# Patient Record
Sex: Male | Born: 1955
Health system: Southern US, Community
[De-identification: ages and names within clinical notes are randomized; demographics above are authoritative.]

## PROBLEM LIST (undated history)

## (undated) ENCOUNTER — Emergency Department: Payer: 59

## (undated) DIAGNOSIS — Z8619 Personal history of other infectious and parasitic diseases: Secondary | ICD-10-CM

## (undated) DIAGNOSIS — I10 Essential (primary) hypertension: Secondary | ICD-10-CM

## (undated) DIAGNOSIS — I509 Heart failure, unspecified: Secondary | ICD-10-CM

## (undated) DIAGNOSIS — I209 Angina pectoris, unspecified: Secondary | ICD-10-CM

## (undated) DIAGNOSIS — R609 Edema, unspecified: Secondary | ICD-10-CM

## (undated) DIAGNOSIS — E785 Hyperlipidemia, unspecified: Secondary | ICD-10-CM

## (undated) DIAGNOSIS — K219 Gastro-esophageal reflux disease without esophagitis: Secondary | ICD-10-CM

## (undated) DIAGNOSIS — Z8701 Personal history of pneumonia (recurrent): Secondary | ICD-10-CM

## (undated) DIAGNOSIS — I639 Cerebral infarction, unspecified: Secondary | ICD-10-CM

## (undated) DIAGNOSIS — F410 Panic disorder [episodic paroxysmal anxiety] without agoraphobia: Secondary | ICD-10-CM

## (undated) DIAGNOSIS — G4733 Obstructive sleep apnea (adult) (pediatric): Secondary | ICD-10-CM

## (undated) DIAGNOSIS — I2510 Atherosclerotic heart disease of native coronary artery without angina pectoris: Secondary | ICD-10-CM

## (undated) DIAGNOSIS — I872 Venous insufficiency (chronic) (peripheral): Secondary | ICD-10-CM

## (undated) DIAGNOSIS — E119 Type 2 diabetes mellitus without complications: Secondary | ICD-10-CM

## (undated) DIAGNOSIS — I878 Other specified disorders of veins: Secondary | ICD-10-CM

## (undated) DIAGNOSIS — J449 Chronic obstructive pulmonary disease, unspecified: Secondary | ICD-10-CM

## (undated) DIAGNOSIS — L0390 Cellulitis, unspecified: Secondary | ICD-10-CM

## (undated) DIAGNOSIS — Z8719 Personal history of other diseases of the digestive system: Secondary | ICD-10-CM

## (undated) DIAGNOSIS — R52 Pain, unspecified: Secondary | ICD-10-CM

## (undated) DIAGNOSIS — R42 Dizziness and giddiness: Secondary | ICD-10-CM

## (undated) DIAGNOSIS — F419 Anxiety disorder, unspecified: Secondary | ICD-10-CM

## (undated) DIAGNOSIS — H3500 Unspecified background retinopathy: Secondary | ICD-10-CM

## (undated) DIAGNOSIS — J45909 Unspecified asthma, uncomplicated: Secondary | ICD-10-CM

## (undated) DIAGNOSIS — F4310 Post-traumatic stress disorder, unspecified: Secondary | ICD-10-CM

## (undated) DIAGNOSIS — I219 Acute myocardial infarction, unspecified: Secondary | ICD-10-CM

## (undated) DIAGNOSIS — Q390 Atresia of esophagus without fistula: Secondary | ICD-10-CM

## (undated) DIAGNOSIS — U071 COVID-19: Secondary | ICD-10-CM

## (undated) DIAGNOSIS — E669 Obesity, unspecified: Secondary | ICD-10-CM

## (undated) HISTORY — DX: Essential (primary) hypertension: I10

## (undated) HISTORY — DX: Obstructive sleep apnea (adult) (pediatric): G47.33

## (undated) HISTORY — PX: CATARACT EXTRACTION: SUR2

## (undated) HISTORY — DX: Panic disorder (episodic paroxysmal anxiety): F41.0

## (undated) HISTORY — DX: Gastro-esophageal reflux disease without esophagitis: K21.9

## (undated) HISTORY — DX: Personal history of other diseases of the digestive system: Z87.19

## (undated) HISTORY — DX: Personal history of pneumonia (recurrent): Z87.01

## (undated) HISTORY — DX: Hyperlipidemia, unspecified: E78.5

## (undated) HISTORY — PX: CORONARY ARTERY BYPASS GRAFT: SHX141

## (undated) HISTORY — DX: Atherosclerotic heart disease of native coronary artery without angina pectoris: I25.10

## (undated) HISTORY — DX: Chronic obstructive pulmonary disease, unspecified: J44.9

## (undated) HISTORY — DX: Anxiety disorder, unspecified: F41.9

## (undated) HISTORY — PX: CARDIAC CATHETERIZATION: SHX172

## (undated) HISTORY — DX: Cellulitis, unspecified: L03.90

## (undated) HISTORY — DX: Obesity, unspecified: E66.9

## (undated) HISTORY — DX: Personal history of other infectious and parasitic diseases: Z86.19

## (undated) HISTORY — DX: Acute myocardial infarction, unspecified: I21.9

## (undated) NOTE — *Deleted (*Deleted)
Reviewed chart for medication changes ahead of medication coordination call.  No OVs, Consults, or hospital visits since last care coordination call/Pharmacist visit.  No medication changes indicated.  BP Readings from Last 3 Encounters:  04/08/20 128/86  03/31/20 (!) 172/96  03/15/20 (!) 150/86    Lab Results  Component Value Date   HGBA1C 6.2 04/08/2020     Recent Relevant Labs: Lab Results  Component Value Date/Time   HGBA1C 6.2 04/08/2020 09:18 AM   HGBA1C 6.3 12/10/2019 08:41 AM   MICROALBUR 80 (H) 09/09/2019 10:47 AM    Kidney Function Lab Results  Component Value Date/Time   CREATININE 0.84 04/08/2020 09:25 AM   CREATININE 0.83 03/01/2020 05:58 PM   CREATININE 1.20 11/29/2014 11:20 AM   CREATININE 1.03 08/27/2014 08:50 AM   GFR 98.05 09/12/2013 10:35 AM   GFRNONAA 92 04/08/2020 09:25 AM   GFRNONAA >60 11/29/2014 11:20 AM   GFRAA 106 04/08/2020 09:25 AM   GFRAA >60 11/29/2014 11:20 AM    . Current antihyperglycemic regimen:  o Blood Glucose Monitoring: Instructed to check blood sugars three to four times daily.  . What recent interventions/DTPs have been made to improve glycemic control:  o *** . Have there been any recent hospitalizations or ED visits since last visit with CPP? {yes/no:20286} . Patient {reports/denies:24182} hypoglycemic symptoms, including {Hypoglycemic Symptoms:3049003} . Patient {reports/denies:24182} hyperglycemic symptoms, including {symptoms; hyperglycemia:17903} . How often are you checking your blood sugar? {BG Testing frequency:23922} . What are your blood sugars ranging?  o Fasting: *** o Before meals: *** o After meals: *** o Bedtime: *** . During the week, how often does your blood glucose drop below 70? {LowBGfrequency:24142} . Are you checking your feet daily/regularly?   Adherence Review: Is the patient currently on a STATIN medication? {yes/no:20286} Is the patient currently on ACE/ARB medication? {yes/no:20286} Does the  patient have >5 day gap between last estimated fill dates? {yes/no:20286}  Blood Pressure 145/90  Dizziness has improved since medication change.  Does report random dizziness but cannot explain a detailed time.  Per patient he has a pulse ox and if his pulse is 80 or below will not check blood pressure. But reporting he does check his blood pressure at least once weekly.  Patient was not on track this morning. Rambling. Could not redirect patient. Continued to explain someone called to attempt to "steal his identity".  Could not give me a phone number or the name of the person who called. The details that were given were scattered. Hard to follow. Instructed patient to never give out any personal information such as social security number. Patient verbalized understanding to information given.   Caregiver comes into the home for two and 1/2 hours daily. She assists with the patients errands or to MD appointments. Reported she does assist home chores such as laundry and cleaning the home.   Continues to say over and over about Hansel Starling the singer. Reported he met Hansel Starling through [employer] Sp[employer]. Explained all of these famous people are related to him ex: calling them cousins.   Patient refuses to go any "mental health" providers because he is not "crazy". Asked patient if he would like someone to call him weekly so he could "vent" to them. He stated " no, I get by with the little help of my friends." Patient is not open to any mental health services.    Reviewed chart prior to disease state call. Spoke with patient regarding BP  Recent Office Vitals: BP Readings  from Last 3 Encounters:  04/08/20 128/86  03/31/20 (!) 172/96  03/15/20 (!) 150/86   Pulse Readings from Last 3 Encounters:  04/08/20 98  03/31/20 64  03/15/20 91    Wt Readings from Last 3 Encounters:  04/08/20 233 lb (105.7 kg)  03/31/20 229 lb 6 oz (104 kg)  03/15/20 230 lb (104.3 kg)     Kidney Function Lab  Results  Component Value Date/Time   CREATININE 0.84 04/08/2020 09:25 AM   CREATININE 0.83 03/01/2020 05:58 PM   CREATININE 1.20 11/29/2014 11:20 AM   CREATININE 1.03 08/27/2014 08:50 AM   GFR 98.05 09/12/2013 10:35 AM   GFRNONAA 92 04/08/2020 09:25 AM   GFRNONAA >60 11/29/2014 11:20 AM   GFRAA 106 04/08/2020 09:25 AM   GFRAA >60 11/29/2014 11:20 AM    BMP Latest Ref Rng & Units 04/08/2020 03/01/2020 12/10/2019  Glucose 65 - 99 mg/dL 161(W) 960(A) 540(J)  BUN 8 - 27 mg/dL 17 15 13   Creatinine 0.76 - 1.27 mg/dL 8.11 9.14 7.82  BUN/Creat Ratio 10 - 24 20 - 16  Sodium 134 - 144 mmol/L 138 137 143  Potassium 3.5 - 5.2 mmol/L 5.3(H) 4.0 4.1  Chloride 96 - 106 mmol/L 102 104 108(H)  CO2 20 - 29 mmol/L 21 22 20   Calcium 8.6 - 10.2 mg/dL 9.6 9.2 9.6    . Current antihypertensive regimen:  o *** . How often are you checking your Blood Pressure? {CHL HP BP Monitoring Frequency:830 [id number] 0486} . Current home BP readings: *** . What recent interventions/DTPs have been made by any provider to improve Blood Pressure control since last CPP Visit: *** . Any recent hospitalizations or ED visits since last visit with CPP? {yes/no:20286} . What diet changes have been made to improve Blood Pressure Control?  o *** . What exercise is being done to improve your Blood Pressure Control?  o ***  Adherence Review: Is the patient currently on ACE/ARB medication? {yes/no:20286} Does the patient have >5 day gap between last estimated fill dates? {yes/no:20286}   Amanda L Caudle Amanda L Caudle

---

## 1997-08-07 HISTORY — PX: CORONARY ANGIOPLASTY WITH STENT PLACEMENT: SHX49

## 1999-03-18 ENCOUNTER — Emergency Department (HOSPITAL_COMMUNITY): Admission: EM | Admit: 1999-03-18 | Discharge: 1999-03-18 | Payer: Self-pay | Admitting: *Deleted

## 1999-05-03 ENCOUNTER — Encounter: Payer: Self-pay | Admitting: Cardiology

## 1999-05-03 ENCOUNTER — Emergency Department (HOSPITAL_COMMUNITY): Admission: EM | Admit: 1999-05-03 | Discharge: 1999-05-03 | Payer: Self-pay | Admitting: Emergency Medicine

## 2000-02-22 ENCOUNTER — Emergency Department (HOSPITAL_COMMUNITY): Admission: EM | Admit: 2000-02-22 | Discharge: 2000-02-22 | Payer: Self-pay | Admitting: Emergency Medicine

## 2000-02-22 ENCOUNTER — Encounter: Payer: Self-pay | Admitting: Emergency Medicine

## 2000-08-07 HISTORY — PX: CORONARY ANGIOPLASTY WITH STENT PLACEMENT: SHX49

## 2000-08-14 ENCOUNTER — Encounter: Payer: Self-pay | Admitting: Emergency Medicine

## 2000-08-14 ENCOUNTER — Emergency Department (HOSPITAL_COMMUNITY): Admission: EM | Admit: 2000-08-14 | Discharge: 2000-08-14 | Payer: Self-pay | Admitting: Emergency Medicine

## 2000-09-21 ENCOUNTER — Inpatient Hospital Stay (HOSPITAL_COMMUNITY): Admission: EM | Admit: 2000-09-21 | Discharge: 2000-09-22 | Payer: Self-pay | Admitting: Emergency Medicine

## 2000-09-22 ENCOUNTER — Encounter: Payer: Self-pay | Admitting: *Deleted

## 2000-09-22 ENCOUNTER — Observation Stay (HOSPITAL_COMMUNITY): Admission: EM | Admit: 2000-09-22 | Discharge: 2000-09-24 | Payer: Self-pay | Admitting: *Deleted

## 2001-07-18 ENCOUNTER — Encounter: Payer: Self-pay | Admitting: Emergency Medicine

## 2001-07-18 ENCOUNTER — Inpatient Hospital Stay (HOSPITAL_COMMUNITY): Admission: EM | Admit: 2001-07-18 | Discharge: 2001-07-19 | Payer: Self-pay | Admitting: Emergency Medicine

## 2001-08-20 ENCOUNTER — Emergency Department (HOSPITAL_COMMUNITY): Admission: EM | Admit: 2001-08-20 | Discharge: 2001-08-20 | Payer: Self-pay

## 2001-08-20 ENCOUNTER — Encounter: Payer: Self-pay | Admitting: Emergency Medicine

## 2002-04-14 ENCOUNTER — Emergency Department (HOSPITAL_COMMUNITY): Admission: EM | Admit: 2002-04-14 | Discharge: 2002-04-14 | Payer: Self-pay | Admitting: *Deleted

## 2002-04-14 ENCOUNTER — Encounter: Payer: Self-pay | Admitting: *Deleted

## 2003-04-02 ENCOUNTER — Emergency Department (HOSPITAL_COMMUNITY): Admission: AD | Admit: 2003-04-02 | Discharge: 2003-04-02 | Payer: Self-pay | Admitting: Emergency Medicine

## 2004-03-29 ENCOUNTER — Other Ambulatory Visit: Payer: Self-pay

## 2005-03-29 ENCOUNTER — Ambulatory Visit: Payer: Self-pay | Admitting: Cardiology

## 2005-06-20 ENCOUNTER — Emergency Department: Payer: Self-pay | Admitting: Internal Medicine

## 2005-06-20 ENCOUNTER — Other Ambulatory Visit: Payer: Self-pay

## 2005-06-20 IMAGING — CR DG CHEST 1V PORT
1 series · 1 of 1 positions shown · non-contrast
Comparison: none

REASON FOR EXAM: Chest pain
COMMENTS:

PROCEDURE:     DXR - DXR PORTABLE CHEST SINGLE VIEW  - [DATE]  [DATE]
RESULT:          AP view of the chest is compared to a prior exam of [DATE].
The lung fields are clear.  No pneumonia, pneumothorax, or pleural effusion
is seen.  Heart size is normal.

[view not recorded]
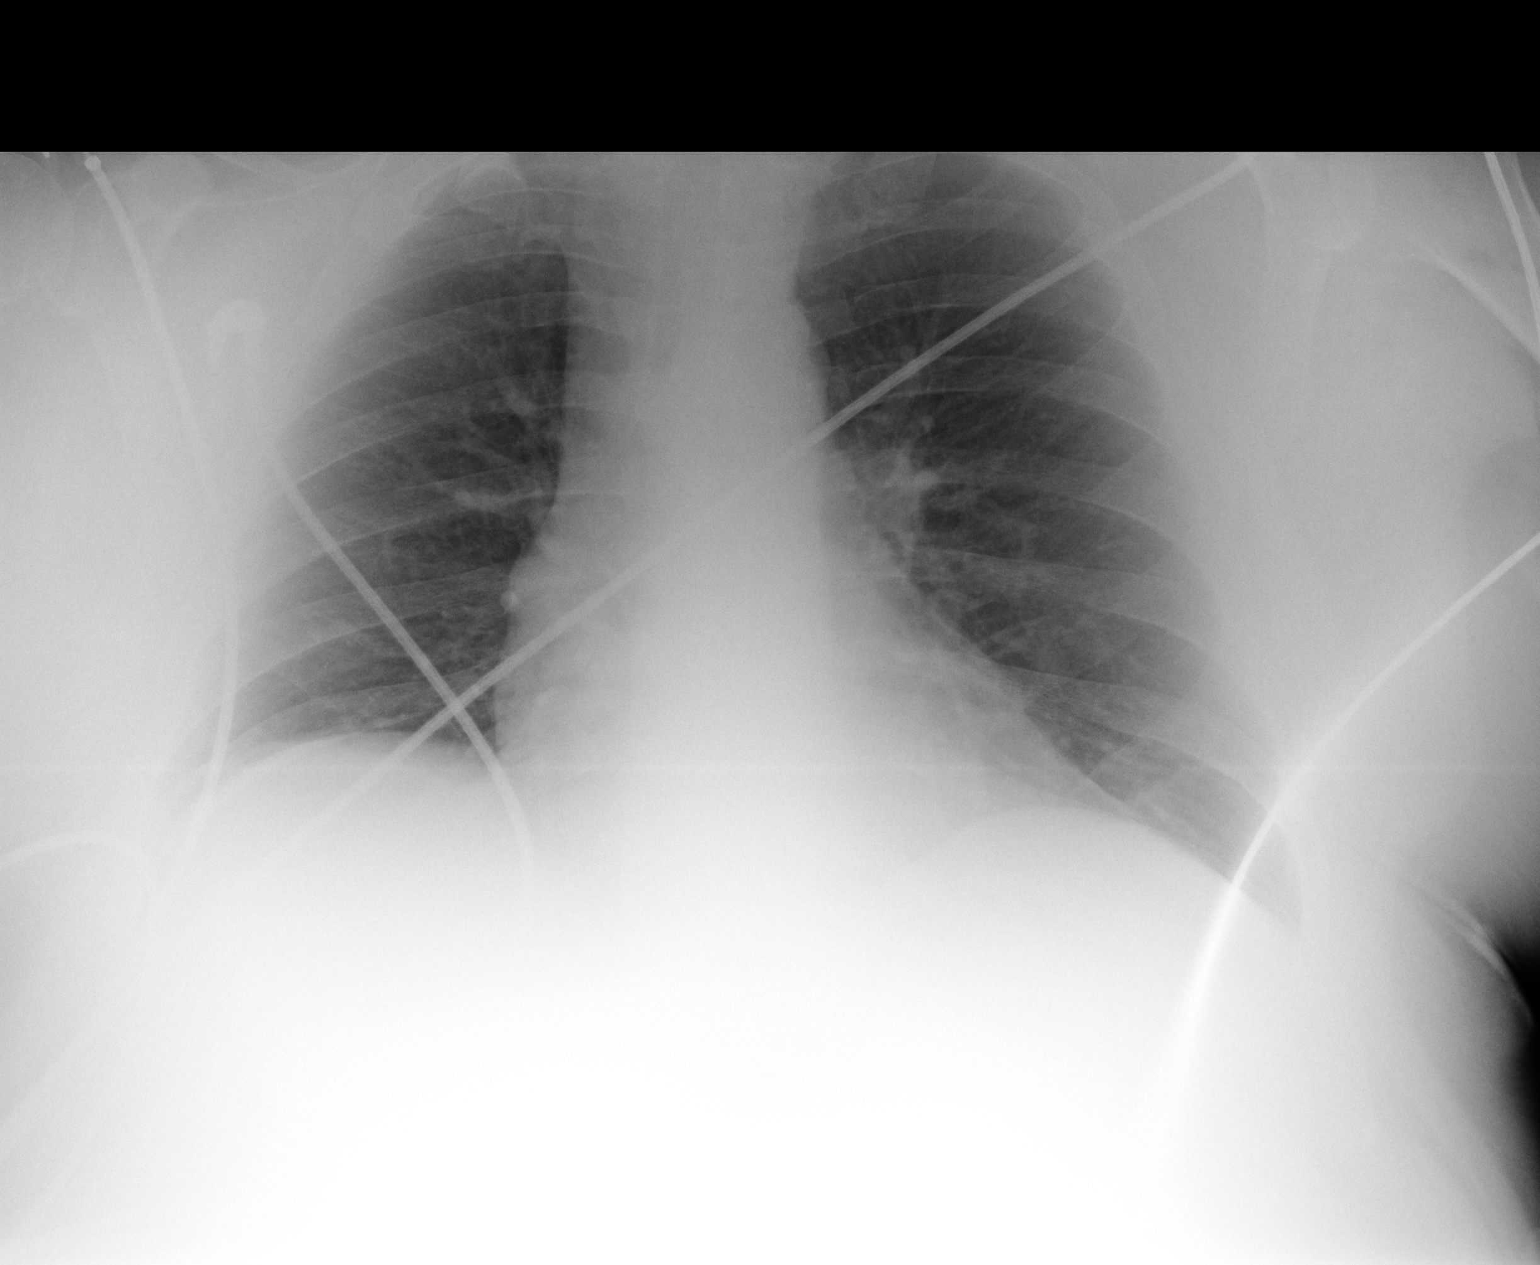

[1 of 1 positions shown; findings below may reference images not displayed]

IMPRESSION: No acute changes are identified.

## 2005-11-30 ENCOUNTER — Other Ambulatory Visit: Payer: Self-pay

## 2005-11-30 ENCOUNTER — Emergency Department: Payer: Self-pay | Admitting: Emergency Medicine

## 2005-11-30 IMAGING — CR DG CHEST 2V
1 series · 2 of 2 positions shown · non-contrast
Comparison: none

REASON FOR EXAM: Shortness of breath
COMMENTS:

PROCEDURE:     DXR - DXR CHEST PA (OR AP) AND LATERAL  - [DATE]  [DATE]
RESULT:        The lungs are adequately inflated.  There is no focal
infiltrate.  The interstitial markings are mildly prominent.  The heart is
not enlarged and there is no pleural effusion.

[Series 1: view not recorded · 0.17mm/px · 2 of 2 slices shown]
[im 1/2]
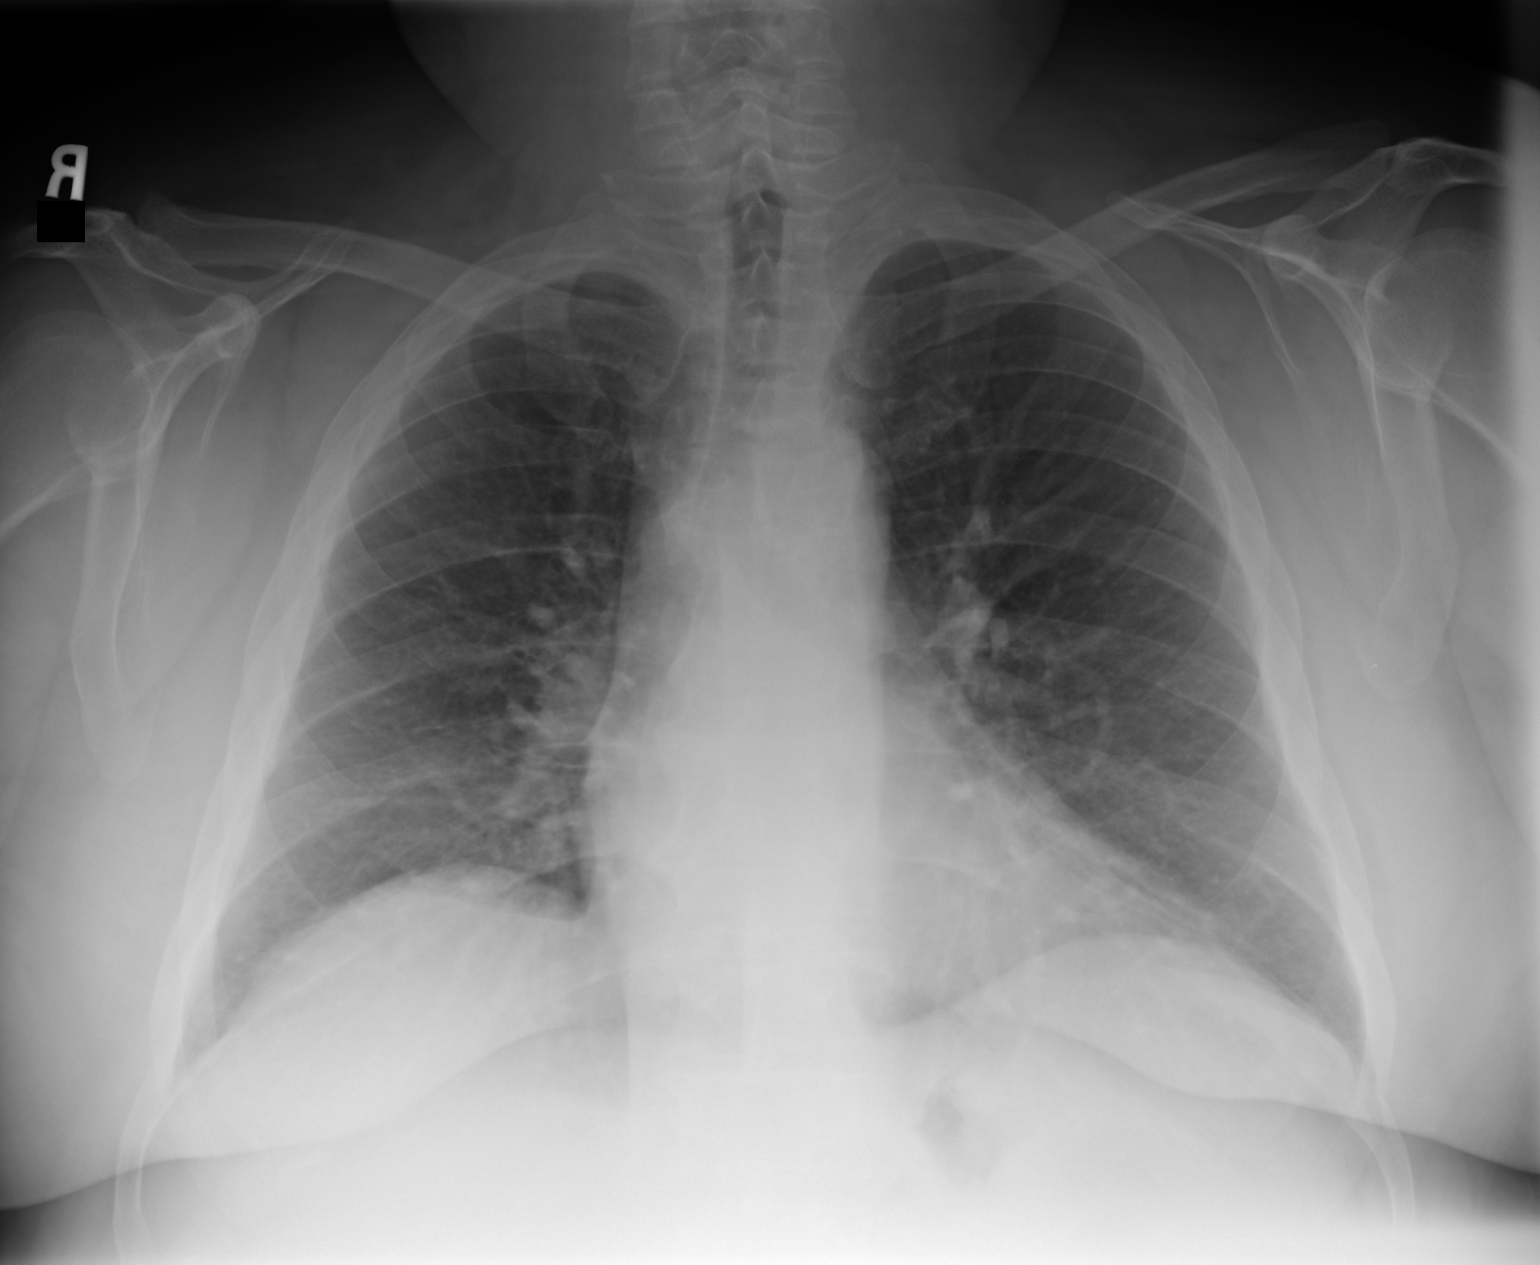
[im 2/2]
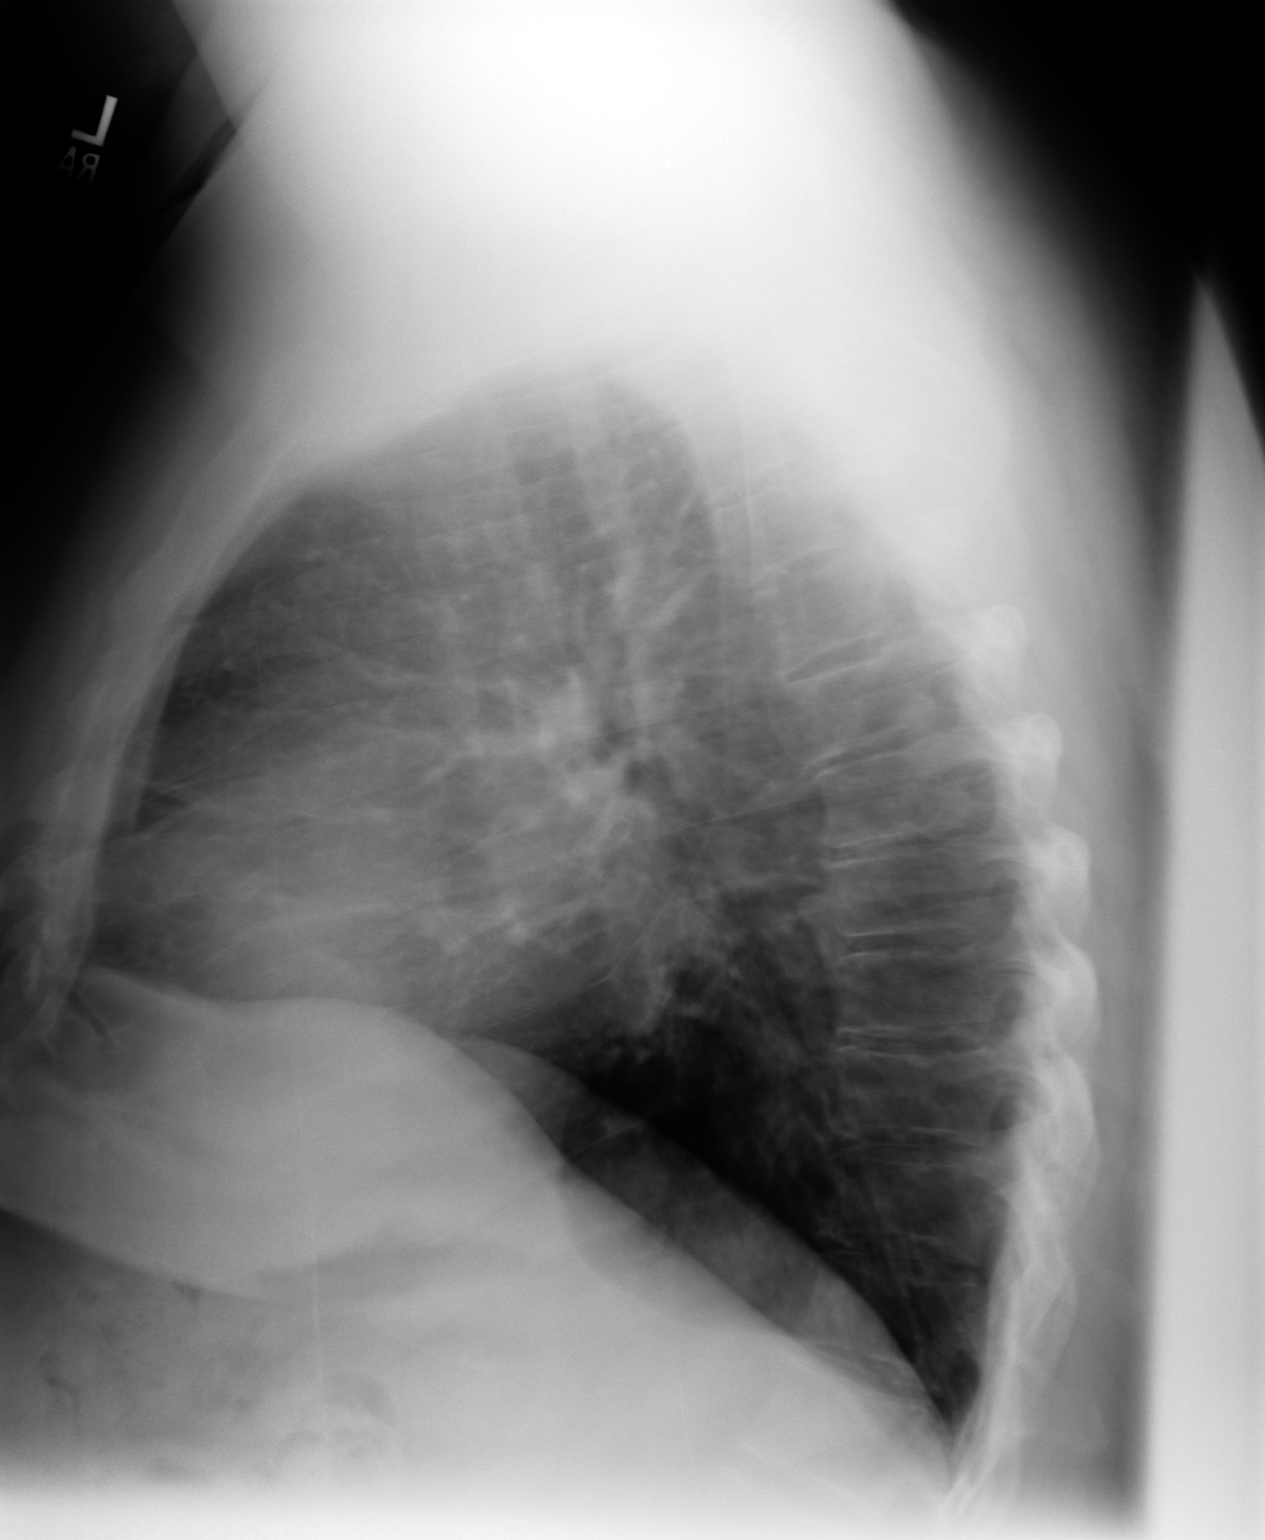

[2 of 2 positions shown; findings below may reference images not displayed]

IMPRESSION: I do not see evidence of acute cardiopulmonary disease.  I
cannot exclude an element of acute bronchitis.

## 2005-12-06 ENCOUNTER — Emergency Department: Payer: Self-pay | Admitting: Emergency Medicine

## 2006-05-25 ENCOUNTER — Ambulatory Visit: Payer: Self-pay | Admitting: Cardiology

## 2006-06-11 ENCOUNTER — Emergency Department: Payer: Self-pay | Admitting: Emergency Medicine

## 2006-06-11 ENCOUNTER — Other Ambulatory Visit: Payer: Self-pay

## 2006-06-11 IMAGING — CT CT HEAD WITHOUT CONTRAST
2 series · 16 of 30 positions shown, 20 images · non-contrast
Comparison: none

REASON FOR EXAM: syncope//rm 18
COMMENTS:  LMP: (Male)

[Series 2: without · axial · non-contrast · 0.40mm/px · z∈[+106,+232]mm · 13 of 31 slices shown, 17 images]
[im 3/31  brain]
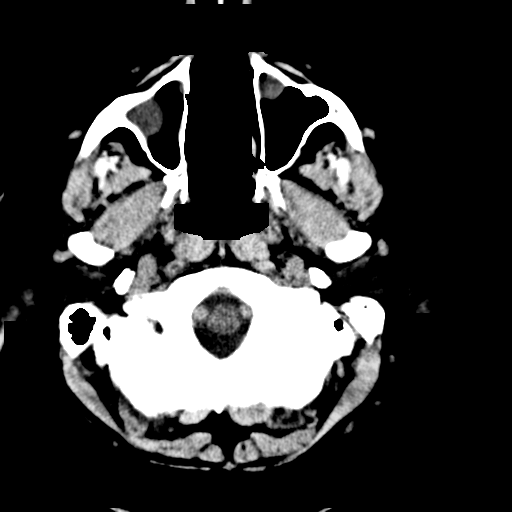
[im 3/31  bone]
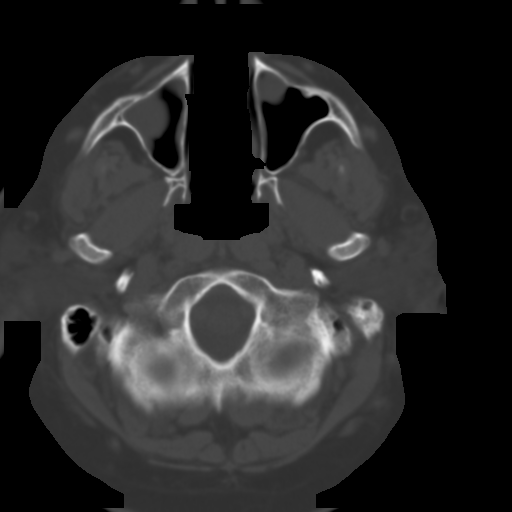
[im 5/31  brain]
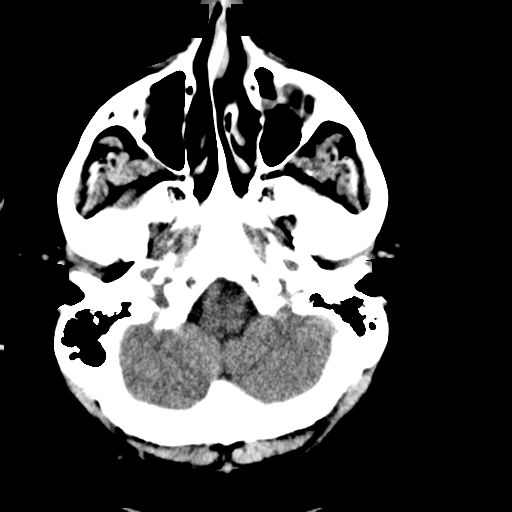
[im 7/31  brain]
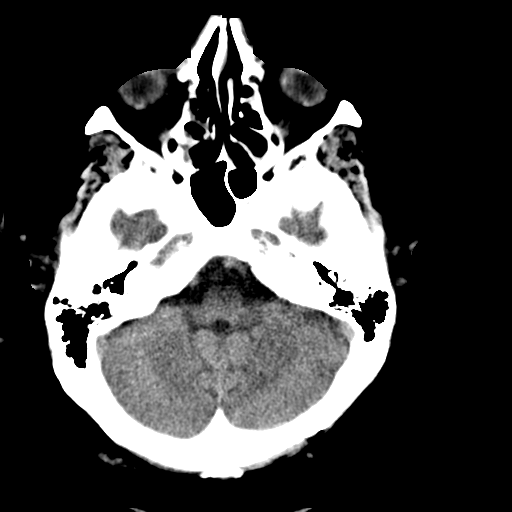
[im 9/31  brain]
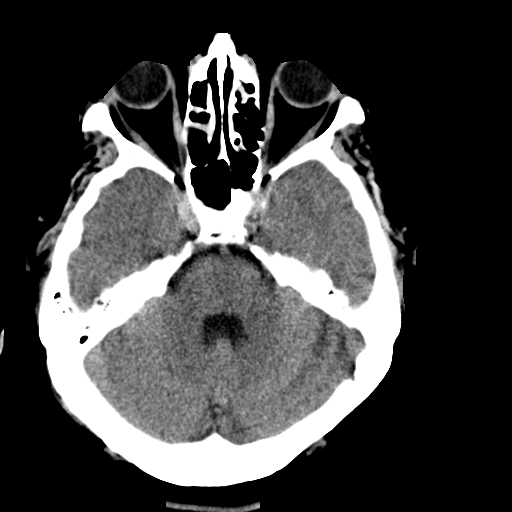
[im 11/31  brain]
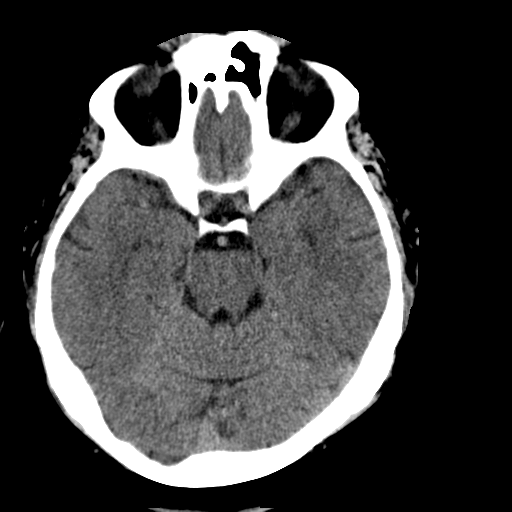
[im 11/31  bone]
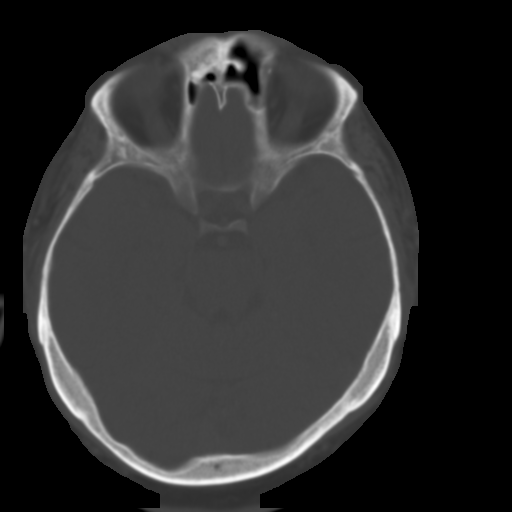
[im 13/31  brain]
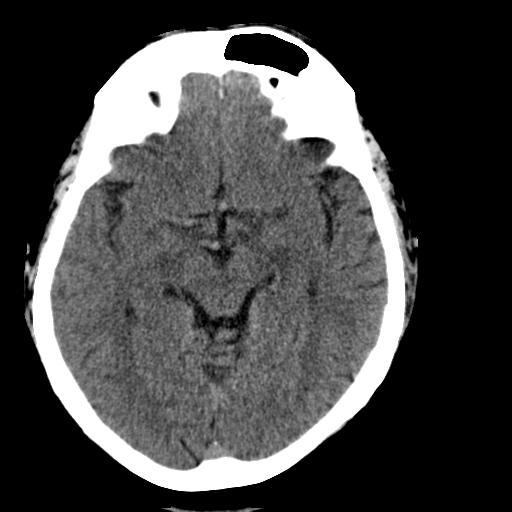
[im 16/31  brain]
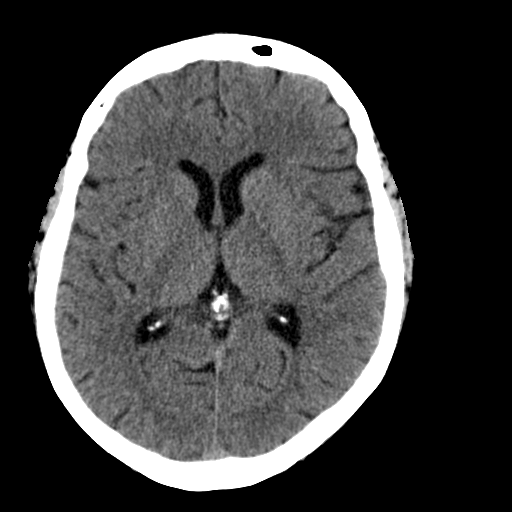
[im 18/31  brain]
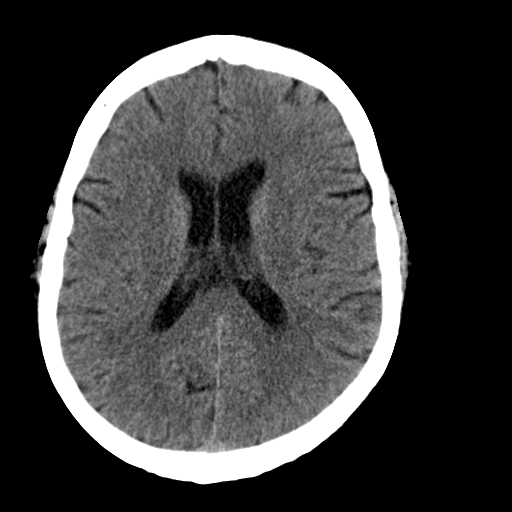
[im 20/31  brain]
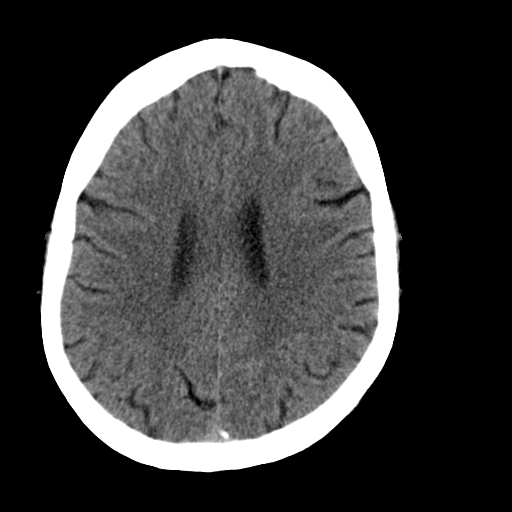
[im 20/31  bone]
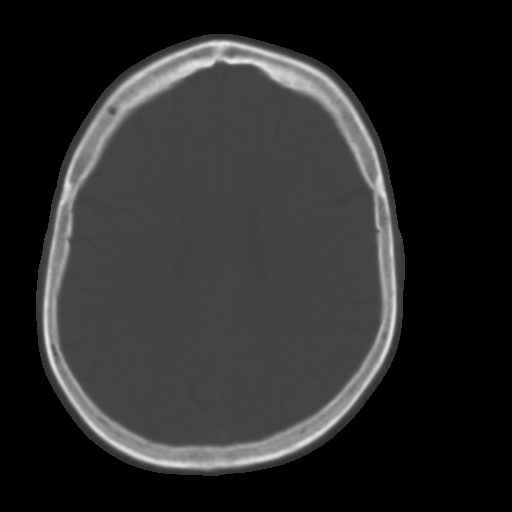
[im 22/31  brain]
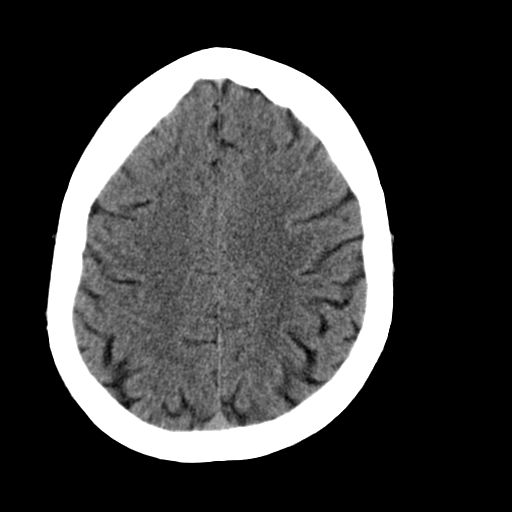
[im 24/31  brain]
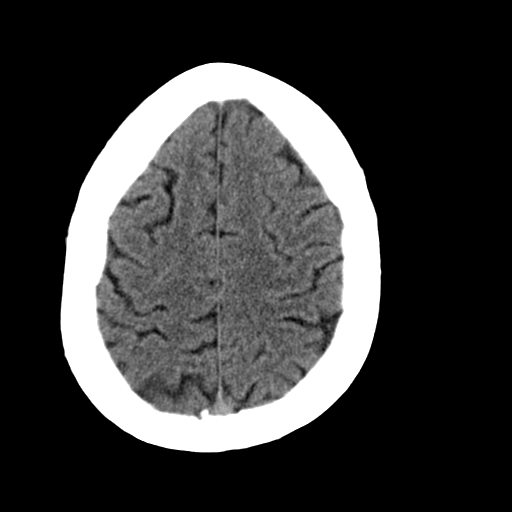
[im 26/31  brain]
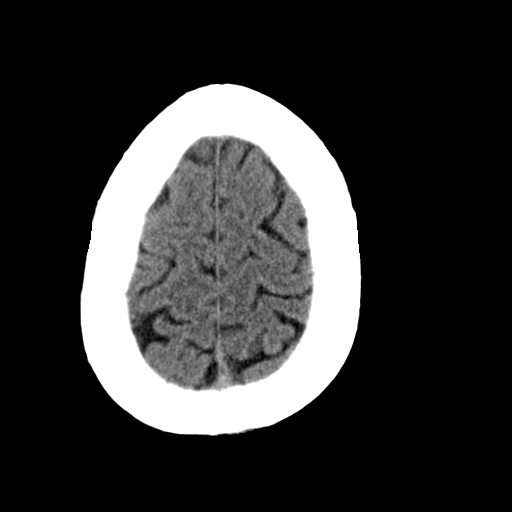
[im 28/31  brain]
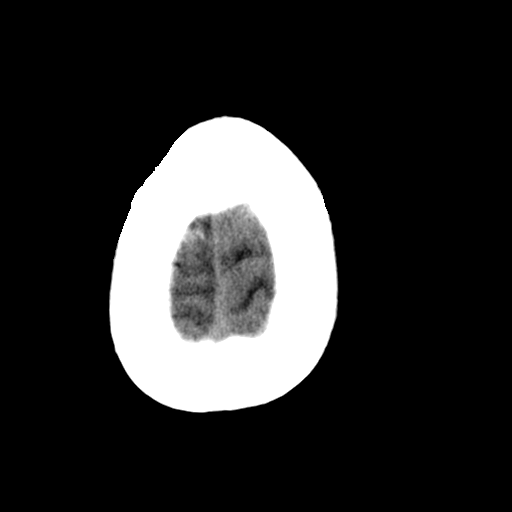
[im 28/31  bone]
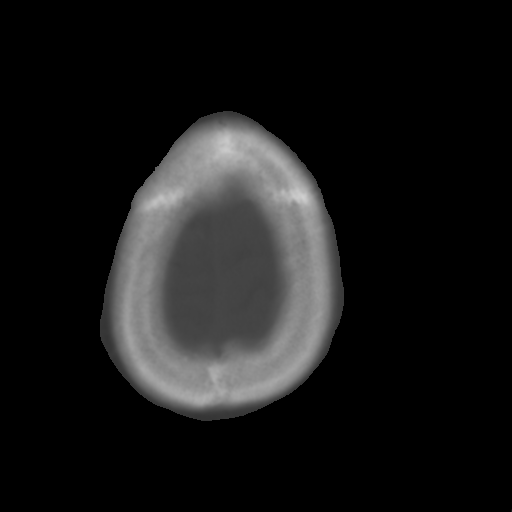

[Series 3: bone · axial · 0.40mm/px · z∈[+106,+146]mm · 3 of 31 slices shown]
[im 3/31  bone]
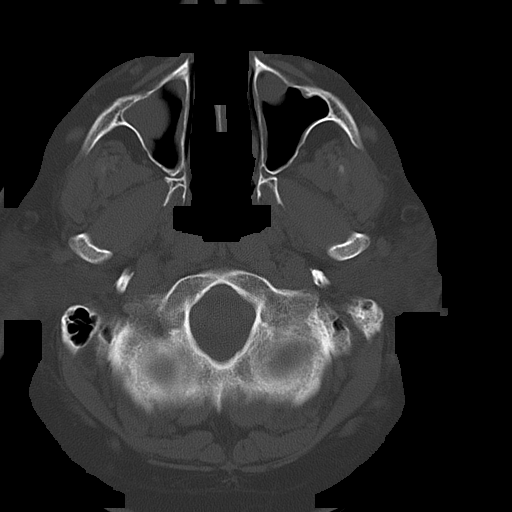
[im 7/31  bone]
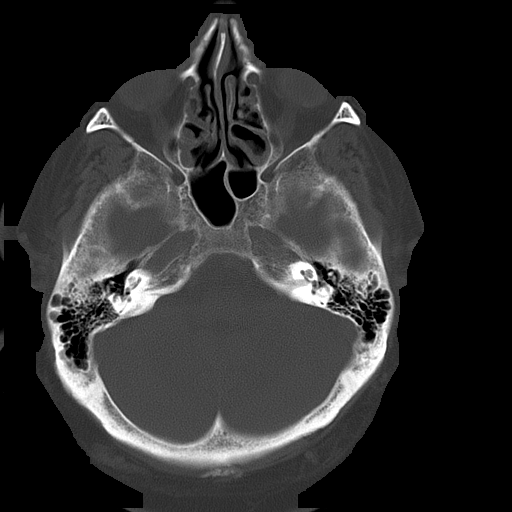
[im 11/31  bone]
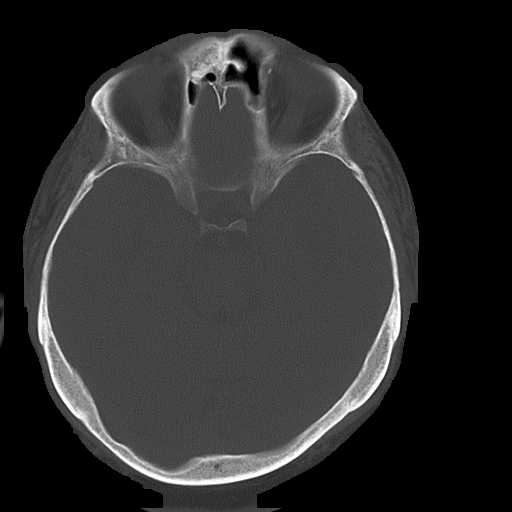

[16 of 30 positions shown; findings below may reference images not displayed]

PROCEDURE:     CT  - CT HEAD WITHOUT CONTRAST  - [DATE] [DATE]

RESULT:     The patient has unexplained dizziness.

The ventricles are normal in size and position. There is no intracranial
hemorrhage, mass, or mass-effect. At bone window settings there is some
mucoperiosteal thickening within the maxillary and ethmoid sinuses
bilaterally. I see no air-fluid levels. There is no lytic or blastic bony
lesion and no evidence of an acute skull fracture.
IMPRESSION: I do not see acute intracranial abnormality. There are
findings that likely reflect maxillary and ethmoid sinus inflammation.

## 2006-06-19 ENCOUNTER — Ambulatory Visit: Payer: Self-pay | Admitting: Cardiology

## 2006-07-23 ENCOUNTER — Emergency Department: Payer: Self-pay | Admitting: Emergency Medicine

## 2006-09-21 IMAGING — CR DG CHEST 1V PORT
1 series · 1 of 1 positions shown · non-contrast
Comparison: none

REASON FOR EXAM: Syncope//rm 18
COMMENTS:  LMP: (Male)

PROCEDURE:     DXR - DXR PORTABLE CHEST SINGLE VIEW  - [DATE] [DATE]
RESULT:        AP view of the chest shows the lung fields to be clear.  No
definite abnormalities of the heart, lungs or mediastinal structures are
identified.  Monitoring electrodes are present.

[view not recorded]
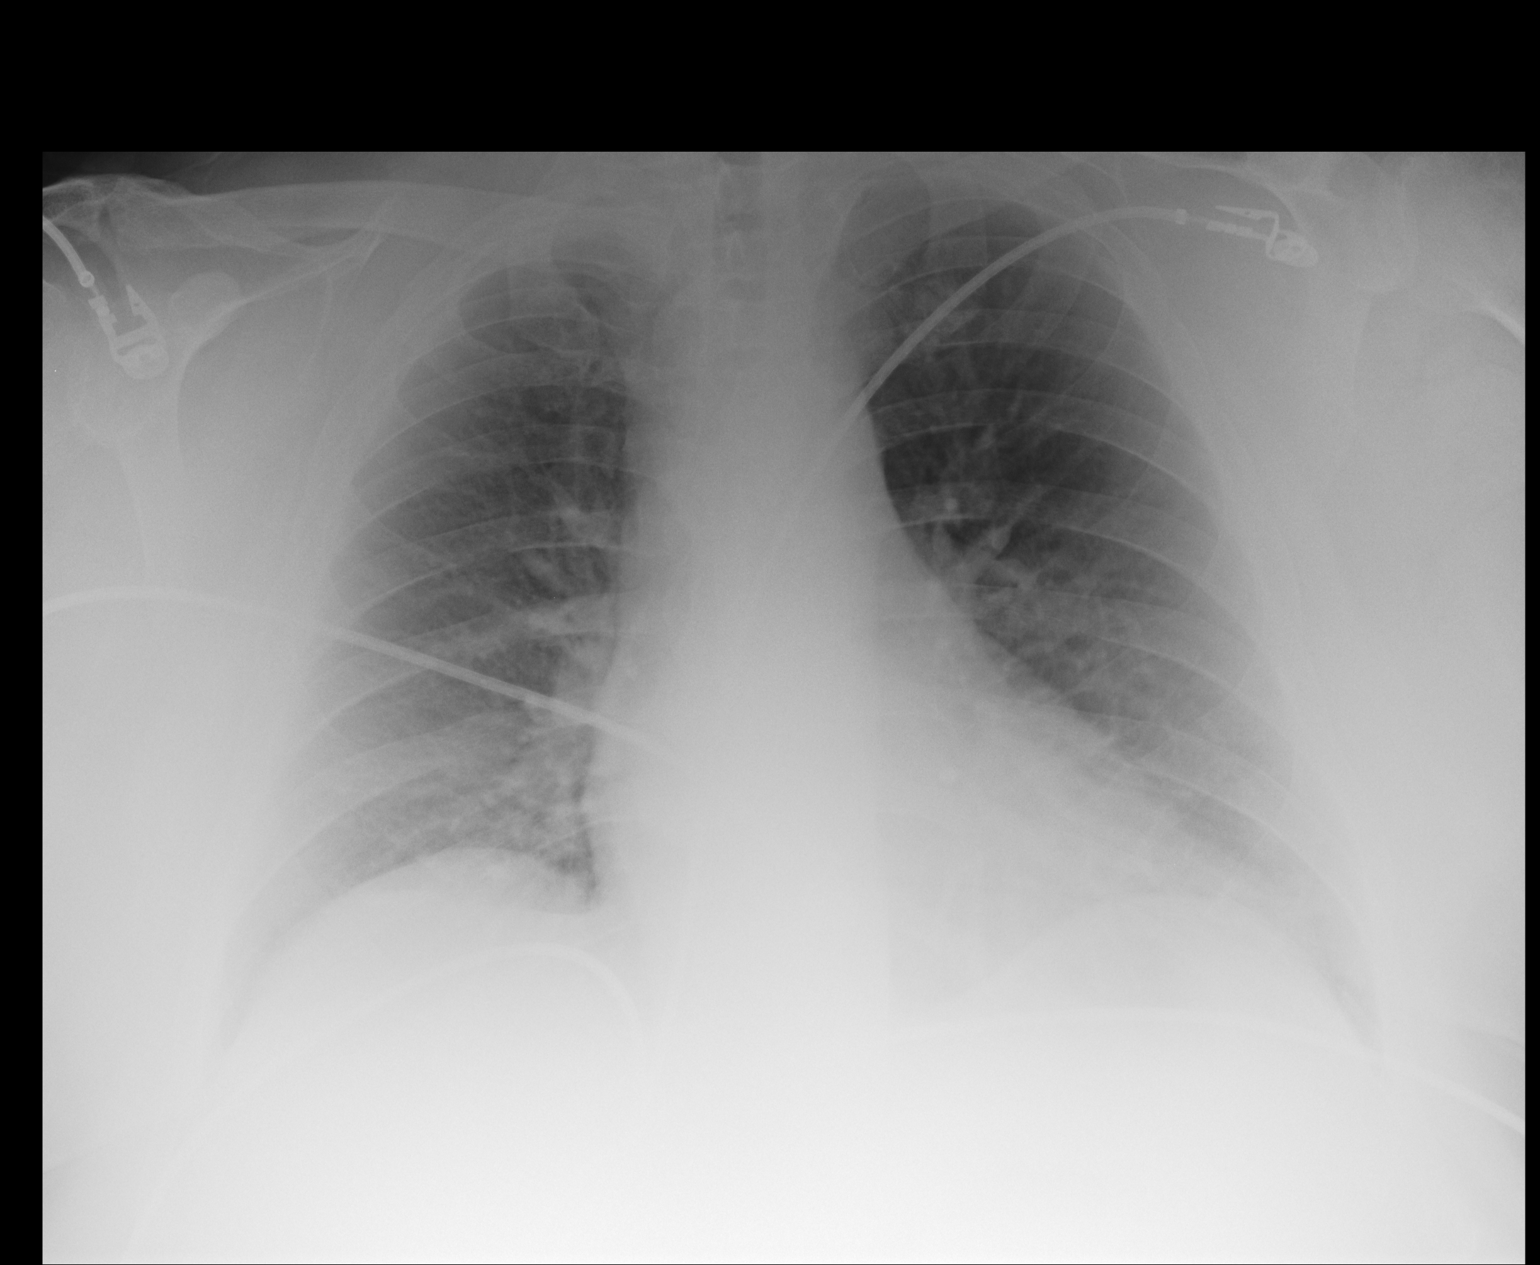

[1 of 1 positions shown; findings below may reference images not displayed]

IMPRESSION: No acute changes are identified.

## 2007-01-09 ENCOUNTER — Other Ambulatory Visit: Payer: Self-pay

## 2007-01-09 ENCOUNTER — Emergency Department: Payer: Self-pay | Admitting: Emergency Medicine

## 2007-01-09 IMAGING — CR DG CHEST 1V PORT
1 series · 1 of 1 positions shown · non-contrast
Comparison: none

REASON FOR EXAM: Chest Pain
COMMENTS:

[view not recorded]
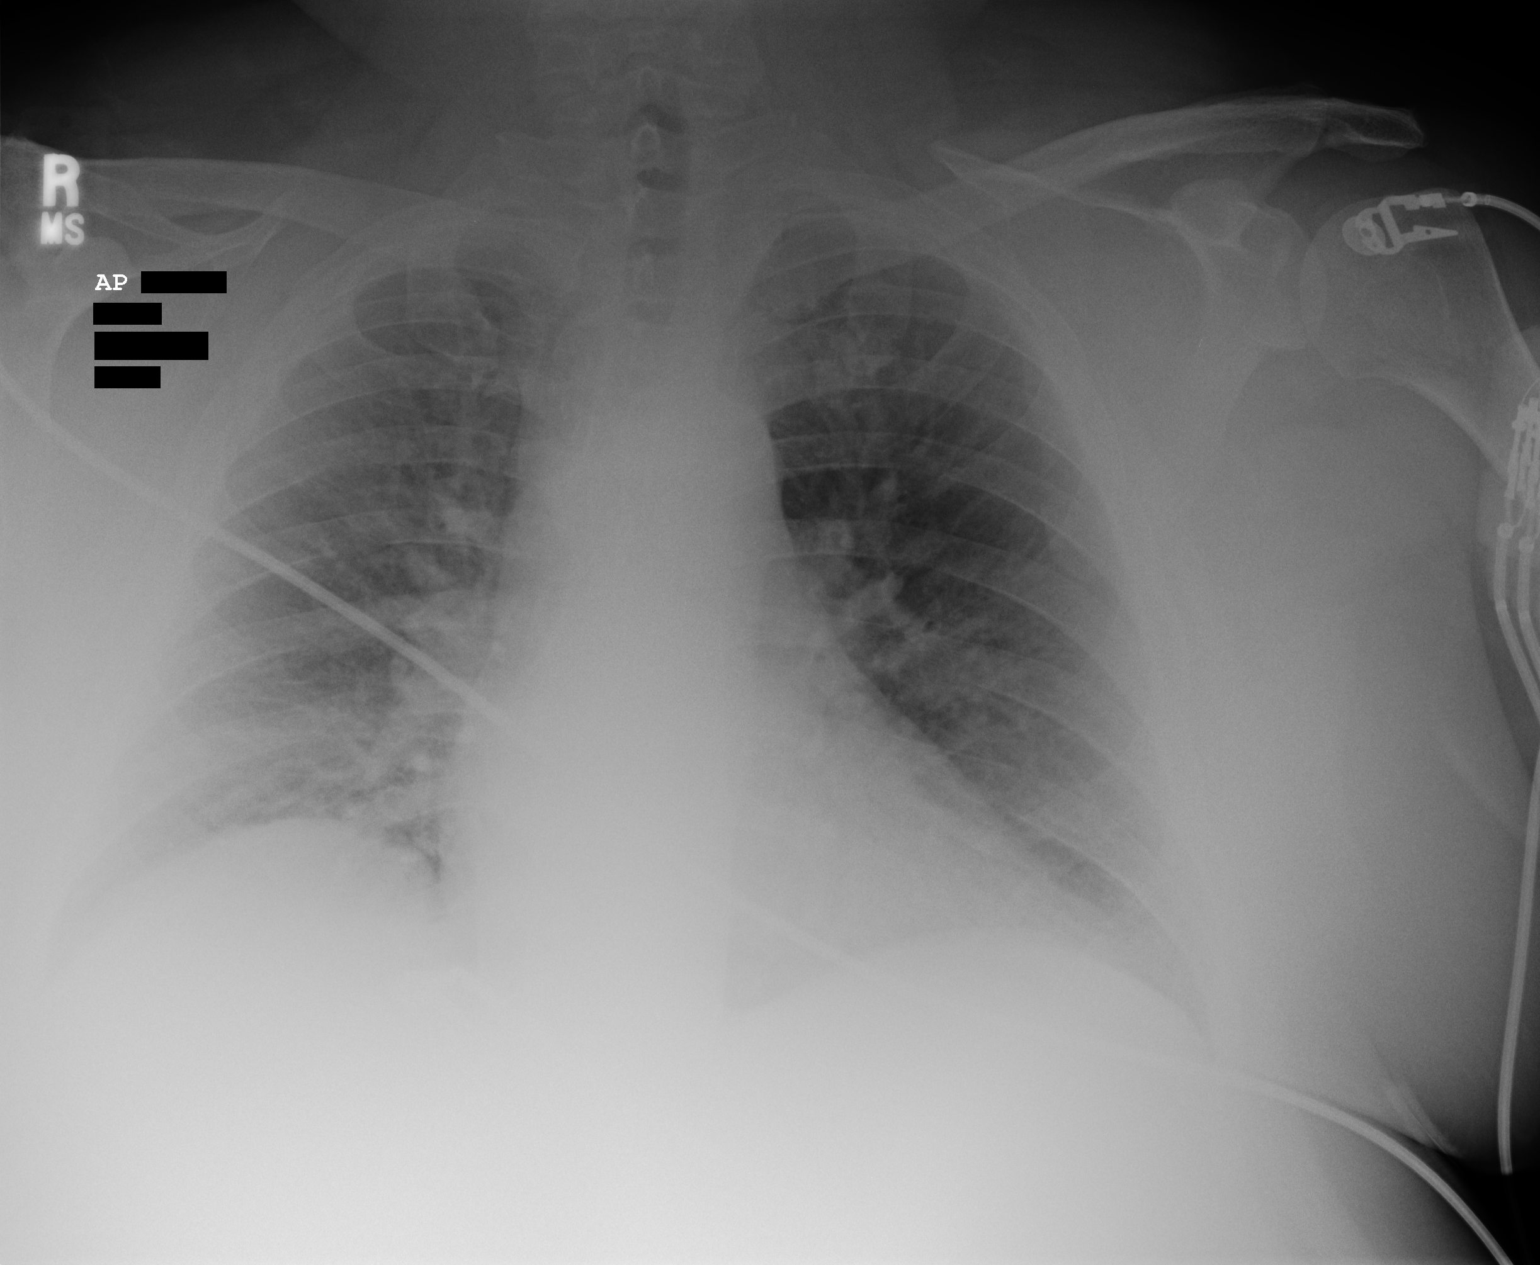

[1 of 1 positions shown; findings below may reference images not displayed]

PROCEDURE:     DXR - DXR PORTABLE CHEST SINGLE VIEW  - [DATE] [DATE]

RESULT:     Comparison is made to the prior exam of [DATE]. The lung
fields are clear. No acute changes of the heart, mediastinal or osseous
structures are identified. The heart size remains normal. Monitoring
electrodes are present.
IMPRESSION: No acute changes are identified.

## 2007-03-16 ENCOUNTER — Inpatient Hospital Stay: Payer: Self-pay | Admitting: Internal Medicine

## 2007-03-16 ENCOUNTER — Ambulatory Visit: Payer: Self-pay | Admitting: Cardiology

## 2007-03-16 ENCOUNTER — Other Ambulatory Visit: Payer: Self-pay

## 2007-03-16 IMAGING — CT CT CHEST-ABD-PELV W/ CM
1 of 3 series · 14 of 29 positions shown, 19 images · IV contrast (APPLIED)
Comparison: none

REASON FOR EXAM: (1) IV CONTRAST ONLY; (2) CP RAD INTO BACK; EVAL
DISSECTION
COMMENTS:

PROCEDURE:     CT  - CT CHEST ABDOMEN AND PELVIS W  - [DATE]  [DATE]
RESULT:     Comparison: Chest radiograph on [DATE].
TECHNIQUE: CT examination of the chest, abdomen and pelvis was performed
after intravenous administration of 125 cc of [GY] nonionic contrast
in addition to oral contrast. Collimation is 3 mm.

[Series 6: soft tissue · axial · 0.96mm/px · z∈[-346,+188]mm · 14 of 202 slices shown, 19 images]
[im 12/202  mediastinal]
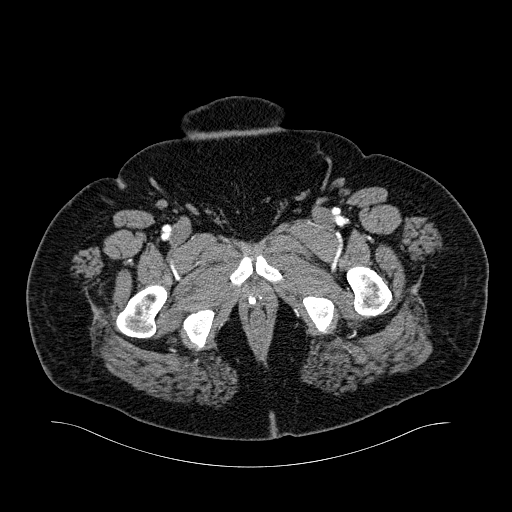
[im 12/202  bone]
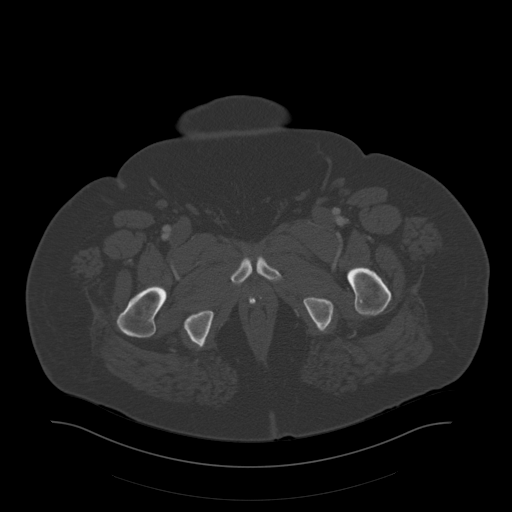
[im 34/202  mediastinal]
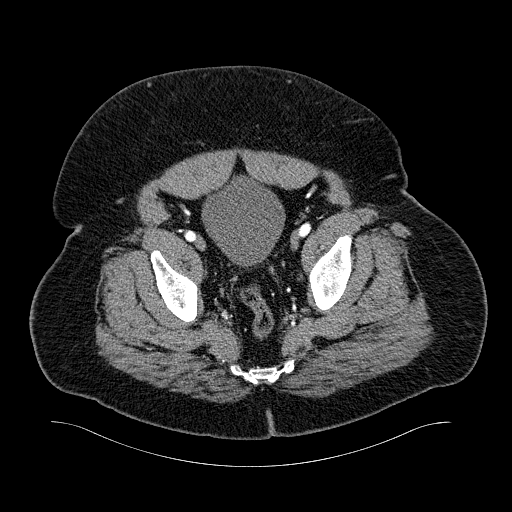
[im 45/202  mediastinal]
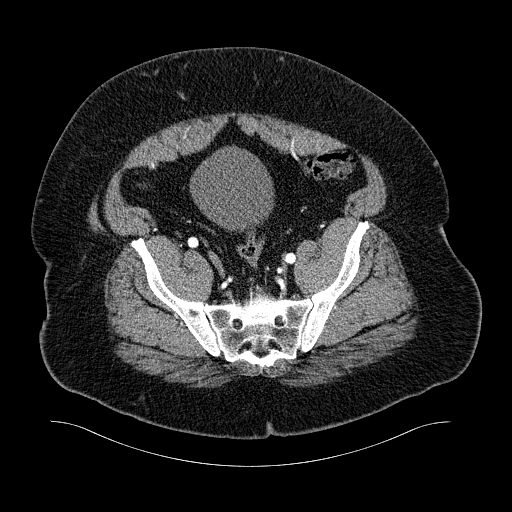
[im 56/202  mediastinal]
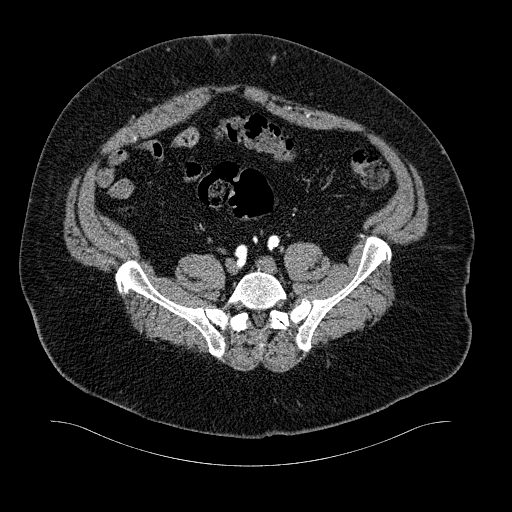
[im 79/202  mediastinal]
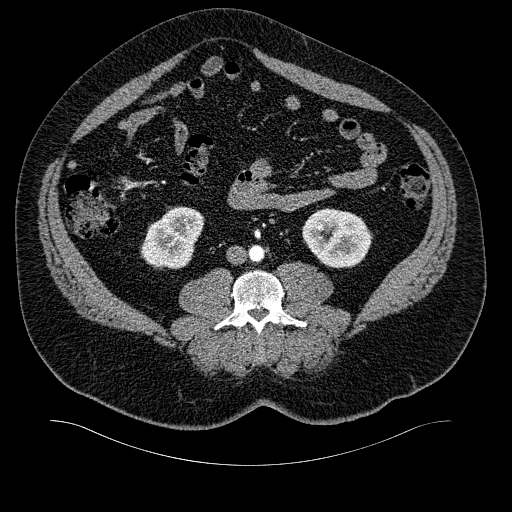
[im 90/202  mediastinal]
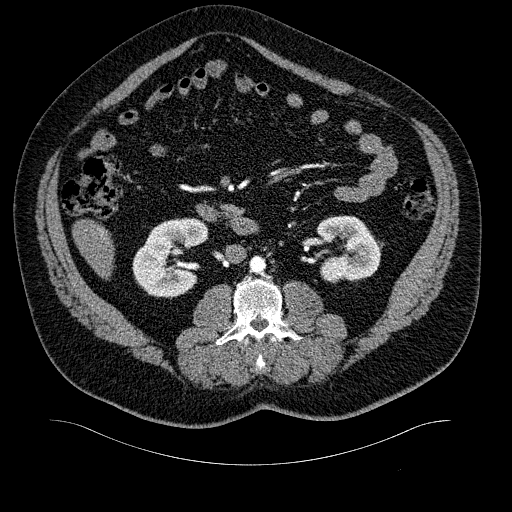
[im 101/202  mediastinal]
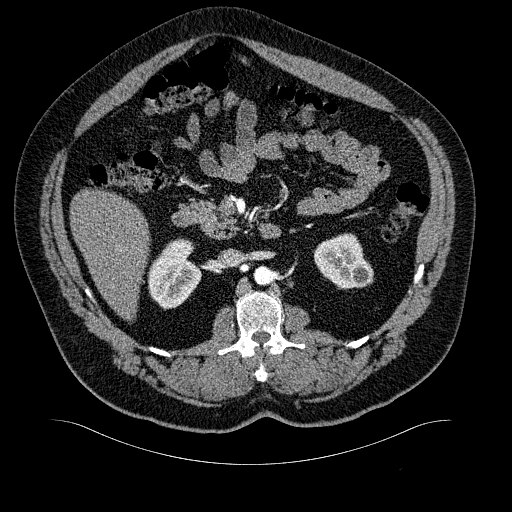
[im 112/202  mediastinal]
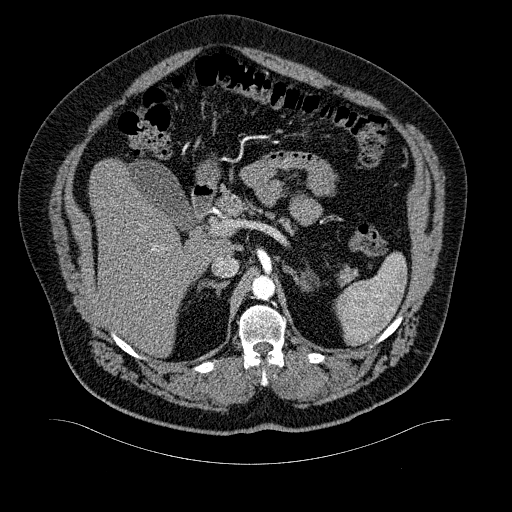
[im 123/202  mediastinal]
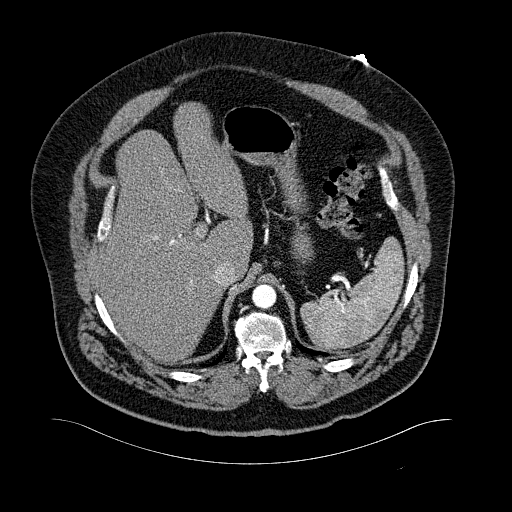
[im 123/202  bone]
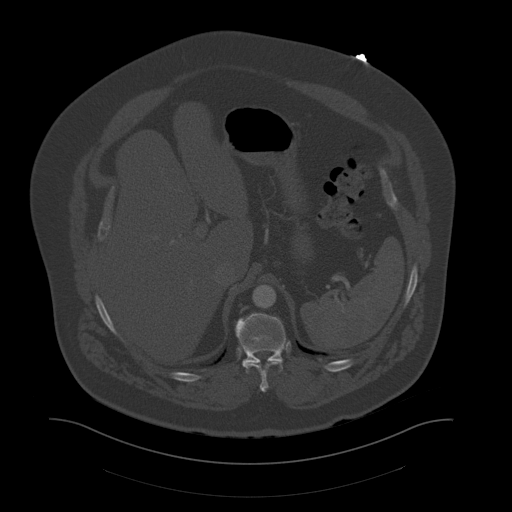
[im 146/202  mediastinal]
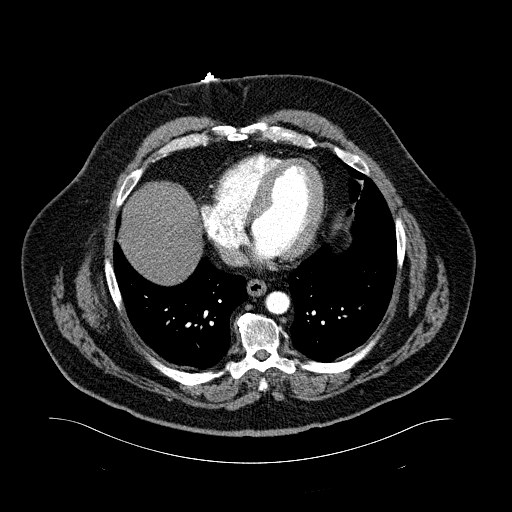
[im 157/202  mediastinal]
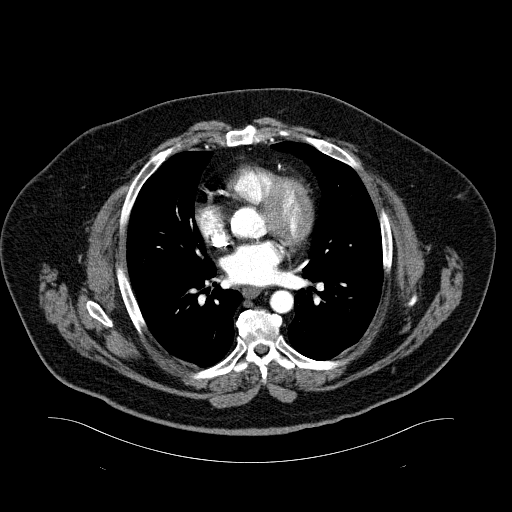
[im 157/202  lung]
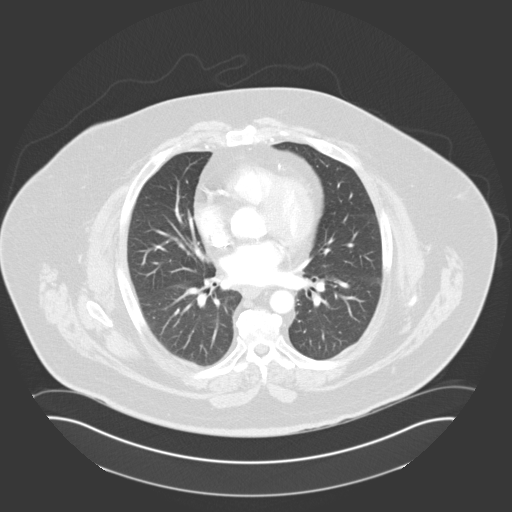
[im 168/202  mediastinal]
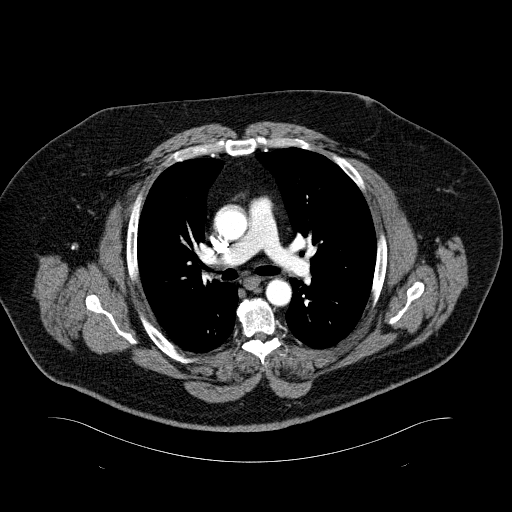
[im 168/202  lung]
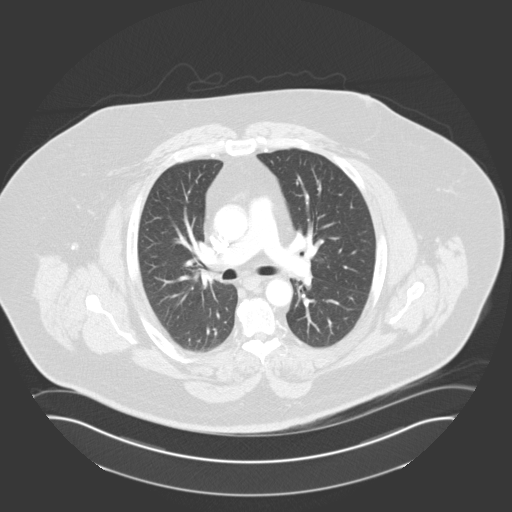
[im 179/202  lung]
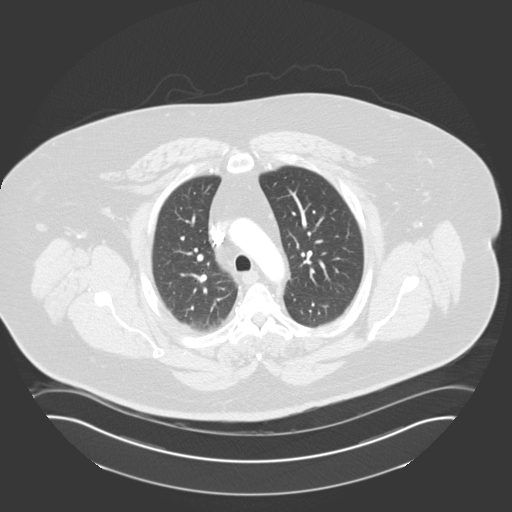
[im 190/202  mediastinal]
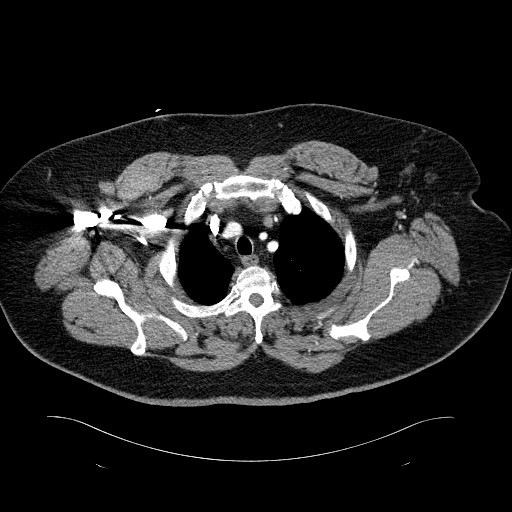
[im 190/202  lung]
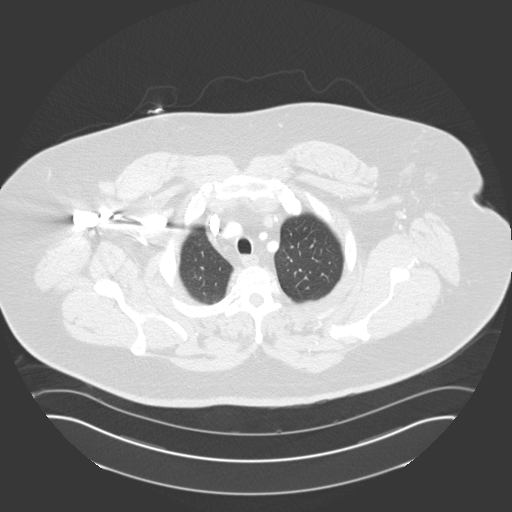

[14 of 29 positions shown; findings below may reference images not displayed]

FINDINGS: Chest:

The lungs are clear. There is no pleural or pericardial effusion. The heart
is normal in size. There are no enlarged mediastinal, hilar, nor axillary
lymph nodes. There is no evidence of pulmonary embolism. There is no
thoracic aortic aneurysm or dissection.

Abdomen and pelvis:

The liver, gallbladder, spleen, pancreas, adrenal glands, and right kidney
are unremarkable. A small splenule is noted. A 1.5 cm left upper pole renal
cyst is seen.

There is no abdominal aortic aneurysm or dissection. The celiac, superior
mesenteric, and inferior mesenteric arteries are patent. There are 2 right
renal arteries.

There is no dilatation or definite wall thickening of the  bowels. The
appendix is unremarkable. There is no significant intra abdominal/pelvic fat
stranding. There is no intraperitoneal free air. There is no significant
free fluid. There are no enlarged abdominal pelvic lymph nodes.

There is an indeterminate 1 cm lucent area involving the right aspect of the
T10 vertebral body.
IMPRESSION: 1. Very mild atherosclerotic changes of the aorta. No evidence of aortic
aneurysm or dissection. No evidence of pulmonary embolism.
2. Two right renal arteries are noted.
3. There is an indeterminate 1 cm lucent area involving the right aspect of
the T10 vertebral body. If there is clinical concern for malignancy, this
can be further evaluated with MRI.

Preliminary report was made available to the clinical service by the night
radiologist shortly after the study was performed.

## 2007-03-17 ENCOUNTER — Other Ambulatory Visit: Payer: Self-pay

## 2007-08-08 DIAGNOSIS — I219 Acute myocardial infarction, unspecified: Secondary | ICD-10-CM

## 2007-08-08 HISTORY — DX: Acute myocardial infarction, unspecified: I21.9

## 2008-03-18 ENCOUNTER — Ambulatory Visit: Payer: Self-pay | Admitting: Internal Medicine

## 2008-03-18 ENCOUNTER — Inpatient Hospital Stay: Payer: Self-pay | Admitting: Internal Medicine

## 2008-03-18 ENCOUNTER — Other Ambulatory Visit: Payer: Self-pay

## 2008-03-18 IMAGING — CR DG CHEST 1V PORT
1 series · 1 of 1 positions shown · non-contrast
Comparison: none

REASON FOR EXAM: Chest Pain
COMMENTS:

[view not recorded]
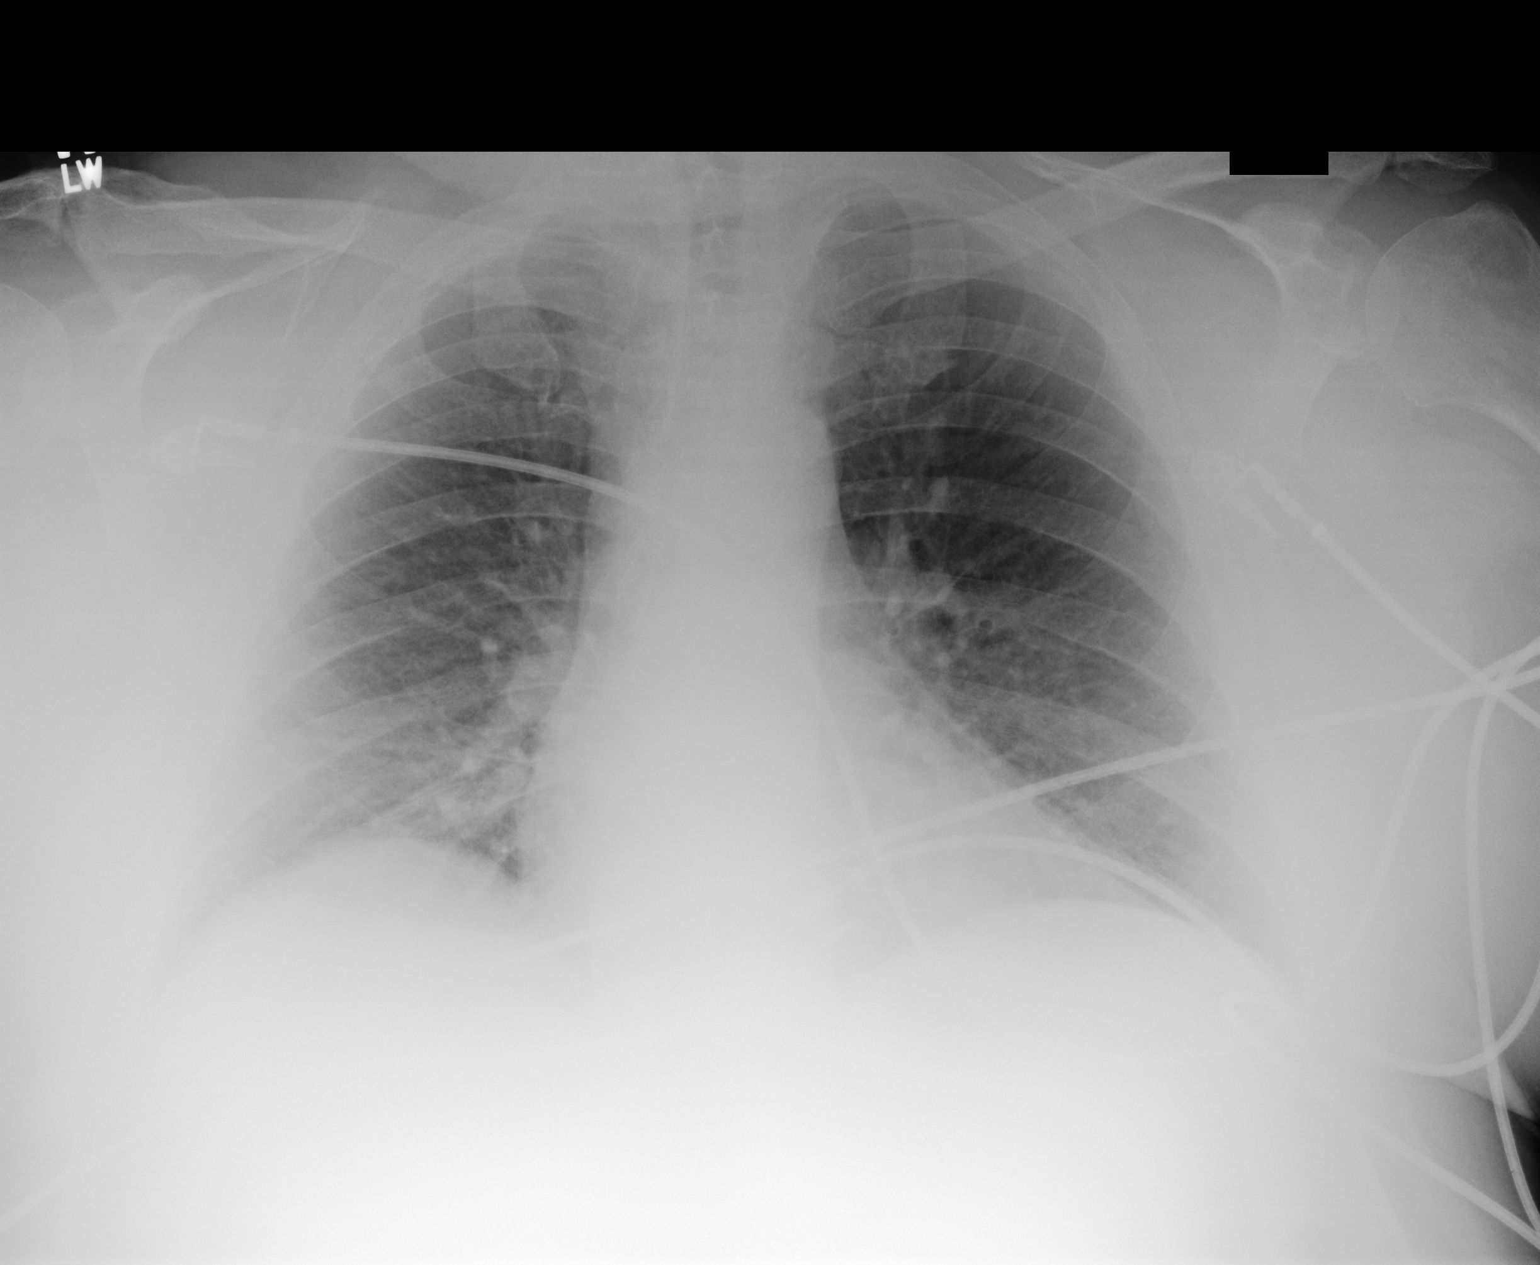

[1 of 1 positions shown; findings below may reference images not displayed]

PROCEDURE:     DXR - DXR PORTABLE CHEST SINGLE VIEW  - [DATE]  [DATE]

RESULT:     Comparison is made to the exam of [DATE]. The film is
underexposed. This is likely secondary to patient's body habitus. Cardiac
monitoring electrodes are present.

The lungs are clear. The heart and pulmonary vessels are normal. The bony
and mediastinal structures are unremarkable. There is no effusion. There is
no pneumothorax or evidence of congestive failure.
IMPRESSION: No acute cardiopulmonary disease. Stable appearance.

## 2008-03-18 IMAGING — US ABDOMEN ULTRASOUND
1 series · 17 of 25 positions shown · non-contrast
Comparison: none

REASON FOR EXAM: RUQ pain with radiation to groin
COMMENTS:

[Series 1: abdomen ultrasound · 17 of 62 slices shown]
[im 1/62]
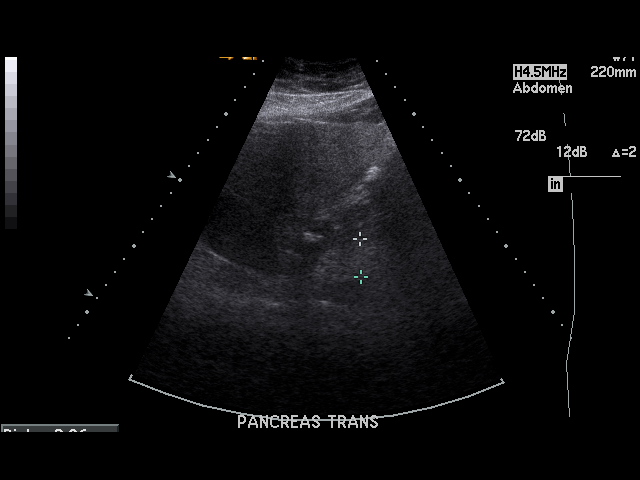
[im 6/62]
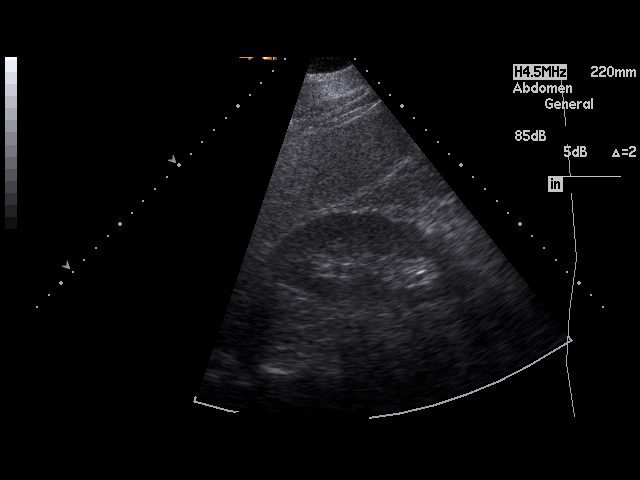
[im 8/62]
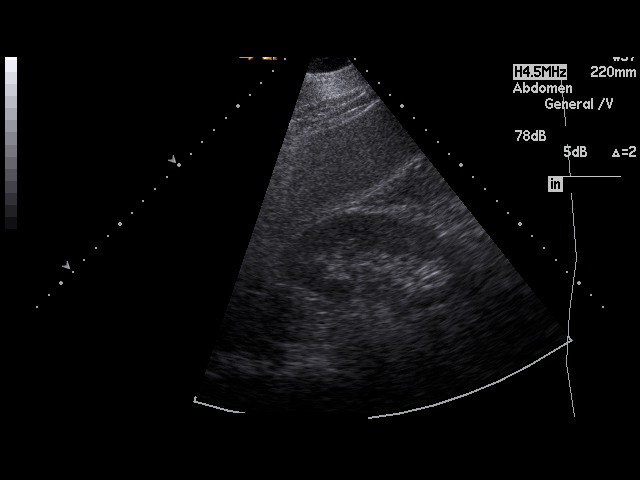
[im 13/62]
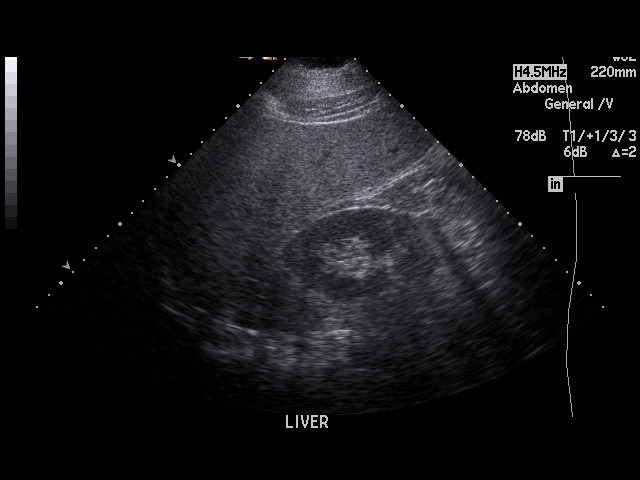
[im 16/62]
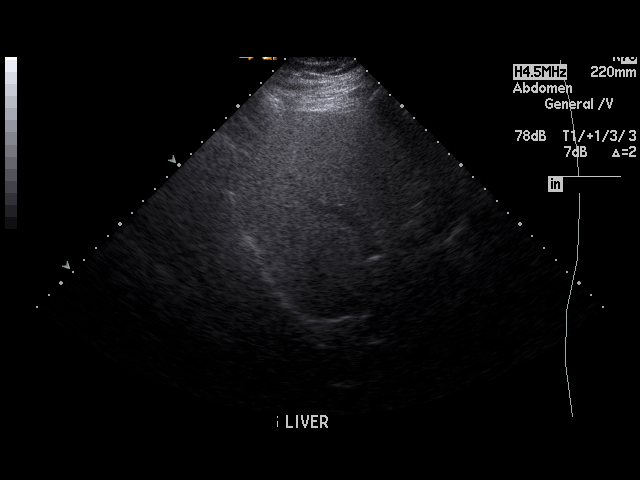
[im 21/62]
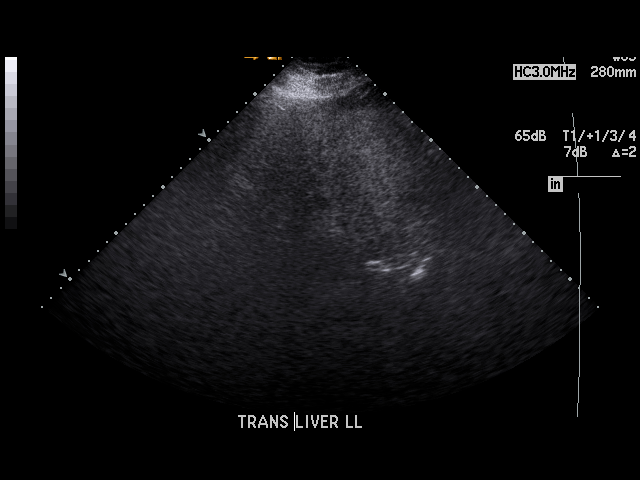
[im 23/62]
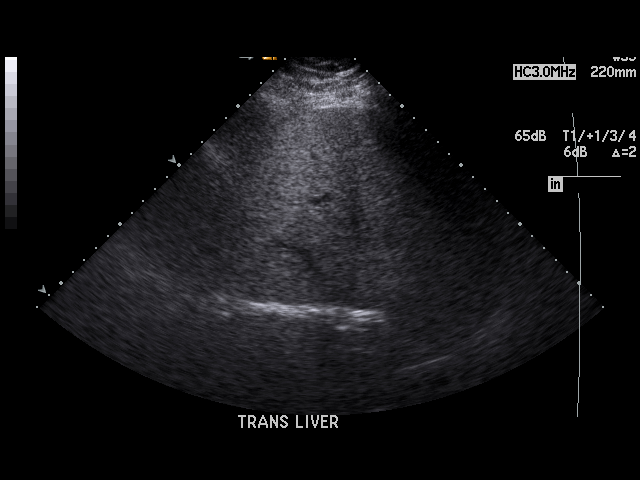
[im 28/62]
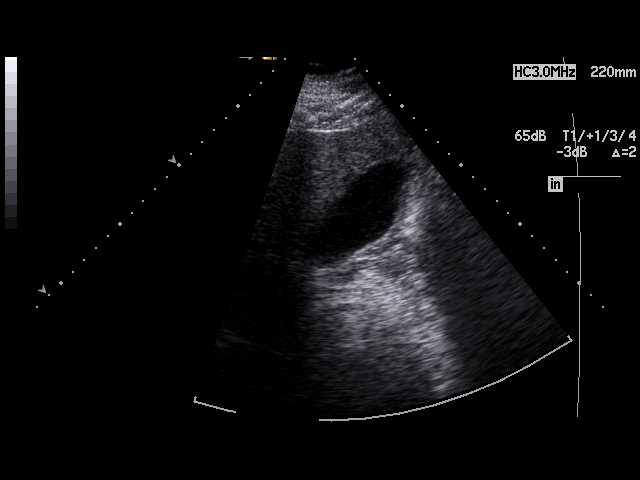
[im 31/62]
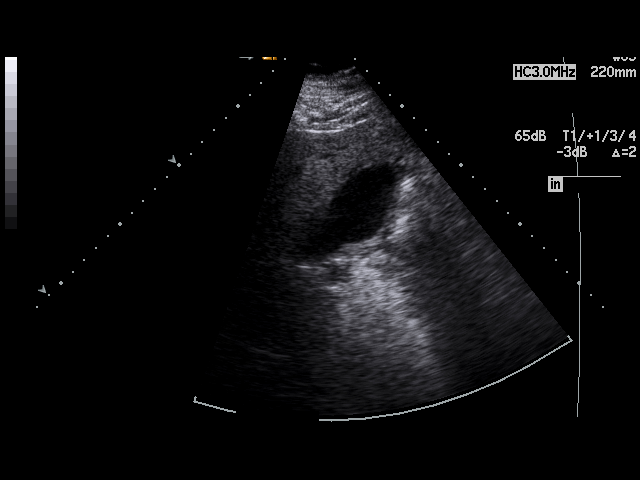
[im 34/62]
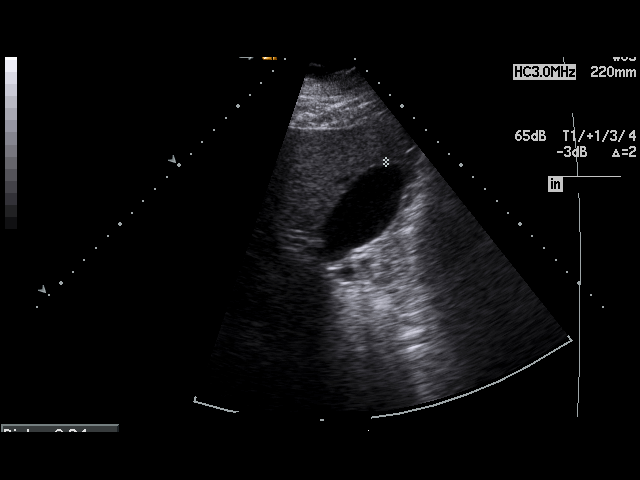
[im 39/62]
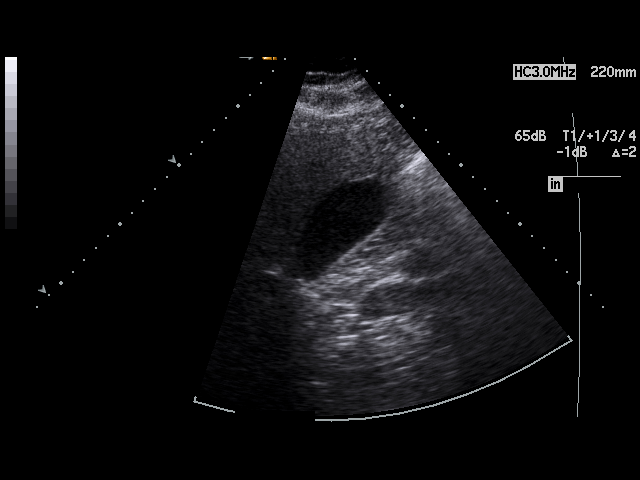
[im 41/62]
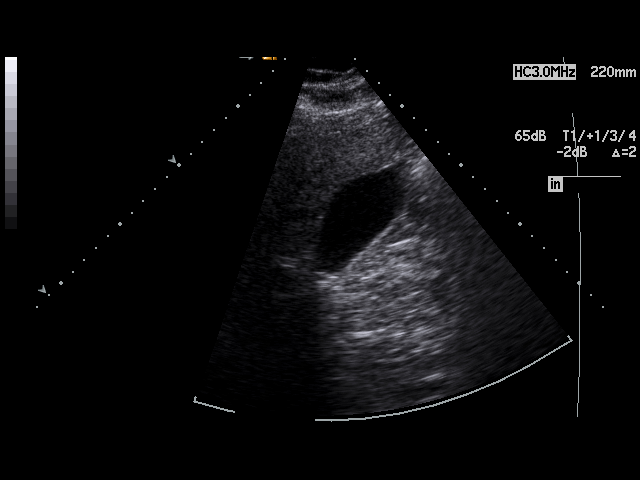
[im 46/62]
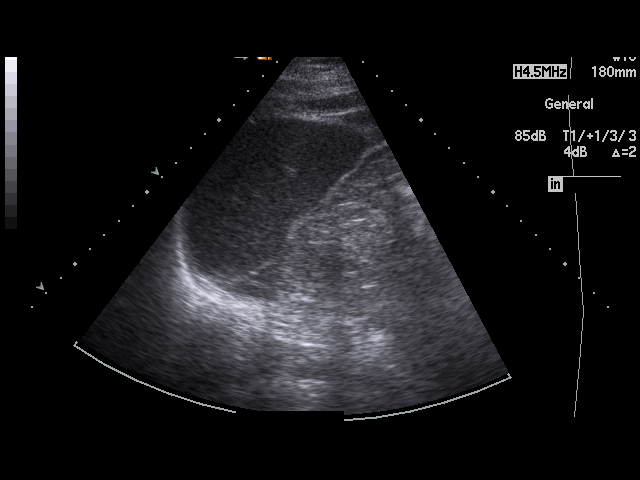
[im 49/62]
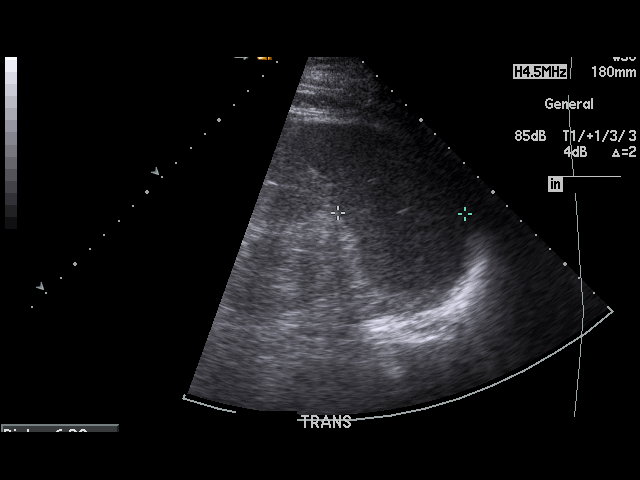
[im 54/62]
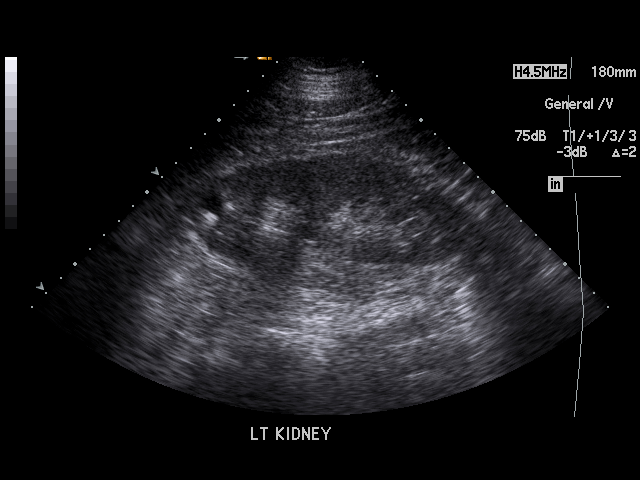
[im 56/62]
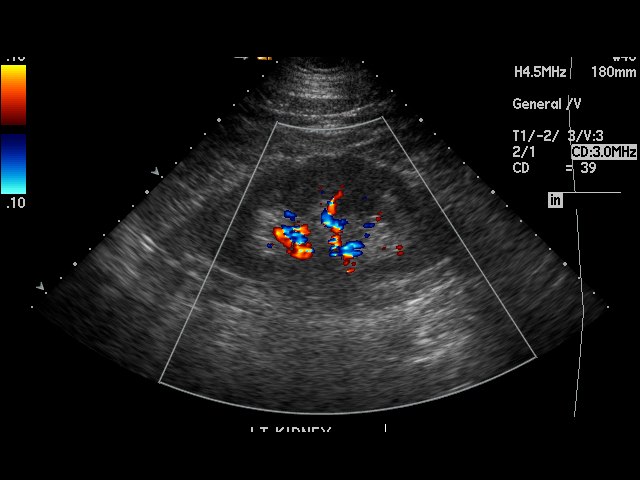
[im 62/62]
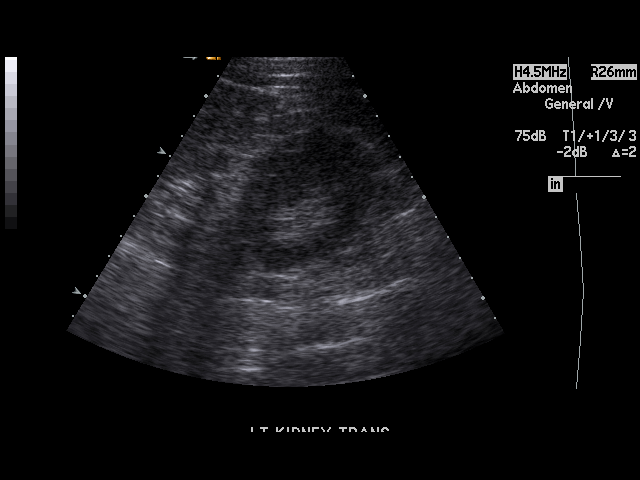

[17 of 25 positions shown; findings below may reference images not displayed]

PROCEDURE:     US  - US ABDOMEN GENERAL SURVEY  - [DATE]  [DATE]

RESULT:     Ultrasound of the abdomen demonstrates a homogeneous
hyperechogenicity of the liver suggestive of fatty infiltration. The
pancreas is incompletely visualized with no gross abnormality evident. The
portal venous flow is normal. The gallbladder is normal. The common bile
duct diameter is 4.3 mm. The kidneys and spleen are normal. There is
incidentally noted a cyst in the upper pole the LEFT kidney measuring
approximately 13 mm in greatest diameter.
IMPRESSION: 1. Poor visualization of the pancreas.
2. Small cyst upper pole LEFT kidney.
3. Diffuse fatty infiltration of the liver is presumed present given the
homogeneous diffuse increased echogenicity.

## 2008-03-20 ENCOUNTER — Ambulatory Visit: Payer: Self-pay | Admitting: Thoracic Surgery (Cardiothoracic Vascular Surgery)

## 2008-03-20 ENCOUNTER — Ambulatory Visit: Payer: Self-pay | Admitting: Cardiology

## 2008-03-20 ENCOUNTER — Inpatient Hospital Stay (HOSPITAL_COMMUNITY): Admission: AD | Admit: 2008-03-20 | Discharge: 2008-03-27 | Payer: Self-pay | Admitting: Cardiology

## 2008-03-20 ENCOUNTER — Encounter: Payer: Self-pay | Admitting: Thoracic Surgery (Cardiothoracic Vascular Surgery)

## 2008-03-23 IMAGING — CR DG CHEST 1V PORT
1 series · 1 of 1 positions shown · non-contrast
Comparison: [DATE]

CLINICAL DATA: CAD.  CABG.

PORTABLE CHEST - 1 VIEW

[view not recorded]
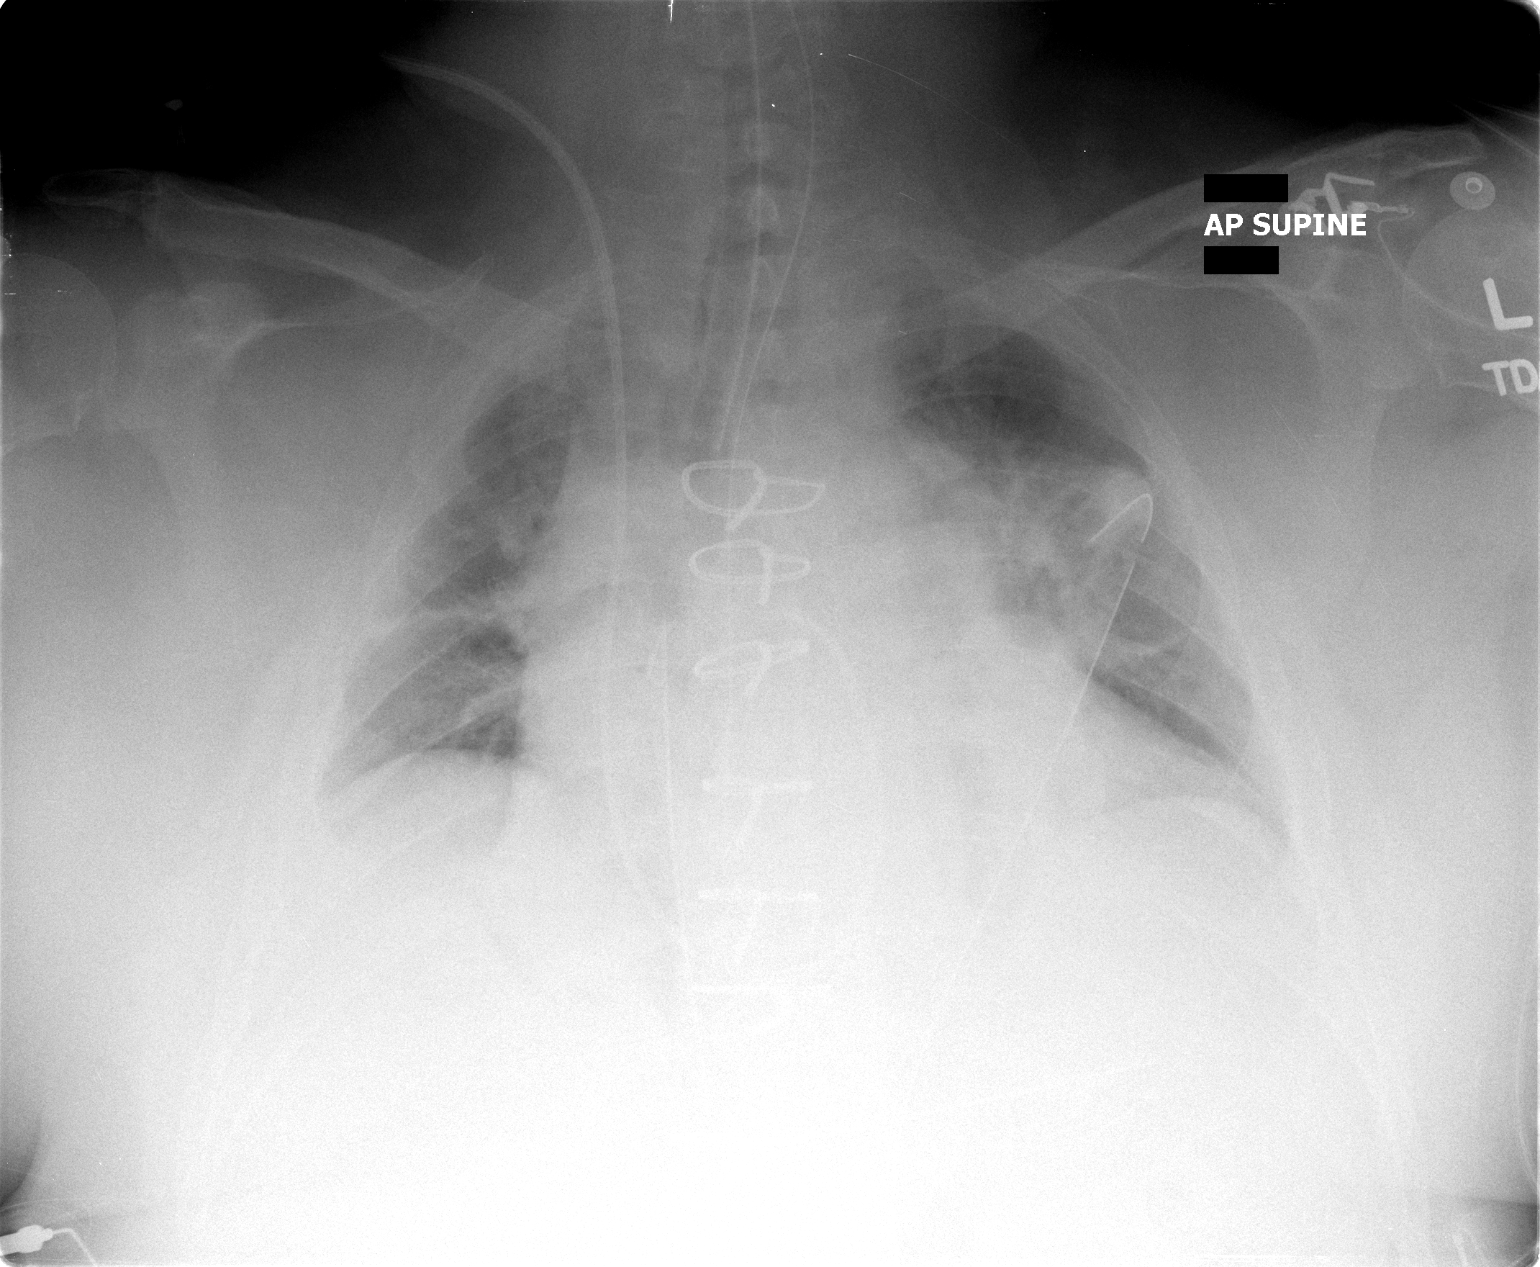

[1 of 1 positions shown; findings below may reference images not displayed]

FINDINGS: Enlarged cardiomediastinal silhouette. Mild subsegmental
atelectasis bilaterally The patient is status post CABG with
multiple sternal wire sutures noted.  Mediastinal and left pleural
chest tubes in position.  Satisfactory ET tube position.  SGC
enters via right jugular vein approach.  The tip is in the region
of the origin of the right main pulmonary artery.  NG tube is noted
traversing the esophagus
IMPRESSION: Status post CABG.  Mild atelectasis.  No pneumothorax.  Support
tubes and catheters appear in satisfactory position.

## 2008-03-24 IMAGING — CR DG CHEST 1V PORT
1 series · 1 of 1 positions shown · non-contrast
Comparison: [DATE]

CLINICAL DATA: Post CABG

PORTABLE CHEST - 1 VIEW

[AP]
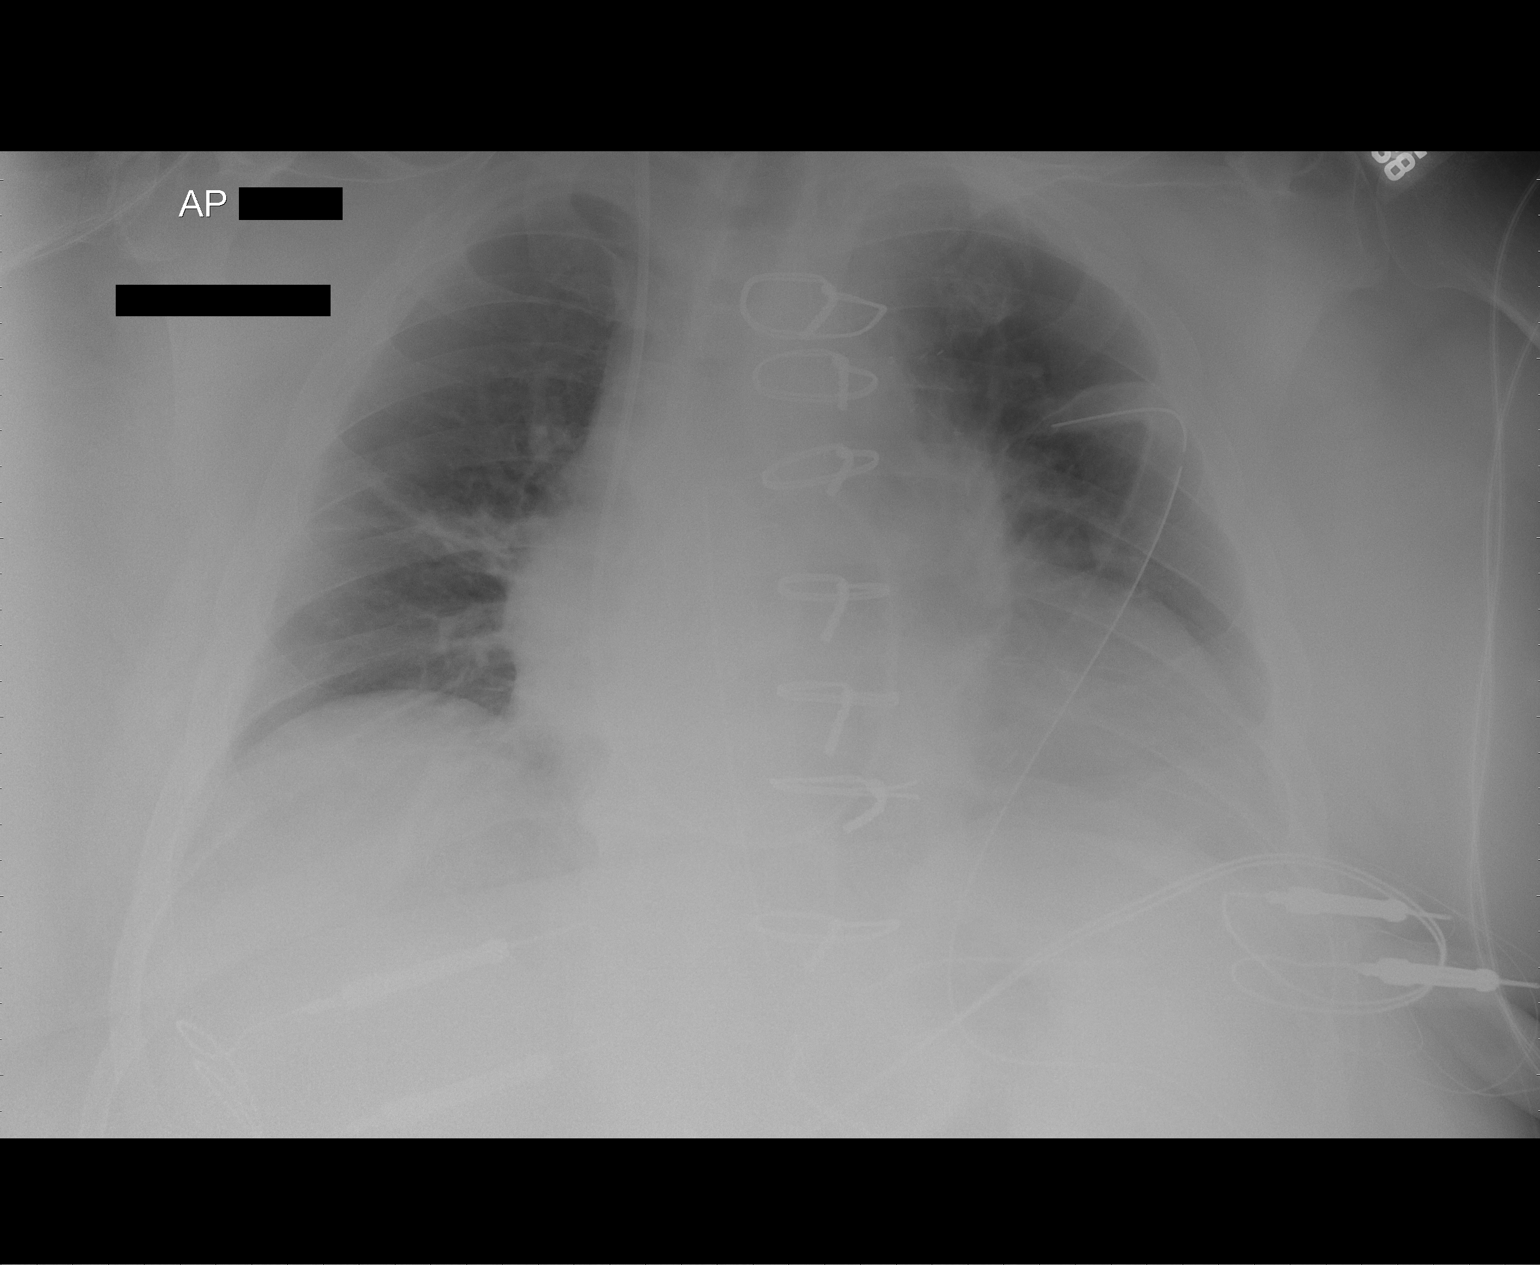

[1 of 1 positions shown; findings below may reference images not displayed]

FINDINGS: ET tube removed.  Lung aeration slightly improved.  Heart
enlarged without congestive heart failure.  No pneumothorax.
IMPRESSION: 1.  ET tube removed.
2.  Satisfactory postoperative chest x-ray with improved aeration
of the lungs since yesterday's exam.

## 2008-03-25 IMAGING — CR DG CHEST 2V
2 series · 2 of 2 positions shown · non-contrast
Comparison: [DATE]

CLINICAL DATA: Cough.  Status post CABG.

CHEST - 2 VIEW

[w chest pa]
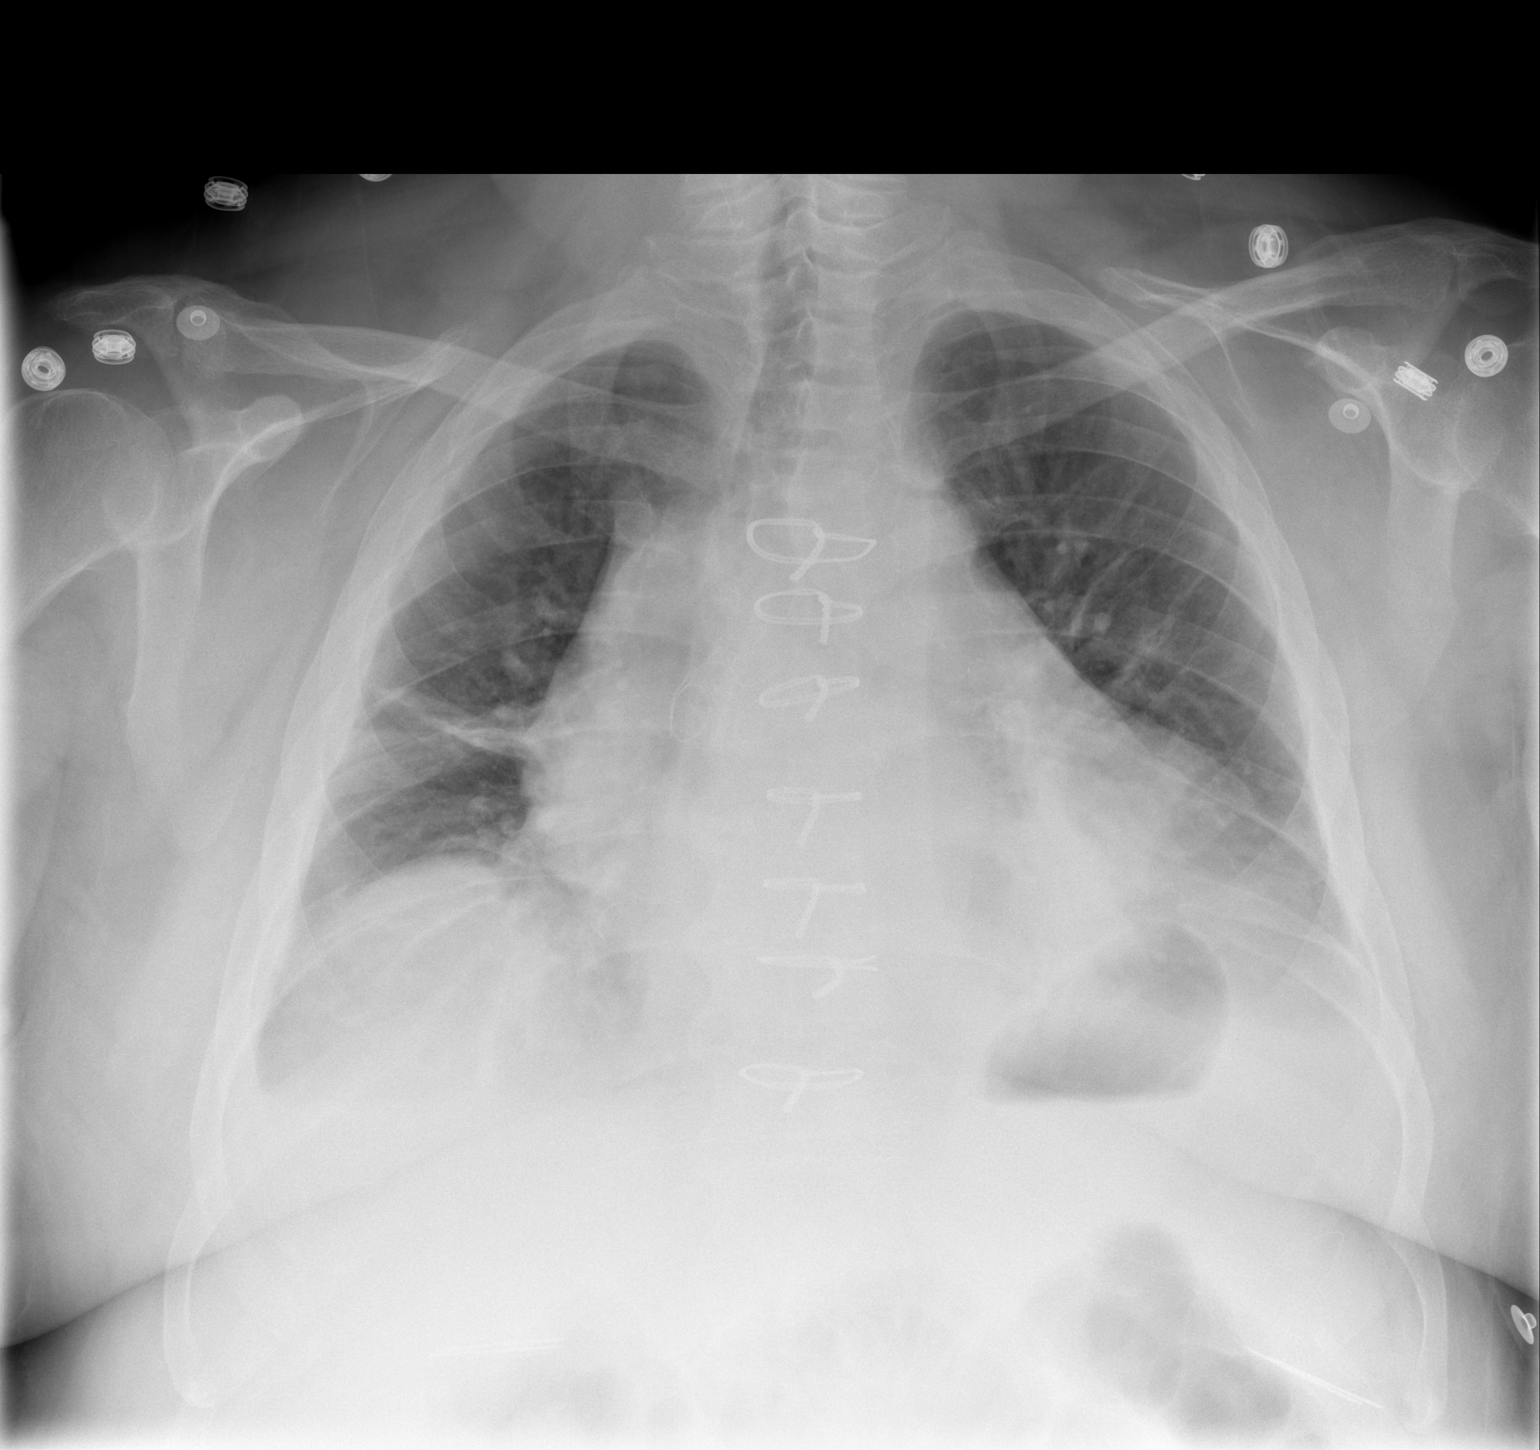

[w chest lat]
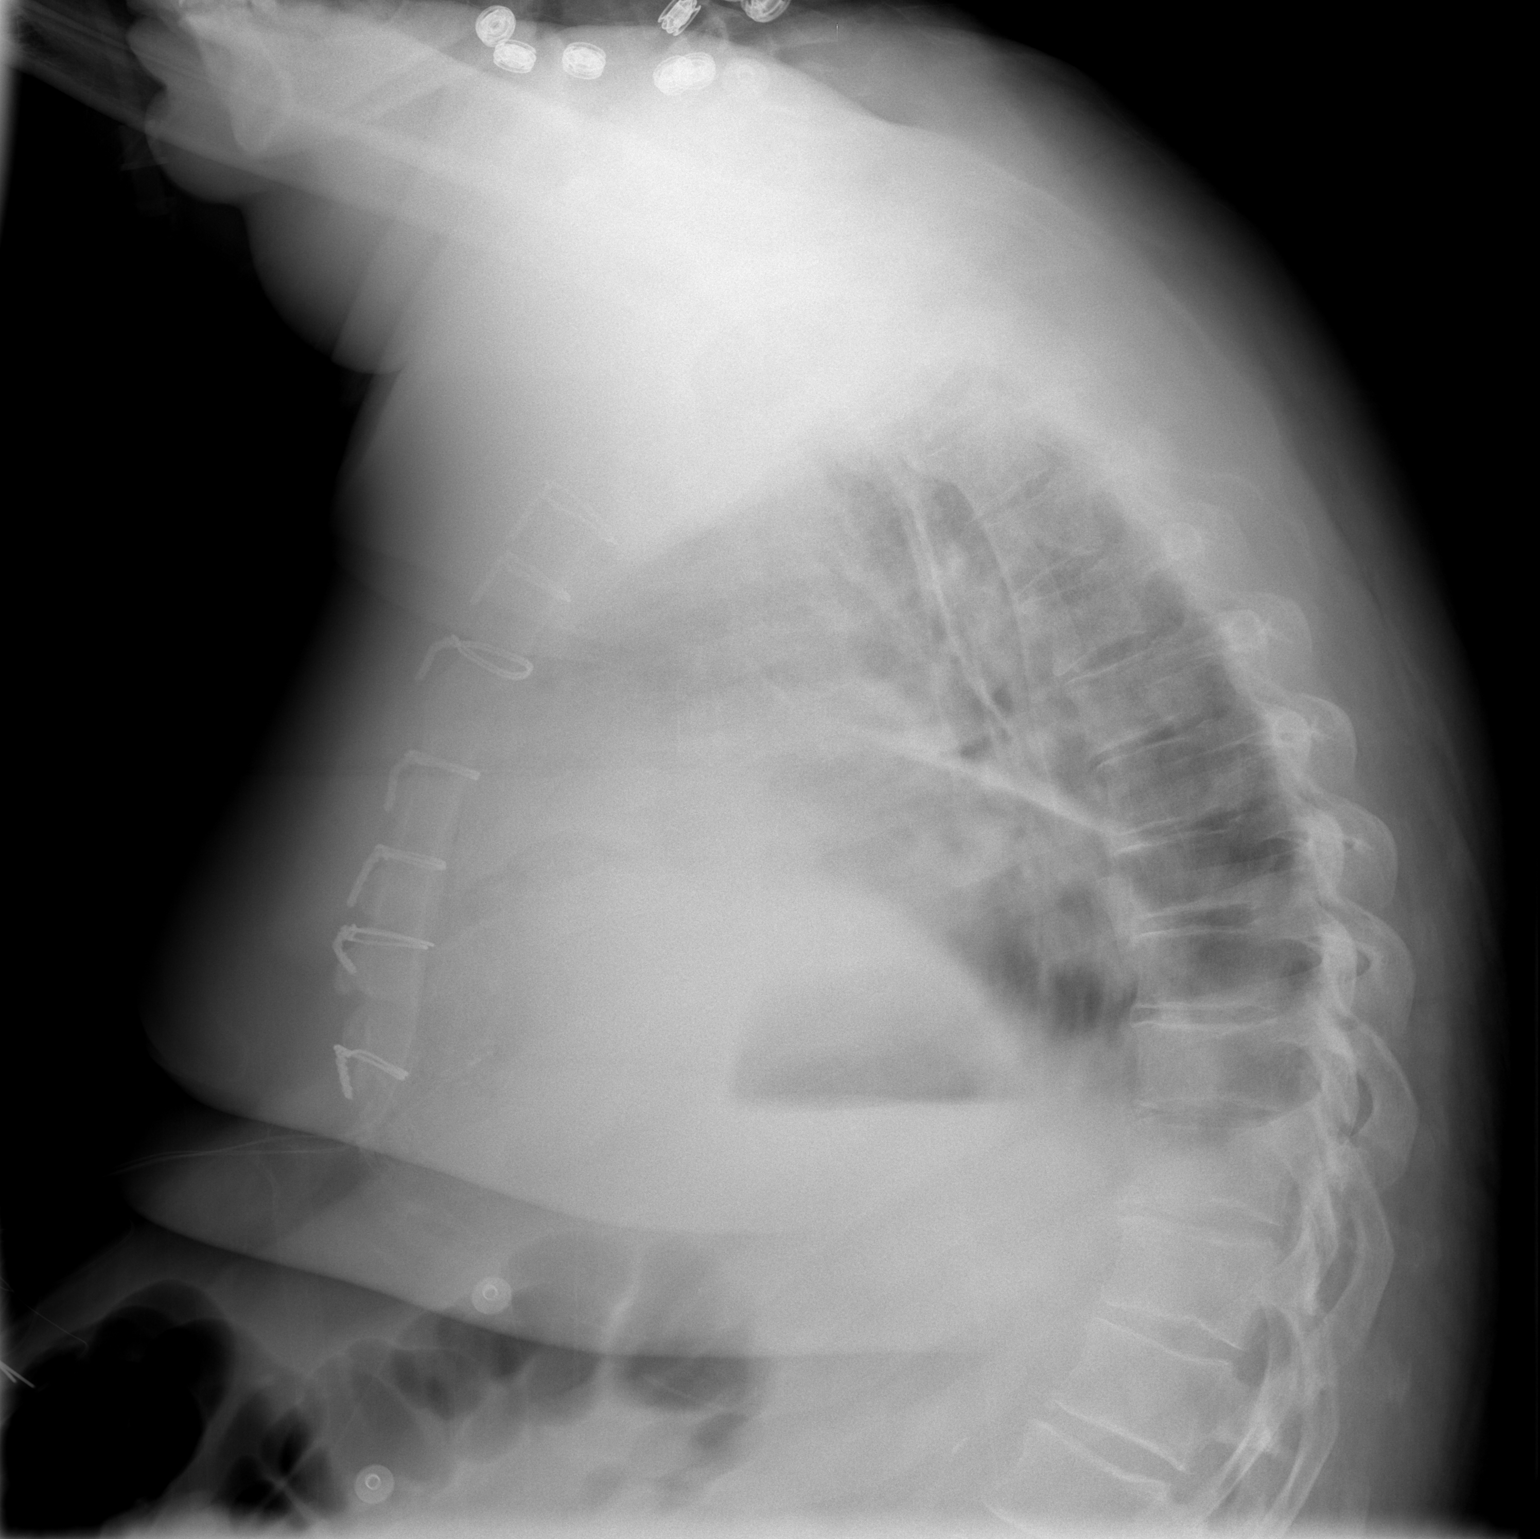

[2 of 2 positions shown; findings below may reference images not displayed]

FINDINGS: The left-sided chest tube and Swan-Ganz catheter have
been removed.  There is no pneumothorax.  There is some focal
atelectasis at the left base medially and in the right mid zone.
Aeration has improved bilaterally.  There is mild cardiomegaly.

The pulmonary vascularity is normal.
IMPRESSION: No pneumothorax after chest tube removal.  Mild bilateral
atelectasis.

## 2008-04-20 ENCOUNTER — Encounter: Admission: RE | Admit: 2008-04-20 | Discharge: 2008-04-20 | Payer: Self-pay | Admitting: Surgery

## 2008-04-20 ENCOUNTER — Ambulatory Visit: Payer: Self-pay | Admitting: Surgery

## 2008-04-20 IMAGING — CR DG CHEST 2V
2 series · 2 of 2 positions shown · non-contrast
Comparison: [DATE]

CLINICAL DATA: status post CABG

CHEST - 2 VIEW

[w chest pa]
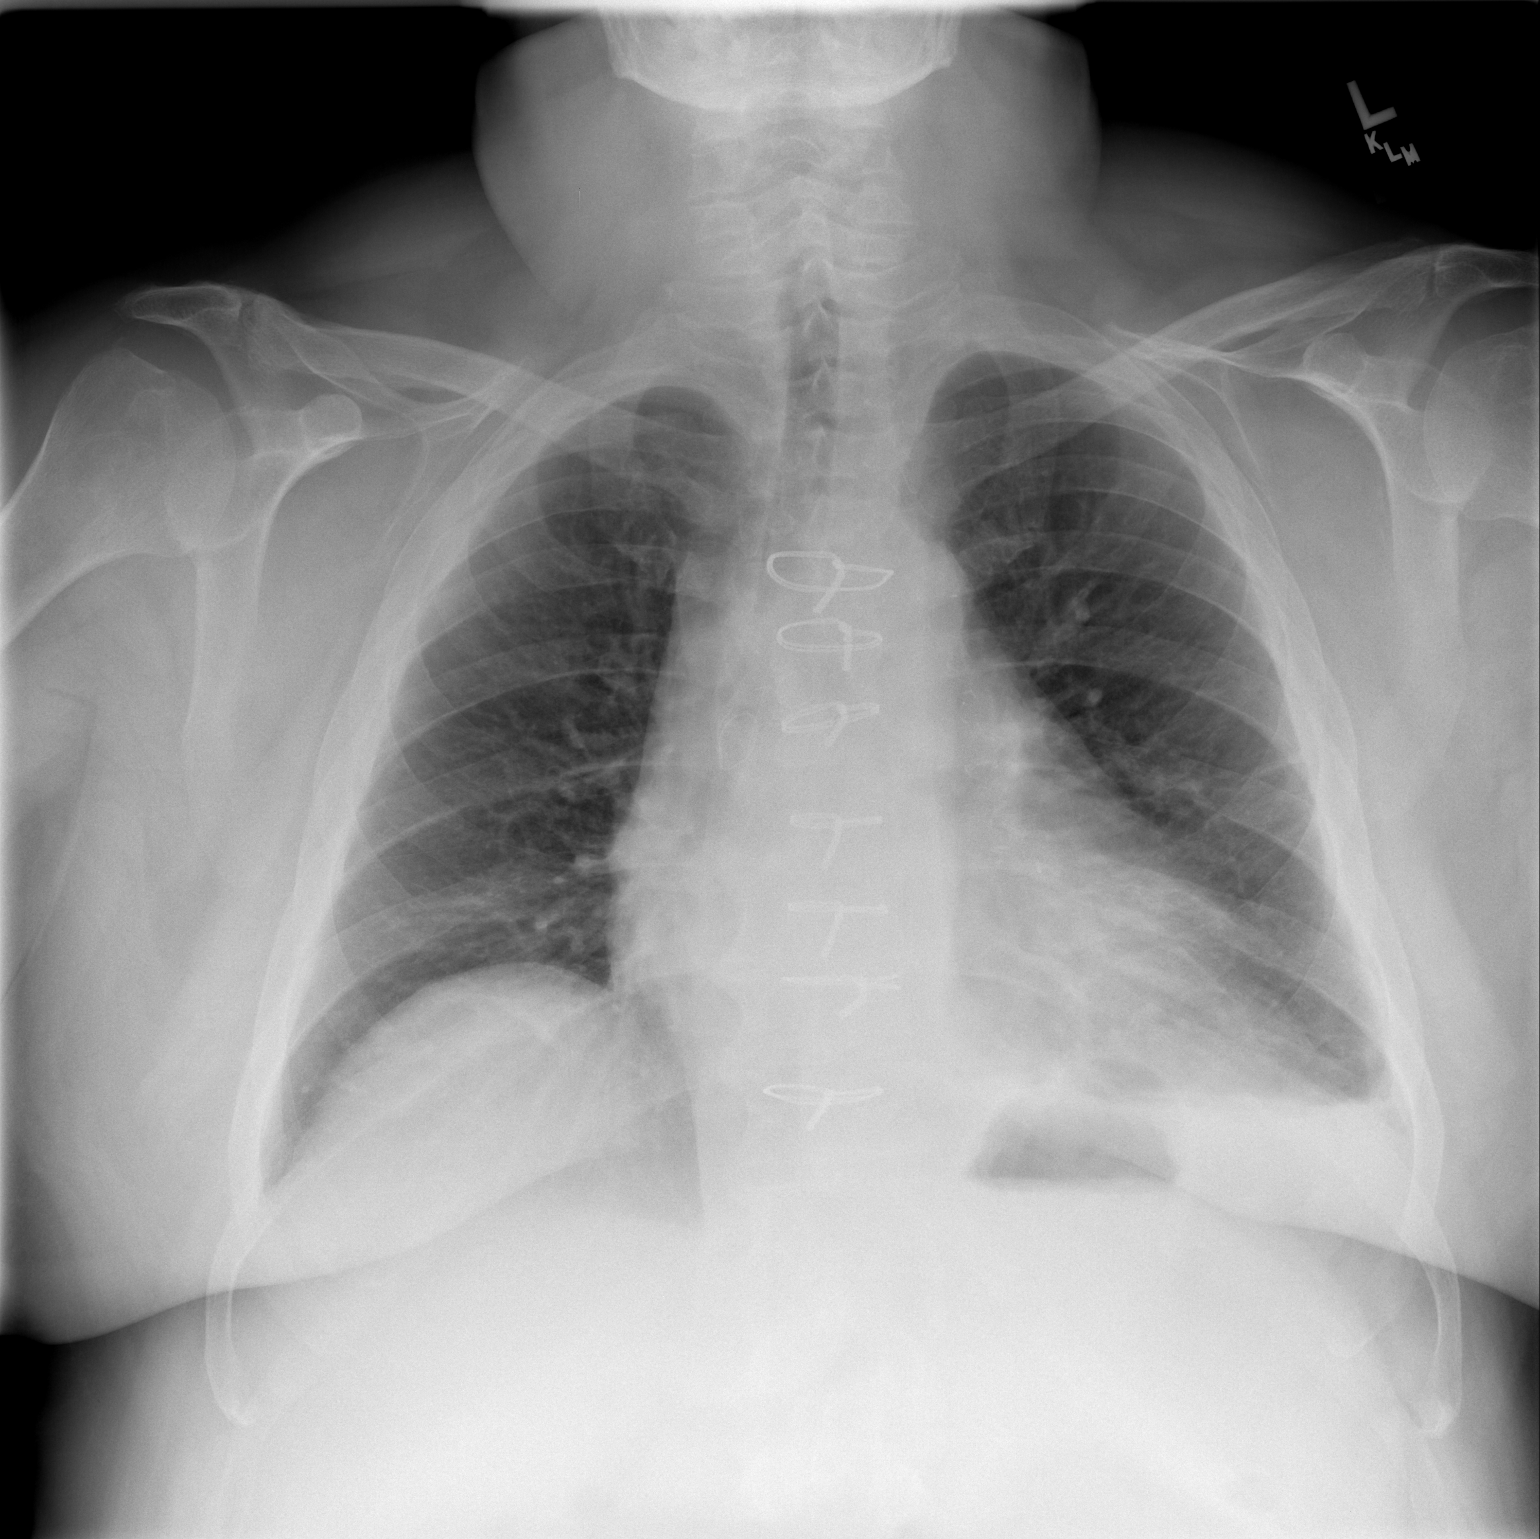

[w chest lat *]
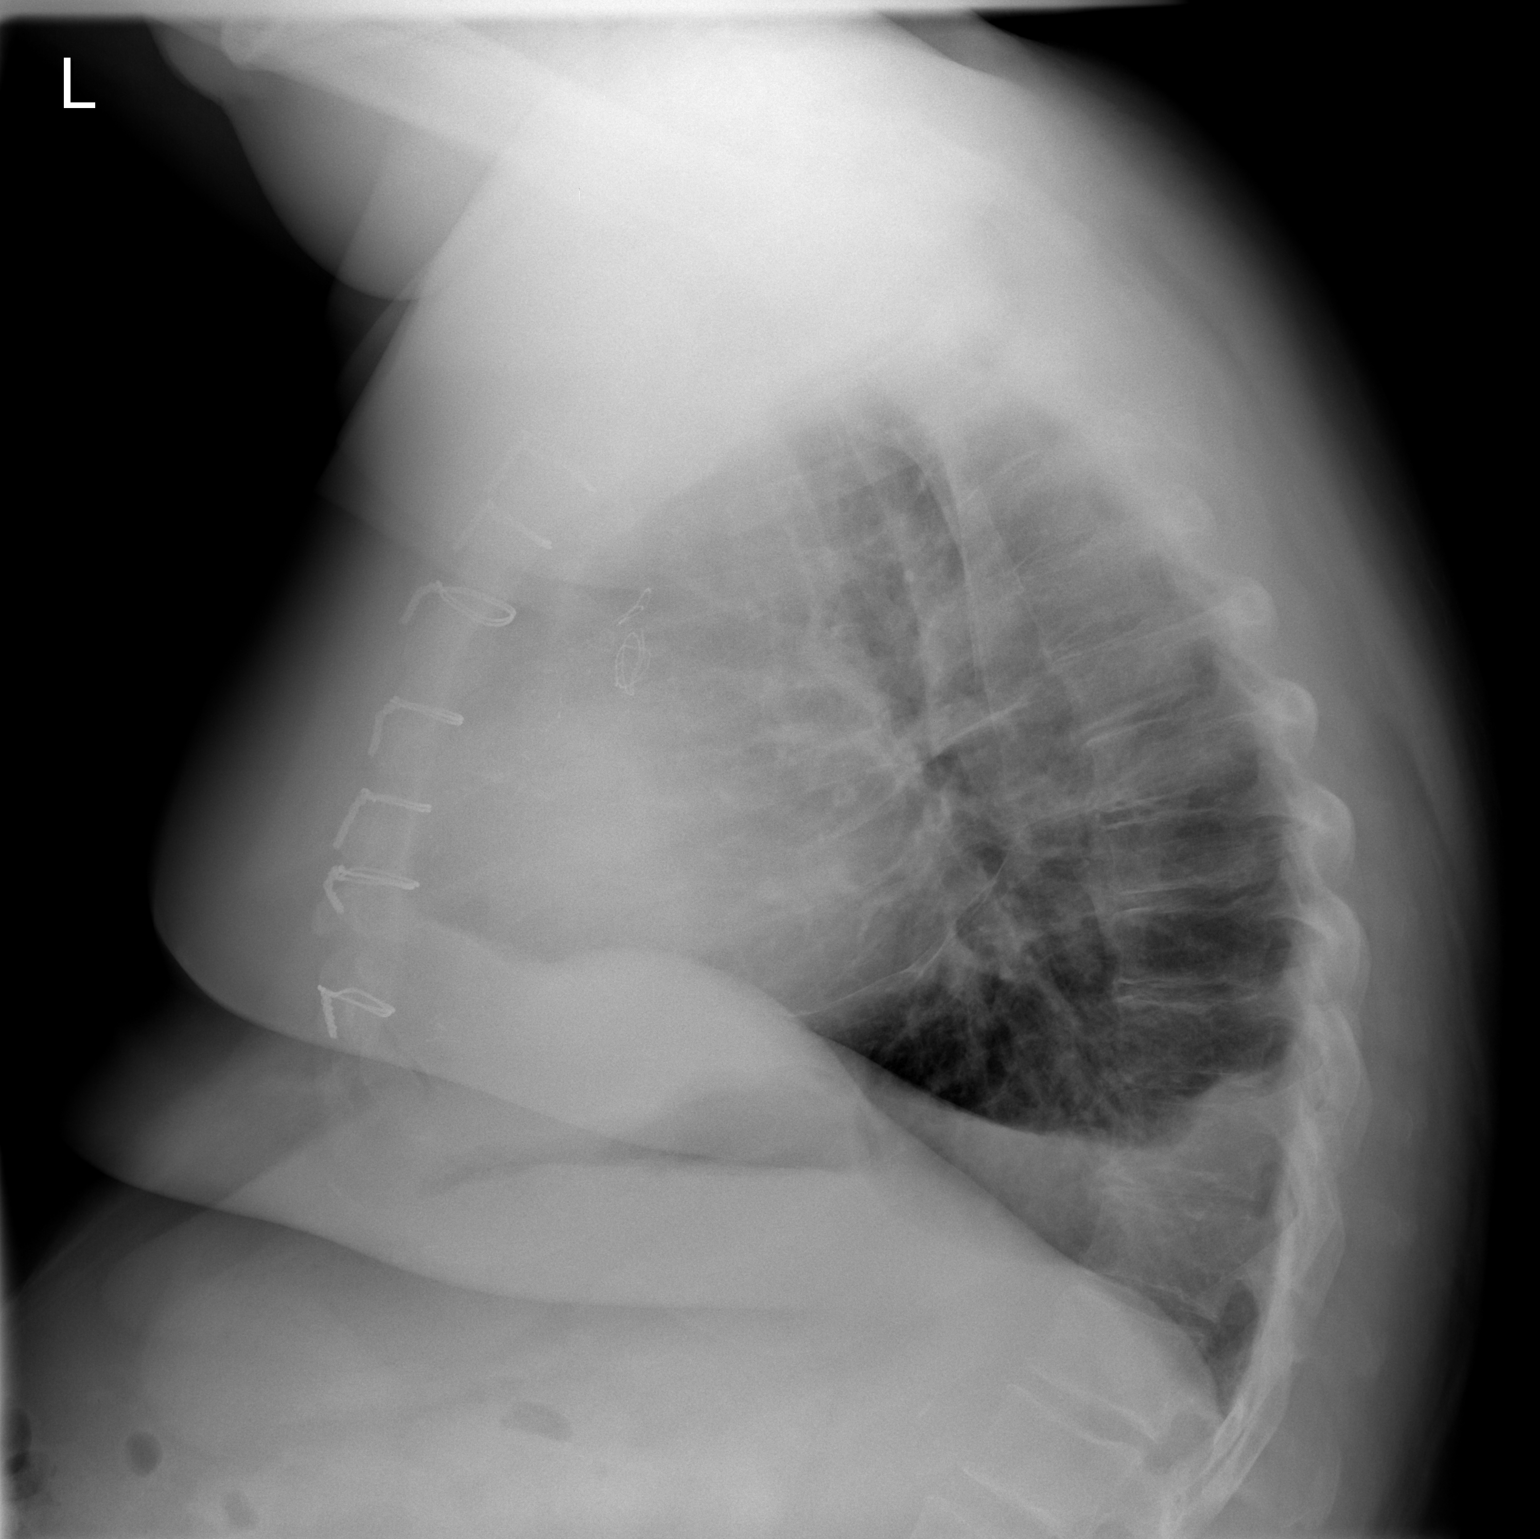

[2 of 2 positions shown; findings below may reference images not displayed]

FINDINGS: Cardiomegaly.  Small left pleural effusion.  Moderate
pulmonary vascular congestion.  No pulmonary edema.
IMPRESSION: Cardiomegaly and pulmonary vascular congestion.  Minimal left
pleural effusion.

## 2008-04-21 ENCOUNTER — Ambulatory Visit: Payer: Self-pay | Admitting: Cardiology

## 2008-05-28 ENCOUNTER — Ambulatory Visit: Payer: Self-pay | Admitting: Cardiology

## 2008-05-29 ENCOUNTER — Encounter: Payer: Self-pay | Admitting: Cardiology

## 2008-05-29 LAB — CONVERTED CEMR LAB
ALT: 15 units/L (ref 0–53)
AST: 14 units/L (ref 0–37)
Albumin: 4.2 g/dL (ref 3.5–5.2)
Alkaline Phosphatase: 109 units/L (ref 39–117)
BUN: 12 mg/dL (ref 6–23)
CO2: 25 meq/L (ref 19–32)
Calcium: 9.1 mg/dL (ref 8.4–10.5)
Chloride: 104 meq/L (ref 96–112)
Cholesterol: 190 mg/dL (ref 0–200)
Creatinine, Ser: 0.91 mg/dL (ref 0.40–1.50)
Glucose, Bld: 101 mg/dL — ABNORMAL HIGH (ref 70–99)
HDL: 49 mg/dL (ref >39)
LDL Cholesterol: 112 mg/dL — ABNORMAL HIGH (ref 0–99)
Potassium: 4.4 meq/L (ref 3.5–5.3)
Sodium: 141 meq/L (ref 135–145)
Total Bilirubin: 0.3 mg/dL (ref 0.3–1.2)
Total CHOL/HDL Ratio: 3.9
Total Protein: 7.2 g/dL (ref 6.0–8.3)
Triglycerides: 143 mg/dL (ref <150)
VLDL: 29 mg/dL (ref 0–40)

## 2008-06-02 ENCOUNTER — Ambulatory Visit: Payer: Self-pay | Admitting: Cardiology

## 2008-07-02 IMAGING — CR DG CHEST 2V
2 series · 2 of 2 positions shown · non-contrast
Comparison: None

CLINICAL DATA: Coronary artery disease, preoperative for CABG

CHEST - 2 VIEW

[w chest pa]
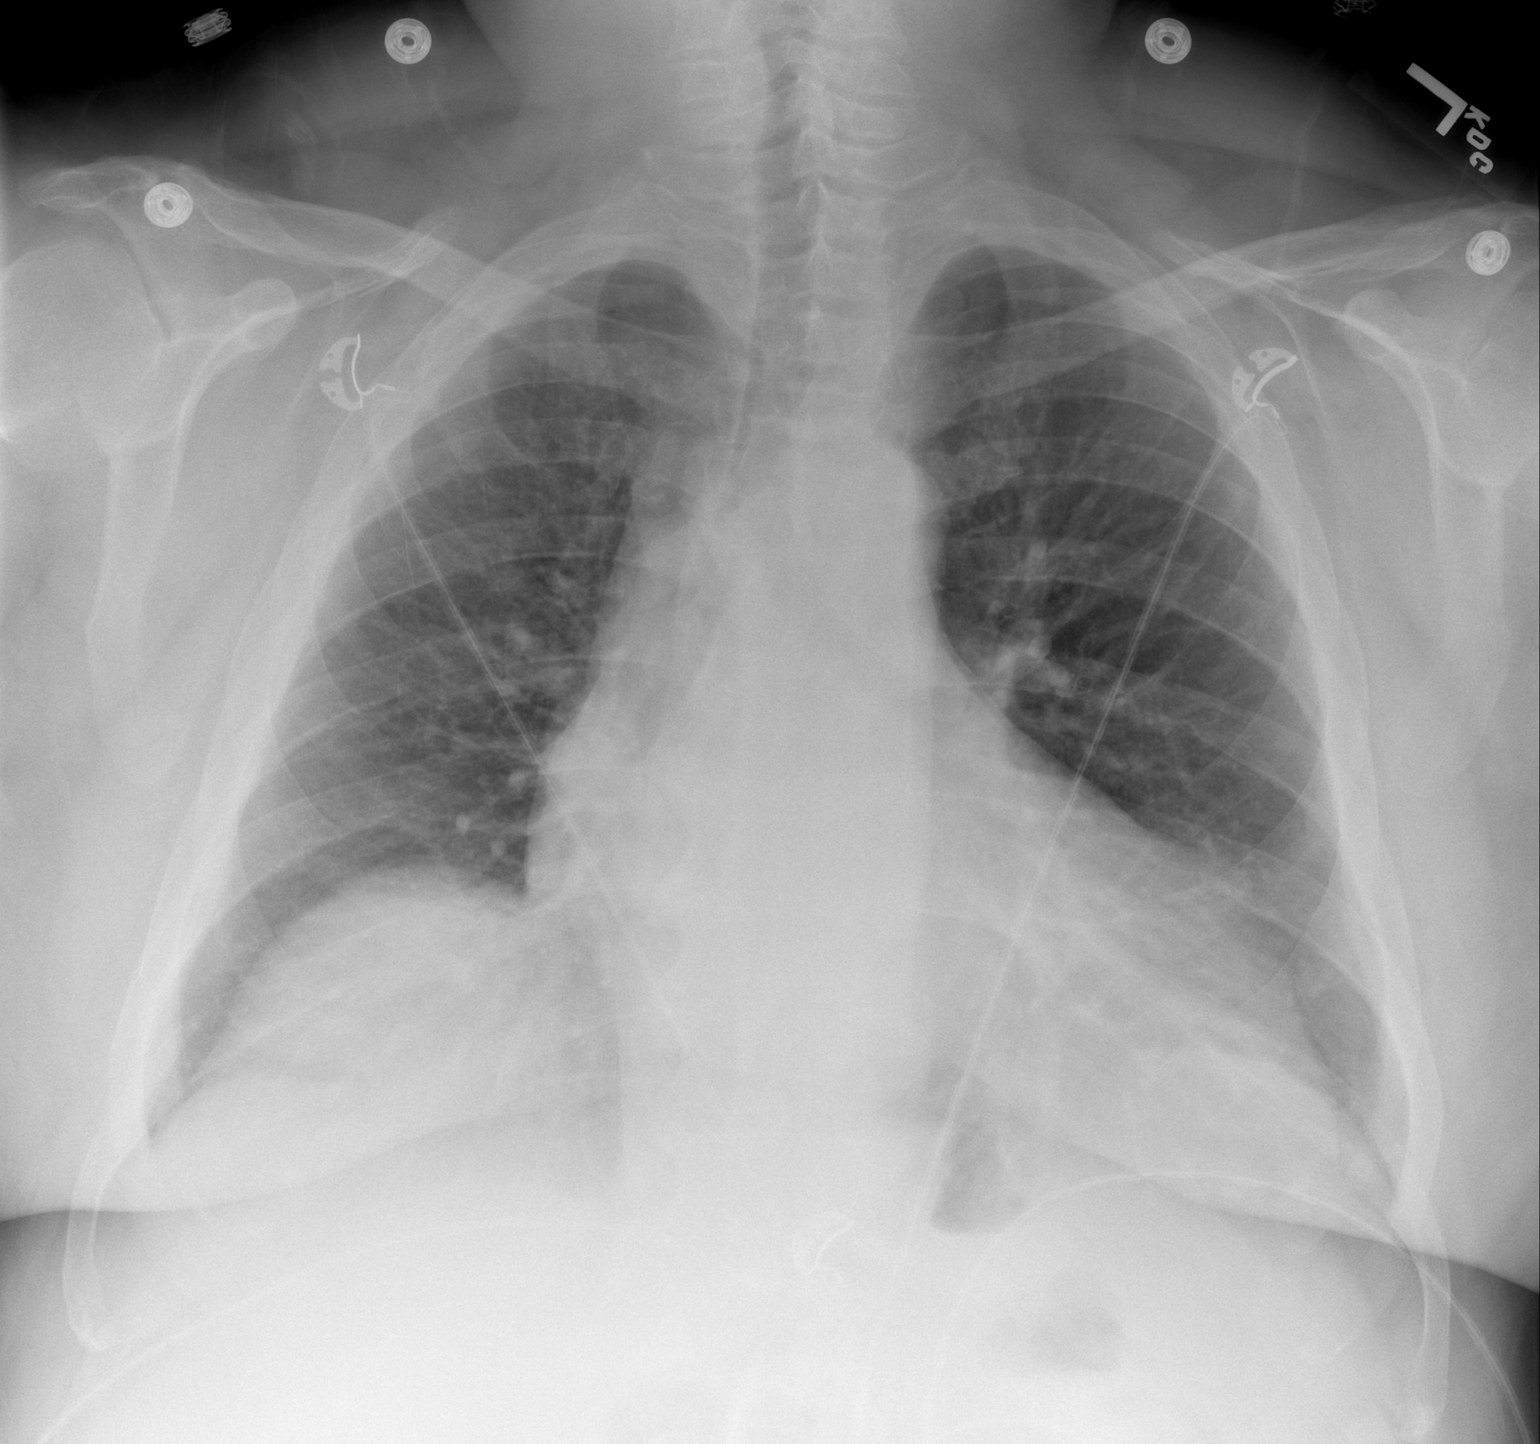

[w chest lat *]
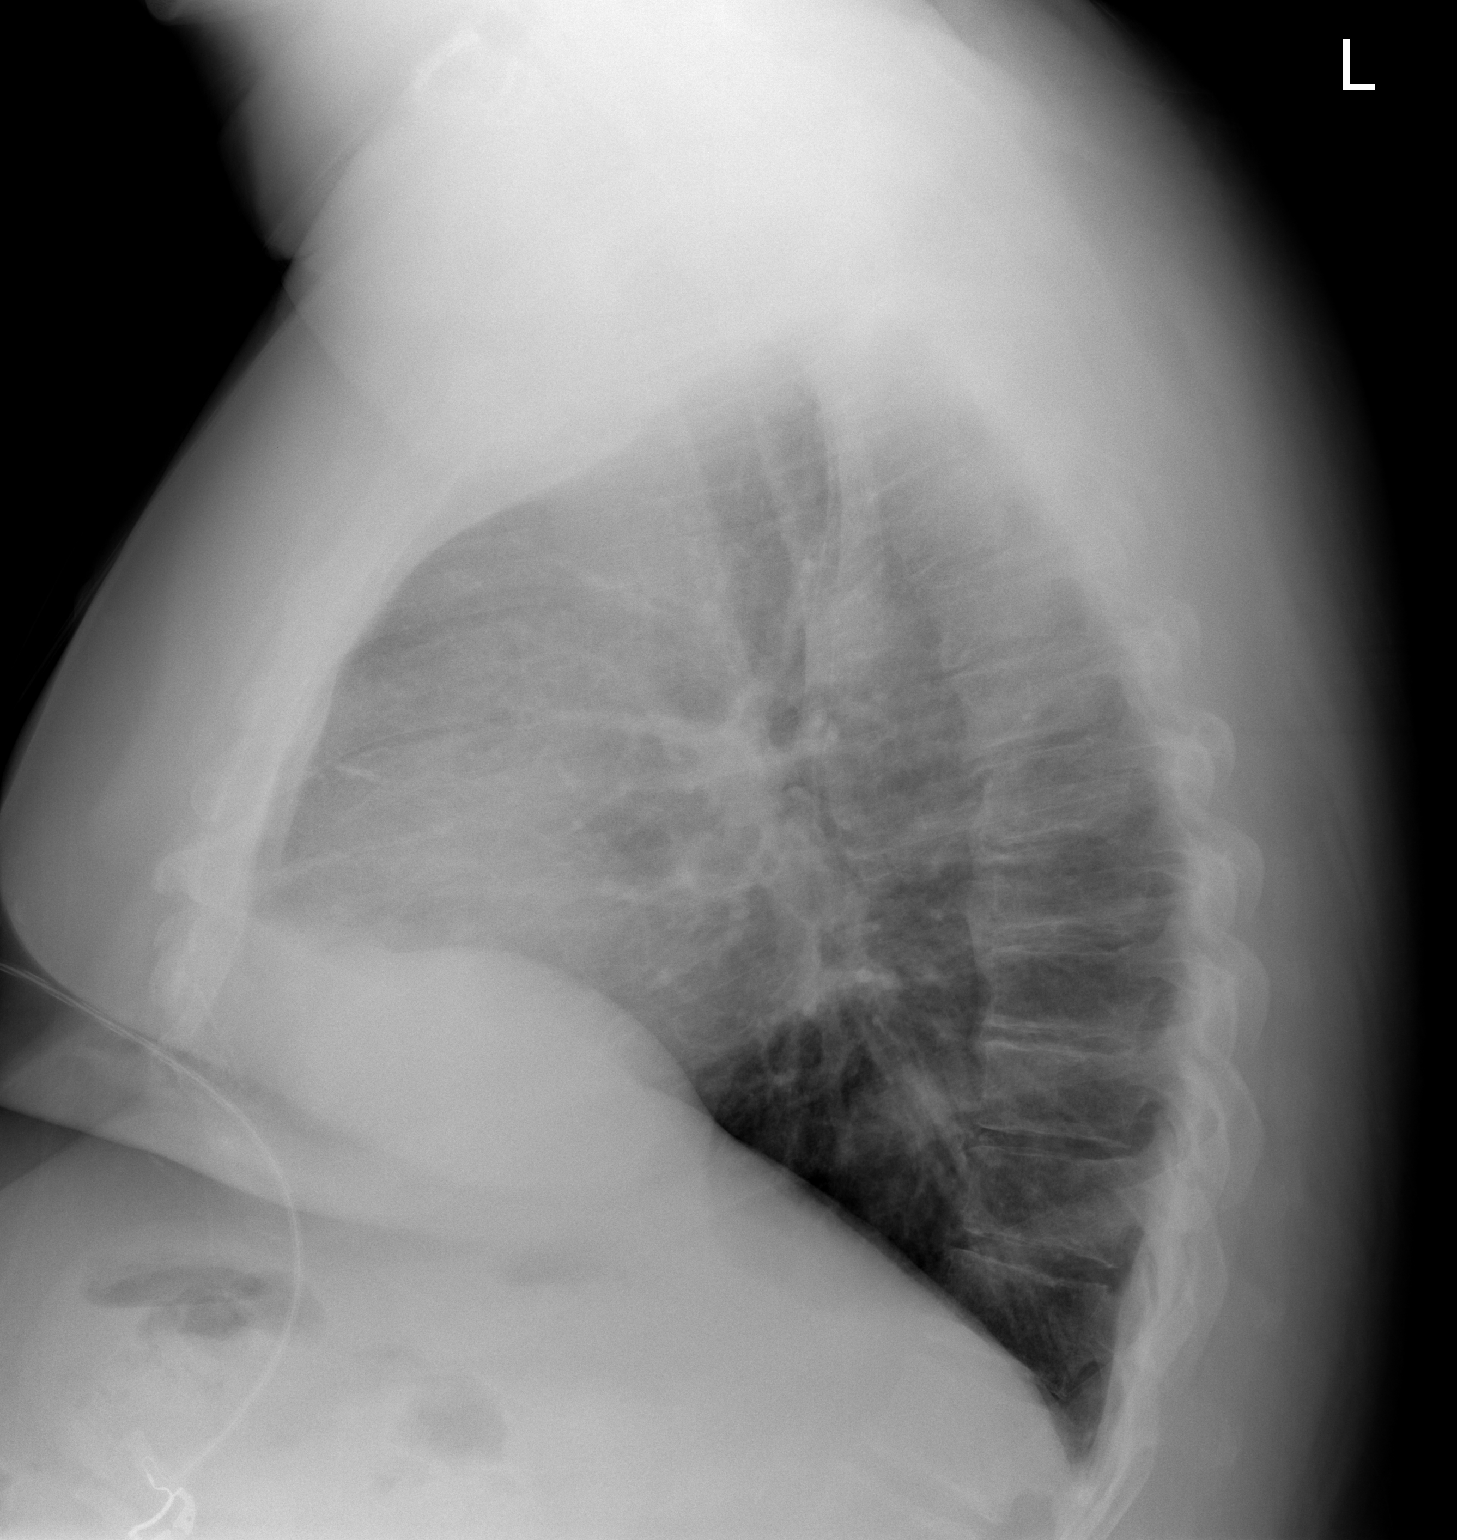

[2 of 2 positions shown; findings below may reference images not displayed]

FINDINGS: Mild cardiomegaly, no edema.  Low volumes.  No pleural
effusion.  No acute osseous finding.
IMPRESSION: No acute cardiopulmonary process.

## 2008-09-01 ENCOUNTER — Ambulatory Visit: Payer: Self-pay | Admitting: Cardiology

## 2008-09-01 LAB — CONVERTED CEMR LAB
ALT: 14 units/L (ref 0–53)
AST: 15 units/L (ref 0–37)
Albumin: 4.2 g/dL (ref 3.5–5.2)
Alkaline Phosphatase: 102 units/L (ref 39–117)
BUN: 15 mg/dL (ref 6–23)
CO2: 20 meq/L (ref 19–32)
Calcium: 9.3 mg/dL (ref 8.4–10.5)
Chloride: 104 meq/L (ref 96–112)
Cholesterol: 250 mg/dL — ABNORMAL HIGH (ref 0–200)
Creatinine, Ser: 0.75 mg/dL (ref 0.40–1.50)
Glucose, Bld: 106 mg/dL — ABNORMAL HIGH (ref 70–99)
HDL: 49 mg/dL (ref >39)
LDL Cholesterol: 181 mg/dL — ABNORMAL HIGH (ref 0–99)
Potassium: 4.2 meq/L (ref 3.5–5.3)
Sodium: 139 meq/L (ref 135–145)
Total Bilirubin: 0.7 mg/dL (ref 0.3–1.2)
Total CHOL/HDL Ratio: 5.1
Total Protein: 7 g/dL (ref 6.0–8.3)
Triglycerides: 102 mg/dL (ref <150)
VLDL: 20 mg/dL (ref 0–40)

## 2008-11-03 ENCOUNTER — Ambulatory Visit: Payer: Self-pay | Admitting: Cardiovascular Disease

## 2008-11-03 ENCOUNTER — Encounter: Payer: Self-pay | Admitting: Cardiology

## 2008-11-03 LAB — CONVERTED CEMR LAB
ALT: 17 units/L (ref 0–53)
AST: 19 units/L (ref 0–37)
Albumin: 4.3 g/dL (ref 3.5–5.2)
Alkaline Phosphatase: 108 units/L (ref 39–117)
Bilirubin, Direct: 0.1 mg/dL (ref 0.0–0.3)
Cholesterol: 241 mg/dL — ABNORMAL HIGH (ref 0–200)
HDL: 55 mg/dL (ref >39)
Indirect Bilirubin: 0.5 mg/dL (ref 0.0–0.9)
LDL Cholesterol: 166 mg/dL — ABNORMAL HIGH (ref 0–99)
Total Bilirubin: 0.6 mg/dL (ref 0.3–1.2)
Total CHOL/HDL Ratio: 4.4
Total Protein: 7.6 g/dL (ref 6.0–8.3)
Triglycerides: 100 mg/dL (ref <150)
VLDL: 20 mg/dL (ref 0–40)

## 2009-03-20 DIAGNOSIS — I25810 Atherosclerosis of coronary artery bypass graft(s) without angina pectoris: Secondary | ICD-10-CM | POA: Insufficient documentation

## 2009-03-20 DIAGNOSIS — I10 Essential (primary) hypertension: Secondary | ICD-10-CM | POA: Insufficient documentation

## 2009-03-20 DIAGNOSIS — I152 Hypertension secondary to endocrine disorders: Secondary | ICD-10-CM | POA: Insufficient documentation

## 2009-03-20 DIAGNOSIS — E1159 Type 2 diabetes mellitus with other circulatory complications: Secondary | ICD-10-CM | POA: Insufficient documentation

## 2009-03-20 DIAGNOSIS — E782 Mixed hyperlipidemia: Secondary | ICD-10-CM | POA: Insufficient documentation

## 2009-03-22 ENCOUNTER — Ambulatory Visit: Payer: Self-pay | Admitting: Cardiology

## 2009-03-22 DIAGNOSIS — I2119 ST elevation (STEMI) myocardial infarction involving other coronary artery of inferior wall: Secondary | ICD-10-CM | POA: Insufficient documentation

## 2009-03-23 ENCOUNTER — Encounter: Payer: Self-pay | Admitting: Cardiology

## 2009-06-23 ENCOUNTER — Emergency Department: Payer: Medicare Other | Admitting: Unknown Physician Specialty

## 2009-06-23 IMAGING — CR DG CHEST 1V PORT
1 series · 1 of 1 positions shown · non-contrast
Comparison: none

REASON FOR EXAM: chest pain
COMMENTS:

[view not recorded]
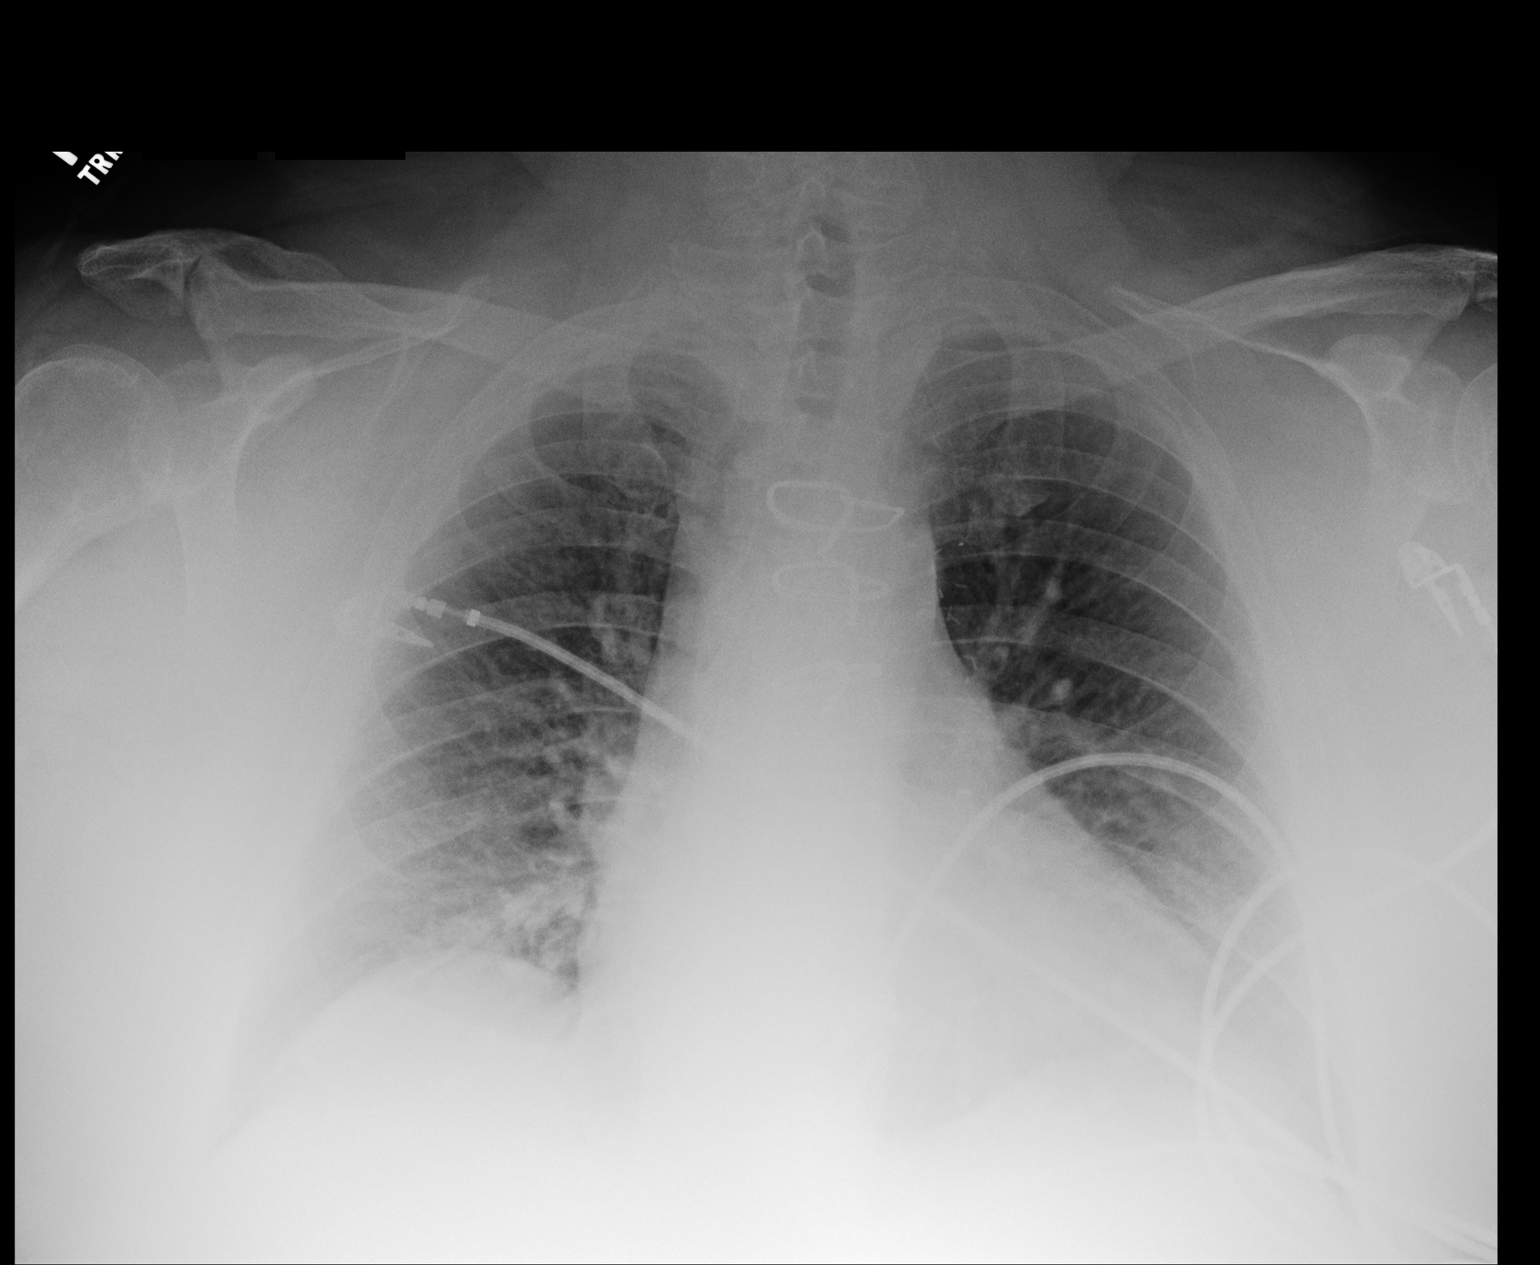

[1 of 1 positions shown; findings below may reference images not displayed]

PROCEDURE:     DXR - DXR PORTABLE CHEST SINGLE VIEW  - [DATE]  [DATE]

RESULT:     Comparison is made to study [DATE].

The lungs are adequately inflated. The interstitial markings are prominent.
The cardiac silhouette is enlarged. The patient has undergone prior median
sternotomy presumably for CABG.
IMPRESSION: The findings suggest low-grade compensated CHF. There has
not been dramatic change since the previous study allowing for differences
in radiographic technique. A followup PA and lateral chest x-ray would be of
value.

## 2009-08-18 ENCOUNTER — Emergency Department: Payer: Medicare Other | Admitting: Emergency Medicine

## 2009-08-18 IMAGING — CT CT ABD-PELV W/O CM
1 of 2 series · 15 of 32 positions shown, 19 images · non-contrast
Comparison: none

REASON FOR EXAM: (1) abd pain; (2) abd pain
COMMENTS:

PROCEDURE:     CT  - CT ABDOMEN AND PELVIS W[DATE]  [DATE]
RESULT:     CT abdomen and pelvis dated [DATE] comparison made to a prior
study dated [DATE].
TECHNIQUE: Helical 3 mm sections were obtained from the lung bases through
the pubic symphysis without administration of oral or intravenous contrast.

[Series 3: 3mm soft tissue · axial · 0.98mm/px · z∈[-182,+252]mm · 15 of 161 slices shown, 19 images]
[im 8/161  soft-tissue]
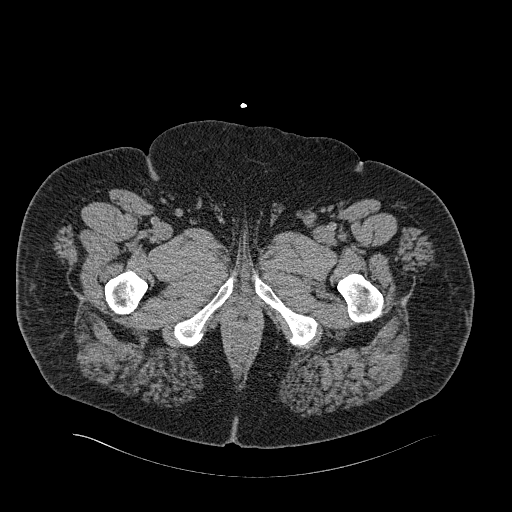
[im 8/161  bone]
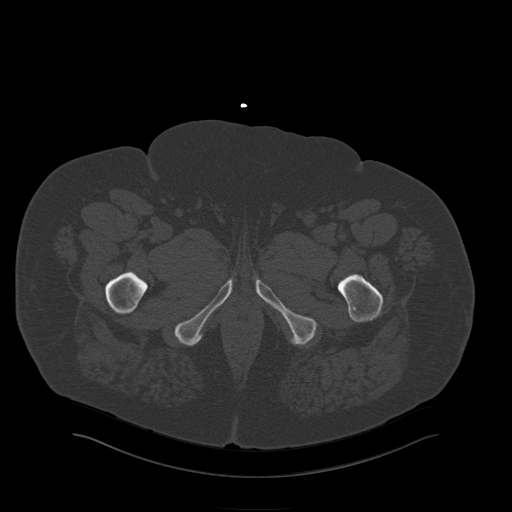
[im 22/161  soft-tissue]
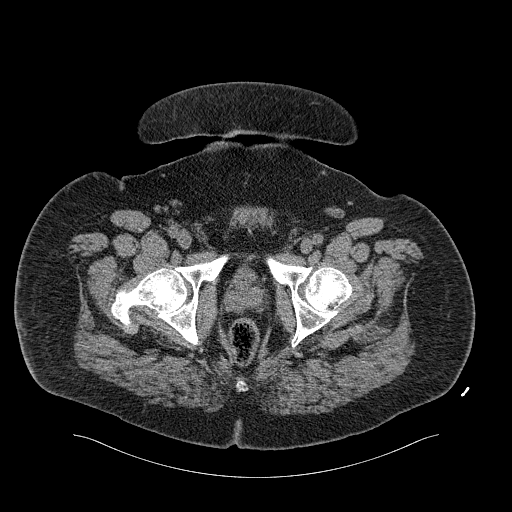
[im 37/161  soft-tissue]
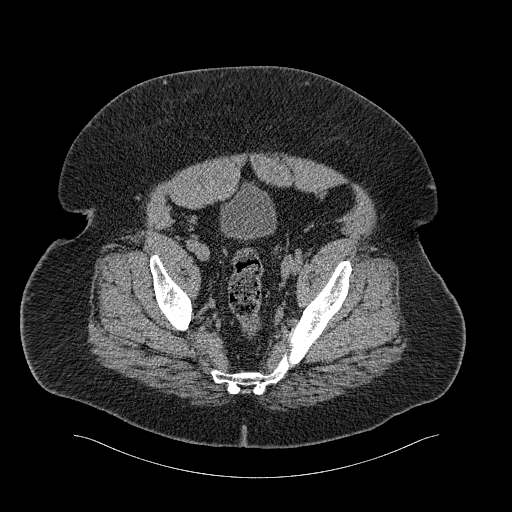
[im 44/161  soft-tissue]
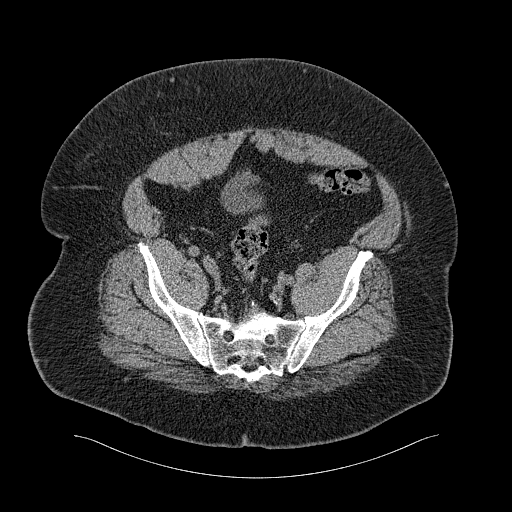
[im 59/161  soft-tissue]
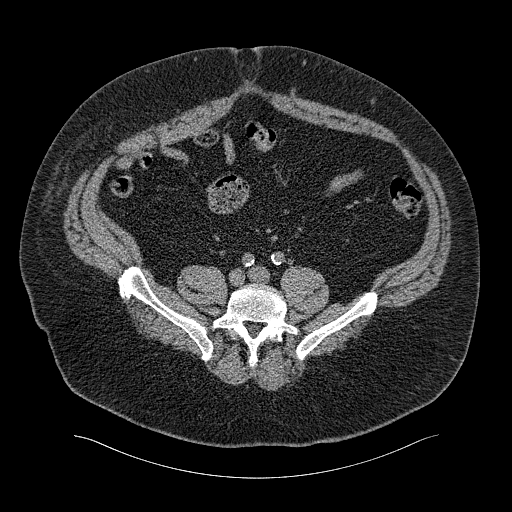
[im 66/161  soft-tissue]
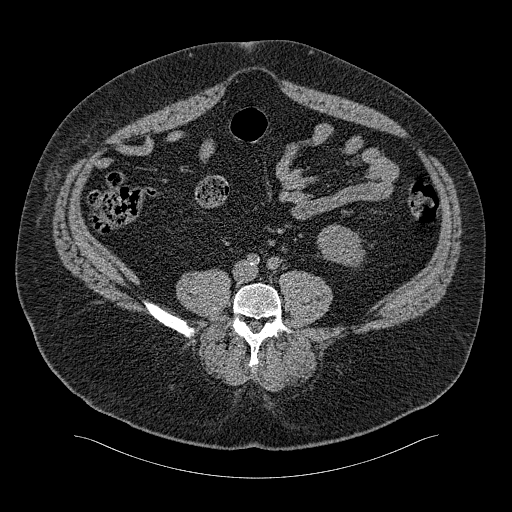
[im 81/161  soft-tissue]
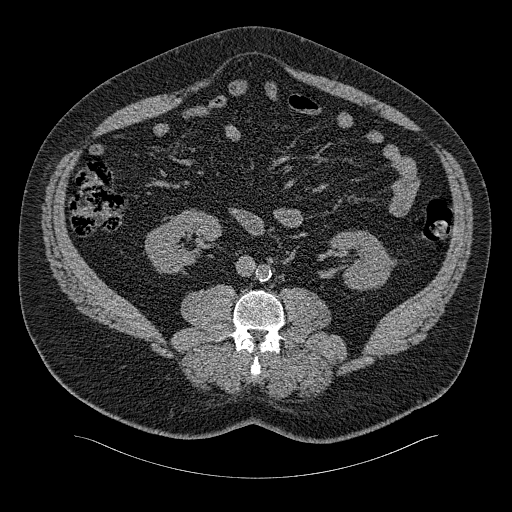
[im 95/161  soft-tissue]
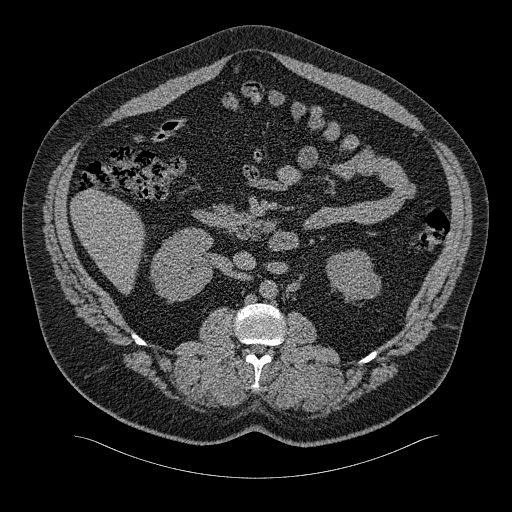
[im 102/161  soft-tissue]
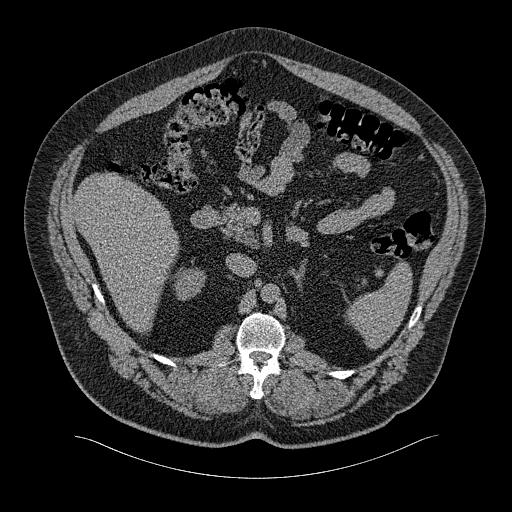
[im 102/161  bone]
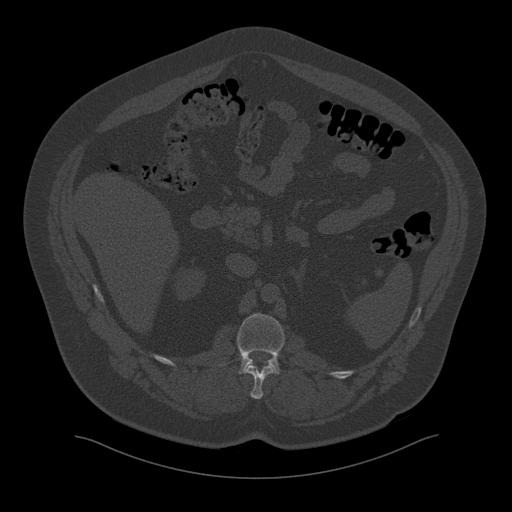
[im 117/161  soft-tissue]
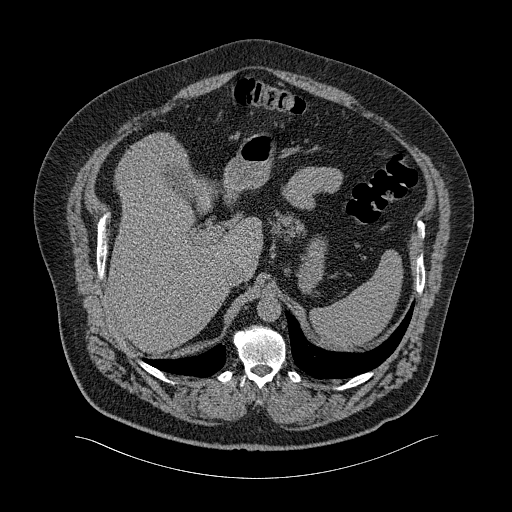
[im 124/161  soft-tissue]
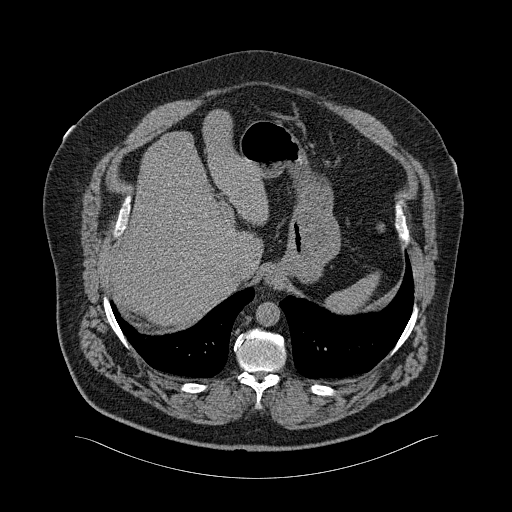
[im 131/161  lung]
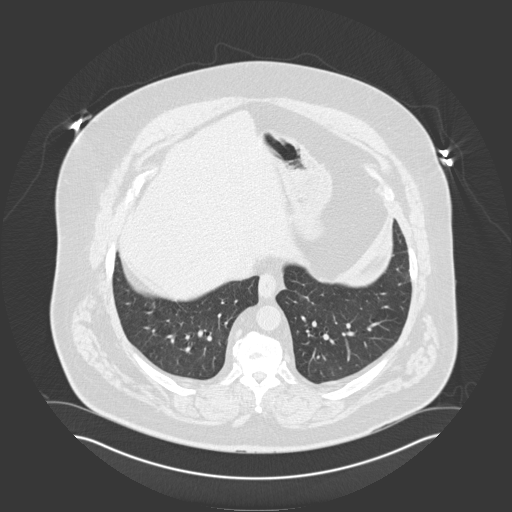
[im 139/161  soft-tissue]
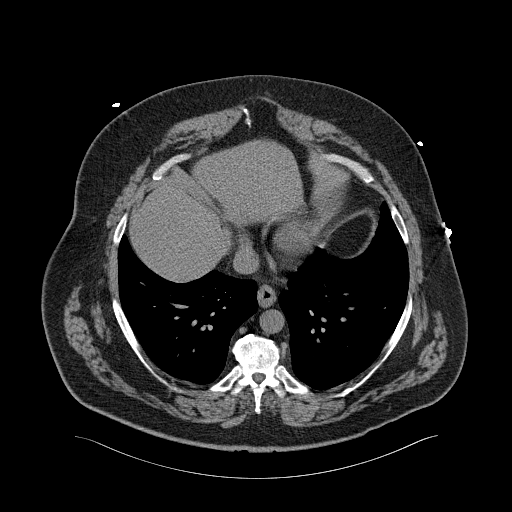
[im 139/161  lung]
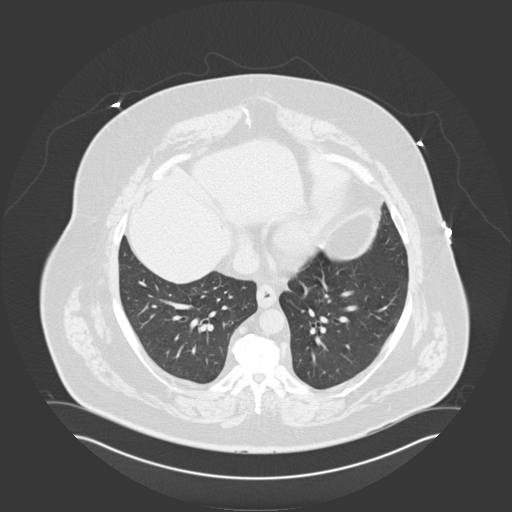
[im 146/161  lung]
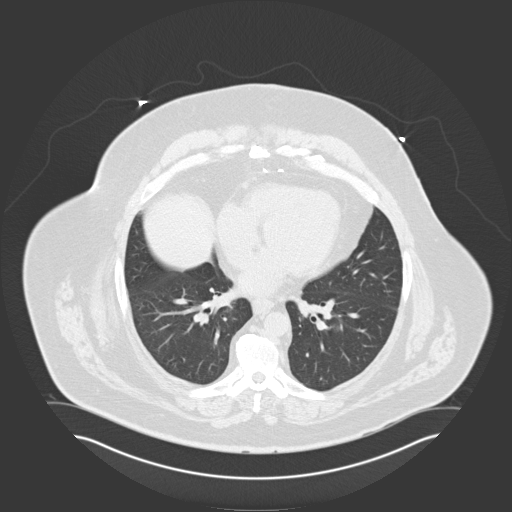
[im 153/161  soft-tissue]
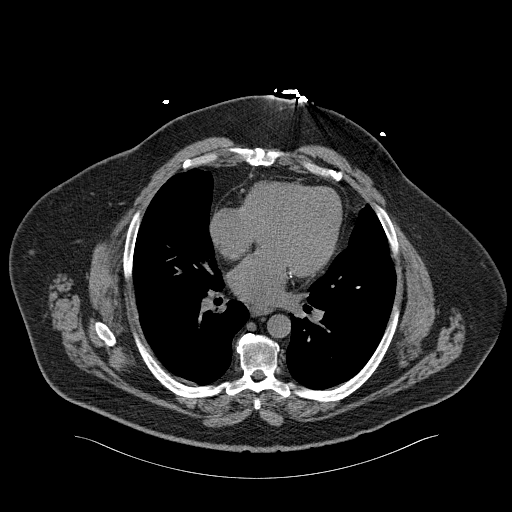
[im 153/161  lung]
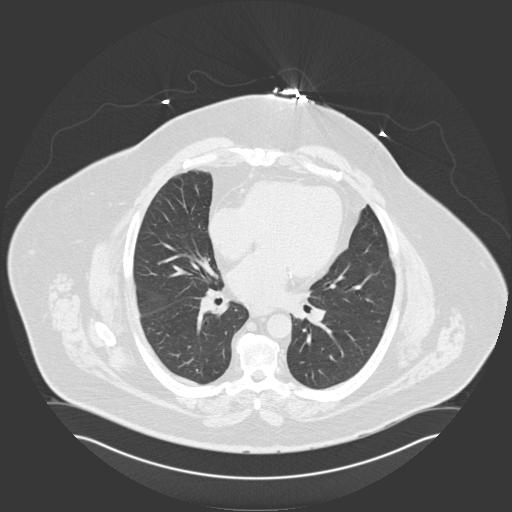

[15 of 32 positions shown; findings below may reference images not displayed]

FINDINGS: Evaluation of the lung bases demonstrates no gross abnormalities.

Noncontrast evaluation of the liver, spleen, adrenals, pancreas, kidneys is
unremarkable. A very small stable 5 to 7 mm exophytic nodule is appreciated
along the posterior midpole region of the right kidney. A small dependent
calcified gallstones identified within the gallbladder. There is no CT
evidence of hydronephrosis, hydroureter, nephrolithiasis nor
ureterolithiasis.

Is no evidence of abdominal or pelvic free fluid, masses, adenopathy, nor
loculated fluid collections. There is no CT evidence of bowel obstruction,
nor secondary signs reflecting colitis, diverticulitis, enteritis. The
appendix is identified and is unremarkable. There is no CT evidence of
abdominal aortic aneurysm. These findings are made within the limitations of
a noncontrasted CT.
IMPRESSION: No CT evidence of obstructive nor inflammatory
abnormalities in the abdomen or pelvis.
2. Stable very small exophytic nodule involving the left kidney is there is
too small for characterization.

## 2009-08-18 IMAGING — CR DG CHEST 2V
1 series · 2 of 2 positions shown · non-contrast
Comparison: none

REASON FOR EXAM: dyspnea
COMMENTS:

[Series 1: view not recorded · 0.17mm/px · 2 of 2 slices shown]
[im 1/2]
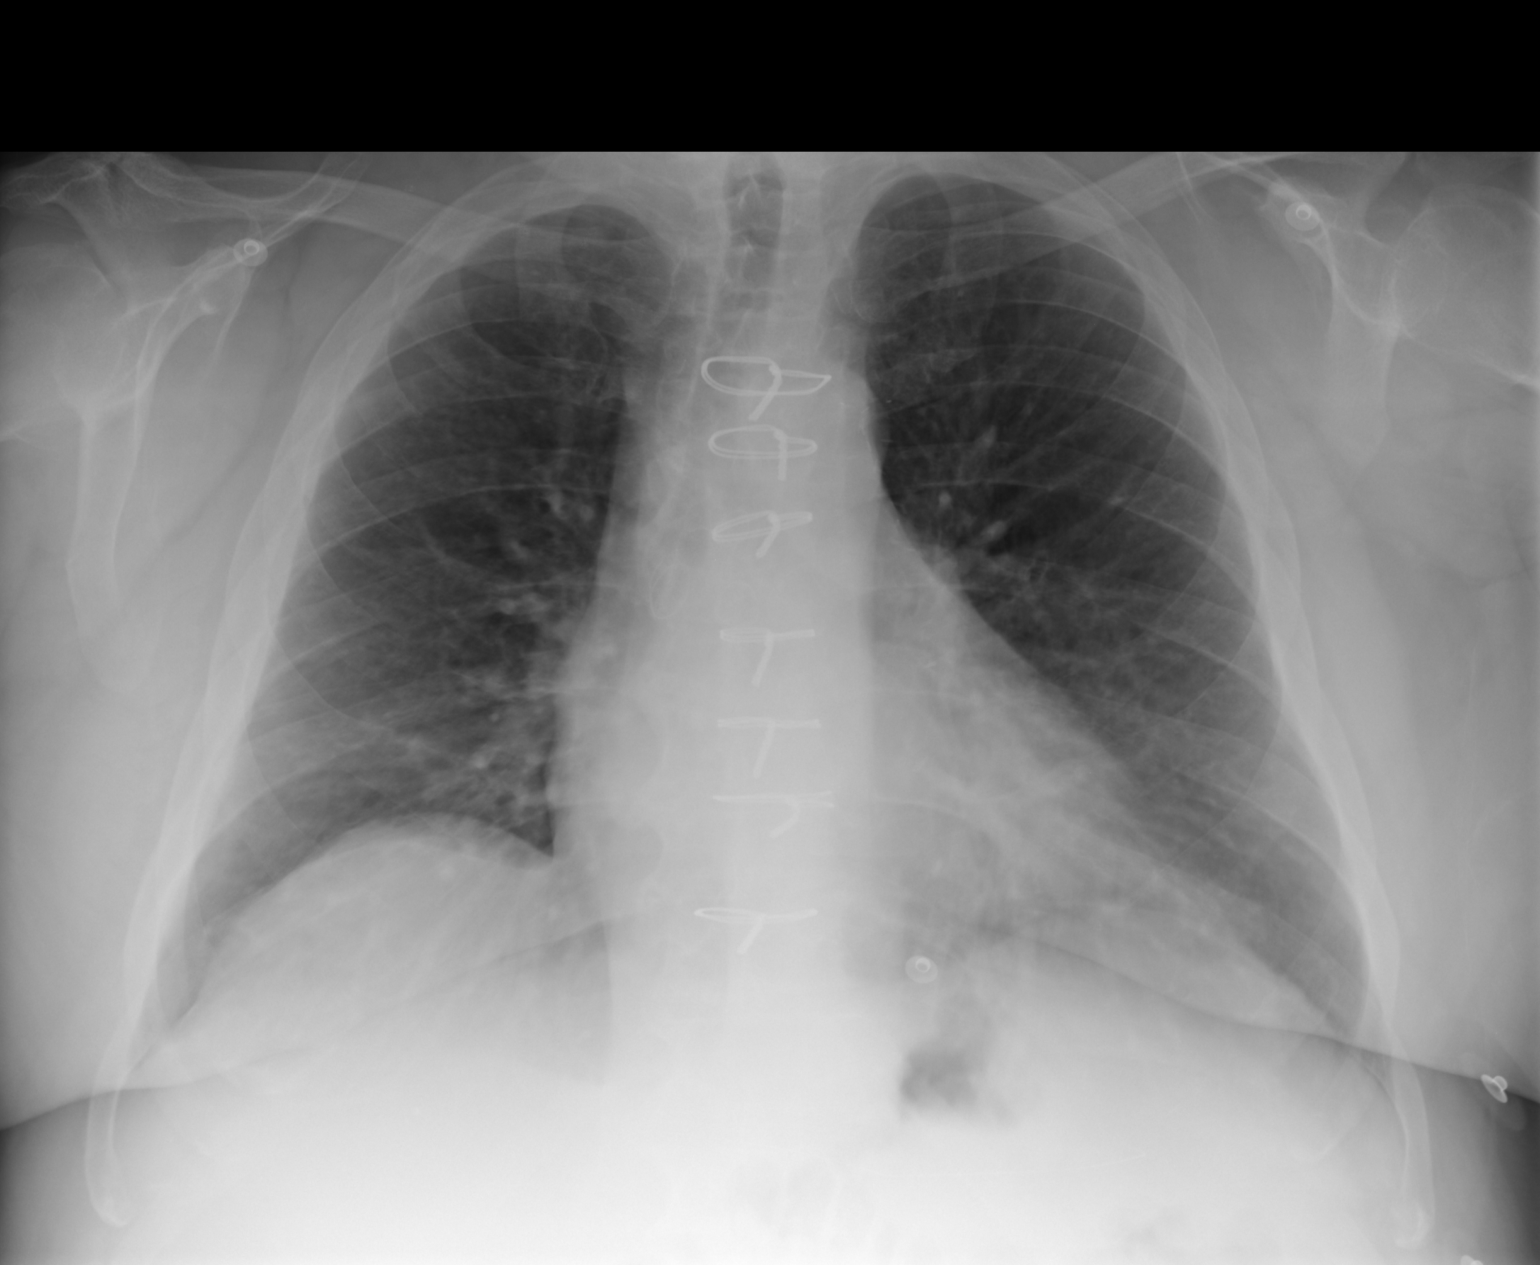
[im 2/2]
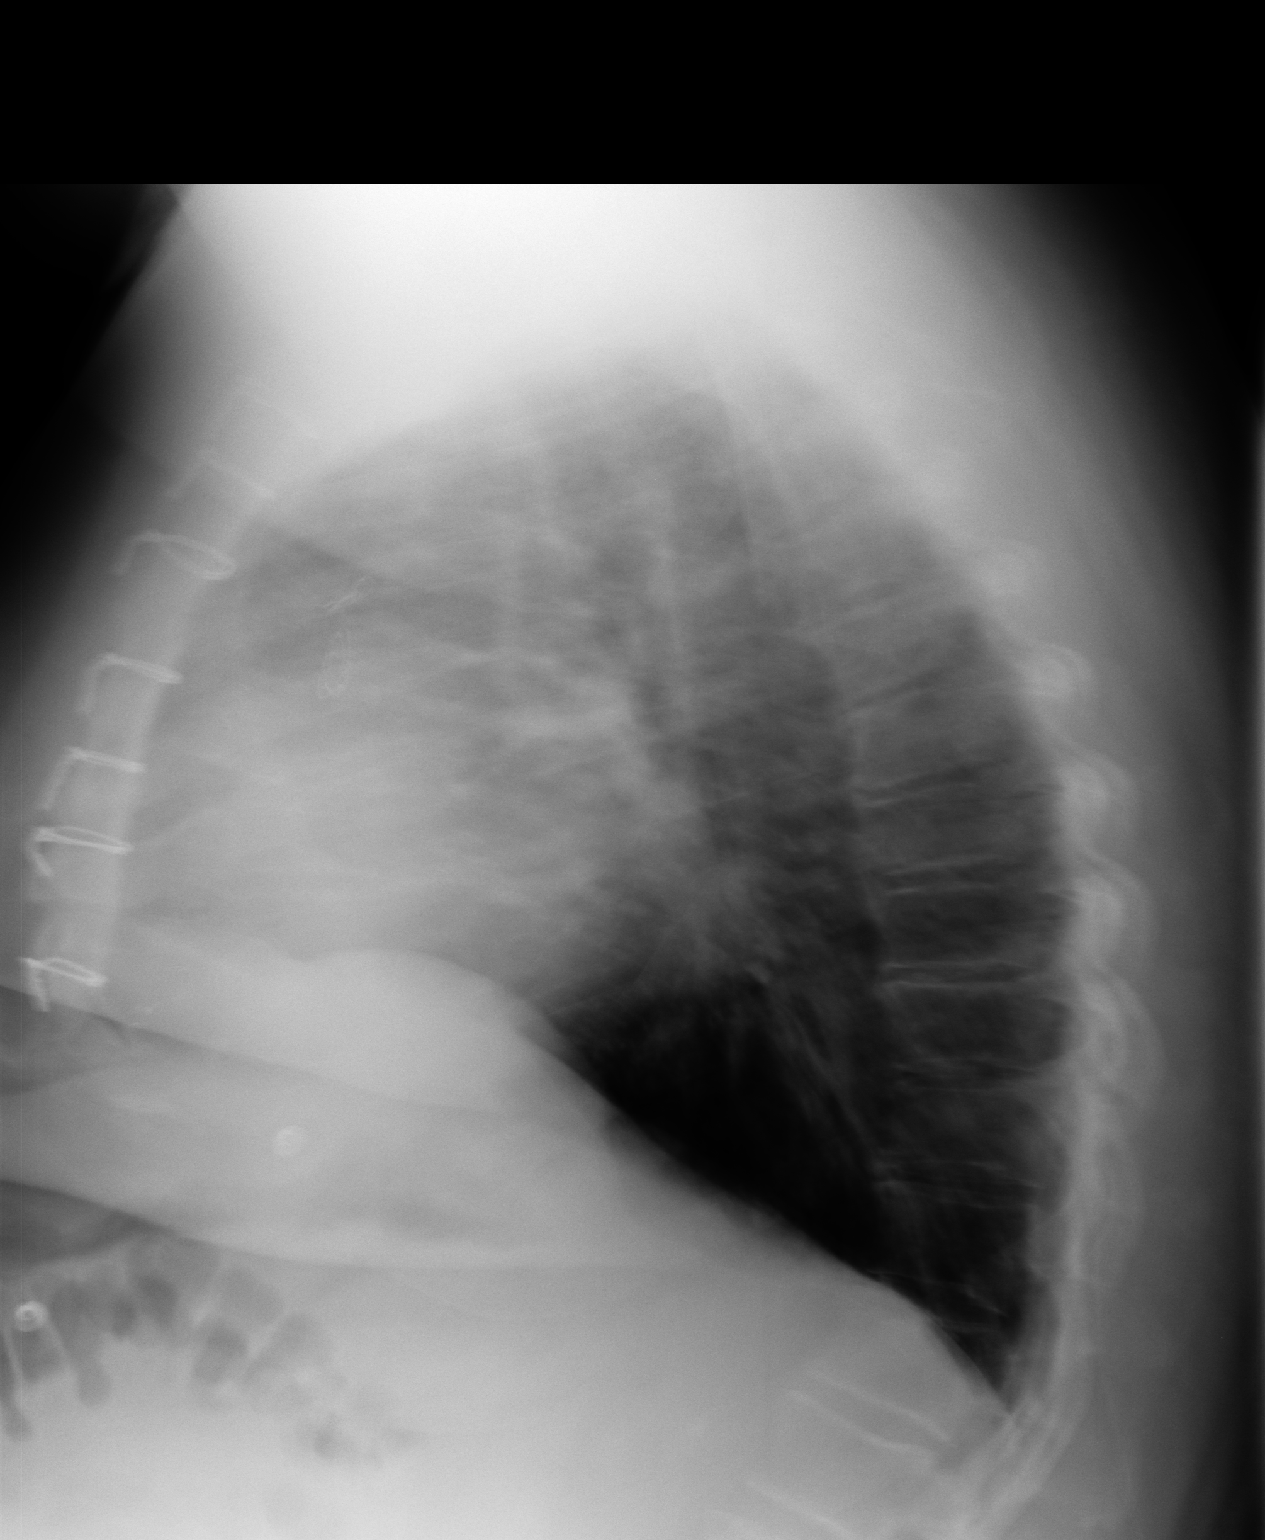

[2 of 2 positions shown; findings below may reference images not displayed]

PROCEDURE:     DXR - DXR CHEST PA (OR AP) AND LATERAL  - [DATE]  [DATE]

RESULT:     Comparison is made to a prior exam of [DATE].

The lung fields are clear. The previously noted pulmonary vascular
congestion is no longer seen. The heart, mediastinal and osseous structures
show no acute changes. Post operative changes of prior CABG are observed.
IMPRESSION: No acute changes are identified.

## 2010-03-02 ENCOUNTER — Ambulatory Visit: Payer: Self-pay | Admitting: Cardiology

## 2010-03-02 DIAGNOSIS — E663 Overweight: Secondary | ICD-10-CM | POA: Insufficient documentation

## 2010-03-02 DIAGNOSIS — I252 Old myocardial infarction: Secondary | ICD-10-CM | POA: Insufficient documentation

## 2010-03-04 ENCOUNTER — Emergency Department: Payer: Medicare Other | Admitting: Emergency Medicine

## 2010-03-05 IMAGING — CR DG CHEST 1V PORT
1 series · 1 of 1 positions shown · non-contrast
Comparison: none

REASON FOR EXAM: shortness of breath
COMMENTS:

[view not recorded]
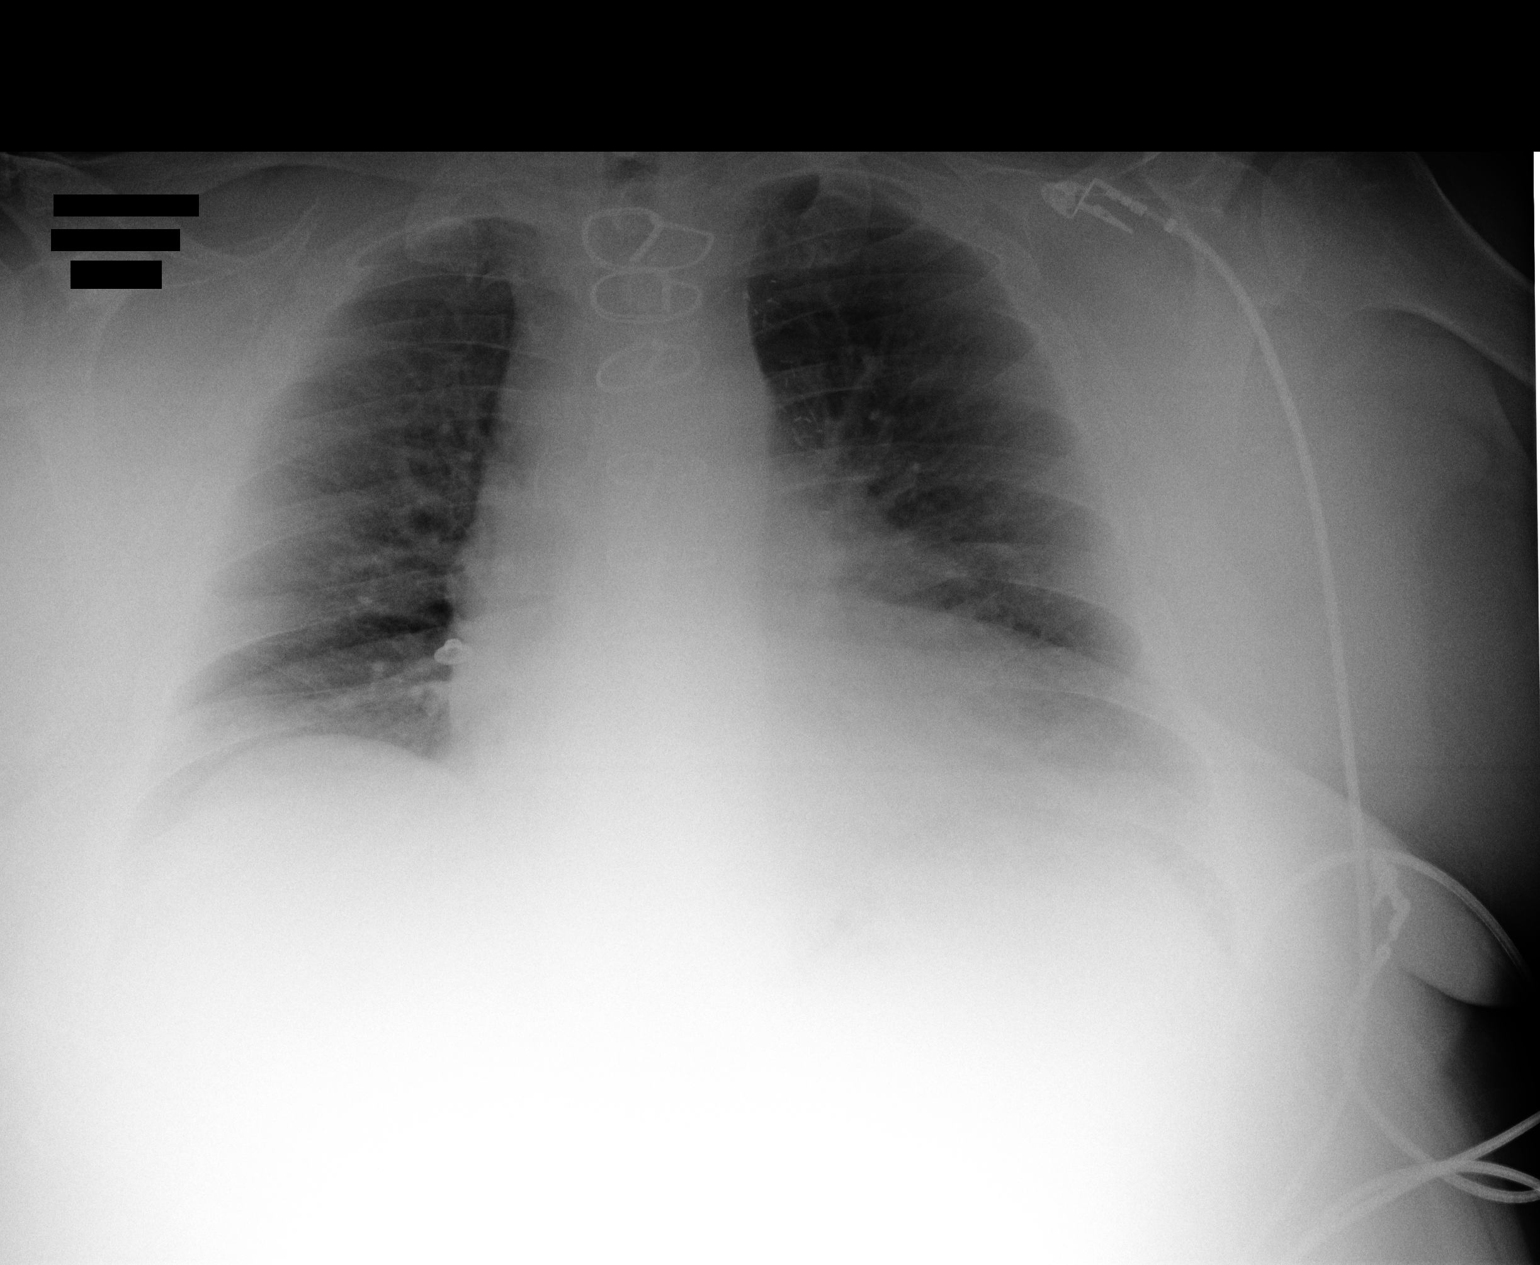

[1 of 1 positions shown; findings below may reference images not displayed]

PROCEDURE:     DXR - DXR PORTABLE CHEST SINGLE VIEW  - [DATE]  [DATE]

RESULT:     Comparison is made to a prior study dated [DATE].

The patient has taken a shallow inspiration. There is otherwise no evidence
of focal infiltrates, effusions or edema. The cardiac silhouette is
enlarged. The visualized bony skeleton is unremarkable.
IMPRESSION: 1. Shallow inspiration.
2. No evidence of acute cardiopulmonary disease.

## 2010-04-13 ENCOUNTER — Ambulatory Visit: Payer: Self-pay | Admitting: Cardiology

## 2010-04-15 LAB — CONVERTED CEMR LAB
ALT: 21 units/L (ref 0–53)
AST: 20 units/L (ref 0–37)
Albumin: 3.9 g/dL (ref 3.5–5.2)
Alkaline Phosphatase: 89 units/L (ref 39–117)
BUN: 18 mg/dL (ref 6–23)
Bilirubin, Direct: 0.1 mg/dL (ref 0.0–0.3)
CO2: 27 meq/L (ref 19–32)
Calcium: 9.1 mg/dL (ref 8.4–10.5)
Chloride: 106 meq/L (ref 96–112)
Cholesterol: 260 mg/dL — ABNORMAL HIGH (ref 0–200)
Creatinine, Ser: 0.8 mg/dL (ref 0.4–1.5)
Direct LDL: 207.8 mg/dL
GFR calc non Af Amer: 108 mL/min (ref >60)
Glucose, Bld: 91 mg/dL (ref 70–99)
HDL: 41 mg/dL (ref >39.00)
Potassium: 4.6 meq/L (ref 3.5–5.1)
Sodium: 140 meq/L (ref 135–145)
Total Bilirubin: 0.7 mg/dL (ref 0.3–1.2)
Total CHOL/HDL Ratio: 6
Total Protein: 6.8 g/dL (ref 6.0–8.3)
Triglycerides: 118 mg/dL (ref 0.0–149.0)
VLDL: 23.6 mg/dL (ref 0.0–40.0)

## 2010-06-27 ENCOUNTER — Ambulatory Visit: Payer: Self-pay | Admitting: Cardiology

## 2010-09-06 NOTE — Assessment & Plan Note (Signed)
Summary: f1y/jss   Visit Type:  1 yr f/u Primary Provider:  [name]  CC:  pt states he has been having stomach cramps but says this has been going on for a while...states he has also had a panic attack...edema/feet...denies any cp or sob..pt states he has been having what he calls a stigmatism in his right eye says sometimes happens in left eye as well says this goes away when he takes an aspirin.  History of Present Illness: Evan [name] returns today for evaluation of his coronary artery disease, history of prior bypass grafting over a year ago, hyperlipidemia, and obesity.  He denies any angina or chest pain. He does have a little chest Ladonte Verstraete soreness from his incision.  He's had no orthopnea, PND or increased edema. He's had some abdominal cramps and leg cramps when he overdoes it. He denies any true claudication.  He is on 80 mg of simvastatin. He is due blood work.  Current Medications (verified): 1)  Simvastatin 80 Mg Tabs (Simvastatin) .... Take 1/2 Tablet By Mouth Daily At Bedtime 2)  Aspirin 81 Mg Tbec (Aspirin) .... Take One Tablet By Mouth Daily 3)  Ranitidine Hcl 150 Mg Caps (Ranitidine Hcl) .Marland Kitchen.. 1 By Mouth Two Times A Day 4)  Metoprolol Tartrate 25 Mg Tabs (Metoprolol Tartrate) .... Take One Tablet By Mouth Twice A Day  Allergies (verified): No Known Drug Allergies  Past History:  Past Medical History: Last updated: 03/20/2009 CAD Hypertension Hyperlipidemia Gastroesophageal reflux Obstructive sleep apnea Remote history of pneumonia Childhood history of scarlet fever Obesity  Past Surgical History: Last updated: 03/20/2009 CABG   Family History: Last updated: 03/20/2009 Family History of Coronary Artery Disease:   Social History: Last updated: 03/20/2009 Disabled  Single  Tobacco Use - No.  Alcohol Use - no  Risk Factors: Smoking Status: never (03/20/2009)   Review of Systems       negative other than history of present illness  Vital  Signs:  Patient profile:   55 year old male Height:      65 inches Weight:      280 pounds BMI:     46.76 Pulse rate:   78 / minute Pulse rhythm:   irregular BP sitting:   128 / 76  (left arm) Cuff size:   large  Vitals Entered By: Danielle Rankin, CMA (March 02, 2010 10:55 AM)  Physical Exam  General:  obese.   Head:  normocephalic and atraumatic Eyes:  PERRLA/EOM intact; conjunctiva and lids normal. Neck:  Neck supple, no JVD. No masses, thyromegaly or abnormal cervical nodes. Chest Latorya Bautch:  well-healed median sternotomy Lungs:  Clear bilaterally to auscultation and percussion. Heart:  PMI difficult to appreciate, regular rate and rhythm, normal S1-S2, no carotid bruits Msk:  Back normal, normal gait. Muscle strength and tone normal. Pulses:  pulses normal in all 4 extremities Extremities:  1+ left pedal edema and 1+ right pedal edema.   Neurologic:  Alert and oriented x 3. Skin:  Intact without lesions or rashes. Psych:  anxious.     Problems:  Medical Problems Added: 1)  Dx of Old Myocardial Infarction  (ICD-412) 2)  Dx of Overweight/obesity  (ICD-278.02)  EKG  Procedure date:  03/02/2010  Findings:      normal sinus rhythm, old inferior Alba Perillo infarct, ST segment changes laterally, no acute changes.  Impression & Recommendations:  Problem # 1:  CORONARY ATHEROSCLEROSIS, ARTERY BYPASS GRAFT (ICD-414.04) Assessment Unchanged  His updated medication list for this problem includes:  Aspirin 81 Mg Tbec (Aspirin) .Marland Kitchen... Take one tablet by mouth daily    Metoprolol Tartrate 25 Mg Tabs (Metoprolol tartrate) .Marland Kitchen... Take one tablet by mouth twice a day  Problem # 2:  OLD MYOCARDIAL INFARCTION (ICD-412) Assessment: Unchanged  Problem # 3:  HYPERLIPIDEMIA, MIXED (ICD-272.2) He is to lipids. I decreased his simvastatin to 40 mg per guidelines. We'll check blood work in 6-8 weeks. We may switch him to Lipitor on next visit when it becomes generic. He can clearly not afford brand  name drugs. His updated medication list for this problem includes:    Simvastatin 80 Mg Tabs (Simvastatin) .Marland Kitchen... Take 1/2 tablet by mouth daily at bedtime  Problem # 4:  OVERWEIGHT/OBESITY (ICD-278.02) Assessment: Unchanged  Patient Instructions: 1)  Your physician recommends that you schedule a follow-up appointment in: 12 months with Dr Daleen Squibb 2)  Your physician recommends that you return for lab work in: 6 weeks FASTING lipid,liver,bmp  272.4 414.04 3)  Your physician has recommended you make the following change in your medication: decrease your Simvastatin to 40 mg nightly (1/2 80mg  tablet)

## 2010-10-04 ENCOUNTER — Telehealth: Payer: Self-pay | Admitting: Cardiology
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
## 2010-12-20 NOTE — Assessment & Plan Note (Signed)
OFFICE VISIT   Evan [name]  DOB:  09/05/1955                                        April 20, 2008  CHART #:  [id number]   HISTORY:  The patient is a 55 year old obese white male with known  history of coronary artery disease as well as other multiple risk  factors including hypertension, hyperlipidemia, and obstructive sleep  apnea, having undergone previous stenting of his right coronary artery  in 1998.  He has had chronic stable angina and normal left ventricular  function.  He presented recently with increasing progressive symptoms of  fatigue and chest pain with some radiation to his left arm.  He was  evaluated at [facility] and had negative cardiac enzymes  ruling out from myocardial infarction, but catheterization showed severe  three-vessel disease.  He was referred to Evan Croon, MD for a  surgical opinion.  He subsequently underwent a coronary artery bypass  grafting x6 on 03/23/2008, by Evan [name].  For full details, please see  the operative report.  He is now approximately 3 weeks post discharge  and overall he feels like he is making adequate progress.  He does have  some shortness of breath and occasional cough, but this is very  intermittent in nature.  He denies any significant difficulties with his  incisions, although there is a small area of separation in the sternal  incision that is slowly healing by secondary intention and has had no  evidence of infection to include fevers or other constitutional  symptoms.  He denies any current trouble with angina.  He has had some  ongoing lower extremity edema.  He is increasing his walking daily and  his exercise tolerance is slowly improving.  He has been sleeping mostly  in a chair, but recently in the last 4 days or so, he was able to sleep,  reasonably comfortably in his bed.  Overall, he is pleased with his  study progress.   CHEST X-RAY:  Chest x-ray was  obtained on today's date.  This revealed  cardiomegaly and pulmonary vascular congestion with a minimal sized left  pleural effusion.   PHYSICAL EXAMINATION:  VITAL SIGNS:  Blood pressure 133/85, pulse 75,  respirations 18, and oxygen saturation is 98% on room air.  PULMONARY:  Clear breath sounds throughout.  CARDIAC:  Regular rate and rhythm.  Normal S1 and S2.  No murmurs,  gallops, or rubs.  ABDOMEN:  Obese.  EXTREMITY:  Positive edema, right greater than lower extremity.  SKIN:  Incisions are all healing well without evidence of infection.   ASSESSMENT:  The patient is making adequate progress following his  surgical revascularization.  He is scheduled to see Dr. Daleen [name], tomorrow  in Cardiology followup.  Due to his chest x-ray findings and peripheral  edema, I have given him prescription for Lasix 40 mg daily for 10 days  and potassium 20 mEq daily for 10 days.  This certainly may be modified  as he follows up with his cardiologist overtime.  He is encouraged to  continue ambulating.  I have discussed driving with him and he can  return to driving, but he does understand the ongoing lifting  restrictions as his sternum continues to heal.  From our regard, it is  not necessary to continue to follow him unless  he has any further  surgical issues for which we would be happy to see him in the future.  I  did discuss some lifestyle risk modifications that he should continue  including efforts to lose weight, monitor his blood pressure as well as  cholesterol frequently.   Evan [name], P.A.-C.   Evan [name]  D:  04/20/2008  T:  04/21/2008  Job:  [id number]   cc:   Evan [name], M.D.  Evan C. Wall, MD, [facility]

## 2010-12-20 NOTE — Consult Note (Signed)
NAME:  ADEWALE, PUCILLO              ACCOUNT NO.:  [id number]   MEDICAL RECORD NO.:  [id number]          PATIENT TYPE:  INP   LOCATION:  4705                         FACILITY:  [facility]   PHYSICIAN:  Salvatore Decent. Cornelius Moras, M.D. DATE OF BIRTH:  [birth year]   DATE OF CONSULTATION:  03/20/2008  DATE OF DISCHARGE:                                 CONSULTATION   REQUESTING PHYSICIAN:  Veverly Fells. Excell Seltzer, MD   PRIMARY CARDIOLOGIST:  Jesse Sans. Daleen Squibb, MD, [facility]   PRIMARY CARE PHYSICIAN:  Riley Lam. Chelminski, MD at the [facility] in  [place].   REASON FOR CONSULTATION:  Severe 3-vessel coronary artery disease.   HISTORY OF PRESENT ILLNESS:  Mr. Saber is a 55 year old obese white  male with known history of coronary artery disease, hypertension,  hyperlipidemia, GE reflux disease, and obstructive sleep apnea.  The  patient's cardiac history dates back to 1998, at which time he presented  with angina pectoris and underwent percutaneous coronary intervention  and stenting of the right coronary artery.  Since then, he apparently  has had some symptoms of chronic stable angina.  He has been followed  with known stable normal left ventricular function.  The patient  describes a 40-month history of progressive fatigue.  On the morning of  March 18, 2008, he was awoked from the sleep with severe substernal  chest pain radiating to his left arm.  This pain was much more severe  than any episodes he is having quite some time, and the pain persisted.  He was brought via EMS to [facility] where the  patient was notably hypertensive.  Baseline electrocardiograms revealed  normal sinus rhythm with slight ST-segment elevation in the inferior  leads.  Cardiac enzymes were normal.  The patient was admitted to the  hospital and chest pain resolved with heparin and nitrates.  The patient  ruled out for acute myocardial infarction based on serial cardiac  enzymes.  The patient underwent cardiac  catheterization by Dr. Tonny Bollman.  Findings at the time of catheterization demonstrate severe 3-  vessel coronary artery disease with mild left ventricular dysfunction.  Mr. Lemme was subsequently transferred to [facility] for possible surgical revascularization.   REVIEW OF SYSTEMS:  GENERAL:  The patient reports progressive fatigue  and generalized weakness.  His appetite is stable.  He has not been  gaining or losing weight recently.  He is 5 feet 5 inches tall and  weighs roughly 271 pounds.  CARDIAC:  Notable for chronic symptoms of  mild angina as well as chronic mild exertional shortness of breath.  These symptoms have been stable for some time.  The patient reports that  he is disabled due to his longstanding cardiac disease dating back to  42.  The patient denies resting shortness of breath, PND, orthopnea,  or lower extremity edema.  He has had some dizzy spells, but no syncopal  episodes.  RESPIRATORY:  Negative.  The patient denies productive cough,  hemoptysis, or wheezing.  The patient does use CPAP at night for sleep.  GASTROINTESTINAL:  Negative.  The patient has no difficulty swallowing.  He reports normal bowel function.  HEMATOLOGIC:  Negative.  MUSCULOSKELETAL:  Negative.  NEUROLOGIC:  Notable for transient episode  of numbness involving both legs in the past.  The patient denies any  transient lateralizing symptoms or signs.  GENITOURINARY:  Negative.  PSYCHIATRIC:  Negative.   PAST MEDICAL HISTORY:  1. Coronary artery disease.  2. Hypertension.  3. Hyperlipidemia.  4. GE reflux disease.  5. Obstructive sleep apnea.  6. Pneumonia in the distant past.  7. Scarlet fever during childhood.   PAST SURGICAL HISTORY:  None.   MEDICATIONS PRIOR TO ADMISSION:  1. Aspirin 81 mg daily.  2. Lovastatin 40 mg daily.  3. Toprol-XL 25 mg daily.  4. Sublingual nitroglycerin as needed.   DRUG ALLERGIES:  1. PENICILLIN.  2. SULFA.   FAMILY  HISTORY:  There is a history of coronary artery disease, but no  history of premature coronary artery disease.   SOCIAL HISTORY:  The patient lives alone and has been disabled since  76.  He is a nonsmoker.  He denies alcohol use.  He lives a sedentary  lifestyle.  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   PHYSICAL EXAMINATION:  The patient is an obese white male appears his  stated age in no acute distress.  He is afebrile.  HEENT exam is grossly  unrevealing.  Neck is supple.  There are no palpable lymphadenopathy.  There are no carotid bruits.  Auscultation of the chest reveals clear  breath sounds anteriorly.  No wheezes or rhonchi are noted.  Cardiovascular exam includes regular rate and rhythm.  No murmurs, rubs,  or gallops noted.  The abdomen is obese, soft, and nontender.  There are  no obvious masses.  Extremities are warm and adequately perfused.  There  is no lower extremity edema.  Distal pulses are palpable in the  posterior tibial position.  Rectal and GU exams are both deferred.  Neurologic examination is grossly nonfocal.   DIAGNOSTIC TESTS:  Cardiac catheterization performed by Dr. Excell Seltzer is  reviewed.  This demonstrates diffusely calcified epicardial coronary  arteries with diffuse coronary artery disease suggestive of longstanding  diabetes.  The patient has 30-50% stenosis of the distal left main  coronary artery.  There are serial lesions in the proximal left  circumflex coronary artery with 80-95% stenosis.  There is a 100%  proximal occlusion of first circumflex marginal branch and 80-90%  stenosis of the second circumflex marginal branch.  There is 90%  proximal stenosis of a large first high diagonal branch and 80-90%  proximal stenosis of a second diagonal branch.  There is at least 90%  stenoses in several places in the mid and distal left anterior  descending coronary artery.  The left anterior descending coronary  artery  wraps at the apex of the heart.  It is diffusely diseased.  There  is a patent stent in the proximal right coronary artery with 50%  stenosis at the tail end of the stent.  There is 60-70% stenosis of the  mid right coronary artery.  The left ventricular function appears fairly  normal with ejection fraction estimated greater than 50%.   IMPRESSION:  Severe 3-vessel coronary artery disease with diffusely  diseased vessel and mild left ventricular dysfunction.  Mr. Toothman  presents with acute coronary syndrome.  I suspect that he would best be  treated with surgical revascularization.  The risks of surgery will  be  increased due to the presence of diffuse coronary artery disease and the  patient's other comorbid medical problems including significant obesity.   PLAN:  I have discussed matters briefly with Mr. Lebleu this  afternoon.  I will be away next week and we will tentatively plan for  surgery early next week by one of my partners.  All their questions have  been addressed.  Mr.  Mabry understands and accepts all associated risks of surgery  including but not limited to risk of death, stroke, myocardial  infarction, congestive heart failure, respiratory failure, pneumonia,  bleeding requiring blood transfusion, arrhythmia, infection, and  recurrent coronary artery disease.      Salvatore Decent. Cornelius Moras, M.D.  Electronically Signed     CHO/MEDQ  D:  03/20/2008  T:  03/21/2008  Job:  [id number]   cc:   Thomas C. Daleen Squibb, MD, [facility]  Veverly Fells. Excell Seltzer, MD  [facility] in [place]

## 2010-12-20 NOTE — Assessment & Plan Note (Signed)
[name]                            [place] OFFICE NOTE   Evan [name]                     MRN:          [id number]  DATE:04/21/2008                            DOB:          [birth year]    Evan [name] comes in today after being discharged in the hospital with  unstable angina and subsequent coronary bypass grafting.  He had 6  bypass grafts placed by Dr. Evelene Croon on March 23, 2008.  At that  time, left internal mammary graft was placed to the LAD, vein graft to  diagonal, off the LAD, sequential vein graft to intermediate, first  marginal, second marginal which were branches of the left circumflex and  a saphenous vein graft to the right coronary artery.   He had good LV function prior to the procedure.  Unfortunately, a lot of  diffuse distal disease and not optimal targets.  I have discussed this  quite openly with him today particularly in regards to compliance with  his statin and his aspirin.   He says he has worked out away now with [facility] to get his  medicines for 4 dollars a month.  Aspirin should not be, but a dollar a  month.  He needs to change his lifestyle dramatically including losing  substantial weight.  I suggest he become a vegetarian.   MEDICATIONS:  1. Ranitidine 150 mg p.o. b.i.d.  2. Enteric-coated aspirin 81 mg b.i.d.  3. Metoprolol 12.5 b.i.d.  4. Lovastatin 40 mg q.h.s.   PHYSICAL EXAMINATION:  VITAL SIGNS:  Blood pressure today is 140/78, his  pulse is 83 and regular, his weight is 262.  He has lost about 15 pounds  to 18 pounds since surgery.  HEENT:  Normal.  NECK:  Carotid upstrokes were equal bilateral bruits.  No JVD.  Thyroid  is not enlarged.  Trachea is midline.  LUNGS:  Clear to auscultation.  HEART:  Reveals soft S1-S2.  Median sternotomy has a scab in the middle  of the sternum, but it is not draining or has any signs of infection.  His sternum is stable.  ABDOMEN:  Protuberant.   Organomegaly was difficult to assess.  EXTREMITIES:  Reveal no edema.  Pulses are present with 2+/4+ dorsalis  pedis and posterior tibial pulses.  NEUROLOGIC:  Intact.   I had a long talk with Evan [name] today.  I have reiterated the importance of  taking his medicines and changing his lifestyle.  Otherwise, I think he  is going to have early graph failures.  I have  increased his metoprolol to 25 b.i.d.  We will see him back in 6 weeks  for fasting blood work and a comprehensive metabolic panel with a lipid  panel.     Evan C. Daleen Squibb, MD, [facility]  Electronically Signed    TCW/MedQ  DD: 04/21/2008  DT: 04/22/2008  Job #: [id number]

## 2010-12-20 NOTE — H&P (Signed)
NAME:  Evan [name], Evan [name]              ACCOUNT NO.:  [id number]   MEDICAL RECORD NO.:  [id number]          PATIENT TYPE:  INP   LOCATION:  4705                         FACILITY:  [facility]   PHYSICIAN:  Thomas C. Wall, MD, FACCDATE OF BIRTH:  09-24-1955   DATE OF ADMISSION:  03/20/2008  DATE OF DISCHARGE:                              HISTORY & PHYSICAL   Evan [name] is a 55 year old white male who I followed for a number  of years with known coronary artery disease.  He was admitted to  [facility] with substernal chest pain that woke  him from sleep.  This was on March 18, 2008.   He has always had a lot of chronic chest pain.  It has been very  difficult to follow in the outpatient arena.  He has also been  noncompliant for both volitional reasons, as well as financial reasons.   He ruled out for myocardial infarction as I understand it at [facility]  It was decided after talking to Dr. Graciela Husbands that he undergo a  cardiac catheterization.   His catheterization was done by Dr. Tonny Bollman on March 19, 2008,  at [facility].  He had a patent RCA stent with a  right dominant system, a 40% distal left main, small LAD with severe  atherosclerosis throughout but a 90% lesion in the mid LAD, 80% and  diffuse disease in the distal LAD, 80% in the first diagonal, and 95% in  the circumflex that was hazy and moderately calcified.  Distal circ was  very small.  The second obtuse marginal having 90% lesion that was very  complex, third obtuse marginal had distal vessel supplied by collaterals  from the right coronary artery.  His EF was 55% with mild diaphragmatic  hypokinesia and mild posterobasal hypokinesia.   He was transferred to [facility] for admission, further  treatment, and consideration of coronary artery bypass grafting by Dr.  Tressie Stalker.   His past medical history is significant for hypertension,  hyperlipidemia,  obesity and noncompliance.  He has a history of  gastroesophageal reflux.  He had a history of scarlet fever and  pneumonia.   His medications when he came at [facility] were only aspirin and Toprol-XL  25 mg a day.  He stopped his statin.   He is allergic to PENICILLIN and SULFA.   SOCIAL:  He lives alone.  He has no children.  He is disabled.  He has  some major financial issues.  He uses no alcohol or tobacco.   His review of systems is remarkable for chronic fatigue and weakness.  He has had a lot of dyspnea on exertion.  He denies any orthopnea, PND,  has had no peripheral edema.  Complains of some crampy abdominal pain  upon arrival to [facility].   He has had no recent fever, chills, sweats or weight loss.  Denies any  palpitations, syncope or presyncope.  His other review of systems were  negative.  Also refer to the H&P [name].   On arrival to [facility], he  is in no acute distress.  Skin is warm and dry.  HEENT:  He is disheveled.  PERRLA.  Extraocular movements intact.  Sclerae clear.  Pupils equal, round, and reactive to light and  accommodation.  Facial symmetry is normal.  Dentition is ill repair.  Carotids upstrokes were equal bilaterally without bruits.  He has no  JVD.  Thyroid is not enlarged.  Trachea is midline.  Lungs are clear.  Heart reveals soft S1-S2.  No gallop.  Abdominal exam is obese with good bowel sounds.  Organomegaly was  difficult to assess.  There was no obvious midline pulsation.  Extremities reveal pulse to be 2+/4+ in the femorals, and distal pulses  were 1+.  There was no clubbing, cyanosis or edema.  Neurologically, was  grossly intact.  Skin is warm and dry.   Laboratory data and electrocardiogram as reported in the chart from  [facility].   IMPRESSION:  Severe progressive three-vessel disease.  Unfortunately,  [name] has a lot of small vessel disease and does not have good targets  for coronary artery  bypass grafting.  He does have preserved LV function  at this point with an EF of 55% with diaphragmatic and posterior  hypokinesia.   PLAN:  1. Admit to telemetry.  2. Thoracic surgery consultation with Dr. Tressie Stalker who apparently      has reviewed his pictures prior to arrival here at [facility].  Dr. Cornelius Moras      is going to see the patient later this afternoon.   We will continue his current medical program.      Jesse Sans. Daleen Squibb, MD, [facility]  Electronically Signed     TCW/MEDQ  D:  03/20/2008  T:  03/21/2008  Job:  [id number]

## 2010-12-20 NOTE — Assessment & Plan Note (Signed)
[facility]                            [place] OFFICE NOTE   CAN, LUCCI                     MRN:          [id number]  DATE:09/01/2008                            DOB:          [birth year]    [name] comes in today for followup.  He is now 5 months out for  coronary bypass grafting.  Please refer to my previous note for details.   He is having no angina.  His chest wall tenderness has resolved.  He is  having some swelling in his lower extremities.  He has had some  paresthesias in his right lower extremity.  This is where his vein  grafts were harvested.   He has lost 3 pounds.  He seems to be compliant with his meds.   CURRENT MEDICATIONS:  1. Ranitidine 150 mg p.o. b.i.d.  2. Lovastatin 40 mg nightly.  3. Metoprolol 25 b.i.d.  4. Aspirin 81 mg a day.   PHYSICAL EXAMINATION:  VITAL SIGNS:  His blood pressure is 136/86, his  pulse is 66 and regular, and his weight is 261.  HEENT:  Ruddy complexion, otherwise normal.  NECK:  Carotid upstrokes were equal bilateral without bruits.  No JVD.  Thyroid is not enlarged.  Trachea is midline.  LUNGS:  Clear to auscultation and percussion.  HEART:  Poorly appreciated PMI.  Soft S1 and S2.  Sternotomy site is  stable.  ABDOMEN:  Obese.  Good bowel sounds.  Organomegaly cannot be  appreciated.  EXTREMITIES:  Pitting edema 1+ in the right lower extremity with some  red discoloration, but no sign of cellulitis.  Pulses are present  bilaterally symmetrical.  NEUROLOGIC:  Intact.   ASSESSMENT AND PLAN:  [name] is doing well.  He seems to be  compliant.  He has lost 3 pounds.   PLAN:  1. CMP.  2. Lipid panel.  3. See me back in August 2010.     Thomas C. Daleen Squibb, MD, [facility]  Electronically Signed    TCW/MedQ  DD: 09/01/2008  DT: 09/02/2008  Job #: [id number]

## 2010-12-20 NOTE — Assessment & Plan Note (Signed)
[facility]                            [place] OFFICE NOTE   KHAALID, LEFKOWITZ                     MRN:          [id number]  DATE:06/02/2008                            DOB:          09/20/1955    Evan [name] comes in today for followup.  Other than what sounds like a little  suture drainage, now has resolved with local antiseptic, he has no  complaints.  He seems to be more compliant with his diet, though he has  gained again from 262 to 264 since September.  He still has a little  chest wall soreness.   He seems be compliant with his meds.  He is on:  1. Ranitidine 150 b.i.d.  2. Lovastatin 40 mg nightly.  3. Metoprolol 25 mg p.o. b.i.d.  4. Enteric-coated aspirin 81 mg a day.   His blood work showed glucose that was down to 101, creatinine 0.9,  potassium 4.4, total cholesterol 190, triglycerides 143, HDL 49, VLDL of  29 suggesting compliance, and LDL 112.   His exam today, his blood pressure 124/78, his pulse is 60 and regular,  his weight is 264.  HEENT:  Unchanged.  Carotid upstrokes were equal bilateral without bruits.  No JVD.  Thyroid  is not enlarged.  Trachea is midline.  LUNGS:  Clear to auscultation and percussion.  Sternotomy site is  stable.  There is no sign of any drainage or infection.  There is no  instability.  HEART:  Poorly appreciated PMI.  Normal S1 and S2.  No gallop.  ABDOMEN:  Obese with good bowel sounds.  Organomegaly cannot be  adequately assessed.  EXTREMITIES:  Trace edema.  Pulses are intact.  NEURO:  Intact.   ASSESSMENT AND PLAN:  Daquarius is doing better and seems to be a little  more compliant.  I have encouraged him to lose some weight.  We will see  him back again in 3 months.     Thomas C. Daleen Squibb, MD, [facility]  Electronically Signed    TCW/MedQ  DD: 06/02/2008  DT: 06/02/2008  Job #: [id number]

## 2010-12-20 NOTE — Op Note (Signed)
NAMEFAITH, BRANAN NO.:  [id number]   MEDICAL RECORD NO.:  [id number]          PATIENT TYPE:  INP   LOCATION:  2303                         FACILITY:  [facility]   PHYSICIAN:  Evelene Croon, M.D.     DATE OF BIRTH:  1955-10-12   DATE OF PROCEDURE:  03/23/2008  DATE OF DISCHARGE:                               OPERATIVE REPORT   PREOPERATIVE DIAGNOSIS:  Left main and severe 3-vessel coronary artery  disease.   POSTOPERATIVE DIAGNOSIS:  Left main and severe 3-vessel coronary artery  disease.   PROCEDURES:  Median sternotomy, extracorporeal circulation; coronary  artery bypass graft surgery x6 using a left internal mammary artery  graft to the left anterior descending coronary artery, with a saphenous  vein graft to the diagonal branch of the LAD, a sequential saphenous  vein graft to the intermediate, first marginal, and second marginal  branches of the left circumflex coronary artery, and a saphenous vein  graft to the right coronary artery.  Endoscopic vein harvesting from the  right leg.   SURGEON:  Evelene Croon, MD   ASSISTANT:  Rowe Clack, PA-C   ANESTHESIA:  General endotracheal.   CLINICAL HISTORY:  This patient is a 55 year old obese white male with a  known history of coronary artery disease, hypertension, hyperlipidemia,  and obstructive sleep apnea who underwent percutaneous intervention with  stenting of the right coronary artery in 1998.  Since then, he has had  some symptoms of chronic stable angina with normal left ventricular  function.  He now presents with a 9-month history of progressive fatigue  and new onset of substernal chest pain radiating into his left arm,  which awoken him from sleep on March 18, 2008.  He was brought to  [facility] where he had negative cardiac enzymes.  He  ruled out for myocardial infarction based on enzymes.  He underwent  cardiac catheterization, which showed severe 3-vessel disease.  There  was about 30-50% distal left main stenosis.  There was serial lesions in  the proximal left circumflex with 80-95% stenosis.  There is 100%  proximal occlusion of the first marginal and 80-90% stenosis of the  second marginal branch.  There is 90% proximal stenosis of the large  intermediate or high diagonal branch.  There is also about 80-90%  proximal stenosis of the second diagonal branch.  There is at least 90%  stenosis in several places in the mid and distal LAD.  The LAD wrapped  around the apex of the heart and was diffusely diseased and relatively  small.  The patient had patent stents in the proximal right coronary  artery with 50% stenosis at the end of the stent.  There is 60-70%  stenosis of the mid right coronary artery.  Left ventricular function  was fairly normal with an ejection fraction of greater than 50%.  After  review of the catheterization and examination of the patient, it was  felt that coronary artery bypass graft surgery is the best treatment to  prevent further ischemia and infarction.  I discussed the operative  procedure with the  patient including alternatives, benefits, and risks  including but not limited to bleeding, blood transfusion, infection,  stroke, myocardial infarction, graft failure, and death.  He understood  and agreed to proceed.   OPERATIVE PROCEDURE:  The patient was taken to the operating room and  placed on the table in supine position.  After induction of general  endotracheal anesthesia, a Foley catheter was placed in the bladder  using sterile technique.  Then, the chest, abdomen, and both lower  extremities were prepped and draped in the usual sterile manner.  The  chest was entered through a median sternotomy incision.  The pericardium  opened in the midline.  Examination of the heart showed good ventricular  contractility.  The patient had a large amount of epicardial fat.  There  was a large amount of mediastinal fat.  The ascending  aorta was  relatively small and short.  He had no palpable plaques in it.   Then, the left internal mammary artery was harvested from the chest wall  as pedicle graft.  This was a medium caliber vessel with excellent blood  flow through it.  At the same time, a segment of greater saphenous vein  was harvested from the right leg using endoscopic vein harvest  technique.  This was a medium size and good quality.   Then, the patient was heparinized, and when an adequate activated  clotting time was achieved, the distal ascending aorta was cannulated  using a 22-French aortic cannula for arterial inflow.  Venous outflow  was achieved using a 2-stage venous cannula through right atrial  appendage.  An antegrade cardioplegia and vent cannula was inserted in  aortic root.   The patient placed on cardiopulmonary bypass and distal coronaries  identified.  He had small diffusely diseased vessels.  Then, the aorta  was crossclamped and 500 mL of cold blood antegrade cardioplegia was  administered in the aortic root with quick arrest of the heart.  Systemic hypothermia to 28 degrees centigrade and topical hypothermia  with iced saline was used.  A temperature probe was placed in the septum  insulating pad in the pericardium.   The first distal anastomosis was then performed to the right coronary  artery.  The internal diameter was about 2 mm.  The conduit used was a  segment of greater saphenous vein and the anastomosis formed in end-to-  side manner using a continuous 7-0 Prolene suture.  The flow was noted  through the graft and was excellent.   The second distal anastomosis was performed to the intermediate  coronary.  The internal diameter of this vessel was about 1.6 mm.  The  conduit used was a second segment of greater saphenous vein and the  anastomosis formed in a sequential side-to-side manner using a  continuous 7-0 Prolene suture.  Flow was noted through the graft and was   excellent.   The third distal anastomosis was then performed to the first marginal  branch.  The internal diameter of the vessel was about 1.25 mm.  The  conduit used was the same segment of greater saphenous vein and the  anastomosis formed in a sequential side-to-side manner using a  continuous 8-0 Prolene suture.  Flow was noted through the graft and was  excellent.   The fourth distal anastomosis was then performed to the second marginal  branch.  The internal diameter was also about 1.25 mm.  The conduit used  was the same segment of greater saphenous vein and the  anastomosis  formed in a sequential end-to-side manner using a continuous 8-0 Prolene  suture.  Flow was again measured through the graft and was excellent.  Then, another dose of cardioplegia was given down the vein grafts and in  the aortic root.   Then, the fifth distal anastomosis was performed to the diagonal branch.  The internal diameter of this vessel was about 1.5 mm.  The conduit used  was a third segment of greater saphenous vein and the anastomosis formed  in an end-to-side manner using a continuous 7-0 Prolene suture.  Flow  was noted through the graft and was excellent.   The sixth distal anastomosis was performed to the distal LAD.  The  internal diameter and the area of anastomosis was about 1.5 mm.  An 1-mm  probe was passed distally to the apex, but a 1.5-mm probe would not pass  beyond the arteriotomy site.  The conduit used was the left internal  mammary graft.  This was brought through an opening in the left  pericardium anterior to the phrenic nerve.  This was anastomosed to the  LAD in end-to-side manner using a continuous 8-0 Prolene suture.  Flow  was noted through the graft and was excellent.  Then, another dose of  cardioplegia was given.   With the crossclamp in place, 3 proximal vein graft anastomoses were  performed.  The aortic root in end-to-side manner using a continuous 6-0  Prolene  suture.  Then, the clamp was removed from the mammary pedicle.  There was rapid warming of the ventricular septum and return of  spontaneous ventricular fibrillation.  The crossclamp was removed with  time of 114 minutes and there was spontaneous return of sinus rhythm.  The proximal and distal anastomoses appeared hemostatic and lie the  grafts satisfactory.  Graft markers placed around the proximal  anastomoses.  Two temporary right ventricular and right atrial pacing  wires were placed above the skin.   When the patient was rewarmed to 37 degrees Centigrade, he was weaned  from cardiopulmonary bypass on no inotropic agents.  Total bypass time  was 132 minutes.  Cardiac function appeared excellent with a cardiac  output of 4.5 L per minute.  Then, protamine was given and the venous  and aortic cannulas were removed without difficulty.  Hemostasis was  achieved.  Three chest tubes were placed with tube in the posterior  pericardium, one in the left pleural space, and one in the anterior  mediastinum.  The pericardium could not be reapproximated over the  heart.  The sternum was closed with double #6 stainless steel wires.  Fascia was closed with continuous #1 Vicryl suture.  Subcutaneous tissue  was closed with continuous 2-0 Vicryl and the skin with 3-0 Vicryl  subcuticular closure.  The lower extremity vein harvest site was closed  in layers in a similar manner.  The sponge, needle, and instrument  counts were correct according to the scrub nurse.  Dry sterile dressings  were applied over the incisions around the chest tubes, which were Pleur-  Evac suction.  The patient remained hemodynamically stable and was  transferred to the SICU in guarded but stable condition.      Evelene Croon, M.D.  Electronically Signed     BB/MEDQ  D:  03/23/2008  T:  03/24/2008  Job:  [id number]   cc:   Thomas C. Wall, MD, [facility]

## 2010-12-23 NOTE — Discharge Summary (Signed)
[place]  Evan [name]:    Evan [name], Evan [name] Visit Number: [id number] MRN: [id number]          Service Type: Attending:  Everardo Beals. Juanda Chance, M.D. [facility] Dictated by:   Rozell Searing, P.A. Adm. Date:  07/18/01 Disc. Date: 07/19/01   CC:         Dr. Judie Petit. Lacie Scotts of [employer]   Referring Physician Discharge [place]  PROCEDURES:  Coronary angiogram/stent RCA and PTCA, CFX - July 18, 2001.  REASON FOR ADMISSION:  Evan [name] is a 55 year old male with known coronary artery disease notable for remote MI, multiple prior percutaneous interventions, with most recent a stent of the proximal RCA February 2002 following direct admission from our office.  He presents with prolonged chest discomfort described as his worst episode since his previous intervention. The Evan [name] is followed by Dr. Valera Castle but states that he has not had any follow-up since his previous intervention.  PAST MEDICAL HISTORY: 1. Coronary artery disease.    a. MI/PTCA OM in 1990.    b. PTCA CFX/OM in 1993 [facility]).    c. Stent LAD/PTCA RCA (2) in 1998.    d. Stent proximal RCA in February 2002 - EF 51%. 2. Scarlet fever (age 53). 3. Hypertension. 4. Dislipidemia. 5. Obesity.  LABORATORY DATA:  Cardiac enzymes:  CPK/MB and troponin I negative x 3. Normal CBC.  Normal metabolic profile.  Lipid panel pending.  Admission CXR:  NAD.  HOSPITAL COURSE:  The Evan [name] ruled out for MI with negative serial cardiac enzymes.  Recommendation was to proceed with diagnostic coronary angiogram, given his similar but not as intense presentation as compared to February 2002.  He was not placed on intravenous nitroglycerin or heparin.  Same-day coronary angiogram performed by Dr. Chales Abrahams (see report for full details) revealed CAD progression with 80% ostial CFX and 50/70% proximal RCA and normal LAD.  The previous RCA stent site appeared widely patent.  Dr. Chales Abrahams proceeded with successful PTCA  dilatation of the 80% ostial CFX lesion to less than 20% residual stenosis; stenting of the 50/70% proximal RCA lesions to 0% residual stenosis with no noted complications.  Cutting balloon angioplasty was utilized.  Postoperative Integrilin infused x 18 hours.  Dr. Chales Abrahams recommended Plavix x 1 month.  Postoperative course benign, but notable for development of an asymptomatic 4.2 second pause early morning of discharge.  Evan [name] was hemodynamically stable.  Final recommendations at time of discharge, following review with Dr. Myrtis Ser, were to continue Evan [name] on the previous dose of Toprol-XL (25 mg) which he had been tolerating prior to admission.  We will proceed with an outpatient 24-hour Holter monitor for assessment of any recurrent pauses.  Evan [name] mentioned to the nurse that these are not new and that he has "sleep apnea." However, when I questioned him, he denied undergoing any formal study.  Also of note, Evan [name] is completely disabled and was on [place] but is now only on Medicare.  We are arranging for him to get some financial assistance regarding his medications.  He also has been referred to the [facility] in [place] for assistance.  Given these financial constraints, we will eliminate his Imdur.  MEDICATIONS AT DISCHARGE: 1. Coated aspirin 81 mg q.d. 2. Lipitor 10 mg q.d. 3. Plavix 75 mg q.d. (x 1 month). 4. Toprol-XL 25 mg q.d. 5. Nitrostat as directed.  INSTRUCTIONS:  ACTIVITY:  No heavy lifting/driving x 2 days.  WOUND CARE:  Call the office if there  is any bleeding/swelling of the groin.  FOLLOW-UP:  Evan [name] is scheduled to follow up with Dr. Elijah Birk Wall/Bill Yearns P.A.C. on August 02, 2001 at 2:30 p.m.  He is also referred to the [facility].  DISCHARGE DIAGNOSES: 1. Coronary artery disease progression.    a. Negative serial cardiac enzymes.    b. Percutaneous transluminal coronary angioplasty 80%  circumflex/stent       50/70% proximal right coronary artery on July 18, 2001; no       restenosis at prior stent right coronary artery site.    c. Stent proximal right coronary artery in February 2002. 2. Asymptomatic sinus pause. 3. Disability. 4. Dislipidemia. 5. Hypertension. 6. Obesity. Dictated by:   Rozell Searing, P.A. Attending:  Everardo Beals Juanda Chance, M.D. [facility] DD:  07/19/01 TD:  07/19/01 Job: [id number]

## 2010-12-23 NOTE — Assessment & Plan Note (Signed)
[name]                              [name] OFFICE NOTE   Evan [name], Evan [name]                     MRN:          [id number]  DATE:06/19/2006                            DOB:          05-Jun-1956    [name] returns today for close followup of his blood pressure.  See my note  from  May 25, 2006.   He never took both the metoprolol together and the Norvasc.  He said he went  to see his primary care doctor and she said his blood pressure might get too  low.  Apparently he was better that day in her office.   He had a severe episode of losing his vision and developing vertigo.  He  went down to [facility].  He had a CT scan which was  apparently negative.  He was placed on meclizine p.r.n.  He is not taking it  and is really not having any vertigo.   His medications are:  1. Ranitidine 150 b.i.d.  2. He is supposed to be on metoprolol 50 mg in the morning, 25 in the      evening, which he is not taking.  3. Lovastatin 40 mg daily.  4. Aspirin 81 mg b.i.d.  5. Amlodipine 5 mg daily.  6. Meclizine 25 mg p.o. b.i.d., p.r.n.  7. Amoxicillin 500 t.i.d.   His blood pressure today with a large cuff is 142/80 in the left arm, 138/80  in the right arm. His heart rate is 79.  We checked orthostatics and he  actually is not orthostatic, raising his blood pressure to 134/82, to  147/88.  Carotid upstrokes are equal bilaterally without bruits.  No JVD.  Thyroid is  not enlarged.  LUNGS:  Are clear.  HEART:  Reveals a regular rate and rhythm.  ABDOMEN:  Protuberant with good bowel sounds.  EXTREMITIES:  With no edema.   I have had a long talk with Evan [name] today.  I have recommended meclizine  p.r.n. as the emergency room has.  I strongly recommended he start back on  his metoprolol 50 in the morning, 25 in the evening and continue with his  Norvasc.  I would really like his blood pressure  around 110 to 120 if possible.  I  answered all of his questions.  We will  plan on seeing him back again in 6 months.     Thomas C. Daleen Squibb, MD, [facility]  Electronically Signed    TCW/MedQ  DD: 06/19/2006  DT: 06/19/2006  Job #: [id number]   cc:   Launa Flight, MD, [facility]

## 2010-12-23 NOTE — Consult Note (Signed)
[place]. [facility]  Patient:    Evan [name], Evan [name]                       MRN: [id number] Adm. Date:  08/14/00 Attending:  Jesse Sans. Daleen Squibb, M.D. [facility] Dictator:   Joellyn Rued, P.A.C. CC:         Dr. __________  Jesse Sans. Wall, M.D. [facility] in [facility]   Consultation Report  DATE OF BIRTH:  23-Feb-1956  HISTORY OF PRESENT ILLNESS:  Evan [name] is a 55 year old white male who presents to [facility] this evening complaining of bilateral arm discomfort, chest discomfort, abdominal discomfort, and leg discomfort. He states that he has been doing really well. He has not had any specific complaints. However, last night, he was awakened at approximately 2 a.m. with abdominal cramping. He was also nauseated and vomited. He then noticed he was having some bilateral arm aching which extended into this posterior neck. He thinks that he was short of breath and diaphoretic. He took some aspirin and Coke with improvement of his symptoms. His symptoms lasted less than 45 minutes, and he returned to sleep. He woke up at his usual time around 6 a.m., and he felt fine. Around 12:15, he developed right-sided, abdominal cramping, and he felt that his right leg was "on fire." He did not have any associated nausea or vomiting, shortness of breath, or diaphoresis, and his symptoms lasted less than 20 minutes. He cannot describe alleviating or aggravating symptoms. Around 2 oclock, he noticed both arms were aching. He took two sublingual nitroglycerins without improvement. Throughout the afternoon, the aching would wax and wane between a 6 and 3 on a scale of 0 to 10. He has continued to hurt throughout the evening. The discomfort finally eased off approximately 30 to 40 minutes ago, and he states that it is now 0. However, he notices some soreness. He denies any injuries, falls; he feels the symptoms are different from his prior episodes of cardiac events. After speaking with  him a second time, then he described a stinging chest pain that lasts just a few seconds.  ALLERGIES:  He is not allergic to anything.  CURRENT MEDICATIONS:  He states that his medications include Lipitor, Toprol, enteric-coated aspirin, sublingual nitroglycerin, and Imdur. However, he does not know the dosages, and he states that he not taking his medications on a regular basis because he cannot afford them.  MEDICAL HISTORY:  His history is notable for hypertension, obesity, hyperlipidemia, myocardial infarction in 1990 with an angioplasty to the OM in 1993 at [facility], stent to the LAD and angioplasties to the RCA x 2 in September of 1998, ejection fraction of 45%. February of 1999, a catheterization showed a 40% LAD at the prior stent site, 70% mid LAD, 90% distal LAD, total OM, 60% proximal RCA, 70% distal RCA, less than 20% distal RCA at the prior angioplasty site, 95% AM inferior akinesia, EF 55%, small-vessel disease, and recommended continued medical treatment. His last ER visit was in September of 2000. His symptoms at that time were stinging chest discomfort, dizziness, blurry vision, nausea. He was released from the emergency room at that time for outpatient follow-up (labs and EKGs unremarkable). He was arranged to have a stress Cardiolite on May 11, 1999, at 9:30. Social services saw him prior to discharge to assist him in obtaining medications and establishing medical care through [facility]. However, the patient has not followed up with obtaining medications or  through [facility]. His office records are unavailable at this time.  LABORATORY DATA:  In the emergency room, his EKG showed normal sinus rhythm, possible evidence of an old inferior myocardial infarction, nonspecific ST-T wave changes. There was not any change from September of 2000.  His H&H are 14.8 and 43.7, WBCs 12.9, platelets 289, neutrophils were within normal limits at 61. Sodium was 138, potassium  3.9, BUN 21, creatinine 0.8, glucose 106. CK was 222, with an MB of 2.5, troponin less than 0.01.  PHYSICAL EXAMINATION:  GENERAL:  Well-nourished, well-developed, pleasant, white male in no apparent distress.  VITAL SIGNS:  Temperature is 98.1, blood pressure 141/74, pulse 98, respirations 18, pulse oximetry of 97% on room air.  HEENT:  Unremarkable.  NECK:  Supple without JVD or bruits.  LUNGS:  Clear to auscultation.  HEART:  Regular rate and rhythm. No murmurs, rubs, clicks, or gallops.  ABDOMEN:  Bowel sounds are present. Abdominal bruits were appreciated. He did complain of left lower quadrant tenderness. However, the second time around when he examined him, he complained of right lower quadrant tenderness.  EXTREMITIES:  Revealed 4+ femorals without bruits. DP and PT. No edema.  DISPOSITION:  After reviewing with Maisie Fus C. Wall, M.D., it was difficult to tell what type of actual problems he was having. We will continue to treat him for angina and try to see his medical needs. He will follow up with Maisie Fus C. Wall, M.D. in 6 months or as needed. He was given new prescriptions for his medications. He was asked to call the office if he had any further problems. DD:  08/14/00 TD:  08/14/00 Job: [id number]

## 2010-12-23 NOTE — Discharge Summary (Signed)
[place]. [facility]  Patient:    Evan [name], Evan [name]                     MRN: [id number] Adm. Date:  [id number] Disc. Date: 09/22/00 Attending:  Nathen May Dictator:   Dian Queen, P.A.-C. [facility] CC:         Charlean Sanfilippo, M.D. - [place], [place]   Discharge Summary  HISTORY OF PRESENT ILLNESS:  Mr. Kamara is a 55 year old white male who walked into our office on the day of admission complaining of chest pain.  He has known coronary artery disease with prior intervention in 1990, and follow-up intervention in September 1998.  His last cardiac catheterization in February 1999, and had significant multivessel disease, many lesions being quite distal.  At that time he was treated medically.  Both of his stent sites looked open.  His ejection fraction then was 55%.  He is admitted now with unstable angina.  ACCESSORY CLINICAL FINDINGS:  Electrolytes and renal function were totally within normal limits.  Troponins and CPKs were low.  Hemoglobin 13.8 with a hematocrit of 40.8, white count 10,500.  HOSPITAL COURSE:  The patient was transferred via ambulance from the office to the cardiac catheterization laboratory, where Dr. Everardo Beals. Juanda Chance proceeded to perform a coronary angiography and found a 90% proximal RCA lesion that was stented to 0%, without complications.   His LAD stent looked open, but it did have 80% lesions in the distal LAD, as well as a 40% lesion in a diagonal.  He has some inferior akinesis, but the ejection fraction overall was well-preserved at 51%.  He tolerated the procedure well and by the following morning was ready for discharge.  DISCHARGE DIAGNOSES: 1. Coronary artery disease:    a. History of an acute myocardial infarction in 1990, treated with       angioplasty of the obtuse marginal-I.    b. Left anterior descending coronary artery stent in 1998.    c. Last cardiac catheterization in February 1999, treated medically.  d. Unstable angina this admission, after which he was catheterized       and found to have a 90% proximal right coronary artery lesion that       was stented successfully to 0%, without complications. 2. Treated hyperlipoproteinemia. 3. Controlled hypertension. 4. Exogenous obesity.  DISCHARGE MEDICATIONS: 1. The patient will continue on his Toprol 50 mg q.d. 2. Imdur 30 mg q.d. 3. Lipitor 10 mg q.d. 4. Aspirin one q.d. 5. Plavix 75 mg q.d. for one month.  He has already used all of his Medicaid stickers for this month, and will try to get him some Plavix from another source.  FOLLOWUP:  He will follow up in two weeks. DD:  09/22/00 TD:  09/22/00 Job: [id number]

## 2010-12-23 NOTE — Discharge Summary (Signed)
NAMEVUONG, MUSA NO.:  [id number]   MEDICAL RECORD NO.:  [id number]          PATIENT TYPE:  INP   LOCATION:  2032                         FACILITY:  [facility]   PHYSICIAN:  Evelene Croon, M.D.     DATE OF BIRTH:  [birth year]   DATE OF ADMISSION:  03/20/2008  DATE OF DISCHARGE:  03/27/2008                               DISCHARGE SUMMARY   HISTORY:  The patient is a 55 year old obese white male with known  history of coronary artery disease, hypertension, hyperlipidemia,  gastroesophageal reflux disease, and obstructive sleep apnea.  The  patient's cardiac history dated back to 1998 at which time he presented  with angina pectoris and underwent percutaneous coronary intervention  and stenting of the right coronary artery.  Since then, he apparently  has had some symptoms of chronic stable angina.  He had been followed  with known stable normal left ventricular function.  The patient  described a history of approximately 2 months of progressive fatigue.  On the morning of March 18, 2008, he was awoke from sleep with severe  substernal chest pain that radiated to his left arm.  The pain was much  more severe than any previous episodes and persisted prompting him to  call EMS where he was brought to [facility] and  noted to be significantly hypertensive.  A baseline electrocardiogram  revealed normal sinus rhythm with slight ST elevation in the inferior  leads.  Cardiac enzymes were normal.  The chest pain resolved with  heparin and nitrates.  He ruled out for acute myocardial infarction  based on serial cardiac enzymes.  He subsequently underwent a cardiac  catheterization by Dr. Tonny Bollman.  Findings at that time  demonstrated severe 3-vessel coronary artery disease with mild left  ventricular dysfunction.  He was felt to require transfer to [facility] for surgical revascularization.   PAST MEDICAL HISTORY:  1. Coronary artery  disease.  2. Hypertension.  3. Hyperlipidemia.  4. Gastroesophageal reflux.  5. Obstructive sleep apnea.  6. Remote history of pneumonia.  7. Childhood history of scarlet fever.   PAST SURGICAL HISTORY:  None.   MEDICATIONS PRIOR TO ADMISSION:  1. Aspirin 81 mg daily.  2. Lovastatin 40 mg daily.  3. Toprol-XL 25 mg daily.  4. Sublingual nitroglycerin p.r.n.   ALLERGIES:  PENICILLIN and SULFA.   FAMILY HISTORY/SOCIAL HISTORY/REVIEW OF SYSTEMS/PHYSICAL EXAMINATION:  Please see the history and physical done at the time of admission.   HOSPITAL COURSE:  The patient was transferred to [facility].  He was seen in  surgical consultation and felt to be a candidate for surgical  revascularization.  The procedure was scheduled and on March 23, 2008  he underwent the following procedure.  Coronary artery bypass grafting x6.  The following grafts were placed.  1. Left internal mammary artery to the LAD.  2. Sequential saphenous vein graft to the intermediate and obtuse      marginal #1 and obtuse marginal #2.  3. Saphenous vein graft to the right coronary artery.  4. Saphenous vein graft to the diagonal.  Findings included severe diffuse disease and small distal vessels.  The  patient tolerated the procedure well and was taken to the surgical  intensive care unit in stable condition.   POSTOPERATIVE HOSPITAL COURSE:  Overall, the patient progressed nicely.  He was weaned from the ventilator without difficulty.  He awoke  neurologically intact.  He remained hemodynamically stable.  All routine  lines, monitors, and drainage devices were discontinued in the standard  fashion.  He did have a mild postoperative anemia.  This stabilized.  Most recent hemoglobin and hematocrit prior to discharge was 11 and 34  respectively.  He tolerated a rapid advancement activity using standard  protocols.  He had no significant cardiac dysrhythmias.  Oxygen was  weaned and he maintained good saturations on  room air.  He did use his  BiPAP at night for his chronic sleep apnea.  His overall status was  deemed to be quite acceptable for discharge on April 27, 2008.   MEDICATIONS ON DISCHARGE:  1. Crestor 20 mg daily.  2. Zantac 150 mg twice daily.  3. Enteric-coated aspirin 325 mg daily.  4. Lopressor 25 mg twice daily.  5. Oxycodone 5 mg 1-2 every 4-6 hours as needed for pain.  6. Lasix 40 mg daily for 7 days.  7. K-Dur 20 mEq daily for 7 days.   INSTRUCTIONS:  The patient received written instructions in regard to  medications, activity, diet, wound care, and followup.  Followup  included Dr. Daleen Squibb in 2 weeks post discharge and Dr. Laneta Simmers on April 20, 2008 at 1 p.m.   FINAL DIAGNOSIS:  Severe multivessel coronary artery disease, now status  post surgical revascularization.   OTHER DIAGNOSES:  1. Hypertension.  2. Hyperlipidemia.  3. Obesity.  4. Esophageal reflux.  5. Obstructive sleep apnea.      Rowe Clack, P.A.-C.      Evelene Croon, M.D.  Electronically Signed    WEG/MEDQ  D:  06/23/2008  T:  06/24/2008  Job:  [id number]   cc:   Thomas C. Daleen Squibb, MD, [facility]  Dr. Weldon Picking

## 2010-12-23 NOTE — Discharge Summary (Signed)
[place]. [facility]  Patient:    Evan [name], Evan [name]                     MRN: [id number] Adm. Date:  [id number] Disc. Date: [id number] Attending:  Nathen May Dictator:   Annett Fabian, P.A.-C. CC:         Don Perking, M.D. [phone]  Jesse Sans. Wall, M.D. [facility]   Discharge Summary  DISCHARGE DIAGNOSES: 1. Coronary artery disease, status post percutaneous coronary intervention    stenting on September 21, 2000. 2. Hypertension. 3. Hyperlipidemia. 4. Obesity.  HOSPITAL COURSE:  The patient presented to the emergency room complaining of "smothering sensation."  The patient had been discharged prior in the day from [facility] after catheterization PCI.  He was seen and admitted to Dr. Berton Mount.  Diagnosis rule out pulmonary embolism.  Spiral and CT of the chest revealed no evidence of pulmonary emboli, some coronary artery calcification, no other acute pulmonary findings.  There was also some questionable esophageal wall thickening in the distal region, possibly suggesting esophagitis was the recommended clinical correlation.  Spiral CT of the lower extremities revealed no evidence for deep venous thrombosis.  The following day, the patient reported some mild dyspnea when he woke up but otherwise much improved.  On September 24, 2000, the patient was seen by Dr. Juanito Doom.  He felt the patient was stable and pain free.  DISCHARGE MEDICATIONS: 1. Plavix 75 mg q.d. 2. Toprol XL 50 mg q.d. 3. Lipitor 10 mg q.p.m. 4. Sublingual nitroglycerin p.r.n. 5. Imdur 30 mg q.d.  DISCHARGE INSTRUCTIONS:  The patient was advised to increase his activity and increase his level of walking.  The patient is to follow a fat-modified diet.  The patient is to follow up with Dian Queen, P.A.-C. in the office on March 1 at 8:30 in the morning.  LABORATORY VALUES:  CK 278, CK-MB 2.0.  Sodium 135, potassium 3.8, chloride 106, CO2 24, BUN 19, creatinine 0.8,  glucose 108, troponin I 0.01, d-dimer 0.36, white count 12.7, hemoglobin 13.6, hematocrit 40.4, platelets 307. DD:  09/24/00 TD:  09/24/00 Job: [id number]

## 2010-12-23 NOTE — Cardiovascular Report (Signed)
[place]. [facility]  Patient:    Evan [name], Evan [name] Visit Number: [id number] MRN: [id number]          Service Type: MED Location: [phone] Attending Physician:  Lenoria Farrier Dictated by:   Veneda Melter, M.D. [facility] Proc. Date: 07/18/01 Admit Date:  07/18/2001   CC:         Jonelle Sidle, M.D. [facility]  Jesse Sans. Wall, M.D. [facility]  Bruce R. Juanda Chance, M.D. [facility]   Cardiac Catheterization  PROCEDURES PERFORMED: 1. Selective coronary angiography. 2. Percutaneous transluminal coronary angioplasty of the left circumflex    artery. 3. Percutaneous transluminal coronary angioplasty and stenting of the proximal    right coronary artery.  DIAGNOSES: 1. Two-vessel coronary artery disease. 2. Unstable angina.  HISTORY:  Evan [name] is a 55 year old white male with multiple cardiac risk factors and known history of coronary artery disease who has previously undergone percutaneous intervention who presents with substernal chest and left arm discomfort.  The patient underwent cardiac catheterization by Dr. Diona Browner showing severe two-vessel coronary artery disease with a high-grade narrowing of 80% at the origin of a major marginal branch of the left circumflex artery as well as severe narrowing of 70% in the proximal segment of the right coronary artery prior to a previously placed stent in the mid section of the vessel.  He presents now for percutaneous intervention.  DESCRIPTION OF PROCEDURE:  Informed consent had been obtained and the existing 6-French sheath was exchanged over wire for a 7-French sheath.  The patient was given heparin and Integrilin on a weight-adjusted basis to maintain an ACT of approximately 225 seconds.  A 7-French EBU 3.75 guide catheter was then used to engage the left coronary artery.  A 0.014-inch Extra Sport wire was advanced into the distal section of the first marginal branch.  A 2.5- x 10-mm cutting balloon was introduced.   Two inflations were performed at six atmospheres for 30 seconds.  Repeat angiography showed improvement in vessel lumen; however, there was still residual disease of 40-50% due to undersizing of the balloon.  A 3.0- x 10-mm cutting balloon was introduced.  Two inflations were performed at six atmospheres for 30 seconds and a single inflation of 10 atmospheres for 60 seconds.  Repeat angiography after the administration of intracoronary nitroglycerin showed an excellent result. There was less than 20% residual narrowing, no evidence of vessel damage, and TIMI-3 flow through the left circumflex system.  The patient did have chest discomfort with balloon inflations reminiscent of pain on presentation.  This was relieved with balloon deflations and supplemental nitroglycerin. Attention was then turned to the right coronary artery and a 7-French Launcher right #4 was then used to engage the right coronary artery.  This had sideholes and as it engaged with the 6-French JR4 catheter, it showed damping of waveform.  Selective guide shots were obtained and the Extra Sport wire was advanced to the distal RCA.  The 3.0- x 10-mm cutting balloon was introduced.  Two inflations were performed at six atmospheres for 30 and 20 seconds and a single inflation at eight atmospheres for 60 seconds.  Repeat angiography showed moderate residual disease of 40-50%.  After several minutes, however, significant vessel recoil was noted of at least 50-60%.  It was felt that this would not be a stable lesion.  We then elected to place a 3.5- x 18-mm AVE S7 stent in the proximal segment of the right coronary artery.  This extended up to  and overlapped with the previously placed stent in the mid section of the vessel.  This was deployed at 16 atmospheres for 30 seconds.  Repeat angiography then showed an excellent result with no residual stenosis, no evidence of vessel damage, and TIMI-3 flow through the right coronary  artery.  The guide catheter was then removed and the sheath was secured in position.  The patient tolerated the procedure well and was transferred to the floor in stable condition.  FINAL RESULT: 1. Successful percutaneous transluminal coronary angioplasty of the ostium of    the first marginal branch with reduction in an 80% narrowing to less than    20% with a 3.0-mm cutting balloon. 2. Successful percutaneous transluminal coronary angioplasty and stenting of    the proximal right coronary artery with reduction of sequential 50% and 70%    narrowings to 0% with placement of 3.5- x 18-mm AVE S7. Dictated by:   Veneda Melter, M.D. [facility] Attending Physician:  Lenoria Farrier DD:  07/18/01 TD:  07/18/01 Job: [id number]

## 2010-12-23 NOTE — Op Note (Signed)
[place]. [facility]  Patient:    Evan [name], Evan [name] Visit Number: [id number] MRN: [id number]          Service Type: MED Location: [phone] Attending Physician:  Lorre Nick Dictated by:   Everardo Beals Juanda Chance, M.D. [facility] Proc. Date: 07/18/01 Admit Date:  07/18/2001                             Operative Report  PRIMARY CARE PHYSICIAN: Formerly Dr. Lacie Scotts.  CARDIOLOGIST:  Jesse Sans. Wall, M.D.  CHIEF COMPLAINT: Chest pain.  HISTORY OF PRESENT ILLNESS: Evan [name] is 55 years old, and had a previous myocardial infarction, treated at [facility] with starting of the LAD, and he had a previous PTCA of his right coronary artery.  In February of this year he developed chest pain and was seen in the office, and transferred to the hospital and found to have 90% stenosis in the proximal right coronary artery, which was treated with stenting.  He had residual distal disease in the posterior descending branch of the right coronary artery of 95%.  He had less than 20% narrowing at the stent site in the proximal LAD, but he had tandem 80% distal stenosis in the LAD, and he had total occlusion of the marginal branch of the circumflex artery.  His left ventriculogram showed inferior wall hypokinesis with an ejection fraction of 51%.  He says he has been having chest pain which he describes as a left-sided ache off and on for two to three months.  The pain is nonexertional and seems to come on without any precipitating causes.  He has no associated shortness of breath, diaphoresis, or nausea with this.  Last night at 4 a.m. he awoke with similar left-sided chest pain, took a nitroglycerin and the pain went away within a few minutes.  He had no associated symptoms of shortness of breath, nausea, or diaphoresis with this episode as well.  He called EMS and came to the emergency room by ambulance.  PAST MEDICAL HISTORY:  1. History of scarlet  fever.  2. Hypertension.  3. Hyperlipidemia.  4. Excess weight.  PAST SURGICAL HISTORY: None.  CURRENT MEDICATIONS:  1. Lipitor 20 mg q.d.  2. Imdur 30 mg q.d.  3. Toprol XL, questionable 25 mg q.d.  4. Aspirin 325 mg q.d.  5. Nitroglycerin p.r.n.  SOCIAL HISTORY: He is single and has no children.  He does not smoke and does not drink.  FAMILY HISTORY: His mother died at 31 of an MI.  His father died at 65n of rheumatic fever.  He has a brother who died at 64 of an MI.  REVIEW OF SYSTEMS: Please see note of Gene Serpe, P.A.  PHYSICAL EXAMINATION:  VITAL SIGNS: Blood pressure 149/90, pulse 88 and regular, respirations 16.  HEENT: PERRL.  NECK: Carotid pulses full.  No bruits.  No venous distention.  CHEST: Clear.  ABDOMEN: Soft, without organomegaly.  EXTREMITIES: No edema.  Pulses equal and symmetric.  HEART: Cardiac rhythm regular.  First and second heart sounds normal.  No murmurs or gallops.  SKIN: Normal without abnormal lesions.  NEUROLOGIC: He was alert and oriented x 3.  LABORATORY DATA: EKG showed Q waves in 2, 3, and aVF, but no acute changes.  BUN and creatinine were normal.  IMPRESSION:  1. Chest pain, possibly due to myocardial ischemia versus musculoskeletal.  2. Coronary artery disease, status post prior  stenting of the left anterior     descending, prior percutaneous transluminal coronary angioplasty of the     right coronary artery, and prior stenting of the right coronary artery in     February 2002.  3. Good left ventricular function with ejection fraction of 51% at last     catheterization.  4. Hypertension.  5. Hyperlipidemia.  6. Obesity.  7. Disabled, on Medicare.  PLAN: Will plan to admit the patient for observation and serial enzymes and EKGs.  Will treat the patient with heparin.  In view of the patients previous atypical presentation with significant disease, and the patients concern, I think the best approach would be to  evaluate with coronary angiography.  Will plan to arrange for this later today. Dictated by:   Everardo Beals Juanda Chance, M.D. [facility] Attending Physician:  Lorre Nick DD:  07/18/01 TD:  07/18/01 Job: [id number]

## 2010-12-23 NOTE — Assessment & Plan Note (Signed)
[facility]                              [facility] OFFICE NOTE   EMMET, MESSER                     MRN:          [id number]  DATE:05/25/2006                            DOB:          1956-01-18    SUBJECTIVE:  Evan [name] comes in today for followup of his coronary  artery disease.  I last saw him in the office March 29, 2005.  At that time  we began him on Crestor which he is no longer on.  He is on lovastatin 40 mg  a day.   PROBLEM LIST:  1. Coronary artery disease.  He has early morning angina which resolves      once he is up and about.  He does not have it through the day.  He has      had multiple coronary interventions in the past as outlined with      previous notes.  He has good left ventricular function with ejection      fraction of 50-55%.  2. Hyperlipidemia.  We do not have recent labs.  I assume this is being      followed by Dr. Dietrich Pates.  He has been changed to lovastatin 40 mg a      day.  He could not tolerate Lipitor because of low back pain possibly.  3. Hypertension.  His blood pressure is always high.  Some of this is due      to noncomplaince because of financial issues.  A lot of it is being      overweight and dietary indiscretion.  4. Obesity.  He says he watches what he eats but gets very little      activity.  He has been unsuccessful losing weight for a number of      years.  His weight today is 276 from 279 no last visit.  5. Noncompliance secondary to financial issues.  He now has Medicare Part      D.  He pays very little if anything for generics.   CURRENT MEDICATIONS:  1. Aspirin 81 mg b.i.d.  2. Lovastatin 40 mg a day.  3. Metoprolol 50 mg in the morning, 25 mg in the evening.  4. Ranitidine 150 mg b.i.d.   PHYSICAL EXAMINATION:  VITAL SIGNS:  His blood pressures initially 148/81 in  the right arm, 177/98 in the left arm with a pulse of 74 and regular.  I  repeated it with a large cuff with  his left arm being 162/92, right being  142/90.  GENERAL:  He is in no acute distress.  HEENT:  Ruddy complexion.  PERRLA.  Extraocular movements intact.  Sclerae  clear.  Facial symmetry is normal.  Dentition is satisfactory.  NECK:  Carotid upstrokes are equal bilaterally without bruits.  There is no  JVD.  Thyroid is not enlarged.  Trachea midline.  LUNGS:  Clear to auscultation with decreased excursion.  HEART:  Soft S1 and S2 without murmur, rub, or gallop.  ABDOMEN:  Protuberant.  Good bowel sounds.  No hepatomegaly.  EXTREMITIES:  No cyanosis, clubbing.  1+  edema.  Pulses were bounding.  NEUROLOGIC:  Intact.   ASSESSMENT:  In addition to the above problem list, Evan [name]'s blood pressure  is out of control.  I explained to him that Metoprolol is not a great blood  pressure medicine.  Much of this is probably his weight and dietary  noncompliance.   PLAN:  Add amlodipine 5 mg a day.  He promises to check a few blood  pressures at [place] or [employer] and record them for [place].  We will see him  back in four weeks for careful check.   Please note the discrepancy in his blood pressures in his upper extremities.  He is having no chest pain or anything suggesting a dissection.  I have  asked him, however, to have his blood pressures always checked in his left  arm.      Thomas C. Daleen Squibb, MD, [facility]     TCW/MedQ  DD:  05/25/2006  DT:  05/28/2006  Job #:  [id number]   cc:   Dr. Dietrich Pates [facility]

## 2010-12-23 NOTE — Cardiovascular Report (Signed)
[place]. [facility]  Patient:    Evan [name], Evan [name] Visit Number: [id number] MRN: [id number]          Service Type: MED Location: [phone] Attending Physician:  Lenoria Farrier Dictated by:   Jonelle Sidle, M.D. [facility] Proc. Date: 07/18/01 Admit Date:  07/18/2001                          Cardiac Catheterization  DATE OF BIRTH: Aug 15, 1955  PRIMARY [facility] CARDIOLOGIST: Everardo Beals. Juanda Chance, M.D.  INDICATIONS: The patient is a 55 year old male, with dyslipidemia, obesity, hypertension, and a known history of coronary artery disease, status post multiple percutaneous coronary interventions, most recently stent placement to a 90% proximal RCA lesion in February of this year. The patient presents now with progressive chest discomfort culminating in an episode of prolonged discomfort at rest that awoke the patient from sleep at 4 a.m. this morning. The patient is referred for cardiac catheterization to define the coronary anatomy and assess for further revascularization options.  PROCEDURES PERFORMED: 1. Left heart catheterization. 2. Selective coronary angiography. 3. Left ventriculography.  ACCESS AND EQUIPMENT: A 6 French sheath was placed in the right femoral artery. Standard preformed JL4 and JR4 catheters were used for selective coronary angiography. An angled pigtail catheter was used for left ventriculography.  COMPLICATIONS: None.  HEMODYNAMICS: Left ventricle 110/20 (postangiography). Aorta 104/72 mmHg.  ANGIOGRAPHIC FINDINGS: 1. The left main coronary artery is free of significant flow-limiting coronary    atherosclerosis. 2. The left anterior descending provides two diagonal branches and a large    septal perforator. There is 80% diffuse stenosis involving the mid and    distal LAD with small distal vessel size. Flow is TIMI-3. There is a 30%    stenosis involving the second diagonal as well as a 40% proximal stenosis  involving the large septal branch. 3. The circumflex coronary artery essentially provides a very small first    obtuse marginal branch and a large bifurcating second obtuse marginal    branch. There is a 30% circumflex stenosis in the proximal vessel. The    large second obtuse marginal branch has a 90% proximal stenosis and one    of its sub-branches is subtotally occluded. 4. The right coronary artery is dominant and supplies a large posterior    descending branch. There is a 70% proximal stenosis following a small    ectatic segment. The proximal stent site is patent with 20% stenosis just    distal to the site. There is 30-40% stenosis involving the posterolateral    branch.  LEFT VENTRICULOGRAPHY: Left ventriculography reveals an ejection fraction of 50% with inferior wall hypokinesis. No significant mitral regurgitation is noted.  DIAGNOSES: 1. Multivessel coronary artery disease as outlined above with an 80% diffuse    mid to distal left anterior descending stenosis, 90% second obtuse marginal    stenosis with a subtotally occluded sub-branch, and a 70% proximal right    coronary artery stenosis. The stent site within the proximal right coronary    artery placed in February of this year is patent. 2. Left ventricular ejection fraction calculated at 50% with inferior wall    hypokinesis and no significant mitral regurgitation.  RECOMMENDATIONS: Reviewed previous films from February. Discussed the current case with Dr. Chales Abrahams. Will plan to proceed with percutaneous coronary intervention to the second obtuse marginal branch and most likely in a staged fashion also address the proximal  RCA. Dictated by:   Jonelle Sidle, M.D. [facility] Attending Physician:  Lenoria Farrier DD:  07/18/01 TD:  07/18/01 Job: [id number]

## 2010-12-23 NOTE — Cardiovascular Report (Signed)
[place]. [facility]  Patient:    Evan [name], Evan [name]                     MRN: [id number] Proc. Date: 09/21/00 Adm. Date:  [id number] Attending:  Nathen May CC:         Jesse Sans. Wall, M.D. [facility]  Dr. Glenis Smoker  Cardiopulmonary Laboratory   Cardiac Catheterization  PROCEDURES PERFORMED:  Cardiac catheterization.  CLINICAL HISTORY:  Evan [name] is 55 years old and has documented coronary disease with a previous myocardial infarction at [facility], a previous stent placed to his LAD and a previous PTCA of his right coronary artery.  He was last catheterized in 1999 at which time he had a patent stent and some distal LAD disease and small vessel disease with overall good LV function.  He was seen in the office today by Dian Queen and Loraine Leriche Pulsipher with recurrent chest pain and he was sent to [facility] by ambulance with heparin drip.  DESCRIPTION OF PROCEDURE:  The procedure was performed via the right femoral artery using an arterial sheath and 6 French preformed coronary catheters.  A front wall arterial puncture was performed and Omnipaque contrast was used. After completion of the diagnostic study we made a decision to proceed with intervention of the right coronary artery.  The patient had been consented through the VICC trial and was given either Isovue or Visipaque.  He was given weight-adjusted heparin to prolong the ACT to greater than 200 seconds and was given double bolus Integrilin and infusion.  We used a 6 [place] guiding catheter with side holes and a short floppy wire.  We crossed the lesion in the proximal right coronary with the wire without difficulty.  We direct stented with a 3.0 x 18 mm Bx Velocity pulling this with one inflation of 13 atmospheres for 40 seconds.  We then postdilated with a 3.5 x 15 mm Quantum Ranger in the proximal portion of the stent up to the proximal edge.  We deployed this with one inflation  of 13 atmospheres for 35 seconds.  Repeat diagnostic studies were then performed through the guiding catheter.  The patient tolerated the procedure well and left the laboratory in satisfactory condition.  RESULTS: The aortic pressure was 163/101 with a mean of 128.  Left ventricular pressure was 163/46.  The left main coronary artery:  The left main coronary artery was free of significant disease.  Left anterior descending: The left anterior descending artery gave rise to a moderately large diagonal branch and a septal perforator.  There was less than 20% stenosis at the stent site in the proximal LAD.  There were tandem 80% stenosis in the distal LAD with irregularities in the distal LAD.  There was 40% narrowing in the first diagonal branch.  Circumflex artery: The circumflex artery gave rise to an intermediate branch, an atrial branch, and two posterolateral branches.  There was total occlusion of a sub-branch of one of the posterolateral branch filling from the other posterolateral branch by collaterals.  Right coronary artery: The right coronary is a moderate sized vessel that gave rise to a marginal branch that fed part of the inferior septum and a small posterior descending branch.  There were tandem 40% proximal stenoses. There was another 90% stenosis with segmental disease in the proximal vessel. The posterior descending branch had a 95% stenosis near its terminal portion.  LEFT VENTRICULOGRAPHY: The left ventriculogram performed in the RAO  projection showed hypokinesis of the mid inferior wall.  The overall wall motion was good with a calculated ejection fraction of 51%.  Following stenting of the right coronary artery the stenosis improved from 90% to 0%. There is no dissection seen.  CONCLUSIONS: 1. Coronary artery disease, status post multiple percutaneous interventions    described above with less than 20% narrowing at the stent site in the    proximal left  anterior descending, tandem 80% stenoses in the distal left    anterior descending, total occlusion of the small sub-branch of a    posterolateral branch to the circumflex artery, 40% proximal and 90%    proximal stenosis in the right coronary artery with 95% stenosis in the    terminal portion of the posterior descending branch with mid inferior wall    hypo to akinesis. 2. Successful stenting of the proximal right coronary artery stenosis with    improvement in percent diameter narrowing from 90% to 0%.  DISPOSITION:  The patient was returned to the postangioplasty unit for further observation. DD:  09/21/00 TD:  09/22/00 Job: [id number]

## 2011-01-23 ENCOUNTER — Encounter: Payer: Self-pay | Admitting: Cardiology

## 2011-02-25 ENCOUNTER — Emergency Department: Payer: Medicare Other | Admitting: Emergency Medicine

## 2011-02-25 IMAGING — CR DG CHEST 2V
1 series · 2 of 2 positions shown · non-contrast
Comparison: none

REASON FOR EXAM: FEVER, COUGH
COMMENTS:

[Series 1: view not recorded · 0.17mm/px · 2 of 2 slices shown]
[im 1/2]
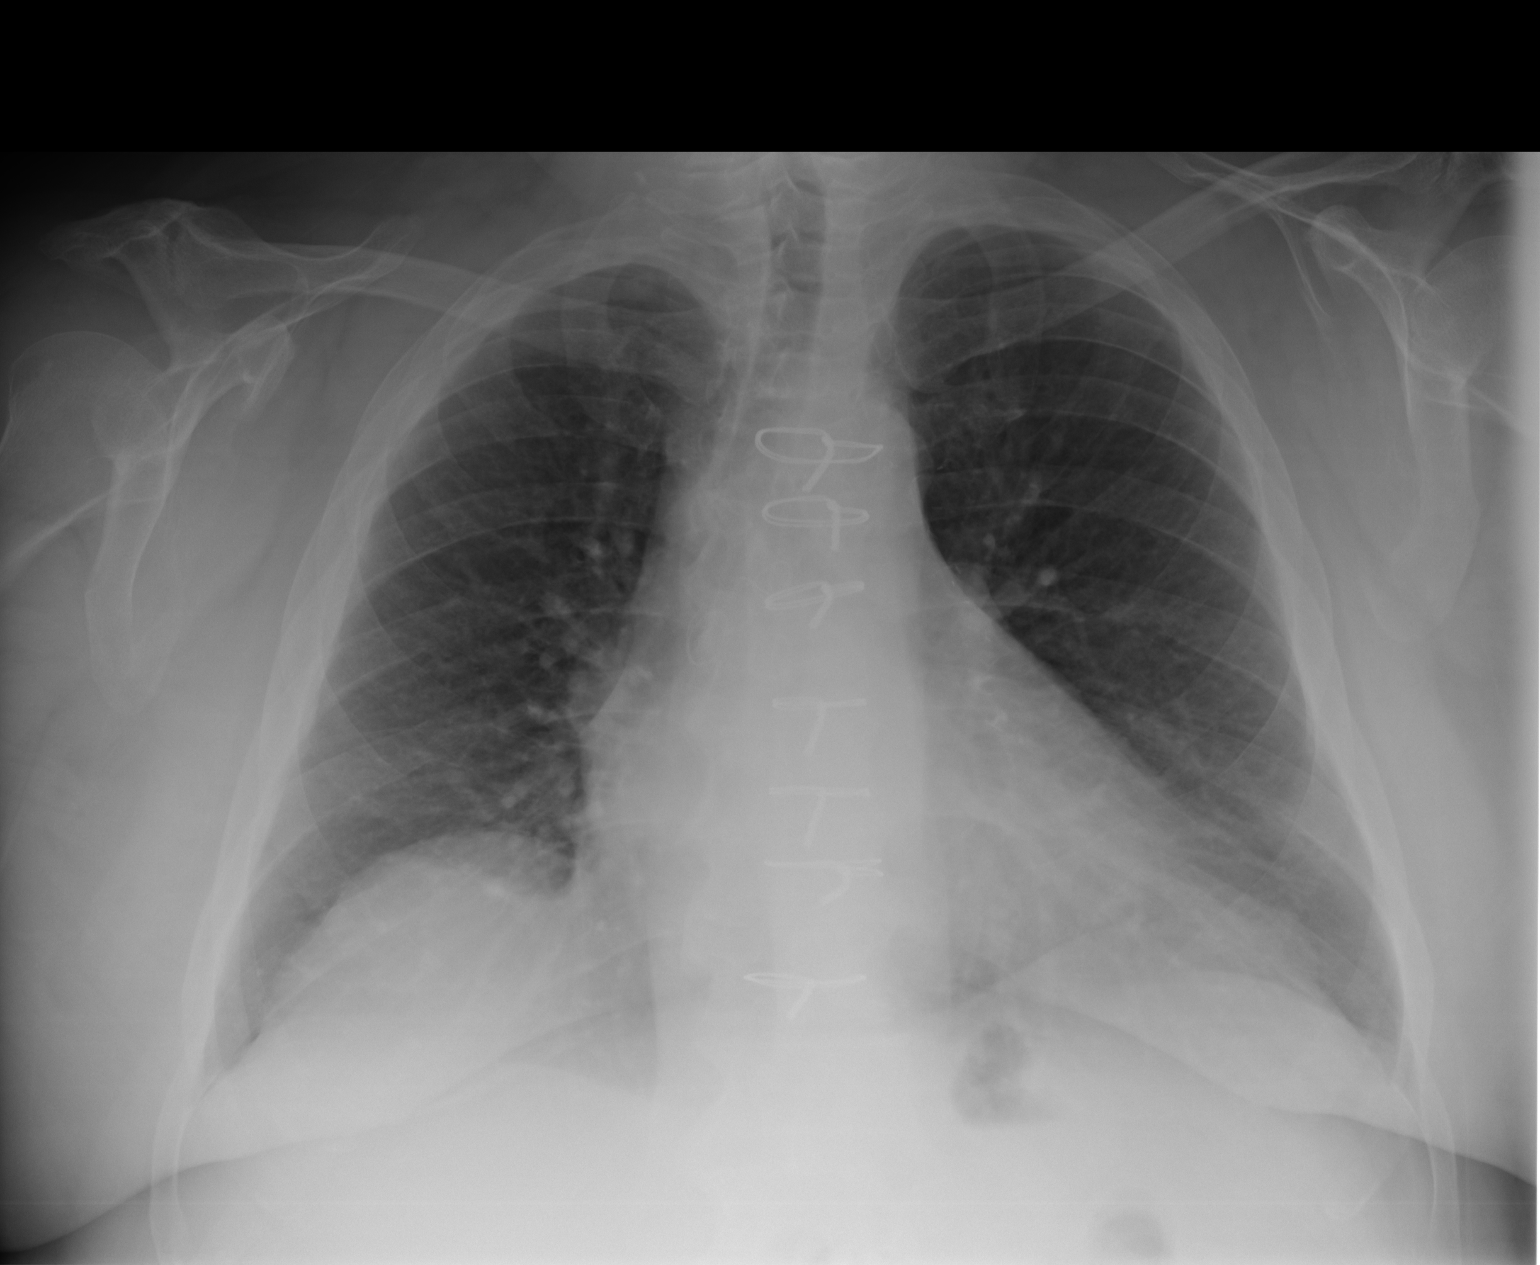
[im 2/2]
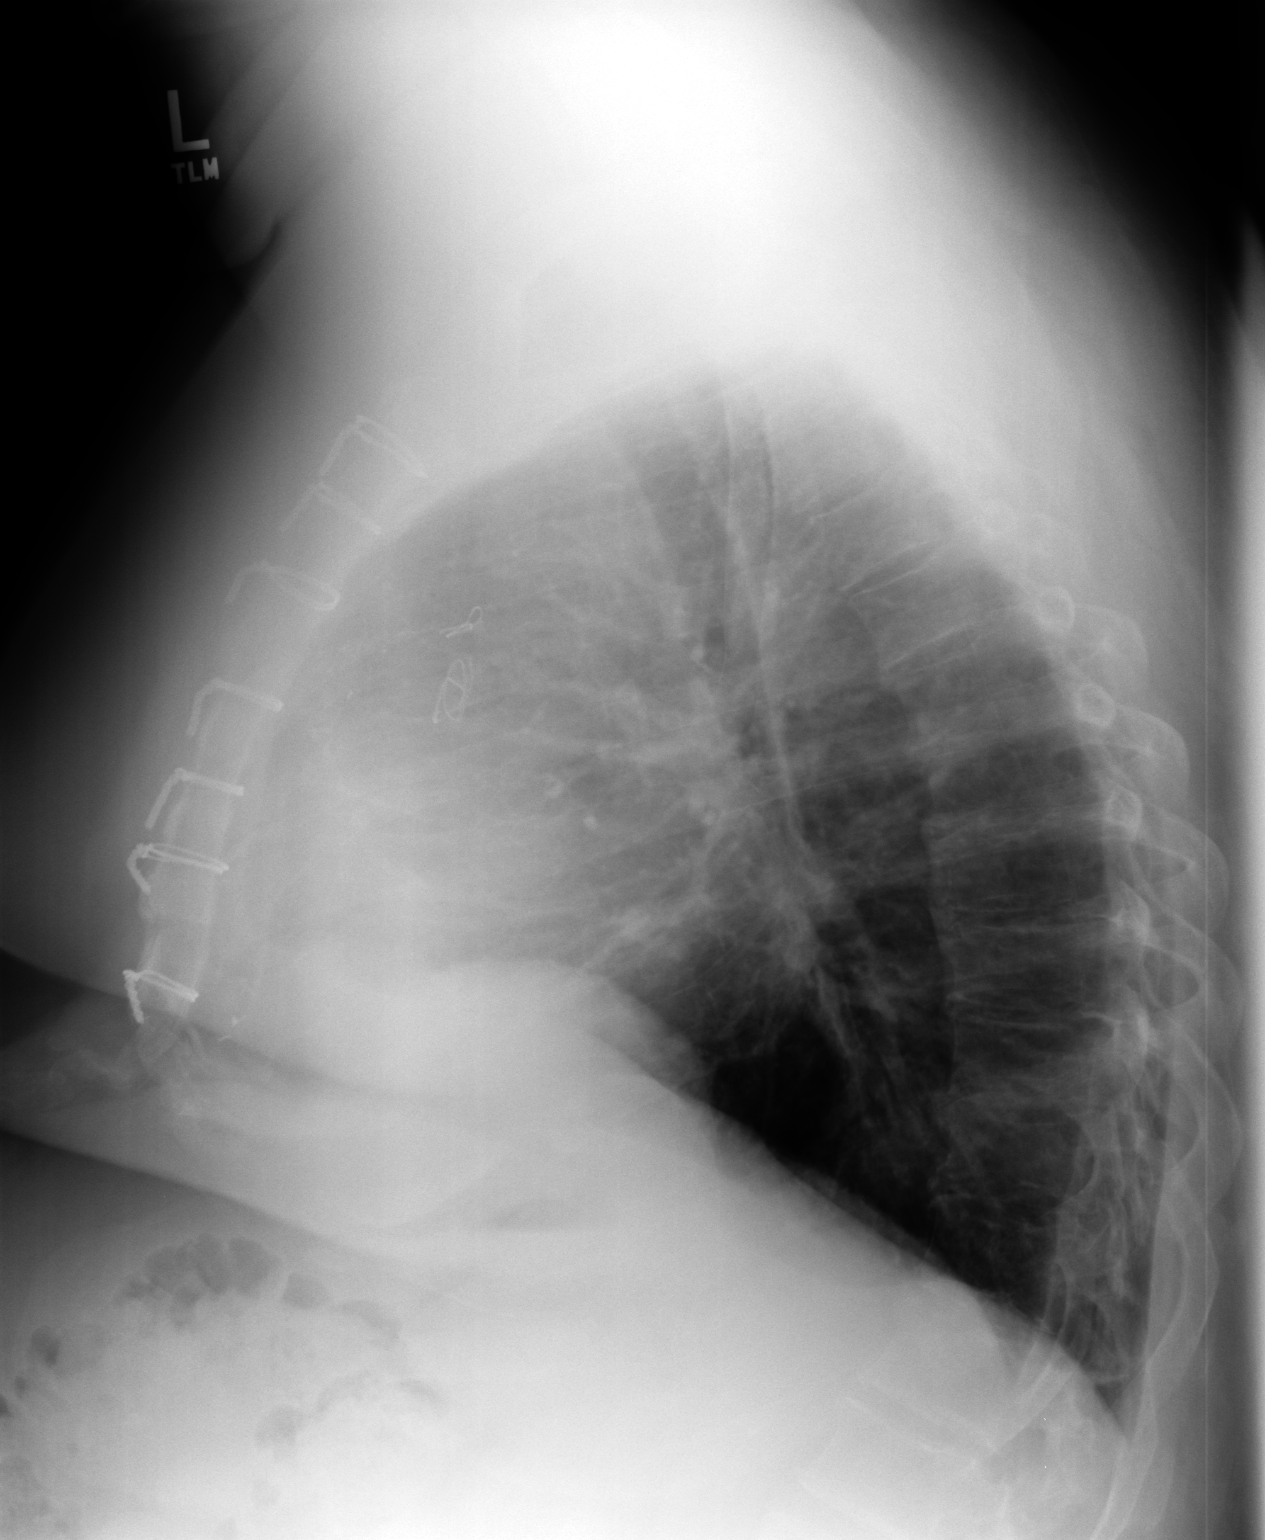

[2 of 2 positions shown; findings below may reference images not displayed]

PROCEDURE:     DXR - DXR CHEST PA (OR AP) AND LATERAL  - [DATE]  [DATE]

RESULT:     Comparison is made to a study of [DATE].

CABG changes are present. The heart is borderline enlarged but stable. There
is no edema, infiltrate, effusion or pneumothorax. The bony and mediastinal
structures are otherwise unremarkable with degenerative changes noted in the
spine.
IMPRESSION: No acute cardiopulmonary disease. Stable appearance.

## 2011-03-24 ENCOUNTER — Ambulatory Visit: Payer: Self-pay | Admitting: Cardiology

## 2011-04-21 ENCOUNTER — Ambulatory Visit: Payer: Self-pay | Admitting: Cardiology

## 2011-05-05 LAB — HEMOGLOBIN A1C
Hgb A1c MFr Bld: 5.8
Mean Plasma Glucose: 129

## 2011-05-05 LAB — TSH: TSH: 4.186

## 2011-05-30 ENCOUNTER — Ambulatory Visit: Payer: Self-pay | Admitting: Cardiology

## 2011-06-02 ENCOUNTER — Ambulatory Visit: Payer: Self-pay | Admitting: Cardiology

## 2011-06-16 ENCOUNTER — Encounter: Payer: Self-pay | Admitting: *Deleted

## 2011-06-23 ENCOUNTER — Ambulatory Visit: Payer: Self-pay | Admitting: Cardiology

## 2011-07-26 ENCOUNTER — Encounter: Payer: Self-pay | Admitting: Cardiology

## 2011-07-26 ENCOUNTER — Ambulatory Visit (INDEPENDENT_AMBULATORY_CARE_PROVIDER_SITE_OTHER): Payer: Medicare Other | Admitting: Cardiology

## 2011-07-26 VITALS — BP 152/88 | HR 79 | Resp 18 | Ht 65.0 in | Wt 274.0 lb

## 2011-07-26 DIAGNOSIS — R739 Hyperglycemia, unspecified: Secondary | ICD-10-CM

## 2011-07-26 DIAGNOSIS — I10 Essential (primary) hypertension: Secondary | ICD-10-CM

## 2011-07-26 DIAGNOSIS — I252 Old myocardial infarction: Secondary | ICD-10-CM

## 2011-07-26 DIAGNOSIS — E785 Hyperlipidemia, unspecified: Secondary | ICD-10-CM

## 2011-07-26 DIAGNOSIS — I25810 Atherosclerosis of coronary artery bypass graft(s) without angina pectoris: Secondary | ICD-10-CM

## 2011-07-26 DIAGNOSIS — E663 Overweight: Secondary | ICD-10-CM

## 2011-07-26 DIAGNOSIS — E782 Mixed hyperlipidemia: Secondary | ICD-10-CM

## 2011-07-26 DIAGNOSIS — R7309 Other abnormal glucose: Secondary | ICD-10-CM

## 2011-07-26 LAB — BASIC METABOLIC PANEL
BUN: 16 mg/dL (ref 6–23)
CO2: 25 mEq/L (ref 19–32)
Calcium: 9.1 mg/dL (ref 8.4–10.5)
Chloride: 109 mEq/L (ref 96–112)
Creatinine, Ser: 0.9 mg/dL (ref 0.4–1.5)
GFR: 98.79 mL/min (ref >60.00)
Glucose, Bld: 118 mg/dL — ABNORMAL HIGH (ref 70–99)
Potassium: 3.6 mEq/L (ref 3.5–5.1)
Sodium: 142 mEq/L (ref 135–145)

## 2011-07-26 LAB — HEPATIC FUNCTION PANEL
ALT: 32 U/L (ref 0–53)
AST: 27 U/L (ref 0–37)
Albumin: 3.9 g/dL (ref 3.5–5.2)
Alkaline Phosphatase: 80 U/L (ref 39–117)
Bilirubin, Direct: 0.1 mg/dL (ref 0.0–0.3)
Total Bilirubin: 0.6 mg/dL (ref 0.3–1.2)
Total Protein: 7.1 g/dL (ref 6.0–8.3)

## 2011-07-26 LAB — LIPID PANEL
Cholesterol: 257 mg/dL — ABNORMAL HIGH (ref 0–200)
HDL: 46.5 mg/dL (ref >39.00)
Total CHOL/HDL Ratio: 6
Triglycerides: 145 mg/dL (ref 0.0–149.0)
VLDL: 29 mg/dL (ref 0.0–40.0)

## 2011-07-26 LAB — HEMOGLOBIN A1C: Hgb A1c MFr Bld: 6.3 % (ref 4.6–6.5)

## 2011-07-26 NOTE — Assessment & Plan Note (Signed)
Will check blood work today

## 2011-07-26 NOTE — Patient Instructions (Addendum)
Your physician wants you to follow-up in: 1 year.  You will receive a reminder letter in the mail two months in advance. If you don't receive a letter, please call our office to schedule the follow-up appointment.  Your physician recommends that you have the following lab work today: Lipid and Liver profile, BMET, and Hgb A1C.
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
## 2011-07-26 NOTE — Assessment & Plan Note (Signed)
He needs to lose at least 30 pounds or more. He is becoming a diabetic was emphasized with this subsequent complications.

## 2011-07-26 NOTE — Assessment & Plan Note (Signed)
Under suboptimal control. Weight loss strongly advised

## 2011-07-26 NOTE — Assessment & Plan Note (Signed)
Stable. No change in treatment. Sublingual nitroglycerin given.

## 2011-07-27 LAB — LDL CHOLESTEROL, DIRECT: Direct LDL: 178.5 mg/dL

## 2011-07-31 ENCOUNTER — Telehealth: Payer: Self-pay | Admitting: Cardiology

## 2011-07-31 NOTE — Telephone Encounter (Signed)
New problem:  Test results.  

## 2011-07-31 NOTE — Telephone Encounter (Signed)
Called back for lab results. Advised that he needed to restart Simvastatin. States he hasn't had the money to buy medications. He also will be seeing a new physician at the clinic where he goes. Advised he needs to let them know that is blood sugar is elevated and that we can send lab results to them once he knows the physicians name. Also states he has been having a lot of cramps in arms and legs. He states his PCP gave him the Simvastatin. Advised to also let them know that they may need to change his cholesterol medication due to cramping. He will follow up with the clinic in January.

## 2011-08-11 ENCOUNTER — Encounter: Payer: Self-pay | Admitting: *Deleted

## 2012-02-14 DIAGNOSIS — E1129 Type 2 diabetes mellitus with other diabetic kidney complication: Secondary | ICD-10-CM | POA: Insufficient documentation

## 2012-02-14 DIAGNOSIS — E1169 Type 2 diabetes mellitus with other specified complication: Secondary | ICD-10-CM | POA: Insufficient documentation

## 2012-02-14 DIAGNOSIS — E119 Type 2 diabetes mellitus without complications: Secondary | ICD-10-CM | POA: Insufficient documentation

## 2012-02-15 ENCOUNTER — Emergency Department: Payer: Self-pay | Admitting: *Deleted

## 2012-06-05 ENCOUNTER — Telehealth: Payer: Self-pay | Admitting: Cardiology

## 2012-06-05 NOTE — Telephone Encounter (Signed)
New Problem:    Patient called in wanting to know what the date of his last surgery was.  Please call back.

## 2012-06-06 NOTE — Telephone Encounter (Signed)
Pt calls for assistance in filling out his disability form.  This was accomplished over the phone. Pt needed information as to what year he had  his CABG surgery. Appointment reminder mailed to pt.  His new pcp is Dr. Remi Haggard. Mylo Red RN

## 2012-07-27 ENCOUNTER — Emergency Department: Payer: Self-pay | Admitting: Emergency Medicine

## 2012-07-27 LAB — CBC
HCT: 41.8 % (ref 40.0–52.0)
HGB: 13.6 g/dL (ref 13.0–18.0)
MCH: 28.7 pg (ref 26.0–34.0)
MCHC: 32.6 g/dL (ref 32.0–36.0)
MCV: 88 fL (ref 80–100)
Platelet: 220 10*3/uL (ref 150–440)
RBC: 4.75 10*6/uL (ref 4.40–5.90)
RDW: 14.9 % — ABNORMAL HIGH (ref 11.5–14.5)
WBC: 10.7 10*3/uL — ABNORMAL HIGH (ref 3.8–10.6)

## 2012-07-27 LAB — COMPREHENSIVE METABOLIC PANEL
Albumin: 3.7 g/dL (ref 3.4–5.0)
Alkaline Phosphatase: 111 U/L (ref 50–136)
Anion Gap: 7 (ref 7–16)
BUN: 15 mg/dL (ref 7–18)
Bilirubin,Total: 0.5 mg/dL (ref 0.2–1.0)
Calcium, Total: 8.8 mg/dL (ref 8.5–10.1)
Chloride: 110 mmol/L — ABNORMAL HIGH (ref 98–107)
Co2: 24 mmol/L (ref 21–32)
Creatinine: 0.85 mg/dL (ref 0.60–1.30)
EGFR (African American): >60
EGFR (Non-African Amer.): >60
Glucose: 145 mg/dL — ABNORMAL HIGH (ref 65–99)
Osmolality: 285 (ref 275–301)
Potassium: 3.7 mmol/L (ref 3.5–5.1)
SGOT(AST): 23 U/L (ref 15–37)
SGPT (ALT): 30 U/L (ref 12–78)
Sodium: 141 mmol/L (ref 136–145)
Total Protein: 7.4 g/dL (ref 6.4–8.2)

## 2012-07-27 LAB — PROTIME-INR
INR: 0.8
Prothrombin Time: 11.8 secs (ref 11.5–14.7)

## 2012-07-27 LAB — URINALYSIS, COMPLETE
Bacteria: NONE SEEN
Bilirubin,UR: NEGATIVE
Blood: NEGATIVE
Glucose,UR: NEGATIVE mg/dL (ref 0–75)
Ketone: NEGATIVE
Leukocyte Esterase: NEGATIVE
Nitrite: NEGATIVE
Ph: 8 (ref 4.5–8.0)
Protein: NEGATIVE
RBC,UR: 1 /HPF (ref 0–5)
Specific Gravity: 1.014 (ref 1.003–1.030)
Squamous Epithelial: NONE SEEN
WBC UR: NONE SEEN /HPF (ref 0–5)

## 2012-07-27 LAB — LIPASE, BLOOD: Lipase: 123 U/L (ref 73–393)

## 2012-07-27 LAB — MAGNESIUM: Magnesium: 1.9 mg/dL

## 2012-07-27 LAB — HEMOGLOBIN: HGB: 13.7 g/dL (ref 13.0–18.0)

## 2012-07-27 IMAGING — CT CT ABD-PELV W/ CM
1 of 2 series · 15 of 32 positions shown, 19 images · IV contrast (isovue)
Comparison: None

REASON FOR EXAM: (1) abd pain with rectal bleeding, eval for
diverticulitis; (2) same
COMMENTS:

PROCEDURE:     CT  - CT ABDOMEN / PELVIS  W  - [DATE] [DATE]
RESULT:     History: Rectal bleeding
TECHNIQUE: Multiple axial images of the abdomen and pelvis were performed
from the lung bases to the pubic symphysis, without p.o. contrast and with
100 ml of Isovue 370 intravenous contrast.

[Series 2: 3mm soft tissue · axial · 0.98mm/px · z∈[-1071,-645]mm · 15 of 158 slices shown, 19 images]
[im 8/158  soft-tissue]
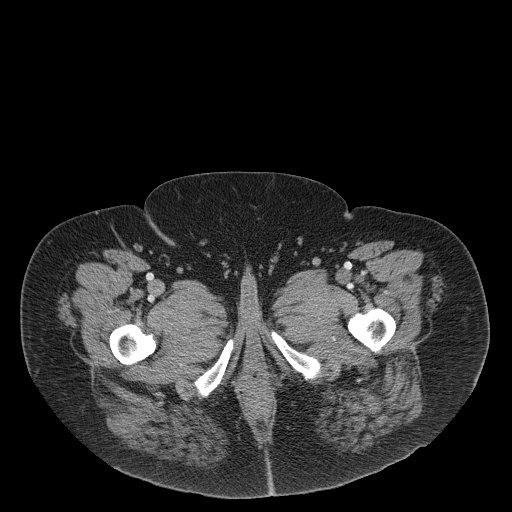
[im 8/158  bone]
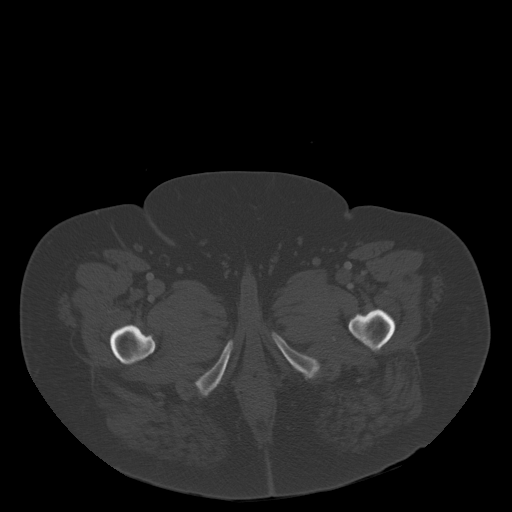
[im 22/158  soft-tissue]
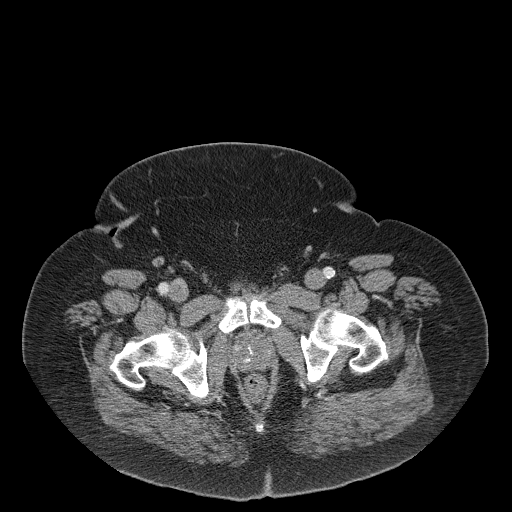
[im 36/158  soft-tissue]
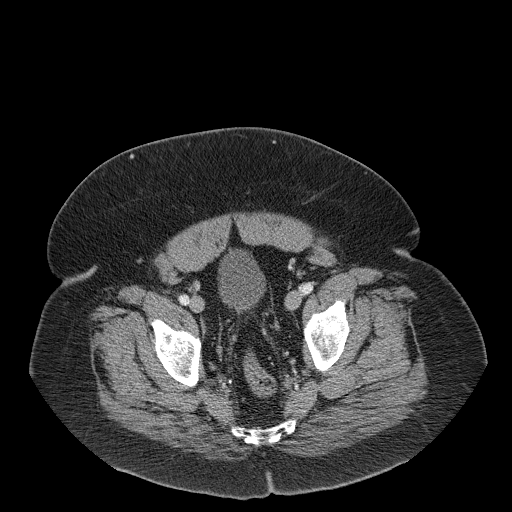
[im 43/158  soft-tissue]
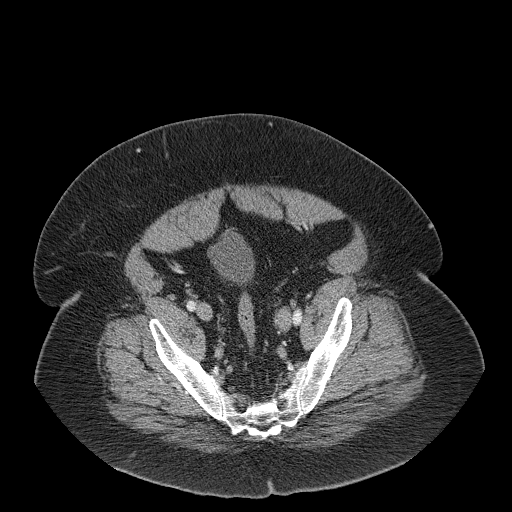
[im 58/158  soft-tissue]
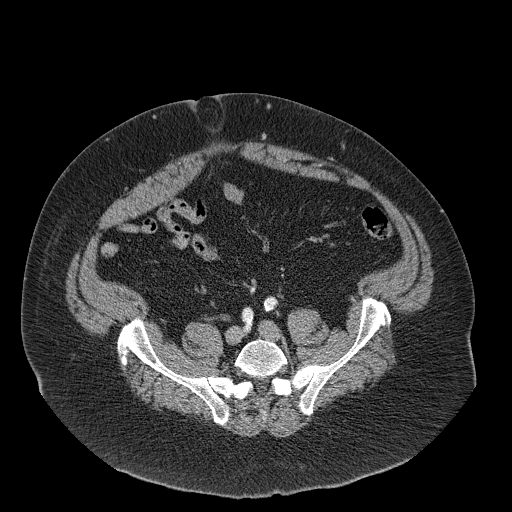
[im 65/158  soft-tissue]
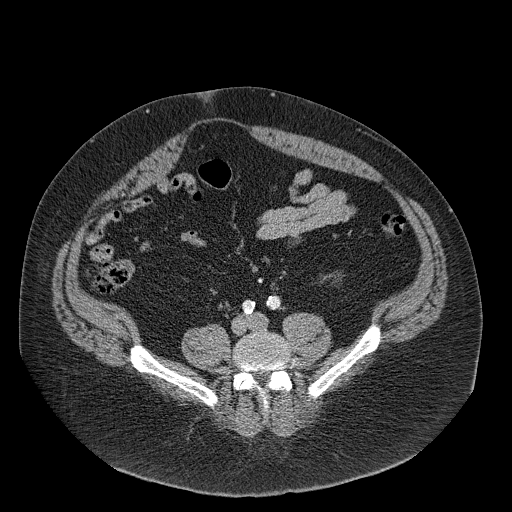
[im 79/158  soft-tissue]
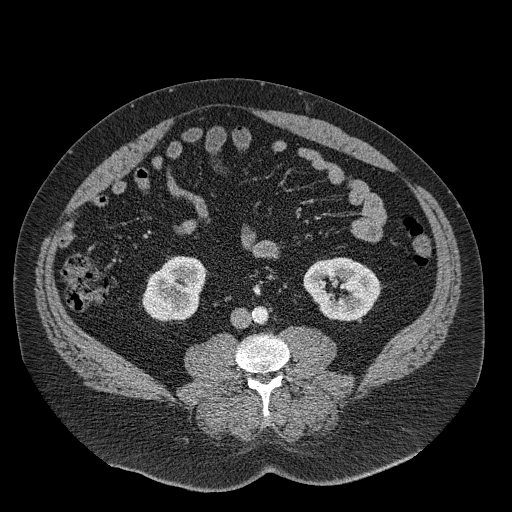
[im 93/158  soft-tissue]
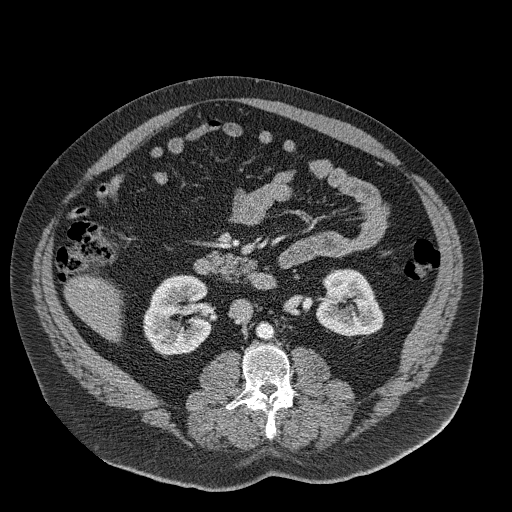
[im 100/158  soft-tissue]
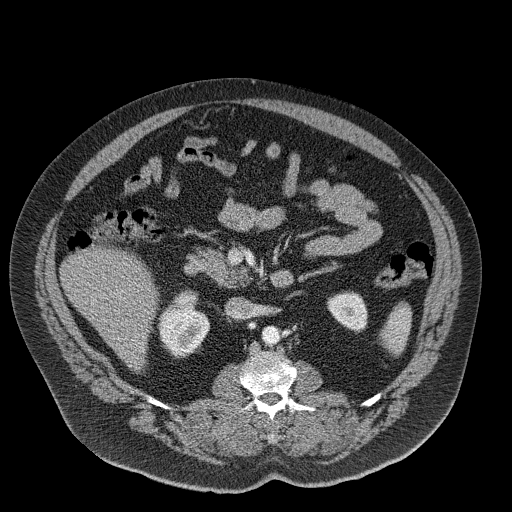
[im 100/158  bone]
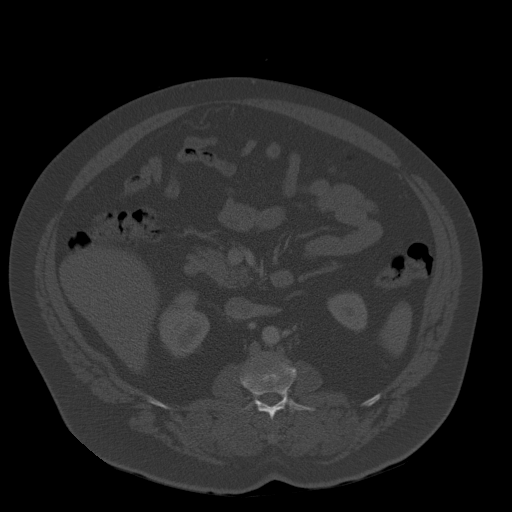
[im 115/158  soft-tissue]
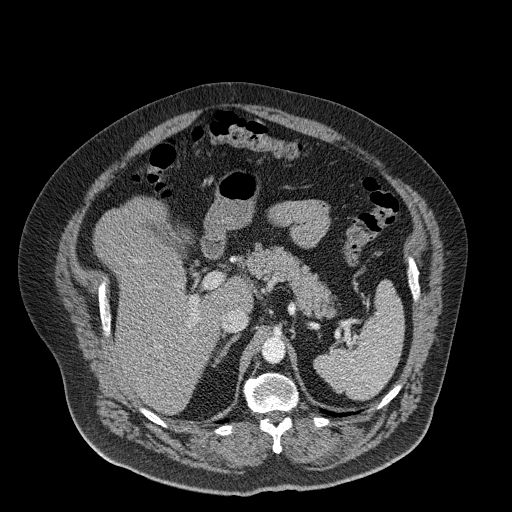
[im 122/158  soft-tissue]
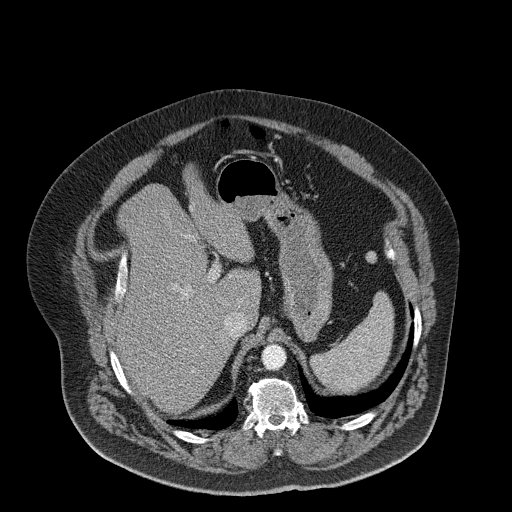
[im 129/158  lung]
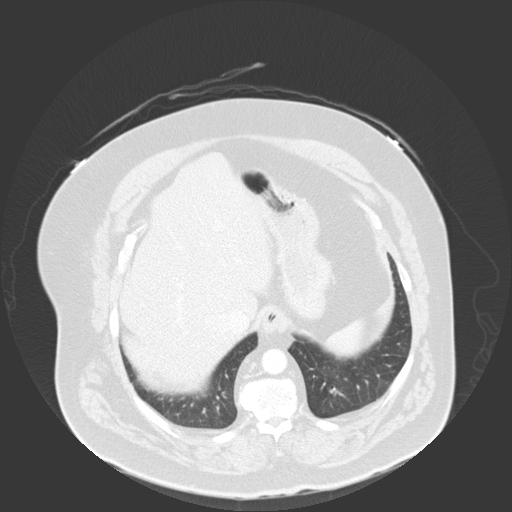
[im 136/158  soft-tissue]
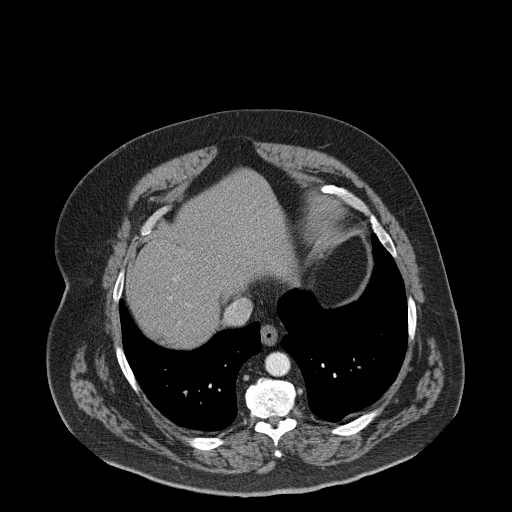
[im 136/158  lung]
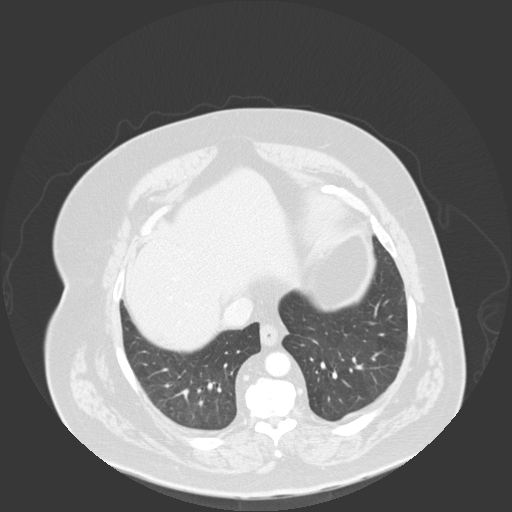
[im 143/158  lung]
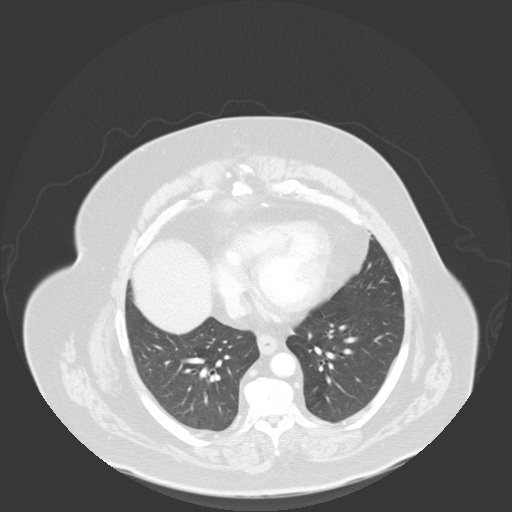
[im 150/158  soft-tissue]
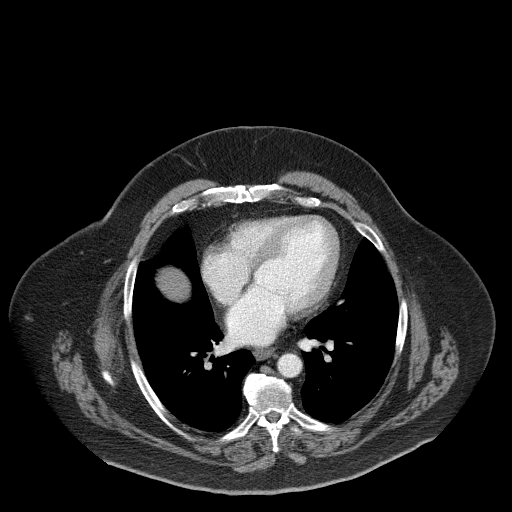
[im 150/158  lung]
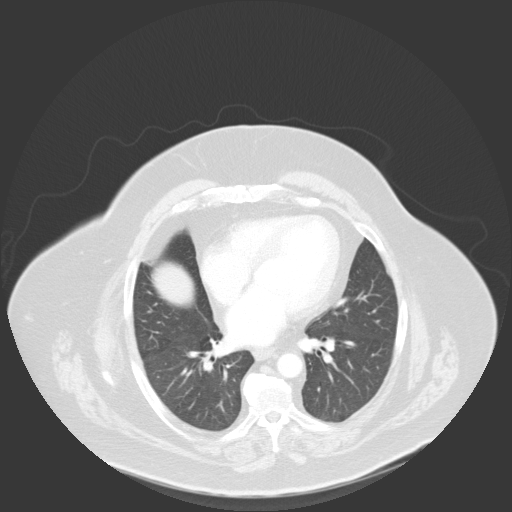

[15 of 32 positions shown; findings below may reference images not displayed]

FINDINGS: The lung bases are clear. There is no pneumothorax. The heart size is
normal.

The liver demonstrates no focal abnormality. There is no intrahepatic or
extrahepatic biliary ductal dilatation. The gallbladder is unremarkable. The
spleen demonstrates no focal abnormality. The kidneys, adrenal glands, and
pancreas are normal. The bladder is unremarkable.

The stomach, duodenum, small intestine, and large intestine demonstrate no
contrast extravasation or dilatation.  There is no pneumoperitoneum,
pneumatosis, or portal venous gas. There is no abdominal or pelvic free
fluid. There is no lymphadenopathy.

The abdominal aorta is normal in caliber with atherosclerosis.

The osseous structures are unremarkable.
IMPRESSION: 1. No acute abdominal or pelvic pathology.

[REDACTED]

## 2012-07-27 IMAGING — CR DG CHEST 2V
1 series · 3 of 3 positions shown · non-contrast
Comparison: none

REASON FOR EXAM: cough
COMMENTS:

PROCEDURE:     DXR - DXR CHEST PA (OR AP) AND LATERAL  - [DATE] [DATE]
RESULT:     Comparison: [DATE]

[Series 1: pa · 0.17mm/px · 3 of 3 slices shown]
[im 1/3]
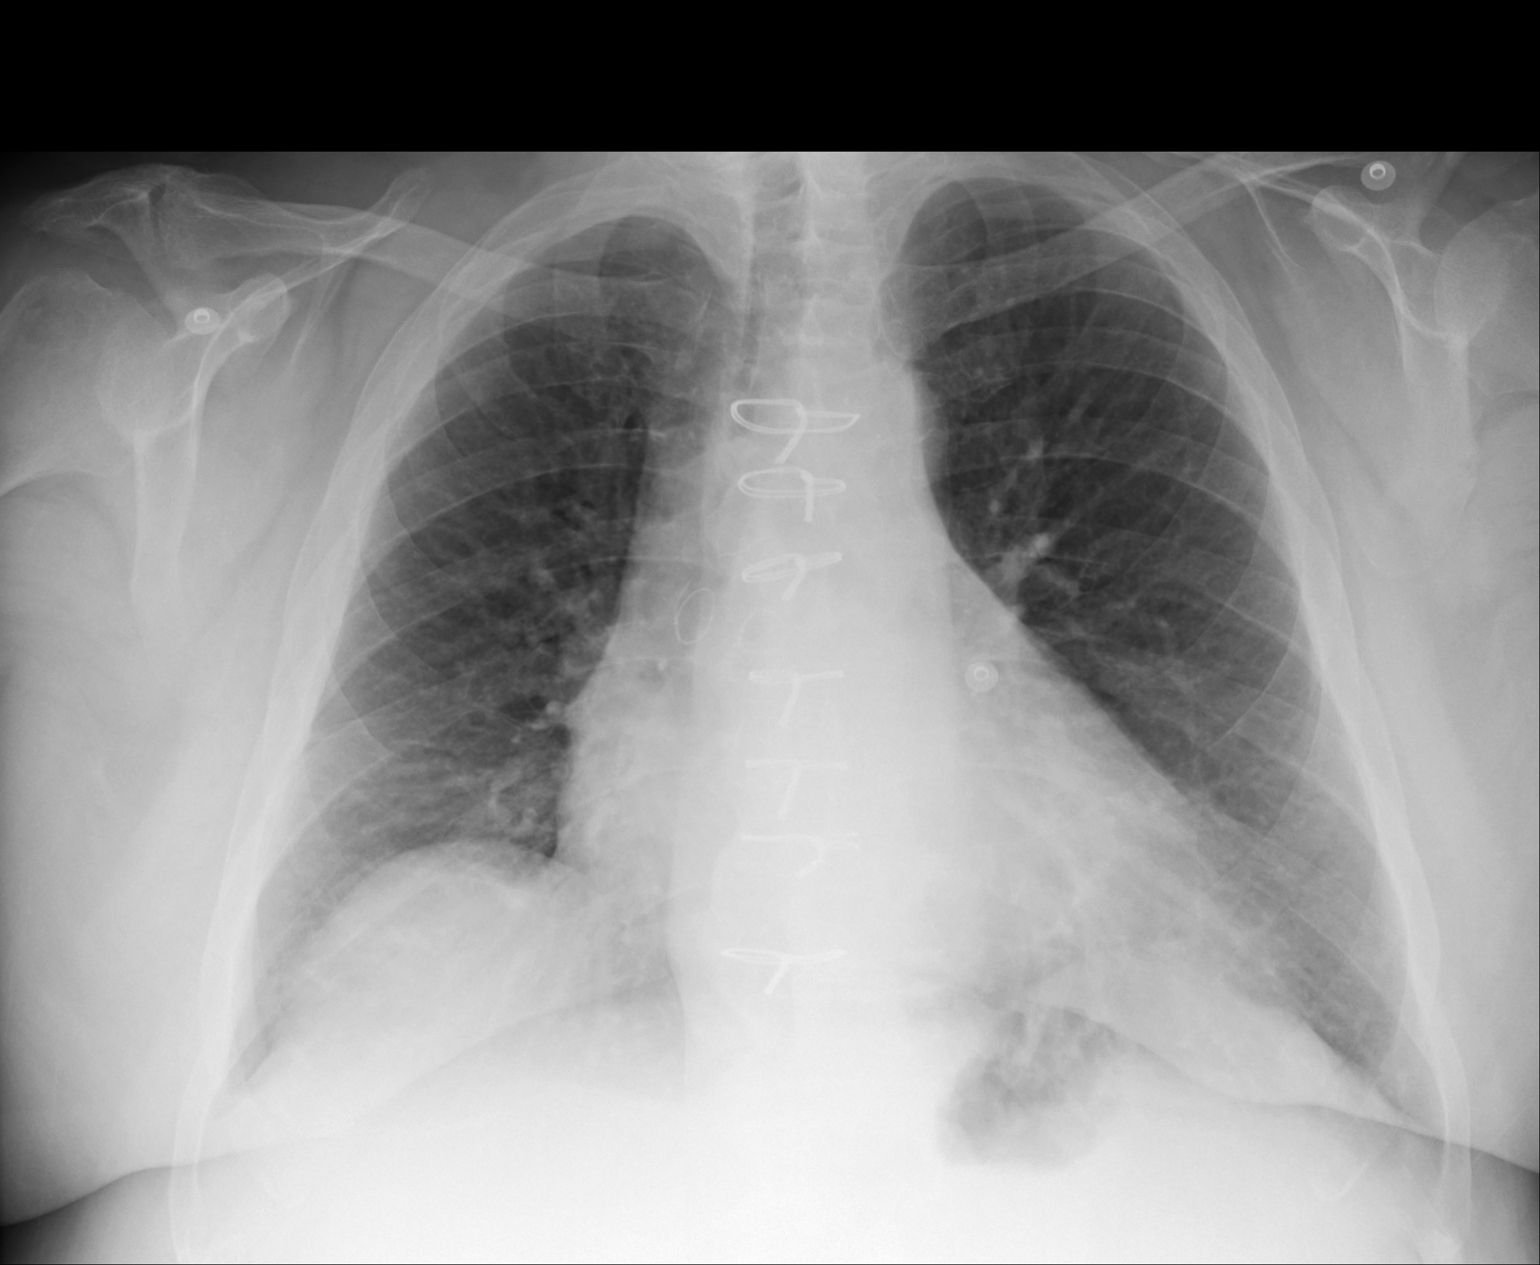
[im 2/3]
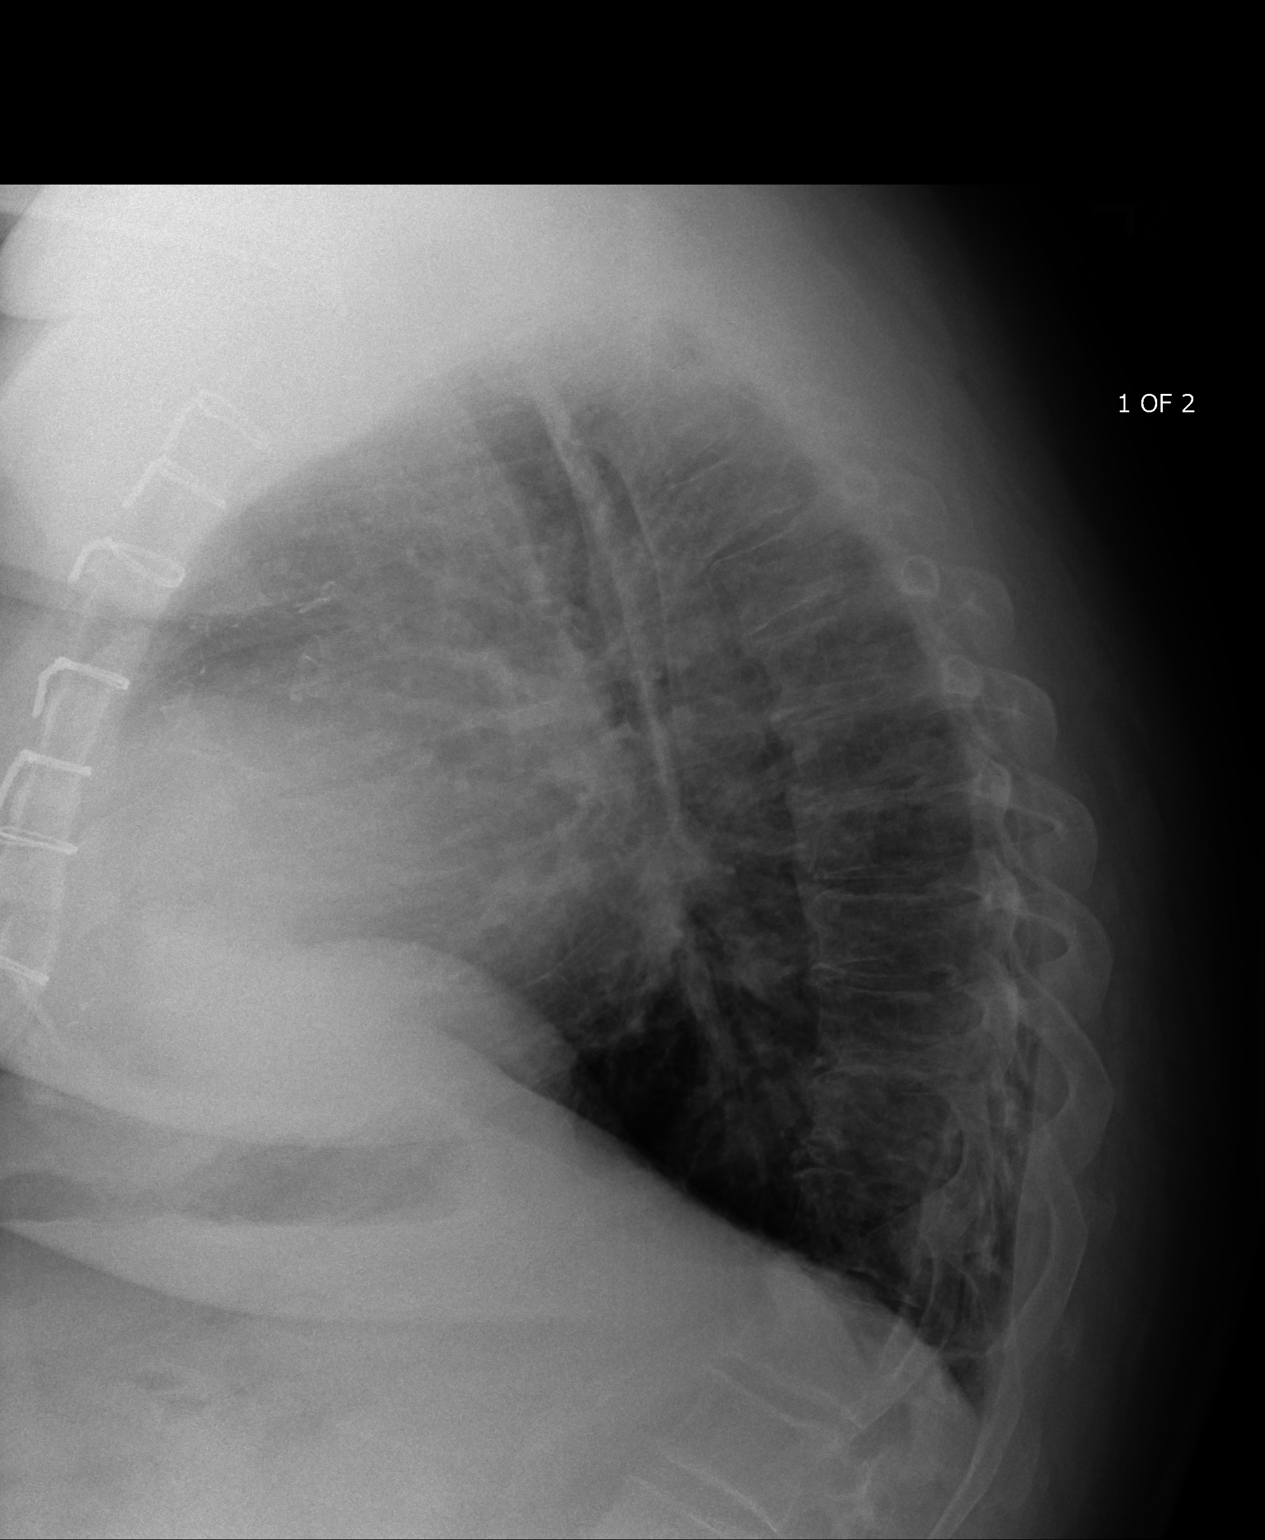
[im 3/3]
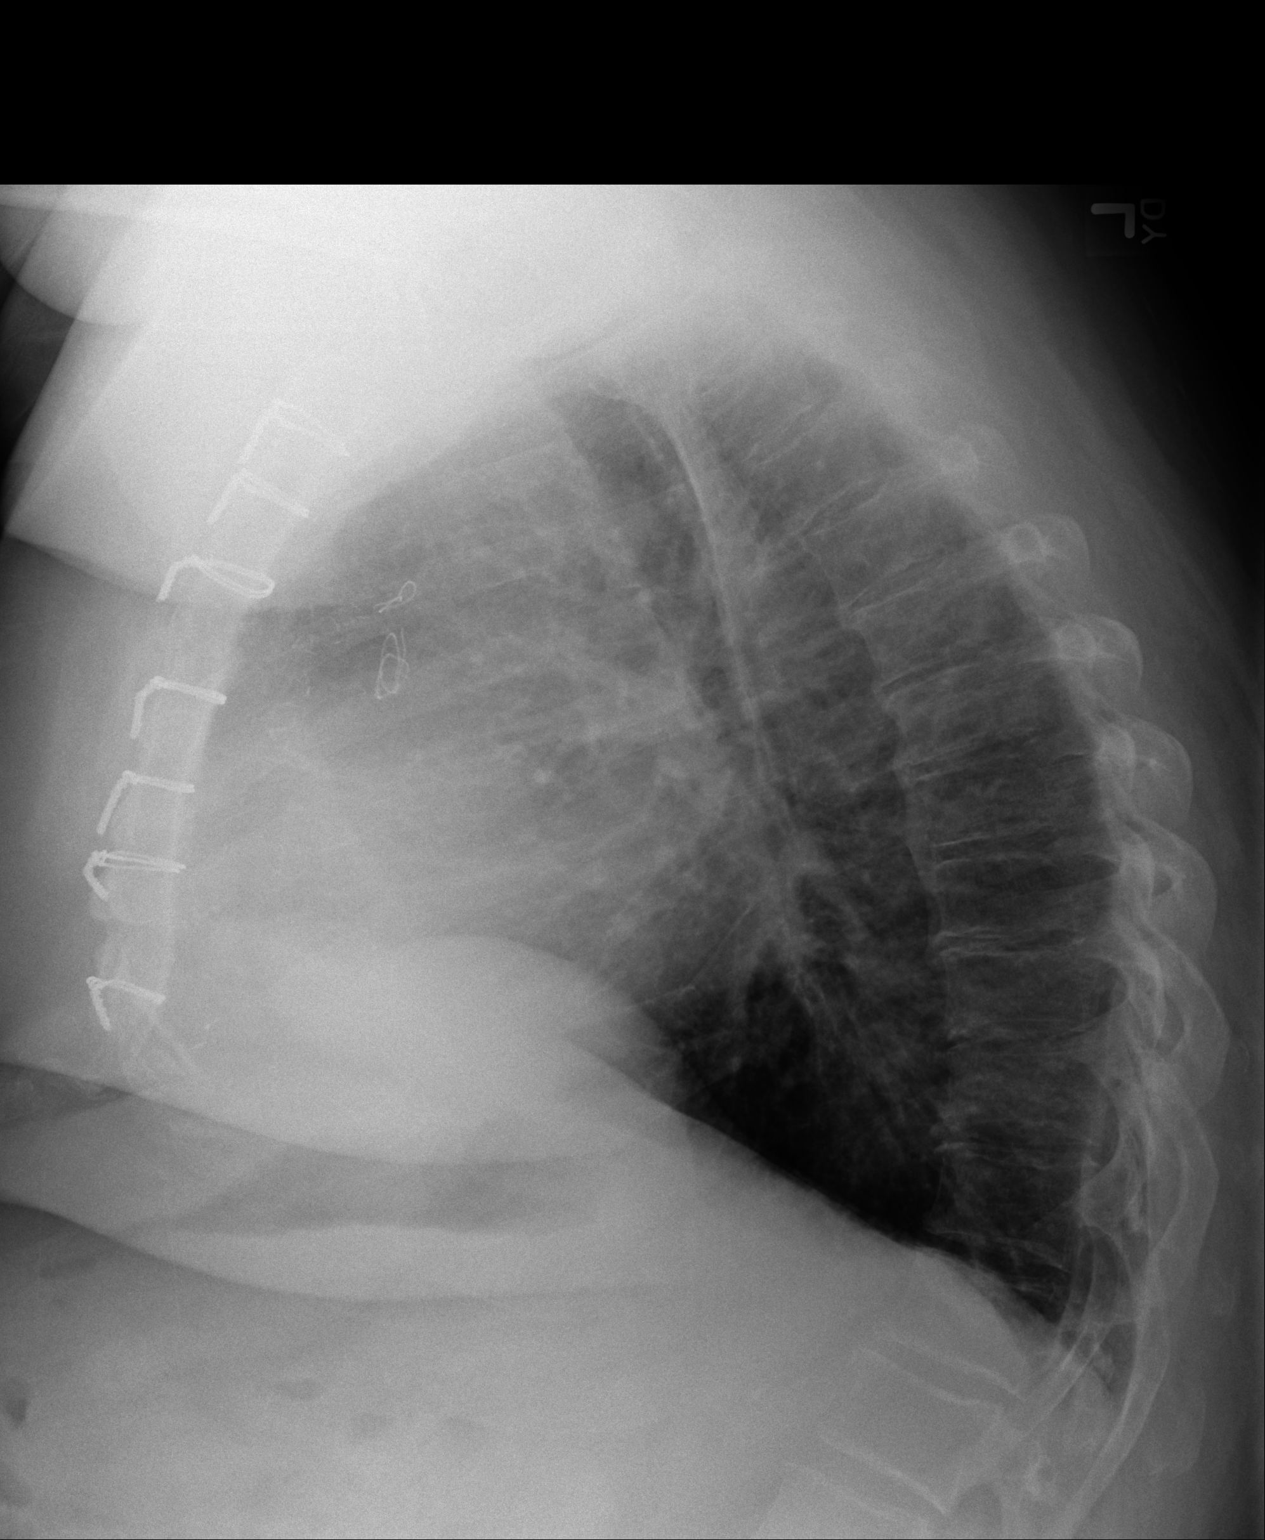

[3 of 3 positions shown; findings below may reference images not displayed]

FINDINGS: PA and lateral chest radiographs are provided.  There is no focal
parenchymal opacity, pleural effusion, or pneumothorax. The heart and
mediastinum are unremarkable. Prior CABG. The osseous structures are
unremarkable.
IMPRESSION: No acute disease of the che[REDACTED]

## 2012-08-22 ENCOUNTER — Encounter: Payer: Self-pay | Admitting: Cardiology

## 2012-08-22 ENCOUNTER — Ambulatory Visit (INDEPENDENT_AMBULATORY_CARE_PROVIDER_SITE_OTHER): Payer: Medicare Other | Admitting: Cardiology

## 2012-08-22 VITALS — BP 142/96 | HR 85 | Ht 65.0 in | Wt 282.0 lb

## 2012-08-22 DIAGNOSIS — I10 Essential (primary) hypertension: Secondary | ICD-10-CM

## 2012-08-22 DIAGNOSIS — I2119 ST elevation (STEMI) myocardial infarction involving other coronary artery of inferior wall: Secondary | ICD-10-CM

## 2012-08-22 DIAGNOSIS — I252 Old myocardial infarction: Secondary | ICD-10-CM

## 2012-08-22 DIAGNOSIS — E782 Mixed hyperlipidemia: Secondary | ICD-10-CM

## 2012-08-22 DIAGNOSIS — I25810 Atherosclerosis of coronary artery bypass graft(s) without angina pectoris: Secondary | ICD-10-CM

## 2012-08-22 NOTE — Patient Instructions (Addendum)
Continue taking your medications as prescribed  Your physician wants you to follow-up in: 1 year with Dr. Daleen Squibb. You will receive a reminder letter in the mail two months in advance. If you don't receive a letter, please call our office to schedule the follow-up appointment.

## 2012-08-22 NOTE — Assessment & Plan Note (Signed)
Encouraged to lose weight

## 2012-08-22 NOTE — Assessment & Plan Note (Signed)
Stable. We have given him some funds to get his medicines at the [facility]. They cost him $17 a month.

## 2012-08-22 NOTE — Assessment & Plan Note (Signed)
Continue diet and statin.

## 2012-08-22 NOTE — Progress Notes (Signed)
HPI Evan [name] returns today for evaluation and management coronary artery disease. He is under tremendous amount of financial stress having lost his home has had a lot of transportation issues. He is getting some of his medications now from the [facility]. He's run out of his ranitidine but says he has 4 more Lopressor.  He denies any angina or chest discomfort. He is very stressed out today. He has gained about 20-30 more pounds. He says he doesn't eat very much.  Past Medical History  Diagnosis Date  . CAD (coronary artery disease)   . Hypertension   . Hyperlipidemia   . Gastroesophageal reflux   . Obstructive sleep apnea   . History of pneumonia     Remote  . History of scarlet fever     Childhood  . Obesity     Current Outpatient Prescriptions  Medication Sig Dispense Refill  . aspirin 81 MG EC tablet Take 81 mg by mouth daily.        . metoprolol tartrate (LOPRESSOR) 25 MG tablet Take 25 mg by mouth 2 (two) times daily.        . ranitidine (ZANTAC) 150 MG capsule Take 150 mg by mouth 2 (two) times daily.        . simvastatin (ZOCOR) 80 MG tablet Take 40 mg by mouth at bedtime.          No Known Allergies  Family History  Problem Relation Age of Onset  . Coronary artery disease Other     History   Social History  . Marital Status: Single    Spouse Name: N/A    Number of Children: N/A  . Years of Education: N/A   Occupational History  .      disabled   Social History Main Topics  . Smoking status: Never Smoker   . Smokeless tobacco: Never Used  . Alcohol Use: No  . Drug Use: No  . Sexually Active: Not on file   Other Topics Concern  . Not on file   Social History Narrative   DisabledSingle    ROS ALL NEGATIVE EXCEPT THOSE NOTED IN HPI  PE  General Appearance: well developed, well nourished in no acute distress, morbidly obese HEENT: symmetrical face, PERRLA, good dentition  Neck: no JVD, thyromegaly, or adenopathy, trachea midline Chest:  symmetric without deformity, sternal keloid Cardiac: PMI hard to feel, RRR, normal S1, S2, no gallop or murmur Lung: clear to ausculation and percussion Vascular: all pulses full without bruits  Abdominal: nondistended, nontender, good bowel sounds, no HSM, no bruits Extremities: no cyanosis, clubbing ,no sign of DVT, no varicosities, 1+ pitting edema  Skin: normal color, no rashes Neuro: alert and oriented x 3, non-focal Pysch: normal affect  EKG Normal sinus rhythm, ST segment depression with T wave inversion in the lateral leads, no change from previous ECG.  BMET    Component Value Date/Time   NA 142 07/26/2011 1234   K 3.6 07/26/2011 1234   CL 109 07/26/2011 1234   CO2 25 07/26/2011 1234   GLUCOSE 118* 07/26/2011 1234   BUN 16 07/26/2011 1234   CREATININE 0.9 07/26/2011 1234   CALCIUM 9.1 07/26/2011 1234   GFRNONAA 108.00 04/13/2010 0822    Lipid Panel     Component Value Date/Time   CHOL 257* 07/26/2011 1234   TRIG 145.0 07/26/2011 1234   HDL 46.50 07/26/2011 1234   CHOLHDL 6 07/26/2011 1234   VLDL 29.0 07/26/2011 1234   LDLCALC 166* 11/03/2008  2155    CBC No results found for this basename: wbc, rbc, hgb, hct, plt, mcv, mch, mchc, rdw, neutrabs, lymphsabs, monoabs, eosabs, basosabs

## 2012-10-11 ENCOUNTER — Emergency Department: Payer: Self-pay | Admitting: Emergency Medicine

## 2012-10-11 LAB — URINALYSIS, COMPLETE
Bacteria: NONE SEEN
Bilirubin,UR: NEGATIVE
Blood: NEGATIVE
Glucose,UR: NEGATIVE mg/dL (ref 0–75)
Ketone: NEGATIVE
Leukocyte Esterase: NEGATIVE
Nitrite: NEGATIVE
Ph: 5 (ref 4.5–8.0)
Protein: NEGATIVE
RBC,UR: <1 /HPF (ref 0–5)
Specific Gravity: 1.015 (ref 1.003–1.030)
Squamous Epithelial: NONE SEEN
WBC UR: 1 /HPF (ref 0–5)

## 2012-10-11 LAB — COMPREHENSIVE METABOLIC PANEL
Albumin: 3.8 g/dL (ref 3.4–5.0)
Alkaline Phosphatase: 104 U/L (ref 50–136)
Anion Gap: 7 (ref 7–16)
BUN: 16 mg/dL (ref 7–18)
Bilirubin,Total: 0.4 mg/dL (ref 0.2–1.0)
Calcium, Total: 8.5 mg/dL (ref 8.5–10.1)
Chloride: 107 mmol/L (ref 98–107)
Co2: 25 mmol/L (ref 21–32)
Creatinine: 0.84 mg/dL (ref 0.60–1.30)
EGFR (African American): >60
EGFR (Non-African Amer.): >60
Glucose: 133 mg/dL — ABNORMAL HIGH (ref 65–99)
Osmolality: 281 (ref 275–301)
Potassium: 3.9 mmol/L (ref 3.5–5.1)
SGOT(AST): 22 U/L (ref 15–37)
SGPT (ALT): 31 U/L (ref 12–78)
Sodium: 139 mmol/L (ref 136–145)
Total Protein: 7.8 g/dL (ref 6.4–8.2)

## 2012-10-11 LAB — TROPONIN I: Troponin-I: <0.02 ng/mL

## 2012-10-11 LAB — CBC
HCT: 43.8 % (ref 40.0–52.0)
HGB: 14.2 g/dL (ref 13.0–18.0)
MCH: 28.4 pg (ref 26.0–34.0)
MCHC: 32.5 g/dL (ref 32.0–36.0)
MCV: 88 fL (ref 80–100)
Platelet: 213 10*3/uL (ref 150–440)
RBC: 5.01 10*6/uL (ref 4.40–5.90)
RDW: 15 % — ABNORMAL HIGH (ref 11.5–14.5)
WBC: 12 10*3/uL — ABNORMAL HIGH (ref 3.8–10.6)

## 2012-10-11 LAB — CK TOTAL AND CKMB (NOT AT ARMC)
CK, Total: 365 U/L — ABNORMAL HIGH (ref 35–232)
CK-MB: 4 ng/mL — ABNORMAL HIGH (ref 0.5–3.6)

## 2012-10-12 IMAGING — CT CT HEAD WITHOUT CONTRAST
2 series · 16 of 30 positions shown, 20 images · non-contrast
Comparison: none

REASON FOR EXAM: LEFT BLURRY VISION
COMMENTS:   May transport without cardiac monitor

[Series 2: without · axial · non-contrast · 0.46mm/px · z∈[-98,+42]mm · 13 of 34 slices shown, 17 images]
[im 3/34  brain]
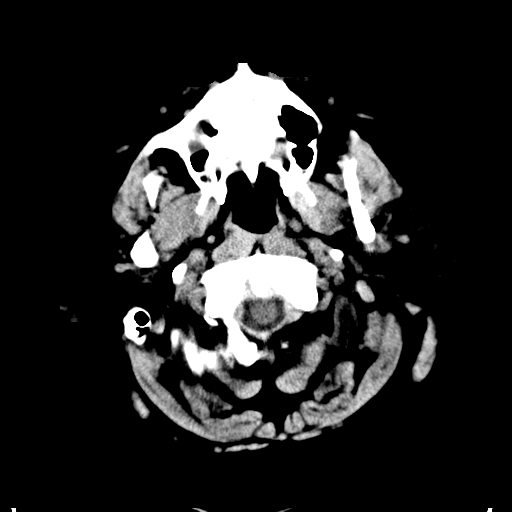
[im 3/34  bone]
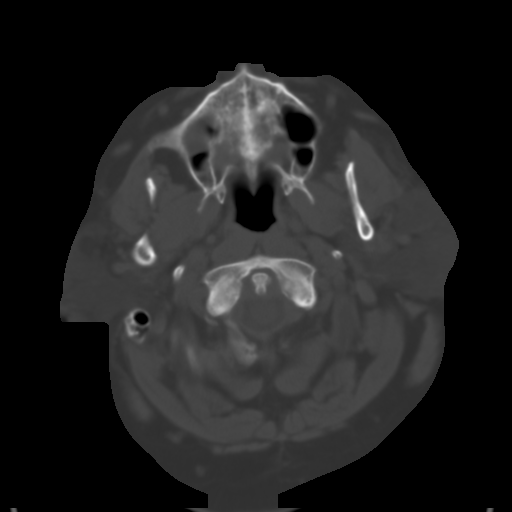
[im 5/34  brain]
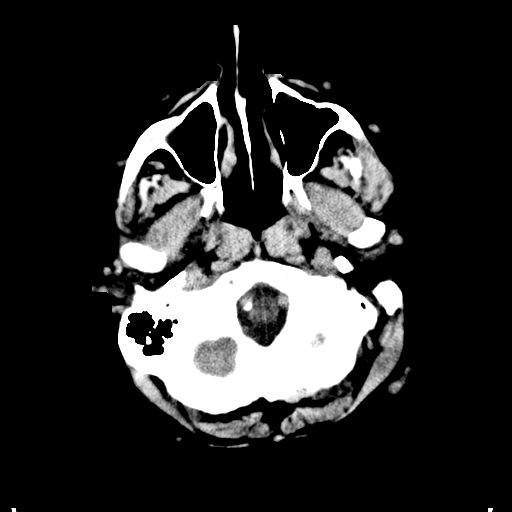
[im 8/34  brain]
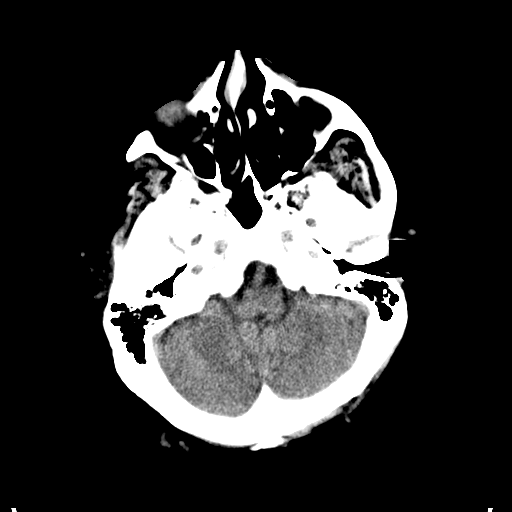
[im 10/34  brain]
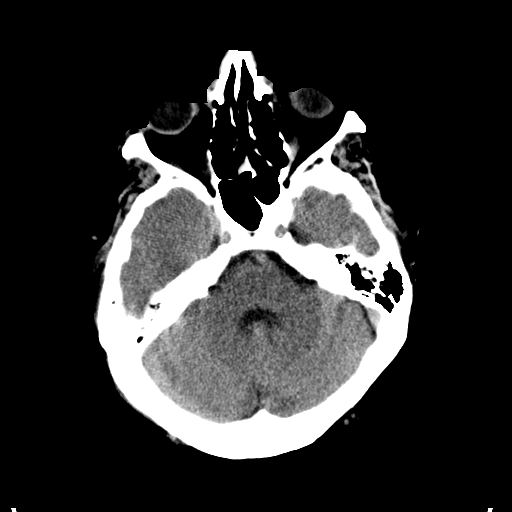
[im 12/34  brain]
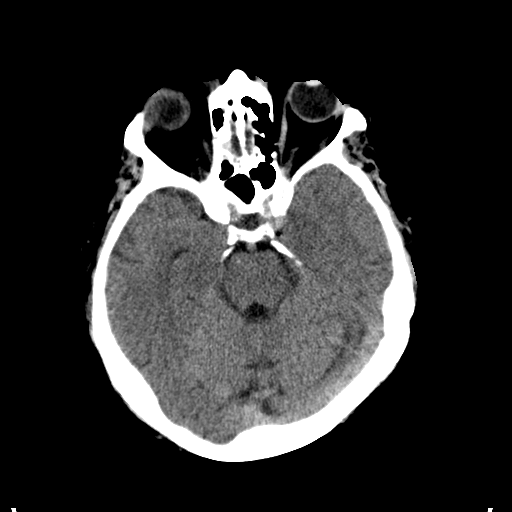
[im 12/34  bone]
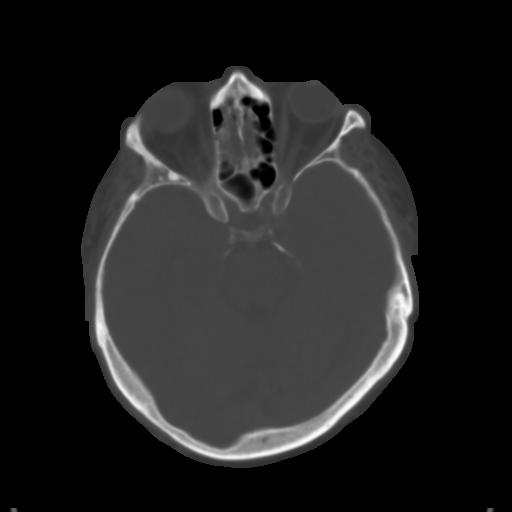
[im 15/34  brain]
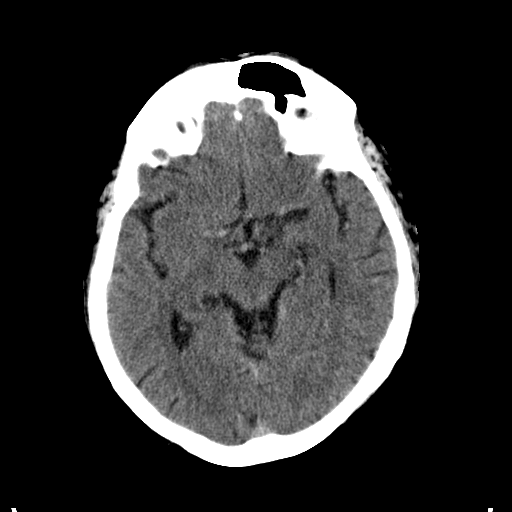
[im 17/34  brain]
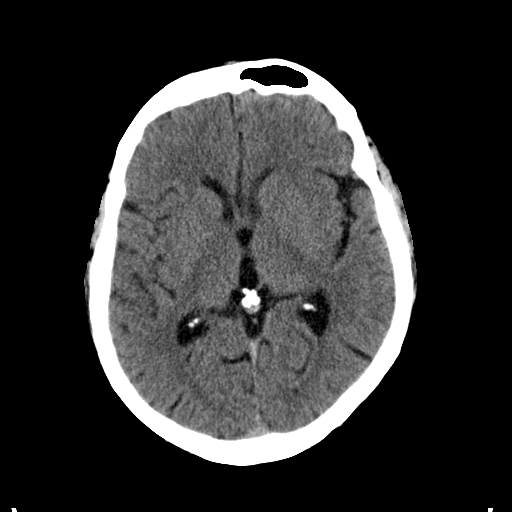
[im 19/34  brain]
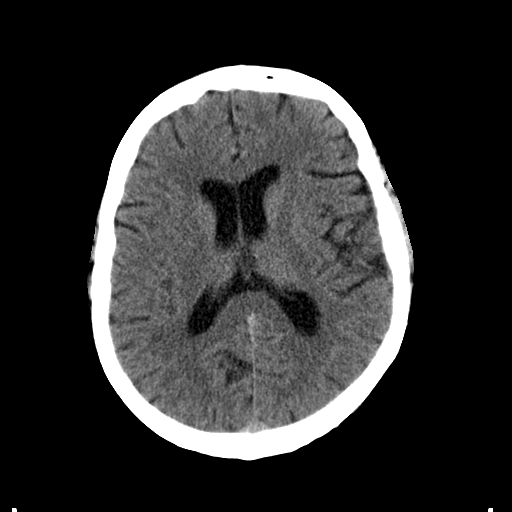
[im 22/34  brain]
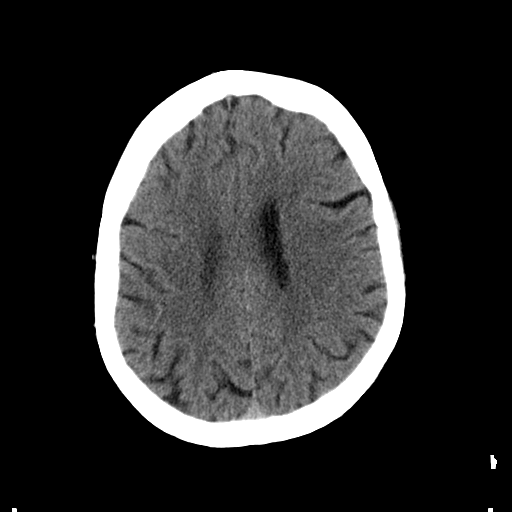
[im 22/34  bone]
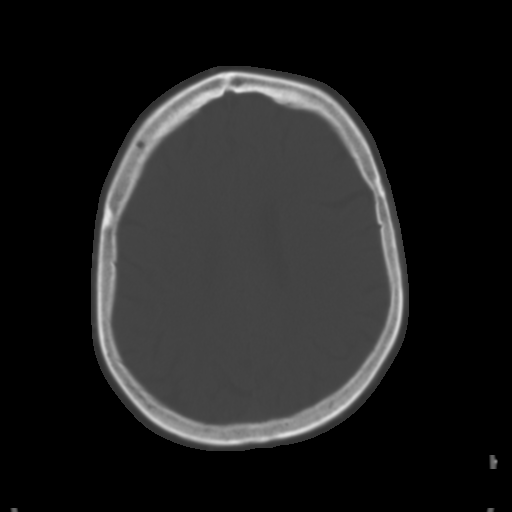
[im 24/34  brain]
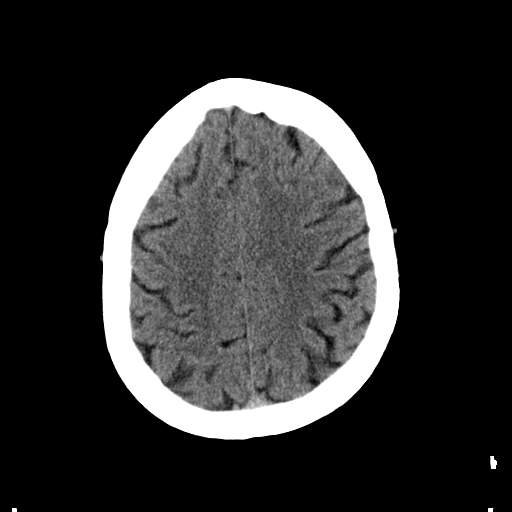
[im 26/34  brain]
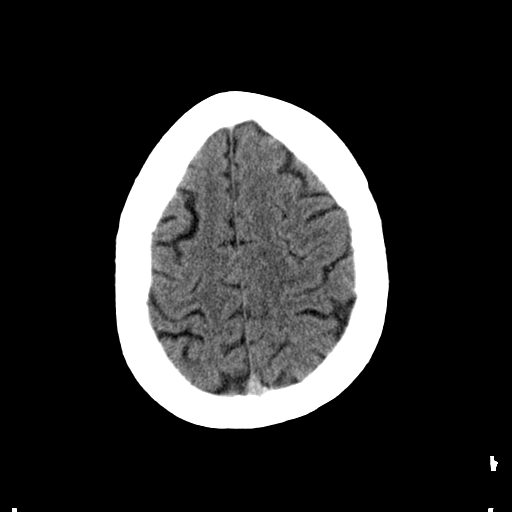
[im 29/34  brain]
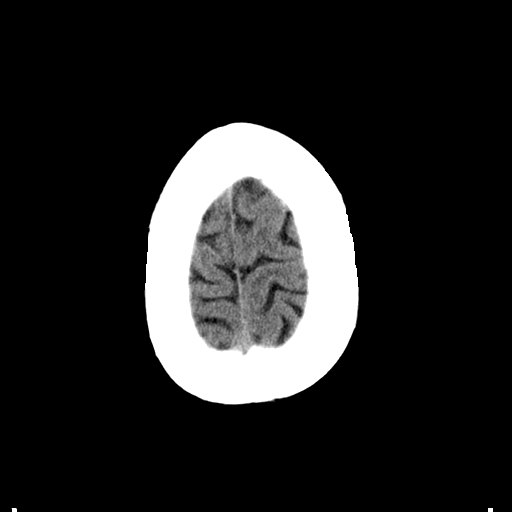
[im 31/34  brain]
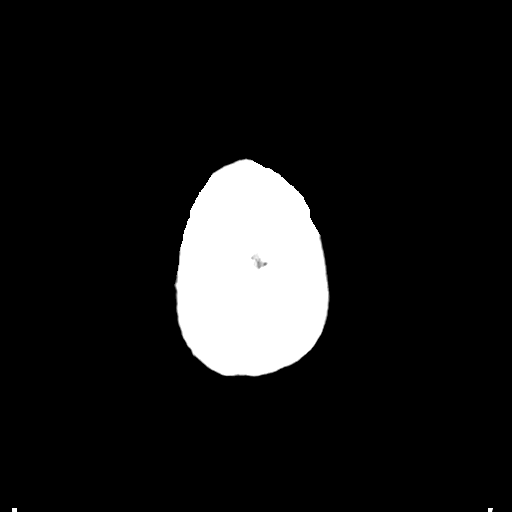
[im 31/34  bone]
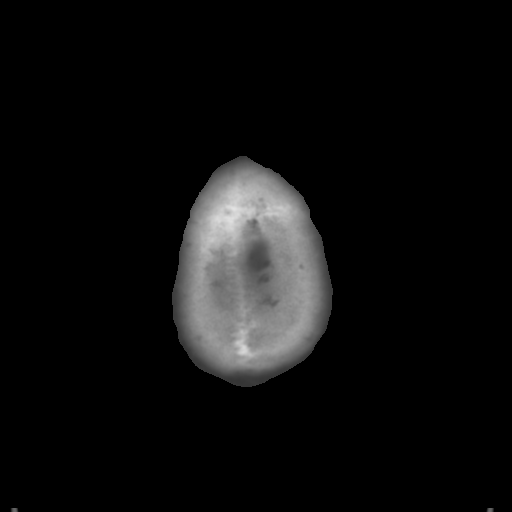

[Series 3: bone · axial · 0.46mm/px · z∈[-98,-53]mm · 3 of 34 slices shown]
[im 3/34  bone]
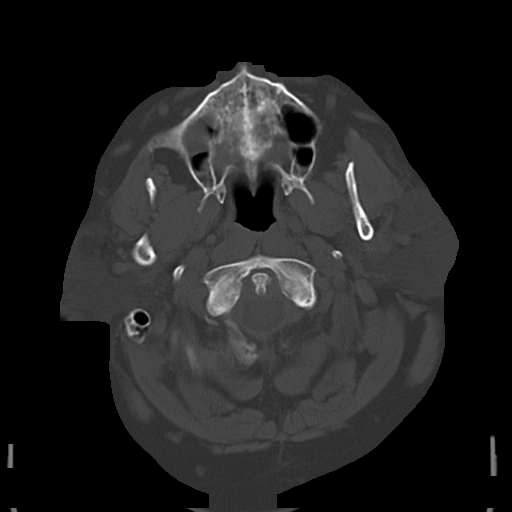
[im 8/34  bone]
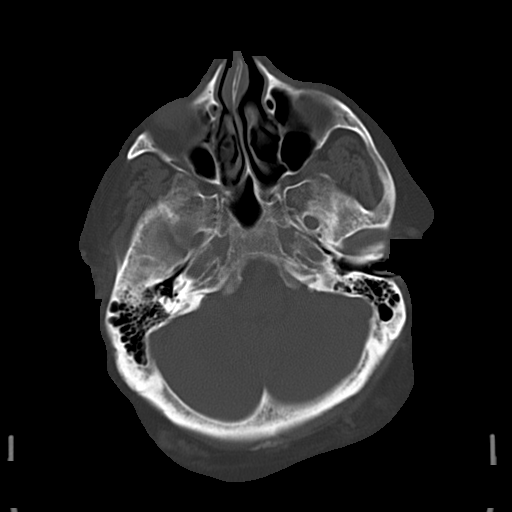
[im 12/34  bone]
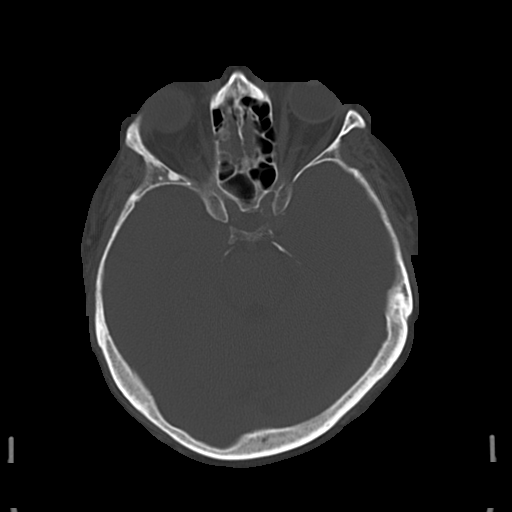

[16 of 30 positions shown; findings below may reference images not displayed]

PROCEDURE:     CT  - CT HEAD WITHOUT CONTRAST  - [DATE]  [DATE]

RESULT:     Noncontrast CT of the brain is performed. The patient has no
previous exam for comparison.

The ventricles and sulci are normal. There is no hemorrhage. There is no
focal mass, mass-effect or midline shift. There is no evidence of edema or
territorial infarct. The bone windows demonstrate normal aeration of the
paranasal sinuses and mastoid air cells except for some probable mucous
retention cysts in the maxillary sinuses.. There is no skull fracture
demonstrated.
IMPRESSION: 1. No acute intracranial abnormality.

[REDACTED]

## 2012-10-12 IMAGING — CR DG CHEST 2V
1 series · 2 of 2 positions shown · non-contrast
Comparison: none

REASON FOR EXAM: SOB
COMMENTS:   May transport without cardiac monitor

[Series 1: w chest pa · 0.14mm/px · 2 of 2 slices shown]
[im 1/2]
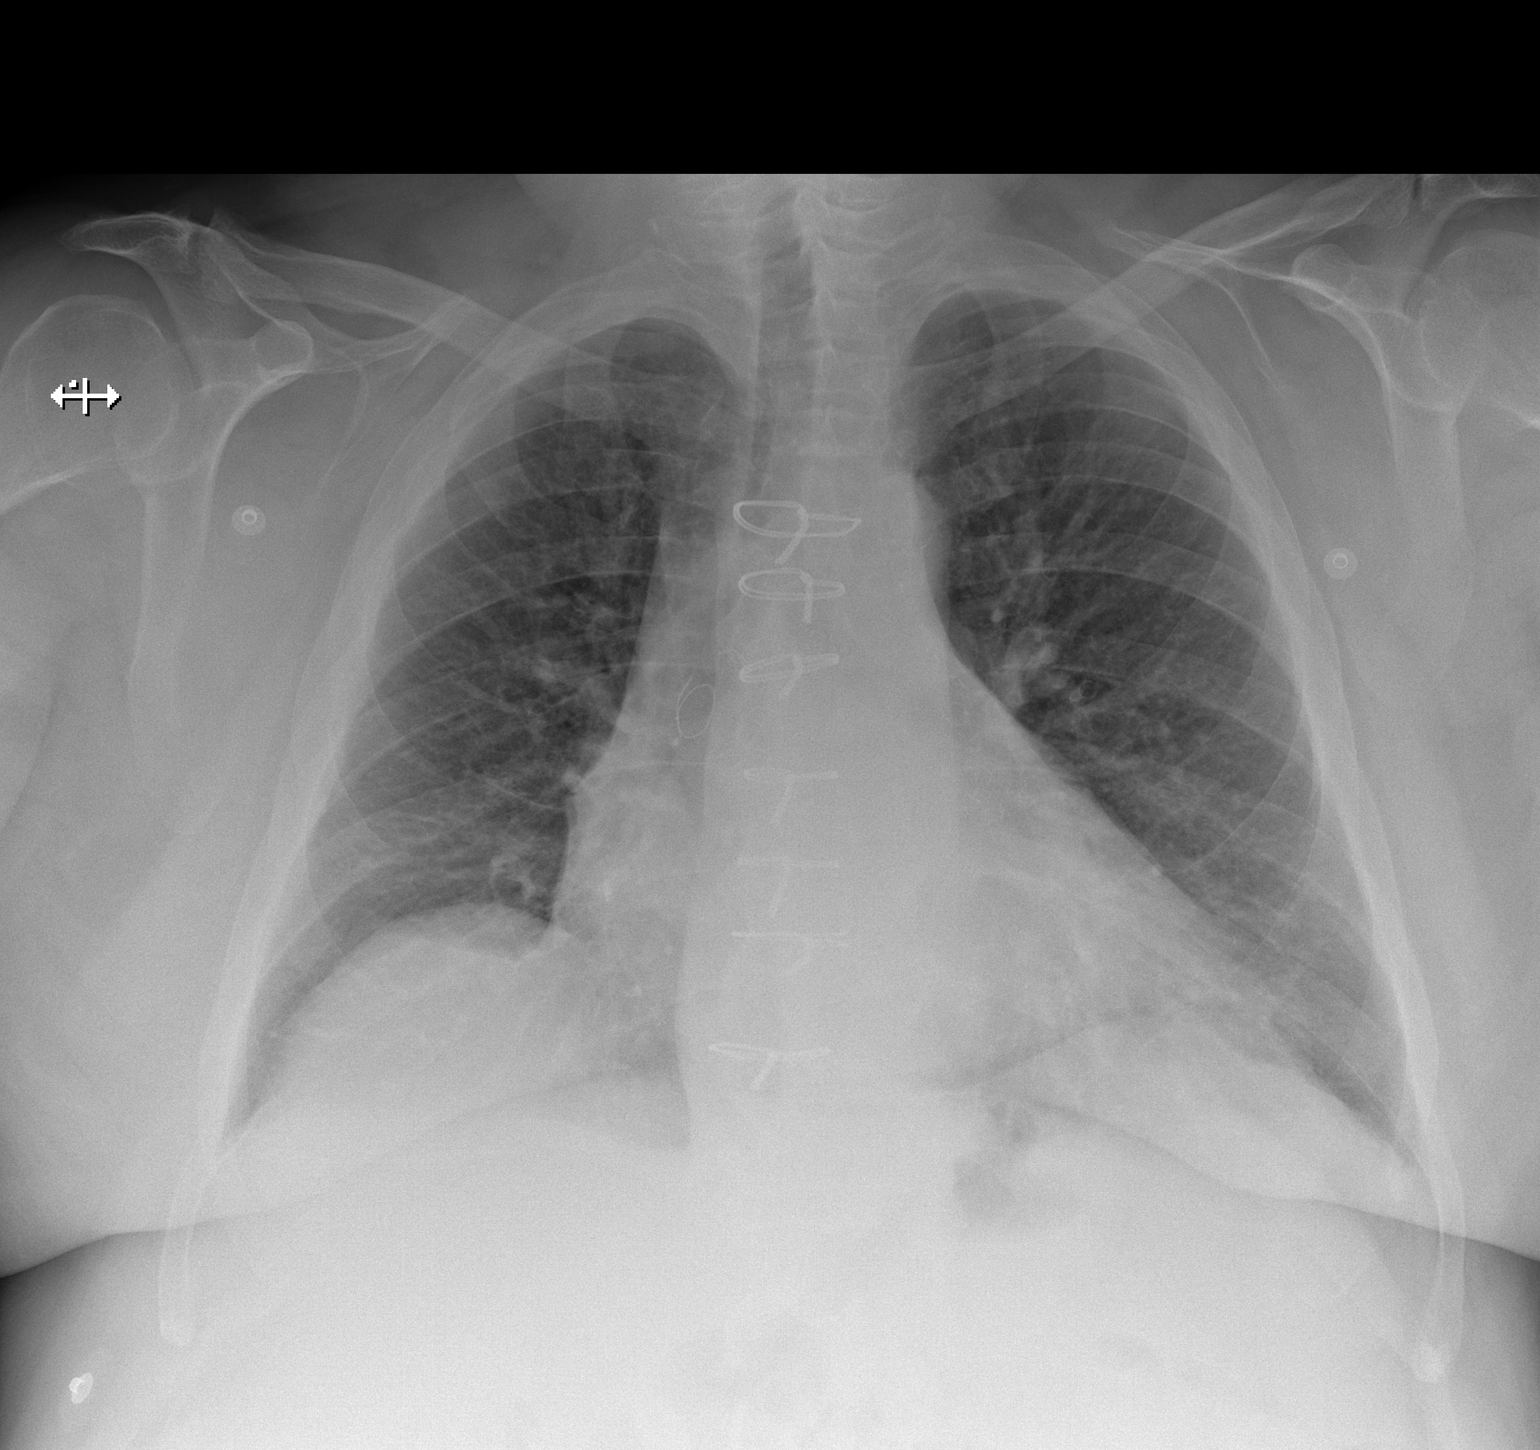
[im 2/2]
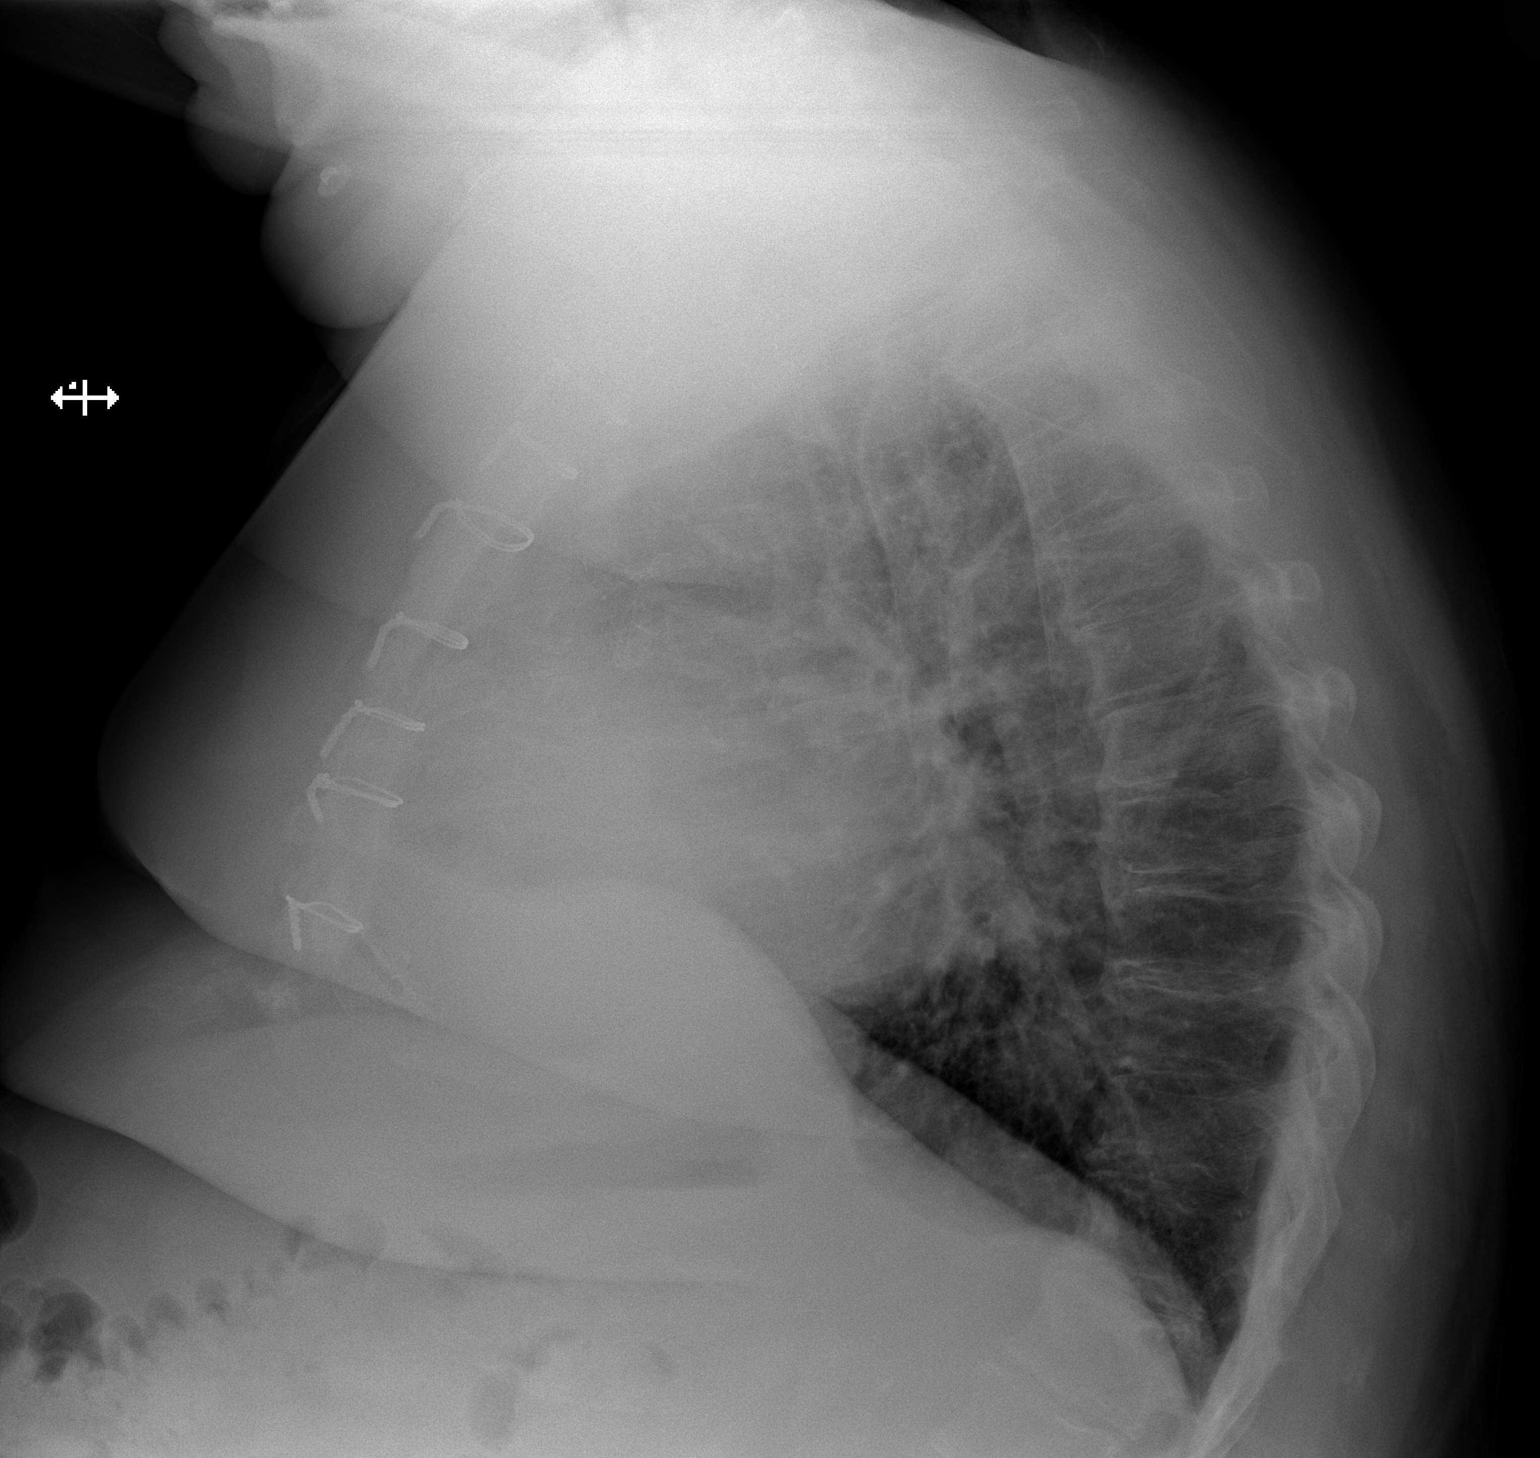

[2 of 2 positions shown; findings below may reference images not displayed]

PROCEDURE:     DXR - DXR CHEST PA (OR AP) AND LATERAL  - [DATE]  [DATE]

RESULT:     Comparison is made to the previous examination [DATE]. Sternotomy wires are present. The cardiac silhouette is normal. The
lungs appear clear. There is hyperinflation. Bony and mediastinal structures
are unremarkable. There is no edema, effusion, pneumothorax or mass.
IMPRESSION: 1. Mild hyperinflation. Borderline cardiomegaly. No acute abnormality.

[REDACTED]

## 2012-10-22 ENCOUNTER — Emergency Department: Payer: Self-pay | Admitting: Emergency Medicine

## 2012-10-22 LAB — BASIC METABOLIC PANEL
Anion Gap: 8 (ref 7–16)
BUN: 18 mg/dL (ref 7–18)
Calcium, Total: 8.6 mg/dL (ref 8.5–10.1)
Chloride: 108 mmol/L — ABNORMAL HIGH (ref 98–107)
Co2: 23 mmol/L (ref 21–32)
Creatinine: 0.81 mg/dL (ref 0.60–1.30)
EGFR (African American): >60
EGFR (Non-African Amer.): >60
Glucose: 127 mg/dL — ABNORMAL HIGH (ref 65–99)
Osmolality: 281 (ref 275–301)
Potassium: 4.1 mmol/L (ref 3.5–5.1)
Sodium: 139 mmol/L (ref 136–145)

## 2012-10-22 LAB — CK TOTAL AND CKMB (NOT AT ARMC)
CK, Total: 326 U/L — ABNORMAL HIGH (ref 35–232)
CK-MB: 2.9 ng/mL (ref 0.5–3.6)

## 2012-10-22 LAB — CBC
HCT: 43.7 % (ref 40.0–52.0)
HGB: 15.2 g/dL (ref 13.0–18.0)
MCH: 30.6 pg (ref 26.0–34.0)
MCHC: 34.7 g/dL (ref 32.0–36.0)
MCV: 88 fL (ref 80–100)
Platelet: 204 10*3/uL (ref 150–440)
RBC: 4.97 10*6/uL (ref 4.40–5.90)
RDW: 14.8 % — ABNORMAL HIGH (ref 11.5–14.5)
WBC: 10.2 10*3/uL (ref 3.8–10.6)

## 2012-10-22 LAB — TROPONIN I
Troponin-I: <0.02 ng/mL
Troponin-I: <0.02 ng/mL

## 2012-10-22 IMAGING — US ABDOMEN ULTRASOUND LIMITED
1 series · 14 of 25 positions shown · non-contrast
Comparison: none

REASON FOR EXAM: RUQ pain, vomiting
COMMENTS:   Body Site: GB and Fossa, CBD, Head of Pancreas

PROCEDURE:     US  - US ABDOMEN LIMITED SURVEY  - [DATE]  [DATE]
RESULT:     History: Pain.
Comparison Study: CT abdomen [DATE].

[Series 1: abdomen ultrasound limited · 0.32mm/px · 14 of 46 slices shown]
[im 1/46]
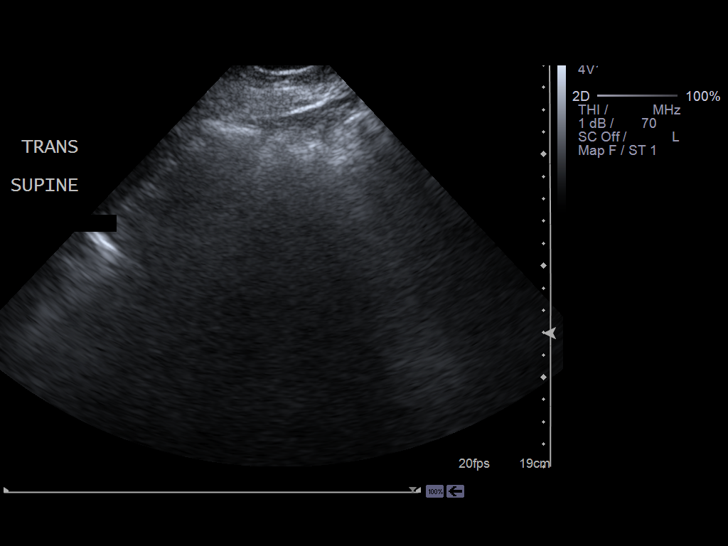
[im 4/46]
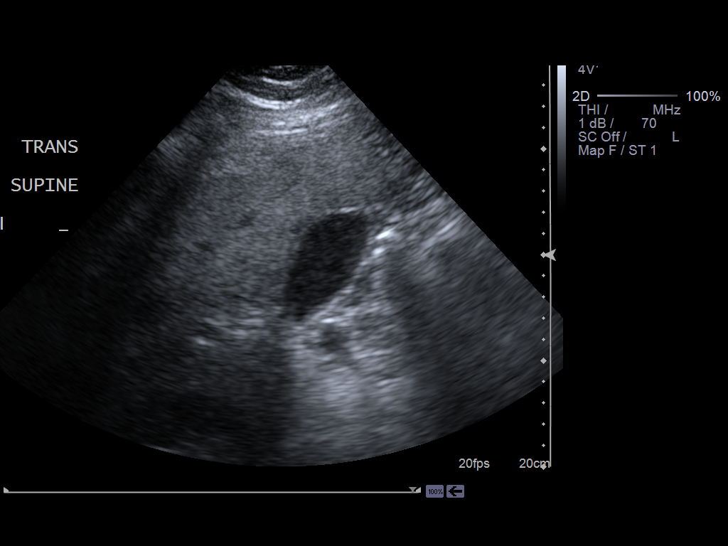
[im 8/46]
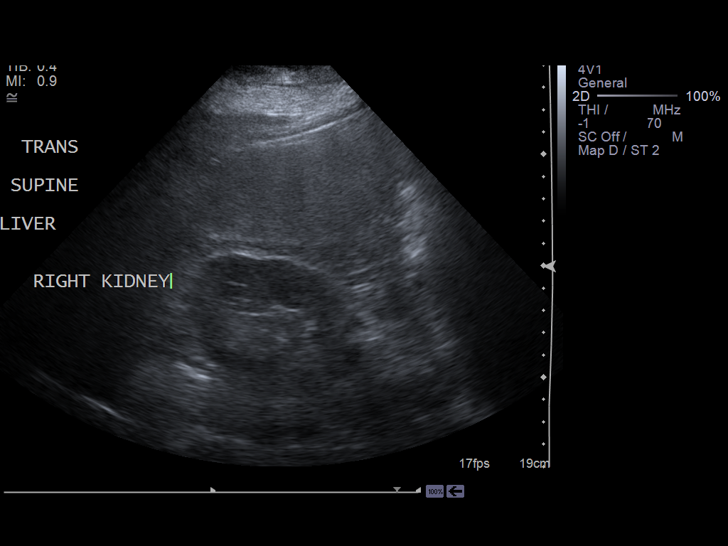
[im 12/46]
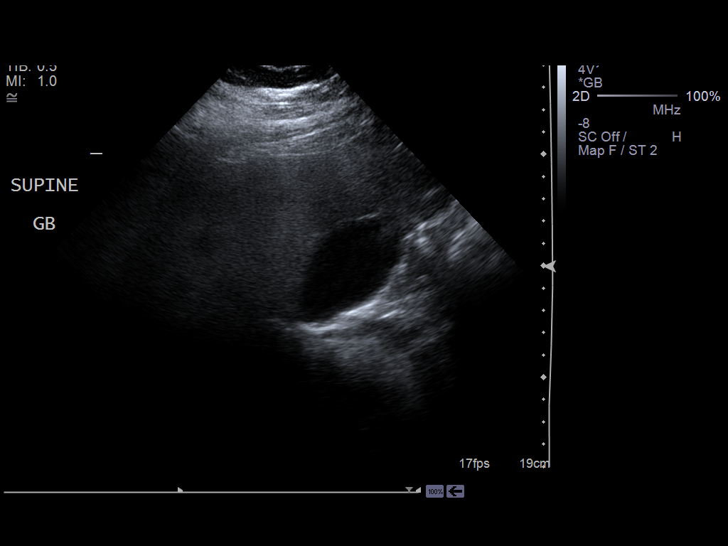
[im 16/46]
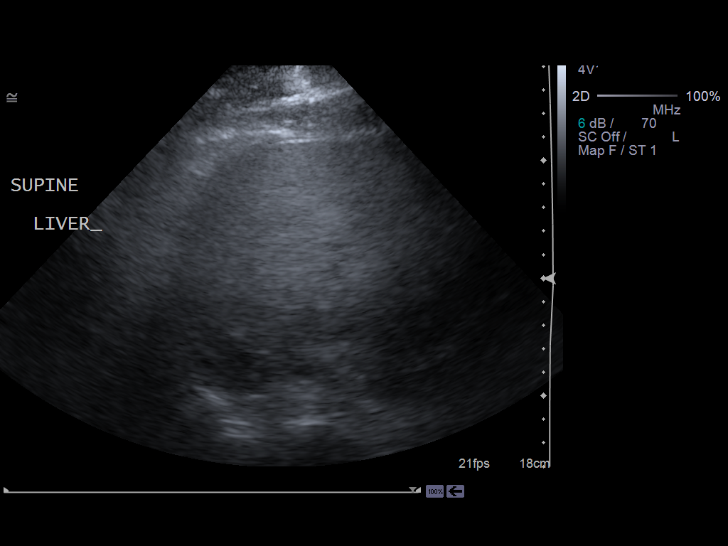
[im 17/46]
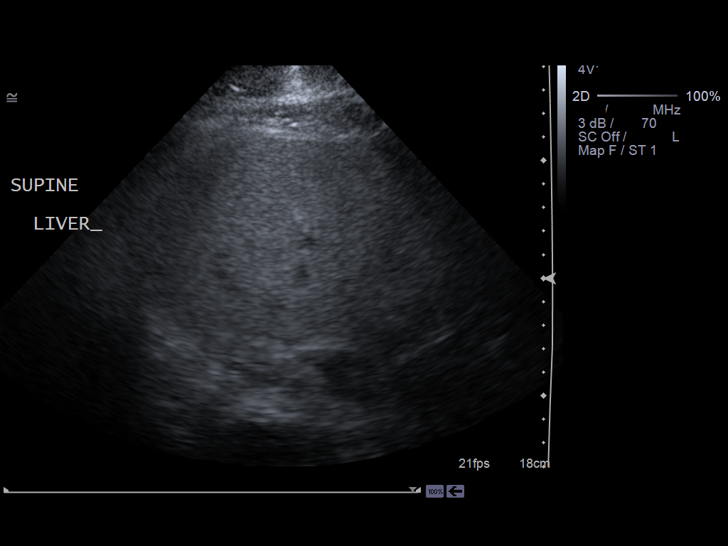
[im 21/46]
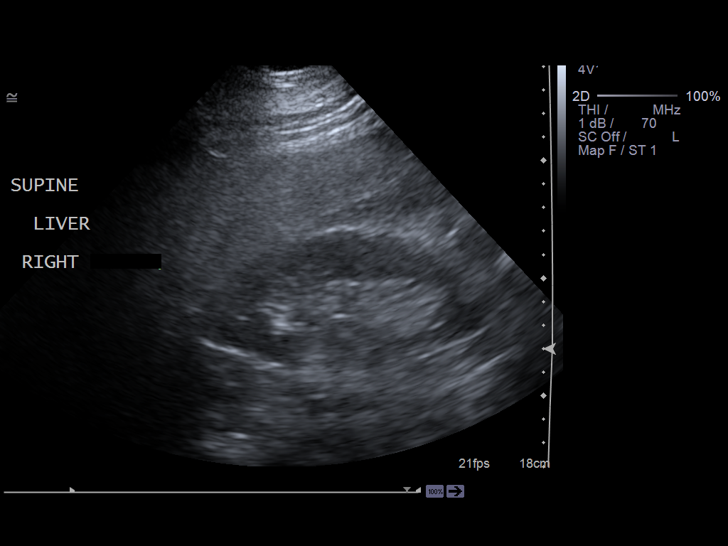
[im 25/46]
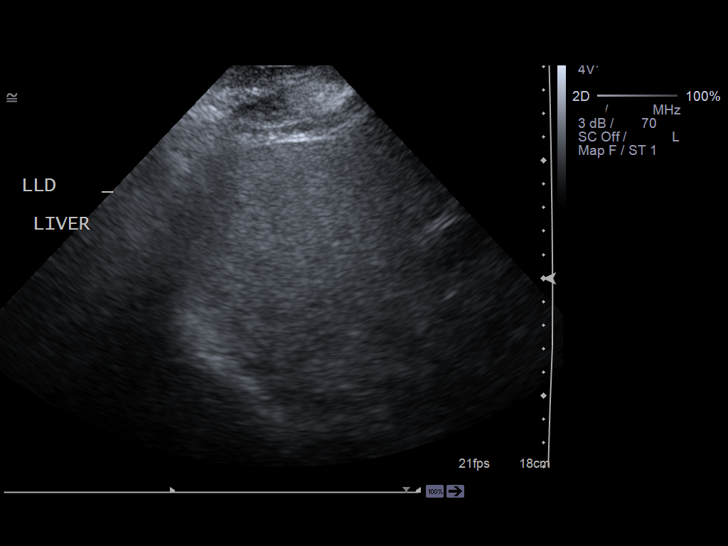
[im 29/46]
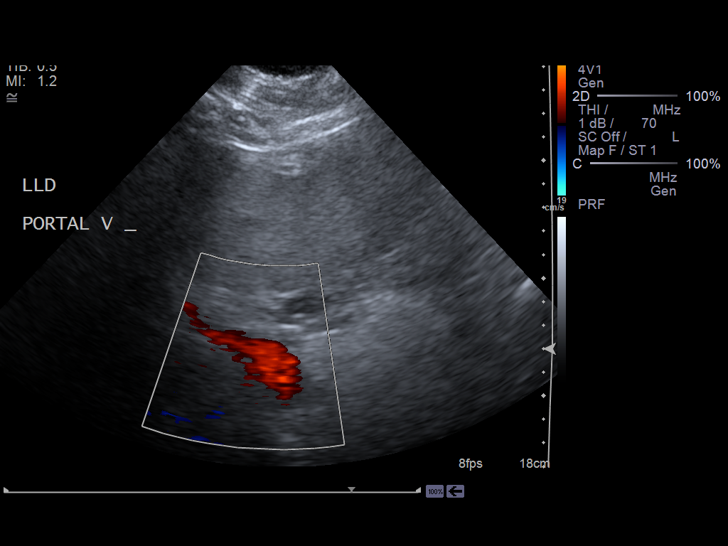
[im 31/46]
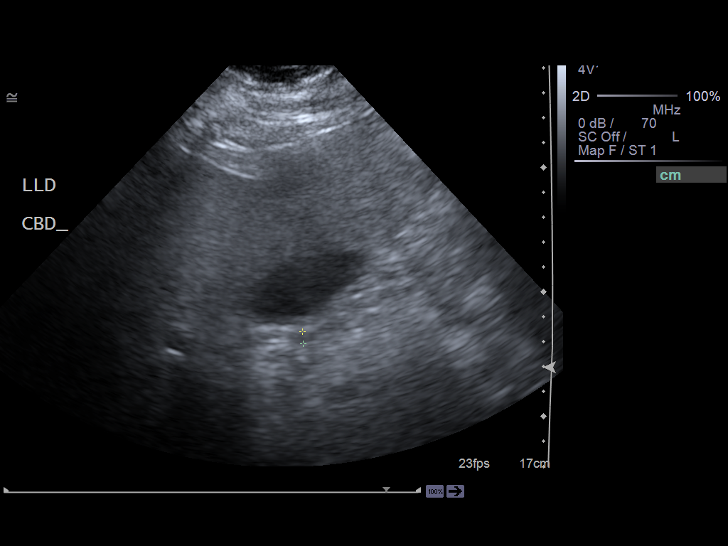
[im 34/46]
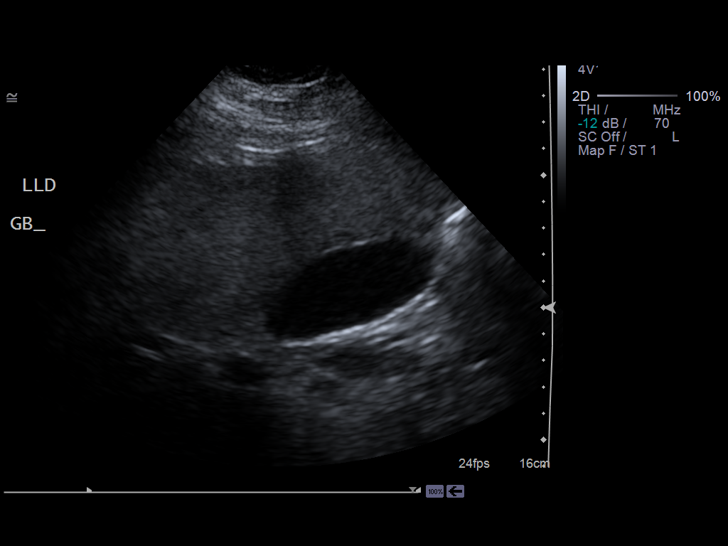
[im 38/46]
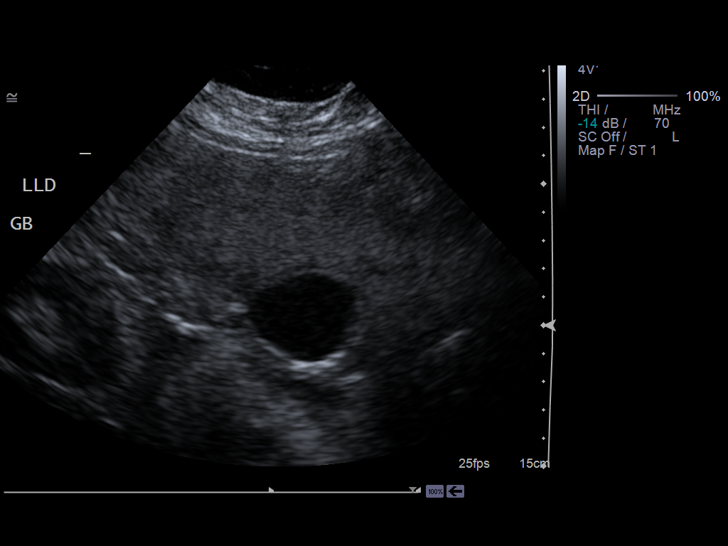
[im 42/46]
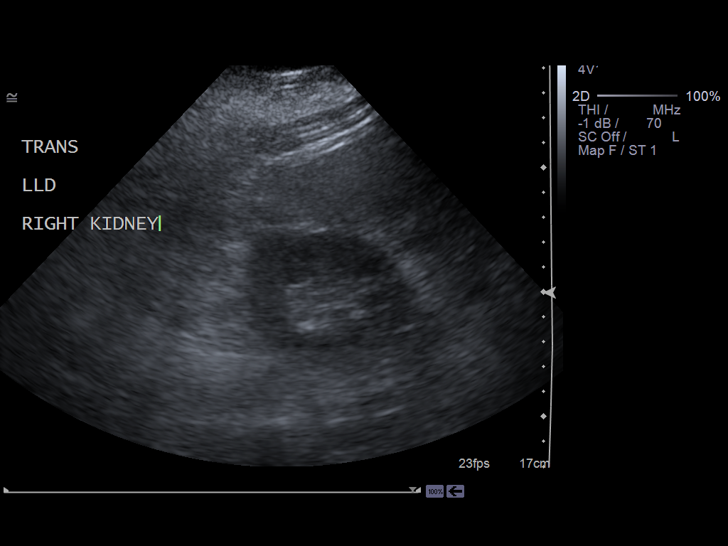
[im 46/46]
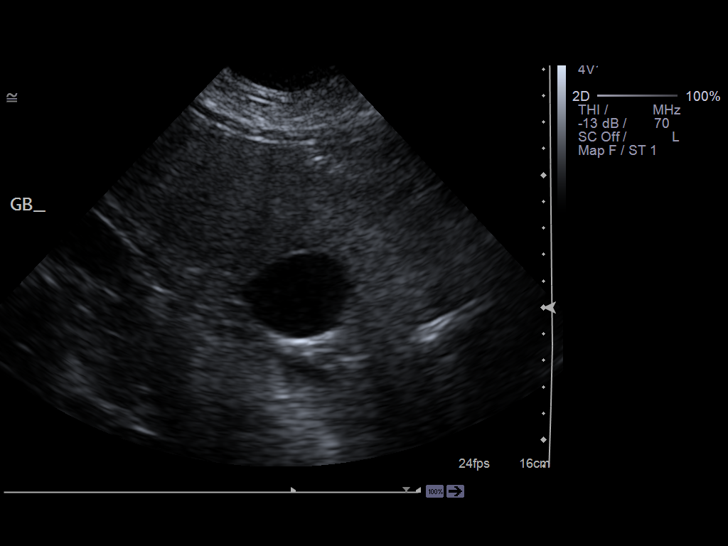

[14 of 25 positions shown; findings below may reference images not displayed]

FINDINGS: Standard right quadrant ultrasound performed. Fatty infiltration
liver is noted. Gallbladder normal. Common bile duct normal in caliber 5 mm.
Negative Murphy's sign. Pancreas not visualized.
IMPRESSION: No acute abnormality.

## 2012-11-02 ENCOUNTER — Emergency Department: Payer: Self-pay | Admitting: Emergency Medicine

## 2012-11-02 LAB — COMPREHENSIVE METABOLIC PANEL
Albumin: 3.7 g/dL (ref 3.4–5.0)
Alkaline Phosphatase: 99 U/L (ref 50–136)
Anion Gap: 6 — ABNORMAL LOW (ref 7–16)
BUN: 13 mg/dL (ref 7–18)
Bilirubin,Total: 0.4 mg/dL (ref 0.2–1.0)
Calcium, Total: 8.6 mg/dL (ref 8.5–10.1)
Chloride: 106 mmol/L (ref 98–107)
Co2: 25 mmol/L (ref 21–32)
Creatinine: 0.77 mg/dL (ref 0.60–1.30)
EGFR (African American): >60
EGFR (Non-African Amer.): >60
Glucose: 126 mg/dL — ABNORMAL HIGH (ref 65–99)
Osmolality: 275 (ref 275–301)
Potassium: 4 mmol/L (ref 3.5–5.1)
SGOT(AST): 28 U/L (ref 15–37)
SGPT (ALT): 30 U/L (ref 12–78)
Sodium: 137 mmol/L (ref 136–145)
Total Protein: 7.5 g/dL (ref 6.4–8.2)

## 2012-11-02 LAB — URINALYSIS, COMPLETE
Bacteria: NONE SEEN
Bilirubin,UR: NEGATIVE
Blood: NEGATIVE
Glucose,UR: NEGATIVE mg/dL (ref 0–75)
Ketone: NEGATIVE
Leukocyte Esterase: NEGATIVE
Nitrite: NEGATIVE
Ph: 6 (ref 4.5–8.0)
Protein: NEGATIVE
RBC,UR: NONE SEEN /HPF (ref 0–5)
Specific Gravity: 1.011 (ref 1.003–1.030)
Squamous Epithelial: <1
WBC UR: <1 /HPF (ref 0–5)

## 2012-11-02 LAB — CBC
HCT: 42.3 % (ref 40.0–52.0)
HGB: 13.9 g/dL (ref 13.0–18.0)
MCH: 28.7 pg (ref 26.0–34.0)
MCHC: 32.9 g/dL (ref 32.0–36.0)
MCV: 87 fL (ref 80–100)
Platelet: 211 10*3/uL (ref 150–440)
RBC: 4.84 10*6/uL (ref 4.40–5.90)
RDW: 15.4 % — ABNORMAL HIGH (ref 11.5–14.5)
WBC: 10.6 10*3/uL (ref 3.8–10.6)

## 2012-11-02 LAB — CK TOTAL AND CKMB (NOT AT ARMC)
CK, Total: 286 U/L — ABNORMAL HIGH (ref 35–232)
CK-MB: 2.2 ng/mL (ref 0.5–3.6)

## 2012-11-02 LAB — TROPONIN I: Troponin-I: <0.02 ng/mL

## 2012-11-02 IMAGING — CR DG CHEST 1V PORT
1 series · 1 of 1 positions shown · non-contrast
Comparison: none

REASON FOR EXAM: Chest pain
COMMENTS:

PROCEDURE:     DXR - DXR PORTABLE CHEST SINGLE VIEW  - [DATE]  [DATE]
RESULT:     The lungs are clear. The cardiac silhouette and visualized bony
skeleton are unremarkable.

[ap]
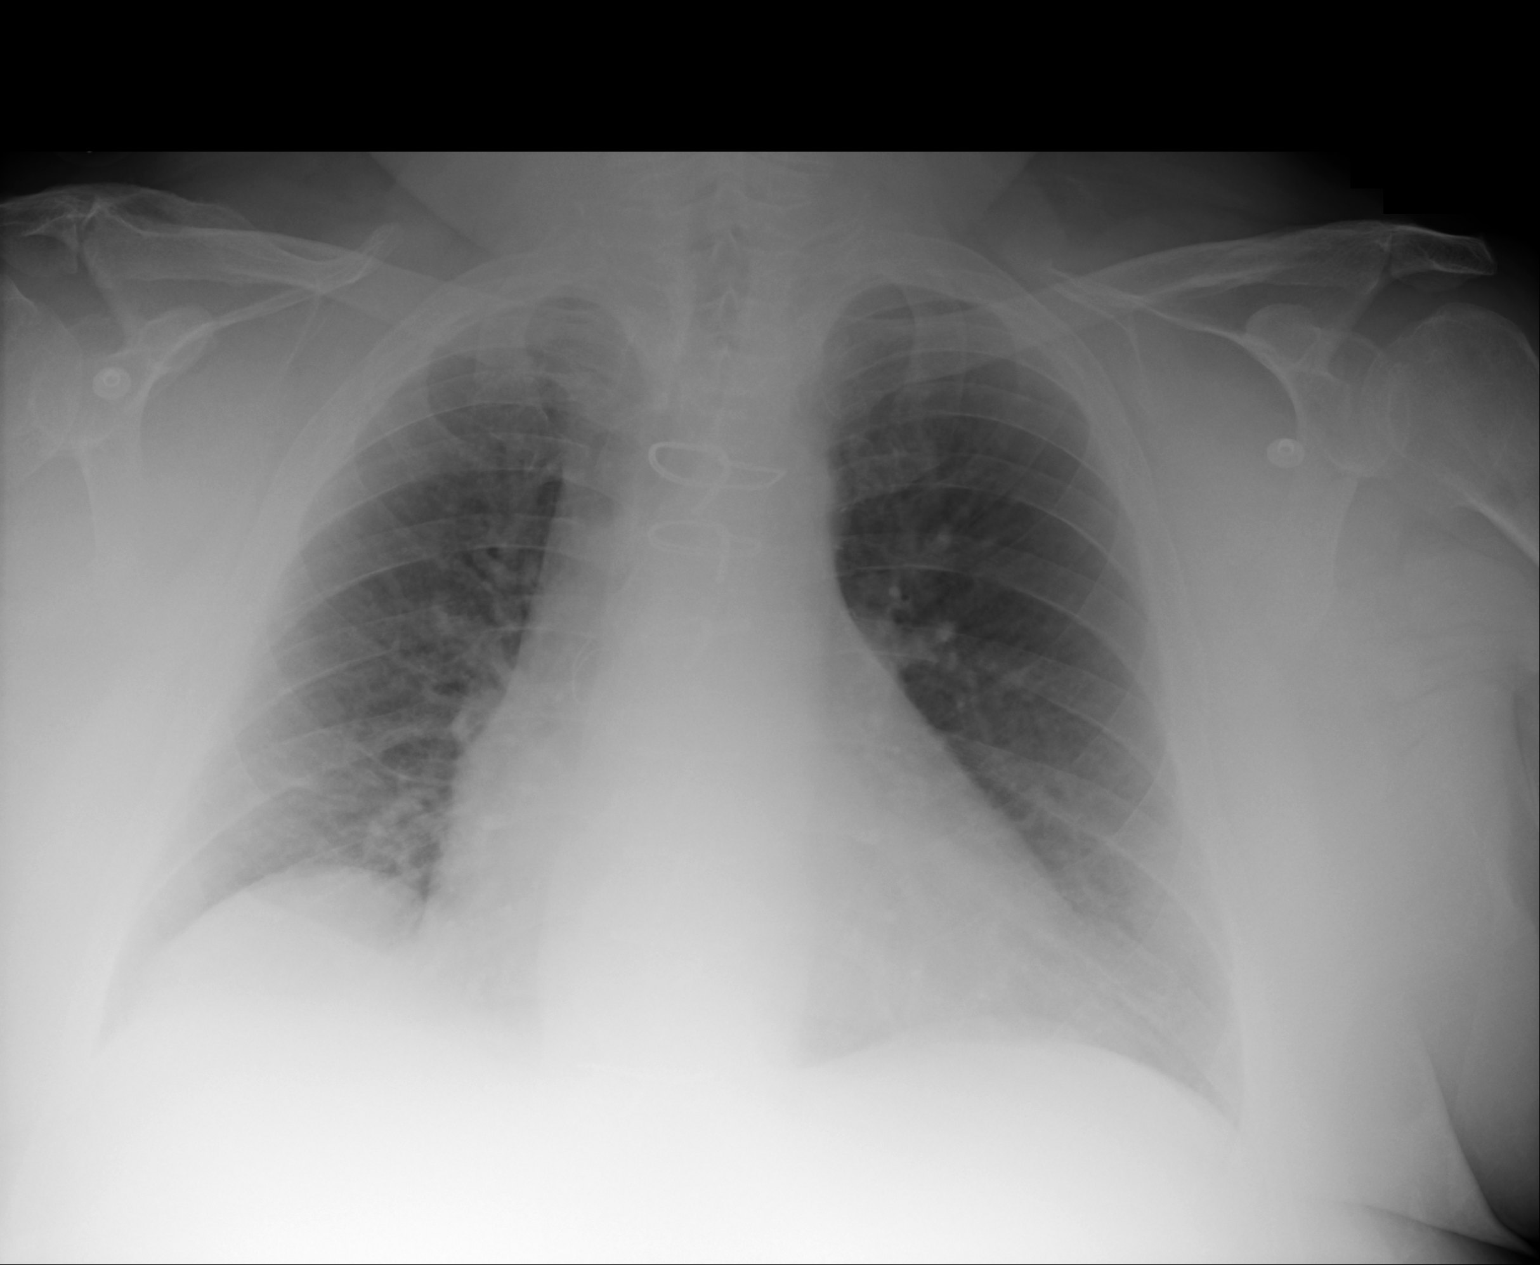

[1 of 1 positions shown; findings below may reference images not displayed]

IMPRESSION: 1. Chest radiograph without evidence of acute cardiopulmonary disease.

## 2012-12-04 ENCOUNTER — Ambulatory Visit: Payer: Self-pay | Admitting: Ophthalmology

## 2012-12-08 ENCOUNTER — Emergency Department: Payer: Self-pay | Admitting: Emergency Medicine

## 2012-12-08 ENCOUNTER — Telehealth: Payer: Self-pay | Admitting: Internal Medicine

## 2012-12-08 NOTE — Telephone Encounter (Signed)
Patient called regarding infection/abscess forming midsternum around CABG wound.  Patient also reports string like material outside of area.  No bleeding or discharge noted.  Patient was very concerned and plans to go to local ER to be evaluated.

## 2012-12-14 ENCOUNTER — Emergency Department: Payer: Self-pay | Admitting: Emergency Medicine

## 2013-01-02 ENCOUNTER — Encounter: Payer: Self-pay | Admitting: *Deleted

## 2013-01-02 ENCOUNTER — Telehealth: Payer: Self-pay | Admitting: *Deleted

## 2013-01-02 NOTE — Telephone Encounter (Signed)
I spoke with pt and he now has insurance with [employer]. He would like his prescriptions called in to [facility]. [phone])  I have called in his metoprolol tartrate, zantac, and simvastatin

## 2013-01-02 NOTE — Telephone Encounter (Signed)
Patient called needing to have rx for Meto Tart 25mg  bid, Zantac 150 bid, and Simvas 80mg  1/2 in pm to [facility]. When called the number the patient provided was unable to reach anyone about his medications. Will forward this to the nurse. Maybe she will have better luck reaching [facility] about patient medications.  [phone], ID# [id number]    Micki Riley, CMA

## 2013-02-24 ENCOUNTER — Observation Stay: Payer: Self-pay | Admitting: Family Medicine

## 2013-02-24 LAB — CBC
HCT: 41 % (ref 40.0–52.0)
HGB: 13.5 g/dL (ref 13.0–18.0)
MCH: 28.7 pg (ref 26.0–34.0)
MCHC: 32.9 g/dL (ref 32.0–36.0)
MCV: 87 fL (ref 80–100)
Platelet: 241 10*3/uL (ref 150–440)
RBC: 4.7 10*6/uL (ref 4.40–5.90)
RDW: 15.1 % — ABNORMAL HIGH (ref 11.5–14.5)
WBC: 11.5 10*3/uL — ABNORMAL HIGH (ref 3.8–10.6)

## 2013-02-24 LAB — BASIC METABOLIC PANEL
Anion Gap: 5 — ABNORMAL LOW (ref 7–16)
BUN: 16 mg/dL (ref 7–18)
Calcium, Total: 9.2 mg/dL (ref 8.5–10.1)
Chloride: 106 mmol/L (ref 98–107)
Co2: 27 mmol/L (ref 21–32)
Creatinine: 0.86 mg/dL (ref 0.60–1.30)
EGFR (African American): >60
EGFR (Non-African Amer.): >60
Glucose: 130 mg/dL — ABNORMAL HIGH (ref 65–99)
Osmolality: 279 (ref 275–301)
Potassium: 4 mmol/L (ref 3.5–5.1)
Sodium: 138 mmol/L (ref 136–145)

## 2013-02-24 LAB — TROPONIN I
Troponin-I: 0.02 ng/mL
Troponin-I: 0.02 ng/mL
Troponin-I: <0.02 ng/mL

## 2013-02-24 LAB — CK TOTAL AND CKMB (NOT AT ARMC)
CK, Total: 327 U/L — ABNORMAL HIGH (ref 35–232)
CK-MB: 3.1 ng/mL (ref 0.5–3.6)

## 2013-02-24 LAB — PRO B NATRIURETIC PEPTIDE: B-Type Natriuretic Peptide: 602 pg/mL — ABNORMAL HIGH (ref 0–125)

## 2013-02-24 IMAGING — CR DG CHEST 2V
1 series · 2 of 2 positions shown · non-contrast
Comparison: none

REASON FOR EXAM: SOB
COMMENTS:

[Series 2: w chest pa · 0.14mm/px · 2 of 2 slices shown]
[im 1/2]
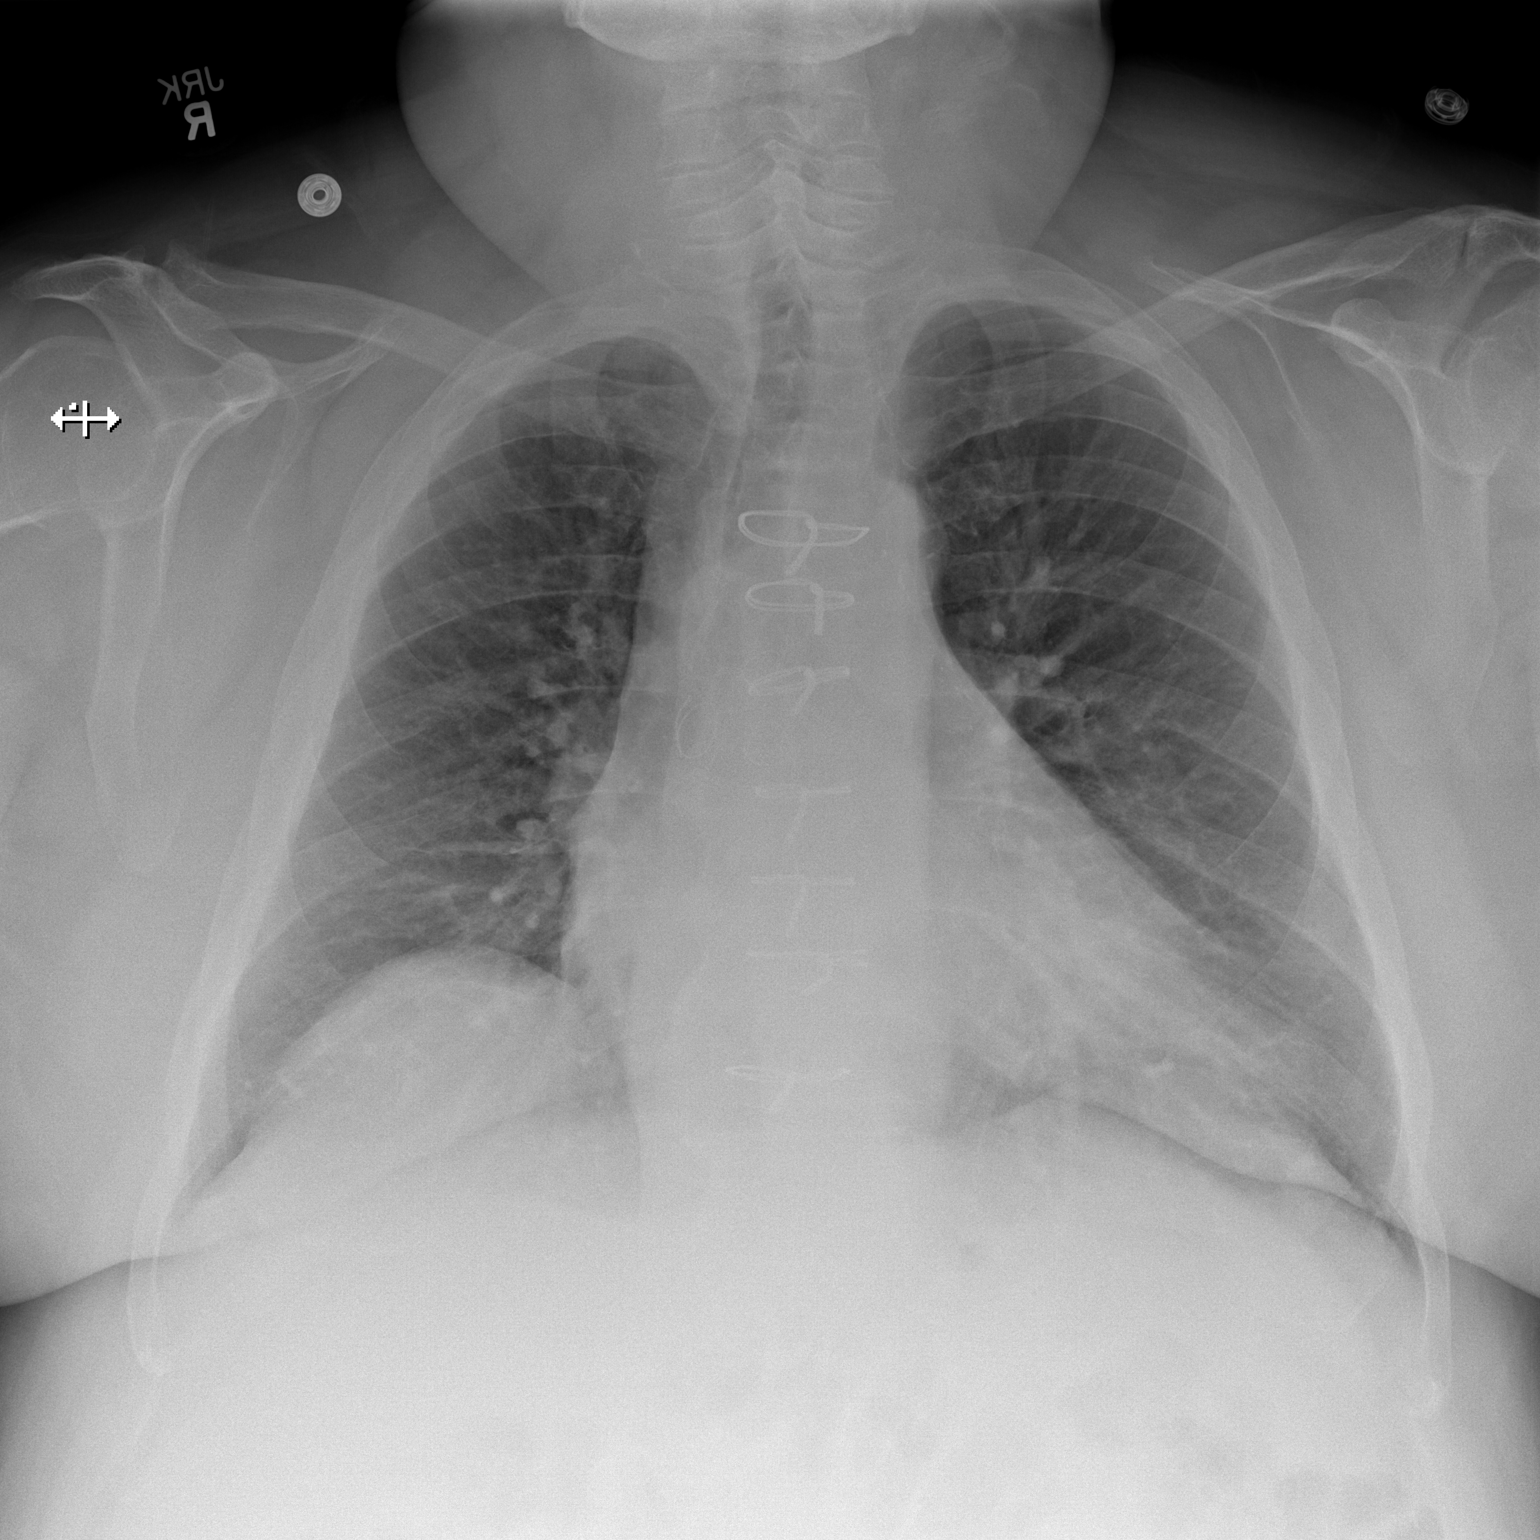
[im 2/2]
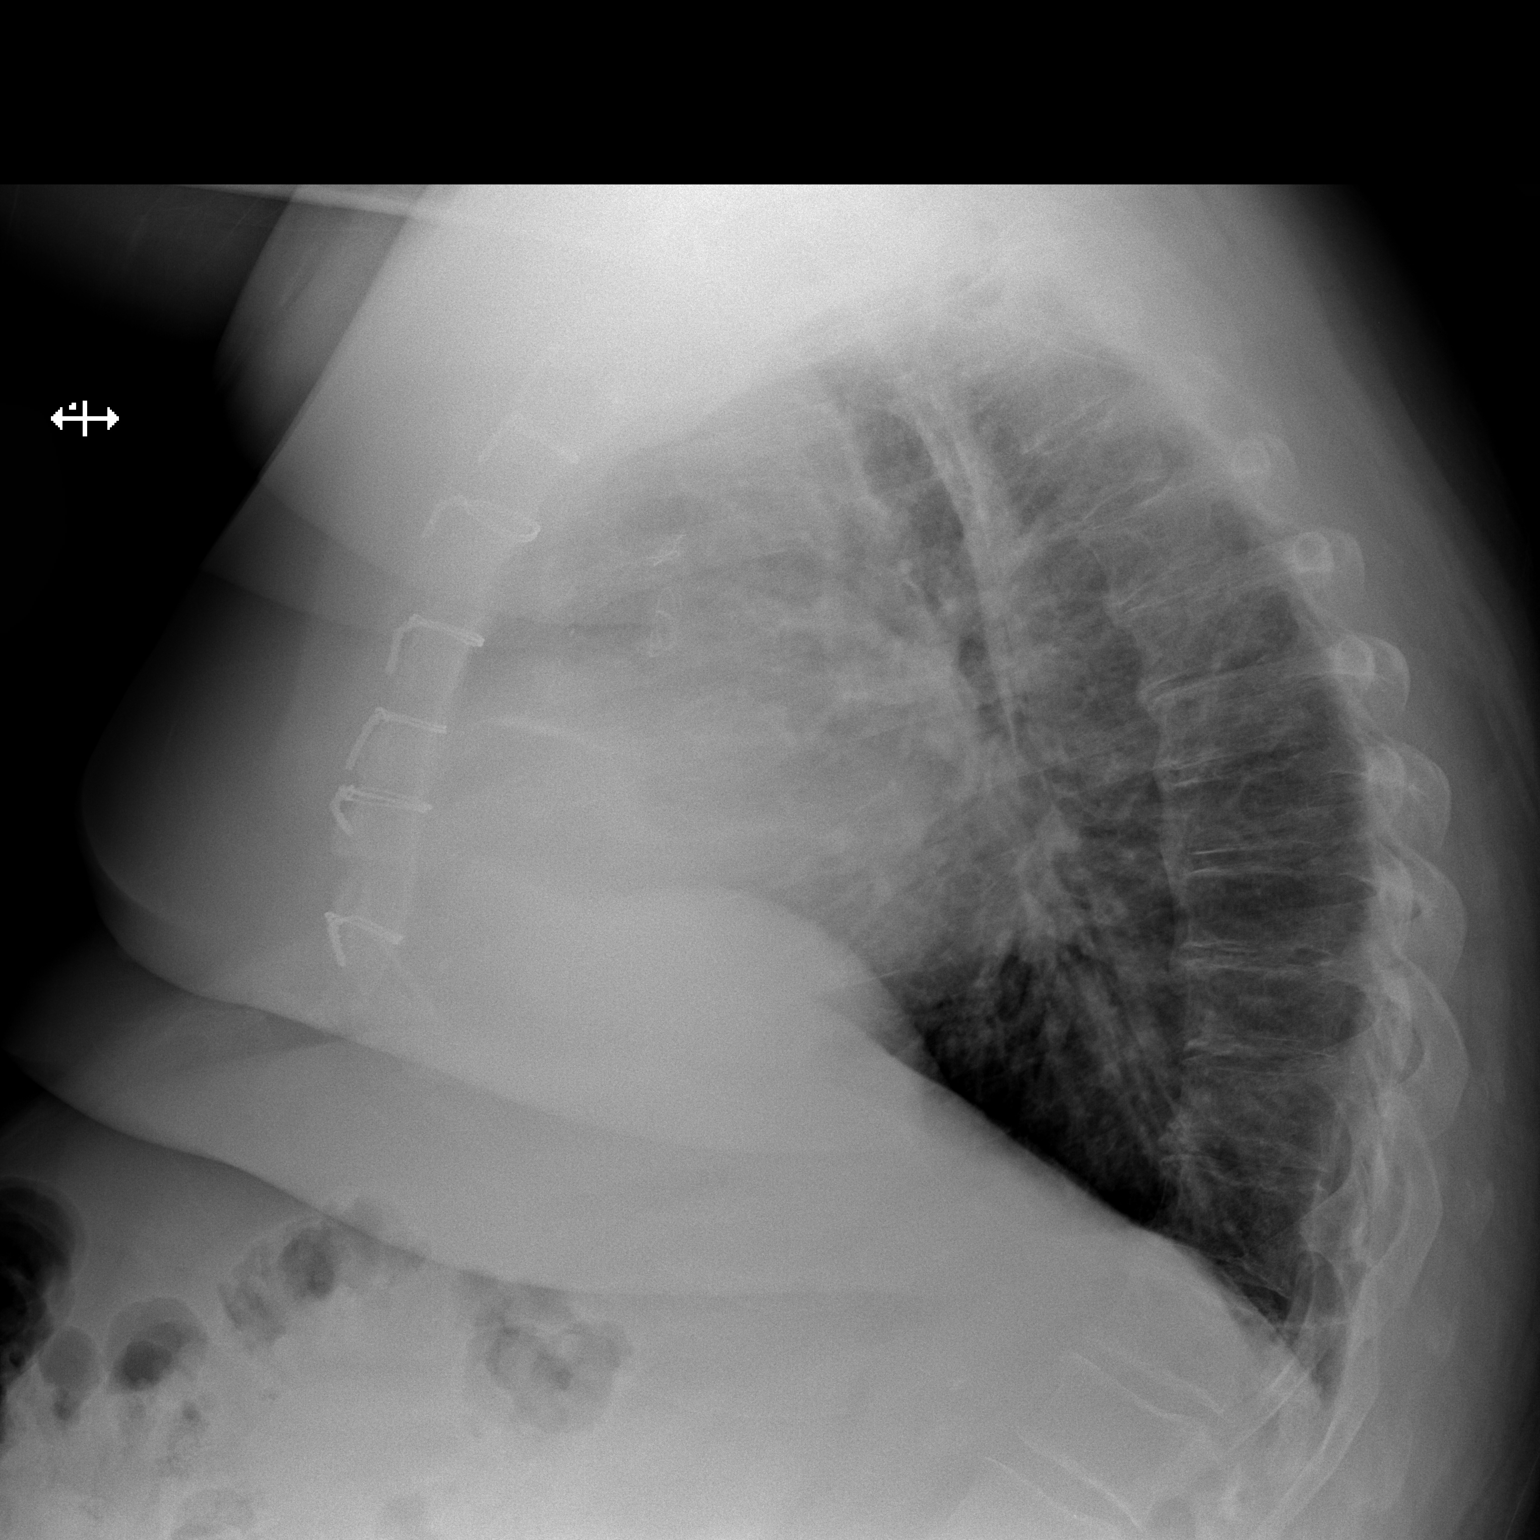

[2 of 2 positions shown; findings below may reference images not displayed]

PROCEDURE:     DXR - DXR CHEST PA (OR AP) AND LATERAL  - [DATE]  [DATE]

RESULT:     Comparison is made to the study [DATE].

The lungs are adequately inflated. The interstitial markings are increased.
The cardiac silhouette is enlarged. The pulmonary vascularity is prominent
centrally. The trachea is midline. There is no pleural effusion. On the
lateral film there is linear density in the retrosternal region which likely
lies on the right and may reflect scarring or atelectasis. No acute
abnormality the visualized portions of the ribs nor of the thoracic spine is
demonstrated.
IMPRESSION: 1. There are increased interstitial markings of both lungs. This is not
entirely new but is slightly more conspicuous than in [DATE]. There
is no pleural effusion or pneumothorax or alveolar infiltrate.
2. There is mild enlargement of the cardiac silhouette and prominence of the
central pulmonary vascularity. These findings are not new but may reflect
low-grade CHF.
3. I cannot exclude subsegmental atelectasis in the right mid to lower lung
anteriorly.

[REDACTED]

## 2013-03-08 ENCOUNTER — Emergency Department: Payer: Self-pay | Admitting: Emergency Medicine

## 2013-03-08 LAB — CBC
HCT: 40.9 % (ref 40.0–52.0)
HGB: 14 g/dL (ref 13.0–18.0)
MCH: 29.7 pg (ref 26.0–34.0)
MCHC: 34.2 g/dL (ref 32.0–36.0)
MCV: 87 fL (ref 80–100)
Platelet: 219 10*3/uL (ref 150–440)
RBC: 4.7 10*6/uL (ref 4.40–5.90)
RDW: 15 % — ABNORMAL HIGH (ref 11.5–14.5)
WBC: 11.6 10*3/uL — ABNORMAL HIGH (ref 3.8–10.6)

## 2013-03-08 LAB — BASIC METABOLIC PANEL
Anion Gap: 6 — ABNORMAL LOW (ref 7–16)
BUN: 15 mg/dL (ref 7–18)
Calcium, Total: 8.6 mg/dL (ref 8.5–10.1)
Chloride: 106 mmol/L (ref 98–107)
Co2: 25 mmol/L (ref 21–32)
Creatinine: 0.95 mg/dL (ref 0.60–1.30)
EGFR (African American): >60
EGFR (Non-African Amer.): >60
Glucose: 120 mg/dL — ABNORMAL HIGH (ref 65–99)
Osmolality: 276 (ref 275–301)
Potassium: 4 mmol/L (ref 3.5–5.1)
Sodium: 137 mmol/L (ref 136–145)

## 2013-03-08 IMAGING — CR DG CHEST 2V
1 series · 2 of 2 positions shown · non-contrast
Comparison: none

REASON FOR EXAM: SOB
COMMENTS:

PROCEDURE:     DXR - DXR CHEST PA (OR AP) AND LATERAL  - [DATE] [DATE]
RESULT:
Comparison is made to a prior study dated [DATE].

[Series 4: w chest pa · 0.14mm/px · 2 of 2 slices shown]
[im 1/2]
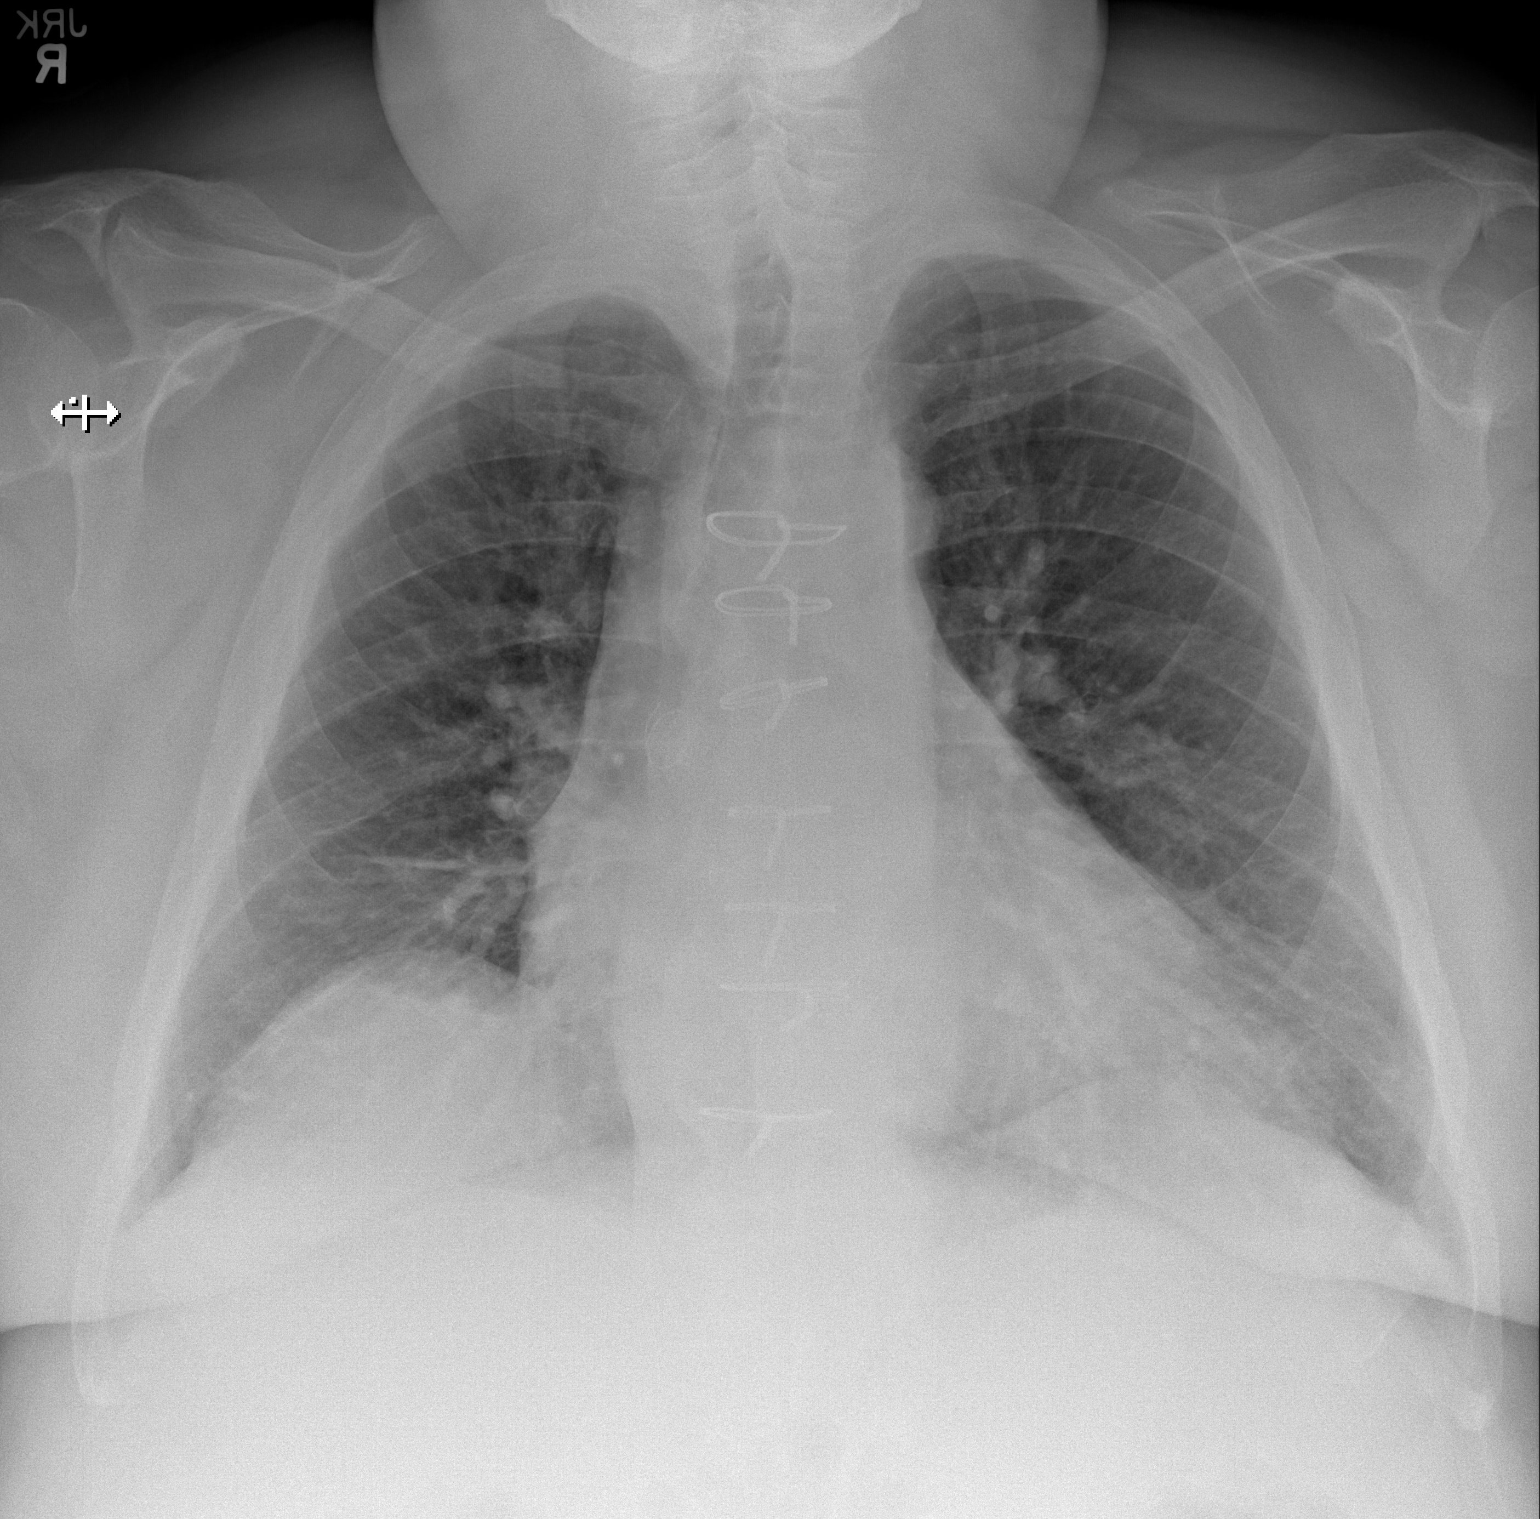
[im 2/2]
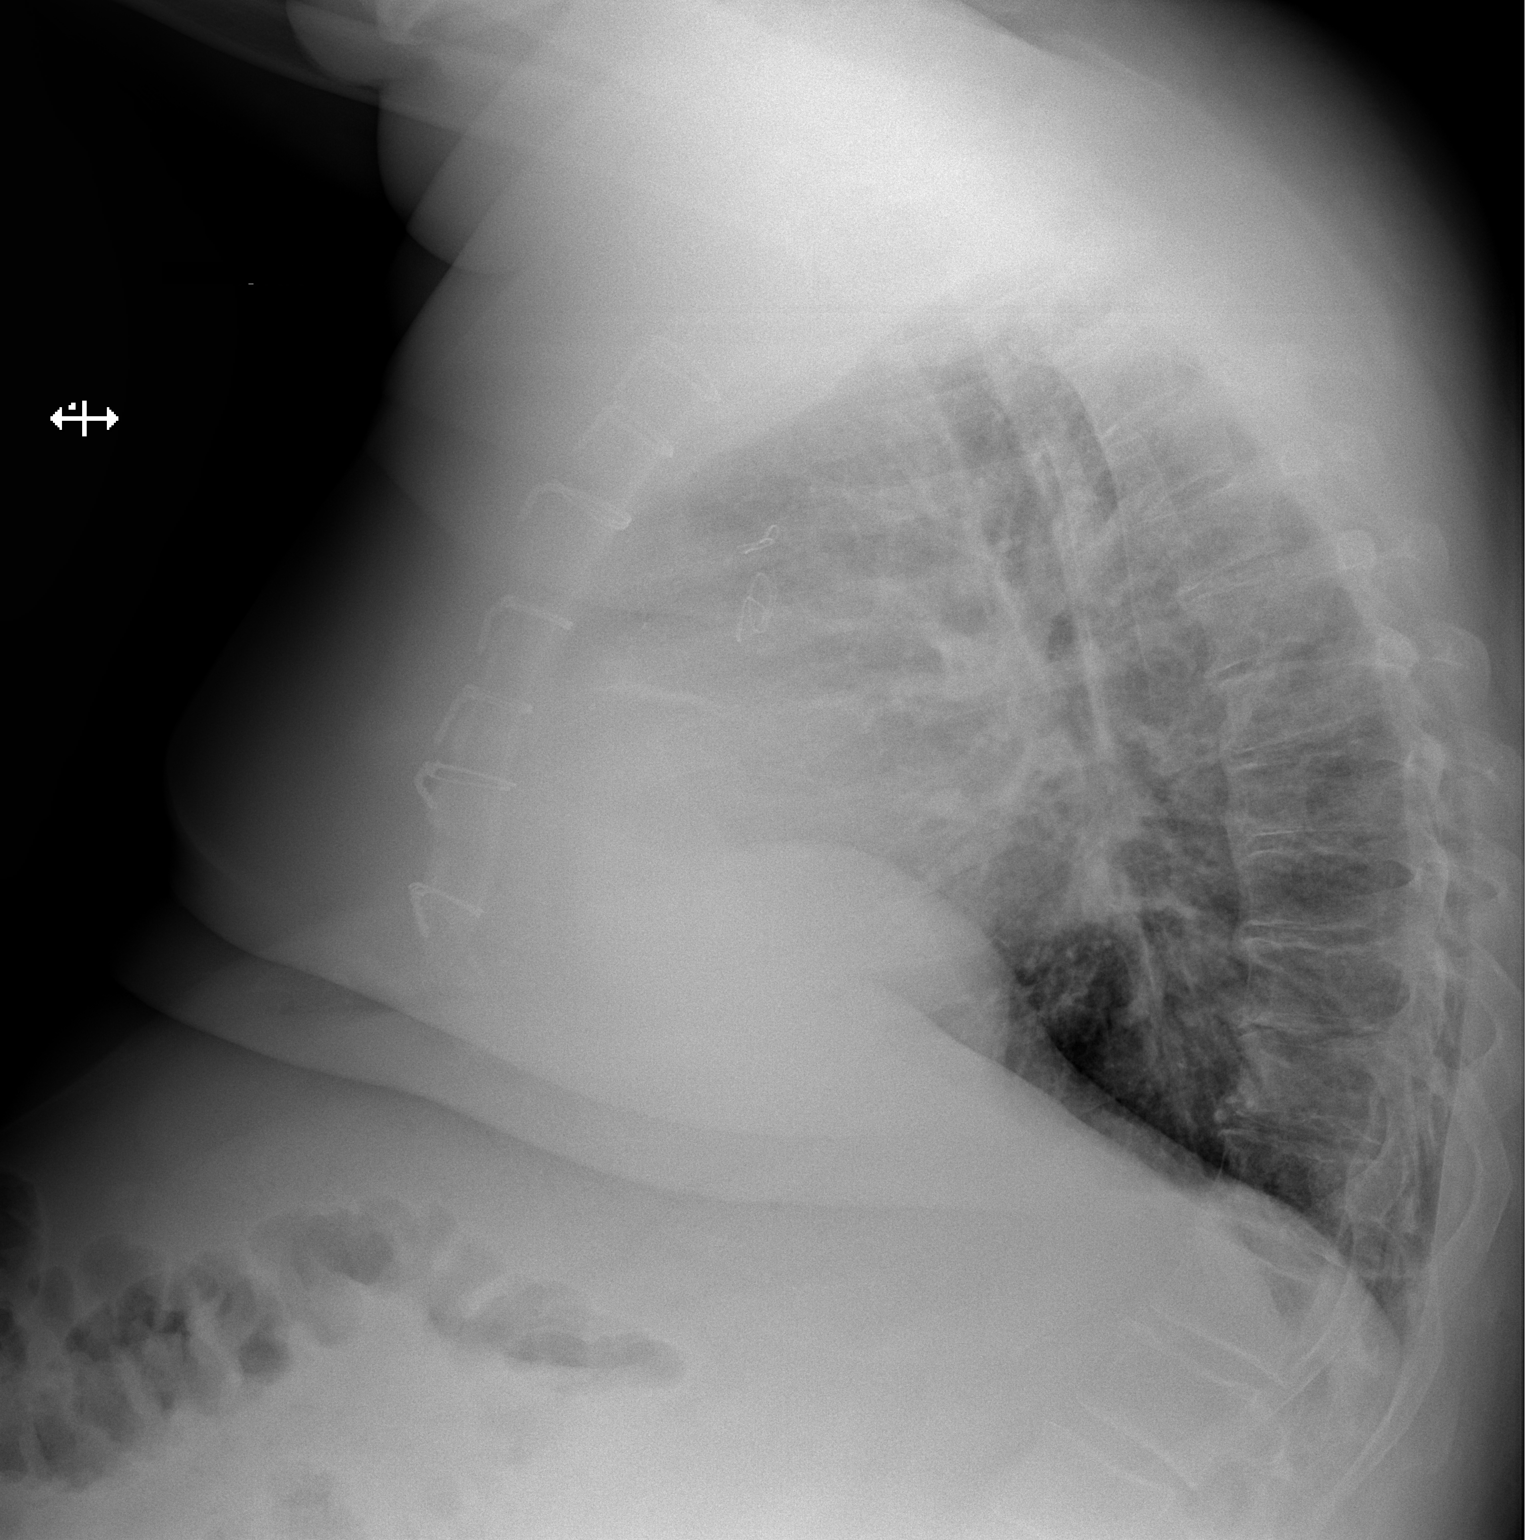

[2 of 2 positions shown; findings below may reference images not displayed]

FINDINGS: The patient has taken a shallow inspiration. With technique taken
into consideration, discoid atelectasis projects in the region of the right
middle lobe. No focal regions of consolidation identified. The cardiac
silhouette is moderately enlarged. The patient is status post median
sternotomy. The visualized bony skeleton is unremarkable.
IMPRESSION: Shallow inspiration without evidence of acute
cardiopulmonary disease.

## 2013-03-09 LAB — PRO B NATRIURETIC PEPTIDE: B-Type Natriuretic Peptide: 530 pg/mL — ABNORMAL HIGH (ref 0–125)

## 2013-03-09 LAB — CK TOTAL AND CKMB (NOT AT ARMC)
CK, Total: 317 U/L — ABNORMAL HIGH (ref 35–232)
CK-MB: 2.5 ng/mL (ref 0.5–3.6)

## 2013-03-09 LAB — TROPONIN I: Troponin-I: <0.02 ng/mL

## 2013-06-21 ENCOUNTER — Emergency Department: Payer: Self-pay | Admitting: Emergency Medicine

## 2013-06-22 IMAGING — CR DG CHEST 2V
1 series · 2 of 2 positions shown · non-contrast
Comparison: Chest radiograph performed [DATE]

CLINICAL DATA: Cough, congestion, fever and diarrhea. Wheezing and
dizziness. Weakness.

EXAM:
CHEST  2 VIEW

[Series 7: w chest pa · 0.14mm/px · 2 of 2 slices shown]
[im 1/2]
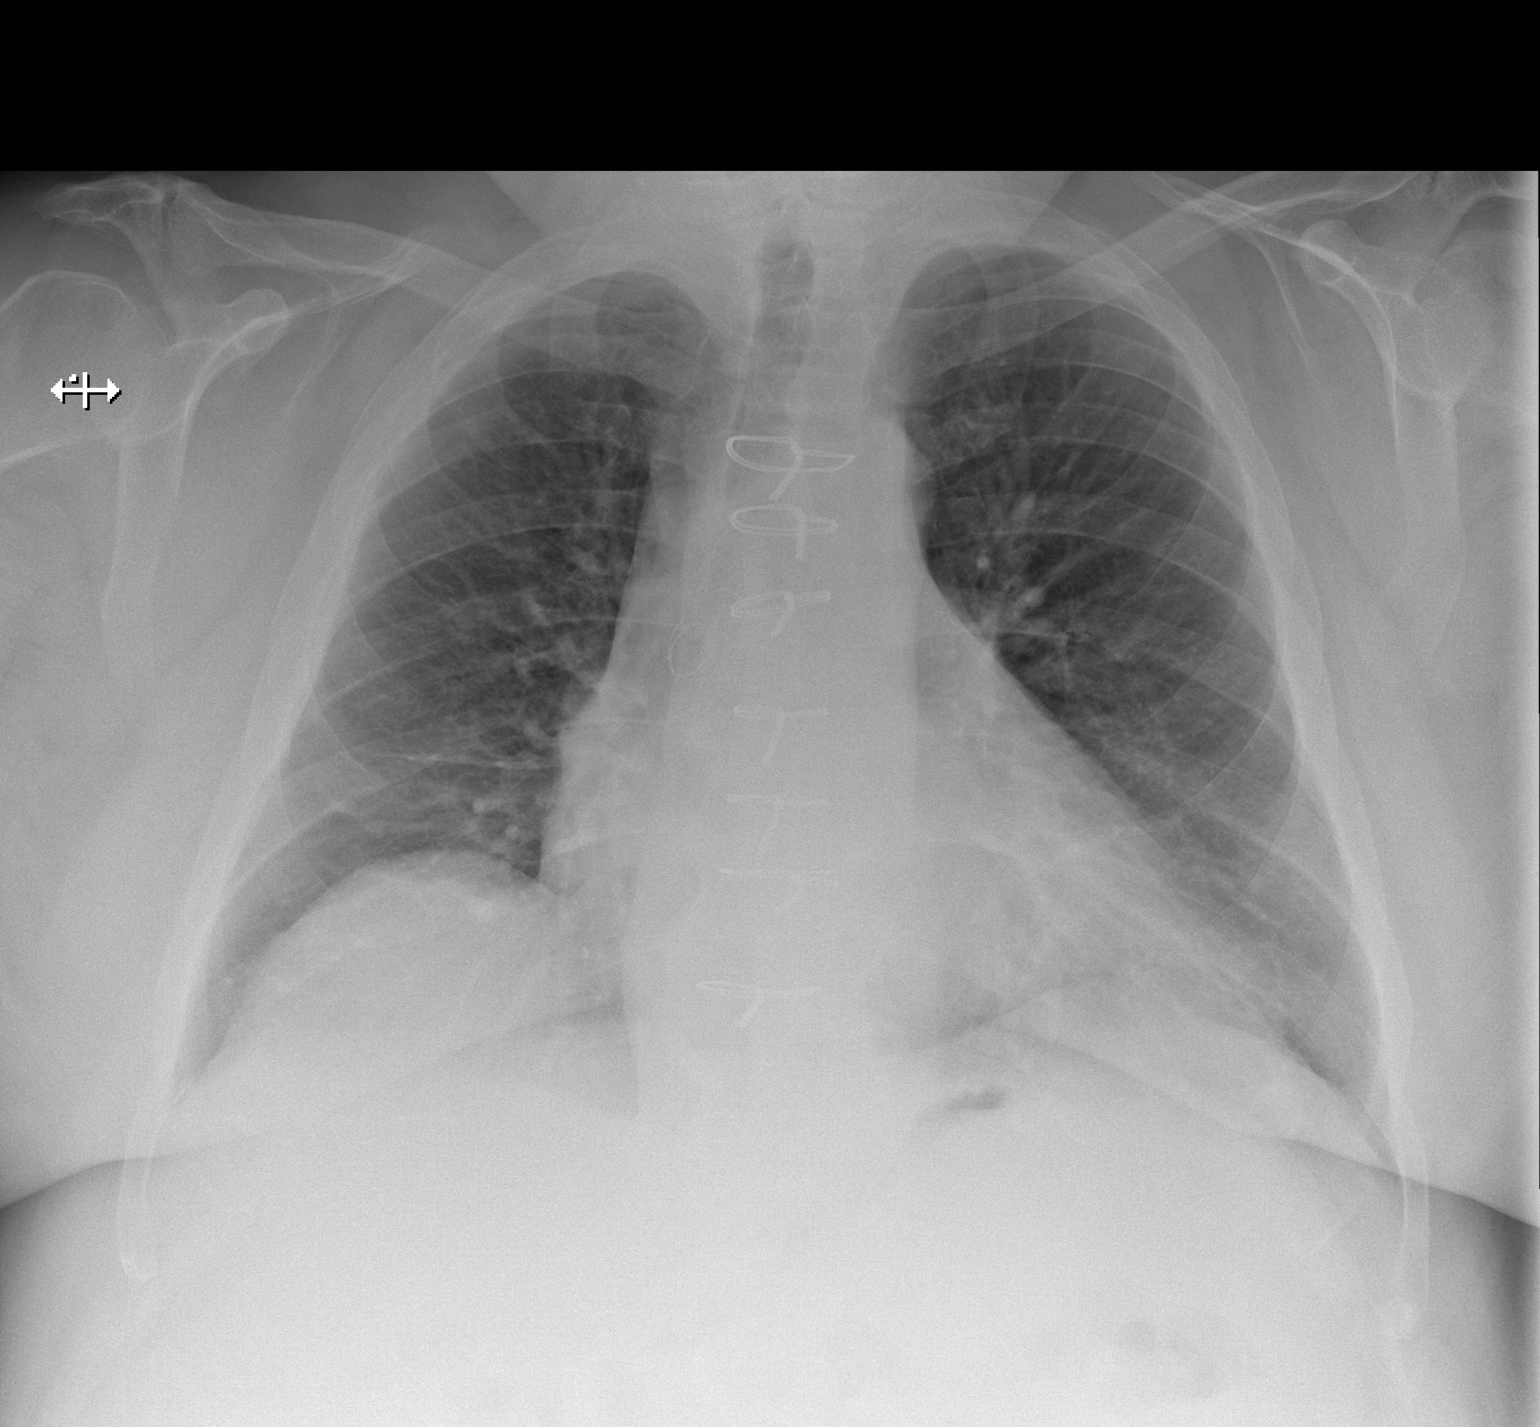
[im 2/2]
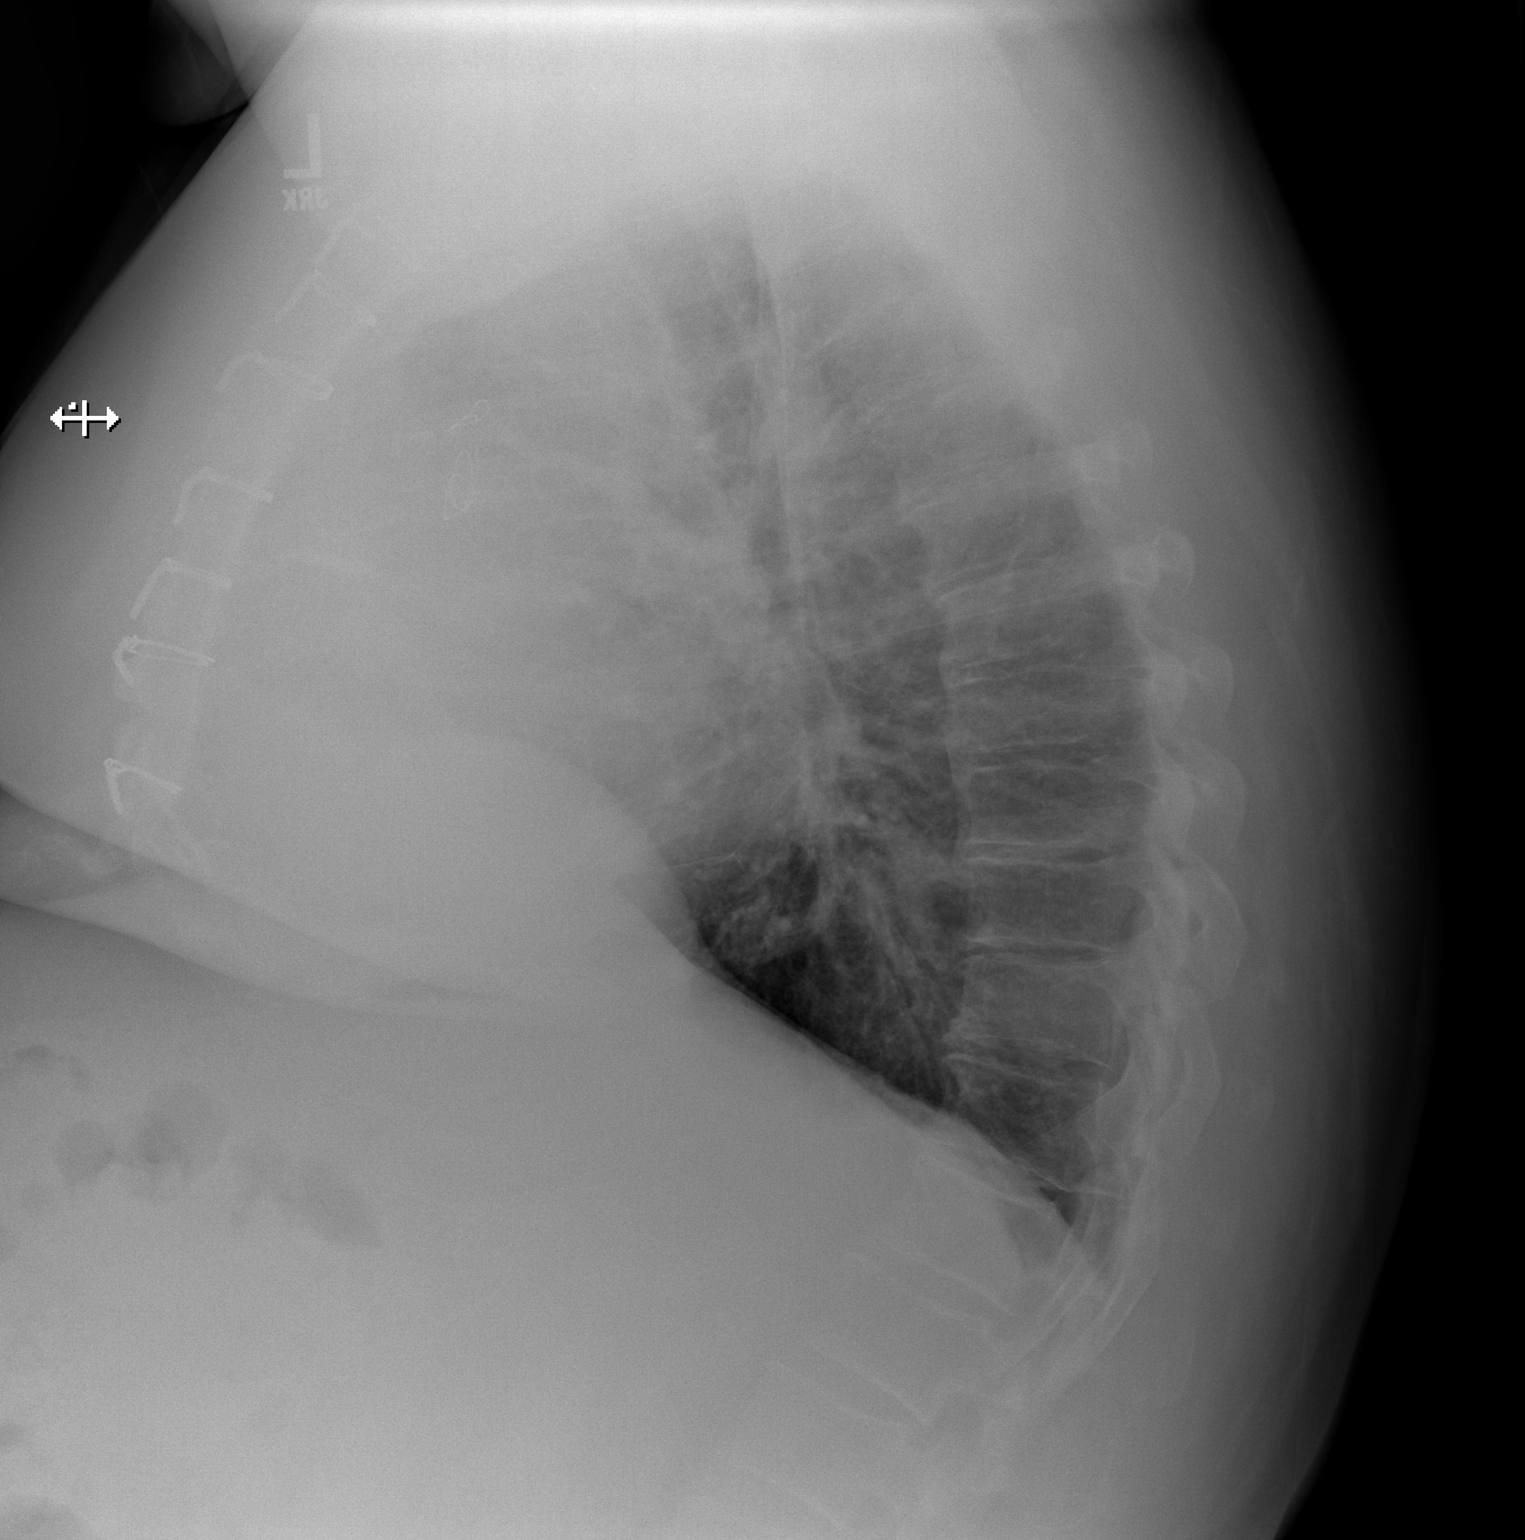

[2 of 2 positions shown; findings below may reference images not displayed]

FINDINGS: The lungs are well-aerated. Pulmonary vascularity is at the upper
limits of normal. There is no evidence of focal opacification,
pleural effusion or pneumothorax.

The heart is borderline normal in size; the patient is status post
median sternotomy. No acute osseous abnormalities are seen.
IMPRESSION: No acute cardiopulmonary process seen.

## 2013-08-27 ENCOUNTER — Ambulatory Visit: Payer: Medicare Other | Admitting: Cardiology

## 2013-09-12 ENCOUNTER — Encounter: Payer: Self-pay | Admitting: Cardiology

## 2013-09-12 ENCOUNTER — Ambulatory Visit (INDEPENDENT_AMBULATORY_CARE_PROVIDER_SITE_OTHER): Payer: Medicare Other | Admitting: Cardiology

## 2013-09-12 ENCOUNTER — Encounter: Payer: Self-pay | Admitting: General Surgery

## 2013-09-12 ENCOUNTER — Encounter (INDEPENDENT_AMBULATORY_CARE_PROVIDER_SITE_OTHER): Payer: Self-pay

## 2013-09-12 VITALS — BP 168/73 | HR 65 | Ht 65.0 in | Wt 294.4 lb

## 2013-09-12 DIAGNOSIS — I10 Essential (primary) hypertension: Secondary | ICD-10-CM

## 2013-09-12 DIAGNOSIS — R079 Chest pain, unspecified: Secondary | ICD-10-CM

## 2013-09-12 DIAGNOSIS — K224 Dyskinesia of esophagus: Secondary | ICD-10-CM | POA: Insufficient documentation

## 2013-09-12 DIAGNOSIS — R0989 Other specified symptoms and signs involving the circulatory and respiratory systems: Secondary | ICD-10-CM

## 2013-09-12 DIAGNOSIS — R0600 Dyspnea, unspecified: Secondary | ICD-10-CM

## 2013-09-12 DIAGNOSIS — K219 Gastro-esophageal reflux disease without esophagitis: Secondary | ICD-10-CM

## 2013-09-12 DIAGNOSIS — I2510 Atherosclerotic heart disease of native coronary artery without angina pectoris: Secondary | ICD-10-CM

## 2013-09-12 DIAGNOSIS — R0609 Other forms of dyspnea: Secondary | ICD-10-CM

## 2013-09-12 LAB — CBC
HCT: 43.9 % (ref 39.0–52.0)
Hemoglobin: 14.3 g/dL (ref 13.0–17.0)
MCHC: 32.6 g/dL (ref 30.0–36.0)
MCV: 89.3 fl (ref 78.0–100.0)
Platelets: 275 10*3/uL (ref 150.0–400.0)
RBC: 4.91 Mil/uL (ref 4.22–5.81)
RDW: 15.3 % — ABNORMAL HIGH (ref 11.5–14.6)
WBC: 12.6 10*3/uL — ABNORMAL HIGH (ref 4.5–10.5)

## 2013-09-12 LAB — TSH: TSH: 1.24 u[IU]/mL (ref 0.35–5.50)

## 2013-09-12 MED ORDER — LANSOPRAZOLE 15 MG PO CPDR: 15.0000 mg | DELAYED_RELEASE_CAPSULE | Freq: Every day | ORAL | Status: DC

## 2013-09-12 MED ORDER — POTASSIUM CHLORIDE ER 10 MEQ PO TBCR: 10.0000 meq | EXTENDED_RELEASE_TABLET | Freq: Every day | ORAL | Status: DC

## 2013-09-12 MED ORDER — FUROSEMIDE 40 MG PO TABS: 40.0000 mg | ORAL_TABLET | Freq: Every day | ORAL | Status: DC

## 2013-09-12 NOTE — Patient Instructions (Signed)
Your physician has recommended you make the following change in your medication: 1. Start Lasix 40 MG 1 tablet daily 2. Start Klor-con 10 Meq 1 tablet daily 3. Start Prevacid 15 MG 1 tablet Daily  Your physician has requested that you have an exercise stress myoview. For further information please visit [email or web address]. Please follow instruction sheet, as given.  Your physician recommends that you go to the lab today for a CMET, CBC, TSH, and Lipid Panel  Your physician recommends that you schedule a follow-up appointment in: One week after Stress test is done.

## 2013-09-12 NOTE — Progress Notes (Signed)
Patient ID: Evan [name], male   DOB: [birth year], 58 y.o.   MRN: [id number]     Patient Name: Evan [name] Date of Encounter: 09/12/2013  Primary Care Provider:  Yevette Edwards, NP Primary Cardiologist:  Ena Dawley, H (previously Dr Verl Blalock)  Problem List   Past Medical History  Diagnosis Date  . CAD (coronary artery disease)   . Hypertension   . Hyperlipidemia   . Gastroesophageal reflux   . Obstructive sleep apnea   . History of pneumonia     Remote  . History of scarlet fever     Childhood  . Obesity    Past Surgical History  Procedure Laterality Date  . Coronary artery bypass graft      Allergies  No Known Allergies  HPI  Mr Geimer has been followed by Dr. wall for coronary artery disease status post coronary artery bypass grafting in 2001, last seen here ago when he was under tremendous amount of financial stress having lost his home has had a lot of transportation issues. Since then he found his our department and he figure out the way how to get his medications for free. He is currently compliant with his meds. Over the last year he again and gained 12 pounds.   He states that he doesn't have a chest pain that was associated with his heart attacks, however on few occasions he experienced sharp right-sided chest pain does feel like cramps and walking around and stretching alleviated somewhat. One of these episodes he experienced this morning. He experiences shortness of breath on exertion. For example he tries to walk a mile and he has to call his friends to drive them back because he is not able to walk back. Patient also noticed lower extremity edema in the last couple of months. He denies orthopnea or paroxysmal nocturnal dyspnea as well as palpitations. No syncope. Patient was evaluated about a month ago for a cough with productive sputum, he was treated with antibiotic that resolved some of his problems however he still continues to having sputum especially  when laying in a horizontal position in bed he also experiences burning in his chest and runny daily and gave him minimal  Home Medications  Prior to Admission medications   Medication Sig Start Date End Date Taking? Authorizing Provider  aspirin 81 MG EC tablet Take 81 mg by mouth 2 (two) times daily.    Yes Historical Provider, MD  metoprolol tartrate (LOPRESSOR) 25 MG tablet Take 25 mg by mouth 2 (two) times daily.     Yes Historical Provider, MD  ranitidine (ZANTAC) 150 MG capsule Take 150 mg by mouth 2 (two) times daily.     Yes Historical Provider, MD  sertraline (ZOLOFT) 50 MG tablet 50 mg daily. 09/10/13  Yes Historical Provider, MD  simvastatin (ZOCOR) 40 MG tablet Take 40 mg by mouth daily.   Yes Historical Provider, MD    Family History  Family History  Problem Relation Age of Onset  . Coronary artery disease Other     Social History  History   Social History  . Marital Status: Single    Spouse Name: N/A    Number of Children: N/A  . Years of Education: N/A   Occupational History  .      disabled   Social History Main Topics  . Smoking status: Never Smoker   . Smokeless tobacco: Never Used  . Alcohol Use: No  . Drug Use: No  . Sexual Activity:  Not on file   Other Topics Concern  . Not on file   Social History Narrative   Disabled   Single     Review of Systems, as per HPI, otherwise negative General:  No chills, fever, night sweats or weight changes.  Cardiovascular:  No chest pain, dyspnea on exertion, edema, orthopnea, palpitations, paroxysmal nocturnal dyspnea. Dermatological: No rash, lesions/masses Respiratory: No cough, dyspnea Urologic: No hematuria, dysuria Abdominal:   No nausea, vomiting, diarrhea, bright red blood per rectum, melena, or hematemesis Neurologic:  No visual changes, wkns, changes in mental status. All other systems reviewed and are otherwise negative except as noted above.  Physical Exam  Blood pressure 168/73, pulse 65,  height 5\' 5"  (1.651 m), weight 294 lb 6.4 oz (133.539 kg).  General: Pleasant, NAD, morbidly obese Psych: Normal affect. Neuro: Alert and oriented X 3. Moves all extremities spontaneously. HEENT: Normal  Neck: Supple without bruits, short thick neck, unable to evaluate JVDs. Lungs:  Resp regular and unlabored, crackles at the right base. Heart: RRR no s3, s4, or murmurs. Abdomen: Soft, non-tender, non-distended, BS + x 4.  Extremities: No clubbing, cyanosis or edema. DP/PT/Radials 2+ and equal bilaterally.  Labs:   Lab Results  Component Value Date   CHOL 257* 07/26/2011   HDL 46.50 07/26/2011   LDLCALC 166* 11/03/2008   TRIG 145.0 07/26/2011   Accessory Clinical Findings  Echocardiogram - none  ECG - sinus rhythm, prior inferior infarct age undetermined, negative T waves in the lateral walls unchanged from prior EKG in January 2014.   Assessment & Plan  58 year old gentleman  1. CAD, status post CABG in 2001, the patient is complaining of atypical chest pain, however considering his significant amount history of coronary artery disease and risk factors that include recent weight gain we will order an exercise nuclear stress test to rule out ischemia.  2. DOE - possibly due to ischemia however patient shows clinical signs of quit of fluid overload including crackles on his lungs and lower extremity extremity edema, or we will evaluate it at that his LVEF on nuclear stress test. We'll start him on Lasix 40 orally daily and potassium chloride 10 mEq daily. We will check his labs today.  2. Hypertension -  uncontrolled, patient was started on Lasix and recheck at the stress test.  4. Hyperlipidemia - currently on Zocor not checked since 2012 we'll check today.  5. GERD - we'll add Prevacid 15 mg daily to his regimen and reevaluate at the next visit in 3 weeks.  Followup in 3 weeks.   Ena Dawley, Lemmie Evens, MD, [facility] 09/12/2013, 9:58 AM

## 2013-09-15 LAB — COMPREHENSIVE METABOLIC PANEL
ALT: 24 U/L (ref 0–53)
AST: 22 U/L (ref 0–37)
Albumin: 4 g/dL (ref 3.5–5.2)
Alkaline Phosphatase: 80 U/L (ref 39–117)
BUN: 16 mg/dL (ref 6–23)
CO2: 29 mEq/L (ref 19–32)
Calcium: 9.1 mg/dL (ref 8.4–10.5)
Chloride: 104 mEq/L (ref 96–112)
Creatinine, Ser: 0.9 mg/dL (ref 0.4–1.5)
GFR: 98.05 mL/min (ref >60.00)
Glucose, Bld: 103 mg/dL — ABNORMAL HIGH (ref 70–99)
Potassium: 4.1 mEq/L (ref 3.5–5.1)
Sodium: 139 mEq/L (ref 135–145)
Total Bilirubin: 0.6 mg/dL (ref 0.3–1.2)
Total Protein: 7.5 g/dL (ref 6.0–8.3)

## 2013-09-15 LAB — LIPID PANEL
Cholesterol: 182 mg/dL (ref 0–200)
HDL: 48.3 mg/dL (ref >39.00)
LDL Cholesterol: 109 mg/dL — ABNORMAL HIGH (ref 0–99)
Total CHOL/HDL Ratio: 4
Triglycerides: 126 mg/dL (ref 0.0–149.0)
VLDL: 25.2 mg/dL (ref 0.0–40.0)

## 2013-09-16 ENCOUNTER — Telehealth: Payer: Self-pay | Admitting: Cardiology

## 2013-09-16 NOTE — Telephone Encounter (Signed)
New Prob    Pt states he received a call yesterday, but no VM was left. Pt would like to speak to a nurse regarding his concerns. Please call.

## 2013-09-18 NOTE — Telephone Encounter (Signed)
Pt given lab results, he verbalized understanding.

## 2013-09-19 ENCOUNTER — Emergency Department: Payer: Self-pay | Admitting: Emergency Medicine

## 2013-09-30 ENCOUNTER — Encounter (HOSPITAL_COMMUNITY): Payer: Medicare Other

## 2013-10-02 ENCOUNTER — Observation Stay: Payer: Self-pay | Admitting: Internal Medicine

## 2013-10-02 LAB — PROTIME-INR
INR: 1
Prothrombin Time: 13 secs (ref 11.5–14.7)

## 2013-10-02 LAB — URINALYSIS, COMPLETE
Bacteria: NONE SEEN
Bilirubin,UR: NEGATIVE
Blood: NEGATIVE
Glucose,UR: NEGATIVE mg/dL (ref 0–75)
Ketone: NEGATIVE
Leukocyte Esterase: NEGATIVE
Nitrite: NEGATIVE
Ph: 6 (ref 4.5–8.0)
Protein: NEGATIVE
RBC,UR: 1 /HPF (ref 0–5)
Specific Gravity: 1.023 (ref 1.003–1.030)
Squamous Epithelial: NONE SEEN
WBC UR: 1 /HPF (ref 0–5)

## 2013-10-02 LAB — CBC WITH DIFFERENTIAL/PLATELET
Basophil #: 0.1 10*3/uL (ref 0.0–0.1)
Basophil %: 0.9 %
Eosinophil #: 0.3 10*3/uL (ref 0.0–0.7)
Eosinophil %: 2.8 %
HCT: 43.4 % (ref 40.0–52.0)
HGB: 13.8 g/dL (ref 13.0–18.0)
Lymphocyte #: 2.7 10*3/uL (ref 1.0–3.6)
Lymphocyte %: 23.3 %
MCH: 28.5 pg (ref 26.0–34.0)
MCHC: 31.9 g/dL — ABNORMAL LOW (ref 32.0–36.0)
MCV: 89 fL (ref 80–100)
Monocyte #: 0.9 x10 3/mm (ref 0.2–1.0)
Monocyte %: 7.8 %
Neutrophil #: 7.7 10*3/uL — ABNORMAL HIGH (ref 1.4–6.5)
Neutrophil %: 65.2 %
Platelet: 249 10*3/uL (ref 150–440)
RBC: 4.86 10*6/uL (ref 4.40–5.90)
RDW: 15.3 % — ABNORMAL HIGH (ref 11.5–14.5)
WBC: 11.8 10*3/uL — ABNORMAL HIGH (ref 3.8–10.6)

## 2013-10-02 LAB — CBC
HCT: 41.2 % (ref 40.0–52.0)
HGB: 13.8 g/dL (ref 13.0–18.0)
MCH: 29.7 pg (ref 26.0–34.0)
MCHC: 33.5 g/dL (ref 32.0–36.0)
MCV: 89 fL (ref 80–100)
Platelet: 211 10*3/uL (ref 150–440)
RBC: 4.63 10*6/uL (ref 4.40–5.90)
RDW: 15.3 % — ABNORMAL HIGH (ref 11.5–14.5)
WBC: 10.5 10*3/uL (ref 3.8–10.6)

## 2013-10-02 LAB — COMPREHENSIVE METABOLIC PANEL
Albumin: 3.5 g/dL (ref 3.4–5.0)
Alkaline Phosphatase: 106 U/L
Anion Gap: 4 — ABNORMAL LOW (ref 7–16)
BUN: 17 mg/dL (ref 7–18)
Bilirubin,Total: 0.3 mg/dL (ref 0.2–1.0)
Calcium, Total: 9 mg/dL (ref 8.5–10.1)
Chloride: 106 mmol/L (ref 98–107)
Co2: 28 mmol/L (ref 21–32)
Creatinine: 1.1 mg/dL (ref 0.60–1.30)
EGFR (African American): >60
EGFR (Non-African Amer.): >60
Glucose: 126 mg/dL — ABNORMAL HIGH (ref 65–99)
Osmolality: 279 (ref 275–301)
Potassium: 4.1 mmol/L (ref 3.5–5.1)
SGOT(AST): 41 U/L — ABNORMAL HIGH (ref 15–37)
SGPT (ALT): 46 U/L (ref 12–78)
Sodium: 138 mmol/L (ref 136–145)
Total Protein: 7.5 g/dL (ref 6.4–8.2)

## 2013-10-02 LAB — APTT: Activated PTT: 30.3 secs (ref 23.6–35.9)

## 2013-10-02 LAB — TROPONIN I: Troponin-I: <0.02 ng/mL

## 2013-10-02 LAB — LIPASE, BLOOD: Lipase: 147 U/L (ref 73–393)

## 2013-10-02 IMAGING — CT CT ABD-PELV W/ CM
2 of 5 series · 17 of 46 positions shown, 19 images · IV contrast (isovue)
Comparison: [DATE] CT

CLINICAL DATA: 58-year-old male with abdominal pelvic pain. Bloody
diarrhea.

EXAM:
CT ABDOMEN AND PELVIS WITH CONTRAST
TECHNIQUE: Multidetector CT imaging of the abdomen and pelvis was performed
using the standard protocol following bolus administration of
intravenous contrast.
CONTRAST:  125 cc intravenous Isovue 370

[Series 2: routine abd pel with · axial · 0.98mm/px · z∈[-451,-16]mm · 14 of 99 slices shown, 16 images]
[im 6/99  soft-tissue]
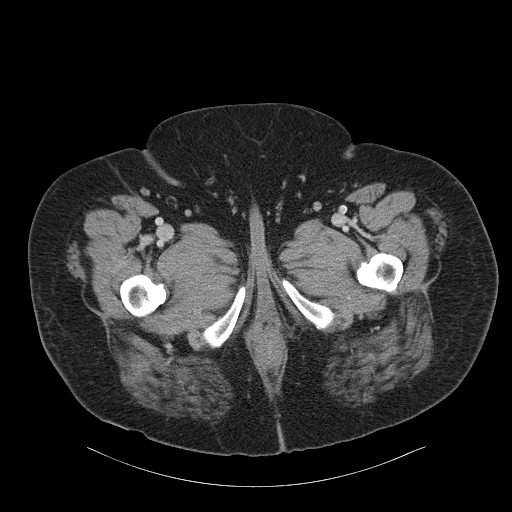
[im 6/99  bone]
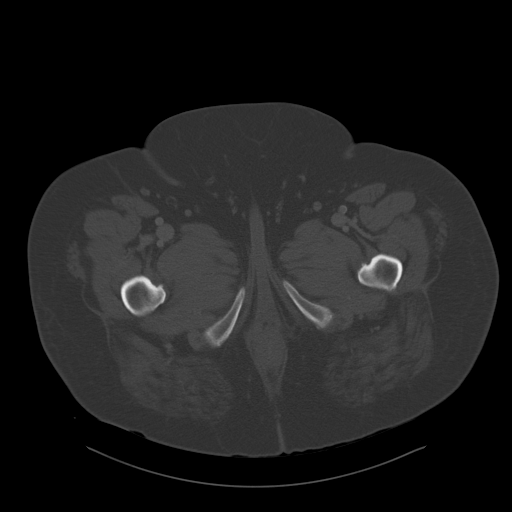
[im 11/99  soft-tissue]
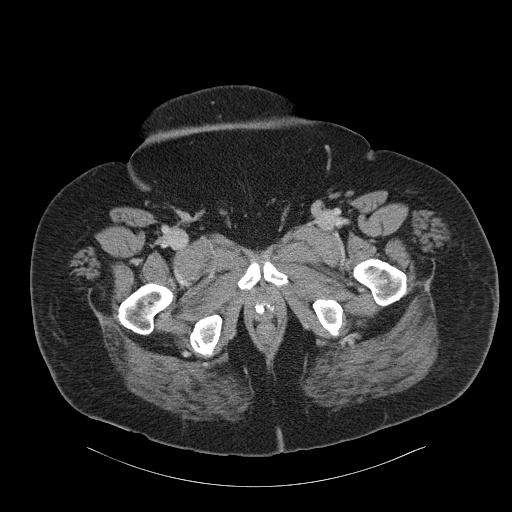
[im 22/99  soft-tissue]
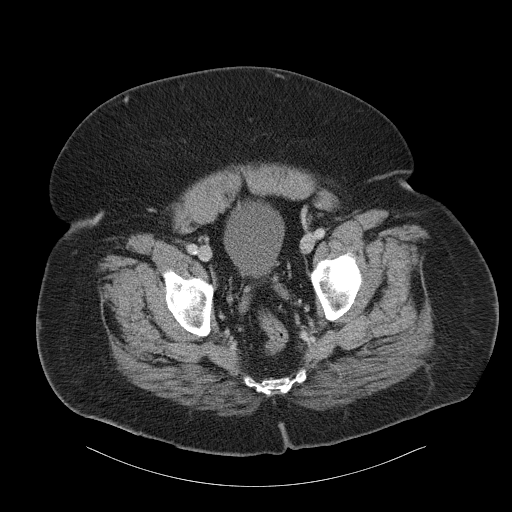
[im 28/99  soft-tissue]
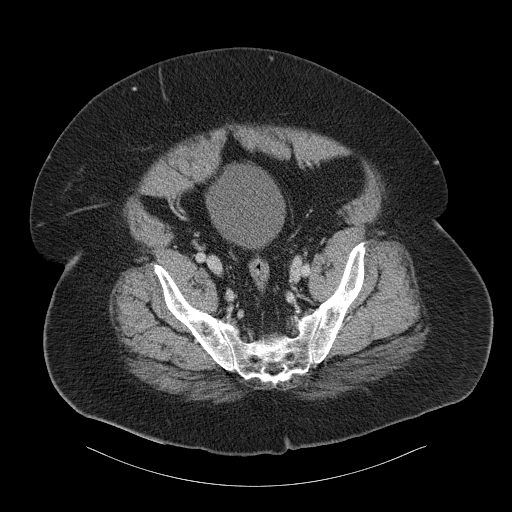
[im 33/99  soft-tissue]
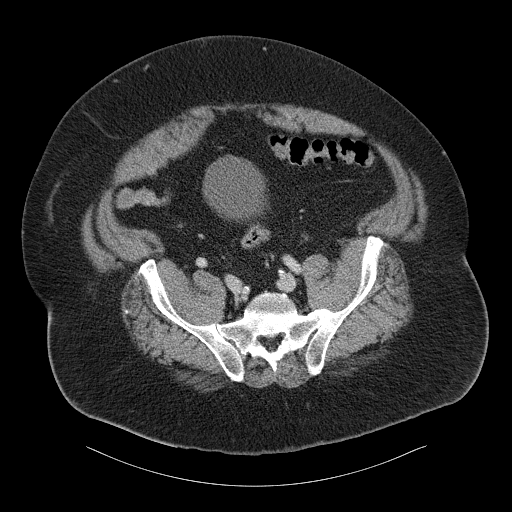
[im 39/99  soft-tissue]
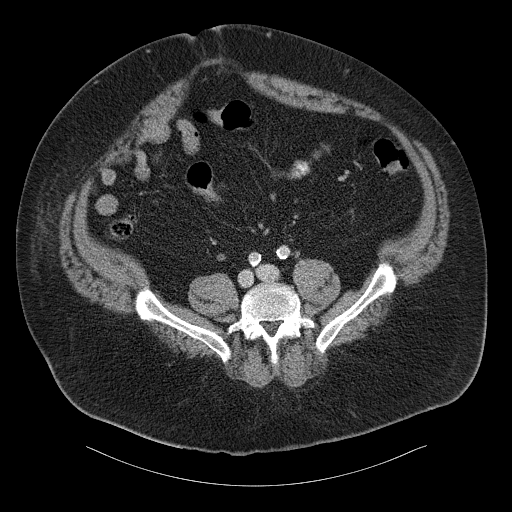
[im 44/99  soft-tissue]
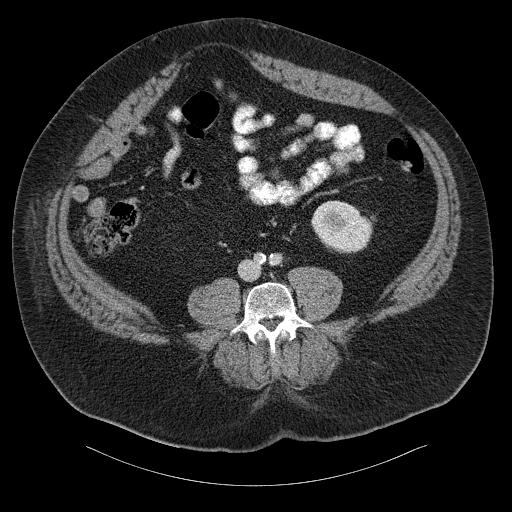
[im 55/99  soft-tissue]
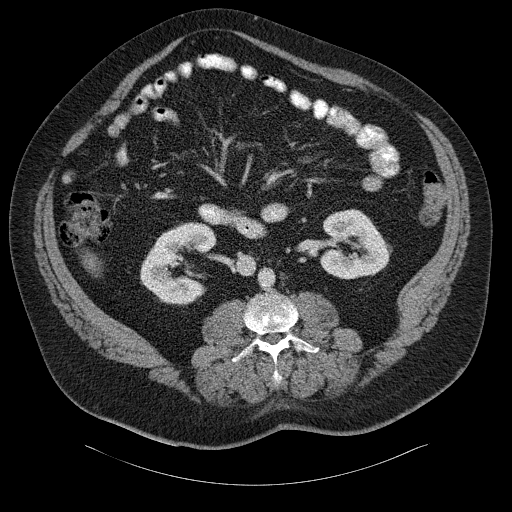
[im 60/99  soft-tissue]
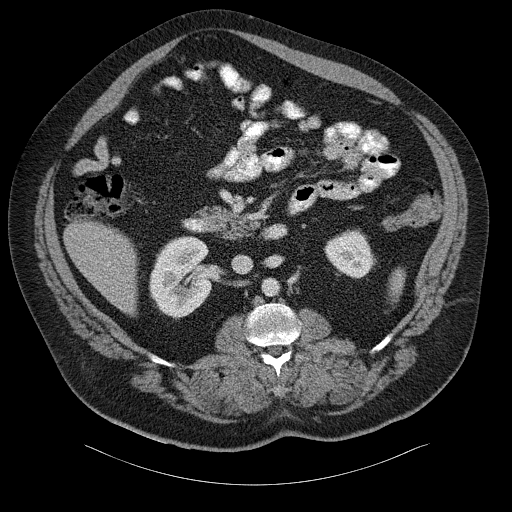
[im 60/99  bone]
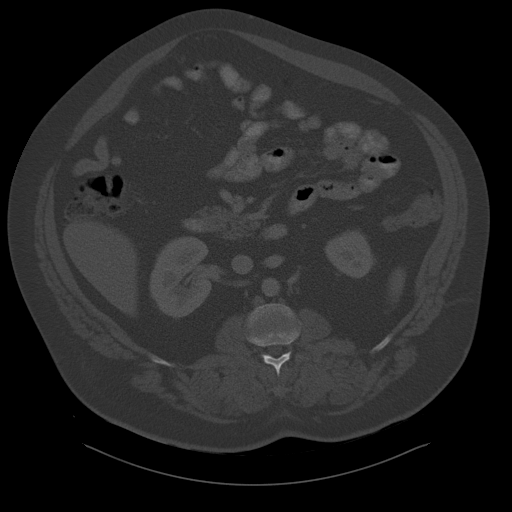
[im 66/99  soft-tissue]
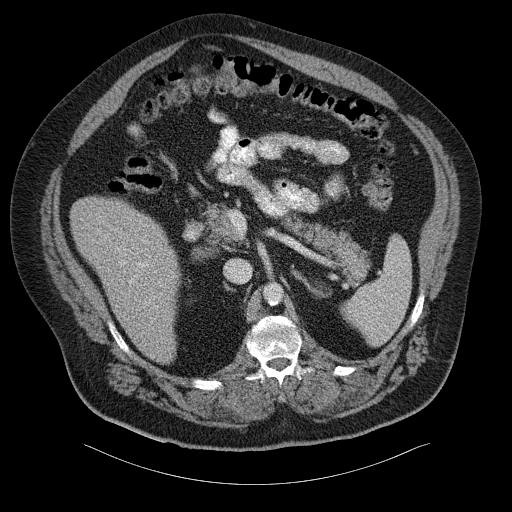
[im 71/99  soft-tissue]
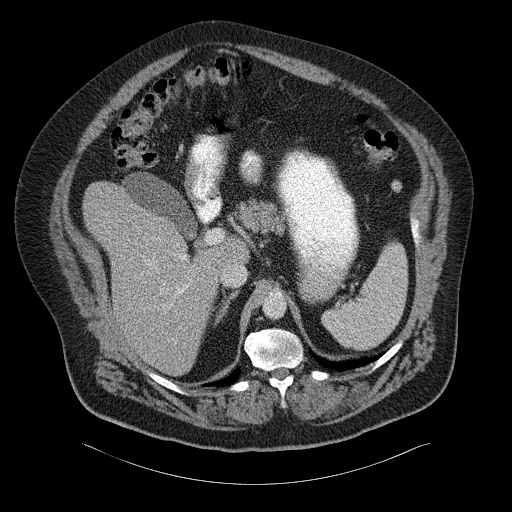
[im 77/99  soft-tissue]
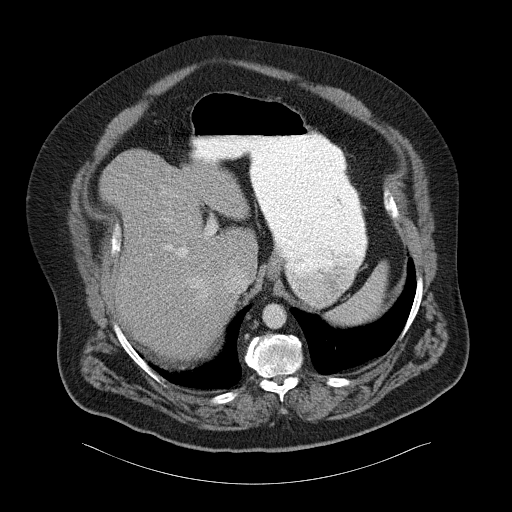
[im 88/99  soft-tissue]
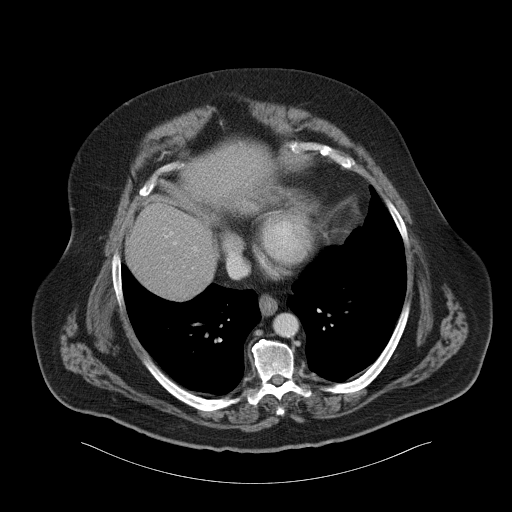
[im 93/99  soft-tissue]
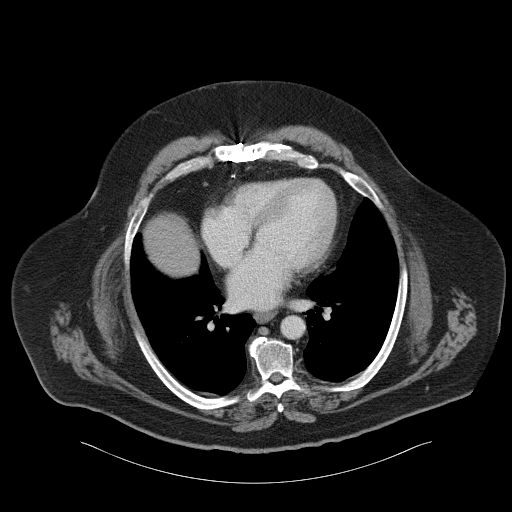

[Series 5: cor routine abd pel with · coronal · 0.96mm/px · 3 of 210 slices shown]
[im 70/210  soft-tissue]
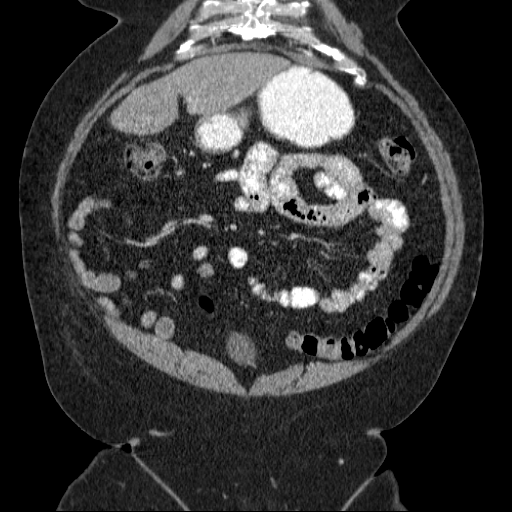
[im 93/210  soft-tissue]
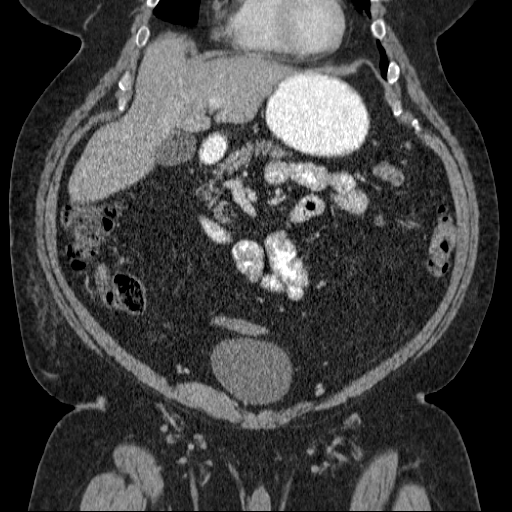
[im 117/210  soft-tissue]
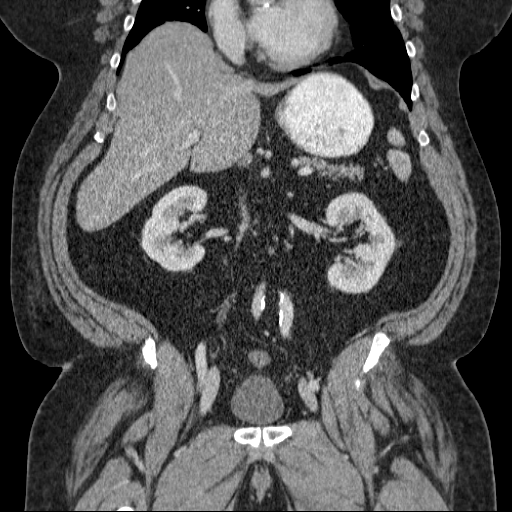

[17 of 46 positions shown; findings below may reference images not displayed]

FINDINGS: Cardiomegaly again noted.

The liver, spleen, pancreas, adrenal glands, gallbladder and kidneys
are unremarkable.

There is no evidence of free fluid, enlarged lymph nodes, biliary
dilation or abdominal aortic aneurysm.

The bowel, bladder and appendix are unremarkable. There is no
evidence of bowel obstruction, abscess or pneumoperitoneum.

A small umbilical hernia containing fat is again noted.

No acute or suspicious bony abnormalities are present.

Minimal degenerative changes in the lumbar spine noted.
IMPRESSION: No evidence of acute abnormality.

Cardiomegaly and small umbilical hernia containing fat.

## 2013-10-02 IMAGING — CR DG CHEST 2V
1 series · 2 of 2 positions shown · non-contrast
Comparison: PA and lateral chest [DATE].

CLINICAL DATA: Shortness of breath.  Lower extremity swelling.

EXAM:
CHEST  2 VIEW

[Series 7: w chest pa · 0.14mm/px · 2 of 2 slices shown]
[im 1/2]
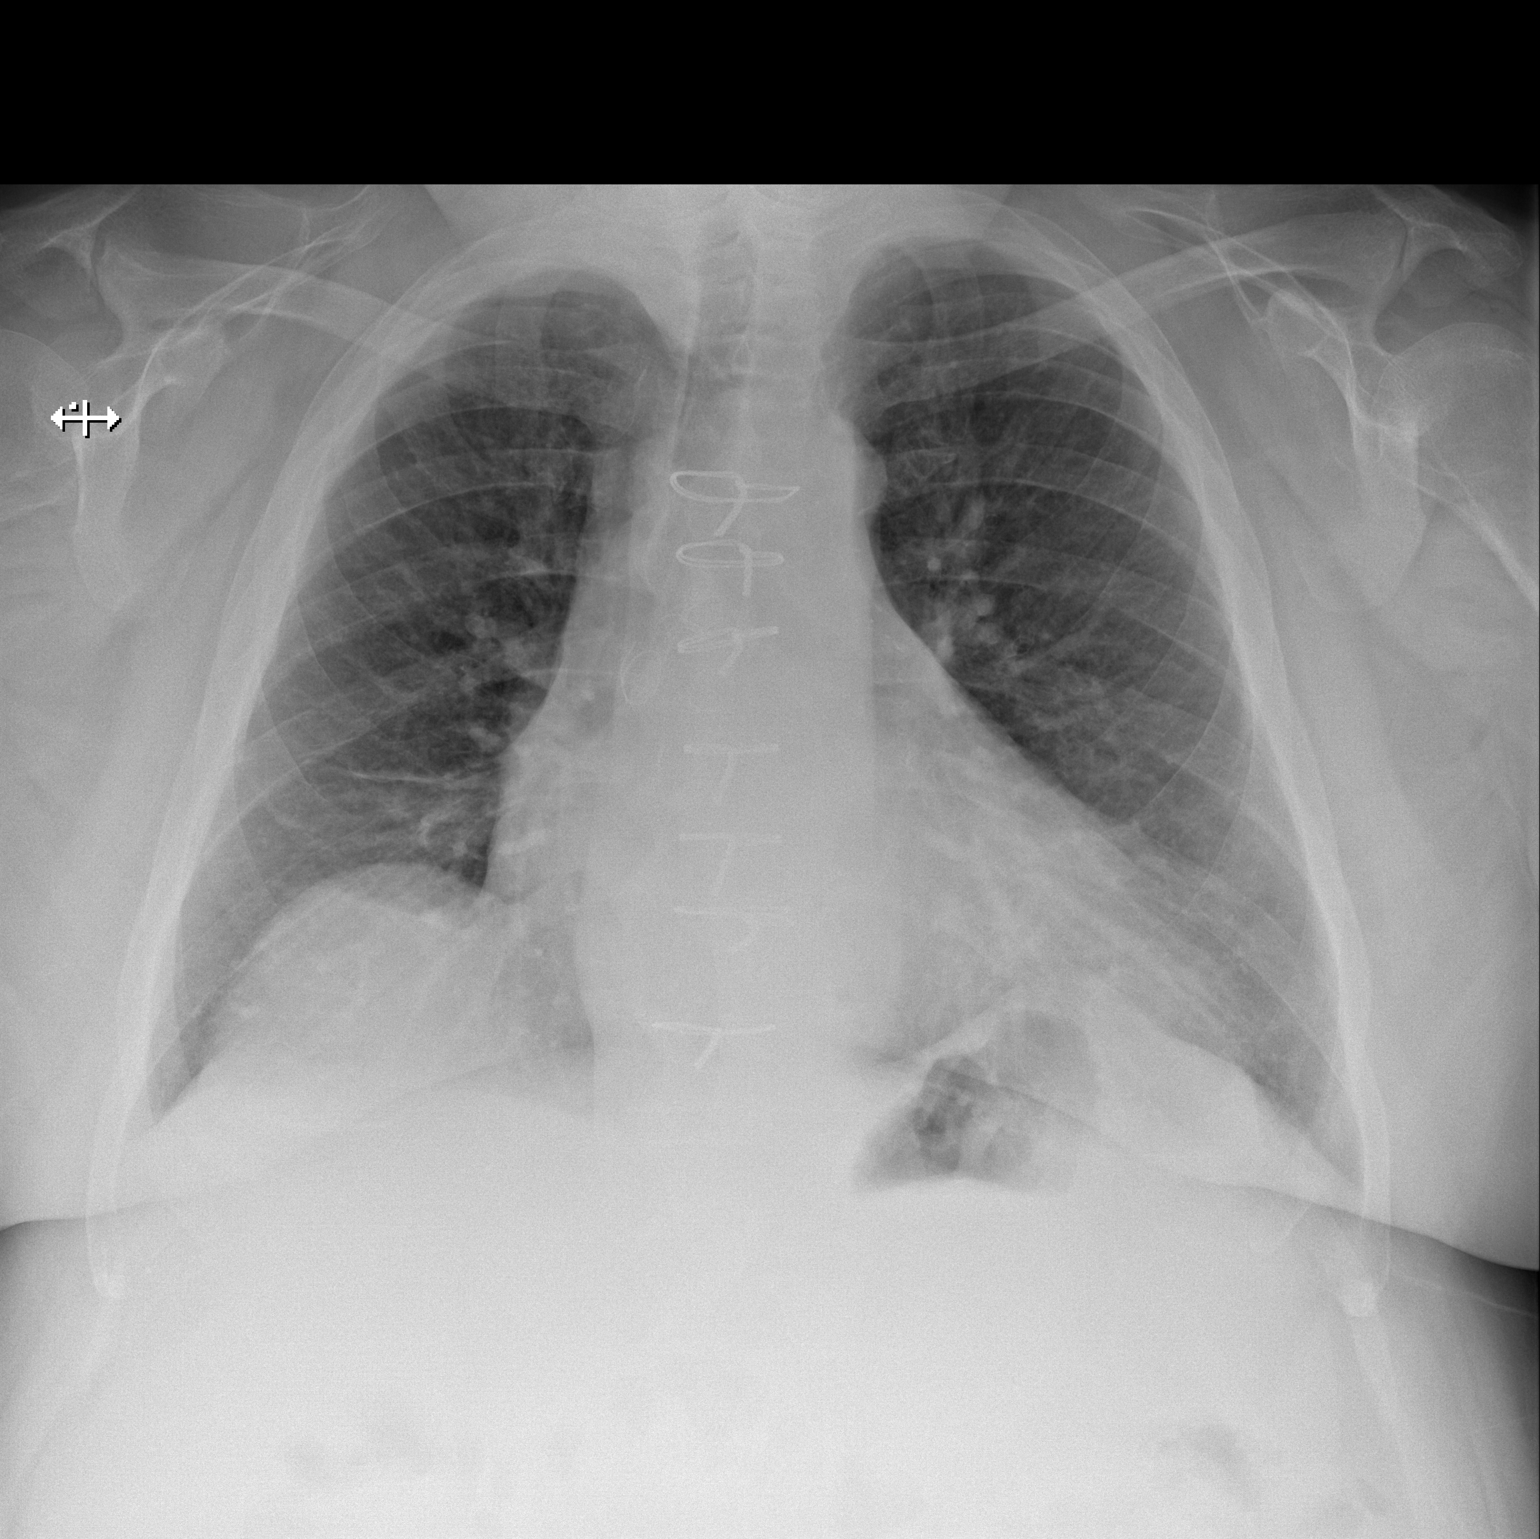
[im 2/2]
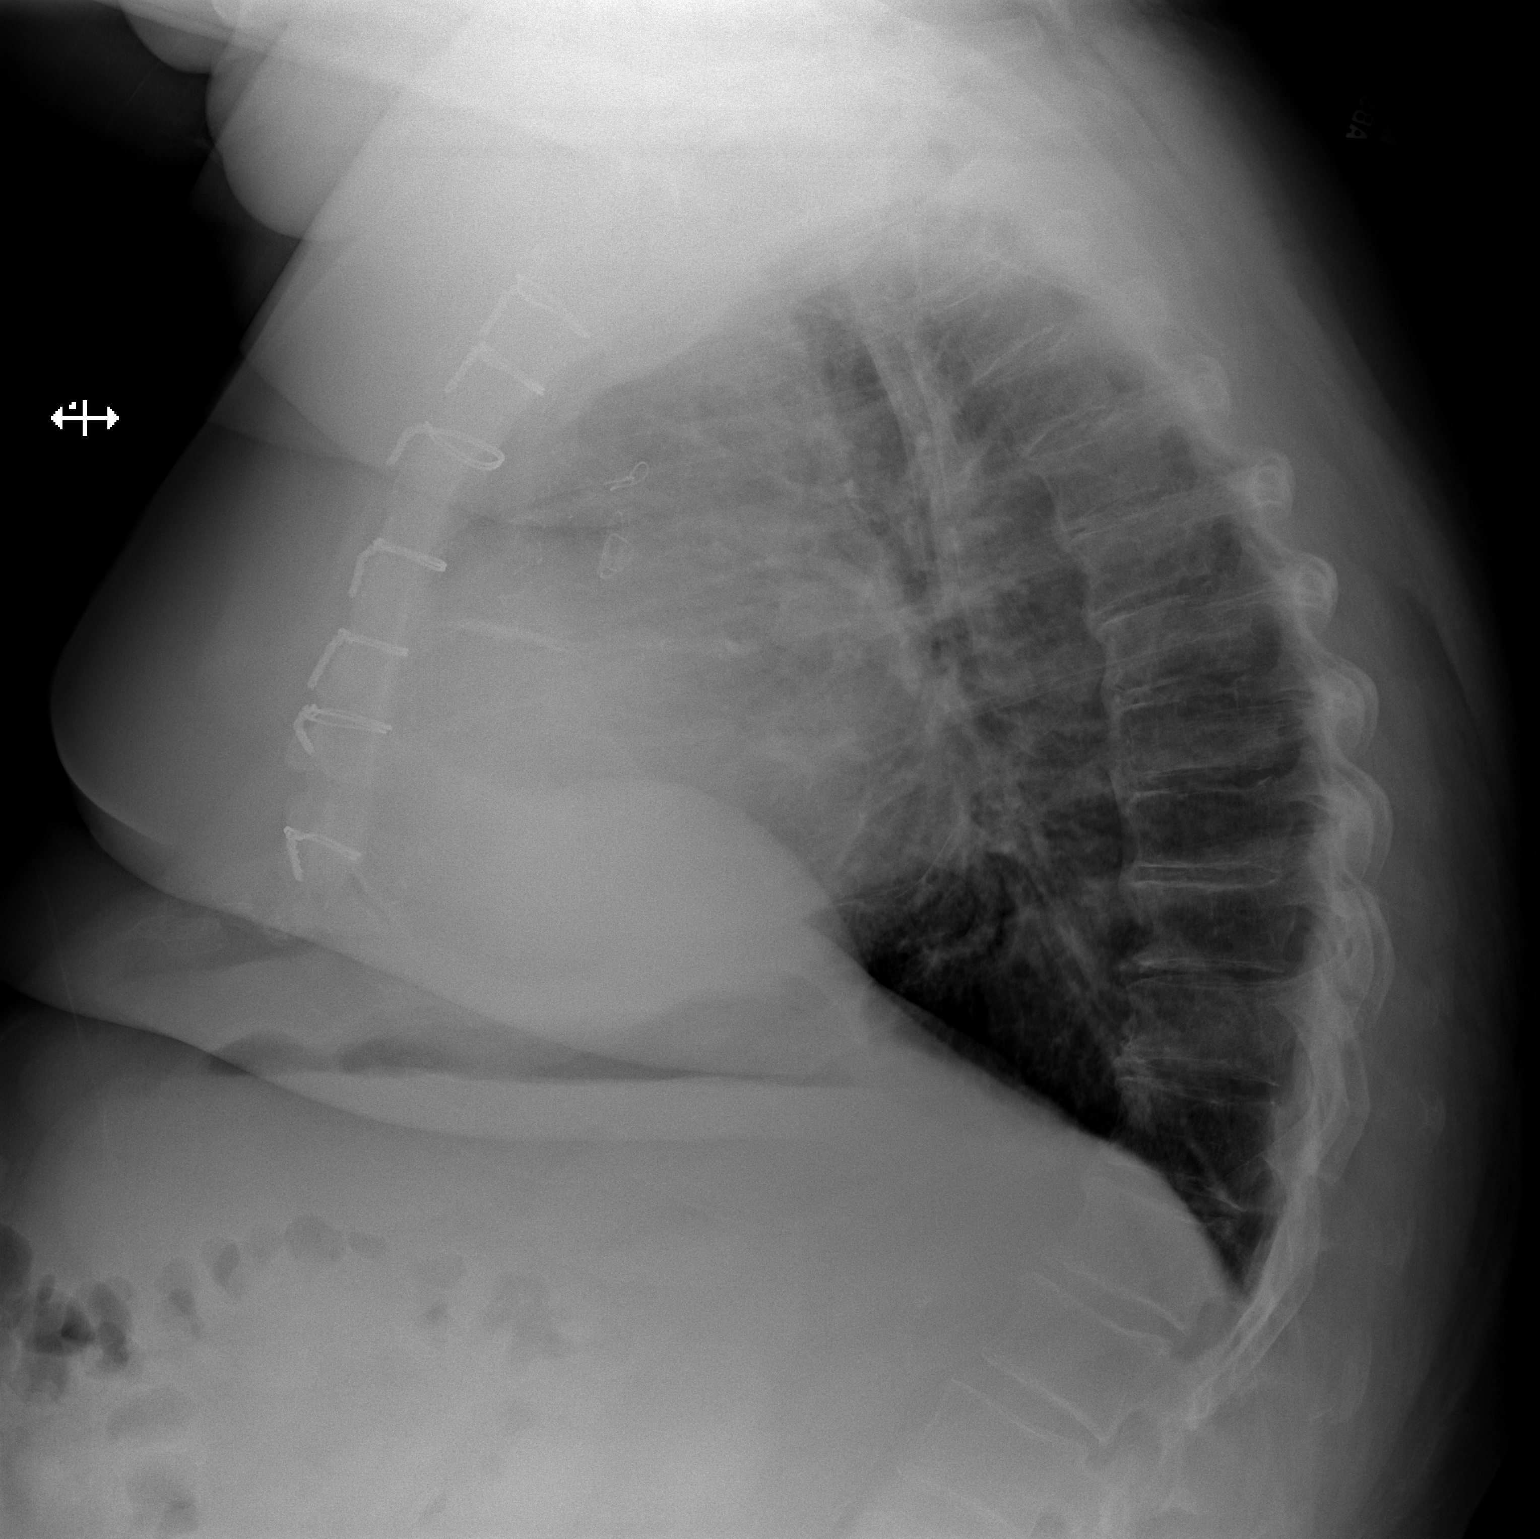

[2 of 2 positions shown; findings below may reference images not displayed]

FINDINGS: The patient is status post CABG. There is cardiomegaly but no edema.
Lungs are clear. No pneumothorax or pleural effusion. Eventration
right hemidiaphragm is noted.
IMPRESSION: Cardiomegaly without acute disease.

## 2013-10-02 IMAGING — US US EXTREM LOW VENOUS*R*
1 series · 14 of 24 positions shown · non-contrast
Comparison: None.

CLINICAL DATA: Right lower extremity calf swelling, redness, and
erythema.

EXAM:
Right LOWER EXTREMITY VENOUS DOPPLER ULTRASOUND
TECHNIQUE: Gray-scale sonography with graded compression, as well as color
Doppler and duplex ultrasound, were performed to evaluate the deep
venous system from the level of the common femoral vein through the
popliteal and proximal calf veins. Spectral Doppler was utilized to
evaluate flow at rest and with distal augmentation maneuvers.

[Series 1: us extrem low venous*right* · 0.12mm/px · 14 of 30 slices shown]
[im 1/30]
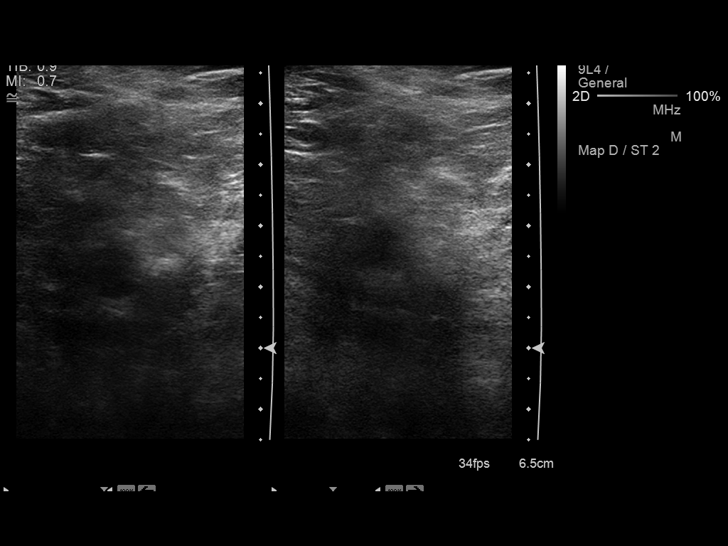
[im 3/30]
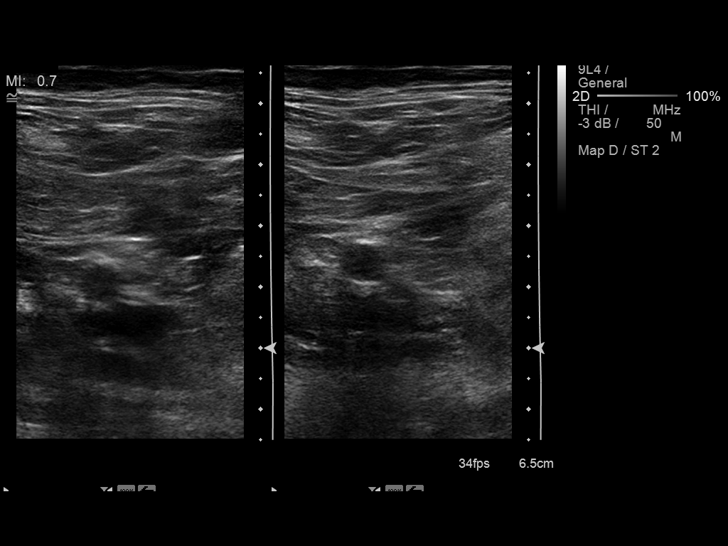
[im 6/30]
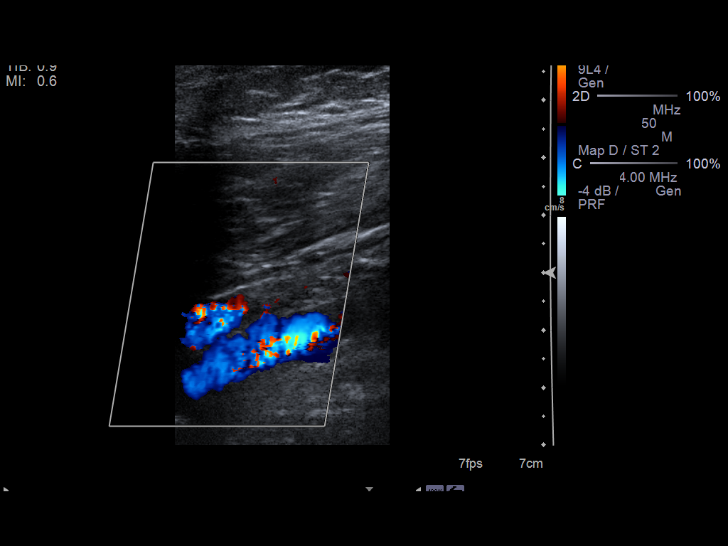
[im 8/30]
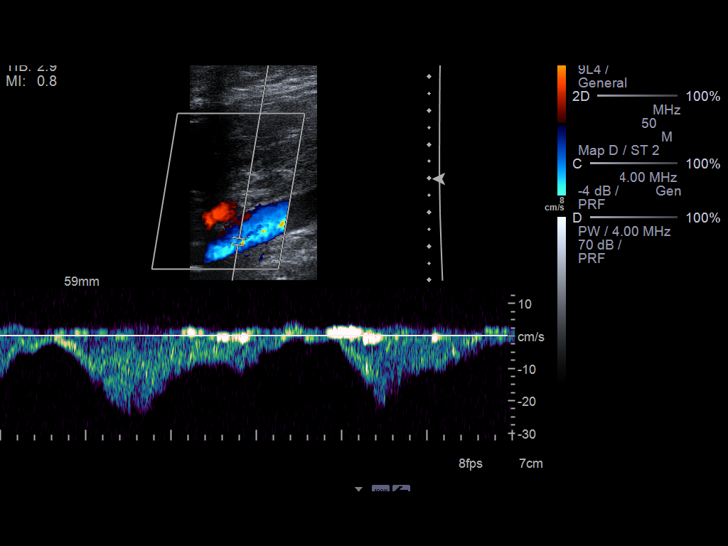
[im 9/30]
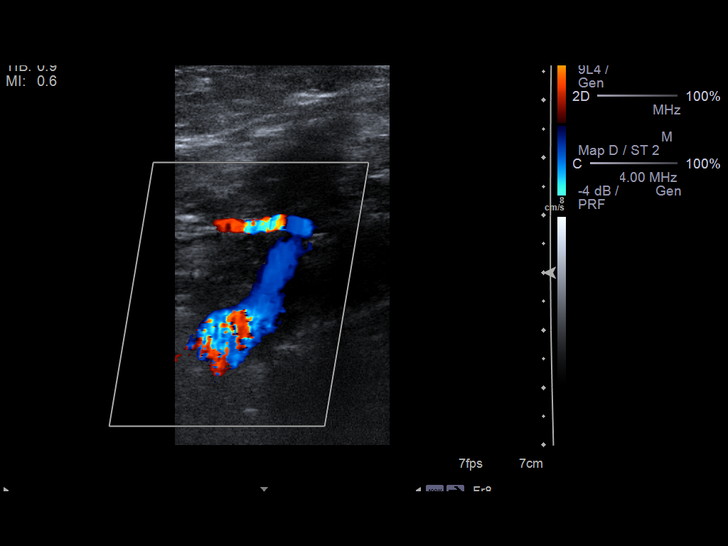
[im 12/30]
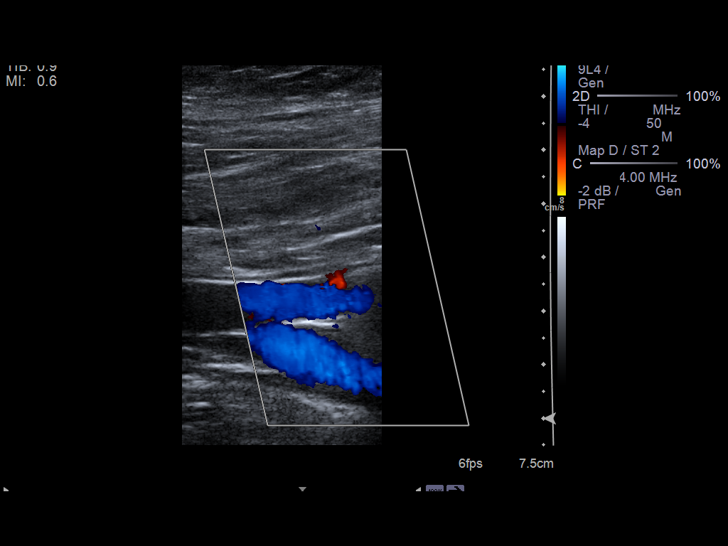
[im 14/30]
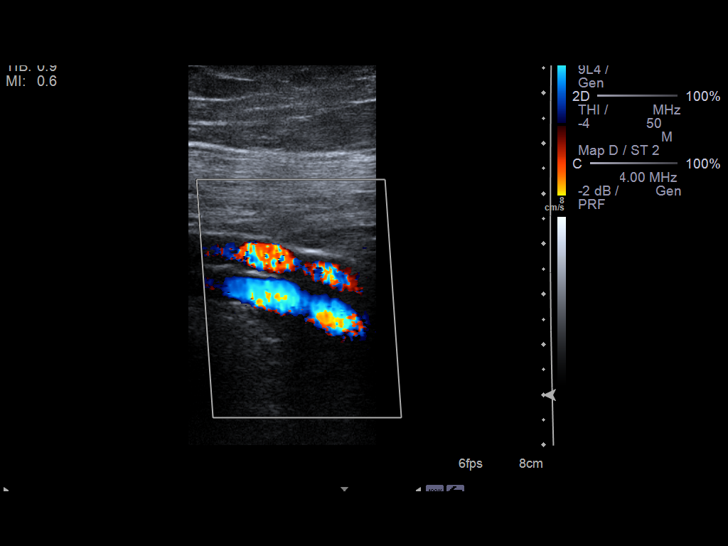
[im 16/30]
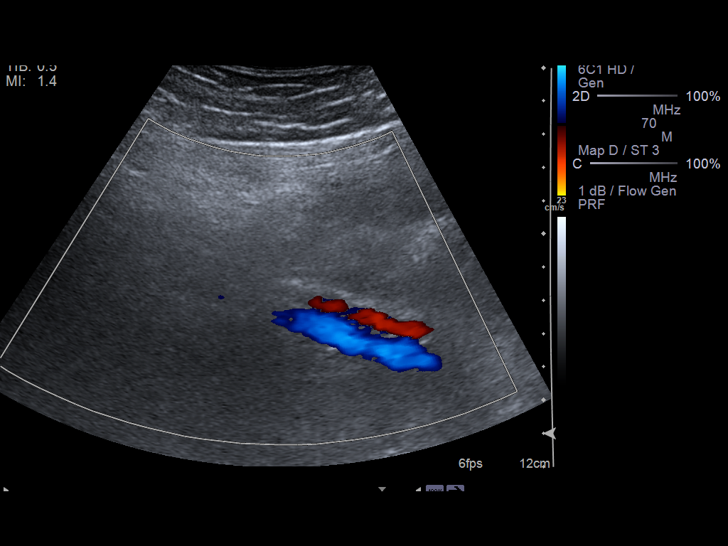
[im 18/30]
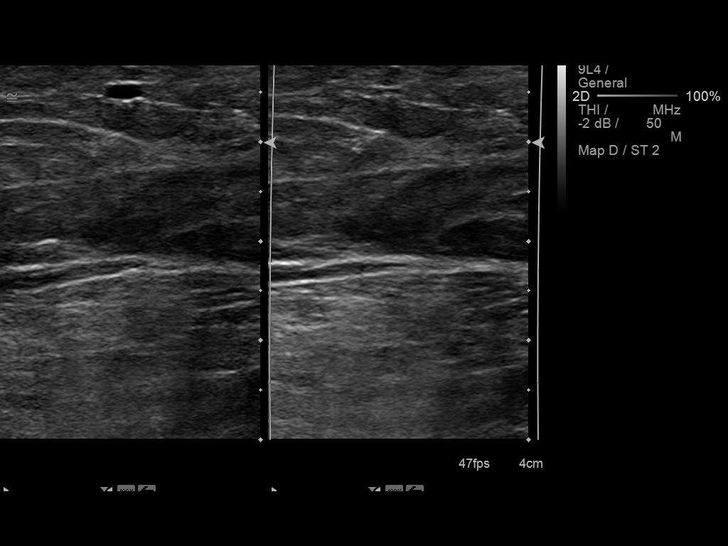
[im 21/30]
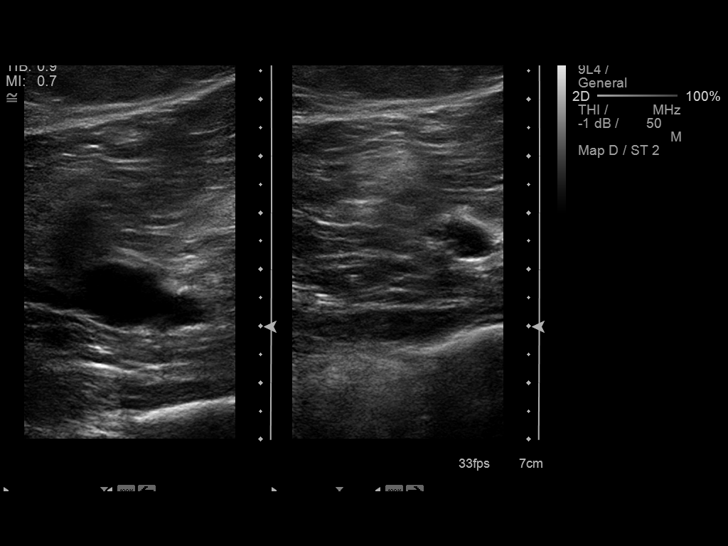
[im 23/30]
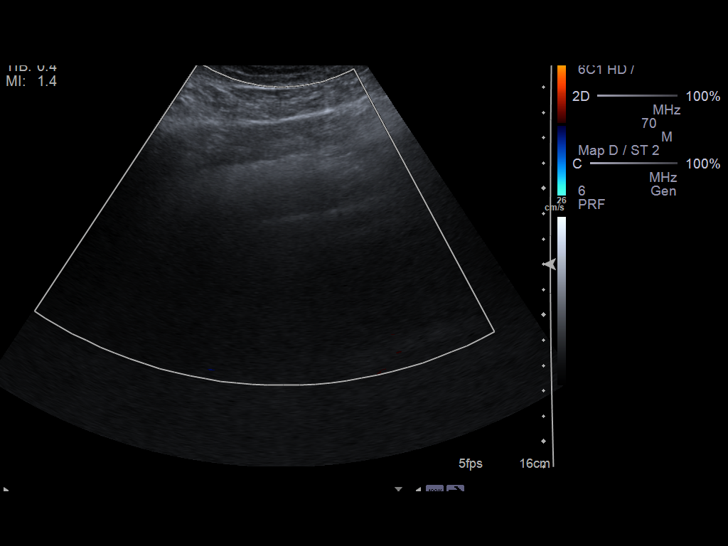
[im 24/30]
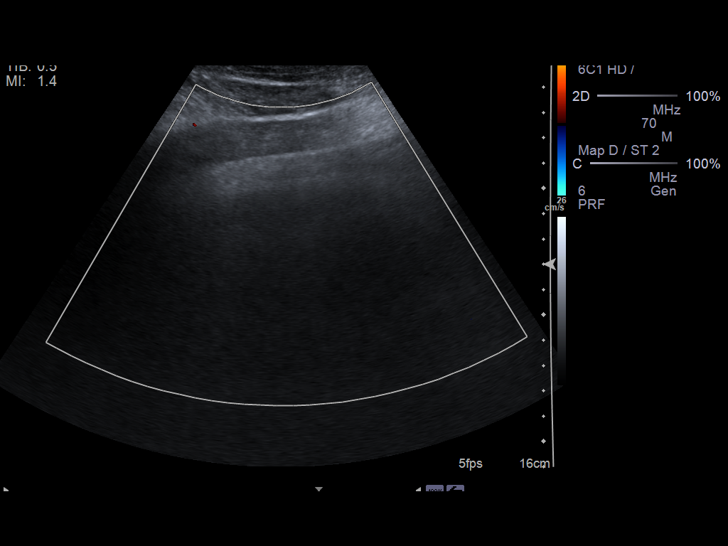
[im 27/30]
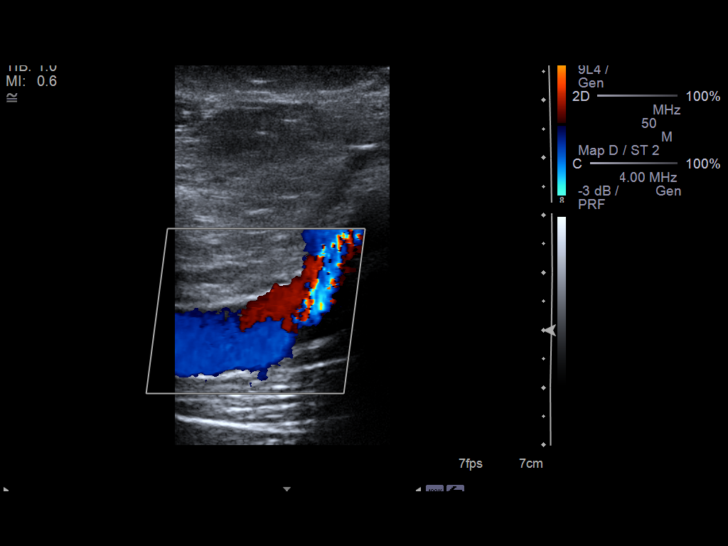
[im 30/30]
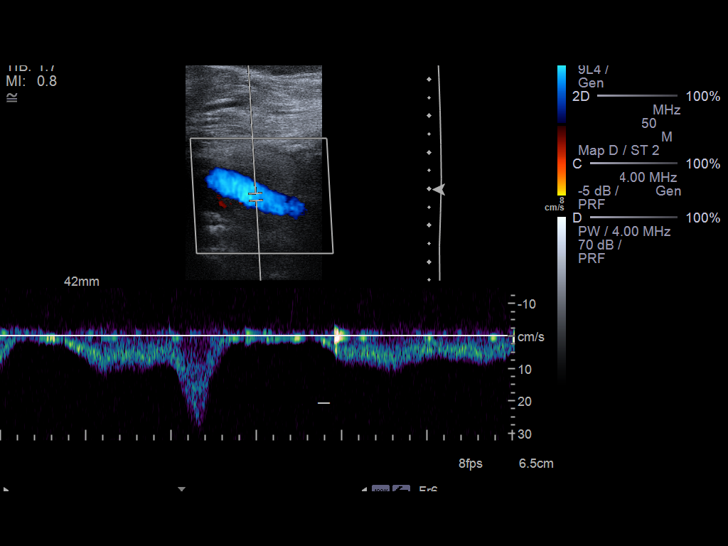

[14 of 24 positions shown; findings below may reference images not displayed]

FINDINGS: Thrombus within deep veins:  None visualized.

Compressibility of deep veins:  Normal.

Duplex waveform respiratory phasicity:  Normal.

Duplex waveform response to augmentation:  Normal.

Venous reflux:  None visualized.

Other findings: Posterior tibial and peroneal veins could not be
visualized.
IMPRESSION: Negative deep venous thrombosis of the right lower extremity.

## 2013-10-03 LAB — CBC WITH DIFFERENTIAL/PLATELET
Basophil #: 0.1 10*3/uL (ref 0.0–0.1)
Basophil #: 0.1 10*3/uL (ref 0.0–0.1)
Basophil %: 1.4 %
Basophil %: 1.1 %
Eosinophil #: 0.2 10*3/uL (ref 0.0–0.7)
Eosinophil #: 0.2 10*3/uL (ref 0.0–0.7)
Eosinophil %: 2.4 %
Eosinophil %: 2.4 %
HCT: 41.7 % (ref 40.0–52.0)
HCT: 41.9 % (ref 40.0–52.0)
HGB: 13.9 g/dL (ref 13.0–18.0)
HGB: 13.8 g/dL (ref 13.0–18.0)
Lymphocyte #: 2.8 10*3/uL (ref 1.0–3.6)
Lymphocyte #: 2.1 10*3/uL (ref 1.0–3.6)
Lymphocyte %: 27.5 %
Lymphocyte %: 23 %
MCH: 29.6 pg (ref 26.0–34.0)
MCH: 29.4 pg (ref 26.0–34.0)
MCHC: 33.3 g/dL (ref 32.0–36.0)
MCHC: 33 g/dL (ref 32.0–36.0)
MCV: 89 fL (ref 80–100)
MCV: 89 fL (ref 80–100)
Monocyte #: 0.8 x10 3/mm (ref 0.2–1.0)
Monocyte #: 0.6 x10 3/mm (ref 0.2–1.0)
Monocyte %: 7.7 %
Monocyte %: 6.6 %
Neutrophil #: 6.2 10*3/uL (ref 1.4–6.5)
Neutrophil #: 6.1 10*3/uL (ref 1.4–6.5)
Neutrophil %: 61 %
Neutrophil %: 66.9 %
Platelet: 204 10*3/uL (ref 150–440)
Platelet: 207 10*3/uL (ref 150–440)
RBC: 4.7 10*6/uL (ref 4.40–5.90)
RBC: 4.71 10*6/uL (ref 4.40–5.90)
RDW: 15.2 % — ABNORMAL HIGH (ref 11.5–14.5)
RDW: 15.2 % — ABNORMAL HIGH (ref 11.5–14.5)
WBC: 10.2 10*3/uL (ref 3.8–10.6)
WBC: 9.1 10*3/uL (ref 3.8–10.6)

## 2013-10-08 ENCOUNTER — Ambulatory Visit: Payer: Medicare Other | Admitting: Cardiology

## 2013-10-09 ENCOUNTER — Ambulatory Visit: Payer: Self-pay | Admitting: Gastroenterology

## 2013-10-13 ENCOUNTER — Telehealth: Payer: Self-pay | Admitting: Cardiology

## 2013-10-13 NOTE — Telephone Encounter (Signed)
Returned call to patient no answer.LMTC. 

## 2013-10-13 NOTE — Telephone Encounter (Signed)
New message         Pt wanted to let you know that he had a colonoscopy.

## 2013-10-14 ENCOUNTER — Encounter (HOSPITAL_COMMUNITY): Payer: Medicare Other

## 2013-10-20 ENCOUNTER — Telehealth: Payer: Self-pay | Admitting: Cardiology

## 2013-10-20 NOTE — Telephone Encounter (Signed)
The pt wants a letter listing all of his medical problems and his limitations as he states that the housing authority is telling him that his home is un kept and a danger to him. The pt is advised to contact his pcp for a note that lists all of his medical problems, not just his cardiac issues. He verbalized understanding.

## 2013-10-20 NOTE — Telephone Encounter (Signed)
New message  Patient has questions about upcoming testing. Please call back.

## 2013-10-28 ENCOUNTER — Ambulatory Visit: Payer: Medicare Other | Admitting: Cardiology

## 2013-10-31 ENCOUNTER — Emergency Department: Payer: Self-pay | Admitting: Emergency Medicine

## 2013-10-31 LAB — CBC WITH DIFFERENTIAL/PLATELET
Basophil #: 0.1 10*3/uL (ref 0.0–0.1)
Basophil %: 0.9 %
Eosinophil #: 0.5 10*3/uL (ref 0.0–0.7)
Eosinophil %: 4.3 %
HCT: 42.1 % (ref 40.0–52.0)
HGB: 13.6 g/dL (ref 13.0–18.0)
Lymphocyte #: 3.2 10*3/uL (ref 1.0–3.6)
Lymphocyte %: 28.1 %
MCH: 28.9 pg (ref 26.0–34.0)
MCHC: 32.3 g/dL (ref 32.0–36.0)
MCV: 89 fL (ref 80–100)
Monocyte #: 0.8 x10 3/mm (ref 0.2–1.0)
Monocyte %: 7.3 %
Neutrophil #: 6.7 10*3/uL — ABNORMAL HIGH (ref 1.4–6.5)
Neutrophil %: 59.4 %
Platelet: 215 10*3/uL (ref 150–440)
RBC: 4.72 10*6/uL (ref 4.40–5.90)
RDW: 15.2 % — ABNORMAL HIGH (ref 11.5–14.5)
WBC: 11.3 10*3/uL — ABNORMAL HIGH (ref 3.8–10.6)

## 2013-10-31 LAB — BASIC METABOLIC PANEL
Anion Gap: 7 (ref 7–16)
BUN: 14 mg/dL (ref 7–18)
Calcium, Total: 8.5 mg/dL (ref 8.5–10.1)
Chloride: 107 mmol/L (ref 98–107)
Co2: 25 mmol/L (ref 21–32)
Creatinine: 1.01 mg/dL (ref 0.60–1.30)
EGFR (African American): >60
EGFR (Non-African Amer.): >60
Glucose: 117 mg/dL — ABNORMAL HIGH (ref 65–99)
Osmolality: 279 (ref 275–301)
Potassium: 3.7 mmol/L (ref 3.5–5.1)
Sodium: 139 mmol/L (ref 136–145)

## 2013-10-31 LAB — TROPONIN I
Troponin-I: <0.02 ng/mL
Troponin-I: <0.02 ng/mL

## 2013-10-31 LAB — D-DIMER(ARMC): D-Dimer: 522 ng/ml

## 2013-10-31 IMAGING — CT CT ANGIO CHEST
2 of 6 series · 19 of 46 positions shown · IV contrast (APPLIED)
Comparison: Prior radiograph from [DATE]

CLINICAL DATA: Dyspnea with positive D-dimer

EXAM:
CT ANGIOGRAPHY CHEST WITH CONTRAST
TECHNIQUE: Multidetector CT imaging of the chest was performed using the
standard protocol during bolus administration of intravenous
contrast. Multiplanar CT image reconstructions and MIPs were
obtained to evaluate the vascular anatomy.
CONTRAST:  100 cc of Isovue 370.

[Series 6: pe thins 1.5 · axial · 0.76mm/px · z∈[-338,-115]mm · 16 of 208 slices shown]
[im 11/208  lung]
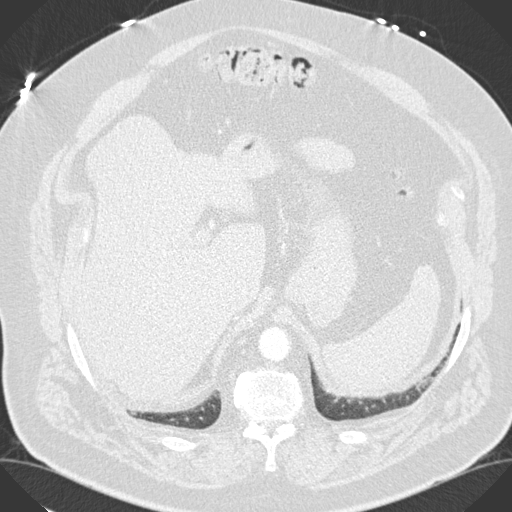
[im 22/208  soft-tissue]
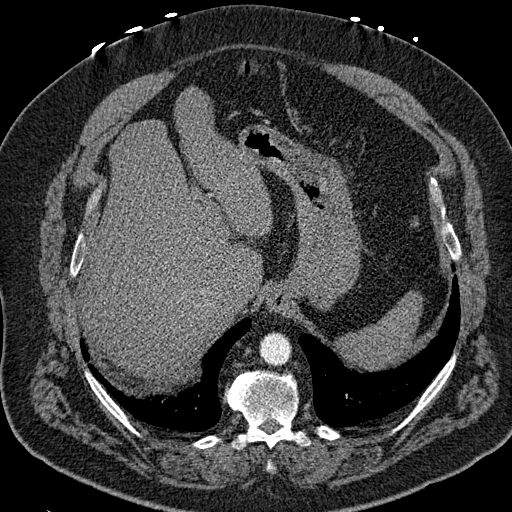
[im 33/208  lung]
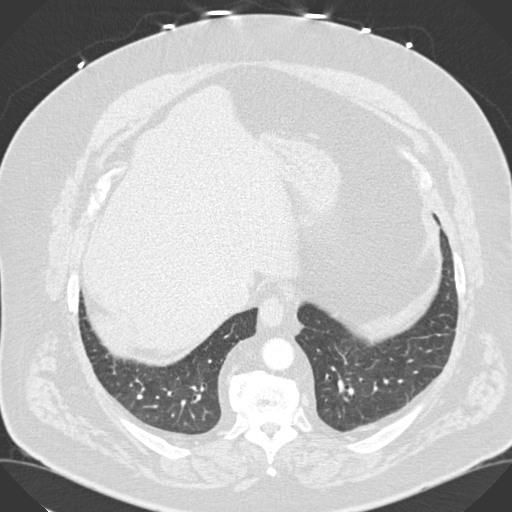
[im 44/208  soft-tissue]
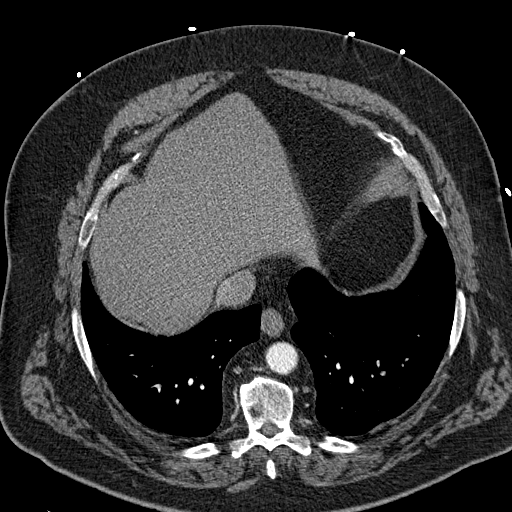
[im 66/208  lung]
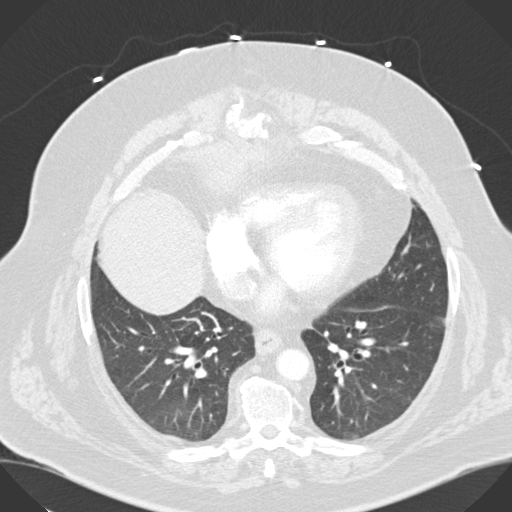
[im 77/208  soft-tissue]
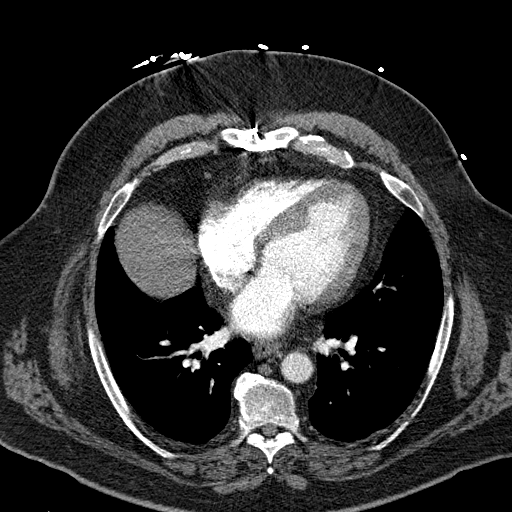
[im 88/208  lung]
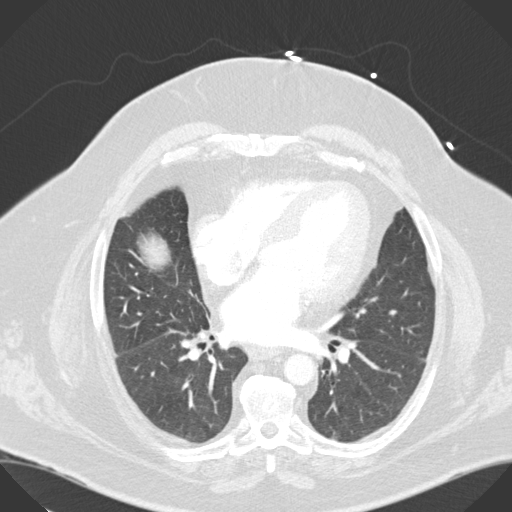
[im 99/208  soft-tissue]
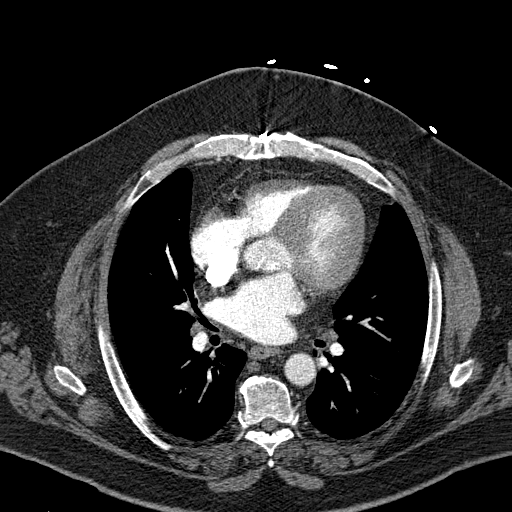
[im 109/208  lung]
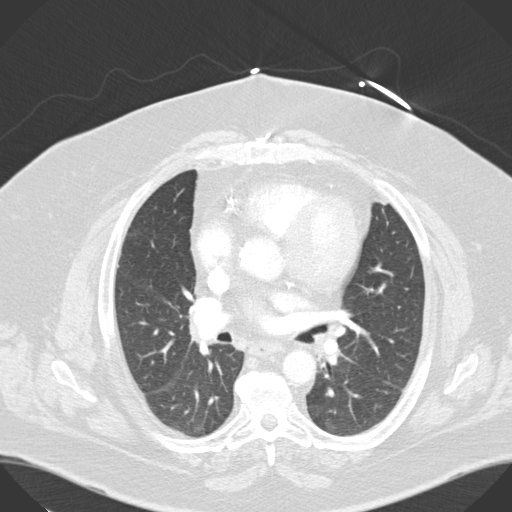
[im 120/208  soft-tissue]
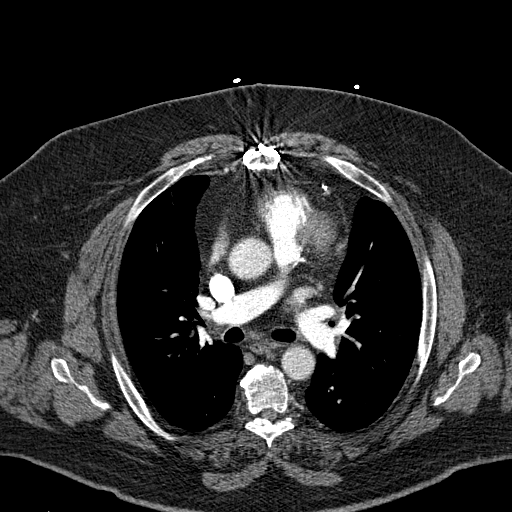
[im 131/208  lung]
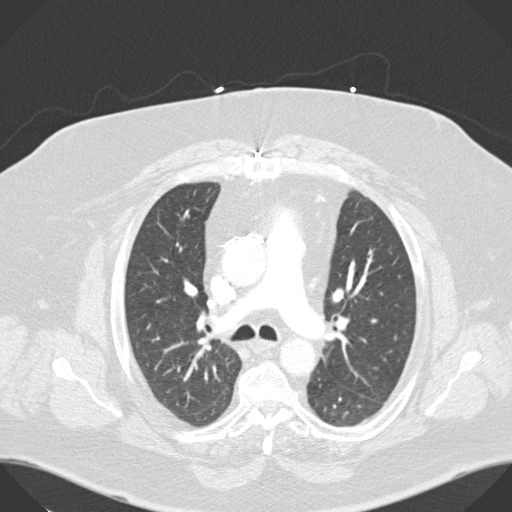
[im 142/208  soft-tissue]
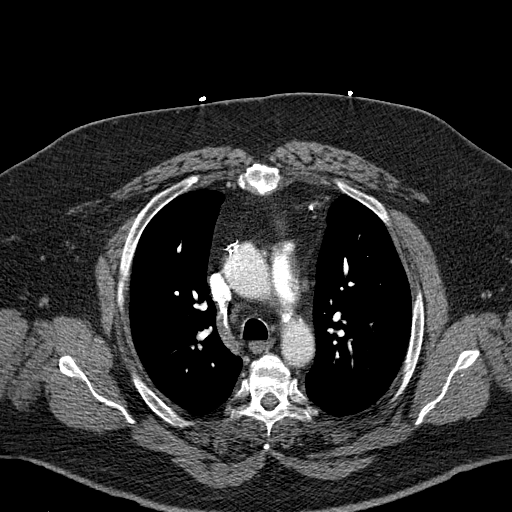
[im 164/208  lung]
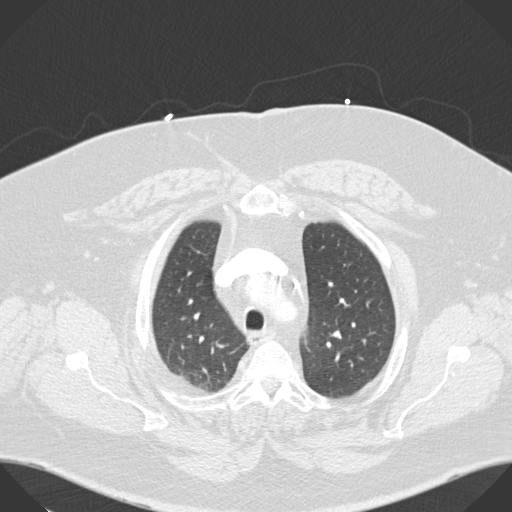
[im 175/208  soft-tissue]
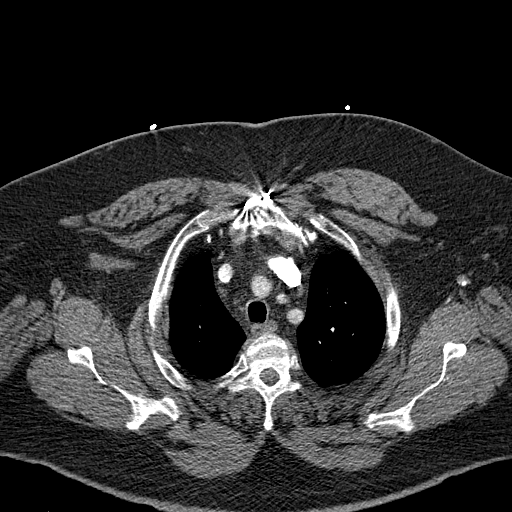
[im 186/208  lung]
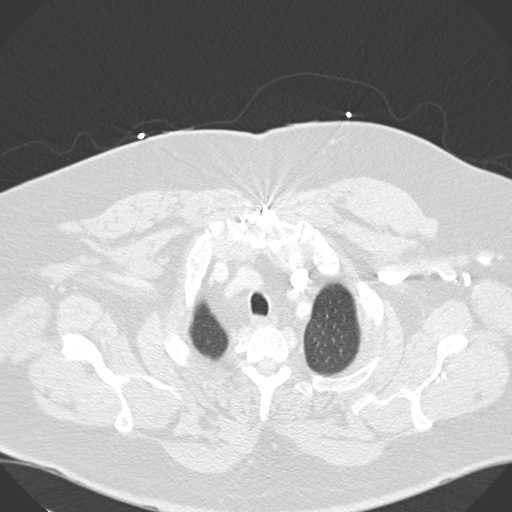
[im 197/208  soft-tissue]
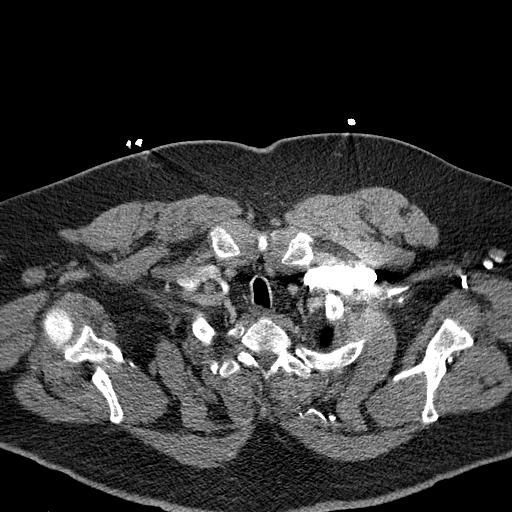

[Series 9: cor mpr 2.0 · coronal · 0.65mm/px · 3 of 134 slices shown]
[im 34/134  soft-tissue]
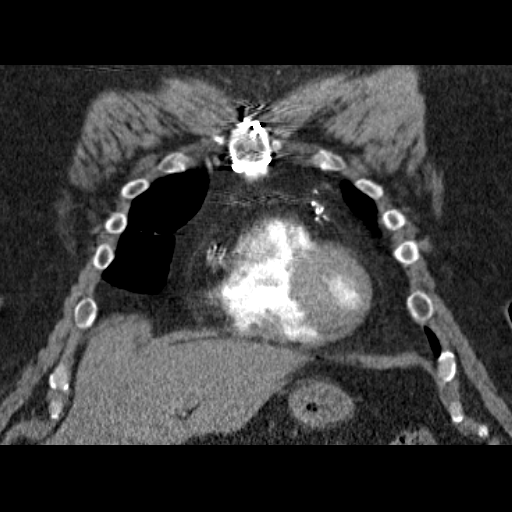
[im 67/134  soft-tissue]
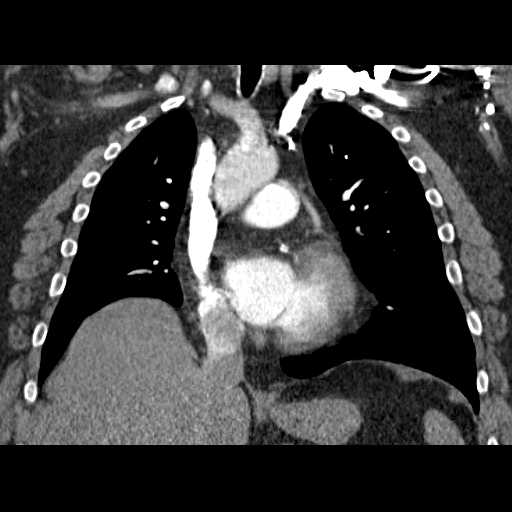
[im 100/134  soft-tissue]
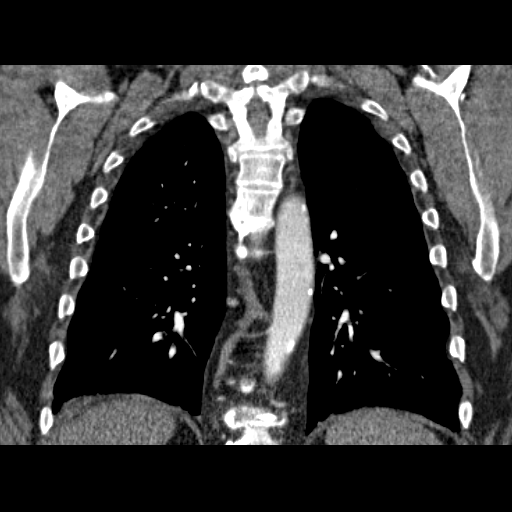

[19 of 46 positions shown; findings below may reference images not displayed]

FINDINGS: No pathologically enlarged mediastinal, hilar, or axillary lymph
nodes are identified.

Intrathoracic aorta is of normal caliber. Scattered atherosclerotic
plaque seen within the aortic arch. Great vessels are within normal
limits.

Cardiomegaly and present with sequelae of prior CABG. Diffuse 3
vessel coronary artery calcifications present. No pericardial
effusion.

Pulmonary arterial tree is well opacified. No filling defect to
suggest acute pulmonary embolism identified. Re-formatted imaging
confirms these findings.

The visualized lungs are clear without focal infiltrate, pulmonary
edema, or pleural effusion. Linear opacities within the anterior
right upper lobe are most compatible with atelectasis. Mild
subsegmental atelectasis also seen dependently within the lower
lobes. No pulmonary nodule or mass.

Small fat containing ventral hernia noted within the upper abdomen.
Visualized portions of the upper abdomen are otherwise within normal
limits.

Multilevel degenerative changes noted within the visualized spine.
No acute osseous abnormality. No worrisome lytic or blastic osseous
lesions.
IMPRESSION: 1. No CT evidence of acute pulmonary embolism.
2. No other acute cardiopulmonary abnormality identified within
chest.

## 2013-11-10 ENCOUNTER — Encounter (HOSPITAL_COMMUNITY): Payer: Medicare Other

## 2013-11-10 NOTE — Telephone Encounter (Signed)
Pt notified. Meds updated. Pt notified per Dr. Meda Coffee he can take zzzQuil.

## 2013-11-10 NOTE — Telephone Encounter (Signed)
Per pt t/c - has many complaints and questions.  Was extremely talkative and jumped from subject to subject.  Wants to make sure Dr Meda Coffee is aware he has had a GI bleed and was taken off ASA.  He reports having a normal endoscopy and a polyp removed during his colonoscopy. He never stated a reason for the GI bleed.  Pathology per his report on the polyp was "no cancer" He is asking when he should restart the ASA.  Advised GI MD should tell him but I will forward this information to Dr Meda Coffee.  States he was on Zoloft but he has weaned himself off because he doesn't know why he was on it and he feels like it was causing him insomnia.  He is taking OTC ZzzQuil and wants to know if that is OK for him to take.  It seems to be helping his sleep.  Medication list reviewed with patient and are marked per what he says he is taking.  Of note - he is not taking potassium chloride and furosemide because he "ran out of money"  He is requesting if Dr Meda Coffee wants him to remain on these medications as well as metoprolol [facility] [place].  He is aware he will be contacted once this information has been reviewed by Dr Meda Coffee and if there are any recommendation or changes to be made in his care.

## 2013-11-10 NOTE — Telephone Encounter (Signed)
New message     Patient has some questions regarding nuclear stress test. Need more information

## 2013-11-10 NOTE — Telephone Encounter (Signed)
He should restart aspirin 81 mg po daily and continue taking metoprolol. Its ok not to take Lasix and KCL, but if he starts developing worsening SOB, LE edema of if he gains 5 lbs in 1 week he needs to contact us.  Would you let him know?  Than you, [place] Siren

## 2013-11-11 ENCOUNTER — Encounter: Payer: Self-pay | Admitting: Cardiovascular Disease

## 2013-11-14 ENCOUNTER — Ambulatory Visit: Payer: Medicare Other | Admitting: Cardiology

## 2013-12-10 ENCOUNTER — Encounter (HOSPITAL_COMMUNITY): Payer: Medicare Other

## 2013-12-17 ENCOUNTER — Ambulatory Visit: Payer: Medicare Other | Admitting: Cardiology

## 2013-12-20 ENCOUNTER — Emergency Department: Payer: Self-pay | Admitting: Emergency Medicine

## 2013-12-20 LAB — BASIC METABOLIC PANEL
Anion Gap: 7 (ref 7–16)
BUN: 16 mg/dL (ref 7–18)
Calcium, Total: 8.7 mg/dL (ref 8.5–10.1)
Chloride: 103 mmol/L (ref 98–107)
Co2: 26 mmol/L (ref 21–32)
Creatinine: 0.96 mg/dL (ref 0.60–1.30)
EGFR (African American): >60
EGFR (Non-African Amer.): >60
Glucose: 119 mg/dL — ABNORMAL HIGH (ref 65–99)
Osmolality: 274 (ref 275–301)
Potassium: 3.8 mmol/L (ref 3.5–5.1)
Sodium: 136 mmol/L (ref 136–145)

## 2013-12-20 LAB — CBC
HCT: 44.1 % (ref 40.0–52.0)
HGB: 14 g/dL (ref 13.0–18.0)
MCH: 28.1 pg (ref 26.0–34.0)
MCHC: 31.8 g/dL — ABNORMAL LOW (ref 32.0–36.0)
MCV: 89 fL (ref 80–100)
Platelet: 236 10*3/uL (ref 150–440)
RBC: 4.99 10*6/uL (ref 4.40–5.90)
RDW: 15 % — ABNORMAL HIGH (ref 11.5–14.5)
WBC: 12.6 10*3/uL — ABNORMAL HIGH (ref 3.8–10.6)

## 2013-12-20 LAB — PRO B NATRIURETIC PEPTIDE: B-Type Natriuretic Peptide: 498 pg/mL — ABNORMAL HIGH (ref 0–125)

## 2013-12-20 LAB — TROPONIN I: Troponin-I: <0.02 ng/mL

## 2013-12-20 IMAGING — CR DG CHEST 2V
1 series · 2 of 2 positions shown · non-contrast
Comparison: CT ANGIO CHEST dated [DATE]; DG CHEST 2V dated
[DATE]

CLINICAL DATA: Weakness with difficulty breathing

EXAM:
CHEST  2 VIEW

[Series 7: w chest pa · 0.14mm/px · 2 of 2 slices shown]
[im 1/2]
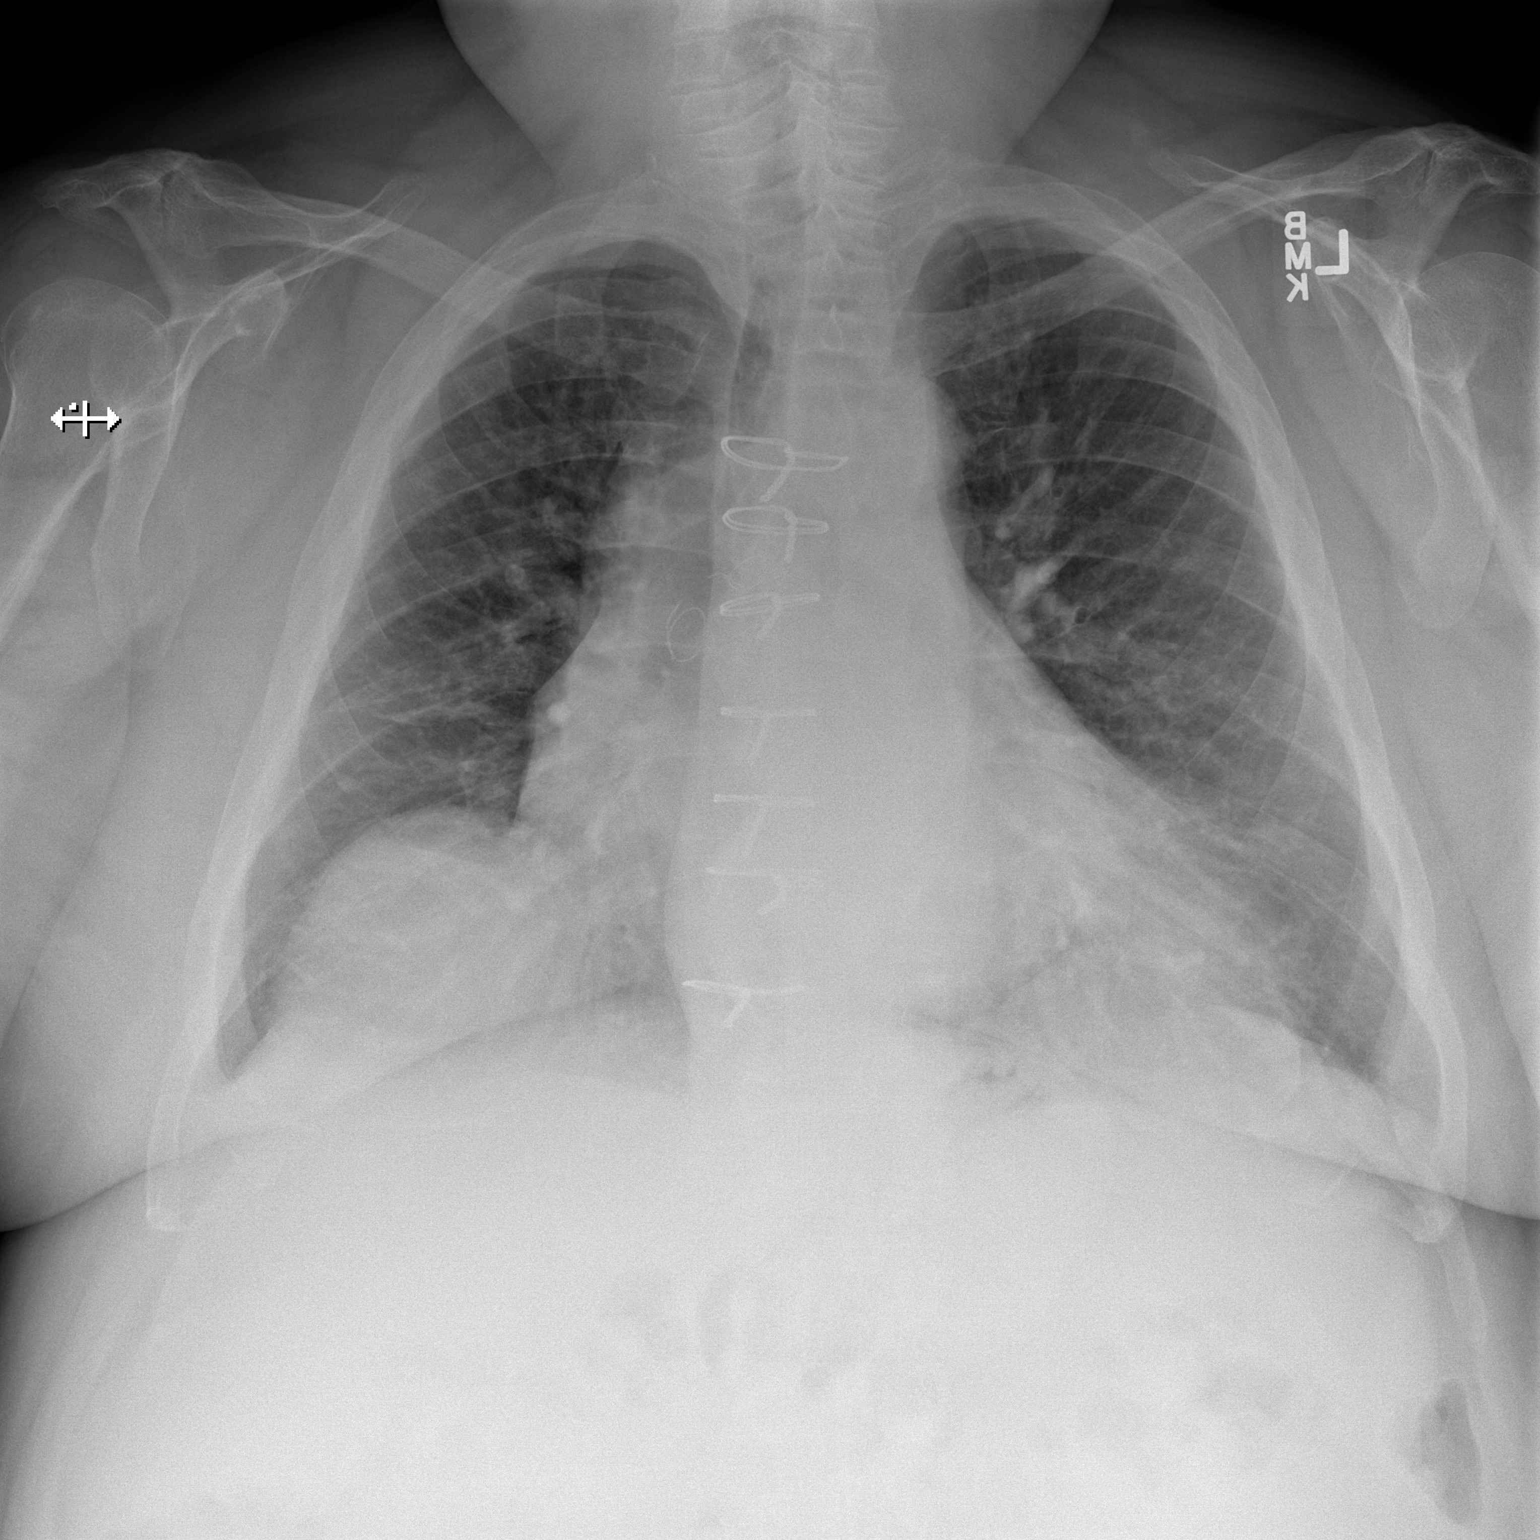
[im 2/2]
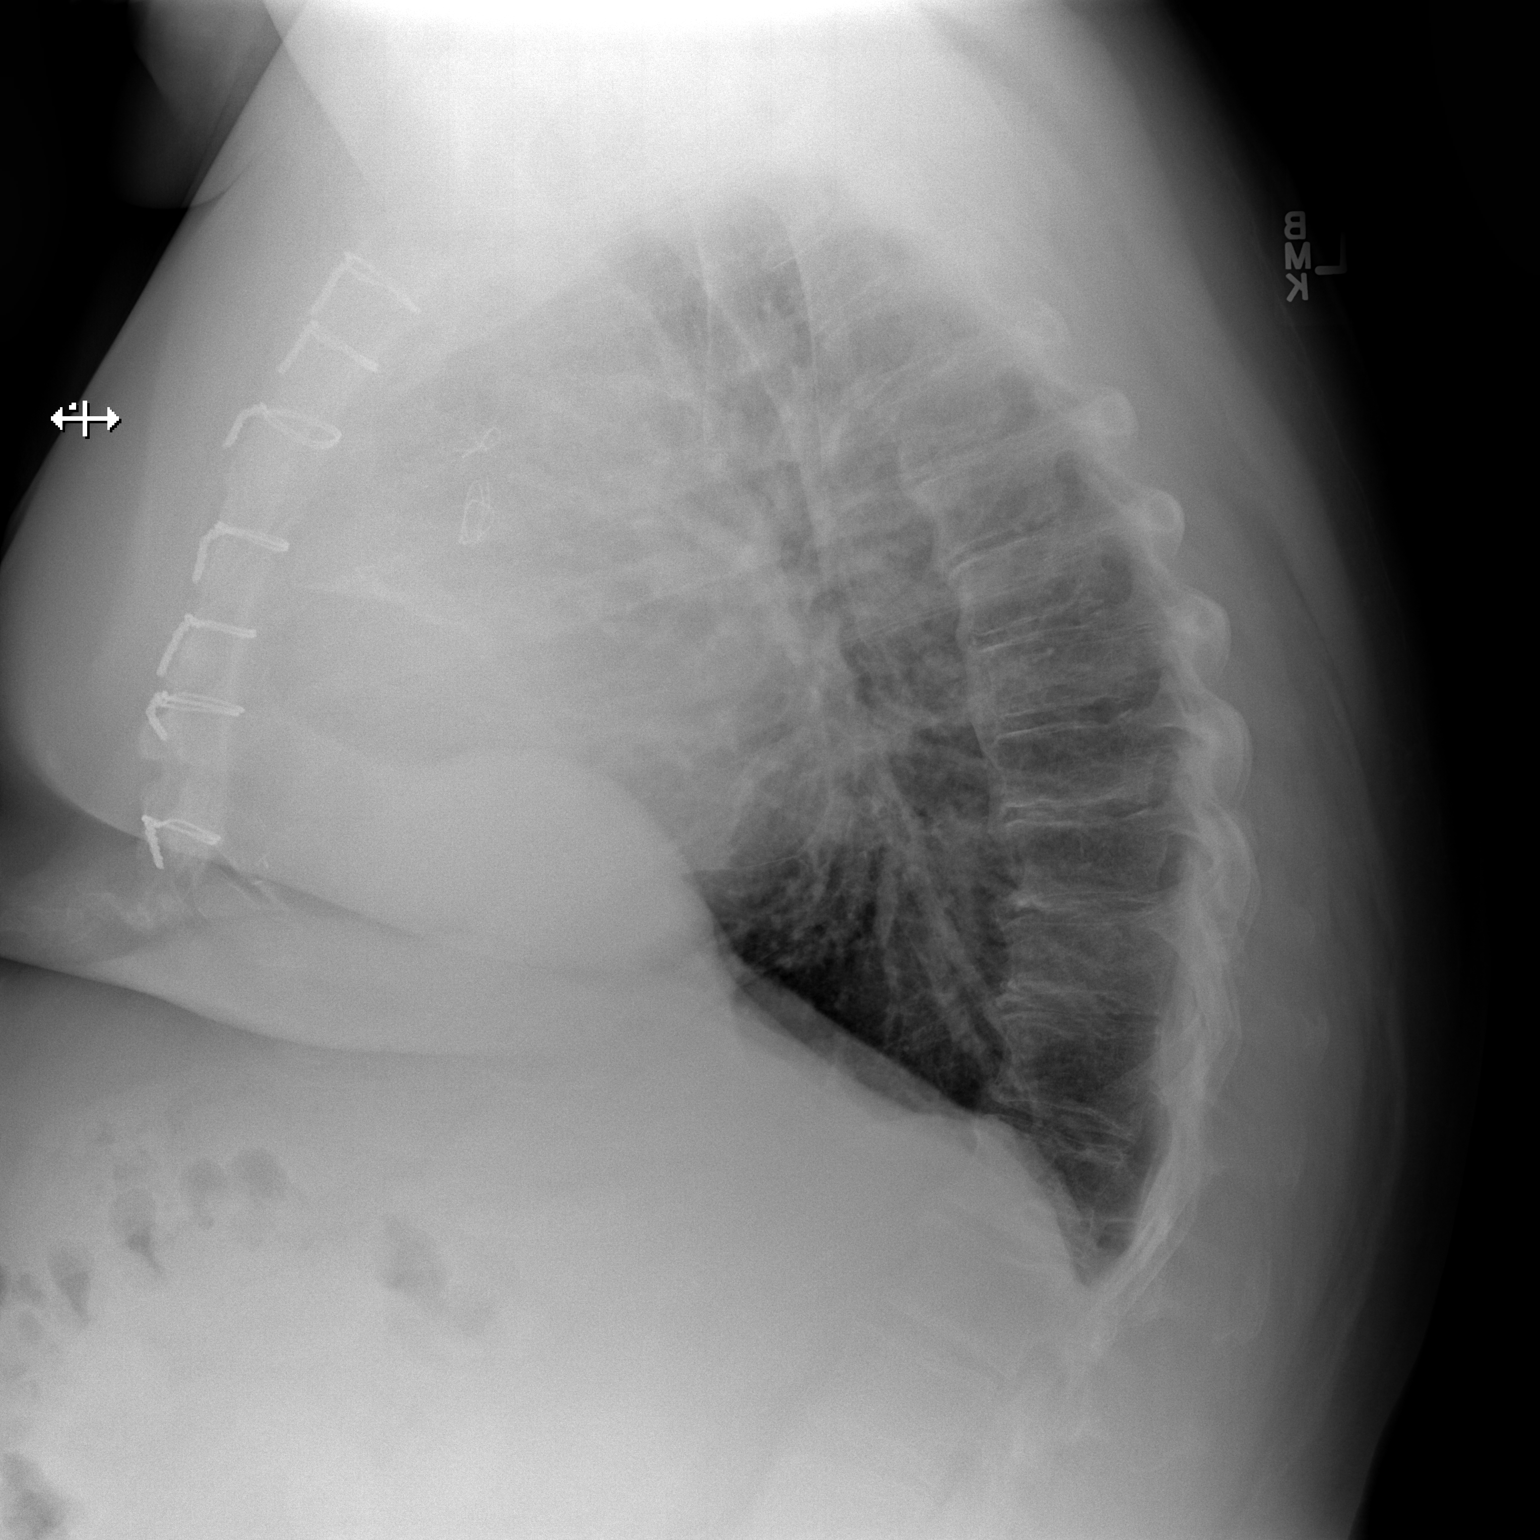

[2 of 2 positions shown; findings below may reference images not displayed]

FINDINGS: The lungs are adequately inflated. There is no focal infiltrate. The
pulmonary interstitial markings are mildly increased but not greatly
changed from the previous study. The cardiopericardial silhouette
remains enlarged. The pulmonary vascularity is not engorged. There
is no pleural effusion or pneumothorax. The patient has undergone
previous CABG. There are mild degenerative disc changes at multiple
thoracic levels.
IMPRESSION: There is stable enlargement of the cardiac silhouette with minimal
stable prominence of the pulmonary interstitial markings. One cannot
exclude low-grade compensated CHF in the appropriate clinical
setting. Alternatively acute bronchitis could be present.

## 2013-12-23 ENCOUNTER — Encounter (HOSPITAL_COMMUNITY): Payer: Medicare Other

## 2013-12-29 ENCOUNTER — Observation Stay: Payer: Self-pay | Admitting: Internal Medicine

## 2013-12-29 DIAGNOSIS — R0602 Shortness of breath: Secondary | ICD-10-CM

## 2013-12-29 LAB — URINALYSIS, COMPLETE
Bacteria: NONE SEEN
Bilirubin,UR: NEGATIVE
Blood: NEGATIVE
Glucose,UR: NEGATIVE mg/dL (ref 0–75)
Ketone: NEGATIVE
Leukocyte Esterase: NEGATIVE
Nitrite: NEGATIVE
Ph: 6 (ref 4.5–8.0)
Protein: NEGATIVE
RBC,UR: NONE SEEN /HPF (ref 0–5)
Specific Gravity: 1.009 (ref 1.003–1.030)
Squamous Epithelial: NONE SEEN
WBC UR: <1 /HPF (ref 0–5)

## 2013-12-29 LAB — CBC
HCT: 44.9 % (ref 40.0–52.0)
HGB: 14.7 g/dL (ref 13.0–18.0)
MCH: 29.1 pg (ref 26.0–34.0)
MCHC: 32.8 g/dL (ref 32.0–36.0)
MCV: 89 fL (ref 80–100)
Platelet: 235 10*3/uL (ref 150–440)
RBC: 5.07 10*6/uL (ref 4.40–5.90)
RDW: 15.2 % — ABNORMAL HIGH (ref 11.5–14.5)
WBC: 12.1 10*3/uL — ABNORMAL HIGH (ref 3.8–10.6)

## 2013-12-29 LAB — PRO B NATRIURETIC PEPTIDE: B-Type Natriuretic Peptide: 751 pg/mL — ABNORMAL HIGH (ref 0–125)

## 2013-12-29 LAB — COMPREHENSIVE METABOLIC PANEL
Albumin: 3.7 g/dL (ref 3.4–5.0)
Alkaline Phosphatase: 100 U/L
Anion Gap: 7 (ref 7–16)
BUN: 16 mg/dL (ref 7–18)
Bilirubin,Total: 0.5 mg/dL (ref 0.2–1.0)
Calcium, Total: 9 mg/dL (ref 8.5–10.1)
Chloride: 105 mmol/L (ref 98–107)
Co2: 26 mmol/L (ref 21–32)
Creatinine: 0.94 mg/dL (ref 0.60–1.30)
EGFR (African American): >60
EGFR (Non-African Amer.): >60
Glucose: 103 mg/dL — ABNORMAL HIGH (ref 65–99)
Osmolality: 277 (ref 275–301)
Potassium: 3.9 mmol/L (ref 3.5–5.1)
SGOT(AST): 36 U/L (ref 15–37)
SGPT (ALT): 36 U/L (ref 12–78)
Sodium: 138 mmol/L (ref 136–145)
Total Protein: 8 g/dL (ref 6.4–8.2)

## 2013-12-29 LAB — TROPONIN I
Troponin-I: <0.02 ng/mL
Troponin-I: <0.02 ng/mL
Troponin-I: <0.02 ng/mL

## 2013-12-29 LAB — CK TOTAL AND CKMB (NOT AT ARMC)
CK, Total: 438 U/L — ABNORMAL HIGH
CK-MB: 2.2 ng/mL (ref 0.5–3.6)

## 2013-12-29 IMAGING — US US EXTREM LOW VENOUS*R*
1 series · 14 of 24 positions shown · non-contrast
Comparison: [DATE]

CLINICAL DATA: Pain, edema, varicose/ spider veins

EXAM:
RIGHT LOWER EXTREMITY VENOUS DOPPLER ULTRASOUND
TECHNIQUE: Gray-scale sonography with compression, as well as color and duplex
ultrasound, were performed to evaluate the deep venous system from
the level of the common femoral vein through the popliteal and
proximal calf veins.

[Series 1: us extrem low venous*right* · 0.09mm/px · 14 of 33 slices shown]
[im 1/33]
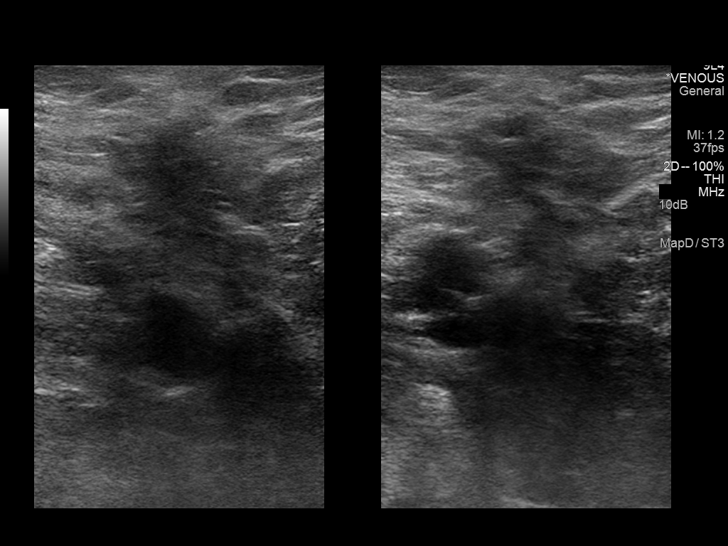
[im 3/33]
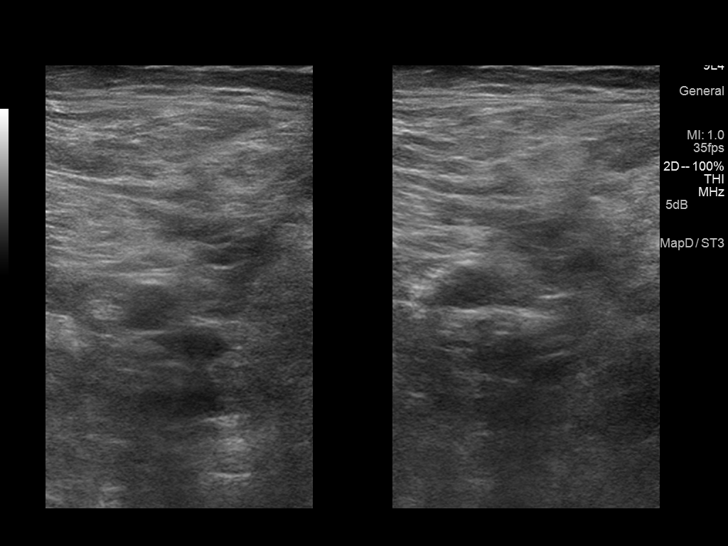
[im 6/33]
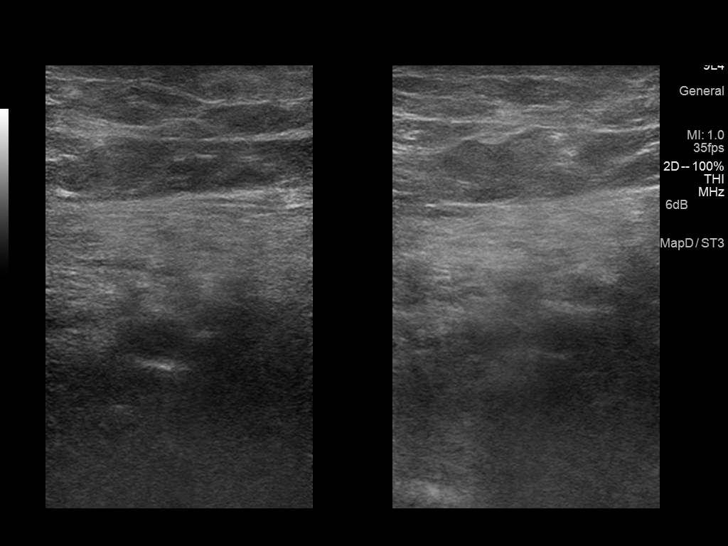
[im 9/33]
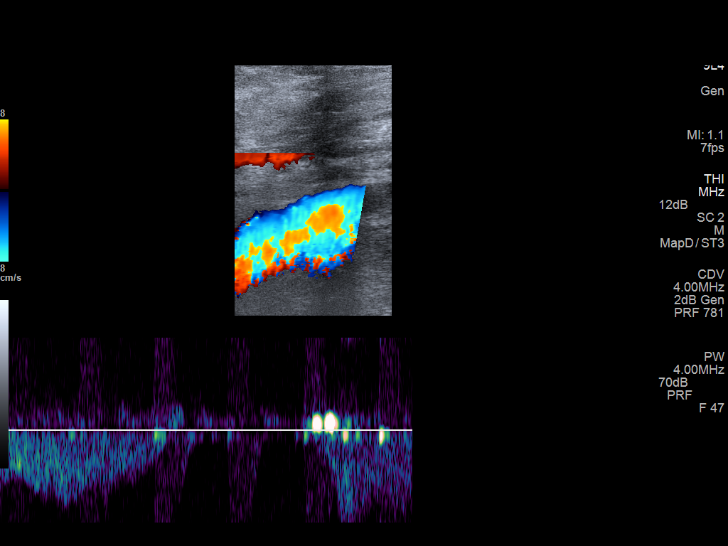
[im 10/33]
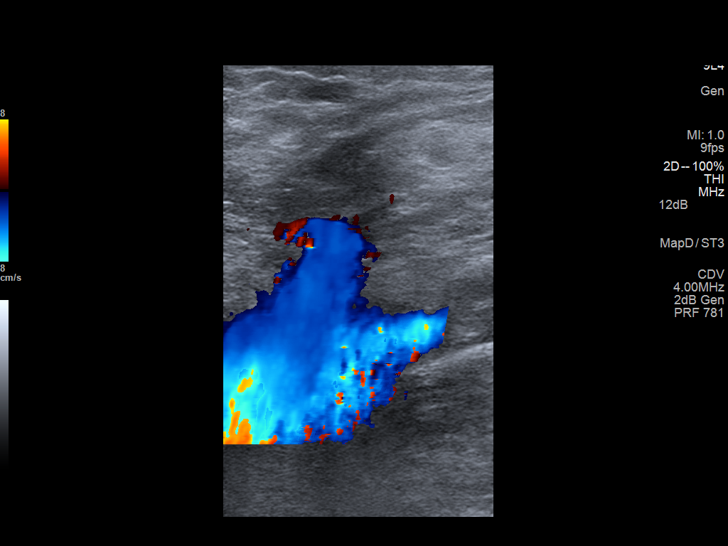
[im 13/33]
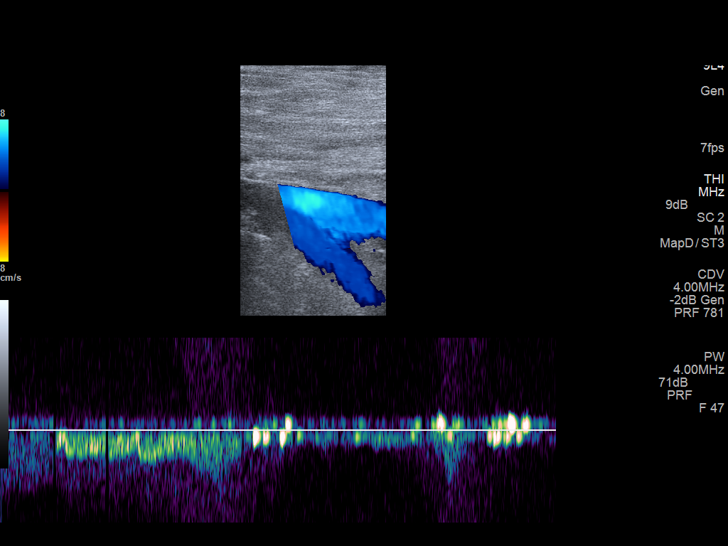
[im 16/33]
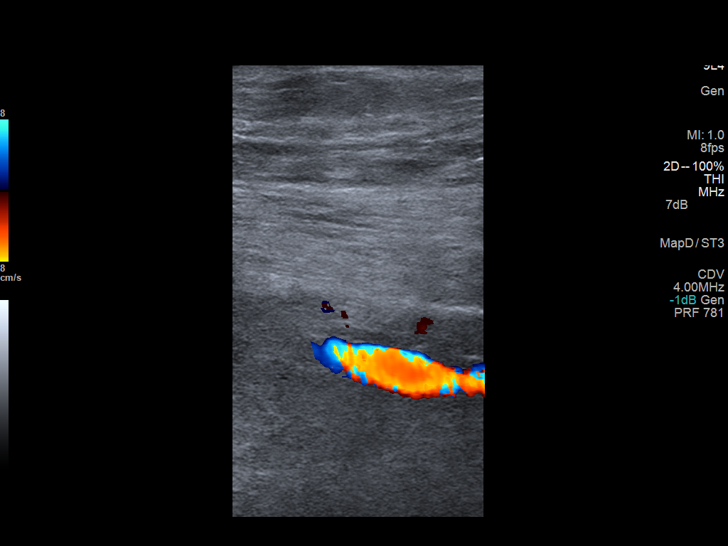
[im 17/33]
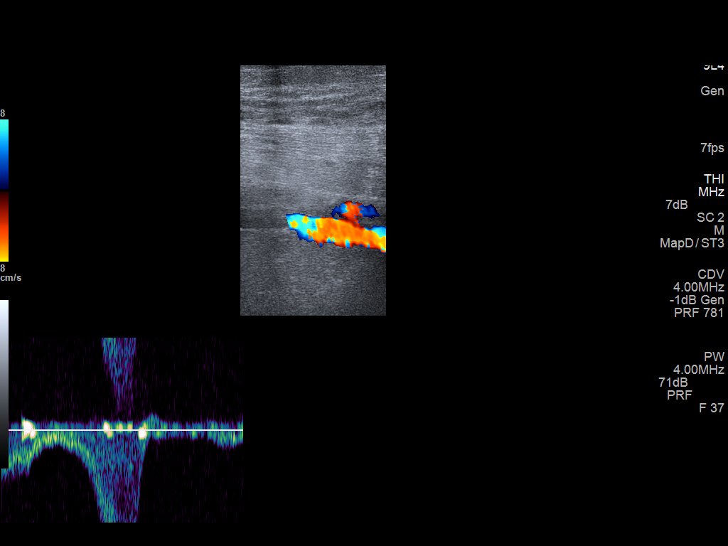
[im 20/33]
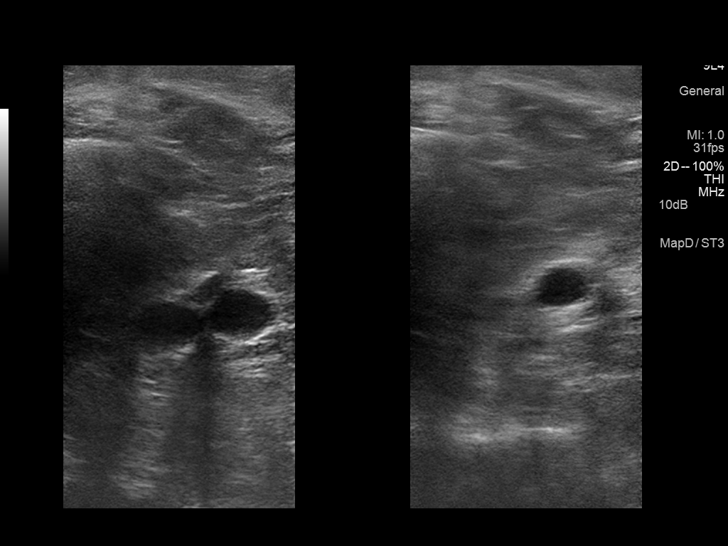
[im 23/33]
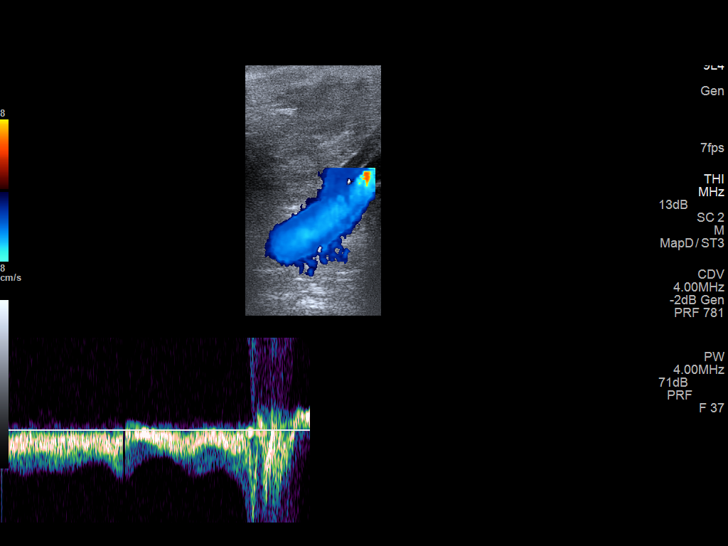
[im 26/33]
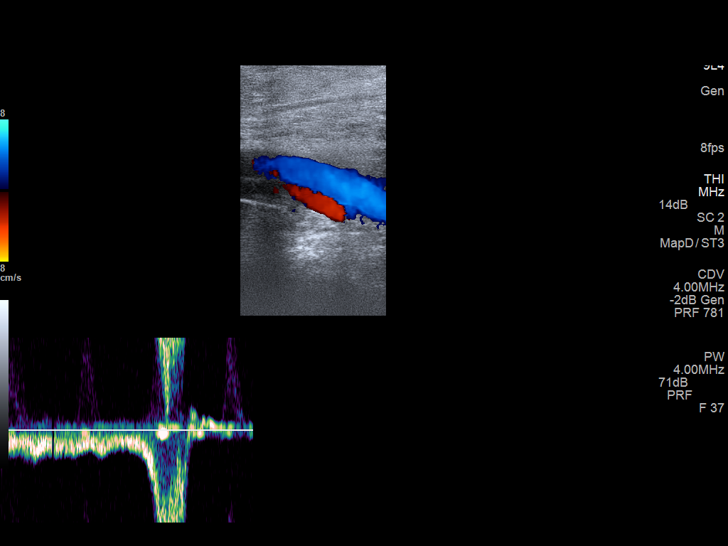
[im 27/33]
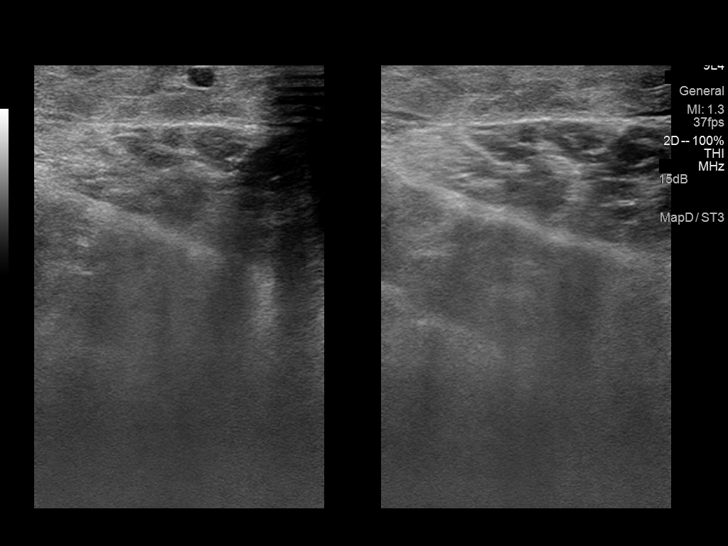
[im 30/33]
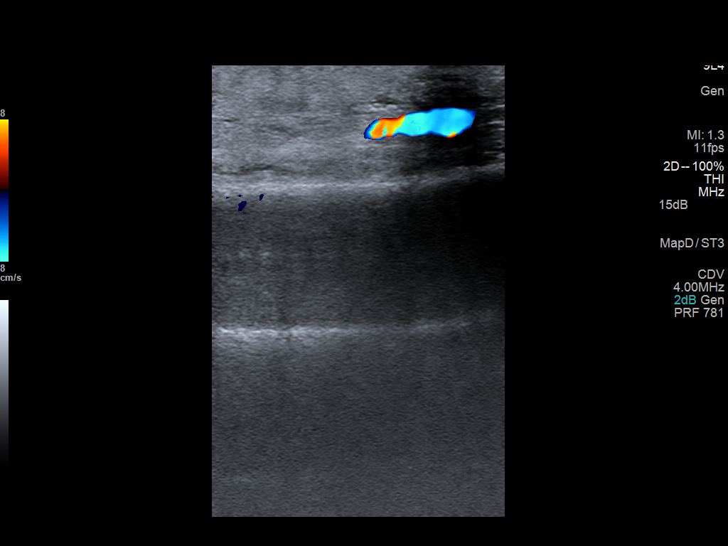
[im 33/33]
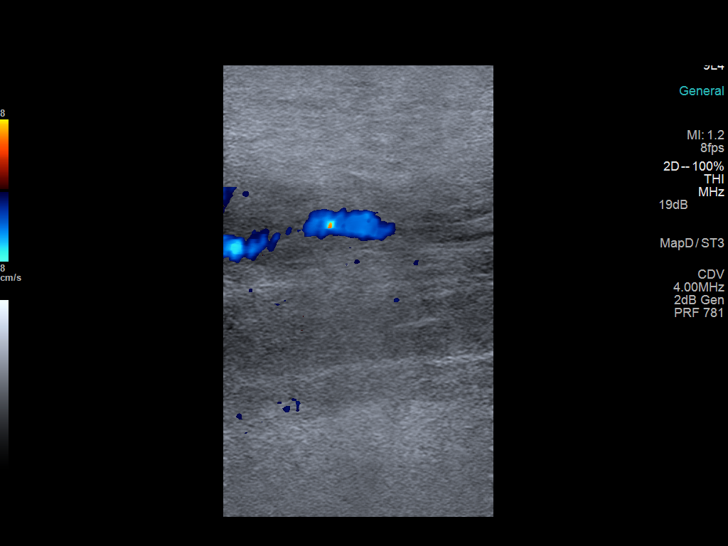

[14 of 24 positions shown; findings below may reference images not displayed]

FINDINGS: Normal compressibility of the common femoral, superficial femoral,
and popliteal veins, as well as the proximal calf veins. No filling
defects to suggest DVT on grayscale or color Doppler imaging.
Doppler waveforms show normal direction of venous flow, normal
respiratory phasicity and response to augmentation. Visualized
segments of the saphenous venous system normal in caliber and
compressibility.
IMPRESSION: No evidence of RIGHT lower extremity deep vein thrombosis.

## 2013-12-29 IMAGING — CR DG CHEST 1V PORT
1 series · 1 of 1 positions shown · non-contrast
Comparison: [DATE]

CLINICAL DATA: Short of breath.

EXAM:
PORTABLE CHEST - 1 VIEW

[ap]
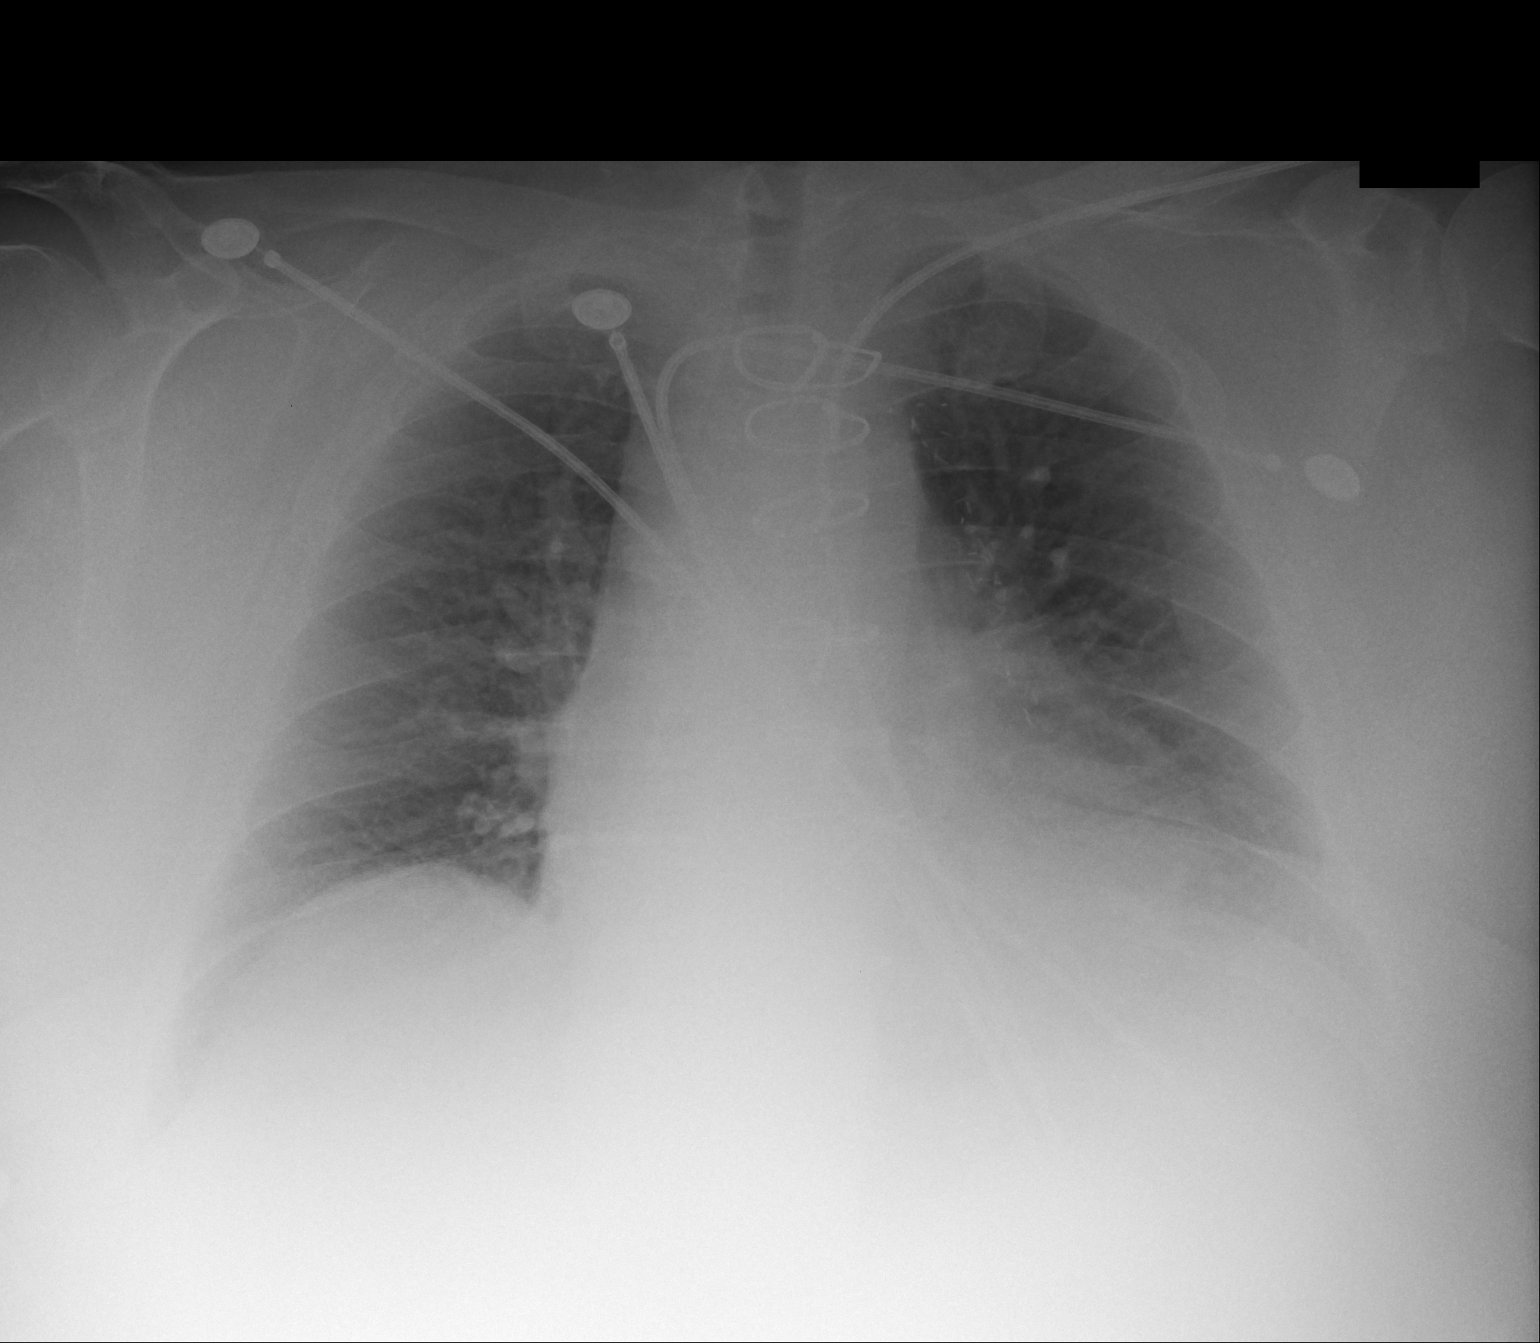

[1 of 1 positions shown; findings below may reference images not displayed]

FINDINGS: Changes from cardiac surgery are stable. Cardiac silhouette is
normal in size. Normal mediastinal and hilar contours. Clear lungs.
Bony thorax is demineralized but intact.
IMPRESSION: No acute cardiopulmonary disease.

## 2013-12-30 DIAGNOSIS — I5033 Acute on chronic diastolic (congestive) heart failure: Secondary | ICD-10-CM

## 2013-12-30 DIAGNOSIS — I10 Essential (primary) hypertension: Secondary | ICD-10-CM

## 2013-12-30 DIAGNOSIS — R0789 Other chest pain: Secondary | ICD-10-CM

## 2013-12-30 LAB — BASIC METABOLIC PANEL
Anion Gap: 5 — ABNORMAL LOW (ref 7–16)
BUN: 17 mg/dL (ref 7–18)
Calcium, Total: 9 mg/dL (ref 8.5–10.1)
Chloride: 102 mmol/L (ref 98–107)
Co2: 28 mmol/L (ref 21–32)
Creatinine: 0.99 mg/dL (ref 0.60–1.30)
EGFR (African American): >60
EGFR (Non-African Amer.): >60
Glucose: 108 mg/dL — ABNORMAL HIGH (ref 65–99)
Osmolality: 272 (ref 275–301)
Potassium: 3.4 mmol/L — ABNORMAL LOW (ref 3.5–5.1)
Sodium: 135 mmol/L — ABNORMAL LOW (ref 136–145)

## 2013-12-30 LAB — LIPID PANEL
Cholesterol: 194 mg/dL (ref 0–200)
HDL Cholesterol: 42 mg/dL (ref 40–60)
Ldl Cholesterol, Calc: 120 mg/dL — ABNORMAL HIGH (ref 0–100)
Triglycerides: 158 mg/dL (ref 0–200)
VLDL Cholesterol, Calc: 32 mg/dL (ref 5–40)

## 2013-12-30 LAB — CBC WITH DIFFERENTIAL/PLATELET
Basophil #: 0.1 10*3/uL (ref 0.0–0.1)
Basophil %: 0.7 %
Eosinophil #: 0.3 10*3/uL (ref 0.0–0.7)
Eosinophil %: 2.4 %
HCT: 45.8 % (ref 40.0–52.0)
HGB: 14.4 g/dL (ref 13.0–18.0)
Lymphocyte #: 3.1 10*3/uL (ref 1.0–3.6)
Lymphocyte %: 28 %
MCH: 27.8 pg (ref 26.0–34.0)
MCHC: 31.4 g/dL — ABNORMAL LOW (ref 32.0–36.0)
MCV: 89 fL (ref 80–100)
Monocyte #: 0.9 x10 3/mm (ref 0.2–1.0)
Monocyte %: 8 %
Neutrophil #: 6.7 10*3/uL — ABNORMAL HIGH (ref 1.4–6.5)
Neutrophil %: 60.9 %
Platelet: 227 10*3/uL (ref 150–440)
RBC: 5.16 10*6/uL (ref 4.40–5.90)
RDW: 15.1 % — ABNORMAL HIGH (ref 11.5–14.5)
WBC: 11 10*3/uL — ABNORMAL HIGH (ref 3.8–10.6)

## 2014-01-05 ENCOUNTER — Ambulatory Visit: Payer: Medicare Other | Admitting: Cardiology

## 2014-01-05 ENCOUNTER — Telehealth: Payer: Self-pay

## 2014-01-05 NOTE — Telephone Encounter (Signed)
Attempted to contact pt regarding discharge from [facility] on 12/30/13. Left message for pt to call back.

## 2014-01-06 NOTE — Telephone Encounter (Signed)
Patient contacted regarding discharge from [facility] on 12/30/13.  Patient understands to follow up with Ignacia Bayley, NP on 01/08/14 at 10:45 at [facility]. Patient understands discharge instructions? yes Patient understands medications and regiment? yes Patient understands to bring all medications to this visit? yes  Pt inquires if he will continue to see Dr. Meda Coffee in [place].  He is a former pt of Dr. Verl Blalock.

## 2014-01-08 ENCOUNTER — Encounter: Payer: Medicare Other | Admitting: Cardiovascular Disease

## 2014-01-09 ENCOUNTER — Encounter: Payer: Self-pay | Admitting: Cardiovascular Disease

## 2014-01-09 ENCOUNTER — Ambulatory Visit (INDEPENDENT_AMBULATORY_CARE_PROVIDER_SITE_OTHER): Payer: Medicare Other | Admitting: Cardiovascular Disease

## 2014-01-09 ENCOUNTER — Encounter (INDEPENDENT_AMBULATORY_CARE_PROVIDER_SITE_OTHER): Payer: Self-pay

## 2014-01-09 VITALS — BP 130/70 | HR 67 | Ht 65.0 in | Wt 283.2 lb

## 2014-01-09 DIAGNOSIS — E782 Mixed hyperlipidemia: Secondary | ICD-10-CM

## 2014-01-09 DIAGNOSIS — R0989 Other specified symptoms and signs involving the circulatory and respiratory systems: Secondary | ICD-10-CM

## 2014-01-09 DIAGNOSIS — I25810 Atherosclerosis of coronary artery bypass graft(s) without angina pectoris: Secondary | ICD-10-CM

## 2014-01-09 DIAGNOSIS — R0609 Other forms of dyspnea: Secondary | ICD-10-CM

## 2014-01-09 DIAGNOSIS — I5032 Chronic diastolic (congestive) heart failure: Secondary | ICD-10-CM

## 2014-01-09 DIAGNOSIS — I10 Essential (primary) hypertension: Secondary | ICD-10-CM

## 2014-01-09 DIAGNOSIS — I509 Heart failure, unspecified: Secondary | ICD-10-CM

## 2014-01-09 NOTE — Assessment & Plan Note (Signed)
Underlying shortness of breath likely multifactorial including diastolic CHF, obesity, deconditioning. Previous he was noncompliant with his diuretic.

## 2014-01-09 NOTE — Assessment & Plan Note (Signed)
Blood pressure is well controlled on today's visit. No changes made to the medications. 

## 2014-01-09 NOTE — Assessment & Plan Note (Signed)
Recommended that he continue on simvastatin. Goal LDL less than 70

## 2014-01-09 NOTE — Progress Notes (Signed)
Patient ID: Evan [name], male    DOB: [birth year], 58 y.o.   MRN: [id number]  HPI Comments: Evan [name] is a 58 year old gentleman with morbid obesity, chronic diastolic CHF,  coronary artery disease with bypass surgery in 2009 with 6 vessel bypass, numerous stenting dating back to the early 2000 , on disability . He has chronic right lower extremity swelling. History of obstructive sleep apnea, uses CPAP nightly History of anxiety and obsessive-compulsive disorder  History of medication noncompliance secondary to financial issues   Previously advised to have a stress test February 2015 but did not followup secondary to financial issues He presented to the hospital Dec 30 2013 with cough,  lower extremity edema. He was treated with gentle diuresis and discharged on Lasix 40 mg daily. Unable to exclude viral URI as he had some wheezing and was started on inhalers by internal medicine. Zoloft dosing was increased in the hospital as it was felt he was having some anxiety attacks  Echocardiogram may 25th 2015 showing ejection fraction 50-55%, unable to estimate right ventricular systolic pressure No evidence of DVT on the right  Since his discharge, he reports that he has felt well. Continues to take Lasix 40 mg daily for fluid retention and shortness of breath. He does not do any regular exercise. Weight continues to be a major issue. Also having skin issues with erythema on his legs, abdomen, pectoral area  Surgical details he has LIMA to the LAD, vein graft to the diagonal, vein graft to the OM, vein graft to the ramus, vein graft to OM 2 and vein graft to the distal RCA  EKG today shows normal sinus rhythm with rate 67 beats per minute with possible old inferior MI    Outpatient Encounter Prescriptions as of 01/09/2014  Medication Sig  . albuterol (PROVENTIL) (2.5 MG/3ML) 0.083% nebulizer solution Take 2.5 mg by nebulization every 6 (six) hours as needed for wheezing or shortness of breath.   Marland Kitchen aspirin 81 MG EC tablet Take 81 mg by mouth 2 (two) times daily.   Marland Kitchen esomeprazole (NEXIUM) 40 MG capsule Take 40 mg by mouth daily at 12 noon.  . fluticasone (FLONASE) 50 MCG/ACT nasal spray Place 1 spray into both nostrils daily.   . furosemide (LASIX) 40 MG tablet Take 40 mg by mouth daily.   . metoprolol tartrate (LOPRESSOR) 25 MG tablet Take 25 mg by mouth 2 (two) times daily.    . polyethylene glycol powder (GLYCOLAX/MIRALAX) powder Take 1 Container by mouth daily.   Marland Kitchen PROAIR HFA 108 (90 BASE) MCG/ACT inhaler Inhale 1 puff into the lungs every 4 (four) hours as needed.   . ranitidine (ZANTAC) 150 MG capsule Take 150 mg by mouth 2 (two) times daily.   . simvastatin (ZOCOR) 40 MG tablet Take 40 mg by mouth daily.    Review of Systems  Constitutional: Negative.   HENT: Negative.   Eyes: Negative.   Respiratory: Positive for shortness of breath.   Cardiovascular: Positive for leg swelling.  Gastrointestinal: Negative.   Endocrine: Negative.   Musculoskeletal: Negative.   Skin: Negative.   Allergic/Immunologic: Negative.   Neurological: Negative.   Hematological: Negative.   Psychiatric/Behavioral: Negative.   All other systems reviewed and are negative.   BP 130/70  Pulse 67  Ht 5\' 5"  (1.651 m)  Wt 283 lb 4 oz (128.481 kg)  BMI 47.14 kg/m2   Physical Exam  Nursing note and vitals reviewed. Constitutional: He is oriented to person, place, and time.  He appears well-developed and well-nourished.  Obese  HENT:  Head: Normocephalic.  Nose: Nose normal.  Mouth/Throat: Oropharynx is clear and moist.  Eyes: Conjunctivae are normal. Pupils are equal, round, and reactive to light.  Neck: Normal range of motion. Neck supple. No JVD present.  Cardiovascular: Normal rate, regular rhythm, S1 normal, S2 normal, normal heart sounds and intact distal pulses.  Exam reveals no gallop and no friction rub.   No murmur heard. Right lower extremity edema with trace to 1+ pitting edema to  below the knee  Pulmonary/Chest: Effort normal and breath sounds normal. No respiratory distress. He has no wheezes. He has no rales. He exhibits no tenderness.  Abdominal: Soft. Bowel sounds are normal. He exhibits no distension. There is no tenderness.  Musculoskeletal: Normal range of motion. He exhibits no edema and no tenderness.  Lymphadenopathy:    He has no cervical adenopathy.  Neurological: He is alert and oriented to person, place, and time. Coordination normal.  Skin: Skin is warm and dry. No rash noted. No erythema.  Psychiatric: He has a normal mood and affect. His behavior is normal. Judgment and thought content normal.      Assessment and Plan

## 2014-01-09 NOTE — Patient Instructions (Signed)
You are doing well. No medication changes were made. Continue on your lasix  We will place a consult to the Heart Failure Clinic at [facility]  Consider wearing a compression hose on the right leg  Please call us if you have new issues that need to be addressed before your next appt.  Your physician wants you to follow-up in: 3 months.  You will receive a reminder letter in the mail two months in advance. If you don't receive a letter, please call our office to schedule the follow-up appointment.

## 2014-01-09 NOTE — Assessment & Plan Note (Addendum)
Recommended that he stay on Lasix 40 mg daily. He drinks significant water daily. High risk of recurrent CHF given his obstructive sleep apnea, obesity hypoventilation. He is very anxious, likely need close monitoring. We'll place a consult for CHF clinic at [facility]

## 2014-01-09 NOTE — Assessment & Plan Note (Signed)
We have encouraged continued exercise, careful diet management in an effort to lose weight. 

## 2014-01-09 NOTE — Assessment & Plan Note (Signed)
No recent episodes of chest pain. It was recommended he have outpatient Myoview in [place] 2015 by [facility]. He did not followup. He reports he does not have the money at this time.

## 2014-01-15 ENCOUNTER — Telehealth: Payer: Self-pay | Admitting: Cardiology

## 2014-01-15 NOTE — Telephone Encounter (Signed)
Error

## 2014-01-19 ENCOUNTER — Telehealth: Payer: Self-pay | Admitting: Cardiology

## 2014-01-19 NOTE — Telephone Encounter (Signed)
Please see papers in refill bin / tgs

## 2014-01-20 ENCOUNTER — Other Ambulatory Visit: Payer: Self-pay

## 2014-01-20 MED ORDER — RANITIDINE HCL 150 MG PO CAPS: 150.0000 mg | ORAL_CAPSULE | Freq: Two times a day (BID) | ORAL | Status: DC

## 2014-01-20 MED ORDER — SIMVASTATIN 40 MG PO TABS: 40.0000 mg | ORAL_TABLET | Freq: Every day | ORAL | Status: DC

## 2014-02-02 ENCOUNTER — Emergency Department: Payer: Self-pay | Admitting: Emergency Medicine

## 2014-02-02 IMAGING — CR DG CHEST 2V
1 series · 2 of 2 positions shown · non-contrast
Comparison: [DATE]

CLINICAL DATA: Difficulty breathing.  Cough and sore throat.

EXAM:
CHEST  2 VIEW

[Series 1: pa · 0.17mm/px · 2 of 2 slices shown]
[im 1/2]
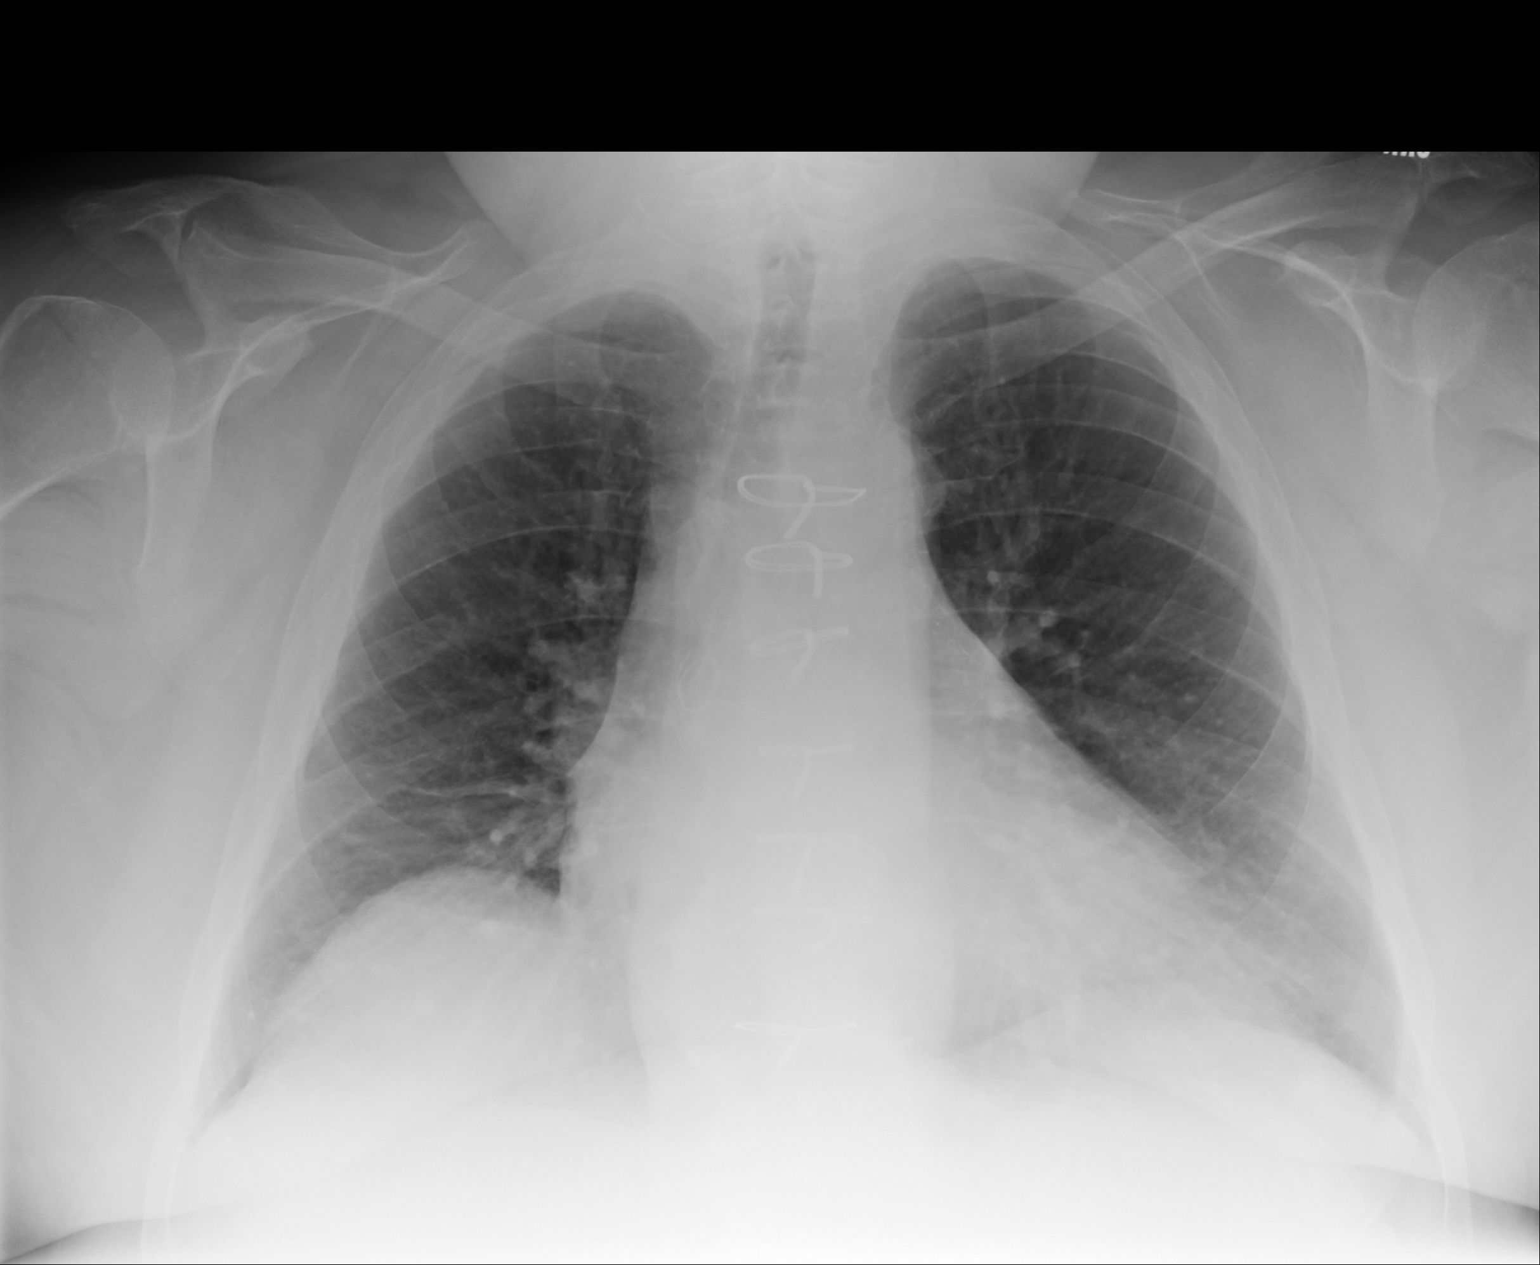
[im 2/2]
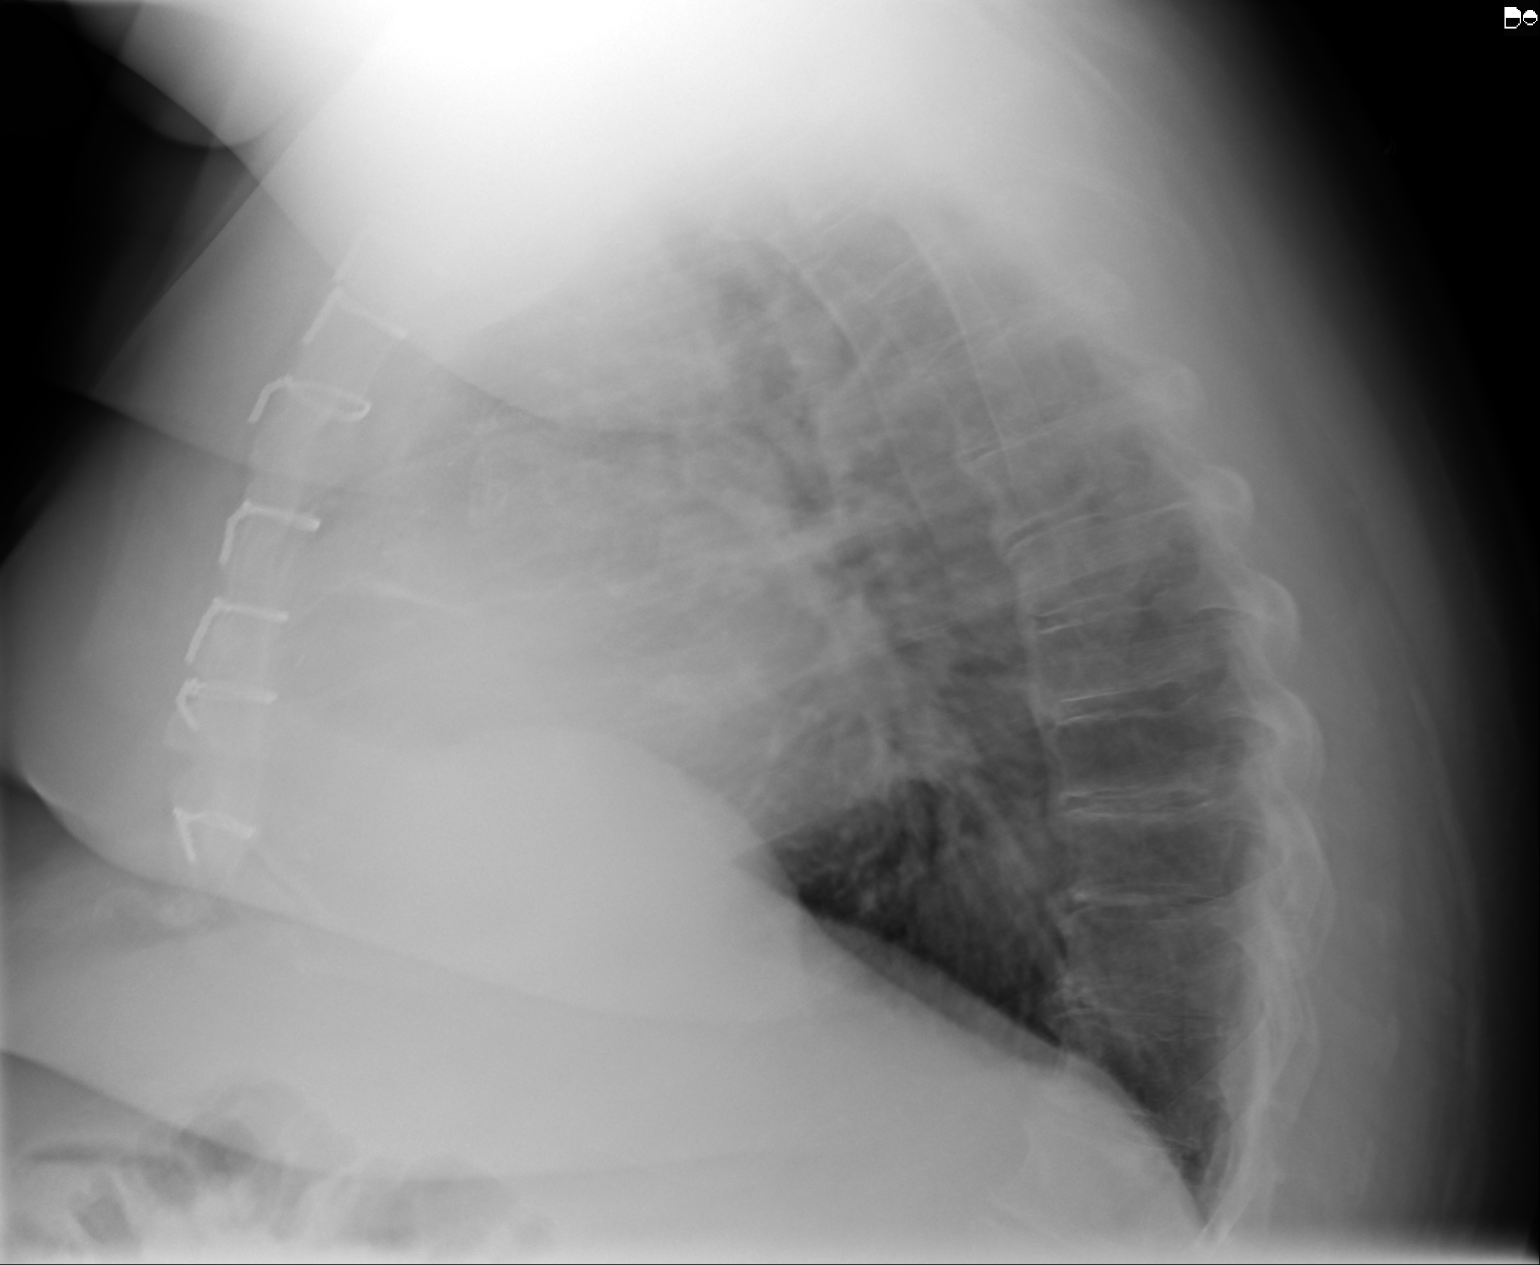

[2 of 2 positions shown; findings below may reference images not displayed]

FINDINGS: Previous median sternotomy and CABG procedure. Mild cardiac
enlargement. Both lungs are clear. The visualized skeletal
structures are unremarkable.
IMPRESSION: No active cardiopulmonary disease.

## 2014-02-13 ENCOUNTER — Telehealth: Payer: Self-pay | Admitting: *Deleted

## 2014-02-13 NOTE — Telephone Encounter (Signed)
Spoke w/ pt.  He reports that he is having leg weakness and SOB off and on for the past 2 weeks, coughing up some phlegm.  Saw PCP yesterday, diagnosed w/ ear infection and given abx.   States that he thinks his K+ is low, as he has had cramps in his calves when walking to mailbox.  He states that he has not been to the HF clinic yet, as "it's been hot and I don't have A/C in my car".   Spoke w/ Dr. [name].  Advised pt that sx are most likely r/t ear infection that may have moved to his sinuses.  Advised him to rest this weekend and let the abx work, to eat some citrus fruit and/or bananas.  If symptoms do not resolve, pt to call on Monday.

## 2014-02-13 NOTE — Telephone Encounter (Signed)
Patient called and is having weak legs, can barely make it to mailbox, difficulty swallowing and sweating a lot.

## 2014-03-05 ENCOUNTER — Inpatient Hospital Stay: Payer: Self-pay | Admitting: Internal Medicine

## 2014-03-05 LAB — URINALYSIS, COMPLETE
Bacteria: NONE SEEN
Bilirubin,UR: NEGATIVE
Blood: NEGATIVE
Glucose,UR: NEGATIVE mg/dL (ref 0–75)
Hyaline Cast: 3
Ketone: NEGATIVE
Leukocyte Esterase: NEGATIVE
Nitrite: NEGATIVE
Ph: 5 (ref 4.5–8.0)
Protein: NEGATIVE
RBC,UR: <1 /HPF (ref 0–5)
Specific Gravity: 1.021 (ref 1.003–1.030)
Squamous Epithelial: <1
WBC UR: 2 /HPF (ref 0–5)

## 2014-03-05 LAB — CBC
HCT: 43.6 % (ref 40.0–52.0)
HGB: 13.9 g/dL (ref 13.0–18.0)
MCH: 28.5 pg (ref 26.0–34.0)
MCHC: 31.9 g/dL — ABNORMAL LOW (ref 32.0–36.0)
MCV: 89 fL (ref 80–100)
Platelet: 213 10*3/uL (ref 150–440)
RBC: 4.88 10*6/uL (ref 4.40–5.90)
RDW: 15.6 % — ABNORMAL HIGH (ref 11.5–14.5)
WBC: 10.3 10*3/uL (ref 3.8–10.6)

## 2014-03-05 LAB — COMPREHENSIVE METABOLIC PANEL
Albumin: 3.3 g/dL — ABNORMAL LOW (ref 3.4–5.0)
Alkaline Phosphatase: 87 U/L
Anion Gap: 8 (ref 7–16)
BUN: 16 mg/dL (ref 7–18)
Bilirubin,Total: 0.5 mg/dL (ref 0.2–1.0)
Calcium, Total: 8.6 mg/dL (ref 8.5–10.1)
Chloride: 106 mmol/L (ref 98–107)
Co2: 25 mmol/L (ref 21–32)
Creatinine: 1.05 mg/dL (ref 0.60–1.30)
EGFR (African American): >60
EGFR (Non-African Amer.): >60
Glucose: 115 mg/dL — ABNORMAL HIGH (ref 65–99)
Osmolality: 280 (ref 275–301)
Potassium: 4 mmol/L (ref 3.5–5.1)
SGOT(AST): 41 U/L — ABNORMAL HIGH (ref 15–37)
SGPT (ALT): 41 U/L
Sodium: 139 mmol/L (ref 136–145)
Total Protein: 7.3 g/dL (ref 6.4–8.2)

## 2014-03-05 LAB — PRO B NATRIURETIC PEPTIDE: B-Type Natriuretic Peptide: 341 pg/mL — ABNORMAL HIGH (ref 0–125)

## 2014-03-05 LAB — TROPONIN I: Troponin-I: <0.02 ng/mL

## 2014-03-05 IMAGING — US US EXTREM LOW VENOUS*R*
1 series · 13 of 24 positions shown · non-contrast
Comparison: [DATE]

CLINICAL DATA: Right leg swelling. Pain and redness. Edema.
Varicose veins. Anticoagulation therapy.



[Series 1: us extrem low venous*right* · 0.09mm/px · 13 of 33 slices shown]
[im 1/33]
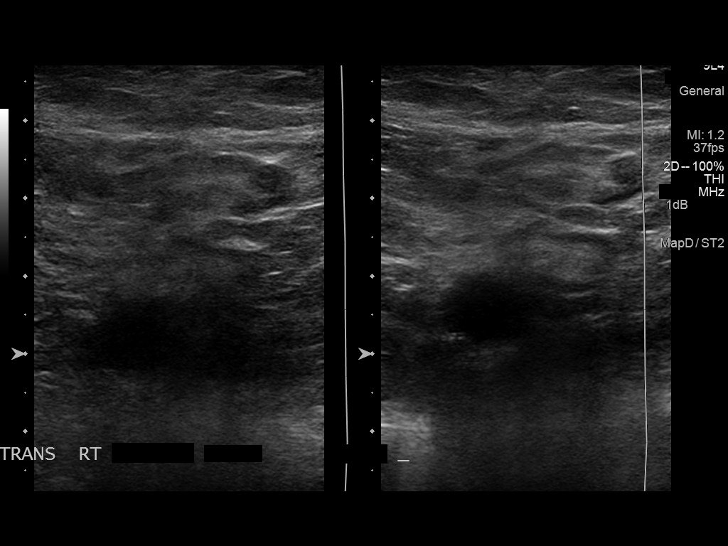
[im 3/33]
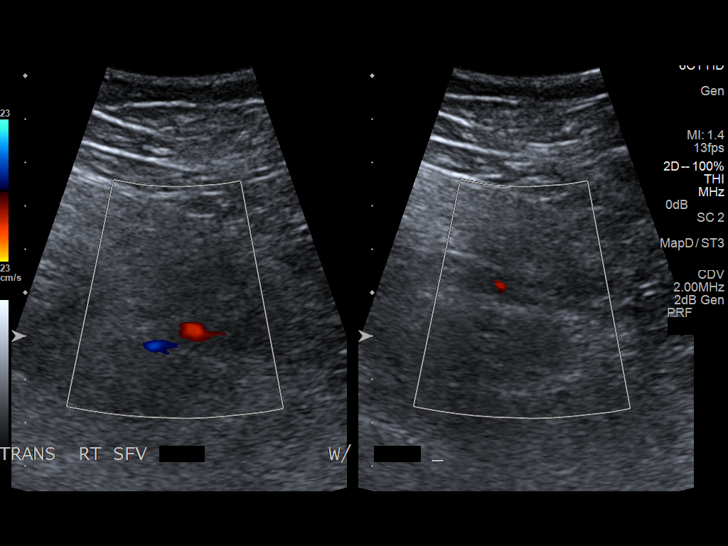
[im 6/33]
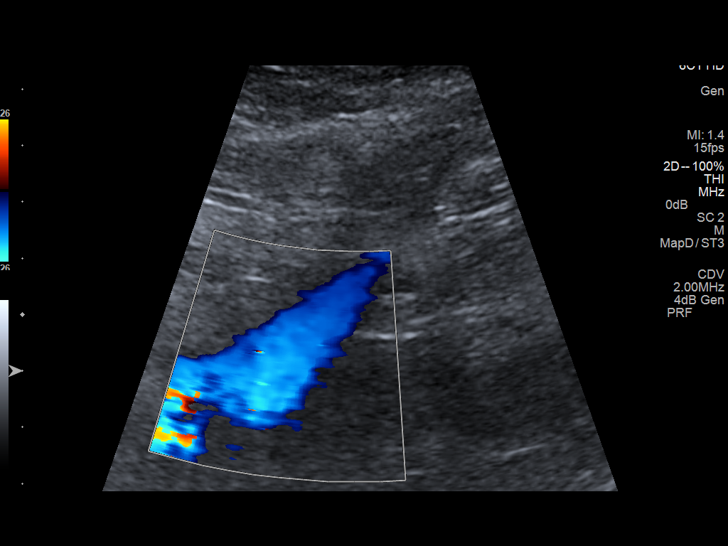
[im 9/33]
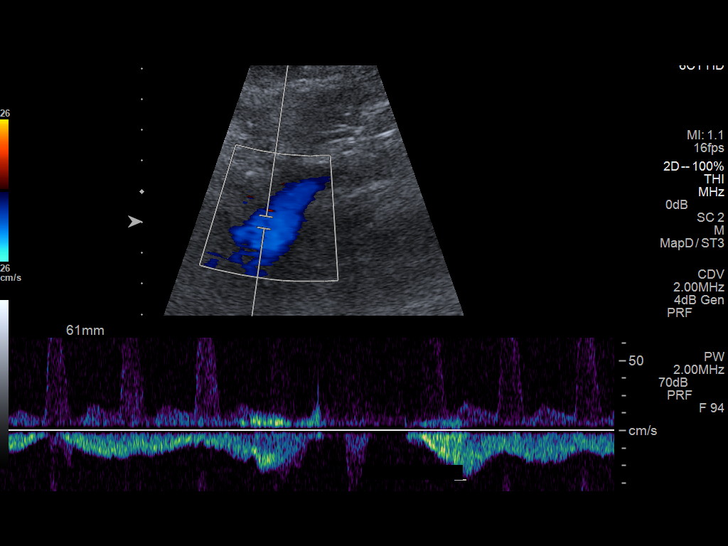
[im 12/33]
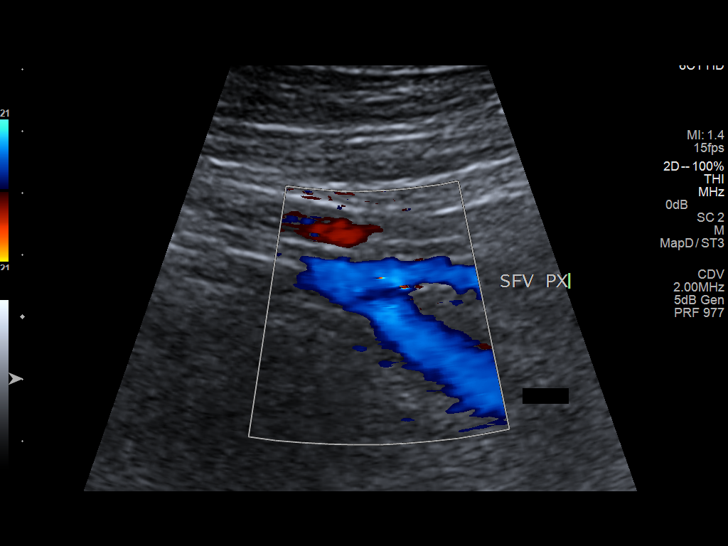
[im 14/33]
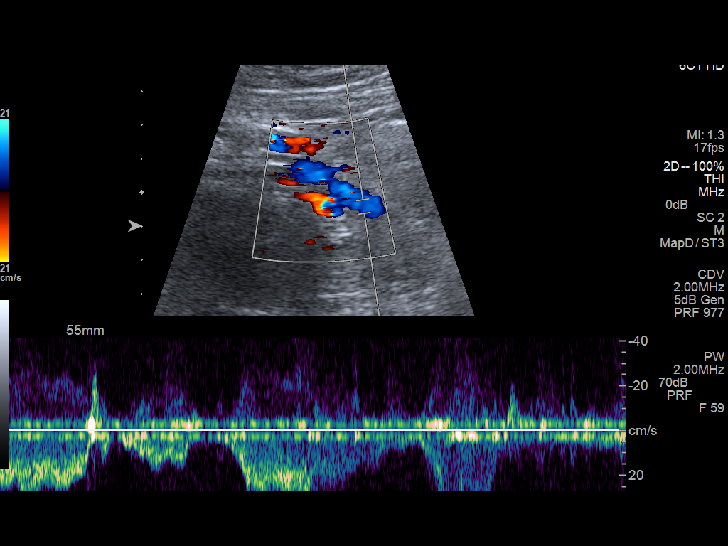
[im 17/33]
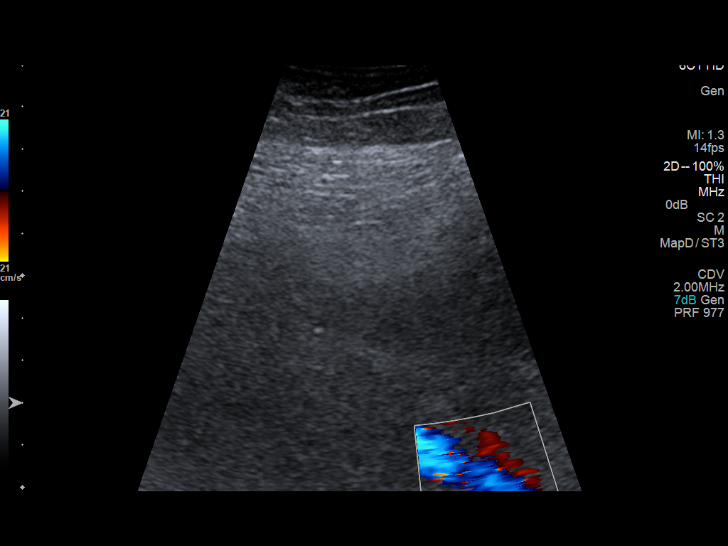
[im 19/33]
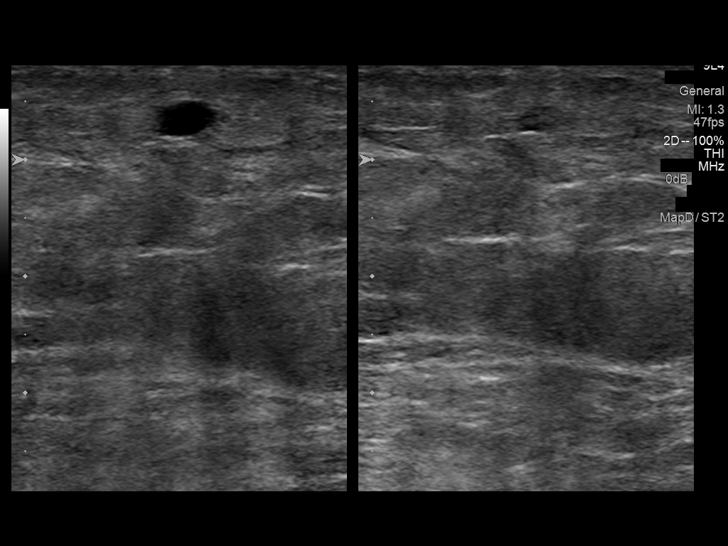
[im 21/33]
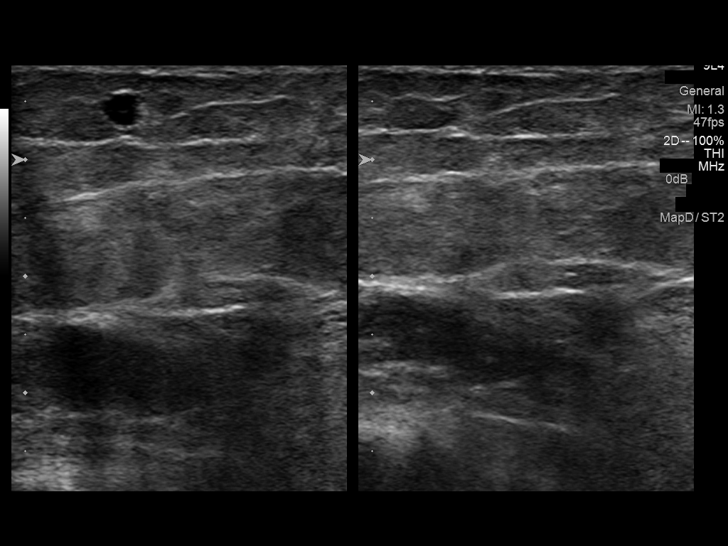
[im 24/33]
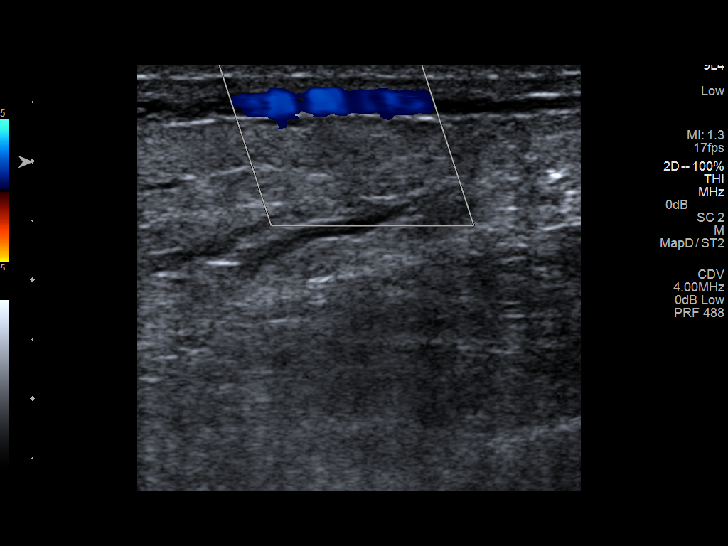
[im 27/33]
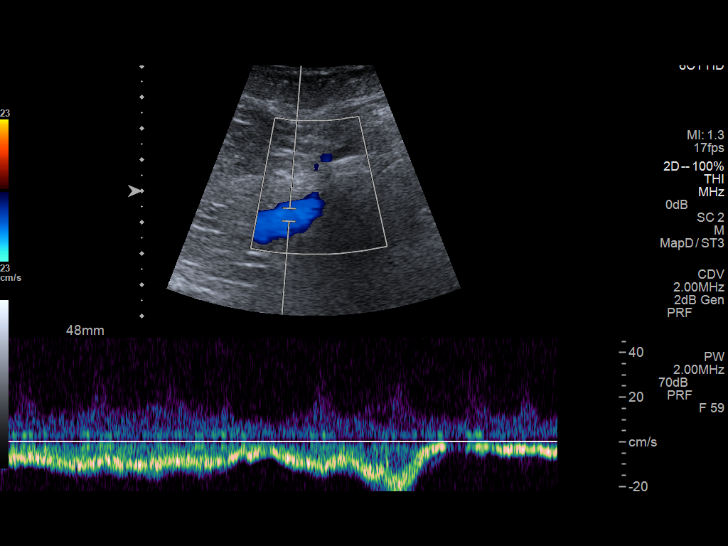
[im 30/33]
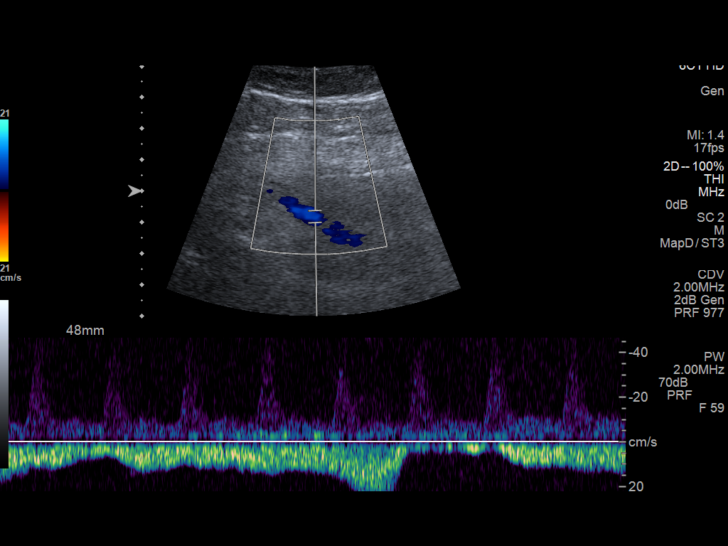
[im 33/33]
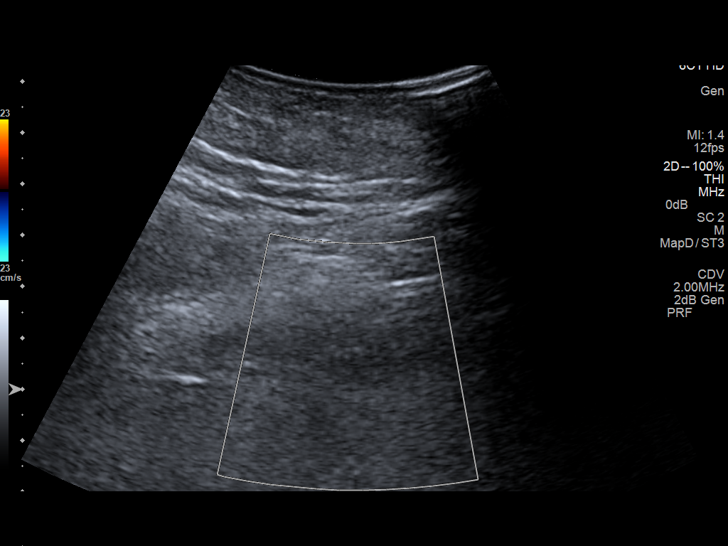

[13 of 24 positions shown; findings below may reference images not displayed]

FINDINGS: Common Femoral Vein: No evidence of thrombus. Normal
compressibility, respiratory phasicity and response to augmentation.

Saphenofemoral Junction: No evidence of thrombus. Normal
compressibility and flow on color Doppler imaging.

Profunda Femoral Vein: No evidence of thrombus. Normal
compressibility and flow on color Doppler imaging.

Femoral Vein: No evidence of thrombus. Normal compressibility,
respiratory phasicity and response to augmentation.

Popliteal Vein: No evidence of thrombus. Normal compressibility,
respiratory phasicity and response to augmentation.

Calf Veins: No evidence of thrombus. Normal compressibility and flow
on color Doppler imaging.

Superficial Great Saphenous Vein: No evidence of thrombus. Normal
compressibility and flow on color Doppler imaging.

Venous Reflux:  None.

Other Findings:  None.
IMPRESSION: No evidence of deep venous thrombosis.

## 2014-03-05 IMAGING — CR DG CHEST 2V
1 series · 3 of 3 positions shown · non-contrast
Comparison: [DATE]

CLINICAL DATA: Shortness of breath and cough for 2 weeks. Dizziness
and weakness.

EXAM:
CHEST  2 VIEW

[Series 7: w chest pa · 0.14mm/px · 3 of 3 slices shown]
[im 1/3]
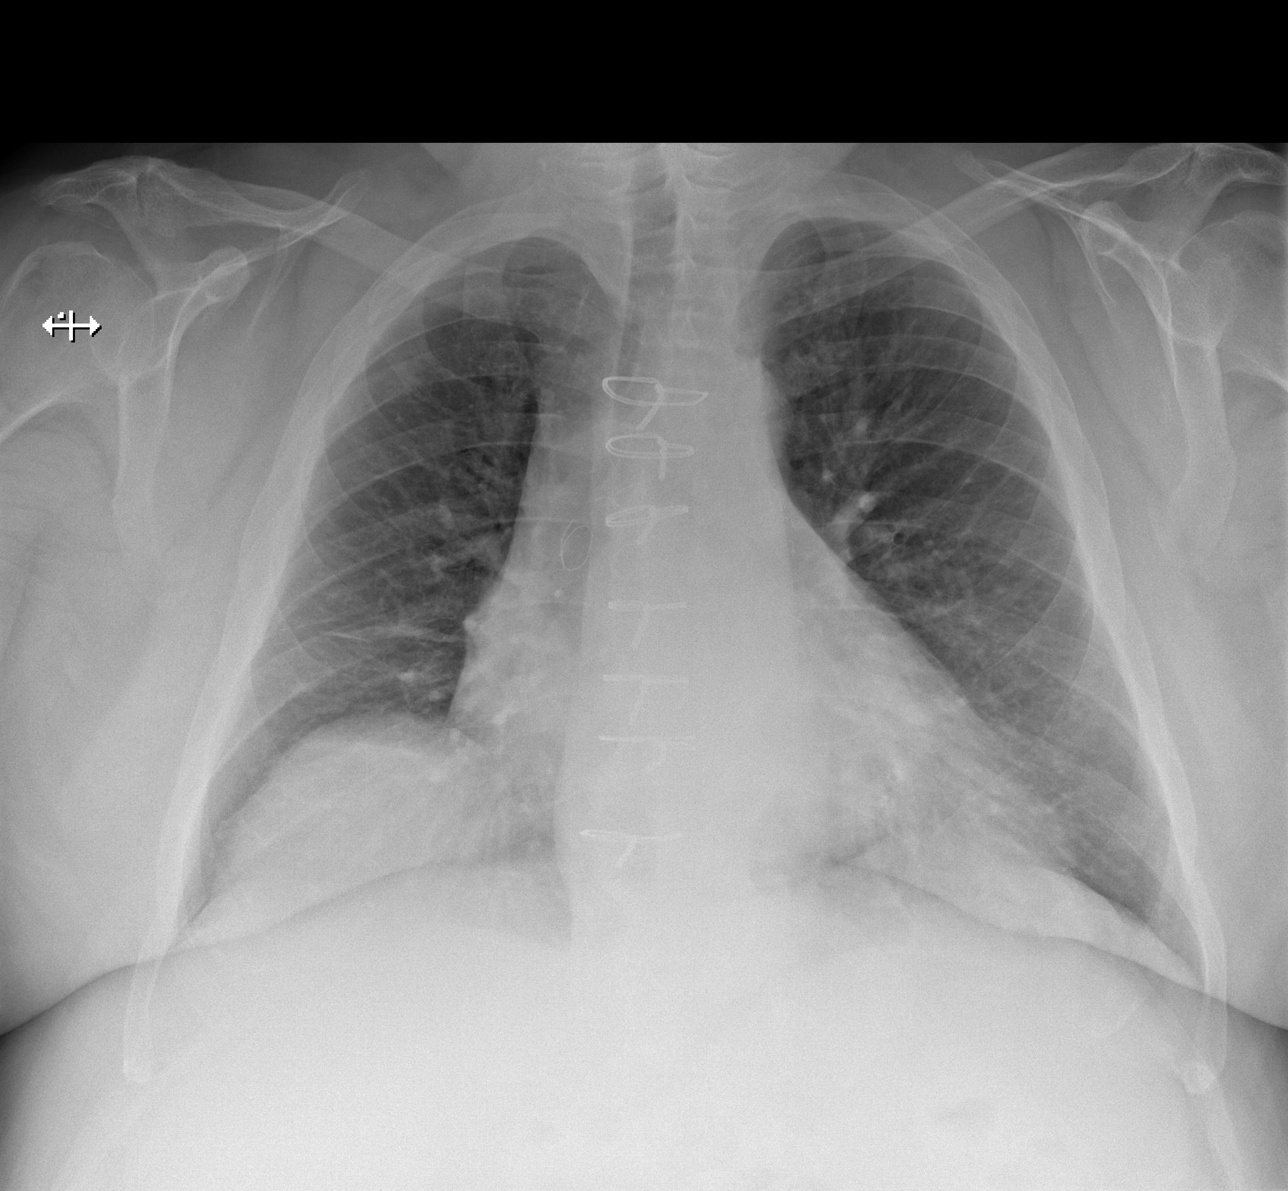
[im 2/3]
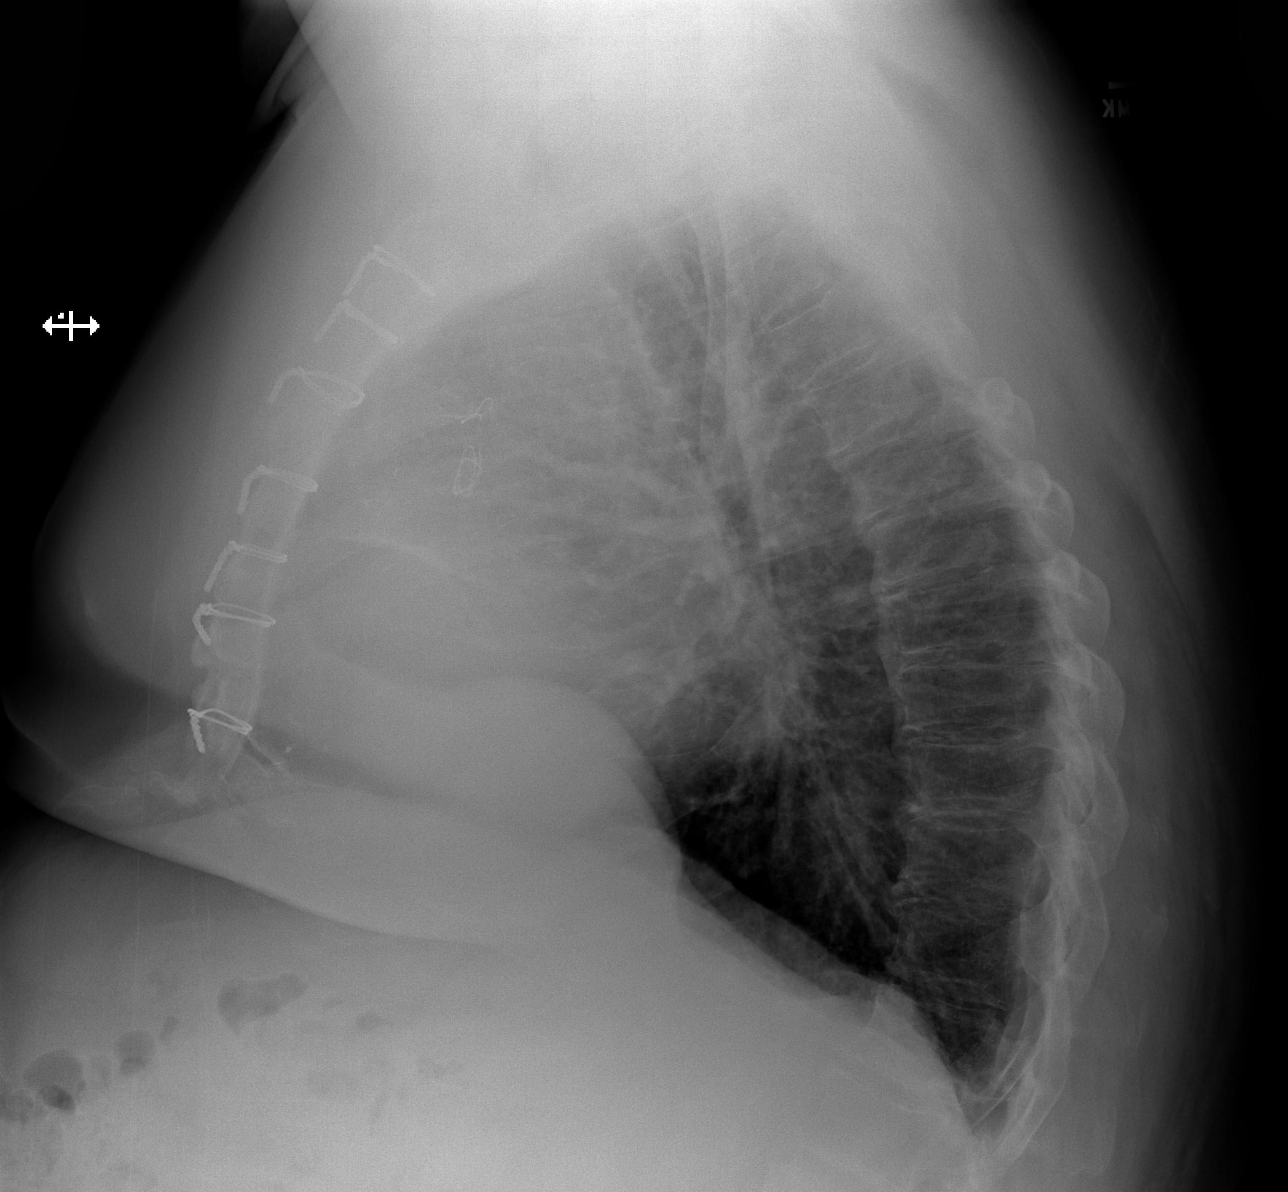
[im 3/3]
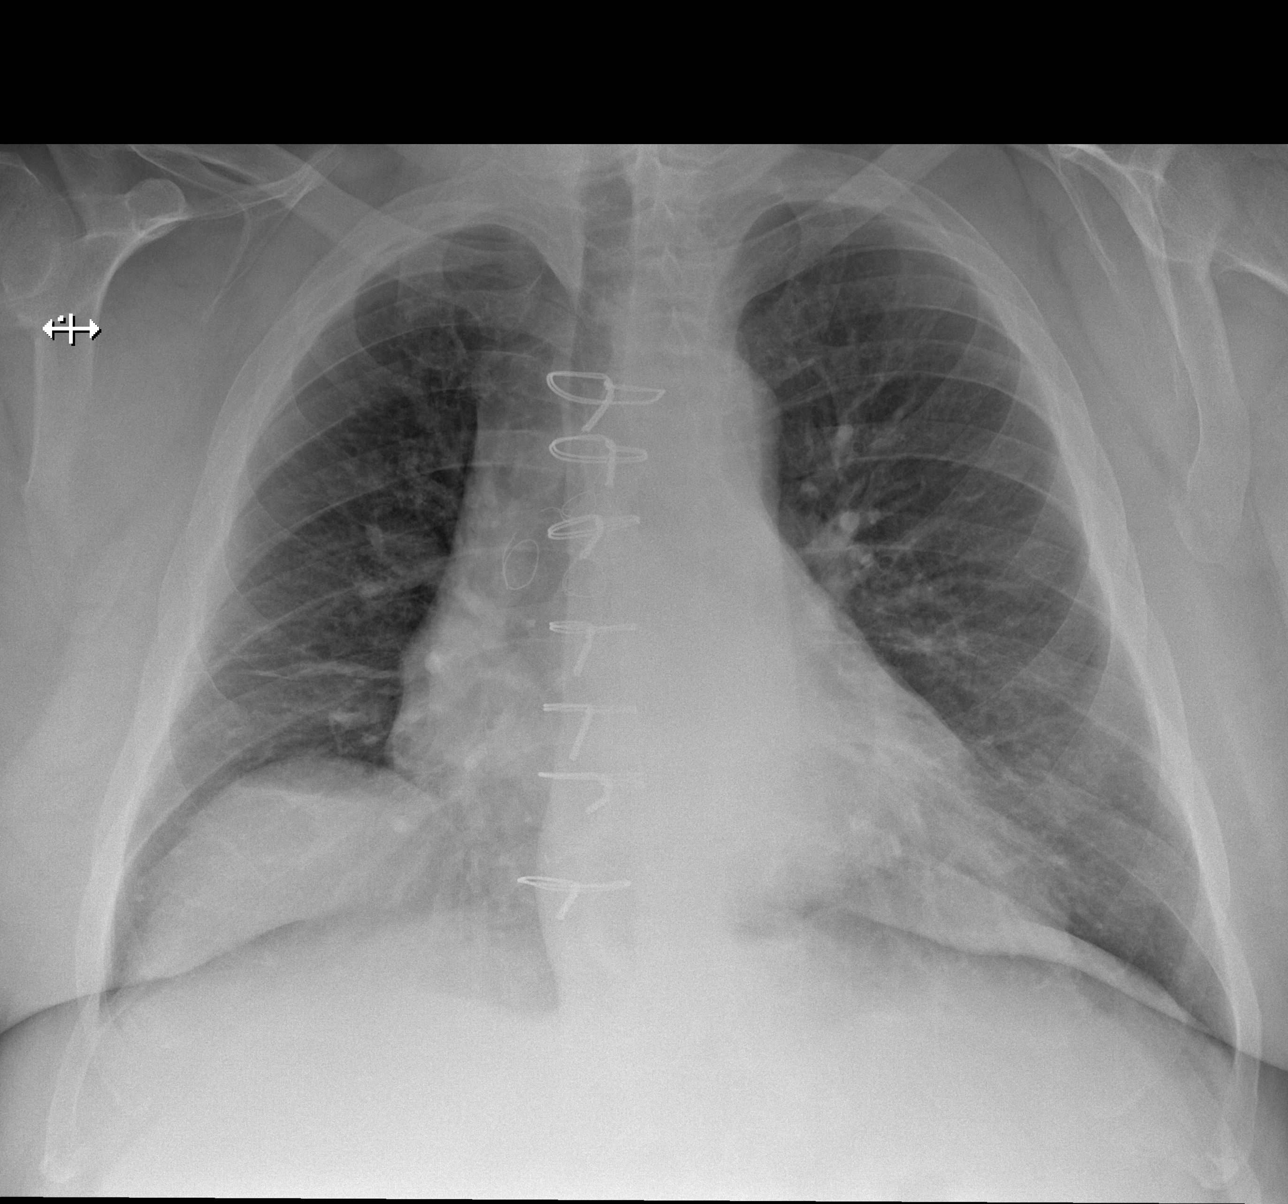

[3 of 3 positions shown; findings below may reference images not displayed]

FINDINGS: Postoperative changes in the mediastinum. The heart size and
mediastinal contours are within normal limits. Both lungs are clear.
The visualized skeletal structures are unremarkable.
IMPRESSION: No active cardiopulmonary disease.

## 2014-03-06 DIAGNOSIS — I2510 Atherosclerotic heart disease of native coronary artery without angina pectoris: Secondary | ICD-10-CM

## 2014-03-06 DIAGNOSIS — I5022 Chronic systolic (congestive) heart failure: Secondary | ICD-10-CM

## 2014-03-06 LAB — CBC WITH DIFFERENTIAL/PLATELET
Basophil #: 0.1 10*3/uL (ref 0.0–0.1)
Basophil %: 0.7 %
Eosinophil #: 0.4 10*3/uL (ref 0.0–0.7)
Eosinophil %: 3.7 %
HCT: 43.8 % (ref 40.0–52.0)
HGB: 14.4 g/dL (ref 13.0–18.0)
Lymphocyte #: 2.8 10*3/uL (ref 1.0–3.6)
Lymphocyte %: 24.1 %
MCH: 29.1 pg (ref 26.0–34.0)
MCHC: 32.9 g/dL (ref 32.0–36.0)
MCV: 88 fL (ref 80–100)
Monocyte #: 0.9 x10 3/mm (ref 0.2–1.0)
Monocyte %: 8.1 %
Neutrophil #: 7.3 10*3/uL — ABNORMAL HIGH (ref 1.4–6.5)
Neutrophil %: 63.4 %
Platelet: 247 10*3/uL (ref 150–440)
RBC: 4.97 10*6/uL (ref 4.40–5.90)
RDW: 15.3 % — ABNORMAL HIGH (ref 11.5–14.5)
WBC: 11.6 10*3/uL — ABNORMAL HIGH (ref 3.8–10.6)

## 2014-03-06 LAB — BASIC METABOLIC PANEL
Anion Gap: 6 — ABNORMAL LOW (ref 7–16)
BUN: 13 mg/dL (ref 7–18)
Calcium, Total: 9 mg/dL (ref 8.5–10.1)
Chloride: 104 mmol/L (ref 98–107)
Co2: 26 mmol/L (ref 21–32)
Creatinine: 1.08 mg/dL (ref 0.60–1.30)
EGFR (African American): >60
EGFR (Non-African Amer.): >60
Glucose: 143 mg/dL — ABNORMAL HIGH (ref 65–99)
Osmolality: 275 (ref 275–301)
Potassium: 4 mmol/L (ref 3.5–5.1)
Sodium: 136 mmol/L (ref 136–145)

## 2014-03-07 LAB — VANCOMYCIN, TROUGH: Vancomycin, Trough: 13 ug/mL (ref 10–20)

## 2014-03-10 ENCOUNTER — Telehealth: Payer: Self-pay | Admitting: *Deleted

## 2014-03-10 LAB — CULTURE, BLOOD (SINGLE)

## 2014-03-10 NOTE — Telephone Encounter (Signed)
Patient called regarding setting up a time for a nuclear stress test???

## 2014-03-10 NOTE — Telephone Encounter (Signed)
Pt's discharge instructions advise him to contact our office for nuclear stress test.  Would you like him to have a lexi or treadmill myoview?

## 2014-03-11 NOTE — Telephone Encounter (Signed)
Spoke w/ pt. He was in [facility] recently for cellulitis of his leg.  Pt reports that Christell Faith, PA had discussed myoview w/ him, but he is concerned about the cost of this. Gave pt number to [facility] and advised him to discuss w/ financial services.  Pt reports that he has no A/C in his car and that it is uncomfortable for him to come out to appts and it will be a financial burden to have this repaired. Pt denies any chest pain, reports his only sx is leg pain.  Pt would like to come in to discuss possible stress test w/ Dr. [name] when the weather is cooler. Pt is sched to see Dr. [name] 04/14/14 but will call if he would like a sooner appt.

## 2014-03-11 NOTE — Telephone Encounter (Signed)
If he was in the hospital recently, we needs the hospital data for our system He was offered a stress test earlier in the year and did not want it for financial reasons

## 2014-03-29 ENCOUNTER — Emergency Department: Payer: Self-pay | Admitting: Emergency Medicine

## 2014-03-30 LAB — CBC
HCT: 42.1 % (ref 40.0–52.0)
HGB: 13.4 g/dL (ref 13.0–18.0)
MCH: 28.7 pg (ref 26.0–34.0)
MCHC: 31.9 g/dL — ABNORMAL LOW (ref 32.0–36.0)
MCV: 90 fL (ref 80–100)
Platelet: 238 10*3/uL (ref 150–440)
RBC: 4.67 10*6/uL (ref 4.40–5.90)
RDW: 15.7 % — ABNORMAL HIGH (ref 11.5–14.5)
WBC: 10.2 10*3/uL (ref 3.8–10.6)

## 2014-03-30 LAB — BASIC METABOLIC PANEL
Anion Gap: 10 (ref 7–16)
BUN: 14 mg/dL (ref 7–18)
Calcium, Total: 9.1 mg/dL (ref 8.5–10.1)
Chloride: 107 mmol/L (ref 98–107)
Co2: 25 mmol/L (ref 21–32)
Creatinine: 1.07 mg/dL (ref 0.60–1.30)
EGFR (African American): >60
EGFR (Non-African Amer.): >60
Glucose: 152 mg/dL — ABNORMAL HIGH (ref 65–99)
Osmolality: 287 (ref 275–301)
Potassium: 4 mmol/L (ref 3.5–5.1)
Sodium: 142 mmol/L (ref 136–145)

## 2014-03-30 LAB — TROPONIN I: Troponin-I: <0.02 ng/mL

## 2014-03-30 IMAGING — DX DG CHEST 2V
2 series · 2 of 2 positions shown · non-contrast
Comparison: [DATE].

CLINICAL DATA: Productive cough.

EXAM:
CHEST  2 VIEW

[chest pa]
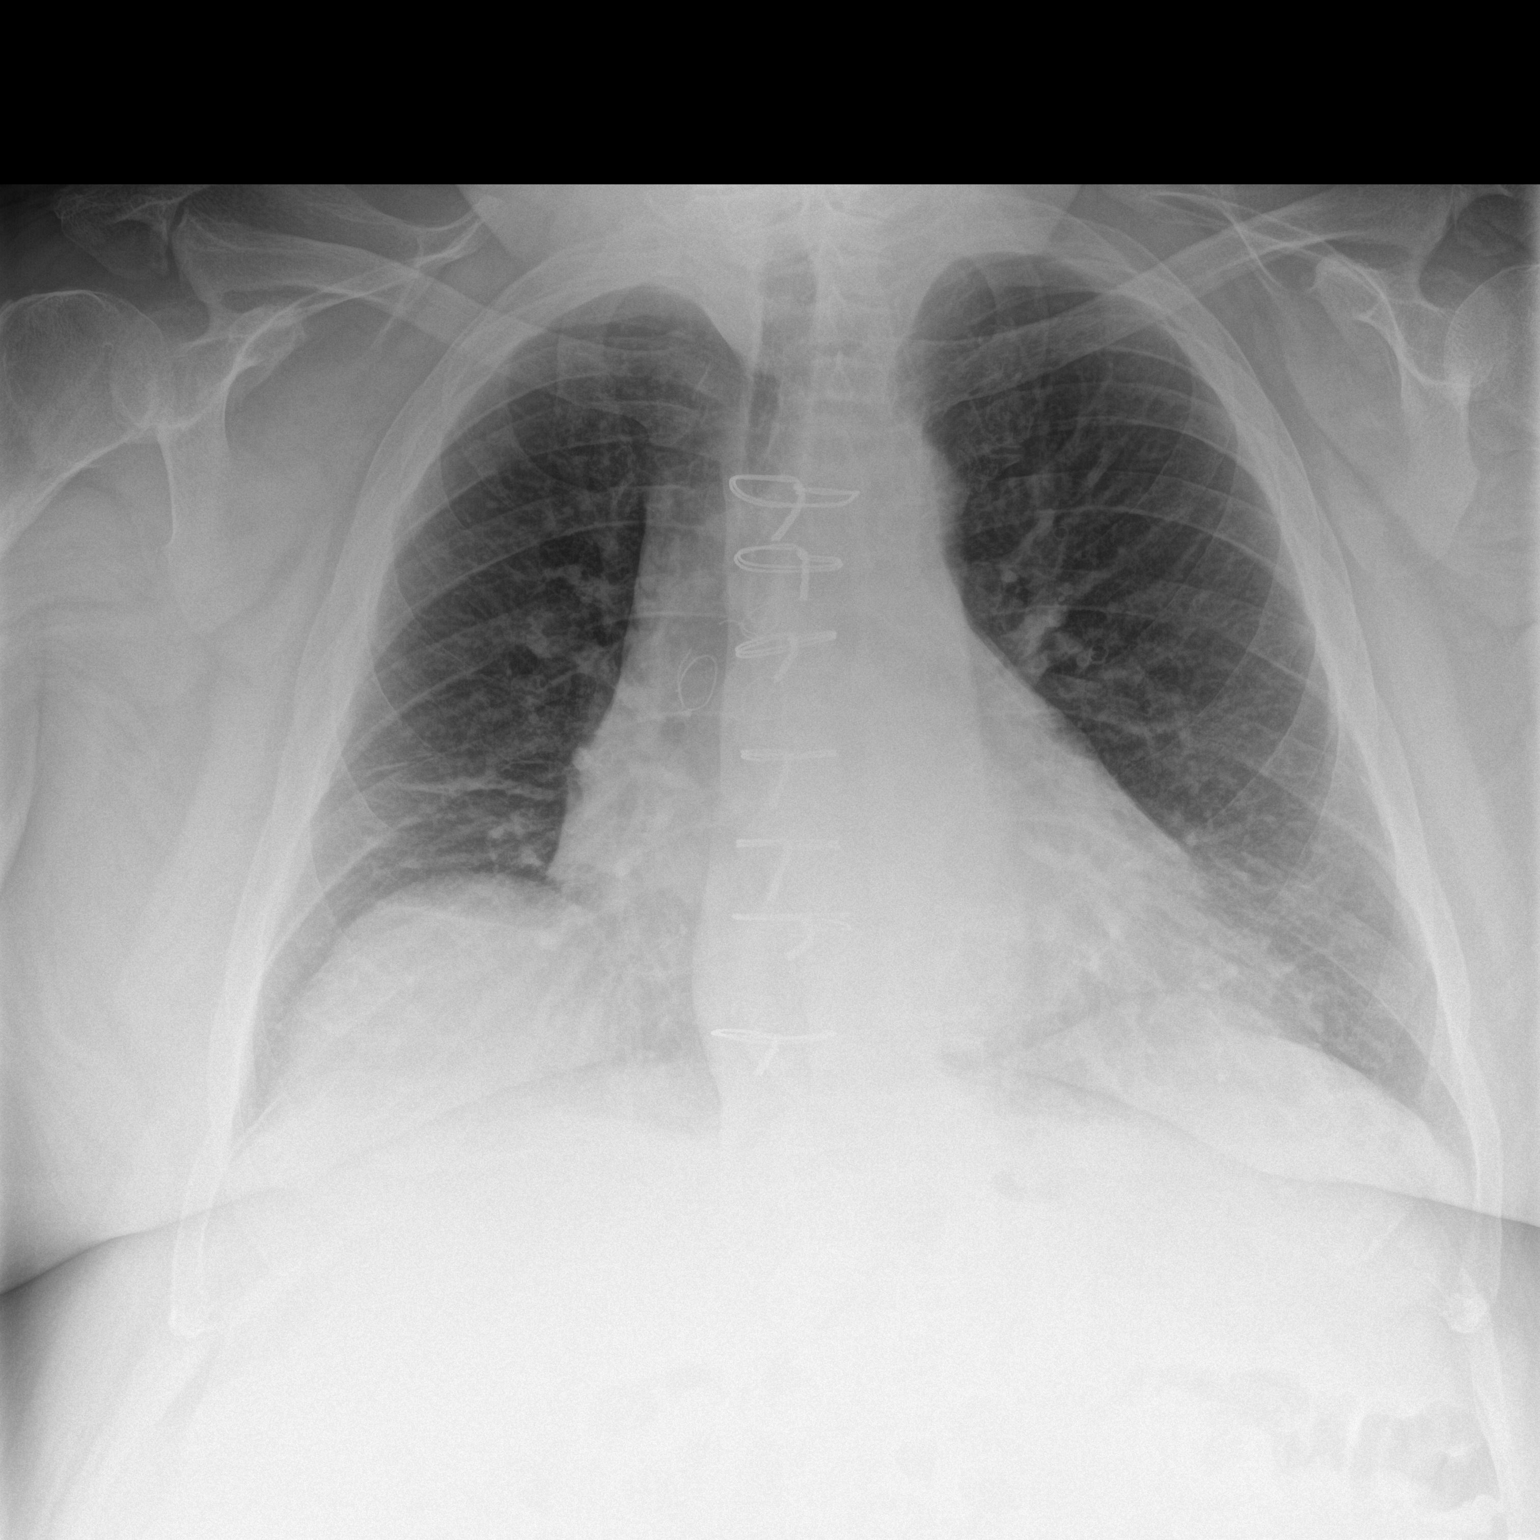

[chest lat]
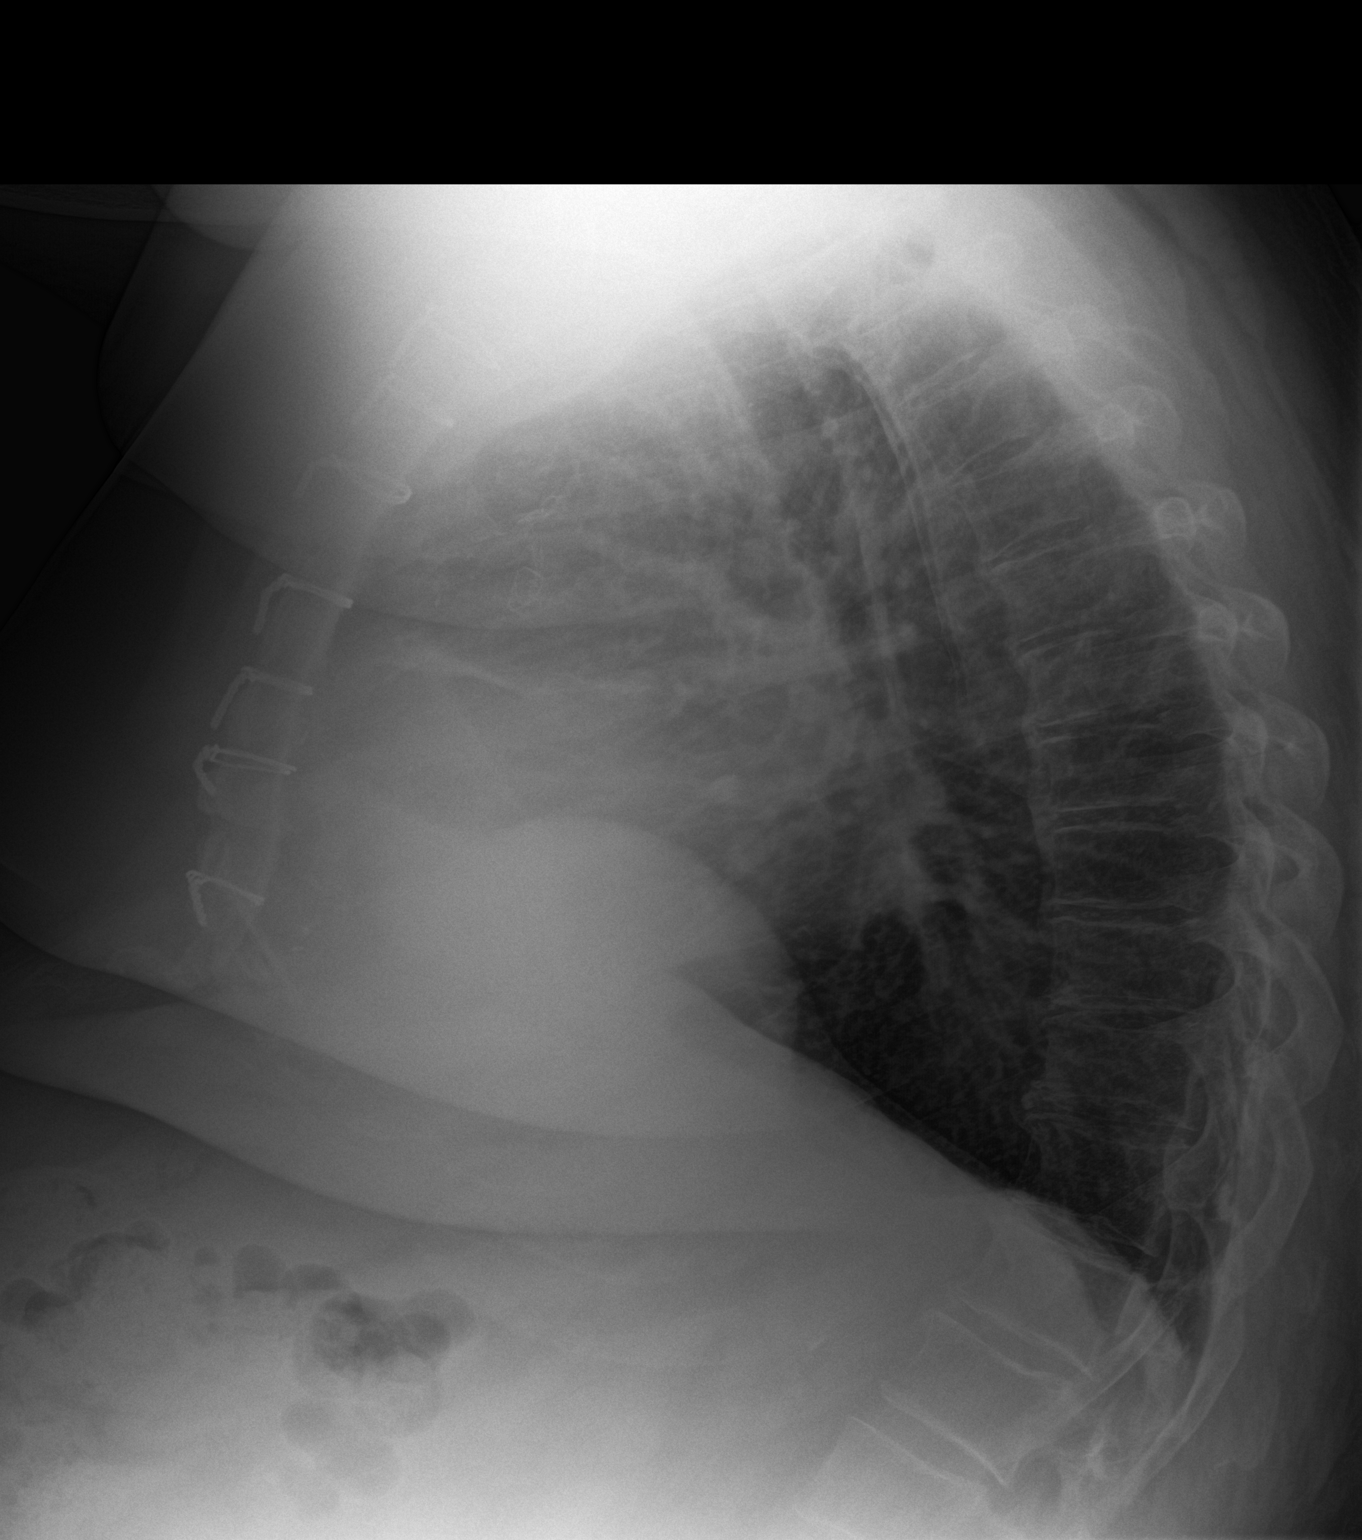

[2 of 2 positions shown; findings below may reference images not displayed]

FINDINGS: Stable enlarged cardiac silhouette and post CABG changes. Stable
linear scar in the right lower lung zone. Stable mildly prominent
pulmonary vasculature and interstitial markings. Diffuse osteopenia.
Thoracic spine degenerative changes, including changes of DISH. The
hemidiaphragms are flattened on the lateral view.
IMPRESSION: 1. No acute abnormality.
2. Stable cardiomegaly, mild pulmonary vascular congestion and
changes of COPD.

## 2014-04-01 ENCOUNTER — Emergency Department: Payer: Self-pay | Admitting: Emergency Medicine

## 2014-04-01 LAB — CBC
HCT: 41.6 % (ref 40.0–52.0)
HGB: 13 g/dL (ref 13.0–18.0)
MCH: 28.2 pg (ref 26.0–34.0)
MCHC: 31.1 g/dL — ABNORMAL LOW (ref 32.0–36.0)
MCV: 91 fL (ref 80–100)
Platelet: 255 10*3/uL (ref 150–440)
RBC: 4.6 10*6/uL (ref 4.40–5.90)
RDW: 15.5 % — ABNORMAL HIGH (ref 11.5–14.5)
WBC: 13.3 10*3/uL — ABNORMAL HIGH (ref 3.8–10.6)

## 2014-04-01 IMAGING — CR DG CHEST 1V PORT
1 series · 1 of 1 positions shown · non-contrast
Comparison: [DATE]

CLINICAL DATA: Chest pain and body aches.

EXAM:
PORTABLE CHEST - 1 VIEW

[ap]
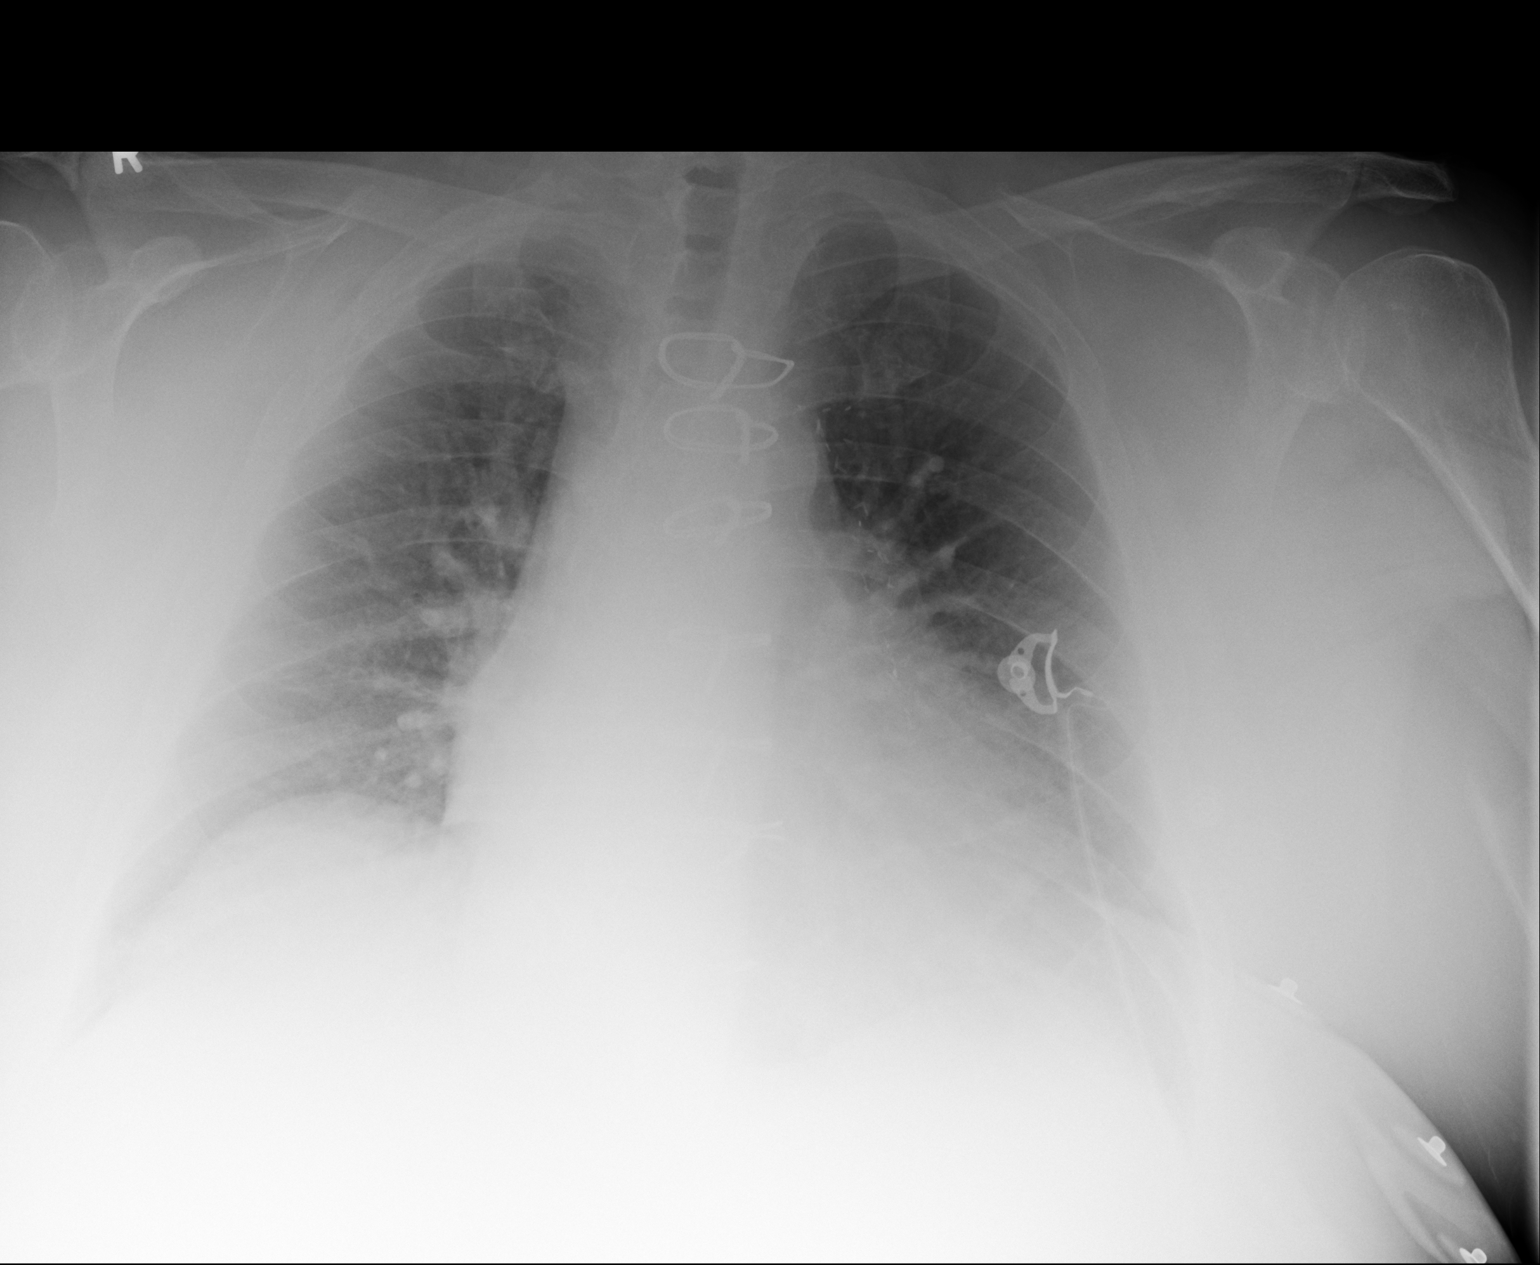

[1 of 1 positions shown; findings below may reference images not displayed]

FINDINGS: Postoperative changes in the mediastinum. Shallow inspiration.
Cardiac enlargement with mild central vascular congestion. No edema
or consolidation. No blunting of costophrenic angles.
IMPRESSION: Cardiac enlargement with mild pulmonary vascular congestion.

## 2014-04-02 LAB — URINALYSIS, COMPLETE
Bacteria: NONE SEEN
Bilirubin,UR: NEGATIVE
Blood: NEGATIVE
Glucose,UR: >=500 mg/dL (ref 0–75)
Ketone: NEGATIVE
Leukocyte Esterase: NEGATIVE
Nitrite: NEGATIVE
Ph: 6 (ref 4.5–8.0)
Protein: NEGATIVE
RBC,UR: 1 /HPF (ref 0–5)
Specific Gravity: 1.009 (ref 1.003–1.030)
Squamous Epithelial: NONE SEEN
WBC UR: <1 /HPF (ref 0–5)

## 2014-04-02 LAB — COMPREHENSIVE METABOLIC PANEL
Albumin: 3.2 g/dL — ABNORMAL LOW (ref 3.4–5.0)
Alkaline Phosphatase: 90 U/L
Anion Gap: 8 (ref 7–16)
BUN: 14 mg/dL (ref 7–18)
Bilirubin,Total: 0.4 mg/dL (ref 0.2–1.0)
Calcium, Total: 8.1 mg/dL — ABNORMAL LOW (ref 8.5–10.1)
Chloride: 107 mmol/L (ref 98–107)
Co2: 24 mmol/L (ref 21–32)
Creatinine: 1.1 mg/dL (ref 0.60–1.30)
EGFR (African American): >60
EGFR (Non-African Amer.): >60
Glucose: 218 mg/dL — ABNORMAL HIGH (ref 65–99)
Osmolality: 285 (ref 275–301)
Potassium: 4.4 mmol/L (ref 3.5–5.1)
SGOT(AST): 35 U/L (ref 15–37)
SGPT (ALT): 41 U/L
Sodium: 139 mmol/L (ref 136–145)
Total Protein: 7 g/dL (ref 6.4–8.2)

## 2014-04-02 LAB — TROPONIN I
Troponin-I: <0.02 ng/mL
Troponin-I: <0.02 ng/mL

## 2014-04-02 LAB — CK TOTAL AND CKMB (NOT AT ARMC)
CK, Total: 349 U/L — ABNORMAL HIGH
CK-MB: 2.2 ng/mL (ref 0.5–3.6)

## 2014-04-14 ENCOUNTER — Encounter: Payer: Self-pay | Admitting: Cardiovascular Disease

## 2014-04-14 ENCOUNTER — Ambulatory Visit (INDEPENDENT_AMBULATORY_CARE_PROVIDER_SITE_OTHER): Payer: Medicare Other | Admitting: Cardiovascular Disease

## 2014-04-14 VITALS — BP 150/88 | HR 74 | Ht 65.0 in | Wt 296.8 lb

## 2014-04-14 DIAGNOSIS — I509 Heart failure, unspecified: Secondary | ICD-10-CM

## 2014-04-14 DIAGNOSIS — R0609 Other forms of dyspnea: Secondary | ICD-10-CM

## 2014-04-14 DIAGNOSIS — I5032 Chronic diastolic (congestive) heart failure: Secondary | ICD-10-CM

## 2014-04-14 DIAGNOSIS — I25810 Atherosclerosis of coronary artery bypass graft(s) without angina pectoris: Secondary | ICD-10-CM

## 2014-04-14 DIAGNOSIS — I10 Essential (primary) hypertension: Secondary | ICD-10-CM

## 2014-04-14 DIAGNOSIS — E782 Mixed hyperlipidemia: Secondary | ICD-10-CM

## 2014-04-14 DIAGNOSIS — R0989 Other specified symptoms and signs involving the circulatory and respiratory systems: Secondary | ICD-10-CM

## 2014-04-14 DIAGNOSIS — I252 Old myocardial infarction: Secondary | ICD-10-CM

## 2014-04-14 MED ORDER — FUROSEMIDE 40 MG PO TABS: 40.0000 mg | ORAL_TABLET | Freq: Two times a day (BID) | ORAL | Status: DC | PRN

## 2014-04-14 MED ORDER — POTASSIUM CHLORIDE ER 10 MEQ PO TBCR: 10.0000 meq | EXTENDED_RELEASE_TABLET | Freq: Every day | ORAL | Status: DC

## 2014-04-14 NOTE — Assessment & Plan Note (Signed)
Diastolic CHF exacerbation one month ago, exacerbated by drinking significant fluids, overall body habitus. Recommended he take extra Lasix after lunch for worsening edema of the right lower extremity

## 2014-04-14 NOTE — Assessment & Plan Note (Signed)
Currently with no symptoms of angina. No further workup at this time. Continue current medication regimen. 

## 2014-04-14 NOTE — Progress Notes (Signed)
Patient ID: Evan [name], male    DOB: [birth year], 58 y.o.   MRN: [id number]  HPI Comments: Evan [name] is a 58 year old gentleman with morbid obesity, chronic diastolic CHF,  coronary artery disease with bypass surgery in 2009 with 6 vessel bypass, numerous stenting dating back to the early 2000 , on disability . He has chronic right lower extremity swelling. History of obstructive sleep apnea, uses CPAP nightly History of anxiety and obsessive-compulsive disorder  History of medication noncompliance secondary to financial issues  In followup today, he reports that he was recently at [facility] at the end of August 2015 with symptoms of shortness of breath.  Triage note reported he was coughing up phlegm, burning throat, "chest was filling up with fluid " EKG 04/01/2014 showed normal sinus rhythm with PVCs Chest x-ray showing edema White count of 13, hematocrit 41 Creatinine 1.1, negative cardiac enzymes He was treated with diuretics with improvement of his symptoms  In followup today, his weight is up 13 pounds from her prior clinic visit. He does not have money to pay for his medications. His car needs work. He does take significant fluids in the daytime as his mouth is always dry Continues to have leg edema on the right, no significant edema on the left   Previously advised to have a stress test February 2015 but did not followup secondary to financial issues He presented to the hospital Dec 30 2013 with cough,  lower extremity edema. He was treated with gentle diuresis and discharged on Lasix 40 mg daily. Unable to exclude viral URI as he had some wheezing and was started on inhalers by internal medicine. Zoloft dosing was increased in the hospital as it was felt he was having some anxiety attacks  Echocardiogram may 25th 2015 showing ejection fraction 50-55%, unable to estimate right ventricular systolic pressure No evidence of DVT on the right  Surgical details he has LIMA to the LAD,  vein graft to the diagonal, vein graft to the OM, vein graft to the ramus, vein graft to OM 2 and vein graft to the distal RCA  EKG today shows normal sinus rhythm with rate 74 beats per minute with possible old inferior MI    Outpatient Encounter Prescriptions as of 04/14/2014  Medication Sig  . albuterol (PROVENTIL) (2.5 MG/3ML) 0.083% nebulizer solution Take 2.5 mg by nebulization every 6 (six) hours as needed for wheezing or shortness of breath.  Marland Kitchen aspirin 81 MG EC tablet Take 81 mg by mouth 2 (two) times daily.   Marland Kitchen esomeprazole (NEXIUM) 40 MG capsule Take 40 mg by mouth daily at 12 noon.  . fluticasone (FLONASE) 50 MCG/ACT nasal spray Place 1 spray into both nostrils daily.   . hydrOXYzine (ATARAX/VISTARIL) 25 MG tablet Take 25 mg by mouth at bedtime.  . metoprolol tartrate (LOPRESSOR) 25 MG tablet Take 25 mg by mouth 2 (two) times daily.    . polyethylene glycol powder (GLYCOLAX/MIRALAX) powder Take 1 Container by mouth daily.   Marland Kitchen PROAIR HFA 108 (90 BASE) MCG/ACT inhaler Inhale 1 puff into the lungs every 4 (four) hours as needed.   . ranitidine (ZANTAC) 150 MG capsule Take 1 capsule (150 mg total) by mouth 2 (two) times daily.  . simvastatin (ZOCOR) 40 MG tablet Take 1 tablet (40 mg total) by mouth daily.  . furosemide (LASIX) 40 MG tablet Take 40 mg by mouth daily.   . potassium chloride (K-DUR) 10 MEQ tablet Take 1 tablet (10 mEq total) by mouth  daily.     Review of Systems  Constitutional: Negative.   HENT: Negative.   Eyes: Negative.   Respiratory: Positive for shortness of breath.   Cardiovascular: Positive for leg swelling.  Gastrointestinal: Negative.   Endocrine: Negative.   Musculoskeletal: Negative.   Skin: Negative.   Allergic/Immunologic: Negative.   Neurological: Negative.   Hematological: Negative.   Psychiatric/Behavioral: Negative.   All other systems reviewed and are negative.   BP 150/88  Pulse 74  Ht 5\' 5"  (1.651 m)  Wt 296 lb 12 oz (134.605 kg)   BMI 49.38 kg/m2  Physical Exam  Nursing note and vitals reviewed. Constitutional: He is oriented to person, place, and time. He appears well-developed and well-nourished.  Obese  HENT:  Head: Normocephalic.  Nose: Nose normal.  Mouth/Throat: Oropharynx is clear and moist.  Eyes: Conjunctivae are normal. Pupils are equal, round, and reactive to light.  Neck: Normal range of motion. Neck supple. No JVD present.  Cardiovascular: Normal rate, regular rhythm, S1 normal, S2 normal, normal heart sounds and intact distal pulses.  Exam reveals no gallop and no friction rub.   No murmur heard. Right lower extremity edema with  1+ pitting edema to below the knee  Pulmonary/Chest: Effort normal and breath sounds normal. No respiratory distress. He has no wheezes. He has no rales. He exhibits no tenderness.  Abdominal: Soft. Bowel sounds are normal. He exhibits no distension. There is no tenderness.  Musculoskeletal: Normal range of motion. He exhibits no edema and no tenderness.  Lymphadenopathy:    He has no cervical adenopathy.  Neurological: He is alert and oriented to person, place, and time. Coordination normal.  Skin: Skin is warm and dry. No rash noted. No erythema.  Psychiatric: He has a normal mood and affect. His behavior is normal. Judgment and thought content normal.      Assessment and Plan

## 2014-04-14 NOTE — Assessment & Plan Note (Signed)
We have encouraged continued exercise, careful diet management in an effort to lose weight. 

## 2014-04-14 NOTE — Patient Instructions (Addendum)
You are doing well.  Take extra lasix after lunch for worsening leg swelling or shortness of breath  Decrease your fluid intake, Monitor your weight , keep the weight down  We will set you up for the CHF clinic: Monday, 9/21 @ 10:30 [facility] @ [place], [address] [phone]  Please call us if you have new issues that need to be addressed before your next appt.  Your physician wants you to follow-up in: 3 months.  You will receive a reminder letter in the mail two months in advance. If you don't receive a letter, please call our office to schedule the follow-up appointment.

## 2014-04-14 NOTE — Assessment & Plan Note (Signed)
Blood pressure is well controlled on today's visit. No changes made to the medications. 

## 2014-04-14 NOTE — Assessment & Plan Note (Addendum)
Currently taking simvastatin. Goal LDL less than 70. He has followup with primary care for lab work

## 2014-04-14 NOTE — Assessment & Plan Note (Signed)
Shortness of breath improved from one month ago. Weight is up 13 pounds. Encouraged him to follow a low salt, low fluid diet, extra Lasix after lunch for weight gain or worsening right lower extremity swelling

## 2014-04-28 ENCOUNTER — Inpatient Hospital Stay: Payer: Self-pay | Admitting: Specialist

## 2014-04-28 LAB — BASIC METABOLIC PANEL
Anion Gap: 6 — ABNORMAL LOW (ref 7–16)
BUN: 15 mg/dL (ref 7–18)
Calcium, Total: 8.4 mg/dL — ABNORMAL LOW (ref 8.5–10.1)
Chloride: 108 mmol/L — ABNORMAL HIGH (ref 98–107)
Co2: 26 mmol/L (ref 21–32)
Creatinine: 1.03 mg/dL (ref 0.60–1.30)
EGFR (African American): >60
EGFR (Non-African Amer.): >60
Glucose: 178 mg/dL — ABNORMAL HIGH (ref 65–99)
Osmolality: 285 (ref 275–301)
Potassium: 3.9 mmol/L (ref 3.5–5.1)
Sodium: 140 mmol/L (ref 136–145)

## 2014-04-28 LAB — CBC
HCT: 44 % (ref 40.0–52.0)
HGB: 13.8 g/dL (ref 13.0–18.0)
MCH: 28.1 pg (ref 26.0–34.0)
MCHC: 31.5 g/dL — ABNORMAL LOW (ref 32.0–36.0)
MCV: 89 fL (ref 80–100)
Platelet: 276 10*3/uL (ref 150–440)
RBC: 4.93 10*6/uL (ref 4.40–5.90)
RDW: 15.5 % — ABNORMAL HIGH (ref 11.5–14.5)
WBC: 10.8 10*3/uL — ABNORMAL HIGH (ref 3.8–10.6)

## 2014-04-28 IMAGING — US US EXTREM LOW VENOUS*R*
1 series · 13 of 24 positions shown · non-contrast
Comparison: None.

CLINICAL DATA: Right leg pain and edema



[Series 1: us extrem low venous*right* · 0.10mm/px · 13 of 34 slices shown]
[im 1/34]
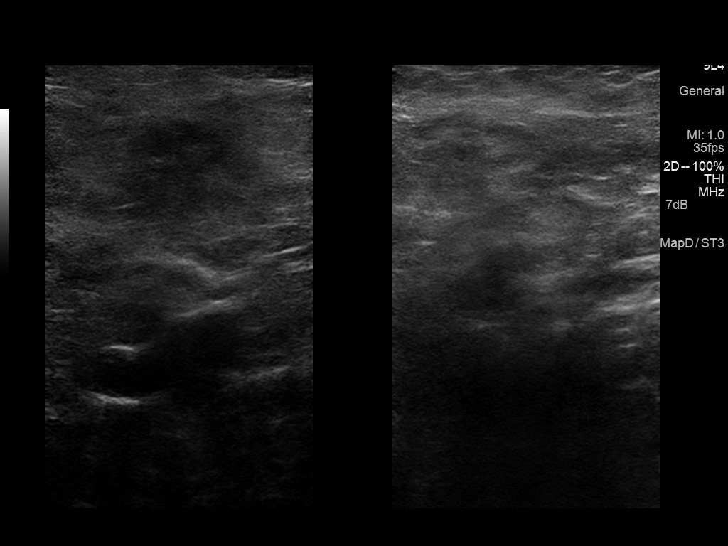
[im 3/34]
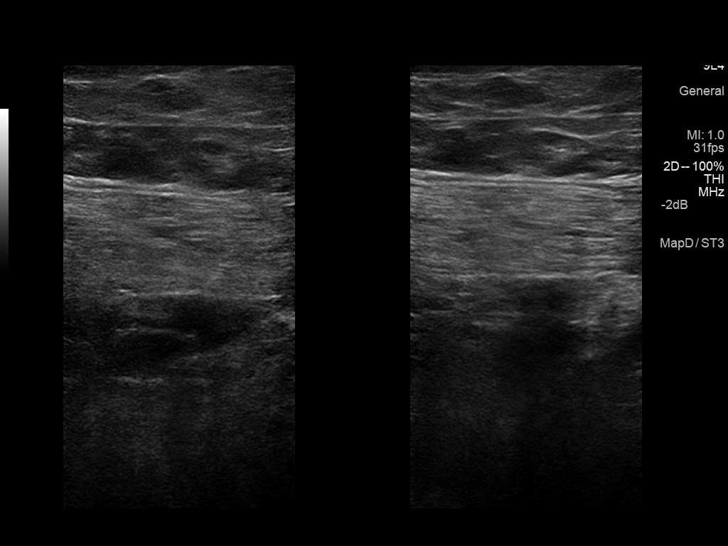
[im 6/34]
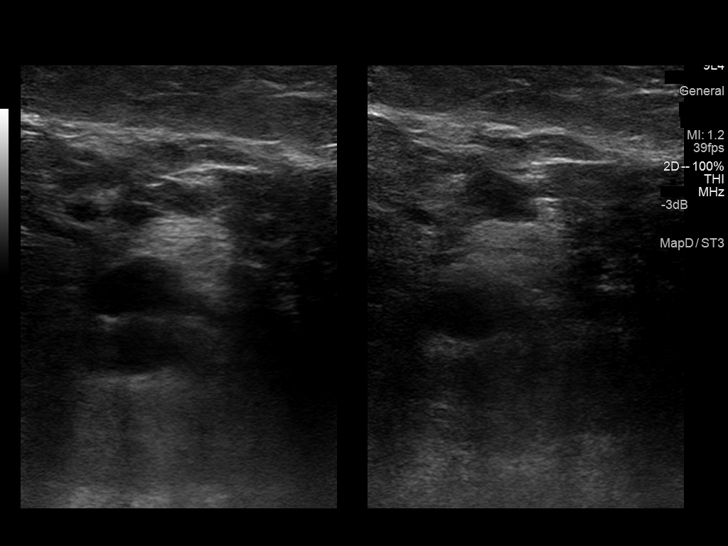
[im 9/34]
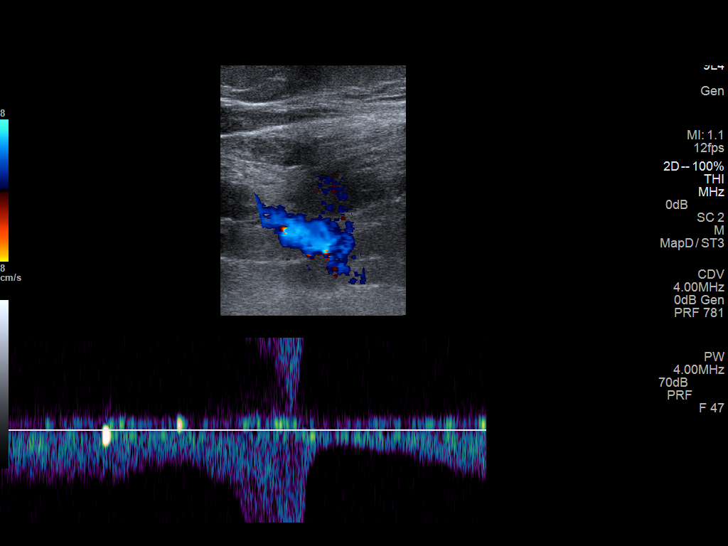
[im 12/34]
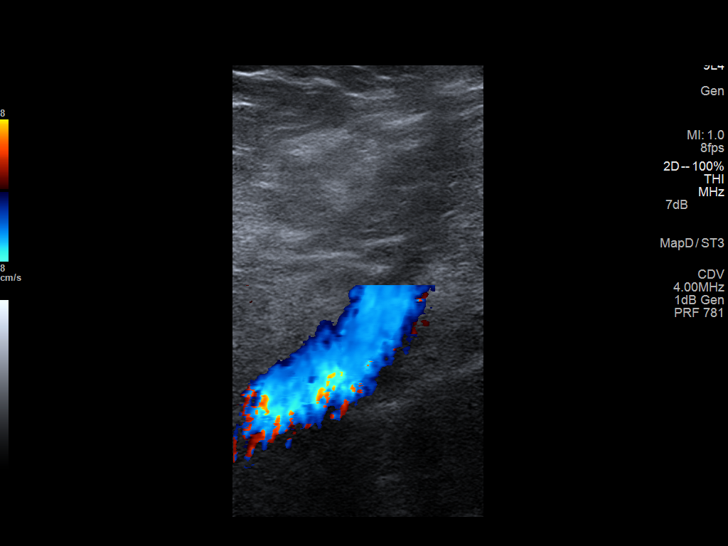
[im 15/34]
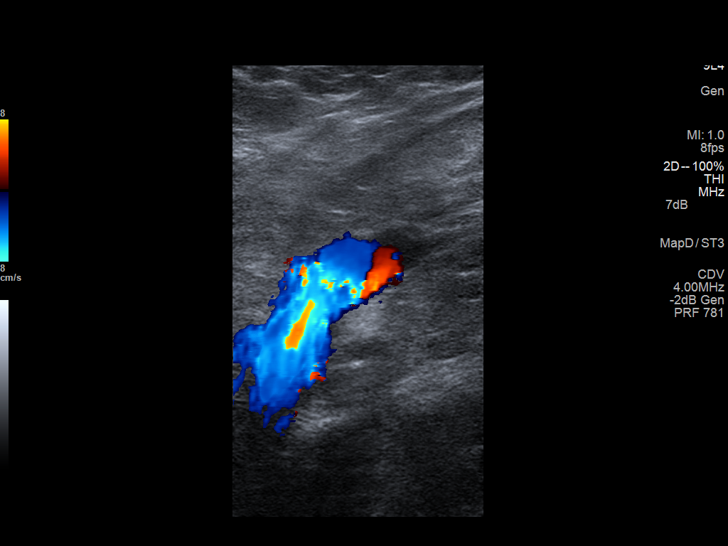
[im 18/34]
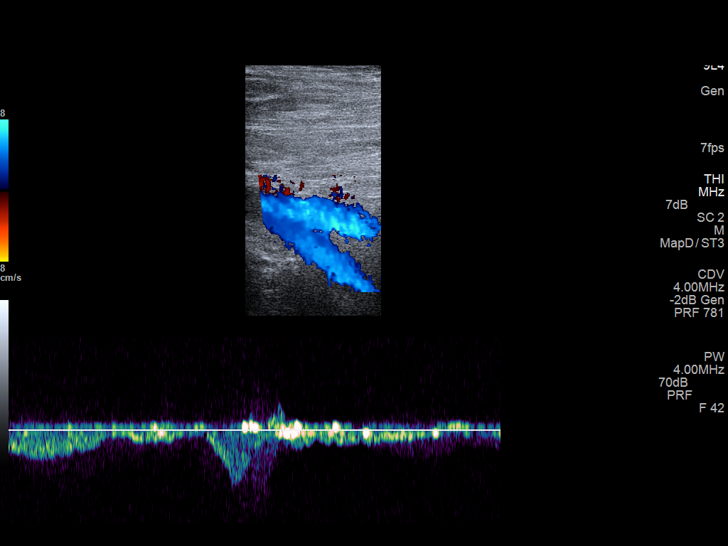
[im 19/34]
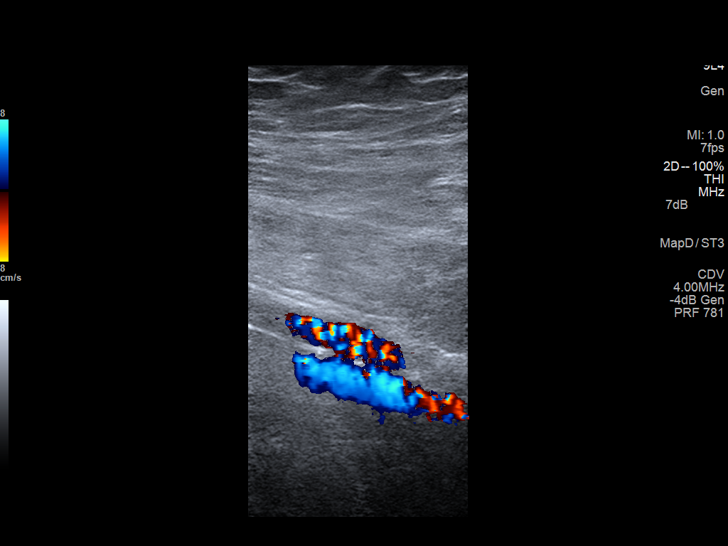
[im 22/34]
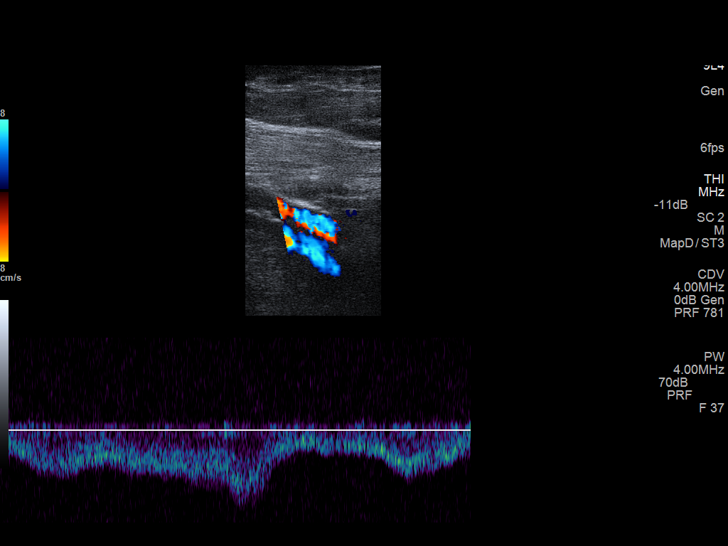
[im 25/34]
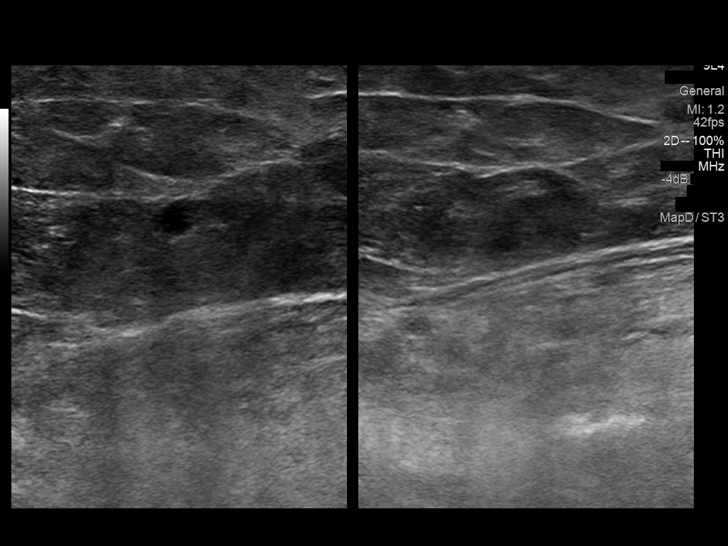
[im 28/34]
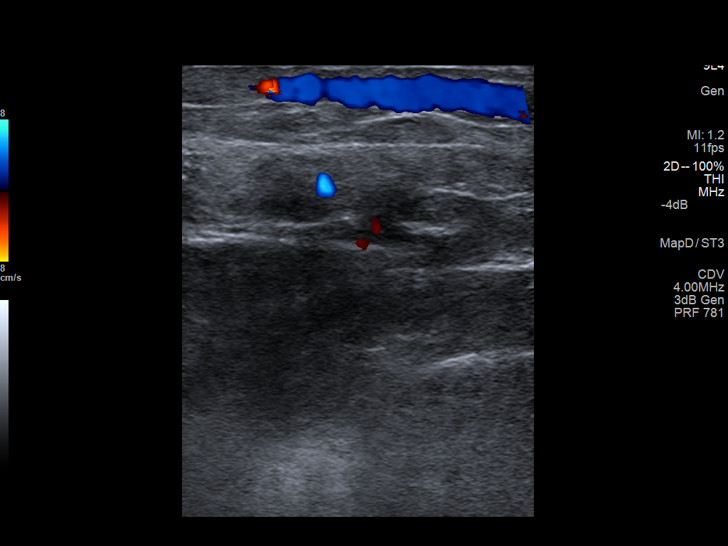
[im 31/34]
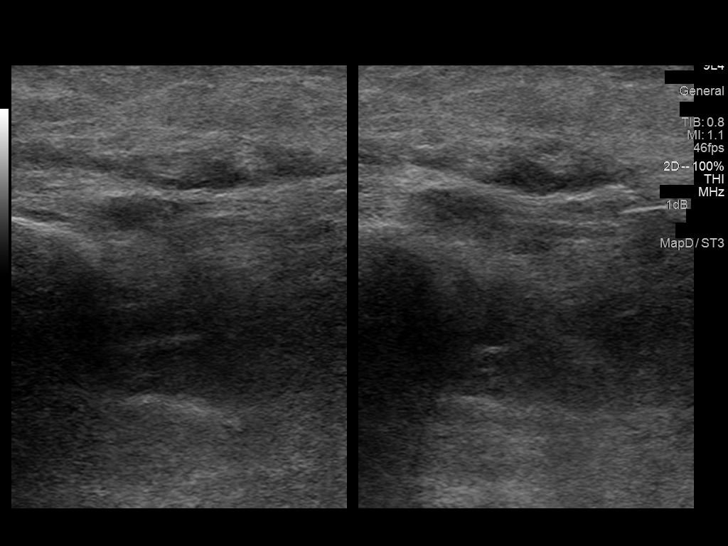
[im 34/34]
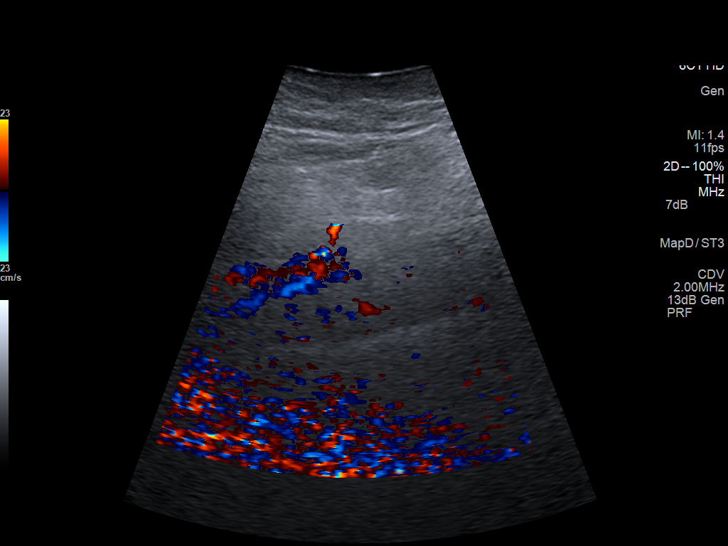

[13 of 24 positions shown; findings below may reference images not displayed]

FINDINGS: Common Femoral Vein: No evidence of thrombus. Normal
compressibility, respiratory phasicity and response to augmentation.

Saphenofemoral Junction: No evidence of thrombus. Normal
compressibility and flow on color Doppler imaging.

Profunda Femoral Vein: No evidence of thrombus. Normal
compressibility and flow on color Doppler imaging.

Femoral Vein: No evidence of thrombus. Normal compressibility,
respiratory phasicity and response to augmentation.

Popliteal Vein: No evidence of thrombus. Normal compressibility,
respiratory phasicity and response to augmentation.

Calf Veins: No evidence of thrombus. Normal compressibility and flow
on color Doppler imaging.

Superficial Great Saphenous Vein: No evidence of thrombus. Normal
compressibility and flow on color Doppler imaging.

Venous Reflux:  None.

Other Findings:  None.
IMPRESSION: No evidence of deep venous thrombosis.

## 2014-04-29 LAB — BASIC METABOLIC PANEL
Anion Gap: 6 — ABNORMAL LOW (ref 7–16)
BUN: 14 mg/dL (ref 7–18)
Calcium, Total: 8.4 mg/dL — ABNORMAL LOW (ref 8.5–10.1)
Chloride: 107 mmol/L (ref 98–107)
Co2: 27 mmol/L (ref 21–32)
Creatinine: 0.87 mg/dL (ref 0.60–1.30)
EGFR (African American): >60
EGFR (Non-African Amer.): >60
Glucose: 141 mg/dL — ABNORMAL HIGH (ref 65–99)
Osmolality: 282 (ref 275–301)
Potassium: 3.8 mmol/L (ref 3.5–5.1)
Sodium: 140 mmol/L (ref 136–145)

## 2014-04-29 LAB — CBC WITH DIFFERENTIAL/PLATELET
Basophil #: 0.1 10*3/uL (ref 0.0–0.1)
Basophil %: 1.2 %
Eosinophil #: 0.4 10*3/uL (ref 0.0–0.7)
Eosinophil %: 4.3 %
HCT: 43.3 % (ref 40.0–52.0)
HGB: 14.3 g/dL (ref 13.0–18.0)
Lymphocyte #: 3.2 10*3/uL (ref 1.0–3.6)
Lymphocyte %: 35.2 %
MCH: 29.2 pg (ref 26.0–34.0)
MCHC: 33 g/dL (ref 32.0–36.0)
MCV: 88 fL (ref 80–100)
Monocyte #: 0.9 x10 3/mm (ref 0.2–1.0)
Monocyte %: 10.1 %
Neutrophil #: 4.5 10*3/uL (ref 1.4–6.5)
Neutrophil %: 49.2 %
Platelet: 253 10*3/uL (ref 150–440)
RBC: 4.89 10*6/uL (ref 4.40–5.90)
RDW: 15.5 % — ABNORMAL HIGH (ref 11.5–14.5)
WBC: 9.2 10*3/uL (ref 3.8–10.6)

## 2014-04-29 LAB — CK: CK, Total: 386 U/L — ABNORMAL HIGH

## 2014-04-29 IMAGING — CT CT OF THE RIGHT TIBIA AND FIBULA WITH CONTRAST
4 of 5 series · 13 of 29 positions shown, 14 images · IV contrast (isovue)
Comparison: None.

CLINICAL DATA: Knee swelling for 4 weeks, worsening over the past
week. Increased drainage.

EXAM:
CT OF THE RIGHT TIBIA AND FIBULA WITH CONTRAST
TECHNIQUE: Contiguous axial CT images were obtained to the tibia/ fibula.
Multiplanar reformatting was performed.
CONTRAST:  100 cc Isovue 300

[Series 2: knee · axial · 0.43mm/px · z∈[+216,+472]mm · 4 of 285 slices shown, 5 images]
[im 57/285  soft-tissue]
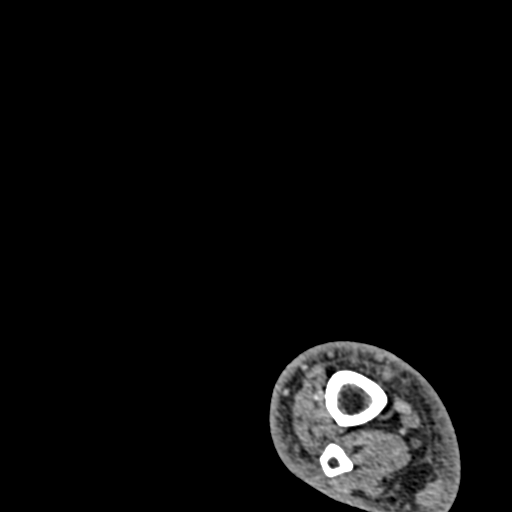
[im 57/285  bone]
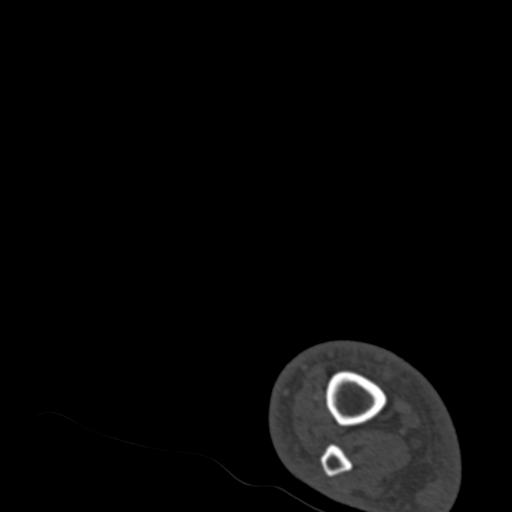
[im 114/285  bone]
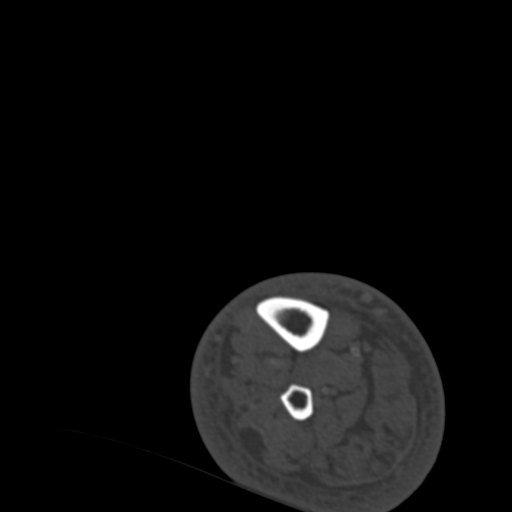
[im 171/285  bone]
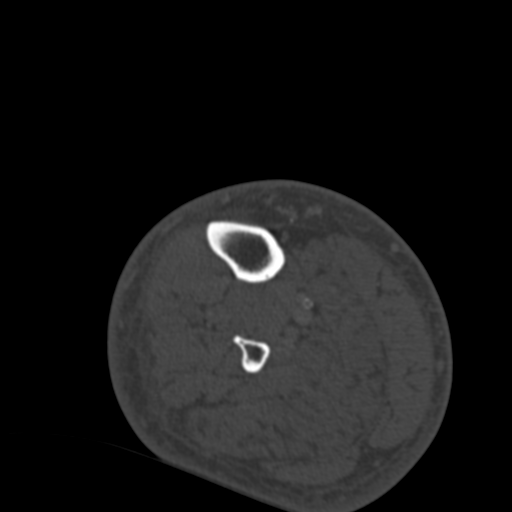
[im 228/285  bone]
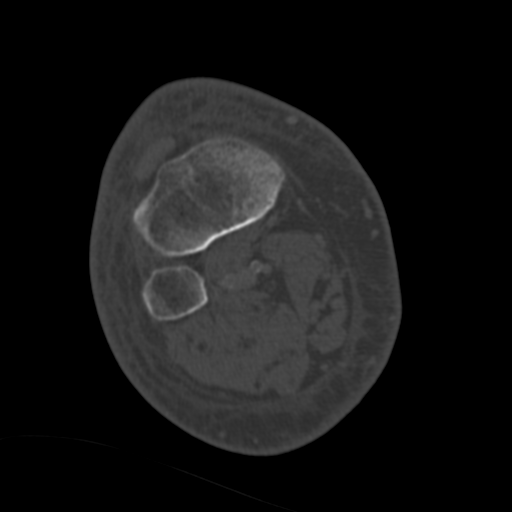

[Series 604: st axial -- · axial · 0.83mm/px · z∈[+213,+350]mm · 2 of 220 slices shown]
[im 74/220  bone]
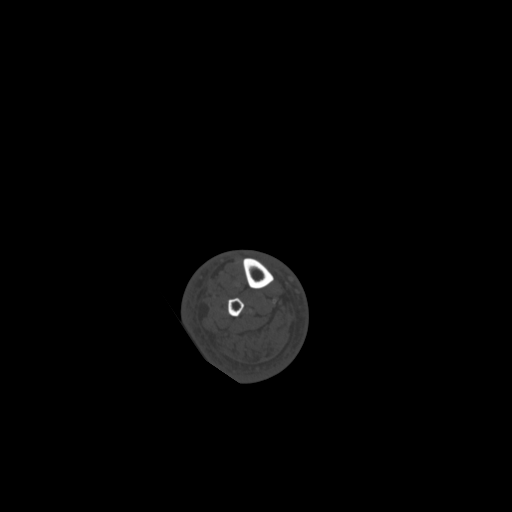
[im 147/220  bone]
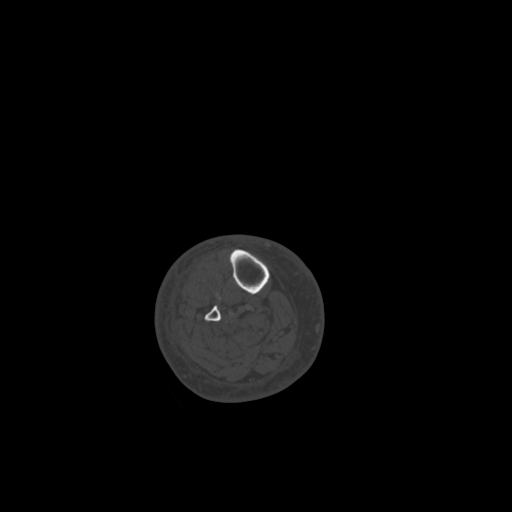

[Series 608: bone sagittal · sagittal · 0.83mm/px · 5 of 59 slices shown]
[im 16/59  bone]
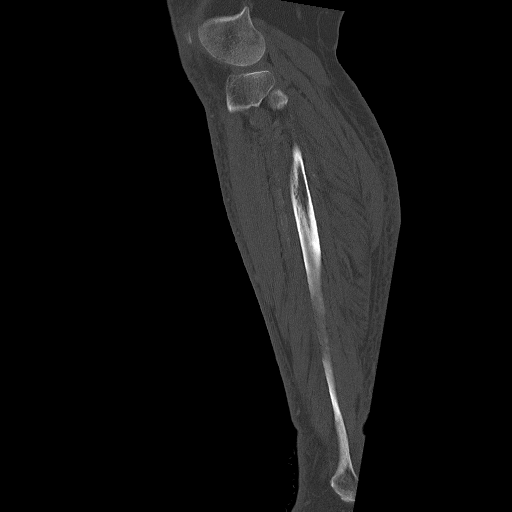
[im 22/59  bone]
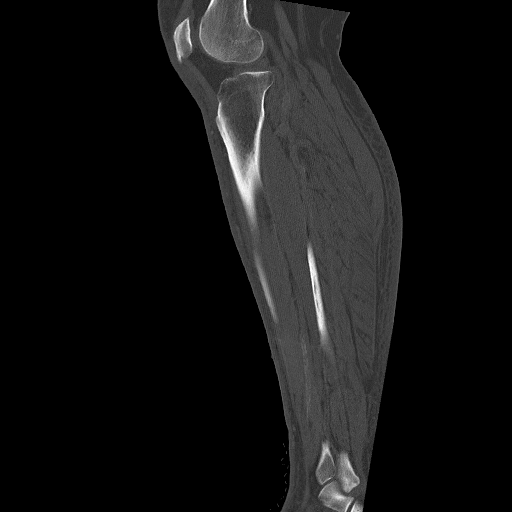
[im 27/59  bone]
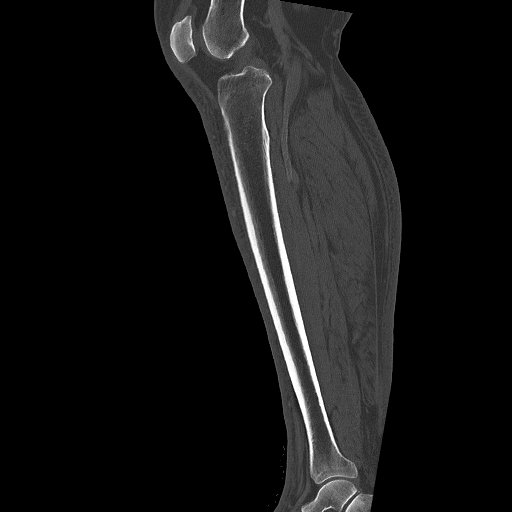
[im 32/59  bone]
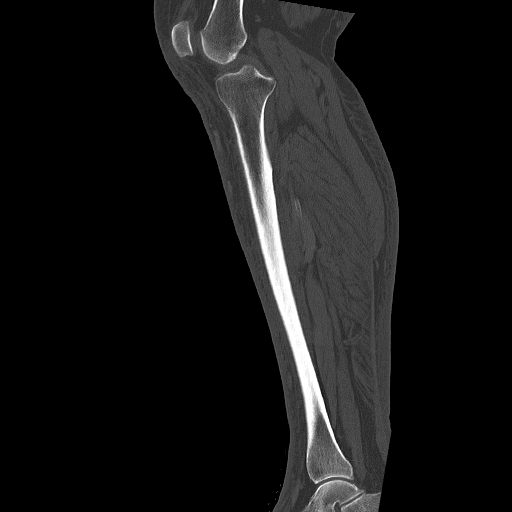
[im 37/59  bone]
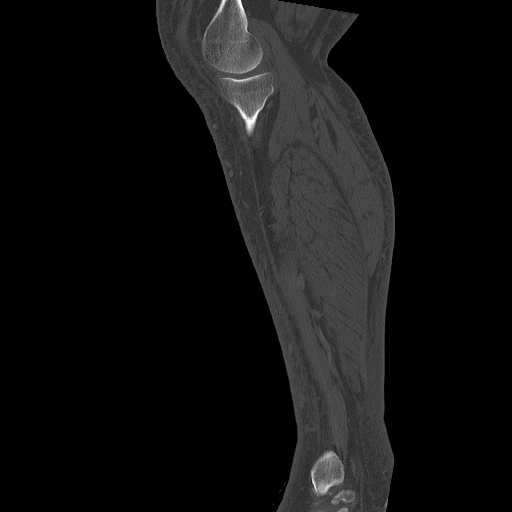

[Series 609: bone axial · axial · 0.83mm/px · z∈[+198,+347]mm · 2 of 231 slices shown]
[im 77/231  bone]
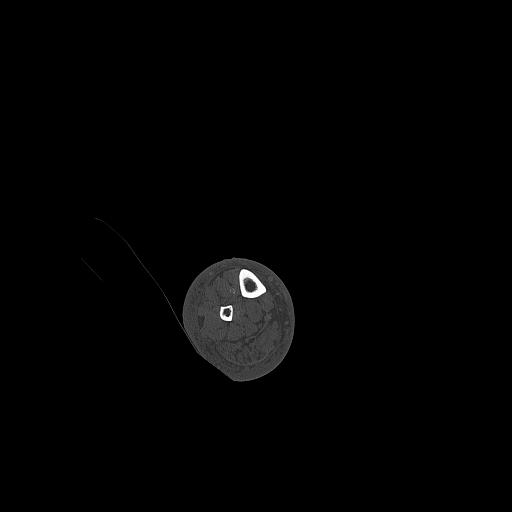
[im 154/231  bone]
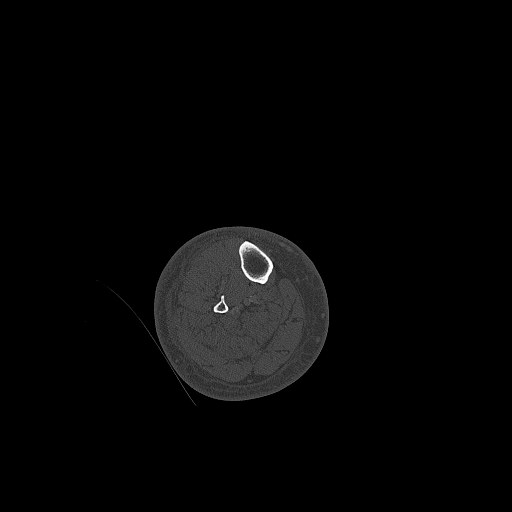

[13 of 29 positions shown; findings below may reference images not displayed]

FINDINGS: Diffuse subcutaneous edema in the calf. This is more confluent
distally. No significant CT abnormality of the visualized
musculature. No drainable abscess observed.

Mild patellar spurring. No CT findings of osteomyelitis. No
significant knee or tibiotalar joint effusion is observed. No
fracture or significant bony lesion identified.
IMPRESSION: 1. No findings of osteomyelitis, myositis, or abscess.
2. Diffuse subcutaneous edema in the calf, nonspecific for
cellulitis versus for lymphatic or venous drainage.
3. Mild patellar spurring.

## 2014-05-01 LAB — VANCOMYCIN, TROUGH: Vancomycin, Trough: 11 ug/mL (ref 10–20)

## 2014-05-03 LAB — CULTURE, BLOOD (SINGLE)

## 2014-05-19 ENCOUNTER — Ambulatory Visit: Payer: Self-pay | Admitting: Family

## 2014-06-08 ENCOUNTER — Telehealth: Payer: Self-pay | Admitting: *Deleted

## 2014-06-08 NOTE — Telephone Encounter (Signed)
Please call patient with [facility] number.

## 2014-06-08 NOTE — Telephone Encounter (Signed)
Spoke w/ pt.  He reports that he found the number on a magnet at home.

## 2014-07-14 ENCOUNTER — Ambulatory Visit: Payer: Medicare Other | Admitting: Cardiovascular Disease

## 2014-07-15 ENCOUNTER — Ambulatory Visit: Payer: Medicare Other | Admitting: Cardiovascular Disease

## 2014-07-17 ENCOUNTER — Encounter: Payer: Medicare Other | Admitting: Cardiovascular Disease

## 2014-07-17 ENCOUNTER — Encounter: Payer: Self-pay | Admitting: *Deleted

## 2014-07-17 ENCOUNTER — Encounter: Payer: Self-pay | Admitting: Cardiovascular Disease

## 2014-08-27 ENCOUNTER — Emergency Department: Payer: Self-pay | Admitting: Internal Medicine

## 2014-08-27 LAB — CBC
HCT: 43.6 % (ref 40.0–52.0)
HGB: 14.2 g/dL (ref 13.0–18.0)
MCH: 28.8 pg (ref 26.0–34.0)
MCHC: 32.6 g/dL (ref 32.0–36.0)
MCV: 88 fL (ref 80–100)
Platelet: 228 10*3/uL (ref 150–440)
RBC: 4.95 10*6/uL (ref 4.40–5.90)
RDW: 16 % — ABNORMAL HIGH (ref 11.5–14.5)
WBC: 10.1 10*3/uL (ref 3.8–10.6)

## 2014-08-27 LAB — URINALYSIS, COMPLETE
Bacteria: NONE SEEN
Bilirubin,UR: NEGATIVE
Blood: NEGATIVE
Glucose,UR: NEGATIVE mg/dL (ref 0–75)
Ketone: NEGATIVE
Leukocyte Esterase: NEGATIVE
Nitrite: NEGATIVE
Ph: 7 (ref 4.5–8.0)
Protein: NEGATIVE
RBC,UR: NONE SEEN /HPF (ref 0–5)
Specific Gravity: 1.015 (ref 1.003–1.030)
Squamous Epithelial: NONE SEEN
WBC UR: 1 /HPF (ref 0–5)

## 2014-08-27 LAB — COMPREHENSIVE METABOLIC PANEL
Albumin: 3.4 g/dL (ref 3.4–5.0)
Alkaline Phosphatase: 89 U/L
Anion Gap: 7 (ref 7–16)
BUN: 19 mg/dL — ABNORMAL HIGH (ref 7–18)
Bilirubin,Total: 0.4 mg/dL (ref 0.2–1.0)
Calcium, Total: 8.7 mg/dL (ref 8.5–10.1)
Chloride: 104 mmol/L (ref 98–107)
Co2: 29 mmol/L (ref 21–32)
Creatinine: 1.03 mg/dL (ref 0.60–1.30)
EGFR (African American): >60
EGFR (Non-African Amer.): >60
Glucose: 138 mg/dL — ABNORMAL HIGH (ref 65–99)
Osmolality: 284 (ref 275–301)
Potassium: 3.6 mmol/L (ref 3.5–5.1)
SGOT(AST): 32 U/L (ref 15–37)
SGPT (ALT): 56 U/L
Sodium: 140 mmol/L (ref 136–145)
Total Protein: 7.3 g/dL (ref 6.4–8.2)

## 2014-08-27 LAB — TROPONIN I: Troponin-I: 0.02 ng/mL

## 2014-08-27 LAB — PRO B NATRIURETIC PEPTIDE: B-Type Natriuretic Peptide: 356 pg/mL — ABNORMAL HIGH (ref 0–125)

## 2014-08-27 IMAGING — US US EXTREM LOW VENOUS*R*
1 series · 13 of 24 positions shown · non-contrast
Comparison: None.

CLINICAL DATA: Right lower extremity edema and cellulitis.



[Series 1: us extrem low venous*right* · 0.11mm/px · 13 of 36 slices shown]
[im 1/36]
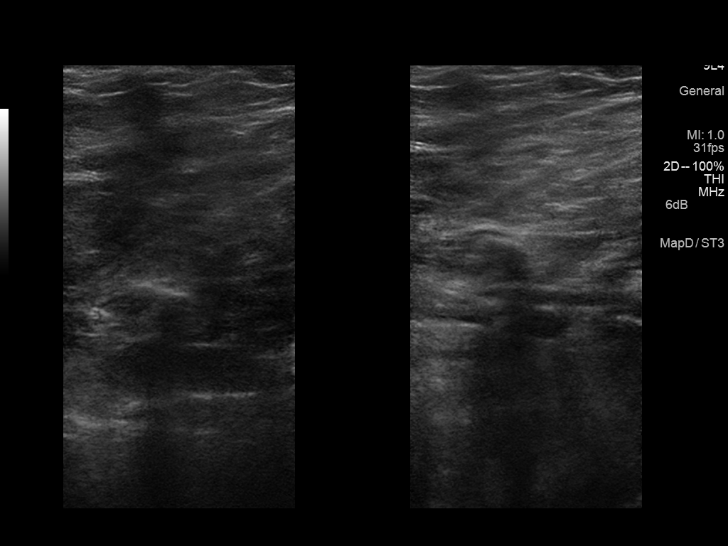
[im 4/36]
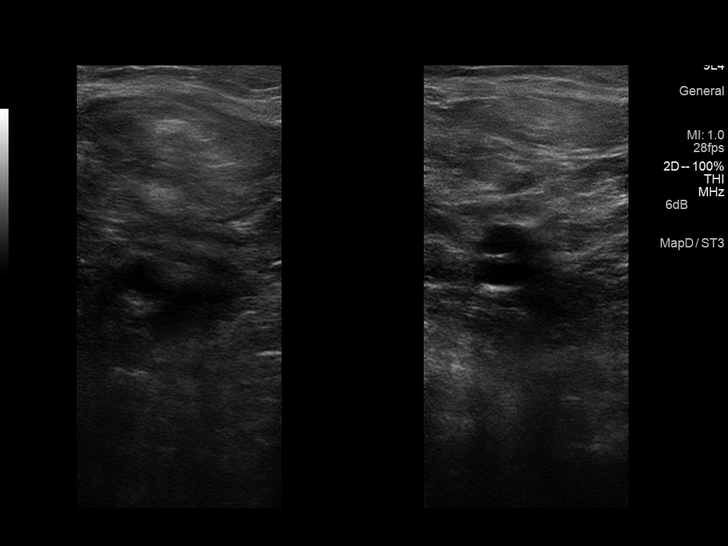
[im 7/36]
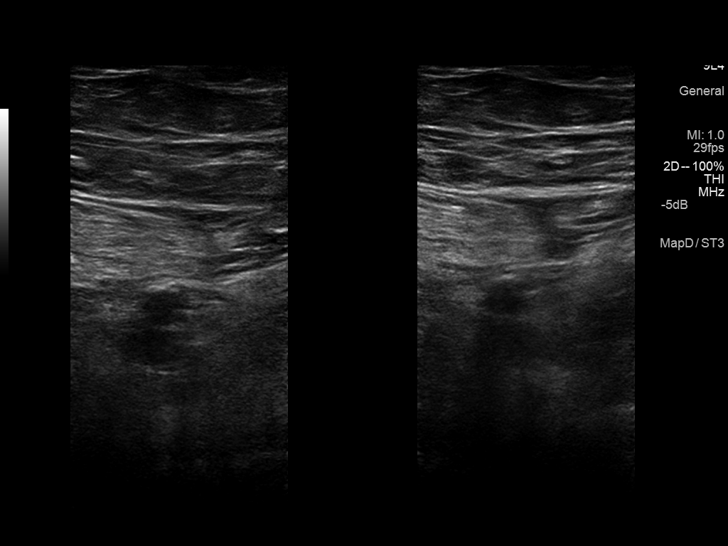
[im 10/36]
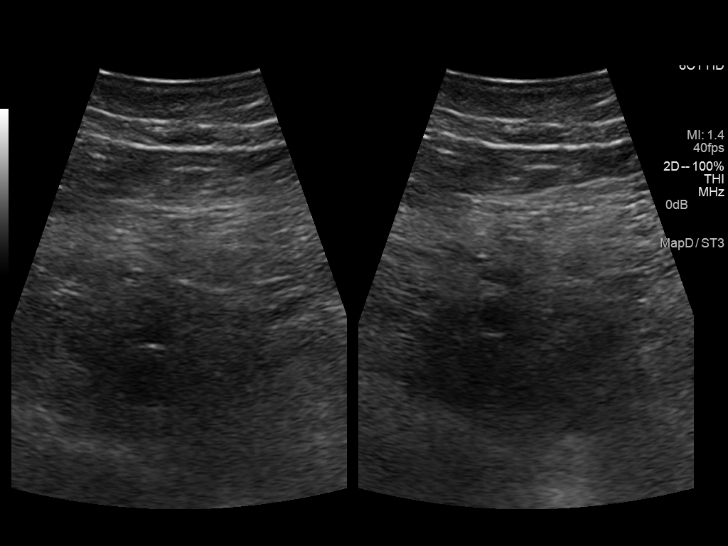
[im 13/36]
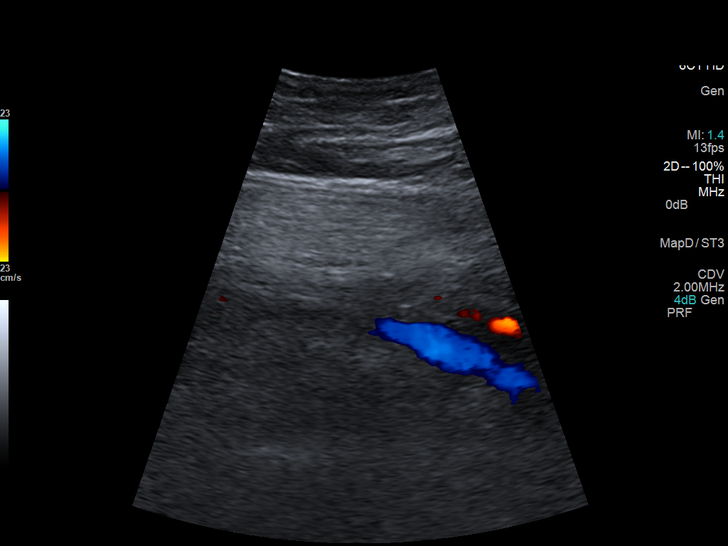
[im 16/36]
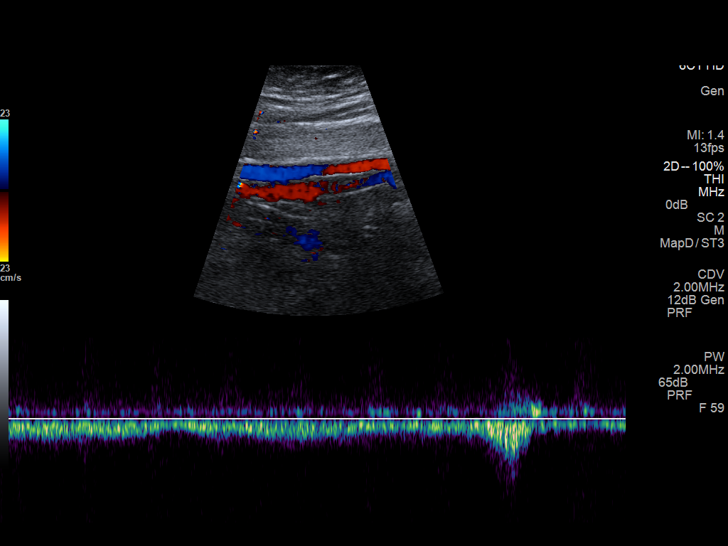
[im 19/36]
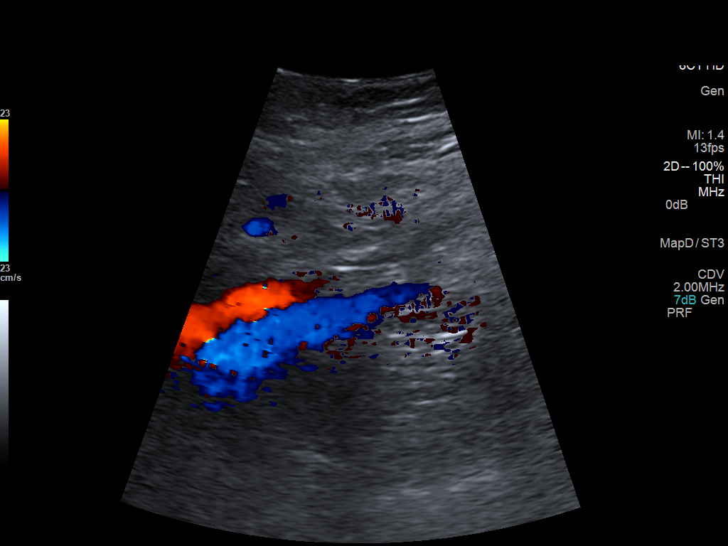
[im 20/36]
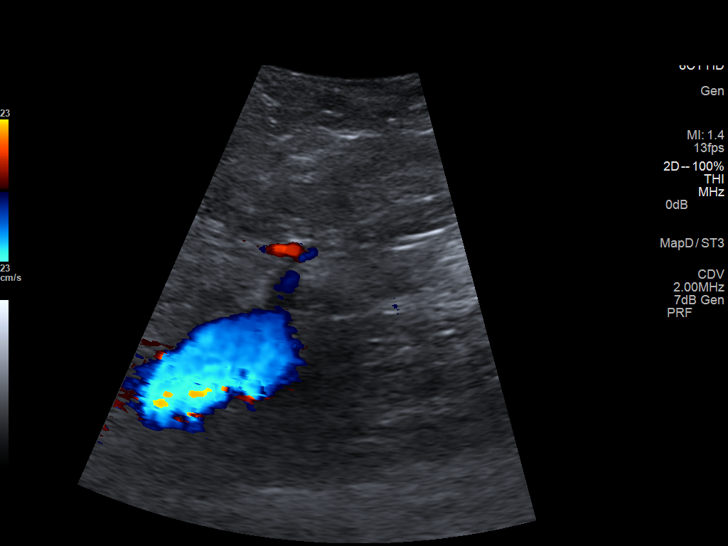
[im 23/36]
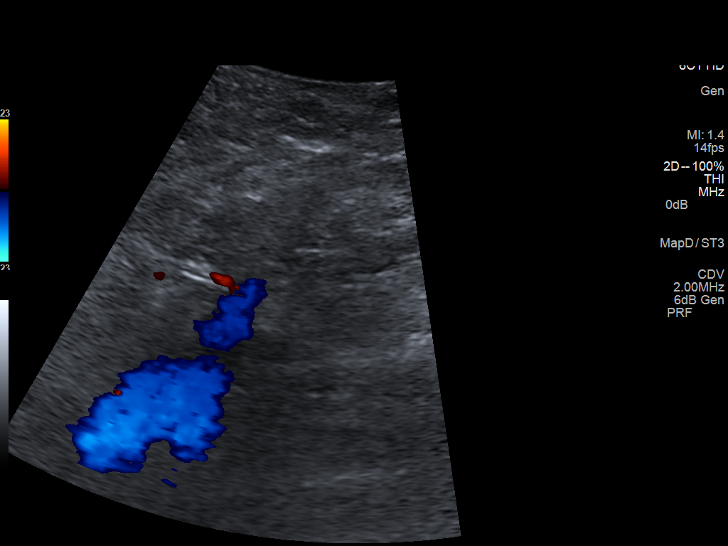
[im 26/36]
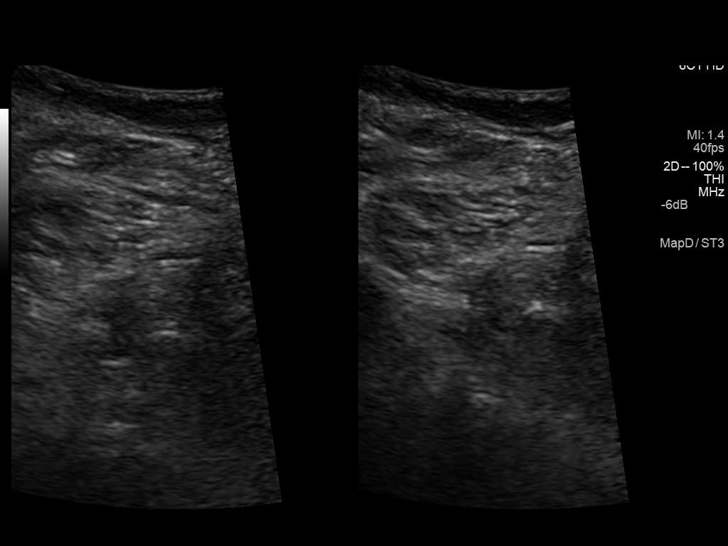
[im 29/36]
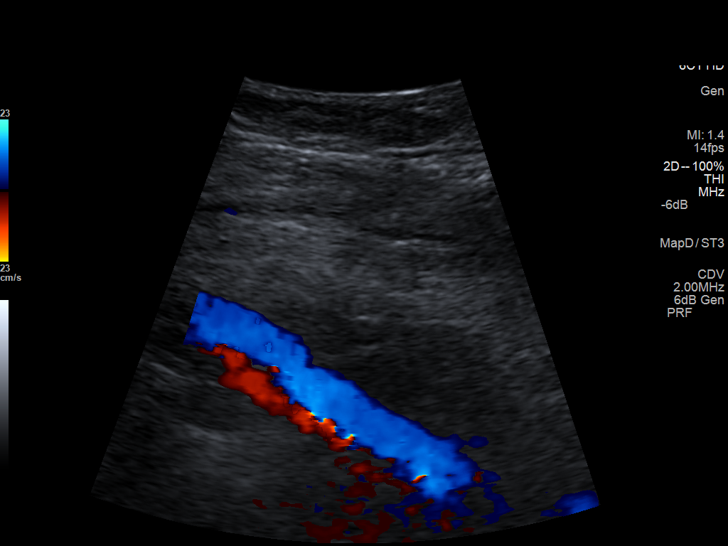
[im 32/36]
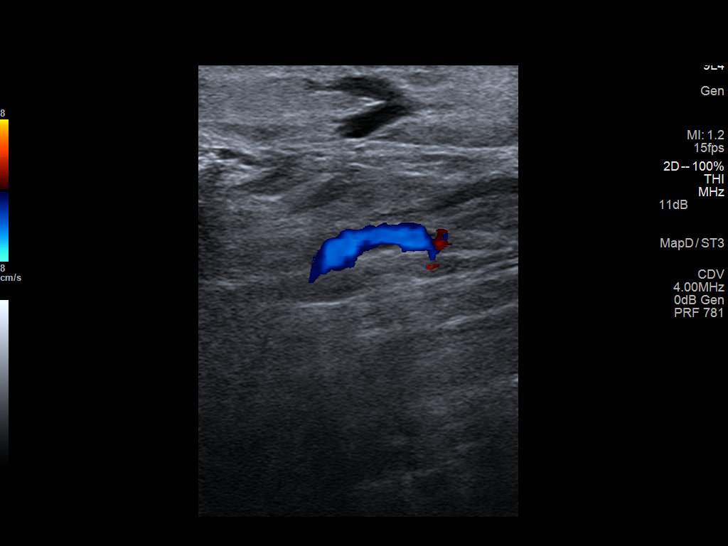
[im 36/36]
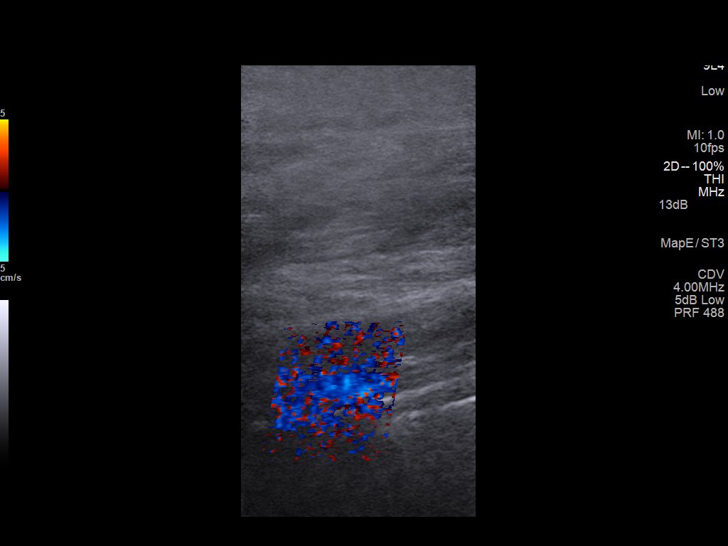

[13 of 24 positions shown; findings below may reference images not displayed]

FINDINGS: Contralateral Common Femoral Vein: Respiratory phasicity is normal
and symmetric with the symptomatic side. No evidence of thrombus.
Normal compressibility.

Common Femoral Vein: No evidence of thrombus. Normal
compressibility, respiratory phasicity and response to augmentation.

Saphenofemoral Junction: No evidence of thrombus. Normal
compressibility and flow on color Doppler imaging.

Profunda Femoral Vein: No evidence of thrombus. Normal
compressibility and flow on color Doppler imaging.

Femoral Vein: No evidence of thrombus. Normal compressibility,
respiratory phasicity and response to augmentation.

Popliteal Vein: No evidence of thrombus. Normal compressibility,
respiratory phasicity and response to augmentation.

Calf Veins: No evidence of thrombus. Normal compressibility and flow
on color Doppler imaging.

Superficial Great Saphenous Vein: No evidence of thrombus. Normal
compressibility and flow on color Doppler imaging.

Venous Reflux:  None.

Other Findings: No evidence of superficial thrombophlebitis or
abnormal fluid collection. Some small probable varicosities are
visualized in the subcutaneous tissues of the calf.
IMPRESSION: No evidence of right lower extremity deep venous thrombosis. Small
varicosities of the calf without evidence superficial
thrombophlebitis.

## 2014-08-27 IMAGING — CR DG CHEST 1V PORT
1 series · 1 of 1 positions shown · non-contrast
Comparison: [DATE]

CLINICAL DATA: Shortness of Breath

EXAM:
PORTABLE CHEST - 1 VIEW

[ap]
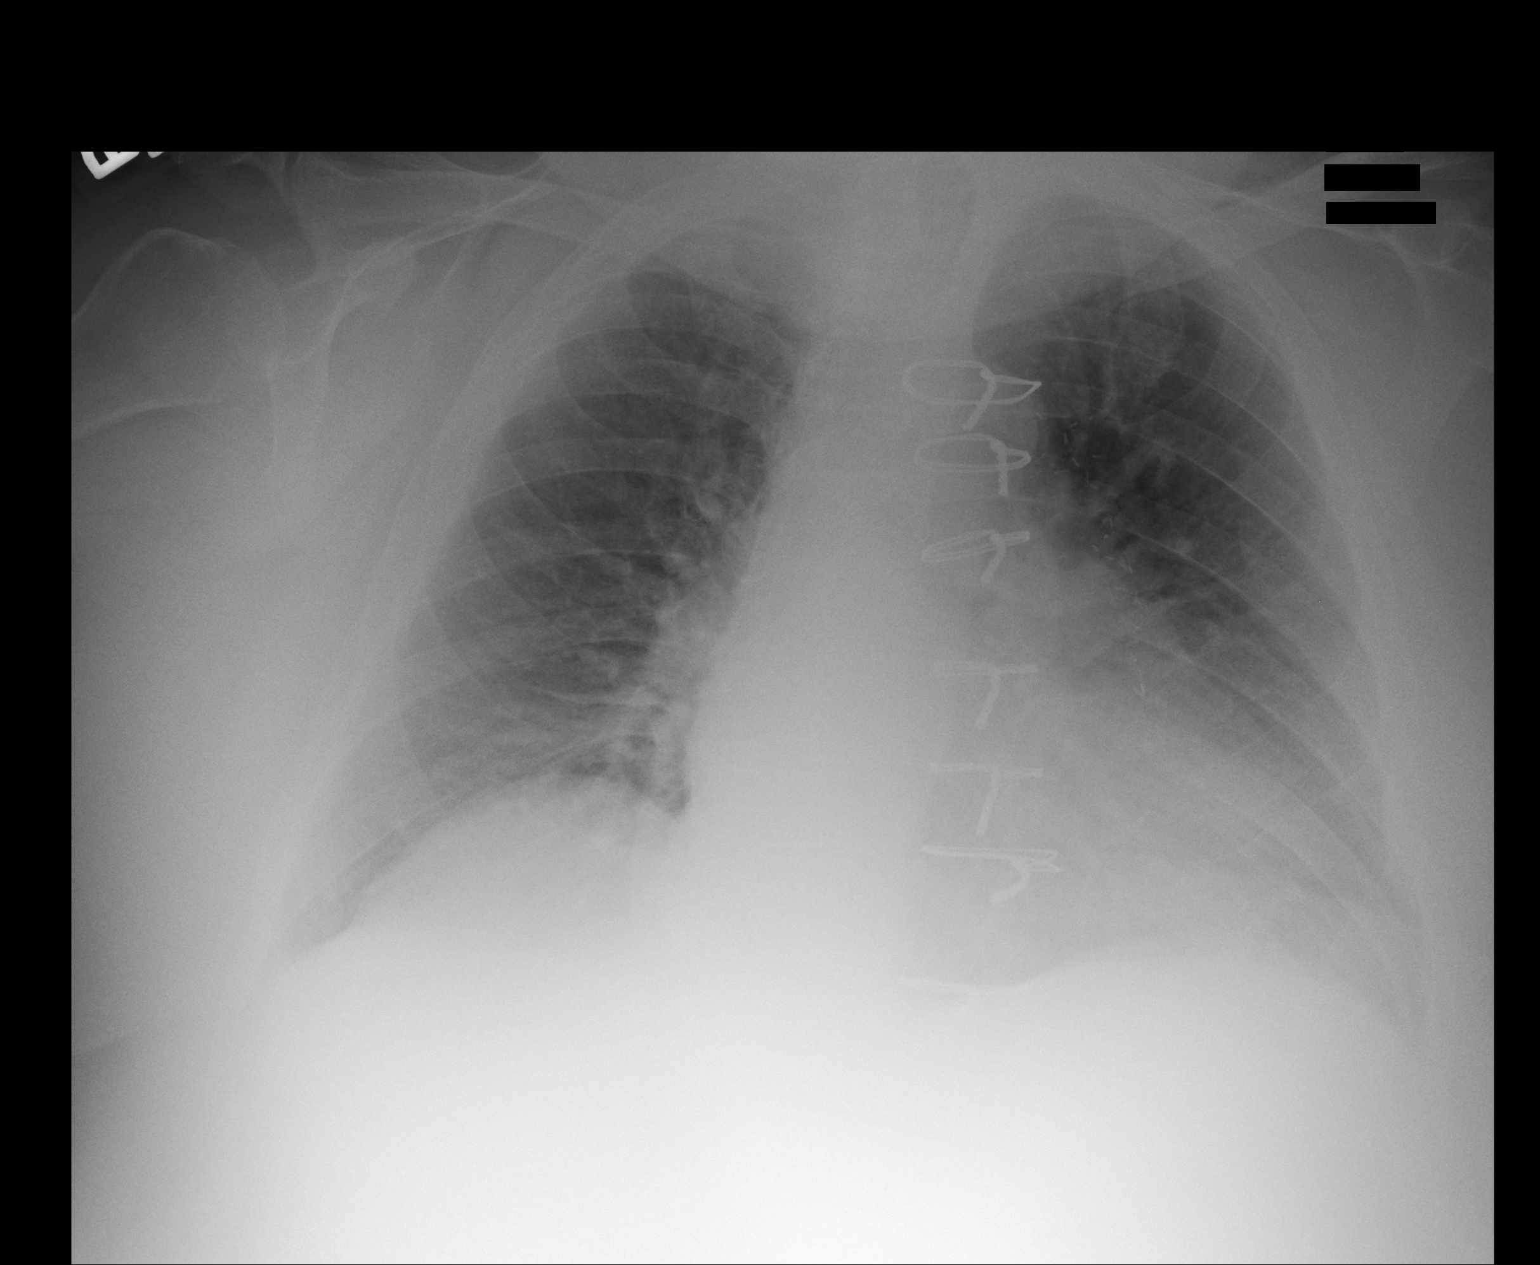

[1 of 1 positions shown; findings below may reference images not displayed]

FINDINGS: There is no edema or consolidation. Heart is mildly enlarged with
pulmonary vascularity within normal limits. No adenopathy. Patient
is status post internal mammary bypass grafting.
IMPRESSION: Stable cardiomegaly.  No edema or consolidation.

## 2014-08-27 IMAGING — CT CT ANGIO CHEST
2 of 6 series · 19 of 46 positions shown · IV contrast (APPLIED)
Comparison: [DATE]

CLINICAL DATA: shortness of breath and right lower leg pain. pt
with hx of cellulitis to right lower leg.pt a/ox3

EXAM:
CT ANGIOGRAPHY CHEST WITH CONTRAST
TECHNIQUE: Multidetector CT imaging of the chest was performed using the
standard protocol during bolus administration of intravenous
contrast. Multiplanar CT image reconstructions and MIPs were
obtained to evaluate the vascular anatomy.
CONTRAST:  100 mL Omnipaque 350 IV

[Series 6: pe thins 1.5 · axial · 0.91mm/px · z∈[-498,-204]mm · 16 of 275 slices shown]
[im 15/275  lung]
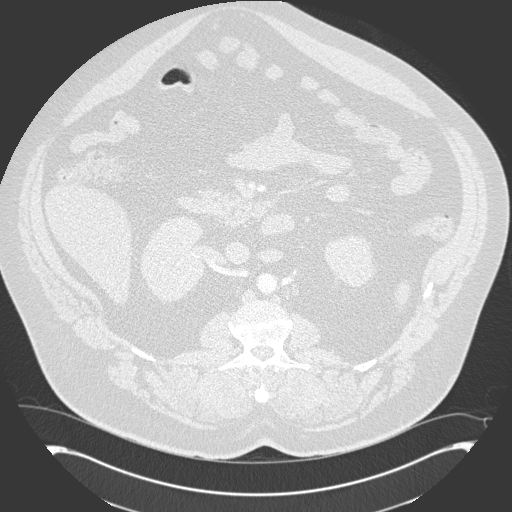
[im 29/275  soft-tissue]
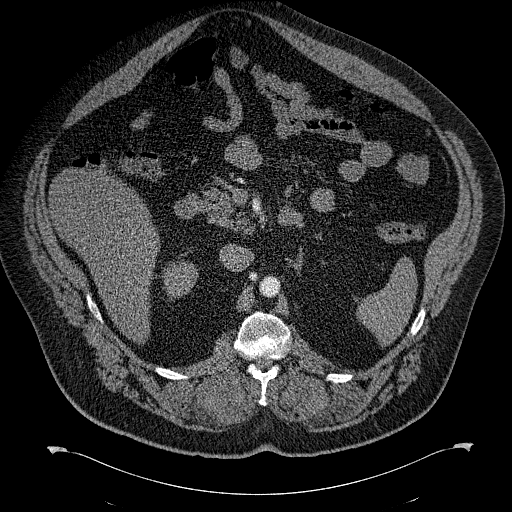
[im 44/275  lung]
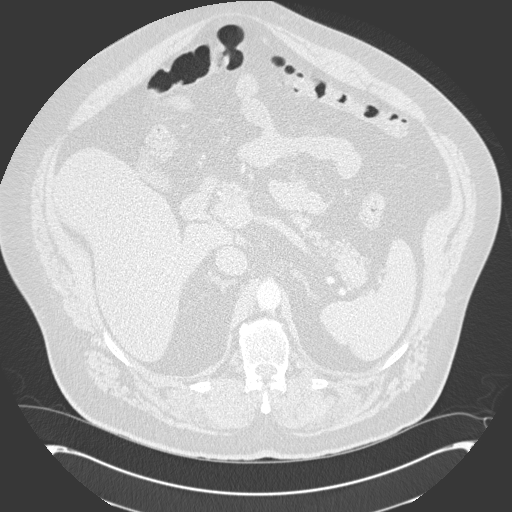
[im 58/275  soft-tissue]
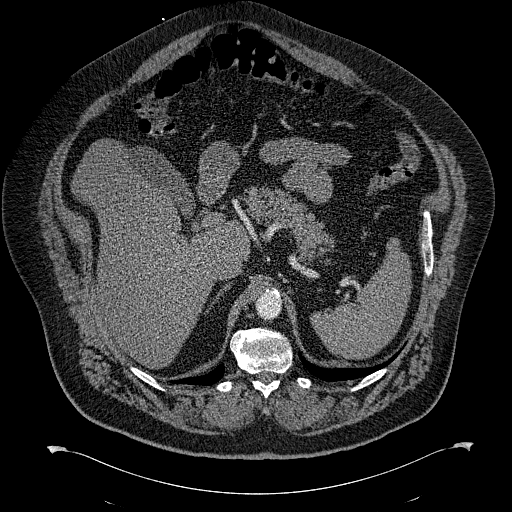
[im 87/275  lung]
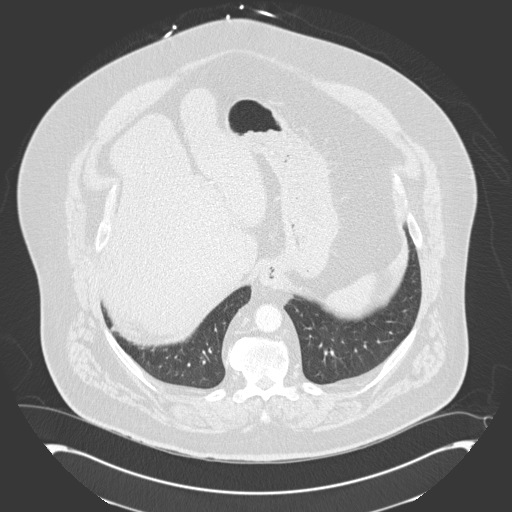
[im 101/275  soft-tissue]
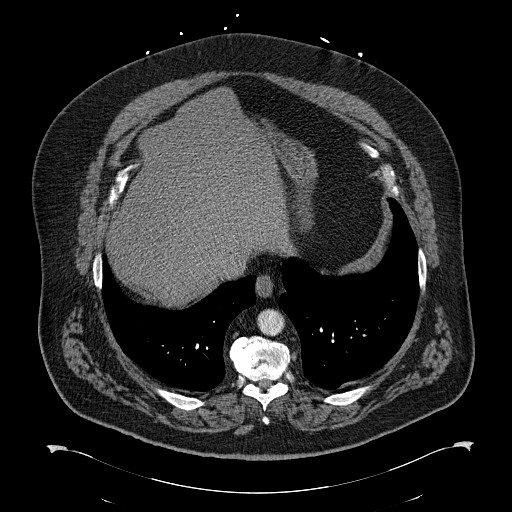
[im 116/275  lung]
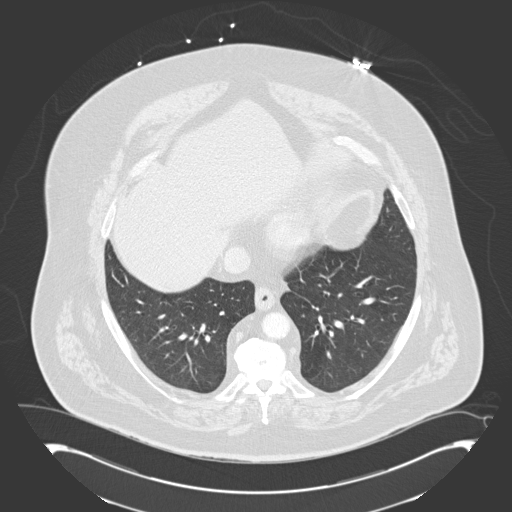
[im 130/275  soft-tissue]
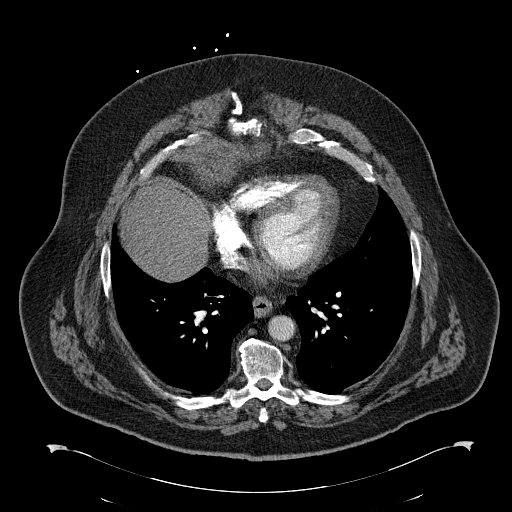
[im 145/275  lung]
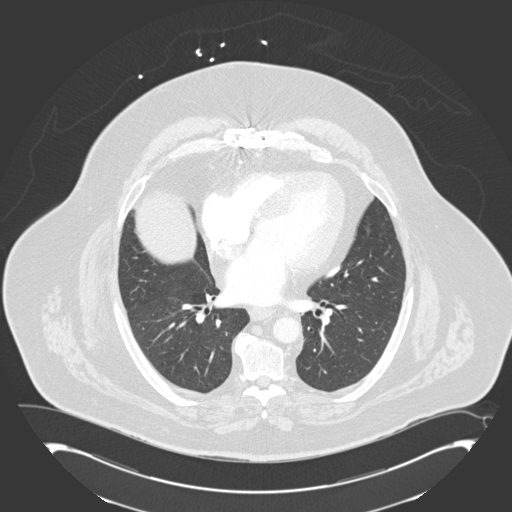
[im 159/275  soft-tissue]
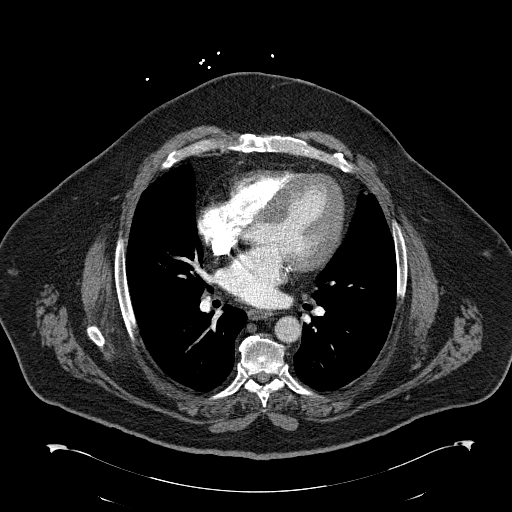
[im 174/275  lung]
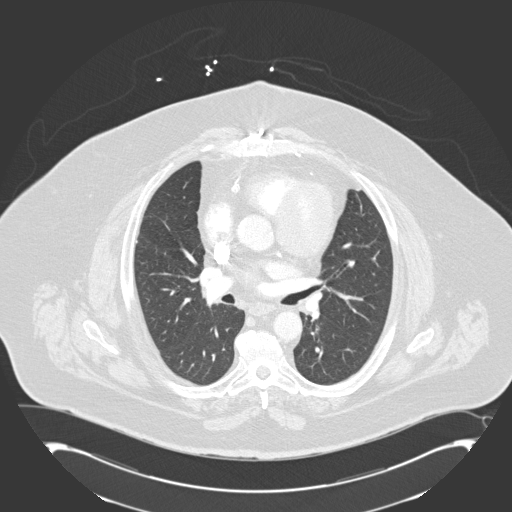
[im 188/275  soft-tissue]
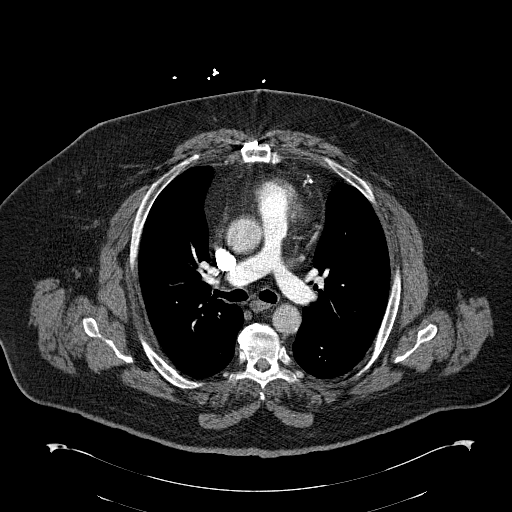
[im 217/275  lung]
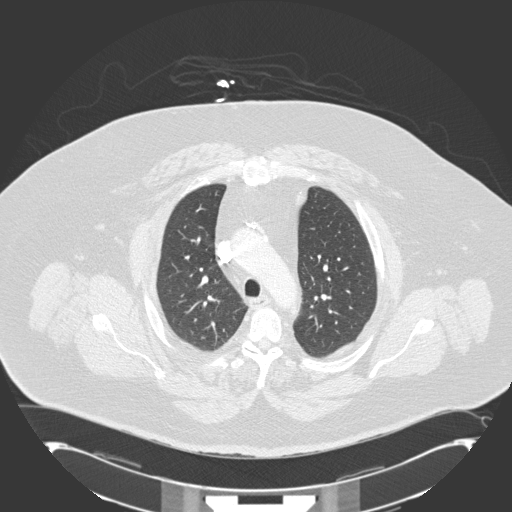
[im 231/275  soft-tissue]
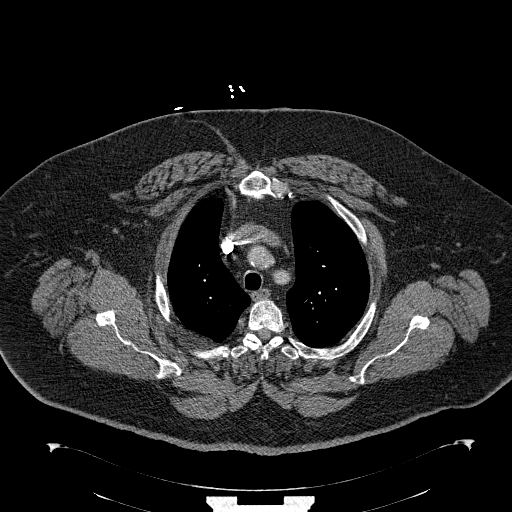
[im 246/275  lung]
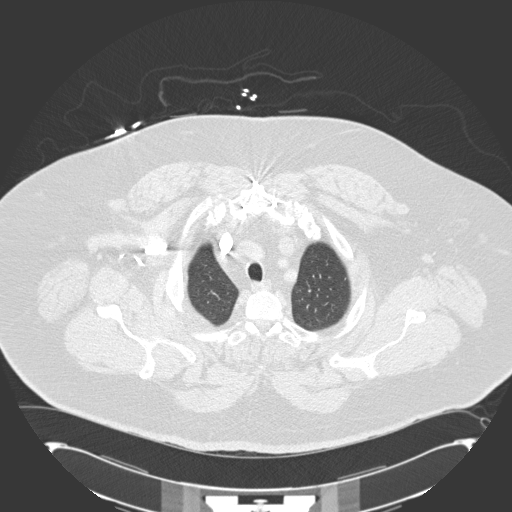
[im 260/275  soft-tissue]
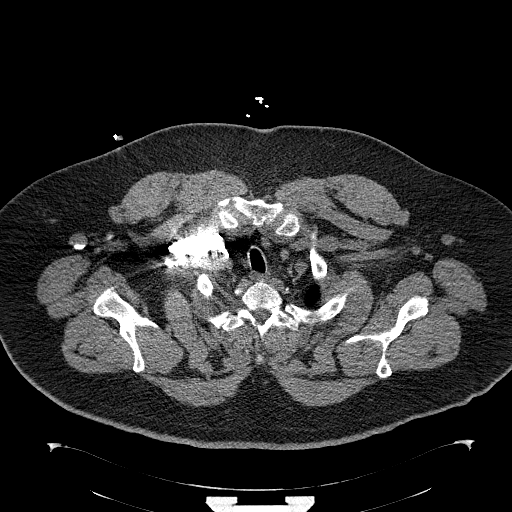

[Series 8: cor mpr 2.0 · coronal · 0.71mm/px · 3 of 183 slices shown]
[im 46/183  soft-tissue]
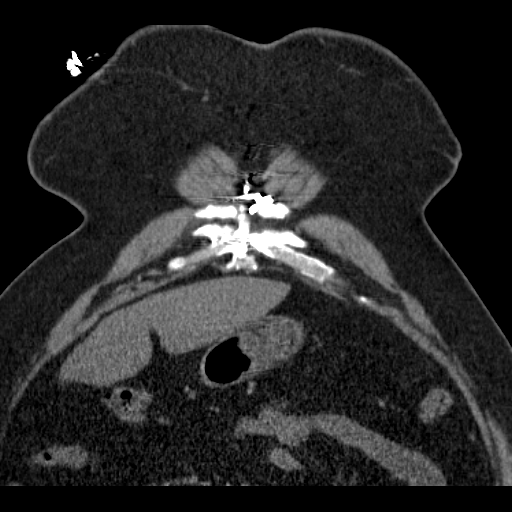
[im 92/183  soft-tissue]
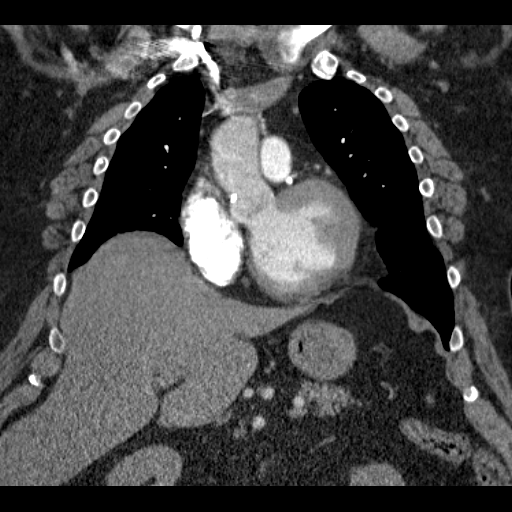
[im 137/183  soft-tissue]
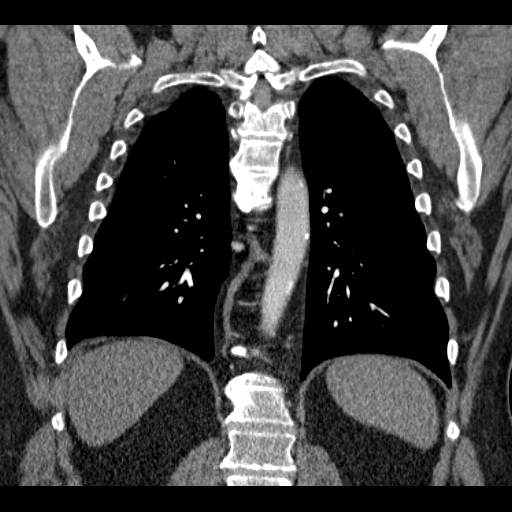

[19 of 46 positions shown; findings below may reference images not displayed]

FINDINGS: Previous median sternotomy and CABG. The SVC is patent. RV/LV ratio
normal, less than 1. Satisfactory opacification of pulmonary
arteries noted, and there is no evidence of pulmonary emboli. Patent
superior and inferior pulmonary veins bilaterally. Adequate contrast
opacification of the thoracic aorta with no evidence of dissection,
aneurysm, or stenosis. There is classic 3 vessel brachiocephalic
arch anatomy without proximal stenosis.

No pleural or pericardial effusion. No mediastinal or hilar
adenopathy. Lungs are clear. Bridging osteophytes across multiple
contiguous levels in the mid and lower thoracic spine. Subcentimeter
partially calcified stone(s) in the dependent aspect of the
gallbladder. Remainder visualized upper abdomen unremarkable.

Review of the MIP images confirms the above findings.
IMPRESSION: 1. Negative for acute PE or thoracic aortic dissection.
2. Cholelithiasis

## 2014-09-01 LAB — CULTURE, BLOOD (SINGLE)

## 2014-09-10 ENCOUNTER — Ambulatory Visit (INDEPENDENT_AMBULATORY_CARE_PROVIDER_SITE_OTHER): Payer: Medicare Other | Admitting: Cardiovascular Disease

## 2014-09-10 ENCOUNTER — Encounter: Payer: Self-pay | Admitting: Cardiovascular Disease

## 2014-09-10 VITALS — BP 130/80 | HR 87 | Ht 65.0 in | Wt 292.5 lb

## 2014-09-10 DIAGNOSIS — I25810 Atherosclerosis of coronary artery bypass graft(s) without angina pectoris: Secondary | ICD-10-CM

## 2014-09-10 DIAGNOSIS — I10 Essential (primary) hypertension: Secondary | ICD-10-CM

## 2014-09-10 DIAGNOSIS — R0602 Shortness of breath: Secondary | ICD-10-CM

## 2014-09-10 DIAGNOSIS — I872 Venous insufficiency (chronic) (peripheral): Secondary | ICD-10-CM

## 2014-09-10 DIAGNOSIS — E782 Mixed hyperlipidemia: Secondary | ICD-10-CM

## 2014-09-10 DIAGNOSIS — I739 Peripheral vascular disease, unspecified: Secondary | ICD-10-CM

## 2014-09-10 DIAGNOSIS — I5032 Chronic diastolic (congestive) heart failure: Secondary | ICD-10-CM

## 2014-09-10 NOTE — Assessment & Plan Note (Signed)
Encouraged him to stay on his simvastatin.

## 2014-09-10 NOTE — Assessment & Plan Note (Signed)
Blood pressure is well controlled on today's visit. No changes made to the medications. 

## 2014-09-10 NOTE — Assessment & Plan Note (Signed)
Recommended he stay on Lasix 80 mg in the a.m. and 40 mg in the afternoon. Significant fluid indiscretion. Recommended he cut back on his fluids.

## 2014-09-10 NOTE — Patient Instructions (Signed)
You are doing well. No medication changes were made.  We will order a lower extremity arterial ultrasound/ABIs for claudication  Start wearing a compression hose  Please call us if you have new issues that need to be addressed before your next appt.  Your physician wants you to follow-up in: 6 months.  You will receive a reminder letter in the mail two months in advance. If you don't receive a letter, please call our office to schedule the follow-up appointment.

## 2014-09-10 NOTE — Assessment & Plan Note (Addendum)
Severe symptoms of the right lower extremity. Recommended he wear compression hose daily, leg elevation. If he continues to have blistering and oozing despite wearing compression hose, would refer him to vein and vascular

## 2014-09-10 NOTE — Progress Notes (Signed)
Patient ID: Evan [name], male    DOB: [birth year], 59 y.o.   MRN: [id number]  HPI Comments: Evan [name] is a 59 year old gentleman with morbid obesity, chronic diastolic CHF,  coronary artery disease with bypass surgery in 2009 with 6 vessel bypass, numerous stenting dating back to the early 2000 , on disability. He has chronic right lower extremity swelling. History of obstructive sleep apnea, uses CPAP nightly History of anxiety and obsessive-compulsive disorder  History of medication noncompliance secondary to financial issues  In followup today, he reports that he was recently at [facility] 08/27/2014. He was seen in the emergency room for right lower extremity swelling, a chronic issue. He reports it was worse than his usual baseline. He had Doppler ultrasound showing no DVT, CT scan of the chest showing no PE, gallstones noted. Discharged home with follow-up today  He reports that his right lower extremity edema is worse than before. He is not wearing compression hose, sits with his legs down. No regular exercise, some abdominal swelling, itching. Primary care recently increased Lasix up to 80 mg in the morning, 40 mg in the evening. Blood work done in the hospital 08/27/2014 shows creatinine 1.03, BUN 19, BNP 356, glucose 138, normal LFTs He does take significant fluids in the daytime as his mouth is always dry  EKG on today's visit shows normal sinus rhythm with rate 87 bpm, no significant ST or T-wave changes  Other past medical history  at the end of August 2015 with symptoms of shortness of breath.  Triage note reported he was coughing up phlegm, burning throat, "chest was filling up with fluid " EKG 04/01/2014 showed normal sinus rhythm with PVCs Chest x-ray showing edema Creatinine 1.1, negative cardiac enzymes,   He was treated with diuretics with improvement of his symptoms   Previously advised to have a stress test February 2015 but did not followup secondary to financial  issues He presented to the hospital Dec 30 2013 with cough,  lower extremity edema. He was treated with gentle diuresis and discharged on Lasix 40 mg daily.  Zoloft dosing was increased in the hospital as it was felt he was having some anxiety attacks  Echocardiogram may 25th 2015 showing ejection fraction 50-55%, unable to estimate right ventricular systolic pressure No evidence of DVT on the right  Surgical details he has LIMA to the LAD, vein graft to the diagonal, vein graft to the OM, vein graft to the ramus, vein graft to OM 2 and vein graft to the distal RCA    Allergies  Allergen Reactions  . Penicillin G Hives  . Sulfa Antibiotics Hives    Outpatient Encounter Prescriptions as of 09/10/2014  Medication Sig  . albuterol (PROVENTIL) (2.5 MG/3ML) 0.083% nebulizer solution Take 2.5 mg by nebulization every 6 (six) hours as needed for wheezing or shortness of breath.  Marland Kitchen aspirin 81 MG EC tablet Take 81 mg by mouth 2 (two) times daily.   . clindamycin (CLEOCIN) 300 MG capsule Take 300 mg by mouth 4 (four) times daily.   Marland Kitchen esomeprazole (NEXIUM) 40 MG capsule Take 40 mg by mouth daily at 12 noon.  . fluticasone (FLONASE) 50 MCG/ACT nasal spray Place 1 spray into both nostrils daily.   . furosemide (LASIX) 40 MG tablet Take 80 mg in the am with 40 mg in the pm.  . hydrOXYzine (ATARAX/VISTARIL) 25 MG tablet Take 25 mg by mouth at bedtime.  . metoprolol tartrate (LOPRESSOR) 25 MG tablet Take 25 mg by  mouth 2 (two) times daily.    . polyethylene glycol powder (GLYCOLAX/MIRALAX) powder Take 1 Container by mouth daily.   . potassium chloride (K-DUR) 10 MEQ tablet Take 1 tablet (10 mEq total) by mouth daily.  Marland Kitchen PROAIR HFA 108 (90 BASE) MCG/ACT inhaler Inhale 1 puff into the lungs every 4 (four) hours as needed.   . ranitidine (ZANTAC) 150 MG capsule Take 1 capsule (150 mg total) by mouth 2 (two) times daily.  . simvastatin (ZOCOR) 40 MG tablet Take 1 tablet (40 mg total) by mouth daily.  .  [DISCONTINUED] furosemide (LASIX) 40 MG tablet Take 1 tablet (40 mg total) by mouth 2 (two) times daily as needed. (Patient not taking: Reported on 09/10/2014)    Past Medical History  Diagnosis Date  . CAD (coronary artery disease)   . Hypertension   . Hyperlipidemia   . Gastroesophageal reflux   . Obstructive sleep apnea   . History of pneumonia     Remote  . History of scarlet fever     Childhood  . Obesity   . Cellulitis     Past Surgical History  Procedure Laterality Date  . Cardiac catheterization    . Coronary artery bypass graft    . Coronary angioplasty with stent placement  2002  . Coronary angioplasty with stent placement  1999    Social History  reports that he has never smoked. He has never used smokeless tobacco. He reports that he does not drink alcohol or use illicit drugs.  Family History family history includes Coronary artery disease in his other; Heart attack in his mother; Heart attack (age of onset: 68) in his brother; Hyperlipidemia in his mother; Hypertension in his mother.   Review of Systems  Constitutional: Negative.   Respiratory: Positive for shortness of breath.   Cardiovascular: Positive for leg swelling.  Gastrointestinal: Negative.   Musculoskeletal: Negative.   Neurological: Negative.   Hematological: Negative.   Psychiatric/Behavioral: Negative.   All other systems reviewed and are negative.   BP 130/80 mmHg  Pulse 87  Ht 5\' 5"  (1.651 m)  Wt 292 lb 8 oz (132.677 kg)  BMI 48.67 kg/m2  Physical Exam  Constitutional: He is oriented to person, place, and time. He appears well-developed and well-nourished.  Obese  HENT:  Head: Normocephalic.  Nose: Nose normal.  Mouth/Throat: Oropharynx is clear and moist.  Eyes: Conjunctivae are normal. Pupils are equal, round, and reactive to light.  Neck: Normal range of motion. Neck supple. No JVD present.  Cardiovascular: Normal rate, regular rhythm, S1 normal, S2 normal, normal heart sounds  and intact distal pulses.  Exam reveals no gallop and no friction rub.   No murmur heard. Right lower extremity edema with  1+ pitting edema to below the knee  Pulmonary/Chest: Effort normal and breath sounds normal. No respiratory distress. He has no wheezes. He has no rales. He exhibits no tenderness.  Abdominal: Soft. Bowel sounds are normal. He exhibits no distension. There is no tenderness.  Musculoskeletal: Normal range of motion. He exhibits no edema or tenderness.  Lymphadenopathy:    He has no cervical adenopathy.  Neurological: He is alert and oriented to person, place, and time. Coordination normal.  Skin: Skin is warm and dry. No rash noted. No erythema.  Psychiatric: He has a normal mood and affect. His behavior is normal. Judgment and thought content normal.      Assessment and Plan   Nursing note and vitals reviewed.

## 2014-09-10 NOTE — Assessment & Plan Note (Signed)
We have encouraged continued exercise, careful diet management in an effort to lose weight. 

## 2014-09-10 NOTE — Assessment & Plan Note (Signed)
Currently with no symptoms of angina. No further workup at this time. Continue current medication regimen. 

## 2014-10-02 ENCOUNTER — Encounter (INDEPENDENT_AMBULATORY_CARE_PROVIDER_SITE_OTHER): Payer: Medicare Other

## 2014-10-02 DIAGNOSIS — R202 Paresthesia of skin: Secondary | ICD-10-CM

## 2014-10-02 DIAGNOSIS — R0602 Shortness of breath: Secondary | ICD-10-CM

## 2014-10-02 DIAGNOSIS — I739 Peripheral vascular disease, unspecified: Secondary | ICD-10-CM

## 2014-10-04 ENCOUNTER — Emergency Department: Payer: Self-pay | Admitting: Emergency Medicine

## 2014-10-07 ENCOUNTER — Telehealth: Payer: Self-pay | Admitting: *Deleted

## 2014-10-07 NOTE — Telephone Encounter (Signed)
Please see result note 

## 2014-10-07 NOTE — Telephone Encounter (Signed)
Note is in the chart, no blockages in his legs

## 2014-10-07 NOTE — Telephone Encounter (Signed)
Pt is asking about test he did on Friday, just asking about results Told him we are still reviewing it he said okay, he was just curious

## 2014-11-27 NOTE — Discharge Summary (Signed)
PATIENT NAME:  Evan [name], Evan [name] MR#:  [id number] DATE OF BIRTH:  November 08, 1955  DATE OF ADMISSION:  02/24/2013 DATE OF DISCHARGE:  02/24/2013  REASON FOR ADMISSION: Shortness of breath.   DISCHARGE DIAGNOSIS: Shortness of breath with atypical chest pain, likely related to anxiety and panic attack.   HOSPITAL COURSE: This is a 59 year old gentleman who has history of coronary artery disease. He is status post a coronary artery bypass graft in 2009. He is obese. He has hypertension and sleep apnea. The patient was seen by Dr. Conni Slipper on 02/24/2013 in the ER. Apparently, the patient woke up in the middle of the night feeling short of breath, coughing some mucus. Shortness of breath persisted, and since it did not improve, he got really panicked and also reported to have some pressure in the middle of the chest, in his own words he said not enough to call it chest pain, for which he came to the Emergency Department, where he was evaluated. There were no changes on his EKG. His vital signs were 120 of blood pressure, respiratory rate of 20 and a pulse of 62 with a temperature of 98.1. His physical exam was unremarkable, with normal breathing sounds, normal heart examination, no edema. Apparently, the patient was really cheerful and appeared to be really happy when he was in the ER. At that moment, his BNP was 600, his BUN was 16, his white count was 11,000 and his hemoglobin was 13. His D-dimer was negative, and his first set of cardiac enzymes was negative. This was described by the admitting doctor as shortness of breath related to self-limited episode with some chest pressure, unclear whether the symptoms were angina versus upper respiratory infection with mucous plugging and anxiety disorder.   The patient was kept as an observation, and his troponins were checked serially x3. Once the third set came up negative, the patient was discharged in good condition. We had a long conversation about the symptoms.  It all seemed to be related to anxiety. The patient has been very anxious. He feels like he has posttraumatic stress disorder after his bypass. The patient just recently stopped taking his Zoloft, and I recommended for him to take it back. The patient states that he has prescriptions for it, for which I do not need to give him a prescription for him to take, but he will be taking the medication the same way that his doctors have been prescribing them, without stopping them without consulting to his doctor first. Especially on this type of medications, it will cause rebound symptoms if you stop them suddenly.   DISCHARGE MEDICATIONS: The patient is discharged in good condition home with the following medications:  1. Ranitidine 100 mg twice daily.  2. Metoprolol 25 mg twice daily.  3. Simvastatin 40 mg once daily.  4. Aspirin 81 mg daily. 5. Sertraline 100 mg at bedtime.   FOLLOWUP: With cardiology at [place] at [facility], and the patient has been recommended to visit his primary care physician for prescription for Zoloft and management of his anxiety disorder. Recommended also to have an outpatient psychiatry visit if possible.    TIME SPENT: I spent about 60 minutes with this patient on the day of discharge.   ____________________________ Provencal Sink, MD rsg:OSi D: 02/25/2013 07:08:23 ET T: 02/25/2013 08:41:25 ET JOB#: [id number]  cc:  Sink, MD, <Dictator> Yevette Edwards, FNP Cristi Loron MD ELECTRONICALLY SIGNED 02/28/2013 20:48

## 2014-11-27 NOTE — H&P (Signed)
PATIENT NAME:  Evan [name], Evan [name] MR#:  [id number] DATE OF BIRTH:  [birth year]  DATE OF ADMISSION:  02/24/2013  PRIMARY CARE PHYSICIAN: [facility]   CARDIOLOGIST: [facility]  REFERRING PHYSICIAN: Conni Slipper, MD   CHIEF COMPLAINT: Shortness of breath.   HISTORY OF PRESENT ILLNESS: Mr. Doucet is a 59 year old Caucasian male with history of coronary artery disease status post coronary artery bypass graft in 2009, obesity, systemic hypertension and obstructive sleep apnea. The patient was in his usual state of health until last night when he woke up feeling short of breath and he was coughing some mucus. His shortness of breath persisted and he tried to settle down to see if this will fade away.  When it did not improve, he got panicked. He also reported mild pressure in the chest, but not enough to call it chest pain. This was self-limiting, went away. Earlier, he also reports that he carried the trash can and when he came back he felt chest pressure as if somebody was sitting on his chest, but that was self-limited and went away. He states that this is not unusual for him. He has this type of pain from time to time, and the only thing that bothered him is when he woke up at night feeling short of breath. There is no recurrence and there are no other associated symptoms. No syncope. No vomiting. No sweating. Evaluation at the Emergency Department was unremarkable. His first troponin is normal. There was a question whether his symptoms are simply upper respiratory tract infection that is self-limited versus angina equivalent.   REVIEW OF SYSTEMS: CONSTITUTIONAL: Denies any fever. No chills. No fatigue.  EYES: No blurring of vision. No double vision.  ENT: No hearing impairment. No sore throat. No dysphagia.  CARDIOVASCULAR: No genuine chest pain, but he had shortness of breath and some pressure in his chest, but was self-limited. No syncope. No palpitations.  RESPIRATORY: No hemoptysis. No chest  pain, but he has some shortness of breath and some mucus and sputum production.  GASTROINTESTINAL: No abdominal pain. No vomiting. No diarrhea.  GENITOURINARY: No dysuria. No frequency of urination.  MUSCULOSKELETAL: No joint pain or swelling. No muscular pain or swelling.  INTEGUMENTARY: No skin rash. No ulcers.  NEUROLOGY: No focal weakness. No seizure activity. No headache.  PSYCHIATRY: He has some degree of anxiety, but he is not anxious now. No depression.  ENDOCRINE: No polyuria or polydipsia. No heat or cold intolerance.   PAST MEDICAL HISTORY:  1.  Coronary artery disease status post coronary artery bypass graft in 2009 and stent implant as well.  2.  Systemic hypertension.  3.  Obstructive sleep apnea syndrome, on CPAP.  4.  Gastroesophageal reflux disease. 5.  Scarlet fever.  6.  Obesity.   PAST SURGICAL HISTORY:  1.  Coronary artery bypass graft in 2009, stent implant.  2.  Left eye lens implant.   FAMILY HISTORY: His father died from complications of myocardial infarction and renal failure. His mother died from complications of heart attack. Both of his parents had their coronary artery disease when they were above age of 67. However, his brother died young at the age of 18 from a heart attack.   SOCIAL HABITS: Nonsmoker. No history of alcohol or drug abuse.   SOCIAL HISTORY: He is disabled, unemployed, never married.   ADMISSION MEDICATIONS:  1.  Metoprolol tartrate 25 mg twice a day. 2.  Simvastatin 40 mg once a day. 3.  Ranitidine or Zantac 150  mg twice a day.   ALLERGIES: HE REPORTS NO KNOWN DRUG ALLERGIES; HOWEVER, IN THE PAST HIS RECORD STATES THAT HE HAS ALLERGY TO SULFA CAUSING SKIN RASH. ALSO, PENICILLIN CAUSING SKIN RASH.   PHYSICAL EXAMINATION: VITAL SIGNS: Blood pressure 124/70, respiratory rate 20, pulse 62, temperature 98.1. Oxygen saturation 96%.  GENERAL APPEARANCE: Middle-aged male lying in bed in no acute distress. He looks very comfortable.  HEENT:   Head and neck:  No pallor. No icterus. No cyanosis. Ears:  Revealed normal hearing. No discharge. No ulcers. Nose:  Showed no discharge, no ulcers, no bleeding. Oropharyngeal:  Revealed normal lips and tongue. No oral thrush, no ulcers. Eye:  Revealed normal eyelids.  Minimal ptosis of left upper eyelid.  Pupils about 4 mm on the right with a little smaller on the left side. The left eye pupil is only sluggishly reactive to light.  NECK:  Supple. Trachea at midline. No thyromegaly. No cervical lymphadenopathy.  HEART: Distant heart sounds, barely audible.  Did not hear any murmur. No gallop. No carotid bruits.  LUNGS: Normal breathing pattern without use of accessory muscles. No rales. No wheezing.  ABDOMEN: Obese, soft without tenderness. No hepatosplenomegaly. No masses. No hernias.  SKIN: No ulcers. No subcutaneous nodules.  MUSCULOSKELETAL: No joint swelling. No clubbing.  NEUROLOGIC: Cranial nerves II through XII are intact. No focal motor deficit.  PSYCHIATRIC: The patient is alert and oriented x 3. Mood and affect were normal. In fact he is cheerful and appears to be happy for some reason.   LABORATORY AND DIAGNOSTICS: His EKG showed normal sinus rhythm at rate of 68 per minute. Nonspecific ST-T wave abnormalities. These are diffuse. His chest x-ray showed no acute cardiopulmonary abnormalities. No effusion. No consolidation. His serum glucose 130. His B-type natriuretic peptide was 602, BUN 16, creatinine 0.8, sodium 138, and potassium 4. CPK was 327, however, troponin was normal at 0.02. CBC showed white count of 11,000, hemoglobin 13, hematocrit 41 and platelet count 241. D-dimer less than 0.2.   ASSESSMENT: 1.  Shortness of breath associated with some cough and mucus formation. The episode appears to be self-limited with some chest pressure feeling that was brief. It is unclear whether his symptoms are angina equivalent versus simply had self-limited upper respiratory tract infection or mucus  plugging. The patient is totally asymptomatic right now.  2.  Coronary artery disease status post coronary artery bypass graft and history of stent implant as well.  3.  Systemic hypertension.  4.  Obstructive sleep apnea, on CPAP.  5.  Gastroesophageal reflux disease. 6.  Obesity.  PLAN: We will admit the patient as observation, follow up on cardiac enzymes, continue home medications as listed above. Add aspirin. If the patient remains asymptomatic, he just needs to follow-up with his primary care physician and also with his cardiologist at [facility]. We will monitor over on telemetry and check his cardiac enzymes. For deep vein thrombosis prophylaxis, will place him on Lovenox subcutaneous once a day.   TIME SPENT: In evaluating this patient took more than 55 minutes. ____________________________ Clovis Pu. Lenore Manner, MD amd:sb D: 02/24/2013 06:16:16 ET T: 02/24/2013 08:05:57 ET JOB#: [id number]  cc: Clovis Pu. Lenore Manner, MD, <Dictator> Mike Craze Irven Coe MD ELECTRONICALLY SIGNED 02/25/2013 3:15

## 2014-11-28 NOTE — H&P (Signed)
PATIENT NAME:  Evan [name], Evan [name] MR#:  [id number] DATE OF BIRTH:  February 12, 1956  DATE OF ADMISSION:  12/29/2013  PRIMARY CARE PHYSICIAN: Dr. Lorenda Peck at the [place] [facility].  CARDIOLOGIST: Dr. Ricky Ala at [facility] cardiology.   CHIEF COMPLAINT: "I can't breathe."   HISTORY OF PRESENT ILLNESS: This is a 59 year old man, who states that he cannot breathe. His legs get very weak. He has been coughing up whitish phlegm. He has panic attacks. Last night he was woken up at 1120 with chest pressure that lasted a period of time that was in the center of his chest, not too severe in nature, but then he had some stomach cramps, some sweating, some vomiting and he could not breathe. He states that everything gets caught in his throat and then he gets very panicky and short of breath. In the ER, his initial EKG showed ST elevation 1 mm in lead III, but that was it. Lead II was none at all and aVF was 0.5 mm. Repeat EKG did not show the elevation. The patient does have some flipped Ts laterally. Hospitalist services were contacted for further evaluation.   PAST MEDICAL HISTORY: Coronary artery disease, status post 4 stents and then later on CABG, hypertension, hyperlipidemia and sleep apnea.   PAST SURGICAL HISTORY: CABG and cataracts.   ALLERGIES: SULFA AND PENICILLIN.   MEDICATIONS: Include albuterol CFC 2 puffs 4 times a day as needed, Claritin 10 mg daily, Lasix 40 mg daily, metoprolol 25 mg twice a day, Nexium 40 mg twice a day, Zoloft 50 mg daily, simvastatin 40 mg at bedtime, Zantac 150 mg twice a day. The patient also states that he takes aspirin 81 mg daily.   SOCIAL HISTORY: No smoking. No alcohol. No drug use. He is on disability for his heart.   FAMILY HISTORY: Brother died at 36 of an MI. Mother died at 50 of an MI. Father died at 55 and of  an MI.   REVIEW OF SYSTEMS: CONSTITUTIONAL: Positive for fever low-grade, chills and sweats. Positive for weight loss. Positive for fatigue. EYES: He does wear  glasses. He has a lens on his left eye. He does see stars and some X's and O's on his vision. EARS, NOSE, MOUTH AND THROAT: Positive for vertigo. Positive for postnasal drip. Positive for sore throat. CARDIOVASCULAR: Positive for chest pain. Positive for palpitations. RESPIRATORY: Positive for shortness of breath. Positive for cough. Positive for whitish phlegm with bubbles.  GASTROINTESTINAL: Positive for nausea. Positive for vomiting. Positive for abdominal cramping. Positive for diarrhea 3 times this week. Blood in the bowel movements had stopped.  GENITOURINARY: Positive for burning on urination. MUSCULOSKELETAL: Positive for cramping in the legs. INTEGUMENTARY: No ulcers or redness on his leg. PSYCHIATRIC: Positive for panic attacks. ENDOCRINE: No thyroid problems. HEMATOLOGIC AND LYMPHATIC: No anemia.   PHYSICAL EXAMINATION:  VITAL SIGNS: Temperature 98, pulse 65, respirations 18, blood pressure 149/76, pulse oximetry 97% on room air.  GENERAL: No respiratory distress, lying flat in bed.  EYES: Conjunctivae and lids normal. Pupils equal, round, and reactive to light. Extraocular muscles intact. No nystagmus. EARS, NOSE, MOUTH AND THROAT: Tympanic membranes: No erythema. Nasal mucosa: No erythema. Throat: No erythema. No exudate seen. Lips and gums: No lesions.  NECK: No JVD. No bruits. No lymphadenopathy. No thyromegaly. No thyroid nodules palpated.  RESPIRATORY: Decreased breath sounds bilaterally. No rhonchi, rales or wheeze heard.  CARDIOVASCULAR: S1, S2 soft. No gallops, rubs, or murmurs heard. Carotid upstroke 2+ bilaterally. No bruits.  EXTREMITIES:  Dorsalis pedis pulses one half plus bilaterally. Right lower extremity 3+ edema. Left lower extremity 1+ edema.  ABDOMEN: Soft, nontender. No organomegaly/splenomegaly. Normoactive bowel sounds. No masses felt.  LYMPHATIC: No lymph nodes in the neck.  MUSCULOSKELETAL: Shows 3+ edema of the right lower extremity, 1+ edema left lower extremity. No  clubbing. No cyanosis.  SKIN: Some reddish discoloration of the right lower extremity. No warmth felt.  NEUROLOGIC: Cranial nerves II through XII grossly intact. Deep tendon reflexes are 1+ bilateral lower extremities.  PSYCHIATRIC: The patient is oriented to person, place and time.   LABORATORY AND RADIOLOGICAL DATA: Urinalysis negative. Ultrasound of the right lower extremity, no DVT. Chest x-ray was read as no acute cardiopulmonary disease, but to me its poor inspiratory effort with peripheral vascular congestion. CPK 438. Glucose 103, BUN 16, creatinine 0.94, sodium 138, potassium 3.9, chloride 105, CO2 26, calcium 9.0. Liver function tests normal range. White blood cell count 12.1, hemoglobin and hematocrit 14.7 and 44.9, platelet count of 235,000. Troponin negative. BNP 751. First EKG showed ST elevation in lead III, not seen in II,  aVF, but just slight elevation, but not significant, PVC, flattening of flipped T waves bilaterally. Second EKG showed normal sinus rhythm, low voltage, flipped T waves laterally.   ASSESSMENT AND PLAN:  1.  Shortness of breath and chest pain, possible congestive heart failure. We will give IV Lasix 40 mg IV stat and 40 mg IV daily. Get an echocardiogram. Put on CPAP at night. Get serial cardiac enzymes to rule out myocardial infarction. Monitor on telemetry. I spoke with cardiology, Dr. [name],  to review old records from [facility] cardiology and decide on further testing or not. I will continue aspirin and metoprolol at this point.  2.  Hypertension. Blood pressure borderline. Continue current medications.  3.  Sleep apnea and obesity. BMI 47.8. Weight loss needed. CPAP at night.  4.  Hyperlipidemia on Zocor.  5.  Allergies, continue Flonase and Claritin.   TIME SPENT ON ADMISSION: 55 minutes.   ____________________________ Tana Conch. Leslye Peer, MD rjw:aw D: 12/29/2013 15:00:22 ET T: 12/29/2013 15:16:52 ET JOB#: [id number]  cc: Tana Conch. Leslye Peer, MD,  <Dictator> Lorenda Peck, MD AT [place] MD ELECTRONICALLY SIGNED 01/01/2014 14:23

## 2014-11-28 NOTE — H&P (Signed)
PATIENT NAME:  Evan [name], Evan [name] MR#:  [id number] DATE OF BIRTH:  [birth year]  DATE OF ADMISSION:  03/05/2014  PRIMARY CARE PHYSICIAN: Dr. Placido Sou at [facility].    PRIMARY CARDIOLOGIST:  .   CHIEF COMPLAINT:   Right lower extremity swelling.   A 59 year old male with a history of coronary artery disease, status post six-vessel CABG and 4 stents, hypertension, hyperlipidemia, sleep apnea on CPAP, who presents with right lower extremity edema. For the past several days, the patient has had increasing swelling in the right lower extremity, as well as pain, tenderness and redness. He has apparently had a history of this in the past and has been in the hospital and treated for this.  He presented basically due to main concern of his leg.   REVIEW OF SYSTEMS.  CONSTITUTIONAL:  He had a low-grade temperature of 99.9. Positive fatigue, weakness. No weight loss or gain.   EYES:  No blurred or double vision. EAR, NOSE, THROAT: Positive snoring. Positive allergies. No postnasal drip.  RESPIRATORY: Positive cough.  No wheezing, hemoptysis, dyspnea or painful respirations.  CARDIOVASCULAR:  No chest pain, orthopnea, edema, arrhythmia, dyspnea on exertion, palpitations, syncope. GASTROINTESTINAL: No nausea, vomiting, diarrhea, abdominal pain, melena or ulcers.  GENITOURINARY:   No dysuria or hematuria. ENDOCRINE:  No polyuria, polydipsia.  HEMATOLOGIC AND LYMPHATICS:  No bleeding, swollen glands.  SKIN:  Positive cellulitis in the right lower extremities.  MUSCULOSKELETAL:  No limited activity.  NEUROLOGIC:  No history of CVA, TIA or seizures.  PSYCHIATRIC: +anxiety no depression.   PAST MEDICAL HISTORY:   1.  CAD status post 4 stents and six-vessel CABG.  2.  Hypertension.  3.  Hyperlipidemia.  4.  Sleep apnea on CPAP.  5.  Morbid obesity. 6.  Panic attacks/anxiety. 7.  Diastolic heart failure.  He had an echocardiogram in May 2015 which showed EF of 50% to 55%.  Unable to estimate  RSVP.   PAST SURGICAL HISTORY:   1.  Six-vessel CABG.  2.  Cataract surgery.   ALLERGIES: SULFA AND PENICILLIN.   MEDICATIONS: 1.  Albuterol 2 puffs q.4 hours p.r.n.  2.  Claritin 10 mg daily.  3.  Lasix 40 mg daily. 4.  Metoprolol 25 p.o. b.i.d.  5.  Nexium 40 mg p.o. b.i.d. 6.  Zoloft 50 mg daily.  7.  Simvastatin 40 mg at bedtime.  8.  Zantac 150 mg b.i.d.  9.  Aspirin 81 mg daily.   SOCIAL HISTORY:  No tobacco, alcohol or drug use.   FAMILY HISTORY: Positive for CAD.  His brother and his parents all had MI.    PHYSICAL EXAMINATION: VITAL SIGNS: Temperature 98.2, pulse 70, respirations 20, blood pressure 157/76, 97% on room air.  GENERAL: The patient is alert, oriented, very anxious, not in acute distress. HEENT:  Head is atraumatic. Pupils are round. Sclerae anicteric. Mucous membranes are moist.  Oropharynx is clear. NECK: Supple. Neck is very short; hard to appreciate enlarged thyroid or JVD.  RESPIRATORY: Decreased breath sounds bilaterally. No rhonchi, rales or wheezing are heard. Normal chest expansion.  CARDIOVASCULAR: Regular rate and rhythm. No murmurs, gallops or rubs. PMI is hard to palpate due to body habitus.  EXTREMITIES:  He has got 4+ pitting edema in the right lower extremity. It should be noted in May he had right lower extremity of 3+ and left lower extremity is 1+.  ABDOMEN: Obese, bowel sounds are positive and nontender.  Hard to appreciate organomegaly due to body habitus.  No guarding or rebound. SKIN:  Right lower extremity shows redness, swelling, tenderness.  He has got some chronic dry skin changes and several cracks in the bottom of his feet, but no obvious source of infection.  NEUROLOGIC: Cranial nerves II through XII are intact. No focal deficits.   IMAGING AND LABORATORY DATA: White blood cells 10, hemoglobin 13.9, hematocrit 44. Platelets are 213. Sodium 139, potassium 4.0, chloride 106, bicarb 25, BUN 16, creatinine 1.05. Glucose is 115. Alk  phos 87, ALT 41, AST 41, total protein 7.3, albumin 3.3. Calcium is 8.6.  BNP 341, troponin less than 0.02. Lower extremity Doppler was negative for DVT. Chest x-ray shows no acute cardiopulmonary disease. Urinalysis shows negative LCE or nitrites.   ASSESSMENT AND PLAN: A 59 year old male with chronic right lower extremity edema presents with increasing extremity edema, redness and a low-grade fever, likely right lower extremity cellulitis.  1.  Right lower extremity cellulitis. The patient has been started on vancomycin, which we will continue as he is ALLERGIC TO PENICILLIN AND SULFA, which limits our use of other antibiotics at this time. His Doppler was negative for DVT.  I have asked the patient to elevate the leg and we will monitor closely.  2.  Chronic diastolic heart failure.  I do not think in this case he is in congestive heart failure. We will continue Lasix and metoprolol. His cardiologist is  Dr. [name] . This last echo was May.  3.  Sleep apnea. Continue with CPAP machine.  4.  History of coronary artery disease with six-vessel coronary artery bypass graft and stents. We will continue Zocor, aspirin, metoprolol.   The patient is a FULL CODE STATUS.    TIME SPENT: Approximately 45 minutes.     ____________________________ Donell Beers. Benjie Karvonen, MD spm:dmm D: 03/05/2014 11:12:00 ET T: 03/05/2014 11:32:57 ET JOB#: [id number]  cc: Placido Sou, MD Minna Merritts, MD Bliss Behnke P. Benjie Karvonen, MD, <Dictator> Donell Beers Teddie Mehta MD ELECTRONICALLY SIGNED 03/05/2014 15:26

## 2014-11-28 NOTE — Discharge Summary (Signed)
PATIENT NAME:  Evan [name], Evan [name] MR#:  [id number] DATE OF BIRTH:  06-Aug-1956  DATE OF ADMISSION:  04/28/2014 DATE OF DISCHARGE:  05/01/2014  For a detailed note, please see the history and history and physical done on admission by Dr. Dustin Flock.    DIAGNOSES AT DISCHARGE: As follows:  1.  Acute right lower extremity cellulitis.  2.  History of congestive heart failure.  3.  Hypertension.  4.  Hyperlipidemia.  5.  Obstructive sleep apnea.  6.  Gastroesophageal reflux disease.   DIET: The patient is being discharged on a low-sodium, low-fat diet.   ACTIVITY: As tolerated.   FOLLOWUP: [facility] in the next 1 to 2 weeks.   DISCHARGE MEDICATIONS: Metoprolol tartrate 25 mg b.i.d., simvastatin 40 mg at bedtime, Claritin 10 mg daily, Flonase 1 spray to each nostril daily, Nexium 40 mg daily, albuterol inhaler 2 puffs 4 times daily as needed, aspirin 81 mg daily, Lasix 40 mg b.i.d. as needed, Zantac 150 mg b.i.d., hydroxyzine 25 mg 1 to 2 tabs at bedtime as needed, potassium 10 mEq daily, clindamycin 300 mg t.i.d. x 7 days.   [place] COURSE: Dr. Hessie Knows from orthopedics.   PERTINENT STUDIES DONE DURING THE HOSPITAL COURSE: An ultrasound of the right lower extremity showing no evidence of an acute DVT. A CT scan of the right lower extremity done with contrast showing no evidence of osteomyelitis, myositis or abscess. Diffuse subcutaneous edema in the calf nonspecific for cellulitis versus lymphatic or venous drainage, mild patellar spurring.    HOSPITAL COURSE: This is a 59 year old male who presented to the hospital on 04/28/2014 due to right lower extremity redness, swelling, and an ulcer.  1.  Right lower extremity cellulitis. This was likely the cause of the patient's redness, swelling, and pain. The patient started on broad-spectrum IV antibiotics with vancomycin, meropenem, and Levaquin. Ultrasound of the lower extremities was also obtained, which showed no  evidence of an acute DVT. The patient also was seen by orthopedics as there was some concern for possible underlying compartment syndrome, although the CT of the right lower extremity showed no evidence of that. The patient was also seen by orthopedics who did not think that patient had a compartment syndrome. After getting broad-spectrum IV antibiotics, the patient's clinical symptoms have significantly improved. He is afebrile. His white cell count is normal. His redness, swelling, and pain have significantly improved. Therefore, he is being discharged home on oral clindamycin for the next 7 days. The patient's blood cultures have remained negative.  2.  Obstructive sleep apnea. The patient was maintained on her CPAP, he will resume that.  3.  History of coronary artery disease. The patient had no evidence of acute chest pain. He will continue his aspirin, metoprolol, and statin.  4.  Hypertension. The patient's blood pressure remained stable on his metoprolol and he will resume that.  5.  GERD. The patient was maintained on his Protonix. He will resume his Nexium upon discharge.   CODE STATUS: The patient is a full code.   DISPOSITION: He is being discharged home.   TIME SPENT: 35 minutes.    ____________________________ Belia Heman. Verdell Carmine, MD vjs:at D: 05/01/2014 14:09:04 ET T: 05/01/2014 15:31:17 ET JOB#: [id number]  cc: Belia Heman. Verdell Carmine, MD, <Dictator> Roosevelt MD ELECTRONICALLY SIGNED 05/04/2014 15:08

## 2014-11-28 NOTE — H&P (Signed)
PATIENT NAME:  Evan [name], Evan [name] MR#:  [id number] DATE OF BIRTH:  02-Jan-1956  DATE OF ADMISSION:  04/28/2014  PRIMARY CARE PROVIDER: Placido Sou, MD at [facility].   Emergency Department REFERRING PHYSICIAN: Lisa Roca, MD    CHIEF COMPLAINT: Right lower extremity erythema, ulceration.   HISTORY OF PRESENT ILLNESS: The patient is a 59 year old white male with a history of coronary artery disease, status post 6-vessel CABG and 4 stents, hypertension, hyperlipidemia, sleep apnea on CPAP, history of right lower extremity cellulitis in the past, who presents with right lower extremity swelling and erythema from his knee all the way down to his foot. Along the shin aspect, he also has ulceration with some yellowish drainage. The patient reports that this started about 8 days ago, but he did not seek medical attention. He has had fevers and chills. Otherwise, he denies any calf pain. Denies any nausea, vomiting, or diarrhea. Has intermittent shortness of breath and wheezing.   PAST MEDICAL HISTORY: 1.  Coronary artery disease, status post 4 stents, 6-vessel CABG.  2.  Hypertension.  3.  Hyperlipidemia.  4.  Sleep apnea on CPAP.  5.  Morbid obesity. 6.  History of panic attacks and anxiety.  7.  History of diastolic heart failure. 8.  History of right lower extremity cellulitis.   PAST SURGICAL HISTORY:  1.  Status post 6-vessel CABG.  2.  Cataract surgery.   ALLERGIES: TO SULFA AND PENICILLIN.   CURRENT MEDICATIONS: She is on Zantac 150 one tab p.o. b.i.d., simvastatin 40 at bedtime, potassium chloride 10 mEq daily, Nexium 40 one tab p.o. daily, metoprolol 25 one tab p.o. b.i.d., hydroxyzine 25 one to 2 tabs at bedtime as needed, Lasix 40 one tab p.o. b.i.d., Flonase 1 spray to each nostril daily, Claritin 10 daily, aspirin 81 one tab p.o. daily, albuterol 2 puffs q. 4 hours  p.r.n.   SOCIAL HISTORY: No tobacco, alcohol, or drug use.   FAMILY HISTORY: Positive for coronary artery disease.  Brother and his parents all had MI.   REVIEW OF SYSTEMS:  CONSTITUTIONAL: Complains of fever. No weight loss. No weight gain.  EYES: No blurred or double vision. No redness. No inflammation. No glaucoma. No cataracts.  ENT: No tinnitus. No ear pain. No hearing loss. No seasonal or year-round allergies. No epistaxis. No difficulty swallowing.  RESPIRATORY: Denies any cough. Complains of intermittent wheezing, also dyspnea. No COPD.  CARDIOVASCULAR: Denies any chest pain or orthopnea. Complains of no arrhythmia. Has history of diastolic CHF.   GASTROINTESTINAL: No nausea, vomiting, no abdominal pain. No hematemesis. No melena. No IBS. No jaundice.  GENITOURINARY: Denies any dysuria, hematuria, renal colic, or frequency.  ENDOCRINE: Denies any polyuria, nocturia, or thyroid problems.  HEMATOLOGIC AND LYMPHATICS: Denies anemia, easy bruisability, or bleeding.  SKIN: Complains of erythema involving his right leg.  MUSCULOSKELETAL: Pain in the right leg.  NEUROLOGIC: No numbness, CVA, TIA, or seizures.  PSYCHIATRIC: History of panic attacks.   PHYSICAL EXAMINATION:  VITAL SIGNS: Temperature 98.4, pulse 76, respirations 20, blood pressure 151/72, O2 of 96%.  GENERAL: The patient is an obese male, currently not in any acute distress.  HEENT: Head atraumatic, normocephalic. Pupils equally round and reactive to light and accommodation. Extraocular movements intact. There is no conjunctival pallor. No scleral icterus. Nasal exam shows no drainage or ulceration. Oropharynx is clear without any exudate. NECK:  Supple without any JVD. No thyromegaly. No carotid bruits.  CARDIOVASCULAR: Regular rate and rhythm. No murmurs, rubs, clicks, or gallops.  LUNGS: Clear to auscultation bilaterally without any rales, rhonchi, wheezing. No accessory muscle usage.  ABDOMEN: Soft, nontender, nondistended. Positive bowel sounds x4.  EXTREMITIES: He has got right leg erythema extending from his knee down all the way to  his foot. He has some ulceration involving the shin with some yellowish drainage. There is warmth to palpation.  SKIN: Erythema as above. No other rashes.  LYMPH NODES: Nonpalpable.  VASCULAR: Good DP, PT pulses.  PSYCHIATRIC: Not anxious or depressed.  NEUROLOGIC: Awake, alert, oriented x3. No focal deficits.   IMAGING: Ultrasound of the lower extremities showed no evidence of DVT in the right lower extremity.   LABORATORY DATA:  WBC of 10.8, hemoglobin 13.8, platelet count 276,000. Glucose 178, BUN 15, creatinine 1.03, sodium 140, potassium 3.9, chloride 108, CO2 of 26, calcium 8.4.   ASSESSMENT AND PLAN: The patient is a 59 year old white male with history of diastolic congestive heart failure, diabetes, sleep apnea, coronary artery disease, who presents with right lower extremity swelling, erythema, and ulceration.  1.  Right lower extremity cellulitis with superficial ulceration. At this time, due to his PENICILLIN ALLERGY, we will treat him with IV Levaquin and vancomycin. If fails to improve, consider infectious disease input.  2.  History of congestive heart failure, currently compensated. Continue Lasix as taking at home. 3.  Sleep apnea. We will continue CPAP at bedtime.  4.  Hyperlipidemia. Continue simvastatin.  5.  Gastroesophageal reflux disease. We will continue his proton pump inhibitors.  6. Miscellaneous. The patient will be on Lovenox for deep vein thrombosis prophylaxis.   TIME SPENT: Fifty minutes on this patient.     ____________________________ Lafonda Mosses. Posey Pronto, MD shp:lr D: 04/28/2014 17:53:52 ET T: 04/28/2014 18:35:33 ET JOB#: [id number]  cc: Abbie Berling H. Posey Pronto, MD, <Dictator> Alric Seton MD ELECTRONICALLY SIGNED 05/08/2014 13:57

## 2014-11-28 NOTE — Consult Note (Signed)
General Aspect Primary Cardiologist: Dr. [name], MD  59 y/o M with h/o CAD and MI 1990's s/p 6 vessel CABG 03/2008, stenting x 4 dating back to early 5701, chronic diastolic CHF, OSA on CPAP, HTN, HL, recurrent intermittent lower extremity edema/cellulitis (R>L) who presented to [facility] on 03/05/2014 who complaint of right RLE swelling, erythema, tenderness, pain for the past several days.  ________________________________   Present Illness 59 y/o M with the above problem list who presented to [facility] on 03/05/2014 with a several day history of RLE swelling, pain, tenderness, and erythema.   Patient with significant cardiac history dating back into the 1990's when he states he had his first MI. He reports his symptoms as left sided chest pain that radiated to his neck, shoulder, and arm. He went on to have multiple stents (4) placed in the early 2000's. After that time he tells me he just did not feel well for quite a while. Noted he was tired all of the time and had a cough productive of green sputum. At times he would "fall out." He was not having any chest pain. He ultimately had a cath which revealed the need for 6 vessel bypass. Surgical anatomy is as follows: LIMA to the LAD, vein graft to the diagonal, vein graft to the OM, vein graft to the ramus, vein graft to OM 2 and vein graft to the distal RCA. In 2009 was his last cath.   He reports over the past 4-5 months he has had issues with intermittent lower extremity edema/cellulitis (R>). Last admitted for this in 09/2013 along with BRBPR. He has had negative LE doppler to r/o DVT in this extremity on 03/05/2014, 12/29/2013, and 10/02/2013. Left leg with minimal swelling at times. He reports going for several walks around his neighborhood when the swelling began. At times when he goes for these walks he will develop right sided chest pain that lasts for "65 seconds." Pain self resolves without medication. No associated symptoms. He also notes an increase in his  cough lately. Cough is productive of green sputum and is keeping him awake at nighttime.   He was scheduled for Lexiscan Myoview at the [facility] office back in 09/2013 but he cancelled this appointment and has not rescheduled.  He is uncertain what his dry weight is. Current weight 291. In clinic on 01/09/2014 weight was 283.  He was -379 I/O yesterday. Currently +693 today I/O.   Physical Exam:  GEN well developed, well nourished, no acute distress, pleasant   HEENT PERRL   NECK supple   RESP normal resp effort  clear BS   CARD Regular rate and rhythm  No murmur   ABD denies tenderness  distended  normal BS   EXTR 2+ pitting edema RLE to mid shin with assocaited erythema and TTP   SKIN see above   NEURO cranial nerves intact   PSYCH alert, A+O to time, place, person, good insight   Review of Systems:  General: Weight loss or gain  Fatigue  Fever/chills  weight has been up and down   Skin: No Complaints   ENT: post nasal drip   Eyes: No Complaints   Neck: No Complaints   Respiratory: Frequent cough   Gastrointestinal: Heartburn   Vascular: Calf pain with walking  RLE   Musculoskeletal: No Complaints   Neurologic: No Complaints   Hematologic: No Complaints   Endocrine: No Complaints   Psychiatric: No Complaints   Review of Systems: All other systems were  reviewed and found to be negative   Medications/Allergies Reviewed Medications/Allergies reviewed   Family & Social History:  Family and Social History:  Family History Coronary Artery Disease  mother with MI, HTN, and HL. brother with MI.   Social History negative tobacco, negative ETOH, negative Illicit drugs     Chronic Diastolic CHF: a. 11/84 Echo: poor acoustic windows, EF 76-19%, nl RV systolic fxn.   Rectal Bleeding: a. 10/2013 EGD: nl;  b. 10/2013 Colonoscopy: 1 sessile transverse colon polyp.   Cataracts:    Morbid Obesity:    CAD: a. s/p MI in the 30's @ [facility] with PCI of the LAD and  RCA;  b. 09/2000 BMS to the RCA;  c. 07/2001 PCI of OM1 (CBA), BMS RCA;  d. 03/2008 CABGx6: LIMA->LAD, VG->D1, VG->RI->OM1->OM2, VG->RCA.   chf:    OSA - uses CPAP:    GERD - Esophageal Reflux:    Hypercholesterolemia:    cardiac disease:    hypertension:    cateracts: left eye   cardiac bypass: x7   Stent - Cardiac:   Home Medications: Medication Instructions Status  hydrOXYzine hydrochloride 25 mg oral tablet 1 tab(s) orally every 4 to 6 hours, As Needed - for Anxiety, Nervousness - for Inability to Sleep  Active  albuterol CFC free 90 mcg/inh inhalation aerosol 2 puff(s) inhaled 4 times a day, As Needed Active  NexIUM 40 mg oral delayed release capsule 1 cap(s) orally once a day Active  metoprolol 25 mg oral tablet 1 tab(s) orally 2 times a day Active  Claritin 10 mg oral tablet 1 tab(s) orally once a day Active  Zantac 150 150 mg oral tablet 1 tab(s) orally 2 times a day Active  furosemide 40 mg oral tablet 1 tab(s) orally once a day Active  simvastatin 40 mg oral tablet 1 tab(s) orally once a day (at bedtime) Active  metoprolol 25 mg oral tablet 1 tab(s) orally 2 times a day Active  Flonase 50 mcg/inh nasal spray 1 spray(s) nasal once a day Active  Flonase 50 mcg/inh nasal spray 1 spray(s) nasal once a day Active   Lab Results:  Routine Micro:  30-Jul-15 05:30   Micro Text Report BLOOD CULTURE   COMMENT                   NO GROWTH IN 18-24 HOURS   ANTIBIOTIC                       Culture Comment NO GROWTH IN 18-24 HOURS  Result(s) reported on 06 Mar 2014 at 05:00AM.    05:35   Micro Text Report BLOOD CULTURE   COMMENT                   NO GROWTH IN 18-24 HOURS   ANTIBIOTIC                       Culture Comment NO GROWTH IN 18-24 HOURS  Result(s) reported on 06 Mar 2014 at 05:00AM.  Routine Chem:  31-Jul-15 04:54   Glucose, Serum  143  BUN 13  Creatinine (comp) 1.08  Sodium, Serum 136  Potassium, Serum 4.0  Chloride, Serum 104  CO2, Serum 26  Calcium  (Total), Serum 9.0  Anion Gap  6  Osmolality (calc) 275  eGFR (African American) >60  eGFR (Non-African American) >60 (eGFR values <2m/min/1.73 m2 may be an indication of chronic kidney disease (CKD). Calculated eGFR is  useful in patients with stable renal function. The eGFR calculation will not be reliable in acutely ill patients when serum creatinine is changing rapidly. It is not useful in  patients on dialysis. The eGFR calculation may not be applicable to patients at the low and high extremes of body sizes, pregnant women, and vegetarians.)  Cardiac:  30-Jul-15 05:30   Troponin I < 0.02 (0.00-0.05 0.05 ng/mL or less: NEGATIVE  Repeat testing in 3-6 hrs  if clinically indicated. >0.05 ng/mL: POTENTIAL  MYOCARDIAL INJURY. Repeat  testing in 3-6 hrs if  clinically indicated. NOTE: An increase or decrease  of 30% or more on serial  testing suggests a  clinically important change)  Routine Hem:  31-Jul-15 04:54   WBC (CBC)  11.6  RBC (CBC) 4.97  Hemoglobin (CBC) 14.4  Hematocrit (CBC) 43.8  Platelet Count (CBC) 247  MCV 88  MCH 29.1  MCHC 32.9  RDW  15.3  Neutrophil % 63.4  Lymphocyte % 24.1  Monocyte % 8.1  Eosinophil % 3.7  Basophil % 0.7  Neutrophil #  7.3  Lymphocyte # 2.8  Monocyte # 0.9  Eosinophil # 0.4  Basophil # 0.1 (Result(s) reported on 06 Mar 2014 at 06:01AM.)   EKG:  Interpretation EKG to be ordered.   Radiology Results: XRay:    30-Jul-15 05:49, Chest PA and Lateral  Chest PA and Lateral   REASON FOR EXAM:    short of breath  COMMENTS:       PROCEDURE: DXR - DXR CHEST PA (OR AP) AND LATERAL  - Mar 05 2014  5:49AM     CLINICAL DATA:  Shortness of breath and cough for 2 weeks. Dizziness  and weakness.    EXAM:  CHEST  2 VIEW    COMPARISON:  02/02/2014    FINDINGS:  Postoperative changes in the mediastinum. The heart size and  mediastinal contours are within normal limits. Both lungs are clear.  The visualized skeletal structures  are unremarkable.     IMPRESSION:  No active cardiopulmonary disease.      Electronically Signed    By: Lucienne Capers M.D.    On: 03/05/2014 05:58         Verified By: Neale Burly, M.D.,  [place]:    30-Jul-15 06:30, [place] Color Flow Doppler Lower Extrem Right (Leg)  [place] Color Flow Doppler Lower Extrem Right (Leg)   REASON FOR EXAM:    R leg swelling  COMMENTS:       PROCEDURE: [place]  - US DOPPLER LOW EXTR RIGHT  - Mar 05 2014  6:30AM     CLINICAL DATA:  Right leg swelling. Pain and redness. Edema.  Varicose veins. Anticoagulation therapy.    EXAM:  Right LOWER EXTREMITY VENOUS DOPPLER ULTRASOUND    TECHNIQUE:  Gray-scale sonography with graded compression, as well as color  Doppler and duplex ultrasound were performed to evaluate the lower  extremity deep venous systems from the level of the common femoral  vein and including the common femoral, femoral, profunda femoral,  popliteal and calf veins including the posterior tibial, peroneal  and gastrocnemius veins when visible. The superficial great  saphenous vein was also interrogated. Spectral Doppler wasutilized  to evaluate flow at rest and with distal augmentation maneuvers in  the common femoral, femoral and popliteal veins.    COMPARISON:  12/29/2013    FINDINGS:  Common Femoral Vein: No evidence of thrombus. Normal  compressibility, respiratoryphasicity and response to augmentation.    Saphenofemoral Junction:  No evidence of thrombus. Normal  compressibility and flow on color Doppler imaging.  Profunda Femoral Vein: No evidence of thrombus. Normal  compressibility and flow on color Dopplerimaging.    Femoral Vein: No evidence of thrombus. Normal compressibility,  respiratory phasicity and response to augmentation.    Popliteal Vein: No evidence of thrombus. Normal compressibility,  respiratory phasicity and response to augmentation.    Calf Veins: No evidence of thrombus. Normal compressibility and  flow  on color Doppler imaging.    Superficial Great Saphenous Vein: No evidence of thrombus. Normal  compressibility and flow on color Doppler imaging.  Venous Reflux:  None.    Other Findings:  None.     IMPRESSION:  No evidence of deep venous thrombosis.      Electronically Signed    By: Lucienne Capers M.D.    On: 03/05/2014 06:33         Verified By: Neale Burly, M.D.,    PCN: Hives  Sulfa drugs: Hives  Zoloft: Unknown  Vital Signs/Nurse's Notes: **Vital Signs.:   31-Jul-15 08:13  Vital Signs Type Q 8hr  Temperature Temperature (F) 97.9  Celsius 36.6  Temperature Source oral  Pulse Pulse 68  Respirations Respirations 20  Systolic BP Systolic BP 841  Diastolic BP (mmHg) Diastolic BP (mmHg) 70  Mean BP 95  Pulse Ox % Pulse Ox % 96  Pulse Ox Activity Level  At rest  Oxygen Delivery Room Air/ 21 %    Impression 59 y/o M with h/o CAD and MI 1990's s/p 6 vessel CABG 03/2008, stenting x 4 dating back to early 6606, chronic diastolic CHF, OSA on CPAP, HTN, HL, recurrent intermittent lower extremity edema/cellulitis (R>L) who presented to [facility] on 03/05/2014 who complaint of right RLE swelling, erythema, tenderness, pain for the past several days.  1) Chronic diastolic CHF: Last echo 10/05/6008 with EF 50-55%, unable to exclude regional WMA, normal RV size and systolic function, unable to estimate RVSP. Home dose of lasix 40 mg and lopressor 25 mg bid. BNP 341. Continue these home doses, as he does not appear to be in acute CHF.    2) CAD: Status post 6 vessel CABG 2009 and 4 stents in early 2000's. His last ischemic evaluation was in 2009. He put off his Centerview study back in February 2015. He does have some exertional symptoms in his history, as well as some components seem similar to his prior symptoms, although they are fairly nonspecific. Needs Lexiscan done. This will likely need to be set up first part of next week as an outpatient, unless he is still in the  hospital receiving treatment for his leg. Troponin <0.02. I have ordered an EKG for consult. On statin, aspirin, and bb.    3)  Right lower extremity cellulitis: This has been an intermittent problem for him over the past several months. Currently on vancomycin. LE doppler negative for DVT. Patient is sitting in room with leg on floor upon walking in room. Again, asked patient to elevate leg.  4) OSA: On CPAP   Electronic Signatures for Addendum Section:  Kathlyn Sacramento (MD) (Signed Addendum 02-Aug-15 12:01)  the patient was seen and examined. I agree with the above. He presented with unilateral right lower extremity swelling all the way up to the thigh level. There is no evidence of significant fluid overload by physical exam. BNP was only mildly elevated. The right leg is swollen with minimal warmth. He is being treated for cellulitis. However, unilateral pelvic  venous obstruction might need to be excluded if not already done.   Electronic Signatures: Kathlyn Sacramento (MD)  (Signed 02-Aug-15 12:01)  Co-Signer: General Aspect/Present Illness, Home Medications, Allergies Rise Mu (PA-C)  (Signed 31-Jul-15 12:16)  Authored: General Aspect/Present Illness, History and Physical Exam, Review of System, Family & Social History, Past Medical History, Home Medications, Labs, EKG , Radiology, Allergies, Vital Signs/Nurse's Notes, Impression/Plan   Last Updated: 02-Aug-15 12:01 by Kathlyn Sacramento (MD)

## 2014-11-28 NOTE — Discharge Summary (Signed)
PATIENT NAME:  Evan [name], Evan [name] MR#:  [id number] DATE OF BIRTH:  24-Jun-1956  DATE OF ADMISSION:  12/29/2013 DATE OF DISCHARGE:  12/30/2013   DISPOSITION: Home.   FOLLOWUP: With [facility] for stress test outpatient. With PCP in the next 1 to 2 weeks as well.   MEDICATIONS AT DISCHARGE: Metoprolol 25 mg twice daily, furosemide 40 mg daily, simvastatin 40 mg daily, Nexium 40 mg twice daily, Claritin 10 mg daily, albuterol inhaler 2 puffs 4 times a day as needed, Zantac 150 mg twice daily; sertraline 50 mg (increase the dose to 2 tablets once a day), Apresoline 0.25 mg 1 p.o. q.8 hours as needed for anxiety (10-day supply).  REFERRALS: Needs referral for psychiatry. Needs referral for [facility] stress test.  ADMISSION DIAGNOSIS: Chest pain.   DISCHARGE DIAGNOSIS: Atypical chest pain secondary to anxiety attack.   OTHER DIAGNOSES: 1.  Hypertension.  2.  Gastroesophageal reflux disease.   3.  Anxiety.  4.  Obsessive-compulsive disorder.   HOSPITAL COURSE: This is a 59 year old gentleman with history of coronary artery disease, prior MI in the 1990s, which at that point required multiple PCIs. The patient underwent a coronary artery bypass graft with 6 vessels in 2009. He is morbidly obese. He has hypertension. He has sleep apnea, hyperlipidemia. He sleeps on CPAP. He has panic attacks and anxiety.   The patient has seen cardiology back in February at [facility], Dr. Meda Coffee, and he has been told that he needs a Lexiscan. At that moment, it was not completed because he was concerned of right lower extremity swelling. The patient needs to set up this appointment now, and he stated he will.   The patient came up this time with an episode that was apparently an anxiety attack/panic attack. The pain in the chest resolved, but the patient was mostly really anxious, being dyspneic. The patient had an EKG that showed lateral T wave inversions, which is an old finding. His chest x-ray  did not show any significant abnormalities, but his BNP was mildly elevated. He did not seem to be fluid overloaded.   The patient has been evaluated by cardiology. Cardiology, after seeing normal cardiac enzymes x 3, recommended to follow up outpatient. The patient is going to be discharged. I recommended to increase his Zoloft to 2 pills instead of 1. Prescription for a short amount of Xanax but again, long education was given to the patient to the fact that the anxiety could be the tip of the iceberg of more complicated psychiatric problems. He needs to see a psychiatrist and get medications addressed. Panic attacks will be better treated with SSRIs rather than anxiolytics. The patient understands this information. He is going to be discharged in good condition.   TIME SPENT: I spent about 45 minutes discharging this patient.   ____________________________ [place] Sink, MD rsg:jcm D: 12/30/2013 12:17:03 ET T: 12/30/2013 13:48:13 ET JOB#: [id number]  cc: [place], MD, <Dictator> Dr. Meda Coffee, [facility] Placido Sou, MD Pacey Willadsen America Brown MD ELECTRONICALLY SIGNED 01/10/2014 12:54

## 2014-11-28 NOTE — Discharge Summary (Signed)
PATIENT NAME:  Evan [name], Evan [name] MR#:  [id number] DATE OF BIRTH:  11-04-1955  DATE OF ADMISSION:  03/05/2014 DATE OF DISCHARGE:  03/07/2014  PRIMARY CARE PHYSICIAN:  Dr. Placido Sou at [facility].  PRIMARY CARDIOLOGIST: [place].  CHIEF COMPLAINT: Right lower extremity swelling.   ADMITTING DIAGNOSES: Right lower extremity cellulitis.  PRIMARY DISCHARGE DIAGNOSIS: Right lower extremity cellulitis.  SECONDARY DISCHARGE DIAGNOSES:  1.  Chronic diastolic heart failure. 2.  Obstructive sleep apnea.  3.  History of coronary artery disease.   CONSULTATIONS:  [facility].  PROCEDURES:  None.   BRIEF HISTORY AND PHYSICAL AND HOSPITAL COURSE: Patient is a 59 year old who came into the ED with a chief complaint of right lower extremity swelling on July 30. This was associated with pain, tenderness, and redness.   1.  The patient was diagnosed with a right lower extremity cellulitis.  Lower extremity venous Dopplers were done and deep vein was ruled out.   AS THE PATIENT IS ALLERGIC TO PENICILLIN AND SULFA,   He was started on IV vancomycin.   The patient was recommended to elevate his leg. Eventually, his erythema and pain were significantly improved. The patient started feeling better. He was afebrile for the past 24 hours. The patient started feeling better, and blood cultures turned out to be negative.   Decision was made to discharge the patient home with the p.o. clindamycin,   AS THE PATIENT IS ALLERGIC TO PENICILLIN AND SULFA WHICH GIVES HIM HIVES.   Patient is also started on Florastor.   2.  Chronic diastolic heart failure. The patient is not currently fluid overloaded.  Plan is to continue his Lasix and metoprolol, and to follow up with Dr. [name] as scheduled.   3.  Obstructive sleep apnea. Continue CPAP at bedtime. The patient is not on home O2 during daytime.   4.  History of coronary artery disease with a 6-vessel coronary artery bypass grafting in the past. The  plan is to continue aspirin, Zocor, and metoprolol.  Patient was evaluated by cardiology group, who has recommended to follow-up with them in 5-7 days for outpatient stress test.  The patient was seen by Dr. Fletcher Anon. Patient was recommended to follow up with them as an outpatient in 1 week for outpatient Lexiscan test as the patient was complaining of shortness of breath intermittently.   DISCHARGE MEDICATIONS: furosemide 40 mg once daily, simvastatin 40 mg once daily at bedtime, Claritin 10 mg p.o. once daily, albuterol 2 puffs inhalation 4 times a day as needed, Zantac 150 mg p.o. b.i.d., Flonase 150 mcg inhalation one spray nasally once daily, hydroxyzine 1 tablet p.o. every 4-6 hours as needed for anxiety, Nexium 40 mg p.o. once daily, aspirin 81 mg once daily, clindamycin 300 mg 2 capsules p.o. b.i.d. for 7 days with meals, Florastor 250 mg 1 capsule p.o. b.i.d. for 7 days, Tylenol 325 mg 2 tablets p.o. 4 hours as needed for pain.   Continue CPAP at bedtime.  Follow up with CHF clinic as an outpatient.   DIET:  Low fat, low cholesterol.   ACTIVITY: As tolerated. Monitor daily weights.   FOLLOW UP APPOINTMENTS: Dr. Placido Sou in 1 week and [facility] cardiology in a week for outpatient Lexiscan stress test.   LABORATORY AND IMAGING STUDIES: On July 31, his BMP is normal, except anion gap at 6. WBC 11.6, hemoglobin, hematocrit, and platelets are normal. Blood cultures have revealed no growth.  Lower extremity venous Doppler is negative for DVT.   Plan  of care was discussed with the patient. He is agreeable with the plan.   TOTAL TIME SPENT ON THE DISCHARGE: 45 minutes.    ____________________________ Nicholes Mango, MD ag:ts D: 03/07/2014 16:26:02 ET T: 03/07/2014 17:23:37 ET JOB#: [id number]  cc: Nicholes Mango, MD, <Dictator> Placido Sou, MD Nicholes Mango MD ELECTRONICALLY SIGNED 03/12/2014 12:08

## 2014-11-28 NOTE — Consult Note (Signed)
Brief Consult Note: Diagnosis: Lower GI bleed.  Known history of GERD.  Odynophagia.  OSA.  CAD.   Consult note dictated.   Discussed with Attending MD.   Comments: Patient's presentation discussed with Dr. Verdie Shire.  At this time recommendation is to continue to follow hemoglobin today.  Hemoglobin has remained stable during hospitalization.  If hemoglobin remains stable today then recommend patient being discharged with the goal of him proceeding with endoscopy evaluation on outpatient basis.  Again recommendation would be EGD as well as diagnostic colonoscopy.  Will continue to follow.  Electronic Signatures: Payton Emerald (NP)  (Signed 27-Feb-15 09:56)  Authored: Brief Consult Note   Last Updated: 27-Feb-15 09:56 by Payton Emerald (NP)

## 2014-11-28 NOTE — H&P (Signed)
PATIENT NAME:  Evan [name], Evan [name] MR#:  [id number] DATE OF BIRTH:  [birth year]  DATE OF ADMISSION:  10/02/2013  REFERRING PHYSICIAN:  Dr. Cinda Quest.  PRIMARY CARE PHYSICIAN:  [facility].   CHIEF COMPLAINT:  Bright red blood per rectum.   HISTORY OF PRESENT ILLNESS:  A 59 year old Caucasian gentleman with history of obstructive sleep apnea on CPAP therapy, coronary artery disease with history of CABG, hypertension, gastroesophageal reflux disease, presenting with bright red blood per rectum.  Apparently he describes one month duration of bright red blood per rectum; however, there has only been three episodes in that month.  Today he noted a large episode of bright red blood per rectum.  He states that it is usually mixed blood with stool, however today pure blood whenever he had an episode.  He noticed feeling lightheaded.  Denies any syncope, palpitations, chest pain or shortness of breath.  He has a variety of other complaints including right lower extremity redness.  He describes this for almost three months duration, though greatly worsened over the last one week.  Denies any fevers, chills, lower extremity pain.  He also developed a cough present for 1 to 2 week duration, nonproductive, without associated shortness of breath.  Currently, he is without complaints.   REVIEW OF SYSTEMS:  CONSTITUTIONAL:  Denies fevers, chills, fatigue, weakness.  EYES:  Denies blurred vision, double vision, eye pain.  EARS, NOSE, THROAT:  Denies tinnitus, ear pain, hearing loss.  RESPIRATORY:  Positive for cough as described above; however, denies any wheezing, hemoptysis, dyspnea.  CARDIOVASCULAR:  Denies chest pain, palpitations, edema.  GASTROINTESTINAL:  Denies nausea, vomiting, diarrhea.  Positive for bright red blood per rectum with associated abdominal cramping.  GENITOURINARY:  Denies dysuria or hematuria.  ENDOCRINE:  Denies nocturia or thyroid problems.  HEMATOLOGY AND LYMPHATIC:  Denies easy bruising.   Positive for bleeding as described above.  SKIN:  Denies rashes.  Positive for right lower extremity erythema as described above.  MUSCULOSKELETAL:  Denies pain in neck, back, shoulder, knees, hips or arthritic symptoms.  NEUROLOGIC:  Denies paralysis, paresthesia.  PSYCHIATRIC:  Denies anxiety or depressive symptoms.  Otherwise, full review of systems performed by me is negative.   PAST MEDICAL HISTORY:  Obstructive sleep apnea on CPAP therapy, obesity, gastroesophageal reflux disease, hypertension, hyperlipidemia, coronary artery disease status post CABG.   SOCIAL HISTORY:  Denies alcohol, tobacco or drug usage.   FAMILY HISTORY:  Positive for coronary artery disease.   ALLERGIES:  PENICILLIN AND SULFA DRUGS.   HOME MEDICATIONS:  From the medication list on record, he does not have his medications currently; however, sertraline 25 mg by mouth daily, simvastatin 40 mg by mouth daily, metoprolol 25 mg by mouth twice daily, Lasix 40 mg by mouth daily, ranitidine 150 mg by mouth twice daily, potassium 10 mEq by mouth daily.   PHYSICAL EXAMINATION: VITAL SIGNS:  Temperature 97.8, heart rate 85, respirations 20, blood pressure 147/70, saturating 96% on room air.  Weight 128.4 kg, BMI of 47.1.  GENERAL:  Well-nourished, well-developed, Caucasian gentleman, currently in no acute distress.  HEAD:  Normocephalic, atraumatic.  EYES:  Pupils equal, round, reactive to light.  Extraocular muscles intact.  No scleral icterus.  MOUTH:  Moist mucosal membranes.  Dentition intact.  No abscess noted.  EAR, NOSE, THROAT:  Throat clear without exudates.  No external lesions.  NECK:  Supple.  No thyromegaly.  No nodules.  No JVD.  PULMONARY:  Clear to auscultation bilaterally without wheezes, rales or  rhonchi.  No accessory muscles.  Good respiratory effort.  CHEST:  Nontender to palpation.  CARDIOVASCULAR:  S1, S2, regular rate and rhythm.  No murmurs, rubs, or gallops.  Trace edema to ankles bilaterally.   Pedal pulses 2+ bilaterally.  GASTROINTESTINAL:  Soft, mild tenderness in the epigastric region without rebound or guarding, nondistended.  No masses, though obese.  Positive bowel sounds.  No hepatosplenomegaly.  MUSCULOSKELETAL:  No swelling or clubbing.  Positive for edema as described above.  Range of motion full in all extremities.  NEUROLOGIC:  Cranial nerves II through XII intact.  No gross focal neurological deficits.  Sensation intact.  Reflexes intact.  SKIN:  Right lower extremity with erythema of the shin without surrounding edema.  No ulcerations, lesions, rashes, or cyanosis, otherwise.  Skin warm, dry.  Turgor intact.  Right lower extremity warm to touch.  PSYCHIATRIC:  Mood and affect appears anxious, agitated, has a tangential speech pattern.  He is, however, awake, alert, oriented x 3.  Insight and judgment appear intact.   LABORATORY DATA:  CT abdomen and pelvis performed showing no acute process.  Ultrasound of the right lower extremity reveals no DVT.  Chest x-ray performed shows no acute cardiopulmonary process.  Remainder of laboratory data:  Sodium 138, potassium 4.1, chloride 106, bicarb 28, BUN 17, creatinine 1.1, glucose 126.  LFTs within normal limits.  WBC 11.8, hemoglobin 13.8, platelets 249.  INR of 1.  Urinalysis negative for evidence of infection.   ASSESSMENT AND PLAN:  A 59 year old Caucasian gentleman with history of obstructive sleep apnea, coronary artery disease, hypertension, presenting with bright red blood per rectum.  1.  Gastrointestinal bleed.  Hemodynamically stable.  Trend CBCs q. 6 hours with a gastroenterology consult.  Type and cross.  Transfusion threshold will be hemoglobin less than 7.  He does not need a transfusion at this time.  2.  Right lower extremity cellulitis.  Clindamycin.   3.  Cough.  No evidence of pneumonia on chest x-ray.  Add a flutter valve and nebs for supportive care.  4.  Coronary artery disease.  Continue with his Lopressor.  5.   VTE prophylaxis with SCDs.  6.  CODE STATUS:  THE PATIENT IS FULL CODE.   TIME SPENT:  45 minutes.     ____________________________ Aaron Mose. Pooja Camuso, MD dkh:ea D: 10/02/2013 23:35:50 ET T: 10/03/2013 02:07:02 ET JOB#: [id number]  cc: Aaron Mose. Medard Decuir, MD, <Dictator> Rolando Whitby Woodfin Ganja MD ELECTRONICALLY SIGNED 10/03/2013 3:19

## 2014-11-28 NOTE — Consult Note (Signed)
General Aspect PCP: [facility] Cards: Liane Comber MD ([place]) ******************************  59 y/o male with h/o CAD s/p CABG x 6 in 2009 who presented to the ED on 5/25 w/ dyspnea and chest tightness. *******************************   Present Illness . 59 y/o male with a h/o CAD s/p prior MI in the 54's with multiple PCI's followed by CABG x 6 in 03/2008.  He also has a h/o morbid obesity, HTN, HL, OSA on CPAP, GERD, panic attacks/anxiety, cataracts, and rectal bleeding.  He was last seen in cardiology clinic in 09/2013 by Dr. Meda Coffee.  At that time, he reported DOE w/o c/p.  He was set up for a lexiscan MV but cancelled b/c he was concerned about RLE swelling.  This, along with BRBPR prompted his admission to [facility] in late February.  He was tx w/ abx for cellulitis and then was d/c'd home and came back for outpt EGD (nl) and colonoscopy (1 sessile transverse colon polyp).  Since March, he reports fairly persistent prodcuctive cough (white sputum) with intermittent fevers/chills and also intermittent LEE (R>L).  He walks several x/wk w/o significant DOE or c/p.  On Sunday night, he awoke around 11:30 with coughing and choking sensation r/t phlegm.  He said that he had so much phlegm that it was coming out of his eyes (?).  In that setting, he became sob and anxious and also developed sscp/tightness.  He called EMS and following their arrival, his ecg was apparently non-acute and he deferred being taken to the ED.  His c/p resolved w/in 10 mins however his coughing and dyspnea persisted all night.  He went to [facility] Monday morning and was referred to the ED.  There, ECG showed lateral twi and inf q's (both old).  CXR showed no acute dzs.  BNP was mildly elevated @ 751.  CE neg.  WBC mildly elevated @ 12.1.  He was afebrile.  He was admitted by IM and echo was performed yesterday, showing nl low-nl LV fxn (50-55%) in the setting of poor acoustic windows.  We've been asked to eval. He is currently c/p  free. Cough has improved.  He has been getting IV lasix and he is -1160 so far.  His weights are inaccurate.   Physical Exam:  GEN well developed, pleasant, nad.   HEENT pink conjunctivae, good dentition   NECK supple  No masses  obese - difficult to assess jvp.  no bruits.   RESP normal resp effort  clear BS   CARD Regular rate and rhythm  Normal, S1, S2  No murmur  distant.   ABD denies tenderness  soft  normal BS   LYMPH negative neck   EXTR negative cyanosis/clubbing, trace bilat LEE, R>L   SKIN normal to palpation, No rashes   NEURO grossly intact, nonfocal.   PSYCH alert, A+O to time, place, person, anxious   Review of Systems:  Subjective/Chief Complaint Anxiety, SOB   General: Fever/chills  Trouble sleeping   Skin: No Complaints   ENT: No Complaints   Eyes: No Complaints   Neck: No Complaints   Respiratory: Frequent cough  Short of breath  Wheezing   Cardiovascular: Chest pain or discomfort  Tightness  Dyspnea  Edema   Gastrointestinal: Rectal Bleeding  dating back to march.  h/o hemorrhoids.   Genitourinary: No Complaints   Vascular: No Complaints   Musculoskeletal: No Complaints   Neurologic: No Complaints   Hematologic: No Complaints   Endocrine: No Complaints   Psychiatric: Anxiety  Review of Systems: All other systems were reviewed and found to be negative   Medications/Allergies Reviewed Medications/Allergies reviewed   Family & Social History:  Family and Social History:  Family History father died of MI @ 21, mother died of MI @ 80, older brother died of MI @ 62.   Social History negative tobacco, negative ETOH, negative Illicit drugs   Place of Living Home  Lives in [place] by himself.       Chronic Diastolic CHF: a. 12/6385 Echo: poor acoustic windows, EF 56-43%, nl RV systolic fxn.   Rectal Bleeding: a. 10/2013 EGD: nl;  b. 10/2013 Colonoscopy: 1 sessile transverse colon polyp.   Cataracts:    Morbid Obesity:    CAD:  a. s/p MI in the 7's @ [facility] with PCI of the LAD and RCA;  b. 09/2000 BMS to the RCA;  c. 07/2001 PCI of OM1 (CBA), BMS RCA;  d. 03/2008 CABGx6: LIMA->LAD, VG->D1, VG->RI->OM1->OM2, VG->RCA.   OSA - uses CPAP:    GERD - Esophageal Reflux:    Hypercholesterolemia:    hypertension:    cardiac bypass: x7         Admit Diagnosis:   CHEST PAIN: Onset Date: 30-Dec-2013, Status: Active, Description: CHEST PAIN  Home Medications:  Medication Instructions Status  sertraline 50 mg oral tablet 2 tab(s) orally once a day Active  ALPRAZolam 0.25 mg oral tablet 1 tab(s) orally every 8 hours, As Needed - for Anxiety, Nervousness Active  albuterol CFC free 90 mcg/inh inhalation aerosol 2 puff(s) inhaled 4 times a day, As Needed Active  NexIUM 40 mg oral delayed release capsule 1 cap(s) orally 2 times a day Active  metoprolol 25 mg oral tablet 1 tab(s) orally 2 times a day Active  Claritin 10 mg oral tablet 1 tab(s) orally once a day Active  furosemide 40 mg oral tablet 1 tab(s) orally once a day Active  Zantac 150 150 mg oral tablet 1 tab(s) orally 2 times a day Active  furosemide 40 mg oral tablet 1 tab(s) orally once a day Active  simvastatin 40 mg oral tablet 1 tab(s) orally once a day (at bedtime) Active  sertraline 50 mg oral tablet 1 tab(s) orally once a day Active   Lab Results:  Cardiology:  25-May-15 16:41   Echo Doppler REASON FOR EXAM:     COMMENTS:     PROCEDURE: [facility] - ECHO DOPPLER COMPLETE(TRANSTHOR)  - Dec 29 2013  4:41PM   RESULT: Echocardiogram Report  Patient Name:   Kinte LEE Altice Date of Exam: 12/29/2013 Medical Rec #:  [id number]             Custom1: Date of Birth:  08-Jan-1956           Height:       65.0 in Patient Age:    89 years           Weight:       282.0 lb Patient Gender: M                  BSA:          2.29 m??  Indications: SOB Sonographer:    Tikeshia Stills RDCS Referring Phys:WIETING, Delfino Lovett, J  Sonographer Comments: Technically difficult study due to  poor echo  windows, suboptimal parasternal window, suboptimal apical window and  suboptimal subcostal window.  Summary:  1. Challenging image quality  2. Left ventricular ejection fraction, by visual estimation, is 50 to  55%.  3. Low  normal global left ventricular systolic function.  4. Unable to exclude regional wall motion abnormality.  5. Normal right ventricular size and systolic function.  6. Unable to estimate RVSP LV DIASTOLIC FUNCTION: MV Peak E: 0.82 m/s Decel Time: 211 msec MV Peak A: 0.60 m/s E/A Ratio: 1.37 SPECTRAL DOPPLER ANALYSIS (where applicable): Mitral Valve: MV P1/2 Time: 61.19 msec MV Area, PHT: 3.60 cm?? Aortic Valve: AoV Max Vel: 1.57 m/s AoV Peak PG: 9.9 mmHg AoV Mean PG: Pulmonic Valve: PV Max Velocity: 1.17 m/s PV Max PG: 5.5 mmHg PV Mean PG:  PHYSICIAN INTERPRETATION: Left Ventricle: LV posterior wall thickness was normal. No left  ventricular hypertrophy. Global LV systolic function was low normal. Left  ventricular ejection fraction, by visual estimation, is 50 to 55%. Right Ventricle: Normal right ventricular size, wall thickness, and  systolic function. The right ventricular size is normal. GlobalRV  systolic function is normal. Left Atrium: The left atrium was not well visualized. The left atrium is  normal in size. Mitral Valve: The mitral valve is not well seen. The mitral valve is  normal in structure. Tricuspid Valve: The tricuspidvalve is not well seen. The tricuspid  valve is normal. Aortic Valve: The aortic valve was not well seen. Mild aortic valve  sclerosis is present, with no evidence of aortic valve stenosis. Pulmonic Valve: The pulmonic valve is not well seen. Aorta: The ascending aorta was not well visualized. The aortic arch was  not well visualized. [id number] Ida Rogue MD Electronically signed by [id number] Ida Rogue MD Signature Date/Time: 12/30/2013/8:00:37 AM  *** Final ***  IMPRESSION: .    Verified By: Minna Merritts, M.D., MD  Routine Chem:  26-May-15 04:13   Glucose, Serum  108  BUN 17  Creatinine (comp) 0.99  Sodium, Serum  135  Potassium, Serum  3.4  Chloride, Serum 102  CO2, Serum 28  Calcium (Total), Serum 9.0  Anion Gap  5  Osmolality (calc) 272  eGFR (African American) >60  eGFR (Non-African American) >60 (eGFR values <36m/min/1.73 m2 may be an indication of chronic kidney disease (CKD). Calculated eGFR is useful in patients with stable renal function. The eGFR calculation will not be reliable in acutely ill patients when serum creatinine is changing rapidly. It is not useful in  patients on dialysis. The eGFR calculation may not be applicable to patients at the low and high extremes of body sizes, pregnant women, and vegetarians.)  Cholesterol, Serum 194  Triglycerides, Serum 158  HDL (INHOUSE) 42  VLDL Cholesterol Calculated 32  LDL Cholesterol Calculated  120 (Result(s) reported on 30 Dec 2013 at 05:14AM.)  Cardiac:  25-May-15 12:07   Troponin I < 0.02 (0.00-0.05 0.05 ng/mL or less: NEGATIVE  Repeat testing in 3-6 hrs  if clinically indicated. >0.05 ng/mL: POTENTIAL  MYOCARDIAL INJURY. Repeat  testing in 3-6 hrs if  clinically indicated. NOTE: An increase or decrease  of 30% or more on serial  testing suggests a  clinically important change)  CK, Total  438 (39-308 NOTE: NEW REFERENCE RANGE  09/08/2013)  CPK-MB, Serum 2.2 (Result(s) reported on 29 Dec 2013 at 12:37PM.)    17:34   Troponin I < 0.02 (0.00-0.05 0.05 ng/mL or less: NEGATIVE  Repeat testing in 3-6 hrs  if clinically indicated. >0.05 ng/mL: POTENTIAL  MYOCARDIAL INJURY. Repeat  testing in 3-6 hrs if  clinically indicated. NOTE: An increase or decrease  of 30% or more on serial  testing suggests a  clinically important change)  20:04   Troponin I < 0.02 (0.00-0.05 0.05 ng/mL or less: NEGATIVE  Repeat testing in 3-6 hrs  if clinically indicated. >0.05 ng/mL: POTENTIAL  MYOCARDIAL  INJURY. Repeat  testing in 3-6 hrs if  clinically indicated. NOTE: An increase or decrease  of 30% or more on serial  testing suggests a  clinically important change)  Routine UA:  25-May-15 14:02   Color (UA) Straw  Clarity (UA) Clear  Glucose (UA) Negative  Bilirubin (UA) Negative  Ketones (UA) Negative  Specific Gravity (UA) 1.009  Blood (UA) Negative  pH (UA) 6.0  Protein (UA) Negative  Nitrite (UA) Negative  Leukocyte Esterase (UA) Negative (Result(s) reported on 29 Dec 2013 at 02:40PM.)  RBC (UA) NONE SEEN  WBC (UA) <1 /HPF  Bacteria (UA) NONE SEEN  Epithelial Cells (UA) NONE SEEN  Result(s) reported on 29 Dec 2013 at 02:40PM.  Routine Hem:  26-May-15 04:13   WBC (CBC)  11.0  RBC (CBC) 5.16  Hemoglobin (CBC) 14.4  Hematocrit (CBC) 45.8  Platelet Count (CBC) 227  MCV 89  MCH 27.8  MCHC  31.4  RDW  15.1  Neutrophil % 60.9  Lymphocyte % 28.0  Monocyte % 8.0  Eosinophil % 2.4  Basophil % 0.7  Neutrophil #  6.7  Lymphocyte # 3.1  Monocyte # 0.9  Eosinophil # 0.3  Basophil # 0.1 (Result(s) reported on 30 Dec 2013 at 05:11AM.)   EKG:  EKG Interp. by me   Interpretation EKG shows nsr, 68, inf q's, lat st dep with twi - old.   Radiology Results:  XRay:    25-May-15 13:05, Chest Portable Single View  Chest Portable Single View   REASON FOR EXAM:    CHEST PAIN tightness  COMMENTS:       PROCEDURE: DXR - DXR PORTABLE CHEST SINGLE VIEW  - Dec 29 2013  1:05PM     CLINICAL DATA:  Short of breath.    EXAM:  PORTABLE CHEST - 1 VIEW    COMPARISON:  12/20/2013    FINDINGS:  Changes from cardiac surgery are stable. Cardiac silhouette is  normal in size. Normal mediastinal and hilar contours. Clear lungs.  Bony thorax is demineralized but intact.     IMPRESSION:  No acute cardiopulmonary disease.      Electronically Signed    By: Lajean Manes M.D.    On: 12/29/2013 13:34         Verified By: Lasandra Beech, M.D.,  [place]:    25-May-15 13:45, [place]  Color Flow Doppler Lower Extrem Right (Leg)  [place] Color Flow Doppler Lower Extrem Right (Leg)   REASON FOR EXAM:    discomfort and swelling - R lower leg.  Neg doppler   in Feb - re-eval for DVT  COMMENTS:       PROCEDURE: [place]  - US DOPPLER LOW EXTR RIGHT  - Dec 29 2013  1:45PM     CLINICAL DATA:  Pain, edema, varicose/ spider veins    EXAM:  RIGHT LOWER EXTREMITY VENOUS DOPPLER ULTRASOUND    TECHNIQUE:  Gray-scale sonography with compression, as well as color and duplex  ultrasound, were performed to evaluate the deep venous system from  the level of the common femoral vein through the popliteal and  proximal calf veins.    COMPARISON:  10/02/2013    FINDINGS:  Normal compressibility of the common femoral, superficial femoral,  and popliteal veins, as well as the proximal calf veins. No filling  defects to suggest  DVT on grayscale or color Doppler imaging.  Doppler waveforms show normal direction of venous flow, normal  respiratory phasicity and response to augmentation. Visualized  segments of the saphenous venous system normal in caliber and  compressibility.     IMPRESSION:  No evidence of RIGHT lower extremity deep vein thrombosis.  Electronically Signed    By: Arne Cleveland M.D.    On: 12/29/2013 13:46         Verified By: Kandis Cocking, M.D.,  Cardiology:    25-May-15 16:41, Echo Doppler  Echo Doppler   REASON FOR EXAM:      COMMENTS:       PROCEDURE: [facility] - ECHO DOPPLER COMPLETE(TRANSTHOR)  - Dec 29 2013  4:41PM     RESULT: Echocardiogram Report    Patient Name:   ESTUS KRAKOWSKI Date of Exam: 12/29/2013  Medical Rec #:  [id number]             Custom1:  Date of Birth:  19-Aug-1955           Height:       65.0 in  Patient Age:    89 years           Weight:       282.0 lb  Patient Gender: M                  BSA:          2.29 m??    Indications: SOB  Sonographer:    Arville Go RDCS  Referring Phys:WIETING, Delfino Lovett, J    Sonographer Comments:  Technically difficult study due to poor echo   windows, suboptimal parasternal window, suboptimal apical window and   suboptimal subcostal window.    Summary:   1. Challenging image quality   2. Left ventricular ejection fraction, by visual estimation, is 50 to   55%.   3. Low normal global left ventricular systolic function.   4. Unable to exclude regional wall motion abnormality.   5. Normal right ventricular size and systolic function.   6. Unable to estimate RVSP  LV DIASTOLIC FUNCTION:  MV Peak E: 0.82 m/s Decel Time: 211 msec  MV Peak A: 0.60 m/s  E/A Ratio: 1.37  SPECTRAL DOPPLER ANALYSIS (where applicable):  Mitral Valve:  MV P1/2 Time: 61.19 msec  MV Area, PHT: 3.60 cm??  Aortic Valve: AoV Max Vel: 1.57 m/s AoV Peak PG: 9.9 mmHg AoV Mean PG:  Pulmonic Valve:  PV Max Velocity: 1.17 m/s PV Max PG: 5.5 mmHg PV Mean PG:    PHYSICIAN INTERPRETATION:  Left Ventricle: LV posterior wall thickness was normal. No left   ventricular hypertrophy. Global LV systolic function was low normal. Left   ventricular ejection fraction, by visual estimation, is 50 to 55%.  Right Ventricle: Normal right ventricular size, wall thickness, and   systolic function. The right ventricular size is normal. GlobalRV   systolic function is normal.  Left Atrium: The left atrium was not well visualized. The left atrium is   normal in size.  Mitral Valve: The mitral valve is not well seen. The mitral valve is   normal in structure.  Tricuspid Valve: The tricuspidvalve is not well seen. The tricuspid   valve is normal.  Aortic Valve: The aortic valve was not well seen. Mild aortic valve   sclerosis is present, with no evidence of aortic valve stenosis.  Pulmonic Valve: The pulmonic valve is not well seen.  Aorta: The ascending aorta was  not well visualized. The aortic arch was   not well visualized.  [id number] Ida Rogue MD  Electronically signed by [id number] Ida Rogue MD  Signature Date/Time:  12/30/2013/8:00:37 AM    *** Final ***    IMPRESSION: .        Verified By: Minna Merritts, M.D., MD    PCN: Hives  Sulfa drugs: Hives  Vital Signs/Nurse's Notes: **Vital Signs.:   26-May-15 07:31  Vital Signs Type Routine  Temperature Temperature (F) 97.7  Celsius 36.5  Temperature Source oral  Pulse Pulse 62  Respirations Respirations 17  Systolic BP Systolic BP 697  Diastolic BP (mmHg) Diastolic BP (mmHg) 95  Mean BP 112  Pulse Ox % Pulse Ox % 94  Pulse Ox Activity Level  At rest  Oxygen Delivery Room Air/ 21 %  *Intake and Output.:   Daily 26-May-15 07:00  Grand Totals Intake:  240 Output:  1400    Net:  -1160 24 Hr.:  -1160  Oral Intake      In:  240  Urine ml     Out:  1400  Length of Stay Totals Intake:  240 Output:  1400    Net:  -1160    Impression 1.  Chest Tightness/CAD:   Likely atypical in setting of  cough, in the setting of acute on diastolic CHF --ECG with chronic lateral ST/T changes, though more pronounced than on past ECGs.  Prior inferior infarct. --Troponins have been negative. --CXR w/o evidence of CHF however he has had waxing and waning R>L LEE and BNP was mildly elevated.  He has had some relief from dyspnea and coughing with lasix.  LE U/S neg for DVT. --Echo showed nl LV fxn. --He was advised to have a lexi MV in Feb but deferred.  We can arrange for this.  This will be a 2 day study 2/2 size, thus in the absence of objective evidence of ischemia, he could be discharged and come back for an outpt nuc study. --Cont asa, bb, statin.  2.  acute on chronic diastolic chf:  sx of Dyspnea/productive cough   Persistent productive cough since March.  better with diuresis.   -- Cont bb.  Resume PO lasix @ d/c. Will need lasix 20 mg daily, extra after lunch for weight gain or SOB sx. Talked about cutting back on his fluid intake (he has been drinking significant fluids, water at home) --Inhalers per IM as he says he has been wheezing @ home  as well.  3.  HTN:  BP variable. --Cont home meds.  Follow.  4.  HL:  On statin.  5.  OSA:  Uses CPAP nightly.  6.  Morbid Obesity:   Really needs nutrition education and weight loss counseling.  Unfortunately, I'm not sure the he has the resources or faculties for this to be successful.   Electronic Signatures: Rogelia Mire (NP)  (Signed 26-May-15 10:29)  Authored: General Aspect/Present Illness, History and Physical Exam, Review of System, Family & Social History, Past Medical History, Home Medications, Labs, EKG , Radiology, Allergies, Vital Signs/Nurse's Notes, Impression/Plan Ida Rogue (MD)  (Signed 26-May-15 13:55)  Authored: General Aspect/Present Illness, History and Physical Exam, Review of System, Past Medical History, Health Issues, Home Medications, EKG , Radiology, Impression/Plan  Co-Signer: General Aspect/Present Illness, Home Medications, Allergies   Last Updated: 26-May-15 13:55 by Ida Rogue (MD)

## 2014-11-28 NOTE — Consult Note (Signed)
PATIENT NAME:  Evan [name], BAUTCH MR#:  [id number] DATE OF BIRTH:  27-Oct-1955  DATE OF CONSULTATION:  10/03/2013  REFERRING PHYSICIAN:  Dr. Lavetta Nielsen CONSULTING PHYSICIAN:  Verdie Shire, MD / Payton Emerald, NP  REASON FOR CONSULTATION: Bright red blood per rectum and reflux.   HISTORY OF PRESENT ILLNESS: Evan [name] is a 59 year old Caucasian gentleman with significant history of obstructive sleep apnea on CPAP, coronary artery disease status post according to the patient 6 vessel bypass, hypertension, and GERD. The patient presented to the Emergency Room with the chief complaint of rectal bleeding.  Through interviewing process, the patient states that over the past couple of months he has had 4 occurrences of bright red blood per rectum, states he has had 6 occurrences of where he had just gets very fatigued, weak, with the feeling of need to defecate, experiences severe abdominal cramping. The patient states yesterday afternoon he had sneezed then onset of cramping to lower abdomen, felt like he was " going to die", felt the need to defecate but was unable, went and laid down, got up again, felt the need to defecate and was successful with passage of dark blood at that time. He states that for the past several weeks due to this cramping sensation that will occur intermittently with the need to defecate that he needs to "wash himself off with a hose", states that he is so sore after defecating that he can just not bend over. Will last for bout 10 minutes in length. Yesterday also "drops of blood" from his rectum after the onset of bleeding. He has also been experiencing excessive phlegm production in the morning like a bubbling sensation causing him difficulty to get his breath. No dysphagia per se but does complain of odynophagia. He was just started on ranitidine 150 mg twice a day a week ago which has helped somewhat, but taking a teaspoon of mustard actually will help comfort the odynophagia. Vomited a week  ago, fever 2 weeks ago, low grade, weight is up and down, does suffer with edema to lower extremities at times. Pressure sensation in bilateral lower quadrants of abdomen, left to right and then right upper quadrant. The patient has never had a colonoscopy or upper endoscopy performed in the past. No melena.   PAST MEDICAL HISTORY: Obstructive sleep apnea and uses CPAP, morbid obesity, reflux, hypertension, hyperlipidemia, coronary artery disease status post CABG, scarlet fever and pneumonia as a child.   PAST SURGICAL HISTORY: CABG in 1998, per patient 6 vessel bypass. Prior to this coronary artery stent placement, angioplasty. Cataract surgery with lens placement.   FAMILY HISTORY: Significant for heart disease. Aunt, maternal, cirrhosis. No family history of neoplasm.   SOCIAL HISTORY: No tobacco. No alcohol use.   REVIEW OF SYSTEMS:  CONSTITUTIONAL: Denies any chills. Fatigue has been intermittent with generalized weakness, usually in association with GI symptoms. No fevers except for 2 weeks ago 1 occurrence.  EYES: No blurred vision, double vision.  EARS, NOSE AND THROAT: No tinnitus or ear pain or hearing loss.  RESPIRATORY: Significant for cough as stated. See HPI. CARDIOVASCULAR: No chest pain, heart palpitations. Significant for bilateral lower extremity edema. In fact right lower extremity is larger in size than left.  GASTROINTESTINAL: See HPI. GENITOURINARY: Denies any dysuria or hematuria.  ENDOCRINE: No nocturia, thyroid problems.  HEME AND LYMPH: Denies significant easy bruising, but has been bleeding per HPI. SKIN: No rashes. Positive for erythema to right lower extremity, felt to be in correlation with  cellulitis. Has had a negative Doppler study. No evidence of DVT.  MUSCULOSKELETAL: No arthralgias or myalgias.  NEUROLOGIC: No CVA or TIA. No paralysis. PSYCHIATRIC: Significant for anxiety. No depression.   PHYSICAL EXAMINATION: VITAL SIGNS: Temperature is 98.1 with a  pulse of 68, respirations are 20, blood pressure is 144/92, and pulse ox is 92% on room air.  GENERAL: Well developed, morbidly obese 59 year old Caucasian gentleman, no acute distress noted but anxious during interviewing process.  HEENT: Normocephalic, atraumatic. Pupils equal and reactive to light. Conjunctivae clear. Sclerae anicteric.  NECK: Supple. Trachea midline. No lymphadenopathy or thyromegaly.  PULMONARY: Symmetric rise and fall of chest. Clear to auscultation throughout. Distant breath sounds anteriorly.  CARDIOVASCULAR: Regular rhythm, S1 and S2. No murmurs. No gallops.  ABDOMEN: Large, nondistended. Bowel sounds in 4 quadrants. No bruits. No masses. Questionable splenomegaly on physical exam but not noted on CT scan.  RECTAL: Deferred. Performed during ER evaluation by MD, guaiac-positive.  MUSCULOSKELETAL: Gait not assessed. EXTREMITIES: Right lower extremity larger in size than left lower extremity. Compression hose in place.  NEUROLOGIC: No gross neurological deficits.  PSYCH: Slightly anxious, alert and oriented x4.   LABORATORY AND DIAGNOSTICS: Chemistry panel on admission: Glucose 126, anion gap is low at 4, otherwise within normal limits. Hepatic panel: AST is elevated at 41 but otherwise within normal limits. Troponin less than 0.02. WBC count was 11.8, hemoglobin 13.8 on admission, and in trending it, looks like every 6 hours, 13.8 and currently 13.9, hematocrit has remained in the range of 43.4 to 41.2. MCV and MCH within normal limits. MCHC was low at 31.9 on admission. Antibody screen is negative. ABO group plus Rh type is A positive. PT is 13.1 with an INR of 1. PTT is 30.3. Urinalysis is essentially within normal limits.   CT scan of abdomen and pelvis with contrast revealed evidence of cardiomegaly with a small umbilical hernia containing fat, otherwise within normal limits.   Chest, PA and lateral: Cardiomegaly without acute disease.   Ultrasound Doppler study: No  evidence of DVT on right lower extremity.   IMPRESSION:  1.  Gastrointestinal bleed, hemodynamically stable. The hemoglobin trending is  every 6 hours. 2.  Right lower extremity cellulitis. 3.  Cough, chronic for the past 6 weeks.  4.  Known history of coronary artery disease.   PLAN: The patient's presentation will be discussed with Dr. Verdie Shire. Recommend continue trending of hemoglobin. At this time though has remained stable over the past 18 plus hours. I do feel the patient will warrant a diagnostic colonoscopy as well as an upper endoscopy for the indications of rectal bleeding as well as odynophagia in the setting of known history of reflux. This will be decided though whether to be done inpatient versus outpatient at this time. For cellulitis, continue with antibiotic therapy as ordered.   These services provided by Payton Emerald, MS, APRN, [facility], FNP under collaborative agreement with Verdie Shire, MD.  ____________________________ Payton Emerald, NP dsh:sb D: 10/03/2013 09:32:44 ET T: 10/03/2013 09:47:43 ET JOB#: [id number]  cc: Payton Emerald, NP, <Dictator> Payton Emerald MD ELECTRONICALLY SIGNED 10/03/2013 14:02

## 2014-11-28 NOTE — Consult Note (Signed)
Brief Consult Note: Diagnosis: cellulitis and edema right lower leg, no evidence of compartment syndrom.   Patient was seen by consultant.   Orders entered.   Comments: ordered AV1's to help with swelling, venous return.  Electronic Signatures: Laurene Footman (MD)  (Signed 23-Sep-15 12:14)  Authored: Brief Consult Note   Last Updated: 23-Sep-15 12:14 by Laurene Footman (MD)

## 2014-11-28 NOTE — Discharge Summary (Signed)
PATIENT NAME:  Evan [name], Evan [name] MR#:  [id number] DATE OF BIRTH:  March 06, 1956  DATE OF ADMISSION:  10/02/2013 DATE OF DISCHARGE:  10/03/2013  PRIMARY CARE PHYSICIAN: At the [facility].   FINAL DIAGNOSES:  1. Rectal bleeding.  2. Gastroesophageal reflux disease.  3. Cellulitis of the right lower extremity.  4. Hypertension.  5. History of coronary artery disease.  6. Hyperlipidemia.   MEDICATIONS ON DISCHARGE: Include metoprolol 25 mg twice a day, furosemide 40 mg daily, potassium chloride 10 mEq daily, simvastatin 40 mg at bedtime, Zoloft 50 mg daily, Nexium increased to 40 mg twice a day, clindamycin 300 mg every 8 hours for 9 more days. The patient will have to stop his aspirin.   HOME HEALTH: None.   DIET: Low-sodium diet, regular consistency.   ACTIVITY: As tolerated.   FOLLOWUP: Dr. Candace Cruise, gastroenterology. His nurse will call you to set up outpatient colonoscopy and endoscopy for next week. Follow up 1 to 2 weeks in the [facility].   HOSPITAL COURSE: The patient was admitted as an observation 10/02/2013. The patient was discharged 10/03/2013. The patient came in with bright red blood per rectum. Admitted with suspected GI bleed. The patient was hemodynamically stable. The patient was ordered CBCs q.6 a GI consult. For a right lower extremity cellulitis, he was started on p.o. clindamycin.   LABORATORY AND RADIOLOGICAL DATA DURING THE HOSPITAL COURSE: Included PT, INR and PTT normal range. Lipase normal. White blood cell count 11.8, H and H 13.8 and 43.4, platelet count of 249. Glucose 126, BUN 17, creatinine 1.1, sodium 138, potassium 4.1, chloride 106, CO2 28, calcium 9.0. Liver function tests: AST slightly elevated at 41. Chest x-ray shows cardiomegaly without acute cardiopulmonary disease. Urinalysis negative. Ultrasound of the right lower extremity shows no DVT right lower extremity. CT scan of the abdomen and pelvis negative. Next hemoglobin 13.8. Next hemoglobin 13.9. Next  hemoglobin 13.8.   HOSPITAL COURSE PER PROBLEM LIST:  1. For the patient's rectal bleeding, admitted with suspected GI bleed. Hemoglobin rock solid for 4 hemoglobins. The patient was seen in consultation by gastroenterology, Dr. Candace Cruise. Since today is Friday, the patient will be discharged home and scheduled for outpatient EGD and colonoscopy with Dr. Candace Cruise.  2. Gastroesophageal reflux disease: The patient increased on the Nexium to 40 mg twice a day.  3. Cellulitis of the right lower extremity: I did not really think that this was that bad. Continue clindamycin course.  4. Hypertension: Continue current medications. No changes made.  5. History of coronary artery disease: Need to stop the aspirin with the rectal bleeding.  6. Hyperlipidemia: On simvastatin.   TIME SPENT ON DISCHARGE: 35 minutes. Case discussed with Dr. Candace Cruise to set up plan. Dr. Myrna Blazer nurse will call him to set up appointment for procedures.   ____________________________ Tana Conch. Leslye Peer, MD rjw:gb D: 10/03/2013 17:55:55 ET T: 10/03/2013 21:38:17 ET JOB#: [id number]  cc: Tana Conch. Leslye Peer, MD, <Dictator> [place] SIGNED 10/07/2013 10:09

## 2014-11-28 NOTE — Consult Note (Signed)
Pt seen and examined. Please see Dawn Harrison's notes. [place] for patient to be discharged today. Will arrange outpt EGD/colonoscopy next week at [place] with propofol. Thanks.  Electronic Signatures: Verdie Shire (MD)  (Signed on 27-Feb-15 15:01)  Authored  Last Updated: 27-Feb-15 15:01 by Verdie Shire (MD)

## 2014-11-29 ENCOUNTER — Emergency Department
Admit: 2014-11-29 | Disposition: A | Discharge status: Home / Self Care | Payer: Self-pay | Admitting: Emergency Medicine

## 2014-11-29 LAB — URINALYSIS, COMPLETE
Bacteria: NONE SEEN
Bilirubin,UR: NEGATIVE
Glucose,UR: NEGATIVE mg/dL (ref 0–75)
Leukocyte Esterase: NEGATIVE
Nitrite: NEGATIVE
Ph: 7 (ref 4.5–8.0)
Protein: NEGATIVE
Specific Gravity: 1.013 (ref 1.003–1.030)
Squamous Epithelial: NONE SEEN

## 2014-11-29 LAB — COMPREHENSIVE METABOLIC PANEL
Albumin: 4.2 g/dL
Alkaline Phosphatase: 77 U/L
Anion Gap: 10 (ref 7–16)
BUN: 21 mg/dL — ABNORMAL HIGH
Bilirubin,Total: 0.7 mg/dL
Calcium, Total: 9 mg/dL
Chloride: 104 mmol/L
Co2: 23 mmol/L
Creatinine: 1.2 mg/dL
EGFR (African American): >60
EGFR (Non-African Amer.): >60
Glucose: 151 mg/dL — ABNORMAL HIGH
Potassium: 4 mmol/L
SGOT(AST): 36 U/L
SGPT (ALT): 40 U/L
Sodium: 137 mmol/L
Total Protein: 7.9 g/dL

## 2014-11-29 LAB — CBC WITH DIFFERENTIAL/PLATELET
Basophil #: 0.1 10*3/uL (ref 0.0–0.1)
Basophil %: 0.7 %
Eosinophil #: 0.1 10*3/uL (ref 0.0–0.7)
Eosinophil %: 0.4 %
HCT: 44.8 % (ref 40.0–52.0)
HGB: 14.5 g/dL (ref 13.0–18.0)
Lymphocyte #: 1.5 10*3/uL (ref 1.0–3.6)
Lymphocyte %: 11.1 %
MCH: 27.8 pg (ref 26.0–34.0)
MCHC: 32.4 g/dL (ref 32.0–36.0)
MCV: 86 fL (ref 80–100)
Monocyte #: 0.8 x10 3/mm (ref 0.2–1.0)
Monocyte %: 5.7 %
Neutrophil #: 10.9 10*3/uL — ABNORMAL HIGH (ref 1.4–6.5)
Neutrophil %: 82.1 %
Platelet: 215 10*3/uL (ref 150–440)
RBC: 5.21 10*6/uL (ref 4.40–5.90)
RDW: 15.4 % — ABNORMAL HIGH (ref 11.5–14.5)
WBC: 13.3 10*3/uL — ABNORMAL HIGH (ref 3.8–10.6)

## 2014-11-29 LAB — LIPASE, BLOOD: Lipase: 30 U/L

## 2015-02-17 ENCOUNTER — Encounter: Payer: Self-pay | Admitting: Urgent Care

## 2015-02-17 ENCOUNTER — Inpatient Hospital Stay
Admission: EM | Admit: 2015-02-17 | Discharge: 2015-02-20 | DRG: 603 | Disposition: A | Discharge status: Home / Self Care | Payer: Medicare Other | Attending: Internal Medicine | Admitting: Internal Medicine

## 2015-02-17 ENCOUNTER — Emergency Department: Payer: Medicare Other

## 2015-02-17 DIAGNOSIS — I252 Old myocardial infarction: Secondary | ICD-10-CM | POA: Diagnosis not present

## 2015-02-17 DIAGNOSIS — E785 Hyperlipidemia, unspecified: Secondary | ICD-10-CM | POA: Diagnosis present

## 2015-02-17 DIAGNOSIS — I10 Essential (primary) hypertension: Secondary | ICD-10-CM | POA: Diagnosis present

## 2015-02-17 DIAGNOSIS — Z7951 Long term (current) use of inhaled steroids: Secondary | ICD-10-CM

## 2015-02-17 DIAGNOSIS — I5032 Chronic diastolic (congestive) heart failure: Secondary | ICD-10-CM | POA: Diagnosis present

## 2015-02-17 DIAGNOSIS — Z8249 Family history of ischemic heart disease and other diseases of the circulatory system: Secondary | ICD-10-CM | POA: Diagnosis not present

## 2015-02-17 DIAGNOSIS — Z955 Presence of coronary angioplasty implant and graft: Secondary | ICD-10-CM | POA: Diagnosis not present

## 2015-02-17 DIAGNOSIS — E119 Type 2 diabetes mellitus without complications: Secondary | ICD-10-CM | POA: Diagnosis present

## 2015-02-17 DIAGNOSIS — Z951 Presence of aortocoronary bypass graft: Secondary | ICD-10-CM

## 2015-02-17 DIAGNOSIS — K219 Gastro-esophageal reflux disease without esophagitis: Secondary | ICD-10-CM | POA: Diagnosis present

## 2015-02-17 DIAGNOSIS — Z9842 Cataract extraction status, left eye: Secondary | ICD-10-CM | POA: Diagnosis not present

## 2015-02-17 DIAGNOSIS — Z79899 Other long term (current) drug therapy: Secondary | ICD-10-CM

## 2015-02-17 DIAGNOSIS — G4733 Obstructive sleep apnea (adult) (pediatric): Secondary | ICD-10-CM | POA: Diagnosis present

## 2015-02-17 DIAGNOSIS — Z882 Allergy status to sulfonamides status: Secondary | ICD-10-CM | POA: Diagnosis not present

## 2015-02-17 DIAGNOSIS — J449 Chronic obstructive pulmonary disease, unspecified: Secondary | ICD-10-CM | POA: Diagnosis present

## 2015-02-17 DIAGNOSIS — Z7982 Long term (current) use of aspirin: Secondary | ICD-10-CM | POA: Diagnosis not present

## 2015-02-17 DIAGNOSIS — L03115 Cellulitis of right lower limb: Principal | ICD-10-CM

## 2015-02-17 DIAGNOSIS — F419 Anxiety disorder, unspecified: Secondary | ICD-10-CM | POA: Diagnosis present

## 2015-02-17 DIAGNOSIS — F410 Panic disorder [episodic paroxysmal anxiety] without agoraphobia: Secondary | ICD-10-CM | POA: Diagnosis present

## 2015-02-17 DIAGNOSIS — I5033 Acute on chronic diastolic (congestive) heart failure: Secondary | ICD-10-CM

## 2015-02-17 DIAGNOSIS — Z8701 Personal history of pneumonia (recurrent): Secondary | ICD-10-CM

## 2015-02-17 DIAGNOSIS — I2510 Atherosclerotic heart disease of native coronary artery without angina pectoris: Secondary | ICD-10-CM | POA: Diagnosis present

## 2015-02-17 DIAGNOSIS — Z880 Allergy status to penicillin: Secondary | ICD-10-CM

## 2015-02-17 LAB — CBC
HCT: 43 % (ref 40.0–52.0)
Hemoglobin: 14 g/dL (ref 13.0–18.0)
MCH: 27.9 pg (ref 26.0–34.0)
MCHC: 32.5 g/dL (ref 32.0–36.0)
MCV: 86 fL (ref 80.0–100.0)
Platelets: 232 10*3/uL (ref 150–440)
RBC: 5 MIL/uL (ref 4.40–5.90)
RDW: 16.2 % — ABNORMAL HIGH (ref 11.5–14.5)
WBC: 15.6 10*3/uL — ABNORMAL HIGH (ref 3.8–10.6)

## 2015-02-17 LAB — BASIC METABOLIC PANEL
Anion gap: 10 (ref 5–15)
BUN: 19 mg/dL (ref 6–20)
CO2: 23 mmol/L (ref 22–32)
Calcium: 9.1 mg/dL (ref 8.9–10.3)
Chloride: 104 mmol/L (ref 101–111)
Creatinine, Ser: 0.84 mg/dL (ref 0.61–1.24)
GFR calc Af Amer: >60 mL/min (ref >60)
GFR calc non Af Amer: >60 mL/min (ref >60)
Glucose, Bld: 153 mg/dL — ABNORMAL HIGH (ref 65–99)
Potassium: 3.6 mmol/L (ref 3.5–5.1)
Sodium: 137 mmol/L (ref 135–145)

## 2015-02-17 LAB — BRAIN NATRIURETIC PEPTIDE: B Natriuretic Peptide: 104 pg/mL — ABNORMAL HIGH (ref 0.0–100.0)

## 2015-02-17 IMAGING — CR DG CHEST 1V PORT
1 series · 1 of 1 positions shown · non-contrast
Comparison: [DATE]

CLINICAL DATA: Orthopnea. Peripheral edema. Coronary artery
disease. Previous myocardial infarct.

EXAM:
PORTABLE CHEST - 1 VIEW

[ap]
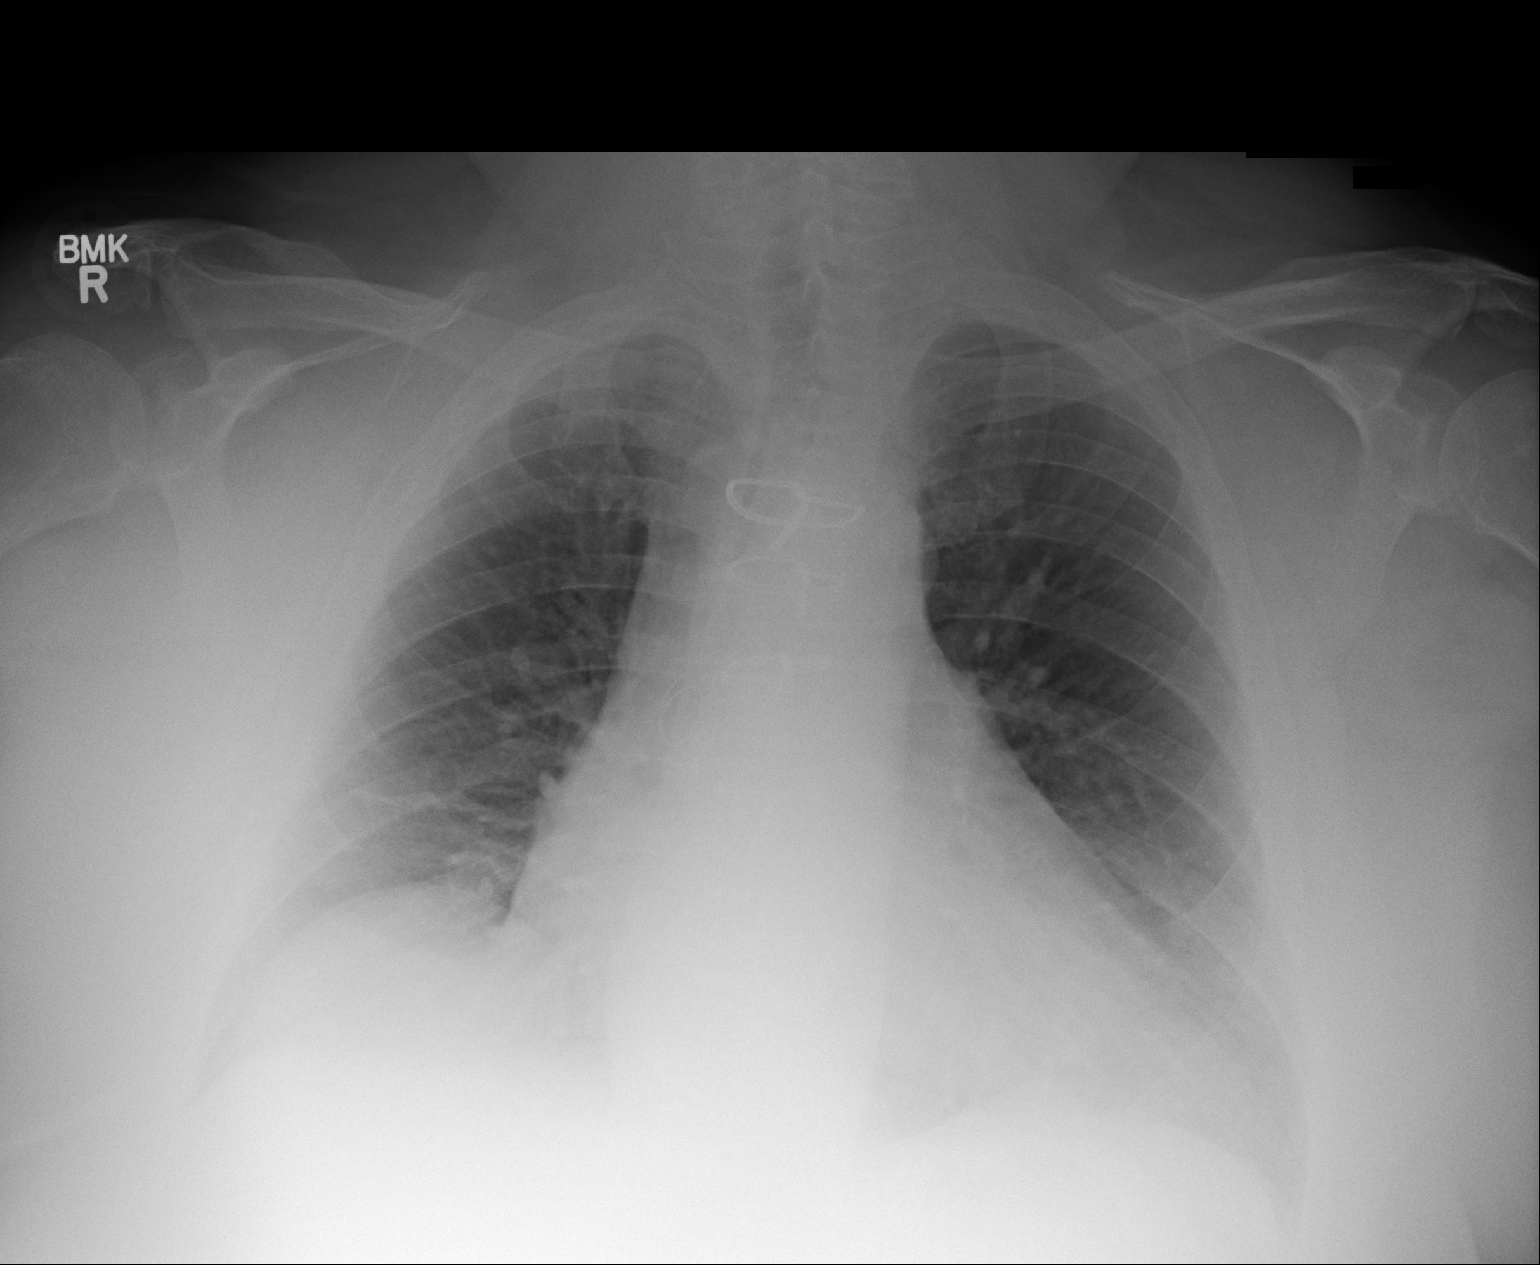

[1 of 1 positions shown; findings below may reference images not displayed]

FINDINGS: Mild cardiomegaly stable. Both lungs are clear. No evidence of
pleural effusion. Prior CABG noted.
IMPRESSION: Stable mild cardiomegaly.  No active lung disease.

## 2015-02-17 MED ORDER — POLYETHYLENE GLYCOL 3350 17 GM/SCOOP PO POWD
1.0000 | Freq: Every day | ORAL | Status: DC
  Filled 2015-02-17: qty 255

## 2015-02-17 MED ORDER — FUROSEMIDE 40 MG PO TABS
40.0000 mg | ORAL_TABLET | Freq: Two times a day (BID) | ORAL | Status: DC
  Administered 2015-02-18 – 2015-02-20 (×5): 40 mg via ORAL
  Filled 2015-02-17 (×5): qty 1

## 2015-02-17 MED ORDER — ALBUTEROL SULFATE (2.5 MG/3ML) 0.083% IN NEBU: 2.5000 mg | INHALATION_SOLUTION | Freq: Four times a day (QID) | RESPIRATORY_TRACT | Status: DC | PRN

## 2015-02-17 MED ORDER — ONDANSETRON HCL 4 MG/2ML IJ SOLN: 4.0000 mg | Freq: Four times a day (QID) | INTRAMUSCULAR | Status: DC | PRN

## 2015-02-17 MED ORDER — MORPHINE SULFATE 2 MG/ML IJ SOLN: 2.0000 mg | INTRAMUSCULAR | Status: DC | PRN

## 2015-02-17 MED ORDER — HYDROXYZINE HCL 50 MG PO TABS
25.0000 mg | ORAL_TABLET | Freq: Every day | ORAL | Status: DC
  Administered 2015-02-18 – 2015-02-19 (×3): 25 mg via ORAL
  Filled 2015-02-17 (×4): qty 1

## 2015-02-17 MED ORDER — INSULIN ASPART 100 UNIT/ML ~~LOC~~ SOLN: 0.0000 [IU] | Freq: Every day | SUBCUTANEOUS | Status: DC

## 2015-02-17 MED ORDER — VANCOMYCIN HCL IN DEXTROSE 1-5 GM/200ML-% IV SOLN
1000.0000 mg | Freq: Three times a day (TID) | INTRAVENOUS | Status: DC
Start: 2015-02-18 — End: 2015-02-19
  Administered 2015-02-18 – 2015-02-19 (×4): 1000 mg via INTRAVENOUS
  Filled 2015-02-17 (×7): qty 200

## 2015-02-17 MED ORDER — ACETAMINOPHEN 325 MG PO TABS: 650.0000 mg | ORAL_TABLET | Freq: Four times a day (QID) | ORAL | Status: DC | PRN

## 2015-02-17 MED ORDER — ASPIRIN 81 MG PO TBEC
81.0000 mg | DELAYED_RELEASE_TABLET | Freq: Two times a day (BID) | ORAL | Status: DC
  Administered 2015-02-18 – 2015-02-20 (×6): 81 mg via ORAL
  Filled 2015-02-17 (×13): qty 1

## 2015-02-17 MED ORDER — ONDANSETRON HCL 4 MG PO TABS: 4.0000 mg | ORAL_TABLET | Freq: Four times a day (QID) | ORAL | Status: DC | PRN

## 2015-02-17 MED ORDER — VANCOMYCIN HCL 10 G IV SOLR
2000.0000 mg | Freq: Once | INTRAVENOUS | Status: AC
  Administered 2015-02-17: 2000 mg via INTRAVENOUS
  Filled 2015-02-17: qty 2000

## 2015-02-17 MED ORDER — HEPARIN SODIUM (PORCINE) 5000 UNIT/ML IJ SOLN
5000.0000 [IU] | Freq: Three times a day (TID) | INTRAMUSCULAR | Status: DC
  Administered 2015-02-17 – 2015-02-20 (×8): 5000 [IU] via SUBCUTANEOUS
  Filled 2015-02-17 (×8): qty 1

## 2015-02-17 MED ORDER — SIMVASTATIN 40 MG PO TABS
40.0000 mg | ORAL_TABLET | Freq: Every day | ORAL | Status: DC
  Administered 2015-02-18 – 2015-02-20 (×3): 40 mg via ORAL
  Filled 2015-02-17 (×2): qty 1

## 2015-02-17 MED ORDER — FAMOTIDINE 20 MG PO TABS
20.0000 mg | ORAL_TABLET | Freq: Every day | ORAL | Status: DC
  Administered 2015-02-18 – 2015-02-20 (×3): 20 mg via ORAL
  Filled 2015-02-17 (×3): qty 1

## 2015-02-17 MED ORDER — METOPROLOL TARTRATE 25 MG PO TABS
25.0000 mg | ORAL_TABLET | Freq: Two times a day (BID) | ORAL | Status: DC
  Administered 2015-02-18 – 2015-02-20 (×6): 25 mg via ORAL
  Filled 2015-02-17 (×6): qty 1

## 2015-02-17 MED ORDER — PANTOPRAZOLE SODIUM 40 MG PO TBEC
40.0000 mg | DELAYED_RELEASE_TABLET | Freq: Every day | ORAL | Status: DC
  Administered 2015-02-18 – 2015-02-20 (×3): 40 mg via ORAL
  Filled 2015-02-17 (×3): qty 1

## 2015-02-17 MED ORDER — FLUTICASONE PROPIONATE 50 MCG/ACT NA SUSP
1.0000 | Freq: Every day | NASAL | Status: DC
  Administered 2015-02-18 – 2015-02-20 (×3): 1 via NASAL
  Filled 2015-02-17: qty 16

## 2015-02-17 MED ORDER — ACETAMINOPHEN 650 MG RE SUPP: 650.0000 mg | Freq: Four times a day (QID) | RECTAL | Status: DC | PRN

## 2015-02-17 MED ORDER — OXYCODONE HCL 5 MG PO TABS
5.0000 mg | ORAL_TABLET | ORAL | Status: DC | PRN
  Administered 2015-02-19 (×2): 5 mg via ORAL
  Filled 2015-02-17 (×2): qty 1

## 2015-02-17 MED ORDER — INSULIN ASPART 100 UNIT/ML ~~LOC~~ SOLN
0.0000 [IU] | Freq: Three times a day (TID) | SUBCUTANEOUS | Status: DC
  Administered 2015-02-18 (×2): 2 [IU] via SUBCUTANEOUS
  Administered 2015-02-19: 5 [IU] via SUBCUTANEOUS
  Administered 2015-02-19: 3 [IU] via SUBCUTANEOUS
  Filled 2015-02-17: qty 5
  Filled 2015-02-17 (×2): qty 2
  Filled 2015-02-17: qty 3

## 2015-02-17 NOTE — ED Provider Notes (Addendum)
[facility] Emergency Department Provider Note  ____________________________________________  Time seen: 7:20 PM  I have reviewed the triage vital signs and the nursing notes.   HISTORY  Chief Complaint Recurrent Skin Infections    HPI Evan [name] is a 59 y.o. male who complains of worsening right lower extremity pain and swelling redness and fluid drainage over the last 2 days. He has a history of cellulitis in the right leg and has had to be on antibiotics and admitted in the past. No fever or chills. He does report nausea and decreased appetite recently in the setting of this worsened right leg infection. He also notes that he is short of breath when he lays flat and has difficulty walking at present time. His weight is about 10 pounds over his baseline.  Denies numbness tingling or weakness in the foot. No coldness   Past Medical History  Diagnosis Date  . CAD (coronary artery disease)   . Hypertension   . Hyperlipidemia   . Gastroesophageal reflux   . Obstructive sleep apnea   . History of pneumonia     Remote  . History of scarlet fever     Childhood  . Obesity   . Cellulitis   . Myocardial infarction 2009  . COPD (chronic obstructive pulmonary disease)   . H/O: GI bleed   . Anxiety disorder   . Panic disorder     Patient Active Problem List   Diagnosis Date Noted  . Chronic venous insufficiency 09/10/2014  . Chronic diastolic CHF (congestive heart failure) 01/09/2014  . GERD (gastroesophageal reflux disease) 09/12/2013  . DOE (dyspnea on exertion) 09/12/2013  . Obesity, morbid (more than 100 lbs over ideal weight or BMI > 40) 07/26/2011  . OLD MYOCARDIAL INFARCTION 03/02/2010  . HYPERLIPIDEMIA, MIXED 03/20/2009  . HYPERTENSION, BENIGN 03/20/2009  . CORONARY ATHEROSCLEROSIS, ARTERY BYPASS GRAFT 03/20/2009    Past Surgical History  Procedure Laterality Date  . Cardiac catheterization    . Coronary angioplasty with stent  placement  2002  . Coronary angioplasty with stent placement  1999  . Cataract extraction Left   . Coronary artery bypass graft      x7    Current Outpatient Rx  Name  Route  Sig  Dispense  Refill  . albuterol (PROVENTIL) (2.5 MG/3ML) 0.083% nebulizer solution   Nebulization   Take 2.5 mg by nebulization every 6 (six) hours as needed for wheezing or shortness of breath.         Marland Kitchen aspirin 81 MG EC tablet   Oral   Take 81 mg by mouth 2 (two) times daily.          . clindamycin (CLEOCIN) 300 MG capsule   Oral   Take 300 mg by mouth 4 (four) times daily.          Marland Kitchen esomeprazole (NEXIUM) 40 MG capsule   Oral   Take 40 mg by mouth daily at 12 noon.         . fluticasone (FLONASE) 50 MCG/ACT nasal spray   Each Nare   Place 1 spray into both nostrils daily.          . furosemide (LASIX) 40 MG tablet      Take 80 mg in the am with 40 mg in the pm.         . hydrOXYzine (ATARAX/VISTARIL) 25 MG tablet   Oral   Take 25 mg by mouth at bedtime.         Marland Kitchen  metoprolol tartrate (LOPRESSOR) 25 MG tablet   Oral   Take 25 mg by mouth 2 (two) times daily.           . polyethylene glycol powder (GLYCOLAX/MIRALAX) powder   Oral   Take 1 Container by mouth daily.          . potassium chloride (K-DUR) 10 MEQ tablet   Oral   Take 1 tablet (10 mEq total) by mouth daily.   30 tablet   6   . PROAIR HFA 108 (90 BASE) MCG/ACT inhaler   Inhalation   Inhale 1 puff into the lungs every 4 (four) hours as needed.          . ranitidine (ZANTAC) 150 MG capsule   Oral   Take 1 capsule (150 mg total) by mouth 2 (two) times daily.   180 capsule   1   . simvastatin (ZOCOR) 40 MG tablet   Oral   Take 1 tablet (40 mg total) by mouth daily.   30 tablet   3     Allergies Penicillin g and Sulfa antibiotics  Family History  Problem Relation Age of Onset  . Coronary artery disease Other   . Heart attack Mother   . Hypertension Mother   . Hyperlipidemia Mother   . Heart  attack Brother 57    MI    Social History History  Substance Use Topics  . Smoking status: Never Smoker   . Smokeless tobacco: Never Used  . Alcohol Use: No    Review of Systems  Constitutional: No fever or chills. No weight changes Eyes:No blurry vision or double vision.  ENT: No sore throat. Cardiovascular: No chest pain. Respiratory: Cough when lying flat. Gastrointestinal: Generalized intermittent migratory abdominal pain, sharp. None at present..  No BRBPR or melena. Genitourinary: Negative for dysuria, urinary retention, bloody urine, or difficulty urinating. Musculoskeletal: Negative for back pain. No joint swelling or pain. Skin: Diffuse red rash over the right leg. Neurological: Negative for headaches, focal weakness or numbness. Psychiatric:No anxiety or depression.   Endocrine:No hot/cold intolerance, changes in energy, or sleep difficulty.  10-point ROS otherwise negative.  ____________________________________________   PHYSICAL EXAM:  VITAL SIGNS: ED Triage Vitals  Enc Vitals Group     BP 02/17/15 1908 135/71 mmHg     Pulse Rate 02/17/15 1908 87     Resp 02/17/15 1908 20     Temp 02/17/15 1908 98.1 F (36.7 C)     Temp Source 02/17/15 1908 Oral     SpO2 02/17/15 1908 97 %     Weight 02/17/15 1908 285 lb (129.275 kg)     Height 02/17/15 1908 5\' 5"  (1.651 m)     Head Cir --      Peak Flow --      Pain Score 02/17/15 1908 7     Pain Loc --      Pain Edu? --      Excl. in [place]? --      Constitutional: Alert and oriented. Well appearing and in no distress sitting upright Eyes: No scleral icterus. No conjunctival pallor. PERRL. EOMI ENT   Head: Normocephalic and atraumatic.   Nose: No congestion/rhinnorhea. No septal hematoma   Mouth/Throat: Dry mucous membranes, no pharyngeal erythema. No peritonsillar mass. No uvula shift.   Neck: No stridor. No SubQ emphysema. No meningismus. Positive JVD when  supine Hematological/Lymphatic/Immunilogical: No cervical lymphadenopathy. Cardiovascular: RRR. Normal and symmetric distal pulses are present in all extremities. No murmurs, rubs, or  gallops. Respiratory: Crackles at both bases. Tachypnea and shortness of breath when lying supine. Gastrointestinal: Soft and nontender. No distention. There is no CVA tenderness.  No rebound, rigidity, or guarding. Abdomen is distended and somewhat tense Genitourinary: deferred Musculoskeletal: Erythema and induration from the right ankle to the right knee and medial distal femur. This is contiguous with an irregular border and tender and warm to the touch. Consistent with cellulitis. No focal fluid collection or fluctuance. There is some blistering and drainage just proximal to the right ankle. Range of motion intact. No crepitus Neurologic:   Normal speech and language.  CN 2-10 normal. Motor grossly intact. Neurovascular intact distal to the right leg cellulitis No gross focal neurologic deficits are appreciated.  Skin:  Skin is warm, dry and intact. No rash noted.  No petechiae, purpura, or bullae. Psychiatric: Mood and affect are normal. Speech and behavior are normal. Patient exhibits appropriate insight and judgment.  ____________________________________________    LABS (pertinent positives/negatives) (all labs ordered are listed, but only abnormal results are displayed) Labs Reviewed  CBC - Abnormal; Notable for the following:    WBC 15.6 (*)    RDW 16.2 (*)    All other components within normal limits  BASIC METABOLIC PANEL - Abnormal; Notable for the following:    Glucose, Bld 153 (*)    All other components within normal limits  CULTURE, BLOOD (SINGLE)  BRAIN NATRIURETIC PEPTIDE   ____________________________________________   EKG  Interpreted by me Normal sinus rhythm rate of 86, normal axis intervals and ST segments. There are inferior Q waves. There are inverted T waves in the lateral  leads.  ____________________________________________    RADIOLOGY  Chest x-ray unremarkable  ____________________________________________   PROCEDURES  ____________________________________________   INITIAL IMPRESSION / ASSESSMENT AND PLAN / ED COURSE  Pertinent labs & imaging results that were available during my care of the patient were reviewed by me and considered in my medical decision making (see chart for details).  Patient presents with a substantial right leg cellulitis. He's been worked up with similar presentations in the past, and this appears to be consistent with prior cellulitis. Have low suspicion for osteomyelitis or necrotizing fasciitis. Low suspicion for DVT. We will start IV vancomycin. We'll hold off on any supplemental IV fluids at this time because he's ED hemodynamically stable and has a history of CHF with evidence of acute on chronic heart failure at present. We will plan to admit the patient for further treatment and monitoring of his condition. EKG and chest x-ray pending.  ----------------------------------------- 8:18 PM on 02/17/2015 -----------------------------------------   Patient does not have any symptoms to suggest ACS, so the EKG findings are likely chronic and no further workup is needed at this time. Labs significant for a leukocytosis consistent with acute infection.  ____________________________________________   FINAL CLINICAL IMPRESSION(S) / ED DIAGNOSES  Final diagnoses:  Cellulitis of right leg  Acute on chronic diastolic congestive heart failure      Carrie Mew, MD 02/17/15 2015  Carrie Mew, MD 02/17/15 2018

## 2015-02-17 NOTE — ED Notes (Signed)
Patient presents with c/o RLE pain, swelling, and redness; consistent with significant cellulitis. Patient has been here in the past for the same several times over the last 9 motnhs. Patient with weeping edema noted; pants are wet.

## 2015-02-17 NOTE — Progress Notes (Signed)
ANTIBIOTIC CONSULT NOTE - INITIAL  Pharmacy Consult for Vancomycin Indication: Cellulitis  Allergies  Allergen Reactions  . Penicillin G Hives  . Sulfa Antibiotics Hives    Patient Measurements: Height: 5\' 5"  (165.1 cm) Weight: 288 lb 6.4 oz (130.817 kg) IBW/kg (Calculated) : 61.5 Adjusted Body Weight: 89.2 kg  Vital Signs: Temp: 98.4 F (36.9 C) (07/13 2202) Temp Source: Oral (07/13 2202) BP: 132/94 mmHg (07/13 2202) Pulse Rate: 93 (07/13 2202) Intake/Output from previous day:   Intake/Output from this shift:    Labs:  Recent Labs  02/17/15 1937  WBC 15.6*  HGB 14.0  PLT 232  CREATININE 0.84   Estimated Creatinine Clearance: 118 mL/min (by C-G formula based on Cr of 0.84). No results for input(s): VANCOTROUGH, VANCOPEAK, VANCORANDOM, GENTTROUGH, GENTPEAK, GENTRANDOM, TOBRATROUGH, TOBRAPEAK, TOBRARND, AMIKACINPEAK, AMIKACINTROU, AMIKACIN in the last 72 hours.   Microbiology: No results found for this or any previous visit (from the past 720 hour(s)).  Medical History: Past Medical History  Diagnosis Date  . CAD (coronary artery disease)   . Hypertension   . Hyperlipidemia   . Gastroesophageal reflux   . Obstructive sleep apnea   . History of pneumonia     Remote  . History of scarlet fever     Childhood  . Obesity   . Cellulitis   . Myocardial infarction 2009  . COPD (chronic obstructive pulmonary disease)   . H/O: GI bleed   . Anxiety disorder   . Panic disorder     Medications:  Scheduled:  . aspirin  81 mg Oral BID  . [START ON 02/18/2015] famotidine  20 mg Oral Daily  . [START ON 02/18/2015] fluticasone  1 spray Each Nare Daily  . [START ON 02/18/2015] furosemide  40 mg Oral BID  . heparin  5,000 Units Subcutaneous 3 times per day  . hydrOXYzine  25 mg Oral QHS  . [START ON 02/18/2015] insulin aspart  0-15 Units Subcutaneous TID WC  . insulin aspart  0-5 Units Subcutaneous QHS  . metoprolol tartrate  25 mg Oral BID  . [START ON 02/18/2015]  pantoprazole  40 mg Oral Daily  . [START ON 02/18/2015] polyethylene glycol powder  1 Container Oral Daily  . [START ON 02/18/2015] simvastatin  40 mg Oral Daily  . [START ON 02/18/2015] vancomycin  1,000 mg Intravenous Q8H   Assessment: Patient being treated for cellulitis of the right leg PK:  Ke=0.102; t1/2: 6.7 hrs; VD: 62.4 L  Goal of Therapy:  Vancomycin trough level 10-15 mcg/ml  Plan:  Patient received Vancomycin 2 g IV in ED. Will start Vancomycin 1 g IV q8 hours @ 06:00. Trough level ordered to be drawn prior to the 1400 dose on 7/15.   Follow up culture results  Hyatt Capobianco D 02/17/2015,10:54 PM

## 2015-02-17 NOTE — H&P (Signed)
[facility] [place] NAME: Evan [name]    MR#:  [id number]  DATE OF BIRTH:  February 04, 1956   DATE OF ADMISSION:  02/17/2015  PRIMARY CARE PHYSICIAN: Joanie Coddington, MD   REQUESTING/REFERRING PHYSICIAN: Joni Fears  CHIEF COMPLAINT:   Chief Complaint  Patient presents with  . Recurrent Skin Infections    HISTORY OF PRESENT ILLNESS:  Evan [name]  is a 59 y.o. male with a known history of obstructive sleep apnea on CPAP therapy, type 2 diabetes non-insulin-requiring, diastolic congestive heart failure presenting with right leg swelling. He describes having approximately 2 weeks duration of worsening right lower extremity edema, erythema now to the point of having weeping edema for the last 1-2 day duration. He describes this sensation as painful, throbbing/stinging in quality, 5-6 out of 10 intensity, no radiation, worse with movement, no relieving factors he denies any frank fevers or chills given the duration of symptoms and worsening symptoms decided to present to Hospital further workup and evaluation. Of note recent diagnosis sinus rhythm extremity improved in the hospital with IV antibiotics.  PAST MEDICAL HISTORY:   Past Medical History  Diagnosis Date  . CAD (coronary artery disease)   . Hypertension   . Hyperlipidemia   . Gastroesophageal reflux   . Obstructive sleep apnea   . History of pneumonia     Remote  . History of scarlet fever     Childhood  . Obesity   . Cellulitis   . Myocardial infarction 2009  . COPD (chronic obstructive pulmonary disease)   . H/O: GI bleed   . Anxiety disorder   . Panic disorder     PAST SURGICAL HISTORY:   Past Surgical History  Procedure Laterality Date  . Cardiac catheterization    . Coronary angioplasty with stent placement  2002  . Coronary angioplasty with stent placement  1999  . Cataract extraction Left   . Coronary artery bypass graft      x7    SOCIAL HISTORY:    History  Substance Use Topics  . Smoking status: Never Smoker   . Smokeless tobacco: Never Used  . Alcohol Use: No    FAMILY HISTORY:   Family History  Problem Relation Age of Onset  . Coronary artery disease Other   . Heart attack Mother   . Hypertension Mother   . Hyperlipidemia Mother   . Heart attack Brother 78    MI    DRUG ALLERGIES:   Allergies  Allergen Reactions  . Penicillin G Hives  . Sulfa Antibiotics Hives    REVIEW OF SYSTEMS:  REVIEW OF SYSTEMS:  CONSTITUTIONAL: Denies fevers, chills, positive fatigue, weakness.  EYES: Denies blurred vision, double vision, or eye pain.  EARS, NOSE, THROAT: Denies tinnitus, ear pain, hearing loss.  RESPIRATORY: denies cough, shortness of breath, wheezing  CARDIOVASCULAR: Denies chest pain, palpitations, positive edema.  GASTROINTESTINAL: Denies nausea, vomiting, diarrhea, abdominal pain.  GENITOURINARY: Denies dysuria, hematuria.  ENDOCRINE: Denies nocturia or thyroid problems. HEMATOLOGIC AND LYMPHATIC: Denies easy bruising or bleeding.  SKIN: Positive erythematous rash right leg otherwise Denies rash or lesions.  MUSCULOSKELETAL: Denies pain in neck, back, shoulder, knees, hips, or further arthritic symptoms.  NEUROLOGIC: Denies paralysis, paresthesias.  PSYCHIATRIC: Denies anxiety or depressive symptoms. Otherwise full review of systems performed by me is negative.   MEDICATIONS AT HOME:   Prior to Admission medications   Medication Sig Start Date End Date Taking? Authorizing Provider  albuterol (PROVENTIL) (2.5 MG/3ML)  0.083% nebulizer solution Take 2.5 mg by nebulization every 6 (six) hours as needed for wheezing or shortness of breath.    Historical Provider, MD  aspirin 81 MG EC tablet Take 81 mg by mouth 2 (two) times daily.     Historical Provider, MD  clindamycin (CLEOCIN) 300 MG capsule Take 300 mg by mouth 4 (four) times daily.  07/15/14   Historical Provider, MD  esomeprazole (NEXIUM) 40 MG capsule  Take 40 mg by mouth daily at 12 noon.    Historical Provider, MD  fluticasone (FLONASE) 50 MCG/ACT nasal spray Place 1 spray into both nostrils daily.  12/31/13   Historical Provider, MD  furosemide (LASIX) 40 MG tablet Take 80 mg in the am with 40 mg in the pm.    Historical Provider, MD  hydrOXYzine (ATARAX/VISTARIL) 25 MG tablet Take 25 mg by mouth at bedtime.    Historical Provider, MD  metoprolol tartrate (LOPRESSOR) 25 MG tablet Take 25 mg by mouth 2 (two) times daily.      Historical Provider, MD  polyethylene glycol powder (GLYCOLAX/MIRALAX) powder Take 1 Container by mouth daily.  10/06/13   Historical Provider, MD  potassium chloride (K-DUR) 10 MEQ tablet Take 1 tablet (10 mEq total) by mouth daily. 04/14/14   Minna Merritts, MD  PROAIR HFA 108 (90 BASE) MCG/ACT inhaler Inhale 1 puff into the lungs every 4 (four) hours as needed.  12/22/13   Historical Provider, MD  ranitidine (ZANTAC) 150 MG capsule Take 1 capsule (150 mg total) by mouth 2 (two) times daily. 01/20/14   Dorothy Spark, MD  simvastatin (ZOCOR) 40 MG tablet Take 1 tablet (40 mg total) by mouth daily. 01/20/14   Dorothy Spark, MD      VITAL SIGNS:  Blood pressure 135/71, pulse 87, temperature 98.1 F (36.7 C), temperature source Oral, resp. rate 20, height 5\' 5"  (1.651 m), weight 285 lb (129.275 kg), SpO2 97 %.  PHYSICAL EXAMINATION:  VITAL SIGNS: Filed Vitals:   02/17/15 1908  BP: 135/71  Pulse: 87  Temp: 98.1 F (36.7 C)  Resp: 20   GENERAL:59 y.o.male currently in no acute distress. Obese HEAD: Normocephalic, atraumatic.  EYES: Pupils equal, round, reactive to light. Extraocular muscles intact. No scleral icterus.  MOUTH: Moist mucosal membrane. Dentition intact. No abscess noted.  EAR, NOSE, THROAT: Clear without exudates. No external lesions.  NECK: Supple. No thyromegaly. No nodules. No JVD.  PULMONARY: Clear to ascultation, without wheeze rails or rhonci. No use of accessory muscles, Good respiratory  effort. good air entry bilaterally CHEST: Nontender to palpation.  CARDIOVASCULAR: S1 and S2. Regular rate and rhythm. No murmurs, rubs, or gallops. 2+ edema right lower extremity. Pedal pulses 2+ bilaterally.  GASTROINTESTINAL: Soft, nontender, nondistended. No masses. Positive bowel sounds. No hepatosplenomegaly.  MUSCULOSKELETAL: No swelling, clubbing, or edema. Range of motion full in all extremities.  NEUROLOGIC: Cranial nerves II through XII are intact. No gross focal neurological deficits. Sensation intact. Reflexes intact.  SKIN: Erythematous rash right lower extremity from the dorsum of the foot to mid thigh warm to touch with crusting and weeping edema on the medial aspect lower leg otherwise No further ulceration, lesions, rashes, or cyanosis. Skin warm and dry. Turgor intact.  PSYCHIATRIC: Mood, affect within normal limits. The patient is awake, alert and oriented x 3. Insight, judgment intact.    LABORATORY PANEL:   CBC  Recent Labs Lab 02/17/15 1937  WBC 15.6*  HGB 14.0  HCT 43.0  PLT 232   ------------------------------------------------------------------------------------------------------------------  Chemistries   Recent Labs Lab 02/17/15 1937  NA 137  K 3.6  CL 104  CO2 23  GLUCOSE 153*  BUN 19  CREATININE 0.84  CALCIUM 9.1   ------------------------------------------------------------------------------------------------------------------  Cardiac Enzymes No results for input(s): TROPONINI in the last 168 hours. ------------------------------------------------------------------------------------------------------------------  RADIOLOGY:  Dg Chest Portable 1 View  02/17/2015   CLINICAL DATA:  Orthopnea. Peripheral edema. Coronary artery disease. Previous myocardial infarct.  EXAM: PORTABLE CHEST - 1 VIEW  COMPARISON:  08/27/2014  FINDINGS: Mild cardiomegaly stable. Both lungs are clear. No evidence of pleural effusion. Prior CABG noted.  IMPRESSION:  Stable mild cardiomegaly.  No active lung disease.   Electronically Signed   By: Earle Gell M.D.   On: 02/17/2015 20:06    EKG:   Orders placed or performed during the hospital encounter of 02/17/15  . ED EKG  . ED EKG    IMPRESSION AND PLAN:   36-year-old gentleman history of diastolic and his heart failure, sleep apnea, type 2 diabetes non-insulin-requiring presenting with right lower extremity edema.  1. Cellulitis right lower extremity: Follow culture data, placed on IV vancomycin continue vancomycin for now taper antibiotics according to culture data returns 2. Type 2 diabetes, uncomplicated: Hold oral agents at insulin sliding scale with Accu-Cheks 3. GERD without esophagitis: PPI therapy 4. Obstructive sleep apnea: Continue CPAP at night 5. Diastolic congestive heart failure: Continue aspirin, Lasix, metoprolol 6. Venous thrombus embolism prophylactic: Heparin subcutaneous    All the records are reviewed and case discussed with ED provider. Management plans discussed with the patient, family and they are in agreement.  CODE STATUS: Full  TOTAL TIME TAKING CARE OF THIS PATIENT: 45 minutes.    Dorothy Landgrebe,  Karenann Cai.D on 02/17/2015 at 9:14 PM  Between 7am to 6pm - Pager - [phone]  After 6pm: House Pager: - [phone]  [facility]  Office  [phone]  CC: Primary care physician; Joanie Coddington, MD

## 2015-02-18 LAB — GLUCOSE, CAPILLARY
Glucose-Capillary: 136 mg/dL — ABNORMAL HIGH (ref 65–99)
Glucose-Capillary: 140 mg/dL — ABNORMAL HIGH (ref 65–99)
Glucose-Capillary: 142 mg/dL — ABNORMAL HIGH (ref 65–99)
Glucose-Capillary: 145 mg/dL — ABNORMAL HIGH (ref 65–99)

## 2015-02-18 LAB — COMPREHENSIVE METABOLIC PANEL
ALT: 24 U/L (ref 17–63)
AST: 21 U/L (ref 15–41)
Albumin: 3.6 g/dL (ref 3.5–5.0)
Alkaline Phosphatase: 61 U/L (ref 38–126)
Anion gap: 10 (ref 5–15)
BUN: 16 mg/dL (ref 6–20)
CO2: 24 mmol/L (ref 22–32)
Calcium: 8.9 mg/dL (ref 8.9–10.3)
Chloride: 104 mmol/L (ref 101–111)
Creatinine, Ser: 0.73 mg/dL (ref 0.61–1.24)
GFR calc Af Amer: >60 mL/min (ref >60)
GFR calc non Af Amer: >60 mL/min (ref >60)
Glucose, Bld: 167 mg/dL — ABNORMAL HIGH (ref 65–99)
Potassium: 3.8 mmol/L (ref 3.5–5.1)
Sodium: 138 mmol/L (ref 135–145)
Total Bilirubin: 0.8 mg/dL (ref 0.3–1.2)
Total Protein: 7.2 g/dL (ref 6.5–8.1)

## 2015-02-18 MED ORDER — POLYETHYLENE GLYCOL 3350 17 G PO PACK
17.0000 g | PACK | Freq: Every day | ORAL | Status: DC
  Administered 2015-02-18 – 2015-02-20 (×3): 17 g via ORAL
  Filled 2015-02-18 (×3): qty 1

## 2015-02-18 NOTE — Progress Notes (Signed)
[facility] at [place] NAME: Evan [name]    MR#:  [id number]  DATE OF BIRTH:  Mar 08, 1956  SUBJECTIVE: Admitted for right leg cellulitis . on IV vancomycin, his leg swelling is slightly better today denies any pain. No fever.   CHIEF COMPLAINT:   Chief Complaint  Patient presents with  . Recurrent Skin Infections    REVIEW OF SYSTEMS:   ROS CONSTITUTIONAL: No fever, fatigue or weakness.  EYES: No blurred or double vision.  EARS, NOSE, AND THROAT: No tinnitus or ear pain.  RESPIRATORY: No cough, shortness of breath, wheezing or hemoptysis.  CARDIOVASCULAR: No chest pain, orthopnea, edema.  GASTROINTESTINAL: No nausea, vomiting, diarrhea or abdominal pain.  GENITOURINARY: No dysuria, hematuria.  ENDOCRINE: No polyuria, nocturia,  HEMATOLOGY: No anemia, easy bruising or bleeding SKIN: No rash or lesion. MUSCULOSKELETAL: Leg swelling, ERYTHEMA on right  leg NEUROLOGIC: No tingling, numbness, weakness.  PSYCHIATRY: No anxiety or depression.   DRUG ALLERGIES:   Allergies  Allergen Reactions  . Penicillin G Hives  . Sulfa Antibiotics Hives    VITALS:  Blood pressure 123/57, pulse 72, temperature 97.8 F (36.6 C), temperature source Oral, resp. rate 16, height 5\' 5"  (1.651 m), weight 130.817 kg (288 lb 6.4 oz), SpO2 97 %.  PHYSICAL EXAMINATION:  GENERAL:  59 y.o.-year-old patient lying in the bed with no acute distress.  EYES: Pupils equal, round, reactive to light and accommodation. No scleral icterus. Extraocular muscles intact.  HEENT: Head atraumatic, normocephalic. Oropharynx and nasopharynx clear.  NECK:  Supple, no jugular venous distention. No thyroid enlargement, no tenderness.  LUNGS: Normal breath sounds bilaterally, no wheezing, rales,rhonchi or crepitation. No use of accessory muscles of respiration.  CARDIOVASCULAR: S1, S2 normal. No murmurs, rubs, or gallops.  ABDOMEN: Soft, nontender, nondistended. Bowel sounds  present. No organomegaly or mass.  EXTREMITIES: Right leg is swollen and red  NEUROLOGIC: Cranial nerves II through XII are intact. Muscle strength 5/5 in all extremities. Sensation intact. Gait not checked.  PSYCHIATRIC: The patient is alert and oriented x 3.  SKIN: No obvious rash, lesion, or ulcer.    LABORATORY PANEL:   CBC  Recent Labs Lab 02/17/15 1937  WBC 15.6*  HGB 14.0  HCT 43.0  PLT 232   ------------------------------------------------------------------------------------------------------------------  Chemistries   Recent Labs Lab 02/18/15 0537  NA 138  K 3.8  CL 104  CO2 24  GLUCOSE 167*  BUN 16  CREATININE 0.73  CALCIUM 8.9  AST 21  ALT 24  ALKPHOS 61  BILITOT 0.8   ------------------------------------------------------------------------------------------------------------------  Cardiac Enzymes No results for input(s): TROPONINI in the last 168 hours. ------------------------------------------------------------------------------------------------------------------  RADIOLOGY:  Dg Chest Portable 1 View  02/17/2015   CLINICAL DATA:  Orthopnea. Peripheral edema. Coronary artery disease. Previous myocardial infarct.  EXAM: PORTABLE CHEST - 1 VIEW  COMPARISON:  08/27/2014  FINDINGS: Mild cardiomegaly stable. Both lungs are clear. No evidence of pleural effusion. Prior CABG noted.  IMPRESSION: Stable mild cardiomegaly.  No active lung disease.   Electronically Signed   By: Earle Gell M.D.   On: 02/17/2015 20:06    EKG:   Orders placed or performed during the hospital encounter of 02/17/15  . ED EKG  . ED EKG    ASSESSMENT AND PLAN:   1: Right leg cellulitis. Continue IV vancomycin and follow clinical course. WBC is still up. Will outpatient clindamycin therapy.  2, diabetes mellitus uncomplicated ,check hemoglobin A1c, on a medication at home, continue sudden  prescription coverage. I  #3. GERD:continue PPIs. History of chronic diastolic heart  failure continue Lasix,   With history of CAD status post CABG continue aspirin beta blockers statins History of COPD stable continue nebulizers and the pro-air.   All the records are reviewed and case discussed with Care Management/Social Workerr. Management plans discussed with the patient, family and they are in agreement.  CODE STATUS:full  TOTAL TIME TAKING CARE OF THIS PATIENT: 35  minutes.   POSSIBLE D/C IN 2-3 DAYS, DEPENDING ON CLINICAL CONDITION.   Epifanio Lesches M.D on 02/18/2015 at 12:49 PM  Between 7am to 6pm - Pager - [phone]  After 6pm go to [email or web address] - password [email or web address] [facility]  Office  [phone]  CC: Primary care physician; Joanie Coddington, MD

## 2015-02-19 LAB — CBC
HCT: 43 % (ref 40.0–52.0)
Hemoglobin: 13.6 g/dL (ref 13.0–18.0)
MCH: 27.4 pg (ref 26.0–34.0)
MCHC: 31.7 g/dL — ABNORMAL LOW (ref 32.0–36.0)
MCV: 86.6 fL (ref 80.0–100.0)
Platelets: 231 10*3/uL (ref 150–440)
RBC: 4.97 MIL/uL (ref 4.40–5.90)
RDW: 16.3 % — ABNORMAL HIGH (ref 11.5–14.5)
WBC: 11.8 10*3/uL — ABNORMAL HIGH (ref 3.8–10.6)

## 2015-02-19 LAB — GLUCOSE, CAPILLARY
Glucose-Capillary: 105 mg/dL — ABNORMAL HIGH (ref 65–99)
Glucose-Capillary: 191 mg/dL — ABNORMAL HIGH (ref 65–99)
Glucose-Capillary: 207 mg/dL — ABNORMAL HIGH (ref 65–99)
Glucose-Capillary: 115 mg/dL — ABNORMAL HIGH (ref 65–99)
Glucose-Capillary: 145 mg/dL — ABNORMAL HIGH (ref 65–99)

## 2015-02-19 LAB — VANCOMYCIN, TROUGH: Vancomycin Tr: 17 ug/mL (ref 10–20)

## 2015-02-19 MED ORDER — METFORMIN HCL 500 MG PO TABS
500.0000 mg | ORAL_TABLET | Freq: Two times a day (BID) | ORAL | Status: DC
  Administered 2015-02-19 – 2015-02-20 (×2): 500 mg via ORAL
  Filled 2015-02-19 (×2): qty 1

## 2015-02-19 MED ORDER — VANCOMYCIN HCL IN DEXTROSE 1-5 GM/200ML-% IV SOLN
1000.0000 mg | Freq: Two times a day (BID) | INTRAVENOUS | Status: DC
  Administered 2015-02-20: 1000 mg via INTRAVENOUS
  Filled 2015-02-19 (×4): qty 200

## 2015-02-19 NOTE — Progress Notes (Signed)
[facility] at [place] NAME: Evan [name]    MR#:  [id number]  DATE OF BIRTH:  1955/11/20  SUBJECTIVE: Admitted for right leg cellulitis . on IV vancomycin, to better today decreased leg swelling. WBC decreased to 11.8.   CHIEF COMPLAINT:   Chief Complaint  Patient presents with  . Recurrent Skin Infections    REVIEW OF SYSTEMS:   ROS CONSTITUTIONAL: No fever, fatigue or weakness.  EYES: No blurred or double vision.  EARS, NOSE, AND THROAT: No tinnitus or ear pain.  RESPIRATORY: No cough, shortness of breath, wheezing or hemoptysis.  CARDIOVASCULAR: No chest pain, orthopnea, edema.  GASTROINTESTINAL: No nausea, vomiting, diarrhea or abdominal pain.  GENITOURINARY: No dysuria, hematuria.  ENDOCRINE: No polyuria, nocturia,  HEMATOLOGY: No anemia, easy bruising or bleeding SKIN: No rash or lesion. MUSCULOSKELETAL: Leg swelling, ERYTHEMA on right  Leg, better than yesterday. indurated. NEUROLOGIC: No tingling, numbness, weakness.  PSYCHIATRY: No anxiety or depression.   DRUG ALLERGIES:   Allergies  Allergen Reactions  . Penicillin G Hives  . Sulfa Antibiotics Hives    VITALS:  Blood pressure 140/76, pulse 69, temperature 98 F (36.7 C), temperature source Oral, resp. rate 18, height 5\' 5"  (1.651 m), weight 130.817 kg (288 lb 6.4 oz), SpO2 98 %.  PHYSICAL EXAMINATION:  GENERAL:  59 y.o.-year-old patient lying in the bed with no acute distress.  EYES: Pupils equal, round, reactive to light and accommodation. No scleral icterus. Extraocular muscles intact.  HEENT: Head atraumatic, normocephalic. Oropharynx and nasopharynx clear.  NECK:  Supple, no jugular venous distention. No thyroid enlargement, no tenderness.  LUNGS: Normal breath sounds bilaterally, no wheezing, rales,rhonchi or crepitation. No use of accessory muscles of respiration.  CARDIOVASCULAR: S1, S2 normal. No murmurs, rubs, or gallops.  ABDOMEN: Soft, nontender,  nondistended. Bowel sounds present. No organomegaly or mass.  EXTREMITIES: Right leg is swollen and red  NEUROLOGIC: Cranial nerves II through XII are intact. Muscle strength 5/5 in all extremities. Sensation intact. Gait not checked.  PSYCHIATRIC: The patient is alert and oriented x 3.  SKIN: No obvious rash, lesion, or ulcer.    LABORATORY PANEL:   CBC  Recent Labs Lab 02/19/15 0523  WBC 11.8*  HGB 13.6  HCT 43.0  PLT 231   ------------------------------------------------------------------------------------------------------------------  Chemistries   Recent Labs Lab 02/18/15 0537  NA 138  K 3.8  CL 104  CO2 24  GLUCOSE 167*  BUN 16  CREATININE 0.73  CALCIUM 8.9  AST 21  ALT 24  ALKPHOS 61  BILITOT 0.8   ------------------------------------------------------------------------------------------------------------------  Cardiac Enzymes No results for input(s): TROPONINI in the last 168 hours. ------------------------------------------------------------------------------------------------------------------  RADIOLOGY:  Dg Chest Portable 1 View  02/17/2015   CLINICAL DATA:  Orthopnea. Peripheral edema. Coronary artery disease. Previous myocardial infarct.  EXAM: PORTABLE CHEST - 1 VIEW  COMPARISON:  08/27/2014  FINDINGS: Mild cardiomegaly stable. Both lungs are clear. No evidence of pleural effusion. Prior CABG noted.  IMPRESSION: Stable mild cardiomegaly.  No active lung disease.   Electronically Signed   By: Earle Gell M.D.   On: 02/17/2015 20:06    EKG:   Orders placed or performed during the hospital encounter of 02/17/15  . ED EKG  . ED EKG    ASSESSMENT AND PLAN:   1: Right leg cellulitis. Continue IV vancomycin and follow clinical course.failed out pt therapy. Continue IV vancomycin for another 24 hours. Wound care consult for the right leg the chronic venous stasis.  2, diabetes mellitus uncomplicated ,check hemoglobin A1c, on a medication at home,  and metformin. #3. GERD:continue PPIs. History of chronic diastolic heart failure continue Lasix,   With history of CAD status post CABG continue aspirin beta blockers statins History of COPD stable continue nebulizers and the pro-air.   All the records are reviewed and case discussed with Care Management/Social Workerr. Management plans discussed with the patient, family and they are in agreement.  CODE STATUS:full  TOTAL TIME TAKING CARE OF THIS PATIENT: 35  minutes.   POSSIBLE D/C IN 2-3 DAYS, DEPENDING ON CLINICAL CONDITION.   Epifanio Lesches M.D on 02/19/2015 at 12:30 PM  Between 7am to 6pm - Pager - [phone]  After 6pm go to [email or web address] - password [email or web address] [facility]  Office  [phone]  CC: Primary care physician; Joanie Coddington, MD

## 2015-02-19 NOTE — Care Management Important Message (Signed)
Important Message  Patient Details  Name: Evan [name] MRN: [id number] Date of Birth: [birth year]   Medicare Important Message Given:  Yes-second notification given    [name] 02/19/2015, 10:18 AM

## 2015-02-19 NOTE — Progress Notes (Signed)
ANTIBIOTIC CONSULT NOTE - INITIAL  Pharmacy Consult for Vancomycin Indication: Cellulitis  Allergies  Allergen Reactions  . Penicillin G Hives  . Sulfa Antibiotics Hives    Patient Measurements: Height: 5\' 5"  (165.1 cm) Weight: 288 lb 6.4 oz (130.817 kg) IBW/kg (Calculated) : 61.5 Adjusted Body Weight: 89.2 kg  Vital Signs: Temp: 98 F (36.7 C) (07/15 0106) Temp Source: Oral (07/15 0106) BP: 128/69 mmHg (07/15 0108) Pulse Rate: 62 (07/15 0108) Intake/Output from previous day: 07/14 0701 - 07/15 0700 In: 950 [P.O.:750; IV Piggyback:200] Out: 4550 [Urine:4550] Intake/Output from this shift: Total I/O In: 30 [P.O.:30] Out: 2675 [Urine:2675]  Labs:  Recent Labs  02/17/15 1937 02/18/15 0537  WBC 15.6*  --   HGB 14.0  --   PLT 232  --   CREATININE 0.84 0.73   Estimated Creatinine Clearance: 123.9 mL/min (by C-G formula based on Cr of 0.73). No results for input(s): VANCOTROUGH, VANCOPEAK, VANCORANDOM, GENTTROUGH, GENTPEAK, GENTRANDOM, TOBRATROUGH, TOBRAPEAK, TOBRARND, AMIKACINPEAK, AMIKACINTROU, AMIKACIN in the last 72 hours.   Microbiology: Recent Results (from the past 720 hour(s))  Blood culture (single)     Status: None (Preliminary result)   Collection Time: 02/17/15  8:06 PM  Result Value Ref Range Status   Specimen Description BLOOD LEFT HAND  Final   Special Requests BOTTLES DRAWN AEROBIC AND ANAEROBIC 2ML  Final   Culture NO GROWTH < 24 HOURS  Final   Report Status PENDING  Incomplete    Medical History: Past Medical History  Diagnosis Date  . CAD (coronary artery disease)   . Hypertension   . Hyperlipidemia   . Gastroesophageal reflux   . Obstructive sleep apnea   . History of pneumonia     Remote  . History of scarlet fever     Childhood  . Obesity   . Cellulitis   . Myocardial infarction 2009  . COPD (chronic obstructive pulmonary disease)   . H/O: GI bleed   . Anxiety disorder   . Panic disorder     Medications:  Scheduled:  .  aspirin  81 mg Oral BID  . famotidine  20 mg Oral Daily  . fluticasone  1 spray Each Nare Daily  . furosemide  40 mg Oral BID  . heparin  5,000 Units Subcutaneous 3 times per day  . hydrOXYzine  25 mg Oral QHS  . insulin aspart  0-15 Units Subcutaneous TID WC  . insulin aspart  0-5 Units Subcutaneous QHS  . metoprolol tartrate  25 mg Oral BID  . pantoprazole  40 mg Oral Daily  . polyethylene glycol  17 g Oral Daily  . simvastatin  40 mg Oral Daily  . vancomycin  1,000 mg Intravenous Q8H   Assessment: Patient being treated for cellulitis of the right leg PK:  Ke=0.102; t1/2: 6.7 hrs; VD: 62.4 L  Goal of Therapy:  Vancomycin trough level 10-15 mcg/ml  Plan:  Patient received Vancomycin 2 g IV in ED. Will start Vancomycin 1 g IV q8 hours @ 06:00. Trough level ordered to be drawn prior to the 1400 dose on 7/15.   7/15:  RN gave dose late due to loss of IV access. Trough rescheduled for 1700.  Follow up culture results  Damika Harmon G 02/19/2015,1:51 AM

## 2015-02-19 NOTE — Progress Notes (Signed)
ANTIBIOTIC CONSULT NOTE - INITIAL  Pharmacy Consult for Vancomycin Indication: Cellulitis  Allergies  Allergen Reactions  . Penicillin G Hives  . Sulfa Antibiotics Hives    Patient Measurements: Height: 5\' 5"  (165.1 cm) Weight: 288 lb 6.4 oz (130.817 kg) IBW/kg (Calculated) : 61.5 Adjusted Body Weight: 89.2 kg  Vital Signs: Temp: 98.3 F (36.8 C) (07/15 1547) Temp Source: Oral (07/15 1547) BP: 106/54 mmHg (07/15 1547) Pulse Rate: 104 (07/15 1547) Intake/Output from previous day: 07/14 0701 - 07/15 0700 In: 1010 [P.O.:810; IV Piggyback:200] Out: 5150 [Urine:5150] Intake/Output from this shift: Total I/O In: 440 [P.O.:240; IV Piggyback:200] Out: 1525 [Urine:1525]  Labs:  Recent Labs  02/17/15 1937 02/18/15 0537 02/19/15 0523  WBC 15.6*  --  11.8*  HGB 14.0  --  13.6  PLT 232  --  231  CREATININE 0.84 0.73  --    Estimated Creatinine Clearance: 123.9 mL/min (by C-G formula based on Cr of 0.73).  Recent Labs  02/19/15 1658  [place]     Microbiology: Recent Results (from the past 720 hour(s))  Blood culture (single)     Status: None (Preliminary result)   Collection Time: 02/17/15  8:06 PM  Result Value Ref Range Status   Specimen Description BLOOD LEFT HAND  Final   Special Requests BOTTLES DRAWN AEROBIC AND ANAEROBIC 2ML  Final   Culture NO GROWTH 2 DAYS  Final   Report Status PENDING  Incomplete    Medical History: Past Medical History  Diagnosis Date  . CAD (coronary artery disease)   . Hypertension   . Hyperlipidemia   . Gastroesophageal reflux   . Obstructive sleep apnea   . History of pneumonia     Remote  . History of scarlet fever     Childhood  . Obesity   . Cellulitis   . Myocardial infarction 2009  . COPD (chronic obstructive pulmonary disease)   . H/O: GI bleed   . Anxiety disorder   . Panic disorder     Medications:  Scheduled:  . aspirin  81 mg Oral BID  . famotidine  20 mg Oral Daily  . fluticasone  1 spray Each  Nare Daily  . furosemide  40 mg Oral BID  . heparin  5,000 Units Subcutaneous 3 times per day  . hydrOXYzine  25 mg Oral QHS  . insulin aspart  0-15 Units Subcutaneous TID WC  . insulin aspart  0-5 Units Subcutaneous QHS  . metFORMIN  500 mg Oral BID WC  . metoprolol tartrate  25 mg Oral BID  . pantoprazole  40 mg Oral Daily  . polyethylene glycol  17 g Oral Daily  . simvastatin  40 mg Oral Daily  . vancomycin  1,000 mg Intravenous Q8H   Assessment: Patient being treated for cellulitis of the right leg PK:  Ke=0.102; t1/2: 6.7 hrs; VD: 62.4 L  Goal of Therapy:  Vancomycin trough level 10-15 mcg/ml  Plan:  Patient received Vancomycin 2 g IV in ED. Will start Vancomycin 1 g IV q8 hours @ 06:00. Trough level ordered to be drawn prior to the 1400 dose on 7/15.   7/15:  RN gave dose late due to loss of IV access. Trough rescheduled for 1700.  7/15 ~ 17:00 Trough = 17 mcg/ml.  Will decrease dosing to Vancomycin 1g IV Q12H starting at 23:00.    Follow up culture results  Marcus [place] Pharmacist 02/19/2015,6:26 PM

## 2015-02-20 LAB — GLUCOSE, CAPILLARY: Glucose-Capillary: 108 mg/dL — ABNORMAL HIGH (ref 65–99)

## 2015-02-20 MED ORDER — CLINDAMYCIN HCL 300 MG PO CAPS: 300.0000 mg | ORAL_CAPSULE | Freq: Three times a day (TID) | ORAL | Status: AC

## 2015-02-20 MED ORDER — SACCHAROMYCES BOULARDII 250 MG PO CAPS
250.0000 mg | ORAL_CAPSULE | Freq: Two times a day (BID) | ORAL | Status: DC
  Administered 2015-02-20: 250 mg via ORAL
  Filled 2015-02-20 (×2): qty 1

## 2015-02-20 MED ORDER — LEVOFLOXACIN 500 MG PO TABS
750.0000 mg | ORAL_TABLET | Freq: Every day | ORAL | Status: DC
  Administered 2015-02-20: 750 mg via ORAL
  Filled 2015-02-20: qty 2

## 2015-02-20 MED ORDER — CLINDAMYCIN HCL 150 MG PO CAPS
300.0000 mg | ORAL_CAPSULE | Freq: Three times a day (TID) | ORAL | Status: DC
  Administered 2015-02-20: 300 mg via ORAL
  Filled 2015-02-20: qty 2

## 2015-02-20 MED ORDER — OXYCODONE HCL 5 MG PO TABS
5.0000 mg | ORAL_TABLET | Freq: Four times a day (QID) | ORAL | Status: DC | PRN
Start: 2015-02-20 — End: 2015-04-01

## 2015-02-20 MED ORDER — METFORMIN HCL 500 MG PO TABS: 500.0000 mg | ORAL_TABLET | Freq: Two times a day (BID) | ORAL | Status: DC

## 2015-02-20 MED ORDER — SACCHAROMYCES BOULARDII 250 MG PO CAPS: 250.0000 mg | ORAL_CAPSULE | Freq: Two times a day (BID) | ORAL | Status: AC

## 2015-02-20 MED ORDER — ACETAMINOPHEN 325 MG PO TABS: 650.0000 mg | ORAL_TABLET | Freq: Four times a day (QID) | ORAL | Status: DC | PRN

## 2015-02-20 MED ORDER — LEVOFLOXACIN 750 MG PO TABS
750.0000 mg | ORAL_TABLET | Freq: Every day | ORAL | Status: AC
Start: 2015-02-20 — End: 2015-02-26

## 2015-02-20 NOTE — Progress Notes (Signed)
D/c instructions given and pt educated. IV removed. Questions answered. Will be escorted out by staff.

## 2015-02-20 NOTE — Discharge Instructions (Signed)
*  Notify your doctor if your symptoms return and/or worsen.  *Take all medications as prescribed. *Take your antibiotics until complete even if you are feeling better.  *Notify your doctor with any questions or concerns.

## 2015-02-20 NOTE — Care Management Note (Signed)
Case Management Note  Patient Details  Name: CLAYBORN MILNES MRN: [id number] Date of Birth: [birth year]  Subjective/Objective:   Mr Kocher reports that he cannot afford to pay for his medications until Monday 02/22/15.  Dr Gloris Ham wrote scripts for 2 days of Cleocin and Levoquin to be tubed to the [facility] so that Mr Thelin will have enough antibiotics to last until he can get his prescriptions filled on 02/22/15.                 Action/Plan:   Expected Discharge Date:                  Expected Discharge Plan:     In-House Referral:     Discharge planning Services     Post Acute Care Choice:    Choice offered to:     DME Arranged:    DME Agency:     HH Arranged:    [place] Agency:     Status of Service:     Medicare Important Message Given:  Yes-second notification given Date Medicare IM Given:    Medicare IM give by:    Date Additional Medicare IM Given:    Additional Medicare Important Message give by:     If discussed at [place] of Stay Meetings, dates discussed:    Additional Comments:  [place] Semrad A, RN 02/20/2015, 12:24 PM

## 2015-02-20 NOTE — Discharge Summary (Signed)
[facility] NAME: Evan [name]    MR#:  [id number]  DATE OF BIRTH:  April 26, 1956  DATE OF ADMISSION:  02/17/2015 ADMITTING PHYSICIAN: Lytle Butte, MD  DATE OF DISCHARGE: 02/20/2015 PRIMARY CARE PHYSICIAN: WEEKS,CYNTHIA, MD    ADMISSION DIAGNOSIS:  Acute on chronic diastolic congestive heart failure [I50.33] Cellulitis of right leg [L03.115]  DISCHARGE DIAGNOSIS:  Principal Problem:   Cellulitis of right leg   SECONDARY DIAGNOSIS:   Past Medical History  Diagnosis Date  . CAD (coronary artery disease)   . Hypertension   . Hyperlipidemia   . Gastroesophageal reflux   . Obstructive sleep apnea   . History of pneumonia     Remote  . History of scarlet fever     Childhood  . Obesity   . Cellulitis   . Myocardial infarction 2009  . COPD (chronic obstructive pulmonary disease)   . H/O: GI bleed   . Anxiety disorder   . Panic disorder     HOSPITAL COURSE:   1: Right leg cellulitis. Clinically improved with IV vancomycin and  patient is feeling better, will discharge the patient with by mouth clindamycin and levofloxacin for 7 days. Provide probiotics Outpatient follow-up [facility] on Monday  2, diabetes mellitus uncomplicated patient is feeling better, on a medication at home, and metformin.   #3. GERD:continue PPIs.  History of chronic diastolic heart failure continue Lasix,  history of CAD status post CABG continue aspirin beta blockers statins History of COPD stable continue nebulizers and the pro-air.   DISCHARGE CONDITIONS:   satisfactory  CONSULTS OBTAINED:  Treatment Team:  Lytle Butte, MD   PROCEDURES none  DRUG ALLERGIES:   Allergies  Allergen Reactions  . Penicillin G Hives  . Sulfa Antibiotics Hives    DISCHARGE MEDICATIONS:   Current Discharge Medication List    START taking these medications   Details  acetaminophen (TYLENOL) 325 MG tablet Take 2 tablets (650  mg total) by mouth every 6 (six) hours as needed for mild pain (or Fever >/= 101).    clindamycin (CLEOCIN) 300 MG capsule Take 1 capsule (300 mg total) by mouth every 8 (eight) hours. Qty: 21 capsule, Refills: 0    levofloxacin (LEVAQUIN) 750 MG tablet Take 1 tablet (750 mg total) by mouth daily. Qty: 7 tablet, Refills: 0    metFORMIN (GLUCOPHAGE) 500 MG tablet Take 1 tablet (500 mg total) by mouth 2 (two) times daily with a meal. Qty: 60 tablet, Refills: 0    oxyCODONE (OXY IR/ROXICODONE) 5 MG immediate release tablet Take 1 tablet (5 mg total) by mouth every 6 (six) hours as needed for moderate pain or breakthrough pain. Qty: 20 tablet, Refills: 0    saccharomyces boulardii (FLORASTOR) 250 MG capsule Take 1 capsule (250 mg total) by mouth 2 (two) times daily. Qty: 30 capsule, Refills: 0      CONTINUE these medications which have NOT CHANGED   Details  albuterol (PROVENTIL) (2.5 MG/3ML) 0.083% nebulizer solution Take 2.5 mg by nebulization every 6 (six) hours as needed for wheezing or shortness of breath.    aspirin 81 MG EC tablet Take 81 mg by mouth 2 (two) times daily.     esomeprazole (NEXIUM) 40 MG capsule Take 40 mg by mouth daily at 12 noon.    fluticasone (FLONASE) 50 MCG/ACT nasal spray Place 1 spray into both nostrils daily.     furosemide (LASIX) 40 MG tablet Take 80  mg in the am with 40 mg in the pm.    metoprolol tartrate (LOPRESSOR) 25 MG tablet Take 25 mg by mouth 2 (two) times daily.      potassium chloride (K-DUR) 10 MEQ tablet Take 1 tablet (10 mEq total) by mouth daily. Qty: 30 tablet, Refills: 6    PROAIR HFA 108 (90 BASE) MCG/ACT inhaler Inhale 1 puff into the lungs every 4 (four) hours as needed.     ranitidine (ZANTAC) 150 MG capsule Take 1 capsule (150 mg total) by mouth 2 (two) times daily. Qty: 180 capsule, Refills: 1    simvastatin (ZOCOR) 40 MG tablet Take 1 tablet (40 mg total) by mouth daily. Qty: 30 tablet, Refills: 3    hydrOXYzine  (ATARAX/VISTARIL) 25 MG tablet Take 25 mg by mouth at bedtime.    polyethylene glycol powder (GLYCOLAX/MIRALAX) powder Take 1 Container by mouth daily.          DISCHARGE INSTRUCTIONS:   Follow-up with primary care physician at [facility] clinic on Monday, July 18   DIET:  Cardiac diet, diabetic diet  DISCHARGE CONDITION:  Stable  ACTIVITY:  Activity as tolerated  OXYGEN:  Home Oxygen: No.   Oxygen Delivery: room air  DISCHARGE LOCATION:  home   If you experience worsening of your admission symptoms, develop shortness of breath, life threatening emergency, suicidal or homicidal thoughts you must seek medical attention immediately by calling 911 or calling your MD immediately  if symptoms less severe.  You Must read complete instructions/literature along with all the possible adverse reactions/side effects for all the Medicines you take and that have been prescribed to you. Take any new Medicines after you have completely understood and accpet all the possible adverse reactions/side effects.   Please note  You were cared for by a hospitalist during your hospital stay. If you have any questions about your discharge medications or the care you received while you were in the hospital after you are discharged, you can call the unit and asked to speak with the hospitalist on call if the hospitalist that took care of you is not available. Once you are discharged, your primary care physician will handle any further medical issues. Please note that NO REFILLS for any discharge medications will be authorized once you are discharged, as it is imperative that you return to your primary care physician (or establish a relationship with a primary care physician if you do not have one) for your aftercare needs so that they can reassess your need for medications and monitor your lab values.     Today  Chief Complaint  Patient presents with  . Recurrent Skin Infections   Patient is resting  comfortably, right lower extremity redness, swelling and pain are significantly improved Wants to go home  ROS: None CONSTITUTIONAL: Denies fevers, chills. Denies any fatigue, weakness.  EYES: Denies blurry vision, double vision, eye pain. EARS, NOSE, THROAT: Denies tinnitus, ear pain, hearing loss. RESPIRATORY: Denies cough, wheeze, shortness of breath.  CARDIOVASCULAR: Denies chest pain, palpitations, edema.  GASTROINTESTINAL: Denies nausea, vomiting, diarrhea, abdominal pain. Denies bright red blood per rectum. GENITOURINARY: Denies dysuria, hematuria. ENDOCRINE: Denies nocturia or thyroid problems. HEMATOLOGIC AND LYMPHATIC: Denies easy bruising or bleeding. SKIN: Denies rash or lesion. MUSCULOSKELETAL: Denies pain in neck, back, shoulder, knees, hips or arthritic symptoms.  NEUROLOGIC: Denies paralysis, paresthesias.  PSYCHIATRIC: Denies anxiety or depressive symptoms.   VITAL SIGNS:  Blood pressure 116/60, pulse 69, temperature 98.4 F (36.9 C), temperature source Oral, resp. rate  17, height 5\' 5"  (1.651 m), weight 130.817 kg (288 lb 6.4 oz), SpO2 97 %.  I/O:   Intake/Output Summary (Last 24 hours) at 02/20/15 1236 Last data filed at 02/20/15 0758  Gross per 24 hour  Intake    600 ml  Output   2100 ml  Net  -1500 ml    PHYSICAL EXAMINATION:  GENERAL:  59 y.o.-year-old patient lying in the bed with no acute distress.  EYES: Pupils equal, round, reactive to light and accommodation. No scleral icterus. Extraocular muscles intact.  HEENT: Head atraumatic, normocephalic. Oropharynx and nasopharynx clear.  NECK:  Supple, no jugular venous distention. No thyroid enlargement, no tenderness.  LUNGS: Normal breath sounds bilaterally, no wheezing, rales,rhonchi or crepitation. No use of accessory muscles of respiration.  CARDIOVASCULAR: S1, S2 normal. No murmurs, rubs, or gallops.  ABDOMEN: Soft, non-tender, non-distended. Bowel sounds present. No organomegaly or mass.   EXTREMITIES: No pedal edema, cyanosis, or clubbing. Right lower extremity with decreased erythema, edema. Chronic venous stasis NEUROLOGIC: Cranial nerves II through XII are intact. Muscle strength 5/5 in all extremities. Sensation intact. Gait not checked.  PSYCHIATRIC: The patient is alert and oriented x 3.  SKIN: No obvious rash, lesion, or ulcer.   DATA REVIEW:   CBC  Recent Labs Lab 02/19/15 0523  WBC 11.8*  HGB 13.6  HCT 43.0  PLT 231    Chemistries   Recent Labs Lab 02/18/15 0537  NA 138  K 3.8  CL 104  CO2 24  GLUCOSE 167*  BUN 16  CREATININE 0.73  CALCIUM 8.9  AST 21  ALT 24  ALKPHOS 61  BILITOT 0.8    Cardiac Enzymes No results for input(s): TROPONINI in the last 168 hours.  Microbiology Results  Results for orders placed or performed during the hospital encounter of 02/17/15  Blood culture (single)     Status: None (Preliminary result)   Collection Time: 02/17/15  8:06 PM  Result Value Ref Range Status   Specimen Description BLOOD LEFT HAND  Final   Special Requests BOTTLES DRAWN AEROBIC AND ANAEROBIC 2ML  Final   Culture NO GROWTH 3 DAYS  Final   Report Status PENDING  Incomplete    RADIOLOGY:  Dg Chest Portable 1 View  02/17/2015   CLINICAL DATA:  Orthopnea. Peripheral edema. Coronary artery disease. Previous myocardial infarct.  EXAM: PORTABLE CHEST - 1 VIEW  COMPARISON:  08/27/2014  FINDINGS: Mild cardiomegaly stable. Both lungs are clear. No evidence of pleural effusion. Prior CABG noted.  IMPRESSION: Stable mild cardiomegaly.  No active lung disease.   Electronically Signed   By: Earle Gell M.D.   On: 02/17/2015 20:06    EKG:   Orders placed or performed during the hospital encounter of 02/17/15  . ED EKG  . ED EKG      Management plans discussed with the patient, family and they are in agreement.  CODE STATUS:     Code Status Orders        Start     Ordered   02/17/15 2044  Full code   Continuous     02/17/15 2044       TOTAL TIME TAKING CARE OF THIS PATIENT: 45 minutes.    @[facility] @  on 02/20/2015 at 12:36 PM  Between 7am to 6pm - Pager - [phone]  After 6pm go to [email or web address] - password [email or web address] [facility]  Office  [phone]  CC: Primary care physician; Joanie Coddington, MD

## 2015-02-22 LAB — CULTURE, BLOOD (SINGLE): Culture: NO GROWTH

## 2015-03-15 ENCOUNTER — Ambulatory Visit: Payer: Medicare Other | Admitting: Cardiovascular Disease

## 2015-03-23 ENCOUNTER — Encounter: Payer: Self-pay | Admitting: Urgent Care

## 2015-03-23 ENCOUNTER — Emergency Department
Admission: EM | Admit: 2015-03-23 | Discharge: 2015-03-23 | Discharge status: Against Medical Advice | Payer: Medicare Other | Attending: Emergency Medicine | Admitting: Emergency Medicine

## 2015-03-23 DIAGNOSIS — L03115 Cellulitis of right lower limb: Secondary | ICD-10-CM | POA: Diagnosis not present

## 2015-03-23 DIAGNOSIS — R21 Rash and other nonspecific skin eruption: Secondary | ICD-10-CM | POA: Diagnosis present

## 2015-03-23 DIAGNOSIS — I10 Essential (primary) hypertension: Secondary | ICD-10-CM | POA: Insufficient documentation

## 2015-03-23 NOTE — ED Notes (Signed)
Patient presents with rash to BUE that began x 1 week ago. Patient with recent cellulitis to RLE; unna boot in place - patient reporting that he cannot tolerate boot any longer; small blisters noted to RIGHT inner ankle per patient's report.

## 2015-03-23 NOTE — ED Notes (Signed)
Patient reporting that he is feeling better and is leaving at this time. Patient advised to remain in the ED to be seen, however he is reporting that he has an appoint at [facility] on Thursday and he will call us or come back if he feels as if he is getting worse.

## 2015-03-31 ENCOUNTER — Emergency Department
Admission: EM | Admit: 2015-03-31 | Discharge: 2015-03-31 | Discharge status: Against Medical Advice | Payer: Medicare Other | Attending: Emergency Medicine | Admitting: Emergency Medicine

## 2015-03-31 ENCOUNTER — Encounter: Payer: Self-pay | Admitting: Emergency Medicine

## 2015-03-31 DIAGNOSIS — H578 Other specified disorders of eye and adnexa: Secondary | ICD-10-CM | POA: Insufficient documentation

## 2015-03-31 DIAGNOSIS — L509 Urticaria, unspecified: Secondary | ICD-10-CM | POA: Insufficient documentation

## 2015-03-31 DIAGNOSIS — I10 Essential (primary) hypertension: Secondary | ICD-10-CM | POA: Diagnosis not present

## 2015-03-31 NOTE — ED Notes (Signed)
Pt presents to ED with c/o eye drainage this morning when he woke up and dry lips that were sticking together. Pt also reports he has hives or sores on his arms and his back.

## 2015-03-31 NOTE — ED Notes (Signed)
Patient to the desk advising that he has an appointment with his PCP in the am. Patient has spoken with cardiologist as well. Patient advising that he feels as if "everything checked out" and that he was going to wait to see his PCP tomorrow. Patient educated on s/s that would necessitate him calling 911 or returning to the ED tonight. Patient verbalized understanding and consent.

## 2015-04-01 ENCOUNTER — Emergency Department: Payer: Medicare Other

## 2015-04-01 ENCOUNTER — Encounter: Payer: Self-pay | Admitting: Emergency Medicine

## 2015-04-01 ENCOUNTER — Observation Stay
Admission: EM | Admit: 2015-04-01 | Discharge: 2015-04-02 | Disposition: A | Discharge status: Home / Self Care | Payer: Medicare Other | Attending: Internal Medicine | Admitting: Internal Medicine

## 2015-04-01 DIAGNOSIS — Z951 Presence of aortocoronary bypass graft: Secondary | ICD-10-CM | POA: Insufficient documentation

## 2015-04-01 DIAGNOSIS — I10 Essential (primary) hypertension: Secondary | ICD-10-CM | POA: Diagnosis not present

## 2015-04-01 DIAGNOSIS — Z79891 Long term (current) use of opiate analgesic: Secondary | ICD-10-CM | POA: Diagnosis not present

## 2015-04-01 DIAGNOSIS — Z955 Presence of coronary angioplasty implant and graft: Secondary | ICD-10-CM | POA: Diagnosis not present

## 2015-04-01 DIAGNOSIS — I5032 Chronic diastolic (congestive) heart failure: Secondary | ICD-10-CM | POA: Diagnosis not present

## 2015-04-01 DIAGNOSIS — R05 Cough: Secondary | ICD-10-CM | POA: Diagnosis not present

## 2015-04-01 DIAGNOSIS — I252 Old myocardial infarction: Secondary | ICD-10-CM | POA: Insufficient documentation

## 2015-04-01 DIAGNOSIS — R079 Chest pain, unspecified: Secondary | ICD-10-CM | POA: Diagnosis not present

## 2015-04-01 DIAGNOSIS — L03115 Cellulitis of right lower limb: Secondary | ICD-10-CM | POA: Diagnosis not present

## 2015-04-01 DIAGNOSIS — K029 Dental caries, unspecified: Secondary | ICD-10-CM | POA: Diagnosis present

## 2015-04-01 DIAGNOSIS — R6884 Jaw pain: Secondary | ICD-10-CM | POA: Insufficient documentation

## 2015-04-01 DIAGNOSIS — Z7982 Long term (current) use of aspirin: Secondary | ICD-10-CM | POA: Diagnosis not present

## 2015-04-01 DIAGNOSIS — I872 Venous insufficiency (chronic) (peripheral): Secondary | ICD-10-CM | POA: Diagnosis not present

## 2015-04-01 DIAGNOSIS — Z8489 Family history of other specified conditions: Secondary | ICD-10-CM | POA: Insufficient documentation

## 2015-04-01 DIAGNOSIS — H578 Other specified disorders of eye and adnexa: Secondary | ICD-10-CM | POA: Diagnosis not present

## 2015-04-01 DIAGNOSIS — Z79899 Other long term (current) drug therapy: Secondary | ICD-10-CM | POA: Insufficient documentation

## 2015-04-01 DIAGNOSIS — Z882 Allergy status to sulfonamides status: Secondary | ICD-10-CM | POA: Diagnosis not present

## 2015-04-01 DIAGNOSIS — Z6841 Body Mass Index (BMI) 40.0 and over, adult: Secondary | ICD-10-CM | POA: Diagnosis not present

## 2015-04-01 DIAGNOSIS — Z8249 Family history of ischemic heart disease and other diseases of the circulatory system: Secondary | ICD-10-CM | POA: Diagnosis not present

## 2015-04-01 DIAGNOSIS — E785 Hyperlipidemia, unspecified: Secondary | ICD-10-CM | POA: Diagnosis not present

## 2015-04-01 DIAGNOSIS — K047 Periapical abscess without sinus: Secondary | ICD-10-CM

## 2015-04-01 DIAGNOSIS — Z880 Allergy status to penicillin: Secondary | ICD-10-CM | POA: Diagnosis not present

## 2015-04-01 DIAGNOSIS — E119 Type 2 diabetes mellitus without complications: Secondary | ICD-10-CM | POA: Insufficient documentation

## 2015-04-01 DIAGNOSIS — J449 Chronic obstructive pulmonary disease, unspecified: Secondary | ICD-10-CM | POA: Insufficient documentation

## 2015-04-01 DIAGNOSIS — Z9842 Cataract extraction status, left eye: Secondary | ICD-10-CM | POA: Diagnosis not present

## 2015-04-01 DIAGNOSIS — Z9114 Patient's other noncompliance with medication regimen: Secondary | ICD-10-CM | POA: Diagnosis not present

## 2015-04-01 DIAGNOSIS — K219 Gastro-esophageal reflux disease without esophagitis: Secondary | ICD-10-CM | POA: Diagnosis not present

## 2015-04-01 DIAGNOSIS — K0889 Other specified disorders of teeth and supporting structures: Secondary | ICD-10-CM | POA: Diagnosis present

## 2015-04-01 DIAGNOSIS — F419 Anxiety disorder, unspecified: Secondary | ICD-10-CM | POA: Insufficient documentation

## 2015-04-01 DIAGNOSIS — I2510 Atherosclerotic heart disease of native coronary artery without angina pectoris: Secondary | ICD-10-CM | POA: Insufficient documentation

## 2015-04-01 DIAGNOSIS — Z8701 Personal history of pneumonia (recurrent): Secondary | ICD-10-CM | POA: Diagnosis not present

## 2015-04-01 DIAGNOSIS — Z7951 Long term (current) use of inhaled steroids: Secondary | ICD-10-CM | POA: Insufficient documentation

## 2015-04-01 DIAGNOSIS — G4733 Obstructive sleep apnea (adult) (pediatric): Secondary | ICD-10-CM | POA: Diagnosis not present

## 2015-04-01 LAB — BASIC METABOLIC PANEL
Anion gap: 11 (ref 5–15)
BUN: 15 mg/dL (ref 6–20)
CO2: 26 mmol/L (ref 22–32)
Calcium: 9.9 mg/dL (ref 8.9–10.3)
Chloride: 107 mmol/L (ref 101–111)
Creatinine, Ser: 1.17 mg/dL (ref 0.61–1.24)
GFR calc Af Amer: >60 mL/min (ref >60)
GFR calc non Af Amer: >60 mL/min (ref >60)
Glucose, Bld: 114 mg/dL — ABNORMAL HIGH (ref 65–99)
Potassium: 4 mmol/L (ref 3.5–5.1)
Sodium: 144 mmol/L (ref 135–145)

## 2015-04-01 LAB — TROPONIN I: Troponin I: 0.03 ng/mL (ref <0.031)

## 2015-04-01 LAB — BRAIN NATRIURETIC PEPTIDE: B Natriuretic Peptide: 282 pg/mL — ABNORMAL HIGH (ref 0.0–100.0)

## 2015-04-01 LAB — CBC
HCT: 44.1 % (ref 40.0–52.0)
Hemoglobin: 14.1 g/dL (ref 13.0–18.0)
MCH: 27.4 pg (ref 26.0–34.0)
MCHC: 31.9 g/dL — ABNORMAL LOW (ref 32.0–36.0)
MCV: 85.7 fL (ref 80.0–100.0)
Platelets: 282 10*3/uL (ref 150–440)
RBC: 5.14 MIL/uL (ref 4.40–5.90)
RDW: 16 % — ABNORMAL HIGH (ref 11.5–14.5)
WBC: 13.5 10*3/uL — ABNORMAL HIGH (ref 3.8–10.6)

## 2015-04-01 IMAGING — CR DG CHEST 1V
1 series · 1 of 1 positions shown · non-contrast
Comparison: [DATE]

CLINICAL DATA: Chest and jaw pain

EXAM:
CHEST  1 VIEW

[portable]
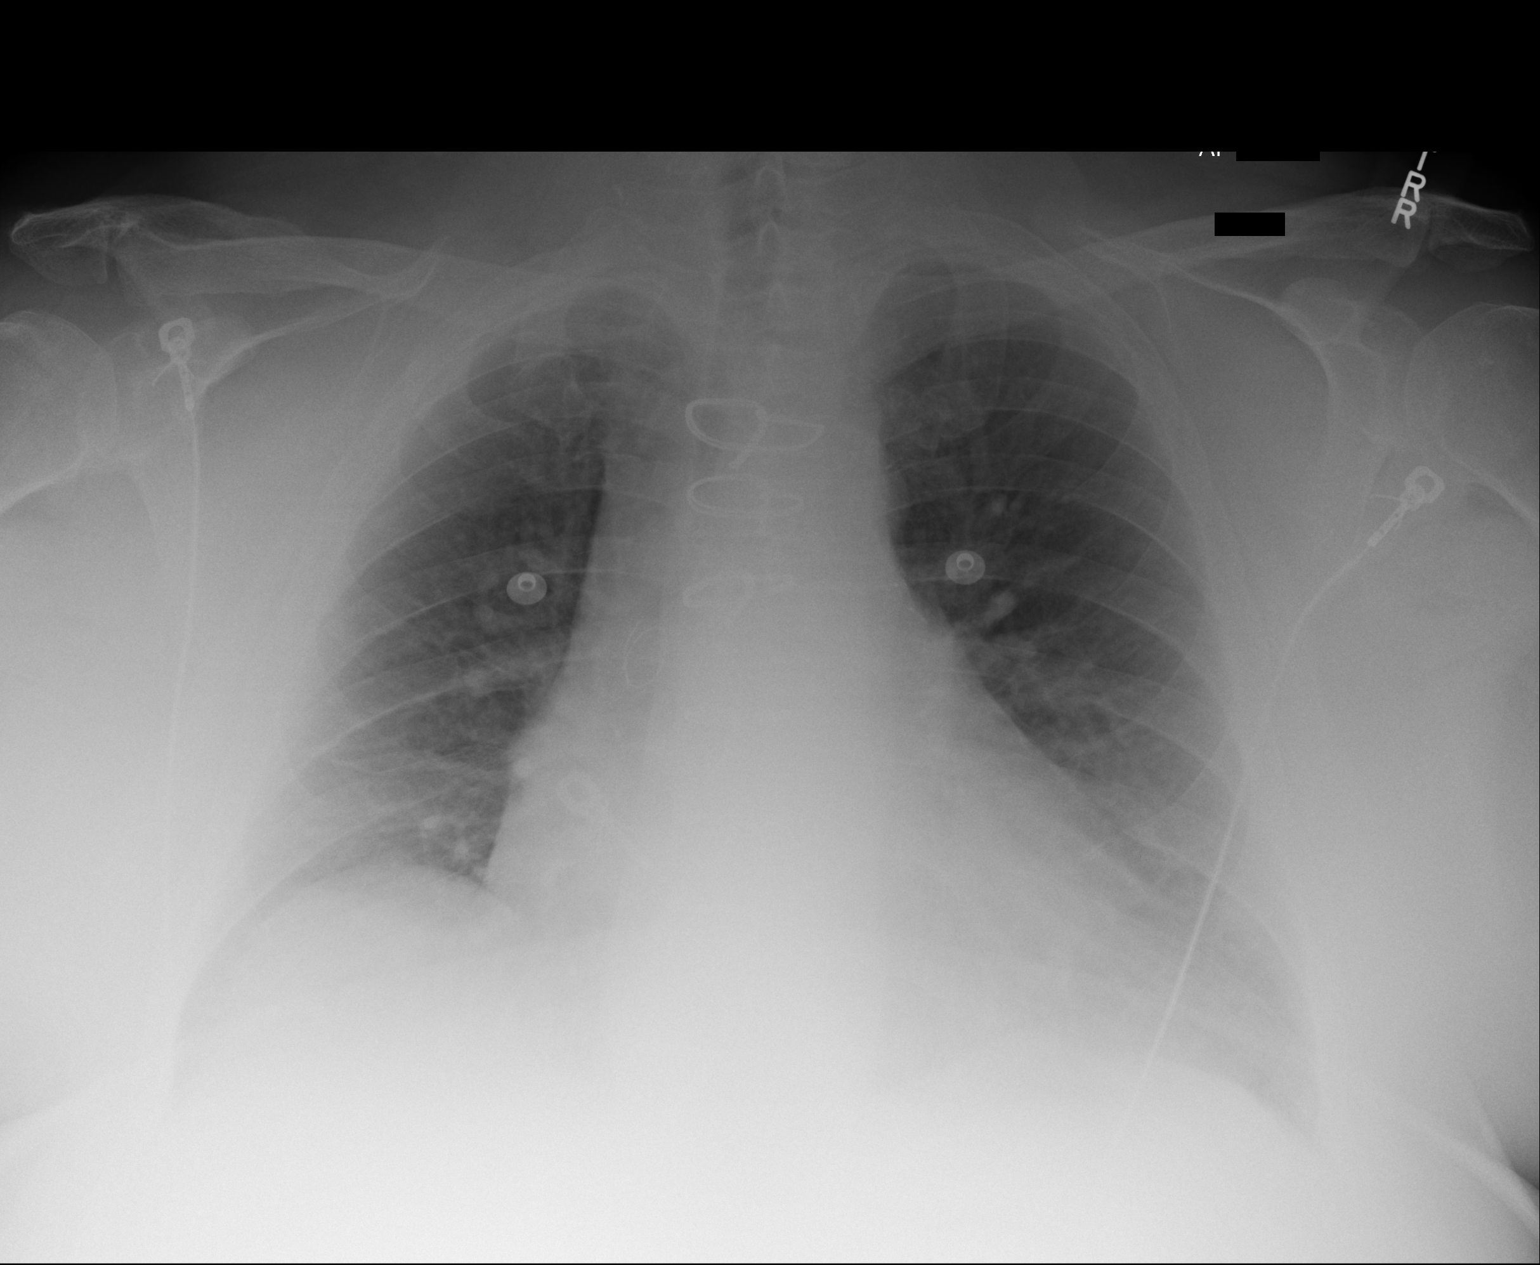

[1 of 1 positions shown; findings below may reference images not displayed]

FINDINGS: The heart size and mediastinal contours are stable. The heart size
enlarged. There is no focal infiltrate, pulmonary edema, or pleural
effusion. The visualized skeletal structures are stable.
IMPRESSION: No active cardiopulmonary disease.

## 2015-04-01 MED ORDER — ONDANSETRON HCL 4 MG PO TABS: 4.0000 mg | ORAL_TABLET | Freq: Four times a day (QID) | ORAL | Status: DC | PRN

## 2015-04-01 MED ORDER — NITROGLYCERIN 0.4 MG SL SUBL
0.4000 mg | SUBLINGUAL_TABLET | SUBLINGUAL | Status: DC | PRN
  Administered 2015-04-01 (×2): 0.4 mg via SUBLINGUAL
  Filled 2015-04-01 (×2): qty 1

## 2015-04-01 MED ORDER — CLINDAMYCIN HCL 150 MG PO CAPS
450.0000 mg | ORAL_CAPSULE | Freq: Once | ORAL | Status: AC
  Administered 2015-04-01: 450 mg via ORAL
  Filled 2015-04-01: qty 3

## 2015-04-01 MED ORDER — DOXYCYCLINE HYCLATE 100 MG PO TABS: 100.0000 mg | ORAL_TABLET | Freq: Two times a day (BID) | ORAL | Status: DC

## 2015-04-01 MED ORDER — SODIUM CHLORIDE 0.9 % IJ SOLN
3.0000 mL | Freq: Two times a day (BID) | INTRAMUSCULAR | Status: DC
  Administered 2015-04-02 (×2): 3 mL via INTRAVENOUS

## 2015-04-01 MED ORDER — ONDANSETRON HCL 4 MG/2ML IJ SOLN: 4.0000 mg | Freq: Four times a day (QID) | INTRAMUSCULAR | Status: DC | PRN

## 2015-04-01 MED ORDER — OXYCODONE HCL 5 MG PO TABS
5.0000 mg | ORAL_TABLET | ORAL | Status: DC | PRN
  Administered 2015-04-02 (×2): 5 mg via ORAL
  Filled 2015-04-01 (×2): qty 1

## 2015-04-01 MED ORDER — ACETAMINOPHEN 325 MG PO TABS: 650.0000 mg | ORAL_TABLET | Freq: Four times a day (QID) | ORAL | Status: DC | PRN

## 2015-04-01 MED ORDER — ACETAMINOPHEN 650 MG RE SUPP
650.0000 mg | Freq: Four times a day (QID) | RECTAL | Status: DC | PRN
Start: 2015-04-01 — End: 2015-04-02

## 2015-04-01 MED ORDER — MORPHINE SULFATE (PF) 2 MG/ML IV SOLN: 2.0000 mg | INTRAVENOUS | Status: DC | PRN

## 2015-04-01 MED ORDER — HEPARIN SODIUM (PORCINE) 5000 UNIT/ML IJ SOLN
5000.0000 [IU] | Freq: Three times a day (TID) | INTRAMUSCULAR | Status: DC
  Administered 2015-04-02 (×2): 5000 [IU] via SUBCUTANEOUS
  Filled 2015-04-01 (×2): qty 1

## 2015-04-01 MED ORDER — CLINDAMYCIN HCL 150 MG PO CAPS
300.0000 mg | ORAL_CAPSULE | Freq: Three times a day (TID) | ORAL | Status: DC
  Administered 2015-04-02 (×2): 300 mg via ORAL
  Filled 2015-04-01 (×3): qty 2

## 2015-04-01 MED ORDER — ASPIRIN EC 81 MG PO TBEC
81.0000 mg | DELAYED_RELEASE_TABLET | Freq: Every day | ORAL | Status: DC
  Administered 2015-04-02: 81 mg via ORAL
  Filled 2015-04-01 (×2): qty 1

## 2015-04-01 NOTE — ED Provider Notes (Signed)
[facility] Emergency Department Provider Note  ____________________________________________  Time seen: Seen upon arrival to the emergency department  I have reviewed the triage vital signs and the nursing notes.   HISTORY  Chief Complaint Chest Pain and Jaw Pain    HPI Evan [name] is a 59 y.o. male history of CAD who is presenting with chest pain over the past 4 days. He says that his pain has been intermittent and feels a pressure on the center of his chest. His that it is associated with shortness of breath and is worsened with exertion. He is also having pain that radiates to his jaw. He said that today the pain worsened and became constant. He denies any nausea, vomiting or diaphoresis. Given 4 aspirin en route. Has not had any nitroglycerin.Rates pain as May out of 10 at this time. He has a history of a CABG. Is seen at the [facility] clinic for his cardiology issues.   Past Medical History  Diagnosis Date  . CAD (coronary artery disease)   . Hypertension   . Hyperlipidemia   . Gastroesophageal reflux   . Obstructive sleep apnea   . History of pneumonia     Remote  . History of scarlet fever     Childhood  . Obesity   . Cellulitis   . Myocardial infarction 2009  . COPD (chronic obstructive pulmonary disease)   . H/O: GI bleed   . Anxiety disorder   . Panic disorder     Patient Active Problem List   Diagnosis Date Noted  . Chest pain, central 04/01/2015  . Cellulitis of right leg 02/17/2015  . Chronic venous insufficiency 09/10/2014  . Chronic diastolic CHF (congestive heart failure) 01/09/2014  . GERD (gastroesophageal reflux disease) 09/12/2013  . DOE (dyspnea on exertion) 09/12/2013  . Obesity, morbid (more than 100 lbs over ideal weight or BMI > 40) 07/26/2011  . OLD MYOCARDIAL INFARCTION 03/02/2010  . HYPERLIPIDEMIA, MIXED 03/20/2009  . HYPERTENSION, BENIGN 03/20/2009  . CORONARY ATHEROSCLEROSIS, ARTERY BYPASS GRAFT 03/20/2009     Past Surgical History  Procedure Laterality Date  . Cardiac catheterization    . Coronary angioplasty with stent placement  2002  . Coronary angioplasty with stent placement  1999  . Cataract extraction Left   . Coronary artery bypass graft      x7    Current Outpatient Rx  Name  Route  Sig  Dispense  Refill  . acetaminophen (TYLENOL) 325 MG tablet   Oral   Take 2 tablets (650 mg total) by mouth every 6 (six) hours as needed for mild pain (or Fever >/= 101).         Marland Kitchen albuterol (PROVENTIL) (2.5 MG/3ML) 0.083% nebulizer solution   Nebulization   Take 2.5 mg by nebulization every 6 (six) hours as needed for wheezing or shortness of breath.         Marland Kitchen aspirin 81 MG EC tablet   Oral   Take 81 mg by mouth 2 (two) times daily.          Marland Kitchen esomeprazole (NEXIUM) 40 MG capsule   Oral   Take 40 mg by mouth daily at 12 noon.         . fluticasone (FLONASE) 50 MCG/ACT nasal spray   Each Nare   Place 1 spray into both nostrils daily.          . furosemide (LASIX) 40 MG tablet      Take 80 mg in the  am with 40 mg in the pm.         . hydrOXYzine (ATARAX/VISTARIL) 25 MG tablet   Oral   Take 25 mg by mouth at bedtime.         . metFORMIN (GLUCOPHAGE) 500 MG tablet   Oral   Take 1 tablet (500 mg total) by mouth 2 (two) times daily with a meal.   60 tablet   0   . metoprolol tartrate (LOPRESSOR) 25 MG tablet   Oral   Take 25 mg by mouth 2 (two) times daily.           Marland Kitchen oxyCODONE (OXY IR/ROXICODONE) 5 MG immediate release tablet   Oral   Take 1 tablet (5 mg total) by mouth every 6 (six) hours as needed for moderate pain or breakthrough pain.   20 tablet   0   . polyethylene glycol powder (GLYCOLAX/MIRALAX) powder   Oral   Take 1 Container by mouth daily.          . potassium chloride (K-DUR) 10 MEQ tablet   Oral   Take 1 tablet (10 mEq total) by mouth daily.   30 tablet   6   . PROAIR HFA 108 (90 BASE) MCG/ACT inhaler   Inhalation   Inhale 1 puff  into the lungs every 4 (four) hours as needed.          . ranitidine (ZANTAC) 150 MG capsule   Oral   Take 1 capsule (150 mg total) by mouth 2 (two) times daily.   180 capsule   1   . simvastatin (ZOCOR) 40 MG tablet   Oral   Take 1 tablet (40 mg total) by mouth daily.   30 tablet   3     Allergies Penicillin g and Sulfa antibiotics  Family History  Problem Relation Age of Onset  . Coronary artery disease Other   . Heart attack Mother   . Hypertension Mother   . Hyperlipidemia Mother   . Heart attack Brother 53    MI    Social History Social History  Substance Use Topics  . Smoking status: Never Smoker   . Smokeless tobacco: Never Used  . Alcohol Use: No    Review of Systems Constitutional: No fever/chills Eyes: No visual changes. ENT: No sore throat. Cardiovascular: As above Respiratory: As above  Gastrointestinal: No abdominal pain.  No nausea, no vomiting.  No diarrhea.  No constipation. Genitourinary: Negative for dysuria. Musculoskeletal: Negative for back pain. Skin: Right lower extremity cellulitis which the patient says is improving. He says that his leg is still red but has improved since his last admission for this about a month ago.  Neurological: Negative for headaches, focal weakness or numbness.  10-point ROS otherwise negative.  ____________________________________________   PHYSICAL EXAM:  VITAL SIGNS: ED Triage Vitals  Enc Vitals Group     BP --      Pulse --      Resp --      Temp --      Temp src --      SpO2 --      Weight --      Height --      Head Cir --      Peak Flow --      Pain Score 04/01/15 2118 8     Pain Loc --      Pain Edu? --      Excl. in [place]? --  Constitutional: Alert and oriented. Well appearing and in no acute distress. Eyes: Conjunctivae are normal. PERRL. EOMI. Head: Atraumatic. Nose: No congestion/rhinnorhea. Mouth/Throat: Mucous membranes are moist.  Oropharynx non-erythematous. Neck: No  stridor.   Cardiovascular: Normal rate, regular rhythm. Grossly normal heart sounds.  Good peripheral circulation. Respiratory: Normal respiratory effort.  No retractions. Lungs CTAB. Gastrointestinal: Soft and nontender. No distention. No abdominal bruits. No CVA tenderness. Musculoskeletal: Trace edema to the bilateral lower extremities with erythema to the right lower extremity. Bilateral pulses are intact and equal to the dorsalis pedis..  No joint effusions. Neurologic:  Normal speech and language. No gross focal neurologic deficits are appreciated. No gait instability. Skin:  Skin is warm, dry and intact. No rash noted. Psychiatric: Mood and affect are normal. Speech and behavior are normal.  ____________________________________________   LABS (all labs ordered are listed, but only abnormal results are displayed)  Labs Reviewed  BASIC METABOLIC PANEL - Abnormal; Notable for the following:    Glucose, Bld 114 (*)    All other components within normal limits  CBC - Abnormal; Notable for the following:    WBC 13.5 (*)    MCHC 31.9 (*)    RDW 16.0 (*)    All other components within normal limits  BRAIN NATRIURETIC PEPTIDE - Abnormal; Notable for the following:    B Natriuretic Peptide 282.0 (*)    All other components within normal limits  TROPONIN I  CBC  CREATININE, SERUM   ____________________________________________  EKG  ED ECG REPORT I, Schaevitz,  Youlanda Roys, the attending physician, personally viewed and interpreted this ECG.   Date: 04/01/2015  EKG Time: 2125  Rate: 83  Rhythm: normal sinus rhythm  Axis: Normal axis  Intervals:none  ST&T Change: T wave inversions with 1 mm depression in lead 1 as well as V6. Also T wave inversions in V4 and V5, 2 and aVL.  Prehospital EKGs reviewed including one that read acute MI. I do not see the inferior MI that is reading as a STEMI. Also, our repeat EKG in the emergency room does not qualify for STEMI criteria.  Current EKG  is not changed from multiple previous EKGs.  ____________________________________________  RADIOLOGY  No active cardiopulmonary disease on x-ray. I personally reviewed this film. ____________________________________________   PROCEDURES   ____________________________________________   INITIAL IMPRESSION / ASSESSMENT AND PLAN / ED COURSE  Pertinent labs & imaging results that were available during my care of the patient were reviewed by me and considered in my medical decision making (see chart for details).  ----------------------------------------- 9:47 PM on 04/01/2015 -----------------------------------------  Patient says that the pain is down to a 6 out of 10 after 1 nitroglycerin tab. We'll proceed with further nitroglycerin tabs.  ----------------------------------------- 10:46 PM on 04/01/2015 -----------------------------------------  Patient now chest pain-free after second nitroglycerin but is complaining of pain to his teeth diffusely especially to the left upper teeth just lateral to midline. Patient has very poor dentition with multiple areas of erosion. We'll give dose of clindamycin. Signed out to Dr. Lavetta Nielsen for chest pain admission. Patient with borderline elevated troponin but with multiple risk factors for cardiac ACS. ____________________________________________   FINAL CLINICAL IMPRESSION(S) / ED DIAGNOSES  Acute chest pain. Dental caries. Initial visit.    Orbie Pyo, MD 04/01/15 [phone]

## 2015-04-01 NOTE — H&P (Signed)
Evan [name] NAME: Evan [name]    MR#:  [id number]  DATE OF BIRTH:  02-26-1956   DATE OF ADMISSION:  04/01/2015  PRIMARY CARE PHYSICIAN: Joanie Coddington, MD   REQUESTING/REFERRING PHYSICIAN: Schaevitz  CHIEF COMPLAINT:   Chief Complaint  Patient presents with  . Chest Pain  . Jaw Pain    HISTORY OF PRESENT ILLNESS:  Evan [name]  is a 59 y.o. male with a known history of coronary artery disease, type 2 diabetes non-insulin-requiring presenting with chest pain. The patient is a fairly poor historian however states having pain that occurred suddenly in the jaw with radiation into the chest located retrosternal. Describes chest pain as pressure in quality, 8/10 in intensity no further radiation other than mentioned, no worsening or relieving factors. No further associated symptoms including nausea, vomiting, diaphoresis. Currently chest pain-free and now is complaining of tooth pain.  PAST MEDICAL HISTORY:   Past Medical History  Diagnosis Date  . CAD (coronary artery disease)   . Hypertension   . Hyperlipidemia   . Gastroesophageal reflux   . Obstructive sleep apnea   . History of pneumonia     Remote  . History of scarlet fever     Childhood  . Obesity   . Cellulitis   . Myocardial infarction 2009  . COPD (chronic obstructive pulmonary disease)   . H/O: GI bleed   . Anxiety disorder   . Panic disorder     PAST SURGICAL HISTORY:   Past Surgical History  Procedure Laterality Date  . Cardiac catheterization    . Coronary angioplasty with stent placement  2002  . Coronary angioplasty with stent placement  1999  . Cataract extraction Left   . Coronary artery bypass graft      x7    SOCIAL HISTORY:   Social History  Substance Use Topics  . Smoking status: Never [name]   . Smokeless tobacco: Never Used  . Alcohol Use: No    FAMILY HISTORY:   Family History  Problem Relation Age of Onset  .  Coronary artery disease Other   . Heart attack Mother   . Hypertension Mother   . Hyperlipidemia Mother   . Heart attack Brother 46    MI    DRUG ALLERGIES:   Allergies  Allergen Reactions  . Penicillin G Hives  . Sulfa Antibiotics Hives    REVIEW OF SYSTEMS:  REVIEW OF SYSTEMS:  CONSTITUTIONAL: Denies fevers, chills, fatigue, weakness.  EYES: Denies blurred vision, double vision, or eye pain.  EARS, NOSE, THROAT: Denies tinnitus, ear pain, hearing loss.  RESPIRATORY: denies cough, shortness of breath, wheezing  CARDIOVASCULAR: Positive chest pain, edema.  GASTROINTESTINAL: Denies nausea, vomiting, diarrhea, abdominal pain.  GENITOURINARY: Denies dysuria, hematuria.  ENDOCRINE: Denies nocturia or thyroid problems. HEMATOLOGIC AND LYMPHATIC: Denies easy bruising or bleeding.  SKIN: Positive red rash in both legs (chronic)  MUSCULOSKELETAL: Denies pain in neck, back, shoulder, knees, hips, or further arthritic symptoms.  NEUROLOGIC: Denies paralysis, paresthesias.  PSYCHIATRIC: Denies anxiety or depressive symptoms. Otherwise full review of systems performed by me is negative.   MEDICATIONS AT HOME:   Prior to Admission medications   Medication Sig Start Date End Date Taking? Authorizing Provider  aspirin 81 MG EC tablet Take 81 mg by mouth 2 (two) times daily.     Historical Provider, MD  esomeprazole (NEXIUM) 40 MG capsule Take 40 mg by mouth daily at 12 noon.  Historical Provider, MD  fluticasone (FLONASE) 50 MCG/ACT nasal spray Place 1 spray into both nostrils daily.  12/31/13   Historical Provider, MD  furosemide (LASIX) 40 MG tablet Take 80 mg in the am with 40 mg in the pm.    Historical Provider, MD  hydrOXYzine (ATARAX/VISTARIL) 25 MG tablet Take 25 mg by mouth at bedtime.    Historical Provider, MD  metFORMIN (GLUCOPHAGE) 500 MG tablet Take 1 tablet (500 mg total) by mouth 2 (two) times daily with a meal. 02/20/15   Nicholes Mango, MD  metoprolol tartrate (LOPRESSOR)  25 MG tablet Take 25 mg by mouth 2 (two) times daily.      Historical Provider, MD  oxyCODONE (OXY IR/ROXICODONE) 5 MG immediate release tablet Take 1 tablet (5 mg total) by mouth every 6 (six) hours as needed for moderate pain or breakthrough pain. 02/20/15   Nicholes Mango, MD  polyethylene glycol powder (GLYCOLAX/MIRALAX) powder Take 1 Container by mouth daily.  10/06/13   Historical Provider, MD  potassium chloride (K-DUR) 10 MEQ tablet Take 1 tablet (10 mEq total) by mouth daily. 04/14/14   Minna Merritts, MD  PROAIR HFA 108 (90 BASE) MCG/ACT inhaler Inhale 1 puff into the lungs every 4 (four) hours as needed.  12/22/13   Historical Provider, MD      VITAL SIGNS:  Blood pressure 168/95, pulse 79, resp. rate 18, SpO2 95 %.  PHYSICAL EXAMINATION:  VITAL SIGNS: Filed Vitals:   04/01/15 2130  BP: 168/95  Pulse: 79  Resp: 18   GENERAL:59 y.o.male currently in no acute distress. Obese HEAD: Normocephalic, atraumatic.  EYES: Pupils equal, round, reactive to light. Extraocular muscles intact. No scleral icterus.  MOUTH: Moist mucosal membrane. Dentition poor. No abscess noted.  EAR, NOSE, THROAT: Clear without exudates. No external lesions.  NECK: Supple. No thyromegaly. No nodules. No JVD.  PULMONARY: Clear to ascultation, without wheeze rails or rhonci. No use of accessory muscles, Good respiratory effort. good air entry bilaterally CHEST: Nontender to palpation.  CARDIOVASCULAR: S1 and S2. Regular rate and rhythm. No murmurs, rubs, or gallops. 1+ edema. Pedal pulses 2+ bilaterally.  GASTROINTESTINAL: Soft, nontender, nondistended. No masses. Positive bowel sounds. No hepatosplenomegaly.  MUSCULOSKELETAL: No swelling, clubbing, or edema. Range of motion full in all extremities.  NEUROLOGIC: Cranial nerves II through XII are intact. No gross focal neurological deficits. Sensation intact. Reflexes intact.  SKIN: Bilateral lower extremity erythematous lesion right leg worse than left, right leg  warm to touch no further lesions, rashes, or cyanosis. Skin warm and dry. Turgor intact.  PSYCHIATRIC: Mood, affect within normal limits. The patient is awake, alert and oriented x 3. Insight, judgment intact.    LABORATORY PANEL:   CBC  Recent Labs Lab 04/01/15 2125  WBC 13.5*  HGB 14.1  HCT 44.1  PLT 282   ------------------------------------------------------------------------------------------------------------------  Chemistries   Recent Labs Lab 04/01/15 2125  NA 144  K 4.0  CL 107  CO2 26  GLUCOSE 114*  BUN 15  CREATININE 1.17  CALCIUM 9.9   ------------------------------------------------------------------------------------------------------------------  Cardiac Enzymes  Recent Labs Lab 04/01/15 2125  TROPONINI 0.03   ------------------------------------------------------------------------------------------------------------------  RADIOLOGY:  Dg Chest 1 View  04/01/2015   CLINICAL DATA:  Chest and jaw pain  EXAM: CHEST  1 VIEW  COMPARISON:  February 17, 2015  FINDINGS: The heart size and mediastinal contours are stable. The heart size enlarged. There is no focal infiltrate, pulmonary edema, or pleural effusion. The visualized skeletal structures are stable.  IMPRESSION:  No active cardiopulmonary disease.   Electronically Signed   By: Abelardo Diesel M.D.   On: 04/01/2015 21:41    EKG:   Orders placed or performed during the hospital encounter of 04/01/15  . ED EKG within 10 minutes  . ED EKG within 10 minutes  . ED EKG  . ED EKG    IMPRESSION AND PLAN:   59 year old Caucasian gentleman history of coronary artery disease as well as type 2 diabetes non-insulin-requiring presenting with chest pain.  1. Chest pain, central: Aspirin/statin therapy, trend cardiac enzymes, telemetry 2. Right lower extremity cellulitis: Receive antibiotics family emergency department continue oral antibiotics 3. Type 2 diabetes uncomplicated: Hold oral agents at insulin  sliding scale 4. Essential hypertension: Lopressor 5. Venous thromboembolism prophylactic: Heparin subcutaneous    All the records are reviewed and case discussed with ED provider. Management plans discussed with the patient, family and they are in agreement.  CODE STATUS: Full  TOTAL TIME TAKING CARE OF THIS PATIENT: 35 minutes.    Jazmon Kos,  Karenann Cai.D on 04/01/2015 at 11:16 PM  Between 7am to 6pm - Pager - [phone]  After 6pm: House Pager: - [phone]  [facility]  Office  [phone]  CC: Primary care physician; Joanie Coddington, MD

## 2015-04-01 NOTE — ED Notes (Signed)
Admitting MD at bedside.

## 2015-04-01 NOTE — ED Notes (Signed)
Patient comes into the ED via EMS c/o chest pain and jaw pain.  Patient explains it to bee a tightness and pressure that is also causing shortness of breath and dizziness.  Given asp 324 in the field by the medics.  CBG 102, 189/111, 86 HR, 97% ra.  H/o HTN, DM, MI, asthma, and cellulitis of the right leg.

## 2015-04-02 DIAGNOSIS — R079 Chest pain, unspecified: Secondary | ICD-10-CM | POA: Diagnosis not present

## 2015-04-02 DIAGNOSIS — I5032 Chronic diastolic (congestive) heart failure: Secondary | ICD-10-CM

## 2015-04-02 DIAGNOSIS — Z951 Presence of aortocoronary bypass graft: Secondary | ICD-10-CM

## 2015-04-02 DIAGNOSIS — M7989 Other specified soft tissue disorders: Secondary | ICD-10-CM

## 2015-04-02 DIAGNOSIS — K0889 Other specified disorders of teeth and supporting structures: Secondary | ICD-10-CM | POA: Diagnosis present

## 2015-04-02 DIAGNOSIS — K088 Other specified disorders of teeth and supporting structures: Secondary | ICD-10-CM | POA: Diagnosis not present

## 2015-04-02 LAB — GLUCOSE, CAPILLARY
Glucose-Capillary: 126 mg/dL — ABNORMAL HIGH (ref 65–99)
Glucose-Capillary: 135 mg/dL — ABNORMAL HIGH (ref 65–99)

## 2015-04-02 LAB — TROPONIN I
Troponin I: <0.03 ng/mL (ref <0.031)
Troponin I: <0.03 ng/mL (ref <0.031)

## 2015-04-02 MED ORDER — HYDROXYZINE HCL 25 MG PO TABS: 25.0000 mg | ORAL_TABLET | Freq: Every day | ORAL | Status: DC

## 2015-04-02 MED ORDER — SIMVASTATIN 40 MG PO TABS
40.0000 mg | ORAL_TABLET | Freq: Every day | ORAL | Status: DC
  Administered 2015-04-02: 40 mg via ORAL
  Filled 2015-04-02: qty 1

## 2015-04-02 MED ORDER — OXYCODONE HCL 5 MG PO TABS: 5.0000 mg | ORAL_TABLET | Freq: Four times a day (QID) | ORAL | Status: DC | PRN

## 2015-04-02 MED ORDER — NITROGLYCERIN 0.4 MG SL SUBL: 0.4000 mg | SUBLINGUAL_TABLET | SUBLINGUAL | Status: DC | PRN

## 2015-04-02 MED ORDER — POLYETHYLENE GLYCOL 3350 17 GM/SCOOP PO POWD: 1.0000 | Freq: Every day | ORAL | Status: DC

## 2015-04-02 MED ORDER — FUROSEMIDE 40 MG PO TABS
80.0000 mg | ORAL_TABLET | Freq: Two times a day (BID) | ORAL | Status: DC
  Administered 2015-04-02: 80 mg via ORAL
  Filled 2015-04-02: qty 2

## 2015-04-02 MED ORDER — PANTOPRAZOLE SODIUM 40 MG PO TBEC
40.0000 mg | DELAYED_RELEASE_TABLET | Freq: Every day | ORAL | Status: DC
  Administered 2015-04-02: 40 mg via ORAL
  Filled 2015-04-02: qty 1

## 2015-04-02 MED ORDER — ALBUTEROL SULFATE (2.5 MG/3ML) 0.083% IN NEBU: 2.5000 mg | INHALATION_SOLUTION | Freq: Four times a day (QID) | RESPIRATORY_TRACT | Status: DC | PRN

## 2015-04-02 MED ORDER — INSULIN ASPART 100 UNIT/ML ~~LOC~~ SOLN
0.0000 [IU] | Freq: Three times a day (TID) | SUBCUTANEOUS | Status: DC
  Filled 2015-04-02: qty 1

## 2015-04-02 MED ORDER — FAMOTIDINE 20 MG PO TABS
20.0000 mg | ORAL_TABLET | Freq: Two times a day (BID) | ORAL | Status: DC
  Administered 2015-04-02: 20 mg via ORAL
  Filled 2015-04-02: qty 1

## 2015-04-02 MED ORDER — METOPROLOL TARTRATE 25 MG PO TABS
25.0000 mg | ORAL_TABLET | Freq: Two times a day (BID) | ORAL | Status: DC
  Administered 2015-04-02: 25 mg via ORAL
  Filled 2015-04-02: qty 1

## 2015-04-02 MED ORDER — INSULIN ASPART 100 UNIT/ML ~~LOC~~ SOLN: 0.0000 [IU] | Freq: Every day | SUBCUTANEOUS | Status: DC

## 2015-04-02 MED ORDER — FLUTICASONE PROPIONATE 50 MCG/ACT NA SUSP
1.0000 | Freq: Every day | NASAL | Status: DC
  Filled 2015-04-02: qty 16

## 2015-04-02 MED ORDER — POLYETHYLENE GLYCOL 3350 17 G PO PACK
17.0000 g | PACK | Freq: Every day | ORAL | Status: DC
  Administered 2015-04-02: 17 g via ORAL
  Filled 2015-04-02: qty 1

## 2015-04-02 NOTE — Discharge Instructions (Addendum)
Heart Failure Clinic appointment on April 13, 2015 at 9:30am with [name], [place]. Please call [phone] to reschedule.      DIET:  Cardiac diet  DISCHARGE CONDITION:  Stable  ACTIVITY:  Activity as tolerated  OXYGEN:  Home Oxygen: No.   Oxygen Delivery: room air  DISCHARGE LOCATION:  home   If you experience worsening of your admission symptoms, develop shortness of breath, life threatening emergency, suicidal or homicidal thoughts you must seek medical attention immediately by calling 911 or calling your MD immediately  if symptoms less severe.  You Must read complete instructions/literature along with all the possible adverse reactions/side effects for all the Medicines you take and that have been prescribed to you. Take any new Medicines after you have completely understood and accpet all the possible adverse reactions/side effects.   Please note  You were cared for by a hospitalist during your hospital stay. If you have any questions about your discharge medications or the care you received while you were in the hospital after you are discharged, you can call the unit and asked to speak with the hospitalist on call if the hospitalist that took care of you is not available. Once you are discharged, your primary care physician will handle any further medical issues. Please note that NO REFILLS for any discharge medications will be authorized once you are discharged, as it is imperative that you return to your primary care physician (or establish a relationship with a primary care physician if you do not have one) for your aftercare needs so that they can reassess your need for medications and monitor your lab values.

## 2015-04-02 NOTE — Progress Notes (Signed)
Patient had been seen previously in the outpatient [facility] but not since 2015. Appointment was made for him to get reestablished on April 13, 2015 at 9:30am. Thank you.

## 2015-04-02 NOTE — Consult Note (Signed)
Cardiology Consultation Note  Patient ID: Evan [name], MRN: [id number], DOB/AGE: 60-21-1957 60 y.o. Admit date: 04/01/2015   Date of Consult: 04/02/2015 Primary Physician: Joanie Coddington, MD Primary Cardiologist: Dr. [name], MD  Chief Complaint: Chest pressure and tooth pain Reason for Consult: Chest pressure  HPI: 59 y.o. male with h/o CAD s/p 6 vessel CABG in 6063, chronic diastolic CHF, morbid obesity, venous insufficiency on right side, OSA on CPAP, OCD, anxiety, panic disorder, medication noncompliance, and GERD who presents to [facility] ED on 8/16 for evaluation of rash, but left AMA, again on 8/24 with eye discharge and left AMA, and for a third time on 8/25 with tooth pain and chest pressure, finally staying to be admitted. Cardiology was consulted for further evaluation.   Patient with significant cardiac history dating back into the 1990's when he states he had his first MI. He reports his symptoms as left sided chest pain that radiated to his neck, shoulder, and arm at that time. He went on to have multiple stents (4) placed in the early 2000's. After that time he just did not feel well for quite a while. Noted he was tired all of the time and had a cough productive of green sputum. At times he would "fall out." He was not having any chest pain. He ultimately had a cath in 2009 which revealed the need for 6 vessel bypass. Surgical anatomy is as follows: LIMA to the LAD, vein graft to the diagonal, vein graft to the OM, vein graft to the ramus, vein graft to OM 2 and vein graft to the distal RCA. No further ischemic evaluations since then. He was advised to have nuclear stress testing in 09/2013, though he never followed up on this.   He was admitted to [facility] in 08/2014 for RLE swelling, a chronic issue. Doppler ultrasound was negative for DVT and CT of chest was negative for PE, with gallstones were noted. In follow up he noted his LEE was worse than baseline, though eh was not wearing his  compression hose and was sitting with his legs hanging down.   He presented to [facility] on 8/25 with complaints of left upper tooth pain, worse when he tries to eat food or drink hot and cold drinks. At times he does have intermittent chest pressure, though this is not associated with exertion and not worsening. No associated nausea, vomiting, diaphoresis, presyncope, or syncope. He reports his main concern is his tooth pain and rash. He is concerned for possible bacterial etiology of his rash.   Upon his arrival to [facility] he was found to have negative troponin x 2, BNP 282, SCr 1.17, WBC 13.5, CXR with no acitve disease, ECG NSR, 83 bpm, nonspecific intraventricular conduction delay, inferolateral st/t changes. He is currently without any chest pressure, though still with rash and tooth pain that was worse when he ate his breakfast.     Past Medical History  Diagnosis Date  . CAD (coronary artery disease)   . Hypertension   . Hyperlipidemia   . Gastroesophageal reflux   . Obstructive sleep apnea   . History of pneumonia     Remote  . History of scarlet fever     Childhood  . Obesity   . Cellulitis   . Myocardial infarction 2009  . COPD (chronic obstructive pulmonary disease)   . H/O: GI bleed   . Anxiety disorder   . Panic disorder       Most Recent Cardiac Studies: As above  Surgical History:  Past Surgical History  Procedure Laterality Date  . Cardiac catheterization    . Coronary angioplasty with stent placement  2002  . Coronary angioplasty with stent placement  1999  . Cataract extraction Left   . Coronary artery bypass graft      x7     Home Meds: Prior to Admission medications   Medication Sig Start Date End Date Taking? Authorizing Provider  aspirin 81 MG EC tablet Take 81 mg by mouth 2 (two) times daily.    Yes Historical Provider, MD  esomeprazole (NEXIUM) 40 MG capsule Take 40 mg by mouth daily.    Yes Historical Provider, MD  fluticasone (FLONASE) 50 MCG/ACT  nasal spray Place 2 sprays into both nostrils daily.  12/31/13  Yes Historical Provider, MD  furosemide (LASIX) 40 MG tablet Take 80 mg in the am with 40 mg in the pm.   Yes Historical Provider, MD  hydrOXYzine (ATARAX/VISTARIL) 25 MG tablet Take 25 mg by mouth at bedtime.   Yes Historical Provider, MD  metFORMIN (GLUCOPHAGE) 500 MG tablet Take 1 tablet (500 mg total) by mouth 2 (two) times daily with a meal. 02/20/15  Yes Nicholes Mango, MD  metoprolol tartrate (LOPRESSOR) 25 MG tablet Take 25 mg by mouth 2 (two) times daily.     Yes Historical Provider, MD  potassium chloride (K-DUR) 10 MEQ tablet Take 1 tablet (10 mEq total) by mouth daily. 04/14/14  Yes Minna Merritts, MD  PROAIR HFA 108 (90 BASE) MCG/ACT inhaler Inhale 1 puff into the lungs every 4 (four) hours as needed.  12/22/13  Yes Historical Provider, MD  rosuvastatin (CRESTOR) 20 MG tablet Take 20 mg by mouth daily.   Yes Historical Provider, MD  polyethylene glycol powder (GLYCOLAX/MIRALAX) powder Take 1 Container by mouth daily.  10/06/13   Historical Provider, MD    Inpatient Medications:  . aspirin EC  81 mg Oral Daily  . clindamycin  300 mg Oral 3 times per day  . famotidine  20 mg Oral BID  . fluticasone  1 spray Each Nare Daily  . furosemide  80 mg Oral BID  . heparin  5,000 Units Subcutaneous 3 times per day  . hydrOXYzine  25 mg Oral QHS  . insulin aspart  0-5 Units Subcutaneous QHS  . insulin aspart  0-9 Units Subcutaneous TID WC  . metoprolol tartrate  25 mg Oral BID  . pantoprazole  40 mg Oral Daily  . polyethylene glycol  17 g Oral Daily  . simvastatin  40 mg Oral Daily  . sodium chloride  3 mL Intravenous Q12H      Allergies:  Allergies  Allergen Reactions  . Penicillin G Hives  . Sulfa Antibiotics Hives    Social History   Social History  . Marital Status: Single    Spouse Name: N/A  . Number of Children: N/A  . Years of Education: N/A   Occupational History  .      disabled   Social History Main  Topics  . Smoking status: Never Smoker   . Smokeless tobacco: Never Used  . Alcohol Use: No  . Drug Use: No  . Sexual Activity: Not on file   Other Topics Concern  . Not on file   Social History Narrative   Disabled   Single     Family History  Problem Relation Age of Onset  . Coronary artery disease Other   . Heart attack Mother   . Hypertension Mother   .  Hyperlipidemia Mother   . Heart attack Brother 15    MI     Review of Systems: Review of Systems  Constitutional: Positive for malaise/fatigue. Negative for fever, chills, weight loss and diaphoresis.  HENT: Negative for congestion.   Eyes: Positive for discharge. Negative for blurred vision, double vision, photophobia, pain and redness.  Respiratory: Negative for cough, hemoptysis, sputum production, shortness of breath and wheezing.   Cardiovascular: Positive for leg swelling. Negative for chest pain, palpitations, orthopnea, claudication and PND.       Chest pressure Swelling of the RLE  Gastrointestinal: Negative for heartburn, nausea, vomiting and abdominal pain.  Genitourinary: Negative for hematuria.  Musculoskeletal: Negative for falls.  Skin: Negative for rash.  Neurological: Positive for weakness. Negative for dizziness, sensory change, speech change and focal weakness.       +vertigo  Endo/Heme/Allergies: Does not bruise/bleed easily.  Psychiatric/Behavioral: Negative for substance abuse. The patient is nervous/anxious.   All other systems reviewed and are negative.    Labs:  Recent Labs  04/01/15 2125 04/02/15 0452  TROPONINI 0.03 <0.03   Lab Results  Component Value Date   WBC 13.5* 04/01/2015   HGB 14.1 04/01/2015   HCT 44.1 04/01/2015   MCV 85.7 04/01/2015   PLT 282 04/01/2015    Recent Labs Lab 04/01/15 2125  NA 144  K 4.0  CL 107  CO2 26  BUN 15  CREATININE 1.17  CALCIUM 9.9  GLUCOSE 114*   Lab Results  Component Value Date   CHOL 194 12/30/2013   HDL 42 12/30/2013    LDLCALC 120* 12/30/2013   TRIG 158 12/30/2013   No results found for: DDIMER  Radiology/Studies:  Dg Chest 1 View  04/01/2015   CLINICAL DATA:  Chest and jaw pain  EXAM: CHEST  1 VIEW  COMPARISON:  February 17, 2015  FINDINGS: The heart size and mediastinal contours are stable. The heart size enlarged. There is no focal infiltrate, pulmonary edema, or pleural effusion. The visualized skeletal structures are stable.  IMPRESSION: No active cardiopulmonary disease.   Electronically Signed   By: Abelardo Diesel M.D.   On: 04/01/2015 21:41    EKG: NSR, 83 bpm, nonspecific intraventricular conduction delay, inferolateral st/t changes  Weights: Filed Weights   04/02/15 0101  Weight: 282 lb 12.8 oz (128.277 kg)     Physical Exam: Blood pressure 170/83, pulse 73, temperature 97.7 F (36.5 C), temperature source Oral, resp. rate 18, height 5\' 5"  (1.651 m), weight 282 lb 12.8 oz (128.277 kg), SpO2 96 %. Body mass index is 47.06 kg/(m^2). General: Well developed, well nourished, in no acute distress. Head: Normocephalic, atraumatic, sclera non-icteric, no xanthomas, nares are without discharge.  Neck: Negative for carotid bruits. JVD not elevated. Lungs: Clear bilaterally to auscultation without wheezes, rales, or rhonchi. Breathing is unlabored. Heart: RRR with S1 S2. No murmurs, rubs, or gallops appreciated. Abdomen: Obese, soft, non-tender, non-distended with normoactive bowel sounds. No hepatomegaly. No rebound/guarding. No obvious abdominal masses. Msk:  Strength and tone appear normal for age. Extremities: No clubbing or cyanosis. RLE with 1+ pitting edema to the mid calf and erythematous.  Distal pedal pulses are 2+ and equal bilaterally. Pin point rash along the upper and lower extremities.  Neuro: Alert and oriented X 3. No facial asymmetry. No focal deficit. Moves all extremities spontaneously. Psych:  Responds to questions appropriately with a normal affect.    Assessment and Plan:  59  y.o. male with h/o CAD s/p 6 vessel CABG  in 4259, chronic diastolic CHF, morbid obesity, venous insufficiency on right side, OSA on CPAP, OCD, anxiety, panic disorder, medication noncompliance, and GERD who presents to [facility] ED on 8/16 for evaluation of rash, but left AMA, again on 8/24 with eye discharge and left AMA, and for a third time on 8/25 with tooth pain and chest pressure, finally staying to be admitted.  1. Chest pressure: -Negative cardiac enzymes -Has eaten breakfast this morning -Symptoms appear to be more closely linked to his tooth pain -Plan for outpatient nuclear stress testing if symptoms persist  2. CAD s/p CABG: -Less likely angina -Continue aspirin 81 mg, Lopressor 25 mg bid, simvastatin 40 mg  3. Chronic diastolic CHF: -Restarted patient's Lasix 80 mg bid -Continue Lopressor 25 mg bid  4. Venous insufficiency: -Compression hose during the day when he is up and moving about, can take off while sleeping -Could possibly benefit from outpatient follow up from vein and vascular evaluation   5. Anxiety/panic disorder: -Patient asks multiple times about diffuse bacterial infections -Could benefit from further treatment  6. Rash: -Could benefit from possible skin scrapping and possible steroid Rx? -Currently on Atarax   7. Medication noncompliance   Signed, Christell Faith, PA-C Pager: [phone] 04/02/2015, 10:28 AM

## 2015-04-02 NOTE — Progress Notes (Signed)
Pt is a&o, VSS with a complaint of chest pain x1 in which PRN meds were given with relief. Order to discharge pt to home. Discharge instructions and prescription given to pt with verbal understanding. CM set pt up with referral for [facility]. Pt now being escorted off unit via wheelchair by volunteer.

## 2015-04-02 NOTE — Care Management (Signed)
Patient requests contact information for [facility].  Provided him with phone number.  He says he has initiated an application but has not heard from them. Denies issues accessing medical care, transportation and meds.

## 2015-04-07 NOTE — Discharge Summary (Signed)
[place] [facility] [place] NAME: Evan [name]    MR#:  [id number]  DATE OF BIRTH:  1956/07/25  DATE OF ADMISSION:  04/01/2015 ADMITTING PHYSICIAN: Lytle Butte, MD  DATE OF DISCHARGE: 04/02/2015  3:17 PM  PRIMARY CARE PHYSICIAN: WEEKS,CYNTHIA, MD    ADMISSION DIAGNOSIS:  Infected dental carries [K02.9] Chest pain, unspecified chest pain type [R07.9]  DISCHARGE DIAGNOSIS:  Principal Problem:   Chest pain, central Active Problems:   Tooth pain   SECONDARY DIAGNOSIS:   Past Medical History  Diagnosis Date  . CAD (coronary artery disease)   . Hypertension   . Hyperlipidemia   . Gastroesophageal reflux   . Obstructive sleep apnea   . History of pneumonia     Remote  . History of scarlet fever     Childhood  . Obesity   . Cellulitis   . Myocardial infarction 2009  . COPD (chronic obstructive pulmonary disease)   . H/O: GI bleed   . Anxiety disorder   . Panic disorder      ADMITTING HISTORY  Evan [name] is a 59 y.o. male with a known history of coronary artery disease, type 2 diabetes non-insulin-requiring presenting with chest pain. The patient is a fairly poor historian however states having pain that occurred suddenly in the jaw with radiation into the chest located retrosternal. Describes chest pain as pressure in quality, 8/10 in intensity no further radiation other than mentioned, no worsening or relieving factors. No further associated symptoms including nausea, vomiting, diaphoresis. Currently chest pain-free and now is complaining of tooth pain.   HOSPITAL COURSE:   Patient was admitted onto telemetry floor to rule out acute coronary syndrome. Troponin was normal. EKG looked unchanged. By the time of discharge patient has ambulated in the hallway without any chest pain or shortness of breath. Was seen by cardiology who have advised scheduling an outpatient stress test. Patient will follow at [place]  [facility] for his stress test in 1 week. Prescriptions for his medications were given and patient was discharged home in a stable condition.  He did have a dental cavity for which he was given pain medications and antibiotics and requested to follow-up with a dentist.   CONSULTS OBTAINED:  Treatment Team:  Lytle Butte, MD  DRUG ALLERGIES:   Allergies  Allergen Reactions  . Penicillin G Hives  . Sulfa Antibiotics Hives    DISCHARGE MEDICATIONS:   Discharge Medication List as of 04/02/2015  2:41 PM    START taking these medications   Details  oxyCODONE (OXY IR/ROXICODONE) 5 MG immediate release tablet Take 1 tablet (5 mg total) by mouth every 6 (six) hours as needed for moderate pain or severe pain., Starting 04/02/2015, Until Discontinued, Print      CONTINUE these medications which have NOT CHANGED   Details  aspirin 81 MG EC tablet Take 81 mg by mouth 2 (two) times daily. , Until Discontinued, Historical Med    esomeprazole (NEXIUM) 40 MG capsule Take 40 mg by mouth daily. , Until Discontinued, Historical Med    fluticasone (FLONASE) 50 MCG/ACT nasal spray Place 2 sprays into both nostrils daily. , Starting 12/31/2013, Until Discontinued, Historical Med    furosemide (LASIX) 40 MG tablet Take 80 mg in the am with 40 mg in the pm., Until Discontinued, Historical Med    hydrOXYzine (ATARAX/VISTARIL) 25 MG tablet Take 25 mg by mouth at bedtime., Until Discontinued, Historical Med  metFORMIN (GLUCOPHAGE) 500 MG tablet Take 1 tablet (500 mg total) by mouth 2 (two) times daily with a meal., Starting 02/20/2015, Until Discontinued, Print    metoprolol tartrate (LOPRESSOR) 25 MG tablet Take 25 mg by mouth 2 (two) times daily.  , Until Discontinued, Historical Med    potassium chloride (K-DUR) 10 MEQ tablet Take 1 tablet (10 mEq total) by mouth daily., Starting 04/14/2014, Until Discontinued, Normal    PROAIR HFA 108 (90 BASE) MCG/ACT inhaler Inhale 1 puff into the  lungs every 4 (four) hours as needed. , Starting 12/22/2013, Until Discontinued, Historical Med    rosuvastatin (CRESTOR) 20 MG tablet Take 20 mg by mouth daily., Until Discontinued, Historical Med    polyethylene glycol powder (GLYCOLAX/MIRALAX) powder Take 1 Container by mouth daily. , Starting 10/06/2013, Until Discontinued, Historical Med      STOP taking these medications     albuterol (PROVENTIL) (2.5 MG/3ML) 0.083% nebulizer solution      ranitidine (ZANTAC) 150 MG capsule      simvastatin (ZOCOR) 40 MG tablet          Today    VITAL SIGNS:  Blood pressure 151/88, pulse 69, temperature 98.2 F (36.8 C), temperature source Oral, resp. rate 20, height 5\' 5"  (1.651 m), weight 128.277 kg (282 lb 12.8 oz), SpO2 98 %.  I/O:  No intake or output data in the 24 hours ending 04/07/15 1425  PHYSICAL EXAMINATION:  Physical Exam  GENERAL:  59 y.o.-year-old patient lying in the bed with no acute distress.  LUNGS: Normal breath sounds bilaterally, no wheezing, rales,rhonchi or crepitation. No use of accessory muscles of respiration.  CARDIOVASCULAR: S1, S2 normal. No murmurs, rubs, or gallops.  ABDOMEN: Soft, non-tender, non-distended. Bowel sounds present. No organomegaly or mass.  NEUROLOGIC: Moves all 4 extremities. PSYCHIATRIC: The patient is alert and oriented x 3.  SKIN: No obvious rash, lesion, or ulcer.   DATA REVIEW:   CBC  Recent Labs Lab 04/01/15 2125  WBC 13.5*  HGB 14.1  HCT 44.1  PLT 282    Chemistries   Recent Labs Lab 04/01/15 2125  NA 144  K 4.0  CL 107  CO2 26  GLUCOSE 114*  BUN 15  CREATININE 1.17  CALCIUM 9.9    Cardiac Enzymes  Recent Labs Lab 04/02/15 1103  TROPONINI <0.03    Microbiology Results  Results for orders placed or performed during the hospital encounter of 02/17/15  Blood culture (single)     Status: None   Collection Time: 02/17/15  8:06 PM  Result Value Ref Range Status   Specimen Description BLOOD LEFT HAND   Final   Special Requests BOTTLES DRAWN AEROBIC AND ANAEROBIC 2ML  Final   Culture NO GROWTH 5 DAYS  Final   Report Status 02/22/2015 FINAL  Final    RADIOLOGY:  No results found.    Follow up with PCP in 1 week.  Management plans discussed with the patient, family and they are in agreement.  CODE STATUS:   TOTAL TIME TAKING CARE OF THIS PATIENT ON DAY OF DISCHARGE: more than 30 minutes.    Hillary Bow R M.D on 04/07/2015 at 2:25 PM  Between 7am to 6pm - Pager - [phone]  After 6pm go to [email or web address] - password [email or web address] [facility]  Office  [phone]  CC: Primary care physician; Joanie Coddington, MD

## 2015-04-13 ENCOUNTER — Encounter: Payer: Self-pay | Admitting: Family

## 2015-04-13 ENCOUNTER — Ambulatory Visit: Payer: Medicare Other | Attending: Family | Admitting: Family

## 2015-04-13 VITALS — BP 147/68 | HR 75 | Resp 20 | Ht 65.0 in | Wt 286.0 lb

## 2015-04-13 DIAGNOSIS — Z8701 Personal history of pneumonia (recurrent): Secondary | ICD-10-CM | POA: Diagnosis not present

## 2015-04-13 DIAGNOSIS — J449 Chronic obstructive pulmonary disease, unspecified: Secondary | ICD-10-CM | POA: Insufficient documentation

## 2015-04-13 DIAGNOSIS — I10 Essential (primary) hypertension: Secondary | ICD-10-CM

## 2015-04-13 DIAGNOSIS — F410 Panic disorder [episodic paroxysmal anxiety] without agoraphobia: Secondary | ICD-10-CM | POA: Diagnosis not present

## 2015-04-13 DIAGNOSIS — E669 Obesity, unspecified: Secondary | ICD-10-CM | POA: Diagnosis not present

## 2015-04-13 DIAGNOSIS — Z7982 Long term (current) use of aspirin: Secondary | ICD-10-CM | POA: Diagnosis not present

## 2015-04-13 DIAGNOSIS — G4733 Obstructive sleep apnea (adult) (pediatric): Secondary | ICD-10-CM | POA: Diagnosis not present

## 2015-04-13 DIAGNOSIS — I252 Old myocardial infarction: Secondary | ICD-10-CM | POA: Insufficient documentation

## 2015-04-13 DIAGNOSIS — I5032 Chronic diastolic (congestive) heart failure: Secondary | ICD-10-CM | POA: Diagnosis not present

## 2015-04-13 DIAGNOSIS — Z79899 Other long term (current) drug therapy: Secondary | ICD-10-CM | POA: Diagnosis not present

## 2015-04-13 DIAGNOSIS — E119 Type 2 diabetes mellitus without complications: Secondary | ICD-10-CM | POA: Insufficient documentation

## 2015-04-13 DIAGNOSIS — K219 Gastro-esophageal reflux disease without esophagitis: Secondary | ICD-10-CM | POA: Insufficient documentation

## 2015-04-13 DIAGNOSIS — E785 Hyperlipidemia, unspecified: Secondary | ICD-10-CM | POA: Insufficient documentation

## 2015-04-13 DIAGNOSIS — F419 Anxiety disorder, unspecified: Secondary | ICD-10-CM | POA: Insufficient documentation

## 2015-04-13 DIAGNOSIS — I2510 Atherosclerotic heart disease of native coronary artery without angina pectoris: Secondary | ICD-10-CM | POA: Diagnosis not present

## 2015-04-13 DIAGNOSIS — E1159 Type 2 diabetes mellitus with other circulatory complications: Secondary | ICD-10-CM

## 2015-04-13 NOTE — Patient Instructions (Signed)
Continue weighing daily and call for an overnight weight gain of > 2 pounds or a weekly weight gain of >5 pounds.  Low-Sodium Eating Plan Sodium raises blood pressure and causes water to be held in the body. Getting less sodium from food will help lower your blood pressure, reduce any swelling, and protect your heart, liver, and kidneys. We get sodium by adding salt (sodium chloride) to food. Most of our sodium comes from canned, boxed, and frozen foods. Restaurant foods, fast foods, and pizza are also very high in sodium. Even if you take medicine to lower your blood pressure or to reduce fluid in your body, getting less sodium from your food is important. WHAT IS MY PLAN? Most people should limit their sodium intake to 2,300 mg a day. Your health care provider recommends that you limit your sodium intake to __2000mg__ a day.  WHAT DO I NEED TO KNOW ABOUT THIS EATING PLAN? For the low-sodium eating plan, you will follow these general guidelines:  Choose foods with a % Daily Value for sodium of less than 5% (as listed on the food label).   Use salt-free seasonings or herbs instead of table salt or sea salt.   Check with your health care provider or pharmacist before using salt substitutes.   Eat fresh foods.  Eat more vegetables and fruits.  Limit canned vegetables. If you do use them, rinse them well to decrease the sodium.   Limit cheese to 1 oz (28 g) per day.   Eat lower-sodium products, often labeled as "lower sodium" or "no salt added."  Avoid foods that contain monosodium glutamate (MSG). MSG is sometimes added to Chinese food and some canned foods.  Check food labels (Nutrition Facts labels) on foods to learn how much sodium is in one serving.  Eat more home-cooked food and less restaurant, buffet, and fast food.  When eating at a restaurant, ask that your food be prepared with less salt or none, if possible.  HOW DO I READ FOOD LABELS FOR SODIUM INFORMATION? The  Nutrition Facts label lists the amount of sodium in one serving of the food. If you eat more than one serving, you must multiply the listed amount of sodium by the number of servings. Food labels may also identify foods as:  Sodium free--Less than 5 mg in a serving.  Very low sodium--35 mg or less in a serving.  Low sodium--140 mg or less in a serving.  Light in sodium--50% less sodium in a serving. For example, if a food that usually has 300 mg of sodium is changed to become light in sodium, it will have 150 mg of sodium.  Reduced sodium--25% less sodium in a serving. For example, if a food that usually has 400 mg of sodium is changed to reduced sodium, it will have 300 mg of sodium. WHAT FOODS CAN I EAT? Grains Low-sodium cereals, including oats, puffed wheat and rice, and shredded wheat cereals. Low-sodium crackers. Unsalted rice and pasta. Lower-sodium bread.  Vegetables Frozen or fresh vegetables. Low-sodium or reduced-sodium canned vegetables. Low-sodium or reduced-sodium tomato sauce and paste. Low-sodium or reduced-sodium tomato and vegetable juices.  Fruits Fresh, frozen, and canned fruit. Fruit juice.  Meat and Other Protein Products Low-sodium canned tuna and salmon. Fresh or frozen meat, poultry, seafood, and fish. Lamb. Unsalted nuts. Dried beans, peas, and lentils without added salt. Unsalted canned beans. Homemade soups without salt. Eggs.  Dairy Milk. Soy milk. Ricotta cheese. Low-sodium or reduced-sodium cheeses. Yogurt.  Condiments Fresh   and dried herbs and spices. Salt-free seasonings. Onion and garlic powders. Low-sodium varieties of mustard and ketchup. Lemon juice.  Fats and Oils Reduced-sodium salad dressings. Unsalted butter.  Other Unsalted popcorn and pretzels.  The items listed above may not be a complete list of recommended foods or beverages. Contact your dietitian for more options. WHAT FOODS ARE NOT RECOMMENDED? Grains Instant hot cereals.  Bread stuffing, pancake, and biscuit mixes. Croutons. Seasoned rice or pasta mixes. Noodle soup cups. Boxed or frozen macaroni and cheese. Self-rising flour. Regular salted crackers. Vegetables Regular canned vegetables. Regular canned tomato sauce and paste. Regular tomato and vegetable juices. Frozen vegetables in sauces. Salted french fries. Olives. Pickles. Relishes. Sauerkraut. Salsa. Meat and Other Protein Products Salted, canned, smoked, spiced, or pickled meats, seafood, or fish. Bacon, ham, sausage, hot dogs, corned beef, chipped beef, and packaged luncheon meats. Salt pork. Jerky. Pickled herring. Anchovies, regular canned tuna, and sardines. Salted nuts. Dairy Processed cheese and cheese spreads. Cheese curds. Blue cheese and cottage cheese. Buttermilk.  Condiments Onion and garlic salt, seasoned salt, table salt, and sea salt. Canned and packaged gravies. Worcestershire sauce. Tartar sauce. Barbecue sauce. Teriyaki sauce. Soy sauce, including reduced sodium. Steak sauce. Fish sauce. Oyster sauce. Cocktail sauce. Horseradish. Regular ketchup and mustard. Meat flavorings and tenderizers. Bouillon cubes. Hot sauce. Tabasco sauce. Marinades. Taco seasonings. Relishes. Fats and Oils Regular salad dressings. Salted butter. Margarine. Ghee. Bacon fat.  Other Potato and tortilla chips. Corn chips and puffs. Salted popcorn and pretzels. Canned or dried soups. Pizza. Frozen entrees and pot pies.  The items listed above may not be a complete list of foods and beverages to avoid. Contact your dietitian for more information. Document Released: 01/13/2002 Document Revised: 07/29/2013 Document Reviewed: 05/28/2013 [employer] Patient Information 2015 [employer]. This information is not intended to replace advice given to you by your health care provider. Make sure you discuss any questions you have with your health care provider.  

## 2015-04-13 NOTE — Progress Notes (Signed)
Subjective:    Patient ID: Evan [name], male    DOB: September 07, 1955, 59 y.o.   MRN: [id number]  Congestive Heart Failure Presents for follow-up visit. The disease course has been improving. Associated symptoms include edema, fatigue and shortness of breath. Pertinent negatives include no abdominal pain, chest pain, chest pressure, orthopnea, palpitations or unexpected weight change. The symptoms have been improving. Past treatments include beta blockers and salt and fluid restriction. The treatment provided mild relief. Compliance with prior treatments has been good. His past medical history is significant for CAD, chronic lung disease and HTN. There is no history of CVA. He has multiple 1st degree relatives with heart disease. Compliance with total regimen is 76-100%.  Other This is a chronic (edema) problem. The current episode started more than 1 year ago. The problem occurs daily. The problem has been gradually improving. Associated symptoms include coughing (mucus) and fatigue. Pertinent negatives include no abdominal pain, chest pain, congestion, headaches, neck pain, numbness, sore throat or weakness. The symptoms are aggravated by standing and walking. He has tried position changes and lying down for the symptoms. The treatment provided mild relief.    Past Medical History  Diagnosis Date  . CAD (coronary artery disease)   . Hypertension   . Hyperlipidemia   . Gastroesophageal reflux   . Obstructive sleep apnea   . History of pneumonia     Remote  . History of scarlet fever     Childhood  . Obesity   . Cellulitis   . Myocardial infarction 2009  . COPD (chronic obstructive pulmonary disease)   . H/O: GI bleed   . Anxiety disorder   . Panic disorder    Past Surgical History  Procedure Laterality Date  . Cardiac catheterization    . Coronary angioplasty with stent placement  2002  . Coronary angioplasty with stent placement  1999  . Cataract extraction Left   . Coronary  artery bypass graft      x7    Family History  Problem Relation Age of Onset  . Coronary artery disease Other   . Heart attack Mother   . Hypertension Mother   . Hyperlipidemia Mother   . Heart attack Brother 48    MI   Social History  Substance Use Topics  . Smoking status: Never Smoker   . Smokeless tobacco: Never Used  . Alcohol Use: No    Allergies  Allergen Reactions  . Penicillin G Hives  . Sulfa Antibiotics Hives    Prior to Admission medications   Medication Sig Start Date End Date Taking? Authorizing Provider  aspirin 81 MG EC tablet Take 81 mg by mouth 2 (two) times daily.    Yes Historical Provider, MD  esomeprazole (NEXIUM) 40 MG capsule Take 40 mg by mouth daily.    Yes Historical Provider, MD  fluticasone (FLONASE) 50 MCG/ACT nasal spray Place 2 sprays into both nostrils daily.  12/31/13  Yes Historical Provider, MD  furosemide (LASIX) 40 MG tablet 80 mg 2 (two) times daily. Take 80 mg in the am with 40 mg in the pm.   Yes Historical Provider, MD  hydrOXYzine (ATARAX/VISTARIL) 25 MG tablet Take 25 mg by mouth at bedtime.   Yes Historical Provider, MD  metFORMIN (GLUCOPHAGE) 500 MG tablet Take 1 tablet (500 mg total) by mouth 2 (two) times daily with a meal. Patient taking differently: Take 1,000 mg by mouth at bedtime.  02/20/15  Yes Nicholes Mango, MD  metoprolol tartrate (LOPRESSOR)  25 MG tablet Take 50 mg by mouth 2 (two) times daily.    Yes Historical Provider, MD  oxyCODONE (OXY IR/ROXICODONE) 5 MG immediate release tablet Take 1 tablet (5 mg total) by mouth every 6 (six) hours as needed for moderate pain or severe pain. 04/02/15  Yes Srikar Sudini, MD  potassium chloride (K-DUR) 10 MEQ tablet Take 1 tablet (10 mEq total) by mouth daily. 04/14/14  Yes Minna Merritts, MD  PROAIR HFA 108 (90 BASE) MCG/ACT inhaler Inhale 1 puff into the lungs every 4 (four) hours as needed.  12/22/13  Yes Historical Provider, MD  rosuvastatin (CRESTOR) 20 MG tablet Take 20 mg by mouth  daily.   Yes Historical Provider, MD     Review of Systems  Constitutional: Positive for fatigue. Negative for appetite change and unexpected weight change.  HENT: Positive for dental problem (left upper tooth). Negative for congestion, rhinorrhea and sore throat.   Eyes: Negative.   Respiratory: Positive for cough (mucus) and shortness of breath. Negative for chest tightness and wheezing.   Cardiovascular: Positive for leg swelling (right leg). Negative for chest pain and palpitations.  Gastrointestinal: Positive for abdominal distention. Negative for abdominal pain.  Endocrine: Negative.   Genitourinary: Negative.   Musculoskeletal: Negative for back pain and neck pain.  Skin: Negative.   Allergic/Immunologic: Negative.   Neurological: Positive for dizziness. Negative for weakness, light-headedness, numbness and headaches.  Hematological: Negative for adenopathy. Bruises/bleeds easily.  Psychiatric/Behavioral: Positive for sleep disturbance (lots of stuff on mind; sleeping on wedge and 1 pillow). Negative for dysphoric mood. The patient is not nervous/anxious.        Objective:   Physical Exam  Constitutional: He is oriented to person, place, and time. He appears well-developed and well-nourished.  HENT:  Head: Normocephalic and atraumatic.  Eyes: Conjunctivae are normal. Pupils are equal, round, and reactive to light.  Neck: Normal range of motion. Neck supple.  Cardiovascular: Normal rate and regular rhythm.   Pulmonary/Chest: Effort normal. He has no wheezes. He has no rales.  Abdominal: Soft. He exhibits distension. There is no tenderness.  Musculoskeletal: He exhibits edema (1+ in left lower leg and 2+ in right lower leg). He exhibits no tenderness.  Neurological: He is alert and oriented to person, place, and time.  Skin: Rash noted. No bruising noted. Rash is papular (right lower leg).  Psychiatric: He has a normal mood and affect. His behavior is normal. Thought content  normal.  Nursing note and vitals reviewed.   BP 147/68 mmHg  Pulse 75  Resp 20  Ht 5\' 5"  (1.651 m)  Wt 286 lb (129.729 kg)  BMI 47.59 kg/m2  SpO2 98%       Assessment & Plan:  1: Chronic heart failure with preserved ejection fraction- Patient presents with some shortness of breath upon walking into the office from the parking lot. He did recover quickly after sitting down and resting for a few minutes. He does have some edema in both legs although the right is worse than the left. Does wear a TED hose on the right leg as well as elevates it when he's at home. Currently his furosemide dosage has been increased and he's recently had lab work done. He continues to weigh himself and reports a stable weight. Reminded to call for an overnight weight gain of >2 pounds or a weekly weight gain of >5 pounds. He has lost 8 pounds since he was here last. He is not adding any salt to his  foods and is trying to eat low sodium foods and smaller portion sizes.  2: HTN- Blood pressure looks good today. Continue medications at this time. 3: Sleep apnea- Patient says that he wears his CPAP on a nightly basis. Does report having difficulty sleeping but says that it's because he has a lot of stuff on his mind and not because he can't breathe. 4: Diabetes- Fasting glucose this morning was 136. Taking metformin at bedtime.   Patient currently being worked up for acceptance into the [facility] [employer]. Will make a followup appointment for him in 3 months in case it takes him longer to get into [facility]. Return or call sooner for any questions/problems before then.

## 2015-05-06 ENCOUNTER — Ambulatory Visit: Payer: Medicare Other | Admitting: Cardiovascular Disease

## 2015-05-19 ENCOUNTER — Telehealth: Payer: Self-pay | Admitting: Cardiovascular Disease

## 2015-05-19 NOTE — Telephone Encounter (Signed)
Called patient to schedule fu with [facility].   Patient not interested.  He is now followed with [facility].  Patient instructed to call in the future if we can assist or if needed.  Recall deleted.

## 2015-06-14 ENCOUNTER — Other Ambulatory Visit: Payer: Self-pay | Admitting: Geriatric Medicine

## 2015-06-14 DIAGNOSIS — R1011 Right upper quadrant pain: Secondary | ICD-10-CM

## 2015-06-16 ENCOUNTER — Emergency Department: Payer: Medicare (Managed Care)

## 2015-06-16 ENCOUNTER — Other Ambulatory Visit: Payer: Self-pay

## 2015-06-16 ENCOUNTER — Emergency Department
Admission: EM | Admit: 2015-06-16 | Discharge: 2015-06-17 | Disposition: A | Discharge status: Home / Self Care | Payer: Medicare (Managed Care) | Attending: Emergency Medicine | Admitting: Emergency Medicine

## 2015-06-16 DIAGNOSIS — E119 Type 2 diabetes mellitus without complications: Secondary | ICD-10-CM | POA: Diagnosis not present

## 2015-06-16 DIAGNOSIS — Z7982 Long term (current) use of aspirin: Secondary | ICD-10-CM | POA: Diagnosis not present

## 2015-06-16 DIAGNOSIS — L03119 Cellulitis of unspecified part of limb: Secondary | ICD-10-CM

## 2015-06-16 DIAGNOSIS — Z880 Allergy status to penicillin: Secondary | ICD-10-CM | POA: Diagnosis not present

## 2015-06-16 DIAGNOSIS — H6501 Acute serous otitis media, right ear: Secondary | ICD-10-CM | POA: Diagnosis not present

## 2015-06-16 DIAGNOSIS — I10 Essential (primary) hypertension: Secondary | ICD-10-CM | POA: Insufficient documentation

## 2015-06-16 DIAGNOSIS — M5412 Radiculopathy, cervical region: Secondary | ICD-10-CM | POA: Diagnosis not present

## 2015-06-16 DIAGNOSIS — J441 Chronic obstructive pulmonary disease with (acute) exacerbation: Secondary | ICD-10-CM | POA: Insufficient documentation

## 2015-06-16 DIAGNOSIS — H6500 Acute serous otitis media, unspecified ear: Secondary | ICD-10-CM

## 2015-06-16 DIAGNOSIS — L03115 Cellulitis of right lower limb: Secondary | ICD-10-CM | POA: Diagnosis not present

## 2015-06-16 DIAGNOSIS — Z79899 Other long term (current) drug therapy: Secondary | ICD-10-CM | POA: Diagnosis not present

## 2015-06-16 DIAGNOSIS — R51 Headache: Secondary | ICD-10-CM | POA: Insufficient documentation

## 2015-06-16 DIAGNOSIS — R0600 Dyspnea, unspecified: Secondary | ICD-10-CM

## 2015-06-16 DIAGNOSIS — H9201 Otalgia, right ear: Secondary | ICD-10-CM | POA: Diagnosis present

## 2015-06-16 HISTORY — DX: Unspecified asthma, uncomplicated: J45.909

## 2015-06-16 HISTORY — DX: Heart failure, unspecified: I50.9

## 2015-06-16 HISTORY — DX: Type 2 diabetes mellitus without complications: E11.9

## 2015-06-16 LAB — CBC WITH DIFFERENTIAL/PLATELET
Basophils Absolute: 0.1 10*3/uL (ref 0–0.1)
Basophils Relative: 1 %
Eosinophils Absolute: 0.4 10*3/uL (ref 0–0.7)
Eosinophils Relative: 4 %
HCT: 40.4 % (ref 40.0–52.0)
Hemoglobin: 13.1 g/dL (ref 13.0–18.0)
Lymphocytes Relative: 27 %
Lymphs Abs: 2.8 10*3/uL (ref 1.0–3.6)
MCH: 28 pg (ref 26.0–34.0)
MCHC: 32.4 g/dL (ref 32.0–36.0)
MCV: 86.6 fL (ref 80.0–100.0)
Monocytes Absolute: 0.9 10*3/uL (ref 0.2–1.0)
Monocytes Relative: 9 %
Neutro Abs: 6.1 10*3/uL (ref 1.4–6.5)
Neutrophils Relative %: 59 %
Platelets: 234 10*3/uL (ref 150–440)
RBC: 4.67 MIL/uL (ref 4.40–5.90)
RDW: 16.1 % — ABNORMAL HIGH (ref 11.5–14.5)
WBC: 10.4 10*3/uL (ref 3.8–10.6)

## 2015-06-16 IMAGING — CR DG CHEST 2V
2 series · 2 of 2 positions shown · non-contrast
Comparison: [DATE]

CLINICAL DATA: Globus sensation for 3 weeks.  Difficulty breathing.

EXAM:
CHEST  2 VIEW

[chest pa]
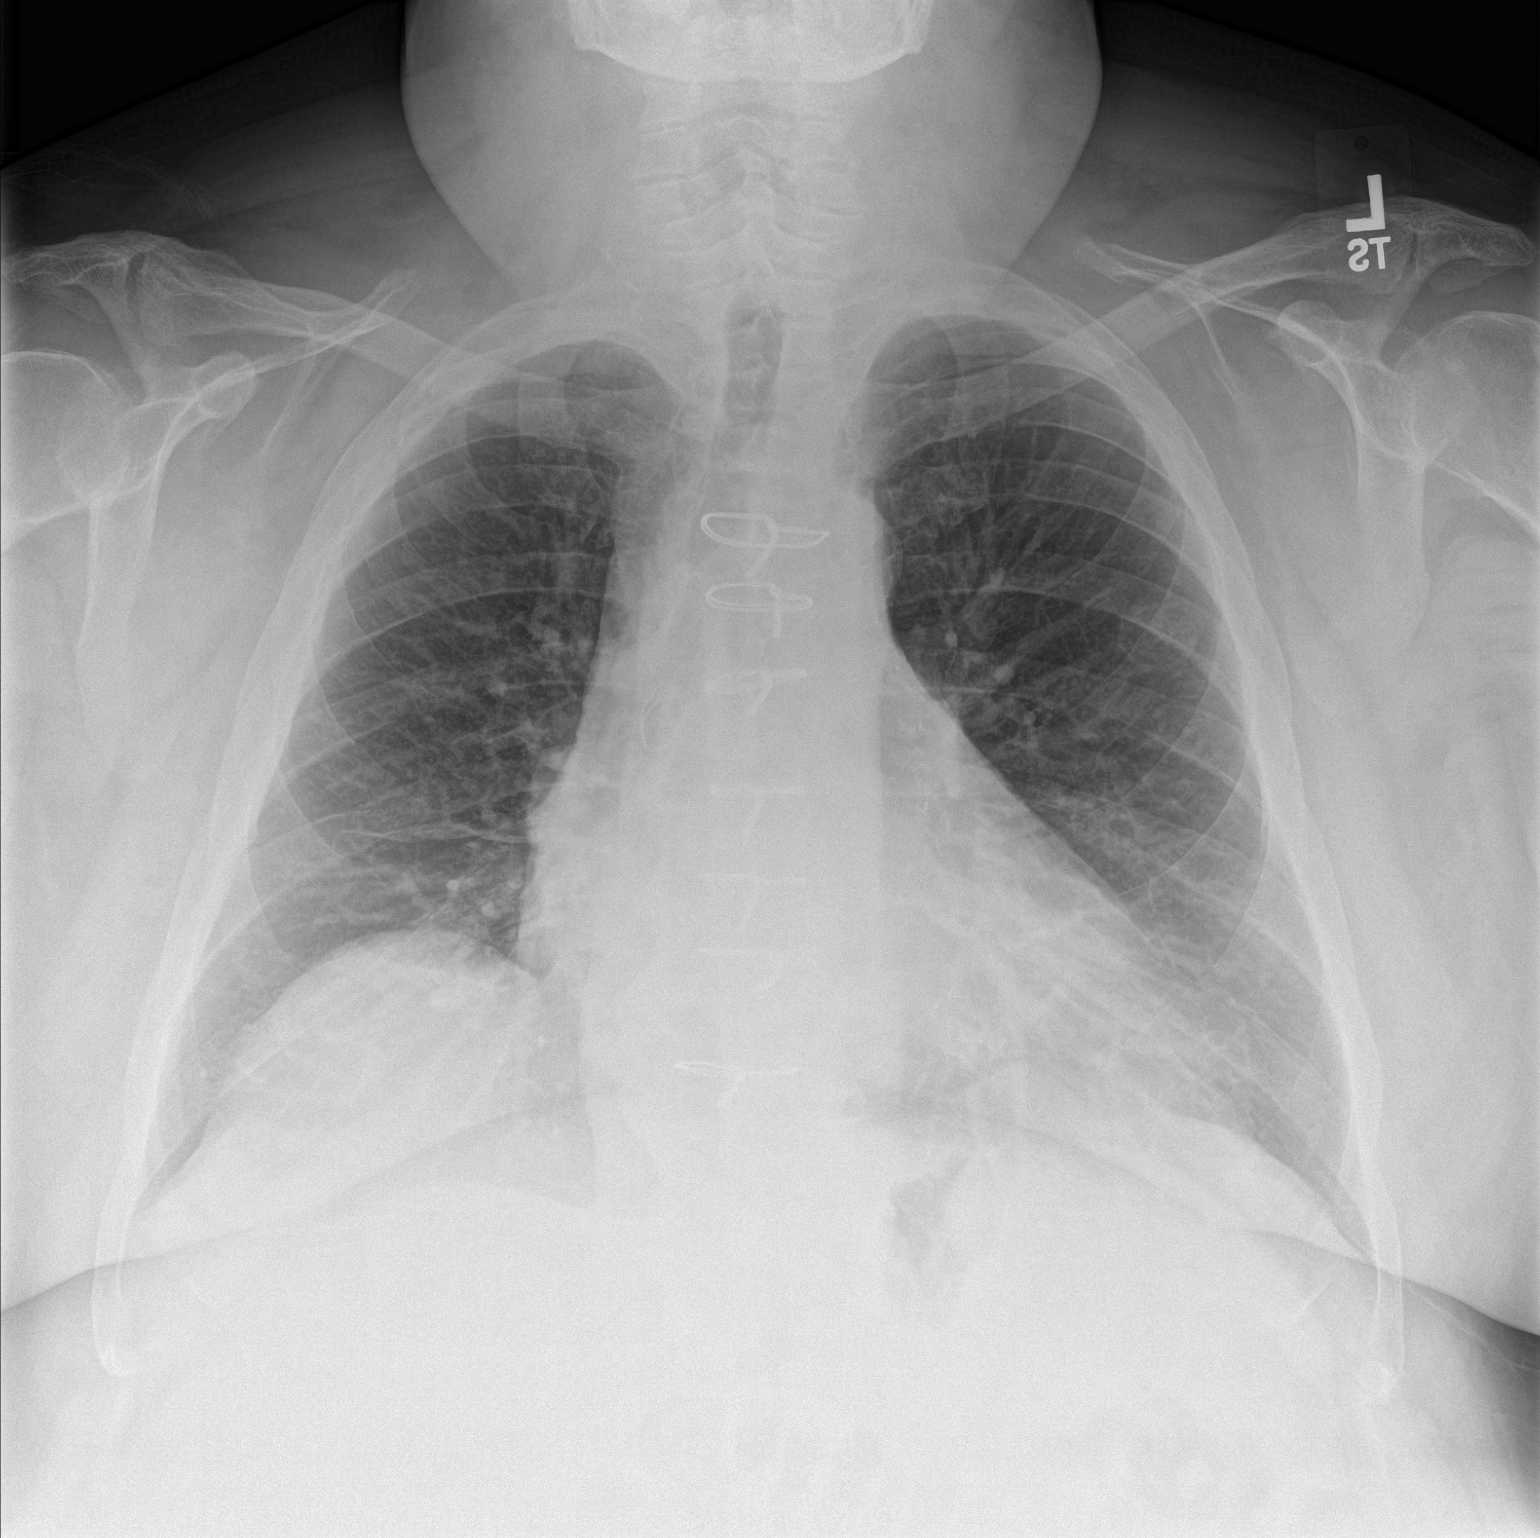

[chest lat]
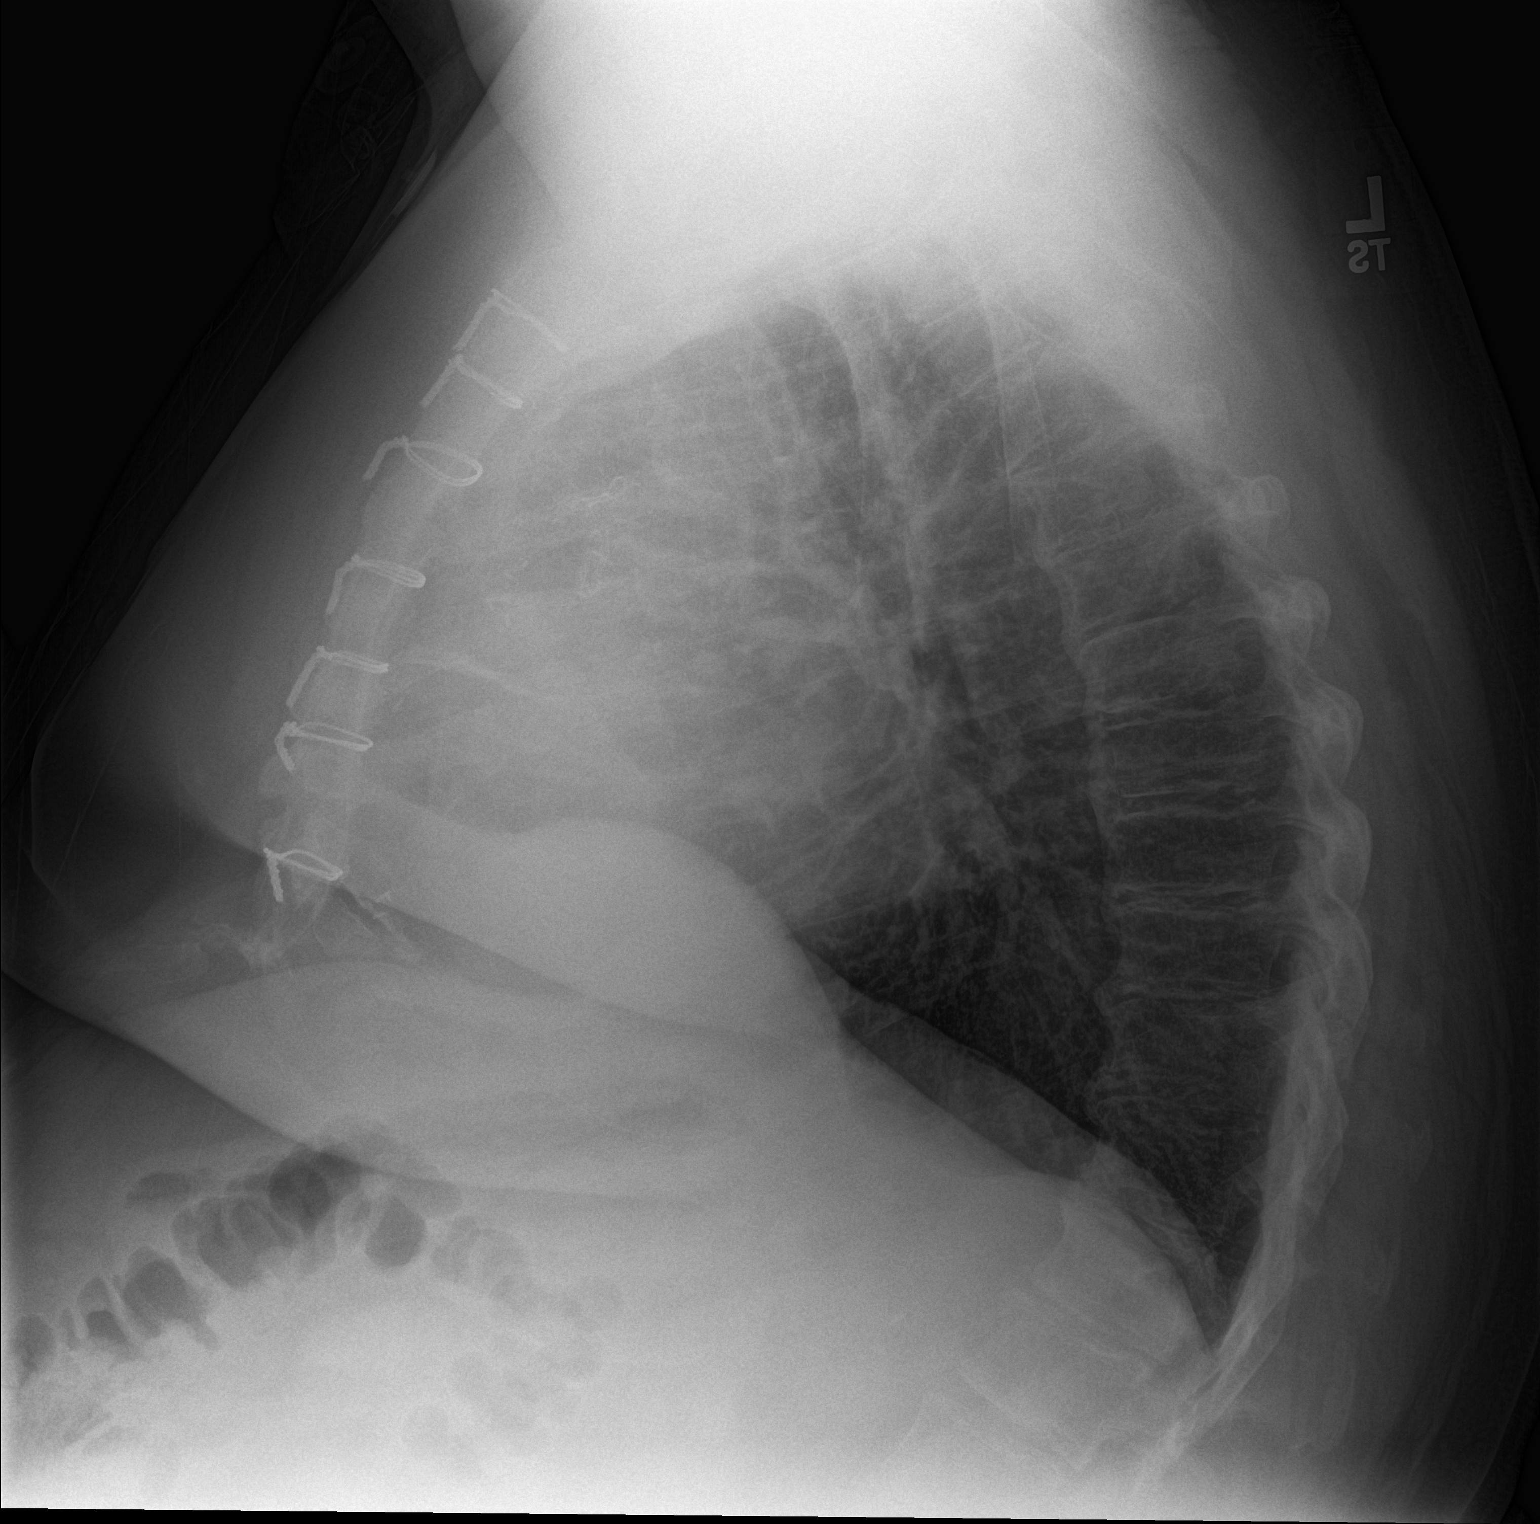

[2 of 2 positions shown; findings below may reference images not displayed]

FINDINGS: The heart is mildly enlarged but stable. Stable surgical changes
from bypass surgery. The lungs are clear. Stable eventration of the
right hemidiaphragm. No pleural effusion. The bony thorax is intact.
Stable degenerative changes involving the thoracic spine.
IMPRESSION: Mild stable cardiac enlargement.  No acute pulmonary findings.

## 2015-06-16 NOTE — ED Notes (Signed)
Patient states he is apart of the [facility]. Patient got involved with [facility] due to cellulitis that began last year and has been an on and off issue ever since. Patient states the past 3 weeks he has had a random sensation that feels like "something stuck in his throat" that makes it hard for him to breathe. This sensation passes with time. Also in the past 3 weeks patient states he has had blurry vision that comes and goes, lasting about an hour each time. Patient also c/o "crackling" in ears.

## 2015-06-16 NOTE — Progress Notes (Signed)
This encounter was created in error - please disregard.

## 2015-06-16 NOTE — ED Provider Notes (Signed)
[facility] Emergency Department Provider Note  ____________________________________________  Time seen: Approximately 10:18 PM  I have reviewed the triage vital signs and the nursing notes.   HISTORY  Chief Complaint Otalgia    HPI Evan [name] is a 59 y.o. male who complains his right ear popping and clicking for about 3 weeks it's getting worse today had an episode of vertigo for less than a minute he also had an episode of about 20 minutes of blurry vision tonight he says he has episodes where the left side of his neck hurts and then his voice gets worse and he has trouble breathing this is happened repeatedly in the last few weeks. Patient cannot associate this with any activity. He says he also has woken up shaking all over In 6-7 times in the last year after he wakes up he gets a bad taste in his mouth this happens while he is on CPAP. Patient did say at one point some of these symptoms seem to get worse if he lays back and get better if he sits up somewhat. Patient reports he had a temperature 99.7 today. Patient also reports she's had a lot of trouble with cellulitis in his right leg and right leg has cellulitis again it's red warm itchy and painful reports the pain feels like someone tazsing him in the leg.Patient also told the PA student in fast track. He had a headache. He did not tell me that.  Past Medical History  Diagnosis Date  . CAD (coronary artery disease)   . Hypertension   . Hyperlipidemia   . Gastroesophageal reflux   . Obstructive sleep apnea   . History of pneumonia     Remote  . History of scarlet fever     Childhood  . Obesity   . Cellulitis   . Myocardial infarction ([place]) 2009  . COPD (chronic obstructive pulmonary disease)   . H/O: GI bleed   . Anxiety disorder   . Panic disorder     Patient Active Problem List   Diagnosis Date Noted  . Obstructive sleep apnea 04/13/2015  . Diabetes ([place]) 04/13/2015  . Tooth pain  04/02/2015  . Chest pain, central 04/01/2015  . Cellulitis of right leg 02/17/2015  . Chronic venous insufficiency 09/10/2014  . Chronic diastolic CHF (congestive heart failure) ([place]) 01/09/2014  . GERD (gastroesophageal reflux disease) 09/12/2013  . Obesity, morbid (more than 100 lbs over ideal weight or BMI > 40) ([place]) 07/26/2011  . OLD MYOCARDIAL INFARCTION 03/02/2010  . HYPERLIPIDEMIA, MIXED 03/20/2009  . HYPERTENSION, BENIGN 03/20/2009  . CORONARY ATHEROSCLEROSIS, ARTERY BYPASS GRAFT 03/20/2009    Past Surgical History  Procedure Laterality Date  . Cardiac catheterization    . Coronary angioplasty with stent placement  2002  . Coronary angioplasty with stent placement  1999  . Cataract extraction Left   . Coronary artery bypass graft      x7    Current Outpatient Rx  Name  Route  Sig  Dispense  Refill  . aspirin 81 MG EC tablet   Oral   Take 81 mg by mouth 2 (two) times daily.          Marland Kitchen esomeprazole (NEXIUM) 40 MG capsule   Oral   Take 40 mg by mouth daily.          . fluticasone (FLONASE) 50 MCG/ACT nasal spray   Each Nare   Place 2 sprays into both nostrils daily.          Marland Kitchen  furosemide (LASIX) 40 MG tablet      80 mg 2 (two) times daily. Take 80 mg in the am with 40 mg in the pm.         . hydrOXYzine (ATARAX/VISTARIL) 25 MG tablet   Oral   Take 25 mg by mouth at bedtime.         . metFORMIN (GLUCOPHAGE) 500 MG tablet   Oral   Take 1 tablet (500 mg total) by mouth 2 (two) times daily with a meal. Patient taking differently: Take 1,000 mg by mouth at bedtime.    60 tablet   0   . metoprolol tartrate (LOPRESSOR) 25 MG tablet   Oral   Take 50 mg by mouth 2 (two) times daily.          Marland Kitchen oxyCODONE (OXY IR/ROXICODONE) 5 MG immediate release tablet   Oral   Take 1 tablet (5 mg total) by mouth every 6 (six) hours as needed for moderate pain or severe pain.   20 tablet   0   . potassium chloride (K-DUR) 10 MEQ tablet   Oral   Take 1 tablet  (10 mEq total) by mouth daily.   30 tablet   6   . PROAIR HFA 108 (90 BASE) MCG/ACT inhaler   Inhalation   Inhale 1 puff into the lungs every 4 (four) hours as needed.          . rosuvastatin (CRESTOR) 20 MG tablet   Oral   Take 20 mg by mouth daily.           Allergies Penicillin g and Sulfa antibiotics  Family History  Problem Relation Age of Onset  . Coronary artery disease Other   . Heart attack Mother   . Hypertension Mother   . Hyperlipidemia Mother   . Heart attack Brother 39    MI    Social History Social History  Substance Use Topics  . Smoking status: Never Smoker   . Smokeless tobacco: Never Used  . Alcohol Use: No    Review of Systems Constitutional: Low-grade fever today Eyes: No visual changes. ENT: No sore throat. Cardiovascular: Denies chest pain. Respiratory: See history of present illness Gastrointesti.al: No abdominal pain.  No nausea, no vomiting.  No diarrhea.  No constipation. Genitourinary: Negative for dysuria. Musculoskeletal: Negative for back pain. Skin: Negative for rash. Neurological: Negative  focal weakness or numbness.  10-point ROS otherwise negative.  ____________________________________________   PHYSICAL EXAM:  VITAL SIGNS: ED Triage Vitals  Enc Vitals Group     BP 06/16/15 2045 150/84 mmHg     Pulse Rate 06/16/15 2045 108     Resp 06/16/15 2045 18     Temp 06/16/15 2045 98.4 F (36.9 C)     Temp src --      SpO2 06/16/15 2045 95 %     Weight 06/16/15 2045 283 lb (128.368 kg)     Height 06/16/15 2045 5\' 5"  (1.651 m)     Head Cir --      Peak Flow --      Pain Score --      Pain Loc --      Pain Edu? --      Excl. in [place]? --     Constitutional: Alert and oriented. Well appearing and in no acute distress. Eyes: Conjunctivae are normal. PERRL. EOMI. Head: Atraumatic. Nose: No congestion/rhinnorhea. Ears TMs are clear there appear to be some bubbles behind the right TM. Mouth/Throat: Mucous  membranes are  moist.  Oropharynx non-erythematous. Neck: No stridor.  Cardiovascular: Normal rate, regular rhythm. Grossly normal heart sounds.  Good peripheral circulation. Respiratory: Normal respiratory effort.  No retractions. Lungs CTAB. Gastrointestinal: Soft and nontender. No distention. No abdominal bruits. No CVA tenderness. Musculoskeletal: Right lower leg is very red warm slightly swollen. It is more redness anteriorly than posteriorly. There is also a number of small scabs so he has been scratching around the ankle. No joint effusions. Neurologic:  Normal speech and language. No gross focal neurologic deficits are appreciated.  Skin: See musculoskeletal when I would discuss the leg.   ____________________________________________   LABS (all labs ordered are listed, but only abnormal results are displayed)  Labs Reviewed  CBC WITH DIFFERENTIAL/PLATELET - Abnormal; Notable for the following:    RDW 16.1 (*)    All other components within normal limits  COMPREHENSIVE METABOLIC PANEL  TROPONIN I   ____________________________________________  EKG  EKG read and interpreted by me shows normal sinus rhythm rate of 86 normal axis patient has slight ST segment depression T-wave inversion 12 and aVL and also in V4 through 6. This was present on EKG done in 04/01/2015. R no new changes. ____________________________________________  RADIOLOGY  ____________________________________________   PROCEDURES    ____________________________________________   INITIAL IMPRESSION / ASSESSMENT AND PLAN / ED COURSE  Pertinent labs & imaging results that were available during my care of the patient were reviewed by me and considered in my medical decision making (see chart for details).  Dr. Owens Shark will follow-up the patient and his labs and x-ray reports ____________________________________________   FINAL CLINICAL IMPRESSION(S) / ED DIAGNOSES  Final diagnoses:  Acute serous otitis media,  recurrence not specified, unspecified laterality  Cellulitis of lower extremity, unspecified laterality  Dyspnea      Nena Polio, MD 06/16/15 2341

## 2015-06-16 NOTE — ED Notes (Signed)
Pt in with co sore throat and right earache.

## 2015-06-16 NOTE — ED Notes (Signed)
Report received from [facility]. Patient care assumed. Patient/RN introduction complete. Will continue to monitor.

## 2015-06-17 ENCOUNTER — Emergency Department: Payer: Medicare (Managed Care)

## 2015-06-17 LAB — TROPONIN I
Troponin I: 0.04 ng/mL — ABNORMAL HIGH (ref <0.031)
Troponin I: <0.03 ng/mL (ref <0.031)

## 2015-06-17 LAB — COMPREHENSIVE METABOLIC PANEL
ALT: 31 U/L (ref 17–63)
AST: 30 U/L (ref 15–41)
Albumin: 3.7 g/dL (ref 3.5–5.0)
Alkaline Phosphatase: 73 U/L (ref 38–126)
Anion gap: 8 (ref 5–15)
BUN: 22 mg/dL — ABNORMAL HIGH (ref 6–20)
CO2: 27 mmol/L (ref 22–32)
Calcium: 9.2 mg/dL (ref 8.9–10.3)
Chloride: 104 mmol/L (ref 101–111)
Creatinine, Ser: 0.95 mg/dL (ref 0.61–1.24)
GFR calc Af Amer: >60 mL/min (ref >60)
GFR calc non Af Amer: >60 mL/min (ref >60)
Glucose, Bld: 120 mg/dL — ABNORMAL HIGH (ref 65–99)
Potassium: 3.6 mmol/L (ref 3.5–5.1)
Sodium: 139 mmol/L (ref 135–145)
Total Bilirubin: 0.5 mg/dL (ref 0.3–1.2)
Total Protein: 7.5 g/dL (ref 6.5–8.1)

## 2015-06-17 IMAGING — CT CT HEAD W/O CM
1 series · 16 of 30 positions shown, 20 images · non-contrast
Comparison: Head CT [DATE]

CLINICAL DATA: Vertigo.  Headache.

EXAM:
CT HEAD WITHOUT CONTRAST
TECHNIQUE: Contiguous axial images were obtained from the base of the skull
through the vertex without intravenous contrast.

[Series 2: head wo · axial · 0.43mm/px · z∈[-145,+12]mm · 16 of 36 slices shown, 20 images]
[im 2/36  brain]
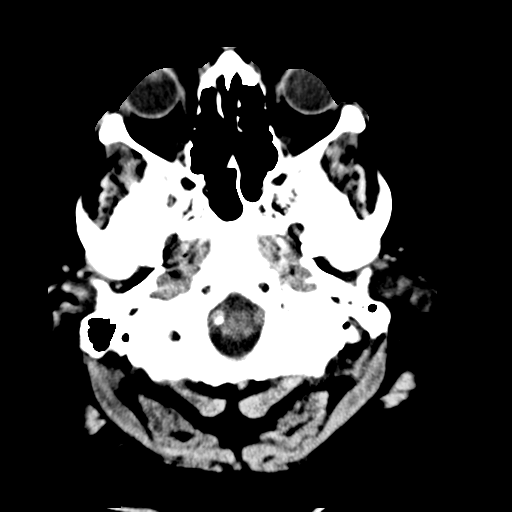
[im 2/36  bone]
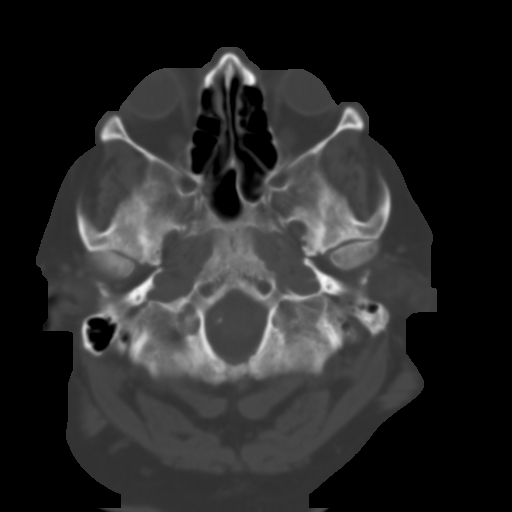
[im 4/36  brain]
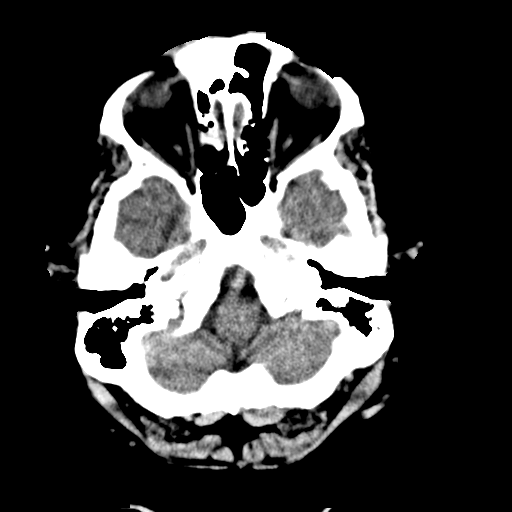
[im 7/36  brain]
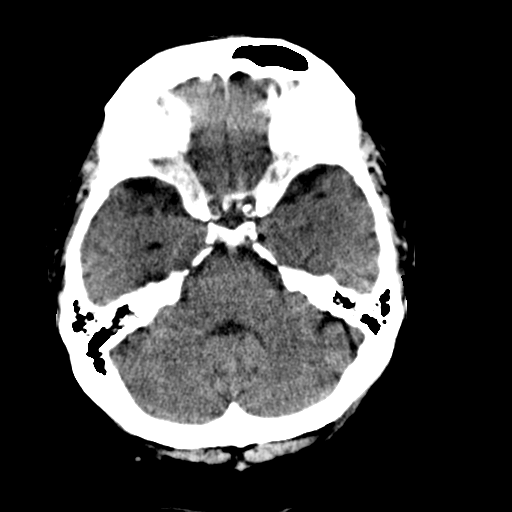
[im 9/36  brain]
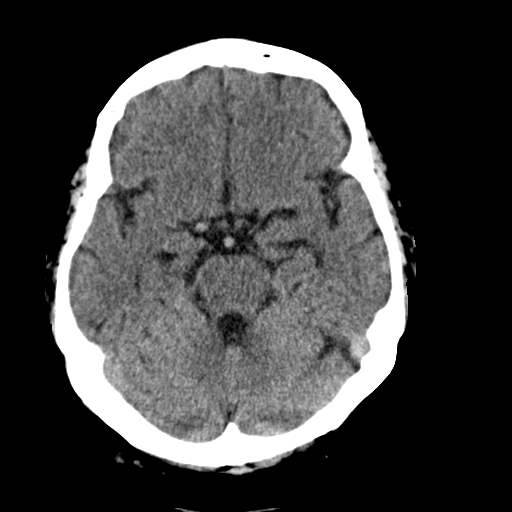
[im 10/36  brain]
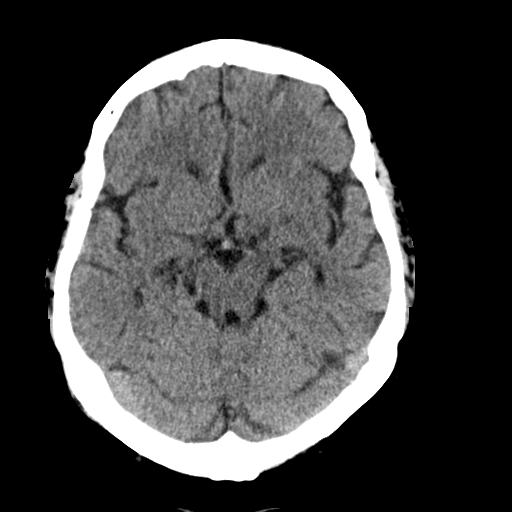
[im 10/36  bone]
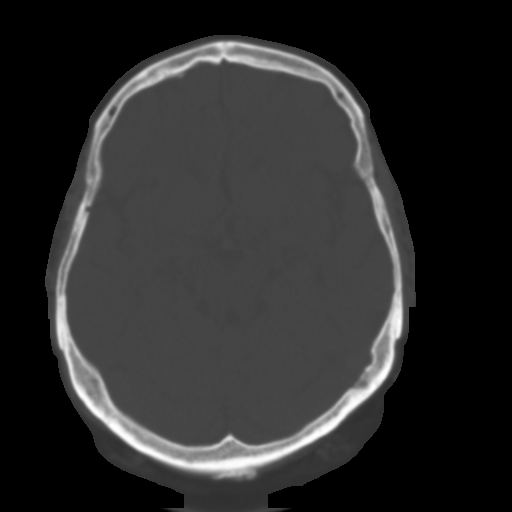
[im 13/36  brain]
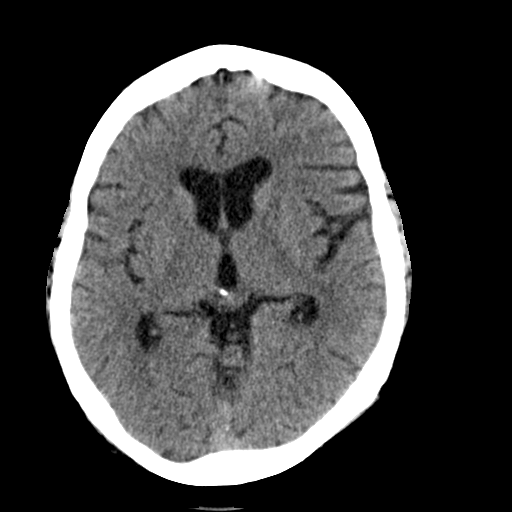
[im 15/36  brain]
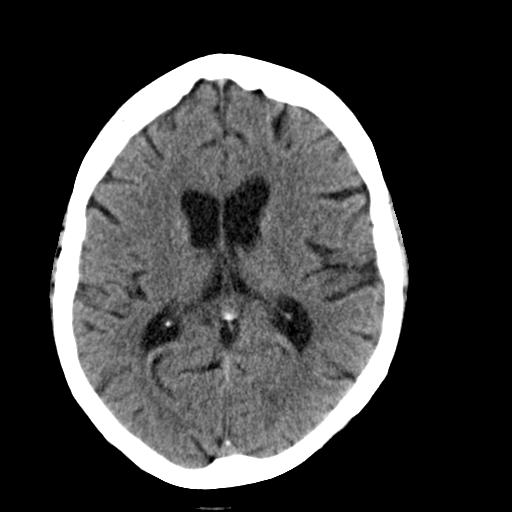
[im 17/36  brain]
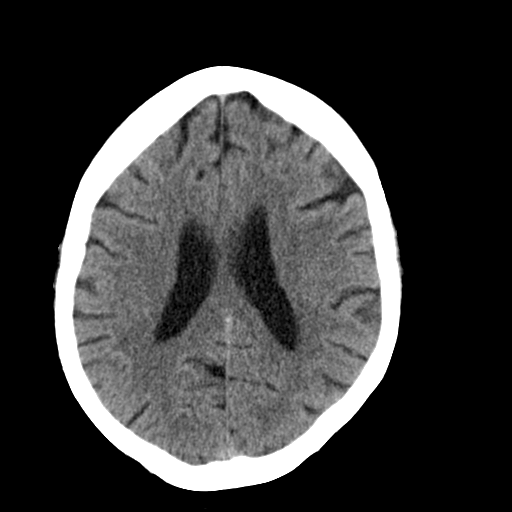
[im 19/36  brain]
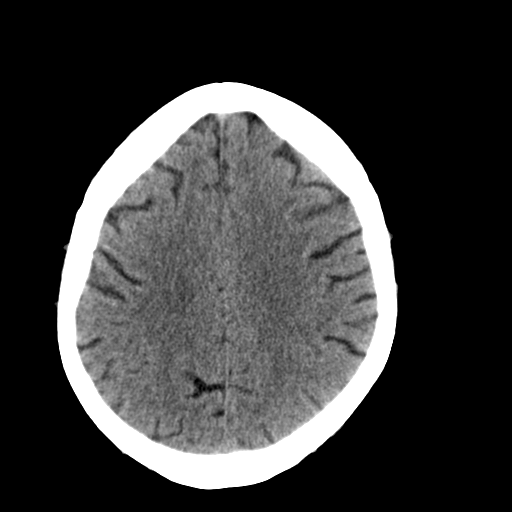
[im 19/36  bone]
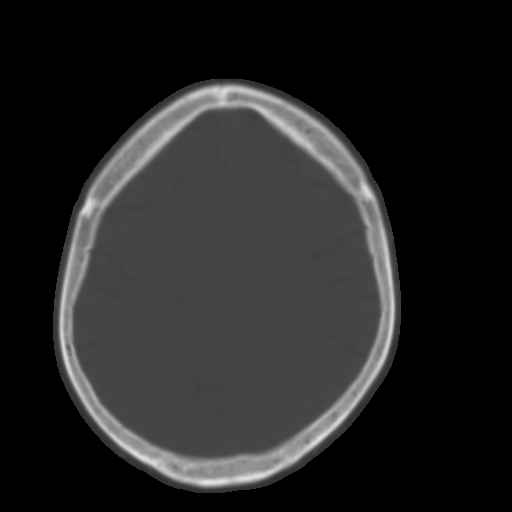
[im 21/36  brain]
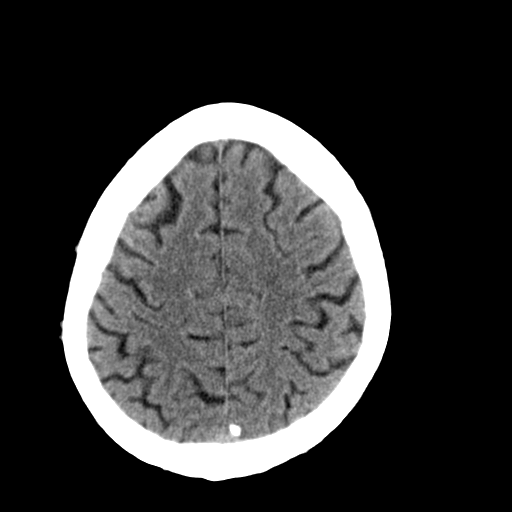
[im 23/36  brain]
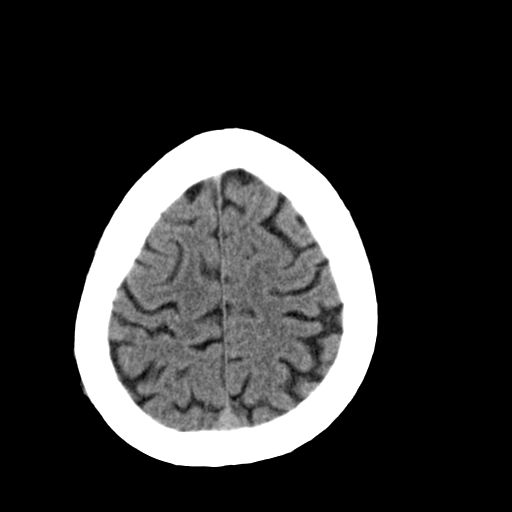
[im 26/36  brain]
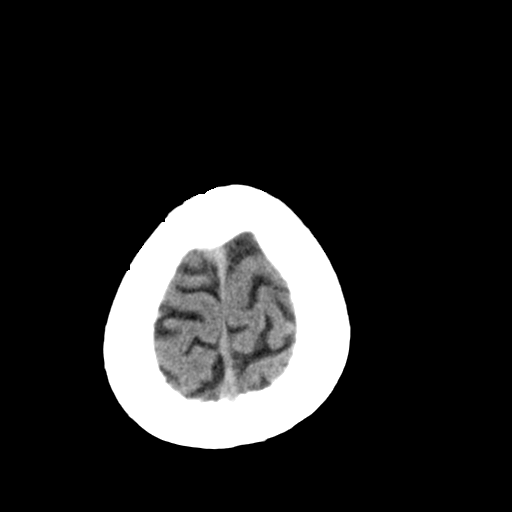
[im 27/36  brain]
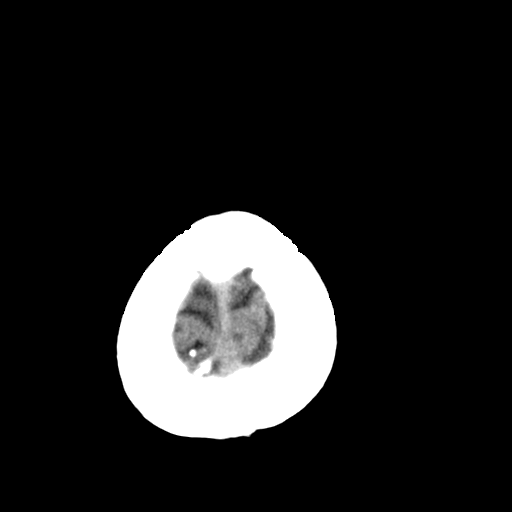
[im 27/36  bone]
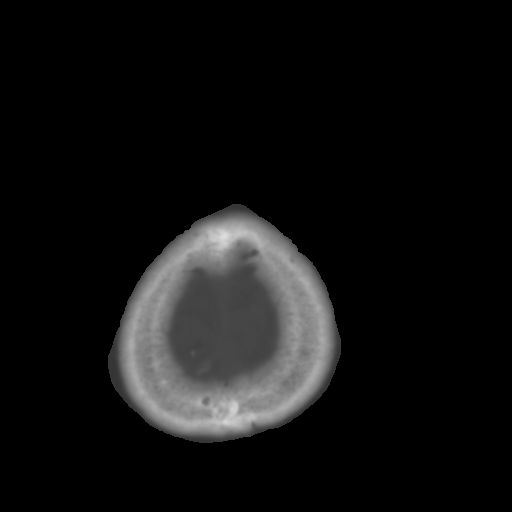
[im 29/36  brain]
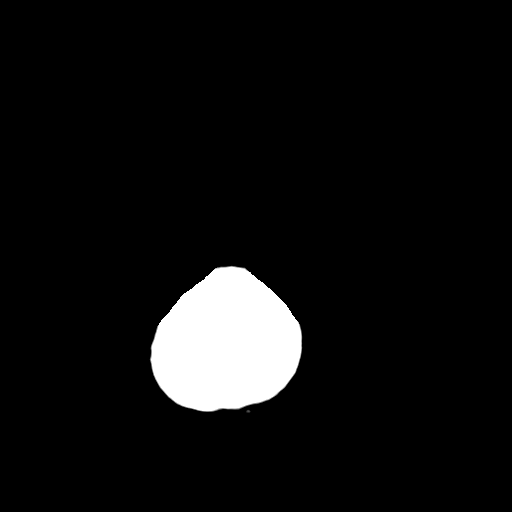
[im 32/36  brain]
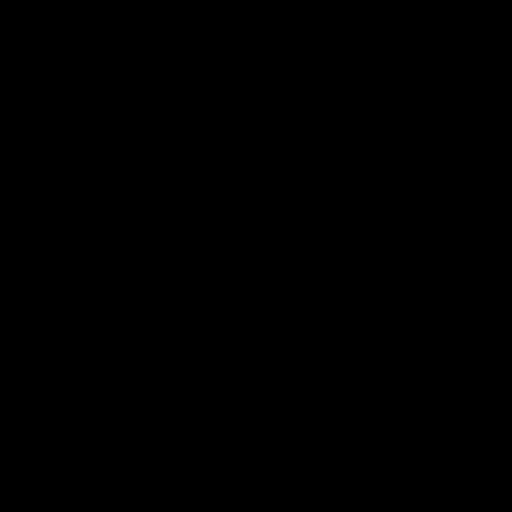
[im 34/36  brain]
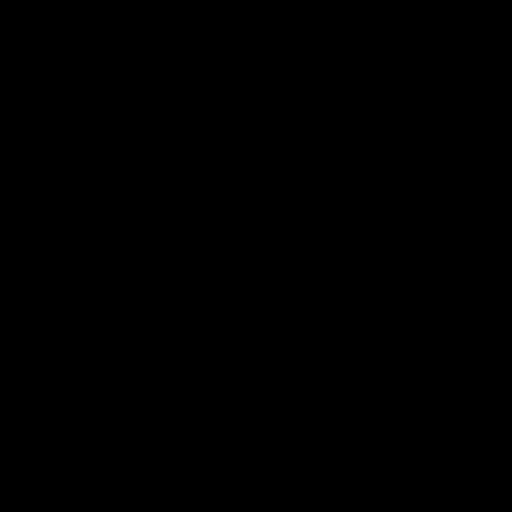

[16 of 30 positions shown; findings below may reference images not displayed]

FINDINGS: No intracranial hemorrhage, mass effect, or midline shift. No
hydrocephalus. The basilar cisterns are patent. No evidence of
territorial infarct. No intracranial fluid collection. Calvarium is
intact. Included paranasal sinuses and mastoid air cells are well
aerated.
IMPRESSION: No acute intracranial abnormality.

## 2015-06-17 IMAGING — CT CT ANGIO CHEST
2 of 7 series · 19 of 46 positions shown · IV contrast (APPLIED)
Comparison: Chest radiograph 1 day prior. Chest CT PE protocol
[DATE]

CLINICAL DATA: Shortness of breath.

EXAM:
CT ANGIOGRAPHY CHEST WITH CONTRAST
TECHNIQUE: Multidetector CT imaging of the chest was performed using the
standard protocol during bolus administration of intravenous
contrast. Multiplanar CT image reconstructions and MIPs were
obtained to evaluate the vascular anatomy.
CONTRAST:  125mL OMNIPAQUE IOHEXOL 350 MG/ML SOLN

[Series 6: pe thins 1.5 · axial · 0.68mm/px · z∈[-461,-198]mm · 16 of 245 slices shown]
[im 13/245  lung]
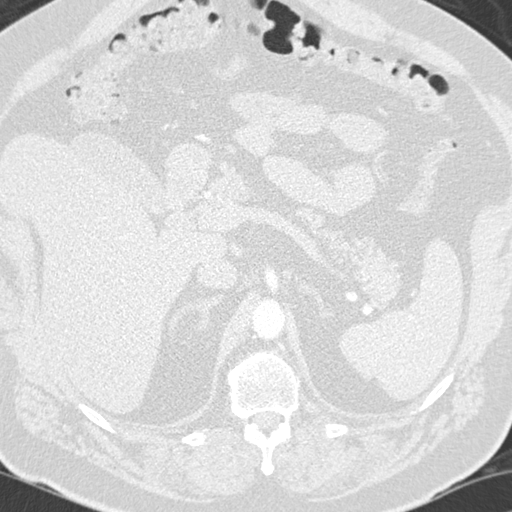
[im 26/245  soft-tissue]
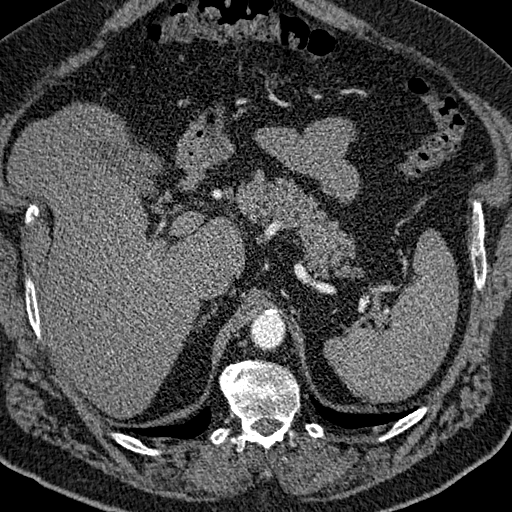
[im 39/245  lung]
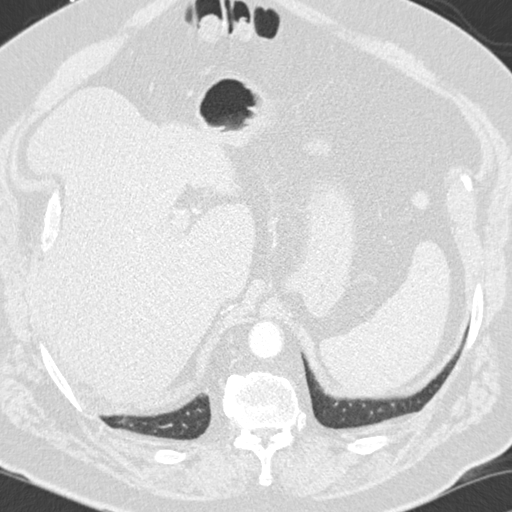
[im 52/245  soft-tissue]
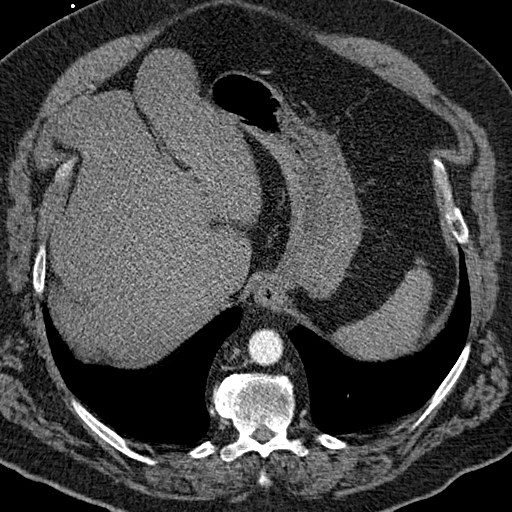
[im 78/245  lung]
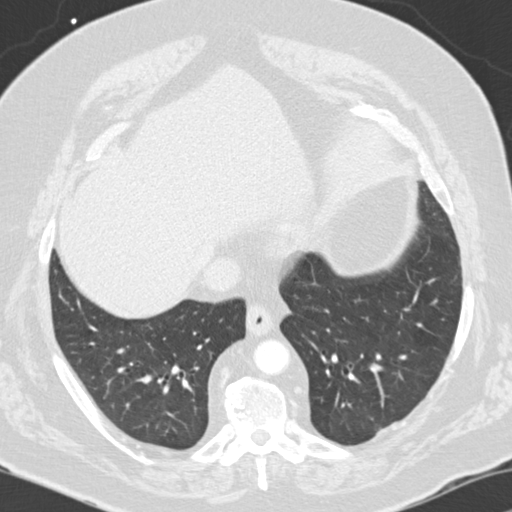
[im 90/245  soft-tissue]
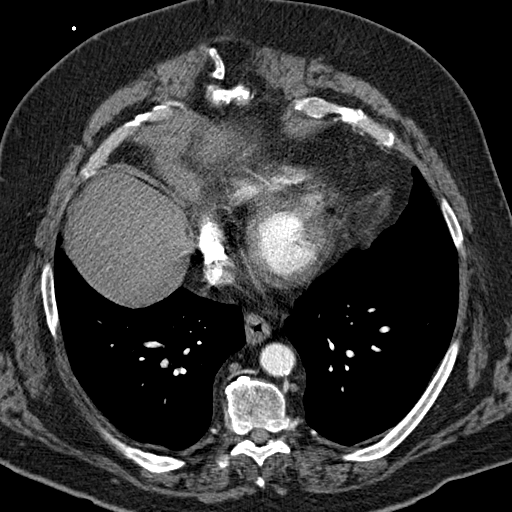
[im 103/245  lung]
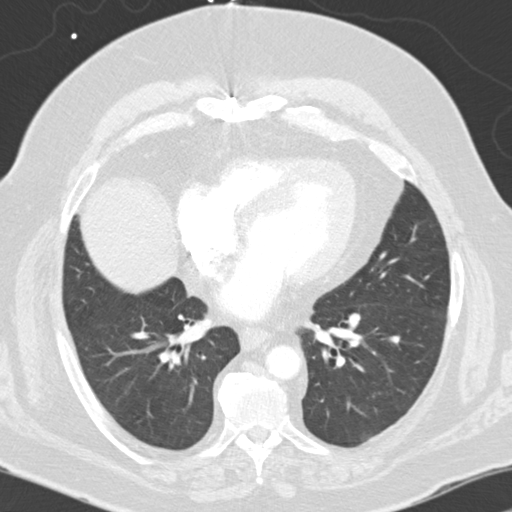
[im 116/245  soft-tissue]
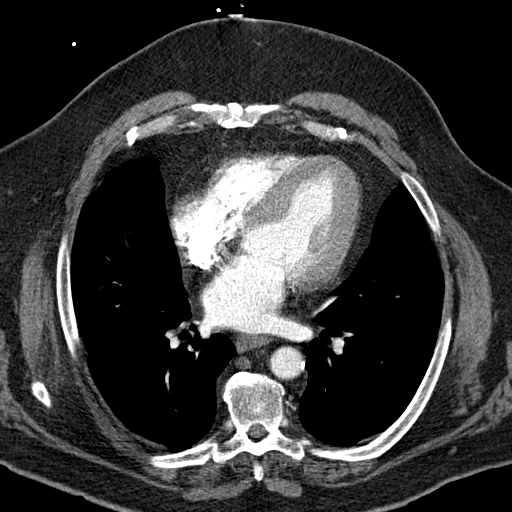
[im 129/245  lung]
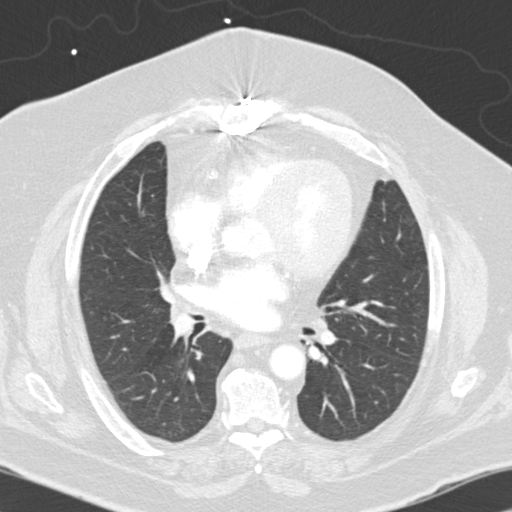
[im 142/245  soft-tissue]
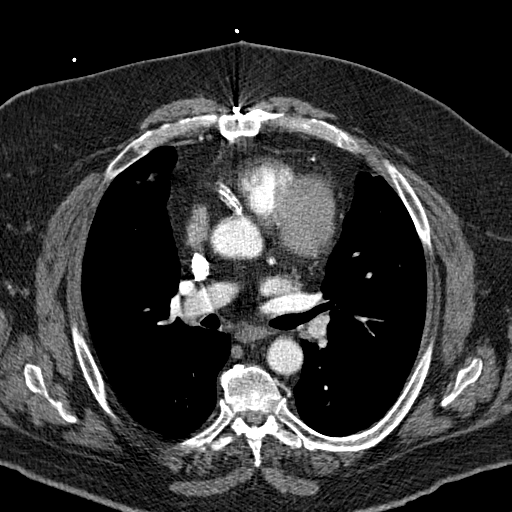
[im 155/245  lung]
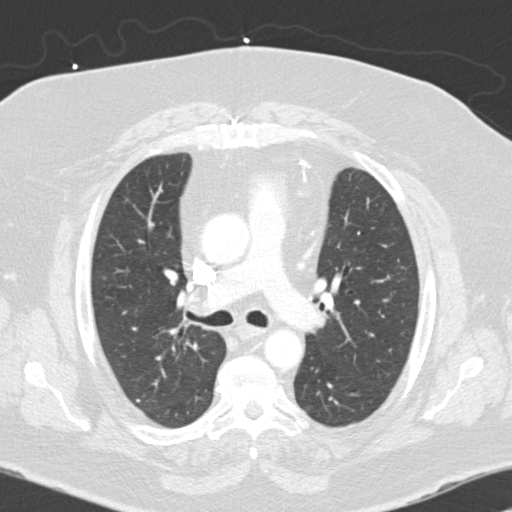
[im 167/245  soft-tissue]
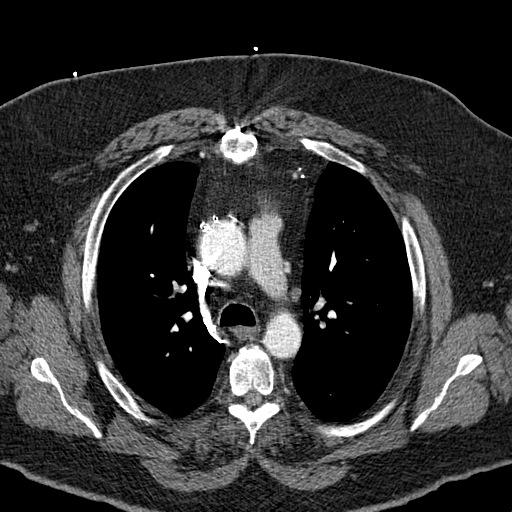
[im 193/245  lung]
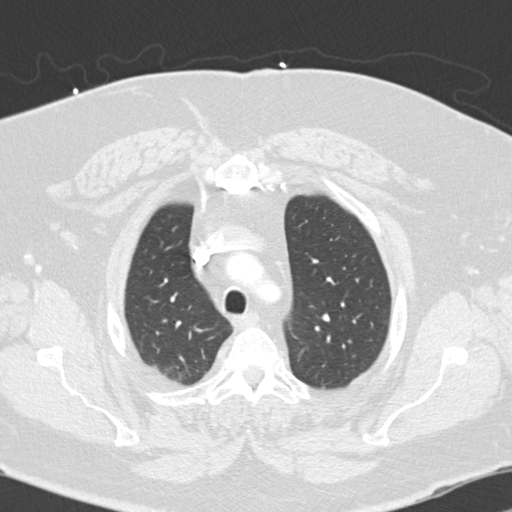
[im 206/245  soft-tissue]
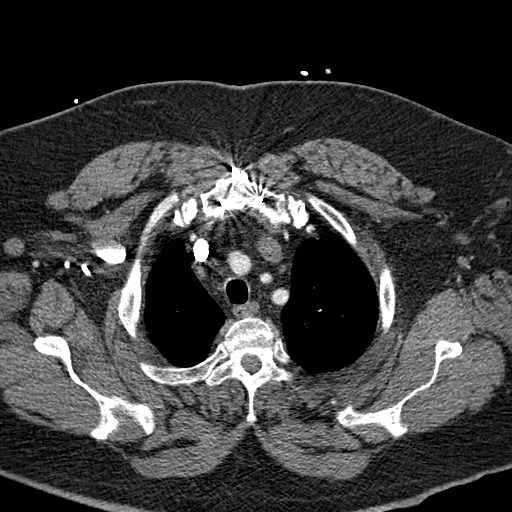
[im 219/245  lung]
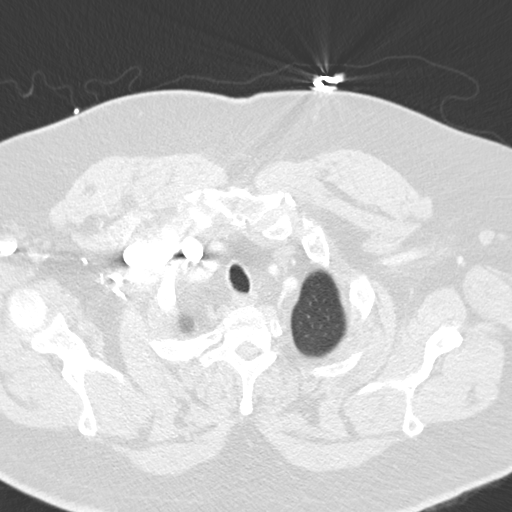
[im 232/245  soft-tissue]
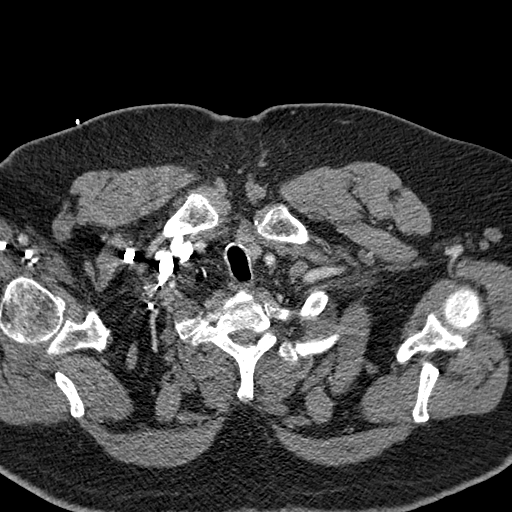

[Series 9: cor mpr 2.0 · coronal · 0.67mm/px · 3 of 140 slices shown]
[im 35/140  soft-tissue]
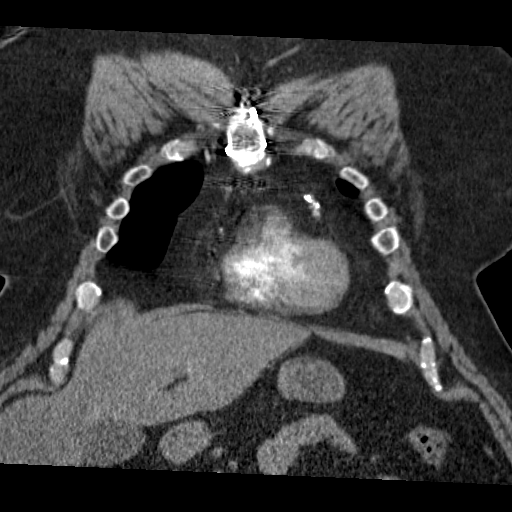
[im 70/140  soft-tissue]
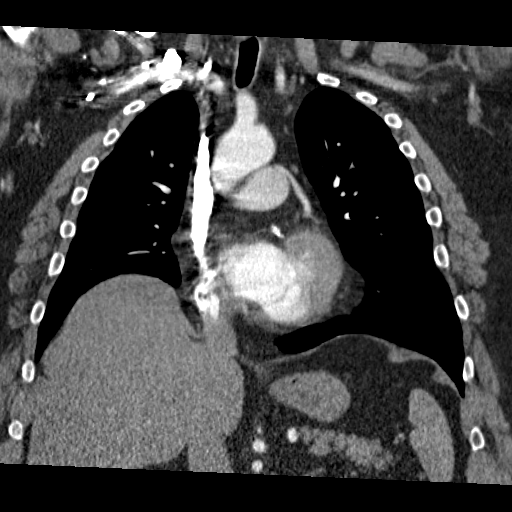
[im 105/140  soft-tissue]
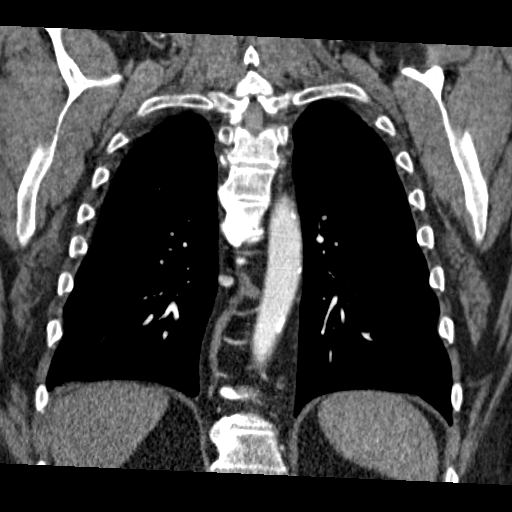

[19 of 46 positions shown; findings below may reference images not displayed]

FINDINGS: No filling defects in the central pulmonary arteries to suggest
pulmonary embolus, the subsegmental branches cannot be assessed due
to contrast bolus timing and soft tissue attenuation from body
habitus.

Mild cardiomegaly. Generalized mediastinal lipomatosis. Thoracic
aorta is normal in caliber with scattered atherosclerosis. No
aneurysm or dissection. Patient is post CABG. No mediastinal or
hilar adenopathy. No pleural or pericardial effusion.

Lungs clear without consolidation or pulmonary edema. Linear
atelectasis in the right middle lobe. Minimal subpleural scarring in
the anterior right upper lobe. No suspicious nodule or pulmonary
mass.

Small hiatal hernia and minimal thickening of the distal esophagus.
Dependent gallstone again seen in the gallbladder. Liver is
prominent size with steatosis.

There are no acute or suspicious osseous abnormalities. Multilevel
dorsal spurring throughout the thoracic spine hemangioma suspected
within T10 vertebral body. Post median sternotomy.

Review of the MIP images confirms the above findings.
IMPRESSION: 1. No pulmonary embolus to the level of the subsegmental arteries.
2. No acute intrathoracic process.

## 2015-06-17 IMAGING — CT CT NECK W/ CM
3 of 4 series · 16 of 33 positions shown, 19 images · IV contrast (APPLIED)
Comparison: None.

CLINICAL DATA: Episodes of neck pain with hoarseness and chest
pain.

EXAM:
CT NECK WITH CONTRAST
TECHNIQUE: Multidetector CT imaging of the neck was performed using the
standard protocol following the bolus administration of intravenous
contrast.
CONTRAST:  125mL OMNIPAQUE IOHEXOL 350 MG/ML SOLN

[Series 1: axial neck---- · axial · 0.47mm/px · z∈[-240,-56]mm · 8 of 116 slices shown, 10 images]
[im 12/116  soft-tissue]
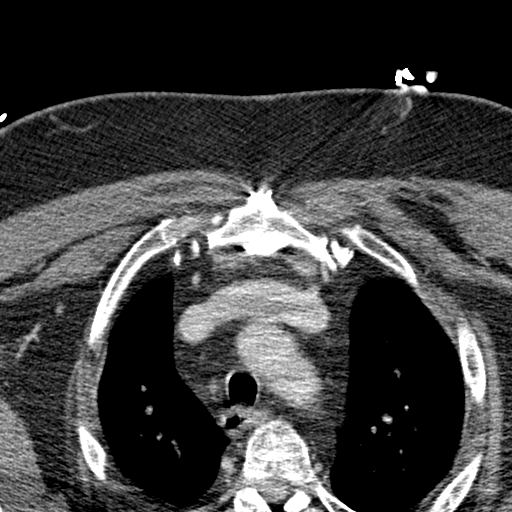
[im 12/116  bone]
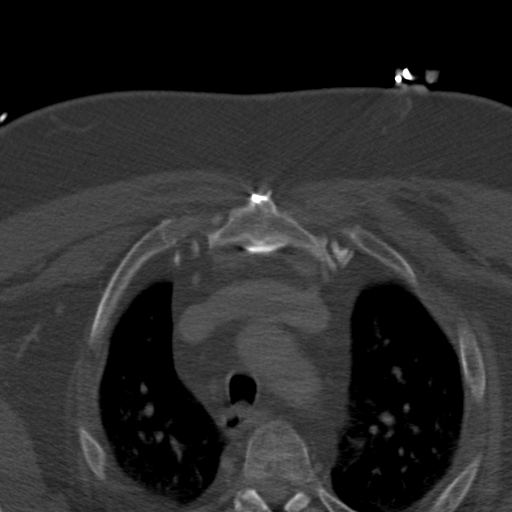
[im 24/116  bone]
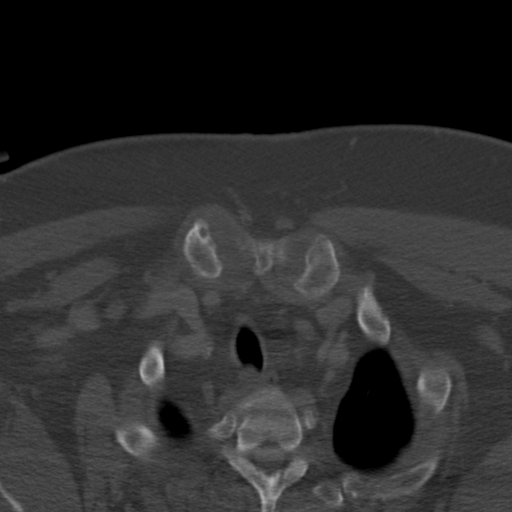
[im 35/116  bone]
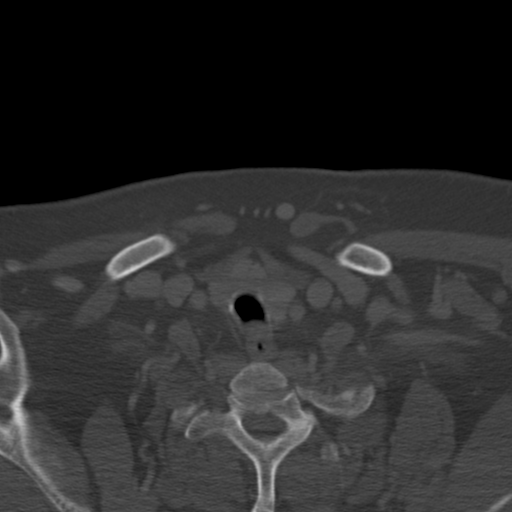
[im 47/116  bone]
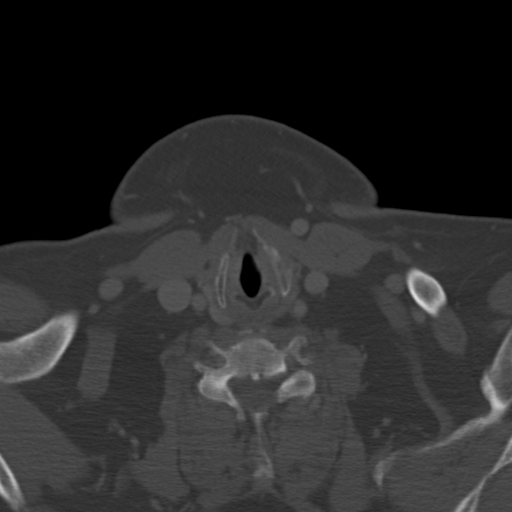
[im 70/116  soft-tissue]
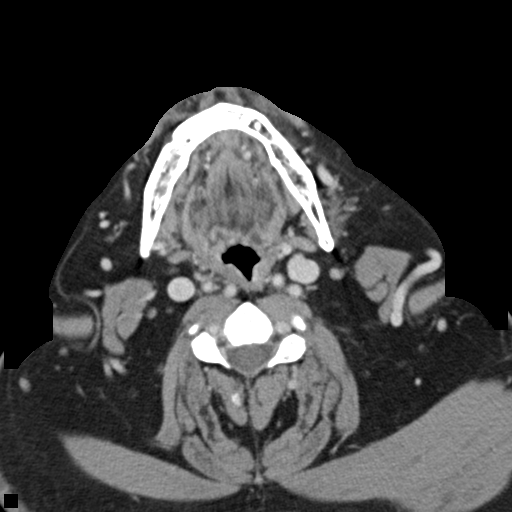
[im 70/116  bone]
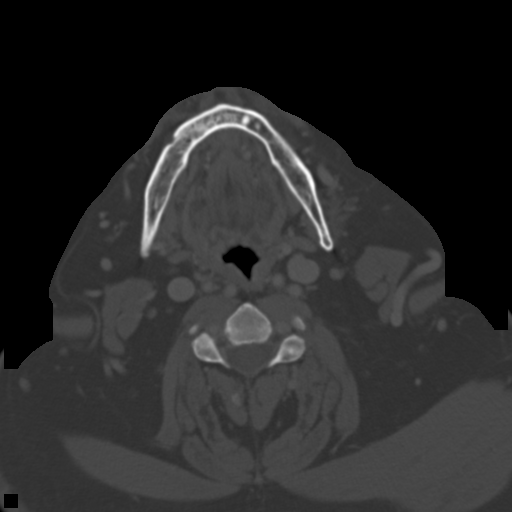
[im 81/116  bone]
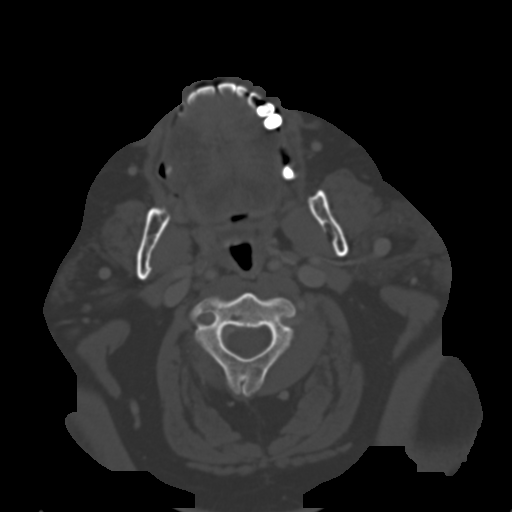
[im 93/116  bone]
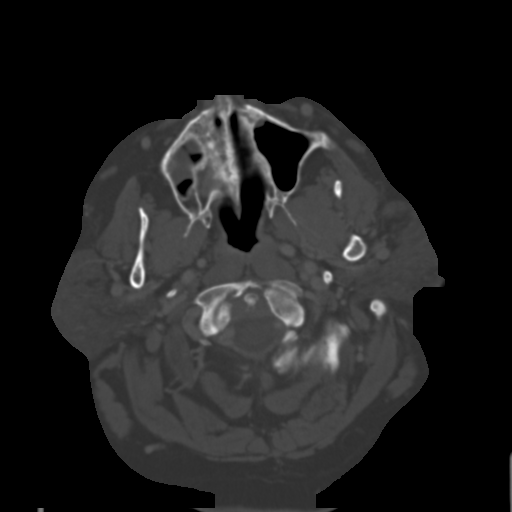
[im 104/116  bone]
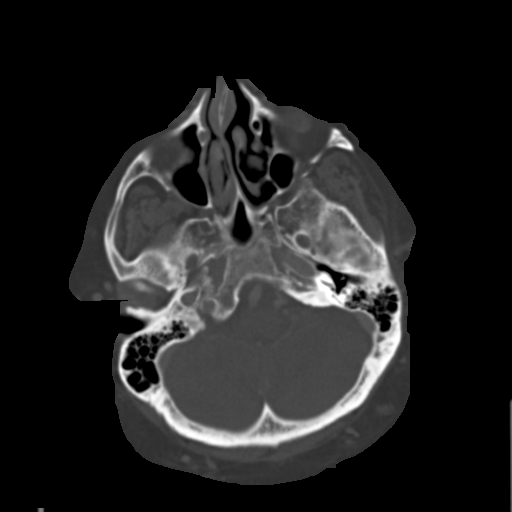

[Series 3: sag neck----- · sagittal · 0.50mm/px · 5 of 109 slices shown, 6 images]
[im 37/109  bone]
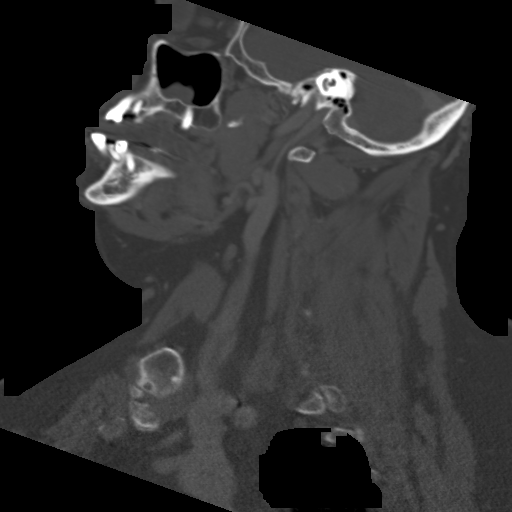
[im 46/109  bone]
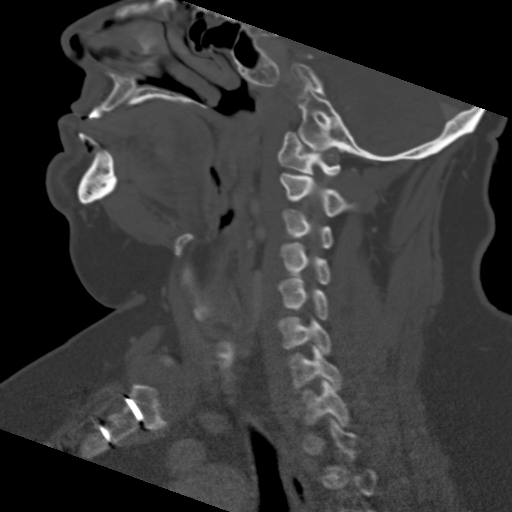
[im 55/109  soft-tissue]
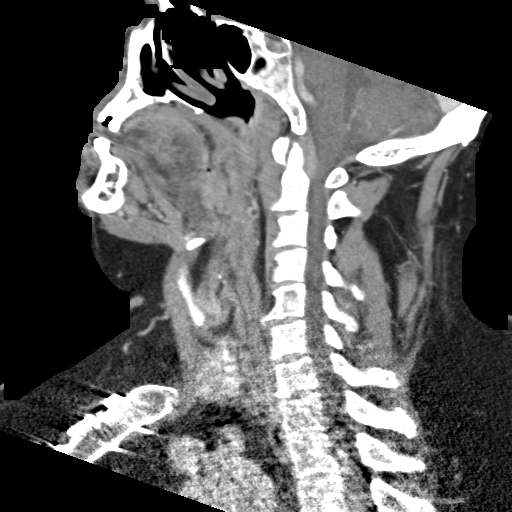
[im 55/109  bone]
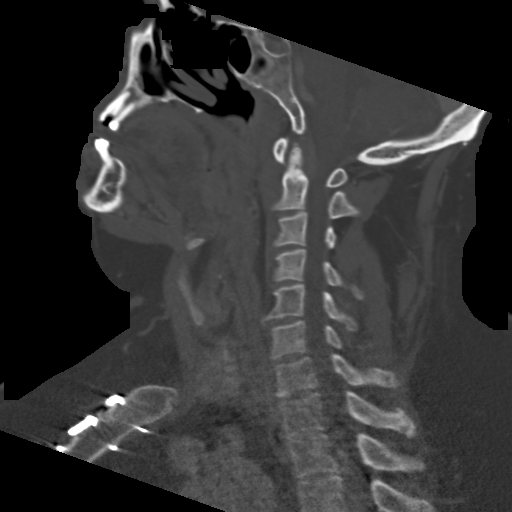
[im 64/109  bone]
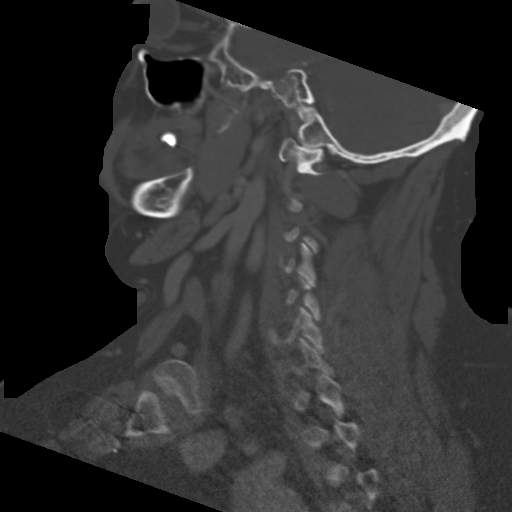
[im 73/109  bone]
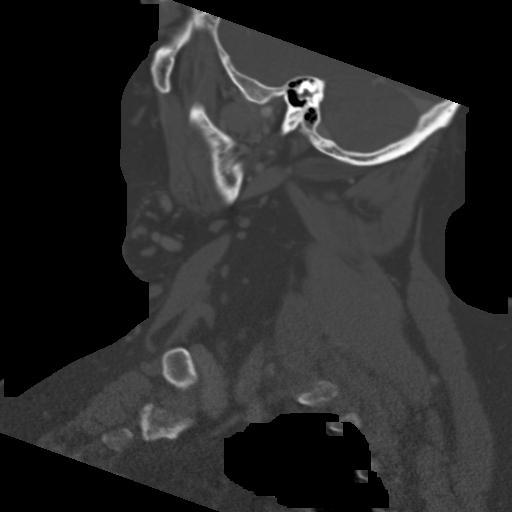

[Series 4: cor neck----- · coronal · 0.53mm/px · 3 of 105 slices shown]
[im 29/105  bone]
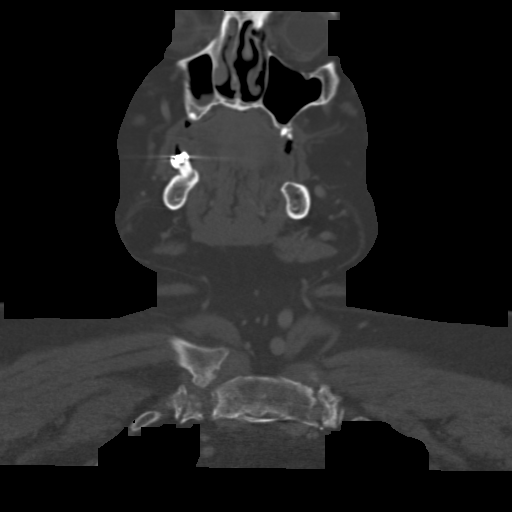
[im 45/105  bone]
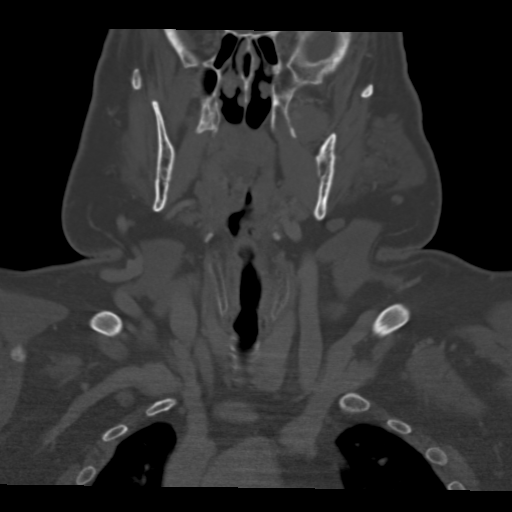
[im 61/105  bone]
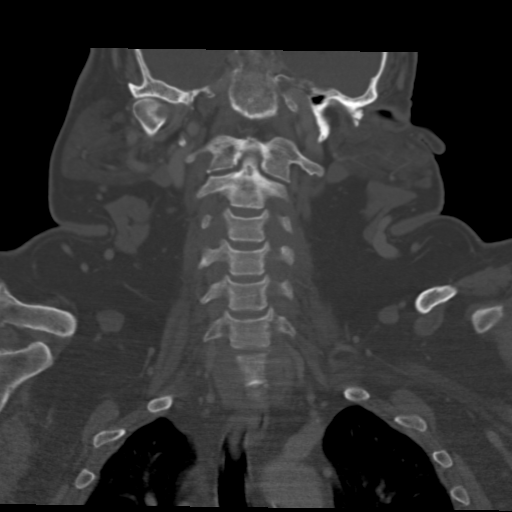

[16 of 33 positions shown; findings below may reference images not displayed]

FINDINGS: Pharynx and larynx: No pathologic enhancement or asymmetry

Salivary glands: Negative

Thyroid: Limited evaluation of the lower pole left gland due to
streak artifact. No definitive pathology.

Lymph nodes: No enlarged or necrotic appearing lymph nodes.

Vascular: Cervical carotid atherosclerosis without evidence of
stenosis or acute arterial finding. Major venous structures are
widely patent.

Limited intracranial: Negative

Visualized orbits: Cataract resection on the left. No acute finding.

Mastoids and visualized paranasal sinuses: Mild mucosal thickening,
greatest in inferior right maxillary antrum. No acute sinusitis.
Clear mastoids.

Skeleton: Hypoplastic posterior arch of C1. When combined with retro
dental ligamentous thickening there is advanced upper canal stenosis
with cord compression.

Upper chest: Reference dedicated imaging reported separately.
IMPRESSION: 1. Negative visceral neck.
2. Hypoplastic C1 ring with advanced upper canal stenosis and cord
compression. Neurosurgical referral recommended.

## 2015-06-17 MED ORDER — CLINDAMYCIN HCL 300 MG PO CAPS: 300.0000 mg | ORAL_CAPSULE | Freq: Three times a day (TID) | ORAL | Status: DC

## 2015-06-17 MED ORDER — IOHEXOL 350 MG/ML SOLN
125.0000 mL | Freq: Once | INTRAVENOUS | Status: AC | PRN
  Administered 2015-06-17: 125 mL via INTRAVENOUS

## 2015-06-17 NOTE — Discharge Instructions (Signed)
Cellulitis Cellulitis is an infection of the skin and the tissue beneath it. The infected area is usually red and tender. Cellulitis occurs most often in the arms and lower legs.  CAUSES  Cellulitis is caused by bacteria that enter the skin through cracks or cuts in the skin. The most common types of bacteria that cause cellulitis are staphylococci and streptococci. SIGNS AND SYMPTOMS   Redness and warmth.  Swelling.  Tenderness or pain.  Fever. DIAGNOSIS  Your health care provider can usually determine what is wrong based on a physical exam. Blood tests may also be done. TREATMENT  Treatment usually involves taking an antibiotic medicine. HOME CARE INSTRUCTIONS   Take your antibiotic medicine as directed by your health care provider. Finish the antibiotic even if you start to feel better.  Keep the infected arm or leg elevated to reduce swelling.  Apply a warm cloth to the affected area up to 4 times per day to relieve pain.  Take medicines only as directed by your health care provider.  Keep all follow-up visits as directed by your health care provider. SEEK MEDICAL CARE IF:   You notice red streaks coming from the infected area.  Your red area gets larger or turns dark in color.  Your bone or joint underneath the infected area becomes painful after the skin has healed.  Your infection returns in the same area or another area.  You notice a swollen bump in the infected area.  You develop new symptoms.  You have a fever. SEEK IMMEDIATE MEDICAL CARE IF:   You feel very sleepy.  You develop vomiting or diarrhea.  You have a general ill feeling (malaise) with muscle aches and pains.   This information is not intended to replace advice given to you by your health care provider. Make sure you discuss any questions you have with your health care provider.   Document Released: 05/03/2005 Document Revised: 04/14/2015 Document Reviewed: 10/09/2011 Elsevier Interactive  Patient Education 2016 [employer]   Radicular Pain Radicular pain in either the arm or leg is usually from a bulging or herniated disk in the spine. A piece of the herniated disk may press against the nerves as the nerves exit the spine. This causes pain which is felt at the tips of the nerves down the arm or leg. Other causes of radicular pain may include:  Fractures.  Heart disease.  Cancer.  An abnormal and usually degenerative state of the nervous system or nerves (neuropathy). Diagnosis may require CT or MRI scanning to determine the primary cause.  Nerves that start at the neck (nerve roots) may cause radicular pain in the outer shoulder and arm. It can spread down to the thumb and fingers. The symptoms vary depending on which nerve root has been affected. In most cases radicular pain improves with conservative treatment. Neck problems may require physical therapy, a neck collar, or cervical traction. Treatment may take many weeks, and surgery may be considered if the symptoms do not improve.  Conservative treatment is also recommended for sciatica. Sciatica causes pain to radiate from the lower back or buttock area down the leg into the foot. Often there is a history of back problems. Most patients with sciatica are better after 2 to 4 weeks of rest and other supportive care. Short term bed rest can reduce the disk pressure considerably. Sitting, however, is not a good position since this increases the pressure on the disk. You should avoid bending, lifting, and all other activities  which make the problem worse. Traction can be used in severe cases. Surgery is usually reserved for patients who do not improve within the first months of treatment. Only take over-the-counter or prescription medicines for pain, discomfort, or fever as directed by your caregiver. Narcotics and muscle relaxants may help by relieving more severe pain and spasm and by providing mild sedation. Cold or massage can  give significant relief. Spinal manipulation is not recommended. It can increase the degree of disc protrusion. Epidural steroid injections are often effective treatment for radicular pain. These injections deliver medicine to the spinal nerve in the space between the protective covering of the spinal cord and back bones (vertebrae). Your caregiver can give you more information about steroid injections. These injections are most effective when given within two weeks of the onset of pain.  You should see your caregiver for follow up care as recommended. A program for neck and back injury rehabilitation with stretching and strengthening exercises is an important part of management.  SEEK IMMEDIATE MEDICAL CARE IF:  You develop increased pain, weakness, or numbness in your arm or leg.  You develop difficulty with bladder or bowel control.  You develop abdominal pain.   This information is not intended to replace advice given to you by your health care provider. Make sure you discuss any questions you have with your health care provider.   Document Released: 08/31/2004 Document Revised: 08/14/2014 Document Reviewed: 02/17/2015 Elsevier Interactive Patient Education 2016 [employer]  Radicular Pain Radicular pain in either the arm or leg is usually from a bulging or herniated disk in the spine. A piece of the herniated disk may press against the nerves as the nerves exit the spine. This causes pain which is felt at the tips of the nerves down the arm or leg. Other causes of radicular pain may include:  Fractures.  Heart disease.  Cancer.  An abnormal and usually degenerative state of the nervous system or nerves (neuropathy). Diagnosis may require CT or MRI scanning to determine the primary cause.  Nerves that start at the neck (nerve roots) may cause radicular pain in the outer shoulder and arm. It can spread down to the thumb and fingers. The symptoms vary depending on which nerve root has  been affected. In most cases radicular pain improves with conservative treatment. Neck problems may require physical therapy, a neck collar, or cervical traction. Treatment may take many weeks, and surgery may be considered if the symptoms do not improve.  Conservative treatment is also recommended for sciatica. Sciatica causes pain to radiate from the lower back or buttock area down the leg into the foot. Often there is a history of back problems. Most patients with sciatica are better after 2 to 4 weeks of rest and other supportive care. Short term bed rest can reduce the disk pressure considerably. Sitting, however, is not a good position since this increases the pressure on the disk. You should avoid bending, lifting, and all other activities which make the problem worse. Traction can be used in severe cases. Surgery is usually reserved for patients who do not improve within the first months of treatment. Only take over-the-counter or prescription medicines for pain, discomfort, or fever as directed by your caregiver. Narcotics and muscle relaxants may help by relieving more severe pain and spasm and by providing mild sedation. Cold or massage can give significant relief. Spinal manipulation is not recommended. It can increase the degree of disc protrusion. Epidural steroid injections are often effective  treatment for radicular pain. These injections deliver medicine to the spinal nerve in the space between the protective covering of the spinal cord and back bones (vertebrae). Your caregiver can give you more information about steroid injections. These injections are most effective when given within two weeks of the onset of pain.  You should see your caregiver for follow up care as recommended. A program for neck and back injury rehabilitation with stretching and strengthening exercises is an important part of management.  SEEK IMMEDIATE MEDICAL CARE IF:  You develop increased pain, weakness, or numbness  in your arm or leg.  You develop difficulty with bladder or bowel control.  You develop abdominal pain.   This information is not intended to replace advice given to you by your health care provider. Make sure you discuss any questions you have with your health care provider.   Document Released: 08/31/2004 Document Revised: 08/14/2014 Document Reviewed: 02/17/2015 Elsevier Interactive Patient Education 2016 [employer].  Chartered certified accountant Patient Education [employer].  Serous Otitis Media Serous otitis media is fluid in the middle ear space. This space contains the bones for hearing and air. Air in the middle ear space helps to transmit sound.  The air gets there through the eustachian tube. This tube goes from the back of the nose (nasopharynx) to the middle ear space. It keeps the pressure in the middle ear the same as the outside world. It also helps to drain fluid from the middle ear space. CAUSES  Serous otitis media occurs when the eustachian tube gets blocked. Blockage can come from:  Ear infections.  Colds and other upper respiratory infections.  Allergies.  Irritants such as cigarette smoke.  Sudden changes in air pressure (such as descending in an airplane).  Enlarged adenoids.  A mass in the nasopharynx. During colds and upper respiratory infections, the middle ear space can become temporarily filled with fluid. This can happen after an ear infection also. Once the infection clears, the fluid will generally drain out of the ear through the eustachian tube. If it does not, then serous otitis media occurs. SIGNS AND SYMPTOMS   Hearing loss.  A feeling of fullness in the ear, without pain.  Young children may not show any symptoms but may show slight behavioral changes, such as agitation, ear pulling, or crying. DIAGNOSIS  Serous otitis media is diagnosed by an ear exam. Tests may be done to check on the movement of the eardrum. Hearing exams may also be  done. TREATMENT  The fluid most often goes away without treatment. If allergy is the cause, allergy treatment may be helpful. Fluid that persists for several months may require minor surgery. A small tube is placed in the eardrum to:  Drain the fluid.  Restore the air in the middle ear space. In certain situations, antibiotic medicines are used to avoid surgery. Surgery may be done to remove enlarged adenoids (if this is the cause). HOME CARE INSTRUCTIONS   Keep children away from tobacco smoke.  Keep all follow-up visits as directed by your health care provider. SEEK MEDICAL CARE IF:   Your hearing is not better in 3 months.  Your hearing is worse.  You have ear pain.  You have drainage from the ear.  You have dizziness.  You have serous otitis media only in one ear or have any bleeding from your nose (epistaxis).  You notice a lump on your neck. MAKE SURE YOU:  Understand these instructions.   Will watch your  condition.   Will get help right away if you are not doing well or get worse.    This information is not intended to replace advice given to you by your health care provider. Make sure you discuss any questions you have with your health care provider.   Document Released: 10/14/2003 Document Revised: 08/14/2014 Document Reviewed: 02/18/2013 Elsevier Interactive Patient Education [employer].

## 2015-06-17 NOTE — ED Notes (Signed)
Patient discharged to home per MD order. Patient in stable condition, and deemed medically cleared by ED provider for discharge. Discharge instructions reviewed with patient/family using "Teach Back"; verbalized understanding of medication education and administration, and information about follow-up care. Denies further concerns. ° °

## 2015-06-17 NOTE — ED Provider Notes (Signed)
I assumed care of the patient from Dr. Rip Harbour at 12:00 AM. Patient's CT scans revealed     CT Head Wo Contrast (Final result) Result time: 06/17/15 01:58:03   Final result by Rad Results In Interface (06/17/15 01:58:03)   Narrative:   CLINICAL DATA: Vertigo. Headache.  EXAM: CT HEAD WITHOUT CONTRAST  TECHNIQUE: Contiguous axial images were obtained from the base of the skull through the vertex without intravenous contrast.  COMPARISON: Head CT 10/12/2012  FINDINGS: No intracranial hemorrhage, mass effect, or midline shift. No hydrocephalus. The basilar cisterns are patent. No evidence of territorial infarct. No intracranial fluid collection. Calvarium is intact. Included paranasal sinuses and mastoid air cells are well aerated.  IMPRESSION: No acute intracranial abnormality.   Electronically Signed By: Jeb Levering M.D. On: 06/17/2015 01:58          CT Angio Chest PE W/Cm &/Or Wo Cm (Final result) Result time: 06/17/15 02:07:34   Final result by Rad Results In Interface (06/17/15 02:07:34)   Narrative:   CLINICAL DATA: Shortness of breath.  EXAM: CT ANGIOGRAPHY CHEST WITH CONTRAST  TECHNIQUE: Multidetector CT imaging of the chest was performed using the standard protocol during bolus administration of intravenous contrast. Multiplanar CT image reconstructions and MIPs were obtained to evaluate the vascular anatomy.  CONTRAST: 170mL OMNIPAQUE IOHEXOL 350 MG/ML SOLN  COMPARISON: Chest radiograph 1 day prior. Chest CT PE protocol 01/21/201  FINDINGS: No filling defects in the central pulmonary arteries to suggest pulmonary embolus, the subsegmental branches cannot be assessed due to contrast bolus timing and soft tissue attenuation from body habitus.  Mild cardiomegaly. Generalized mediastinal lipomatosis. Thoracic aorta is normal in caliber with scattered atherosclerosis. No aneurysm or dissection. Patient is post CABG. No  mediastinal or hilar adenopathy. No pleural or pericardial effusion.  Lungs clear without consolidation or pulmonary edema. Linear atelectasis in the right middle lobe. Minimal subpleural scarring in the anterior right upper lobe. No suspicious nodule or pulmonary mass.  Small hiatal hernia and minimal thickening of the distal esophagus. Dependent gallstone again seen in the gallbladder. Liver is prominent size with steatosis.  There are no acute or suspicious osseous abnormalities. Multilevel dorsal spurring throughout the thoracic spine hemangioma suspected within T10 vertebral body. Post median sternotomy.  Review of the MIP images confirms the above findings.  IMPRESSION: 1. No pulmonary embolus to the level of the subsegmental arteries. 2. No acute intrathoracic process.   Electronically Signed By: Jeb Levering M.D. On: 06/17/2015 02:07          CT Soft Tissue Neck W Contrast (Final result) Result time: 06/17/15 02:13:34   Final result by Rad Results In Interface (06/17/15 02:13:34)   Narrative:   CLINICAL DATA: Episodes of neck pain with hoarseness and chest pain.  EXAM: CT NECK WITH CONTRAST  TECHNIQUE: Multidetector CT imaging of the neck was performed using the standard protocol following the bolus administration of intravenous contrast.  CONTRAST: 171mL OMNIPAQUE IOHEXOL 350 MG/ML SOLN  COMPARISON: None.  FINDINGS: Pharynx and larynx: No pathologic enhancement or asymmetry  Salivary glands: Negative  Thyroid: Limited evaluation of the lower pole left gland due to streak artifact. No definitive pathology.  Lymph nodes: No enlarged or necrotic appearing lymph nodes.  Vascular: Cervical carotid atherosclerosis without evidence of stenosis or acute arterial finding. Major venous structures are widely patent.  Limited intracranial: Negative  Visualized orbits: Cataract resection on the left. No acute finding.  Mastoids and  visualized paranasal sinuses: Mild mucosal thickening, greatest in inferior right maxillary  antrum. No acute sinusitis. Clear mastoids.  Skeleton: Hypoplastic posterior arch of C1. When combined with retro dental ligamentous thickening there is advanced upper canal stenosis with cord compression.  Upper chest: Reference dedicated imaging reported separately.  IMPRESSION: 1. Negative visceral neck. 2. 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 Neurosurgical referral recommended.   Electronically Signed By: Monte Fantasia M.D. On: 06/17/2015 02:13          US Venous Img Lower Unilateral Right (Final result) Result time: 06/17/15 00:55:19   Final result by Rad Results In Interface (06/17/15 00:55:19)   Narrative:   CLINICAL DATA: Right leg edema and redness for 1 year  EXAM: Right LOWER EXTREMITY VENOUS DOPPLER ULTRASOUND  TECHNIQUE: Gray-scale sonography with graded compression, as well as color Doppler and duplex ultrasound were performed to evaluate the lower extremity deep venous systems from the level of the common femoral vein and including the common femoral, femoral, profunda femoral, popliteal and calf veins including the posterior tibial, peroneal and gastrocnemius veins when visible. The superficial great saphenous vein was also interrogated. Spectral Doppler was utilized to evaluate flow at rest and with distal augmentation maneuvers in the common femoral, femoral and popliteal veins.  COMPARISON: 08/27/2014  FINDINGS: Contralateral Common Femoral Vein: Respiratory phasicity is normal and symmetric with the symptomatic side. No evidence of thrombus. Normal compressibility.  Common Femoral Vein: No evidence of thrombus.  Saphenofemoral Junction: No evidence of thrombus.  Profunda Femoral Vein: No evidence of thrombus.  Femoral Vein: No evidence of thrombus.  Popliteal Vein: No evidence of thrombus.  Calf  Veins: No evidence of thrombus.  IMPRESSION: No evidence of right lower extremity deep venous thrombosis.   Electronically Signed By: Monte Fantasia M.D. On: 06/17/2015 00:55          DG Chest 2 View (Final result) Result time: 06/16/15 22:53:19   Final result by Rad Results In Interface (06/16/15 22:53:19)   Narrative:   CLINICAL DATA: Globus sensation for 3 weeks. Difficulty breathing.  EXAM: CHEST 2 VIEW  COMPARISON: 04/01/2015  FINDINGS: The heart is mildly enlarged but stable. Stable surgical changes from bypass surgery. The lungs are clear. Stable eventration of the right hemidiaphragm. No pleural effusion. The bony thorax is intact. Stable degenerative changes involving the thoracic spine.  IMPRESSION: Mild stable cardiac enlargement. No acute pulmonary findings.   Electronically Signed By: Marijo Sanes M.D. On: 06/16/2015 22:53      Patient referred to Dr. Cyndy Freeze neurosurgeon.  Gregor Hams, MD 06/17/15 [phone]

## 2015-06-17 NOTE — ED Notes (Signed)
Pt to ct 

## 2015-06-22 ENCOUNTER — Ambulatory Visit
Admission: RE | Admit: 2015-06-22 | Discharge: 2015-06-22 | Disposition: A | Discharge status: Home / Self Care | Payer: Medicare (Managed Care) | Source: Ambulatory Visit | Attending: Geriatric Medicine | Admitting: Geriatric Medicine

## 2015-06-22 DIAGNOSIS — K8020 Calculus of gallbladder without cholecystitis without obstruction: Secondary | ICD-10-CM | POA: Diagnosis not present

## 2015-06-22 DIAGNOSIS — R1011 Right upper quadrant pain: Secondary | ICD-10-CM

## 2015-07-13 ENCOUNTER — Ambulatory Visit: Payer: Medicare Other | Admitting: Family

## 2015-09-27 IMAGING — US US EXTREM LOW VENOUS*R*
1 series · 13 of 24 positions shown · non-contrast
Comparison: [DATE]

CLINICAL DATA: Right leg edema and redness for 1 year



[Series 1: us extrem low venous*right* · 0.09mm/px · 13 of 36 slices shown]
[im 1/36]
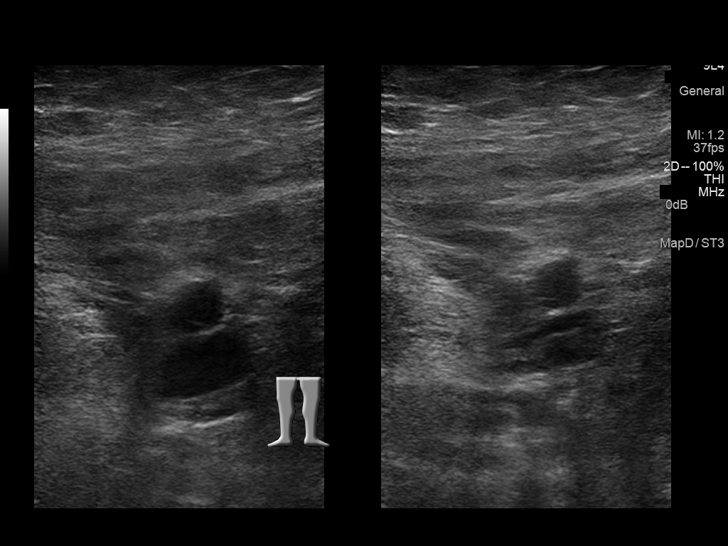
[im 4/36]
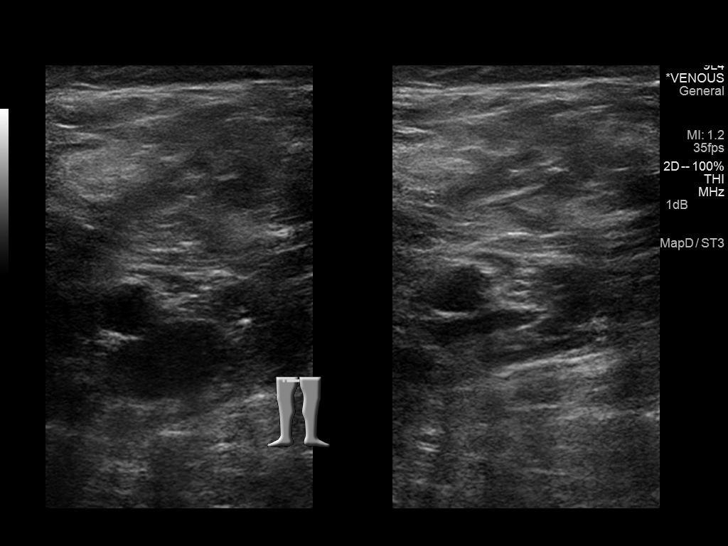
[im 7/36]
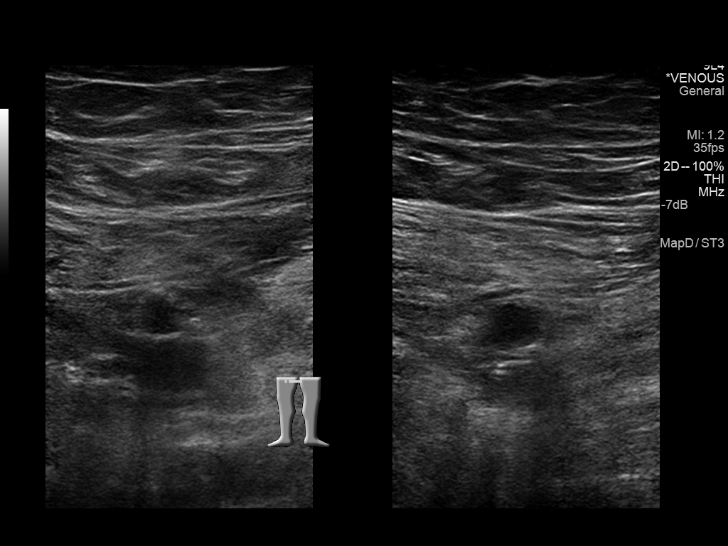
[im 10/36]
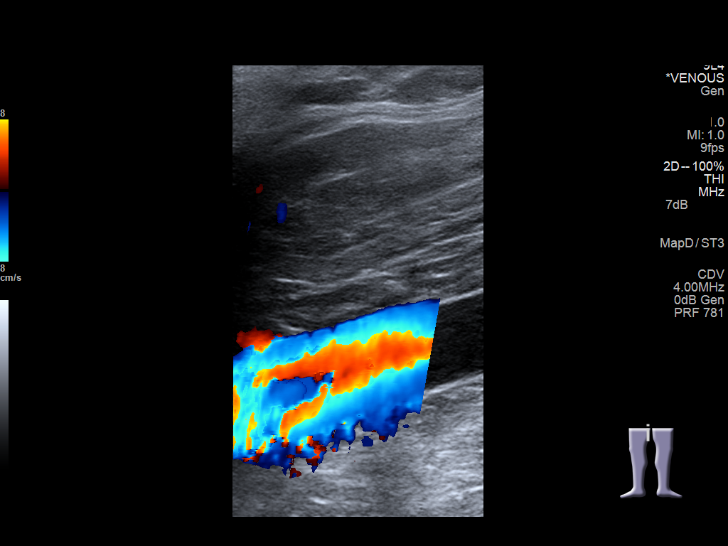
[im 13/36]
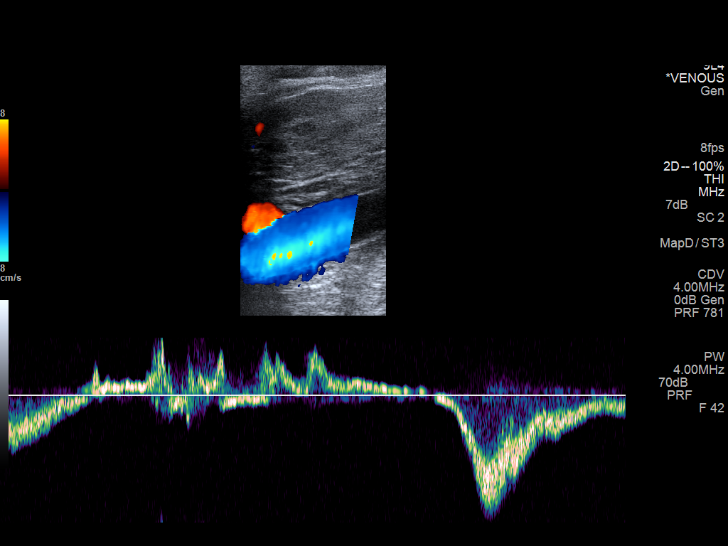
[im 16/36]
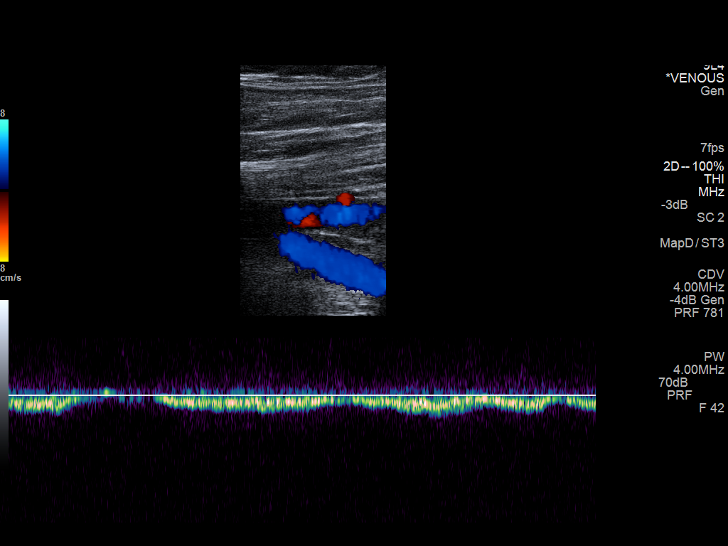
[im 19/36]
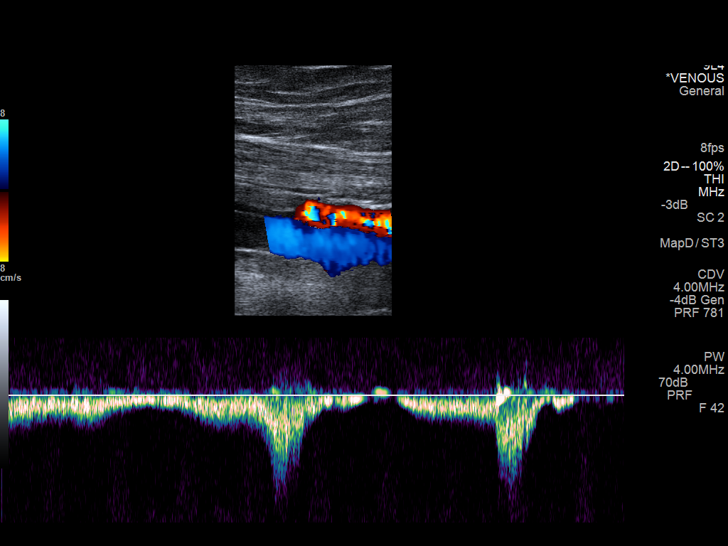
[im 20/36]
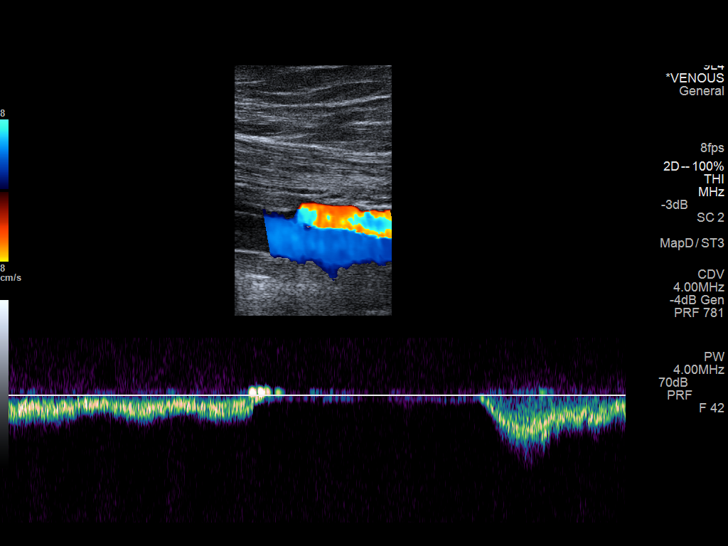
[im 23/36]
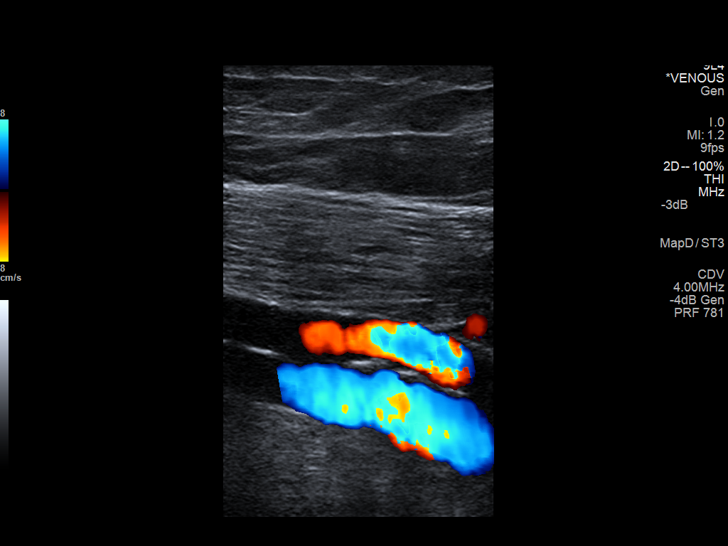
[im 26/36]
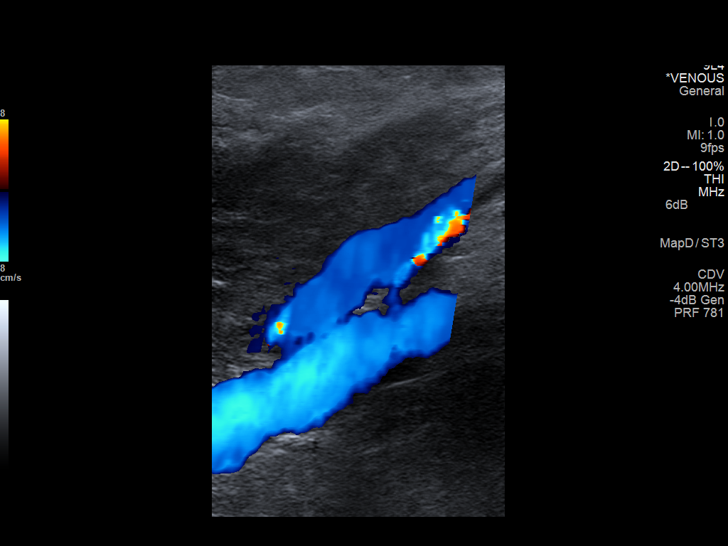
[im 29/36]
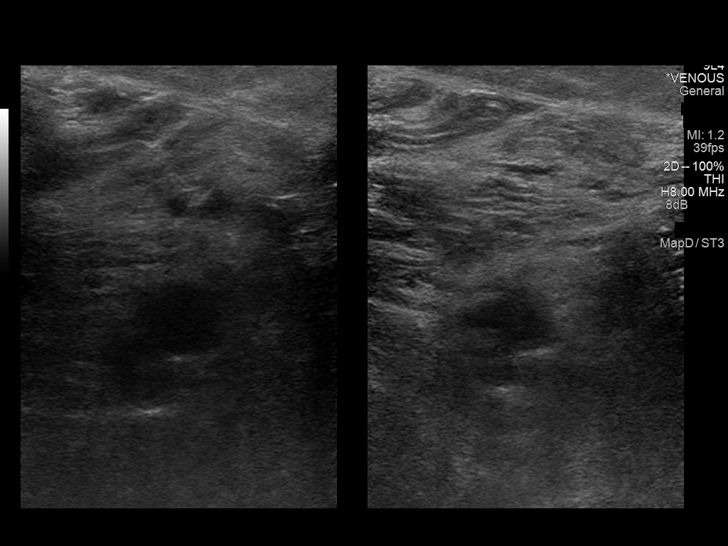
[im 32/36]
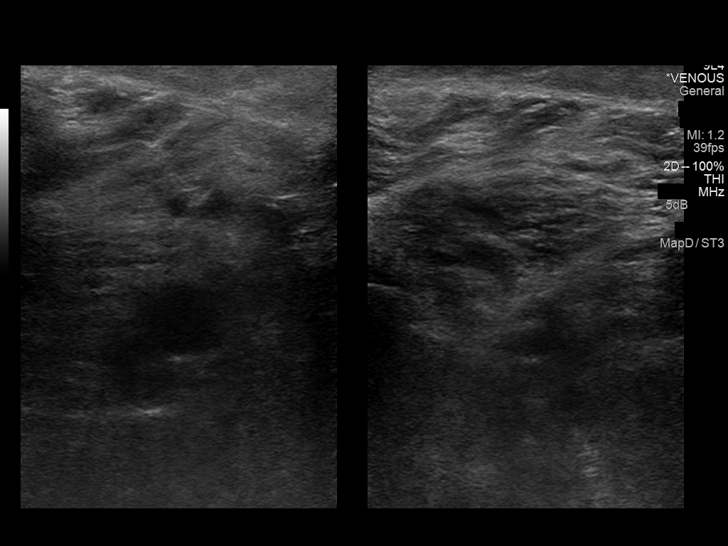
[im 36/36]
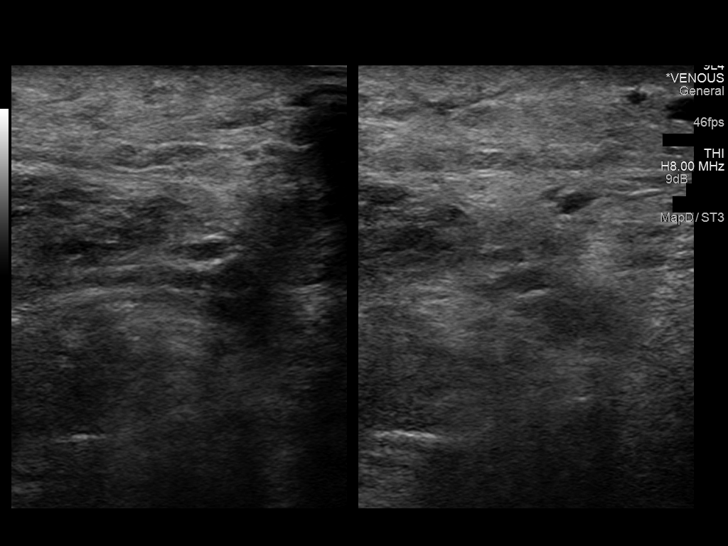

[13 of 24 positions shown; findings below may reference images not displayed]

FINDINGS: Contralateral Common Femoral Vein: Respiratory phasicity is normal
and symmetric with the symptomatic side. No evidence of thrombus.
Normal compressibility.

Common Femoral Vein: No evidence of thrombus.

Saphenofemoral Junction: No evidence of thrombus.

Profunda Femoral Vein: No evidence of thrombus.

Femoral Vein: No evidence of thrombus.

Popliteal Vein: No evidence of thrombus.

Calf Veins: No evidence of thrombus.
IMPRESSION: No evidence of right lower extremity deep venous thrombosis.

## 2015-10-02 IMAGING — US US ABDOMEN COMPLETE
1 series · 13 of 25 positions shown · non-contrast
Comparison: Abdominal ultrasound [DATE] and abdominal
and pelvic CT scan [DATE].

CLINICAL DATA: Eight months of right upper quadrant pain

EXAM:
ULTRASOUND ABDOMEN COMPLETE

[Series 1: us abdomen complete · 0.31mm/px · 13 of 81 slices shown]
[im 1/81]
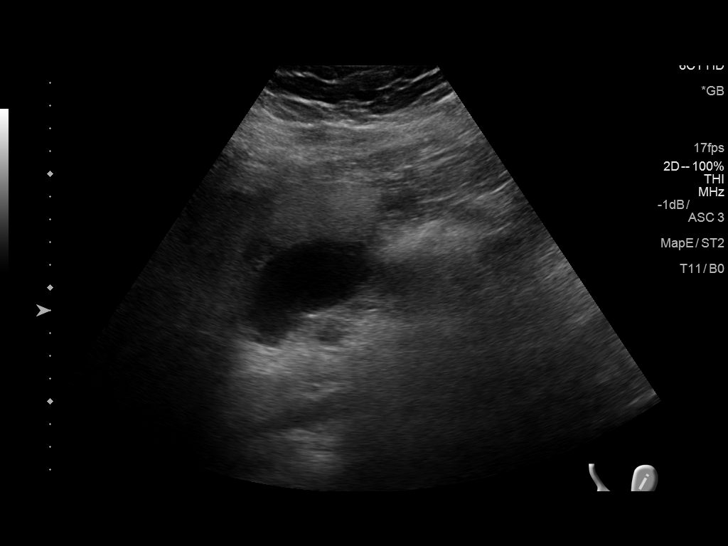
[im 7/81]
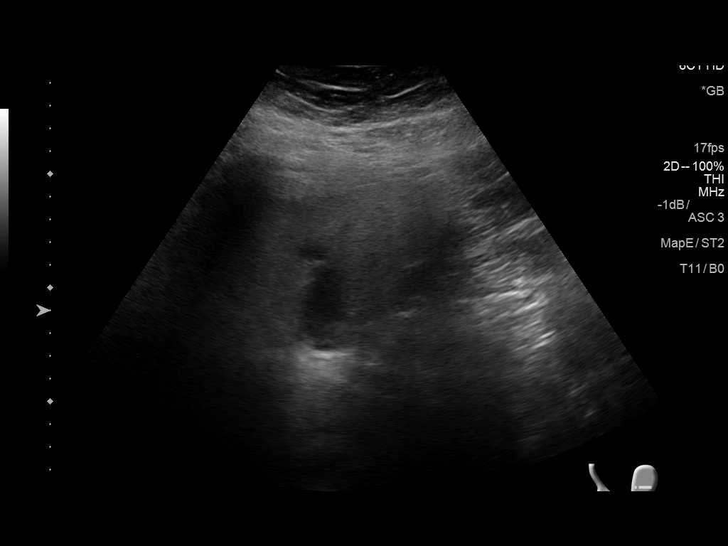
[im 14/81]
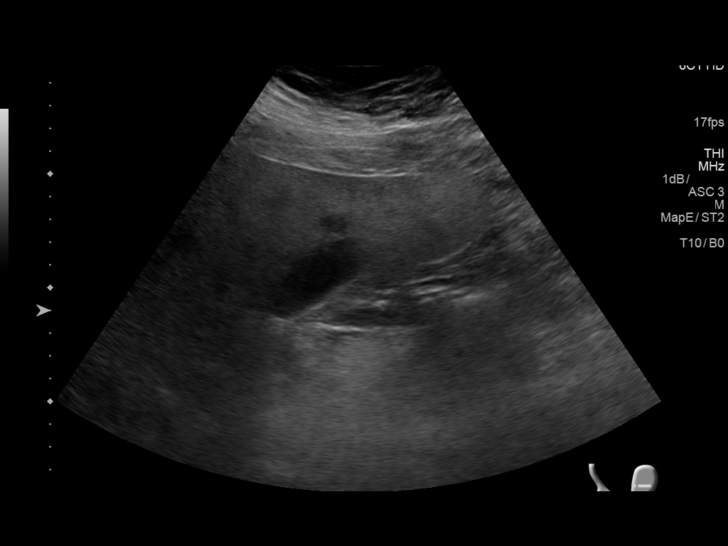
[im 21/81]
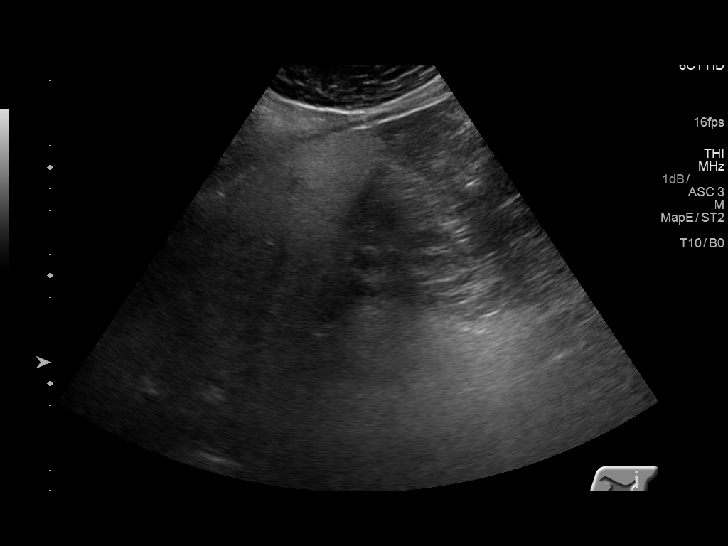
[im 27/81]
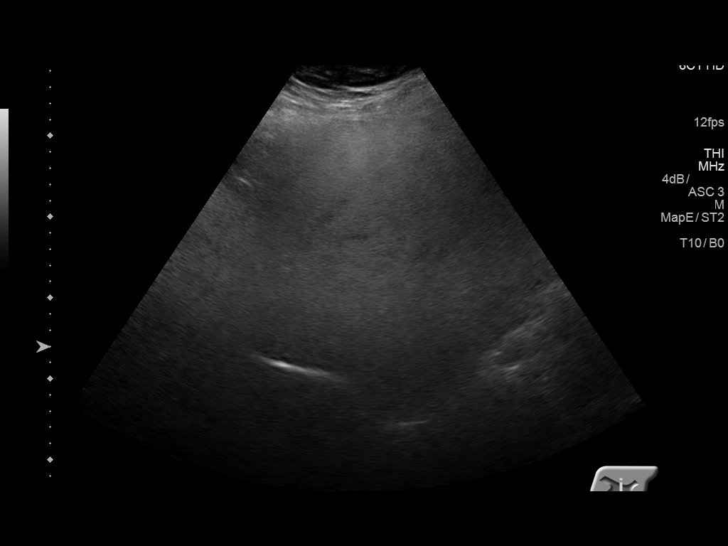
[im 34/81]
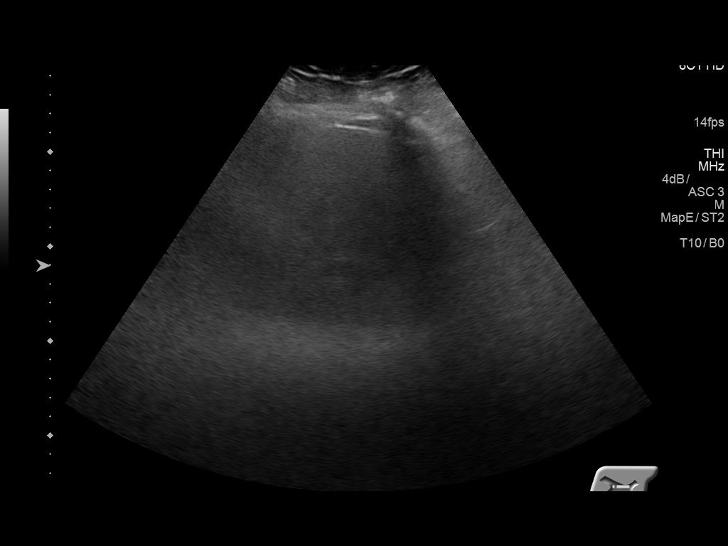
[im 41/81]
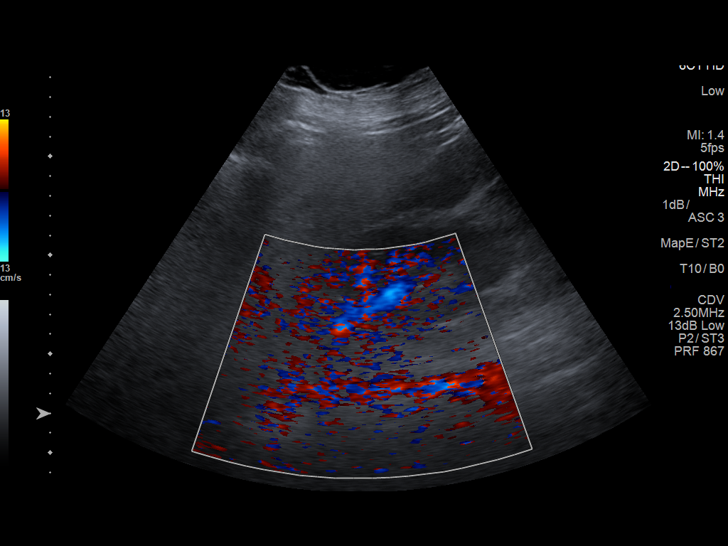
[im 47/81]
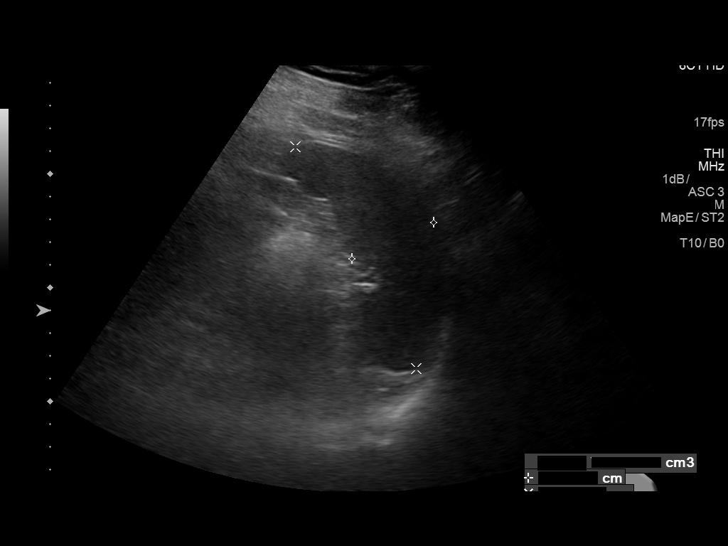
[im 54/81]
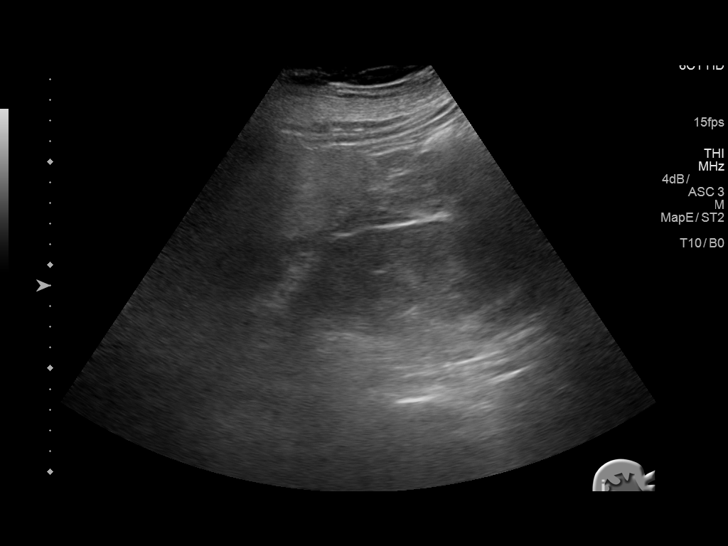
[im 61/81]
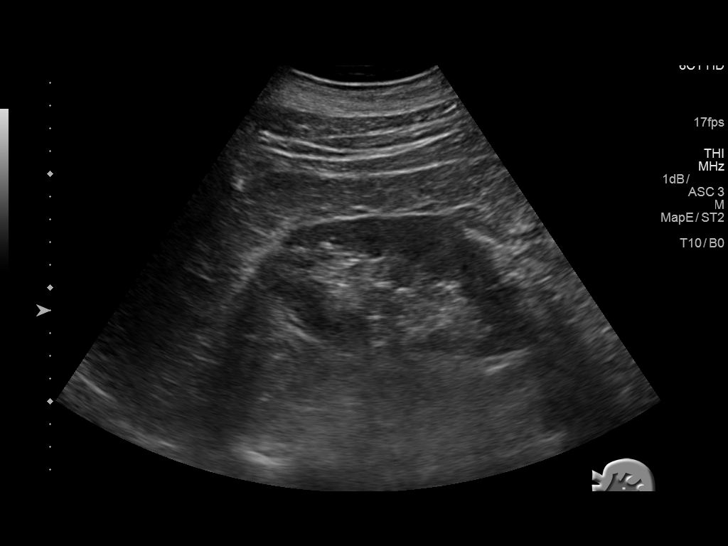
[im 67/81]
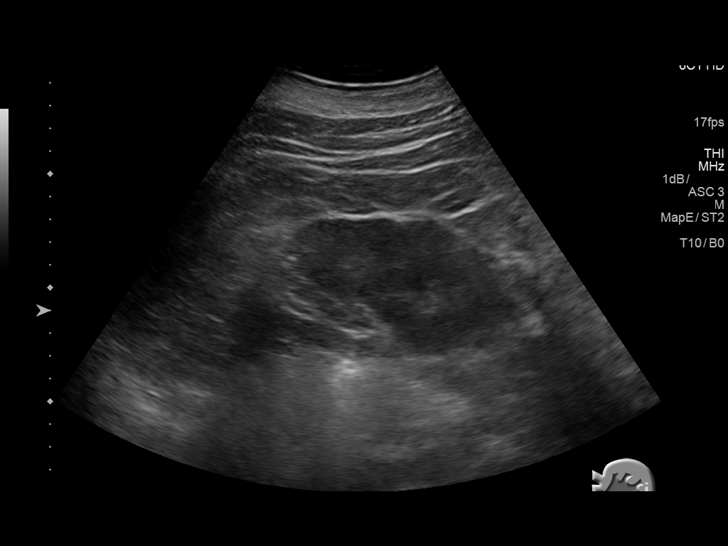
[im 74/81]
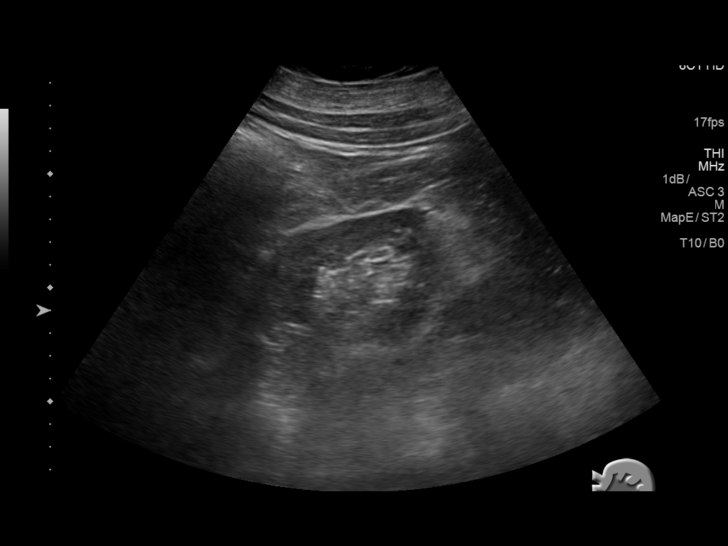
[im 81/81]
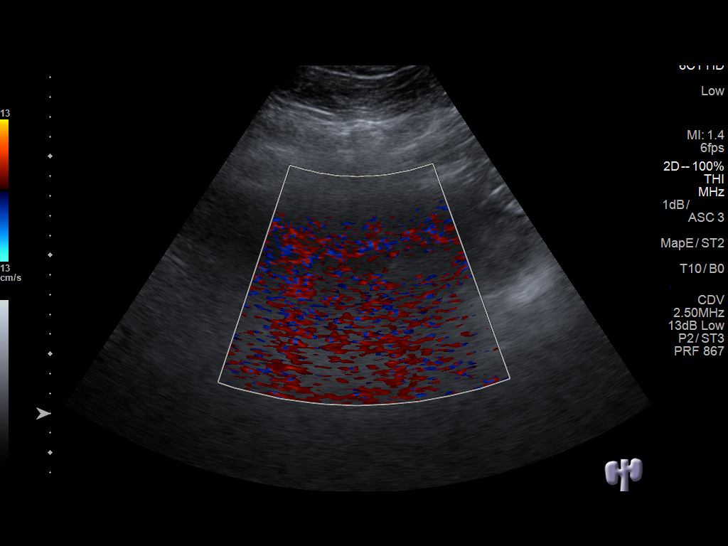

[13 of 25 positions shown; findings below may reference images not displayed]

FINDINGS: Gallbladder: There is a 5 mm echogenic mobile shadowing stone. There
is no gallbladder wall thickening, pericholecystic fluid, or
positive sonographic Murphy's sign.

Common bile duct: Diameter: 3.5 mm

Liver: The echotexture of the liver is increased. It was difficult
to penetrate the organ with the ultrasound beam.

IVC: No abnormality visualized.

Pancreas: The pancreas was obscured by bowel gas.

Spleen: Size and appearance within normal limits.

Right Kidney: Length: 13.6 cm. Echogenicity within normal limits. No
mass or hydronephrosis visualized.

Left Kidney: Length: 12.3 cm. Echogenicity within normal limits. No
mass or hydronephrosis visualized.

Abdominal aorta: The proximal and mid aorta are unremarkable. The
mid aorta was obscured by bowel gas peer

Other findings: No ascites is observed.
IMPRESSION: 1. Solitary 5 mm gallstone without sonographic evidence of acute
cholecystitis.
2. Diffusely increased hepatic echotexture consistent with fatty
infiltrative change. No hepatic mass was observed but fine detail of
the organ was limited. The pancreas was obscured by bowel gas.
3. No acute abnormality of either kidney.

## 2015-11-20 ENCOUNTER — Emergency Department: Payer: Medicare (Managed Care)

## 2015-11-20 ENCOUNTER — Emergency Department
Admission: EM | Admit: 2015-11-20 | Discharge: 2015-11-20 | Disposition: A | Discharge status: Home / Self Care | Payer: Medicare (Managed Care) | Attending: Emergency Medicine | Admitting: Emergency Medicine

## 2015-11-20 ENCOUNTER — Encounter: Payer: Self-pay | Admitting: Emergency Medicine

## 2015-11-20 DIAGNOSIS — Z7984 Long term (current) use of oral hypoglycemic drugs: Secondary | ICD-10-CM | POA: Insufficient documentation

## 2015-11-20 DIAGNOSIS — I872 Venous insufficiency (chronic) (peripheral): Secondary | ICD-10-CM | POA: Diagnosis not present

## 2015-11-20 DIAGNOSIS — Z8679 Personal history of other diseases of the circulatory system: Secondary | ICD-10-CM | POA: Diagnosis not present

## 2015-11-20 DIAGNOSIS — E785 Hyperlipidemia, unspecified: Secondary | ICD-10-CM | POA: Diagnosis not present

## 2015-11-20 DIAGNOSIS — E119 Type 2 diabetes mellitus without complications: Secondary | ICD-10-CM | POA: Diagnosis not present

## 2015-11-20 DIAGNOSIS — J45909 Unspecified asthma, uncomplicated: Secondary | ICD-10-CM | POA: Diagnosis not present

## 2015-11-20 DIAGNOSIS — Z79899 Other long term (current) drug therapy: Secondary | ICD-10-CM | POA: Diagnosis not present

## 2015-11-20 DIAGNOSIS — Z7982 Long term (current) use of aspirin: Secondary | ICD-10-CM | POA: Insufficient documentation

## 2015-11-20 DIAGNOSIS — J449 Chronic obstructive pulmonary disease, unspecified: Secondary | ICD-10-CM | POA: Diagnosis not present

## 2015-11-20 DIAGNOSIS — I110 Hypertensive heart disease with heart failure: Secondary | ICD-10-CM | POA: Insufficient documentation

## 2015-11-20 DIAGNOSIS — Z6839 Body mass index (BMI) 39.0-39.9, adult: Secondary | ICD-10-CM | POA: Insufficient documentation

## 2015-11-20 DIAGNOSIS — R2241 Localized swelling, mass and lump, right lower limb: Secondary | ICD-10-CM | POA: Diagnosis present

## 2015-11-20 DIAGNOSIS — I252 Old myocardial infarction: Secondary | ICD-10-CM | POA: Insufficient documentation

## 2015-11-20 DIAGNOSIS — I5032 Chronic diastolic (congestive) heart failure: Secondary | ICD-10-CM | POA: Diagnosis not present

## 2015-11-20 DIAGNOSIS — Z951 Presence of aortocoronary bypass graft: Secondary | ICD-10-CM | POA: Diagnosis not present

## 2015-11-20 DIAGNOSIS — L03115 Cellulitis of right lower limb: Secondary | ICD-10-CM | POA: Insufficient documentation

## 2015-11-20 LAB — CBC WITH DIFFERENTIAL/PLATELET
Basophils Absolute: 0.1 10*3/uL (ref 0–0.1)
Basophils Relative: 1 %
Eosinophils Absolute: 0.4 10*3/uL (ref 0–0.7)
Eosinophils Relative: 3 %
HCT: 41.2 % (ref 40.0–52.0)
Hemoglobin: 13.8 g/dL (ref 13.0–18.0)
Lymphocytes Relative: 20 %
Lymphs Abs: 2.6 10*3/uL (ref 1.0–3.6)
MCH: 28.9 pg (ref 26.0–34.0)
MCHC: 33.4 g/dL (ref 32.0–36.0)
MCV: 86.4 fL (ref 80.0–100.0)
Monocytes Absolute: 1.2 10*3/uL — ABNORMAL HIGH (ref 0.2–1.0)
Monocytes Relative: 9 %
Neutro Abs: 8.8 10*3/uL — ABNORMAL HIGH (ref 1.4–6.5)
Neutrophils Relative %: 67 %
Platelets: 285 10*3/uL (ref 150–440)
RBC: 4.77 MIL/uL (ref 4.40–5.90)
RDW: 16.4 % — ABNORMAL HIGH (ref 11.5–14.5)
WBC: 13.1 10*3/uL — ABNORMAL HIGH (ref 3.8–10.6)

## 2015-11-20 LAB — COMPREHENSIVE METABOLIC PANEL
ALT: 33 U/L (ref 17–63)
AST: 32 U/L (ref 15–41)
Albumin: 4.6 g/dL (ref 3.5–5.0)
Alkaline Phosphatase: 89 U/L (ref 38–126)
Anion gap: 7 (ref 5–15)
BUN: 17 mg/dL (ref 6–20)
CO2: 24 mmol/L (ref 22–32)
Calcium: 9.3 mg/dL (ref 8.9–10.3)
Chloride: 104 mmol/L (ref 101–111)
Creatinine, Ser: 0.96 mg/dL (ref 0.61–1.24)
GFR calc Af Amer: >60 mL/min (ref >60)
GFR calc non Af Amer: >60 mL/min (ref >60)
Glucose, Bld: 125 mg/dL — ABNORMAL HIGH (ref 65–99)
Potassium: 4 mmol/L (ref 3.5–5.1)
Sodium: 135 mmol/L (ref 135–145)
Total Bilirubin: 0.6 mg/dL (ref 0.3–1.2)
Total Protein: 8.6 g/dL — ABNORMAL HIGH (ref 6.5–8.1)

## 2015-11-20 IMAGING — DX DG TIBIA/FIBULA 2V*R*
4 series · 4 of 4 positions shown · non-contrast
Comparison: Right tibia/fibula CT performed [DATE]

CLINICAL DATA: Acute onset of right lower extremity swelling and
erythema. Right lower leg pain. Initial encounter.

EXAM:
RIGHT TIBIA AND FIBULA - 2 VIEW

[tibia ap (1 of 2)]
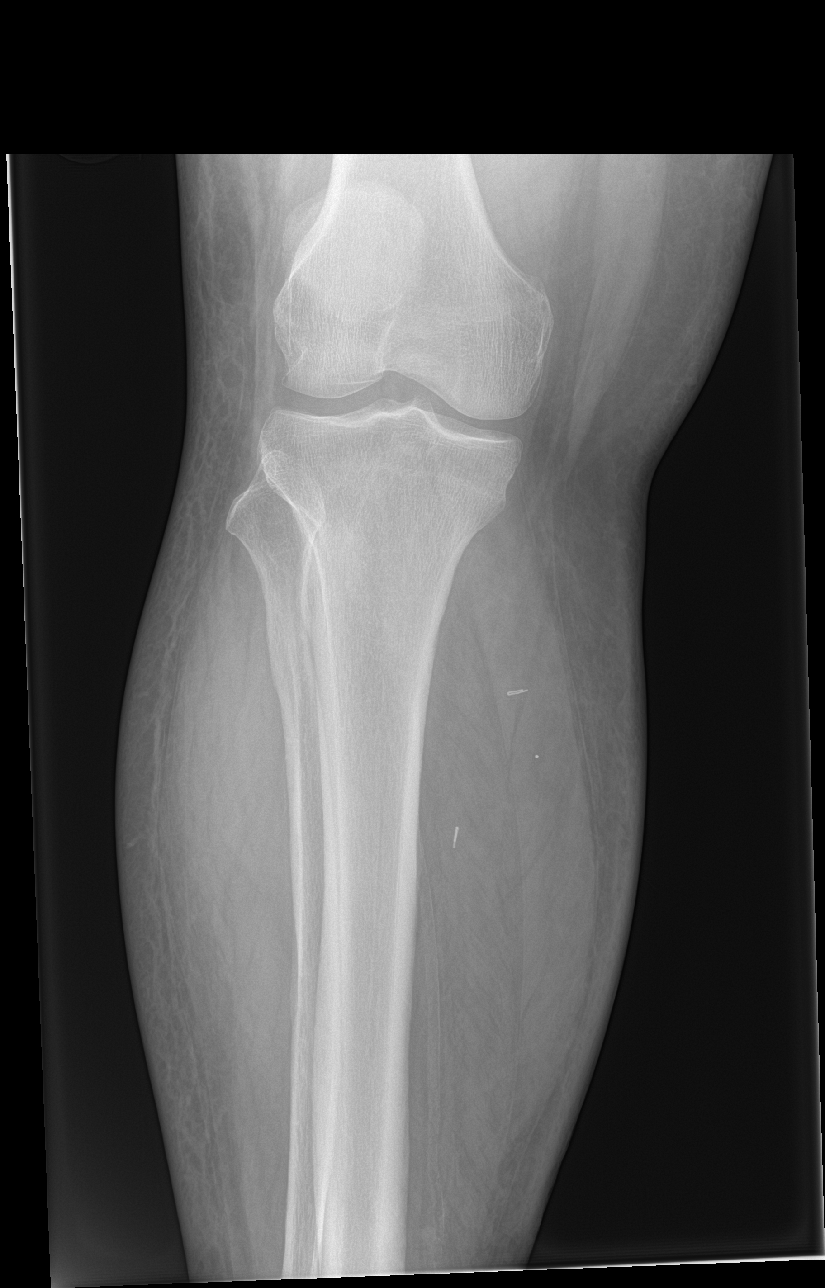

[tibia ap (2 of 2)]
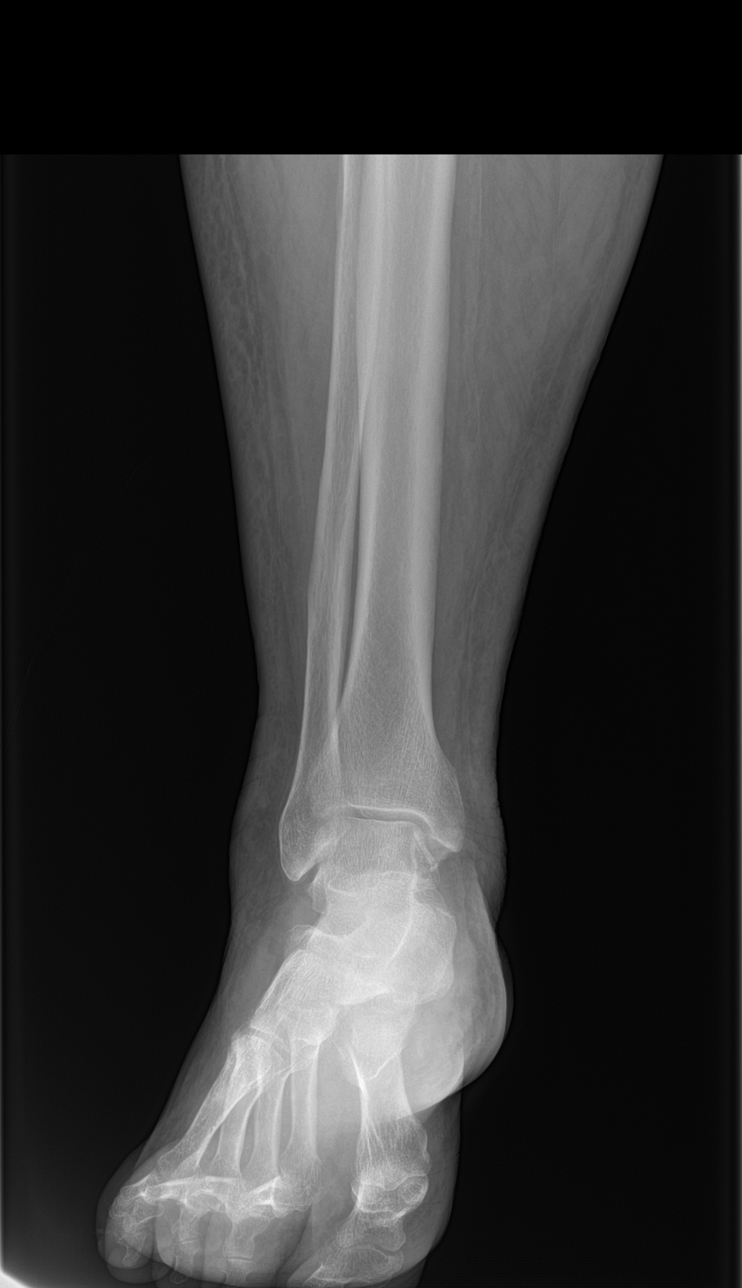

[tibia lat (1 of 2)]
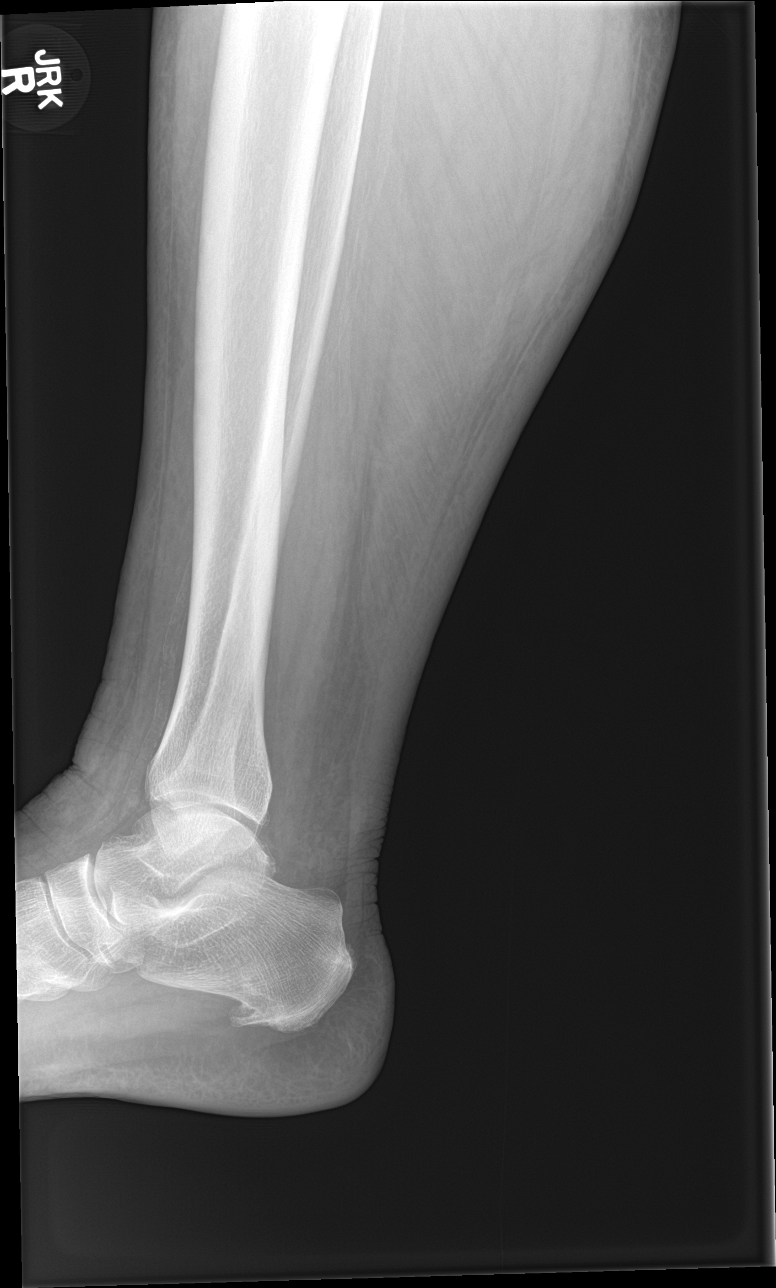

[tibia lat (2 of 2)]
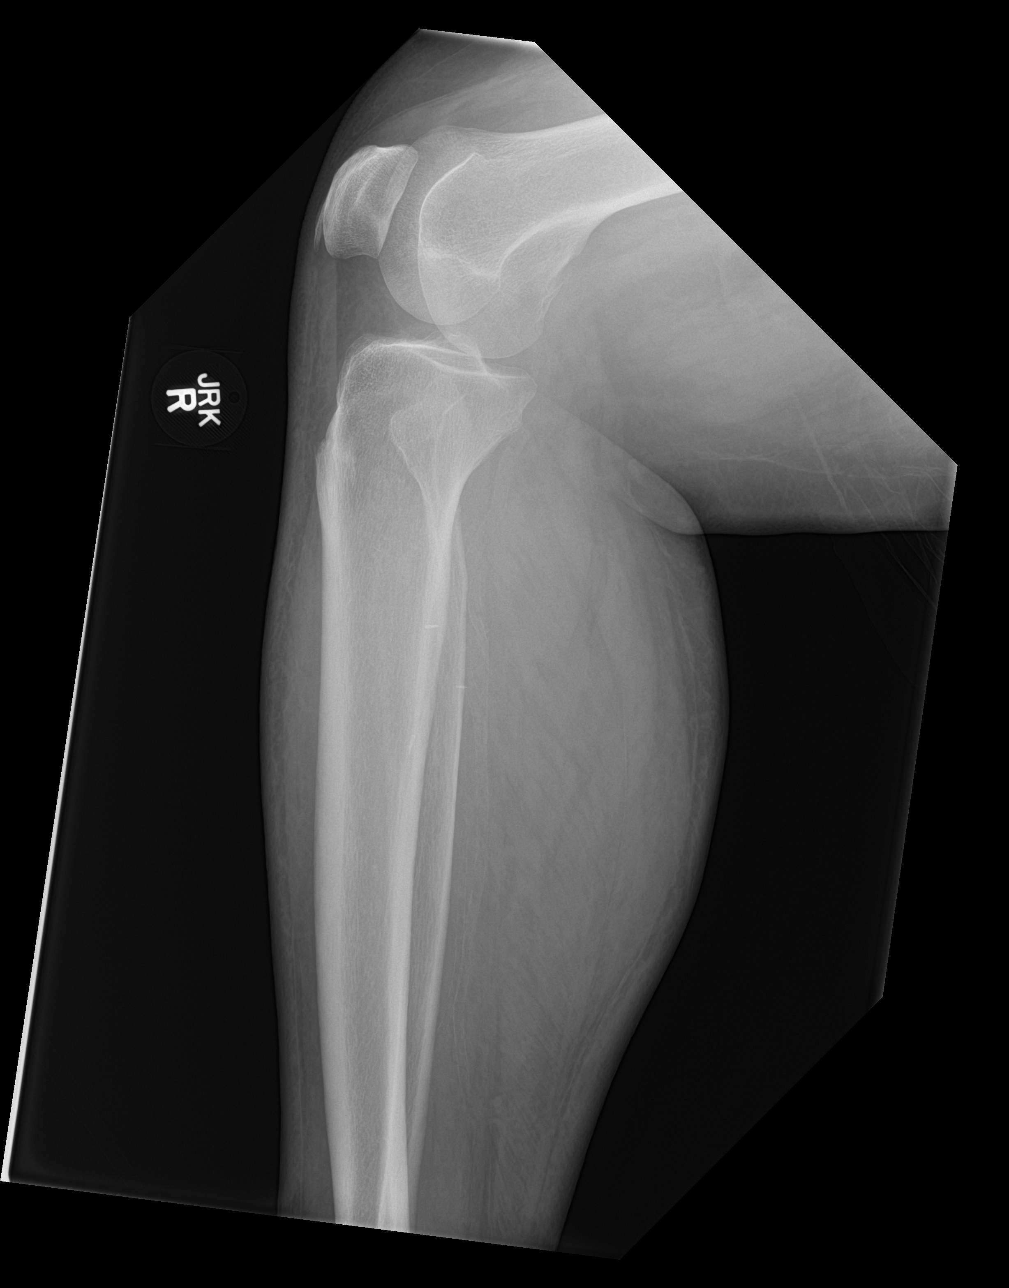

[4 of 4 positions shown; findings below may reference images not displayed]

FINDINGS: There is no evidence of fracture or dislocation. No osseous erosions
are seen. The tibia and fibula appear intact. The ankle mortise is
incompletely assessed, but appears grossly unremarkable. The knee
joint is unremarkable in appearance. No knee joint effusion is
identified. A plantar calcaneal spur is noted.

Mild vascular calcifications are seen. Diffuse soft tissue swelling
is noted about the lower leg and ankle.
IMPRESSION: 1. No evidence of fracture or dislocation. No osseous erosions seen.
2. Diffuse soft tissue swelling about the lower leg and ankle.
3. Mild vascular calcifications seen.

## 2015-11-20 MED ORDER — CLINDAMYCIN HCL 150 MG PO CAPS
300.0000 mg | ORAL_CAPSULE | Freq: Once | ORAL | Status: AC
  Administered 2015-11-20: 300 mg via ORAL
  Filled 2015-11-20: qty 2

## 2015-11-20 MED ORDER — CLINDAMYCIN HCL 300 MG PO CAPS: 300.0000 mg | ORAL_CAPSULE | Freq: Three times a day (TID) | ORAL | Status: DC

## 2015-11-20 NOTE — Discharge Instructions (Signed)
You have been seen today in the Emergency Department (ED) for cellulitis, a superficial skin infection. Please take your antibiotics as prescribed for their ENTIRE prescribed duration.    Please follow up with your doctor or in the ED in 24-48 hours for recheck of your infection if you are not improving.  Call your doctor sooner or return to the ED if you develop worsening signs of infection such as: increased redness, increased pain, pus, fever, or other symptoms that concern you.   Cellulitis Cellulitis is an infection of the skin and the tissue beneath it. The infected area is usually red and tender. Cellulitis occurs most often in the arms and lower legs.  CAUSES  Cellulitis is caused by bacteria that enter the skin through cracks or cuts in the skin. The most common types of bacteria that cause cellulitis are staphylococci and streptococci. SIGNS AND SYMPTOMS   Redness and warmth.  Swelling.  Tenderness or pain.  Fever. DIAGNOSIS  Your health care provider can usually determine what is wrong based on a physical exam. Blood tests may also be done. TREATMENT  Treatment usually involves taking an antibiotic medicine. HOME CARE INSTRUCTIONS   Take your antibiotic medicine as directed by your health care provider. Finish the antibiotic even if you start to feel better.  Keep the infected arm or leg elevated to reduce swelling.  Apply a warm cloth to the affected area up to 4 times per day to relieve pain.  Take medicines only as directed by your health care provider.  Keep all follow-up visits as directed by your health care provider. SEEK MEDICAL CARE IF:   You notice red streaks coming from the infected area.  Your red area gets larger or turns dark in color.  Your bone or joint underneath the infected area becomes painful after the skin has healed.  Your infection returns in the same area or another area.  You notice a swollen bump in the infected area.  You develop  new symptoms.  You have a fever. SEEK IMMEDIATE MEDICAL CARE IF:   You feel very sleepy.  You develop vomiting or diarrhea.  You have a general ill feeling (malaise) with muscle aches and pains.   This information is not intended to replace advice given to you by your health care provider. Make sure you discuss any questions you have with your health care provider.   Document Released: 05/03/2005 Document Revised: 04/14/2015 Document Reviewed: 10/09/2011 Elsevier Interactive Patient Education [employer].

## 2015-11-20 NOTE — ED Provider Notes (Signed)
[facility] Emergency Department Provider Note  ____________________________________________  Time seen: Approximately 8:07 PM  I have reviewed the triage vital signs and the nursing notes.   HISTORY  Chief Complaint Cellulitis    HPI Evan [name] is a 60 y.o. male the previous history of coronary disease, cellulitis, heart disease, as well as congestive heart failure.  Patient reports that he's had increasing redness and swelling in his right leg about the last 3 days. He has become painful to walk on, and he is concerned about infection. He reports that the area of his right leg is warm.  He also reports he's been on antibiotic to treat this recently, he thought symptoms were getting better but now she beginning worse again. He does not exactly when he started the antibiotic, but he reports that [facility] be able to tell us. He denies any numbness or tingling in the leg. No fevers or chills.  No abdominal pain nausea or vomiting.  Of note he is diabetic.   Past Medical History  Diagnosis Date  . CAD (coronary artery disease)   . Hypertension   . Hyperlipidemia   . Gastroesophageal reflux   . Obstructive sleep apnea   . History of pneumonia     Remote  . History of scarlet fever     Childhood  . Obesity   . Cellulitis   . Myocardial infarction ([place]) 2009  . COPD (chronic obstructive pulmonary disease) ([place])   . H/O: GI bleed   . Anxiety disorder   . Panic disorder   . Diabetes mellitus without complication ([place])   . CHF (congestive heart failure) ([place])   . Asthma     Patient Active Problem List   Diagnosis Date Noted  . Obstructive sleep apnea 04/13/2015  . Diabetes ([place]) 04/13/2015  . Tooth pain 04/02/2015  . Chest pain, central 04/01/2015  . Cellulitis of right leg 02/17/2015  . Chronic venous insufficiency 09/10/2014  . Chronic diastolic CHF (congestive heart failure) ([place]) 01/09/2014  . GERD  (gastroesophageal reflux disease) 09/12/2013  . Obesity, morbid (more than 100 lbs over ideal weight or BMI > 40) ([place]) 07/26/2011  . OLD MYOCARDIAL INFARCTION 03/02/2010  . HYPERLIPIDEMIA, MIXED 03/20/2009  . HYPERTENSION, BENIGN 03/20/2009  . CORONARY ATHEROSCLEROSIS, ARTERY BYPASS GRAFT 03/20/2009    Past Surgical History  Procedure Laterality Date  . Cardiac catheterization    . Coronary angioplasty with stent placement  2002  . Coronary angioplasty with stent placement  1999  . Cataract extraction Left   . Coronary artery bypass graft      x7    Current Outpatient Rx  Name  Route  Sig  Dispense  Refill  . aspirin 81 MG EC tablet   Oral   Take 81 mg by mouth 2 (two) times daily.          . clindamycin (CLEOCIN) 300 MG capsule   Oral   Take 1 capsule (300 mg total) by mouth 3 (three) times daily.   30 capsule   0   . esomeprazole (NEXIUM) 40 MG capsule   Oral   Take 40 mg by mouth daily.          . fluticasone (FLONASE) 50 MCG/ACT nasal spray   Each Nare   Place 2 sprays into both nostrils daily.          . furosemide (LASIX) 40 MG tablet      80 mg 2 (two)  times daily. Take 80 mg in the am with 40 mg in the pm.         . hydrOXYzine (ATARAX/VISTARIL) 25 MG tablet   Oral   Take 25 mg by mouth at bedtime.         . metFORMIN (GLUCOPHAGE) 500 MG tablet   Oral   Take 1 tablet (500 mg total) by mouth 2 (two) times daily with a meal. Patient taking differently: Take 1,000 mg by mouth at bedtime.    60 tablet   0   . metoprolol tartrate (LOPRESSOR) 25 MG tablet   Oral   Take 50 mg by mouth 2 (two) times daily.          Marland Kitchen oxyCODONE (OXY IR/ROXICODONE) 5 MG immediate release tablet   Oral   Take 1 tablet (5 mg total) by mouth every 6 (six) hours as needed for moderate pain or severe pain.   20 tablet   0   . potassium chloride (K-DUR) 10 MEQ tablet   Oral   Take 1 tablet (10 mEq total) by mouth daily.   30 tablet   6   . PROAIR HFA 108 (90  BASE) MCG/ACT inhaler   Inhalation   Inhale 1 puff into the lungs every 4 (four) hours as needed.          . rosuvastatin (CRESTOR) 20 MG tablet   Oral   Take 20 mg by mouth daily.           Allergies Penicillin g and Sulfa antibiotics  Family History  Problem Relation Age of Onset  . Coronary artery disease Other   . Heart attack Mother   . Hypertension Mother   . Hyperlipidemia Mother   . Heart attack Brother 23    MI    Social History Social History  Substance Use Topics  . Smoking status: Never Smoker   . Smokeless tobacco: Never Used  . Alcohol Use: No    Review of Systems Constitutional: No fever/chills Eyes: No visual changes. ENT: No sore throat. Cardiovascular: Denies chest pain. Respiratory: Denies shortness of breath. Gastrointestinal: No abdominal pain.  No nausea, no vomiting.  No diarrhea.  No constipation. Genitourinary: Negative for dysuria. Musculoskeletal: Negative for back pain. See history of present illness. Skin: Negative for rash. Neurological: Negative for headaches, focal weakness or numbness.  10-point ROS otherwise negative.  ____________________________________________   PHYSICAL EXAM:  VITAL SIGNS: ED Triage Vitals  Enc Vitals Group     BP 11/20/15 1513 121/81 mmHg     Pulse Rate 11/20/15 1513 82     Resp 11/20/15 1513 18     Temp 11/20/15 1513 98.3 F (36.8 C)     Temp Source 11/20/15 1513 Oral     SpO2 11/20/15 1513 95 %     Weight 11/20/15 1513 281 lb (127.461 kg)     Height 11/20/15 1513 5\' 5"  (1.651 m)     Head Cir --      Peak Flow --      Pain Score 11/20/15 1515 7     Pain Loc --      Pain Edu? --      Excl. in [place]? --    Constitutional: Alert and oriented. Well appearing and in no acute distress. Eyes: Conjunctivae are normal. PERRL. EOMI. Head: Atraumatic. Nose: No congestion/rhinnorhea. Mouth/Throat: Mucous membranes are moist.  Oropharynx non-erythematous. Neck: No stridor.   Cardiovascular: Normal  rate, regular rhythm. Grossly normal heart sounds.  Good  peripheral circulation. Respiratory: Normal respiratory effort.  No retractions. Lungs CTAB. Gastrointestinal: Soft and nontender. No distention.  Musculoskeletal:   Lower Extremities  No edema. Normal DP/PT pulses bilateral with good cap refill.  Normal neuro-motor function lower extremities bilateral.  RIGHT Right lower extremity demonstrates normal strength, good use of all muscles. No edema bruising or contusions of the right hip, right knee, right ankle. Full range of motion of the right lower extremity without pain except for some tenderness in his right calf and right lower leg leg with movement. No pain on axial loading. No evidence of trauma. Normal dorsalis pedis pulse. Normal capillary refill.  There is circumferential erythema, mild edema and swelling from the right calf. It is tender to touch. No crepitance. The patient has erythema that is circumferential with some small healing scratches about the lower portion of the leg.  LEFT Left lower extremity demonstrates normal strength, good use of all muscles. No edema bruising or contusions of the hip,  knee, ankle. Full range of motion of the left lower extremity without pain. No pain on axial loading. No evidence of trauma.   Neurologic:  Normal speech and language. No gross focal neurologic deficits are appreciated. No gait instability. Skin:  Skin is warm, dry and intact. No rash noted. Psychiatric: Mood and affect are normal. Speech and behavior are normal.  ____________________________________________   LABS (all labs ordered are listed, but only abnormal results are displayed)  Labs Reviewed  CBC WITH DIFFERENTIAL/PLATELET - Abnormal; Notable for the following:    WBC 13.1 (*)    RDW 16.4 (*)    Neutro Abs 8.8 (*)    Monocytes Absolute 1.2 (*)    All other components within normal limits  COMPREHENSIVE METABOLIC PANEL - Abnormal; Notable for the following:     Glucose, Bld 125 (*)    Total Protein 8.6 (*)    All other components within normal limits   ____________________________________________  EKG   ____________________________________________  RADIOLOGY  US Venous Img Lower Unilateral Right (Final result) Result time: 11/20/15 21:06:07   Final result by Rad Results In Interface (11/20/15 21:06:07)   Narrative:   CLINICAL DATA: Right lower extremity edema and redness. Evaluate for DVT.  EXAM: RIGHT LOWER EXTREMITY VENOUS DOPPLER ULTRASOUND  TECHNIQUE: Gray-scale sonography with graded compression, as well as color Doppler and duplex ultrasound were performed to evaluate the lower extremity deep venous systems from the level of the common femoral vein and including the common femoral, femoral, profunda femoral, popliteal and calf veins including the posterior tibial, peroneal and gastrocnemius veins when visible. The superficial great saphenous vein was also interrogated. Spectral Doppler was utilized to evaluate flow at rest and with distal augmentation maneuvers in the common femoral, femoral and popliteal veins.  COMPARISON: None.  FINDINGS: Contralateral Common Femoral Vein: Respiratory phasicity is normal and symmetric with the symptomatic side. No evidence of thrombus. Normal compressibility.  Common Femoral Vein: No evidence of thrombus. Normal compressibility, respiratory phasicity and response to augmentation.  Saphenofemoral Junction: No evidence of thrombus. Normal compressibility and flow on color Doppler imaging.  Profunda Femoral Vein: No evidence of thrombus. Normal compressibility and flow on color Doppler imaging.  Femoral Vein: No evidence of thrombus. Normal compressibility, respiratory phasicity and response to augmentation.  Popliteal Vein: No evidence of thrombus. Normal compressibility, respiratory phasicity and response to augmentation.  Calf Veins: Visualized calf veins are  unremarkable. The peroneal vein is not visualized.  Superficial Great Saphenous Vein: No evidence of thrombus. Normal  compressibility and flow on color Doppler imaging.  Venous Reflux: None.  Other Findings: None.  IMPRESSION: No evidence of deep venous thrombosis.   Electronically Signed By: Lovey Newcomer M.D. On: 11/20/2015 21:06          DG Tibia/Fibula Right (Final result) Result time: 11/20/15 19:39:15   Final result by Rad Results In Interface (11/20/15 19:39:15)   Narrative:   CLINICAL DATA: Acute onset of right lower extremity swelling and erythema. Right lower leg pain. Initial encounter.  EXAM: RIGHT TIBIA AND FIBULA - 2 VIEW  COMPARISON: Right tibia/fibula CT performed 04/29/2014  FINDINGS: There is no evidence of fracture or dislocation. No osseous erosions are seen. The tibia and fibula appear intact. The ankle mortise is incompletely assessed, but appears grossly unremarkable. The knee joint is unremarkable in appearance. No knee joint effusion is identified. A plantar calcaneal spur is noted.  Mild vascular calcifications are seen. Diffuse soft tissue swelling is noted about the lower leg and ankle.  IMPRESSION: 1. No evidence of fracture or dislocation. No osseous erosions seen. 2. Diffuse soft tissue swelling about the lower leg and ankle. 3. Mild vascular calcifications seen.   Electronically Signed By: Garald Balding M.D. On: 11/20/2015 19:39    ____________________________________________   PROCEDURES  Procedure(s) performed: None  Critical Care performed: No  ____________________________________________   INITIAL IMPRESSION / ASSESSMENT AND PLAN / ED COURSE  Pertinent labs & imaging results that were available during my care of the patient were reviewed by me and considered in my medical decision making (see chart for details).   evaluation of redness and tenderness over the right lower leg. Examination appears  consistent with cellulitis.  D/W Dr. Trixie Rude, on April 3rd, patient seen at [facility] and noted erythema of the RLE. He was prescribed a skin cream and compression socks. But not treated with antibiotics. He is allergic to penicillins and sulfa drugs.  The patient does appear to have cellulitis without evidence of complication, no evidence of sepsis, afebrile with good vascular neuro exam distal. We will treat him with clindamycin by mouth. Careful return precautions discussed. [facility] with [facility] and they will be able to reevaluate and see him on Monday. Patient agreeable to close follow-up instructions and follow-up with [facility] Monday. ____________________________________________   FINAL CLINICAL IMPRESSION(S) / ED DIAGNOSES  Final diagnoses:  Cellulitis of right lower extremity without foot      Delman Kitten, MD 11/20/15 2139

## 2015-11-20 NOTE — ED Notes (Signed)
R lower leg reddened x 3 days, states has history of cellulitis same leg

## 2015-11-20 NOTE — ED Notes (Signed)
RN spoke to pt's PCP at [facility] regarding pt's condition. Given update regarding d/c and prescription sent with pt. PCP verbalized understanding at this time and will arrange for med pick up. Pt made aware and verbalized understanding.

## 2015-11-20 NOTE — ED Notes (Signed)
Pt will be d/c once papers are placed.

## 2015-11-20 NOTE — ED Notes (Signed)
Pt at [place], will administer meds once returns.

## 2016-03-01 IMAGING — US US EXTREM LOW VENOUS*R*
1 series · 13 of 24 positions shown · non-contrast
Comparison: None.

CLINICAL DATA: Right lower extremity edema and redness. Evaluate
for DVT.



[Series 1: us extrem low venous*right* · 0.10mm/px · 13 of 34 slices shown]
[im 1/34]
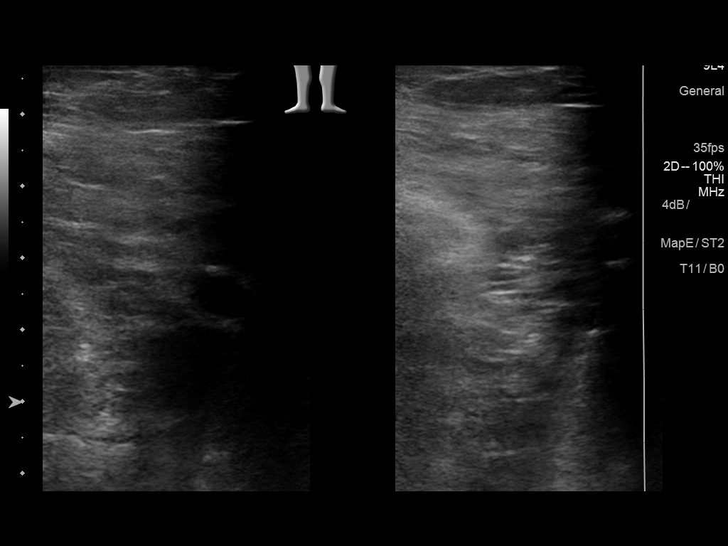
[im 3/34]
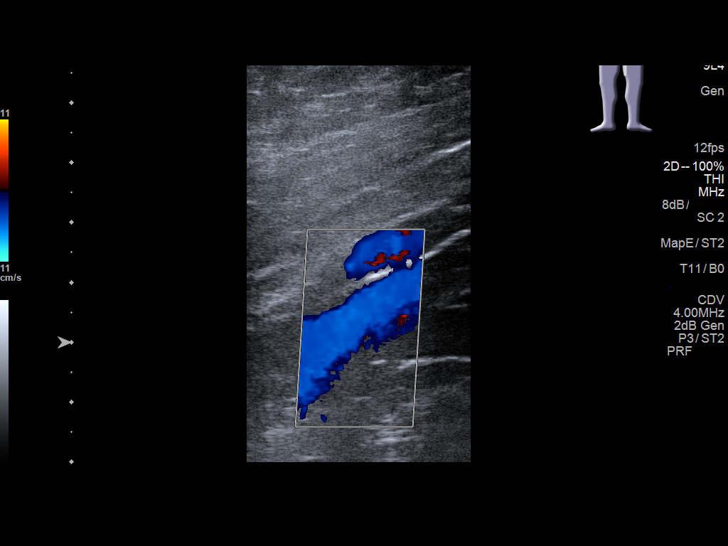
[im 6/34]
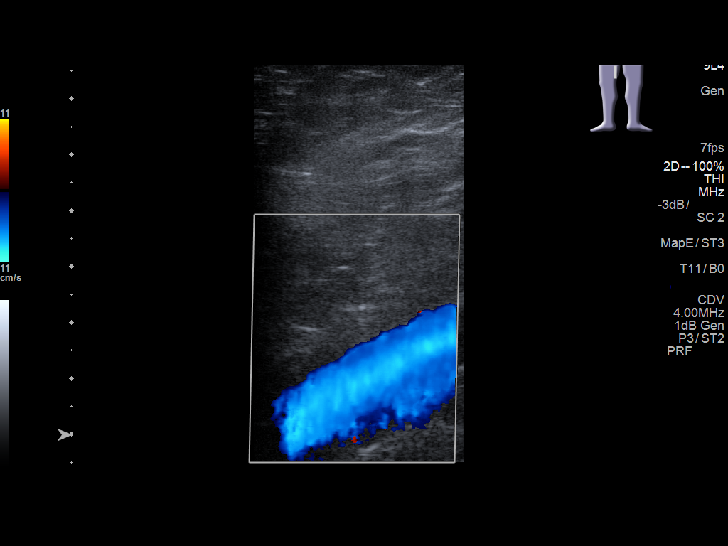
[im 9/34]
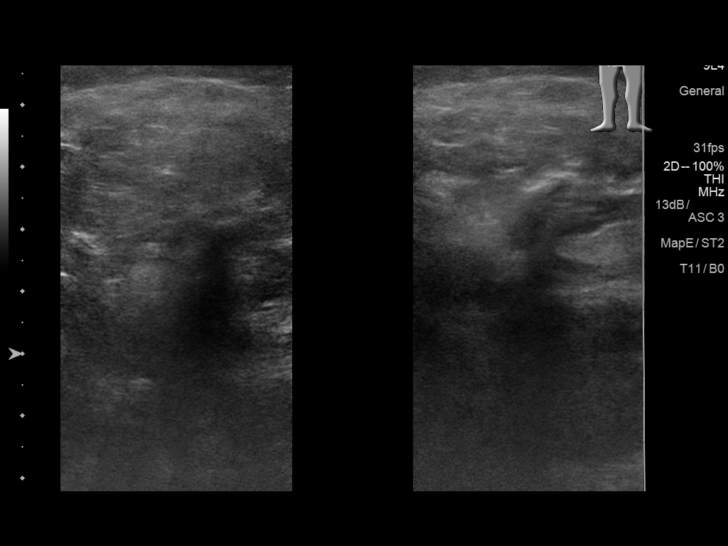
[im 12/34]
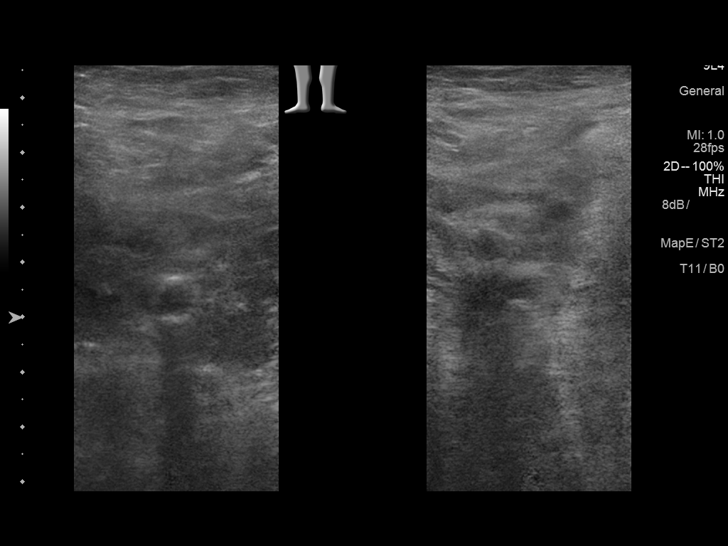
[im 15/34]
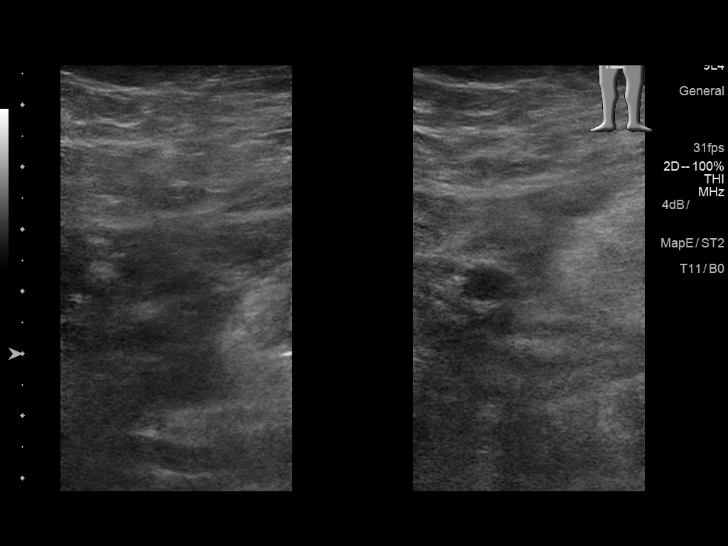
[im 18/34]
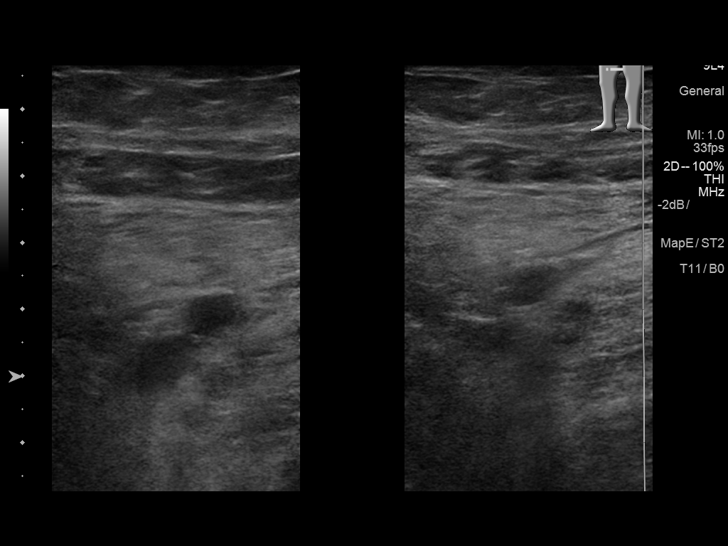
[im 19/34]
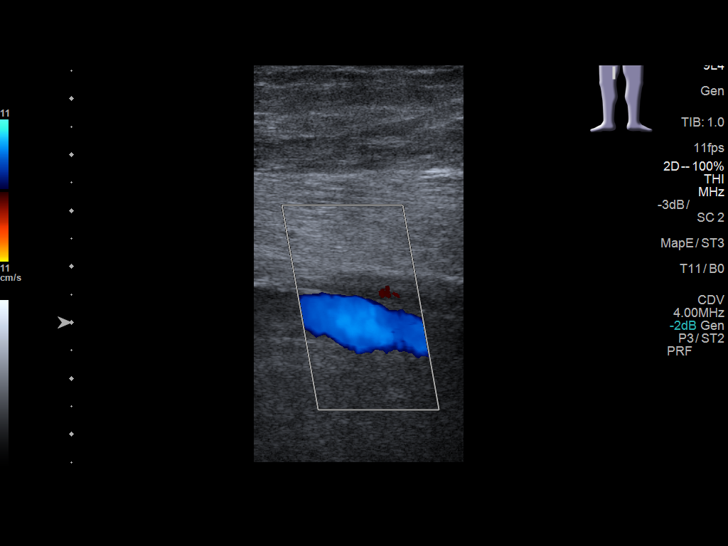
[im 22/34]
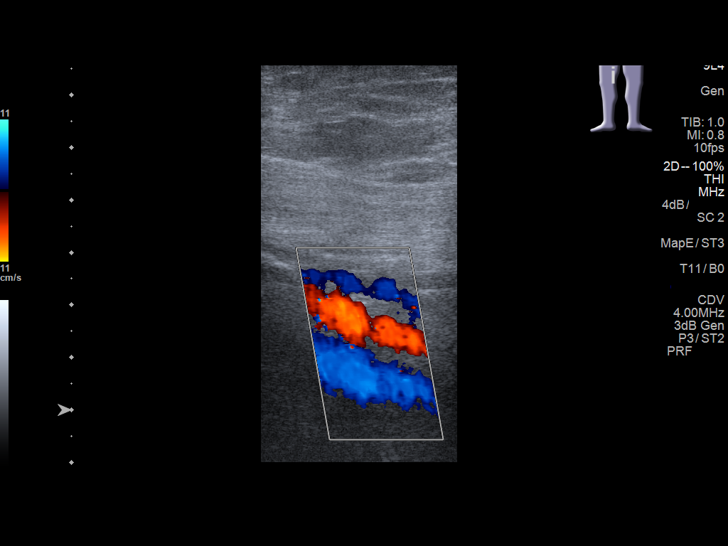
[im 25/34]
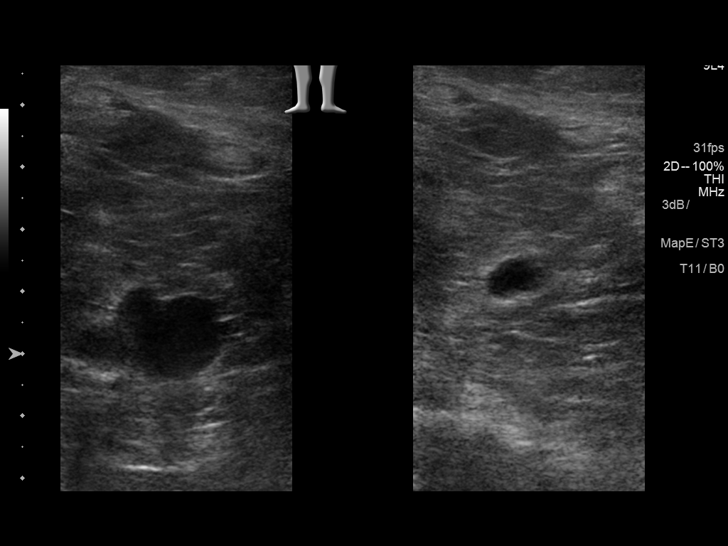
[im 28/34]
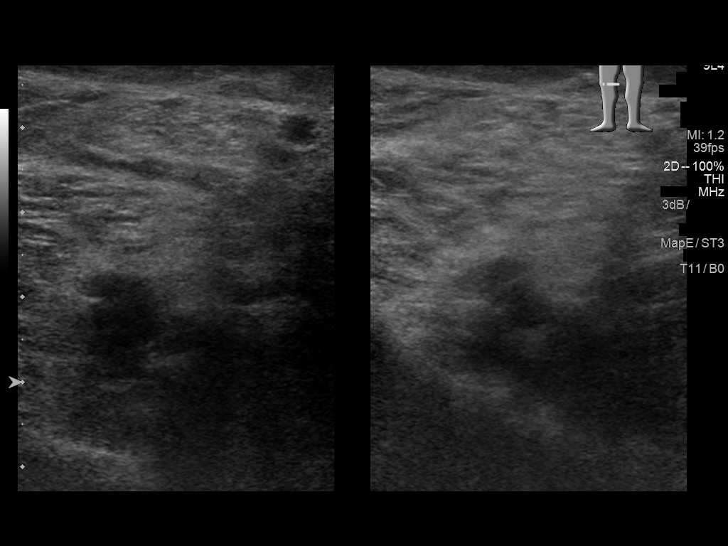
[im 31/34]
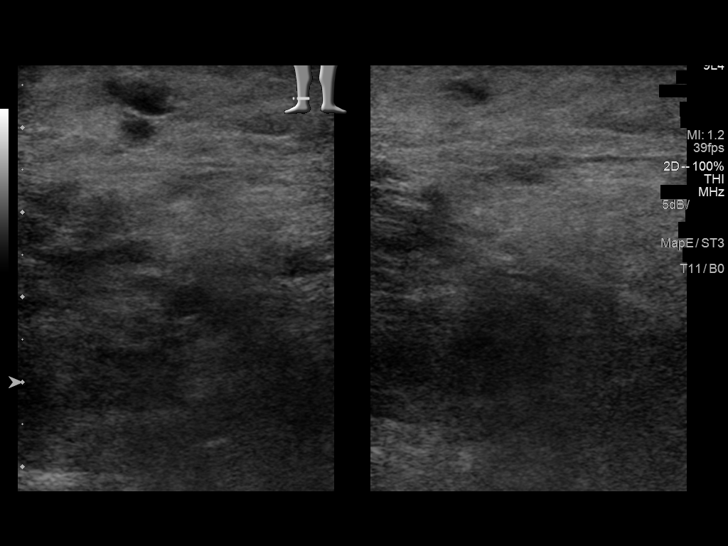
[im 34/34]
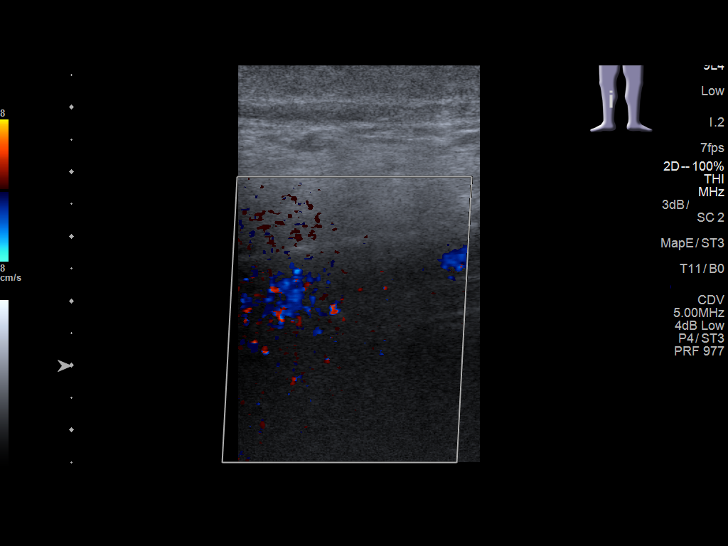

[13 of 24 positions shown; findings below may reference images not displayed]

FINDINGS: Contralateral Common Femoral Vein: Respiratory phasicity is normal
and symmetric with the symptomatic side. No evidence of thrombus.
Normal compressibility.

Common Femoral Vein: No evidence of thrombus. Normal
compressibility, respiratory phasicity and response to augmentation.

Saphenofemoral Junction: No evidence of thrombus. Normal
compressibility and flow on color Doppler imaging.

Profunda Femoral Vein: No evidence of thrombus. Normal
compressibility and flow on color Doppler imaging.

Femoral Vein: No evidence of thrombus. Normal compressibility,
respiratory phasicity and response to augmentation.

Popliteal Vein: No evidence of thrombus. Normal compressibility,
respiratory phasicity and response to augmentation.

Calf Veins: Visualized calf veins are unremarkable. The peroneal
vein is not visualized.

Superficial Great Saphenous Vein: No evidence of thrombus. Normal
compressibility and flow on color Doppler imaging.

Venous Reflux:  None.

Other Findings:  None.
IMPRESSION: No evidence of deep venous thrombosis.

## 2016-06-12 ENCOUNTER — Other Ambulatory Visit: Payer: Self-pay | Admitting: Gastroenterology

## 2016-06-12 DIAGNOSIS — R1310 Dysphagia, unspecified: Secondary | ICD-10-CM

## 2016-06-21 ENCOUNTER — Ambulatory Visit
Admission: RE | Admit: 2016-06-21 | Discharge: 2016-06-21 | Disposition: A | Discharge status: Home / Self Care | Payer: Medicare (Managed Care) | Source: Ambulatory Visit | Attending: Gastroenterology | Admitting: Gastroenterology

## 2016-06-21 DIAGNOSIS — K228 Other specified diseases of esophagus: Secondary | ICD-10-CM | POA: Diagnosis not present

## 2016-06-21 DIAGNOSIS — K449 Diaphragmatic hernia without obstruction or gangrene: Secondary | ICD-10-CM | POA: Diagnosis not present

## 2016-06-21 DIAGNOSIS — R1310 Dysphagia, unspecified: Secondary | ICD-10-CM | POA: Diagnosis present

## 2016-06-21 DIAGNOSIS — K219 Gastro-esophageal reflux disease without esophagitis: Secondary | ICD-10-CM | POA: Diagnosis present

## 2016-06-21 IMAGING — RF DG ESOPHAGUS
2 series · 11 of 11 positions shown · non-contrast
Comparison: CT scan of the chest [DATE]

CLINICAL DATA: Productive cough with gagging on mucus. Occasional
episodes of food becoming stuck in the esophagus. History of
gastroesophageal reflux, previous CABG.

EXAM:
ESOPHOGRAM / BARIUM SWALLOW / BARIUM TABLET STUDY
TECHNIQUE: Combined double contrast and single contrast examination performed
using effervescent crystals, thick barium liquid, and thin barium
liquid. The patient was observed with fluoroscopy swallowing a 13 mm
barium sulphate tablet.
FLUOROSCOPY TIME:  Fluoroscopy Time:  0 minutes, 54 seconds
Radiation Exposure Index (if provided by the fluoroscopic device):
1897.06 micro Gy per meter squared
Number of Acquired Spot Images: 7+ 1 video loop

[Series 1: fluoro_barium 2fps_bw · 0.17mm/px · 7 of 7 slices shown (1 of 2)]
[im 1/7]
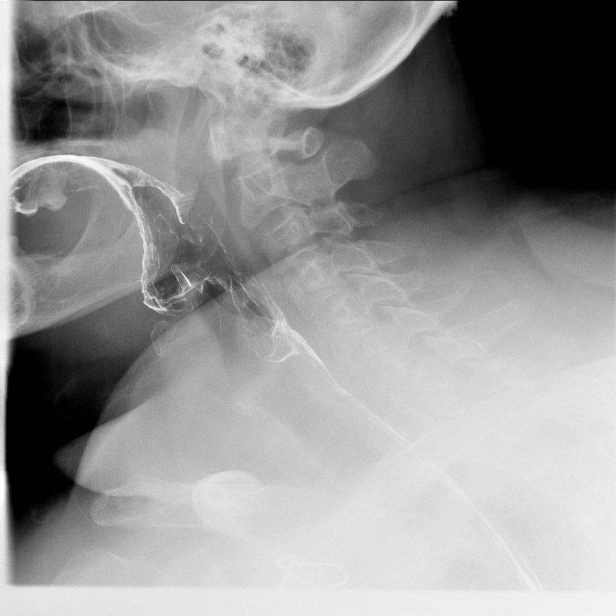
[im 2/7]
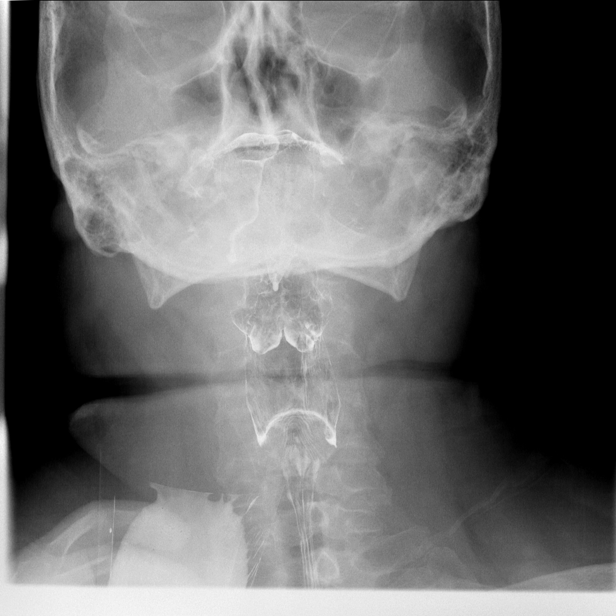
[im 3/7]
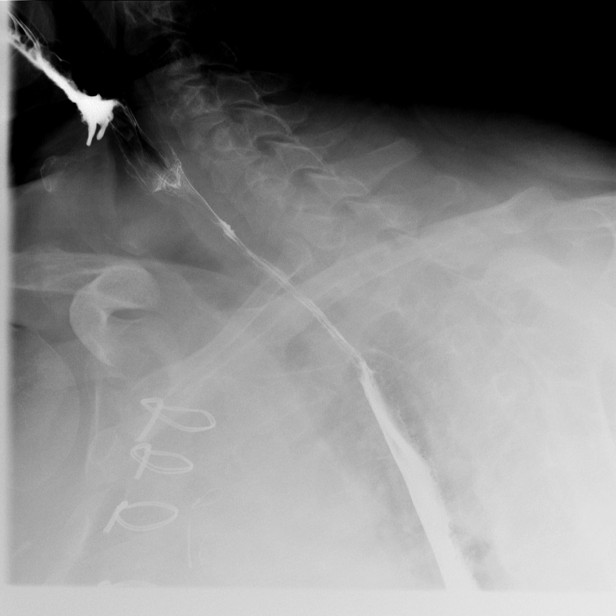
[im 4/7]
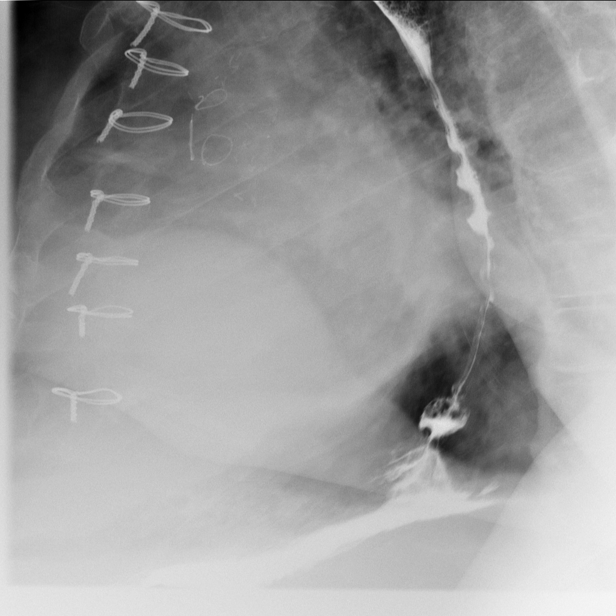
[im 5/7]
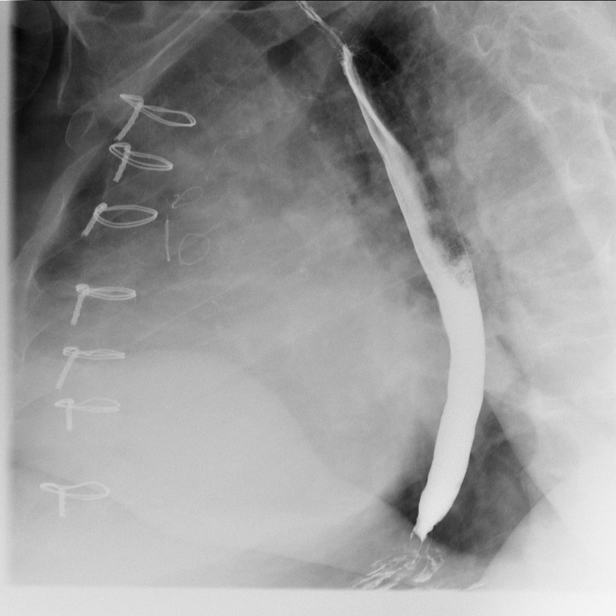
[im 6/7]
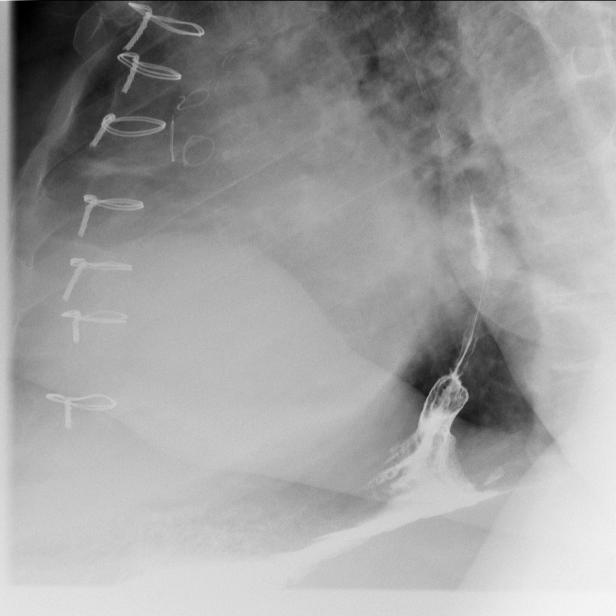
[im 7/7]
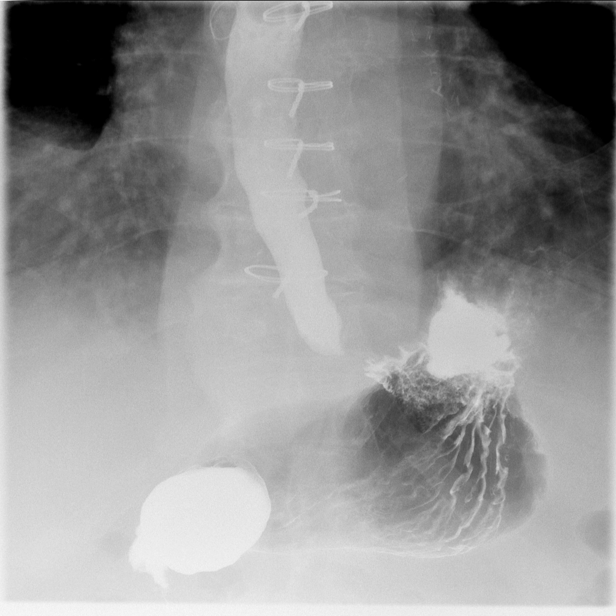

[Series 8: fluoro_barium 2fps_bw · 0.18mm/px · 4 of 19 frames shown (2 of 2)]
[frame 3/19]
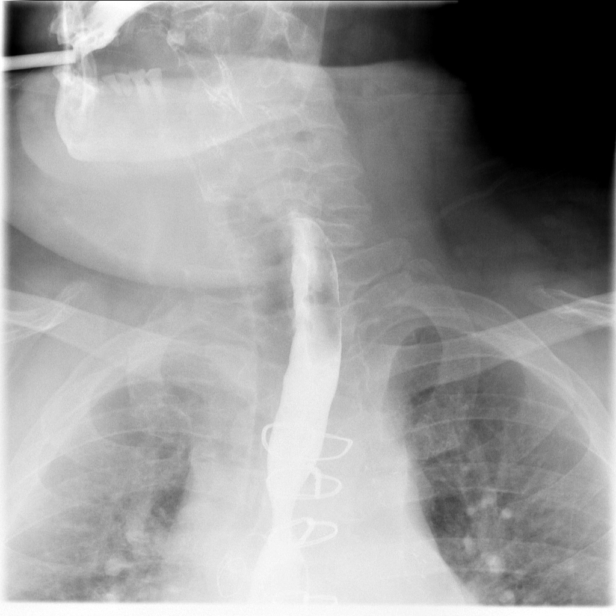
[frame 8/19]
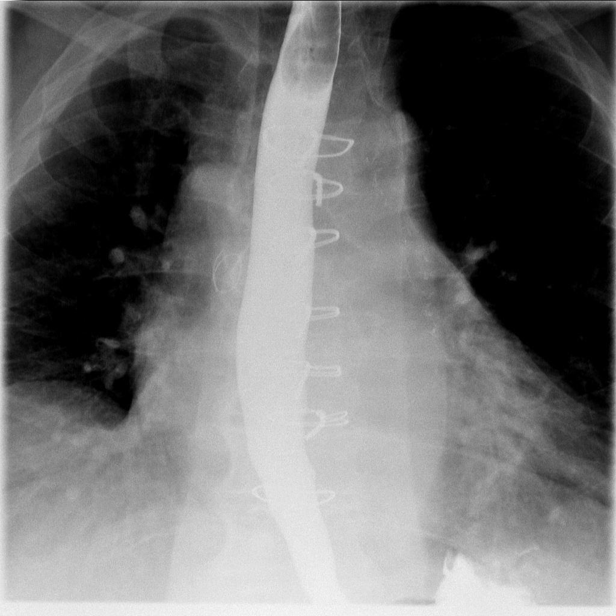
[frame 10/19]
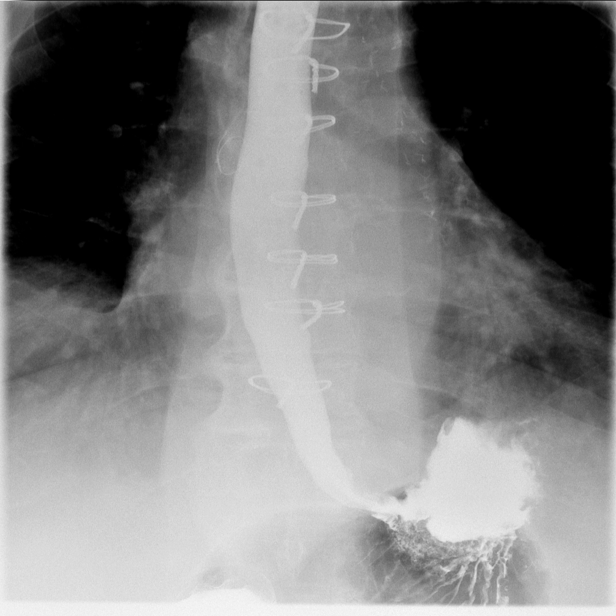
[frame 17/19]
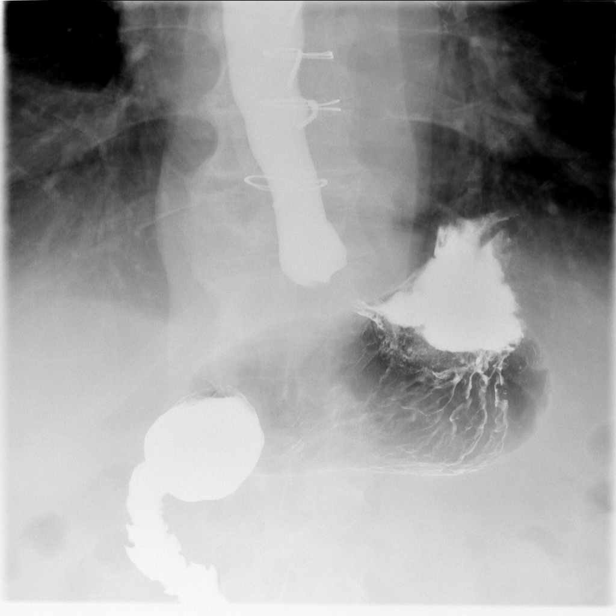

[11 of 11 positions shown; findings below may reference images not displayed]

FINDINGS: The patient ingested thick and thin barium and gas forming crystals
without difficulty. The cervical esophagus distended well. There was
no laryngeal penetration of the barium. The thoracic esophagus
distended well. Prominent tertiary contractions were observed
diffusely, intermittently. There is a small reducible hiatal hernia.
There is no fixed stricture nor evidence of esophagitis. The barium
tablet passed promptly from the mouth to the stomach.
IMPRESSION: Mild changes of presbyesophagus. No evidence of stricture nor
esophagitis. Small reducible hiatal hernia without reflux.

No laryngeal penetration of the barium.

## 2016-09-18 ENCOUNTER — Encounter: Payer: Self-pay | Admitting: *Deleted

## 2016-09-19 ENCOUNTER — Ambulatory Visit
Admission: RE | Admit: 2016-09-19 | Discharge: 2016-09-19 | Disposition: A | Discharge status: Home / Self Care | Payer: Medicare (Managed Care) | Source: Ambulatory Visit | Attending: Gastroenterology | Admitting: Gastroenterology

## 2016-09-19 ENCOUNTER — Ambulatory Visit: Payer: Medicare (Managed Care) | Admitting: Anesthesiology

## 2016-09-19 ENCOUNTER — Encounter: Payer: Self-pay | Admitting: *Deleted

## 2016-09-19 ENCOUNTER — Encounter
Admission: RE | Disposition: A | Discharge status: Home / Self Care | Payer: Self-pay | Source: Ambulatory Visit | Attending: Gastroenterology

## 2016-09-19 DIAGNOSIS — I509 Heart failure, unspecified: Secondary | ICD-10-CM | POA: Insufficient documentation

## 2016-09-19 DIAGNOSIS — J449 Chronic obstructive pulmonary disease, unspecified: Secondary | ICD-10-CM | POA: Insufficient documentation

## 2016-09-19 DIAGNOSIS — Z7951 Long term (current) use of inhaled steroids: Secondary | ICD-10-CM | POA: Diagnosis not present

## 2016-09-19 DIAGNOSIS — K3189 Other diseases of stomach and duodenum: Secondary | ICD-10-CM | POA: Insufficient documentation

## 2016-09-19 DIAGNOSIS — E785 Hyperlipidemia, unspecified: Secondary | ICD-10-CM | POA: Insufficient documentation

## 2016-09-19 DIAGNOSIS — Z7984 Long term (current) use of oral hypoglycemic drugs: Secondary | ICD-10-CM | POA: Diagnosis not present

## 2016-09-19 DIAGNOSIS — E1151 Type 2 diabetes mellitus with diabetic peripheral angiopathy without gangrene: Secondary | ICD-10-CM | POA: Insufficient documentation

## 2016-09-19 DIAGNOSIS — I110 Hypertensive heart disease with heart failure: Secondary | ICD-10-CM | POA: Insufficient documentation

## 2016-09-19 DIAGNOSIS — K224 Dyskinesia of esophagus: Secondary | ICD-10-CM | POA: Diagnosis not present

## 2016-09-19 DIAGNOSIS — I2510 Atherosclerotic heart disease of native coronary artery without angina pectoris: Secondary | ICD-10-CM | POA: Diagnosis not present

## 2016-09-19 DIAGNOSIS — I252 Old myocardial infarction: Secondary | ICD-10-CM | POA: Insufficient documentation

## 2016-09-19 DIAGNOSIS — Z79899 Other long term (current) drug therapy: Secondary | ICD-10-CM | POA: Diagnosis not present

## 2016-09-19 DIAGNOSIS — F419 Anxiety disorder, unspecified: Secondary | ICD-10-CM | POA: Insufficient documentation

## 2016-09-19 DIAGNOSIS — G4733 Obstructive sleep apnea (adult) (pediatric): Secondary | ICD-10-CM | POA: Diagnosis not present

## 2016-09-19 DIAGNOSIS — R1310 Dysphagia, unspecified: Secondary | ICD-10-CM | POA: Insufficient documentation

## 2016-09-19 DIAGNOSIS — K219 Gastro-esophageal reflux disease without esophagitis: Secondary | ICD-10-CM | POA: Diagnosis not present

## 2016-09-19 DIAGNOSIS — Z7982 Long term (current) use of aspirin: Secondary | ICD-10-CM | POA: Insufficient documentation

## 2016-09-19 DIAGNOSIS — K2950 Unspecified chronic gastritis without bleeding: Secondary | ICD-10-CM | POA: Insufficient documentation

## 2016-09-19 HISTORY — PX: ESOPHAGOGASTRODUODENOSCOPY: SHX5428

## 2016-09-19 HISTORY — DX: Venous insufficiency (chronic) (peripheral): I87.2

## 2016-09-19 LAB — GLUCOSE, CAPILLARY: Glucose-Capillary: 132 mg/dL — ABNORMAL HIGH (ref 65–99)

## 2016-09-19 SURGERY — EGD (ESOPHAGOGASTRODUODENOSCOPY)
Anesthesia: General

## 2016-09-19 MED ORDER — PROPOFOL 10 MG/ML IV BOLUS
INTRAVENOUS | Status: AC
  Filled 2016-09-19: qty 60

## 2016-09-19 MED ORDER — PROPOFOL 500 MG/50ML IV EMUL
INTRAVENOUS | Status: DC | PRN
  Administered 2016-09-19: 160 ug/kg/min via INTRAVENOUS

## 2016-09-19 MED ORDER — LIDOCAINE 2% (20 MG/ML) 5 ML SYRINGE
INTRAMUSCULAR | Status: DC | PRN
  Administered 2016-09-19: 40 mg via INTRAVENOUS

## 2016-09-19 MED ORDER — FENTANYL CITRATE (PF) 100 MCG/2ML IJ SOLN
INTRAMUSCULAR | Status: AC
  Filled 2016-09-19: qty 2

## 2016-09-19 MED ORDER — FENTANYL CITRATE (PF) 100 MCG/2ML IJ SOLN
INTRAMUSCULAR | Status: DC | PRN
  Administered 2016-09-19 (×2): 50 ug via INTRAVENOUS

## 2016-09-19 MED ORDER — SODIUM CHLORIDE 0.9 % IV SOLN
INTRAVENOUS | Status: DC
  Administered 2016-09-19 (×2): via INTRAVENOUS

## 2016-09-19 MED ORDER — PROPOFOL 10 MG/ML IV BOLUS
INTRAVENOUS | Status: AC
  Filled 2016-09-19: qty 20

## 2016-09-19 MED ORDER — PHENYLEPHRINE HCL 10 MG/ML IJ SOLN
INTRAMUSCULAR | Status: DC | PRN
  Administered 2016-09-19: 100 ug via INTRAVENOUS

## 2016-09-19 MED ORDER — MIDAZOLAM HCL 2 MG/2ML IJ SOLN
INTRAMUSCULAR | Status: AC
  Filled 2016-09-19: qty 2

## 2016-09-19 MED ORDER — SODIUM CHLORIDE 0.9 % IV SOLN: INTRAVENOUS | Status: DC

## 2016-09-19 MED ORDER — PROPOFOL 10 MG/ML IV BOLUS
INTRAVENOUS | Status: DC | PRN
  Administered 2016-09-19: 100 mg via INTRAVENOUS

## 2016-09-19 MED ORDER — MIDAZOLAM HCL 5 MG/5ML IJ SOLN
INTRAMUSCULAR | Status: DC | PRN
  Administered 2016-09-19 (×2): 1 mg via INTRAVENOUS

## 2016-09-19 NOTE — Anesthesia Post-op Follow-up Note (Cosign Needed)
Anesthesia QCDR form completed.        

## 2016-09-19 NOTE — Op Note (Signed)
[facility] Gastroenterology Patient Name: Evan [name] Procedure Date: 09/19/2016 11:10 AM MRN: [id number] Account #: [id number] Date of Birth: 1956/07/26 Admit Type: Outpatient Age: 61 Room: [facility] ENDO ROOM 3 Gender: Male Note Status: Finalized Procedure:            Upper GI endoscopy Indications:          Dysphagia, Gastro-esophageal reflux disease Providers:            Lollie Sails, MD Referring MD:         No Local Md, MD (Referring MD) Medicines:            Monitored Anesthesia Care Complications:        No immediate complications. Procedure:            Pre-Anesthesia Assessment:                       - ASA Grade Assessment: III - A patient with severe                        systemic disease.                       After obtaining informed consent, the endoscope was                        passed under direct vision. Throughout the procedure,                        the patient's blood pressure, pulse, and oxygen                        saturations were monitored continuously. The Endoscope                        was introduced through the mouth, and advanced to the                        third part of duodenum. The upper GI endoscopy was                        accomplished without difficulty. The patient tolerated                        the procedure well. Findings:      Abnormal motility was noted in the middle third of the esophagus and in       the lower third of the esophagus. The cricopharyngeus was normal. There       is spasticity of the esophageal body. The distal esophagus/lower       esophageal sphincter is spastic, but gives up passage to the endoscope.       Tertiary peristaltic waves are noted.      Multiple dispersed, small non-bleeding erosions were found in the       gastric antrum. There were no stigmata of recent bleeding. Biopsies were       taken with a cold forceps for histology. Biopsies were taken with a cold       forceps for  Helicobacter pylori testing.      The cardia and gastric fundus were normal on retroflexion.      The examined duodenum was normal.  No endoscopic abnormality was evident in the esophagus to explain the       patient's complaint of dysphagia, exception of presbyesophagus. It was       decided, however, to proceed with dilation in the distal esophagus and       at the gastroesophageal junction. A TTS dilator was passed through the       scope. Dilation with a 12-13.5-15 mm balloon dilator was performed to 15       mm. Impression:           - Abnormal esophageal motility, consistent with                        presbyesophagus.                       - Non-bleeding erosive gastropathy. Biopsied.                       - Normal examined duodenum.                       - No endoscopic esophageal abnormality to explain                        patient's dysphagia. Esophagus dilated. Dilated. Recommendation:       - Discharge patient to home.                       - Use Protonix (pantoprazole) 40 mg PO BID.                       - Return to GI clinic in 4 weeks. Procedure Code(s):    --- Professional ---                       [phone], Esophagogastroduodenoscopy, flexible, transoral;                        with transendoscopic balloon dilation of esophagus                        (less than 30 mm diameter)                       43239, Esophagogastroduodenoscopy, flexible, transoral;                        with biopsy, single or multiple Diagnosis Code(s):    --- Professional ---                       K22.4, Dyskinesia of esophagus                       K31.89, Other diseases of stomach and duodenum                       R13.10, Dysphagia, unspecified                       K21.9, Gastro-esophageal reflux disease without                        esophagitis CPT copyright 2016 American Medical Association. All rights  reserved. The codes documented in this report are preliminary and upon coder review  may  be revised to meet current compliance requirements. Lollie Sails, MD 09/19/2016 12:02:58 PM This report has been signed electronically. Number of Addenda: 0 Note Initiated On: 09/19/2016 11:10 AM      [facility]

## 2016-09-19 NOTE — Transfer of Care (Signed)
Immediate Anesthesia Transfer of Care Note  Patient: Evan [name]  Procedure(s) Performed: Procedure(s): ESOPHAGOGASTRODUODENOSCOPY (EGD) (N/A)  Patient Location: PACU and Endoscopy Unit  Anesthesia Type:General  Level of Consciousness: sedated  Airway & Oxygen Therapy: Patient Spontanous Breathing and Patient connected to nasal cannula oxygen  Post-op Assessment: Report given to RN and Post -op Vital signs reviewed and stable  Post vital signs: Reviewed and stable  Last Vitals:  Vitals:   09/19/16 0958  BP: (!) 144/75  Pulse: 67  Resp: 18  Temp: 37.1 C    Last Pain:  Vitals:   09/19/16 0958  TempSrc: Tympanic         Complications: No apparent anesthesia complications

## 2016-09-19 NOTE — Anesthesia Procedure Notes (Signed)
Anesthesia Procedure Note 20 Ga. IV started right hand

## 2016-09-19 NOTE — Anesthesia Preprocedure Evaluation (Addendum)
Anesthesia Evaluation  Patient identified by MRN, date of birth, ID band Patient awake    Reviewed: Allergy & Precautions, NPO status , Patient's Chart, lab work & pertinent test results, reviewed documented beta blocker date and time   Airway Mallampati: III  TM Distance: <3 FB     Dental  (+) Chipped   Pulmonary asthma , sleep apnea , COPD,  COPD inhaler,    Pulmonary exam normal        Cardiovascular hypertension, Pt. on medications and Pt. on home beta blockers + CAD, + Past MI, + Peripheral Vascular Disease and +CHF  Normal cardiovascular exam     Neuro/Psych PSYCHIATRIC DISORDERS Anxiety    GI/Hepatic Neg liver ROS, GERD  Medicated and Controlled,  Endo/Other  diabetes, Well Controlled, Type 2, Oral Hypoglycemic Agents  Renal/GU negative Renal ROS  negative genitourinary   Musculoskeletal negative musculoskeletal ROS (+)   Abdominal (+) + obese,   Peds negative pediatric ROS (+)  Hematology negative hematology ROS (+)   Anesthesia Other Findings   Reproductive/Obstetrics                             Anesthesia Physical Anesthesia Plan  ASA: III  Anesthesia Plan: General   Post-op Pain Management:    Induction: Intravenous  Airway Management Planned: Nasal Cannula  Additional Equipment:   Intra-op Plan:   Post-operative Plan:   Informed Consent: I have reviewed the patients History and Physical, chart, labs and discussed the procedure including the risks, benefits and alternatives for the proposed anesthesia with the patient or authorized representative who has indicated his/her understanding and acceptance.   Dental advisory given  Plan Discussed with: CRNA and Surgeon  Anesthesia Plan Comments:         Anesthesia Quick Evaluation

## 2016-09-19 NOTE — H&P (Signed)
Outpatient short stay form Pre-procedure 09/19/2016 11:19 AM Lollie Sails MD  Primary Physician: Abelardo Diesel NP  Reason for visit:  EGD  History of present illness:  Patient is a 61 year old male presenting today as above. He has a history of gas after reflux which he states bothersome quite frequently. He does take proton pump inhibitor. Had a barium swallow done on 06/21/2016 this showing some changes of presbyesophagus no evidence of stricture or esophagitis as well as a small reducible hiatal hernia. He describes the reflux as being better rather than burning. He was shown to have prominent tertiary contractions. He does not regurgitate foods. He has never had an EGD before. He does take 81 mg aspirin that he has held. He takes no other blood thinning agents or aspirin products with the occasional exception of a Excedrin this he has not taken for several days. He does have a history of coronary artery disease with coronary angioplasty.    Current Facility-Administered Medications:  .  0.9 %  sodium chloride infusion, , Intravenous, Continuous, Lollie Sails, MD, Last Rate: 20 mL/hr at 09/19/16 1018 .  0.9 %  sodium chloride infusion, , Intravenous, Continuous, Lollie Sails, MD  Facility-Administered Medications Ordered in Other Encounters:  .  fentaNYL (SUBLIMAZE) injection, , Intravenous, Anesthesia Intra-op, Courtney Paris, CRNA, 50 mcg at 09/19/16 1112 .  midazolam (VERSED) 5 MG/5ML injection, , Intravenous, Anesthesia Intra-op, Courtney Paris, CRNA, 1 mg at 09/19/16 1112  Prescriptions Prior to Admission  Medication Sig Dispense Refill Last Dose  . aspirin 81 MG EC tablet Take 81 mg by mouth 2 (two) times daily.    09/18/2016 at Unknown time  . budesonide-formoterol (SYMBICORT) 80-4.5 MCG/ACT inhaler Inhale 2 puffs into the lungs 2 (two) times daily.     . calcium carbonate (OSCAL) 1500 (600 Ca) MG TABS tablet Take 1,500 mg by mouth 2 (two) times daily with a meal.     .  DULoxetine (CYMBALTA) 30 MG capsule Take 30 mg by mouth daily.     Marland Kitchen ibuprofen (ADVIL,MOTRIN) 600 MG tablet Take 600 mg by mouth every 6 (six) hours as needed.     Marland Kitchen lisinopril (PRINIVIL,ZESTRIL) 5 MG tablet Take 5 mg by mouth daily.     Marland Kitchen loratadine (CLARITIN) 10 MG tablet Take 10 mg by mouth daily.     . pantoprazole (PROTONIX) 40 MG tablet Take 40 mg by mouth daily.     . clindamycin (CLEOCIN) 300 MG capsule Take 1 capsule (300 mg total) by mouth 3 (three) times daily. 30 capsule 0   . esomeprazole (NEXIUM) 40 MG capsule Take 40 mg by mouth daily.    Taking  . fluticasone (FLONASE) 50 MCG/ACT nasal spray Place 2 sprays into both nostrils daily.    Taking  . furosemide (LASIX) 40 MG tablet 80 mg 2 (two) times daily. Take 80 mg in the am with 40 mg in the pm.   Taking  . hydrOXYzine (ATARAX/VISTARIL) 25 MG tablet Take 25 mg by mouth at bedtime.   Taking  . metFORMIN (GLUCOPHAGE) 500 MG tablet Take 1 tablet (500 mg total) by mouth 2 (two) times daily with a meal. (Patient taking differently: Take 1,000 mg by mouth at bedtime. ) 60 tablet 0 Taking  . metoprolol tartrate (LOPRESSOR) 25 MG tablet Take 50 mg by mouth 2 (two) times daily.    Taking  . oxyCODONE (OXY IR/ROXICODONE) 5 MG immediate release tablet Take 1 tablet (5 mg total) by mouth every 6 (  six) hours as needed for moderate pain or severe pain. 20 tablet 0 Taking  . potassium chloride (K-DUR) 10 MEQ tablet Take 1 tablet (10 mEq total) by mouth daily. 30 tablet 6 Taking  . PROAIR HFA 108 (90 BASE) MCG/ACT inhaler Inhale 1 puff into the lungs every 4 (four) hours as needed.    Taking  . rosuvastatin (CRESTOR) 20 MG tablet Take 20 mg by mouth daily.   Taking     Allergies  Allergen Reactions  . Penicillin G Hives  . Sulfa Antibiotics Hives  . Zoloft [Sertraline Hcl] Other (See Comments)     Past Medical History:  Diagnosis Date  . Anxiety disorder   . Asthma   . CAD (coronary artery disease)   . Cellulitis   . CHF (congestive  heart failure) ([place])    NYHA CLASS III,CHRONIC,DIASTOLIC  . COPD (chronic obstructive pulmonary disease) ([place])   . Diabetes mellitus without complication ([place])   . Gastroesophageal reflux   . H/O: GI bleed   . History of pneumonia    Remote  . History of scarlet fever    Childhood  . Hyperlipidemia   . Hypertension   . Myocardial infarction 2009  . Obesity   . Obstructive sleep apnea   . Panic disorder   . Peripheral venous insufficiency     Review of systems:      Physical Exam    Heart and lungs: Regular rate and rhythm without rub or gallop lungs are bilaterally clear    HEENT: Normocephalic atraumatic eyes are anicteric    Other:     Pertinant exam for procedure: Soft nontender nondistended bowel sounds positive normoactive    Planned proceedures: EGD and indicated procedures. I have discussed the risks benefits and complications of procedures to include not limited to bleeding, infection, perforation and the risk of sedation and the patient wishes to proceed.    Lollie Sails, MD Gastroenterology 09/19/2016  11:19 AM

## 2016-09-20 ENCOUNTER — Encounter: Payer: Self-pay | Admitting: Gastroenterology

## 2016-09-20 LAB — SURGICAL PATHOLOGY

## 2016-09-21 NOTE — Anesthesia Postprocedure Evaluation (Signed)
Anesthesia Post Note  Patient: Evan [name]  Procedure(s) Performed: Procedure(s) (LRB): ESOPHAGOGASTRODUODENOSCOPY (EGD) (N/A)  Patient location during evaluation: PACU Anesthesia Type: General Level of consciousness: awake and alert Pain management: pain level controlled Vital Signs Assessment: post-procedure vital signs reviewed and stable Respiratory status: spontaneous breathing, nonlabored ventilation, respiratory function stable and patient connected to nasal cannula oxygen Cardiovascular status: blood pressure returned to baseline and stable Postop Assessment: no signs of nausea or vomiting Anesthetic complications: no     Last Vitals:  Vitals:   09/19/16 1224 09/19/16 1234  BP: 114/80 131/71  Pulse: 71 72  Resp: 15 19  Temp:      Last Pain:  Vitals:   09/19/16 1204  TempSrc: Tympanic                 Molli Barrows

## 2016-09-28 ENCOUNTER — Encounter: Payer: Self-pay | Admitting: Gastroenterology

## 2017-04-26 ENCOUNTER — Encounter: Payer: Self-pay | Admitting: *Deleted

## 2017-05-03 ENCOUNTER — Ambulatory Visit
Admission: RE | Admit: 2017-05-03 | Discharge: 2017-05-03 | Disposition: A | Discharge status: Home / Self Care | Payer: Medicare (Managed Care) | Source: Ambulatory Visit | Attending: Ophthalmology | Admitting: Ophthalmology

## 2017-05-03 ENCOUNTER — Encounter: Payer: Self-pay | Admitting: Ophthalmology

## 2017-05-03 ENCOUNTER — Ambulatory Visit: Payer: Medicare (Managed Care) | Admitting: Anesthesiology

## 2017-05-03 ENCOUNTER — Encounter
Admission: RE | Disposition: A | Discharge status: Home / Self Care | Payer: Self-pay | Source: Ambulatory Visit | Attending: Ophthalmology

## 2017-05-03 DIAGNOSIS — Z7982 Long term (current) use of aspirin: Secondary | ICD-10-CM | POA: Diagnosis not present

## 2017-05-03 DIAGNOSIS — M1990 Unspecified osteoarthritis, unspecified site: Secondary | ICD-10-CM | POA: Insufficient documentation

## 2017-05-03 DIAGNOSIS — I509 Heart failure, unspecified: Secondary | ICD-10-CM | POA: Diagnosis not present

## 2017-05-03 DIAGNOSIS — G4730 Sleep apnea, unspecified: Secondary | ICD-10-CM | POA: Diagnosis not present

## 2017-05-03 DIAGNOSIS — J449 Chronic obstructive pulmonary disease, unspecified: Secondary | ICD-10-CM | POA: Insufficient documentation

## 2017-05-03 DIAGNOSIS — Z880 Allergy status to penicillin: Secondary | ICD-10-CM | POA: Diagnosis not present

## 2017-05-03 DIAGNOSIS — E7800 Pure hypercholesterolemia, unspecified: Secondary | ICD-10-CM | POA: Insufficient documentation

## 2017-05-03 DIAGNOSIS — H2511 Age-related nuclear cataract, right eye: Secondary | ICD-10-CM | POA: Insufficient documentation

## 2017-05-03 DIAGNOSIS — Z951 Presence of aortocoronary bypass graft: Secondary | ICD-10-CM | POA: Insufficient documentation

## 2017-05-03 DIAGNOSIS — I110 Hypertensive heart disease with heart failure: Secondary | ICD-10-CM | POA: Diagnosis not present

## 2017-05-03 DIAGNOSIS — Z888 Allergy status to other drugs, medicaments and biological substances status: Secondary | ICD-10-CM | POA: Insufficient documentation

## 2017-05-03 DIAGNOSIS — Z79899 Other long term (current) drug therapy: Secondary | ICD-10-CM | POA: Insufficient documentation

## 2017-05-03 DIAGNOSIS — Z955 Presence of coronary angioplasty implant and graft: Secondary | ICD-10-CM | POA: Insufficient documentation

## 2017-05-03 DIAGNOSIS — Z791 Long term (current) use of non-steroidal anti-inflammatories (NSAID): Secondary | ICD-10-CM | POA: Insufficient documentation

## 2017-05-03 DIAGNOSIS — F4310 Post-traumatic stress disorder, unspecified: Secondary | ICD-10-CM | POA: Insufficient documentation

## 2017-05-03 DIAGNOSIS — I2510 Atherosclerotic heart disease of native coronary artery without angina pectoris: Secondary | ICD-10-CM | POA: Insufficient documentation

## 2017-05-03 HISTORY — DX: Dizziness and giddiness: R42

## 2017-05-03 HISTORY — DX: Post-traumatic stress disorder, unspecified: F43.10

## 2017-05-03 HISTORY — PX: CATARACT EXTRACTION W/PHACO: SHX586

## 2017-05-03 HISTORY — DX: Other specified disorders of veins: I87.8

## 2017-05-03 HISTORY — DX: Atresia of esophagus without fistula: Q39.0

## 2017-05-03 HISTORY — DX: Edema, unspecified: R60.9

## 2017-05-03 HISTORY — DX: Angina pectoris, unspecified: I20.9

## 2017-05-03 HISTORY — DX: Pain, unspecified: R52

## 2017-05-03 HISTORY — DX: Unspecified background retinopathy: H35.00

## 2017-05-03 LAB — GLUCOSE, CAPILLARY: Glucose-Capillary: 200 mg/dL — ABNORMAL HIGH (ref 65–99)

## 2017-05-03 SURGERY — PHACOEMULSIFICATION, CATARACT, WITH IOL INSERTION
Anesthesia: Monitor Anesthesia Care | Site: Eye | Laterality: Right | Wound class: Clean

## 2017-05-03 MED ORDER — LIDOCAINE HCL (PF) 4 % IJ SOLN
INTRAOCULAR | Status: DC | PRN
  Administered 2017-05-03: 4 mL via OPHTHALMIC

## 2017-05-03 MED ORDER — CARBACHOL 0.01 % IO SOLN
INTRAOCULAR | Status: DC | PRN
  Administered 2017-05-03: 0.5 mL via INTRAOCULAR

## 2017-05-03 MED ORDER — FENTANYL CITRATE (PF) 100 MCG/2ML IJ SOLN
INTRAMUSCULAR | Status: DC | PRN
  Administered 2017-05-03: 50 ug via INTRAVENOUS

## 2017-05-03 MED ORDER — NEOMYCIN-POLYMYXIN-DEXAMETH 3.5-10000-0.1 OP OINT
TOPICAL_OINTMENT | OPHTHALMIC | Status: AC
  Filled 2017-05-03: qty 3.5

## 2017-05-03 MED ORDER — MOXIFLOXACIN HCL 0.5 % OP SOLN
OPHTHALMIC | Status: AC
Start: 2017-05-03 — End: 2017-05-03
  Administered 2017-05-03: 1 [drp] via OPHTHALMIC
  Filled 2017-05-03: qty 3

## 2017-05-03 MED ORDER — EPINEPHRINE PF 1 MG/ML IJ SOLN
INTRAOCULAR | Status: DC | PRN
  Administered 2017-05-03: 08:00:00 via OPHTHALMIC

## 2017-05-03 MED ORDER — POVIDONE-IODINE 5 % OP SOLN
OPHTHALMIC | Status: DC | PRN
  Administered 2017-05-03: 1 via OPHTHALMIC

## 2017-05-03 MED ORDER — MOXIFLOXACIN HCL 0.5 % OP SOLN
OPHTHALMIC | Status: AC
  Filled 2017-05-03: qty 3

## 2017-05-03 MED ORDER — SODIUM CHLORIDE 0.9 % IV SOLN
INTRAVENOUS | Status: DC
  Administered 2017-05-03: 06:00:00 via INTRAVENOUS

## 2017-05-03 MED ORDER — MIDAZOLAM HCL 2 MG/2ML IJ SOLN
INTRAMUSCULAR | Status: AC
  Filled 2017-05-03: qty 2

## 2017-05-03 MED ORDER — MIDAZOLAM HCL 2 MG/2ML IJ SOLN
INTRAMUSCULAR | Status: DC | PRN
  Administered 2017-05-03 (×2): 1 mg via INTRAVENOUS

## 2017-05-03 MED ORDER — NEOMYCIN-POLYMYXIN-DEXAMETH 0.1 % OP OINT
TOPICAL_OINTMENT | OPHTHALMIC | Status: DC | PRN
  Administered 2017-05-03: 1 via OPHTHALMIC

## 2017-05-03 MED ORDER — NA HYALUR & NA CHOND-NA HYALUR 0.55-0.5 ML IO KIT
PACK | INTRAOCULAR | Status: AC
  Filled 2017-05-03: qty 1.05

## 2017-05-03 MED ORDER — ARMC OPHTHALMIC DILATING DROPS
1.0000 "application " | OPHTHALMIC | Status: AC
  Administered 2017-05-03 (×3): 1 via OPHTHALMIC

## 2017-05-03 MED ORDER — NA CHONDROIT SULF-NA HYALURON 40-17 MG/ML IO SOLN
INTRAOCULAR | Status: AC
  Filled 2017-05-03: qty 1

## 2017-05-03 MED ORDER — FENTANYL CITRATE (PF) 100 MCG/2ML IJ SOLN
INTRAMUSCULAR | Status: AC
  Filled 2017-05-03: qty 2

## 2017-05-03 MED ORDER — EPINEPHRINE PF 1 MG/ML IJ SOLN
INTRAMUSCULAR | Status: AC
  Filled 2017-05-03: qty 2

## 2017-05-03 MED ORDER — NA HYALUR & NA CHOND-NA HYALUR 0.4-0.35 ML IO KIT
PACK | INTRAOCULAR | Status: DC | PRN
  Administered 2017-05-03: .35 mL via INTRAOCULAR

## 2017-05-03 MED ORDER — MOXIFLOXACIN HCL 0.5 % OP SOLN
1.0000 [drp] | OPHTHALMIC | Status: AC
  Administered 2017-05-03 (×3): 1 [drp] via OPHTHALMIC

## 2017-05-03 MED ORDER — ARMC OPHTHALMIC DILATING DROPS
OPHTHALMIC | Status: AC
  Administered 2017-05-03: 1 via OPHTHALMIC
  Filled 2017-05-03: qty 0.4

## 2017-05-03 MED ORDER — LIDOCAINE HCL (PF) 4 % IJ SOLN
INTRAMUSCULAR | Status: AC
  Filled 2017-05-03: qty 5

## 2017-05-03 MED ORDER — POVIDONE-IODINE 5 % OP SOLN
OPHTHALMIC | Status: AC
  Filled 2017-05-03: qty 30

## 2017-05-03 SURGICAL SUPPLY — 16 items
GLOVE BIO SURGEON STRL SZ8 (GLOVE) ×2 IMPLANT
GLOVE BIOGEL M 6.5 STRL (GLOVE) ×2 IMPLANT
GLOVE SURG LX 7.5 STRW (GLOVE) ×1
GLOVE SURG LX STRL 7.5 STRW (GLOVE) ×1 IMPLANT
GOWN STRL REUS W/ TWL LRG LVL3 (GOWN DISPOSABLE) ×2 IMPLANT
GOWN STRL REUS W/TWL LRG LVL3 (GOWN DISPOSABLE) ×2
LABEL CATARACT MEDS ST (LABEL) ×2 IMPLANT
LENS IOL TECNIS ITEC 21.0 (Intraocular Lens) ×2 IMPLANT
PACK CATARACT (MISCELLANEOUS) ×2 IMPLANT
PACK CATARACT BRASINGTON LX (MISCELLANEOUS) ×2 IMPLANT
PACK EYE AFTER SURG (MISCELLANEOUS) ×2 IMPLANT
SOL BSS BAG (MISCELLANEOUS) ×2
SOLUTION BSS BAG (MISCELLANEOUS) ×1 IMPLANT
SYR 5ML LL (SYRINGE) ×2 IMPLANT
WATER STERILE IRR 250ML POUR (IV SOLUTION) ×2 IMPLANT
WIPE NON LINTING 3.25X3.25 (MISCELLANEOUS) ×2 IMPLANT

## 2017-05-03 NOTE — Op Note (Signed)
OPERATIVE NOTE  AXEL FRISK [id number] 05/03/2017   PREOPERATIVE DIAGNOSIS:  Nuclear Sclerotic Cataract Right Eye H25.11   POSTOPERATIVE DIAGNOSIS: Nuclear Sclerotic Cataract Right Eye H25.11          PROCEDURE:  Phacoemusification with posterior chamber intraocular lens placement of the right eye   LENS:   Implant Name Type Inv. Item Serial No. Manufacturer Lot No. LRB No. Used  LENS IOL DIOP 21.0 - [id number] Intraocular Lens LENS IOL DIOP 21.0 [id number]   Right 1       ULTRASOUND TIME: 12 %  of 0 minutes 35 seconds, CDE 4.3  SURGEON:  Wyonia Hough, MD   ANESTHESIA:  Topical with tetracaine drops and 2% Xylocaine jelly, augmented with 1% preservative-free intracameral lidocaine.    COMPLICATIONS:  None.   DESCRIPTION OF PROCEDURE:  The patient was identified in the holding room and transported to the operating room and placed in the supine position under the operating microscope. Theright eye was identified as the operative eye and it was prepped and draped in the usual sterile ophthalmic fashion.   A 1 millimeter clear-corneal paracentesis was made at the 12:00 position.  0.5 ml of preservative-free 1% lidocaine was injected into the anterior chamber. The anterior chamber was filled with Viscoat viscoelastic.  A 2.4 millimeter keratome was used to make a near-clear corneal incision at the 9:00 position. A curvilinear capsulorrhexis was made with a cystotome and capsulorrhexis forceps.  Balanced salt solution was used to hydrodissect and hydrodelineate the nucleus.   Phacoemulsification was then used in stop and chop fashion to remove the lens nucleus and epinucleus.  The remaining cortex was then removed using the irrigation and aspiration handpiece. Provisc was then placed into the capsular bag to distend it for lens placement.  A lens was then injected into the capsular bag.  The remaining viscoelastic was aspirated.  Wounds were hydrated with balanced  salt solution.  The anterior chamber was inflated to a physiologic pressure with balanced salt solution. Vigamox 0.2 ml of a 1mg  per ml solution was injected into the anterior chamber for a dose of 0.2 mg of intracameral antibiotic at the completion of the case. Miostat was placed into the anterior chamber to constrict the pupil.  No wound leaks were noted.  Topical Vigamox drops and Maxitrol ointment were applied to the eye.  The patient was taken to the recovery room in stable condition without complications of anesthesia or surgery.  Vandy Tsuchiya 05/03/2017, 8:03 AM

## 2017-05-03 NOTE — H&P (Signed)
The History and Physical notes are on paper, have been signed, and are to be scanned. The patient remains stable and unchanged from the H&P.   Previous H&P reviewed, patient examined, and there are no changes.  Evan [name] 05/03/2017 7:34 AM

## 2017-05-03 NOTE — Transfer of Care (Signed)
Immediate Anesthesia Transfer of Care Note  Patient: Evan [name]  Procedure(s) Performed: Procedure(s) with comments: CATARACT EXTRACTION PHACO AND INTRAOCULAR LENS PLACEMENT (IOC) (Right) - [place] 00:35.3 AP% 12.3 CDE 4.33 Fluid Pack lot # [id number]       Patient Location: PACU and Short Stay  Anesthesia Type:MAC  Level of Consciousness: awake, alert  and oriented  Airway & Oxygen Therapy: Patient Spontanous Breathing  Post-op Assessment: Report given to RN and Post -op Vital signs reviewed and stable  Post vital signs: Reviewed and stable  Last Vitals:  Vitals:   05/03/17 0612 05/03/17 0806  BP: (!) 160/87 130/69  Pulse: 63 71  Resp: 18 18  Temp: (!) 36.3 C   SpO2: 98% 98%    Last Pain:  Vitals:   05/03/17 0612  TempSrc: Tympanic         Complications: No apparent anesthesia complications

## 2017-05-03 NOTE — Anesthesia Preprocedure Evaluation (Signed)
Anesthesia Evaluation  Patient identified by MRN, date of birth, ID band Patient awake    Reviewed: Allergy & Precautions, NPO status , Patient's Chart, lab work & pertinent test results  History of Anesthesia Complications Negative for: history of anesthetic complications  Airway Mallampati: III       Dental   Pulmonary asthma , sleep apnea and Continuous Positive Airway Pressure Ventilation , COPD,  COPD inhaler,           Cardiovascular hypertension, Pt. on medications and Pt. on home beta blockers + CAD, + Past MI, + Cardiac Stents, + CABG, + Peripheral Vascular Disease and +CHF       Neuro/Psych neg Seizures Anxiety PTSD, panic attacks   GI/Hepatic Neg liver ROS, GERD  Medicated and Poorly Controlled,  Endo/Other  diabetes, Type 2, Oral Hypoglycemic Agents  Renal/GU negative Renal ROS     Musculoskeletal   Abdominal   Peds  Hematology   Anesthesia Other Findings   Reproductive/Obstetrics                             Anesthesia Physical Anesthesia Plan  ASA: III  Anesthesia Plan: MAC   Post-op Pain Management:    Induction: Intravenous  PONV Risk Score and Plan:   Airway Management Planned: Nasal Cannula  Additional Equipment:   Intra-op Plan:   Post-operative Plan:   Informed Consent: I have reviewed the patients History and Physical, chart, labs and discussed the procedure including the risks, benefits and alternatives for the proposed anesthesia with the patient or authorized representative who has indicated his/her understanding and acceptance.     Plan Discussed with:   Anesthesia Plan Comments:         Anesthesia Quick Evaluation

## 2017-05-03 NOTE — Anesthesia Post-op Follow-up Note (Signed)
Anesthesia QCDR form completed.        

## 2017-05-03 NOTE — Anesthesia Postprocedure Evaluation (Signed)
Anesthesia Post Note  Patient: Evan [name]  Procedure(s) Performed: Procedure(s) (LRB): CATARACT EXTRACTION PHACO AND INTRAOCULAR LENS PLACEMENT (IOC) (Right)  Patient location during evaluation: Short Stay Anesthesia Type: MAC Level of consciousness: awake, awake and alert and oriented Pain management: pain level controlled Vital Signs Assessment: post-procedure vital signs reviewed and stable Respiratory status: spontaneous breathing and nonlabored ventilation Cardiovascular status: stable Postop Assessment: no headache and adequate PO intake Anesthetic complications: no     Last Vitals:  Vitals:   05/03/17 0612 05/03/17 0806  BP: (!) 160/87 130/69  Pulse: 63 70  Resp: 18 16  Temp: (!) 36.3 C 36.4 C  SpO2: 98% 99%    Last Pain:  Vitals:   05/03/17 0806  TempSrc: Filbert Berthold

## 2017-05-03 NOTE — Discharge Instructions (Signed)
Eye Surgery Discharge Instructions  Expect mild scratchy sensation or mild soreness. DO NOT RUB YOUR EYE!  The day of surgery:  Minimal physical activity, but bed rest is not required  No reading, computer work, or close hand work  No bending, lifting, or straining.  May watch TV  For 24 hours:  No driving, legal decisions, or alcoholic beverages  Safety precautions  Eat anything you prefer: It is better to start with liquids, then soup then solid foods.  _____ Eye patch should be worn until postoperative exam tomorrow.  __x__ Solar shield eyeglasses should be worn for comfort in the sunlight/patch while sleeping  Resume all regular medications including aspirin or Coumadin if these were discontinued prior to surgery. You may shower, bathe, shave, or wash your hair. Tylenol may be taken for mild discomfort.  Call your doctor if you experience significant pain, nausea, or vomiting, fever > 101 or other signs of infection. [phone] or [phone] Specific instructions:  Follow-up Information    Leandrew Koyanagi, MD Follow up on 05/04/2017.   Specialty:  Ophthalmology Why:  follow up appointment at 11:05 am in the office Contact information: [address]   [place] [address] [phone]

## 2017-08-30 ENCOUNTER — Ambulatory Visit (INDEPENDENT_AMBULATORY_CARE_PROVIDER_SITE_OTHER): Payer: Medicare (Managed Care) | Admitting: Urology

## 2017-08-30 ENCOUNTER — Encounter: Payer: Self-pay | Admitting: Urology

## 2017-08-30 VITALS — BP 105/68 | HR 93

## 2017-08-30 DIAGNOSIS — R972 Elevated prostate specific antigen [PSA]: Secondary | ICD-10-CM | POA: Diagnosis not present

## 2017-08-30 LAB — URINALYSIS, COMPLETE
Bilirubin, UA: NEGATIVE
Glucose, UA: NEGATIVE
Ketones, UA: NEGATIVE
Leukocytes, UA: NEGATIVE
Nitrite, UA: NEGATIVE
Protein, UA: NEGATIVE
RBC, UA: NEGATIVE
Specific Gravity, UA: 1.015 (ref 1.005–1.030)
Urobilinogen, Ur: 0.2 mg/dL (ref 0.2–1.0)
pH, UA: 5.5 (ref 5.0–7.5)

## 2017-08-30 LAB — MICROSCOPIC EXAMINATION
Bacteria, UA: NONE SEEN
Epithelial Cells (non renal): NONE SEEN /hpf (ref 0–10)
WBC, UA: NONE SEEN /hpf (ref 0–?)

## 2017-08-30 NOTE — Progress Notes (Signed)
08/30/2017 2:01 PM   Evan [name] 1955/10/07 [id number]  Referring provider: Leticia Penna, MD [address] [place], [place] [address]  Chief Complaint  Patient presents with  . Elevated PSA    New Patient    HPI: Evan [name] is a 62 year old male seen in consultation at the request of Dr. Meredith Staggers for an elevated PSA.  A PSA drawn on 08/02/2017 was 28.7.  He denies previous history of urologic problems.  His records were not available for review at the time of this visit.  He has no voiding complaints and specifically denies frequency, urgency, dysuria or urinary incontinence.  He denies flank, pelvic or scrotal pain.  He does complain of intermittent abdominal cramping.   PMH: Past Medical History:  Diagnosis Date  . Anginal pain ([place])   . Anxiety disorder   . Asthma   . Atresia of esophagus without fistula   . CAD (coronary artery disease)   . Cellulitis   . CHF (congestive heart failure) ([name])    NYHA CLASS III,CHRONIC,DIASTOLIC  . COPD (chronic obstructive pulmonary disease) ([place])   . Diabetes mellitus without complication ([place])   . Edema    RIGHT LOWER LEG  . Gastroesophageal reflux   . H/O: GI bleed   . History of pneumonia    Remote  . History of scarlet fever    Childhood  . Hyperlipidemia   . Hypertension   . Myocardial infarction ([place]) 2009  . Obesity   . Obstructive sleep apnea   . Pain    CHRONIC BACK / ABDOMINAL  . Panic disorder   . Peripheral venous insufficiency   . PTSD (post-traumatic stress disorder)   . Retinopathy    DIABETIC  . Stasis, venous   . Vertigo     Surgical History: Past Surgical History:  Procedure Laterality Date  . CARDIAC CATHETERIZATION    . CATARACT EXTRACTION Left   . CATARACT EXTRACTION W/PHACO Right 05/03/2017   Procedure: CATARACT EXTRACTION PHACO AND INTRAOCULAR LENS PLACEMENT (IOC);  Surgeon: Leandrew Koyanagi, MD;  Location: [facility] ORS;  Service: Ophthalmology;  Laterality: Right;  [place]  00:35.3 AP% 12.3 CDE 4.33 Fluid Pack lot # [id number]       . CORONARY ANGIOPLASTY WITH STENT PLACEMENT  2002  . CORONARY ANGIOPLASTY WITH STENT PLACEMENT  1999  . CORONARY ARTERY BYPASS GRAFT     x7  . ESOPHAGOGASTRODUODENOSCOPY N/A 09/19/2016   Procedure: ESOPHAGOGASTRODUODENOSCOPY (EGD);  Surgeon: Lollie Sails, MD;  Location: [facility] ENDOSCOPY;  Service: Endoscopy;  Laterality: N/A;    Home Medications:  Allergies as of 08/30/2017      Reactions   Penicillin G Hives   Sulfa Antibiotics Hives   Tiotropium    Zoloft [sertraline Hcl] Other (See Comments)      Medication List        Accurate as of 08/30/17  2:01 PM. Always use your most recent med list.          aspirin 81 MG EC tablet Take 81 mg by mouth 2 (two) times daily.   budesonide-formoterol 80-4.5 MCG/ACT inhaler Commonly known as:  SYMBICORT Inhale 2 puffs into the lungs 2 (two) times daily.   calcium carbonate 1500 (600 Ca) MG Tabs tablet Commonly known as:  OSCAL Take 1,500 mg by mouth 2 (two) times daily with a meal.   CLARITIN 10 MG tablet Generic drug:  loratadine Take 10 mg by mouth daily.   dicyclomine 10 MG capsule Commonly known as:  BENTYL Take 10  mg by mouth 2 (two) times daily. AS NEEDED   DULoxetine 30 MG capsule Commonly known as:  CYMBALTA Take 30 mg by mouth daily.   esomeprazole 40 MG capsule Commonly known as:  NEXIUM Take 40 mg by mouth daily.   fluticasone 50 MCG/ACT nasal spray Commonly known as:  FLONASE Place 2 sprays into both nostrils daily.   furosemide 40 MG tablet Commonly known as:  LASIX 40 mg daily. Take 80 mg in the am with 40 mg in the pm.   hydrOXYzine 25 MG tablet Commonly known as:  ATARAX/VISTARIL Take 25 mg by mouth at bedtime.   ibuprofen 600 MG tablet Commonly known as:  ADVIL,MOTRIN Take 600 mg by mouth every 6 (six) hours as needed.   ipratropium-albuterol 0.5-2.5 (3) MG/3ML Soln Commonly known as:  DUONEB Take 3 mLs by nebulization every 4  (four) hours as needed.   lisinopril 5 MG tablet Commonly known as:  PRINIVIL,ZESTRIL Take 5 mg by mouth daily.   meclizine 25 MG tablet Commonly known as:  ANTIVERT Take 25 mg by mouth.   metFORMIN 500 MG tablet Commonly known as:  GLUCOPHAGE Take 1 tablet (500 mg total) by mouth 2 (two) times daily with a meal.   metoprolol tartrate 25 MG tablet Commonly known as:  LOPRESSOR Take 50 mg by mouth daily.   nitroGLYCERIN 0.4 MG SL tablet Commonly known as:  NITROSTAT Place 0.4 mg under the tongue every 5 (five) minutes as needed for chest pain.   pantoprazole 40 MG tablet Commonly known as:  PROTONIX Take 40 mg by mouth 2 (two) times daily.   potassium chloride 10 MEQ tablet Commonly known as:  K-DUR Take 1 tablet (10 mEq total) by mouth daily.   predniSONE 20 MG tablet Commonly known as:  DELTASONE Take 20 mg by mouth 3 (three) times daily as needed.   PROAIR HFA 108 (90 Base) MCG/ACT inhaler Generic drug:  albuterol Inhale 1 puff into the lungs every 4 (four) hours as needed.   ranitidine 150 MG capsule Commonly known as:  ZANTAC Take 150 mg by mouth 2 (two) times daily.   rosuvastatin 20 MG tablet Commonly known as:  CRESTOR Take 20 mg by mouth daily.   sucralfate 1 g tablet Commonly known as:  CARAFATE Take 1 g by mouth 2 (two) times daily.   TOPROL XL 50 MG 24 hr tablet Generic drug:  metoprolol succinate Take by mouth.       Allergies:  Allergies  Allergen Reactions  . Penicillin G Hives  . Sulfa Antibiotics Hives  . Tiotropium   . Zoloft [Sertraline Hcl] Other (See Comments)    Family History: Family History  Problem Relation Age of Onset  . Heart attack Mother   . Hypertension Mother   . Hyperlipidemia Mother   . Heart attack Brother 83       MI  . Coronary artery disease Other     Social History:  reports that  has never smoked. he has never used smokeless tobacco. He reports that he does not drink alcohol or use  drugs.  ROS: UROLOGY Frequent Urination?: No Hard to postpone urination?: No Burning/pain with urination?: No Get up at night to urinate?: No Leakage of urine?: No Urine stream starts and stops?: No Trouble starting stream?: No Do you have to strain to urinate?: No Blood in urine?: No Urinary tract infection?: No Sexually transmitted disease?: No Injury to kidneys or bladder?: No Painful intercourse?: No Weak stream?: No Erection  problems?: No Penile pain?: No  Gastrointestinal Nausea?: No Vomiting?: No Indigestion/heartburn?: Yes Diarrhea?: No Constipation?: No  Constitutional Fever: No Night sweats?: No Weight loss?: No Fatigue?: No  Skin Skin rash/lesions?: No Itching?: No  Eyes Blurred vision?: No Double vision?: No  Ears/Nose/Throat Sore throat?: No Sinus problems?: Yes  Hematologic/Lymphatic Swollen glands?: No Easy bruising?: No  Cardiovascular Leg swelling?: Yes Chest pain?: Yes  Respiratory Cough?: Yes Shortness of breath?: Yes  Endocrine Excessive thirst?: No  Musculoskeletal Back pain?: Yes Joint pain?: Yes  Neurological Headaches?: Yes Dizziness?: Yes  Psychologic Depression?: No Anxiety?: Yes  Physical Exam: BP 105/68   Pulse 93   Constitutional:  Alert and oriented, No acute distress. HEENT: [place] AT, moist mucus membranes.  Trachea midline, no masses. Cardiovascular: No clubbing, cyanosis, or edema. Respiratory: Normal respiratory effort, no increased work of breathing. GI: Abdomen is soft, nontender, nondistended, no abdominal masses GU: No CVA tenderness.  Prostate 35 g with some increased firmness left lateral aspect.  Penis without lesions, testes descended bilaterally without masses or tenderness. Skin: No rashes, bruises or suspicious lesions. Lymph: No cervical or inguinal adenopathy. Neurologic: Grossly intact, no focal deficits, moving all 4 extremities. Psychiatric: Normal mood and affect.  Laboratory  Data: Lab Results  Component Value Date   WBC 13.1 (H) 11/20/2015   HGB 13.8 11/20/2015   HCT 41.2 11/20/2015   MCV 86.4 11/20/2015   PLT 285 11/20/2015    Lab Results  Component Value Date   CREATININE 0.96 11/20/2015    Lab Results  Component Value Date   HGBA1C 6.3 07/26/2011    Urinalysis Dipstick/microscopy negative   Assessment & Plan:    1. Elevated PSA Recent PSA was significantly elevated.  Urinalysis today was clear.  His PSA will be repeated today and if persistently elevated have recommended Scheduling TRUS/ prostate biopsy. The procedure was discussed in detail including potential risks of bleeding and infection/sepsis.  - Urinalysis, Complete - PSA    Abbie Sons, MD  [facility] [address] [place], [place] [address] [phone]

## 2017-08-31 ENCOUNTER — Other Ambulatory Visit: Payer: Self-pay

## 2017-08-31 LAB — PSA: Prostate Specific Ag, Serum: 30 ng/mL — ABNORMAL HIGH (ref 0.0–4.0)

## 2017-09-02 ENCOUNTER — Encounter: Payer: Self-pay | Admitting: Urology

## 2017-09-03 ENCOUNTER — Telehealth: Payer: Self-pay | Admitting: Family Medicine

## 2017-09-03 NOTE — Telephone Encounter (Signed)
-----   Message from Abbie Sons, MD sent at 09/02/2017  2:46 PM EST ----- Repeat PSA persistently elevated at 30.  Recommend scheduling prostate biopsy.

## 2017-09-03 NOTE — Telephone Encounter (Addendum)
Patient notified and he asked that I fax all Biopsy information to [facility]. This has been done. His nurse said they will have the scheduler call to make the appointment.

## 2017-09-12 ENCOUNTER — Other Ambulatory Visit: Payer: Medicaid Other | Admitting: Urology

## 2017-09-19 ENCOUNTER — Ambulatory Visit (INDEPENDENT_AMBULATORY_CARE_PROVIDER_SITE_OTHER): Payer: Medicare (Managed Care) | Admitting: Urology

## 2017-09-19 ENCOUNTER — Encounter: Payer: Self-pay | Admitting: Urology

## 2017-09-19 ENCOUNTER — Other Ambulatory Visit: Payer: Self-pay | Admitting: Urology

## 2017-09-19 VITALS — BP 117/71 | HR 99 | Ht 65.0 in | Wt 266.0 lb

## 2017-09-19 DIAGNOSIS — R972 Elevated prostate specific antigen [PSA]: Secondary | ICD-10-CM | POA: Diagnosis not present

## 2017-09-19 MED ORDER — GENTAMICIN SULFATE 40 MG/ML IJ SOLN
80.0000 mg | Freq: Once | INTRAMUSCULAR | Status: AC
  Administered 2017-09-19: 80 mg via INTRAMUSCULAR

## 2017-09-19 MED ORDER — LEVOFLOXACIN 500 MG PO TABS
500.0000 mg | ORAL_TABLET | Freq: Once | ORAL | Status: AC
  Administered 2017-09-19: 500 mg via ORAL

## 2017-09-20 NOTE — Progress Notes (Signed)
Prostate Biopsy Procedure   Informed consent was obtained after discussing risks/benefits of the procedure.  A time out was performed to ensure correct patient identity.  Pre-Procedure: - Last PSA Level: 30 (08/30/2017) - Gentamicin given prophylactically - Levaquin 500 mg administered PO -Transrectal Ultrasound performed revealing a 51 gm prostate -No significant hypoechoic or median lobe noted  Procedure: - Prostate block performed using 10 cc 1% lidocaine and biopsies taken from sextant areas, a total of 12 under ultrasound guidance.  Post-Procedure: - Patient tolerated the procedure well - He was counseled to seek immediate medical attention if experiences any severe pain, significant bleeding, or fevers - Return in one week to discuss biopsy results

## 2017-09-25 ENCOUNTER — Other Ambulatory Visit: Payer: Self-pay | Admitting: Urology

## 2017-09-25 LAB — PATHOLOGY REPORT

## 2017-09-26 ENCOUNTER — Ambulatory Visit: Payer: Medicaid Other | Admitting: Urology

## 2017-10-04 ENCOUNTER — Encounter: Payer: Self-pay | Admitting: Urology

## 2017-10-04 ENCOUNTER — Ambulatory Visit (INDEPENDENT_AMBULATORY_CARE_PROVIDER_SITE_OTHER): Payer: Medicare (Managed Care) | Admitting: Urology

## 2017-10-04 VITALS — BP 102/66 | HR 85 | Ht 65.0 in | Wt 267.0 lb

## 2017-10-04 DIAGNOSIS — C61 Malignant neoplasm of prostate: Secondary | ICD-10-CM

## 2017-10-04 NOTE — Progress Notes (Signed)
10/04/2017 11:50 AM   Evan [name] Apr 02, 1956 [id number]  Referring provider: No referring provider defined for this encounter.  Chief Complaint  Patient presents with  . Results    HPI: 62 year old male presents for prostate biopsy results.  Biopsy was performed on 09/19/2017 for a PSA of 30.  DRE with firmness left lateral prostate.  He had no post biopsy complaints.  Prostate volume was calculated at 51 cc.  Standard 12 core biopsies were performed.  Pathology: 12/12 cores positive for predominantly Gleason 4+4 adenocarcinoma.  There were areas showing 3+3, 3+4 and 4+3.  The majority of the biopsy showed tissue involvement at greater than 88%.  PMH: Past Medical History:  Diagnosis Date  . Anginal pain ([place])   . Anxiety disorder   . Asthma   . Atresia of esophagus without fistula   . CAD (coronary artery disease)   . Cellulitis   . CHF (congestive heart failure) ([place])    NYHA CLASS III,CHRONIC,DIASTOLIC  . COPD (chronic obstructive pulmonary disease) ([place])   . Diabetes mellitus without complication ([place])   . Edema    RIGHT LOWER LEG  . Gastroesophageal reflux   . H/O: GI bleed   . History of pneumonia    Remote  . History of scarlet fever    Childhood  . Hyperlipidemia   . Hypertension   . Myocardial infarction ([place]) 2009  . Obesity   . Obstructive sleep apnea   . Pain    CHRONIC BACK / ABDOMINAL  . Panic disorder   . Peripheral venous insufficiency   . PTSD (post-traumatic stress disorder)   . Retinopathy    DIABETIC  . Stasis, venous   . Vertigo     Surgical History: Past Surgical History:  Procedure Laterality Date  . CARDIAC CATHETERIZATION    . CATARACT EXTRACTION Left   . CATARACT EXTRACTION W/PHACO Right 05/03/2017   Procedure: CATARACT EXTRACTION PHACO AND INTRAOCULAR LENS PLACEMENT (IOC);  Surgeon: Leandrew Koyanagi, MD;  Location: [facility] ORS;  Service: Ophthalmology;  Laterality: Right;  [place] 00:35.3 AP% 12.3 CDE 4.33 Fluid Pack lot #  [id number]       . CORONARY ANGIOPLASTY WITH STENT PLACEMENT  2002  . CORONARY ANGIOPLASTY WITH STENT PLACEMENT  1999  . CORONARY ARTERY BYPASS GRAFT     x7  . ESOPHAGOGASTRODUODENOSCOPY N/A 09/19/2016   Procedure: ESOPHAGOGASTRODUODENOSCOPY (EGD);  Surgeon: Lollie Sails, MD;  Location: [facility] ENDOSCOPY;  Service: Endoscopy;  Laterality: N/A;    Home Medications:  Allergies as of 10/04/2017      Reactions   Penicillin G Hives   Sulfa Antibiotics Hives   Tiotropium    Zoloft [sertraline Hcl] Other (See Comments)      Medication List        Accurate as of 10/04/17 11:50 AM. Always use your most recent med list.          aspirin 81 MG EC tablet Take 81 mg by mouth 2 (two) times daily.   budesonide-formoterol 80-4.5 MCG/ACT inhaler Commonly known as:  SYMBICORT Inhale 2 puffs into the lungs 2 (two) times daily.   calcium carbonate 1500 (600 Ca) MG Tabs tablet Commonly known as:  OSCAL Take 1,500 mg by mouth 2 (two) times daily with a meal.   CLARITIN 10 MG tablet Generic drug:  loratadine Take 10 mg by mouth daily.   dicyclomine 10 MG capsule Commonly known as:  BENTYL Take 10 mg by mouth 2 (two) times daily. AS NEEDED   DULoxetine  30 MG capsule Commonly known as:  CYMBALTA Take 30 mg by mouth daily.   esomeprazole 40 MG capsule Commonly known as:  NEXIUM Take 40 mg by mouth daily.   fluticasone 50 MCG/ACT nasal spray Commonly known as:  FLONASE Place 2 sprays into both nostrils daily.   furosemide 40 MG tablet Commonly known as:  LASIX 40 mg daily. Take 80 mg in the am with 40 mg in the pm.   hydrOXYzine 25 MG tablet Commonly known as:  ATARAX/VISTARIL Take 25 mg by mouth at bedtime.   ibuprofen 600 MG tablet Commonly known as:  ADVIL,MOTRIN Take 600 mg by mouth every 6 (six) hours as needed.   ipratropium-albuterol 0.5-2.5 (3) MG/3ML Soln Commonly known as:  DUONEB Take 3 mLs by nebulization every 4 (four) hours as needed.   lisinopril 5 MG  tablet Commonly known as:  PRINIVIL,ZESTRIL Take 5 mg by mouth daily.   meclizine 25 MG tablet Commonly known as:  ANTIVERT Take 25 mg by mouth.   metFORMIN 500 MG tablet Commonly known as:  GLUCOPHAGE Take 1 tablet (500 mg total) by mouth 2 (two) times daily with a meal.   metoprolol tartrate 25 MG tablet Commonly known as:  LOPRESSOR Take 50 mg by mouth daily.   nitroGLYCERIN 0.4 MG SL tablet Commonly known as:  NITROSTAT Place 0.4 mg under the tongue every 5 (five) minutes as needed for chest pain.   pantoprazole 40 MG tablet Commonly known as:  PROTONIX Take 40 mg by mouth 2 (two) times daily.   potassium chloride 10 MEQ tablet Commonly known as:  K-DUR Take 1 tablet (10 mEq total) by mouth daily.   predniSONE 20 MG tablet Commonly known as:  DELTASONE Take 20 mg by mouth 3 (three) times daily as needed.   PROAIR HFA 108 (90 Base) MCG/ACT inhaler Generic drug:  albuterol Inhale 1 puff into the lungs every 4 (four) hours as needed.   ranitidine 150 MG capsule Commonly known as:  ZANTAC Take 150 mg by mouth 2 (two) times daily.   rosuvastatin 20 MG tablet Commonly known as:  CRESTOR Take 20 mg by mouth daily.   sucralfate 1 g tablet Commonly known as:  CARAFATE Take 1 g by mouth 2 (two) times daily.   TOPROL XL 50 MG 24 hr tablet Generic drug:  metoprolol succinate Take by mouth.       Allergies:  Allergies  Allergen Reactions  . Penicillin G Hives  . Sulfa Antibiotics Hives  . Tiotropium   . Zoloft [Sertraline Hcl] Other (See Comments)    Family History: Family History  Problem Relation Age of Onset  . Heart attack Mother   . Hypertension Mother   . Hyperlipidemia Mother   . Heart attack Brother 25       MI  . Coronary artery disease Other     Social History:  reports that  has never smoked. he has never used smokeless tobacco. He reports that he does not drink alcohol or use drugs.  ROS: UROLOGY Frequent Urination?: No Hard to  postpone urination?: No Burning/pain with urination?: Yes Get up at night to urinate?: Yes Leakage of urine?: No Urine stream starts and stops?: No Trouble starting stream?: No Do you have to strain to urinate?: No Blood in urine?: No Urinary tract infection?: No Sexually transmitted disease?: No Injury to kidneys or bladder?: No Painful intercourse?: No Weak stream?: No Erection problems?: No Penile pain?: No  Gastrointestinal Nausea?: No Vomiting?: No Indigestion/heartburn?:  Yes Diarrhea?: No Constipation?: No  Constitutional Fever: No Night sweats?: No Weight loss?: No Fatigue?: No  Skin Skin rash/lesions?: No Itching?: No  Eyes Blurred vision?: No Double vision?: No  Ears/Nose/Throat Sore throat?: No Sinus problems?: Yes  Hematologic/Lymphatic Swollen glands?: No Easy bruising?: No  Cardiovascular Leg swelling?: Yes Chest pain?: No  Respiratory Cough?: Yes Shortness of breath?: No  Endocrine Excessive thirst?: No  Musculoskeletal Back pain?: Yes Joint pain?: No  Neurological Headaches?: No Dizziness?: Yes  Psychologic Depression?: No Anxiety?: No  Physical Exam: BP 102/66   Pulse 85   Ht 5\' 5"  (1.651 m)   Wt 267 lb (121.1 kg)   BMI 44.43 kg/m   Constitutional:  Alert and oriented, No acute distress.  Laboratory Data: Lab Results  Component Value Date   WBC 13.1 (H) 11/20/2015   HGB 13.8 11/20/2015   HCT 41.2 11/20/2015   MCV 86.4 11/20/2015   PLT 285 11/20/2015    Lab Results  Component Value Date   CREATININE 0.96 11/20/2015    Lab Results  Component Value Date   PSA1 30.0 (H) 08/30/2017     Lab Results  Component Value Date   HGBA1C 6.3 07/26/2011    Urinalysis Lab Results  Component Value Date   SPECGRAV 1.015 08/30/2017   PHUR 5.5 08/30/2017   COLORU Yellow 08/30/2017   APPEARANCEUR Clear 08/30/2017   LEUKOCYTESUR Negative 08/30/2017   PROTEINUR Negative 08/30/2017   GLUCOSEU Negative 08/30/2017    KETONESU Negative 08/30/2017   RBCU Negative 08/30/2017   BILIRUBINUR Negative 08/30/2017   UUROB 0.2 08/30/2017   NITRITE Negative 08/30/2017    Lab Results  Component Value Date   LABMICR See below: 08/30/2017   WBCUA None seen 08/30/2017   RBCUA 0-2 08/30/2017   LABEPIT None seen 08/30/2017   MUCUS Present (A) 08/30/2017   BACTERIA None seen 08/30/2017    Assessment & Plan:   62 year old male with T2 Nx Mx adenocarcinoma prostate, high risk.  The pathology report was discussed in detail.  Based on PSA level and Gleason score a CT of the abdomen and pelvis with contrast and bone scan will be ordered.  If there is no evidence of metastatic disease feel his best option would be radiation.  He is not an ideal candidate for radical prostatectomy due to medical comorbidities, body habitus and it is less likely radical prostatectomy would be curative.  Greater than 50% of this 15-minute visit was spent counseling.   Abbie Sons, [facility] [address] [place], [place] [address] [phone]

## 2017-10-05 ENCOUNTER — Other Ambulatory Visit: Payer: Self-pay | Admitting: Urology

## 2017-10-05 DIAGNOSIS — C61 Malignant neoplasm of prostate: Secondary | ICD-10-CM

## 2017-10-10 ENCOUNTER — Other Ambulatory Visit: Payer: Self-pay | Admitting: Family Medicine

## 2017-10-10 DIAGNOSIS — C61 Malignant neoplasm of prostate: Secondary | ICD-10-CM

## 2017-10-25 ENCOUNTER — Other Ambulatory Visit: Payer: Medicare (Managed Care)

## 2017-10-29 ENCOUNTER — Other Ambulatory Visit: Payer: Medicare (Managed Care)

## 2017-10-29 ENCOUNTER — Ambulatory Visit
Admission: RE | Admit: 2017-10-29 | Discharge: 2017-10-29 | Disposition: A | Discharge status: Home / Self Care | Payer: Medicare (Managed Care) | Source: Ambulatory Visit | Attending: Urology | Admitting: Urology

## 2017-10-29 ENCOUNTER — Encounter
Admission: RE | Admit: 2017-10-29 | Discharge: 2017-10-29 | Disposition: A | Discharge status: Home / Self Care | Payer: Medicare (Managed Care) | Source: Ambulatory Visit | Attending: Urology | Admitting: Urology

## 2017-10-29 DIAGNOSIS — R911 Solitary pulmonary nodule: Secondary | ICD-10-CM | POA: Diagnosis not present

## 2017-10-29 DIAGNOSIS — C61 Malignant neoplasm of prostate: Secondary | ICD-10-CM

## 2017-10-29 DIAGNOSIS — N2889 Other specified disorders of kidney and ureter: Secondary | ICD-10-CM | POA: Diagnosis not present

## 2017-10-29 DIAGNOSIS — K8020 Calculus of gallbladder without cholecystitis without obstruction: Secondary | ICD-10-CM | POA: Diagnosis not present

## 2017-10-29 DIAGNOSIS — I700 Atherosclerosis of aorta: Secondary | ICD-10-CM | POA: Diagnosis not present

## 2017-10-29 LAB — POCT I-STAT CREATININE: Creatinine, Ser: 0.8 mg/dL (ref 0.61–1.24)

## 2017-10-29 IMAGING — CT CT ABD-PELV W/ CM
2 of 5 series · 16 of 46 positions shown, 18 images · IV contrast (APPLIED)
Comparison: [DATE]

CLINICAL DATA: Prostate cancer staging

EXAM:
CT ABDOMEN AND PELVIS WITH CONTRAST
TECHNIQUE: Multidetector CT imaging of the abdomen and pelvis was performed
using the standard protocol following bolus administration of
intravenous contrast.
CONTRAST:  100mL [RJ] IOPAMIDOL ([RJ]) INJECTION 61%

[Series 2: routine abd/pel with · axial · 0.94mm/px · z∈[-669,-214]mm · 13 of 105 slices shown, 15 images]
[im 7/105  soft-tissue]
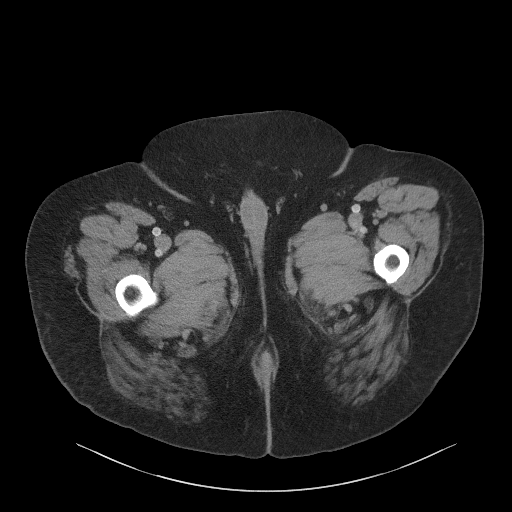
[im 7/105  bone]
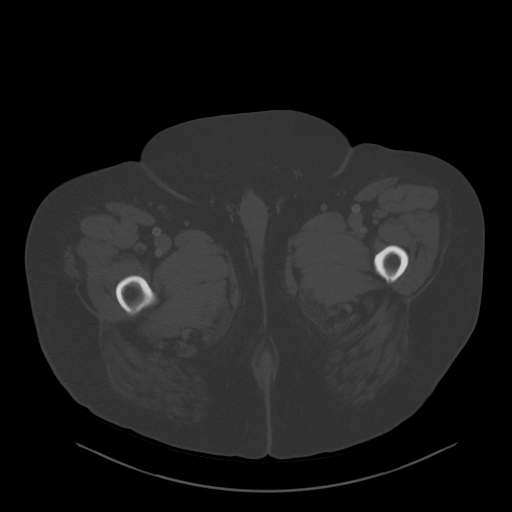
[im 13/105  soft-tissue]
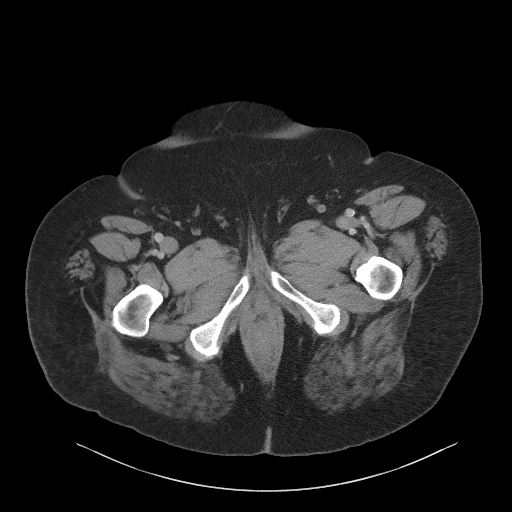
[im 25/105  soft-tissue]
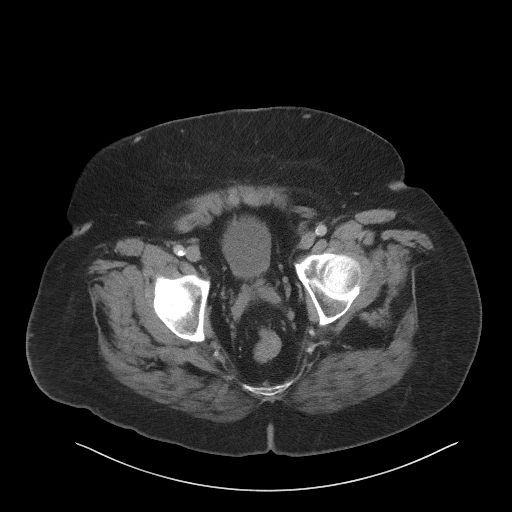
[im 31/105  soft-tissue]
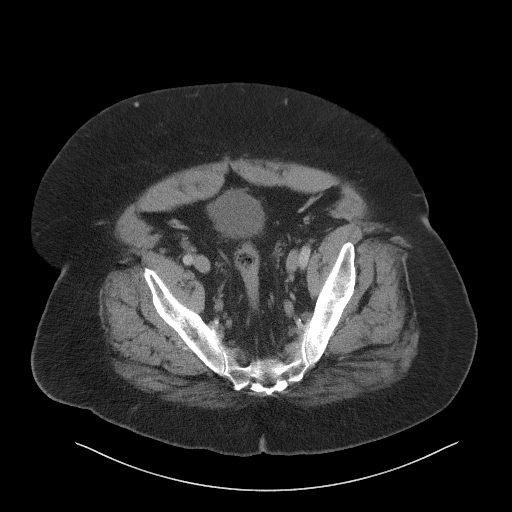
[im 37/105  soft-tissue]
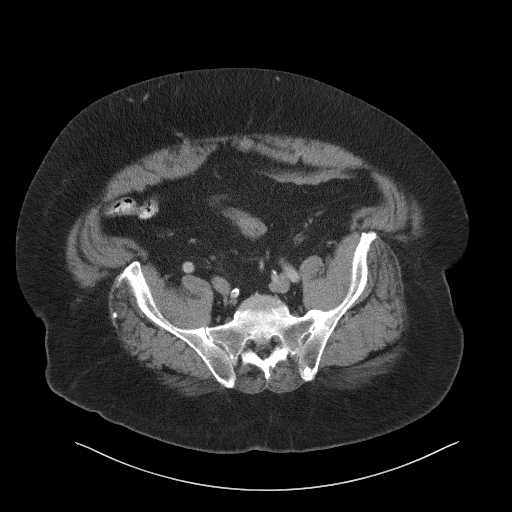
[im 43/105  soft-tissue]
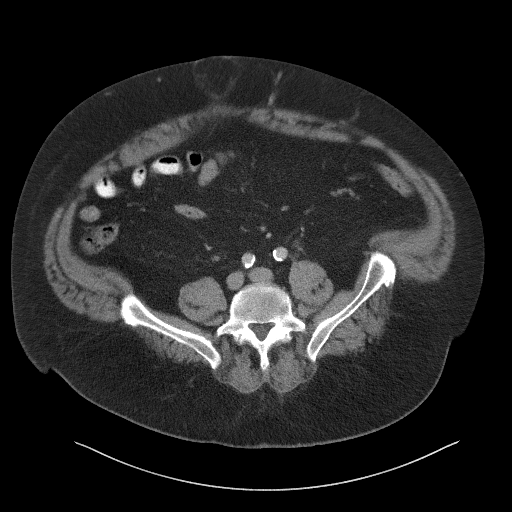
[im 56/105  soft-tissue]
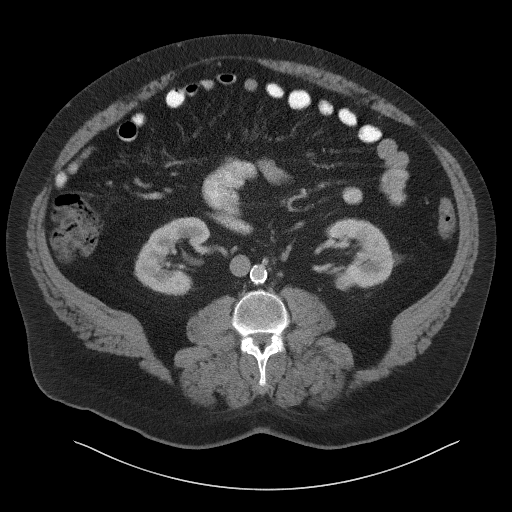
[im 62/105  soft-tissue]
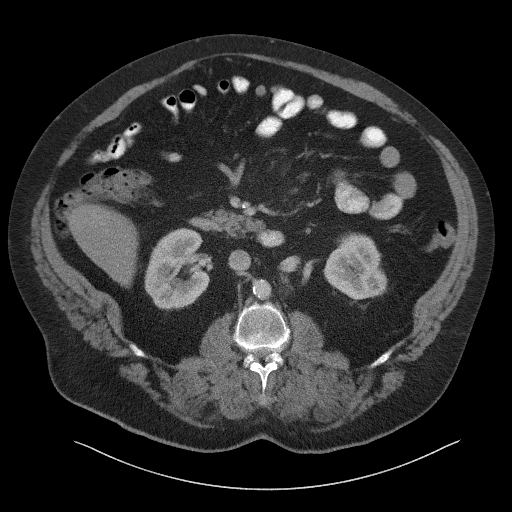
[im 68/105  soft-tissue]
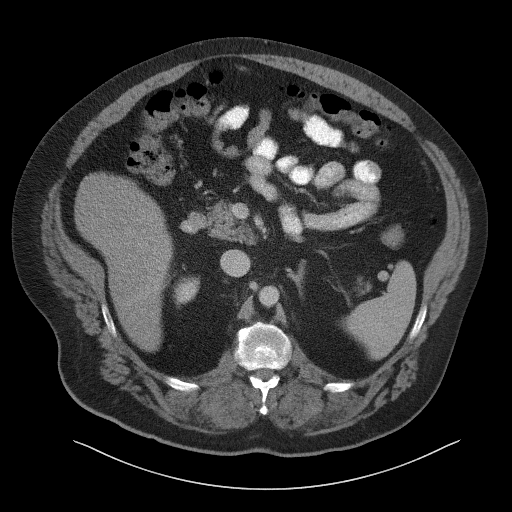
[im 68/105  bone]
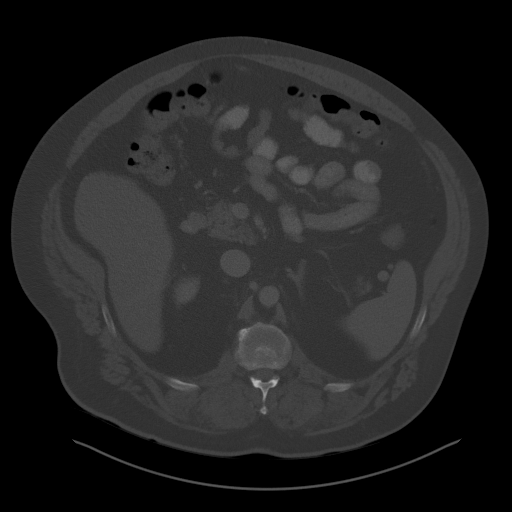
[im 74/105  soft-tissue]
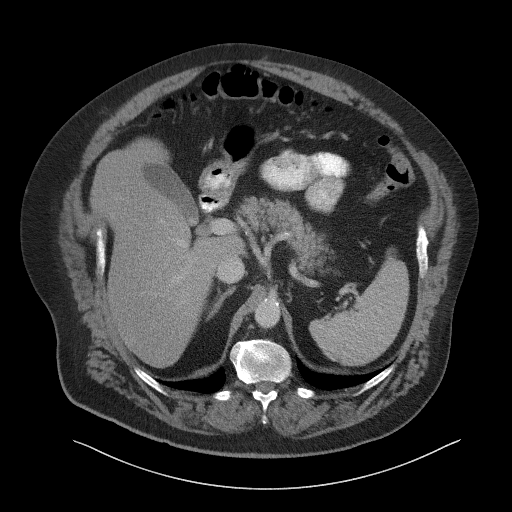
[im 80/105  soft-tissue]
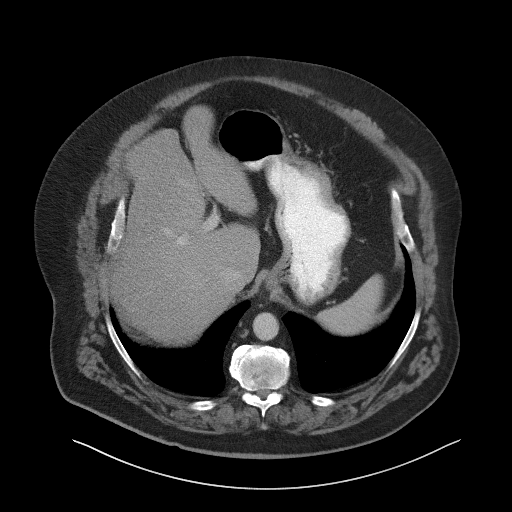
[im 92/105  soft-tissue]
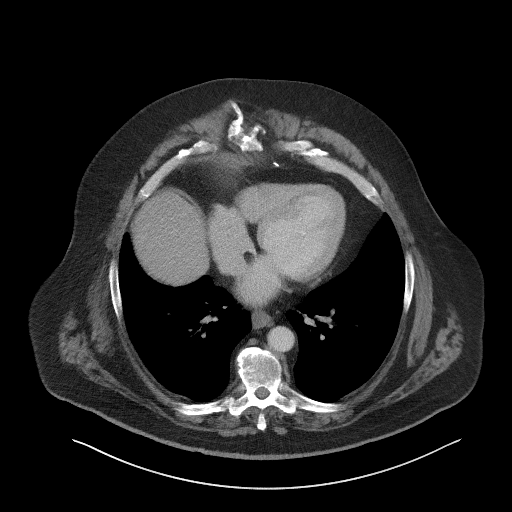
[im 98/105  soft-tissue]
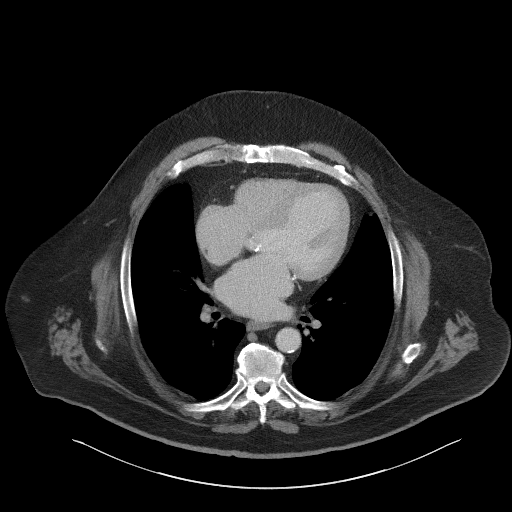

[Series 5: coronal st · coronal · 0.92mm/px · 3 of 130 slices shown]
[im 44/130  soft-tissue]
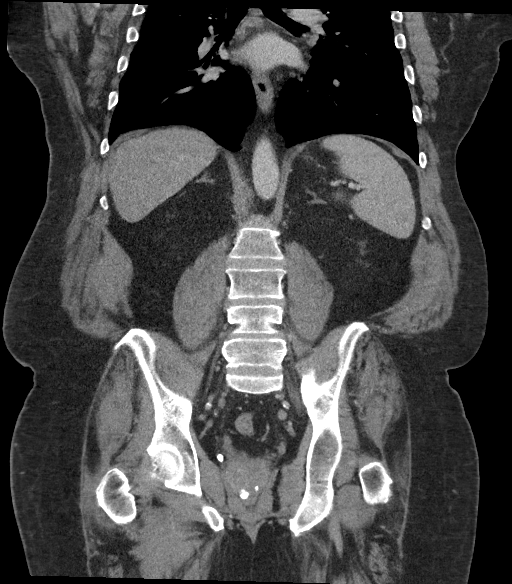
[im 58/130  soft-tissue]
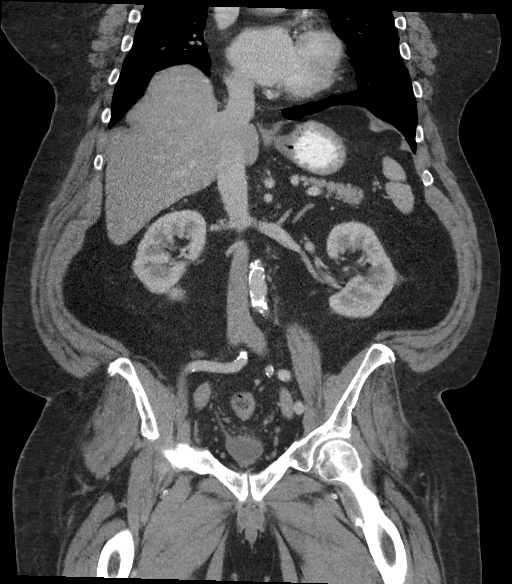
[im 72/130  soft-tissue]
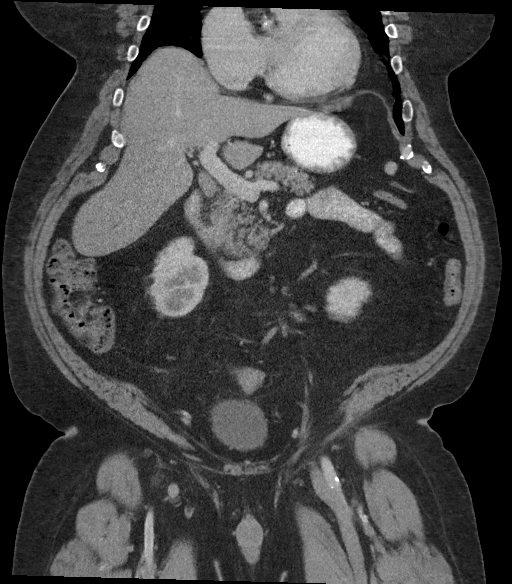

[16 of 46 positions shown; findings below may reference images not displayed]

FINDINGS: Lower chest: There is a nonspecific pulmonary nodule in the left
lower lobe which is new measuring 3 mm, image [DATE].

Hepatobiliary: No focal liver abnormality identified. Stone within
the neck of gallbladder measures 7 mm, image 32/2. No biliary
dilatation.

Pancreas: Unremarkable. No pancreatic ductal dilatation or
surrounding inflammatory changes.

Spleen: Spleen negative.

Adrenals/Urinary Tract: Normal appearance of the adrenal glands.
Small exophytic lesion arising from inferior pole of right kidney is
unchanged measuring 1 cm, image 56/2. Too small to characterize
low-attenuation structure within the inferior pole of left kidney
measures 8 mm. Urinary bladder is unremarkable.

Stomach/Bowel: Stomach is within normal limits. Appendix appears
normal. No evidence of bowel wall thickening, distention, or
inflammatory changes.

Vascular/Lymphatic: Aortic atherosclerosis. No aneurysm. No
abdominal adenopathy. No pelvic or inguinal adenopathy identified.

Reproductive: Calcifications in the prostate gland noted. Symmetric
appearance of the seminal vesicles.

Other: No free fluid or fluid collections identified. There is a
periumbilical hernia which contains fat only.

Musculoskeletal: No suspicious bone lesions
IMPRESSION: 1. No specific findings identified to suggest metastatic disease
from prostate cancer within the abdomen or pelvis.
2. Small bilateral kidney hypodensities are too small to
characterize.
3. Nonspecific pulmonary nodule in the left lower lobe measures 3
mm. New from previous examination.
4. Gallstone
5.  Aortic Atherosclerosis ([RJ]-[RJ]).

## 2017-10-29 IMAGING — NM NM BONE WHOLE BODY
2 series · 10 of 10 positions shown · non-contrast
Comparison: None

CLINICAL DATA: Prostate cancer post therapy

EXAM:
NUCLEAR MEDICINE WHOLE BODY BONE SCAN
TECHNIQUE: Whole body anterior and posterior images were obtained approximately
3 hours after intravenous injection of radiopharmaceutical.
RADIOPHARMACEUTICALS:  22.46 mCi [PN] MDP IV

[Series 1000: 3 hr wholebody · 2.40mm/px · 2 of 2 frames shown]
[frame 1/2]
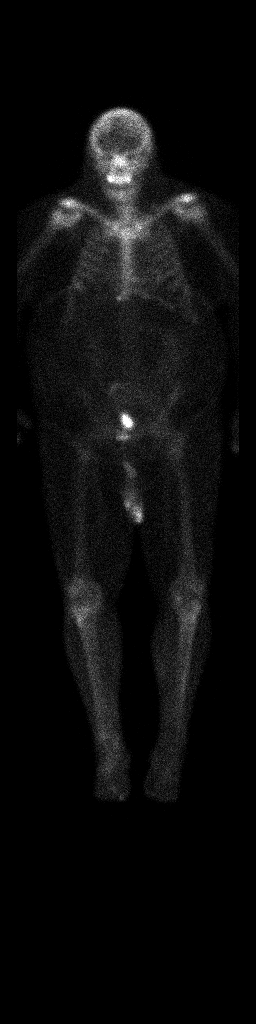
[frame 2/2]
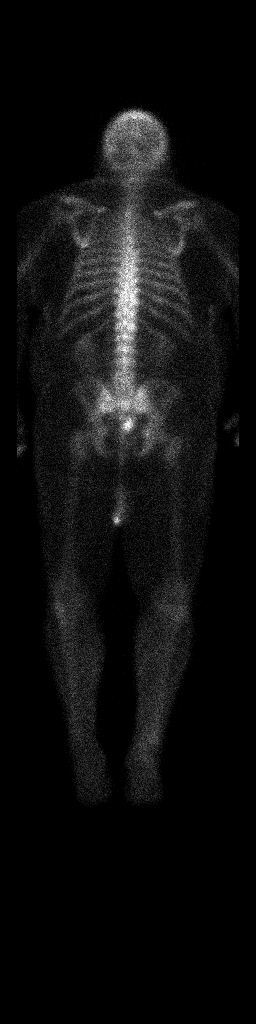

[Series 1000: statics · 2.40mm/px · 4 acquisitions, 8 frames shown]
[im 1/4]
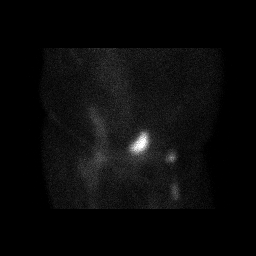
[im 1/4]
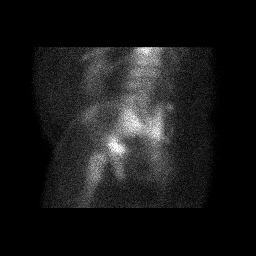
[im 2/4]
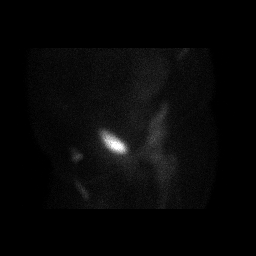
[im 2/4]
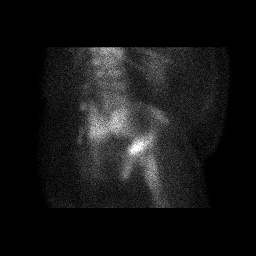
[im 3/4]
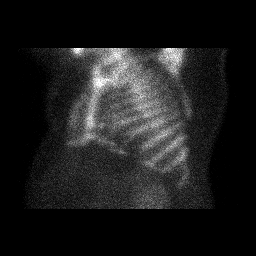
[im 3/4]
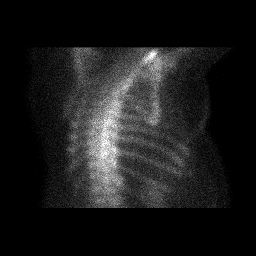
[im 4/4]
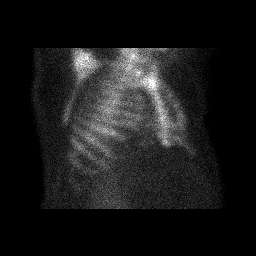
[im 4/4]
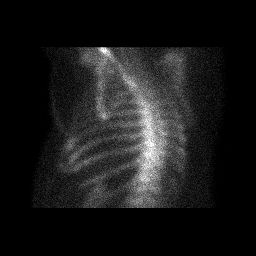

[10 of 10 positions shown; findings below may reference images not displayed]

FINDINGS: Uptake at the shoulders, typically degenerative.

Non-specific maxillary localization of tracer, could be related to
dental or sinus disease, recent dental extractions/dentures.

No definite abnormal sites of osseous tracer accumulation are
identified which are suspicious for osseous metastatic disease.

Expected urinary tract and soft tissue distribution of tracer.
IMPRESSION: No scintigraphic evidence of osseous metastatic disease.

## 2017-10-29 MED ORDER — IOPAMIDOL (ISOVUE-300) INJECTION 61%
100.0000 mL | Freq: Once | INTRAVENOUS | Status: AC | PRN
  Administered 2017-10-29: 100 mL via INTRAVENOUS

## 2017-10-29 MED ORDER — TECHNETIUM TC 99M MEDRONATE IV KIT
22.4600 | PACK | Freq: Once | INTRAVENOUS | Status: AC | PRN
  Administered 2017-10-29: 22.46 via INTRAVENOUS

## 2017-10-31 ENCOUNTER — Telehealth: Payer: Self-pay

## 2017-10-31 ENCOUNTER — Other Ambulatory Visit: Payer: Self-pay | Admitting: Urology

## 2017-10-31 DIAGNOSIS — C61 Malignant neoplasm of prostate: Secondary | ICD-10-CM

## 2017-10-31 NOTE — Telephone Encounter (Signed)
Called and spoke w/ pt regarding results. He states his understanding and will schedule appointment with Radiation Oncology as recommended.

## 2017-11-07 ENCOUNTER — Encounter: Payer: Self-pay | Admitting: Radiation Oncology

## 2017-11-07 ENCOUNTER — Other Ambulatory Visit: Payer: Self-pay

## 2017-11-07 ENCOUNTER — Ambulatory Visit
Admission: RE | Admit: 2017-11-07 | Discharge: 2017-11-07 | Disposition: A | Discharge status: Home / Self Care | Payer: Medicare (Managed Care) | Source: Ambulatory Visit | Attending: Radiation Oncology | Admitting: Radiation Oncology

## 2017-11-07 ENCOUNTER — Telehealth: Payer: Self-pay | Admitting: Urology

## 2017-11-07 VITALS — BP 157/96 | HR 83 | Temp 98.0°F | Resp 18 | Wt 261.2 lb

## 2017-11-07 DIAGNOSIS — I509 Heart failure, unspecified: Secondary | ICD-10-CM | POA: Diagnosis not present

## 2017-11-07 DIAGNOSIS — K219 Gastro-esophageal reflux disease without esophagitis: Secondary | ICD-10-CM | POA: Diagnosis not present

## 2017-11-07 DIAGNOSIS — I252 Old myocardial infarction: Secondary | ICD-10-CM | POA: Insufficient documentation

## 2017-11-07 DIAGNOSIS — I2510 Atherosclerotic heart disease of native coronary artery without angina pectoris: Secondary | ICD-10-CM | POA: Insufficient documentation

## 2017-11-07 DIAGNOSIS — Z8619 Personal history of other infectious and parasitic diseases: Secondary | ICD-10-CM | POA: Diagnosis not present

## 2017-11-07 DIAGNOSIS — Z8719 Personal history of other diseases of the digestive system: Secondary | ICD-10-CM | POA: Insufficient documentation

## 2017-11-07 DIAGNOSIS — F4310 Post-traumatic stress disorder, unspecified: Secondary | ICD-10-CM | POA: Insufficient documentation

## 2017-11-07 DIAGNOSIS — E669 Obesity, unspecified: Secondary | ICD-10-CM | POA: Diagnosis not present

## 2017-11-07 DIAGNOSIS — Z79899 Other long term (current) drug therapy: Secondary | ICD-10-CM | POA: Insufficient documentation

## 2017-11-07 DIAGNOSIS — Z7984 Long term (current) use of oral hypoglycemic drugs: Secondary | ICD-10-CM | POA: Insufficient documentation

## 2017-11-07 DIAGNOSIS — F419 Anxiety disorder, unspecified: Secondary | ICD-10-CM | POA: Insufficient documentation

## 2017-11-07 DIAGNOSIS — E119 Type 2 diabetes mellitus without complications: Secondary | ICD-10-CM | POA: Insufficient documentation

## 2017-11-07 DIAGNOSIS — C61 Malignant neoplasm of prostate: Secondary | ICD-10-CM | POA: Insufficient documentation

## 2017-11-07 DIAGNOSIS — R609 Edema, unspecified: Secondary | ICD-10-CM | POA: Diagnosis not present

## 2017-11-07 DIAGNOSIS — I10 Essential (primary) hypertension: Secondary | ICD-10-CM | POA: Diagnosis not present

## 2017-11-07 DIAGNOSIS — J449 Chronic obstructive pulmonary disease, unspecified: Secondary | ICD-10-CM | POA: Insufficient documentation

## 2017-11-07 DIAGNOSIS — J45909 Unspecified asthma, uncomplicated: Secondary | ICD-10-CM | POA: Insufficient documentation

## 2017-11-07 DIAGNOSIS — G4730 Sleep apnea, unspecified: Secondary | ICD-10-CM | POA: Insufficient documentation

## 2017-11-07 DIAGNOSIS — E785 Hyperlipidemia, unspecified: Secondary | ICD-10-CM | POA: Diagnosis not present

## 2017-11-07 DIAGNOSIS — Z7982 Long term (current) use of aspirin: Secondary | ICD-10-CM | POA: Diagnosis not present

## 2017-11-07 NOTE — Telephone Encounter (Signed)
Dr. Olena Leatherwood office wants to know if you want to manage the Lupron for this patient or if you want him to do it?  Please advise  Sharyn Lull

## 2017-11-07 NOTE — Telephone Encounter (Signed)
-----   Message from Christean Grief, RN sent at 11/07/2017 10:53 AM EDT ----- Regarding: Lupron nj Can you see if Dr. Bernardo Heater wants to manage the Lupron for this patient?  He was referred to Dr. Baruch Gouty by him.  Just let me know.  Thanks Ranelle Oyster, RN

## 2017-11-07 NOTE — Telephone Encounter (Signed)
I am fine if he wants to manage.

## 2017-11-07 NOTE — Consult Note (Signed)
NEW PATIENT EVALUATION  Name: Evan [name]  MRN: [id number]  Date:   11/07/2017     DOB: [birth year]   This 62 y.o. male patient presents to the clinic for initial evaluation of stage IIB (T2 cN0 M0) Gleason 8 (4+4) presenting with a PSA of 30 in patient with multiple comorbidities.  REFERRING PHYSICIAN: Abbie Sons, MD  CHIEF COMPLAINT:  Chief Complaint  Patient presents with  . Prostate Cancer    Initial Evaluation    DIAGNOSIS: The encounter diagnosis was Malignant neoplasm of prostate ([place]).   PREVIOUS INVESTIGATIONS:  Pathology reports reviewed Clinical notes reviewed Bone scan and CT scan reviewed  HPI: patient is a 62 year old male with multiple comorbidities including obesity, coronary artery disease, congestive heart failure, COPD, diabetes mellitus and anxiety disorder who was found to have a PSA close to 30. He was referred to urology and underwent transrectal ultrasound-guided biopsy showing 12 of 12 cores positive for adenocarcinoma several Gleason 8 (4+4) others 4+3 and 3+4.he does have some urinary frequency nocturia 1 no burning.his prostate volume was calculated at 51 cc.based on his multiple medical comorbidities and nomogram showing high chance of extracapsular spread as well as pelvic lymph node involvement he has been referred to radiation oncology for consideration of treatment.patient's had both a bone scan and CT scan showing no evidence of metastatic disease or evidence of extracapsular spread or pelvic lymph node involvement.  PLANNED TREATMENT REGIMEN: I MRT radiation therapy to prostate and pelvic nodes plus ADT therapy  PAST MEDICAL HISTORY:  has a past medical history of Anginal pain ([place]), Anxiety disorder, Asthma, Atresia of esophagus without fistula, CAD (coronary artery disease), Cellulitis, CHF (congestive heart failure) ([place]), COPD (chronic obstructive pulmonary disease) ([place]), Diabetes mellitus without complication ([place]), Edema,  Gastroesophageal reflux, H/O: GI bleed, History of pneumonia, History of scarlet fever, Hyperlipidemia, Hypertension, Myocardial infarction ([place]) (2009), Obesity, Obstructive sleep apnea, Pain, Panic disorder, Peripheral venous insufficiency, PTSD (post-traumatic stress disorder), Retinopathy, Stasis, venous, and Vertigo.    PAST SURGICAL HISTORY:  Past Surgical History:  Procedure Laterality Date  . CARDIAC CATHETERIZATION    . CATARACT EXTRACTION Left   . CATARACT EXTRACTION W/PHACO Right 05/03/2017   Procedure: CATARACT EXTRACTION PHACO AND INTRAOCULAR LENS PLACEMENT (IOC);  Surgeon: Leandrew Koyanagi, MD;  Location: [facility] ORS;  Service: Ophthalmology;  Laterality: Right;  [place] 00:35.3 AP% 12.3 CDE 4.33 Fluid Pack lot # [id number] H       . CORONARY ANGIOPLASTY WITH STENT PLACEMENT  2002  . CORONARY ANGIOPLASTY WITH STENT PLACEMENT  1999  . CORONARY ARTERY BYPASS GRAFT     x7  . ESOPHAGOGASTRODUODENOSCOPY N/A 09/19/2016   Procedure: ESOPHAGOGASTRODUODENOSCOPY (EGD);  Surgeon: Lollie Sails, MD;  Location: [facility] ENDOSCOPY;  Service: Endoscopy;  Laterality: N/A;    FAMILY HISTORY: family history includes Coronary artery disease in his other; Heart attack in his mother; Heart attack (age of onset: 68) in his brother; Hyperlipidemia in his mother; Hypertension in his mother.  SOCIAL HISTORY:  reports that he has never smoked. He has never used smokeless tobacco. He reports that he does not drink alcohol or use drugs.  ALLERGIES: Penicillin g; Sulfa antibiotics; Tiotropium; and Zoloft [sertraline hcl]  MEDICATIONS:  Current Outpatient Medications  Medication Sig Dispense Refill  . aspirin 81 MG EC tablet Take 81 mg by mouth 2 (two) times daily.     . budesonide-formoterol (SYMBICORT) 80-4.5 MCG/ACT inhaler Inhale 2 puffs into the lungs 2 (two) times daily.    . calcium carbonate (OSCAL)  1500 (600 Ca) MG TABS tablet Take 1,500 mg by mouth 2 (two) times daily with a meal.    .  dicyclomine (BENTYL) 10 MG capsule Take 10 mg by mouth 2 (two) times daily. AS NEEDED    . DULoxetine (CYMBALTA) 30 MG capsule Take 30 mg by mouth daily.    Marland Kitchen esomeprazole (NEXIUM) 40 MG capsule Take 40 mg by mouth daily.     . fluticasone (FLONASE) 50 MCG/ACT nasal spray Place 2 sprays into both nostrils daily.     . furosemide (LASIX) 40 MG tablet 40 mg daily. Take 80 mg in the am with 40 mg in the pm.     . hydrOXYzine (ATARAX/VISTARIL) 25 MG tablet Take 25 mg by mouth at bedtime.    Marland Kitchen ibuprofen (ADVIL,MOTRIN) 600 MG tablet Take 600 mg by mouth every 6 (six) hours as needed.    Marland Kitchen ipratropium-albuterol (DUONEB) 0.5-2.5 (3) MG/3ML SOLN Take 3 mLs by nebulization every 4 (four) hours as needed.    Marland Kitchen lisinopril (PRINIVIL,ZESTRIL) 5 MG tablet Take 5 mg by mouth daily.    Marland Kitchen loratadine (CLARITIN) 10 MG tablet Take 10 mg by mouth daily.    . meclizine (ANTIVERT) 25 MG tablet Take 25 mg by mouth.    . metFORMIN (GLUCOPHAGE) 500 MG tablet Take 1 tablet (500 mg total) by mouth 2 (two) times daily with a meal. (Patient taking differently: Take 500 mg by mouth at bedtime. ) 60 tablet 0  . metoprolol succinate (TOPROL XL) 50 MG 24 hr tablet Take by mouth.    . metoprolol tartrate (LOPRESSOR) 25 MG tablet Take 50 mg by mouth daily.     . nitroGLYCERIN (NITROSTAT) 0.4 MG SL tablet Place 0.4 mg under the tongue every 5 (five) minutes as needed for chest pain.    . pantoprazole (PROTONIX) 40 MG tablet Take 40 mg by mouth 2 (two) times daily.     . potassium chloride (K-DUR) 10 MEQ tablet Take 1 tablet (10 mEq total) by mouth daily. 30 tablet 6  . predniSONE (DELTASONE) 20 MG tablet Take 20 mg by mouth 3 (three) times daily as needed.    Marland Kitchen PROAIR HFA 108 (90 BASE) MCG/ACT inhaler Inhale 1 puff into the lungs every 4 (four) hours as needed.     . ranitidine (ZANTAC) 150 MG capsule Take 150 mg by mouth 2 (two) times daily.    . rosuvastatin (CRESTOR) 20 MG tablet Take 20 mg by mouth daily.    . sucralfate  (CARAFATE) 1 g tablet Take 1 g by mouth 2 (two) times daily.     No current facility-administered medications for this encounter.     ECOG PERFORMANCE STATUS:  0 - Asymptomatic  REVIEW OF SYSTEMS:  Patient denies any weight loss, fatigue, weakness, fever, chills or night sweats. Patient denies any loss of vision, blurred vision. Patient denies any ringing  of the ears or hearing loss. No irregular heartbeat. Patient denies heart murmur or history of fainting. Patient denies any chest pain or pain radiating to her upper extremities. Patient denies any shortness of breath, difficulty breathing at night, cough or hemoptysis. Patient denies any swelling in the lower legs. Patient denies any nausea vomiting, vomiting of blood, or coffee ground material in the vomitus. Patient denies any stomach pain. Patient states has had normal bowel movements no significant constipation or diarrhea. Patient denies any dysuria, hematuria or significant nocturia. Patient denies any problems walking, swelling in the joints or loss of balance. Patient denies  any skin changes, loss of hair or loss of weight. Patient denies any excessive worrying or anxiety or significant depression. Patient denies any problems with insomnia. Patient denies excessive thirst, polyuria, polydipsia. Patient denies any swollen glands, patient denies easy bruising or easy bleeding. Patient denies any recent infections, allergies or URI. Patient "s visual fields have not changed significantly in recent time.    PHYSICAL EXAM: BP (!) 157/96   Pulse 83   Temp 98 F (36.7 C)   Resp 18   Wt 261 lb 3.9 oz (118.5 kg)   BMI 43.47 kg/m  Well-developed obese male uses a walker for assistance. On rectal exam rectal sphincter tone is good there is some firm nodularity present in the left lateral lobe sulcus is preserved bilaterally no other rectal abnormalities identified. Well-developed well-nourished patient in NAD. HEENT reveals PERLA, EOMI, discs  not visualized.  Oral cavity is clear. No oral mucosal lesions are identified. Neck is clear without evidence of cervical or supraclavicular adenopathy. Lungs are clear to A&P. Cardiac examination is essentially unremarkable with regular rate and rhythm without murmur rub or thrill. Abdomen is benign with no organomegaly or masses noted. Motor sensory and DTR levels are equal and symmetric in the upper and lower extremities. Cranial nerves II through XII are grossly intact. Proprioception is intact. No peripheral adenopathy or edema is identified. No motor or sensory levels are noted. Crude visual fields are within normal range.  LABORATORY DATA: pathology reports reviewed    RADIOLOGY RESULTS:CT scan of abdomen and pelvis as well as bone scan both reviewed and compatible above-stated findings   IMPRESSION: stage IIB high risk prostate cancer in 62 year old male with multiple medical comorbidities.  PLAN: at this time according to the [facility] he has only a 12% chance of organ confined disease with an 85% chance of extracapsular extension. He also has a 28% chance of lymph node involvement. I also believe based on his multiple medical comorbidities would not be a good risk for general anesthesia. I would recommend I MRT radiation therapy to his prostate up to 8000 cGy and to his pelvic nodes 5400 cGy using I MRT dose painting technique. I would also want him to be suppressed for at least 1.5-2 years with Lupron therapy and have ordered that. Risks and benefits of treatment including increased lower urinary tract symptoms diarrhea fatigue alteration of blood counts skin reaction all were discussed in detail with the patient. I first set up and ordered CT simulation in about a week's time. Patient comprehend my treatment plan well.  I would like to take this opportunity to thank you for allowing me to participate in the care of your patient.Noreene Filbert, MD

## 2017-11-19 ENCOUNTER — Ambulatory Visit
Admission: RE | Admit: 2017-11-19 | Discharge: 2017-11-19 | Disposition: A | Discharge status: Home / Self Care | Payer: Medicare (Managed Care) | Source: Ambulatory Visit | Attending: Radiation Oncology | Admitting: Radiation Oncology

## 2017-11-19 DIAGNOSIS — C61 Malignant neoplasm of prostate: Secondary | ICD-10-CM | POA: Insufficient documentation

## 2017-11-19 DIAGNOSIS — Z510 Encounter for antineoplastic radiation therapy: Secondary | ICD-10-CM | POA: Insufficient documentation

## 2017-11-23 ENCOUNTER — Other Ambulatory Visit: Payer: Self-pay | Admitting: *Deleted

## 2017-11-23 DIAGNOSIS — C61 Malignant neoplasm of prostate: Secondary | ICD-10-CM

## 2017-11-23 DIAGNOSIS — Z510 Encounter for antineoplastic radiation therapy: Secondary | ICD-10-CM | POA: Diagnosis not present

## 2017-11-28 ENCOUNTER — Ambulatory Visit
Admission: RE | Admit: 2017-11-28 | Discharge: 2017-11-28 | Disposition: A | Discharge status: Home / Self Care | Payer: Medicare (Managed Care) | Source: Ambulatory Visit | Attending: Radiation Oncology | Admitting: Radiation Oncology

## 2017-11-29 ENCOUNTER — Ambulatory Visit
Admission: RE | Admit: 2017-11-29 | Discharge: 2017-11-29 | Disposition: A | Discharge status: Home / Self Care | Payer: Medicare (Managed Care) | Source: Ambulatory Visit | Attending: Radiation Oncology | Admitting: Radiation Oncology

## 2017-11-29 DIAGNOSIS — Z510 Encounter for antineoplastic radiation therapy: Secondary | ICD-10-CM | POA: Diagnosis not present

## 2017-11-30 ENCOUNTER — Ambulatory Visit
Admission: RE | Admit: 2017-11-30 | Discharge: 2017-11-30 | Disposition: A | Discharge status: Home / Self Care | Payer: Medicare (Managed Care) | Source: Ambulatory Visit | Attending: Radiation Oncology | Admitting: Radiation Oncology

## 2017-11-30 DIAGNOSIS — Z510 Encounter for antineoplastic radiation therapy: Secondary | ICD-10-CM | POA: Diagnosis not present

## 2017-12-03 ENCOUNTER — Ambulatory Visit
Admission: RE | Admit: 2017-12-03 | Discharge: 2017-12-03 | Disposition: A | Discharge status: Home / Self Care | Payer: Medicare (Managed Care) | Source: Ambulatory Visit | Attending: Radiation Oncology | Admitting: Radiation Oncology

## 2017-12-03 DIAGNOSIS — Z510 Encounter for antineoplastic radiation therapy: Secondary | ICD-10-CM | POA: Diagnosis not present

## 2017-12-04 ENCOUNTER — Ambulatory Visit
Admission: RE | Admit: 2017-12-04 | Discharge: 2017-12-04 | Disposition: A | Discharge status: Home / Self Care | Payer: Medicare (Managed Care) | Source: Ambulatory Visit | Attending: Radiation Oncology | Admitting: Radiation Oncology

## 2017-12-04 DIAGNOSIS — Z510 Encounter for antineoplastic radiation therapy: Secondary | ICD-10-CM | POA: Diagnosis not present

## 2017-12-05 ENCOUNTER — Ambulatory Visit
Admission: RE | Admit: 2017-12-05 | Discharge: 2017-12-05 | Disposition: A | Discharge status: Home / Self Care | Payer: Medicare (Managed Care) | Source: Ambulatory Visit | Attending: Radiation Oncology | Admitting: Radiation Oncology

## 2017-12-05 DIAGNOSIS — Z510 Encounter for antineoplastic radiation therapy: Secondary | ICD-10-CM | POA: Diagnosis present

## 2017-12-05 DIAGNOSIS — C61 Malignant neoplasm of prostate: Secondary | ICD-10-CM | POA: Insufficient documentation

## 2017-12-06 ENCOUNTER — Ambulatory Visit
Admission: RE | Admit: 2017-12-06 | Discharge: 2017-12-06 | Disposition: A | Discharge status: Home / Self Care | Payer: Medicare (Managed Care) | Source: Ambulatory Visit | Attending: Radiation Oncology | Admitting: Radiation Oncology

## 2017-12-06 DIAGNOSIS — Z510 Encounter for antineoplastic radiation therapy: Secondary | ICD-10-CM | POA: Diagnosis not present

## 2017-12-07 ENCOUNTER — Ambulatory Visit
Admission: RE | Admit: 2017-12-07 | Discharge: 2017-12-07 | Disposition: A | Discharge status: Home / Self Care | Payer: Medicare (Managed Care) | Source: Ambulatory Visit | Attending: Radiation Oncology | Admitting: Radiation Oncology

## 2017-12-07 DIAGNOSIS — Z510 Encounter for antineoplastic radiation therapy: Secondary | ICD-10-CM | POA: Diagnosis not present

## 2017-12-10 ENCOUNTER — Ambulatory Visit
Admission: RE | Admit: 2017-12-10 | Discharge: 2017-12-10 | Disposition: A | Discharge status: Home / Self Care | Payer: Medicare (Managed Care) | Source: Ambulatory Visit | Attending: Radiation Oncology | Admitting: Radiation Oncology

## 2017-12-10 DIAGNOSIS — Z510 Encounter for antineoplastic radiation therapy: Secondary | ICD-10-CM | POA: Diagnosis not present

## 2017-12-11 ENCOUNTER — Ambulatory Visit
Admission: RE | Admit: 2017-12-11 | Discharge: 2017-12-11 | Disposition: A | Discharge status: Home / Self Care | Payer: Medicare (Managed Care) | Source: Ambulatory Visit | Attending: Radiation Oncology | Admitting: Radiation Oncology

## 2017-12-11 DIAGNOSIS — Z510 Encounter for antineoplastic radiation therapy: Secondary | ICD-10-CM | POA: Diagnosis not present

## 2017-12-12 ENCOUNTER — Ambulatory Visit
Admission: RE | Admit: 2017-12-12 | Discharge: 2017-12-12 | Disposition: A | Discharge status: Home / Self Care | Payer: Medicare (Managed Care) | Source: Ambulatory Visit | Attending: Radiation Oncology | Admitting: Radiation Oncology

## 2017-12-12 DIAGNOSIS — Z510 Encounter for antineoplastic radiation therapy: Secondary | ICD-10-CM | POA: Diagnosis not present

## 2017-12-13 ENCOUNTER — Other Ambulatory Visit: Payer: Self-pay

## 2017-12-13 ENCOUNTER — Ambulatory Visit
Admission: RE | Admit: 2017-12-13 | Discharge: 2017-12-13 | Disposition: A | Discharge status: Home / Self Care | Payer: Medicare (Managed Care) | Source: Ambulatory Visit | Attending: Radiation Oncology | Admitting: Radiation Oncology

## 2017-12-13 ENCOUNTER — Inpatient Hospital Stay: Payer: Medicare (Managed Care) | Attending: Radiation Oncology

## 2017-12-13 DIAGNOSIS — Z510 Encounter for antineoplastic radiation therapy: Secondary | ICD-10-CM | POA: Diagnosis not present

## 2017-12-13 DIAGNOSIS — C61 Malignant neoplasm of prostate: Secondary | ICD-10-CM

## 2017-12-13 LAB — CBC
HCT: 43.7 % (ref 40.0–52.0)
Hemoglobin: 14.8 g/dL (ref 13.0–18.0)
MCH: 29.1 pg (ref 26.0–34.0)
MCHC: 33.8 g/dL (ref 32.0–36.0)
MCV: 86.2 fL (ref 80.0–100.0)
Platelets: 228 10*3/uL (ref 150–440)
RBC: 5.07 MIL/uL (ref 4.40–5.90)
RDW: 16.3 % — ABNORMAL HIGH (ref 11.5–14.5)
WBC: 7.8 10*3/uL (ref 3.8–10.6)

## 2017-12-14 ENCOUNTER — Ambulatory Visit
Admission: RE | Admit: 2017-12-14 | Discharge: 2017-12-14 | Disposition: A | Discharge status: Home / Self Care | Payer: Medicare (Managed Care) | Source: Ambulatory Visit | Attending: Radiation Oncology | Admitting: Radiation Oncology

## 2017-12-14 DIAGNOSIS — Z510 Encounter for antineoplastic radiation therapy: Secondary | ICD-10-CM | POA: Diagnosis not present

## 2017-12-17 ENCOUNTER — Ambulatory Visit
Admission: RE | Admit: 2017-12-17 | Discharge: 2017-12-17 | Disposition: A | Discharge status: Home / Self Care | Payer: Medicare (Managed Care) | Source: Ambulatory Visit | Attending: Radiation Oncology | Admitting: Radiation Oncology

## 2017-12-17 DIAGNOSIS — Z510 Encounter for antineoplastic radiation therapy: Secondary | ICD-10-CM | POA: Diagnosis not present

## 2017-12-18 ENCOUNTER — Ambulatory Visit
Admission: RE | Admit: 2017-12-18 | Discharge: 2017-12-18 | Disposition: A | Discharge status: Home / Self Care | Payer: Medicare (Managed Care) | Source: Ambulatory Visit | Attending: Radiation Oncology | Admitting: Radiation Oncology

## 2017-12-18 DIAGNOSIS — Z510 Encounter for antineoplastic radiation therapy: Secondary | ICD-10-CM | POA: Diagnosis not present

## 2017-12-19 ENCOUNTER — Ambulatory Visit
Admission: RE | Admit: 2017-12-19 | Discharge: 2017-12-19 | Disposition: A | Discharge status: Home / Self Care | Payer: Medicare (Managed Care) | Source: Ambulatory Visit | Attending: Radiation Oncology | Admitting: Radiation Oncology

## 2017-12-19 DIAGNOSIS — Z510 Encounter for antineoplastic radiation therapy: Secondary | ICD-10-CM | POA: Diagnosis not present

## 2017-12-20 ENCOUNTER — Ambulatory Visit
Admission: RE | Admit: 2017-12-20 | Discharge: 2017-12-20 | Disposition: A | Discharge status: Home / Self Care | Payer: Medicare (Managed Care) | Source: Ambulatory Visit | Attending: Radiation Oncology | Admitting: Radiation Oncology

## 2017-12-20 DIAGNOSIS — Z510 Encounter for antineoplastic radiation therapy: Secondary | ICD-10-CM | POA: Diagnosis not present

## 2017-12-21 ENCOUNTER — Ambulatory Visit: Admission: RE | Admit: 2017-12-21 | Payer: Medicare (Managed Care) | Source: Ambulatory Visit

## 2017-12-24 ENCOUNTER — Ambulatory Visit
Admission: RE | Admit: 2017-12-24 | Discharge: 2017-12-24 | Disposition: A | Discharge status: Home / Self Care | Payer: Medicare (Managed Care) | Source: Ambulatory Visit | Attending: Radiation Oncology | Admitting: Radiation Oncology

## 2017-12-24 DIAGNOSIS — Z510 Encounter for antineoplastic radiation therapy: Secondary | ICD-10-CM | POA: Diagnosis not present

## 2017-12-25 ENCOUNTER — Ambulatory Visit
Admission: RE | Admit: 2017-12-25 | Discharge: 2017-12-25 | Disposition: A | Discharge status: Home / Self Care | Payer: Medicare (Managed Care) | Source: Ambulatory Visit | Attending: Radiation Oncology | Admitting: Radiation Oncology

## 2017-12-25 DIAGNOSIS — Z510 Encounter for antineoplastic radiation therapy: Secondary | ICD-10-CM | POA: Diagnosis not present

## 2017-12-26 ENCOUNTER — Ambulatory Visit
Admission: RE | Admit: 2017-12-26 | Discharge: 2017-12-26 | Disposition: A | Discharge status: Home / Self Care | Payer: Medicare (Managed Care) | Source: Ambulatory Visit | Attending: Radiation Oncology | Admitting: Radiation Oncology

## 2017-12-26 DIAGNOSIS — Z510 Encounter for antineoplastic radiation therapy: Secondary | ICD-10-CM | POA: Diagnosis not present

## 2017-12-27 ENCOUNTER — Ambulatory Visit
Admission: RE | Admit: 2017-12-27 | Discharge: 2017-12-27 | Disposition: A | Discharge status: Home / Self Care | Payer: Medicare (Managed Care) | Source: Ambulatory Visit | Attending: Radiation Oncology | Admitting: Radiation Oncology

## 2017-12-27 ENCOUNTER — Inpatient Hospital Stay: Payer: Medicare (Managed Care)

## 2017-12-27 DIAGNOSIS — C61 Malignant neoplasm of prostate: Secondary | ICD-10-CM | POA: Diagnosis not present

## 2017-12-27 DIAGNOSIS — Z510 Encounter for antineoplastic radiation therapy: Secondary | ICD-10-CM | POA: Diagnosis not present

## 2017-12-27 LAB — CBC
HCT: 42.4 % (ref 40.0–52.0)
Hemoglobin: 14.4 g/dL (ref 13.0–18.0)
MCH: 29.3 pg (ref 26.0–34.0)
MCHC: 34 g/dL (ref 32.0–36.0)
MCV: 86.1 fL (ref 80.0–100.0)
Platelets: 239 10*3/uL (ref 150–440)
RBC: 4.92 MIL/uL (ref 4.40–5.90)
RDW: 16.6 % — ABNORMAL HIGH (ref 11.5–14.5)
WBC: 6.8 10*3/uL (ref 3.8–10.6)

## 2017-12-28 ENCOUNTER — Ambulatory Visit
Admission: RE | Admit: 2017-12-28 | Discharge: 2017-12-28 | Disposition: A | Discharge status: Home / Self Care | Payer: Medicare (Managed Care) | Source: Ambulatory Visit | Attending: Radiation Oncology | Admitting: Radiation Oncology

## 2017-12-28 ENCOUNTER — Ambulatory Visit: Payer: Medicare (Managed Care)

## 2017-12-28 DIAGNOSIS — Z510 Encounter for antineoplastic radiation therapy: Secondary | ICD-10-CM | POA: Diagnosis not present

## 2017-12-31 ENCOUNTER — Emergency Department
Admission: EM | Admit: 2017-12-31 | Discharge: 2017-12-31 | Disposition: A | Discharge status: Home / Self Care | Payer: Medicare (Managed Care) | Attending: Emergency Medicine | Admitting: Emergency Medicine

## 2017-12-31 ENCOUNTER — Encounter: Payer: Self-pay | Admitting: Emergency Medicine

## 2017-12-31 ENCOUNTER — Other Ambulatory Visit: Payer: Self-pay

## 2017-12-31 DIAGNOSIS — E11319 Type 2 diabetes mellitus with unspecified diabetic retinopathy without macular edema: Secondary | ICD-10-CM | POA: Insufficient documentation

## 2017-12-31 DIAGNOSIS — J449 Chronic obstructive pulmonary disease, unspecified: Secondary | ICD-10-CM | POA: Diagnosis not present

## 2017-12-31 DIAGNOSIS — Z7982 Long term (current) use of aspirin: Secondary | ICD-10-CM | POA: Diagnosis not present

## 2017-12-31 DIAGNOSIS — I110 Hypertensive heart disease with heart failure: Secondary | ICD-10-CM | POA: Insufficient documentation

## 2017-12-31 DIAGNOSIS — Z79899 Other long term (current) drug therapy: Secondary | ICD-10-CM | POA: Diagnosis not present

## 2017-12-31 DIAGNOSIS — N3289 Other specified disorders of bladder: Secondary | ICD-10-CM | POA: Diagnosis not present

## 2017-12-31 DIAGNOSIS — I2510 Atherosclerotic heart disease of native coronary artery without angina pectoris: Secondary | ICD-10-CM | POA: Insufficient documentation

## 2017-12-31 DIAGNOSIS — Z7984 Long term (current) use of oral hypoglycemic drugs: Secondary | ICD-10-CM | POA: Diagnosis not present

## 2017-12-31 DIAGNOSIS — I5032 Chronic diastolic (congestive) heart failure: Secondary | ICD-10-CM | POA: Diagnosis not present

## 2017-12-31 DIAGNOSIS — R300 Dysuria: Secondary | ICD-10-CM | POA: Diagnosis present

## 2017-12-31 DIAGNOSIS — Z955 Presence of coronary angioplasty implant and graft: Secondary | ICD-10-CM | POA: Diagnosis not present

## 2017-12-31 DIAGNOSIS — Z951 Presence of aortocoronary bypass graft: Secondary | ICD-10-CM | POA: Diagnosis not present

## 2017-12-31 LAB — URINALYSIS, COMPLETE (UACMP) WITH MICROSCOPIC
Bacteria, UA: NONE SEEN
Bilirubin Urine: NEGATIVE
Glucose, UA: NEGATIVE mg/dL
Ketones, ur: 5 mg/dL — AB
Leukocytes, UA: NEGATIVE
Nitrite: NEGATIVE
Protein, ur: NEGATIVE mg/dL
Specific Gravity, Urine: 1.019 (ref 1.005–1.030)
Squamous Epithelial / LPF: NONE SEEN (ref 0–5)
pH: 5 (ref 5.0–8.0)

## 2017-12-31 LAB — CBC
HCT: 41.5 % (ref 40.0–52.0)
Hemoglobin: 13.9 g/dL (ref 13.0–18.0)
MCH: 28.9 pg (ref 26.0–34.0)
MCHC: 33.4 g/dL (ref 32.0–36.0)
MCV: 86.6 fL (ref 80.0–100.0)
Platelets: 220 10*3/uL (ref 150–440)
RBC: 4.79 MIL/uL (ref 4.40–5.90)
RDW: 16.7 % — ABNORMAL HIGH (ref 11.5–14.5)
WBC: 6.9 10*3/uL (ref 3.8–10.6)

## 2017-12-31 LAB — BASIC METABOLIC PANEL
Anion gap: 8 (ref 5–15)
BUN: 18 mg/dL (ref 6–20)
CO2: 23 mmol/L (ref 22–32)
Calcium: 8.9 mg/dL (ref 8.9–10.3)
Chloride: 105 mmol/L (ref 101–111)
Creatinine, Ser: 0.78 mg/dL (ref 0.61–1.24)
GFR calc Af Amer: >60 mL/min (ref >60)
GFR calc non Af Amer: >60 mL/min (ref >60)
Glucose, Bld: 140 mg/dL — ABNORMAL HIGH (ref 65–99)
Potassium: 3.7 mmol/L (ref 3.5–5.1)
Sodium: 136 mmol/L (ref 135–145)

## 2017-12-31 MED ORDER — PHENAZOPYRIDINE HCL 200 MG PO TABS: 200.0000 mg | ORAL_TABLET | Freq: Three times a day (TID) | ORAL | 0 refills | Status: DC | PRN

## 2017-12-31 MED ORDER — DIAZEPAM 5 MG PO TABS
5.0000 mg | ORAL_TABLET | Freq: Once | ORAL | Status: AC
  Administered 2017-12-31: 5 mg via ORAL

## 2017-12-31 MED ORDER — DIAZEPAM 2 MG PO TABS: 2.0000 mg | ORAL_TABLET | Freq: Three times a day (TID) | ORAL | 0 refills | Status: DC | PRN

## 2017-12-31 MED ORDER — DIAZEPAM 5 MG PO TABS
ORAL_TABLET | ORAL | Status: AC
  Administered 2017-12-31: 5 mg via ORAL
  Filled 2017-12-31: qty 1

## 2017-12-31 NOTE — Discharge Instructions (Signed)
1.  Take Pyridium as prescribed for urinary discomfort. 2.  You may take Valium as needed for bladder spasms. 3.  Return to the ER for worsening symptoms, persistent vomiting, difficulty breathing or other concerns.

## 2017-12-31 NOTE — ED Notes (Signed)
Pt reports last tylenol 650mg  at 2230 for back pain

## 2017-12-31 NOTE — ED Notes (Signed)
Pt with less than 63mL post void residual noted on bladder scan.

## 2017-12-31 NOTE — ED Triage Notes (Signed)
Patient presents to Emergency Department via EMS with complaints of lower back pain and painful urination.  Pt reports recent dx of prostrate CA.    Pt is having visible muscle spasms in lower abdomen

## 2017-12-31 NOTE — ED Provider Notes (Signed)
[facility] Emergency Department Provider Note   ____________________________________________   First MD Initiated Contact with Patient 12/31/17 [phone]     (approximate)  I have reviewed the triage vital signs and the nursing notes.   HISTORY  Chief Complaint Urinary Retention and Back Pain    HPI Evan [name] is a 62 y.o. male brought to the ED from home via EMS with a chief complaint of lower back pain, painful urination and bladder spasms.  Patient reports he was recently diagnosed with prostate cancer and has started radiation.  States tonight he was experiencing dysuria, bladder spasms and low back pain.  Denies associated fever, chills, chest pain, shortness of breath, diarrhea.  Denies recent travel or trauma.   Past Medical History:  Diagnosis Date  . Anginal pain ([place])   . Anxiety disorder   . Asthma   . Atresia of esophagus without fistula   . CAD (coronary artery disease)   . Cellulitis   . CHF (congestive heart failure) ([place])    NYHA CLASS III,CHRONIC,DIASTOLIC  . COPD (chronic obstructive pulmonary disease) ([place])   . Diabetes mellitus without complication ([place])   . Edema    RIGHT LOWER LEG  . Gastroesophageal reflux   . H/O: GI bleed   . History of pneumonia    Remote  . History of scarlet fever    Childhood  . Hyperlipidemia   . Hypertension   . Myocardial infarction ([place]) 2009  . Obesity   . Obstructive sleep apnea   . Pain    CHRONIC BACK / ABDOMINAL  . Panic disorder   . Peripheral venous insufficiency   . PTSD (post-traumatic stress disorder)   . Retinopathy    DIABETIC  . Stasis, venous   . Vertigo     Patient Active Problem List   Diagnosis Date Noted  . Obstructive sleep apnea 04/13/2015  . Diabetes ([employer]) 04/13/2015  . Tooth pain 04/02/2015  . Chest pain, central 04/01/2015  . Cellulitis of right leg 02/17/2015  . Chronic venous insufficiency 09/10/2014  . Chronic diastolic CHF (congestive heart  failure) ([place]) 01/09/2014  . GERD (gastroesophageal reflux disease) 09/12/2013  . Obesity, morbid (more than 100 lbs over ideal weight or BMI > 40) ([place]) 07/26/2011  . OLD MYOCARDIAL INFARCTION 03/02/2010  . HYPERLIPIDEMIA, MIXED 03/20/2009  . HYPERTENSION, BENIGN 03/20/2009  . CORONARY ATHEROSCLEROSIS, ARTERY BYPASS GRAFT 03/20/2009    Past Surgical History:  Procedure Laterality Date  . CARDIAC CATHETERIZATION    . CATARACT EXTRACTION Left   . CATARACT EXTRACTION W/PHACO Right 05/03/2017   Procedure: CATARACT EXTRACTION PHACO AND INTRAOCULAR LENS PLACEMENT (IOC);  Surgeon: Leandrew Koyanagi, MD;  Location: [facility] ORS;  Service: Ophthalmology;  Laterality: Right;  [place] 00:35.3 AP% 12.3 CDE 4.33 Fluid Pack lot # [id number]       . CORONARY ANGIOPLASTY WITH STENT PLACEMENT  2002  . CORONARY ANGIOPLASTY WITH STENT PLACEMENT  1999  . CORONARY ARTERY BYPASS GRAFT     x7  . ESOPHAGOGASTRODUODENOSCOPY N/A 09/19/2016   Procedure: ESOPHAGOGASTRODUODENOSCOPY (EGD);  Surgeon: Lollie Sails, MD;  Location: [facility] ENDOSCOPY;  Service: Endoscopy;  Laterality: N/A;    Prior to Admission medications   Medication Sig Start Date End Date Taking? Authorizing Provider  aspirin 81 MG EC tablet Take 81 mg by mouth 2 (two) times daily.     [provider]  budesonide-formoterol (SYMBICORT) 80-4.5 MCG/ACT inhaler Inhale 2 puffs into the lungs 2 (two) times daily.    [provider]  calcium carbonate (OSCAL) 1500 (600 Ca) MG TABS tablet Take 1,500 mg by mouth 2 (two) times daily with a meal.    [provider]  dicyclomine (BENTYL) 10 MG capsule Take 10 mg by mouth 2 (two) times daily. AS NEEDED    [provider]  DULoxetine (CYMBALTA) 30 MG capsule Take 30 mg by mouth daily.    [provider]  esomeprazole (NEXIUM) 40 MG capsule Take 40 mg by mouth daily.     [provider]  fluticasone (FLONASE) 50 MCG/ACT nasal spray Place 2 sprays into both  nostrils daily.  12/31/13   [provider]  furosemide (LASIX) 40 MG tablet 40 mg daily. Take 80 mg in the am with 40 mg in the pm.     [provider]  hydrOXYzine (ATARAX/VISTARIL) 25 MG tablet Take 25 mg by mouth at bedtime.    [provider]  ibuprofen (ADVIL,MOTRIN) 600 MG tablet Take 600 mg by mouth every 6 (six) hours as needed.    [provider]  ipratropium-albuterol (DUONEB) 0.5-2.5 (3) MG/3ML SOLN Take 3 mLs by nebulization every 4 (four) hours as needed.    [provider]  lisinopril (PRINIVIL,ZESTRIL) 5 MG tablet Take 5 mg by mouth daily.    [provider]  loratadine (CLARITIN) 10 MG tablet Take 10 mg by mouth daily.    [provider]  meclizine (ANTIVERT) 25 MG tablet Take 25 mg by mouth.    [provider]  metFORMIN (GLUCOPHAGE) 500 MG tablet Take 1 tablet (500 mg total) by mouth 2 (two) times daily with a meal. Patient taking differently: Take 500 mg by mouth at bedtime.  02/20/15   Gouru, Illene Silver, MD  metoprolol succinate (TOPROL XL) 50 MG 24 hr tablet Take by mouth. 06/08/15   [provider]  metoprolol tartrate (LOPRESSOR) 25 MG tablet Take 50 mg by mouth daily.     [provider]  nitroGLYCERIN (NITROSTAT) 0.4 MG SL tablet Place 0.4 mg under the tongue every 5 (five) minutes as needed for chest pain.    [provider]  pantoprazole (PROTONIX) 40 MG tablet Take 40 mg by mouth 2 (two) times daily.     [provider]  potassium chloride (K-DUR) 10 MEQ tablet Take 1 tablet (10 mEq total) by mouth daily. 04/14/14   Minna Merritts, MD  predniSONE (DELTASONE) 20 MG tablet Take 20 mg by mouth 3 (three) times daily as needed.    [provider]  PROAIR HFA 108 (90 BASE) MCG/ACT inhaler Inhale 1 puff into the lungs every 4 (four) hours as needed.  12/22/13   [provider]  ranitidine (ZANTAC) 150 MG capsule Take 150 mg by mouth 2 (two) times daily.     [provider]  rosuvastatin (CRESTOR) 20 MG tablet Take 20 mg by mouth daily.    [provider]  sucralfate (CARAFATE) 1 g tablet Take 1 g by mouth 2 (two) times daily.    [provider]    Allergies Penicillin g; Sulfa antibiotics; Tiotropium; and Zoloft [sertraline hcl]  Family History  Problem Relation Age of Onset  . Heart attack Mother   . Hypertension Mother   . Hyperlipidemia Mother   . Heart attack Brother 4       MI  . Coronary artery disease Other     Social History Social History   Tobacco Use  . Smoking status: Never Smoker  . Smokeless tobacco: Never Used  Substance Use Topics  . Alcohol use: No  . Drug use: No    Review of Systems  Constitutional: No fever/chills Eyes: No visual changes. ENT: No sore throat. Cardiovascular: Denies chest pain. Respiratory: Denies shortness of breath. Gastrointestinal: Positive for suprapubic abdominal pain bladder spasms.  No nausea, no vomiting.  No diarrhea.  No constipation. Genitourinary: Positive for dysuria. Musculoskeletal: Positive for back pain. Skin: Negative for rash. Neurological: Negative for headaches, focal weakness or numbness.   ____________________________________________   PHYSICAL EXAM:  VITAL SIGNS: ED Triage Vitals  Enc Vitals Group     BP 12/31/17 0154 (!) 168/96     Pulse Rate 12/31/17 0154 85     Resp 12/31/17 0154 17     Temp 12/31/17 0154 98.2 F (36.8 C)     Temp Source 12/31/17 0154 Oral     SpO2 12/31/17 0154 98 %     Weight 12/31/17 0155 258 lb (117 kg)     Height 12/31/17 0155 5\' 5"  (1.651 m)     Head Circumference --      Peak Flow --      Pain Score 12/31/17 0154 8     Pain Loc --      Pain Edu? --      Excl. in [place]? --    Examined after patient received Valium for bladder spasms: Constitutional: Asleep, awakened for exam.  Alert and oriented. Well appearing and in no acute distress. Eyes: Conjunctivae are normal. PERRL. EOMI. Head:  Atraumatic. Nose: No congestion/rhinnorhea. Mouth/Throat: Mucous membranes are moist.  Oropharynx non-erythematous. Neck: No stridor.   Cardiovascular: Normal rate, regular rhythm. Grossly normal heart sounds.  Good peripheral circulation. Respiratory: Normal respiratory effort.  No retractions. Lungs CTAB. Gastrointestinal: Soft and nontender to light and deep palpation. No distention. No abdominal bruits. No CVA tenderness. Genitourinary: No bladder spasms. Musculoskeletal: No lower extremity tenderness nor edema.  No joint effusions. Neurologic:  Normal speech and language. No gross focal neurologic deficits are appreciated. No gait instability. Skin:  Skin is warm, dry and intact. No rash noted. Psychiatric: Mood and affect are normal. Speech and behavior are normal.  ____________________________________________   LABS (all labs ordered are listed, but only abnormal results are displayed)  Labs Reviewed  URINALYSIS, COMPLETE (UACMP) WITH MICROSCOPIC - Abnormal; Notable for the following components:      Result Value   Color, Urine YELLOW (*)    APPearance CLEAR (*)    Hgb urine dipstick SMALL (*)    Ketones, ur 5 (*)    All other components within normal limits  BASIC METABOLIC PANEL - Abnormal; Notable for the following components:   Glucose, Bld 140 (*)    All other components within normal limits  CBC - Abnormal; Notable for the following components:   RDW 16.7 (*)    All other components within normal limits   ____________________________________________  EKG  None ____________________________________________  RADIOLOGY  ED MD interpretation: None  Official radiology report(s): No results found.  ____________________________________________   PROCEDURES  Procedure(s) performed: None  Procedures  Critical Care performed: No  ____________________________________________   INITIAL IMPRESSION / ASSESSMENT AND PLAN / ED COURSE  As part of my medical  decision making, I reviewed the following data within the [place] notes reviewed and incorporated, Labs reviewed, Old chart reviewed and Notes from prior ED visits   63 year old male who presents with bladder spasms, dysuria and low back pain.  Differential diagnosis includes but is not limited  to acute urinary retention, UTI, bladder spasms, etc.  Laboratory and urinalysis results unremarkable.  Patient had significant improvement after oral Valium.  Postvoid residual only 10 cc.  Will discharge home with prescription for Valium, Pyridium and he will follow-up with his PCP this week.  Strict return precautions given.  Patient verbalizes understanding agrees with plan of care.      ____________________________________________   FINAL CLINICAL IMPRESSION(S) / ED DIAGNOSES  Final diagnoses:  Dysuria  Bladder spasms     ED Discharge Orders    None       Note:  This document was prepared using Dragon voice recognition software and may include unintentional dictation errors.     Paulette Blanch, MD 12/31/17 [phone]

## 2018-01-01 ENCOUNTER — Ambulatory Visit
Admission: RE | Admit: 2018-01-01 | Discharge: 2018-01-01 | Disposition: A | Discharge status: Home / Self Care | Payer: Medicare (Managed Care) | Source: Ambulatory Visit | Attending: Radiation Oncology | Admitting: Radiation Oncology

## 2018-01-01 DIAGNOSIS — Z510 Encounter for antineoplastic radiation therapy: Secondary | ICD-10-CM | POA: Diagnosis not present

## 2018-01-02 ENCOUNTER — Ambulatory Visit
Admission: RE | Admit: 2018-01-02 | Discharge: 2018-01-02 | Disposition: A | Discharge status: Home / Self Care | Payer: Medicare (Managed Care) | Source: Ambulatory Visit | Attending: Radiation Oncology | Admitting: Radiation Oncology

## 2018-01-02 DIAGNOSIS — Z510 Encounter for antineoplastic radiation therapy: Secondary | ICD-10-CM | POA: Diagnosis not present

## 2018-01-03 ENCOUNTER — Ambulatory Visit
Admission: RE | Admit: 2018-01-03 | Discharge: 2018-01-03 | Disposition: A | Discharge status: Home / Self Care | Payer: Medicare (Managed Care) | Source: Ambulatory Visit | Attending: Radiation Oncology | Admitting: Radiation Oncology

## 2018-01-03 DIAGNOSIS — Z510 Encounter for antineoplastic radiation therapy: Secondary | ICD-10-CM | POA: Diagnosis not present

## 2018-01-04 ENCOUNTER — Ambulatory Visit
Admission: RE | Admit: 2018-01-04 | Discharge: 2018-01-04 | Disposition: A | Discharge status: Home / Self Care | Payer: Medicare (Managed Care) | Source: Ambulatory Visit | Attending: Radiation Oncology | Admitting: Radiation Oncology

## 2018-01-04 DIAGNOSIS — Z510 Encounter for antineoplastic radiation therapy: Secondary | ICD-10-CM | POA: Diagnosis not present

## 2018-01-07 ENCOUNTER — Ambulatory Visit
Admission: RE | Admit: 2018-01-07 | Discharge: 2018-01-07 | Disposition: A | Discharge status: Home / Self Care | Payer: Medicare (Managed Care) | Source: Ambulatory Visit | Attending: Radiation Oncology | Admitting: Radiation Oncology

## 2018-01-07 DIAGNOSIS — Z510 Encounter for antineoplastic radiation therapy: Secondary | ICD-10-CM | POA: Diagnosis present

## 2018-01-07 DIAGNOSIS — C61 Malignant neoplasm of prostate: Secondary | ICD-10-CM | POA: Insufficient documentation

## 2018-01-08 ENCOUNTER — Ambulatory Visit
Admission: RE | Admit: 2018-01-08 | Discharge: 2018-01-08 | Disposition: A | Discharge status: Home / Self Care | Payer: Medicare (Managed Care) | Source: Ambulatory Visit | Attending: Radiation Oncology | Admitting: Radiation Oncology

## 2018-01-08 DIAGNOSIS — Z510 Encounter for antineoplastic radiation therapy: Secondary | ICD-10-CM | POA: Diagnosis not present

## 2018-01-09 ENCOUNTER — Ambulatory Visit
Admission: RE | Admit: 2018-01-09 | Discharge: 2018-01-09 | Disposition: A | Discharge status: Home / Self Care | Payer: Medicare (Managed Care) | Source: Ambulatory Visit | Attending: Radiation Oncology | Admitting: Radiation Oncology

## 2018-01-09 DIAGNOSIS — Z510 Encounter for antineoplastic radiation therapy: Secondary | ICD-10-CM | POA: Diagnosis not present

## 2018-01-10 ENCOUNTER — Other Ambulatory Visit: Payer: Self-pay

## 2018-01-10 ENCOUNTER — Inpatient Hospital Stay: Payer: Medicare (Managed Care) | Attending: Radiation Oncology

## 2018-01-10 ENCOUNTER — Ambulatory Visit
Admission: RE | Admit: 2018-01-10 | Discharge: 2018-01-10 | Disposition: A | Discharge status: Home / Self Care | Payer: Medicare (Managed Care) | Source: Ambulatory Visit | Attending: Radiation Oncology | Admitting: Radiation Oncology

## 2018-01-10 DIAGNOSIS — C61 Malignant neoplasm of prostate: Secondary | ICD-10-CM | POA: Insufficient documentation

## 2018-01-10 DIAGNOSIS — Z510 Encounter for antineoplastic radiation therapy: Secondary | ICD-10-CM | POA: Diagnosis not present

## 2018-01-10 LAB — CBC
HCT: 40.5 % (ref 40.0–52.0)
Hemoglobin: 13.6 g/dL (ref 13.0–18.0)
MCH: 29.2 pg (ref 26.0–34.0)
MCHC: 33.7 g/dL (ref 32.0–36.0)
MCV: 86.7 fL (ref 80.0–100.0)
Platelets: 253 10*3/uL (ref 150–440)
RBC: 4.67 MIL/uL (ref 4.40–5.90)
RDW: 16.4 % — ABNORMAL HIGH (ref 11.5–14.5)
WBC: 6.1 10*3/uL (ref 3.8–10.6)

## 2018-01-11 ENCOUNTER — Ambulatory Visit
Admission: RE | Admit: 2018-01-11 | Discharge: 2018-01-11 | Disposition: A | Discharge status: Home / Self Care | Payer: Medicare (Managed Care) | Source: Ambulatory Visit | Attending: Radiation Oncology | Admitting: Radiation Oncology

## 2018-01-11 DIAGNOSIS — Z510 Encounter for antineoplastic radiation therapy: Secondary | ICD-10-CM | POA: Diagnosis not present

## 2018-01-14 ENCOUNTER — Ambulatory Visit
Admission: RE | Admit: 2018-01-14 | Discharge: 2018-01-14 | Disposition: A | Discharge status: Home / Self Care | Payer: Medicare (Managed Care) | Source: Ambulatory Visit | Attending: Radiation Oncology | Admitting: Radiation Oncology

## 2018-01-14 DIAGNOSIS — Z510 Encounter for antineoplastic radiation therapy: Secondary | ICD-10-CM | POA: Diagnosis not present

## 2018-01-15 ENCOUNTER — Ambulatory Visit
Admission: RE | Admit: 2018-01-15 | Discharge: 2018-01-15 | Disposition: A | Discharge status: Home / Self Care | Payer: Medicare (Managed Care) | Source: Ambulatory Visit | Attending: Radiation Oncology | Admitting: Radiation Oncology

## 2018-01-15 DIAGNOSIS — Z510 Encounter for antineoplastic radiation therapy: Secondary | ICD-10-CM | POA: Diagnosis not present

## 2018-01-16 ENCOUNTER — Ambulatory Visit
Admission: RE | Admit: 2018-01-16 | Discharge: 2018-01-16 | Disposition: A | Discharge status: Home / Self Care | Payer: Medicare (Managed Care) | Source: Ambulatory Visit | Attending: Radiation Oncology | Admitting: Radiation Oncology

## 2018-01-16 DIAGNOSIS — Z510 Encounter for antineoplastic radiation therapy: Secondary | ICD-10-CM | POA: Diagnosis not present

## 2018-01-17 ENCOUNTER — Ambulatory Visit
Admission: RE | Admit: 2018-01-17 | Discharge: 2018-01-17 | Disposition: A | Discharge status: Home / Self Care | Payer: Medicare (Managed Care) | Source: Ambulatory Visit | Attending: Radiation Oncology | Admitting: Radiation Oncology

## 2018-01-17 DIAGNOSIS — Z510 Encounter for antineoplastic radiation therapy: Secondary | ICD-10-CM | POA: Diagnosis not present

## 2018-01-18 ENCOUNTER — Ambulatory Visit
Admission: RE | Admit: 2018-01-18 | Discharge: 2018-01-18 | Disposition: A | Discharge status: Home / Self Care | Payer: Medicare (Managed Care) | Source: Ambulatory Visit | Attending: Radiation Oncology | Admitting: Radiation Oncology

## 2018-01-18 DIAGNOSIS — Z510 Encounter for antineoplastic radiation therapy: Secondary | ICD-10-CM | POA: Diagnosis not present

## 2018-01-21 ENCOUNTER — Ambulatory Visit
Admission: RE | Admit: 2018-01-21 | Discharge: 2018-01-21 | Disposition: A | Discharge status: Home / Self Care | Payer: Medicare (Managed Care) | Source: Ambulatory Visit | Attending: Radiation Oncology | Admitting: Radiation Oncology

## 2018-01-21 DIAGNOSIS — Z510 Encounter for antineoplastic radiation therapy: Secondary | ICD-10-CM | POA: Diagnosis not present

## 2018-01-22 ENCOUNTER — Ambulatory Visit
Admission: RE | Admit: 2018-01-22 | Discharge: 2018-01-22 | Disposition: A | Discharge status: Home / Self Care | Payer: Medicare (Managed Care) | Source: Ambulatory Visit | Attending: Radiation Oncology | Admitting: Radiation Oncology

## 2018-01-22 DIAGNOSIS — Z510 Encounter for antineoplastic radiation therapy: Secondary | ICD-10-CM | POA: Diagnosis not present

## 2018-01-23 ENCOUNTER — Ambulatory Visit
Admission: RE | Admit: 2018-01-23 | Discharge: 2018-01-23 | Disposition: A | Discharge status: Home / Self Care | Payer: Medicare (Managed Care) | Source: Ambulatory Visit | Attending: Radiation Oncology | Admitting: Radiation Oncology

## 2018-01-23 DIAGNOSIS — Z510 Encounter for antineoplastic radiation therapy: Secondary | ICD-10-CM | POA: Diagnosis not present

## 2018-01-24 ENCOUNTER — Ambulatory Visit: Payer: Medicare (Managed Care)

## 2018-01-24 ENCOUNTER — Ambulatory Visit
Admission: RE | Admit: 2018-01-24 | Discharge: 2018-01-24 | Disposition: A | Discharge status: Home / Self Care | Payer: Medicare (Managed Care) | Source: Ambulatory Visit | Attending: Radiation Oncology | Admitting: Radiation Oncology

## 2018-01-24 DIAGNOSIS — Z510 Encounter for antineoplastic radiation therapy: Secondary | ICD-10-CM | POA: Diagnosis not present

## 2018-01-25 ENCOUNTER — Ambulatory Visit
Admission: RE | Admit: 2018-01-25 | Discharge: 2018-01-25 | Disposition: A | Discharge status: Home / Self Care | Payer: Medicare (Managed Care) | Source: Ambulatory Visit | Attending: Radiation Oncology | Admitting: Radiation Oncology

## 2018-01-25 DIAGNOSIS — Z510 Encounter for antineoplastic radiation therapy: Secondary | ICD-10-CM | POA: Diagnosis not present

## 2018-03-01 ENCOUNTER — Other Ambulatory Visit: Payer: Self-pay

## 2018-03-01 ENCOUNTER — Ambulatory Visit
Admission: RE | Admit: 2018-03-01 | Discharge: 2018-03-01 | Disposition: A | Discharge status: Home / Self Care | Payer: Medicare (Managed Care) | Source: Ambulatory Visit | Attending: Radiation Oncology | Admitting: Radiation Oncology

## 2018-03-01 ENCOUNTER — Encounter: Payer: Self-pay | Admitting: Radiation Oncology

## 2018-03-01 VITALS — BP 146/89 | HR 84 | Temp 98.8°F | Resp 20 | Wt 248.3 lb

## 2018-03-01 DIAGNOSIS — Z923 Personal history of irradiation: Secondary | ICD-10-CM | POA: Insufficient documentation

## 2018-03-01 DIAGNOSIS — C61 Malignant neoplasm of prostate: Secondary | ICD-10-CM | POA: Diagnosis present

## 2018-03-01 NOTE — Progress Notes (Signed)
Radiation Oncology Follow up Note  Name: Evan [name]   Date:   03/01/2018 MRN:  [id number] DOB: 17-Jul-1956    This 62 y.o. male presents to the clinic today for one-month follow-up status post IM RT radiation therapy to his prostate and pelvic nodes for a Gleason 8 (4+4).presenting with a PSA of 30.  REFERRING PROVIDER: Leticia Penna, MD  HPI: patient is a 62 year old male now out 1 month having completed IM RT radiation therapy to his prostate and pelvic nodes for Gleason 8 adenocarcinoma the prostate. Seen today in routine follow-up he is doing well he states he is not having any diarrhea or any increased lower urinary tract symptoms. He has nocturia 0. He is on androgen deprivation therapy being given at his [facility].  COMPLICATIONS OF TREATMENT: none  FOLLOW UP COMPLIANCE: keeps appointments   PHYSICAL EXAM:  BP (!) 146/89   Pulse 84   Temp 98.8 F (37.1 C)   Resp 20   Wt 248 lb 5.6 oz (112.6 kg)   BMI 41.33 kg/m  Well-developed well-nourished patient in NAD. HEENT reveals PERLA, EOMI, discs not visualized.  Oral cavity is clear. No oral mucosal lesions are identified. Neck is clear without evidence of cervical or supraclavicular adenopathy. Lungs are clear to A&P. Cardiac examination is essentially unremarkable with regular rate and rhythm without murmur rub or thrill. Abdomen is benign with no organomegaly or masses noted. Motor sensory and DTR levels are equal and symmetric in the upper and lower extremities. Cranial nerves II through XII are grossly intact. Proprioception is intact. No peripheral adenopathy or edema is identified. No motor or sensory levels are noted. Crude visual fields are within normal range.  RADIOLOGY RESULTS: no current films for review  PLAN: present time patient is doing well recovering nicely with low side effect profile from his I MRT radiation therapy. I'm please was overall progress. He continues onandrogen deprivation therapy and we will  make sure that is being provided. I've also asked to see him back in 3 months for follow-up at which time we will obtain a PSA. Patient is to call sooner with any concerns.  I would like to take this opportunity to thank you for allowing me to participate in the care of your patient.Noreene Filbert, MD

## 2018-05-24 ENCOUNTER — Other Ambulatory Visit: Payer: Self-pay | Admitting: *Deleted

## 2018-05-24 DIAGNOSIS — C61 Malignant neoplasm of prostate: Secondary | ICD-10-CM

## 2018-05-27 ENCOUNTER — Other Ambulatory Visit: Payer: Self-pay

## 2018-05-27 ENCOUNTER — Inpatient Hospital Stay: Payer: Medicare (Managed Care) | Attending: Radiation Oncology

## 2018-05-27 DIAGNOSIS — C61 Malignant neoplasm of prostate: Secondary | ICD-10-CM | POA: Diagnosis present

## 2018-05-27 LAB — PSA: Prostatic Specific Antigen: 0.97 ng/mL (ref 0.00–4.00)

## 2018-06-03 ENCOUNTER — Encounter: Payer: Self-pay | Admitting: Radiation Oncology

## 2018-06-03 ENCOUNTER — Other Ambulatory Visit: Payer: Self-pay

## 2018-06-03 ENCOUNTER — Ambulatory Visit
Admission: RE | Admit: 2018-06-03 | Discharge: 2018-06-03 | Disposition: A | Discharge status: Home / Self Care | Payer: Medicare (Managed Care) | Source: Ambulatory Visit | Attending: Radiation Oncology | Admitting: Radiation Oncology

## 2018-06-03 VITALS — Temp 96.4°F | Resp 16 | Wt 247.2 lb

## 2018-06-03 DIAGNOSIS — Z923 Personal history of irradiation: Secondary | ICD-10-CM | POA: Diagnosis not present

## 2018-06-03 DIAGNOSIS — C61 Malignant neoplasm of prostate: Secondary | ICD-10-CM | POA: Diagnosis not present

## 2018-06-03 NOTE — Progress Notes (Signed)
Radiation Oncology Follow up Note  Name: Evan [name]   Date:   06/03/2018 MRN:  [id number] DOB: March 16, 1956    This 62 y.o. male presents to the clinic today for 77-month follow-up status post IMRT radiation therapy to his prostate and pelvic nodes were Gleason 8 (4+4) presenting with a PSA of 30.  REFERRING PROVIDER: No ref. provider found  HPI: Patient is a 62 year old male now out 4 months having completed IMRT radiation therapy to his prostate and pelvic nodes were Gleason 8 (4+4) presenting with a PSA of 30 seen today in routine follow-up he is doing well.  He specifically denies diarrhea dysuria or any other GI/GU complaints.  His most recent PSA was 0.9.  COMPLICATIONS OF TREATMENT: none  FOLLOW UP COMPLIANCE: keeps appointments   PHYSICAL EXAM:  BP (!) (P) 154/93 (BP Location: Left Arm, Patient Position: Sitting)   Pulse (P) 90   Temp (!) 96.4 F (35.8 C) (Tympanic)   Resp 16   Wt 247 lb 3.9 oz (112.1 kg)   BMI 41.14 kg/m  Well-developed well-nourished patient in NAD. HEENT reveals PERLA, EOMI, discs not visualized.  Oral cavity is clear. No oral mucosal lesions are identified. Neck is clear without evidence of cervical or supraclavicular adenopathy. Lungs are clear to A&P. Cardiac examination is essentially unremarkable with regular rate and rhythm without murmur rub or thrill. Abdomen is benign with no organomegaly or masses noted. Motor sensory and DTR levels are equal and symmetric in the upper and lower extremities. Cranial nerves II through XII are grossly intact. Proprioception is intact. No peripheral adenopathy or edema is identified. No motor or sensory levels are noted. Crude visual fields are within normal range.  RADIOLOGY RESULTS: No current films for review  PLAN: Present time patient is doing well with excellent response with PSA below 1.  I have asked to see him back in 6 months for follow-up with a PSA prior to that visit.  We will check with Dr. Cherrie Gauze  office for continuation of ADT therapy.  Patient is to call at anytime with any concerns.  I would like to take this opportunity to thank you for allowing me to participate in the care of your patient.Noreene Filbert, MD

## 2018-07-03 ENCOUNTER — Ambulatory Visit: Payer: Medicare (Managed Care) | Admitting: Cardiovascular Disease

## 2018-08-08 ENCOUNTER — Other Ambulatory Visit: Payer: Self-pay

## 2018-08-08 ENCOUNTER — Emergency Department
Admission: EM | Admit: 2018-08-08 | Discharge: 2018-08-08 | Disposition: A | Discharge status: Home / Self Care | Payer: Medicare (Managed Care) | Attending: Emergency Medicine | Admitting: Emergency Medicine

## 2018-08-08 ENCOUNTER — Emergency Department: Payer: Medicare (Managed Care)

## 2018-08-08 DIAGNOSIS — Z79899 Other long term (current) drug therapy: Secondary | ICD-10-CM | POA: Diagnosis not present

## 2018-08-08 DIAGNOSIS — M79605 Pain in left leg: Secondary | ICD-10-CM | POA: Insufficient documentation

## 2018-08-08 DIAGNOSIS — I10 Essential (primary) hypertension: Secondary | ICD-10-CM | POA: Insufficient documentation

## 2018-08-08 DIAGNOSIS — Z7982 Long term (current) use of aspirin: Secondary | ICD-10-CM | POA: Diagnosis not present

## 2018-08-08 LAB — CBC WITH DIFFERENTIAL/PLATELET
Abs Immature Granulocytes: 0.03 10*3/uL (ref 0.00–0.07)
Basophils Absolute: 0.1 10*3/uL (ref 0.0–0.1)
Basophils Relative: 1 %
Eosinophils Absolute: 0.3 10*3/uL (ref 0.0–0.5)
Eosinophils Relative: 3 %
HCT: 41.4 % (ref 39.0–52.0)
Hemoglobin: 13.1 g/dL (ref 13.0–17.0)
Immature Granulocytes: 0 %
Lymphocytes Relative: 15 %
Lymphs Abs: 1.4 10*3/uL (ref 0.7–4.0)
MCH: 27.6 pg (ref 26.0–34.0)
MCHC: 31.6 g/dL (ref 30.0–36.0)
MCV: 87.2 fL (ref 80.0–100.0)
Monocytes Absolute: 0.8 10*3/uL (ref 0.1–1.0)
Monocytes Relative: 9 %
Neutro Abs: 6.3 10*3/uL (ref 1.7–7.7)
Neutrophils Relative %: 72 %
Platelets: 288 10*3/uL (ref 150–400)
RBC: 4.75 MIL/uL (ref 4.22–5.81)
RDW: 15 % (ref 11.5–15.5)
WBC: 8.8 10*3/uL (ref 4.0–10.5)
nRBC: 0 % (ref 0.0–0.2)

## 2018-08-08 LAB — COMPREHENSIVE METABOLIC PANEL
ALT: 18 U/L (ref 0–44)
AST: 19 U/L (ref 15–41)
Albumin: 3.7 g/dL (ref 3.5–5.0)
Alkaline Phosphatase: 87 U/L (ref 38–126)
Anion gap: 10 (ref 5–15)
BUN: 17 mg/dL (ref 8–23)
CO2: 23 mmol/L (ref 22–32)
Calcium: 9.1 mg/dL (ref 8.9–10.3)
Chloride: 104 mmol/L (ref 98–111)
Creatinine, Ser: 0.93 mg/dL (ref 0.61–1.24)
GFR calc Af Amer: >60 mL/min (ref >60)
GFR calc non Af Amer: >60 mL/min (ref >60)
Glucose, Bld: 150 mg/dL — ABNORMAL HIGH (ref 70–99)
Potassium: 3.5 mmol/L (ref 3.5–5.1)
Sodium: 137 mmol/L (ref 135–145)
Total Bilirubin: 0.7 mg/dL (ref 0.3–1.2)
Total Protein: 7.2 g/dL (ref 6.5–8.1)

## 2018-08-08 LAB — CK: Total CK: 111 U/L (ref 49–397)

## 2018-08-08 IMAGING — US US EXTREM LOW VENOUS*L*
1 series · 13 of 24 positions shown · non-contrast
Comparison: None.

CLINICAL DATA: Left lower extremity pain and edema for the past 2
weeks. History of prostate cancer. Evaluate for DVT.



[Series 1: us extrem low venous*left* · 13 of 33 slices shown]
[im 1/33]
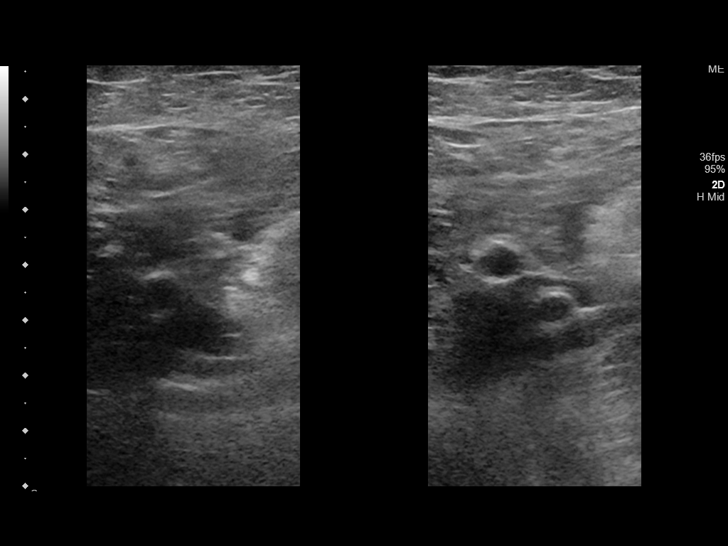
[im 3/33]
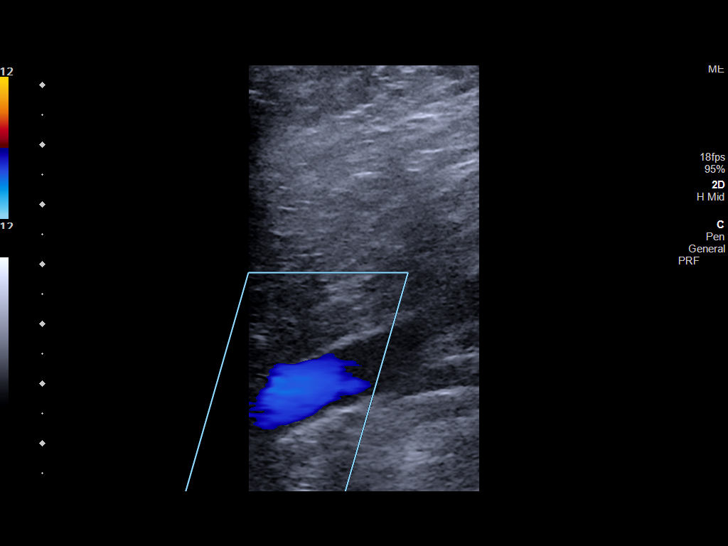
[im 6/33]
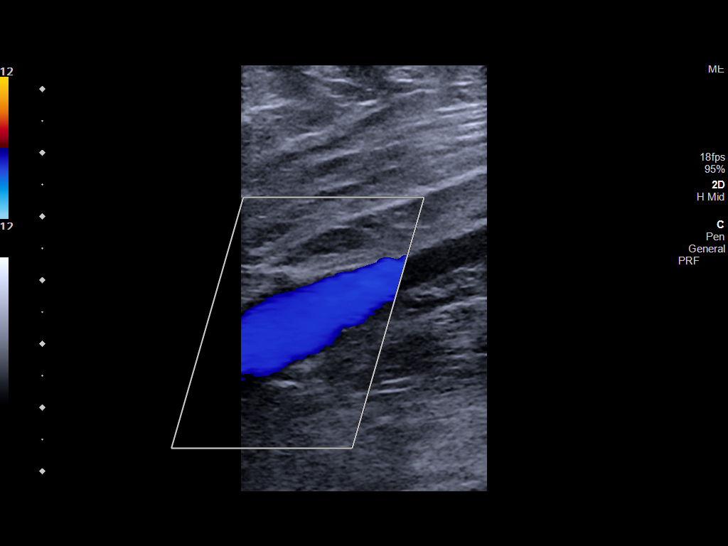
[im 9/33]
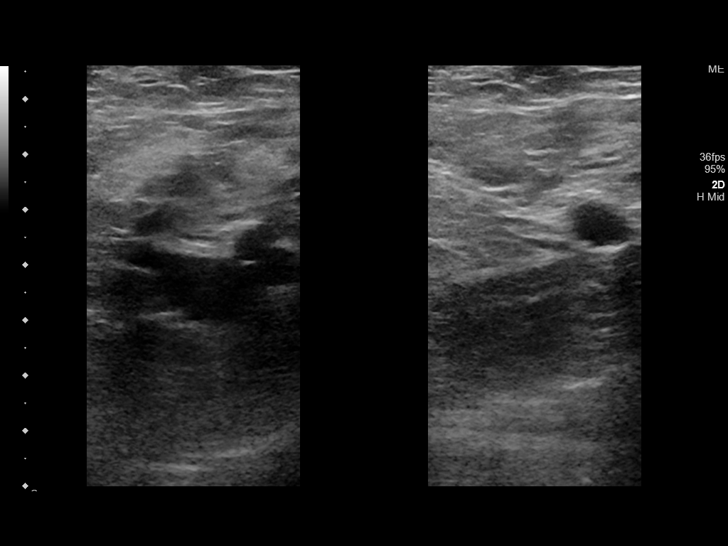
[im 12/33]
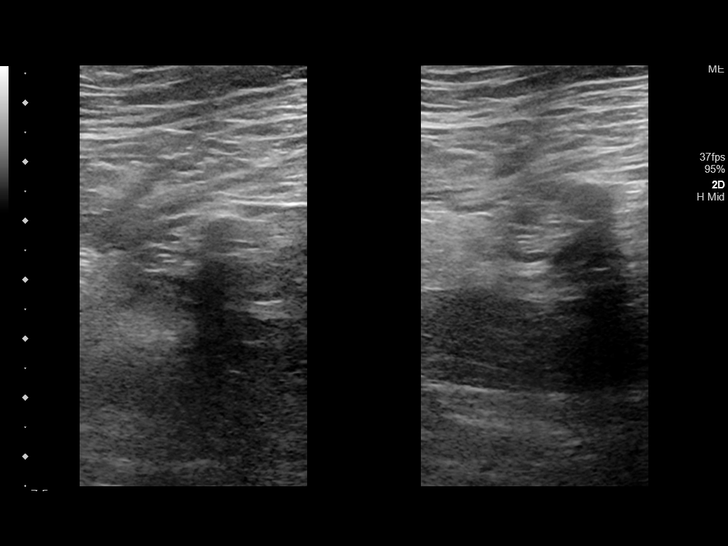
[im 14/33]
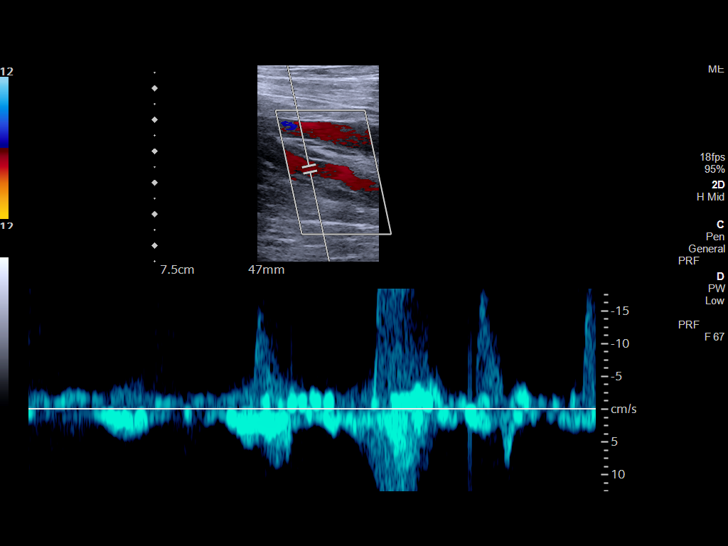
[im 17/33]
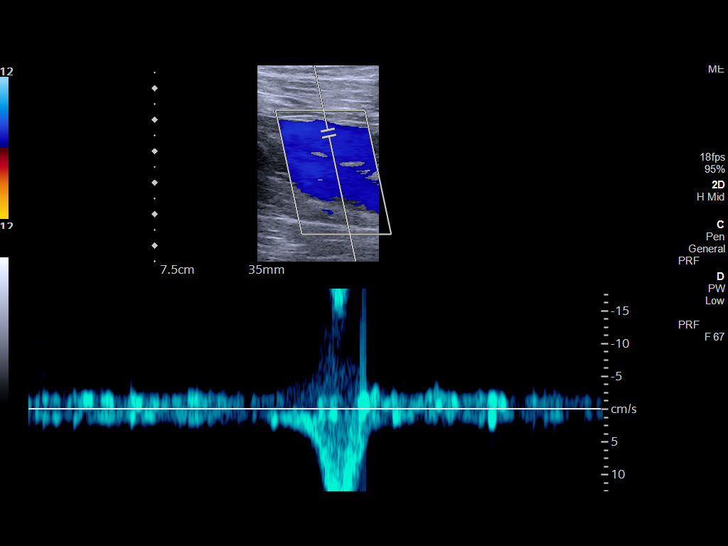
[im 19/33]
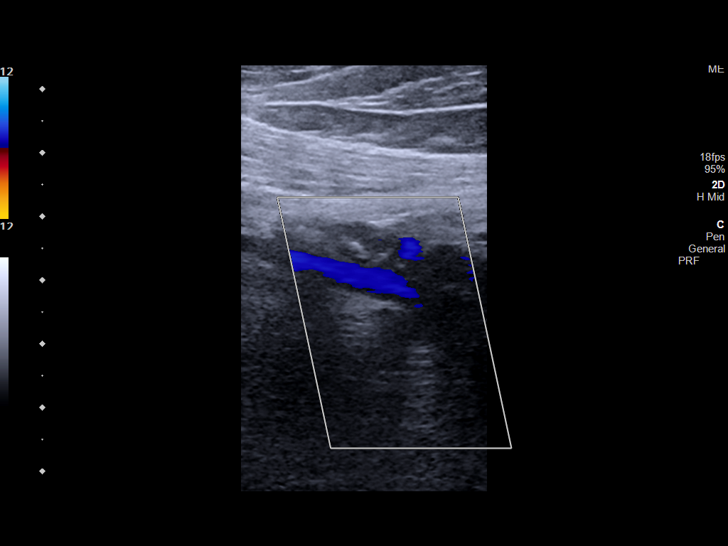
[im 21/33]
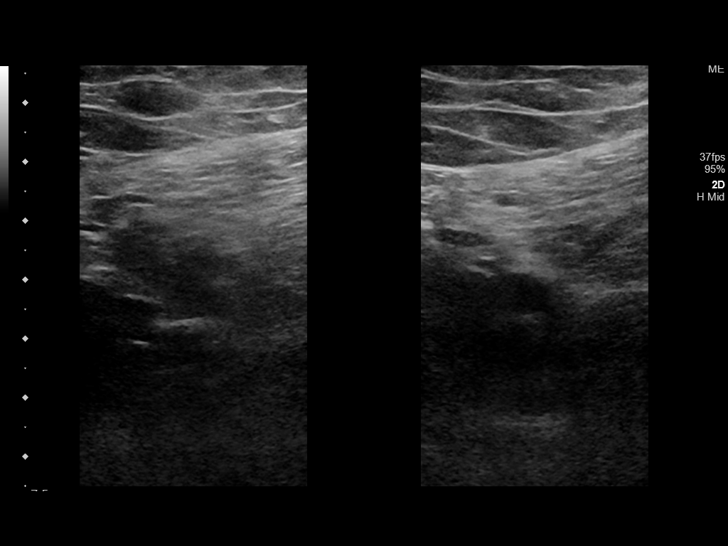
[im 24/33]
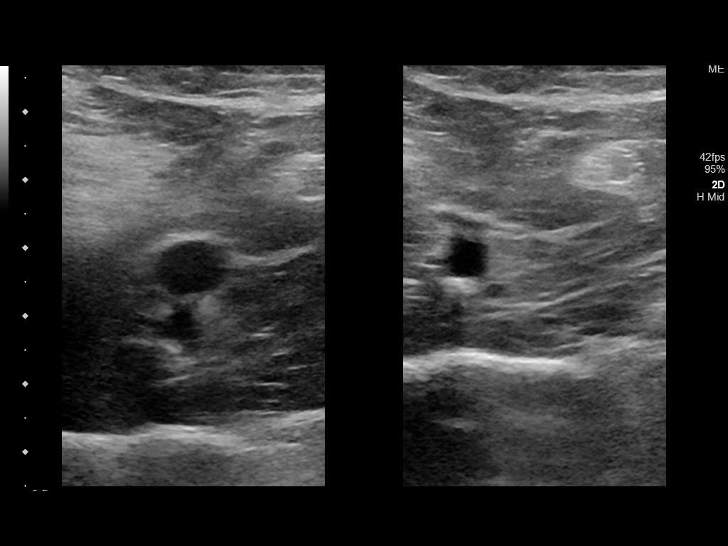
[im 27/33]
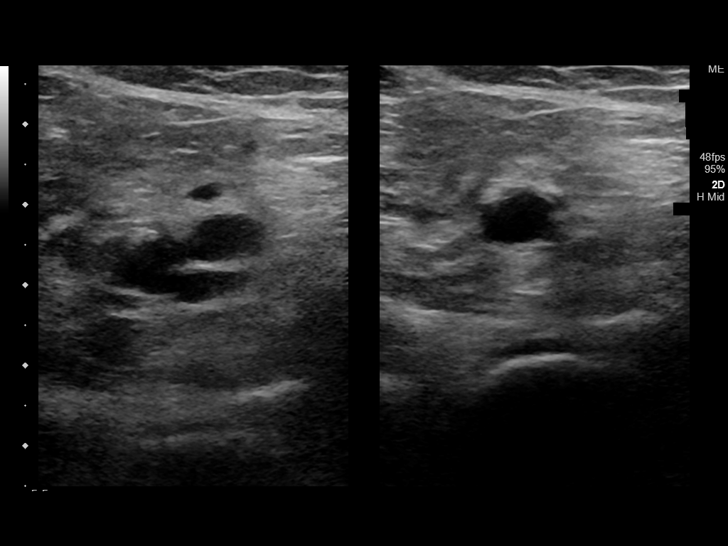
[im 30/33]
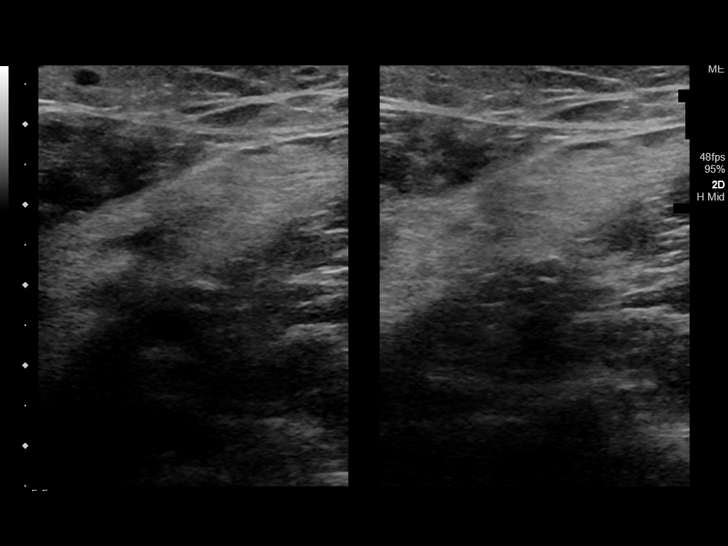
[im 33/33]
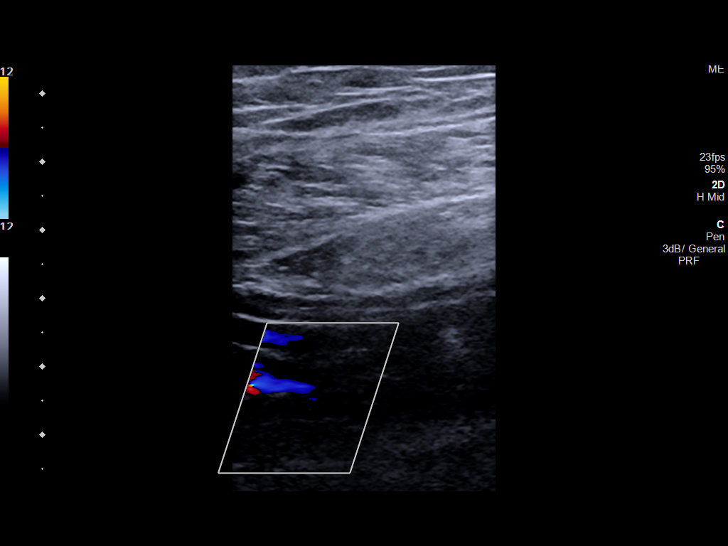

[13 of 24 positions shown; findings below may reference images not displayed]

FINDINGS: Contralateral Common Femoral Vein: Respiratory phasicity is normal
and symmetric with the symptomatic side. No evidence of thrombus.
Normal compressibility.

Common Femoral Vein: No evidence of thrombus. Normal
compressibility, respiratory phasicity and response to augmentation.

Saphenofemoral Junction: No evidence of thrombus. Normal
compressibility and flow on color Doppler imaging.

Profunda Femoral Vein: No evidence of thrombus. Normal
compressibility and flow on color Doppler imaging.

Femoral Vein: No evidence of thrombus. Normal compressibility,
respiratory phasicity and response to augmentation.

Popliteal Vein: No evidence of thrombus. Normal compressibility,
respiratory phasicity and response to augmentation.

Calf Veins: No evidence of thrombus. Normal compressibility and flow
on color Doppler imaging.

Superficial Great Saphenous Vein: No evidence of thrombus. Normal
compressibility.

Venous Reflux:  None.

Other Findings:  None.
IMPRESSION: No evidence of DVT within the left lower extremity.

## 2018-08-08 MED ORDER — KETOROLAC TROMETHAMINE 30 MG/ML IJ SOLN
15.0000 mg | Freq: Once | INTRAMUSCULAR | Status: AC
  Administered 2018-08-08: 15 mg via INTRAMUSCULAR
  Filled 2018-08-08: qty 1

## 2018-08-08 MED ORDER — HYDROCODONE-ACETAMINOPHEN 5-325 MG PO TABS
1.0000 | ORAL_TABLET | Freq: Once | ORAL | Status: AC
  Administered 2018-08-08: 1 via ORAL
  Filled 2018-08-08: qty 1

## 2018-08-08 NOTE — ED Notes (Signed)
Called lab and informed them of the added on blood work.

## 2018-08-08 NOTE — ED Provider Notes (Addendum)
[facility] Emergency Department Provider Note   ____________________________________________   First MD Initiated Contact with Patient 08/08/18 0601     (approximate)  I have reviewed the triage vital signs and the nursing notes.   HISTORY  Chief Complaint Leg Pain    HPI Evan [name] is a 63 y.o. male brought to the ED from home via EMS with a chief complaint of left leg pain.  Patient initially told triage nurse bilateral leg pain but he is more concerned with left leg pain.  States it has been hurting on and off for the past 2 to 3 weeks.  He was recently diagnosed with cellulitis but states that has improved without intervention.  Has been experiencing muscle spasms and tonight awoke with spasms in his left calf.  Denies associated fever, chills, chest pain, shortness of breath, abdominal pain, nausea or vomiting.  Denies recent travel or trauma.  Denies hormone use.  [facility]ed by [facility].   Past Medical History:  Diagnosis Date  . Anginal pain ([place])   . Anxiety disorder   . Asthma   . Atresia of esophagus without fistula   . CAD (coronary artery disease)   . Cellulitis   . CHF (congestive heart failure) ([place])    NYHA CLASS III,CHRONIC,DIASTOLIC  . COPD (chronic obstructive pulmonary disease) ([place])   . Diabetes mellitus without complication ([place])   . Edema    RIGHT LOWER LEG  . Gastroesophageal reflux   . H/O: GI bleed   . History of pneumonia    Remote  . History of scarlet fever    Childhood  . Hyperlipidemia   . Hypertension   . Myocardial infarction ([place]) 2009  . Obesity   . Obstructive sleep apnea   . Pain    CHRONIC BACK / ABDOMINAL  . Panic disorder   . Peripheral venous insufficiency   . PTSD (post-traumatic stress disorder)   . Retinopathy    DIABETIC  . Stasis, venous   . Vertigo     Patient Active Problem List   Diagnosis Date Noted  . Obstructive sleep apnea 04/13/2015  . Diabetes ([place]) 04/13/2015  .  Tooth pain 04/02/2015  . Chest pain, central 04/01/2015  . Cellulitis of right leg 02/17/2015  . Chronic venous insufficiency 09/10/2014  . Chronic diastolic CHF (congestive heart failure) ([place]) 01/09/2014  . GERD (gastroesophageal reflux disease) 09/12/2013  . Obesity, morbid (more than 100 lbs over ideal weight or BMI > 40) ([place]) 07/26/2011  . OLD MYOCARDIAL INFARCTION 03/02/2010  . HYPERLIPIDEMIA, MIXED 03/20/2009  . HYPERTENSION, BENIGN 03/20/2009  . CORONARY ATHEROSCLEROSIS, ARTERY BYPASS GRAFT 03/20/2009    Past Surgical History:  Procedure Laterality Date  . CARDIAC CATHETERIZATION    . CATARACT EXTRACTION Left   . CATARACT EXTRACTION W/PHACO Right 05/03/2017   Procedure: CATARACT EXTRACTION PHACO AND INTRAOCULAR LENS PLACEMENT (IOC);  Surgeon: Leandrew Koyanagi, MD;  Location: [facility] ORS;  Service: Ophthalmology;  Laterality: Right;  [place] 00:35.3 AP% 12.3 CDE 4.33 Fluid Pack lot # [id number]       . CORONARY ANGIOPLASTY WITH STENT PLACEMENT  2002  . CORONARY ANGIOPLASTY WITH STENT PLACEMENT  1999  . CORONARY ARTERY BYPASS GRAFT     x7  . ESOPHAGOGASTRODUODENOSCOPY N/A 09/19/2016   Procedure: ESOPHAGOGASTRODUODENOSCOPY (EGD);  Surgeon: Lollie Sails, MD;  Location: [facility] ENDOSCOPY;  Service: Endoscopy;  Laterality: N/A;    Prior to Admission medications   Medication Sig Start Date End Date Taking? Authorizing Provider  aspirin  81 MG EC tablet Take 81 mg by mouth 2 (two) times daily.     [provider]  budesonide-formoterol (SYMBICORT) 80-4.5 MCG/ACT inhaler Inhale 2 puffs into the lungs 2 (two) times daily.    [provider]  calcium carbonate (OSCAL) 1500 (600 Ca) MG TABS tablet Take 1,500 mg by mouth 2 (two) times daily with a meal.    [provider]  diazepam (VALIUM) 2 MG tablet Take 1 tablet (2 mg total) by mouth every 8 (eight) hours as needed for muscle spasms. Patient not taking: Reported on 03/01/2018 12/31/17   Paulette Blanch, MD    dicyclomine (BENTYL) 10 MG capsule Take 10 mg by mouth 2 (two) times daily. AS NEEDED    [provider]  DULoxetine (CYMBALTA) 30 MG capsule Take 30 mg by mouth daily.    [provider]  esomeprazole (NEXIUM) 40 MG capsule Take 40 mg by mouth daily.     [provider]  fluticasone (FLONASE) 50 MCG/ACT nasal spray Place 2 sprays into both nostrils daily.  12/31/13   [provider]  furosemide (LASIX) 40 MG tablet 40 mg daily. Take 80 mg in the am with 40 mg in the pm.     [provider]  hydrOXYzine (ATARAX/VISTARIL) 25 MG tablet Take 25 mg by mouth at bedtime.    [provider]  ibuprofen (ADVIL,MOTRIN) 600 MG tablet Take 600 mg by mouth every 6 (six) hours as needed.    [provider]  ipratropium-albuterol (DUONEB) 0.5-2.5 (3) MG/3ML SOLN Take 3 mLs by nebulization every 4 (four) hours as needed.    [provider]  lisinopril (PRINIVIL,ZESTRIL) 5 MG tablet Take 5 mg by mouth daily.    [provider]  loratadine (CLARITIN) 10 MG tablet Take 10 mg by mouth daily.    [provider]  meclizine (ANTIVERT) 25 MG tablet Take 25 mg by mouth.    [provider]  metFORMIN (GLUCOPHAGE) 500 MG tablet Take 1 tablet (500 mg total) by mouth 2 (two) times daily with a meal. Patient taking differently: Take 500 mg by mouth at bedtime.  02/20/15   Gouru, Illene Silver, MD  metoprolol succinate (TOPROL XL) 50 MG 24 hr tablet Take by mouth. 06/08/15   [provider]  metoprolol tartrate (LOPRESSOR) 25 MG tablet Take 50 mg by mouth daily.     [provider]  nitroGLYCERIN (NITROSTAT) 0.4 MG SL tablet Place 0.4 mg under the tongue every 5 (five) minutes as needed for chest pain.    [provider]  pantoprazole (PROTONIX) 40 MG tablet Take 40 mg by mouth 2 (two) times daily.     [provider]  phenazopyridine (PYRIDIUM) 200 MG tablet Take 1 tablet (200 mg total) by mouth 3 (three)  times daily as needed for pain. Patient not taking: Reported on 03/01/2018 12/31/17   Paulette Blanch, MD  potassium chloride (K-DUR) 10 MEQ tablet Take 1 tablet (10 mEq total) by mouth daily. 04/14/14   Minna Merritts, MD  predniSONE (DELTASONE) 20 MG tablet Take 20 mg by mouth 3 (three) times daily as needed.    [provider]  PROAIR HFA 108 (90 BASE) MCG/ACT inhaler Inhale 1 puff into the lungs every 4 (four) hours as needed.  12/22/13   [provider]  ranitidine (ZANTAC) 150 MG capsule Take 150 mg by mouth 2 (two) times daily.    [provider]  rosuvastatin (CRESTOR) 20 MG tablet Take  20 mg by mouth daily.    [provider]  sucralfate (CARAFATE) 1 g tablet Take 1 g by mouth 2 (two) times daily.    [provider]    Allergies Penicillin g; Sulfa antibiotics; Tiotropium; and Zoloft [sertraline hcl]  Family History  Problem Relation Age of Onset  . Heart attack Mother   . Hypertension Mother   . Hyperlipidemia Mother   . Heart attack Brother 74       MI  . Coronary artery disease Other     Social History Social History   Tobacco Use  . Smoking status: Never Smoker  . Smokeless tobacco: Never Used  Substance Use Topics  . Alcohol use: No  . Drug use: No    Review of Systems  Constitutional: No fever/chills Eyes: No visual changes. ENT: No sore throat. Cardiovascular: Denies chest pain. Respiratory: Denies shortness of breath. Gastrointestinal: No abdominal pain.  No nausea, no vomiting.  No diarrhea.  No constipation. Genitourinary: Negative for dysuria. Musculoskeletal: Positive for leg pain.  Negative for back pain. Skin: Negative for rash. Neurological: Negative for headaches, focal weakness or numbness.   ____________________________________________   PHYSICAL EXAM:  VITAL SIGNS: ED Triage Vitals  Enc Vitals Group     BP 08/08/18 0148 (!) 143/91     Pulse Rate 08/08/18 0148 88     Resp 08/08/18 0148 18      Temp 08/08/18 0148 97.9 F (36.6 C)     Temp Source 08/08/18 0148 Oral     SpO2 08/08/18 0148 97 %     Weight 08/08/18 0149 253 lb (114.8 kg)     Height 08/08/18 0149 5\' 5"  (1.651 m)     Head Circumference --      Peak Flow --      Pain Score 08/08/18 0154 8     Pain Loc --      Pain Edu? --      Excl. in [place]? --     Constitutional: Alert and oriented. Well appearing and in no acute distress. Eyes: Conjunctivae are normal. PERRL. EOMI. Head: Atraumatic. Nose: No congestion/rhinnorhea. Mouth/Throat: Mucous membranes are moist.  Oropharynx non-erythematous. Neck: No stridor.   Cardiovascular: Normal rate, regular rhythm. Grossly normal heart sounds.  Good peripheral circulation. Respiratory: Normal respiratory effort.  No retractions. Lungs CTAB. Gastrointestinal: Soft and nontender. No distention. No abdominal bruits. No CVA tenderness. Musculoskeletal:  RLE: 1+ nonpitting edema.  Chronic venous stasis ulcers.  No calf tenderness.  2+ distal pulses.  Brisk, less than 5-second capillary refill. LLE: No edema.  Calf is tender to palpation.  No cords palpated.  2+ distal pulses.  Brisk, less than 5-second capillary refill. Neurologic:  Normal speech and language. No gross focal neurologic deficits are appreciated.  Skin:  Skin is warm, dry and intact. No rash noted. Psychiatric: Mood and affect are normal. Speech and behavior are normal.  ____________________________________________   LABS (all labs ordered are listed, but only abnormal results are displayed)  Labs Reviewed  COMPREHENSIVE METABOLIC PANEL - Abnormal; Notable for the following components:      Result Value   Glucose, Bld 150 (*)    All other components within normal limits  CBC WITH DIFFERENTIAL/PLATELET  CK   ____________________________________________  EKG  None ____________________________________________  RADIOLOGY  ED MD interpretation:  Pending  Official radiology report(s): No results  found.  ____________________________________________   PROCEDURES  Procedure(s) performed: None  Procedures  Critical Care performed: No  ____________________________________________  INITIAL IMPRESSION / ASSESSMENT AND PLAN / ED COURSE  As part of my medical decision making, I reviewed the following data within the [place] notes reviewed and incorporated, Labs reviewed, Old chart reviewed and Notes from prior ED visits   63 year old male with chronic venous insufficiency who presents with left leg pain times several weeks.  Differential diagnosis includes but is not limited to DVT, venous stasis, cellulitis, musculoskeletal, infectious, etc.  Laboratory results unremarkable.  Will check CK, obtain Doppler ultrasound to evaluate for DVT.  IM Toradol and Norco will be given for pain.  Will reassess.  Clinical Course as of Aug 08 708  Thu Aug 08, 2018  0710 CK normal.  Awaiting Doppler ultrasound.  Anticipate discharge home if that is unremarkable.  Care transferred to Dr. Quentin Cornwall.   [JS]    Clinical Course User Index [JS] Paulette Blanch, MD     ____________________________________________   FINAL CLINICAL IMPRESSION(S) / ED DIAGNOSES  Final diagnoses:  Pain of left lower extremity     ED Discharge Orders    None       Note:  This document was prepared using Dragon voice recognition software and may include unintentional dictation errors.    Paulette Blanch, MD 08/08/18 0710    Paulette Blanch, MD 08/08/18 [id number]

## 2018-08-08 NOTE — ED Provider Notes (Signed)
[place] negative.  Stable and appropriate for continued outpatient management.   Merlyn Lot, MD 08/08/18 [id number]

## 2018-08-08 NOTE — ED Triage Notes (Signed)
Pt arrives to ED via [facility] from home with c/o bilateral leg pain "off and on" x2 weeks. Pt reports being recently d/x'd with cellulitis, but states no r/x's were prescribed. Pt denies fever, no c/o N/V/D. No reports of CP or SHOB.

## 2018-08-22 ENCOUNTER — Ambulatory Visit: Payer: Medicare (Managed Care) | Admitting: Gastroenterology

## 2018-09-30 ENCOUNTER — Other Ambulatory Visit: Payer: Self-pay | Admitting: Family Medicine

## 2018-09-30 DIAGNOSIS — C61 Malignant neoplasm of prostate: Secondary | ICD-10-CM

## 2018-11-25 ENCOUNTER — Other Ambulatory Visit: Payer: Medicare (Managed Care)

## 2018-11-27 ENCOUNTER — Other Ambulatory Visit: Payer: Self-pay

## 2018-11-28 ENCOUNTER — Inpatient Hospital Stay: Payer: Medicare (Managed Care)

## 2018-12-03 ENCOUNTER — Ambulatory Visit: Payer: Medicare (Managed Care) | Admitting: Radiation Oncology

## 2018-12-20 ENCOUNTER — Other Ambulatory Visit: Payer: Self-pay

## 2018-12-20 ENCOUNTER — Encounter: Payer: Self-pay | Admitting: Emergency Medicine

## 2018-12-20 ENCOUNTER — Emergency Department
Admission: EM | Admit: 2018-12-20 | Discharge: 2018-12-20 | Disposition: A | Discharge status: Home / Self Care | Payer: Medicare (Managed Care) | Attending: Emergency Medicine | Admitting: Emergency Medicine

## 2018-12-20 DIAGNOSIS — Z955 Presence of coronary angioplasty implant and graft: Secondary | ICD-10-CM | POA: Diagnosis not present

## 2018-12-20 DIAGNOSIS — I5032 Chronic diastolic (congestive) heart failure: Secondary | ICD-10-CM | POA: Diagnosis not present

## 2018-12-20 DIAGNOSIS — I110 Hypertensive heart disease with heart failure: Secondary | ICD-10-CM | POA: Insufficient documentation

## 2018-12-20 DIAGNOSIS — Z7901 Long term (current) use of anticoagulants: Secondary | ICD-10-CM | POA: Insufficient documentation

## 2018-12-20 DIAGNOSIS — I83891 Varicose veins of right lower extremities with other complications: Secondary | ICD-10-CM | POA: Insufficient documentation

## 2018-12-20 DIAGNOSIS — E119 Type 2 diabetes mellitus without complications: Secondary | ICD-10-CM | POA: Diagnosis not present

## 2018-12-20 DIAGNOSIS — Z951 Presence of aortocoronary bypass graft: Secondary | ICD-10-CM | POA: Diagnosis not present

## 2018-12-20 DIAGNOSIS — Z7982 Long term (current) use of aspirin: Secondary | ICD-10-CM | POA: Diagnosis not present

## 2018-12-20 DIAGNOSIS — I83899 Varicose veins of unspecified lower extremities with other complications: Secondary | ICD-10-CM

## 2018-12-20 NOTE — ED Triage Notes (Signed)
Pt presents to ED via [facility] with c/o varicose vein to top of R foot. Per EMS bleeding initially controlled upon their arrival, while attempting to clean off dried blood patient began bleeding again. Bleeding controlled upon arrival to ED. Per EMS VSS at this time.

## 2018-12-20 NOTE — ED Provider Notes (Signed)
[facility] Emergency Department Provider Note  Time seen: 11:50 AM  I have reviewed the triage vital signs and the nursing notes.   HISTORY  Chief Complaint Varicose Veins   HPI Evan [name] is a 63 y.o. male with a past medical history of anxiety, asthma, CAD, CHF, COPD, diabetes, hypertension, hyperlipidemia, MI on Xarelto, presents to the emergency department for bleeding.  According to the patient he was sitting on his couch when he looked down and saw significant amount of blood coming from his right foot.  Patient has a history of varicose veins however he has never had a bleed from a varicose vein previously.  Patient states he applied pressure and the bleeding had stopped.  He had called EMS.  Once EMS arrived they attempted to clean the area and the bleeding restarted heavily per patient.  So the patient came to the emergency department for evaluation.  Here the patient appears well, no distress.  Patient has a bandage applied to the right foot and no bleeding at this time.  Patient denies any fever cough or congestion.   Past Medical History:  Diagnosis Date  . Anginal pain ([place])   . Anxiety disorder   . Asthma   . Atresia of esophagus without fistula   . CAD (coronary artery disease)   . Cellulitis   . CHF (congestive heart failure) ([place])    NYHA CLASS III,CHRONIC,DIASTOLIC  . COPD (chronic obstructive pulmonary disease) ([place])   . Diabetes mellitus without complication ([place])   . Edema    RIGHT LOWER LEG  . Gastroesophageal reflux   . H/O: GI bleed   . History of pneumonia    Remote  . History of scarlet fever    Childhood  . Hyperlipidemia   . Hypertension   . Myocardial infarction ([place]) 2009  . Obesity   . Obstructive sleep apnea   . Pain    CHRONIC BACK / ABDOMINAL  . Panic disorder   . Peripheral venous insufficiency   . PTSD (post-traumatic stress disorder)   . Retinopathy    DIABETIC  . Stasis, venous   . Vertigo      Patient Active Problem List   Diagnosis Date Noted  . Obstructive sleep apnea 04/13/2015  . Diabetes ([place]) 04/13/2015  . Tooth pain 04/02/2015  . Chest pain, central 04/01/2015  . Cellulitis of right leg 02/17/2015  . Chronic venous insufficiency 09/10/2014  . Chronic diastolic CHF (congestive heart failure) ([place]) 01/09/2014  . GERD (gastroesophageal reflux disease) 09/12/2013  . Obesity, morbid (more than 100 lbs over ideal weight or BMI > 40) ([place]) 07/26/2011  . OLD MYOCARDIAL INFARCTION 03/02/2010  . HYPERLIPIDEMIA, MIXED 03/20/2009  . HYPERTENSION, BENIGN 03/20/2009  . CORONARY ATHEROSCLEROSIS, ARTERY BYPASS GRAFT 03/20/2009    Past Surgical History:  Procedure Laterality Date  . CARDIAC CATHETERIZATION    . CATARACT EXTRACTION Left   . CATARACT EXTRACTION W/PHACO Right 05/03/2017   Procedure: CATARACT EXTRACTION PHACO AND INTRAOCULAR LENS PLACEMENT (IOC);  Surgeon: Leandrew Koyanagi, MD;  Location: [facility] ORS;  Service: Ophthalmology;  Laterality: Right;  [place] 00:35.3 AP% 12.3 CDE 4.33 Fluid Pack lot # [id number]       . CORONARY ANGIOPLASTY WITH STENT PLACEMENT  2002  . CORONARY ANGIOPLASTY WITH STENT PLACEMENT  1999  . CORONARY ARTERY BYPASS GRAFT     x7  . ESOPHAGOGASTRODUODENOSCOPY N/A 09/19/2016   Procedure: ESOPHAGOGASTRODUODENOSCOPY (EGD);  Surgeon: Lollie Sails, MD;  Location: [facility] ENDOSCOPY;  Service: Endoscopy;  Laterality: N/A;    Prior to Admission medications   Medication Sig Start Date End Date Taking? Authorizing Provider  aspirin 81 MG EC tablet Take 81 mg by mouth 2 (two) times daily.     [provider]  budesonide-formoterol (SYMBICORT) 80-4.5 MCG/ACT inhaler Inhale 2 puffs into the lungs 2 (two) times daily.    [provider]  calcium carbonate (OSCAL) 1500 (600 Ca) MG TABS tablet Take 1,500 mg by mouth 2 (two) times daily with a meal.    [provider]  diazepam (VALIUM) 2 MG tablet Take 1 tablet (2 mg total) by  mouth every 8 (eight) hours as needed for muscle spasms. Patient not taking: Reported on 03/01/2018 12/31/17   Paulette Blanch, MD  dicyclomine (BENTYL) 10 MG capsule Take 10 mg by mouth 2 (two) times daily. AS NEEDED    [provider]  DULoxetine (CYMBALTA) 30 MG capsule Take 30 mg by mouth daily.    [provider]  esomeprazole (NEXIUM) 40 MG capsule Take 40 mg by mouth daily.     [provider]  fluticasone (FLONASE) 50 MCG/ACT nasal spray Place 2 sprays into both nostrils daily.  12/31/13   [provider]  furosemide (LASIX) 40 MG tablet 40 mg daily. Take 80 mg in the am with 40 mg in the pm.     [provider]  hydrOXYzine (ATARAX/VISTARIL) 25 MG tablet Take 25 mg by mouth at bedtime.    [provider]  ibuprofen (ADVIL,MOTRIN) 600 MG tablet Take 600 mg by mouth every 6 (six) hours as needed.    [provider]  ipratropium-albuterol (DUONEB) 0.5-2.5 (3) MG/3ML SOLN Take 3 mLs by nebulization every 4 (four) hours as needed.    [provider]  lisinopril (PRINIVIL,ZESTRIL) 5 MG tablet Take 5 mg by mouth daily.    [provider]  loratadine (CLARITIN) 10 MG tablet Take 10 mg by mouth daily.    [provider]  meclizine (ANTIVERT) 25 MG tablet Take 25 mg by mouth.    [provider]  metFORMIN (GLUCOPHAGE) 500 MG tablet Take 1 tablet (500 mg total) by mouth 2 (two) times daily with a meal. Patient taking differently: Take 500 mg by mouth at bedtime.  02/20/15   Gouru, Illene Silver, MD  metoprolol succinate (TOPROL XL) 50 MG 24 hr tablet Take by mouth. 06/08/15   [provider]  metoprolol tartrate (LOPRESSOR) 25 MG tablet Take 50 mg by mouth daily.     [provider]  nitroGLYCERIN (NITROSTAT) 0.4 MG SL tablet Place 0.4 mg under the tongue every 5 (five) minutes as needed for chest pain.    [provider]  pantoprazole (PROTONIX) 40 MG tablet Take 40 mg by mouth 2 (two) times  daily.     [provider]  phenazopyridine (PYRIDIUM) 200 MG tablet Take 1 tablet (200 mg total) by mouth 3 (three) times daily as needed for pain. Patient not taking: Reported on 03/01/2018 12/31/17   Paulette Blanch, MD  potassium chloride (K-DUR) 10 MEQ tablet Take 1 tablet (10 mEq total) by mouth daily. 04/14/14   Minna Merritts, MD  predniSONE (DELTASONE) 20 MG tablet Take 20 mg by mouth 3 (three) times daily as needed.    [provider]  PROAIR HFA 108 (90 BASE) MCG/ACT inhaler Inhale 1 puff into the lungs every 4 (four) hours as needed.  12/22/13   [provider]  ranitidine (ZANTAC) 150 MG capsule Take  150 mg by mouth 2 (two) times daily.    [provider]  rosuvastatin (CRESTOR) 20 MG tablet Take 20 mg by mouth daily.    [provider]  sucralfate (CARAFATE) 1 g tablet Take 1 g by mouth 2 (two) times daily.    [provider]    Allergies  Allergen Reactions  . Penicillin G Hives  . Sulfa Antibiotics Hives  . Tiotropium   . Zoloft [Sertraline Hcl] Other (See Comments)    Family History  Problem Relation Age of Onset  . Heart attack Mother   . Hypertension Mother   . Hyperlipidemia Mother   . Heart attack Brother 67       MI  . Coronary artery disease Other     Social History Social History   Tobacco Use  . Smoking status: Never Smoker  . Smokeless tobacco: Never Used  Substance Use Topics  . Alcohol use: No  . Drug use: No    Review of Systems Constitutional: Negative for fever. Cardiovascular: Negative for chest pain. Respiratory: Negative for shortness of breath. Gastrointestinal: Negative for abdominal pain Musculoskeletal: Negative for musculoskeletal complaints.  Denies any foot or leg pain. Skin: Bleeding from right foot, hemostatic currently Neurological: Negative for headache All other ROS negative  ____________________________________________   PHYSICAL EXAM:  VITAL SIGNS: ED Triage Vitals   Enc Vitals Group     BP 12/20/18 1123 (!) 145/94     Pulse Rate 12/20/18 1123 99     Resp 12/20/18 1123 (!) 23     Temp 12/20/18 1123 98.2 F (36.8 C)     Temp Source 12/20/18 1123 Oral     SpO2 12/20/18 1123 96 %     Weight 12/20/18 1122 263 lb (119.3 kg)     Height 12/20/18 1122 5\' 5"  (1.651 m)     Head Circumference --      Peak Flow --      Pain Score 12/20/18 1120 0     Pain Loc --      Pain Edu? --      Excl. in [place]? --    Constitutional: Alert and oriented. Well appearing and in no distress.  Obese. Eyes: Normal exam ENT      Head: Normocephalic and atraumatic.      Mouth/Throat: Mucous membranes are moist. Cardiovascular: Normal rate, regular rhythm.  Respiratory: Normal respiratory effort without tachypnea nor retractions. Breath sounds are clear  Gastrointestinal: Soft and nontender. No distention.  Musculoskeletal: Nontender with normal range of motion in all extremities. Neurologic:  Normal speech and language. No gross focal neurologic deficits Skin: Varicose veins to lower extremities, patient appears to have a bleeding varicose vein to the dorsal aspect of the right foot which has since stopped bleeding.  Dried blood present over foot. Psychiatric: Mood and affect are normal.   ____________________________________________   INITIAL IMPRESSION / ASSESSMENT AND PLAN / ED COURSE  Pertinent labs & imaging results that were available during my care of the patient were reviewed by me and considered in my medical decision making (see chart for details).   Patient presents to the emergency department for bleeding from his right foot.  Clinically bleeding is consistent with varicose vein bleed.  We will attempt to clean the area.  If bleeding recurs we will attempt silver nitrate and Dermabond.  If bleeding does not recur we will still cover with Dermabond to prevent recurrence.  At this time the patient appears well, vitals are reassuring.  I do not believe blood work  would be beneficial at this time.  I have cleaned the area, no further bleeding.  I see the area that the bleeding likely occurred from.  Covered with Dermabond.  We will cover with a Tegaderm once dry and have the patient follow-up with his PCP.  Patient agreeable to plan of care.  He will not get it wet for 48 hours.  Evan [name] was evaluated in Emergency Department on 12/20/2018 for the symptoms described in the history of present illness. He was evaluated in the context of the global COVID-19 pandemic, which necessitated consideration that the patient might be at risk for infection with the SARS-CoV-2 virus that causes COVID-19. Institutional protocols and algorithms that pertain to the evaluation of patients at risk for COVID-19 are in a state of rapid change based on information released by regulatory bodies including the CDC and federal and state organizations. These policies and algorithms were followed during the patient's care in the ED.  ____________________________________________   FINAL CLINICAL IMPRESSION(S) / ED DIAGNOSES  Varicose bleed   Harvest Dark, MD 12/20/18 1214

## 2018-12-20 NOTE — ED Notes (Signed)
NAD Noted at time of D/C. PT taken to lobby via wheelchair to wait for [facility] to pick him up. Pt denies comments/concerns regarding D/C instructions.

## 2019-01-14 LAB — HM DIABETES EYE EXAM

## 2019-01-29 ENCOUNTER — Other Ambulatory Visit: Payer: Medicare (Managed Care)

## 2019-02-04 ENCOUNTER — Other Ambulatory Visit: Payer: Self-pay

## 2019-02-05 ENCOUNTER — Other Ambulatory Visit: Payer: Self-pay

## 2019-02-05 ENCOUNTER — Ambulatory Visit
Admission: RE | Admit: 2019-02-05 | Discharge: 2019-02-05 | Disposition: A | Discharge status: Home / Self Care | Payer: Medicare (Managed Care) | Source: Ambulatory Visit | Attending: Radiation Oncology | Admitting: Radiation Oncology

## 2019-02-05 ENCOUNTER — Encounter: Payer: Self-pay | Admitting: Radiation Oncology

## 2019-02-05 VITALS — BP 164/92 | HR 82 | Temp 99.8°F | Resp 18 | Wt 240.2 lb

## 2019-02-05 DIAGNOSIS — C61 Malignant neoplasm of prostate: Secondary | ICD-10-CM | POA: Insufficient documentation

## 2019-02-05 DIAGNOSIS — R300 Dysuria: Secondary | ICD-10-CM | POA: Diagnosis not present

## 2019-02-05 DIAGNOSIS — Z923 Personal history of irradiation: Secondary | ICD-10-CM | POA: Insufficient documentation

## 2019-02-05 NOTE — Progress Notes (Signed)
Radiation Oncology Follow up Note  Name: Evan [name]   Date:   02/05/2019 MRN:  [id number] DOB: 10-26-1955    This 63 y.o. male presents to the clinic today for 11-month follow-up status post IMRT radiation therapy to his prostate and pelvic nodes for Gleason 8 (4+4) adenocarcinoma of the prostate presenting with a PSA of 30.  REFERRING PROVIDER: [facility]*  HPI: Patient is a 63 year old male now seen out 10 months having completed IMRT radiation therapy to his prostate and pelvic nodes for Gleason 8 (4+4) adenocarcinoma the prostate presenting with a PSA of 30 he is seen today in routine follow-up he is doing well specifically denies any increased lower urinary tract symptoms diarrhea or fatigue.  He states he sometimes has occasional burning I have suggested cranberry juice for that.  His most recent PSA is less than 0.2 he continues on androgen deprivation therapy..  COMPLICATIONS OF TREATMENT: none  FOLLOW UP COMPLIANCE: keeps appointments   PHYSICAL EXAM:  BP (!) 164/92   Pulse 82   Temp 99.8 F (37.7 C)   Resp 18   Wt 240 lb 3.1 oz (109 kg)   BMI 39.97 kg/m  Well-developed well-nourished patient in NAD. HEENT reveals PERLA, EOMI, discs not visualized.  Oral cavity is clear. No oral mucosal lesions are identified. Neck is clear without evidence of cervical or supraclavicular adenopathy. Lungs are clear to A&P. Cardiac examination is essentially unremarkable with regular rate and rhythm without murmur rub or thrill. Abdomen is benign with no organomegaly or masses noted. Motor sensory and DTR levels are equal and symmetric in the upper and lower extremities. Cranial nerves II through XII are grossly intact. Proprioception is intact. No peripheral adenopathy or edema is identified. No motor or sensory levels are noted. Crude visual fields are within normal range.  RADIOLOGY RESULTS: No current films for review  PLAN: Present time patient is doing well under excellent  biochemical control of his prostate cancer.  He will continue on androgen deprivation therapy.  That [facility] provided [facility].  I have asked to see him back in 1 year with a PSA prior to visit.  Patient knows to call at anytime with any concerns.  I would like to take this opportunity to thank you for allowing me to participate in the care of your patient.Noreene Filbert, MD

## 2019-02-10 ENCOUNTER — Other Ambulatory Visit: Payer: Medicare (Managed Care)

## 2019-04-21 ENCOUNTER — Other Ambulatory Visit: Payer: Medicare (Managed Care)

## 2019-05-07 ENCOUNTER — Other Ambulatory Visit: Payer: Self-pay | Admitting: Family Medicine

## 2019-05-07 DIAGNOSIS — H539 Unspecified visual disturbance: Secondary | ICD-10-CM

## 2019-05-07 DIAGNOSIS — F22 Delusional disorders: Secondary | ICD-10-CM

## 2019-05-20 ENCOUNTER — Ambulatory Visit
Admission: RE | Admit: 2019-05-20 | Discharge: 2019-05-20 | Disposition: A | Discharge status: Home / Self Care | Payer: Medicare (Managed Care) | Source: Ambulatory Visit | Attending: Family Medicine | Admitting: Family Medicine

## 2019-05-20 DIAGNOSIS — M810 Age-related osteoporosis without current pathological fracture: Secondary | ICD-10-CM | POA: Diagnosis not present

## 2019-05-20 DIAGNOSIS — C61 Malignant neoplasm of prostate: Secondary | ICD-10-CM | POA: Insufficient documentation

## 2019-05-22 ENCOUNTER — Ambulatory Visit
Admission: RE | Admit: 2019-05-22 | Discharge: 2019-05-22 | Disposition: A | Discharge status: Home / Self Care | Payer: Medicare (Managed Care) | Source: Ambulatory Visit | Attending: Family Medicine | Admitting: Family Medicine

## 2019-05-22 ENCOUNTER — Other Ambulatory Visit: Payer: Self-pay

## 2019-05-22 DIAGNOSIS — F22 Delusional disorders: Secondary | ICD-10-CM

## 2019-05-22 DIAGNOSIS — H539 Unspecified visual disturbance: Secondary | ICD-10-CM | POA: Diagnosis present

## 2019-05-22 LAB — POCT I-STAT CREATININE: Creatinine, Ser: 0.9 mg/dL (ref 0.61–1.24)

## 2019-05-22 IMAGING — CT CT ANGIO HEAD
3 of 10 series · 17 of 47 positions shown · IV contrast (omnipaque)
Comparison: Head CT [DATE]

CLINICAL DATA: Dizziness and visual disturbance. Delusions.
Congestive heart failure.

EXAM:
CT ANGIOGRAPHY HEAD
TECHNIQUE: Multidetector CT imaging of the head was performed using the
standard protocol during bolus administration of intravenous
contrast. Multiplanar CT image reconstructions and MIPs were
obtained to evaluate the vascular anatomy.
CONTRAST:  75mL OMNIPAQUE IOHEXOL 350 MG/ML SOLN

[Series 10: ax thin · axial · 0.36mm/px · z∈[+465,+591]mm · 11 of 152 slices shown]
[im 11/152  brain]
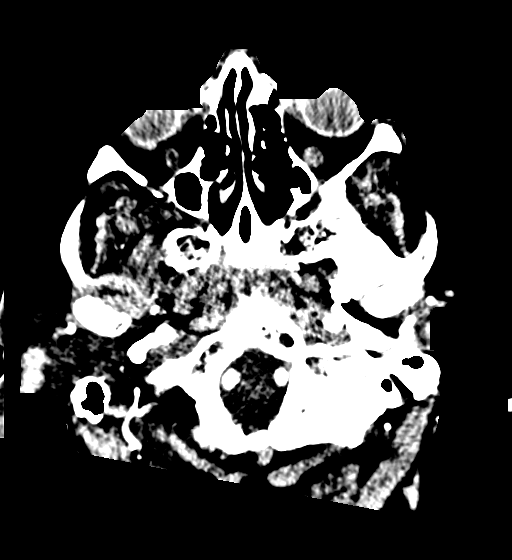
[im 21/152  bone]
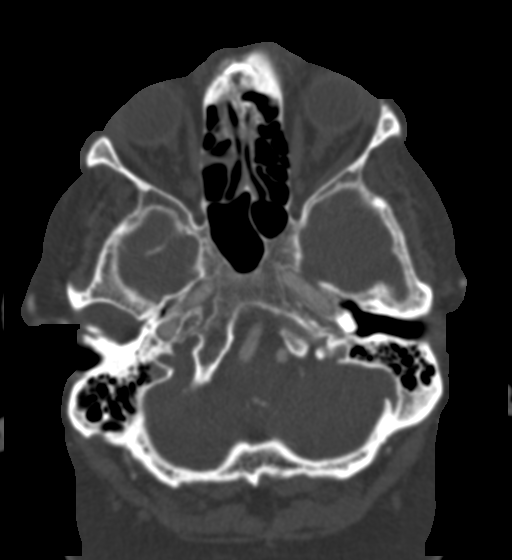
[im 41/152  brain]
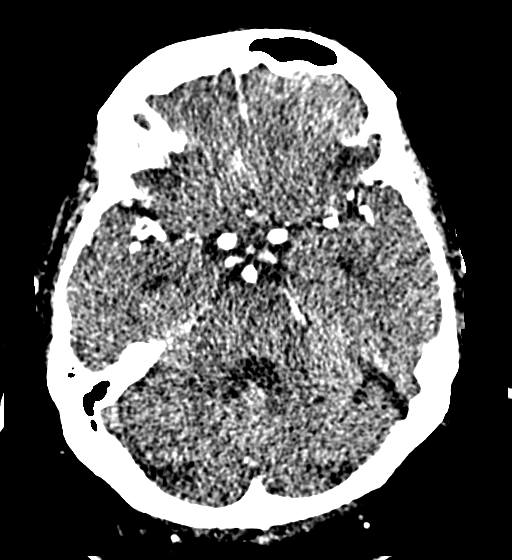
[im 51/152  bone]
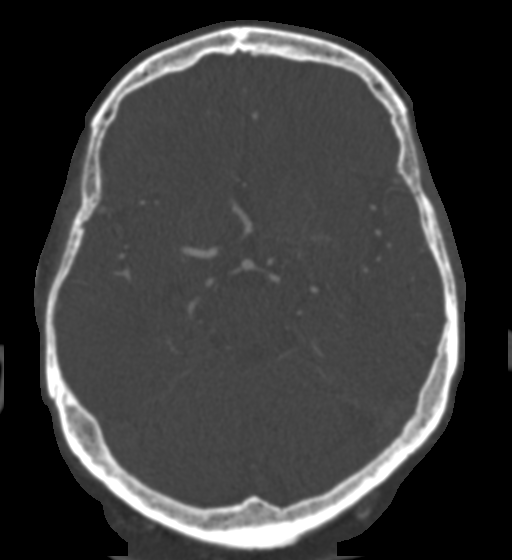
[im 61/152  brain]
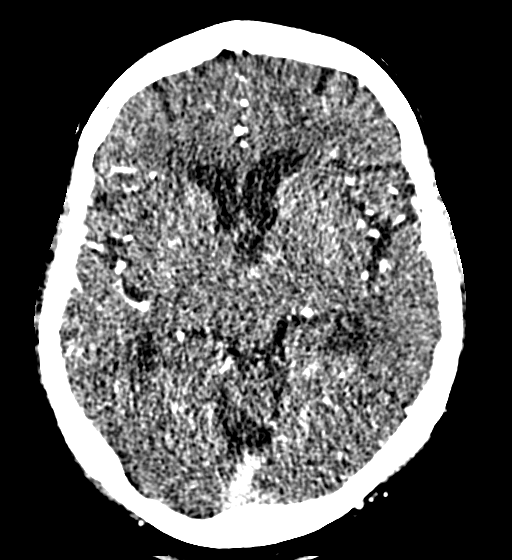
[im 81/152  bone]
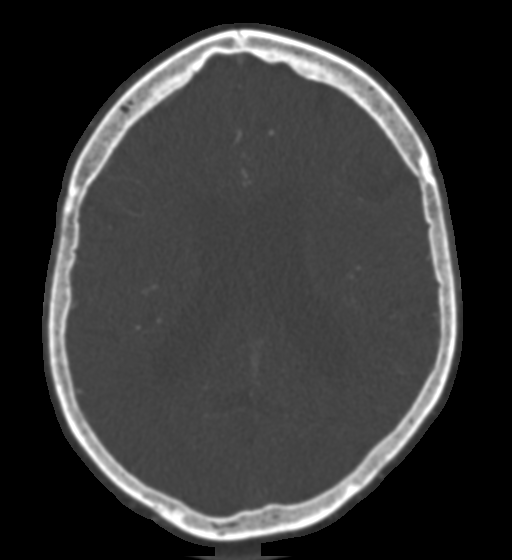
[im 91/152  brain]
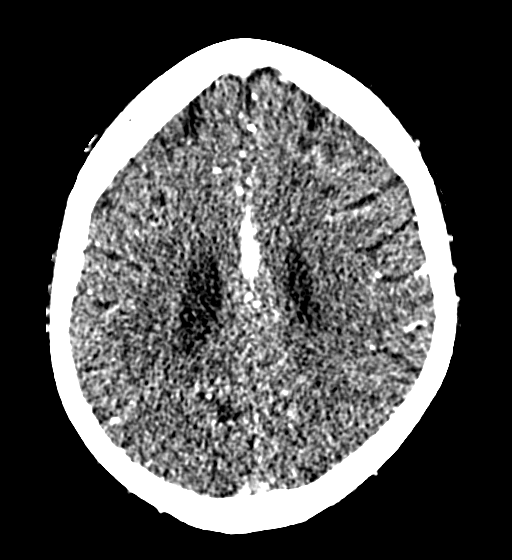
[im 101/152  bone]
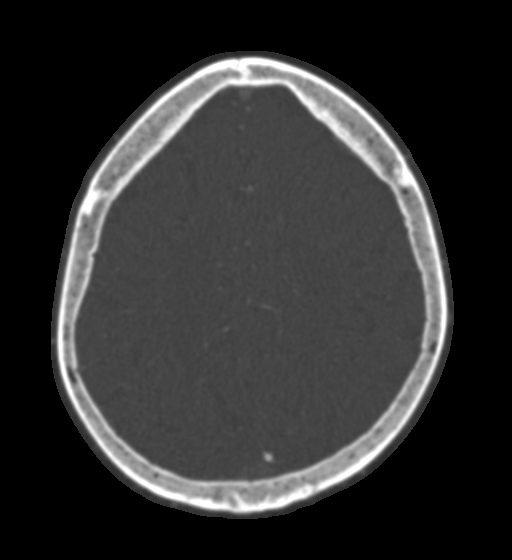
[im 111/152  brain]
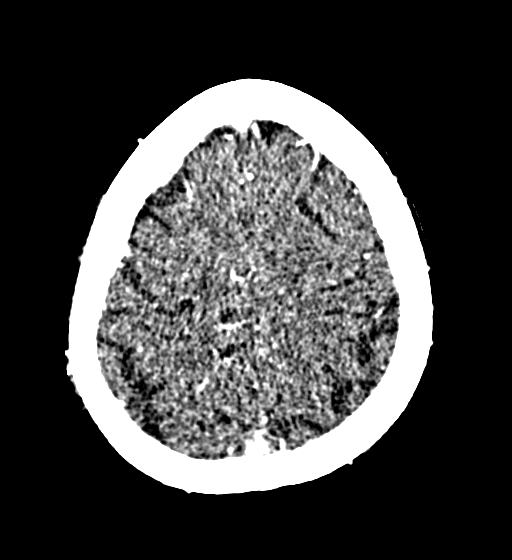
[im 131/152  bone]
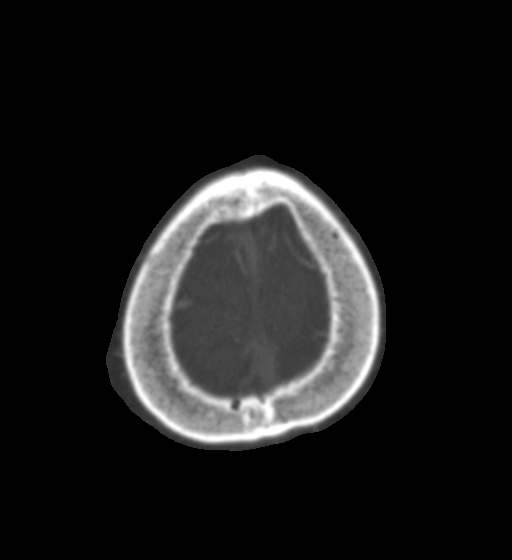
[im 141/152  brain]
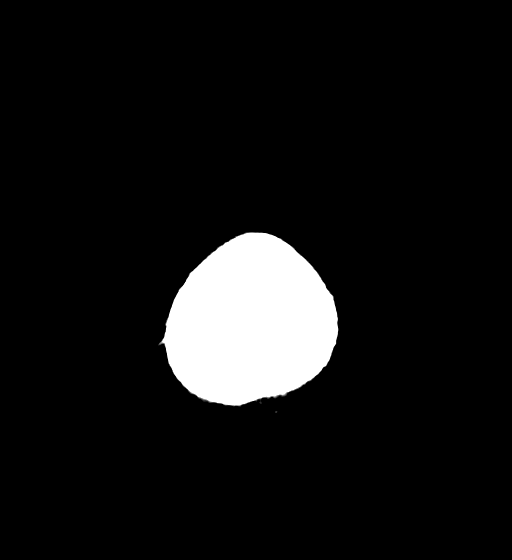

[Series 12: cor thin · coronal · 0.30mm/px · 3 of 201 slices shown]
[im 67/201  brain]
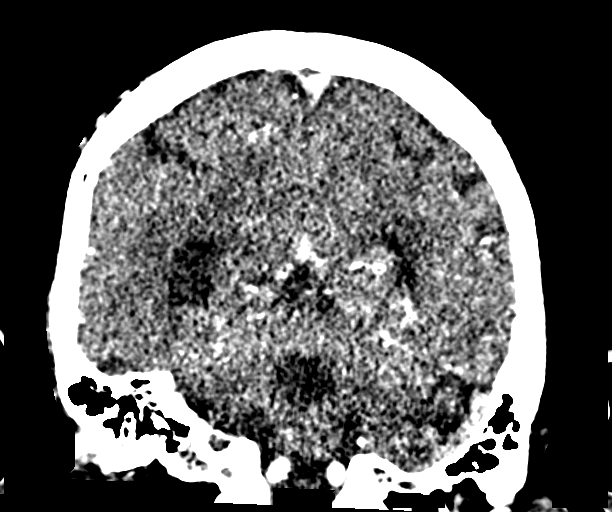
[im 101/201  brain]
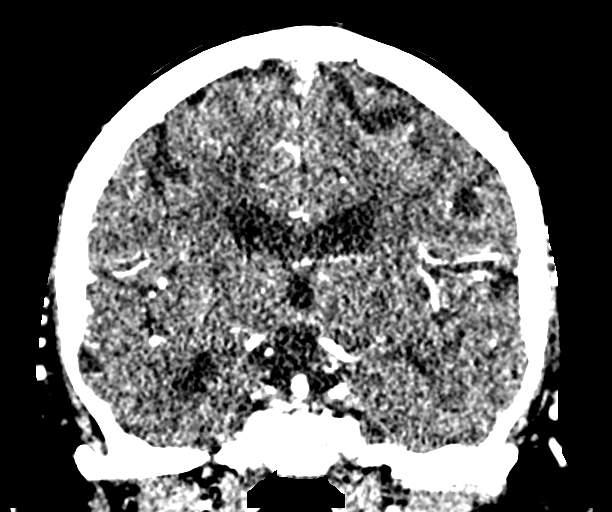
[im 134/201  brain]
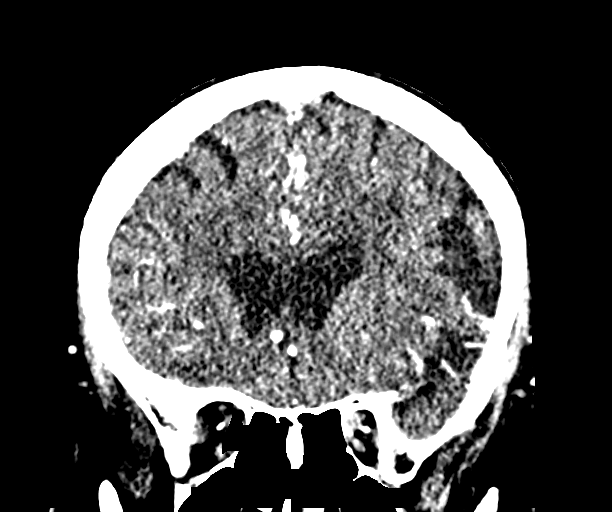

[Series 14: sag thin · sagittal · 0.30mm/px · 3 of 184 slices shown]
[im 46/184  brain]
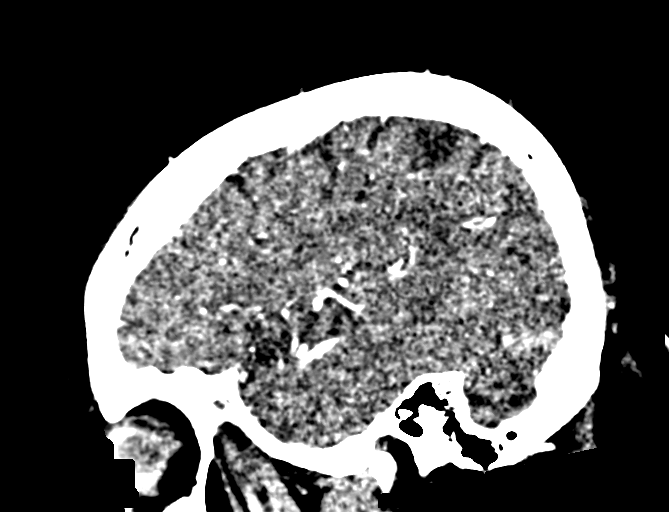
[im 92/184  brain]
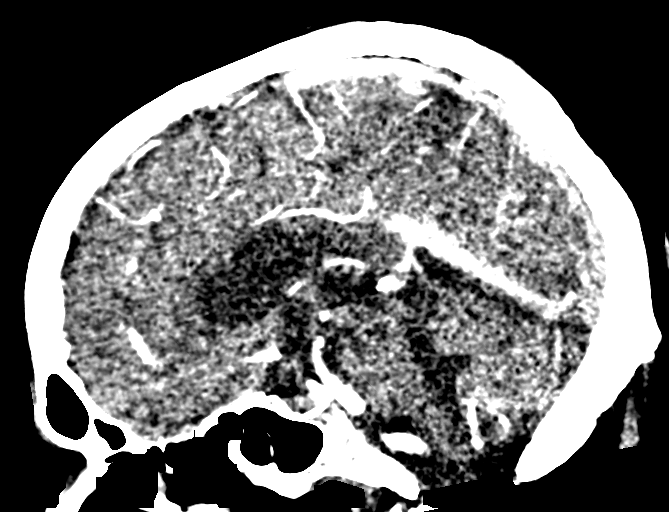
[im 138/184  brain]
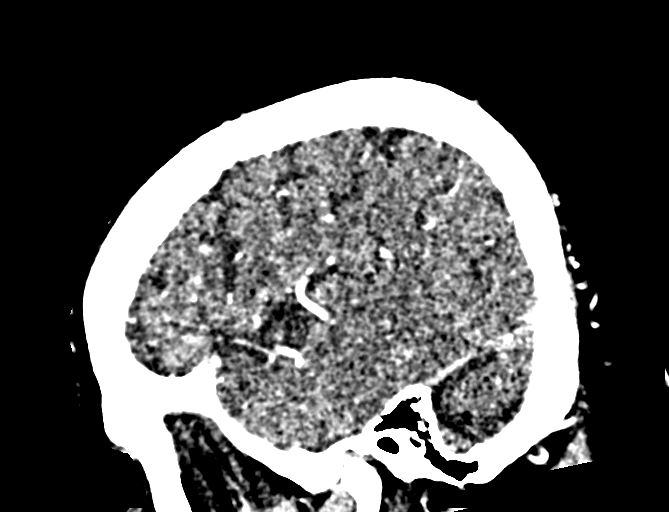

[17 of 47 positions shown; findings below may reference images not displayed]

FINDINGS: CT HEAD

Brain: The brain does not show accelerated atrophy. There are mild
chronic appearing small vessel changes of the deep white matter. No
sign of cortical or large vessel territory infarction. No mass,
hemorrhage, hydrocephalus or extra-axial collection.

Vascular: There is atherosclerotic calcification of the major
vessels at the base of the brain.

Skull: Negative

Sinuses: Clear

Orbits: Normal

CTA HEAD

Anterior circulation: Both internal carotid arteries are patent
through the skull base and siphon regions. There is ordinary siphon
atherosclerotic calcification. There is 50% stenosis of the
supraclinoid ICA on the left. No stenosis greater than 30% on the
right. The anterior and middle cerebral vessels are patent without
proximal stenosis, aneurysm or vascular malformation. No sign of
large or medium vessel branch occlusion. Mild distal vessel
atherosclerotic irregularity.

Posterior circulation: Both vertebral arteries are patent through
the foramen magnum to the basilar. There is atherosclerotic
calcification of the V4 segments but no stenosis. No basilar
stenosis. Superior cerebellar and posterior cerebral arteries appear
patent and normal. Mild distal vessel atherosclerotic irregularity.

Venous sinuses: Patent and normal.

Anatomic variants: Normal
IMPRESSION: No large or medium vessel branch occlusion or correctable proximal
stenosis. 50% stenosis of the supraclinoid ICA on the left. Mild
distal vessel atherosclerotic irregularity.

Head CT does not show any acute finding. Mild chronic small-vessel
change of the hemispheric white matter.

## 2019-05-22 MED ORDER — IOHEXOL 350 MG/ML SOLN
75.0000 mL | Freq: Once | INTRAVENOUS | Status: AC | PRN
  Administered 2019-05-22: 75 mL via INTRAVENOUS

## 2019-08-10 ENCOUNTER — Encounter: Payer: Self-pay | Admitting: Nurse Practitioner

## 2019-08-10 DIAGNOSIS — F419 Anxiety disorder, unspecified: Secondary | ICD-10-CM | POA: Insufficient documentation

## 2019-08-10 DIAGNOSIS — E1169 Type 2 diabetes mellitus with other specified complication: Secondary | ICD-10-CM | POA: Insufficient documentation

## 2019-08-10 DIAGNOSIS — G4733 Obstructive sleep apnea (adult) (pediatric): Secondary | ICD-10-CM | POA: Insufficient documentation

## 2019-08-10 DIAGNOSIS — F200 Paranoid schizophrenia: Secondary | ICD-10-CM | POA: Insufficient documentation

## 2019-08-11 ENCOUNTER — Ambulatory Visit (INDEPENDENT_AMBULATORY_CARE_PROVIDER_SITE_OTHER): Payer: Medicare Other | Admitting: Nurse Practitioner

## 2019-08-11 ENCOUNTER — Other Ambulatory Visit: Payer: Self-pay

## 2019-08-11 ENCOUNTER — Encounter: Payer: Self-pay | Admitting: Nurse Practitioner

## 2019-08-11 VITALS — BP 138/86 | HR 92 | Temp 98.7°F | Ht 62.6 in | Wt 248.2 lb

## 2019-08-11 DIAGNOSIS — I5032 Chronic diastolic (congestive) heart failure: Secondary | ICD-10-CM | POA: Diagnosis not present

## 2019-08-11 DIAGNOSIS — E1159 Type 2 diabetes mellitus with other circulatory complications: Secondary | ICD-10-CM

## 2019-08-11 DIAGNOSIS — E785 Hyperlipidemia, unspecified: Secondary | ICD-10-CM

## 2019-08-11 DIAGNOSIS — E1169 Type 2 diabetes mellitus with other specified complication: Secondary | ICD-10-CM

## 2019-08-11 DIAGNOSIS — I25708 Atherosclerosis of coronary artery bypass graft(s), unspecified, with other forms of angina pectoris: Secondary | ICD-10-CM

## 2019-08-11 DIAGNOSIS — F419 Anxiety disorder, unspecified: Secondary | ICD-10-CM

## 2019-08-11 DIAGNOSIS — I872 Venous insufficiency (chronic) (peripheral): Secondary | ICD-10-CM

## 2019-08-11 DIAGNOSIS — E1165 Type 2 diabetes mellitus with hyperglycemia: Secondary | ICD-10-CM | POA: Diagnosis not present

## 2019-08-11 DIAGNOSIS — I152 Hypertension secondary to endocrine disorders: Secondary | ICD-10-CM

## 2019-08-11 DIAGNOSIS — I10 Essential (primary) hypertension: Secondary | ICD-10-CM

## 2019-08-11 DIAGNOSIS — K219 Gastro-esophageal reflux disease without esophagitis: Secondary | ICD-10-CM

## 2019-08-11 NOTE — Assessment & Plan Note (Signed)
Recommend continued focus on healthy diet choices and regular physical activity (30 minutes 5 days a week).  

## 2019-08-11 NOTE — Assessment & Plan Note (Signed)
Chronic, ongoing with some grandiose thought processes present.  Continue current medication regimen and adjust as needed.  Will place CCM referral, would benefit from ongoing discussion of medication regimen and addition of pill packs to assist with medication adherence, at this time unsure he is taking entire medication regimen.

## 2019-08-11 NOTE — Assessment & Plan Note (Signed)
Has significant history of cellulitis treatment multiple times in past.  Recommend continued use of compression hose on in AM and off at HS.  If recurrent infection or ongoing circulation issues, refer to vascular.

## 2019-08-11 NOTE — Assessment & Plan Note (Signed)
Chronic, ongoing.  No recent A1C available.  Will continue current medication regimen at this time and adjust as needed.  Plan to obtain records from [place] and will obtain baseline clinic labs next visit.  Return in 4 weeks for annual physical.

## 2019-08-11 NOTE — Assessment & Plan Note (Signed)
Chronic, ongoing.  BP at goal today.  Recommend checking these at home a few mornings a week and documenting.  Continue current medication regimen and adjust as needed.  Will place referral to cardiology per patient request.  Plan to obtain records from [facility] and will obtain baseline clinic labs next visit.  Return in 4 weeks for annual physical.

## 2019-08-11 NOTE — Progress Notes (Signed)
New Patient Office Visit  Subjective:  Patient ID: Evan [name], male    DOB: [birth year]  Age: 64 y.o. MRN: [id number]  CC:  Chief Complaint  Patient presents with  . Establish Care    HPI Evan [name] presents for new patient visit to establish care.  Introduced to Designer, jewellery role and practice setting.  All questions answered.  Was at [facility], but left this program.  Was with them for 6 years, but states the HIPPA laws frustrated him and he "could not be friends with the CMAs" plus reports "they never believed me when I told them I know Evan [name] and email with her, but I could show them all the emails".    DIABETES He was taken off Metformin awhile back per his report, only taking Jardiance.  He states they did check A1C recently, but have no records from [facility].  Last A1C in chart is from 2012 and was 6.3%.  He reports they have been below 7 the past year. Hypoglycemic episodes:no Polydipsia/polyuria: no Visual disturbance: no Chest pain: no Paresthesias: no Glucose Monitoring: no, they told him he could stop checking  Accucheck frequency: Not Checking  Fasting glucose:  [name] prandial:  Evening:  Before meals: Taking Insulin?: no  Long acting insulin:  Short acting insulin: Blood Pressure Monitoring: a few times a week Retinal Examination: Up to Date Foot Exam: Not up to Date Pneumovax: unknown Influenza: Up to Date Aspirin: yes   HYPERTENSION / HYPERLIPIDEMIA/HF Was at one time followed by Evan [name], but has not been seen since 2017.  Would like to return to cardiology, but "someone new", had seen Evan [name] before in 2016 and would like to return to that clinic.  Has history of MI several years ago.  Current medications include NTG as needed (no use in several months), Metoprolol XL, Lasix, Xarelto, and ASA.  Has inhalers on his med list, but did not bring to visit today and reports he is not using.  Not a current smoker.  Had been followed in past by  Evan [name] with HF clinic, but appears not to have been seen since 2016.  Would like to return as is concerned about heart health and "feel [facility] did not care about that".   On review "coronary artery disease with bypass surgery in 2009 with 6 vessel bypass, numerous stenting dating back to the early 2000".  Last echo 2015 EF 50-55%. Unsure if taking Eliquis, poor historian, did not note in plastic bag he brought to office today + can not find documentation on past cardiology notes for this medication.  [facility] notes not available. Satisfied with current treatment? yes Duration of hypertension: chronic BP monitoring frequency: a few times a week BP range: 110-130/70's with highest SBP 160's BP medication side effects: no Duration of hyperlipidemia: chronic Cholesterol medication side effects: no Cholesterol supplements: none Medication compliance: good compliance Aspirin: yes Recent stressors: no Recurrent headaches: no Visual changes: no Palpitations: no Dyspnea: no Chest pain: no Lower extremity edema: yes, baseline Dizzy/lightheaded: no   PROSTATE CA: Followed by Evan [name] and last seen by Evan [name] on 02/05/2019.  He states he is currently in remission.  He received radiation therapy.  Reports that "god told me I had cancer and that is how they found it".  He did not require chemotherapy per his report.  ANXIETY/STRESS Reports [facility] discharged him because they "said I was delusional".  Reports his family members are the Evan [name], Evan [name] and  Evan [name], but no one believed him.  States they are distant relatives and he can "prove this".  Also reports he is friends with Evan [name], has emails on his phone from them per his report.  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  Recent CT head on October 2020 did note mild chronic small-vessel change of the hemispheric white matter.    Duration:stable Anxious mood: yes  Excessive worrying: no Irritability: no  Sweating: no Nausea: no               Palpitations:no Hyperventilation: no Panic attacks: no Agoraphobia: no  Obscessions/compulsions: no Depressed mood: no Depression screen [facility] 2/9 08/11/2019 03/01/2018 04/13/2015  Decreased Interest 0 0 1  Down, Depressed, Hopeless 0 0 0  PHQ - 2 Score 0 0 1  Altered sleeping 0 - -  Tired, decreased energy 0 - -  Change in appetite 0 - -  Feeling bad or failure about yourself  0 - -  Trouble concentrating 0 - -  Moving slowly or fidgety/restless 0 - -  Suicidal thoughts 0 - -  PHQ-9 Score 0 - -   Anhedonia: no Weight changes: no Insomnia: no none  Hypersomnia: no Fatigue/loss of energy: no Feelings of worthlessness: no Feelings of guilt: no Impaired concentration/indecisiveness: yes Suicidal ideations: no  Crying spells: no Recent Stressors/Life Changes: no   Relationship problems: no   Family stress: no     Financial stress: no    Job stress: no    Recent death/loss: no  Past Medical History:  Diagnosis Date  . Anginal pain ([place])   . Anxiety disorder   . Asthma   . Atresia of esophagus without fistula   . CAD (coronary artery disease)   . Cellulitis   . CHF (congestive heart failure) ([place])    NYHA CLASS III,CHRONIC,DIASTOLIC  . COPD (chronic obstructive pulmonary disease) ([place])   . Diabetes mellitus without complication ([place])   . Edema    RIGHT LOWER LEG  . Gastroesophageal reflux   . H/O: GI bleed   . History of pneumonia    Remote  . History of scarlet fever    Childhood  . Hyperlipidemia   . Hypertension   . Myocardial infarction ([place]) 2009  . Obesity   . Obstructive sleep apnea   . Pain    CHRONIC BACK / ABDOMINAL  . Panic disorder   . Peripheral venous insufficiency   . PTSD ([name]-traumatic stress disorder)   . Retinopathy    DIABETIC  . Stasis, venous   . Vertigo     Past Surgical History:  Procedure Laterality Date  .  CARDIAC CATHETERIZATION    . CATARACT EXTRACTION Left   . CATARACT EXTRACTION W/PHACO Right 05/03/2017   Procedure: CATARACT EXTRACTION PHACO AND INTRAOCULAR LENS PLACEMENT (IOC);  Surgeon: Leandrew Koyanagi, MD;  Location: [facility] ORS;  Service: Ophthalmology;  Laterality: Right;  [place] 00:35.3 AP% 12.3 CDE 4.33 Fluid Pack lot # [id number]       . CORONARY ANGIOPLASTY WITH STENT PLACEMENT  2002  . CORONARY ANGIOPLASTY WITH STENT PLACEMENT  1999  . CORONARY ARTERY BYPASS GRAFT     x7  . ESOPHAGOGASTRODUODENOSCOPY N/A 09/19/2016   Procedure: ESOPHAGOGASTRODUODENOSCOPY (EGD);  Surgeon: Lollie Sails, MD;  Location: [facility] ENDOSCOPY;  Service: Endoscopy;  Laterality: N/A;    Family History  Problem Relation Age of Onset  . Heart attack Mother   .  Hypertension Mother   . Hyperlipidemia Mother   . Heart attack Brother 63       MI  . Coronary artery disease Other     Social History   Socioeconomic History  . Marital status: Single    Spouse name: Not on file  . Number of children: Not on file  . Years of education: Not on file  . Highest education level: Not on file  Occupational History    Comment: disabled  Tobacco Use  . Smoking status: Never Smoker  . Smokeless tobacco: Never Used  Substance and Sexual Activity  . Alcohol use: No  . Drug use: No  . Sexual activity: Not on file  Other Topics Concern  . Not on file  Social History Narrative   Disabled   Single   Social Determinants of Health   Financial Resource Strain:   . Difficulty of Paying Living Expenses: Not on file  Food Insecurity:   . Worried About Charity fundraiser in the Last Year: Not on file  . Ran Out of Food in the Last Year: Not on file  Transportation Needs:   . Lack of Transportation (Medical): Not on file  . Lack of Transportation (Non-Medical): Not on file  Physical Activity:   . Days of Exercise per Week: Not on file  . Minutes of Exercise per Session: Not on file  Stress:   . Feeling  of Stress : Not on file  Social Connections:   . Frequency of Communication with Friends and Family: Not on file  . Frequency of Social Gatherings with Friends and Family: Not on file  . Attends Religious Services: Not on file  . Active Member of Clubs or Organizations: Not on file  . Attends Archivist Meetings: Not on file  . Marital Status: Not on file  Intimate Partner Violence:   . Fear of Current or Ex-Partner: Not on file  . Emotionally Abused: Not on file  . Physically Abused: Not on file  . Sexually Abused: Not on file    ROS Review of Systems  Constitutional: Negative for activity change, diaphoresis, fatigue and fever.  Respiratory: Negative for cough, chest tightness, shortness of breath and wheezing.   Cardiovascular: Positive for leg swelling (baseline per his report). Negative for chest pain and palpitations.  Gastrointestinal: Negative for abdominal distention, abdominal pain, constipation, diarrhea, nausea and vomiting.  Endocrine: Negative for cold intolerance, heat intolerance, polydipsia, polyphagia and polyuria.  Neurological: Negative for dizziness, syncope, weakness, light-headedness, numbness and headaches.  Psychiatric/Behavioral: Negative for decreased concentration, self-injury, sleep disturbance and suicidal ideas. The patient is nervous/anxious.     Objective:   Today's Vitals: BP 138/86 (BP Location: Right Arm, Patient Position: Sitting, Cuff Size: Normal)   Pulse 92   Temp 98.7 F (37.1 C) (Oral)   Ht 5' 2.6" (1.59 m)   Wt 248 lb 3.2 oz (112.6 kg)   SpO2 98%   BMI 44.53 kg/m   Physical Exam Vitals and nursing note reviewed.  Constitutional:      General: He is awake. He is not in acute distress.    Appearance: He is well-developed. He is morbidly obese. He is not ill-appearing.  HENT:     Head: Normocephalic and atraumatic.     Right Ear: Hearing normal. No drainage.     Left Ear: Hearing normal. No drainage.  Eyes:     General:  Lids are normal.        Right eye: No  discharge.        Left eye: No discharge.     Conjunctiva/sclera: Conjunctivae normal.     Pupils: Pupils are equal, round, and reactive to light.  Neck:     Thyroid: No thyromegaly.     Vascular: No carotid bruit.  Cardiovascular:     Rate and Rhythm: Normal rate and regular rhythm.     Heart sounds: Normal heart sounds, S1 normal and S2 normal. No murmur. No gallop.   Pulmonary:     Effort: Pulmonary effort is normal. No accessory muscle usage or respiratory distress.     Breath sounds: Normal breath sounds.  Abdominal:     General: Bowel sounds are normal.     Palpations: Abdomen is soft.     Tenderness: There is no abdominal tenderness.  Musculoskeletal:        General: Normal range of motion.     Cervical back: Normal range of motion and neck supple.     Right lower leg: 2+ Edema present.     Left lower leg: 2+ Edema present.  Skin:    General: Skin is warm and dry.  Neurological:     Mental Status: He is alert and oriented to person, place, and time.  Psychiatric:        Attention and Perception: Attention normal.        Mood and Affect: Mood normal.        Behavior: Behavior is cooperative.     Comments: Slight grandiose thought processes present.  Very pleasant and cooperative.     Assessment & Plan:   Problem List Items Addressed This Visit      Cardiovascular and Mediastinum   Hypertension associated with diabetes ([place])    Chronic, ongoing.  BP at goal today.  Recommend checking these at home a few mornings a week and documenting.  Continue current medication regimen and adjust as needed.  Will place referral to cardiology per patient request.  Plan to obtain records from [facility] and will obtain baseline clinic labs next visit.  Return in 4 weeks for annual physical.      Relevant Medications   rivaroxaban (XARELTO) 20 MG TABS tablet   empagliflozin (JARDIANCE) 10 MG TABS tablet   Other Relevant Orders   AMB referral to CHF  clinic   Ambulatory referral to Cardiology   Referral to Chronic Care Management Services   CORONARY ATHEROSCLEROSIS, ARTERY BYPASS GRAFT    Ongoing, stable with no recent NTG use.  Will continue current medication regimen and adjust as needed.  Referral to cardiology.      Relevant Medications   rivaroxaban (XARELTO) 20 MG TABS tablet   Other Relevant Orders   Ambulatory referral to Cardiology   Referral to Chronic Care Management Services   Chronic diastolic CHF (congestive heart failure) (HCC)    Chronic, ongoing. Will get back into HF clinic, was lost to follow-up with them.  Referral placed.  Continue current medication regimen and adjust as needed.  Plan to obtain records from [place] and will obtain baseline clinic labs next visit.  Return in 4 weeks for annual physical.  Recommend: - Reminded to call for an overnight weight gain of >2 pounds or a weekly weight weight of >5 pounds - not adding salt to his food and has been reading food labels. Reviewed the importance of keeping daily sodium intake to 2000mg  daily       Relevant Medications   rivaroxaban (XARELTO) 20 MG TABS tablet  Other Relevant Orders   Ambulatory referral to Cardiology   Referral to Chronic Care Management Services   Chronic venous insufficiency    Has significant history of cellulitis treatment multiple times in past.  Recommend continued use of compression hose on in AM and off at HS.  If recurrent infection or ongoing circulation issues, refer to vascular.      Relevant Medications   rivaroxaban (XARELTO) 20 MG TABS tablet     Digestive   GERD (gastroesophageal reflux disease)    Chronic, stable.  Continue current medication regimen and adjust as needed.  Plan to obtain records from [place] and will obtain baseline clinic labs next visit.  Return in 4 weeks for annual physical.      Relevant Medications   lansoprazole (PREVACID) 15 MG capsule     Endocrine   Type 2 diabetes mellitus with hyperglycemia  (HCC) - Primary    Chronic, ongoing.  No recent A1C available.  Will continue current medication regimen at this time and adjust as needed.  Plan to obtain records from [place] and will obtain baseline clinic labs next visit.  Return in 4 weeks for annual physical.      Relevant Medications   empagliflozin (JARDIANCE) 10 MG TABS tablet   Other Relevant Orders   Referral to Chronic Care Management Services   Hyperlipidemia associated with type 2 diabetes mellitus ([place])    Chronic, ongoing.  Continue current medication regimen and adjust as needed.  Plan to obtain records from [place] and will obtain baseline clinic labs next visit.  Return in 4 weeks for annual physical.      Relevant Medications   empagliflozin (JARDIANCE) 10 MG TABS tablet     Other   Obesity, morbid (more than 100 lbs over ideal weight or BMI > 40) (HCC)    Recommend continued focus on healthy diet choices and regular physical activity (30 minutes 5 days a week).       Relevant Medications   empagliflozin (JARDIANCE) 10 MG TABS tablet   Anxiety    Chronic, ongoing with some grandiose thought processes present.  Continue current medication regimen and adjust as needed.  Will place CCM referral, would benefit from ongoing discussion of medication regimen and addition of pill packs to assist with medication adherence, at this time unsure he is taking entire medication regimen.      Relevant Medications   busPIRone (BUSPAR) 5 MG tablet      Outpatient Encounter Medications as of 08/11/2019  Medication Sig  . busPIRone (BUSPAR) 5 MG tablet Take 5 mg by mouth daily.  . cholecalciferol (VITAMIN D3) 25 MCG (1000 UT) tablet Take 1,000 Units by mouth daily.  . empagliflozin (JARDIANCE) 10 MG TABS tablet Take 10 mg by mouth daily.  . lansoprazole (PREVACID) 15 MG capsule Take 15 mg by mouth daily at 12 noon.  Marland Kitchen lisinopril (PRINIVIL,ZESTRIL) 5 MG tablet Take 5 mg by mouth daily.  . metoprolol succinate (TOPROL XL) 50 MG 24 hr  tablet Take by mouth.  . nitroGLYCERIN (NITROSTAT) 0.4 MG SL tablet Place 0.4 mg under the tongue every 5 (five) minutes as needed for chest pain.  Marland Kitchen risperiDONE (RISPERDAL) 0.5 MG tablet Take 0.5 mg by mouth 2 (two) times daily.  . rivaroxaban (XARELTO) 20 MG TABS tablet Take 20 mg by mouth at bedtime.  . rosuvastatin (CRESTOR) 20 MG tablet Take 40 mg by mouth at bedtime.   Marland Kitchen aspirin 81 MG EC tablet Take 81 mg by mouth 2 (  two) times daily.   . budesonide-formoterol (SYMBICORT) 80-4.5 MCG/ACT inhaler Inhale 2 puffs into the lungs 2 (two) times daily.  . calcium carbonate (OSCAL) 1500 (600 Ca) MG TABS tablet Take 1,500 mg by mouth 2 (two) times daily with a meal.  . dicyclomine (BENTYL) 10 MG capsule Take 10 mg by mouth 2 (two) times daily. AS NEEDED  . DULoxetine (CYMBALTA) 30 MG capsule Take 30 mg by mouth daily.  . fluticasone (FLONASE) 50 MCG/ACT nasal spray Place 2 sprays into both nostrils daily.   . furosemide (LASIX) 40 MG tablet 40 mg daily. Take 80 mg in the am with 40 mg in the pm.   . hydrOXYzine (ATARAX/VISTARIL) 25 MG tablet Take 25 mg by mouth at bedtime.  Marland Kitchen ibuprofen (ADVIL,MOTRIN) 600 MG tablet Take 600 mg by mouth every 6 (six) hours as needed.  Marland Kitchen ipratropium-albuterol (DUONEB) 0.5-2.5 (3) MG/3ML SOLN Take 3 mLs by nebulization every 4 (four) hours as needed.  . loratadine (CLARITIN) 10 MG tablet Take 10 mg by mouth daily.  . meclizine (ANTIVERT) 25 MG tablet Take 25 mg by mouth.  . phenazopyridine (PYRIDIUM) 200 MG tablet Take 1 tablet (200 mg total) by mouth 3 (three) times daily as needed for pain. (Patient not taking: Reported on 03/01/2018)  . predniSONE (DELTASONE) 20 MG tablet Take 20 mg by mouth 3 (three) times daily as needed.  Marland Kitchen PROAIR HFA 108 (90 BASE) MCG/ACT inhaler Inhale 1 puff into the lungs every 4 (four) hours as needed.   . ranitidine (ZANTAC) 150 MG capsule Take 150 mg by mouth 2 (two) times daily.  . sucralfate (CARAFATE) 1 g tablet Take 1 g by mouth 2 (two)  times daily.  . [DISCONTINUED] diazepam (VALIUM) 2 MG tablet Take 1 tablet (2 mg total) by mouth every 8 (eight) hours as needed for muscle spasms. (Patient not taking: Reported on 03/01/2018)  . [DISCONTINUED] esomeprazole (NEXIUM) 40 MG capsule Take 40 mg by mouth daily.   . [DISCONTINUED] metFORMIN (GLUCOPHAGE) 500 MG tablet Take 1 tablet (500 mg total) by mouth 2 (two) times daily with a meal. (Patient not taking: Reported on 08/11/2019)  . [DISCONTINUED] metoprolol tartrate (LOPRESSOR) 25 MG tablet Take 50 mg by mouth daily.   . [DISCONTINUED] pantoprazole (PROTONIX) 40 MG tablet Take 40 mg by mouth 2 (two) times daily.   . [DISCONTINUED] potassium chloride (K-DUR) 10 MEQ tablet Take 1 tablet (10 mEq total) by mouth daily. (Patient not taking: Reported on 08/11/2019)   No facility-administered encounter medications on file as of 08/11/2019.    Follow-up: Return in about 4 weeks (around 09/08/2019) for Annual Physical (need past records from [facility]).   [name], [name]

## 2019-08-11 NOTE — Assessment & Plan Note (Signed)
Ongoing, stable with no recent NTG use.  Will continue current medication regimen and adjust as needed.  Referral to cardiology.

## 2019-08-11 NOTE — Assessment & Plan Note (Signed)
Chronic, ongoing. Will get back into HF clinic, was lost to follow-up with them.  Referral placed.  Continue current medication regimen and adjust as needed.  Plan to obtain records from [place] and will obtain baseline clinic labs next visit.  Return in 4 weeks for annual physical.  Recommend: - Reminded to call for an overnight weight gain of >2 pounds or a weekly weight weight of >5 pounds - not adding salt to his food and has been reading food labels. Reviewed the importance of keeping daily sodium intake to 2000mg  daily

## 2019-08-11 NOTE — Assessment & Plan Note (Signed)
Chronic, ongoing.  Continue current medication regimen and adjust as needed.  Plan to obtain records from [place] and will obtain baseline clinic labs next visit.  Return in 4 weeks for annual physical.

## 2019-08-11 NOTE — Patient Instructions (Signed)

## 2019-08-11 NOTE — Assessment & Plan Note (Signed)
Chronic, stable.  Continue current medication regimen and adjust as needed.  Plan to obtain records from [place] and will obtain baseline clinic labs next visit.  Return in 4 weeks for annual physical.

## 2019-08-12 ENCOUNTER — Telehealth: Payer: Self-pay | Admitting: Nurse Practitioner

## 2019-08-12 NOTE — Chronic Care Management (AMB) (Signed)
  Chronic Care Management   Note  08/12/2019 Name: ALERIC FROELICH MRN: [id number] DOB: Apr 27, 1956  [name] Deer is a 64 y.o. year old male who is a primary care patient of [name], [name], NP. I reached out to [employer] by phone today in response to a referral sent by Mr. [name] Heo's PCP, [name] NP     Mr. Latona was given information about Chronic Care Management services today including:  1. CCM service includes personalized support from designated clinical staff supervised by his physician, including individualized plan of care and coordination with other care providers 2. 24/7 contact phone numbers for assistance for urgent and routine care needs. 3. Service will only be billed when office clinical staff spend 20 minutes or more in a month to coordinate care. 4. Only one practitioner may furnish and bill the service in a calendar month. 5. The patient may stop CCM services at any time (effective at the end of the month) by phone call to the office staff. 6. The patient will be responsible for cost sharing (co-pay) of up to 20% of the service fee (after annual deductible is met).  Patient agreed to services and verbal consent obtained.   Follow up plan: Telephone appointment with care management team member scheduled for:09/12/2019  Glenna Durand, [facility] ??[email or web address] ??[phone]

## 2019-08-18 ENCOUNTER — Telehealth: Payer: Self-pay | Admitting: Nurse Practitioner

## 2019-08-18 NOTE — Telephone Encounter (Signed)
Copied from [place] [id number]. Topic: General - Other >> Aug 18, 2019  2:13 PM Keene Breath wrote: Reason for CRM: Called to confirm that a fax was received regarding his medical records.  Please call to confirm receipt of fax.  [phone]

## 2019-08-19 NOTE — Telephone Encounter (Signed)
Records received and given to [facility].

## 2019-08-20 ENCOUNTER — Ambulatory Visit: Payer: Medicare Other | Attending: Family | Admitting: Family

## 2019-08-20 ENCOUNTER — Other Ambulatory Visit: Payer: Self-pay

## 2019-08-20 ENCOUNTER — Encounter: Payer: Self-pay | Admitting: Family

## 2019-08-20 VITALS — BP 152/82 | HR 71 | Resp 18 | Ht 61.0 in | Wt 244.8 lb

## 2019-08-20 DIAGNOSIS — Z9841 Cataract extraction status, right eye: Secondary | ICD-10-CM | POA: Insufficient documentation

## 2019-08-20 DIAGNOSIS — G4733 Obstructive sleep apnea (adult) (pediatric): Secondary | ICD-10-CM | POA: Insufficient documentation

## 2019-08-20 DIAGNOSIS — Z79899 Other long term (current) drug therapy: Secondary | ICD-10-CM | POA: Insufficient documentation

## 2019-08-20 DIAGNOSIS — Z880 Allergy status to penicillin: Secondary | ICD-10-CM | POA: Diagnosis not present

## 2019-08-20 DIAGNOSIS — G8929 Other chronic pain: Secondary | ICD-10-CM | POA: Insufficient documentation

## 2019-08-20 DIAGNOSIS — F419 Anxiety disorder, unspecified: Secondary | ICD-10-CM

## 2019-08-20 DIAGNOSIS — Z7984 Long term (current) use of oral hypoglycemic drugs: Secondary | ICD-10-CM | POA: Insufficient documentation

## 2019-08-20 DIAGNOSIS — Z888 Allergy status to other drugs, medicaments and biological substances status: Secondary | ICD-10-CM | POA: Diagnosis not present

## 2019-08-20 DIAGNOSIS — I110 Hypertensive heart disease with heart failure: Secondary | ICD-10-CM | POA: Diagnosis not present

## 2019-08-20 DIAGNOSIS — Z7982 Long term (current) use of aspirin: Secondary | ICD-10-CM | POA: Insufficient documentation

## 2019-08-20 DIAGNOSIS — Z6841 Body Mass Index (BMI) 40.0 and over, adult: Secondary | ICD-10-CM | POA: Insufficient documentation

## 2019-08-20 DIAGNOSIS — E11319 Type 2 diabetes mellitus with unspecified diabetic retinopathy without macular edema: Secondary | ICD-10-CM | POA: Diagnosis not present

## 2019-08-20 DIAGNOSIS — I5032 Chronic diastolic (congestive) heart failure: Secondary | ICD-10-CM | POA: Diagnosis not present

## 2019-08-20 DIAGNOSIS — Z9842 Cataract extraction status, left eye: Secondary | ICD-10-CM | POA: Insufficient documentation

## 2019-08-20 DIAGNOSIS — E119 Type 2 diabetes mellitus without complications: Secondary | ICD-10-CM | POA: Diagnosis not present

## 2019-08-20 DIAGNOSIS — E785 Hyperlipidemia, unspecified: Secondary | ICD-10-CM | POA: Insufficient documentation

## 2019-08-20 DIAGNOSIS — J449 Chronic obstructive pulmonary disease, unspecified: Secondary | ICD-10-CM | POA: Insufficient documentation

## 2019-08-20 DIAGNOSIS — Z951 Presence of aortocoronary bypass graft: Secondary | ICD-10-CM | POA: Diagnosis not present

## 2019-08-20 DIAGNOSIS — I10 Essential (primary) hypertension: Secondary | ICD-10-CM

## 2019-08-20 DIAGNOSIS — K219 Gastro-esophageal reflux disease without esophagitis: Secondary | ICD-10-CM | POA: Insufficient documentation

## 2019-08-20 DIAGNOSIS — Z7951 Long term (current) use of inhaled steroids: Secondary | ICD-10-CM | POA: Insufficient documentation

## 2019-08-20 DIAGNOSIS — I872 Venous insufficiency (chronic) (peripheral): Secondary | ICD-10-CM | POA: Insufficient documentation

## 2019-08-20 DIAGNOSIS — Z8249 Family history of ischemic heart disease and other diseases of the circulatory system: Secondary | ICD-10-CM | POA: Insufficient documentation

## 2019-08-20 DIAGNOSIS — F410 Panic disorder [episodic paroxysmal anxiety] without agoraphobia: Secondary | ICD-10-CM | POA: Insufficient documentation

## 2019-08-20 DIAGNOSIS — Z7901 Long term (current) use of anticoagulants: Secondary | ICD-10-CM | POA: Diagnosis not present

## 2019-08-20 DIAGNOSIS — E669 Obesity, unspecified: Secondary | ICD-10-CM | POA: Insufficient documentation

## 2019-08-20 DIAGNOSIS — I25810 Atherosclerosis of coronary artery bypass graft(s) without angina pectoris: Secondary | ICD-10-CM | POA: Diagnosis not present

## 2019-08-20 DIAGNOSIS — Z8349 Family history of other endocrine, nutritional and metabolic diseases: Secondary | ICD-10-CM | POA: Insufficient documentation

## 2019-08-20 DIAGNOSIS — Z955 Presence of coronary angioplasty implant and graft: Secondary | ICD-10-CM | POA: Diagnosis not present

## 2019-08-20 DIAGNOSIS — Z882 Allergy status to sulfonamides status: Secondary | ICD-10-CM | POA: Insufficient documentation

## 2019-08-20 DIAGNOSIS — I252 Old myocardial infarction: Secondary | ICD-10-CM | POA: Diagnosis not present

## 2019-08-20 LAB — GLUCOSE, CAPILLARY: Glucose-Capillary: 113 mg/dL — ABNORMAL HIGH (ref 70–99)

## 2019-08-20 NOTE — Patient Instructions (Signed)
Continue weighing daily and call for an overnight weight gain of > 2 pounds or a weekly weight gain of >5 pounds. 

## 2019-08-20 NOTE — Progress Notes (Signed)
Patient ID: Evan [name], male    DOB: 07-17-1956, 64 y.o.   MRN: [id number]  HPI  Evan [name] is a 64 y/o male with a history of asthma, CAD, DM, hyperlipidemia, HTN, GERD, obstructive sleep apnea, COPD, MI, anxiety, panic disorder, PTSA and chronic heart failure.   Echo done 12/29/13 which showed an EF of 50-55%.   Has not been admitted or been in the ED in the last 6 months.   He presents today for a follow-up visit at the request of his PCP although hasn't been seen since 2016. He has a chief complaint of minimal fatigue upon moderate exertion. He describes this as chronic in nature having been present for several years. He has associated pedal edema, palpitations, vomiting after eating, light-headedness, anxiety and difficulty sleeping along with this. He denies any abdominal distention, chest pain, shortness of breath, cough or weight gain.   Past Medical History:  Diagnosis Date  . Anginal pain ([place])   . Anxiety disorder   . Asthma   . Atresia of esophagus without fistula   . CAD (coronary artery disease)   . Cellulitis   . CHF (congestive heart failure) ([place])    NYHA CLASS III,CHRONIC,DIASTOLIC  . COPD (chronic obstructive pulmonary disease) ([place])   . Diabetes mellitus without complication ([place])   . Edema    RIGHT LOWER LEG  . Gastroesophageal reflux   . H/O: GI bleed   . History of pneumonia    Remote  . History of scarlet fever    Childhood  . Hyperlipidemia   . Hypertension   . Myocardial infarction ([place]) 2009  . Obesity   . Obstructive sleep apnea   . Pain    CHRONIC BACK / ABDOMINAL  . Panic disorder   . Peripheral venous insufficiency   . PTSD (post-traumatic stress disorder)   . Retinopathy    DIABETIC  . Stasis, venous   . Vertigo    Past Surgical History:  Procedure Laterality Date  . CARDIAC CATHETERIZATION    . CATARACT EXTRACTION Left   . CATARACT EXTRACTION W/PHACO Right 05/03/2017   Procedure: CATARACT EXTRACTION PHACO AND INTRAOCULAR LENS  PLACEMENT (IOC);  Surgeon: Leandrew Koyanagi, MD;  Location: [facility] ORS;  Service: Ophthalmology;  Laterality: Right;  [place] 00:35.3 AP% 12.3 CDE 4.33 Fluid Pack lot # [id number]       . CORONARY ANGIOPLASTY WITH STENT PLACEMENT  2002  . CORONARY ANGIOPLASTY WITH STENT PLACEMENT  1999  . CORONARY ARTERY BYPASS GRAFT     x7  . ESOPHAGOGASTRODUODENOSCOPY N/A 09/19/2016   Procedure: ESOPHAGOGASTRODUODENOSCOPY (EGD);  Surgeon: Lollie Sails, MD;  Location[facility] ENDOSCOPY;  Service: Endoscopy;  Laterality: N/A;   Family History  Problem Relation Age of Onset  . Heart attack Mother   . Hypertension Mother   . Hyperlipidemia Mother   . Heart attack Brother 19       MI  . Coronary artery disease Other    Social History   Tobacco Use  . Smoking status: Never Smoker  . Smokeless tobacco: Never Used  Substance Use Topics  . Alcohol use: No   Allergies  Allergen Reactions  . Penicillin G Hives  . Sulfa Antibiotics Hives  . Tiotropium   . Zoloft [Sertraline Hcl] Other (See Comments)   Prior to Admission medications   Medication Sig Start Date End Date Taking? Authorizing Provider  aspirin 81 MG EC tablet Take 81 mg by mouth 2 (two) times daily.    Yes [provider]  cholecalciferol (VITAMIN D3) 25 MCG (1000 UT) tablet Take 1,000 Units by mouth daily.   Yes [provider]  empagliflozin (JARDIANCE) 10 MG TABS tablet Take 10 mg by mouth daily.   Yes [provider]  fluticasone (FLONASE) 50 MCG/ACT nasal spray Place 2 sprays into both nostrils daily.  12/31/13  Yes [provider]  furosemide (LASIX) 40 MG tablet 40 mg daily. Take 80 mg in the am with 40 mg in the pm.    Yes [provider]  ibuprofen (ADVIL,MOTRIN) 600 MG tablet Take 600 mg by mouth every 6 (six) hours as needed.   Yes [provider]  ipratropium-albuterol (DUONEB) 0.5-2.5 (3) MG/3ML SOLN Take 3 mLs by nebulization every 4 (four) hours as needed.   Yes  [provider]  lisinopril (PRINIVIL,ZESTRIL) 5 MG tablet Take 10 mg by mouth daily.    Yes [provider]  loratadine (CLARITIN) 10 MG tablet Take 10 mg by mouth daily.   Yes [provider]  metoprolol succinate (TOPROL XL) 50 MG 24 hr tablet Take by mouth. 06/08/15  Yes [provider]  nitroGLYCERIN (NITROSTAT) 0.4 MG SL tablet Place 0.4 mg under the tongue every 5 (five) minutes as needed for chest pain.   Yes [provider]  PROAIR HFA 108 (90 BASE) MCG/ACT inhaler Inhale 1 puff into the lungs every 4 (four) hours as needed.  12/22/13  Yes [provider]  risperiDONE (RISPERDAL) 0.5 MG tablet Take 0.5 mg by mouth 2 (two) times daily.   Yes [provider]  rivaroxaban (XARELTO) 20 MG TABS tablet Take 20 mg by mouth at bedtime.   Yes [provider]  rosuvastatin (CRESTOR) 20 MG tablet Take 40 mg by mouth at bedtime.    Yes [provider]  budesonide-formoterol (SYMBICORT) 80-4.5 MCG/ACT inhaler Inhale 2 puffs into the lungs 2 (two) times daily.    [provider]  ranitidine (ZANTAC) 150 MG capsule Take 150 mg by mouth 2 (two) times daily.    [provider]    Review of Systems  Constitutional: Positive for fatigue. Negative for appetite change.  HENT: Positive for congestion and rhinorrhea. Negative for sore throat.   Eyes: Negative.   Respiratory: Negative for cough and shortness of breath.   Cardiovascular: Positive for palpitations and leg swelling. Negative for chest pain.  Gastrointestinal: Positive for vomiting (after eating). Negative for abdominal distention and abdominal pain.       Reflux  Endocrine: Negative.   Genitourinary: Negative.   Musculoskeletal: Positive for arthralgias (right knee pain).  Skin: Negative.   Allergic/Immunologic: Negative.   Neurological: Positive for light-headedness (with vertigo). Negative for dizziness.  Hematological: Negative for  adenopathy. Does not bruise/bleed easily.  Psychiatric/Behavioral: Positive for sleep disturbance (not sleeping well; sleeping on 1 pillow and wedge). Negative for dysphoric mood. The patient is nervous/anxious.     Vitals:   08/20/19 1026  BP: (!) 152/82  Pulse: 71  Resp: 18  SpO2: 97%  Weight: 244 lb 12.8 oz (111 kg)  Height: 5\' 1"  (1.549 m)   Wt Readings from Last 3 Encounters:  08/20/19 244 lb 12.8 oz (111 kg)  08/11/19 248 lb 3.2 oz (112.6 kg)  02/05/19 240 lb 3.1 oz (109 kg)   Lab Results  Component Value Date   CREATININE 0.90 05/22/2019   CREATININE 0.93 08/08/2018   CREATININE 0.78 12/31/2017    Physical Exam Vitals and nursing note reviewed.  Constitutional:  Appearance: Normal appearance.  HENT:     Head: Normocephalic and atraumatic.  Cardiovascular:     Rate and Rhythm: Normal rate and regular rhythm.  Pulmonary:     Effort: Pulmonary effort is normal. No respiratory distress.     Breath sounds: No wheezing or rales.  Abdominal:     General: There is no distension.     Palpations: Abdomen is soft.     Tenderness: There is no abdominal tenderness.  Musculoskeletal:        General: No tenderness.     Cervical back: Normal range of motion and neck supple.     Right lower leg: No edema.     Left lower leg: No edema.  Skin:    General: Skin is warm and dry.  Neurological:     General: No focal deficit present.     Mental Status: He is alert and oriented to person, place, and time.  Psychiatric:        Mood and Affect: Mood is anxious.        Behavior: Behavior normal.     Assessment & Plan:  1: Chronic heart failure with preserved ejection fraction- - NYHA class II - euvolemic today - weighing daily and says that his weight has been stable; reminded to call for an overnight weight gain of >2 pounds or a weekly weight gain of >5 pounds - not adding salt and trying to follow a low sodium diet - was being followed by [facility] but has left that  program; saw Dr. [name] in the past and requests to return back to him; appt was scheduled for 09/16/19 - echo scheduled for 08/28/19 - BNP 04/01/15 was 282.0 - patient reports receiving his flu and shingles vaccine  2: HTN- - BP mildly elevated today - saw PCP Ned Card) 08/11/19 - BMP 08/08/2018 reviewed and showed sodium 137, potassium 3.5, creatinine 0.93 and GFR >60 (has appt 09/09/19 for physical and lab work)  3: DM- - last A1c was in 2012 - glucose in clinic today was 113  4: Anxiety- - patient anxious about coming today and getting re-established with [place] and Dr. [name] - is upset at [facility] because they said he was delusional in regards to his [employer]/ [place] contact claims  Medication bottles were brought but he says that he's taking OTC medications that he didn't bring and isn't sure of his medications completely  Return in 1 month or sooner for any questions/problems before then.

## 2019-08-28 ENCOUNTER — Ambulatory Visit
Admission: RE | Admit: 2019-08-28 | Discharge: 2019-08-28 | Disposition: A | Discharge status: Home / Self Care | Payer: Medicare Other | Source: Ambulatory Visit | Attending: Family | Admitting: Family

## 2019-08-28 ENCOUNTER — Telehealth: Payer: Self-pay | Admitting: Nurse Practitioner

## 2019-08-28 ENCOUNTER — Other Ambulatory Visit: Payer: Self-pay

## 2019-08-28 DIAGNOSIS — I110 Hypertensive heart disease with heart failure: Secondary | ICD-10-CM | POA: Diagnosis not present

## 2019-08-28 DIAGNOSIS — J449 Chronic obstructive pulmonary disease, unspecified: Secondary | ICD-10-CM | POA: Insufficient documentation

## 2019-08-28 DIAGNOSIS — G4730 Sleep apnea, unspecified: Secondary | ICD-10-CM | POA: Insufficient documentation

## 2019-08-28 DIAGNOSIS — I5032 Chronic diastolic (congestive) heart failure: Secondary | ICD-10-CM | POA: Insufficient documentation

## 2019-08-28 DIAGNOSIS — E119 Type 2 diabetes mellitus without complications: Secondary | ICD-10-CM | POA: Insufficient documentation

## 2019-08-28 NOTE — Telephone Encounter (Signed)
     Recvd msg from [facility] regarding transportation, called pt regarding [employer] Referral for transportation. [employer] was set up by a Dietitian (pt is not sure who did that for him at the hospital perhaps SW) and was confused about the message he received regarding pick up at 10:15 today for his 11am echocardiogram appt. Confirmed with pt that if he received msg that [employer] will be there to pick up this morning.  Gave him the contact information to call back # should he have further questions. He said he will be able to set up transportation for future visits to [facility] and other specialists. [facility] National: [phone]. Call w/ questions after appt made [phone]  Pt also asked about resource for another walker and gave him the information for [employer] [facility] . Embedded Care Coordination [place]  Care Management ??[email or web address]  ??[phone]    Copied from [place] [id number]. Topic: General - Inquiry >> Aug 28, 2019  8:35 AM Richardo Priest, NT wrote: Reason for CRM: Pt called in stating he is needing a car for his appointment today at 11:00. Please advise.

## 2019-08-28 NOTE — Progress Notes (Signed)
*  PRELIMINARY RESULTS* Echocardiogram 2D Echocardiogram has been performed.  Sherrie Sport 08/28/2019, 12:02 PM

## 2019-08-28 NOTE — Telephone Encounter (Signed)
Pt would like a c/b regarding acid reflux that is causing him a lot of discomfort. Please call [phone] Thank you, Jill Alexanders

## 2019-08-28 NOTE — Telephone Encounter (Signed)
Called patient. LVM for patient to return call to the office.

## 2019-08-29 ENCOUNTER — Telehealth: Payer: Self-pay | Admitting: Nurse Practitioner

## 2019-08-29 NOTE — Telephone Encounter (Signed)
LVM for patient to return my call 

## 2019-08-29 NOTE — Telephone Encounter (Signed)
Patient is calling for assistance with letter he received regarding his medical transportation being denied. Patient states his missed an appt with his transportation because he could not talk due to acid reflux. Please advise Cb- [phone]

## 2019-08-29 NOTE — Telephone Encounter (Signed)
Copied from [place] [id number]. Topic: General - Other >> Aug 28, 2019  2:58 PM Yvette Rack wrote: Reason for CRM: Pt called to request a Rx for a walker to be sent to fax# [phone]. Pt stated he also would like to provide some good news about his appt today. Pt requests call back.

## 2019-09-01 ENCOUNTER — Ambulatory Visit: Payer: Self-pay | Admitting: General Practice

## 2019-09-01 ENCOUNTER — Telehealth: Payer: Self-pay | Admitting: Nurse Practitioner

## 2019-09-01 ENCOUNTER — Encounter: Payer: Self-pay | Admitting: *Deleted

## 2019-09-01 DIAGNOSIS — E1159 Type 2 diabetes mellitus with other circulatory complications: Secondary | ICD-10-CM

## 2019-09-01 DIAGNOSIS — I5032 Chronic diastolic (congestive) heart failure: Secondary | ICD-10-CM

## 2019-09-01 NOTE — Chronic Care Management (AMB) (Signed)
°  Chronic Care Management   Note  09/01/2019 Name: TRIGO BALAKRISHNAN MRN: [id number] DOB: [birth year]  Care guide referral placed for assistance with the patient's transportation needs. Will follow up for other needs on next outreach.   Follow up plan: The care management team will reach out to the patient again over the next 30 days.   Noreene Larsson RN, MSN, [facility] Mobile: [phone]

## 2019-09-01 NOTE — Telephone Encounter (Signed)
Gave pt my direct # to call for scheduling transportation for future appts.  knb

## 2019-09-01 NOTE — Telephone Encounter (Signed)
Noted, will discuss at upcoming appointment, if worsening then be seen sooner.

## 2019-09-01 NOTE — Telephone Encounter (Signed)
Walker order placed in signature folder

## 2019-09-01 NOTE — Telephone Encounter (Signed)
Will sign, thank you.

## 2019-09-01 NOTE — Telephone Encounter (Signed)
    Called pt regarding [employer] Referral for transportation to dr. Hilaria Ota. Pt nees help setting up appt for 09/09/19  [place] . Embedded Care Coordination [place]  Care Management ??[email or web address]  ??[phone]

## 2019-09-01 NOTE — Telephone Encounter (Signed)
Patient will discuss at his upcoming appt.

## 2019-09-01 NOTE — Telephone Encounter (Signed)
    Called pt to give details for trip Feb 2nd Pick up 9:45am - 10am  Pick up from [facility] 11:15am to return home [employer] [facility] [place] . Embedded Care Coordination [place]  Care Management ??[email or web address]  ??[phone]

## 2019-09-09 ENCOUNTER — Encounter: Payer: Self-pay | Admitting: Nurse Practitioner

## 2019-09-09 ENCOUNTER — Ambulatory Visit (INDEPENDENT_AMBULATORY_CARE_PROVIDER_SITE_OTHER): Payer: Medicare Other | Admitting: Nurse Practitioner

## 2019-09-09 ENCOUNTER — Other Ambulatory Visit: Payer: Self-pay

## 2019-09-09 VITALS — BP 136/82 | HR 109 | Temp 98.9°F | Ht 62.5 in | Wt 238.6 lb

## 2019-09-09 DIAGNOSIS — E1159 Type 2 diabetes mellitus with other circulatory complications: Secondary | ICD-10-CM | POA: Diagnosis not present

## 2019-09-09 DIAGNOSIS — E559 Vitamin D deficiency, unspecified: Secondary | ICD-10-CM

## 2019-09-09 DIAGNOSIS — Z8546 Personal history of malignant neoplasm of prostate: Secondary | ICD-10-CM

## 2019-09-09 DIAGNOSIS — I10 Essential (primary) hypertension: Secondary | ICD-10-CM

## 2019-09-09 DIAGNOSIS — Z0000 Encounter for general adult medical examination without abnormal findings: Secondary | ICD-10-CM | POA: Diagnosis not present

## 2019-09-09 DIAGNOSIS — G4733 Obstructive sleep apnea (adult) (pediatric): Secondary | ICD-10-CM

## 2019-09-09 DIAGNOSIS — Z9989 Dependence on other enabling machines and devices: Secondary | ICD-10-CM

## 2019-09-09 DIAGNOSIS — E1169 Type 2 diabetes mellitus with other specified complication: Secondary | ICD-10-CM | POA: Diagnosis not present

## 2019-09-09 DIAGNOSIS — I5032 Chronic diastolic (congestive) heart failure: Secondary | ICD-10-CM

## 2019-09-09 DIAGNOSIS — E785 Hyperlipidemia, unspecified: Secondary | ICD-10-CM

## 2019-09-09 DIAGNOSIS — E1129 Type 2 diabetes mellitus with other diabetic kidney complication: Secondary | ICD-10-CM | POA: Diagnosis not present

## 2019-09-09 DIAGNOSIS — I872 Venous insufficiency (chronic) (peripheral): Secondary | ICD-10-CM

## 2019-09-09 DIAGNOSIS — R809 Proteinuria, unspecified: Secondary | ICD-10-CM

## 2019-09-09 DIAGNOSIS — K219 Gastro-esophageal reflux disease without esophagitis: Secondary | ICD-10-CM

## 2019-09-09 DIAGNOSIS — M810 Age-related osteoporosis without current pathological fracture: Secondary | ICD-10-CM

## 2019-09-09 DIAGNOSIS — F419 Anxiety disorder, unspecified: Secondary | ICD-10-CM

## 2019-09-09 LAB — MICROALBUMIN, URINE WAIVED
Creatinine, Urine Waived: 200 mg/dL (ref 10–300)
Microalb, Ur Waived: 80 mg/L — ABNORMAL HIGH (ref 0–19)

## 2019-09-09 LAB — BAYER DCA HB A1C WAIVED: HB A1C (BAYER DCA - WAIVED): 6.6 % (ref <7.0)

## 2019-09-09 MED ORDER — LISINOPRIL 5 MG PO TABS: 10.0000 mg | ORAL_TABLET | Freq: Every day | ORAL | 3 refills | Status: DC

## 2019-09-09 MED ORDER — EUCERIN EX LOTN: TOPICAL_LOTION | CUTANEOUS | 2 refills | Status: DC | PRN

## 2019-09-09 MED ORDER — OMEPRAZOLE 20 MG PO CPDR: 20.0000 mg | DELAYED_RELEASE_CAPSULE | Freq: Every day | ORAL | 3 refills | Status: DC

## 2019-09-09 MED ORDER — FUROSEMIDE 40 MG PO TABS: 40.0000 mg | ORAL_TABLET | Freq: Every day | ORAL | 3 refills | Status: DC

## 2019-09-09 MED ORDER — NITROGLYCERIN 0.4 MG SL SUBL: 0.4000 mg | SUBLINGUAL_TABLET | SUBLINGUAL | 1 refills | Status: DC | PRN

## 2019-09-09 MED ORDER — RISPERIDONE 0.5 MG PO TABS: 0.5000 mg | ORAL_TABLET | Freq: Two times a day (BID) | ORAL | 3 refills | Status: DC

## 2019-09-09 MED ORDER — METOPROLOL SUCCINATE ER 50 MG PO TB24: 50.0000 mg | ORAL_TABLET | Freq: Every day | ORAL | 3 refills | Status: DC

## 2019-09-09 MED ORDER — VITAMIN D 25 MCG (1000 UNIT) PO TABS: 1000.0000 [IU] | ORAL_TABLET | Freq: Every day | ORAL | 3 refills | Status: DC

## 2019-09-09 MED ORDER — JARDIANCE 10 MG PO TABS: 10.0000 mg | ORAL_TABLET | Freq: Every day | ORAL | 3 refills | Status: DC

## 2019-09-09 MED ORDER — RIVAROXABAN 20 MG PO TABS: 20.0000 mg | ORAL_TABLET | Freq: Every day | ORAL | 3 refills | Status: DC

## 2019-09-09 NOTE — Assessment & Plan Note (Addendum)
Chronic, ongoing.  BP initially elevated, but repeat improved.  Recommend checking these at home a few mornings a week and documenting.  Continue current medication regimen and adjust as needed + collaboration with cardiology.  Plan to obtain records from [place] and will obtain baseline clinic labs today.  Return in 3 months.

## 2019-09-09 NOTE — Patient Instructions (Signed)

## 2019-09-09 NOTE — Assessment & Plan Note (Signed)
Recommend 100% use of CPAP for overall health benefit.

## 2019-09-09 NOTE — Assessment & Plan Note (Signed)
Chronic, ongoing with poor control.  Will trial Prilosec 20 MG daily.  Recommend heavy focus on diet and avoiding foods that agitate symptoms. Keep food diary.  Return to office in 3 months or sooner if poor control.  Plan on short period PPI and will trial off in future.  Refuses referral for colonoscopy today.

## 2019-09-09 NOTE — Progress Notes (Addendum)
BP 136/82 (BP Location: Left Arm, Patient Position: Sitting)   Pulse (!) 109   Temp 98.9 F (37.2 C) (Oral)   Ht 5' 2.5" (1.588 m)   Wt 238 lb 9.6 oz (108.2 kg)   SpO2 96%   BMI 42.95 kg/m    Subjective:    Patient ID: Evan [name], male    DOB: 14-Dec-1955, 64 y.o.   MRN: [id number]  HPI: FREAD ACKROYD is a 64 y.o. male presenting on 09/09/2019 for comprehensive medical examination. Current medical complaints include:none  He currently lives with: self Interim Problems from his last visit: no   DIABETES No recent A1C noted, continues on Jardiance 10 MG daily.  A1C today 6.6% Hypoglycemic episodes:no Polydipsia/polyuria: no Visual disturbance: no Chest pain: no Paresthesias: no Glucose Monitoring: no  Accucheck frequency: Not Checking  Fasting glucose:  Post prandial:  Evening:  Before meals: Taking Insulin?: no  Long acting insulin:  Short acting insulin: Blood Pressure Monitoring: not checking Retinal Examination: Up to Date Foot Exam: Up to Date Pneumovax: unknown Influenza: unknown Aspirin: yes   HYPERTENSION / HYPERLIPIDEMIA Saw HF Clinic on 08/20/2019, no changes made.  Is scheduled upcoming to see cardiology.  Recent EF 08/28/2019 was 50 to 55%.  Uses CPAP at home about 70% of the time. Satisfied with current treatment? yes Duration of hypertension: chronic BP monitoring frequency: not checking BP range:  BP medication side effects: no Duration of hyperlipidemia: chronic Cholesterol medication side effects: no Cholesterol supplements: none Medication compliance: good compliance Aspirin: yes Recent stressors: no Recurrent headaches: no Visual changes: no Palpitations: no Dyspnea: no Chest pain: no Lower extremity edema: no Dizzy/lightheaded: no   GERD Has been followed by GI in past for dysphagia, last visit with EGD November 2017.  He reports Zantac stopped working and has not taken in some time.  Continues to have ongoing reflux. GERD  control status: uncontrolled  Satisfied with current treatment? no Heartburn frequency: 1-2 times a day Medication side effects: no  Medication compliance: stable Previous GERD medications: Zantac Antacid use frequency:  1-2 times a day Duration: since 2017 Nature: burning Location: epigastric Heartburn duration: 1 hour Alleviatiating factors:  TUMS sometimes Aggravating factors: certain foods Dysphagia: no Odynophagia:  no Hematemesis: no Blood in stool: no EGD: yes   ANXIETY/STRESS Continues on Seroquel for mood, which he reports is helpful.  He discussed today his friendship with [name] and showed provider a recent drawing he sent to [facility], reporting they are friends. Duration:stable Anxious mood: no  Excessive worrying: no Irritability: no  Sweating: no Nausea: no Palpitations:no Hyperventilation: no Panic attacks: no Agoraphobia: no  Obscessions/compulsions: yes Depressed mood: no Depression screen [facility] 2/9 09/09/2019 08/11/2019 03/01/2018 04/13/2015  Decreased Interest 0 0 0 1  Down, Depressed, Hopeless 0 0 0 0  PHQ - 2 Score 0 0 0 1  Altered sleeping 0 0 - -  Tired, decreased energy 0 0 - -  Change in appetite 0 0 - -  Feeling bad or failure about yourself  0 0 - -  Trouble concentrating 0 0 - -  Moving slowly or fidgety/restless 0 0 - -  Suicidal thoughts 0 0 - -  PHQ-9 Score 0 0 - -  Difficult doing work/chores Not difficult at all - - -   Anhedonia: no Weight changes: no Insomnia: none Hypersomnia: no Fatigue/loss of energy: no Feelings of worthlessness: no Feelings of guilt: no Impaired concentration/indecisiveness: no Suicidal ideations: no  Crying spells: no  Recent Stressors/Life Changes: no   Relationship problems: no   Family stress: no     Financial stress: no    Job stress: no    Recent death/loss: no  OSTEOPOROSIS Noted on DEXA October 2020 with T -3.1.  No current medications, does take daily vitamin D and calcium.  Uses  walker at baseline to prevent falls and assist mobility.  His is getting old and falling apart, would like new walker.  No recent falls.   Satisfied with current treatment?: yes Medication compliance: good compliance Past osteoporosis medications/treatments: none Adequate calcium & vitamin D: yes Intolerance to bisphosphonates:has not taken Weight bearing exercises: yes   PROSTATE CANCER: Followed by urology & oncology with past radiation treatment.  Last visit with oncology in July 2020.  Last urology visit February 2019.  Functional Status Survey: Is the patient deaf or have difficulty hearing?: No Does the patient have difficulty seeing, even when wearing glasses/contacts?: Yes Does the patient have difficulty concentrating, remembering, or making decisions?: No Does the patient have difficulty walking or climbing stairs?: Yes Does the patient have difficulty dressing or bathing?: Yes Does the patient have difficulty doing errands alone such as visiting a doctor's office or shopping?: No  FALL RISK: Fall Risk  10/01/2019 08/20/2019 08/11/2019 03/01/2018 04/13/2015  Falls in the past year? 0 0 0 No No  Number falls in past yr: 0 0 0 - -  Injury with Fall? 0 0 0 - -  Risk for fall due to : - Impaired balance/gait - - -  Follow up Falls evaluation completed Falls evaluation completed - - -   Advanced Directives <no information>  Past Medical History:  Past Medical History:  Diagnosis Date  . Anginal pain ([place])   . Anxiety disorder   . Asthma   . Atresia of esophagus without fistula   . CAD (coronary artery disease)   . Cellulitis   . CHF (congestive heart failure) ([place])    NYHA CLASS III,CHRONIC,DIASTOLIC  . COPD (chronic obstructive pulmonary disease) ([place])   . Diabetes mellitus without complication ([place])   . Edema    RIGHT LOWER LEG  . Gastroesophageal reflux   . H/O: GI bleed   . History of pneumonia    Remote  . History of scarlet fever    Childhood  . Hyperlipidemia    . Hypertension   . Myocardial infarction ([place]) 2009  . Obesity   . Obstructive sleep apnea   . Pain    CHRONIC BACK / ABDOMINAL  . Panic disorder   . Peripheral venous insufficiency   . PTSD (post-traumatic stress disorder)   . Retinopathy    DIABETIC  . Stasis, venous   . Vertigo     Surgical History:  Past Surgical History:  Procedure Laterality Date  . CARDIAC CATHETERIZATION    . CATARACT EXTRACTION Left   . CATARACT EXTRACTION W/PHACO Right 05/03/2017   Procedure: CATARACT EXTRACTION PHACO AND INTRAOCULAR LENS PLACEMENT (IOC);  Surgeon: Leandrew Koyanagi, MD;  Location: [facility] ORS;  Service: Ophthalmology;  Laterality: Right;  [place] 00:35.3 AP% 12.3 CDE 4.33 Fluid Pack lot # [id number]       . CORONARY ANGIOPLASTY WITH STENT PLACEMENT  2002  . CORONARY ANGIOPLASTY WITH STENT PLACEMENT  1999  . CORONARY ARTERY BYPASS GRAFT     x7  . ESOPHAGOGASTRODUODENOSCOPY N/A 09/19/2016   Procedure: ESOPHAGOGASTRODUODENOSCOPY (EGD);  Surgeon: Lollie Sails, MD;  Location: [facility] ENDOSCOPY;  Service: Endoscopy;  Laterality: N/A;    Medications:  Current Outpatient Medications on File Prior to Visit  Medication Sig  . aspirin 81 MG EC tablet Take 81 mg by mouth daily.   . budesonide-formoterol (SYMBICORT) 80-4.5 MCG/ACT inhaler Inhale 2 puffs into the lungs 2 (two) times daily.  . fluticasone (FLONASE) 50 MCG/ACT nasal spray Place 2 sprays into both nostrils daily.   Marland Kitchen ipratropium-albuterol (DUONEB) 0.5-2.5 (3) MG/3ML SOLN Take 3 mLs by nebulization every 4 (four) hours as needed.  Marland Kitchen PROAIR HFA 108 (90 BASE) MCG/ACT inhaler Inhale 1 puff into the lungs every 4 (four) hours as needed.    No current facility-administered medications on file prior to visit.    Allergies:  Allergies  Allergen Reactions  . Penicillin G Hives  . Sulfa Antibiotics Hives  . Tiotropium   . Zoloft [Sertraline Hcl] Other (See Comments)    Social History:  Social History   Socioeconomic History   . Marital status: Single    Spouse name: Not on file  . Number of children: Not on file  . Years of education: Not on file  . Highest education level: Not on file  Occupational History    Comment: disabled  Tobacco Use  . Smoking status: Never Smoker  . Smokeless tobacco: Never Used  Substance and Sexual Activity  . Alcohol use: No  . Drug use: No  . Sexual activity: Not Currently  Other Topics Concern  . Not on file  Social History Narrative   Disabled   Single   Social Determinants of Health   Financial Resource Strain:   . Difficulty of Paying Living Expenses: Not on file  Food Insecurity:   . Worried About Charity fundraiser in the Last Year: Not on file  . Ran Out of Food in the Last Year: Not on file  Transportation Needs:   . Lack of Transportation (Medical): Not on file  . Lack of Transportation (Non-Medical): Not on file  Physical Activity:   . Days of Exercise per Week: Not on file  . Minutes of Exercise per Session: Not on file  Stress:   . Feeling of Stress : Not on file  Social Connections:   . Frequency of Communication with Friends and Family: Not on file  . Frequency of Social Gatherings with Friends and Family: Not on file  . Attends Religious Services: Not on file  . Active Member of Clubs or Organizations: Not on file  . Attends Archivist Meetings: Not on file  . Marital Status: Not on file  Intimate Partner Violence:   . Fear of Current or Ex-Partner: Not on file  . Emotionally Abused: Not on file  . Physically Abused: Not on file  . Sexually Abused: Not on file   Social History   Tobacco Use  Smoking Status Never Smoker  Smokeless Tobacco Never Used   Social History   Substance and Sexual Activity  Alcohol Use No    Family History:  Family History  Problem Relation Age of Onset  . Heart attack Mother   . Hypertension Mother   . Hyperlipidemia Mother   . Heart attack Brother 73       MI  . Coronary artery disease  Other     Past medical history, surgical history, medications, allergies, family history and social history reviewed with patient today and changes made to appropriate areas of the chart.   Review of Systems - negative All other ROS negative except what is listed above and in the HPI.  Objective:    BP 136/82 (BP Location: Left Arm, Patient Position: Sitting)   Pulse (!) 109   Temp 98.9 F (37.2 C) (Oral)   Ht 5' 2.5" (1.588 m)   Wt 238 lb 9.6 oz (108.2 kg)   SpO2 96%   BMI 42.95 kg/m   Wt Readings from Last 3 Encounters:  10/01/19 236 lb 12.8 oz (107.4 kg)  09/16/19 235 lb 4 oz (106.7 kg)  09/09/19 238 lb 9.6 oz (108.2 kg)    Physical Exam Vitals and nursing note reviewed.  Constitutional:      General: He is awake. He is not in acute distress.    Appearance: He is well-developed. He is morbidly obese. He is not ill-appearing.  HENT:     Head: Normocephalic and atraumatic.     Right Ear: Hearing, tympanic membrane, ear canal and external ear normal. No drainage.     Left Ear: Hearing, tympanic membrane, ear canal and external ear normal. No drainage.     Nose: Nose normal.     Mouth/Throat:     Pharynx: Uvula midline.  Eyes:     General: Lids are normal.        Right eye: No discharge.        Left eye: No discharge.     Extraocular Movements: Extraocular movements intact.     Conjunctiva/sclera: Conjunctivae normal.     Pupils: Pupils are equal, round, and reactive to light.     Visual Fields: Right eye visual fields normal and left eye visual fields normal.  Neck:     Thyroid: No thyromegaly.     Vascular: No carotid bruit or JVD.     Trachea: Trachea normal.  Cardiovascular:     Rate and Rhythm: Normal rate and regular rhythm.     Heart sounds: Normal heart sounds, S1 normal and S2 normal. No murmur. No gallop.   Pulmonary:     Effort: Pulmonary effort is normal. No accessory muscle usage or respiratory distress.     Breath sounds: Normal breath sounds.   Abdominal:     General: Bowel sounds are normal.     Palpations: Abdomen is soft. There is no hepatomegaly or splenomegaly.     Tenderness: There is no abdominal tenderness.  Musculoskeletal:        General: Normal range of motion.     Cervical back: Normal range of motion and neck supple.     Right lower leg: No edema.     Left lower leg: No edema.  Lymphadenopathy:     Head:     Right side of head: No submental, submandibular, tonsillar, preauricular or posterior auricular adenopathy.     Left side of head: No submental, submandibular, tonsillar, preauricular or posterior auricular adenopathy.     Cervical: No cervical adenopathy.  Skin:    General: Skin is warm and dry.     Capillary Refill: Capillary refill takes less than 2 seconds.     Findings: No rash.  Neurological:     Mental Status: He is alert and oriented to person, place, and time.     Cranial Nerves: Cranial nerves are intact.     Gait: Gait is intact.     Deep Tendon Reflexes: Reflexes are normal and symmetric.     Reflex Scores:      Brachioradialis reflexes are 2+ on the right side and 2+ on the left side.      Patellar reflexes are 2+ on the right side and  2+ on the left side. Psychiatric:        Attention and Perception: Attention normal.        Mood and Affect: Mood normal.        Speech: Speech normal.        Behavior: Behavior normal. Behavior is cooperative.        Thought Content: Thought content normal.        Cognition and Memory: Cognition normal.        Judgment: Judgment normal.     Results for orders placed or performed in visit on 09/09/19  Bayer DCA Hb A1c Waived  Result Value Ref Range   HB A1C (BAYER DCA - WAIVED) 6.6 <7.0 %  Microalbumin, Urine Waived  Result Value Ref Range   Microalb, Ur Waived 80 (H) 0 - 19 mg/L   Creatinine, Urine Waived 200 10 - 300 mg/dL   Microalb/Creat Ratio 30-300 (H) <30 mg/g  Comprehensive metabolic panel  Result Value Ref Range   Glucose 212 (H) 65 - 99  mg/dL   BUN 17 8 - 27 mg/dL   Creatinine, Ser 0.94 0.76 - 1.27 mg/dL   GFR calc non Af Amer 85 >59 mL/min/1.73   GFR calc Af Amer 99 >59 mL/min/1.73   BUN/Creatinine Ratio 18 10 - 24   Sodium 139 134 - 144 mmol/L   Potassium 4.1 3.5 - 5.2 mmol/L   Chloride 100 96 - 106 mmol/L   CO2 21 20 - 29 mmol/L   Calcium 9.5 8.6 - 10.2 mg/dL   Total Protein 6.9 6.0 - 8.5 g/dL   Albumin 4.2 3.8 - 4.8 g/dL   Globulin, Total 2.7 1.5 - 4.5 g/dL   Albumin/Globulin Ratio 1.6 1.2 - 2.2   Bilirubin Total 0.4 0.0 - 1.2 mg/dL   Alkaline Phosphatase 102 39 - 117 IU/L   AST 21 0 - 40 IU/L   ALT 17 0 - 44 IU/L  CBC with Differential/Platelet  Result Value Ref Range   WBC 6.6 3.4 - 10.8 x10E3/uL   RBC 4.92 4.14 - 5.80 x10E6/uL   Hemoglobin 13.6 13.0 - 17.7 g/dL   Hematocrit 42.6 37.5 - 51.0 %   MCV 87 79 - 97 fL   MCH 27.6 26.6 - 33.0 pg   MCHC 31.9 31.5 - 35.7 g/dL   RDW 14.7 11.6 - 15.4 %   Platelets 266 150 - 450 x10E3/uL   Neutrophils 77 Not Estab. %   Lymphs 14 Not Estab. %   Monocytes 6 Not Estab. %   Eos 2 Not Estab. %   Basos 1 Not Estab. %   Neutrophils Absolute 5.0 1.4 - 7.0 x10E3/uL   Lymphocytes Absolute 0.9 0.7 - 3.1 x10E3/uL   Monocytes Absolute 0.4 0.1 - 0.9 x10E3/uL   EOS (ABSOLUTE) 0.2 0.0 - 0.4 x10E3/uL   Basophils Absolute 0.0 0.0 - 0.2 x10E3/uL   Immature Granulocytes 0 Not Estab. %   Immature Grans (Abs) 0.0 0.0 - 0.1 x10E3/uL  Lipid Panel w/o Chol/HDL Ratio  Result Value Ref Range   Cholesterol, Total 246 (H) 100 - 199 mg/dL   Triglycerides 200 (H) 0 - 149 mg/dL   HDL 59 >39 mg/dL   VLDL Cholesterol Cal 36 5 - 40 mg/dL   LDL Chol Calc (NIH) 151 (H) 0 - 99 mg/dL  TSH  Result Value Ref Range   TSH 1.560 0.450 - 4.500 uIU/mL  PSA  Result Value Ref Range   Prostate Specific Ag, Serum <0.1 0.0 -  4.0 ng/mL  VITAMIN D 25 Hydroxy (Vit-D Deficiency, Fractures)  Result Value Ref Range   Vit D, 25-Hydroxy 20.6 (L) 30.0 - 100.0 ng/mL      Assessment & Plan:   Problem  List Items Addressed This Visit      Cardiovascular and Mediastinum   Hypertension associated with diabetes ([place])    Chronic, ongoing.  BP initially elevated, but repeat improved.  Recommend checking these at home a few mornings a week and documenting.  Continue current medication regimen and adjust as needed + collaboration with cardiology.  Plan to obtain records from [place] and will obtain baseline clinic labs today.  Return in 3 months.      Relevant Medications   lisinopril (ZESTRIL) 5 MG tablet   nitroGLYCERIN (NITROSTAT) 0.4 MG SL tablet   empagliflozin (JARDIANCE) 10 MG TABS tablet   rivaroxaban (XARELTO) 20 MG TABS tablet   metoprolol succinate (TOPROL XL) 50 MG 24 hr tablet   Other Relevant Orders   Bayer DCA Hb A1c Waived (Completed)   Microalbumin, Urine Waived (Completed)   CBC with Differential/Platelet (Completed)   TSH (Completed)   Chronic diastolic CHF (congestive heart failure) (HCC)    Chronic, ongoing. Continue collaboration with cardiology and HF clinic.  Plan to obtain records from [place] and will obtain baseline clinic labs today. Recommend: - Reminded to call for an overnight weight gain of >2 pounds or a weekly weight weight of >5 pounds - not adding salt to his food and has been reading food labels. Reviewed the importance of keeping daily sodium intake to 2000mg  daily       Relevant Medications   lisinopril (ZESTRIL) 5 MG tablet   nitroGLYCERIN (NITROSTAT) 0.4 MG SL tablet   rivaroxaban (XARELTO) 20 MG TABS tablet   metoprolol succinate (TOPROL XL) 50 MG 24 hr tablet   Chronic venous insufficiency    Has significant history of cellulitis treatment multiple times in past.  Recommend continued use of compression hose on in AM and off at HS.  If recurrent infection or ongoing circulation issues, refer to vascular.      Relevant Medications   lisinopril (ZESTRIL) 5 MG tablet   nitroGLYCERIN (NITROSTAT) 0.4 MG SL tablet   rivaroxaban (XARELTO) 20 MG TABS tablet    metoprolol succinate (TOPROL XL) 50 MG 24 hr tablet     Respiratory   Obstructive sleep apnea on CPAP    Recommend 100% use of CPAP for overall health benefit.        Digestive   Esophageal dysmotility    Chronic, ongoing with poor control.  Will trial Prilosec 20 MG daily.  Recommend heavy focus on diet and avoiding foods that agitate symptoms. Keep food diary.  Return to office in 3 months or sooner if poor control.  Plan on short period PPI and will trial off in future.  Refuses referral for colonoscopy today.        Endocrine   Type 2 diabetes mellitus with proteinuria (HCC)    Chronic, ongoing with A1C below goal at 6.6% and urine ALB 80 and A:C 30-300.  Continue Lisinopril for kidney protection.  Continue current Jardiance, benefit with his HF.  Check CMP today.  Recommend he check BS at least 3 mornings a week at home.  Return to office in 3 months.      Relevant Medications   lisinopril (ZESTRIL) 5 MG tablet   empagliflozin (JARDIANCE) 10 MG TABS tablet   Other Relevant Orders   Bayer [facility]  Hb A1c Waived (Completed)   Microalbumin, Urine Waived (Completed)   Hyperlipidemia associated with type 2 diabetes mellitus (HCC)    Chronic, ongoing.  Continue current medication regimen and adjust as needed.  Plan to obtain records from [place] and will obtain baseline clinic labs this visit.      Relevant Medications   lisinopril (ZESTRIL) 5 MG tablet   empagliflozin (JARDIANCE) 10 MG TABS tablet   Other Relevant Orders   Bayer DCA Hb A1c Waived (Completed)   Comprehensive metabolic panel (Completed)   Lipid Panel w/o Chol/HDL Ratio (Completed)     Musculoskeletal and Integument   Osteoporosis    Ongoing, continue daily calcium and Vit D, would be cautious with bisphosphonates due to his uncontrolled GERD.  May benefit from injectable vs oral treatment, discuss further at upcoming appointments.      Relevant Medications   cholecalciferol (VITAMIN D3) 25 MCG (1000 UNIT) tablet      Other   Obesity, morbid (more than 100 lbs over ideal weight or BMI > 40) (HCC)    Recommend continued focus on healthy diet choices and regular physical activity (30 minutes 5 days a week).  Set small, achievable goals.      Relevant Medications   empagliflozin (JARDIANCE) 10 MG TABS tablet   Anxiety    Chronic, ongoing with some grandiose thought processes present, possible schizophrenia.  Continue current medication regimen and adjust as needed.  CCM referral placed last visit, would benefit from ongoing discussion of medication regimen and addition of pill packs to assist with medication adherence, at this time unsure he is taking entire medication regimen.      History of prostate cancer    Continue collaboration with oncology and urology.  PSA today.       Other Visit Diagnoses    Annual physical exam    -  Primary   Relevant Orders   PSA (Completed)   Vitamin D deficiency       Check Vit D level today.   Relevant Orders   VITAMIN D 25 Hydroxy (Vit-D Deficiency, Fractures) (Completed)       Discussed aspirin prophylaxis for myocardial infarction prevention and decision was on Xarelto and ASA, have discussed possible dicontinuation of ASA with patient.  LABORATORY TESTING:  Health maintenance labs ordered today as discussed above.   The natural history of prostate cancer and ongoing controversy regarding screening and potential treatment outcomes of prostate cancer has been discussed with the patient. The meaning of a false positive PSA and a false negative PSA has been discussed. He indicates understanding of the limitations of this screening test and wishes to proceed with screening PSA testing.   IMMUNIZATIONS:   - Tdap: Tetanus vaccination status reviewed: refused today. - Influenza: Refused - Pneumovax: Refused - Prevnar: Not applicable - Zostavax vaccine: Refused  SCREENING: - Colonoscopy: Refused  Discussed with patient purpose of the colonoscopy is to  detect colon cancer at curable precancerous or early stages   - AAA Screening: Not applicable  -Hearing Test: Not applicable  -Spirometry: Not applicable   PATIENT COUNSELING:    Sexuality: Discussed sexually transmitted diseases, partner selection, use of condoms, avoidance of unintended pregnancy  and contraceptive alternatives.   Advised to avoid cigarette smoking.  I discussed with the patient that most people either abstain from alcohol or drink within safe limits (<=14/week and <=4 drinks/occasion for males, <=7/weeks and <= 3 drinks/occasion for females) and that the risk for alcohol disorders and other health effects  rises proportionally with the number of drinks per week and how often a drinker exceeds daily limits.  Discussed cessation/primary prevention of drug use and availability of treatment for abuse.   Diet: Encouraged to adjust caloric intake to maintain  or achieve ideal body weight, to reduce intake of dietary saturated fat and total fat, to limit sodium intake by avoiding high sodium foods and not adding table salt, and to maintain adequate dietary potassium and calcium preferably from fresh fruits, vegetables, and low-fat dairy products.    stressed the importance of regular exercise  Injury prevention: Discussed safety belts, safety helmets, smoke detector, smoking near bedding or upholstery.   Dental health: Discussed importance of regular tooth brushing, flossing, and dental visits.   Follow up plan: NEXT PREVENTATIVE PHYSICAL DUE IN 1 YEAR. Return in about 3 months (around 12/07/2019) for T2DM, HTN/HLD, Mood.

## 2019-09-09 NOTE — Assessment & Plan Note (Signed)
Continue collaboration with oncology and urology.  PSA today. 

## 2019-09-09 NOTE — Assessment & Plan Note (Signed)
Recommend continued focus on healthy diet choices and regular physical activity (30 minutes 5 days a week).  Set small, achievable goals.

## 2019-09-09 NOTE — Assessment & Plan Note (Signed)
Ongoing, continue daily calcium and Vit D, would be cautious with bisphosphonates due to his uncontrolled GERD.  May benefit from injectable vs oral treatment, discuss further at upcoming appointments.

## 2019-09-09 NOTE — Assessment & Plan Note (Signed)
Chronic, ongoing. Continue collaboration with cardiology and HF clinic.  Plan to obtain records from [place] and will obtain baseline clinic labs today. Recommend: - Reminded to call for an overnight weight gain of >2 pounds or a weekly weight weight of >5 pounds - not adding salt to his food and has been reading food labels. Reviewed the importance of keeping daily sodium intake to 2000mg  daily

## 2019-09-09 NOTE — Assessment & Plan Note (Signed)
Chronic, ongoing with some grandiose thought processes present, possible schizophrenia.  Continue current medication regimen and adjust as needed.  CCM referral placed last visit, would benefit from ongoing discussion of medication regimen and addition of pill packs to assist with medication adherence, at this time unsure he is taking entire medication regimen.

## 2019-09-09 NOTE — Assessment & Plan Note (Signed)
Chronic, ongoing.  Continue current medication regimen and adjust as needed.  Plan to obtain records from [facility] and will obtain baseline clinic labs this visit.

## 2019-09-09 NOTE — Assessment & Plan Note (Signed)
Chronic, ongoing with A1C below goal at 6.6% and urine ALB 80 and A:C 30-300.  Continue Lisinopril for kidney protection.  Continue current Jardiance, benefit with his HF.  Check CMP today.  Recommend he check BS at least 3 mornings a week at home.  Return to office in 3 months.

## 2019-09-09 NOTE — Assessment & Plan Note (Signed)
Has significant history of cellulitis treatment multiple times in past.  Recommend continued use of compression hose on in AM and off at HS.  If recurrent infection or ongoing circulation issues, refer to vascular.

## 2019-09-10 ENCOUNTER — Other Ambulatory Visit: Payer: Self-pay | Admitting: Nurse Practitioner

## 2019-09-10 LAB — CBC WITH DIFFERENTIAL/PLATELET
Basophils Absolute: 0 10*3/uL (ref 0.0–0.2)
Basos: 1 %
EOS (ABSOLUTE): 0.2 10*3/uL (ref 0.0–0.4)
Eos: 2 %
Hematocrit: 42.6 % (ref 37.5–51.0)
Hemoglobin: 13.6 g/dL (ref 13.0–17.7)
Immature Grans (Abs): 0 10*3/uL (ref 0.0–0.1)
Immature Granulocytes: 0 %
Lymphocytes Absolute: 0.9 10*3/uL (ref 0.7–3.1)
Lymphs: 14 %
MCH: 27.6 pg (ref 26.6–33.0)
MCHC: 31.9 g/dL (ref 31.5–35.7)
MCV: 87 fL (ref 79–97)
Monocytes Absolute: 0.4 10*3/uL (ref 0.1–0.9)
Monocytes: 6 %
Neutrophils Absolute: 5 10*3/uL (ref 1.4–7.0)
Neutrophils: 77 %
Platelets: 266 10*3/uL (ref 150–450)
RBC: 4.92 x10E6/uL (ref 4.14–5.80)
RDW: 14.7 % (ref 11.6–15.4)
WBC: 6.6 10*3/uL (ref 3.4–10.8)

## 2019-09-10 LAB — COMPREHENSIVE METABOLIC PANEL
ALT: 17 IU/L (ref 0–44)
AST: 21 IU/L (ref 0–40)
Albumin/Globulin Ratio: 1.6 (ref 1.2–2.2)
Albumin: 4.2 g/dL (ref 3.8–4.8)
Alkaline Phosphatase: 102 IU/L (ref 39–117)
BUN/Creatinine Ratio: 18 (ref 10–24)
BUN: 17 mg/dL (ref 8–27)
Bilirubin Total: 0.4 mg/dL (ref 0.0–1.2)
CO2: 21 mmol/L (ref 20–29)
Calcium: 9.5 mg/dL (ref 8.6–10.2)
Chloride: 100 mmol/L (ref 96–106)
Creatinine, Ser: 0.94 mg/dL (ref 0.76–1.27)
GFR calc Af Amer: 99 mL/min/{1.73_m2} (ref >59)
GFR calc non Af Amer: 85 mL/min/{1.73_m2} (ref >59)
Globulin, Total: 2.7 g/dL (ref 1.5–4.5)
Glucose: 212 mg/dL — ABNORMAL HIGH (ref 65–99)
Potassium: 4.1 mmol/L (ref 3.5–5.2)
Sodium: 139 mmol/L (ref 134–144)
Total Protein: 6.9 g/dL (ref 6.0–8.5)

## 2019-09-10 LAB — PSA: Prostate Specific Ag, Serum: <0.1 ng/mL (ref 0.0–4.0)

## 2019-09-10 LAB — LIPID PANEL W/O CHOL/HDL RATIO
Cholesterol, Total: 246 mg/dL — ABNORMAL HIGH (ref 100–199)
HDL: 59 mg/dL (ref >39)
LDL Chol Calc (NIH): 151 mg/dL — ABNORMAL HIGH (ref 0–99)
Triglycerides: 200 mg/dL — ABNORMAL HIGH (ref 0–149)
VLDL Cholesterol Cal: 36 mg/dL (ref 5–40)

## 2019-09-10 LAB — TSH: TSH: 1.56 u[IU]/mL (ref 0.450–4.500)

## 2019-09-10 LAB — VITAMIN D 25 HYDROXY (VIT D DEFICIENCY, FRACTURES): Vit D, 25-Hydroxy: 20.6 ng/mL — ABNORMAL LOW (ref 30.0–100.0)

## 2019-09-10 MED ORDER — ROSUVASTATIN CALCIUM 20 MG PO TABS: 40.0000 mg | ORAL_TABLET | Freq: Every day | ORAL | 3 refills | Status: DC

## 2019-09-10 NOTE — Progress Notes (Signed)
Contacted via MyChart

## 2019-09-12 ENCOUNTER — Encounter: Payer: Self-pay | Admitting: Nurse Practitioner

## 2019-09-12 ENCOUNTER — Ambulatory Visit: Payer: Medicare Other | Admitting: Pharmacist

## 2019-09-12 ENCOUNTER — Ambulatory Visit (INDEPENDENT_AMBULATORY_CARE_PROVIDER_SITE_OTHER): Payer: Medicare Other | Admitting: Licensed Clinical Social Worker

## 2019-09-12 DIAGNOSIS — I4819 Other persistent atrial fibrillation: Secondary | ICD-10-CM

## 2019-09-12 DIAGNOSIS — E1159 Type 2 diabetes mellitus with other circulatory complications: Secondary | ICD-10-CM | POA: Diagnosis not present

## 2019-09-12 DIAGNOSIS — J449 Chronic obstructive pulmonary disease, unspecified: Secondary | ICD-10-CM | POA: Insufficient documentation

## 2019-09-12 DIAGNOSIS — I25810 Atherosclerosis of coronary artery bypass graft(s) without angina pectoris: Secondary | ICD-10-CM

## 2019-09-12 DIAGNOSIS — M4802 Spinal stenosis, cervical region: Secondary | ICD-10-CM | POA: Insufficient documentation

## 2019-09-12 DIAGNOSIS — R809 Proteinuria, unspecified: Secondary | ICD-10-CM

## 2019-09-12 DIAGNOSIS — I10 Essential (primary) hypertension: Secondary | ICD-10-CM | POA: Diagnosis not present

## 2019-09-12 DIAGNOSIS — I5032 Chronic diastolic (congestive) heart failure: Secondary | ICD-10-CM | POA: Diagnosis not present

## 2019-09-12 DIAGNOSIS — F423 Hoarding disorder: Secondary | ICD-10-CM | POA: Insufficient documentation

## 2019-09-12 DIAGNOSIS — I2510 Atherosclerotic heart disease of native coronary artery without angina pectoris: Secondary | ICD-10-CM | POA: Insufficient documentation

## 2019-09-12 DIAGNOSIS — E11319 Type 2 diabetes mellitus with unspecified diabetic retinopathy without macular edema: Secondary | ICD-10-CM | POA: Insufficient documentation

## 2019-09-12 DIAGNOSIS — I878 Other specified disorders of veins: Secondary | ICD-10-CM | POA: Insufficient documentation

## 2019-09-12 DIAGNOSIS — F419 Anxiety disorder, unspecified: Secondary | ICD-10-CM

## 2019-09-12 DIAGNOSIS — E1129 Type 2 diabetes mellitus with other diabetic kidney complication: Secondary | ICD-10-CM

## 2019-09-12 NOTE — Patient Instructions (Signed)
Visit Information  Goals Addressed            This Visit's Progress     Patient Stated   . PharmD "I am confused by my medications" (pt-stated)       Current Barriers:  . Polypharmacy; complex patient with multiple comorbidities including ASCVD (hx CABG, MI); Afib, CHF, T2DM, osteoporosis, hx prostate cancer (s/p Lupron therapy, d/c d/t behavioral disturbances). [facility] with [facility], but recently stopped with that program. Is struggling to manage medications and healthcare independently.  . Just picked up a 90 day supply of prescriptions from [facility]. Is interested in transitioning back to adherence packaging. o Afib/HTN/HFpEF: Xarelto 20 mg daily, furosemide 80 mg QAM, 40 mg QPM, lisinopril 10 mg daily, metoprolol succinate 50 mg daily,  o ASCVD risk reduction: ASA 81 mg daily, rosuvastatin 40 mg daily; per records, last stent 2002 o T2DM: Jardiance 10 mg daily; A1c at goal <7% o Osteoporosis: Last DEXA showed t score -3.1% at femoral neck.; Vitamin D 1000 units daily o Anxiety: risperdone 0.5 mg BID  Pharmacist Clinical Goal(s):  Marland Kitchen Over the next 90 days, patient will work with PharmD and provider towards optimized medication management  Interventions: . Comprehensive medication review performed; medication list updated in electronic medical record . Provided instruction on what time of day to take medications, will mail along w/ pill box. o AM: lisinopril 10 mg (2 5 mg tab), metoprolol succinate 50 mg, furosemide 80 mg, Jardiance 10 mg daily, Vitamin D, risperdone 0.5 mg o ~2 pm: furosemide 40 mg o Bedtime: rosuvastatin 40 mg (2 20 mg tab), risperdone 0.5 mg, Xarelto 20 mg  . Patient has appt w/ Dr. [name] next week. Will message Dr. [name] to determine if benefit of ASA 81 in combo w/ Xarelto 20 mg daily outweighs increased risk of bleeds . Patient just picked up 90 day supplies of medications. Patient amenable to transfer to a local pharmacy that offers adherence packing  and delivery. Will plan to transfer medications to [facility] and start adherence packing when it is time to fill medications again . Moving forward, will collaborate w/ PCP to determine appropriate therapy for osteoporosis. Agree w/ avoiding PO bisphosphonate given severe GERD symptoms currently reported. Consider anabolic therapy, given severe t score. Will reinforce appropriate calcium + vitamin D supplementation moving forward . Will continue to collaborate w/ LCSW and RN CM for comprehensive support   Patient Self Care Activities:  . Patient will take medications as prescribed  Initial goal documentation        The patient verbalized understanding of instructions provided today and declined a print copy of patient instruction materials.   Plan: - Scheduled f/u call 10/15/19  Catie Darnelle Maffucci, PharmD, [place] [phone]

## 2019-09-12 NOTE — Chronic Care Management (AMB) (Signed)
Chronic Care Management   Note  09/12/2019 Name: Evan [name] MRN: [id number] DOB: 1956-06-24   Subjective:  Evan [name] is a 64 y.o. year old male who is a primary care patient of [name], Henrine Screws T, NP. The CCM team was consulted for assistance with chronic disease management and care coordination needs.    Contacted patient as scheduled for medication management review.    Review of patient status, including review of consultants reports, laboratory and other test data, was performed as part of comprehensive evaluation and provision of chronic care management services.   Objective:  Lab Results  Component Value Date   CREATININE 0.94 09/09/2019   CREATININE 0.90 05/22/2019   CREATININE 0.93 08/08/2018    Lab Results  Component Value Date   HGBA1C 6.6 09/09/2019       Component Value Date/Time   CHOL 246 (H) 09/09/2019 1128   CHOL 194 12/30/2013 0413   TRIG 200 (H) 09/09/2019 1128   TRIG 158 12/30/2013 0413   HDL 59 09/09/2019 1128   HDL 42 12/30/2013 0413   CHOLHDL 4 09/12/2013 1035   VLDL 32 12/30/2013 0413   LDLCALC 151 (H) 09/09/2019 1128   LDLCALC 120 (H) 12/30/2013 0413   LDLDIRECT 178.5 07/26/2011 1234    Clinical ASCVD: Yes  The ASCVD Risk score Mikey Bussing DC Jr., et al., 2013) failed to calculate for the following reasons:   The patient has a prior MI or stroke diagnosis    BP Readings from Last 3 Encounters:  09/09/19 136/82  08/20/19 (!) 152/82  08/11/19 138/86    Allergies  Allergen Reactions  . Penicillin G Hives  . Sulfa Antibiotics Hives  . Tiotropium   . Zoloft [Sertraline Hcl] Other (See Comments)    Medications Reviewed Today    Reviewed by De Hollingshead, [facility] (Pharmacist) on 09/12/19 at 1542  Med List Status: <None>  Medication Order Taking? Sig Documenting Provider Last Dose Status Informant  acetaminophen (TYLENOL) 500 MG tablet [id number] Yes Take 500 mg by mouth every 6 (six) hours as needed. [provider] Taking  Active   aspirin 81 MG EC tablet [id number] No Take 81 mg by mouth daily.  [provider] Not Taking Active   budesonide-formoterol (SYMBICORT) 80-4.5 MCG/ACT inhaler [id number] No Inhale 2 puffs into the lungs 2 (two) times daily. [provider] Not Taking Active   cholecalciferol (VITAMIN D3) 25 MCG (1000 UNIT) tablet [id number] Yes Take 1 tablet (1,000 Units total) by mouth daily. [name], NP Taking Active   Emollient (EUCERIN) lotion [id number] Yes Apply topically as needed for dry skin. Apply to legs and arms daily to help with dry skin. [name] T, NP Taking Active   empagliflozin (JARDIANCE) 10 MG TABS tablet [id number] Yes Take 10 mg by mouth daily. [name] T, NP Taking Active   fluticasone (FLONASE) 50 MCG/ACT nasal spray [id number] Yes Place 2 sprays into both nostrils daily.  [provider] Taking Active            Med Note Darnelle Maffucci, Arville Lime   Fri Sep 12, 2019  3:10 PM)    furosemide (LASIX) 40 MG tablet [id number] Yes Take 1 tablet (40 mg total) by mouth daily. Take 80 mg in the am with 40 mg in the pm. [name] T, NP Taking Active         Discontinued 09/12/19 1541 (Completed Course)   ipratropium-albuterol (DUONEB) 0.5-2.5 (3) MG/3ML SOLN [id number] No Take 3 mLs by nebulization  every 4 (four) hours as needed. [provider] Not Taking Active   lisinopril (ZESTRIL) 5 MG tablet [id number] Yes Take 2 tablets (10 mg total) by mouth daily. [name] T, NP Taking Active   metoprolol succinate (TOPROL XL) 50 MG 24 hr tablet [id number] Yes Take 1 tablet (50 mg total) by mouth daily. [name] T, NP Taking Active   nitroGLYCERIN (NITROSTAT) 0.4 MG SL tablet [id number] No Place 1 tablet (0.4 mg total) under the tongue every 5 (five) minutes as needed for chest pain.  Patient not taking: Reported on 09/12/2019   [name] T, NP Not Taking Active   omeprazole (PRILOSEC) 20 MG capsule [id number] Yes Take 1 capsule (20 mg  total) by mouth daily. [name] T, NP Taking Active   PROAIR HFA 108 (90 BASE) MCG/ACT inhaler [id number] No Inhale 1 puff into the lungs every 4 (four) hours as needed.  [provider] Not Taking Active            Med Note Darnelle Maffucci, Arville Lime   Fri Sep 12, 2019  3:07 PM)    risperiDONE (RISPERDAL) 0.5 MG tablet [id number] Yes Take 1 tablet (0.5 mg total) by mouth 2 (two) times daily. [name] T, NP Taking Active   rivaroxaban (XARELTO) 20 MG TABS tablet [id number] Yes Take 1 tablet (20 mg total) by mouth at bedtime. [name] T, NP Taking Active   rosuvastatin (CRESTOR) 20 MG tablet [id number] Yes Take 2 tablets (40 mg total) by mouth at bedtime. [name], NP Taking Active   Med List Note Arby Barrette, CPhT 08/08/18 0813): [facility] [phone]           Assessment:   Goals Addressed            This Visit's Progress     Patient Stated   . PharmD "I am confused by my medications" (pt-stated)       Current Barriers:  . Polypharmacy; complex patient with multiple comorbidities including ASCVD (hx CABG, MI); Afib, CHF, T2DM, osteoporosis, hx prostate cancer (s/p Lupron therapy, d/c d/t behavioral disturbances). Previously with [facility], but recently stopped with that program. Is struggling to manage medications and healthcare independently.  . Just picked up a 90 day supply of prescriptions from [facility]. Is interested in transitioning back to adherence packaging. o Afib/HTN/HFpEF: Xarelto 20 mg daily, furosemide 80 mg QAM, 40 mg QPM, lisinopril 10 mg daily, metoprolol succinate 50 mg daily,  o ASCVD risk reduction: ASA 81 mg daily, rosuvastatin 40 mg daily; per records, last stent 2002 o T2DM: Jardiance 10 mg daily; A1c at goal <7% o Osteoporosis: Last DEXA showed t score -3.1% at femoral neck.; Vitamin D 1000 units daily o Anxiety: risperdone 0.5 mg BID  Pharmacist Clinical Goal(s):  Marland Kitchen Over the next 90 days, patient will work with  PharmD and provider towards optimized medication management  Interventions: . Comprehensive medication review performed; medication list updated in electronic medical record . Provided instruction on what time of day to take medications, will mail along w/ pill box. o AM: lisinopril 10 mg (2 5 mg tab), metoprolol succinate 50 mg, furosemide 80 mg, Jardiance 10 mg daily, Vitamin D, risperdone 0.5 mg o ~2 pm: furosemide 40 mg o Bedtime: rosuvastatin 40 mg (2 20 mg tab), risperdone 0.5 mg, Xarelto 20 mg  . Patient has appt w/ Dr. [name] next week. Will message Dr. [name] to determine if benefit of ASA 81 in combo w/ Xarelto  20 mg daily outweighs increased risk of bleeds . Patient just picked up 90 day supplies of medications. Patient amenable to transfer to a local pharmacy that offers adherence packing and delivery. Will plan to transfer medications to [facility] and start adherence packing when it is time to fill medications again . Moving forward, will collaborate w/ PCP to determine appropriate therapy for osteoporosis. Agree w/ avoiding PO bisphosphonate given severe GERD symptoms currently reported. Consider anabolic therapy, given severe t score. Will reinforce appropriate calcium + vitamin D supplementation moving forward . Will continue to collaborate w/ LCSW and RN CM for comprehensive support   Patient Self Care Activities:  . Patient will take medications as prescribed  Initial goal documentation        Plan: - Scheduled f/u call 10/15/19  Catie Darnelle Maffucci, PharmD, [place] [phone]

## 2019-09-12 NOTE — Chronic Care Management (AMB) (Signed)
Chronic Care Management    Clinical Social Work General Note  09/12/2019 Name: Evan [name] MRN: [id number] DOB: July 25, 1956  Evan [name] is a 64 y.o. year old male who is a primary care patient of [name], Evan Faster, NP. The CCM was consulted to assist the patient with Level of Care Concerns.   Evan [name] was given information about Chronic Care Management services today including:  1. CCM service includes personalized support from designated clinical staff supervised by his physician, including individualized plan of care and coordination with other care providers 2. 24/7 contact phone numbers for assistance for urgent and routine care needs. 3. Service will only be billed when office clinical staff spend 20 minutes or more in a month to coordinate care. 4. Only one practitioner may furnish and bill the service in a calendar month. 5. The patient may stop CCM services at any time (effective at the end of the month) by phone call to the office staff. 6. The patient will be responsible for cost sharing (co-pay) of up to 20% of the service fee (after annual deductible is met).  Patient agreed to services and verbal consent obtained.   Review of patient status, including review of consultants reports, relevant laboratory and other test results, and collaboration with appropriate care team members and the patient's provider was performed as part of comprehensive patient evaluation and provision of chronic care management services.    SDOH (Social Determinants of Health) screening performed today. See Care Plan Entry related to challenges with: Stress Physical Activity  Outpatient Encounter Medications as of 09/12/2019  Medication Sig Note  . aspirin 81 MG EC tablet Take 81 mg by mouth 2 (two) times daily.    . budesonide-formoterol (SYMBICORT) 80-4.5 MCG/ACT inhaler Inhale 2 puffs into the lungs 2 (two) times daily.   . cholecalciferol (VITAMIN D3) 25 MCG (1000 UNIT) tablet Take 1 tablet  (1,000 Units total) by mouth daily.   . Emollient (EUCERIN) lotion Apply topically as needed for dry skin. Apply to legs and arms daily to help with dry skin.   Marland Kitchen empagliflozin (JARDIANCE) 10 MG TABS tablet Take 10 mg by mouth daily.   . fluticasone (FLONASE) 50 MCG/ACT nasal spray Place 2 sprays into both nostrils daily.  01/09/2014: Received from: External Pharmacy  . furosemide (LASIX) 40 MG tablet Take 1 tablet (40 mg total) by mouth daily. Take 80 mg in the am with 40 mg in the pm.   . ibuprofen (ADVIL,MOTRIN) 600 MG tablet Take 600 mg by mouth every 6 (six) hours as needed.   Marland Kitchen ipratropium-albuterol (DUONEB) 0.5-2.5 (3) MG/3ML SOLN Take 3 mLs by nebulization every 4 (four) hours as needed.   Marland Kitchen lisinopril (ZESTRIL) 5 MG tablet Take 2 tablets (10 mg total) by mouth daily.   Marland Kitchen loratadine (CLARITIN) 10 MG tablet Take 10 mg by mouth daily.   . metoprolol succinate (TOPROL XL) 50 MG 24 hr tablet Take 1 tablet (50 mg total) by mouth daily.   . nitroGLYCERIN (NITROSTAT) 0.4 MG SL tablet Place 1 tablet (0.4 mg total) under the tongue every 5 (five) minutes as needed for chest pain.   Marland Kitchen omeprazole (PRILOSEC) 20 MG capsule Take 1 capsule (20 mg total) by mouth daily.   Marland Kitchen PROAIR HFA 108 (90 BASE) MCG/ACT inhaler Inhale 1 puff into the lungs every 4 (four) hours as needed.  01/09/2014: Received from: External Pharmacy  . risperiDONE (RISPERDAL) 0.5 MG tablet Take 1 tablet (0.5 mg total) by mouth 2 (  two) times daily.   . rivaroxaban (XARELTO) 20 MG TABS tablet Take 1 tablet (20 mg total) by mouth at bedtime.   . rosuvastatin (CRESTOR) 20 MG tablet Take 2 tablets (40 mg total) by mouth at bedtime.    No facility-administered encounter medications on file as of 09/12/2019.    Goals Addressed    . "I left [facility] and need more support now." (pt-stated)       Current Barriers:  . Financial constraints related to managing health care . Limited social support . ADL IADL limitations . Social Isolation . Limited  access to caregiver . Inability to perform ADL's independently . Inability to perform IADL's independently  Clinical Social Work Clinical Goal(s):  Marland Kitchen Over the next 120 days, patient will work with SW to address concerns related to lack of support within the home . Over the next 120 days, patient will demonstrate improved health management independence as evidenced by implementing appropriate self-care into daily routine    Interventions:  . Patient interviewed and appropriate assessments performed-new referral . Patient reports receiving a call from a Medicare nurse last night at 8 pm. This is concerning to him as he does not prefer phone calls at night and informed the nurse of this preference.  . Provided patient with information about level of care options, personal care service resources and Medicaid benefits.  . Discussed plans with patient for ongoing care management follow up and provided patient with direct contact information for care management team . Collaborated with Medicaid (community agency) re: need for Medicaid information (number and caseworker) LCSW left detailed voice message with Evan [name] who manages all active and ongoing Medicaid cases.  Marland Kitchen LCSW provided reflective listening and self-care education.  . Assisted patient/caregiver with obtaining information about health plan benefits . A voluntary and extensive discussion about advanced care planning including explanation and discussion of advanced was undertaken with the patient.  Explanation regarding healthcare proxy and living will was reviewed and packet with forms with explanation of how to fill them out was given.   . Provided education to patient/caregiver regarding level of care options. . Patient was educated on long term care placement.  . Provided education to patient/caregiver about Hospice and/or Palliative Care services . UPDATE- Patient is new to [facility] and is in need of community resource connection and is  unable to provide Medicaid number and reports that DSS informed him that they are in the process of transitioning his Medicaid from [facility] to [employer]. Patient is agreeable to [facility] referral once Medicaid information has been received. LCSW will await for return call DSS Medicaid caseworker.   Patient Self Care Activities:  . Attends all scheduled provider appointments . Lacks social connections  Initial goal documentation     Follow Up Plan: SW will follow up with patient by phone over the next 14 days      [name], [employer], MSW, [email or web address] Phone: [phone]

## 2019-09-16 ENCOUNTER — Encounter: Payer: Self-pay | Admitting: Cardiovascular Disease

## 2019-09-16 ENCOUNTER — Ambulatory Visit: Payer: Self-pay | Admitting: Pharmacist

## 2019-09-16 ENCOUNTER — Other Ambulatory Visit: Payer: Self-pay

## 2019-09-16 ENCOUNTER — Ambulatory Visit (INDEPENDENT_AMBULATORY_CARE_PROVIDER_SITE_OTHER): Payer: Medicare Other | Admitting: Cardiovascular Disease

## 2019-09-16 VITALS — BP 148/80 | HR 94 | Ht 65.0 in | Wt 235.2 lb

## 2019-09-16 DIAGNOSIS — I25118 Atherosclerotic heart disease of native coronary artery with other forms of angina pectoris: Secondary | ICD-10-CM | POA: Diagnosis not present

## 2019-09-16 DIAGNOSIS — I4819 Other persistent atrial fibrillation: Secondary | ICD-10-CM

## 2019-09-16 DIAGNOSIS — I5032 Chronic diastolic (congestive) heart failure: Secondary | ICD-10-CM | POA: Diagnosis not present

## 2019-09-16 DIAGNOSIS — I10 Essential (primary) hypertension: Secondary | ICD-10-CM

## 2019-09-16 DIAGNOSIS — E119 Type 2 diabetes mellitus without complications: Secondary | ICD-10-CM

## 2019-09-16 NOTE — Patient Instructions (Addendum)
Mr. Heinzmann,   I suggest you fill your pill box like this:   Morning: - Lisinopril 10 mg (two 5 mg tablets) - Metoprolol succinate 50 mg  - Furosemide 40 mg  - Jardiance 10 mg - Vitamin D - Risperidone 0.5 mg   Afternoon (around 2 pm): - Furosemide 40 mg   Bedtime: - Rosuvastatin 40 mg (two 20 mg tablets) - Xarelto 20 mg  - Risperidone 0.5 mg  - Aspirin 81 mg daily   Do feel free to call with any questions!  Catie Darnelle Maffucci, PharmD [phone]  Visit Information  Goals Addressed            This Visit's Progress     Patient Stated   . PharmD "I am confused by my medications" (pt-stated)       Current Barriers:  . Polypharmacy; complex patient with multiple comorbidities including ASCVD (hx CABG, MI); Afib, CHF, T2DM, osteoporosis, hx prostate cancer (s/p Lupron therapy, d/c d/t behavioral disturbances). [facility] with [facility], but recently stopped with that program. Is struggling to manage medications and healthcare independently.  o Requested a pill box and list of medications.  . Just picked up a 90 day supply of prescriptions from [facility]. Is interested in transitioning back to adherence packaging. o Afib/HTN/HFpEF: Xarelto 20 mg daily, furosemide 80 mg QAM, 40 mg QPM, lisinopril 10 mg daily, metoprolol succinate 50 mg daily,  o ASCVD risk reduction: ASA 81 mg daily, rosuvastatin 40 mg daily; per records, last stent 2002 o T2DM: Jardiance 10 mg daily; A1c at goal <7% o Osteoporosis: Last DEXA showed t score -3.1% at femoral neck.; Vitamin D 1000 units daily o Anxiety: risperdone 0.5 mg BID  Pharmacist Clinical Goal(s):  Marland Kitchen Over the next 90 days, patient will work with PharmD and provider towards optimized medication management  Interventions: . Printed instructions on how to fill pill box. Mailing along w/ QID pill bos  Patient Self Care Activities:  . Patient will take medications as prescribed  Please see past updates related to this goal by clicking  on the "Past Updates" button in the selected goal         Print copy of patient instructions provided.    Plan:  - Will outreach patient as previously scheduled  Catie Darnelle Maffucci, PharmD, [place] [phone]

## 2019-09-16 NOTE — Progress Notes (Signed)
Cardiology Office Note  Date:  09/16/2019   ID:  Evan [name], DOB 11-11-1955, MRN [id number]  PCP:  [name], NP   Chief Complaint  Patient presents with  . Est care- Cardiac history- no current complaints    HPI:  [name] is a 64 year old gentleman with  morbid obesity,  chronic diastolic CHF,   coronary artery disease with bypass surgery in 2009 with 6 vessel bypass, numerous stenting dating back to the early 2000 ,  on disability lower extremity swelling.   obstructive sleep apnea, uses CPAP nightly anxiety and obsessive-compulsive disorder  Diabetes type 2 with complications Presenting for new patient evaluation of his coronary disease,   last seen in our clinic February 2016 Seen by [facility] clinic in 2017 one visit, records reviewed   Notes indicating [facility] by [facility] Presumably started on Xarelto by their group for atrial fibrillation though details unavailable  Echocardiogram August 28, 2019 reviewed with him in detail EF 50 to 55%, mildly dilated left ventricular cavity, Otherwise normal study  Chronic lower extremity swelling Previous recommendation to wear compression hose  Lab work last week Vitamin D of 20 Total cholesterol 246, LDL 151 Hemoglobin A1c 6.6 Normal renal function  EKG personally reviewed by myself on todays visit Atrial fib rate 94 bpm, PVCs noted  Surgical details  LIMA to the LAD, vein graft to the diagonal, vein graft to the OM, vein graft to the ramus, vein graft to OM 2 and vein graft to the distal RCA   PMH:   has a past medical history of Anginal pain ([place]), Anxiety disorder, Asthma, Atresia of esophagus without fistula, CAD (coronary artery disease), Cellulitis, CHF (congestive heart failure) ([place]), COPD (chronic obstructive pulmonary disease) ([place]), Diabetes mellitus without complication ([place]), Edema, Gastroesophageal reflux, H/O: GI bleed, History of pneumonia, History of scarlet fever, Hyperlipidemia,  Hypertension, Myocardial infarction ([place]) (2009), Obesity, Obstructive sleep apnea, Pain, Panic disorder, Peripheral venous insufficiency, PTSD (post-traumatic stress disorder), Retinopathy, Stasis, venous, and Vertigo.  PSH:    Past Surgical History:  Procedure Laterality Date  . CARDIAC CATHETERIZATION    . CATARACT EXTRACTION Left   . CATARACT EXTRACTION W/PHACO Right 05/03/2017   Procedure: CATARACT EXTRACTION PHACO AND INTRAOCULAR LENS PLACEMENT (IOC);  Surgeon: Leandrew Koyanagi, MD;  Location: [facility] ORS;  Service: Ophthalmology;  Laterality: Right;  [place] 00:35.3 AP% 12.3 CDE 4.33 Fluid Pack lot # [id number]       . CORONARY ANGIOPLASTY WITH STENT PLACEMENT  2002  . CORONARY ANGIOPLASTY WITH STENT PLACEMENT  1999  . CORONARY ARTERY BYPASS GRAFT     x7  . ESOPHAGOGASTRODUODENOSCOPY N/A 09/19/2016   Procedure: ESOPHAGOGASTRODUODENOSCOPY (EGD);  Surgeon: Lollie Sails, MD;  Location: [facility] ENDOSCOPY;  Service: Endoscopy;  Laterality: N/A;    Current Outpatient Medications  Medication Sig Dispense Refill  . acetaminophen (TYLENOL) 500 MG tablet Take 500 mg by mouth every 6 (six) hours as needed.    Marland Kitchen aspirin 81 MG EC tablet Take 81 mg by mouth daily.     . budesonide-formoterol (SYMBICORT) 80-4.5 MCG/ACT inhaler Inhale 2 puffs into the lungs 2 (two) times daily.    . cholecalciferol (VITAMIN D3) 25 MCG (1000 UNIT) tablet Take 1 tablet (1,000 Units total) by mouth daily. 90 tablet 3  . Emollient (EUCERIN) lotion Apply topically as needed for dry skin. Apply to legs and arms daily to help with dry skin. 240 mL 2  . empagliflozin (JARDIANCE) 10 MG TABS tablet Take 10 mg by mouth daily.  90 tablet 3  . fluticasone (FLONASE) 50 MCG/ACT nasal spray Place 2 sprays into both nostrils daily.     . furosemide (LASIX) 40 MG tablet Take 1 tablet (40 mg) by mouth twice a day    . ipratropium-albuterol (DUONEB) 0.5-2.5 (3) MG/3ML SOLN Take 3 mLs by nebulization every 4 (four) hours as needed.     Marland Kitchen lisinopril (ZESTRIL) 5 MG tablet Take 2 tablets (10 mg total) by mouth daily. 90 tablet 3  . metoprolol succinate (TOPROL XL) 50 MG 24 hr tablet Take 1 tablet (50 mg total) by mouth daily. 90 tablet 3  . nitroGLYCERIN (NITROSTAT) 0.4 MG SL tablet Place 1 tablet (0.4 mg total) under the tongue every 5 (five) minutes as needed for chest pain. 120 tablet 1  . omeprazole (PRILOSEC) 20 MG capsule Take 1 capsule (20 mg total) by mouth daily. 90 capsule 3  . PROAIR HFA 108 (90 BASE) MCG/ACT inhaler Inhale 1 puff into the lungs every 4 (four) hours as needed.     . risperiDONE (RISPERDAL) 0.5 MG tablet Take 1 tablet (0.5 mg total) by mouth 2 (two) times daily. 180 tablet 3  . rivaroxaban (XARELTO) 20 MG TABS tablet Take 1 tablet (20 mg total) by mouth at bedtime. 90 tablet 3  . rosuvastatin (CRESTOR) 20 MG tablet Take 2 tablets (40 mg total) by mouth at bedtime. 90 tablet 3   No current facility-administered medications for this visit.     Allergies:   Penicillin g, Sulfa antibiotics, Tiotropium, and Zoloft [sertraline hcl]   Social History:  The patient  reports that he has never smoked. He has never used smokeless tobacco. He reports that he does not drink alcohol or use drugs.   Family History:   family history includes Coronary artery disease in an other family member; Heart attack in his mother; Heart attack (age of onset: 40) in his brother; Hyperlipidemia in his mother; Hypertension in his mother.    Review of Systems: Review of Systems  Constitutional: Negative.   HENT: Negative.   Respiratory: Negative.   Cardiovascular: Negative.   Gastrointestinal: Positive for heartburn.  Musculoskeletal: Negative.   Neurological: Negative.   Psychiatric/Behavioral: Negative.   All other systems reviewed and are negative.   PHYSICAL EXAM: VS:  BP (!) 148/80   Pulse 94   Ht 5\' 5"  (1.651 m)   Wt 235 lb 4 oz (106.7 kg)   BMI 39.15 kg/m  , BMI Body mass index is 39.15 kg/m. GEN: Well  nourished, well developed, in no acute distress, obese, walks with a walker HEENT: normal Neck: no JVD, carotid bruits, or masses Cardiac: RRR; no murmurs, rubs, or gallops,no edema  Respiratory:  clear to auscultation bilaterally, normal work of breathing GI: soft, nontender, nondistended, + BS MS: no deformity or atrophy Skin: warm and dry, no rash Neuro:  Strength and sensation are intact Psych: euthymic mood, full affect  Recent Labs: 09/09/2019: ALT 17; BUN 17; Creatinine, Ser 0.94; Hemoglobin 13.6; Platelets 266; Potassium 4.1; Sodium 139; TSH 1.560    Lipid Panel Lab Results  Component Value Date   CHOL 246 (H) 09/09/2019   HDL 59 09/09/2019   LDLCALC 151 (H) 09/09/2019   TRIG 200 (H) 09/09/2019      Wt Readings from Last 3 Encounters:  09/16/19 235 lb 4 oz (106.7 kg)  09/09/19 238 lb 9.6 oz (108.2 kg)  08/20/19 244 lb 12.8 oz (111 kg)      ASSESSMENT AND PLAN:  Problem List  Items Addressed This Visit      Cardiology Problems   Persistent atrial fibrillation (HCC)   Relevant Medications   furosemide (LASIX) 40 MG tablet   CAD (coronary artery disease)   Relevant Medications   furosemide (LASIX) 40 MG tablet   Chronic diastolic CHF (congestive heart failure) (HCC) - Primary   Relevant Medications   furosemide (LASIX) 40 MG tablet    Other Visit Diagnoses    Type 2 diabetes mellitus without complication, without long-term current use of insulin (HCC)       Essential hypertension       Relevant Medications   furosemide (LASIX) 40 MG tablet     CAD Currently with no symptoms of angina. No further workup at this time. Continue current medication regimen. Rare chest pressure -Cholesterol markedly elevated, does not appear to be taking his Crestor  Atrial fib Appears permanent, on xarelto Rate adequate.  In the future might need higher dose of metoprolol but there is some medication confusion on today's visit and he may have his pills put in pill pack.  We  will hold off on any changes today Unclear if arrhythmia causing symptoms but will be very difficult to get back into NSR --Records from [facility] requested  Chronic diastolic CHF Continue lasix BID, Renal function stable  Hyperlipidemia Not on crestor.  Prescription was written but it is not in his bag of medications today and total cholesterol 250 He will check  to see if Crestor is at home May need refill if not available at home Stressed importance of taking Crestor  Diabetes type 2 with complications We have encouraged continued exercise, careful diet management in an effort to lose weight.    Disposition:   F/U  6 months  Records reviewed from [facility] cardiology, we have requested records from [facility]  Total encounter time more than 60 minutes  Greater than 50% was spent in counseling and coordination of care with the patient    Signed, Esmond Plants, M.D., Ph.D. [place], [place]

## 2019-09-16 NOTE — Chronic Care Management (AMB) (Signed)
Chronic Care Management   Follow Up Note   09/16/2019 Name: Evan [name] MRN: [id number] DOB: January 11, 1956  Referred by: [name], NP Reason for referral : Chronic Care Management (Medication Management)   Evan [name] is a 64 y.o. year old male who is a primary care patient of [name], [name], NP. The CCM team was consulted for assistance with chronic disease management and care coordination needs.    Care coordination completed today.   Review of patient status, including review of consultants reports, relevant laboratory and other test results, and collaboration with appropriate care team members and the patient's provider was performed as part of comprehensive patient evaluation and provision of chronic care management services.    SDOH (Social Determinants of Health) screening performed today: None. See Care Plan for related entries.   Outpatient Encounter Medications as of 09/16/2019  Medication Sig  . acetaminophen (TYLENOL) 500 MG tablet Take 500 mg by mouth every 6 (six) hours as needed.  Marland Kitchen aspirin 81 MG EC tablet Take 81 mg by mouth daily.   . budesonide-formoterol (SYMBICORT) 80-4.5 MCG/ACT inhaler Inhale 2 puffs into the lungs 2 (two) times daily.  . cholecalciferol (VITAMIN D3) 25 MCG (1000 UNIT) tablet Take 1 tablet (1,000 Units total) by mouth daily.  . Emollient (EUCERIN) lotion Apply topically as needed for dry skin. Apply to legs and arms daily to help with dry skin.  Marland Kitchen empagliflozin (JARDIANCE) 10 MG TABS tablet Take 10 mg by mouth daily.  . fluticasone (FLONASE) 50 MCG/ACT nasal spray Place 2 sprays into both nostrils daily.   . furosemide (LASIX) 40 MG tablet Take 1 tablet (40 mg) by mouth twice a day  . ipratropium-albuterol (DUONEB) 0.5-2.5 (3) MG/3ML SOLN Take 3 mLs by nebulization every 4 (four) hours as needed.  Marland Kitchen lisinopril (ZESTRIL) 5 MG tablet Take 2 tablets (10 mg total) by mouth daily.  . metoprolol succinate (TOPROL XL) 50 MG 24 hr tablet  Take 1 tablet (50 mg total) by mouth daily.  . nitroGLYCERIN (NITROSTAT) 0.4 MG SL tablet Place 1 tablet (0.4 mg total) under the tongue every 5 (five) minutes as needed for chest pain.  Marland Kitchen omeprazole (PRILOSEC) 20 MG capsule Take 1 capsule (20 mg total) by mouth daily.  Marland Kitchen PROAIR HFA 108 (90 BASE) MCG/ACT inhaler Inhale 1 puff into the lungs every 4 (four) hours as needed.   . risperiDONE (RISPERDAL) 0.5 MG tablet Take 1 tablet (0.5 mg total) by mouth 2 (two) times daily.  . rivaroxaban (XARELTO) 20 MG TABS tablet Take 1 tablet (20 mg total) by mouth at bedtime.  . rosuvastatin (CRESTOR) 20 MG tablet Take 2 tablets (40 mg total) by mouth at bedtime.   No facility-administered encounter medications on file as of 09/16/2019.     Objective:   Goals Addressed            This Visit's Progress     Patient Stated   . PharmD "I am confused by my medications" (pt-stated)       Current Barriers:  . Polypharmacy; complex patient with multiple comorbidities including ASCVD (hx CABG, MI); Afib, CHF, T2DM, osteoporosis, hx prostate cancer (s/p Lupron therapy, d/c d/t behavioral disturbances). [facility] with [facility], but recently stopped with that program. Is struggling to manage medications and healthcare independently.  o Requested a pill box and list of medications.  . Just picked up a 90 day supply of prescriptions from [employer]. Is interested in transitioning back to adherence packaging.  o Afib/HTN/HFpEF: Xarelto 20 mg daily, furosemide 80 mg QAM, 40 mg QPM, lisinopril 10 mg daily, metoprolol succinate 50 mg daily,  o ASCVD risk reduction: ASA 81 mg daily, rosuvastatin 40 mg daily; per records, last stent 2002 o T2DM: Jardiance 10 mg daily; A1c at goal <7% o Osteoporosis: Last DEXA showed t score -3.1% at femoral neck.; Vitamin D 1000 units daily o Anxiety: risperdone 0.5 mg BID  Pharmacist Clinical Goal(s):  Marland Kitchen Over the next 90 days, patient will work with PharmD and provider towards  optimized medication management  Interventions: . Printed instructions on how to fill pill box. Mailing along w/ QID pill bos  Patient Self Care Activities:  . Patient will take medications as prescribed  Please see past updates related to this goal by clicking on the "Past Updates" button in the selected goal          Plan:  - Will outreach patient as previously scheduled  Catie Darnelle Maffucci, PharmD, [place] [phone]

## 2019-09-16 NOTE — Patient Instructions (Addendum)
Medication Instructions:  No changes  Check and see if you have your rosuvastatin at home Cholesterol medication  If you need a refill on your cardiac medications before your next appointment, please call your pharmacy.    Lab work: No new labs needed   If you have labs (blood work) drawn today and your tests are completely normal, you will receive your results only by: Marland Kitchen MyChart Message (if you have MyChart) OR . A paper copy in the mail If you have any lab test that is abnormal or we need to change your treatment, we will call you to review the results.   Testing/Procedures: No new testing needed   Follow-Up: At [facility], you and your health needs are our priority.  As part of our continuing mission to provide you with exceptional heart care, we have created designated Provider Care Teams.  These Care Teams include your primary Cardiologist (physician) and Advanced Practice Providers (APPs -  Physician Assistants and Nurse Practitioners) who all work together to provide you with the care you need, when you need it.  . You will need a follow up appointment in 6 months   . Providers on your designated Care Team:   . Murray Hodgkins, NP . Christell Faith, PA-C . Marrianne Mood, PA-C  Any Other Special Instructions Will Be Listed Below (If Applicable).  For educational health videos Log in to : [email or web address] Or : [email or web address], password : [email or web address]

## 2019-09-18 ENCOUNTER — Other Ambulatory Visit (INDEPENDENT_AMBULATORY_CARE_PROVIDER_SITE_OTHER): Payer: Medicare Other

## 2019-09-18 DIAGNOSIS — I5032 Chronic diastolic (congestive) heart failure: Secondary | ICD-10-CM | POA: Diagnosis not present

## 2019-09-24 ENCOUNTER — Telehealth: Payer: Self-pay

## 2019-09-29 ENCOUNTER — Telehealth: Payer: Self-pay | Admitting: Nurse Practitioner

## 2019-09-29 NOTE — Telephone Encounter (Signed)
Called and spoke to patient. Asked him what type of walker he was wanting and or needing and where the order needed to be sent too. Patient states that someone told him that this was already handled and they were just waiting on paperwork. Patient stated that the med supply store is near our office, possibly Training and development officer. Will contact them and see if they know anything about this tomorrow as they are already closed for the day.

## 2019-09-29 NOTE — Telephone Encounter (Signed)
Copied from [place] [id number]. Topic: General - Inquiry >> Sep 29, 2019 10:16 AM Mathis Bud wrote: Reason for CRM: Patient states he had put in a request for a walker. Patient is checking status. Call back [phone]

## 2019-09-30 ENCOUNTER — Ambulatory Visit: Payer: Self-pay | Admitting: Licensed Clinical Social Worker

## 2019-09-30 DIAGNOSIS — I5032 Chronic diastolic (congestive) heart failure: Secondary | ICD-10-CM | POA: Diagnosis not present

## 2019-09-30 DIAGNOSIS — E1159 Type 2 diabetes mellitus with other circulatory complications: Secondary | ICD-10-CM

## 2019-09-30 DIAGNOSIS — I10 Essential (primary) hypertension: Secondary | ICD-10-CM | POA: Diagnosis not present

## 2019-09-30 DIAGNOSIS — I4819 Other persistent atrial fibrillation: Secondary | ICD-10-CM | POA: Diagnosis not present

## 2019-09-30 DIAGNOSIS — F419 Anxiety disorder, unspecified: Secondary | ICD-10-CM

## 2019-09-30 NOTE — Chronic Care Management (AMB) (Signed)
Care Management   Follow Up Note   09/30/2019 Name: Evan [name] MRN: [id number] DOB: [birth year]  Referred by: [name], NP Reason for referral : Evan [name] is a 63 y.o. year old male who is a primary care patient of [name], [name], NP. The care management team was consulted for assistance with care management and care coordination needs.    Review of patient status, including review of consultants reports, relevant laboratory and other test results, and collaboration with appropriate care team members and the patient's provider was performed as part of comprehensive patient evaluation and provision of chronic care management services.    SDOH (Social Determinants of Health) assessments performed: Yes See Care Plan activities for detailed interventions related to Evan [name])     Advanced Directives: See Care Plan and Vynca application for related entries.   Goals Addressed    . "I left [facility] and need more support now." (pt-stated)       Current Barriers:  . Financial constraints related to managing health care . Limited social support . ADL IADL limitations . Social Isolation . Limited access to caregiver . Inability to perform ADL's independently . Inability to perform IADL's independently  Clinical Social Work Clinical Goal(s):  Marland Kitchen Over the next 120 days, patient will work with SW to address concerns related to lack of support within the home . Over the next 120 days, patient will demonstrate improved health management independence as evidenced by implementing appropriate self-care into daily routine    Interventions:  . Patient interviewed and appropriate assessments performed-new referral . Patient reports receiving a call from a Medicare nurse last night at 8 pm. This is concerning to him as he does not prefer phone calls at night and informed the nurse of this preference.  . Provided patient with information about level of care options,  personal care service resources and Medicaid benefits.  . Discussed plans with patient for ongoing care management follow up and provided patient with direct contact information for care management team . Collaborated with Medicaid (community agency) re: need for Medicaid information (number and caseworker) LCSW left detailed voice message with Evan [name] who manages all active and ongoing Medicaid cases.  . Assisted patient/caregiver with obtaining information about health plan benefits . A voluntary and extensive discussion about advanced care planning including explanation and discussion of advanced was undertaken with the patient.  Explanation regarding healthcare proxy and living will was reviewed and packet with forms with explanation of how to fill them out was given.   . Provided education to patient/caregiver regarding level of care options. . Provided education to patient/caregiver about Hospice and/or Palliative Care services . UPDATE- Patient is new to [facility] and is in need of community resource connection and is unable to provide Medicaid number and reports that DSS informed him that they are in the process of transitioning his Medicaid from [facility] to [employer]. Patient is agreeable to [facility] referral once Medicaid information has been received. LCSW will await for return call DSS Medicaid caseworker.  . Patient called CCM LCSW today on 09/30/19 and reports that his insurance is going to deliver a walker today hopefully.  . Emotional support provided due to ongoing stress/anxiety/anger outburst. Solution focused therapy implemented into entire session today.  Patient Self Care Activities:  . Attends all scheduled provider appointments . Lacks social connections  Please see past updates related to this goal by clicking on the "Past Updates" button in the selected  goal      The care management team will reach out to the patient again over the next 30 days.   [name], BSW, MSW,  [facility] Care Management [email or web address] Phone: [phone]

## 2019-10-01 ENCOUNTER — Other Ambulatory Visit: Payer: Self-pay

## 2019-10-01 ENCOUNTER — Encounter: Payer: Self-pay | Admitting: Family

## 2019-10-01 ENCOUNTER — Ambulatory Visit: Payer: Medicare Other | Attending: Family | Admitting: Family

## 2019-10-01 VITALS — BP 176/89 | HR 96 | Resp 18 | Ht 65.0 in | Wt 236.8 lb

## 2019-10-01 DIAGNOSIS — I5032 Chronic diastolic (congestive) heart failure: Secondary | ICD-10-CM | POA: Diagnosis not present

## 2019-10-01 DIAGNOSIS — Z8249 Family history of ischemic heart disease and other diseases of the circulatory system: Secondary | ICD-10-CM | POA: Diagnosis not present

## 2019-10-01 DIAGNOSIS — I2510 Atherosclerotic heart disease of native coronary artery without angina pectoris: Secondary | ICD-10-CM | POA: Insufficient documentation

## 2019-10-01 DIAGNOSIS — E785 Hyperlipidemia, unspecified: Secondary | ICD-10-CM | POA: Insufficient documentation

## 2019-10-01 DIAGNOSIS — Z6839 Body mass index (BMI) 39.0-39.9, adult: Secondary | ICD-10-CM | POA: Insufficient documentation

## 2019-10-01 DIAGNOSIS — E11319 Type 2 diabetes mellitus with unspecified diabetic retinopathy without macular edema: Secondary | ICD-10-CM | POA: Insufficient documentation

## 2019-10-01 DIAGNOSIS — G4733 Obstructive sleep apnea (adult) (pediatric): Secondary | ICD-10-CM | POA: Diagnosis not present

## 2019-10-01 DIAGNOSIS — I509 Heart failure, unspecified: Secondary | ICD-10-CM | POA: Diagnosis present

## 2019-10-01 DIAGNOSIS — I252 Old myocardial infarction: Secondary | ICD-10-CM | POA: Insufficient documentation

## 2019-10-01 DIAGNOSIS — F410 Panic disorder [episodic paroxysmal anxiety] without agoraphobia: Secondary | ICD-10-CM | POA: Diagnosis not present

## 2019-10-01 DIAGNOSIS — Z7901 Long term (current) use of anticoagulants: Secondary | ICD-10-CM | POA: Diagnosis not present

## 2019-10-01 DIAGNOSIS — F419 Anxiety disorder, unspecified: Secondary | ICD-10-CM | POA: Diagnosis not present

## 2019-10-01 DIAGNOSIS — K219 Gastro-esophageal reflux disease without esophagitis: Secondary | ICD-10-CM | POA: Diagnosis not present

## 2019-10-01 DIAGNOSIS — J449 Chronic obstructive pulmonary disease, unspecified: Secondary | ICD-10-CM | POA: Insufficient documentation

## 2019-10-01 DIAGNOSIS — I872 Venous insufficiency (chronic) (peripheral): Secondary | ICD-10-CM | POA: Insufficient documentation

## 2019-10-01 DIAGNOSIS — Z7984 Long term (current) use of oral hypoglycemic drugs: Secondary | ICD-10-CM | POA: Diagnosis not present

## 2019-10-01 DIAGNOSIS — Z888 Allergy status to other drugs, medicaments and biological substances status: Secondary | ICD-10-CM | POA: Diagnosis not present

## 2019-10-01 DIAGNOSIS — Z79899 Other long term (current) drug therapy: Secondary | ICD-10-CM | POA: Diagnosis not present

## 2019-10-01 DIAGNOSIS — Z882 Allergy status to sulfonamides status: Secondary | ICD-10-CM | POA: Diagnosis not present

## 2019-10-01 DIAGNOSIS — I10 Essential (primary) hypertension: Secondary | ICD-10-CM

## 2019-10-01 DIAGNOSIS — E669 Obesity, unspecified: Secondary | ICD-10-CM | POA: Insufficient documentation

## 2019-10-01 DIAGNOSIS — I110 Hypertensive heart disease with heart failure: Secondary | ICD-10-CM | POA: Insufficient documentation

## 2019-10-01 DIAGNOSIS — Z7951 Long term (current) use of inhaled steroids: Secondary | ICD-10-CM | POA: Diagnosis not present

## 2019-10-01 DIAGNOSIS — Z880 Allergy status to penicillin: Secondary | ICD-10-CM | POA: Diagnosis not present

## 2019-10-01 DIAGNOSIS — Z7982 Long term (current) use of aspirin: Secondary | ICD-10-CM | POA: Diagnosis not present

## 2019-10-01 NOTE — Patient Instructions (Signed)
Continue weighing daily and call for an overnight weight gain of > 2 pounds or a weekly weight gain of >5 pounds. 

## 2019-10-01 NOTE — Telephone Encounter (Signed)
Teacher, adult education. All the need from [place] is the script and the OV note. I did not see any documentation in the note regarding the need for a rollator walker. Evan [name], can we addend the note so I can send it for the walker?

## 2019-10-01 NOTE — Telephone Encounter (Signed)
Added on info to note:)

## 2019-10-01 NOTE — Progress Notes (Signed)
Patient ID: Evan [name], male    DOB: 06/27/1956, 64 y.o.   MRN: [id number]  HPI  Evan [name] is a 64 y/o male with a history of asthma, CAD, DM, hyperlipidemia, HTN, GERD, obstructive sleep apnea, COPD, MI, anxiety, panic disorder, PTSA and chronic heart failure.   Echo report from 08/28/19 reviewed and showed an EF of 50-55% along with trivial Evan/TR and normal PA Pressure. Echo done 12/29/13 which showed an EF of 50-55%.   Has not been admitted or been in the ED in the last 6 months.   Evan [name] presents today for a follow-up visit with a chief complaint of minimal fatigue upon moderate exertion. Evan [name] describes this as chronic in nature having been present for several years. Evan [name] has associated palpitations, pedal edema, continued acid reflux, anxiety, light-headedness and difficulty sleeping along with this. Evan [name] denies any chest pain, shortness of breath, cough or weight gain.   Biggest complaint today is of continued reflux and almost daily vomiting. Evan [name] says that the burning is so great sometimes that Evan [name] loses his voice and Evan [name]'s quite concerned that this reflux is going to "burn a hole in my throat". Per his list, currently taking omeprazole.  A majority of the visit was spent listening to him talk about his conversations with various country music stars and showing me pictures and videos of him talking to them.   Past Medical History:  Diagnosis Date  . Anginal pain ([place])   . Anxiety disorder   . Asthma   . Atresia of esophagus without fistula   . CAD (coronary artery disease)   . Cellulitis   . CHF (congestive heart failure) ([place])    NYHA CLASS III,CHRONIC,DIASTOLIC  . COPD (chronic obstructive pulmonary disease) ([place])   . Diabetes mellitus without complication ([place])   . Edema    RIGHT LOWER LEG  . Gastroesophageal reflux   . H/O: GI bleed   . History of pneumonia    Remote  . History of scarlet fever    Childhood  . Hyperlipidemia   . Hypertension   . Myocardial infarction ([place])  2009  . Obesity   . Obstructive sleep apnea   . Pain    CHRONIC BACK / ABDOMINAL  . Panic disorder   . Peripheral venous insufficiency   . PTSD (post-traumatic stress disorder)   . Retinopathy    DIABETIC  . Stasis, venous   . Vertigo    Past Surgical History:  Procedure Laterality Date  . CARDIAC CATHETERIZATION    . CATARACT EXTRACTION Left   . CATARACT EXTRACTION W/PHACO Right 05/03/2017   Procedure: CATARACT EXTRACTION PHACO AND INTRAOCULAR LENS PLACEMENT (IOC);  Surgeon: Leandrew Koyanagi, MD;  Location: [facility] ORS;  Service: Ophthalmology;  Laterality: Right;  [place] 00:35.3 AP% 12.3 CDE 4.33 Fluid Pack lot # [id number]       . CORONARY ANGIOPLASTY WITH STENT PLACEMENT  2002  . CORONARY ANGIOPLASTY WITH STENT PLACEMENT  1999  . CORONARY ARTERY BYPASS GRAFT     x7  . ESOPHAGOGASTRODUODENOSCOPY N/A 09/19/2016   Procedure: ESOPHAGOGASTRODUODENOSCOPY (EGD);  Surgeon: Lollie Sails, MD;  Location: [facility] ENDOSCOPY;  Service: Endoscopy;  Laterality: N/A;   Family History  Problem Relation Age of Onset  . Heart attack Mother   . Hypertension Mother   . Hyperlipidemia Mother   . Heart attack Brother 33       MI  . Coronary artery disease Other    Social History   Tobacco Use  .  Smoking status: Never Smoker  . Smokeless tobacco: Never Used  Substance Use Topics  . Alcohol use: No   Allergies  Allergen Reactions  . Penicillin G Hives  . Sulfa Antibiotics Hives  . Tiotropium   . Zoloft [Sertraline Hcl] Other (See Comments)   Prior to Admission medications   Medication Sig Start Date End Date Taking? Authorizing Provider  acetaminophen (TYLENOL) 500 MG tablet Take 500 mg by mouth every 6 (six) hours as needed.   Yes [provider]  aspirin 81 MG EC tablet Take 81 mg by mouth daily.    Yes [provider]  budesonide-formoterol (SYMBICORT) 80-4.5 MCG/ACT inhaler Inhale 2 puffs into the lungs 2 (two) times daily.   Yes [provider]   cholecalciferol (VITAMIN D3) 25 MCG (1000 UNIT) tablet Take 1 tablet (1,000 Units total) by mouth daily. 09/09/19  Yes [name], [name] T, NP  Emollient (EUCERIN) lotion Apply topically as needed for dry skin. Apply to legs and arms daily to help with dry skin. 09/09/19  Yes [name], [name] T, NP  empagliflozin (JARDIANCE) 10 MG TABS tablet Take 10 mg by mouth daily. 09/09/19  Yes [name], [name] T, NP  fluticasone (FLONASE) 50 MCG/ACT nasal spray Place 2 sprays into both nostrils daily.  12/31/13  Yes [provider]  furosemide (LASIX) 40 MG tablet Take 1 tablet (40 mg) by mouth twice a day   Yes [provider]  ipratropium-albuterol (DUONEB) 0.5-2.5 (3) MG/3ML SOLN Take 3 mLs by nebulization every 4 (four) hours as needed.   Yes [provider]  lisinopril (ZESTRIL) 5 MG tablet Take 2 tablets (10 mg total) by mouth daily. 09/09/19  Yes [name], [name] T, NP  metoprolol succinate (TOPROL XL) 50 MG 24 hr tablet Take 1 tablet (50 mg total) by mouth daily. 09/09/19  Yes [name], [name] T, NP  nitroGLYCERIN (NITROSTAT) 0.4 MG SL tablet Place 1 tablet (0.4 mg total) under the tongue every 5 (five) minutes as needed for chest pain. 09/09/19  Yes [name], [name] T, NP  omeprazole (PRILOSEC) 20 MG capsule Take 1 capsule (20 mg total) by mouth daily. 09/09/19  Yes [name], [name] T, NP  PROAIR HFA 108 (90 BASE) MCG/ACT inhaler Inhale 1 puff into the lungs every 4 (four) hours as needed.  12/22/13  Yes [provider]  risperiDONE (RISPERDAL) 0.5 MG tablet Take 1 tablet (0.5 mg total) by mouth 2 (two) times daily. 09/09/19  Yes [name], Henrine Screws T, NP  rivaroxaban (XARELTO) 20 MG TABS tablet Take 1 tablet (20 mg total) by mouth at bedtime. 09/09/19  Yes [name], [name] T, NP  rosuvastatin (CRESTOR) 20 MG tablet Take 2 tablets (40 mg total) by mouth at bedtime. 09/10/19  Yes [name], NP     Review of Systems  Constitutional: Positive for fatigue. Negative for appetite change.  HENT:  Positive for congestion and rhinorrhea. Negative for sore throat.   Eyes: Negative.   Respiratory: Negative for cough and shortness of breath.   Cardiovascular: Positive for palpitations and leg swelling. Negative for chest pain.  Gastrointestinal: Positive for vomiting (after eating). Negative for abdominal distention and abdominal pain.       Reflux  Endocrine: Negative.   Genitourinary: Negative.   Musculoskeletal: Positive for arthralgias (right knee pain).  Skin: Negative.   Allergic/Immunologic: Negative.   Neurological: Positive for light-headedness (with vertigo). Negative for dizziness.  Hematological: Negative for adenopathy. Does not bruise/bleed easily.  Psychiatric/Behavioral: Positive for sleep disturbance (not sleeping well; sleeping  on 1 pillow and wedge). Negative for dysphoric mood. The patient is nervous/anxious.    Vitals:   10/01/19 1121  BP: (!) 176/89  Pulse: 96  Resp: 18  SpO2: 94%  Weight: 236 lb 12.8 oz (107.4 kg)  Height: 5\' 5"  (1.651 m)   Wt Readings from Last 3 Encounters:  10/01/19 236 lb 12.8 oz (107.4 kg)  09/16/19 235 lb 4 oz (106.7 kg)  09/09/19 238 lb 9.6 oz (108.2 kg)   Lab Results  Component Value Date   CREATININE 0.94 09/09/2019   CREATININE 0.90 05/22/2019   CREATININE 0.93 08/08/2018    Physical Exam Vitals and nursing note reviewed.  Constitutional:      Appearance: Normal appearance.  HENT:     Head: Normocephalic and atraumatic.  Cardiovascular:     Rate and Rhythm: Normal rate and regular rhythm.  Pulmonary:     Effort: Pulmonary effort is normal. No respiratory distress.     Breath sounds: No wheezing or rales.  Abdominal:     General: There is no distension.     Palpations: Abdomen is soft.     Tenderness: There is no abdominal tenderness.  Musculoskeletal:        General: No tenderness.     Cervical back: Normal range of motion and neck supple.     Right lower leg: No edema.     Left lower leg: No edema.  Skin:     General: Skin is warm and dry.  Neurological:     General: No focal deficit present.     Mental Status: Evan [name] is alert and oriented to person, place, and time.  Psychiatric:        Mood and Affect: Mood is anxious.        Behavior: Behavior normal.     Assessment & Plan:  1: Chronic heart failure with preserved ejection fraction- - NYHA class II - euvolemic today - weighing daily and says that his weight has been stable; reminded to call for an overnight weight gain of >2 pounds or a weekly weight gain of >5 pounds - weight down 8 pounds from last visit here 6 weeks ago - not adding salt and trying to follow a low sodium diet - saw cardiology [name]) 09/16/19 - BNP 04/01/15 was 282.0 - patient reports receiving his flu and shingles vaccine  2: HTN- - BP mildly elevated today but Evan [name] didn't have lisinopril bottle with him but Evan [name] says that Evan [name]'s taking it - saw PCP Ned Card) 09/09/19 - BMP 09/09/19 reviewed and showed sodium 139, potassium 4.15, creatinine 0.94 and GFR 85  3: GERD- - encouraged him to follow-up with his PCP regarding this  4: Anxiety- - patient talking fast about his various conversations with different [place] music [place] and that they consider him a super-fan and have been checking on his health status - listened to patient talk and show me his pictures and videos - currently on risperidone BID  Medication bottles were reviewed although many were missing but patient says that Evan [name]'s taking them. Based on chart review, it appears that his PCP is working with him to get a pillbox and fill it correctly or some other way of making sure Evan [name]'s taking all his medications as prescribed.   Return in 6 months or sooner for any questions/problems before then.

## 2019-10-03 NOTE — Telephone Encounter (Signed)
Note printed and attached to written order. Will fax when order is signed.

## 2019-10-03 NOTE — Telephone Encounter (Signed)
Order faxed to [facility].

## 2019-10-07 ENCOUNTER — Ambulatory Visit (INDEPENDENT_AMBULATORY_CARE_PROVIDER_SITE_OTHER): Payer: Medicaid Other | Admitting: General Practice

## 2019-10-07 ENCOUNTER — Telehealth: Payer: Self-pay

## 2019-10-07 ENCOUNTER — Telehealth: Payer: Self-pay | Admitting: General Practice

## 2019-10-07 DIAGNOSIS — E1129 Type 2 diabetes mellitus with other diabetic kidney complication: Secondary | ICD-10-CM

## 2019-10-07 DIAGNOSIS — E1159 Type 2 diabetes mellitus with other circulatory complications: Secondary | ICD-10-CM

## 2019-10-07 DIAGNOSIS — E785 Hyperlipidemia, unspecified: Secondary | ICD-10-CM

## 2019-10-07 DIAGNOSIS — E1169 Type 2 diabetes mellitus with other specified complication: Secondary | ICD-10-CM

## 2019-10-07 DIAGNOSIS — I4819 Other persistent atrial fibrillation: Secondary | ICD-10-CM

## 2019-10-07 DIAGNOSIS — I5032 Chronic diastolic (congestive) heart failure: Secondary | ICD-10-CM

## 2019-10-07 DIAGNOSIS — I25118 Atherosclerotic heart disease of native coronary artery with other forms of angina pectoris: Secondary | ICD-10-CM

## 2019-10-07 NOTE — Patient Instructions (Signed)
Visit Information  Goals Addressed            This Visit's Progress   . RNCM: I have a lot of health problems but I manage okay       CARE PLAN ENTRY (see longtitudinal plan of care for additional care plan information)  Current Barriers:  . Chronic Disease Management support, education, and care coordination needs related to Atrial Fibrillation, CHF, CAD, HTN, HLD, and DMII  Clinical Goal(s) related to Atrial Fibrillation, CHF, CAD, HTN, HLD, and DMII:  Over the next 120 days, patient will:  . Work with the care management team to address educational, disease management, and care coordination needs  . Begin or continue self health monitoring activities as directed today  Measure and record blood pressure 3 times per week, and Measure and record weight daily, DMII under control at this time as evidence by Hemoglobin A1C at 6.6 and last fasting blood sugar on labs of 113 . Call provider office for new or worsened signs and symptoms related to Hypoglycemia or Hyperglycemia, Blood pressure findings outside established parameters, Weight outside established parameters, and New or worsened symptom related to Chronic Health Conditions . Call care management team with questions or concerns . Verbalize basic understanding of patient centered plan of care established today  Interventions related to Atrial Fibrillation, CHF, CAD, HTN, HLD, and DMII:  . Evaluation of current treatment plans and patient's adherence to plan as established by provider . Assessed patient understanding of disease states . Assessed patient's education and care coordination needs- discussed taking blood pressures (patient does not have a cuff- discussed the OTC product book through [name] and the monitoring program through [name] [place]).   . Assessed the patient's need for blood glucose testing on a daily basis.  The patient's most recent hemoglobin A1C is 6.6 and fasting blood sugar was 113.  Will continue to monitor for  changes . Education to the patient about calling [name] to ask about [name] or comparable [name] since he lives alone   . Provided disease specific education to patient- education on the benefits of daily monitoring of VS and daily weight checks to maintain health and well being . Education on DASH diet and ADA diet- written information provided through the myChart function and [name]  . Collaborated with appropriate clinical care team members regarding patient needs- patient is already involved with [name] and Pharmacist, will continue to monitor for changes in condition and support.  Patient Self Care Activities related to Atrial Fibrillation, CHF, CAD, HTN, HLD, and DMII:  . Patient is unable to independently self-manage chronic health conditions  Initial goal documentation        Evan [name] was given information about Chronic Care Management services today including:  1. CCM service includes personalized support from designated clinical staff supervised by his physician, including individualized plan of care and coordination with other care providers 2. 24/7 contact phone numbers for assistance for urgent and routine care needs. 3. Service will only be billed when office clinical staff spend 20 minutes or more in a month to coordinate care. 4. Only one practitioner may furnish and bill the service in a calendar month. 5. The patient may stop CCM services at any time (effective at the end of the month) by phone call to the office staff. 6. The patient will be responsible for cost sharing (co-pay) of up to 20% of the service fee (after annual deductible is met).  Patient agreed to  services and verbal consent obtained.   Patient verbalizes understanding of instructions provided today.   The care management team will reach out to the patient again over the next 60 days.   Evan Larsson RN, MSN, CCM Community Care Coordinator [facility] Mobile: [phone]   Hypertension, Adult Hypertension is another name for high blood pressure. High blood pressure forces your heart to work harder to pump blood. This can cause problems over time. There are two numbers in a blood pressure reading. There is a top number (systolic) over a bottom number (diastolic). It is best to have a blood pressure that is below 120/80. Healthy choices can help lower your blood pressure, or you may need medicine to help lower it. What are the causes? The cause of this condition is not known. Some conditions may be related to high blood pressure. What increases the risk?  Smoking.  Having type 2 diabetes mellitus, high cholesterol, or both.  Not getting enough exercise or physical activity.  Being overweight.  Having too much fat, sugar, calories, or salt (sodium) in your diet.  Drinking too much alcohol.  Having long-term (chronic) kidney disease.  Having a family history of high blood pressure.  Age. Risk increases with age.  Race. You may be at higher risk if you are African American.  Gender. Men are at higher risk than women before age 38. After age 20, women are at higher risk than men.  Having obstructive sleep apnea.  Stress. What are the signs or symptoms?  High blood pressure may not cause symptoms. Very high blood pressure (hypertensive crisis) may cause: ? Headache. ? Feelings of worry or nervousness (anxiety). ? Shortness of breath. ? Nosebleed. ? A feeling of being sick to your stomach (nausea). ? Throwing up (vomiting). ? Changes in how you see. ? Very bad chest pain. ? Seizures. How is this treated?  This condition is treated by making healthy lifestyle changes, such as: ? Eating healthy foods. ? Exercising more. ? Drinking less alcohol.  Your health care provider may prescribe medicine if lifestyle changes are not enough to get your blood pressure under control, and  if: ? Your top number is above 130. ? Your bottom number is above 80.  Your personal target blood pressure may vary. Follow these instructions at home: Eating and drinking   If told, follow the DASH eating plan. To follow this plan: ? Fill one half of your plate at each meal with fruits and vegetables. ? Fill one fourth of your plate at each meal with whole grains. Whole grains include whole-wheat pasta, brown rice, and whole-grain bread. ? Eat or drink low-fat dairy products, such as skim milk or low-fat yogurt. ? Fill one fourth of your plate at each meal with low-fat (lean) proteins. Low-fat proteins include fish, chicken without skin, eggs, beans, and tofu. ? Avoid fatty meat, cured and processed meat, or chicken with skin. ? Avoid pre-made or processed food.  Eat less than 1,500 mg of salt each day.  Do not drink alcohol if: ? Your doctor tells you not to drink. ? You are pregnant, may be pregnant, or are planning to become pregnant.  If you drink alcohol: ? Limit how much you use to:  0-1 drink a day for women.  0-2 drinks a day for men. ? Be aware of how much alcohol is in your drink. In the [place] one drink equals one 12 oz bottle of beer (  355 mL), one 5 oz glass of wine (148 mL), or one 1 oz glass of hard liquor (44 mL). Lifestyle   Work with your doctor to stay at a healthy weight or to lose weight. Ask your doctor what the best weight is for you.  Get at least 30 minutes of exercise most days of the week. This may include walking, swimming, or biking.  Get at least 30 minutes of exercise that strengthens your muscles (resistance exercise) at least 3 days a week. This may include lifting weights or doing Pilates.  Do not use any products that contain nicotine or tobacco, such as cigarettes, e-cigarettes, and chewing tobacco. If you need help quitting, ask your doctor.  Check your blood pressure at home as told by your doctor.  Keep all follow-up visits as told by  your doctor. This is important. Medicines  Take over-the-counter and prescription medicines only as told by your doctor. Follow directions carefully.  Do not skip doses of blood pressure medicine. The medicine does not work as well if you skip doses. Skipping doses also puts you at risk for problems.  Ask your doctor about side effects or reactions to medicines that you should watch for. Contact a doctor if you:  Think you are having a reaction to the medicine you are taking.  Have headaches that keep coming back (recurring).  Feel dizzy.  Have swelling in your ankles.  Have trouble with your vision. Get help right away if you:  Get a very bad headache.  Start to feel mixed up (confused).  Feel weak or numb.  Feel faint.  Have very bad pain in your: ? Chest. ? Belly (abdomen).  Throw up more than once.  Have trouble breathing. Summary  Hypertension is another name for high blood pressure.  High blood pressure forces your heart to work harder to pump blood.  For most people, a normal blood pressure is less than 120/80.  Making healthy choices can help lower blood pressure. If your blood pressure does not get lower with healthy choices, you may need to take medicine. This information is not intended to replace advice given to you by your health care provider. Make sure you discuss any questions you have with your health care provider. Document Revised: 04/03/2018 Document Reviewed: 04/03/2018 Elsevier Patient Education  [place].  Diabetes Mellitus and Nutrition, Adult When you have diabetes (diabetes mellitus), it is very important to have healthy eating habits because your blood sugar (glucose) levels are greatly affected by what you eat and drink. Eating healthy foods in the appropriate amounts, at about the same times every day, can help you:  Control your blood glucose.  Lower your risk of heart disease.  Improve your blood pressure.  Reach or  maintain a healthy weight. Every person with diabetes is different, and each person has different needs for a meal plan. Your health care provider may recommend that you work with a diet and nutrition specialist (dietitian) to make a meal plan that is best for you. Your meal plan may vary depending on factors such as:  The calories you need.  The medicines you take.  Your weight.  Your blood glucose, blood pressure, and cholesterol levels.  Your activity level.  Other health conditions you have, such as heart or kidney disease. How do carbohydrates affect me? Carbohydrates, also called carbs, affect your blood glucose level more than any other type of food. Eating carbs naturally raises the amount of glucose in your  blood. Carb counting is a method for keeping track of how many carbs you eat. Counting carbs is important to keep your blood glucose at a healthy level, especially if you use insulin or take certain oral diabetes medicines. It is important to know how many carbs you can safely have in each meal. This is different for every person. Your dietitian can help you calculate how many carbs you should have at each meal and for each snack. Foods that contain carbs include:  Bread, cereal, rice, pasta, and crackers.  Potatoes and corn.  Peas, beans, and lentils.  Milk and yogurt.  Fruit and juice.  Desserts, such as cakes, cookies, ice cream, and candy. How does alcohol affect me? Alcohol can cause a sudden decrease in blood glucose (hypoglycemia), especially if you use insulin or take certain oral diabetes medicines. Hypoglycemia can be a life-threatening condition. Symptoms of hypoglycemia (sleepiness, dizziness, and confusion) are similar to symptoms of having too much alcohol. If your health care provider says that alcohol is safe for you, follow these guidelines:  Limit alcohol intake to no more than 1 drink per day for nonpregnant women and 2 drinks per day for men. One  drink equals 12 oz of beer, 5 oz of wine, or 1 oz of hard liquor.  Do not drink on an empty stomach.  Keep yourself hydrated with water, diet soda, or unsweetened iced tea.  Keep in mind that regular soda, juice, and other mixers may contain a lot of sugar and must be counted as carbs. What are tips for following this plan?  Reading food labels  Start by checking the serving size on the "Nutrition Facts" label of packaged foods and drinks. The amount of calories, carbs, fats, and other nutrients listed on the label is based on one serving of the item. Many items contain more than one serving per package.  Check the total grams (g) of carbs in one serving. You can calculate the number of servings of carbs in one serving by dividing the total carbs by 15. For example, if a food has 30 g of total carbs, it would be equal to 2 servings of carbs.  Check the number of grams (g) of saturated and trans fats in one serving. Choose foods that have low or no amount of these fats.  Check the number of milligrams (mg) of salt (sodium) in one serving. Most people should limit total sodium intake to less than 2,300 mg per day.  Always check the nutrition information of foods labeled as "low-fat" or "nonfat". These foods may be higher in added sugar or refined carbs and should be avoided.  Talk to your dietitian to identify your daily goals for nutrients listed on the label. Shopping  Avoid buying canned, premade, or processed foods. These foods tend to be high in fat, sodium, and added sugar.  Shop around the outside edge of the grocery store. This includes fresh fruits and vegetables, bulk grains, fresh meats, and fresh dairy. Cooking  Use low-heat cooking methods, such as baking, instead of high-heat cooking methods like deep frying.  Cook using healthy oils, such as olive, canola, or sunflower oil.  Avoid cooking with butter, cream, or high-fat meats. Meal planning  Eat meals and snacks  regularly, preferably at the same times every day. Avoid going long periods of time without eating.  Eat foods high in fiber, such as fresh fruits, vegetables, beans, and whole grains. Talk to your dietitian about how many servings of carbs  you can eat at each meal.  Eat 4-6 ounces (oz) of lean protein each day, such as lean meat, chicken, fish, eggs, or tofu. One oz of lean protein is equal to: ? 1 oz of meat, chicken, or fish. ? 1 egg. ?  cup of tofu.  Eat some foods each day that contain healthy fats, such as avocado, nuts, seeds, and fish. Lifestyle  Check your blood glucose regularly.  Exercise regularly as told by your health care provider. This may include: ? 150 minutes of moderate-intensity or vigorous-intensity exercise each week. This could be brisk walking, biking, or water aerobics. ? Stretching and doing strength exercises, such as yoga or weightlifting, at least 2 times a week.  Take medicines as told by your health care provider.  Do not use any products that contain nicotine or tobacco, such as cigarettes and e-cigarettes. If you need help quitting, ask your health care provider.  Work with a Social worker or diabetes educator to identify strategies to manage stress and any emotional and social challenges. Questions to ask a health care provider  Do I need to meet with a diabetes educator?  Do I need to meet with a dietitian?  What number can I call if I have questions?  When are the best times to check my blood glucose? Where to find more information:  American Diabetes Association: diabetes.org  Academy of Nutrition and Dietetics: www.eatright.[employer] of Diabetes and Digestive and Kidney Diseases (NIH): [employer] Summary  A healthy meal plan will help you control your blood glucose and maintain a healthy lifestyle.  Working with a diet and nutrition specialist (dietitian) can help you make a meal plan that is best for you.  Keep in  mind that carbohydrates (carbs) and alcohol have immediate effects on your blood glucose levels. It is important to count carbs and to use alcohol carefully. This information is not intended to replace advice given to you by your health care provider. Make sure you discuss any questions you have with your health care provider. Document Revised: 07/06/2017 Document Reviewed: 08/28/2016 Elsevier Patient Education  2020 [place] Eating Plan DASH stands for "Dietary Approaches to Stop Hypertension." The DASH eating plan is a healthy eating plan that has been shown to reduce high blood pressure (hypertension). It may also reduce your risk for type 2 diabetes, heart disease, and stroke. The DASH eating plan may also help with weight loss. What are tips for following this plan?  General guidelines  Avoid eating more than 2,300 mg (milligrams) of salt (sodium) a day. If you have hypertension, you may need to reduce your sodium intake to 1,500 mg a day.  Limit alcohol intake to no more than 1 drink a day for nonpregnant women and 2 drinks a day for men. One drink equals 12 oz of beer, 5 oz of wine, or 1 oz of hard liquor.  Work with your health care provider to maintain a healthy body weight or to lose weight. Ask what an ideal weight is for you.  Get at least 30 minutes of exercise that causes your heart to beat faster (aerobic exercise) most days of the week. Activities may include walking, swimming, or biking.  Work with your health care provider or diet and nutrition specialist (dietitian) to adjust your eating plan to your individual calorie needs. Reading food labels   Check food labels for the amount of sodium per serving. Choose foods with less than 5 percent of the Daily  Value of sodium. Generally, foods with less than 300 mg of sodium per serving fit into this eating plan.  To find whole grains, look for the word "whole" as the first word in the ingredient  list. Shopping  Buy products labeled as "low-sodium" or "no salt added."  Buy fresh foods. Avoid canned foods and premade or frozen meals. Cooking  Avoid adding salt when cooking. Use salt-free seasonings or herbs instead of table salt or sea salt. Check with your health care provider or pharmacist before using salt substitutes.  Do not fry foods. Cook foods using healthy methods such as baking, boiling, grilling, and broiling instead.  Cook with heart-healthy oils, such as olive, canola, soybean, or sunflower oil. Meal planning  Eat a balanced diet that includes: ? 5 or more servings of fruits and vegetables each day. At each meal, try to fill half of your plate with fruits and vegetables. ? Up to 6-8 servings of whole grains each day. ? Less than 6 oz of lean meat, poultry, or fish each day. A 3-oz serving of meat is about the same size as a deck of cards. One egg equals 1 oz. ? 2 servings of low-fat dairy each day. ? A serving of nuts, seeds, or beans 5 times each week. ? Heart-healthy fats. Healthy fats called Omega-3 fatty acids are found in foods such as flaxseeds and coldwater fish, like sardines, salmon, and mackerel.  Limit how much you eat of the following: ? Canned or prepackaged foods. ? Food that is high in trans fat, such as fried foods. ? Food that is high in saturated fat, such as fatty meat. ? Sweets, desserts, sugary drinks, and other foods with added sugar. ? Full-fat dairy products.  Do not salt foods before eating.  Try to eat at least 2 vegetarian meals each week.  Eat more home-cooked food and less restaurant, buffet, and fast food.  When eating at a restaurant, ask that your food be prepared with less salt or no salt, if possible. What foods are recommended? The items listed may not be a complete list. Talk with your dietitian about what dietary choices are best for you. Grains Whole-grain or whole-wheat bread. Whole-grain or whole-wheat pasta. Brown  rice. [place]. Bulgur. Whole-grain and low-sodium cereals. Pita bread. Low-fat, low-sodium crackers. Whole-wheat flour tortillas. Vegetables Fresh or frozen vegetables (raw, steamed, roasted, or grilled). Low-sodium or reduced-sodium tomato and vegetable juice. Low-sodium or reduced-sodium tomato sauce and tomato paste. Low-sodium or reduced-sodium canned vegetables. Fruits All fresh, dried, or frozen fruit. Canned fruit in natural juice (without added sugar). Meat and other protein foods Skinless chicken or [place]. Ground chicken or [place]. Pork with fat trimmed off. Fish and seafood. Egg whites. Dried beans, peas, or lentils. Unsalted nuts, nut butters, and seeds. Unsalted canned beans. Lean cuts of beef with fat trimmed off. Low-sodium, lean deli meat. Dairy Low-fat (1%) or fat-free (skim) milk. Fat-free, low-fat, or reduced-fat cheeses. Nonfat, low-sodium ricotta or cottage cheese. Low-fat or nonfat yogurt. Low-fat, low-sodium cheese. Fats and oils Soft margarine without trans fats. Vegetable oil. Low-fat, reduced-fat, or light mayonnaise and salad dressings (reduced-sodium). Canola, safflower, olive, soybean, and sunflower oils. Avocado. Seasoning and other foods Herbs. Spices. Seasoning mixes without salt. Unsalted popcorn and pretzels. Fat-free sweets. What foods are not recommended? The items listed may not be a complete list. Talk with your dietitian about what dietary choices are best for you. Grains Baked goods made with fat, such as croissants, muffins, or some breads.  Dry pasta or rice meal packs. Vegetables Creamed or fried vegetables. Vegetables in a cheese sauce. Regular canned vegetables (not low-sodium or reduced-sodium). Regular canned tomato sauce and paste (not low-sodium or reduced-sodium). Regular tomato and vegetable juice (not low-sodium or reduced-sodium). Angie Fava. Olives. Fruits Canned fruit in a light or heavy syrup. Fried fruit. Fruit in cream or butter  sauce. Meat and other protein foods Fatty cuts of meat. Ribs. Fried meat. Berniece Salines. Sausage. Bologna and other processed lunch meats. Salami. Fatback. Hotdogs. Bratwurst. Salted nuts and seeds. Canned beans with added salt. Canned or smoked fish. Whole eggs or egg yolks. Chicken or [place] with skin. Dairy Whole or 2% milk, cream, and half-and-half. Whole or full-fat cream cheese. Whole-fat or sweetened yogurt. Full-fat cheese. Nondairy creamers. Whipped toppings. Processed cheese and cheese spreads. Fats and oils Butter. Stick margarine. Lard. Shortening. Ghee. Bacon fat. Tropical oils, such as coconut, palm kernel, or palm oil. Seasoning and other foods Salted popcorn and pretzels. Onion salt, garlic salt, seasoned salt, table salt, and sea salt. Worcestershire sauce. Tartar sauce. Barbecue sauce. Teriyaki sauce. Soy sauce, including reduced-sodium. Steak sauce. Canned and packaged gravies. Fish sauce. Oyster sauce. Cocktail sauce. Horseradish that you find on the shelf. Ketchup. Mustard. Meat flavorings and tenderizers. Bouillon cubes. Hot sauce and Tabasco sauce. Premade or packaged marinades. Premade or packaged taco seasonings. Relishes. Regular salad dressings. Where to find more information:  National Heart, Lung, and [place]: https://wilson-eaton.com/  American Heart Association: www.heart.org Summary  The DASH eating plan is a healthy eating plan that has been shown to reduce high blood pressure (hypertension). It may also reduce your risk for type 2 diabetes, heart disease, and stroke.  With the DASH eating plan, you should limit salt (sodium) intake to 2,300 mg a day. If you have hypertension, you may need to reduce your sodium intake to 1,500 mg a day.  When on the DASH eating plan, aim to eat more fresh fruits and vegetables, whole grains, lean proteins, low-fat dairy, and heart-healthy fats.  Work with your health care provider or diet and nutrition specialist (dietitian) to adjust  your eating plan to your individual calorie needs. This information is not intended to replace advice given to you by your health care provider. Make sure you discuss any questions you have with your health care provider. Document Revised: 07/06/2017 Document Reviewed: 07/17/2016 Elsevier Patient Education  2020 [employer].

## 2019-10-07 NOTE — Chronic Care Management (AMB) (Signed)
Chronic Care Management   Initial Visit Note  10/07/2019 Name: Evan [name] MRN: [id number] DOB: [birth year]  Referred by: [name], NP Reason for referral : Chronic Care Management (RNCM Chronic Disease Management and Care Coordination Needs)   Evan [name] is a 64 y.o. year old male who is a primary care patient of [name], [name], NP. The CCM team was consulted for assistance with chronic disease management and care coordination needs related to Atrial Fibrillation, CHF, CAD, HTN, HLD and DMII  Review of patient status, including review of consultants reports, relevant laboratory and other test results, and collaboration with appropriate care team members and the patient's provider was performed as part of comprehensive patient evaluation and provision of chronic care management services.    SDOH (Social Determinants of Health) assessments performed: Yes See Care Plan activities for detailed interventions related to SDOH)  SDOH Interventions     Most Recent Value  SDOH Interventions  SDOH Interventions for the Following Domains  Alcohol Usage, Physical Activity  Physical Activity Interventions  Other (Comments) [Encouraged the patient to have a structured exercise program]  Alcohol Brief Interventions/Follow-up  AUDIT Score <7 follow-up not indicated [patient states that he has never drank alcohol]       Medications: Outpatient Encounter Medications as of 10/07/2019  Medication Sig   acetaminophen (TYLENOL) 500 MG tablet Take 500 mg by mouth every 6 (six) hours as needed.   aspirin 81 MG EC tablet Take 81 mg by mouth daily.    budesonide-formoterol (SYMBICORT) 80-4.5 MCG/ACT inhaler Inhale 2 puffs into the lungs 2 (two) times daily.   cholecalciferol (VITAMIN D3) 25 MCG (1000 UNIT) tablet Take 1 tablet (1,000 Units total) by mouth daily.   Emollient (EUCERIN) lotion Apply topically as needed for dry skin. Apply to legs and arms daily to help with dry skin.    empagliflozin (JARDIANCE) 10 MG TABS tablet Take 10 mg by mouth daily.   fluticasone (FLONASE) 50 MCG/ACT nasal spray Place 2 sprays into both nostrils daily.    furosemide (LASIX) 40 MG tablet Take 1 tablet (40 mg) by mouth twice a day   ipratropium-albuterol (DUONEB) 0.5-2.5 (3) MG/3ML SOLN Take 3 mLs by nebulization every 4 (four) hours as needed.   lisinopril (ZESTRIL) 5 MG tablet Take 2 tablets (10 mg total) by mouth daily.   metoprolol succinate (TOPROL XL) 50 MG 24 hr tablet Take 1 tablet (50 mg total) by mouth daily.   nitroGLYCERIN (NITROSTAT) 0.4 MG SL tablet Place 1 tablet (0.4 mg total) under the tongue every 5 (five) minutes as needed for chest pain.   omeprazole (PRILOSEC) 20 MG capsule Take 1 capsule (20 mg total) by mouth daily.   PROAIR HFA 108 (90 BASE) MCG/ACT inhaler Inhale 1 puff into the lungs every 4 (four) hours as needed.    risperiDONE (RISPERDAL) 0.5 MG tablet Take 1 tablet (0.5 mg total) by mouth 2 (two) times daily.   rivaroxaban (XARELTO) 20 MG TABS tablet Take 1 tablet (20 mg total) by mouth at bedtime.   rosuvastatin (CRESTOR) 20 MG tablet Take 2 tablets (40 mg total) by mouth at bedtime.   No facility-administered encounter medications on file as of 10/07/2019.     Objective:  BP Readings from Last 3 Encounters:  10/01/19 (!) 176/89  09/16/19 (!) 148/80  09/09/19 136/82   Lab Results  Component Value Date   HGBA1C 6.6 09/09/2019   Lab Results  Component Value Date   CHOL 246 (H) 09/09/2019  HDL 59 09/09/2019   LDLCALC 151 (H) 09/09/2019   LDLDIRECT 178.5 07/26/2011   TRIG 200 (H) 09/09/2019   CHOLHDL 4 09/12/2013    Goals Addressed            This Visit's Progress    RNCM: I have a lot of health problems but I manage okay       CARE PLAN ENTRY (see longtitudinal plan of care for additional care plan information)  Current Barriers:   Chronic Disease Management support, education, and care coordination needs related to Atrial  Fibrillation, CHF, CAD, HTN, HLD, and DMII  Clinical Goal(s) related to Atrial Fibrillation, CHF, CAD, HTN, HLD, and DMII:  Over the next 120 days, patient will:   Work with the care management team to address educational, disease management, and care coordination needs   Begin or continue self health monitoring activities as directed today  Measure and record blood pressure 3 times per week, and Measure and record weight daily, DMII under control at this time as evidence by Hemoglobin A1C at 6.6 and last fasting blood sugar on labs of 113  Call provider office for new or worsened signs and symptoms related to Hypoglycemia or Hyperglycemia, Blood pressure findings outside established parameters, Weight outside established parameters, and New or worsened symptom related to Chronic Health Conditions  Call care management team with questions or concerns  Verbalize basic understanding of patient centered plan of care established today  Interventions related to Atrial Fibrillation, CHF, CAD, HTN, HLD, and DMII:   Evaluation of current treatment plans and patient's adherence to plan as established by provider  Assessed patient understanding of disease states  Assessed patient's education and care coordination needs- discussed taking blood pressures (patient does not have a cuff- discussed the OTC product book through [facility] and the monitoring program through [facility]).    Assessed the patient's need for blood glucose testing on a daily basis.  The patient's most recent hemoglobin A1C is 6.6 and fasting blood sugar was 113.  Will continue to monitor for changes  Education to the patient about calling [facility] Customer service line to ask about Hardin Negus life alert system or comparable system since he lives alone    Provided disease specific education to patient- education on the benefits of daily monitoring of VS and daily weight checks to maintain health and well being  Education on DASH diet and ADA diet-  written information provided through the myChart function and EMMI   Collaborated with appropriate clinical care team members regarding patient needs- patient is already involved with LCSW and Pharmacist, will continue to monitor for changes in condition and support.  Patient Self Care Activities related to Atrial Fibrillation, CHF, CAD, HTN, HLD, and DMII:   Patient is unable to independently self-manage chronic health conditions  Initial goal documentation         Mr. Aime was given information about Chronic Care Management services today including:  1. CCM service includes personalized support from designated clinical staff supervised by his physician, including individualized plan of care and coordination with other care providers 2. 24/7 contact phone numbers for assistance for urgent and routine care needs. 3. Service will only be billed when office clinical staff spend 20 minutes or more in a month to coordinate care. 4. Only one practitioner may furnish and bill the service in a calendar month. 5. The patient may stop CCM services at any time (effective at the end of the month) by phone call to the office staff.  6. The patient will be responsible for cost sharing (co-pay) of up to 20% of the service fee (after annual deductible is met).  Patient agreed to services and verbal consent obtained.   Plan:   The care management team will reach out to the patient again over the next 60 days.   Noreene Larsson RN, MSN, [facility] Mobile: [phone]

## 2019-10-10 ENCOUNTER — Other Ambulatory Visit: Payer: Self-pay

## 2019-10-10 MED ORDER — NITROGLYCERIN 0.4 MG SL SUBL: 0.4000 mg | SUBLINGUAL_TABLET | SUBLINGUAL | 1 refills | Status: DC | PRN

## 2019-10-10 MED ORDER — FUROSEMIDE 40 MG PO TABS: 40.0000 mg | ORAL_TABLET | Freq: Two times a day (BID) | ORAL | 3 refills | Status: DC

## 2019-10-10 MED ORDER — JARDIANCE 10 MG PO TABS: 10.0000 mg | ORAL_TABLET | Freq: Every day | ORAL | 3 refills | Status: DC

## 2019-10-10 MED ORDER — OMEPRAZOLE 20 MG PO CPDR: 20.0000 mg | DELAYED_RELEASE_CAPSULE | Freq: Every day | ORAL | 3 refills | Status: DC

## 2019-10-10 MED ORDER — RIVAROXABAN 20 MG PO TABS: 20.0000 mg | ORAL_TABLET | Freq: Every day | ORAL | 3 refills | Status: DC

## 2019-10-10 MED ORDER — ROSUVASTATIN CALCIUM 20 MG PO TABS: 40.0000 mg | ORAL_TABLET | Freq: Every day | ORAL | 3 refills | Status: DC

## 2019-10-10 MED ORDER — METOPROLOL SUCCINATE ER 50 MG PO TB24: 50.0000 mg | ORAL_TABLET | Freq: Every day | ORAL | 3 refills | Status: DC

## 2019-10-10 MED ORDER — LISINOPRIL 5 MG PO TABS: 10.0000 mg | ORAL_TABLET | Freq: Every day | ORAL | 3 refills | Status: DC

## 2019-10-10 MED ORDER — RISPERIDONE 0.5 MG PO TABS: 0.5000 mg | ORAL_TABLET | Freq: Two times a day (BID) | ORAL | 3 refills | Status: DC

## 2019-10-10 NOTE — Telephone Encounter (Addendum)
New Rx request for [facility] for Rosuvastatin, Lisinopril., Nitroglycerin, Risperidone, Jardiance, Furosemide, Xarelto, Metoprolol, Omeprazole.  LOV: 09/09/19

## 2019-10-13 ENCOUNTER — Telehealth: Payer: Self-pay | Admitting: Nurse Practitioner

## 2019-10-13 ENCOUNTER — Emergency Department
Admission: EM | Admit: 2019-10-13 | Discharge: 2019-10-13 | Discharge status: Against Medical Advice | Payer: Medicare Other | Attending: Emergency Medicine | Admitting: Emergency Medicine

## 2019-10-13 ENCOUNTER — Encounter: Payer: Self-pay | Admitting: *Deleted

## 2019-10-13 ENCOUNTER — Other Ambulatory Visit: Payer: Self-pay

## 2019-10-13 DIAGNOSIS — R55 Syncope and collapse: Secondary | ICD-10-CM | POA: Diagnosis not present

## 2019-10-13 DIAGNOSIS — Z743 Need for continuous supervision: Secondary | ICD-10-CM | POA: Diagnosis not present

## 2019-10-13 DIAGNOSIS — Z5321 Procedure and treatment not carried out due to patient leaving prior to being seen by health care provider: Secondary | ICD-10-CM | POA: Insufficient documentation

## 2019-10-13 LAB — CBC
HCT: 42.2 % (ref 39.0–52.0)
Hemoglobin: 13.2 g/dL (ref 13.0–17.0)
MCH: 27.4 pg (ref 26.0–34.0)
MCHC: 31.3 g/dL (ref 30.0–36.0)
MCV: 87.7 fL (ref 80.0–100.0)
Platelets: 276 10*3/uL (ref 150–400)
RBC: 4.81 MIL/uL (ref 4.22–5.81)
RDW: 15.5 % (ref 11.5–15.5)
WBC: 7 10*3/uL (ref 4.0–10.5)
nRBC: 0 % (ref 0.0–0.2)

## 2019-10-13 LAB — BASIC METABOLIC PANEL
Anion gap: 9 (ref 5–15)
BUN: 18 mg/dL (ref 8–23)
CO2: 24 mmol/L (ref 22–32)
Calcium: 9 mg/dL (ref 8.9–10.3)
Chloride: 105 mmol/L (ref 98–111)
Creatinine, Ser: 1.21 mg/dL (ref 0.61–1.24)
GFR calc Af Amer: >60 mL/min (ref >60)
GFR calc non Af Amer: >60 mL/min (ref >60)
Glucose, Bld: 130 mg/dL — ABNORMAL HIGH (ref 70–99)
Potassium: 4 mmol/L (ref 3.5–5.1)
Sodium: 138 mmol/L (ref 135–145)

## 2019-10-13 LAB — TROPONIN I (HIGH SENSITIVITY): Troponin I (High Sensitivity): 14 ng/L (ref <18)

## 2019-10-13 MED ORDER — SODIUM CHLORIDE 0.9% FLUSH: 3.0000 mL | Freq: Once | INTRAVENOUS | Status: DC

## 2019-10-13 NOTE — ED Notes (Signed)
No answer when called several times from lobby 

## 2019-10-13 NOTE — ED Notes (Signed)
First Nurse Note:  Pt in via [facility] from home; EMS called per bystander due to pt sitting in apartment complex parking lot.  EMS reports possible fall, also reports communication not making any sense.  Pt alert upon arrival, using cell phone, NAD noted at this time.

## 2019-10-13 NOTE — ED Triage Notes (Signed)
Pt brought in via ems from apartment complex.  Pt reportedly had a syncope episode.  No chest pain or sob.  Pt reports acid reflux.  No neck or back pain.  Pt alert  Speech clear.  Pt in a wheelchair.

## 2019-10-13 NOTE — Telephone Encounter (Signed)
Attempted to call, no answer.  Appears to be in ER.

## 2019-10-13 NOTE — Telephone Encounter (Signed)
Copied from [place] [phone]. Topic: General - Other >> Oct 13, 2019  4:05 PM Celene Kras wrote: Reason for CRM: Pt called stating that he had a spell where he passed out. The pt wound up in the hospital. The pt states that he has passed out and that he is going to sue housing. Pt states that he got so mad. He states he got upset that he passed out. Pt is requesting to have PCP call him to go over this with him. Please advise. >> Oct 13, 2019  4:26 PM Georgina Peer, CMA wrote: Routing to provider.

## 2019-10-14 ENCOUNTER — Ambulatory Visit: Payer: Self-pay | Admitting: Licensed Clinical Social Worker

## 2019-10-14 DIAGNOSIS — I10 Essential (primary) hypertension: Secondary | ICD-10-CM

## 2019-10-14 DIAGNOSIS — I152 Hypertension secondary to endocrine disorders: Secondary | ICD-10-CM

## 2019-10-14 DIAGNOSIS — I5032 Chronic diastolic (congestive) heart failure: Secondary | ICD-10-CM

## 2019-10-14 DIAGNOSIS — I25118 Atherosclerotic heart disease of native coronary artery with other forms of angina pectoris: Secondary | ICD-10-CM

## 2019-10-14 DIAGNOSIS — E1129 Type 2 diabetes mellitus with other diabetic kidney complication: Secondary | ICD-10-CM

## 2019-10-14 DIAGNOSIS — E1159 Type 2 diabetes mellitus with other circulatory complications: Secondary | ICD-10-CM

## 2019-10-14 DIAGNOSIS — I4819 Other persistent atrial fibrillation: Secondary | ICD-10-CM

## 2019-10-14 DIAGNOSIS — E785 Hyperlipidemia, unspecified: Secondary | ICD-10-CM

## 2019-10-14 DIAGNOSIS — R809 Proteinuria, unspecified: Secondary | ICD-10-CM

## 2019-10-14 DIAGNOSIS — E1169 Type 2 diabetes mellitus with other specified complication: Secondary | ICD-10-CM

## 2019-10-14 NOTE — Telephone Encounter (Signed)
Thank you :)

## 2019-10-14 NOTE — Chronic Care Management (AMB) (Signed)
Care Management   Follow Up Note   10/14/2019 Name: Evan [name] MRN: [id number] DOB: [birth year]  Referred by: [name], NP Reason for referral : Evan [name] is a 64 y.o. year old male who is a primary care patient of [name], [name], NP. The care management team was consulted for assistance with care management and care coordination needs.    Review of patient status, including review of consultants reports, relevant laboratory and other test results, and collaboration with appropriate care team members and the patient's provider was performed as part of comprehensive patient evaluation and provision of chronic care management services.    SDOH (Social Determinants of Health) assessments performed: Yes See Care Plan activities for detailed interventions related to [facility])     Advanced Directives: See Care Plan and Vynca application for related entries.   Goals Addressed    . "I left [facility] and need more support now." (pt-stated)       Current Barriers:  . Financial constraints related to managing health care . Limited social support . ADL IADL limitations . Social Isolation . Limited access to caregiver . Inability to perform ADL's independently . Inability to perform IADL's independently  Clinical Social Work Clinical Goal(s):  Marland Kitchen Over the next 120 days, patient will work with SW to address concerns related to lack of support within the home . Over the next 120 days, patient will demonstrate improved health management independence as evidenced by implementing appropriate self-care into daily routine    Interventions:  . Patient interviewed and appropriate assessments performed-new referral . Patient reports receiving a call from a Medicare nurse last night at 8 pm. This is concerning to him as he does not prefer phone calls at night and informed the nurse of this preference.  . Provided patient with information about level of care options,  personal care service resources and Medicaid benefits.  . Discussed plans with patient for ongoing care management follow up and provided patient with direct contact information for care management team . Collaborated with Medicaid (community agency) re: need for Medicaid information (number and caseworker) LCSW left detailed voice message with Evan [name] who manages all active and ongoing Medicaid cases.  . Assisted patient/caregiver with obtaining information about health plan benefits . A voluntary and extensive discussion about advanced care planning including explanation and discussion of advanced was undertaken with the patient.  Explanation regarding healthcare proxy and living will was reviewed and packet with forms with explanation of how to fill them out was given.   . Provided education to patient/caregiver regarding level of care options. . Provided education to patient/caregiver about Hospice and/or Palliative Care services . Patient is new to [facility] and is in need of community resource connection and is unable to provide Medicaid number and reports that DSS informed him that they are in the process of transitioning his Medicaid from [facility] to [employer]. Patient is agreeable to [facility] referral once Medicaid information has been received. LCSW will await for return call DSS Medicaid caseworker.  . Emotional support provided due to ongoing stress/anxiety/anger outburst. Solution focused therapy implemented into entire session today. Marland Kitchen UPDATE- Patient reports that he received a letter in the mail from his landlord yesterday asking for him to relocate as he has too many belongings in his residence that the landlord has considered hoarding. Patient reports that if his belongings are not moved out by 10/21/19 then he will have to relocate. Patient reports that his  landlord has been very disrespectful to him and even laughed at him. Patient reports that the stress caused him to pass out and fall on the  pavement which led to a ED visit. LCSW completed deep breathing exercises with patient to alleviate anxiety and stress. Patient was receptive was support provided.   Patient Self Care Activities:  . Attends all scheduled provider appointments . Lacks social connections  Please see past updates related to this goal by clicking on the "Past Updates" button in the selected goal      The care management team will reach out to the patient again over the next 60 days.   [name], BSW, MSW, [facility] Care Management [email or web address] Phone: [phone]

## 2019-10-14 NOTE — Telephone Encounter (Signed)
Called and spoke with patient. He states that he got a letter yesterday from his landlord stating that he has too much stuff and needs to get it out by the 16th or he would be kicked out. Patient states that the landlord also laughed at him and states that it really got to him. States it was what made him pass out. Went to the ER but sat there for 3 hours and did not get seen per patient. States he just left. Asked patient how he was feeling today and he states that he is feeling better today. States he is happy now.

## 2019-10-15 ENCOUNTER — Telehealth: Payer: Self-pay

## 2019-10-22 ENCOUNTER — Telehealth: Payer: Self-pay | Admitting: Nurse Practitioner

## 2019-10-22 NOTE — Telephone Encounter (Signed)
CRM: [phone] pt is requesting a call back for assistance. Please advise, need home help.

## 2019-10-22 NOTE — Telephone Encounter (Signed)
Pt was wanting to know date of conference call with [name] and medicaid caseworker provided information Jill Alexanders

## 2019-10-22 NOTE — Telephone Encounter (Signed)
Also received voicemail from patient that he was returning a call. I did not call him. Did anyone try to call this patient today?

## 2019-10-22 NOTE — Telephone Encounter (Signed)
I had not contacted this patient.

## 2019-10-22 NOTE — Telephone Encounter (Signed)
Hi yes, I called him and left  My # I will try again thanks!

## 2019-10-27 ENCOUNTER — Emergency Department: Payer: Medicare Other

## 2019-10-27 ENCOUNTER — Other Ambulatory Visit: Payer: Self-pay

## 2019-10-27 ENCOUNTER — Emergency Department
Admission: EM | Admit: 2019-10-27 | Discharge: 2019-10-27 | Disposition: A | Discharge status: Home / Self Care | Payer: Medicare Other | Attending: Student | Admitting: Student

## 2019-10-27 ENCOUNTER — Ambulatory Visit: Payer: Self-pay | Admitting: *Deleted

## 2019-10-27 ENCOUNTER — Ambulatory Visit: Payer: Self-pay | Admitting: Licensed Clinical Social Worker

## 2019-10-27 ENCOUNTER — Ambulatory Visit: Payer: Self-pay | Admitting: General Practice

## 2019-10-27 DIAGNOSIS — I5032 Chronic diastolic (congestive) heart failure: Secondary | ICD-10-CM

## 2019-10-27 DIAGNOSIS — I10 Essential (primary) hypertension: Secondary | ICD-10-CM | POA: Diagnosis not present

## 2019-10-27 DIAGNOSIS — M79661 Pain in right lower leg: Secondary | ICD-10-CM

## 2019-10-27 DIAGNOSIS — Z7901 Long term (current) use of anticoagulants: Secondary | ICD-10-CM | POA: Insufficient documentation

## 2019-10-27 DIAGNOSIS — E119 Type 2 diabetes mellitus without complications: Secondary | ICD-10-CM | POA: Diagnosis not present

## 2019-10-27 DIAGNOSIS — F423 Hoarding disorder: Secondary | ICD-10-CM

## 2019-10-27 DIAGNOSIS — I252 Old myocardial infarction: Secondary | ICD-10-CM | POA: Insufficient documentation

## 2019-10-27 DIAGNOSIS — I4819 Other persistent atrial fibrillation: Secondary | ICD-10-CM | POA: Diagnosis not present

## 2019-10-27 DIAGNOSIS — I152 Hypertension secondary to endocrine disorders: Secondary | ICD-10-CM

## 2019-10-27 DIAGNOSIS — I110 Hypertensive heart disease with heart failure: Secondary | ICD-10-CM | POA: Diagnosis not present

## 2019-10-27 DIAGNOSIS — F419 Anxiety disorder, unspecified: Secondary | ICD-10-CM

## 2019-10-27 DIAGNOSIS — Z7982 Long term (current) use of aspirin: Secondary | ICD-10-CM | POA: Insufficient documentation

## 2019-10-27 DIAGNOSIS — W19XXXA Unspecified fall, initial encounter: Secondary | ICD-10-CM | POA: Diagnosis not present

## 2019-10-27 DIAGNOSIS — E785 Hyperlipidemia, unspecified: Secondary | ICD-10-CM | POA: Diagnosis not present

## 2019-10-27 DIAGNOSIS — J449 Chronic obstructive pulmonary disease, unspecified: Secondary | ICD-10-CM | POA: Diagnosis not present

## 2019-10-27 DIAGNOSIS — E1159 Type 2 diabetes mellitus with other circulatory complications: Secondary | ICD-10-CM

## 2019-10-27 DIAGNOSIS — Z951 Presence of aortocoronary bypass graft: Secondary | ICD-10-CM | POA: Diagnosis not present

## 2019-10-27 DIAGNOSIS — R1111 Vomiting without nausea: Secondary | ICD-10-CM | POA: Diagnosis not present

## 2019-10-27 DIAGNOSIS — Z7984 Long term (current) use of oral hypoglycemic drugs: Secondary | ICD-10-CM | POA: Insufficient documentation

## 2019-10-27 DIAGNOSIS — K30 Functional dyspepsia: Secondary | ICD-10-CM | POA: Insufficient documentation

## 2019-10-27 DIAGNOSIS — Z743 Need for continuous supervision: Secondary | ICD-10-CM | POA: Diagnosis not present

## 2019-10-27 DIAGNOSIS — Z79899 Other long term (current) drug therapy: Secondary | ICD-10-CM | POA: Insufficient documentation

## 2019-10-27 DIAGNOSIS — R5381 Other malaise: Secondary | ICD-10-CM | POA: Diagnosis not present

## 2019-10-27 LAB — COMPREHENSIVE METABOLIC PANEL
ALT: 17 U/L (ref 0–44)
AST: 17 U/L (ref 15–41)
Albumin: 4.1 g/dL (ref 3.5–5.0)
Alkaline Phosphatase: 91 U/L (ref 38–126)
Anion gap: 11 (ref 5–15)
BUN: 18 mg/dL (ref 8–23)
CO2: 23 mmol/L (ref 22–32)
Calcium: 9.2 mg/dL (ref 8.9–10.3)
Chloride: 103 mmol/L (ref 98–111)
Creatinine, Ser: 0.83 mg/dL (ref 0.61–1.24)
GFR calc Af Amer: >60 mL/min (ref >60)
GFR calc non Af Amer: >60 mL/min (ref >60)
Glucose, Bld: 118 mg/dL — ABNORMAL HIGH (ref 70–99)
Potassium: 3.9 mmol/L (ref 3.5–5.1)
Sodium: 137 mmol/L (ref 135–145)
Total Bilirubin: 0.9 mg/dL (ref 0.3–1.2)
Total Protein: 7.6 g/dL (ref 6.5–8.1)

## 2019-10-27 LAB — CBC
HCT: 43.1 % (ref 39.0–52.0)
Hemoglobin: 13.5 g/dL (ref 13.0–17.0)
MCH: 27.5 pg (ref 26.0–34.0)
MCHC: 31.3 g/dL (ref 30.0–36.0)
MCV: 87.8 fL (ref 80.0–100.0)
Platelets: 281 10*3/uL (ref 150–400)
RBC: 4.91 MIL/uL (ref 4.22–5.81)
RDW: 15.5 % (ref 11.5–15.5)
WBC: 7.3 10*3/uL (ref 4.0–10.5)
nRBC: 0 % (ref 0.0–0.2)

## 2019-10-27 LAB — LIPASE, BLOOD: Lipase: 26 U/L (ref 11–51)

## 2019-10-27 LAB — TROPONIN I (HIGH SENSITIVITY): Troponin I (High Sensitivity): 13 ng/L (ref <18)

## 2019-10-27 IMAGING — US US EXTREM LOW VENOUS*R*
1 series · 13 of 24 positions shown · non-contrast
Comparison: None.

CLINICAL DATA: Initial evaluation for acute right lower extremity
pain, history of fall.



[Series 1: us venous img lower uni right (dvt) · portal-venous · 13 of 33 slices shown]
[im 1/33]
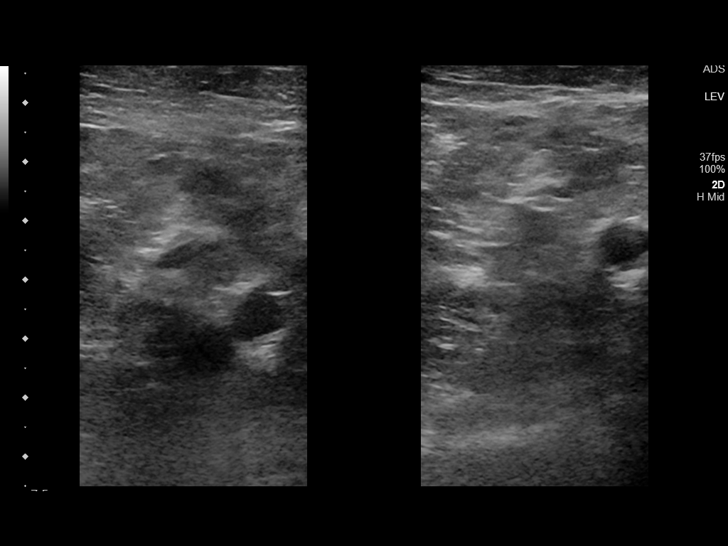
[im 3/33]
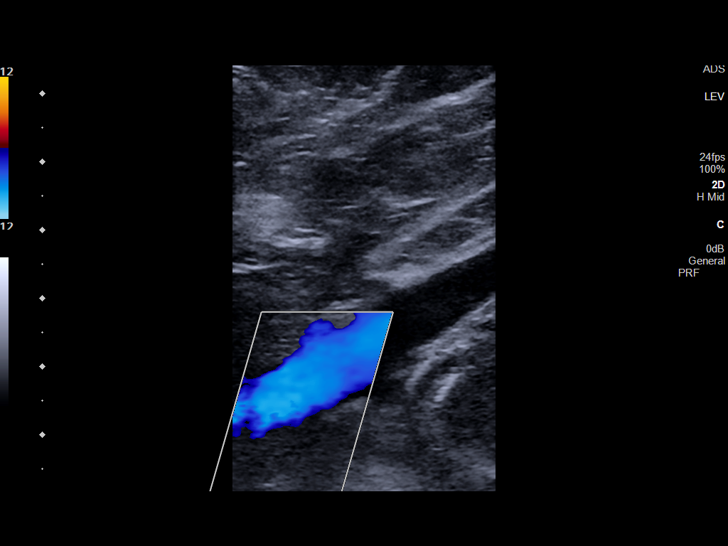
[im 6/33]
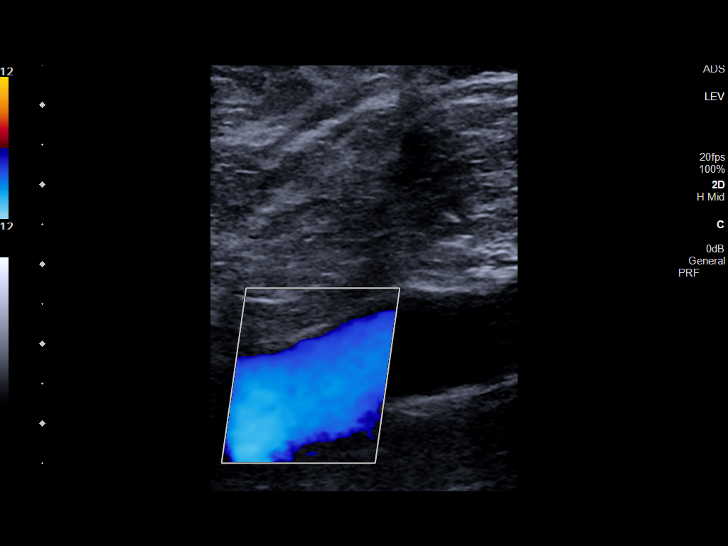
[im 9/33]
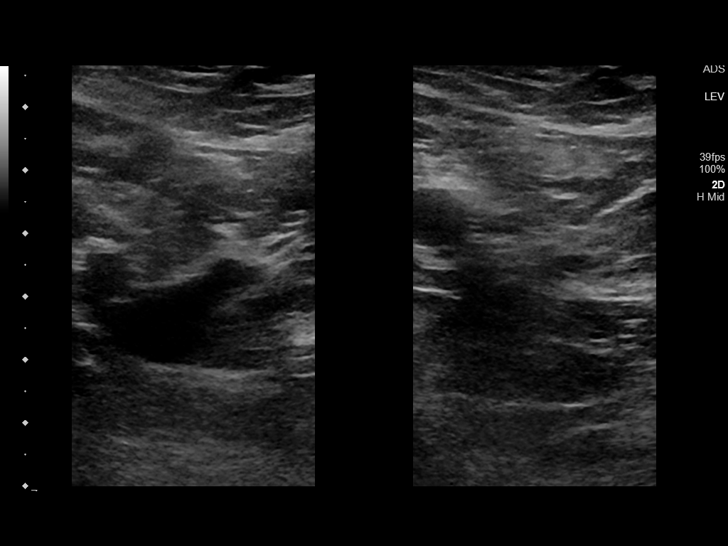
[im 12/33]
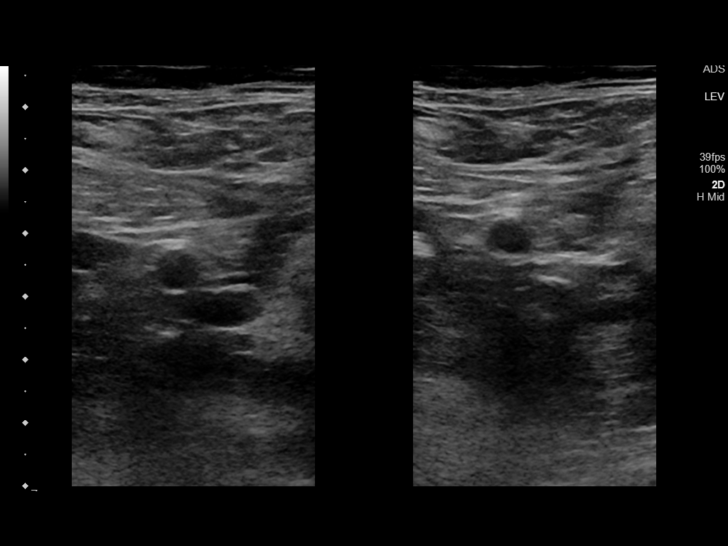
[im 14/33]
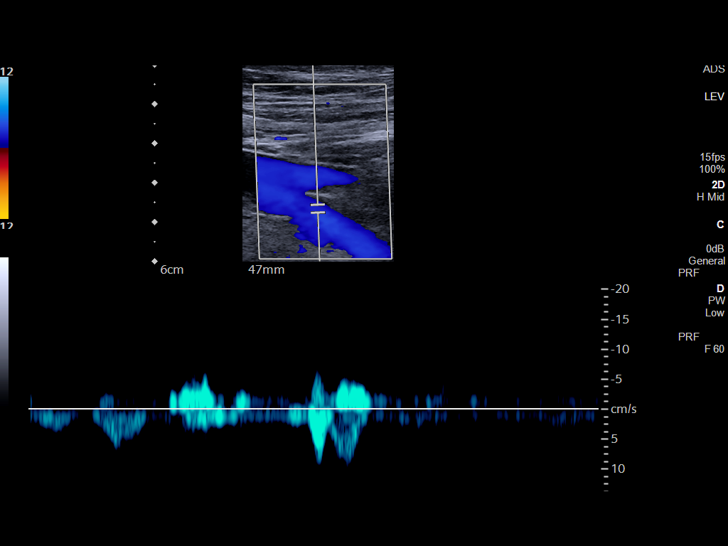
[im 17/33]
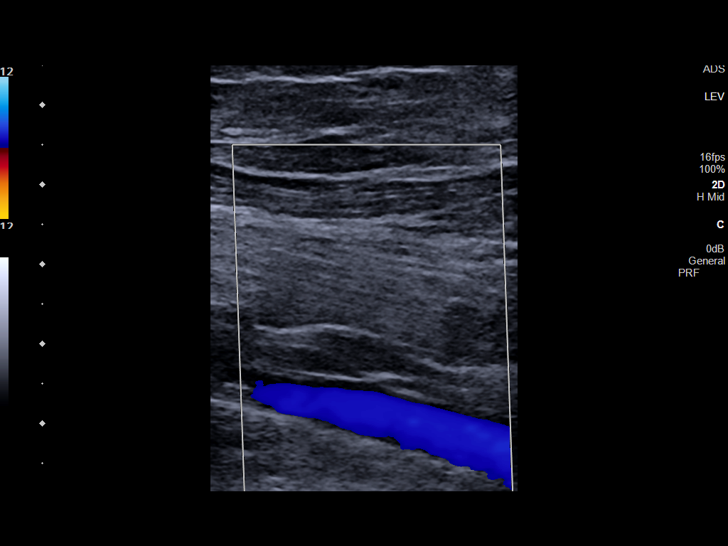
[im 19/33]
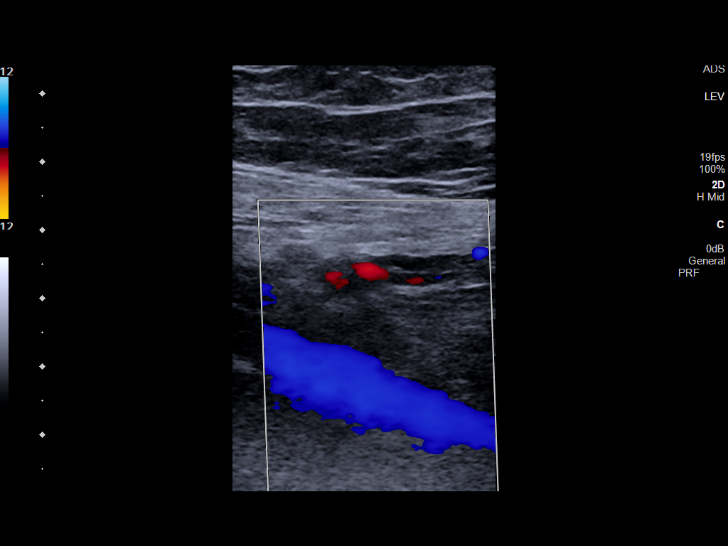
[im 21/33]
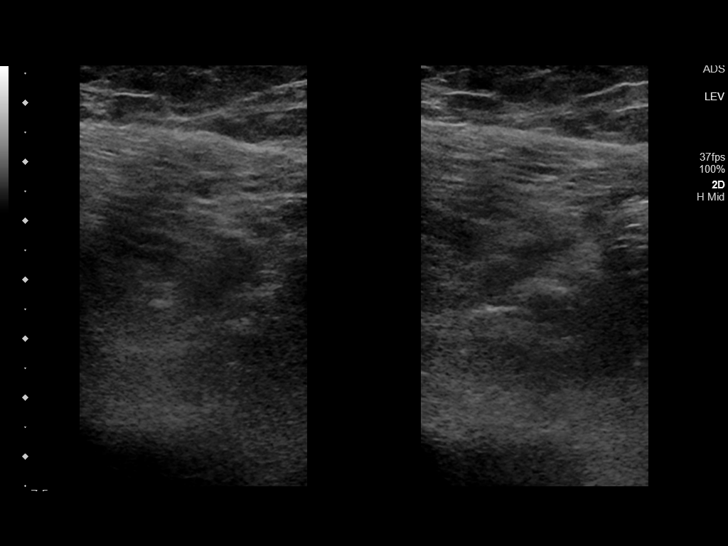
[im 24/33]
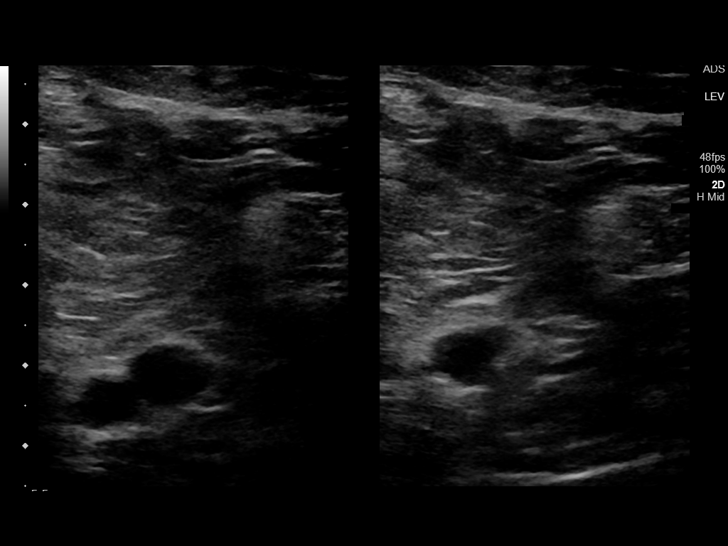
[im 27/33]
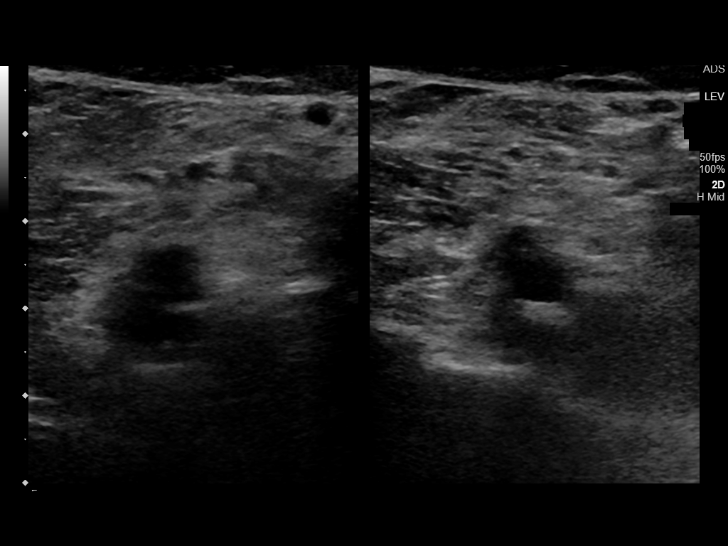
[im 30/33]
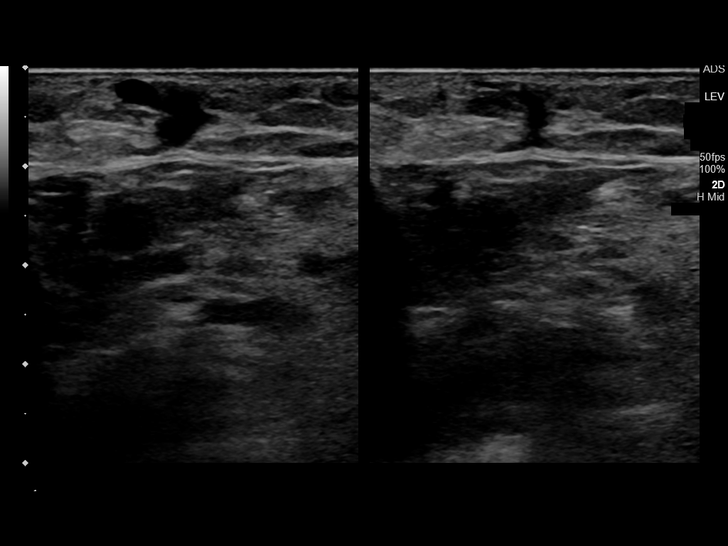
[im 33/33]
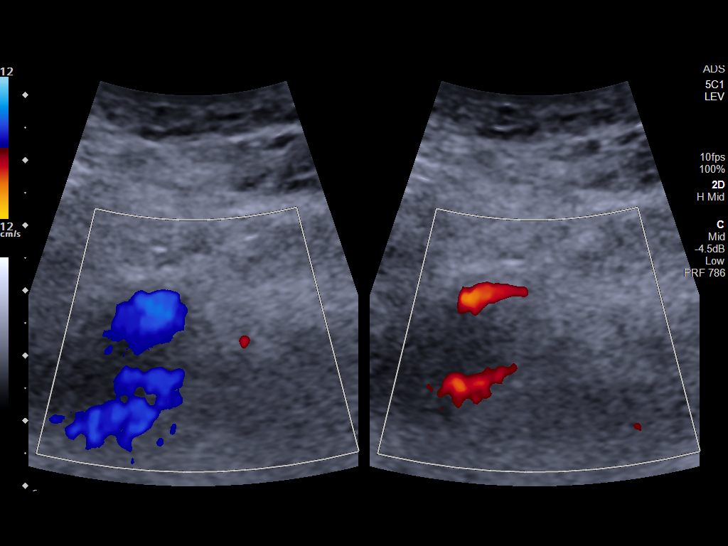

[13 of 24 positions shown; findings below may reference images not displayed]

FINDINGS: Contralateral Common Femoral Vein: Respiratory phasicity is normal
and symmetric with the symptomatic side. No evidence of thrombus.
Normal compressibility.

Common Femoral Vein: No evidence of thrombus. Normal
compressibility, respiratory phasicity and response to augmentation.

Saphenofemoral Junction: No evidence of thrombus. Normal
compressibility and flow on color Doppler imaging.

Profunda Femoral Vein: No evidence of thrombus. Normal
compressibility and flow on color Doppler imaging.

Femoral Vein: No evidence of thrombus. Normal compressibility,
respiratory phasicity and response to augmentation.

Popliteal Vein: No evidence of thrombus. Normal compressibility,
respiratory phasicity and response to augmentation.

Calf Veins: No evidence of thrombus. Normal compressibility and flow
on color Doppler imaging.

Superficial Great Saphenous Vein: No evidence of thrombus. Normal
compressibility.

Venous Reflux:  None.

Other Findings:  None.
IMPRESSION: No evidence of deep venous thrombosis.

## 2019-10-27 IMAGING — DX DG TIBIA/FIBULA 2V*R*
3 series · 3 of 3 positions shown · non-contrast
Comparison: [DATE]

CLINICAL DATA: Right lower leg pain

EXAM:
RIGHT TIBIA AND FIBULA - 2 VIEW

[tibia ap]
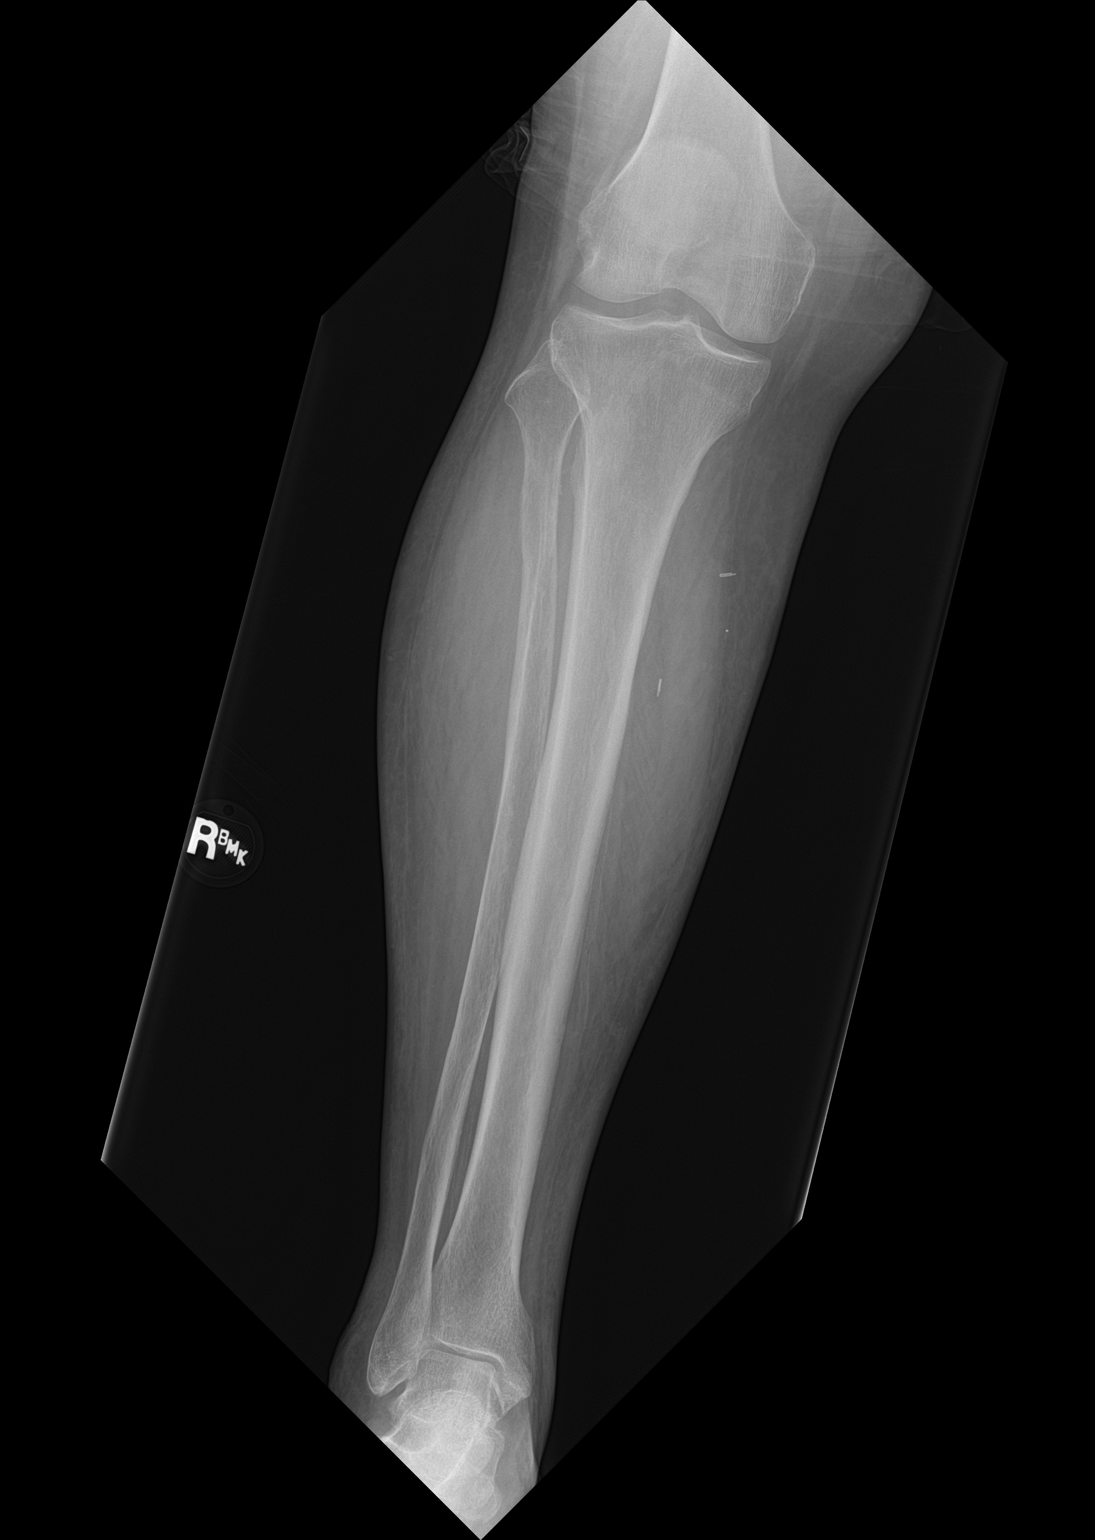

[tibia lat (1 of 2)]
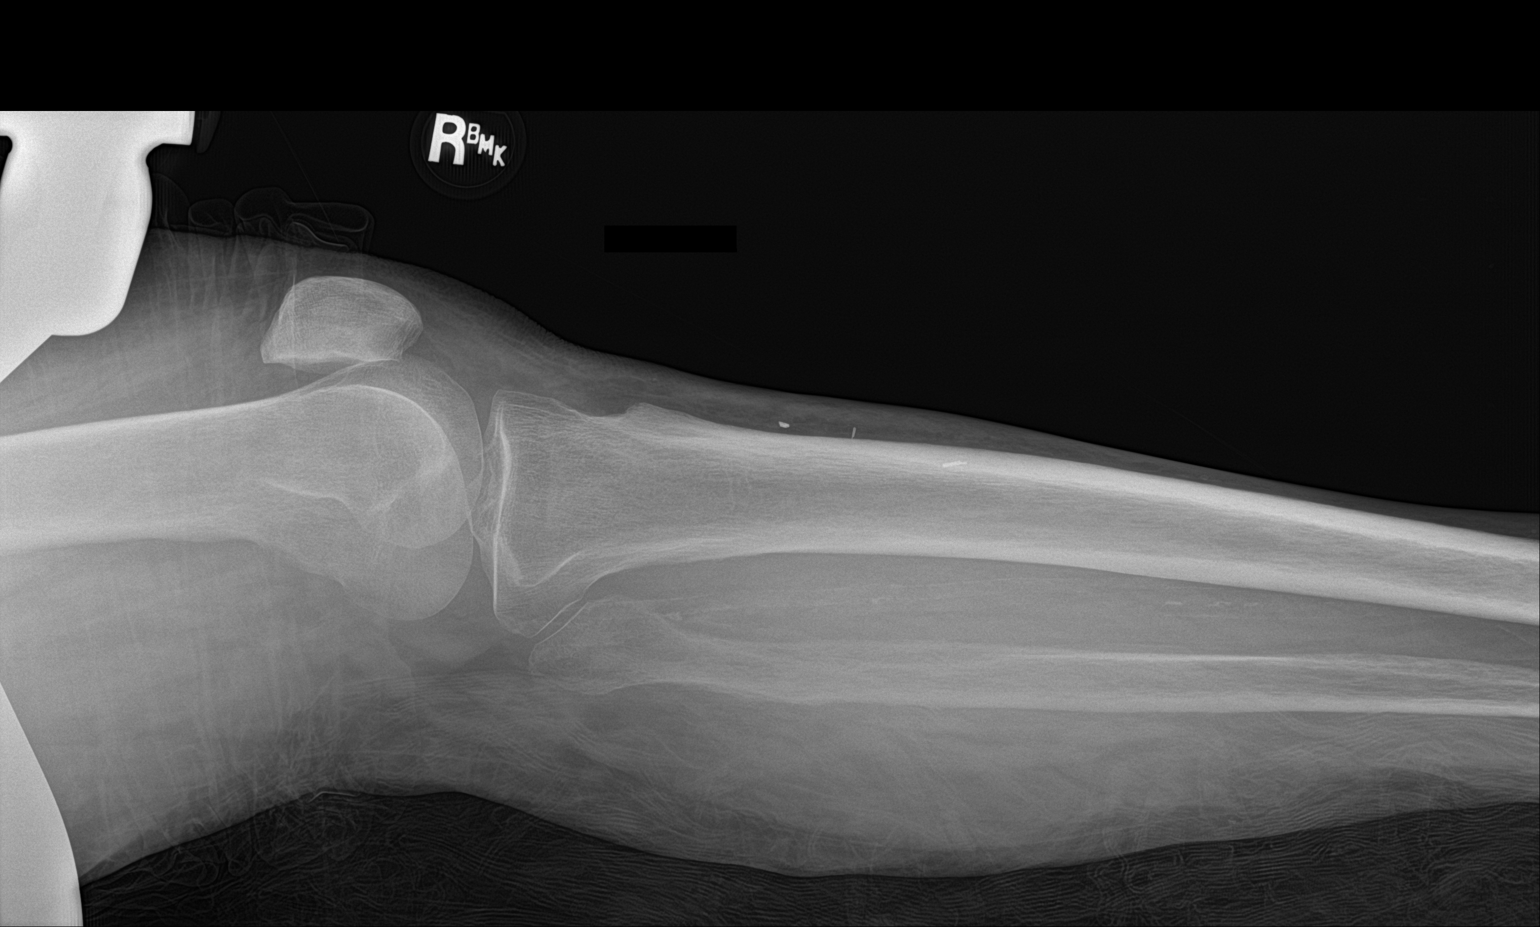

[tibia lat (2 of 2)]
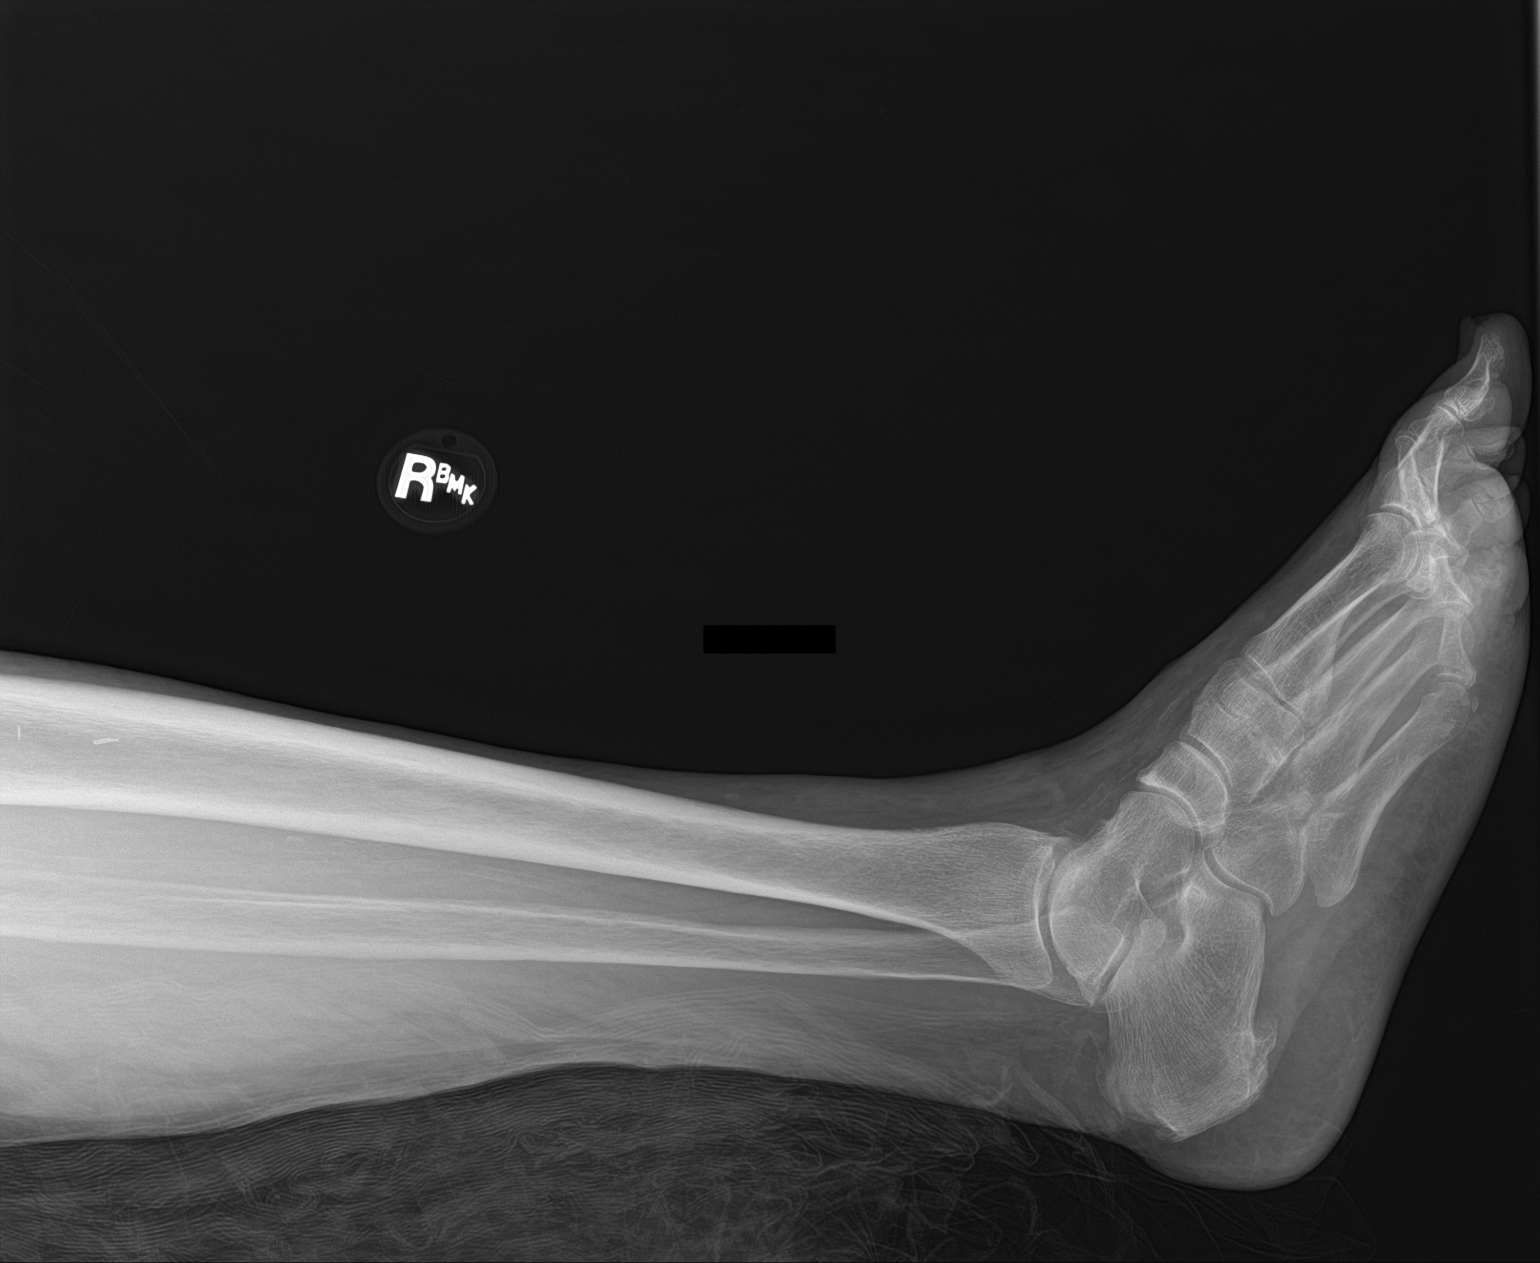

[3 of 3 positions shown; findings below may reference images not displayed]

FINDINGS: There is no evidence of fracture or other focal bone lesions. Soft
tissues are unremarkable.
IMPRESSION: Negative.

## 2019-10-27 MED ORDER — SODIUM CHLORIDE 0.9% FLUSH: 3.0000 mL | Freq: Once | INTRAVENOUS | Status: DC

## 2019-10-27 NOTE — Telephone Encounter (Signed)
I'm having vertigo and I'm vomiting now.   I'm hurting really bad.   I'm emotionally upset. (Pt called in with multiple complaints due to having to be out of his dwelling in 10 days.   He got an eviction notice.   He wants [employer] or someone to come see what they are doing and write an appeal for him).  Let me get my thoughts.    I having acid reflux and my throat is hurting.   My eyes are matted. l   I had diarrhea yesterday.     I got a notice of eviction that I have to be out in 10 days.    I can't get my stuff out of here because the thrift stores won't take it due to COVID-19. I have diabetes.   I'm shaking.   These people are unreasonable.   They don't care.    I need you guys to come up here and take care of this.    I fell.   I was laying on the ground and they would not help me up.    They keep telling me to clean this up.   I don't feel safe here.   I went to the hospital the day I fell by ambulance.   I was so exhausted.  I passed out when I fell.   I came to in the ambulance.   The police came out too.    This place is well known as having drug dealers.   It has "gone to hell in a hand basket" excuse my [place].    I have no where to go.  I [place] live [place] the woods.   They keep telling me to get rid of my stuff.   My stuff is very valuable.   I can't remember when I went to the hospital after I fell.    My social worker knows what happened.     ( In chart it's on 10/13/2019).   I did not stay in the hospital because I was throwing up.   I was waiting 3 hours.   I just could not stay there.     Yesterday I called 911 because I was throwing up and dizzy.  I wanted to have them check my BP.   The EMS guy said I understand why they want you to get rid of some of this stuff.   "I'm not a hoarder".     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   They are telling me I'm evicted.    I'm not going to the ED.   They will close me out of my house.    I can't go there and wait in the ED.   "I'm not going to  the ED".    I need her to do an appeal so I won't be evicted.    I just need an appeal so they won't kick me out.     I've already talked with [name], the social worker.  She knows what is going on.   I'm seeing a bunch of dots in my eyes.    This has been going on.    "This is evil how can they throw me out of here?"   I'm still having vertigo but I'm not throwing up now.   I'm just upset.   That's what wrong.   I had to pay someone to come bring me home from the ED.  I've already left a message for [place] right before I called you.   She's not in.   So I left a message.  I called into the office and spoke with [facility].   She asked me to send my notes over.    After talking with him for over 30 minutes I was able to get him to agree to someone calling him back.  No protocol used.  I sent these notes to [facility] high priority.

## 2019-10-27 NOTE — ED Notes (Signed)
First Nurse Note:  Pt in via [facility] from home with complaints of N/V over the weekend.  Per EMS, vitals WDL.  Pt alert, NAD noted upon arrival.

## 2019-10-27 NOTE — ED Provider Notes (Signed)
[facility] Emergency Department Provider Note  ____________________________________________   First MD Initiated Contact with Patient 10/27/19 1806     (approximate)  I have reviewed the triage vital signs and the nursing notes.  History  Chief Complaint Emesis    HPI Evan [name] is a 64 y.o. male with PMHx as below who presents with multiple complaints. His primary concern on my evaluation seems to be R lower leg pain. Patient states he sustained a fall several weeks ago and injured the lower leg.  He locates his pain to the right lower leg, 5/10 in severity, aching, no radiation, no alleviating or aggravating components.  He has been ambulating with a walker without issue. He denies any other injuries related to this fall. He states he fell because he got so upset during an argument with his landlord.   He is also concerned about his living situation.  Apparently, his landlord recently evicted him and he is working on filing paperwork to "block this".  He does state he has a safe place to go home to, though he is concerned about getting all the paperwork and "legal proceedings" done in time. He is still in his current home while sorting out the situation. He denies any housing needs or social work assistance at this time.  He also complains of chronic indigestion, which he feels in his upper esophagus area. This is ongoing and chronic and unchanged. He denies any chest pain or related shortness of breath.   Past Medical Hx Past Medical History:  Diagnosis Date  . Anginal pain ([place])   . Anxiety disorder   . Asthma   . Atresia of esophagus without fistula   . CAD (coronary artery disease)   . Cellulitis   . CHF (congestive heart failure) ([place])    NYHA CLASS III,CHRONIC,DIASTOLIC  . COPD (chronic obstructive pulmonary disease) ([place])   . Diabetes mellitus without complication ([place])   . Edema    RIGHT LOWER LEG  . Gastroesophageal reflux   . H/O:  GI bleed   . History of pneumonia    Remote  . History of scarlet fever    Childhood  . Hyperlipidemia   . Hypertension   . Myocardial infarction ([place]) 2009  . Obesity   . Obstructive sleep apnea   . Pain    CHRONIC BACK / ABDOMINAL  . Panic disorder   . Peripheral venous insufficiency   . PTSD (post-traumatic stress disorder)   . Retinopathy    DIABETIC  . Stasis, venous   . Vertigo     Problem List Patient Active Problem List   Diagnosis Date Noted  . Persistent atrial fibrillation ([place]) 09/12/2019  . CAD (coronary artery disease) 09/12/2019  . Chronic venous stasis 09/12/2019  . Hoarding disorder 09/12/2019  . Cervical spinal stenosis 09/12/2019  . Diabetic retinopathy ([place]) 09/12/2019  . COPD, mild ([place]) 09/12/2019  . Osteoporosis 09/09/2019  . History of prostate cancer 09/09/2019  . Anxiety 08/10/2019  . Obstructive sleep apnea on CPAP 08/10/2019  . Hyperlipidemia associated with type 2 diabetes mellitus ([place]) 08/10/2019  . Chronic venous insufficiency 09/10/2014  . Chronic diastolic CHF (congestive heart failure) ([place]) 01/09/2014  . Esophageal dysmotility 09/12/2013  . Type 2 diabetes mellitus with proteinuria ([place]) 02/14/2012  . Obesity, morbid (more than 100 lbs over ideal weight or BMI > 40) ([place]) 07/26/2011  . OLD MYOCARDIAL INFARCTION 03/02/2010  . Hypertension associated with diabetes ([place]) 03/20/2009  . CORONARY ATHEROSCLEROSIS, ARTERY BYPASS  GRAFT 03/20/2009    Past Surgical Hx Past Surgical History:  Procedure Laterality Date  . CARDIAC CATHETERIZATION    . CATARACT EXTRACTION Left   . CATARACT EXTRACTION W/PHACO Right 05/03/2017   Procedure: CATARACT EXTRACTION PHACO AND INTRAOCULAR LENS PLACEMENT (IOC);  Surgeon: Leandrew Koyanagi, MD;  Location: [facility] ORS;  Service: Ophthalmology;  Laterality: Right;  [place] 00:35.3 AP% 12.3 CDE 4.33 Fluid Pack lot # [id number]       . CORONARY ANGIOPLASTY WITH STENT PLACEMENT  2002  . CORONARY ANGIOPLASTY  WITH STENT PLACEMENT  1999  . CORONARY ARTERY BYPASS GRAFT     x7  . ESOPHAGOGASTRODUODENOSCOPY N/A 09/19/2016   Procedure: ESOPHAGOGASTRODUODENOSCOPY (EGD);  Surgeon: Lollie Sails, MD;  Location: [facility] ENDOSCOPY;  Service: Endoscopy;  Laterality: N/A;    Medications Prior to Admission medications   Medication Sig Start Date End Date Taking? Authorizing Provider  acetaminophen (TYLENOL) 500 MG tablet Take 500 mg by mouth every 6 (six) hours as needed.    [provider]  aspirin 81 MG EC tablet Take 81 mg by mouth daily.     [provider]  budesonide-formoterol (SYMBICORT) 80-4.5 MCG/ACT inhaler Inhale 2 puffs into the lungs 2 (two) times daily.    [provider]  cholecalciferol (VITAMIN D3) 25 MCG (1000 UNIT) tablet Take 1 tablet (1,000 Units total) by mouth daily. 09/09/19   [name], Henrine Screws T, NP  Emollient (EUCERIN) lotion Apply topically as needed for dry skin. Apply to legs and arms daily to help with dry skin. 09/09/19   [name], Henrine Screws T, NP  empagliflozin (JARDIANCE) 10 MG TABS tablet Take 10 mg by mouth daily. 10/10/19   [name], Henrine Screws T, NP  fluticasone (FLONASE) 50 MCG/ACT nasal spray Place 2 sprays into both nostrils daily.  12/31/13   [provider]  furosemide (LASIX) 40 MG tablet Take 1 tablet (40 mg total) by mouth 2 (two) times daily. Take 1 tablet (40 mg) by mouth twice a day 10/10/19   [name], Henrine Screws T, NP  ipratropium-albuterol (DUONEB) 0.5-2.5 (3) MG/3ML SOLN Take 3 mLs by nebulization every 4 (four) hours as needed.    [provider]  lisinopril (ZESTRIL) 5 MG tablet Take 2 tablets (10 mg total) by mouth daily. 10/10/19   [name], Henrine Screws T, NP  metoprolol succinate (TOPROL XL) 50 MG 24 hr tablet Take 1 tablet (50 mg total) by mouth daily. 10/10/19   [name], Henrine Screws T, NP  nitroGLYCERIN (NITROSTAT) 0.4 MG SL tablet Place 1 tablet (0.4 mg total) under the tongue every 5 (five) minutes as needed for chest pain. 10/10/19   [name],  Henrine Screws T, NP  omeprazole (PRILOSEC) 20 MG capsule Take 1 capsule (20 mg total) by mouth daily. 10/10/19   [name], [name] T, NP  PROAIR HFA 108 (90 BASE) MCG/ACT inhaler Inhale 1 puff into the lungs every 4 (four) hours as needed.  12/22/13   [provider]  risperiDONE (RISPERDAL) 0.5 MG tablet Take 1 tablet (0.5 mg total) by mouth 2 (two) times daily. 10/10/19   [name], Henrine Screws T, NP  rivaroxaban (XARELTO) 20 MG TABS tablet Take 1 tablet (20 mg total) by mouth at bedtime. 10/10/19   [name], Henrine Screws T, NP  rosuvastatin (CRESTOR) 20 MG tablet Take 2 tablets (40 mg total) by mouth at bedtime. 10/10/19   [name] T, NP    Allergies Penicillin g, Sulfa antibiotics, Tiotropium, and Zoloft [sertraline hcl]  Family Hx Family History  Problem Relation Age of Onset  . Heart attack  Mother   . Hypertension Mother   . Hyperlipidemia Mother   . Heart attack Brother 19       MI  . Coronary artery disease Other     Social Hx Social History   Tobacco Use  . Smoking status: Never Smoker  . Smokeless tobacco: Never Used  Substance Use Topics  . Alcohol use: No  . Drug use: No     Review of Systems  Constitutional: Negative for fever, chills. Eyes: Negative for visual changes. ENT: Negative for sore throat. Cardiovascular: Negative for chest pain. Respiratory: Negative for shortness of breath. Gastrointestinal: Negative for nausea, vomiting. + indigestion Genitourinary: Negative for dysuria. Musculoskeletal: + leg pain Skin: Negative for rash. Neurological: Negative for headaches.   Physical Exam  Vital Signs: ED Triage Vitals [10/27/19 1524]  Enc Vitals Group     BP (!) 166/89     Pulse Rate (!) 102     Resp 18     Temp 98.4 F (36.9 C)     Temp src      SpO2 98 %     Weight 239 lb (108.4 kg)     Height 5\' 5"  (1.651 m)     Head Circumference      Peak Flow      Pain Score 4     Pain Loc      Pain Edu?      Excl. in [place]?     Constitutional: Alert and  oriented. Overweight.   Head: Normocephalic. Atraumatic. Eyes: Conjunctivae clear, sclera anicteric. Pupils equal and symmetric. Nose: No masses or lesions. No congestion or rhinorrhea. Mouth/Throat: Wearing mask.  Neck: No stridor. Trachea midline.  Cardiovascular: Normal rate, regular rhythm. Extremities well perfused. Respiratory: Normal respiratory effort.  Lungs CTAB. Gastrointestinal: Soft. Non-distended. Non-tender.  Genitourinary: Deferred. Musculoskeletal: Mild TTP to the distal R lower leg. No obvious deformities. Perhaps just trace edema to the anterior shin area compared to the contralateral side. 2+ R DP pulse. Able to flex/extend at R knee and ankle without issue. Toes WWP. Compartments soft and compressible.  Neurologic:  Normal speech and language. No gross focal or lateralizing neurologic deficits are appreciated. 5/5 left knee flexion/extension, ankle dorsi/plantarflexion. Skin: Mild darkness/ecchymosis vs chronic stasis/dermatitis changes to the distal R lower leg.  Psychiatric: Mood and affect are appropriate for situation.  EKG  Personally reviewed and interpreted by myself.   Rate: 96 Rhythm: atrial fibrillation Axis: normal Intervals: prolonged QTc 502 ms PVCs Atrial fibrillation, rate controlled Appears largely unchanged compared to prior March 2021 No STEMI    Radiology  [place] RLE IMPRESSION:  No evidence of deep venous thrombosis.   XR R tib/fib  IMPRESSION:  Negative.    Procedures  Procedure(s) performed (including critical care):  Procedures   Initial Impression / Assessment and Plan / MDM / ED Course  64 y.o. male who presents to the ED for R lower leg pain, indigestion  Exam as above. Will obtain XR to r/o fracture or other injury related to fall, as well as [place] to r/o DVT. W/ regards to his chronic indigestion, will obtain basic labs, troponin, EKG to r/o atypical ACS. Social situation as above, again, patient states he has a safe place  to live and declines social work consult at this time.   Work up unremarkable, negative [place] and XR. HS trop negative. As such, patient stable for discharge w/ outpatient follow up. He is agreeable. Given return precautions.    _______________________________  As part  of my medical decision making I have reviewed available labs, radiology tests, reviewed old records.  Final Clinical Impression(s) / ED Diagnosis  Final diagnoses:  Indigestion  Pain of right lower leg       Note:  This document was prepared using Dragon voice recognition software and may include unintentional dictation errors.   Lilia Pro., MD 10/28/19 [phone]

## 2019-10-27 NOTE — Patient Instructions (Signed)
Visit Information  Goals Addressed            This Visit's Progress    RNCM: I have a lot of health problems but I manage okay       CARE PLAN ENTRY (see longtitudinal plan of care for additional care plan information)  Current Barriers:   Chronic Disease Management support, education, and care coordination needs related to Atrial Fibrillation, CHF, CAD, HTN, HLD, and DMII  Clinical Goal(s) related to Atrial Fibrillation, CHF, CAD, HTN, HLD, and DMII:  Over the next 120 days, patient will:   Work with the care management team to address educational, disease management, and care coordination needs   Begin or continue self health monitoring activities as directed today  Measure and record blood pressure 3 times per week, and Measure and record weight daily, DMII under control at this time as evidence by Hemoglobin A1C at 6.6 and last fasting blood sugar on labs of 113  Call provider office for new or worsened signs and symptoms related to Hypoglycemia or Hyperglycemia, Blood pressure findings outside established parameters, Weight outside established parameters, and New or worsened symptom related to Chronic Health Conditions  Call care management team with questions or concerns  Verbalize basic understanding of patient centered plan of care established today  Interventions related to Atrial Fibrillation, CHF, CAD, HTN, HLD, and DMII:   Evaluation of current treatment plans and patient's adherence to plan as established by provider  Assessed patient understanding of disease states  Assessed patient's education and care coordination needs- discussed taking blood pressures (patient does not have a cuff- discussed the OTC product book through [facility] and the monitoring program through [facility]).    Assessed the patient's need for blood glucose testing on a daily basis.  The patient's most recent hemoglobin A1C is 6.6 and fasting blood sugar was 113.  Will continue to monitor for  changes  Education to the patient about calling [facility] Customer service line to ask about Hardin Negus life alert system or comparable system since he lives alone    Provided disease specific education to patient- education on the benefits of daily monitoring of VS and daily weight checks to maintain health and well being  Education on DASH diet and ADA diet- written information provided through the myChart function and EMMI   Evaluation: The patient states that he has been evicted and he needs help with transportation so he can sign an appeal. C3 guide referral for help with transportation, impending eviction.  The patient may have to go to a shelter to stay. Discussed with the LCSW.  The LCSW has had 2 failed attempts at reaching the patient today. C3 care guide referral has been placed.   Evaluation of the patients multiple medical complaints as expressed to a nurse over the weekend. The patient denies any nausea, vomiting, or other health concerns. His biggest concern today is the need to sign an appeal so he can stay in his house. The RNCM has reviewed for changes in his medical condition or worsening signs and symptoms of chronic conditions to be evaluated in the ER.  The patient verbalized understanding.   Collaborated with appropriate clinical care team members regarding patient needs- patient is already involved with LCSW and Pharmacist, will continue to monitor for changes in condition and support.  Patient Self Care Activities related to Atrial Fibrillation, CHF, CAD, HTN, HLD, and DMII:   Patient is unable to independently self-manage chronic health conditions  Please see past updates related  to this goal by clicking on the "Past Updates" button in the selected goal         Patient verbalizes understanding of instructions provided today.   The care management team will reach out to the patient again over the next 60 days.   Noreene Larsson RN, MSN, [facility] Mobile: [phone]

## 2019-10-27 NOTE — Chronic Care Management (AMB) (Signed)
Chronic Care Management   Follow Up Note   10/27/2019 Name: Evan [name] MRN: [id number] DOB: 03-12-1956  Referred by: [name], NP Reason for referral : Chronic Care Management (Incoming call from the patient- he has been evicted and needs help)   [name] is a 64 y.o. year old male who is a primary care patient of [name], [name], NP. The CCM team was consulted for assistance with chronic disease management and care coordination needs.    Review of patient status, including review of consultants reports, relevant laboratory and other test results, and collaboration with appropriate care team members and the patient's provider was performed as part of comprehensive patient evaluation and provision of chronic care management services.    SDOH (Social Determinants of Health) assessments performed: Yes See Care Plan activities for detailed interventions related to [facility])     Outpatient Encounter Medications as of 10/27/2019  Medication Sig  . acetaminophen (TYLENOL) 500 MG tablet Take 500 mg by mouth every 6 (six) hours as needed.  Marland Kitchen aspirin 81 MG EC tablet Take 81 mg by mouth daily.   . budesonide-formoterol (SYMBICORT) 80-4.5 MCG/ACT inhaler Inhale 2 puffs into the lungs 2 (two) times daily.  . cholecalciferol (VITAMIN D3) 25 MCG (1000 UNIT) tablet Take 1 tablet (1,000 Units total) by mouth daily.  . Emollient (EUCERIN) lotion Apply topically as needed for dry skin. Apply to legs and arms daily to help with dry skin.  Marland Kitchen empagliflozin (JARDIANCE) 10 MG TABS tablet Take 10 mg by mouth daily.  . fluticasone (FLONASE) 50 MCG/ACT nasal spray Place 2 sprays into both nostrils daily.   . furosemide (LASIX) 40 MG tablet Take 1 tablet (40 mg total) by mouth 2 (two) times daily. Take 1 tablet (40 mg) by mouth twice a day  . ipratropium-albuterol (DUONEB) 0.5-2.5 (3) MG/3ML SOLN Take 3 mLs by nebulization every 4 (four) hours as needed.  Marland Kitchen lisinopril (ZESTRIL) 5 MG tablet Take  2 tablets (10 mg total) by mouth daily.  . metoprolol succinate (TOPROL XL) 50 MG 24 hr tablet Take 1 tablet (50 mg total) by mouth daily.  . nitroGLYCERIN (NITROSTAT) 0.4 MG SL tablet Place 1 tablet (0.4 mg total) under the tongue every 5 (five) minutes as needed for chest pain.  Marland Kitchen omeprazole (PRILOSEC) 20 MG capsule Take 1 capsule (20 mg total) by mouth daily.  Marland Kitchen PROAIR HFA 108 (90 BASE) MCG/ACT inhaler Inhale 1 puff into the lungs every 4 (four) hours as needed.   . risperiDONE (RISPERDAL) 0.5 MG tablet Take 1 tablet (0.5 mg total) by mouth 2 (two) times daily.  . rivaroxaban (XARELTO) 20 MG TABS tablet Take 1 tablet (20 mg total) by mouth at bedtime.  . rosuvastatin (CRESTOR) 20 MG tablet Take 2 tablets (40 mg total) by mouth at bedtime.   No facility-administered encounter medications on file as of 10/27/2019.     Objective:   Goals Addressed            This Visit's Progress   . RNCM: I have a lot of health problems but I manage okay       CARE PLAN ENTRY (see longtitudinal plan of care for additional care plan information)  Current Barriers:  . Chronic Disease Management support, education, and care coordination needs related to Atrial Fibrillation, CHF, CAD, HTN, HLD, and DMII  Clinical Goal(s) related to Atrial Fibrillation, CHF, CAD, HTN, HLD, and DMII:  Over the next 120 days, patient will:  .  Work with the care management team to address educational, disease management, and care coordination needs  . Begin or continue self health monitoring activities as directed today  Measure and record blood pressure 3 times per week, and Measure and record weight daily, DMII under control at this time as evidence by Hemoglobin A1C at 6.6 and last fasting blood sugar on labs of 113 . Call provider office for new or worsened signs and symptoms related to Hypoglycemia or Hyperglycemia, Blood pressure findings outside established parameters, Weight outside established parameters, and New or  worsened symptom related to Chronic Health Conditions . Call care management team with questions or concerns . Verbalize basic understanding of patient centered plan of care established today  Interventions related to Atrial Fibrillation, CHF, CAD, HTN, HLD, and DMII:  . Evaluation of current treatment plans and patient's adherence to plan as established by provider . Assessed patient understanding of disease states . Assessed patient's education and care coordination needs- discussed taking blood pressures (patient does not have a cuff- discussed the OTC product book through [facility] and the monitoring program through [facility]).   . Assessed the patient's need for blood glucose testing on a daily basis.  The patient's most recent hemoglobin A1C is 6.6 and fasting blood sugar was 113.  Will continue to monitor for changes . Education to the patient about calling [facility] Customer service line to ask about Hardin Negus life alert system or comparable system since he lives alone   . Provided disease specific education to patient- education on the benefits of daily monitoring of VS and daily weight checks to maintain health and well being . Education on DASH diet and ADA diet- written information provided through the myChart function and EMMI  . Evaluation: The patient states that he has been evicted and he needs help with transportation so he can sign an appeal. C3 guide referral for help with transportation, impending eviction.  The patient may have to go to a shelter to stay. Discussed with the LCSW.  The LCSW has had 2 failed attempts at reaching the patient today. C3 care guide referral has been placed.  . Evaluation of the patients multiple medical complaints as expressed to a nurse over the weekend. The patient denies any nausea, vomiting, or other health concerns. His biggest concern today is the need to sign an appeal so he can stay in his house. The RNCM has reviewed for changes in his medical condition or  worsening signs and symptoms of chronic conditions to be evaluated in the ER.  The patient verbalized understanding.  Nash Dimmer with appropriate clinical care team members regarding patient needs- patient is already involved with LCSW and Pharmacist, will continue to monitor for changes in condition and support.  Patient Self Care Activities related to Atrial Fibrillation, CHF, CAD, HTN, HLD, and DMII:  . Patient is unable to independently self-manage chronic health conditions  Please see past updates related to this goal by clicking on the "Past Updates" button in the selected goal          Plan:   The care management team will reach out to the patient again over the next 60 days.    Noreene Larsson RN, MSN, [facility] Mobile: [phone]

## 2019-10-27 NOTE — Discharge Instructions (Addendum)
Thank you for letting us take care of you in the emergency department today.  ° °Please continue to take any regular, prescribed medications.  ° °Please follow up with: °- Your primary care doctor to review your ER visit and follow up on your symptoms.  ° °Please return to the ER for any new or worsening symptoms.  ° °

## 2019-10-27 NOTE — Chronic Care Management (AMB) (Signed)
  Care Management   Follow Up Note   10/27/2019 Name: TERRYL TUBB MRN: [id number] DOB: 1955/08/09  Referred by: [name], NP Reason for referral : [place] is a 64 y.o. year old male who is a primary care patient of [name], [name], NP. The care management team was consulted for assistance with care management and care coordination needs.    Review of patient status, including review of consultants reports, relevant laboratory and other test results, and collaboration with appropriate care team members and the patient's provider was performed as part of comprehensive patient evaluation and provision of chronic care management services.    LCSW had several missed calls and voice messages from patient. LCSW completed CCM outreach attempt today but was unable to reach patient successfully on the number patient contacted LCSW on. Number reports that is no longer in working service. LCSW was not able to leave a voice message. LCSW will complete next outreach attempt on 11/03/19.    The care management team will reach out to the patient again over the next 7 days.   [name], BSW, MSW, [facility] Care Management [email or web address] Phone: [phone]

## 2019-10-27 NOTE — ED Notes (Signed)
Pt transported to [facility]

## 2019-10-27 NOTE — ED Triage Notes (Signed)
Pt comes via [facility] from home with c/o vomiting and blurred vision. Pt states floaters.  Pt states this started awhile ago. Pt states he came today bc his PCP told him to come.  Pt states diarrhea.

## 2019-10-27 NOTE — ED Notes (Signed)
Pt states he's here due to right lower leg pain. Redness is noted to the leg and patient states he's in pain.

## 2019-10-27 NOTE — Telephone Encounter (Signed)
Good morning.  Please read note below, this was my concern I had discussed, with him leaving his previous clinic who made home visits.  Now under [facility] is not getting these.  Pam or Jerene Pitch could one of you reach out to him today.  It sounds like he has a lot going on and could use CCM assistance.  IF he is in panic and having nausea, chest pain, vomiting, SOB I do want him to go straight to ER as I am concerned whether or not he is being adherent with his medications.  Thank you.

## 2019-10-28 ENCOUNTER — Ambulatory Visit: Payer: Self-pay | Admitting: Licensed Clinical Social Worker

## 2019-10-28 NOTE — Chronic Care Management (AMB) (Signed)
  Care Management   Follow Up Note   10/28/2019 Name: ERICK AYE MRN: [id number] DOB: 18-Apr-1956  Referred by: [name], NP Reason for referral : [place] is a 64 y.o. year old male who is a primary care patient of [name], [name], NP. The care management team was consulted for assistance with care management and care coordination needs.    Review of patient status, including review of consultants reports, relevant laboratory and other test results, and collaboration with appropriate care team members and the patient's provider was performed as part of comprehensive patient evaluation and provision of chronic care management services.    LCSW received missed call and voice message from [place] at [phone] ext [phone] on 10/28/19. LCSW completed return call but was unable to reach her. LCSW left a voice message encouraging a return call in order to discuss patient's housing.  [name], BSW, MSW, [facility]/THN Care Management [email or web address] Phone: [phone]

## 2019-10-29 ENCOUNTER — Encounter: Payer: Self-pay | Admitting: Nurse Practitioner

## 2019-10-29 ENCOUNTER — Telehealth: Payer: Self-pay | Admitting: Nurse Practitioner

## 2019-10-29 ENCOUNTER — Ambulatory Visit: Payer: Self-pay | Admitting: Licensed Clinical Social Worker

## 2019-10-29 DIAGNOSIS — E1159 Type 2 diabetes mellitus with other circulatory complications: Secondary | ICD-10-CM

## 2019-10-29 DIAGNOSIS — F423 Hoarding disorder: Secondary | ICD-10-CM

## 2019-10-29 DIAGNOSIS — F419 Anxiety disorder, unspecified: Secondary | ICD-10-CM

## 2019-10-29 DIAGNOSIS — I5032 Chronic diastolic (congestive) heart failure: Secondary | ICD-10-CM

## 2019-10-29 NOTE — Chronic Care Management (AMB) (Signed)
  Care Management   Follow Up Note   10/29/2019 Name: Evan [name] MRN: [id number] DOB: 1956/04/10  Referred by: [name], NP Reason for referral : [place] is a 65 y.o. year old male who is a primary care patient of [name], [name], NP. The care management team was consulted for assistance with care management and care coordination needs.    Review of patient status, including review of consultants reports, relevant laboratory and other test results, and collaboration with appropriate care team members and the patient's provider was performed as part of comprehensive patient evaluation and provision of chronic care management services.    LCSW received voice message from [place] asking for practice to send over medical release form in order to discuss patient's current housing situation. LCSW completed return call to [employer] and left a voice message stating that the organization requiring the consent provides the form since it is for their protection and for her to fax their consent to our practice when able. LCSW updated [place].   [name], BSW, MSW, [facility]N Care Management [email or web address] Phone: [phone]

## 2019-10-29 NOTE — Chronic Care Management (AMB) (Signed)
Care Management   Follow Up Note   10/29/2019 Name: Evan [name] MRN: [id number] DOB: June 18, 1956  Referred by: Evan Lick, NP Reason for referral : Evan [name] is a 64 y.o. year old male who is a primary care patient of Evan [name], Evan Faster, NP. The care management team was consulted for assistance with care management and care coordination needs.    Review of patient status, including review of consultants reports, relevant laboratory and other test results, and collaboration with appropriate care team members and the patient's provider was performed as part of comprehensive patient evaluation and provision of chronic care management services.    SDOH (Social Determinants of Health) assessments performed: Yes See Care Plan activities for detailed interventions related to [name])     Advanced Directives: See Care Plan and Vynca application for related entries.   Goals Addressed    . "I need to find housing and more support." (pt-stated)       Current Barriers:  . Financial constraints related to managing health care . Limited social support . ADL IADL limitations . Social Isolation . Limited access to caregiver . Inability to perform ADL's independently . Inability to perform IADL's independently . Patient has been asked to leave his current residence due to hoarding. Patient does not know where he will reside after he has been forced to leave.   Clinical Social Work Clinical Goal(s):  Marland Kitchen Over the next 120 days, patient will work with SW to address concerns related to lack of support within the home . Over the next 120 days, patient will demonstrate improved health management independence as evidenced by implementing appropriate self-care into daily routine    Interventions:  . Patient interviewed and appropriate assessments performed-new referral . Provided patient with information about level of care options, personal care service resources and  Medicaid benefits.  . Discussed plans with patient for ongoing care management follow up and provided patient with direct contact information for care management team . Assisted patient/caregiver with obtaining information about health plan benefits . A voluntary and extensive discussion about advanced care planning including explanation and discussion of advanced was undertaken with the patient.  Explanation regarding healthcare proxy and living will was reviewed and packet with forms with explanation of how to fill them out was given.   . Provided education to patient/caregiver regarding level of care options. . Provided education to patient/caregiver about Hospice and/or Palliative Care services . Patient is new to [facility] and is in need of community resource connection and is unable to provide Medicaid number and reports that DSS informed him that they are in the process of transitioning his Medicaid from [employer] to [employer]. Patient is agreeable to [facility] referral once Medicaid information has been received.  . Emotional support provided due to ongoing stress/anxiety/anger outburst. Solution focused therapy implemented into entire session today. Marland Kitchen UPDATE- Patient reports that he received a letter in the mail from his landlord yesterday asking for him to relocate as he has too many belongings in his residence that the landlord has considered hoarding. Patient reports that his landlord has been very disrespectful to him and even laughed at him and caused him to have a fall. Patient reports that the stress caused him to pass out and fall on the pavement which led to a ED visit. LCSW completed deep breathing exercises with patient to alleviate anxiety and stress. Patient was receptive was support provided. 10/29/19- Patient arrived at [facility] front desk today  asking for assistance. Patient is very overwhelmed because he does not know where he is going to go once he is forced to leave his current property. Patient wishes  for [facility] to talk to his housing caseworker in regards to this. Patient wants to sign a consent for release for [employer]. LCSW assisted patient with completing this form and the front desk faxed document to [place]. Patient spoke briefly with CCM RNCM as well. Patient was provided list of shelters today as he may have to reside there for a few weeks until his housing caseworker can find him permanent housing through public housing. LCSW will update C3 Guide.  Patient Self Care Activities:  . Attends all scheduled provider appointments . Lacks social connections  Please see past updates related to this goal by clicking on the "Past Updates" button in the selected goal       The care management team will reach out to the patient again over the next 7 days.   [name], BSW, MSW, [facility]/THN Care Management [email or web address] Phone: [phone]

## 2019-10-29 NOTE — Telephone Encounter (Signed)
   KNB 10/29/2019  Name: Evan [name]   MRN: [id number]   DOB: [birth year]   AGE: 64 y.o.   GENDER: male   PCP [name], NP.   Called pt regarding [employer] Referral for transportation and housing. Pt stated that he has not been evicted and that he will be donating his stuff that the landlord complained about to charity [employer]) I asked about transportation and he said his neighbor Jerlyn Ly has been giving him rides. Is waiting to hear back from [facility] and will move when they find a place for him.  Care Guide will mail letter with Department of Social [employer] Transportation information and low income housing resources.  [place] . [email or web address]  [phone]

## 2019-11-03 ENCOUNTER — Ambulatory Visit: Payer: Self-pay | Admitting: Licensed Clinical Social Worker

## 2019-11-03 ENCOUNTER — Telehealth: Payer: Self-pay | Admitting: Nurse Practitioner

## 2019-11-03 DIAGNOSIS — F419 Anxiety disorder, unspecified: Secondary | ICD-10-CM

## 2019-11-03 DIAGNOSIS — I152 Hypertension secondary to endocrine disorders: Secondary | ICD-10-CM

## 2019-11-03 DIAGNOSIS — E1159 Type 2 diabetes mellitus with other circulatory complications: Secondary | ICD-10-CM

## 2019-11-03 DIAGNOSIS — F423 Hoarding disorder: Secondary | ICD-10-CM

## 2019-11-03 NOTE — Telephone Encounter (Signed)
Pt needs ride set up for hearing on the 7th incoming voicemail fw from [place]

## 2019-11-03 NOTE — Chronic Care Management (AMB) (Signed)
Care Management   Follow Up Note   11/03/2019 Name: Evan [name] MRN: [id number] DOB: 24-Jan-1956  Referred by: [name], NP Reason for referral : [name] is a 64 y.o. year old male who is a primary care patient of [name], [name], NP. The care management team was consulted for assistance with care management and care coordination needs.    Review of patient status, including review of consultants reports, relevant laboratory and other test results, and collaboration with appropriate care team members and the patient's provider was performed as part of comprehensive patient evaluation and provision of chronic care management services.    SDOH (Social Determinants of Health) assessments performed: Yes See Care Plan activities for detailed interventions related to [facility])     Advanced Directives: See Care Plan and Vynca application for related entries.   Goals Addressed    . "I need to find housing and more support." (pt-stated)       Current Barriers:  . Financial constraints related to managing health care . Limited social support . ADL IADL limitations . Social Isolation . Limited access to caregiver . Inability to perform ADL's independently . Inability to perform IADL's independently . Patient has been asked to leave his current residence due to hoarding. Patient does not know where he will reside after he has been forced to leave.   Clinical Social Work Clinical Goal(s):  Marland Kitchen Over the next 120 days, patient will work with SW to address concerns related to lack of support within the home . Over the next 120 days, patient will demonstrate improved health management independence as evidenced by implementing appropriate self-care into daily routine    Interventions:  . Patient interviewed and appropriate assessments performed-new referral . Provided patient with information about level of care options, personal care service resources and  Medicaid benefits.  . Discussed plans with patient for ongoing care management follow up and provided patient with direct contact information for care management team . Assisted patient/caregiver with obtaining information about health plan benefits . A voluntary and extensive discussion about advanced care planning including explanation and discussion of advanced was undertaken with the patient.  Explanation regarding healthcare proxy and living will was reviewed and packet with forms with explanation of how to fill them out was given.   . Provided education to patient/caregiver regarding level of care options. . Provided education to patient/caregiver about Hospice and/or Palliative Care services . Patient is new to [facility] and is in need of community resource connection and is unable to provide Medicaid number and reports that DSS informed him that they are in the process of transitioning his Medicaid from [employer] to [employer]. Patient is agreeable to [facility] referral once Medicaid information has been received.  . Emotional support provided due to ongoing stress/anxiety/anger outburst. Solution focused therapy implemented into entire session today. . Patient reports that he received a letter in the mail from his landlord yesterday asking for him to relocate as he has too many belongings in his residence that the landlord has considered hoarding. Patient reports that his landlord has been very disrespectful to him and even laughed at him and caused him to have a fall. Patient reports that the stress caused him to pass out and fall on the pavement which led to a ED visit. LCSW completed deep breathing exercises with patient to alleviate anxiety and stress. Patient was receptive was support provided. 10/29/19- Patient arrived at [facility] front desk today asking  for assistance. Patient is very overwhelmed because he does not know where he is going to go once he is forced to leave his current property. Patient wishes for [facility]  to talk to his housing caseworker in regards to this. Patient wants to sign a consent for release for [employer]. LCSW assisted patient with completing this form and the front desk faxed document to [place]. Patient spoke briefly with CCM RNCM as well. Patient was provided list of shelters today as he may have to reside there for a few weeks until his housing caseworker can find him permanent housing through public housing. LCSW will update C3 Guide. UPDATE- LCSW spoke with patient on 11/03/19 and confirmed that patient has help in the home currently cleaning his house and getting rid of several of his belongings per his landlord's order. Patient reports that he his place is no longer cluttered. He reports that he also got rid of a lot his personal items to charity. Praise provided to patient for facing is hoarding concern and taking action in order to not lose his residence. Emotional support provided.   Patient Self Care Activities:  . Attends all scheduled provider appointments . Lacks social connections  Please see past updates related to this goal by clicking on the "Past Updates" button in the selected goal       The care management team will reach out to the patient again over the next quarter,  [name], BSW, MSW, [email or web address] Phone: [phone]

## 2019-11-04 DIAGNOSIS — M1990 Unspecified osteoarthritis, unspecified site: Secondary | ICD-10-CM | POA: Diagnosis not present

## 2019-11-04 NOTE — Telephone Encounter (Signed)
   KNB 11/04/2019  Name: Evan [name]   MRN: [id number]   DOB: 02-Jul-1956   AGE: 64 y.o.   GENDER: male   PCP [name], NP.   Called pt regarding [employer] Referral for transportation. Pt stated that he spoke with landlord and she was understanding that he was trying to get his belongings cleared out and donated.  (Pt was in a much better state than my previous conversation with him last week.)  He gave me the specifics about his hearing scheduled for 10:30am April 7th - [facility] [address] [place] [address]   Phone # [phone]   Pt said he called DSS transportation but they didn't have enough lead time to get his trip scheduled and had missed the 2 week lead time window.  Gave him instructions on how to download [employer] app to his phone He stated "okay i'll download that from [place] on my phone" I told him to go ahead and do that today so that he could schedule the trip to be picked up on Wednesday 4/7 at 10:00am for his housing hearing. He said he would do that immediately, His first [employer] trip will be free.  I gave him the number for dial-a-ride through DSS so he would have it available and encouraged him to schedule his future doctor's appts, trips to grocery, etc. 2 weeks out if at all possible so he could use DSS transportation. Also let him know about [employer] but that is only for his office visits and medically related appts.  Pt asked about a conference call yesterday, I explained to Mr. Kutner that he and I did not have a phone conversation yesterday or a conference call.   He is confused about who is helping with what.   I explained what Jacelyn Pi, and Sue's roles are in his coordinated care (as well as myself) and directed him if he had any questions about his hearing on the 7th to follow up with Collie Siad directly. Pt stated that he understood.  Closing referral pending any other needs of patient.  [place] .  [email or web address]  [phone]

## 2019-11-05 ENCOUNTER — Ambulatory Visit: Payer: Self-pay | Admitting: Licensed Clinical Social Worker

## 2019-11-05 ENCOUNTER — Encounter: Payer: Self-pay | Admitting: Nurse Practitioner

## 2019-11-05 ENCOUNTER — Ambulatory Visit (INDEPENDENT_AMBULATORY_CARE_PROVIDER_SITE_OTHER): Payer: Medicare Other | Admitting: Nurse Practitioner

## 2019-11-05 ENCOUNTER — Other Ambulatory Visit: Payer: Self-pay

## 2019-11-05 VITALS — BP 134/72 | HR 88 | Temp 98.7°F

## 2019-11-05 DIAGNOSIS — K224 Dyskinesia of esophagus: Secondary | ICD-10-CM

## 2019-11-05 DIAGNOSIS — E1159 Type 2 diabetes mellitus with other circulatory complications: Secondary | ICD-10-CM

## 2019-11-05 DIAGNOSIS — I5032 Chronic diastolic (congestive) heart failure: Secondary | ICD-10-CM

## 2019-11-05 DIAGNOSIS — E1169 Type 2 diabetes mellitus with other specified complication: Secondary | ICD-10-CM

## 2019-11-05 DIAGNOSIS — I152 Hypertension secondary to endocrine disorders: Secondary | ICD-10-CM

## 2019-11-05 DIAGNOSIS — E785 Hyperlipidemia, unspecified: Secondary | ICD-10-CM

## 2019-11-05 DIAGNOSIS — F419 Anxiety disorder, unspecified: Secondary | ICD-10-CM

## 2019-11-05 DIAGNOSIS — I10 Essential (primary) hypertension: Secondary | ICD-10-CM

## 2019-11-05 NOTE — Chronic Care Management (AMB) (Signed)
  Care Management   Follow Up Note   11/05/2019 Name: Evan [name] MRN: [id number] DOB: 24-Dec-1955  Referred by: [name], NP Reason for referral : [name] is a 64 y.o. year old male who is a primary care patient of [name], [name], NP. The care management team was consulted for assistance with care management and care coordination needs.    Review of patient status, including review of consultants reports, relevant laboratory and other test results, and collaboration with appropriate care team members and the patient's provider was performed as part of comprehensive patient evaluation and provision of chronic care management services.    LCSW received fax from [employer] on 11/05/19 with a signed consent form for patient. LCSW completed care coordination call to patient's caseworker but was unable to reach her successfully. LCSW left a voice message encouraging her to return call back in order to coordinate care now that consents have been signed.  [name], BSW, MSW, [facility]/THN Care Management [email or web address] Phone: [phone]

## 2019-11-05 NOTE — Assessment & Plan Note (Signed)
Chronic, ongoing.  Continue current medication regimen and adjust as needed.  Return in 2 months for visit and labs.

## 2019-11-05 NOTE — Patient Instructions (Signed)
Carbohydrate Counting for Diabetes Mellitus, Adult  Carbohydrate counting is a method of keeping track of how many carbohydrates you eat. Eating carbohydrates naturally increases the amount of sugar (glucose) in the blood. Counting how many carbohydrates you eat helps keep your blood glucose within normal limits, which helps you manage your diabetes (diabetes mellitus). It is important to know how many carbohydrates you can safely have in each meal. This is different for every person. A diet and nutrition specialist (registered dietitian) can help you make a meal plan and calculate how many carbohydrates you should have at each meal and snack. Carbohydrates are found in the following foods:  Grains, such as breads and cereals.  Dried beans and soy products.  Starchy vegetables, such as potatoes, peas, and corn.  Fruit and fruit juices.  Milk and yogurt.  Sweets and snack foods, such as cake, cookies, candy, chips, and soft drinks. How do I count carbohydrates? There are two ways to count carbohydrates in food. You can use either of the methods or a combination of both. Reading "Nutrition Facts" on packaged food The "Nutrition Facts" list is included on the labels of almost all packaged foods and beverages in the [place] It includes:  The serving size.  Information about nutrients in each serving, including the grams (g) of carbohydrate per serving. To use the "Nutrition Facts":  Decide how many servings you will have.  Multiply the number of servings by the number of carbohydrates per serving.  The resulting number is the total amount of carbohydrates that you will be having. Learning standard serving sizes of other foods When you eat carbohydrate foods that are not packaged or do not include "Nutrition Facts" on the label, you need to measure the servings in order to count the amount of carbohydrates:  Measure the foods that you will eat with a food scale or measuring cup, if  needed.  Decide how many standard-size servings you will eat.  Multiply the number of servings by 15. Most carbohydrate-rich foods have about 15 g of carbohydrates per serving. ? For example, if you eat 8 oz (170 g) of strawberries, you will have eaten 2 servings and 30 g of carbohydrates (2 servings x 15 g = 30 g).  For foods that have more than one food mixed, such as soups and casseroles, you must count the carbohydrates in each food that is included. The following list contains standard serving sizes of common carbohydrate-rich foods. Each of these servings has about 15 g of carbohydrates:   hamburger bun or  English muffin.   oz (15 mL) syrup.   oz (14 g) jelly.  1 slice of bread.  1 six-inch tortilla.  3 oz (85 g) cooked rice or pasta.  4 oz (113 g) cooked dried beans.  4 oz (113 g) starchy vegetable, such as peas, corn, or potatoes.  4 oz (113 g) hot cereal.  4 oz (113 g) mashed potatoes or  of a large baked potato.  4 oz (113 g) canned or frozen fruit.  4 oz (120 mL) fruit juice.  4-6 crackers.  6 chicken nuggets.  6 oz (170 g) unsweetened dry cereal.  6 oz (170 g) plain fat-free yogurt or yogurt sweetened with artificial sweeteners.  8 oz (240 mL) milk.  8 oz (170 g) fresh fruit or one small piece of fruit.  24 oz (680 g) popped popcorn. Example of carbohydrate counting Sample meal  3 oz (85 g) chicken breast.  6 oz (170 g)   brown rice.  4 oz (113 g) corn.  8 oz (240 mL) milk.  8 oz (170 g) strawberries with sugar-free whipped topping. Carbohydrate calculation 1. Identify the foods that contain carbohydrates: ? Rice. ? Corn. ? Milk. ? Strawberries. 2. Calculate how many servings you have of each food: ? 2 servings rice. ? 1 serving corn. ? 1 serving milk. ? 1 serving strawberries. 3. Multiply each number of servings by 15 g: ? 2 servings rice x 15 g = 30 g. ? 1 serving corn x 15 g = 15 g. ? 1 serving milk x 15 g = 15 g. ? 1  serving strawberries x 15 g = 15 g. 4. Add together all of the amounts to find the total grams of carbohydrates eaten: ? 30 g + 15 g + 15 g + 15 g = 75 g of carbohydrates total. Summary  Carbohydrate counting is a method of keeping track of how many carbohydrates you eat.  Eating carbohydrates naturally increases the amount of sugar (glucose) in the blood.  Counting how many carbohydrates you eat helps keep your blood glucose within normal limits, which helps you manage your diabetes.  A diet and nutrition specialist (registered dietitian) can help you make a meal plan and calculate how many carbohydrates you should have at each meal and snack. This information is not intended to replace advice given to you by your health care provider. Make sure you discuss any questions you have with your health care provider. Document Revised: 02/15/2017 Document Reviewed: 01/05/2016 Elsevier Patient Education  2020 Elsevier Inc.  

## 2019-11-05 NOTE — Assessment & Plan Note (Signed)
Chronic, ongoing with improvement with Prilosec.  Continue this regimen.  Recommend heavy focus on diet and avoiding foods that agitate symptoms. Keep food diary.  Return to office in 2 months for visit and labs.  Plan on short period PPI and will trial off in future. Refuses referral for colonoscopy today.

## 2019-11-05 NOTE — Assessment & Plan Note (Signed)
Chronic, ongoing with some grandiose thought processes present, possible schizophrenia on presentation.  Continue current medication regimen and adjust as needed.  Has history on review old records of missing doses Seroquel, will continue current medication at this time and adjust as needed.  Continue collaboration with CCM SW.  Would benefit from psychiatry and therapy, but he refuses these.  Will continue to encourage this and discuss with him. 

## 2019-11-05 NOTE — Progress Notes (Signed)
BP 134/72 (BP Location: Left Arm)   Pulse 88   Temp 98.7 F (37.1 C) (Oral)   SpO2 98%    Subjective:    Patient ID: Evan [name], male    DOB: Dec 14, 1955, 64 y.o.   MRN: [id number]  HPI: Evan [name] is a 64 y.o. male  Chief Complaint  Patient presents with  . Follow-up   HYPERTENSION / [facility] on 10/01/19, no changes made, and saw Dr. [name] on 09/16/19.  Recent EF 08/28/2019 was 50 to 55%.  Uses CPAP at home about 70% of the time.  Is to be taking Lisinopril 10 MG daily, Xarelto, Lasix 40 MG BID, Crestor 20 MG daily, Metoprolol XL 50 MG daily.  Last visit he had not been taking Crestor, but reports now is taking it, had picked it up.  Reports he did have an episode of vertigo about a month ago when in an argument with his landlord, EMS came and his BP was elevated he reports.  He did go to ER that day on 10/27/2019 -- imaging negative + labs unremarkable at visit. Satisfied with current treatment? yes Duration of hypertension: chronic BP monitoring frequency: not checking BP range:  BP medication side effects: no Duration of hyperlipidemia: chronic Cholesterol medication side effects: no Cholesterol supplements: none Medication compliance: good compliance Aspirin: yes Recent stressors: no Recurrent headaches: no Visual changes: no Palpitations: no Dyspnea: no Chest pain: no Lower extremity edema: no Dizzy/lightheaded: no   GERD Has been followed by GI in past for dysphagia, last visit with EGD November 2017. Started on Prilosec last visit, which he reports has offered benefit. GERD control status: uncontrolled  Satisfied with current treatment? no Heartburn frequency: 1-2 times a week Medication side effects: no  Medication compliance: stable Previous GERD medications: Zantac Antacid use frequency:  none Nature: burning Location: epigastric Heartburn duration: 1 hour Alleviatiating factors:  TUMS sometimes Aggravating factors: certain  foods Dysphagia: no Odynophagia:  no Hematemesis: no Blood in stool: no EGD: yes   ANXIETY/STRESS Continues on Seroquel for mood, which he reports is helpful.  He discussed today his recent stressors, as landlord was going to kick him out of home due to "hoarding", but he has now cleaned his whole house and sold a lot of items, showed pictures to provider of his home.  Is working with CCM team on getting help with aide in home.  On review of past clinic notes he does have history of missing doses of his Seroquel frequently.  At this time he reports taking this.  Discussed with him referral to psychiatry, which would be beneficial, but he refuses. Duration:stable Anxious mood: no  Excessive worrying: no Irritability: no  Sweating: no Nausea: no Palpitations:no Hyperventilation: no Panic attacks: no Agoraphobia: no  Obscessions/compulsions: yes Depressed mood: no Depression screen [facility] 2/9 11/05/2019 10/07/2019 09/09/2019 08/11/2019 03/01/2018  Decreased Interest 0 0 0 0 0  Down, Depressed, Hopeless 0 0 0 0 0  PHQ - 2 Score 0 0 0 0 0  Altered sleeping 0 0 0 0 -  Tired, decreased energy 0 0 0 0 -  Change in appetite 0 0 0 0 -  Feeling bad or failure about yourself  0 0 0 0 -  Trouble concentrating 0 0 0 0 -  Moving slowly or fidgety/restless 0 0 0 0 -  Suicidal thoughts 0 0 0 0 -  PHQ-9 Score 0 0 0 0 -  Difficult doing work/chores Not difficult at  all Not difficult at all Not difficult at all - -   GAD 7 : Generalized Anxiety Score 11/05/2019 10/01/2019 09/09/2019 08/20/2019  Nervous, Anxious, on Edge 1 0 0 0  Control/stop worrying 0 0 0 0  Worry too much - different things 1 0 0 0  Trouble relaxing 1 0 0 0  Restless 0 0 0 0  Easily annoyed or irritable 0 0 0 0  Afraid - awful might happen 0 0 0 0  Total GAD 7 Score 3 0 0 0  Anxiety Difficulty Not difficult at all Not difficult at all Not difficult at all Not difficult at all     Relevant past medical, surgical, family and social  history reviewed and updated as indicated. Interim medical history since our last visit reviewed. Allergies and medications reviewed and updated.  Review of Systems  Constitutional: Negative for activity change, diaphoresis, fatigue and fever.  Respiratory: Negative for cough, chest tightness, shortness of breath and wheezing.   Cardiovascular: Positive for leg swelling (baseline per his report). Negative for chest pain and palpitations.  Gastrointestinal: Negative for abdominal distention, abdominal pain, constipation, diarrhea, nausea and vomiting.  Endocrine: Negative for cold intolerance, heat intolerance, polydipsia, polyphagia and polyuria.  Neurological: Negative for dizziness, syncope, weakness, light-headedness, numbness and headaches.  Psychiatric/Behavioral: Negative for decreased concentration, self-injury, sleep disturbance and suicidal ideas. The patient is nervous/anxious.     Per HPI unless specifically indicated above     Objective:    BP 134/72 (BP Location: Left Arm)   Pulse 88   Temp 98.7 F (37.1 C) (Oral)   SpO2 98%   Wt Readings from Last 3 Encounters:  10/27/19 239 lb (108.4 kg)  10/13/19 239 lb (108.4 kg)  10/01/19 236 lb 12.8 oz (107.4 kg)    Physical Exam Vitals and nursing note reviewed.  Constitutional:      General: He is awake. He is not in acute distress.    Appearance: He is well-developed. He is morbidly obese. He is not ill-appearing.  HENT:     Head: Normocephalic and atraumatic.     Right Ear: Hearing normal. No drainage.     Left Ear: Hearing normal. No drainage.  Eyes:     General: Lids are normal.        Right eye: No discharge.        Left eye: No discharge.     Pupils: Pupils are equal, round, and reactive to light.  Neck:     Thyroid: No thyromegaly.     Vascular: No carotid bruit or JVD.     Trachea: Trachea normal.  Cardiovascular:     Rate and Rhythm: Normal rate and regular rhythm.     Heart sounds: Normal heart sounds, S1  normal and S2 normal. No murmur. No gallop.   Pulmonary:     Effort: Pulmonary effort is normal. No accessory muscle usage or respiratory distress.     Breath sounds: Normal breath sounds.  Abdominal:     General: Bowel sounds are normal.     Palpations: Abdomen is soft.     Tenderness: There is no abdominal tenderness.  Musculoskeletal:        General: Normal range of motion.     Cervical back: Normal range of motion and neck supple.     Right lower leg: No edema.     Left lower leg: No edema.  Lymphadenopathy:     Cervical: No cervical adenopathy.  Skin:    General: Skin  is warm and dry.     Capillary Refill: Capillary refill takes less than 2 seconds.     Findings: No rash.  Neurological:     Mental Status: He is alert and oriented to person, place, and time.     Gait: Gait is intact.  Psychiatric:        Attention and Perception: Attention normal.        Mood and Affect: Mood normal.        Speech: Speech normal.        Behavior: Behavior normal. Behavior is cooperative.     Comments: Tangential thought processes.  Talked about his friendships with Reather Laurence, Virgel Gess, and [place].  This is baseline for him.     Results for orders placed or performed during the hospital encounter of 10/27/19  Lipase, blood  Result Value Ref Range   Lipase 26 11 - 51 U/L  Comprehensive metabolic panel  Result Value Ref Range   Sodium 137 135 - 145 mmol/L   Potassium 3.9 3.5 - 5.1 mmol/L   Chloride 103 98 - 111 mmol/L   CO2 23 22 - 32 mmol/L   Glucose, Bld 118 (H) 70 - 99 mg/dL   BUN 18 8 - 23 mg/dL   Creatinine, Ser 0.83 0.61 - 1.24 mg/dL   Calcium 9.2 8.9 - 10.3 mg/dL   Total Protein 7.6 6.5 - 8.1 g/dL   Albumin 4.1 3.5 - 5.0 g/dL   AST 17 15 - 41 U/L   ALT 17 0 - 44 U/L   Alkaline Phosphatase 91 38 - 126 U/L   Total Bilirubin 0.9 0.3 - 1.2 mg/dL   GFR calc non Af Amer >60 >60 mL/min   GFR calc Af Amer >60 >60 mL/min   Anion gap 11 5 - 15  CBC  Result Value Ref  Range   WBC 7.3 4.0 - 10.5 K/uL   RBC 4.91 4.22 - 5.81 MIL/uL   Hemoglobin 13.5 13.0 - 17.0 g/dL   HCT 43.1 39.0 - 52.0 %   MCV 87.8 80.0 - 100.0 fL   MCH 27.5 26.0 - 34.0 pg   MCHC 31.3 30.0 - 36.0 g/dL   RDW 15.5 11.5 - 15.5 %   Platelets 281 150 - 400 K/uL   nRBC 0.0 0.0 - 0.2 %  Troponin I (High Sensitivity)  Result Value Ref Range   Troponin I (High Sensitivity) 13 <18 ng/L      Assessment & Plan:   Problem List Items Addressed This Visit      Cardiovascular and Mediastinum   Hypertension associated with diabetes (HCC)    Chronic, ongoing.  BP initially elevated, but repeat improved.  Recommend checking these at home a few mornings a week and documenting.  Continue current medication regimen and adjust as needed + collaboration with cardiology. Return in 2 months.      Chronic diastolic CHF (congestive heart failure) (HCC) - Primary    Chronic, ongoing. Continue collaboration with cardiology and HF clinic.  Recommend: - Reminded to call for an overnight weight gain of >2 pounds or a weekly weight weight of >5 pounds - not adding salt to his food and has been reading food labels. Reviewed the importance of keeping daily sodium intake to 2000mg  daily  - Return in 2 months for visit and labs.        Digestive   Esophageal dysmotility    Chronic, ongoing with improvement with Prilosec.  Continue this regimen.  Recommend heavy focus on diet and avoiding foods that agitate symptoms. Keep food diary.  Return to office in 2 months for visit and labs.  Plan on short period PPI and will trial off in future. Refuses referral for colonoscopy today.        Endocrine   Hyperlipidemia associated with type 2 diabetes mellitus (HCC)    Chronic, ongoing.  Continue current medication regimen and adjust as needed.  Return in 2 months for visit and labs.        Other   Anxiety    Chronic, ongoing with some grandiose thought processes present, possible schizophrenia on presentation.   Continue current medication regimen and adjust as needed.  Has history on review old records of missing doses Seroquel, will continue current medication at this time and adjust as needed.  Continue collaboration with CCM SW.  Would benefit from psychiatry and therapy, but he refuses these.  Will continue to encourage this and discuss with him.          Follow up plan: Return for should be scheduled for May -- check on this.

## 2019-11-05 NOTE — Assessment & Plan Note (Signed)
Chronic, ongoing.  BP initially elevated, but repeat improved.  Recommend checking these at home a few mornings a week and documenting.  Continue current medication regimen and adjust as needed + collaboration with cardiology. Return in 2 months.

## 2019-11-05 NOTE — Assessment & Plan Note (Signed)
Chronic, ongoing. Continue collaboration with cardiology and HF clinic.  Recommend: - Reminded to call for an overnight weight gain of >2 pounds or a weekly weight weight of >5 pounds - not adding salt to his food and has been reading food labels. Reviewed the importance of keeping daily sodium intake to 2000mg  daily  - Return in 2 months for visit and labs.

## 2019-11-10 ENCOUNTER — Ambulatory Visit: Payer: Self-pay | Admitting: Licensed Clinical Social Worker

## 2019-11-10 NOTE — Chronic Care Management (AMB) (Signed)
Care Management   Follow Up Note   11/10/2019 Name: Evan [name] MRN: [id number] DOB: [birth year]  Referred by: [name], NP Reason for referral : Evan [name] is a 64 y.o. year old male who is a primary care patient of [name], [name], NP. The care management team was consulted for assistance with care management and care coordination needs.    Review of patient status, including review of consultants reports, relevant laboratory and other test results, and collaboration with appropriate care team members and the patient's provider was performed as part of comprehensive patient evaluation and provision of chronic care management services.    Goals Addressed    . "I need to find housing and more support." (pt-stated)       Current Barriers:  . Financial constraints related to managing health care . Limited social support . ADL IADL limitations . Social Isolation . Limited access to caregiver . Inability to perform ADL's independently . Inability to perform IADL's independently . Patient has been asked to leave his current residence due to hoarding. Patient does not know where he will reside after he has been forced to leave.   Clinical Social Work Clinical Goal(s):  Marland Kitchen Over the next 120 days, patient will work with SW to address concerns related to lack of support within the home . Over the next 120 days, patient will demonstrate improved health management independence as evidenced by implementing appropriate self-care into daily routine    Interventions:  . Patient interviewed and appropriate assessments performed-new referral . Provided patient with information about level of care options, personal care service resources and Medicaid benefits.  . Discussed plans with patient for ongoing care management follow up and provided patient with direct contact information for care management team . Assisted patient/caregiver with obtaining information  about health plan benefits . A voluntary and extensive discussion about advanced care planning including explanation and discussion of advanced was undertaken with the patient.  Explanation regarding healthcare proxy and living will was reviewed and packet with forms with explanation of how to fill them out was given.   . Provided education to patient/caregiver regarding level of care options. . Provided education to patient/caregiver about Hospice and/or Palliative Care services . Patient is new to [facility] and is in need of community resource connection and is unable to provide Medicaid number and reports that DSS informed him that they are in the process of transitioning his Medicaid from [employer] to [employer]. Patient is agreeable to [facility] referral once Medicaid information has been received.  . Emotional support provided due to ongoing stress/anxiety/anger outburst. Solution focused therapy implemented into entire session today. . Patient reports that he received a letter in the mail from his landlord yesterday asking for him to relocate as he has too many belongings in his residence that the landlord has considered hoarding. Patient reports that his landlord has been very disrespectful to him and even laughed at him and caused him to have a fall. Patient reports that the stress caused him to pass out and fall on the pavement which led to a ED visit. LCSW completed deep breathing exercises with patient to alleviate anxiety and stress. Patient was receptive was support provided. 10/29/19- Patient arrived at [facility] front desk today asking for assistance. Patient is very overwhelmed because he does not know where he is going to go once he is forced to leave his current property. Patient wishes for [facility] to talk to his housing  caseworker in regards to this. Patient wants to sign a consent for release for [employer]. LCSW assisted patient with completing this form and the front desk faxed document to [place]. Patient  spoke briefly with CCM RNCM as well. Patient was provided list of shelters today as he may have to reside there for a few weeks until his housing caseworker can find him permanent housing through public housing. LCSW will update C3 Guide. UPDATE- LCSW spoke with patient on 11/03/19 and confirmed that patient has help in the home currently cleaning his house and getting rid of several of his belongings per his landlord's order. Patient reports that he his place is no longer cluttered. He reports that he also got rid of a lot his personal items to charity. Praise provided to patient for facing is hoarding concern and taking action in order to not lose his residence. Emotional support provided.  -LCSW received care coordination call from patient's [place]. She reports that program was unable to even get in the door at his yearly inspection and had to reschedule it. They gave patient a 30 day notice at this point. However, when they came back to complete the inspection, no progress had been made and he failed the inspection and is at risk for losing [employer]. LCSW typed a letter on behalf of patient to advocate for his situation considering his deliberating conditions and sent it to his caseworker. Patient has a hearing on 11/12/19 at 10:00 am where they will determine if he will be discharged from their services. Patient will not be able to get services through [employer] again if he is discharged from program due to unfavorable living conditions. LCSW will update entire team.   Patient Self Care Activities:  . Attends all scheduled provider appointments . Lacks social connections  Please see past updates related to this goal by clicking on the "Past Updates" button in the selected goal     [name], BSW, MSW, [email or web address] Phone: [phone]

## 2019-11-11 ENCOUNTER — Ambulatory Visit: Payer: Self-pay | Admitting: Licensed Clinical Social Worker

## 2019-11-11 NOTE — Chronic Care Management (AMB) (Signed)
Care Management   Follow Up Note   11/11/2019 Name: Evan [name] MRN: [id number] DOB: 12/05/1955  Referred by: [name], NP Reason for referral : Evan [name] is a 64 y.o. year old male who is a primary care patient of [name], [name], NP. The care management team was consulted for assistance with care management and care coordination needs.    Review of patient status, including review of consultants reports, relevant laboratory and other test results, and collaboration with appropriate care team members and the patient's provider was performed as part of comprehensive patient evaluation and provision of chronic care management services.    SDOH (Social Determinants of Health) assessments performed: Yes See Care Plan activities for detailed interventions related to [facility])    LCSW completed care coordination call only today by completing a APS report.   Advanced Directives: See Care Plan and Vyn[employer] application for related entries.   Goals Addressed    . "I need to find housing and more support." (pt-stated)       Current Barriers:  . Financial constraints related to managing health care . Limited social support . ADL IADL limitations . Social Isolation . Limited access to caregiver . Inability to perform ADL's independently . Inability to perform IADL's independently . Patient has been asked to leave his current residence due to hoarding. Patient does not know where he will reside after he has been forced to leave.   Clinical Social Work Clinical Goal(s):  Marland Kitchen Over the next 120 days, patient will work with SW to address concerns related to lack of support within the home . Over the next 120 days, patient will demonstrate improved health management independence as evidenced by implementing appropriate self-care into daily routine    Interventions:  . Patient interviewed and appropriate assessments performed-new referral . Provided patient with  information about level of care options, personal care service resources and Medicaid benefits.  . Discussed plans with patient for ongoing care management follow up and provided patient with direct contact information for care management team . Assisted patient/caregiver with obtaining information about health plan benefits . A voluntary and extensive discussion about advanced care planning including explanation and discussion of advanced was undertaken with the patient.  Explanation regarding healthcare proxy and living will was reviewed and packet with forms with explanation of how to fill them out was given.   . Provided education to patient/caregiver regarding level of care options. . Provided education to patient/caregiver about Hospice and/or Palliative Care services . Patient is new to [facility] and is in need of community resource connection and is unable to provide Medicaid number and reports that DSS informed him that they are in the process of transitioning his Medicaid from [employer] to [employer]. Patient is agreeable to [facility] referral once Medicaid information has been received.  . Emotional support provided due to ongoing stress/anxiety/anger outburst. Solution focused therapy implemented into entire session today. . Patient reports that he received a letter in the mail from his landlord yesterday asking for him to relocate as he has too many belongings in his residence that the landlord has considered hoarding. Patient reports that his landlord has been very disrespectful to him and even laughed at him and caused him to have a fall. Patient reports that the stress caused him to pass out and fall on the pavement which led to a ED visit. LCSW completed deep breathing exercises with patient to alleviate anxiety and stress. Patient was  receptive was support provided. 10/29/19- Patient arrived at [facility] front desk today asking for assistance. Patient is very overwhelmed because he does not know where he is going  to go once he is forced to leave his current property. Patient wishes for [facility] to talk to his housing caseworker in regards to this. Patient wants to sign a consent for release for [employer]. LCSW assisted patient with completing this form and the front desk faxed document to [place]. Patient spoke briefly with CCM RNCM as well. Patient was provided list of shelters today as he may have to reside there for a few weeks until his housing caseworker can find him permanent housing through public housing. LCSW will update C3 Guide. UPDATE- LCSW spoke with patient on 11/03/19 and confirmed that patient has help in the home currently cleaning his house and getting rid of several of his belongings per his landlord's order. Patient reports that he his place is no longer cluttered. He reports that he also got rid of a lot his personal items to charity. Praise provided to patient for facing is hoarding concern and taking action in order to not lose his residence. Emotional support provided.  -LCSW received care coordination call from patient's [place]. She reports that program was unable to even get in the door at his yearly inspection and had to reschedule it. They gave patient a 30 day notice at this point. However, when they came back to complete the inspection, no progress had been made and he failed the inspection and is at risk for losing [employer]. LCSW typed a letter on behalf of patient to advocate for his situation considering his deliberating conditions and sent it to his caseworker. Patient has a hearing on 11/12/19 at 10:00 am where they will determine if he will be discharged from their services. Patient will not be able to get services through [employer] again if he is discharged from program due to unfavorable living conditions. LCSW will update entire team.  -UPDATE 11/11/19- LCSW FILED APS REPORT FOR SUSPECTED NEGLECT OF SELF AND INABILITY TO LIVE IN THE  COMMUNITY INDEPENDENTLY.   Patient Self Care Activities:  . Attends all scheduled provider appointments . Lacks social connections  Please see past updates related to this goal by clicking on the "Past Updates" button in the selected goal      The care management team will reach out to the patient over the next 30 days.   [name], BSW, MSW, [facility] Care Management [email or web address] Phone: [phone]

## 2019-11-14 ENCOUNTER — Ambulatory Visit: Payer: Self-pay | Admitting: Licensed Clinical Social Worker

## 2019-11-14 NOTE — Chronic Care Management (AMB) (Signed)
  Care Management   Follow Up Note   11/14/2019 Name: Evan [name] MRN: [id number] DOB: February 04, 1956  Referred by: [name], NP Reason for referral : [name] is a 64 y.o. year old male who is a primary care patient of [name], [name], NP. The care management team was consulted for assistance with care management and care coordination needs.    Review of patient status, including review of consultants reports, relevant laboratory and other test results, and collaboration with appropriate care team members and the patient's provider was performed as part of comprehensive patient evaluation and provision of chronic care management services.    LCSW received missed call and voice message from patient yesterday evening on 11/13/19 that stated he is doing well and he wanted to express his gratitude for the letter that [facility] provided on his behalf as it helped him to keep his public housing benefits. Patient completed meeting with [facility] on [id number]. Patient reports that is still actively working on cleaning out his home and improving his hoarding habits in order to remain in compliance.   The care management team will reach out to the patient again over the next 45 days.   [name], BSW, MSW, [facility] Care Management [email or web address] Phone: [phone]

## 2019-11-24 ENCOUNTER — Ambulatory Visit (INDEPENDENT_AMBULATORY_CARE_PROVIDER_SITE_OTHER): Payer: Medicare Other | Admitting: Licensed Clinical Social Worker

## 2019-11-24 DIAGNOSIS — E785 Hyperlipidemia, unspecified: Secondary | ICD-10-CM | POA: Diagnosis not present

## 2019-11-24 DIAGNOSIS — E1159 Type 2 diabetes mellitus with other circulatory complications: Secondary | ICD-10-CM

## 2019-11-24 DIAGNOSIS — E1129 Type 2 diabetes mellitus with other diabetic kidney complication: Secondary | ICD-10-CM | POA: Diagnosis not present

## 2019-11-24 DIAGNOSIS — I5032 Chronic diastolic (congestive) heart failure: Secondary | ICD-10-CM

## 2019-11-24 DIAGNOSIS — F419 Anxiety disorder, unspecified: Secondary | ICD-10-CM

## 2019-11-24 DIAGNOSIS — I10 Essential (primary) hypertension: Secondary | ICD-10-CM | POA: Diagnosis not present

## 2019-11-24 DIAGNOSIS — I4819 Other persistent atrial fibrillation: Secondary | ICD-10-CM | POA: Diagnosis not present

## 2019-11-24 DIAGNOSIS — E1169 Type 2 diabetes mellitus with other specified complication: Secondary | ICD-10-CM

## 2019-11-24 NOTE — Chronic Care Management (AMB) (Signed)
Chronic Care Management    Clinical Social Work Follow Up Note  11/24/2019 Name: Evan [name] MRN: [id number] DOB: [birth year]  [name] Even is a 64 y.o. year old male who is a primary care patient of [name], [name], NP. The CCM team was consulted for assistance with [employer]  and Level of Care Concerns.   Review of patient status, including review of consultants reports, other relevant assessments, and collaboration with appropriate care team members and the patient's provider was performed as part of comprehensive patient evaluation and provision of chronic care management services.    SDOH (Social Determinants of Health) assessments performed: Yes   Outpatient Encounter Medications as of 11/24/2019  Medication Sig  . acetaminophen (TYLENOL) 500 MG tablet Take 500 mg by mouth every 6 (six) hours as needed.  Marland Kitchen aspirin 81 MG EC tablet Take 81 mg by mouth daily.   . budesonide-formoterol (SYMBICORT) 80-4.5 MCG/ACT inhaler Inhale 2 puffs into the lungs 2 (two) times daily.  . cholecalciferol (VITAMIN D3) 25 MCG (1000 UNIT) tablet Take 1 tablet (1,000 Units total) by mouth daily.  . Emollient (EUCERIN) lotion Apply topically as needed for dry skin. Apply to legs and arms daily to help with dry skin.  Marland Kitchen empagliflozin (JARDIANCE) 10 MG TABS tablet Take 10 mg by mouth daily.  . fluticasone (FLONASE) 50 MCG/ACT nasal spray Place 2 sprays into both nostrils daily.   . furosemide (LASIX) 40 MG tablet Take 1 tablet (40 mg total) by mouth 2 (two) times daily. Take 1 tablet (40 mg) by mouth twice a day  . ipratropium-albuterol (DUONEB) 0.5-2.5 (3) MG/3ML SOLN Take 3 mLs by nebulization every 4 (four) hours as needed.  Marland Kitchen lisinopril (ZESTRIL) 5 MG tablet Take 2 tablets (10 mg total) by mouth daily.  . metoprolol succinate (TOPROL XL) 50 MG 24 hr tablet Take 1 tablet (50 mg total) by mouth daily.  . nitroGLYCERIN (NITROSTAT) 0.4 MG SL tablet Place 1 tablet (0.4 mg total) under the tongue  every 5 (five) minutes as needed for chest pain.  Marland Kitchen omeprazole (PRILOSEC) 20 MG capsule Take 1 capsule (20 mg total) by mouth daily.  Marland Kitchen PROAIR HFA 108 (90 BASE) MCG/ACT inhaler Inhale 1 puff into the lungs every 4 (four) hours as needed.   . risperiDONE (RISPERDAL) 0.5 MG tablet Take 1 tablet (0.5 mg total) by mouth 2 (two) times daily.  . rivaroxaban (XARELTO) 20 MG TABS tablet Take 1 tablet (20 mg total) by mouth at bedtime.  . rosuvastatin (CRESTOR) 20 MG tablet Take 2 tablets (40 mg total) by mouth at bedtime.   No facility-administered encounter medications on file as of 11/24/2019.     Goals Addressed    . "I need to find housing and more support." (pt-stated)       Current Barriers:  . Financial constraints related to managing health care . Limited social support . ADL IADL limitations . Social Isolation . Limited access to caregiver . Inability to perform ADL's independently . Inability to perform IADL's independently . Patient's current residence was put at risk due to hoarding through [place]):  Marland Kitchen Over the next 120 days, patient will work with SW to address concerns related to lack of support within the home . Over the next 120 days, patient will demonstrate improved health management independence as evidenced by implementing appropriate self-care into daily routine    Interventions: . Patient interviewed and appropriate assessments performed-new referral .  Provided patient with information about level of care options, personal care service resources and Medicaid benefits.  . Discussed plans with patient for ongoing care management follow up and provided patient with direct contact information for care management team . Assisted patient/caregiver with obtaining information about health plan benefits . A voluntary and extensive discussion about advanced care planning including explanation and discussion of advanced was  undertaken with the patient.  Explanation regarding healthcare proxy and living will was reviewed and packet with forms with explanation of how to fill them out was given.   . Provided education to patient/caregiver regarding level of care options. . Provided education to patient/caregiver about Hospice and/or Palliative Care services . Patient is new to [facility] and is in need of community resource connection and is unable to provide Medicaid number and reports that DSS informed him that they are in the process of transitioning his Medicaid from [facility] to [employer]. Patient is agreeable to [facility] referral once Medicaid information has been received.  . Emotional support provided due to ongoing stress/anxiety/anger outburst. Solution focused therapy implemented into entire session today. Marland Kitchen LCSW received care coordination call from patient's [place]. She reports that program was unable to even get in the door at his yearly inspection due to hoarding and had to reschedule it. They gave patient a 30 day notice at this point. However, when they came back to complete the inspection, no progress had been made and he failed the inspection and is at risk for losing [employer]. LCSW typed a letter on behalf of patient to advocate for his situation considering his deliberating conditions and sent it to his caseworker. Patient had a hearing on 11/12/19 and they decided to allow patient another chance and did not discharge him from their program. However, patient must continue to work on his hoarding and meet their favorable living conditions requirements. LCSW will update entire team.  . LCSW FILED APS REPORT on 11/11/19 FOR SUSPECTED NEGLECT OF SELF AND INABILITY TO LIVE IN THE COMMUNITY INDEPENDENTLY.  Nurse, learning disability provided today.   Patient Self Care Activities:  . Attends all scheduled provider appointments . Lacks social connections  Please see past updates related to  this goal by clicking on the "Past Updates" button in the selected goal      Follow Up Plan: SW will follow up with patient by phone over the next quarter  [name], [place], MSW, [email or web address] Phone: [phone]

## 2019-11-26 ENCOUNTER — Ambulatory Visit: Payer: Self-pay | Admitting: Pharmacist

## 2019-11-26 DIAGNOSIS — I25810 Atherosclerosis of coronary artery bypass graft(s) without angina pectoris: Secondary | ICD-10-CM

## 2019-11-26 DIAGNOSIS — F419 Anxiety disorder, unspecified: Secondary | ICD-10-CM

## 2019-11-26 DIAGNOSIS — E1159 Type 2 diabetes mellitus with other circulatory complications: Secondary | ICD-10-CM

## 2019-11-26 DIAGNOSIS — E1129 Type 2 diabetes mellitus with other diabetic kidney complication: Secondary | ICD-10-CM

## 2019-11-26 NOTE — Chronic Care Management (AMB) (Signed)
Chronic Care Management   Follow Up Note   11/26/2019 Name: Evan [name] MRN: [id number] DOB: 1956-02-09  Referred by: Evan Lick, NP Reason for referral : Chronic Care Management (Medication Management)   Evan [name] is a 64 y.o. year old male who is a primary care patient of [name], Evan Faster, NP. The CCM team was consulted for assistance with chronic disease management and care coordination needs.    Contacted patient for medication management review.   Review of patient status, including review of consultants reports, relevant laboratory and other test results, and collaboration with appropriate care team members and the patient's provider was performed as part of comprehensive patient evaluation and provision of chronic care management services.    SDOH (Social Determinants of Health) assessments performed: No See Care Plan activities for detailed interventions related to [facility])     Outpatient Encounter Medications as of 11/26/2019  Medication Sig Note  . acetaminophen (TYLENOL) 500 MG tablet Take 500 mg by mouth every 6 (six) hours as needed.   . cholecalciferol (VITAMIN D3) 25 MCG (1000 UNIT) tablet Take 1 tablet (1,000 Units total) by mouth daily.   . empagliflozin (JARDIANCE) 10 MG TABS tablet Take 10 mg by mouth daily.   . furosemide (LASIX) 40 MG tablet Take 1 tablet (40 mg total) by mouth 2 (two) times daily. Take 1 tablet (40 mg) by mouth twice a day   . lisinopril (ZESTRIL) 5 MG tablet Take 2 tablets (10 mg total) by mouth daily. 11/26/2019: QAM  . metoprolol succinate (TOPROL XL) 50 MG 24 hr tablet Take 1 tablet (50 mg total) by mouth daily.   Marland Kitchen omeprazole (PRILOSEC) 20 MG capsule Take 1 capsule (20 mg total) by mouth daily.   . risperiDONE (RISPERDAL) 0.5 MG tablet Take 1 tablet (0.5 mg total) by mouth 2 (two) times daily.   Marland Kitchen aspirin 81 MG EC tablet Take 81 mg by mouth daily.    . Emollient (EUCERIN) lotion Apply topically as needed for dry skin. Apply to  legs and arms daily to help with dry skin.   . fluticasone (FLONASE) 50 MCG/ACT nasal spray Place 2 sprays into both nostrils daily.    Marland Kitchen ipratropium-albuterol (DUONEB) 0.5-2.5 (3) MG/3ML SOLN Take 3 mLs by nebulization every 4 (four) hours as needed.   . nitroGLYCERIN (NITROSTAT) 0.4 MG SL tablet Place 1 tablet (0.4 mg total) under the tongue every 5 (five) minutes as needed for chest pain.   . rivaroxaban (XARELTO) 20 MG TABS tablet Take 1 tablet (20 mg total) by mouth at bedtime. (Patient not taking: Reported on 11/26/2019)   . rosuvastatin (CRESTOR) 20 MG tablet Take 2 tablets (40 mg total) by mouth at bedtime. (Patient not taking: Reported on 11/26/2019)   . [DISCONTINUED] budesonide-formoterol (SYMBICORT) 80-4.5 MCG/ACT inhaler Inhale 2 puffs into the lungs 2 (two) times daily.   . [DISCONTINUED] PROAIR HFA 108 (90 BASE) MCG/ACT inhaler Inhale 1 puff into the lungs every 4 (four) hours as needed.     No facility-administered encounter medications on file as of 11/26/2019.     Objective:   Goals Addressed            This Visit's Progress     Patient Stated   . PharmD "I am confused by my medications" (pt-stated)       Current Barriers:  . Polypharmacy; complex patient with multiple comorbidities including ASCVD (hx CABG, MI); Afib, CHF, T2DM, osteoporosis, hx prostate cancer (s/p Lupron therapy, d/c d/t  behavioral disturbances). [facility] with [facility], but recently stopped with that program. Is struggling to manage medications and healthcare independently.  o Patient had requested a pill box and a list of medications which were sent to him after his last visit.  . Patient picked up a 90 day [name] of prescriptions from [place] back in February 2021. Patient was agreeable to transitioning back to adherence packaging. In conducting a med rec, patient was missing a few of his medications and most likely has not been taking: Xarelto, aspirin, rosuvastatin, Flonase. Patient to continue  his current medications as prescribed: o Afib/HTN/HFpEF: Xarelto 20 mg daily, furosemide 80 mg QAM, 40 mg QPM, lisinopril 10 mg daily, metoprolol succinate 50 mg daily,  o ASCVD risk reduction: ASA 81 mg daily, rosuvastatin 40 mg daily; per records, last stent 2002 o T2DM: Jardiance 10 mg daily; A1c at goal <7% o Osteoporosis: Last DEXA showed t score -3.1% at femoral neck.; Vitamin D 1000 units daily o Anxiety, ?schizophrenia: risperdone 0.5 mg BID o Wondered about the status of the order for the walker that was sent in February  Pharmacist Clinical Goal(s):  Marland Kitchen Over the next 90 days, patient will work with PharmD and provider towards optimized medication management  Interventions: . Comprehensive medication review performed, medication list updated in electronic medical record . Inter-disciplinary care team collaboration (see longitudinal plan of care) . Contacted Evan [name] regarding adherence packaging for patient's medications. Will fax full list of allergies and medications to Evan [name] for them to transfer from Evan [name].   . Emphasized the importance of taking his medications as prescribed and the benefits of adherence packaging.  . Patient had questions about where he was supposed to be receiving his walker from. Reviewed chart. Provided phone number for Evan [name]  Patient Self Care Activities:  . Patient will take medications as prescribed  Please see past updates related to this goal by clicking on the "Past Updates" button in the selected goal          Plan:  - Scheduled f/u call in ~6 weeks  Evan [name], PharmD, [place] [phone]

## 2019-11-26 NOTE — Patient Instructions (Signed)
Visit Information  Goals Addressed            This Visit's Progress     Patient Stated   . PharmD "I am confused by my medications" (pt-stated)       Current Barriers:  . Polypharmacy; complex patient with multiple comorbidities including ASCVD (hx CABG, MI); Afib, CHF, T2DM, osteoporosis, hx prostate cancer (s/p Lupron therapy, d/c d/t behavioral disturbances). [facility] with [facility], but recently stopped with that program. Is struggling to manage medications and healthcare independently.  o Patient had requested a pill box and a list of medications which were sent to him after his last visit.  . Patient picked up a 90 day supply of prescriptions from [place] back in February 2021. Patient was agreeable to transitioning back to adherence packaging. In conducting a med rec, patient was missing a few of his medications and most likely has not been taking: Xarelto, aspirin, rosuvastatin, Flonase. Patient to continue his current medications as prescribed: o Afib/HTN/HFpEF: Xarelto 20 mg daily, furosemide 80 mg QAM, 40 mg QPM, lisinopril 10 mg daily, metoprolol succinate 50 mg daily,  o ASCVD risk reduction: ASA 81 mg daily, rosuvastatin 40 mg daily; per records, last stent 2002 o T2DM: Jardiance 10 mg daily; A1c at goal <7% o Osteoporosis: Last DEXA showed t score -3.1% at femoral neck.; Vitamin D 1000 units daily o Anxiety, ?schizophrenia: risperdone 0.5 mg BID o Wondered about the status of the order for the walker that was sent in February  Pharmacist Clinical Goal(s):  Marland Kitchen Over the next 90 days, patient will work with PharmD and provider towards optimized medication management  Interventions: . Comprehensive medication review performed, medication list updated in electronic medical record . Inter-disciplinary care team collaboration (see longitudinal plan of care) . Contacted [facility] regarding adherence packaging for patient's medications. Will fax full list of allergies and  medications to [facility] for them to transfer from [employer].   . Emphasized the importance of taking his medications as prescribed and the benefits of adherence packaging.  . Patient had questions about where he was supposed to be receiving his walker from. Reviewed chart. Provided phone number for [facility]  Patient Self Care Activities:  . Patient will take medications as prescribed  Please see past updates related to this goal by clicking on the "Past Updates" button in the selected goal         Patient verbalizes understanding of instructions provided today.   Plan:  - Scheduled f/u call in ~6 weeks  Catie Darnelle Maffucci, PharmD, [place] [phone]

## 2019-11-27 ENCOUNTER — Ambulatory Visit: Payer: Self-pay | Admitting: Pharmacist

## 2019-11-27 DIAGNOSIS — I152 Hypertension secondary to endocrine disorders: Secondary | ICD-10-CM

## 2019-11-27 DIAGNOSIS — E1159 Type 2 diabetes mellitus with other circulatory complications: Secondary | ICD-10-CM

## 2019-11-27 DIAGNOSIS — R809 Proteinuria, unspecified: Secondary | ICD-10-CM

## 2019-11-27 DIAGNOSIS — E1129 Type 2 diabetes mellitus with other diabetic kidney complication: Secondary | ICD-10-CM

## 2019-11-27 NOTE — Patient Instructions (Signed)
Visit Information  Goals Addressed            This Visit's Progress     Patient Stated   . PharmD "I am confused by my medications" (pt-stated)       Current Barriers:  . Polypharmacy; complex patient with multiple comorbidities including ASCVD (hx CABG, MI); Afib, CHF, T2DM, osteoporosis, hx prostate cancer (s/p Lupron therapy, d/c d/t behavioral disturbances).  o Spoke w/ Estill Bamberg at [facility] to review medications. She requests a prescription on ASA 81 to place in pill boxes . Current medications as prescribed: o Afib/HTN/HFpEF: Xarelto 20 mg daily, furosemide 80 mg QAM, 40 mg QPM, lisinopril 10 mg daily, metoprolol succinate 50 mg daily,  o ASCVD risk reduction: ASA 81 mg daily, rosuvastatin 40 mg daily; per records, last stent 2002 o T2DM: Jardiance 10 mg daily; A1c at goal <7% o Osteoporosis: Last DEXA showed t score -3.1% at femoral neck.; Vitamin D 1000 units daily o Anxiety, ?schizophrenia: risperdone 0.5 mg BID o Wondered about the status of the order for the walker that was sent in February  Pharmacist Clinical Goal(s):  Marland Kitchen Over the next 90 days, patient will work with PharmD and provider towards optimized medication management  Interventions: . Will collaborate w/ PCP to send ASA 81 mg script to [facility] so that it can be added to pill packages. They note they should be able to get this all out to the patient by early next week  Patient Self Care Activities:  . Patient will take medications as prescribed  Please see past updates related to this goal by clicking on the "Past Updates" button in the selected goal         Patient verbalizes understanding of instructions provided today.   Plan:  - Will continue to collaborate w/ pharmacy, PCP, and patient as above  Catie Darnelle Maffucci, PharmD, [place] [phone]

## 2019-11-27 NOTE — Chronic Care Management (AMB) (Signed)
Chronic Care Management   Follow Up Note   11/27/2019 Name: Evan [name] MRN: [id number] DOB: Mar 03, 1956  Referred by: [name], NP Reason for referral : Chronic Care Management (Medication Management)   Evan [name] is a 64 y.o. year old male who is a primary care patient of [name], [name], NP. The CCM team was consulted for assistance with chronic disease management and care coordination needs.    Care coordination today.   Review of patient status, including review of consultants reports, relevant laboratory and other test results, and collaboration with appropriate care team members and the patient's provider was performed as part of comprehensive patient evaluation and provision of chronic care management services.    SDOH (Social Determinants of Health) assessments performed: No See Care Plan activities for detailed interventions related to [facility])     Outpatient Encounter Medications as of 11/27/2019  Medication Sig Note  . acetaminophen (TYLENOL) 500 MG tablet Take 500 mg by mouth every 6 (six) hours as needed.   Marland Kitchen aspirin 81 MG EC tablet Take 81 mg by mouth daily.    . cholecalciferol (VITAMIN D3) 25 MCG (1000 UNIT) tablet Take 1 tablet (1,000 Units total) by mouth daily.   . Emollient (EUCERIN) lotion Apply topically as needed for dry skin. Apply to legs and arms daily to help with dry skin.   Marland Kitchen empagliflozin (JARDIANCE) 10 MG TABS tablet Take 10 mg by mouth daily.   . fluticasone (FLONASE) 50 MCG/ACT nasal spray Place 2 sprays into both nostrils daily.    . furosemide (LASIX) 40 MG tablet Take 1 tablet (40 mg total) by mouth 2 (two) times daily. Take 1 tablet (40 mg) by mouth twice a day   . ipratropium-albuterol (DUONEB) 0.5-2.5 (3) MG/3ML SOLN Take 3 mLs by nebulization every 4 (four) hours as needed.   Marland Kitchen lisinopril (ZESTRIL) 5 MG tablet Take 2 tablets (10 mg total) by mouth daily. 11/26/2019: QAM  . metoprolol succinate (TOPROL XL) 50 MG 24 hr tablet Take  1 tablet (50 mg total) by mouth daily.   . nitroGLYCERIN (NITROSTAT) 0.4 MG SL tablet Place 1 tablet (0.4 mg total) under the tongue every 5 (five) minutes as needed for chest pain.   Marland Kitchen omeprazole (PRILOSEC) 20 MG capsule Take 1 capsule (20 mg total) by mouth daily.   . risperiDONE (RISPERDAL) 0.5 MG tablet Take 1 tablet (0.5 mg total) by mouth 2 (two) times daily.   . rivaroxaban (XARELTO) 20 MG TABS tablet Take 1 tablet (20 mg total) by mouth at bedtime. (Patient not taking: Reported on 11/26/2019)   . rosuvastatin (CRESTOR) 20 MG tablet Take 2 tablets (40 mg total) by mouth at bedtime. (Patient not taking: Reported on 11/26/2019)    No facility-administered encounter medications on file as of 11/27/2019.     Objective:   Goals Addressed            This Visit's Progress     Patient Stated   . PharmD "I am confused by my medications" (pt-stated)       Current Barriers:  . Polypharmacy; complex patient with multiple comorbidities including ASCVD (hx CABG, MI); Afib, CHF, T2DM, osteoporosis, hx prostate cancer (s/p Lupron therapy, d/c d/t behavioral disturbances).  o Spoke w/ Estill Bamberg at [facility] to review medications. She requests a prescription on ASA 81 to place in pill boxes . Current medications as prescribed: o Afib/HTN/HFpEF: Xarelto 20 mg daily, furosemide 80 mg QAM, 40 mg QPM, lisinopril 10 mg  daily, metoprolol succinate 50 mg daily,  o ASCVD risk reduction: ASA 81 mg daily, rosuvastatin 40 mg daily; per records, last stent 2002 o T2DM: Jardiance 10 mg daily; A1c at goal <7% o Osteoporosis: Last DEXA showed t score -3.1% at femoral neck.; Vitamin D 1000 units daily o Anxiety, ?schizophrenia: risperdone 0.5 mg BID o Wondered about the status of the order for the walker that was sent in February  Pharmacist Clinical Goal(s):  Marland Kitchen Over the next 90 days, patient will work with PharmD and provider towards optimized medication management  Interventions: . Will collaborate w/ PCP to  send ASA 81 mg script to [facility] so that it can be added to pill packages. They note they should be able to get this all out to the patient by early next week  Patient Self Care Activities:  . Patient will take medications as prescribed  Please see past updates related to this goal by clicking on the "Past Updates" button in the selected goal          Plan:  - Will continue to collaborate w/ pharmacy, PCP, and patient as above  Catie Darnelle Maffucci, PharmD, [place] [phone]

## 2019-11-28 ENCOUNTER — Other Ambulatory Visit: Payer: Self-pay | Admitting: Nurse Practitioner

## 2019-11-28 MED ORDER — ASPIRIN 81 MG PO TBEC: 81.0000 mg | DELAYED_RELEASE_TABLET | Freq: Every day | ORAL | 3 refills | Status: DC

## 2019-12-02 ENCOUNTER — Ambulatory Visit: Payer: Self-pay | Admitting: Pharmacist

## 2019-12-02 DIAGNOSIS — E1129 Type 2 diabetes mellitus with other diabetic kidney complication: Secondary | ICD-10-CM | POA: Diagnosis not present

## 2019-12-02 DIAGNOSIS — E1159 Type 2 diabetes mellitus with other circulatory complications: Secondary | ICD-10-CM

## 2019-12-02 DIAGNOSIS — I4819 Other persistent atrial fibrillation: Secondary | ICD-10-CM

## 2019-12-02 DIAGNOSIS — I5032 Chronic diastolic (congestive) heart failure: Secondary | ICD-10-CM | POA: Diagnosis not present

## 2019-12-02 DIAGNOSIS — I25810 Atherosclerosis of coronary artery bypass graft(s) without angina pectoris: Secondary | ICD-10-CM

## 2019-12-02 DIAGNOSIS — E1169 Type 2 diabetes mellitus with other specified complication: Secondary | ICD-10-CM

## 2019-12-02 DIAGNOSIS — I25118 Atherosclerotic heart disease of native coronary artery with other forms of angina pectoris: Secondary | ICD-10-CM

## 2019-12-02 DIAGNOSIS — I10 Essential (primary) hypertension: Secondary | ICD-10-CM

## 2019-12-02 DIAGNOSIS — E785 Hyperlipidemia, unspecified: Secondary | ICD-10-CM

## 2019-12-02 DIAGNOSIS — R809 Proteinuria, unspecified: Secondary | ICD-10-CM

## 2019-12-02 DIAGNOSIS — F419 Anxiety disorder, unspecified: Secondary | ICD-10-CM

## 2019-12-02 NOTE — Chronic Care Management (AMB) (Signed)
Chronic Care Management   Follow Up Note   12/02/2019 Name: Evan [name] MRN: [id number] DOB: 02-09-1956  Referred by: [name], NP Reason for referral : Chronic Care Management (Medication management)   Evan [name] is a 64 y.o. year old male who is a primary care patient of [name], [name], NP. The CCM team was consulted for assistance with chronic disease management and care coordination needs.    Received phone call from patient today.   Review of patient status, including review of consultants reports, relevant laboratory and other test results, and collaboration with appropriate care team members and the patient's provider was performed as part of comprehensive patient evaluation and provision of chronic care management services.    SDOH (Social Determinants of Health) assessments performed: No See Care Plan activities for detailed interventions related to [facility])     Outpatient Encounter Medications as of 12/02/2019  Medication Sig Note  . acetaminophen (TYLENOL) 500 MG tablet Take 500 mg by mouth every 6 (six) hours as needed.   Marland Kitchen aspirin 81 MG EC tablet Take 1 tablet (81 mg total) by mouth daily.   . cholecalciferol (VITAMIN D3) 25 MCG (1000 UNIT) tablet Take 1 tablet (1,000 Units total) by mouth daily.   . Emollient (EUCERIN) lotion Apply topically as needed for dry skin. Apply to legs and arms daily to help with dry skin.   Marland Kitchen empagliflozin (JARDIANCE) 10 MG TABS tablet Take 10 mg by mouth daily.   . fluticasone (FLONASE) 50 MCG/ACT nasal spray Place 2 sprays into both nostrils daily.    . furosemide (LASIX) 40 MG tablet Take 1 tablet (40 mg total) by mouth 2 (two) times daily. Take 1 tablet (40 mg) by mouth twice a day   . ipratropium-albuterol (DUONEB) 0.5-2.5 (3) MG/3ML SOLN Take 3 mLs by nebulization every 4 (four) hours as needed.   Marland Kitchen lisinopril (ZESTRIL) 5 MG tablet Take 2 tablets (10 mg total) by mouth daily. 11/26/2019: QAM  . metoprolol succinate  (TOPROL XL) 50 MG 24 hr tablet Take 1 tablet (50 mg total) by mouth daily.   . nitroGLYCERIN (NITROSTAT) 0.4 MG SL tablet Place 1 tablet (0.4 mg total) under the tongue every 5 (five) minutes as needed for chest pain.   Marland Kitchen omeprazole (PRILOSEC) 20 MG capsule Take 1 capsule (20 mg total) by mouth daily.   . risperiDONE (RISPERDAL) 0.5 MG tablet Take 1 tablet (0.5 mg total) by mouth 2 (two) times daily.   . rivaroxaban (XARELTO) 20 MG TABS tablet Take 1 tablet (20 mg total) by mouth at bedtime. (Patient not taking: Reported on 11/26/2019)   . rosuvastatin (CRESTOR) 20 MG tablet Take 2 tablets (40 mg total) by mouth at bedtime. (Patient not taking: Reported on 11/26/2019)    No facility-administered encounter medications on file as of 12/02/2019.     Objective:   Goals Addressed            This Visit's Progress     Patient Stated   . PharmD "I am confused by my medications" (pt-stated)       Current Barriers:  . Polypharmacy; complex patient with multiple comorbidities including ASCVD (hx CABG, MI); Afib, CHF, T2DM, osteoporosis, hx prostate cancer (s/p Lupron therapy, d/c d/t behavioral disturbances).  o Received voicemail from patient today that he spoke with [facility] today, they should be delivering his medications in pill packaging tomorrow.  . Current medications as prescribed: o Afib/HTN/HFpEF: Xarelto 20 mg daily, furosemide 80 mg  QAM, 40 mg QPM, lisinopril 10 mg daily, metoprolol succinate 50 mg daily,  o ASCVD risk reduction: ASA 81 mg daily, rosuvastatin 40 mg daily; per records, last stent 2002 o T2DM: Jardiance 10 mg daily; A1c at goal <7% o Osteoporosis: Last DEXA showed t score -3.1% at femoral neck.; Vitamin D 1000 units daily o Anxiety, ?schizophrenia: risperdone 0.5 mg BID o Wondered about the status of the order for the walker that was sent in February  Pharmacist Clinical Goal(s):  Marland Kitchen Over the next 90 days, patient will work with PharmD and provider towards optimized  medication management  Interventions: . Will follow up as previously scheduled  Patient Self Care Activities:  . Patient will take medications as prescribed  Please see past updates related to this goal by clicking on the "Past Updates" button in the selected goal          Plan:  - Will follow up as previously scheduled  Catie Darnelle Maffucci, PharmD, [place] [phone]

## 2019-12-02 NOTE — Patient Instructions (Signed)
Visit Information  Goals Addressed            This Visit's Progress     Patient Stated   . PharmD "I am confused by my medications" (pt-stated)       Current Barriers:  . Polypharmacy; complex patient with multiple comorbidities including ASCVD (hx CABG, MI); Afib, CHF, T2DM, osteoporosis, hx prostate cancer (s/p Lupron therapy, d/c d/t behavioral disturbances).  o Received voicemail from patient today that he spoke with [facility] today, they should be delivering his medications in pill packaging tomorrow.  . Current medications as prescribed: o Afib/HTN/HFpEF: Xarelto 20 mg daily, furosemide 80 mg QAM, 40 mg QPM, lisinopril 10 mg daily, metoprolol succinate 50 mg daily,  o ASCVD risk reduction: ASA 81 mg daily, rosuvastatin 40 mg daily; per records, last stent 2002 o T2DM: Jardiance 10 mg daily; A1c at goal <7% o Osteoporosis: Last DEXA showed t score -3.1% at femoral neck.; Vitamin D 1000 units daily o Anxiety, ?schizophrenia: risperdone 0.5 mg BID o Wondered about the status of the order for the walker that was sent in February  Pharmacist Clinical Goal(s):  Marland Kitchen Over the next 90 days, patient will work with PharmD and provider towards optimized medication management  Interventions: . Will follow up as previously scheduled  Patient Self Care Activities:  . Patient will take medications as prescribed  Please see past updates related to this goal by clicking on the "Past Updates" button in the selected goal         Patient verbalizes understanding of instructions provided today.   Plan:  - Will follow up as previously scheduled  Catie Darnelle Maffucci, PharmD, [place] [phone]

## 2019-12-04 ENCOUNTER — Encounter: Payer: Self-pay | Admitting: Nurse Practitioner

## 2019-12-04 DIAGNOSIS — I700 Atherosclerosis of aorta: Secondary | ICD-10-CM | POA: Insufficient documentation

## 2019-12-08 ENCOUNTER — Ambulatory Visit: Payer: Medicaid Other | Admitting: Nurse Practitioner

## 2019-12-08 ENCOUNTER — Ambulatory Visit: Payer: Self-pay | Admitting: Licensed Clinical Social Worker

## 2019-12-08 DIAGNOSIS — I25118 Atherosclerotic heart disease of native coronary artery with other forms of angina pectoris: Secondary | ICD-10-CM

## 2019-12-08 DIAGNOSIS — F419 Anxiety disorder, unspecified: Secondary | ICD-10-CM

## 2019-12-08 DIAGNOSIS — F423 Hoarding disorder: Secondary | ICD-10-CM

## 2019-12-08 NOTE — Chronic Care Management (AMB) (Signed)
Care Management   Care Coordination Note   12/08/2019 Name: Evan [name] MRN: [id number] DOB: 05-22-1956  Referred by: [name], NP Reason for referral : Evan [name] is a 64 y.o. year old male who is a primary care patient of [name], Evan Faster, NP. The care management team was consulted for assistance with care management and care coordination needs.    Review of patient status, including review of consultants reports, relevant laboratory and other test results, and collaboration with appropriate care team members and the patient's provider was performed as part of comprehensive patient evaluation and provision of chronic care management services.    SDOH (Social Determinants of Health) assessments performed: Yes See Care Plan activities for detailed interventions related to [facility])   Advanced Directives: See Care Plan and Vynca application for related entries.   Goals Addressed    . SW-"I need more help." (pt-stated)       Current Barriers:  . Financial constraints related to managing health care . Limited social support . ADL IADL limitations . Social Isolation . Limited access to caregiver . Inability to perform ADL's independently . Inability to perform IADL's independently . Patient's current residence was put at risk due to hoarding through [place]):  Marland Kitchen Over the next 120 days, patient will work with SW to address concerns related to lack of support within the home . Over the next 120 days, patient will demonstrate improved health management independence as evidenced by implementing appropriate self-care into daily routine    Interventions: . Patient interviewed and appropriate assessments performed-new referral . Provided patient with information about level of care options, personal care service resources and Medicaid benefits.  . Discussed plans with patient for ongoing care management  follow up and provided patient with direct contact information for care management team . Assisted patient/caregiver with obtaining information about health plan benefits . A voluntary and extensive discussion about advanced care planning including explanation and discussion of advanced was undertaken with the patient.  Explanation regarding healthcare proxy and living will was reviewed and packet with forms with explanation of how to fill them out was given.   . Provided education to patient/caregiver regarding level of care options. . Provided education to patient/caregiver about Hospice and/or Palliative Care services . Patient is new to [facility] and is in need of community resource connection and is unable to provide Medicaid number and reports that DSS informed him that they are in the process of transitioning his Medicaid from [facility] to [employer]. Patient is agreeable to [facility] referral once Medicaid information has been received.  . Emotional support provided due to ongoing stress/anxiety/anger outburst. Solution focused therapy implemented into entire session today. Marland Kitchen LCSW received care coordination call from patient's [place]. She reports that program was unable to even get in the door at his yearly inspection due to hoarding and had to reschedule it. They gave patient a 30 day notice at this point. However, when they came back to complete the inspection, no progress had been made and he failed the inspection and is at risk for losing [employer]. LCSW typed a letter on behalf of patient to advocate for his situation considering his deliberating conditions and sent it to his caseworker. Patient had a hearing on 11/12/19 and they decided to allow patient another chance and did not discharge him from their program. However, patient must continue to work on his  hoarding and meet their favorable living conditions requirements. LCSW will update entire team.  . LCSW FILED APS REPORT on  11/11/19 FOR SUSPECTED NEGLECT OF SELF AND INABILITY TO LIVE IN THE COMMUNITY INDEPENDENTLY.  Marland Kitchen UPDATE- LCSW completed call to patient's assigned APS caseworker Synetta Fail at [phone] on 12/08/19 and discussed patient's case. Lolita Patella reports that patient has made progress with getting rid of a lot of his belongings that he was hoarding but admits that his home "still has a long way to go" and "is still a mess." Caseworker has been working with patient since 11/11/19 and reports that she is going to offer patient their treatment services but he will have to be agreeable to this service. APS caseworker reports that her biggest concern is the fact that patient is not taking his medications as prescribed. LCSW informed her that our clinic Pharmacist is now involved. LCSW will update clinic Pharmacist. Lolita Patella agrees to contact CCM LCSW if any urgent current arise regarding this case.    Patient Self Care Activities:  . Attends all scheduled provider appointments . Lacks social connections  Please see past updates related to this goal by clicking on the "Past Updates" button in the selected goal       The care management team will reach out to the patient again over the next 45 days.   [name], BSW, MSW, [facility] Care Management [email or web address] Phone: [phone]

## 2019-12-09 ENCOUNTER — Telehealth: Payer: Self-pay

## 2019-12-10 ENCOUNTER — Ambulatory Visit (INDEPENDENT_AMBULATORY_CARE_PROVIDER_SITE_OTHER): Payer: Medicare Other | Admitting: Nurse Practitioner

## 2019-12-10 ENCOUNTER — Other Ambulatory Visit: Payer: Self-pay

## 2019-12-10 ENCOUNTER — Encounter: Payer: Self-pay | Admitting: Nurse Practitioner

## 2019-12-10 ENCOUNTER — Telehealth: Payer: Self-pay | Admitting: Nurse Practitioner

## 2019-12-10 ENCOUNTER — Ambulatory Visit (INDEPENDENT_AMBULATORY_CARE_PROVIDER_SITE_OTHER): Payer: Medicare Other | Admitting: Licensed Clinical Social Worker

## 2019-12-10 VITALS — BP 128/86 | HR 88 | Temp 98.3°F | Wt 232.6 lb

## 2019-12-10 DIAGNOSIS — F419 Anxiety disorder, unspecified: Secondary | ICD-10-CM

## 2019-12-10 DIAGNOSIS — E1129 Type 2 diabetes mellitus with other diabetic kidney complication: Secondary | ICD-10-CM

## 2019-12-10 DIAGNOSIS — E785 Hyperlipidemia, unspecified: Secondary | ICD-10-CM | POA: Diagnosis not present

## 2019-12-10 DIAGNOSIS — E1159 Type 2 diabetes mellitus with other circulatory complications: Secondary | ICD-10-CM | POA: Diagnosis not present

## 2019-12-10 DIAGNOSIS — E1169 Type 2 diabetes mellitus with other specified complication: Secondary | ICD-10-CM | POA: Diagnosis not present

## 2019-12-10 DIAGNOSIS — I5032 Chronic diastolic (congestive) heart failure: Secondary | ICD-10-CM

## 2019-12-10 DIAGNOSIS — I4819 Other persistent atrial fibrillation: Secondary | ICD-10-CM | POA: Diagnosis not present

## 2019-12-10 DIAGNOSIS — I10 Essential (primary) hypertension: Secondary | ICD-10-CM

## 2019-12-10 DIAGNOSIS — I700 Atherosclerosis of aorta: Secondary | ICD-10-CM

## 2019-12-10 DIAGNOSIS — Z9989 Dependence on other enabling machines and devices: Secondary | ICD-10-CM

## 2019-12-10 DIAGNOSIS — G4733 Obstructive sleep apnea (adult) (pediatric): Secondary | ICD-10-CM

## 2019-12-10 DIAGNOSIS — I152 Hypertension secondary to endocrine disorders: Secondary | ICD-10-CM

## 2019-12-10 DIAGNOSIS — R809 Proteinuria, unspecified: Secondary | ICD-10-CM

## 2019-12-10 DIAGNOSIS — C61 Malignant neoplasm of prostate: Secondary | ICD-10-CM | POA: Insufficient documentation

## 2019-12-10 LAB — BAYER DCA HB A1C WAIVED: HB A1C (BAYER DCA - WAIVED): 6.3 % (ref <7.0)

## 2019-12-10 NOTE — Assessment & Plan Note (Signed)
Chronic, ongoing.  Continue collaboration with cardiology and current medication regimen, including Metoprolol and Xarelto.  BMP today. 

## 2019-12-10 NOTE — Assessment & Plan Note (Signed)
Chronic, ongoing. Continue collaboration with cardiology and HF clinic. Recommend he take medication consistently. Recommend: - Reminded to call for an overnight weight gain of >2 pounds or a weekly weight weight of >5 pounds - not adding salt to his food and has been reading food labels. Reviewed the importance of keeping daily sodium intake to <2000mg daily  - Return in 2 months for visit and labs. 

## 2019-12-10 NOTE — Telephone Encounter (Signed)
Noted  

## 2019-12-10 NOTE — Assessment & Plan Note (Signed)
Recommend consistent use of CPAP daily, he reports using every night. 

## 2019-12-10 NOTE — Patient Instructions (Signed)

## 2019-12-10 NOTE — Progress Notes (Signed)
BP 128/86 (BP Location: Left Arm, Patient Position: Sitting)   Pulse 88   Temp 98.3 F (36.8 C) (Oral)   Wt 232 lb 9.6 oz (105.5 kg)   SpO2 97%   BMI 38.71 kg/m    Subjective:    Patient ID: Evan [name], male    DOB: 04-01-1956, 64 y.o.   MRN: [id number]  HPI: Evan [name] is a 64 y.o. male  Chief Complaint  Patient presents with  . Diabetes  . Hyperlipidemia  . Hypertension   DIABETES Continues on Jardiance 10 MG daily.  A1C in February 6.6% Hypoglycemic episodes:no Polydipsia/polyuria: no Visual disturbance: no Chest pain: no Paresthesias: no Glucose Monitoring: no             Accucheck frequency: Not Checking             Fasting glucose:             Post prandial:             Evening:             Before meals: Taking Insulin?: no             Long acting insulin:             Short acting insulin: Blood Pressure Monitoring: not checking Retinal Examination: Up to Date Foot Exam: Up to Date Pneumovax: unknown Influenza: unknown Aspirin: yes   HYPERTENSION / HYPERLIPIDEMIA Saw HF Clinic on 10/01/19, no changes made.  Saw cardiology on 09/16/19. Recent EF 08/28/2019 was 50 to 55%.  Uses CPAP at home about 100% of the time. Satisfied with current treatment? yes Duration of hypertension: chronic BP monitoring frequency: not checking BP range:  BP medication side effects: no Duration of hyperlipidemia: chronic Cholesterol medication side effects: no Cholesterol supplements: none Medication compliance: good compliance Aspirin: yes Recent stressors: no Recurrent headaches: no Visual changes: no Palpitations: no Dyspnea: no Chest pain: no Lower extremity edema: no Dizzy/lightheaded: no   ANXIETY/STRESS Continues on Seroquel for mood, which he reports is helpful. He reports his current living situation is "tearing his nerves all to pieces" because "they are trying to kick me out and people are selling cocaine".  [facility] there was concern  he did not take medication consistently, noted during nursing visits on review of notes, there continues to be concern about this, although has pill packs now.  He brought pill packs today and he has taken all scheduled slots for this month so far.  He continues to refuse referral to psychiatry and continues to discuss his friendships with multiple different famous people. Duration:stable Anxious mood: no  Excessive worrying: no Irritability: no  Sweating: no Nausea: no Palpitations:no Hyperventilation: no Panic attacks: no Agoraphobia: no  Obscessions/compulsions: yes Depressed mood: no Depression screen [facility] 2/9 12/10/2019 11/05/2019 10/07/2019 09/09/2019 08/11/2019  Decreased Interest 0 0 0 0 0  Down, Depressed, Hopeless 0 0 0 0 0  PHQ - 2 Score 0 0 0 0 0  Altered sleeping 0 0 0 0 0  Tired, decreased energy 0 0 0 0 0  Change in appetite 0 0 0 0 0  Feeling bad or failure about yourself  0 0 0 0 0  Trouble concentrating 0 0 0 0 0  Moving slowly or fidgety/restless 0 0 0 0 0  Suicidal thoughts 0 0 0 0 0  PHQ-9 Score 0 0 0 0 0  Difficult doing work/chores - Not difficult at all Not  difficult at all Not difficult at all -   GAD 7 : Generalized Anxiety Score 12/10/2019 11/05/2019 10/01/2019 09/09/2019  Nervous, Anxious, on Edge 1 1 0 0  Control/stop worrying 1 0 0 0  Worry too much - different things 1 1 0 0  Trouble relaxing 0 1 0 0  Restless 0 0 0 0  Easily annoyed or irritable 1 0 0 0  Afraid - awful might happen 0 0 0 0  Total GAD 7 Score 4 3 0 0  Anxiety Difficulty Somewhat difficult Not difficult at all Not difficult at all Not difficult at all    Relevant past medical, surgical, family and social history reviewed and updated as indicated. Interim medical history since our last visit reviewed. Allergies and medications reviewed and updated.  Review of Systems  Constitutional: Negative for activity change, diaphoresis, fatigue and fever.  Respiratory: Negative for cough, chest  tightness, shortness of breath and wheezing.   Cardiovascular: Positive for leg swelling (baseline per his report). Negative for chest pain and palpitations.  Gastrointestinal: Negative for abdominal distention, abdominal pain, constipation, diarrhea, nausea and vomiting.  Endocrine: Negative for cold intolerance, heat intolerance, polydipsia, polyphagia and polyuria.  Neurological: Negative for dizziness, syncope, weakness, light-headedness, numbness and headaches.  Psychiatric/Behavioral: Negative for decreased concentration, self-injury, sleep disturbance and suicidal ideas. The patient is nervous/anxious.     Per HPI unless specifically indicated above     Objective:    BP 128/86 (BP Location: Left Arm, Patient Position: Sitting)   Pulse 88   Temp 98.3 F (36.8 C) (Oral)   Wt 232 lb 9.6 oz (105.5 kg)   SpO2 97%   BMI 38.71 kg/m   Wt Readings from Last 3 Encounters:  12/10/19 232 lb 9.6 oz (105.5 kg)  10/27/19 239 lb (108.4 kg)  10/13/19 239 lb (108.4 kg)    Physical Exam Vitals and nursing note reviewed.  Constitutional:      General: He is awake. He is not in acute distress.    Appearance: He is well-developed. He is morbidly obese. He is not ill-appearing.  HENT:     Head: Normocephalic and atraumatic.     Right Ear: Hearing normal. No drainage.     Left Ear: Hearing normal. No drainage.  Eyes:     General: Lids are normal.        Right eye: No discharge.        Left eye: No discharge.     Pupils: Pupils are equal, round, and reactive to light.  Neck:     Thyroid: No thyromegaly.     Vascular: No carotid bruit or JVD.     Trachea: Trachea normal.  Cardiovascular:     Rate and Rhythm: Normal rate and regular rhythm.     Heart sounds: Normal heart sounds, S1 normal and S2 normal. No murmur. No gallop.      Comments: Sunburn noted bilateral legs, reports he had walked to [employer] yesterday in shorts. Pulmonary:     Effort: Pulmonary effort is normal. No accessory  muscle usage or respiratory distress.     Breath sounds: Normal breath sounds.  Abdominal:     General: Bowel sounds are normal.     Palpations: Abdomen is soft.     Tenderness: There is no abdominal tenderness.  Musculoskeletal:        General: Normal range of motion.     Cervical back: Normal range of motion and neck supple.     Right lower leg:  Edema (trace) present.     Left lower leg: Edema (trace) present.  Lymphadenopathy:     Cervical: No cervical adenopathy.  Skin:    General: Skin is warm and dry.     Capillary Refill: Capillary refill takes less than 2 seconds.     Findings: No rash.  Neurological:     Mental Status: He is alert and oriented to person, place, and time.     Gait: Gait is intact.  Psychiatric:        Attention and Perception: Attention normal.        Mood and Affect: Mood normal.        Speech: Speech normal.        Behavior: Behavior normal. Behavior is cooperative.     Comments: Tangential thought processes.  Talked about his friendships with Reather Laurence, [name], and [place].  This is baseline for him.    Results for orders placed or performed during the hospital encounter of 10/27/19  Lipase, blood  Result Value Ref Range   Lipase 26 11 - 51 U/L  Comprehensive metabolic panel  Result Value Ref Range   Sodium 137 135 - 145 mmol/L   Potassium 3.9 3.5 - 5.1 mmol/L   Chloride 103 98 - 111 mmol/L   CO2 23 22 - 32 mmol/L   Glucose, Bld 118 (H) 70 - 99 mg/dL   BUN 18 8 - 23 mg/dL   Creatinine, Ser 0.83 0.61 - 1.24 mg/dL   Calcium 9.2 8.9 - 10.3 mg/dL   Total Protein 7.6 6.5 - 8.1 g/dL   Albumin 4.1 3.5 - 5.0 g/dL   AST 17 15 - 41 U/L   ALT 17 0 - 44 U/L   Alkaline Phosphatase 91 38 - 126 U/L   Total Bilirubin 0.9 0.3 - 1.2 mg/dL   GFR calc non Af Amer >60 >60 mL/min   GFR calc Af Amer >60 >60 mL/min   Anion gap 11 5 - 15  CBC  Result Value Ref Range   WBC 7.3 4.0 - 10.5 K/uL   RBC 4.91 4.22 - 5.81 MIL/uL   Hemoglobin 13.5 13.0  - 17.0 g/dL   HCT 43.1 39.0 - 52.0 %   MCV 87.8 80.0 - 100.0 fL   MCH 27.5 26.0 - 34.0 pg   MCHC 31.3 30.0 - 36.0 g/dL   RDW 15.5 11.5 - 15.5 %   Platelets 281 150 - 400 K/uL   nRBC 0.0 0.0 - 0.2 %  Troponin I (High Sensitivity)  Result Value Ref Range   Troponin I (High Sensitivity) 13 <18 ng/L      Assessment & Plan:   Problem List Items Addressed This Visit      Cardiovascular and Mediastinum   Hypertension associated with diabetes (HCC)    Chronic, ongoing.  BP initially elevated, but repeat improved, was initially very agitated and anxious.  Recommend checking these at home a few mornings a week and documenting.  Continue current medication regimen and adjust as needed + collaboration with cardiology. Return in 3 months.      Relevant Medications   rosuvastatin (CRESTOR) 40 MG tablet   lisinopril (ZESTRIL) 10 MG tablet   Other Relevant Orders   Bayer DCA Hb A1c Waived   Basic metabolic panel   Chronic diastolic CHF (congestive heart failure) (HCC)    Chronic, ongoing. Continue collaboration with cardiology and HF clinic. Recommend he take medication consistently. Recommend: - Reminded to call for an overnight weight  gain of >2 pounds or a weekly weight weight of >5 pounds - not adding salt to his food and has been reading food labels. Reviewed the importance of keeping daily sodium intake to 2000mg  daily  - Return in 2 months for visit and labs.      Relevant Medications   rosuvastatin (CRESTOR) 40 MG tablet   lisinopril (ZESTRIL) 10 MG tablet   Persistent atrial fibrillation (HCC)    Chronic, ongoing.  Continue collaboration with cardiology and current medication regimen, including Metoprolol and Xarelto.  BMP today.      Relevant Medications   rosuvastatin (CRESTOR) 40 MG tablet   lisinopril (ZESTRIL) 10 MG tablet   Atherosclerosis of aorta ([place])    Noted on CT 10/29/17.  Continue Crestor and Xarelto which offer preventative.  Recommend he consistently take his  medication daily, currently using pill packs.      Relevant Medications   rosuvastatin (CRESTOR) 40 MG tablet   lisinopril (ZESTRIL) 10 MG tablet     Respiratory   Obstructive sleep apnea on CPAP    Recommend consistent use of CPAP daily, he reports using every night.        Endocrine   Type 2 diabetes mellitus with proteinuria (HCC) - Primary    Chronic, ongoing with A1C below goal at 6.3% and urine ALB 80 and A:C 30-300.  Continue Lisinopril for kidney protection.  Continue current Jardiance, benefit with his HF.  Check BMP today.  Recommend he check BS at least 3 mornings a week at home.  Return to office in 3 months.  Continue pill packs for consistent medication adherence.      Relevant Medications   rosuvastatin (CRESTOR) 40 MG tablet   lisinopril (ZESTRIL) 10 MG tablet   Other Relevant Orders   Bayer DCA Hb A1c Waived   Hyperlipidemia associated with type 2 diabetes mellitus (HCC)    Chronic, ongoing.  Continue current medication regimen and adjust as needed.  Return in 3 months for visit.  Lipid panel today.      Relevant Medications   rosuvastatin (CRESTOR) 40 MG tablet   lisinopril (ZESTRIL) 10 MG tablet   Other Relevant Orders   Bayer DCA Hb A1c Waived   Lipid Panel w/o Chol/HDL Ratio     Other   Obesity, morbid (more than 100 lbs over ideal weight or BMI > 40) (HCC)    Recommended eating smaller high protein, low fat meals more frequently and exercising 30 mins a day 5 times a week with a goal of 10-15lb weight loss in the next 3 months. Patient voiced their understanding and motivation to adhere to these recommendations.       Anxiety    Chronic, ongoing with some grandiose thought processes present, possible schizophrenia on presentation.  Continue current medication regimen and adjust as needed.  Has history on review old records of missing doses Seroquel, will continue current medication at this time and adjust as needed.  Continue collaboration with CCM SW.   Would benefit from psychiatry and therapy, but he refuses these.  Will continue to encourage this and discuss with him.         Time: 25 minutes, >50% spent counseling and active/empathetic listening   Follow up plan: Return in about 3 months (around 03/11/2020) for T2DM, HTN/HLD, HF, MOOD.

## 2019-12-10 NOTE — Telephone Encounter (Signed)
Refer to note below.

## 2019-12-10 NOTE — Chronic Care Management (AMB) (Signed)
Chronic Care Management    Clinical Social Work Follow Up Note  12/10/2019 Name: Evan [name] MRN: [id number] DOB: 11-Jan-1956  Evan [name] is a 64 y.o. year old male who is a primary care patient of [name], Evan Faster, NP. The CCM team was consulted for assistance with [employer] .   Review of patient status, including review of consultants reports, other relevant assessments, and collaboration with appropriate care team members and the patient's provider was performed as part of comprehensive patient evaluation and provision of chronic care management services.    SDOH (Social Determinants of Health) assessments performed: Yes    Outpatient Encounter Medications as of 12/10/2019  Medication Sig  . acetaminophen (TYLENOL) 500 MG tablet Take 500 mg by mouth every 6 (six) hours as needed.  Marland Kitchen aspirin 81 MG EC tablet Take 1 tablet (81 mg total) by mouth daily.  . cholecalciferol (VITAMIN D3) 25 MCG (1000 UNIT) tablet Take 1 tablet (1,000 Units total) by mouth daily.  . Emollient (EUCERIN) lotion Apply topically as needed for dry skin. Apply to legs and arms daily to help with dry skin.  Marland Kitchen empagliflozin (JARDIANCE) 10 MG TABS tablet Take 10 mg by mouth daily.  . fluticasone (FLONASE) 50 MCG/ACT nasal spray Place 2 sprays into both nostrils daily.   . furosemide (LASIX) 40 MG tablet Take 1 tablet (40 mg total) by mouth 2 (two) times daily. Take 1 tablet (40 mg) by mouth twice a day  . ipratropium-albuterol (DUONEB) 0.5-2.5 (3) MG/3ML SOLN Take 3 mLs by nebulization every 4 (four) hours as needed.  Marland Kitchen lisinopril (ZESTRIL) 10 MG tablet Take 10 mg by mouth daily.  . metoprolol succinate (TOPROL XL) 50 MG 24 hr tablet Take 1 tablet (50 mg total) by mouth daily.  . nitroGLYCERIN (NITROSTAT) 0.4 MG SL tablet Place 1 tablet (0.4 mg total) under the tongue every 5 (five) minutes as needed for chest pain.  Marland Kitchen omeprazole (PRILOSEC) 20 MG capsule Take 1 capsule (20 mg total) by mouth daily.  .  risperiDONE (RISPERDAL) 0.5 MG tablet Take 1 tablet (0.5 mg total) by mouth 2 (two) times daily.  . rivaroxaban (XARELTO) 20 MG TABS tablet Take 1 tablet (20 mg total) by mouth at bedtime.  . rosuvastatin (CRESTOR) 40 MG tablet Take 40 mg by mouth daily.   No facility-administered encounter medications on file as of 12/10/2019.     Goals Addressed    . SW-"I need more help." (pt-stated)       Current Barriers:  . Financial constraints related to managing health care . Limited social support . ADL IADL limitations . Social Isolation . Limited access to caregiver . Inability to perform ADL's independently . Inability to perform IADL's independently . Patient's current residence was put at risk due to hoarding through [place]):  Marland Kitchen Over the next 120 days, patient will work with SW to address concerns related to lack of support within the home . Over the next 120 days, patient will demonstrate improved health management independence as evidenced by implementing appropriate self-care into daily routine    Interventions: . Patient interviewed and appropriate assessments performed-new referral . Provided patient with information about level of care options, personal care service resources and Medicaid benefits.  . Discussed plans with patient for ongoing care management follow up and provided patient with direct contact information for care management team . Assisted patient/caregiver with obtaining information about health plan benefits . A voluntary  and extensive discussion about advanced care planning including explanation and discussion of advanced was undertaken with the patient.  Explanation regarding healthcare proxy and living will was reviewed and packet with forms with explanation of how to fill them out was given.   . Provided education to patient/caregiver regarding level of care options. . Provided education to patient/caregiver  about Hospice and/or Palliative Care services . Patient is new to [facility] and is in need of community resource connection and is unable to provide Medicaid number and reports that DSS informed him that they are in the process of transitioning his Medicaid from [facility] to [employer]. Patient is agreeable to [facility] referral once Medicaid information has been received.  . Emotional support provided due to ongoing stress/anxiety/anger outburst. Solution focused therapy implemented into entire session today. Marland Kitchen LCSW received care coordination call from patient's [place]. She reports that program was unable to even get in the door at his yearly inspection due to hoarding and had to reschedule it. They gave patient a 30 day notice at this point. However, when they came back to complete the inspection, no progress had been made and he failed the inspection and is at risk for losing [employer]. LCSW typed a letter on behalf of patient to advocate for his situation considering his deliberating conditions and sent it to his caseworker. Patient had a hearing on 11/12/19 and they decided to allow patient another chance and did not discharge him from their program. However, patient must continue to work on his hoarding and meet their favorable living conditions requirements. LCSW will update entire team.  . LCSW FILED APS REPORT on 11/11/19 FOR SUSPECTED NEGLECT OF SELF AND INABILITY TO LIVE IN THE COMMUNITY INDEPENDENTLY.  .  LCSW completed call to patient's assigned APS caseworker Synetta Fail at [phone] on 12/08/19 and discussed patient's case. Lolita Patella reports that patient has made progress with getting rid of a lot of his belongings that he was hoarding but admits that his home "still has a long way to go" and "is still a mess." Caseworker has been working with patient since 11/11/19 and reports that she is going to offer patient their treatment services but he will have to be agreeable to this service.  APS caseworker reports that her biggest concern is the fact that patient is not taking his medications as prescribed. LCSW informed her that our clinic Pharmacist is now involved. LCSW will update clinic Pharmacist. Lolita Patella agrees to contact CCM LCSW if any urgent current arise regarding this case.  Marland Kitchen LCSW completed call to patient on 12/10/19 and he reports that his APS caseworker visited him yesterday on 12/09/19 and offered him treatment services but he signed a form declining those services. Patient was provided APS case worker's number in the case that he changes his mind and wishes to gain this support. Patient was adamant about not accepting these services. Patient was provided brief Chief Technology Officer. Patient appreciative of call and education provided.   Patient Self Care Activities:  . Attends all scheduled provider appointments . Lacks social connections  Please see past updates related to this goal by clicking on the "Past Updates" button in the selected goal       Follow Up Plan: SW will follow up with patient by phone over the next quarter  [name], [place], MSW, [email or web address] Phone: [phone]

## 2019-12-10 NOTE — Assessment & Plan Note (Signed)
Chronic, ongoing with some grandiose thought processes present, possible schizophrenia on presentation.  Continue current medication regimen and adjust as needed.  Has history on review old records of missing doses Seroquel, will continue current medication at this time and adjust as needed.  Continue collaboration with CCM SW.  Would benefit from psychiatry and therapy, but he refuses these.  Will continue to encourage this and discuss with him. 

## 2019-12-10 NOTE — Telephone Encounter (Signed)
Copied from [place] [phone]. Topic: General - Other >> Dec 09, 2019  5:27 PM Yvette Rack wrote: Reason for CRM: Pt stated Adult Protective Services just came out and he signed a form thinking the lady was a Education officer, museum but after the fact he was told that he refused services. Pt stated he attempted to stop the lady but she would not stop and she informed him that he refused services. Pt stated he just wanted the incident documented. Pt stated he will come to appt tomorrow.

## 2019-12-10 NOTE — Assessment & Plan Note (Signed)
Recommended eating smaller high protein, low fat meals more frequently and exercising 30 mins a day 5 times a week with a goal of 10-15lb weight loss in the next 3 months. Patient voiced their understanding and motivation to adhere to these recommendations.  

## 2019-12-10 NOTE — Assessment & Plan Note (Signed)
Chronic, ongoing.  Continue current medication regimen and adjust as needed.  Return in 3 months for visit.  Lipid panel today.

## 2019-12-10 NOTE — Assessment & Plan Note (Signed)
Noted on CT 10/29/17.  Continue Crestor and Xarelto which offer preventative.  Recommend he consistently take his medication daily, currently using pill packs.

## 2019-12-10 NOTE — Assessment & Plan Note (Signed)
Chronic, ongoing with A1C below goal at 6.3% and urine ALB 80 and A:C 30-300.  Continue Lisinopril for kidney protection.  Continue current Jardiance, benefit with his HF.  Check BMP today.  Recommend he check BS at least 3 mornings a week at home.  Return to office in 3 months.  Continue pill packs for consistent medication adherence.

## 2019-12-10 NOTE — Assessment & Plan Note (Signed)
Chronic, ongoing.  BP initially elevated, but repeat improved, was initially very agitated and anxious.  Recommend checking these at home a few mornings a week and documenting.  Continue current medication regimen and adjust as needed + collaboration with cardiology. Return in 3 months.

## 2019-12-11 ENCOUNTER — Ambulatory Visit: Payer: Self-pay | Admitting: Licensed Clinical Social Worker

## 2019-12-11 DIAGNOSIS — I4819 Other persistent atrial fibrillation: Secondary | ICD-10-CM | POA: Diagnosis not present

## 2019-12-11 DIAGNOSIS — I10 Essential (primary) hypertension: Secondary | ICD-10-CM | POA: Diagnosis not present

## 2019-12-11 DIAGNOSIS — E1159 Type 2 diabetes mellitus with other circulatory complications: Secondary | ICD-10-CM

## 2019-12-11 DIAGNOSIS — F419 Anxiety disorder, unspecified: Secondary | ICD-10-CM

## 2019-12-11 DIAGNOSIS — I5032 Chronic diastolic (congestive) heart failure: Secondary | ICD-10-CM

## 2019-12-11 DIAGNOSIS — E1129 Type 2 diabetes mellitus with other diabetic kidney complication: Secondary | ICD-10-CM | POA: Diagnosis not present

## 2019-12-11 DIAGNOSIS — R809 Proteinuria, unspecified: Secondary | ICD-10-CM

## 2019-12-11 LAB — BASIC METABOLIC PANEL
BUN/Creatinine Ratio: 16 (ref 10–24)
BUN: 13 mg/dL (ref 8–27)
CO2: 20 mmol/L (ref 20–29)
Calcium: 9.6 mg/dL (ref 8.6–10.2)
Chloride: 108 mmol/L — ABNORMAL HIGH (ref 96–106)
Creatinine, Ser: 0.83 mg/dL (ref 0.76–1.27)
GFR calc Af Amer: 107 mL/min/{1.73_m2} (ref >59)
GFR calc non Af Amer: 92 mL/min/{1.73_m2} (ref >59)
Glucose: 133 mg/dL — ABNORMAL HIGH (ref 65–99)
Potassium: 4.1 mmol/L (ref 3.5–5.2)
Sodium: 143 mmol/L (ref 134–144)

## 2019-12-11 LAB — LIPID PANEL W/O CHOL/HDL RATIO
Cholesterol, Total: 201 mg/dL — ABNORMAL HIGH (ref 100–199)
HDL: 53 mg/dL (ref >39)
LDL Chol Calc (NIH): 129 mg/dL — ABNORMAL HIGH (ref 0–99)
Triglycerides: 106 mg/dL (ref 0–149)
VLDL Cholesterol Cal: 19 mg/dL (ref 5–40)

## 2019-12-11 NOTE — Progress Notes (Signed)
Contacted via [place] afternoon [place], your labs have returned: - Kidney function remains stable - Cholesterol levels are showing some elevation, please ensure you take your Rosuvastatin daily, this should be in your pill pack.  Take your pill packs every day.   Have a great day!! Keep being awesome!! Kindest regards, Pallie Swigert

## 2019-12-11 NOTE — Chronic Care Management (AMB) (Signed)
Chronic Care Management    Clinical Social Work Follow Up Note  12/11/2019 Name: Evan [name] MRN: [id number] DOB: Dec 10, 1955  Evan [name] is a 64 y.o. year old male who is a primary care patient of [name], [name], NP. The CCM team was consulted for assistance with Level of Care Concerns.   Review of patient status, including review of consultants reports, other relevant assessments, and collaboration with appropriate care team members and the patient's provider was performed as part of comprehensive patient evaluation and provision of chronic care management services.    SDOH (Social Determinants of Health) assessments performed: Yes    Outpatient Encounter Medications as of 12/11/2019  Medication Sig   acetaminophen (TYLENOL) 500 MG tablet Take 500 mg by mouth every 6 (six) hours as needed.   aspirin 81 MG EC tablet Take 1 tablet (81 mg total) by mouth daily.   cholecalciferol (VITAMIN D3) 25 MCG (1000 UNIT) tablet Take 1 tablet (1,000 Units total) by mouth daily.   Emollient (EUCERIN) lotion Apply topically as needed for dry skin. Apply to legs and arms daily to help with dry skin.   empagliflozin (JARDIANCE) 10 MG TABS tablet Take 10 mg by mouth daily.   fluticasone (FLONASE) 50 MCG/ACT nasal spray Place 2 sprays into both nostrils daily.    furosemide (LASIX) 40 MG tablet Take 1 tablet (40 mg total) by mouth 2 (two) times daily. Take 1 tablet (40 mg) by mouth twice a day   ipratropium-albuterol (DUONEB) 0.5-2.5 (3) MG/3ML SOLN Take 3 mLs by nebulization every 4 (four) hours as needed.   lisinopril (ZESTRIL) 10 MG tablet Take 10 mg by mouth daily.   metoprolol succinate (TOPROL XL) 50 MG 24 hr tablet Take 1 tablet (50 mg total) by mouth daily.   nitroGLYCERIN (NITROSTAT) 0.4 MG SL tablet Place 1 tablet (0.4 mg total) under the tongue every 5 (five) minutes as needed for chest pain.   omeprazole (PRILOSEC) 20 MG capsule Take 1 capsule (20 mg total) by mouth daily.    risperiDONE (RISPERDAL) 0.5 MG tablet Take 1 tablet (0.5 mg total) by mouth 2 (two) times daily.   rivaroxaban (XARELTO) 20 MG TABS tablet Take 1 tablet (20 mg total) by mouth at bedtime.   rosuvastatin (CRESTOR) 40 MG tablet Take 40 mg by mouth daily.   No facility-administered encounter medications on file as of 12/11/2019.     Goals Addressed     SW-"I need more help." (pt-stated)       Current Barriers:   Financial constraints related to managing health care  Limited social support  ADL IADL limitations  Social Isolation  Limited access to caregiver  Inability to perform ADL's independently  Inability to perform IADL's independently  Patient's current residence was put at risk due to hoarding through [place] Work Clinical Goal(s):   Over the next 120 days, patient will work with SW to address concerns related to lack of support within the home  Over the next 120 days, patient will demonstrate improved health management independence as evidenced by implementing appropriate self-care into daily routine    Interventions:  Patient interviewed and appropriate assessments performed-new referral  Provided patient with information about level of care options, personal care service resources and Medicaid benefits.   Discussed plans with patient for ongoing care management follow up and provided patient with direct contact information for care management team  Assisted patient/caregiver with obtaining information about health plan benefits  A  voluntary and extensive discussion about advanced care planning including explanation and discussion of advanced was undertaken with the patient.  Explanation regarding healthcare proxy and living will was reviewed and packet with forms with explanation of how to fill them out was given.    Provided education to patient/caregiver regarding level of care options.  Provided education to patient/caregiver  about Hospice and/or Palliative Care services  Patient is new to [facility] and is in need of community resource connection and is unable to provide Medicaid number and reports that DSS informed him that they are in the process of transitioning his Medicaid from [facility] to [employer]. Patient is agreeable to [facility] referral once Medicaid information has been received.   Emotional support provided due to ongoing stress/anxiety/anger outburst. Solution focused therapy implemented into entire session today.  LCSW received care coordination call from patient's [place]. She reports that program was unable to even get in the door at his yearly inspection due to hoarding and had to reschedule it. They gave patient a 30 day notice at this point. However, when they came back to complete the inspection, no progress had been made and he failed the inspection and is at risk for losing [employer]. LCSW typed a letter on behalf of patient to advocate for his situation considering his deliberating conditions and sent it to his caseworker. Patient had a hearing on 11/12/19 and they decided to allow patient another chance and did not discharge him from their program. However, patient must continue to work on his hoarding and meet their favorable living conditions requirements. LCSW will update entire team.   LCSW FILED APS REPORT on 11/11/19 FOR SUSPECTED NEGLECT OF SELF AND INABILITY TO LIVE IN THE COMMUNITY INDEPENDENTLY.    LCSW completed call to patient's assigned APS caseworker Evan [name] at [phone] on 12/08/19 and discussed patient's case. Evan [name] reports that patient has made progress with getting rid of a lot of his belongings that he was hoarding but admits that his home "still has a long way to go" and "is still a mess." Caseworker has been working with patient since 11/11/19 and reports that she is going to offer patient their treatment services but he will have to be agreeable to this service.  APS caseworker reports that her biggest concern is the fact that patient is not taking his medications as prescribed. LCSW informed her that our clinic Pharmacist is now involved. LCSW will update clinic Pharmacist. Evan [name] agrees to contact CCM LCSW if any urgent current arise regarding this case.   LCSW completed call to patient on 12/10/19 and he reports that his APS caseworker visited him yesterday on 12/09/19 and offered him treatment services but he signed a form declining those services. Patient was provided APS case worker's number in the case that he changes his mind and wishes to gain this support. Patient was adamant about not accepting these services. Patient was provided brief Chief Technology Officer. Patient appreciative of call and education provided. LCSW spoke to the APS caseworker yesteday. Bridgette Habermann was the one that visited him yesterday because Evan [name] was injured and working from home so this is probably why he was suspicious at first. She informed me that they have closed his case. She said he scored a 30 out of 30 on his competency test and if we wanted to challenge this then we would need it in writing by his doctor. Jerene Pitch offered him their treatment program which is basically an extension (ongoing service)  through APS and would allow them to go in his home a few times a month to check in on him to make sure he is taking his medications but he adamantly refused this saying that he did not need anyone coming to his home to check up on him. He declined to sign their treatment service plan form. They encouraged him to consider [facility] which is opening back up in June but he declined this as well stating his negative experience with [facility]. I called patient on 12/10/19 and asked him to reconsider but he stated that he did not want either service they offered. LCSW called him again on 12/11/19 and he was agreeable after LCSW completed a 3 way call with him and APS caseworker Evan [name]. Patient reports he was confused because the social worker's name was Jerene Pitch just like his CCM Education officer, museum. He also reports that he suffered from a recent loss which caused him increased anxiety which led him to refuse services. Evan [name] is agreeable to contact APS case worker Bridgette Habermann and have her come back out to visit patient and sign the treatment form. Patient is agreeable to this plan. LCSW update team.   Patient Self Care Activities:   Attends all scheduled provider appointments  Lacks social connections  Please see past updates related to this goal by clicking on the "Past Updates" button in the selected goal       Follow Up Plan: SW will follow up with patient by phone over the next quarter  [name], [place], MSW, [email or web address] Phone: [phone]

## 2019-12-11 NOTE — Addendum Note (Signed)
Addended by: [name] on: 12/11/2019 11:07 AM   Modules accepted: Orders

## 2019-12-15 ENCOUNTER — Ambulatory Visit: Payer: Self-pay | Admitting: Licensed Clinical Social Worker

## 2019-12-15 NOTE — Telephone Encounter (Signed)
Patient is calling to ask the status of Adult Protective Service- if they will return and if they are coming back? Patient received a call from social worker but it was [facility]. Patient does not know where where his paperwork is. He does not have a car. And is dependant on others taking him places and does not want to miss the Adult Protective Services. Please advise CB- [phone]

## 2019-12-15 NOTE — Chronic Care Management (AMB) (Signed)
  Care Management   Follow Up Note   12/15/2019 Name: Evan [name] MRN: [id number] DOB: 1956/05/04  Referred by: [name], NP Reason for referral : [place] is a 64 y.o. year old male who is a primary care patient of [name], [name], NP. The care management team was consulted for assistance with care management and care coordination needs.    Review of patient status, including review of consultants reports, relevant laboratory and other test results, and collaboration with appropriate care team members and the patient's provider was performed as part of comprehensive patient evaluation and provision of chronic care management services.    LCSW completed CCM outreach attempt today but was unable to reach patient successfully. A HIPPA compliant voice message was left encouraging patient to return call once available. LCSW rescheduled CCM SW appointment as well.  A HIPPA compliant phone message was left for the patient providing contact information and requesting a return call.   [name], BSW, MSW, [facility] Care Management [email or web address] Phone: [phone]

## 2019-12-17 ENCOUNTER — Ambulatory Visit: Payer: Self-pay | Admitting: Licensed Clinical Social Worker

## 2019-12-17 NOTE — Chronic Care Management (AMB) (Addendum)
  Care Management   Follow Up Note   12/17/2019 Name: Evan [name] MRN: [id number] DOB: 01-17-1956  Referred by: [name], NP Reason for referral : [place] is a 64 y.o. year old male who is a primary care patient of [name], [name], NP. The care management team was consulted for assistance with care management and care coordination needs.    Review of patient status, including review of consultants reports, relevant laboratory and other test results, and collaboration with appropriate care team members and the patient's provider was performed as part of comprehensive patient evaluation and provision of chronic care management services.    LCSW received incoming call from patient today on 12/17/19. Patient was asking for his current APS caseworker's number because he lost the piece of paper that he had it wrote down on. LCSW provided patient with both of APS caseworker, Lolita Patella Jones's numbers. Patient was appreciative of this information and is agreeable to contact her today to follow upon their treatment services as he is agreeable to these services now.   The care management team will reach out to the patient again over the next 45 days.   [name], BSW, MSW, [facility] Care Management [email or web address] Phone: [phone]

## 2019-12-19 ENCOUNTER — Ambulatory Visit: Payer: Self-pay | Admitting: Licensed Clinical Social Worker

## 2019-12-19 NOTE — Chronic Care Management (AMB) (Signed)
  Care Management   Follow Up Note   12/19/2019 Name: Evan [name] MRN: [id number] DOB: Dec 02, 1955  Referred by: [name], NP Reason for referral : [place] is a 64 y.o. year old male who is a primary care patient of [name], [name], NP. The care management team was consulted for assistance with care management and care coordination needs.    Review of patient status, including review of consultants reports, relevant laboratory and other test results, and collaboration with appropriate care team members and the patient's provider was performed as part of comprehensive patient evaluation and provision of chronic care management services.    A HIPPA compliant phone message was left for APS caseworker Synetta Fail on 12/19/19 on both her personal and work number asking for her to return call back to self and patient in regards to his treatment services that is now agreeable to.  [name], BSW, MSW, [facility] Care Management [email or web address] Phone: [phone]

## 2019-12-24 ENCOUNTER — Telehealth: Payer: Self-pay

## 2019-12-24 NOTE — Telephone Encounter (Signed)
Copied from [place] [phone]. Topic: Referral - Status >> Dec 24, 2019 0000000 PM Simone Curia D wrote: [id number] Attempted to call patient at [phone] unable to leave message voicemail not set-up.  Left message on voicemail for manager at [facility] to get name, address and nunber for landlord.   12/24/19 Spoke with patient's caseworker [name] at [place] she is currently working on patient's case only needs copy of rent receipt of payment or landlords name and address. Ambrose Mantle [phone]

## 2019-12-26 ENCOUNTER — Telehealth: Payer: Self-pay

## 2019-12-26 NOTE — Telephone Encounter (Signed)
Copied from [place] [phone]. Topic: Referral - Status >> Dec 26, 2019 123XX123 PM Simone Curia D wrote: [id number] Spoke with patient about the information needed to complete his paperwork with DSS.  He provided the address, name and phone number of where he sends his rent payment.  Emailed this information to his caseworker [name] at [email or web address] Called Janett Billow to confirm she had received the email.  She will call [employer]. Ambrose Mantle [phone]

## 2019-12-30 ENCOUNTER — Telehealth: Payer: Self-pay

## 2019-12-30 NOTE — Telephone Encounter (Signed)
Copied from [place] [phone]. Topic: Referral - Status >> Dec 30, 2019 123XX123 AM Simone Curia D wrote: [id number] Spoke with patient about food stamp re-submission to let him know that his caseworker [name] at [place] recieved the email I sent with [employer] address and phone number.  No other resources needed at this time closing referral. Evan [name] [phone]

## 2020-01-07 ENCOUNTER — Ambulatory Visit (INDEPENDENT_AMBULATORY_CARE_PROVIDER_SITE_OTHER): Payer: Medicare Other | Admitting: Pharmacist

## 2020-01-07 ENCOUNTER — Encounter: Payer: Self-pay | Admitting: Pharmacist

## 2020-01-07 DIAGNOSIS — E1159 Type 2 diabetes mellitus with other circulatory complications: Secondary | ICD-10-CM

## 2020-01-07 DIAGNOSIS — R809 Proteinuria, unspecified: Secondary | ICD-10-CM

## 2020-01-07 DIAGNOSIS — K219 Gastro-esophageal reflux disease without esophagitis: Secondary | ICD-10-CM

## 2020-01-07 NOTE — Patient Instructions (Signed)
Visit Information  Goals Addressed            This Visit's Progress     Patient Stated   . PharmD "I am confused by my medications" (pt-stated)       Current Barriers:  . Polypharmacy; complex patient with multiple comorbidities including ASCVD (hx CABG, MI); Afib, CHF, T2DM, osteoporosis, hx prostate cancer (s/p Lupron therapy, d/c d/t behavioral disturbances) . Patient notes that sometimes, he will take his morning medications and then throw up. At first attributes to a medication, then mentions mucus, then attributes to acid reflux. Also expresses concerns about omeprazole, as he read that it should only be used for a short course. . Current medications as prescribed: o Afib/HTN/HFpEF: Xarelto 20 mg QPM, furosemide 40 mg BID, lisinopril 10 mg QAM, metoprolol succinate 50 mg QAM,  o ASCVD risk reduction: ASA 81 mg daily, rosuvastatin 40 mg daily; per records, last stent 2002 o T2DM: Jardiance 10 mg daily; A1c at goal <7% o Osteoporosis: Last DEXA showed t score -3.1% at femoral neck.; Vitamin D 1000 units daily o Anxiety, ?schizophrenia: risperdone 0.5 mg BID  Pharmacist Clinical Goal(s):  Marland Kitchen Over the next 90 days, patient will work with PharmD and provider towards optimized medication management  Interventions: . Comprehensive medication review performed, medication list updated in electronic medical record . Inter-disciplinary care team collaboration (see longitudinal plan of care) . Reviewed that optimal PPI dosing is 30 minutes before a meal. Discussed a plan of taking out the omeprazole capsule (blue/purple), taking on an empty stomach at least 30 minutes before a meal, then eating breakfast, then taking his other morning medications. Patient agrees to this to see if helps reduce acid/reduce episodes of vomiting . Discussed that we generally use PPI short term for acid reflux, but if issue is not resolved with short course, continued therapy is appropriate. He verbalizes  understanding. Marland Kitchen Contacted [facility] to see if they could package the omeprazole separately moving forward. They cannot do this based on constraints of packaging and still be able to offer to the patient for free.   Patient Self Care Activities:  . Patient will take medications as prescribed  Please see past updates related to this goal by clicking on the "Past Updates" button in the selected goal         Patient verbalizes understanding of instructions provided today.   Plan:  - Scheduled f/u call in ~12 weeks  Catie Darnelle Maffucci, PharmD, [place] [phone]

## 2020-01-07 NOTE — Chronic Care Management (AMB) (Signed)
Chronic Care Management   Follow Up Note   01/07/2020 Name: Evan [name] MRN: [id number] DOB: July 30, 1956  Referred by: [name], NP Reason for referral : Chronic Care Management (Medication Management)   Evan [name] is a 64 y.o. year old male who is a primary care patient of [name], [name], NP. The CCM team was consulted for assistance with chronic disease management and care coordination needs.    Contacted patient for medication management review.   Review of patient status, including review of consultants reports, relevant laboratory and other test results, and collaboration with appropriate care team members and the patient's provider was performed as part of comprehensive patient evaluation and provision of chronic care management services.    SDOH (Social Determinants of Health) assessments performed: Yes See Care Plan activities for detailed interventions related to [facility])     Outpatient Encounter Medications as of 01/07/2020  Medication Sig   aspirin 81 MG EC tablet Take 1 tablet (81 mg total) by mouth daily.   cholecalciferol (VITAMIN D3) 25 MCG (1000 UNIT) tablet Take 1 tablet (1,000 Units total) by mouth daily.   empagliflozin (JARDIANCE) 10 MG TABS tablet Take 10 mg by mouth daily.   furosemide (LASIX) 40 MG tablet Take 1 tablet (40 mg total) by mouth 2 (two) times daily. Take 1 tablet (40 mg) by mouth twice a day   lisinopril (ZESTRIL) 10 MG tablet Take 10 mg by mouth daily.   metoprolol succinate (TOPROL XL) 50 MG 24 hr tablet Take 1 tablet (50 mg total) by mouth daily.   omeprazole (PRILOSEC) 20 MG capsule Take 1 capsule (20 mg total) by mouth daily.   risperiDONE (RISPERDAL) 0.5 MG tablet Take 1 tablet (0.5 mg total) by mouth 2 (two) times daily.   rivaroxaban (XARELTO) 20 MG TABS tablet Take 1 tablet (20 mg total) by mouth at bedtime.   rosuvastatin (CRESTOR) 40 MG tablet Take 40 mg by mouth daily.   acetaminophen (TYLENOL) 500 MG tablet  Take 500 mg by mouth every 6 (six) hours as needed.   Emollient (EUCERIN) lotion Apply topically as needed for dry skin. Apply to legs and arms daily to help with dry skin.   fluticasone (FLONASE) 50 MCG/ACT nasal spray Place 2 sprays into both nostrils daily.    ipratropium-albuterol (DUONEB) 0.5-2.5 (3) MG/3ML SOLN Take 3 mLs by nebulization every 4 (four) hours as needed.   nitroGLYCERIN (NITROSTAT) 0.4 MG SL tablet Place 1 tablet (0.4 mg total) under the tongue every 5 (five) minutes as needed for chest pain.   No facility-administered encounter medications on file as of 01/07/2020.     Objective:   Goals Addressed            This Visit's Progress     Patient Stated    PharmD "I am confused by my medications" (pt-stated)       Current Barriers:   Polypharmacy; complex patient with multiple comorbidities including ASCVD (hx CABG, MI); Afib, CHF, T2DM, osteoporosis, hx prostate cancer (s/p Lupron therapy, d/c d/t behavioral disturbances)  Patient notes that sometimes, he will take his morning medications and then throw up. At first attributes to a medication, then mentions mucus, then attributes to acid reflux. Also expresses concerns about omeprazole, as he read that it should only be used for a short course.  Current medications as prescribed: o Afib/HTN/HFpEF: Xarelto 20 mg QPM, furosemide 40 mg BID, lisinopril 10 mg QAM, metoprolol succinate 50 mg QAM,  o ASCVD risk  reduction: ASA 81 mg daily, rosuvastatin 40 mg daily; per records, last stent 2002 o T2DM: Jardiance 10 mg daily; A1c at goal <7% o Osteoporosis: Last DEXA showed t score -3.1% at femoral neck.; Vitamin D 1000 units daily o Anxiety, ?schizophrenia: risperdone 0.5 mg BID  Pharmacist Clinical Goal(s):   Over the next 90 days, patient will work with PharmD and provider towards optimized medication management  Interventions:  Comprehensive medication review performed, medication list updated in electronic  medical record  Inter-disciplinary care team collaboration (see longitudinal plan of care)  Reviewed that optimal PPI dosing is 30 minutes before a meal. Discussed a plan of taking out the omeprazole capsule (blue/purple), taking on an empty stomach at least 30 minutes before a meal, then eating breakfast, then taking his other morning medications. Patient agrees to this to see if helps reduce acid/reduce episodes of vomiting  Discussed that we generally use PPI short term for acid reflux, but if issue is not resolved with short course, continued therapy is appropriate. He verbalizes understanding.  Contacted [facility] to see if they could package the omeprazole separately moving forward. They cannot do this based on constraints of packaging and still be able to offer to the patient for free.   Patient Self Care Activities:   Patient will take medications as prescribed  Please see past updates related to this goal by clicking on the "Past Updates" button in the selected goal          Plan:  - Scheduled f/u call in ~12 weeks  Catie Darnelle Maffucci, PharmD, [place] [phone]

## 2020-01-12 ENCOUNTER — Telehealth: Payer: Self-pay | Admitting: Nurse Practitioner

## 2020-01-12 ENCOUNTER — Ambulatory Visit: Payer: Self-pay | Admitting: Licensed Clinical Social Worker

## 2020-01-12 DIAGNOSIS — R809 Proteinuria, unspecified: Secondary | ICD-10-CM

## 2020-01-12 DIAGNOSIS — I4819 Other persistent atrial fibrillation: Secondary | ICD-10-CM

## 2020-01-12 DIAGNOSIS — E1169 Type 2 diabetes mellitus with other specified complication: Secondary | ICD-10-CM

## 2020-01-12 DIAGNOSIS — I10 Essential (primary) hypertension: Secondary | ICD-10-CM

## 2020-01-12 DIAGNOSIS — I25118 Atherosclerotic heart disease of native coronary artery with other forms of angina pectoris: Secondary | ICD-10-CM

## 2020-01-12 DIAGNOSIS — I5032 Chronic diastolic (congestive) heart failure: Secondary | ICD-10-CM

## 2020-01-12 DIAGNOSIS — E785 Hyperlipidemia, unspecified: Secondary | ICD-10-CM

## 2020-01-12 DIAGNOSIS — E1159 Type 2 diabetes mellitus with other circulatory complications: Secondary | ICD-10-CM

## 2020-01-12 DIAGNOSIS — I152 Hypertension secondary to endocrine disorders: Secondary | ICD-10-CM

## 2020-01-12 DIAGNOSIS — F423 Hoarding disorder: Secondary | ICD-10-CM

## 2020-01-12 DIAGNOSIS — E1129 Type 2 diabetes mellitus with other diabetic kidney complication: Secondary | ICD-10-CM

## 2020-01-12 DIAGNOSIS — F419 Anxiety disorder, unspecified: Secondary | ICD-10-CM
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
## 2020-01-12 NOTE — Telephone Encounter (Signed)
Evan Pitch do you mind reaching out to him today on this.  Thank you.

## 2020-01-20 ENCOUNTER — Telehealth: Payer: Self-pay

## 2020-01-28 ENCOUNTER — Ambulatory Visit: Payer: Self-pay | Admitting: Licensed Clinical Social Worker

## 2020-01-28 NOTE — Chronic Care Management (AMB) (Signed)
  Care Management   Follow Up Note   01/28/2020 Name: NIKOLAJ GERAGHTY MRN: [id number] DOB: 09-Apr-1956  Referred by: [name], NP Reason for referral : [place] is a 64 y.o. year old male who is a primary care patient of [name], [name], NP. The care management team was consulted for assistance with care management and care coordination needs.    Review of patient status, including review of consultants reports, relevant laboratory and other test results, and collaboration with appropriate care team members and the patient's provider was performed as part of comprehensive patient evaluation and provision of chronic care management services.    LCSW completed CCM outreach attempt today but was unable to reach patient successfully. A HIPPA compliant voice message was left encouraging patient to return call once available. LCSW rescheduled CCM SW appointment as well.  A HIPPA compliant phone message was left for the patient providing contact information and requesting a return call.   [name], BSW, MSW, [facility] Care Management [email or web address] Phone: [phone]

## 2020-02-04 ENCOUNTER — Other Ambulatory Visit: Payer: Self-pay

## 2020-02-04 ENCOUNTER — Inpatient Hospital Stay: Payer: Medicare Other | Attending: Radiation Oncology

## 2020-02-04 DIAGNOSIS — C61 Malignant neoplasm of prostate: Secondary | ICD-10-CM

## 2020-02-04 LAB — PSA: Prostatic Specific Antigen: 0.05 ng/mL (ref 0.00–4.00)

## 2020-02-05 ENCOUNTER — Telehealth: Payer: Self-pay | Admitting: Nurse Practitioner
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
## 2020-02-06 ENCOUNTER — Ambulatory Visit (INDEPENDENT_AMBULATORY_CARE_PROVIDER_SITE_OTHER): Payer: Medicare Other | Admitting: General Practice

## 2020-02-06 ENCOUNTER — Telehealth: Payer: Self-pay | Admitting: General Practice

## 2020-02-06 DIAGNOSIS — I10 Essential (primary) hypertension: Secondary | ICD-10-CM

## 2020-02-06 DIAGNOSIS — I5032 Chronic diastolic (congestive) heart failure: Secondary | ICD-10-CM | POA: Diagnosis not present

## 2020-02-06 DIAGNOSIS — E1169 Type 2 diabetes mellitus with other specified complication: Secondary | ICD-10-CM

## 2020-02-06 DIAGNOSIS — I4819 Other persistent atrial fibrillation: Secondary | ICD-10-CM

## 2020-02-06 DIAGNOSIS — E785 Hyperlipidemia, unspecified: Secondary | ICD-10-CM | POA: Diagnosis not present

## 2020-02-06 DIAGNOSIS — E1159 Type 2 diabetes mellitus with other circulatory complications: Secondary | ICD-10-CM

## 2020-02-06 DIAGNOSIS — R809 Proteinuria, unspecified: Secondary | ICD-10-CM

## 2020-02-06 DIAGNOSIS — E1129 Type 2 diabetes mellitus with other diabetic kidney complication: Secondary | ICD-10-CM

## 2020-02-06 DIAGNOSIS — I25118 Atherosclerotic heart disease of native coronary artery with other forms of angina pectoris: Secondary | ICD-10-CM

## 2020-02-06 DIAGNOSIS — F419 Anxiety disorder, unspecified: Secondary | ICD-10-CM

## 2020-02-06 NOTE — Patient Instructions (Signed)
Visit Information  Goals Addressed            This Visit's Progress   . RNCM: I have a lot of health problems but I manage okay       CARE PLAN ENTRY (see longtitudinal plan of care for additional care plan information)  Current Barriers:  . Chronic Disease Management support, education, and care coordination needs related to Atrial Fibrillation, CHF, CAD, HTN, HLD, DMII, Anxiety, and Depression  Clinical Goal(s) related to Atrial Fibrillation, CHF, CAD, HTN, HLD, DMII, Anxiety, and Depression:  Over the next 120 days, patient will:  . Work with the care management team to address educational, disease management, and care coordination needs  . Begin or continue self health monitoring activities as directed today  Measure and record blood pressure 3 times per week, and Measure and record weight daily, DMII under control at this time as evidence by Hemoglobin A1C at 6.6 and last fasting blood sugar on labs of 113 . Call provider office for new or worsened signs and symptoms related to Hypoglycemia or Hyperglycemia, Blood pressure findings outside established parameters, Weight outside established parameters, and New or worsened symptom related to Chronic Health Conditions . Call care management team with questions or concerns . Verbalize basic understanding of patient centered plan of care established today  Interventions related to Atrial Fibrillation, CHF, CAD, HTN, HLD, DMII, Anxiety, and Depression:  . Evaluation of current treatment plans and patient's adherence to plan as established by provider . Assessed patient understanding of disease states . Assessed patient's education and care coordination needs- discussed taking blood pressures (patient does not have a cuff- discussed the OTC product book through [facility] and the monitoring program through [facility]).  02-06-2020: The patient is still not checking his blood pressure at home. States when he goes to the doctor it is good. The patient denies  any issues with blood pressure at this time. Will continue to monitor.  . Assessed the patient's need for blood glucose testing on a daily basis.  The patient's most recent hemoglobin A1C is 6.6 and fasting blood sugar was 113.  Will continue to monitor for changes . Education to the patient about calling [facility] Customer service line to ask about Hardin Negus life alert system or comparable system since he lives alone   . Provided disease specific education to patient- education on the benefits of daily monitoring of VS and daily weight checks to maintain health and well being.  02-06-2020: The patient still does not weigh daily or check VS. States that he has lost weight and now weighs 236.  He had to take out a loan so he could buy new clothes.  . Education on DASH diet and ADA diet- written information provided through the myChart function and EMMI.  02-06-2020: The patient endorses eating well and watching his sodium content. The patient states that he has a Medical sales representative and had it stocked well with food. Denies any needs for food resources at this time.  . Evaluation: The patient states that he has been evicted and he needs help with transportation so he can sign an appeal. C3 guide referral for help with transportation, impending eviction.  The patient may have to go to a shelter to stay. Discussed with the LCSW.  The LCSW has had 2 failed attempts at reaching the patient today. C3 care guide referral has been placed. 02-06-2020: The patient is very upset that the APS SW came unannounced yesterday and would not talk to him just  kept going through his house. Explained that this was a process to keep him in his home and they could come at any time unannounced. The patient wanted this to "STOP". The patient states that this is causing him a lot of anxiety and "embarrassment". Education and support given. Explained to the patient he would have to talk to APS and the SW that the [facility] could not answer these questions for him. The  RNCM finally was able to get the patient to understand that APS was not associated with [facility].  The patient kept circling back to the events of 02-05-2020 and repeating him being upset that the APS SW came to his home unannounced.   Boundaries and redirection with the patient set. Explained the role of the RNCM in his health and well being. The patient verbalized understanding. Will continue to work with the patient on his healthcare needs.  . Evaluation of the patients multiple medical complaints as expressed to a nurse over the weekend. The patient denies any nausea, vomiting, or other health concerns. His biggest concern today is the need to sign an appeal so he can stay in his house. The RNCM has reviewed for changes in his medical condition or worsening signs and symptoms of chronic conditions to be evaluated in the ER.  The patient verbalized understanding. 02-06-2020: The patient denies any health concerns related to his chronic conditions of AF/CHF/CAD, HTN, HLD and DMII.  The patient states that he has an appointment with the cancer MD on 02-12-2020 and will arrange transportation to the appointment. States he can do this.  The patient has a new ipad and is trying to keep track of his health, appointments and other things. The patient endorses taking medications as directed.  Nash Dimmer with appropriate clinical care team members regarding patient needs- patient is already involved with LCSW and Pharmacist, will continue to monitor for changes in condition and support. 02-06-2020: Supplied the numbers to the CCM team to the patient as he had an issue with his telephone and had to get a new one. It deleted his phone numbers.  The patient has contact information for the CCM team now.  . Evaluation of upcoming appointments. Sees oncologist on 02-12-2020 and next appointment with pcp on 03/24/2020.   Patient Self Care Activities related to Atrial Fibrillation, CHF, CAD, HTN, HLD, DMII, Anxiety, and Depression:   . Patient is unable to independently self-manage chronic health conditions  Please see past updates related to this goal by clicking on the "Past Updates" button in the selected goal         Patient verbalizes understanding of instructions provided today.   The care management team will reach out to the patient again over the next 60 to 90 days.   Noreene Larsson RN, MSN, [facility] Mobile: [phone]

## 2020-02-06 NOTE — Chronic Care Management (AMB) (Signed)
Chronic Care Management   Follow Up Note   02/06/2020 Name: Evan [name] MRN: [id number] DOB: Aug 08, 1955  Referred by: Evan Lick, NP Reason for referral : Chronic Care Management (RNCM Follow up: Chronic Disease Management and Care Coordination Needs)   Evan [name] is a 64 y.o. year old male who is a primary care patient of [name], [name], NP. The CCM team was consulted for assistance with chronic disease management and care coordination needs.    Review of patient status, including review of consultants reports, relevant laboratory and other test results, and collaboration with appropriate care team members and the patient's provider was performed as part of comprehensive patient evaluation and provision of chronic care management services.    SDOH (Social Determinants of Health) assessments performed: Yes See Care Plan activities for detailed interventions related to [facility])     Outpatient Encounter Medications as of 02/06/2020  Medication Sig  . acetaminophen (TYLENOL) 500 MG tablet Take 500 mg by mouth every 6 (six) hours as needed.  Marland Kitchen aspirin 81 MG EC tablet Take 1 tablet (81 mg total) by mouth daily.  . cholecalciferol (VITAMIN D3) 25 MCG (1000 UNIT) tablet Take 1 tablet (1,000 Units total) by mouth daily.  . Emollient (EUCERIN) lotion Apply topically as needed for dry skin. Apply to legs and arms daily to help with dry skin.  Marland Kitchen empagliflozin (JARDIANCE) 10 MG TABS tablet Take 10 mg by mouth daily.  . fluticasone (FLONASE) 50 MCG/ACT nasal spray Place 2 sprays into both nostrils daily.   . furosemide (LASIX) 40 MG tablet Take 1 tablet (40 mg total) by mouth 2 (two) times daily. Take 1 tablet (40 mg) by mouth twice a day  . ipratropium-albuterol (DUONEB) 0.5-2.5 (3) MG/3ML SOLN Take 3 mLs by nebulization every 4 (four) hours as needed.  Marland Kitchen lisinopril (ZESTRIL) 10 MG tablet Take 10 mg by mouth daily.  . metoprolol succinate (TOPROL XL) 50 MG 24 hr tablet Take 1 tablet  (50 mg total) by mouth daily.  . nitroGLYCERIN (NITROSTAT) 0.4 MG SL tablet Place 1 tablet (0.4 mg total) under the tongue every 5 (five) minutes as needed for chest pain.  Marland Kitchen omeprazole (PRILOSEC) 20 MG capsule Take 1 capsule (20 mg total) by mouth daily.  . risperiDONE (RISPERDAL) 0.5 MG tablet Take 1 tablet (0.5 mg total) by mouth 2 (two) times daily.  . rivaroxaban (XARELTO) 20 MG TABS tablet Take 1 tablet (20 mg total) by mouth at bedtime.  . rosuvastatin (CRESTOR) 40 MG tablet Take 40 mg by mouth daily.   No facility-administered encounter medications on file as of 02/06/2020.     Objective:  BP Readings from Last 3 Encounters:  12/10/19 128/86  11/05/19 134/72  10/27/19 (!) 141/89    Goals Addressed            This Visit's Progress   . RNCM: I have a lot of health problems but I manage okay       CARE PLAN ENTRY (see longtitudinal plan of care for additional care plan information)  Current Barriers:  . Chronic Disease Management support, education, and care coordination needs related to Atrial Fibrillation, CHF, CAD, HTN, HLD, DMII, Anxiety, and Depression  Clinical Goal(s) related to Atrial Fibrillation, CHF, CAD, HTN, HLD, DMII, Anxiety, and Depression:  Over the next 120 days, patient will:  . Work with the care management team to address educational, disease management, and care coordination needs  . Begin or continue self health monitoring activities  as directed today  Measure and record blood pressure 3 times per week, and Measure and record weight daily, DMII under control at this time as evidence by Hemoglobin A1C at 6.6 and last fasting blood sugar on labs of 113 . Call provider office for new or worsened signs and symptoms related to Hypoglycemia or Hyperglycemia, Blood pressure findings outside established parameters, Weight outside established parameters, and New or worsened symptom related to Chronic Health Conditions . Call care management team with questions or  concerns . Verbalize basic understanding of patient centered plan of care established today  Interventions related to Atrial Fibrillation, CHF, CAD, HTN, HLD, DMII, Anxiety, and Depression:  . Evaluation of current treatment plans and patient's adherence to plan as established by provider . Assessed patient understanding of disease states . Assessed patient's education and care coordination needs- discussed taking blood pressures (patient does not have a cuff- discussed the OTC product book through [facility] and the monitoring program through [facility]).  02-06-2020: The patient is still not checking his blood pressure at home. States when he goes to the doctor it is good. The patient denies any issues with blood pressure at this time. Will continue to monitor.  . Assessed the patient's need for blood glucose testing on a daily basis.  The patient's most recent hemoglobin A1C is 6.6 and fasting blood sugar was 113.  Will continue to monitor for changes . Education to the patient about calling [facility] Customer service line to ask about Hardin Negus life alert system or comparable system since he lives alone   . Provided disease specific education to patient- education on the benefits of daily monitoring of VS and daily weight checks to maintain health and well being.  02-06-2020: The patient still does not weigh daily or check VS. States that he has lost weight and now weighs 236.  He had to take out a loan so he could buy new clothes.  . Education on DASH diet and ADA diet- written information provided through the myChart function and EMMI.  02-06-2020: The patient endorses eating well and watching his sodium content. The patient states that he has a Medical sales representative and had it stocked well with food. Denies any needs for food resources at this time.  . Evaluation: The patient states that he has been evicted and he needs help with transportation so he can sign an appeal. C3 guide referral for help with transportation, impending eviction.   The patient may have to go to a shelter to stay. Discussed with the LCSW.  The LCSW has had 2 failed attempts at reaching the patient today. C3 care guide referral has been placed. 02-06-2020: The patient is very upset that the APS SW came unannounced yesterday and would not talk to him just kept going through his house. Explained that this was a process to keep him in his home and they could come at any time unannounced. The patient wanted this to "STOP". The patient states that this is causing him a lot of anxiety and "embarrassment". Education and support given. Explained to the patient he would have to talk to APS and the SW that the [facility] could not answer these questions for him. The RNCM finally was able to get the patient to understand that APS was not associated with [facility].  The patient kept circling back to the events of 02-05-2020 and repeating him being upset that the APS SW came to his home unannounced.   Boundaries and redirection with the patient set. Explained the  role of the RNCM in his health and well being. The patient verbalized understanding. Will continue to work with the patient on his healthcare needs.  . Evaluation of the patients multiple medical complaints as expressed to a nurse over the weekend. The patient denies any nausea, vomiting, or other health concerns. His biggest concern today is the need to sign an appeal so he can stay in his house. The RNCM has reviewed for changes in his medical condition or worsening signs and symptoms of chronic conditions to be evaluated in the ER.  The patient verbalized understanding. 02-06-2020: The patient denies any health concerns related to his chronic conditions of AF/CHF/CAD, HTN, HLD and DMII.  The patient states that he has an appointment with the cancer MD on 02-12-2020 and will arrange transportation to the appointment. States he can do this.  The patient has a new ipad and is trying to keep track of his health, appointments and other things. The  patient endorses taking medications as directed.  Nash Dimmer with appropriate clinical care team members regarding patient needs- patient is already involved with LCSW and Pharmacist, will continue to monitor for changes in condition and support. 02-06-2020: Supplied the numbers to the CCM team to the patient as he had an issue with his telephone and had to get a new one. It deleted his phone numbers.  The patient has contact information for the CCM team now.  . Evaluation of upcoming appointments. Sees oncologist on 02-12-2020 and next appointment with pcp on 03/24/2020.   Patient Self Care Activities related to Atrial Fibrillation, CHF, CAD, HTN, HLD, DMII, Anxiety, and Depression:  . Patient is unable to independently self-manage chronic health conditions  Please see past updates related to this goal by clicking on the "Past Updates" button in the selected goal          Plan:   The care management team will reach out to the patient again over the next 60 to 90 days.    Noreene Larsson RN, MSN, [facility] Mobile: [phone]

## 2020-02-11 ENCOUNTER — Ambulatory Visit: Payer: Medicare (Managed Care) | Admitting: Radiation Oncology

## 2020-02-12 ENCOUNTER — Encounter: Payer: Self-pay | Admitting: Radiation Oncology

## 2020-02-12 ENCOUNTER — Other Ambulatory Visit: Payer: Self-pay

## 2020-02-12 ENCOUNTER — Ambulatory Visit
Admission: RE | Admit: 2020-02-12 | Discharge: 2020-02-12 | Disposition: A | Discharge status: Home / Self Care | Payer: Medicare Other | Source: Ambulatory Visit | Attending: Radiation Oncology | Admitting: Radiation Oncology

## 2020-02-12 VITALS — BP 147/82 | HR 93 | Temp 97.8°F | Wt 230.0 lb

## 2020-02-12 DIAGNOSIS — C61 Malignant neoplasm of prostate: Secondary | ICD-10-CM

## 2020-02-12 NOTE — Progress Notes (Signed)
Radiation Oncology Follow up Note  Name: Evan [name]   Date:   02/12/2020 MRN:  [id number] DOB: [birth year]    This 64 y.o. male presents to the clinic today for 72-month follow-up status post IMRT radiation therapy to his prostate and pelvic nodes for Gleason 8 (4+4) adenocarcinoma prostate presenting with a PSA of 30.Marland Kitchen  REFERRING PROVIDER: [facility]*  HPI: Patient is a 64 year old male now out 2 years having completed IMRT radiation therapy to his prostate and pelvic nodes for stage IIb Gleason 8 (4+4) adenocarcinoma the prostate presenting with a PSA of 30 seen today in routine follow-up he is doing well he specifically denies any increased lower urinary tract symptoms diarrhea or fatigue.  His most recent PSA is 0.05.Marland Kitchen  He is being treated for congestive heart failure as well as hypertension associated with diabetes.  Patient also has persistent atrial fibrillation.  COMPLICATIONS OF TREATMENT: none  FOLLOW UP COMPLIANCE: keeps appointments   PHYSICAL EXAM:  BP (!) 147/82 (BP Location: Right Arm, Patient Position: Sitting, Cuff Size: Large)   Pulse 93   Temp 97.8 F (36.6 C) (Tympanic)   Wt 230 lb (104.3 kg)   SpO2 99% Comment: room air  BMI 38.27 kg/m  Well-developed well-nourished patient in NAD. HEENT reveals PERLA, EOMI, discs not visualized.  Oral cavity is clear. No oral mucosal lesions are identified. Neck is clear without evidence of cervical or supraclavicular adenopathy. Lungs are clear to A&P. Cardiac examination is essentially unremarkable with regular rate and rhythm without murmur rub or thrill. Abdomen is benign with no organomegaly or masses noted. Motor sensory and DTR levels are equal and symmetric in the upper and lower extremities. Cranial nerves II through XII are grossly intact. Proprioception is intact. No peripheral adenopathy or edema is identified. No motor or sensory levels are noted. Crude visual fields are within normal range.  RADIOLOGY  RESULTS: No current films to review  PLAN: Present time from a prostate standpoint he is under excellent biochemical control of his prostate cancer.  I am pleased with his overall progress.  Of asked to see him back in 1 year for follow-up with a PSA at that time.  He continues treatment for his other medical comorbidities as listed above.  Patient knows to call with any concerns at any time.  I would like to take this opportunity to thank you for allowing me to participate in the care of your patient.Noreene Filbert, MD

## 2020-02-16 ENCOUNTER — Ambulatory Visit (INDEPENDENT_AMBULATORY_CARE_PROVIDER_SITE_OTHER): Payer: Medicare Other

## 2020-02-16 ENCOUNTER — Telehealth: Payer: Self-pay

## 2020-02-16 VITALS — Ht 65.0 in | Wt 233.0 lb

## 2020-02-16 DIAGNOSIS — Z0000 Encounter for general adult medical examination without abnormal findings: Secondary | ICD-10-CM | POA: Diagnosis not present

## 2020-02-16 NOTE — Progress Notes (Signed)
I connected with Evan [name] today by telephone and verified that I am speaking with the correct person using two identifiers. Location patient: home Location provider: work Persons participating in the virtual visit: Evan [name], Evan [name].   I discussed the limitations, risks, security and privacy concerns of performing an evaluation and management service by telephone and the availability of in person appointments. I also discussed with the patient that there may be a patient responsible charge related to this service. The patient expressed understanding and verbally consented to this telephonic visit.    Interactive audio and video telecommunications were attempted between this provider and patient, however failed, due to patient having technical difficulties OR patient did not have access to video capability.  We continued and completed visit with audio only.  Vital signs maybe patient reported or missing.     Subjective:   Evan [name] is a 64 y.o. male who presents for Medicare Annual/Subsequent preventive examination.  Review of Systems     Cardiac Risk Factors include: advanced age (>59men, >21 women);diabetes mellitus;dyslipidemia;hypertension;male gender;obesity (BMI >30kg/m2)     Objective:    Today's Vitals   02/16/20 1114  Weight: 233 lb (105.7 kg)  Height: 5\' 5"  (1.651 m)   Body mass index is 38.77 kg/m.  Advanced Directives 02/16/2020 10/27/2019 10/13/2019 02/05/2019 12/20/2018 08/08/2018 06/03/2018  Does Patient Have a Medical Advance Directive? Yes No No No No No No  Type of Paramedic of [place];Living will - - - - - -  Does patient want to make changes to medical advance directive? - - - - - - -  Copy of [place] in Chart? No - copy requested - - - - - -  Would patient like information on creating a medical advance directive? - - - No - Patient declined No - Patient declined - No - Patient declined     Current Medications (verified) Outpatient Encounter Medications as of 02/16/2020  Medication Sig  . acetaminophen (TYLENOL) 500 MG tablet Take 500 mg by mouth every 6 (six) hours as needed.  Marland Kitchen aspirin 81 MG EC tablet Take 1 tablet (81 mg total) by mouth daily.  . cholecalciferol (VITAMIN D3) 25 MCG (1000 UNIT) tablet Take 1 tablet (1,000 Units total) by mouth daily.  . Emollient (EUCERIN) lotion Apply topically as needed for dry skin. Apply to legs and arms daily to help with dry skin.  Marland Kitchen empagliflozin (JARDIANCE) 10 MG TABS tablet Take 10 mg by mouth daily.  . fluticasone (FLONASE) 50 MCG/ACT nasal spray Place 2 sprays into both nostrils daily.   . furosemide (LASIX) 40 MG tablet Take 1 tablet (40 mg total) by mouth 2 (two) times daily. Take 1 tablet (40 mg) by mouth twice a day  . ipratropium-albuterol (DUONEB) 0.5-2.5 (3) MG/3ML SOLN Take 3 mLs by nebulization every 4 (four) hours as needed.  Marland Kitchen lisinopril (ZESTRIL) 10 MG tablet Take 10 mg by mouth daily.  . metoprolol succinate (TOPROL XL) 50 MG 24 hr tablet Take 1 tablet (50 mg total) by mouth daily.  . nitroGLYCERIN (NITROSTAT) 0.4 MG SL tablet Place 1 tablet (0.4 mg total) under the tongue every 5 (five) minutes as needed for chest pain.  Marland Kitchen omeprazole (PRILOSEC) 20 MG capsule Take 1 capsule (20 mg total) by mouth daily.  . rivaroxaban (XARELTO) 20 MG TABS tablet Take 1 tablet (20 mg total) by mouth at bedtime.  . rosuvastatin (CRESTOR) 40 MG tablet Take 40 mg by mouth  daily.  . risperiDONE (RISPERDAL) 0.5 MG tablet Take 1 tablet (0.5 mg total) by mouth 2 (two) times daily. (Patient not taking: Reported on 02/16/2020)   No facility-administered encounter medications on file as of 02/16/2020.    Allergies (verified) Penicillin g, Sulfa antibiotics, Tiotropium, and Zoloft [sertraline hcl]   History: Past Medical History:  Diagnosis Date  . Anginal pain ([place])   . Anxiety disorder   . Asthma   . Atresia of esophagus without fistula    . CAD (coronary artery disease)   . Cellulitis   . CHF (congestive heart failure) ([place])    NYHA CLASS III,CHRONIC,DIASTOLIC  . COPD (chronic obstructive pulmonary disease) ([place])   . Diabetes mellitus without complication ([place])   . Edema    RIGHT LOWER LEG  . Gastroesophageal reflux   . H/O: GI bleed   . History of pneumonia    Remote  . History of scarlet fever    Childhood  . Hyperlipidemia   . Hypertension   . Myocardial infarction ([place]) 2009  . Obesity   . Obstructive sleep apnea   . Pain    CHRONIC BACK / ABDOMINAL  . Panic disorder   . Peripheral venous insufficiency   . PTSD (post-traumatic stress disorder)   . Retinopathy    DIABETIC  . Stasis, venous   . Vertigo    Past Surgical History:  Procedure Laterality Date  . CARDIAC CATHETERIZATION    . CATARACT EXTRACTION Left   . CATARACT EXTRACTION W/PHACO Right 05/03/2017   Procedure: CATARACT EXTRACTION PHACO AND INTRAOCULAR LENS PLACEMENT (IOC);  Surgeon: Leandrew Koyanagi, MD;  Location: [facility] ORS;  Service: Ophthalmology;  Laterality: Right;  [place] 00:35.3 AP% 12.3 CDE 4.33 Fluid Pack lot # [id number]       . CORONARY ANGIOPLASTY WITH STENT PLACEMENT  2002  . CORONARY ANGIOPLASTY WITH STENT PLACEMENT  1999  . CORONARY ARTERY BYPASS GRAFT     x7  . ESOPHAGOGASTRODUODENOSCOPY N/A 09/19/2016   Procedure: ESOPHAGOGASTRODUODENOSCOPY (EGD);  Surgeon: Lollie Sails, MD;  Location: [facility] ENDOSCOPY;  Service: Endoscopy;  Laterality: N/A;   Family History  Problem Relation Age of Onset  . Heart attack Mother   . Hypertension Mother   . Hyperlipidemia Mother   . Heart attack Brother 26       MI  . Coronary artery disease Other    Social History   Socioeconomic History  . Marital status: Single    Spouse name: Not on file  . Number of children: Not on file  . Years of education: Not on file  . Highest education level: Not on file  Occupational History    Comment: disabled  Tobacco Use  . Smoking  status: Never Smoker  . Smokeless tobacco: Never Used  Vaping Use  . Vaping Use: Never used  Substance and Sexual Activity  . Alcohol use: No  . Drug use: No  . Sexual activity: Not Currently  Other Topics Concern  . Not on file  Social History Narrative   Disabled   Single   Social Determinants of Health   Financial Resource Strain: Low Risk   . Difficulty of Paying Living Expenses: Not hard at all  Food Insecurity: No Food Insecurity  . Worried About Charity fundraiser in the Last Year: Never true  . Ran Out of Food in the Last Year: Never true  Transportation Needs: No Transportation Needs  . Lack of Transportation (Medical): No  . Lack of Transportation (Non-Medical): No  Physical Activity: Inactive  . Days of Exercise per Week: 0 days  . Minutes of Exercise per Session: 0 min  Stress: No Stress Concern Present  . Feeling of Stress : Not at all  Social Connections: Moderately Integrated  . Frequency of Communication with Friends and Family: More than three times a week  . Frequency of Social Gatherings with Friends and Family: More than three times a week  . Attends Religious Services: More than 4 times per year  . Active Member of Clubs or Organizations: Yes  . Attends Archivist Meetings: More than 4 times per year  . Marital Status: Never married    Tobacco Counseling Counseling given: Not Answered   Clinical Intake:  Pre-visit preparation completed: Yes  Pain : No/denies pain     Nutritional Status: BMI > 30  Obese Nutritional Risks: None Diabetes: Yes  How often do you need to have someone help you when you read instructions, pamphlets, or other written materials from your doctor or pharmacy?: 1 - Never  Diabetic? Yes Nutrition Risk Assessment:  Has the patient had any N/V/D within the last 2 months?  No  Does the patient have any non-healing wounds?  No  Has the patient had any unintentional weight loss or weight gain?  No    Diabetes:  Is the patient diabetic?  Yes  If diabetic, was a CBG obtained today?  No  Did the patient bring in their glucometer from home?  No  How often do you monitor your CBG's? Doesn't check.   Financial Strains and Diabetes Management:  Are you having any financial strains with the device, your supplies or your medication? No .  Does the patient want to be seen by Chronic Care Management for management of their diabetes?  No  Would the patient like to be referred to a Nutritionist or for Diabetic Management?  No   Diabetic Exams:  Diabetic Eye Exam: Completed 06/08/2019 Diabetic Foot Exam: Completed 06/30/2019  Interpreter Needed?: No  Information entered by :: NAllen [name]   Activities of Daily Living In your present state of health, do you have any difficulty performing the following activities: 02/16/2020 10/01/2019  Hearing? N N  Vision? Y N  Comment floaters -  Difficulty concentrating or making decisions? N N  Walking or climbing stairs? N N  Dressing or bathing? N N  Doing errands, shopping? N N  Preparing Food and eating ? N -  Using the Toilet? N -  In the past six months, have you accidently leaked urine? Y -  Comment due to medication -  Do you have problems with loss of bowel control? N -  Managing your Medications? N -  Managing your Finances? N -  Housekeeping or managing your Housekeeping? N -  Some recent data might be hidden    Patient Care Team: [name], NP as PCP - General (Nurse Practitioner) Alisa Graff, FNP as Nurse Practitioner (Cardiology) Minna Merritts, MD as Consulting Physician (Cardiology) De Hollingshead, [facility] as Pharmacist (Pharmacist) [name], RN as Case Manager (General Practice) [name], LCSW as Social Worker (Licensed Clinical Social Worker)  Indicate any recent Medical Services you may have received from other than [facility] providers in the past year (date may be approximate).     Assessment:    This is a routine wellness examination for Winnie.  Hearing/Vision screen  Hearing Screening   125Hz  250Hz  500Hz  1000Hz  2000Hz  3000Hz  4000Hz  6000Hz  8000Hz   Right ear:           Left ear:           Vision Screening Comments: Regular eye exams, [facility]  Dietary issues and exercise activities discussed: Current Exercise Habits: The patient does not participate in regular exercise at present  Goals    .  Patient Stated      02/16/2020, to walk better    .  PharmD "I am confused by my medications" (pt-stated)      Current Barriers:  . Polypharmacy; complex patient with multiple comorbidities including ASCVD (hx CABG, MI); Afib, CHF, T2DM, osteoporosis, hx prostate cancer (s/p Lupron therapy, d/c d/t behavioral disturbances) . Patient notes that sometimes, he will take his morning medications and then throw up. At first attributes to a medication, then mentions mucus, then attributes to acid reflux. Also expresses concerns about omeprazole, as he read that it should only be used for a short course. . Current medications as prescribed: o Afib/HTN/HFpEF: Xarelto 20 mg QPM, furosemide 40 mg BID, lisinopril 10 mg QAM, metoprolol succinate 50 mg QAM,  o ASCVD risk reduction: ASA 81 mg daily, rosuvastatin 40 mg daily; per records, last stent 2002 o T2DM: Jardiance 10 mg daily; A1c at goal <7% o Osteoporosis: Last DEXA showed t score -3.1% at femoral neck.; Vitamin D 1000 units daily o Anxiety, ?schizophrenia: risperdone 0.5 mg BID  Pharmacist Clinical Goal(s):  Marland Kitchen Over the next 90 days, patient will work with PharmD and provider towards optimized medication management  Interventions: . Comprehensive medication review performed, medication list updated in electronic medical record . Inter-disciplinary care team collaboration (see longitudinal plan of care) . Reviewed that optimal PPI dosing is 30 minutes before a meal. Discussed a plan of taking out the omeprazole capsule  (blue/purple), taking on an empty stomach at least 30 minutes before a meal, then eating breakfast, then taking his other morning medications. Patient agrees to this to see if helps reduce acid/reduce episodes of vomiting . Discussed that we generally use PPI short term for acid reflux, but if issue is not resolved with short course, continued therapy is appropriate. He verbalizes understanding. Marland Kitchen Contacted [employer] to see if they could package the omeprazole separately moving forward. They cannot do this based on constraints of packaging and still be able to offer to the patient for free.   Patient Self Care Activities:  . Patient will take medications as prescribed  Please see past updates related to this goal by clicking on the "Past Updates" button in the selected goal      .  RNCM: I have a lot of health problems but I manage okay      CARE PLAN ENTRY (see longtitudinal plan of care for additional care plan information)  Current Barriers:  . Chronic Disease Management support, education, and care coordination needs related to Atrial Fibrillation, CHF, CAD, HTN, HLD, DMII, Anxiety, and Depression  Clinical Goal(s) related to Atrial Fibrillation, CHF, CAD, HTN, HLD, DMII, Anxiety, and Depression:  Over the next 120 days, patient will:  . Work with the care management team to address educational, disease management, and care coordination needs  . Begin or continue self health monitoring activities as directed today  Measure and record blood pressure 3 times per week, and Measure and record weight daily, DMII under control at this time as evidence by Hemoglobin A1C at 6.6 and last fasting blood sugar on labs of 113 . Call provider office for new or worsened  signs and symptoms related to Hypoglycemia or Hyperglycemia, Blood pressure findings outside established parameters, Weight outside established parameters, and New or worsened symptom related to Chronic Health Conditions . Call care  management team with questions or concerns . Verbalize basic understanding of patient centered plan of care established today  Interventions related to Atrial Fibrillation, CHF, CAD, HTN, HLD, DMII, Anxiety, and Depression:  . Evaluation of current treatment plans and patient's adherence to plan as established by provider . Assessed patient understanding of disease states . Assessed patient's education and care coordination needs- discussed taking blood pressures (patient does not have a cuff- discussed the OTC product book through [facility] and the monitoring program through [facility]).  02-06-2020: The patient is still not checking his blood pressure at home. States when he goes to the doctor it is good. The patient denies any issues with blood pressure at this time. Will continue to monitor.  . Assessed the patient's need for blood glucose testing on a daily basis.  The patient's most recent hemoglobin A1C is 6.6 and fasting blood sugar was 113.  Will continue to monitor for changes . Education to the patient about calling [facility] Customer service line to ask about Hardin Negus life alert system or comparable system since he lives alone   . Provided disease specific education to patient- education on the benefits of daily monitoring of VS and daily weight checks to maintain health and well being.  02-06-2020: The patient still does not weigh daily or check VS. States that he has lost weight and now weighs 236.  He had to take out a loan so he could buy new clothes.  . Education on DASH diet and ADA diet- written information provided through the myChart function and EMMI.  02-06-2020: The patient endorses eating well and watching his sodium content. The patient states that he has a Medical sales representative and had it stocked well with food. Denies any needs for food resources at this time.  . Evaluation: The patient states that he has been evicted and he needs help with transportation so he can sign an appeal. C3 guide referral for help with  transportation, impending eviction.  The patient may have to go to a shelter to stay. Discussed with the LCSW.  The LCSW has had 2 failed attempts at reaching the patient today. C3 care guide referral has been placed. 02-06-2020: The patient is very upset that the APS SW came unannounced yesterday and would not talk to him just kept going through his house. Explained that this was a process to keep him in his home and they could come at any time unannounced. The patient wanted this to "STOP". The patient states that this is causing him a lot of anxiety and "embarrassment". Education and support given. Explained to the patient he would have to talk to APS and the SW that the [facility] could not answer these questions for him. The RNCM finally was able to get the patient to understand that APS was not associated with [facility].  The patient kept circling back to the events of 02-05-2020 and repeating him being upset that the APS SW came to his home unannounced.   Boundaries and redirection with the patient set. Explained the role of the RNCM in his health and well being. The patient verbalized understanding. Will continue to work with the patient on his healthcare needs.  . Evaluation of the patients multiple medical complaints as expressed to a nurse over the weekend. The patient denies any nausea, vomiting, or  other health concerns. His biggest concern today is the need to sign an appeal so he can stay in his house. The RNCM has reviewed for changes in his medical condition or worsening signs and symptoms of chronic conditions to be evaluated in the ER.  The patient verbalized understanding. 02-06-2020: The patient denies any health concerns related to his chronic conditions of AF/CHF/CAD, HTN, HLD and DMII.  The patient states that he has an appointment with the cancer MD on 02-12-2020 and will arrange transportation to the appointment. States he can do this.  The patient has a new ipad and is trying to keep track of his health,  appointments and other things. The patient endorses taking medications as directed.  Nash Dimmer with appropriate clinical care team members regarding patient needs- patient is already involved with LCSW and Pharmacist, will continue to monitor for changes in condition and support. 02-06-2020: Supplied the numbers to the CCM team to the patient as he had an issue with his telephone and had to get a new one. It deleted his phone numbers.  The patient has contact information for the CCM team now.  . Evaluation of upcoming appointments. Sees oncologist on 02-12-2020 and next appointment with pcp on 03/24/2020.   Patient Self Care Activities related to Atrial Fibrillation, CHF, CAD, HTN, HLD, DMII, Anxiety, and Depression:  . Patient is unable to independently self-manage chronic health conditions  Please see past updates related to this goal by clicking on the "Past Updates" button in the selected goal      .  SW-"I need more help." (pt-stated)      Current Barriers:  . Financial constraints related to managing health care . Limited social support . ADL IADL limitations . Social Isolation . Limited access to caregiver . Inability to perform ADL's independently . Inability to perform IADL's independently . Patient's current residence was put at risk due to hoarding through [place]):  Marland Kitchen Over the next 120 days, patient will work with SW to address concerns related to lack of support within the home . Over the next 120 days, patient will demonstrate improved health management independence as evidenced by implementing appropriate self-care into daily routine    Interventions: . Patient interviewed and appropriate assessments performed-new referral . Provided patient with information about level of care options, personal care service resources and Medicaid benefits.  . Discussed plans with patient for ongoing care management follow up and provided  patient with direct contact information for care management team . Assisted patient/caregiver with obtaining information about health plan benefits . A voluntary and extensive discussion about advanced care planning including explanation and discussion of advanced was undertaken with the patient.  Explanation regarding healthcare proxy and living will was reviewed and packet with forms with explanation of how to fill them out was given.   . Provided education to patient/caregiver regarding level of care options. . Provided education to patient/caregiver about Hospice and/or Palliative Care services . Patient is new to [facility] and is in need of community resource connection and is unable to provide Medicaid number and reports that DSS informed him that they are in the process of transitioning his Medicaid from [facility] to [employer]. Patient is agreeable to [facility] referral once Medicaid information has been received.  . Emotional support provided due to ongoing stress/anxiety/anger outburst. Solution focused therapy implemented into entire session today. Marland Kitchen LCSW received care coordination call from patient's [place]. She reports  that program was unable to even get in the door at his yearly inspection due to hoarding and had to reschedule it. They gave patient a 30 day notice at this point. However, when they came back to complete the inspection, no progress had been made and he failed the inspection and is at risk for losing [employer]. LCSW typed a letter on behalf of patient to advocate for his situation considering his deliberating conditions and sent it to his caseworker. Patient had a hearing on 11/12/19 and they decided to allow patient another chance and did not discharge him from their program. However, patient must continue to work on his hoarding and meet their favorable living conditions requirements. LCSW will update entire team.  . LCSW FILED APS REPORT on 11/11/19 FOR SUSPECTED  NEGLECT OF SELF AND INABILITY TO LIVE IN THE COMMUNITY INDEPENDENTLY.  .  LCSW completed call to patient's assigned APS caseworker Synetta Fail at [phone] on 12/08/19 and discussed patient's case. Lolita Patella reports that patient has made progress with getting rid of a lot of his belongings that he was hoarding but admits that his home "still has a long way to go" and "is still a mess." Caseworker has been working with patient since 11/11/19 and reports that she is going to offer patient their treatment services but he will have to be agreeable to this service. APS caseworker reports that her biggest concern is the fact that patient is not taking his medications as prescribed. LCSW informed her that our clinic Pharmacist is now involved. LCSW will update clinic Pharmacist. Lolita Patella agrees to contact CCM LCSW if any urgent current arise regarding this case.  Marland Kitchen LCSW completed call to patient on 12/10/19 and he reports that his APS caseworker visited him yesterday on 12/09/19 and offered him treatment services but he signed a form declining those services. Patient was provided APS case worker's number in the case that he changes his mind and wishes to gain this support. Patient was adamant about not accepting these services. Patient was provided brief Chief Technology Officer. Patient appreciative of call and education provided. LCSW spoke to the APS caseworker yesteday. Bridgette Habermann was the one that visited him yesterday because Synetta Fail was injured and working from home so this is probably why he was suspicious at first. She informed me that they have closed his case. She said he scored a 30 out of 30 on his competency test and if we wanted to challenge this then we would need it in writing by his doctor. Brooke offered him their treatment program which is basically an extension (ongoing service) through APS and would allow them to go in his home a few times a month to check in on him to make sure he is taking his  medications but he adamantly refused this saying that he did not need anyone coming to his home to check up on him. He declined to sign their treatment service plan form. They encouraged him to consider [facility] which is opening back up in June but he declined this as well stating his negative experience with [facility]. I called patient on 12/10/19 and asked him to reconsider but he stated that he did not want either service they offered. LCSW called him again on 12/11/19 and he was agreeable after LCSW completed a 3 way call with him and APS caseworker Synetta Fail. Patient reports he was confused because the social worker's name was Jerene Pitch just like his CCM Education officer, museum. He also reports  that he suffered from a recent loss which caused him increased anxiety which led him to refuse services. Synetta Fail is agreeable to contact APS case worker Bridgette Habermann and have her come back out to visit patient and sign the treatment form. Patient is agreeable to this plan. LCSW update team.  UPDATE-LCSW received message from [facility] front desk: Pt needs call back, states he is going to discontinue care if not, States Protective Services is coming to his home unannounced and taking pictures of meds, states has done something maybe to his laptop and Brook in the office has sent them out. Patient is very agitated. LCSW completed call to patient on 01/12/20 and he reports that he does not feel comfortable with APS treatment team caseworker Bridgette Habermann coming into his home anymore because she took pictures of his medications. Patient is worried that APS will make him placed into a facility. LCSW explained that PCP and LCSW encouraged patient to consider APS treatment services so that he can continue to live in the community independently. Patient wanted LCSW to confirm that Bridgette Habermann works for APS and LCSW confirmed this as she knows caseworker well. Patient was very upset throughout entire phone call.  Patient reports that he may refuse APS treatment services as he continues to feel uncomfortable with them coming into his home. LCSW will update PCP.   Patient Self Care Activities:  . Attends all scheduled provider appointments . Lacks social connections  Please see past updates related to this goal by clicking on the "Past Updates" button in the selected goal        Depression Screen PHQ 2/9 Scores 02/16/2020 12/10/2019 11/05/2019 10/07/2019 09/09/2019 08/11/2019 03/01/2018  PHQ - 2 Score 0 0 0 0 0 0 0  PHQ- 9 Score - 0 0 0 0 0 -    Fall Risk Fall Risk  02/16/2020 10/01/2019 08/20/2019 08/11/2019 03/01/2018  Falls in the past year? 1 0 0 0 No  Comment passed out - - - -  Number falls in past yr: 0 0 0 0 -  Injury with Fall? 0 0 0 0 -  Risk for fall due to : Medication side effect;History of fall(s);Impaired balance/gait - Impaired balance/gait - -  Follow up Falls evaluation completed;Education provided;Falls prevention discussed Falls evaluation completed Falls evaluation completed - -    Any stairs in or around the home? No  If so, are there any without handrails? n/a Home free of loose throw rugs in walkways, pet beds, electrical cords, etc? Yes  Adequate lighting in your home to reduce risk of falls? Yes   ASSISTIVE DEVICES UTILIZED TO PREVENT FALLS:  Life alert? No  Use of a cane, walker or w/c? Yes  Grab bars in the bathroom? Yes  Shower chair or bench in shower? Yes  Elevated toilet seat or a handicapped toilet? No   TIMED UP AND GO:  Was the test performed? No .    Cognitive Function:        Immunizations  There is no immunization history on file for this patient.  TDAP status: Due, Education has been provided regarding the importance of this vaccine. Advised may receive this vaccine at local pharmacy or Health Dept. Aware to provide a copy of the vaccination record if obtained from local pharmacy or Health Dept. Verbalized acceptance and understanding. Flu Vaccine  status: Up to date Pneumococcal vaccine status: Declined,  Education has been provided regarding the importance of this vaccine but patient still  declined. Advised may receive this vaccine at local pharmacy or Health Dept. Aware to provide a copy of the vaccination record if obtained from local pharmacy or Health Dept. Verbalized acceptance and understanding.  Covid-19 vaccine status: Needs second dose  Qualifies for Shingles Vaccine? Yes   Zostavax completed No   Shingrix Completed?: No.    Education has been provided regarding the importance of this vaccine. Patient has been advised to call insurance company to determine out of pocket expense if they have not yet received this vaccine. Advised may also receive vaccine at local pharmacy or Health Dept. Verbalized acceptance and understanding.  Screening Tests Health Maintenance  Topic Date Due  . COVID-19 Vaccine (1) Never done  . COLONOSCOPY  Never done  . PNA vac Low Risk Adult (1 of 2 - PCV13) Never done  . TETANUS/TDAP  08/10/2020 (Originally 09/10/1974)  . Hepatitis C Screening  08/10/2020 (Originally 04-Dec-1955)  . HIV Screening  09/08/2020 (Originally 09[birth year]10/1969)  . INFLUENZA VACCINE  03/07/2020  . HEMOGLOBIN A1C  06/11/2020  . FOOT EXAM  06/29/2020  . OPHTHALMOLOGY EXAM  09/11/2020    Health Maintenance  Health Maintenance Due  Topic Date Due  . COVID-19 Vaccine (1) Never done  . COLONOSCOPY  Never done  . PNA vac Low Risk Adult (1 of 2 - PCV13) Never done    Colorectal cancer screening: States had   Lung Cancer Screening: (Low Dose CT Chest recommended if Age 72-80 years, 30 pack-year currently smoking OR have quit w/in 15years.) does not qualify.   Lung Cancer Screening Referral: no   Additional Screening:  Hepatitis C Screening: does qualify;   Vision Screening: Recommended annual ophthalmology exams for early detection of glaucoma and other disorders of the eye. Is the patient up to date with their annual eye exam?   Yes  Who is the provider or what is the name of the office in which the patient attends annual eye exams? [facility] If pt is not established with a provider, would they like to be referred to a provider to establish care? No .   Dental Screening: Recommended annual dental exams for proper oral hygiene  Community Resource Referral / Chronic Care Management: CRR required this visit?  No   CCM required this visit?  No      Plan:     I have personally reviewed and noted the following in the patient's chart:   . Medical and social history . Use of alcohol, tobacco or illicit drugs  . Current medications and supplements . Functional ability and status . Nutritional status . Physical activity . Advanced directives . List of other physicians . Hospitalizations, surgeries, and ER visits in previous 12 months . Vitals . Screenings to include cognitive, depression, and falls . Referrals and appointments  In addition, I have reviewed and discussed with patient certain preventive protocols, quality metrics, and best practice recommendations. A written personalized care plan for preventive services as well as general preventive health recommendations were provided to patient.     Kellie Simmering, [name]   0/23/3435   Nurse Notes: Declined 6 CIT

## 2020-02-16 NOTE — Patient Instructions (Signed)
[name] , Thank you for taking time to come for your Medicare Wellness Visit. I appreciate your ongoing commitment to your health goals. Please review the following plan we discussed and let me know if I can assist you in the future.   Screening recommendations/referrals: Colonoscopy: states had Recommended yearly ophthalmology/optometry visit for glaucoma screening and checkup Recommended yearly dental visit for hygiene and checkup  Vaccinations: Influenza vaccine: states had Pneumococcal vaccine: due Tdap vaccine: states had Shingles vaccine: discussed   Covid-19: 01/18/2020, due for second dose  Advanced directives: Please bring a copy of your POA (Power of [place]) and/or Living Will to your next appointment.   Conditions/risks identified: none  Next appointment: Follow up in one year for your annual wellness visit.   Preventive Care 35 Years and Older, Male Preventive care refers to lifestyle choices and visits with your health care provider that can promote health and wellness. What does preventive care include?  A yearly physical exam. This is also called an annual well check.  Dental exams once or twice a year.  Routine eye exams. Ask your health care provider how often you should have your eyes checked.  Personal lifestyle choices, including:  Daily care of your teeth and gums.  Regular physical activity.  Eating a healthy diet.  Avoiding tobacco and drug use.  Limiting alcohol use.  Practicing safe sex.  Taking low doses of aspirin every day.  Taking vitamin and mineral supplements as recommended by your health care provider. What happens during an annual well check? The services and screenings done by your health care provider during your annual well check will depend on your age, overall health, lifestyle risk factors, and family history of disease. Counseling  Your health care provider may ask you questions about your:  Alcohol use.  Tobacco  use.  Drug use.  Emotional well-being.  Home and relationship well-being.  Sexual activity.  Eating habits.  History of falls.  Memory and ability to understand (cognition).  Work and work Statistician. Screening  You may have the following tests or measurements:  Height, weight, and BMI.  Blood pressure.  Lipid and cholesterol levels. These may be checked every 5 years, or more frequently if you are over 40 years old.  Skin check.  Lung cancer screening. You may have this screening every year starting at age 12 if you have a 30-pack-year history of smoking and currently smoke or have quit within the past 15 years.  Fecal occult blood test (FOBT) of the stool. You may have this test every year starting at age 64.  Flexible sigmoidoscopy or colonoscopy. You may have a sigmoidoscopy every 5 years or a colonoscopy every 10 years starting at age 38.  Prostate cancer screening. Recommendations will vary depending on your family history and other risks.  Hepatitis C blood test.  Hepatitis B blood test.  Sexually transmitted disease (STD) testing.  Diabetes screening. This is done by checking your blood sugar (glucose) after you have not eaten for a while (fasting). You may have this done every 1-3 years.  Abdominal aortic aneurysm (AAA) screening. You may need this if you are a current or former smoker.  Osteoporosis. You may be screened starting at age 15 if you are at high risk. Talk with your health care provider about your test results, treatment options, and if necessary, the need for more tests. Vaccines  Your health care provider may recommend certain vaccines, such as:  Influenza vaccine. This is recommended every year.  Tetanus, diphtheria, and acellular pertussis (Tdap, Td) vaccine. You may need a Td booster every 10 years.  Zoster vaccine. You may need this after age 11.  Pneumococcal 13-valent conjugate (PCV13) vaccine. One dose is recommended after age  6.  Pneumococcal polysaccharide (PPSV23) vaccine. One dose is recommended after age 69. Talk to your health care provider about which screenings and vaccines you need and how often you need them. This information is not intended to replace advice given to you by your health care provider. Make sure you discuss any questions you have with your health care provider. Document Released: 08/20/2015 Document Revised: 04/12/2016 Document Reviewed: 05/25/2015 Elsevier Interactive Patient Education  2017 [place] Prevention in the Home Falls can cause injuries. They can happen to people of all ages. There are many things you can do to make your home safe and to help prevent falls. What can I do on the outside of my home?  Regularly fix the edges of walkways and driveways and fix any cracks.  Remove anything that might make you trip as you walk through a door, such as a raised step or threshold.  Trim any bushes or trees on the path to your home.  Use bright outdoor lighting.  Clear any walking paths of anything that might make someone trip, such as rocks or tools.  Regularly check to see if handrails are loose or broken. Make sure that both sides of any steps have handrails.  Any raised decks and porches should have guardrails on the edges.  Have any leaves, snow, or ice cleared regularly.  Use sand or salt on walking paths during winter.  Clean up any spills in your garage right away. This includes oil or grease spills. What can I do in the bathroom?  Use night lights.  Install grab bars by the toilet and in the tub and shower. Do not use towel bars as grab bars.  Use non-skid mats or decals in the tub or shower.  If you need to sit down in the shower, use a plastic, non-slip stool.  Keep the floor dry. Clean up any water that spills on the floor as soon as it happens.  Remove soap buildup in the tub or shower regularly.  Attach bath mats securely with double-sided  non-slip rug tape.  Do not have throw rugs and other things on the floor that can make you trip. What can I do in the bedroom?  Use night lights.  Make sure that you have a light by your bed that is easy to reach.  Do not use any sheets or blankets that are too big for your bed. They should not hang down onto the floor.  Have a firm chair that has side arms. You can use this for support while you get dressed.  Do not have throw rugs and other things on the floor that can make you trip. What can I do in the kitchen?  Clean up any spills right away.  Avoid walking on wet floors.  Keep items that you use a lot in easy-to-reach places.  If you need to reach something above you, use a strong step stool that has a grab bar.  Keep electrical cords out of the way.  Do not use floor polish or wax that makes floors slippery. If you must use wax, use non-skid floor wax.  Do not have throw rugs and other things on the floor that can make you trip. What can I do  with my stairs?  Do not leave any items on the stairs.  Make sure that there are handrails on both sides of the stairs and use them. Fix handrails that are broken or loose. Make sure that handrails are as long as the stairways.  Check any carpeting to make sure that it is firmly attached to the stairs. Fix any carpet that is loose or worn.  Avoid having throw rugs at the top or bottom of the stairs. If you do have throw rugs, attach them to the floor with carpet tape.  Make sure that you have a light switch at the top of the stairs and the bottom of the stairs. If you do not have them, ask someone to add them for you. What else can I do to help prevent falls?  Wear shoes that:  Do not have high heels.  Have rubber bottoms.  Are comfortable and fit you well.  Are closed at the toe. Do not wear sandals.  If you use a stepladder:  Make sure that it is fully opened. Do not climb a closed stepladder.  Make sure that both  sides of the stepladder are locked into place.  Ask someone to hold it for you, if possible.  Clearly mark and make sure that you can see:  Any grab bars or handrails.  First and last steps.  Where the edge of each step is.  Use tools that help you move around (mobility aids) if they are needed. These include:  Canes.  Walkers.  Scooters.  Crutches.  Turn on the lights when you go into a dark area. Replace any light bulbs as soon as they burn out.  Set up your furniture so you have a clear path. Avoid moving your furniture around.  If any of your floors are uneven, fix them.  If there are any pets around you, be aware of where they are.  Review your medicines with your doctor. Some medicines can make you feel dizzy. This can increase your chance of falling. Ask your doctor what other things that you can do to help prevent falls. This information is not intended to replace advice given to you by your health care provider. Make sure you discuss any questions you have with your health care provider. Document Released: 05/20/2009 Document Revised: 12/30/2015 Document Reviewed: 08/28/2014 Elsevier Interactive Patient Education  2017 [employer].

## 2020-02-17 ENCOUNTER — Telehealth: Payer: Self-pay

## 2020-02-17 NOTE — Telephone Encounter (Signed)
He can bring this paperwork in and I will sign off and provide letter.

## 2020-02-17 NOTE — Telephone Encounter (Signed)
Copied from [place] [id number]. Topic: General - Other >> Feb 17, 2020  2:23 PM [name] wrote: Reason for CRM: Pt has Evan [name] duty and needs [name] to sign off on it due to pts health, not being able to stand long and no transportation / Pt needs signed off by July 28th/ please advise asap so pt can bring in paperwork   Routing to provider to advise.

## 2020-02-18 NOTE — Telephone Encounter (Signed)
Patient notified

## 2020-02-20 NOTE — Telephone Encounter (Signed)
I will need jury duty number and dates so I can place on letter -- this should be on the letter he received.

## 2020-02-20 NOTE — Telephone Encounter (Signed)
Will complete on Monday

## 2020-02-20 NOTE — Telephone Encounter (Signed)
Pt called asking if [name] can just write and note and fax that to the [employer]. Her name Eli Hose Fax is [phone]  He states he can not get a ride to the office.

## 2020-02-20 NOTE — Telephone Encounter (Signed)
Patient is calling he will drop off the paperwork today.

## 2020-02-23 ENCOUNTER — Encounter: Payer: Self-pay | Admitting: Nurse Practitioner

## 2020-02-23 ENCOUNTER — Telehealth: Payer: Self-pay

## 2020-02-23 NOTE — Telephone Encounter (Signed)
Contacted pt and advised letter is complete however is missing pt signature so it cannot be faxed. Pt stated he does not have a ride but will do his best to come before 03/02/2020 and sign so we can fax the form before 5pm 03/03/2020. From is at the front of the office.

## 2020-02-23 NOTE — Telephone Encounter (Signed)
Completed and given to [facility] staff to alert patient, he needs to pick up and sign.

## 2020-02-24 ENCOUNTER — Telehealth: Payer: Self-pay | Admitting: Nurse Practitioner

## 2020-02-24 NOTE — Telephone Encounter (Signed)
[facility] stopped buy leave paper work for Pt to be signed for home service care.Lvm letting patient know we got the papers. Left in providers folder to be signed and reviewd.

## 2020-02-26 NOTE — Telephone Encounter (Signed)
Form signed and faxed back to [name].

## 2020-02-26 NOTE — Telephone Encounter (Signed)
Form filled out and placed in providers folder for signature.  

## 2020-03-01 ENCOUNTER — Emergency Department: Payer: Medicare Other

## 2020-03-01 ENCOUNTER — Other Ambulatory Visit: Payer: Self-pay

## 2020-03-01 ENCOUNTER — Emergency Department
Admission: EM | Admit: 2020-03-01 | Discharge: 2020-03-02 | Disposition: A | Discharge status: Home / Self Care | Payer: Medicare Other | Attending: Emergency Medicine | Admitting: Emergency Medicine

## 2020-03-01 DIAGNOSIS — Z5321 Procedure and treatment not carried out due to patient leaving prior to being seen by health care provider: Secondary | ICD-10-CM | POA: Diagnosis not present

## 2020-03-01 DIAGNOSIS — R42 Dizziness and giddiness: Secondary | ICD-10-CM | POA: Diagnosis not present

## 2020-03-01 DIAGNOSIS — J3489 Other specified disorders of nose and nasal sinuses: Secondary | ICD-10-CM | POA: Diagnosis not present

## 2020-03-01 DIAGNOSIS — R5381 Other malaise: Secondary | ICD-10-CM | POA: Diagnosis not present

## 2020-03-01 DIAGNOSIS — R404 Transient alteration of awareness: Secondary | ICD-10-CM | POA: Diagnosis not present

## 2020-03-01 DIAGNOSIS — R1110 Vomiting, unspecified: Secondary | ICD-10-CM | POA: Diagnosis not present

## 2020-03-01 DIAGNOSIS — R6889 Other general symptoms and signs: Secondary | ICD-10-CM | POA: Diagnosis not present

## 2020-03-01 DIAGNOSIS — G319 Degenerative disease of nervous system, unspecified: Secondary | ICD-10-CM | POA: Diagnosis not present

## 2020-03-01 DIAGNOSIS — Z743 Need for continuous supervision: Secondary | ICD-10-CM | POA: Diagnosis not present

## 2020-03-01 DIAGNOSIS — R197 Diarrhea, unspecified: Secondary | ICD-10-CM | POA: Insufficient documentation

## 2020-03-01 DIAGNOSIS — I6782 Cerebral ischemia: Secondary | ICD-10-CM | POA: Diagnosis not present

## 2020-03-01 LAB — CBC
HCT: 42.5 % (ref 39.0–52.0)
Hemoglobin: 13.9 g/dL (ref 13.0–17.0)
MCH: 27.6 pg (ref 26.0–34.0)
MCHC: 32.7 g/dL (ref 30.0–36.0)
MCV: 84.3 fL (ref 80.0–100.0)
Platelets: 305 10*3/uL (ref 150–400)
RBC: 5.04 MIL/uL (ref 4.22–5.81)
RDW: 16.1 % — ABNORMAL HIGH (ref 11.5–15.5)
WBC: 10 10*3/uL (ref 4.0–10.5)
nRBC: 0 % (ref 0.0–0.2)

## 2020-03-01 LAB — LIPASE, BLOOD: Lipase: 27 U/L (ref 11–51)

## 2020-03-01 LAB — COMPREHENSIVE METABOLIC PANEL
ALT: 14 U/L (ref 0–44)
AST: 16 U/L (ref 15–41)
Albumin: 4.1 g/dL (ref 3.5–5.0)
Alkaline Phosphatase: 85 U/L (ref 38–126)
Anion gap: 11 (ref 5–15)
BUN: 15 mg/dL (ref 8–23)
CO2: 22 mmol/L (ref 22–32)
Calcium: 9.2 mg/dL (ref 8.9–10.3)
Chloride: 104 mmol/L (ref 98–111)
Creatinine, Ser: 0.83 mg/dL (ref 0.61–1.24)
GFR calc Af Amer: >60 mL/min (ref >60)
GFR calc non Af Amer: >60 mL/min (ref >60)
Glucose, Bld: 117 mg/dL — ABNORMAL HIGH (ref 70–99)
Potassium: 4 mmol/L (ref 3.5–5.1)
Sodium: 137 mmol/L (ref 135–145)
Total Bilirubin: 0.9 mg/dL (ref 0.3–1.2)
Total Protein: 7.7 g/dL (ref 6.5–8.1)

## 2020-03-01 IMAGING — CT CT HEAD W/O CM
3 series · 15 of 47 positions shown, 18 images · non-contrast
Comparison: CT [DATE]

CLINICAL DATA: Vertigo

EXAM:
CT HEAD WITHOUT CONTRAST
TECHNIQUE: Contiguous axial images were obtained from the base of the skull
through the vertex without intravenous contrast.

[Series 2: head wo · axial · 0.47mm/px · z∈[-187,-52]mm · 9 of 33 slices shown, 12 images]
[im 3/33  brain]
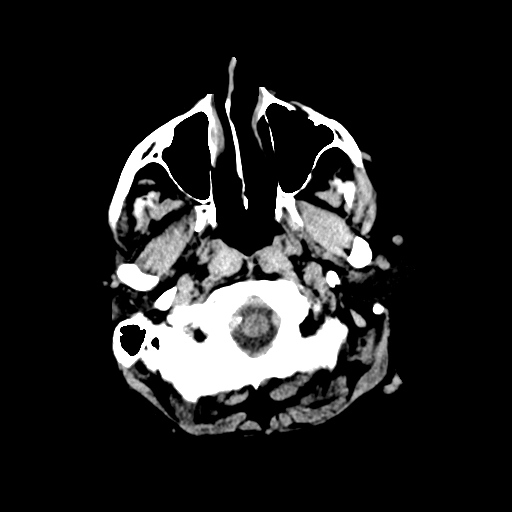
[im 3/33  bone]
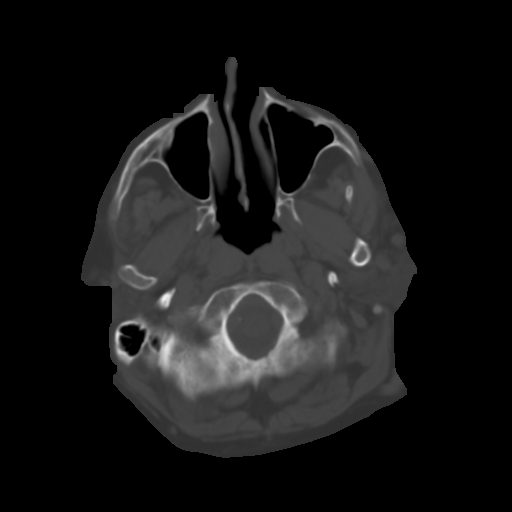
[im 6/33  brain]
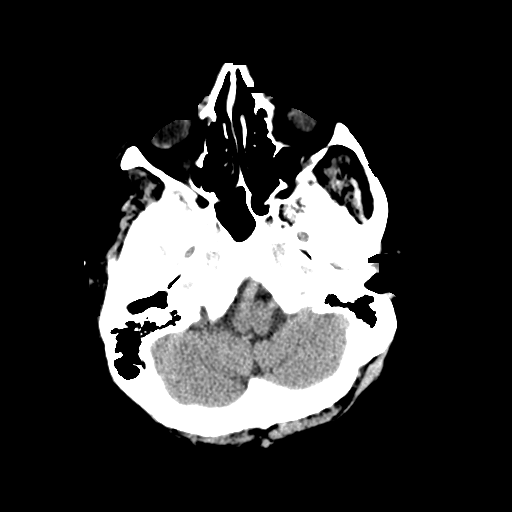
[im 9/33  brain]
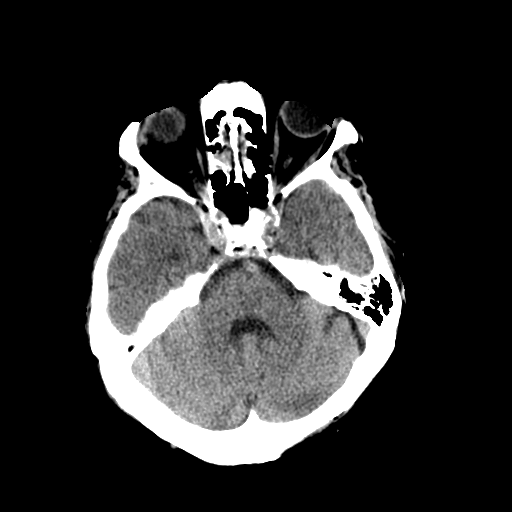
[im 13/33  brain]
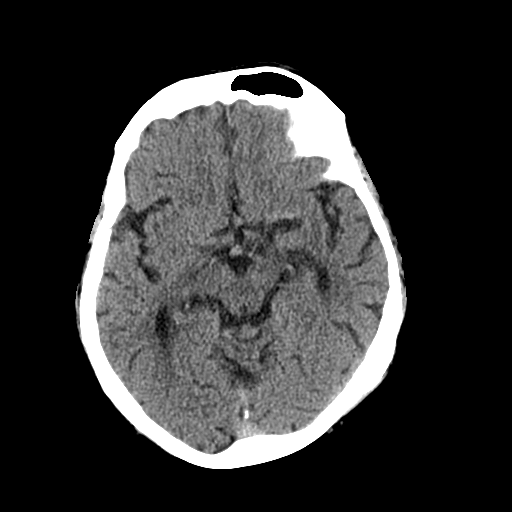
[im 17/33  brain]
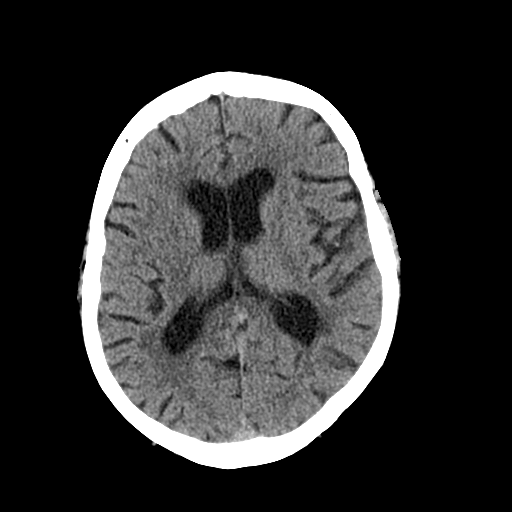
[im 17/33  bone]
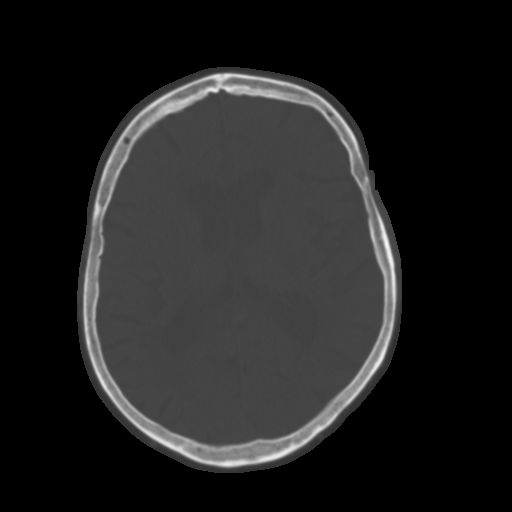
[im 20/33  brain]
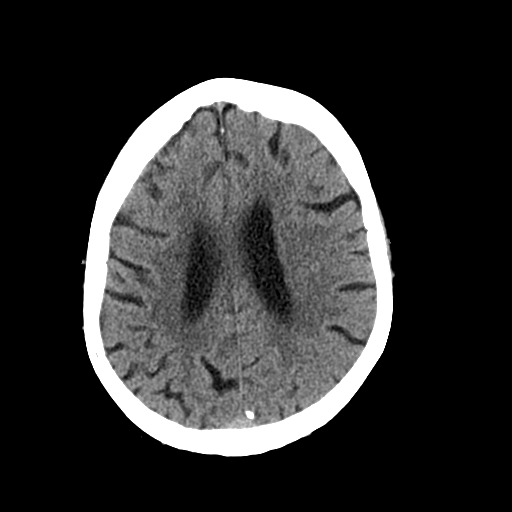
[im 24/33  brain]
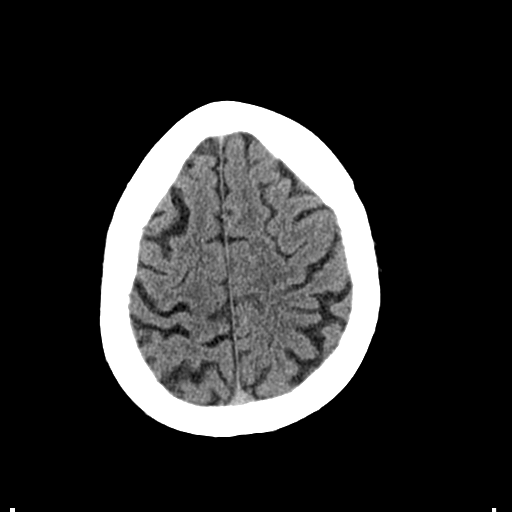
[im 27/33  brain]
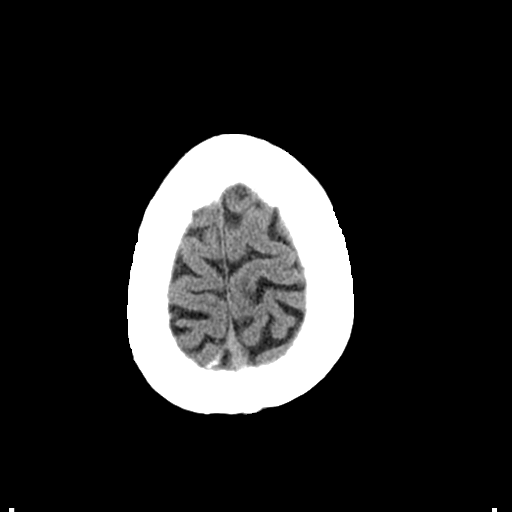
[im 30/33  brain]
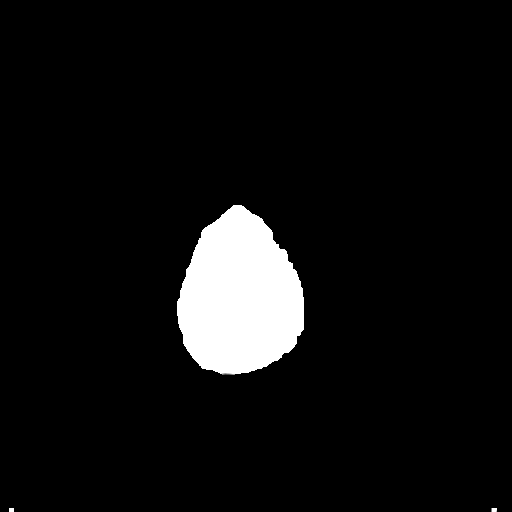
[im 30/33  bone]
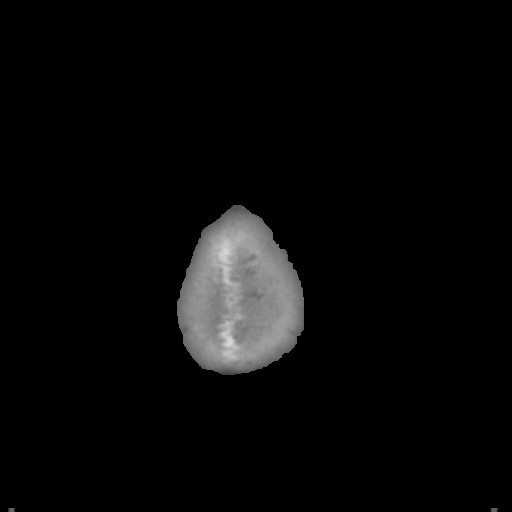

[Series 4: coronal soft tissue · coronal · 0.30mm/px · 3 of 74 slices shown]
[im 26/74  brain]
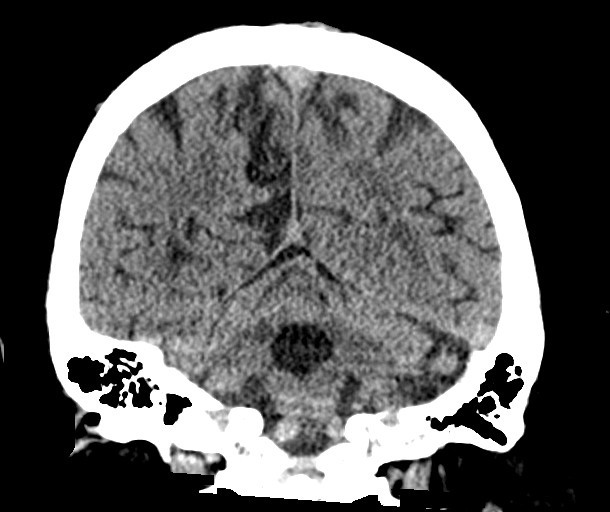
[im 33/74  brain]
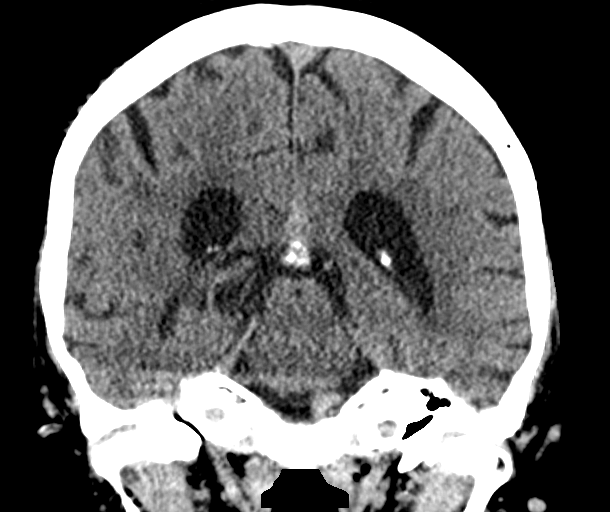
[im 41/74  brain]
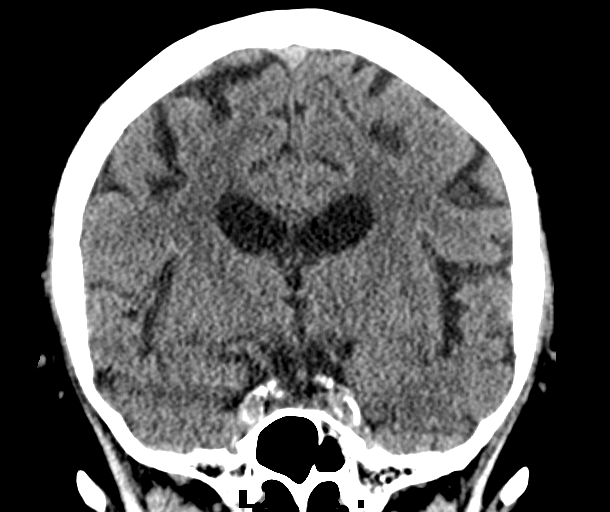

[Series 5: sagittal soft tissue · sagittal · 0.30mm/px · 3 of 61 slices shown]
[im 21/61  brain]
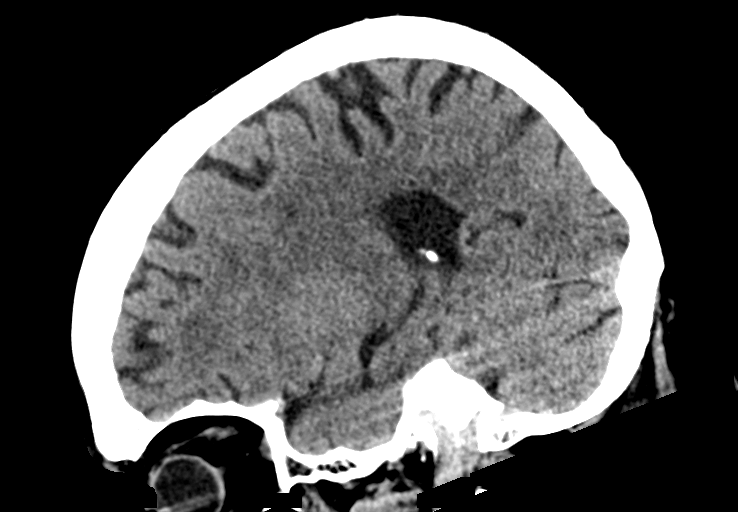
[im 31/61  brain]
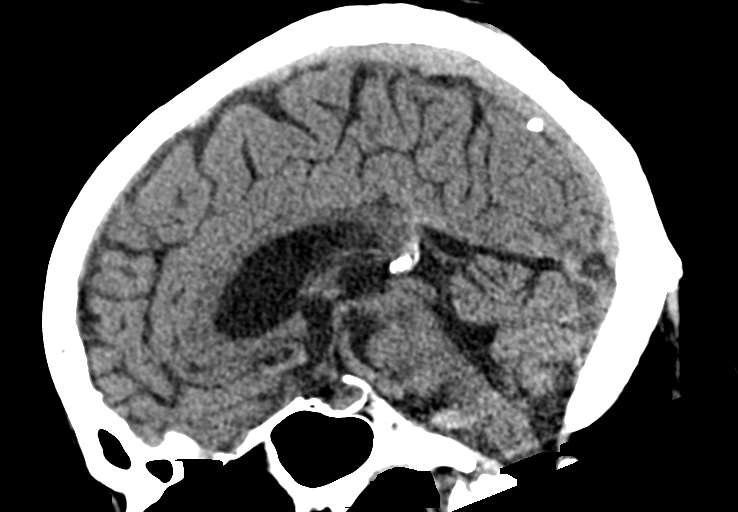
[im 41/61  brain]
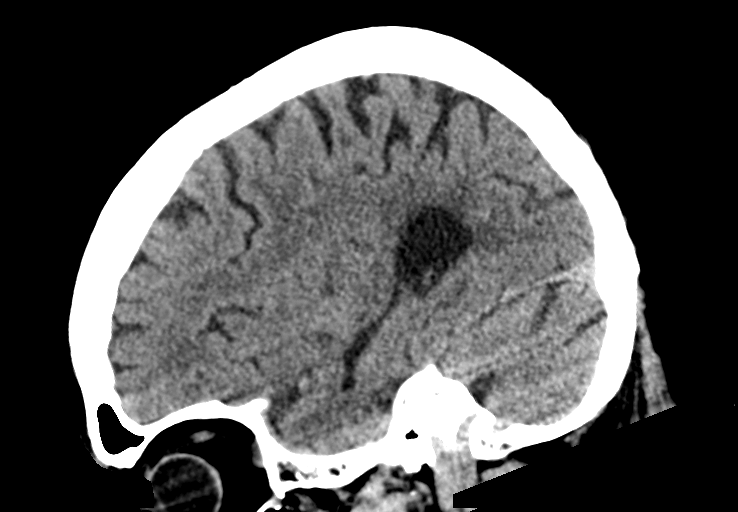

[15 of 47 positions shown; findings below may reference images not displayed]

FINDINGS: Brain: No acute territorial infarction, hemorrhage, or intracranial
mass. Mild atrophy. Mild chronic small vessel ischemic change of the
white matter. Stable ventricle size.

Vascular: No hyperdense vessels. Vertebral and carotid vascular
calcification.

Skull: Normal. Negative for fracture or focal lesion.

Sinuses/Orbits: Mucosal thickening in the ethmoid sinus and
maxillary sinuses.

Other: None
IMPRESSION: 1. No CT evidence for acute intracranial abnormality.
2. Atrophy and mild chronic small vessel ischemic changes of the
white matter.

## 2020-03-01 MED ORDER — SODIUM CHLORIDE 0.9% FLUSH: 3.0000 mL | Freq: Once | INTRAVENOUS | Status: DC

## 2020-03-01 NOTE — ED Triage Notes (Signed)
PT to ED via [facility]. PT states that he is here for "vertigo, vomiting and losing my bowels all over". PT states also that the vein on the top of his foot has opened up and caused him to lose a lot of blood (no blood visualized). PT states episode of vomiting and dizziness happened once six months ago but went away. PT is speaking in full and complete sentences and playing on his phone.

## 2020-03-01 NOTE — ED Triage Notes (Signed)
Pt in via EMS with vertigo for 2 days. Pt is pending eviction at residence and is concerned.
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
## 2020-03-15 ENCOUNTER — Other Ambulatory Visit: Payer: Self-pay

## 2020-03-15 ENCOUNTER — Ambulatory Visit (INDEPENDENT_AMBULATORY_CARE_PROVIDER_SITE_OTHER): Payer: Medicare Other | Admitting: Cardiovascular Disease

## 2020-03-15 ENCOUNTER — Encounter: Payer: Self-pay | Admitting: Cardiovascular Disease

## 2020-03-15 VITALS — BP 150/86 | HR 91 | Ht 65.0 in | Wt 230.0 lb

## 2020-03-15 DIAGNOSIS — I25118 Atherosclerotic heart disease of native coronary artery with other forms of angina pectoris: Secondary | ICD-10-CM | POA: Diagnosis not present

## 2020-03-15 DIAGNOSIS — I5032 Chronic diastolic (congestive) heart failure: Secondary | ICD-10-CM

## 2020-03-15 DIAGNOSIS — F419 Anxiety disorder, unspecified: Secondary | ICD-10-CM

## 2020-03-15 DIAGNOSIS — I4819 Other persistent atrial fibrillation: Secondary | ICD-10-CM | POA: Diagnosis not present

## 2020-03-15 DIAGNOSIS — E119 Type 2 diabetes mellitus without complications: Secondary | ICD-10-CM | POA: Diagnosis not present

## 2020-03-15 DIAGNOSIS — I10 Essential (primary) hypertension: Secondary | ICD-10-CM

## 2020-03-15 NOTE — Patient Instructions (Signed)

## 2020-03-24 ENCOUNTER — Ambulatory Visit: Payer: Medicare Other | Admitting: Nurse Practitioner

## 2020-03-30 ENCOUNTER — Telehealth: Payer: Self-pay

## 2020-03-31 ENCOUNTER — Ambulatory Visit: Payer: Medicare Other | Attending: Family | Admitting: Family

## 2020-03-31 ENCOUNTER — Encounter: Payer: Self-pay | Admitting: Family

## 2020-03-31 ENCOUNTER — Other Ambulatory Visit: Payer: Self-pay

## 2020-03-31 ENCOUNTER — Telehealth: Payer: Self-pay

## 2020-03-31 VITALS — BP 172/96 | HR 64 | Resp 18 | Ht 65.0 in | Wt 229.4 lb

## 2020-03-31 DIAGNOSIS — F419 Anxiety disorder, unspecified: Secondary | ICD-10-CM | POA: Diagnosis not present

## 2020-03-31 DIAGNOSIS — Z7982 Long term (current) use of aspirin: Secondary | ICD-10-CM | POA: Insufficient documentation

## 2020-03-31 DIAGNOSIS — I252 Old myocardial infarction: Secondary | ICD-10-CM | POA: Insufficient documentation

## 2020-03-31 DIAGNOSIS — E1151 Type 2 diabetes mellitus with diabetic peripheral angiopathy without gangrene: Secondary | ICD-10-CM | POA: Diagnosis not present

## 2020-03-31 DIAGNOSIS — Z7984 Long term (current) use of oral hypoglycemic drugs: Secondary | ICD-10-CM | POA: Insufficient documentation

## 2020-03-31 DIAGNOSIS — J449 Chronic obstructive pulmonary disease, unspecified: Secondary | ICD-10-CM | POA: Diagnosis not present

## 2020-03-31 DIAGNOSIS — E785 Hyperlipidemia, unspecified: Secondary | ICD-10-CM | POA: Diagnosis not present

## 2020-03-31 DIAGNOSIS — Z882 Allergy status to sulfonamides status: Secondary | ICD-10-CM | POA: Diagnosis not present

## 2020-03-31 DIAGNOSIS — I10 Essential (primary) hypertension: Secondary | ICD-10-CM

## 2020-03-31 DIAGNOSIS — Z8249 Family history of ischemic heart disease and other diseases of the circulatory system: Secondary | ICD-10-CM | POA: Diagnosis not present

## 2020-03-31 DIAGNOSIS — Z7901 Long term (current) use of anticoagulants: Secondary | ICD-10-CM | POA: Diagnosis not present

## 2020-03-31 DIAGNOSIS — Z880 Allergy status to penicillin: Secondary | ICD-10-CM | POA: Diagnosis not present

## 2020-03-31 DIAGNOSIS — Z955 Presence of coronary angioplasty implant and graft: Secondary | ICD-10-CM | POA: Insufficient documentation

## 2020-03-31 DIAGNOSIS — I2510 Atherosclerotic heart disease of native coronary artery without angina pectoris: Secondary | ICD-10-CM | POA: Diagnosis not present

## 2020-03-31 DIAGNOSIS — I110 Hypertensive heart disease with heart failure: Secondary | ICD-10-CM | POA: Diagnosis not present

## 2020-03-31 DIAGNOSIS — I5032 Chronic diastolic (congestive) heart failure: Secondary | ICD-10-CM | POA: Diagnosis not present

## 2020-03-31 DIAGNOSIS — G4733 Obstructive sleep apnea (adult) (pediatric): Secondary | ICD-10-CM | POA: Insufficient documentation

## 2020-03-31 DIAGNOSIS — Z79899 Other long term (current) drug therapy: Secondary | ICD-10-CM | POA: Insufficient documentation

## 2020-03-31 DIAGNOSIS — Z951 Presence of aortocoronary bypass graft: Secondary | ICD-10-CM | POA: Insufficient documentation

## 2020-03-31 DIAGNOSIS — K219 Gastro-esophageal reflux disease without esophagitis: Secondary | ICD-10-CM | POA: Insufficient documentation

## 2020-03-31 DIAGNOSIS — Z8349 Family history of other endocrine, nutritional and metabolic diseases: Secondary | ICD-10-CM | POA: Insufficient documentation

## 2020-03-31 DIAGNOSIS — E669 Obesity, unspecified: Secondary | ICD-10-CM | POA: Diagnosis not present

## 2020-03-31 NOTE — Progress Notes (Signed)
Patient ID: Evan [name], male    DOB: 01-28-1956, 64 y.o.   MRN: [id number]  HPI  Evan [name] is a 64 y/o male with a history of asthma, CAD, DM, hyperlipidemia, HTN, GERD, obstructive sleep apnea, COPD, MI, anxiety, panic disorder, PTSA and chronic heart failure.   Echo report from 08/28/19 reviewed and showed an EF of 50-55% along with trivial Evan/TR and normal PA Pressure. Echo done 12/29/13 which showed an EF of 50-55%.   Was in the ED 10/27/19 due to indigestion and leg pain where he was treated and released.    He presents today for a follow-up visit with a chief complaint of minimal fatigue upon moderate exertion. He describes this as chronic in nature having been present for several years. He has associated light-headedness, pedal edema, anxiety and difficulty sleeping along with this. He denies any abdominal distention, palpitations, chest pain, shortness of breath, cough or weight gain.   A majority of the visit was spent listening to him talk about his conversations with various country music stars and showing me pictures and videos of him talking to them.   Past Medical History:  Diagnosis Date  . Anginal pain ([place])   . Anxiety disorder   . Asthma   . Atresia of esophagus without fistula   . CAD (coronary artery disease)   . Cellulitis   . CHF (congestive heart failure) ([place])    NYHA CLASS III,CHRONIC,DIASTOLIC  . COPD (chronic obstructive pulmonary disease) ([place])   . Diabetes mellitus without complication ([place])   . Edema    RIGHT LOWER LEG  . Gastroesophageal reflux   . H/O: GI bleed   . History of pneumonia    Remote  . History of scarlet fever    Childhood  . Hyperlipidemia   . Hypertension   . Myocardial infarction ([place]) 2009  . Obesity   . Obstructive sleep apnea   . Pain    CHRONIC BACK / ABDOMINAL  . Panic disorder   . Peripheral venous insufficiency   . PTSD (post-traumatic stress disorder)   . Retinopathy    DIABETIC  . Stasis, venous   . Vertigo     Past Surgical History:  Procedure Laterality Date  . CARDIAC CATHETERIZATION    . CATARACT EXTRACTION Left   . CATARACT EXTRACTION W/PHACO Right 05/03/2017   Procedure: CATARACT EXTRACTION PHACO AND INTRAOCULAR LENS PLACEMENT (IOC);  Surgeon: Leandrew Koyanagi, MD;  Location: [facility] ORS;  Service: Ophthalmology;  Laterality: Right;  [place] 00:35.3 AP% 12.3 CDE 4.33 Fluid Pack lot # [id number] H       . CORONARY ANGIOPLASTY WITH STENT PLACEMENT  2002  . CORONARY ANGIOPLASTY WITH STENT PLACEMENT  1999  . CORONARY ARTERY BYPASS GRAFT     x7  . ESOPHAGOGASTRODUODENOSCOPY N/A 09/19/2016   Procedure: ESOPHAGOGASTRODUODENOSCOPY (EGD);  Surgeon: Lollie Sails, MD;  Location: [facility] ENDOSCOPY;  Service: Endoscopy;  Laterality: N/A;   Family History  Problem Relation Age of Onset  . Heart attack Mother   . Hypertension Mother   . Hyperlipidemia Mother   . Heart attack Brother 82       MI  . Coronary artery disease Other    Social History   Tobacco Use  . Smoking status: Never Smoker  . Smokeless tobacco: Never Used  Substance Use Topics  . Alcohol use: No   Allergies  Allergen Reactions  . Penicillin G Hives  . Sulfa Antibiotics Hives  . Tiotropium   . Zoloft [Sertraline Hcl] Other (  See Comments)   Prior to Admission medications   Medication Sig Start Date End Date Taking? Authorizing Provider  acetaminophen (TYLENOL) 500 MG tablet Take 500 mg by mouth every 6 (six) hours as needed.   Yes [provider]  aspirin 81 MG chewable tablet Chew 81 mg by mouth daily.   Yes [provider]  cholecalciferol (VITAMIN D3) 25 MCG (1000 UNIT) tablet Take 1 tablet (1,000 Units total) by mouth daily. 09/09/19  Yes [name], [name] T, NP  empagliflozin (JARDIANCE) 10 MG TABS tablet Take 10 mg by mouth daily. 10/10/19  Yes [name], [name] T, NP  fluticasone (FLONASE) 50 MCG/ACT nasal spray Place 2 sprays into both nostrils daily.  12/31/13  Yes [provider]  furosemide  (LASIX) 40 MG tablet Take 1 tablet (40 mg total) by mouth 2 (two) times daily. Take 1 tablet (40 mg) by mouth twice a day 10/10/19  Yes [name], [name] T, NP  lisinopril (ZESTRIL) 10 MG tablet Take 10 mg by mouth daily. 11/28/19  Yes [provider]  metoprolol succinate (TOPROL XL) 50 MG 24 hr tablet Take 1 tablet (50 mg total) by mouth daily. 10/10/19  Yes [name], [name] T, NP  nitroGLYCERIN (NITROSTAT) 0.4 MG SL tablet Place 1 tablet (0.4 mg total) under the tongue every 5 (five) minutes as needed for chest pain. 10/10/19  Yes [name], [name] T, NP  omeprazole (PRILOSEC) 20 MG capsule Take 1 capsule (20 mg total) by mouth daily. 10/10/19  Yes [name], [name] T, NP  risperiDONE (RISPERDAL) 0.5 MG tablet Take 1 tablet (0.5 mg total) by mouth 2 (two) times daily. 10/10/19  Yes [name], [name] T, NP  rivaroxaban (XARELTO) 20 MG TABS tablet Take 1 tablet (20 mg total) by mouth at bedtime. 10/10/19  Yes [name], [name] T, NP  rosuvastatin (CRESTOR) 40 MG tablet Take 40 mg by mouth daily. 11/28/19  Yes [provider]  ipratropium-albuterol (DUONEB) 0.5-2.5 (3) MG/3ML SOLN Take 3 mLs by nebulization every 4 (four) hours as needed. Patient not taking: Reported on 03/31/2020    [provider]     Review of Systems  Constitutional: Positive for fatigue. Negative for appetite change.  HENT: Positive for congestion and rhinorrhea. Negative for sore throat.   Eyes: Negative.   Respiratory: Negative for cough and shortness of breath.   Cardiovascular: Positive for leg swelling. Negative for chest pain and palpitations.  Gastrointestinal: Negative for abdominal distention and abdominal pain.       Reflux  Endocrine: Negative.   Genitourinary: Negative.   Musculoskeletal: Positive for arthralgias (right knee pain).  Skin: Negative.   Allergic/Immunologic: Negative.   Neurological: Positive for light-headedness (at times). Negative for dizziness.  Hematological: Negative for adenopathy.  Does not bruise/bleed easily.  Psychiatric/Behavioral: Positive for sleep disturbance (not sleeping well; sleeping on 1 pillow and wedge). Negative for dysphoric mood. The patient is nervous/anxious.    Vitals:   03/31/20 1204  BP: (!) 172/96  Pulse: 64  Resp: 18  SpO2: 98%  Weight: 229 lb 6 oz (104 kg)  Height: 5\' 5"  (1.651 m)   Wt Readings from Last 3 Encounters:  03/31/20 229 lb 6 oz (104 kg)  03/15/20 230 lb (104.3 kg)  03/01/20 (!) 235 lb (106.6 kg)   Lab Results  Component Value Date   CREATININE 0.83 03/01/2020   CREATININE 0.83 12/10/2019   CREATININE 0.83 10/27/2019    Physical Exam Vitals and nursing note reviewed.  Constitutional:      Appearance: Normal  appearance.  HENT:     Head: Normocephalic and atraumatic.  Cardiovascular:     Rate and Rhythm: Normal rate and regular rhythm.  Pulmonary:     Effort: Pulmonary effort is normal. No respiratory distress.     Breath sounds: No wheezing or rales.  Abdominal:     General: There is no distension.     Palpations: Abdomen is soft.     Tenderness: There is no abdominal tenderness.  Musculoskeletal:        General: No tenderness.     Cervical back: Normal range of motion and neck supple.     Right lower leg: Edema (trace pitting) present.     Left lower leg: Edema (trace pitting) present.  Skin:    General: Skin is warm and dry.  Neurological:     General: No focal deficit present.     Mental Status: He is alert and oriented to person, place, and time.  Psychiatric:        Mood and Affect: Mood is anxious.        Behavior: Behavior normal.    Assessment & Plan:  1: Chronic heart failure with preserved ejection fraction- - NYHA class II - euvolemic today - weighing daily and says that his weight has been stable; reminded to call for an overnight weight gain of >2 pounds or a weekly weight gain of >5 pounds - weight down 7 pounds from last visit here 6 months ago - not adding salt and trying to follow a  low sodium diet - saw cardiology [name]) 09/16/19 - BNP 04/01/15 was 282.0 - patient reports receiving both covid vaccines  2: HTN- - BP elevated today but he hasn't taken his medications yet today - saw PCP Ned Card) 12/10/19 - BMP 03/01/20 reviewed and showed sodium 137, potassium 4.0, creatinine 0.83 and GFR >60  3: Anxiety- - patient talking fast about his various conversations with different [place] music stars and that they consider him a super-fan and have been checking on him - listened to patient talk and show me his pictures and videos - currently on risperidone BID   Medication packets were reviewed.   Due to HF stability will not make a return appointment at this time. Advised patient that he could call back at anytime to schedule another appointment and patient was comfortable with this plan.

## 2020-03-31 NOTE — Patient Instructions (Addendum)
Continue weighing daily and call for an overnight weight gain of > 2 pounds or a weekly weight gain of >5 pounds.   Call us in the future if you'd like to schedule another appointment 

## 2020-04-02 ENCOUNTER — Ambulatory Visit: Payer: Self-pay | Admitting: Licensed Clinical Social Worker

## 2020-04-02 NOTE — Chronic Care Management (AMB) (Signed)
  Care Management   Follow Up Note   04/02/2020 Name: Evan [name] MRN: [id number] DOB: February 18, 1956  Referred by: [name], NP Reason for referral : [place] is a 64 y.o. year old male who is a primary care patient of [name], [name], NP. The care management team was consulted for assistance with care management and care coordination needs.    Review of patient status, including review of consultants reports, relevant laboratory and other test results, and collaboration with appropriate care team members and the patient's provider was performed as part of comprehensive patient evaluation and provision of chronic care management services.    LCSW received missed call and voice message from patient on 04/01/20. LCSW completed CCM outreach attempt today but was unable to reach patient successfully. A HIPPA compliant voice message was left encouraging patient to return call once available. LCSW has scheduled CCM social work appointment with patient next week on 04/09/20.   A HIPPA compliant phone message was left for the patient providing contact information and requesting a return call.   [name], BSW, MSW, [facility] Care Management [email or web address] Phone: [phone]

## 2020-04-05 ENCOUNTER — Telehealth: Payer: Self-pay

## 2020-04-08 ENCOUNTER — Encounter: Payer: Self-pay | Admitting: Nurse Practitioner

## 2020-04-08 ENCOUNTER — Other Ambulatory Visit: Payer: Self-pay

## 2020-04-08 ENCOUNTER — Ambulatory Visit (INDEPENDENT_AMBULATORY_CARE_PROVIDER_SITE_OTHER): Payer: Medicare Other | Admitting: Nurse Practitioner

## 2020-04-08 VITALS — BP 128/86 | HR 98 | Temp 98.1°F | Wt 233.0 lb

## 2020-04-08 DIAGNOSIS — Z23 Encounter for immunization: Secondary | ICD-10-CM

## 2020-04-08 DIAGNOSIS — R809 Proteinuria, unspecified: Secondary | ICD-10-CM | POA: Diagnosis not present

## 2020-04-08 DIAGNOSIS — I4819 Other persistent atrial fibrillation: Secondary | ICD-10-CM

## 2020-04-08 DIAGNOSIS — I10 Essential (primary) hypertension: Secondary | ICD-10-CM

## 2020-04-08 DIAGNOSIS — F419 Anxiety disorder, unspecified: Secondary | ICD-10-CM

## 2020-04-08 DIAGNOSIS — E1169 Type 2 diabetes mellitus with other specified complication: Secondary | ICD-10-CM | POA: Diagnosis not present

## 2020-04-08 DIAGNOSIS — E1129 Type 2 diabetes mellitus with other diabetic kidney complication: Secondary | ICD-10-CM | POA: Diagnosis not present

## 2020-04-08 DIAGNOSIS — I152 Hypertension secondary to endocrine disorders: Secondary | ICD-10-CM

## 2020-04-08 DIAGNOSIS — E1159 Type 2 diabetes mellitus with other circulatory complications: Secondary | ICD-10-CM

## 2020-04-08 DIAGNOSIS — G4733 Obstructive sleep apnea (adult) (pediatric): Secondary | ICD-10-CM

## 2020-04-08 DIAGNOSIS — I5032 Chronic diastolic (congestive) heart failure: Secondary | ICD-10-CM | POA: Diagnosis not present

## 2020-04-08 DIAGNOSIS — J449 Chronic obstructive pulmonary disease, unspecified: Secondary | ICD-10-CM

## 2020-04-08 DIAGNOSIS — E785 Hyperlipidemia, unspecified: Secondary | ICD-10-CM | POA: Diagnosis not present

## 2020-04-08 DIAGNOSIS — Z8546 Personal history of malignant neoplasm of prostate: Secondary | ICD-10-CM

## 2020-04-08 DIAGNOSIS — I700 Atherosclerosis of aorta: Secondary | ICD-10-CM

## 2020-04-08 DIAGNOSIS — Z9989 Dependence on other enabling machines and devices: Secondary | ICD-10-CM

## 2020-04-08 DIAGNOSIS — K224 Dyskinesia of esophagus: Secondary | ICD-10-CM

## 2020-04-08 LAB — BAYER DCA HB A1C WAIVED: HB A1C (BAYER DCA - WAIVED): 6.2 % (ref <7.0)

## 2020-04-08 MED ORDER — PANTOPRAZOLE SODIUM 40 MG PO TBEC: 40.0000 mg | DELAYED_RELEASE_TABLET | Freq: Every day | ORAL | 4 refills | Status: DC

## 2020-04-08 NOTE — Assessment & Plan Note (Signed)
Continue collaboration with oncology and urology.  PSA up to date at recent oncology visit. 

## 2020-04-08 NOTE — Assessment & Plan Note (Signed)
Chronic, ongoing.  Continue current medication regimen and adjust as needed.  Return in 3 months for visit.  Lipid panel today.

## 2020-04-08 NOTE — Patient Instructions (Signed)

## 2020-04-08 NOTE — Assessment & Plan Note (Signed)
Chronic, ongoing.  Continue collaboration with cardiology and current medication regimen, including Metoprolol and Xarelto.  BMP today. 

## 2020-04-08 NOTE — Assessment & Plan Note (Signed)
Recommend consistent use of CPAP daily, he reports using every night.

## 2020-04-08 NOTE — Progress Notes (Signed)
BP 128/86 (BP Location: Left Arm)    Pulse 98    Temp 98.1 F (36.7 C) (Oral)    Wt 233 lb (105.7 kg)    SpO2 98%    BMI 38.77 kg/m    Subjective:    Patient ID: Evan [name], male    DOB: 1956-05-27, 64 y.o.   MRN: [id number]  HPI: Evan [name] is a 64 y.o. male  Chief Complaint  Patient presents with   Diabetes   Hyperlipidemia   Hypertension   DIABETES Continues on Jardiance 10 MG daily. A1C in February 6.3% May. Hypoglycemic episodes:no Polydipsia/polyuria:no Visual disturbance:no Chest pain:no Paresthesias:no Glucose Monitoring:no Accucheck frequency: Not Checking Fasting glucose: Post prandial: Evening: Before meals: Taking Insulin?:no Long acting insulin: Short acting insulin: Blood Pressure Monitoring:not checking Retinal Examination:Up to Date Foot Exam:Up to Date Pneumovax:unknown Influenza:unknown Aspirin:yes  HYPERTENSION / HYPERLIPIDEMIA Saw HF Clinic on 10/01/19, no changes made. Saw cardiology on 03/15/20 with no changes made and saw NP [facility] with HF clinic last 03/31/20. Recent EF 08/28/2019 was 50 to 55%. Uses CPAP at home about 100% of the time.  No recent NTG use.  Has history of prostate cancer and did attend follow-up with Dr. Baruch Gouty on 02/12/20.  PSA at visit 0.05. Satisfied with current treatment?yes Duration of hypertension:chronic BP monitoring frequency:not checking BP range:  BP medication side effects:no Duration of hyperlipidemia:chronic Cholesterol medication side effects:no Cholesterol supplements: none Medication compliance:good compliance Aspirin:yes Recent stressors:no Recurrent headaches:no Visual changes:no Palpitations:no Dyspnea:no Chest pain:no Lower extremity edema:no Dizzy/lightheaded:no  GERD Has been followed by GI in past for dysphagia, last visit with EGD November 2017.  Previously  took Zantac, but was changed to Prilosec which he takes 20 MG daily.  Reports poor control.  Has underlying mild COPD with no current inhalers. GERD control status: uncontrolled  Satisfied with current treatment? no Heartburn frequency: 1-2 times a day Medication side effects: no  Medication compliance: stable Previous GERD medications: Zantac Antacid use frequency:  1-2 times a day Duration: since 2017 Nature: burning Location: epigastric Heartburn duration: 1 hour Alleviatiating factors:  TUMS sometimes Aggravating factors: certain foods Dysphagia: no Odynophagia:  no Hematemesis: no Blood in stool: no EGD: yes   ANXIETY/STRESS Continues on Seroquel for mood, which he reports is helpful.  When [facility] there was concern he did not take medication consistently, noted during nursing visits on review of notes, there continues to be concern about this, although has pill packs now.  He brought pill packs today and he has taken all scheduled slots for this month so far.  He continues to refuse referral to psychiatry and continues to discuss his friendships with multiple different famous people -- Lemmie Evens and Thayer Headings.   Duration:stable Anxious mood:no Excessive worrying:no Irritability:no Sweating:no Nausea:no Palpitations:no Hyperventilation:no Panic attacks:no Agoraphobia:no Obscessions/compulsions:yes Depressed mood:no Depression screen [facility] 2/9 04/08/2020 02/16/2020 12/10/2019 11/05/2019 10/07/2019  Decreased Interest 0 0 0 0 0  Down, Depressed, Hopeless 0 0 0 0 0  PHQ - 2 Score 0 0 0 0 0  Altered sleeping 0 - 0 0 0  Tired, decreased energy 0 - 0 0 0  Change in appetite 0 - 0 0 0  Feeling bad or failure about yourself  0 - 0 0 0  Trouble concentrating 0 - 0 0 0  Moving slowly or fidgety/restless 0 - 0 0 0  Suicidal thoughts 0 - 0 0 0  PHQ-9 Score 0 - 0 0 0  Difficult doing work/chores  Not difficult at all - - Not difficult at all Not  difficult at all   GAD 7 : Generalized Anxiety Score 04/08/2020 03/31/2020 12/10/2019 11/05/2019  Nervous, Anxious, on Edge 0 0 1 1  Control/stop worrying 1 0 1 0  Worry too much - different things 1 0 1 1  Trouble relaxing 0 0 0 1  Restless 0 0 0 0  Easily annoyed or irritable 0 0 1 0  Afraid - awful might happen 0 0 0 0  Total GAD 7 Score 2 0 4 3  Anxiety Difficulty Not difficult at all Not difficult at all Somewhat difficult Not difficult at all     Relevant past medical, surgical, family and social history reviewed and updated as indicated. Interim medical history since our last visit reviewed. Allergies and medications reviewed and updated.  Review of Systems  Constitutional: Negative for activity change, diaphoresis, fatigue and fever.  Respiratory: Negative for cough, chest tightness, shortness of breath and wheezing.   Cardiovascular: Positive for leg swelling (baseline per his report). Negative for chest pain and palpitations.  Gastrointestinal: Negative for abdominal distention, abdominal pain, constipation, diarrhea, nausea and vomiting.  Endocrine: Negative for cold intolerance, heat intolerance, polydipsia, polyphagia and polyuria.  Neurological: Negative for dizziness, syncope, weakness, light-headedness, numbness and headaches.  Psychiatric/Behavioral: Negative for decreased concentration, self-injury, sleep disturbance and suicidal ideas. The patient is nervous/anxious.     Per HPI unless specifically indicated above     Objective:    BP 128/86 (BP Location: Left Arm)    Pulse 98    Temp 98.1 F (36.7 C) (Oral)    Wt 233 lb (105.7 kg)    SpO2 98%    BMI 38.77 kg/m   Wt Readings from Last 3 Encounters:  04/08/20 233 lb (105.7 kg)  03/31/20 229 lb 6 oz (104 kg)  03/15/20 230 lb (104.3 kg)    Physical Exam Vitals and nursing note reviewed.  Constitutional:      General: He is awake. He is not in acute distress.    Appearance: He is well-developed. He is morbidly  obese. He is not ill-appearing.  HENT:     Head: Normocephalic and atraumatic.     Right Ear: Hearing normal. No drainage.     Left Ear: Hearing normal. No drainage.  Eyes:     General: Lids are normal.        Right eye: No discharge.        Left eye: No discharge.     Pupils: Pupils are equal, round, and reactive to light.  Neck:     Thyroid: No thyromegaly.     Vascular: No carotid bruit or JVD.     Trachea: Trachea normal.  Cardiovascular:     Rate and Rhythm: Normal rate and regular rhythm.     Heart sounds: Normal heart sounds, S1 normal and S2 normal. No murmur heard.  No gallop.   Pulmonary:     Effort: Pulmonary effort is normal. No accessory muscle usage or respiratory distress.     Breath sounds: Normal breath sounds.  Abdominal:     General: Bowel sounds are normal.     Palpations: Abdomen is soft.     Tenderness: There is no abdominal tenderness.  Musculoskeletal:        General: Normal range of motion.     Cervical back: Normal range of motion and neck supple.     Right lower leg: Edema (trace) present.     Left  lower leg: Edema (trace) present.  Lymphadenopathy:     Cervical: No cervical adenopathy.  Skin:    General: Skin is warm and dry.     Capillary Refill: Capillary refill takes less than 2 seconds.  Neurological:     Mental Status: He is alert and oriented to person, place, and time.     Gait: Gait is intact.  Psychiatric:        Attention and Perception: Attention normal.        Mood and Affect: Mood normal.        Speech: Speech normal.        Behavior: Behavior normal. Behavior is cooperative.     Comments: Tangential thought processes.  Talked about his friendships with Reather Laurence, [name], and [place].  This is baseline for him.     Results for orders placed or performed during the hospital encounter of 03/01/20  Lipase, blood  Result Value Ref Range   Lipase 27 11 - 51 U/L  Comprehensive metabolic panel  Result Value Ref  Range   Sodium 137 135 - 145 mmol/L   Potassium 4.0 3.5 - 5.1 mmol/L   Chloride 104 98 - 111 mmol/L   CO2 22 22 - 32 mmol/L   Glucose, Bld 117 (H) 70 - 99 mg/dL   BUN 15 8 - 23 mg/dL   Creatinine, Ser 0.83 0.61 - 1.24 mg/dL   Calcium 9.2 8.9 - 10.3 mg/dL   Total Protein 7.7 6.5 - 8.1 g/dL   Albumin 4.1 3.5 - 5.0 g/dL   AST 16 15 - 41 U/L   ALT 14 0 - 44 U/L   Alkaline Phosphatase 85 38 - 126 U/L   Total Bilirubin 0.9 0.3 - 1.2 mg/dL   GFR calc non Af Amer >60 >60 mL/min   GFR calc Af Amer >60 >60 mL/min   Anion gap 11 5 - 15  CBC  Result Value Ref Range   WBC 10.0 4.0 - 10.5 K/uL   RBC 5.04 4.22 - 5.81 MIL/uL   Hemoglobin 13.9 13.0 - 17.0 g/dL   HCT 42.5 39 - 52 %   MCV 84.3 80.0 - 100.0 fL   MCH 27.6 26.0 - 34.0 pg   MCHC 32.7 30.0 - 36.0 g/dL   RDW 16.1 (H) 11.5 - 15.5 %   Platelets 305 150 - 400 K/uL   nRBC 0.0 0.0 - 0.2 %      Assessment & Plan:   Problem List Items Addressed This Visit      Cardiovascular and Mediastinum   Hypertension associated with diabetes (HCC)    Chronic, ongoing.  BP initially elevated, but repeat improved, baseline.  Recommend checking these at home a few mornings a week and documenting + focus on DASH diet.  Continue current medication regimen and adjust as needed + collaboration with cardiology. BMP today. Return in 3 months.      Chronic diastolic CHF (congestive heart failure) (HCC)    Chronic, ongoing. Continue collaboration with cardiology and HF clinic. Recommend he take medication consistently. Recommend: - Reminded to call for an overnight weight gain of >2 pounds or a weekly weight weight of >5 pounds - not adding salt to his food and has been reading food labels. Reviewed the importance of keeping daily sodium intake to 2000mg  daily  - Return in 2 months for visit and labs.      Persistent atrial fibrillation (HCC)    Chronic, ongoing.  Continue collaboration with cardiology  and current medication regimen, including  Metoprolol and Xarelto.  BMP today.      Atherosclerosis of aorta ([place])    Noted on CT 10/29/17.  Continue Crestor and Xarelto which offer preventative.  Recommend he consistently take his medication daily, currently using pill packs.        Respiratory   Obstructive sleep apnea on CPAP    Recommend consistent use of CPAP daily, he reports using every night.      COPD, mild (HCC)    Chronic, stable.  No current inhalers.  Plan on spirometry at physical in new year.  Initiate inhalers as needed.        Digestive   Esophageal dysmotility    Ongoing GERD symptoms reported.  Will discontinue Omeprazole and start Protonix 40 MG daily, discussed with patient and educated him.  Check Mag level next visit.  Consider return to GI if ongoing symptoms.          Endocrine   Type 2 diabetes mellitus with proteinuria (HCC) - Primary    Chronic, ongoing with A1C below goal at 6.2% and urine ALB 80 and A:C 30-300 at last visit.  Continue Lisinopril for kidney protection.  Continue current Jardiance, benefit with his HF.  Check BMP today.  Recommend he check BS at least 3 mornings a week at home.  Return to office in 3 months.  Continue pill packs for consistent medication adherence.      Relevant Orders   Basic metabolic panel   Bayer DCA Hb A1c Waived   Hyperlipidemia associated with type 2 diabetes mellitus (HCC)    Chronic, ongoing.  Continue current medication regimen and adjust as needed.  Return in 3 months for visit.  Lipid panel today.      Relevant Orders   Bayer DCA Hb A1c Waived   Lipid Panel w/o Chol/HDL Ratio     Other   Obesity, morbid (more than 100 lbs over ideal weight or BMI > 40) (HCC)    BMI 38.77 with T2DM, HTN.  Recommended eating smaller high protein, low fat meals more frequently and exercising 30 mins a day 5 times a week with a goal of 10-15lb weight loss in the next 3 months. Patient voiced their understanding and motivation to adhere to these recommendations.        Anxiety    Chronic, ongoing with some grandiose thought processes present, possible schizophrenia on presentation.  Continue current medication regimen and adjust as needed.  Has history on review old records of missing doses Seroquel, will continue current medication at this time and adjust as needed.  Continue collaboration with CCM SW.  Would benefit from psychiatry and therapy, but he refuses these.  Will continue to encourage this and discuss with him.      History of prostate cancer    Continue collaboration with oncology and urology.  PSA up to date at recent oncology visit.       Other Visit Diagnoses    Need for influenza vaccination       Relevant Orders   Flu Vaccine QUAD High Dose(Fluad) (Completed)       Follow up plan: Return in about 3 months (around 07/08/2020) for T2DM, HTN/HLD, GERD, MOOD.

## 2020-04-08 NOTE — Assessment & Plan Note (Signed)
Chronic, ongoing with A1C below goal at 6.2% and urine ALB 80 and A:C 30-300 at last visit.  Continue Lisinopril for kidney protection.  Continue current Jardiance, benefit with his HF.  Check BMP today.  Recommend he check BS at least 3 mornings a week at home.  Return to office in 3 months.  Continue pill packs for consistent medication adherence.

## 2020-04-08 NOTE — Assessment & Plan Note (Signed)
Chronic, ongoing. Continue collaboration with cardiology and HF clinic. Recommend he take medication consistently. Recommend: - Reminded to call for an overnight weight gain of >2 pounds or a weekly weight weight of >5 pounds - not adding salt to his food and has been reading food labels. Reviewed the importance of keeping daily sodium intake to <2000mg daily  - Return in 2 months for visit and labs. 

## 2020-04-08 NOTE — Assessment & Plan Note (Signed)
Noted on CT 10/29/17.  Continue Crestor and Xarelto which offer preventative.  Recommend he consistently take his medication daily, currently using pill packs.

## 2020-04-08 NOTE — Assessment & Plan Note (Signed)
BMI 38.77 with T2DM, HTN.  Recommended eating smaller high protein, low fat meals more frequently and exercising 30 mins a day 5 times a week with a goal of 10-15lb weight loss in the next 3 months. Patient voiced their understanding and motivation to adhere to these recommendations.

## 2020-04-08 NOTE — Assessment & Plan Note (Addendum)
Chronic, ongoing.  BP initially elevated, but repeat improved, baseline.  Recommend checking these at home a few mornings a week and documenting + focus on DASH diet.  Continue current medication regimen and adjust as needed + collaboration with cardiology. BMP today. Return in 3 months. 

## 2020-04-08 NOTE — Assessment & Plan Note (Signed)
Ongoing GERD symptoms reported.  Will discontinue Omeprazole and start Protonix 40 MG daily, discussed with patient and educated him.  Check Mag level next visit.  Consider return to GI if ongoing symptoms.

## 2020-04-08 NOTE — Assessment & Plan Note (Signed)
Chronic, stable.  No current inhalers.  Plan on spirometry at physical in new year.  Initiate inhalers as needed. 

## 2020-04-08 NOTE — Assessment & Plan Note (Signed)
Chronic, ongoing with some grandiose thought processes present, possible schizophrenia on presentation.  Continue current medication regimen and adjust as needed.  Has history on review old records of missing doses Seroquel, will continue current medication at this time and adjust as needed.  Continue collaboration with CCM SW.  Would benefit from psychiatry and therapy, but he refuses these.  Will continue to encourage this and discuss with him.

## 2020-04-09 ENCOUNTER — Ambulatory Visit (INDEPENDENT_AMBULATORY_CARE_PROVIDER_SITE_OTHER): Payer: Medicare Other | Admitting: Licensed Clinical Social Worker

## 2020-04-09 DIAGNOSIS — E1129 Type 2 diabetes mellitus with other diabetic kidney complication: Secondary | ICD-10-CM

## 2020-04-09 DIAGNOSIS — R809 Proteinuria, unspecified: Secondary | ICD-10-CM

## 2020-04-09 DIAGNOSIS — J449 Chronic obstructive pulmonary disease, unspecified: Secondary | ICD-10-CM

## 2020-04-09 DIAGNOSIS — F423 Hoarding disorder: Secondary | ICD-10-CM

## 2020-04-09 DIAGNOSIS — I152 Hypertension secondary to endocrine disorders: Secondary | ICD-10-CM

## 2020-04-09 DIAGNOSIS — I5032 Chronic diastolic (congestive) heart failure: Secondary | ICD-10-CM

## 2020-04-09 LAB — LIPID PANEL W/O CHOL/HDL RATIO
Cholesterol, Total: 247 mg/dL — ABNORMAL HIGH (ref 100–199)
HDL: 46 mg/dL (ref >39)
LDL Chol Calc (NIH): 171 mg/dL — ABNORMAL HIGH (ref 0–99)
Triglycerides: 164 mg/dL — ABNORMAL HIGH (ref 0–149)
VLDL Cholesterol Cal: 30 mg/dL (ref 5–40)

## 2020-04-09 LAB — BASIC METABOLIC PANEL
BUN/Creatinine Ratio: 20 (ref 10–24)
BUN: 17 mg/dL (ref 8–27)
CO2: 21 mmol/L (ref 20–29)
Calcium: 9.6 mg/dL (ref 8.6–10.2)
Chloride: 102 mmol/L (ref 96–106)
Creatinine, Ser: 0.84 mg/dL (ref 0.76–1.27)
GFR calc Af Amer: 106 mL/min/{1.73_m2} (ref >59)
GFR calc non Af Amer: 92 mL/min/{1.73_m2} (ref >59)
Glucose: 127 mg/dL — ABNORMAL HIGH (ref 65–99)
Potassium: 5.3 mmol/L — ABNORMAL HIGH (ref 3.5–5.2)
Sodium: 138 mmol/L (ref 134–144)

## 2020-04-09 NOTE — Progress Notes (Signed)
Contacted via [place] afternoon Nikita, your labs have returned.  Potassium level is a little elevated I would like you to cut back on high potassium foods like bananas, potatoes, spinach, mangoes and drink more water at home.  Will recheck next visit.  Cholesterol levels remain elevated.  Are you taking your rosuvastatin daily?  Please ensure you do to help lower these and prevent stroke.  Any questions? Keep being awesome!!  Thank you for allowing me to participate in your care. Kindest regards, Frantz Quattrone

## 2020-04-09 NOTE — Chronic Care Management (AMB) (Signed)
Chronic Care Management    Clinical Social Work Follow Up Note  04/09/2020 Name: GUSS FARRUGGIA MRN: [id number] DOB: Aug 26, 1955  [name] Kinnamon is a 64 y.o. year old male who is a primary care patient of [name], [name], NP. The CCM team was consulted for assistance with [employer]  and Level of Care Concerns.   Review of patient status, including review of consultants reports, other relevant assessments, and collaboration with appropriate care team members and the patient's provider was performed as part of comprehensive patient evaluation and provision of chronic care management services.    SDOH (Social Determinants of Health) assessments performed: Yes    Outpatient Encounter Medications as of 04/09/2020  Medication Sig  . acetaminophen (TYLENOL) 500 MG tablet Take 500 mg by mouth every 6 (six) hours as needed.  Marland Kitchen aspirin 81 MG chewable tablet Chew 81 mg by mouth daily.  . cholecalciferol (VITAMIN D3) 25 MCG (1000 UNIT) tablet Take 1 tablet (1,000 Units total) by mouth daily.  . empagliflozin (JARDIANCE) 10 MG TABS tablet Take 10 mg by mouth daily.  . fluticasone (FLONASE) 50 MCG/ACT nasal spray Place 2 sprays into both nostrils daily.   . furosemide (LASIX) 40 MG tablet Take 1 tablet (40 mg total) by mouth 2 (two) times daily. Take 1 tablet (40 mg) by mouth twice a day (Patient taking differently: Take 40 mg by mouth 2 (two) times daily. Take 2 tablet (40 mg) by mouth in the morning and 1 tablet every evening)  . lisinopril (ZESTRIL) 10 MG tablet Take 10 mg by mouth daily.  . metoprolol succinate (TOPROL XL) 50 MG 24 hr tablet Take 1 tablet (50 mg total) by mouth daily.  . nitroGLYCERIN (NITROSTAT) 0.4 MG SL tablet Place 1 tablet (0.4 mg total) under the tongue every 5 (five) minutes as needed for chest pain.  . pantoprazole (PROTONIX) 40 MG tablet Take 1 tablet (40 mg total) by mouth daily.  . risperiDONE (RISPERDAL) 0.5 MG tablet Take 1 tablet (0.5 mg total) by mouth 2  (two) times daily.  . rivaroxaban (XARELTO) 20 MG TABS tablet Take 1 tablet (20 mg total) by mouth at bedtime.  . rosuvastatin (CRESTOR) 40 MG tablet Take 40 mg by mouth daily.   No facility-administered encounter medications on file as of 04/09/2020.     Goals Addressed    .  SW-"I need more help." (pt-stated)        Current Barriers:  . Financial constraints related to managing health care . Limited social support . ADL IADL limitations . Social Isolation . Limited access to caregiver . Inability to perform ADL's independently . Inability to perform IADL's independently . Patient's current residence was put at risk due to hoarding through [place]):  Marland Kitchen Over the next 120 days, patient will work with SW to address concerns related to lack of support within the home . Over the next 120 days, patient will demonstrate improved health management independence as evidenced by implementing appropriate self-care into daily routine    Interventions: . Patient interviewed and appropriate assessments performed-new referral . Provided patient with information about level of care options, personal care service resources and Medicaid benefits.  . Patient reports that he is managing his health conditions well and is trying to make better healthy eating choices.  . Patient reports that he is still very active in drawing which helps to decrease his anxiety/stress.  . Discussed plans with patient for ongoing  care management follow up and provided patient with direct contact information for care management team . Assisted patient/caregiver with obtaining information about health plan benefits . A voluntary and extensive discussion about advanced care planning including explanation and discussion of advanced was undertaken with the patient.  Explanation regarding healthcare proxy and living will was reviewed and packet with forms with explanation of how to  fill them out was given.   . Provided education to patient/caregiver regarding level of care options. . Provided education to patient/caregiver about Hospice and/or Palliative Care services . Emotional support provided due to ongoing stressors. Solution focused therapy implemented into entire session today. . Past update-LCSW received care coordination call from patient's [place]. She reports that program was unable to even get in the door at his yearly inspection due to hoarding and had to reschedule it. They gave patient a 30 day notice at this point. However, when they came back to complete the inspection, no progress had been made and he failed the inspection and is at risk for losing [employer]. LCSW typed a letter on behalf of patient to advocate for his situation considering his deliberating conditions and sent it to his caseworker. Patient had a hearing on 11/12/19 and they decided to allow patient another chance and did not discharge him from their program. However, patient must continue to work on his hoarding and meet their favorable living conditions requirements. LCSW will update entire team.  . LCSW completed development of long term plan of care and institution of self health management strategies with patient . LCSW FILED [facility] REPORT on 11/11/19 FOR SUSPECTED NEGLECT OF SELF AND INABILITY TO LIVE IN THE COMMUNITY INDEPENDENTLY. UPDATE -Patient is still involved with [facility]'s outpatient treatment program.  . Patient reports that he has started completing pod cast with friends which gives him a lot of purpose. Positive reinforcement provided for implementing appropriate self-care tools into his daily routine over the last 90 days.   Patient Self Care Activities:  . Attends all scheduled provider appointments . Lacks social connections  Please see past updates related to this goal by clicking on the "Past Updates" button in the selected goal       Follow Up  Plan: SW will follow up with patient by phone over the next quarter  [name], [place], MSW, [email or web address] Phone: [phone]

## 2020-04-28 ENCOUNTER — Telehealth: Payer: Self-pay | Admitting: General Practice

## 2020-04-28 ENCOUNTER — Ambulatory Visit: Payer: Self-pay | Admitting: General Practice

## 2020-04-28 DIAGNOSIS — E1129 Type 2 diabetes mellitus with other diabetic kidney complication: Secondary | ICD-10-CM | POA: Diagnosis not present

## 2020-04-28 DIAGNOSIS — I5032 Chronic diastolic (congestive) heart failure: Secondary | ICD-10-CM

## 2020-04-28 DIAGNOSIS — F419 Anxiety disorder, unspecified: Secondary | ICD-10-CM

## 2020-04-28 DIAGNOSIS — I10 Essential (primary) hypertension: Secondary | ICD-10-CM

## 2020-04-28 DIAGNOSIS — R809 Proteinuria, unspecified: Secondary | ICD-10-CM

## 2020-04-28 DIAGNOSIS — I4819 Other persistent atrial fibrillation: Secondary | ICD-10-CM

## 2020-04-28 DIAGNOSIS — E1169 Type 2 diabetes mellitus with other specified complication: Secondary | ICD-10-CM

## 2020-04-28 DIAGNOSIS — J449 Chronic obstructive pulmonary disease, unspecified: Secondary | ICD-10-CM

## 2020-04-28 DIAGNOSIS — E1159 Type 2 diabetes mellitus with other circulatory complications: Secondary | ICD-10-CM

## 2020-04-28 DIAGNOSIS — I25118 Atherosclerotic heart disease of native coronary artery with other forms of angina pectoris: Secondary | ICD-10-CM

## 2020-04-28 DIAGNOSIS — E785 Hyperlipidemia, unspecified: Secondary | ICD-10-CM

## 2020-04-28 NOTE — Chronic Care Management (AMB) (Signed)
Chronic Care Management   Follow Up Note   04/28/2020 Name: Evan [name] MRN: [id number] DOB: [birth year]  Referred by: [name], NP Reason for referral : Chronic Care Management (RNCM follow up call for Chronic Disease Management and Care Coordination Needs)   Evan [name] is a 64 y.o. year old male who is a primary care patient of [name], [name], NP. The CCM team was consulted for assistance with chronic disease management and care coordination needs.    Review of patient status, including review of consultants reports, relevant laboratory and other test results, and collaboration with appropriate care team members and the patient's provider was performed as part of comprehensive patient evaluation and provision of chronic care management services.    SDOH (Social Determinants of Health) assessments performed: Yes See Care Plan activities for detailed interventions related to [facility])     Outpatient Encounter Medications as of 04/28/2020  Medication Sig  . acetaminophen (TYLENOL) 500 MG tablet Take 500 mg by mouth every 6 (six) hours as needed.  Marland Kitchen aspirin 81 MG chewable tablet Chew 81 mg by mouth daily.  . cholecalciferol (VITAMIN D3) 25 MCG (1000 UNIT) tablet Take 1 tablet (1,000 Units total) by mouth daily.  . empagliflozin (JARDIANCE) 10 MG TABS tablet Take 10 mg by mouth daily.  . fluticasone (FLONASE) 50 MCG/ACT nasal spray Place 2 sprays into both nostrils daily.   . furosemide (LASIX) 40 MG tablet Take 1 tablet (40 mg total) by mouth 2 (two) times daily. Take 1 tablet (40 mg) by mouth twice a day (Patient taking differently: Take 40 mg by mouth 2 (two) times daily. Take 2 tablet (40 mg) by mouth in the morning and 1 tablet every evening)  . lisinopril (ZESTRIL) 10 MG tablet Take 10 mg by mouth daily.  . metoprolol succinate (TOPROL XL) 50 MG 24 hr tablet Take 1 tablet (50 mg total) by mouth daily.  . nitroGLYCERIN (NITROSTAT) 0.4 MG SL tablet Place 1 tablet (0.4 mg  total) under the tongue every 5 (five) minutes as needed for chest pain.  . pantoprazole (PROTONIX) 40 MG tablet Take 1 tablet (40 mg total) by mouth daily.  . risperiDONE (RISPERDAL) 0.5 MG tablet Take 1 tablet (0.5 mg total) by mouth 2 (two) times daily.  . rivaroxaban (XARELTO) 20 MG TABS tablet Take 1 tablet (20 mg total) by mouth at bedtime.  . rosuvastatin (CRESTOR) 40 MG tablet Take 40 mg by mouth daily.   No facility-administered encounter medications on file as of 04/28/2020.     Objective:  BP Readings from Last 3 Encounters:  04/08/20 128/86  03/31/20 (!) 172/96  03/15/20 (!) 150/86    Goals Addressed            This Visit's Progress   . RNCM: I have a lot of health problems but I manage okay       CARE PLAN ENTRY (see longtitudinal plan of care for additional care plan information)  Current Barriers:  . Chronic Disease Management support, education, and care coordination needs related to Atrial Fibrillation, CHF, CAD, HTN, HLD, DMII, Anxiety, and Depression  Clinical Goal(s) related to Atrial Fibrillation, CHF, CAD, HTN, HLD, DMII, Anxiety, and Depression:  Over the next 120 days, patient will:  . Work with the care management team to address educational, disease management, and care coordination needs  . Begin or continue self health monitoring activities as directed today  Measure and record blood pressure 3 times per week, and Measure  and record weight daily, DMII under control at this time as evidence by Hemoglobin A1C at 6.6 and last fasting blood sugar on labs of 113 . Call provider office for new or worsened signs and symptoms related to Hypoglycemia or Hyperglycemia, Blood pressure findings outside established parameters, Weight outside established parameters, and New or worsened symptom related to Chronic Health Conditions . Call care management team with questions or concerns . Verbalize basic understanding of patient centered plan of care established  today  Interventions related to Atrial Fibrillation, CHF, CAD, HTN, HLD, DMII, Anxiety, and Depression:  . Evaluation of current treatment plans and patient's adherence to plan as established by provider. 04-28-2020: The patient is doing well.  The patient saw the pcp on 04-08-2020 and had lab work. Review of cholesterol levels and potassium levels.  . Assessed patient understanding of disease states.  04-28-2020: The patient verbalized understanding of his chronic conditions. The patient verbalized compliance with his pill packs and states these are working well for him. Review of cholesterol medication.  The patient endorses taking medications as prescribed. The patient does not believe his acid reflux medication is working well. He just got his new pill packs on Friday. Education on giving it a little longer to see how the medication would work for him. Will continue to monitor.  . Assessed patient's education and care coordination needs- discussed taking blood pressures (patient does not have a cuff- discussed the OTC product book through [facility] and the monitoring program through [facility]).  04-28-2020 The patient is still not checking his blood pressure at home. States when he goes to the doctor it is good. The patient denies any issues with blood pressure at this time. Will continue to monitor.  . Assessed the patient's need for blood glucose testing on a daily basis.  04-28-2020: The patient's most recent hemoglobin A1C is 6.2 on 04-08-2020 and fasting blood sugar are WNL. Will continue to monitor for changes . Education to the patient about calling [facility] Customer service line to ask about Hardin Negus life alert system or comparable system since he lives alone   . Provided disease specific education to patient- education on the benefits of daily monitoring of VS and daily weight checks to maintain health and well being.  04-28-2020: The patient still does not weigh daily or check VS. States that he has lost weight and now  weighs 232  He denies any issues with edema. Education on watching for water weight and notifying provider for weight gain and fluid overload.  . Education on [place] and ADA diet- written information provided through the myChart function and EMMI.  04-28-2020: The patient endorses eating well and watching his sodium content. The patient states that he has a Medical sales representative and had it stocked well with food. Denies any needs for food resources at this time. Review of foods high in potassium. The patient verbalized that he had been eating a lot of bananas before his visit to see pcp on 04-08-2020. Also reviewed that potatoes, spinach, mangoes could also increase potassium levels and to drink more water per recommendations of pcp. Review of dietary habits and eating a lot of fried foods and foods high in cholesterol. The patient states he uses canola oil mainly. Education on healthy options and lowering cholesterol levels.  . Evaluation of the patients multiple medical conditions. 04-28-2020: The patient denies any health concerns related to his chronic conditions of AF/CHF/CAD, HTN, HLD and DMII.  The patient feels he is doing a good  job at managing his care at this time. The patient was calm and actually had to end the call to receive another call. The patient has a new ipad and is trying to keep track of his health, appointments and other things. The patient endorses taking medications as directed. The patient is enjoying drawing and this is keeping him busy. He was up late last night with friends but is doing well.  Nash Dimmer with appropriate clinical care team members regarding patient needs- patient is already involved with LCSW and Pharmacist, will continue to monitor for changes in condition and support. 02-06-2020: Supplied the numbers to the CCM team to the patient as he had an issue with his telephone and had to get a new one. It deleted his phone numbers.  The patient has contact information for the CCM team  now.  . Evaluation of upcoming appointments. Next appointment with pcp on 07-08-2020.   Patient Self Care Activities related to Atrial Fibrillation, CHF, CAD, HTN, HLD, DMII, Anxiety, and Depression:  . Patient is unable to independently self-manage chronic health conditions  Please see past updates related to this goal by clicking on the "Past Updates" button in the selected goal          Plan:   Telephone follow up appointment with care management team member scheduled for: 06-30-2020 at 1:45 pm   Noreene Larsson RN, MSN, [facility] Mobile: [phone]

## 2020-04-28 NOTE — Patient Instructions (Signed)
Visit Information  Goals Addressed            This Visit's Progress    RNCM: I have a lot of health problems but I manage okay       CARE PLAN ENTRY (see longtitudinal plan of care for additional care plan information)  Current Barriers:   Chronic Disease Management support, education, and care coordination needs related to Atrial Fibrillation, CHF, CAD, HTN, HLD, DMII, Anxiety, and Depression  Clinical Goal(s) related to Atrial Fibrillation, CHF, CAD, HTN, HLD, DMII, Anxiety, and Depression:  Over the next 120 days, patient will:   Work with the care management team to address educational, disease management, and care coordination needs   Begin or continue self health monitoring activities as directed today  Measure and record blood pressure 3 times per week, and Measure and record weight daily, DMII under control at this time as evidence by Hemoglobin A1C at 6.6 and last fasting blood sugar on labs of 113  Call provider office for new or worsened signs and symptoms related to Hypoglycemia or Hyperglycemia, Blood pressure findings outside established parameters, Weight outside established parameters, and New or worsened symptom related to Chronic Health Conditions  Call care management team with questions or concerns  Verbalize basic understanding of patient centered plan of care established today  Interventions related to Atrial Fibrillation, CHF, CAD, HTN, HLD, DMII, Anxiety, and Depression:   Evaluation of current treatment plans and patient's adherence to plan as established by provider. 04-28-2020: The patient is doing well.  The patient saw the pcp on 04-08-2020 and had lab work. Review of cholesterol levels and potassium levels.   Assessed patient understanding of disease states.  04-28-2020: The patient verbalized understanding of his chronic conditions. The patient verbalized compliance with his pill packs and states these are working well for him. Review of cholesterol  medication.  The patient endorses taking medications as prescribed. The patient does not believe his acid reflux medication is working well. He just got his new pill packs on Friday. Education on giving it a little longer to see how the medication would work for him. Will continue to monitor.   Assessed patient's education and care coordination needs- discussed taking blood pressures (patient does not have a cuff- discussed the OTC product book through [facility] and the monitoring program through [facility]).  04-28-2020 The patient is still not checking his blood pressure at home. States when he goes to the doctor it is good. The patient denies any issues with blood pressure at this time. Will continue to monitor.   Assessed the patient's need for blood glucose testing on a daily basis.  04-28-2020: The patient's most recent hemoglobin A1C is 6.2 on 04-08-2020 and fasting blood sugar are WNL. Will continue to monitor for changes  Education to the patient about calling [facility] Customer service line to ask about Hardin Negus life alert system or comparable system since he lives alone    Provided disease specific education to patient- education on the benefits of daily monitoring of VS and daily weight checks to maintain health and well being.  04-28-2020: The patient still does not weigh daily or check VS. States that he has lost weight and now weighs 232  He denies any issues with edema. Education on watching for water weight and notifying provider for weight gain and fluid overload.   Education on DASH diet and ADA diet- written information provided through the myChart function and EMMI.  04-28-2020: The patient endorses eating well and  watching his sodium content. The patient states that he has a Medical sales representative and had it stocked well with food. Denies any needs for food resources at this time. Review of foods high in potassium. The patient verbalized that he had been eating a lot of bananas before his visit to see pcp on 04-08-2020. Also  reviewed that potatoes, spinach, mangoes could also increase potassium levels and to drink more water per recommendations of pcp. Review of dietary habits and eating a lot of fried foods and foods high in cholesterol. The patient states he uses canola oil mainly. Education on healthy options and lowering cholesterol levels.   Evaluation of the patients multiple medical conditions. 04-28-2020: The patient denies any health concerns related to his chronic conditions of AF/CHF/CAD, HTN, HLD and DMII.  The patient feels he is doing a good job at managing his care at this time. The patient was calm and actually had to end the call to receive another call. The patient has a new ipad and is trying to keep track of his health, appointments and other things. The patient endorses taking medications as directed. The patient is enjoying drawing and this is keeping him busy. He was up late last night with friends but is doing well.   Collaborated with appropriate clinical care team members regarding patient needs- patient is already involved with LCSW and Pharmacist, will continue to monitor for changes in condition and support. 02-06-2020: Supplied the numbers to the CCM team to the patient as he had an issue with his telephone and had to get a new one. It deleted his phone numbers.  The patient has contact information for the CCM team now.   Evaluation of upcoming appointments. Next appointment with pcp on 07-08-2020.   Patient Self Care Activities related to Atrial Fibrillation, CHF, CAD, HTN, HLD, DMII, Anxiety, and Depression:   Patient is unable to independently self-manage chronic health conditions  Please see past updates related to this goal by clicking on the "Past Updates" button in the selected goal         Patient verbalizes understanding of instructions provided today.   Telephone follow up appointment with care management team member scheduled for: 06-30-2020 at 1:45 pm  [place], MSN,  [facility] Mobile: [phone]

## 2020-04-30 ENCOUNTER — Other Ambulatory Visit: Payer: Self-pay | Admitting: Nurse Practitioner

## 2020-04-30 ENCOUNTER — Ambulatory Visit: Payer: Self-pay | Admitting: Pharmacist

## 2020-04-30 ENCOUNTER — Encounter: Payer: Self-pay | Admitting: Nurse Practitioner

## 2020-04-30 MED ORDER — EUCERIN EX LOTN: TOPICAL_LOTION | CUTANEOUS | 2 refills | Status: DC | PRN

## 2020-04-30 NOTE — Chronic Care Management (AMB) (Signed)
Chronic Care Management Pharmacy  Name: Evan [name]  MRN: [id number] DOB: 29-Jun-1956   Chief Complaint/ HPI  Evan [name],  64 y.o. , male presents for their Follow-Up CCM visit with the clinical pharmacist via telephone due to COVID-19 Pandemic.  PCP : [name], NP Patient Care Team: [name], NP as PCP - General (Nurse Practitioner) Alisa Graff, FNP as Nurse Practitioner (Cardiology) Minna Merritts, MD as Consulting Physician (Cardiology) [name], RN as Case Manager ([place]) [name], LCSW as Social Worker (Licensed Clinical Social Worker) [name], [facility] as Pharmacist (Pharmacist)  Their chronic conditions include: Hypertension, Hyperlipidemia, Diabetes, Heart Failure, Coronary Artery Disease, GERD and Anxiety   Office Visits: 04/08/20- [name], NP - blood work, change omeprazole to pantoprazole  Consult Visit: 03/31/20 - [name], cardiology - BP 172/96  Allergies  Allergen Reactions   Penicillin G Hives   Sulfa Antibiotics Hives   Tiotropium    Zoloft [Sertraline Hcl] Other (See Comments)    Medications: Outpatient Encounter Medications as of 04/30/2020  Medication Sig   aspirin 81 MG chewable tablet Chew 81 mg by mouth daily.   cholecalciferol (VITAMIN D3) 25 MCG (1000 UNIT) tablet Take 1 tablet (1,000 Units total) by mouth daily.   empagliflozin (JARDIANCE) 10 MG TABS tablet Take 10 mg by mouth daily.   fluticasone (FLONASE) 50 MCG/ACT nasal spray Place 2 sprays into both nostrils daily.    furosemide (LASIX) 40 MG tablet Take 1 tablet (40 mg total) by mouth 2 (two) times daily. Take 1 tablet (40 mg) by mouth twice a day (Patient taking differently: Take 40 mg by mouth 2 (two) times daily. Take 2 tablet (40 mg) by mouth in the morning and 1 tablet every evening)   lisinopril (ZESTRIL) 10 MG tablet Take 10 mg by mouth daily.   metoprolol succinate (TOPROL XL) 50 MG 24 hr tablet Take  1 tablet (50 mg total) by mouth daily.   nitroGLYCERIN (NITROSTAT) 0.4 MG SL tablet Place 1 tablet (0.4 mg total) under the tongue every 5 (five) minutes as needed for chest pain.   Omega-3 Fatty Acids (OMEGA 3 500 PO) Take 500 mg by mouth daily.   pantoprazole (PROTONIX) 40 MG tablet Take 1 tablet (40 mg total) by mouth daily.   risperiDONE (RISPERDAL) 0.5 MG tablet Take 1 tablet (0.5 mg total) by mouth 2 (two) times daily.   rivaroxaban (XARELTO) 20 MG TABS tablet Take 1 tablet (20 mg total) by mouth at bedtime.   rosuvastatin (CRESTOR) 40 MG tablet Take 40 mg by mouth daily.   acetaminophen (TYLENOL) 500 MG tablet Take 500 mg by mouth every 6 (six) hours as needed.   No facility-administered encounter medications on file as of 04/30/2020.    Wt Readings from Last 3 Encounters:  04/08/20 233 lb (105.7 kg)  03/31/20 229 lb 6 oz (104 kg)  03/15/20 230 lb (104.3 kg)    Current Diagnosis/Assessment:    Goals Addressed              This Visit's Progress     PharmD "I am confused by my medications" (pt-stated)        Current Barriers:   Polypharmacy; complex patient with multiple comorbidities including ASCVD (hx CABG, MI); Afib, CHF, T2DM, osteoporosis, hx prostate cancer (s/p Lupron therapy, d/c d/t behavioral disturbances)  Patient notes that he is throwing up much less since starting Pantoprazole.  Current medications as prescribed: o  Afib/HTN/HFpEF: Xarelto 20 mg QPM, furosemide 40 mg BID, lisinopril 10 mg QAM, metoprolol succinate 50 mg QAM,  o ASCVD risk reduction: ASA 81 mg daily, rosuvastatin 40 mg daily; per records, last stent 2002 o T2DM: Jardiance 10 mg daily; A1c at goal <7% o Osteoporosis: Last DEXA showed t score -3.1% at femoral neck.; Vitamin D 1000 units daily o Anxiety, ?schizophrenia: risperdone 0.5 mg BID  Pharmacist Clinical Goal(s):   Over the next 90 days, patient will work with PharmD and provider towards optimized medication  management  Interventions:  Comprehensive medication review performed, medication list updated in electronic medical record  Inter-disciplinary care team collaboration (see longitudinal plan of care)  Reviewed that optimal PPI dosing is 30 minutes before a meal. Discussed a plan of taking out the omeprazole capsule (blue/purple), taking on an empty stomach at least 30 minutes before a meal, then eating breakfast, then taking his other morning medications. Patient agrees to this to see if helps reduce acid/reduce episodes of v  Discussed that we generally use PPI short term for acid reflux, but if issue is not resolved with short course, continued therapy is appropriate. He verbalizes understanding.  Contacted [facility] to see if they could package the omeprazole separately moving forward. They cannot do this based on constraints of packaging and still be able to offer to the patient for free.   Patient Self Care Activities:   Patient will take medications as prescribed  Please see past updates related to this goal by clicking on the "Past Updates" button in the selected goal         Hypertension/CHF   BP goal is:  <130/80  Office blood pressures are  BP Readings from Last 3 Encounters:  04/08/20 128/86  03/31/20 (!) 172/96  03/15/20 (!) 150/86   Patient checks BP at home infrequently Patient home BP readings are ranging: has not been documenting but states readings have been "good"  Patient has failed these meds in the past: not available in chart Patient is currently controlled on the following medications:   Furosemide 40 mg bid  Lisinopril 10 mg qd  toprol Xl 50 mg qd  Nitroglycerin 0.4 mg prn  We discussed diet and exercise extensively. Reports he has gotten a care giver and they have been walking several days per week.He has been able to walk without his cane. Reports losing weight. States he has been taking his BP at home but doesn't know the readings only that  they have been "good". Encouraged to document and provide at future appointments.   Plan  Continue current medications     Diabetes   A1c goal <7%  Recent Relevant Labs: Lab Results  Component Value Date/Time   HGBA1C 6.2 04/08/2020 09:18 AM   HGBA1C 6.3 12/10/2019 08:41 AM   GFR 98.05 09/12/2013 10:35 AM   GFR 98.79 07/26/2011 12:34 PM   MICROALBUR 80 (H) 09/09/2019 10:47 AM    Last diabetic Eye exam:  Lab Results  Component Value Date/Time   HMDIABEYEEXA Retinopathy (A) 01/14/2019 12:00 AM    Last diabetic Foot exam: No results found for: HMDIABFOOTEX   Checking BG: Never.  Reports not checking at home because his sugar is controlled.   Patient has failed these meds in past: Metfromin Patient is currently controlled on the following medications:  Jardiance 10 mg qd  We discussed: diet and exercise extensively and how to recognize and treat signs of hypoglycemia. Reports he is cooking his meals with his pressure cooker. He  does not check glucose at home. Denies any symptoms of hypoglycemia.  Plan  Continue current medications      Medication Management   Patient is requesting refill for Eucerin cream reporting his feet are scaly again. Will request from PCP.  Pt uses [facility] for all medications Uses pill box? Packaging and delivery  Pt denies any missed medications.  Plan  Continue current medication management strategy    Follow up: 3 month phone visit  Junita Push. Kenton Kingfisher PharmD, [facility] [phone]

## 2020-05-06 ENCOUNTER — Other Ambulatory Visit: Payer: Self-pay

## 2020-05-06 MED ORDER — ROSUVASTATIN CALCIUM 40 MG PO TABS: 40.0000 mg | ORAL_TABLET | Freq: Every day | ORAL | 1 refills | Status: DC

## 2020-05-15 ENCOUNTER — Other Ambulatory Visit: Payer: Self-pay | Admitting: Nurse Practitioner

## 2020-05-15 NOTE — Telephone Encounter (Signed)
Requested medication (s) are due for refill today: -  Requested medication (s) are on the active medication list: -  Last refill:  12/10/19  Future visit scheduled: yes  Notes to clinic:  historical med and provider   Requested Prescriptions  Pending Prescriptions Disp Refills   lisinopril (ZESTRIL) 10 MG tablet [Pharmacy Med Name: LISINOPRIL 10 MG TAB] 90 tablet     Sig: TAKE 1 TABLET BY MOUTH ONCE DAILY      Cardiovascular:  ACE Inhibitors Failed - 05/15/2020  1:43 PM      Failed - K in normal range and within 180 days    Potassium  Date Value Ref Range Status  04/08/2020 5.3 (H) 3.5 - 5.2 mmol/L Final  11/29/2014 4.0 mmol/L Final    Comment:    3.5-5.1 NOTE: New Reference Range  10/13/14           Passed - Cr in normal range and within 180 days    Creatinine  Date Value Ref Range Status  11/29/2014 1.20 mg/dL Final    Comment:    0.61-1.24 NOTE: New Reference Range  10/13/14    Creatinine, Ser  Date Value Ref Range Status  04/08/2020 0.84 0.76 - 1.27 mg/dL Final          Passed - Patient is not pregnant      Passed - Last BP in normal range    BP Readings from Last 1 Encounters:  04/08/20 128/86          Passed - Valid encounter within last 6 months    Recent Outpatient Visits           1 month ago Type 2 diabetes mellitus with proteinuria ([place])   [place], [name] T, NP   5 months ago Type 2 diabetes mellitus with proteinuria ([place])   [place], [name] T, NP   6 months ago Chronic diastolic CHF (congestive heart failure) ([place])   [place] [name], [name], NP   8 months ago Annual physical exam   [place] [name], [name] T, NP   9 months ago Type 2 diabetes mellitus with hyperglycemia, without long-term current use of insulin ([place])   [place], [place] T, NP       Future Appointments             In 1 month [name], [place] T, NP [facility]   In 9 months  [facility]             Refused Prescriptions Disp Refills   furosemide (LASIX) 40 MG tablet [Pharmacy Med Name: FUROSEMIDE 40 MG TAB] 180 tablet     Sig: TAKE 2 TABLETS BY MOUTH ONCE EVERY MORNING AND 1 TABLET ONCE EVERY EVENING      Cardiovascular:  Diuretics - Loop Failed - 05/15/2020  1:43 PM      Failed - K in normal range and within 360 days    Potassium  Date Value Ref Range Status  04/08/2020 5.3 (H) 3.5 - 5.2 mmol/L Final  11/29/2014 4.0 mmol/L Final    Comment:    3.5-5.1 NOTE: New Reference Range  10/13/14           Passed - Ca in normal range and within 360 days    Calcium  Date Value Ref Range Status  04/08/2020 9.6 8.6 - 10.2 mg/dL Final   Calcium, Total  Date Value Ref Range Status  11/29/2014 9.0 mg/dL Final    Comment:    8.9-10.3 NOTE: New Reference Range  10/13/14           Passed - Na in normal range and within 360 days    Sodium  Date Value Ref Range Status  04/08/2020 138 134 - 144 mmol/L Final  11/29/2014 137 mmol/L Final    Comment:    135-145 NOTE: New Reference Range  10/13/14           Passed - Cr in normal range and within 360 days    Creatinine  Date Value Ref Range Status  11/29/2014 1.20 mg/dL Final    Comment:    0.61-1.24 NOTE: New Reference Range  10/13/14    Creatinine, Ser  Date Value Ref Range Status  04/08/2020 0.84 0.76 - 1.27 mg/dL Final          Passed - Last BP in normal range    BP Readings from Last 1 Encounters:  04/08/20 128/86          Passed - Valid encounter within last 6 months    Recent Outpatient Visits           1 month ago Type 2 diabetes mellitus with proteinuria ([place])   [place], [name] T, NP   5 months ago Type 2 diabetes mellitus with proteinuria ([place])   [place], [name] T, NP   6 months ago Chronic diastolic CHF (congestive heart failure) ([place])   [place] [name],  [name], NP   8 months ago Annual physical exam   [place] [name], [name] T, NP   9 months ago Type 2 diabetes mellitus with hyperglycemia, without long-term current use of insulin ([place])   [place], [name], NP       Future Appointments             In 1 month [name], [name], NP [facility]   In 9 months  [facility]

## 2020-05-15 NOTE — Telephone Encounter (Signed)
Requested Prescriptions  Pending Prescriptions Disp Refills   lisinopril (ZESTRIL) 10 MG tablet [Pharmacy Med Name: LISINOPRIL 10 MG TAB] 90 tablet     Sig: TAKE 1 TABLET BY MOUTH ONCE DAILY     Cardiovascular:  ACE Inhibitors Failed - 05/15/2020  1:43 PM      Failed - K in normal range and within 180 days    Potassium  Date Value Ref Range Status  04/08/2020 5.3 (H) 3.5 - 5.2 mmol/L Final  11/29/2014 4.0 mmol/L Final    Comment:    3.5-5.1 NOTE: New Reference Range  10/13/14          Passed - Cr in normal range and within 180 days    Creatinine  Date Value Ref Range Status  11/29/2014 1.20 mg/dL Final    Comment:    0.61-1.24 NOTE: New Reference Range  10/13/14    Creatinine, Ser  Date Value Ref Range Status  04/08/2020 0.84 0.76 - 1.27 mg/dL Final         Passed - Patient is not pregnant      Passed - Last BP in normal range    BP Readings from Last 1 Encounters:  04/08/20 128/86         Passed - Valid encounter within last 6 months    Recent Outpatient Visits          1 month ago Type 2 diabetes mellitus with proteinuria ([place])   [place], [name] T, NP   5 months ago Type 2 diabetes mellitus with proteinuria ([place])   [place], [name] T, NP   6 months ago Chronic diastolic CHF (congestive heart failure) ([place])   [place] [name], [name], NP   8 months ago Annual physical exam   [place] [name], [name] T, NP   9 months ago Type 2 diabetes mellitus with hyperglycemia, without long-term current use of insulin ([place])   [place], [place] T, NP      Future Appointments            In 1 month [name], [place] T, NP [facility]   In 9 months  [facility]            furosemide (LASIX) 40 MG tablet [Pharmacy Med Name: FUROSEMIDE 40 MG TAB] 180 tablet     Sig: TAKE 2 TABLETS BY MOUTH ONCE EVERY MORNING AND 1 TABLET ONCE EVERY  EVENING     Cardiovascular:  Diuretics - Loop Failed - 05/15/2020  1:43 PM      Failed - K in normal range and within 360 days    Potassium  Date Value Ref Range Status  04/08/2020 5.3 (H) 3.5 - 5.2 mmol/L Final  11/29/2014 4.0 mmol/L Final    Comment:    3.5-5.1 NOTE: New Reference Range  10/13/14          Passed - Ca in normal range and within 360 days    Calcium  Date Value Ref Range Status  04/08/2020 9.6 8.6 - 10.2 mg/dL Final   Calcium, Total  Date Value Ref Range Status  11/29/2014 9.0 mg/dL Final    Comment:    8.9-10.3 NOTE: New Reference Range  10/13/14          Passed - Na in normal range and within 360 days    Sodium  Date Value Ref Range Status  04/08/2020 138 134 - 144 mmol/L Final  11/29/2014  137 mmol/L Final    Comment:    135-145 NOTE: New Reference Range  10/13/14          Passed - Cr in normal range and within 360 days    Creatinine  Date Value Ref Range Status  11/29/2014 1.20 mg/dL Final    Comment:    0.61-1.24 NOTE: New Reference Range  10/13/14    Creatinine, Ser  Date Value Ref Range Status  04/08/2020 0.84 0.76 - 1.27 mg/dL Final         Passed - Last BP in normal range    BP Readings from Last 1 Encounters:  04/08/20 128/86         Passed - Valid encounter within last 6 months    Recent Outpatient Visits          1 month ago Type 2 diabetes mellitus with proteinuria ([place])   [place], [name] T, NP   5 months ago Type 2 diabetes mellitus with proteinuria ([place])   [place], [name] T, NP   6 months ago Chronic diastolic CHF (congestive heart failure) ([place])   [place] [name], [name], NP   8 months ago Annual physical exam   [place] [name], [name] T, NP   9 months ago Type 2 diabetes mellitus with hyperglycemia, without long-term current use of insulin ([place])   [place], [name], NP      Future Appointments             In 1 month [name], [name], NP [facility]   In 9 months  [facility]

## 2020-05-17 ENCOUNTER — Other Ambulatory Visit: Payer: Self-pay | Admitting: Nurse Practitioner

## 2020-05-18 ENCOUNTER — Telehealth: Payer: Self-pay | Admitting: Pharmacist

## 2020-05-18 ENCOUNTER — Other Ambulatory Visit: Payer: Self-pay | Admitting: Nurse Practitioner

## 2020-05-18 NOTE — Telephone Encounter (Signed)
Sam pharmacist with [facility] called for Rx refill approval so this Rx can be sent out with the rest of pt order.

## 2020-05-18 NOTE — Chronic Care Management (AMB) (Signed)
.    Chronic Care Management Pharmacy Assistant   Name: Evan [name]  MRN: [id number] DOB: 1955/10/10  Reason for Encounter: Disease State/ General Adherence  Patient Questions:  1.  Have you seen any other providers since your last visit? NO  2.  Any changes in your medicines or health? No    PCP : [name], NP  Allergies:   Allergies  Allergen Reactions  . Penicillin G Hives  . Sulfa Antibiotics Hives  . Tiotropium   . Zoloft [Sertraline Hcl] Other (See Comments)    Medications: Outpatient Encounter Medications as of 05/18/2020  Medication Sig  . acetaminophen (TYLENOL) 500 MG tablet Take 500 mg by mouth every 6 (six) hours as needed.  Marland Kitchen aspirin 81 MG chewable tablet Chew 81 mg by mouth daily.  . cholecalciferol (VITAMIN D3) 25 MCG (1000 UNIT) tablet Take 1 tablet (1,000 Units total) by mouth daily.  . Emollient (EUCERIN) lotion Apply topically as needed for dry skin. Apply to legs and arms daily to help with dry skin.  Marland Kitchen empagliflozin (JARDIANCE) 10 MG TABS tablet Take 10 mg by mouth daily.  . fluticasone (FLONASE) 50 MCG/ACT nasal spray Place 2 sprays into both nostrils daily.   . furosemide (LASIX) 40 MG tablet TAKE 2 TABLETS BY MOUTH ONCE EVERY MORNING AND 1 TABLET ONCE EVERY EVENING  . lisinopril (ZESTRIL) 10 MG tablet TAKE 1 TABLET BY MOUTH ONCE DAILY  . metoprolol succinate (TOPROL XL) 50 MG 24 hr tablet Take 1 tablet (50 mg total) by mouth daily.  . nitroGLYCERIN (NITROSTAT) 0.4 MG SL tablet Place 1 tablet (0.4 mg total) under the tongue every 5 (five) minutes as needed for chest pain.  . Omega-3 Fatty Acids (OMEGA 3 500 PO) Take 500 mg by mouth daily.  . pantoprazole (PROTONIX) 40 MG tablet Take 1 tablet (40 mg total) by mouth daily.  . risperiDONE (RISPERDAL) 0.5 MG tablet Take 1 tablet (0.5 mg total) by mouth 2 (two) times daily.  . rivaroxaban (XARELTO) 20 MG TABS tablet Take 1 tablet (20 mg total) by mouth at bedtime.  . rosuvastatin (CRESTOR) 40  MG tablet Take 1 tablet (40 mg total) by mouth daily.   No facility-administered encounter medications on file as of 05/18/2020.    Current Diagnosis: Patient Active Problem List   Diagnosis Date Noted  . Atherosclerosis of aorta ([place]) 12/04/2019  . Persistent atrial fibrillation ([place]) 09/12/2019  . CAD (coronary artery disease) 09/12/2019  . Chronic venous stasis 09/12/2019  . Hoarding disorder 09/12/2019  . Cervical spinal stenosis 09/12/2019  . Diabetic retinopathy ([place]) 09/12/2019  . COPD, mild ([place]) 09/12/2019  . Osteoporosis 09/09/2019  . History of prostate cancer 09/09/2019  . Anxiety 08/10/2019  . Obstructive sleep apnea on CPAP 08/10/2019  . Hyperlipidemia associated with type 2 diabetes mellitus ([place]) 08/10/2019  . Chronic diastolic CHF (congestive heart failure) ([place]) 01/09/2014  . Esophageal dysmotility 09/12/2013  . Type 2 diabetes mellitus with proteinuria ([place]) 02/14/2012  . Obesity, morbid (more than 100 lbs over ideal weight or BMI > 40) ([place]) 07/26/2011  . OLD MYOCARDIAL INFARCTION 03/02/2010  . Hypertension associated with diabetes ([place]) 03/20/2009  . CORONARY ATHEROSCLEROSIS, ARTERY BYPASS GRAFT 03/20/2009    Goals Addressed   None     Spoke to patient on mobile phone number. Patient was pleasant and cooperative during phone conversation. Patient explained he has not been checking his blood sugars at home due to not having a blood glucose meter. He explained it  had stopped working about 2 weeks ago. He requested a new machine, lancets and testing strips. He also explained his nebulizer machine was "out of date." Stated he had not used his nebulizer in a long time, but still wanted a new one in the event that he ever needed to use it. Patient could not give me a date on the last time he did use his nebulizer.   Patient also expressed he is still having issues with his reflux. Patient is suppose to be taking his Protonix 40mg  daily, but he reports he takes the  medication as needed. He explained "it is to strong for me because it makes me sick." I explained to patient that the medication will not be effective if not taken as directed. He did express that has been taking Nexium OTC which has been helping with his acid reflex. He also takes TUMS as needed. Patient reports his acid reflux symptoms as being "filled up with mucus". Reports in the mornings when waking he has a large amount of sputum produced. Reported sputum is either clear or white at times. Also explained before bed at times this production of sputum occurs. Patient reported no shortness of breath or any adverse symptoms when these events happen. Patient reported that his PCP is fully aware of these episodes of production of sputum.   Patient also reported that sometimes in the morning and at night before bed he does vomit after taking his medications. He explained he does take his medications after eating.  I reviewed all of the information he reported during our phone call. Explained that I would look into the process of requesting a new blood glucose monitor and nebulizer. Gave patient my direct phone number who verbalized the number back to me. I explained I would call patient with update regarding his request for new machines. Patient verbalized understanding.   Follow-Up:  Pharmacist Review

## 2020-05-18 NOTE — Telephone Encounter (Signed)
Change of pharmacy Requested Prescriptions  Pending Prescriptions Disp Refills  . furosemide (LASIX) 40 MG tablet [Pharmacy Med Name: FUROSEMIDE 40 MG TAB] 180 tablet 3    Sig: TAKE 2 TABLETS BY MOUTH ONCE EVERY MORNING AND 1 TABLET ONCE EVERY EVENING     Cardiovascular:  Diuretics - Loop Failed - 05/18/2020  9:46 AM      Failed - K in normal range and within 360 days    Potassium  Date Value Ref Range Status  04/08/2020 5.3 (H) 3.5 - 5.2 mmol/L Final  11/29/2014 4.0 mmol/L Final    Comment:    3.5-5.1 NOTE: New Reference Range  10/13/14          Passed - Ca in normal range and within 360 days    Calcium  Date Value Ref Range Status  04/08/2020 9.6 8.6 - 10.2 mg/dL Final   Calcium, Total  Date Value Ref Range Status  11/29/2014 9.0 mg/dL Final    Comment:    8.9-10.3 NOTE: New Reference Range  10/13/14          Passed - Na in normal range and within 360 days    Sodium  Date Value Ref Range Status  04/08/2020 138 134 - 144 mmol/L Final  11/29/2014 137 mmol/L Final    Comment:    135-145 NOTE: New Reference Range  10/13/14          Passed - Cr in normal range and within 360 days    Creatinine  Date Value Ref Range Status  11/29/2014 1.20 mg/dL Final    Comment:    0.61-1.24 NOTE: New Reference Range  10/13/14    Creatinine, Ser  Date Value Ref Range Status  04/08/2020 0.84 0.76 - 1.27 mg/dL Final         Passed - Last BP in normal range    BP Readings from Last 1 Encounters:  04/08/20 128/86         Passed - Valid encounter within last 6 months    Recent Outpatient Visits          1 month ago Type 2 diabetes mellitus with proteinuria ([place])   [place], [name] T, NP   5 months ago Type 2 diabetes mellitus with proteinuria ([place])   [place], [name] T, NP   6 months ago Chronic diastolic CHF (congestive heart failure) ([place])   [place] [name], [name], NP   8 months ago Annual  physical exam   [place] [name], [name] T, NP   9 months ago Type 2 diabetes mellitus with hyperglycemia, without long-term current use of insulin ([place])   [place], [name], NP      Future Appointments            In 1 month [name], [name], NP [facility]   In 9 months  [facility]

## 2020-05-20 ENCOUNTER — Other Ambulatory Visit: Payer: Self-pay | Admitting: Nurse Practitioner

## 2020-05-20 MED ORDER — ONETOUCH ULTRASOFT LANCETS MISC: 12 refills | Status: DC

## 2020-05-20 MED ORDER — ONETOUCH VERIO W/DEVICE KIT: PACK | 0 refills | Status: DC

## 2020-05-20 MED ORDER — ONETOUCH VERIO VI STRP: ORAL_STRIP | 12 refills | Status: DC

## 2020-06-15 ENCOUNTER — Telehealth: Payer: Self-pay | Admitting: Pharmacist
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
## 2020-06-21 ENCOUNTER — Telehealth: Payer: Self-pay

## 2020-06-30 ENCOUNTER — Telehealth: Payer: Self-pay

## 2020-07-08 ENCOUNTER — Encounter: Payer: Self-pay | Admitting: Nurse Practitioner

## 2020-07-08 ENCOUNTER — Ambulatory Visit (INDEPENDENT_AMBULATORY_CARE_PROVIDER_SITE_OTHER): Payer: Medicare Other | Admitting: Nurse Practitioner

## 2020-07-08 ENCOUNTER — Other Ambulatory Visit: Payer: Self-pay

## 2020-07-08 VITALS — BP 134/84 | HR 83 | Temp 98.0°F | Wt 235.6 lb

## 2020-07-08 DIAGNOSIS — E1169 Type 2 diabetes mellitus with other specified complication: Secondary | ICD-10-CM | POA: Diagnosis not present

## 2020-07-08 DIAGNOSIS — E785 Hyperlipidemia, unspecified: Secondary | ICD-10-CM | POA: Diagnosis not present

## 2020-07-08 DIAGNOSIS — I5032 Chronic diastolic (congestive) heart failure: Secondary | ICD-10-CM | POA: Diagnosis not present

## 2020-07-08 DIAGNOSIS — E1159 Type 2 diabetes mellitus with other circulatory complications: Secondary | ICD-10-CM

## 2020-07-08 DIAGNOSIS — I4819 Other persistent atrial fibrillation: Secondary | ICD-10-CM

## 2020-07-08 DIAGNOSIS — I152 Hypertension secondary to endocrine disorders: Secondary | ICD-10-CM

## 2020-07-08 DIAGNOSIS — E11319 Type 2 diabetes mellitus with unspecified diabetic retinopathy without macular edema: Secondary | ICD-10-CM

## 2020-07-08 DIAGNOSIS — F419 Anxiety disorder, unspecified: Secondary | ICD-10-CM

## 2020-07-08 DIAGNOSIS — Z8546 Personal history of malignant neoplasm of prostate: Secondary | ICD-10-CM

## 2020-07-08 DIAGNOSIS — Z1211 Encounter for screening for malignant neoplasm of colon: Secondary | ICD-10-CM

## 2020-07-08 DIAGNOSIS — R809 Proteinuria, unspecified: Secondary | ICD-10-CM | POA: Diagnosis not present

## 2020-07-08 DIAGNOSIS — K224 Dyskinesia of esophagus: Secondary | ICD-10-CM

## 2020-07-08 DIAGNOSIS — E1129 Type 2 diabetes mellitus with other diabetic kidney complication: Secondary | ICD-10-CM

## 2020-07-08 DIAGNOSIS — J449 Chronic obstructive pulmonary disease, unspecified: Secondary | ICD-10-CM | POA: Diagnosis not present

## 2020-07-08 LAB — BAYER DCA HB A1C WAIVED: HB A1C (BAYER DCA - WAIVED): 6.3 % (ref <7.0)

## 2020-07-08 MED ORDER — ONETOUCH ULTRASOFT LANCETS MISC: 12 refills | Status: DC

## 2020-07-08 MED ORDER — ONETOUCH VERIO W/DEVICE KIT: PACK | 0 refills | Status: DC

## 2020-07-08 MED ORDER — VITAMIN D 25 MCG (1000 UNIT) PO TABS
1000.0000 [IU] | ORAL_TABLET | Freq: Every day | ORAL | 4 refills | Status: DC
Start: 2020-07-08 — End: 2020-12-04

## 2020-07-08 MED ORDER — ONETOUCH VERIO VI STRP: ORAL_STRIP | 12 refills | Status: DC

## 2020-07-08 MED ORDER — LISINOPRIL 10 MG PO TABS: 10.0000 mg | ORAL_TABLET | Freq: Every day | ORAL | 4 refills | Status: DC

## 2020-07-08 MED ORDER — RISPERIDONE 0.5 MG PO TABS: 0.5000 mg | ORAL_TABLET | Freq: Two times a day (BID) | ORAL | 4 refills | Status: DC

## 2020-07-08 MED ORDER — RIVAROXABAN 20 MG PO TABS: 20.0000 mg | ORAL_TABLET | Freq: Every day | ORAL | 4 refills | Status: DC

## 2020-07-08 MED ORDER — METOPROLOL SUCCINATE ER 50 MG PO TB24
50.0000 mg | ORAL_TABLET | Freq: Every day | ORAL | 4 refills | Status: DC
Start: 2020-07-08 — End: 2021-05-11

## 2020-07-08 MED ORDER — EMPAGLIFLOZIN 10 MG PO TABS: 10.0000 mg | ORAL_TABLET | Freq: Every day | ORAL | 4 refills | Status: DC

## 2020-07-08 NOTE — Assessment & Plan Note (Signed)
Chronic, ongoing with A1C below goal at 6.3% and urine ALB 80 and A:C 30-300 at last visit.  Continue Lisinopril for kidney protection.  Continue current Jardiance, benefit with his HF.  Check BMP today.  Recommend he check BS at least 3 mornings a week at home -- resent glucometer to [facility].  Return to office in 3 months.  Continue pill packs for consistent medication adherence.

## 2020-07-08 NOTE — Assessment & Plan Note (Signed)
Chronic, ongoing.  Continue collaboration with cardiology and current medication regimen, including Metoprolol and Xarelto.  BMP today.

## 2020-07-08 NOTE — Progress Notes (Signed)
BP 134/84 (BP Location: Left Arm)   Pulse 83   Temp 98 F (36.7 C)   Wt 235 lb 9.6 oz (106.9 kg)   SpO2 97%   BMI 39.21 kg/m    Subjective:    Patient ID: Evan [name], male    DOB: 1956-06-18, 64 y.o.   MRN: [id number]  HPI: Evan [name] is a 64 y.o. male  Chief Complaint  Patient presents with  . Diabetes  . Hypertension  . Gastroesophageal Reflux  . Anxiety   DIABETES Continues on Jardiance 10 MG daily. A1C in September 6.2%. Hypoglycemic episodes:no Polydipsia/polyuria:no Visual disturbance:no Chest pain:no Paresthesias:no Glucose Monitoring:no Accucheck frequency: Not Checking Fasting glucose: Post prandial: Evening: Before meals: Taking Insulin?:no Long acting insulin: Short acting insulin: Blood Pressure Monitoring:not checking Retinal Examination:Up to Date Foot Exam:Up to Date Pneumovax:unknown Influenza:unknown Aspirin:yes  HYPERTENSION / HYPERLIPIDEMIA Saw cardiology on 03/15/20 with no changes made and saw NP [facility] with HF clinic last 03/31/20. Recent EF 08/28/2019 was 50 to 55%. Uses CPAP at home about 100% of the time.  No recent NTG use.  Has history of prostate cancer and did attend follow-up with Dr. Baruch Gouty on 02/12/20.  PSA at visit 0.05. Satisfied with current treatment?yes Duration of hypertension:chronic BP monitoring frequency:not checking BP range:  BP medication side effects:no Duration of hyperlipidemia:chronic Cholesterol medication side effects:no Cholesterol supplements: none Medication compliance:good compliance Aspirin:yes Recent stressors:no Recurrent headaches:no Visual changes:no Palpitations:no Dyspnea:no Chest pain:no Lower extremity edema:no Dizzy/lightheaded:no  GERD Has been followed by GI in past for dysphagia, last visit with EGD November 2017.  Previously took Zantac, but was changed  to Prilosec which he takes 20 MG daily.  Reports good control.  Has underlying mild COPD with no current inhalers. GERD control status: uncontrolled  Satisfied with current treatment? no Heartburn frequency: 1-2 times a day Medication side effects: no  Medication compliance: stable Previous GERD medications: Zantac Antacid use frequency:  1-2 times a day Duration: since 2017 Nature: burning Location: epigastric Heartburn duration: 1 hour Alleviatiating factors:  TUMS sometimes Aggravating factors: certain foods Dysphagia: no Odynophagia:  no Hematemesis: no Blood in stool: no EGD: yes   ANXIETY/STRESS Continues on Risperdal 0.5 MG BID for mood, which he reports is helpful.  When [facility] there was concern he did not take medication consistently, noted during nursing visits on review of notes, there continues to be concern about this, although has pill packs now.    He continues to refuse referral to psychiatry and continues to discuss his friendships with multiple different famous people -- Lemmie Evens and Pump Back -- he showed provider an IG live today where they were "talking to me", they did mention his name on the live feed. Duration:stable Anxious mood:no Excessive worrying:no Irritability:no Sweating:no Nausea:no Palpitations:no Hyperventilation:no Panic attacks:no Agoraphobia:no Obscessions/compulsions:yes Depressed mood:no Depression screen [facility] 2/9 07/08/2020 04/08/2020 02/16/2020 12/10/2019 11/05/2019  Decreased Interest 0 0 0 0 0  Down, Depressed, Hopeless 0 0 0 0 0  PHQ - 2 Score 0 0 0 0 0  Altered sleeping 0 0 - 0 0  Tired, decreased energy 0 0 - 0 0  Change in appetite 0 0 - 0 0  Feeling bad or failure about yourself  0 0 - 0 0  Trouble concentrating 0 0 - 0 0  Moving slowly or fidgety/restless 0 0 - 0 0  Suicidal thoughts 0 0 - 0 0  PHQ-9 Score 0 0 - 0 0  Difficult  doing work/chores Not difficult at all Not  difficult at all - - Not difficult at all  Some recent data might be hidden   GAD 7 : Generalized Anxiety Score 04/08/2020 03/31/2020 12/10/2019 11/05/2019  Nervous, Anxious, on Edge 0 0 1 1  Control/stop worrying 1 0 1 0  Worry too much - different things 1 0 1 1  Trouble relaxing 0 0 0 1  Restless 0 0 0 0  Easily annoyed or irritable 0 0 1 0  Afraid - awful might happen 0 0 0 0  Total GAD 7 Score 2 0 4 3  Anxiety Difficulty Not difficult at all Not difficult at all Somewhat difficult Not difficult at all     Relevant past medical, surgical, family and social history reviewed and updated as indicated. Interim medical history since our last visit reviewed. Allergies and medications reviewed and updated.  Review of Systems  Constitutional: Negative for activity change, diaphoresis, fatigue and fever.  Respiratory: Negative for cough, chest tightness, shortness of breath and wheezing.   Cardiovascular: Positive for leg swelling (baseline per his report). Negative for chest pain and palpitations.  Gastrointestinal: Negative for abdominal distention, abdominal pain, constipation, diarrhea, nausea and vomiting.  Endocrine: Negative for cold intolerance, heat intolerance, polydipsia, polyphagia and polyuria.  Neurological: Negative for dizziness, syncope, weakness, light-headedness, numbness and headaches.  Psychiatric/Behavioral: Negative for decreased concentration, self-injury, sleep disturbance and suicidal ideas. The patient is nervous/anxious.     Per HPI unless specifically indicated above     Objective:    BP 134/84 (BP Location: Left Arm)   Pulse 83   Temp 98 F (36.7 C)   Wt 235 lb 9.6 oz (106.9 kg)   SpO2 97%   BMI 39.21 kg/m   Wt Readings from Last 3 Encounters:  07/08/20 235 lb 9.6 oz (106.9 kg)  04/08/20 233 lb (105.7 kg)  03/31/20 229 lb 6 oz (104 kg)    Physical Exam Vitals and nursing note reviewed.  Constitutional:      General: He is awake. He is not in acute  distress.    Appearance: He is well-developed. He is morbidly obese. He is not ill-appearing.  HENT:     Head: Normocephalic and atraumatic.     Right Ear: Hearing normal. No drainage.     Left Ear: Hearing normal. No drainage.  Eyes:     General: Lids are normal.        Right eye: No discharge.        Left eye: No discharge.     Pupils: Pupils are equal, round, and reactive to light.  Neck:     Thyroid: No thyromegaly.     Vascular: No carotid bruit or JVD.     Trachea: Trachea normal.  Cardiovascular:     Rate and Rhythm: Normal rate and regular rhythm.     Heart sounds: Normal heart sounds, S1 normal and S2 normal. No murmur heard.  No gallop.   Pulmonary:     Effort: Pulmonary effort is normal. No accessory muscle usage or respiratory distress.     Breath sounds: Normal breath sounds.  Abdominal:     General: Bowel sounds are normal.     Palpations: Abdomen is soft.     Tenderness: There is no abdominal tenderness.  Musculoskeletal:        General: Normal range of motion.     Cervical back: Normal range of motion and neck supple.     Right lower leg: Edema (  trace) present.     Left lower leg: Edema (trace) present.  Lymphadenopathy:     Cervical: No cervical adenopathy.  Skin:    General: Skin is warm and dry.     Capillary Refill: Capillary refill takes less than 2 seconds.  Neurological:     Mental Status: He is alert and oriented to person, place, and time.     Gait: Gait is intact.  Psychiatric:        Attention and Perception: Attention normal.        Mood and Affect: Mood normal.        Speech: Speech normal.        Behavior: Behavior normal. Behavior is cooperative.     Comments: Tangential thought processes.  Talked about his friendships with [name] and Ivin Booty.  This is baseline for him.    Diabetic Foot Exam - Simple   Simple Foot Form Visual Inspection See comments: Yes Sensation Testing See comments: Yes Pulse Check See comments:  Yes Comments Bilateral pulses DP and PT 1+.  Dry skin bilateral.  Trace edema present bilaterally.  Sensation left 7/10 and right 8/10.    Results for orders placed or performed in visit on 81/44/81  Basic metabolic panel  Result Value Ref Range   Glucose 127 (H) 65 - 99 mg/dL   BUN 17 8 - 27 mg/dL   Creatinine, Ser 0.84 0.76 - 1.27 mg/dL   GFR calc non Af Amer 92 >59 mL/min/1.73   GFR calc Af Amer 106 >59 mL/min/1.73   BUN/Creatinine Ratio 20 10 - 24   Sodium 138 134 - 144 mmol/L   Potassium 5.3 (H) 3.5 - 5.2 mmol/L   Chloride 102 96 - 106 mmol/L   CO2 21 20 - 29 mmol/L   Calcium 9.6 8.6 - 10.2 mg/dL  Bayer DCA Hb A1c Waived  Result Value Ref Range   HB A1C (BAYER DCA - WAIVED) 6.2 <7.0 %  Lipid Panel w/o Chol/HDL Ratio  Result Value Ref Range   Cholesterol, Total 247 (H) 100 - 199 mg/dL   Triglycerides 164 (H) 0 - 149 mg/dL   HDL 46 >39 mg/dL   VLDL Cholesterol Cal 30 5 - 40 mg/dL   LDL Chol Calc (NIH) 171 (H) 0 - 99 mg/dL      Assessment & Plan:   Problem List Items Addressed This Visit      Cardiovascular and Mediastinum   Hypertension associated with diabetes (HCC)    Chronic, ongoing.  BP initially elevated, but repeat improved, baseline.  Recommend checking these at home a few mornings a week and documenting + focus on DASH diet.  Continue current medication regimen and adjust as needed + collaboration with cardiology. BMP today. Return in 3 months.      Relevant Medications   lisinopril (ZESTRIL) 10 MG tablet   empagliflozin (JARDIANCE) 10 MG TABS tablet   rivaroxaban (XARELTO) 20 MG TABS tablet   metoprolol succinate (TOPROL XL) 50 MG 24 hr tablet   Other Relevant Orders   Bayer DCA Hb A1c Waived   Chronic diastolic CHF (congestive heart failure) (HCC)    Chronic, ongoing. Continue collaboration with cardiology and HF clinic. Recommend he take medication consistently. Recommend: - Reminded to call for an overnight weight gain of >2 pounds or a weekly weight  weight of >5 pounds - not adding salt to his food and has been reading food labels. Reviewed the importance of keeping daily sodium intake to 2000mg  daily  -  Return in 2 months for visit and labs.      Relevant Medications   lisinopril (ZESTRIL) 10 MG tablet   rivaroxaban (XARELTO) 20 MG TABS tablet   metoprolol succinate (TOPROL XL) 50 MG 24 hr tablet   Persistent atrial fibrillation (HCC)    Chronic, ongoing.  Continue collaboration with cardiology and current medication regimen, including Metoprolol and Xarelto.  BMP today.      Relevant Medications   lisinopril (ZESTRIL) 10 MG tablet   rivaroxaban (XARELTO) 20 MG TABS tablet   metoprolol succinate (TOPROL XL) 50 MG 24 hr tablet     Respiratory   COPD, mild (HCC)    Chronic, stable.  No current inhalers.  Plan on spirometry at physical in new year.  Initiate inhalers as needed.        Digestive   Esophageal dysmotility    Ongoing, reports poor control.  Will continue Protonix 40 MG daily, discussed with patient and educated him.  Check Mag level next visit.  GI referral placed.      Relevant Orders   Ambulatory referral to Gastroenterology     Endocrine   Type 2 diabetes mellitus with proteinuria ([place]) - Primary    Chronic, ongoing with A1C below goal at 6.3% and urine ALB 80 and A:C 30-300 at last visit.  Continue Lisinopril for kidney protection.  Continue current Jardiance, benefit with his HF.  Check BMP today.  Recommend he check BS at least 3 mornings a week at home -- resent glucometer to [place].  Return to office in 3 months.  Continue pill packs for consistent medication adherence.      Relevant Medications   lisinopril (ZESTRIL) 10 MG tablet   empagliflozin (JARDIANCE) 10 MG TABS tablet   Other Relevant Orders   Bayer DCA Hb A1c Waived   Basic metabolic panel   Ambulatory referral to Ophthalmology   Hyperlipidemia associated with type 2 diabetes mellitus (HCC)    Chronic, ongoing.  Continue current  medication regimen and adjust as needed.  Return in 3 months for visit.  Lipid panel today.      Relevant Medications   lisinopril (ZESTRIL) 10 MG tablet   empagliflozin (JARDIANCE) 10 MG TABS tablet   Other Relevant Orders   Bayer DCA Hb A1c Waived   Lipid Panel w/o Chol/HDL Ratio   Diabetic retinopathy ([place])    Refer to diabetes plan -- placed ophthalmology referral as due for exam.      Relevant Medications   lisinopril (ZESTRIL) 10 MG tablet   empagliflozin (JARDIANCE) 10 MG TABS tablet     Other   Obesity, morbid (more than 100 lbs over ideal weight or BMI > 40) (HCC)    BMI 39.21 with T2DM, HTN.  Recommended eating smaller high protein, low fat meals more frequently and exercising 30 mins a day 5 times a week with a goal of 10-15lb weight loss in the next 3 months. Patient voiced their understanding and motivation to adhere to these recommendations.       Relevant Medications   empagliflozin (JARDIANCE) 10 MG TABS tablet   Anxiety    Chronic, ongoing with some grandiose thought processes present, possible schizophrenia on presentation.  Continue current medication regimen and adjust as needed.  Has history on review old records of missing doses Risperdall, will continue current medication at this time and adjust as needed.  Continue collaboration with CCM SW.  Would benefit from psychiatry and therapy, but he refuses these.  Will continue to  encourage this and discuss with him.      History of prostate cancer    Continue collaboration with oncology and urology.  PSA up to date at recent oncology visit.       Other Visit Diagnoses    Colon cancer screening       GI referral.   Relevant Orders   Ambulatory referral to Gastroenterology       Follow up plan: Return in about 3 months (around 10/06/2020) for Annual physical with spirometry.

## 2020-07-08 NOTE — Assessment & Plan Note (Signed)
Refer to diabetes plan -- placed ophthalmology referral as due for exam.

## 2020-07-08 NOTE — Patient Instructions (Signed)

## 2020-07-08 NOTE — Assessment & Plan Note (Signed)
Chronic, ongoing.  Continue current medication regimen and adjust as needed.  Return in 3 months for visit.  Lipid panel today.

## 2020-07-08 NOTE — Assessment & Plan Note (Signed)
Chronic, ongoing.  BP initially elevated, but repeat improved, baseline.  Recommend checking these at home a few mornings a week and documenting + focus on DASH diet.  Continue current medication regimen and adjust as needed + collaboration with cardiology. BMP today. Return in 3 months.

## 2020-07-08 NOTE — Assessment & Plan Note (Signed)
Chronic, ongoing with some grandiose thought processes present, possible schizophrenia on presentation.  Continue current medication regimen and adjust as needed.  Has history on review old records of missing doses Risperdall, will continue current medication at this time and adjust as needed.  Continue collaboration with CCM SW.  Would benefit from psychiatry and therapy, but he refuses these.  Will continue to encourage this and discuss with him.

## 2020-07-08 NOTE — Assessment & Plan Note (Signed)
BMI 39.21 with T2DM, HTN.  Recommended eating smaller high protein, low fat meals more frequently and exercising 30 mins a day 5 times a week with a goal of 10-15lb weight loss in the next 3 months. Patient voiced their understanding and motivation to adhere to these recommendations.

## 2020-07-08 NOTE — Assessment & Plan Note (Addendum)
Ongoing, reports poor control.  Will continue Protonix 40 MG daily, discussed with patient and educated him.  Check Mag level next visit.  GI referral placed.

## 2020-07-08 NOTE — Assessment & Plan Note (Signed)
Chronic, stable.  No current inhalers.  Plan on spirometry at physical in new year.  Initiate inhalers as needed.

## 2020-07-08 NOTE — Assessment & Plan Note (Signed)
Continue collaboration with oncology and urology.  PSA up to date at recent oncology visit.

## 2020-07-08 NOTE — Assessment & Plan Note (Signed)
Chronic, ongoing. Continue collaboration with cardiology and HF clinic. Recommend he take medication consistently. Recommend: - Reminded to call for an overnight weight gain of >2 pounds or a weekly weight weight of >5 pounds - not adding salt to his food and has been reading food labels. Reviewed the importance of keeping daily sodium intake to <2000mg daily  - Return in 2 months for visit and labs. 

## 2020-07-09 LAB — LIPID PANEL W/O CHOL/HDL RATIO
Cholesterol, Total: 176 mg/dL (ref 100–199)
HDL: 46 mg/dL (ref >39)
LDL Chol Calc (NIH): 105 mg/dL — ABNORMAL HIGH (ref 0–99)
Triglycerides: 140 mg/dL (ref 0–149)
VLDL Cholesterol Cal: 25 mg/dL (ref 5–40)

## 2020-07-09 LAB — BASIC METABOLIC PANEL
BUN/Creatinine Ratio: 16 (ref 10–24)
BUN: 14 mg/dL (ref 8–27)
CO2: 20 mmol/L (ref 20–29)
Calcium: 9.3 mg/dL (ref 8.6–10.2)
Chloride: 105 mmol/L (ref 96–106)
Creatinine, Ser: 0.87 mg/dL (ref 0.76–1.27)
GFR calc Af Amer: 105 mL/min/{1.73_m2} (ref >59)
GFR calc non Af Amer: 91 mL/min/{1.73_m2} (ref >59)
Glucose: 153 mg/dL — ABNORMAL HIGH (ref 65–99)
Potassium: 4.5 mmol/L (ref 3.5–5.2)
Sodium: 140 mmol/L (ref 134–144)

## 2020-07-09 NOTE — Progress Notes (Signed)
Contacted via [place] morning [facility]ural, your labs have returned.  Your kidney function remains stable.  Cholesterol levels show some improvement, but LDL is still a little elevated.  Please ensure you are taking your Rosuvastatin daily from your pill packs.  If ongoing elevation next visit we may add another medication to help lower these levels.  It is called Zetia.  Would like to see LDL less than 70 and your level is 105.  Please come fasting to next visit.  Have a wonderful day!! Keep being awesome!!  Thank you for allowing me to participate in your care. Kindest regards, Joao Mccurdy

## 2020-07-12 ENCOUNTER — Telehealth: Payer: Self-pay

## 2020-07-14 ENCOUNTER — Telehealth: Payer: Self-pay

## 2020-07-22 ENCOUNTER — Ambulatory Visit: Payer: Self-pay

## 2020-07-22 NOTE — Telephone Encounter (Signed)
Looks like we are dealing with housing issue again, when I just talked to him things had been well, so not sure what changed.  Could you reach out and see if needs assistance or reach out to his SW with [employer] if she is still following.  If I need to do anything, please let me know.  Thank you.

## 2020-07-22 NOTE — Telephone Encounter (Signed)
Pt. Reports he is being evicted and is upset. Heart rate goes up and down. "They are putting me at risk by upsetting me." Denies any chest pain. Would like to talk to [facility]. Reason for Disposition . [1] Palpitations AND [2] no improvement after using CARE ADVICE  Answer Assessment - Initial Assessment Questions 1. DESCRIPTION: "Please describe your heart rate or heartbeat that you are having" (e.g., fast/slow, regular/irregular, skipped or extra beats, "palpitations")     Fast heart rate beat 2. ONSET: "When did it start?" (Minutes, hours or days)      Started months ago 3. DURATION: "How long does it last" (e.g., seconds, minutes, hours)     Minutes 4. PATTERN "Does it come and go, or has it been constant since it started?"  "Does it get worse with exertion?"   "Are you feeling it now?"     Comes and goes 5. TAP: "Using your hand, can you tap out what you are feeling on a chair or table in front of you, so that I can hear?" (Note: not all patients can do this)       No 6. HEART RATE: "Can you tell me your heart rate?" "How many beats in 15 seconds?"  (Note: not all patients can do this)       No 7. RECURRENT SYMPTOM: "Have you ever had this before?" If Yes, ask: "When was the last time?" and "What happened that time?"      Yes 8. CAUSE: "What do you think is causing the palpitations?"     Unsure 9. CARDIAC HISTORY: "Do you have any history of heart disease?" (e.g., heart attack, angina, bypass surgery, angioplasty, arrhythmia)      Yes 10. OTHER SYMPTOMS: "Do you have any other symptoms?" (e.g., dizziness, chest pain, sweating, difficulty breathing)       No 11. PREGNANCY: "Is there any chance you are pregnant?" "When was your last menstrual period?"       n/a  Protocols used: HEART RATE AND HEARTBEAT QUESTIONS-A-AH

## 2020-07-23 ENCOUNTER — Telehealth: Payer: Self-pay

## 2020-07-23 ENCOUNTER — Telehealth: Payer: Self-pay | Admitting: Pharmacist

## 2020-07-23 NOTE — Telephone Encounter (Signed)
I was unable to document on Nurse triage note.Pt did state he received a eviction letter .Pt stated that we have a copy of a overturn evections notice and he is upset he is dealing with the evections again. Pt stated he was unable to get rid of stuff due to thrift stores being closed, Pt wanted to make provider pt wanted [place] to let provider know he is ok now and he just wanted to let her know what was going on. Pt wanted Jerene Pitch also to know what was going on.Pt asked if there is a Mandy in out care team or if he was being hacked. Pt stated he would call back to give [place] Mandy's number to verify if she is some one with cone.
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
## 2020-07-26 ENCOUNTER — Telehealth: Payer: Self-pay | Admitting: Pharmacist

## 2020-07-26 NOTE — Telephone Encounter (Signed)
Patient has been r/s  

## 2020-07-26 NOTE — Telephone Encounter (Signed)
Pt has been scheduled.  °

## 2020-07-26 NOTE — Progress Notes (Addendum)
Chronic Care Management Pharmacy Assistant   Name: BRANNON LEVENE  MRN: [id number] DOB: Dec 03, 1955  Reason for Encounter: Chronic Care Management/ Incoming Call From Patient    PCP : [name], NP  Allergies:   Allergies  Allergen Reactions   Penicillin G Hives   Sulfa Antibiotics Hives   Tiotropium    Zoloft [Sertraline Hcl] Other (See Comments)    Medications: Outpatient Encounter Medications as of 07/26/2020  Medication Sig   acetaminophen (TYLENOL) 500 MG tablet Take 500 mg by mouth every 6 (six) hours as needed.   aspirin 81 MG chewable tablet Chew 81 mg by mouth daily.   Blood Glucose Monitoring Suppl (ONETOUCH VERIO) w/Device KIT Use to check blood sugar 3 to 4 times a day and document.  Please bring to visits for review.   cholecalciferol (VITAMIN D3) 25 MCG (1000 UNIT) tablet Take 1 tablet (1,000 Units total) by mouth daily.   Emollient (EUCERIN) lotion Apply topically as needed for dry skin. Apply to legs and arms daily to help with dry skin.   empagliflozin (JARDIANCE) 10 MG TABS tablet Take 1 tablet (10 mg total) by mouth daily.   fluticasone (FLONASE) 50 MCG/ACT nasal spray Place 2 sprays into both nostrils daily.    furosemide (LASIX) 40 MG tablet TAKE 2 TABLETS BY MOUTH ONCE EVERY MORNING AND 1 TABLET ONCE EVERY EVENING   glucose blood (ONETOUCH VERIO) test strip Use to check blood sugar 3 to 4 times a day.   Lancets (ONETOUCH ULTRASOFT) lancets Use to check blood sugar 3 to 4 times a day.   lisinopril (ZESTRIL) 10 MG tablet Take 1 tablet (10 mg total) by mouth daily.   metoprolol succinate (TOPROL XL) 50 MG 24 hr tablet Take 1 tablet (50 mg total) by mouth daily.   nitroGLYCERIN (NITROSTAT) 0.4 MG SL tablet Place 1 tablet (0.4 mg total) under the tongue every 5 (five) minutes as needed for chest pain.   Omega-3 Fatty Acids (OMEGA 3 500 PO) Take 500 mg by mouth daily.   pantoprazole (PROTONIX) 40 MG tablet Take 1 tablet (40 mg total) by mouth daily.    risperiDONE (RISPERDAL) 0.5 MG tablet Take 1 tablet (0.5 mg total) by mouth 2 (two) times daily.   rivaroxaban (XARELTO) 20 MG TABS tablet Take 1 tablet (20 mg total) by mouth at bedtime.   rosuvastatin (CRESTOR) 40 MG tablet Take 1 tablet (40 mg total) by mouth daily.   No facility-administered encounter medications on file as of 07/26/2020.    Current Diagnosis: Patient Active Problem List   Diagnosis Date Noted   Atherosclerosis of aorta ([place]) 12/04/2019   Persistent atrial fibrillation ([place]) 09/12/2019   CAD (coronary artery disease) 09/12/2019   Chronic venous stasis 09/12/2019   Hoarding disorder 09/12/2019   Cervical spinal stenosis 09/12/2019   Diabetic retinopathy ([place]) 09/12/2019   COPD, mild ([place]) 09/12/2019   Osteoporosis 09/09/2019   History of prostate cancer 09/09/2019   Anxiety 08/10/2019   Obstructive sleep apnea on CPAP 08/10/2019   Hyperlipidemia associated with type 2 diabetes mellitus ([place]) 08/10/2019   Chronic diastolic CHF (congestive heart failure) ([place]) 01/09/2014   Esophageal dysmotility 09/12/2013   Type 2 diabetes mellitus with proteinuria ([place]) 02/14/2012   Obesity, morbid (more than 100 lbs over ideal weight or BMI > 40) ([place]) 07/26/2011   OLD MYOCARDIAL INFARCTION 03/02/2010   Hypertension associated with diabetes ([place]) 03/20/2009   CORONARY ATHEROSCLEROSIS, ARTERY BYPASS GRAFT 03/20/2009    07-26-20: The patient  called in explaining he had not had any chest pains or palpitations over the weekend. Patient was hard to redirect with the conversation being scattered. He would discuss his blood pressure and diabetes and suddenly start to talk about his situation with [employer]. Patient was noted rambling at times which was hard to follow. Attempted several times throughout the conversation to explain what my purpose is related to his health and chronic conditions. Each time the patient verbalized understanding to information given. PharmD  Birdena Crandall made aware of situation.     Cloretta Ned, LPN Clinical Pharmacist Assistant  [phone]    Follow-Up:  Patient Assistance Coordination with Chronic Care Team.   I have reviewed the care management and care coordination activities outlined in this encounter and I am certifying that I agree with the content of this note.  Junita Push. Kenton Kingfisher PharmD, [facility] [phone]

## 2020-08-01 ENCOUNTER — Encounter: Payer: Self-pay | Admitting: Emergency Medicine

## 2020-08-01 ENCOUNTER — Emergency Department
Admission: EM | Admit: 2020-08-01 | Discharge: 2020-08-01 | Disposition: A | Discharge status: Home / Self Care | Payer: Medicare Other | Attending: Emergency Medicine | Admitting: Emergency Medicine

## 2020-08-01 ENCOUNTER — Other Ambulatory Visit: Payer: Self-pay

## 2020-08-01 DIAGNOSIS — I110 Hypertensive heart disease with heart failure: Secondary | ICD-10-CM | POA: Insufficient documentation

## 2020-08-01 DIAGNOSIS — I25110 Atherosclerotic heart disease of native coronary artery with unstable angina pectoris: Secondary | ICD-10-CM | POA: Insufficient documentation

## 2020-08-01 DIAGNOSIS — Z79899 Other long term (current) drug therapy: Secondary | ICD-10-CM | POA: Insufficient documentation

## 2020-08-01 DIAGNOSIS — Z7982 Long term (current) use of aspirin: Secondary | ICD-10-CM | POA: Diagnosis not present

## 2020-08-01 DIAGNOSIS — R404 Transient alteration of awareness: Secondary | ICD-10-CM | POA: Diagnosis not present

## 2020-08-01 DIAGNOSIS — I5032 Chronic diastolic (congestive) heart failure: Secondary | ICD-10-CM | POA: Diagnosis not present

## 2020-08-01 DIAGNOSIS — Z955 Presence of coronary angioplasty implant and graft: Secondary | ICD-10-CM | POA: Diagnosis not present

## 2020-08-01 DIAGNOSIS — E11319 Type 2 diabetes mellitus with unspecified diabetic retinopathy without macular edema: Secondary | ICD-10-CM | POA: Diagnosis not present

## 2020-08-01 DIAGNOSIS — E119 Type 2 diabetes mellitus without complications: Secondary | ICD-10-CM | POA: Diagnosis not present

## 2020-08-01 DIAGNOSIS — R6889 Other general symptoms and signs: Secondary | ICD-10-CM | POA: Diagnosis not present

## 2020-08-01 DIAGNOSIS — J449 Chronic obstructive pulmonary disease, unspecified: Secondary | ICD-10-CM | POA: Insufficient documentation

## 2020-08-01 DIAGNOSIS — R42 Dizziness and giddiness: Secondary | ICD-10-CM

## 2020-08-01 DIAGNOSIS — I4891 Unspecified atrial fibrillation: Secondary | ICD-10-CM | POA: Diagnosis not present

## 2020-08-01 DIAGNOSIS — I509 Heart failure, unspecified: Secondary | ICD-10-CM

## 2020-08-01 DIAGNOSIS — Z743 Need for continuous supervision: Secondary | ICD-10-CM | POA: Diagnosis not present

## 2020-08-01 DIAGNOSIS — R5381 Other malaise: Secondary | ICD-10-CM | POA: Diagnosis not present

## 2020-08-01 LAB — DIFFERENTIAL
Abs Immature Granulocytes: 0.02 10*3/uL (ref 0.00–0.07)
Basophils Absolute: 0 10*3/uL (ref 0.0–0.1)
Basophils Relative: 1 %
Eosinophils Absolute: 0.3 10*3/uL (ref 0.0–0.5)
Eosinophils Relative: 4 %
Immature Granulocytes: 0 %
Lymphocytes Relative: 16 %
Lymphs Abs: 1.3 10*3/uL (ref 0.7–4.0)
Monocytes Absolute: 0.6 10*3/uL (ref 0.1–1.0)
Monocytes Relative: 7 %
Neutro Abs: 6 10*3/uL (ref 1.7–7.7)
Neutrophils Relative %: 72 %

## 2020-08-01 LAB — COMPREHENSIVE METABOLIC PANEL
ALT: 13 U/L (ref 0–44)
AST: 16 U/L (ref 15–41)
Albumin: 4 g/dL (ref 3.5–5.0)
Alkaline Phosphatase: 73 U/L (ref 38–126)
Anion gap: 7 (ref 5–15)
BUN: 16 mg/dL (ref 8–23)
CO2: 24 mmol/L (ref 22–32)
Calcium: 8.7 mg/dL — ABNORMAL LOW (ref 8.9–10.3)
Chloride: 100 mmol/L (ref 98–111)
Creatinine, Ser: 0.8 mg/dL (ref 0.61–1.24)
GFR, Estimated: >60 mL/min (ref >60)
Glucose, Bld: 144 mg/dL — ABNORMAL HIGH (ref 70–99)
Potassium: 3.6 mmol/L (ref 3.5–5.1)
Sodium: 131 mmol/L — ABNORMAL LOW (ref 135–145)
Total Bilirubin: 1.1 mg/dL (ref 0.3–1.2)
Total Protein: 7.5 g/dL (ref 6.5–8.1)

## 2020-08-01 LAB — CBC
HCT: 43.5 % (ref 39.0–52.0)
Hemoglobin: 13.8 g/dL (ref 13.0–17.0)
MCH: 27.6 pg (ref 26.0–34.0)
MCHC: 31.7 g/dL (ref 30.0–36.0)
MCV: 87 fL (ref 80.0–100.0)
Platelets: 215 10*3/uL (ref 150–400)
RBC: 5 MIL/uL (ref 4.22–5.81)
RDW: 15.9 % — ABNORMAL HIGH (ref 11.5–15.5)
WBC: 8.2 10*3/uL (ref 4.0–10.5)
nRBC: 0 % (ref 0.0–0.2)

## 2020-08-01 LAB — PROTIME-INR
INR: 1 (ref 0.8–1.2)
Prothrombin Time: 13.2 seconds (ref 11.4–15.2)

## 2020-08-01 LAB — APTT: aPTT: 34 seconds (ref 24–36)

## 2020-08-01 MED ORDER — HYDROXYZINE HCL 25 MG PO TABS: 25.0000 mg | ORAL_TABLET | Freq: Three times a day (TID) | ORAL | 0 refills | Status: DC | PRN

## 2020-08-01 NOTE — ED Triage Notes (Signed)
Pt to ED via [facility] c/o dizziness and bilateral LE pain and swelling. Pt states that he has hx/o vertigo and has been having symptoms since getting an eviction notice on 12/16. Pt is currently in NAD.

## 2020-08-01 NOTE — ED Provider Notes (Signed)
[facility] Department Provider Note  ____________________________________________  Time seen: Approximately 12:10 PM  I have reviewed the triage vital signs and the nursing notes.   HISTORY  Chief Complaint Dizziness    HPI Evan [name] is a 64 y.o. male with a history of CAD COPD atrial fibrillation diabetes CHF anxiety who comes ED complaining of vertigo that is occurred in a few discrete episodes over the past 10 days ever since his landlord started eviction proceedings.  Denies chest pain or shortness of breath, no vomiting or diarrhea.  Most recently he had an episode yesterday afternoon of vertigo that lasted 3 minutes, associated with spinning sensation, nausea and one episode of vomiting.  It then resolved and he felt back to normal.  No falls or head trauma.    Past Medical History:  Diagnosis Date  . Anginal pain ([place])   . Anxiety disorder   . Asthma   . Atresia of esophagus without fistula   . CAD (coronary artery disease)   . Cellulitis   . CHF (congestive heart failure) ([place])    NYHA CLASS III,CHRONIC,DIASTOLIC  . COPD (chronic obstructive pulmonary disease) ([place])   . Diabetes mellitus without complication ([place])   . Edema    RIGHT LOWER LEG  . Gastroesophageal reflux   . H/O: GI bleed   . History of pneumonia    Remote  . History of scarlet fever    Childhood  . Hyperlipidemia   . Hypertension   . Myocardial infarction ([place]) 2009  . Obesity   . Obstructive sleep apnea   . Pain    CHRONIC BACK / ABDOMINAL  . Panic disorder   . Peripheral venous insufficiency   . PTSD (post-traumatic stress disorder)   . Retinopathy    DIABETIC  . Stasis, venous   . Vertigo      Patient Active Problem List   Diagnosis Date Noted  . Atherosclerosis of aorta ([place]) 12/04/2019  . Persistent atrial fibrillation ([place]) 09/12/2019  . CAD (coronary artery disease) 09/12/2019  . Chronic venous stasis 09/12/2019  . Hoarding disorder  09/12/2019  . Cervical spinal stenosis 09/12/2019  . Diabetic retinopathy ([place]) 09/12/2019  . COPD, mild ([place]) 09/12/2019  . Osteoporosis 09/09/2019  . History of prostate cancer 09/09/2019  . Anxiety 08/10/2019  . Obstructive sleep apnea on CPAP 08/10/2019  . Hyperlipidemia associated with type 2 diabetes mellitus ([place]) 08/10/2019  . Chronic diastolic CHF (congestive heart failure) ([place]) 01/09/2014  . Esophageal dysmotility 09/12/2013  . Type 2 diabetes mellitus with proteinuria ([place]) 02/14/2012  . Obesity, morbid (more than 100 lbs over ideal weight or BMI > 40) ([place]) 07/26/2011  . OLD MYOCARDIAL INFARCTION 03/02/2010  . Hypertension associated with diabetes ([place]) 03/20/2009  . CORONARY ATHEROSCLEROSIS, ARTERY BYPASS GRAFT 03/20/2009     Past Surgical History:  Procedure Laterality Date  . CARDIAC CATHETERIZATION    . CATARACT EXTRACTION Left   . CATARACT EXTRACTION W/PHACO Right 05/03/2017   Procedure: CATARACT EXTRACTION PHACO AND INTRAOCULAR LENS PLACEMENT (IOC);  Surgeon: Leandrew Koyanagi, MD;  Location: [facility] ORS;  Service: Ophthalmology;  Laterality: Right;  [place] 00:35.3 AP% 12.3 CDE 4.33 Fluid Pack lot # [id number]       . CORONARY ANGIOPLASTY WITH STENT PLACEMENT  2002  . CORONARY ANGIOPLASTY WITH STENT PLACEMENT  1999  . CORONARY ARTERY BYPASS GRAFT     x7  . ESOPHAGOGASTRODUODENOSCOPY N/A 09/19/2016   Procedure: ESOPHAGOGASTRODUODENOSCOPY (EGD);  Surgeon: Lollie Sails, MD;  [facility]  ENDOSCOPY;  Service: Endoscopy;  Laterality: N/A;     Prior to Admission medications   Medication Sig Start Date End Date Taking? Authorizing Provider  acetaminophen (TYLENOL) 500 MG tablet Take 500 mg by mouth every 6 (six) hours as needed.    [provider]  aspirin 81 MG chewable tablet Chew 81 mg by mouth daily.    [provider]  Blood Glucose Monitoring Suppl (ONETOUCH VERIO) w/Device KIT Use to check blood sugar 3 to 4 times a day and document.   Please bring to visits for review. 07/08/20   [name], Henrine Screws T, NP  cholecalciferol (VITAMIN D3) 25 MCG (1000 UNIT) tablet Take 1 tablet (1,000 Units total) by mouth daily. 07/08/20   [name], Henrine Screws T, NP  Emollient (EUCERIN) lotion Apply topically as needed for dry skin. Apply to legs and arms daily to help with dry skin. 04/30/20   [name], Henrine Screws T, NP  empagliflozin (JARDIANCE) 10 MG TABS tablet Take 1 tablet (10 mg total) by mouth daily. 07/08/20   [name], Henrine Screws T, NP  fluticasone (FLONASE) 50 MCG/ACT nasal spray Place 2 sprays into both nostrils daily.  12/31/13   [provider]  furosemide (LASIX) 40 MG tablet TAKE 2 TABLETS BY MOUTH ONCE EVERY MORNING AND 1 TABLET ONCE EVERY EVENING 05/18/20   [name], [name] T, NP  glucose blood (ONETOUCH VERIO) test strip Use to check blood sugar 3 to 4 times a day. 07/08/20   [name], Henrine Screws T, NP  hydrOXYzine (ATARAX/VISTARIL) 25 MG tablet Take 1 tablet (25 mg total) by mouth 3 (three) times daily as needed (dizziness or anxiety). 08/01/20   Carrie Mew, MD  Lancets [facility] ULTRASOFT) lancets Use to check blood sugar 3 to 4 times a day. 07/08/20   [name], Henrine Screws T, NP  lisinopril (ZESTRIL) 10 MG tablet Take 1 tablet (10 mg total) by mouth daily. 07/08/20   [name], Henrine Screws T, NP  metoprolol succinate (TOPROL XL) 50 MG 24 hr tablet Take 1 tablet (50 mg total) by mouth daily. 07/08/20   [name], Henrine Screws T, NP  nitroGLYCERIN (NITROSTAT) 0.4 MG SL tablet Place 1 tablet (0.4 mg total) under the tongue every 5 (five) minutes as needed for chest pain. 10/10/19   [name], Henrine Screws T, NP  Omega-3 Fatty Acids (OMEGA 3 500 PO) Take 500 mg by mouth daily.    [provider]  pantoprazole (PROTONIX) 40 MG tablet Take 1 tablet (40 mg total) by mouth daily. 04/08/20   [name], Henrine Screws T, NP  risperiDONE (RISPERDAL) 0.5 MG tablet Take 1 tablet (0.5 mg total) by mouth 2 (two) times daily. 07/08/20   [name], Henrine Screws T, NP  rivaroxaban (XARELTO) 20 MG TABS  tablet Take 1 tablet (20 mg total) by mouth at bedtime. 07/08/20   [name], Henrine Screws T, NP  rosuvastatin (CRESTOR) 40 MG tablet Take 1 tablet (40 mg total) by mouth daily. 05/06/20   [name] T, NP     Allergies Penicillin g, Sulfa antibiotics, Tiotropium, and Zoloft [sertraline hcl]   Family History  Problem Relation Age of Onset  . Heart attack Mother   . Hypertension Mother   . Hyperlipidemia Mother   . Heart attack Brother 64       MI  . Coronary artery disease Other     Social History Social History   Tobacco Use  . Smoking status: Never Smoker  . Smokeless tobacco: Never Used  Vaping Use  . Vaping Use: Never used  Substance Use Topics  . Alcohol use:  No  . Drug use: No    Review of Systems  Constitutional:   No fever or chills.  ENT:   No sore throat. No rhinorrhea. Cardiovascular:   No chest pain or syncope. Respiratory:   No dyspnea or cough. Gastrointestinal:   Negative for abdominal pain, vomiting and diarrhea.  Musculoskeletal:   Chronic pain bilateral lower extremities All other systems reviewed and are negative except as documented above in ROS and HPI.  ____________________________________________   PHYSICAL EXAM:  VITAL SIGNS: ED Triage Vitals  Enc Vitals Group     BP 08/01/20 0819 (!) 162/102     Pulse Rate 08/01/20 0819 73     Resp 08/01/20 0819 16     Temp 08/01/20 0819 98.7 F (37.1 C)     Temp Source 08/01/20 0819 Oral     SpO2 08/01/20 0819 96 %     Weight 08/01/20 0815 239 lb (108.4 kg)     Height 08/01/20 0815 5' 5"  (1.651 m)     Head Circumference --      Peak Flow --      Pain Score 08/01/20 0815 9     Pain Loc --      Pain Edu? --      Excl. in [place]? --     Vital signs reviewed, nursing assessments reviewed.   Constitutional:   Alert and oriented. Non-toxic appearance. Eyes:   Conjunctivae are normal. EOMI. no nystagmus ENT      Head:   Normocephalic and atraumatic.      Nose:   Wearing a mask.      Mouth/Throat:    Wearing a mask.      Neck:   No meningismus. Full ROM. Hematological/Lymphatic/Immunilogical:   No cervical lymphadenopathy. Cardiovascular:   Irregularly irregular rhythm, heart rate 80. Symmetric bilateral radial and DP pulses.  No murmurs. Cap refill less than 2 seconds. Respiratory:   Normal respiratory effort without tachypnea/retractions. Breath sounds are clear and equal bilaterally. No wheezes/rales/rhonchi. Gastrointestinal:   Obese, soft and nontender. Non distended.  No rebound, rigidity, or guarding.  Small local hernia which is soft and nontender  Musculoskeletal:   Normal range of motion in all extremities. No joint effusions.  No lower extremity tenderness.  No edema. Neurologic:   Normal speech and language.  Motor grossly intact.  Ambulatory with steady gait.  Cerebellar function intact.  No drift No acute focal neurologic deficits are appreciated.  Skin:    Skin is warm, dry and intact. No rash noted.  No petechiae, purpura, or bullae.  ____________________________________________    LABS (pertinent positives/negatives) (all labs ordered are listed, but only abnormal results are displayed) Labs Reviewed  CBC - Abnormal; Notable for the following components:      Result Value   RDW 15.9 (*)    All other components within normal limits  COMPREHENSIVE METABOLIC PANEL - Abnormal; Notable for the following components:   Sodium 131 (*)    Glucose, Bld 144 (*)    Calcium 8.7 (*)    All other components within normal limits  PROTIME-INR  APTT  DIFFERENTIAL  CBG MONITORING, ED   ____________________________________________   EKG  Interpreted by me Atrial fibrillation, rate of 87, normal axis and intervals.  Left bundle branch block.  No acute ischemic changes  ____________________________________________    RADIOLOGY  No results  found.  ____________________________________________   PROCEDURES Procedures  ____________________________________________    CLINICAL IMPRESSION / ASSESSMENT AND PLAN / ED COURSE  Medications  ordered in the ED: Medications - No data to display  Pertinent labs & imaging results that were available during my care of the patient were reviewed by me and considered in my medical decision making (see chart for details).  Erinn L Kimble was evaluated in Emergency Department on 08/01/2020 for the symptoms described in the history of present illness. He was evaluated in the context of the global COVID-19 pandemic, which necessitated consideration that the patient might be at risk for infection with the SARS-CoV-2 virus that causes COVID-19. Institutional protocols and algorithms that pertain to the evaluation of patients at risk for COVID-19 are in a state of rapid change based on information released by regulatory bodies including the CDC and federal and state organizations. These policies and algorithms were followed during the patient's care in the ED.   Patient presents with few episodes of vertigo.  No evidence of any acute cardiac event.  He is already anticoagulated for atrial fibrillation.  Doubt stroke or neurologic issue.  Lungs are clear, breathing unlabored.  This appears to be more anxiety related with his recent conflict with his landlord.  Will prescribe a trial of Vistaril, recommend close follow-up with PCP.      ____________________________________________   FINAL CLINICAL IMPRESSION(S) / ED DIAGNOSES    Final diagnoses:  Vertigo  Chronic congestive heart failure, unspecified heart failure type ([place])  Type 2 diabetes mellitus without complication, without long-term current use of insulin (HCC)  Atrial fibrillation, unspecified type [facility])     ED Discharge Orders         Ordered    hydrOXYzine (ATARAX/VISTARIL) 25 MG tablet  3 times daily PRN        08/01/20 1210           Portions of this note were generated with dragon dictation software. Dictation errors may occur despite best attempts at proofreading.   Carrie Mew, MD 08/01/20 1218

## 2020-08-01 NOTE — ED Triage Notes (Signed)
Pt in via EMS from home with c/o vertigo and pain to bilateral lower extremities. 150/30, 99% RA, HR 60's, ambulatory

## 2020-08-05 ENCOUNTER — Telehealth: Payer: Self-pay

## 2020-08-05 ENCOUNTER — Ambulatory Visit: Payer: Self-pay | Admitting: *Deleted

## 2020-08-05 NOTE — Telephone Encounter (Signed)
Copied from CRM [id number]. Topic: General - Inquiry >> Aug 05, 2020 12:48 PM Leafy Ro wrote: Reason for CRM: Pt is request to talk with brooke his social worker concerning his eviction notice. Pt is downsizing his property and pt just got out of the hospital.

## 2020-08-05 NOTE — Chronic Care Management (AMB) (Signed)
  Chronic Care Management   Social Work Note  08/05/2020 Name: Evan [name] MRN: [id number] DOB: 15-Mar-1956  Evan [name] is a 64 y.o. year old male who sees [place], Henrine Screws T, NP for primary care. The CCM team was consulted for assistance with [employer] .   Notification received that patient wanted to peak to social worker regarding his eviction. This Education officer, museum informed patient that this Education officer, museum was covering for  [employer], LCSW, however she has a scheduled phone appointment on 08/09/20.  SDOH (Social Determinants of Health) assessments performed: No     Outpatient Encounter Medications as of 08/05/2020  Medication Sig  . acetaminophen (TYLENOL) 500 MG tablet Take 500 mg by mouth every 6 (six) hours as needed.  Marland Kitchen aspirin 81 MG chewable tablet Chew 81 mg by mouth daily.  . Blood Glucose Monitoring Suppl (ONETOUCH VERIO) w/Device KIT Use to check blood sugar 3 to 4 times a day and document.  Please bring to visits for review.  . cholecalciferol (VITAMIN D3) 25 MCG (1000 UNIT) tablet Take 1 tablet (1,000 Units total) by mouth daily.  . Emollient (EUCERIN) lotion Apply topically as needed for dry skin. Apply to legs and arms daily to help with dry skin.  Marland Kitchen empagliflozin (JARDIANCE) 10 MG TABS tablet Take 1 tablet (10 mg total) by mouth daily.  . fluticasone (FLONASE) 50 MCG/ACT nasal spray Place 2 sprays into both nostrils daily.   . furosemide (LASIX) 40 MG tablet TAKE 2 TABLETS BY MOUTH ONCE EVERY MORNING AND 1 TABLET ONCE EVERY EVENING  . glucose blood (ONETOUCH VERIO) test strip Use to check blood sugar 3 to 4 times a day.  . hydrOXYzine (ATARAX/VISTARIL) 25 MG tablet Take 1 tablet (25 mg total) by mouth 3 (three) times daily as needed (dizziness or anxiety).  . Lancets (ONETOUCH ULTRASOFT) lancets Use to check blood sugar 3 to 4 times a day.  . lisinopril (ZESTRIL) 10 MG tablet Take 1 tablet (10 mg total) by mouth daily.  . metoprolol succinate (TOPROL XL) 50  MG 24 hr tablet Take 1 tablet (50 mg total) by mouth daily.  . nitroGLYCERIN (NITROSTAT) 0.4 MG SL tablet Place 1 tablet (0.4 mg total) under the tongue every 5 (five) minutes as needed for chest pain.  . Omega-3 Fatty Acids (OMEGA 3 500 PO) Take 500 mg by mouth daily.  . pantoprazole (PROTONIX) 40 MG tablet Take 1 tablet (40 mg total) by mouth daily.  . risperiDONE (RISPERDAL) 0.5 MG tablet Take 1 tablet (0.5 mg total) by mouth 2 (two) times daily.  . rivaroxaban (XARELTO) 20 MG TABS tablet Take 1 tablet (20 mg total) by mouth at bedtime.  . rosuvastatin (CRESTOR) 40 MG tablet Take 1 tablet (40 mg total) by mouth daily.   No facility-administered encounter medications on file as of 08/05/2020.    Goals Addressed   None     Follow Up Plan: Appointment scheduled for SW follow up with client by phone on: 08/09/20  Elliot Gurney, [place] [phone]

## 2020-08-09 ENCOUNTER — Telehealth: Payer: Medicare Other

## 2020-08-09 ENCOUNTER — Ambulatory Visit: Payer: Medicare Other | Admitting: Licensed Clinical Social Worker

## 2020-08-09 DIAGNOSIS — E1129 Type 2 diabetes mellitus with other diabetic kidney complication: Secondary | ICD-10-CM

## 2020-08-09 DIAGNOSIS — I25810 Atherosclerosis of coronary artery bypass graft(s) without angina pectoris: Secondary | ICD-10-CM

## 2020-08-09 DIAGNOSIS — I4819 Other persistent atrial fibrillation: Secondary | ICD-10-CM

## 2020-08-09 DIAGNOSIS — R809 Proteinuria, unspecified: Secondary | ICD-10-CM

## 2020-08-09 DIAGNOSIS — I5032 Chronic diastolic (congestive) heart failure: Secondary | ICD-10-CM

## 2020-08-09 DIAGNOSIS — J449 Chronic obstructive pulmonary disease, unspecified: Secondary | ICD-10-CM

## 2020-08-09 NOTE — Chronic Care Management (AMB) (Signed)
Chronic Care Management    Clinical Social Work Follow Up Note  08/09/2020 Name: Evan [name] MRN: [id number] DOB: November 30, 1955  Evan [name] is a 65 y.o. year old male who is a primary care patient of [name], [name], NP. The CCM team was consulted for assistance with [employer]  and Level of Care Concerns.   Review of patient status, including review of consultants reports, other relevant assessments, and collaboration with appropriate care team members and the patient's provider was performed as part of comprehensive patient evaluation and provision of chronic care management services.    SDOH (Social Determinants of Health) assessments performed: Yes    Outpatient Encounter Medications as of 08/09/2020  Medication Sig  . acetaminophen (TYLENOL) 500 MG tablet Take 500 mg by mouth every 6 (six) hours as needed.  Marland Kitchen aspirin 81 MG chewable tablet Chew 81 mg by mouth daily.  . Blood Glucose Monitoring Suppl (ONETOUCH VERIO) w/Device KIT Use to check blood sugar 3 to 4 times a day and document.  Please bring to visits for review.  . cholecalciferol (VITAMIN D3) 25 MCG (1000 UNIT) tablet Take 1 tablet (1,000 Units total) by mouth daily.  . Emollient (EUCERIN) lotion Apply topically as needed for dry skin. Apply to legs and arms daily to help with dry skin.  Marland Kitchen empagliflozin (JARDIANCE) 10 MG TABS tablet Take 1 tablet (10 mg total) by mouth daily.  . fluticasone (FLONASE) 50 MCG/ACT nasal spray Place 2 sprays into both nostrils daily.   . furosemide (LASIX) 40 MG tablet TAKE 2 TABLETS BY MOUTH ONCE EVERY MORNING AND 1 TABLET ONCE EVERY EVENING  . glucose blood (ONETOUCH VERIO) test strip Use to check blood sugar 3 to 4 times a day.  . hydrOXYzine (ATARAX/VISTARIL) 25 MG tablet Take 1 tablet (25 mg total) by mouth 3 (three) times daily as needed (dizziness or anxiety).  . Lancets (ONETOUCH ULTRASOFT) lancets Use to check blood sugar 3 to 4 times a day.  . lisinopril (ZESTRIL) 10 MG  tablet Take 1 tablet (10 mg total) by mouth daily.  . metoprolol succinate (TOPROL XL) 50 MG 24 hr tablet Take 1 tablet (50 mg total) by mouth daily.  . nitroGLYCERIN (NITROSTAT) 0.4 MG SL tablet Place 1 tablet (0.4 mg total) under the tongue every 5 (five) minutes as needed for chest pain.  . Omega-3 Fatty Acids (OMEGA 3 500 PO) Take 500 mg by mouth daily.  . pantoprazole (PROTONIX) 40 MG tablet Take 1 tablet (40 mg total) by mouth daily.  . risperiDONE (RISPERDAL) 0.5 MG tablet Take 1 tablet (0.5 mg total) by mouth 2 (two) times daily.  . rivaroxaban (XARELTO) 20 MG TABS tablet Take 1 tablet (20 mg total) by mouth at bedtime.  . rosuvastatin (CRESTOR) 40 MG tablet Take 1 tablet (40 mg total) by mouth daily.   No facility-administered encounter medications on file as of 08/09/2020.     Goals Addressed    . SW-Track and Manage My Symptoms-Depression       Timeframe:  Long-Range Goal Priority:  High Start Date:   08/09/20                         Expected End Date: 10/07/20                    Follow Up Date- 90 days from 08/09/20   - avoid negative self-talk - develop a personal safety plan - develop a  plan to deal with triggers like holidays, anniversaries - exercise at least 2 to 3 times per week - have a plan for how to handle bad days - journal feelings and what helps to feel better or worse - spend time or talk with others at least 2 to 3 times per week - spend time or talk with others every day - watch for early signs of feeling worse - write in journal every day    Why is this important?    Keeping track of your progress will help your treatment team find the right mix of medicine and therapy for you.   Write in your journal every day.   Day-to-day changes in depression symptoms are normal. It may be more helpful to check your progress at the end of each week instead of every day.     Notes:   Current Barriers:  . Financial constraints related to managing health  care . Limited social support . ADL IADL limitations . Social Isolation . Limited access to caregiver . Inability to perform ADL's independently . Inability to perform IADL's independently . Patient's current residence has been put at risk again due to hoarding through [employer]. He has received an eviction notice to leave the property on 08/20/20.  Clinical Social Work Clinical Goal(s):  Marland Kitchen Over the next 120 days, patient will work with SW to address concerns related to lack of support within the home . Over the next 120 days, patient will demonstrate improved health management independence as evidenced by implementing appropriate self-care into daily routine    Interventions: . Patient interviewed and appropriate assessments performed-new referral . Provided patient with information about level of care options, personal care service resources and Medicaid benefits.  . Patient reports that he is managing his health conditions well and is trying to make better healthy eating choices.  . Patient reports that he is still very active in drawing which helps to decrease his anxiety/stress.  . Discussed plans with patient for ongoing care management follow up and provided patient with direct contact information for care management team . Assisted patient/caregiver with obtaining information about health plan benefits . A voluntary and extensive discussion about advanced care planning including explanation and discussion of advanced was undertaken with the patient.  Explanation regarding healthcare proxy and living will was reviewed and packet with forms with explanation of how to fill them out was given.   . Provided education to patient/caregiver regarding level of care options. . Provided education to patient/caregiver about Hospice and/or Palliative Care services . Emotional support provided due to ongoing stressors. Solution focused therapy implemented into entire session  today. . Past update-LCSW received care coordination call from patient's [place]. She reports that program was unable to even get in the door at his yearly inspection due to hoarding and had to reschedule it. They gave patient a 30 day notice at this point. However, when they came back to complete the inspection, no progress had been made and he failed the inspection and is at risk for losing [employer]. LCSW typed a letter on behalf of patient to advocate for his situation considering his deliberating conditions and sent it to his caseworker. Patient had a hearing on 11/12/19 and they decided to allow patient another chance and did not discharge him from their program. However, patient must continue to work on his hoarding and meet their favorable living conditions requirements. LCSW will update entire team. UPDATE- Patient has  received another eviction notice from [employer] as they report that he continues to not meet their living requirements. [employer] informed patient that his hoarding continues to cause as a safety hazard but patient refuses to get rid of his belongings and has been advised to leave property by 08/20/20. Per [facility], there are NO current housing resources and they are no longer accepting housing assistance referrals at this time. CCM LCSW provided patient with one resource that he been assisting families with housing during [place]. This resource is: [employer] . Address: [facility], [address], [place], [place]. Phone: [phone] . Patient was advised to contact [employer] to seek shelter as patient declines ALF placement and pursues going to a shelter instead. Marland Kitchen CCM LCSW sent patient an email with updated housing resources with a list of shelters.  Marland Kitchen LCSW completed development of long term plan of care and institution of self health  management strategies with patient . Past update-LCSW FILED [facility]PS REPORT on 11/11/19 FOR SUSPECTED NEGLECT OF SELF AND INABILITY TO LIVE IN THE COMMUNITY INDEPENDENTLY. UPDATE -Patient is still involved with [facility]'s outpatient treatment program.  . Patient reports that he has started completing pod cast with friends which gives him a lot of purpose. Positive reinforcement provided for implementing appropriate self-care tools into his daily routine over the last 90 days.   Patient Self Care Activities:  . Attends all scheduled provider appointments . Lacks social connections  Please see past updates related to this goal by clicking on the "Past Updates" button in the selected goal        Follow Up Plan: SW will follow up with patient by phone over the next quarter  [name], [place], MSW, [email or web address] Phone: [phone]

## 2020-08-13 ENCOUNTER — Ambulatory Visit: Payer: Self-pay | Admitting: General Practice

## 2020-08-13 ENCOUNTER — Telehealth: Payer: Self-pay | Admitting: General Practice

## 2020-08-13 DIAGNOSIS — E1169 Type 2 diabetes mellitus with other specified complication: Secondary | ICD-10-CM

## 2020-08-13 DIAGNOSIS — I4819 Other persistent atrial fibrillation: Secondary | ICD-10-CM

## 2020-08-13 DIAGNOSIS — J449 Chronic obstructive pulmonary disease, unspecified: Secondary | ICD-10-CM

## 2020-08-13 DIAGNOSIS — F423 Hoarding disorder: Secondary | ICD-10-CM

## 2020-08-13 DIAGNOSIS — E1159 Type 2 diabetes mellitus with other circulatory complications: Secondary | ICD-10-CM

## 2020-08-13 DIAGNOSIS — F419 Anxiety disorder, unspecified: Secondary | ICD-10-CM

## 2020-08-13 DIAGNOSIS — E1129 Type 2 diabetes mellitus with other diabetic kidney complication: Secondary | ICD-10-CM

## 2020-08-13 DIAGNOSIS — I5032 Chronic diastolic (congestive) heart failure: Secondary | ICD-10-CM

## 2020-08-13 NOTE — Chronic Care Management (AMB) (Signed)
Chronic Care Management   Follow Up Note   08/13/2020 Name: Evan [name] MRN: [id number] DOB: Jun 30, 1956  Referred by: [name], NP Reason for referral : Chronic Care Management (RNCM: Follow up call for Chronic Disease Management and Care Coordination Needs )   Evan [name] is a 65 y.o. year old male who is a primary care patient of [name], [name], NP. The CCM team was consulted for assistance with chronic disease management and care coordination needs.    Review of patient status, including review of consultants reports, relevant laboratory and other test results, and collaboration with appropriate care team members and the patient's provider was performed as part of comprehensive patient evaluation and provision of chronic care management services.    SDOH (Social Determinants of Health) assessments performed: Yes See Care Plan activities for detailed interventions related to [facility])     Outpatient Encounter Medications as of 08/13/2020  Medication Sig   acetaminophen (TYLENOL) 500 MG tablet Take 500 mg by mouth every 6 (six) hours as needed.   aspirin 81 MG chewable tablet Chew 81 mg by mouth daily.   Blood Glucose Monitoring Suppl (ONETOUCH VERIO) w/Device KIT Use to check blood sugar 3 to 4 times a day and document.  Please bring to visits for review.   cholecalciferol (VITAMIN D3) 25 MCG (1000 UNIT) tablet Take 1 tablet (1,000 Units total) by mouth daily.   Emollient (EUCERIN) lotion Apply topically as needed for dry skin. Apply to legs and arms daily to help with dry skin.   empagliflozin (JARDIANCE) 10 MG TABS tablet Take 1 tablet (10 mg total) by mouth daily.   fluticasone (FLONASE) 50 MCG/ACT nasal spray Place 2 sprays into both nostrils daily.    furosemide (LASIX) 40 MG tablet TAKE 2 TABLETS BY MOUTH ONCE EVERY MORNING AND 1 TABLET ONCE EVERY EVENING   glucose blood (ONETOUCH VERIO) test strip Use to check blood sugar 3 to 4 times a day.    hydrOXYzine (ATARAX/VISTARIL) 25 MG tablet Take 1 tablet (25 mg total) by mouth 3 (three) times daily as needed (dizziness or anxiety).   Lancets (ONETOUCH ULTRASOFT) lancets Use to check blood sugar 3 to 4 times a day.   lisinopril (ZESTRIL) 10 MG tablet Take 1 tablet (10 mg total) by mouth daily.   metoprolol succinate (TOPROL XL) 50 MG 24 hr tablet Take 1 tablet (50 mg total) by mouth daily.   nitroGLYCERIN (NITROSTAT) 0.4 MG SL tablet Place 1 tablet (0.4 mg total) under the tongue every 5 (five) minutes as needed for chest pain.   Omega-3 Fatty Acids (OMEGA 3 500 PO) Take 500 mg by mouth daily.   pantoprazole (PROTONIX) 40 MG tablet Take 1 tablet (40 mg total) by mouth daily.   risperiDONE (RISPERDAL) 0.5 MG tablet Take 1 tablet (0.5 mg total) by mouth 2 (two) times daily.   rivaroxaban (XARELTO) 20 MG TABS tablet Take 1 tablet (20 mg total) by mouth at bedtime.   rosuvastatin (CRESTOR) 40 MG tablet Take 1 tablet (40 mg total) by mouth daily.   No facility-administered encounter medications on file as of 08/13/2020.     Objective:   Goals Addressed            This Visit's Progress    RNCM: I have a lot of health problems but I manage okay       CARE PLAN ENTRY (see longtitudinal plan of care for additional care plan information)  Current Barriers:   Chronic  Disease Management support, education, and care coordination needs related to Atrial Fibrillation, CHF, CAD, HTN, HLD, DMII, Anxiety, and Depression  Clinical Goal(s) related to Atrial Fibrillation, CHF, CAD, HTN, HLD, DMII, Anxiety, and Depression:  Over the next 120 days, patient will:   Work with the care management team to address educational, disease management, and care coordination needs   Begin or continue self health monitoring activities as directed today  Measure and record blood pressure 3 times per week, and Measure and record weight daily, DMII under control at this time as evidence by Hemoglobin A1C at  6.6 and last fasting blood sugar on labs of 113  Call provider office for new or worsened signs and symptoms related to Hypoglycemia or Hyperglycemia, Blood pressure findings outside established parameters, Weight outside established parameters, and New or worsened symptom related to Chronic Health Conditions  Call care management team with questions or concerns  Verbalize basic understanding of patient centered plan of care established today  Interventions related to Atrial Fibrillation, CHF, CAD, HTN, HLD, DMII, Anxiety, and Depression:   Evaluation of current treatment plans and patient's adherence to plan as established by provider. 04-28-2020: The patient is doing well.  The patient saw the pcp on 04-08-2020 and had lab work. Review of cholesterol levels and potassium levels. 08-13-2020: The patient says everything is going well but he is upset because they are trying to evict him from him home. He was seen in the ER on 08-01-2020. Denies any issues from dizziness. States that he is having headaches but states that is from being stressed out.   Assessed patient understanding of disease states.  08-13-2020:  The patient verbalized understanding of his chronic conditions. The patient verbalized compliance with his pill packs and states these are working well for him. Review of cholesterol medication.  The patient endorses taking medications as prescribed.  Will continue to monitor.   Assessed patient's education and care coordination needs- discussed taking blood pressures (patient does not have a cuff- discussed the OTC product book through [facility] and the monitoring program through [facility]).  08-13-2020:  The patient is still not checking his blood pressure at home. States when he goes to the doctor it is good. The patient denies any issues with blood pressure at this time. Will continue to monitor.   Assessed the patient's need for blood glucose testing on a daily basis.  04-28-2020: The patient's most recent  hemoglobin A1C is 6.2 on 04-08-2020 and fasting blood sugar are WNL. Will continue to monitor for changes. 08-13-2020: The patient states that his blood sugars are about 120.  He takes his blood sugar every other day.   Education to the patient about calling [facility] Customer service line to ask about [place] life alert system or comparable system since he lives alone  08-13-2020: The patient inquired again about a [place] life line. Information given on calling [place] to ask about the benefit for [place] life alert system.   Provided disease specific education to patient- education on the benefits of daily monitoring of VS and daily weight checks to maintain health and well being.  04-28-2020: The patient still does not weigh daily or check VS. States that he has lost weight and now weighs 232  He denies any issues with edema. Education on watching for water weight and notifying provider for weight gain and fluid overload. 08-13-2020: Denies any issues with edema and swelling. Weights not provided today as the patient was concerned about his eviction from  his home and appealing this.   Education on DASH diet and ADA diet- written information provided through the myChart function and EMMI.  04-28-2020: The patient endorses eating well and watching his sodium content. The patient states that he has a Medical sales representative and had it stocked well with food. Denies any needs for food resources at this time. Review of foods high in potassium. The patient verbalized that he had been eating a lot of bananas before his visit to see pcp on 04-08-2020. Also reviewed that potatoes, spinach, mangoes could also increase potassium levels and to drink more water per recommendations of pcp. Review of dietary habits and eating a lot of fried foods and foods high in cholesterol. The patient states he uses canola oil mainly. Education on healthy options and lowering cholesterol levels. 08-13-2020: The patient states he is eating well and  following a Heart Healthy/ADA diet.   Evaluation of the patients multiple medical conditions. 04-28-2020: The patient denies any health concerns related to his chronic conditions of AF/CHF/CAD, HTN, HLD and DMII.  The patient feels he is doing a good job at managing his care at this time. The patient was calm and actually had to end the call to receive another call. The patient has a new ipad and is trying to keep track of his health, appointments and other things. The patient endorses taking medications as directed. The patient is enjoying drawing and this is keeping him busy. He was up late last night with friends but is doing well. 08-13-2020: The patient states he is doing well managing his care but he is dealing with housing issues and got an eviction notice during Christmas. He has someone helping him with an appeal. The patient is also working with LCSW.   Collaborated with appropriate clinical care team members regarding patient needs- patient is already involved with LCSW and Pharmacist, will continue to monitor for changes in condition and support. 02-06-2020: Supplied the numbers to the CCM team to the patient as he had an issue with his telephone and had to get a new one. It deleted his phone numbers.  The patient has contact information for the CCM team now.   Evaluation of upcoming appointments. Next appointment with pcp on 10-06-2020 at 11 am.   Patient Self Care Activities related to Atrial Fibrillation, CHF, CAD, HTN, HLD, DMII, Anxiety, and Depression:   Patient is unable to independently self-manage chronic health conditions  Please see past updates related to this goal by clicking on the "Past Updates" button in the selected goal         Patient Care Plan: General Social Work (Adult)    Problem Identified: Response to Treatment (Depression)     Goal: Response to Treatment Maximized   Note:   Evidence-based guidance:   Engage patient in conversation about the perceived benefits of  mental health treatment and quality of therapeutic alliance with his/her mental health professional.   Assess for barriers to attending appointments, such as transportation, financial, sense of slow or little improvement and forgetfulness.   Consider patient resistance to treatment based on stigma related to mental health diagnosis.   Maintain a documented system of ongoing contacts with patient during the first 6 to 12 months of treatment, as missed appointments and disengagement may signal deteriorating condition.   Provide anticipatory guidance about the risk of increased symptoms and potential psychiatric hospitalization for those who have a pattern of nonattendance at mental health appointments.   Re-screen for depressive symptoms at mutually  identified intervals.   Notes:    Task: Facilitate Engagement in Mental Health Services   Note:   Care Management Activities:    - attendance at mental health appointments reviewed - risk of unmanaged depression discussed    Notes:      Plan:   Telephone follow up appointment with care management team member scheduled for: 10-15-2020 at 0900 am   Noreene Larsson RN, MSN, [facility] Mobile: [phone]

## 2020-08-13 NOTE — Patient Instructions (Signed)
Visit Information  Goals Addressed            This Visit's Progress   . RNCM: I have a lot of health problems but I manage okay       CARE PLAN ENTRY (see longtitudinal plan of care for additional care plan information)  Current Barriers:  . Chronic Disease Management support, education, and care coordination needs related to Atrial Fibrillation, CHF, CAD, HTN, HLD, DMII, Anxiety, and Depression  Clinical Goal(s) related to Atrial Fibrillation, CHF, CAD, HTN, HLD, DMII, Anxiety, and Depression:  Over the next 120 days, patient will:  . Work with the care management team to address educational, disease management, and care coordination needs  . Begin or continue self health monitoring activities as directed today  Measure and record blood pressure 3 times per week, and Measure and record weight daily, DMII under control at this time as evidence by Hemoglobin A1C at 6.6 and last fasting blood sugar on labs of 113 . Call provider office for new or worsened signs and symptoms related to Hypoglycemia or Hyperglycemia, Blood pressure findings outside established parameters, Weight outside established parameters, and New or worsened symptom related to Chronic Health Conditions . Call care management team with questions or concerns . Verbalize basic understanding of patient centered plan of care established today  Interventions related to Atrial Fibrillation, CHF, CAD, HTN, HLD, DMII, Anxiety, and Depression:  . Evaluation of current treatment plans and patient's adherence to plan as established by provider. 04-28-2020: The patient is doing well.  The patient saw the pcp on 04-08-2020 and had lab work. Review of cholesterol levels and potassium levels. 08-13-2020: The patient says everything is going well but he is upset because they are trying to evict him from him home. He was seen in the ER on 08-01-2020. Denies any issues from dizziness. States that he is having headaches but states that is from being  stressed out.  . Assessed patient understanding of disease states.  08-13-2020:  The patient verbalized understanding of his chronic conditions. The patient verbalized compliance with his pill packs and states these are working well for him. Review of cholesterol medication.  The patient endorses taking medications as prescribed.  Will continue to monitor.  . Assessed patient's education and care coordination needs- discussed taking blood pressures (patient does not have a cuff- discussed the OTC product book through [facility] and the monitoring program through [facility]).  08-13-2020:  The patient is still not checking his blood pressure at home. States when he goes to the doctor it is good. The patient denies any issues with blood pressure at this time. Will continue to monitor.  . Assessed the patient's need for blood glucose testing on a daily basis.  04-28-2020: The patient's most recent hemoglobin A1C is 6.2 on 04-08-2020 and fasting blood sugar are WNL. Will continue to monitor for changes. 08-13-2020: The patient states that his blood sugars are about 120.  He takes his blood sugar every other day.  . Education to the patient about calling [facility] Customer service line to ask about [place] life alert system or comparable system since he lives alone  08-13-2020: The patient inquired again about a [place] life line. Information given on calling [place] to ask about the benefit for [place] life alert system.  . Provided disease specific education to patient- education on the benefits of daily monitoring of VS and daily weight checks to maintain health and well being.  04-28-2020: The patient still does not  weigh daily or check VS. States that he has lost weight and now weighs 232  He denies any issues with edema. Education on watching for water weight and notifying provider for weight gain and fluid overload. 08-13-2020: Denies any issues with edema and swelling. Weights not provided today as the patient was concerned  about his eviction from his home and appealing this.  . Education on [place] and ADA diet- written information provided through the myChart function and EMMI.  04-28-2020: The patient endorses eating well and watching his sodium content. The patient states that he has a Medical sales representative and had it stocked well with food. Denies any needs for food resources at this time. Review of foods high in potassium. The patient verbalized that he had been eating a lot of bananas before his visit to see pcp on 04-08-2020. Also reviewed that potatoes, spinach, mangoes could also increase potassium levels and to drink more water per recommendations of pcp. Review of dietary habits and eating a lot of fried foods and foods high in cholesterol. The patient states he uses canola oil mainly. Education on healthy options and lowering cholesterol levels. 08-13-2020: The patient states he is eating well and following a Heart Healthy/ADA diet.  . Evaluation of the patients multiple medical conditions. 04-28-2020: The patient denies any health concerns related to his chronic conditions of AF/CHF/CAD, HTN, HLD and DMII.  The patient feels he is doing a good job at managing his care at this time. The patient was calm and actually had to end the call to receive another call. The patient has a new ipad and is trying to keep track of his health, appointments and other things. The patient endorses taking medications as directed. The patient is enjoying drawing and this is keeping him busy. He was up late last night with friends but is doing well. 08-13-2020: The patient states he is doing well managing his care but he is dealing with housing issues and got an eviction notice during Christmas. He has someone helping him with an appeal. The patient is also working with LCSW.  Nash Dimmer with appropriate clinical care team members regarding patient needs- patient is already involved with LCSW and Pharmacist, will continue to monitor for changes in condition  and support. 02-06-2020: Supplied the numbers to the CCM team to the patient as he had an issue with his telephone and had to get a new one. It deleted his phone numbers.  The patient has contact information for the CCM team now.  . Evaluation of upcoming appointments. Next appointment with pcp on 10-06-2020 at 11 am.   Patient Self Care Activities related to Atrial Fibrillation, CHF, CAD, HTN, HLD, DMII, Anxiety, and Depression:  . Patient is unable to independently self-manage chronic health conditions  Please see past updates related to this goal by clicking on the "Past Updates" button in the selected goal         The patient verbalized understanding of instructions, educational materials, and care plan provided today and declined offer to receive copy of patient instructions, educational materials, and care plan.   Telephone follow up appointment with care management team member scheduled for: 10-15-2020 at 0900 am  Noreene Larsson RN, MSN, [facility] Mobile: [phone]

## 2020-08-23 ENCOUNTER — Telehealth: Payer: Self-pay | Admitting: Nurse Practitioner

## 2020-08-23 ENCOUNTER — Telehealth: Payer: Self-pay

## 2020-08-23 NOTE — Telephone Encounter (Signed)
Copied from [place] [phone]. Topic: General - Other >> Aug 23, 2020  1:53 PM Antonieta Iba C wrote: Reason for CRM: [name]- pt home health nurse is calling in for assistance. She says that pt is desperate for help. Pt went to court due to being evicted. Home health nurse says that pt has physical and mental limitations that prevent him from having to move. She would like assistance with medical records to provide to the judge. She says that she can bring pt in to sign a release form to obtain records.    CB: [phone] -- [name] -pt's home health assistant.

## 2020-08-23 NOTE — Telephone Encounter (Signed)
Herby Abraham MCDOW- with [employer] is calling to request some assistance with the patient. Patient is about to be evicted. Requesting some help because the patient has psychial and mental limitations. Cb- [phone]

## 2020-08-24 ENCOUNTER — Ambulatory Visit: Payer: Self-pay | Admitting: Licensed Clinical Social Worker

## 2020-08-24 DIAGNOSIS — Z599 Problem related to housing and economic circumstances, unspecified: Secondary | ICD-10-CM

## 2020-08-24 NOTE — Chronic Care Management (AMB) (Signed)
  Care Management   Follow Up Note   08/24/2020 Name: Evan [name] MRN: [id number] DOB: 1955/09/14  Referred by: [name], NP Reason for referral : Bartelso is a 65 y.o. year old male who is a primary care patient of [name], [name], NP. The care management team was consulted for assistance with care management and care coordination needs.    Review of patient status, including review of consultants reports, relevant laboratory and other test results, and collaboration with appropriate care team members and the patient's provider was performed as part of comprehensive patient evaluation and provision of chronic care management services.    CCM LCSW received message from PCP regarding patient in need of crisis support resource education. CCM LCSW has already provided patient with list of low incoming housing, public housing wait list and shelter information but C3 team made be able to provide further conmmunity resource assistance. CCM LCSW completed C3 Guide referral on 08/24/20 for this specific request. CCM LCSW will update PCP.  [name], BSW, MSW, [facility] Care Management [email or web address] Phone: [phone]

## 2020-08-24 NOTE — Telephone Encounter (Signed)
Will alert Jerene Pitch as she has been working with patient on this and the C4 team.  Thanks.

## 2020-08-24 NOTE — Telephone Encounter (Signed)
Do you have any recommendations?

## 2020-08-25 ENCOUNTER — Telehealth: Payer: Self-pay

## 2020-08-25 NOTE — Chronic Care Management (AMB) (Deleted)
Chronic Care Management Pharmacy  Name: Evan [name]  MRN: [id number] DOB: 1955/09/02   Chief Complaint/ HPI  Evan [name],  65 y.o. , male presents for their Follow-Up CCM visit with the clinical pharmacist via telephone due to COVID-19 Pandemic.  PCP : [name], NP Patient Care Team: [name], NP as PCP - General (Nurse Practitioner) Alisa Graff, FNP as Nurse Practitioner (Cardiology) Minna Merritts, MD as Consulting Physician (Cardiology) [name], RN as Case Manager ([place]) [name], LCSW as Social Worker (Licensed Clinical Social Worker) [name], [facility] as Pharmacist (Pharmacist)  Their chronic conditions include: Hypertension, Hyperlipidemia, Diabetes, Heart Failure, Coronary Artery Disease, GERD and Anxiety   Office Visits: 04/08/20- [name], NP - blood work, change omeprazole to pantoprazole  Consult Visit: 08/01/20- [facility] ED - vertigo, hydroxyzone 25 mg tid 03/31/20 - [name], cardiology - BP 172/96  Allergies  Allergen Reactions  . Penicillin G Hives  . Sulfa Antibiotics Hives  . Tiotropium   . Zoloft [Sertraline Hcl] Other (See Comments)    Medications: Outpatient Encounter Medications as of 08/25/2020  Medication Sig  . acetaminophen (TYLENOL) 500 MG tablet Take 500 mg by mouth every 6 (six) hours as needed.  Marland Kitchen aspirin 81 MG chewable tablet Chew 81 mg by mouth daily.  . Blood Glucose Monitoring Suppl (ONETOUCH VERIO) w/Device KIT Use to check blood sugar 3 to 4 times a day and document.  Please bring to visits for review.  . cholecalciferol (VITAMIN D3) 25 MCG (1000 UNIT) tablet Take 1 tablet (1,000 Units total) by mouth daily.  . Emollient (EUCERIN) lotion Apply topically as needed for dry skin. Apply to legs and arms daily to help with dry skin.  Marland Kitchen empagliflozin (JARDIANCE) 10 MG TABS tablet Take 1 tablet (10 mg total) by mouth daily.  . fluticasone (FLONASE) 50 MCG/ACT nasal spray  Place 2 sprays into both nostrils daily.   . furosemide (LASIX) 40 MG tablet TAKE 2 TABLETS BY MOUTH ONCE EVERY MORNING AND 1 TABLET ONCE EVERY EVENING  . glucose blood (ONETOUCH VERIO) test strip Use to check blood sugar 3 to 4 times a day.  . hydrOXYzine (ATARAX/VISTARIL) 25 MG tablet Take 1 tablet (25 mg total) by mouth 3 (three) times daily as needed (dizziness or anxiety).  . Lancets (ONETOUCH ULTRASOFT) lancets Use to check blood sugar 3 to 4 times a day.  . lisinopril (ZESTRIL) 10 MG tablet Take 1 tablet (10 mg total) by mouth daily.  . metoprolol succinate (TOPROL XL) 50 MG 24 hr tablet Take 1 tablet (50 mg total) by mouth daily.  . nitroGLYCERIN (NITROSTAT) 0.4 MG SL tablet Place 1 tablet (0.4 mg total) under the tongue every 5 (five) minutes as needed for chest pain.  . Omega-3 Fatty Acids (OMEGA 3 500 PO) Take 500 mg by mouth daily.  . pantoprazole (PROTONIX) 40 MG tablet Take 1 tablet (40 mg total) by mouth daily.  . risperiDONE (RISPERDAL) 0.5 MG tablet Take 1 tablet (0.5 mg total) by mouth 2 (two) times daily.  . rivaroxaban (XARELTO) 20 MG TABS tablet Take 1 tablet (20 mg total) by mouth at bedtime.  . rosuvastatin (CRESTOR) 40 MG tablet Take 1 tablet (40 mg total) by mouth daily.   No facility-administered encounter medications on file as of 08/25/2020.    Wt Readings from Last 3 Encounters:  08/01/20 239 lb (108.4 kg)  07/08/20 235 lb 9.6 oz (106.9 kg)  04/08/20 233  lb (105.7 kg)    Current Diagnosis/Assessment:    Goals Addressed   None     Hypertension/CHF   BP goal is:  <130/80  Office blood pressures are  BP Readings from Last 3 Encounters:  08/01/20 (!) 144/77  07/08/20 134/84  04/08/20 128/86   Patient checks BP at home infrequently Patient home BP readings are ranging: has not been documenting but states readings have been "good"  Patient has failed these meds in the past: not available in chart Patient is currently controlled on the following  medications:  . Furosemide 40 mg bid . Lisinopril 10 mg qd . toprol Xl 50 mg qd . Nitroglycerin 0.4 mg prn  We discussed diet and exercise extensively. Reports he has gotten a care giver and they have been walking several days per week.He has been able to walk without his cane. Reports losing weight. States he has been taking his BP at home but doesn't know the readings only that they have been "good". Encouraged to document and provide at future appointments.   Plan  Continue current medications     Diabetes   A1c goal <7%  Recent Relevant Labs: Lab Results  Component Value Date/Time   HGBA1C 6.3 07/08/2020 10:00 AM   HGBA1C 6.2 04/08/2020 09:18 AM   GFR 98.05 09/12/2013 10:35 AM   GFR 98.79 07/26/2011 12:34 PM   MICROALBUR 80 (H) 09/09/2019 10:47 AM    Last diabetic Eye exam:  Lab Results  Component Value Date/Time   HMDIABEYEEXA Retinopathy (A) 01/14/2019 12:00 AM    Last diabetic Foot exam: No results found for: HMDIABFOOTEX   Checking BG: Never.  Reports not checking at home because his sugar is controlled.   Patient has failed these meds in past: Metfromin Patient is currently controlled on the following medications: . Jardiance 10 mg qd  We discussed: diet and exercise extensively and how to recognize and treat signs of hypoglycemia. Reports he is cooking his meals with his pressure cooker. He does not check glucose at home. Denies any symptoms of hypoglycemia.  Plan  Continue current medications      Medication Management   Patient is requesting refill for Eucerin cream reporting his feet are scaly again. Will request from PCP.  Pt uses [facility] for all medications Uses pill box? Packaging and delivery  Pt denies any missed medications.  Plan  Continue current medication management strategy    Follow up: 3 month phone visit  Junita Push. Kenton Kingfisher PharmD, [facility] [phone]

## 2020-08-28 ENCOUNTER — Emergency Department
Admission: EM | Admit: 2020-08-28 | Discharge: 2020-08-28 | Disposition: A | Discharge status: Home / Self Care | Payer: Medicare Other | Attending: Emergency Medicine | Admitting: Emergency Medicine

## 2020-08-28 ENCOUNTER — Emergency Department: Payer: Medicare Other

## 2020-08-28 ENCOUNTER — Other Ambulatory Visit: Payer: Self-pay

## 2020-08-28 DIAGNOSIS — Z7984 Long term (current) use of oral hypoglycemic drugs: Secondary | ICD-10-CM | POA: Insufficient documentation

## 2020-08-28 DIAGNOSIS — I5032 Chronic diastolic (congestive) heart failure: Secondary | ICD-10-CM | POA: Diagnosis not present

## 2020-08-28 DIAGNOSIS — J449 Chronic obstructive pulmonary disease, unspecified: Secondary | ICD-10-CM | POA: Insufficient documentation

## 2020-08-28 DIAGNOSIS — J45909 Unspecified asthma, uncomplicated: Secondary | ICD-10-CM | POA: Insufficient documentation

## 2020-08-28 DIAGNOSIS — Z79899 Other long term (current) drug therapy: Secondary | ICD-10-CM | POA: Insufficient documentation

## 2020-08-28 DIAGNOSIS — I2510 Atherosclerotic heart disease of native coronary artery without angina pectoris: Secondary | ICD-10-CM | POA: Diagnosis not present

## 2020-08-28 DIAGNOSIS — R600 Localized edema: Secondary | ICD-10-CM | POA: Diagnosis not present

## 2020-08-28 DIAGNOSIS — Z955 Presence of coronary angioplasty implant and graft: Secondary | ICD-10-CM | POA: Diagnosis not present

## 2020-08-28 DIAGNOSIS — I739 Peripheral vascular disease, unspecified: Secondary | ICD-10-CM | POA: Insufficient documentation

## 2020-08-28 DIAGNOSIS — L853 Xerosis cutis: Secondary | ICD-10-CM | POA: Insufficient documentation

## 2020-08-28 DIAGNOSIS — Z8546 Personal history of malignant neoplasm of prostate: Secondary | ICD-10-CM | POA: Insufficient documentation

## 2020-08-28 DIAGNOSIS — E11319 Type 2 diabetes mellitus with unspecified diabetic retinopathy without macular edema: Secondary | ICD-10-CM | POA: Insufficient documentation

## 2020-08-28 DIAGNOSIS — Z7982 Long term (current) use of aspirin: Secondary | ICD-10-CM | POA: Diagnosis not present

## 2020-08-28 DIAGNOSIS — R58 Hemorrhage, not elsewhere classified: Secondary | ICD-10-CM | POA: Diagnosis not present

## 2020-08-28 DIAGNOSIS — I110 Hypertensive heart disease with heart failure: Secondary | ICD-10-CM | POA: Insufficient documentation

## 2020-08-28 DIAGNOSIS — I83891 Varicose veins of right lower extremities with other complications: Secondary | ICD-10-CM | POA: Insufficient documentation

## 2020-08-28 DIAGNOSIS — Z951 Presence of aortocoronary bypass graft: Secondary | ICD-10-CM | POA: Diagnosis not present

## 2020-08-28 DIAGNOSIS — M79661 Pain in right lower leg: Secondary | ICD-10-CM | POA: Diagnosis not present

## 2020-08-28 DIAGNOSIS — Z7951 Long term (current) use of inhaled steroids: Secondary | ICD-10-CM | POA: Diagnosis not present

## 2020-08-28 DIAGNOSIS — I83899 Varicose veins of unspecified lower extremities with other complications: Secondary | ICD-10-CM

## 2020-08-28 DIAGNOSIS — M79604 Pain in right leg: Secondary | ICD-10-CM | POA: Diagnosis not present

## 2020-08-28 DIAGNOSIS — Z743 Need for continuous supervision: Secondary | ICD-10-CM | POA: Diagnosis not present

## 2020-08-28 LAB — COMPREHENSIVE METABOLIC PANEL
ALT: 16 U/L (ref 0–44)
AST: 20 U/L (ref 15–41)
Albumin: 4 g/dL (ref 3.5–5.0)
Alkaline Phosphatase: 74 U/L (ref 38–126)
Anion gap: 11 (ref 5–15)
BUN: 20 mg/dL (ref 8–23)
CO2: 23 mmol/L (ref 22–32)
Calcium: 9.2 mg/dL (ref 8.9–10.3)
Chloride: 104 mmol/L (ref 98–111)
Creatinine, Ser: 0.81 mg/dL (ref 0.61–1.24)
GFR, Estimated: >60 mL/min (ref >60)
Glucose, Bld: 146 mg/dL — ABNORMAL HIGH (ref 70–99)
Potassium: 4 mmol/L (ref 3.5–5.1)
Sodium: 138 mmol/L (ref 135–145)
Total Bilirubin: 0.8 mg/dL (ref 0.3–1.2)
Total Protein: 7.9 g/dL (ref 6.5–8.1)

## 2020-08-28 LAB — CBC WITH DIFFERENTIAL/PLATELET
Abs Immature Granulocytes: 0.03 10*3/uL (ref 0.00–0.07)
Basophils Absolute: 0.1 10*3/uL (ref 0.0–0.1)
Basophils Relative: 1 %
Eosinophils Absolute: 0.1 10*3/uL (ref 0.0–0.5)
Eosinophils Relative: 1 %
HCT: 46 % (ref 39.0–52.0)
Hemoglobin: 14.6 g/dL (ref 13.0–17.0)
Immature Granulocytes: 0 %
Lymphocytes Relative: 11 %
Lymphs Abs: 1.1 10*3/uL (ref 0.7–4.0)
MCH: 27.4 pg (ref 26.0–34.0)
MCHC: 31.7 g/dL (ref 30.0–36.0)
MCV: 86.5 fL (ref 80.0–100.0)
Monocytes Absolute: 0.6 10*3/uL (ref 0.1–1.0)
Monocytes Relative: 6 %
Neutro Abs: 8.5 10*3/uL — ABNORMAL HIGH (ref 1.7–7.7)
Neutrophils Relative %: 81 %
Platelets: 261 10*3/uL (ref 150–400)
RBC: 5.32 MIL/uL (ref 4.22–5.81)
RDW: 16 % — ABNORMAL HIGH (ref 11.5–15.5)
WBC: 10.4 10*3/uL (ref 4.0–10.5)
nRBC: 0 % (ref 0.0–0.2)

## 2020-08-28 LAB — PROTIME-INR
INR: 1 (ref 0.8–1.2)
Prothrombin Time: 13 seconds (ref 11.4–15.2)

## 2020-08-28 IMAGING — US US EXTREM LOW VENOUS*R*
1 series · 14 of 24 positions shown · non-contrast
Comparison: None.

CLINICAL DATA: Right lower extremity pain and edema.

EXAM:
RIGHT LOWER EXTREMITY VENOUS DOPPLER ULTRASOUND
TECHNIQUE: Gray-scale sonography with compression, as well as color and duplex
ultrasound, were performed to evaluate the deep venous system(s)
from the level of the common femoral vein through the popliteal and
proximal calf veins.

[Series 1: us venous img lower uni right (dvt) · portal-venous · 14 of 36 slices shown]
[im 1/36]
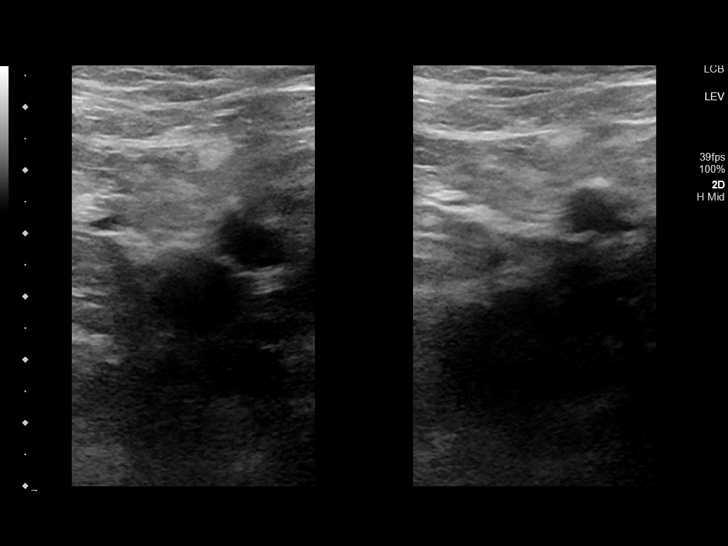
[im 4/36]
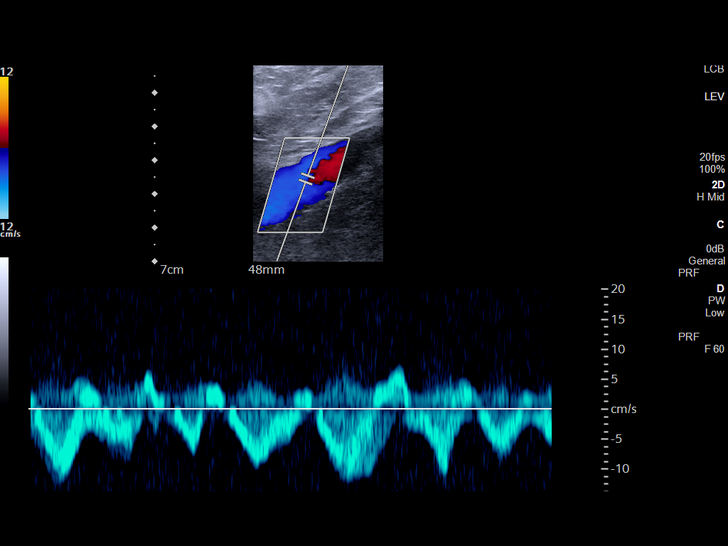
[im 7/36]
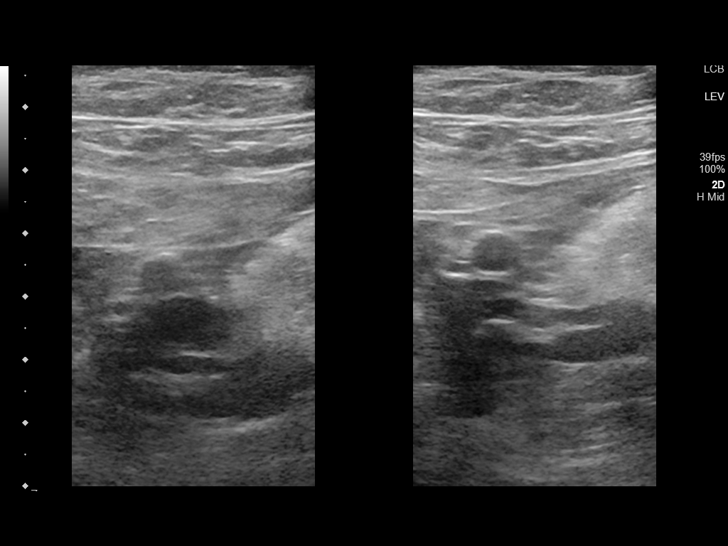
[im 10/36]
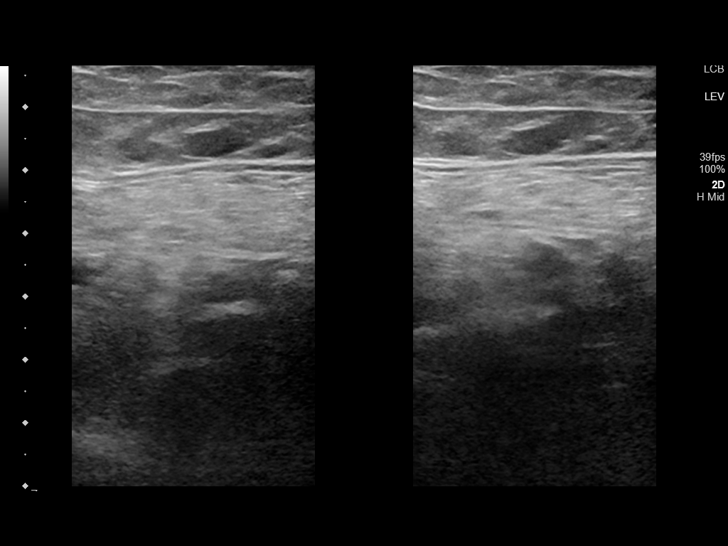
[im 11/36]
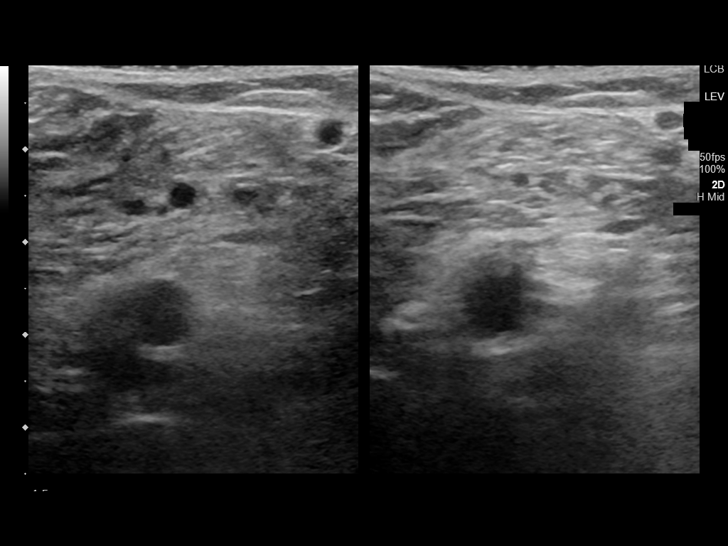
[im 14/36]
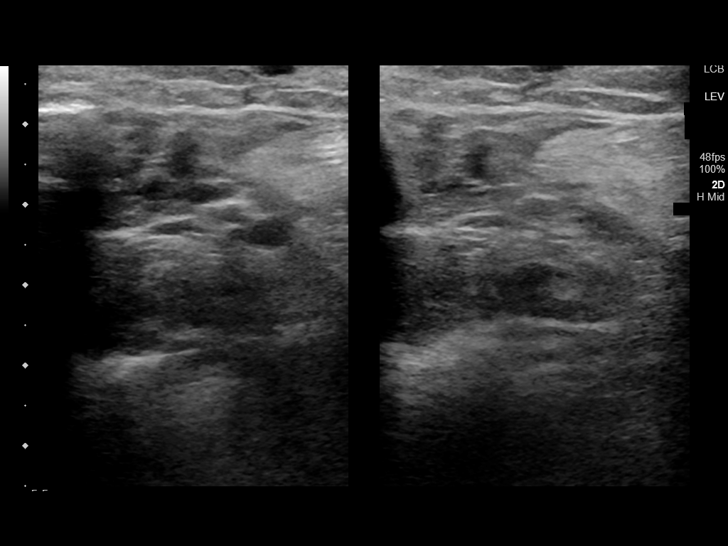
[im 17/36]
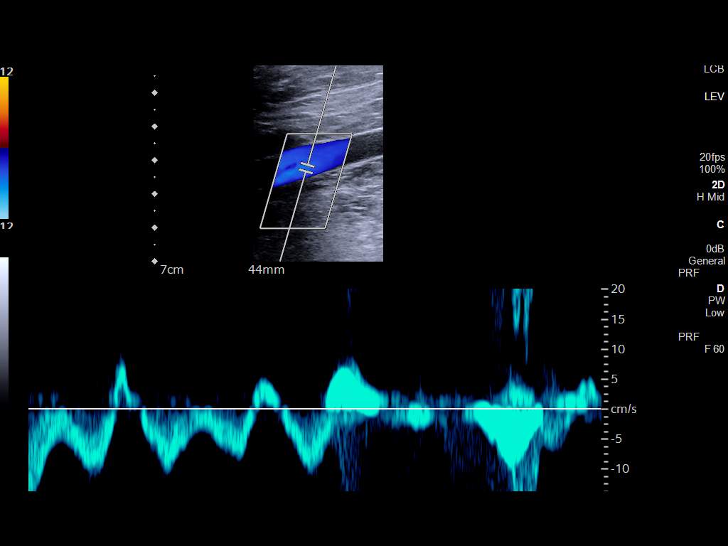
[im 19/36]
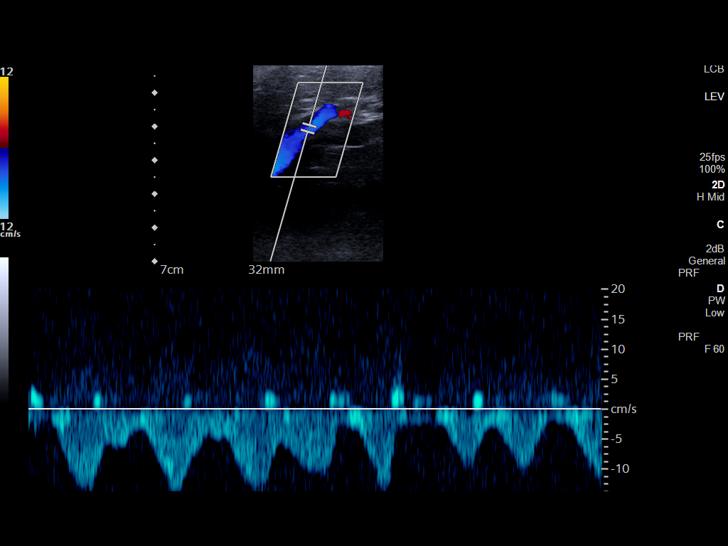
[im 22/36]
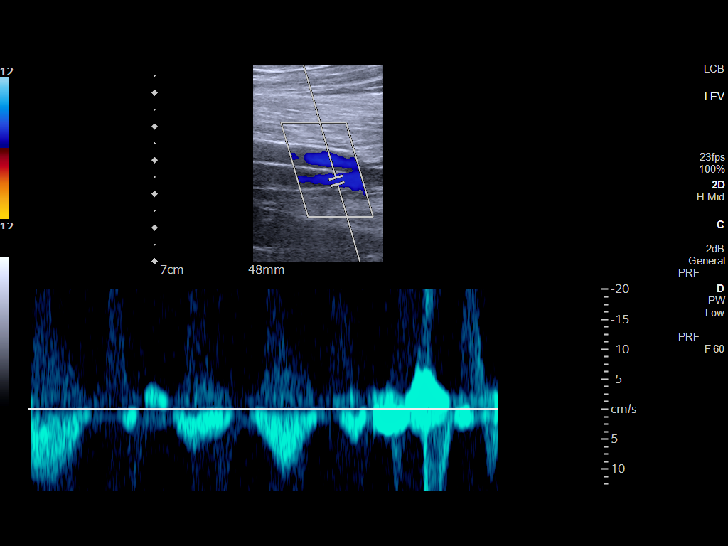
[im 25/36]
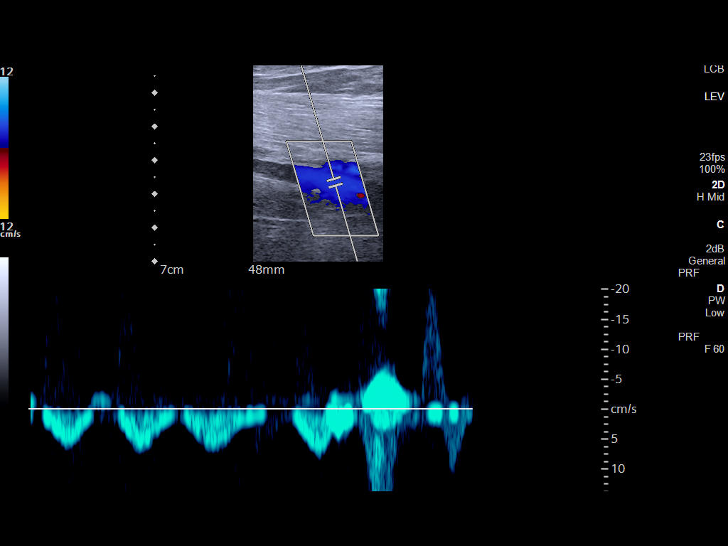
[im 28/36]
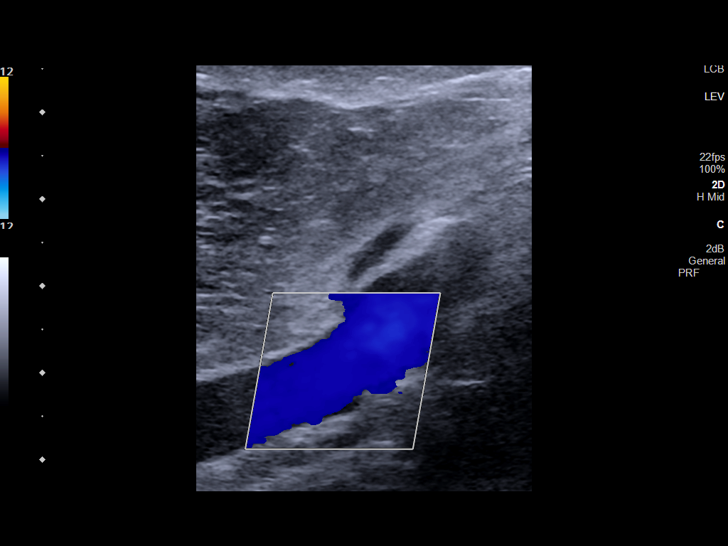
[im 29/36]
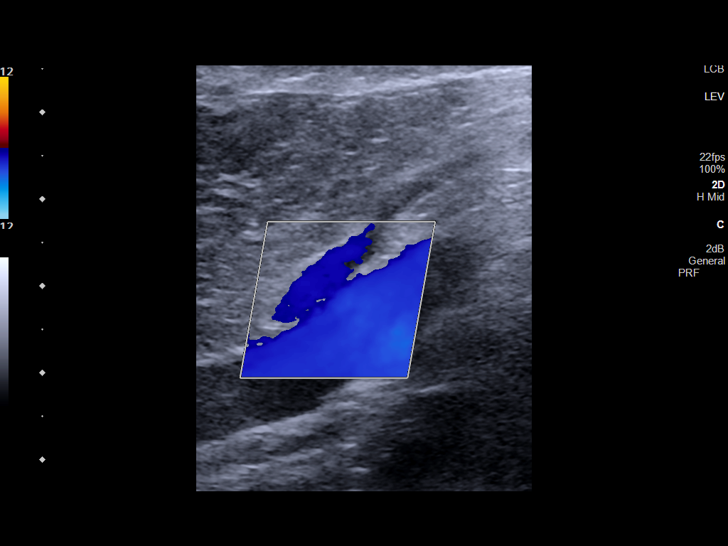
[im 32/36]
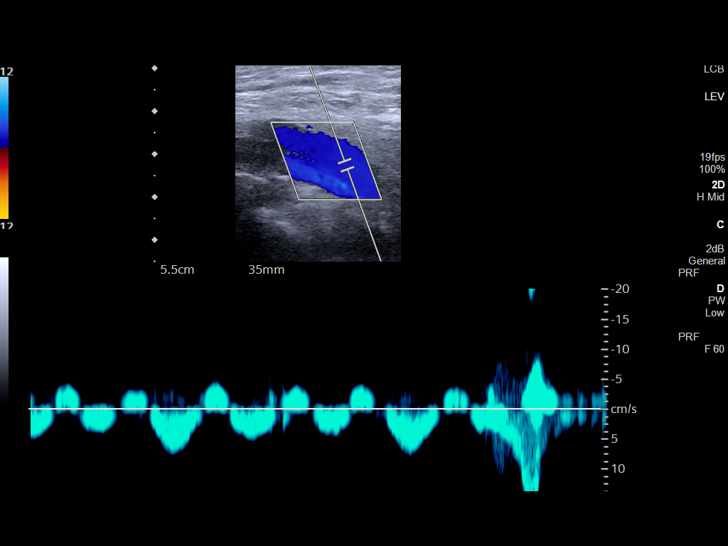
[im 36/36]
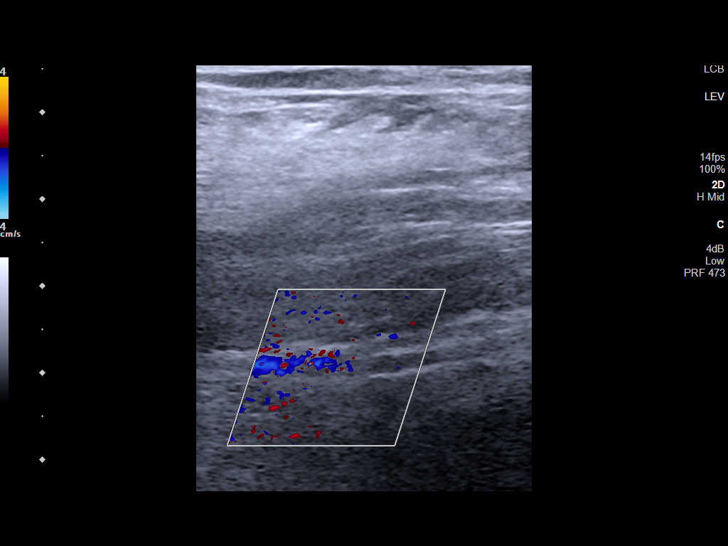

[14 of 24 positions shown; findings below may reference images not displayed]

FINDINGS: VENOUS

Normal compressibility of the common femoral, superficial femoral,
and popliteal veins, as well as the visualized calf veins.
Visualized portions of profunda femoral vein and great saphenous
vein unremarkable. No filling defects to suggest DVT on grayscale or
color Doppler imaging. Doppler waveforms show normal direction of
venous flow, normal respiratory plasticity and response to
augmentation.

Limited views of the contralateral common femoral vein are
unremarkable.

OTHER

None.

Limitations: none
IMPRESSION: Negative.

## 2020-08-28 MED ORDER — EUCERIN EX CREA: TOPICAL_CREAM | CUTANEOUS | 0 refills | Status: DC | PRN

## 2020-08-28 MED ORDER — METOPROLOL SUCCINATE ER 50 MG PO TB24
50.0000 mg | ORAL_TABLET | Freq: Every day | ORAL | Status: DC
  Administered 2020-08-28: 50 mg via ORAL
  Filled 2020-08-28: qty 1

## 2020-08-28 MED ORDER — LISINOPRIL 10 MG PO TABS
10.0000 mg | ORAL_TABLET | Freq: Once | ORAL | Status: AC
  Administered 2020-08-28: 10 mg via ORAL
  Filled 2020-08-28: qty 1

## 2020-08-28 MED ORDER — MEDICAL COMPRESSION STOCKINGS MISC: 1.0000 | Freq: Every day | 6 refills | Status: DC

## 2020-08-28 NOTE — ED Notes (Signed)
Was going to discharge pt but pt does not have ride home. Pt thought that provider was using pt's insurance card to pay for ride home but pt was mistaken. Pt now attempting to call friends for ride.

## 2020-08-28 NOTE — ED Notes (Addendum)
Pt states that had high BP which caused varicose vein on R dorsal foot to burst. BP was 180/100. Pt also felt vertigo blurry vision at that time.  Pt takes BP meds and anticoagulants. Pt has a fib, goes in and out of a fib. Current BP is 158/104. No bleeding noted from R foot at this time.

## 2020-08-28 NOTE — ED Triage Notes (Signed)
Pt also c/o sore throat.

## 2020-08-28 NOTE — ED Triage Notes (Signed)
Pt presents via EMS c/o chapped lip and varicose vein bleeding in the right foot. Bleeding controlled at this time.

## 2020-08-28 NOTE — Discharge Instructions (Signed)
Your imaging was negative for DVT. no acute findings on labs.  Follow discharge care instructions.  Apply ointment as directed.  Advised to start wearing support stockings.  Follow-up with home care.

## 2020-08-28 NOTE — ED Provider Notes (Signed)
[facility] Emergency Department Provider Note   ____________________________________________   Event Date/Time   First MD Initiated Contact with Patient 08/28/20 [phone]     (approximate)  I have reviewed the triage vital signs and the nursing notes.   HISTORY  Chief Complaint Extremity Laceration    HPI Evan [name] is a 65 y.o. male presents with bleeding to the dorsal aspect of right foot secondary to a ruptured varicose vein.  Patient stated bleeding was finally controlled with direct pressure.  Patient takes Xarelto.  Patient also states color changes in the bilateral lower extremities in the past 2 weeks.  Patient also states skin is flaking.  Patient rates his pain as a 6/10.  Patient described pain as "achy".  No other palliative measure for complaint prior to arrival.  Patient blood pressure elevated but he states not taking his 6:00 medicine due to arrival to the ED. denies dyspnea or chest pain.     Past Medical History:  Diagnosis Date  . Anginal pain ([place])   . Anxiety disorder   . Asthma   . Atresia of esophagus without fistula   . CAD (coronary artery disease)   . Cellulitis   . CHF (congestive heart failure) ([place])    NYHA CLASS III,CHRONIC,DIASTOLIC  . COPD (chronic obstructive pulmonary disease) ([place])   . Diabetes mellitus without complication ([place])   . Edema    RIGHT LOWER LEG  . Gastroesophageal reflux   . H/O: GI bleed   . History of pneumonia    Remote  . History of scarlet fever    Childhood  . Hyperlipidemia   . Hypertension   . Myocardial infarction ([place]) 2009  . Obesity   . Obstructive sleep apnea   . Pain    CHRONIC BACK / ABDOMINAL  . Panic disorder   . Peripheral venous insufficiency   . PTSD (post-traumatic stress disorder)   . Retinopathy    DIABETIC  . Stasis, venous   . Vertigo     Patient Active Problem List   Diagnosis Date Noted  . Atherosclerosis of aorta ([place]) 12/04/2019  . Persistent atrial  fibrillation ([place]) 09/12/2019  . CAD (coronary artery disease) 09/12/2019  . Chronic venous stasis 09/12/2019  . Hoarding disorder 09/12/2019  . Cervical spinal stenosis 09/12/2019  . Diabetic retinopathy ([place]) 09/12/2019  . COPD, mild ([place]) 09/12/2019  . Osteoporosis 09/09/2019  . History of prostate cancer 09/09/2019  . Anxiety 08/10/2019  . Obstructive sleep apnea on CPAP 08/10/2019  . Hyperlipidemia associated with type 2 diabetes mellitus ([place]) 08/10/2019  . Chronic diastolic CHF (congestive heart failure) ([place]) 01/09/2014  . Esophageal dysmotility 09/12/2013  . Type 2 diabetes mellitus with proteinuria ([place]) 02/14/2012  . Obesity, morbid (more than 100 lbs over ideal weight or BMI > 40) ([place]) 07/26/2011  . OLD MYOCARDIAL INFARCTION 03/02/2010  . Hypertension associated with diabetes ([place]) 03/20/2009  . CORONARY ATHEROSCLEROSIS, ARTERY BYPASS GRAFT 03/20/2009    Past Surgical History:  Procedure Laterality Date  . CARDIAC CATHETERIZATION    . CATARACT EXTRACTION Left   . CATARACT EXTRACTION W/PHACO Right 05/03/2017   Procedure: CATARACT EXTRACTION PHACO AND INTRAOCULAR LENS PLACEMENT (IOC);  Surgeon: Leandrew Koyanagi, MD;  Location: [facility] ORS;  Service: Ophthalmology;  Laterality: Right;  [place] 00:35.3 AP% 12.3 CDE 4.33 Fluid Pack lot # [id number]       . CORONARY ANGIOPLASTY WITH STENT PLACEMENT  2002  . CORONARY ANGIOPLASTY WITH STENT PLACEMENT  1999  . CORONARY ARTERY  BYPASS GRAFT     x7  . ESOPHAGOGASTRODUODENOSCOPY N/A 09/19/2016   Procedure: ESOPHAGOGASTRODUODENOSCOPY (EGD);  Surgeon: Lollie Sails, MD;  Location: [facility] ENDOSCOPY;  Service: Endoscopy;  Laterality: N/A;    Prior to Admission medications   Medication Sig Start Date End Date Taking? Authorizing Provider  Elastic Bandages & Supports (MEDICAL COMPRESSION STOCKINGS) MISC 1 Package by Does not apply route daily. 08/28/20  Yes Sable Feil, PA-C  Skin Protectants, Misc. (EUCERIN) cream Apply  topically as needed for dry skin. 08/28/20  Yes Sable Feil, PA-C  acetaminophen (TYLENOL) 500 MG tablet Take 500 mg by mouth every 6 (six) hours as needed.    [provider]  aspirin 81 MG chewable tablet Chew 81 mg by mouth daily.    [provider]  Blood Glucose Monitoring Suppl (ONETOUCH VERIO) w/Device KIT Use to check blood sugar 3 to 4 times a day and document.  Please bring to visits for review. 07/08/20   [name], Henrine Screws T, NP  cholecalciferol (VITAMIN D3) 25 MCG (1000 UNIT) tablet Take 1 tablet (1,000 Units total) by mouth daily. 07/08/20   [name], Henrine Screws T, NP  Emollient (EUCERIN) lotion Apply topically as needed for dry skin. Apply to legs and arms daily to help with dry skin. 04/30/20   [name], Henrine Screws T, NP  empagliflozin (JARDIANCE) 10 MG TABS tablet Take 1 tablet (10 mg total) by mouth daily. 07/08/20   [name], Henrine Screws T, NP  fluticasone (FLONASE) 50 MCG/ACT nasal spray Place 2 sprays into both nostrils daily.  12/31/13   [provider]  furosemide (LASIX) 40 MG tablet TAKE 2 TABLETS BY MOUTH ONCE EVERY MORNING AND 1 TABLET ONCE EVERY EVENING 05/18/20   [name], [name] T, NP  glucose blood (ONETOUCH VERIO) test strip Use to check blood sugar 3 to 4 times a day. 07/08/20   [name], Henrine Screws T, NP  hydrOXYzine (ATARAX/VISTARIL) 25 MG tablet Take 1 tablet (25 mg total) by mouth 3 (three) times daily as needed (dizziness or anxiety). 08/01/20   Carrie Mew, MD  Lancets [facility] ULTRASOFT) lancets Use to check blood sugar 3 to 4 times a day. 07/08/20   [name], Henrine Screws T, NP  lisinopril (ZESTRIL) 10 MG tablet Take 1 tablet (10 mg total) by mouth daily. 07/08/20   [name], Henrine Screws T, NP  metoprolol succinate (TOPROL XL) 50 MG 24 hr tablet Take 1 tablet (50 mg total) by mouth daily. 07/08/20   [name], Henrine Screws T, NP  nitroGLYCERIN (NITROSTAT) 0.4 MG SL tablet Place 1 tablet (0.4 mg total) under the tongue every 5 (five) minutes as needed for chest pain. 10/10/19    [name], Henrine Screws T, NP  Omega-3 Fatty Acids (OMEGA 3 500 PO) Take 500 mg by mouth daily.    [provider]  pantoprazole (PROTONIX) 40 MG tablet Take 1 tablet (40 mg total) by mouth daily. 04/08/20   [name], Henrine Screws T, NP  risperiDONE (RISPERDAL) 0.5 MG tablet Take 1 tablet (0.5 mg total) by mouth 2 (two) times daily. 07/08/20   [name], Henrine Screws T, NP  rivaroxaban (XARELTO) 20 MG TABS tablet Take 1 tablet (20 mg total) by mouth at bedtime. 07/08/20   [name], Henrine Screws T, NP  rosuvastatin (CRESTOR) 40 MG tablet Take 1 tablet (40 mg total) by mouth daily. 05/06/20   [name] T, NP    Allergies Penicillin g, Sulfa antibiotics, Tiotropium, and Zoloft [sertraline hcl]  Family History  Problem Relation Age of Onset  . Heart attack Mother   . Hypertension  Mother   . Hyperlipidemia Mother   . Heart attack Brother 4       MI  . Coronary artery disease Other     Social History Social History   Tobacco Use  . Smoking status: Never Smoker  . Smokeless tobacco: Never Used  Vaping Use  . Vaping Use: Never used  Substance Use Topics  . Alcohol use: No  . Drug use: No    Review of Systems Constitutional: No fever/chills.  Eyes: No visual changes. ENT: No sore throat. Cardiovascular: Denies chest pain.  Varicose vein. Respiratory: Denies shortness of breath. Gastrointestinal: No abdominal pain.  No nausea, no vomiting.  No diarrhea.  No constipation. Genitourinary: Negative for dysuria. Musculoskeletal: Negative for back pain. Skin: Negative for rash.  Dry skin. Neurological: Negative for headaches, focal weakness or numbness. Psychiatric:  Anxiety Endocrine:  Diabetes, hyperlipidemia, hypertension. Hematological/Lymphatic:  Allergic/Immunilogical: Penicillin, sulfa, Tiotropium, and Zoloft. ____________________________________________   PHYSICAL EXAM:  VITAL SIGNS: ED Triage Vitals  Enc Vitals Group     BP 08/28/20 0729 (!) 159/101     Pulse Rate 08/28/20 0729 (!)  101     Resp 08/28/20 0729 14     Temp 08/28/20 0729 99 F (37.2 C)     Temp Source 08/28/20 0729 Oral     SpO2 08/28/20 0729 97 %     Weight --      Height --      Head Circumference --      Peak Flow --      Pain Score 08/28/20 0730 6     Pain Loc --      Pain Edu? --      Excl. in [place]? --     Constitutional: Alert and oriented.  Anxious.  Poor hygiene.   Eyes: Conjunctivae are normal. PERRL. EOMI. Head: Atraumatic. Nose: No congestion/rhinnorhea. Mouth/Throat: Mucous membranes are moist.  Oropharynx non-erythematous. Neck: No stridor. Hematological/Lymphatic/Immunilogical: No cervical lymphadenopathy. Cardiovascular: Normal rate, regular rhythm. Grossly normal heart sounds.  Varicose veins of the lower extremities.   Respiratory: Normal respiratory effort.  No retractions. Lungs CTAB. Gastrointestinal: Soft and nontender. No distention. No abdominal bruits. No CVA tenderness. Musculoskeletal: No obvious deformity.  Mild bilateral pitting edema..  No joint effusions.  Full and equal range of motion. Neurologic:  Normal speech and language. No gross focal neurologic deficits are appreciated. No gait instability. Skin:  Skin is warm, dry and intact. No rash noted.  Extremely dry skin Psychiatric: Anxious ____________________________________________   LABS (all labs ordered are listed, but only abnormal results are displayed)  Labs Reviewed  COMPREHENSIVE METABOLIC PANEL - Abnormal; Notable for the following components:      Result Value   Glucose, Bld 146 (*)    All other components within normal limits  CBC WITH DIFFERENTIAL/PLATELET - Abnormal; Notable for the following components:   RDW 16.0 (*)    Neutro Abs 8.5 (*)    All other components within normal limits  PROTIME-INR   ____________________________________________  EKG   ____________________________________________  RADIOLOGY I, Sable Feil, personally viewed and evaluated these images (plain  radiographs) as part of my medical decision making, as well as reviewing the written report by the radiologist.  ED MD interpretation:    Official radiology report(s): US Venous Img Lower Unilateral Right  Result Date: 08/28/2020 CLINICAL DATA:  Right lower extremity pain and edema. EXAM: RIGHT LOWER EXTREMITY VENOUS DOPPLER ULTRASOUND TECHNIQUE: Gray-scale sonography with compression, as well as color and duplex ultrasound, were  performed to evaluate the deep venous system(s) from the level of the common femoral vein through the popliteal and proximal calf veins. COMPARISON:  None. FINDINGS: VENOUS Normal compressibility of the common femoral, superficial femoral, and popliteal veins, as well as the visualized calf veins. Visualized portions of profunda femoral vein and great saphenous vein unremarkable. No filling defects to suggest DVT on grayscale or color Doppler imaging. Doppler waveforms show normal direction of venous flow, normal respiratory plasticity and response to augmentation. Limited views of the contralateral common femoral vein are unremarkable. OTHER None. Limitations: none IMPRESSION: Negative. Electronically Signed   By: Lovey Newcomer M.D.   On: 08/28/2020 09:26    ____________________________________________   PROCEDURES  Procedure(s) performed (including Critical Care):  Procedures   ____________________________________________   INITIAL IMPRESSION / ASSESSMENT AND PLAN / ED COURSE  As part of my medical decision making, I reviewed the following data within the [place]         Patient presents for for bleeding varicose vein to the dorsal aspect the right foot.  Bleeding was controlled by time he arrived to the ED.  Discussed no acute findings of DVT on ultrasound the right lower extremity.  Discussed no acute findings on lab results.  Patient given discharge care instructions and is prescription for Eucerin for his dry skin.  Patient also advised  to purchase supports stockings to help control his varicose veins.  Advised to follow-up with PCP in the next 3 to 5 days.  Return to ED if condition worsens.      ____________________________________________   FINAL CLINICAL IMPRESSION(S) / ED DIAGNOSES  Final diagnoses:  PVD (peripheral vascular disease) ([place])  Bleeding from varicose vein  Xerosis of skin     ED Discharge Orders         Ordered    Elastic Bandages & Supports (MEDICAL COMPRESSION STOCKINGS) MISC  Daily        08/28/20 1151    Skin Protectants, Misc. (EUCERIN) cream  As needed        08/28/20 1158          *Please note:  Cem L Burson was evaluated in Emergency Department on 08/28/2020 for the symptoms described in the history of present illness. He was evaluated in the context of the global COVID-19 pandemic, which necessitated consideration that the patient might be at risk for infection with the SARS-CoV-2 virus that causes COVID-19. Institutional protocols and algorithms that pertain to the evaluation of patients at risk for COVID-19 are in a state of rapid change based on information released by regulatory bodies including the CDC and federal and state organizations. These policies and algorithms were followed during the patient's care in the ED.  Some ED evaluations and interventions may be delayed as a result of limited staffing during and the pandemic.*   Note:  This document was prepared using Dragon voice recognition software and may include unintentional dictation errors.    Sable Feil, PA-C 08/28/20 1212    Vanessa Grimsley, MD 08/29/20 [phone]

## 2020-08-30 NOTE — Telephone Encounter (Signed)
PCP and [facility] [employer] aware multiple messages sent same day/

## 2020-08-31 ENCOUNTER — Other Ambulatory Visit: Payer: Self-pay

## 2020-08-31 ENCOUNTER — Ambulatory Visit (INDEPENDENT_AMBULATORY_CARE_PROVIDER_SITE_OTHER): Payer: Medicare Other | Admitting: Gastroenterology

## 2020-08-31 ENCOUNTER — Encounter: Payer: Self-pay | Admitting: Gastroenterology

## 2020-08-31 ENCOUNTER — Telehealth: Payer: Self-pay | Admitting: Nurse Practitioner

## 2020-08-31 VITALS — BP 177/96 | HR 106 | Ht 65.0 in | Wt 232.6 lb

## 2020-08-31 DIAGNOSIS — Z1211 Encounter for screening for malignant neoplasm of colon: Secondary | ICD-10-CM | POA: Insufficient documentation

## 2020-08-31 DIAGNOSIS — R1310 Dysphagia, unspecified: Secondary | ICD-10-CM

## 2020-08-31 MED ORDER — PEG 3350-KCL-NA BICARB-NACL 420 G PO SOLR: 4000.0000 mL | Freq: Once | ORAL | 0 refills | Status: AC

## 2020-08-31 NOTE — Progress Notes (Signed)
Jonathon Bellows MD, MRCP(U.K) [address]  [place]  [place], [place] [address]  Main: [phone]  Fax: [phone]   Gastroenterology Consultation  Referring Provider:     [name], NP Primary Care Physician:  [name], NP Primary Gastroenterologist:  Dr. Jonathon Bellows  Reason for Consultation:   Esophageal dysmotility and colon cancer screening        HPI:   Evan [name] is a 65 y.o. y/o male referred for esophageal dysmotility and colon cancer screening.  Previously been seen by [facility] clinic back in 2017 for dysphagia and GERD.  Barium swallow back in 2017 showed no stricture or esophagitis.  Small reducible hiatal hernia noted.  08/28/2020: Hemoglobin 14.6 g, glucose 146 otherwise CMP normal. 09/19/2016: EGD: No clear endoscopic abnormality noted for dysphagia.  Empirical dilation was performed to 15 mm.  No biopsies of the esophagus were taken for eosinophilic esophagitis.  The patient is on Xarelto.  He states that he has been having issues with swallowing for the past 9 months mostly for solids.  On and off not consistent.  He takes Prilosec for acid reflux.  He states he is due for colon cancer screening and not had a colonoscopy over 10 years.  During his entire visit he was very concerned about his residential status where he is under threat of eviction  and is trying to prevent it with the help of attorney .     Past Medical History:  Diagnosis Date  . Anginal pain ([place])   . Anxiety disorder   . Asthma   . Atresia of esophagus without fistula   . CAD (coronary artery disease)   . Cellulitis   . CHF (congestive heart failure) ([place])    NYHA CLASS III,CHRONIC,DIASTOLIC  . COPD (chronic obstructive pulmonary disease) ([place])   . Diabetes mellitus without complication ([place])   . Edema    RIGHT LOWER LEG  . Gastroesophageal reflux   . H/O: GI bleed   . History of pneumonia    Remote  . History of scarlet fever    Childhood  . Hyperlipidemia    . Hypertension   . Myocardial infarction ([place]) 2009  . Obesity   . Obstructive sleep apnea   . Pain    CHRONIC BACK / ABDOMINAL  . Panic disorder   . Peripheral venous insufficiency   . PTSD (post-traumatic stress disorder)   . Retinopathy    DIABETIC  . Stasis, venous   . Vertigo     Past Surgical History:  Procedure Laterality Date  . CARDIAC CATHETERIZATION    . CATARACT EXTRACTION Left   . CATARACT EXTRACTION W/PHACO Right 05/03/2017   Procedure: CATARACT EXTRACTION PHACO AND INTRAOCULAR LENS PLACEMENT (IOC);  Surgeon: Leandrew Koyanagi, MD;  Location: [facility] ORS;  Service: Ophthalmology;  Laterality: Right;  [place] 00:35.3 AP% 12.3 CDE 4.33 Fluid Pack lot # [id number]       . CORONARY ANGIOPLASTY WITH STENT PLACEMENT  2002  . CORONARY ANGIOPLASTY WITH STENT PLACEMENT  1999  . CORONARY ARTERY BYPASS GRAFT     x7  . ESOPHAGOGASTRODUODENOSCOPY N/A 09/19/2016   Procedure: ESOPHAGOGASTRODUODENOSCOPY (EGD);  Surgeon: Lollie Sails, MD;  Location: [facility] ENDOSCOPY;  Service: Endoscopy;  Laterality: N/A;    Prior to Admission medications   Medication Sig Start Date End Date Taking? Authorizing Provider  acetaminophen (TYLENOL) 500 MG tablet Take 500 mg by mouth every 6 (six) hours as needed.    [provider]  aspirin  81 MG chewable tablet Chew 81 mg by mouth daily.    [provider]  Blood Glucose Monitoring Suppl (ONETOUCH VERIO) w/Device KIT Use to check blood sugar 3 to 4 times a day and document.  Please bring to visits for review. 07/08/20   [name], Henrine Screws T, NP  cholecalciferol (VITAMIN D3) 25 MCG (1000 UNIT) tablet Take 1 tablet (1,000 Units total) by mouth daily. 07/08/20   [name], NP  Elastic Bandages & Supports (MEDICAL COMPRESSION STOCKINGS) MISC 1 Package by Does not apply route daily. 08/28/20   Sable Feil, PA-C  Emollient (EUCERIN) lotion Apply topically as needed for dry skin. Apply to legs and arms daily to help with dry skin.  04/30/20   [name], Henrine Screws T, NP  empagliflozin (JARDIANCE) 10 MG TABS tablet Take 1 tablet (10 mg total) by mouth daily. 07/08/20   [name], Henrine Screws T, NP  fluticasone (FLONASE) 50 MCG/ACT nasal spray Place 2 sprays into both nostrils daily.  12/31/13   [provider]  furosemide (LASIX) 40 MG tablet TAKE 2 TABLETS BY MOUTH ONCE EVERY MORNING AND 1 TABLET ONCE EVERY EVENING 05/18/20   [name], [name] T, NP  glucose blood (ONETOUCH VERIO) test strip Use to check blood sugar 3 to 4 times a day. 07/08/20   [name], Henrine Screws T, NP  hydrOXYzine (ATARAX/VISTARIL) 25 MG tablet Take 1 tablet (25 mg total) by mouth 3 (three) times daily as needed (dizziness or anxiety). 08/01/20   Carrie Mew, MD  Lancets [facility] ULTRASOFT) lancets Use to check blood sugar 3 to 4 times a day. 07/08/20   [name], Henrine Screws T, NP  lisinopril (ZESTRIL) 10 MG tablet Take 1 tablet (10 mg total) by mouth daily. 07/08/20   [name], Henrine Screws T, NP  metoprolol succinate (TOPROL XL) 50 MG 24 hr tablet Take 1 tablet (50 mg total) by mouth daily. 07/08/20   [name], Henrine Screws T, NP  nitroGLYCERIN (NITROSTAT) 0.4 MG SL tablet Place 1 tablet (0.4 mg total) under the tongue every 5 (five) minutes as needed for chest pain. 10/10/19   [name], Henrine Screws T, NP  Omega-3 Fatty Acids (OMEGA 3 500 PO) Take 500 mg by mouth daily.    [provider]  pantoprazole (PROTONIX) 40 MG tablet Take 1 tablet (40 mg total) by mouth daily. 04/08/20   [name], Henrine Screws T, NP  risperiDONE (RISPERDAL) 0.5 MG tablet Take 1 tablet (0.5 mg total) by mouth 2 (two) times daily. 07/08/20   [name], Henrine Screws T, NP  rivaroxaban (XARELTO) 20 MG TABS tablet Take 1 tablet (20 mg total) by mouth at bedtime. 07/08/20   [name], Henrine Screws T, NP  rosuvastatin (CRESTOR) 40 MG tablet Take 1 tablet (40 mg total) by mouth daily. 05/06/20   [name], NP  Skin Protectants, Misc. (EUCERIN) cream Apply topically as needed for dry skin. 08/28/20   Sable Feil, PA-C    Family  History  Problem Relation Age of Onset  . Heart attack Mother   . Hypertension Mother   . Hyperlipidemia Mother   . Heart attack Brother 52       MI  . Coronary artery disease Other      Social History   Tobacco Use  . Smoking status: Never Smoker  . Smokeless tobacco: Never Used  Vaping Use  . Vaping Use: Never used  Substance Use Topics  . Alcohol use: No  . Drug use: No    Allergies as of 08/31/2020 - Review Complete 08/31/2020  Allergen Reaction Noted  .  Penicillin g Hives 01/09/2014  . Sulfa antibiotics Hives 01/09/2014  . Tiotropium  04/26/2017  . Zoloft [sertraline hcl] Other (See Comments) 09/18/2016    Review of Systems:    All systems reviewed and negative except where noted in HPI.   Physical Exam:  BP (!) 177/96 (BP Location: Right Arm, Patient Position: Sitting, Cuff Size: Large)   Pulse (!) 106   Ht 5' 5" (1.651 m)   Wt 232 lb 9.6 oz (105.5 kg)   BMI 38.71 kg/m  No LMP for male patient. Psych:  Alert and cooperative. Normal mood and affect. General:   Alert,  Well-developed, well-nourished, pleasant and cooperative in NAD Head:  Normocephalic and atraumatic. Eyes:  Sclera clear, no icterus.   Conjunctiva pink. Lungs: Central sternotomy scar respirations even and unlabored.  Clear throughout to auscultation.   No wheezes, crackles, or rhonchi. No acute distress. Heart:  Regular rate and rhythm; no murmurs, clicks, rubs, or gallops. Abdomen:  Normal bowel sounds.  No bruits.  Soft, non-tender and non-distended without masses, hepatosplenomegaly or hernias noted.  No guarding or rebound tenderness.    Neurologic:  Alert and oriented x3;  grossly normal neurologically. Psych:  Alert and cooperative. Normal mood and affect.  Imaging Studies: US Venous Img Lower Unilateral Right  Result Date: 08/28/2020 CLINICAL DATA:  Right lower extremity pain and edema. EXAM: RIGHT LOWER EXTREMITY VENOUS DOPPLER ULTRASOUND TECHNIQUE: Gray-scale sonography with  compression, as well as color and duplex ultrasound, were performed to evaluate the deep venous system(s) from the level of the common femoral vein through the popliteal and proximal calf veins. COMPARISON:  None. FINDINGS: VENOUS Normal compressibility of the common femoral, superficial femoral, and popliteal veins, as well as the visualized calf veins. Visualized portions of profunda femoral vein and great saphenous vein unremarkable. No filling defects to suggest DVT on grayscale or color Doppler imaging. Doppler waveforms show normal direction of venous flow, normal respiratory plasticity and response to augmentation. Limited views of the contralateral common femoral vein are unremarkable. OTHER None. Limitations: none IMPRESSION: Negative. Electronically Signed   By: Lovey Newcomer M.D.   On: 08/28/2020 09:26    Assessment and Plan:   DMARI SCHUBRING is a 66 y.o. y/o male has been referred for his official dysmotility and colon cancer screening.  History of intermittent dysphagia for the past 9 months.  Due for colon cancer screening.  Plan 1.  Continue to take PPI 2.  EGD to evaluate for dysphagia and rule out eosinophilic esophagitis/stricture and procedure colon cancer screening average risk.  On Xarelto will need holding instructions from his cardiologist  I have discussed alternative options, risks & benefits,  which include, but are not limited to, bleeding, infection, perforation,respiratory complication & drug reaction.  The patient agrees with this plan & written consent will be obtained.     Follow up in clinic after he has his procedures  Dr Jonathon Bellows MD,MRCP(U.K)

## 2020-08-31 NOTE — Telephone Encounter (Signed)
Patient called to notify the practice that their attorney Evan [name] may contact that practice seeking information related to his health. The patient is unsure of exactly when [name] will be making contact but patient wanted practice to be aware.  Evan [name] can be contacted at [phone] ext [phone]

## 2020-09-01 ENCOUNTER — Telehealth: Payer: Self-pay

## 2020-09-01 NOTE — Telephone Encounter (Signed)
Sent Blood thinner request to Cardiologist.

## 2020-09-09 ENCOUNTER — Telehealth: Payer: Self-pay | Admitting: Cardiovascular Disease

## 2020-09-09 ENCOUNTER — Telehealth: Payer: Self-pay | Admitting: Pharmacist

## 2020-09-09 NOTE — Telephone Encounter (Signed)
Patient with diagnosis of atrial fibrillation on Xarelt for anticoagulation.    Procedure: colonoscopy/EGD Date of procedure: 09/27/20   CHA2DS2-VASc Score = 5  This indicates a 7.2% annual risk of stroke. The patient's score is based upon: CHF History: Yes HTN History: Yes Diabetes History: Yes Stroke History: No Vascular Disease History: Yes Age Score: 1 Gender Score: 0  CrCl 136.7 (79.1w/IBW) Platelet count 261  Per office protocol, patient can hold Xarelto for 2 days prior to procedure.     Patient will not need bridging with Lovenox (enoxaparin) around procedure.

## 2020-09-09 NOTE — Telephone Encounter (Signed)
   [facility] Pre-operative Risk Assessment    HEARTCARE STAFF: - Please ensure there is not already an duplicate clearance open for this procedure. - Under Visit Info/Reason for Call, type in Other and utilize the format Clearance MM/DD/YY or Clearance TBD. Do not use dashes or single digits. - If request is for dental extraction, please clarify the # of teeth to be extracted.  Request for surgical clearance:  1. What type of surgery is being performed? Colonoscopy / EGD  2. When is this surgery scheduled? 09/27/20  3. What type of clearance is required (medical clearance vs. Pharmacy clearance to hold med vs. Both)? both  4. Are there any medications that need to be held prior to surgery and how long? Xarelto instructions  5. Practice name and name of physician performing surgery? [facility] - Dr Jonathon Bellows  6. What is the office phone number? [place]   7.   What is the office fax number? [phone]  8.   Anesthesia type (None, local, MAC, general) ? Not listed    Ace Gins 09/09/2020, 1:40 PM  _________________________________________________________________   (provider comments below)

## 2020-09-10 ENCOUNTER — Telehealth: Payer: Self-pay | Admitting: Nurse Practitioner

## 2020-09-10 NOTE — Telephone Encounter (Signed)
° °  Telephone encounter was:  Successful.  09/10/2020 Name: Evan [name] MRN: [id number] DOB: Mar 30, 1956  Evan [name] is a 65 y.o. year old male who is a primary care patient of [name], [name], NP . The community resource team was consulted for assistance with [place] guide performed the following interventions: Spoke with patient on 09/09/2020. Patient stated that currently he is still living in his apartment but he has been given an eviction notice by [employer]. Patient stated that he has been cleaning his home as requested by the [employer]. He also stated that he as already been to court and that his [place] from [place] (which Evan [name] states he as given her permission to speak on his behalf) went to court with him. Evan [name] stated that he has been actively searching for a new place. LCSW [name] has already given the patient resources for housing. Patient ask about transportaiton for his colonoscopy. Care Guide informed patient that he may be eligible for [employer] and if not he may be eligible for [employer]. Patient stated understanding..  Follow Up Plan:  Care guide will follow up with patient by phone over the next week.   [place], Care Management Phone: [phone] Email: [email or web address]

## 2020-09-10 NOTE — Telephone Encounter (Signed)
   Primary Cardiologist: Ida Rogue, MD  Chart reviewed as part of pre-operative protocol coverage.   Left a voicemail for patient to call back for ongoing preop assessment.   Abigail Butts, PA-C 09/10/2020, 2:03 PM

## 2020-09-12 NOTE — Telephone Encounter (Signed)
Would hold xarelto 2 days prior to procedure Stay on asa Restart xarelto when approved by GI Thx TG

## 2020-09-13 ENCOUNTER — Telehealth: Payer: Self-pay

## 2020-09-13 NOTE — Telephone Encounter (Signed)
Able to get in touch with pt about Dr. Donivan Scull advise to hold his Xarelto 20 mg tab 2 days prior to his upcoming GI procedure on 09/27/20, but to continue ASA, and may restart Xarelto once cleared by GI (Dr. Edwin Dada). Pt verbalized understanding, info was also uploaded to pt's MyChart account for reference. Pt has questions about his upcoming procedure, advise to reach out to his GI doctor, number for contact was given to pt for any questions or concerns.

## 2020-09-15 ENCOUNTER — Telehealth: Payer: Self-pay | Admitting: Nurse Practitioner

## 2020-09-15 NOTE — Telephone Encounter (Signed)
   Telephone encounter was:  Successful.  09/15/2020 Name: Evan [name] MRN: [id number] DOB: 08/06/1956  [name] Blackwell is a 65 y.o. year old male who is a primary care patient of [name], [name], NP . The community resource team was consulted for assistance with Transportation Needs  and Housing.  Care guide performed the following interventions: Spoke with patient today regarding referral. Paitent stated that he has found a lead on a place to stay, [employer]), but he is still going to court regarding his eviction with [employer]. Informed patient that he has the [employer] benefit and that he will need to give them a call at least 3 days before his appointment. Patient stated understanding. Patient had questions about his appointments in March. Informed patient that the automated system will sent out reminders for his appointments. .  Follow Up Plan:  No further follow up planned at this time. The patient has been provided with needed resources. Referral will be closed.   [place], Care Management Phone: [phone] Email: [email or web address]

## 2020-09-16 NOTE — Telephone Encounter (Signed)
   Primary Cardiologist: Ida Rogue, MD  Chart reviewed as part of pre-operative protocol coverage. Patient was contacted 09/16/2020 in reference to pre-operative risk assessment for pending surgery as outlined below.  Evan [name] was last seen on 03/2020 by Dr .[name] - h/o morbid obesity, diastolic CHF, CAD, CABG, PCIs, LEE, OSA, OCD, anxiety, DM, afib, GIB amongst other issues outlined in that note. The patient affirms he is doing very well from cardiac standpoint. However, he is very tangential and immediately began relaying his recent frustrations about being evicted from his living situation and consulting a lawyer to assist with this. This is documented in other notes. He states this is the reason his BP was elevated in the offices recently. He reports he has been following this at home with most recent readings in the 130s/70s. Therefore, based on ACC/AHA guidelines, the patient would be at acceptable risk for the planned procedure without further cardiovascular testing.   Per pharmD, "Per office protocol, patient can hold Xarelto for 2 days prior to procedure.   Patient will not need bridging with Lovenox (enoxaparin) around procedure." It appears in a separate phone note Dr. [name] did request to stay on ASA during procedure.  The patient was advised that if he develops new symptoms prior to surgery to contact our office to arrange for a follow-up visit, and he verbalized understanding.  I will route this recommendation to the requesting party via Epic fax function and remove from pre-op pool. Please call with questions.  Charlie Pitter, PA-C 09/16/2020, 10:04 AM

## 2020-09-20 NOTE — Progress Notes (Addendum)
Chronic Care Management Pharmacy Assistant   Name: Evan [name]  MRN: [id number] DOB: 1956-02-19  Reason for Encounter: Monthly Follow Up   Patient Questions:  1.  Have you seen any other providers since your last visit? YES 08-28-2020 ED visit related to PVD.  08-31-20 office visit with [name]   2.  Any changes in your medicines or health? No    PCP : [name], NP  Allergies:   Allergies  Allergen Reactions   Penicillin G Hives   Sulfa Antibiotics Hives   Tiotropium    Zoloft [Sertraline Hcl] Other (See Comments)    Medications: Outpatient Encounter Medications as of 09/09/2020  Medication Sig   acetaminophen (TYLENOL) 500 MG tablet Take 500 mg by mouth every 6 (six) hours as needed.   aspirin 81 MG chewable tablet Chew 81 mg by mouth daily.   Blood Glucose Monitoring Suppl (ONETOUCH VERIO) w/Device KIT Use to check blood sugar 3 to 4 times a day and document.  Please bring to visits for review.   cholecalciferol (VITAMIN D3) 25 MCG (1000 UNIT) tablet Take 1 tablet (1,000 Units total) by mouth daily.   Elastic Bandages & Supports (MEDICAL COMPRESSION STOCKINGS) MISC 1 Package by Does not apply route daily.   Emollient (EUCERIN) lotion Apply topically as needed for dry skin. Apply to legs and arms daily to help with dry skin. (Patient not taking: Reported on 08/31/2020)   empagliflozin (JARDIANCE) 10 MG TABS tablet Take 1 tablet (10 mg total) by mouth daily.   fluticasone (FLONASE) 50 MCG/ACT nasal spray Place 2 sprays into both nostrils daily.  (Patient not taking: Reported on 08/31/2020)   furosemide (LASIX) 40 MG tablet TAKE 2 TABLETS BY MOUTH ONCE EVERY MORNING AND 1 TABLET ONCE EVERY EVENING   glucose blood (ONETOUCH VERIO) test strip Use to check blood sugar 3 to 4 times a day.   hydrOXYzine (ATARAX/VISTARIL) 25 MG tablet Take 1 tablet (25 mg total) by mouth 3 (three) times daily as needed (dizziness or anxiety). (Patient not taking: Reported  on 08/31/2020)   Lancets (ONETOUCH ULTRASOFT) lancets Use to check blood sugar 3 to 4 times a day.   lisinopril (ZESTRIL) 10 MG tablet Take 1 tablet (10 mg total) by mouth daily.   metoprolol succinate (TOPROL XL) 50 MG 24 hr tablet Take 1 tablet (50 mg total) by mouth daily.   nitroGLYCERIN (NITROSTAT) 0.4 MG SL tablet Place 1 tablet (0.4 mg total) under the tongue every 5 (five) minutes as needed for chest pain.   Omega-3 Fatty Acids (OMEGA 3 500 PO) Take 500 mg by mouth daily. (Patient not taking: Reported on 08/31/2020)   pantoprazole (PROTONIX) 40 MG tablet Take 1 tablet (40 mg total) by mouth daily.   risperiDONE (RISPERDAL) 0.5 MG tablet Take 1 tablet (0.5 mg total) by mouth 2 (two) times daily.   rivaroxaban (XARELTO) 20 MG TABS tablet Take 1 tablet (20 mg total) by mouth at bedtime.   rosuvastatin (CRESTOR) 40 MG tablet Take 1 tablet (40 mg total) by mouth daily.   Skin Protectants, Misc. (EUCERIN) cream Apply topically as needed for dry skin.   No facility-administered encounter medications on file as of 09/09/2020.    Current Diagnosis: Patient Active Problem List   Diagnosis Date Noted   Colon cancer screening 08/31/2020   Dysphagia 08/31/2020   Atherosclerosis of aorta ([place]) 12/04/2019   Persistent atrial fibrillation ([place]) 09/12/2019   CAD (coronary artery disease) 09/12/2019   Chronic venous  stasis 09/12/2019   Hoarding disorder 09/12/2019   Cervical spinal stenosis 09/12/2019   Diabetic retinopathy ([place]) 09/12/2019   COPD, mild ([place]) 09/12/2019   Osteoporosis 09/09/2019   History of prostate cancer 09/09/2019   Anxiety 08/10/2019   Obstructive sleep apnea on CPAP 08/10/2019   Hyperlipidemia associated with type 2 diabetes mellitus ([place]) 08/10/2019   Chronic diastolic CHF (congestive heart failure) ([place]) 01/09/2014   Esophageal dysmotility 09/12/2013   Type 2 diabetes mellitus with proteinuria ([place]) 02/14/2012   Obesity, morbid (more than 100 lbs over ideal weight or  BMI > 40) ([place]) 07/26/2011   OLD MYOCARDIAL INFARCTION 03/02/2010   Hypertension associated with diabetes ([place]) 03/20/2009   CORONARY ATHEROSCLEROSIS, ARTERY BYPASS GRAFT 03/20/2009   09-08-2020: Called patient to follow up on his diabetic and hypertension management. The patient explained "this year has already started out crazy for me." The patient no showed with PharmD on 08-25-2020 when I asked the patient what happened he stated " I was dealing with housing stuff." The patient was scattered throughout the conversation. He was cooperative in answering my questions related to his HTN management, but would refer to his "housing" or "getting kicked out of my house" situation.multiple times. Continuous redirection was needed throughout the conversation.   We discussed his ER visit on 08-28-20. The patient explained that the outside of his right foot would not stop bleeding. He reported " my vein blew up." Per the ER report the patient reported he was able to control his bleeding with direct pressure. Also per the ER report the patient reported color changes in his bilateral lower extremities with flaking of the skin. Blood pressure on ER report was noted at 159/101 but per report the patient was not able to take his blood pressure medication. An ultrasound to the lower right leg was obtained with negative findings for a DVT. Patient was instructed to purchase compression hose. RX for Eucerin for dry skin. We reviewed and discussed the information above. The patient continuous to report he is checking his blood pressures daily, but during phone call was unable to give me a reading. He reported that he was using the Eucerin as directed and has seen improvements in his dry skin. The patient reported he wears his compression hose when he remembers.  Stressed the importance of taking his blood pressure medication as directed. Encouraged and stressed the need of taking his blood pressure at least twice daily and to  maintain a blood pressure log. Also encouraged daily use of his compression stockings, but reminded the patient he must take them off before bed and not to wear them for more than 8 hours. Also reminded the patient when he notices any changes to his body or how is feeling to reach out to his chronic care management team. The patient verbalized understanding of all information given.   The patient reported that he is scheduled for a colonoscopy on 09-27-2020. He explained that he had proper transportation to and from this procedure.   Patient was explaining how frustrated and concerned he was related to his "housing situation".  I attempted to reassure the patient that other colleagues were working on his situation.   09-10-2020: Reached out to the patient to reschedule his appointment with PharmD Birdena Crandall. Patient was out grocery shopping with his aide. PharmD appointment scheduled for 09-29-2020.    Cloretta Ned, LPN Clinical Pharmacist Assistant  [phone]   Follow-Up:  Scheduled Follow-Up With Clinical Pharmacist  I have reviewed the care  management and care coordination activities outlined in this encounter and I am certifying that I agree with the content of this note.  Junita Push. Kenton Kingfisher PharmD, [facility] [phone]

## 2020-09-23 ENCOUNTER — Other Ambulatory Visit
Admission: RE | Admit: 2020-09-23 | Discharge: 2020-09-23 | Disposition: A | Discharge status: Home / Self Care | Payer: Medicare Other | Source: Ambulatory Visit | Attending: Gastroenterology | Admitting: Gastroenterology

## 2020-09-23 ENCOUNTER — Other Ambulatory Visit: Payer: Self-pay

## 2020-09-23 DIAGNOSIS — U071 COVID-19: Secondary | ICD-10-CM | POA: Diagnosis not present

## 2020-09-23 DIAGNOSIS — Z01812 Encounter for preprocedural laboratory examination: Secondary | ICD-10-CM | POA: Diagnosis not present

## 2020-09-23 LAB — SARS CORONAVIRUS 2 (TAT 6-24 HRS): SARS Coronavirus 2: POSITIVE — AB

## 2020-09-24 ENCOUNTER — Ambulatory Visit: Payer: Self-pay | Admitting: *Deleted

## 2020-09-24 ENCOUNTER — Telehealth: Payer: Self-pay | Admitting: Gastroenterology

## 2020-09-24 ENCOUNTER — Telehealth: Payer: Self-pay | Admitting: Family

## 2020-09-24 ENCOUNTER — Other Ambulatory Visit: Payer: Self-pay | Admitting: Nurse Practitioner

## 2020-09-24 ENCOUNTER — Other Ambulatory Visit: Payer: Self-pay

## 2020-09-24 DIAGNOSIS — Z1211 Encounter for screening for malignant neoplasm of colon: Secondary | ICD-10-CM

## 2020-09-24 DIAGNOSIS — R1310 Dysphagia, unspecified: Secondary | ICD-10-CM

## 2020-09-24 MED ORDER — BUDESONIDE-FORMOTEROL FUMARATE 80-4.5 MCG/ACT IN AERO: 2.0000 | INHALATION_SPRAY | Freq: Two times a day (BID) | RESPIRATORY_TRACT | 4 refills | Status: DC

## 2020-09-24 NOTE — Telephone Encounter (Signed)
Patient had preoperative test that showed + COVID. Patient is concerned about his result- he does not have apparent symptoms. Patient does state his reflux is increased. Patient is requesting refill on inhaler.Patient is requesting- Rx for Symbicort. Patient reports normal reading: T 98.0, O2 sat 97.  Reason for Disposition . [1] [id number] diagnosed by positive lab test (e.g., PCR, rapid self-test kit) AND [2] mild symptoms (e.g., cough, fever, others) AND [2] no complications or SOB  Answer Assessment - Initial Assessment Questions 1. COVID-19 DIAGNOSIS: "Who made your COVID-19 diagnosis?" "Was it confirmed by a positive lab test or self-test?" If not diagnosed by a doctor (or NP/PA), ask "Are there lots of cases (community spread) where you live?" Note: See public health department website, if unsure.     Pre-op + COVID 2. COVID-19 EXPOSURE: "Was there any known exposure to COVID before the symptoms began?" CDC Definition of close contact: within 6 feet (2 meters) for a total of 15 minutes or more over a 24-hour period.      unsure 3. 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 WORST SYMPTOM: "What is your worst symptom?" (e.g., cough, fever, shortness of breath, muscle aches)     Sinus symptoms 5. COUGH: "Do you have a cough?" If Yes, ask: "How bad is the cough?"       No cough 6. FEVER: "Do you have a fever?" If Yes, ask: "What is your temperature, how was it measured, and when did it start?"     No fever 7. RESPIRATORY STATUS: "Describe your breathing?" (e.g., shortness of breath, wheezing, unable to speak)      No problems breathing- reflux worse 8. BETTER-SAME-WORSE: "Are you getting better, staying the same or getting worse compared to yesterday?"  If getting worse, ask, "In what way?"     same 9. HIGH RISK DISEASE: "Do you have any chronic medical problems?" (e.g., asthma, heart or lung disease, weak immune system, obesity, etc.)      COPD 10. VACCINE: "Have you had the COVID-19 vaccine?" If Yes, ask: "Which one, how many shots, when did you get it?"       yes 11. BOOSTER: "Have you received your COVID-19 booster?" If Yes, ask: "Which one and when did you get it?"       *No Answer* 12. PREGNANCY: "Is there any chance you are pregnant?" "When was your last menstrual period?"       n/a 13. OTHER SYMPTOMS: "Do you have any other symptoms?"  (e.g., chills, fatigue, headache, loss of smell or taste, muscle pain, sore throat)       One day diarrhea, reflux increase 14. O2 SATURATION MONITOR:  "Do you use an oxygen saturation monitor (pulse oximeter) at home?" If Yes, ask "What is your reading (oxygen level) today?" "What is your usual oxygen saturation reading?" (e.g., 95%)       O2 sat 97  Protocols used: CORONAVIRUS (COVID-19) DIAGNOSED OR SUSPECTED-A-AH

## 2020-09-24 NOTE — Telephone Encounter (Signed)
Called to discuss with patient about COVID-19 symptoms and the use of one of the available treatments for those with mild to moderate Covid symptoms and at a high risk of hospitalization.  Pt appears to qualify for outpatient treatment due to co-morbid conditions and/or a member of an at-risk group in accordance with the FDA Emergency Use Authorization.    Symptom onset: Vaccinated:  Booster?  Immunocompromised?  Qualifiers:   Evan [name] was seen for perioperative clearance on 2/17 and was noted to have a positive Covid test. I attempted to speak with Evan [name] via phone to obtain additional information and was unable to reach him. Voicemail with call back number was left and MyChart sent.   Terri Piedra, NP 09/24/2020 9:03 AM

## 2020-09-24 NOTE — Telephone Encounter (Signed)
-----   Message from Jonathon Bellows, MD sent at 09/24/2020  8:41 AM EST ----- Michelle/Yazhini Mcaulay/Jadijah  Patient positive fpr COVID- reschedule procedures set for Monday , patient to follow up with [name], NP for COVID.  C/c [name], NP    Dr Jonathon Bellows MD,MRCP [facility]) Gastroenterology/Hepatology Pager: [phone]

## 2020-09-24 NOTE — Telephone Encounter (Signed)
Called pt scheduled for Monday

## 2020-09-24 NOTE — Progress Notes (Signed)
Rescheduled procedure and instructions sent to my chart and mailed out.

## 2020-09-24 NOTE — Telephone Encounter (Signed)
Sent in refill -- I sent separate message to you all about this as Dr. Vicente Males alerted me.  Please schedule for virtual visit next week if he can do this.  Thank you.

## 2020-09-24 NOTE — Telephone Encounter (Signed)
Pt has been informed of the COVID results. Informed him to call back when he is ready to schedule. Pt verbalized understanding.

## 2020-09-24 NOTE — Telephone Encounter (Signed)
Hospital called to let us know that patient will need to reschedule his procedure on 09/27/20 because he is Covid +

## 2020-09-27 ENCOUNTER — Encounter: Payer: Self-pay | Admitting: Nurse Practitioner

## 2020-09-27 ENCOUNTER — Ambulatory Visit (INDEPENDENT_AMBULATORY_CARE_PROVIDER_SITE_OTHER): Payer: Medicare Other | Admitting: Nurse Practitioner

## 2020-09-27 ENCOUNTER — Ambulatory Visit: Admission: RE | Admit: 2020-09-27 | Payer: Medicare Other | Source: Home / Self Care | Admitting: Gastroenterology

## 2020-09-27 ENCOUNTER — Encounter: Admission: RE | Payer: Self-pay | Source: Home / Self Care

## 2020-09-27 DIAGNOSIS — U071 COVID-19: Secondary | ICD-10-CM

## 2020-09-27 DIAGNOSIS — Z8616 Personal history of COVID-19: Secondary | ICD-10-CM | POA: Insufficient documentation

## 2020-09-27 SURGERY — COLONOSCOPY WITH PROPOFOL
Anesthesia: General

## 2020-09-27 NOTE — Patient Instructions (Signed)
COVID-19: What to Do if You Are Sick If you have a fever, cough or other symptoms, you might have COVID-19. Most people have mild illness and are able to recover at home. If you are sick:  Keep track of your symptoms.  If you have an emergency warning sign (including trouble breathing), call 911. Steps to help prevent the spread of COVID-19 if you are sick If you are sick with COVID-19 or think you might have COVID-19, follow the steps below to care for yourself and to help protect other people in your home and community. Stay home except to get medical care  Stay home. Most people with COVID-19 have mild illness and can recover at home without medical care. Do not leave your home, except to get medical care. Do not visit public areas.  Take care of yourself. Get rest and stay hydrated. Take over-the-counter medicines, such as acetaminophen, to help you feel better.  Stay in touch with your doctor. Call before you get medical care. Be sure to get care if you have trouble breathing, or have any other emergency warning signs, or if you think it is an emergency.  Avoid public transportation, ride-sharing, or taxis. Separate yourself from other people As much as possible, stay in a specific room and away from other people and pets in your home. If possible, you should use a separate bathroom. If you need to be around other people or animals in or outside of the home, wear a mask. Tell your close contactsthat they may have been exposed to COVID-19. An infected person can spread COVID-19 starting 48 hours (or 2 days) before the person has any symptoms or tests positive. By letting your close contacts know they may have been exposed to COVID-19, you are helping to protect everyone.  Additional guidance is available for those living in close quarters and shared housing.  See COVID-19 and Animals if you have questions about pets.  If you are diagnosed with COVID-19, someone from the health department  may call you. Answer the call to slow the spread. Monitor your symptoms  Symptoms of COVID-19 include fever, cough, or other symptoms.  Follow care instructions from your healthcare provider and local health department. Your local health authorities may give instructions on checking your symptoms and reporting information. When to seek emergency medical attention Look for emergency warning signs* for COVID-19. If someone is showing any of these signs, seek emergency medical care immediately:  Trouble breathing  Persistent pain or pressure in the chest  New confusion  Inability to wake or stay awake  Pale, gray, or blue-colored skin, lips, or nail beds, depending on skin tone *This list is not all possible symptoms. Please call your medical provider for any other symptoms that are severe or concerning to you. Call 911 or call ahead to your local emergency facility: Notify the operator that you are seeking care for someone who has or may have COVID-19. Call ahead before visiting your doctor  Call ahead. Many medical visits for routine care are being postponed or done by phone or telemedicine.  If you have a medical appointment that cannot be postponed, call your doctor's office, and tell them you have or may have COVID-19. This will help the office protect themselves and other patients. Get  tested  If you have symptoms of COVID-19, get tested. While waiting for test results, you stay away from others, including staying apart from those living in your household.  You can visit your state, tribal,   local, and territorialhealth department's website to look for the latest local information on testing sites. If you are sick, wear a mask over your nose and mouth  You should wear a mask over your nose and mouth if you must be around other people or animals, including pets (even at home).  You don't need to wear the mask if you are alone. If you can't put on a mask (because of trouble  breathing, for example), cover your coughs and sneezes in some other way. Try to stay at least 6 feet away from other people. This will help protect the people around you.  Masks should not be placed on young children under age 2 years, anyone who has trouble breathing, or anyone who is not able to remove the mask without help. Note: During the COVID-19 pandemic, medical grade facemasks are reserved for healthcare workers and some first responders. Cover your coughs and sneezes  Cover your mouth and nose with a tissue when you cough or sneeze.  Throw away used tissues in a lined trash can.  Immediately wash your hands with soap and water for at least 20 seconds. If soap and water are not available, clean your hands with an alcohol-based hand sanitizer that contains at least 60% alcohol. Clean your hands often  Wash your hands often with soap and water for at least 20 seconds. This is especially important after blowing your nose, coughing, or sneezing; going to the bathroom; and before eating or preparing food.  Use hand sanitizer if soap and water are not available. Use an alcohol-based hand sanitizer with at least 60% alcohol, covering all surfaces of your hands and rubbing them together until they feel dry.  Soap and water are the best option, especially if hands are visibly dirty.  Avoid touching your eyes, nose, and mouth with unwashed hands.  Handwashing Tips Avoid sharing personal household items  Do not share dishes, drinking glasses, cups, eating utensils, towels, or bedding with other people in your home.  Wash these items thoroughly after using them with soap and water or put in the dishwasher. Clean all "high-touch" surfaces everyday  Clean and disinfect high-touch surfaces in your "sick room" and bathroom; wear disposable gloves. Let someone else clean and disinfect surfaces in common areas, but you should clean your bedroom and bathroom, if possible.  If a caregiver or  other person needs to clean and disinfect a sick person's bedroom or bathroom, they should do so on an as-needed basis. The caregiver/other person should wear a mask and disposable gloves prior to cleaning. They should wait as long as possible after the person who is sick has used the bathroom before coming in to clean and use the bathroom. ? High-touch surfaces include phones, remote controls, counters, tabletops, doorknobs, bathroom fixtures, toilets, keyboards, tablets, and bedside tables.  Clean and disinfect areas that may have blood, stool, or body fluids on them.  Use household cleaners and disinfectants. Clean the area or item with soap and water or another detergent if it is dirty. Then, use a household disinfectant. ? Be sure to follow the instructions on the label to ensure safe and effective use of the product. Many products recommend keeping the surface wet for several minutes to ensure germs are killed. Many also recommend precautions such as wearing gloves and making sure you have good ventilation during use of the product. ? Use a product from EPA's List N: Disinfectants for Coronavirus (COVID-19). ? Complete Disinfection Guidance When you can be   around others after being sick with COVID-19 Deciding when you can be around others is different for different situations. Find out when you can safely end home isolation. For any additional questions about your care, contact your healthcare provider or state or local health department. 10/22/2019 Content source: National Center for Immunization and Respiratory Diseases (NCIRD), Division of Viral Diseases This information is not intended to replace advice given to you by your health care provider. Make sure you discuss any questions you have with your health care provider. Document Revised: 06/07/2020 Document Reviewed: 06/07/2020 Elsevier Patient Education  2021 Elsevier Inc.  

## 2020-09-27 NOTE — Assessment & Plan Note (Signed)
Acute and improving with symptoms over one week, out of range for MAB or oral treatment.  Recommend he monitor BP and O2 sat daily and educated on reasons to call provider.  At this time recommend to self quarantine x 10 days and continue at home regimen.  Contact provider for any worsening or ongoing symptoms. Return to office as scheduled.

## 2020-09-27 NOTE — Progress Notes (Signed)
There were no vitals taken for this visit.   Subjective:    Patient ID: Evan [name], male    DOB: 01-02-1956, 65 y.o.   MRN: [id number]  HPI: Evan [name] is a 65 y.o. male  Chief Complaint  Patient presents with  . Covid Positive    Patient states he took an at-home covid test, pt states he started having symptoms about a week ago. Pt states he had chills. Pt denies having any other symptoms.     . This visit was completed via telephone due to the restrictions of the COVID-19 pandemic. All issues as above were discussed and addressed but no physical exam was performed. If it was felt that the patient should be evaluated in the office, they were directed there. The patient verbally consented to this visit. Patient was unable to complete an audio/visual visit due to Technical difficulties, Lack of internet. Due to the catastrophic nature of the COVID-19 pandemic, this visit was done through audio contact only. . Location of the patient: home . Location of the provider: work . Those involved with this call:  . Provider: [name], DNP . CMA: Irena Reichmann, CMA . Front Desk/Registration: Jill Side  . Time spent on call: 21 minutes on the phone discussing health concerns. 15 minutes total spent in review of patient's record and preparation of their chart.  . I verified patient identity using two factors (patient name and date of birth). Patient consents verbally to being seen via telemedicine visit today.   COVID POSITIVE Tested positive on 09/23/20 prior to colonoscopy, screening test. States he did have symptoms about one week ago, but at this time is feeling better.  Health department called and told him instructions he reports. Fever: no Cough: no Shortness of breath: no Wheezing: no Chest pain: no Chest tightness: no Chest congestion: no Nasal congestion: no Runny nose: no Post nasal drip: no Sneezing: no Sore throat: no Swollen glands: no Sinus  pressure: no Headache: no Face pain: no Toothache: no Ear pain: none Ear pressure: none Eyes red/itching:no Eye drainage/crusting: no  Vomiting: no Rash: no Fatigue: no Sick contacts: no Strep contacts: no  Context: better Recurrent sinusitis: no Relief with OTC cold/cough medications: no  Treatments attempted: mucinex and cough syrup   Relevant past medical, surgical, family and social history reviewed and updated as indicated. Interim medical history since our last visit reviewed. Allergies and medications reviewed and updated.  Review of Systems  Constitutional: Negative.   Respiratory: Negative.   Cardiovascular: Negative.   Gastrointestinal: Negative.   Neurological: Negative.   Psychiatric/Behavioral: Negative.     Per HPI unless specifically indicated above     Objective:    There were no vitals taken for this visit.  Wt Readings from Last 3 Encounters:  08/31/20 232 lb 9.6 oz (105.5 kg)  08/01/20 239 lb (108.4 kg)  07/08/20 235 lb 9.6 oz (106.9 kg)    Physical Exam   Unable to perform due to telephone visit only.  Results for orders placed or performed during the hospital encounter of 09/23/20  SARS CORONAVIRUS 2 (TAT 6-24 HRS) Nasopharyngeal Nasopharyngeal Swab   Specimen: Nasopharyngeal Swab  Result Value Ref Range   SARS Coronavirus 2 POSITIVE (A) NEGATIVE      Assessment & Plan:   Problem List Items Addressed This Visit      Other   Lab test positive for detection of COVID-19 virus    Acute and improving with symptoms over one  week, out of range for MAB or oral treatment.  Recommend he monitor BP and O2 sat daily and educated on reasons to call provider.  At this time recommend to self quarantine x 10 days and continue at home regimen.  Contact provider for any worsening or ongoing symptoms. Return to office as scheduled.         I discussed the assessment and treatment plan with the patient. The patient was provided an opportunity to ask  questions and all were answered. The patient agreed with the plan and demonstrated an understanding of the instructions.   The patient was advised to call back or seek an in-person evaluation if the symptoms worsen or if the condition fails to improve as anticipated.   I provided 21+ minutes of time during this encounter.  Follow up plan: Return for as scheduled.

## 2020-09-29 ENCOUNTER — Ambulatory Visit (INDEPENDENT_AMBULATORY_CARE_PROVIDER_SITE_OTHER): Payer: Medicare Other | Admitting: Pharmacist

## 2020-09-29 ENCOUNTER — Other Ambulatory Visit: Payer: Self-pay | Admitting: Nurse Practitioner

## 2020-09-29 DIAGNOSIS — E1159 Type 2 diabetes mellitus with other circulatory complications: Secondary | ICD-10-CM | POA: Diagnosis not present

## 2020-09-29 DIAGNOSIS — I152 Hypertension secondary to endocrine disorders: Secondary | ICD-10-CM

## 2020-09-29 DIAGNOSIS — E1129 Type 2 diabetes mellitus with other diabetic kidney complication: Secondary | ICD-10-CM | POA: Diagnosis not present

## 2020-09-29 DIAGNOSIS — R809 Proteinuria, unspecified: Secondary | ICD-10-CM

## 2020-09-29 DIAGNOSIS — I4819 Other persistent atrial fibrillation: Secondary | ICD-10-CM

## 2020-09-29 MED ORDER — ONETOUCH DELICA LANCETS 33G MISC: 5 refills | Status: DC

## 2020-09-29 MED ORDER — SHARPS CONTAINER MISC: 1.0000 | 1 refills | Status: DC | PRN
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
## 2020-10-06 ENCOUNTER — Encounter: Payer: Self-pay | Admitting: Nurse Practitioner

## 2020-10-06 ENCOUNTER — Other Ambulatory Visit: Payer: Self-pay

## 2020-10-06 ENCOUNTER — Ambulatory Visit (INDEPENDENT_AMBULATORY_CARE_PROVIDER_SITE_OTHER): Payer: Medicare Other | Admitting: Nurse Practitioner

## 2020-10-06 VITALS — BP 136/78 | HR 88 | Temp 98.2°F | Ht 63.0 in | Wt 238.0 lb

## 2020-10-06 DIAGNOSIS — E785 Hyperlipidemia, unspecified: Secondary | ICD-10-CM

## 2020-10-06 DIAGNOSIS — I4819 Other persistent atrial fibrillation: Secondary | ICD-10-CM | POA: Diagnosis not present

## 2020-10-06 DIAGNOSIS — I700 Atherosclerosis of aorta: Secondary | ICD-10-CM

## 2020-10-06 DIAGNOSIS — Z8546 Personal history of malignant neoplasm of prostate: Secondary | ICD-10-CM

## 2020-10-06 DIAGNOSIS — Z0000 Encounter for general adult medical examination without abnormal findings: Secondary | ICD-10-CM | POA: Diagnosis not present

## 2020-10-06 DIAGNOSIS — Z9989 Dependence on other enabling machines and devices: Secondary | ICD-10-CM

## 2020-10-06 DIAGNOSIS — I5032 Chronic diastolic (congestive) heart failure: Secondary | ICD-10-CM | POA: Diagnosis not present

## 2020-10-06 DIAGNOSIS — G4733 Obstructive sleep apnea (adult) (pediatric): Secondary | ICD-10-CM

## 2020-10-06 DIAGNOSIS — Z1159 Encounter for screening for other viral diseases: Secondary | ICD-10-CM | POA: Diagnosis not present

## 2020-10-06 DIAGNOSIS — E1169 Type 2 diabetes mellitus with other specified complication: Secondary | ICD-10-CM | POA: Diagnosis not present

## 2020-10-06 DIAGNOSIS — E1159 Type 2 diabetes mellitus with other circulatory complications: Secondary | ICD-10-CM | POA: Diagnosis not present

## 2020-10-06 DIAGNOSIS — I152 Hypertension secondary to endocrine disorders: Secondary | ICD-10-CM | POA: Diagnosis not present

## 2020-10-06 DIAGNOSIS — F419 Anxiety disorder, unspecified: Secondary | ICD-10-CM

## 2020-10-06 DIAGNOSIS — J449 Chronic obstructive pulmonary disease, unspecified: Secondary | ICD-10-CM

## 2020-10-06 DIAGNOSIS — I25118 Atherosclerotic heart disease of native coronary artery with other forms of angina pectoris: Secondary | ICD-10-CM

## 2020-10-06 DIAGNOSIS — R809 Proteinuria, unspecified: Secondary | ICD-10-CM

## 2020-10-06 DIAGNOSIS — E1129 Type 2 diabetes mellitus with other diabetic kidney complication: Secondary | ICD-10-CM

## 2020-10-06 DIAGNOSIS — M810 Age-related osteoporosis without current pathological fracture: Secondary | ICD-10-CM | POA: Diagnosis not present

## 2020-10-06 DIAGNOSIS — I878 Other specified disorders of veins: Secondary | ICD-10-CM

## 2020-10-06 DIAGNOSIS — K224 Dyskinesia of esophagus: Secondary | ICD-10-CM

## 2020-10-06 LAB — BAYER DCA HB A1C WAIVED: HB A1C (BAYER DCA - WAIVED): 6.4 % (ref <7.0)

## 2020-10-06 LAB — MICROALBUMIN, URINE WAIVED
Creatinine, Urine Waived: 100 mg/dL (ref 10–300)
Microalb, Ur Waived: 30 mg/L — ABNORMAL HIGH (ref 0–19)

## 2020-10-06 MED ORDER — TRIAMCINOLONE ACETONIDE 0.1 % EX CREA: 1.0000 "application " | TOPICAL_CREAM | Freq: Two times a day (BID) | CUTANEOUS | 0 refills | Status: DC

## 2020-10-06 NOTE — Assessment & Plan Note (Signed)
Continue collaboration with oncology and urology.  PSA today.

## 2020-10-06 NOTE — Assessment & Plan Note (Signed)
Chronic, ongoing.  Recommend continue 100% use of CPAP.

## 2020-10-06 NOTE — Assessment & Plan Note (Addendum)
Chronic, stable.  Continue current inhaler regimen and adjust as needed, recommend he use inhaler daily vs as needed.  Plan on spirometry at next visit, unable to attain today.

## 2020-10-06 NOTE — Assessment & Plan Note (Signed)
Chronic, ongoing with no ulcerations noted.  Recommend he wear compression hose daily and monitor skin closely.  Referral to podiatry for foot care.

## 2020-10-06 NOTE — Assessment & Plan Note (Signed)
Noted on CT 10/29/17.  Continue Crestor and Xarelto which offer preventative.  Recommend he consistently take his medication daily, currently using pill packs.

## 2020-10-06 NOTE — Assessment & Plan Note (Signed)
Chronic, ongoing.  Continue current medication regimen and adjust as needed.  Return in 3 months for visit.  Lipid panel today.

## 2020-10-06 NOTE — Assessment & Plan Note (Signed)
Chronic, ongoing.  Continue collaboration with cardiology and current medication regimen, including Metoprolol and Xarelto.  CMP today.

## 2020-10-06 NOTE — Assessment & Plan Note (Signed)
Chronic, ongoing with A1C below goal at 6.4% and urine ALB 30 and A:C 30-300 today.  Continue Lisinopril for kidney protection.  Continue current Jardiance, benefit with his HF.  Check CMP today.  Recommend he check BS at least 3 mornings a week at home -- resent glucometer to [place].  Return to office in 3 months.  Continue pill packs for consistent medication adherence.

## 2020-10-06 NOTE — Assessment & Plan Note (Signed)
Chronic, ongoing. Continue collaboration with cardiology and HF clinic. Recommend he take medication consistently. Recommend: - Reminded to call for an overnight weight gain of >2 pounds or a weekly weight weight of >5 pounds - not adding salt to his food and has been reading food labels. Reviewed the importance of keeping daily sodium intake to 2000mg  daily  - Return in 2 months for visit and labs.

## 2020-10-06 NOTE — Progress Notes (Signed)
BP 136/78   Pulse 88   Temp 98.2 F (36.8 C) (Oral)   Ht 5' 3" (1.6 m)   Wt 238 lb (108 kg)   SpO2 96%   BMI 42.16 kg/m    Subjective:    Patient ID: Evan [name], male    DOB: [birth year], 65 y.o.   MRN: [id number]  HPI: Evan [name] is a 65 y.o. male presenting on 10/06/2020 for comprehensive medical examination. Current medical complaints include:none  He currently lives with: self Interim Problems from his last visit: no   DIABETES Continues on Jardiance 10 MG daily.  A1C last visit 6.3% Hypoglycemic episodes:no Polydipsia/polyuria: no Visual disturbance: no Chest pain: no Paresthesias: no Glucose Monitoring: no  Accucheck frequency: Not Checking  Fasting glucose:  Post prandial:  Evening:  Before meals: Taking Insulin?: no  Long acting insulin:  Short acting insulin: Blood Pressure Monitoring: not checking Retinal Examination: Up to Date Foot Exam: Up to Date Pneumovax: unknown Influenza: unknown Aspirin: yes   HYPERTENSION / HYPERLIPIDEMIA/HF & A-FIB Saw HF Clinic on 03/31/2020, no changes made.  Last saw Dr. [name] on 03/15/20.  Recent EF 08/28/2019 was 50 to 55%.  Uses CPAP at home about 100% of the time. Satisfied with current treatment? yes Duration of hypertension: chronic BP monitoring frequency: not checking BP range:  BP medication side effects: no Duration of hyperlipidemia: chronic Cholesterol medication side effects: no Cholesterol supplements: none Medication compliance: good compliance Aspirin: yes Recent stressors: no Recurrent headaches: no Visual changes: no Palpitations: no Dyspnea: no Chest pain: no Lower extremity edema: no Dizzy/lightheaded: no   COPD Has Symbicort -- does not use this all the time, only as needed he reports.  Never a smoker. COPD status: stable Satisfied with current treatment?: yes Oxygen use: no Dyspnea frequency: occasional at baseline Cough frequency: occasional at baseline Rescue inhaler  frequency:  rarely Limitation of activity: no Productive cough: none Last Spirometry: unknown Pneumovax: given today Influenza: Up to Date  GERD Has been followed by GI in past for dysphagia, last visit was 08/31/20.  Has colonoscopy scheduled for 10/22/20.  Continues on Protonix. GERD control status: uncontrolled  Satisfied with current treatment? no Heartburn frequency: 1-2 times a week Medication side effects: no  Medication compliance: stable Previous GERD medications: Zantac Antacid use frequency:  1-2 times a week Duration: occasional  Nature: burning Location: epigastric Heartburn duration: 1 hour Alleviatiating factors:  TUMS sometimes Aggravating factors: certain foods Dysphagia: no Odynophagia:  no Hematemesis: no Blood in stool: no EGD: yes in 2017  ANXIETY/STRESS Continues on Seroquel for mood, which he reports is helpful.  He discussed today his friendships with [name] and Virgel Gess, reporting they are friends and he talks with them often.  He reports having many famous friends and being related to [employer]. Duration:stable Anxious mood: no  Excessive worrying: no Irritability: no  Sweating: no Nausea: no Palpitations:no Hyperventilation: no Panic attacks: no Agoraphobia: no  Obscessions/compulsions: yes Depressed mood: no Depression screen [facility] 2/9 10/06/2020 07/08/2020 04/08/2020 02/16/2020 12/10/2019  Decreased Interest 0 0 0 0 0  Down, Depressed, Hopeless 0 0 0 0 0  PHQ - 2 Score 0 0 0 0 0  Altered sleeping 2 0 0 - 0  Tired, decreased energy 0 0 0 - 0  Change in appetite 0 0 0 - 0  Feeling bad or failure about yourself  0 0 0 - 0  Trouble concentrating 0 0 0 - 0  Moving slowly or  fidgety/restless 0 0 0 - 0  Suicidal thoughts 0 0 0 - 0  PHQ-9 Score 2 0 0 - 0  Difficult doing work/chores - Not difficult at all Not difficult at all - -  Some recent data might be hidden   Anhedonia: no Weight changes: no Insomnia: none Hypersomnia:  no Fatigue/loss of energy: no Feelings of worthlessness: no Feelings of guilt: no Impaired concentration/indecisiveness: no Suicidal ideations: no  Crying spells: no Recent Stressors/Life Changes: no   Relationship problems: no   Family stress: no     Financial stress: no    Job stress: no    Recent death/loss: no  OSTEOPOROSIS Noted on DEXA October 2020 with T -3.1.  No current medications, does take daily vitamin D and calcium.  Uses walker at baseline to prevent falls and assist mobility.  No recent falls.  Does not wish to start medication as is worried it will make heart burn worse. Satisfied with current treatment?: yes Medication compliance: good compliance Past osteoporosis medications/treatments: none Adequate calcium & vitamin D: yes Intolerance to bisphosphonates:has not taken Weight bearing exercises: yes   PROSTATE CANCER: Followed by urology & oncology with past radiation treatment.  Last visit with oncology in July 2020.  Last urology visit February 2019.  Functional Status Survey: Is the patient deaf or have difficulty hearing?: No Does the patient have difficulty seeing, even when wearing glasses/contacts?: No Does the patient have difficulty concentrating, remembering, or making decisions?: No Does the patient have difficulty walking or climbing stairs?: No Does the patient have difficulty dressing or bathing?: No Does the patient have difficulty doing errands alone such as visiting a doctor's office or shopping?: No  FALL RISK: Fall Risk  03/31/2020 02/16/2020 10/01/2019 08/20/2019 08/11/2019  Falls in the past year? 1 1 0 0 0  Comment - passed out - - -  Number falls in past yr: 0 0 0 0 0  Injury with Fall? 0 0 0 0 0  Risk for fall due to : History of fall(s) Medication side effect;History of fall(s);Impaired balance/gait - Impaired balance/gait -  Follow up Falls evaluation completed Falls evaluation completed;Education provided;Falls prevention discussed Falls  evaluation completed Falls evaluation completed -   Advanced Directives <no information>  Past Medical History:  Past Medical History:  Diagnosis Date  . Anginal pain ([place])   . Anxiety disorder   . Asthma   . Atresia of esophagus without fistula   . CAD (coronary artery disease)   . Cellulitis   . CHF (congestive heart failure) ([place])    NYHA CLASS III,CHRONIC,DIASTOLIC  . COPD (chronic obstructive pulmonary disease) ([place])   . Diabetes mellitus without complication ([place])   . Edema    RIGHT LOWER LEG  . Gastroesophageal reflux   . H/O: GI bleed   . History of pneumonia    Remote  . History of scarlet fever    Childhood  . Hyperlipidemia   . Hypertension   . Myocardial infarction ([place]) 2009  . Obesity   . Obstructive sleep apnea   . Pain    CHRONIC BACK / ABDOMINAL  . Panic disorder   . Peripheral venous insufficiency   . PTSD (post-traumatic stress disorder)   . Retinopathy    DIABETIC  . Stasis, venous   . Vertigo     Surgical History:  Past Surgical History:  Procedure Laterality Date  . CARDIAC CATHETERIZATION    . CATARACT EXTRACTION Left   . CATARACT EXTRACTION W/PHACO Right 05/03/2017  Procedure: CATARACT EXTRACTION PHACO AND INTRAOCULAR LENS PLACEMENT (IOC);  Surgeon: Leandrew Koyanagi, MD;  Location: [facility] ORS;  Service: Ophthalmology;  Laterality: Right;  [place] 00:35.3 AP% 12.3 CDE 4.33 Fluid Pack lot # [id number]       . CORONARY ANGIOPLASTY WITH STENT PLACEMENT  2002  . CORONARY ANGIOPLASTY WITH STENT PLACEMENT  1999  . CORONARY ARTERY BYPASS GRAFT     x7  . ESOPHAGOGASTRODUODENOSCOPY N/A 09/19/2016   Procedure: ESOPHAGOGASTRODUODENOSCOPY (EGD);  Surgeon: Lollie Sails, MD;  [facility] ENDOSCOPY;  Service: Endoscopy;  Laterality: N/A;    Medications:  Current Outpatient Medications on File Prior to Visit  Medication Sig  . acetaminophen (TYLENOL) 500 MG tablet Take 500 mg by mouth every 6 (six) hours as needed.  Marland Kitchen aspirin 81 MG  chewable tablet Chew 81 mg by mouth daily.  . Blood Glucose Monitoring Suppl (ONETOUCH VERIO) w/Device KIT Use to check blood sugar 3 to 4 times a day and document.  Please bring to visits for review.  . budesonide-formoterol (SYMBICORT) 80-4.5 MCG/ACT inhaler Inhale 2 puffs into the lungs 2 (two) times daily.  . cholecalciferol (VITAMIN D3) 25 MCG (1000 UNIT) tablet Take 1 tablet (1,000 Units total) by mouth daily.  Regino Schultze Bandages & Supports (MEDICAL COMPRESSION STOCKINGS) MISC 1 Package by Does not apply route daily.  . empagliflozin (JARDIANCE) 10 MG TABS tablet Take 1 tablet (10 mg total) by mouth daily.  . furosemide (LASIX) 40 MG tablet TAKE 2 TABLETS BY MOUTH ONCE EVERY MORNING AND 1 TABLET ONCE EVERY EVENING  . lisinopril (ZESTRIL) 10 MG tablet Take 1 tablet (10 mg total) by mouth daily.  . metoprolol succinate (TOPROL XL) 50 MG 24 hr tablet Take 1 tablet (50 mg total) by mouth daily.  . nitroGLYCERIN (NITROSTAT) 0.4 MG SL tablet Place 1 tablet (0.4 mg total) under the tongue every 5 (five) minutes as needed for chest pain.  . Omega-3 Fatty Acids (OMEGA 3 500 PO) Take 500 mg by mouth daily.  Glory Rosebush Delica Lancets 20F MISC Use to check blood sugar 2-3 times a day.  . pantoprazole (PROTONIX) 40 MG tablet Take 1 tablet (40 mg total) by mouth daily.  . risperiDONE (RISPERDAL) 0.5 MG tablet Take 1 tablet (0.5 mg total) by mouth 2 (two) times daily.  . rivaroxaban (XARELTO) 20 MG TABS tablet Take 1 tablet (20 mg total) by mouth at bedtime.  . rosuvastatin (CRESTOR) 40 MG tablet Take 1 tablet (40 mg total) by mouth daily.  . sharps container 1 each by Does not apply route as needed.  . Skin Protectants, Misc. (EUCERIN) cream Apply topically as needed for dry skin.  . Emollient (EUCERIN) lotion Apply topically as needed for dry skin. Apply to legs and arms daily to help with dry skin. (Patient not taking: No sig reported)  . fluticasone (FLONASE) 50 MCG/ACT nasal spray Place 2 sprays into  both nostrils daily.  (Patient not taking: No sig reported)  . glucose blood (ONETOUCH VERIO) test strip Use to check blood sugar 3 to 4 times a day.  . hydrOXYzine (ATARAX/VISTARIL) 25 MG tablet Take 1 tablet (25 mg total) by mouth 3 (three) times daily as needed (dizziness or anxiety). (Patient not taking: No sig reported)   No current facility-administered medications on file prior to visit.    Allergies:  Allergies  Allergen Reactions  . Penicillin G Hives  . Sulfa Antibiotics Hives  . Tiotropium   . Zoloft [Sertraline Hcl] Other (See Comments)  Social History:  Social History   Socioeconomic History  . Marital status: Single    Spouse name: Not on file  . Number of children: Not on file  . Years of education: Not on file  . Highest education level: Not on file  Occupational History    Comment: disabled  Tobacco Use  . Smoking status: Never Smoker  . Smokeless tobacco: Never Used  Vaping Use  . Vaping Use: Never used  Substance and Sexual Activity  . Alcohol use: No  . Drug use: No  . Sexual activity: Not Currently  Other Topics Concern  . Not on file  Social History Narrative   Disabled   Single   Social Determinants of Health   Financial Resource Strain: Low Risk   . Difficulty of Paying Living Expenses: Not hard at all  Food Insecurity: No Food Insecurity  . Worried About Charity fundraiser in the Last Year: Never true  . Ran Out of Food in the Last Year: Never true  Transportation Needs: No Transportation Needs  . Lack of Transportation (Medical): No  . Lack of Transportation (Non-Medical): No  Physical Activity: Inactive  . Days of Exercise per Week: 0 days  . Minutes of Exercise per Session: 0 min  Stress: No Stress Concern Present  . Feeling of Stress : Not at all  Social Connections: Moderately Integrated  . Frequency of Communication with Friends and Family: More than three times a week  . Frequency of Social Gatherings with Friends and  Family: More than three times a week  . Attends Religious Services: More than 4 times per year  . Active Member of Clubs or Organizations: Yes  . Attends Archivist Meetings: More than 4 times per year  . Marital Status: Never married  Intimate Partner Violence: Not At Risk  . Fear of Current or Ex-Partner: No  . Emotionally Abused: No  . Physically Abused: No  . Sexually Abused: No   Social History   Tobacco Use  Smoking Status Never Smoker  Smokeless Tobacco Never Used   Social History   Substance and Sexual Activity  Alcohol Use No    Family History:  Family History  Problem Relation Age of Onset  . Heart attack Mother   . Hypertension Mother   . Hyperlipidemia Mother   . Heart attack Brother 72       MI  . Coronary artery disease Other     Past medical history, surgical history, medications, allergies, family history and social history reviewed with patient today and changes made to appropriate areas of the chart.   Review of Systems - negative All other ROS negative except what is listed above and in the HPI.      Objective:    BP 136/78   Pulse 88   Temp 98.2 F (36.8 C) (Oral)   Ht 5' 3" (1.6 m)   Wt 238 lb (108 kg)   SpO2 96%   BMI 42.16 kg/m   Wt Readings from Last 3 Encounters:  10/06/20 238 lb (108 kg)  08/31/20 232 lb 9.6 oz (105.5 kg)  08/01/20 239 lb (108.4 kg)    Physical Exam Vitals and nursing note reviewed.  Constitutional:      General: He is awake. He is not in acute distress.    Appearance: He is well-developed and well-groomed. He is morbidly obese. He is not ill-appearing or toxic-appearing.  HENT:     Head: Normocephalic and atraumatic.  Right Ear: Hearing, tympanic membrane, ear canal and external ear normal. No drainage.     Left Ear: Hearing, tympanic membrane, ear canal and external ear normal. No drainage.     Nose: Nose normal.     Mouth/Throat:     Pharynx: Uvula midline.  Eyes:     General: Lids are  normal.        Right eye: No discharge.        Left eye: No discharge.     Extraocular Movements: Extraocular movements intact.     Conjunctiva/sclera: Conjunctivae normal.     Pupils: Pupils are equal, round, and reactive to light.     Visual Fields: Right eye visual fields normal and left eye visual fields normal.  Neck:     Thyroid: No thyromegaly.     Vascular: No carotid bruit.     Trachea: Trachea normal.  Cardiovascular:     Rate and Rhythm: Normal rate and regular rhythm.     Pulses:          Dorsalis pedis pulses are 1+ on the right side and 1+ on the left side.       Posterior tibial pulses are 1+ on the right side and 1+ on the left side.     Heart sounds: Normal heart sounds, S1 normal and S2 normal. No murmur heard. No gallop.      Comments: Varicosities bilateral lower legs with hemosiderin staining present and xerosis bilaterally. Pulmonary:     Effort: Pulmonary effort is normal. No accessory muscle usage or respiratory distress.     Breath sounds: Normal breath sounds.  Abdominal:     General: Bowel sounds are normal.     Palpations: Abdomen is soft. There is no hepatomegaly or splenomegaly.     Tenderness: There is no abdominal tenderness.  Musculoskeletal:        General: Normal range of motion.     Cervical back: Normal range of motion and neck supple.     Right lower leg: Edema (trace) present.     Left lower leg: Edema (trace) present.  Lymphadenopathy:     Head:     Right side of head: No submental, submandibular, tonsillar, preauricular or posterior auricular adenopathy.     Left side of head: No submental, submandibular, tonsillar, preauricular or posterior auricular adenopathy.     Cervical: No cervical adenopathy.  Skin:    General: Skin is warm and dry.     Capillary Refill: Capillary refill takes less than 2 seconds.     Findings: No rash.  Neurological:     Mental Status: He is alert and oriented to person, place, and time.     Cranial Nerves:  Cranial nerves are intact.     Coordination: Coordination is intact.     Gait: Gait is intact.     Deep Tendon Reflexes: Reflexes are normal and symmetric.     Reflex Scores:      Brachioradialis reflexes are 2+ on the right side and 2+ on the left side.      Patellar reflexes are 2+ on the right side and 2+ on the left side.    Comments: He is oriented to person, place, time, and provider.  Psychiatric:        Attention and Perception: Attention normal.        Mood and Affect: Mood normal.        Speech: Speech normal.        Behavior: Behavior normal.  Behavior is cooperative.     Comments: Very pleasant with baseline grandiose thought content present -- discussing his famous friends and relationships with them.      Diabetic Foot Exam - Simple   Simple Foot Form Visual Inspection See comments: Yes Sensation Testing Intact to touch and monofilament testing bilaterally: Yes Pulse Check See comments: Yes Comments 1+ DP and PT pulses bilaterally.  Xerosis and calluses bilateral feet with varicosities to lower extremities noted.  No ulcerations between toes or on feet.  Thickened toenails.    Results for orders placed or performed during the hospital encounter of 09/23/20  SARS CORONAVIRUS 2 (TAT 6-24 HRS) Nasopharyngeal Nasopharyngeal Swab   Specimen: Nasopharyngeal Swab  Result Value Ref Range   SARS Coronavirus 2 POSITIVE (A) NEGATIVE      Assessment & Plan:   Problem List Items Addressed This Visit      Cardiovascular and Mediastinum   Hypertension associated with diabetes ([place]) - Primary    Chronic, ongoing.  BP initially elevated, but repeat improved, baseline.  Recommend checking these at home a few mornings a week at home and documenting + focus on DASH diet.  Continue current medication regimen and adjust as needed + collaboration with cardiology. CMP, CBC, and TSH today. Return in 3 months.      Relevant Orders   Bayer DCA Hb A1c Waived   Microalbumin, Urine  Waived   Comprehensive metabolic panel   TSH   Chronic diastolic CHF (congestive heart failure) (HCC)    Chronic, ongoing. Continue collaboration with cardiology and HF clinic. Recommend he take medication consistently. Recommend: - Reminded to call for an overnight weight gain of >2 pounds or a weekly weight weight of >5 pounds - not adding salt to his food and has been reading food labels. Reviewed the importance of keeping daily sodium intake to <2053m daily  - Return in 2 months for visit and labs.      Persistent atrial fibrillation (HCC)    Chronic, ongoing.  Continue collaboration with cardiology and current medication regimen, including Metoprolol and Xarelto.  CMP today.      CAD (coronary artery disease)    Chronic, stable with no angina symptoms.  Continue current medication regimen and collaboration with cardiology.      Atherosclerosis of aorta ([place]    Noted on CT 10/29/17.  Continue Crestor and Xarelto which offer preventative.  Recommend he consistently take his medication daily, currently using pill packs.        Respiratory   Obstructive sleep apnea on CPAP    Chronic, ongoing.  Recommend continue 100% use of CPAP.      COPD, mild (HCC)    Chronic, stable.  Continue current inhaler regimen and adjust as needed, recommend he use inhaler daily vs as needed.  Plan on spirometry at next visit, unable to attain today.         Digestive   Esophageal dysmotility    Ongoing, reports poor control.  Will continue Protonix 40 MG daily, discussed with patient and educated him.  Check Mag level today.  Continue to collaborate with GI.      Relevant Orders   Magnesium     Endocrine   Type 2 diabetes mellitus with proteinuria (HCC)    Chronic, ongoing with A1C below goal at 6.4% and urine ALB 30 and A:C 30-300 today.  Continue Lisinopril for kidney protection.  Continue current Jardiance, benefit with his HF.  Check CMP today.  Recommend he check  BS at least 3 mornings  a week at home -- resent glucometer to [place].  Return to office in 3 months.  Continue pill packs for consistent medication adherence.      Relevant Orders   Bayer DCA Hb A1c Waived   Microalbumin, Urine Waived   Comprehensive metabolic panel   Ambulatory referral to Podiatry   Hyperlipidemia associated with type 2 diabetes mellitus (HCC)    Chronic, ongoing.  Continue current medication regimen and adjust as needed.  Return in 3 months for visit.  Lipid panel today.      Relevant Orders   Bayer DCA Hb A1c Waived   Lipid Panel w/o Chol/HDL Ratio     Musculoskeletal and Integument   Osteoporosis    Ongoing, continue daily calcium and Vit D, would be cautious with bisphosphonates due to his uncontrolled GERD.  May benefit from injectable vs oral treatment, discuss further at upcoming appointments. Vit D level today and plan for repeat DEXA this year.      Relevant Orders   VITAMIN D 25 Hydroxy (Vit-D Deficiency, Fractures)     Other   Obesity, morbid (more than 100 lbs over ideal weight or BMI > 40) (HCC)    BMI 42.16 with T2DM, HTN.  Recommended eating smaller high protein, low fat meals more frequently and exercising 30 mins a day 5 times a week with a goal of 10-15lb weight loss in the next 3 months. Patient voiced their understanding and motivation to adhere to these recommendations.       Anxiety    Chronic, ongoing with some grandiose thought processes present, ?more schizophrenia on presentation.  Continue current medication regimen and adjust as needed.  Has history on review old records of missing doses Risperdal, will continue current medication at this time and adjust as needed -- pill packs have been beneficial for patient.  Continue collaboration with CCM SW.  Would benefit from psychiatry and therapy, but he refuses these.  Will continue to encourage this and discuss with him.      History of prostate cancer    Continue collaboration with oncology and urology.  PSA  today.      Relevant Orders   PSA   Chronic venous stasis    Chronic, ongoing with no ulcerations noted.  Recommend he wear compression hose daily and monitor skin closely.  Referral to podiatry for foot care.       Other Visit Diagnoses    Need for hepatitis C screening test       Hep C screening on labs today.   Relevant Orders   Hepatitis C antibody   Annual physical exam       Annual labs today to include CBC, CMP, TSH, lipid, PSA.  Health maintenance reviewed, left before receiving PCV13, will provide next visit.       Discussed aspirin prophylaxis for myocardial infarction prevention and decision was on Xarelto and ASA, have discussed possible dicontinuation of ASA with patient.  LABORATORY TESTING:  Health maintenance labs ordered today as discussed above.   The natural history of prostate cancer and ongoing controversy regarding screening and potential treatment outcomes of prostate cancer has been discussed with the patient. The meaning of a false positive PSA and a false negative PSA has been discussed. He indicates understanding of the limitations of this screening test and wishes to proceed with screening PSA testing.   IMMUNIZATIONS:   - Tdap: Tetanus vaccination status reviewed: refused today. - Influenza: Refused - Pneumovax:  not applicable - Prevnar: Ordered today, but left before provided -- will give PCV13 next visit - Zostavax vaccine: Refused  SCREENING: - Colonoscopy: Refused  Discussed with patient purpose of the colonoscopy is to detect colon cancer at curable precancerous or early stages   - AAA Screening: Not applicable  -Hearing Test: Not applicable  -Spirometry: Not applicable   PATIENT COUNSELING:    Sexuality: Discussed sexually transmitted diseases, partner selection, use of condoms, avoidance of unintended pregnancy  and contraceptive alternatives.   Advised to avoid cigarette smoking.  I discussed with the patient that most people  either abstain from alcohol or drink within safe limits (<=14/week and <=4 drinks/occasion for males, <=7/weeks and <= 3 drinks/occasion for females) and that the risk for alcohol disorders and other health effects rises proportionally with the number of drinks per week and how often a drinker exceeds daily limits.  Discussed cessation/primary prevention of drug use and availability of treatment for abuse.   Diet: Encouraged to adjust caloric intake to maintain  or achieve ideal body weight, to reduce intake of dietary saturated fat and total fat, to limit sodium intake by avoiding high sodium foods and not adding table salt, and to maintain adequate dietary potassium and calcium preferably from fresh fruits, vegetables, and low-fat dairy products.    stressed the importance of regular exercise  Injury prevention: Discussed safety belts, safety helmets, smoke detector, smoking near bedding or upholstery.   Dental health: Discussed importance of regular tooth brushing, flossing, and dental visits.   Follow up plan: NEXT PREVENTATIVE PHYSICAL DUE IN 1 YEAR. Return in about 3 months (around 01/06/2021) for T2DM, HTN/HLD, COPD, HF, A-FIB, MOOD -- needs spirometry + PCV13 and tetanus vaccines.

## 2020-10-06 NOTE — Assessment & Plan Note (Signed)
BMI 42.16 with T2DM, HTN.  Recommended eating smaller high protein, low fat meals more frequently and exercising 30 mins a day 5 times a week with a goal of 10-15lb weight loss in the next 3 months. Patient voiced their understanding and motivation to adhere to these recommendations.

## 2020-10-06 NOTE — Assessment & Plan Note (Signed)
Ongoing, reports poor control.  Will continue Protonix 40 MG daily, discussed with patient and educated him.  Check Mag level today.  Continue to collaborate with GI.

## 2020-10-06 NOTE — Assessment & Plan Note (Signed)
Chronic, ongoing with some grandiose thought processes present, ?more schizophrenia on presentation.  Continue current medication regimen and adjust as needed.  Has history on review old records of missing doses Risperdal, will continue current medication at this time and adjust as needed -- pill packs have been beneficial for patient.  Continue collaboration with CCM SW.  Would benefit from psychiatry and therapy, but he refuses these.  Will continue to encourage this and discuss with him.

## 2020-10-06 NOTE — Patient Instructions (Signed)
Diabetes Mellitus and Nutrition, Adult When you have diabetes, or diabetes mellitus, it is very important to have healthy eating habits because your blood sugar (glucose) levels are greatly affected by what you eat and drink. Eating healthy foods in the right amounts, at about the same times every day, can help you:  Control your blood glucose.  Lower your risk of heart disease.  Improve your blood pressure.  Reach or maintain a healthy weight. What can affect my meal plan? Every person with diabetes is different, and each person has different needs for a meal plan. Your health care provider may recommend that you work with a dietitian to make a meal plan that is best for you. Your meal plan may vary depending on factors such as:  The calories you need.  The medicines you take.  Your weight.  Your blood glucose, blood pressure, and cholesterol levels.  Your activity level.  Other health conditions you have, such as heart or kidney disease. How do carbohydrates affect me? Carbohydrates, also called carbs, affect your blood glucose level more than any other type of food. Eating carbs naturally raises the amount of glucose in your blood. Carb counting is a method for keeping track of how many carbs you eat. Counting carbs is important to keep your blood glucose at a healthy level, especially if you use insulin or take certain oral diabetes medicines. It is important to know how many carbs you can safely have in each meal. This is different for every person. Your dietitian can help you calculate how many carbs you should have at each meal and for each snack. How does alcohol affect me? Alcohol can cause a sudden decrease in blood glucose (hypoglycemia), especially if you use insulin or take certain oral diabetes medicines. Hypoglycemia can be a life-threatening condition. Symptoms of hypoglycemia, such as sleepiness, dizziness, and confusion, are similar to symptoms of having too much  alcohol.  Do not drink alcohol if: ? Your health care provider tells you not to drink. ? You are pregnant, may be pregnant, or are planning to become pregnant.  If you drink alcohol: ? Do not drink on an empty stomach. ? Limit how much you use to:  0-1 drink a day for women.  0-2 drinks a day for men. ? Be aware of how much alcohol is in your drink. In the [place] one drink equals one 12 oz bottle of beer (355 mL), one 5 oz glass of wine (148 mL), or one 1 oz glass of hard liquor (44 mL). ? Keep yourself hydrated with water, diet soda, or unsweetened iced tea.  Keep in mind that regular soda, juice, and other mixers may contain a lot of sugar and must be counted as carbs. What are tips for following this plan? Reading food labels  Start by checking the serving size on the "Nutrition Facts" label of packaged foods and drinks. The amount of calories, carbs, fats, and other nutrients listed on the label is based on one serving of the item. Many items contain more than one serving per package.  Check the total grams (g) of carbs in one serving. You can calculate the number of servings of carbs in one serving by dividing the total carbs by 15. For example, if a food has 30 g of total carbs per serving, it would be equal to 2 servings of carbs.  Check the number of grams (g) of saturated fats and trans fats in one serving. Choose foods that have   a low amount or none of these fats.  Check the number of milligrams (mg) of salt (sodium) in one serving. Most people should limit total sodium intake to less than 2,300 mg per day.  Always check the nutrition information of foods labeled as "low-fat" or "nonfat." These foods may be higher in added sugar or refined carbs and should be avoided.  Talk to your dietitian to identify your daily goals for nutrients listed on the label. Shopping  Avoid buying canned, pre-made, or processed foods. These foods tend to be high in fat, sodium, and added  sugar.  Shop around the outside edge of the grocery store. This is where you will most often find fresh fruits and vegetables, bulk grains, fresh meats, and fresh dairy. Cooking  Use low-heat cooking methods, such as baking, instead of high-heat cooking methods like deep frying.  Cook using healthy oils, such as olive, canola, or sunflower oil.  Avoid cooking with butter, cream, or high-fat meats. Meal planning  Eat meals and snacks regularly, preferably at the same times every day. Avoid going long periods of time without eating.  Eat foods that are high in fiber, such as fresh fruits, vegetables, beans, and whole grains. Talk with your dietitian about how many servings of carbs you can eat at each meal.  Eat 4-6 oz (112-168 g) of lean protein each day, such as lean meat, chicken, fish, eggs, or tofu. One ounce (oz) of lean protein is equal to: ? 1 oz (28 g) of meat, chicken, or fish. ? 1 egg. ?  cup (62 g) of tofu.  Eat some foods each day that contain healthy fats, such as avocado, nuts, seeds, and fish.   What foods should I eat? Fruits Berries. Apples. Oranges. Peaches. Apricots. Plums. Grapes. Mango. Papaya. Pomegranate. Kiwi. Cherries. Vegetables Lettuce. Spinach. Leafy greens, including kale, chard, collard greens, and mustard greens. Beets. Cauliflower. Cabbage. Broccoli. Carrots. Green beans. Tomatoes. Peppers. Onions. Cucumbers. Brussels sprouts. Grains Whole grains, such as whole-wheat or whole-grain bread, crackers, tortillas, cereal, and pasta. Unsweetened oatmeal. Quinoa. Brown or wild rice. Meats and other proteins Seafood. Poultry without skin. Lean cuts of poultry and beef. Tofu. Nuts. Seeds. Dairy Low-fat or fat-free dairy products such as milk, yogurt, and cheese. The items listed above may not be a complete list of foods and beverages you can eat. Contact a dietitian for more information. What foods should I avoid? Fruits Fruits canned with  syrup. Vegetables Canned vegetables. Frozen vegetables with butter or cream sauce. Grains Refined white flour and flour products such as bread, pasta, snack foods, and cereals. Avoid all processed foods. Meats and other proteins Fatty cuts of meat. Poultry with skin. Breaded or fried meats. Processed meat. Avoid saturated fats. Dairy Full-fat yogurt, cheese, or milk. Beverages Sweetened drinks, such as soda or iced tea. The items listed above may not be a complete list of foods and beverages you should avoid. Contact a dietitian for more information. Questions to ask a health care provider  Do I need to meet with a diabetes educator?  Do I need to meet with a dietitian?  What number can I call if I have questions?  When are the best times to check my blood glucose? Where to find more information:  American Diabetes Association: diabetes.org  Academy of Nutrition and Dietetics: www.eatright.org  National Institute of Diabetes and Digestive and Kidney Diseases: www.niddk.nih.gov  Association of Diabetes Care and Education Specialists: www.diabeteseducator.org Summary  It is important to have healthy eating   habits because your blood sugar (glucose) levels are greatly affected by what you eat and drink.  A healthy meal plan will help you control your blood glucose and maintain a healthy lifestyle.  Your health care provider may recommend that you work with a dietitian to make a meal plan that is best for you.  Keep in mind that carbohydrates (carbs) and alcohol have immediate effects on your blood glucose levels. It is important to count carbs and to use alcohol carefully. This information is not intended to replace advice given to you by your health care provider. Make sure you discuss any questions you have with your health care provider. Document Revised: 07/01/2019 Document Reviewed: 07/01/2019 Elsevier Patient Education  2021 [employer]  

## 2020-10-06 NOTE — Assessment & Plan Note (Signed)
Chronic, stable with no angina symptoms.  Continue current medication regimen and collaboration with cardiology. °

## 2020-10-06 NOTE — Assessment & Plan Note (Signed)
Ongoing, continue daily calcium and Vit D, would be cautious with bisphosphonates due to his uncontrolled GERD.  May benefit from injectable vs oral treatment, discuss further at upcoming appointments. Vit D level today and plan for repeat DEXA this year. ?

## 2020-10-06 NOTE — Assessment & Plan Note (Signed)
Chronic, ongoing.  BP initially elevated, but repeat improved, baseline.  Recommend checking these at home a few mornings a week at home and documenting + focus on DASH diet.  Continue current medication regimen and adjust as needed + collaboration with cardiology. CMP, CBC, and TSH today. Return in 3 months.

## 2020-10-07 ENCOUNTER — Other Ambulatory Visit: Payer: Self-pay | Admitting: Nurse Practitioner

## 2020-10-07 LAB — HEPATITIS C ANTIBODY: Hep C Virus Ab: <0.1 s/co ratio (ref 0.0–0.9)

## 2020-10-07 MED ORDER — EZETIMIBE 10 MG PO TABS
10.0000 mg | ORAL_TABLET | Freq: Every day | ORAL | 3 refills | Status: DC
Start: 2020-10-07 — End: 2021-05-11

## 2020-10-07 NOTE — Progress Notes (Signed)
Contacted via MyChart   Good evening Evan [name], your labs have returned.  Kidney and liver function are normal.  Thyroid and prostate level are normal.  Hep C is negative.  Magnesium is normal.  Cholesterol levels remain elevated, please ensure you are taking your medications daily -- including cholesterol medication.  I am going to add a second cholesterol medication to regimen to help lower this.  Any questions? Keep being awesome!!  Thank you for allowing me to participate in your care. Kindest regards, Skyra Crichlow

## 2020-10-08 LAB — COMPREHENSIVE METABOLIC PANEL
ALT: 17 IU/L (ref 0–44)
AST: 19 IU/L (ref 0–40)
Albumin/Globulin Ratio: 1.6 (ref 1.2–2.2)
Albumin: 4.5 g/dL (ref 3.8–4.8)
Alkaline Phosphatase: 92 IU/L (ref 44–121)
BUN/Creatinine Ratio: 15 (ref 10–24)
BUN: 14 mg/dL (ref 8–27)
Bilirubin Total: 0.5 mg/dL (ref 0.0–1.2)
CO2: 16 mmol/L — ABNORMAL LOW (ref 20–29)
Calcium: 9.7 mg/dL (ref 8.6–10.2)
Chloride: 103 mmol/L (ref 96–106)
Creatinine, Ser: 0.91 mg/dL (ref 0.76–1.27)
Globulin, Total: 2.9 g/dL (ref 1.5–4.5)
Glucose: 137 mg/dL — ABNORMAL HIGH (ref 65–99)
Potassium: 4.6 mmol/L (ref 3.5–5.2)
Sodium: 143 mmol/L (ref 134–144)
Total Protein: 7.4 g/dL (ref 6.0–8.5)
eGFR: 93 mL/min/{1.73_m2} (ref >59)

## 2020-10-08 LAB — LIPID PANEL W/O CHOL/HDL RATIO
Cholesterol, Total: 256 mg/dL — ABNORMAL HIGH (ref 100–199)
HDL: 57 mg/dL (ref >39)
LDL Chol Calc (NIH): 174 mg/dL — ABNORMAL HIGH (ref 0–99)
Triglycerides: 139 mg/dL (ref 0–149)
VLDL Cholesterol Cal: 25 mg/dL (ref 5–40)

## 2020-10-08 LAB — MAGNESIUM: Magnesium: 2.1 mg/dL (ref 1.6–2.3)

## 2020-10-08 LAB — TSH: TSH: 2.08 u[IU]/mL (ref 0.450–4.500)

## 2020-10-08 LAB — HEPATITIS C ANTIBODY: Hep C Virus Ab: <0.1 s/co ratio (ref 0.0–0.9)

## 2020-10-08 LAB — PSA: Prostate Specific Ag, Serum: <0.1 ng/mL (ref 0.0–4.0)

## 2020-10-08 LAB — VITAMIN D 25 HYDROXY (VIT D DEFICIENCY, FRACTURES): Vit D, 25-Hydroxy: 23.8 ng/mL — ABNORMAL LOW (ref 30.0–100.0)

## 2020-10-15 ENCOUNTER — Telehealth: Payer: Self-pay | Admitting: General Practice

## 2020-10-15 ENCOUNTER — Ambulatory Visit (INDEPENDENT_AMBULATORY_CARE_PROVIDER_SITE_OTHER): Payer: Medicare Other | Admitting: General Practice

## 2020-10-15 DIAGNOSIS — E785 Hyperlipidemia, unspecified: Secondary | ICD-10-CM | POA: Diagnosis not present

## 2020-10-15 DIAGNOSIS — I152 Hypertension secondary to endocrine disorders: Secondary | ICD-10-CM | POA: Diagnosis not present

## 2020-10-15 DIAGNOSIS — E1169 Type 2 diabetes mellitus with other specified complication: Secondary | ICD-10-CM

## 2020-10-15 DIAGNOSIS — E1159 Type 2 diabetes mellitus with other circulatory complications: Secondary | ICD-10-CM | POA: Diagnosis not present

## 2020-10-15 DIAGNOSIS — I25118 Atherosclerotic heart disease of native coronary artery with other forms of angina pectoris: Secondary | ICD-10-CM | POA: Diagnosis not present

## 2020-10-15 DIAGNOSIS — F419 Anxiety disorder, unspecified: Secondary | ICD-10-CM
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
## 2020-10-15 NOTE — Chronic Care Management (AMB) (Signed)
Chronic Care Management   CCM RN Visit Note  10/15/2020 Name: RICCO DERSHEM MRN: [id number] DOB: 1956-02-14  Subjective: KEESHAWN FAKHOURI is a 65 y.o. year old male who is a primary care patient of [name], Henrine Screws T, NP. The care management team was consulted for assistance with disease management and care coordination needs.    Engaged with patient by telephone for follow up visit in response to provider referral for case management and/or care coordination services.   Consent to Services:  The patient was given information about Chronic Care Management services, agreed to services, and gave verbal consent prior to initiation of services.  Please see initial visit note for detailed documentation.   Patient agreed to services and verbal consent obtained.   Assessment: Review of patient past medical history, allergies, medications, health status, including review of consultants reports, laboratory and other test data, was performed as part of comprehensive evaluation and provision of chronic care management services.   SDOH (Social Determinants of Health) assessments and interventions performed:    CCM Care Plan  Allergies  Allergen Reactions  . Penicillin G Hives  . Sulfa Antibiotics Hives  . Tiotropium   . Zoloft [Sertraline Hcl] Other (See Comments)    Outpatient Encounter Medications as of 10/15/2020  Medication Sig  . acetaminophen (TYLENOL) 500 MG tablet Take 500 mg by mouth every 6 (six) hours as needed.  Marland Kitchen aspirin 81 MG chewable tablet Chew 81 mg by mouth daily.  . Blood Glucose Monitoring Suppl (ONETOUCH VERIO) w/Device KIT Use to check blood sugar 3 to 4 times a day and document.  Please bring to visits for review.  . budesonide-formoterol (SYMBICORT) 80-4.5 MCG/ACT inhaler Inhale 2 puffs into the lungs 2 (two) times daily.  . cholecalciferol (VITAMIN D3) 25 MCG (1000 UNIT) tablet Take 1 tablet (1,000 Units total) by mouth daily.  Regino Schultze Bandages & Supports (MEDICAL  COMPRESSION STOCKINGS) MISC 1 Package by Does not apply route daily.  . Emollient (EUCERIN) lotion Apply topically as needed for dry skin. Apply to legs and arms daily to help with dry skin. (Patient not taking: No sig reported)  . empagliflozin (JARDIANCE) 10 MG TABS tablet Take 1 tablet (10 mg total) by mouth daily.  Marland Kitchen ezetimibe (ZETIA) 10 MG tablet Take 1 tablet (10 mg total) by mouth daily.  . fluticasone (FLONASE) 50 MCG/ACT nasal spray Place 2 sprays into both nostrils daily.  (Patient not taking: No sig reported)  . furosemide (LASIX) 40 MG tablet TAKE 2 TABLETS BY MOUTH ONCE EVERY MORNING AND 1 TABLET ONCE EVERY EVENING  . glucose blood (ONETOUCH VERIO) test strip Use to check blood sugar 3 to 4 times a day.  . hydrOXYzine (ATARAX/VISTARIL) 25 MG tablet Take 1 tablet (25 mg total) by mouth 3 (three) times daily as needed (dizziness or anxiety). (Patient not taking: No sig reported)  . lisinopril (ZESTRIL) 10 MG tablet Take 1 tablet (10 mg total) by mouth daily.  . metoprolol succinate (TOPROL XL) 50 MG 24 hr tablet Take 1 tablet (50 mg total) by mouth daily.  . nitroGLYCERIN (NITROSTAT) 0.4 MG SL tablet Place 1 tablet (0.4 mg total) under the tongue every 5 (five) minutes as needed for chest pain.  . Omega-3 Fatty Acids (OMEGA 3 500 PO) Take 500 mg by mouth daily.  Glory Rosebush Delica Lancets 38S MISC Use to check blood sugar 2-3 times a day.  . pantoprazole (PROTONIX) 40 MG tablet Take 1 tablet (40 mg total) by mouth daily.  Marland Kitchen  risperiDONE (RISPERDAL) 0.5 MG tablet Take 1 tablet (0.5 mg total) by mouth 2 (two) times daily.  . rivaroxaban (XARELTO) 20 MG TABS tablet Take 1 tablet (20 mg total) by mouth at bedtime.  . rosuvastatin (CRESTOR) 40 MG tablet Take 1 tablet (40 mg total) by mouth daily.  . sharps container 1 each by Does not apply route as needed.  . Skin Protectants, Misc. (EUCERIN) cream Apply topically as needed for dry skin.  Marland Kitchen triamcinolone (KENALOG) 0.1 % Apply 1 application  topically 2 (two) times daily.   No facility-administered encounter medications on file as of 10/15/2020.    Patient Active Problem List   Diagnosis Date Noted  . Lab test positive for detection of COVID-19 virus 09/27/2020  . Dysphagia 08/31/2020  . Atherosclerosis of aorta ([place]) 12/04/2019  . Persistent atrial fibrillation ([place]) 09/12/2019  . CAD (coronary artery disease) 09/12/2019  . Chronic venous stasis 09/12/2019  . Hoarding disorder 09/12/2019  . Cervical spinal stenosis 09/12/2019  . Diabetic retinopathy ([place]) 09/12/2019  . COPD, mild ([place]) 09/12/2019  . Osteoporosis 09/09/2019  . History of prostate cancer 09/09/2019  . Anxiety 08/10/2019  . Obstructive sleep apnea on CPAP 08/10/2019  . Hyperlipidemia associated with type 2 diabetes mellitus ([place]) 08/10/2019  . Chronic diastolic CHF (congestive heart failure) ([place]) 01/09/2014  . Esophageal dysmotility 09/12/2013  . Type 2 diabetes mellitus with proteinuria ([place]) 02/14/2012  . Obesity, morbid (more than 100 lbs over ideal weight or BMI > 40) ([place]) 07/26/2011  . OLD MYOCARDIAL INFARCTION 03/02/2010  . Hypertension associated with diabetes ([place]) 03/20/2009  . CORONARY ATHEROSCLEROSIS, ARTERY BYPASS GRAFT 03/20/2009    Conditions to be addressed/monitored:CAD, HTN, HLD, Anxiety and Upcoming colonoscopy  Care Plan : RNCM: Coronary Artery Disease (Adult) and HLD  Updates made by [name] since 10/15/2020 12:00 AM    Problem: RNCM: Disease Progression (Coronary Artery Disease) and HLD   Priority: Medium    Long-Range Goal: RNCM: Disease Progression Prevented or Minimized   Priority: Medium  Note:   Current Barriers:  . Poorly controlled hyperlipidemia, complicated by anxiety, HF, HTN . Current antihyperlipidemic regimen: Crestor 40 mg QD, Zetia 10 mg QD . Most recent lipid panel:     Component Value Date/Time   CHOL 256 (H) 10/06/2020 1153   CHOL 194 12/30/2013 0413   TRIG 139 10/06/2020 1153   TRIG 158  12/30/2013 0413   HDL 57 10/06/2020 1153   HDL 42 12/30/2013 0413   CHOLHDL 4 09/12/2013 1035   VLDL 32 12/30/2013 0413   LDLCALC 174 (H) 10/06/2020 1153   LDLCALC 120 (H) 12/30/2013 0413   LDLDIRECT 178.5 07/26/2011 1234 .   Marland Kitchen ASCVD risk enhancing conditions: age >68, DM, HTN, CHF . Unable to independently manage HLD and CAD . Lacks social connections . Does not maintain contact with provider office . Does not contact provider office for questions/concerns RN Care Manager Clinical Goal(s):  . patient will work with Consulting civil engineer, providers, and care team towards execution of optimized self-health management plan . patient will verbalize understanding of plan for effective management of HLD and CAD . patient will work with [facility] and pcp  to address needs related to HLD and CAD . patient will attend all scheduled medical appointments: 01-07-2021 at 11 am Interventions: . Collaboration with [name], NP regarding development and update of comprehensive plan of care as evidenced by provider attestation and co-signature . Inter-disciplinary care team collaboration (see longitudinal plan of care) . Medication  review performed; medication list updated in electronic medical record.  Bertram Savin care team collaboration (see longitudinal plan of care) . Referred to pharmacy team for assistance with HLD and CAD medication management . Evaluation of current treatment plan related to HLD and CAD and patient's adherence to plan as established by provider. . Advised patient to call the office for changes in conditions or questions  . Provided education to patient re: heart healthy diet and weight loss . Reviewed medications with patient and discussed compliance. The patient has started taking Zetia 10 mg daily to his Crestor 40 mg.  The patient verbalized understanding.  . Reviewed scheduled/upcoming provider appointments including: 01-07-2021 at 11 am . Discussed plans with patient  for ongoing care management follow up and provided patient with direct contact information for care management team Patient Goals/Self-Care Activities: - call for medicine refill 2 or 3 days before it runs out - call if I am sick and can't take my medicine - keep a list of all the medicines I take; vitamins and herbals too - learn to read medicine labels - use a pillbox to sort medicine - use an alarm clock or phone to remind me to take my medicine - change to whole grain breads, cereal, pasta - drink 6 to 8 glasses of water each day - eat 3 to 5 servings of fruits and vegetables each day - eat 5 or 6 small meals each day - fill half the plate with nonstarchy vegetables - limit fast food meals to no more than 1 per week - manage portion size - prepare main meal at home 3 to 5 days each week - read food labels for fat, fiber, carbohydrates and portion size - set a realistic goal - be open to making changes - I can manage, know and watch for signs of a heart attack - if I have chest pain, call for help - learn about small changes that will make a big difference - learn my personal risk factors - barriers to treatment adherence reviewed and addressed - difficulty of making life-long changes acknowledged - functional limitation screening reviewed - healthy lifestyle promoted - medication-adherence assessment completed - medication side effects managed - rescue (action) plan developed - response to pharmacologic therapy monitored - self-awareness of signs/symptoms of worsening disease encouraged Follow Up Plan: Telephone follow up appointment with care management team member scheduled for: 01-04-2021 at 0900 am     Task: RNCM: Alleviate Barriers to Coronary Artery Disease Therapy   Note:   Care Management Activities:    - barriers to treatment adherence reviewed and addressed - difficulty of making life-long changes acknowledged - functional limitation screening reviewed - healthy  lifestyle promoted - medication-adherence assessment completed - medication side effects managed - rescue (action) plan developed - response to pharmacologic therapy monitored - self-awareness of signs/symptoms of worsening disease encouraged       Care Plan : RNCM: Hypertension (Adult)  Updates made by [name] since 10/15/2020 12:00 AM    Problem: RNCM: Hypertension (Hypertension)   Priority: Medium    Long-Range Goal: RNCM: Hypertension Monitored   Priority: Medium  Note:   Objective:  . Last practice recorded BP readings:  BP Readings from Last 3 Encounters:  10/06/20 136/78  08/31/20 (!) 177/96  08/28/20 (!) 137/93 .   Marland Kitchen Most recent eGFR/CrCl: No results found for: EGFR  No components found for: CRCL Current Barriers:  Marland Kitchen Knowledge Deficits related to basic understanding of hypertension pathophysiology and self  care management . Knowledge Deficits related to understanding of medications prescribed for management of hypertension . Limited Social Support . Lacks social connections . Does not maintain contact with provider office . Does not contact provider office for questions/concerns Case Manager Clinical Goal(s):  Marland Kitchen Over the next 120 days, patient will verbalize understanding of plan for hypertension management . Over the next 120 days, patient will attend all scheduled medical appointments: 01-07-2021 at 11 am . Over the next 120 days, patient will demonstrate improved adherence to prescribed treatment plan for hypertension as evidenced by taking all medications as prescribed, monitoring and recording blood pressure as directed, adhering to low sodium/DASH diet . Over the next 120 days, patient will demonstrate improved health management independence as evidenced by checking blood pressure as directed and notifying PCP if SBP>160 or DBP > 90, taking all medications as prescribe, and adhering to a low sodium diet as discussed. . Over the next 120 days, patient will  verbalize basic understanding of hypertension disease process and self health management plan as evidenced by compliance with medications, compliance with diet, working with CCM team to manage health and well being.  Interventions:  . Collaboration with [name], NP regarding development and update of comprehensive plan of care as evidenced by provider attestation and co-signature . Inter-disciplinary care team collaboration (see longitudinal plan of care) . Evaluation of current treatment plan related to hypertension self management and patient's adherence to plan as established by provider. . Provided education to patient re: stroke prevention, s/s of heart attack and stroke, DASH diet, complications of uncontrolled blood pressure . Reviewed medications with patient and discussed importance of compliance . Discussed plans with patient for ongoing care management follow up and provided patient with direct contact information for care management team . Advised patient, providing education and rationale, to monitor blood pressure daily and record, calling PCP for findings outside established parameters.  . Reviewed scheduled/upcoming provider appointments including: 01-07-2021  at 11 am Patient Goals: - blood pressure equipment and technique reviewed - blood pressure trends reviewed - depression screen reviewed - home or ambulatory blood pressure monitoring encouraged Self-Care Activities: - Self administers medications as prescribed Attends all scheduled provider appointments Calls provider office for new concerns, questions, or BP outside discussed parameters Checks BP and records as discussed Follows a low sodium diet/DASH diet Follow Up Plan: Telephone follow up appointment with care management team member scheduled for: 01-04-2021 at 0900 am   Task: RNCM: Identify and Monitor Blood Pressure Elevation   Note:   Care Management Activities:    - blood pressure equipment and  technique reviewed - blood pressure trends reviewed - depression screen reviewed - home or ambulatory blood pressure monitoring encouraged       Care Plan : RNCM: Anxiety  Updates made by [name] since 10/15/2020 12:00 AM    Problem: RNCM: Anxiety   Priority: Medium    Long-Range Goal: RNCM: Anxiety   Priority: Medium  Note:   Current Barriers:  Marland Kitchen Knowledge Deficits related to resources for anxiety and meeting the patients needs  . Chronic Disease Management support and education needs related to effective management of anxiety . Lacks caregiver support.  Leodis Liverpool social connections . Does not maintain contact with provider office . Does not contact provider office for questions/concerns  Nurse Case Manager Clinical Goal(s):  . patient will verbalize understanding of plan for effective management of anxiety . patient will work with [facility] team and pcp  to  address needs related to effective management of anxiety  . patient will attend all scheduled medical appointments: 01-07-2021  Interventions:  . 1:1 collaboration with [name], NP regarding development and update of comprehensive plan of care as evidenced by provider attestation and co-signature . Inter-disciplinary care team collaboration (see longitudinal plan of care) . Evaluation of current treatment plan related to anxiety  and patient's adherence to plan as established by provider. . Advised patient to call office for changes in mood/anxiety/depression or questions  . Provided education to patient re: anxiety and divisional activities, working with the CCM team for resources to help with managing anxiety.  . Reviewed medications with patient and discussed compliance  . Reviewed scheduled/upcoming provider appointments including:  . Discussed plans with patient for ongoing care management follow up and provided patient with direct contact information for care management team  Patient Goals/Self-Care  Activities Over the next 120 days, patient will:  - Patient will self administer medications as prescribed Patient will attend all scheduled provider appointments Patient will call pharmacy for medication refills Patient will attend church or other social activities Patient will continue to perform ADL's independently Patient will continue to perform IADL's independently Patient will call provider office for new concerns or questions Patient will work with BSW to address care coordination needs and will continue to work with the clinical team to address health care and disease management related needs.   - calendar and clock use encouraged - cognitive-stimulating activities promoted - consistent daily routine encouraged - extra time for response allowed - medication list reviewed - memory aid use encouraged - regular activity or exercise promoted - repetition utilized - social relationships promoted - written communication utilized  Follow Up Plan: Telephone follow up appointment with care management team member scheduled for: 01-04-2021 at 0900 am       Task: RNCM: Identify and Optimize Mental Processes   Note:   Care Management Activities:    - calendar and clock use encouraged - cognitive-stimulating activities promoted - consistent daily routine encouraged - extra time for response allowed - medication list reviewed - memory aid use encouraged - regular activity or exercise promoted - repetition utilized - social relationships promoted - written communication utilized         Plan:Telephone follow up appointment with care management team member scheduled for:  01-04-2021 at 0900 am  Escudilla Bonita, MSN, [facility] Mobile: [phone]

## 2020-10-18 ENCOUNTER — Telehealth: Payer: Self-pay

## 2020-10-18 ENCOUNTER — Telehealth: Payer: Self-pay | Admitting: Gastroenterology

## 2020-10-18 ENCOUNTER — Other Ambulatory Visit: Payer: Self-pay | Admitting: Nurse Practitioner

## 2020-10-18 DIAGNOSIS — K224 Dyskinesia of esophagus: Secondary | ICD-10-CM

## 2020-10-18 NOTE — Telephone Encounter (Signed)
Patient verbally understood

## 2020-10-18 NOTE — Telephone Encounter (Signed)
Copied from [place] [id number]. Topic: General - Call Back - No Documentation >> Oct 18, 2020 12:54 PM Erick Blinks wrote: Reason for CRM: Pt called to report that he does not have anyone to be present with him during his colonoscopy, his procedure states that he must have someone there with him but he does not have anyone. Pt also wants to know if he absolutely must have the colonoscopy, or not. Please advise  Best contact: [phone]

## 2020-10-18 NOTE — Telephone Encounter (Signed)
Thank you, placed referral

## 2020-10-18 NOTE — Telephone Encounter (Signed)
Cancel his procedure for now, patient having transportation issues and does not have a care giver right now.  Patient will call back to reschedule .

## 2020-10-18 NOTE — Telephone Encounter (Signed)
Can we assist with this Evan [name]?  He does need this screening, it is important.  Is there any caregiver assist or someone who could be present with him?  Salena Saner, tell him we do want him to have this and I will talk with Evan [name] and team to see what we can do.

## 2020-10-19 ENCOUNTER — Telehealth: Payer: Self-pay | Admitting: *Deleted

## 2020-10-19 NOTE — Telephone Encounter (Signed)
   Telephone encounter was:  Unsuccessful.  10/19/2020 Name: Evan [name] MRN: [id number] DOB: March 19, 1956  Unsuccessful outbound call made today to assist with:  want a advocate to go sit in his colonscopy  Outreach Attempt:  1st Attempt  A HIPAA compliant voice message was left requesting a return call.   Instructed patient to call back at [phone]  at their earliest convenience.  [place], Care Management  [phone] [address] , [place] [address] Email : [email or web address]

## 2020-10-19 NOTE — Telephone Encounter (Signed)
Procedure has been cancelled.

## 2020-10-20 ENCOUNTER — Other Ambulatory Visit: Payer: Medicare Other

## 2020-10-20 ENCOUNTER — Telehealth: Payer: Self-pay | Admitting: *Deleted

## 2020-10-20 NOTE — Telephone Encounter (Signed)
   Telephone encounter was:  Successful.  10/20/2020 Name: KHING BELCHER MRN: [id number] DOB: [birth year]  [name] Sarabia is a 65 y.o. year old male who is a primary care patient of [name], [name], NP . The community resource team was consulted for assistance with Transportation Needs   Care guide performed the following interventions: Patient provided with information about care guide support team and interviewed to confirm resource needs.  Follow Up Plan:  Care guide will follow up with patient by phone over the next few weeks   [place], Care Management  [phone] [address] , [place] [address] Email : [email or web address]

## 2020-10-21 ENCOUNTER — Ambulatory Visit: Payer: Self-pay | Admitting: Licensed Clinical Social Worker

## 2020-10-21 DIAGNOSIS — Z658 Other specified problems related to psychosocial circumstances: Secondary | ICD-10-CM

## 2020-10-21 NOTE — Telephone Encounter (Signed)
This came to the wrong office. Thanks

## 2020-10-21 NOTE — Chronic Care Management (AMB) (Signed)
  Care Management   Follow Up Note   10/21/2020 Name: Evan [name] MRN: [id number] DOB: [birth year]  Referred by: [name], NP Reason for referral : No chief complaint on file.   Evan [name] is a 65 y.o. year old male who is a primary care patient of [name], Henrine Screws T, NP. The care management team was consulted for assistance with care management and care coordination needs.    Review of patient status, including review of consultants reports, relevant laboratory and other test results, and collaboration with appropriate care team members and the patient's provider was performed as part of comprehensive patient evaluation and provision of chronic care management services.    C3 referral completed per PCP request. Patient has no one to go with him to his colonoscopy apppointment and will need additional peer support and transportation assistance in order to attend and complete this procedure.   [name], BSW, MSW, [facility] Care Management [email or web address] Phone: [phone]

## 2020-10-21 NOTE — Telephone Encounter (Signed)
Pt is requesting a call back, is requesting to speak to the office regarding his colonoscopy. Requesting a call back

## 2020-10-21 NOTE — Telephone Encounter (Signed)
Thank you Brooke, I had placed one too, but he must not have heard from anyone.  Nurse team please let him know we have referral in and hope to help find someone for him.

## 2020-10-21 NOTE — Telephone Encounter (Signed)
Evan [name] can you see if the C4 team received that referral I placed and if anyway to [place] with having someone with him for colonoscopy?

## 2020-10-22 ENCOUNTER — Telehealth: Payer: Self-pay | Admitting: Gastroenterology

## 2020-10-22 ENCOUNTER — Telehealth: Payer: Self-pay | Admitting: Nurse Practitioner

## 2020-10-22 ENCOUNTER — Encounter: Admission: RE | Payer: Self-pay | Source: Ambulatory Visit

## 2020-10-22 ENCOUNTER — Ambulatory Visit: Admission: RE | Admit: 2020-10-22 | Payer: Medicare Other | Source: Ambulatory Visit | Admitting: Gastroenterology

## 2020-10-22 SURGERY — COLONOSCOPY WITH PROPOFOL
Anesthesia: General

## 2020-10-22 NOTE — Telephone Encounter (Signed)
Updated patient on status of referral.

## 2020-10-22 NOTE — Telephone Encounter (Signed)
   Telephone encounter was:  Successful.  10/22/2020 Name: Evan [name] MRN: [id number] DOB: April 02, 1956  [name] is a 65 y.o. year old male who is a primary care patient of [name], [name], NP . The community resource team was consulted for assistance with Transportation Needs   Care guide performed the following interventions: Discussed resources to assist with colonoscopy. He currently has [place] to take him to the appoinment and to bring him home to stay with him a few hours after the appointment. Please schedule his colonoscopy. Patient is not in need for community resources. .  Follow Up Plan:  Client will call the doctors office to schedule his colonoscopy.  and No further follow up planned at this time. The patient has been provided with needed resources.  [place], Care Management Phone: [phone] Email: [email or web address]

## 2020-10-22 NOTE — Telephone Encounter (Signed)
Please call to reschedule procedure. Patient does not understand about having a care partner to take, stay through [facility] and drive him home.

## 2020-10-26 ENCOUNTER — Ambulatory Visit: Payer: Self-pay | Admitting: Podiatry

## 2020-10-26 ENCOUNTER — Encounter: Payer: Self-pay | Admitting: Podiatry

## 2020-10-26 ENCOUNTER — Other Ambulatory Visit: Payer: Self-pay

## 2020-10-26 ENCOUNTER — Ambulatory Visit (INDEPENDENT_AMBULATORY_CARE_PROVIDER_SITE_OTHER): Payer: Medicare Other | Admitting: Podiatry

## 2020-10-26 DIAGNOSIS — M79675 Pain in left toe(s): Secondary | ICD-10-CM | POA: Diagnosis not present

## 2020-10-26 DIAGNOSIS — R809 Proteinuria, unspecified: Secondary | ICD-10-CM | POA: Diagnosis not present

## 2020-10-26 DIAGNOSIS — B351 Tinea unguium: Secondary | ICD-10-CM | POA: Diagnosis not present

## 2020-10-26 DIAGNOSIS — M79674 Pain in right toe(s): Secondary | ICD-10-CM | POA: Diagnosis not present

## 2020-10-26 DIAGNOSIS — E1129 Type 2 diabetes mellitus with other diabetic kidney complication: Secondary | ICD-10-CM | POA: Diagnosis not present

## 2020-10-26 NOTE — Telephone Encounter (Signed)
Hey Dijah,   Please let patient know this is third time rescheduling and after that it is up to Dr. Vicente Males if he will keep him as a patient

## 2020-10-26 NOTE — Progress Notes (Signed)
  Subjective:  Patient ID: Evan [name], male    DOB: 07-Oct-1955,  MRN: [id number]  Chief Complaint  Patient presents with  . Nail Problem  . Diabetes    Nail trim [facility]   65 y.o. male returns for the above complaint.  Patient presents with thickened elongated dystrophic toenails x10.  Patient is a diabetic.  He would like to do exam to make sure that his feet are doing good.  His A1c is 6.4.  He has been pretty well controlled diabetic.  He denies any other acute complaints.  He would like to have the nails debrided down.  Objective:  There were no vitals filed for this visit. Podiatric Exam: Vascular: dorsalis pedis and posterior tibial pulses are palpable bilateral. Capillary return is immediate. Temperature gradient is WNL. Skin turgor WNL  Sensorium: Normal Semmes Weinstein monofilament test. Normal tactile sensation bilaterally. Nail Exam: Pt has thick disfigured discolored nails with subungual debris noted bilateral entire nail hallux through fifth toenails.  Pain on palpation to the nails. Ulcer Exam: There is no evidence of ulcer or pre-ulcerative changes or infection. Orthopedic Exam: Muscle tone and strength are WNL. No limitations in general ROM. No crepitus or effusions noted. HAV  B/L.  Hammer toes 2-5  B/L. Skin: No Porokeratosis. No infection or ulcers    Assessment & Plan:   1. Pain due to onychomycosis of toenails of both feet   2. Type 2 diabetes mellitus with proteinuria (HCC)     Patient was evaluated and treated and all questions answered.  Onychomycosis with pain  -Nails palliatively debrided as below. -Educated on self-care  Procedure: Nail Debridement Rationale: pain  Type of Debridement: manual, sharp debridement. Instrumentation: Nail nipper, rotary burr. Number of Nails: 10  Procedures and Treatment: Consent by patient was obtained for treatment procedures. The patient understood the discussion of treatment and procedures well. All questions were  answered thoroughly reviewed. Debridement of mycotic and hypertrophic toenails, 1 through 5 bilateral and clearing of subungual debris. No ulceration, no infection noted.  Return Visit-Office Procedure: Patient instructed to return to the office for a follow up visit 3 months for continued evaluation and treatment.  Boneta Lucks, DPM    No follow-ups on file.

## 2020-10-27 ENCOUNTER — Encounter: Payer: Self-pay | Admitting: Nurse Practitioner

## 2020-10-27 DIAGNOSIS — E113293 Type 2 diabetes mellitus with mild nonproliferative diabetic retinopathy without macular edema, bilateral: Secondary | ICD-10-CM | POA: Diagnosis not present

## 2020-10-27 LAB — HM DIABETES EYE EXAM

## 2020-11-04 ENCOUNTER — Telehealth: Payer: Self-pay

## 2020-11-04 NOTE — Telephone Encounter (Signed)
Called patient to inform him I called his care giver to schedule the procedure. LVM to call office back.

## 2020-11-05 ENCOUNTER — Other Ambulatory Visit: Payer: Self-pay

## 2020-11-05 DIAGNOSIS — R1310 Dysphagia, unspecified: Secondary | ICD-10-CM

## 2020-11-05 DIAGNOSIS — Z1211 Encounter for screening for malignant neoplasm of colon: Secondary | ICD-10-CM

## 2020-11-05 MED ORDER — PEG 3350-KCL-NA BICARB-NACL 420 G PO SOLR: 4000.0000 mL | Freq: Once | ORAL | 0 refills | Status: AC

## 2020-11-05 NOTE — Progress Notes (Signed)
Spoke with [name] patients caregiver. Informed her of the patients procedure date. I explained to her I will mail instructions and send it through my chart. I will call Staci and send a patient message to hold his blood thinner medication.

## 2020-11-06 ENCOUNTER — Emergency Department: Payer: Medicare Other

## 2020-11-06 ENCOUNTER — Observation Stay (HOSPITAL_BASED_OUTPATIENT_CLINIC_OR_DEPARTMENT_OTHER)
Admit: 2020-11-06 | Discharge: 2020-11-06 | Disposition: A | Discharge status: Home / Self Care | Payer: Medicare Other | Attending: Internal Medicine | Admitting: Internal Medicine

## 2020-11-06 ENCOUNTER — Observation Stay: Payer: Medicare Other

## 2020-11-06 ENCOUNTER — Observation Stay
Admission: EM | Admit: 2020-11-06 | Discharge: 2020-11-07 | Disposition: A | Discharge status: Home / Self Care | Payer: Medicare Other | Attending: Internal Medicine | Admitting: Internal Medicine

## 2020-11-06 ENCOUNTER — Other Ambulatory Visit: Payer: Self-pay

## 2020-11-06 DIAGNOSIS — Z79899 Other long term (current) drug therapy: Secondary | ICD-10-CM | POA: Insufficient documentation

## 2020-11-06 DIAGNOSIS — M5126 Other intervertebral disc displacement, lumbar region: Secondary | ICD-10-CM | POA: Diagnosis not present

## 2020-11-06 DIAGNOSIS — J019 Acute sinusitis, unspecified: Secondary | ICD-10-CM | POA: Diagnosis not present

## 2020-11-06 DIAGNOSIS — J45909 Unspecified asthma, uncomplicated: Secondary | ICD-10-CM | POA: Insufficient documentation

## 2020-11-06 DIAGNOSIS — I110 Hypertensive heart disease with heart failure: Secondary | ICD-10-CM | POA: Diagnosis not present

## 2020-11-06 DIAGNOSIS — I5032 Chronic diastolic (congestive) heart failure: Secondary | ICD-10-CM | POA: Diagnosis not present

## 2020-11-06 DIAGNOSIS — R200 Anesthesia of skin: Secondary | ICD-10-CM | POA: Diagnosis not present

## 2020-11-06 DIAGNOSIS — I6389 Other cerebral infarction: Secondary | ICD-10-CM

## 2020-11-06 DIAGNOSIS — I639 Cerebral infarction, unspecified: Principal | ICD-10-CM | POA: Insufficient documentation

## 2020-11-06 DIAGNOSIS — I10 Essential (primary) hypertension: Secondary | ICD-10-CM | POA: Diagnosis present

## 2020-11-06 DIAGNOSIS — Z7901 Long term (current) use of anticoagulants: Secondary | ICD-10-CM | POA: Diagnosis not present

## 2020-11-06 DIAGNOSIS — M48061 Spinal stenosis, lumbar region without neurogenic claudication: Secondary | ICD-10-CM | POA: Diagnosis not present

## 2020-11-06 DIAGNOSIS — Z20822 Contact with and (suspected) exposure to covid-19: Secondary | ICD-10-CM | POA: Insufficient documentation

## 2020-11-06 DIAGNOSIS — I152 Hypertension secondary to endocrine disorders: Secondary | ICD-10-CM | POA: Diagnosis present

## 2020-11-06 DIAGNOSIS — E1169 Type 2 diabetes mellitus with other specified complication: Secondary | ICD-10-CM

## 2020-11-06 DIAGNOSIS — Z8673 Personal history of transient ischemic attack (TIA), and cerebral infarction without residual deficits: Secondary | ICD-10-CM | POA: Diagnosis present

## 2020-11-06 DIAGNOSIS — Z9989 Dependence on other enabling machines and devices: Secondary | ICD-10-CM

## 2020-11-06 DIAGNOSIS — G319 Degenerative disease of nervous system, unspecified: Secondary | ICD-10-CM | POA: Diagnosis not present

## 2020-11-06 DIAGNOSIS — R0689 Other abnormalities of breathing: Secondary | ICD-10-CM | POA: Diagnosis not present

## 2020-11-06 DIAGNOSIS — R6889 Other general symptoms and signs: Secondary | ICD-10-CM | POA: Diagnosis not present

## 2020-11-06 DIAGNOSIS — Z7984 Long term (current) use of oral hypoglycemic drugs: Secondary | ICD-10-CM | POA: Insufficient documentation

## 2020-11-06 DIAGNOSIS — J449 Chronic obstructive pulmonary disease, unspecified: Secondary | ICD-10-CM | POA: Diagnosis not present

## 2020-11-06 DIAGNOSIS — Z7982 Long term (current) use of aspirin: Secondary | ICD-10-CM | POA: Diagnosis not present

## 2020-11-06 DIAGNOSIS — E119 Type 2 diabetes mellitus without complications: Secondary | ICD-10-CM | POA: Insufficient documentation

## 2020-11-06 DIAGNOSIS — E1159 Type 2 diabetes mellitus with other circulatory complications: Secondary | ICD-10-CM | POA: Diagnosis present

## 2020-11-06 DIAGNOSIS — Z8546 Personal history of malignant neoplasm of prostate: Secondary | ICD-10-CM | POA: Insufficient documentation

## 2020-11-06 DIAGNOSIS — G4733 Obstructive sleep apnea (adult) (pediatric): Secondary | ICD-10-CM

## 2020-11-06 DIAGNOSIS — R202 Paresthesia of skin: Secondary | ICD-10-CM | POA: Diagnosis not present

## 2020-11-06 DIAGNOSIS — I2510 Atherosclerotic heart disease of native coronary artery without angina pectoris: Secondary | ICD-10-CM | POA: Diagnosis not present

## 2020-11-06 DIAGNOSIS — I4819 Other persistent atrial fibrillation: Secondary | ICD-10-CM | POA: Diagnosis present

## 2020-11-06 DIAGNOSIS — Z8616 Personal history of COVID-19: Secondary | ICD-10-CM | POA: Insufficient documentation

## 2020-11-06 DIAGNOSIS — Z743 Need for continuous supervision: Secondary | ICD-10-CM | POA: Diagnosis not present

## 2020-11-06 LAB — GLUCOSE, CAPILLARY
Glucose-Capillary: 126 mg/dL — ABNORMAL HIGH (ref 70–99)
Glucose-Capillary: 120 mg/dL — ABNORMAL HIGH (ref 70–99)

## 2020-11-06 LAB — CBC
HCT: 45.6 % (ref 39.0–52.0)
Hemoglobin: 14.5 g/dL (ref 13.0–17.0)
MCH: 27.7 pg (ref 26.0–34.0)
MCHC: 31.8 g/dL (ref 30.0–36.0)
MCV: 87 fL (ref 80.0–100.0)
Platelets: 259 10*3/uL (ref 150–400)
RBC: 5.24 MIL/uL (ref 4.22–5.81)
RDW: 16 % — ABNORMAL HIGH (ref 11.5–15.5)
WBC: 8.4 10*3/uL (ref 4.0–10.5)
nRBC: 0 % (ref 0.0–0.2)

## 2020-11-06 LAB — URINE DRUG SCREEN, QUALITATIVE (ARMC ONLY)
Amphetamines, Ur Screen: NOT DETECTED
Barbiturates, Ur Screen: NOT DETECTED
Benzodiazepine, Ur Scrn: NOT DETECTED
Cannabinoid 50 Ng, Ur ~~LOC~~: NOT DETECTED
Cocaine Metabolite,Ur ~~LOC~~: NOT DETECTED
MDMA (Ecstasy)Ur Screen: NOT DETECTED
Methadone Scn, Ur: NOT DETECTED
Opiate, Ur Screen: NOT DETECTED
Phencyclidine (PCP) Ur S: NOT DETECTED
Tricyclic, Ur Screen: NOT DETECTED

## 2020-11-06 LAB — BASIC METABOLIC PANEL
Anion gap: 9 (ref 5–15)
BUN: 17 mg/dL (ref 8–23)
CO2: 21 mmol/L — ABNORMAL LOW (ref 22–32)
Calcium: 8.9 mg/dL (ref 8.9–10.3)
Chloride: 105 mmol/L (ref 98–111)
Creatinine, Ser: 1.08 mg/dL (ref 0.61–1.24)
GFR, Estimated: >60 mL/min (ref >60)
Glucose, Bld: 165 mg/dL — ABNORMAL HIGH (ref 70–99)
Potassium: 4 mmol/L (ref 3.5–5.1)
Sodium: 135 mmol/L (ref 135–145)

## 2020-11-06 LAB — CBG MONITORING, ED
Glucose-Capillary: 180 mg/dL — ABNORMAL HIGH (ref 70–99)
Glucose-Capillary: 128 mg/dL — ABNORMAL HIGH (ref 70–99)

## 2020-11-06 LAB — MAGNESIUM: Magnesium: 2.3 mg/dL (ref 1.7–2.4)

## 2020-11-06 LAB — URINALYSIS, ROUTINE W REFLEX MICROSCOPIC
Bilirubin Urine: NEGATIVE
Glucose, UA: NEGATIVE mg/dL
Hgb urine dipstick: NEGATIVE
Ketones, ur: NEGATIVE mg/dL
Leukocytes,Ua: NEGATIVE
Nitrite: NEGATIVE
Protein, ur: NEGATIVE mg/dL
Specific Gravity, Urine: 1.019 (ref 1.005–1.030)
pH: 6 (ref 5.0–8.0)

## 2020-11-06 LAB — VITAMIN B12: Vitamin B-12: 212 pg/mL (ref 180–914)

## 2020-11-06 LAB — PROTIME-INR
INR: 1 (ref 0.8–1.2)
Prothrombin Time: 13 seconds (ref 11.4–15.2)

## 2020-11-06 LAB — HEMOGLOBIN A1C
Hgb A1c MFr Bld: 6.8 % — ABNORMAL HIGH (ref 4.8–5.6)
Mean Plasma Glucose: 148.46 mg/dL

## 2020-11-06 LAB — APTT: aPTT: 30 seconds (ref 24–36)

## 2020-11-06 LAB — HIV ANTIBODY (ROUTINE TESTING W REFLEX): HIV Screen 4th Generation wRfx: NONREACTIVE

## 2020-11-06 LAB — RESP PANEL BY RT-PCR (FLU A&B, COVID) ARPGX2
Influenza A by PCR: NEGATIVE
Influenza B by PCR: NEGATIVE
SARS Coronavirus 2 by RT PCR: NEGATIVE

## 2020-11-06 LAB — TSH: TSH: 2.567 u[IU]/mL (ref 0.350–4.500)

## 2020-11-06 IMAGING — CT CT ANGIO NECK
2 of 8 series · 8 of 33 positions shown · IV contrast (APPLIED)
Comparison: MR head without contrast/[DATE]

CLINICAL DATA: Bilateral lower extremity numbness and heaviness
since waking up at 3 a.m. Recent episode of asymptomatic COVID.
Right thalamic nonhemorrhagic infarct.

EXAM:
CT ANGIOGRAPHY HEAD AND NECK
TECHNIQUE: Multidetector CT imaging of the head and neck was performed using
the standard protocol during bolus administration of intravenous
contrast. Multiplanar CT image reconstructions and MIPs were
obtained to evaluate the vascular anatomy. Carotid stenosis
measurements (when applicable) are obtained utilizing NASCET
criteria, using the distal internal carotid diameter as the
denominator.
CONTRAST:  75mL OMNIPAQUE IOHEXOL 350 MG/ML SOLN

[Series 4: cta head neck · axial · 0.64mm/px · z∈[+316,+440]mm · 2 of 187 slices shown]
[im 63/187  soft-tissue]
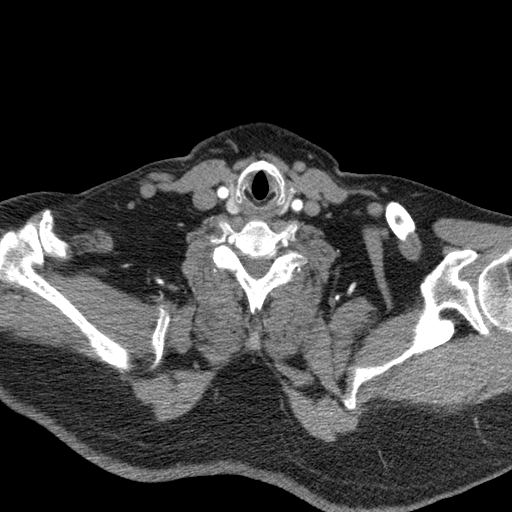
[im 125/187  soft-tissue]
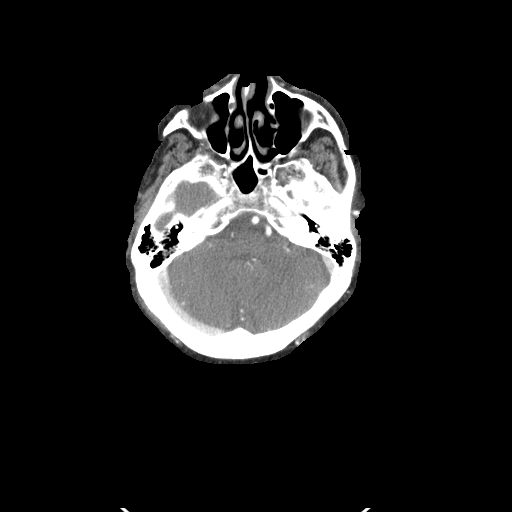

[Series 8: ax thin · axial · 0.38mm/px · z∈[+225,+491]mm · 6 of 377 slices shown]
[im 54/377  soft-tissue]
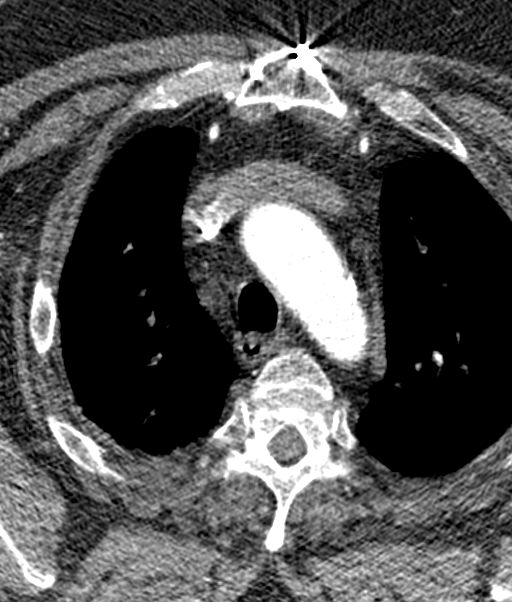
[im 108/377  bone]
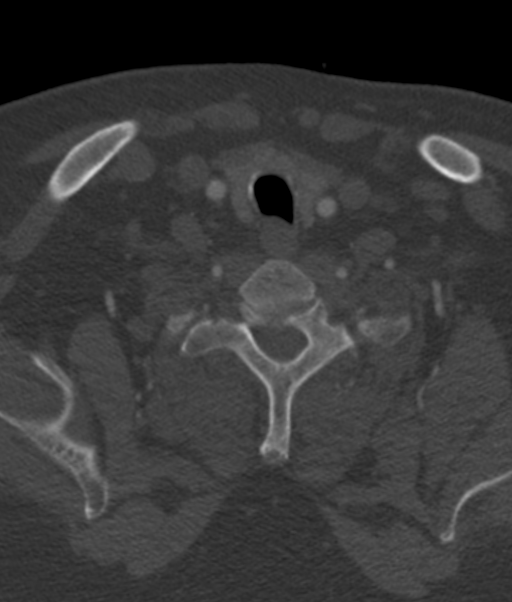
[im 162/377  soft-tissue]
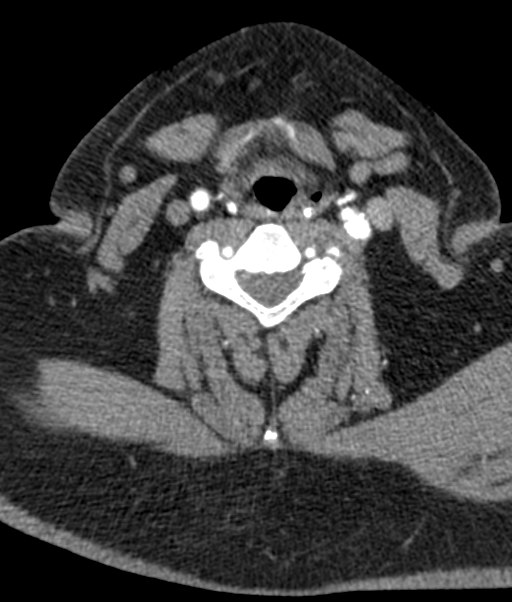
[im 215/377  bone]
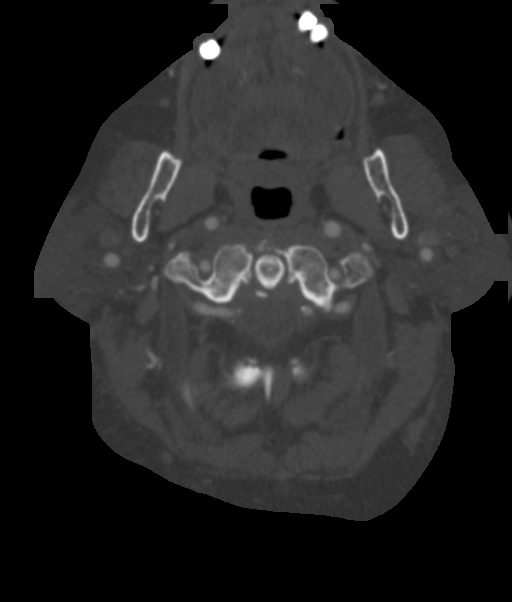
[im 269/377  soft-tissue]
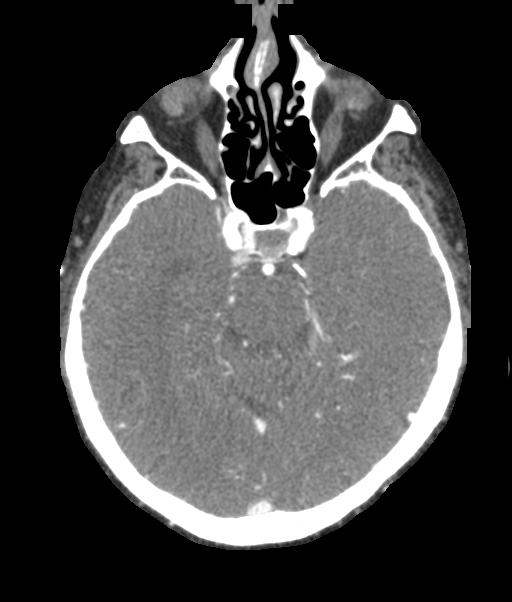
[im 323/377  bone]
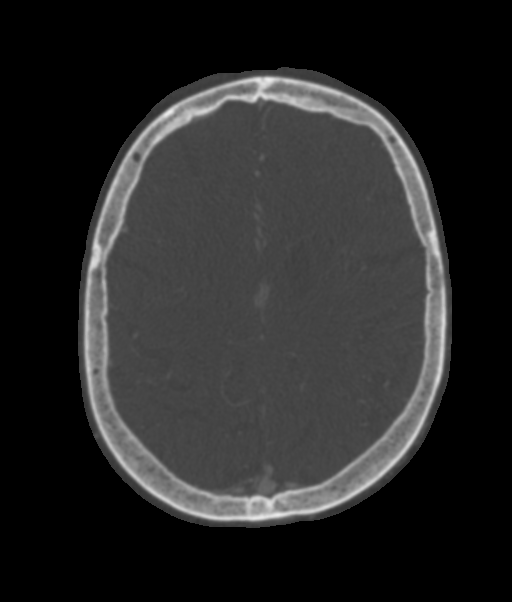

[8 of 33 positions shown; findings below may reference images not displayed]

FINDINGS: CT HEAD FINDINGS

Brain: The acute/subacute nonhemorrhagic infarct of the right
thalamus is again noted. Mild generalized atrophy and moderate
diffuse white matter disease is stable. No new cortical infarcts
present. Basal ganglia are otherwise intact. Brainstem and
cerebellum within normal limits.

Vascular: Atherosclerotic calcifications are present within the
cavernous internal carotid arteries bilaterally and at the dural
margin of both vertebral arteries. No hyperdense vessel is present.

Skull: Calvarium is intact. No focal lytic or blastic lesions are
present. No significant extracranial soft tissue lesion is present.

Sinuses: The right frontal sinus is hypoplastic. Mild anterior right
ethmoid sinus disease is present. The paranasal sinuses and mastoid
air cells are otherwise clear.

Orbits: Bilateral lens replacements are noted. Globes and orbits are
otherwise unremarkable.

Review of the MIP images confirms the above findings

CTA NECK FINDINGS

Aortic arch: 3 vessel arch configuration is present. Atherosclerotic
changes are present the distal aorta. There is no significant
stenosis at the great vessel origins. No aneurysm is present.

Right carotid system: The right common carotid artery is within
normal limits. Dense calcification is present at the bifurcation.
Lumen is narrowed to scratched at the right ICA lumen is narrowed to
2 mm. This compares with distal measurement of 5.5 mm.

Left carotid system: The left common carotid artery is within normal
limits. Atherosclerotic changes are present at the bifurcation.
Additional mural calcifications are present slightly more distally
in the left ICA without significant stenosis or pseudoaneurysm.

Vertebral arteries: The vertebral arteries are codominant. Both
vertebral arteries originate from the subclavian arteries without
significant stenosis. Calcifications are present bilaterally. No
significant stenosis is present in either vertebral artery in the
neck.

Skeleton: Degenerative changes are present at C1-2. Posterior arch
of C1 is anterior, narrowing the spinal canal. No acute or healing
fractures are present. Vertebral body heights are otherwise normal.
Patient is status post median sternotomy. No focal lytic or blastic
lesions are present.

Other neck: 6 mm incidental posterior left thyroid nodule noted.
Soft tissues the neck are otherwise unremarkable. Salivary glands
within limits. No significant adenopathy is present. No focal
mucosal lesions.

Upper chest: Scattered ground-glass attenuation both lungs likely
represents mild edema. Coronary artery calcifications are present.
Patient is status post CABG.

Review of the MIP images confirms the above findings

CTA HEAD FINDINGS

Anterior circulation: The internal carotid arteries demonstrates
some atherosclerotic calcifications bilaterally without significant
stenosis through the ICA termini. The A1 and M1 segments are normal.
Right A1 segment is dominant. Anterior communicating artery is
patent. MCA bifurcations are within normal limits bilaterally. The
ACA and MCA branch vessels are within normal limits.

Posterior circulation: Atherosclerotic changes are present at the
dural margin of both vertebral arteries. Narrowing 50% present on
the right. Left PICA origin is visualized and normal. Right AICA is
dominant. Vertebrobasilar junction is normal. The basilar artery is
limits. Left posterior cerebral artery is of fetal type with a small
left P1 contribution. There is mild narrowing proximal right P1
segment. The distal PCA branch vessels are within normal limits
bilaterally.

Venous sinuses: The dural sinuses are patent. The straight sinus
deep cerebral veins are intact. Cortical veins are unremarkable.

Anatomic variants: Fetal type left posterior cerebral artery.

Review of the MIP images confirms the above findings
IMPRESSION: 1. Stable appearance of acute/subacute nonhemorrhagic infarct of the
right thalamus.
2. Stable atrophy and moderate diffuse white matter disease.
3. 60% stenosis of the proximal right internal carotid artery at the
bifurcation.
4. Atherosclerotic changes at the left carotid bifurcation and
cavernous internal carotid arteries bilaterally without significant
stenosis.
5. Atherosclerotic changes at the dural margin of both vertebral
arteries without to 50% narrowing on the right.
6. Mild narrowing of the proximal right P1 segment. This could
impact the thalamostriate perforators.
7. No other significant proximal stenosis, aneurysm, or branch
vessel occlusion within the Circle of Willis.
8. Aortic Atherosclerosis ([NH]-[NH]).

## 2020-11-06 IMAGING — MR MR LUMBAR SPINE W/O CM
5 series · 34 of 48 positions shown · non-contrast
Comparison: None.

CLINICAL DATA: Numbness or tingling with paresthesia.

EXAM:
MRI LUMBAR SPINE WITHOUT CONTRAST
TECHNIQUE: Multiplanar, multisequence MR imaging of the lumbar spine was
performed. No intravenous contrast was administered.

[Series 5: T2 · sagittal · 4.0mm · 1.02mm/px · 7 of 15 slices shown (1 of 2)]
[im 1/15]
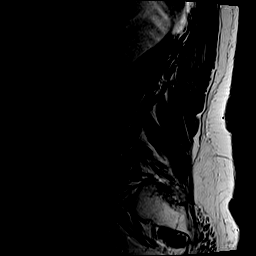
[im 3/15]
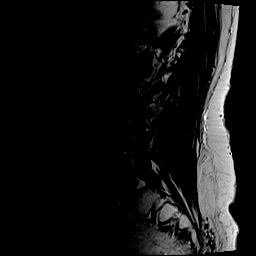
[im 5/15]
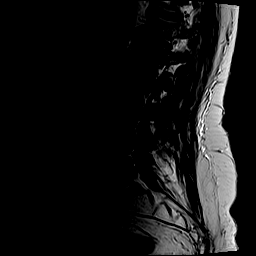
[im 8/15]
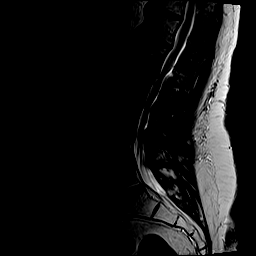
[im 10/15]
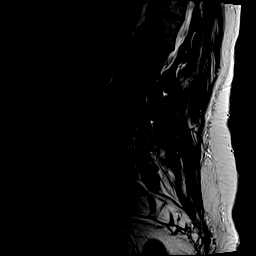
[im 12/15]
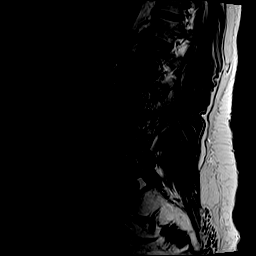
[im 15/15]
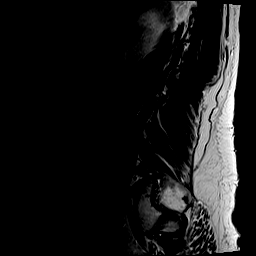

[Series 6: T1 · sagittal · 4.0mm · 1.02mm/px · 7 of 15 slices shown (1 of 2)]
[im 1/15]
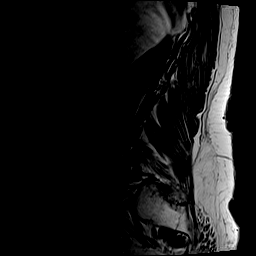
[im 3/15]
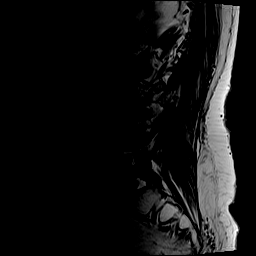
[im 5/15]
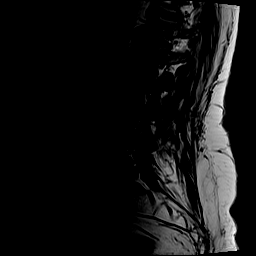
[im 8/15]
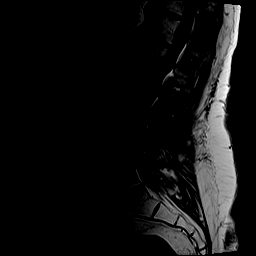
[im 10/15]
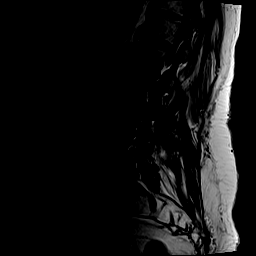
[im 12/15]
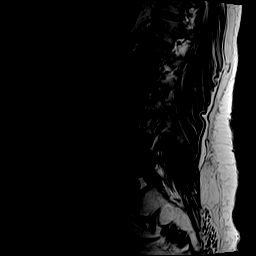
[im 15/15]
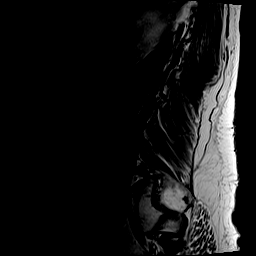

[Series 7: STIR · sagittal · 4.0mm · 0.51mm/px · 4 of 15 slices shown]
[im 1/15]
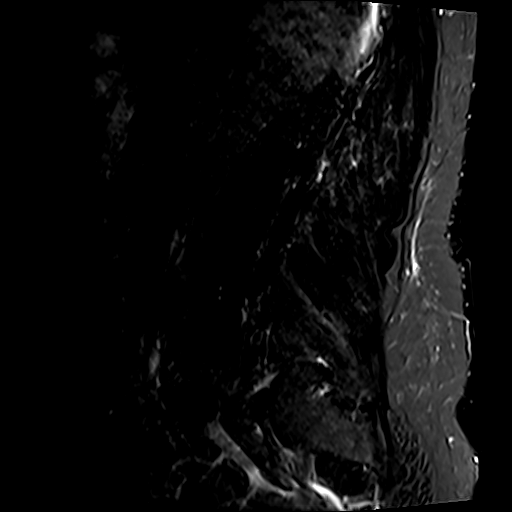
[im 3/15]
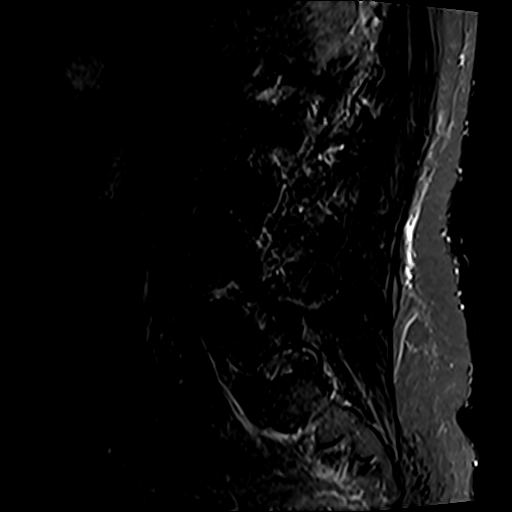
[im 6/15]
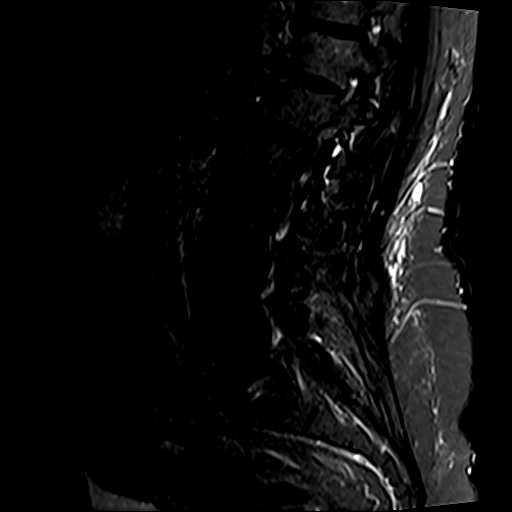
[im 9/15]
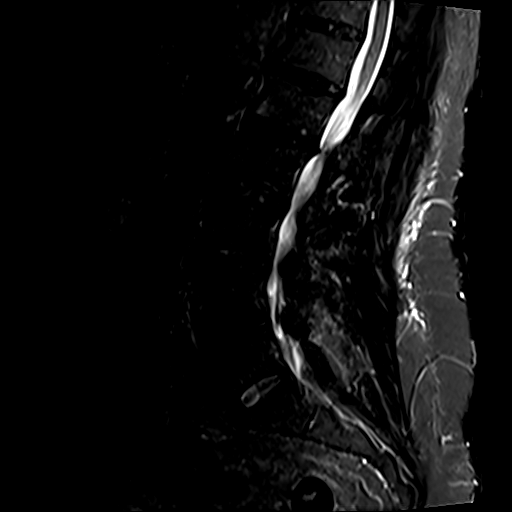

[Series 8: T2 · axial · 4.0mm · 0.78mm/px · z∈[-156,+57]mm · 8 of 34 slices shown (2 of 2)]
[im 1/34]
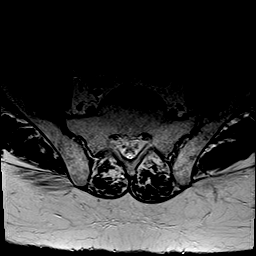
[im 6/34]
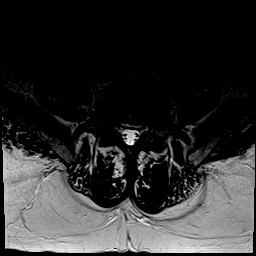
[im 11/34]
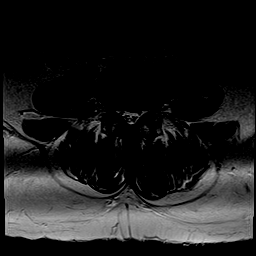
[im 16/34]
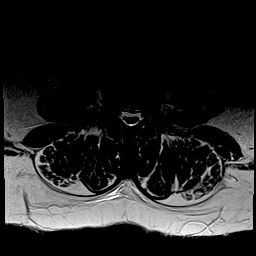
[im 18/34]
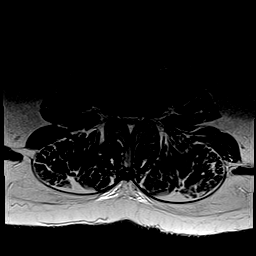
[im 23/34]
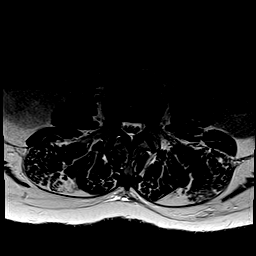
[im 28/34]
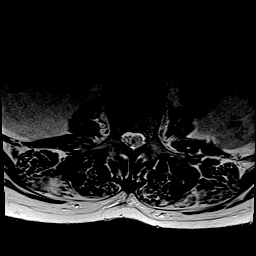
[im 34/34]
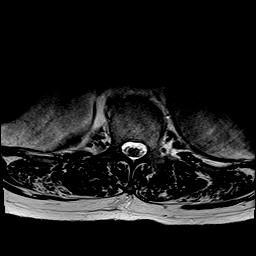

[Series 9: T1 · axial · 4.0mm · 0.39mm/px · z∈[-156,+57]mm · 8 of 34 slices shown (2 of 2)]
[im 1/34]
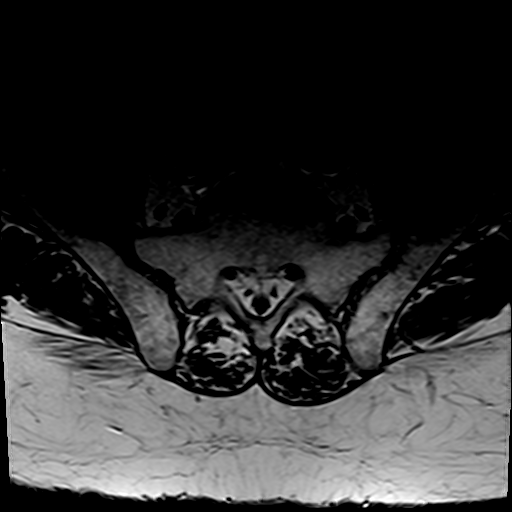
[im 6/34]
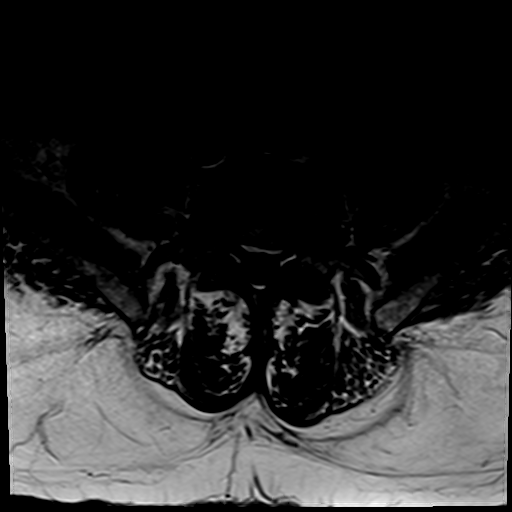
[im 11/34]
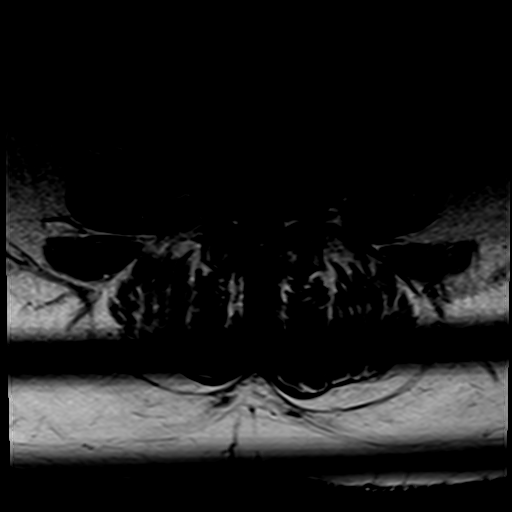
[im 16/34]
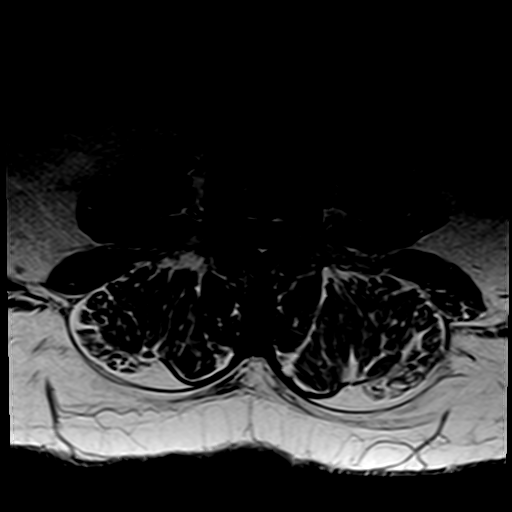
[im 18/34]
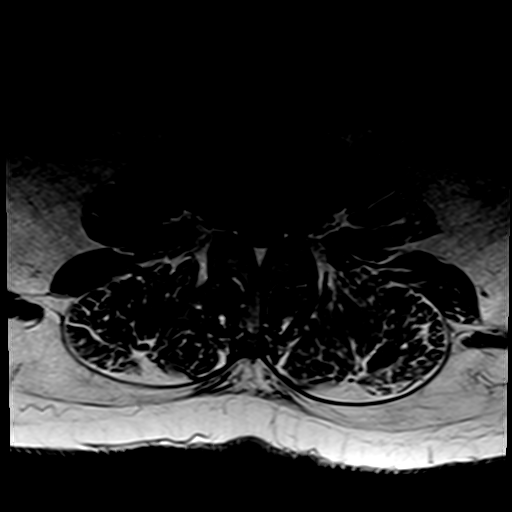
[im 23/34]
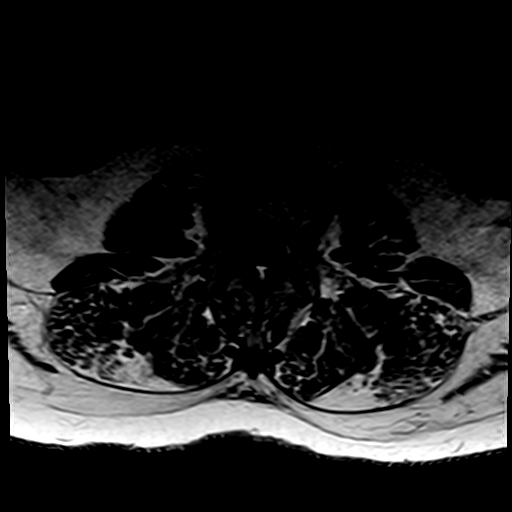
[im 28/34]
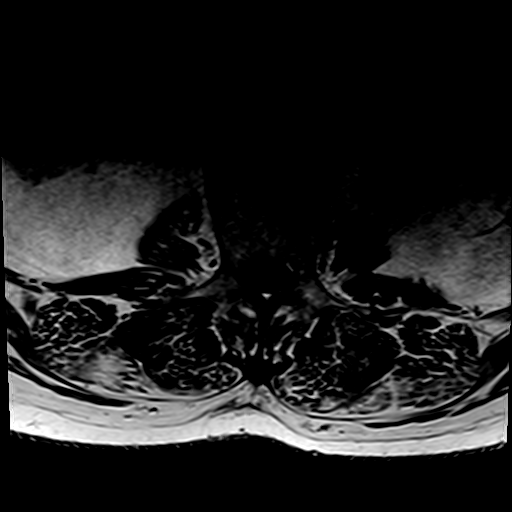
[im 34/34]
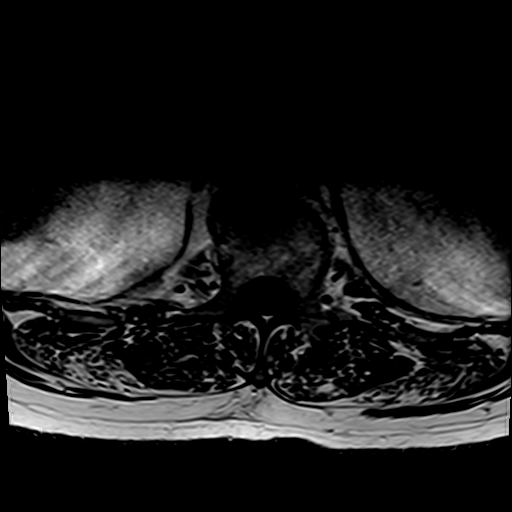

[34 of 48 positions shown; findings below may reference images not displayed]

FINDINGS: Segmentation: 5 lumbar type vertebrae based on the available
coverage

Alignment:  Mild anterolisthesis at L4-5

Vertebrae: Dense fatty marrow at L5 and the sacrum. The appearance
usually relates to prior radiotherapy and likely relates to history
of prostate cancer. No fracture, discitis, or aggressive bone
lesion.

Conus medullaris and cauda equina: Conus extends to the L1 level.
Conus and cauda equina appear normal.

Paraspinal and other soft tissues: No acute finding

Disc levels:

Degenerative changes are superimposed on a congenitally narrow
spinal canal from short pedicles

T12- L1: Ventral spondylitic spurring

L1-L2: Disc narrowing and bulging with central protrusion. Mild
thickening of the ligamentum flavum. Spinal stenosis appears
advanced on sagittal images but more moderate on axial slices.
Patent foramina

L2-L3: Disc narrowing and mild bulging.

L3-L4: Disc narrowing and bulging. Facet spurring and ligamentum
flavum thickening. Moderate spinal stenosis.

L4-L5: Disc narrowing and bulging. Facet osteoarthritis with
spurring and mild anterolisthesis. Moderate left foraminal narrowing
with facet spur touching the L4 nerve root.

L5-S1:Minor facet spurring
IMPRESSION: 1. No acute finding.
2. Lumbar spine degeneration which is exacerbated by short pedicles.
3. Spinal stenosis that is moderate to advanced at L1-2 and moderate
at L3-4.
4. Up to moderate foraminal narrowing on the left at L5-S1.

## 2020-11-06 IMAGING — CT CT ANGIO HEAD
1 of 11 series · 5 of 35 positions shown · IV contrast (APPLIED)
Comparison: MR head without contrast/[DATE]

CLINICAL DATA: Bilateral lower extremity numbness and heaviness
since waking up at 3 a.m. Recent episode of asymptomatic COVID.
Right thalamic nonhemorrhagic infarct.

EXAM:
CT ANGIOGRAPHY HEAD AND NECK
TECHNIQUE: Multidetector CT imaging of the head and neck was performed using
the standard protocol during bolus administration of intravenous
contrast. Multiplanar CT image reconstructions and MIPs were
obtained to evaluate the vascular anatomy. Carotid stenosis
measurements (when applicable) are obtained utilizing NASCET
criteria, using the distal internal carotid diameter as the
denominator.
CONTRAST:  75mL OMNIPAQUE IOHEXOL 350 MG/ML SOLN

[Series 8: ax thin · axial · 0.38mm/px · z∈[+234,+482]mm · 5 of 377 slices shown]
[im 63/377  soft-tissue]
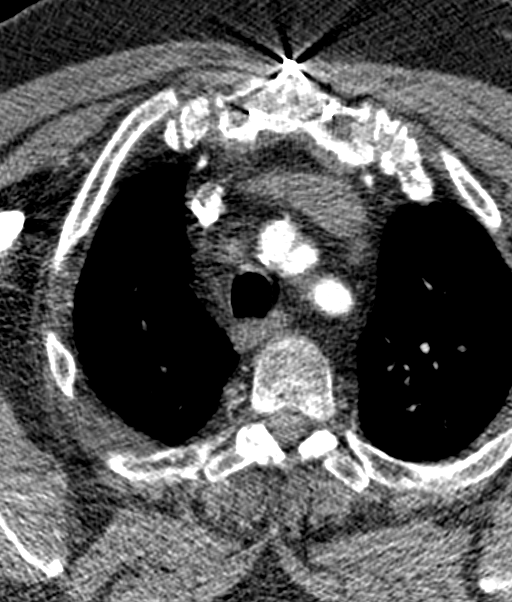
[im 126/377  bone]
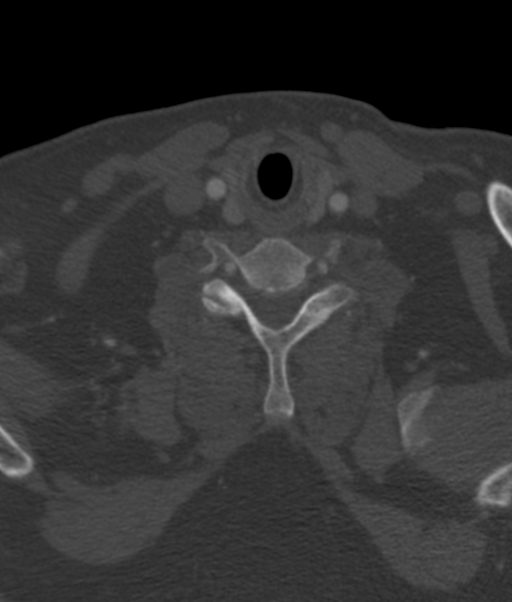
[im 189/377  soft-tissue]
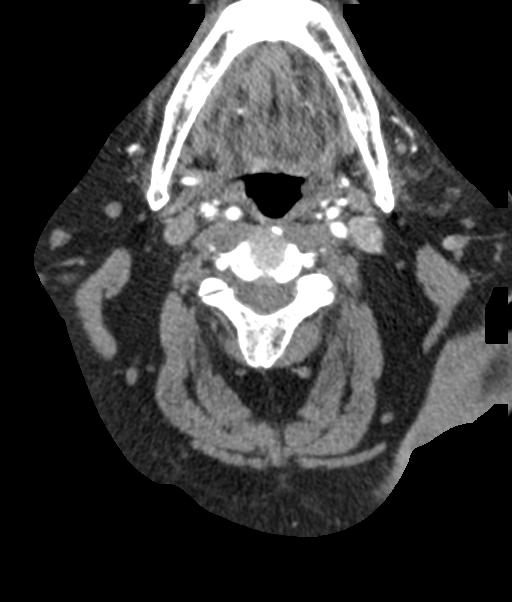
[im 251/377  bone]
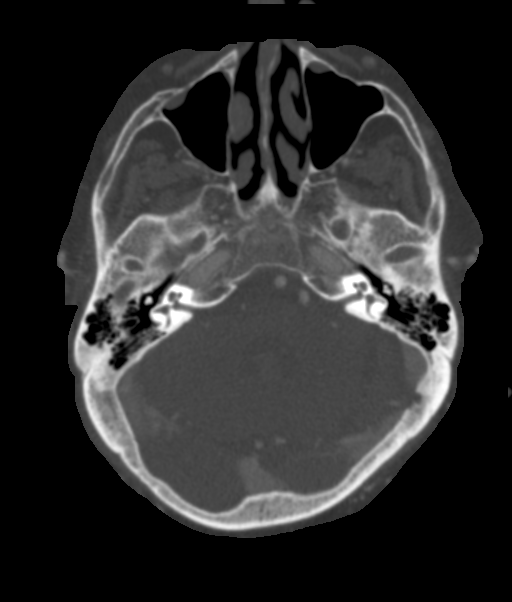
[im 314/377  soft-tissue]
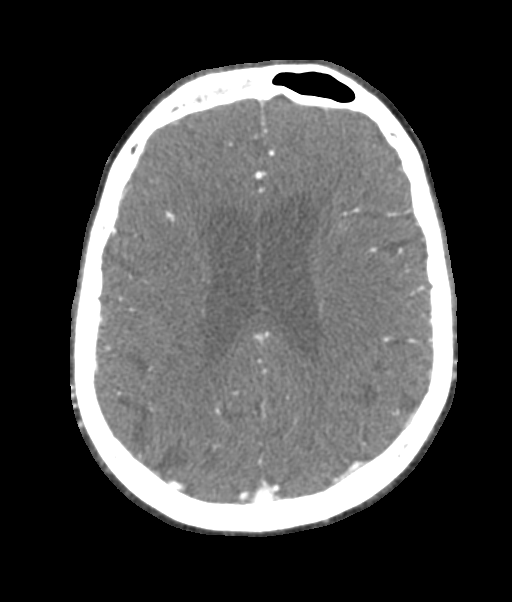

[5 of 35 positions shown; findings below may reference images not displayed]

FINDINGS: CT HEAD FINDINGS

Brain: The acute/subacute nonhemorrhagic infarct of the right
thalamus is again noted. Mild generalized atrophy and moderate
diffuse white matter disease is stable. No new cortical infarcts
present. Basal ganglia are otherwise intact. Brainstem and
cerebellum within normal limits.

Vascular: Atherosclerotic calcifications are present within the
cavernous internal carotid arteries bilaterally and at the dural
margin of both vertebral arteries. No hyperdense vessel is present.

Skull: Calvarium is intact. No focal lytic or blastic lesions are
present. No significant extracranial soft tissue lesion is present.

Sinuses: The right frontal sinus is hypoplastic. Mild anterior right
ethmoid sinus disease is present. The paranasal sinuses and mastoid
air cells are otherwise clear.

Orbits: Bilateral lens replacements are noted. Globes and orbits are
otherwise unremarkable.

Review of the MIP images confirms the above findings

CTA NECK FINDINGS

Aortic arch: 3 vessel arch configuration is present. Atherosclerotic
changes are present the distal aorta. There is no significant
stenosis at the great vessel origins. No aneurysm is present.

Right carotid system: The right common carotid artery is within
normal limits. Dense calcification is present at the bifurcation.
Lumen is narrowed to scratched at the right ICA lumen is narrowed to
2 mm. This compares with distal measurement of 5.5 mm.

Left carotid system: The left common carotid artery is within normal
limits. Atherosclerotic changes are present at the bifurcation.
Additional mural calcifications are present slightly more distally
in the left ICA without significant stenosis or pseudoaneurysm.

Vertebral arteries: The vertebral arteries are codominant. Both
vertebral arteries originate from the subclavian arteries without
significant stenosis. Calcifications are present bilaterally. No
significant stenosis is present in either vertebral artery in the
neck.

Skeleton: Degenerative changes are present at C1-2. Posterior arch
of C1 is anterior, narrowing the spinal canal. No acute or healing
fractures are present. Vertebral body heights are otherwise normal.
Patient is status post median sternotomy. No focal lytic or blastic
lesions are present.

Other neck: 6 mm incidental posterior left thyroid nodule noted.
Soft tissues the neck are otherwise unremarkable. Salivary glands
within limits. No significant adenopathy is present. No focal
mucosal lesions.

Upper chest: Scattered ground-glass attenuation both lungs likely
represents mild edema. Coronary artery calcifications are present.
Patient is status post CABG.

Review of the MIP images confirms the above findings

CTA HEAD FINDINGS

Anterior circulation: The internal carotid arteries demonstrates
some atherosclerotic calcifications bilaterally without significant
stenosis through the ICA termini. The A1 and M1 segments are normal.
Right A1 segment is dominant. Anterior communicating artery is
patent. MCA bifurcations are within normal limits bilaterally. The
ACA and MCA branch vessels are within normal limits.

Posterior circulation: Atherosclerotic changes are present at the
dural margin of both vertebral arteries. Narrowing 50% present on
the right. Left PICA origin is visualized and normal. Right AICA is
dominant. Vertebrobasilar junction is normal. The basilar artery is
limits. Left posterior cerebral artery is of fetal type with a small
left P1 contribution. There is mild narrowing proximal right P1
segment. The distal PCA branch vessels are within normal limits
bilaterally.

Venous sinuses: The dural sinuses are patent. The straight sinus
deep cerebral veins are intact. Cortical veins are unremarkable.

Anatomic variants: Fetal type left posterior cerebral artery.

Review of the MIP images confirms the above findings
IMPRESSION: 1. Stable appearance of acute/subacute nonhemorrhagic infarct of the
right thalamus.
2. Stable atrophy and moderate diffuse white matter disease.
3. 60% stenosis of the proximal right internal carotid artery at the
bifurcation.
4. Atherosclerotic changes at the left carotid bifurcation and
cavernous internal carotid arteries bilaterally without significant
stenosis.
5. Atherosclerotic changes at the dural margin of both vertebral
arteries without to 50% narrowing on the right.
6. Mild narrowing of the proximal right P1 segment. This could
impact the thalamostriate perforators.
7. No other significant proximal stenosis, aneurysm, or branch
vessel occlusion within the Circle of Willis.
8. Aortic Atherosclerosis ([NH]-[NH]).

## 2020-11-06 IMAGING — MR MR HEAD W/O CM
8 of 10 series · 36 of 48 positions shown · non-contrast
Comparison: CT head [DATE].

CLINICAL DATA: Numbness or tingling.  Paresthesias.

EXAM:
MRI HEAD WITHOUT CONTRAST
TECHNIQUE: Multiplanar, multiecho pulse sequences of the brain and surrounding
structures were obtained without intravenous contrast.

[Series 2: ax dwi_tracew · axial · 3.0mm · 0.71mm/px · z∈[+7,+157]mm · 8 of 56 slices shown]
[im 1/56]
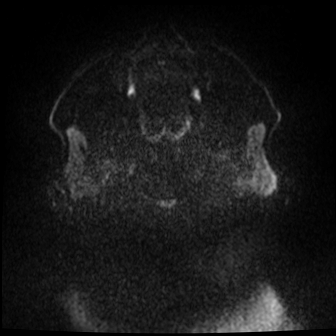
[im 8/56]
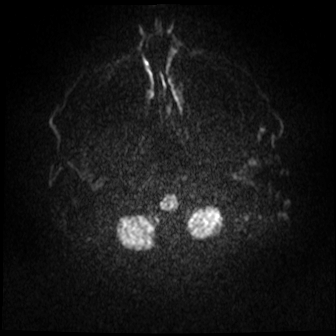
[im 16/56]
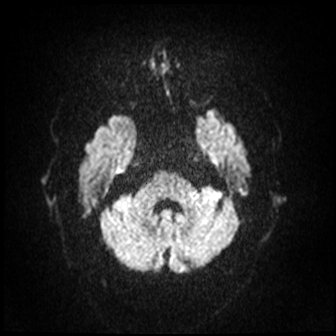
[im 24/56]
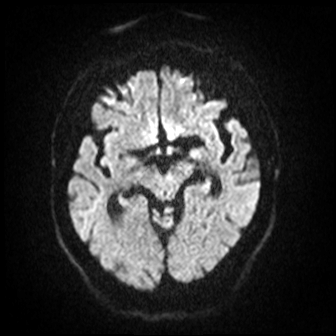
[im 32/56]
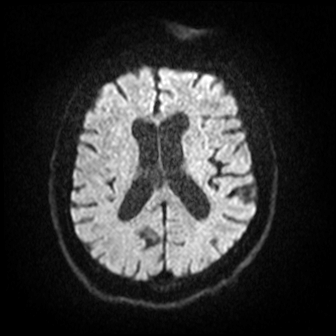
[im 40/56]
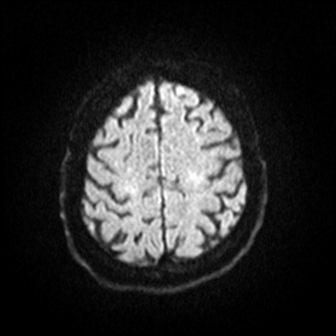
[im 48/56]
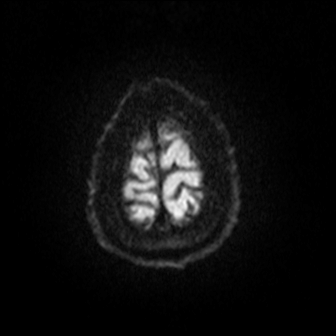
[im 56/56]
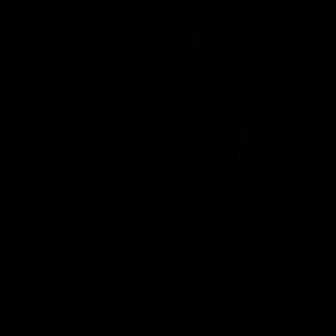

[Series 3: ax dwi_adc · axial · 3.0mm · 0.71mm/px · z∈[+7,+152]mm · 7 of 54 slices shown]
[im 1/54]
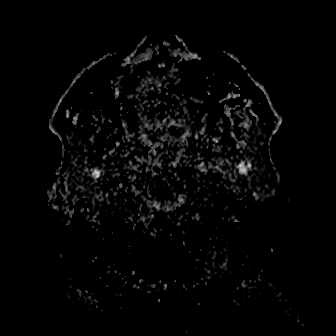
[im 9/54]
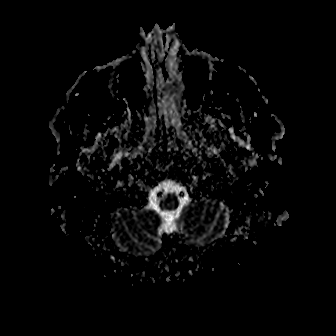
[im 18/54]
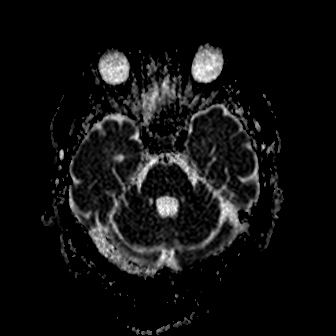
[im 27/54]
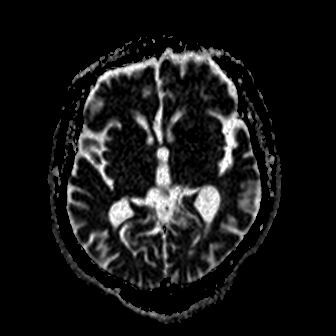
[im 36/54]
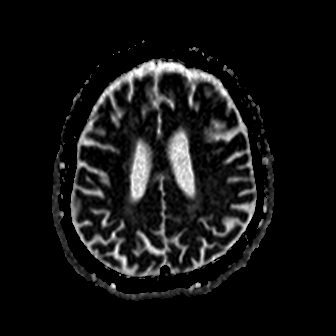
[im 45/54]
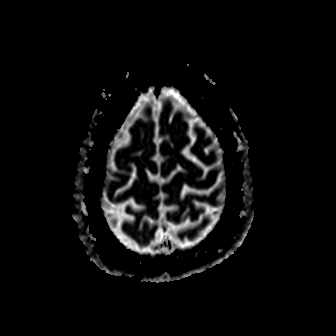
[im 54/54]
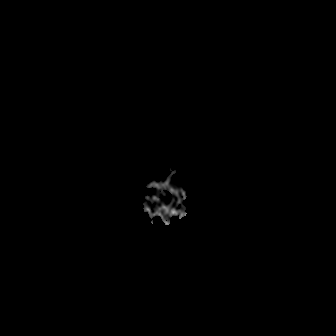

[Series 4: cor dwi_tracew · coronal · 5.0mm · 0.68mm/px · 2 of 40 slices shown]
[im 1/40]
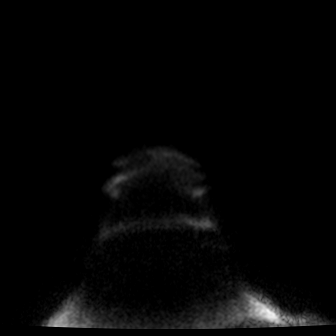
[im 10/40]
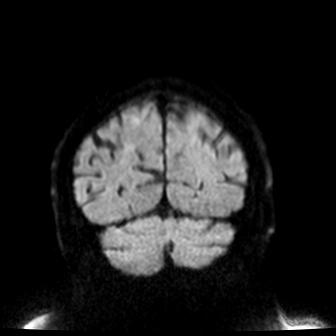

[Series 6: T2 · axial · 5.0mm · 0.45mm/px · z∈[+9,+151]mm · 4 of 27 slices shown (1 of 2)]
[im 1/27]
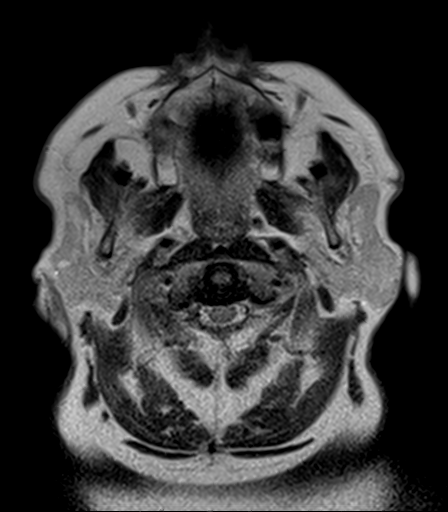
[im 9/27]
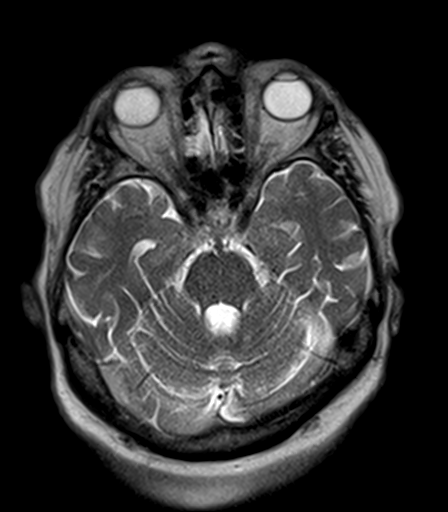
[im 18/27]
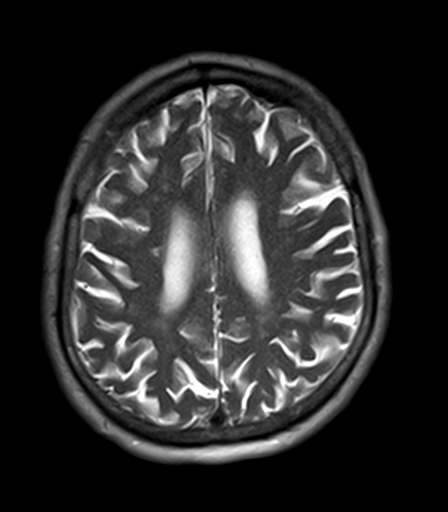
[im 27/27]
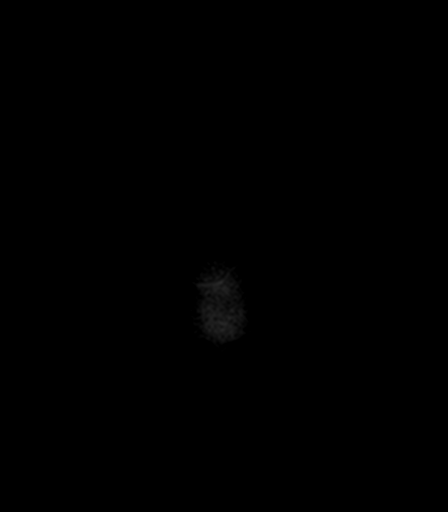

[Series 8: FLAIR · axial · 5.0mm · 1.20mm/px · z∈[+9,+151]mm · 4 of 27 slices shown]
[im 1/27]
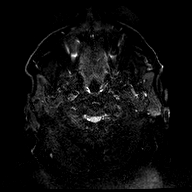
[im 9/27]
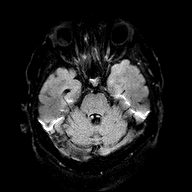
[im 18/27]
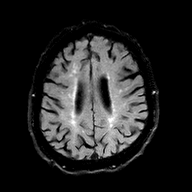
[im 27/27]
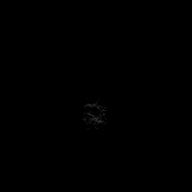

[Series 9: T1 · axial · 5.0mm · 0.90mm/px · z∈[+9,+151]mm · 4 of 27 slices shown (1 of 2)]
[im 1/27]
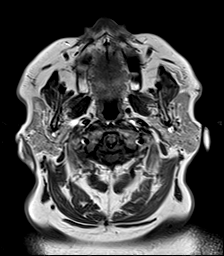
[im 9/27]
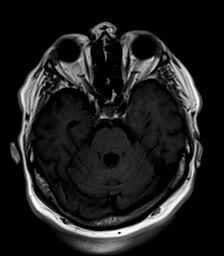
[im 18/27]
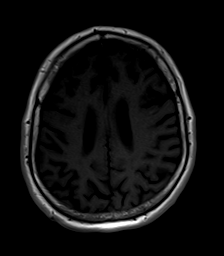
[im 27/27]
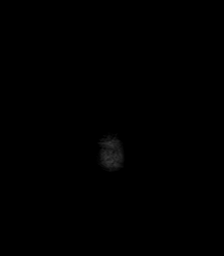

[Series 10: T1 · sagittal · 5.0mm · 0.47mm/px · 3 of 24 slices shown (2 of 2)]
[im 1/24]
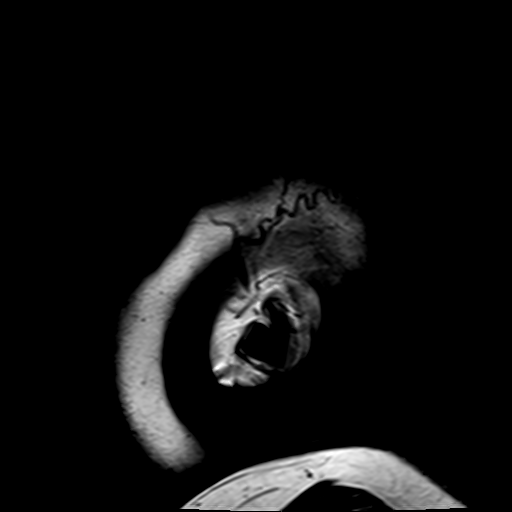
[im 12/24]
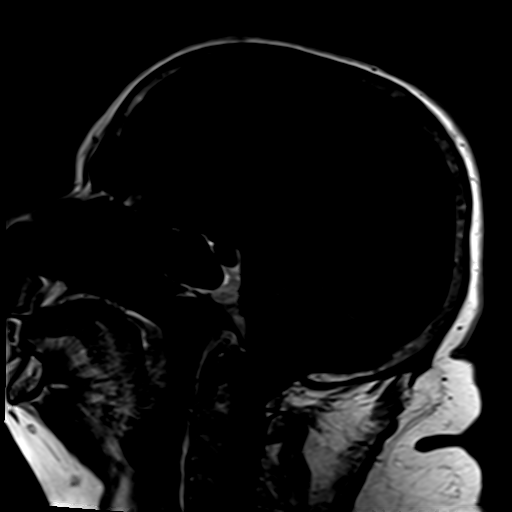
[im 24/24]
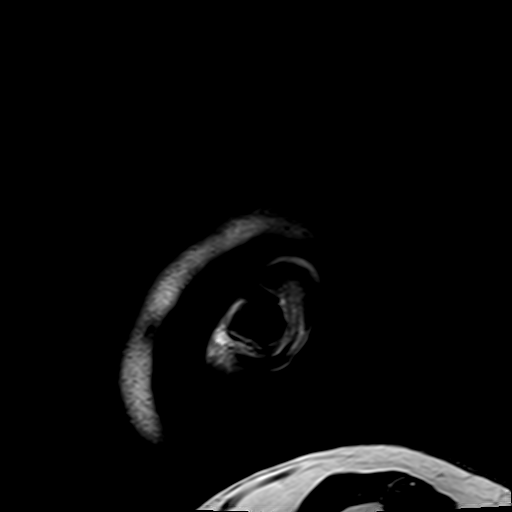

[Series 11: T2 · coronal · 5.0mm · 0.45mm/px · 4 of 33 slices shown (2 of 2)]
[im 1/33]
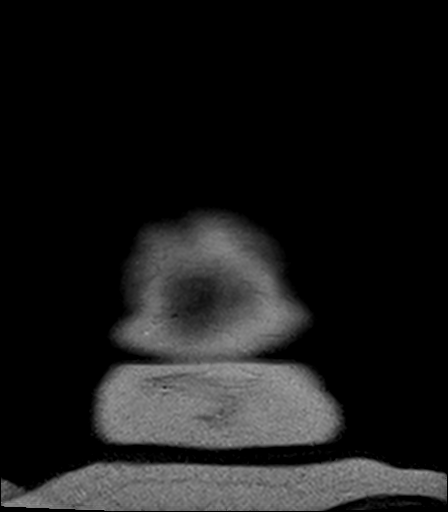
[im 11/33]
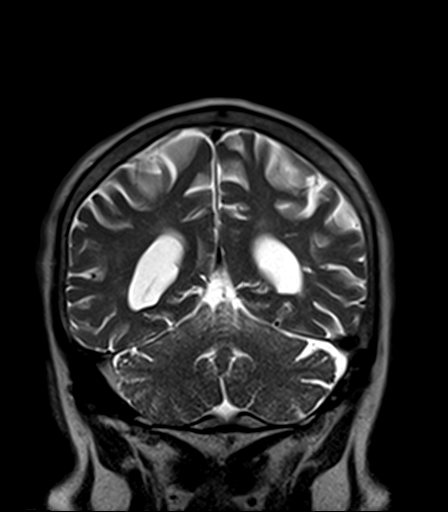
[im 22/33]
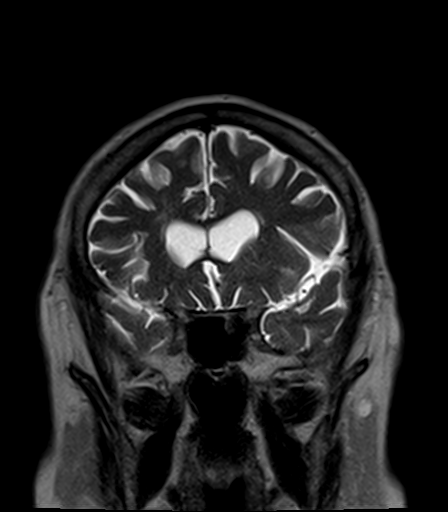
[im 33/33]
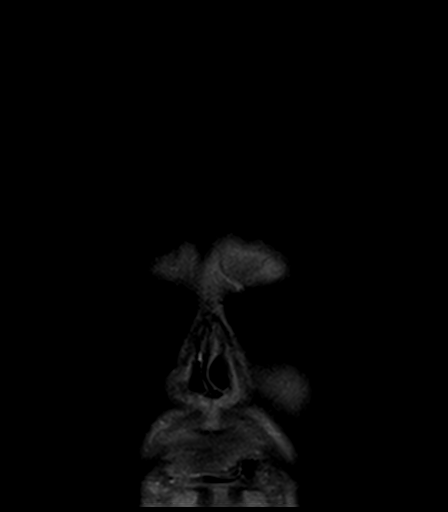

[36 of 48 positions shown; findings below may reference images not displayed]

FINDINGS: Brain: An acute/subacute nonhemorrhagic right thalamic infarct
measures up to 9 mm. T2 seek scratched at T2 hyperintensity
associated.

Periventricular and subcortical T2 hyperintensities bilaterally are
otherwise moderately advanced for age. Mild generalized atrophy is
noted.

Internal auditory canals are within normal limits. A remote lacunar
infarct is present the posterior right cerebellum. Brainstem and
cerebellum are otherwise unremarkable.

Vascular: Flow is present in the major intracranial arteries.

Skull and upper cervical spine: Prominent soft tissue is present at
C1-2 anterolisthesis is present at C1-2 with crowding of the spinal
canal. No abnormal cord signal is present on the coronal T2 weighted
images. Midline structures are otherwise within normal limits.
Marrow signal is normal.

Sinuses/Orbits: Anterior ethmoid air cells are partially opacified
on the right. The right sinus is opacified. The paranasal sinuses
and mastoid air cells are otherwise clear. Bilateral lens
replacements are noted. Globes and orbits are otherwise
unremarkable.
IMPRESSION: 1. Acute/subacute nonhemorrhagic 9 mm right thalamic infarct.
2. Mild generalized atrophy and moderate white matter disease is
advanced for age. This likely reflects the sequela of chronic
microvascular ischemia.
3. Right ethmoid and frontal sinus disease.
4. Prominent soft tissue at C1-2 with crowding of the spinal canal.
No abnormal cord signal is present.

These results were called by telephone at the time of interpretation
on [DATE] at [DATE] to provider SEILER , who verbally
acknowledged these results.

## 2020-11-06 MED ORDER — OMEGA-3-ACID ETHYL ESTERS 1 G PO CAPS
1000.0000 mg | ORAL_CAPSULE | Freq: Every day | ORAL | Status: DC
  Administered 2020-11-06 – 2020-11-07 (×2): 1000 mg via ORAL
  Filled 2020-11-06 (×2): qty 1

## 2020-11-06 MED ORDER — MOMETASONE FURO-FORMOTEROL FUM 100-5 MCG/ACT IN AERO
2.0000 | INHALATION_SPRAY | Freq: Two times a day (BID) | RESPIRATORY_TRACT | Status: DC
  Administered 2020-11-06 – 2020-11-07 (×2): 2 via RESPIRATORY_TRACT
  Filled 2020-11-06: qty 8.8

## 2020-11-06 MED ORDER — IOHEXOL 350 MG/ML SOLN
75.0000 mL | Freq: Once | INTRAVENOUS | Status: AC | PRN
  Administered 2020-11-06: 75 mL via INTRAVENOUS

## 2020-11-06 MED ORDER — ACETAMINOPHEN 325 MG PO TABS: 650.0000 mg | ORAL_TABLET | ORAL | Status: DC | PRN

## 2020-11-06 MED ORDER — STROKE: EARLY STAGES OF RECOVERY BOOK: Freq: Once | Status: DC

## 2020-11-06 MED ORDER — EZETIMIBE 10 MG PO TABS
10.0000 mg | ORAL_TABLET | Freq: Every day | ORAL | Status: DC
  Administered 2020-11-06 – 2020-11-07 (×2): 10 mg via ORAL
  Filled 2020-11-06 (×2): qty 1

## 2020-11-06 MED ORDER — RISPERIDONE 0.5 MG PO TABS
0.5000 mg | ORAL_TABLET | Freq: Two times a day (BID) | ORAL | Status: DC
  Administered 2020-11-06 – 2020-11-07 (×3): 0.5 mg via ORAL
  Filled 2020-11-06 (×4): qty 1

## 2020-11-06 MED ORDER — SODIUM CHLORIDE 0.9 % IV SOLN: INTRAVENOUS | Status: DC

## 2020-11-06 MED ORDER — LISINOPRIL 10 MG PO TABS
10.0000 mg | ORAL_TABLET | ORAL | Status: AC
  Administered 2020-11-06: 10 mg via ORAL
  Filled 2020-11-06: qty 1

## 2020-11-06 MED ORDER — METOPROLOL SUCCINATE ER 50 MG PO TB24
50.0000 mg | ORAL_TABLET | ORAL | Status: AC
  Administered 2020-11-06: 50 mg via ORAL
  Filled 2020-11-06: qty 1

## 2020-11-06 MED ORDER — RIVAROXABAN 20 MG PO TABS
20.0000 mg | ORAL_TABLET | Freq: Every day | ORAL | Status: DC
  Administered 2020-11-06: 21:00:00 20 mg via ORAL
  Filled 2020-11-06 (×3): qty 1

## 2020-11-06 MED ORDER — ASPIRIN 81 MG PO CHEW
81.0000 mg | CHEWABLE_TABLET | Freq: Every day | ORAL | Status: DC
  Administered 2020-11-07: 81 mg via ORAL
  Filled 2020-11-06: qty 1

## 2020-11-06 MED ORDER — PANTOPRAZOLE SODIUM 40 MG PO TBEC
40.0000 mg | DELAYED_RELEASE_TABLET | Freq: Every day | ORAL | Status: DC
  Administered 2020-11-06 – 2020-11-07 (×2): 40 mg via ORAL
  Filled 2020-11-06 (×2): qty 1

## 2020-11-06 MED ORDER — ACETAMINOPHEN 160 MG/5ML PO SOLN
650.0000 mg | ORAL | Status: DC | PRN
  Filled 2020-11-06: qty 20.3

## 2020-11-06 MED ORDER — ACETAMINOPHEN 650 MG RE SUPP: 650.0000 mg | RECTAL | Status: DC | PRN

## 2020-11-06 MED ORDER — ACETAMINOPHEN 500 MG PO TABS
500.0000 mg | ORAL_TABLET | ORAL | Status: AC
  Administered 2020-11-06: 500 mg via ORAL
  Filled 2020-11-06: qty 1

## 2020-11-06 MED ORDER — INSULIN ASPART 100 UNIT/ML ~~LOC~~ SOLN
0.0000 [IU] | Freq: Three times a day (TID) | SUBCUTANEOUS | Status: DC
  Administered 2020-11-06 – 2020-11-07 (×4): 3 [IU] via SUBCUTANEOUS
  Filled 2020-11-06 (×4): qty 1

## 2020-11-06 MED ORDER — VITAMIN D 25 MCG (1000 UNIT) PO TABS
1000.0000 [IU] | ORAL_TABLET | Freq: Every day | ORAL | Status: DC
  Administered 2020-11-06 – 2020-11-07 (×2): 1000 [IU] via ORAL
  Filled 2020-11-06 (×2): qty 1

## 2020-11-06 MED ORDER — ROSUVASTATIN CALCIUM 20 MG PO TABS
40.0000 mg | ORAL_TABLET | Freq: Every day | ORAL | Status: DC
  Administered 2020-11-06 – 2020-11-07 (×2): 40 mg via ORAL
  Filled 2020-11-06 (×2): qty 2

## 2020-11-06 MED ORDER — ASPIRIN 81 MG PO CHEW
324.0000 mg | CHEWABLE_TABLET | Freq: Once | ORAL | Status: AC
  Administered 2020-11-06: 324 mg via ORAL
  Filled 2020-11-06: qty 4

## 2020-11-06 NOTE — Progress Notes (Signed)
*  PRELIMINARY RESULTS* Echocardiogram 2D Echocardiogram has been performed.  Evan [name] 11/06/2020, 3:44 PM

## 2020-11-06 NOTE — ED Triage Notes (Signed)
Pt BIBA from home c/o of bilateral LE numbness. Pt has hx of diabetes and CABG. EMS reports that pt was able to walk to stretcher. On arrival pt was able to stand and shuffle to stretcher.

## 2020-11-06 NOTE — ED Notes (Signed)
Neurologist at bedside. 

## 2020-11-06 NOTE — Consult Note (Signed)
NEUROLOGY CONSULTATION NOTE   Date of service: November 06, 2020 Patient Name: Evan [name] MRN:  [id number] DOB:  [birth year] Reason for consult: subacute R thalamic ischemic stroke on MRI brain _ _ _   _ __   _ __ _ _  __ __   _ __   __ _  History of Present Illness   65 yo man w/ hx CAD, a fib on xarelto (noncompliant), DM2 who presents with waxing and waning numbness in his BLE>BUE x1 wk. He initially told ED that he had developed numbness in his legs last night at 3am that then resolved. He told me that it started one week ago in his legs, and fluctuates, and sometimes at its most severe includes his finger. He denies asymmetry. He denies other focal neurologic deficits, dizziness, headache, speech difficulty, or trouble walking. He displays grandiose ideation throughout the interview, frequently speaking on the phone to friends who are "checking on him and very worried" although nobody actually called his phone when I was there and his phone was playing live video of the morning news. He remarked that he is famous in [place] and the whole town is praying for him to survive his stroke. He also told me he is friends with many celebrities including Doogie Howser. He does not have a psychiatric diagnosis in the chart but is on risperidone as an outpatient.  He initially reported compliance with his xarelto for stroke prevention in setting of persistent a fib but admitted to the hospitalist that he has not been taking it.   ROS   10 point review of systems was performed and was negative except as described in HPI.  Past History   Past Medical History:  Diagnosis Date  . Anginal pain ([place])   . Anxiety disorder   . Asthma   . Atresia of esophagus without fistula   . CAD (coronary artery disease)   . Cellulitis   . CHF (congestive heart failure) ([place])    NYHA CLASS III,CHRONIC,DIASTOLIC  . COPD (chronic obstructive pulmonary disease) ([place])   . Diabetes mellitus without complication ([place])    . Edema    RIGHT LOWER LEG  . Gastroesophageal reflux   . H/O: GI bleed   . History of pneumonia    Remote  . History of scarlet fever    Childhood  . Hyperlipidemia   . Hypertension   . Myocardial infarction ([place]) 2009  . Obesity   . Obstructive sleep apnea   . Pain    CHRONIC BACK / ABDOMINAL  . Panic disorder   . Peripheral venous insufficiency   . PTSD (post-traumatic stress disorder)   . Retinopathy    DIABETIC  . Stasis, venous   . Vertigo    Past Surgical History:  Procedure Laterality Date  . CARDIAC CATHETERIZATION    . CATARACT EXTRACTION Left   . CATARACT EXTRACTION W/PHACO Right 05/03/2017   Procedure: CATARACT EXTRACTION PHACO AND INTRAOCULAR LENS PLACEMENT (IOC);  Surgeon: Leandrew Koyanagi, MD;  Location: [facility] ORS;  Service: Ophthalmology;  Laterality: Right;  [place] 00:35.3 AP% 12.3 CDE 4.33 Fluid Pack lot # [id number]       . CORONARY ANGIOPLASTY WITH STENT PLACEMENT  2002  . CORONARY ANGIOPLASTY WITH STENT PLACEMENT  1999  . CORONARY ARTERY BYPASS GRAFT     x7  . ESOPHAGOGASTRODUODENOSCOPY N/A 09/19/2016   Procedure: ESOPHAGOGASTRODUODENOSCOPY (EGD);  Surgeon: Lollie Sails, MD;  Location: [facility] ENDOSCOPY;  Service: Endoscopy;  Laterality: N/A;   Family  History  Problem Relation Age of Onset  . Heart attack Mother   . Hypertension Mother   . Hyperlipidemia Mother   . Heart attack Brother 94       MI  . Coronary artery disease Other    Social History   Socioeconomic History  . Marital status: Single    Spouse name: Not on file  . Number of children: Not on file  . Years of education: Not on file  . Highest education level: Not on file  Occupational History    Comment: disabled  Tobacco Use  . Smoking status: Never Smoker  . Smokeless tobacco: Never Used  Vaping Use  . Vaping Use: Never used  Substance and Sexual Activity  . Alcohol use: No  . Drug use: No  . Sexual activity: Not Currently  Other Topics Concern  . Not on file   Social History Narrative   Disabled   Single   Social Determinants of Health   Financial Resource Strain: Low Risk   . Difficulty of Paying Living Expenses: Not hard at all  Food Insecurity: No Food Insecurity  . Worried About Charity fundraiser in the Last Year: Never true  . Ran Out of Food in the Last Year: Never true  Transportation Needs: Not on file  Physical Activity: Inactive  . Days of Exercise per Week: 0 days  . Minutes of Exercise per Session: 0 min  Stress: No Stress Concern Present  . Feeling of Stress : Not at all  Social Connections: Not on file   Allergies  Allergen Reactions  . Penicillin G Hives  . Sulfa Antibiotics Hives  . Tiotropium   . Zoloft [Sertraline Hcl] Other (See Comments)    Medications   (Not in a hospital admission)    Vitals   Vitals:   11/06/20 0706 11/06/20 0712 11/06/20 0730 11/06/20 0830  BP:  (!) 188/95 (!) 177/104 (!) 146/87  Pulse: 99 (!) 111 (!) 108 (!) 104  Resp: 18 18  17   Temp: 98.5 F (36.9 C) 98.5 F (36.9 C)    TempSrc: Oral Oral    SpO2: 98% 98% 97% 94%  Weight:      Height:         Body mass index is 37.61 kg/m.  Physical Exam   Physical Exam Gen: A&O x4, NAD HEENT: Atraumatic, normocephalic;mucous membranes moist; oropharynx clear, tongue without atrophy or fasciculations. Neck: Supple, trachea midline. Resp: CTAB, no w/r/r CV: RRR, no m/g/r; nml S1 and S2. 2+ symmetric peripheral pulses. Abd: soft/NT/ND; nabs x 4 quad Extrem: Nml bulk; no cyanosis, clubbing, or edema.  Neuro: *MS: A&O x4. Pleasant and delusional. Follows simple commands but becomes easily confused with multi-step. *Speech: fluid, nondysarthric, able to name and repeat *CN:    I: Deferred   II,III: PERRLA, VFF by confrontation, optic discs sharp   III,IV,VI: EOMI w/o nystagmus, no ptosis   V: Sensation intact from V1 to V3 to LT   VII: Eyelid closure was full.  Smile symmetric.   VIII: Hearing intact to voice   IX,X: Voice  normal, palate elevates symmetrically    XI: SCM/trap 5/5 bilat   XII: Tongue protrudes midline, no atrophy or fasciculations   *Motor:   Normal bulk.  No tremor, rigidity or bradykinesia. No pronator drift.    Strength: Dlt Bic Tri WrE WrF FgS Gr HF KnF KnE PlF DoF    Left 5 5 5 5 5 5 5 5 5 5  5  5    Right 5 5 5 5 5 5 5 5 5 5 5 5     *Sensory: Intact to light touch, pinprick, temperature vibration throughout. Symmetric. Propioception intact bilat.  No double-simultaneous extinction.  *Coordination:  Finger-to-nose, heel-to-shin, rapid alternating motions were intact. *Reflexes:  1+ and symmetric throughout without clonus; toes down-going bilat *Gait: deferred  NIHSS = 0 @ 11/06/20 1015   Labs   CBC:  Recent Labs  Lab 11/06/20 0734  WBC 8.4  HGB 14.5  HCT 45.6  MCV 87.0  PLT 604    Basic Metabolic Panel:  Lab Results  Component Value Date   NA 135 11/06/2020   K 4.0 11/06/2020   CO2 21 (L) 11/06/2020   GLUCOSE 165 (H) 11/06/2020   BUN 17 11/06/2020   CREATININE 1.08 11/06/2020   CALCIUM 8.9 11/06/2020   GFRNONAA >60 11/06/2020   GFRAA 105 07/08/2020   Lipid Panel:  Lab Results  Component Value Date   LDLCALC 174 (H) 10/06/2020   HgbA1c:  Lab Results  Component Value Date   HGBA1C 6.4 10/06/2020   Urine Drug Screen: No results found for: LABOPIA, COCAINSCRNUR, LABBENZ, AMPHETMU, THCU, LABBARB  Alcohol Level No results found for: [place]   Impression   65 yo man with hx a fib noncompliant on xarelto and DM2 presents with inconsistent hx full-body paresthesias x1 wk and found to have a subacute R thalamic stroke on MRI brain  Recommendations   - Admit to hospitalist service for stroke w/u - No indication for permissive hypertension >48 hrs from sx onset. Goal normotension; avoid hypotension. - Xarelto 20mg  qhs. OK to continue ASA 81mg  daily if for cardiac indication (hx CAD). For secondary stroke prevention there is no additional benefit of adding antiplatelet  to therapeutic anticoagulation. - CTA or MRA H&N - TTE no bubble - Check A1c, DM mgmt per admitting team - Check LDL. Continue rosuvastatin 40mg  and zetia 10mg  daily for now. Goal LDL <70 in setting of DM - q4 hr neuro checks - STAT head CT for any change in neuro exam - Stroke education - Amb referral to neurology upon discharge  Will continue to follow.     ______________________________________________________________________   Thank you for the opportunity to take part in the care of this patient. If you have any further questions, please contact the neurology consultation attending.  Signed,  Su Monks, MD [facility] [phone]  If 7pm- 7am, please page neurology on call as listed in [place].

## 2020-11-06 NOTE — ED Notes (Signed)
Patient is resting comfortably in bed on his phone

## 2020-11-06 NOTE — ED Notes (Signed)
Assigned as Primary RN at this time. Patient is lying comfortably and in no acute distress. Will continue to monitor and assess.

## 2020-11-06 NOTE — ED Notes (Signed)
Patient transported to MRI 

## 2020-11-06 NOTE — ED Notes (Signed)
Pt state he was diagnosed with covid 2 and half weeks ago and did not have any symptoms.

## 2020-11-06 NOTE — ED Provider Notes (Signed)
[facility] Emergency Department Provider Note   ____________________________________________   Event Date/Time   First MD Initiated Contact with Patient 11/06/20 [id number]     (approximate)  I have reviewed the triage vital signs and the nursing notes.   HISTORY  Chief Complaint Numbness    HPI Evan [name] is a 65 y.o. male history of angina CAD CHF previous MI diabetes  Patient reports that he woke up at about 3 in the morning felt like both of his legs felt numb and heavy.  My feeling is since improved, but he reports he felt heavy for at least a couple of hours.  No chest pain no per breathing no fevers.  He reports his sensation is returned to both legs now.  He is not really sure what happened.  They felt heavy but that is since resolved  He denies weakness was on one side or the other, it was both legs involved.  There is no associated back pain.  No chest pain or trouble breathing.  Reports otherwise in his normal state of health.  He did report he had Covid about 2.5 weeks ago but never got any symptoms   Reports that he is able to walk, EMS reports he walked to the stretcher  Past Medical History:  Diagnosis Date  . Anginal pain ([place])   . Anxiety disorder   . Asthma   . Atresia of esophagus without fistula   . CAD (coronary artery disease)   . Cellulitis   . CHF (congestive heart failure) ([place])    NYHA CLASS III,CHRONIC,DIASTOLIC  . COPD (chronic obstructive pulmonary disease) ([place])   . Diabetes mellitus without complication ([place])   . Edema    RIGHT LOWER LEG  . Gastroesophageal reflux   . H/O: GI bleed   . History of pneumonia    Remote  . History of scarlet fever    Childhood  . Hyperlipidemia   . Hypertension   . Myocardial infarction ([place]) 2009  . Obesity   . Obstructive sleep apnea   . Pain    CHRONIC BACK / ABDOMINAL  . Panic disorder   . Peripheral venous insufficiency   . PTSD (post-traumatic stress disorder)    . Retinopathy    DIABETIC  . Stasis, venous   . Vertigo     Patient Active Problem List   Diagnosis Date Noted  . Acute CVA (cerebrovascular accident) ([place]) 11/06/2020  . Lab test positive for detection of COVID-19 virus 09/27/2020  . Dysphagia 08/31/2020  . Atherosclerosis of aorta ([place]) 12/04/2019  . Persistent atrial fibrillation ([place]) 09/12/2019  . CAD (coronary artery disease) 09/12/2019  . Chronic venous stasis 09/12/2019  . Hoarding disorder 09/12/2019  . Cervical spinal stenosis 09/12/2019  . Diabetic retinopathy ([place]) 09/12/2019  . COPD, mild ([place]) 09/12/2019  . Osteoporosis 09/09/2019  . History of prostate cancer 09/09/2019  . Anxiety 08/10/2019  . Obstructive sleep apnea on CPAP 08/10/2019  . Hyperlipidemia associated with type 2 diabetes mellitus ([place]) 08/10/2019  . Chronic diastolic CHF (congestive heart failure) ([place]) 01/09/2014  . Esophageal dysmotility 09/12/2013  . Type 2 diabetes mellitus with proteinuria ([place]) 02/14/2012  . Obesity, morbid (more than 100 lbs over ideal weight or BMI > 40) ([place]) 07/26/2011  . OLD MYOCARDIAL INFARCTION 03/02/2010  . Hypertension associated with diabetes ([place]) 03/20/2009  . CORONARY ATHEROSCLEROSIS, ARTERY BYPASS GRAFT 03/20/2009    Past Surgical History:  Procedure Laterality Date  . CARDIAC CATHETERIZATION    .  CATARACT EXTRACTION Left   . CATARACT EXTRACTION W/PHACO Right 05/03/2017   Procedure: CATARACT EXTRACTION PHACO AND INTRAOCULAR LENS PLACEMENT (IOC);  Surgeon: Leandrew Koyanagi, MD;  Location: [facility] ORS;  Service: Ophthalmology;  Laterality: Right;  [place] 00:35.3 AP% 12.3 CDE 4.33 Fluid Pack lot # [id number]       . CORONARY ANGIOPLASTY WITH STENT PLACEMENT  2002  . CORONARY ANGIOPLASTY WITH STENT PLACEMENT  1999  . CORONARY ARTERY BYPASS GRAFT     x7  . ESOPHAGOGASTRODUODENOSCOPY N/A 09/19/2016   Procedure: ESOPHAGOGASTRODUODENOSCOPY (EGD);  Surgeon: Lollie Sails, MD;  Location: [facility] ENDOSCOPY;   Service: Endoscopy;  Laterality: N/A;    Prior to Admission medications   Medication Sig Start Date End Date Taking? Authorizing Provider  acetaminophen (TYLENOL) 500 MG tablet Take 500 mg by mouth every 6 (six) hours as needed.    [provider]  aspirin 81 MG chewable tablet Chew 81 mg by mouth daily.    [provider]  Blood Glucose Monitoring Suppl (ONETOUCH VERIO) w/Device KIT Use to check blood sugar 3 to 4 times a day and document.  Please bring to visits for review. 07/08/20   [name], Henrine Screws T, NP  budesonide-formoterol (SYMBICORT) 80-4.5 MCG/ACT inhaler Inhale 2 puffs into the lungs 2 (two) times daily. 09/24/20   [name], Henrine Screws T, NP  cholecalciferol (VITAMIN D3) 25 MCG (1000 UNIT) tablet Take 1 tablet (1,000 Units total) by mouth daily. 07/08/20   [name], NP  Elastic Bandages & Supports (MEDICAL COMPRESSION STOCKINGS) MISC 1 Package by Does not apply route daily. 08/28/20   Sable Feil, PA-C  Emollient (EUCERIN) lotion Apply topically as needed for dry skin. Apply to legs and arms daily to help with dry skin. Patient not taking: No sig reported 04/30/20   [name], Henrine Screws T, NP  empagliflozin (JARDIANCE) 10 MG TABS tablet Take 1 tablet (10 mg total) by mouth daily. 07/08/20   [name], Henrine Screws T, NP  ezetimibe (ZETIA) 10 MG tablet Take 1 tablet (10 mg total) by mouth daily. 10/07/20   [name], Henrine Screws T, NP  fluticasone (FLONASE) 50 MCG/ACT nasal spray Place 2 sprays into both nostrils daily.  Patient not taking: No sig reported 12/31/13   [provider]  furosemide (LASIX) 40 MG tablet TAKE 2 TABLETS BY MOUTH ONCE EVERY MORNING AND 1 TABLET ONCE EVERY EVENING 05/18/20   [name], [name] T, NP  glucose blood (ONETOUCH VERIO) test strip Use to check blood sugar 3 to 4 times a day. 07/08/20   [name], Henrine Screws T, NP  hydrOXYzine (ATARAX/VISTARIL) 25 MG tablet Take 1 tablet (25 mg total) by mouth 3 (three) times daily as needed (dizziness or anxiety). Patient  not taking: No sig reported 08/01/20   Carrie Mew, MD  lisinopril (ZESTRIL) 10 MG tablet Take 1 tablet (10 mg total) by mouth daily. 07/08/20   [name], Henrine Screws T, NP  metoprolol succinate (TOPROL XL) 50 MG 24 hr tablet Take 1 tablet (50 mg total) by mouth daily. 07/08/20   [name], Henrine Screws T, NP  nitroGLYCERIN (NITROSTAT) 0.4 MG SL tablet Place 1 tablet (0.4 mg total) under the tongue every 5 (five) minutes as needed for chest pain. 10/10/19   [name], Henrine Screws T, NP  Omega-3 Fatty Acids (OMEGA 3 500 PO) Take 500 mg by mouth daily.    [provider]  OneTouch Delica Lancets 47S MISC Use to check blood sugar 2-3 times a day. 09/29/20   [name], Henrine Screws T, NP  pantoprazole (PROTONIX) 40 MG tablet  Take 1 tablet (40 mg total) by mouth daily. 04/08/20   [name], Henrine Screws T, NP  risperiDONE (RISPERDAL) 0.5 MG tablet Take 1 tablet (0.5 mg total) by mouth 2 (two) times daily. 07/08/20   [name], Henrine Screws T, NP  rivaroxaban (XARELTO) 20 MG TABS tablet Take 1 tablet (20 mg total) by mouth at bedtime. 07/08/20   [name], Henrine Screws T, NP  rosuvastatin (CRESTOR) 40 MG tablet Take 1 tablet (40 mg total) by mouth daily. 05/06/20   [name] T, NP  sharps container 1 each by Does not apply route as needed. 09/29/20   [name], NP  Skin Protectants, Misc. (EUCERIN) cream Apply topically as needed for dry skin. 08/28/20   Sable Feil, PA-C  triamcinolone (KENALOG) 0.1 % APPLY TO AFFECTED AREA(s) TWICE DAILY 10/18/20   [name] T, NP    Allergies Penicillin g, Sulfa antibiotics, Tiotropium, and Zoloft [sertraline hcl]  Family History  Problem Relation Age of Onset  . Heart attack Mother   . Hypertension Mother   . Hyperlipidemia Mother   . Heart attack Brother 38       MI  . Coronary artery disease Other     Social History Social History   Tobacco Use  . Smoking status: Never Smoker  . Smokeless tobacco: Never Used  Vaping Use  . Vaping Use: Never used  Substance Use Topics  .  Alcohol use: No  . Drug use: No    Review of Systems Constitutional: No fever/chills Eyes: No visual changes.  No weakness in the face. ENT: No sore throat. Cardiovascular: Denies chest pain. Respiratory: Denies shortness of breath. Gastrointestinal: No abdominal pain.   Genitourinary: Negative for dysuria. Musculoskeletal: Negative for back pain.  No trouble using his arms.  No numbness aside from about his waist down does not resolve Skin: Negative for rash. Neurological: Negative for headaches, areas of focal weakness or numbness.    ____________________________________________   PHYSICAL EXAM:  VITAL SIGNS: ED Triage Vitals  Enc Vitals Group     BP 11/06/20 0712 (!) 188/95     Pulse Rate 11/06/20 0706 99     Resp 11/06/20 0706 18     Temp 11/06/20 0706 98.5 F (36.9 C)     Temp Source 11/06/20 0706 Oral     SpO2 11/06/20 0706 98 %     Weight 11/06/20 0704 226 lb (102.5 kg)     Height 11/06/20 0704 5' 5"  (1.651 m)     Head Circumference --      Peak Flow --      Pain Score 11/06/20 0703 7     Pain Loc --      Pain Edu? --      Excl. in [place]? --     Constitutional: Alert and oriented. Well appearing and in no acute distress.  He is somewhat verbose.  A little bit of grandeur in his speech.  Reports that he is in close contact with all of his friends on Facebook and is considering live streaming. Eyes: Conjunctivae are normal. Head: Atraumatic. Nose: No congestion/rhinnorhea. Mouth/Throat: Mucous membranes are moist. Neck: No stridor.  Cardiovascular: Normal rate, regular rhythm. Grossly normal heart sounds.  Good peripheral circulation. Respiratory: Normal respiratory effort.  No retractions. Lungs CTAB. Gastrointestinal: Soft and nontender. No distention. Musculoskeletal: No lower extremity tenderness nor edema.  Patient demonstrates 5 out of 5 strength in his lower extremities upper extremities bilateral. Neurologic:  Normal speech and language. No gross focal  neurologic  deficits are appreciated.  He moves all extremities to command.  He denies any loss of sensation in any dermatomal or particular region now.  His cranial nerve exam is normal.  Extraocular movements are normal.  5 out of 5 strength in all extremities.  Strong dorsalis pedis pulses and normal distal capillary refill in the feet bilateral. Skin:  Skin is warm, dry and intact. No rash noted. Psychiatric: Mood and affect are normal somewhat verbose and a little bit of elevated behavior with regard to feeling very verbose and grandiose. Speech and behavior are otherwise normal.  Denies hallucinations.  ____________________________________________   LABS (all labs ordered are listed, but only abnormal results are displayed)  Labs Reviewed  CBC - Abnormal; Notable for the following components:      Result Value   RDW 16.0 (*)    All other components within normal limits  BASIC METABOLIC PANEL - Abnormal; Notable for the following components:   CO2 21 (*)    Glucose, Bld 165 (*)    All other components within normal limits  CBG MONITORING, ED - Abnormal; Notable for the following components:   Glucose-Capillary 180 (*)    All other components within normal limits  RESP PANEL BY RT-PCR (FLU A&B, COVID) ARPGX2  MAGNESIUM  TSH  VITAMIN B12  URINE DRUG SCREEN, QUALITATIVE ([facility] ONLY)  URINALYSIS, ROUTINE W REFLEX MICROSCOPIC  PROTIME-INR  APTT   ____________________________________________  EKG  Reviewed inter by me at 745 Heart rate 99 QRS 129 QTc 479 A. fib, rate controlled.  Of note the patient has a history of A. fib and he reports compliance with his medications including Xarelto.  He has not had his blood pressure medicines this morning ____________________________________________  RADIOLOGY  MR BRAIN WO CONTRAST  Result Date: 11/06/2020 CLINICAL DATA:  Numbness or tingling.  Paresthesias. EXAM: MRI HEAD WITHOUT CONTRAST TECHNIQUE: Multiplanar, multiecho pulse sequences  of the brain and surrounding structures were obtained without intravenous contrast. COMPARISON:  CT head 03/01/2020. FINDINGS: Brain: An acute/subacute nonhemorrhagic right thalamic infarct measures up to 9 mm. T2 seek scratched at T2 hyperintensity associated. Periventricular and subcortical T2 hyperintensities bilaterally are otherwise moderately advanced for age. Mild generalized atrophy is noted. Internal auditory canals are within normal limits. A remote lacunar infarct is present the posterior right cerebellum. Brainstem and cerebellum are otherwise unremarkable. Vascular: Flow is present in the major intracranial arteries. Skull and upper cervical spine: Prominent soft tissue is present at C1-2 anterolisthesis is present at C1-2 with crowding of the spinal canal. No abnormal cord signal is present on the coronal T2 weighted images. Midline structures are otherwise within normal limits. Marrow signal is normal. Sinuses/Orbits: Anterior ethmoid air cells are partially opacified on the right. The right sinus is opacified. The paranasal sinuses and mastoid air cells are otherwise clear. Bilateral lens replacements are noted. Globes and orbits are otherwise unremarkable. IMPRESSION: 1. Acute/subacute nonhemorrhagic 9 mm right thalamic infarct. 2. Mild generalized atrophy and moderate white matter disease is advanced for age. This likely reflects the sequela of chronic microvascular ischemia. 3. Right ethmoid and frontal sinus disease. 4. Prominent soft tissue at C1-2 with crowding of the spinal canal. No abnormal cord signal is present. These results were called by telephone at the time of interpretation on 11/06/2020 at 10:05 am to provider Zita Ozimek , who verbally acknowledged these results. Electronically Signed   By: San Morelle M.D.   On: 11/06/2020 10:05   MR LUMBAR SPINE WO CONTRAST  Result Date:  11/06/2020 CLINICAL DATA:  Numbness or tingling with paresthesia. EXAM: MRI LUMBAR SPINE WITHOUT  CONTRAST TECHNIQUE: Multiplanar, multisequence MR imaging of the lumbar spine was performed. No intravenous contrast was administered. COMPARISON:  None. FINDINGS: Segmentation: 5 lumbar type vertebrae based on the available coverage Alignment:  Mild anterolisthesis at L4-5 Vertebrae: Dense fatty marrow at L5 and the sacrum. The appearance usually relates to prior radiotherapy and likely relates to history of prostate cancer. No fracture, discitis, or aggressive bone lesion. Conus medullaris and cauda equina: Conus extends to the L1 level. Conus and cauda equina appear normal. Paraspinal and other soft tissues: No acute finding Disc levels: Degenerative changes are superimposed on a congenitally narrow spinal canal from short pedicles T12- L1: Ventral spondylitic spurring L1-L2: Disc narrowing and bulging with central protrusion. Mild thickening of the ligamentum flavum. Spinal stenosis appears advanced on sagittal images but more moderate on axial slices. Patent foramina L2-L3: Disc narrowing and mild bulging. L3-L4: Disc narrowing and bulging. Facet spurring and ligamentum flavum thickening. Moderate spinal stenosis. L4-L5: Disc narrowing and bulging. Facet osteoarthritis with spurring and mild anterolisthesis. Moderate left foraminal narrowing with facet spur touching the L4 nerve root. L5-S1:Minor facet spurring IMPRESSION: 1. No acute finding. 2. Lumbar spine degeneration which is exacerbated by short pedicles. 3. Spinal stenosis that is moderate to advanced at L1-2 and moderate at L3-4. 4. Up to moderate foraminal narrowing on the left at L5-S1. Electronically Signed   By: Monte Fantasia M.D.   On: 11/06/2020 10:03    Imaging reviewed, discussed with Dr. Jobe Igo.  MRI of the brain showing an acute nonhemorrhagic stroke, also has some soft tissue around C1 and C2 with crowding of the spinal canal. ____________________________________________   PROCEDURES  Procedure(s) performed:  None  Procedures  Critical Care performed: Yes, see critical care note(s)  CRITICAL CARE Performed by: Delman Kitten   Total critical care time: 25 minutes  Critical care time was exclusive of separately billable procedures and treating other patients.  Critical care was necessary to treat or prevent imminent or life-threatening deterioration.  Critical care was time spent personally by me on the following activities: development of treatment plan with patient and/or surrogate as well as nursing, discussions with consultants, evaluation of patient's response to treatment, examination of patient, obtaining history from patient or surrogate, ordering and performing treatments and interventions, ordering and review of laboratory studies, ordering and review of radiographic studies, pulse oximetry and re-evaluation of patient's condition.  ____________________________________________   INITIAL IMPRESSION / ASSESSMENT AND PLAN / ED COURSE  Pertinent labs & imaging results that were available during my care of the patient were reviewed by me and considered in my medical decision making (see chart for details).   Patient comes for evaluation of what it seems to be a resolved paresthesia and weakness in his bilateral lower extremities.  Does not appear to be unilateral and I suspect unlikely to represent stroke, but he does also have a complex medical history including that of A. fib.  The fact that his symptoms have resolved is very reassuring, but given this complaint and no known similar complaint from the patient as well as combined with being on patient with A. fib on Xarelto we will proceed by obtaining MRI of the brain as well as lumbar spine to evaluate for central neurologic etiology.  He is not have any sort of ascending neurologic disorder.  His symptoms seem to be self resolving, no evidence of Guillain-Barr.  Normal sensation now as well  as examination.  I suspect if his MRI and lab  work is reassuring he will be able to be discharged to follow-up with his primary  Clinical Course as of 11/06/20 1029  Sat Nov 06, 2020  1010 Outside TPA window. Awoke about 3AM with symptoms. No evidence LVO by VAN scale  Discussed with Dr. Quinn Axe, neuro hospitalist, reviewed imaging and advises bike review of MRI that this would not be a large vessel occlusion.  Recommends admission to the hospitalist service, salicylate therapy.  Will come and see and evaluate patient in the ER this morning. [MQ]    Clinical Course User Index [MQ] Delman Kitten, MD   Patient is not a TPA candidate, well outside of TPA window at this point and is also anticoagulated reporting compliance with Xarelto therapy.  Seen evaluated by Dr. Quinn Axe, advises admission.  Admission discussed and plan by Dr. Francine Graven  ____________________________________________   FINAL CLINICAL IMPRESSION(S) / ED DIAGNOSES  Final diagnoses:  Acute stroke due to ischemia [facility])        Note:  This document was prepared using Dragon voice recognition software and may include unintentional dictation errors       Delman Kitten, MD 11/06/20 1030

## 2020-11-06 NOTE — H&P (Signed)
History and Physical    Evan [name] UUV:[id number] DOB: 20-Mar-1956 DOA: 11/06/2020  PCP: [name], NP   Patient coming from: Home  I have personally briefly reviewed patient's old medical records in [facility]  Chief Complaint: Lower extremity numbness Patient is a poor historian HPI: Evan [name] is a 65 y.o. male with medical history significant for morbid obesity, coronary artery disease status post PCI/status post CABG, chronic diastolic dysfunction CHF, obstructive sleep apnea, A. fib on anticoagulation, recent COVID 19 infection (09/23/20) and diabetes mellitus who presents to the ER via EMS for evaluation of bilateral lower extremity numbness.  Per EMS patient was able to walk to the stretcher prior to being transported to the ER. Per patient he woke up at about 3 AM on the morning of his admission and felt that both legs were numb and heavy, the sensation has improved since onset at about 3 AM.  He complains of feeling dizzy and lightheaded and also complains of a headache which he has had for about a week. He has no difficulty swallowing, no blurred vision, no slurred speech. He denies having any chest pain, no shortness of breath, no nausea, no vomiting, no abdominal pain, no changes in his bowel habits, no urinary symptoms, no fever, no chills, no cough, no palpitations, no diaphoresis. Labs show sodium 135, potassium 4.0, chloride 105, bicarb 21, glucose 165, BUN 17, creatinine 1.08, calcium 8.9, magnesium 2.3, white count 8.4, hemoglobin 14.5, hematocrit 45.6, MCV 87, RDW 16.0, platelet count 259, PT 13.0, INR 1.0, hemoglobin A1c 6.4 (03/22), TSH 2.5 MRI of the brain without contrast shows acute/subacute nonhemorrhagic 9 mm right thalamic infarct. Mild generalized atrophy and moderate white matter disease is advanced for age. This likely reflects the sequela of chronic microvascular ischemia. Right ethmoid and frontal sinus disease. Prominent soft tissue at C1-2  with crowding of the spinal canal. No abnormal cord signal is present. MRI of the lumbar spine shows no acute finding. Lumbar spine degeneration which is exacerbated by short pedicles. Spinal stenosis that is moderate to advanced at L1-2 and moderate at L3-4. Up to moderate foraminal narrowing on the left at L5-S1. Twelve-lead EKG reviewed by me shows atrial fibrillation with nonspecific T wave changes.    ED Course: Patient is a 65 year old Caucasian male who presents to the ER via EMS for evaluation of bilateral lower extremity numbness.  He has a history of atrial fibrillation and has been noncompliant with his anticoagulation therapy because according to the patient it makes him nauseous and he is unable to keep the pill down.  MRI of the brain shows an acute/subacute nonhemorrhagic 9 mm thalamic infarct.  Patient will be referred to observation status for further evaluation.  Review of Systems: As per HPI otherwise all other systems reviewed and negative.    Past Medical History:  Diagnosis Date  . Anginal pain ([place])   . Anxiety disorder   . Asthma   . Atresia of esophagus without fistula   . CAD (coronary artery disease)   . Cellulitis   . CHF (congestive heart failure) ([place])    NYHA CLASS III,CHRONIC,DIASTOLIC  . COPD (chronic obstructive pulmonary disease) ([place])   . Diabetes mellitus without complication ([place])   . Edema    RIGHT LOWER LEG  . Gastroesophageal reflux   . H/O: GI bleed   . History of pneumonia    Remote  . History of scarlet fever    Childhood  . Hyperlipidemia   .  Hypertension   . Myocardial infarction ([place]) 2009  . Obesity   . Obstructive sleep apnea   . Pain    CHRONIC BACK / ABDOMINAL  . Panic disorder   . Peripheral venous insufficiency   . PTSD (post-traumatic stress disorder)   . Retinopathy    DIABETIC  . Stasis, venous   . Vertigo     Past Surgical History:  Procedure Laterality Date  . CARDIAC CATHETERIZATION    . CATARACT EXTRACTION  Left   . CATARACT EXTRACTION W/PHACO Right 05/03/2017   Procedure: CATARACT EXTRACTION PHACO AND INTRAOCULAR LENS PLACEMENT (IOC);  Surgeon: Leandrew Koyanagi, MD;  Location: [facility] ORS;  Service: Ophthalmology;  Laterality: Right;  [place] 00:35.3 AP% 12.3 CDE 4.33 Fluid Pack lot # [id number]       . CORONARY ANGIOPLASTY WITH STENT PLACEMENT  2002  . CORONARY ANGIOPLASTY WITH STENT PLACEMENT  1999  . CORONARY ARTERY BYPASS GRAFT     x7  . ESOPHAGOGASTRODUODENOSCOPY N/A 09/19/2016   Procedure: ESOPHAGOGASTRODUODENOSCOPY (EGD);  Surgeon: Lollie Sails, MD;  Location: [facility] ENDOSCOPY;  Service: Endoscopy;  Laterality: N/A;     reports that he has never smoked. He has never used smokeless tobacco. He reports that he does not drink alcohol and does not use drugs.  Allergies  Allergen Reactions  . Penicillin G Hives  . Sulfa Antibiotics Hives  . Tiotropium   . Zoloft [Sertraline Hcl] Other (See Comments)    Family History  Problem Relation Age of Onset  . Heart attack Mother   . Hypertension Mother   . Hyperlipidemia Mother   . Heart attack Brother 103       MI  . Coronary artery disease Other       Prior to Admission medications   Medication Sig Start Date End Date Taking? Authorizing Provider  acetaminophen (TYLENOL) 500 MG tablet Take 500 mg by mouth every 6 (six) hours as needed.    [provider]  aspirin 81 MG chewable tablet Chew 81 mg by mouth daily.    [provider]  Blood Glucose Monitoring Suppl (ONETOUCH VERIO) w/Device KIT Use to check blood sugar 3 to 4 times a day and document.  Please bring to visits for review. 07/08/20   [name], Henrine Screws T, NP  budesonide-formoterol (SYMBICORT) 80-4.5 MCG/ACT inhaler Inhale 2 puffs into the lungs 2 (two) times daily. 09/24/20   [name], Henrine Screws T, NP  cholecalciferol (VITAMIN D3) 25 MCG (1000 UNIT) tablet Take 1 tablet (1,000 Units total) by mouth daily. 07/08/20   [name], NP  Elastic Bandages &  Supports (MEDICAL COMPRESSION STOCKINGS) MISC 1 Package by Does not apply route daily. 08/28/20   Sable Feil, PA-C  Emollient (EUCERIN) lotion Apply topically as needed for dry skin. Apply to legs and arms daily to help with dry skin. Patient not taking: No sig reported 04/30/20   [name], Henrine Screws T, NP  empagliflozin (JARDIANCE) 10 MG TABS tablet Take 1 tablet (10 mg total) by mouth daily. 07/08/20   [name], Henrine Screws T, NP  ezetimibe (ZETIA) 10 MG tablet Take 1 tablet (10 mg total) by mouth daily. 10/07/20   [name], Henrine Screws T, NP  fluticasone (FLONASE) 50 MCG/ACT nasal spray Place 2 sprays into both nostrils daily.  Patient not taking: No sig reported 12/31/13   [provider]  furosemide (LASIX) 40 MG tablet TAKE 2 TABLETS BY MOUTH ONCE EVERY MORNING AND 1 TABLET ONCE EVERY EVENING 05/18/20   [name], [name] T, NP  glucose blood (  ONETOUCH VERIO) test strip Use to check blood sugar 3 to 4 times a day. 07/08/20   [name], Henrine Screws T, NP  hydrOXYzine (ATARAX/VISTARIL) 25 MG tablet Take 1 tablet (25 mg total) by mouth 3 (three) times daily as needed (dizziness or anxiety). Patient not taking: No sig reported 08/01/20   Carrie Mew, MD  lisinopril (ZESTRIL) 10 MG tablet Take 1 tablet (10 mg total) by mouth daily. 07/08/20   [name], Henrine Screws T, NP  metoprolol succinate (TOPROL XL) 50 MG 24 hr tablet Take 1 tablet (50 mg total) by mouth daily. 07/08/20   [name], Henrine Screws T, NP  nitroGLYCERIN (NITROSTAT) 0.4 MG SL tablet Place 1 tablet (0.4 mg total) under the tongue every 5 (five) minutes as needed for chest pain. 10/10/19   [name], Henrine Screws T, NP  Omega-3 Fatty Acids (OMEGA 3 500 PO) Take 500 mg by mouth daily.    [provider]  OneTouch Delica Lancets 66Q MISC Use to check blood sugar 2-3 times a day. 09/29/20   [name], Henrine Screws T, NP  pantoprazole (PROTONIX) 40 MG tablet Take 1 tablet (40 mg total) by mouth daily. 04/08/20   [name], Henrine Screws T, NP  risperiDONE (RISPERDAL) 0.5 MG tablet Take 1  tablet (0.5 mg total) by mouth 2 (two) times daily. 07/08/20   [name], Henrine Screws T, NP  rivaroxaban (XARELTO) 20 MG TABS tablet Take 1 tablet (20 mg total) by mouth at bedtime. 07/08/20   [name], Henrine Screws T, NP  rosuvastatin (CRESTOR) 40 MG tablet Take 1 tablet (40 mg total) by mouth daily. 05/06/20   [name] T, NP  sharps container 1 each by Does not apply route as needed. 09/29/20   [name], NP  Skin Protectants, Misc. (EUCERIN) cream Apply topically as needed for dry skin. 08/28/20   Sable Feil, PA-C  triamcinolone (KENALOG) 0.1 % APPLY TO AFFECTED AREA(s) TWICE DAILY 10/18/20   [name] T, NP    Physical Exam: Vitals:   11/06/20 0712 11/06/20 0730 11/06/20 0830 11/06/20 1100  BP: (!) 188/95 (!) 177/104 (!) 146/87 130/79  Pulse: (!) 111 (!) 108 (!) 104 84  Resp: 18  17 (!) 22  Temp: 98.5 F (36.9 C)     TempSrc: Oral     SpO2: 98% 97% 94% 96%  Weight:      Height:         Vitals:   11/06/20 0712 11/06/20 0730 11/06/20 0830 11/06/20 1100  BP: (!) 188/95 (!) 177/104 (!) 146/87 130/79  Pulse: (!) 111 (!) 108 (!) 104 84  Resp: 18  17 (!) 22  Temp: 98.5 F (36.9 C)     TempSrc: Oral     SpO2: 98% 97% 94% 96%  Weight:      Height:          Constitutional: Alert and oriented x 3 . Not in any apparent distress.  Obese HEENT:      Head: Normocephalic and atraumatic.         Eyes: PERLA, EOMI, Conjunctivae are normal. Sclera is non-icteric.       Mouth/Throat: Mucous membranes are moist.       Neck: Supple with no signs of meningismus. Cardiovascular:  Irregularly irregular. No murmurs, gallops, or rubs. 2+ symmetrical distal pulses are present . No JVD. No LE edema Respiratory: Respiratory effort normal .Lungs sounds clear bilaterally. No wheezes, crackles, or rhonchi.  Gastrointestinal: Soft, epigastric tenderness, and non distended with positive bowel sounds.  Central adiposity Genitourinary: No CVA tenderness.  Musculoskeletal: Nontender with normal  range of motion in all extremities. No cyanosis, or erythema of extremities. Neurologic:  Face is symmetric. Moving all extremities. No gross focal neurologic deficits . Skin: Skin is warm, dry.  No rash or ulcers Psychiatric: Mood and affect are normal   Labs on Admission: I have personally reviewed following labs and imaging studies  CBC: Recent Labs  Lab 11/06/20 0734  WBC 8.4  HGB 14.5  HCT 45.6  MCV 87.0  PLT 677   Basic Metabolic Panel: Recent Labs  Lab 11/06/20 0734  NA 135  K 4.0  CL 105  CO2 21*  GLUCOSE 165*  BUN 17  CREATININE 1.08  CALCIUM 8.9  MG 2.3   GFR: Estimated Creatinine Clearance: 74.1 mL/min (by C-G formula based on SCr of 1.08 mg/dL). Liver Function Tests: No results for input(s): AST, ALT, ALKPHOS, BILITOT, PROT, ALBUMIN in the last 168 hours. No results for input(s): LIPASE, AMYLASE in the last 168 hours. No results for input(s): AMMONIA in the last 168 hours. Coagulation Profile: Recent Labs  Lab 11/06/20 1021  INR 1.0   Cardiac Enzymes: No results for input(s): CKTOTAL, CKMB, CKMBINDEX, TROPONINI in the last 168 hours. BNP (last 3 results) No results for input(s): PROBNP in the last 8760 hours. HbA1C: No results for input(s): HGBA1C in the last 72 hours. CBG: Recent Labs  Lab 11/06/20 0747  GLUCAP 180*   Lipid Profile: No results for input(s): CHOL, HDL, LDLCALC, TRIG, CHOLHDL, LDLDIRECT in the last 72 hours. Thyroid Function Tests: Recent Labs    11/06/20 0734  TSH 2.567   Anemia Panel: No results for input(s): VITAMINB12, FOLATE, FERRITIN, TIBC, IRON, RETICCTPCT in the last 72 hours. Urine analysis:    Component Value Date/Time   COLORURINE YELLOW (A) 11/06/2020 1021   APPEARANCEUR CLEAR (A) 11/06/2020 1021   APPEARANCEUR Clear 08/30/2017 1404   LABSPEC 1.019 11/06/2020 1021   LABSPEC 1.013 11/29/2014 1120   PHURINE 6.0 11/06/2020 1021   GLUCOSEU NEGATIVE 11/06/2020 1021   GLUCOSEU Negative 11/29/2014 1120    HGBUR NEGATIVE 11/06/2020 1021   BILIRUBINUR NEGATIVE 11/06/2020 1021   BILIRUBINUR Negative 08/30/2017 1404   BILIRUBINUR Negative 11/29/2014 1120   KETONESUR NEGATIVE 11/06/2020 1021   PROTEINUR NEGATIVE 11/06/2020 1021   NITRITE NEGATIVE 11/06/2020 1021   LEUKOCYTESUR NEGATIVE 11/06/2020 1021   LEUKOCYTESUR Negative 11/29/2014 1120    Radiological Exams on Admission: MR BRAIN WO CONTRAST  Result Date: 11/06/2020 CLINICAL DATA:  Numbness or tingling.  Paresthesias. EXAM: MRI HEAD WITHOUT CONTRAST TECHNIQUE: Multiplanar, multiecho pulse sequences of the brain and surrounding structures were obtained without intravenous contrast. COMPARISON:  CT head 03/01/2020. FINDINGS: Brain: An acute/subacute nonhemorrhagic right thalamic infarct measures up to 9 mm. T2 seek scratched at T2 hyperintensity associated. Periventricular and subcortical T2 hyperintensities bilaterally are otherwise moderately advanced for age. Mild generalized atrophy is noted. Internal auditory canals are within normal limits. A remote lacunar infarct is present the posterior right cerebellum. Brainstem and cerebellum are otherwise unremarkable. Vascular: Flow is present in the major intracranial arteries. Skull and upper cervical spine: Prominent soft tissue is present at C1-2 anterolisthesis is present at C1-2 with crowding of the spinal canal. No abnormal cord signal is present on the coronal T2 weighted images. Midline structures are otherwise within normal limits. Marrow signal is normal. Sinuses/Orbits: Anterior ethmoid air cells are partially opacified on the right. The right sinus is opacified. The paranasal sinuses and mastoid air cells are otherwise clear. Bilateral lens replacements are noted.  Globes and orbits are otherwise unremarkable. IMPRESSION: 1. Acute/subacute nonhemorrhagic 9 mm right thalamic infarct. 2. Mild generalized atrophy and moderate white matter disease is advanced for age. This likely reflects the sequela  of chronic microvascular ischemia. 3. Right ethmoid and frontal sinus disease. 4. Prominent soft tissue at C1-2 with crowding of the spinal canal. No abnormal cord signal is present. These results were called by telephone at the time of interpretation on 11/06/2020 at 10:05 am to provider MARK QUALE , who verbally acknowledged these results. Electronically Signed   By: San Morelle M.D.   On: 11/06/2020 10:05   MR LUMBAR SPINE WO CONTRAST  Result Date: 11/06/2020 CLINICAL DATA:  Numbness or tingling with paresthesia. EXAM: MRI LUMBAR SPINE WITHOUT CONTRAST TECHNIQUE: Multiplanar, multisequence MR imaging of the lumbar spine was performed. No intravenous contrast was administered. COMPARISON:  None. FINDINGS: Segmentation: 5 lumbar type vertebrae based on the available coverage Alignment:  Mild anterolisthesis at L4-5 Vertebrae: Dense fatty marrow at L5 and the sacrum. The appearance usually relates to prior radiotherapy and likely relates to history of prostate cancer. No fracture, discitis, or aggressive bone lesion. Conus medullaris and cauda equina: Conus extends to the L1 level. Conus and cauda equina appear normal. Paraspinal and other soft tissues: No acute finding Disc levels: Degenerative changes are superimposed on a congenitally narrow spinal canal from short pedicles T12- L1: Ventral spondylitic spurring L1-L2: Disc narrowing and bulging with central protrusion. Mild thickening of the ligamentum flavum. Spinal stenosis appears advanced on sagittal images but more moderate on axial slices. Patent foramina L2-L3: Disc narrowing and mild bulging. L3-L4: Disc narrowing and bulging. Facet spurring and ligamentum flavum thickening. Moderate spinal stenosis. L4-L5: Disc narrowing and bulging. Facet osteoarthritis with spurring and mild anterolisthesis. Moderate left foraminal narrowing with facet spur touching the L4 nerve root. L5-S1:Minor facet spurring IMPRESSION: 1. No acute finding. 2. Lumbar spine  degeneration which is exacerbated by short pedicles. 3. Spinal stenosis that is moderate to advanced at L1-2 and moderate at L3-4. 4. Up to moderate foraminal narrowing on the left at L5-S1. Electronically Signed   By: Monte Fantasia M.D.   On: 11/06/2020 10:03     Assessment/Plan Principal Problem:   Acute CVA (cerebrovascular accident) [facility]) Active Problems:   Essential hypertension   Chronic diastolic CHF (congestive heart failure) (HCC)   Type 2 diabetes mellitus without complication (HCC)   Obstructive sleep apnea on CPAP   Persistent atrial fibrillation (HCC)   CAD (coronary artery disease)    Acute CVA In a patient with known atrial fibrillation noncompliant with chronic anticoagulation therapy He presented for evaluation of bilateral lower extremity numbness and had an MRI of the brain that confirmed an acute/subacute nonhemorrhagic 9 mm right thalamic infarct. Patient received aspirin in the emergency room and was not a candidate for TPA since he was outside the window upon presentation Continue Xarelto, aspirin and statins Allow for permissive hypertension Obtain 2D echocardiogram to assess LVEF and rule out cardiac thrombus Obtain CT angiogram of the neck We will request PT/OT/ST consult Consult neurology    Type 2 diabetes mellitus Hold oral hypoglycemic agents Patient will remain n.p.o. until after evaluated by speech therapy    Persistent atrial fibrillation Rate controlled on metoprolol Continue Xarelto secondary prophylaxis for an acute stroke    History of chronic diastolic dysfunction CHF Stable and not acutely exacerbated Hold Lasix, metoprolol and lisinopril    Obstructive sleep apnea Continue CPAP at bedtime    Obesity (BMI 37.61  kg/m2) Complicates overall prognosis and care     DVT prophylaxis: Xarelto Code Status: full code Family Communication: Greater than 50% of time was spent discussing patient's condition and plan of care with  him at the bedside.  All questions and concerns have been addressed.  He verbalizes understanding and agrees with the plan.  He lists Bruce Donath as his healthcare power of attorney Disposition Plan: Back to previous home environment Consults called: Neurology Status: Observation    Patriciann Becht MD [facility]     11/06/2020, 11:22 AM

## 2020-11-06 NOTE — ED Notes (Signed)
Informed RN bed assigned 1110

## 2020-11-06 NOTE — Progress Notes (Signed)
Chart reviewed. No speech language or swallowing difficulties noted. Speech clear and appropriate. No ST needs at this time. Please reconsult if any further concerns.

## 2020-11-07 DIAGNOSIS — I5032 Chronic diastolic (congestive) heart failure: Secondary | ICD-10-CM

## 2020-11-07 DIAGNOSIS — E1169 Type 2 diabetes mellitus with other specified complication: Secondary | ICD-10-CM

## 2020-11-07 DIAGNOSIS — I4819 Other persistent atrial fibrillation: Secondary | ICD-10-CM | POA: Diagnosis not present

## 2020-11-07 DIAGNOSIS — F22 Delusional disorders: Secondary | ICD-10-CM | POA: Diagnosis not present

## 2020-11-07 DIAGNOSIS — I10 Essential (primary) hypertension: Secondary | ICD-10-CM

## 2020-11-07 DIAGNOSIS — G4733 Obstructive sleep apnea (adult) (pediatric): Secondary | ICD-10-CM | POA: Diagnosis not present

## 2020-11-07 DIAGNOSIS — E785 Hyperlipidemia, unspecified: Secondary | ICD-10-CM | POA: Diagnosis not present

## 2020-11-07 DIAGNOSIS — Z9989 Dependence on other enabling machines and devices: Secondary | ICD-10-CM | POA: Diagnosis not present

## 2020-11-07 DIAGNOSIS — I639 Cerebral infarction, unspecified: Secondary | ICD-10-CM | POA: Diagnosis not present

## 2020-11-07 LAB — ECHOCARDIOGRAM COMPLETE
AR max vel: 0.91 cm2
AV Area VTI: 0.77 cm2
AV Area mean vel: 0.81 cm2
AV Mean grad: 21.5 mmHg
AV Peak grad: 41.2 mmHg
Ao pk vel: 3.21 m/s
Area-P 1/2: 4.21 cm2
Height: 65 in
S' Lateral: 3.7 cm
Weight: 3616 oz

## 2020-11-07 LAB — LIPID PANEL
Cholesterol: 203 mg/dL — ABNORMAL HIGH (ref 0–200)
HDL: 45 mg/dL (ref >40)
LDL Cholesterol: 135 mg/dL — ABNORMAL HIGH (ref 0–99)
Total CHOL/HDL Ratio: 4.5 RATIO
Triglycerides: 115 mg/dL (ref <150)
VLDL: 23 mg/dL (ref 0–40)

## 2020-11-07 LAB — GLUCOSE, CAPILLARY
Glucose-Capillary: 138 mg/dL — ABNORMAL HIGH (ref 70–99)
Glucose-Capillary: 131 mg/dL — ABNORMAL HIGH (ref 70–99)

## 2020-11-07 MED ORDER — ATORVASTATIN CALCIUM 20 MG PO TABS: 80.0000 mg | ORAL_TABLET | Freq: Every day | ORAL | Status: DC

## 2020-11-07 MED ORDER — MAGNESIUM SULFATE 2 GM/50ML IV SOLN
2.0000 g | Freq: Once | INTRAVENOUS | Status: AC
  Administered 2020-11-07: 10:00:00 2 g via INTRAVENOUS
  Filled 2020-11-07: qty 50

## 2020-11-07 MED ORDER — ATORVASTATIN CALCIUM 80 MG PO TABS: 80.0000 mg | ORAL_TABLET | Freq: Every day | ORAL | 0 refills | Status: DC

## 2020-11-07 NOTE — Discharge Instructions (Signed)
Stroke Prevention Some medical conditions and behaviors are associated with a higher chance of having a stroke. You can help prevent a stroke by making nutrition, lifestyle, and other changes, including managing any medical conditions you may have. What nutrition changes can be made?  Eat healthy foods. You can do this by: Choosing foods high in fiber, such as fresh fruits and vegetables and whole grains. Eating at least 5 or more servings of fruits and vegetables a day. Try to fill half of your plate at each meal with fruits and vegetables. Choosing lean protein foods, such as lean cuts of meat, poultry without skin, fish, tofu, beans, and nuts. Eating low-fat dairy products. Avoiding foods that are high in salt (sodium). This can help lower blood pressure. Avoiding foods that have saturated fat, trans fat, and cholesterol. This can help prevent high cholesterol. Avoiding processed and premade foods. Follow your health care provider's specific guidelines for losing weight, controlling high blood pressure (hypertension), lowering high cholesterol, and managing diabetes. These may include: Reducing your daily calorie intake. Limiting your daily sodium intake to 1,500 milligrams (mg). Using only healthy fats for cooking, such as olive oil, canola oil, or sunflower oil. Counting your daily carbohydrate intake. What lifestyle changes can be made? Maintain a healthy weight. Talk to your health care provider about your ideal weight. Get at least 30 minutes of moderate physical activity at least 5 days a week. Moderate activity includes brisk walking, biking, and swimming. Do not use any products that contain nicotine or tobacco, such as cigarettes and e-cigarettes. If you need help quitting, ask your health care provider. It may also be helpful to avoid exposure to secondhand smoke. Limit alcohol intake to no more than 1 drink a day for nonpregnant women and 2 drinks a day for men. One drink equals 12  oz of beer, 5 oz of wine, or 1 oz of hard liquor. Stop any illegal drug use. Avoid taking birth control pills. Talk to your health care provider about the risks of taking birth control pills if: You are over 35 years old. You smoke. You get migraines. You have ever had a blood clot. What other changes can be made? Manage your cholesterol levels. Eating a healthy diet is important for preventing high cholesterol. If cholesterol cannot be managed through diet alone, you may also need to take medicines. Take any prescribed medicines to control your cholesterol as told by your health care provider. Manage your diabetes. Eating a healthy diet and exercising regularly are important parts of managing your blood sugar. If your blood sugar cannot be managed through diet and exercise, you may need to take medicines. Take any prescribed medicines to control your diabetes as told by your health care provider. Control your hypertension. To reduce your risk of stroke, try to keep your blood pressure below 130/80. Eating a healthy diet and exercising regularly are an important part of controlling your blood pressure. If your blood pressure cannot be managed through diet and exercise, you may need to take medicines. Take any prescribed medicines to control hypertension as told by your health care provider. Ask your health care provider if you should monitor your blood pressure at home. Have your blood pressure checked every year, even if your blood pressure is normal. Blood pressure increases with age and some medical conditions. Get evaluated for sleep disorders (sleep apnea). Talk to your health care provider about getting a sleep evaluation if you snore a lot or have excessive sleepiness. Take over-the-counter   and prescription medicines only as told by your health care provider. Aspirin or blood thinners (antiplatelets or anticoagulants) may be recommended to reduce your risk of forming blood clots that can  lead to stroke. Make sure that any other medical conditions you have, such as atrial fibrillation or atherosclerosis, are managed. What are the warning signs of a stroke? The warning signs of a stroke can be easily remembered as BEFAST. B is for balance. Signs include: Dizziness. Loss of balance or coordination. Sudden trouble walking. E is for eyes. Signs include: A sudden change in vision. Trouble seeing. F is for face. Signs include: Sudden weakness or numbness of the face. The face or eyelid drooping to one side. A is for arms. Signs include: Sudden weakness or numbness of the arm, usually on one side of the body. S is for speech. Signs include: Trouble speaking (aphasia). Trouble understanding. T is for time. These symptoms may represent a serious problem that is an emergency. Do not wait to see if the symptoms will go away. Get medical help right away. Call your local emergency services (911 in the [place] Do not drive yourself to the hospital. Other signs of stroke may include: A sudden, severe headache with no known cause. Nausea or vomiting. Seizure. Where to find more information For more information, visit: American Stroke Association: www.strokeassociation.org National Stroke Association: www.stroke.org Summary You can prevent a stroke by eating healthy, exercising, not smoking, limiting alcohol intake, and managing any medical conditions you may have. Do not use any products that contain nicotine or tobacco, such as cigarettes and e-cigarettes. If you need help quitting, ask your health care provider. It may also be helpful to avoid exposure to secondhand smoke. Remember BEFAST for warning signs of stroke. Get help right away if you or a loved one has any of these signs. This information is not intended to replace advice given to you by your health care provider. Make sure you discuss any questions you have with your health care provider. Document Revised: 07/06/2017  Document Reviewed: 08/29/2016 Elsevier Patient Education  2021 [employer].  

## 2020-11-07 NOTE — Consult Note (Addendum)
[facility] Face-to-Face Psychiatry Consult   Reason for Consult:  Grandiose delusions Referring Physician:  Dr. Leslye Peer Patient Identification: Evan [name] MRN:  [id number] Principal Diagnosis: Acute CVA (cerebrovascular accident) [facility]) Diagnosis:  Principal Problem:   Acute CVA (cerebrovascular accident) [facility]) Active Problems:   Essential hypertension   Chronic diastolic CHF (congestive heart failure) ([place])   Type 2 diabetes mellitus without complication (HCC)   Obstructive sleep apnea on CPAP   Persistent atrial fibrillation (HCC)   CAD (coronary artery disease)   Total Time spent with patient: 45 minutes  Subjective:   Evan [name] is a 65 y.o. male patient with medical h/o HTN, DM, OSA, CAD, admitted with acute cerebrovascular accident. Per chart review, patient has psychiatric history of anxiety, hoarding disorder and delusional disorder.  Psychiatry consult requested for evaluation due to grandiose delusions.  Patient seen, chart reviewed.  HPI: Patient seen in his room, he is calm, cooperative, polite. He is fully oriented in self, place, date and situation. Patient reports no current mental complaints. He reports "good" mood, says that he expected stroke to happen taking in consideration all his comorbid medical comditions and obesity. He denies feeling depressed or more anxious in settings of a new medical condition. He reports he has a place to live and visiting caregiver. He says he does not really like the place where he lives, [employer]: he likes his caregiver [name], who comes every day. Patient denies thoughts of harming self or others. Denies auditory or visual hallucinations. He reports seeing "flotters" for years. 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 He shows some of his "art creations" from his phone and they look like pictures of  celebrities under some photo filter. He expresses some religiosity too. Patient does not express any unsafe believes or plans; does not express delusions of persecution, paranoia, thought broadcasting, ideas of reference. He denies increased energy, racing thoughts, irritable mood, decreased need of sleep. When he asked why he is on Risperidone, he admitted that it was prescribed due to some of his believes and that he stopped taking the medication because "it make it worse". Patient denies any substance abuse.  Past Psychiatric History:  Per chart review, past psych diagnoses were: anxiety, hoarding disorder, delusional disorder. Patient denies history of past psych admissions, suicidal attempts. Past psych medications: Zoloft and Cymbalta for anxiety; Risperidone for delusional disorder.  Social History: Patient lives alone in [place]; he has caregiver [name], who comes every day. He is single. Has no children. He is on disability. No legal issues. No guns.  Risk to Self:   Risk to Others:   Prior Inpatient Therapy:   Prior Outpatient Therapy:    Past Medical History:  Past Medical History:  Diagnosis Date  . Anginal pain ([place])   . Anxiety disorder   . Asthma   . Atresia of esophagus without fistula   . CAD (coronary artery disease)   . Cellulitis   . CHF (congestive heart failure) ([place])    NYHA CLASS III,CHRONIC,DIASTOLIC  . COPD (chronic obstructive pulmonary disease) ([place])   . Diabetes mellitus without complication ([place])   . Edema    RIGHT LOWER LEG  . Gastroesophageal reflux   . H/O: GI bleed   . History of pneumonia    Remote  . History of scarlet fever    Childhood  . Hyperlipidemia   .  Hypertension   . Myocardial infarction ([place]) 2009  . Obesity   . Obstructive sleep apnea   . Pain    CHRONIC BACK / ABDOMINAL  . Panic disorder   . Peripheral venous insufficiency   . PTSD (post-traumatic stress disorder)   . Retinopathy    DIABETIC  .  Stasis, venous   . Vertigo     Past Surgical History:  Procedure Laterality Date  . CARDIAC CATHETERIZATION    . CATARACT EXTRACTION Left   . CATARACT EXTRACTION W/PHACO Right 05/03/2017   Procedure: CATARACT EXTRACTION PHACO AND INTRAOCULAR LENS PLACEMENT (IOC);  Surgeon: Leandrew Koyanagi, MD;  Location: [facility] ORS;  Service: Ophthalmology;  Laterality: Right;  [place] 00:35.3 AP% 12.3 CDE 4.33 Fluid Pack lot # [id number]       . CORONARY ANGIOPLASTY WITH STENT PLACEMENT  2002  . CORONARY ANGIOPLASTY WITH STENT PLACEMENT  1999  . CORONARY ARTERY BYPASS GRAFT     x7  . ESOPHAGOGASTRODUODENOSCOPY N/A 09/19/2016   Procedure: ESOPHAGOGASTRODUODENOSCOPY (EGD);  Surgeon: Lollie Sails, MD;  Location: [facility] ENDOSCOPY;  Service: Endoscopy;  Laterality: N/A;   Family History:  Family History  Problem Relation Age of Onset  . Heart attack Mother   . Hypertension Mother   . Hyperlipidemia Mother   . Heart attack Brother 13       MI  . Coronary artery disease Other    Family Psychiatric  History: unknown Social History:  Social History   Substance and Sexual Activity  Alcohol Use No     Social History   Substance and Sexual Activity  Drug Use No    Social History   Socioeconomic History  . Marital status: Single    Spouse name: Not on file  . Number of children: Not on file  . Years of education: Not on file  . Highest education level: Not on file  Occupational History    Comment: disabled  Tobacco Use  . Smoking status: Never Smoker  . Smokeless tobacco: Never Used  Vaping Use  . Vaping Use: Never used  Substance and Sexual Activity  . Alcohol use: No  . Drug use: No  . Sexual activity: Not Currently  Other Topics Concern  . Not on file  Social History Narrative   Disabled   Single   Social Determinants of Health   Financial Resource Strain: Low Risk   . Difficulty of Paying Living Expenses: Not hard at all  Food Insecurity: No Food Insecurity  . Worried  About Charity fundraiser in the Last Year: Never true  . Ran Out of Food in the Last Year: Never true  Transportation Needs: Not on file  Physical Activity: Inactive  . Days of Exercise per Week: 0 days  . Minutes of Exercise per Session: 0 min  Stress: No Stress Concern Present  . Feeling of Stress : Not at all  Social Connections: Not on file       Allergies:   Allergies  Allergen Reactions  . Penicillin G Hives  . Sulfa Antibiotics Hives  . Tiotropium   . Zoloft [Sertraline Hcl] Other (See Comments)    Labs:  Results for orders placed or performed during the hospital encounter of 11/06/20 (from the past 48 hour(s))  CBC     Status: Abnormal   Collection Time: 11/06/20  7:34 AM  Result Value Ref Range   WBC 8.4 4.0 - 10.5 K/uL   RBC 5.24 4.22 - 5.81 MIL/uL   Hemoglobin 14.5  13.0 - 17.0 g/dL   HCT 45.6 39.0 - 52.0 %   MCV 87.0 80.0 - 100.0 fL   MCH 27.7 26.0 - 34.0 pg   MCHC 31.8 30.0 - 36.0 g/dL   RDW 16.0 (H) 11.5 - 15.5 %   Platelets 259 150 - 400 K/uL   nRBC 0.0 0.0 - 0.2 %    Comment: Performed at [facility], [address] [place], [place] [address]  Basic metabolic panel     Status: Abnormal   Collection Time: 11/06/20  7:34 AM  Result Value Ref Range   Sodium 135 135 - 145 mmol/L   Potassium 4.0 3.5 - 5.1 mmol/L   Chloride 105 98 - 111 mmol/L   CO2 21 (L) 22 - 32 mmol/L   Glucose, Bld 165 (H) 70 - 99 mg/dL    Comment: Glucose reference range applies only to samples taken after fasting for at least 8 hours.   BUN 17 8 - 23 mg/dL   Creatinine, Ser 1.08 0.61 - 1.24 mg/dL   Calcium 8.9 8.9 - 10.3 mg/dL   GFR, Estimated >60 >60 mL/min    Comment: (NOTE) Calculated using the CKD-EPI Creatinine Equation (2021)    Anion gap 9 5 - 15    Comment: Performed at [facility], [address] [place], [place] [address]  Magnesium     Status: None   Collection Time: 11/06/20  7:34 AM  Result Value Ref Range   Magnesium 2.3 1.7 - 2.4 mg/dL     Comment: Performed at [facility], [address] [place], [place] [address]  TSH     Status: None   Collection Time: 11/06/20  7:34 AM  Result Value Ref Range   TSH 2.567 0.350 - 4.500 uIU/mL    Comment: Performed by a 3rd Generation assay with a functional sensitivity of <=0.01 uIU/mL. Performed at [facility], [address] [place], [place] [address]   POC CBG, ED     Status: Abnormal   Collection Time: 11/06/20  7:47 AM  Result Value Ref Range   Glucose-Capillary 180 (H) 70 - 99 mg/dL    Comment: Glucose reference range applies only to samples taken after fasting for at least 8 hours.  Resp Panel by RT-PCR (Flu A&B, Covid) Nasopharyngeal Swab     Status: None   Collection Time: 11/06/20 10:21 AM   Specimen: Nasopharyngeal Swab; Nasopharyngeal(NP) swabs in vial transport medium  Result Value Ref Range   SARS Coronavirus 2 by RT PCR NEGATIVE NEGATIVE    Comment: (NOTE) SARS-CoV-2 target nucleic acids are NOT DETECTED.  The SARS-CoV-2 RNA is generally detectable in upper respiratory specimens during the acute phase of infection. The lowest concentration of SARS-CoV-2 viral copies this assay can detect is 138 copies/mL. A negative result does not preclude SARS-Cov-2 infection and should not be used as the sole basis for treatment or other patient management decisions. A negative result may occur with  improper specimen collection/handling, submission of specimen other than nasopharyngeal swab, presence of viral mutation(s) within the areas targeted by this assay, and inadequate number of viral copies(<138 copies/mL). A negative result must be combined with clinical observations, patient history, and epidemiological information. The expected result is Negative.  Fact Sheet for Patients:  EntrepreneurPulse.com.au  Fact Sheet for Healthcare Providers:  IncredibleEmployment.be  This test is no t yet approved or  cleared by the [place] FDA and  has been authorized for detection and/or diagnosis of SARS-CoV-2 by  FDA under an Emergency Use Authorization (EUA). This EUA will remain  in effect (meaning this test can be used) for the duration of the COVID-19 declaration under Section 564(b)(1) of the Act, 21 U.S.C.section 360bbb-3(b)(1), unless the authorization is terminated  or revoked sooner.       Influenza A by PCR NEGATIVE NEGATIVE   Influenza B by PCR NEGATIVE NEGATIVE    Comment: (NOTE) The Xpert Xpress SARS-CoV-2/FLU/RSV plus assay is intended as an aid in the diagnosis of influenza from Nasopharyngeal swab specimens and should not be used as a sole basis for treatment. Nasal washings and aspirates are unacceptable for Xpert Xpress SARS-CoV-2/FLU/RSV testing.  Fact Sheet for Patients: EntrepreneurPulse.com.au  Fact Sheet for Healthcare Providers: IncredibleEmployment.be  This test is not yet approved or cleared by the [place] FDA and has been authorized for detection and/or diagnosis of SARS-CoV-2 by FDA under an Emergency Use Authorization (EUA). This EUA will remain in effect (meaning this test can be used) for the duration of the COVID-19 declaration under Section 564(b)(1) of the Act, 21 U.S.C. section 360bbb-3(b)(1), unless the authorization is terminated or revoked.  Performed at [facility], [address] [place], [place] [address]   Urine Drug Screen, Qualitative     Status: None   Collection Time: 11/06/20 10:21 AM  Result Value Ref Range   Tricyclic, Ur Screen NONE DETECTED NONE DETECTED   Amphetamines, Ur Screen NONE DETECTED NONE DETECTED   MDMA (Ecstasy)Ur Screen NONE DETECTED NONE DETECTED   Cocaine Metabolite,Ur Rafael Gonzalez NONE DETECTED NONE DETECTED   Opiate, Ur Screen NONE DETECTED NONE DETECTED   Phencyclidine (PCP) Ur S NONE DETECTED NONE DETECTED   Cannabinoid 50 Ng, Ur [place] NONE DETECTED NONE DETECTED    Barbiturates, Ur Screen NONE DETECTED NONE DETECTED   Benzodiazepine, Ur Scrn NONE DETECTED NONE DETECTED   Methadone Scn, Ur NONE DETECTED NONE DETECTED    Comment: (NOTE) Tricyclics + metabolites, urine    Cutoff 1000 ng/mL Amphetamines + metabolites, urine  Cutoff 1000 ng/mL MDMA (Ecstasy), urine              Cutoff 500 ng/mL Cocaine Metabolite, urine          Cutoff 300 ng/mL Opiate + metabolites, urine        Cutoff 300 ng/mL Phencyclidine (PCP), urine         Cutoff 25 ng/mL Cannabinoid, urine                 Cutoff 50 ng/mL Barbiturates + metabolites, urine  Cutoff 200 ng/mL Benzodiazepine, urine              Cutoff 200 ng/mL Methadone, urine                   Cutoff 300 ng/mL  The urine drug screen provides only a preliminary, unconfirmed analytical test result and should not be used for non-medical purposes. Clinical consideration and professional judgment should be applied to any positive drug screen result due to possible interfering substances. A more specific alternate chemical method must be used in order to obtain a confirmed analytical result. Gas chromatography / mass spectrometry (GC/MS) is the preferred confirm atory method. Performed at [facility], [address] [place], [place] [address]   Urinalysis, Routine w reflex microscopic Urine, Clean Catch     Status: Abnormal   Collection Time: 11/06/20 10:21 AM  Result Value Ref Range   Color, Urine YELLOW (A) YELLOW   APPearance CLEAR (A)  CLEAR   Specific Gravity, Urine 1.019 1.005 - 1.030   pH 6.0 5.0 - 8.0   Glucose, UA NEGATIVE NEGATIVE mg/dL   Hgb urine dipstick NEGATIVE NEGATIVE   Bilirubin Urine NEGATIVE NEGATIVE   Ketones, ur NEGATIVE NEGATIVE mg/dL   Protein, ur NEGATIVE NEGATIVE mg/dL   Nitrite NEGATIVE NEGATIVE   Leukocytes,Ua NEGATIVE NEGATIVE    Comment: Performed at [facility], [address] [place], [place] [address]  Protime-INR     Status: None   Collection Time:  11/06/20 10:21 AM  Result Value Ref Range   Prothrombin Time 13.0 11.4 - 15.2 seconds   INR 1.0 0.8 - 1.2    Comment: (NOTE) INR goal varies based on device and disease states. Performed at [facility], [address] [place], [place] [address]   APTT     Status: None   Collection Time: 11/06/20 10:21 AM  Result Value Ref Range   aPTT 30 24 - 36 seconds    Comment: Performed at [facility], [address] [place], [place] [address]  Vitamin B12     Status: None   Collection Time: 11/06/20 10:21 AM  Result Value Ref Range   Vitamin B-12 212 180 - 914 pg/mL    Comment: (NOTE) This assay is not validated for testing neonatal or myeloproliferative syndrome specimens for Vitamin B12 levels. Performed at [facility], [place] [address], [place], [place] [address]   HIV Antibody (routine testing w rflx)     Status: None   Collection Time: 11/06/20 11:24 AM  Result Value Ref Range   HIV Screen 4th Generation wRfx Non Reactive Non Reactive    Comment: Performed at [facility], [place] [address] [place], [place] [address]  Hemoglobin A1c     Status: Abnormal   Collection Time: 11/06/20 11:24 AM  Result Value Ref Range   Hgb A1c MFr Bld 6.8 (H) 4.8 - 5.6 %    Comment: (NOTE) Pre diabetes:          5.7%-6.4%  Diabetes:              >6.4%  Glycemic control for   <7.0% adults with diabetes    Mean Plasma Glucose 148.46 mg/dL    Comment: Performed at [facility] [address] [place],  [address]  CBG monitoring, ED     Status: Abnormal   Collection Time: 11/06/20 12:28 PM  Result Value Ref Range   Glucose-Capillary 128 (H) 70 - 99 mg/dL    Comment: Glucose reference range applies only to samples taken after fasting for at least 8 hours.  Glucose, capillary     Status: Abnormal   Collection Time: 11/06/20  4:33 PM  Result Value Ref Range   Glucose-Capillary 126 (H) 70 - 99 mg/dL    Comment: Glucose reference range applies only to  samples taken after fasting for at least 8 hours.  Glucose, capillary     Status: Abnormal   Collection Time: 11/06/20  9:14 PM  Result Value Ref Range   Glucose-Capillary 120 (H) 70 - 99 mg/dL    Comment: Glucose reference range applies only to samples taken after fasting for at least 8 hours.  Lipid panel     Status: Abnormal   Collection Time: 11/07/20  4:37 AM  Result Value Ref Range   Cholesterol 203 (H) 0 - 200 mg/dL   Triglycerides 115 <150 mg/dL   HDL 45 >40 mg/dL  Total CHOL/HDL Ratio 4.5 RATIO   VLDL 23 0 - 40 mg/dL   LDL Cholesterol 135 (H) 0 - 99 mg/dL    Comment:        Total Cholesterol/HDL:CHD Risk Coronary Heart Disease Risk Table                     Men   Women  1/2 Average Risk   3.4   3.3  Average Risk       5.0   4.4  2 X Average Risk   9.6   7.1  3 X Average Risk  23.4   11.0        Use the calculated Patient Ratio above and the CHD Risk Table to determine the patient's CHD Risk.        ATP III CLASSIFICATION (LDL):  <100     mg/dL   Optimal  100-129  mg/dL   Near or Above                    Optimal  130-159  mg/dL   Borderline  160-189  mg/dL   High  >190     mg/dL   Very High Performed at [facility], [address] [place], [place] [address]   Glucose, capillary     Status: Abnormal   Collection Time: 11/07/20  7:41 AM  Result Value Ref Range   Glucose-Capillary 138 (H) 70 - 99 mg/dL    Comment: Glucose reference range applies only to samples taken after fasting for at least 8 hours.  Glucose, capillary     Status: Abnormal   Collection Time: 11/07/20 11:10 AM  Result Value Ref Range   Glucose-Capillary 131 (H) 70 - 99 mg/dL    Comment: Glucose reference range applies only to samples taken after fasting for at least 8 hours.    Current Facility-Administered Medications  Medication Dose Route Frequency Provider Last Rate Last Admin  .  stroke: mapping our early stages of recovery book   Does not apply Once Agbata, Tochukwu, MD       . 0.9 %  sodium chloride infusion   Intravenous Continuous Agbata, Tochukwu, MD 10 mL/hr at 11/06/20 1234 New Bag at 11/06/20 1234  . acetaminophen (TYLENOL) tablet 650 mg  650 mg Oral Q4H PRN Agbata, Tochukwu, MD       Or  . acetaminophen (TYLENOL) 160 MG/5ML solution 650 mg  650 mg Per Tube Q4H PRN Agbata, Tochukwu, MD       Or  . acetaminophen (TYLENOL) suppository 650 mg  650 mg Rectal Q4H PRN Agbata, Tochukwu, MD      . aspirin chewable tablet 81 mg  81 mg Oral Daily Agbata, Tochukwu, MD   81 mg at 11/07/20 0830  . cholecalciferol (VITAMIN D3) tablet 1,000 Units  1,000 Units Oral Daily Agbata, Tochukwu, MD   1,000 Units at 11/07/20 0832  . ezetimibe (ZETIA) tablet 10 mg  10 mg Oral Daily Agbata, Tochukwu, MD   10 mg at 11/07/20 0830  . insulin aspart (novoLOG) injection 0-20 Units  0-20 Units Subcutaneous TID WC Agbata, Tochukwu, MD   3 Units at 11/07/20 1140  . mometasone-formoterol (DULERA) 100-5 MCG/ACT inhaler 2 puff  2 puff Inhalation BID Agbata, Tochukwu, MD   2 puff at 11/07/20 0832  . omega-3 acid ethyl esters (LOVAZA) capsule 1,000 mg  1,000 mg Oral Daily Agbata, Tochukwu, MD   1,000 mg at 11/07/20 0830  . pantoprazole (PROTONIX)  EC tablet 40 mg  40 mg Oral Daily Agbata, Tochukwu, MD   40 mg at 11/07/20 0831  . risperiDONE (RISPERDAL) tablet 0.5 mg  0.5 mg Oral BID Agbata, Tochukwu, MD   0.5 mg at 11/07/20 0831  . rivaroxaban (XARELTO) tablet 20 mg  20 mg Oral Q supper Agbata, Tochukwu, MD   20 mg at 11/06/20 2102  . rosuvastatin (CRESTOR) tablet 40 mg  40 mg Oral Daily Agbata, Tochukwu, MD   40 mg at 11/07/20 0831     Psychiatric Specialty Exam:  Appearance:  CM, appearing stated age, appears obese;  wearing appropriate hospital clothes, with acceptable grooming and hygiene. Normal level of alertness and appropriate facial expression.  Attitude/Behavior: calm, cooperative, engaging with appropriate eye contact.  Motor: WNL; dyskinesias not evident.   Speech: spontaneous,  clear, coherent, normal comprehension.  Mood: euthymic, " good ".  Affect: appropriately-reactive, full range.  Thought process: patient appears coherent, organized, goal-directed.  Thought content: patient denies suicidal thoughts, denies homicidal thoughts. Patient expresses grandiose delusions.  Thought perception: patient denies auditory and visual hallucinations, no illusions, no depersonalizations. Did not appear internally stimulated.  Cognition: patient is alert and oriented in self, place, date; with intact abstract, fund of knowledge, attention and concentration.  Insight: limited, in regards of understanding of presence, nature, cause, and significance of mental or emotional problem.  Judgement: limited, in regards of ability to make good decisions concerning the appropriate thing to do in various situations, including ability to form opinions regarding their mental health condition.  Physical Exam: Physical Exam ROS Blood pressure 134/82, pulse 77, temperature 97.8 F (36.6 C), resp. rate 19, height 5\' 5"  (1.651 m), weight 102.5 kg, SpO2 97 %. Body mass index is 37.61 kg/m.  Treatment Plan Summary: ASSESSMENT: Mr. Wynter is a 65yo M patient with psychiatric history of anxiety, hoarding disorder and delusional disorder, and medical h/o HTN, DM, OSA, CAD, admitted with acute cerebrovascular accident. Psychiatry consult requested for evaluation due to grandiose delusions. During my assessment, pt is calm, cooperative, and mostly logical. Patient denies suicidal, homicidal thoughts,  hallucinations and paranoia; patient does not appear to be gravely disabled or at acute risk of harming self or others.  Patient does express grandiose delusions that is likely part of his delusional disorder. Patient does not express any other symptoms of psychosis or mania. It appears, that patient is functional and his behavior unrelated to the delusions is normal. Delusional thinking in such  patients is pervasive and chronic, and patients generally have no insight in their condition. For delusional disorder an acute hospitalization may not be of much benefit, the least restrictive setting for him would be to follow up outpatient to mitigate and treat his chronic condition. Per chart review, patient had a assigned APS caseworker Synetta Fail at [phone]. No one in the office today due to Sunday. Unit SW needs to contact this person or another appropriate person for collateral information re patient`s functioning, so we can decide if he requires or not an inpatient psychiatric admission at this time.   ADDENDUM 1:55pm: spoke with patient`s caregiver, [name]. She reported that Mr. Bias is able to function and take care of self. She admitted that he has some "weird believes" for years and it does not affect his daily life or life of others. She denies any safety concerns as well. She is visiting Mr. Strain every day and will report to his PCP or call ER in case of any mental or safety concerns.Marland Kitchen  IMPRESSION: Delusional disorder   RECOMMENDATIONS: -no indication for inpatient psychiatric hospitalization.  -continue Risperidone 0.5mg  PO BID for delusions.   -Follow-up with outpatient mental health provider after discharge or provide the patient with a list of outpatient [place] resources.    Larita Fife, MD 11/07/2020 12:34 PM

## 2020-11-07 NOTE — Evaluation (Signed)
Occupational Therapy Evaluation Patient Details Name: Evan [name] MRN: [id number] DOB: 1955-08-19 Today's Date: 11/07/2020    History of Present Illness Pt is a 65 y/o M presenting to ED from home on 11/06/20 with c/c of acute BLE numbness, dizzy, lightheaded, & HA for 1 week. MRI reveals acute/subacute nonhmorrhagic 9 mm R thalamic infarct. Pt did not receive TPA since he was outside of the window upon presentation (initially reported symptoms began at [facility]). He does not have a psychiatric diagnosis in the chart but is on risperidone as an outpatient. PMH: morbid obesity, CAD s/p PCI/s/p CABG, chronic diastolic dysfunction CHF, OSA, a-fib on anticoagulation, COVID 19 (09/23/20), DM, anginal pain, anxiety, asthma, HLD, MI, panic disorder, PTSD, peripheral venous insufficiency, diabetic retinopathy, venous statis, vertigo   Clinical Impression   Pt seen for OT eavluation this date in setting of acute hospitalziation d/t CVA. Pt presents this date with symptoms of decreased sensation/heaviness that he reported on admission, primarily resolved. OT assesses coordination, strength, and sensation of UEs and pt is equal bilaterally in all aspects. In addition, pt able to perform ADL transfers and fxl mobility with MOD I with no assist from OT. Pt demos G balance and reports he has appropriate sensation in his LEs at this time. Pt educated re: basic [facility] tasks/exercises (listed below) to maintain skills and function as his pinch and grip are grossly 4/5 MMT. Pt with good understanding.  Will complete OT order at this time as not further needs are identified.     Follow Up Recommendations  No OT follow up    Equipment Recommendations  None recommended by OT    Recommendations for Other Services       Precautions / Restrictions Precautions Precautions: None Restrictions Weight Bearing Restrictions: No      Mobility Bed Mobility               General bed mobility comments: up to chair  pre/post session    Transfers Overall transfer level: Needs assistance Equipment used: None Transfers: Sit to/from Stand;Stand Pivot Transfers Sit to Stand: Modified independent (Device/Increase time) Stand pivot transfers: Modified independent (Device/Increase time)            Balance Overall balance assessment: Modified Independent                                         ADL either performed or assessed with clinical judgement   ADL Overall ADL's : Modified independent;At baseline                                             Vision Patient Visual Report: No change from baseline       Perception     Praxis      Pertinent Vitals/Pain Pain Assessment: Faces Faces Pain Scale: Hurts a little bit Pain Location: "spot in the center of my back", notes it's been occuring for the past ~7 months Pain Descriptors / Indicators: Other (Comment) (no visible signs of pain and doesnt describe) Pain Intervention(s): Limited activity within patient's tolerance;Monitored during session     Hand Dominance Right   Extremity/Trunk Assessment Upper Extremity Assessment Upper Extremity Assessment: Overall WFL for tasks assessed (sensation equal bilaterally, FMC equal bilaterally. Pt able to write name and hold utensil  at his baseline)   Lower Extremity Assessment Lower Extremity Assessment: Overall WFL for tasks assessed       Communication Communication Communication: No difficulties   Cognition Arousal/Alertness: Awake/alert Behavior During Therapy: WFL for tasks assessed/performed Overall Cognitive Status: Difficult to assess                                 General Comments: AxO x 4, but requires frequent gentle redirection to task at hand as pt perseverative & insistent on showing OT his friends & family on his phone who turn out to be old live streams of various celebrities.   General Comments       Exercises Other  Exercises Other Exercises: OT educates re: role of OT and engages pt in [facility] tasks including digit flex/ext, abd/add, and opposition   Shoulder Instructions      Home Living Family/patient expects to be discharged to:: Private residence Living Arrangements: Alone Available Help at Discharge: Personal care attendant (care attendants 2 hours/day, 7 days/week) Type of Home: Apartment Home Access: Level entry     Home Layout: One level               Home Equipment: Other (comment) (three wheel walker)   Additional Comments: 1 fall several weeks ago but none others      Prior Functioning/Environment Level of Independence: Independent with assistive device(s);Needs assistance  Gait / Transfers Assistance Needed: ambulatory with 3 wheeled walker ADL's / Homemaking Assistance Needed: cooks, bathes & dresses himself without assistance, reports his personal care attendants assist with house cleaning & "keep him company, will sit and talk"            OT Problem List: Decreased activity tolerance      OT Treatment/Interventions: Self-care/ADL training;Therapeutic activities    OT Goals(Current goals can be found in the care plan section) Acute Rehab OT Goals Patient Stated Goal: none stated OT Goal Formulation: All assessment and education complete, DC therapy  OT Frequency: Min 1X/week   Barriers to D/C:            Co-evaluation              AM-PAC OT "6 Clicks" Daily Activity     Outcome Measure Help from another person eating meals?: None Help from another person taking care of personal grooming?: None Help from another person toileting, which includes using toliet, bedpan, or urinal?: None Help from another person bathing (including washing, rinsing, drying)?: None Help from another person to put on and taking off regular upper body clothing?: None Help from another person to put on and taking off regular lower body clothing?: None 6 Click Score: 24   End of  Session Nurse Communication: Mobility status  Activity Tolerance: Patient tolerated treatment well Patient left: in chair;with call bell/phone within reach                   Time: 0926-0948 OT Time Calculation (min): 22 min Charges:  OT General Charges $OT Visit: 1 Visit OT Evaluation $OT Eval Low Complexity: 1 Low OT Treatments $Self Care/Home Management : 8-22 mins  Gerrianne Scale, [place], OTR/L ascom [phone] 11/07/20, 2:13 PM

## 2020-11-07 NOTE — TOC Initial Note (Signed)
Transition of Care [facility]) - Initial/Assessment Note    Patient Details  Name: Evan [name] MRN: [id number] Date of Birth: 1955-12-14  Transition of Care [facility] CM/SW Contact:    Harriet Masson, RN Phone Number:[phone] 11/07/2020, 10:44 AM  Clinical Narrative:                 Spoke with pt today concerning referral that pt will be "kick" out of where he lives. RN inquired further and pt states those issues have been resolved. Pt verified her has an aide Doctor, general practice) who assist with transportation for hospital pick up and medical appointments. States his medications are delivered directly to his home. Pt has DME for a rollator, WR, cane and bars in his bathroom. No other needs presented at this time.  TOC team will remain available for discharge needs.  Expected Discharge Plan: Home/Self Care Barriers to Discharge: Continued Medical Work up   Patient Goals and CMS Choice        Expected Discharge Plan and Services Expected Discharge Plan: Home/Self Care   Discharge Planning Services: CM Consult   Living arrangements for the past 2 months: Apartment                                      Prior Living Arrangements/Services Living arrangements for the past 2 months: Apartment Lives with:: Roommate Patient language and need for interpreter reviewed:: No Do you feel safe going back to the place where you live?: Yes      Need for Family Participation in Patient Care: Yes (Comment) (aide Annette) Care giver support system in place?: Yes (comment)   Criminal Activity/Legal Involvement Pertinent to Current Situation/Hospitalization: No - Comment as needed  Activities of Daily Living Home Assistive Devices/Equipment: CPAP ADL Screening (condition at time of admission) Patient's cognitive ability adequate to safely complete daily activities?: Yes Is the patient deaf or have difficulty hearing?: No Does the patient have difficulty seeing, even when wearing  glasses/contacts?: No Does the patient have difficulty concentrating, remembering, or making decisions?: No Patient able to express need for assistance with ADLs?: No Does the patient have difficulty dressing or bathing?: No Independently performs ADLs?: Yes (appropriate for developmental age) Does the patient have difficulty walking or climbing stairs?: No Weakness of Legs: Both Weakness of Arms/Hands: None  Permission Sought/Granted   Permission granted to share information with : Yes, Verbal Permission Granted              Emotional Assessment Appearance:: Appears older than stated age Attitude/Demeanor/Rapport: Engaged Affect (typically observed): Accepting Orientation: : Oriented to Self,Oriented to Place,Oriented to Situation,Oriented to  Time Alcohol / Substance Use: Not Applicable    Admission diagnosis:  Acute CVA (cerebrovascular accident) [facility]) [I63.9] Acute stroke due to ischemia [facility]) [I63.9] Patient Active Problem List   Diagnosis Date Noted  . Acute CVA (cerebrovascular accident) ([place]) 11/06/2020  . Lab test positive for detection of COVID-19 virus 09/27/2020  . Dysphagia 08/31/2020  . Atherosclerosis of aorta ([place]) 12/04/2019  . Persistent atrial fibrillation ([place]) 09/12/2019  . CAD (coronary artery disease) 09/12/2019  . Chronic venous stasis 09/12/2019  . Hoarding disorder 09/12/2019  . Cervical spinal stenosis 09/12/2019  . Diabetic retinopathy ([place]) 09/12/2019  . COPD, mild ([place]) 09/12/2019  . Osteoporosis 09/09/2019  . History of prostate cancer 09/09/2019  . Anxiety 08/10/2019  . Obstructive sleep apnea on CPAP 08/10/2019  . Hyperlipidemia associated  with type 2 diabetes mellitus ([place]) 08/10/2019  . Chronic diastolic CHF (congestive heart failure) ([place]) 01/09/2014  . Esophageal dysmotility 09/12/2013  . Type 2 diabetes mellitus without complication ([place]) 74/73/4037  . Obesity, morbid (more than 100 lbs over ideal weight or BMI > 40) ([place])  07/26/2011  . OLD MYOCARDIAL INFARCTION 03/02/2010  . Essential hypertension 03/20/2009  . CORONARY ATHEROSCLEROSIS, ARTERY BYPASS GRAFT 03/20/2009   PCP:  [name], NP Pharmacy:   [address], [place] [address] Phone: [phone] Fax: [phone]     Social Determinants of Health (SDOH) Interventions    Readmission Risk Interventions No flowsheet data found.

## 2020-11-07 NOTE — Discharge Summary (Signed)
[facility] NAME: Evan [name]    MR#:  [id number]  DATE OF BIRTH:  11/13/1955  DATE OF ADMISSION:  11/06/2020 ADMITTING PHYSICIAN: Collier Bullock, MD  DATE OF DISCHARGE: 11/07/2020  PRIMARY CARE PHYSICIAN: [name], NP    ADMISSION DIAGNOSIS:  Acute CVA (cerebrovascular accident) ([place]) [I63.9] Acute stroke due to ischemia ([place]) [I63.9]  DISCHARGE DIAGNOSIS:  Principal Problem:   Acute CVA (cerebrovascular accident) ([place]) Active Problems:   Essential hypertension   Chronic diastolic CHF (congestive heart failure) (HCC)   Type 2 diabetes mellitus without complication (HCC)   Obstructive sleep apnea on CPAP   Persistent atrial fibrillation (HCC)   CAD (coronary artery disease)   SECONDARY DIAGNOSIS:   Past Medical History:  Diagnosis Date  . Anginal pain ([place])   . Anxiety disorder   . Asthma   . Atresia of esophagus without fistula   . CAD (coronary artery disease)   . Cellulitis   . CHF (congestive heart failure) ([place])    NYHA CLASS III,CHRONIC,DIASTOLIC  . COPD (chronic obstructive pulmonary disease) ([place])   . Diabetes mellitus without complication ([place])   . Edema    RIGHT LOWER LEG  . Gastroesophageal reflux   . H/O: GI bleed   . History of pneumonia    Remote  . History of scarlet fever    Childhood  . Hyperlipidemia   . Hypertension   . Myocardial infarction ([place]) 2009  . Obesity   . Obstructive sleep apnea   . Pain    CHRONIC BACK / ABDOMINAL  . Panic disorder   . Peripheral venous insufficiency   . PTSD (post-traumatic stress disorder)   . Retinopathy    DIABETIC  . Stasis, venous   . Vertigo     HOSPITAL COURSE:   1.  Acute CVA.  MRI showing a 9 mm right thalamic infarct.  Patient seen by neurology.  Will need neurological follow-up as outpatient.  Continue Xarelto and aspirin.  Crestor changed over to Lipitor as per neurology.  CT angio did show 60% stenosis right internal carotid  artery which will need to be followed up.  Echocardiogram did not show any clot in the heart.  Did show moderate aortic stenosis.  LDL 135.  Can consider PCSK9 inhibitor if LDL does not come down further with changing statin 2.  Type 2 diabetes mellitus with hyperlipidemia.  Can go back on empagliflozin as outpatient.  Hemoglobin A1c 6.8. 3.  Chronic diastolic congestive heart failure with moderate aortic stenosis.  Can go back on Lasix and Toprol but will hold off on lisinopril for right now.  Can restart as outpatient.  Follow-up with Dr. [name] cardiology as outpatient. 4.  Persistent atrial fibrillation on Xarelto for anticoagulation. 5.  Obstructive sleep apnea on CPAP at night 6.  Obesity with a BMI of 37.61 7.  Delusional disorder and grandiose ideas.  Continue Risperdal twice daily.  Appreciate psychiatric consultation. 8.  Right carotid stenosis need to watch this closely for progression.  DISCHARGE CONDITIONS:   Satisfactory  CONSULTS OBTAINED:  Treatment Team:  Larita Fife, MD  DRUG ALLERGIES:   Allergies  Allergen Reactions  . Penicillin G Hives  . Sulfa Antibiotics Hives  . Tiotropium   . Zoloft [Sertraline Hcl] Other (See Comments)    DISCHARGE MEDICATIONS:   Allergies as of 11/07/2020      Reactions   Penicillin G Hives   Sulfa Antibiotics Hives   Tiotropium  Zoloft [sertraline Hcl] Other (See Comments)      Medication List    STOP taking these medications   eucerin lotion   lisinopril 10 MG tablet Commonly known as: ZESTRIL   rosuvastatin 40 MG tablet Commonly known as: CRESTOR     TAKE these medications   acetaminophen 500 MG tablet Commonly known as: TYLENOL Take 500 mg by mouth every 6 (six) hours as needed.   aspirin 81 MG chewable tablet Chew 81 mg by mouth daily.   atorvastatin 80 MG tablet Commonly known as: Lipitor Take 1 tablet (80 mg total) by mouth daily.   budesonide-formoterol 80-4.5 MCG/ACT inhaler Commonly known as:  SYMBICORT Inhale 2 puffs into the lungs 2 (two) times daily.   cholecalciferol 25 MCG (1000 UNIT) tablet Commonly known as: VITAMIN D3 Take 1 tablet (1,000 Units total) by mouth daily.   empagliflozin 10 MG Tabs tablet Commonly known as: Jardiance Take 1 tablet (10 mg total) by mouth daily.   eucerin cream Apply topically as needed for dry skin.   ezetimibe 10 MG tablet Commonly known as: Zetia Take 1 tablet (10 mg total) by mouth daily.   furosemide 40 MG tablet Commonly known as: LASIX TAKE 2 TABLETS BY MOUTH ONCE EVERY MORNING AND 1 TABLET ONCE EVERY EVENING   Medical Compression Stockings Misc 1 Package by Does not apply route daily.   metoprolol succinate 50 MG 24 hr tablet Commonly known as: Toprol XL Take 1 tablet (50 mg total) by mouth daily.   nitroGLYCERIN 0.4 MG SL tablet Commonly known as: NITROSTAT Place 1 tablet (0.4 mg total) under the tongue every 5 (five) minutes as needed for chest pain.   OMEGA 3 500 PO Take 500 mg by mouth daily.   OneTouch Delica Lancets 01E Misc Use to check blood sugar 2-3 times a day.   OneTouch Verio test strip Generic drug: glucose blood Use to check blood sugar 3 to 4 times a day.   OneTouch Verio w/Device Kit Use to check blood sugar 3 to 4 times a day and document.  Please bring to visits for review.   pantoprazole 40 MG tablet Commonly known as: PROTONIX Take 1 tablet (40 mg total) by mouth daily.   risperiDONE 0.5 MG tablet Commonly known as: RISPERDAL Take 1 tablet (0.5 mg total) by mouth 2 (two) times daily.   rivaroxaban 20 MG Tabs tablet Commonly known as: XARELTO Take 1 tablet (20 mg total) by mouth at bedtime.   sharps container 1 each by Does not apply route as needed.   triamcinolone 0.1 % Commonly known as: KENALOG APPLY TO AFFECTED AREA(s) TWICE DAILY        DISCHARGE INSTRUCTIONS:   Follow-up PMD 5 days Follow-up cardiology 1 week Follow-up neurology 3 weeks  If you experience  worsening of your admission symptoms, develop shortness of breath, life threatening emergency, suicidal or homicidal thoughts you must seek medical attention immediately by calling 911 or calling your MD immediately  if symptoms less severe.  You Must read complete instructions/literature along with all the possible adverse reactions/side effects for all the Medicines you take and that have been prescribed to you. Take any new Medicines after you have completely understood and accept all the possible adverse reactions/side effects.   Please note  You were cared for by a hospitalist during your hospital stay. If you have any questions about your discharge medications or the care you received while you were in the hospital after you are discharged, you  can call the unit and asked to speak with the hospitalist on call if the hospitalist that took care of you is not available. Once you are discharged, your primary care physician will handle any further medical issues. Please note that NO REFILLS for any discharge medications will be authorized once you are discharged, as it is imperative that you return to your primary care physician (or establish a relationship with a primary care physician if you do not have one) for your aftercare needs so that they can reassess your need for medications and monitor your lab values.    Today   CHIEF COMPLAINT:   Chief Complaint  Patient presents with  . Numbness    HISTORY OF PRESENT ILLNESS:  Evan [name]  is a 65 y.o. male came in with numbness and found to have a stroke.   VITAL SIGNS:  Blood pressure 134/82, pulse 77, temperature 97.8 F (36.6 C), resp. rate 19, height _0  (1.651 m), weight 102.5 kg, SpO2 97 %.  I/O:    Intake/Output Summary (Last 24 hours) at 11/07/2020 1410 Last data filed at 11/07/2020 1034 Gross per 24 hour  Intake 864.33 ml  Output --  Net 864.33 ml    PHYSICAL EXAMINATION:  GENERAL:  65 y.o.-year-old patient lying in  the bed with no acute distress.  EYES: Pupils equal, round, reactive to light and accommodation. No scleral icterus. Extraocular muscles intact.  HEENT: Head atraumatic, normocephalic. Oropharynx and nasopharynx clear.   LUNGS: Normal breath sounds bilaterally, no wheezing, rales,rhonchi or crepitation. No use of accessory muscles of respiration.  CARDIOVASCULAR: S1, S2 irregular irregular.  2/6 systolic murmurs.  No rubs, or gallops.  ABDOMEN: Soft, non-tender, non-distended.  EXTREMITIES: No pedal edema, cyanosis, or clubbing.  NEUROLOGIC: Cranial nerves II through XII are intact. Muscle strength 5/5 in all extremities. Sensation intact. Gait not checked.  PSYCHIATRIC: The patient is alert and answers all questions.  Some paranoid and grandiose thoughts. SKIN: No obvious rash, lesion, or ulcer.   DATA REVIEW:   CBC Recent Labs  Lab 11/06/20 0734  WBC 8.4  HGB 14.5  HCT 45.6  PLT 259    Chemistries  Recent Labs  Lab 11/06/20 0734  NA 135  K 4.0  CL 105  CO2 21*  GLUCOSE 165*  BUN 17  CREATININE 1.08  CALCIUM 8.9  MG 2.3     Microbiology Results  Results for orders placed or performed during the hospital encounter of 11/06/20  Resp Panel by RT-PCR (Flu A&B, Covid) Nasopharyngeal Swab     Status: None   Collection Time: 11/06/20 10:21 AM   Specimen: Nasopharyngeal Swab; Nasopharyngeal(NP) swabs in vial transport medium  Result Value Ref Range Status   SARS Coronavirus 2 by RT PCR NEGATIVE NEGATIVE Final    Comment: (NOTE) SARS-CoV-2 target nucleic acids are NOT DETECTED.  The SARS-CoV-2 RNA is generally detectable in upper respiratory specimens during the acute phase of infection. The lowest concentration of SARS-CoV-2 viral copies this assay can detect is 138 copies/mL. A negative result does not preclude SARS-Cov-2 infection and should not be used as the sole basis for treatment or other patient management decisions. A negative result may occur with  improper  specimen collection/handling, submission of specimen other than nasopharyngeal swab, presence of viral mutation(s) within the areas targeted by this assay, and inadequate number of viral copies(<138 copies/mL). A negative result must be combined with clinical observations, patient history, and epidemiological information. The expected result is Negative.  Fact Sheet  for Patients:  EntrepreneurPulse.com.au  Fact Sheet for Healthcare Providers:  IncredibleEmployment.be  This test is no t yet approved or cleared by the [place] FDA and  has been authorized for detection and/or diagnosis of SARS-CoV-2 by FDA under an Emergency Use Authorization (EUA). This EUA will remain  in effect (meaning this test can be used) for the duration of the COVID-19 declaration under Section 564(b)(1) of the Act, 21 U.S.C.section 360bbb-3(b)(1), unless the authorization is terminated  or revoked sooner.       Influenza A by PCR NEGATIVE NEGATIVE Final   Influenza B by PCR NEGATIVE NEGATIVE Final    Comment: (NOTE) The Xpert Xpress SARS-CoV-2/FLU/RSV plus assay is intended as an aid in the diagnosis of influenza from Nasopharyngeal swab specimens and should not be used as a sole basis for treatment. Nasal washings and aspirates are unacceptable for Xpert Xpress SARS-CoV-2/FLU/RSV testing.  Fact Sheet for Patients: EntrepreneurPulse.com.au  Fact Sheet for Healthcare Providers: IncredibleEmployment.be  This test is not yet approved or cleared by the [place] FDA and has been authorized for detection and/or diagnosis of SARS-CoV-2 by FDA under an Emergency Use Authorization (EUA). This EUA will remain in effect (meaning this test can be used) for the duration of the COVID-19 declaration under Section 564(b)(1) of the Act, 21 U.S.C. section 360bbb-3(b)(1), unless the authorization is terminated or revoked.  Performed at  [facility], [address] [place], [place] [address]     RADIOLOGY:  CT ANGIO HEAD W OR WO CONTRAST  Result Date: 11/06/2020 CLINICAL DATA:  Bilateral lower extremity numbness and heaviness since waking up at 3 a.m. Recent episode of asymptomatic COVID. Right thalamic nonhemorrhagic infarct. EXAM: CT ANGIOGRAPHY HEAD AND NECK TECHNIQUE: Multidetector CT imaging of the head and neck was performed using the standard protocol during bolus administration of intravenous contrast. Multiplanar CT image reconstructions and MIPs were obtained to evaluate the vascular anatomy. Carotid stenosis measurements (when applicable) are obtained utilizing NASCET criteria, using the distal internal carotid diameter as the denominator. CONTRAST:  16m OMNIPAQUE IOHEXOL 350 MG/ML SOLN COMPARISON:  MR head without contrast/2/22 FINDINGS: CT HEAD FINDINGS Brain: The acute/subacute nonhemorrhagic infarct of the right thalamus is again noted. Mild generalized atrophy and moderate diffuse white matter disease is stable. No new cortical infarcts present. Basal ganglia are otherwise intact. Brainstem and cerebellum within normal limits. Vascular: Atherosclerotic calcifications are present within the cavernous internal carotid arteries bilaterally and at the dural margin of both vertebral arteries. No hyperdense vessel is present. Skull: Calvarium is intact. No focal lytic or blastic lesions are present. No significant extracranial soft tissue lesion is present. Sinuses: The right frontal sinus is hypoplastic. Mild anterior right ethmoid sinus disease is present. The paranasal sinuses and mastoid air cells are otherwise clear. Orbits: Bilateral lens replacements are noted. Globes and orbits are otherwise unremarkable. Review of the MIP images confirms the above findings CTA NECK FINDINGS Aortic arch: 3 vessel arch configuration is present. Atherosclerotic changes are present the distal aorta. There is no significant stenosis  at the great vessel origins. No aneurysm is present. Right carotid system: The right common carotid artery is within normal limits. Dense calcification is present at the bifurcation. Lumen is narrowed to scratched at the right ICA lumen is narrowed to 2 mm. This compares with distal measurement of 5.5 mm. Left carotid system: The left common carotid artery is within normal limits. Atherosclerotic changes are present at the bifurcation. Additional mural calcifications are present slightly more distally in  the left ICA without significant stenosis or pseudoaneurysm. Vertebral arteries: The vertebral arteries are codominant. Both vertebral arteries originate from the subclavian arteries without significant stenosis. Calcifications are present bilaterally. No significant stenosis is present in either vertebral artery in the neck. Skeleton: Degenerative changes are present at C1-2. Posterior arch of C1 is anterior, narrowing the spinal canal. No acute or healing fractures are present. Vertebral body heights are otherwise normal. Patient is status post median sternotomy. No focal lytic or blastic lesions are present. Other neck: 6 mm incidental posterior left thyroid nodule noted. Soft tissues the neck are otherwise unremarkable. Salivary glands within limits. No significant adenopathy is present. No focal mucosal lesions. Upper chest: Scattered ground-glass attenuation both lungs likely represents mild edema. Coronary artery calcifications are present. Patient is status post CABG. Review of the MIP images confirms the above findings CTA HEAD FINDINGS Anterior circulation: The internal carotid arteries demonstrates some atherosclerotic calcifications bilaterally without significant stenosis through the ICA termini. The A1 and M1 segments are normal. Right A1 segment is dominant. Anterior communicating artery is patent. MCA bifurcations are within normal limits bilaterally. The ACA and MCA branch vessels are within normal  limits. Posterior circulation: Atherosclerotic changes are present at the dural margin of both vertebral arteries. Narrowing 50% present on the right. Left PICA origin is visualized and normal. Right AICA is dominant. Vertebrobasilar junction is normal. The basilar artery is limits. Left posterior cerebral artery is of fetal type with a small left P1 contribution. There is mild narrowing proximal right P1 segment. The distal PCA branch vessels are within normal limits bilaterally. Venous sinuses: The dural sinuses are patent. The straight sinus deep cerebral veins are intact. Cortical veins are unremarkable. Anatomic variants: Fetal type left posterior cerebral artery. Review of the MIP images confirms the above findings IMPRESSION: 1. Stable appearance of acute/subacute nonhemorrhagic infarct of the right thalamus. 2. Stable atrophy and moderate diffuse white matter disease. 3. 60% stenosis of the proximal right internal carotid artery at the bifurcation. 4. Atherosclerotic changes at the left carotid bifurcation and cavernous internal carotid arteries bilaterally without significant stenosis. 5. Atherosclerotic changes at the dural margin of both vertebral arteries without to 50% narrowing on the right. 6. Mild narrowing of the proximal right P1 segment. This could impact the thalamostriate perforators. 7. No other significant proximal stenosis, aneurysm, or branch vessel occlusion within the Circle of Willis. 8. Aortic Atherosclerosis (ICD10-I70.0). Electronically Signed   By: San Morelle M.D.   On: 11/06/2020 12:55   CT ANGIO NECK W OR WO CONTRAST  Result Date: 11/06/2020 CLINICAL DATA:  Bilateral lower extremity numbness and heaviness since waking up at 3 a.m. Recent episode of asymptomatic COVID. Right thalamic nonhemorrhagic infarct. EXAM: CT ANGIOGRAPHY HEAD AND NECK TECHNIQUE: Multidetector CT imaging of the head and neck was performed using the standard protocol during bolus administration of  intravenous contrast. Multiplanar CT image reconstructions and MIPs were obtained to evaluate the vascular anatomy. Carotid stenosis measurements (when applicable) are obtained utilizing NASCET criteria, using the distal internal carotid diameter as the denominator. CONTRAST:  77m OMNIPAQUE IOHEXOL 350 MG/ML SOLN COMPARISON:  MR head without contrast/2/22 FINDINGS: CT HEAD FINDINGS Brain: The acute/subacute nonhemorrhagic infarct of the right thalamus is again noted. Mild generalized atrophy and moderate diffuse white matter disease is stable. No new cortical infarcts present. Basal ganglia are otherwise intact. Brainstem and cerebellum within normal limits. Vascular: Atherosclerotic calcifications are present within the cavernous internal carotid arteries bilaterally and at the dural margin of  both vertebral arteries. No hyperdense vessel is present. Skull: Calvarium is intact. No focal lytic or blastic lesions are present. No significant extracranial soft tissue lesion is present. Sinuses: The right frontal sinus is hypoplastic. Mild anterior right ethmoid sinus disease is present. The paranasal sinuses and mastoid air cells are otherwise clear. Orbits: Bilateral lens replacements are noted. Globes and orbits are otherwise unremarkable. Review of the MIP images confirms the above findings CTA NECK FINDINGS Aortic arch: 3 vessel arch configuration is present. Atherosclerotic changes are present the distal aorta. There is no significant stenosis at the great vessel origins. No aneurysm is present. Right carotid system: The right common carotid artery is within normal limits. Dense calcification is present at the bifurcation. Lumen is narrowed to scratched at the right ICA lumen is narrowed to 2 mm. This compares with distal measurement of 5.5 mm. Left carotid system: The left common carotid artery is within normal limits. Atherosclerotic changes are present at the bifurcation. Additional mural calcifications are  present slightly more distally in the left ICA without significant stenosis or pseudoaneurysm. Vertebral arteries: The vertebral arteries are codominant. Both vertebral arteries originate from the subclavian arteries without significant stenosis. Calcifications are present bilaterally. No significant stenosis is present in either vertebral artery in the neck. Skeleton: Degenerative changes are present at C1-2. Posterior arch of C1 is anterior, narrowing the spinal canal. No acute or healing fractures are present. Vertebral body heights are otherwise normal. Patient is status post median sternotomy. No focal lytic or blastic lesions are present. Other neck: 6 mm incidental posterior left thyroid nodule noted. Soft tissues the neck are otherwise unremarkable. Salivary glands within limits. No significant adenopathy is present. No focal mucosal lesions. Upper chest: Scattered ground-glass attenuation both lungs likely represents mild edema. Coronary artery calcifications are present. Patient is status post CABG. Review of the MIP images confirms the above findings CTA HEAD FINDINGS Anterior circulation: The internal carotid arteries demonstrates some atherosclerotic calcifications bilaterally without significant stenosis through the ICA termini. The A1 and M1 segments are normal. Right A1 segment is dominant. Anterior communicating artery is patent. MCA bifurcations are within normal limits bilaterally. The ACA and MCA branch vessels are within normal limits. Posterior circulation: Atherosclerotic changes are present at the dural margin of both vertebral arteries. Narrowing 50% present on the right. Left PICA origin is visualized and normal. Right AICA is dominant. Vertebrobasilar junction is normal. The basilar artery is limits. Left posterior cerebral artery is of fetal type with a small left P1 contribution. There is mild narrowing proximal right P1 segment. The distal PCA branch vessels are within normal limits  bilaterally. Venous sinuses: The dural sinuses are patent. The straight sinus deep cerebral veins are intact. Cortical veins are unremarkable. Anatomic variants: Fetal type left posterior cerebral artery. Review of the MIP images confirms the above findings IMPRESSION: 1. Stable appearance of acute/subacute nonhemorrhagic infarct of the right thalamus. 2. Stable atrophy and moderate diffuse white matter disease. 3. 60% stenosis of the proximal right internal carotid artery at the bifurcation. 4. Atherosclerotic changes at the left carotid bifurcation and cavernous internal carotid arteries bilaterally without significant stenosis. 5. Atherosclerotic changes at the dural margin of both vertebral arteries without to 50% narrowing on the right. 6. Mild narrowing of the proximal right P1 segment. This could impact the thalamostriate perforators. 7. No other significant proximal stenosis, aneurysm, or branch vessel occlusion within the Circle of Willis. 8. Aortic Atherosclerosis (ICD10-I70.0). Electronically Signed   By: Wynetta Fines.D.  On: 11/06/2020 12:55   MR BRAIN WO CONTRAST  Result Date: 11/06/2020 CLINICAL DATA:  Numbness or tingling.  Paresthesias. EXAM: MRI HEAD WITHOUT CONTRAST TECHNIQUE: Multiplanar, multiecho pulse sequences of the brain and surrounding structures were obtained without intravenous contrast. COMPARISON:  CT head 03/01/2020. FINDINGS: Brain: An acute/subacute nonhemorrhagic right thalamic infarct measures up to 9 mm. T2 seek scratched at T2 hyperintensity associated. Periventricular and subcortical T2 hyperintensities bilaterally are otherwise moderately advanced for age. Mild generalized atrophy is noted. Internal auditory canals are within normal limits. A remote lacunar infarct is present the posterior right cerebellum. Brainstem and cerebellum are otherwise unremarkable. Vascular: Flow is present in the major intracranial arteries. Skull and upper cervical spine: Prominent  soft tissue is present at C1-2 anterolisthesis is present at C1-2 with crowding of the spinal canal. No abnormal cord signal is present on the coronal T2 weighted images. Midline structures are otherwise within normal limits. Marrow signal is normal. Sinuses/Orbits: Anterior ethmoid air cells are partially opacified on the right. The right sinus is opacified. The paranasal sinuses and mastoid air cells are otherwise clear. Bilateral lens replacements are noted. Globes and orbits are otherwise unremarkable. IMPRESSION: 1. Acute/subacute nonhemorrhagic 9 mm right thalamic infarct. 2. Mild generalized atrophy and moderate white matter disease is advanced for age. This likely reflects the sequela of chronic microvascular ischemia. 3. Right ethmoid and frontal sinus disease. 4. Prominent soft tissue at C1-2 with crowding of the spinal canal. No abnormal cord signal is present. These results were called by telephone at the time of interpretation on 11/06/2020 at 10:05 am to provider MARK QUALE , who verbally acknowledged these results. Electronically Signed   By: San Morelle M.D.   On: 11/06/2020 10:05   MR LUMBAR SPINE WO CONTRAST  Result Date: 11/06/2020 CLINICAL DATA:  Numbness or tingling with paresthesia. EXAM: MRI LUMBAR SPINE WITHOUT CONTRAST TECHNIQUE: Multiplanar, multisequence MR imaging of the lumbar spine was performed. No intravenous contrast was administered. COMPARISON:  None. FINDINGS: Segmentation: 5 lumbar type vertebrae based on the available coverage Alignment:  Mild anterolisthesis at L4-5 Vertebrae: Dense fatty marrow at L5 and the sacrum. The appearance usually relates to prior radiotherapy and likely relates to history of prostate cancer. No fracture, discitis, or aggressive bone lesion. Conus medullaris and cauda equina: Conus extends to the L1 level. Conus and cauda equina appear normal. Paraspinal and other soft tissues: No acute finding Disc levels: Degenerative changes are  superimposed on a congenitally narrow spinal canal from short pedicles T12- L1: Ventral spondylitic spurring L1-L2: Disc narrowing and bulging with central protrusion. Mild thickening of the ligamentum flavum. Spinal stenosis appears advanced on sagittal images but more moderate on axial slices. Patent foramina L2-L3: Disc narrowing and mild bulging. L3-L4: Disc narrowing and bulging. Facet spurring and ligamentum flavum thickening. Moderate spinal stenosis. L4-L5: Disc narrowing and bulging. Facet osteoarthritis with spurring and mild anterolisthesis. Moderate left foraminal narrowing with facet spur touching the L4 nerve root. L5-S1:Minor facet spurring IMPRESSION: 1. No acute finding. 2. Lumbar spine degeneration which is exacerbated by short pedicles. 3. Spinal stenosis that is moderate to advanced at L1-2 and moderate at L3-4. 4. Up to moderate foraminal narrowing on the left at L5-S1. Electronically Signed   By: Monte Fantasia M.D.   On: 11/06/2020 10:03   ECHOCARDIOGRAM COMPLETE  Result Date: 11/07/2020    ECHOCARDIOGRAM REPORT   Patient Name:   Evan [name] Date of Exam: 11/06/2020 Medical Rec #:  [id number]  Height:       65.0 in Accession #:    [id number]       Weight:       226.0 lb Date of Birth:  21-May-1956         BSA:          2.084 m Patient Age:    1 years         BP:           121/90 mmHg Patient Gender: M                HR:           84 bpm. Exam Location:  [facility] Procedure: 2D Echo, Cardiac Doppler and Color Doppler Indications:     Stroke I63.9  History:         Patient has prior history of Echocardiogram examinations. CHF,                  CAD; Risk Factors:Hypertension.  Sonographer:     Alyse Low Roar Referring Phys:  Jolivue Diagnosing Phys: Ida Rogue MD IMPRESSIONS  1. Left ventricular ejection fraction, by estimation, is 50 to 55%. The left ventricle has low normal function. Septal wall hypokinesis possibly from bundle branch block. There is mild left ventricular  hypertrophy. Left ventricular diastolic parameters are indeterminate.  2. Right ventricular systolic function is normal. The right ventricular size is normal.  3. Left atrial size was mildly dilated.  4. Mild mitral valve regurgitation.  5. The aortic valve was not well visualized. There is moderate calcification of the aortic valve. Aortic valve regurgitation is mild. Moderate aortic valve stenosis by mean gradient measures 21.5 mmHg and aortic valve Vmax measures 3.21 m/s.  6. Poor visulalization FINDINGS  Left Ventricle: Left ventricular ejection fraction, by estimation, is 50 to 55%. The left ventricle has low normal function. The left ventricle has no regional wall motion abnormalities. The left ventricular internal cavity size was normal in size. There is mild left ventricular hypertrophy. Left ventricular diastolic parameters are indeterminate. Right Ventricle: The right ventricular size is normal. No increase in right ventricular wall thickness. Right ventricular systolic function is normal. Left Atrium: Left atrial size was mildly dilated. Right Atrium: Right atrial size was normal in size. Pericardium: There is no evidence of pericardial effusion. Mitral Valve: The mitral valve is normal in structure. Mild mitral valve regurgitation. No evidence of mitral valve stenosis. Tricuspid Valve: The tricuspid valve is normal in structure. Tricuspid valve regurgitation is mild . No evidence of tricuspid stenosis. Aortic Valve: The aortic valve was not well visualized. There is moderate calcification of the aortic valve. Aortic valve regurgitation is mild. Moderate aortic stenosis is present. Aortic valve mean gradient measures 21.5 mmHg. Aortic valve peak gradient measures 41.2 mmHg. Aortic valve area, by VTI measures 0.77 cm. Pulmonic Valve: The pulmonic valve was normal in structure. Pulmonic valve regurgitation is not visualized. No evidence of pulmonic stenosis. Aorta: The aortic root is normal in size and  structure. Venous: The inferior vena cava is normal in size with greater than 50% respiratory variability, suggesting right atrial pressure of 3 mmHg. IAS/Shunts: No atrial level shunt detected by color flow Doppler.  LEFT VENTRICLE PLAX 2D LVIDd:         4.76 cm  Diastology LVIDs:         3.70 cm  LV e' medial:    6.09 cm/s LV PW:         1.17  cm  LV E/e' medial:  22.3 LV IVS:        1.49 cm  LV e' lateral:   9.90 cm/s LVOT diam:     1.90 cm  LV E/e' lateral: 13.7 LV SV:         50 LV SV Index:   24 LVOT Area:     2.84 cm  RIGHT VENTRICLE RV Mid diam:    3.09 cm RV S prime:     9.79 cm/s TAPSE (M-mode): 1.6 cm LEFT ATRIUM           Index       RIGHT ATRIUM           Index LA diam:      4.50 cm 2.16 cm/m  RA Area:     20.50 cm LA Vol (A4C): 76.2 ml 36.57 ml/m RA Volume:   59.20 ml  28.41 ml/m  AORTIC VALVE                    PULMONIC VALVE AV Area (Vmax):    0.91 cm     PV Vmax:        1.11 m/s AV Area (Vmean):   0.81 cm     PV Peak grad:   4.9 mmHg AV Area (VTI):     0.77 cm     RVOT Peak grad: 3 mmHg AV Vmax:           321.00 cm/s AV Vmean:          209.500 cm/s AV VTI:            0.658 m AV Peak Grad:      41.2 mmHg AV Mean Grad:      21.5 mmHg LVOT Vmax:         103.00 cm/s LVOT Vmean:        60.200 cm/s LVOT VTI:          0.178 m LVOT/AV VTI ratio: 0.27  AORTA Ao Root diam: 3.10 cm MITRAL VALVE                TRICUSPID VALVE MV Area (PHT): 4.21 cm     TR Peak grad:   25.2 mmHg MV Decel Time: 180 msec     TR Vmax:        251.00 cm/s MV E velocity: 136.00 cm/s                             SHUNTS                             Systemic VTI:  0.18 m                             Systemic Diam: 1.90 cm Ida Rogue MD Electronically signed by Ida Rogue MD Signature Date/Time: 11/07/2020/12:09:35 PM    Final       Management plans discussed with the patient, and Arnette and they are in agreement.  CODE STATUS:     Code Status Orders  (From admission, onward)         Start     Ordered    11/06/20 1113  Full code  Continuous        11/06/20 1114        Code Status History    Date Active  Date Inactive Code Status Order ID Comments User Context   04/01/2015 2223 04/02/2015 1817 Full Code [id number]  Lytle Butte, MD ED   02/17/2015 2044 02/20/2015 1616 Full Code [id number]  Hower, Aaron Mose, MD ED   Advance Care Planning Activity      TOTAL TIME TAKING CARE OF THIS PATIENT: 35 minutes.    Loletha Grayer M.D on 11/07/2020 at 2:10 PM  Between 7am to 6pm - Pager - [phone]  After 6pm go to [email or web address] - password [email or web address]  [facility]  CC: Primary care physician; [name], NP

## 2020-11-07 NOTE — Progress Notes (Signed)
Neurology Progress Note  Patient ID: 65 yo man with hx a fib noncompliant on xarelto and DM2 presents with inconsistent hx full-body paresthesias x1 wk and found to have a subacute R thalamic stroke on MRI brain.  Subjective: - Pt examined at bedside today, reports no current neurologic sx, no paresthesias, no new complaints  Interval data:  CTA H&N  1. Stable appearance of acute/subacute nonhemorrhagic infarct of the right thalamus. 2. Stable atrophy and moderate diffuse white matter disease. 3. 60% stenosis of the proximal right internal carotid artery at the bifurcation. 4. Atherosclerotic changes at the left carotid bifurcation and cavernous internal carotid arteries bilaterally without significant stenosis. 5. Atherosclerotic changes at the dural margin of both vertebral arteries without to 50% narrowing on the right. 6. Mild narrowing of the proximal right P1 segment. This could impact the thalamostriate perforators. 7. No other significant proximal stenosis, aneurysm, or branch vessel occlusion within the Circle of Willis. 8. Aortic Atherosclerosis (ICD10-I70.0).  CNS imaging personally reviewed; I agree with above interpretation  LDL 135 A1c 6.8  TTE  1. Left ventricular ejection fraction, by estimation, is 50 to 55%. The  left ventricle has low normal function. Septal wall hypokinesis possibly  from bundle branch block. There is mild left ventricular hypertrophy. Left  ventricular diastolic parameters  are indeterminate.  2. Right ventricular systolic function is normal. The right ventricular  size is normal.  3. Left atrial size was mildly dilated.  4. Mild mitral valve regurgitation.  5. The aortic valve was not well visualized. There is moderate  calcification of the aortic valve. Aortic valve regurgitation is mild.  Moderate aortic valve stenosis by mean gradient measures 21.5 mmHg and  aortic valve Vmax measures 3.21 m/s.  6. Poor visulalization     Exam: Vitals:   11/07/20 0739 11/07/20 1108  BP: 129/70 134/82  Pulse: 74 77  Resp: 16 19  Temp: 97.6 F (36.4 C) 97.8 F (36.6 C)  SpO2: 99% 97%   Physical Exam Gen: A&O x4, NAD HEENT: Atraumatic, normocephalic;mucous membranes moist; oropharynx clear, tongue without atrophy or fasciculations. Neck: Supple, trachea midline. Resp: CTAB, no w/r/r CV: RRR, no m/g/r; nml S1 and S2. 2+ symmetric peripheral pulses. Abd: soft/NT/ND; nabs x 4 quad Extrem: Nml bulk; no cyanosis, clubbing, or edema.  Neuro: *MS: A&O x4. Pleasant and delusional. Follows simple commands but becomes easily confused with multi-step. *Speech: fluid, nondysarthric, able to name and repeat *CN:    I: Deferred   II,III: PERRLA, VFF by confrontation, optic discs sharp   III,IV,VI: EOMI w/o nystagmus, no ptosis   V: Sensation intact from V1 to V3 to LT   VII: Eyelid closure was full.Smile symmetric.   VIII: Hearing intact to voice   IX,X: Voice normal, palate elevates symmetrically    XI: SCM/trap 5/5 bilat   XII: Tongue protrudes midline, no atrophy or fasciculations   *Motor: Normal bulk.  No tremor, rigidity or bradykinesia. No pronator drift.    Strength: Dlt Bic Tri WrE WrF FgS Gr HF KnF KnE PlF DoF    Left 5 5 5 5 5 5 5 5 5 5 5 5     Right 5 5 5 5 5 5 5 5 5 5 5 5     *Sensory: Intact to light touch, pinprick, temperature vibration throughout. Symmetric. Propioception intact bilat.No double-simultaneous extinction.  *Coordination:Finger-to-nose, heel-to-shin, rapid alternating motions were intact. *Reflexes:1+ and symmetric throughout without clonus; toes down-going bilat *Gait: deferred  NIHSS = 0   Impression: 65  yo man with hx a fib noncompliant on xarelto and DM2 presents with inconsistent hx full-body paresthesias x1 wk and found to have a subacute R thalamic stroke on MRI brain.  Recommendations:  - Continue Xarelto 20mg  qhs. OK to continue ASA 81mg  daily if for cardiac  indication (hx CAD). For secondary stroke prevention there is no additional benefit of adding antiplatelet to therapeutic anticoagulation. - Rosuvastatin changed to atorvastatin 80mg  daily; goal LDL <70 in setting of DM - Stroke education - Inpatient neurologic workup completed. I will place amb referral to neurology to arrange outpatient neurologic f/u  Neurology will not continue to actively follow but please re-engage if additional neurologic concerns arise.  Su Monks, MD [facility] [phone]  If 7pm- 7am, please page neurology on call as listed in [place].

## 2020-11-07 NOTE — Progress Notes (Signed)
IVs removed before discharge. Went over discharge instructions with patient. All questions were answered and patient stated that he understood. Patient stated that he had a red shirt and a pair of shoes. Did not see either in patients room and room was well searched. Called patients caregiver [name] and she came to get patient.

## 2020-11-07 NOTE — Evaluation (Signed)
Physical Therapy Evaluation Patient Details Name: Evan [name] MRN: [id number] DOB: 02-25-1956 Today's Date: 11/07/2020   History of Present Illness  Pt is a 65 y/o M presenting to ED from home on 11/06/20 with c/c of acute BLE numbness, dizzy, lightheaded, & HA for 1 week. MRI reveals acute/subacute nonhmorrhagic 9 mm R thalamic infarct. Pt did not receive TPA since he was outside of the window upon presentation (initially reported symptoms began at [facility]). He does not have a psychiatric diagnosis in the chart but is on risperidone as an outpatient. PMH: morbid obesity, CAD s/p PCI/s/p CABG, chronic diastolic dysfunction CHF, OSA, a-fib on anticoagulation, COVID 19 (09/23/20), DM, anginal pain, anxiety, asthma, HLD, MI, panic disorder, PTSD, peripheral venous insufficiency, diabetic retinopathy, venous statis, vertigo    Clinical Impression  Pt seen for PT evaluation with pt able to complete bed mobility with supervision with use of hospital bed features. Pt is able to ambulate 2 laps around nurses station pushing telemetry box on pole with mod I without any LOB or difficulties noted. Pt reports he ambulates with 3 wheeled walker at home. Pt doesn't not demonstrate any acute PT needs at this time. Will sign off at this time. Please re-consult if new needs arise.     Follow Up Recommendations No PT follow up    Equipment Recommendations  None recommended by PT    Recommendations for Other Services       Precautions / Restrictions Precautions Precautions: None Restrictions Weight Bearing Restrictions: No      Mobility  Bed Mobility Overal bed mobility: Needs Assistance Bed Mobility: Supine to Sit     Supine to sit: Supervision;HOB elevated     General bed mobility comments: extra time to upright trunk    Transfers Overall transfer level: Needs assistance Equipment used: None Transfers: Sit to/from Stand Sit to Stand: Modified independent (Device/Increase time)             Ambulation/Gait Ambulation/Gait assistance: Modified independent (Device/Increase time) Gait Distance (Feet): 375 Feet Assistive device:  (pushed stand that telemtery box was attached to) Gait Pattern/deviations: Decreased step length - left;Decreased step length - right;Decreased stride length        Stairs            Wheelchair Mobility    Modified Rankin (Stroke Patients Only)       Balance Overall balance assessment: Modified Independent                                           Pertinent Vitals/Pain Pain Assessment: Faces Faces Pain Scale: Hurts a little bit Pain Location: "spot in the center of my back", notes it's been occuring for the past ~7 months Pain Descriptors / Indicators:  (doesn't describe) Pain Intervention(s): Monitored during session    Home Living Family/patient expects to be discharged to:: Private residence Living Arrangements: Alone Available Help at Discharge: Personal care attendant (care attendants 2 hours/day, 7 days/week) Type of Home: Apartment Home Access: Level entry     Home Layout: One level Home Equipment:  (three wheeled walker) Additional Comments: 1 fall several weeks ago but none others    Prior Function Level of Independence: Independent with assistive device(s)         Comments: ambulatory with 3 wheeled walker, cooks, bathes & dresses himself without assistance, reports his personal care attendants assist with house cleaning & "  keep him company, will sit and talk"     Hand Dominance   Dominant Hand: Right    Extremity/Trunk Assessment   Upper Extremity Assessment Upper Extremity Assessment: Overall WFL for tasks assessed    Lower Extremity Assessment Lower Extremity Assessment: Overall WFL for tasks assessed (BLE heel to shin equal, BLE sensation (to light touch) & proprioception intact)       Communication   Communication: No difficulties  Cognition Arousal/Alertness:  Awake/alert Behavior During Therapy: WFL for tasks assessed/performed Overall Cognitive Status: Difficult to assess                                 General Comments: AxO x 4, but requires frequent gentle redirection to task at hand as pt perseverative & insistent on showing PT his friends & family on his phone who turn out to be old live streams of various celebrities.      General Comments General comments (skin integrity, edema, etc.): notes SOB after gait but SpO2 >90% on room air    Exercises     Assessment/Plan    PT Assessment Patent does not need any further PT services  PT Problem List         PT Treatment Interventions      PT Goals (Current goals can be found in the Care Plan section)  Acute Rehab PT Goals Patient Stated Goal: none stated PT Goal Formulation: With patient Time For Goal Achievement: 11/21/20    Frequency     Barriers to discharge        Co-evaluation               AM-PAC PT "6 Clicks" Mobility  Outcome Measure Help needed turning from your back to your side while in a flat bed without using bedrails?: None Help needed moving from lying on your back to sitting on the side of a flat bed without using bedrails?: None Help needed moving to and from a bed to a chair (including a wheelchair)?: None Help needed standing up from a chair using your arms (e.g., wheelchair or bedside chair)?: None Help needed to walk in hospital room?: None Help needed climbing 3-5 steps with a railing? : None 6 Click Score: 24    End of Session Equipment Utilized During Treatment: Gait belt Activity Tolerance: Patient tolerated treatment well Patient left: in chair;with chair alarm set;with call bell/phone within reach        Time: 0900-0925 PT Time Calculation (min) (ACUTE ONLY): 25 min   Charges:   PT Evaluation $PT Eval Low Complexity: 1 Low PT Treatments $Therapeutic Activity: 8-22 mins        Lavone Nian, PT,  DPT 11/07/20, 9:52 AM   Waunita Schooner 11/07/2020, 9:39 AM

## 2020-11-08 ENCOUNTER — Telehealth: Payer: Self-pay

## 2020-11-08 NOTE — Telephone Encounter (Signed)
I spoke with patient on 3/31 and informed him his procedure has been scheduled and Ms [name] is aware of the date. Also, my chart message was sent to patient explaining everything in detail.

## 2020-11-08 NOTE — Telephone Encounter (Signed)
Evan [name] LVM stated he has not been able to get in touch with his scheduler.  Wanted to know if you could call him back to discuss.  Thanks,  [place], [place]

## 2020-11-12 ENCOUNTER — Telehealth: Payer: Self-pay
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
## 2020-11-17 ENCOUNTER — Telehealth: Payer: Self-pay

## 2020-11-17 NOTE — Progress Notes (Deleted)
Chronic Care Management Pharmacy Note  11/17/2020 Name:  Evan [name] MRN:  [id number] DOB:  [birth year]  Subjective: Evan [name] is an 65 y.o. year old male who is a primary patient of [name], Henrine Screws T, NP.  The CCM team was consulted for assistance with disease management and care coordination needs.    Engaged with patient by telephone for initial visit in response to provider referral for pharmacy case management and/or care coordination services.   Consent to Services:  The patient was given information about Chronic Care Management services, agreed to services, and gave verbal consent prior to initiation of services.  Please see initial visit note for detailed documentation.   Patient Care Team: [name], NP as PCP - General (Nurse Practitioner) Minna Merritts, MD as PCP - Cardiology (Cardiology) Alisa Graff, FNP as Nurse Practitioner (Cardiology) Minna Merritts, MD as Consulting Physician (Cardiology) [name], RN as Case Manager ([place]) [name], LCSW as Social Worker (Licensed Clinical Social Worker) [name], [facility] as Pharmacist (Pharmacist)  Recent office visits:  10/06/20- [name](PCP)- blood work- referral to podiatry, start zetia 10 mg   Recent consult visits: 10/26/20-Patel(podiatry)- debridement of toenails  Hospital visits: 11/06/20- [facility] Acute CVA ischemic, Brain MRI 9 mm rt thalamic infarct, BP 188/95 on admission, supposed to follow up with [name] in 5 days and Dr. [name] in one week and neuro 4/24, neuro changed crestor to lipitor 80, CT ango =60% stenosis rt ICA, echo neg for thrombus, hold lisinopril, had psych consult risperidone bid, furosemide 80am 40 pm  Objective:  Lab Results  Component Value Date   CREATININE 1.08 11/06/2020   BUN 17 11/06/2020   GFR 98.05 09/12/2013   GFRNONAA >60 11/06/2020   GFRAA 105 07/08/2020   NA 135 11/06/2020   K 4.0 11/06/2020   CALCIUM 8.9 11/06/2020   CO2 21  (L) 11/06/2020   GLUCOSE 165 (H) 11/06/2020    Lab Results  Component Value Date/Time   HGBA1C 6.8 (H) 11/06/2020 11:24 AM   HGBA1C 6.4 10/06/2020 11:17 AM   HGBA1C 6.3 07/08/2020 10:00 AM   GFR 98.05 09/12/2013 10:35 AM   GFR 98.79 07/26/2011 12:34 PM   MICROALBUR 30 (H) 10/06/2020 11:17 AM   MICROALBUR 80 (H) 09/09/2019 10:47 AM    Last diabetic Eye exam:  Lab Results  Component Value Date/Time   HMDIABEYEEXA Retinopathy (A) 10/27/2020 12:00 AM    Last diabetic Foot exam: No results found for: HMDIABFOOTEX   Lab Results  Component Value Date   CHOL 203 (H) 11/07/2020   HDL 45 11/07/2020   LDLCALC 135 (H) 11/07/2020   LDLDIRECT 178.5 07/26/2011   TRIG 115 11/07/2020   CHOLHDL 4.5 11/07/2020    Hepatic Function Latest Ref Rng & Units 10/06/2020 08/28/2020 08/01/2020  Total Protein 6.0 - 8.5 g/dL 7.4 7.9 7.5  Albumin 3.8 - 4.8 g/dL 4.5 4.0 4.0  AST 0 - 40 IU/L 19 20 16   ALT 0 - 44 IU/L 17 16 13   Alk Phosphatase 44 - 121 IU/L 92 74 73  Total Bilirubin 0.0 - 1.2 mg/dL 0.5 0.8 1.1  Bilirubin, Direct 0.0 - 0.3 mg/dL - - -    Lab Results  Component Value Date/Time   TSH 2.567 11/06/2020 07:34 AM   TSH 2.080 10/06/2020 11:53 AM   TSH 1.560 09/09/2019 11:28 AM    CBC Latest Ref Rng & Units 11/06/2020 08/28/2020 08/01/2020  WBC 4.0 - 10.5 K/uL 8.4 10.4  8.2  Hemoglobin 13.0 - 17.0 g/dL 14.5 14.6 13.8  Hematocrit 39.0 - 52.0 % 45.6 46.0 43.5  Platelets 150 - 400 K/uL 259 261 215    Lab Results  Component Value Date/Time   VD25OH 23.8 (L) 10/06/2020 11:53 AM   VD25OH 20.6 (L) 09/09/2019 11:28 AM    Clinical ASCVD: Yes  The ASCVD Risk score Mikey Bussing DC Jr., et al., 2013) failed to calculate for the following reasons:   The patient has a prior MI or stroke diagnosis    Depression screen [facility] 2/9 10/06/2020 07/08/2020 04/08/2020  Decreased Interest 0 0 0  Down, Depressed, Hopeless 0 0 0  PHQ - 2 Score 0 0 0  Altered sleeping 2 0 0  Tired, decreased energy 0 0 0  Change in  appetite 0 0 0  Feeling bad or failure about yourself  0 0 0  Trouble concentrating 0 0 0  Moving slowly or fidgety/restless 0 0 0  Suicidal thoughts 0 0 0  PHQ-9 Score 2 0 0  Difficult doing work/chores - Not difficult at all Not difficult at all  Some recent data might be hidden   CHA2DS2/VAS Stroke Risk Points  Current as of yesterday     7 >= 2 Points: High Risk  1 - 1.99 Points: Medium Risk  0 Points: Low Risk    No Change      Details    This score determines the patient's risk of having a stroke if the  patient has atrial fibrillation.       Points Metrics  1 Has Congestive Heart Failure:  Yes    Current as of yesterday  1 Has Vascular Disease:  Yes    Current as of yesterday  1 Has Hypertension:  Yes    Current as of yesterday  1 Age:  11    Current as of yesterday  1 Has Diabetes:  Yes    Current as of yesterday  2 Had Stroke:  Yes  Had TIA:  No  Had Thromboembolism:  No    Current as of yesterday  0 Male:  No    Current as of yesterday            Social History   Tobacco Use  Smoking Status Never Smoker  Smokeless Tobacco Never Used   BP Readings from Last 3 Encounters:  11/07/20 123/65  10/06/20 136/78  08/31/20 (!) 177/96   Pulse Readings from Last 3 Encounters:  11/07/20 83  10/06/20 88  08/31/20 (!) 106   Wt Readings from Last 3 Encounters:  11/06/20 226 lb (102.5 kg)  10/06/20 238 lb (108 kg)  08/31/20 232 lb 9.6 oz (105.5 kg)   BMI Readings from Last 3 Encounters:  11/06/20 37.61 kg/m  10/06/20 42.16 kg/m  08/31/20 38.71 kg/m    Assessment/Interventions: Review of patient past medical history, allergies, medications, health status, including review of consultants reports, laboratory and other test data, was performed as part of comprehensive evaluation and provision of chronic care management services.   SDOH:  (Social Determinants of Health) assessments and interventions performed: No  SDOH Screenings   Alcohol Screen:  Not on file  Depression (PHQ2-9): Low Risk   . PHQ-2 Score: 2  Financial Resource Strain: Low Risk   . Difficulty of Paying Living Expenses: Not hard at all  Food Insecurity: No Food Insecurity  . Worried About Charity fundraiser in the Last Year: Never true  . Ran Out of Food in  the Last Year: Never true  Housing: Not on file  Physical Activity: Inactive  . Days of Exercise per Week: 0 days  . Minutes of Exercise per Session: 0 min  Social Connections: Not on file  Stress: No Stress Concern Present  . Feeling of Stress : Not at all  Tobacco Use: Low Risk   . Smoking Tobacco Use: Never Smoker  . Smokeless Tobacco Use: Never Used  Transportation Needs: Not on file    CCM Care Plan  Allergies  Allergen Reactions  . Penicillin G Hives  . Sulfa Antibiotics Hives  . Tiotropium   . Zoloft [Sertraline Hcl] Other (See Comments)    Medications Reviewed Today    Reviewed by Ruta Hinds, RN (Registered Nurse) on 11/06/20 at 1540  Med List Status: Complete  Medication Order Taking? Sig Documenting Provider Last Dose Status Informant  acetaminophen (TYLENOL) 500 MG tablet [id number] No Take 500 mg by mouth every 6 (six) hours as needed. [provider] PRN Active Self  aspirin 81 MG chewable tablet [id number] Yes Chew 81 mg by mouth daily. [provider] 11/06/2020 01:00 Active Self  Blood Glucose Monitoring Suppl (ONETOUCH VERIO) w/Device KIT [id number]  Use to check blood sugar 3 to 4 times a day and document.  Please bring to visits for review. [name] T, NP  Active Self  budesonide-formoterol (SYMBICORT) 80-4.5 MCG/ACT inhaler [id number] Yes Inhale 2 puffs into the lungs 2 (two) times daily. [name] T, NP Past Week Unknown time Active Self  cholecalciferol (VITAMIN D3) 25 MCG (1000 UNIT) tablet [id number] Yes Take 1 tablet (1,000 Units total) by mouth daily. [name] T, NP 11/06/2020 AM Active Self  Elastic Bandages & Supports (MEDICAL COMPRESSION  STOCKINGS) [place] [id number]  1 Package by Does not apply route daily. Sable Feil, PA-C  Active Self  Emollient (EUCERIN) lotion [id number]  Apply topically as needed for dry skin. Apply to legs and arms daily to help with dry skin.  Patient not taking: No sig reported   [name], Henrine Screws T, NP  Active Self  empagliflozin (JARDIANCE) 10 MG TABS tablet [id number] Yes Take 1 tablet (10 mg total) by mouth daily. [name] T, NP 11/05/2020 Unknown time Active Self  ezetimibe (ZETIA) 10 MG tablet [id number] Yes Take 1 tablet (10 mg total) by mouth daily. [name] T, NP 11/05/2020 Unknown time Active Self  furosemide (LASIX) 40 MG tablet [id number] Yes TAKE 2 TABLETS BY MOUTH ONCE EVERY MORNING AND 1 TABLET ONCE EVERY EVENING [name], [name] T, NP 11/05/2020 Unknown time Active Self  glucose blood (ONETOUCH VERIO) test strip [id number]  Use to check blood sugar 3 to 4 times a day. [name] T, NP  Active Self  lisinopril (ZESTRIL) 10 MG tablet [id number] Yes Take 1 tablet (10 mg total) by mouth daily. [name] T, NP 11/05/2020 Unknown time Active Self  metoprolol succinate (TOPROL XL) 50 MG 24 hr tablet [id number] Yes Take 1 tablet (50 mg total) by mouth daily. [name] T, NP 11/05/2020 Unknown time Active Self  nitroGLYCERIN (NITROSTAT) 0.4 MG SL tablet [id number] No Place 1 tablet (0.4 mg total) under the tongue every 5 (five) minutes as needed for chest pain. [name] T, NP PRN Active Self  Omega-3 Fatty Acids (OMEGA 3 500 PO) [id number] Yes Take 500 mg by mouth daily. [provider] 11/05/2020 Unknown time Active Self  OneTouch Delica Lancets 96G MISC [id number]  Use to check blood sugar 2-3 times a day. [name], [name] T,  NP  Active Self  pantoprazole (PROTONIX) 40 MG tablet [id number] Yes Take 1 tablet (40 mg total) by mouth daily. [name] T, NP 11/05/2020 Unknown time Active Self  risperiDONE (RISPERDAL) 0.5 MG tablet [id number] Yes Take 1 tablet (0.5 mg total) by mouth  2 (two) times daily. [name] T, NP 11/05/2020 Unknown time Active Self  rivaroxaban (XARELTO) 20 MG TABS tablet [id number] Yes Take 1 tablet (20 mg total) by mouth at bedtime. [name] T, NP 11/05/2020 Unknown time Active Self  rosuvastatin (CRESTOR) 40 MG tablet [id number] Yes Take 1 tablet (40 mg total) by mouth daily. [name] T, NP 11/05/2020 Unknown time Active Self  sharps container [id number]  1 each by Does not apply route as needed. [name], NP  Active Self  Skin Protectants, Misc. (EUCERIN) cream [id number]  Apply topically as needed for dry skin. Sable Feil, PA-C  Active Self  triamcinolone (KENALOG) 0.1 % [id number]  APPLY TO AFFECTED AREA(s) TWICE DAILY [name], NP  Active Self  Med List Note Arby Barrette, CPhT 08/08/18 0813): [facility] [phone]          Patient Active Problem List   Diagnosis Date Noted  . Acute CVA (cerebrovascular accident) ([place]) 11/06/2020  . Lab test positive for detection of COVID-19 virus 09/27/2020  . Dysphagia 08/31/2020  . Atherosclerosis of aorta ([place]) 12/04/2019  . Persistent atrial fibrillation ([place]) 09/12/2019  . CAD (coronary artery disease) 09/12/2019  . Chronic venous stasis 09/12/2019  . Hoarding disorder 09/12/2019  . Cervical spinal stenosis 09/12/2019  . Diabetic retinopathy ([place]) 09/12/2019  . COPD, mild ([place]) 09/12/2019  . Osteoporosis 09/09/2019  . History of prostate cancer 09/09/2019  . Anxiety 08/10/2019  . Obstructive sleep apnea on CPAP 08/10/2019  . Hyperlipidemia associated with type 2 diabetes mellitus ([place]) 08/10/2019  . Chronic diastolic CHF (congestive heart failure) ([place]) 01/09/2014  . Esophageal dysmotility 09/12/2013  . Type 2 diabetes mellitus with hyperlipidemia ([place]) 02/14/2012  . Obesity, morbid (more than 100 lbs over ideal weight or BMI > 40) ([place]) 07/26/2011  . OLD MYOCARDIAL INFARCTION 03/02/2010  . Essential hypertension 03/20/2009  . CORONARY  ATHEROSCLEROSIS, ARTERY BYPASS GRAFT 03/20/2009    Immunization History  Administered Date(s) Administered  . Fluad Quad(high Dose 65+) 04/08/2020  . Moderna Sars-Covid-2 Vaccination 01/18/2020, 02/16/2020, 08/28/2020    Conditions to be addressed/monitored:  Hypertension, Hyperlipidemia, Diabetes, Atrial Fibrillation, Coronary Artery Disease, GERD, COPD and Osteoporosis  There are no care plans that you recently modified to display for this patient.    Medication Assistance: None required.  Patient affirms current coverage meets needs.  Patient's preferred pharmacy is:  [address] [place], [place] [address] Phone: [phone] Fax: [phone]  Uses pill box? {Yes or If no, why not?:20788} Pt endorses ***% compliance  We discussed: Benefits of medication synchronization, packaging and delivery as well as enhanced pharmacist oversight with [employer]. Patient decided to: Continue current medication management strategy  Care Plan and Follow Up Patient Decision:  Patient agrees to Care Plan and Follow-up.  Plan: Telephone follow up appointment with care management team member scheduled for:  1 month CPA  Junita Push. Kenton Kingfisher PharmD, [facility] [phone]   Current Barriers:  . Unable to independently monitor therapeutic efficacy . Unable to achieve control of afib, HFpEF,  . Unable to maintain control of HTN, Afib . Unable to self administer  medications as prescribed . Does not adhere to prescribed medication regimen . Does not contact provider office for questions/concerns . ***  Pharmacist Clinical Goal(s):  Marland Kitchen Patient will achieve adherence to monitoring guidelines and medication adherence to achieve therapeutic efficacy . achieve control of htn as evidenced by readings . maintain control of diabetes as evidenced by lab values  . adhere to plan to optimize  therapeutic regimen for hyperlipidemia as evidenced by report of adherence to recommended medication management changes . achieve ability to self administer medications as prescribed through use of pill box or pill packaging as evidenced by patient report . adhere to prescribed medication regimen as evidenced by fill dates . contact provider office for questions/concerns as evidenced notation of same in electronic health record through collaboration with PharmD and provider.  . ***  Interventions: . 1:1 collaboration with [name], NP regarding development and update of comprehensive plan of care as evidenced by provider attestation and co-signature . Inter-disciplinary care team collaboration (see longitudinal plan of care) . Comprehensive medication review performed; medication list updated in electronic medical record  BP Readings from Last 3 Encounters:  11/07/20 123/65  10/06/20 136/78  08/31/20 (!) 177/96    Hypertension (BP goal <130/80) -Controlled -Current treatment: . toprol xl  50 mg q24 . Furosemide 80 qam , 40 mg qpm -Medications previously tried: lisinopril on hold per d/d -Current home readings: *** -Current dietary habits: *** -Current exercise habits: *** -{ACTIONS;DENIES/REPORTS:21021675::"Denies"} hypotensive/hypertensive symptoms -Educated on {CCM BP Counseling:25124} -Counseled to monitor BP at home ***, document, and provide log at future appointments -{CCMPHARMDINTERVENTION:25122}  Hyperlipidemia: (LDL goal < 70) -Uncontrolled -Current treatment: . Lipitor 80 mg qd . Asa 81 mg qd . Zetia 10 mg qd -Medications previously tried: ***  -Current dietary patterns: *** -Current exercise habits: *** -Educated on {CCM HLD Counseling:25126} -{CCMPHARMDINTERVENTION:25122}  . Atrial Fibrillation (Goal: prevent stroke and major bleeding) -Not ideally controlled -CHADSVASC: 7 -Current treatment: . Rate control: toprol xl 50 mg qd . Anticoagulation:  Xarelto 20 mg qd, asa 81 mg  -Medications previously tried: *** -Home BP and HR readings: ***  -Counseled on {CCMAFIBCOUNSELING:25120} -{CCMPHARMDINTERVENTION:25122}   Patient Goals/Self-Care Activities . Patient will:  - {pharmacypatientgoals:24919}  Follow Up Plan: {CM FOLLOW UP AUQJ:33545}

## 2020-11-18 ENCOUNTER — Telehealth: Payer: Self-pay

## 2020-11-18 NOTE — Telephone Encounter (Signed)
Spoke to pt to schedule apt and made pt aware that Evan [name] is leaving pt asked to be sent something in the mail letting him know who will be her replacemtn as he is having issues with home heath care and would like to speak to her before she leaves. Pt has multiple issues he would like to touch base with care team about.

## 2020-11-19 ENCOUNTER — Ambulatory Visit (INDEPENDENT_AMBULATORY_CARE_PROVIDER_SITE_OTHER): Payer: Medicare Other | Admitting: Pharmacist

## 2020-11-19 DIAGNOSIS — I10 Essential (primary) hypertension: Secondary | ICD-10-CM | POA: Diagnosis not present

## 2020-11-19 DIAGNOSIS — I639 Cerebral infarction, unspecified: Secondary | ICD-10-CM

## 2020-11-19 DIAGNOSIS — I4819 Other persistent atrial fibrillation: Secondary | ICD-10-CM | POA: Diagnosis not present

## 2020-11-19 NOTE — Patient Instructions (Addendum)
Visit Information  It was a pleasure speaking with you today. Thank you for letting me be part of your clinical team. Please call with any questions or concerns.   Goals Addressed            This Visit's Progress   . Keep or Improve My Strength-Stroke       Timeframe:  Short-Term Goal Priority:  High Start Date:                             Expected End Date:                       Follow Up Date 2 month follow up    - eat healthy to increase strength - increase activity or exercise time a little every week - know who to call for help if I fall    Why is this important?    Before the stroke you probably did not think much about being safe when you are up and about.   Now, it may be harder for you to get around.   It may also be easier for you to trip or fall.   It is common to have muscle weakness after a stroke. You may also feel like you cannot control an arm or leg.   It will be helpful to work with a physical therapist to get your strength and muscle control back.   It is good to stay as active as you can. Walking and stretching help you stay strong and flexible.   The physical therapist will develop an exercise program just for you.     Notes:        The patient verbalized understanding of instructions, educational materials, and care plan provided today and agreed to receive a mailed copy of patient instructions, educational materials, and care plan.   Telephone follow up appointment with pharmacy team member scheduled for: 1 month  Junita Push. [place] PharmD, BCPS Clinical Pharmacist [phone]  How to Take Your Blood Pressure Blood pressure measures how strongly your blood is pressing against the walls of your arteries. Arteries are blood vessels that carry blood from your heart throughout your body. You can take your blood pressure at home with a machine. You may need to check your blood pressure at home:  To check if you have high blood pressure  (hypertension).  To check your blood pressure over time.  To make sure your blood pressure medicine is working. Supplies needed:  Blood pressure machine, or monitor.  Dining room chair to sit in.  Table or desk.  Small notebook.  Pencil or pen. How to prepare Avoid these things for 30 minutes before checking your blood pressure:  Having drinks with caffeine in them, such as coffee or tea.  Drinking alcohol.  Eating.  Smoking.  Exercising. Do these things five minutes before checking your blood pressure:  Go to the bathroom and pee (urinate).  Sit in a dining chair. Do not sit in a soft couch or an armchair.  Be quiet. Do not talk. How to take your blood pressure Follow the instructions that came with your machine. If you have a digital blood pressure monitor, these may be the instructions: 1. Sit up straight. 2. Place your feet on the floor. Do not cross your ankles or legs. 3. Rest your left arm at the level of your heart. You may rest it on  a table, desk, or chair. 4. Pull up your shirt sleeve. 5. Wrap the blood pressure cuff around the upper part of your left arm. The cuff should be 1 inch (2.5 cm) above your elbow. It is best to wrap the cuff around bare skin. 6. Fit the cuff snugly around your arm. You should be able to place only one finger between the cuff and your arm. 7. Place the cord so that it rests in the bend of your elbow. 8. Press the power button. 9. Sit quietly while the cuff fills with air and loses air. 10. Write down the numbers on the screen. 11. Wait 2-3 minutes and then repeat steps 1-10.   What do the numbers mean? Two numbers make up your blood pressure. The first number is called systolic pressure. The second is called diastolic pressure. An example of a blood pressure reading is "120 over 80" (or 120/80). If you are an adult and do not have a medical condition, use this guide to find out if your blood pressure is normal: Normal  First  number: below 120.  Second number: below 80. Elevated  First number: 120-129.  Second number: below 80. Hypertension stage 1  First number: 130-139.  Second number: 80-89. Hypertension stage 2  First number: 140 or above.  Second number: 71 or above. Your blood pressure is above normal even if only the top or bottom number is above normal. Follow these instructions at home:  Check your blood pressure as often as your doctor tells you to.  Check your blood pressure at the same time every day.  Take your monitor to your next doctor's appointment. Your doctor will: ? Make sure you are using it correctly. ? Make sure it is working right.  Make sure you understand what your blood pressure numbers should be.  Tell your doctor if your medicine is causing side effects.  Keep all follow-up visits as told by your doctor. This is important. General tips:  You will need a blood pressure machine, or monitor. Your doctor can suggest a monitor. You can buy one at a drugstore or online. When choosing one: ? Choose one with an arm cuff. ? Choose one that wraps around your upper arm. Only one finger should fit between your arm and the cuff. ? Do not choose one that measures your blood pressure from your wrist or finger. Where to find more information American Heart Association: www.heart.org Contact a doctor if:  Your blood pressure keeps being high. Get help right away if:  Your first blood pressure number is higher than 180.  Your second blood pressure number is higher than 120. Summary  Check your blood pressure at the same time every day.  Avoid caffeine, alcohol, smoking, and exercise for 30 minutes before checking your blood pressure.  Make sure you understand what your blood pressure numbers should be. This information is not intended to replace advice given to you by your health care provider. Make sure you discuss any questions you have with your health care  provider. Document Revised: 07/18/2019 Document Reviewed: 07/18/2019 Elsevier Patient Education  2021 [place].  How to Take Your Blood Pressure Blood pressure measures how strongly your blood is pressing against the walls of your arteries. Arteries are blood vessels that carry blood from your heart throughout your body. You can take your blood pressure at home with a machine. You may need to check your blood pressure at home:  To check if you have high  blood pressure (hypertension).  To check your blood pressure over time.  To make sure your blood pressure medicine is working. Supplies needed:  Blood pressure machine, or monitor.  Dining room chair to sit in.  Table or desk.  Small notebook.  Pencil or pen. How to prepare Avoid these things for 30 minutes before checking your blood pressure:  Having drinks with caffeine in them, such as coffee or tea.  Drinking alcohol.  Eating.  Smoking.  Exercising. Do these things five minutes before checking your blood pressure:  Go to the bathroom and pee (urinate).  Sit in a dining chair. Do not sit in a soft couch or an armchair.  Be quiet. Do not talk. How to take your blood pressure Follow the instructions that came with your machine. If you have a digital blood pressure monitor, these may be the instructions: 12. Sit up straight. 13. Place your feet on the floor. Do not cross your ankles or legs. 14. Rest your left arm at the level of your heart. You may rest it on a table, desk, or chair. 15. Pull up your shirt sleeve. 16. Wrap the blood pressure cuff around the upper part of your left arm. The cuff should be 1 inch (2.5 cm) above your elbow. It is best to wrap the cuff around bare skin. 17. Fit the cuff snugly around your arm. You should be able to place only one finger between the cuff and your arm. 18. Place the cord so that it rests in the bend of your elbow. 19. Press the power button. 20. Sit quietly while  the cuff fills with air and loses air. 21. Write down the numbers on the screen. 22. Wait 2-3 minutes and then repeat steps 1-10.   What do the numbers mean? Two numbers make up your blood pressure. The first number is called systolic pressure. The second is called diastolic pressure. An example of a blood pressure reading is "120 over 80" (or 120/80). If you are an adult and do not have a medical condition, use this guide to find out if your blood pressure is normal: Normal  First number: below 120.  Second number: below 80. Elevated  First number: 120-129.  Second number: below 80. Hypertension stage 1  First number: 130-139.  Second number: 80-89. Hypertension stage 2  First number: 140 or above.  Second number: 49 or above. Your blood pressure is above normal even if only the top or bottom number is above normal. Follow these instructions at home:  Check your blood pressure as often as your doctor tells you to.  Check your blood pressure at the same time every day.  Take your monitor to your next doctor's appointment. Your doctor will: ? Make sure you are using it correctly. ? Make sure it is working right.  Make sure you understand what your blood pressure numbers should be.  Tell your doctor if your medicine is causing side effects.  Keep all follow-up visits as told by your doctor. This is important. General tips:  You will need a blood pressure machine, or monitor. Your doctor can suggest a monitor. You can buy one at a drugstore or online. When choosing one: ? Choose one with an arm cuff. ? Choose one that wraps around your upper arm. Only one finger should fit between your arm and the cuff. ? Do not choose one that measures your blood pressure from your wrist or finger. Where to find more information American Heart  Association: www.heart.org Contact a doctor if:  Your blood pressure keeps being high. Get help right away if:  Your first blood pressure  number is higher than 180.  Your second blood pressure number is higher than 120. Summary  Check your blood pressure at the same time every day.  Avoid caffeine, alcohol, smoking, and exercise for 30 minutes before checking your blood pressure.  Make sure you understand what your blood pressure numbers should be. This information is not intended to replace advice given to you by your health care provider. Make sure you discuss any questions you have with your health care provider. Document Revised: 07/18/2019 Document Reviewed: 07/18/2019 Elsevier Patient Education  2021 [employer].

## 2020-11-19 NOTE — Progress Notes (Signed)
Chronic Care Management Pharmacy Note  11/19/2020 Name:  Evan [name] MRN:  [id number] DOB:  Apr 28, 1956  Subjective: Evan [name] is an 65 y.o. year old male who is a primary patient of [name], Henrine Screws T, NP.  The CCM team was consulted for assistance with disease management and care coordination needs.    Engaged with patient by telephone for follow up visit in response to provider referral for pharmacy case management and/or care coordination services.   Consent to Services:  The patient was given information about Chronic Care Management services, agreed to services, and gave verbal consent prior to initiation of services.  Please see initial visit note for detailed documentation.   Patient Care Team: [name], NP as PCP - General (Nurse Practitioner) Minna Merritts, MD as PCP - Cardiology (Cardiology) Alisa Graff, FNP as Nurse Practitioner (Cardiology) Minna Merritts, MD as Consulting Physician (Cardiology) [name], RN as Case Manager ([place]) [name], LCSW as Social Worker (Licensed Clinical Social Worker) [name], [facility] as Pharmacist (Pharmacist)  Recent office visits:  10/06/20- [name](PCP)- blood work- referral to podiatry, start zetia 10 mg   Recent consult visits: 10/26/20-Patel(podiatry)- debridement of toenails  Hospital visits: 11/06/20- [facility] Acute CVA ischemic, Brain MRI 9 mm rt thalamic infarct, BP 188/95 on admission, supposed to follow up with [name] in 5 days and Dr. [name] in one week and neuro 4/24, neuro changed crestor to lipitor 80, CT ango =60% stenosis rt ICA, echo neg for thrombus, hold lisinopril, had psych consult risperidone bid, furosemide 80am 40 pm  Objective:  Lab Results  Component Value Date   CREATININE 1.08 11/06/2020   BUN 17 11/06/2020   GFR 98.05 09/12/2013   GFRNONAA >60 11/06/2020   GFRAA 105 07/08/2020   NA 135 11/06/2020   K 4.0 11/06/2020   CALCIUM 8.9 11/06/2020   CO2 21  (L) 11/06/2020   GLUCOSE 165 (H) 11/06/2020    Lab Results  Component Value Date/Time   HGBA1C 6.8 (H) 11/06/2020 11:24 AM   HGBA1C 6.4 10/06/2020 11:17 AM   HGBA1C 6.3 07/08/2020 10:00 AM   GFR 98.05 09/12/2013 10:35 AM   GFR 98.79 07/26/2011 12:34 PM   MICROALBUR 30 (H) 10/06/2020 11:17 AM   MICROALBUR 80 (H) 09/09/2019 10:47 AM    Last diabetic Eye exam:  Lab Results  Component Value Date/Time   HMDIABEYEEXA Retinopathy (A) 10/27/2020 12:00 AM    Last diabetic Foot exam: No results found for: HMDIABFOOTEX   Lab Results  Component Value Date   CHOL 203 (H) 11/07/2020   HDL 45 11/07/2020   LDLCALC 135 (H) 11/07/2020   LDLDIRECT 178.5 07/26/2011   TRIG 115 11/07/2020   CHOLHDL 4.5 11/07/2020    Hepatic Function Latest Ref Rng & Units 10/06/2020 08/28/2020 08/01/2020  Total Protein 6.0 - 8.5 g/dL 7.4 7.9 7.5  Albumin 3.8 - 4.8 g/dL 4.5 4.0 4.0  AST 0 - 40 IU/L _0 ALT 0 - 44 IU/L _1 Alk Phosphatase 44 - 121 IU/L 92 74 73  Total Bilirubin 0.0 - 1.2 mg/dL 0.5 0.8 1.1  Bilirubin, Direct 0.0 - 0.3 mg/dL - - -    Lab Results  Component Value Date/Time   TSH 2.567 11/06/2020 07:34 AM   TSH 2.080 10/06/2020 11:53 AM   TSH 1.560 09/09/2019 11:28 AM    CBC Latest Ref Rng & Units 11/06/2020 08/28/2020 08/01/2020  WBC 4.0 - 10.5 K/uL 8.4  10.4 8.2  Hemoglobin 13.0 - 17.0 g/dL 14.5 14.6 13.8  Hematocrit 39.0 - 52.0 % 45.6 46.0 43.5  Platelets 150 - 400 K/uL 259 261 215    Lab Results  Component Value Date/Time   VD25OH 23.8 (L) 10/06/2020 11:53 AM   VD25OH 20.6 (L) 09/09/2019 11:28 AM    Clinical ASCVD: Yes  The ASCVD Risk score Mikey Bussing DC Jr., et al., 2013) failed to calculate for the following reasons:   The patient has a prior MI or stroke diagnosis    Depression screen [facility] 2/9 10/06/2020 07/08/2020 04/08/2020  Decreased Interest 0 0 0  Down, Depressed, Hopeless 0 0 0  PHQ - 2 Score 0 0 0  Altered sleeping 2 0 0  Tired, decreased energy 0 0 0  Change in  appetite 0 0 0  Feeling bad or failure about yourself  0 0 0  Trouble concentrating 0 0 0  Moving slowly or fidgety/restless 0 0 0  Suicidal thoughts 0 0 0  PHQ-9 Score 2 0 0  Difficult doing work/chores - Not difficult at all Not difficult at all  Some recent data might be hidden   CHA2DS2/VAS Stroke Risk Points  Current as of yesterday     7 >= 2 Points: High Risk  1 - 1.99 Points: Medium Risk  0 Points: Low Risk    No Change      Details    This score determines the patient's risk of having a stroke if the  patient has atrial fibrillation.       Points Metrics  1 Has Congestive Heart Failure:  Yes    Current as of yesterday  1 Has Vascular Disease:  Yes    Current as of yesterday  1 Has Hypertension:  Yes    Current as of yesterday  1 Age:  74    Current as of yesterday  1 Has Diabetes:  Yes    Current as of yesterday  2 Had Stroke:  Yes  Had TIA:  No  Had Thromboembolism:  No    Current as of yesterday  0 Male:  No    Current as of yesterday            Social History   Tobacco Use  Smoking Status Never Smoker  Smokeless Tobacco Never Used   BP Readings from Last 3 Encounters:  11/07/20 123/65  10/06/20 136/78  08/31/20 (!) 177/96   Pulse Readings from Last 3 Encounters:  11/07/20 83  10/06/20 88  08/31/20 (!) 106   Wt Readings from Last 3 Encounters:  11/06/20 226 lb (102.5 kg)  10/06/20 238 lb (108 kg)  08/31/20 232 lb 9.6 oz (105.5 kg)   BMI Readings from Last 3 Encounters:  11/06/20 37.61 kg/m  10/06/20 42.16 kg/m  08/31/20 38.71 kg/m    Assessment/Interventions: Review of patient past medical history, allergies, medications, health status, including review of consultants reports, laboratory and other test data, was performed as part of comprehensive evaluation and provision of chronic care management services.   SDOH:  (Social Determinants of Health) assessments and interventions performed: No  SDOH Screenings   Alcohol Screen:  Not on file  Depression (PHQ2-9): Low Risk   . PHQ-2 Score: 2  Financial Resource Strain: Low Risk   . Difficulty of Paying Living Expenses: Not hard at all  Food Insecurity: No Food Insecurity  . Worried About Charity fundraiser in the Last Year: Never true  . Ran Out of Food  in the Last Year: Never true  Housing: Not on file  Physical Activity: Inactive  . Days of Exercise per Week: 0 days  . Minutes of Exercise per Session: 0 min  Social Connections: Not on file  Stress: No Stress Concern Present  . Feeling of Stress : Not at all  Tobacco Use: Low Risk   . Smoking Tobacco Use: Never Smoker  . Smokeless Tobacco Use: Never Used  Transportation Needs: Not on file     Immunization History  Administered Date(s) Administered  . Fluad Quad(high Dose 65+) 04/08/2020  . Moderna Sars-Covid-2 Vaccination 01/18/2020, 02/16/2020, 08/28/2020    Conditions to be addressed/monitored:  Hypertension, Hyperlipidemia, Diabetes, Atrial Fibrillation, Coronary Artery Disease, GERD, COPD and Osteoporosis  Care Plan : CCm Pharmacy Care Plan  Updates made by [name], [place] since 11/19/2020 12:00 AM    Problem: DM, HTN, AFIB, CAD, GERD , HLD, COPD   Priority: High    Long-Range Goal: Disease Management   This Visit's Progress: Not on track  Recent Progress: On track  Priority: High  Note:    Current Barriers:  . Unable to independently monitor therapeutic efficacy . Unable to achieve control of afib, HFpEF,  . Unable to maintain control of HTN, Afib . Unable to self administer medications as prescribed . Does not adhere to prescribed medication regimen . Does not contact provider office for questions/concerns .   Pharmacist Clinical Goal(s):  Marland Kitchen Patient will achieve adherence to monitoring guidelines and medication adherence to achieve therapeutic efficacy . achieve control of htn as evidenced by readings . maintain control of diabetes as evidenced by lab values  . adhere to plan  to optimize therapeutic regimen for hyperlipidemia as evidenced by report of adherence to recommended medication management changes . achieve ability to self administer medications as prescribed through use of pill box or pill packaging as evidenced by patient report . adhere to prescribed medication regimen as evidenced by fill dates . contact provider office for questions/concerns as evidenced notation of same in electronic health record through collaboration with PharmD and provider.  .   Interventions: . 1:1 collaboration with [name], NP regarding development and update of comprehensive plan of care as evidenced by provider attestation and co-signature . Inter-disciplinary care team collaboration (see longitudinal plan of care) . Comprehensive medication review performed; medication list updated in electronic medical record  BP Readings from Last 3 Encounters:  11/07/20 123/65  10/06/20 136/78  08/31/20 (!) 177/96    Hypertension (BP goal <130/80) -Controlled -Current treatment: . toprol xl  50 mg q24 . Furosemide 80 qam , 40 mg qpm -Medications previously tried: lisinopril on hold per d/c instructions -Current home readings: 142/89,  -Denies hypotensive/hypertensive symptoms -Educated on BP goals and benefits of medications for prevention of heart attack, stroke and kidney damage; Daily salt intake goal < 2300 mg; Importance of home blood pressure monitoring; -Counseled to monitor BP at home daily, document, and provide log at future appointments -Counseled on diet and exercise extensively Recommended to continue current medication. Unable to determine if patient is taking lisinopril.  Patient states the pharmacy adjusted his pill packaging. Spoke with Leafy Ro at [place] who states lisinopril was in his previous packaging filled 10/28/20 . This was already dispensed to patient and they did not adjust.  Hyperlipidemia: (LDL goal < 70) -Uncontrolled -Current  treatment: . Lipitor 80 mg qd . Asa 81 mg qd . Zetia 10 mg qd -Medications previously tried: Crestor -Current dietary patterns: reports  having a [place] sandwich for lunch today -Current exercise habits: is no longer walking but is doing a virtual exercise leg routine -Educated on Benefits of statin for ASCVD risk reduction; Importance of limiting foods high in cholesterol; Exercise goal of 150 minutes per week; -Counseled on diet and exercise extensively Recommended to continue current medication. Recommend PCSK-9 if LDL remains elevated. Patient compliance with statin difficult to gauge. Counseled on not taking atorvastatin and crestor. Patient denies taking both. Per [facility] Crestor was not removed from packaging- new RX sent to patient for atorvastatin.  . Atrial Fibrillation (Goal: prevent stroke and major bleeding) -Not ideally controlled -CHADSVASC: 7 -Current treatment: . Rate control: toprol xl 50 mg qd . Anticoagulation: Xarelto 20 mg qd, asa 81 mg  -Medications previously tried: NA -Home BP and HR readings: 149/89  -Counseled on increased risk of stroke due to Afib and benefits of anticoagulation for stroke prevention; importance of adherence to anticoagulant exactly as prescribed; bleeding risk associated with Xarelto and importance of self-monitoring for signs/symptoms of bleeding; seeking medical attention after a head injury or if there is blood in the urine/stool; -Counseled on diet and exercise extensively Recommended to continue current medication  Lab Results  Component Value Date   HGBA1C 6.8 (H) 11/06/2020    Diabetes (A1c goal <7%) -Controlled -Current medications:  Jardiance 10 mg qd -Medications previously tried: NA -Current home glucose readings . fasting glucose: 120 yesterday  -Denies hypoglycemic/hyperglycemic symptoms -Current exercise: Doing virtual leg exercises -Educated on A1c and blood sugar goals; Complications of diabetes including  kidney damage, retinal damage, and cardiovascular disease; Exercise goal of 150 minutes per week; Benefits of weight loss; Prevention and management of hypoglycemic episodes; Benefits of routine self-monitoring of blood sugar; -Counseled to check feet daily and get yearly eye exams -Recommended to continue current medication Educated on the importance of checking BG every day. Encouraged patient to use the palm of his hand as an alternate test site when his fingers are sore.   Patient Goals/Self-Care Activities . Patient will:  - take medications as prescribed focus on medication adherence by utilizing pill packets check glucose daily, document, and provide at future appointments check blood pressure daily, document, and provide at future appointments target a minimum of 150 minutes of moderate intensity exercise weekly engage in dietary modifications by reducing sodium and fast food consumption  Follow Up Plan: Telephone follow up appointment with care management team member scheduled for:        Medication Assistance: None required.  Patient affirms current coverage meets needs.  Patient's preferred pharmacy is:  [facility], [place] [address] Phone: [phone] Fax: [phone]  Uses pill box? No - uses pill packaging and delivery from [facility] endorses 90% compliance  We discussed: Benefits of medication synchronization, packaging and delivery as well as enhanced pharmacist oversight with [employer]. Patient decided to: Continue current medication management strategy  Care Plan and Follow Up Patient Decision:  Patient agrees to Care Plan and Follow-up.  Plan: Telephone follow up appointment with care management team member scheduled for:  1 month CPA  Junita Push. Kenton Kingfisher PharmD, [place] [facility] [phone]

## 2020-11-23 ENCOUNTER — Other Ambulatory Visit: Payer: Self-pay | Admitting: Nurse Practitioner

## 2020-11-23 MED ORDER — ASPIRIN 81 MG PO CHEW: 81.0000 mg | CHEWABLE_TABLET | Freq: Every day | ORAL | 4 refills | Status: DC

## 2020-11-23 NOTE — Telephone Encounter (Signed)
Requested medication (s) are due for refill today: no  Requested medication (s) are on the active medication list: yes  Last refill: 10/28/2020  Future visit scheduled: yes  Notes to clinic:  medication requested states it has been d/c Review for continued use and refill    Requested Prescriptions  Pending Prescriptions Disp Refills   ASPIR-LOW 81 MG EC tablet [Pharmacy Med Name: ASPIR-LOW 81 MG DR TAB] 90 tablet 3    Sig: TAKE 1 TABLET BY MOUTH ONCE DAILY      Analgesics:  NSAIDS - aspirin Passed - 11/23/2020  2:12 PM      Passed - Patient is not pregnant      Passed - Valid encounter within last 12 months    Recent Outpatient Visits           1 month ago Hypertension associated with diabetes ([place])   [place], [name] T, NP   1 month ago Lab test positive for detection of COVID-19 virus   [employer], [place] T, NP   4 months ago Type 2 diabetes mellitus with proteinuria ([place])   [place], [name] T, NP   7 months ago Type 2 diabetes mellitus with proteinuria ([place])   [place], [name] T, NP   11 months ago Type 2 diabetes mellitus with proteinuria ([place])   [place], [name], NP       Future Appointments             In 1 week [name], [name], NP [facility] [place]   In 1 month [place], Duryea T, NP [facility]   In 2 months  [facility]

## 2020-11-23 NOTE — Telephone Encounter (Signed)
Pt scheduled 4/29

## 2020-11-25 ENCOUNTER — Other Ambulatory Visit: Payer: Self-pay

## 2020-11-25 ENCOUNTER — Encounter: Payer: Self-pay | Admitting: Gastroenterology

## 2020-11-25 ENCOUNTER — Ambulatory Visit
Admission: RE | Admit: 2020-11-25 | Discharge: 2020-11-25 | Disposition: A | Discharge status: Home / Self Care | Payer: Medicare Other | Attending: Gastroenterology | Admitting: Gastroenterology

## 2020-11-25 ENCOUNTER — Ambulatory Visit: Payer: Medicare Other | Admitting: Anesthesiology

## 2020-11-25 ENCOUNTER — Telehealth: Payer: Self-pay

## 2020-11-25 ENCOUNTER — Encounter
Admission: RE | Disposition: A | Discharge status: Home / Self Care | Payer: Self-pay | Source: Home / Self Care | Attending: Gastroenterology

## 2020-11-25 DIAGNOSIS — Z7982 Long term (current) use of aspirin: Secondary | ICD-10-CM | POA: Insufficient documentation

## 2020-11-25 DIAGNOSIS — Z955 Presence of coronary angioplasty implant and graft: Secondary | ICD-10-CM | POA: Diagnosis not present

## 2020-11-25 DIAGNOSIS — I2510 Atherosclerotic heart disease of native coronary artery without angina pectoris: Secondary | ICD-10-CM | POA: Insufficient documentation

## 2020-11-25 DIAGNOSIS — Z880 Allergy status to penicillin: Secondary | ICD-10-CM | POA: Diagnosis not present

## 2020-11-25 DIAGNOSIS — I509 Heart failure, unspecified: Secondary | ICD-10-CM | POA: Insufficient documentation

## 2020-11-25 DIAGNOSIS — E785 Hyperlipidemia, unspecified: Secondary | ICD-10-CM | POA: Diagnosis not present

## 2020-11-25 DIAGNOSIS — I110 Hypertensive heart disease with heart failure: Secondary | ICD-10-CM | POA: Diagnosis not present

## 2020-11-25 DIAGNOSIS — Z79899 Other long term (current) drug therapy: Secondary | ICD-10-CM | POA: Diagnosis not present

## 2020-11-25 DIAGNOSIS — Z7951 Long term (current) use of inhaled steroids: Secondary | ICD-10-CM | POA: Insufficient documentation

## 2020-11-25 DIAGNOSIS — Z888 Allergy status to other drugs, medicaments and biological substances status: Secondary | ICD-10-CM | POA: Insufficient documentation

## 2020-11-25 DIAGNOSIS — I252 Old myocardial infarction: Secondary | ICD-10-CM | POA: Insufficient documentation

## 2020-11-25 DIAGNOSIS — R1310 Dysphagia, unspecified: Secondary | ICD-10-CM | POA: Insufficient documentation

## 2020-11-25 DIAGNOSIS — E1142 Type 2 diabetes mellitus with diabetic polyneuropathy: Secondary | ICD-10-CM | POA: Diagnosis not present

## 2020-11-25 DIAGNOSIS — E669 Obesity, unspecified: Secondary | ICD-10-CM | POA: Insufficient documentation

## 2020-11-25 DIAGNOSIS — Z8249 Family history of ischemic heart disease and other diseases of the circulatory system: Secondary | ICD-10-CM | POA: Insufficient documentation

## 2020-11-25 DIAGNOSIS — Z8673 Personal history of transient ischemic attack (TIA), and cerebral infarction without residual deficits: Secondary | ICD-10-CM | POA: Diagnosis not present

## 2020-11-25 DIAGNOSIS — Z951 Presence of aortocoronary bypass graft: Secondary | ICD-10-CM | POA: Diagnosis not present

## 2020-11-25 DIAGNOSIS — Z882 Allergy status to sulfonamides status: Secondary | ICD-10-CM | POA: Diagnosis not present

## 2020-11-25 DIAGNOSIS — Z6839 Body mass index (BMI) 39.0-39.9, adult: Secondary | ICD-10-CM | POA: Insufficient documentation

## 2020-11-25 LAB — GLUCOSE, CAPILLARY: Glucose-Capillary: 126 mg/dL — ABNORMAL HIGH (ref 70–99)

## 2020-11-25 SURGERY — COLONOSCOPY WITH PROPOFOL
Anesthesia: General

## 2020-11-25 MED ORDER — NA SULFATE-K SULFATE-MG SULF 17.5-3.13-1.6 GM/177ML PO SOLN: 1.0000 | Freq: Once | ORAL | 0 refills | Status: AC

## 2020-11-25 MED ORDER — PROPOFOL 500 MG/50ML IV EMUL
INTRAVENOUS | Status: AC
  Filled 2020-11-25: qty 50

## 2020-11-25 MED ORDER — LIDOCAINE HCL (PF) 2 % IJ SOLN
INTRAMUSCULAR | Status: AC
  Filled 2020-11-25: qty 5

## 2020-11-25 NOTE — OR Nursing (Signed)
States his BM is still medium brown. Cannot see the toilet through results. States there is still solid pieces. Dr. Vicente Males spoke with patient with plans to reprep and reshedule to tomorrow. Spoke with caregiver to confirm ride for tomorrow.

## 2020-11-25 NOTE — Anesthesia Preprocedure Evaluation (Deleted)
Anesthesia Evaluation    Airway        Dental   Pulmonary           Cardiovascular hypertension, + Valvular Problems/Murmurs AS   1. Left ventricular ejection fraction, by estimation, is 50 to 55%. The  left ventricle has low normal function. Septal wall hypokinesis possibly  from bundle branch block. There is mild left ventricular hypertrophy. Left  ventricular diastolic parameters  are indeterminate.  2. Right ventricular systolic function is normal. The right ventricular  size is normal.  3. Left atrial size was mildly dilated.  4. Mild mitral valve regurgitation.  5. The aortic valve was not well visualized. There is moderate  calcification of the aortic valve. Aortic valve regurgitation is mild.  Moderate aortic valve stenosis by mean gradient measures 21.5 mmHg and  aortic valve Vmax measures 3.21 m/s.  6. Poor visulalization    Neuro/Psych    GI/Hepatic   Endo/Other  diabetes  Renal/GU      Musculoskeletal   Abdominal   Peds  Hematology   Anesthesia Other Findings   Reproductive/Obstetrics                             Anesthesia Physical Anesthesia Plan Anesthesia Quick Evaluation

## 2020-11-25 NOTE — Progress Notes (Signed)
Spoke with caregiver [name] and she states patient did not start the clear liquid diet yesterday. He ate solid foods. I explained instructions to patient and the other caregiver on yesterday and both verbalized understanding. Different prep will be sent in today for procedure tomorrow.

## 2020-11-26 ENCOUNTER — Other Ambulatory Visit: Payer: Self-pay | Admitting: Nurse Practitioner

## 2020-11-26 ENCOUNTER — Encounter
Admission: RE | Disposition: A | Discharge status: Home / Self Care | Payer: Self-pay | Source: Ambulatory Visit | Attending: Gastroenterology

## 2020-11-26 ENCOUNTER — Ambulatory Visit: Payer: Medicare Other | Admitting: Anesthesiology

## 2020-11-26 ENCOUNTER — Ambulatory Visit
Admission: RE | Admit: 2020-11-26 | Discharge: 2020-11-26 | Disposition: A | Discharge status: Home / Self Care | Payer: Medicare Other | Source: Ambulatory Visit | Attending: Gastroenterology | Admitting: Gastroenterology

## 2020-11-26 ENCOUNTER — Telehealth: Payer: Self-pay | Admitting: Nurse Practitioner

## 2020-11-26 ENCOUNTER — Other Ambulatory Visit: Payer: Self-pay

## 2020-11-26 ENCOUNTER — Encounter: Payer: Self-pay | Admitting: Gastroenterology

## 2020-11-26 DIAGNOSIS — Z8673 Personal history of transient ischemic attack (TIA), and cerebral infarction without residual deficits: Secondary | ICD-10-CM | POA: Diagnosis not present

## 2020-11-26 DIAGNOSIS — Z79899 Other long term (current) drug therapy: Secondary | ICD-10-CM | POA: Diagnosis not present

## 2020-11-26 DIAGNOSIS — K649 Unspecified hemorrhoids: Secondary | ICD-10-CM | POA: Diagnosis not present

## 2020-11-26 DIAGNOSIS — D120 Benign neoplasm of cecum: Secondary | ICD-10-CM | POA: Diagnosis not present

## 2020-11-26 DIAGNOSIS — Z880 Allergy status to penicillin: Secondary | ICD-10-CM | POA: Insufficient documentation

## 2020-11-26 DIAGNOSIS — Z7984 Long term (current) use of oral hypoglycemic drugs: Secondary | ICD-10-CM | POA: Insufficient documentation

## 2020-11-26 DIAGNOSIS — Z882 Allergy status to sulfonamides status: Secondary | ICD-10-CM | POA: Diagnosis not present

## 2020-11-26 DIAGNOSIS — Z955 Presence of coronary angioplasty implant and graft: Secondary | ICD-10-CM | POA: Insufficient documentation

## 2020-11-26 DIAGNOSIS — Z8616 Personal history of COVID-19: Secondary | ICD-10-CM | POA: Diagnosis not present

## 2020-11-26 DIAGNOSIS — R1310 Dysphagia, unspecified: Secondary | ICD-10-CM | POA: Diagnosis not present

## 2020-11-26 DIAGNOSIS — G4733 Obstructive sleep apnea (adult) (pediatric): Secondary | ICD-10-CM | POA: Diagnosis not present

## 2020-11-26 DIAGNOSIS — Z888 Allergy status to other drugs, medicaments and biological substances status: Secondary | ICD-10-CM | POA: Insufficient documentation

## 2020-11-26 DIAGNOSIS — E785 Hyperlipidemia, unspecified: Secondary | ICD-10-CM | POA: Diagnosis not present

## 2020-11-26 DIAGNOSIS — Z7951 Long term (current) use of inhaled steroids: Secondary | ICD-10-CM | POA: Diagnosis not present

## 2020-11-26 DIAGNOSIS — Z951 Presence of aortocoronary bypass graft: Secondary | ICD-10-CM | POA: Diagnosis not present

## 2020-11-26 DIAGNOSIS — K635 Polyp of colon: Secondary | ICD-10-CM

## 2020-11-26 DIAGNOSIS — I5032 Chronic diastolic (congestive) heart failure: Secondary | ICD-10-CM | POA: Diagnosis not present

## 2020-11-26 DIAGNOSIS — Z1211 Encounter for screening for malignant neoplasm of colon: Secondary | ICD-10-CM | POA: Insufficient documentation

## 2020-11-26 DIAGNOSIS — Z7982 Long term (current) use of aspirin: Secondary | ICD-10-CM | POA: Insufficient documentation

## 2020-11-26 DIAGNOSIS — K640 First degree hemorrhoids: Secondary | ICD-10-CM | POA: Insufficient documentation

## 2020-11-26 DIAGNOSIS — I110 Hypertensive heart disease with heart failure: Secondary | ICD-10-CM | POA: Diagnosis not present

## 2020-11-26 DIAGNOSIS — J449 Chronic obstructive pulmonary disease, unspecified: Secondary | ICD-10-CM | POA: Diagnosis not present

## 2020-11-26 HISTORY — PX: COLONOSCOPY WITH PROPOFOL: SHX5780

## 2020-11-26 HISTORY — DX: Cerebral infarction, unspecified: I63.9

## 2020-11-26 HISTORY — DX: COVID-19: U07.1

## 2020-11-26 HISTORY — PX: ESOPHAGOGASTRODUODENOSCOPY (EGD) WITH PROPOFOL: SHX5813

## 2020-11-26 LAB — GLUCOSE, CAPILLARY: Glucose-Capillary: 139 mg/dL — ABNORMAL HIGH (ref 70–99)

## 2020-11-26 SURGERY — COLONOSCOPY WITH PROPOFOL
Anesthesia: General

## 2020-11-26 MED ORDER — ATORVASTATIN CALCIUM 80 MG PO TABS: 80.0000 mg | ORAL_TABLET | Freq: Every day | ORAL | 4 refills | Status: DC

## 2020-11-26 MED ORDER — LIDOCAINE HCL (CARDIAC) PF 100 MG/5ML IV SOSY
PREFILLED_SYRINGE | INTRAVENOUS | Status: DC | PRN
  Administered 2020-11-26: 100 mg via INTRAVENOUS

## 2020-11-26 MED ORDER — PROPOFOL 10 MG/ML IV BOLUS
INTRAVENOUS | Status: AC
  Filled 2020-11-26: qty 20

## 2020-11-26 MED ORDER — SODIUM CHLORIDE 0.9 % IV SOLN: INTRAVENOUS | Status: DC

## 2020-11-26 MED ORDER — PROPOFOL 500 MG/50ML IV EMUL
INTRAVENOUS | Status: DC | PRN
  Administered 2020-11-26: 150 ug/kg/min via INTRAVENOUS

## 2020-11-26 MED ORDER — PROPOFOL 10 MG/ML IV BOLUS
INTRAVENOUS | Status: DC | PRN
  Administered 2020-11-26: 100 mg via INTRAVENOUS

## 2020-11-26 NOTE — Transfer of Care (Signed)
Immediate Anesthesia Transfer of Care Note   Patient: Evan [name]  Procedure(s) Performed: COLONOSCOPY WITH PROPOFOL (N/A ) ESOPHAGOGASTRODUODENOSCOPY (EGD) WITH PROPOFOL  Patient Location: Endoscopy Unit  Anesthesia Type:General  Level of Consciousness: drowsy  Airway & Oxygen Therapy: Patient Spontanous Breathing  Post-op Assessment: Report given to RN and Post -op Vital signs reviewed and stable  Post vital signs: Reviewed and stable  Last Vitals:  Vitals Value Taken Time  BP    Temp    Pulse 96 11/26/20 1058  Resp 25 11/26/20 1058  SpO2 93 % 11/26/20 1058  Vitals shown include unvalidated device data.  Last Pain:  Vitals:   11/26/20 1004  TempSrc: Temporal  PainSc: 0-No pain         Complications: No complications documented.

## 2020-11-26 NOTE — H&P (Signed)
Evan Bellows, MD [address] [place], [place], [place], [address] [address], [place], [place], [place], [address] Phone: [phone]  Fax: [phone]  Primary Care Physician:  [name], NP   Pre-Procedure History & Physical: HPI:  Evan [name] is a 65 y.o. male is here for an endoscopy and colonoscopy    Past Medical History:  Diagnosis Date  . Anginal pain ([place])   . Anxiety disorder   . Asthma   . Atresia of esophagus without fistula   . CAD (coronary artery disease)   . Cellulitis   . CHF (congestive heart failure) ([place])    NYHA CLASS III,CHRONIC,DIASTOLIC  . COPD (chronic obstructive pulmonary disease) ([place])   . COVID-19   . Diabetes mellitus without complication ([place])   . Edema    RIGHT LOWER LEG  . Gastroesophageal reflux   . H/O: GI bleed   . History of pneumonia    Remote  . History of scarlet fever    Childhood  . Hyperlipidemia   . Hypertension   . Myocardial infarction ([place]) 2009  . Obesity   . Obstructive sleep apnea   . Pain    CHRONIC BACK / ABDOMINAL  . Panic disorder   . Peripheral venous insufficiency   . PTSD (post-traumatic stress disorder)   . Retinopathy    DIABETIC  . Stasis, venous   . Stroke ([place])   . Vertigo     Past Surgical History:  Procedure Laterality Date  . CARDIAC CATHETERIZATION    . CATARACT EXTRACTION Left   . CATARACT EXTRACTION W/PHACO Right 05/03/2017   Procedure: CATARACT EXTRACTION PHACO AND INTRAOCULAR LENS PLACEMENT (IOC);  Surgeon: Leandrew Koyanagi, MD;  Location: [facility] ORS;  Service: Ophthalmology;  Laterality: Right;  [place] 00:35.3 AP% 12.3 CDE 4.33 Fluid Pack lot # [id number] H       . CORONARY ANGIOPLASTY WITH STENT PLACEMENT  2002  . CORONARY ANGIOPLASTY WITH STENT PLACEMENT  1999  . CORONARY ARTERY BYPASS GRAFT     x7  . ESOPHAGOGASTRODUODENOSCOPY N/A 09/19/2016   Procedure: ESOPHAGOGASTRODUODENOSCOPY (EGD);  Surgeon: Lollie Sails, MD;  Location: [facility] ENDOSCOPY;  Service:  Endoscopy;  Laterality: N/A;    Prior to Admission medications   Medication Sig Start Date End Date Taking? Authorizing Provider  acetaminophen (TYLENOL) 500 MG tablet Take 500 mg by mouth every 6 (six) hours as needed.    [provider]  aspirin 81 MG chewable tablet Chew 1 tablet (81 mg total) by mouth daily. 11/23/20   [name], Henrine Screws T, NP  atorvastatin (LIPITOR) 80 MG tablet Take 1 tablet (80 mg total) by mouth daily. 11/07/20 11/07/21  Loletha Grayer, MD  Blood Glucose Monitoring Suppl (ONETOUCH VERIO) w/Device KIT Use to check blood sugar 3 to 4 times a day and document.  Please bring to visits for review. 07/08/20   [name], Henrine Screws T, NP  budesonide-formoterol (SYMBICORT) 80-4.5 MCG/ACT inhaler Inhale 2 puffs into the lungs 2 (two) times daily. 09/24/20   [name], Henrine Screws T, NP  cholecalciferol (VITAMIN D3) 25 MCG (1000 UNIT) tablet Take 1 tablet (1,000 Units total) by mouth daily. 07/08/20   [name], NP  Elastic Bandages & Supports (MEDICAL COMPRESSION STOCKINGS) MISC 1 Package by Does not apply route daily. 08/28/20   Sable Feil, PA-C  empagliflozin (JARDIANCE) 10 MG TABS tablet Take 1 tablet (10 mg total) by mouth daily. 07/08/20   [name], Henrine Screws T, NP  ezetimibe (ZETIA) 10 MG tablet Take 1  tablet (10 mg total) by mouth daily. 10/07/20   [name], Henrine Screws T, NP  furosemide (LASIX) 40 MG tablet TAKE 2 TABLETS BY MOUTH ONCE EVERY MORNING AND 1 TABLET ONCE EVERY EVENING 05/18/20   [name], [name] T, NP  glucose blood (ONETOUCH VERIO) test strip Use to check blood sugar 3 to 4 times a day. 07/08/20   [name], Henrine Screws T, NP  metoprolol succinate (TOPROL XL) 50 MG 24 hr tablet Take 1 tablet (50 mg total) by mouth daily. 07/08/20   [name], Henrine Screws T, NP  nitroGLYCERIN (NITROSTAT) 0.4 MG SL tablet Place 1 tablet (0.4 mg total) under the tongue every 5 (five) minutes as needed for chest pain. 10/10/19   [name], Henrine Screws T, NP  Omega-3 Fatty Acids (OMEGA 3 500 PO) Take 500 mg by mouth  daily.    [provider]  OneTouch Delica Lancets 62Z MISC Use to check blood sugar 2-3 times a day. 09/29/20   [name], Henrine Screws T, NP  pantoprazole (PROTONIX) 40 MG tablet Take 1 tablet (40 mg total) by mouth daily. 04/08/20   [name], Henrine Screws T, NP  risperiDONE (RISPERDAL) 0.5 MG tablet Take 1 tablet (0.5 mg total) by mouth 2 (two) times daily. 07/08/20   [name], Henrine Screws T, NP  rivaroxaban (XARELTO) 20 MG TABS tablet Take 1 tablet (20 mg total) by mouth at bedtime. 07/08/20   [name] T, NP  sharps container 1 each by Does not apply route as needed. 09/29/20   [name], NP  Skin Protectants, Misc. (EUCERIN) cream Apply topically as needed for dry skin. 08/28/20   Sable Feil, PA-C  triamcinolone (KENALOG) 0.1 % APPLY TO AFFECTED AREA(s) TWICE DAILY 10/18/20   [name] T, NP    Allergies as of 11/25/2020 - Review Complete 11/25/2020  Allergen Reaction Noted  . Penicillin g Hives 01/09/2014  . Sulfa antibiotics Hives 01/09/2014  . Tiotropium  04/26/2017  . Zoloft [sertraline hcl] Other (See Comments) 09/18/2016    Family History  Problem Relation Age of Onset  . Heart attack Mother   . Hypertension Mother   . Hyperlipidemia Mother   . Heart attack Brother 29       MI  . Coronary artery disease Other     Social History   Socioeconomic History  . Marital status: Single    Spouse name: Not on file  . Number of children: Not on file  . Years of education: Not on file  . Highest education level: Not on file  Occupational History    Comment: disabled  Tobacco Use  . Smoking status: Never Smoker  . Smokeless tobacco: Never Used  Vaping Use  . Vaping Use: Never used  Substance and Sexual Activity  . Alcohol use: No  . Drug use: No  . Sexual activity: Not Currently  Other Topics Concern  . Not on file  Social History Narrative   Disabled   Single   Social Determinants of Health   Financial Resource Strain: Low Risk   . Difficulty of Paying  Living Expenses: Not hard at all  Food Insecurity: No Food Insecurity  . Worried About Charity fundraiser in the Last Year: Never true  . Ran Out of Food in the Last Year: Never true  Transportation Needs: Not on file  Physical Activity: Inactive  . Days of Exercise per Week: 0 days  . Minutes of Exercise per Session: 0 min  Stress: No Stress Concern Present  . Feeling of Stress : Not  at all  Social Connections: Not on file  Intimate Partner Violence: Not on file    Review of Systems: See HPI, otherwise negative ROS  Physical Exam: There were no vitals taken for this visit. General:   Alert,  pleasant and cooperative in NAD Head:  Normocephalic and atraumatic. Neck:  Supple; no masses or thyromegaly. Lungs:  Clear throughout to auscultation, normal respiratory effort.    Heart:  +S1, +S2, Regular rate and rhythm, No edema. Abdomen:  Soft, nontender and nondistended. Normal bowel sounds, without guarding, and without rebound.   Neurologic:  Alert and  oriented x4;  grossly normal neurologically.  Impression/Plan: [name] Cordaro is here for an endoscopy and colonoscopy  to be performed for  evaluation of dysphagia and colon cancer screening     Risks, benefits, limitations, and alternatives regarding endoscopy have been reviewed with the patient.  Questions have been answered.  All parties agreeable.   Evan Bellows, MD  11/26/2020, 9:59 AM

## 2020-11-26 NOTE — Anesthesia Postprocedure Evaluation (Signed)
Anesthesia Post Note  Patient: Evan [name]  Procedure(s) Performed: COLONOSCOPY WITH PROPOFOL (N/A ) ESOPHAGOGASTRODUODENOSCOPY (EGD) WITH PROPOFOL  Patient location during evaluation: Endoscopy Anesthesia Type: General Level of consciousness: awake and alert Pain management: pain level controlled Vital Signs Assessment: post-procedure vital signs reviewed and stable Respiratory status: spontaneous breathing and respiratory function stable Cardiovascular status: stable Anesthetic complications: no   No complications documented.   Last Vitals:  Vitals:   11/26/20 1058 11/26/20 1108  BP: 103/65 117/86  Pulse: 86 98  Resp: 20 (!) 21  Temp: (!) 36 C   SpO2: 93% 94%    Last Pain:  Vitals:   11/26/20 1108  TempSrc:   PainSc: 0-No pain                 Nikiyah Fackler K

## 2020-11-26 NOTE — Telephone Encounter (Signed)
Pt has [place] f.u apt on 12/03/2020

## 2020-11-26 NOTE — Telephone Encounter (Signed)
Evan [name] with [facility] called asking if they can get an ok on the Atrovastatin 80 mg  That the hospital doctor prescribed.

## 2020-11-26 NOTE — Anesthesia Preprocedure Evaluation (Addendum)
Anesthesia Evaluation  Patient identified by MRN, date of birth, ID band Patient awake    Reviewed: Allergy & Precautions, NPO status , Patient's Chart, lab work & pertinent test results  History of Anesthesia Complications Negative for: history of anesthetic complications  Airway Mallampati: III       Dental  (+) Poor Dentition, Chipped, Loose, Missing   Pulmonary asthma , sleep apnea and Continuous Positive Airway Pressure Ventilation , COPD,  COPD inhaler,           Cardiovascular hypertension, Pt. on medications + Past MI, + CABG and +CHF  (-) dysrhythmias (-) Valvular Problems/Murmurs     Neuro/Psych neg Seizures Anxiety CVA (arm weakness, speech difficulties), No Residual Symptoms    GI/Hepatic Neg liver ROS, GERD  Medicated,  Endo/Other  diabetes, Type 2, Oral Hypoglycemic Agents  Renal/GU negative Renal ROS     Musculoskeletal   Abdominal   Peds  Hematology   Anesthesia Other Findings   Reproductive/Obstetrics                            Anesthesia Physical Anesthesia Plan  ASA: III  Anesthesia Plan: General   Post-op Pain Management:    Induction:   PONV Risk Score and Plan:   Airway Management Planned: Nasal Cannula  Additional Equipment:   Intra-op Plan:   Post-operative Plan:   Informed Consent: I have reviewed the patients History and Physical, chart, labs and discussed the procedure including the risks, benefits and alternatives for the proposed anesthesia with the patient or authorized representative who has indicated his/her understanding and acceptance.       Plan Discussed with:   Anesthesia Plan Comments:         Anesthesia Quick Evaluation

## 2020-11-26 NOTE — Telephone Encounter (Signed)
Sent this in

## 2020-11-26 NOTE — Telephone Encounter (Signed)
LVM To let him know it was sent.

## 2020-11-27 NOTE — H&P (Signed)
Jonathon Bellows, MD [address] [place], [place], [place], [address] [address], [place], [place], [place], [address] Phone: [phone]  Fax: [phone]  Primary Care Physician:  [name], NP   Pre-Procedure History & Physical: HPI:  Evan [name] is a 65 y.o. male is here for an endoscopy and colonoscopy    Past Medical History:  Diagnosis Date  . Anginal pain ([place])   . Anxiety disorder   . Asthma   . Atresia of esophagus without fistula   . CAD (coronary artery disease)   . Cellulitis   . CHF (congestive heart failure) ([place])    NYHA CLASS III,CHRONIC,DIASTOLIC  . COPD (chronic obstructive pulmonary disease) ([place])   . COVID-19   . Diabetes mellitus without complication ([place])   . Edema    RIGHT LOWER LEG  . Gastroesophageal reflux   . H/O: GI bleed   . History of pneumonia    Remote  . History of scarlet fever    Childhood  . Hyperlipidemia   . Hypertension   . Myocardial infarction ([place]) 2009  . Obesity   . Obstructive sleep apnea   . Pain    CHRONIC BACK / ABDOMINAL  . Panic disorder   . Peripheral venous insufficiency   . PTSD (post-traumatic stress disorder)   . Retinopathy    DIABETIC  . Stasis, venous   . Stroke ([place])   . Vertigo     Past Surgical History:  Procedure Laterality Date  . CARDIAC CATHETERIZATION    . CATARACT EXTRACTION Left   . CATARACT EXTRACTION W/PHACO Right 05/03/2017   Procedure: CATARACT EXTRACTION PHACO AND INTRAOCULAR LENS PLACEMENT (IOC);  Surgeon: Leandrew Koyanagi, MD;  Location: [facility] ORS;  Service: Ophthalmology;  Laterality: Right;  [place] 00:35.3 AP% 12.3 CDE 4.33 Fluid Pack lot # [id number] H       . CORONARY ANGIOPLASTY WITH STENT PLACEMENT  2002  . CORONARY ANGIOPLASTY WITH STENT PLACEMENT  1999  . CORONARY ARTERY BYPASS GRAFT     x7  . ESOPHAGOGASTRODUODENOSCOPY N/A 09/19/2016   Procedure: ESOPHAGOGASTRODUODENOSCOPY (EGD);  Surgeon: Lollie Sails, MD;  Location: [facility] ENDOSCOPY;  Service:  Endoscopy;  Laterality: N/A;    Prior to Admission medications   Medication Sig Start Date End Date Taking? Authorizing Provider  aspirin 81 MG chewable tablet Chew 1 tablet (81 mg total) by mouth daily. 11/23/20  Yes [name], [name] T, NP  budesonide-formoterol (SYMBICORT) 80-4.5 MCG/ACT inhaler Inhale 2 puffs into the lungs 2 (two) times daily. 09/24/20  Yes [name], [name] T, NP  metoprolol succinate (TOPROL XL) 50 MG 24 hr tablet Take 1 tablet (50 mg total) by mouth daily. 07/08/20  Yes [name], Henrine Screws T, NP  acetaminophen (TYLENOL) 500 MG tablet Take 500 mg by mouth every 6 (six) hours as needed.    [provider]  atorvastatin (LIPITOR) 80 MG tablet Take 1 tablet (80 mg total) by mouth daily. 11/26/20 02/19/22  [name], Henrine Screws T, NP  Blood Glucose Monitoring Suppl (ONETOUCH VERIO) w/Device KIT Use to check blood sugar 3 to 4 times a day and document.  Please bring to visits for review. 07/08/20   [name], Henrine Screws T, NP  cholecalciferol (VITAMIN D3) 25 MCG (1000 UNIT) tablet Take 1 tablet (1,000 Units total) by mouth daily. 07/08/20   [name], NP  Elastic Bandages & Supports (MEDICAL COMPRESSION STOCKINGS) MISC 1 Package by Does not apply route daily. 08/28/20   Sable Feil, PA-C  empagliflozin (JARDIANCE) 10  MG TABS tablet Take 1 tablet (10 mg total) by mouth daily. 07/08/20   [name], Henrine Screws T, NP  ezetimibe (ZETIA) 10 MG tablet Take 1 tablet (10 mg total) by mouth daily. 10/07/20   [name], Henrine Screws T, NP  furosemide (LASIX) 40 MG tablet TAKE 2 TABLETS BY MOUTH ONCE EVERY MORNING AND 1 TABLET ONCE EVERY EVENING 05/18/20   [name], [name] T, NP  glucose blood (ONETOUCH VERIO) test strip Use to check blood sugar 3 to 4 times a day. 07/08/20   [name], Henrine Screws T, NP  nitroGLYCERIN (NITROSTAT) 0.4 MG SL tablet Place 1 tablet (0.4 mg total) under the tongue every 5 (five) minutes as needed for chest pain. 10/10/19   [name], Henrine Screws T, NP  Omega-3 Fatty Acids (OMEGA 3 500 PO) Take 500 mg by  mouth daily.    [provider]  OneTouch Delica Lancets 73A MISC Use to check blood sugar 2-3 times a day. 09/29/20   [name], Henrine Screws T, NP  pantoprazole (PROTONIX) 40 MG tablet Take 1 tablet (40 mg total) by mouth daily. 04/08/20   [name], Henrine Screws T, NP  risperiDONE (RISPERDAL) 0.5 MG tablet Take 1 tablet (0.5 mg total) by mouth 2 (two) times daily. 07/08/20   [name], Henrine Screws T, NP  rivaroxaban (XARELTO) 20 MG TABS tablet Take 1 tablet (20 mg total) by mouth at bedtime. 07/08/20   [name] T, NP  sharps container 1 each by Does not apply route as needed. 09/29/20   [name], NP  Skin Protectants, Misc. (EUCERIN) cream Apply topically as needed for dry skin. 08/28/20   Sable Feil, PA-C  triamcinolone (KENALOG) 0.1 % APPLY TO AFFECTED AREA(s) TWICE DAILY 10/18/20   [name] T, NP    Allergies as of 11/05/2020 - Review Complete 10/26/2020  Allergen Reaction Noted  . Penicillin g Hives 01/09/2014  . Sulfa antibiotics Hives 01/09/2014  . Tiotropium  04/26/2017  . Zoloft [sertraline hcl] Other (See Comments) 09/18/2016    Family History  Problem Relation Age of Onset  . Heart attack Mother   . Hypertension Mother   . Hyperlipidemia Mother   . Heart attack Brother 43       MI  . Coronary artery disease Other     Social History   Socioeconomic History  . Marital status: Single    Spouse name: Not on file  . Number of children: Not on file  . Years of education: Not on file  . Highest education level: Not on file  Occupational History    Comment: disabled  Tobacco Use  . Smoking status: Never Smoker  . Smokeless tobacco: Never Used  Vaping Use  . Vaping Use: Never used  Substance and Sexual Activity  . Alcohol use: No  . Drug use: No  . Sexual activity: Not Currently  Other Topics Concern  . Not on file  Social History Narrative   Disabled   Single   Social Determinants of Health   Financial Resource Strain: Low Risk   . Difficulty of  Paying Living Expenses: Not hard at all  Food Insecurity: No Food Insecurity  . Worried About Charity fundraiser in the Last Year: Never true  . Ran Out of Food in the Last Year: Never true  Transportation Needs: Not on file  Physical Activity: Inactive  . Days of Exercise per Week: 0 days  . Minutes of Exercise per Session: 0 min  Stress: No Stress Concern Present  . Feeling of Stress :  Not at all  Social Connections: Not on file  Intimate Partner Violence: Not on file    Review of Systems: See HPI, otherwise negative ROS  Physical Exam: BP (!) 169/84   Pulse 83   Temp 97.6 F (36.4 C) (Temporal)   Resp 16   Ht 5' 5"  (1.651 m)   Wt 107.5 kg   SpO2 97%   BMI 39.44 kg/m  General:   Alert,  pleasant and cooperative in NAD Head:  Normocephalic and atraumatic. Neck:  Supple; no masses or thyromegaly. Lungs:  Clear throughout to auscultation, normal respiratory effort.    Heart:  +S1, +S2, Regular rate and rhythm, No edema. Abdomen:  Soft, nontender and nondistended. Normal bowel sounds, without guarding, and without rebound.   Neurologic:  Alert and  oriented x4;  grossly normal neurologically.  Impression/Plan: Evan [name] is here for an endoscopy and colonoscopy  to be performed for  evaluation of dysphagia and colon cancer screening     Risks, benefits, limitations, and alternatives regarding endoscopy have been reviewed with the patient.  Questions have been answered.  All parties agreeable.   Jonathon Bellows, MD  11/27/2020, 9:48 AM

## 2020-11-29 ENCOUNTER — Encounter: Payer: Self-pay | Admitting: Gastroenterology

## 2020-11-29 LAB — SURGICAL PATHOLOGY

## 2020-11-29 NOTE — Op Note (Signed)
[facility] Patient Name: Evan [name] Procedure Date: 11/26/2020 10:04 AM MRN: [id number] Account #: [id number] Date of Birth: 11/05/1955 Admit Type: Outpatient Age: 65 Room: [facility] ENDO ROOM 4 Gender: Male Note Status: Finalized Procedure:             Colonoscopy Indications:           Screening for colorectal malignant neoplasm Providers:             Jonathon Bellows MD, MD Referring MD:          [name]. Ned Card (Referring MD) Medicines:             Monitored Anesthesia Care Complications:         No immediate complications. Procedure:             Pre-Anesthesia Assessment:                        - Prior to the procedure, a History and Physical was                         performed, and patient medications, allergies and                         sensitivities were reviewed. The patient's tolerance                         of previous anesthesia was reviewed.                        - The risks and benefits of the procedure and the                         sedation options and risks were discussed with the                         patient. All questions were answered and informed                         consent was obtained.                        - ASA Grade Assessment: II - A patient with mild                         systemic disease.                        After obtaining informed consent, the colonoscope was                         passed under direct vision. Throughout the procedure,                         the patient's blood pressure, pulse, and oxygen                         saturations were monitored continuously. The                         Colonoscope was introduced through the anus and  advanced to the the cecum, identified by the                         appendiceal orifice. The colonoscopy was performed                         with ease. The patient tolerated the procedure well.                         The quality of  the bowel preparation was excellent. Findings:      The perianal and digital rectal examinations were normal.      A 12 mm polyp was found in the cecum. The polyp was sessile. The polyp       was removed with a cold snare. Resection and retrieval were complete. To       prevent bleeding after the polypectomy, one hemostatic clip was       successfully placed. There was no bleeding during, or at the end, of the       procedure.      Non-bleeding internal hemorrhoids were found during retroflexion. The       hemorrhoids were large and Grade I (internal hemorrhoids that do not       prolapse).      The exam was otherwise without abnormality on direct and retroflexion       views. Impression:            - One 12 mm polyp in the cecum, removed with a cold                         snare. Resected and retrieved. Clip was placed.                        - Non-bleeding internal hemorrhoids.                        - The examination was otherwise normal on direct and                         retroflexion views. Recommendation:        - Discharge patient to home (with escort).                        - Resume previous diet.                        - Continue present medications.                        - Await pathology results.                        - Repeat colonoscopy for surveillance based on                         pathology results. Procedure Code(s):     --- Professional ---                        [phone], Colonoscopy, flexible; with removal of  tumor(s), polyp(s), or other lesion(s) by snare                         technique Diagnosis Code(s):     --- Professional ---                        Z12.11, Encounter for screening for malignant neoplasm                         of colon                        K63.5, Polyp of colon                        K64.0, First degree hemorrhoids CPT copyright 2019 American Medical Association. All rights reserved. The codes documented in  this report are preliminary and upon coder review may  be revised to meet current compliance requirements. Jonathon Bellows, MD Jonathon Bellows MD, MD 11/26/2020 10:56:59 AM This report has been signed electronically. Number of Addenda: 0 Note Initiated On: 11/26/2020 10:04 AM Scope Withdrawal Time: 0 hours 18 minutes 25 seconds  Total Procedure Duration: 0 hours 20 minutes 44 seconds  Estimated Blood Loss:  Estimated blood loss: none.      [facility]

## 2020-11-29 NOTE — Op Note (Signed)
[facility] Patient Name: Evan [name] Procedure Date: 11/26/2020 10:22 AM MRN: [id number] Account #: [id number] Date of Birth: Aug 16, 1955 Admit Type: Outpatient Age: 65 Room: [facility] ENDO ROOM 4 Gender: Male Note Status: Finalized Procedure:             Upper GI endoscopy Indications:           Dysphagia Providers:             Jonathon Bellows MD, MD Medicines:             Monitored Anesthesia Care Complications:         No immediate complications. Procedure:             Pre-Anesthesia Assessment:                        - Prior to the procedure, a History and Physical was                         performed, and patient medications, allergies and                         sensitivities were reviewed. The patient's tolerance                         of previous anesthesia was reviewed.                        - The risks and benefits of the procedure and the                         sedation options and risks were discussed with the                         patient. All questions were answered and informed                         consent was obtained.                        - ASA Grade Assessment: II - A patient with mild                         systemic disease.                        After obtaining informed consent, the endoscope was                         passed under direct vision. Throughout the procedure,                         the patient's blood pressure, pulse, and oxygen                         saturations were monitored continuously. The Endoscope                         was introduced through the mouth, and advanced to the  third part of duodenum. The upper GI endoscopy was                         accomplished with ease. The patient tolerated the                         procedure well. Findings:      The examined duodenum was normal.      The stomach was normal.      The cardia and gastric fundus were normal on  retroflexion.      The examined esophagus was normal. Biopsies were obtained from the       proximal and distal esophagus with cold forceps for histology of       suspected eosinophilic esophagitis. Impression:            - Normal examined duodenum.                        - Normal stomach.                        - Normal esophagus. Biopsied. Recommendation:        - Await pathology results.                        - Perform a colonoscopy today. Procedure Code(s):     --- Professional ---                        [phone], Esophagogastroduodenoscopy, flexible,                         transoral; with biopsy, single or multiple Diagnosis Code(s):     --- Professional ---                        R13.10, Dysphagia, unspecified CPT copyright 2019 American Medical Association. All rights reserved. The codes documented in this report are preliminary and upon coder review may  be revised to meet current compliance requirements. Jonathon Bellows, MD Jonathon Bellows MD, MD 11/26/2020 10:32:58 AM This report has been signed electronically. Number of Addenda: 0 Note Initiated On: 11/26/2020 10:22 AM Estimated Blood Loss:  Estimated blood loss: none.      [facility]

## 2020-11-30 ENCOUNTER — Encounter: Payer: Self-pay | Admitting: Gastroenterology

## 2020-12-02 ENCOUNTER — Telehealth: Payer: Self-pay

## 2020-12-02 NOTE — Telephone Encounter (Signed)
Called pt he is going to try to be here tomorrow

## 2020-12-02 NOTE — Telephone Encounter (Signed)
Copied from [place] [id number]. Topic: General - Other >> Dec 02, 2020 12:36 PM Pawlus, Brayton Layman A wrote: Reason for CRM: Pt called back regarding his appt tomorrow, said if he does not show up that it is because his caretaker did not come to pick him up. Pt stated his caretaker has an attitude with him and he is worried she will not show up to pick him up.  Please advise pt if it would be better to do a virtual visit.

## 2020-12-03 ENCOUNTER — Other Ambulatory Visit: Payer: Self-pay

## 2020-12-03 ENCOUNTER — Telehealth: Payer: Self-pay

## 2020-12-03 ENCOUNTER — Encounter: Payer: Self-pay | Admitting: Nurse Practitioner

## 2020-12-03 ENCOUNTER — Ambulatory Visit (INDEPENDENT_AMBULATORY_CARE_PROVIDER_SITE_OTHER): Payer: Medicare Other | Admitting: Nurse Practitioner

## 2020-12-03 VITALS — BP 136/86 | HR 94 | Temp 99.0°F | Wt 226.0 lb

## 2020-12-03 DIAGNOSIS — I25118 Atherosclerotic heart disease of native coronary artery with other forms of angina pectoris: Secondary | ICD-10-CM | POA: Diagnosis not present

## 2020-12-03 DIAGNOSIS — E559 Vitamin D deficiency, unspecified: Secondary | ICD-10-CM

## 2020-12-03 DIAGNOSIS — E1159 Type 2 diabetes mellitus with other circulatory complications: Secondary | ICD-10-CM | POA: Diagnosis not present

## 2020-12-03 DIAGNOSIS — E785 Hyperlipidemia, unspecified: Secondary | ICD-10-CM

## 2020-12-03 DIAGNOSIS — E538 Deficiency of other specified B group vitamins: Secondary | ICD-10-CM

## 2020-12-03 DIAGNOSIS — I6521 Occlusion and stenosis of right carotid artery: Secondary | ICD-10-CM | POA: Diagnosis not present

## 2020-12-03 DIAGNOSIS — I878 Other specified disorders of veins: Secondary | ICD-10-CM | POA: Diagnosis not present

## 2020-12-03 DIAGNOSIS — I4819 Other persistent atrial fibrillation: Secondary | ICD-10-CM

## 2020-12-03 DIAGNOSIS — E1169 Type 2 diabetes mellitus with other specified complication: Secondary | ICD-10-CM

## 2020-12-03 DIAGNOSIS — I152 Hypertension secondary to endocrine disorders: Secondary | ICD-10-CM

## 2020-12-03 DIAGNOSIS — G4733 Obstructive sleep apnea (adult) (pediatric): Secondary | ICD-10-CM | POA: Diagnosis not present

## 2020-12-03 DIAGNOSIS — Z9989 Dependence on other enabling machines and devices: Secondary | ICD-10-CM

## 2020-12-03 DIAGNOSIS — F419 Anxiety disorder, unspecified: Secondary | ICD-10-CM

## 2020-12-03 DIAGNOSIS — I639 Cerebral infarction, unspecified: Secondary | ICD-10-CM | POA: Diagnosis not present

## 2020-12-03 MED ORDER — OLMESARTAN MEDOXOMIL 5 MG PO TABS: 10.0000 mg | ORAL_TABLET | Freq: Every day | ORAL | 4 refills | Status: DC

## 2020-12-03 NOTE — Patient Instructions (Signed)
Stroke Prevention Some medical conditions and lifestyle choices can lead to a higher risk for a stroke. You can help to prevent a stroke by eating healthy foods and exercising. It also helps to not smoke and to manage any health problems you may have. How can this condition affect me? A stroke is an emergency. It should be treated right away. A stroke can lead to brain damage or threaten your life. There is a better chance of surviving and getting better after a stroke if you get medical help right away. What can increase my risk? The following medical conditions may increase your risk of a stroke:  Diseases of the heart and blood vessels (cardiovascular disease).  High blood pressure (hypertension).  Diabetes.  High cholesterol.  Sickle cell disease.  Problems with blood clotting.  Being very overweight.  Sleeping problems (obstructivesleep apnea). Other risk factors include:  Being older than age 60.  A history of blood clots, stroke, or mini-stroke (TIA).  Race, ethnic background, or a family history of stroke.  Smoking or using tobacco products.  Taking birth control pills, especially if you smoke.  Heavy alcohol and drug use.  Not being active. What actions can I take to prevent this? Manage your health conditions  High cholesterol. ? Eat a healthy diet. If this is not enough to manage your cholesterol, you may need to take medicines. ? Take medicines as told by your doctor.  High blood pressure. ? Try to keep your blood pressure below 130/80. ? If your blood pressure cannot be managed through a healthy diet and regular exercise, you may need to take medicines. ? Take medicines as told by your doctor. ? Ask your doctor if you should check your blood pressure at home. ? Have your blood pressure checked every year.  Diabetes. ? Eat a healthy diet and get regular exercise. If your blood sugar (glucose) cannot be managed through diet and exercise, you may need to  take medicines. ? Take medicines as told by your doctor.  Talk to your doctor about getting checked for sleeping problems. Signs of a problem can include: ? Snoring a lot. ? Feeling very tired.  Make sure that you manage any other conditions you have. Nutrition  Follow instructions from your doctor about what to eat or drink. You may be told to: ? Eat and drink fewer calories each day. ? Limit how much salt (sodium) you use to 1,500 milligrams (mg) each day. ? Use only healthy fats for cooking, such as olive oil, canola oil, and sunflower oil. ? Eat healthy foods. To do this:  Choose foods that are high in fiber. These include whole grains, and fresh fruits and vegetables.  Eat at least 5 servings of fruits and vegetables a day. Try to fill one-half of your plate with fruits and vegetables at each meal.  Choose low-fat (lean) proteins. These include low-fat cuts of meat, chicken without skin, fish, tofu, beans, and nuts.  Eat low-fat dairy products. ? Avoid foods that:  Are high in salt.  Have saturated fat.  Have trans fat.  Have cholesterol.  Are processed or pre-made. ? Count how many carbohydrates you eat and drink each day.   Lifestyle  If you drink alcohol: ? Limit how much you have to:  0-1 drink a day for women who are not pregnant.  0-2 drinks a day for men. ? Know how much alcohol is in your drink. In the [place] one drink equals one 12 oz bottle   of beer (355mL), one 5 oz glass of wine (148mL), or one 1 oz glass of hard liquor (44mL).  Do not smoke or use any products that have nicotine or tobacco. If you need help quitting, ask your doctor.  Avoid secondhand smoke.  Do not use drugs. Activity  Try to stay at a healthy weight.  Get at least 30 minutes of exercise on most days, such as: ? Fast walking. ? Biking. ? Swimming.   Medicines  Take over-the-counter and prescription medicines only as told by your doctor.  Avoid taking birth control pills.  Talk to your doctor about the risks of taking birth control pills if: ? You are over 35 years old. ? You smoke. ? You get very bad headaches. ? You have had a blood clot. Where to find more information  American Stroke Association: www.strokeassociation.org Get help right away if:  You or a loved one has any signs of a stroke. "BE FAST" is an easy way to remember the warning signs: ? B - Balance. Dizziness, sudden trouble walking, or loss of balance. ? E - Eyes. Trouble seeing or a change in how you see. ? F - Face. Sudden weakness or loss of feeling of the face. The face or eyelid may droop on one side. ? A - Arms. Weakness or loss of feeling in an arm. This happens all of a sudden and most often on one side of the body. ? S - Speech. Sudden trouble speaking, slurred speech, or trouble understanding what people say. ? T - Time. Time to call emergency services. Write down what time symptoms started.  You or a loved one has other signs of a stroke, such as: ? A sudden, very bad headache with no known cause. ? Feeling like you may vomit (nausea). ? Vomiting. ? A seizure. These symptoms may be an emergency. Get help right away. Call your local emergency services (911 in the [place]  Do not wait to see if the symptoms will go away.  Do not drive yourself to the hospital. Summary  You can help to prevent a stroke by eating healthy, exercising, and not smoking. It also helps to manage any health problems you have.  Do not smoke or use any products that contain nicotine or tobacco.  Get help right away if you or a loved one has any signs of a stroke. This information is not intended to replace advice given to you by your health care provider. Make sure you discuss any questions you have with your health care provider. Document Revised: 02/23/2020 Document Reviewed: 02/23/2020 Elsevier Patient Education  2021 Elsevier Inc.  

## 2020-12-03 NOTE — Assessment & Plan Note (Signed)
Chronic, ongoing with A1C recently 6.8% and urine ALB 30 and A:C 30-300 last visit.  Lisinopril stopped in hospital -- will restart ARB for kidney protection.  Avoid ACE due to COPD -- Olmesartan 10 MG started.  Continue current Jardiance, benefit with his HF.  Check BMP today.  Recommend he check BS at least 3 mornings a week at home -- resent glucometer to [place] last visit.  Return to office in 3 months.  Continue pill packs for consistent medication adherence.

## 2020-12-03 NOTE — Assessment & Plan Note (Signed)
Chronic, ongoing with no ulcerations noted.  Recommend he wear compression hose daily and monitor skin closely.  Referral to vascular placed.

## 2020-12-03 NOTE — Assessment & Plan Note (Signed)
Chronic, ongoing.  BP above goal for stroke prevention.  Add on Olmesartan 10 MG daily and continue Metoprolol.  Recommend checking BP at home a few mornings a week at home and documenting + focus on DASH diet.  Continue collaboration with cardiology. BMP and CBC today. Return in 4 weeks.

## 2020-12-03 NOTE — Assessment & Plan Note (Signed)
Chronic, stable with no angina symptoms.  Continue current medication regimen and collaboration with cardiology. °

## 2020-12-03 NOTE — Telephone Encounter (Signed)
Copied from [place] [id number]. Topic: Referral - Status >> Dec 03, 2020  3:35 PM Yvette Rack wrote: Reason for CRM: Pt stated he does not want the caregiver he currently has nor the agency that she works for. Pt requests a referral to another agency.

## 2020-12-03 NOTE — Telephone Encounter (Signed)
Jasmine could you assist with this?  Brooke worked closely with this patient.

## 2020-12-03 NOTE — Assessment & Plan Note (Signed)
Referral to Vascular placed.  Continue current medication regimen for prevention.

## 2020-12-03 NOTE — Progress Notes (Signed)
BP 136/86   Pulse 94   Temp 99 F (37.2 C) (Oral)   Wt 226 lb (102.5 kg)   SpO2 97%   BMI 37.61 kg/m    Subjective:    Patient ID: Evan [name], male    DOB: 07/14/1956, 65 y.o.   MRN: [id number]  HPI: Evan [name] is a 65 y.o. male  Chief Complaint  Patient presents with  . Hospitalization Follow-up   Transition of Care Hospital Follow up.  Was recently admitted to hospital on 11/06/2020 for acute CVA with MRI noting a 9 mm right thalamic infarct.  He was discharged on 11/07/2020.  Is to follow-up with neurology in the outpatient setting.  He was noted to have 60% stenosis right internal carotid artery on CT angio, currently does not have vascular follow-up in place.  LDL in hospital was 135, prior was 174, and A1c 6.8%.  He has pill packs at home, but is not always adherent with medications, which has been a trend on review in past too.  He reports today that Evan [name] was with him when he had the stroke and reported he "looked really pale, I think you are having a stroke".     " HOSPITAL COURSE:   1.  Acute CVA.  MRI showing a 9 mm right thalamic infarct.  Patient seen by neurology.  Will need neurological follow-up as outpatient.  Continue Xarelto and aspirin.  Crestor changed over to Lipitor as per neurology.  CT angio did show 60% stenosis right internal carotid artery which will need to be followed up.  Echocardiogram did not show any clot in the heart.  Did show moderate aortic stenosis.  LDL 135.  Can consider PCSK9 inhibitor if LDL does not come down further with changing statin 2.  Type 2 diabetes mellitus with hyperlipidemia.  Can go back on empagliflozin as outpatient.  Hemoglobin A1c 6.8. 3.  Chronic diastolic congestive heart failure with moderate aortic stenosis.  Can go back on Lasix and Toprol but will hold off on lisinopril for right now.  Can restart as outpatient.  Follow-up with Dr. [name] cardiology as outpatient. 4.  Persistent atrial fibrillation  on Xarelto for anticoagulation. 5.  Obstructive sleep apnea on CPAP at night 6.  Obesity with a BMI of 37.61 7.  Delusional disorder and grandiose ideas.  Continue Risperdal twice daily.  Appreciate psychiatric consultation. 8.  Right carotid stenosis need to watch this closely for progression"  Hospital/Facility: [facility] D/C Physician:  Dr. Leslye Peer D/C Date: 11/07/2020  Records Requested: 11/08/2020 Records Received: 11/08/2020 Records Reviewed: 11/08/2020  Diagnoses on Discharge: Acute CVA and Acute stroke due ischemia  Date of interactive Contact within 48 hours of discharge:  Contact was through: phone  Date of 7 day or 14 day face-to-face visit:    over 14 days  Outpatient Encounter Medications as of 12/03/2020  Medication Sig  . acetaminophen (TYLENOL) 500 MG tablet Take 500 mg by mouth every 6 (six) hours as needed.  Marland Kitchen aspirin 81 MG chewable tablet Chew 1 tablet (81 mg total) by mouth daily.  Marland Kitchen atorvastatin (LIPITOR) 80 MG tablet Take 1 tablet (80 mg total) by mouth daily.  . Blood Glucose Monitoring Suppl (ONETOUCH VERIO) w/Device KIT Use to check blood sugar 3 to 4 times a day and document.  Please bring to visits for review.  . budesonide-formoterol (SYMBICORT) 80-4.5 MCG/ACT inhaler Inhale 2 puffs into the lungs 2 (two) times daily.  . cholecalciferol (VITAMIN D3) 25 MCG (  1000 UNIT) tablet Take 1 tablet (1,000 Units total) by mouth daily.  Regino Schultze Bandages & Supports (MEDICAL COMPRESSION STOCKINGS) MISC 1 Package by Does not apply route daily.  . empagliflozin (JARDIANCE) 10 MG TABS tablet Take 1 tablet (10 mg total) by mouth daily.  Marland Kitchen ezetimibe (ZETIA) 10 MG tablet Take 1 tablet (10 mg total) by mouth daily.  . furosemide (LASIX) 40 MG tablet TAKE 2 TABLETS BY MOUTH ONCE EVERY MORNING AND 1 TABLET ONCE EVERY EVENING  . glucose blood (ONETOUCH VERIO) test strip Use to check blood sugar 3 to 4 times a day.  . metoprolol succinate (TOPROL XL) 50 MG 24 hr tablet Take 1 tablet (50 mg  total) by mouth daily.  . nitroGLYCERIN (NITROSTAT) 0.4 MG SL tablet Place 1 tablet (0.4 mg total) under the tongue every 5 (five) minutes as needed for chest pain.  Marland Kitchen olmesartan (BENICAR) 5 MG tablet Take 2 tablets (10 mg total) by mouth daily.  . Omega-3 Fatty Acids (OMEGA 3 500 PO) Take 500 mg by mouth daily.  Glory Rosebush Delica Lancets 16X MISC Use to check blood sugar 2-3 times a day.  . pantoprazole (PROTONIX) 40 MG tablet Take 1 tablet (40 mg total) by mouth daily.  . risperiDONE (RISPERDAL) 0.5 MG tablet Take 1 tablet (0.5 mg total) by mouth 2 (two) times daily.  . rivaroxaban (XARELTO) 20 MG TABS tablet Take 1 tablet (20 mg total) by mouth at bedtime.  . sharps container 1 each by Does not apply route as needed.  . Skin Protectants, Misc. (EUCERIN) cream Apply topically as needed for dry skin.  Marland Kitchen triamcinolone (KENALOG) 0.1 % APPLY TO AFFECTED AREA(s) TWICE DAILY   No facility-administered encounter medications on file as of 12/03/2020.    Diagnostic Tests Reviewed/Disposition: Reviewed in note  Consults: neurology  Discharge Instructions: To follow-up with neurology  Disease/illness Education: Reviewed with patient today  Home Health/Community Services Discussions/Referrals: none at this time  Establishment or re-establishment of referral orders for community resources: None at this time  Discussion with other health care providers:  Reviewed notes  Assessment and Support of treatment regimen adherence: reviewed with patient  Appointments Coordinated with: reviewed with patient  Education for self-management, independent living, and ADLs: reviewed with patient  DIABETES Continues on Jardiance 10 MG daily.  A1C last visit 6.8% in April. Hypoglycemic episodes:no Polydipsia/polyuria: no Visual disturbance: no Chest pain: no Paresthesias: no Glucose Monitoring: no             Accucheck frequency: Not Checking             Fasting glucose:             Post prandial:              Evening:             Before meals: Taking Insulin?: no             Long acting insulin:             Short acting insulin: Blood Pressure Monitoring: not checking Retinal Examination: Up to Date Foot Exam: Up to Date Pneumovax: unknown Influenza: unknown Aspirin: yes   HYPERTENSION / HYPERLIPIDEMIA/HF & A-FIB Saw HF Clinic on 03/31/2020, no changes made.  Last saw Dr. [name] on 03/15/20.  Recent EF 11/06/20 was 50 to 55% with mild LVH, similar to previous. Uses CPAP at home about 100% of the time.  Taking Atorvastatin, Zetia, ASA, Metoprolol, Xarelto.  Has NTG at  home, but reports he has not used. Satisfied with current treatment? yes Duration of hypertension: chronic BP monitoring frequency: not checking BP range:  BP medication side effects: no Duration of hyperlipidemia: chronic Cholesterol medication side effects: no Cholesterol supplements: fish oil Medication compliance: good compliance Aspirin: yes Recent stressors: no Recurrent headaches: no Visual changes: no Palpitations: no Dyspnea: no Chest pain: no Lower extremity edema: no Dizzy/lightheaded: no   COPD Has Symbicort -- does not use this all the time, only as needed he reports. Never a smoker. COPD status: stable Satisfied with current treatment?: yes Oxygen use: no Dyspnea frequency: occasional at baseline Cough frequency: occasional at baseline Rescue inhaler frequency:  rarely Limitation of activity: no Productive cough: none Last Spirometry: unknown Pneumovax: given today Influenza: Up to Date  GERD Has been followed by GI for dysphagia, last visit was 08/31/20.  Continues on Protonix.  Had colonoscopy on 11/26/20 with Dr. Vicente Males -- polyps removed and to return in 3 years. GERD control status: stable Satisfied with current treatment? no Heartburn frequency: 1-2 times a week Medication side effects: no  Medication compliance: stable Previous GERD medications: Zantac Antacid use frequency:  1-2  times a week Duration: occasional  Nature: burning Location: epigastric Heartburn duration: 1 hour Alleviatiating factors:  TUMS sometimes Aggravating factors: certain foods Dysphagia: no Odynophagia:  no Hematemesis: no Blood in stool: no EGD: yes in 2017  ANXIETY/STRESS Continues on Seroquel for mood, which he reports is helpful.  He discussed today his friendships with Evan [name] and Virgel Gess, reporting they are friends and he talks with them often -- he often shows PCP his Instagram account at visits.  He reports having many famous friends and being related to [employer]. Duration:stable Anxious mood: no  Excessive worrying: no Irritability: no  Sweating: no Nausea: no Palpitations:no Hyperventilation: no Panic attacks: no Agoraphobia: no  Obscessions/compulsions: yes Depressed mood: no Depression screen [facility] 2/9 12/03/2020 10/06/2020 07/08/2020 04/08/2020 02/16/2020  Decreased Interest 0 0 0 0 0  Down, Depressed, Hopeless 0 0 0 0 0  PHQ - 2 Score 0 0 0 0 0  Altered sleeping 0 2 0 0 -  Tired, decreased energy 0 0 0 0 -  Change in appetite 0 0 0 0 -  Feeling bad or failure about yourself  0 0 0 0 -  Trouble concentrating 0 0 0 0 -  Moving slowly or fidgety/restless 0 0 0 0 -  Suicidal thoughts 0 0 0 0 -  PHQ-9 Score 0 2 0 0 -  Difficult doing work/chores Not difficult at all - Not difficult at all Not difficult at all -  Some recent data might be hidden    Relevant past medical, surgical, family and social history reviewed and updated as indicated. Interim medical history since our last visit reviewed. Allergies and medications reviewed and updated.  Review of Systems  Constitutional: Negative for activity change, diaphoresis, fatigue and fever.  Respiratory: Negative for cough, chest tightness, shortness of breath and wheezing.   Cardiovascular: Positive for leg swelling (baseline per his report). Negative for chest pain and palpitations.   Gastrointestinal: Negative.   Endocrine: Negative for cold intolerance, heat intolerance, polydipsia, polyphagia and polyuria.  Neurological: Negative for dizziness, syncope, weakness, light-headedness, numbness and headaches.  Psychiatric/Behavioral: Negative for decreased concentration, self-injury, sleep disturbance and suicidal ideas. The patient is nervous/anxious.     Per HPI unless specifically indicated above     Objective:    BP 136/86   Pulse 94  Temp 99 F (37.2 C) (Oral)   Wt 226 lb (102.5 kg)   SpO2 97%   BMI 37.61 kg/m   Wt Readings from Last 3 Encounters:  12/03/20 226 lb (102.5 kg)  11/26/20 235 lb (106.6 kg)  11/25/20 237 lb (107.5 kg)    Physical Exam Vitals and nursing note reviewed.  Constitutional:      General: He is awake. He is not in acute distress.    Appearance: He is well-developed. He is morbidly obese. He is not ill-appearing.  HENT:     Head: Normocephalic and atraumatic.     Right Ear: Hearing normal. No drainage.     Left Ear: Hearing normal. No drainage.  Eyes:     General: Lids are normal.        Right eye: No discharge.        Left eye: No discharge.     Pupils: Pupils are equal, round, and reactive to light.  Neck:     Thyroid: No thyromegaly.     Vascular: No carotid bruit or JVD.     Trachea: Trachea normal.  Cardiovascular:     Rate and Rhythm: Normal rate and regular rhythm.     Heart sounds: Normal heart sounds, S1 normal and S2 normal. No murmur heard. No gallop.   Pulmonary:     Effort: Pulmonary effort is normal. No accessory muscle usage or respiratory distress.     Breath sounds: Normal breath sounds.  Abdominal:     General: Bowel sounds are normal.     Palpations: Abdomen is soft.     Tenderness: There is no abdominal tenderness.  Musculoskeletal:        General: Normal range of motion.     Cervical back: Normal range of motion and neck supple.     Right lower leg: Edema (trace) present.     Left lower leg:  Edema (trace) present.  Lymphadenopathy:     Cervical: No cervical adenopathy.  Skin:    General: Skin is warm and dry.     Capillary Refill: Capillary refill takes less than 2 seconds.     Comments: Hemosiderin staining bilateral lower extremities, R>L, with varicosities present.  Skin intact.    Neurological:     Mental Status: He is alert and oriented to person, place, and time.     Cranial Nerves: Cranial nerves are intact.     Motor: Weakness (L>R upper and lower) and tremor (bilateral upper extremities with activity and rest) present.     Coordination: Finger-Nose-Finger Test and Heel to [place] Test normal.     Gait: Gait is intact.     Deep Tendon Reflexes: Reflexes are normal and symmetric.     Reflex Scores:      Brachioradialis reflexes are 2+ on the right side and 2+ on the left side.      Patellar reflexes are 2+ on the right side and 2+ on the left side. Psychiatric:        Attention and Perception: Attention normal.        Mood and Affect: Mood normal.        Speech: Speech normal.        Behavior: Behavior normal. Behavior is cooperative.     Comments: Tangential thought processes with occasional tearfulness.  Talked about his friendships with Evan [name] and Ivin Booty.  This is baseline for him.     Results for orders placed or performed during the hospital encounter of 11/26/20  Glucose, capillary  Result Value Ref Range   Glucose-Capillary 139 (H) 70 - 99 mg/dL  Surgical pathology  Result Value Ref Range   SURGICAL PATHOLOGY      SURGICAL PATHOLOGY CASE: [id number] PATIENT: [facility] Report     Specimen Submitted: A. Esophagus; cbx B. Colon polyp, cecum; cold snare  Clinical History: Screening colon, dysphagia.  Normal EGD, colon polyp, hemorrhoids      DIAGNOSIS: A. ESOPHAGUS; COLD BIOPSY: - SQUAMOUS MUCOSA WITH NO SIGNIFICANT PATHOLOGIC ALTERATION. - NEGATIVE FOR INCREASED EOSINOPHILS. - NEGATIVE FOR  INTESTINAL METAPLASIA, DYSPLASIA, AND MALIGNANCY.  B. COLON POLYP, CECUM COLD SNARE: - TUBULAR ADENOMA. - NEGATIVE FOR HIGH-GRADE DYSPLASIA AND MALIGNANCY.   GROSS DESCRIPTION: A. Labeled: Esophagus cbxs, rule out EOE Received: Formalin Collection time: 10:29 AM on 11/26/2020 Placed into formalin time: 10:29 AM on 11/26/2020 Tissue fragment(s): Multiple Size: Aggregate, 1.5 x 0.3 x 0.1 cm Description: White translucent soft tissue fragments Entirely submitted in 1 cassette.  B. Labeled: Cecum polyp cold snare Received: Formalin Collection time: 10 :37 AM on 11/26/2020 Placed into formalin time: 10:37 AM on 11/26/2020 Tissue fragment(s): Multiple Size: Aggregate, 1.6 x 0.5 x 0.2 cm Description: Tan soft tissue fragments Entirely submitted in 1 cassette.  RB 11/26/2020  Final Diagnosis performed by Betsy Pries, MD.   Electronically signed 11/29/2020 2:23:03PM The electronic signature indicates that the named Attending Pathologist has evaluated the specimen Technical component performed at [facility], [address], [place], [place] [address] Lab: [phone] Dir: Rush Farmer, MD, MMM  Professional component performed at [facility], [facility], [place], [place], [place] [address] Lab: [phone] Dir: Dellia Nims. Reuel Derby, MD       Assessment & Plan:   Problem List Items Addressed This Visit      Cardiovascular and Mediastinum   Hypertension associated with diabetes ([place])    Chronic, ongoing.  BP above goal for stroke prevention.  Add on Olmesartan 10 MG daily and continue Metoprolol.  Recommend checking BP at home a few mornings a week at home and documenting + focus on DASH diet.  Continue collaboration with cardiology. BMP and CBC today. Return in 4 weeks.      Relevant Medications   olmesartan (BENICAR) 5 MG tablet   Persistent atrial fibrillation (HCC)    Chronic, ongoing.  Continue collaboration with cardiology and current medication regimen,  including Metoprolol and Xarelto.  BMP today.      Relevant Medications   olmesartan (BENICAR) 5 MG tablet   CAD (coronary artery disease)    Chronic, stable with no angina symptoms.  Continue current medication regimen and collaboration with cardiology.      Relevant Medications   olmesartan (BENICAR) 5 MG tablet   Acute CVA (cerebrovascular accident) ([place])    Acute 9 mm right thalamic infarct on 11/06/2020.  At this time continue current medication regimen and will adjust as needed.  Discussed with him stroke prevention goals.  Referral to neurology & vascular for further assessment.  He declines PT/OT in home.      Relevant Medications   olmesartan (BENICAR) 5 MG tablet   Other Relevant Orders   Lipid Panel w/o Chol/HDL Ratio   CBC with Differential/Platelet   Ambulatory referral to Vascular Surgery   Ambulatory referral to Neurology   Stenosis of right carotid artery    Referral to Vascular placed.  Continue current medication regimen for prevention.      Relevant Medications   olmesartan (BENICAR) 5 MG tablet  Other Relevant Orders   Ambulatory referral to Vascular Surgery   Ambulatory referral to Neurology     Respiratory   Obstructive sleep apnea on CPAP    Chronic, ongoing.  Recommend continue 100% use of CPAP.        Endocrine   Type 2 diabetes mellitus with morbid obesity (HCC)    Chronic, ongoing with A1C recently 6.8% and urine ALB 30 and A:C 30-300 last visit.  Lisinopril stopped in hospital -- will restart ARB for kidney protection.  Avoid ACE due to COPD -- Olmesartan 10 MG started.  Continue current Jardiance, benefit with his HF.  Check BMP today.  Recommend he check BS at least 3 mornings a week at home -- resent glucometer to [place] last visit.  Return to office in 3 months.  Continue pill packs for consistent medication adherence.      Relevant Medications   olmesartan (BENICAR) 5 MG tablet   Other Relevant Orders   Basic metabolic panel   Lipid  Panel w/o Chol/HDL Ratio   Hyperlipidemia associated with type 2 diabetes mellitus ([place]) - Primary    Chronic, ongoing.  Continue current medication regimen and adjust as needed.  Return in 3 months for visit.  Lipid panel today.      Relevant Medications   olmesartan (BENICAR) 5 MG tablet   Other Relevant Orders   Lipid Panel w/o Chol/HDL Ratio     Other   Obesity, morbid (more than 100 lbs over ideal weight or BMI > 40) (HCC)    BMI 37.61 with T2DM, HTN.  Recommended eating smaller high protein, low fat meals more frequently and exercising 30 mins a day 5 times a week with a goal of 10-15lb weight loss in the next 3 months. Patient voiced their understanding and motivation to adhere to these recommendations.       Anxiety    Chronic, ongoing with some grandiose thought processes present, ?more schizophrenia on presentation.  Continue current medication regimen and adjust as needed.  Has history on review old records of missing doses Risperdal, will continue current medication at this time and adjust as needed -- pill packs have been beneficial for patient.  Continue collaboration with CCM SW.  Would benefit from psychiatry and therapy, but he refuses these.  Will continue to encourage this and discuss with him.      Chronic venous stasis    Chronic, ongoing with no ulcerations noted.  Recommend he wear compression hose daily and monitor skin closely.  Referral to vascular placed.       Other Visit Diagnoses    B12 deficiency       Recheck level today and restart supplement at needed   Relevant Orders   VITAMIN D 25 Hydroxy (Vit-D Deficiency, Fractures)   Vitamin D deficiency       Recheck level today and restart supplement at needed   Relevant Orders   Vitamin B12       Follow up plan: Return in about 4 weeks (around 12/31/2020) for CVA and BP .

## 2020-12-03 NOTE — Assessment & Plan Note (Signed)
Acute 9 mm right thalamic infarct on 11/06/2020.  At this time continue current medication regimen and will adjust as needed.  Discussed with him stroke prevention goals.  Referral to neurology & vascular for further assessment.  He declines PT/OT in home.

## 2020-12-03 NOTE — Assessment & Plan Note (Signed)
Chronic, ongoing with some grandiose thought processes present, ?more schizophrenia on presentation.  Continue current medication regimen and adjust as needed.  Has history on review old records of missing doses Risperdal, will continue current medication at this time and adjust as needed -- pill packs have been beneficial for patient.  Continue collaboration with CCM SW.  Would benefit from psychiatry and therapy, but he refuses these.  Will continue to encourage this and discuss with him.

## 2020-12-03 NOTE — Assessment & Plan Note (Signed)
Chronic, ongoing.  Recommend continue 100% use of CPAP.

## 2020-12-03 NOTE — Assessment & Plan Note (Signed)
BMI 37.61 with T2DM, HTN.  Recommended eating smaller high protein, low fat meals more frequently and exercising 30 mins a day 5 times a week with a goal of 10-15lb weight loss in the next 3 months. Patient voiced their understanding and motivation to adhere to these recommendations.

## 2020-12-03 NOTE — Assessment & Plan Note (Signed)
Chronic, ongoing.  Continue current medication regimen and adjust as needed.  Return in 3 months for visit.  Lipid panel today.

## 2020-12-03 NOTE — Assessment & Plan Note (Signed)
Chronic, ongoing.  Continue collaboration with cardiology and current medication regimen, including Metoprolol and Xarelto.  BMP today. 

## 2020-12-04 ENCOUNTER — Other Ambulatory Visit: Payer: Self-pay | Admitting: Nurse Practitioner

## 2020-12-04 DIAGNOSIS — E559 Vitamin D deficiency, unspecified: Secondary | ICD-10-CM | POA: Insufficient documentation

## 2020-12-04 DIAGNOSIS — E538 Deficiency of other specified B group vitamins: Secondary | ICD-10-CM | POA: Insufficient documentation

## 2020-12-04 LAB — BASIC METABOLIC PANEL
BUN/Creatinine Ratio: 15 (ref 10–24)
BUN: 19 mg/dL (ref 8–27)
CO2: 21 mmol/L (ref 20–29)
Calcium: 9.5 mg/dL (ref 8.6–10.2)
Chloride: 102 mmol/L (ref 96–106)
Creatinine, Ser: 1.24 mg/dL (ref 0.76–1.27)
Glucose: 103 mg/dL — ABNORMAL HIGH (ref 65–99)
Potassium: 4 mmol/L (ref 3.5–5.2)
Sodium: 145 mmol/L — ABNORMAL HIGH (ref 134–144)
eGFR: 64 mL/min/{1.73_m2} (ref >59)

## 2020-12-04 LAB — LIPID PANEL W/O CHOL/HDL RATIO
Cholesterol, Total: 199 mg/dL (ref 100–199)
HDL: 51 mg/dL (ref >39)
LDL Chol Calc (NIH): 122 mg/dL — ABNORMAL HIGH (ref 0–99)
Triglycerides: 144 mg/dL (ref 0–149)
VLDL Cholesterol Cal: 26 mg/dL (ref 5–40)

## 2020-12-04 LAB — CBC WITH DIFFERENTIAL/PLATELET
Basophils Absolute: 0.1 10*3/uL (ref 0.0–0.2)
Basos: 1 %
EOS (ABSOLUTE): 0.1 10*3/uL (ref 0.0–0.4)
Eos: 2 %
Hematocrit: 44.7 % (ref 37.5–51.0)
Hemoglobin: 14.7 g/dL (ref 13.0–17.7)
Immature Grans (Abs): 0 10*3/uL (ref 0.0–0.1)
Immature Granulocytes: 0 %
Lymphocytes Absolute: 1.5 10*3/uL (ref 0.7–3.1)
Lymphs: 18 %
MCH: 28.2 pg (ref 26.6–33.0)
MCHC: 32.9 g/dL (ref 31.5–35.7)
MCV: 86 fL (ref 79–97)
Monocytes Absolute: 0.6 10*3/uL (ref 0.1–0.9)
Monocytes: 7 %
Neutrophils Absolute: 6.2 10*3/uL (ref 1.4–7.0)
Neutrophils: 72 %
Platelets: 253 10*3/uL (ref 150–450)
RBC: 5.21 x10E6/uL (ref 4.14–5.80)
RDW: 15.2 % (ref 11.6–15.4)
WBC: 8.6 10*3/uL (ref 3.4–10.8)

## 2020-12-04 LAB — VITAMIN B12: Vitamin B-12: 370 pg/mL (ref 232–1245)

## 2020-12-04 LAB — VITAMIN D 25 HYDROXY (VIT D DEFICIENCY, FRACTURES): Vit D, 25-Hydroxy: 18.5 ng/mL — ABNORMAL LOW (ref 30.0–100.0)

## 2020-12-04 MED ORDER — CHOLECALCIFEROL 125 MCG (5000 UT) PO TABS: 5000.0000 [IU] | ORAL_TABLET | Freq: Every day | ORAL | 4 refills | Status: DC

## 2020-12-04 MED ORDER — VITAMIN B-12 1000 MCG PO TABS
1000.0000 ug | ORAL_TABLET | Freq: Every day | ORAL | 4 refills | Status: DC
Start: 2020-12-04 — End: 2021-08-12

## 2020-12-04 NOTE — Progress Notes (Signed)
Joren does not always check MyChart, please call and let Anvay know following, labs have returned: - Kidney function and liver function are stable on labs - Sodium was a little elevated on check, I would like him to reduce salt intake at home and ensure he gets good water intake at home. - Cholesterol levels continue to show LDL above goal for stroke prevention, but a little trend down.  I would like him to ensure he is taking all his medication in his pill packs daily as these contain his Zetia and Atorvastatin to help lower levels.  If these remain elevated next check we may have to switch him to a medication he injects to help lower levels. - CBC shows no anemia - Vitamin D and B12 remain on lower side, I am going to send in supplements to add to pill pack for him to take to help increase these levels.  Any questions? Keep being awesome!!  Thank you for allowing me to participate in your care. Kindest regards, Antwon Rochin

## 2020-12-06 ENCOUNTER — Telehealth: Payer: Self-pay | Admitting: Licensed Clinical Social Worker

## 2020-12-06 ENCOUNTER — Telehealth: Payer: Self-pay

## 2020-12-06 NOTE — Telephone Encounter (Signed)
    Clinical Social Work  Chronic Care Management   Phone Outreach    12/06/2020 Name: Evan [name] MRN: [id number] DOB: [birth year]  [name] is a 65 y.o. year old male who is a primary care patient of [name], [name], NP .   CCM LCSW reached out to patient today by phone to introduce self, assess needs and offer Care Management services and interventions.    Telephone outreach was unsuccessful A HIPPA compliant phone message was left for the patient providing contact information and requesting a return call.   Plan:CCM LCSW will wait for return call. If no return call is received, Will route chart to Care Guide to see if patient would like to reschedule phone appointment   Review of patient status, including review of consultants reports, relevant laboratory and other test results, and collaboration with appropriate care team members and the patient's provider was performed as part of comprehensive patient evaluation and provision of care management services.    [name], MSW, [email or web address] Phone [phone] 5:00 PM

## 2020-12-08 ENCOUNTER — Other Ambulatory Visit: Payer: Self-pay

## 2020-12-08 ENCOUNTER — Emergency Department: Payer: Medicare Other

## 2020-12-08 ENCOUNTER — Emergency Department
Admission: EM | Admit: 2020-12-08 | Discharge: 2020-12-08 | Disposition: A | Discharge status: Home / Self Care | Payer: Medicare Other | Attending: Student in an Organized Health Care Education/Training Program | Admitting: Student in an Organized Health Care Education/Training Program

## 2020-12-08 DIAGNOSIS — I517 Cardiomegaly: Secondary | ICD-10-CM | POA: Diagnosis not present

## 2020-12-08 DIAGNOSIS — R531 Weakness: Secondary | ICD-10-CM | POA: Insufficient documentation

## 2020-12-08 DIAGNOSIS — Z951 Presence of aortocoronary bypass graft: Secondary | ICD-10-CM | POA: Diagnosis not present

## 2020-12-08 DIAGNOSIS — I2510 Atherosclerotic heart disease of native coronary artery without angina pectoris: Secondary | ICD-10-CM | POA: Diagnosis not present

## 2020-12-08 DIAGNOSIS — J449 Chronic obstructive pulmonary disease, unspecified: Secondary | ICD-10-CM | POA: Diagnosis not present

## 2020-12-08 DIAGNOSIS — I110 Hypertensive heart disease with heart failure: Secondary | ICD-10-CM | POA: Diagnosis not present

## 2020-12-08 DIAGNOSIS — E119 Type 2 diabetes mellitus without complications: Secondary | ICD-10-CM | POA: Insufficient documentation

## 2020-12-08 DIAGNOSIS — Z8616 Personal history of COVID-19: Secondary | ICD-10-CM | POA: Insufficient documentation

## 2020-12-08 DIAGNOSIS — Z79899 Other long term (current) drug therapy: Secondary | ICD-10-CM | POA: Insufficient documentation

## 2020-12-08 DIAGNOSIS — Z8546 Personal history of malignant neoplasm of prostate: Secondary | ICD-10-CM | POA: Insufficient documentation

## 2020-12-08 DIAGNOSIS — E860 Dehydration: Secondary | ICD-10-CM | POA: Diagnosis not present

## 2020-12-08 DIAGNOSIS — Z743 Need for continuous supervision: Secondary | ICD-10-CM | POA: Diagnosis not present

## 2020-12-08 DIAGNOSIS — R404 Transient alteration of awareness: Secondary | ICD-10-CM | POA: Diagnosis not present

## 2020-12-08 DIAGNOSIS — R42 Dizziness and giddiness: Secondary | ICD-10-CM | POA: Diagnosis not present

## 2020-12-08 DIAGNOSIS — I5032 Chronic diastolic (congestive) heart failure: Secondary | ICD-10-CM | POA: Insufficient documentation

## 2020-12-08 DIAGNOSIS — Z7982 Long term (current) use of aspirin: Secondary | ICD-10-CM | POA: Diagnosis not present

## 2020-12-08 LAB — BASIC METABOLIC PANEL
Anion gap: 9 (ref 5–15)
BUN: 19 mg/dL (ref 8–23)
CO2: 23 mmol/L (ref 22–32)
Calcium: 9.2 mg/dL (ref 8.9–10.3)
Chloride: 104 mmol/L (ref 98–111)
Creatinine, Ser: 0.82 mg/dL (ref 0.61–1.24)
GFR, Estimated: >60 mL/min (ref >60)
Glucose, Bld: 119 mg/dL — ABNORMAL HIGH (ref 70–99)
Potassium: 3.9 mmol/L (ref 3.5–5.1)
Sodium: 136 mmol/L (ref 135–145)

## 2020-12-08 LAB — CBC
HCT: 43.5 % (ref 39.0–52.0)
Hemoglobin: 13.8 g/dL (ref 13.0–17.0)
MCH: 27.7 pg (ref 26.0–34.0)
MCHC: 31.7 g/dL (ref 30.0–36.0)
MCV: 87.2 fL (ref 80.0–100.0)
Platelets: 244 10*3/uL (ref 150–400)
RBC: 4.99 MIL/uL (ref 4.22–5.81)
RDW: 15.9 % — ABNORMAL HIGH (ref 11.5–15.5)
WBC: 8.7 10*3/uL (ref 4.0–10.5)
nRBC: 0 % (ref 0.0–0.2)

## 2020-12-08 LAB — URINALYSIS, COMPLETE (UACMP) WITH MICROSCOPIC
Bacteria, UA: NONE SEEN
Bilirubin Urine: NEGATIVE
Glucose, UA: NEGATIVE mg/dL
Hgb urine dipstick: NEGATIVE
Ketones, ur: NEGATIVE mg/dL
Leukocytes,Ua: NEGATIVE
Nitrite: NEGATIVE
Protein, ur: NEGATIVE mg/dL
Specific Gravity, Urine: 1.019 (ref 1.005–1.030)
Squamous Epithelial / HPF: NONE SEEN (ref 0–5)
pH: 6 (ref 5.0–8.0)

## 2020-12-08 LAB — TROPONIN I (HIGH SENSITIVITY)
Troponin I (High Sensitivity): 18 ng/L — ABNORMAL HIGH (ref <18)
Troponin I (High Sensitivity): 21 ng/L — ABNORMAL HIGH (ref <18)

## 2020-12-08 IMAGING — DX DG CHEST 1V PORT
1 series · 1 of 1 positions shown · non-contrast
Comparison: [DATE]

CLINICAL DATA: Dizziness

EXAM:
PORTABLE CHEST 1 VIEW

[chest ap]
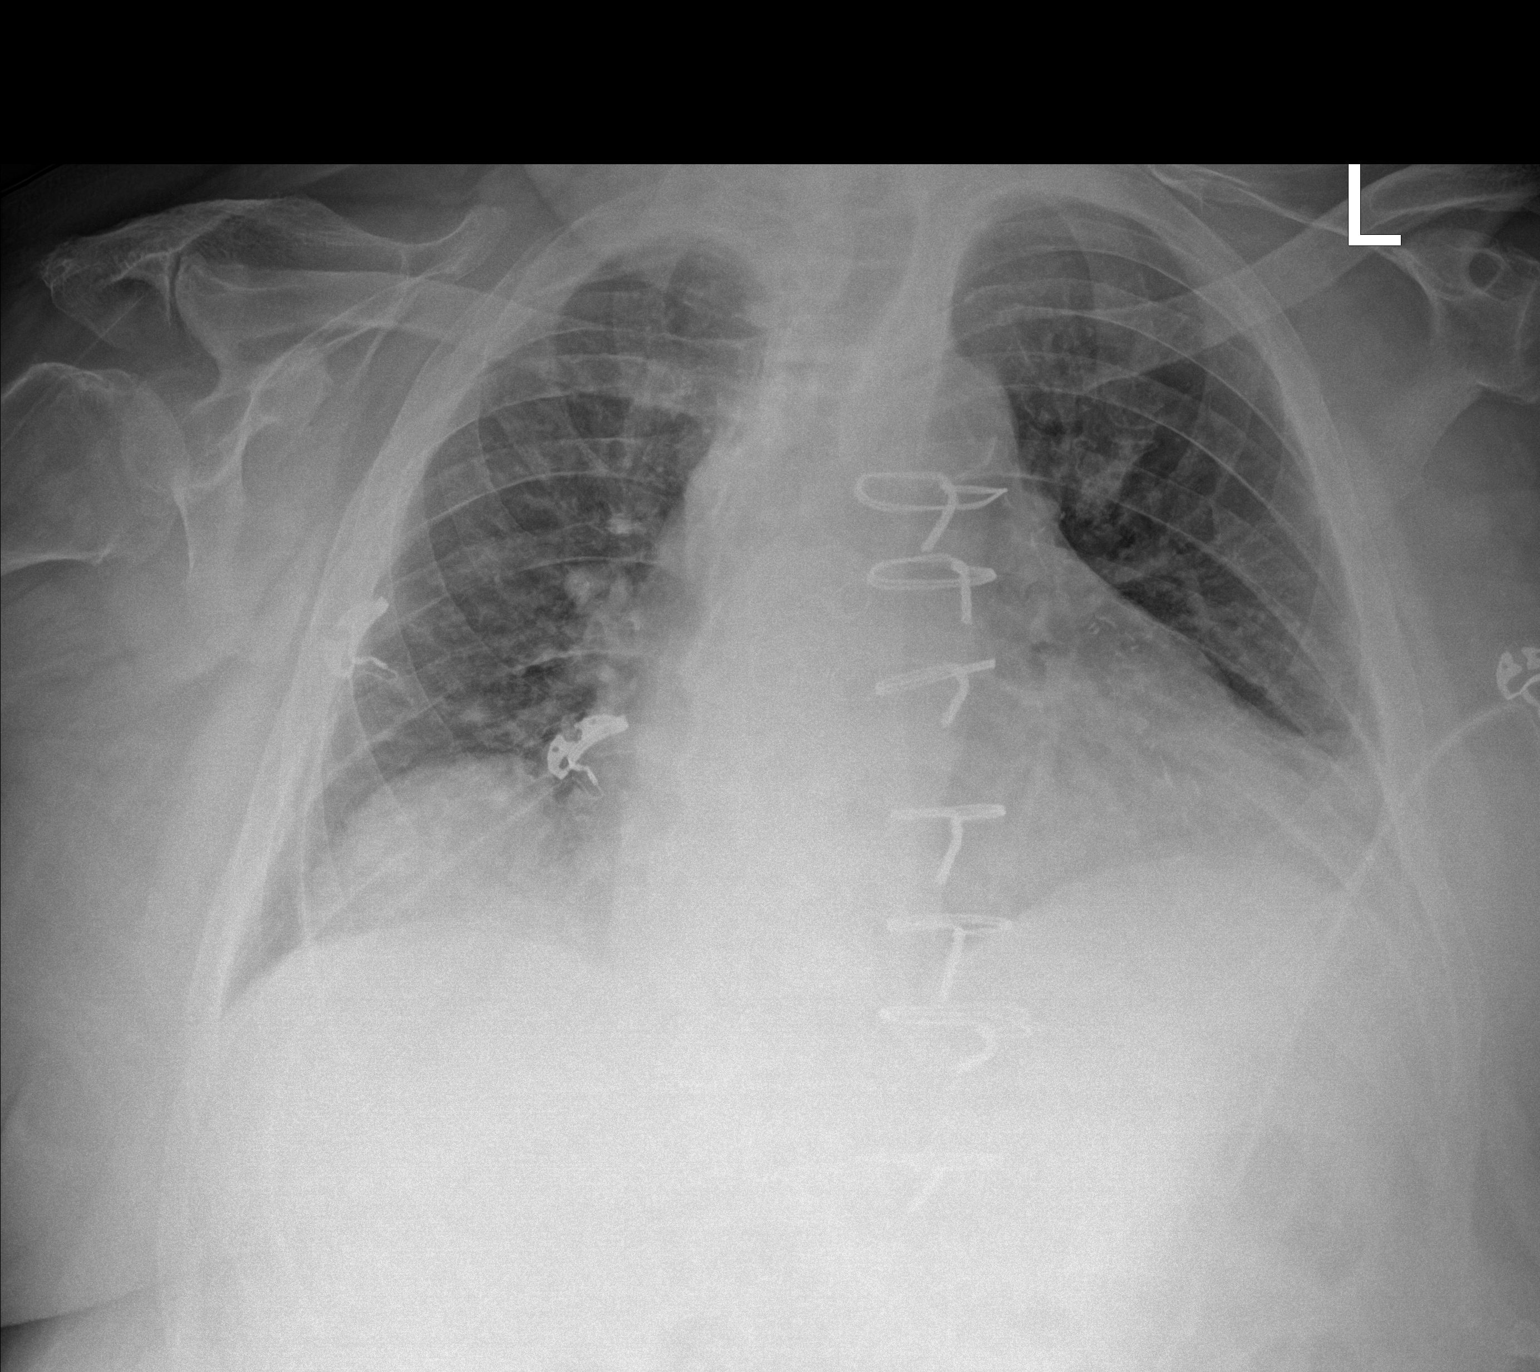

[1 of 1 positions shown; findings below may reference images not displayed]

FINDINGS: Lung volumes are small, but are symmetric. Eventration of the right
hemidiaphragm is again noted. There is resultant vascular crowding
at the hila. No definite superimposed focal pulmonary infiltrate. No
pneumothorax or pleural effusion. Mild cardiomegaly is unchanged.
Coronary artery bypass grafting has been performed. No acute bone
abnormality.
IMPRESSION: Pulmonary hypoinflation.

## 2020-12-08 MED ORDER — SODIUM CHLORIDE 0.9 % IV BOLUS
500.0000 mL | Freq: Once | INTRAVENOUS | Status: AC
  Administered 2020-12-08: 500 mL via INTRAVENOUS

## 2020-12-08 NOTE — Telephone Encounter (Signed)
Patient has been rescheduled.

## 2020-12-08 NOTE — ED Triage Notes (Signed)
Pt to ED for multiple complaints. States his "legs have been bleeding", has had weakness in legs and dizziness for "awhile" and a sinus headache.  No bleeding noted Pt able to move all extremities.  Clear speech, alert and oriented

## 2020-12-08 NOTE — ED Provider Notes (Signed)
[facility] Emergency Department Provider Note    Event Date/Time   First MD Initiated Contact with Patient 12/08/20 1638     (approximate)  I have reviewed the triage vital signs and the nursing notes.   HISTORY  Chief Complaint Weakness    HPI Evan [name] is a 65 y.o. male Libby Maw the ER for evaluation of lightheadedness and dizziness and worried that he is dehydrated.  Denies any numbness or tingling.  No headache.  No medication changes.  No shortness of breath.  No chest pain or pressure.  Been compliant with his medications.  Feels very different from his previous visit admission.  Does not feel consistent with stroke symptoms.  Does not feel like his previous heart attacks.   Past Medical History:  Diagnosis Date  . Anginal pain ([place])   . Anxiety disorder   . Asthma   . Atresia of esophagus without fistula   . CAD (coronary artery disease)   . Cellulitis   . CHF (congestive heart failure) ([place])    NYHA CLASS III,CHRONIC,DIASTOLIC  . COPD (chronic obstructive pulmonary disease) ([place])   . COVID-19   . Diabetes mellitus without complication ([place])   . Edema    RIGHT LOWER LEG  . Gastroesophageal reflux   . H/O: GI bleed   . History of pneumonia    Remote  . History of scarlet fever    Childhood  . Hyperlipidemia   . Hypertension   . Myocardial infarction ([place]) 2009  . Obesity   . Obstructive sleep apnea   . Pain    CHRONIC BACK / ABDOMINAL  . Panic disorder   . Peripheral venous insufficiency   . PTSD (post-traumatic stress disorder)   . Retinopathy    DIABETIC  . Stasis, venous   . Stroke ([place])   . Vertigo    Family History  Problem Relation Age of Onset  . Heart attack Mother   . Hypertension Mother   . Hyperlipidemia Mother   . Heart attack Brother 69       MI  . Coronary artery disease Other    Past Surgical History:  Procedure Laterality Date  . CARDIAC CATHETERIZATION    . CATARACT EXTRACTION Left   .  CATARACT EXTRACTION W/PHACO Right 05/03/2017   Procedure: CATARACT EXTRACTION PHACO AND INTRAOCULAR LENS PLACEMENT (IOC);  Surgeon: Leandrew Koyanagi, MD;  Location: [facility] ORS;  Service: Ophthalmology;  Laterality: Right;  [place] 00:35.3 AP% 12.3 CDE 4.33 Fluid Pack lot # [id number]       . COLONOSCOPY WITH PROPOFOL N/A 11/26/2020   Procedure: COLONOSCOPY WITH PROPOFOL;  Surgeon: Jonathon Bellows, MD;  Location: [facility] ENDOSCOPY;  Service: Gastroenterology;  Laterality: N/A;  ANNETTE TO PICK UP [phone]  . CORONARY ANGIOPLASTY WITH STENT PLACEMENT  2002  . CORONARY ANGIOPLASTY WITH STENT PLACEMENT  1999  . CORONARY ARTERY BYPASS GRAFT     x7  . ESOPHAGOGASTRODUODENOSCOPY N/A 09/19/2016   Procedure: ESOPHAGOGASTRODUODENOSCOPY (EGD);  Surgeon: Lollie Sails, MD;  Location[facility] ENDOSCOPY;  Service: Endoscopy;  Laterality: N/A;  . ESOPHAGOGASTRODUODENOSCOPY (EGD) WITH PROPOFOL  11/26/2020   Procedure: ESOPHAGOGASTRODUODENOSCOPY (EGD) WITH PROPOFOL;  Surgeon: Jonathon Bellows, MD;  Location: [facility] ENDOSCOPY;  Service: Gastroenterology;;   Patient Active Problem List   Diagnosis Date Noted  . Vitamin D deficiency 12/04/2020  . Vitamin B12 deficiency 12/04/2020  . Stenosis of right carotid artery 12/03/2020  . Acute CVA (cerebrovascular accident) ([place]) 11/06/2020  . History of 2019 novel coronavirus disease (  COVID-19) 09/27/2020  . Atherosclerosis of aorta ([place]) 12/04/2019  . Persistent atrial fibrillation ([place]) 09/12/2019  . CAD (coronary artery disease) 09/12/2019  . Chronic venous stasis 09/12/2019  . Hoarding disorder 09/12/2019  . Cervical spinal stenosis 09/12/2019  . Diabetic retinopathy ([place]) 09/12/2019  . COPD, mild ([place]) 09/12/2019  . Osteoporosis 09/09/2019  . History of prostate cancer 09/09/2019  . Anxiety 08/10/2019  . Obstructive sleep apnea on CPAP 08/10/2019  . Hyperlipidemia associated with type 2 diabetes mellitus ([place]) 08/10/2019  . Chronic diastolic CHF (congestive heart  failure) ([place]) 01/09/2014  . Esophageal dysmotility 09/12/2013  . Type 2 diabetes mellitus with morbid obesity ([place]) 02/14/2012  . Obesity, morbid (more than 100 lbs over ideal weight or BMI > 40) ([place]) 07/26/2011  . OLD MYOCARDIAL INFARCTION 03/02/2010  . Hypertension associated with diabetes ([place]) 03/20/2009  . CORONARY ATHEROSCLEROSIS, ARTERY BYPASS GRAFT 03/20/2009      Prior to Admission medications   Medication Sig Start Date End Date Taking? Authorizing Provider  acetaminophen (TYLENOL) 500 MG tablet Take 500 mg by mouth every 6 (six) hours as needed.    [provider]  aspirin 81 MG chewable tablet Chew 1 tablet (81 mg total) by mouth daily. 11/23/20   [name], Henrine Screws T, NP  atorvastatin (LIPITOR) 80 MG tablet Take 1 tablet (80 mg total) by mouth daily. 11/26/20 02/19/22  [name], Henrine Screws T, NP  Blood Glucose Monitoring Suppl (ONETOUCH VERIO) w/Device KIT Use to check blood sugar 3 to 4 times a day and document.  Please bring to visits for review. 07/08/20   [name], Henrine Screws T, NP  budesonide-formoterol (SYMBICORT) 80-4.5 MCG/ACT inhaler Inhale 2 puffs into the lungs 2 (two) times daily. 09/24/20   [name] T, NP  Cholecalciferol 125 MCG (5000 UT) TABS Take 1 tablet (5,000 Units total) by mouth daily. 12/04/20   [name], NP  Elastic Bandages & Supports (MEDICAL COMPRESSION STOCKINGS) MISC 1 Package by Does not apply route daily. 08/28/20   Sable Feil, PA-C  empagliflozin (JARDIANCE) 10 MG TABS tablet Take 1 tablet (10 mg total) by mouth daily. 07/08/20   [name], Henrine Screws T, NP  ezetimibe (ZETIA) 10 MG tablet Take 1 tablet (10 mg total) by mouth daily. 10/07/20   [name], Henrine Screws T, NP  furosemide (LASIX) 40 MG tablet TAKE 2 TABLETS BY MOUTH ONCE EVERY MORNING AND 1 TABLET ONCE EVERY EVENING 05/18/20   [name], [name] T, NP  glucose blood (ONETOUCH VERIO) test strip Use to check blood sugar 3 to 4 times a day. 07/08/20   [name], Henrine Screws T, NP  metoprolol succinate  (TOPROL XL) 50 MG 24 hr tablet Take 1 tablet (50 mg total) by mouth daily. 07/08/20   [name], Henrine Screws T, NP  nitroGLYCERIN (NITROSTAT) 0.4 MG SL tablet Place 1 tablet (0.4 mg total) under the tongue every 5 (five) minutes as needed for chest pain. 10/10/19   [name], Henrine Screws T, NP  olmesartan (BENICAR) 5 MG tablet Take 2 tablets (10 mg total) by mouth daily. 12/03/20   [name], Henrine Screws T, NP  Omega-3 Fatty Acids (OMEGA 3 500 PO) Take 500 mg by mouth daily.    [provider]  OneTouch Delica Lancets 60V MISC Use to check blood sugar 2-3 times a day. 09/29/20   [name], Henrine Screws T, NP  pantoprazole (PROTONIX) 40 MG tablet Take 1 tablet (40 mg total) by mouth daily. 04/08/20   [name], Henrine Screws T, NP  risperiDONE (RISPERDAL) 0.5 MG tablet Take 1 tablet (0.5 mg total) by  mouth 2 (two) times daily. 07/08/20   [name], Henrine Screws T, NP  rivaroxaban (XARELTO) 20 MG TABS tablet Take 1 tablet (20 mg total) by mouth at bedtime. 07/08/20   [name] T, NP  sharps container 1 each by Does not apply route as needed. 09/29/20   [name], NP  Skin Protectants, Misc. (EUCERIN) cream Apply topically as needed for dry skin. 08/28/20   Sable Feil, PA-C  triamcinolone (KENALOG) 0.1 % APPLY TO AFFECTED AREA(s) TWICE DAILY 10/18/20   [name], Henrine Screws T, NP  vitamin B-12 (CYANOCOBALAMIN) 1000 MCG tablet Take 1 tablet (1,000 mcg total) by mouth daily. 12/04/20   [name] T, NP    Allergies Penicillin g, Sulfa antibiotics, Tiotropium, and Zoloft [sertraline hcl]    Social History Social History   Tobacco Use  . Smoking status: Never Smoker  . Smokeless tobacco: Never Used  Vaping Use  . Vaping Use: Never used  Substance Use Topics  . Alcohol use: No  . Drug use: No    Review of Systems Patient denies headaches, rhinorrhea, blurry vision, numbness, shortness of breath, chest pain, edema, cough, abdominal pain, nausea, vomiting, diarrhea, dysuria, fevers, rashes or hallucinations unless otherwise  stated above in HPI. ____________________________________________   PHYSICAL EXAM:  VITAL SIGNS: Vitals:   12/08/20 1845 12/08/20 1900  BP:  (!) 151/88  Pulse: 71 63  Resp:  20  Temp:    SpO2: 96% 99%    Constitutional: Alert and oriented.  Eyes: Conjunctivae are normal.  Head: Atraumatic. Nose: No congestion/rhinnorhea. Mouth/Throat: Mucous membranes are moist.   Neck: No stridor. Painless ROM.  Cardiovascular: Normal rate, regular rhythm. Grossly normal heart sounds.  Good peripheral circulation. Respiratory: Normal respiratory effort.  No retractions. Lungs CTAB. Gastrointestinal: Soft and nontender. No distention. No abdominal bruits. No CVA tenderness. Genitourinary:  Musculoskeletal: No lower extremity tenderness nor edema.  No joint effusions. Neurologic:  Normal speech and language. No gross focal neurologic deficits are appreciated. No facial droop Skin:  Skin is warm, dry and intact. No rash noted. Psychiatric: somewhat odd. Speech and behavior are normal.  ____________________________________________   LABS (all labs ordered are listed, but only abnormal results are displayed)  Results for orders placed or performed during the hospital encounter of 12/08/20 (from the past 24 hour(s))  Basic metabolic panel     Status: Abnormal   Collection Time: 12/08/20  3:35 PM  Result Value Ref Range   Sodium 136 135 - 145 mmol/L   Potassium 3.9 3.5 - 5.1 mmol/L   Chloride 104 98 - 111 mmol/L   CO2 23 22 - 32 mmol/L   Glucose, Bld 119 (H) 70 - 99 mg/dL   BUN 19 8 - 23 mg/dL   Creatinine, Ser 0.82 0.61 - 1.24 mg/dL   Calcium 9.2 8.9 - 10.3 mg/dL   GFR, Estimated >60 >60 mL/min   Anion gap 9 5 - 15  CBC     Status: Abnormal   Collection Time: 12/08/20  3:35 PM  Result Value Ref Range   WBC 8.7 4.0 - 10.5 K/uL   RBC 4.99 4.22 - 5.81 MIL/uL   Hemoglobin 13.8 13.0 - 17.0 g/dL   HCT 43.5 39.0 - 52.0 %   MCV 87.2 80.0 - 100.0 fL   MCH 27.7 26.0 - 34.0 pg   MCHC 31.7  30.0 - 36.0 g/dL   RDW 15.9 (H) 11.5 - 15.5 %   Platelets 244 150 - 400 K/uL   nRBC 0.0 0.0 -  0.2 %  Urinalysis, Complete w Microscopic     Status: Abnormal   Collection Time: 12/08/20  4:56 PM  Result Value Ref Range   Color, Urine YELLOW (A) YELLOW   APPearance CLEAR (A) CLEAR   Specific Gravity, Urine 1.019 1.005 - 1.030   pH 6.0 5.0 - 8.0   Glucose, UA NEGATIVE NEGATIVE mg/dL   Hgb urine dipstick NEGATIVE NEGATIVE   Bilirubin Urine NEGATIVE NEGATIVE   Ketones, ur NEGATIVE NEGATIVE mg/dL   Protein, ur NEGATIVE NEGATIVE mg/dL   Nitrite NEGATIVE NEGATIVE   Leukocytes,Ua NEGATIVE NEGATIVE   RBC / HPF 0-5 0 - 5 RBC/hpf   WBC, UA 0-5 0 - 5 WBC/hpf   Bacteria, UA NONE SEEN NONE SEEN   Squamous Epithelial / LPF NONE SEEN 0 - 5  Troponin I (High Sensitivity)     Status: Abnormal   Collection Time: 12/08/20  5:05 PM  Result Value Ref Range   Troponin I (High Sensitivity) 18 (H) <18 ng/L  Troponin I (High Sensitivity)     Status: Abnormal   Collection Time: 12/08/20  7:44 PM  Result Value Ref Range   Troponin I (High Sensitivity) 21 (H) <18 ng/L   ____________________________________________  EKG My review and personal interpretation at Time: 17:14   Indication: dizziness  Rate: 80  Rhythm: afib Axis: normal Other: normal intervals, no stemi ____________________________________________  RADIOLOGY  I personally reviewed all radiographic images ordered to evaluate for the above acute complaints and reviewed radiology reports and findings.  These findings were personally discussed with the patient.  Please see medical record for radiology report.  ____________________________________________   PROCEDURES  Procedure(s) performed:  Procedures    Critical Care performed: no ____________________________________________   INITIAL IMPRESSION / ASSESSMENT AND PLAN / ED COURSE  Pertinent labs & imaging results that were available during my care of the patient were reviewed by  me and considered in my medical decision making (see chart for details).   DDX: Dehydration, electrolyte abnormality, dysrhythmia, CHF, ACS, TIA, CVA  Evan [name] is a 65 y.o. who presents to the ED with presentation as described above.  Patient clinically well-appearing.  Denies any pain or pressure.  EKG with no evidence of acute ischemia.  Nonspecific T wave changes.  Will order serial enzymes.  Denies any chest pain or pressure.  Have a lower suspicion for ACS.  Patient feels like he is dehydrated will give IV fluids and reassess.  Neuro exam is nonfocal.  Does not seem consistent with CVA.  No trauma.  Clinical Course as of 12/08/20 2039  Wed Dec 08, 2020  1942 Patient feeling much improved.  Denies any chest pain or pressure.  We will repeat cardiac enzyme. [PR]  2038 Repeat troponin negative.  Patient feels much improved after IV fluids and requesting discharge home.  No other symptoms or concerns.  Does appear appropriate for outpatient follow-up [PR]    Clinical Course User Index [PR] Merlyn Lot, MD    The patient was evaluated in Emergency Department today for the symptoms described in the history of present illness. He/she was evaluated in the context of the global COVID-19 pandemic, which necessitated consideration that the patient might be at risk for infection with the SARS-CoV-2 virus that causes COVID-19. Institutional protocols and algorithms that pertain to the evaluation of patients at risk for COVID-19 are in a state of rapid change based on information released by regulatory bodies including the CDC and federal and state organizations. These policies and  algorithms were followed during the patient's care in the ED.  As part of my medical decision making, I reviewed the following data within the [facility] notes reviewed and incorporated, Labs reviewed, notes from prior ED visits and [place] Controlled Substance  Database   ____________________________________________   FINAL CLINICAL IMPRESSION(S) / ED DIAGNOSES  Final diagnoses:  Dehydration  Dizziness      NEW MEDICATIONS STARTED DURING THIS VISIT:  New Prescriptions   No medications on file     Note:  This document was prepared using Dragon voice recognition software and may include unintentional dictation errors.    Merlyn Lot, MD 12/08/20 2039

## 2020-12-08 NOTE — ED Triage Notes (Signed)
Arrives via [facility].c/o dizziness, dry mouth, tongue hurting and sneezing a lot.  VS wnl

## 2020-12-09 DIAGNOSIS — Z5189 Encounter for other specified aftercare: Secondary | ICD-10-CM | POA: Diagnosis not present

## 2020-12-14 ENCOUNTER — Telehealth: Payer: Self-pay | Admitting: Pharmacist

## 2020-12-14 NOTE — Chronic Care Management (AMB) (Signed)
Chronic Care Management Pharmacy Assistant   Name: Evan [name]  MRN: [id number] DOB: [birth year]  Reason for Encounter: Disease State-Cholesterol    Recent office visits:  12/03/20- Evan Guarneri, NP (PCP)-start olmesartan 10 mg, start Vitamin D and B12, referral to neurology and vascular surgery  Recent consult visits:  None noted  Hospital visits:  Medication Reconciliation was completed by comparing discharge summary, patient's EMR and Pharmacy list, and upon discussion with patient.  Admitted to the hospital on 12/08/20 due to Dehydration. Discharge date was 12/08/20. Discharged from [facility].    New?Medications Started at [facility] Discharge:?? -started none  Medication Changes at Hospital Discharge: -Changed none  Medications Discontinued at Hospital Discharge: -Stopped none  Medications that remain the same after Hospital Discharge:??  -All other medications will remain the same.    Medications: Outpatient Encounter Medications as of 12/14/2020  Medication Sig  . acetaminophen (TYLENOL) 500 MG tablet Take 500 mg by mouth every 6 (six) hours as needed.  Marland Kitchen aspirin 81 MG chewable tablet Chew 1 tablet (81 mg total) by mouth daily.  Marland Kitchen atorvastatin (LIPITOR) 80 MG tablet Take 1 tablet (80 mg total) by mouth daily.  . Blood Glucose Monitoring Suppl (ONETOUCH VERIO) w/Device KIT Use to check blood sugar 3 to 4 times a day and document.  Please bring to visits for review.  . budesonide-formoterol (SYMBICORT) 80-4.5 MCG/ACT inhaler Inhale 2 puffs into the lungs 2 (two) times daily.  . Cholecalciferol 125 MCG (5000 UT) TABS Take 1 tablet (5,000 Units total) by mouth daily.  Evan [name] Bandages & Supports (MEDICAL COMPRESSION STOCKINGS) MISC 1 Package by Does not apply route daily.  . empagliflozin (JARDIANCE) 10 MG TABS tablet Take 1 tablet (10 mg total) by mouth daily.  Marland Kitchen ezetimibe (ZETIA) 10 MG tablet Take 1 tablet (10 mg total) by mouth daily.  . furosemide (LASIX)  40 MG tablet TAKE 2 TABLETS BY MOUTH ONCE EVERY MORNING AND 1 TABLET ONCE EVERY EVENING  . glucose blood (ONETOUCH VERIO) test strip Use to check blood sugar 3 to 4 times a day.  . metoprolol succinate (TOPROL XL) 50 MG 24 hr tablet Take 1 tablet (50 mg total) by mouth daily.  . nitroGLYCERIN (NITROSTAT) 0.4 MG SL tablet Place 1 tablet (0.4 mg total) under the tongue every 5 (five) minutes as needed for chest pain.  Marland Kitchen olmesartan (BENICAR) 5 MG tablet Take 2 tablets (10 mg total) by mouth daily.  . Omega-3 Fatty Acids (OMEGA 3 500 PO) Take 500 mg by mouth daily.  Evan [name] 91Q MISC Use to check blood sugar 2-3 times a day.  . pantoprazole (PROTONIX) 40 MG tablet Take 1 tablet (40 mg total) by mouth daily.  . risperiDONE (RISPERDAL) 0.5 MG tablet Take 1 tablet (0.5 mg total) by mouth 2 (two) times daily.  . rivaroxaban (XARELTO) 20 MG TABS tablet Take 1 tablet (20 mg total) by mouth at bedtime.  . sharps container 1 each by Does not apply route as needed.  . Skin Protectants, Misc. (EUCERIN) cream Apply topically as needed for dry skin.  Marland Kitchen triamcinolone (KENALOG) 0.1 % APPLY TO AFFECTED AREA(s) TWICE DAILY  . vitamin B-12 (CYANOCOBALAMIN) 1000 MCG tablet Take 1 tablet (1,000 mcg total) by mouth daily.   No facility-administered encounter medications on file as of 12/14/2020.   12/14/2020 Name: Evan [name] MRN: [id number] DOB: 11-28-1955 Evan [name] is a 65 y.o. year old male who is a primary care patient of  Evan Lick, NP.  Comprehensive medication review performed; Spoke to patient regarding cholesterol  Lipid Panel    Component Value Date/Time   CHOL 199 12/03/2020 1350   CHOL 194 12/30/2013 0413   TRIG 144 12/03/2020 1350   TRIG 158 12/30/2013 0413   HDL 51 12/03/2020 1350   HDL 42 12/30/2013 0413   LDLCALC 122 (H) 12/03/2020 1350   LDLCALC 120 (H) 12/30/2013 0413   LDLDIRECT 178.5 07/26/2011 1234    10-year ASCVD risk score: The ASCVD Risk score Mikey Bussing  DC Jr., et al., 2013) failed to calculate for the following reasons:   The patient has a prior MI or stroke diagnosis  . Current antihyperlipidemic regimen:   Lipitor 80 mg qd  Asa 81 mg qd  Zetia 10 mg qd  . Previous antihyperlipidemic medications tried: Rosuvastatin  . ASCVD risk enhancing conditions: age >10, DM and HTN   . What recent interventions/DTPs have been made by any provider to improve Cholesterol control since last CPP Visit: None noted  . Any recent hospitalizations or ED visits since last visit with CPP? Yes   . What diet changes have been made to improve Cholesterol?  o Patient states he has cut out sugar and limits salt intake.  . What exercise is being done to improve Cholesterol?  o Patient states he tries to walk as much as possible  Adherence Review: Does the patient have >5 day gap between last estimated fill dates? No     Star Rating Drugs: Atorvastatin 80 mg last filled 12/02/20 30 DS Jardiance 10 mg last filled 11/23/20 90 DS olmesartan 5 mg last filled 12/03/20 90 DS  [facility] Clinical Pharmacist Assistant [phone]

## 2020-12-20 ENCOUNTER — Ambulatory Visit: Payer: Self-pay | Admitting: *Deleted

## 2020-12-20 NOTE — Telephone Encounter (Signed)
Please schedule for sooner if worsening edema or discoloration, has some at baseline with his PVD.  Also Delana Meyer can you reach out to him about caregiver, Jerene Pitch worked with this patient a lot.

## 2020-12-20 NOTE — Telephone Encounter (Signed)
Patient called to report multiple c/o. Reported he has swelling in his legs and discolored blue per agent. Patient reports leg swelling and discoloration is not new and is ongoing and no worse. Patient hx stroke and required multiple times of redirection of his request for calling. STMD noted and reports he wants a new caregiver than "Annette". Patient reports caregiver will not take him to appt and the help that is sent will not "show up' or assist him with care. Reports medical equipment sent to his home is missing pulse oximeter. Packages are tampered with due to his neighborhood. Requesting assistance to get a new caregiver named Tacy Dura, [place] [phone] fax [phone]. Requesting case manager to assist with transportation needs from [place] for upcoming appt 12/31/20 with PCP. Requesting case manager look in to life alert information and set up. [employer] needs to hear from PCP to get set up of a new caregiver for help with his physical needs. Patient has had letter sent to him from "housing committee" to clean his front yard and he is unable to physically complete it. Patient c/o he can not utilize his my Chart account since his stroke and would like information on future appt resend to him due to "caregiver" threw them away. Attempted to review up coming appt with patient and he reports he is unable to write that much information down. Patient denies any specific physical c/o at this time other than on going issues of headaches, swelling in legs and STMD. No new c/o or worsening symptoms. Care advise given. Patient verbalized understanding of care advise and to call back or go to ED for any new or worsening symptoms. Patient requesting call back from case manager to assist with caregiver issues.  Please advise.   Reason for Disposition . [1] Caller requesting NON-URGENT health information AND [2] PCP's office is the best resource  Answer Assessment - Initial  Assessment Questions 1. ONSET: "When did the swelling start?" (e.g., minutes, hours, days)     *No Answer* 2. LOCATION: "What part of the leg is swollen?"  "Are both legs swollen or just one leg?"     *No Answer* 3. SEVERITY: "How bad is the swelling?" (e.g., localized; mild, moderate, severe)  - Localized - small area of swelling localized to one leg  - MILD pedal edema - swelling limited to foot and ankle, pitting edema < 1/4 inch (6 mm) deep, rest and elevation eliminate most or all swelling  - MODERATE edema - swelling of lower leg to knee, pitting edema > 1/4 inch (6 mm) deep, rest and elevation only partially reduce swelling  - SEVERE edema - swelling extends above knee, facial or hand swelling present      *No Answer* 4. REDNESS: "Does the swelling look red or infected?"     *No Answer* 5. PAIN: "Is the swelling painful to touch?" If Yes, ask: "How painful is it?"   (Scale 1-10; mild, moderate or severe)     *No Answer* 6. FEVER: "Do you have a fever?" If Yes, ask: "What is it, how was it measured, and when did it start?"      *No Answer* 7. CAUSE: "What do you think is causing the leg swelling?"     *No Answer* 8. MEDICAL HISTORY: "Do you have a history of heart failure, kidney disease, liver failure, or cancer?"     *No Answer* 9. RECURRENT SYMPTOM: "Have you had leg swelling before?" If Yes, ask: "When was the  last time?" "What happened that time?"     *No Answer* 10. OTHER SYMPTOMS: "Do you have any other symptoms?" (e.g., chest pain, difficulty breathing)       *No Answer* 11. PREGNANCY: "Is there any chance you are pregnant?" "When was your last menstrual period?"       *No Answer*  Answer Assessment - Initial Assessment Questions 1. REASON FOR CALL or QUESTION: "What is your reason for calling today?" or "How can I best help you?" or "What question do you have that I can help answer?"     Patient reported multiple on going symptoms and c/o caregiver support. Patient asking  for a different caregiver  Protocols used: INFORMATION ONLY CALL - NO TRIAGE-A-AH, LEG SWELLING AND EDEMA-A-AH

## 2020-12-21 NOTE — Telephone Encounter (Signed)
Pt stated his legs are doing better and is still waiting for care team to reach out to him the patient declined sooner apt.

## 2020-12-25 ENCOUNTER — Encounter: Payer: Self-pay | Admitting: Nurse Practitioner

## 2020-12-25 DIAGNOSIS — D6869 Other thrombophilia: Secondary | ICD-10-CM | POA: Insufficient documentation

## 2020-12-27 ENCOUNTER — Ambulatory Visit (INDEPENDENT_AMBULATORY_CARE_PROVIDER_SITE_OTHER): Payer: Medicare Other | Admitting: Licensed Clinical Social Worker

## 2020-12-27 DIAGNOSIS — E1169 Type 2 diabetes mellitus with other specified complication: Secondary | ICD-10-CM

## 2020-12-27 DIAGNOSIS — F419 Anxiety disorder, unspecified: Secondary | ICD-10-CM

## 2020-12-28 ENCOUNTER — Telehealth: Payer: Self-pay | Admitting: Nurse Practitioner

## 2020-12-28 ENCOUNTER — Telehealth: Payer: Self-pay

## 2020-12-28 NOTE — Telephone Encounter (Signed)
Copied from [place] [phone]. Topic: Appointment Scheduling - Scheduling Inquiry for Clinic >> Dec 28, 2020  2:03 PM Oneta Rack wrote: Patient does not have transportation for Friday 12/31/2020 appointment and would like to know if it can be a telephone call, please advise

## 2020-12-28 NOTE — Telephone Encounter (Addendum)
Patient inquiring about coloscopy results. Also legs are swollen and feeling a little dehydrated. Patient states PCP recommend a cream but unsure if she will prescribe, please advise

## 2020-12-28 NOTE — Chronic Care Management (AMB) (Signed)
Chronic Care Management    Clinical Social Work Note  12/28/2020 Name: Evan [name] MRN: [id number] DOB: 11/14/1955  Evan [name] is a 65 y.o. year old male who is a primary care patient of [name], Evan Faster, NP. The CCM team was consulted to assist the patient with chronic disease management and/or care coordination needs related to: Level of Care Concerns and Mental Health Counseling and Resources.   Engaged with patient by telephone for follow up visit in response to provider referral for social work chronic care management and care coordination services.   Consent to Services:  The patient was given information about Chronic Care Management services, agreed to services, and gave verbal consent prior to initiation of services.  Please see initial visit note for detailed documentation.   Patient agreed to services and consent obtained.   Assessment: Patient is currently experiencing symptoms of  anxiety which seems to be exacerbated by the need of an in home aid. Patient was successful in identifying healthy strategies to assist with management of symptoms. CCM LCSW will reach out to patient's preferred agency to initiate aid services. See Care Plan below for interventions and patient self-care actives. Recent life changes /stressors: Need for a new aid Recommendation: Patient may benefit from, and is in agreement to continue utilizing healthy coping skills to assist with management of symptoms.  Follow up Plan: Patient would like continued follow-up.  CCM LCSW will follow up with patient witin 30 days. Patient will call office if needed prior to next encounter.   SDOH (Social Determinants of Health) assessments and interventions performed:    Advanced Directives Status: Not addressed in this encounter.  CCM Care Plan  Allergies  Allergen Reactions  . Penicillin G Hives  . Sulfa Antibiotics Hives  . Tiotropium   . Zoloft [Sertraline Hcl] Other (See Comments)    Outpatient  Encounter Medications as of 12/27/2020  Medication Sig  . acetaminophen (TYLENOL) 500 MG tablet Take 500 mg by mouth every 6 (six) hours as needed.  Marland Kitchen aspirin 81 MG chewable tablet Chew 1 tablet (81 mg total) by mouth daily.  Marland Kitchen atorvastatin (LIPITOR) 80 MG tablet Take 1 tablet (80 mg total) by mouth daily.  . Blood Glucose Monitoring Suppl (ONETOUCH VERIO) w/Device KIT Use to check blood sugar 3 to 4 times a day and document.  Please bring to visits for review.  . budesonide-formoterol (SYMBICORT) 80-4.5 MCG/ACT inhaler Inhale 2 puffs into the lungs 2 (two) times daily.  . Cholecalciferol 125 MCG (5000 UT) TABS Take 1 tablet (5,000 Units total) by mouth daily.  Regino Schultze Bandages & Supports (MEDICAL COMPRESSION STOCKINGS) MISC 1 Package by Does not apply route daily.  . empagliflozin (JARDIANCE) 10 MG TABS tablet Take 1 tablet (10 mg total) by mouth daily.  Marland Kitchen ezetimibe (ZETIA) 10 MG tablet Take 1 tablet (10 mg total) by mouth daily.  . furosemide (LASIX) 40 MG tablet TAKE 2 TABLETS BY MOUTH ONCE EVERY MORNING AND 1 TABLET ONCE EVERY EVENING  . glucose blood (ONETOUCH VERIO) test strip Use to check blood sugar 3 to 4 times a day.  . metoprolol succinate (TOPROL XL) 50 MG 24 hr tablet Take 1 tablet (50 mg total) by mouth daily.  . nitroGLYCERIN (NITROSTAT) 0.4 MG SL tablet Place 1 tablet (0.4 mg total) under the tongue every 5 (five) minutes as needed for chest pain.  Marland Kitchen olmesartan (BENICAR) 5 MG tablet Take 2 tablets (10 mg total) by mouth daily.  . Omega-3 Fatty  Acids (OMEGA 3 500 PO) Take 500 mg by mouth daily.  Glory Rosebush Delica Lancets 02B MISC Use to check blood sugar 2-3 times a day.  . pantoprazole (PROTONIX) 40 MG tablet Take 1 tablet (40 mg total) by mouth daily.  . risperiDONE (RISPERDAL) 0.5 MG tablet Take 1 tablet (0.5 mg total) by mouth 2 (two) times daily.  . rivaroxaban (XARELTO) 20 MG TABS tablet Take 1 tablet (20 mg total) by mouth at bedtime.  . sharps container 1 each by Does not  apply route as needed.  . Skin Protectants, Misc. (EUCERIN) cream Apply topically as needed for dry skin.  Marland Kitchen triamcinolone (KENALOG) 0.1 % APPLY TO AFFECTED AREA(s) TWICE DAILY  . vitamin B-12 (CYANOCOBALAMIN) 1000 MCG tablet Take 1 tablet (1,000 mcg total) by mouth daily.   No facility-administered encounter medications on file as of 12/27/2020.    Patient Active Problem List   Diagnosis Date Noted  . Other thrombophilia ([place]) 12/25/2020  . Vitamin D deficiency 12/04/2020  . Vitamin B12 deficiency 12/04/2020  . Stenosis of right carotid artery 12/03/2020  . History of CVA (cerebrovascular accident) 11/06/2020  . History of 2019 novel coronavirus disease (COVID-19) 09/27/2020  . Atherosclerosis of aorta ([place]) 12/04/2019  . Persistent atrial fibrillation ([place]) 09/12/2019  . CAD (coronary artery disease) 09/12/2019  . Chronic venous stasis 09/12/2019  . Hoarding disorder 09/12/2019  . Cervical spinal stenosis 09/12/2019  . Diabetic retinopathy ([place]) 09/12/2019  . COPD, mild ([place]) 09/12/2019  . Osteoporosis 09/09/2019  . History of prostate cancer 09/09/2019  . Anxiety 08/10/2019  . Obstructive sleep apnea on CPAP 08/10/2019  . Hyperlipidemia associated with type 2 diabetes mellitus ([place]) 08/10/2019  . Chronic diastolic CHF (congestive heart failure) ([place]) 01/09/2014  . Esophageal dysmotility 09/12/2013  . Type 2 diabetes mellitus with morbid obesity ([place]) 02/14/2012  . Obesity, morbid (more than 100 lbs over ideal weight or BMI > 40) ([place]) 07/26/2011  . OLD MYOCARDIAL INFARCTION 03/02/2010  . Hypertension associated with diabetes ([place]) 03/20/2009  . CORONARY ATHEROSCLEROSIS, ARTERY BYPASS GRAFT 03/20/2009    Conditions to be addressed/monitored: Anxiety; Level of care concerns and Mental Health Concerns   Care Plan : General Social Work (Adult)  Updates made by Rebekah Chesterfield, LCSW since 12/28/2020 12:00 AM    Problem: Response to Treatment (Depression)     Goal: Response  to Treatment Maximized   Start Date: 12/27/2020  This Visit's Progress: On track  Priority: High  Note:   Current barriers:   . Acute Mental Health needs related to Anxiety . Housing barriers, Level of care concerns, and Mental Health Concerns   Needs Support, Education, and Care Coordination in order to meet unmet mental health needs. Clinical Goal(s): Over the next 120 days, patient will work with SW to reduce or manage symptoms of anxiety and increase knowledge and/or ability of: coping skills, healthy habits, self-management skills, and stress reduction.until connected for ongoing counseling.  Clinical Interventions:  . Assessed patient's previous treatment, needs, coping skills, current treatment, support system and barriers to care . Patient interviewed and appropriate assessments performed . Provided mental health counseling with regard to managing anxiety . Patient reports that he is currently recovering from a stroke, which resulted in an increase in confusion and feelings of overwhelm . Patient is interested in obtaining a new caregiver. His previous aid was let go because of not showing up to work and talking to him inappropriately-CCM LCSW will contact new agency to initiate services . CCM LCSW  provided validation and encouragement. During visit, pt became overwhelmed due to misplacement of credit cards and check book. CCM LCSW practiced deep breathing with patient, who was able to retrieve items . Patient enjoys watching movies and drawing to cope with stressors . Other interventions: Solution-Focused Strategies, Mindfulness or Relaxation Training, Active listening / Reflection utilized , and Emotional Supportive Provided  . Discussed plans with patient for ongoing care management follow up and provided patient with direct contact information for care management team . Collaboration with PCP regarding development and update of comprehensive plan of care as evidenced by provider  attestation and co-signature . Inter-disciplinary care team collaboration (see longitudinal plan of care) Patient Goals/Self-Care Activities: Over the next 120 days . Attend all scheduled appointments with providers . Contact office with any questions or concerns . - learn and use visualization or guided imagery . - practice relaxation or meditation daily . - practice positive thinking and self-talk . I will coordinate with your doctor to assist with obtaining a new in-home aid        [name], MSW, [email or web address] Phone [phone] 9:30 AM

## 2020-12-28 NOTE — Telephone Encounter (Signed)
That would be fine 

## 2020-12-28 NOTE — Patient Instructions (Signed)
Visit Information  Goals Addressed              This Visit's Progress     Patient Stated   .  SW-"I need more help." (pt-stated)   On track     Patient Goals/Self-Care Activities: Over the next 120 days . Attend all scheduled appointments with providers . Contact office with any questions or concerns . - learn and use visualization or guided imagery . - practice relaxation or meditation daily . - practice positive thinking and self-talk . I will coordinate with your doctor to assist with obtaining a new in-home aid      Other   .  SW-Track and Manage My Symptoms-Depression   On track     Timeframe:  Long-Range Goal Priority:  High Start Date:   08/09/20                         Expected End Date: 04/06/21                    Follow Up Date- 30 days from 12/27/20  Patient Goals/Self-Care Activities: Over the next 120 days . Attend all scheduled appointments with providers . Contact office with any questions or concerns . - learn and use visualization or guided imagery . - practice relaxation or meditation daily . - practice positive thinking and self-talk . I will coordinate with your doctor to assist with obtaining a new in-home aid       Patient verbalizes understanding of instructions provided today and agrees to view in [place].   Telephone follow up appointment with care management team member scheduled for: within 92 days  [name], MSW, [email or web address] Phone [phone] 9:31 AM

## 2020-12-29 ENCOUNTER — Other Ambulatory Visit: Payer: Self-pay | Admitting: Nurse Practitioner

## 2020-12-29 MED ORDER — TRIAMCINOLONE ACETONIDE 0.1 % EX CREA: TOPICAL_CREAM | CUTANEOUS | 0 refills | Status: DC

## 2020-12-29 NOTE — Telephone Encounter (Signed)
I have sent refills on his triamcinolone cream.  As for colonoscopy results he will need to reach out to GI provider office for those.  Thanks.

## 2020-12-29 NOTE — Telephone Encounter (Signed)
Patient notified

## 2020-12-29 NOTE — Telephone Encounter (Signed)
Called pt to let him know that doing a virtual Friday would be ok no answer lvm

## 2020-12-31 ENCOUNTER — Ambulatory Visit: Payer: Medicare Other | Admitting: Nurse Practitioner

## 2021-01-04 ENCOUNTER — Ambulatory Visit: Payer: Self-pay | Admitting: General Practice

## 2021-01-04 ENCOUNTER — Telehealth: Payer: Self-pay | Admitting: General Practice

## 2021-01-04 DIAGNOSIS — I152 Hypertension secondary to endocrine disorders: Secondary | ICD-10-CM | POA: Diagnosis not present

## 2021-01-04 DIAGNOSIS — I25118 Atherosclerotic heart disease of native coronary artery with other forms of angina pectoris: Secondary | ICD-10-CM

## 2021-01-04 DIAGNOSIS — E1159 Type 2 diabetes mellitus with other circulatory complications: Secondary | ICD-10-CM

## 2021-01-04 DIAGNOSIS — F339 Major depressive disorder, recurrent, unspecified: Secondary | ICD-10-CM

## 2021-01-04 DIAGNOSIS — E1169 Type 2 diabetes mellitus with other specified complication: Secondary | ICD-10-CM

## 2021-01-04 DIAGNOSIS — E785 Hyperlipidemia, unspecified: Secondary | ICD-10-CM | POA: Diagnosis not present

## 2021-01-04 DIAGNOSIS — F419 Anxiety disorder, unspecified: Secondary | ICD-10-CM

## 2021-01-04 NOTE — Patient Instructions (Signed)
Visit Information  PATIENT GOALS: Goals Addressed            This Visit's Progress   . COMPLETED: RNCM: Enhance My Mental Skills       Timeframe:  Short-Term Goal Priority:  High Start Date:     10-15-2020                        Expected End Date:    01-04-2021                   Follow Up Date 01-04-2021   - do word search or crossword puzzles daily - learn a new hobby like knitting or woodworking - read 1 new book each month - stay in touch with my family and friends - take a walk daily and think about what I am seeing    Why is this important?    As we age, or sometimes because we have an illness, it feels like our memory and ability to figure things out is not very good.   There are things you can do to keep your memory and your thinking as strong as possible.    Notes: Encouraged the patient to write down appointments, the patient is leery about putting data on computers now.    Marland Kitchen RNCM: Make and Keep All Appointments       Timeframe:  Long-Range Goal Priority:  High Start Date:     10-15-2020                        Expected End Date:        01-04-2022               Follow Up Date 03-01-2021   - arrange a ride through an agency 1 week before appointment - ask family or friend for a ride - call to cancel if needed - keep a calendar with prescription refill dates - keep a calendar with appointment dates - learn the bus route - use public transportation    Why is this important?    Part of staying healthy is seeing the doctor for follow-up care.   If you forget your appointments, there are some things you can do to stay on track.    Notes: Has a colonoscopy for 10-22-2020 and podiatry appointment 10-26-2020. 01-04-2021: The patient reminded of his appointment with pcp on 01-07-2021 at 11 am.  The patient states he has transportation.       Patient Care Plan: General Social Work (Adult)    Problem Identified: Response to Treatment (Depression)     Goal: Response to  Treatment Maximized   Start Date: 12/27/2020  This Visit's Progress: On track  Priority: High  Note:   Current barriers:   . Acute Mental Health needs related to Anxiety . Housing barriers, Level of care concerns, and Mental Health Concerns   Needs Support, Education, and Care Coordination in order to meet unmet mental health needs. Clinical Goal(s): Over the next 120 days, patient will work with SW to reduce or manage symptoms of anxiety and increase knowledge and/or ability of: coping skills, healthy habits, self-management skills, and stress reduction.until connected for ongoing counseling.  Clinical Interventions:  . Assessed patient's previous treatment, needs, coping skills, current treatment, support system and barriers to care . Patient interviewed and appropriate assessments performed . Provided mental health counseling with regard to managing anxiety . Patient reports that he is  currently recovering from a stroke, which resulted in an increase in confusion and feelings of overwhelm . Patient is interested in obtaining a new caregiver. His previous aid was let go because of not showing up to work and talking to him inappropriately-CCM LCSW will contact new agency to initiate services . CCM LCSW provided validation and encouragement. During visit, pt became overwhelmed due to misplacement of credit cards and check book. CCM LCSW practiced deep breathing with patient, who was able to retrieve items . Patient enjoys watching movies and drawing to cope with stressors . Other interventions: Solution-Focused Strategies, Mindfulness or Relaxation Training, Active listening / Reflection utilized , and Emotional Supportive Provided  . Discussed plans with patient for ongoing care management follow up and provided patient with direct contact information for care management team . Collaboration with PCP regarding development and update of comprehensive plan of care as evidenced by provider  attestation and co-signature . Inter-disciplinary care team collaboration (see longitudinal plan of care) Patient Goals/Self-Care Activities: Over the next 120 days . Attend all scheduled appointments with providers . Contact office with any questions or concerns . - learn and use visualization or guided imagery . - practice relaxation or meditation daily . - practice positive thinking and self-talk . I will coordinate with your doctor to assist with obtaining a new in-home aid     Patient Care Plan: CCm Pharmacy Care Plan    Problem Identified: DM, HTN, AFIB, CAD, GERD , HLD, COPD   Priority: High    Long-Range Goal: Disease Management   This Visit's Progress: Not on track  Recent Progress: On track  Priority: High  Note:    Current Barriers:  . Unable to independently monitor therapeutic efficacy . Unable to achieve control of afib, HFpEF,  . Unable to maintain control of HTN, Afib . Unable to self administer medications as prescribed . Does not adhere to prescribed medication regimen . Does not contact provider office for questions/concerns .   Pharmacist Clinical Goal(s):  Marland Kitchen Patient will achieve adherence to monitoring guidelines and medication adherence to achieve therapeutic efficacy . achieve control of htn as evidenced by readings . maintain control of diabetes as evidenced by lab values  . adhere to plan to optimize therapeutic regimen for hyperlipidemia as evidenced by report of adherence to recommended medication management changes . achieve ability to self administer medications as prescribed through use of pill box or pill packaging as evidenced by patient report . adhere to prescribed medication regimen as evidenced by fill dates . contact provider office for questions/concerns as evidenced notation of same in electronic health record through collaboration with PharmD and provider.  .   Interventions: . 1:1 collaboration with [name], NP regarding  development and update of comprehensive plan of care as evidenced by provider attestation and co-signature . Inter-disciplinary care team collaboration (see longitudinal plan of care) . Comprehensive medication review performed; medication list updated in electronic medical record  BP Readings from Last 3 Encounters:  11/07/20 123/65  10/06/20 136/78  08/31/20 (!) 177/96    Hypertension (BP goal <130/80) -Controlled -Current treatment: . toprol xl  50 mg q24 . Furosemide 80 qam , 40 mg qpm -Medications previously tried: lisinopril on hold per d/c instructions -Current home readings: 142/89,  -Denies hypotensive/hypertensive symptoms -Educated on BP goals and benefits of medications for prevention of heart attack, stroke and kidney damage; Daily salt intake goal < 2300 mg; Importance of home blood pressure monitoring; -Counseled to monitor BP at  home daily, document, and provide log at future appointments -Counseled on diet and exercise extensively Recommended to continue current medication. Unable to determine if patient is taking lisinopril.  Patient states the pharmacy adjusted his pill packaging. Spoke with Leafy Ro at [facility] who states lisinopril was in his previous packaging filled 10/28/20 . This was already dispensed to patient and they did not adjust.  Hyperlipidemia: (LDL goal < 70) -Uncontrolled -Current treatment: . Lipitor 80 mg qd . Asa 81 mg qd . Zetia 10 mg qd -Medications previously tried: Crestor -Current dietary patterns: reports having a [place] sandwich for lunch today -Current exercise habits: is no longer walking but is doing a virtual exercise leg routine -Educated on Benefits of statin for ASCVD risk reduction; Importance of limiting foods high in cholesterol; Exercise goal of 150 minutes per week; -Counseled on diet and exercise extensively Recommended to continue current medication. Recommend PCSK-9 if LDL remains elevated. Patient compliance with statin  difficult to gauge. Counseled on not taking atorvastatin and crestor. Patient denies taking both. Per [facility] Crestor was not removed from packaging- new RX sent to patient for atorvastatin.  . Atrial Fibrillation (Goal: prevent stroke and major bleeding) -Not ideally controlled -CHADSVASC: 7 -Current treatment: . Rate control: toprol xl 50 mg qd . Anticoagulation: Xarelto 20 mg qd, asa 81 mg  -Medications previously tried: NA -Home BP and HR readings: 149/89  -Counseled on increased risk of stroke due to Afib and benefits of anticoagulation for stroke prevention; importance of adherence to anticoagulant exactly as prescribed; bleeding risk associated with Xarelto and importance of self-monitoring for signs/symptoms of bleeding; seeking medical attention after a head injury or if there is blood in the urine/stool; -Counseled on diet and exercise extensively Recommended to continue current medication  Lab Results  Component Value Date   HGBA1C 6.8 (H) 11/06/2020    Diabetes (A1c goal <7%) -Controlled -Current medications:  Jardiance 10 mg qd -Medications previously tried: NA -Current home glucose readings . fasting glucose: 120 yesterday  -Denies hypoglycemic/hyperglycemic symptoms -Current exercise: Doing virtual leg exercises -Educated on A1c and blood sugar goals; Complications of diabetes including kidney damage, retinal damage, and cardiovascular disease; Exercise goal of 150 minutes per week; Benefits of weight loss; Prevention and management of hypoglycemic episodes; Benefits of routine self-monitoring of blood sugar; -Counseled to check feet daily and get yearly eye exams -Recommended to continue current medication Educated on the importance of checking BG every day. Encouraged patient to use the palm of his hand as an alternate test site when his fingers are sore.   Patient Goals/Self-Care Activities . Patient will:  - take medications as prescribed focus  on medication adherence by utilizing pill packets check glucose daily, document, and provide at future appointments check blood pressure daily, document, and provide at future appointments target a minimum of 150 minutes of moderate intensity exercise weekly engage in dietary modifications by reducing sodium and fast food consumption  Follow Up Plan: Telephone follow up appointment with care management team member scheduled for:     Patient Care Plan: RNCM: Coronary Artery Disease (Adult) and HLD    Problem Identified: RNCM: Disease Progression (Coronary Artery Disease) and HLD   Priority: Medium    Long-Range Goal: RNCM: Disease Progression Prevented or Minimized   Priority: Medium  Note:   Current Barriers:  . Poorly controlled hyperlipidemia, complicated by anxiety, HF, HTN . Current antihyperlipidemic regimen: Crestor 40 mg QD, Zetia 10 mg QD . Most recent lipid panel:  Component Value Date/Time   CHOL 256 (H) 10/06/2020 1153   CHOL 194 12/30/2013 0413   TRIG 139 10/06/2020 1153   TRIG 158 12/30/2013 0413   HDL 57 10/06/2020 1153   HDL 42 12/30/2013 0413   CHOLHDL 4 09/12/2013 1035   VLDL 32 12/30/2013 0413   LDLCALC 174 (H) 10/06/2020 1153   LDLCALC 120 (H) 12/30/2013 0413   LDLDIRECT 178.5 07/26/2011 1234 .   Marland Kitchen ASCVD risk enhancing conditions: age >40, DM, HTN, CHF . Unable to independently manage HLD and CAD . Lacks social connections . Does not maintain contact with provider office . Does not contact provider office for questions/concerns RN Care Manager Clinical Goal(s):  . patient will work with Consulting civil engineer, providers, and care team towards execution of optimized self-health management plan . patient will verbalize understanding of plan for effective management of HLD and CAD . patient will work with [facility] and pcp  to address needs related to HLD and CAD . patient will attend all scheduled medical appointments: 01-07-2021 at 11 am, cardiologist in June  also Interventions: . Collaboration with [name], NP regarding development and update of comprehensive plan of care as evidenced by provider attestation and co-signature . Inter-disciplinary care team collaboration (see longitudinal plan of care) . Medication review performed; medication list updated in electronic medical record.  Bertram Savin care team collaboration (see longitudinal plan of care) . Referred to pharmacy team for assistance with HLD and CAD medication management . Evaluation of current treatment plan related to HLD and CAD and patient's adherence to plan as established by provider. 01-04-2021: The patient was in the ER on 12-08-2020 for dehydration. States he feels it is because of the colonoscopy prep he had to do and having to have it done 3 times before completion. The patient blames a lot of his issues on his former caregiver "Annette".  Had to redirect the patient several times during the call. Education on writing down questions to ask at the pcp and cardiology appointments.  . Advised patient to call the office for changes in conditions or questions  . Provided education to patient re: heart healthy diet and weight loss. 01-04-2021: The patients states weight is 224 today. States he is not having issues with edema or swelling. States he is staying hydrated and eating well. Does say he gets easily short of breath with activity and can not do things like he normally does. Education on pacing activity and doing things in short time blocks then resting before resuming activity.  . Reviewed medications with patient and discussed compliance. The patient has started taking Zetia 10 mg daily to his Crestor 40 mg.  The patient verbalized understanding. 01-04-2021: States compliance with medications . Reviewed scheduled/upcoming provider appointments including: 01-07-2021 at 11 am, has an appointment with cardiologist mid June . Discussed plans with patient for ongoing care  management follow up and provided patient with direct contact information for care management team Patient Goals/Self-Care Activities: - call for medicine refill 2 or 3 days before it runs out - call if I am sick and can't take my medicine - keep a list of all the medicines I take; vitamins and herbals too - learn to read medicine labels - use a pillbox to sort medicine - use an alarm clock or phone to remind me to take my medicine - change to whole grain breads, cereal, pasta - drink 6 to 8 glasses of water each day - eat 3 to 5 servings  of fruits and vegetables each day - eat 5 or 6 small meals each day - fill half the plate with nonstarchy vegetables - limit fast food meals to no more than 1 per week - manage portion size - prepare main meal at home 3 to 5 days each week - read food labels for fat, fiber, carbohydrates and portion size - set a realistic goal - be open to making changes - I can manage, know and watch for signs of a heart attack - if I have chest pain, call for help - learn about small changes that will make a big difference - learn my personal risk factors - barriers to treatment adherence reviewed and addressed - difficulty of making life-long changes acknowledged - functional limitation screening reviewed - healthy lifestyle promoted - medication-adherence assessment completed - medication side effects managed - rescue (action) plan developed - response to pharmacologic therapy monitored - self-awareness of signs/symptoms of worsening disease encouraged Follow Up Plan: Telephone follow up appointment with care management team member scheduled for: 03-01-2021 at 0945am     Task: RNCM: Alleviate Barriers to Coronary Artery Disease Therapy   Note:   Care Management Activities:    - barriers to treatment adherence reviewed and addressed - difficulty of making life-long changes acknowledged - functional limitation screening reviewed - healthy lifestyle  promoted - medication-adherence assessment completed - medication side effects managed - rescue (action) plan developed - response to pharmacologic therapy monitored - self-awareness of signs/symptoms of worsening disease encouraged       Patient Care Plan: RNCM: Hypertension (Adult)    Problem Identified: RNCM: Hypertension (Hypertension)   Priority: Medium    Long-Range Goal: RNCM: Hypertension Monitored   Priority: Medium  Note:   Objective:  . Last practice recorded BP readings:  . BP Readings from Last 3 Encounters: .  12/08/20 . 139/79 .  12/03/20 . 136/86 .  11/26/20 . (!) 116/97 .    Marland Kitchen Most recent eGFR/CrCl: No results found for: EGFR  No components found for: CRCL Current Barriers:  Marland Kitchen Knowledge Deficits related to basic understanding of hypertension pathophysiology and self care management . Knowledge Deficits related to understanding of medications prescribed for management of hypertension . Limited Social Support . Lacks social connections . Does not maintain contact with provider office . Does not contact provider office for questions/concerns Case Manager Clinical Goal(s):  Marland Kitchen Over the next 120 days, patient will verbalize understanding of plan for hypertension management . Over the next 120 days, patient will attend all scheduled medical appointments: 01-07-2021 at 11 am . Over the next 120 days, patient will demonstrate improved adherence to prescribed treatment plan for hypertension as evidenced by taking all medications as prescribed, monitoring and recording blood pressure as directed, adhering to low sodium/DASH diet . Over the next 120 days, patient will demonstrate improved health management independence as evidenced by checking blood pressure as directed and notifying PCP if SBP>160 or DBP > 90, taking all medications as prescribe, and adhering to a low sodium diet as discussed. . Over the next 120 days, patient will verbalize basic understanding of  hypertension disease process and self health management plan as evidenced by compliance with medications, compliance with diet, working with CCM team to manage health and well being.  Interventions:  . Collaboration with [name], NP regarding development and update of comprehensive plan of care as evidenced by provider attestation and co-signature . Inter-disciplinary care team collaboration (see longitudinal plan of care) . Evaluation of  current treatment plan related to hypertension self management and patient's adherence to plan as established by provider. 01-04-2021: The patient states he is feeling fine. He states he has the kit from [facility] but the pulse oximetry is missing from it. The patient states that his blood pressure is doing well. He states compliance with medications and dietary restrictions.  . Provided education to patient re: stroke prevention, s/s of heart attack and stroke, DASH diet, complications of uncontrolled blood pressure. 01-04-2021: Review of hight risk for strokes as the patient had a stroke earlier this year. Review of the effects of elevated blood pressures on body system.  . Reviewed medications with patient and discussed importance of compliance. 01-04-2021: States compliance with medications.  . Discussed plans with patient for ongoing care management follow up and provided patient with direct contact information for care management team . Advised patient, providing education and rationale, to monitor blood pressure daily and record, calling PCP for findings outside established parameters.  . Reviewed scheduled/upcoming provider appointments including: 01-07-2021  at 11 am Patient Goals: - blood pressure equipment and technique reviewed - blood pressure trends reviewed - depression screen reviewed - home or ambulatory blood pressure monitoring encouraged Self-Care Activities: - Self administers medications as prescribed Attends all scheduled provider  appointments Calls provider office for new concerns, questions, or BP outside discussed parameters Checks BP and records as discussed Follows a low sodium diet/DASH diet Follow Up Plan: Telephone follow up appointment with care management team member scheduled for: 03-01-2021 at 0945 am   Task: RNCM: Identify and Monitor Blood Pressure Elevation   Note:   Care Management Activities:    - blood pressure equipment and technique reviewed - blood pressure trends reviewed - depression screen reviewed - home or ambulatory blood pressure monitoring encouraged       Patient Care Plan: RNCM: Anxiety and depression    Problem Identified: RNCM: Anxiety and depression   Priority: Medium    Long-Range Goal: RNCM: Anxiety and depression   Priority: Medium  Note:   Current Barriers:  Marland Kitchen Knowledge Deficits related to resources for anxiety/depression and meeting the patients needs  . Chronic Disease Management support and education needs related to effective management of anxiety and depression . Lacks caregiver support. 01-04-2021: The patient is requesting a new caregiver. The patient states he is working with a Hotel manager" and talking to [employer]. Will collaborate with the LCSW concerning patient needs.  Leodis Liverpool social connections . Does not maintain contact with provider office . Does not contact provider office for questions/concerns . Frequent visits to the ER  Nurse Case Manager Clinical Goal(s):  . patient will verbalize understanding of plan for effective management of anxiety . patient will work with [facility] [place] team and pcp  to address needs related to effective management of anxiety  . patient will attend all scheduled medical appointments: 01-07-2021- reminded the patient of his appointment with pcp during outreach today.   Interventions:  . 1:1 collaboration with [name], NP regarding development and update of comprehensive plan of care as evidenced by provider attestation  and co-signature . Inter-disciplinary care team collaboration (see longitudinal plan of care) . Evaluation of current treatment plan related to anxiety  and patient's adherence to plan as established by provider. 01-04-2021: The patient is anxious about past caregiver and his living arrangements. He was at a friends house that lives near him. He states that he is waiting for Scottie to get him a new caregiver. Has talked  with Jasmine the LCSW and also working with her. Empathetic listening and support give. Encouraged the patient to discuss his feelings with his support system and new social worker that comes in home. Education on keeping appointments and following recommendations of the pcp and other providers.  . Advised patient to call office for changes in mood/anxiety/depression or questions  . Provided education to patient re: anxiety and divisional activities, working with the CCM team for resources to help with managing anxiety.  . Reviewed medications with patient and discussed compliance. 01-04-2021: The patient states that he is taking his medications as directed and is compliant.  . Reviewed scheduled/upcoming provider appointments including: 01-07-2021 at 11 am . Discussed plans with patient for ongoing care management follow up and provided patient with direct contact information for care management team  Patient Goals/Self-Care Activities Over the next 120 days, patient will:  - Patient will self administer medications as prescribed Patient will attend all scheduled provider appointments Patient will call pharmacy for medication refills Patient will attend church or other social activities Patient will continue to perform ADL's independently Patient will continue to perform IADL's independently Patient will call provider office for new concerns or questions Patient will work with BSW to address care coordination needs and will continue to work with the clinical team to address health  care and disease management related needs.   - calendar and clock use encouraged - cognitive-stimulating activities promoted - consistent daily routine encouraged - extra time for response allowed- 01-04-2021: Encouraged the patient to write down questions to ask at his next appointment. The patient easily gets off track with is thinking. Discussed ways to remember appointments  - medication list reviewed - memory aid use encouraged - regular activity or exercise promoted - repetition utilized - social relationships promoted - written communication utilized  Follow Up Plan: Telephone follow up appointment with care management team member scheduled for: 03-01-2021 at 0945am       Task: RNCM: Identify and Optimize Mental Processes   Note:   Care Management Activities:    - calendar and clock use encouraged - cognitive-stimulating activities promoted - consistent daily routine encouraged - extra time for response allowed - medication list reviewed - memory aid use encouraged - regular activity or exercise promoted - repetition utilized - social relationships promoted - written communication utilized        Patient verbalizes understanding of instructions provided today and agrees to view in [name].   Telephone follow up appointment with care management team member scheduled for: 03-01-2021 at [place] am  Noreene Larsson RN, MSN, [facility] Mobile: [phone]

## 2021-01-04 NOTE — Chronic Care Management (AMB) (Signed)
Chronic Care Management   CCM RN Visit Note  01/04/2021 Name: Evan [name] MRN: [id number] DOB: Jul 26, 1956  Subjective: Evan [name] is a 65 y.o. year old male who is a primary care patient of [name], Henrine Screws T, NP. The care management team was consulted for assistance with disease management and care coordination needs.    Engaged with patient by telephone for follow up visit in response to provider referral for case management and/or care coordination services.   Consent to Services:  The patient was given information about Chronic Care Management services, agreed to services, and gave verbal consent prior to initiation of services.  Please see initial visit note for detailed documentation.   Patient agreed to services and verbal consent obtained.   Assessment: Review of patient past medical history, allergies, medications, health status, including review of consultants reports, laboratory and other test data, was performed as part of comprehensive evaluation and provision of chronic care management services.   SDOH (Social Determinants of Health) assessments and interventions performed:    CCM Care Plan  Allergies  Allergen Reactions  . Penicillin G Hives  . Sulfa Antibiotics Hives  . Tiotropium   . Zoloft [Sertraline Hcl] Other (See Comments)    Outpatient Encounter Medications as of 01/04/2021  Medication Sig  . acetaminophen (TYLENOL) 500 MG tablet Take 500 mg by mouth every 6 (six) hours as needed.  Marland Kitchen aspirin 81 MG chewable tablet Chew 1 tablet (81 mg total) by mouth daily.  Marland Kitchen atorvastatin (LIPITOR) 80 MG tablet Take 1 tablet (80 mg total) by mouth daily.  . Blood Glucose Monitoring Suppl (ONETOUCH VERIO) w/Device KIT Use to check blood sugar 3 to 4 times a day and document.  Please bring to visits for review.  . budesonide-formoterol (SYMBICORT) 80-4.5 MCG/ACT inhaler Inhale 2 puffs into the lungs 2 (two) times daily.  . Cholecalciferol 125 MCG (5000 UT) TABS Take 1  tablet (5,000 Units total) by mouth daily.  Regino Schultze Bandages & Supports (MEDICAL COMPRESSION STOCKINGS) MISC 1 Package by Does not apply route daily.  . empagliflozin (JARDIANCE) 10 MG TABS tablet Take 1 tablet (10 mg total) by mouth daily.  Marland Kitchen ezetimibe (ZETIA) 10 MG tablet Take 1 tablet (10 mg total) by mouth daily.  . furosemide (LASIX) 40 MG tablet TAKE 2 TABLETS BY MOUTH ONCE EVERY MORNING AND 1 TABLET ONCE EVERY EVENING  . glucose blood (ONETOUCH VERIO) test strip Use to check blood sugar 3 to 4 times a day.  . metoprolol succinate (TOPROL XL) 50 MG 24 hr tablet Take 1 tablet (50 mg total) by mouth daily.  . nitroGLYCERIN (NITROSTAT) 0.4 MG SL tablet Place 1 tablet (0.4 mg total) under the tongue every 5 (five) minutes as needed for chest pain.  Marland Kitchen olmesartan (BENICAR) 5 MG tablet Take 2 tablets (10 mg total) by mouth daily.  . Omega-3 Fatty Acids (OMEGA 3 500 PO) Take 500 mg by mouth daily.  Glory Rosebush Delica Lancets 58I MISC Use to check blood sugar 2-3 times a day.  . pantoprazole (PROTONIX) 40 MG tablet Take 1 tablet (40 mg total) by mouth daily.  . risperiDONE (RISPERDAL) 0.5 MG tablet Take 1 tablet (0.5 mg total) by mouth 2 (two) times daily.  . rivaroxaban (XARELTO) 20 MG TABS tablet Take 1 tablet (20 mg total) by mouth at bedtime.  . sharps container 1 each by Does not apply route as needed.  . Skin Protectants, Misc. (EUCERIN) cream Apply topically as needed for dry skin.  Marland Kitchen  triamcinolone cream (KENALOG) 0.1 % APPLY TO AFFECTED AREA(s) TWICE DAILY  . vitamin B-12 (CYANOCOBALAMIN) 1000 MCG tablet Take 1 tablet (1,000 mcg total) by mouth daily.   No facility-administered encounter medications on file as of 01/04/2021.    Patient Active Problem List   Diagnosis Date Noted  . Other thrombophilia ([place]) 12/25/2020  . Vitamin D deficiency 12/04/2020  . Vitamin B12 deficiency 12/04/2020  . Stenosis of right carotid artery 12/03/2020  . History of CVA (cerebrovascular accident)  11/06/2020  . History of 2019 novel coronavirus disease (COVID-19) 09/27/2020  . Atherosclerosis of aorta ([place]) 12/04/2019  . Persistent atrial fibrillation ([place]) 09/12/2019  . CAD (coronary artery disease) 09/12/2019  . Chronic venous stasis 09/12/2019  . Hoarding disorder 09/12/2019  . Cervical spinal stenosis 09/12/2019  . Diabetic retinopathy ([place]) 09/12/2019  . COPD, mild ([place]) 09/12/2019  . Osteoporosis 09/09/2019  . History of prostate cancer 09/09/2019  . Anxiety 08/10/2019  . Obstructive sleep apnea on CPAP 08/10/2019  . Hyperlipidemia associated with type 2 diabetes mellitus ([place]) 08/10/2019  . Chronic diastolic CHF (congestive heart failure) ([place]) 01/09/2014  . Esophageal dysmotility 09/12/2013  . Type 2 diabetes mellitus with morbid obesity ([place]) 02/14/2012  . Obesity, morbid (more than 100 lbs over ideal weight or BMI > 40) ([place]) 07/26/2011  . OLD MYOCARDIAL INFARCTION 03/02/2010  . Hypertension associated with diabetes ([place]) 03/20/2009  . CORONARY ATHEROSCLEROSIS, ARTERY BYPASS GRAFT 03/20/2009    Conditions to be addressed/monitored:CAD, HTN, HLD, Anxiety and Depression  Care Plan : RNCM: Coronary Artery Disease (Adult) and HLD  Updates made by [name] since 01/04/2021 12:00 AM    Problem: RNCM: Disease Progression (Coronary Artery Disease) and HLD   Priority: Medium    Long-Range Goal: RNCM: Disease Progression Prevented or Minimized   Priority: Medium  Note:   Current Barriers:  . Poorly controlled hyperlipidemia, complicated by anxiety, HF, HTN . Current antihyperlipidemic regimen: Crestor 40 mg QD, Zetia 10 mg QD . Most recent lipid panel:     Component Value Date/Time   CHOL 256 (H) 10/06/2020 1153   CHOL 194 12/30/2013 0413   TRIG 139 10/06/2020 1153   TRIG 158 12/30/2013 0413   HDL 57 10/06/2020 1153   HDL 42 12/30/2013 0413   CHOLHDL 4 09/12/2013 1035   VLDL 32 12/30/2013 0413   LDLCALC 174 (H) 10/06/2020 1153   LDLCALC 120 (H) 12/30/2013  0413   LDLDIRECT 178.5 07/26/2011 1234 .   Marland Kitchen ASCVD risk enhancing conditions: age >25, DM, HTN, CHF . Unable to independently manage HLD and CAD . Lacks social connections . Does not maintain contact with provider office . Does not contact provider office for questions/concerns RN Care Manager Clinical Goal(s):  . patient will work with Consulting civil engineer, providers, and care team towards execution of optimized self-health management plan . patient will verbalize understanding of plan for effective management of HLD and CAD . patient will work with [facility] and pcp  to address needs related to HLD and CAD . patient will attend all scheduled medical appointments: 01-07-2021 at 11 am, cardiologist in June also Interventions: . Collaboration with [name], NP regarding development and update of comprehensive plan of care as evidenced by provider attestation and co-signature . Inter-disciplinary care team collaboration (see longitudinal plan of care) . Medication review performed; medication list updated in electronic medical record.  Bertram Savin care team collaboration (see longitudinal plan of care) . Referred to pharmacy team for assistance with HLD and CAD  medication management . Evaluation of current treatment plan related to HLD and CAD and patient's adherence to plan as established by provider. 01-04-2021: The patient was in the ER on 12-08-2020 for dehydration. States he feels it is because of the colonoscopy prep he had to do and having to have it done 3 times before completion. The patient blames a lot of his issues on his former caregiver "Annette".  Had to redirect the patient several times during the call. Education on writing down questions to ask at the pcp and cardiology appointments.  . Advised patient to call the office for changes in conditions or questions  . Provided education to patient re: heart healthy diet and weight loss. 01-04-2021: The patients states weight is 224  today. States he is not having issues with edema or swelling. States he is staying hydrated and eating well. Does say he gets easily short of breath with activity and can not do things like he normally does. Education on pacing activity and doing things in short time blocks then resting before resuming activity.  . Reviewed medications with patient and discussed compliance. The patient has started taking Zetia 10 mg daily to his Crestor 40 mg.  The patient verbalized understanding. 01-04-2021: States compliance with medications . Reviewed scheduled/upcoming provider appointments including: 01-07-2021 at 11 am, has an appointment with cardiologist mid June . Discussed plans with patient for ongoing care management follow up and provided patient with direct contact information for care management team Patient Goals/Self-Care Activities: - call for medicine refill 2 or 3 days before it runs out - call if I am sick and can't take my medicine - keep a list of all the medicines I take; vitamins and herbals too - learn to read medicine labels - use a pillbox to sort medicine - use an alarm clock or phone to remind me to take my medicine - change to whole grain breads, cereal, pasta - drink 6 to 8 glasses of water each day - eat 3 to 5 servings of fruits and vegetables each day - eat 5 or 6 small meals each day - fill half the plate with nonstarchy vegetables - limit fast food meals to no more than 1 per week - manage portion size - prepare main meal at home 3 to 5 days each week - read food labels for fat, fiber, carbohydrates and portion size - set a realistic goal - be open to making changes - I can manage, know and watch for signs of a heart attack - if I have chest pain, call for help - learn about small changes that will make a big difference - learn my personal risk factors - barriers to treatment adherence reviewed and addressed - difficulty of making life-long changes acknowledged -  functional limitation screening reviewed - healthy lifestyle promoted - medication-adherence assessment completed - medication side effects managed - rescue (action) plan developed - response to pharmacologic therapy monitored - self-awareness of signs/symptoms of worsening disease encouraged Follow Up Plan: Telephone follow up appointment with care management team member scheduled for: 03-01-2021 at 0945am     Care Plan : RNCM: Hypertension (Adult)  Updates made by [name] since 01/04/2021 12:00 AM    Problem: RNCM: Hypertension (Hypertension)   Priority: Medium    Long-Range Goal: RNCM: Hypertension Monitored   Priority: Medium  Note:   Objective:  . Last practice recorded BP readings:  . BP Readings from Last 3 Encounters: .  12/08/20 . 139/79 .  12/03/20 . 136/86 .  11/26/20 . (!) 116/97 .    Marland Kitchen Most recent eGFR/CrCl: No results found for: EGFR  No components found for: CRCL Current Barriers:  Marland Kitchen Knowledge Deficits related to basic understanding of hypertension pathophysiology and self care management . Knowledge Deficits related to understanding of medications prescribed for management of hypertension . Limited Social Support . Lacks social connections . Does not maintain contact with provider office . Does not contact provider office for questions/concerns Case Manager Clinical Goal(s):  Marland Kitchen Over the next 120 days, patient will verbalize understanding of plan for hypertension management . Over the next 120 days, patient will attend all scheduled medical appointments: 01-07-2021 at 11 am . Over the next 120 days, patient will demonstrate improved adherence to prescribed treatment plan for hypertension as evidenced by taking all medications as prescribed, monitoring and recording blood pressure as directed, adhering to low sodium/DASH diet . Over the next 120 days, patient will demonstrate improved health management independence as evidenced by checking blood pressure as  directed and notifying PCP if SBP>160 or DBP > 90, taking all medications as prescribe, and adhering to a low sodium diet as discussed. . Over the next 120 days, patient will verbalize basic understanding of hypertension disease process and self health management plan as evidenced by compliance with medications, compliance with diet, working with CCM team to manage health and well being.  Interventions:  . Collaboration with [name], NP regarding development and update of comprehensive plan of care as evidenced by provider attestation and co-signature . Inter-disciplinary care team collaboration (see longitudinal plan of care) . Evaluation of current treatment plan related to hypertension self management and patient's adherence to plan as established by provider. 01-04-2021: The patient states he is feeling fine. He states he has the kit from [facility] but the pulse oximetry is missing from it. The patient states that his blood pressure is doing well. He states compliance with medications and dietary restrictions.  . Provided education to patient re: stroke prevention, s/s of heart attack and stroke, DASH diet, complications of uncontrolled blood pressure. 01-04-2021: Review of hight risk for strokes as the patient had a stroke earlier this year. Review of the effects of elevated blood pressures on body system.  . Reviewed medications with patient and discussed importance of compliance. 01-04-2021: States compliance with medications.  . Discussed plans with patient for ongoing care management follow up and provided patient with direct contact information for care management team . Advised patient, providing education and rationale, to monitor blood pressure daily and record, calling PCP for findings outside established parameters.  . Reviewed scheduled/upcoming provider appointments including: 01-07-2021  at 11 am Patient Goals: - blood pressure equipment and technique reviewed - blood pressure trends  reviewed - depression screen reviewed - home or ambulatory blood pressure monitoring encouraged Self-Care Activities: - Self administers medications as prescribed Attends all scheduled provider appointments Calls provider office for new concerns, questions, or BP outside discussed parameters Checks BP and records as discussed Follows a low sodium diet/DASH diet Follow Up Plan: Telephone follow up appointment with care management team member scheduled for: 03-01-2021 at 0945 am   Care Plan : RNCM: Anxiety and depression  Updates made by [name] since 01/04/2021 12:00 AM    Problem: RNCM: Anxiety and depression   Priority: Medium    Long-Range Goal: RNCM: Anxiety and depression   Priority: Medium  Note:   Current Barriers:  Marland Kitchen Knowledge Deficits related to resources for  anxiety/depression and meeting the patients needs  . Chronic Disease Management support and education needs related to effective management of anxiety and depression . Lacks caregiver support. 01-04-2021: The patient is requesting a new caregiver. The patient states he is working with a Hotel manager" and talking to [employer]. Will collaborate with the LCSW concerning patient needs.  Leodis Liverpool social connections . Does not maintain contact with provider office . Does not contact provider office for questions/concerns . Frequent visits to the ER  Nurse Case Manager Clinical Goal(s):  . patient will verbalize understanding of plan for effective management of anxiety . patient will work with [facility] team and pcp  to address needs related to effective management of anxiety  . patient will attend all scheduled medical appointments: 01-07-2021- reminded the patient of his appointment with pcp during outreach today.   Interventions:  . 1:1 collaboration with [name], NP regarding development and update of comprehensive plan of care as evidenced by provider attestation and co-signature . Inter-disciplinary care  team collaboration (see longitudinal plan of care) . Evaluation of current treatment plan related to anxiety  and patient's adherence to plan as established by provider. 01-04-2021: The patient is anxious about past caregiver and his living arrangements. He was at a friends house that lives near him. He states that he is waiting for Scottie to get him a new caregiver. Has talked with Jasmine the LCSW and also working with her. Empathetic listening and support give. Encouraged the patient to discuss his feelings with his support system and new social worker that comes in home. Education on keeping appointments and following recommendations of the pcp and other providers.  . Advised patient to call office for changes in mood/anxiety/depression or questions  . Provided education to patient re: anxiety and divisional activities, working with the CCM team for resources to help with managing anxiety.  . Reviewed medications with patient and discussed compliance. 01-04-2021: The patient states that he is taking his medications as directed and is compliant.  . Reviewed scheduled/upcoming provider appointments including: 01-07-2021 at 11 am . Discussed plans with patient for ongoing care management follow up and provided patient with direct contact information for care management team  Patient Goals/Self-Care Activities Over the next 120 days, patient will:  - Patient will self administer medications as prescribed Patient will attend all scheduled provider appointments Patient will call pharmacy for medication refills Patient will attend church or other social activities Patient will continue to perform ADL's independently Patient will continue to perform IADL's independently Patient will call provider office for new concerns or questions Patient will work with BSW to address care coordination needs and will continue to work with the clinical team to address health care and disease management related needs.   -  calendar and clock use encouraged - cognitive-stimulating activities promoted - consistent daily routine encouraged - extra time for response allowed- 01-04-2021: Encouraged the patient to write down questions to ask at his next appointment. The patient easily gets off track with is thinking. Discussed ways to remember appointments  - medication list reviewed - memory aid use encouraged - regular activity or exercise promoted - repetition utilized - social relationships promoted - written communication utilized  Follow Up Plan: Telephone follow up appointment with care management team member scheduled for: 03-01-2021 at 0945am         Plan:Telephone follow up appointment with care management team member scheduled for:  03-01-2021 at [place] am  Noreene Larsson RN, MSN, CCM  [facility] Mobile: [phone]

## 2021-01-07 ENCOUNTER — Other Ambulatory Visit: Payer: Self-pay

## 2021-01-07 ENCOUNTER — Encounter: Payer: Self-pay | Admitting: Nurse Practitioner

## 2021-01-07 ENCOUNTER — Ambulatory Visit (INDEPENDENT_AMBULATORY_CARE_PROVIDER_SITE_OTHER): Payer: Medicare Other | Admitting: Nurse Practitioner

## 2021-01-07 DIAGNOSIS — E1169 Type 2 diabetes mellitus with other specified complication: Secondary | ICD-10-CM

## 2021-01-07 DIAGNOSIS — G4733 Obstructive sleep apnea (adult) (pediatric): Secondary | ICD-10-CM

## 2021-01-07 DIAGNOSIS — D6869 Other thrombophilia: Secondary | ICD-10-CM

## 2021-01-07 DIAGNOSIS — E785 Hyperlipidemia, unspecified: Secondary | ICD-10-CM

## 2021-01-07 DIAGNOSIS — Z8546 Personal history of malignant neoplasm of prostate: Secondary | ICD-10-CM

## 2021-01-07 DIAGNOSIS — Z9989 Dependence on other enabling machines and devices: Secondary | ICD-10-CM

## 2021-01-07 DIAGNOSIS — E1159 Type 2 diabetes mellitus with other circulatory complications: Secondary | ICD-10-CM | POA: Diagnosis not present

## 2021-01-07 DIAGNOSIS — I5032 Chronic diastolic (congestive) heart failure: Secondary | ICD-10-CM | POA: Diagnosis not present

## 2021-01-07 DIAGNOSIS — I152 Hypertension secondary to endocrine disorders: Secondary | ICD-10-CM

## 2021-01-07 DIAGNOSIS — F419 Anxiety disorder, unspecified: Secondary | ICD-10-CM

## 2021-01-07 DIAGNOSIS — J449 Chronic obstructive pulmonary disease, unspecified: Secondary | ICD-10-CM | POA: Diagnosis not present

## 2021-01-07 DIAGNOSIS — I4819 Other persistent atrial fibrillation: Secondary | ICD-10-CM | POA: Diagnosis not present

## 2021-01-07 NOTE — Assessment & Plan Note (Signed)
Continue collaboration with oncology and urology.  PSA annually.

## 2021-01-07 NOTE — Assessment & Plan Note (Signed)
Chronic, ongoing with some grandiose thought processes present, ?more schizophrenia on presentation.  Continue current medication regimen and adjust as needed.  Has history on review old records of missing doses Risperdal, will continue current medication at this time and adjust as needed -- pill packs have been beneficial for patient.  Continue collaboration with CCM SW.  Would benefit from psychiatry and therapy, but he refuses these.  Will continue to encourage this and discuss with him.

## 2021-01-07 NOTE — Patient Instructions (Signed)
Diabetes Mellitus and Nutrition, Adult When you have diabetes, or diabetes mellitus, it is very important to have healthy eating habits because your blood sugar (glucose) levels are greatly affected by what you eat and drink. Eating healthy foods in the right amounts, at about the same times every day, can help you:  Control your blood glucose.  Lower your risk of heart disease.  Improve your blood pressure.  Reach or maintain a healthy weight. What can affect my meal plan? Every person with diabetes is different, and each person has different needs for a meal plan. Your health care provider may recommend that you work with a dietitian to make a meal plan that is best for you. Your meal plan may vary depending on factors such as:  The calories you need.  The medicines you take.  Your weight.  Your blood glucose, blood pressure, and cholesterol levels.  Your activity level.  Other health conditions you have, such as heart or kidney disease. How do carbohydrates affect me? Carbohydrates, also called carbs, affect your blood glucose level more than any other type of food. Eating carbs naturally raises the amount of glucose in your blood. Carb counting is a method for keeping track of how many carbs you eat. Counting carbs is important to keep your blood glucose at a healthy level, especially if you use insulin or take certain oral diabetes medicines. It is important to know how many carbs you can safely have in each meal. This is different for every person. Your dietitian can help you calculate how many carbs you should have at each meal and for each snack. How does alcohol affect me? Alcohol can cause a sudden decrease in blood glucose (hypoglycemia), especially if you use insulin or take certain oral diabetes medicines. Hypoglycemia can be a life-threatening condition. Symptoms of hypoglycemia, such as sleepiness, dizziness, and confusion, are similar to symptoms of having too much  alcohol.  Do not drink alcohol if: ? Your health care provider tells you not to drink. ? You are pregnant, may be pregnant, or are planning to become pregnant.  If you drink alcohol: ? Do not drink on an empty stomach. ? Limit how much you use to:  0-1 drink a day for women.  0-2 drinks a day for men. ? Be aware of how much alcohol is in your drink. In the [place] one drink equals one 12 oz bottle of beer (355 mL), one 5 oz glass of wine (148 mL), or one 1 oz glass of hard liquor (44 mL). ? Keep yourself hydrated with water, diet soda, or unsweetened iced tea.  Keep in mind that regular soda, juice, and other mixers may contain a lot of sugar and must be counted as carbs. What are tips for following this plan? Reading food labels  Start by checking the serving size on the "Nutrition Facts" label of packaged foods and drinks. The amount of calories, carbs, fats, and other nutrients listed on the label is based on one serving of the item. Many items contain more than one serving per package.  Check the total grams (g) of carbs in one serving. You can calculate the number of servings of carbs in one serving by dividing the total carbs by 15. For example, if a food has 30 g of total carbs per serving, it would be equal to 2 servings of carbs.  Check the number of grams (g) of saturated fats and trans fats in one serving. Choose foods that have   a low amount or none of these fats.  Check the number of milligrams (mg) of salt (sodium) in one serving. Most people should limit total sodium intake to less than 2,300 mg per day.  Always check the nutrition information of foods labeled as "low-fat" or "nonfat." These foods may be higher in added sugar or refined carbs and should be avoided.  Talk to your dietitian to identify your daily goals for nutrients listed on the label. Shopping  Avoid buying canned, pre-made, or processed foods. These foods tend to be high in fat, sodium, and added  sugar.  Shop around the outside edge of the grocery store. This is where you will most often find fresh fruits and vegetables, bulk grains, fresh meats, and fresh dairy. Cooking  Use low-heat cooking methods, such as baking, instead of high-heat cooking methods like deep frying.  Cook using healthy oils, such as olive, canola, or sunflower oil.  Avoid cooking with butter, cream, or high-fat meats. Meal planning  Eat meals and snacks regularly, preferably at the same times every day. Avoid going long periods of time without eating.  Eat foods that are high in fiber, such as fresh fruits, vegetables, beans, and whole grains. Talk with your dietitian about how many servings of carbs you can eat at each meal.  Eat 4-6 oz (112-168 g) of lean protein each day, such as lean meat, chicken, fish, eggs, or tofu. One ounce (oz) of lean protein is equal to: ? 1 oz (28 g) of meat, chicken, or fish. ? 1 egg. ?  cup (62 g) of tofu.  Eat some foods each day that contain healthy fats, such as avocado, nuts, seeds, and fish.   What foods should I eat? Fruits Berries. Apples. Oranges. Peaches. Apricots. Plums. Grapes. Mango. Papaya. Pomegranate. Kiwi. Cherries. Vegetables Lettuce. Spinach. Leafy greens, including kale, chard, collard greens, and mustard greens. Beets. Cauliflower. Cabbage. Broccoli. Carrots. Green beans. Tomatoes. Peppers. Onions. Cucumbers. Brussels sprouts. Grains Whole grains, such as whole-wheat or whole-grain bread, crackers, tortillas, cereal, and pasta. Unsweetened oatmeal. Quinoa. Brown or wild rice. Meats and other proteins Seafood. Poultry without skin. Lean cuts of poultry and beef. Tofu. Nuts. Seeds. Dairy Low-fat or fat-free dairy products such as milk, yogurt, and cheese. The items listed above may not be a complete list of foods and beverages you can eat. Contact a dietitian for more information. What foods should I avoid? Fruits Fruits canned with  syrup. Vegetables Canned vegetables. Frozen vegetables with butter or cream sauce. Grains Refined white flour and flour products such as bread, pasta, snack foods, and cereals. Avoid all processed foods. Meats and other proteins Fatty cuts of meat. Poultry with skin. Breaded or fried meats. Processed meat. Avoid saturated fats. Dairy Full-fat yogurt, cheese, or milk. Beverages Sweetened drinks, such as soda or iced tea. The items listed above may not be a complete list of foods and beverages you should avoid. Contact a dietitian for more information. Questions to ask a health care provider  Do I need to meet with a diabetes educator?  Do I need to meet with a dietitian?  What number can I call if I have questions?  When are the best times to check my blood glucose? Where to find more information:  American Diabetes Association: diabetes.org  Academy of Nutrition and Dietetics: www.eatright.org  National Institute of Diabetes and Digestive and Kidney Diseases: www.niddk.nih.gov  Association of Diabetes Care and Education Specialists: www.diabeteseducator.org Summary  It is important to have healthy eating   habits because your blood sugar (glucose) levels are greatly affected by what you eat and drink.  A healthy meal plan will help you control your blood glucose and maintain a healthy lifestyle.  Your health care provider may recommend that you work with a dietitian to make a meal plan that is best for you.  Keep in mind that carbohydrates (carbs) and alcohol have immediate effects on your blood glucose levels. It is important to count carbs and to use alcohol carefully. This information is not intended to replace advice given to you by your health care provider. Make sure you discuss any questions you have with your health care provider. Document Revised: 07/01/2019 Document Reviewed: 07/01/2019 Elsevier Patient Education  2021 [employer]  

## 2021-01-07 NOTE — Progress Notes (Signed)
BP 134/78 (BP Location: Left Arm)   Pulse 94   Temp 98.8 F (37.1 C) (Oral)   Wt 227 lb (103 kg)   SpO2 98%   BMI 37.77 kg/m    Subjective:    Patient ID: Evan [name], male    DOB: October 24, 1955, 64 y.o.   MRN: [id number]  HPI: Evan [name] is a 65 y.o. male  Chief Complaint  Patient presents with  . Diabetes  . Hyperlipidemia  . Hypertension  . COPD  . Atrial Fibrillation  . Mood   DIABETES Continues on Jardiance 10 MG daily. A1C in April 6.8%. B12 on 12/03/20 = 370 and Vitamin D 18.5 -- supplements sent to placed in pill pack. Hypoglycemic episodes:no Polydipsia/polyuria:no Visual disturbance:no Chest pain:no Paresthesias:no Glucose Monitoring:no Accucheck frequency: Not Checking Fasting glucose: Post prandial: Evening: Before meals: Taking Insulin?:no Long acting insulin: Short acting insulin: Blood Pressure Monitoring:not checking Retinal Examination:Up to Date Foot Exam:Up to Date Pneumovax:unknown Influenza:unknown Aspirin:yes  HYPERTENSION / HYPERLIPIDEMIA Saw [facility] on 03/31/2020, no changes made.  Last saw Dr. [name] on 03/15/20.  Recent EF 11/06/20 was 50 to 55%.  Uses CPAP at home about 100% of the time.  No recent NTG use.  PCP added Olmesartan last visit in office in April.  Has history of prostate cancer and did attend follow-up with Dr. Baruch Gouty on 02/12/20.  PSA at visit 0.05. Satisfied with current treatment?yes Duration of hypertension:chronic BP monitoring frequency:not checking BP range:  BP medication side effects:no Duration of hyperlipidemia:chronic Cholesterol medication side effects:no Cholesterol supplements: none Medication compliance:good compliance Aspirin:yes Recent stressors:no Recurrent headaches:no Visual changes:no Palpitations:no Dyspnea:no Chest pain:no Lower extremity  edema:no Dizzy/lightheaded:no  COPD Has Symbicort -- does not use this all the time, only as needed he reports.  Never a smoker. COPD status: stable Satisfied with current treatment?: yes Oxygen use: no Dyspnea frequency: occasional at baseline Cough frequency: occasional at baseline Rescue inhaler frequency:  rarely Limitation of activity: no Productive cough: none Last Spirometry: unknown Pneumovax: given today Influenza: Up to Date  ANXIETY/STRESS Continues on Seroquel for mood, which he reports is helpful.  When [facility] there was concern he did not take medication consistently, noted during nursing visits on review of notes, there continues to be concern about this, although has pill packs now -- continues to report taking and forgets to bring pill packs to visit for assessment at times.    He continues to refuse referral to psychiatry and continues to discuss his friendships with multiple different famous people -- Frankie Scipio, [address], [place].  Duration:stable Anxious mood:no Excessive worrying:no Irritability:no Sweating:no Nausea:no Palpitations:no Hyperventilation:no Panic attacks:no Agoraphobia:no Obscessions/compulsions:yes Depressed mood:no Depression screen [facility] 2/9 12/03/2020 10/06/2020 07/08/2020 04/08/2020 02/16/2020  Decreased Interest 0 0 0 0 0  Down, Depressed, Hopeless 0 0 0 0 0  PHQ - 2 Score 0 0 0 0 0  Altered sleeping 0 2 0 0 -  Tired, decreased energy 0 0 0 0 -  Change in appetite 0 0 0 0 -  Feeling bad or failure about yourself  0 0 0 0 -  Trouble concentrating 0 0 0 0 -  Moving slowly or fidgety/restless 0 0 0 0 -  Suicidal thoughts 0 0 0 0 -  PHQ-9 Score 0 2 0 0 -  Difficult doing work/chores Not difficult at all - Not difficult at all Not difficult at all -  Some recent data might be hidden   GAD  7 : Generalized Anxiety Score 04/08/2020 03/31/2020 12/10/2019 11/05/2019  Nervous, Anxious, on  Edge 0 0 1 1  Control/stop worrying 1 0 1 0  Worry too much - different things 1 0 1 1  Trouble relaxing 0 0 0 1  Restless 0 0 0 0  Easily annoyed or irritable 0 0 1 0  Afraid - awful might happen 0 0 0 0  Total GAD 7 Score 2 0 4 3  Anxiety Difficulty Not difficult at all Not difficult at all Somewhat difficult Not difficult at all     Relevant past medical, surgical, family and social history reviewed and updated as indicated. Interim medical history since our last visit reviewed. Allergies and medications reviewed and updated.  Review of Systems  Constitutional: Negative for activity change, diaphoresis, fatigue and fever.  Respiratory: Negative for cough, chest tightness, shortness of breath and wheezing.   Cardiovascular: Positive for leg swelling (baseline per his report). Negative for chest pain and palpitations.  Gastrointestinal: Negative.   Endocrine: Negative for cold intolerance, heat intolerance, polydipsia, polyphagia and polyuria.  Neurological: Negative.   Psychiatric/Behavioral: Negative for decreased concentration, self-injury, sleep disturbance and suicidal ideas. The patient is not nervous/anxious.     Per HPI unless specifically indicated above     Objective:    BP 134/78 (BP Location: Left Arm)   Pulse 94   Temp 98.8 F (37.1 C) (Oral)   Wt 227 lb (103 kg)   SpO2 98%   BMI 37.77 kg/m   Wt Readings from Last 3 Encounters:  01/07/21 227 lb (103 kg)  12/08/20 239 lb (108.4 kg)  12/03/20 226 lb (102.5 kg)    Physical Exam Vitals and nursing note reviewed.  Constitutional:      General: He is awake. He is not in acute distress.    Appearance: He is well-developed. He is morbidly obese. He is not ill-appearing.  HENT:     Head: Normocephalic and atraumatic.     Right Ear: Hearing normal. No drainage.     Left Ear: Hearing normal. No drainage.  Eyes:     General: Lids are normal.        Right eye: No discharge.        Left eye: No discharge.      Pupils: Pupils are equal, round, and reactive to light.  Neck:     Thyroid: No thyromegaly.     Vascular: No carotid bruit or JVD.     Trachea: Trachea normal.  Cardiovascular:     Rate and Rhythm: Normal rate and regular rhythm.     Heart sounds: Normal heart sounds, S1 normal and S2 normal. No murmur heard. No gallop.   Pulmonary:     Effort: Pulmonary effort is normal. No accessory muscle usage or respiratory distress.     Breath sounds: Normal breath sounds.  Abdominal:     General: Bowel sounds are normal.     Palpations: Abdomen is soft.     Tenderness: There is no abdominal tenderness.  Musculoskeletal:        General: Normal range of motion.     Cervical back: Normal range of motion and neck supple.     Right lower leg: Edema (trace) present.     Left lower leg: Edema (trace) present.  Lymphadenopathy:     Cervical: No cervical adenopathy.  Skin:    General: Skin is warm and dry.     Capillary Refill: Capillary refill takes less than 2 seconds.  Neurological:     Mental Status: He is alert and oriented to person, place, and time.     Gait: Gait is intact.  Psychiatric:        Attention and Perception: Attention normal.        Mood and Affect: Mood normal.        Speech: Speech normal.        Behavior: Behavior normal. Behavior is cooperative.     Comments: Tangential thought processes per baseline.  Talked about his friendship with [name] and showed PCP pictures of him on Instagram.  This is baseline for him.    Results for orders placed or performed during the hospital encounter of 58/52/77  Basic metabolic panel  Result Value Ref Range   Sodium 136 135 - 145 mmol/L   Potassium 3.9 3.5 - 5.1 mmol/L   Chloride 104 98 - 111 mmol/L   CO2 23 22 - 32 mmol/L   Glucose, Bld 119 (H) 70 - 99 mg/dL   BUN 19 8 - 23 mg/dL   Creatinine, Ser 0.82 0.61 - 1.24 mg/dL   Calcium 9.2 8.9 - 10.3 mg/dL   GFR, Estimated >60 >60 mL/min   Anion gap 9 5 - 15  CBC  Result  Value Ref Range   WBC 8.7 4.0 - 10.5 K/uL   RBC 4.99 4.22 - 5.81 MIL/uL   Hemoglobin 13.8 13.0 - 17.0 g/dL   HCT 43.5 39.0 - 52.0 %   MCV 87.2 80.0 - 100.0 fL   MCH 27.7 26.0 - 34.0 pg   MCHC 31.7 30.0 - 36.0 g/dL   RDW 15.9 (H) 11.5 - 15.5 %   Platelets 244 150 - 400 K/uL   nRBC 0.0 0.0 - 0.2 %  Urinalysis, Complete w Microscopic  Result Value Ref Range   Color, Urine YELLOW (A) YELLOW   APPearance CLEAR (A) CLEAR   Specific Gravity, Urine 1.019 1.005 - 1.030   pH 6.0 5.0 - 8.0   Glucose, UA NEGATIVE NEGATIVE mg/dL   Hgb urine dipstick NEGATIVE NEGATIVE   Bilirubin Urine NEGATIVE NEGATIVE   Ketones, ur NEGATIVE NEGATIVE mg/dL   Protein, ur NEGATIVE NEGATIVE mg/dL   Nitrite NEGATIVE NEGATIVE   Leukocytes,Ua NEGATIVE NEGATIVE   RBC / HPF 0-5 0 - 5 RBC/hpf   WBC, UA 0-5 0 - 5 WBC/hpf   Bacteria, UA NONE SEEN NONE SEEN   Squamous Epithelial / LPF NONE SEEN 0 - 5  Troponin I (High Sensitivity)  Result Value Ref Range   Troponin I (High Sensitivity) 18 (H) <18 ng/L  Troponin I (High Sensitivity)  Result Value Ref Range   Troponin I (High Sensitivity) 21 (H) <18 ng/L      Assessment & Plan:   Problem List Items Addressed This Visit      Cardiovascular and Mediastinum   Hypertension associated with diabetes (HCC)    Chronic, ongoing.  BP initially above goal and recheck at goal -- he was anxious and irritated to start.  Continue Olmesartan 10 MG daily, is tolerating, and continue Metoprolol + Lasix.  Recommend checking BP at home a few mornings a week at home and documenting + focus on DASH diet.  Continue collaboration with cardiology.  Return in July 2022 for visit and labs.      Chronic diastolic CHF (congestive heart failure) (HCC)    Chronic, ongoing. Euvolemic today.  Continue collaboration with cardiology and HF clinic. Recommend he take medication consistently. Recommend: - Reminded to call for  an overnight weight gain of >2 pounds or a weekly weight weight of >5  pounds - not adding salt to his food and has been reading food labels. Reviewed the importance of keeping daily sodium intake to 2000mg  daily  - Return in 2 months for visit and labs.      Persistent atrial fibrillation (HCC)    Chronic, ongoing.  Continue collaboration with cardiology and current medication regimen, including Metoprolol and Xarelto.  Labs in July due.        Respiratory   Obstructive sleep apnea on CPAP    Chronic, ongoing.  Recommend continue 100% use of CPAP.      COPD, mild (HCC)    Chronic, stable.  Continue current inhaler regimen and adjust as needed, recommend he use inhaler daily vs as needed.  Unable to attain spirometry, he is unable to follow instructions for use.        Endocrine   Type 2 diabetes mellitus with morbid obesity ([place]) - Primary    Chronic, ongoing with A1C recently 6.8% and urine ALB 30 and A:C 30-300 February 2022.  Continue ARB for kidney protection.  Avoid ACE due to COPD.  Continue current Jardiance, benefit with his HF.  Check labs in July 2022.  Recommend he check BS at least 3 mornings a week at home.  Return to office in July 2022.  Continue pill packs for consistent medication adherence.      Hyperlipidemia associated with type 2 diabetes mellitus (HCC)    Chronic, ongoing.  Continue current medication regimen and adjust as needed.  Return in 3 months for visit.  Lipid panel next visit, statin increased recently and LDL last visit in 120 range.        Hematopoietic and Hemostatic   Other thrombophilia ([place])    On Xarelto for A-Fib, continue this regimen and check CBC every 6 months.        Other   Obesity, morbid (more than 100 lbs over ideal weight or BMI > 40) (HCC)    BMI 37.77 with T2DM, HTN.  Recommended eating smaller high protein, low fat meals more frequently and exercising 30 mins a day 5 times a week with a goal of 10-15lb weight loss in the next 3 months. Patient voiced their understanding and motivation to adhere  to these recommendations.       Anxiety    Chronic, ongoing with some grandiose thought processes present, ?more schizophrenia on presentation.  Continue current medication regimen and adjust as needed.  Has history on review old records of missing doses Risperdal, will continue current medication at this time and adjust as needed -- pill packs have been beneficial for patient.  Continue collaboration with CCM SW.  Would benefit from psychiatry and therapy, but he refuses these.  Will continue to encourage this and discuss with him.      History of prostate cancer    Continue collaboration with oncology and urology.  PSA annually.         Time: 30 minutes, >50% spent counseling/or care coordination   Follow up plan: Return in about 1 month (around 02/08/2021) for T2DM, HTN/HLD, A-FIB, HF, MOOD, B12, Vit D with labs.

## 2021-01-07 NOTE — Assessment & Plan Note (Signed)
On Xarelto for A-Fib, continue this regimen and check CBC every 6 months.

## 2021-01-07 NOTE — Assessment & Plan Note (Signed)
Chronic, ongoing.  Continue collaboration with cardiology and current medication regimen, including Metoprolol and Xarelto.  Labs in July due.

## 2021-01-07 NOTE — Assessment & Plan Note (Signed)
Chronic, ongoing.  Recommend continue 100% use of CPAP.

## 2021-01-07 NOTE — Assessment & Plan Note (Signed)
Chronic, ongoing. Euvolemic today.  Continue collaboration with cardiology and HF clinic. Recommend he take medication consistently. Recommend: - Reminded to call for an overnight weight gain of >2 pounds or a weekly weight weight of >5 pounds - not adding salt to his food and has been reading food labels. Reviewed the importance of keeping daily sodium intake to 2000mg  daily  - Return in 2 months for visit and labs.

## 2021-01-07 NOTE — Assessment & Plan Note (Signed)
Chronic, ongoing.  BP initially above goal and recheck at goal -- he was anxious and irritated to start.  Continue Olmesartan 10 MG daily, is tolerating, and continue Metoprolol + Lasix.  Recommend checking BP at home a few mornings a week at home and documenting + focus on DASH diet.  Continue collaboration with cardiology.  Return in July 2022 for visit and labs.

## 2021-01-07 NOTE — Assessment & Plan Note (Signed)
Chronic, ongoing with A1C recently 6.8% and urine ALB 30 and A:C 30-300 February 2022.  Continue ARB for kidney protection.  Avoid ACE due to COPD.  Continue current Jardiance, benefit with his HF.  Check labs in July 2022.  Recommend he check BS at least 3 mornings a week at home.  Return to office in July 2022.  Continue pill packs for consistent medication adherence.

## 2021-01-07 NOTE — Assessment & Plan Note (Signed)
BMI 37.77 with T2DM, HTN.  Recommended eating smaller high protein, low fat meals more frequently and exercising 30 mins a day 5 times a week with a goal of 10-15lb weight loss in the next 3 months. Patient voiced their understanding and motivation to adhere to these recommendations.

## 2021-01-07 NOTE — Assessment & Plan Note (Signed)
Chronic, stable.  Continue current inhaler regimen and adjust as needed, recommend he use inhaler daily vs as needed.  Unable to attain spirometry, he is unable to follow instructions for use.

## 2021-01-07 NOTE — Assessment & Plan Note (Signed)
Chronic, ongoing.  Continue current medication regimen and adjust as needed.  Return in 3 months for visit.  Lipid panel next visit, statin increased recently and LDL last visit in 120 range.

## 2021-01-12 ENCOUNTER — Telehealth: Payer: Self-pay

## 2021-01-12 NOTE — Telephone Encounter (Signed)
Noted, will also alert Jasmine on CCM team -- this patient requires SW involvement -- Brooke followed him closely and monitored.

## 2021-01-12 NOTE — Telephone Encounter (Signed)
Copied from [place] [id number]. Topic: General - Other >> Jan 12, 2021 10:34 AM Loma [place] wrote: Reason for CRM: Pt wants  return call as he states he fired his Education officer, [employer] today and she is threatening to get him removed from his home. Wanted to make his dr aware. [phone]

## 2021-01-14 ENCOUNTER — Other Ambulatory Visit: Payer: Self-pay | Admitting: *Deleted

## 2021-01-15 ENCOUNTER — Other Ambulatory Visit: Payer: Self-pay

## 2021-01-15 ENCOUNTER — Emergency Department
Admission: EM | Admit: 2021-01-15 | Discharge: 2021-01-15 | Disposition: A | Discharge status: Home / Self Care | Payer: Medicare Other | Attending: Emergency Medicine | Admitting: Emergency Medicine

## 2021-01-15 DIAGNOSIS — Z79899 Other long term (current) drug therapy: Secondary | ICD-10-CM | POA: Diagnosis not present

## 2021-01-15 DIAGNOSIS — Z955 Presence of coronary angioplasty implant and graft: Secondary | ICD-10-CM | POA: Insufficient documentation

## 2021-01-15 DIAGNOSIS — R12 Heartburn: Secondary | ICD-10-CM | POA: Insufficient documentation

## 2021-01-15 DIAGNOSIS — I110 Hypertensive heart disease with heart failure: Secondary | ICD-10-CM | POA: Diagnosis not present

## 2021-01-15 DIAGNOSIS — Z7982 Long term (current) use of aspirin: Secondary | ICD-10-CM | POA: Insufficient documentation

## 2021-01-15 DIAGNOSIS — Z794 Long term (current) use of insulin: Secondary | ICD-10-CM | POA: Insufficient documentation

## 2021-01-15 DIAGNOSIS — Z8546 Personal history of malignant neoplasm of prostate: Secondary | ICD-10-CM | POA: Diagnosis not present

## 2021-01-15 DIAGNOSIS — F419 Anxiety disorder, unspecified: Secondary | ICD-10-CM | POA: Diagnosis present

## 2021-01-15 DIAGNOSIS — E11319 Type 2 diabetes mellitus with unspecified diabetic retinopathy without macular edema: Secondary | ICD-10-CM | POA: Insufficient documentation

## 2021-01-15 DIAGNOSIS — Z7952 Long term (current) use of systemic steroids: Secondary | ICD-10-CM | POA: Insufficient documentation

## 2021-01-15 DIAGNOSIS — K219 Gastro-esophageal reflux disease without esophagitis: Secondary | ICD-10-CM | POA: Diagnosis not present

## 2021-01-15 DIAGNOSIS — J45909 Unspecified asthma, uncomplicated: Secondary | ICD-10-CM | POA: Diagnosis not present

## 2021-01-15 DIAGNOSIS — I2510 Atherosclerotic heart disease of native coronary artery without angina pectoris: Secondary | ICD-10-CM | POA: Diagnosis not present

## 2021-01-15 DIAGNOSIS — I252 Old myocardial infarction: Secondary | ICD-10-CM | POA: Diagnosis not present

## 2021-01-15 DIAGNOSIS — Z8616 Personal history of COVID-19: Secondary | ICD-10-CM | POA: Diagnosis not present

## 2021-01-15 DIAGNOSIS — J449 Chronic obstructive pulmonary disease, unspecified: Secondary | ICD-10-CM | POA: Insufficient documentation

## 2021-01-15 DIAGNOSIS — R42 Dizziness and giddiness: Secondary | ICD-10-CM | POA: Diagnosis not present

## 2021-01-15 DIAGNOSIS — I5032 Chronic diastolic (congestive) heart failure: Secondary | ICD-10-CM | POA: Diagnosis not present

## 2021-01-15 LAB — COMPREHENSIVE METABOLIC PANEL
ALT: 17 U/L (ref 0–44)
AST: 23 U/L (ref 15–41)
Albumin: 4.2 g/dL (ref 3.5–5.0)
Alkaline Phosphatase: 77 U/L (ref 38–126)
Anion gap: 10 (ref 5–15)
BUN: 18 mg/dL (ref 8–23)
CO2: 20 mmol/L — ABNORMAL LOW (ref 22–32)
Calcium: 9.4 mg/dL (ref 8.9–10.3)
Chloride: 107 mmol/L (ref 98–111)
Creatinine, Ser: 0.82 mg/dL (ref 0.61–1.24)
GFR, Estimated: >60 mL/min (ref >60)
Glucose, Bld: 119 mg/dL — ABNORMAL HIGH (ref 70–99)
Potassium: 4 mmol/L (ref 3.5–5.1)
Sodium: 137 mmol/L (ref 135–145)
Total Bilirubin: 0.9 mg/dL (ref 0.3–1.2)
Total Protein: 7.7 g/dL (ref 6.5–8.1)

## 2021-01-15 LAB — CBC
HCT: 46.1 % (ref 39.0–52.0)
Hemoglobin: 14.8 g/dL (ref 13.0–17.0)
MCH: 28.1 pg (ref 26.0–34.0)
MCHC: 32.1 g/dL (ref 30.0–36.0)
MCV: 87.6 fL (ref 80.0–100.0)
Platelets: 271 10*3/uL (ref 150–400)
RBC: 5.26 MIL/uL (ref 4.22–5.81)
RDW: 15.9 % — ABNORMAL HIGH (ref 11.5–15.5)
WBC: 10.6 10*3/uL — ABNORMAL HIGH (ref 4.0–10.5)
nRBC: 0 % (ref 0.0–0.2)

## 2021-01-15 LAB — ETHANOL: Alcohol, Ethyl (B): <10 mg/dL (ref <10)

## 2021-01-15 LAB — URINE DRUG SCREEN, QUALITATIVE (ARMC ONLY)
Amphetamines, Ur Screen: NOT DETECTED
Barbiturates, Ur Screen: NOT DETECTED
Benzodiazepine, Ur Scrn: NOT DETECTED
Cannabinoid 50 Ng, Ur ~~LOC~~: NOT DETECTED
Cocaine Metabolite,Ur ~~LOC~~: NOT DETECTED
MDMA (Ecstasy)Ur Screen: NOT DETECTED
Methadone Scn, Ur: NOT DETECTED
Opiate, Ur Screen: NOT DETECTED
Phencyclidine (PCP) Ur S: NOT DETECTED
Tricyclic, Ur Screen: NOT DETECTED

## 2021-01-15 LAB — TROPONIN I (HIGH SENSITIVITY)
Troponin I (High Sensitivity): 18 ng/L — ABNORMAL HIGH (ref <18)
Troponin I (High Sensitivity): 23 ng/L — ABNORMAL HIGH (ref <18)
Troponin I (High Sensitivity): 25 ng/L — ABNORMAL HIGH (ref <18)

## 2021-01-15 LAB — SALICYLATE LEVEL: Salicylate Lvl: <7 mg/dL — ABNORMAL LOW (ref 7.0–30.0)

## 2021-01-15 LAB — ACETAMINOPHEN LEVEL: Acetaminophen (Tylenol), Serum: <10 ug/mL — ABNORMAL LOW (ref 10–30)
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
## 2021-01-15 NOTE — ED Notes (Signed)
Pt discharged home. VS stable. All belongings returned to patient. Discharge instructions reviewed with patient.

## 2021-01-15 NOTE — ED Notes (Signed)
Pt appears to be more of a medical patient at this time.  No psych consult needed.

## 2021-01-15 NOTE — ED Notes (Signed)
Pt dressed out at this time- Pt belongings include:  [employer] body bag w/ keys Blue t-shirt Grey shorts Blue/ black shoes ziplock bag with Actor phone

## 2021-01-15 NOTE — ED Provider Notes (Addendum)
[facility] Emergency Department Provider Note  ____________________________________________   Event Date/Time   First MD Initiated Contact with Patient 01/15/21 1219     (approximate)  I have reviewed the triage vital signs and the nursing notes.   HISTORY  Chief Complaint Anxiety    HPI Evan [name] is a 65 y.o. male with cad, chf, A. fib who comes in for anxiety.  Patient reports chronic anxiety but denies any concerns but need to see psychiatry.  No SI or HI or hallucinations.  Denies any alcohol or drug use.  He states the main reason that he is here today is because he is upset with his home health aide who reportedly used Clorox on the floors and he is concerned about pain on the bottom of his feet from walking on it.  He is also concerned of a still some of his checks.  He states that he is already fired her.  He also reports some chronic acid reflux, burning sensation in his chest.  He states that today he started having really bad vertigo and started feeling dizzy and this lasted for a few minutes and then went away.  He went to make sure that he was not having issues with his heart.  Patient says the dizziness is now gone away.       Past Medical History:  Diagnosis Date   Anginal pain ([place])    Anxiety disorder    Asthma    Atresia of esophagus without fistula    CAD (coronary artery disease)    Cellulitis    CHF (congestive heart failure) (HCC)    NYHA CLASS III,CHRONIC,DIASTOLIC   COPD (chronic obstructive pulmonary disease) ([place])    COVID-19    Diabetes mellitus without complication (HCC)    Edema    RIGHT LOWER LEG   Gastroesophageal reflux    H/O: GI bleed    History of pneumonia    Remote   History of scarlet fever    Childhood   Hyperlipidemia    Hypertension    Myocardial infarction ([place]) 2009   Obesity    Obstructive sleep apnea    Pain    CHRONIC BACK / ABDOMINAL   Panic disorder    Peripheral venous  insufficiency    PTSD (post-traumatic stress disorder)    Retinopathy    DIABETIC   Stasis, venous    Stroke [facility])    Vertigo     Patient Active Problem List   Diagnosis Date Noted   Other thrombophilia ([place]) 12/25/2020   Vitamin D deficiency 12/04/2020   Vitamin B12 deficiency 12/04/2020   Stenosis of right carotid artery 12/03/2020   History of CVA (cerebrovascular accident) 11/06/2020   History of 2019 novel coronavirus disease (COVID-19) 09/27/2020   Atherosclerosis of aorta ([place]) 12/04/2019   Persistent atrial fibrillation ([place]) 09/12/2019   CAD (coronary artery disease) 09/12/2019   Chronic venous stasis 09/12/2019   Hoarding disorder 09/12/2019   Cervical spinal stenosis 09/12/2019   Diabetic retinopathy ([place]) 09/12/2019   COPD, mild ([place]) 09/12/2019   Osteoporosis 09/09/2019   History of prostate cancer 09/09/2019   Anxiety 08/10/2019   Obstructive sleep apnea on CPAP 08/10/2019   Hyperlipidemia associated with type 2 diabetes mellitus ([place]) 08/10/2019   Chronic diastolic CHF (congestive heart failure) ([place]) 01/09/2014   Esophageal dysmotility 09/12/2013   Type 2 diabetes mellitus with morbid obesity ([place]) 02/14/2012   Obesity, morbid (more than 100 lbs over ideal weight or BMI >  40) ([place]) 07/26/2011   OLD MYOCARDIAL INFARCTION 03/02/2010   Hypertension associated with diabetes ([place]) 03/20/2009   CORONARY ATHEROSCLEROSIS, ARTERY BYPASS GRAFT 03/20/2009    Past Surgical History:  Procedure Laterality Date   CARDIAC CATHETERIZATION     CATARACT EXTRACTION Left    CATARACT EXTRACTION W/PHACO Right 05/03/2017   Procedure: CATARACT EXTRACTION PHACO AND INTRAOCULAR LENS PLACEMENT ([place]);  Surgeon: Leandrew Koyanagi, MD;  Location: [facility] ORS;  Service: Ophthalmology;  Laterality: Right;  [place] 00:35.3 AP% 12.3 CDE 4.33 Fluid Pack lot # [id number]        COLONOSCOPY WITH PROPOFOL N/A 11/26/2020   Procedure: COLONOSCOPY WITH PROPOFOL;  Surgeon: Jonathon Bellows, MD;   Location[facility] ENDOSCOPY;  Service: Gastroenterology;  Laterality: N/A;  ANNETTE TO PICK UP [phone]   CORONARY ANGIOPLASTY WITH STENT PLACEMENT  2002   CORONARY ANGIOPLASTY WITH STENT PLACEMENT  1999   CORONARY ARTERY BYPASS GRAFT     x7   ESOPHAGOGASTRODUODENOSCOPY N/A 09/19/2016   Procedure: ESOPHAGOGASTRODUODENOSCOPY (EGD);  Surgeon: Lollie Sails, MD;  Location: [facility] ENDOSCOPY;  Service: Endoscopy;  Laterality: N/A;   ESOPHAGOGASTRODUODENOSCOPY (EGD) WITH PROPOFOL  11/26/2020   Procedure: ESOPHAGOGASTRODUODENOSCOPY (EGD) WITH PROPOFOL;  Surgeon: Jonathon Bellows, MD;  Location: [facility] ENDOSCOPY;  Service: Gastroenterology;;    Prior to Admission medications   Medication Sig Start Date End Date Taking? Authorizing Provider  acetaminophen (TYLENOL) 500 MG tablet Take 500 mg by mouth every 6 (six) hours as needed.    [provider]  aspirin 81 MG chewable tablet Chew 1 tablet (81 mg total) by mouth daily. 11/23/20   [name], Henrine Screws T, NP  atorvastatin (LIPITOR) 80 MG tablet Take 1 tablet (80 mg total) by mouth daily. 11/26/20 02/19/22  [name], Henrine Screws T, NP  Blood Glucose Monitoring Suppl (ONETOUCH VERIO) w/Device KIT Use to check blood sugar 3 to 4 times a day and document.  Please bring to visits for review. 07/08/20   [name], Henrine Screws T, NP  budesonide-formoterol (SYMBICORT) 80-4.5 MCG/ACT inhaler Inhale 2 puffs into the lungs 2 (two) times daily. 09/24/20   [name] T, NP  Cholecalciferol 125 MCG (5000 UT) TABS Take 1 tablet (5,000 Units total) by mouth daily. 12/04/20   [name], NP  Elastic Bandages & Supports (MEDICAL COMPRESSION STOCKINGS) MISC 1 Package by Does not apply route daily. 08/28/20   Sable Feil, PA-C  empagliflozin (JARDIANCE) 10 MG TABS tablet Take 1 tablet (10 mg total) by mouth daily. 07/08/20   [name], Henrine Screws T, NP  ezetimibe (ZETIA) 10 MG tablet Take 1 tablet (10 mg total) by mouth daily. 10/07/20   [name], Henrine Screws T, NP  furosemide (LASIX) 40 MG  tablet TAKE 2 TABLETS BY MOUTH ONCE EVERY MORNING AND 1 TABLET ONCE EVERY EVENING 05/18/20   [name], [name] T, NP  glucose blood (ONETOUCH VERIO) test strip Use to check blood sugar 3 to 4 times a day. 07/08/20   [name], Henrine Screws T, NP  metoprolol succinate (TOPROL XL) 50 MG 24 hr tablet Take 1 tablet (50 mg total) by mouth daily. 07/08/20   [name], NP  MODERNA COVID-19 VACCINE 100 MCG/0.5ML injection  08/28/20   [provider]  nitroGLYCERIN (NITROSTAT) 0.4 MG SL tablet Place 1 tablet (0.4 mg total) under the tongue every 5 (five) minutes as needed for chest pain. 10/10/19   [name], Henrine Screws T, NP  olmesartan (BENICAR) 5 MG tablet Take 2 tablets (10 mg total) by mouth daily. 12/03/20   [name], NP  Omega-3 Fatty  Acids (OMEGA 3 500 PO) Take 500 mg by mouth daily.    [provider]  OneTouch Delica Lancets 48G MISC Use to check blood sugar 2-3 times a day. 09/29/20   [name], Henrine Screws T, NP  pantoprazole (PROTONIX) 40 MG tablet Take 1 tablet (40 mg total) by mouth daily. 04/08/20   [name], Henrine Screws T, NP  risperiDONE (RISPERDAL) 0.5 MG tablet Take 1 tablet (0.5 mg total) by mouth 2 (two) times daily. 07/08/20   [name], Henrine Screws T, NP  rivaroxaban (XARELTO) 20 MG TABS tablet Take 1 tablet (20 mg total) by mouth at bedtime. 07/08/20   [name] T, NP  sharps container 1 each by Does not apply route as needed. 09/29/20   [name], NP  Skin Protectants, Misc. (EUCERIN) cream Apply topically as needed for dry skin. 08/28/20   Sable Feil, PA-C  triamcinolone cream (KENALOG) 0.1 % APPLY TO AFFECTED AREA(s) TWICE DAILY 12/29/20   [name], Henrine Screws T, NP  vitamin B-12 (CYANOCOBALAMIN) 1000 MCG tablet Take 1 tablet (1,000 mcg total) by mouth daily. 12/04/20   [name] T, NP    Allergies Penicillin g, Sulfa antibiotics, Tiotropium, and Zoloft [sertraline hcl]  Family History  Problem Relation Age of Onset   Heart attack Mother    Hypertension Mother     Hyperlipidemia Mother    Heart attack Brother 77       MI   Coronary artery disease Other     Social History Social History   Tobacco Use   Smoking status: Never   Smokeless tobacco: Never  Vaping Use   Vaping Use: Never used  Substance Use Topics   Alcohol use: No   Drug use: No      Review of Systems Constitutional: No fever/chills Eyes: No visual changes. ENT: No sore throat. Cardiovascular: Denies chest pain.  Positive dizziness Respiratory: Denies shortness of breath. Gastrointestinal: No abdominal pain.  No nausea, no vomiting.  No diarrhea.  No constipation. Genitourinary: Negative for dysuria. Musculoskeletal: Negative for back pain.  Positive for feet pain Skin: Negative for rash. Neurological: Negative for headaches, focal weakness or numbness. All other ROS negative ____________________________________________   PHYSICAL EXAM:  VITAL SIGNS: ED Triage Vitals  Enc Vitals Group     BP 01/15/21 1151 (!) 171/97     Pulse Rate 01/15/21 1149 (!) 115     Resp 01/15/21 1149 17     Temp 01/15/21 1149 (!) 97.4 F (36.3 C)     Temp Source 01/15/21 1149 Oral     SpO2 01/15/21 1149 95 %     Weight 01/15/21 1149 227 lb 1.2 oz (103 kg)     Height 01/15/21 1149 5' 5"  (1.651 m)     Head Circumference --      Peak Flow --      Pain Score 01/15/21 1149 0     Pain Loc --      Pain Edu? --      Excl. in [place]? --     Constitutional: Alert and oriented. Well appearing and in no acute distress. Eyes: Conjunctivae are normal. No swelling around eyes Head: Atraumatic. Nose: No congestion/rhinnorhea. Mouth/Throat: Mucous membranes are moist.   Neck: No stridor. Trachea Midline. FROM Cardiovascular: Irregular, no swelling noted Respiratory: No increased wob, no stridor Gastrointestinal: Soft and nontender. No distention. No abdominal bruits.  Musculoskeletal: No lower extremity tenderness nor edema.  No joint effusions.  2+ distal pulses in his feet.  Patient's feet  does  have some scattered amounts of dirt.  But no obvious blister. Neurologic:  Normal speech and language. No gross focal neurologic deficits are appreciated.  Cranial nerves are intact 2 through 12.  Equal strength in arms and legs.  No pronator drift noted.  Finger-to-nose normal. Skin:  Skin is warm, dry and intact. No rash noted. Psychiatric: Positive anxiety but denies any SI or HI GU: Deferred   ____________________________________________   LABS (all labs ordered are listed, but only abnormal results are displayed)  Labs Reviewed  COMPREHENSIVE METABOLIC PANEL - Abnormal; Notable for the following components:      Result Value   CO2 20 (*)    Glucose, Bld 119 (*)    All other components within normal limits  SALICYLATE LEVEL - Abnormal; Notable for the following components:   Salicylate Lvl <7.4 (*)    All other components within normal limits  ACETAMINOPHEN LEVEL - Abnormal; Notable for the following components:   Acetaminophen (Tylenol), Serum <10 (*)    All other components within normal limits  CBC - Abnormal; Notable for the following components:   WBC 10.6 (*)    RDW 15.9 (*)    All other components within normal limits  ETHANOL  URINE DRUG SCREEN, QUALITATIVE ([facility] ONLY)   ____________________________________________   ED ECG REPORT I, Vanessa Breda, the attending physician, personally viewed and interpreted this ECG.  Atrial fibrillation rate of 94, no ST elevations, no T wave versions but does have a left bundle branch block with occasional PVC ____________________________________________     PROCEDURES  Procedure(s) performed (including Critical Care):  Procedures   ____________________________________________   INITIAL IMPRESSION / ASSESSMENT AND PLAN / ED COURSE  Alister L Graig was evaluated in Emergency Department on 01/15/2021 for the symptoms described in the history of present illness. He was evaluated in the context of the global COVID-19  pandemic, which necessitated consideration that the patient might be at risk for infection with the SARS-CoV-2 virus that causes COVID-19. Institutional protocols and algorithms that pertain to the evaluation of patients at risk for COVID-19 are in a state of rapid change based on information released by regulatory bodies including the CDC and federal and state organizations. These policies and algorithms were followed during the patient's care in the ED.    Patient is a 65 year old who comes in with concerns for dizzy episode.  EKG shows his chronic A. fib.  Does have some occasional PVCs.  Will get cardiac markers to make sure no evidence of ACS.  He states that his symptoms have not resolved does report a history of vertigo so that I suspect that this is just from that.  He denies any chest pain at this time, no abdominal pain, no shortness of breath.  He does report some pain on the bottom of his feet from walking on [place] I do not see blisters.  Got good distal pulses.  He does have a lot of dirt on the bottom of his feet.  He states that is because he did not put his shoes on prior to coming here and he walked through the door.  Patient was initially thought to be psychiatric evaluation but he denies any SI or significant anxiety and declines psychiatric evaluation.  Patient states that he just want to make sure that he did not have a heart attack or stroke.  He denies any of his symptoms at this time.  His neuro exam is completely normal, finger-to-nose is normal.  No recurrent  dizziness to suggest posterior stroke.  And he has a history of vertigo.  His heart markers are slightly elevated but there was sent out at the same time and not sure that could have been mixed up therefore we will get 1 more cardiac marker just to make sure is not continue to trend up significantly.  Patient handed off to oncoming team pending repeat cardiac markers and potential discharge home.  Although his cardiac markers are  slightly elevated they are similar to when he was here last time with some dehydration.  I did review patient's primary doctor's note on 6/3 that stated that patient had anxiety with some grandiose thinking that was chronic in nature. I  Did notice that patient did have some of these strange thought processes but he is alert and oriented x3 denies any thoughts of hurting himself and declines psychiatric evaluation at this time. He seems to have capacity to this and is consistent in his story of why he is here.  He does not have criteria for me to IVC him.  Does not seem to be a danger to self or to others.  Encouraged him to continue talk to his primary care doctor about this.  On reevaluation I did update patient on this plan and he was talking to the security guard and stated that he was feeling much better and felt comfortable with this plan        ____________________________________________   FINAL CLINICAL IMPRESSION(S) / ED DIAGNOSES   Final diagnoses:  Dizziness      MEDICATIONS GIVEN DURING THIS VISIT:  Medications - No data to display   ED Discharge Orders     None        Note:  This document was prepared using Dragon voice recognition software and may include unintentional dictation errors.    Vanessa [place], MD 01/15/21 1523    Vanessa [place], MD 01/15/21 1524

## 2021-01-15 NOTE — ED Notes (Signed)
Evan [name]: [phone]

## 2021-01-15 NOTE — ED Notes (Signed)
Pt dressing for discharge.  

## 2021-01-17 ENCOUNTER — Other Ambulatory Visit: Payer: Self-pay

## 2021-01-17 ENCOUNTER — Ambulatory Visit (INDEPENDENT_AMBULATORY_CARE_PROVIDER_SITE_OTHER): Payer: Medicare Other | Admitting: Licensed Clinical Social Worker

## 2021-01-17 ENCOUNTER — Encounter: Payer: Self-pay | Admitting: Cardiovascular Disease

## 2021-01-17 ENCOUNTER — Ambulatory Visit (INDEPENDENT_AMBULATORY_CARE_PROVIDER_SITE_OTHER): Payer: Medicare Other | Admitting: Cardiovascular Disease

## 2021-01-17 VITALS — BP 140/80 | HR 92 | Ht 65.0 in | Wt 223.0 lb

## 2021-01-17 DIAGNOSIS — E1169 Type 2 diabetes mellitus with other specified complication: Secondary | ICD-10-CM | POA: Diagnosis not present

## 2021-01-17 DIAGNOSIS — I4819 Other persistent atrial fibrillation: Secondary | ICD-10-CM

## 2021-01-17 DIAGNOSIS — I152 Hypertension secondary to endocrine disorders: Secondary | ICD-10-CM | POA: Diagnosis not present

## 2021-01-17 DIAGNOSIS — E119 Type 2 diabetes mellitus without complications: Secondary | ICD-10-CM | POA: Diagnosis not present

## 2021-01-17 DIAGNOSIS — J449 Chronic obstructive pulmonary disease, unspecified: Secondary | ICD-10-CM

## 2021-01-17 DIAGNOSIS — K219 Gastro-esophageal reflux disease without esophagitis: Secondary | ICD-10-CM

## 2021-01-17 DIAGNOSIS — I10 Essential (primary) hypertension: Secondary | ICD-10-CM | POA: Diagnosis not present

## 2021-01-17 DIAGNOSIS — E1159 Type 2 diabetes mellitus with other circulatory complications: Secondary | ICD-10-CM | POA: Diagnosis not present

## 2021-01-17 DIAGNOSIS — I5032 Chronic diastolic (congestive) heart failure: Secondary | ICD-10-CM | POA: Diagnosis not present

## 2021-01-17 DIAGNOSIS — F419 Anxiety disorder, unspecified: Secondary | ICD-10-CM | POA: Diagnosis not present

## 2021-01-17 DIAGNOSIS — I25118 Atherosclerotic heart disease of native coronary artery with other forms of angina pectoris: Secondary | ICD-10-CM

## 2021-01-17 NOTE — Patient Instructions (Signed)
Medication Instructions:  No changes  If you need a refill on your cardiac medications before your next appointment, please call your pharmacy.    Lab work: No new labs needed   If you have labs (blood work) drawn today and your tests are completely normal, you will receive your results only by: . MyChart Message (if you have MyChart) OR . A paper copy in the mail If you have any lab test that is abnormal or we need to change your treatment, we will call you to review the results.   Testing/Procedures: No new testing needed   Follow-Up: At [facility], you and your health needs are our priority.  As part of our continuing mission to provide you with exceptional heart care, we have created designated Provider Care Teams.  These Care Teams include your primary Cardiologist (physician) and Advanced Practice Providers (APPs -  Physician Assistants and Nurse Practitioners) who all work together to provide you with the care you need, when you need it.  . You will need a follow up appointment in 12 months  . Providers on your designated Care Team:   . Christopher Berge, NP . Ryan Dunn, PA-C . Jacquelyn Visser, PA-C  Any Other Special Instructions Will Be Listed Below (If Applicable).  COVID-19 Vaccine Information can be found at: [email or web address] For questions related to vaccine distribution or appointments, please email [email or web address] or call [phone].     

## 2021-01-17 NOTE — Telephone Encounter (Signed)
Pt has apt on 01/28/2021 pt verbalized understanding

## 2021-01-17 NOTE — Patient Instructions (Signed)
Visit Information   Goals Addressed               This Visit's Progress     Patient Stated     SW-"I need more help." (pt-stated)   On track     Patient Goals/Self-Care Activities: Over the next 120 days Attend all scheduled appointments with providers Contact office with any questions or concerns - learn and use visualization or guided imagery - practice relaxation or meditation daily - practice positive thinking and self-talk I will coordinate with your doctor to assist with obtaining a new in-home aid       Other     SW-Track and Manage My Symptoms-Depression   On track     Timeframe:  Long-Range Goal Priority:  High Start Date:   08/09/20                         Expected End Date: 04/06/21                    Follow Up Date- 30 days from 01/17/21  Patient Goals/Self-Care Activities: Over the next 120 days Attend all scheduled appointments with providers Contact office with any questions or concerns - learn and use visualization or guided imagery - practice relaxation or meditation daily - practice positive thinking and self-talk I will coordinate with your doctor to assist with obtaining a new in-home aid         Patient verbalizes understanding of instructions provided today and agrees to view in [place].   Telephone follow up appointment with care management team member scheduled for: a week from 01/17/21  [name], MSW, [email or web address] Phone [phone] 5:34 PM

## 2021-01-17 NOTE — Telephone Encounter (Signed)
Pt called stating that he has not received a call from social worker yet. He states that he has gotten a new SW [id number] [phone]. He states that he is having a lot going on. He states that he is getting threats to get his home cleaned up, but he is having a lot of dizziness when trying to clean. He states that he would like for PCP to note he was seen in ED on 01/15/21. He states that he is needing to have a nurse to come to his home to help him. Please advise.

## 2021-01-17 NOTE — Chronic Care Management (AMB) (Signed)
Chronic Care Management    Clinical Social Work Note  01/17/2021 Name: Evan [name] MRN: [id number] DOB: 30-Sep-1955  [name] Derner is a 65 y.o. year old male who is a primary care patient of [name], [name], NP. The CCM team was consulted to assist the patient with chronic disease management and/or care coordination needs related to: Level of Care Concerns and Mental Health Counseling and Resources.   Engaged with patient by telephone for follow up visit in response to provider referral for social work chronic care management and care coordination services.   Consent to Services:  The patient was given information about Chronic Care Management services, agreed to services, and gave verbal consent prior to initiation of services.  Please see initial visit note for detailed documentation.   Patient agreed to services and consent obtained.   Assessment: Patient is currently experiencing difficulty with managing ongoing stress triggered by strained relationship with housing management, in addition, to difficulty managing chronic health conditions. CCM LCSW contacted pt's preferred aid agency to initiate services. Stress management strategies identified. CCM LCSW contacted Tacy Dura with [facility] [phone]. Ms. Ishmael Holter reported that pt is eligible for in-home services through [facility]. She requests that medical team provide a copy of recent office note and a prior authorization number from Medicaid.  Fax [phone] and call Ms. Hicks to confirm receipt. Once approved, patient will receive services asap.See Care Plan below for interventions and patient self-care activities. Recent life changes /stressors: Housing concerns and difficulty managing health conditions Recommendation: Patient may benefit from, and is in agreement to work with LCSW to address care coordination needs and will continue to work with the clinical team to address health care and disease  management related needs. .  Follow up Plan: Patient would like continued follow-up.  CCM LCSW will follow up with patient within the next week. Patient will call office if needed prior to next encounter.  SDOH (Social Determinants of Health) assessments and interventions performed:    Advanced Directives Status: Not addressed in this encounter.  CCM Care Plan  Allergies  Allergen Reactions   Penicillin G Hives   Sulfa Antibiotics Hives   Tiotropium    Zoloft [Sertraline Hcl] Other (See Comments)    Outpatient Encounter Medications as of 01/17/2021  Medication Sig   acetaminophen (TYLENOL) 500 MG tablet Take 500 mg by mouth every 6 (six) hours as needed.   aspirin 81 MG chewable tablet Chew 1 tablet (81 mg total) by mouth daily.   atorvastatin (LIPITOR) 80 MG tablet Take 1 tablet (80 mg total) by mouth daily.   Blood Glucose Monitoring Suppl (ONETOUCH VERIO) w/Device KIT Use to check blood sugar 3 to 4 times a day and document.  Please bring to visits for review.   budesonide-formoterol (SYMBICORT) 80-4.5 MCG/ACT inhaler Inhale 2 puffs into the lungs 2 (two) times daily.   Cholecalciferol 125 MCG (5000 UT) TABS Take 1 tablet (5,000 Units total) by mouth daily.   Elastic Bandages & Supports (MEDICAL COMPRESSION STOCKINGS) MISC 1 Package by Does not apply route daily.   empagliflozin (JARDIANCE) 10 MG TABS tablet Take 1 tablet (10 mg total) by mouth daily.   ezetimibe (ZETIA) 10 MG tablet Take 1 tablet (10 mg total) by mouth daily.   furosemide (LASIX) 40 MG tablet TAKE 2 TABLETS BY MOUTH ONCE EVERY MORNING AND 1 TABLET ONCE EVERY EVENING   glucose blood (ONETOUCH VERIO) test strip Use to check blood sugar 3 to 4 times  a day.   metoprolol succinate (TOPROL XL) 50 MG 24 hr tablet Take 1 tablet (50 mg total) by mouth daily.   MODERNA COVID-19 VACCINE 100 MCG/0.5ML injection    nitroGLYCERIN (NITROSTAT) 0.4 MG SL tablet Place 1 tablet (0.4 mg total) under the tongue every 5 (five) minutes  as needed for chest pain.   olmesartan (BENICAR) 5 MG tablet Take 2 tablets (10 mg total) by mouth daily.   Omega-3 Fatty Acids (OMEGA 3 500 PO) Take 500 mg by mouth daily.   OneTouch Delica Lancets 49Q MISC Use to check blood sugar 2-3 times a day.   pantoprazole (PROTONIX) 40 MG tablet Take 1 tablet (40 mg total) by mouth daily.   risperiDONE (RISPERDAL) 0.5 MG tablet Take 1 tablet (0.5 mg total) by mouth 2 (two) times daily.   rivaroxaban (XARELTO) 20 MG TABS tablet Take 1 tablet (20 mg total) by mouth at bedtime.   sharps container 1 each by Does not apply route as needed.   Skin Protectants, Misc. (EUCERIN) cream Apply topically as needed for dry skin.   triamcinolone cream (KENALOG) 0.1 % APPLY TO AFFECTED AREA(s) TWICE DAILY   vitamin B-12 (CYANOCOBALAMIN) 1000 MCG tablet Take 1 tablet (1,000 mcg total) by mouth daily.   No facility-administered encounter medications on file as of 01/17/2021.    Patient Active Problem List   Diagnosis Date Noted   Other thrombophilia ([place]) 12/25/2020   Vitamin D deficiency 12/04/2020   Vitamin B12 deficiency 12/04/2020   Stenosis of right carotid artery 12/03/2020   History of CVA (cerebrovascular accident) 11/06/2020   History of 2019 novel coronavirus disease (COVID-19) 09/27/2020   Atherosclerosis of aorta ([place]) 12/04/2019   Persistent atrial fibrillation ([place]) 09/12/2019   CAD (coronary artery disease) 09/12/2019   Chronic venous stasis 09/12/2019   Hoarding disorder 09/12/2019   Cervical spinal stenosis 09/12/2019   Diabetic retinopathy ([place]) 09/12/2019   COPD, mild ([place]) 09/12/2019   Osteoporosis 09/09/2019   History of prostate cancer 09/09/2019   Anxiety 08/10/2019   Obstructive sleep apnea on CPAP 08/10/2019   Hyperlipidemia associated with type 2 diabetes mellitus ([place]) 08/10/2019   Chronic diastolic CHF (congestive heart failure) ([place]) 01/09/2014   Esophageal dysmotility 09/12/2013   Type 2 diabetes mellitus with morbid obesity  ([place]) 02/14/2012   Obesity, morbid (more than 100 lbs over ideal weight or BMI > 40) ([place]) 07/26/2011   OLD MYOCARDIAL INFARCTION 03/02/2010   Hypertension associated with diabetes ([place]) 03/20/2009   CORONARY ATHEROSCLEROSIS, ARTERY BYPASS GRAFT 03/20/2009    Conditions to be addressed/monitored: Anxiety; ADL IADL limitations and Mental Health Concerns   Care Plan : General Social Work (Adult)  Updates made by [name] D, LCSW since 01/17/2021 12:00 AM     Problem: Response to Treatment (Depression)      Goal: Response to Treatment Maximized   Start Date: 12/27/2020  This Visit's Progress: On track  Recent Progress: On track  Priority: High  Note:   Current barriers:   Acute Mental Health needs related to Anxiety Housing barriers, Level of care concerns, and Mental Health Concerns  Needs Support, Education, and Care Coordination in order to meet unmet mental health needs. Clinical Goal(s): Over the next 120 days, patient will work with SW to reduce or manage symptoms of anxiety and increase knowledge and/or ability of: coping skills, healthy habits, self-management skills, and stress reduction.until connected for ongoing counseling.  Clinical Interventions:  Assessed patient's previous treatment, needs, coping skills, current treatment, support system and  barriers to care Patient interviewed and appropriate assessments performed Provided mental health counseling with regard to managing anxiety Patient reports that he is under a lot of stress. Feels unsafe, stating "I feel like housing will evict me at anytime" Landlord stated to him "if you can walk, you can clean your house" Patient reports that he can't clean due to his legs collapsing and blood was "gushing" out of his feet. Patient reports that he has pictures regarding this event Patient reports that management claimed he didnt pay rent, which patient reports is false. States he can't continue with the drama due to healing  from a stroke Jilda Roche claimed that pt was on drugs and selling drugs. LE was called to his residence and transported pt to hospital where he completed a drug screen. Patient stated drug screen was negative. States that "it's all behind him" however this hurt his feelings. He has reported incidents to Wanda LCSW provided support and encouragement. Stress management strategies were identified to assist patient with healhty coping  Patient shared that talking with friend Ms. Hardie Shackleton, has been helpful, with processing feelings Patient is interested in obtaining a new caregiver. His previous aid was let go because of not showing up to work and talking to him inappropriately-CCM LCSW will contact new agency to initiate services Patient enjoys watching movies and drawing to cope with stressors Other interventions: Solution-Focused Strategies, Mindfulness or Relaxation Training, Active listening / Reflection utilized , and Emotional Supportive Provided  Discussed plans with patient for ongoing care management follow up and provided patient with direct contact information for care management team Collaboration with PCP regarding development and update of comprehensive plan of care as evidenced by provider attestation and co-signature Inter-disciplinary care team collaboration (see longitudinal plan of care) Patient Goals/Self-Care Activities: Over the next 120 days Attend all scheduled appointments with providers Contact office with any questions or concerns - learn and use visualization or guided imagery - practice relaxation or meditation daily - practice positive thinking and self-talk I will coordinate with your doctor to assist with obtaining a new in-home aid       [name], MSW, [email or web address] Phone [phone] 5:17 PM

## 2021-01-18 DIAGNOSIS — Z5189 Encounter for other specified aftercare: Secondary | ICD-10-CM | POA: Diagnosis not present

## 2021-01-20 ENCOUNTER — Ambulatory Visit: Payer: Medicare Other | Admitting: Neurology

## 2021-01-20 ENCOUNTER — Other Ambulatory Visit: Payer: Self-pay | Admitting: Nurse Practitioner

## 2021-01-21 ENCOUNTER — Ambulatory Visit: Payer: Medicare Other | Admitting: Licensed Clinical Social Worker

## 2021-01-21 ENCOUNTER — Telehealth: Payer: Self-pay

## 2021-01-21 DIAGNOSIS — F419 Anxiety disorder, unspecified: Secondary | ICD-10-CM

## 2021-01-21 DIAGNOSIS — I152 Hypertension secondary to endocrine disorders: Secondary | ICD-10-CM

## 2021-01-21 NOTE — Telephone Encounter (Signed)
-----   Message from [name], NP sent at 01/17/2021  6:56 PM EDT ----- [place] [facility] [place] can you assist with faxing the items needed -- also how do we get a prior auth for this [place]? ----- Message ----- From: Rebekah Chesterfield, LCSW Sent: 01/17/2021   6:15 PM EDT To: [name], NP  Hi [name], I spoke with both patient and the in home agency he prefers to work with for an in-home aid. Ms. Ishmael Holter would like a recent office note and prior authorization number to be faxed (see my note). States CMA should be able to complete with your approval and services could begin asap upon completion. Please let me know if there is anything I can assist with to expedite these services, he definitely needs additional support in the home. Thanks!

## 2021-01-21 NOTE — Patient Instructions (Signed)
Visit Information   Goals Addressed               This Visit's Progress     Patient Stated     SW-"I need more help." (pt-stated)   On track     Patient Goals/Self-Care Activities: Over the next 120 days Attend all scheduled appointments with providers Contact office with any questions or concerns - learn and use visualization or guided imagery - practice relaxation or meditation daily - practice positive thinking and self-talk I will coordinate with your doctor to assist with obtaining a new in-home aid       Other     SW-Track and Manage My Symptoms-Depression   On track     Timeframe:  Long-Range Goal Priority:  High Start Date:   08/09/20                         Expected End Date: 04/06/21                    Follow Up Date- 30 days from 01/21/21  Patient Goals/Self-Care Activities: Over the next 120 days Attend all scheduled appointments with providers Contact office with any questions or concerns - learn and use visualization or guided imagery - practice relaxation or meditation daily - practice positive thinking and self-talk I will coordinate with your doctor to assist with obtaining a new in-home aid         Patient verbalizes understanding of instructions provided today and agrees to view in [place].   The patient has been provided with contact information for the care management team and has been advised to call with any health related questions or concerns.   [name], MSW, [email or web address] Phone [phone] 3:05 PM

## 2021-01-21 NOTE — Chronic Care Management (AMB) (Signed)
Chronic Care Management    Clinical Social Work Note  01/21/2021 Name: TALEN POSER MRN: [id number] DOB: 1955-08-09  [name] Whang is a 65 y.o. year old male who is a primary care patient of [name], [name], NP. The CCM team was consulted to assist the patient with chronic disease management and/or care coordination needs related to: Level of Care Concerns and Mental Health Counseling and Resources.   Engaged with patient by telephone for follow up visit in response to provider referral for social work chronic care management and care coordination services.   Consent to Services:  The patient was given information about Chronic Care Management services, agreed to services, and gave verbal consent prior to initiation of services.  Please see initial visit note for detailed documentation.   Patient agreed to services and consent obtained.   Assessment: Patient is engaged in conversation, continues to maintain positive progress with care plan goals.Patient was successful in completing a Change of Agency Form via telephone with [employer]. It will take approximately 10-12 days to complete request. Strategies to assist with management of anxiety symptoms discussed. See Care Plan below for interventions and patient self-care actives. Recent life changes [name]: Management of health conditions Recommendation: Patient may benefit from, and is in agreement to work with LCSW to address care coordination needs and will continue to work with the clinical team to address health care and disease management related needs.  Follow up Plan: Patient would like continued follow-up. Patient will call office if needed prior to next encounter.   SDOH (Social Determinants of Health) assessments and interventions performed:    Advanced Directives Status: Not addressed in this encounter.  CCM Care Plan  Allergies  Allergen Reactions   Penicillin G Hives   Sulfa Antibiotics Hives   Tiotropium     Zoloft [Sertraline Hcl] Other (See Comments)    Outpatient Encounter Medications as of 01/21/2021  Medication Sig   acetaminophen (TYLENOL) 500 MG tablet Take 500 mg by mouth every 6 (six) hours as needed.   aspirin 81 MG chewable tablet Chew 1 tablet (81 mg total) by mouth daily.   atorvastatin (LIPITOR) 80 MG tablet Take 1 tablet (80 mg total) by mouth daily.   Blood Glucose Monitoring Suppl (ONETOUCH VERIO) w/Device KIT Use to check blood sugar 3 to 4 times a day and document.  Please bring to visits for review.   budesonide-formoterol (SYMBICORT) 80-4.5 MCG/ACT inhaler Inhale 2 puffs into the lungs 2 (two) times daily.   Cholecalciferol 125 MCG (5000 UT) TABS Take 1 tablet (5,000 Units total) by mouth daily.   Elastic Bandages & Supports (MEDICAL COMPRESSION STOCKINGS) MISC 1 Package by Does not apply route daily.   empagliflozin (JARDIANCE) 10 MG TABS tablet Take 1 tablet (10 mg total) by mouth daily.   ezetimibe (ZETIA) 10 MG tablet Take 1 tablet (10 mg total) by mouth daily.   furosemide (LASIX) 40 MG tablet TAKE 2 TABLETS BY MOUTH ONCE EVERY MORNING AND 1 TABLET ONCE EVERY EVENING   glucose blood (ONETOUCH VERIO) test strip Use to check blood sugar 3 to 4 times a day.   metoprolol succinate (TOPROL XL) 50 MG 24 hr tablet Take 1 tablet (50 mg total) by mouth daily.   MODERNA COVID-19 VACCINE 100 MCG/0.5ML injection    nitroGLYCERIN (NITROSTAT) 0.4 MG SL tablet Place 1 tablet (0.4 mg total) under the tongue every 5 (five) minutes as needed for chest pain.   olmesartan (BENICAR) 5 MG tablet Take 2  tablets (10 mg total) by mouth daily.   Omega-3 Fatty Acids (OMEGA 3 500 PO) Take 500 mg by mouth daily.   OneTouch Delica Lancets 35K MISC Use to check blood sugar 2-3 times a day.   pantoprazole (PROTONIX) 40 MG tablet Take 1 tablet (40 mg total) by mouth daily.   risperiDONE (RISPERDAL) 0.5 MG tablet Take 1 tablet (0.5 mg total) by mouth 2 (two) times daily.   rivaroxaban (XARELTO) 20 MG  TABS tablet Take 1 tablet (20 mg total) by mouth at bedtime.   sharps container 1 each by Does not apply route as needed.   Skin Protectants, Misc. (EUCERIN) cream Apply topically as needed for dry skin.   triamcinolone cream (KENALOG) 0.1 % APPLY TO AFFECTED AREA(s) TWICE DAILY   vitamin B-12 (CYANOCOBALAMIN) 1000 MCG tablet Take 1 tablet (1,000 mcg total) by mouth daily.   No facility-administered encounter medications on file as of 01/21/2021.    Patient Active Problem List   Diagnosis Date Noted   Other thrombophilia ([place]) 12/25/2020   Vitamin D deficiency 12/04/2020   Vitamin B12 deficiency 12/04/2020   Stenosis of right carotid artery 12/03/2020   History of CVA (cerebrovascular accident) 11/06/2020   History of 2019 novel coronavirus disease (COVID-19) 09/27/2020   Atherosclerosis of aorta ([place]) 12/04/2019   Persistent atrial fibrillation ([place]) 09/12/2019   CAD (coronary artery disease) 09/12/2019   Chronic venous stasis 09/12/2019   Hoarding disorder 09/12/2019   Cervical spinal stenosis 09/12/2019   Diabetic retinopathy ([place]) 09/12/2019   COPD, mild ([place]) 09/12/2019   Osteoporosis 09/09/2019   History of prostate cancer 09/09/2019   Anxiety 08/10/2019   Obstructive sleep apnea on CPAP 08/10/2019   Hyperlipidemia associated with type 2 diabetes mellitus ([place]) 08/10/2019   Chronic diastolic CHF (congestive heart failure) ([place]) 01/09/2014   Esophageal dysmotility 09/12/2013   Type 2 diabetes mellitus with morbid obesity ([place]) 02/14/2012   Obesity, morbid (more than 100 lbs over ideal weight or BMI > 40) ([place]) 07/26/2011   OLD MYOCARDIAL INFARCTION 03/02/2010   Hypertension associated with diabetes ([place]) 03/20/2009   CORONARY ATHEROSCLEROSIS, ARTERY BYPASS GRAFT 03/20/2009    Conditions to be addressed/monitored: Anxiety; Level of care concerns  Care Plan : General Social Work (Adult)  Updates made by Rebekah Chesterfield, LCSW since 01/21/2021 12:00 AM     Problem:  Response to Treatment (Depression)      Goal: Response to Treatment Maximized   Start Date: 12/27/2020  This Visit's Progress: On track  Recent Progress: On track  Priority: High  Note:   Current barriers:   Acute Mental Health needs related to Anxiety Housing barriers, Level of care concerns, and Mental Health Concerns  Needs Support, Education, and Care Coordination in order to meet unmet mental health needs. Clinical Goal(s): Over the next 120 days, patient will work with SW to reduce or manage symptoms of anxiety and increase knowledge and/or ability of: coping skills, healthy habits, self-management skills, and stress reduction.until connected for ongoing counseling.  Clinical Interventions:  Assessed patient's previous treatment, needs, coping skills, current treatment, support system and barriers to care Patient interviewed and appropriate assessments performed Provided mental health counseling with regard to managing anxiety CCM LCSW collaborated with PCP CMA and CFM's Referral Coordinator regarding the completion of [place] Form and how to obtain a Prior Authorization, as recommended by Tacy Dura with [facility] LCSW spoke with [place] with [employer] who shared that a new PCS form is not required.  The previous form is effective and patient would only need to contact them and provide the information regarding his preferred agency to initiate aid services.  CCM LCSW spoke with patient. Patient reported that he becomes anxious and would prefer for CCM LCSW to contact [facility] on a 3-way phone call, in order for LCSW to provide him with additional support. Patient was successful in providing [place], with [employer] the requested information. Darrick Meigs verified that [facility] is on the list of preferred agencies that provide Medicaid services. It will take 10-12 days to process patient's request for a change in  agency Patient verbalized understanding and was appreciative for the assistance CCM LCSW provided support and encouragement. Strategies to assist with management of anxiety symptoms were discussed  Other interventions: Solution-Focused Strategies, Mindfulness or Relaxation Training, Active listening / Reflection utilized , and Emotional Supportive Provided  Discussed plans with patient for ongoing care management follow up and provided patient with direct contact information for care management team Collaboration with PCP regarding development and update of comprehensive plan of care as evidenced by provider attestation and co-signature Inter-disciplinary care team collaboration (see longitudinal plan of care) Patient Goals/Self-Care Activities: Over the next 120 days Attend all scheduled appointments with providers Contact office with any questions or concerns - learn and use visualization or guided imagery - practice relaxation or meditation daily - practice positive thinking and self-talk I will coordinate with your doctor to assist with obtaining a new in-home aid       [name], MSW, [email or web address] Phone [phone] 3:05 PM

## 2021-01-21 NOTE — Telephone Encounter (Signed)
Spoke with [name] about an update about patient receiving Home Health. Per Jasmine "Good news, there is already an updated PCS form on file for the patient. He will need to call them directly and provide the name of the agency he wants. There is no add'l info that we need to provide, so I'll update the pt and PCPtoday." Patient was notified per [facility] and provider [name] was updated as well on patient.

## 2021-01-27 ENCOUNTER — Ambulatory Visit: Payer: Medicare Other | Admitting: Podiatry

## 2021-01-28 ENCOUNTER — Ambulatory Visit: Payer: Medicare Other | Admitting: Nurse Practitioner

## 2021-02-04 ENCOUNTER — Other Ambulatory Visit: Payer: Self-pay

## 2021-02-04 ENCOUNTER — Inpatient Hospital Stay: Payer: Medicare Other | Attending: Radiation Oncology

## 2021-02-04 DIAGNOSIS — C61 Malignant neoplasm of prostate: Secondary | ICD-10-CM | POA: Insufficient documentation

## 2021-02-04 LAB — PSA: Prostatic Specific Antigen: 0.02 ng/mL (ref 0.00–4.00)

## 2021-02-08 ENCOUNTER — Other Ambulatory Visit: Payer: Self-pay

## 2021-02-08 ENCOUNTER — Ambulatory Visit (INDEPENDENT_AMBULATORY_CARE_PROVIDER_SITE_OTHER): Payer: Medicare Other | Admitting: Nurse Practitioner

## 2021-02-08 ENCOUNTER — Ambulatory Visit (INDEPENDENT_AMBULATORY_CARE_PROVIDER_SITE_OTHER): Payer: Medicare Other | Admitting: Licensed Clinical Social Worker

## 2021-02-08 ENCOUNTER — Encounter: Payer: Self-pay | Admitting: Nurse Practitioner

## 2021-02-08 DIAGNOSIS — Z8673 Personal history of transient ischemic attack (TIA), and cerebral infarction without residual deficits: Secondary | ICD-10-CM | POA: Diagnosis not present

## 2021-02-08 DIAGNOSIS — E538 Deficiency of other specified B group vitamins: Secondary | ICD-10-CM

## 2021-02-08 DIAGNOSIS — G4733 Obstructive sleep apnea (adult) (pediatric): Secondary | ICD-10-CM | POA: Diagnosis not present

## 2021-02-08 DIAGNOSIS — E785 Hyperlipidemia, unspecified: Secondary | ICD-10-CM

## 2021-02-08 DIAGNOSIS — I152 Hypertension secondary to endocrine disorders: Secondary | ICD-10-CM | POA: Diagnosis not present

## 2021-02-08 DIAGNOSIS — E1159 Type 2 diabetes mellitus with other circulatory complications: Secondary | ICD-10-CM | POA: Diagnosis not present

## 2021-02-08 DIAGNOSIS — I700 Atherosclerosis of aorta: Secondary | ICD-10-CM

## 2021-02-08 DIAGNOSIS — I878 Other specified disorders of veins: Secondary | ICD-10-CM | POA: Diagnosis not present

## 2021-02-08 DIAGNOSIS — I5032 Chronic diastolic (congestive) heart failure: Secondary | ICD-10-CM

## 2021-02-08 DIAGNOSIS — J449 Chronic obstructive pulmonary disease, unspecified: Secondary | ICD-10-CM

## 2021-02-08 DIAGNOSIS — Z9989 Dependence on other enabling machines and devices: Secondary | ICD-10-CM

## 2021-02-08 DIAGNOSIS — E559 Vitamin D deficiency, unspecified: Secondary | ICD-10-CM

## 2021-02-08 DIAGNOSIS — F419 Anxiety disorder, unspecified: Secondary | ICD-10-CM

## 2021-02-08 DIAGNOSIS — E1169 Type 2 diabetes mellitus with other specified complication: Secondary | ICD-10-CM

## 2021-02-08 LAB — BAYER DCA HB A1C WAIVED: HB A1C (BAYER DCA - WAIVED): 6.3 % (ref <7.0)

## 2021-02-08 NOTE — Assessment & Plan Note (Signed)
Noted on CT 10/29/17.  Continue Crestor and Xarelto which offer preventative.  Recommend he consistently take his medication daily, currently using pill packs.

## 2021-02-08 NOTE — Assessment & Plan Note (Signed)
Chronic, ongoing. Euvolemic today.  Continue collaboration with cardiology and HF clinic. Recommend he take medication consistently. Recommend: - Reminded to call for an overnight weight gain of >2 pounds or a weekly weight weight of >5 pounds - not adding salt to his food and has been reading food labels. Reviewed the importance of keeping daily sodium intake to 2000mg  daily  - Return in 2 months for visit and labs.

## 2021-02-08 NOTE — Assessment & Plan Note (Signed)
Referral to vascular placed recent visit, will reach out to referral team to get him scheduled.

## 2021-02-08 NOTE — Assessment & Plan Note (Signed)
Chronic, ongoing.  Continue supplement and recheck level today. 

## 2021-02-08 NOTE — Assessment & Plan Note (Signed)
Noted recent labs and supplement sent in for pill packs, continue this and recheck level today.

## 2021-02-08 NOTE — Chronic Care Management (AMB) (Signed)
Chronic Care Management    Clinical Social Work Note  02/08/2021 Name: Evan [name] MRN: [id number] DOB: May 30, 1956  Evan [name] is a 65 y.o. year old male who is a primary care patient of [name], [name], NP. The CCM team was consulted to assist the patient with chronic disease management and/or care coordination needs related to: Transportation Needs , Food Insecurity, and Mental Health Counseling and Resources.   Engaged with patient by telephone for follow up visit in response to provider referral for social work chronic care management and care coordination services.   Consent to Services:  The patient was given information about Chronic Care Management services, agreed to services, and gave verbal consent prior to initiation of services.  Please see initial visit note for detailed documentation.   Patient agreed to services and consent obtained.   Assessment: Patient is currently experiencing difficulty with managing health conditions. Has a ride for upcoming cardiology appointment scheduled for 02/11/21. CCM LCSW provided patient with the [facility] transportation number to schedule transportation with upcoming appointments. A new care guide referral was placed to assist patient with application for food stamps. See Care Plan below for interventions and patient self-care actives. Recent life changes /stressors: Transportation, Food insecurity, and management of health conditions Recommendation: Patient may benefit from, and is in agreement to work with LCSW to address care coordination needs and will continue to work with the clinical team to address health care and disease management related needs.  Follow up Plan: Patient would like continued follow-up from CCM LCSW .  Follow up scheduled in on 02/21/21. Patient will call office if needed prior to next encounter.    SDOH (Social Determinants of Health) assessments and interventions performed:    Advanced Directives Status: Not  addressed in this encounter.  CCM Care Plan  Allergies  Allergen Reactions   Penicillin G Hives   Sulfa Antibiotics Hives   Tiotropium    Zoloft [Sertraline Hcl] Other (See Comments)    Outpatient Encounter Medications as of 02/08/2021  Medication Sig   acetaminophen (TYLENOL) 500 MG tablet Take 500 mg by mouth every 6 (six) hours as needed.   aspirin 81 MG chewable tablet Chew 1 tablet (81 mg total) by mouth daily.   atorvastatin (LIPITOR) 80 MG tablet Take 1 tablet (80 mg total) by mouth daily.   Blood Glucose Monitoring Suppl (ONETOUCH VERIO) w/Device KIT Use to check blood sugar 3 to 4 times a day and document.  Please bring to visits for review.   budesonide-formoterol (SYMBICORT) 80-4.5 MCG/ACT inhaler Inhale 2 puffs into the lungs 2 (two) times daily.   Cholecalciferol 125 MCG (5000 UT) TABS Take 1 tablet (5,000 Units total) by mouth daily.   Elastic Bandages & Supports (MEDICAL COMPRESSION STOCKINGS) MISC 1 Package by Does not apply route daily.   empagliflozin (JARDIANCE) 10 MG TABS tablet Take 1 tablet (10 mg total) by mouth daily.   ezetimibe (ZETIA) 10 MG tablet Take 1 tablet (10 mg total) by mouth daily.   furosemide (LASIX) 40 MG tablet TAKE 2 TABLETS BY MOUTH ONCE EVERY MORNING AND 1 TABLET ONCE EVERY EVENING   glucose blood (ONETOUCH VERIO) test strip Use to check blood sugar 3 to 4 times a day.   metoprolol succinate (TOPROL XL) 50 MG 24 hr tablet Take 1 tablet (50 mg total) by mouth daily.   MODERNA COVID-19 VACCINE 100 MCG/0.5ML injection    nitroGLYCERIN (NITROSTAT) 0.4 MG SL tablet Place 1 tablet (0.4 mg total) under  the tongue every 5 (five) minutes as needed for chest pain.   olmesartan (BENICAR) 5 MG tablet Take 2 tablets (10 mg total) by mouth daily.   Omega-3 Fatty Acids (OMEGA 3 500 PO) Take 500 mg by mouth daily.   OneTouch Delica Lancets 83M MISC Use to check blood sugar 2-3 times a day.   pantoprazole (PROTONIX) 40 MG tablet Take 1 tablet (40 mg total) by  mouth daily.   risperiDONE (RISPERDAL) 0.5 MG tablet Take 1 tablet (0.5 mg total) by mouth 2 (two) times daily.   rivaroxaban (XARELTO) 20 MG TABS tablet Take 1 tablet (20 mg total) by mouth at bedtime.   sharps container 1 each by Does not apply route as needed.   Skin Protectants, Misc. (EUCERIN) cream Apply topically as needed for dry skin.   triamcinolone cream (KENALOG) 0.1 % APPLY TO AFFECTED AREA(s) TWICE DAILY   vitamin B-12 (CYANOCOBALAMIN) 1000 MCG tablet Take 1 tablet (1,000 mcg total) by mouth daily.   No facility-administered encounter medications on file as of 02/08/2021.    Patient Active Problem List   Diagnosis Date Noted   Other thrombophilia ([place]) 12/25/2020   Vitamin D deficiency 12/04/2020   Vitamin B12 deficiency 12/04/2020   Stenosis of right carotid artery 12/03/2020   History of CVA (cerebrovascular accident) 11/06/2020   History of 2019 novel coronavirus disease (COVID-19) 09/27/2020   Atherosclerosis of aorta ([place]) 12/04/2019   Persistent atrial fibrillation ([place]) 09/12/2019   CAD (coronary artery disease) 09/12/2019   Chronic venous stasis 09/12/2019   Hoarding disorder 09/12/2019   Cervical spinal stenosis 09/12/2019   Diabetic retinopathy ([place]) 09/12/2019   COPD, mild ([place]) 09/12/2019   Osteoporosis 09/09/2019   History of prostate cancer 09/09/2019   Anxiety 08/10/2019   Obstructive sleep apnea on CPAP 08/10/2019   Hyperlipidemia associated with type 2 diabetes mellitus ([place]) 08/10/2019   Chronic diastolic CHF (congestive heart failure) ([place]) 01/09/2014   Esophageal dysmotility 09/12/2013   Type 2 diabetes mellitus with morbid obesity ([place]) 02/14/2012   Obesity, morbid (more than 100 lbs over ideal weight or BMI > 40) ([place]) 07/26/2011   OLD MYOCARDIAL INFARCTION 03/02/2010   Hypertension associated with diabetes ([place]) 03/20/2009   CORONARY ATHEROSCLEROSIS, ARTERY BYPASS GRAFT 03/20/2009    Conditions to be addressed/monitored: Anxiety;  Transportation, Limited access to food, and Mental Health Concerns   Care Plan : General Social Work (Adult)  Updates made by [name] D, LCSW since 02/08/2021 12:00 AM     Problem: Response to Treatment (Depression)      Goal: Response to Treatment Maximized   Start Date: 12/27/2020  This Visit's Progress: On track  Recent Progress: On track  Priority: High  Note:   Current barriers:   Acute Mental Health needs related to Anxiety Housing barriers, Level of care concerns, and Mental Health Concerns  Needs Support, Education, and Care Coordination in order to meet unmet mental health needs. Clinical Goal(s): Over the next 120 days, patient will work with SW to reduce or manage symptoms of anxiety and increase knowledge and/or ability of: coping skills, healthy habits, self-management skills, and stress reduction.until connected for ongoing counseling.  Clinical Interventions:  Assessed patient's previous treatment, needs, coping skills, current treatment, support system and barriers to care Patient interviewed and appropriate assessments performed Provided mental health counseling with regard to managing anxiety Patient reports difficulty managing anger triggered by the way he was treated by his previous aid. States that he has "lost faith in people; however, has  never lost his faith in God" Patient was contacted by [facility] about the services they provide. He is unaware of when aid services will begin CCM validated patient's feelings and provided encouragement CCM LCSW collaborated with PCP and CMA-CMA shared during conversation with patient, he endorsed assistance with applying for SNAP benefits. Patient also shared that he will need assistance with transportation to upcoming medical appointments CCM LCSW completed a referral to Care Guide to assist with SNAP application CCM LCSW provided patient with [facility] transporation assistance number [phone] and encouraged him to  contact them as soon as he schedules his vascular appointment. Information was also mailed to patient's residence due to his memory concerns, for future reference Patient verbalized understanding and was appreciative for the assistance CCM LCSW provided support and encouragement. Strategies to assist with management of anxiety symptoms were discussed  Other interventions: Solution-Focused Strategies, Mindfulness or Relaxation Training, Active listening / Reflection utilized , and Emotional Supportive Provided  Discussed plans with patient for ongoing care management follow up and provided patient with direct contact information for care management team Collaboration with PCP regarding development and update of comprehensive plan of care as evidenced by provider attestation and co-signature Inter-disciplinary care team collaboration (see longitudinal plan of care) Patient Goals/Self-Care Activities: Over the next 120 days Attend all scheduled appointments with providers Contact office with any questions or concerns [facility] transportation number to assist with attending vascular appointment - learn and use visualization or guided imagery - practice relaxation or meditation daily - practice positive thinking and self-talk I will coordinate with your doctor to assist with obtaining a new in-home aid       [name], MSW, [email or web address] Phone [phone] 1:04 PM

## 2021-02-08 NOTE — Assessment & Plan Note (Signed)
Chronic, ongoing.  Continue current medication regimen and adjust as needed.  Return in 3 months for visit.  Lipid panel todayt, statin increased recently and LDL last visit in 120 range.

## 2021-02-08 NOTE — Assessment & Plan Note (Signed)
Chronic, ongoing with A1C today 6.3% and urine ALB 30 and A:C 30-300 February 2022.  Continue ARB for kidney protection.  Avoid ACE due to COPD.  Continue current Jardiance, benefit with his HF.  A1c and CMP today.  Recommend he check BS at least 3 mornings a week at home.  Return to office in 3 months.  Continue pill packs for consistent medication adherence.

## 2021-02-08 NOTE — Assessment & Plan Note (Signed)
Chronic, ongoing.  Recommend continue 100% use of CPAP.

## 2021-02-08 NOTE — Patient Instructions (Signed)
Diabetes Mellitus and Nutrition, Adult When you have diabetes, or diabetes mellitus, it is very important to have healthy eating habits because your blood sugar (glucose) levels are greatly affected by what you eat and drink. Eating healthy foods in the right amounts, at about the same times every day, can help you:  Control your blood glucose.  Lower your risk of heart disease.  Improve your blood pressure.  Reach or maintain a healthy weight. What can affect my meal plan? Every person with diabetes is different, and each person has different needs for a meal plan. Your health care provider may recommend that you work with a dietitian to make a meal plan that is best for you. Your meal plan may vary depending on factors such as:  The calories you need.  The medicines you take.  Your weight.  Your blood glucose, blood pressure, and cholesterol levels.  Your activity level.  Other health conditions you have, such as heart or kidney disease. How do carbohydrates affect me? Carbohydrates, also called carbs, affect your blood glucose level more than any other type of food. Eating carbs naturally raises the amount of glucose in your blood. Carb counting is a method for keeping track of how many carbs you eat. Counting carbs is important to keep your blood glucose at a healthy level, especially if you use insulin or take certain oral diabetes medicines. It is important to know how many carbs you can safely have in each meal. This is different for every person. Your dietitian can help you calculate how many carbs you should have at each meal and for each snack. How does alcohol affect me? Alcohol can cause a sudden decrease in blood glucose (hypoglycemia), especially if you use insulin or take certain oral diabetes medicines. Hypoglycemia can be a life-threatening condition. Symptoms of hypoglycemia, such as sleepiness, dizziness, and confusion, are similar to symptoms of having too much  alcohol.  Do not drink alcohol if: ? Your health care provider tells you not to drink. ? You are pregnant, may be pregnant, or are planning to become pregnant.  If you drink alcohol: ? Do not drink on an empty stomach. ? Limit how much you use to:  0-1 drink a day for women.  0-2 drinks a day for men. ? Be aware of how much alcohol is in your drink. In the [place] one drink equals one 12 oz bottle of beer (355 mL), one 5 oz glass of wine (148 mL), or one 1 oz glass of hard liquor (44 mL). ? Keep yourself hydrated with water, diet soda, or unsweetened iced tea.  Keep in mind that regular soda, juice, and other mixers may contain a lot of sugar and must be counted as carbs. What are tips for following this plan? Reading food labels  Start by checking the serving size on the "Nutrition Facts" label of packaged foods and drinks. The amount of calories, carbs, fats, and other nutrients listed on the label is based on one serving of the item. Many items contain more than one serving per package.  Check the total grams (g) of carbs in one serving. You can calculate the number of servings of carbs in one serving by dividing the total carbs by 15. For example, if a food has 30 g of total carbs per serving, it would be equal to 2 servings of carbs.  Check the number of grams (g) of saturated fats and trans fats in one serving. Choose foods that have   a low amount or none of these fats.  Check the number of milligrams (mg) of salt (sodium) in one serving. Most people should limit total sodium intake to less than 2,300 mg per day.  Always check the nutrition information of foods labeled as "low-fat" or "nonfat." These foods may be higher in added sugar or refined carbs and should be avoided.  Talk to your dietitian to identify your daily goals for nutrients listed on the label. Shopping  Avoid buying canned, pre-made, or processed foods. These foods tend to be high in fat, sodium, and added  sugar.  Shop around the outside edge of the grocery store. This is where you will most often find fresh fruits and vegetables, bulk grains, fresh meats, and fresh dairy. Cooking  Use low-heat cooking methods, such as baking, instead of high-heat cooking methods like deep frying.  Cook using healthy oils, such as olive, canola, or sunflower oil.  Avoid cooking with butter, cream, or high-fat meats. Meal planning  Eat meals and snacks regularly, preferably at the same times every day. Avoid going long periods of time without eating.  Eat foods that are high in fiber, such as fresh fruits, vegetables, beans, and whole grains. Talk with your dietitian about how many servings of carbs you can eat at each meal.  Eat 4-6 oz (112-168 g) of lean protein each day, such as lean meat, chicken, fish, eggs, or tofu. One ounce (oz) of lean protein is equal to: ? 1 oz (28 g) of meat, chicken, or fish. ? 1 egg. ?  cup (62 g) of tofu.  Eat some foods each day that contain healthy fats, such as avocado, nuts, seeds, and fish.   What foods should I eat? Fruits Berries. Apples. Oranges. Peaches. Apricots. Plums. Grapes. Mango. Papaya. Pomegranate. Kiwi. Cherries. Vegetables Lettuce. Spinach. Leafy greens, including kale, chard, collard greens, and mustard greens. Beets. Cauliflower. Cabbage. Broccoli. Carrots. Green beans. Tomatoes. Peppers. Onions. Cucumbers. Brussels sprouts. Grains Whole grains, such as whole-wheat or whole-grain bread, crackers, tortillas, cereal, and pasta. Unsweetened oatmeal. Quinoa. Brown or wild rice. Meats and other proteins Seafood. Poultry without skin. Lean cuts of poultry and beef. Tofu. Nuts. Seeds. Dairy Low-fat or fat-free dairy products such as milk, yogurt, and cheese. The items listed above may not be a complete list of foods and beverages you can eat. Contact a dietitian for more information. What foods should I avoid? Fruits Fruits canned with  syrup. Vegetables Canned vegetables. Frozen vegetables with butter or cream sauce. Grains Refined white flour and flour products such as bread, pasta, snack foods, and cereals. Avoid all processed foods. Meats and other proteins Fatty cuts of meat. Poultry with skin. Breaded or fried meats. Processed meat. Avoid saturated fats. Dairy Full-fat yogurt, cheese, or milk. Beverages Sweetened drinks, such as soda or iced tea. The items listed above may not be a complete list of foods and beverages you should avoid. Contact a dietitian for more information. Questions to ask a health care provider  Do I need to meet with a diabetes educator?  Do I need to meet with a dietitian?  What number can I call if I have questions?  When are the best times to check my blood glucose? Where to find more information:  American Diabetes Association: diabetes.org  Academy of Nutrition and Dietetics: www.eatright.org  National Institute of Diabetes and Digestive and Kidney Diseases: www.niddk.nih.gov  Association of Diabetes Care and Education Specialists: www.diabeteseducator.org Summary  It is important to have healthy eating   habits because your blood sugar (glucose) levels are greatly affected by what you eat and drink.  A healthy meal plan will help you control your blood glucose and maintain a healthy lifestyle.  Your health care provider may recommend that you work with a dietitian to make a meal plan that is best for you.  Keep in mind that carbohydrates (carbs) and alcohol have immediate effects on your blood glucose levels. It is important to count carbs and to use alcohol carefully. This information is not intended to replace advice given to you by your health care provider. Make sure you discuss any questions you have with your health care provider. Document Revised: 07/01/2019 Document Reviewed: 07/01/2019 Elsevier Patient Education  2021 [employer]  

## 2021-02-08 NOTE — Assessment & Plan Note (Signed)
BMI 37.01with T2DM, HTN.  Recommended eating smaller high protein, low fat meals more frequently and exercising 30 mins a day 5 times a week with a goal of 10-15lb weight loss in the next 3 months. Patient voiced their understanding and motivation to adhere to these recommendations.

## 2021-02-08 NOTE — Assessment & Plan Note (Signed)
Chronic, stable.  Continue current inhaler regimen and adjust as needed, recommend he use inhaler daily vs as needed.  Unable to attain spirometry, he is unable to follow instructions for use.  Return in 3 months.

## 2021-02-08 NOTE — Patient Instructions (Signed)
Visit Information   Goals Addressed               This Visit's Progress     Patient Stated     SW-"I need more help." (pt-stated)   On track     Patient Goals/Self-Care Activities: Over the next 120 days Attend all scheduled appointments with providers Contact office with any questions or concerns [facility] transportation number for upcoming Vascular appt - learn and use visualization or guided imagery - practice relaxation or meditation daily - practice positive thinking and self-talk I will coordinate with your doctor to assist with obtaining a new in-home aid       Other     SW-Track and Manage My Symptoms-Depression   On track     Timeframe:  Long-Range Goal Priority:  High Start Date:   08/09/20                         Expected End Date: 04/06/21                    Follow Up Date- 30 days from 01/21/21  Patient Goals/Self-Care Activities: Over the next 120 days Attend all scheduled appointments with providers Contact office with any questions or concerns [facility] transportation number for upcoming Vascular appt - learn and use visualization or guided imagery - practice relaxation or meditation daily - practice positive thinking and self-talk I will coordinate with your doctor to assist with obtaining a new in-home aid         Patient verbalizes understanding of instructions provided today and agrees to view in [place].   Telephone follow up appointment with care management team member scheduled for:02/21/21  [name], MSW, [email or web address] Phone [phone] 1:05 PM

## 2021-02-08 NOTE — Progress Notes (Signed)
BP 132/78 (BP Location: Left Arm)   Pulse 86   Temp 98.3 F (36.8 C) (Oral)   Wt 222 lb 6.4 oz (100.9 kg)   SpO2 98%   BMI 37.01 kg/m    Subjective:    Patient ID: Evan [name], male    DOB: Jan 22, 1956, 65 y.o.   MRN: [id number]  HPI: Evan [name] is a 65 y.o. male  Chief Complaint  Patient presents with   Diabetes   Hyperlipidemia   Hypertension   Congestive Heart Failure   Mood   Vitamin D    DIABETES Continues on Jardiance 10 MG daily.  A1C in April 6.8%. B12 on 12/03/20 = 370 and Vitamin D 18.5 -- supplements were sent to be placed in pill packs. Hypoglycemic episodes:no Polydipsia/polyuria: no Visual disturbance: no Chest pain: no Paresthesias: no Glucose Monitoring: no             Accucheck frequency: Not Checking             Fasting glucose:             Post prandial:             Evening:             Before meals: Taking Insulin?: no             Long acting insulin:             Short acting insulin: Blood Pressure Monitoring: not checking Retinal Examination: Up to Date Foot Exam: Up to Date Pneumovax: unknown Influenza: unknown Aspirin: yes    HYPERTENSION / HYPERLIPIDEMIA Last saw Dr. [name] on 01/17/21.  Recent EF 11/06/20 was 50 to 55%.  Saw HF Clinic on 03/31/2020, no changes made.  Uses CPAP at home about 100% of the time, but reports it is getting old.  No recent NTG use.  Currently taking ASA, Atorvastatin, Zetia, Lasix, Metoprolol, Olmesartan. Satisfied with current treatment? yes Duration of hypertension: chronic BP monitoring frequency: not checking BP range:  BP medication side effects: no Duration of hyperlipidemia: chronic Cholesterol medication side effects: no Cholesterol supplements: none Medication compliance: good compliance Aspirin: yes Recent stressors: no Recurrent headaches: no Visual changes: no Palpitations: no Dyspnea: no Chest pain: no Lower extremity edema: no Dizzy/lightheaded: no   COPD Ordered Symbicort --  does not use this all the time, only as needed he reports.  Never a smoker. COPD status: stable Satisfied with current treatment?: yes Oxygen use: no Dyspnea frequency: occasional at baseline Cough frequency: occasional at baseline Rescue inhaler frequency:  rarely Limitation of activity: no Productive cough: none Last Spirometry: unknown Pneumovax: given today Influenza: Up to Date   ANXIETY/STRESS Continues on Risperdal BID for mood, which he reports is helpful.  When [facility] there was concern he did not take medication consistently and about his delusional behaviors, he does not want to return to [facility] as he did not like they did not "believe I knew these people and called me delusional".  Reports this hurt his feelings.     He continues to refuse any referral to psychiatry or return to [facility] and continues to discuss his friendships with multiple different famous people -- Indio Santilli, Thayer Headings, Kathleen Lime -- showing different pictures to provider today.   States he was recently in jail for 3 hours because his neighbors got him arrested reporting he was selling drugs, which has never done.   Duration:stable Anxious mood: no  Excessive worrying: no Irritability: no  Sweating: no Nausea: no Palpitations:no Hyperventilation: no Panic attacks: no Agoraphobia: no  Obscessions/compulsions: yes Depressed mood: no Depression screen [facility] 2/9 02/08/2021 12/03/2020 10/06/2020 07/08/2020 04/08/2020  Decreased Interest 0 0 0 0 0  Down, Depressed, Hopeless 1 0 0 0 0  PHQ - 2 Score 1 0 0 0 0  Altered sleeping 0 0 2 0 0  Tired, decreased energy 1 0 0 0 0  Change in appetite 0 0 0 0 0  Feeling bad or failure about yourself  0 0 0 0 0  Trouble concentrating 0 0 0 0 0  Moving slowly or fidgety/restless 0 0 0 0 0  Suicidal thoughts 0 0 0 0 0  PHQ-9 Score 2 0 2 0 0  Difficult doing work/chores Not difficult at all Not difficult at all - Not difficult at all Not  difficult at all  Some recent data might be hidden   GAD 7 : Generalized Anxiety Score 02/08/2021 04/08/2020 03/31/2020 12/10/2019  Nervous, Anxious, on Edge 1 0 0 1  Control/stop worrying 0 1 0 1  Worry too much - different things 0 1 0 1  Trouble relaxing 0 0 0 0  Restless 0 0 0 0  Easily annoyed or irritable 1 0 0 1  Afraid - awful might happen 0 0 0 0  Total GAD 7 Score 2 2 0 4  Anxiety Difficulty Not difficult at all Not difficult at all Not difficult at all Somewhat difficult     Relevant past medical, surgical, family and social history reviewed and updated as indicated. Interim medical history since our last visit reviewed. Allergies and medications reviewed and updated.  Review of Systems  Constitutional:  Negative for activity change, diaphoresis, fatigue and fever.  Respiratory:  Negative for cough, chest tightness, shortness of breath and wheezing.   Cardiovascular:  Positive for leg swelling (baseline per his report). Negative for chest pain and palpitations.  Gastrointestinal: Negative.   Endocrine: Negative for cold intolerance, heat intolerance, polydipsia, polyphagia and polyuria.  Neurological: Negative.   Psychiatric/Behavioral:  Negative for decreased concentration, self-injury, sleep disturbance and suicidal ideas. The patient is not nervous/anxious.    Per HPI unless specifically indicated above     Objective:    BP 132/78 (BP Location: Left Arm)   Pulse 86   Temp 98.3 F (36.8 C) (Oral)   Wt 222 lb 6.4 oz (100.9 kg)   SpO2 98%   BMI 37.01 kg/m   Wt Readings from Last 3 Encounters:  02/08/21 222 lb 6.4 oz (100.9 kg)  01/17/21 223 lb (101.2 kg)  01/15/21 227 lb 1.2 oz (103 kg)    Physical Exam Vitals and nursing note reviewed.  Constitutional:      General: He is awake. He is not in acute distress.    Appearance: He is well-developed. He is morbidly obese. He is not ill-appearing.  HENT:     Head: Normocephalic and atraumatic.     Right Ear: Hearing  normal. No drainage.     Left Ear: Hearing normal. No drainage.  Eyes:     General: Lids are normal.        Right eye: No discharge.        Left eye: No discharge.     Pupils: Pupils are equal, round, and reactive to light.  Neck:     Thyroid: No thyromegaly.     Vascular: No carotid bruit or JVD.     Trachea: Trachea  normal.  Cardiovascular:     Rate and Rhythm: Normal rate and regular rhythm.     Pulses:          Dorsalis pedis pulses are 1+ on the right side and 1+ on the left side.       Posterior tibial pulses are 1+ on the right side and 1+ on the left side.     Heart sounds: Normal heart sounds, S1 normal and S2 normal. No murmur heard.   No gallop.     Comments: Varicose veins bilateral legs with dependent rubor bilaterally R>L.   Pulmonary:     Effort: Pulmonary effort is normal. No accessory muscle usage or respiratory distress.     Breath sounds: Normal breath sounds.  Abdominal:     General: Bowel sounds are normal.     Palpations: Abdomen is soft.     Tenderness: There is no abdominal tenderness.  Musculoskeletal:        General: Normal range of motion.     Cervical back: Normal range of motion and neck supple.     Right lower leg: Edema (trace) present.     Left lower leg: Edema (trace) present.  Lymphadenopathy:     Cervical: No cervical adenopathy.  Skin:    General: Skin is warm and dry.     Capillary Refill: Capillary refill takes less than 2 seconds.  Neurological:     Mental Status: He is alert and oriented to person, place, and time.     Gait: Gait is intact.  Psychiatric:        Attention and Perception: Attention normal.        Mood and Affect: Mood normal.        Speech: Speech normal.        Behavior: Behavior normal. Behavior is cooperative.     Comments: Tangential thought processes per baseline.  Talked about his friendship with [name] and showed PCP pictures of him on Instagram.  This is baseline for him.   Results for orders placed  or performed in visit on 02/04/21  PSA  Result Value Ref Range   Prostatic Specific Antigen 0.02 0.00 - 4.00 ng/mL      Assessment & Plan:   Problem List Items Addressed This Visit       Cardiovascular and Mediastinum   Hypertension associated with diabetes ([place])    Chronic, ongoing.  BP initially above goal and recheck at goal -- he was anxious and irritated to initially.  Continue Olmesartan 10 MG daily, is tolerating, and continue Metoprolol + Lasix.  Recommend checking BP at home a few mornings a week at home and documenting + focus on DASH diet.  Continue collaboration with cardiology.  Return in 3 months.  CMP today.       Relevant Orders   Bayer DCA Hb A1c Waived   Comprehensive metabolic panel   Chronic diastolic CHF (congestive heart failure) (HCC)    Chronic, ongoing. Euvolemic today.  Continue collaboration with cardiology and HF clinic. Recommend he take medication consistently. Recommend: - Reminded to call for an overnight weight gain of >2 pounds or a weekly weight weight of >5 pounds - not adding salt to his food and has been reading food labels. Reviewed the importance of keeping daily sodium intake to 2000mg  daily  - Return in 2 months for visit and labs.       Atherosclerosis of aorta ([place])    Noted on CT 10/29/17.  Continue Crestor and  Xarelto which offer preventative.  Recommend he consistently take his medication daily, currently using pill packs.         Respiratory   Obstructive sleep apnea on CPAP    Chronic, ongoing.  Recommend continue 100% use of CPAP.       COPD, mild (HCC)    Chronic, stable.  Continue current inhaler regimen and adjust as needed, recommend he use inhaler daily vs as needed.  Unable to attain spirometry, he is unable to follow instructions for use.  Return in 3 months.         Endocrine   Type 2 diabetes mellitus with morbid obesity ([place]) - Primary    Chronic, ongoing with A1C today 6.3% and urine ALB 30 and A:C 30-300  February 2022.  Continue ARB for kidney protection.  Avoid ACE due to COPD.  Continue current Jardiance, benefit with his HF.  A1c and CMP today.  Recommend he check BS at least 3 mornings a week at home.  Return to office in 3 months.  Continue pill packs for consistent medication adherence.       Relevant Orders   Bayer DCA Hb A1c Waived   Comprehensive metabolic panel   Hyperlipidemia associated with type 2 diabetes mellitus (HCC)    Chronic, ongoing.  Continue current medication regimen and adjust as needed.  Return in 3 months for visit.  Lipid panel todayt, statin increased recently and LDL last visit in 120 range.       Relevant Orders   Bayer DCA Hb A1c Waived   Lipid Panel w/o Chol/HDL Ratio     Other   Obesity, morbid (more than 100 lbs over ideal weight or BMI > 40) (HCC)    BMI 37.01with T2DM, HTN.  Recommended eating smaller high protein, low fat meals more frequently and exercising 30 mins a day 5 times a week with a goal of 10-15lb weight loss in the next 3 months. Patient voiced their understanding and motivation to adhere to these recommendations.        Anxiety    Chronic, ongoing with some grandiose thought processes present, ?more schizophrenia on presentation.  Continue current medication regimen and adjust as needed.  Has history on review old records of missing doses Risperdal, will continue current medication at this time and adjust as needed -- pill packs have been beneficial for patient.  Continue collaboration with CCM SW.  Would benefit from psychiatry and therapy, but he refuses these.  Will continue to encourage this and discuss with him.       Chronic venous stasis    Referral to vascular placed recent visit, will reach out to referral team to get him scheduled.       History of CVA (cerebrovascular accident)    Acute 9 mm right thalamic infarct on 11/06/2020.  At this time continue current medication regimen and will adjust as needed.  Discussed with  him stroke prevention goals.  Referral to neurology & vascular for further assessment placed last visit, has not attended -- will check on these.  He declines PT/OT in home.       Vitamin D deficiency    Chronic, ongoing.  Continue supplement and recheck level today.       Relevant Orders   VITAMIN D 25 Hydroxy (Vit-D Deficiency, Fractures)   Vitamin B12 deficiency    Noted recent labs and supplement sent in for pill packs, continue this and recheck level today.       Relevant  Orders   Vitamin B12    Time: 30 minutes, >50% spent counseling/or care coordination   Follow up plan: Return in about 3 months (around 05/11/2021) for T2DM, HTN/HLD, HF, COPD, MOOD, PVD, GERD.

## 2021-02-08 NOTE — Assessment & Plan Note (Signed)
Chronic, ongoing.  BP initially above goal and recheck at goal -- he was anxious and irritated to initially.  Continue Olmesartan 10 MG daily, is tolerating, and continue Metoprolol + Lasix.  Recommend checking BP at home a few mornings a week at home and documenting + focus on DASH diet.  Continue collaboration with cardiology.  Return in 3 months.  CMP today.

## 2021-02-08 NOTE — Assessment & Plan Note (Signed)
Acute 9 mm right thalamic infarct on 11/06/2020.  At this time continue current medication regimen and will adjust as needed.  Discussed with him stroke prevention goals.  Referral to neurology & vascular for further assessment placed last visit, has not attended -- will check on these.  He declines PT/OT in home.

## 2021-02-08 NOTE — Assessment & Plan Note (Signed)
Chronic, ongoing with some grandiose thought processes present, ?more schizophrenia on presentation.  Continue current medication regimen and adjust as needed.  Has history on review old records of missing doses Risperdal, will continue current medication at this time and adjust as needed -- pill packs have been beneficial for patient.  Continue collaboration with CCM SW.  Would benefit from psychiatry and therapy, but he refuses these.  Will continue to encourage this and discuss with him.

## 2021-02-09 ENCOUNTER — Telehealth: Payer: Self-pay | Admitting: *Deleted

## 2021-02-09 LAB — VITAMIN D 25 HYDROXY (VIT D DEFICIENCY, FRACTURES): Vit D, 25-Hydroxy: 23.4 ng/mL — ABNORMAL LOW (ref 30.0–100.0)

## 2021-02-09 LAB — COMPREHENSIVE METABOLIC PANEL
ALT: 17 IU/L (ref 0–44)
AST: 13 IU/L (ref 0–40)
Albumin/Globulin Ratio: 1.8 (ref 1.2–2.2)
Albumin: 4.6 g/dL (ref 3.8–4.8)
Alkaline Phosphatase: 98 IU/L (ref 44–121)
BUN/Creatinine Ratio: 21 (ref 10–24)
BUN: 23 mg/dL (ref 8–27)
Bilirubin Total: 0.5 mg/dL (ref 0.0–1.2)
CO2: 21 mmol/L (ref 20–29)
Calcium: 10 mg/dL (ref 8.6–10.2)
Chloride: 104 mmol/L (ref 96–106)
Creatinine, Ser: 1.08 mg/dL (ref 0.76–1.27)
Globulin, Total: 2.6 g/dL (ref 1.5–4.5)
Glucose: 119 mg/dL — ABNORMAL HIGH (ref 65–99)
Potassium: 4.2 mmol/L (ref 3.5–5.2)
Sodium: 142 mmol/L (ref 134–144)
Total Protein: 7.2 g/dL (ref 6.0–8.5)
eGFR: 76 mL/min/{1.73_m2} (ref >59)

## 2021-02-09 LAB — LIPID PANEL W/O CHOL/HDL RATIO
Cholesterol, Total: 280 mg/dL — ABNORMAL HIGH (ref 100–199)
HDL: 55 mg/dL (ref >39)
LDL Chol Calc (NIH): 202 mg/dL — ABNORMAL HIGH (ref 0–99)
Triglycerides: 128 mg/dL (ref 0–149)
VLDL Cholesterol Cal: 23 mg/dL (ref 5–40)

## 2021-02-09 LAB — VITAMIN B12: Vitamin B-12: 367 pg/mL (ref 232–1245)

## 2021-02-09 NOTE — Progress Notes (Signed)
Good morning, not sure Evan [name] checks his MyChart so please let him know message below and also please call pharmacy and check to ensure B12, Vitamin D, Zetia, and Atorvastatin are in his pill packs.  Thanks. ---   Message for Evan [name]: Rolly Salter, your labs have returned.  Kidney and liver function remain stable.  Cholesterol level remain elevated, please ensure you are taking all medication from pill packs daily, including any night time medications -- this is very important as at this time your cholesterol levels put you at high risk for another stroke and you are on two medications to lower these so we need to ensure they are working.  Vitamin D and B12 levels remain low too, they were to add these to your pill packs too and I will check on this.  Again, please ensure to take all medications and next visit I would like you to bring your pill packs so I can check on these.  Any questions?  Have a great day!! Keep being awesome!!  Thank you for allowing me to participate in your care.  I appreciate you. Kindest regards, Kael Keetch

## 2021-02-09 NOTE — Telephone Encounter (Signed)
   Telephone encounter was:  Unsuccessful.  02/09/2021 Name: Evan [name] MRN: [id number] DOB: March 19, 1956  Unsuccessful outbound call made today to assist with:  Food Insecurity  Outreach Attempt:  1st Attempt  A HIPAA compliant voice message was left requesting a return call.  Instructed patient to call back at   Instructed patient to call back at [phone]  at their earliest convenience.   [place], Care Management  [phone] [address] , [place] [address] Email : [email or web address]

## 2021-02-11 ENCOUNTER — Telehealth: Payer: Self-pay | Admitting: *Deleted

## 2021-02-11 ENCOUNTER — Ambulatory Visit
Admission: RE | Admit: 2021-02-11 | Discharge: 2021-02-11 | Disposition: A | Discharge status: Home / Self Care | Payer: Medicare Other | Source: Ambulatory Visit | Attending: Radiation Oncology | Admitting: Radiation Oncology

## 2021-02-11 ENCOUNTER — Encounter: Payer: Self-pay | Admitting: Radiation Oncology

## 2021-02-11 ENCOUNTER — Other Ambulatory Visit: Payer: Self-pay

## 2021-02-11 ENCOUNTER — Emergency Department: Payer: Medicare Other

## 2021-02-11 ENCOUNTER — Emergency Department
Admission: EM | Admit: 2021-02-11 | Discharge: 2021-02-11 | Disposition: A | Discharge status: Home / Self Care | Payer: Medicare Other | Attending: Emergency Medicine | Admitting: Emergency Medicine

## 2021-02-11 ENCOUNTER — Other Ambulatory Visit: Payer: Self-pay | Admitting: *Deleted

## 2021-02-11 ENCOUNTER — Encounter: Payer: Self-pay | Admitting: Emergency Medicine

## 2021-02-11 VITALS — Temp 96.8°F | Resp 20 | Wt 222.2 lb

## 2021-02-11 DIAGNOSIS — R002 Palpitations: Secondary | ICD-10-CM | POA: Diagnosis not present

## 2021-02-11 DIAGNOSIS — I110 Hypertensive heart disease with heart failure: Secondary | ICD-10-CM | POA: Insufficient documentation

## 2021-02-11 DIAGNOSIS — Z743 Need for continuous supervision: Secondary | ICD-10-CM | POA: Diagnosis not present

## 2021-02-11 DIAGNOSIS — I499 Cardiac arrhythmia, unspecified: Secondary | ICD-10-CM | POA: Diagnosis not present

## 2021-02-11 DIAGNOSIS — R6889 Other general symptoms and signs: Secondary | ICD-10-CM | POA: Diagnosis not present

## 2021-02-11 DIAGNOSIS — I2510 Atherosclerotic heart disease of native coronary artery without angina pectoris: Secondary | ICD-10-CM | POA: Insufficient documentation

## 2021-02-11 DIAGNOSIS — Z923 Personal history of irradiation: Secondary | ICD-10-CM | POA: Insufficient documentation

## 2021-02-11 DIAGNOSIS — C61 Malignant neoplasm of prostate: Secondary | ICD-10-CM | POA: Insufficient documentation

## 2021-02-11 DIAGNOSIS — Z951 Presence of aortocoronary bypass graft: Secondary | ICD-10-CM | POA: Diagnosis not present

## 2021-02-11 DIAGNOSIS — I517 Cardiomegaly: Secondary | ICD-10-CM | POA: Diagnosis not present

## 2021-02-11 DIAGNOSIS — Z08 Encounter for follow-up examination after completed treatment for malignant neoplasm: Secondary | ICD-10-CM | POA: Diagnosis not present

## 2021-02-11 DIAGNOSIS — J45909 Unspecified asthma, uncomplicated: Secondary | ICD-10-CM | POA: Insufficient documentation

## 2021-02-11 DIAGNOSIS — Z8616 Personal history of COVID-19: Secondary | ICD-10-CM | POA: Insufficient documentation

## 2021-02-11 DIAGNOSIS — J449 Chronic obstructive pulmonary disease, unspecified: Secondary | ICD-10-CM | POA: Diagnosis not present

## 2021-02-11 DIAGNOSIS — Z79899 Other long term (current) drug therapy: Secondary | ICD-10-CM | POA: Diagnosis not present

## 2021-02-11 DIAGNOSIS — Z7982 Long term (current) use of aspirin: Secondary | ICD-10-CM | POA: Diagnosis not present

## 2021-02-11 DIAGNOSIS — I509 Heart failure, unspecified: Secondary | ICD-10-CM | POA: Diagnosis not present

## 2021-02-11 DIAGNOSIS — R42 Dizziness and giddiness: Secondary | ICD-10-CM | POA: Diagnosis not present

## 2021-02-11 DIAGNOSIS — R1110 Vomiting, unspecified: Secondary | ICD-10-CM | POA: Diagnosis not present

## 2021-02-11 DIAGNOSIS — R404 Transient alteration of awareness: Secondary | ICD-10-CM | POA: Diagnosis not present

## 2021-02-11 LAB — CBC
HCT: 44.9 % (ref 39.0–52.0)
Hemoglobin: 14.3 g/dL (ref 13.0–17.0)
MCH: 28 pg (ref 26.0–34.0)
MCHC: 31.8 g/dL (ref 30.0–36.0)
MCV: 88 fL (ref 80.0–100.0)
Platelets: 230 10*3/uL (ref 150–400)
RBC: 5.1 MIL/uL (ref 4.22–5.81)
RDW: 15.7 % — ABNORMAL HIGH (ref 11.5–15.5)
WBC: 10.2 10*3/uL (ref 4.0–10.5)
nRBC: 0 % (ref 0.0–0.2)

## 2021-02-11 LAB — BASIC METABOLIC PANEL
Anion gap: 8 (ref 5–15)
BUN: 15 mg/dL (ref 8–23)
CO2: 23 mmol/L (ref 22–32)
Calcium: 9.2 mg/dL (ref 8.9–10.3)
Chloride: 107 mmol/L (ref 98–111)
Creatinine, Ser: 0.83 mg/dL (ref 0.61–1.24)
GFR, Estimated: >60 mL/min (ref >60)
Glucose, Bld: 147 mg/dL — ABNORMAL HIGH (ref 70–99)
Potassium: 3.9 mmol/L (ref 3.5–5.1)
Sodium: 138 mmol/L (ref 135–145)

## 2021-02-11 IMAGING — CR DG CHEST 2V
1 series · 3 of 3 positions shown · non-contrast
Comparison: [DATE]

CLINICAL DATA: Palpitations, feeling faint and dizzy

EXAM:
CHEST - 2 VIEW

[Series 1: dg chest 2 view · 0.14mm/px · 3 of 3 slices shown]
[im 1/3]
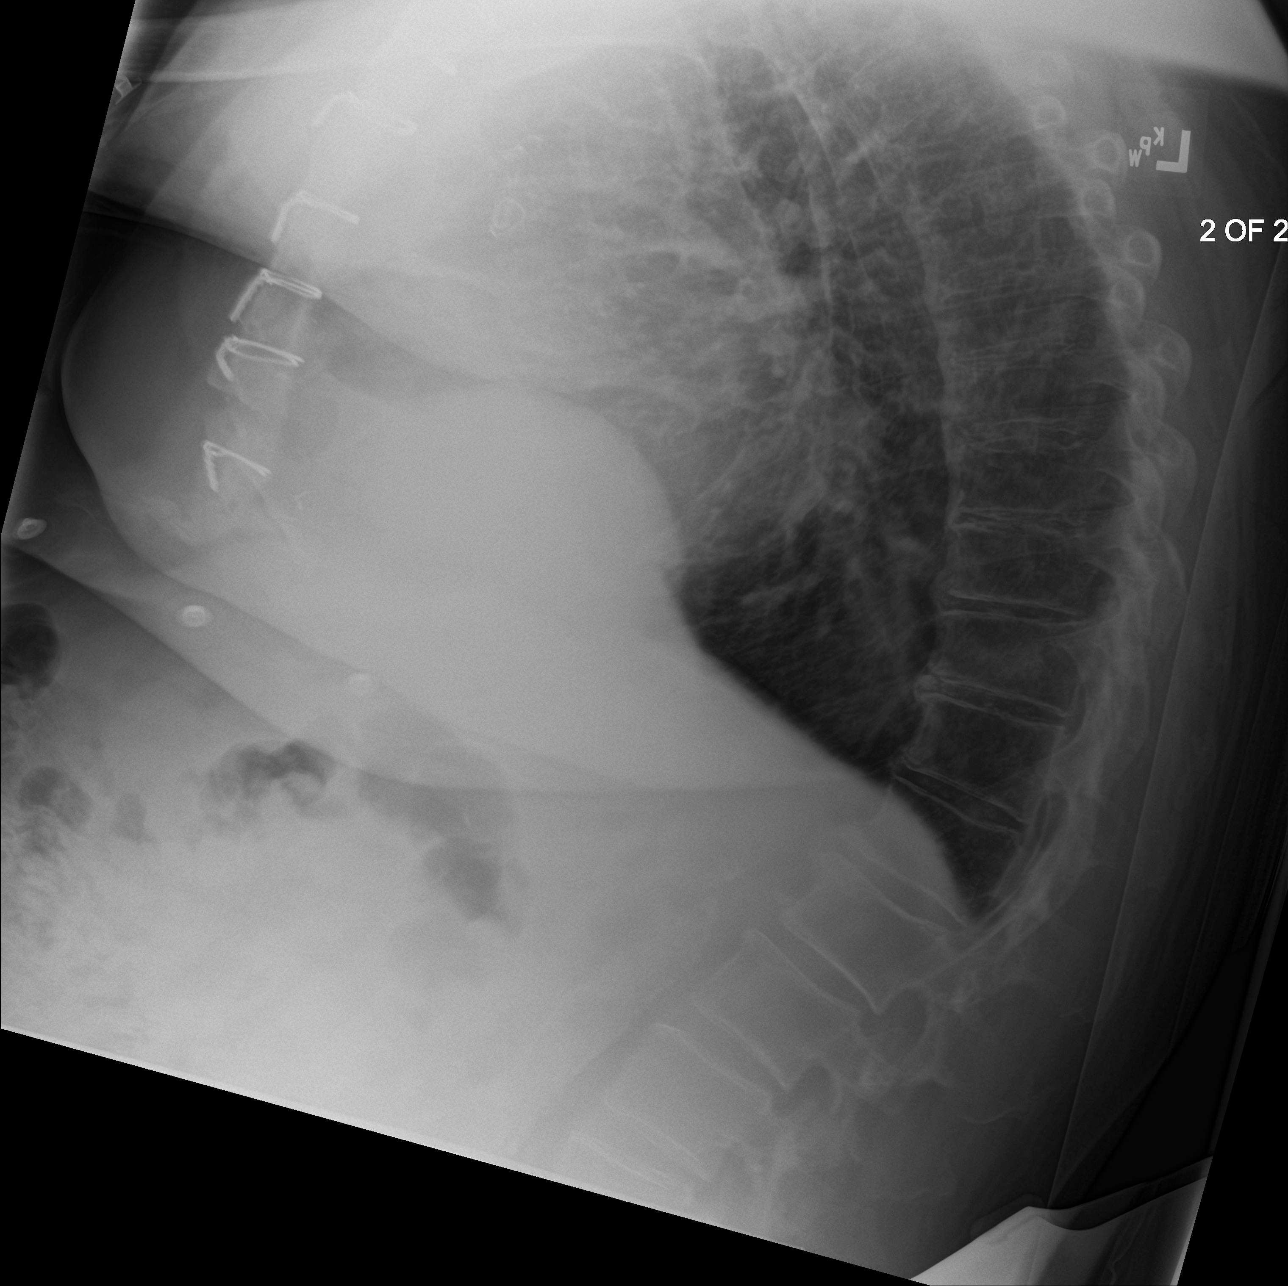
[im 2/3]
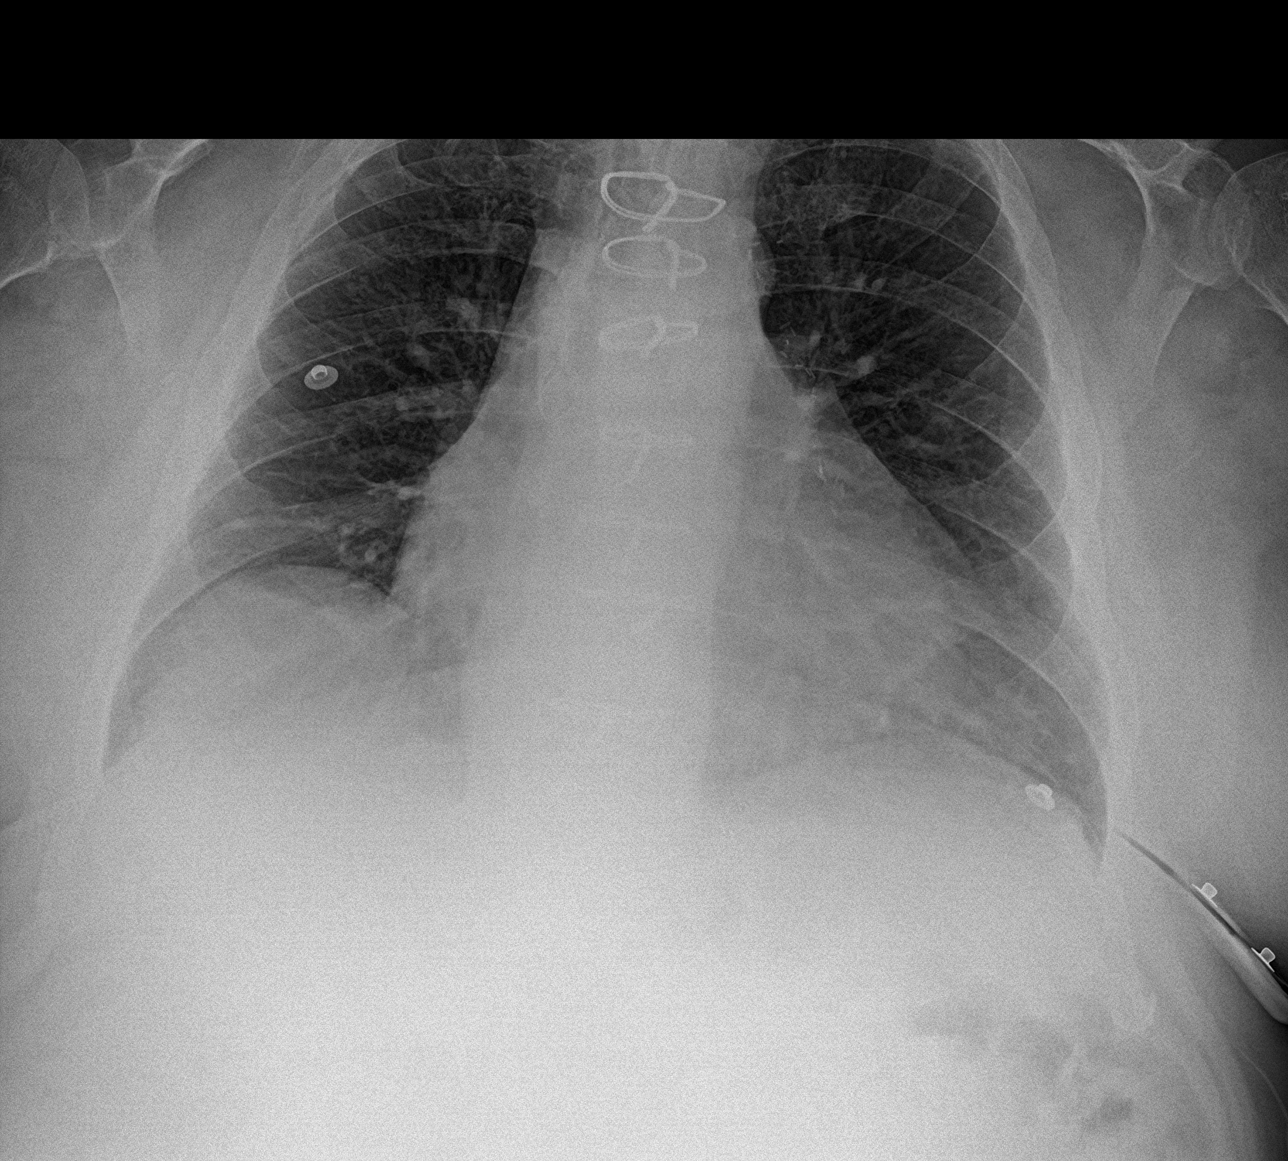
[im 3/3]
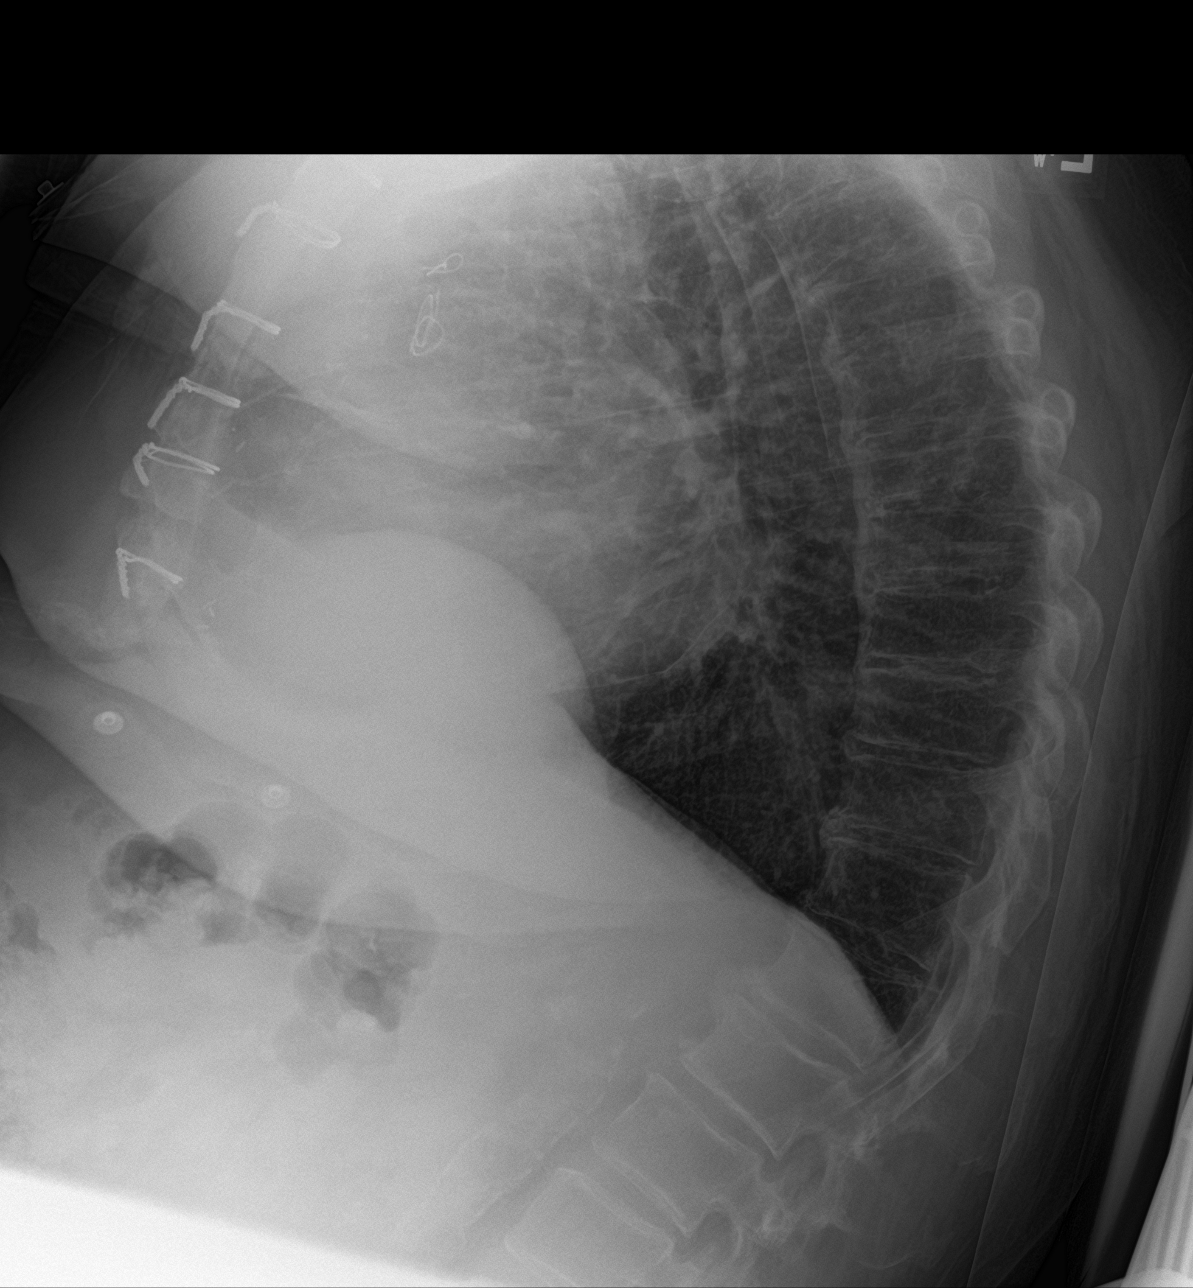

[3 of 3 positions shown; findings below may reference images not displayed]

FINDINGS: Prior median sternotomy and CABG. Unchanged mild cardiomegaly. No
central vascular congestion. Aortic atherosclerosis. No focal
consolidation overt pulmonary edema. No pleural effusion or
pneumothorax. Similar eventration of the right hemidiaphragm.
Thoracic spondylosis with bony bridging along the anterior vertebral
bodies.
IMPRESSION: No acute cardiopulmonary abnormality.

## 2021-02-11 NOTE — ED Triage Notes (Signed)
Pt comes into the ED via EMS from home with c/o feeling faint and dizzy for the past couple of days,  103-112, a-fib 97%RA 185/97

## 2021-02-11 NOTE — ED Triage Notes (Signed)
C/O palpitations x 2 days.  Patient states then his vertigo got "crazy" so he called EMS  AAOx3. Skin warm and dry. NAD

## 2021-02-11 NOTE — Progress Notes (Signed)
Radiation Oncology Follow up Note  Name: Evan [name]   Date:   02/11/2021 MRN:  [id number] DOB: 05/04/1956    This 65 y.o. male presents to the clinic today for 3-year follow-up status post IMRT radiation therapy to his prostate and pelvic nodes for Gleason 8 (4+4) adenocarcinoma presenting with a PSA of 30.  REFERRING PROVIDER: [name], NP  HPI: Patient is a 65 year old male now out 3 years having completed IMRT radiation therapy for Gleason 8 (4+4) adenocarcinoma presenting with a PSA of 30 seen today in routine follow-up he has a low side effect profile he specifically denies any increased lower urinary tract symptoms diarrhea or fatigue.  He has had some strokes in the past year for which he is recovering..  Patient has not not on ADT therapy.  He does have documented nonhemorrhagic infarct of the right thalamus.  He had a l MRI of his lumbar spine showing no evidence of metastatic disease which I have reviewed.  COMPLICATIONS OF TREATMENT: none  FOLLOW UP COMPLIANCE: keeps appointments   PHYSICAL EXAM:  Temp (!) 96.8 F (36 C) (Tympanic)   Resp 20   Wt 222 lb 3.2 oz (100.8 kg)   BMI 36.98 kg/m  Obese male in NAD.  Well-developed well-nourished patient in NAD. HEENT reveals PERLA, EOMI, discs not visualized.  Oral cavity is clear. No oral mucosal lesions are identified. Neck is clear without evidence of cervical or supraclavicular adenopathy. Lungs are clear to A&P. Cardiac examination is essentially unremarkable with regular rate and rhythm without murmur rub or thrill. Abdomen is benign with no organomegaly or masses noted. Motor sensory and DTR levels are equal and symmetric in the upper and lower extremities. Cranial nerves II through XII are grossly intact. Proprioception is intact. No peripheral adenopathy or edema is identified. No motor or sensory levels are noted. Crude visual fields are within normal range. RADIOLOGY RESULTS: CT scans of head as well as MRI of brain  and lumbar spine all reviewed compatible with above-stated findings  PLAN: Present time patient is under excellent biochemical control of his prostate cancer.  He has multiple other medical comorbidities for which he is receiving therapy.  I have asked to see him back in 1 year for follow-up.  We will repeat PSA at that time.  Patient knows to call with any concerns.  I would like to take this opportunity to thank you for allowing me to participate in the care of your patient.Noreene Filbert, MD

## 2021-02-11 NOTE — ED Provider Notes (Signed)
[facility] Emergency Department Provider Note   ____________________________________________   Event Date/Time   First MD Initiated Contact with Patient 02/11/21 1752     (approximate)  I have reviewed the triage vital signs and the nursing notes.   HISTORY  Chief Complaint Palpitations    HPI Evan [name] is a 65 y.o. male with past medical history of hypertension, hyperlipidemia, CAD status post CABG, diabetes, CHF, COPD, stroke, and permanent atrial fibrillation on Xarelto who presents to the ED complaining of palpitations.  Patient states that he got in an argument with his landlord earlier today about whether he had paid rent.  He states this "stressed me out" and caused him to have "vertigo" and palpitations.  He states he felt dizzy and lightheaded along with sensation of his heart racing, now states the symptoms have resolved and he is feeling better.  He denies any associated chest pain or shortness of breath.  He is otherwise been feeling well with no fevers, cough, nausea, or vomiting.  He states he has been compliant with medications for his atrial fibrillation.        Past Medical History:  Diagnosis Date   Anginal pain ([place])    Anxiety disorder    Asthma    Atresia of esophagus without fistula    CAD (coronary artery disease)    Cellulitis    CHF (congestive heart failure) (HCC)    NYHA CLASS III,CHRONIC,DIASTOLIC   COPD (chronic obstructive pulmonary disease) ([place])    COVID-19    Diabetes mellitus without complication (HCC)    Edema    RIGHT LOWER LEG   Gastroesophageal reflux    H/O: GI bleed    History of pneumonia    Remote   History of scarlet fever    Childhood   Hyperlipidemia    Hypertension    Myocardial infarction ([place]) 2009   Obesity    Obstructive sleep apnea    Pain    CHRONIC BACK / ABDOMINAL   Panic disorder    Peripheral venous insufficiency    PTSD (post-traumatic stress disorder)    Retinopathy     DIABETIC   Stasis, venous    Stroke [facility])    Vertigo     Patient Active Problem List   Diagnosis Date Noted   Other thrombophilia ([place]) 12/25/2020   Vitamin D deficiency 12/04/2020   Vitamin B12 deficiency 12/04/2020   Stenosis of right carotid artery 12/03/2020   History of CVA (cerebrovascular accident) 11/06/2020   History of 2019 novel coronavirus disease (COVID-19) 09/27/2020   Atherosclerosis of aorta ([place]) 12/04/2019   Persistent atrial fibrillation ([place]) 09/12/2019   CAD (coronary artery disease) 09/12/2019   Chronic venous stasis 09/12/2019   Hoarding disorder 09/12/2019   Cervical spinal stenosis 09/12/2019   Diabetic retinopathy ([place]) 09/12/2019   COPD, mild ([place]) 09/12/2019   Osteoporosis 09/09/2019   History of prostate cancer 09/09/2019   Anxiety 08/10/2019   Obstructive sleep apnea on CPAP 08/10/2019   Hyperlipidemia associated with type 2 diabetes mellitus ([place]) 08/10/2019   Chronic diastolic CHF (congestive heart failure) ([place]) 01/09/2014   Esophageal dysmotility 09/12/2013   Type 2 diabetes mellitus with morbid obesity ([place]) 02/14/2012   Obesity, morbid (more than 100 lbs over ideal weight or BMI > 40) ([place]) 07/26/2011   OLD MYOCARDIAL INFARCTION 03/02/2010   Hypertension associated with diabetes ([place]) 03/20/2009   CORONARY ATHEROSCLEROSIS, ARTERY BYPASS GRAFT 03/20/2009    Past Surgical History:  Procedure Laterality Date  CARDIAC CATHETERIZATION     CATARACT EXTRACTION Left    CATARACT EXTRACTION W/PHACO Right 05/03/2017   Procedure: CATARACT EXTRACTION PHACO AND INTRAOCULAR LENS PLACEMENT ([place]);  Surgeon: Leandrew Koyanagi, MD;  Location: [facility] ORS;  Service: Ophthalmology;  Laterality: Right;  [place] 00:35.3 AP% 12.3 CDE 4.33 Fluid Pack lot # [id number]        COLONOSCOPY WITH PROPOFOL N/A 11/26/2020   Procedure: COLONOSCOPY WITH PROPOFOL;  Surgeon: Jonathon Bellows, MD;  Location: [facility] ENDOSCOPY;  Service: Gastroenterology;  Laterality: N/A;  ANNETTE TO  PICK UP [phone]   CORONARY ANGIOPLASTY WITH STENT PLACEMENT  2002   CORONARY ANGIOPLASTY WITH STENT PLACEMENT  1999   CORONARY ARTERY BYPASS GRAFT     x7   ESOPHAGOGASTRODUODENOSCOPY N/A 09/19/2016   Procedure: ESOPHAGOGASTRODUODENOSCOPY (EGD);  Surgeon: Lollie Sails, MD;  Location: [facility] ENDOSCOPY;  Service: Endoscopy;  Laterality: N/A;   ESOPHAGOGASTRODUODENOSCOPY (EGD) WITH PROPOFOL  11/26/2020   Procedure: ESOPHAGOGASTRODUODENOSCOPY (EGD) WITH PROPOFOL;  Surgeon: Jonathon Bellows, MD;  Location: [facility] ENDOSCOPY;  Service: Gastroenterology;;    Prior to Admission medications   Medication Sig Start Date End Date Taking? Authorizing Provider  acetaminophen (TYLENOL) 500 MG tablet Take 500 mg by mouth every 6 (six) hours as needed.    [provider]  aspirin 81 MG chewable tablet Chew 1 tablet (81 mg total) by mouth daily. 11/23/20   [name], Henrine Screws T, NP  atorvastatin (LIPITOR) 80 MG tablet Take 1 tablet (80 mg total) by mouth daily. 11/26/20 02/19/22  [name], Henrine Screws T, NP  Blood Glucose Monitoring Suppl (ONETOUCH VERIO) w/Device KIT Use to check blood sugar 3 to 4 times a day and document.  Please bring to visits for review. 07/08/20   [name], Henrine Screws T, NP  budesonide-formoterol (SYMBICORT) 80-4.5 MCG/ACT inhaler Inhale 2 puffs into the lungs 2 (two) times daily. 09/24/20   [name] T, NP  Cholecalciferol 125 MCG (5000 UT) TABS Take 1 tablet (5,000 Units total) by mouth daily. 12/04/20   [name], NP  Elastic Bandages & Supports (MEDICAL COMPRESSION STOCKINGS) MISC 1 Package by Does not apply route daily. 08/28/20   Sable Feil, PA-C  empagliflozin (JARDIANCE) 10 MG TABS tablet Take 1 tablet (10 mg total) by mouth daily. 07/08/20   [name], Henrine Screws T, NP  ezetimibe (ZETIA) 10 MG tablet Take 1 tablet (10 mg total) by mouth daily. 10/07/20   [name], Henrine Screws T, NP  furosemide (LASIX) 40 MG tablet TAKE 2 TABLETS BY MOUTH ONCE EVERY MORNING AND 1 TABLET ONCE EVERY EVENING  01/20/21   [name], [name] T, NP  glucose blood (ONETOUCH VERIO) test strip Use to check blood sugar 3 to 4 times a day. 07/08/20   [name], Henrine Screws T, NP  metoprolol succinate (TOPROL XL) 50 MG 24 hr tablet Take 1 tablet (50 mg total) by mouth daily. 07/08/20   [name], NP  MODERNA COVID-19 VACCINE 100 MCG/0.5ML injection  08/28/20   [provider]  nitroGLYCERIN (NITROSTAT) 0.4 MG SL tablet Place 1 tablet (0.4 mg total) under the tongue every 5 (five) minutes as needed for chest pain. 10/10/19   [name], Henrine Screws T, NP  olmesartan (BENICAR) 5 MG tablet Take 2 tablets (10 mg total) by mouth daily. 12/03/20   [name], Henrine Screws T, NP  Omega-3 Fatty Acids (OMEGA 3 500 PO) Take 500 mg by mouth daily.    [provider]  OneTouch Delica Lancets 79G MISC Use to check blood sugar 2-3 times a day. 09/29/20   [name]  T, NP  pantoprazole (PROTONIX) 40 MG tablet Take 1 tablet (40 mg total) by mouth daily. 04/08/20   [name], Henrine Screws T, NP  risperiDONE (RISPERDAL) 0.5 MG tablet Take 1 tablet (0.5 mg total) by mouth 2 (two) times daily. 07/08/20   [name], Henrine Screws T, NP  rivaroxaban (XARELTO) 20 MG TABS tablet Take 1 tablet (20 mg total) by mouth at bedtime. 07/08/20   [name] T, NP  sharps container 1 each by Does not apply route as needed. 09/29/20   [name], NP  Skin Protectants, Misc. (EUCERIN) cream Apply topically as needed for dry skin. 08/28/20   Sable Feil, PA-C  triamcinolone cream (KENALOG) 0.1 % APPLY TO AFFECTED AREA(s) TWICE DAILY 12/29/20   [name], Henrine Screws T, NP  vitamin B-12 (CYANOCOBALAMIN) 1000 MCG tablet Take 1 tablet (1,000 mcg total) by mouth daily. 12/04/20   [name] T, NP    Allergies Penicillin g, Sulfa antibiotics, Tiotropium, and Zoloft [sertraline hcl]  Family History  Problem Relation Age of Onset   Heart attack Mother    Hypertension Mother    Hyperlipidemia Mother    Heart attack Brother 37       MI   Coronary artery disease  Other     Social History Social History   Tobacco Use   Smoking status: Never   Smokeless tobacco: Never  Vaping Use   Vaping Use: Never used  Substance Use Topics   Alcohol use: No   Drug use: No    Review of Systems  Constitutional: No fever/chills Eyes: No visual changes. ENT: No sore throat. Cardiovascular: Denies chest pain.  Positive for dizziness and palpitations. Respiratory: Denies shortness of breath. Gastrointestinal: No abdominal pain.  No nausea, no vomiting.  No diarrhea.  No constipation. Genitourinary: Negative for dysuria. Musculoskeletal: Negative for back pain. Skin: Negative for rash. Neurological: Negative for headaches, focal weakness or numbness.  ____________________________________________   PHYSICAL EXAM:  VITAL SIGNS: ED Triage Vitals  Enc Vitals Group     BP 02/11/21 1457 (!) 180/105     Pulse Rate 02/11/21 1457 85     Resp 02/11/21 1457 18     Temp 02/11/21 1457 98.6 F (37 C)     Temp Source 02/11/21 1457 Oral     SpO2 02/11/21 1457 94 %     Weight 02/11/21 1455 221 lb 7.9 oz (100.5 kg)     Height 02/11/21 1455 5' 5"  (1.651 m)     Head Circumference --      Peak Flow --      Pain Score 02/11/21 1455 0     Pain Loc --      Pain Edu? --      Excl. in [place]? --     Constitutional: Alert and oriented. Eyes: Conjunctivae are normal. Head: Atraumatic. Nose: No congestion/rhinnorhea. Mouth/Throat: Mucous membranes are moist. Neck: Normal ROM Cardiovascular: Normal rate, irregularly irregular rhythm. Grossly normal heart sounds.  2+ radial pulses bilaterally. Respiratory: Normal respiratory effort.  No retractions. Lungs CTAB. Gastrointestinal: Soft and nontender. No distention. Genitourinary: deferred Musculoskeletal: No lower extremity tenderness nor edema. Neurologic:  Normal speech and language. No gross focal neurologic deficits are appreciated. Skin:  Skin is warm, dry and intact. No rash noted. Psychiatric: Mood and affect  are normal. Speech and behavior are normal.  ____________________________________________   LABS (all labs ordered are listed, but only abnormal results are displayed)  Labs Reviewed  BASIC METABOLIC PANEL - Abnormal; Notable for the following components:  Result Value   Glucose, Bld 147 (*)    All other components within normal limits  CBC - Abnormal; Notable for the following components:   RDW 15.7 (*)    All other components within normal limits   ____________________________________________  EKG  ED ECG REPORT I, Blake Divine, the attending physician, personally viewed and interpreted this ECG.   Date: 02/11/2021  EKG Time: 14:59  Rate: 90  Rhythm: atrial fibrillation, occasional PVC noted, unifocal  Axis: Normal  Intervals:nonspecific intraventricular conduction delay  ST&T Change: None   PROCEDURES  Procedure(s) performed (including Critical Care):  Procedures   ____________________________________________   INITIAL IMPRESSION / ASSESSMENT AND PLAN / ED COURSE      65 year old male with past medical history of hypertension, hyperlipidemia, diabetes, CAD status post CABG, CHF, permanent atrial fibrillation on Xarelto, COPD, and stroke who presents to the ED following episode of dizziness and palpitations while in an argument with his landlord.  EKG shows atrial fibrillation with controlled rate, no ischemic changes noted.  Labs are unremarkable, no anemia or electrolyte abnormality.  Patient states all symptoms have resolved, he is appropriate for discharge home with PCP or cardiology follow-up.  He was counseled to return to the ED for new or worsening symptoms, patient agrees with plan.      ____________________________________________   FINAL CLINICAL IMPRESSION(S) / ED DIAGNOSES  Final diagnoses:  Palpitations     ED Discharge Orders     None        Note:  This document was prepared using Dragon voice recognition software and may  include unintentional dictation errors.    Blake Divine, MD 02/11/21 [phone]

## 2021-02-11 NOTE — Telephone Encounter (Signed)
   Telephone encounter was:  Successful.  02/11/2021 Name: Evan [name] MRN: [id number] DOB: 07/03/1956  Evan [name] is a 65 y.o. year old male who is a primary care patient of [name], [name], NP . The community resource team was consulted for assistance with [place] guide performed the following interventions: Patient provided with information about care guide support team and interviewed to confirm resource needs.  Follow Up Plan:  Care guide will follow up with patient by phone over the next [place] , [place], Care Management  [phone] [address] , [place] [address] Email : [email or web address]

## 2021-02-13 DIAGNOSIS — Z5189 Encounter for other specified aftercare: Secondary | ICD-10-CM | POA: Diagnosis not present

## 2021-02-14 ENCOUNTER — Telehealth: Payer: Self-pay

## 2021-02-14 NOTE — Telephone Encounter (Signed)
Copied from [place] [id number]. Topic: General - Other >> Feb 09, 2021  8:40 AM Celene Kras wrote: Reason for CRM: Pt called stating that he has an appt with Cardiologist and is needing to have his most recent lab results. Please advise.

## 2021-02-14 NOTE — Telephone Encounter (Signed)
Spoke with patient and was notified that he spoke with someone named Delana Meyer, that he thinks may have been a Education officer, museum and states the lady was rude and hung up on him, before getting him set up for a caregiver at home. Please advise.

## 2021-02-16 NOTE — Telephone Encounter (Signed)
Attempted to reach social worker [name] on several attempts since Monday and have not yet an update. Will try again tomorrow to try and update the patient.

## 2021-02-17 ENCOUNTER — Ambulatory Visit: Payer: Medicare Other

## 2021-02-18 ENCOUNTER — Ambulatory Visit: Payer: Self-pay

## 2021-02-18 ENCOUNTER — Telehealth: Payer: Self-pay | Admitting: Nurse Practitioner

## 2021-02-18 NOTE — Telephone Encounter (Signed)
FYI

## 2021-02-18 NOTE — Telephone Encounter (Signed)
Called pt advised of [name]'s message. Pt states he is ok

## 2021-02-18 NOTE — Telephone Encounter (Signed)
Pt states his new caregiver came today and only stayed for an hour. He said he felt threatened by her. She cleaned the kitchen after he asked her. It was her first day her. He said it did not go very well with her.  He said she was supposed to come 6/02. The he started saying he is shaking and going to have another stroke. His "nerves are shot and I would be better off in the hospital" he said. He said he is dizzy as well.  At this point I will have him triaged by the nurse.

## 2021-02-18 NOTE — Telephone Encounter (Signed)
Pt. Reports a caregiver from [employer] came today and stayed "one hour and said I was threatening her, but I wasn't and she left.I'm supposed to have a caregiver 77 hours a week." Pt. Had told [employer] agent he was having palpitations. Pt. Told nurse he "feels fine today." States he is having problems with his landlord as well."They say I'm not paying my rent, but I am." "I don't feel safe here." States he wants his PCP to be aware.

## 2021-02-19 ENCOUNTER — Emergency Department: Payer: Medicare Other

## 2021-02-19 ENCOUNTER — Emergency Department
Admission: EM | Admit: 2021-02-19 | Discharge: 2021-02-19 | Disposition: A | Discharge status: Home / Self Care | Payer: Medicare Other | Attending: Emergency Medicine | Admitting: Emergency Medicine

## 2021-02-19 ENCOUNTER — Other Ambulatory Visit: Payer: Self-pay

## 2021-02-19 DIAGNOSIS — Z7901 Long term (current) use of anticoagulants: Secondary | ICD-10-CM | POA: Insufficient documentation

## 2021-02-19 DIAGNOSIS — Z955 Presence of coronary angioplasty implant and graft: Secondary | ICD-10-CM | POA: Diagnosis not present

## 2021-02-19 DIAGNOSIS — I499 Cardiac arrhythmia, unspecified: Secondary | ICD-10-CM | POA: Diagnosis not present

## 2021-02-19 DIAGNOSIS — Z8546 Personal history of malignant neoplasm of prostate: Secondary | ICD-10-CM | POA: Diagnosis not present

## 2021-02-19 DIAGNOSIS — J45909 Unspecified asthma, uncomplicated: Secondary | ICD-10-CM | POA: Insufficient documentation

## 2021-02-19 DIAGNOSIS — I517 Cardiomegaly: Secondary | ICD-10-CM | POA: Diagnosis not present

## 2021-02-19 DIAGNOSIS — Z7982 Long term (current) use of aspirin: Secondary | ICD-10-CM | POA: Diagnosis not present

## 2021-02-19 DIAGNOSIS — Z7984 Long term (current) use of oral hypoglycemic drugs: Secondary | ICD-10-CM | POA: Insufficient documentation

## 2021-02-19 DIAGNOSIS — Z7951 Long term (current) use of inhaled steroids: Secondary | ICD-10-CM | POA: Insufficient documentation

## 2021-02-19 DIAGNOSIS — R6889 Other general symptoms and signs: Secondary | ICD-10-CM | POA: Diagnosis not present

## 2021-02-19 DIAGNOSIS — L03115 Cellulitis of right lower limb: Secondary | ICD-10-CM | POA: Insufficient documentation

## 2021-02-19 DIAGNOSIS — I5032 Chronic diastolic (congestive) heart failure: Secondary | ICD-10-CM | POA: Diagnosis not present

## 2021-02-19 DIAGNOSIS — I2510 Atherosclerotic heart disease of native coronary artery without angina pectoris: Secondary | ICD-10-CM | POA: Insufficient documentation

## 2021-02-19 DIAGNOSIS — R0789 Other chest pain: Secondary | ICD-10-CM | POA: Diagnosis not present

## 2021-02-19 DIAGNOSIS — I110 Hypertensive heart disease with heart failure: Secondary | ICD-10-CM | POA: Diagnosis not present

## 2021-02-19 DIAGNOSIS — Z951 Presence of aortocoronary bypass graft: Secondary | ICD-10-CM | POA: Insufficient documentation

## 2021-02-19 DIAGNOSIS — R600 Localized edema: Secondary | ICD-10-CM | POA: Diagnosis not present

## 2021-02-19 DIAGNOSIS — J449 Chronic obstructive pulmonary disease, unspecified: Secondary | ICD-10-CM | POA: Diagnosis not present

## 2021-02-19 DIAGNOSIS — M79604 Pain in right leg: Secondary | ICD-10-CM | POA: Diagnosis not present

## 2021-02-19 DIAGNOSIS — R079 Chest pain, unspecified: Secondary | ICD-10-CM

## 2021-02-19 DIAGNOSIS — R0902 Hypoxemia: Secondary | ICD-10-CM | POA: Diagnosis not present

## 2021-02-19 DIAGNOSIS — Z79899 Other long term (current) drug therapy: Secondary | ICD-10-CM | POA: Insufficient documentation

## 2021-02-19 DIAGNOSIS — Z743 Need for continuous supervision: Secondary | ICD-10-CM | POA: Diagnosis not present

## 2021-02-19 DIAGNOSIS — E11319 Type 2 diabetes mellitus with unspecified diabetic retinopathy without macular edema: Secondary | ICD-10-CM | POA: Diagnosis not present

## 2021-02-19 DIAGNOSIS — Z8616 Personal history of COVID-19: Secondary | ICD-10-CM | POA: Diagnosis not present

## 2021-02-19 LAB — COMPREHENSIVE METABOLIC PANEL
ALT: 16 U/L (ref 0–44)
AST: 22 U/L (ref 15–41)
Albumin: 3.8 g/dL (ref 3.5–5.0)
Alkaline Phosphatase: 77 U/L (ref 38–126)
Anion gap: 8 (ref 5–15)
BUN: 22 mg/dL (ref 8–23)
CO2: 23 mmol/L (ref 22–32)
Calcium: 9 mg/dL (ref 8.9–10.3)
Chloride: 109 mmol/L (ref 98–111)
Creatinine, Ser: 0.96 mg/dL (ref 0.61–1.24)
GFR, Estimated: >60 mL/min (ref >60)
Glucose, Bld: 138 mg/dL — ABNORMAL HIGH (ref 70–99)
Potassium: 3.6 mmol/L (ref 3.5–5.1)
Sodium: 140 mmol/L (ref 135–145)
Total Bilirubin: 0.7 mg/dL (ref 0.3–1.2)
Total Protein: 7.1 g/dL (ref 6.5–8.1)

## 2021-02-19 LAB — CBC
HCT: 43.7 % (ref 39.0–52.0)
Hemoglobin: 14 g/dL (ref 13.0–17.0)
MCH: 28.5 pg (ref 26.0–34.0)
MCHC: 32 g/dL (ref 30.0–36.0)
MCV: 88.8 fL (ref 80.0–100.0)
Platelets: 259 10*3/uL (ref 150–400)
RBC: 4.92 MIL/uL (ref 4.22–5.81)
RDW: 15.7 % — ABNORMAL HIGH (ref 11.5–15.5)
WBC: 10.9 10*3/uL — ABNORMAL HIGH (ref 4.0–10.5)
nRBC: 0 % (ref 0.0–0.2)

## 2021-02-19 LAB — TROPONIN I (HIGH SENSITIVITY)
Troponin I (High Sensitivity): 20 ng/L — ABNORMAL HIGH (ref <18)
Troponin I (High Sensitivity): 23 ng/L — ABNORMAL HIGH (ref <18)

## 2021-02-19 IMAGING — US US EXTREM LOW VENOUS*R*
1 series · 14 of 24 positions shown · non-contrast
Comparison: None.

CLINICAL DATA: Chest pain.  Right lower extremity edema and pain.

EXAM:
RIGHT LOWER EXTREMITY VENOUS DOPPLER ULTRASOUND
TECHNIQUE: Gray-scale sonography with compression, as well as color and duplex
ultrasound, were performed to evaluate the deep venous system(s)
from the level of the common femoral vein through the popliteal and
proximal calf veins.

[Series 1: us venous img lower uni right (dvt) · portal-venous · 14 of 41 slices shown]
[im 1/41]
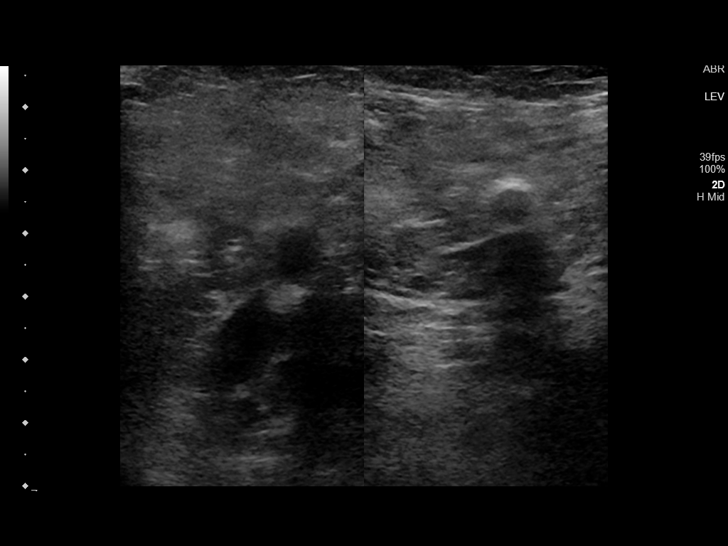
[im 4/41]
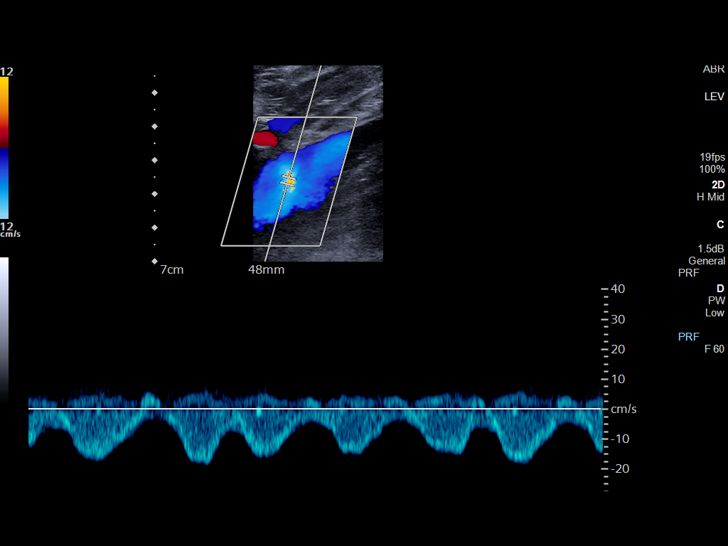
[im 7/41]
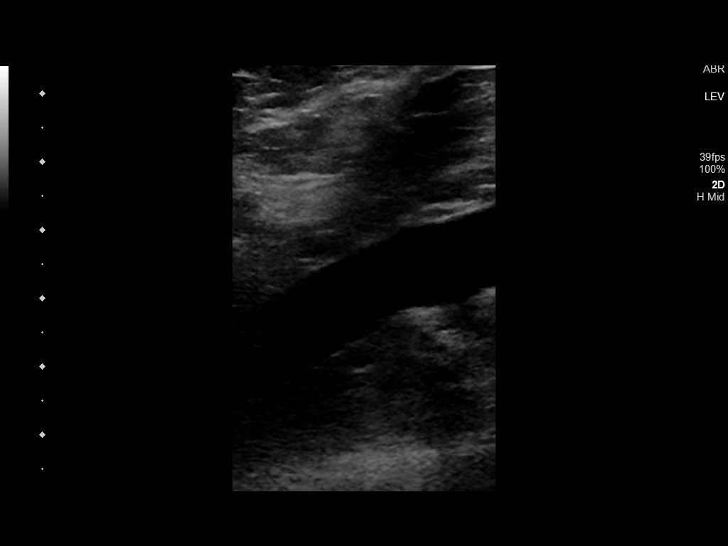
[im 11/41]
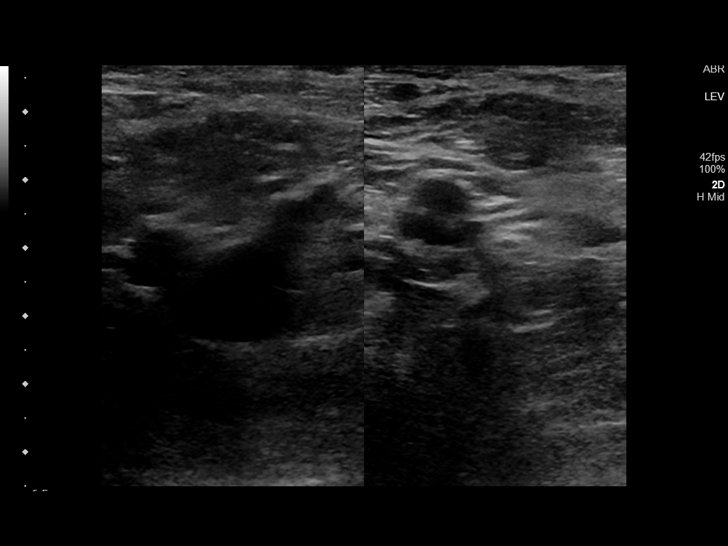
[im 13/41]
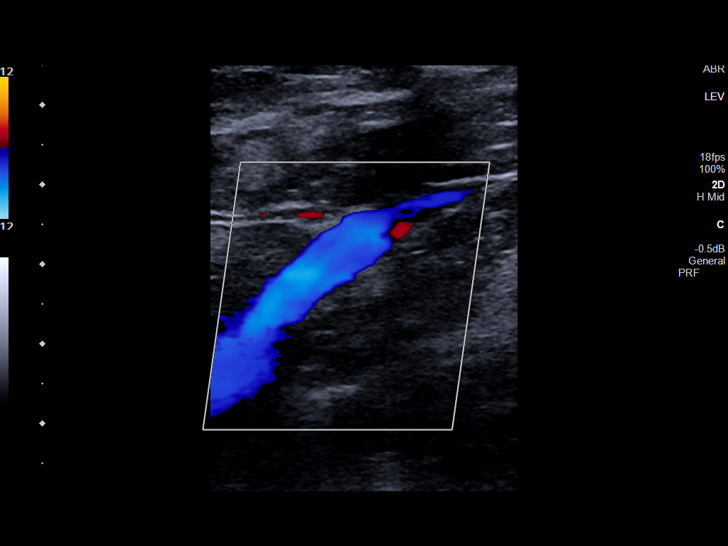
[im 16/41]
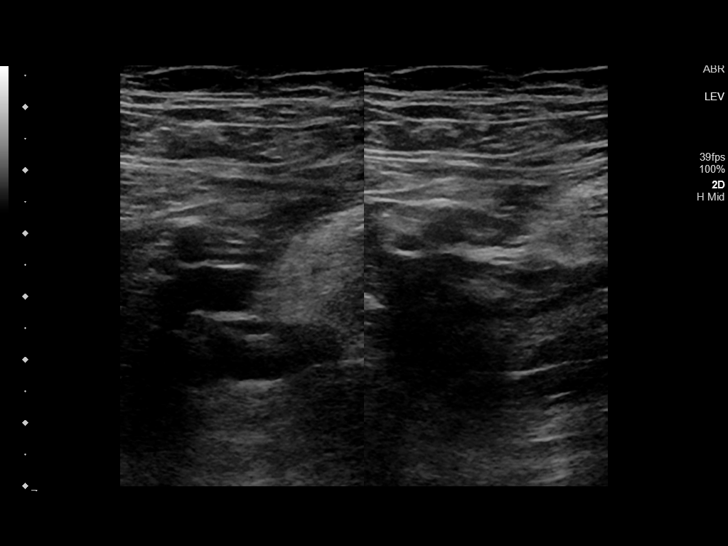
[im 20/41]
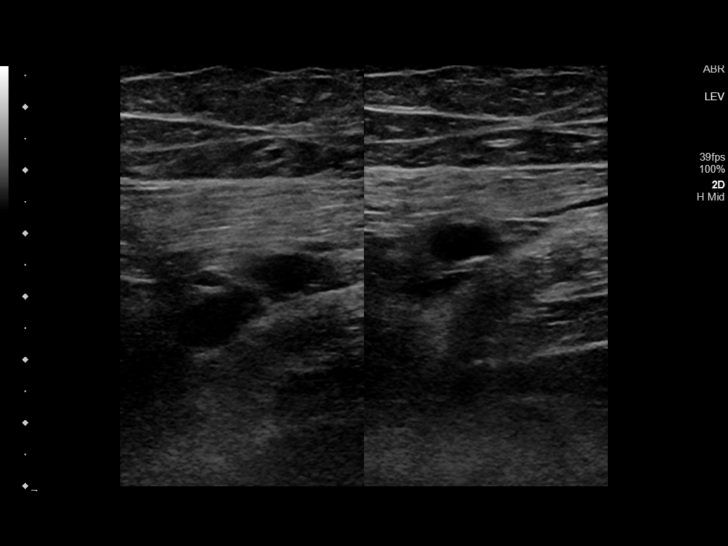
[im 21/41]
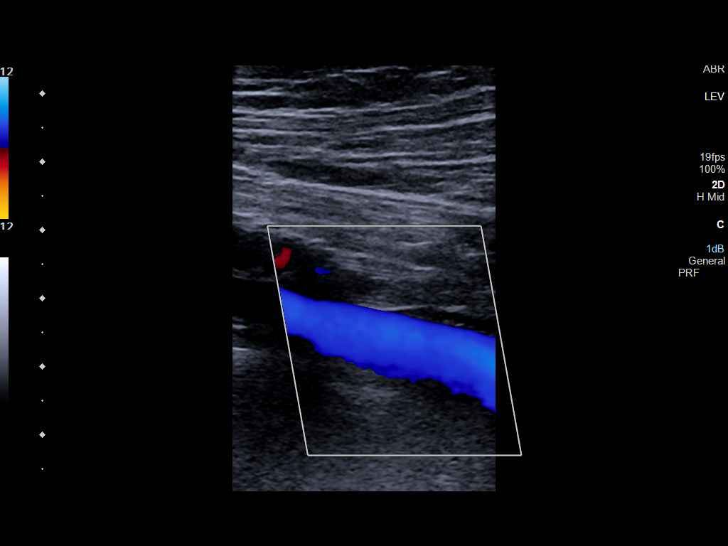
[im 25/41]
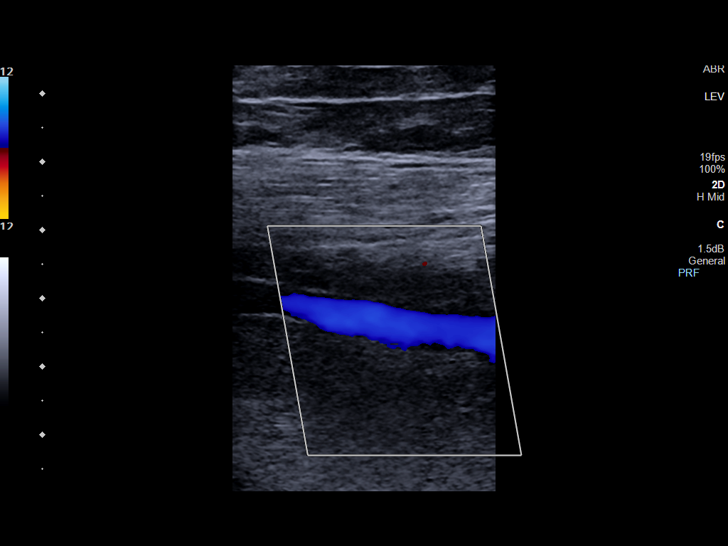
[im 28/41]
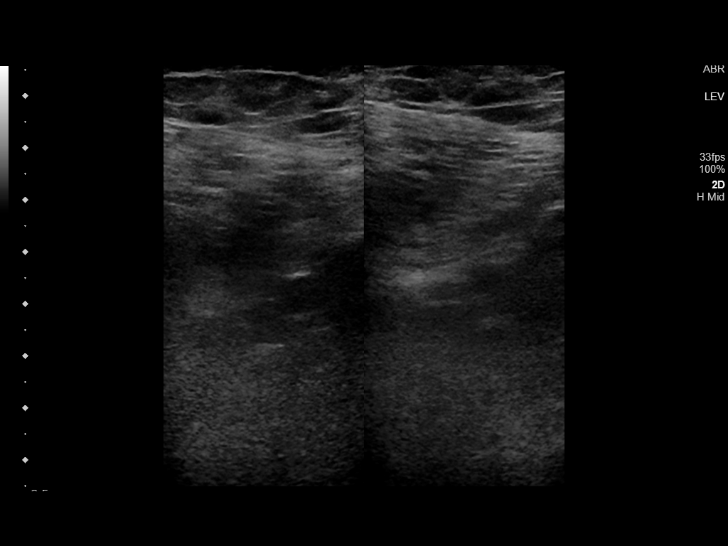
[im 32/41]
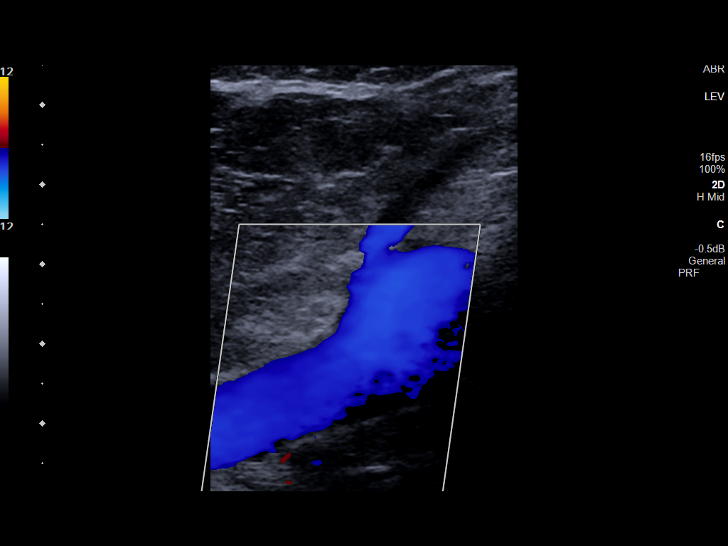
[im 34/41]
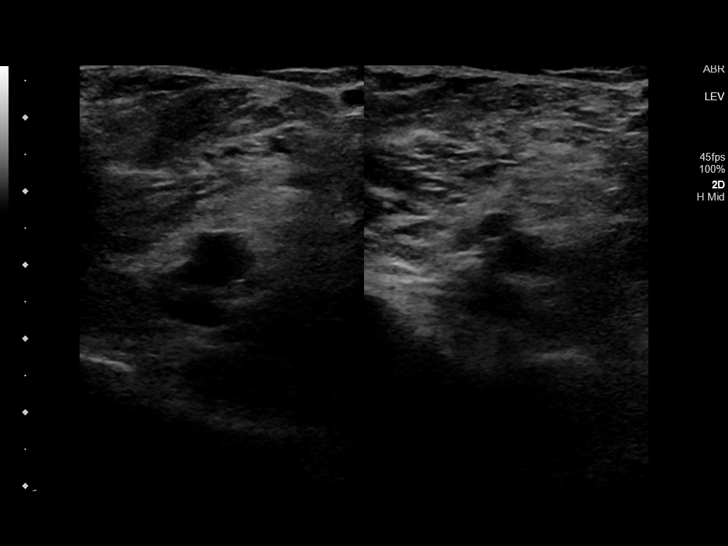
[im 37/41]
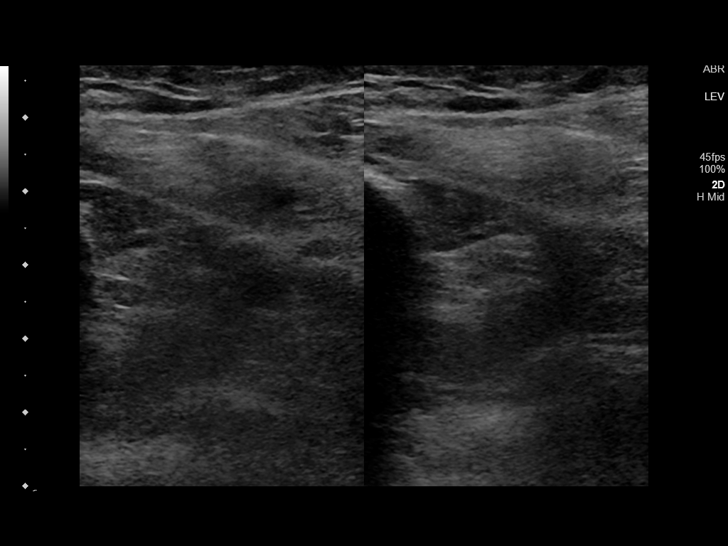
[im 41/41]
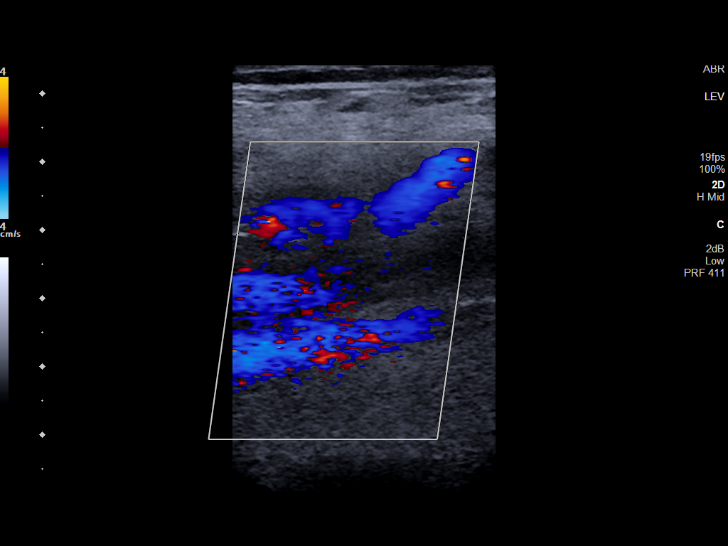

[14 of 24 positions shown; findings below may reference images not displayed]

FINDINGS: VENOUS

Normal compressibility of the common femoral, superficial femoral,
and popliteal veins, as well as the visualized calf veins.
Visualized portions of profunda femoral vein and great saphenous
vein unremarkable. No filling defects to suggest DVT on grayscale or
color Doppler imaging. Doppler waveforms show normal direction of
venous flow, normal respiratory plasticity and response to
augmentation.

Limited views of the contralateral common femoral vein are
unremarkable.

OTHER

None.

Limitations: none
IMPRESSION: Negative.

## 2021-02-19 IMAGING — DX DG CHEST 1V PORT
1 series · 1 of 1 positions shown · non-contrast
Comparison: Chest x-ray dated [DATE].

CLINICAL DATA: Chest pain.

EXAM:
PORTABLE CHEST 1 VIEW

[chest ap]
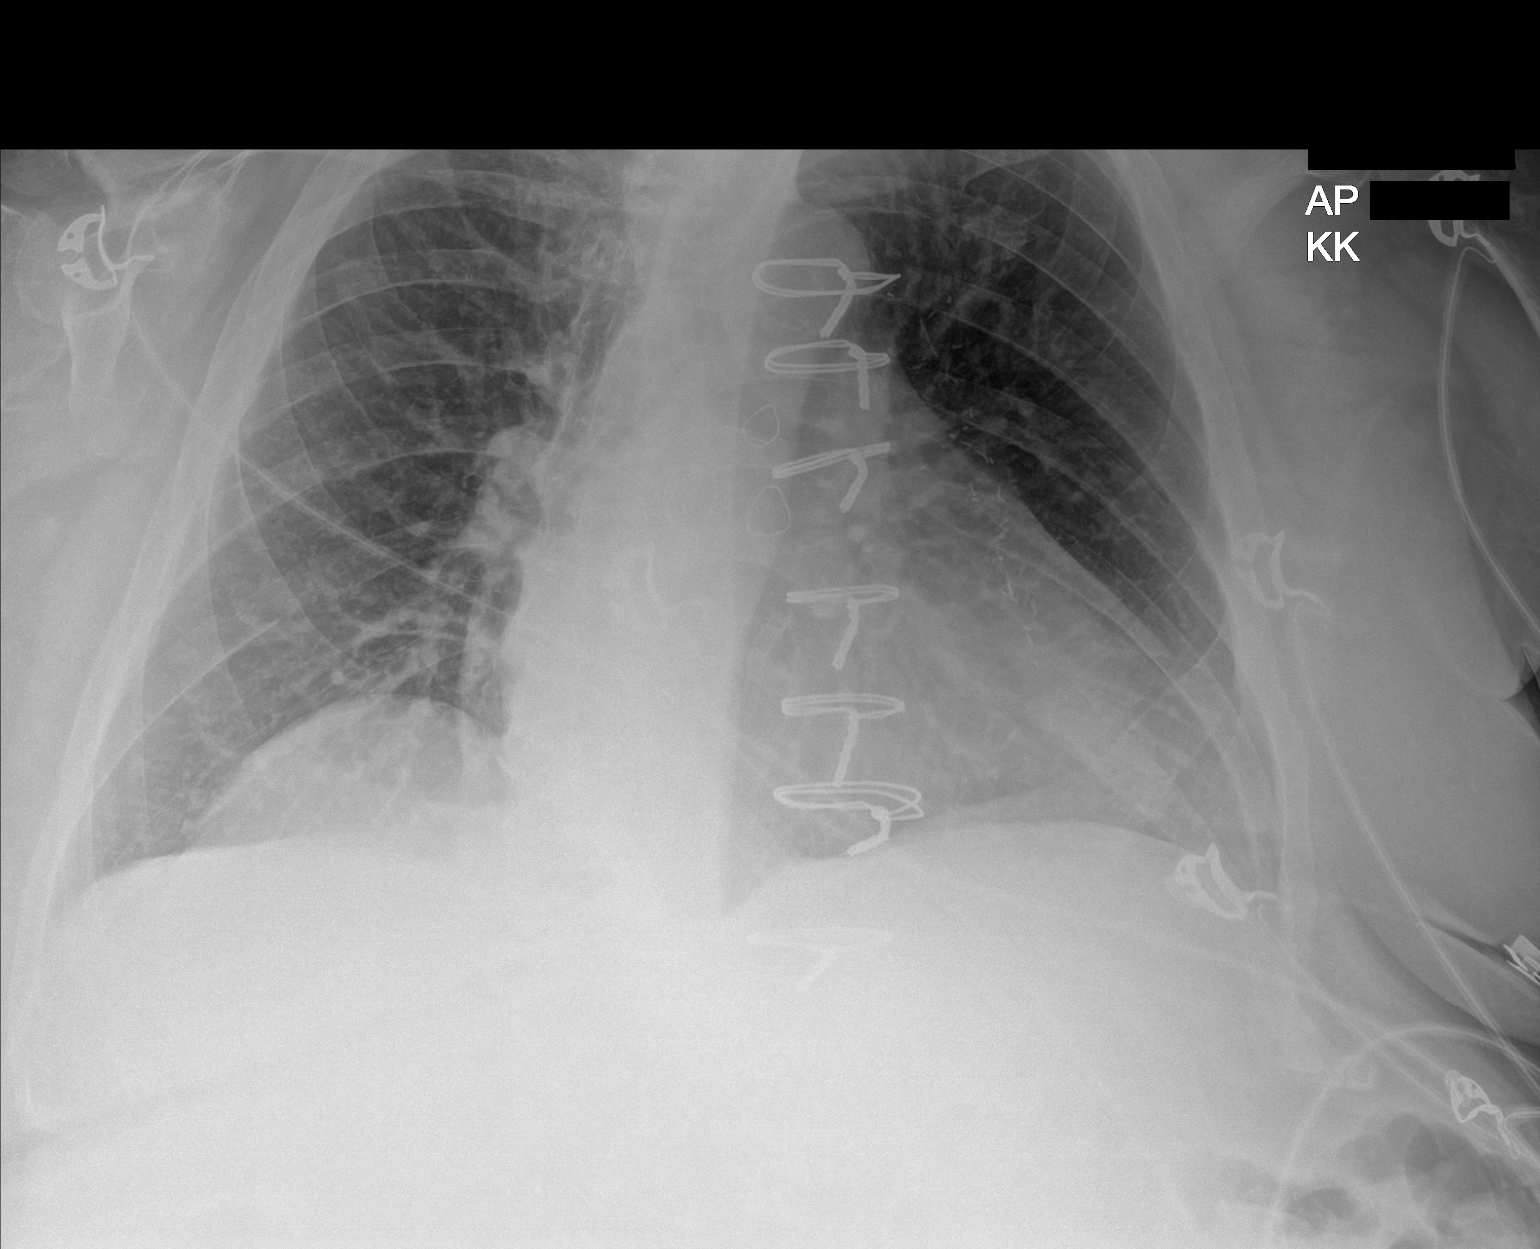

[1 of 1 positions shown; findings below may reference images not displayed]

FINDINGS: Stable cardiomegaly status post CABG. Normal pulmonary vascularity.
No focal consolidation, pleural effusion, or pneumothorax. Unchanged
eventration of the right hemidiaphragm. No acute osseous
abnormality.
IMPRESSION: 1. No active disease.

## 2021-02-19 MED ORDER — CLINDAMYCIN HCL 300 MG PO CAPS: 300.0000 mg | ORAL_CAPSULE | Freq: Three times a day (TID) | ORAL | 0 refills | Status: AC

## 2021-02-19 MED ORDER — CLINDAMYCIN HCL 150 MG PO CAPS
300.0000 mg | ORAL_CAPSULE | Freq: Once | ORAL | Status: AC
  Administered 2021-02-19: 300 mg via ORAL
  Filled 2021-02-19: qty 2

## 2021-02-19 NOTE — ED Notes (Signed)
Portable Xray at bedside at this time.

## 2021-02-19 NOTE — ED Notes (Signed)
Dr. Kerman Passey at bedside assessing pt at this time.

## 2021-02-19 NOTE — ED Triage Notes (Signed)
Pt BIB [facility] from home C/O CP and RLE discoloration.

## 2021-02-19 NOTE — ED Provider Notes (Signed)
[facility] Emergency Department Provider Note  Time seen: 4:11 PM  I have reviewed the triage vital signs and the nursing notes.   HISTORY  Chief Complaint Chest pain  HPI Evan [name] is a 65 y.o. male with a past medical history of anxiety, CHF, CAD status post CABG, diabetes, hypertension, hyperlipidemia, presents to the emergency department for multiple complaints including chest pain and leg pain.  According to the patient he has been under a lot of stress recently.  States he is currently getting evicted from his apartment.  Patient states he has been feeling tightness sensation in the chest at times.  Also states over the past several weeks he has noticed some redness and discomfort to his right lower extremity.  Patient denies any shortness of breath cough or fever.  Patient states he believes his symptoms are due to to stress.   Past Medical History:  Diagnosis Date   Anginal pain ([place])    Anxiety disorder    Asthma    Atresia of esophagus without fistula    CAD (coronary artery disease)    Cellulitis    CHF (congestive heart failure) (HCC)    NYHA CLASS III,CHRONIC,DIASTOLIC   COPD (chronic obstructive pulmonary disease) ([place])    COVID-19    Diabetes mellitus without complication (HCC)    Edema    RIGHT LOWER LEG   Gastroesophageal reflux    H/O: GI bleed    History of pneumonia    Remote   History of scarlet fever    Childhood   Hyperlipidemia    Hypertension    Myocardial infarction ([place]) 2009   Obesity    Obstructive sleep apnea    Pain    CHRONIC BACK / ABDOMINAL   Panic disorder    Peripheral venous insufficiency    PTSD (post-traumatic stress disorder)    Retinopathy    DIABETIC   Stasis, venous    Stroke [facility])    Vertigo     Patient Active Problem List   Diagnosis Date Noted   Other thrombophilia ([place]) 12/25/2020   Vitamin D deficiency 12/04/2020   Vitamin B12 deficiency 12/04/2020   Stenosis of right carotid  artery 12/03/2020   History of CVA (cerebrovascular accident) 11/06/2020   History of 2019 novel coronavirus disease (COVID-19) 09/27/2020   Atherosclerosis of aorta ([place]) 12/04/2019   Persistent atrial fibrillation ([place]) 09/12/2019   CAD (coronary artery disease) 09/12/2019   Chronic venous stasis 09/12/2019   Hoarding disorder 09/12/2019   Cervical spinal stenosis 09/12/2019   Diabetic retinopathy ([place]) 09/12/2019   COPD, mild ([place]) 09/12/2019   Osteoporosis 09/09/2019   History of prostate cancer 09/09/2019   Anxiety 08/10/2019   Obstructive sleep apnea on CPAP 08/10/2019   Hyperlipidemia associated with type 2 diabetes mellitus ([place]) 08/10/2019   Chronic diastolic CHF (congestive heart failure) ([place]) 01/09/2014   Esophageal dysmotility 09/12/2013   Type 2 diabetes mellitus with morbid obesity ([place]) 02/14/2012   Obesity, morbid (more than 100 lbs over ideal weight or BMI > 40) ([place]) 07/26/2011   OLD MYOCARDIAL INFARCTION 03/02/2010   Hypertension associated with diabetes ([place]) 03/20/2009   CORONARY ATHEROSCLEROSIS, ARTERY BYPASS GRAFT 03/20/2009    Past Surgical History:  Procedure Laterality Date   CARDIAC CATHETERIZATION     CATARACT EXTRACTION Left    CATARACT EXTRACTION W/PHACO Right 05/03/2017   Procedure: CATARACT EXTRACTION PHACO AND INTRAOCULAR LENS PLACEMENT (IOC);  Surgeon: Leandrew Koyanagi, MD;  Location: [facility] ORS;  Service: Ophthalmology;  Laterality:  Right;  [place] 00:35.3 AP% 12.3 CDE 4.33 Fluid Pack lot # [id number]        COLONOSCOPY WITH PROPOFOL N/A 11/26/2020   Procedure: COLONOSCOPY WITH PROPOFOL;  Surgeon: Jonathon Bellows, MD;  Location[facility] ENDOSCOPY;  Service: Gastroenterology;  Laterality: N/A;  ANNETTE TO PICK UP [phone]   CORONARY ANGIOPLASTY WITH STENT PLACEMENT  2002   CORONARY ANGIOPLASTY WITH STENT PLACEMENT  1999   CORONARY ARTERY BYPASS GRAFT     x7   ESOPHAGOGASTRODUODENOSCOPY N/A 09/19/2016   Procedure: ESOPHAGOGASTRODUODENOSCOPY (EGD);   Surgeon: Lollie Sails, MD;  Location: [facility] ENDOSCOPY;  Service: Endoscopy;  Laterality: N/A;   ESOPHAGOGASTRODUODENOSCOPY (EGD) WITH PROPOFOL  11/26/2020   Procedure: ESOPHAGOGASTRODUODENOSCOPY (EGD) WITH PROPOFOL;  Surgeon: Jonathon Bellows, MD;  Location: [facility] ENDOSCOPY;  Service: Gastroenterology;;    Prior to Admission medications   Medication Sig Start Date End Date Taking? Authorizing Provider  acetaminophen (TYLENOL) 500 MG tablet Take 500 mg by mouth every 6 (six) hours as needed.    [provider]  aspirin 81 MG chewable tablet Chew 1 tablet (81 mg total) by mouth daily. 11/23/20   [name], Henrine Screws T, NP  atorvastatin (LIPITOR) 80 MG tablet Take 1 tablet (80 mg total) by mouth daily. 11/26/20 02/19/22  [name], Henrine Screws T, NP  Blood Glucose Monitoring Suppl (ONETOUCH VERIO) w/Device KIT Use to check blood sugar 3 to 4 times a day and document.  Please bring to visits for review. 07/08/20   [name], Henrine Screws T, NP  budesonide-formoterol (SYMBICORT) 80-4.5 MCG/ACT inhaler Inhale 2 puffs into the lungs 2 (two) times daily. 09/24/20   [name] T, NP  Cholecalciferol 125 MCG (5000 UT) TABS Take 1 tablet (5,000 Units total) by mouth daily. 12/04/20   [name], NP  Elastic Bandages & Supports (MEDICAL COMPRESSION STOCKINGS) MISC 1 Package by Does not apply route daily. 08/28/20   Sable Feil, PA-C  empagliflozin (JARDIANCE) 10 MG TABS tablet Take 1 tablet (10 mg total) by mouth daily. 07/08/20   [name], Henrine Screws T, NP  ezetimibe (ZETIA) 10 MG tablet Take 1 tablet (10 mg total) by mouth daily. 10/07/20   [name], Henrine Screws T, NP  furosemide (LASIX) 40 MG tablet TAKE 2 TABLETS BY MOUTH ONCE EVERY MORNING AND 1 TABLET ONCE EVERY EVENING 01/20/21   [name], [name] T, NP  glucose blood (ONETOUCH VERIO) test strip Use to check blood sugar 3 to 4 times a day. 07/08/20   [name], Henrine Screws T, NP  metoprolol succinate (TOPROL XL) 50 MG 24 hr tablet Take 1 tablet (50 mg total) by mouth daily. 07/08/20    [name], NP  MODERNA COVID-19 VACCINE 100 MCG/0.5ML injection  08/28/20   [provider]  nitroGLYCERIN (NITROSTAT) 0.4 MG SL tablet Place 1 tablet (0.4 mg total) under the tongue every 5 (five) minutes as needed for chest pain. 10/10/19   [name], Henrine Screws T, NP  olmesartan (BENICAR) 5 MG tablet Take 2 tablets (10 mg total) by mouth daily. 12/03/20   [name], Henrine Screws T, NP  Omega-3 Fatty Acids (OMEGA 3 500 PO) Take 500 mg by mouth daily.    [provider]  OneTouch Delica Lancets 16L MISC Use to check blood sugar 2-3 times a day. 09/29/20   [name], Henrine Screws T, NP  pantoprazole (PROTONIX) 40 MG tablet Take 1 tablet (40 mg total) by mouth daily. 04/08/20   [name], Henrine Screws T, NP  risperiDONE (RISPERDAL) 0.5 MG tablet Take 1 tablet (0.5 mg total) by mouth 2 (two) times daily.  07/08/20   [name], Henrine Screws T, NP  rivaroxaban (XARELTO) 20 MG TABS tablet Take 1 tablet (20 mg total) by mouth at bedtime. 07/08/20   [name] T, NP  sharps container 1 each by Does not apply route as needed. 09/29/20   [name], NP  Skin Protectants, Misc. (EUCERIN) cream Apply topically as needed for dry skin. 08/28/20   Sable Feil, PA-C  triamcinolone cream (KENALOG) 0.1 % APPLY TO AFFECTED AREA(s) TWICE DAILY 12/29/20   [name], Henrine Screws T, NP  vitamin B-12 (CYANOCOBALAMIN) 1000 MCG tablet Take 1 tablet (1,000 mcg total) by mouth daily. 12/04/20   [name] T, NP    Allergies  Allergen Reactions   Penicillin G Hives   Sulfa Antibiotics Hives   Tiotropium    Zoloft [Sertraline Hcl] Other (See Comments)    Family History  Problem Relation Age of Onset   Heart attack Mother    Hypertension Mother    Hyperlipidemia Mother    Heart attack Brother 64       MI   Coronary artery disease Other     Social History Social History   Tobacco Use   Smoking status: Never   Smokeless tobacco: Never  Vaping Use   Vaping Use: Never used  Substance Use Topics   Alcohol use: No    Drug use: No    Review of Systems Constitutional: Negative for fever. Cardiovascular: Chest discomfort/tightness Respiratory: Negative for shortness of breath. Gastrointestinal: Negative for abdominal pain Genitourinary: Negative for urinary compaints Musculoskeletal: Redness and discomfort in right lower extremity Skin: Mild redness right lower extremity Neurological: Negative for headache All other ROS negative  ____________________________________________   PHYSICAL EXAM:  Constitutional: Alert and oriented. Well appearing and in no distress. Eyes: Normal exam ENT      Head: Normocephalic and atraumatic.      Mouth/Throat: Mucous membranes are moist. Cardiovascular: Normal rate, regular rhythm.  Respiratory: Normal respiratory effort without tachypnea nor retractions. Breath sounds are clear Gastrointestinal: Soft and nontender. No distention.   Musculoskeletal: Nontender with normal range of motion in all extremities.  Neurologic:  Normal speech and language. No gross focal neurologic deficits Skin:  Skin is warm, dry and intact.  Psychiatric: Mood and affect are normal.   ____________________________________________    EKG  EKG viewed and interpreted by myself shows atrial fibrillation 101 bpm with a slightly widened QRS, normal axis, largely normal intervals, nonspecific ST changes.  Largely unchanged from prior EKG.  Patient appears to be on Xarelto for chronic atrial fibrillation.  ____________________________________________    RADIOLOGY  Chest x-ray negative. Ultrasound negative for DVT.  ____________________________________________   INITIAL IMPRESSION / ASSESSMENT AND PLAN / ED COURSE  Pertinent labs & imaging results that were available during my care of the patient were reviewed by me and considered in my medical decision making (see chart for details).   Patient presents emergency department for chest pain and right lower extremity discomfort,  states both symptoms have been ongoing and intermittent over the past several weeks.  States he believes it is stress-induced states he is currently getting evicted from his apartment.  We will check labs including cardiac enzymes, we will obtain right lower extremity ultrasound to rule out DVT.  Changes in the right lower extremity appear more consistent with chronic venous stasis.  Neuro vastly intact.  Could possibly represent mild cellulitis.  Chest pain could represent stress/anxiety versus ACS for his chest wall discomfort.  We will check labs, EKG, chest  x-ray.  EKG largely unchanged from prior EKGs, patient appears to be in chronic atrial fibrillation with Xarelto for anticoagulation.  Labs chest x-ray pending.  Troponin is unchanged from initial troponin.  Reassuring work-up overall.  Given the mild skin color changes to the right lower extremity we will start the patient on antibiotic as a precaution.  Patient agreeable to plan of care.  Evan [name] was evaluated in Emergency Department on 02/19/2021 for the symptoms described in the history of present illness. He was evaluated in the context of the global COVID-19 pandemic, which necessitated consideration that the patient might be at risk for infection with the SARS-CoV-2 virus that causes COVID-19. Institutional protocols and algorithms that pertain to the evaluation of patients at risk for COVID-19 are in a state of rapid change based on information released by regulatory bodies including the CDC and federal and state organizations. These policies and algorithms were followed during the patient's care in the ED.  ____________________________________________   FINAL CLINICAL IMPRESSION(S) / ED DIAGNOSES  Chest pain Right lower extremity pain   Harvest Dark, MD 02/19/21 2150

## 2021-02-21 ENCOUNTER — Ambulatory Visit: Payer: Self-pay | Admitting: Licensed Clinical Social Worker

## 2021-02-21 DIAGNOSIS — I25118 Atherosclerotic heart disease of native coronary artery with other forms of angina pectoris: Secondary | ICD-10-CM

## 2021-02-21 DIAGNOSIS — F339 Major depressive disorder, recurrent, unspecified: Secondary | ICD-10-CM

## 2021-02-21 DIAGNOSIS — I5032 Chronic diastolic (congestive) heart failure: Secondary | ICD-10-CM

## 2021-02-21 DIAGNOSIS — E1169 Type 2 diabetes mellitus with other specified complication: Secondary | ICD-10-CM

## 2021-02-21 DIAGNOSIS — F419 Anxiety disorder, unspecified: Secondary | ICD-10-CM

## 2021-02-21 DIAGNOSIS — F423 Hoarding disorder: Secondary | ICD-10-CM

## 2021-02-22 ENCOUNTER — Telehealth: Payer: Self-pay | Admitting: *Deleted

## 2021-02-22 NOTE — Telephone Encounter (Signed)
   Telephone encounter was:  Successful.  02/22/2021 Name: Evan [name] MRN: [id number] DOB: [birth year]  Evan [name] is a 65 y.o. year old male who is a primary care patient of [name], [name], NP . The community resource team was consulted for assistance with [place] guide performed the following interventions: Patient provided with information about care guide support team and interviewed to confirm resource needs.  Follow Up Plan:  Talked with member and he is very satisfied new aid Enid Derry ,  Has completed application for snap and delivered it , has talked with landlord about staying compliant with the rental if he needs further assistance to reach out [place], Care Management  [phone] [address] , [place] [address] Email : [email or web address]

## 2021-02-23 ENCOUNTER — Ambulatory Visit: Payer: Self-pay | Admitting: Licensed Clinical Social Worker

## 2021-02-23 DIAGNOSIS — F339 Major depressive disorder, recurrent, unspecified: Secondary | ICD-10-CM

## 2021-02-23 DIAGNOSIS — I5032 Chronic diastolic (congestive) heart failure: Secondary | ICD-10-CM

## 2021-02-23 DIAGNOSIS — F419 Anxiety disorder, unspecified: Secondary | ICD-10-CM

## 2021-02-23 DIAGNOSIS — E1169 Type 2 diabetes mellitus with other specified complication: Secondary | ICD-10-CM

## 2021-02-23 DIAGNOSIS — Z8673 Personal history of transient ischemic attack (TIA), and cerebral infarction without residual deficits: Secondary | ICD-10-CM

## 2021-02-23 NOTE — Patient Instructions (Signed)
Visit Information   Goals Addressed               This Visit's Progress     Patient Stated     SW-"I need more help." (pt-stated)   On track     Patient Goals/Self-Care Activities: Over the next 120 days Attend all scheduled appointments with providers Contact office with any questions or concerns [facility] transportation number for upcoming Vascular appt - learn and use visualization or guided imagery - practice relaxation or meditation daily - practice positive thinking and self-talk I will coordinate with your doctor to assist with obtaining a new in-home aid      Other     SW-Track and Manage My Symptoms-Depression   On track     Timeframe:  Long-Range Goal Priority:  High Start Date:   08/09/20                         Expected End Date: 04/06/21                    Follow Up Date- 03/17/21  Patient Goals/Self-Care Activities: Over the next 120 days Attend all scheduled appointments with providers Contact office with any questions or concerns [facility] transportation number for upcoming Vascular appt - learn and use visualization or guided imagery - practice relaxation or meditation daily - practice positive thinking and self-talk I will coordinate with your doctor to assist with obtaining a new in-home aid        Patient verbalizes understanding of instructions provided today and agrees to view in [place].   Telephone follow up appointment with care management team member scheduled for:03/17/21  [name], MSW, [email or web address] Phone [phone] 10:20 AM
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
## 2021-02-25 ENCOUNTER — Ambulatory Visit (INDEPENDENT_AMBULATORY_CARE_PROVIDER_SITE_OTHER): Payer: Medicare Other

## 2021-02-25 VITALS — Ht 64.0 in | Wt 220.0 lb

## 2021-02-25 DIAGNOSIS — Z0000 Encounter for general adult medical examination without abnormal findings: Secondary | ICD-10-CM

## 2021-02-25 NOTE — Patient Instructions (Signed)
Evan [name] , Thank you for taking time to come for your Medicare Wellness Visit. I appreciate your ongoing commitment to your health goals. Please review the following plan we discussed and let me know if I can assist you in the future.   Screening recommendations/referrals: Colonoscopy: completed 11/26/2020, due 11/27/2023 Recommended yearly ophthalmology/optometry visit for glaucoma screening and checkup Recommended yearly dental visit for hygiene and checkup  Vaccinations: Influenza vaccine: completed 04/08/2020, due 03/07/2021 Pneumococcal vaccine: due Tdap vaccine: due Shingles vaccine: discussed   Covid-19:  08/28/2020, 02/16/2020, 01/18/2020  Advanced directives: Advance directive discussed with you today.   Conditions/risks identified: none  Next appointment: Follow up in one year for your annual wellness visit.   Preventive Care 66 Years and Older, Male Preventive care refers to lifestyle choices and visits with your health care provider that can promote health and wellness. What does preventive care include? A yearly physical exam. This is also called an annual well check. Dental exams once or twice a year. Routine eye exams. Ask your health care provider how often you should have your eyes checked. Personal lifestyle choices, including: Daily care of your teeth and gums. Regular physical activity. Eating a healthy diet. Avoiding tobacco and drug use. Limiting alcohol use. Practicing safe sex. Taking low doses of aspirin every day. Taking vitamin and mineral supplements as recommended by your health care provider. What happens during an annual well check? The services and screenings done by your health care provider during your annual well check will depend on your age, overall health, lifestyle risk factors, and family history of disease. Counseling  Your health care provider may ask you questions about your: Alcohol use. Tobacco use. Drug use. Emotional  well-being. Home and relationship well-being. Sexual activity. Eating habits. History of falls. Memory and ability to understand (cognition). Work and work Statistician. Screening  You may have the following tests or measurements: Height, weight, and BMI. Blood pressure. Lipid and cholesterol levels. These may be checked every 5 years, or more frequently if you are over 59 years old. Skin check. Lung cancer screening. You may have this screening every year starting at age 66 if you have a 30-pack-year history of smoking and currently smoke or have quit within the past 15 years. Fecal occult blood test (FOBT) of the stool. You may have this test every year starting at age 66. Flexible sigmoidoscopy or colonoscopy. You may have a sigmoidoscopy every 5 years or a colonoscopy every 10 years starting at age 66. Prostate cancer screening. Recommendations will vary depending on your family history and other risks. Hepatitis C blood test. Hepatitis B blood test. Sexually transmitted disease (STD) testing. Diabetes screening. This is done by checking your blood sugar (glucose) after you have not eaten for a while (fasting). You may have this done every 1-3 years. Abdominal aortic aneurysm (AAA) screening. You may need this if you are a current or former smoker. Osteoporosis. You may be screened starting at age 66 if you are at high risk. Talk with your health care provider about your test results, treatment options, and if necessary, the need for more tests. Vaccines  Your health care provider may recommend certain vaccines, such as: Influenza vaccine. This is recommended every year. Tetanus, diphtheria, and acellular pertussis (Tdap, Td) vaccine. You may need a Td booster every 10 years. Zoster vaccine. You may need this after age 66. Pneumococcal 13-valent conjugate (PCV13) vaccine. One dose is recommended after age 66. Pneumococcal polysaccharide (PPSV23) vaccine. One dose is recommended  after  age 66. Talk to your health care provider about which screenings and vaccines you need and how often you need them. This information is not intended to replace advice given to you by your health care provider. Make sure you discuss any questions you have with your health care provider. Document Released: 08/20/2015 Document Revised: 04/12/2016 Document Reviewed: 05/25/2015 Elsevier Interactive Patient Education  2017 [employer] Prevention in the Home Falls can cause injuries. They can happen to people of all ages. There are many things you can do to make your home safe and to help prevent falls. What can I do on the outside of my home? Regularly fix the edges of walkways and driveways and fix any cracks. Remove anything that might make you trip as you walk through a door, such as a raised step or threshold. Trim any bushes or trees on the path to your home. Use bright outdoor lighting. Clear any walking paths of anything that might make someone trip, such as rocks or tools. Regularly check to see if handrails are loose or broken. Make sure that both sides of any steps have handrails. Any raised decks and porches should have guardrails on the edges. Have any leaves, snow, or ice cleared regularly. Use sand or salt on walking paths during winter. Clean up any spills in your garage right away. This includes oil or grease spills. What can I do in the bathroom? Use night lights. Install grab bars by the toilet and in the tub and shower. Do not use towel bars as grab bars. Use non-skid mats or decals in the tub or shower. If you need to sit down in the shower, use a plastic, non-slip stool. Keep the floor dry. Clean up any water that spills on the floor as soon as it happens. Remove soap buildup in the tub or shower regularly. Attach bath mats securely with double-sided non-slip rug tape. Do not have throw rugs and other things on the floor that can make you trip. What can I do in the  bedroom? Use night lights. Make sure that you have a light by your bed that is easy to reach. Do not use any sheets or blankets that are too big for your bed. They should not hang down onto the floor. Have a firm chair that has side arms. You can use this for support while you get dressed. Do not have throw rugs and other things on the floor that can make you trip. What can I do in the kitchen? Clean up any spills right away. Avoid walking on wet floors. Keep items that you use a lot in easy-to-reach places. If you need to reach something above you, use a strong step stool that has a grab bar. Keep electrical cords out of the way. Do not use floor polish or wax that makes floors slippery. If you must use wax, use non-skid floor wax. Do not have throw rugs and other things on the floor that can make you trip. What can I do with my stairs? Do not leave any items on the stairs. Make sure that there are handrails on both sides of the stairs and use them. Fix handrails that are broken or loose. Make sure that handrails are as long as the stairways. Check any carpeting to make sure that it is firmly attached to the stairs. Fix any carpet that is loose or worn. Avoid having throw rugs at the top or bottom of the stairs. If you  do have throw rugs, attach them to the floor with carpet tape. Make sure that you have a light switch at the top of the stairs and the bottom of the stairs. If you do not have them, ask someone to add them for you. What else can I do to help prevent falls? Wear shoes that: Do not have high heels. Have rubber bottoms. Are comfortable and fit you well. Are closed at the toe. Do not wear sandals. If you use a stepladder: Make sure that it is fully opened. Do not climb a closed stepladder. Make sure that both sides of the stepladder are locked into place. Ask someone to hold it for you, if possible. Clearly mark and make sure that you can see: Any grab bars or  handrails. First and last steps. Where the edge of each step is. Use tools that help you move around (mobility aids) if they are needed. These include: Canes. Walkers. Scooters. Crutches. Turn on the lights when you go into a dark area. Replace any light bulbs as soon as they burn out. Set up your furniture so you have a clear path. Avoid moving your furniture around. If any of your floors are uneven, fix them. If there are any pets around you, be aware of where they are. Review your medicines with your doctor. Some medicines can make you feel dizzy. This can increase your chance of falling. Ask your doctor what other things that you can do to help prevent falls. This information is not intended to replace advice given to you by your health care provider. Make sure you discuss any questions you have with your health care provider. Document Released: 05/20/2009 Document Revised: 12/30/2015 Document Reviewed: 08/28/2014 Elsevier Interactive Patient Education  2017 [employer].

## 2021-02-25 NOTE — Progress Notes (Signed)
I connected with Evan [name] today by telephone and verified that I am speaking with the correct person using two identifiers. Location patient: home Location provider: work Persons participating in the virtual visit: Squire, Withey LPN.   I discussed the limitations, risks, security and privacy concerns of performing an evaluation and management service by telephone and the availability of in person appointments. I also discussed with the patient that there may be a patient responsible charge related to this service. The patient expressed understanding and verbally consented to this telephonic visit.    Interactive audio and video telecommunications were attempted between this provider and patient, however failed, due to patient having technical difficulties OR patient did not have access to video capability.  We continued and completed visit with audio only.     Vital signs may be patient reported or missing.  Subjective:   Evan [name] is a 65 y.o. male who presents for Medicare Annual/Subsequent preventive examination.  Review of Systems     Cardiac Risk Factors include: advanced age (>78mn, >>54women);diabetes mellitus;hypertension;male gender;obesity (BMI >30kg/m2)     Objective:    Today's Vitals   02/25/21 1112  Weight: 220 lb (99.8 kg)  Height: 5' 4"  (1.626 m)   Body mass index is 37.76 kg/m.  Advanced Directives 02/25/2021 02/19/2021 02/11/2021 02/11/2021 01/15/2021 12/08/2020 11/26/2020  Does Patient Have a Medical Advance Directive? No No No No No No No  Type of Advance Directive - - - - - - -  Does patient want to make changes to medical advance directive? - - - - - - -  Copy of [place] Chart? - - - - - - -  Would patient like information on creating a medical advance directive? - - No - Patient declined No - Patient declined - - No - Patient declined    Current Medications (verified) Outpatient Encounter Medications as of  02/25/2021  Medication Sig   acetaminophen (TYLENOL) 500 MG tablet Take 500 mg by mouth every 6 (six) hours as needed.   aspirin 81 MG chewable tablet Chew 1 tablet (81 mg total) by mouth daily.   atorvastatin (LIPITOR) 80 MG tablet Take 1 tablet (80 mg total) by mouth daily.   Blood Glucose Monitoring Suppl (ONETOUCH VERIO) w/Device KIT Use to check blood sugar 3 to 4 times a day and document.  Please bring to visits for review.   budesonide-formoterol (SYMBICORT) 80-4.5 MCG/ACT inhaler Inhale 2 puffs into the lungs 2 (two) times daily.   Cholecalciferol 125 MCG (5000 UT) TABS Take 1 tablet (5,000 Units total) by mouth daily.   clindamycin (CLEOCIN) 300 MG capsule Take 1 capsule (300 mg total) by mouth 3 (three) times daily for 10 days.   Elastic Bandages & Supports (MEDICAL COMPRESSION STOCKINGS) MISC 1 Package by Does not apply route daily.   empagliflozin (JARDIANCE) 10 MG TABS tablet Take 1 tablet (10 mg total) by mouth daily.   ezetimibe (ZETIA) 10 MG tablet Take 1 tablet (10 mg total) by mouth daily.   furosemide (LASIX) 40 MG tablet TAKE 2 TABLETS BY MOUTH ONCE EVERY MORNING AND 1 TABLET ONCE EVERY EVENING   glucose blood (ONETOUCH VERIO) test strip Use to check blood sugar 3 to 4 times a day.   metoprolol succinate (TOPROL XL) 50 MG 24 hr tablet Take 1 tablet (50 mg total) by mouth daily.   MODERNA COVID-19 VACCINE 100 MCG/0.5ML injection    nitroGLYCERIN (NITROSTAT) 0.4 MG SL tablet Place  1 tablet (0.4 mg total) under the tongue every 5 (five) minutes as needed for chest pain.   olmesartan (BENICAR) 5 MG tablet Take 2 tablets (10 mg total) by mouth daily.   Omega-3 Fatty Acids (OMEGA 3 500 PO) Take 500 mg by mouth daily.   OneTouch Delica Lancets 75T MISC Use to check blood sugar 2-3 times a day.   pantoprazole (PROTONIX) 40 MG tablet Take 1 tablet (40 mg total) by mouth daily.   risperiDONE (RISPERDAL) 0.5 MG tablet Take 1 tablet (0.5 mg total) by mouth 2 (two) times daily.    rivaroxaban (XARELTO) 20 MG TABS tablet Take 1 tablet (20 mg total) by mouth at bedtime.   sharps container 1 each by Does not apply route as needed.   Skin Protectants, Misc. (EUCERIN) cream Apply topically as needed for dry skin.   triamcinolone cream (KENALOG) 0.1 % APPLY TO AFFECTED AREA(s) TWICE DAILY   vitamin B-12 (CYANOCOBALAMIN) 1000 MCG tablet Take 1 tablet (1,000 mcg total) by mouth daily.   No facility-administered encounter medications on file as of 02/25/2021.    Allergies (verified) Penicillin g, Sulfa antibiotics, Tiotropium, and Zoloft [sertraline hcl]   History: Past Medical History:  Diagnosis Date   Anginal pain ([place])    Anxiety disorder    Asthma    Atresia of esophagus without fistula    CAD (coronary artery disease)    Cellulitis    CHF (congestive heart failure) (HCC)    NYHA CLASS III,CHRONIC,DIASTOLIC   COPD (chronic obstructive pulmonary disease) ([place])    COVID-19    Diabetes mellitus without complication (HCC)    Edema    RIGHT LOWER LEG   Gastroesophageal reflux    H/O: GI bleed    History of pneumonia    Remote   History of scarlet fever    Childhood   Hyperlipidemia    Hypertension    Myocardial infarction ([place]) 2009   Obesity    Obstructive sleep apnea    Pain    CHRONIC BACK / ABDOMINAL   Panic disorder    Peripheral venous insufficiency    PTSD (post-traumatic stress disorder)    Retinopathy    DIABETIC   Stasis, venous    Stroke [facility])    Vertigo    Past Surgical History:  Procedure Laterality Date   CARDIAC CATHETERIZATION     CATARACT EXTRACTION Left    CATARACT EXTRACTION W/PHACO Right 05/03/2017   Procedure: CATARACT EXTRACTION PHACO AND INTRAOCULAR LENS PLACEMENT (IOC);  Surgeon: Leandrew Koyanagi, MD;  Location: [facility] ORS;  Service: Ophthalmology;  Laterality: Right;  [place] 00:35.3 AP% 12.3 CDE 4.33 Fluid Pack lot # [id number]        COLONOSCOPY WITH PROPOFOL N/A 11/26/2020   Procedure: COLONOSCOPY WITH PROPOFOL;   Surgeon: Jonathon Bellows, MD;  Location[facility] ENDOSCOPY;  Service: Gastroenterology;  Laterality: N/A;  ANNETTE TO PICK UP [phone]   CORONARY ANGIOPLASTY WITH STENT PLACEMENT  2002   CORONARY ANGIOPLASTY WITH STENT PLACEMENT  1999   CORONARY ARTERY BYPASS GRAFT     x7   ESOPHAGOGASTRODUODENOSCOPY N/A 09/19/2016   Procedure: ESOPHAGOGASTRODUODENOSCOPY (EGD);  Surgeon: Lollie Sails, MD;  Location: [facility] ENDOSCOPY;  Service: Endoscopy;  Laterality: N/A;   ESOPHAGOGASTRODUODENOSCOPY (EGD) WITH PROPOFOL  11/26/2020   Procedure: ESOPHAGOGASTRODUODENOSCOPY (EGD) WITH PROPOFOL;  Surgeon: Jonathon Bellows, MD;  Location: [facility] ENDOSCOPY;  Service: Gastroenterology;;   Family History  Problem Relation Age of Onset   Heart attack Mother    Hypertension Mother  Hyperlipidemia Mother    Heart attack Brother 39       MI   Coronary artery disease Other    Social History   Socioeconomic History   Marital status: Single    Spouse name: Not on file   Number of children: Not on file   Years of education: Not on file   Highest education level: Not on file  Occupational History    Comment: disabled  Tobacco Use   Smoking status: Never   Smokeless tobacco: Never  Vaping Use   Vaping Use: Never used  Substance and Sexual Activity   Alcohol use: No   Drug use: No   Sexual activity: Not Currently  Other Topics Concern   Not on file  Social History Narrative   Disabled   Single   Social Determinants of Health   Financial Resource Strain: Low Risk    Difficulty of Paying Living Expenses: Not hard at all  Food Insecurity: No Food Insecurity   Worried About Charity fundraiser in the Last Year: Never true   [place] in the Last Year: Never true  Transportation Needs: No Transportation Needs   Lack of Transportation (Medical): No   Lack of Transportation (Non-Medical): No  Physical Activity: Sufficiently Active   Days of Exercise per Week: 7 days   Minutes of Exercise per Session: 30  min  Stress: No Stress Concern Present   Feeling of Stress : Not at all  Social Connections: Not on file    Tobacco Counseling Counseling given: Not Answered   Clinical Intake:  Pre-visit preparation completed: Yes  Pain : No/denies pain     Nutritional Status: BMI > 30  Obese Nutritional Risks: None Diabetes: Yes  How often do you need to have someone help you when you read instructions, pamphlets, or other written materials from your doctor or pharmacy?: 1 - Never  Diabetic? Yes Nutrition Risk Assessment:  Has the patient had any N/V/D within the last 2 months?  No  Does the patient have any non-healing wounds?  No  Has the patient had any unintentional weight loss or weight gain?  No   Diabetes:  Is the patient diabetic?  Yes  If diabetic, was a CBG obtained today?  No  Did the patient bring in their glucometer from home?  No  How often do you monitor your CBG's? daily.   Financial Strains and Diabetes Management:  Are you having any financial strains with the device, your supplies or your medication? No .  Does the patient want to be seen by Chronic Care Management for management of their diabetes?  No  Would the patient like to be referred to a Nutritionist or for Diabetic Management?  No   Diabetic Exams:  Diabetic Eye Exam: Completed 10/27/2020 Diabetic Foot Exam: Completed 10/06/2020   Interpreter Needed?: No  Information entered by :: NAllen LPN   Activities of Daily Living In your present state of health, do you have any difficulty performing the following activities: 02/25/2021 11/06/2020  Hearing? N N  Vision? Y N  Comment floaters at times -  Difficulty concentrating or making decisions? N N  Walking or climbing stairs? Y N  Dressing or bathing? N N  Doing errands, shopping? N Y  Conservation officer, nature and eating ? N -  Using the Toilet? N -  In the past six months, have you accidently leaked urine? N -  Do you have problems with loss of bowel  control? N -  Managing your Medications? N -  Managing your Finances? N -  Housekeeping or managing your Housekeeping? N -  Some recent data might be hidden    Patient Care Team: [name], NP as PCP - General (Nurse Practitioner) Minna Merritts, MD as PCP - Cardiology (Cardiology) Alisa Graff, FNP as Nurse Practitioner (Cardiology) Minna Merritts, MD as Consulting Physician (Cardiology) [name], RN as Case Manager ([place]) [name], [facility] as Pharmacist (Pharmacist) Rebekah Chesterfield, LCSW as Social Worker (Licensed Clinical Social Worker)  Indicate any recent Medical Services you may have received from other than [facility] providers in the past year (date may be approximate).     Assessment:   This is a routine wellness examination for [place].  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 0 1 1 0 0  Comment - - passed out - -  Number falls in past yr: - 0 0 0 0  Injury with Fall? - 0 0 0 0  Risk for fall due to : Impaired balance/gait;Medication side effect History of fall(s) Medication side effect;History of fall(s);Impaired balance/gait - Impaired balance/gait  Follow up Falls evaluation completed;Education provided;Falls prevention discussed Falls evaluation completed Falls evaluation  completed;Education provided;Falls prevention discussed Falls evaluation completed Falls evaluation completed    FALL RISK PREVENTION PERTAINING TO THE HOME:  Any stairs in or around the home? No  If so, are there any without handrails?  N/a Home free of loose throw rugs in walkways, pet beds, electrical cords, etc? Yes  Adequate lighting in your home to reduce risk of falls? Yes   ASSISTIVE DEVICES UTILIZED TO PREVENT FALLS:  Life alert? No  Use of a cane, walker or w/c? Yes  Grab bars in the bathroom? Yes  Shower chair or bench in shower? Yes  Elevated toilet seat or a handicapped toilet? Yes   TIMED UP AND GO:  Was the test performed? No .      Cognitive Function:        Immunizations Immunization History  Administered Date(s) Administered   Fluad Quad(high Dose 65+) 04/08/2020   Moderna Sars-Covid-2 Vaccination 01/18/2020, 02/16/2020, 08/28/2020    TDAP status: Due, Education has been provided regarding the importance of this vaccine. Advised may receive this vaccine at local pharmacy or Health Dept. Aware to provide a copy of the vaccination record if obtained from local pharmacy or Health Dept. Verbalized acceptance and understanding.  Flu Vaccine status: Up to date  Pneumococcal vaccine status: Due, Education has been provided regarding the importance of this vaccine. Advised may receive this vaccine at local pharmacy or Health Dept. Aware to provide a copy of the vaccination record if obtained from local pharmacy or  Health Dept. Verbalized acceptance and understanding.  Covid-19 vaccine status: Completed vaccines  Qualifies for Shingles Vaccine? Yes   Zostavax completed No   Shingrix Completed?: No.    Education has been provided regarding the importance of this vaccine. Patient has been advised to call insurance company to determine out of pocket expense if they have not yet received this vaccine. Advised may also receive vaccine at local pharmacy or Health  Dept. Verbalized acceptance and understanding.  Screening Tests Health Maintenance  Topic Date Due   COVID-19 Vaccine (4 - Booster for Moderna series) 11/26/2020   Zoster Vaccines- Shingrix (1 of 2) 05/11/2021 (Originally 09/10/1974)   TETANUS/TDAP  10/06/2021 (Originally 09/10/1974)   PNA vac Low Risk Adult (1 of 2 - PCV13) 02/08/2022 (Originally 09/11/2019)   INFLUENZA VACCINE  03/07/2021   HEMOGLOBIN A1C  08/11/2021   FOOT EXAM  10/06/2021   OPHTHALMOLOGY EXAM  10/27/2021   COLONOSCOPY (Pts 45-9yr Insurance coverage will need to be confirmed)  11/27/2023   Hepatitis C Screening  Completed   HPV VACCINES  Aged Out    Health Maintenance  Health Maintenance Due  Topic Date Due   COVID-19 Vaccine (4 - Booster for Moderna series) 11/26/2020    Colorectal cancer screening: Type of screening: Colonoscopy. Completed 11/26/2020. Repeat every 3 years  Lung Cancer Screening: (Low Dose CT Chest recommended if Age 118-80years, 30 pack-year currently smoking OR have quit w/in 15years.) does not qualify.   Lung Cancer Screening Referral: no  Additional Screening:  Hepatitis C Screening: does qualify; Completed 10/06/2020  Vision Screening: Recommended annual ophthalmology exams for early detection of glaucoma and other disorders of the eye. Is the patient up to date with their annual eye exam?  Yes  Who is the provider or what is the name of the office in which the patient attends annual eye exams? [facility] not [facility] with a provider, would they like to be referred to a provider to establish care? No .   Dental Screening: Recommended annual dental exams for proper oral hygiene  Community Resource Referral / Chronic Care Management: CRR required this visit?  No   CCM required this visit?  No      Plan:     I have personally reviewed and noted the following in the patient's chart:   Medical and social history Use of alcohol, tobacco or illicit drugs   Current medications and supplements including opioid prescriptions. Patient is not currently taking opioid prescriptions. Functional ability and status Nutritional status Physical activity Advanced directives List of other physicians Hospitalizations, surgeries, and ER visits in previous 12 months Vitals Screenings to include cognitive, depression, and falls Referrals and appointments  In addition, I have reviewed and discussed with patient certain preventive protocols, quality metrics, and best practice recommendations. A written personalized care plan for preventive services as well as general preventive health recommendations were provided to patient.     NKellie Simmering LPN   72/83/1517  Nurse Notes: 6 CIT not administered patient refused.

## 2021-02-28 ENCOUNTER — Telehealth: Payer: Self-pay

## 2021-02-28 NOTE — Telephone Encounter (Signed)
Copied from [place] [id number]. Topic: General - Other >> Feb 28, 2021 10:56 AM Leward Quan A wrote: Reason for CRM: Patient called in to inform [name] that he have spoken to the care giver and will keep her. Also has 2 assessments coming up in August  Ph# [phone]

## 2021-02-28 NOTE — Patient Instructions (Signed)
Visit Information   Goals Addressed               This Visit's Progress     Patient Stated     SW-"I need more help." (pt-stated)   On track     Patient Goals/Self-Care Activities: Over the next 120 days Attend all scheduled appointments with providers Contact office with any questions or concerns [facility] transportation number for upcoming Vascular appt - learn and use visualization or guided imagery - practice relaxation or meditation daily - practice positive thinking and self-talk I will coordinate with your doctor to assist with obtaining a new in-home aid      Other     SW-Track and Manage My Symptoms-Depression   On track     Timeframe:  Long-Range Goal Priority:  High Start Date:   08/09/20                         Expected End Date: 04/06/21                    Follow Up Date- 03/17/21  Patient Goals/Self-Care Activities: Over the next 120 days Attend all scheduled appointments with providers Contact office with any questions or concerns [facility] transportation number for upcoming Vascular appt - learn and use visualization or guided imagery - practice relaxation or meditation daily - practice positive thinking and self-talk I will coordinate with your doctor to assist with obtaining a new in-home aid        Patient verbalizes understanding of instructions provided today and agrees to view in [place].   Telephone follow up appointment with care management team member scheduled for:03/07/21  [name], MSW, [email or web address] Phone [phone] 10:39 AM
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
## 2021-02-28 NOTE — Telephone Encounter (Signed)
Noted  

## 2021-03-01 ENCOUNTER — Telehealth: Payer: Self-pay | Admitting: General Practice

## 2021-03-01 ENCOUNTER — Ambulatory Visit: Payer: Self-pay | Admitting: General Practice

## 2021-03-01 DIAGNOSIS — I152 Hypertension secondary to endocrine disorders: Secondary | ICD-10-CM | POA: Diagnosis not present

## 2021-03-01 DIAGNOSIS — E1159 Type 2 diabetes mellitus with other circulatory complications: Secondary | ICD-10-CM

## 2021-03-01 DIAGNOSIS — F339 Major depressive disorder, recurrent, unspecified: Secondary | ICD-10-CM

## 2021-03-01 DIAGNOSIS — E1169 Type 2 diabetes mellitus with other specified complication: Secondary | ICD-10-CM | POA: Diagnosis not present

## 2021-03-01 DIAGNOSIS — I25118 Atherosclerotic heart disease of native coronary artery with other forms of angina pectoris: Secondary | ICD-10-CM

## 2021-03-01 DIAGNOSIS — I5032 Chronic diastolic (congestive) heart failure: Secondary | ICD-10-CM | POA: Diagnosis not present

## 2021-03-01 DIAGNOSIS — Z8673 Personal history of transient ischemic attack (TIA), and cerebral infarction without residual deficits: Secondary | ICD-10-CM

## 2021-03-01 DIAGNOSIS — E785 Hyperlipidemia, unspecified: Secondary | ICD-10-CM | POA: Diagnosis not present

## 2021-03-01 DIAGNOSIS — F419 Anxiety disorder, unspecified: Secondary | ICD-10-CM

## 2021-03-01 NOTE — Chronic Care Management (AMB) (Signed)
Chronic Care Management   CCM RN Visit Note  03/01/2021 Name: Evan [name] MRN: [id number] DOB: Apr 29, 1956  Subjective: Evan [name] is a 65 y.o. year old male who is a primary care patient of [name], Henrine Screws T, NP. The care management team was consulted for assistance with disease management and care coordination needs.    Engaged with patient by telephone for follow up visit in response to provider referral for case management and/or care coordination services.   Consent to Services:  The patient was given information about Chronic Care Management services, agreed to services, and gave verbal consent prior to initiation of services.  Please see initial visit note for detailed documentation.   Patient agreed to services and verbal consent obtained.   Assessment: Review of patient past medical history, allergies, medications, health status, including review of consultants reports, laboratory and other test data, was performed as part of comprehensive evaluation and provision of chronic care management services.   SDOH (Social Determinants of Health) assessments and interventions performed:    CCM Care Plan  Allergies  Allergen Reactions   Penicillin G Hives   Sulfa Antibiotics Hives   Tiotropium    Zoloft [Sertraline Hcl] Other (See Comments)    Outpatient Encounter Medications as of 03/01/2021  Medication Sig   acetaminophen (TYLENOL) 500 MG tablet Take 500 mg by mouth every 6 (six) hours as needed.   aspirin 81 MG chewable tablet Chew 1 tablet (81 mg total) by mouth daily.   atorvastatin (LIPITOR) 80 MG tablet Take 1 tablet (80 mg total) by mouth daily.   Blood Glucose Monitoring Suppl (ONETOUCH VERIO) w/Device KIT Use to check blood sugar 3 to 4 times a day and document.  Please bring to visits for review.   budesonide-formoterol (SYMBICORT) 80-4.5 MCG/ACT inhaler Inhale 2 puffs into the lungs 2 (two) times daily.   Cholecalciferol 125 MCG (5000 UT) TABS Take 1 tablet  (5,000 Units total) by mouth daily.   clindamycin (CLEOCIN) 300 MG capsule Take 1 capsule (300 mg total) by mouth 3 (three) times daily for 10 days.   Elastic Bandages & Supports (MEDICAL COMPRESSION STOCKINGS) MISC 1 Package by Does not apply route daily.   empagliflozin (JARDIANCE) 10 MG TABS tablet Take 1 tablet (10 mg total) by mouth daily.   ezetimibe (ZETIA) 10 MG tablet Take 1 tablet (10 mg total) by mouth daily.   furosemide (LASIX) 40 MG tablet TAKE 2 TABLETS BY MOUTH ONCE EVERY MORNING AND 1 TABLET ONCE EVERY EVENING   glucose blood (ONETOUCH VERIO) test strip Use to check blood sugar 3 to 4 times a day.   metoprolol succinate (TOPROL XL) 50 MG 24 hr tablet Take 1 tablet (50 mg total) by mouth daily.   MODERNA COVID-19 VACCINE 100 MCG/0.5ML injection    nitroGLYCERIN (NITROSTAT) 0.4 MG SL tablet Place 1 tablet (0.4 mg total) under the tongue every 5 (five) minutes as needed for chest pain.   olmesartan (BENICAR) 5 MG tablet Take 2 tablets (10 mg total) by mouth daily.   Omega-3 Fatty Acids (OMEGA 3 500 PO) Take 500 mg by mouth daily.   OneTouch Delica Lancets 00F MISC Use to check blood sugar 2-3 times a day.   pantoprazole (PROTONIX) 40 MG tablet Take 1 tablet (40 mg total) by mouth daily.   risperiDONE (RISPERDAL) 0.5 MG tablet Take 1 tablet (0.5 mg total) by mouth 2 (two) times daily.   rivaroxaban (XARELTO) 20 MG TABS tablet Take 1 tablet (20 mg total)  by mouth at bedtime.   sharps container 1 each by Does not apply route as needed.   Skin Protectants, Misc. (EUCERIN) cream Apply topically as needed for dry skin.   triamcinolone cream (KENALOG) 0.1 % APPLY TO AFFECTED AREA(s) TWICE DAILY   vitamin B-12 (CYANOCOBALAMIN) 1000 MCG tablet Take 1 tablet (1,000 mcg total) by mouth daily.   No facility-administered encounter medications on file as of 03/01/2021.    Patient Active Problem List   Diagnosis Date Noted   Other thrombophilia ([place]) 12/25/2020   Vitamin D deficiency  12/04/2020   Vitamin B12 deficiency 12/04/2020   Stenosis of right carotid artery 12/03/2020   History of CVA (cerebrovascular accident) 11/06/2020   History of 2019 novel coronavirus disease (COVID-19) 09/27/2020   Atherosclerosis of aorta ([place]) 12/04/2019   Persistent atrial fibrillation ([place]) 09/12/2019   CAD (coronary artery disease) 09/12/2019   Chronic venous stasis 09/12/2019   Hoarding disorder 09/12/2019   Cervical spinal stenosis 09/12/2019   Diabetic retinopathy ([place]) 09/12/2019   COPD, mild ([place]) 09/12/2019   Osteoporosis 09/09/2019   History of prostate cancer 09/09/2019   Anxiety 08/10/2019   Obstructive sleep apnea on CPAP 08/10/2019   Hyperlipidemia associated with type 2 diabetes mellitus ([place]) 08/10/2019   Chronic diastolic CHF (congestive heart failure) ([place]) 01/09/2014   Esophageal dysmotility 09/12/2013   Type 2 diabetes mellitus with morbid obesity ([place]) 02/14/2012   Obesity, morbid (more than 100 lbs over ideal weight or BMI > 40) ([place]) 07/26/2011   OLD MYOCARDIAL INFARCTION 03/02/2010   Hypertension associated with diabetes ([place]) 03/20/2009   CORONARY ATHEROSCLEROSIS, ARTERY BYPASS GRAFT 03/20/2009    Conditions to be addressed/monitored:CAD, HTN, HLD, Anxiety, Depression, and History of strokes  Care Plan : RNCM: Coronary Artery Disease (Adult) and HLD  Updates made by [name] since 03/01/2021 12:00 AM     Problem: RNCM: Disease Progression (Coronary Artery Disease), HLD and history of stroke with residual effects   Priority: Medium     Long-Range Goal: RNCM: Disease Progression Prevented or Minimized: CAD/HLD/ History of stroke with residual effects   Start Date: 10/15/2020  Expected End Date: 12/26/2021  This Visit's Progress: On track  Priority: Medium  Note:   Current Barriers:  Poorly controlled hyperlipidemia, complicated by anxiety, HF, HTN, past history of a stroke Current antihyperlipidemic regimen: Crestor 40 mg QD, Zetia 10 mg  QD Most recent lipid panel:     Component Value Date/Time   CHOL 256 (H) 10/06/2020 1153   CHOL 194 12/30/2013 0413   TRIG 139 10/06/2020 1153   TRIG 158 12/30/2013 0413   HDL 57 10/06/2020 1153   HDL 42 12/30/2013 0413   CHOLHDL 4 09/12/2013 1035   VLDL 32 12/30/2013 0413   LDLCALC 174 (H) 10/06/2020 1153   LDLCALC 120 (H) 12/30/2013 0413   LDLDIRECT 178.5 07/26/2011 1234   ASCVD risk enhancing conditions: age >73, DM, HTN, CHF, past history of a stroke Unable to independently manage HLD, CAD, and history of stroke with residual effects Lacks social connections Does not maintain contact with provider office Does not contact provider office for questions/concerns RN Care Manager Clinical Goal(s):  patient will work with Consulting civil engineer, providers, and care team towards execution of optimized self-health management plan patient will verbalize understanding of plan for effective management of HLD, CAD, and history of strokes  patient will work with [facility] and pcp  to address needs related to HLD,  CAD, and history of strokes  patient will attend all  scheduled medical appointments: 05-11-2021 at 11 am, recommended the patient come to see the pcp sooner as he has had x2 visits to the ER in July. Interventions: Collaboration with [name], NP regarding development and update of comprehensive plan of care as evidenced by provider attestation and co-signature Inter-disciplinary care team collaboration (see longitudinal plan of care) Medication review performed; medication list updated in electronic medical record.  Inter-disciplinary care team collaboration (see longitudinal plan of care) Referred to pharmacy team for assistance with HLD, CAD, and history of stroke  medication management Evaluation of current treatment plan related to HLD, CAD, history of stroke and patient's adherence to plan as established by provider. 01-04-2021: The patient was in the ER on 12-08-2020 for dehydration.  States he feels it is because of the colonoscopy prep he had to do and having to have it done 3 times before completion. The patient blames a lot of his issues on his former caregiver "Annette".  Had to redirect the patient several times during the call. Education on writing down questions to ask at the pcp and cardiology appointments. 03-01-2021: the patient has had 2 visits to the ER in July for palpitations and he says it is due to the stress he is under at his home with people coming and eviction notices. He has a new caregiver Enid Derry but it could not be determined how often Enid Derry comes. Redirection of conversation has to be done several times during the call. The patient states he has seen the cardiologist since he went to the ER but there is no record found and he can not give any information from follow up. Recommended he come in to see the pcp for follow up.  Advised patient to call the office for changes in conditions or questions  Provided education to patient re: heart healthy diet and weight loss. 01-04-2021: The patients states weight is 224 today. States he is not having issues with edema or swelling. States he is staying hydrated and eating well. Does say he gets easily short of breath with activity and can not do things like he normally does. Education on pacing activity and doing things in short time blocks then resting before resuming activity. 03-01-2021: The patient denies any swelling in his feet or legs but states he has bleeding from his legs due to "veins busting". States that he is fearful of having another stroke due to the stress he is under. The patient easily gets agitated and has to be redirected. Offered solutions to his issues but the patient continues to revert back to agitation and saying nobody wants to help him. CCM team has been active in his care, including the care guide team providing resources for transportation and other resources in [place].  Reviewed medications  with patient and discussed compliance. The patient has started taking Zetia 10 mg daily to his Crestor 40 mg.  The patient verbalized understanding. 03-01-2021: States compliance with medications Reviewed scheduled/upcoming provider appointments including: 05-11-2021 at 11 am Discussed plans with patient for ongoing care management follow up and provided patient with direct contact information for care management team Patient Goals/Self-Care Activities: - call for medicine refill 2 or 3 days before it runs out - call if I am sick and can't take my medicine - keep a list of all the medicines I take; vitamins and herbals too - learn to read medicine labels - use a pillbox to sort medicine - use an alarm clock or phone to remind me to  take my medicine - change to whole grain breads, cereal, pasta - drink 6 to 8 glasses of water each day - eat 3 to 5 servings of fruits and vegetables each day - eat 5 or 6 small meals each day - fill half the plate with nonstarchy vegetables - limit fast food meals to no more than 1 per week - manage portion size - prepare main meal at home 3 to 5 days each week - read food labels for fat, fiber, carbohydrates and portion size - set a realistic goal - be open to making changes - I can manage, know and watch for signs of a heart attack - if I have chest pain, call for help - learn about small changes that will make a big difference - learn my personal risk factors - barriers to treatment adherence reviewed and addressed - difficulty of making life-long changes acknowledged - functional limitation screening reviewed - healthy lifestyle promoted - medication-adherence assessment completed - medication side effects managed - rescue (action) plan developed - response to pharmacologic therapy monitored - self-awareness of signs/symptoms of worsening disease encouraged Follow Up Plan: Telephone follow up appointment with care management team member scheduled for:  05-03-2021 at 0945am      Task: RNCM: Alleviate Barriers to Coronary Artery Disease Therapy Completed 03/01/2021  Outcome: Positive  Note:   Care Management Activities:    - barriers to treatment adherence reviewed and addressed - difficulty of making life-long changes acknowledged - functional limitation screening reviewed - healthy lifestyle promoted - medication-adherence assessment completed - medication side effects managed - rescue (action) plan developed - response to pharmacologic therapy monitored - self-awareness of signs/symptoms of worsening disease encouraged        Care Plan : RNCM: Hypertension (Adult)  Updates made by [name] since 03/01/2021 12:00 AM     Problem: RNCM: Hypertension (Hypertension)   Priority: Medium     Long-Range Goal: RNCM: Hypertension Monitored   Start Date: 10/15/2020  Expected End Date: 02/24/2022  This Visit's Progress: On track  Priority: Medium  Note:   Objective:  Last practice recorded BP readings:  BP Readings from Last 3 Encounters:  02/19/21 (!) 172/87  02/11/21 (!) 163/98  02/08/21 132/78   Most recent eGFR/CrCl:  Lab Results  Component Value Date   EGFR 76 02/08/2021    No components found for: CRCL Current Barriers:  Knowledge Deficits related to basic understanding of hypertension pathophysiology and self care management Knowledge Deficits related to understanding of medications prescribed for management of hypertension Non-adherence to prescribed medication regimen Non-adherence to scheduled provider appointments Transportation barriers Cognitive Deficits Limited Social Designer, multimedia.  Unable to independently manage HTN and other chronic conditions  Unable to self administer medications as prescribed Does not adhere to provider recommendations re:  Does not attend all scheduled provider appointments Does not adhere to prescribed medication regimen Does not adhere to prescribed  psychotropic medication regimen Lacks social connections Unable to perform IADLs independently Does not maintain contact with provider office Does not contact provider office for questions/concerns Case Manager Clinical Goal(s):  patient will verbalize understanding of plan for hypertension management patient will attend all scheduled medical appointments: 05-11-2021, recommended earlier appointment due to ER visits x 2 in July patient will demonstrate improved adherence to prescribed treatment plan for hypertension as evidenced by taking all medications as prescribed, monitoring and recording blood pressure as directed, adhering to low sodium/DASH diet patient will demonstrate improved health management independence as evidenced by  checking blood pressure as directed and notifying PCP if SBP>160 or DBP > 90, taking all medications as prescribe, and adhering to a low sodium diet as discussed. patient will verbalize basic understanding of hypertension disease process and self health management plan as evidenced by compliance with medications, compliance  with heart heatlhy/ADA diet, and working with the CCM team to manage health and well being.  Interventions:  Collaboration with [name], NP regarding development and update of comprehensive plan of care as evidenced by provider attestation and co-signature Inter-disciplinary care team collaboration (see longitudinal plan of care) UNABLE to independently:HTN and other chronic health conditions.  Evaluation of current treatment plan related to hypertension self management and patient's adherence to plan as established by provider. 03-01-2021: The patient has had 2 ER visits in July due to palpitations and "bleeding" from his legs and feet. The patient states he has been upset over his living arrangements and his landlord trying to evict him. He states he is compliant with what they have asking him to do. This has been an ongoing event for several  months.  The patient has had a stroke in the past and states he is fearful of having another stroke. Education on remaining calm and working through the things that upset him. Patient states he has seen the cardiologist but can not tell RNCM the outcome of the appointment and no notes found from cardiologist. Long standing history with this patient with non-compliance. Recommended that he see pcp for follow up before 05-11-2021.  Provided education to patient re: stroke prevention, s/s of heart attack and stroke, DASH diet, complications of uncontrolled blood pressure Reviewed medications with patient and discussed importance of compliance. 03-01-2021: Patient states compliance with medications  Discussed plans with patient for ongoing care management follow up and provided patient with direct contact information for care management team Advised patient, providing education and rationale, to monitor blood pressure daily and record, calling PCP for findings outside established parameters.  Reviewed scheduled/upcoming provider appointments including: 05-11-2021, next scheduled appointment.  Recommended the patient see the pcp sooner due to ER visits x 2 in July. The patient is receptive to office calling and assisting with a follow up post ER visit with pcp. Will collaborate with the front office staff for assisting patient with a follow up post ER with pcp Self-Care Activities: - Self administers medications as prescribed Attends all scheduled provider appointments Calls provider office for new concerns, questions, or BP outside discussed parameters Checks BP and records as discussed Follows a low sodium diet/DASH diet Patient Goals: - check blood pressure weekly - choose a place to take my blood pressure (home, clinic or office, retail store) - write blood pressure results in a log or diary - agree on reward when goals are met - agree to work together to make changes - ask questions to understand -  have a family meeting to talk about healthy habits - learn about high blood pressure  Follow Up Plan: Telephone follow up appointment with care management team member scheduled for: 05-03-2021 at 0945 am    Task: RNCM: Identify and Monitor Blood Pressure Elevation Completed 03/01/2021  Outcome: Positive  Note:   Care Management Activities:    - blood pressure equipment and technique reviewed - blood pressure trends reviewed - depression screen reviewed - home or ambulatory blood pressure monitoring encouraged        Care Plan : RNCM: Anxiety and depression  Updates made by [name] since 03/01/2021  12:00 AM     Problem: RNCM: Anxiety and depression   Priority: Medium     Long-Range Goal: RNCM: Anxiety and depression   Start Date: 10/15/2020  Expected End Date: 02/24/2022  This Visit's Progress: On track  Priority: Medium  Note:   Current Barriers:  Knowledge Deficits related to resources for anxiety/depression and meeting the patients needs  Chronic Disease Management support and education needs related to effective management of anxiety and depression Lacks caregiver support. 01-04-2021: The patient is requesting a new caregiver. The patient states he is working with a Hotel manager" and talking to [employer]. Will collaborate with the LCSW concerning patient needs. 03-01-2021: Has a new caregiver- Enid Derry who is working well for him at this time.  Lacks social connections Does not maintain contact with provider office Does not contact provider office for questions/concerns Frequent visits to the ER  Nurse Case Manager Clinical Goal(s):  patient will verbalize understanding of plan for effective management of anxiety patient will work with [facility], CCM team and pcp  to address needs related to effective management of anxiety  patient will attend all scheduled medical appointments: 05-11-2021 at 11 am, recommended that the patient come in sooner to see the pcp for follow up and  recommendations.   Interventions:  1:1 collaboration with [name], NP regarding development and update of comprehensive plan of care as evidenced by provider attestation and co-signature Inter-disciplinary care team collaboration (see longitudinal plan of care) Evaluation of current treatment plan related to anxiety  and patient's adherence to plan as established by provider. 01-04-2021: The patient is anxious about past caregiver and his living arrangements. He was at a friends house that lives near him. He states that he is waiting for Scottie to get him a new caregiver. Has talked with Jasmine the LCSW and also working with her. Empathetic listening and support give. Encouraged the patient to discuss his feelings with his support system and new social worker that comes in home. Education on keeping appointments and following recommendations of the pcp and other providers. 03-01-2021: The patient was very upset today and states that all his problems steam from the constant things he has to deal with as his landlord is constantly trying to get him evicted. He has been through several care givers. He has long history of agitation, stress, anxiety, and depression impacting his care. CCM team has diligently worked with the patient and given him mulitple resoures to help him with managing his care. Collaboration with [name], LCSW who has talked to the patient this month already. Will continue to monitor.  Advised patient to call office for changes in mood/anxiety/depression or questions  Provided education to patient re: anxiety and divisional activities, working with the CCM team for resources to help with managing anxiety.  Reviewed medications with patient and discussed compliance. 03-01-2021: The patient states that he is taking his medications as directed and is compliant.  Reviewed scheduled/upcoming provider appointments including: 05-11-2021 at 11 am Discussed plans with patient for  ongoing care management follow up and provided patient with direct contact information for care management team  Patient Goals/Self-Care Activities Over the next 120 days, patient will:  - Patient will self administer medications as prescribed Patient will attend all scheduled provider appointments Patient will call pharmacy for medication refills Patient will attend church or other social activities Patient will continue to perform ADL's independently Patient will continue to perform IADL's independently Patient will call provider office for new concerns or questions  Patient will work with BSW to address care coordination needs and will continue to work with the clinical team to address health care and disease management related needs.   - calendar and clock use encouraged - cognitive-stimulating activities promoted - consistent daily routine encouraged - extra time for response allowed- 03-01-2021: Encouraged the patient to write down questions to ask at his next appointment. The patient easily gets off track with is thinking. Discussed ways to remember appointments  - medication list reviewed - memory aid use encouraged - regular activity or exercise promoted - repetition utilized - social relationships promoted - written communication utilized  Follow Up Plan: Telephone follow up appointment with care management team member scheduled for: 05-03-2021 at 0945am        Task: RNCM: Identify and Optimize Mental Processes Completed 03/01/2021  Outcome: Positive  Note:   Care Management Activities:    - calendar and clock use encouraged - cognitive-stimulating activities promoted - consistent daily routine encouraged - extra time for response allowed - medication list reviewed - memory aid use encouraged - regular activity or exercise promoted - repetition utilized - social relationships promoted - written communication utilized         Plan:Telephone follow up appointment  with care management team member scheduled for:  05-03-2021 at [place] am  Noreene Larsson RN, MSN, [facility] Mobile: [phone]

## 2021-03-01 NOTE — Patient Instructions (Signed)
Visit Information  PATIENT GOALS:  Goals Addressed             This Visit's Progress    RNCM: Make and Keep All Appointments       Timeframe:  Long-Range Goal Priority:  High Start Date:     10-15-2020                        Expected End Date:        01-04-2022               Follow Up Date 05-03-2021   - arrange a ride through an agency 1 week before appointment - ask family or friend for a ride - call to cancel if needed - keep a calendar with prescription refill dates - keep a calendar with appointment dates - learn the bus route - use public transportation    Why is this important?   Part of staying healthy is seeing the doctor for follow-up care.  If you forget your appointments, there are some things you can do to stay on track.    Notes: Has a colonoscopy for 10-22-2020 and podiatry appointment 10-26-2020. 01-04-2021: The patient reminded of his appointment with pcp on 01-07-2021 at 11 am.  The patient states he has transportation. 03-01-2021: The patient has been to the ER 2 times in July due to palpitations. States its because of his anxiety and stress he is under at his place he lives. Discussed keeping appointments and following recommendations from his providers.         Patient verbalizes understanding of instructions provided today and agrees to view in [place].   Telephone follow up appointment with care management team member scheduled for: 05-03-2021 at [place] am  Noreene Larsson RN, MSN, [facility] Mobile: [phone]

## 2021-03-03 ENCOUNTER — Emergency Department
Admission: EM | Admit: 2021-03-03 | Discharge: 2021-03-03 | Disposition: A | Discharge status: Home / Self Care | Payer: Medicare Other | Attending: Emergency Medicine | Admitting: Emergency Medicine

## 2021-03-03 ENCOUNTER — Emergency Department: Payer: Medicare Other

## 2021-03-03 ENCOUNTER — Other Ambulatory Visit: Payer: Self-pay

## 2021-03-03 ENCOUNTER — Encounter: Payer: Self-pay | Admitting: Emergency Medicine

## 2021-03-03 ENCOUNTER — Encounter (INDEPENDENT_AMBULATORY_CARE_PROVIDER_SITE_OTHER): Payer: Medicare Other | Admitting: Vascular Surgery

## 2021-03-03 DIAGNOSIS — F22 Delusional disorders: Secondary | ICD-10-CM

## 2021-03-03 DIAGNOSIS — I2510 Atherosclerotic heart disease of native coronary artery without angina pectoris: Secondary | ICD-10-CM | POA: Diagnosis not present

## 2021-03-03 DIAGNOSIS — Z20822 Contact with and (suspected) exposure to covid-19: Secondary | ICD-10-CM | POA: Diagnosis not present

## 2021-03-03 DIAGNOSIS — Y900 Blood alcohol level of less than 20 mg/100 ml: Secondary | ICD-10-CM | POA: Insufficient documentation

## 2021-03-03 DIAGNOSIS — J069 Acute upper respiratory infection, unspecified: Secondary | ICD-10-CM | POA: Insufficient documentation

## 2021-03-03 DIAGNOSIS — I5032 Chronic diastolic (congestive) heart failure: Secondary | ICD-10-CM | POA: Insufficient documentation

## 2021-03-03 DIAGNOSIS — I4819 Other persistent atrial fibrillation: Secondary | ICD-10-CM | POA: Diagnosis present

## 2021-03-03 DIAGNOSIS — I6521 Occlusion and stenosis of right carotid artery: Secondary | ICD-10-CM | POA: Diagnosis present

## 2021-03-03 DIAGNOSIS — Z743 Need for continuous supervision: Secondary | ICD-10-CM | POA: Diagnosis not present

## 2021-03-03 DIAGNOSIS — I110 Hypertensive heart disease with heart failure: Secondary | ICD-10-CM | POA: Insufficient documentation

## 2021-03-03 DIAGNOSIS — J449 Chronic obstructive pulmonary disease, unspecified: Secondary | ICD-10-CM | POA: Diagnosis not present

## 2021-03-03 DIAGNOSIS — Z951 Presence of aortocoronary bypass graft: Secondary | ICD-10-CM | POA: Insufficient documentation

## 2021-03-03 DIAGNOSIS — E1169 Type 2 diabetes mellitus with other specified complication: Secondary | ICD-10-CM | POA: Diagnosis present

## 2021-03-03 DIAGNOSIS — Z7951 Long term (current) use of inhaled steroids: Secondary | ICD-10-CM | POA: Insufficient documentation

## 2021-03-03 DIAGNOSIS — R059 Cough, unspecified: Secondary | ICD-10-CM | POA: Diagnosis not present

## 2021-03-03 DIAGNOSIS — J45909 Unspecified asthma, uncomplicated: Secondary | ICD-10-CM | POA: Insufficient documentation

## 2021-03-03 DIAGNOSIS — R0902 Hypoxemia: Secondary | ICD-10-CM | POA: Diagnosis not present

## 2021-03-03 DIAGNOSIS — Z8616 Personal history of COVID-19: Secondary | ICD-10-CM | POA: Diagnosis not present

## 2021-03-03 DIAGNOSIS — R0602 Shortness of breath: Secondary | ICD-10-CM | POA: Diagnosis not present

## 2021-03-03 DIAGNOSIS — Z79899 Other long term (current) drug therapy: Secondary | ICD-10-CM | POA: Diagnosis not present

## 2021-03-03 DIAGNOSIS — J9801 Acute bronchospasm: Secondary | ICD-10-CM | POA: Diagnosis not present

## 2021-03-03 DIAGNOSIS — E119 Type 2 diabetes mellitus without complications: Secondary | ICD-10-CM | POA: Insufficient documentation

## 2021-03-03 DIAGNOSIS — Z7982 Long term (current) use of aspirin: Secondary | ICD-10-CM | POA: Insufficient documentation

## 2021-03-03 DIAGNOSIS — R6889 Other general symptoms and signs: Secondary | ICD-10-CM | POA: Diagnosis not present

## 2021-03-03 DIAGNOSIS — Z7901 Long term (current) use of anticoagulants: Secondary | ICD-10-CM | POA: Diagnosis not present

## 2021-03-03 DIAGNOSIS — I700 Atherosclerosis of aorta: Secondary | ICD-10-CM | POA: Diagnosis present

## 2021-03-03 DIAGNOSIS — F419 Anxiety disorder, unspecified: Secondary | ICD-10-CM | POA: Diagnosis present

## 2021-03-03 DIAGNOSIS — B9789 Other viral agents as the cause of diseases classified elsewhere: Secondary | ICD-10-CM | POA: Diagnosis not present

## 2021-03-03 DIAGNOSIS — F200 Paranoid schizophrenia: Secondary | ICD-10-CM | POA: Diagnosis present

## 2021-03-03 DIAGNOSIS — R0981 Nasal congestion: Secondary | ICD-10-CM | POA: Diagnosis present

## 2021-03-03 DIAGNOSIS — I10 Essential (primary) hypertension: Secondary | ICD-10-CM | POA: Diagnosis not present

## 2021-03-03 DIAGNOSIS — F423 Hoarding disorder: Secondary | ICD-10-CM | POA: Diagnosis present

## 2021-03-03 LAB — CBC WITH DIFFERENTIAL/PLATELET
Abs Immature Granulocytes: 0.03 10*3/uL (ref 0.00–0.07)
Basophils Absolute: 0 10*3/uL (ref 0.0–0.1)
Basophils Relative: 0 %
Eosinophils Absolute: 0.2 10*3/uL (ref 0.0–0.5)
Eosinophils Relative: 2 %
HCT: 46.4 % (ref 39.0–52.0)
Hemoglobin: 14.8 g/dL (ref 13.0–17.0)
Immature Granulocytes: 0 %
Lymphocytes Relative: 13 %
Lymphs Abs: 1.2 10*3/uL (ref 0.7–4.0)
MCH: 28.6 pg (ref 26.0–34.0)
MCHC: 31.9 g/dL (ref 30.0–36.0)
MCV: 89.6 fL (ref 80.0–100.0)
Monocytes Absolute: 0.8 10*3/uL (ref 0.1–1.0)
Monocytes Relative: 8 %
Neutro Abs: 7.1 10*3/uL (ref 1.7–7.7)
Neutrophils Relative %: 77 %
Platelets: 230 10*3/uL (ref 150–400)
RBC: 5.18 MIL/uL (ref 4.22–5.81)
RDW: 15.6 % — ABNORMAL HIGH (ref 11.5–15.5)
WBC: 9.3 10*3/uL (ref 4.0–10.5)
nRBC: 0 % (ref 0.0–0.2)

## 2021-03-03 LAB — URINE DRUG SCREEN, QUALITATIVE (ARMC ONLY)
Amphetamines, Ur Screen: NOT DETECTED
Barbiturates, Ur Screen: NOT DETECTED
Benzodiazepine, Ur Scrn: NOT DETECTED
Cannabinoid 50 Ng, Ur ~~LOC~~: NOT DETECTED
Cocaine Metabolite,Ur ~~LOC~~: NOT DETECTED
MDMA (Ecstasy)Ur Screen: NOT DETECTED
Methadone Scn, Ur: NOT DETECTED
Opiate, Ur Screen: NOT DETECTED
Phencyclidine (PCP) Ur S: NOT DETECTED
Tricyclic, Ur Screen: NOT DETECTED

## 2021-03-03 LAB — COMPREHENSIVE METABOLIC PANEL
ALT: 16 U/L (ref 0–44)
AST: 17 U/L (ref 15–41)
Albumin: 3.9 g/dL (ref 3.5–5.0)
Alkaline Phosphatase: 81 U/L (ref 38–126)
Anion gap: 11 (ref 5–15)
BUN: 16 mg/dL (ref 8–23)
CO2: 23 mmol/L (ref 22–32)
Calcium: 8.9 mg/dL (ref 8.9–10.3)
Chloride: 104 mmol/L (ref 98–111)
Creatinine, Ser: 0.98 mg/dL (ref 0.61–1.24)
GFR, Estimated: >60 mL/min (ref >60)
Glucose, Bld: 119 mg/dL — ABNORMAL HIGH (ref 70–99)
Potassium: 3.2 mmol/L — ABNORMAL LOW (ref 3.5–5.1)
Sodium: 138 mmol/L (ref 135–145)
Total Bilirubin: 1.4 mg/dL — ABNORMAL HIGH (ref 0.3–1.2)
Total Protein: 7.6 g/dL (ref 6.5–8.1)

## 2021-03-03 LAB — TROPONIN I (HIGH SENSITIVITY): Troponin I (High Sensitivity): 20 ng/L — ABNORMAL HIGH (ref <18)

## 2021-03-03 LAB — RESP PANEL BY RT-PCR (FLU A&B, COVID) ARPGX2
Influenza A by PCR: NEGATIVE
Influenza B by PCR: NEGATIVE
SARS Coronavirus 2 by RT PCR: NEGATIVE

## 2021-03-03 LAB — ETHANOL: Alcohol, Ethyl (B): <10 mg/dL

## 2021-03-03 LAB — ACETAMINOPHEN LEVEL: Acetaminophen (Tylenol), Serum: <10 ug/mL — ABNORMAL LOW (ref 10–30)

## 2021-03-03 LAB — SALICYLATE LEVEL: Salicylate Lvl: <7 mg/dL — ABNORMAL LOW (ref 7.0–30.0)

## 2021-03-03 LAB — BRAIN NATRIURETIC PEPTIDE: B Natriuretic Peptide: 169.5 pg/mL — ABNORMAL HIGH (ref 0.0–100.0)

## 2021-03-03 IMAGING — CR DG CHEST 2V
1 series · 2 of 2 positions shown · non-contrast
Comparison: Portable chest [DATE] and earlier.

CLINICAL DATA: 65-year-old male with shortness of breath.
Productive cough and nasal congestion.

EXAM:
CHEST - 2 VIEW

[Series 1: dg chest 2 view · 0.14mm/px · 2 of 2 slices shown]
[im 1/2]
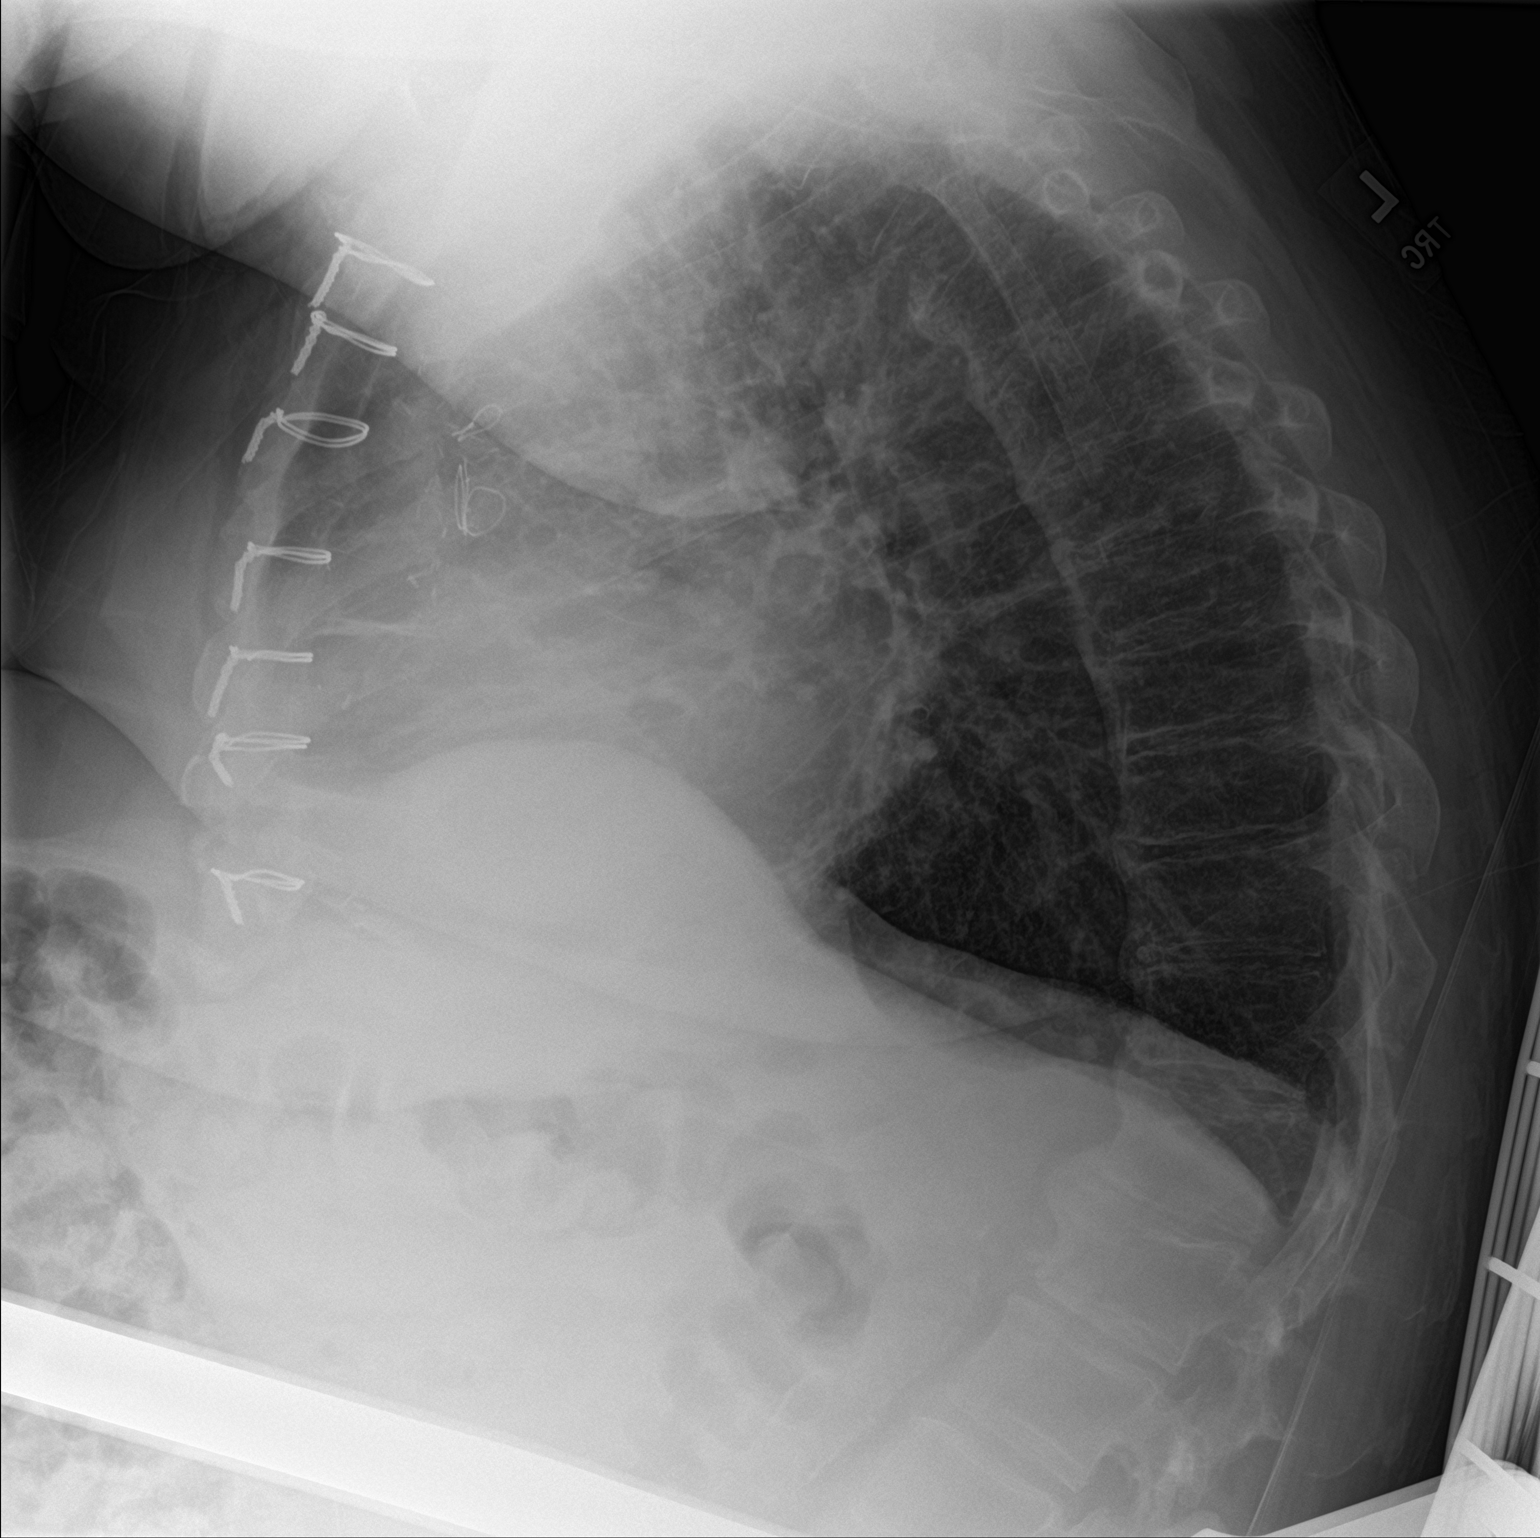
[im 2/2]
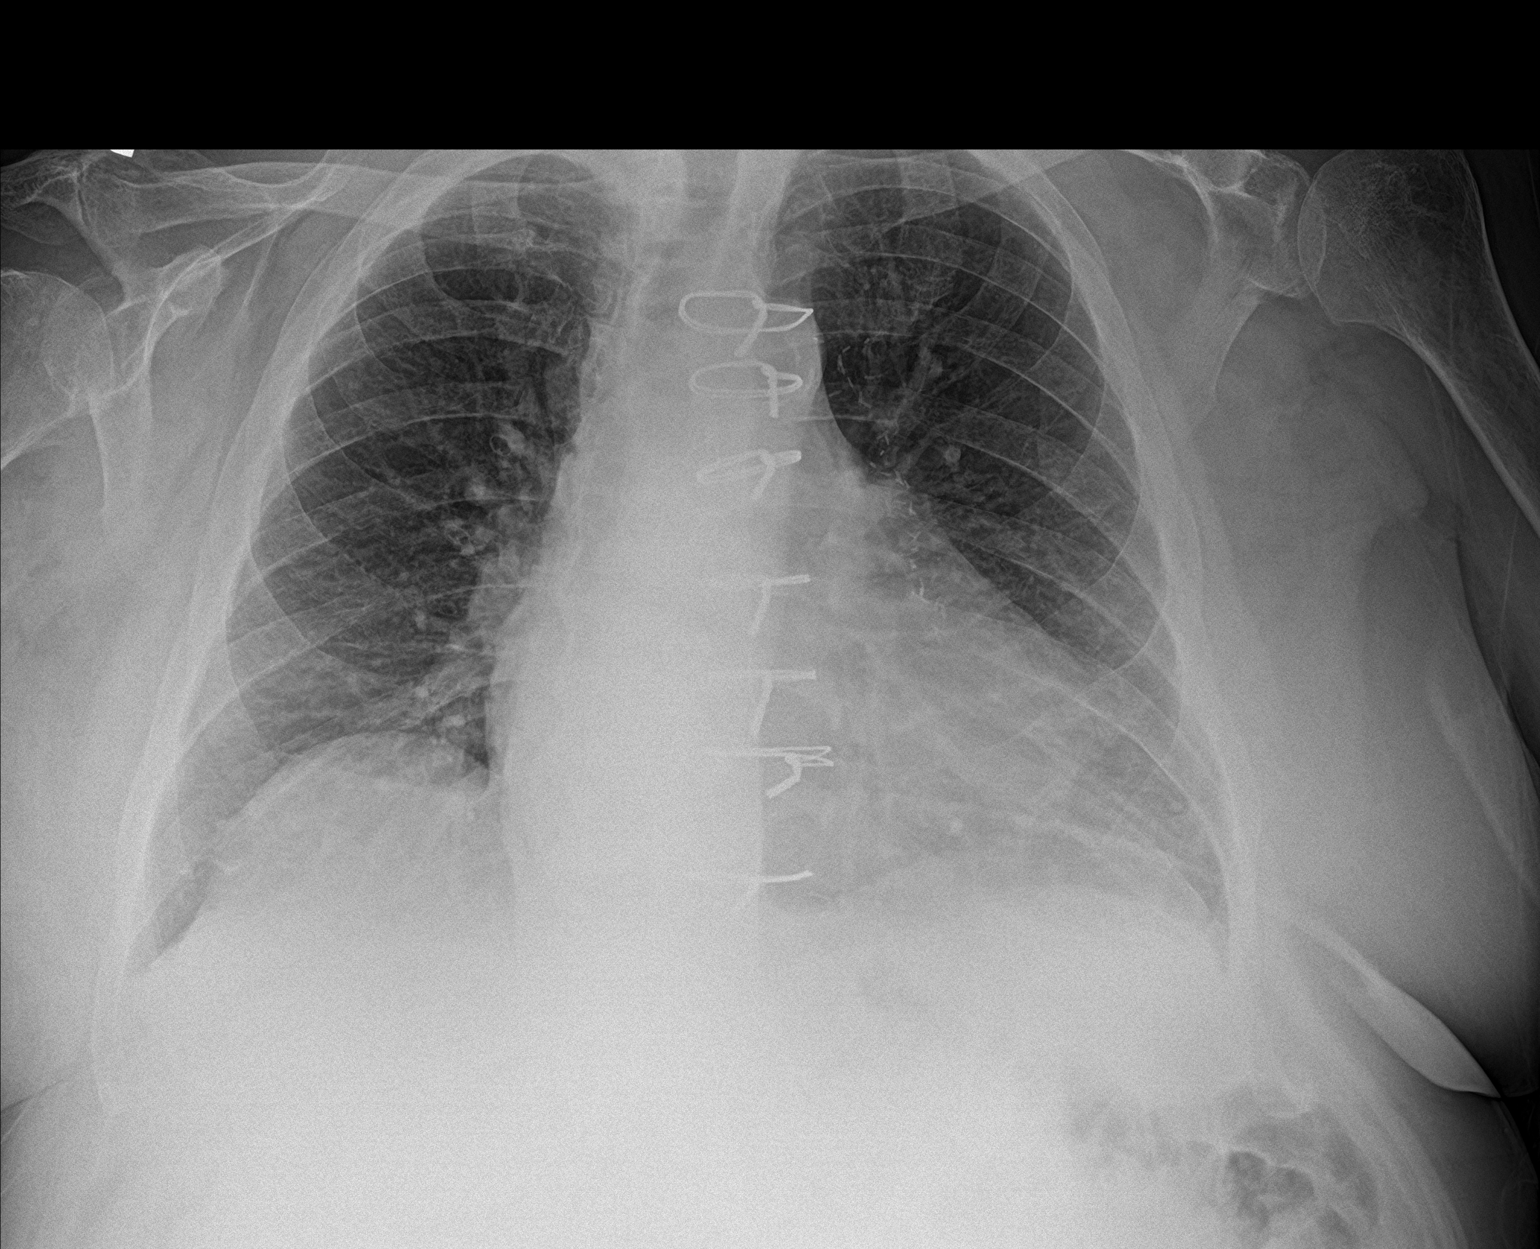

[2 of 2 positions shown; findings below may reference images not displayed]

FINDINGS: Seated AP and lateral views of the chest. Stable eventration of the
right hemidiaphragm, normal variant. Lung volumes are stable and
within normal limits. Prior CABG. Stable cardiac size and
mediastinal contours. No pneumothorax, pulmonary edema, pleural
effusion or confluent pulmonary opacity. Flowing endplate
osteophytes or syndesmophytes in the thoracic spine again noted with
levels of thoracic ankylosis. No acute osseous abnormality
identified. Abdominal Calcified aortic atherosclerosis. Negative
visible bowel gas pattern.
IMPRESSION: 1.  No acute cardiopulmonary abnormality.
2.  Aortic Atherosclerosis ([XW]-[XW]).

## 2021-03-03 MED ORDER — ALBUTEROL SULFATE (2.5 MG/3ML) 0.083% IN NEBU
3.0000 mL | INHALATION_SOLUTION | RESPIRATORY_TRACT | Status: DC | PRN
  Filled 2021-03-03: qty 3

## 2021-03-03 MED ORDER — ATORVASTATIN CALCIUM 20 MG PO TABS
80.0000 mg | ORAL_TABLET | Freq: Every day | ORAL | Status: DC
  Administered 2021-03-03: 80 mg via ORAL
  Filled 2021-03-03: qty 4

## 2021-03-03 MED ORDER — ALBUTEROL SULFATE (2.5 MG/3ML) 0.083% IN NEBU: 5.0000 mg | INHALATION_SOLUTION | Freq: Four times a day (QID) | RESPIRATORY_TRACT | Status: DC | PRN

## 2021-03-03 MED ORDER — RIVAROXABAN 20 MG PO TABS: 20.0000 mg | ORAL_TABLET | Freq: Every day | ORAL | Status: DC

## 2021-03-03 MED ORDER — PREDNISONE 50 MG PO TABS
60.0000 mg | ORAL_TABLET | Freq: Every day | ORAL | Status: DC
  Administered 2021-03-03: 60 mg via ORAL
  Filled 2021-03-03: qty 3

## 2021-03-03 MED ORDER — EMPAGLIFLOZIN 10 MG PO TABS: 10.0000 mg | ORAL_TABLET | Freq: Every day | ORAL | Status: DC

## 2021-03-03 MED ORDER — RISPERIDONE 1 MG PO TABS
0.5000 mg | ORAL_TABLET | Freq: Two times a day (BID) | ORAL | Status: DC
  Administered 2021-03-03: 0.5 mg via ORAL
  Filled 2021-03-03: qty 1

## 2021-03-03 MED ORDER — EZETIMIBE 10 MG PO TABS: 10.0000 mg | ORAL_TABLET | Freq: Every day | ORAL | Status: DC

## 2021-03-03 MED ORDER — ALBUTEROL SULFATE HFA 108 (90 BASE) MCG/ACT IN AERS: 2.0000 | INHALATION_SPRAY | RESPIRATORY_TRACT | 0 refills | Status: DC | PRN

## 2021-03-03 MED ORDER — METOPROLOL SUCCINATE ER 50 MG PO TB24
50.0000 mg | ORAL_TABLET | Freq: Every day | ORAL | Status: DC
  Administered 2021-03-03: 50 mg via ORAL
  Filled 2021-03-03: qty 1

## 2021-03-03 MED ORDER — ASPIRIN 81 MG PO CHEW
81.0000 mg | CHEWABLE_TABLET | Freq: Every day | ORAL | Status: DC
  Administered 2021-03-03: 81 mg via ORAL
  Filled 2021-03-03: qty 1

## 2021-03-03 MED ORDER — FUROSEMIDE 80 MG PO TABS: 80.0000 mg | ORAL_TABLET | Freq: Every day | ORAL | Status: DC

## 2021-03-03 MED ORDER — FUROSEMIDE 40 MG PO TABS
40.0000 mg | ORAL_TABLET | Freq: Every day | ORAL | Status: DC
  Filled 2021-03-03: qty 1

## 2021-03-03 MED ORDER — OMEGA 3 500 500 MG PO CAPS: 500.0000 mg | ORAL_CAPSULE | Freq: Every day | ORAL | Status: DC

## 2021-03-03 MED ORDER — IRBESARTAN 75 MG PO TABS: 37.5000 mg | ORAL_TABLET | Freq: Every day | ORAL | Status: DC

## 2021-03-03 MED ORDER — MOMETASONE FURO-FORMOTEROL FUM 100-5 MCG/ACT IN AERO: 2.0000 | INHALATION_SPRAY | Freq: Two times a day (BID) | RESPIRATORY_TRACT | Status: DC

## 2021-03-03 MED ORDER — PANTOPRAZOLE SODIUM 40 MG PO TBEC
40.0000 mg | DELAYED_RELEASE_TABLET | Freq: Every day | ORAL | Status: DC
  Administered 2021-03-03: 40 mg via ORAL
  Filled 2021-03-03: qty 1

## 2021-03-03 MED ORDER — PREDNISONE 20 MG PO TABS: 60.0000 mg | ORAL_TABLET | Freq: Every day | ORAL | 0 refills | Status: DC

## 2021-03-03 NOTE — ED Provider Notes (Addendum)
[facility] Emergency Department Provider Note  ____________________________________________   Event Date/Time   First MD Initiated Contact with Patient 03/03/21 0400     (approximate)  I have reviewed the triage vital signs and the nursing notes.   HISTORY  Chief Complaint Nasal Congestion    HPI Evan [name] is a 65 y.o. male with history of CAD, CHF, hypertension, diabetes, hyperlipidemia, CVA who presents to the emergency department complaints of nasal congestion, cough and wheezing for the past couple of days.  States he was feeling short of breath but this is resolved.  No chest pain or chest discomfort.  States he is also had vomiting and diarrhea.   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  He tells me that he is related to [place] and Jhon Mallozzi and that they are his third cousins.  He also shows me a video of Facebook live where he states [name] Paisley's wife was talking to him.  He denies SI, HI.        Past Medical History:  Diagnosis Date   Anginal pain ([place])    Anxiety disorder    Asthma    Atresia of esophagus without fistula    CAD (coronary artery disease)    Cellulitis    CHF (congestive heart failure) (HCC)    NYHA CLASS III,CHRONIC,DIASTOLIC   COPD (chronic obstructive pulmonary disease) ([place])    COVID-19    Diabetes mellitus without complication (HCC)    Edema    RIGHT LOWER LEG   Gastroesophageal reflux    H/O: GI bleed    History of pneumonia    Remote   History of scarlet fever    Childhood   Hyperlipidemia    Hypertension    Myocardial infarction ([place]) 2009   Obesity    Obstructive sleep apnea    Pain    CHRONIC BACK / ABDOMINAL   Panic disorder    Peripheral venous insufficiency    PTSD (post-traumatic stress disorder)    Retinopathy    DIABETIC   Stasis, venous    Stroke  [facility])    Vertigo     Patient Active Problem List   Diagnosis Date Noted   Other thrombophilia ([place]) 12/25/2020   Vitamin D deficiency 12/04/2020   Vitamin B12 deficiency 12/04/2020   Stenosis of right carotid artery 12/03/2020   History of CVA (cerebrovascular accident) 11/06/2020   History of 2019 novel coronavirus disease (COVID-19) 09/27/2020   Atherosclerosis of aorta ([place]) 12/04/2019   Persistent atrial fibrillation ([place]) 09/12/2019   CAD (coronary artery disease) 09/12/2019   Chronic venous stasis 09/12/2019   Hoarding disorder 09/12/2019   Cervical spinal stenosis 09/12/2019   Diabetic retinopathy ([place]) 09/12/2019   COPD, mild ([place]) 09/12/2019   Osteoporosis 09/09/2019   History of prostate cancer 09/09/2019   Anxiety 08/10/2019   Obstructive sleep apnea on CPAP 08/10/2019   Hyperlipidemia associated with type 2 diabetes mellitus ([place]) 08/10/2019   Chronic diastolic CHF (congestive heart failure) ([place]) 01/09/2014   Esophageal dysmotility 09/12/2013   Type 2 diabetes mellitus with morbid obesity ([place]) 02/14/2012   Obesity, morbid (more than 100 lbs over ideal weight or BMI > 40) ([place]) 07/26/2011   OLD MYOCARDIAL INFARCTION 03/02/2010   Hypertension associated with diabetes ([place]) 03/20/2009   CORONARY ATHEROSCLEROSIS, ARTERY BYPASS GRAFT 03/20/2009    Past Surgical History:  Procedure Laterality Date   CARDIAC CATHETERIZATION     CATARACT EXTRACTION Left    CATARACT EXTRACTION W/PHACO Right 05/03/2017   Procedure: CATARACT EXTRACTION PHACO AND INTRAOCULAR LENS PLACEMENT ([place]);  Surgeon: Leandrew Koyanagi, MD;  Location: [facility] ORS;  Service: Ophthalmology;  Laterality: Right;  [place] 00:35.3 AP% 12.3 CDE 4.33 Fluid Pack lot # [id number]        COLONOSCOPY WITH PROPOFOL N/A 11/26/2020   Procedure: COLONOSCOPY WITH PROPOFOL;  Surgeon: Jonathon Bellows, MD;  Location: [facility] ENDOSCOPY;  Service: Gastroenterology;  Laterality: N/A;  ANNET[place] TO PICK UP [phone]   CORONARY  ANGIOPLASTY WITH STENT PLACEMENT  2002   CORONARY ANGIOPLASTY WITH STENT PLACEMENT  1999   CORONARY ARTERY BYPASS GRAFT     x7   ESOPHAGOGASTRODUODENOSCOPY N/A 09/19/2016   Procedure: ESOPHAGOGASTRODUODENOSCOPY (EGD);  Surgeon: Lollie Sails, MD;  Location: [facility] ENDOSCOPY;  Service: Endoscopy;  Laterality: N/A;   ESOPHAGOGASTRODUODENOSCOPY (EGD) WITH PROPOFOL  11/26/2020   Procedure: ESOPHAGOGASTRODUODENOSCOPY (EGD) WITH PROPOFOL;  Surgeon: Jonathon Bellows, MD;  Location: [facility] ENDOSCOPY;  Service: Gastroenterology;;    Prior to Admission medications   Medication Sig Start Date End Date Taking? Authorizing Provider  albuterol (VENTOLIN HFA) 108 (90 Base) MCG/ACT inhaler Inhale 2 puffs into the lungs every 4 (four) hours as needed for wheezing or shortness of breath. 03/03/21  Yes Ossie Yebra, Cyril Mourning N, DO  predniSONE (DELTASONE) 20 MG tablet Take 3 tablets (60 mg total) by mouth daily. Start on 03/04/21 03/03/21  Yes Nhi Butrum, Delice Bison, DO  acetaminophen (TYLENOL) 500 MG tablet Take 500 mg by mouth every 6 (six) hours as needed.    [provider]  aspirin 81 MG chewable tablet Chew 1 tablet (81 mg total) by mouth daily. 11/23/20   [name], Henrine Screws T, NP  atorvastatin (LIPITOR) 80 MG tablet Take 1 tablet (80 mg total) by mouth daily. 11/26/20 02/19/22  [name], Henrine Screws T, NP  Blood Glucose Monitoring Suppl (ONETOUCH VERIO) w/Device KIT Use to check blood sugar 3 to 4 times a day and document.  Please bring to visits for review. 07/08/20   [name], Henrine Screws T, NP  budesonide-formoterol (SYMBICORT) 80-4.5 MCG/ACT inhaler Inhale 2 puffs into the lungs 2 (two) times daily. 09/24/20   [name] T, NP  Cholecalciferol 125 MCG (5000 UT) TABS Take 1 tablet (5,000 Units total) by mouth daily. 12/04/20   [name], NP  Elastic Bandages & Supports (MEDICAL COMPRESSION STOCKINGS) MISC 1 Package by Does not apply route daily. 08/28/20   Sable Feil, PA-C  empagliflozin (JARDIANCE) 10 MG TABS tablet Take 1  tablet (10 mg total) by mouth daily. 07/08/20   [name], Henrine Screws T, NP  ezetimibe (ZETIA) 10 MG tablet Take 1 tablet (10 mg total) by mouth daily. 10/07/20   [name], Henrine Screws T, NP  furosemide (LASIX) 40 MG tablet TAKE 2 TABLETS BY MOUTH ONCE EVERY MORNING AND 1 TABLET ONCE EVERY EVENING 01/20/21   [name], [name] T, NP  glucose blood (ONETOUCH VERIO) test strip Use to check blood sugar 3 to 4 times a day. 07/08/20   [name], Henrine Screws T, NP  metoprolol succinate (TOPROL XL) 50 MG 24 hr tablet Take 1 tablet (50 mg total) by mouth daily. 07/08/20   [name], NP  MODERNA COVID-19 VACCINE 100 MCG/0.5ML injection  08/28/20   [provider]  nitroGLYCERIN (NITROSTAT) 0.4 MG SL tablet Place 1 tablet (0.4 mg total) under the tongue every 5 (five) minutes as needed for chest pain. 10/10/19   [name]  T, NP  olmesartan (BENICAR) 5 MG tablet Take 2 tablets (10 mg total) by mouth daily. 12/03/20   [name], Henrine Screws T, NP  Omega-3 Fatty Acids (OMEGA 3 500 PO) Take 500 mg by mouth daily.    [provider]  OneTouch Delica Lancets 09Q MISC Use to check blood sugar 2-3 times a day. 09/29/20   [name], Henrine Screws T, NP  pantoprazole (PROTONIX) 40 MG tablet Take 1 tablet (40 mg total) by mouth daily. 04/08/20   [name], Henrine Screws T, NP  risperiDONE (RISPERDAL) 0.5 MG tablet Take 1 tablet (0.5 mg total) by mouth 2 (two) times daily. 07/08/20   [name], Henrine Screws T, NP  rivaroxaban (XARELTO) 20 MG TABS tablet Take 1 tablet (20 mg total) by mouth at bedtime. 07/08/20   [name] T, NP  sharps container 1 each by Does not apply route as needed. 09/29/20   [name], NP  Skin Protectants, Misc. (EUCERIN) cream Apply topically as needed for dry skin. 08/28/20   Sable Feil, PA-C  triamcinolone cream (KENALOG) 0.1 % APPLY TO AFFECTED AREA(s) TWICE DAILY 12/29/20   [name], Henrine Screws T, NP  vitamin B-12 (CYANOCOBALAMIN) 1000 MCG tablet Take 1 tablet (1,000 mcg total) by mouth daily. 12/04/20   [name]  T, NP    Allergies Penicillin g, Sulfa antibiotics, Tiotropium, and Zoloft [sertraline hcl]  Family History  Problem Relation Age of Onset   Heart attack Mother    Hypertension Mother    Hyperlipidemia Mother    Heart attack Brother 6       MI   Coronary artery disease Other     Social History Social History   Tobacco Use   Smoking status: Never   Smokeless tobacco: Never  Vaping Use   Vaping Use: Never used  Substance Use Topics   Alcohol use: No   Drug use: No    Review of Systems Constitutional: No fever. Eyes: No visual changes. ENT: No sore throat. Cardiovascular: Denies chest pain. Respiratory: + shortness of breath. Gastrointestinal: No nausea, vomiting, diarrhea. Genitourinary: Negative for dysuria. Musculoskeletal: Negative for back pain. Skin: Negative for rash. Neurological: Negative for focal weakness or numbness.  ____________________________________________   PHYSICAL EXAM:  VITAL SIGNS: ED Triage Vitals  Enc Vitals Group     BP 03/03/21 0210 128/74     Pulse Rate 03/03/21 0210 88     Resp 03/03/21 0210 18     Temp 03/03/21 0210 98.2 F (36.8 C)     Temp Source 03/03/21 0210 Oral     SpO2 03/03/21 0210 95 %     Weight 03/03/21 0208 228 lb (103.4 kg)     Height 03/03/21 0208 5' 5"  (1.651 m)     Head Circumference --      Peak Flow --      Pain Score 03/03/21 0208 0     Pain Loc --      Pain Edu? --      Excl. in [place]? --    CONSTITUTIONAL: Alert and oriented and responds appropriately to questions.  Obese, disheveled HEAD: Normocephalic EYES: Conjunctivae clear, pupils appear equal, EOM appear intact ENT: normal nose; moist mucous membranes NECK: Supple, normal ROM CARD: Irregularly irregular and rate controlled; S1 and S2 appreciated; no murmurs, no clicks, no rubs, no gallops RESP: Normal chest excursion without splinting or tachypnea; breath sounds clear and equal bilaterally; no wheezes, no rhonchi, no rales, no hypoxia or  respiratory distress, speaking full sentences ABD/GI: Normal bowel sounds;  non-distended; soft, non-tender, no rebound, no guarding, no peritoneal signs, no hepatosplenomegaly BACK: The back appears normal EXT: Normal ROM in all joints; no deformity noted, no edema; no cyanosis SKIN: Normal color for age and race; warm; no rash on exposed skin NEURO: Moves all extremities equally PSYCH: Denies SI, HI.  Appears to be delusional.  ____________________________________________   LABS (all labs ordered are listed, but only abnormal results are displayed)  Labs Reviewed  CBC WITH DIFFERENTIAL/PLATELET - Abnormal; Notable for the following components:      Result Value   RDW 15.6 (*)    All other components within normal limits  COMPREHENSIVE METABOLIC PANEL - Abnormal; Notable for the following components:   Potassium 3.2 (*)    Glucose, Bld 119 (*)    Total Bilirubin 1.4 (*)    All other components within normal limits  BRAIN NATRIURETIC PEPTIDE - Abnormal; Notable for the following components:   B Natriuretic Peptide 169.5 (*)    All other components within normal limits  ACETAMINOPHEN LEVEL - Abnormal; Notable for the following components:   Acetaminophen (Tylenol), Serum <10 (*)    All other components within normal limits  SALICYLATE LEVEL - Abnormal; Notable for the following components:   Salicylate Lvl <0.1 (*)    All other components within normal limits  TROPONIN I (HIGH SENSITIVITY) - Abnormal; Notable for the following components:   Troponin I (High Sensitivity) 20 (*)    All other components within normal limits  RESP PANEL BY RT-PCR (FLU A&B, COVID) ARPGX2  ETHANOL  URINE DRUG SCREEN, QUALITATIVE ([facility] ONLY)   ____________________________________________  EKG   EKG Interpretation  Date/Time:  Thursday March 03 2021 04:38:08 EDT Ventricular Rate:  88 PR Interval:    QRS Duration: 130 QT Interval:  416 QTC Calculation: 503 R Axis:   68 Text  Interpretation: Atrial fibrillation with premature ventricular or aberrantly conducted complexes Non-specific intra-ventricular conduction block T wave abnormality, consider inferolateral ischemia Abnormal ECG No significant change since last tracing Confirmed by Pryor Curia [id number]) on 03/03/2021 4:58:16 AM         ____________________________________________  RADIOLOGY Jessie Foot Donte Lenzo, personally viewed and evaluated these images (plain radiographs) as part of my medical decision making, as well as reviewing the written report by the radiologist.  ED MD interpretation: Chest x-ray clear.  Official radiology report(s): DG Chest 2 View  Result Date: 03/03/2021 CLINICAL DATA:  65 year old male with shortness of breath. Productive cough and nasal congestion. EXAM: CHEST - 2 VIEW COMPARISON:  Portable chest 02/19/2021 and earlier. FINDINGS: Seated AP and lateral views of the chest. Stable eventration of the right hemidiaphragm, normal variant. Lung volumes are stable and within normal limits. Prior CABG. Stable cardiac size and mediastinal contours. No pneumothorax, pulmonary edema, pleural effusion or confluent pulmonary opacity. Flowing endplate osteophytes or syndesmophytes in the thoracic spine again noted with levels of thoracic ankylosis. No acute osseous abnormality identified. Abdominal Calcified aortic atherosclerosis. Negative visible bowel gas pattern. IMPRESSION: 1.  No acute cardiopulmonary abnormality. 2.  Aortic Atherosclerosis (ICD10-I70.0). Electronically Signed   By: Genevie Ann M.D.   On: 03/03/2021 04:42    ____________________________________________   PROCEDURES  Procedure(s) performed (including Critical Care):  Procedures    ____________________________________________   INITIAL IMPRESSION / ASSESSMENT AND PLAN / ED COURSE  As part of my medical decision making, I reviewed the following data within the [place] notes reviewed and  incorporated, Labs reviewed , EKG interpreted , Old EKG reviewed,  Radiograph reviewed , A consult was requested and obtained from this/these consultant(s) Psychiatry, and Notes from prior ED visits         Patient here with complaints of cough, congestion.  States he is wheezing but has no wheezing on exam, lungs are clear.  He only has some very mild wheezing with very forceful expiration.  Reports he has history of asthma.  Will order albuterol inhaler, prednisone for the next 5 days.  COVID and flu negative.  Labs, chest x-ray pending.  I am also concerned that patient is delusional, psychotic today.  He appears to be very focused on his relationships with multiple celebrities wanting to show [place] videos repeatedly.  This actually seems to be his biggest concern when I am talking to him and examining him.  We will obtain screening labs and consult psychiatry.  I have placed him under involuntary commitment at this time.  ED PROGRESS  Patient seen by Caroline Sauger, nurse practitioner with psychiatry.  At this time she feels patient could be discharged home however I am not comfortable with this.  Psychiatry will reassess patient in the morning to determine final disposition.  5:30 AM  Pt continues to call physician and nursing staff over repeatedly to show [place] different videos and contact information that he has on his phone for different celebrities.  I am concerned that even though patient does not seem dangerous that this clearly appears to be affecting his life.  Does not appear he is on psychiatric medications or has an outpatient provider.  It appears he disenrolled from [facility] due to their concerns that he was delusional.  He was barely even able to tell me about his URI symptoms before stopping to start talking about different celebrities.  It is unclear if he has good family/friend support.  He does tell me that he was just evicted from his housing.   6:50 AM  Patient's labs are  unremarkable.  No leukocytosis.  Troponin is 20 which appears to be his baseline.  BNP 169.  Chest x-ray shows no acute abnormality.  EKG nonischemic.  At this time patient is medically cleared.  If patient is discharged by psychiatry, he will need to be discharged with prednisone burst as well as albuterol inhaler.   I reviewed all nursing notes and pertinent previous records as available.  I have reviewed and interpreted any EKGs, lab and urine results, imaging (as available).  ____________________________________________   FINAL CLINICAL IMPRESSION(S) / ED DIAGNOSES  Final diagnoses:  Delusions ([place])  Viral upper respiratory tract infection  Bronchospasm, acute     ED Discharge Orders          Ordered    albuterol (VENTOLIN HFA) 108 (90 Base) MCG/ACT inhaler  Every 4 hours PRN        03/03/21 0652    predniSONE (DELTASONE) 20 MG tablet  Daily        03/03/21 5809            *Please note:  Rubert L Chanthavong was evaluated in Emergency Department on 03/03/2021 for the symptoms described in the history of present illness. He was evaluated in the context of the global COVID-19 pandemic, which necessitated consideration that the patient might be at risk for infection with the SARS-CoV-2 virus that causes COVID-19. Institutional protocols and algorithms that pertain to the evaluation of patients at risk for COVID-19 are in a state of rapid change based on information released by regulatory bodies including the CDC and  federal and state organizations. These policies and algorithms were followed during the patient's care in the ED.  Some ED evaluations and interventions may be delayed as a result of limited staffing during and the pandemic.*   Note:  This document was prepared using Dragon voice recognition software and may include unintentional dictation errors.    Laurette Villescas, Delice Bison, DO 03/03/21 [place], Delice Bison, DO 03/03/21 [phone]

## 2021-03-03 NOTE — ED Triage Notes (Signed)
Pt to triage via w/c with no distress noted; c/o nasal congestion and prod cough white sputum tonight

## 2021-03-03 NOTE — ED Notes (Signed)
Pt discharged home. VS stable. All belonging (cell phone, keys) returned to patient.  Pt denies SI.

## 2021-03-03 NOTE — BH Assessment (Signed)
Comprehensive Clinical Assessment (CCA) Note  03/03/2021 Evan [name] [id number]  Chief Complaint:  Chief Complaint  Patient presents with   Nasal Congestion   Visit Diagnosis: Anxiety   Type 2 diabetes mellitus with morbid obesity (HCC)   Persistent atrial fibrillation (HCC)   Hoarding disorder   COPD, mild (HCC)   Atherosclerosis of aorta (HCC)   Stenosis of right carotid artery   Evan [name] is a 65 y.o. male patient presented to [facility] ED via EMS was voluntarily placed under Involuntarily Commitment status (IVC). The patient is delusional, stating he is related and or close friends with sveral celebrate personalities. Pt presents with compliants of congestion. He denies any illicit drug or alcohol use. Pt did not consent to collecting collateral history.He states that his sleeping patterns and appetite are normal. Patient is alert/ oriented/clam/cooperative and denies SI/HI/Psychosis. Pt. denies any suicidal ideation, plan or intent. Pt. denies the presence of any auditory or visual hallucinations at this time.   A behavioral health assessment has been completed including evaluation of the patient,, reviewing available medical/clinic records, evaluating his unique risk and protective factors, and discussing treatment recommendations.  After thorough evaluation and review of information currently present on examination, there are insufficient findings to indicate patient meets criteria for IVC or requires an inpatient level of care. Patient is linear, direct, and organized; denies SI, HI, and AVH and is not currently significantly impaired, grossly psychotic, or manic on exam. Detailed risk assessment is complete based on clinical exam and individual risk factors and acute suicide risk is low and acute violence risk is low.    CCA Screening, Triage and Referral (STR)  Patient Reported Information How did you hear about [place]? Self  Referral name: No data recorded Referral phone  number: No data recorded  Whom do you see for routine medical problems? No data recorded Practice/Facility Name: No data recorded Practice/Facility Phone Number: No data recorded Name of Contact: No data recorded Contact Number: No data recorded Contact Fax Number: No data recorded Prescriber Name: No data recorded Prescriber Address (if known): No data recorded  What Is the Reason for Your Visit/Call Today? Congestion  How Long Has This Been Causing You Problems? 1 wk - 1 month  What Do You Feel Would Help You the Most Today? No data recorded  Have You Recently Been in Any Inpatient Treatment (Hospital/Detox/Crisis Center/28-Day Program)? No data recorded Name/Location of Program/Hospital:No data recorded How Long Were You There? No data recorded When Were You Discharged? No data recorded  Have You Ever Received Services From [facility] Before? No data recorded Who Do You See at [facility]? No data recorded  Have You Recently Had Any Thoughts About Hurting Yourself? No  Are You Planning to Commit Suicide/Harm Yourself At This time? No   Have you Recently Had Thoughts About [place]? No  Explanation: No data recorded  Have You Used Any Alcohol or Drugs in the Past 24 Hours? No  How Long Ago Did You Use Drugs or Alcohol? No data recorded What Did You Use and How Much? No data recorded  Do You Currently Have a Therapist/Psychiatrist? No  Name of Therapist/Psychiatrist: No data recorded  Have You Been Recently Discharged From Any Office Practice or Programs? No  Explanation of Discharge From Practice/Program: No data recorded    CCA Screening Triage Referral Assessment Type of Contact: Face-to-Face  Is this Initial or Reassessment? No data recorded Date Telepsych consult ordered in [facility]:  No data  recorded Time Telepsych consult ordered in [facility]:  No data recorded  Patient Reported Information Reviewed? No data recorded Patient Left Without Being Seen?  No data recorded Reason for Not Completing Assessment: No data recorded  Collateral Involvement: No data recorded  Does Patient Have a [place]? No data recorded Name and Contact of Legal Guardian: No data recorded If Minor and Not Living with Parent(s), Who has Custody? No data recorded Is CPS involved or ever been involved? No data recorded Is APS involved or ever been involved? No data recorded  Patient Determined To Be At Risk for Harm To Self or Others Based on Review of Patient Reported Information or Presenting Complaint? No data recorded Method: No data recorded Availability of Means: No data recorded Intent: No data recorded Notification Required: No data recorded Additional Information for Danger to Others Potential: No data recorded Additional Comments for Danger to Others Potential: No data recorded Are There Guns or Other Weapons in Your Home? No data recorded Types of Guns/Weapons: No data recorded Are These Weapons Safely Secured?                            No data recorded Who Could Verify You Are Able To Have These Secured: No data recorded Do You Have any Outstanding Charges, Pending Court Dates, Parole/Probation? No data recorded Contacted To Inform of Risk of Harm To Self or Others: No data recorded  Location of Assessment: [facility] ED   Does Patient Present under Involuntary Commitment? No data recorded IVC Papers Initial File Date: No data recorded  [place] of Residence: No data recorded  Patient Currently Receiving the Following Services: No data recorded  Determination of Need: Routine (7 days)   Options For Referral: No data recorded    CCA Biopsychosocial Intake/Chief Complaint:  No data recorded Current Symptoms/Problems: No data recorded  Patient Reported Schizophrenia/Schizoaffective Diagnosis in Past: No   Strengths: No data recorded Preferences: No data recorded Abilities: No data recorded  Type of Services Patient  Feels are Needed: No data recorded  Initial Clinical Notes/Concerns: No data recorded  Mental Health Symptoms Depression:   None   Duration of Depressive symptoms: No data recorded  Mania:   None   Anxiety:    Worrying   Psychosis:   Delusions   Duration of Psychotic symptoms: No data recorded  Trauma:   N/A   Obsessions:   None   Compulsions:   None   Inattention:   Does not seem to listen   Hyperactivity/Impulsivity:   None   Oppositional/Defiant Behaviors:   None   Emotional Irregularity:   None   Other Mood/Personality Symptoms:  No data recorded   Mental Status Exam Appearance and self-care  Stature:   Average   Weight:   Overweight   Clothing:   Age-appropriate   Grooming:   Normal   Cosmetic use:   None   Posture/gait:   Normal   Motor activity:  No data recorded  Sensorium  Attention:   Distractible   Concentration:   Preoccupied   Orientation:   Object; Person; Place; Situation; Time; X5   Recall/memory:   Normal   Affect and Mood  Affect:  No data recorded  Mood:   Anxious   Relating  Eye contact:   Normal   Facial expression:   Anxious   Attitude toward examiner:   Cooperative; Defensive   Thought and Language  Speech flow:  Other (Comment)   Thought content:   Delusions   Preoccupation:   Obsessions   Hallucinations:  No data recorded  Organization:  No data recorded  [employer] of Knowledge:   Average   Intelligence:   Average   Abstraction:   Normal   Judgement:   Poor   Reality Testing:   Distorted   Insight:   Flashes of insight   Decision Making:   Impulsive   Social Functioning  Social Maturity:   Responsible   Social Judgement:   Normal   Stress  Stressors:   Housing   Coping Ability:   Overwhelmed   Skill Deficits:  No data recorded  Supports:  No data recorded    Religion:    Leisure/Recreation: Leisure / Recreation Do You Have  Hobbies?: No  Exercise/Diet: Exercise/Diet Do You Exercise?: No Have You Gained or Lost A Significant Amount of Weight in the Past Six Months?: No Do You Have Any Trouble Sleeping?: No   CCA Employment/Education Employment/Work Situation:    Education:     CCA Family/Childhood History Family and Relationship History:    Childhood History:     Child/Adolescent Assessment:     CCA Substance Use Alcohol/Drug Use: Alcohol / Drug Use Pain Medications: SEE MAR Prescriptions: SEE MAR Over the Counter: SEE PTA History of alcohol / drug use?: No history of alcohol / drug abuse                         ASAM's:  Six Dimensions of Multidimensional Assessment  Dimension 1:  Acute Intoxication and/or Withdrawal Potential:      Dimension 2:  Biomedical Conditions and Complications:      Dimension 3:  Emotional, Behavioral, or Cognitive Conditions and Complications:     Dimension 4:  Readiness to Change:     Dimension 5:  Relapse, Continued use, or Continued Problem Potential:     Dimension 6:  Recovery/Living Environment:     ASAM Severity Score:    ASAM Recommended Level of Treatment:     Substance use Disorder (SUD)    Recommendations for Services/Supports/Treatments:    DSM5 Diagnoses: Patient Active Problem List   Diagnosis Date Noted   Other thrombophilia ([place]) 12/25/2020   Vitamin D deficiency 12/04/2020   Vitamin B12 deficiency 12/04/2020   Stenosis of right carotid artery 12/03/2020   History of CVA (cerebrovascular accident) 11/06/2020   History of 2019 novel coronavirus disease (COVID-19) 09/27/2020   Atherosclerosis of aorta (HCC) 12/04/2019   Persistent atrial fibrillation ([place]) 09/12/2019   CAD (coronary artery disease) 09/12/2019   Chronic venous stasis 09/12/2019   Hoarding disorder 09/12/2019   Cervical spinal stenosis 09/12/2019   Diabetic retinopathy ([place]) 09/12/2019   COPD, mild ([place]) 09/12/2019   Osteoporosis 09/09/2019    History of prostate cancer 09/09/2019   Anxiety 08/10/2019   Obstructive sleep apnea on CPAP 08/10/2019   Hyperlipidemia associated with type 2 diabetes mellitus ([place]) 08/10/2019   Chronic diastolic CHF (congestive heart failure) ([place]) 01/09/2014   Esophageal dysmotility 09/12/2013   Type 2 diabetes mellitus with morbid obesity ([place]) 02/14/2012   Obesity, morbid (more than 100 lbs over ideal weight or BMI > 40) ([place]) 07/26/2011   OLD MYOCARDIAL INFARCTION 03/02/2010   Hypertension associated with diabetes ([place]) 03/20/2009   CORONARY ATHEROSCLEROSIS, ARTERY BYPASS GRAFT 03/20/2009    Patient Centered Plan: Patient is on the following Treatment Plan(s):  Anxiety   Referrals to Alternative Service(s): Referred  to Alternative Service(s):   Place:   Date:   Time:    Referred to Alternative Service(s):   Place:   Date:   Time:    Referred to Alternative Service(s):   Place:   Date:   Time:    Referred to Alternative Service(s):   Place:   Date:   Time:     Laretta Alstrom

## 2021-03-03 NOTE — ED Notes (Signed)
Pt dressed into hospital appropriate scrubs by this RN and Terri Piedra, RN.  Belongings placed into 2 bags.  Contents include: 1 pair black shoes, 1 white underwear, 1 green t shirt, 1 black sweat pants, 1 red and black hand bag, 1 black cell phone.

## 2021-03-03 NOTE — ED Notes (Signed)
Pt sitting in hall talking to himself.

## 2021-03-03 NOTE — Consult Note (Signed)
[facility] Face-to-Face Psychiatry Consult   Reason for Consult: Consult for 65 year old man who came voluntarily to the hospital for somatic symptoms but was then identified as potentially having psychiatric problems Referring Physician: Ellender Hose patient Identification: Evan [name] MRN:  [id number] Principal Diagnosis: Delusional disorder ([place]) Diagnosis:  Principal Problem:   Delusional disorder ([place]) Active Problems:   Anxiety   Type 2 diabetes mellitus with morbid obesity ([place])   Persistent atrial fibrillation (HCC)   Hoarding disorder   COPD, mild ([place])   Atherosclerosis of aorta ([place])   Stenosis of right carotid artery   Total Time spent with patient: 1 hour  Subjective:   Evan [name] is a 65 y.o. male patient admitted with "I do not know why I am still here, I have bills to pay".  HPI: Patient seen chart reviewed.  This is a 65 year old man who came to the hospital last night voluntarily.  Apparently his initial complaint was of shortness of breath.  He tells me that his age who comes to his house daily had mopped his floors with [employer]or[employer] and it had bothered his breathing so that he was short of breath.  On evaluation here it became clear to several providers that he was talking about some confusing and probably psychotic issues.  Talked to several people about how he was related to celebrities with the same last name and how he was in business with Ivin Booty.  He was placed under IVC because of these possible psychotic symptoms.  Patient was cooperative with the interview today.  Says that he was having shortness of breath last night.  Admits that he is feeling better today.  This by the way was evaluated by the physician last night who did not appreciate any actual wheezing.  Patient spontaneously brings up the subject with me of his belief that he has multiple celebrity connections.  He says he is related to [employer] although he admits that she does not know him.  He talks  quite a bit about how the country singer Ivin Booty is in business with him and how he met him when [place] [employer] was filming in [place].  At no point however does the patient make any statements of any homicidal intent or suicidal intent.  He is not talking about stalking anyone.  There is no evidence that anyone has been bothered by any of his beliefs or behavior.  Looking through the old chart it seems clear that these are chronic believes that the patient has had.  They have been described multiple times in the past.  The people who work with him doing in-home care seem to be well aware of them.  Patient is denying depression.  He says he sleeps 8 hours a night.  Says he keeps all of his appointments with his primary care doctor and stays on all of his prescription medicines for his various medical problems.  Denies any alcohol or drug abuse.  Past Psychiatric History: This condition has been diagnosed as delusional disorder in the past.  There is some mention in the chart that gives evidence that he was prescribed Risperdal at 1 point.  I brought this up with the patient but he tried to change the subject.  Claimed that he could not remember who had prescribed that.  Certainly he is not taking it now.  There is no report or history of any suicide attempts any violence any aggression or any dangerous behavior.  Does not sound like he  is ever engaged in outpatient mental health treatment.  Risk to Self:   Risk to Others:   Prior Inpatient Therapy:   Prior Outpatient Therapy:    Past Medical History:  Past Medical History:  Diagnosis Date   Anginal pain ([place])    Anxiety disorder    Asthma    Atresia of esophagus without fistula    CAD (coronary artery disease)    Cellulitis    CHF (congestive heart failure) (HCC)    NYHA CLASS III,CHRONIC,DIASTOLIC   COPD (chronic obstructive pulmonary disease) ([place])    COVID-19    Diabetes mellitus without complication (HCC)    Edema    RIGHT LOWER  LEG   Gastroesophageal reflux    H/O: GI bleed    History of pneumonia    Remote   History of scarlet fever    Childhood   Hyperlipidemia    Hypertension    Myocardial infarction ([place]) 2009   Obesity    Obstructive sleep apnea    Pain    CHRONIC BACK / ABDOMINAL   Panic disorder    Peripheral venous insufficiency    PTSD (post-traumatic stress disorder)    Retinopathy    DIABETIC   Stasis, venous    Stroke [facility])    Vertigo     Past Surgical History:  Procedure Laterality Date   CARDIAC CATHETERIZATION     CATARACT EXTRACTION Left    CATARACT EXTRACTION W/PHACO Right 05/03/2017   Procedure: CATARACT EXTRACTION PHACO AND INTRAOCULAR LENS PLACEMENT (IOC);  Surgeon: Leandrew Koyanagi, MD;  Location: [facility] ORS;  Service: Ophthalmology;  Laterality: Right;  [place] 00:35.3 AP% 12.3 CDE 4.33 Fluid Pack lot # [id number]        COLONOSCOPY WITH PROPOFOL N/A 11/26/2020   Procedure: COLONOSCOPY WITH PROPOFOL;  Surgeon: Jonathon Bellows, MD;  Location: [facility] ENDOSCOPY;  Service: Gastroenterology;  Laterality: N/A;  ANNETTE TO PICK UP [phone]   CORONARY ANGIOPLASTY WITH STENT PLACEMENT  2002   CORONARY ANGIOPLASTY WITH STENT PLACEMENT  1999   CORONARY ARTERY BYPASS GRAFT     x7   ESOPHAGOGASTRODUODENOSCOPY N/A 09/19/2016   Procedure: ESOPHAGOGASTRODUODENOSCOPY (EGD);  Surgeon: Lollie Sails, MD;  Location: [facility] ENDOSCOPY;  Service: Endoscopy;  Laterality: N/A;   ESOPHAGOGASTRODUODENOSCOPY (EGD) WITH PROPOFOL  11/26/2020   Procedure: ESOPHAGOGASTRODUODENOSCOPY (EGD) WITH PROPOFOL;  Surgeon: Jonathon Bellows, MD;  Location: [facility] ENDOSCOPY;  Service: Gastroenterology;;   Family History:  Family History  Problem Relation Age of Onset   Heart attack Mother    Hypertension Mother    Hyperlipidemia Mother    Heart attack Brother 58       MI   Coronary artery disease Other    Family Psychiatric  History: Unknown.  Patient says he is not aware of any family history of mental illness Social  History:  Social History   Substance and Sexual Activity  Alcohol Use No     Social History   Substance and Sexual Activity  Drug Use No    Social History   Socioeconomic History   Marital status: Single    Spouse name: Not on file   Number of children: Not on file   Years of education: Not on file   Highest education level: Not on file  Occupational History    Comment: disabled  Tobacco Use   Smoking status: Never   Smokeless tobacco: Never  Vaping Use   Vaping Use: Never used  Substance and Sexual Activity   Alcohol use: No   Drug  use: No   Sexual activity: Not Currently  Other Topics Concern   Not on file  Social History Narrative   Disabled   Single   Social Determinants of Health   Financial Resource Strain: Low Risk    Difficulty of Paying Living Expenses: Not hard at all  Food Insecurity: No Food Insecurity   Worried About Charity fundraiser in the Last Year: Never true   Arboriculturist in the Last Year: Never true  Transportation Needs: No Transportation Needs   Lack of Transportation (Medical): No   Lack of Transportation (Non-Medical): No  Physical Activity: Sufficiently Active   Days of Exercise per Week: 7 days   Minutes of Exercise per Session: 30 min  Stress: No Stress Concern Present   Feeling of Stress : Not at all  Social Connections: Not on file   Additional Social History:    Allergies:   Allergies  Allergen Reactions   Penicillin G Hives   Sulfa Antibiotics Hives   Tiotropium    Zoloft [Sertraline Hcl] Other (See Comments)    Labs:  Results for orders placed or performed during the hospital encounter of 03/03/21 (from the past 48 hour(s))  Resp Panel by RT-PCR (Flu A&B, Covid) Nasopharyngeal Swab     Status: None   Collection Time: 03/03/21  2:13 AM   Specimen: Nasopharyngeal Swab; Nasopharyngeal(NP) swabs in vial transport medium  Result Value Ref Range   SARS Coronavirus 2 by RT PCR NEGATIVE NEGATIVE    Comment:  (NOTE) SARS-CoV-2 target nucleic acids are NOT DETECTED.  The SARS-CoV-2 RNA is generally detectable in upper respiratory specimens during the acute phase of infection. The lowest concentration of SARS-CoV-2 viral copies this assay can detect is 138 copies/mL. A negative result does not preclude SARS-Cov-2 infection and should not be used as the sole basis for treatment or other patient management decisions. A negative result may occur with  improper specimen collection/handling, submission of specimen other than nasopharyngeal swab, presence of viral mutation(s) within the areas targeted by this assay, and inadequate number of viral copies(<138 copies/mL). A negative result must be combined with clinical observations, patient history, and epidemiological information. The expected result is Negative.  Fact Sheet for Patients:  EntrepreneurPulse.com.au  Fact Sheet for Healthcare Providers:  IncredibleEmployment.be  This test is no t yet approved or cleared by the [place] FDA and  has been authorized for detection and/or diagnosis of SARS-CoV-2 by FDA under an Emergency Use Authorization (EUA). This EUA will remain  in effect (meaning this test can be used) for the duration of the COVID-19 declaration under Section 564(b)(1) of the Act, 21 U.S.C.section 360bbb-3(b)(1), unless the authorization is terminated  or revoked sooner.       Influenza A by PCR NEGATIVE NEGATIVE   Influenza B by PCR NEGATIVE NEGATIVE    Comment: (NOTE) The Xpert Xpress SARS-CoV-2/FLU/RSV plus assay is intended as an aid in the diagnosis of influenza from Nasopharyngeal swab specimens and should not be used as a sole basis for treatment. Nasal washings and aspirates are unacceptable for Xpert Xpress SARS-CoV-2/FLU/RSV testing.  Fact Sheet for Patients: EntrepreneurPulse.com.au  Fact Sheet for Healthcare  Providers: IncredibleEmployment.be  This test is not yet approved or cleared by the [place] FDA and has been authorized for detection and/or diagnosis of SARS-CoV-2 by FDA under an Emergency Use Authorization (EUA). This EUA will remain in effect (meaning this test can be used) for the duration of the COVID-19 declaration under Section  564(b)(1) of the Act, 21 U.S.C. section 360bbb-3(b)(1), unless the authorization is terminated or revoked.  Performed at [facility], [address] [place], [place] [address]   CBC with Differential/Platelet     Status: Abnormal   Collection Time: 03/03/21  4:20 AM  Result Value Ref Range   WBC 9.3 4.0 - 10.5 K/uL   RBC 5.18 4.22 - 5.81 MIL/uL   Hemoglobin 14.8 13.0 - 17.0 g/dL   HCT 46.4 39.0 - 52.0 %   MCV 89.6 80.0 - 100.0 fL   MCH 28.6 26.0 - 34.0 pg   MCHC 31.9 30.0 - 36.0 g/dL   RDW 15.6 (H) 11.5 - 15.5 %   Platelets 230 150 - 400 K/uL   nRBC 0.0 0.0 - 0.2 %   Neutrophils Relative % 77 %   Neutro Abs 7.1 1.7 - 7.7 K/uL   Lymphocytes Relative 13 %   Lymphs Abs 1.2 0.7 - 4.0 K/uL   Monocytes Relative 8 %   Monocytes Absolute 0.8 0.1 - 1.0 K/uL   Eosinophils Relative 2 %   Eosinophils Absolute 0.2 0.0 - 0.5 K/uL   Basophils Relative 0 %   Basophils Absolute 0.0 0.0 - 0.1 K/uL   Immature Granulocytes 0 %   Abs Immature Granulocytes 0.03 0.00 - 0.07 K/uL    Comment: Performed at [facility], [address] [place], [place] [address]  Comprehensive metabolic panel     Status: Abnormal   Collection Time: 03/03/21  4:20 AM  Result Value Ref Range   Sodium 138 135 - 145 mmol/L   Potassium 3.2 (L) 3.5 - 5.1 mmol/L   Chloride 104 98 - 111 mmol/L   CO2 23 22 - 32 mmol/L   Glucose, Bld 119 (H) 70 - 99 mg/dL    Comment: Glucose reference range applies only to samples taken after fasting for at least 8 hours.   BUN 16 8 - 23 mg/dL   Creatinine, Ser 0.98 0.61 - 1.24 mg/dL   Calcium 8.9 8.9 - 10.3  mg/dL   Total Protein 7.6 6.5 - 8.1 g/dL   Albumin 3.9 3.5 - 5.0 g/dL   AST 17 15 - 41 U/L   ALT 16 0 - 44 U/L   Alkaline Phosphatase 81 38 - 126 U/L   Total Bilirubin 1.4 (H) 0.3 - 1.2 mg/dL   GFR, Estimated >60 >60 mL/min    Comment: (NOTE) Calculated using the CKD-EPI Creatinine Equation (2021)    Anion gap 11 5 - 15    Comment: Performed at [facility], [place], [place] [address]  Troponin I (High Sensitivity)     Status: Abnormal   Collection Time: 03/03/21  4:20 AM  Result Value Ref Range   Troponin I (High Sensitivity) 20 (H) <18 ng/L    Comment: (NOTE) Elevated high sensitivity troponin I (hsTnI) values and significant  changes across serial measurements may suggest ACS but many other  chronic and acute conditions are known to elevate hsTnI results.  Refer to the "Links" section for chest pain algorithms and additional  guidance. Performed at [facility], [address] [place], [place] [address]   Brain natriuretic peptide     Status: Abnormal   Collection Time: 03/03/21  4:20 AM  Result Value Ref Range   B Natriuretic Peptide 169.5 (H) 0.0 - 100.0 pg/mL    Comment: Performed at [facility], [address], [place], [place] [address]  Acetaminophen level  Status: Abnormal   Collection Time: 03/03/21  4:20 AM  Result Value Ref Range   Acetaminophen (Tylenol), Serum <10 (L) 10 - 30 ug/mL    Comment: (NOTE) Therapeutic concentrations vary significantly. A range of 10-30 ug/mL  may be an effective concentration for many patients. However, some  are best treated at concentrations outside of this range. Acetaminophen concentrations >150 ug/mL at 4 hours after ingestion  and >50 ug/mL at 12 hours after ingestion are often associated with  toxic reactions.  Performed at [facility], [address] [place], [place] [address]   Salicylate level     Status: Abnormal   Collection Time: 03/03/21  4:20 AM   Result Value Ref Range   Salicylate Lvl <0.3 (L) 7.0 - 30.0 mg/dL    Comment: Performed at [facility], [address] [place], [place] [address]  Ethanol     Status: None   Collection Time: 03/03/21  4:20 AM  Result Value Ref Range   Alcohol, Ethyl (B) <10 <10 mg/dL    Comment: (NOTE) Lowest detectable limit for serum alcohol is 10 mg/dL.  For medical purposes only. Performed at [facility], [address], [place], [place] [address]   Urine Drug Screen, Qualitative [facility] only)     Status: None   Collection Time: 03/03/21  4:20 AM  Result Value Ref Range   Tricyclic, Ur Screen NONE DETECTED NONE DETECTED   Amphetamines, Ur Screen NONE DETECTED NONE DETECTED   MDMA (Ecstasy)Ur Screen NONE DETECTED NONE DETECTED   Cocaine Metabolite,Ur [place] NONE DETECTED NONE DETECTED   Opiate, Ur Screen NONE DETECTED NONE DETECTED   Phencyclidine (PCP) Ur S NONE DETECTED NONE DETECTED   Cannabinoid 50 Ng, Ur [place] NONE DETECTED NONE DETECTED   Barbiturates, Ur Screen NONE DETECTED NONE DETECTED   Benzodiazepine, Ur Scrn NONE DETECTED NONE DETECTED   Methadone Scn, Ur NONE DETECTED NONE DETECTED    Comment: (NOTE) Tricyclics + metabolites, urine    Cutoff 1000 ng/mL Amphetamines + metabolites, urine  Cutoff 1000 ng/mL MDMA (Ecstasy), urine              Cutoff 500 ng/mL Cocaine Metabolite, urine          Cutoff 300 ng/mL Opiate + metabolites, urine        Cutoff 300 ng/mL Phencyclidine (PCP), urine         Cutoff 25 ng/mL Cannabinoid, urine                 Cutoff 50 ng/mL Barbiturates + metabolites, urine  Cutoff 200 ng/mL Benzodiazepine, urine              Cutoff 200 ng/mL Methadone, urine                   Cutoff 300 ng/mL  The urine drug screen provides only a preliminary, unconfirmed analytical test result and should not be used for non-medical purposes. Clinical consideration and professional judgment should be applied to any positive drug screen result due to  possible interfering substances. A more specific alternate chemical method must be used in order to obtain a confirmed analytical result. Gas chromatography / mass spectrometry (GC/MS) is the preferred confirm atory method. Performed at [name], [place], [place] [address]     Current Facility-Administered Medications  Medication Dose Route Frequency Provider Last Rate Last Admin   albuterol (PROVENTIL) (2.5 MG/3ML) 0.083% nebulizer solution 3 mL  3 mL Inhalation Q4H PRN  Ward, Delice Bison, DO       aspirin chewable tablet 81 mg  81 mg Oral Daily Ward, Kristen N, DO   81 mg at 03/03/21 0946   atorvastatin (LIPITOR) tablet 80 mg  80 mg Oral Daily Ward, Kristen N, DO   80 mg at 03/03/21 2841   empagliflozin (JARDIANCE) tablet 10 mg  10 mg Oral Daily Ward, Kristen N, DO       ezetimibe (ZETIA) tablet 10 mg  10 mg Oral Daily Ward, Kristen N, DO       furosemide (LASIX) tablet 40 mg  40 mg Oral QHS Ward, Kristen N, DO       furosemide (LASIX) tablet 80 mg  80 mg Oral Q breakfast Ward, Kristen N, DO       irbesartan (AVAPRO) tablet 37.5 mg  37.5 mg Oral Daily Ward, Kristen N, DO       metoprolol succinate (TOPROL-XL) 24 hr tablet 50 mg  50 mg Oral Daily Ward, Kristen N, DO   50 mg at 03/03/21 0946   mometasone-formoterol (DULERA) 100-5 MCG/ACT inhaler 2 puff  2 puff Inhalation BID Ward, Kristen N, DO       Omega 3 500 CAPS 500 mg  500 mg Oral Daily Ward, Kristen N, DO       pantoprazole (PROTONIX) EC tablet 40 mg  40 mg Oral Daily Ward, Kristen N, DO   40 mg at 03/03/21 0946   risperiDONE (RISPERDAL) tablet 0.5 mg  0.5 mg Oral BID Ward, Kristen N, DO   0.5 mg at 03/03/21 3244   rivaroxaban (XARELTO) tablet 20 mg  20 mg Oral QHS Ward, Delice Bison, DO       Current Outpatient Medications  Medication Sig Dispense Refill   albuterol (VENTOLIN HFA) 108 (90 Base) MCG/ACT inhaler Inhale 2 puffs into the lungs every 4 (four) hours as needed for wheezing or shortness of breath.  1 each 0   predniSONE (DELTASONE) 20 MG tablet Take 3 tablets (60 mg total) by mouth daily. Start on 03/04/21 12 tablet 0   acetaminophen (TYLENOL) 500 MG tablet Take 500 mg by mouth every 6 (six) hours as needed.     aspirin 81 MG chewable tablet Chew 1 tablet (81 mg total) by mouth daily. 90 tablet 4   atorvastatin (LIPITOR) 80 MG tablet Take 1 tablet (80 mg total) by mouth daily. 90 tablet 4   Blood Glucose Monitoring Suppl (ONETOUCH VERIO) w/Device KIT Use to check blood sugar 3 to 4 times a day and document.  Please bring to visits for review. 1 kit 0   budesonide-formoterol (SYMBICORT) 80-4.5 MCG/ACT inhaler Inhale 2 puffs into the lungs 2 (two) times daily. 1 each 4   Cholecalciferol 125 MCG (5000 UT) TABS Take 1 tablet (5,000 Units total) by mouth daily. 90 tablet 4   Elastic Bandages & Supports (MEDICAL COMPRESSION STOCKINGS) MISC 1 Package by Does not apply route daily. 6 each 6   empagliflozin (JARDIANCE) 10 MG TABS tablet Take 1 tablet (10 mg total) by mouth daily. 90 tablet 4   ezetimibe (ZETIA) 10 MG tablet Take 1 tablet (10 mg total) by mouth daily. 90 tablet 3   furosemide (LASIX) 40 MG tablet TAKE 2 TABLETS BY MOUTH ONCE EVERY MORNING AND 1 TABLET ONCE EVERY EVENING 270 tablet 0   glucose blood (ONETOUCH VERIO) test strip Use to check blood sugar 3 to 4 times a day. 100 each 12   metoprolol succinate (TOPROL XL) 50 MG  24 hr tablet Take 1 tablet (50 mg total) by mouth daily. 90 tablet 4   MODERNA COVID-19 VACCINE 100 MCG/0.5ML injection      nitroGLYCERIN (NITROSTAT) 0.4 MG SL tablet Place 1 tablet (0.4 mg total) under the tongue every 5 (five) minutes as needed for chest pain. 120 tablet 1   olmesartan (BENICAR) 5 MG tablet Take 2 tablets (10 mg total) by mouth daily. 180 tablet 4   Omega-3 Fatty Acids (OMEGA 3 500 PO) Take 500 mg by mouth daily.     OneTouch Delica Lancets 56L MISC Use to check blood sugar 2-3 times a day. 100 each 5   pantoprazole (PROTONIX) 40 MG tablet Take 1  tablet (40 mg total) by mouth daily. 90 tablet 4   risperiDONE (RISPERDAL) 0.5 MG tablet Take 1 tablet (0.5 mg total) by mouth 2 (two) times daily. 180 tablet 4   rivaroxaban (XARELTO) 20 MG TABS tablet Take 1 tablet (20 mg total) by mouth at bedtime. 90 tablet 4   sharps container 1 each by Does not apply route as needed. 1 each 1   Skin Protectants, Misc. (EUCERIN) cream Apply topically as needed for dry skin. 454 g 0   triamcinolone cream (KENALOG) 0.1 % APPLY TO AFFECTED AREA(s) TWICE DAILY 30 g 0   vitamin B-12 (CYANOCOBALAMIN) 1000 MCG tablet Take 1 tablet (1,000 mcg total) by mouth daily. 90 tablet 4    Musculoskeletal: Strength & Muscle Tone: within normal limits Gait & Station: normal Patient leans: N/A            Psychiatric Specialty Exam:  Presentation  General Appearance: Disheveled  Eye Contact:Fair  Speech:Clear and Coherent  Speech Volume:Normal  Handedness:Right   Mood and Affect  Mood:Anxious  Affect:Congruent   Thought Process  Thought Processes:Coherent  Descriptions of Associations:Loose  Orientation:Full (Time, Place and Person)  Thought Content:Delusions  History of Schizophrenia/Schizoaffective disorder:No  Duration of Psychotic Symptoms:No data recorded Hallucinations:Hallucinations: None  Ideas of Reference:None  Suicidal Thoughts:Suicidal Thoughts: No  Homicidal Thoughts:Homicidal Thoughts: No   Sensorium  Memory:Immediate Good; Recent Good; Remote Good  Judgment:Fair  Insight:Fair   Executive Functions  Concentration:Fair  Attention Span:Fair  [place]   Psychomotor Activity  Psychomotor Activity:Psychomotor Activity: Normal   Assets  Assets:Communication Skills; Resilience; Social Support; Desire for Improvement; Intimacy   Sleep  Sleep:Sleep: Good Number of Hours of Sleep: 7   Physical Exam: Physical Exam Vitals and nursing note reviewed.   Constitutional:      Appearance: Normal appearance.  HENT:     Head: Normocephalic and atraumatic.     Mouth/Throat:     Pharynx: Oropharynx is clear.  Eyes:     Pupils: Pupils are equal, round, and reactive to light.  Cardiovascular:     Rate and Rhythm: Normal rate and regular rhythm.  Pulmonary:     Effort: Pulmonary effort is normal.     Breath sounds: Normal breath sounds.  Abdominal:     General: Abdomen is flat.     Palpations: Abdomen is soft.  Musculoskeletal:        General: Normal range of motion.  Skin:    General: Skin is warm and dry.  Neurological:     General: No focal deficit present.     Mental Status: He is alert. Mental status is at baseline.  Psychiatric:        Attention and Perception: Attention normal.        Mood and Affect:  Mood is anxious.        Speech: Speech normal.        Behavior: Behavior is cooperative.        Thought Content: Thought content is delusional. Thought content does not include homicidal or suicidal ideation.        Cognition and Memory: Memory is impaired.        Judgment: Judgment normal.   Review of Systems  Constitutional: Negative.   HENT: Negative.    Eyes: Negative.   Respiratory:  Positive for shortness of breath.   Cardiovascular: Negative.   Gastrointestinal: Negative.   Musculoskeletal: Negative.   Skin: Negative.   Neurological: Negative.   Psychiatric/Behavioral: Negative.    Blood pressure (!) 146/92, pulse 100, temperature 98.2 F (36.8 C), temperature source Oral, resp. rate 18, height _0  (1.651 m), weight 103.4 kg, SpO2 97 %. Body mass index is 37.94 kg/m.  Treatment Plan Summary: Plan 65 year old man who appears to have chronic grandiose beliefs.  Without testing them it is hard to know just how definitely delusional they are.  It is certainly conceivable that he is related somehow to other famous people named [place] but he does not seem to be doing anything about it.  It is also conceivable he  might have met Ivin Booty at some point and just developed a longer term belief about it but again does not seem to be engaged in any dangerous or stalking or threatening behavior.  No grounds that I can see for inpatient hospitalization.  Discontinued IVC.  Confirmed with patient that he is taking care of his medical care and continues to allow an in-home worker to come and help him for a couple hours every day.  No new prescriptions written.  I saw that the emergency room physician last night had recommended a prednisone taper although this seems to probably be unnecessary given that no wheezing was appreciated and the patient does not seem to be in any respiratory distress.  Particularly given the high risk that prednisone may make any symptoms worse.  I have reviewed this with the ER physician and we agreed to discontinue the recommendation for prednisone.  Patient can be released from the emergency room once IVC is discontinued.  Disposition: No evidence of imminent risk to self or others at present.   Patient does not meet criteria for psychiatric inpatient admission. Supportive therapy provided about ongoing stressors. Discussed crisis plan, support from social network, calling 911, coming to the Emergency Department, and calling Suicide Hotline.  Alethia Berthold, MD 03/03/2021 11:22 AM

## 2021-03-03 NOTE — Consult Note (Signed)
[facility] Face-to-Face Psychiatry Consult   Reason for Consult: Nasal Congestion Referring Physician: Dr. Leonides Schanz Patient Identification: Evan [name] MRN:  [id number] Principal Diagnosis: <principal problem not specified> Diagnosis:  Active Problems:   Anxiety   Type 2 diabetes mellitus with morbid obesity (HCC)   Persistent atrial fibrillation (HCC)   Hoarding disorder   COPD, mild ([place])   Atherosclerosis of aorta (HCC)   Stenosis of right carotid artery   Total Time spent with patient: 45 minutes  Subjective: "This is very embarrassing to call someone and ask them about me."   Evan [name] is a 65 y.o. male patient presented to [facility] ED via EMS was voluntarily placed under Involuntarily Commitment status (IVC). The patient is delusional, stating he is related to the [place] in [place]. 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 The patient voiced that he is in contact with several movie stars.   The patient was seen face-to-face by this provider; the chart was reviewed and consulted with Dr. Leonides Schanz on 03/03/2021 due to the patient's care. It was discussed with the EDP that the patient remained under observation overnight and will be reassessed in the a.m. to determine if he meets the criteria for psychiatric inpatient admission; he could be discharged home.  On evaluation, the patient is alert and oriented x 4, calm, cooperative, and mood-congruent with affect. The patient does not appear to be responding to internal or external stimuli. The patient is presenting with some delusional thinking. The patient denies auditory or visual hallucinations. The patient denies any suicidal, homicidal, or self-harm ideations. The patient is not presenting with any psychotic or paranoid behaviors. During an encounter with the patient, he could answer questions appropriately.  HPI:  Per Dr. Leonides Schanz, Evan [name] is a 66 y.o. male with history of CAD, CHF,  hypertension, diabetes, hyperlipidemia, CVA who presents to the emergency department complaints of nasal congestion, cough and wheezing for the past couple of days.  States he was feeling short of breath but this is resolved.  No chest pain or chest discomfort.  States he is also had vomiting and diarrhea. 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  He tells me that he is related to [place] and Chrissie Noa and that they are his third cousins.  He also shows me a video of Facebook live where he states [name] Paisley's wife was talking to him.  He denies SI, HI.  Past Psychiatric History:  Anxiety disorder Stroke [facility]) PTSD (posttraumatic stress disorder)  Risk to Self:   Risk to Others:   Prior Inpatient Therapy:   Prior Outpatient Therapy:    Past Medical History:  Past Medical History:  Diagnosis Date   Anginal pain ([place])    Anxiety disorder    Asthma    Atresia of esophagus without fistula    CAD (coronary artery disease)    Cellulitis    CHF (congestive heart failure) (HCC)    NYHA CLASS III,CHRONIC,DIASTOLIC   COPD (chronic obstructive pulmonary disease) ([place])    COVID-19    Diabetes mellitus without complication (HCC)    Edema    RIGHT LOWER LEG   Gastroesophageal reflux    H/O: GI bleed    History of pneumonia    Remote   History of scarlet fever    Childhood  Hyperlipidemia    Hypertension    Myocardial infarction ([place]) 2009   Obesity    Obstructive sleep apnea    Pain    CHRONIC BACK / ABDOMINAL   Panic disorder    Peripheral venous insufficiency    PTSD (post-traumatic stress disorder)    Retinopathy    DIABETIC   Stasis, venous    Stroke [facility])    Vertigo     Past Surgical History:  Procedure Laterality Date   CARDIAC CATHETERIZATION     CATARACT EXTRACTION Left    CATARACT EXTRACTION W/PHACO Right 05/03/2017   Procedure: CATARACT  EXTRACTION PHACO AND INTRAOCULAR LENS PLACEMENT ([place]);  Surgeon: Leandrew Koyanagi, MD;  Location: [facility] ORS;  Service: Ophthalmology;  Laterality: Right;  [place] 00:35.3 AP% 12.3 CDE 4.33 Fluid Pack lot # [id number]        COLONOSCOPY WITH PROPOFOL N/A 11/26/2020   Procedure: COLONOSCOPY WITH PROPOFOL;  Surgeon: Jonathon Bellows, MD;  Location: [facility] ENDOSCOPY;  Service: Gastroenterology;  Laterality: N/A;  ANNETTE TO PICK UP [phone]   CORONARY ANGIOPLASTY WITH STENT PLACEMENT  2002   CORONARY ANGIOPLASTY WITH STENT PLACEMENT  1999   CORONARY ARTERY BYPASS GRAFT     x7   ESOPHAGOGASTRODUODENOSCOPY N/A 09/19/2016   Procedure: ESOPHAGOGASTRODUODENOSCOPY (EGD);  Surgeon: Lollie Sails, MD;  Location: [facility] ENDOSCOPY;  Service: Endoscopy;  Laterality: N/A;   ESOPHAGOGASTRODUODENOSCOPY (EGD) WITH PROPOFOL  11/26/2020   Procedure: ESOPHAGOGASTRODUODENOSCOPY (EGD) WITH PROPOFOL;  Surgeon: Jonathon Bellows, MD;  Location: [facility] ENDOSCOPY;  Service: Gastroenterology;;   Family History:  Family History  Problem Relation Age of Onset   Heart attack Mother    Hypertension Mother    Hyperlipidemia Mother    Heart attack Brother 89       MI   Coronary artery disease Other    Family Psychiatric  History:  Social History:  Social History   Substance and Sexual Activity  Alcohol Use No     Social History   Substance and Sexual Activity  Drug Use No    Social History   Socioeconomic History   Marital status: Single    Spouse name: Not on file   Number of children: Not on file   Years of education: Not on file   Highest education level: Not on file  Occupational History    Comment: disabled  Tobacco Use   Smoking status: Never   Smokeless tobacco: Never  Vaping Use   Vaping Use: Never used  Substance and Sexual Activity   Alcohol use: No   Drug use: No   Sexual activity: Not Currently  Other Topics Concern   Not on file  Social History Narrative   Disabled   Single   Social  Determinants of Health   Financial Resource Strain: Low Risk    Difficulty of Paying Living Expenses: Not hard at all  Food Insecurity: No Food Insecurity   Worried About Charity fundraiser in the Last Year: Never true   [place] in the Last Year: Never true  Transportation Needs: No Transportation Needs   Lack of Transportation (Medical): No   Lack of Transportation (Non-Medical): No  Physical Activity: Sufficiently Active   Days of Exercise per Week: 7 days   Minutes of Exercise per Session: 30 min  Stress: No Stress Concern Present   Feeling of Stress : Not at all  Social Connections: Not on file   Additional Social History:    Allergies:   Allergies  Allergen Reactions  Penicillin G Hives   Sulfa Antibiotics Hives   Tiotropium    Zoloft [Sertraline Hcl] Other (See Comments)    Labs:  Results for orders placed or performed during the hospital encounter of 03/03/21 (from the past 48 hour(s))  Resp Panel by RT-PCR (Flu A&B, Covid) Nasopharyngeal Swab     Status: None   Collection Time: 03/03/21  2:13 AM   Specimen: Nasopharyngeal Swab; Nasopharyngeal(NP) swabs in vial transport medium  Result Value Ref Range   SARS Coronavirus 2 by RT PCR NEGATIVE NEGATIVE    Comment: (NOTE) SARS-CoV-2 target nucleic acids are NOT DETECTED.  The SARS-CoV-2 RNA is generally detectable in upper respiratory specimens during the acute phase of infection. The lowest concentration of SARS-CoV-2 viral copies this assay can detect is 138 copies/mL. A negative result does not preclude SARS-Cov-2 infection and should not be used as the sole basis for treatment or other patient management decisions. A negative result may occur with  improper specimen collection/handling, submission of specimen other than nasopharyngeal swab, presence of viral mutation(s) within the areas targeted by this assay, and inadequate number of viral copies(<138 copies/mL). A negative result must be combined  with clinical observations, patient history, and epidemiological information. The expected result is Negative.  Fact Sheet for Patients:  EntrepreneurPulse.com.au  Fact Sheet for Healthcare Providers:  IncredibleEmployment.be  This test is no t yet approved or cleared by the [place] FDA and  has been authorized for detection and/or diagnosis of SARS-CoV-2 by FDA under an Emergency Use Authorization (EUA). This EUA will remain  in effect (meaning this test can be used) for the duration of the COVID-19 declaration under Section 564(b)(1) of the Act, 21 U.S.C.section 360bbb-3(b)(1), unless the authorization is terminated  or revoked sooner.       Influenza A by PCR NEGATIVE NEGATIVE   Influenza B by PCR NEGATIVE NEGATIVE    Comment: (NOTE) The Xpert Xpress SARS-CoV-2/FLU/RSV plus assay is intended as an aid in the diagnosis of influenza from Nasopharyngeal swab specimens and should not be used as a sole basis for treatment. Nasal washings and aspirates are unacceptable for Xpert Xpress SARS-CoV-2/FLU/RSV testing.  Fact Sheet for Patients: EntrepreneurPulse.com.au  Fact Sheet for Healthcare Providers: IncredibleEmployment.be  This test is not yet approved or cleared by the [place] FDA and has been authorized for detection and/or diagnosis of SARS-CoV-2 by FDA under an Emergency Use Authorization (EUA). This EUA will remain in effect (meaning this test can be used) for the duration of the COVID-19 declaration under Section 564(b)(1) of the Act, 21 U.S.C. section 360bbb-3(b)(1), unless the authorization is terminated or revoked.  Performed at [facility], [address] [place], [place] [address]   CBC with Differential/Platelet     Status: Abnormal   Collection Time: 03/03/21  4:20 AM  Result Value Ref Range   WBC 9.3 4.0 - 10.5 K/uL   RBC 5.18 4.22 - 5.81 MIL/uL   Hemoglobin  14.8 13.0 - 17.0 g/dL   HCT 46.4 39.0 - 52.0 %   MCV 89.6 80.0 - 100.0 fL   MCH 28.6 26.0 - 34.0 pg   MCHC 31.9 30.0 - 36.0 g/dL   RDW 15.6 (H) 11.5 - 15.5 %   Platelets 230 150 - 400 K/uL   nRBC 0.0 0.0 - 0.2 %   Neutrophils Relative % 77 %   Neutro Abs 7.1 1.7 - 7.7 K/uL   Lymphocytes Relative 13 %   Lymphs Abs 1.2 0.7 - 4.0 K/uL  Monocytes Relative 8 %   Monocytes Absolute 0.8 0.1 - 1.0 K/uL   Eosinophils Relative 2 %   Eosinophils Absolute 0.2 0.0 - 0.5 K/uL   Basophils Relative 0 %   Basophils Absolute 0.0 0.0 - 0.1 K/uL   Immature Granulocytes 0 %   Abs Immature Granulocytes 0.03 0.00 - 0.07 K/uL    Comment: Performed at [facility], [address] [place], [place] [address]  Comprehensive metabolic panel     Status: Abnormal   Collection Time: 03/03/21  4:20 AM  Result Value Ref Range   Sodium 138 135 - 145 mmol/L   Potassium 3.2 (L) 3.5 - 5.1 mmol/L   Chloride 104 98 - 111 mmol/L   CO2 23 22 - 32 mmol/L   Glucose, Bld 119 (H) 70 - 99 mg/dL    Comment: Glucose reference range applies only to samples taken after fasting for at least 8 hours.   BUN 16 8 - 23 mg/dL   Creatinine, Ser 0.98 0.61 - 1.24 mg/dL   Calcium 8.9 8.9 - 10.3 mg/dL   Total Protein 7.6 6.5 - 8.1 g/dL   Albumin 3.9 3.5 - 5.0 g/dL   AST 17 15 - 41 U/L   ALT 16 0 - 44 U/L   Alkaline Phosphatase 81 38 - 126 U/L   Total Bilirubin 1.4 (H) 0.3 - 1.2 mg/dL   GFR, Estimated >60 >60 mL/min    Comment: (NOTE) Calculated using the CKD-EPI Creatinine Equation (2021)    Anion gap 11 5 - 15    Comment: Performed at [facility], [place], [place] [address]  Troponin I (High Sensitivity)     Status: Abnormal   Collection Time: 03/03/21  4:20 AM  Result Value Ref Range   Troponin I (High Sensitivity) 20 (H) <18 ng/L    Comment: (NOTE) Elevated high sensitivity troponin I (hsTnI) values and significant  changes across serial measurements may suggest ACS but many other   chronic and acute conditions are known to elevate hsTnI results.  Refer to the "Links" section for chest pain algorithms and additional  guidance. Performed at [facility], [address] [place], [place] [address]   Brain natriuretic peptide     Status: Abnormal   Collection Time: 03/03/21  4:20 AM  Result Value Ref Range   B Natriuretic Peptide 169.5 (H) 0.0 - 100.0 pg/mL    Comment: Performed at [facility], [address], [place], [place] [address]    Current Facility-Administered Medications  Medication Dose Route Frequency Provider Last Rate Last Admin   albuterol (PROVENTIL) (2.5 MG/3ML) 0.083% nebulizer solution 3 mL  3 mL Inhalation Q4H PRN Ward, Kristen N, DO       predniSONE (DELTASONE) tablet 60 mg  60 mg Oral Q breakfast Ward, Kristen N, DO   60 mg at 03/03/21 0422   Current Outpatient Medications  Medication Sig Dispense Refill   acetaminophen (TYLENOL) 500 MG tablet Take 500 mg by mouth every 6 (six) hours as needed.     aspirin 81 MG chewable tablet Chew 1 tablet (81 mg total) by mouth daily. 90 tablet 4   atorvastatin (LIPITOR) 80 MG tablet Take 1 tablet (80 mg total) by mouth daily. 90 tablet 4   Blood Glucose Monitoring Suppl (ONETOUCH VERIO) w/Device KIT Use to check blood sugar 3 to 4 times a day and document.  Please bring to visits for review. 1 kit 0   budesonide-formoterol (SYMBICORT) 80-4.5  MCG/ACT inhaler Inhale 2 puffs into the lungs 2 (two) times daily. 1 each 4   Cholecalciferol 125 MCG (5000 UT) TABS Take 1 tablet (5,000 Units total) by mouth daily. 90 tablet 4   Elastic Bandages & Supports (MEDICAL COMPRESSION STOCKINGS) MISC 1 Package by Does not apply route daily. 6 each 6   empagliflozin (JARDIANCE) 10 MG TABS tablet Take 1 tablet (10 mg total) by mouth daily. 90 tablet 4   ezetimibe (ZETIA) 10 MG tablet Take 1 tablet (10 mg total) by mouth daily. 90 tablet 3   furosemide (LASIX) 40 MG tablet TAKE 2 TABLETS BY MOUTH ONCE EVERY  MORNING AND 1 TABLET ONCE EVERY EVENING 270 tablet 0   glucose blood (ONETOUCH VERIO) test strip Use to check blood sugar 3 to 4 times a day. 100 each 12   metoprolol succinate (TOPROL XL) 50 MG 24 hr tablet Take 1 tablet (50 mg total) by mouth daily. 90 tablet 4   MODERNA COVID-19 VACCINE 100 MCG/0.5ML injection      nitroGLYCERIN (NITROSTAT) 0.4 MG SL tablet Place 1 tablet (0.4 mg total) under the tongue every 5 (five) minutes as needed for chest pain. 120 tablet 1   olmesartan (BENICAR) 5 MG tablet Take 2 tablets (10 mg total) by mouth daily. 180 tablet 4   Omega-3 Fatty Acids (OMEGA 3 500 PO) Take 500 mg by mouth daily.     OneTouch Delica Lancets 54T MISC Use to check blood sugar 2-3 times a day. 100 each 5   pantoprazole (PROTONIX) 40 MG tablet Take 1 tablet (40 mg total) by mouth daily. 90 tablet 4   risperiDONE (RISPERDAL) 0.5 MG tablet Take 1 tablet (0.5 mg total) by mouth 2 (two) times daily. 180 tablet 4   rivaroxaban (XARELTO) 20 MG TABS tablet Take 1 tablet (20 mg total) by mouth at bedtime. 90 tablet 4   sharps container 1 each by Does not apply route as needed. 1 each 1   Skin Protectants, Misc. (EUCERIN) cream Apply topically as needed for dry skin. 454 g 0   triamcinolone cream (KENALOG) 0.1 % APPLY TO AFFECTED AREA(s) TWICE DAILY 30 g 0   vitamin B-12 (CYANOCOBALAMIN) 1000 MCG tablet Take 1 tablet (1,000 mcg total) by mouth daily. 90 tablet 4    Musculoskeletal: Strength & Muscle Tone: within normal limits Gait & Station: normal Patient leans: N/A  Psychiatric Specialty Exam:  Presentation  General Appearance: Disheveled  Eye Contact:Fair  Speech:Clear and Coherent  Speech Volume:Normal  Handedness:Right   Mood and Affect  Mood:Anxious  Affect:Congruent   Thought Process  Thought Processes:Coherent  Descriptions of Associations:Loose  Orientation:Full (Time, Place and Person)  Thought Content:Delusions  History of Schizophrenia/Schizoaffective  disorder:No  Duration of Psychotic Symptoms:No data recorded Hallucinations:Hallucinations: None  Ideas of Reference:None  Suicidal Thoughts:Suicidal Thoughts: No  Homicidal Thoughts:Homicidal Thoughts: No   Sensorium  Memory:Immediate Good; Recent Good; Remote Good  Judgment:Fair  Insight:Fair   Executive Functions  Concentration:Fair  Attention Span:Fair  [place]   Psychomotor Activity  Psychomotor Activity:Psychomotor Activity: Normal   Assets  Assets:Communication Skills; Resilience; Social Support; Desire for Improvement; Intimacy   Sleep  Sleep:Sleep: Good Number of Hours of Sleep: 7   Physical Exam: Physical Exam Vitals and nursing note reviewed.  Constitutional:      Appearance: He is obese.  HENT:     Head: Normocephalic and atraumatic.     Nose: Nose normal.  Cardiovascular:     Rate  and Rhythm: Normal rate and regular rhythm.  Pulmonary:     Effort: Pulmonary effort is normal.  Musculoskeletal:        General: Normal range of motion.     Cervical back: Normal range of motion and neck supple.  Neurological:     Mental Status: He is alert and oriented to person, place, and time.  Psychiatric:        Attention and Perception: Attention and perception normal.        Mood and Affect: Mood and affect normal.        Speech: Speech normal.        Behavior: Behavior normal.        Thought Content: Thought content is delusional.        Cognition and Memory: Cognition and memory normal.        Judgment: Judgment normal.   ROS Blood pressure 128/74, pulse 88, temperature 98.2 F (36.8 C), temperature source Oral, resp. rate 18, height 5' 5"  (1.651 m), weight 103.4 kg, SpO2 95 %. Body mass index is 37.94 kg/m.  Treatment Plan Summary: Daily contact with patient to assess and evaluate symptoms and progress in treatment and Plan The patient remained under observation overnight and will be reassessed in  the a.m. to determine if he meets the criteria for psychiatric inpatient admission; he could be discharged home.  Disposition: Supportive therapy provided about ongoing stressors. The patient remained under observation overnight and will be reassessed in the a.m. to determine if he meets the criteria for psychiatric inpatient admission; he could be discharged home.  Caroline Sauger, NP 03/03/2021 5:25 AM

## 2021-03-04 ENCOUNTER — Telehealth: Payer: Self-pay

## 2021-03-04 NOTE — Telephone Encounter (Signed)
Copied from [place] [id number]. Topic: General - Other >> Mar 03, 2021  1:21 PM Valere Dross wrote: Reason for CRM: Pt called in wanting to let PCP know his experience with his ER visit 07/27, stating he was completely humiliated and embarrassed. Pt is worried about this false accusation being on his records, and does not like how he was treated, wanted to see if PCP could do something or take this off of his chart. Please advise.

## 2021-03-07 ENCOUNTER — Ambulatory Visit (INDEPENDENT_AMBULATORY_CARE_PROVIDER_SITE_OTHER): Payer: Medicare Other | Admitting: Licensed Clinical Social Worker

## 2021-03-07 DIAGNOSIS — E1159 Type 2 diabetes mellitus with other circulatory complications: Secondary | ICD-10-CM

## 2021-03-07 DIAGNOSIS — F419 Anxiety disorder, unspecified: Secondary | ICD-10-CM

## 2021-03-07 DIAGNOSIS — I152 Hypertension secondary to endocrine disorders: Secondary | ICD-10-CM | POA: Diagnosis not present

## 2021-03-07 DIAGNOSIS — F22 Delusional disorders: Secondary | ICD-10-CM

## 2021-03-07 DIAGNOSIS — J449 Chronic obstructive pulmonary disease, unspecified: Secondary | ICD-10-CM | POA: Diagnosis not present

## 2021-03-07 DIAGNOSIS — E1169 Type 2 diabetes mellitus with other specified complication: Secondary | ICD-10-CM

## 2021-03-07 DIAGNOSIS — Z8673 Personal history of transient ischemic attack (TIA), and cerebral infarction without residual deficits: Secondary | ICD-10-CM

## 2021-03-07 NOTE — Patient Instructions (Signed)
Visit Information   Goals Addressed               This Visit's Progress     Patient Stated     SW-"I need more help." (pt-stated)   On track     Patient Goals/Self-Care Activities: Over the next 120 days Attend all scheduled appointments with providers Contact office with any questions or concerns [facility] transportation number for upcoming Vascular appt - learn and use visualization or guided imagery - practice relaxation or meditation daily - practice positive thinking and self-talk I will coordinate with your doctor to assist with obtaining a new in-home aid      Other     SW-Track and Manage My Symptoms-Depression   On track     Timeframe:  Long-Range Goal Priority:  High Start Date:   08/09/20                         Expected End Date: 04/06/21                    Follow Up Date- 03/17/21  Patient Goals/Self-Care Activities: Over the next 120 days Attend all scheduled appointments with providers Contact office with any questions or concerns [facility] transportation number for upcoming Vascular appt - learn and use visualization or guided imagery - practice relaxation or meditation daily - practice positive thinking and self-talk I will coordinate with your doctor to assist with obtaining a new in-home aid        Patient verbalizes understanding of instructions provided today and agrees to view in [place].   Telephone follow up appointment with care management team member scheduled for:03/17/21  [name], MSW, [email or web address] Phone [phone] 3:21 PM

## 2021-03-07 NOTE — Telephone Encounter (Signed)
Spoke with patient and notified him of [name]'s recommendations. Patient was given the hospital contact information. Patient verbalized understanding and states he would reach out to the hospital.
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
## 2021-03-16 ENCOUNTER — Telehealth: Payer: Self-pay

## 2021-03-16 NOTE — Telephone Encounter (Signed)
Copied from [facility] [id number]. Topic: General - Other >> Mar 16, 2021  9:43 AM Celene Kras wrote: Reason for CRM: Pt called stating that he was able to get his housing together and wanted to tell PCP thank you. Please advise.

## 2021-03-17 ENCOUNTER — Ambulatory Visit: Payer: Medicare Other | Admitting: Licensed Clinical Social Worker

## 2021-03-17 DIAGNOSIS — I152 Hypertension secondary to endocrine disorders: Secondary | ICD-10-CM

## 2021-03-17 DIAGNOSIS — E1159 Type 2 diabetes mellitus with other circulatory complications: Secondary | ICD-10-CM

## 2021-03-17 DIAGNOSIS — Z8673 Personal history of transient ischemic attack (TIA), and cerebral infarction without residual deficits: Secondary | ICD-10-CM

## 2021-03-17 DIAGNOSIS — F419 Anxiety disorder, unspecified: Secondary | ICD-10-CM

## 2021-03-18 NOTE — Patient Instructions (Signed)
Visit Information   Goals Addressed               This Visit's Progress     Patient Stated     SW-"I need more help." (pt-stated)   On track     Patient Goals/Self-Care Activities: Over the next 120 days Attend all scheduled appointments with providers Contact office with any questions or concerns [facility] transportation number for upcoming Vascular appt - learn and use visualization or guided imagery - practice relaxation or meditation daily - practice positive thinking and self-talk I will coordinate with your doctor to assist with obtaining a new in-home aid      Other     SW-Track and Manage My Symptoms-Depression   On track     Timeframe:  Long-Range Goal Priority:  High Start Date:   08/09/20                         Expected End Date: 04/06/21                    Follow Up Date- 03/25/21  Patient Goals/Self-Care Activities: Over the next 120 days Attend all scheduled appointments with providers Contact office with any questions or concerns [facility] transportation number for upcoming Vascular appt - learn and use visualization or guided imagery - practice relaxation or meditation daily - practice positive thinking and self-talk I will coordinate with your doctor to assist with obtaining a new in-home aid        Patient verbalizes understanding of instructions provided today and agrees to view in [place].   Telephone follow up appointment with care management team member scheduled for:03/25/21  [name], MSW, [email or web address] Phone [phone] 10:24 AM
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
## 2021-03-22 ENCOUNTER — Ambulatory Visit: Payer: Medicare Other | Admitting: Cardiovascular Disease

## 2021-03-25 ENCOUNTER — Ambulatory Visit: Payer: Medicare Other | Admitting: Licensed Clinical Social Worker

## 2021-03-25 DIAGNOSIS — E1169 Type 2 diabetes mellitus with other specified complication: Secondary | ICD-10-CM

## 2021-03-25 DIAGNOSIS — F22 Delusional disorders: Secondary | ICD-10-CM

## 2021-03-25 DIAGNOSIS — F419 Anxiety disorder, unspecified: Secondary | ICD-10-CM

## 2021-03-25 DIAGNOSIS — E1159 Type 2 diabetes mellitus with other circulatory complications: Secondary | ICD-10-CM

## 2021-03-25 NOTE — Patient Instructions (Signed)
Visit Information   Goals Addressed               This Visit's Progress     Patient Stated     SW-"I need more help." (pt-stated)   On track     Patient Goals/Self-Care Activities: Over the next 120 days Attend all scheduled appointments with providers Contact office with any questions or concerns [facility] transportation number for upcoming Vascular appt - learn and use visualization or guided imagery - practice relaxation or meditation daily - practice positive thinking and self-talk I will coordinate with your doctor to assist with obtaining a new in-home aid      Other     SW-Track and Manage My Symptoms-Depression   On track     Timeframe:  Long-Range Goal Priority:  High Start Date:   08/09/20                         Expected End Date: 04/06/21                    Follow Up Date- 03/25/21  Patient Goals/Self-Care Activities: Over the next 120 days Attend all scheduled appointments with providers Contact office with any questions or concerns [facility] transportation number for upcoming Vascular appt - learn and use visualization or guided imagery - practice relaxation or meditation daily - practice positive thinking and self-talk I will coordinate with your doctor to assist with obtaining a new in-home aid        Patient verbalizes understanding of instructions provided today and agrees to view in [place].   Telephone follow up appointment with care management team member scheduled for:04/14/21  [name], MSW, [email or web address] Phone [phone] 10:46 AM

## 2021-03-25 NOTE — Chronic Care Management (AMB) (Signed)
Chronic Care Management    Clinical Social Work Note  03/25/2021 Name: Evan [name] MRN: [id number] DOB: 1956/02/03  Evan [name] is a 65 y.o. year old male who is a primary care patient of [name], [name], NP. The CCM team was consulted to assist the patient with chronic disease management and/or care coordination needs related to: Mental Health Counseling and Resources.   Engaged with patient by telephone for follow up visit in response to provider referral for social work chronic care management and care coordination services.   Consent to Services:  The patient was given information about Chronic Care Management services, agreed to services, and gave verbal consent prior to initiation of services.  Please see initial visit note for detailed documentation.   Patient agreed to services and consent obtained.   Consent to Services:  The patient was given information about Care Management services, agreed to services, and gave verbal consent prior to initiation of services.  Please see initial visit note for detailed documentation.   Patient agreed to services today and consent obtained.   Assessment: Engaged with patient by telephone in response to provider referral for social work care coordination services: [place] [facility].    Patient continues to maintain positive progress with care plan goals. Patient called EMS after experiencing a vertigo episode, states vitals were fine. CCM LCSW assisted with patient processing conflicting emotions and discussed continued practice of mindfulness strategies to manage anxiety symptoms. See Care Plan below for interventions and patient self-care actives.  Recent life changes or stressors: Management of health conditions  Recommendation: Patient may benefit from, and is in agreement work with LCSW to address care coordination needs and will continue to work with the clinical team to address health care and disease  management related needs.   Follow up Plan: Patient would like continued follow-up from CCM LCSW .  per patient's request will follow up in 04/14/21.  Will call office if needed prior to next encounter.    SDOH (Social Determinants of Health) assessments and interventions performed:    Advanced Directives Status: Not addressed in this encounter.  CCM Care Plan  Allergies  Allergen Reactions   Penicillin G Hives   Sulfa Antibiotics Hives   Tiotropium    Zoloft [Sertraline Hcl] Other (See Comments)    Outpatient Encounter Medications as of 03/25/2021  Medication Sig   acetaminophen (TYLENOL) 500 MG tablet Take 500 mg by mouth every 6 (six) hours as needed.   albuterol (VENTOLIN HFA) 108 (90 Base) MCG/ACT inhaler Inhale 2 puffs into the lungs every 4 (four) hours as needed for wheezing or shortness of breath.   aspirin 81 MG chewable tablet Chew 1 tablet (81 mg total) by mouth daily.   atorvastatin (LIPITOR) 80 MG tablet Take 1 tablet (80 mg total) by mouth daily.   Blood Glucose Monitoring Suppl (ONETOUCH VERIO) w/Device KIT Use to check blood sugar 3 to 4 times a day and document.  Please bring to visits for review.   budesonide-formoterol (SYMBICORT) 80-4.5 MCG/ACT inhaler Inhale 2 puffs into the lungs 2 (two) times daily.   Cholecalciferol 125 MCG (5000 UT) TABS Take 1 tablet (5,000 Units total) by mouth daily.   Elastic Bandages & Supports (MEDICAL COMPRESSION STOCKINGS) MISC 1 Package by Does not apply route daily.   empagliflozin (JARDIANCE) 10 MG TABS tablet Take 1 tablet (10 mg total) by mouth daily.   ezetimibe (ZETIA) 10 MG tablet Take 1 tablet (10 mg total) by mouth  daily.   furosemide (LASIX) 40 MG tablet TAKE 2 TABLETS BY MOUTH ONCE EVERY MORNING AND 1 TABLET ONCE EVERY EVENING   glucose blood (ONETOUCH VERIO) test strip Use to check blood sugar 3 to 4 times a day.   metoprolol succinate (TOPROL XL) 50 MG 24 hr tablet Take 1 tablet (50 mg total) by mouth daily.   MODERNA  COVID-19 VACCINE 100 MCG/0.5ML injection    nitroGLYCERIN (NITROSTAT) 0.4 MG SL tablet Place 1 tablet (0.4 mg total) under the tongue every 5 (five) minutes as needed for chest pain.   olmesartan (BENICAR) 5 MG tablet Take 2 tablets (10 mg total) by mouth daily.   Omega-3 Fatty Acids (OMEGA 3 500 PO) Take 500 mg by mouth daily.   OneTouch Delica Lancets 80K MISC Use to check blood sugar 2-3 times a day.   pantoprazole (PROTONIX) 40 MG tablet Take 1 tablet (40 mg total) by mouth daily.   predniSONE (DELTASONE) 20 MG tablet Take 3 tablets (60 mg total) by mouth daily. Start on 03/04/21   risperiDONE (RISPERDAL) 0.5 MG tablet Take 1 tablet (0.5 mg total) by mouth 2 (two) times daily.   rivaroxaban (XARELTO) 20 MG TABS tablet Take 1 tablet (20 mg total) by mouth at bedtime.   sharps container 1 each by Does not apply route as needed.   Skin Protectants, Misc. (EUCERIN) cream Apply topically as needed for dry skin.   triamcinolone cream (KENALOG) 0.1 % APPLY TO AFFECTED AREA(s) TWICE DAILY   vitamin B-12 (CYANOCOBALAMIN) 1000 MCG tablet Take 1 tablet (1,000 mcg total) by mouth daily.   No facility-administered encounter medications on file as of 03/25/2021.    Patient Active Problem List   Diagnosis Date Noted   Delusional disorder ([place]) 03/03/2021   Other thrombophilia ([place]) 12/25/2020   Vitamin D deficiency 12/04/2020   Vitamin B12 deficiency 12/04/2020   Stenosis of right carotid artery 12/03/2020   History of CVA (cerebrovascular accident) 11/06/2020   History of 2019 novel coronavirus disease (COVID-19) 09/27/2020   Atherosclerosis of aorta ([place]) 12/04/2019   Persistent atrial fibrillation ([place]) 09/12/2019   CAD (coronary artery disease) 09/12/2019   Chronic venous stasis 09/12/2019   Hoarding disorder 09/12/2019   Cervical spinal stenosis 09/12/2019   Diabetic retinopathy ([place]) 09/12/2019   COPD, mild ([place]) 09/12/2019   Osteoporosis 09/09/2019   History of prostate cancer 09/09/2019    Anxiety 08/10/2019   Obstructive sleep apnea on CPAP 08/10/2019   Hyperlipidemia associated with type 2 diabetes mellitus ([place]) 08/10/2019   Chronic diastolic CHF (congestive heart failure) ([place]) 01/09/2014   Esophageal dysmotility 09/12/2013   Type 2 diabetes mellitus with morbid obesity ([place]) 02/14/2012   Obesity, morbid (more than 100 lbs over ideal weight or BMI > 40) ([place]) 07/26/2011   OLD MYOCARDIAL INFARCTION 03/02/2010   Hypertension associated with diabetes ([place]) 03/20/2009   CORONARY ATHEROSCLEROSIS, ARTERY BYPASS GRAFT 03/20/2009    Conditions to be addressed/monitored: Anxiety; Mental Health Concerns   Care Plan : General Social Work (Adult)  Updates made by [name] D, LCSW since 03/25/2021 12:00 AM     Problem: Response to Treatment (Depression)      Goal: Response to Treatment Maximized   Start Date: 12/27/2020  This Visit's Progress: On track  Recent Progress: On track  Priority: High  Note:   Current barriers:   Acute Mental Health needs related to Anxiety Housing barriers, Level of care concerns, and Mental Health Concerns  Needs Support, Education, and Care Coordination in order  to meet unmet mental health needs. Clinical Goal(s): Over the next 120 days, patient will work with SW to reduce or manage symptoms of anxiety and increase knowledge and/or ability of: coping skills, healthy habits, self-management skills, and stress reduction.until connected for ongoing counseling.  Clinical Interventions:  Assessed patient's previous treatment, needs, coping skills, current treatment, support system and barriers to care Patient interviewed and appropriate assessments performed Provided mental health counseling with regard to managing anxiety Patient reports difficulty managing symptoms of anxiety triggered by chronic health conditions. States he experienced an episode of vertigo resulting EMS being called to residence to assess vitals. Patient is not interested  in visiting ED for additional testing Patient is upset about the way he is treated by multiple personal aids, stating that current aid is dishonest about why she does not show to work. CCM LCSW strongly encouraged patient to contact agency about his concerns to reach a resolution CCM LCSW discussed mindfulness strategies to assist with anxiety symptoms  Patient has scheduled transportation assistance through [employer] to run errands for next week. He is proud of this noting that he does not want to continue using aid for transportation needs Validation and encouragement was provided during call. CCM LCSW encouraged patient to continue reflecting on past experiences and current friendships to promote positive mood and stress management CCM LCSW provided a supportive environment to allow patient to process his emotions Patient reports that his strong spiritual relationship keeps him encouraged to move forward instead of focusing on past events  Patient is not interested in initiating psychiatry and/or counseling to assist with strengthening support system Patient reports becoming "stir crazy" from staying at home for majority of the day. CCM LCSW discussed different activities to participate in to assist with promoting socialization CCM LCSW discussed benefits of participating in [employer]. Patient reports hx of attending [facility]. States that he disenrolled because staff and participants believed he was "delusional" because he has close relationships with various celebrities CCM LCSW inquired about his interest with [employer]. Patient is not interested CCM LCSW discussed strategies to improve communication skills with aid to assist in preventing misunderstandings or offending others  CCM LCSW reviewed upcoming appointments with patient. He reports that he is utilizing [employer] for transportation. His aid has offered to bring patient to local stores to obtain cleaning supplies. Patient reports that  he is aware that he can also utilize [employer] for transportation to medical appointments CCM LCSW discussed strategies to assist patient with memory concerns Other interventions: Solution-Focused Strategies, Mindfulness or Psychologist, educational, Active listening / Reflection utilized , Emotional Supportive Provided, Psychoeducation for mental health needs , Motivational Interviewing, Participation in counseling encouraged , Participation in support group encouraged , Consideration of in-home help encouraged , and Verbalization of feelings encouraged   Discussed plans with patient for ongoing care management follow up and provided patient with direct contact information for care management team Collaboration with PCP regarding development and update of comprehensive plan of care as evidenced by provider attestation and co-signature Inter-disciplinary care team collaboration (see longitudinal plan of care) Patient Goals/Self-Care Activities: Over the next 120 days Attend all scheduled appointments with providers Contact office with any questions or concerns [facility] transportation number to assist with attending vascular appointment - learn and use visualization or guided imagery - practice relaxation or meditation daily - practice positive thinking and self-talk I will coordinate with your doctor to assist with obtaining a new in-home aid       [facility]  Kristopher Rumpf, MSW, [email or web address] Phone [phone] 10:41 AM

## 2021-04-01 ENCOUNTER — Telehealth: Payer: Self-pay

## 2021-04-01 NOTE — Progress Notes (Signed)
Chronic Care Management Pharmacy Assistant   Name: Evan [name]  MRN: [id number] DOB: 1955/09/04   Reason for Encounter: Disease State Cholesterol     Recent office visits:  12/03/20 - [name] up - Labs ordered - started Olmesartan 10 mg  follow up in 4 weeks  01/07/21 Evan T. [name] NP - Diabetes follow up - No medication changes noted - follow up in 2 months  02/08/21 - Evan T. [name] NP - Diabetes follow up - labs ordered - No medication changes noted - follow up in 3 months  Recent consult visits:  01/17/21 - Evan Rogue MD - Cardiology - ER follow up - no medication changes - no follow ups noted   Hospital visits:   Medication Reconciliation was completed by comparing discharge summary, patient's EMR and Pharmacy list, and upon discussion with patient.  Admitted to the hospital on 12/08/20 due to Dehydration. Discharge date was 12/08/20. Discharged from [place]?Medications Started at [facility] Discharge:?? N/a  Medication Changes at Hospital Discharge: N/a  Medications Discontinued at Hospital Discharge: N/a  Medications that remain the same after Hospital Discharge:??  -All other medications will remain the same.      Admitted to the hospital on 01/1121 due to Anxiety. Discharge date was 01/15/21. Discharged from [place]?Medications Started at [facility] Discharge:?? N/a  Medication Changes at Hospital Discharge: N/a  Medications Discontinued at Hospital Discharge: N/a  Medications that remain the same after Hospital Discharge:??  -All other medications will remain the same.     Admitted to the hospital on 02/11/21 due to Palpitations. Discharge date was 02/11/21. Discharged from [place]?Medications Started at [facility] Discharge:?? N/a  Medication Changes at Hospital Discharge: N/a  Medications Discontinued at Hospital  Discharge: N/a  Medications that remain the same after Hospital Discharge:??  -All other medications will remain the same.     Admitted to the hospital on 02/19/21 due to Chest pain. Discharge date was 02/19/21 Discharged from [place]?Medications Started at [facility] Discharge:?? N/a  Medication Changes at Hospital Discharge: N/a  Medications Discontinued at Hospital Discharge: N/a  Medications that remain the same after Hospital Discharge:??  -All other medications will remain the same.    Admitted to the hospital on 03/03/21 due to Delusions. Discharge date was 03/03/21 Discharged from [place]?Medications Started at [facility] Discharge:?? N/a  Medication Changes at Hospital Discharge: Started Albuterol inhaler, prednisone for the next 5 days  Medications Discontinued at Hospital Discharge: N/a  Medications that remain the same after Hospital Discharge:??  -All other medications will remain the same.      Medications: Outpatient Encounter Medications as of 04/01/2021  Medication Sig   acetaminophen (TYLENOL) 500 MG tablet Take 500 mg by mouth every 6 (six) hours as needed.   albuterol (VENTOLIN HFA) 108 (90 Base) MCG/ACT inhaler Inhale 2 puffs into the lungs every 4 (four) hours as needed for wheezing or shortness of breath.   aspirin 81 MG chewable tablet Chew 1 tablet (81 mg total) by mouth daily.   atorvastatin (LIPITOR) 80 MG tablet Take 1 tablet (80 mg total) by mouth daily.   Blood Glucose Monitoring Suppl (ONETOUCH VERIO) w/Device KIT Use to check blood sugar 3 to 4 times a day and document.  Please bring to visits for review.   budesonide-formoterol (SYMBICORT) 80-4.5 MCG/ACT inhaler  Inhale 2 puffs into the lungs 2 (two) times daily.   Cholecalciferol 125 MCG (5000 UT) TABS Take 1 tablet (5,000 Units total) by mouth daily.   Elastic Bandages & Supports (MEDICAL COMPRESSION STOCKINGS) MISC 1 Package by Does not  apply route daily.   empagliflozin (JARDIANCE) 10 MG TABS tablet Take 1 tablet (10 mg total) by mouth daily.   ezetimibe (ZETIA) 10 MG tablet Take 1 tablet (10 mg total) by mouth daily.   furosemide (LASIX) 40 MG tablet TAKE 2 TABLETS BY MOUTH ONCE EVERY MORNING AND 1 TABLET ONCE EVERY EVENING   glucose blood (ONETOUCH VERIO) test strip Use to check blood sugar 3 to 4 times a day.   metoprolol succinate (TOPROL XL) 50 MG 24 hr tablet Take 1 tablet (50 mg total) by mouth daily.   MODERNA COVID-19 VACCINE 100 MCG/0.5ML injection    nitroGLYCERIN (NITROSTAT) 0.4 MG SL tablet Place 1 tablet (0.4 mg total) under the tongue every 5 (five) minutes as needed for chest pain.   olmesartan (BENICAR) 5 MG tablet Take 2 tablets (10 mg total) by mouth daily.   Omega-3 Fatty Acids (OMEGA 3 500 PO) Take 500 mg by mouth daily.   OneTouch Delica Lancets 62H MISC Use to check blood sugar 2-3 times a day.   pantoprazole (PROTONIX) 40 MG tablet Take 1 tablet (40 mg total) by mouth daily.   predniSONE (DELTASONE) 20 MG tablet Take 3 tablets (60 mg total) by mouth daily. Start on 03/04/21   risperiDONE (RISPERDAL) 0.5 MG tablet Take 1 tablet (0.5 mg total) by mouth 2 (two) times daily.   rivaroxaban (XARELTO) 20 MG TABS tablet Take 1 tablet (20 mg total) by mouth at bedtime.   sharps container 1 each by Does not apply route as needed.   Skin Protectants, Misc. (EUCERIN) cream Apply topically as needed for dry skin.   triamcinolone cream (KENALOG) 0.1 % APPLY TO AFFECTED AREA(s) TWICE DAILY   vitamin B-12 (CYANOCOBALAMIN) 1000 MCG tablet Take 1 tablet (1,000 mcg total) by mouth daily.   No facility-administered encounter medications on file as of 04/01/2021.  04/01/2021 Name: Evan [name] MRN: [id number] DOB: 1956-02-26 [name] is a 65 y.o. year old male who is a primary care patient of [name], [name], NP.  Comprehensive medication review performed; Spoke to patient regarding cholesterol  Lipid Panel     Component Value Date/Time   CHOL 280 (H) 02/08/2021 1022   CHOL 194 12/30/2013 0413   TRIG 128 02/08/2021 1022   TRIG 158 12/30/2013 0413   HDL 55 02/08/2021 1022   HDL 42 12/30/2013 0413   LDLCALC 202 (H) 02/08/2021 1022   LDLCALC 120 (H) 12/30/2013 0413   LDLDIRECT 178.5 07/26/2011 1234    10-year ASCVD risk score: The ASCVD Risk score Mikey Bussing DC Jr., et al., 2013) failed to calculate for the following reasons:   The patient has a prior MI or stroke diagnosis  Current antihyperlipidemic regimen:  Atorvastatin 80 mg  Jardiance 10 mg  olmesartan 5 mg  Previous antihyperlipidemic medications tried: N/a ASCVD risk enhancing conditions: DM What recent interventions/DTPs have been made by any provider to improve Cholesterol control since last CPP Visit: N/a Any recent hospitalizations or ED visits since last visit with CPP? Yes What diet changes have been made to improve Cholesterol?  N/a  What exercise is being done to improve Cholesterol?  Patient states that he takes walks   Adherence Review: Does the patient have >5 day gap between  last estimated fill dates? Yes  Misc comment: Spoke with patient and patient states he has trouble seeing since he had his stroke. Per patient he is unable to tell me what medication he has, and unable to read the fill dates.    Star Rating Drugs: Atorvastatin 80 mg  Jardiance 10 mg   Andee Poles, CMA

## 2021-04-08 ENCOUNTER — Other Ambulatory Visit: Payer: Self-pay | Admitting: Nurse Practitioner

## 2021-04-08 NOTE — Telephone Encounter (Signed)
Requested medication (s) are due for refill today - soon  Requested medication (s) are on the active medication list -yes  Future visit scheduled -yes  Last refill: 01/20/21 #270  Notes to clinic: Request RF: Patient fails lab normal range and has not been seen for hospital f/u yet- appointment 9/8. Should have enough medication until that appointment  Requested Prescriptions  Pending Prescriptions Disp Refills   furosemide (LASIX) 40 MG tablet [Pharmacy Med Name: FUROSEMIDE 40 MG TAB] 270 tablet 0    Sig: TAKE 2 TABLETS BY MOUTH ONCE EVERY MORNING AND 1 TABLET ONCE EVERY EVENING     Cardiovascular:  Diuretics - Loop Failed - 04/08/2021 12:17 PM      Failed - K in normal range and within 360 days    Potassium  Date Value Ref Range Status  03/03/2021 3.2 (L) 3.5 - 5.1 mmol/L Final  11/29/2014 4.0 mmol/L Final    Comment:    3.5-5.1 NOTE: New Reference Range  10/13/14           Failed - Last BP in normal range    BP Readings from Last 1 Encounters:  03/03/21 (!) 145/92          Passed - Ca in normal range and within 360 days    Calcium  Date Value Ref Range Status  03/03/2021 8.9 8.9 - 10.3 mg/dL Final   Calcium, Total  Date Value Ref Range Status  11/29/2014 9.0 mg/dL Final    Comment:    8.9-10.3 NOTE: New Reference Range  10/13/14           Passed - Na in normal range and within 360 days    Sodium  Date Value Ref Range Status  03/03/2021 138 135 - 145 mmol/L Final  02/08/2021 142 134 - 144 mmol/L Final  11/29/2014 137 mmol/L Final    Comment:    135-145 NOTE: New Reference Range  10/13/14           Passed - Cr in normal range and within 360 days    Creatinine  Date Value Ref Range Status  11/29/2014 1.20 mg/dL Final    Comment:    0.61-1.24 NOTE: New Reference Range  10/13/14    Creatinine, Ser  Date Value Ref Range Status  03/03/2021 0.98 0.61 - 1.24 mg/dL Final          Passed - Valid encounter within last 6 months    Recent  Outpatient Visits           1 month ago Type 2 diabetes mellitus with morbid obesity ([place])   [place], [name] T, NP   3 months ago Type 2 diabetes mellitus with morbid obesity ([place])   [place], [name] T, NP   4 months ago Hyperlipidemia associated with type 2 diabetes mellitus ([place])   [place] [name], [name] T, NP   6 months ago Hypertension associated with diabetes ([place])   [place] [name], [name] T, NP   6 months ago Lab test positive for detection of COVID-19 virus   [place], [name], NP       Future Appointments             In 1 month [name], [name], NP [facility]   In 10 months  [facility]               Requested Prescriptions  Pending Prescriptions Disp Refills  furosemide (LASIX) 40 MG tablet [Pharmacy Med Name: FUROSEMIDE 40 MG TAB] 270 tablet 0    Sig: TAKE 2 TABLETS BY MOUTH ONCE EVERY MORNING AND 1 TABLET ONCE EVERY EVENING     Cardiovascular:  Diuretics - Loop Failed - 04/08/2021 12:17 PM      Failed - K in normal range and within 360 days    Potassium  Date Value Ref Range Status  03/03/2021 3.2 (L) 3.5 - 5.1 mmol/L Final  11/29/2014 4.0 mmol/L Final    Comment:    3.5-5.1 NOTE: New Reference Range  10/13/14           Failed - Last BP in normal range    BP Readings from Last 1 Encounters:  03/03/21 (!) 145/92          Passed - Ca in normal range and within 360 days    Calcium  Date Value Ref Range Status  03/03/2021 8.9 8.9 - 10.3 mg/dL Final   Calcium, Total  Date Value Ref Range Status  11/29/2014 9.0 mg/dL Final    Comment:    8.9-10.3 NOTE: New Reference Range  10/13/14           Passed - Na in normal range and within 360 days    Sodium  Date Value Ref Range Status  03/03/2021 138 135 - 145 mmol/L Final  02/08/2021 142 134 - 144 mmol/L Final  11/29/2014 137 mmol/L Final     Comment:    135-145 NOTE: New Reference Range  10/13/14           Passed - Cr in normal range and within 360 days    Creatinine  Date Value Ref Range Status  11/29/2014 1.20 mg/dL Final    Comment:    0.61-1.24 NOTE: New Reference Range  10/13/14    Creatinine, Ser  Date Value Ref Range Status  03/03/2021 0.98 0.61 - 1.24 mg/dL Final          Passed - Valid encounter within last 6 months    Recent Outpatient Visits           1 month ago Type 2 diabetes mellitus with morbid obesity ([place])   [place], [name] T, NP   3 months ago Type 2 diabetes mellitus with morbid obesity ([place])   [place], [name] T, NP   4 months ago Hyperlipidemia associated with type 2 diabetes mellitus ([place])   [place] [name], [name] T, NP   6 months ago Hypertension associated with diabetes ([place])   [place] [name], [name] T, NP   6 months ago Lab test positive for detection of COVID-19 virus   [place], [name], NP       Future Appointments             In 1 month [name], [name], NP [facility]   In 10 months  [facility]

## 2021-04-10 ENCOUNTER — Emergency Department
Admission: EM | Admit: 2021-04-10 | Discharge: 2021-04-10 | Disposition: A | Discharge status: Home / Self Care | Payer: Medicare Other | Attending: Emergency Medicine | Admitting: Emergency Medicine

## 2021-04-10 ENCOUNTER — Other Ambulatory Visit: Payer: Self-pay

## 2021-04-10 DIAGNOSIS — E1169 Type 2 diabetes mellitus with other specified complication: Secondary | ICD-10-CM | POA: Insufficient documentation

## 2021-04-10 DIAGNOSIS — J45909 Unspecified asthma, uncomplicated: Secondary | ICD-10-CM | POA: Diagnosis not present

## 2021-04-10 DIAGNOSIS — Z7982 Long term (current) use of aspirin: Secondary | ICD-10-CM | POA: Insufficient documentation

## 2021-04-10 DIAGNOSIS — I5032 Chronic diastolic (congestive) heart failure: Secondary | ICD-10-CM | POA: Insufficient documentation

## 2021-04-10 DIAGNOSIS — B369 Superficial mycosis, unspecified: Secondary | ICD-10-CM | POA: Insufficient documentation

## 2021-04-10 DIAGNOSIS — Z8546 Personal history of malignant neoplasm of prostate: Secondary | ICD-10-CM | POA: Diagnosis not present

## 2021-04-10 DIAGNOSIS — Z79899 Other long term (current) drug therapy: Secondary | ICD-10-CM | POA: Insufficient documentation

## 2021-04-10 DIAGNOSIS — E785 Hyperlipidemia, unspecified: Secondary | ICD-10-CM | POA: Diagnosis not present

## 2021-04-10 DIAGNOSIS — M79672 Pain in left foot: Secondary | ICD-10-CM | POA: Diagnosis not present

## 2021-04-10 DIAGNOSIS — Z7901 Long term (current) use of anticoagulants: Secondary | ICD-10-CM | POA: Diagnosis not present

## 2021-04-10 DIAGNOSIS — I110 Hypertensive heart disease with heart failure: Secondary | ICD-10-CM | POA: Diagnosis not present

## 2021-04-10 DIAGNOSIS — J449 Chronic obstructive pulmonary disease, unspecified: Secondary | ICD-10-CM | POA: Diagnosis not present

## 2021-04-10 DIAGNOSIS — Z743 Need for continuous supervision: Secondary | ICD-10-CM | POA: Diagnosis not present

## 2021-04-10 DIAGNOSIS — B49 Unspecified mycosis: Secondary | ICD-10-CM

## 2021-04-10 DIAGNOSIS — R52 Pain, unspecified: Secondary | ICD-10-CM | POA: Diagnosis not present

## 2021-04-10 DIAGNOSIS — Z951 Presence of aortocoronary bypass graft: Secondary | ICD-10-CM | POA: Diagnosis not present

## 2021-04-10 DIAGNOSIS — R6889 Other general symptoms and signs: Secondary | ICD-10-CM | POA: Diagnosis not present

## 2021-04-10 DIAGNOSIS — M79673 Pain in unspecified foot: Secondary | ICD-10-CM | POA: Diagnosis not present

## 2021-04-10 DIAGNOSIS — I2510 Atherosclerotic heart disease of native coronary artery without angina pectoris: Secondary | ICD-10-CM | POA: Insufficient documentation

## 2021-04-10 DIAGNOSIS — Z8616 Personal history of COVID-19: Secondary | ICD-10-CM | POA: Diagnosis not present

## 2021-04-10 DIAGNOSIS — M79671 Pain in right foot: Secondary | ICD-10-CM | POA: Diagnosis not present

## 2021-04-10 MED ORDER — BUTENAFINE HCL 1 % EX CREA: 1.0000 "application " | TOPICAL_CREAM | Freq: Two times a day (BID) | CUTANEOUS | 1 refills | Status: AC

## 2021-04-10 NOTE — Discharge Instructions (Addendum)
Use the ointment as prescribed.  Follow-up with your primary provider or podiatry for ongoing symptoms.

## 2021-04-10 NOTE — ED Triage Notes (Signed)
Pt comes ems from home with bilateral foot pain and skin peeling. Chronic. Has a podiatrist but unable to get to an appointment because of transportation. VSS. 176/90. CBG 138.

## 2021-04-10 NOTE — ED Provider Notes (Signed)
[facility] Emergency Department Provider Note ____________________________________________  Time seen: 1057  I have reviewed the triage vital signs and the nursing notes.  HISTORY  Chief Complaint  Foot Pain   HPI Evan [name] is a 65 y.o. male with below medical history, presents to the ED with chronic skin peeling to the feet bilaterally.  Patient denies any recent injury, trauma, fall.  Denies any weeping, bleeding, pruritus, or blisters to the feet.  Patient has a podiatrist, but reports he has been unable to secure an appointment because of transportation issues.  He denies any fever, chills, or sweats.  Past Medical History:  Diagnosis Date   Anginal pain ([place])    Anxiety disorder    Asthma    Atresia of esophagus without fistula    CAD (coronary artery disease)    Cellulitis    CHF (congestive heart failure) (HCC)    NYHA CLASS III,CHRONIC,DIASTOLIC   COPD (chronic obstructive pulmonary disease) ([place])    COVID-19    Diabetes mellitus without complication (HCC)    Edema    RIGHT LOWER LEG   Gastroesophageal reflux    H/O: GI bleed    History of pneumonia    Remote   History of scarlet fever    Childhood   Hyperlipidemia    Hypertension    Myocardial infarction ([place]) 2009   Obesity    Obstructive sleep apnea    Pain    CHRONIC BACK / ABDOMINAL   Panic disorder    Peripheral venous insufficiency    PTSD (post-traumatic stress disorder)    Retinopathy    DIABETIC   Stasis, venous    Stroke [facility])    Vertigo     Patient Active Problem List   Diagnosis Date Noted   Delusional disorder ([place]) 03/03/2021   Other thrombophilia ([place]) 12/25/2020   Vitamin D deficiency 12/04/2020   Vitamin B12 deficiency 12/04/2020   Stenosis of right carotid artery 12/03/2020   History of CVA (cerebrovascular accident) 11/06/2020   History of 2019 novel coronavirus disease (COVID-19) 09/27/2020   Atherosclerosis of aorta ([place]) 12/04/2019    Persistent atrial fibrillation ([place]) 09/12/2019   CAD (coronary artery disease) 09/12/2019   Chronic venous stasis 09/12/2019   Hoarding disorder 09/12/2019   Cervical spinal stenosis 09/12/2019   Diabetic retinopathy ([place]) 09/12/2019   COPD, mild ([place]) 09/12/2019   Osteoporosis 09/09/2019   History of prostate cancer 09/09/2019   Anxiety 08/10/2019   Obstructive sleep apnea on CPAP 08/10/2019   Hyperlipidemia associated with type 2 diabetes mellitus ([place]) 08/10/2019   Chronic diastolic CHF (congestive heart failure) ([place]) 01/09/2014   Esophageal dysmotility 09/12/2013   Type 2 diabetes mellitus with morbid obesity ([place]) 02/14/2012   Obesity, morbid (more than 100 lbs over ideal weight or BMI > 40) ([place]) 07/26/2011   OLD MYOCARDIAL INFARCTION 03/02/2010   Hypertension associated with diabetes ([place]) 03/20/2009   CORONARY ATHEROSCLEROSIS, ARTERY BYPASS GRAFT 03/20/2009    Past Surgical History:  Procedure Laterality Date   CARDIAC CATHETERIZATION     CATARACT EXTRACTION Left    CATARACT EXTRACTION W/PHACO Right 05/03/2017   Procedure: CATARACT EXTRACTION PHACO AND INTRAOCULAR LENS PLACEMENT (IOC);  Surgeon: Leandrew Koyanagi, MD;  Location: [facility] ORS;  Service: Ophthalmology;  Laterality: Right;  [place] 00:35.3 AP% 12.3 CDE 4.33 Fluid Pack lot # [id number]        COLONOSCOPY WITH PROPOFOL N/A 11/26/2020   Procedure: COLONOSCOPY WITH PROPOFOL;  Surgeon: Jonathon Bellows, MD;  Location[facility] ENDOSCOPY;  Service: Gastroenterology;  Laterality: N/A;  ANNETTE TO PICK UP [phone]   CORONARY ANGIOPLASTY WITH STENT PLACEMENT  2002   CORONARY ANGIOPLASTY WITH STENT PLACEMENT  1999   CORONARY ARTERY BYPASS GRAFT     x7   ESOPHAGOGASTRODUODENOSCOPY N/A 09/19/2016   Procedure: ESOPHAGOGASTRODUODENOSCOPY (EGD);  Surgeon: Lollie Sails, MD;  Location: [facility] ENDOSCOPY;  Service: Endoscopy;  Laterality: N/A;   ESOPHAGOGASTRODUODENOSCOPY (EGD) WITH PROPOFOL  11/26/2020   Procedure:  ESOPHAGOGASTRODUODENOSCOPY (EGD) WITH PROPOFOL;  Surgeon: Jonathon Bellows, MD;  Location: [facility] ENDOSCOPY;  Service: Gastroenterology;;    Prior to Admission medications   Medication Sig Start Date End Date Taking? Authorizing Provider  Butenafine HCl (LOTRIMIN ULTRA) 1 % cream Apply 1 application topically 2 (two) times daily for 20 days. 04/10/21 04/30/21 Yes Lakena Sparlin, Dannielle Karvonen, PA-C  acetaminophen (TYLENOL) 500 MG tablet Take 500 mg by mouth every 6 (six) hours as needed.    [provider]  albuterol (VENTOLIN HFA) 108 (90 Base) MCG/ACT inhaler Inhale 2 puffs into the lungs every 4 (four) hours as needed for wheezing or shortness of breath. 03/03/21   Ward, Delice Bison, DO  aspirin 81 MG chewable tablet Chew 1 tablet (81 mg total) by mouth daily. 11/23/20   [name], Henrine Screws T, NP  atorvastatin (LIPITOR) 80 MG tablet Take 1 tablet (80 mg total) by mouth daily. 11/26/20 02/19/22  [name], Henrine Screws T, NP  Blood Glucose Monitoring Suppl (ONETOUCH VERIO) w/Device KIT Use to check blood sugar 3 to 4 times a day and document.  Please bring to visits for review. 07/08/20   [name], Henrine Screws T, NP  budesonide-formoterol (SYMBICORT) 80-4.5 MCG/ACT inhaler Inhale 2 puffs into the lungs 2 (two) times daily. 09/24/20   [name] T, NP  Cholecalciferol 125 MCG (5000 UT) TABS Take 1 tablet (5,000 Units total) by mouth daily. 12/04/20   [name], NP  Elastic Bandages & Supports (MEDICAL COMPRESSION STOCKINGS) MISC 1 Package by Does not apply route daily. 08/28/20   Sable Feil, PA-C  empagliflozin (JARDIANCE) 10 MG TABS tablet Take 1 tablet (10 mg total) by mouth daily. 07/08/20   [name], Henrine Screws T, NP  ezetimibe (ZETIA) 10 MG tablet Take 1 tablet (10 mg total) by mouth daily. 10/07/20   [name], Henrine Screws T, NP  furosemide (LASIX) 40 MG tablet TAKE 2 TABLETS BY MOUTH ONCE EVERY MORNING AND 1 TABLET ONCE EVERY EVENING 04/08/21   [name], [name] T, NP  glucose blood (ONETOUCH VERIO) test strip Use to check  blood sugar 3 to 4 times a day. 07/08/20   [name], Henrine Screws T, NP  metoprolol succinate (TOPROL XL) 50 MG 24 hr tablet Take 1 tablet (50 mg total) by mouth daily. 07/08/20   [name], NP  MODERNA COVID-19 VACCINE 100 MCG/0.5ML injection  08/28/20   [provider]  nitroGLYCERIN (NITROSTAT) 0.4 MG SL tablet Place 1 tablet (0.4 mg total) under the tongue every 5 (five) minutes as needed for chest pain. 10/10/19   [name], Henrine Screws T, NP  olmesartan (BENICAR) 5 MG tablet Take 2 tablets (10 mg total) by mouth daily. 12/03/20   [name], Henrine Screws T, NP  Omega-3 Fatty Acids (OMEGA 3 500 PO) Take 500 mg by mouth daily.    [provider]  OneTouch Delica Lancets 01S MISC Use to check blood sugar 2-3 times a day. 09/29/20   [name], Henrine Screws T, NP  pantoprazole (PROTONIX) 40 MG tablet Take 1 tablet (40 mg total) by mouth daily. 04/08/20   [name]  T, NP  predniSONE (DELTASONE) 20 MG tablet Take 3 tablets (60 mg total) by mouth daily. Start on 03/04/21 03/03/21   Ward, Delice Bison, DO  risperiDONE (RISPERDAL) 0.5 MG tablet Take 1 tablet (0.5 mg total) by mouth 2 (two) times daily. 07/08/20   [name], Henrine Screws T, NP  rivaroxaban (XARELTO) 20 MG TABS tablet Take 1 tablet (20 mg total) by mouth at bedtime. 07/08/20   [name] T, NP  sharps container 1 each by Does not apply route as needed. 09/29/20   [name], NP  Skin Protectants, Misc. (EUCERIN) cream Apply topically as needed for dry skin. 08/28/20   Sable Feil, PA-C  triamcinolone cream (KENALOG) 0.1 % APPLY TO AFFECTED AREA(s) TWICE DAILY 12/29/20   [name], Henrine Screws T, NP  vitamin B-12 (CYANOCOBALAMIN) 1000 MCG tablet Take 1 tablet (1,000 mcg total) by mouth daily. 12/04/20   [name] T, NP    Allergies Penicillin g, Sulfa antibiotics, Tiotropium, and Zoloft [sertraline hcl]  Family History  Problem Relation Age of Onset   Heart attack Mother    Hypertension Mother    Hyperlipidemia Mother    Heart attack Brother  36       MI   Coronary artery disease Other     Social History Social History   Tobacco Use   Smoking status: Never   Smokeless tobacco: Never  Vaping Use   Vaping Use: Never used  Substance Use Topics   Alcohol use: No   Drug use: No    Review of Systems  Constitutional: Negative for fever. Eyes: Negative for visual changes. ENT: Negative for sore throat. Cardiovascular: Negative for chest pain. Respiratory: Negative for shortness of breath. Gastrointestinal: Negative for abdominal pain, vomiting and diarrhea. Genitourinary: Negative for dysuria. Musculoskeletal: Negative for back pain. Skin: Negative for rash. Neurological: Negative for headaches, focal weakness or numbness. ____________________________________________  PHYSICAL EXAM:  VITAL SIGNS: ED Triage Vitals  Enc Vitals Group     BP 04/10/21 0855 131/82     Pulse Rate 04/10/21 0853 92     Resp 04/10/21 0853 18     Temp 04/10/21 0853 98.4 F (36.9 C)     Temp Source 04/10/21 0853 Oral     SpO2 04/10/21 0853 95 %     Weight 04/10/21 0854 (!) 337 lb (152.9 kg)     Height 04/10/21 0854 5' 5"  (1.651 m)     Head Circumference --      Peak Flow --      Pain Score 04/10/21 0854 0     Pain Loc --      Pain Edu? --      Excl. in [place]? --     Constitutional: Alert and oriented. Well appearing and in no distress. Head: Normocephalic and atraumatic. Eyes: Conjunctivae are normal. Normal extraocular movements Cardiovascular: Normal rate, regular rhythm. Normal distal pulses. Respiratory: Normal respiratory effort. No wheezes/rales/rhonchi. Musculoskeletal: Nontender with normal range of motion in all extremities.  Neurologic:  Normal gait without ataxia. Normal speech and language. No gross focal neurologic deficits are appreciated. Skin:  Skin is warm, dry and intact. No rash noted.  Patient with hypertrophic, scaling peeling skin primarily to the plantar surface of the feet.  He also noted to have hypertrophic  and overgrown toenails.  Skin changes are consistent with a probable tinea infection and underlying callus formation. Psychiatric: Mood and affect are normal. Patient exhibits appropriate insight and judgment. ____________________________________________    {LABS (pertinent positives/negatives)  ____________________________________________  {  EKG  ____________________________________________   [facility] report(s): No results found. ____________________________________________  PROCEDURES   Procedures ____________________________________________   INITIAL IMPRESSION / ASSESSMENT AND PLAN / ED COURSE  As part of my medical decision making, I reviewed the following data within the electronic MEDICAL RECORD NUMBER Notes from prior ED visits    DDX: tinea pedis, callous, cellulitis    Patient to the ED for evaluation of thick peeling skin to the feet.  Patient denies any fever, chills, or sweats.  He is evaluated for his complaint, found to have hypertrophic peeling skin consistent with a probable tinea pedis.  Patient be discharged with a prescription for clotrimazole cream to apply to the skin of the feet.  Follow-up with primary provider or podiatry for ongoing symptoms.  Evan [name] was evaluated in Emergency Department on 04/10/2021 for the symptoms described in the history of present illness. He was evaluated in the context of the global COVID-19 pandemic, which necessitated consideration that the patient might be at risk for infection with the SARS-CoV-2 virus that causes COVID-19. Institutional protocols and algorithms that pertain to the evaluation of patients at risk for COVID-19 are in a state of rapid change based on information released by regulatory bodies including the CDC and federal and state organizations. These policies and algorithms were followed during the patient's care in the ED. ____________________________________________  FINAL CLINICAL  IMPRESSION(S) / ED DIAGNOSES  Final diagnoses:  Fungus disease      Melvenia Needles, PA-C 04/10/21 1151    Blake Divine, MD 04/10/21 1559

## 2021-04-11 ENCOUNTER — Other Ambulatory Visit: Payer: Self-pay | Admitting: Nurse Practitioner

## 2021-04-12 ENCOUNTER — Other Ambulatory Visit: Payer: Self-pay

## 2021-04-12 MED ORDER — PANTOPRAZOLE SODIUM 40 MG PO TBEC: 40.0000 mg | DELAYED_RELEASE_TABLET | Freq: Every day | ORAL | 4 refills | Status: DC

## 2021-04-14 ENCOUNTER — Ambulatory Visit (INDEPENDENT_AMBULATORY_CARE_PROVIDER_SITE_OTHER): Payer: Medicare Other | Admitting: Licensed Clinical Social Worker

## 2021-04-14 DIAGNOSIS — F339 Major depressive disorder, recurrent, unspecified: Secondary | ICD-10-CM

## 2021-04-14 DIAGNOSIS — F22 Delusional disorders: Secondary | ICD-10-CM

## 2021-04-14 DIAGNOSIS — F419 Anxiety disorder, unspecified: Secondary | ICD-10-CM

## 2021-04-14 DIAGNOSIS — I5032 Chronic diastolic (congestive) heart failure: Secondary | ICD-10-CM

## 2021-04-14 DIAGNOSIS — E1169 Type 2 diabetes mellitus with other specified complication: Secondary | ICD-10-CM

## 2021-04-14 DIAGNOSIS — E1159 Type 2 diabetes mellitus with other circulatory complications: Secondary | ICD-10-CM

## 2021-04-14 DIAGNOSIS — I152 Hypertension secondary to endocrine disorders: Secondary | ICD-10-CM

## 2021-04-14 NOTE — Chronic Care Management (AMB) (Signed)
Chronic Care Management    Clinical Social Work Note  04/14/2021 Name: Evan [name] MRN: [id number] DOB: 06-21-1956  [name] Abeyta is a 65 y.o. year old male who is a primary care patient of [name], [name], NP. The CCM team was consulted to assist the patient with chronic disease management and/or care coordination needs related to: Transportation Needs  and [place] and Resources.   Engaged with patient by telephone for follow up visit in response to provider referral for social work chronic care management and care coordination services.   Consent to Services:  The patient was given information about Chronic Care Management services, agreed to services, and gave verbal consent prior to initiation of services.  Please see initial visit note for detailed documentation.   Patient agreed to services and consent obtained.   Consent to Services:  The patient was given information about Care Management services, agreed to services, and gave verbal consent prior to initiation of services.  Please see initial visit note for detailed documentation.   Patient agreed to services today and consent obtained.   Assessment: Engaged with patient by phone in response to provider referral for social work care coordination services: Transportation Needs  and [place] and Resources.    Patient continues to maintain positive progress with care plan goals. States he continues to receive support from aid and participates in a group weekly to promote socialization. Strategies to assist with symptom management identified. Patient is aware of transportation services to assist with upcoming appointments. See Care Plan below for interventions and patient self-care activities.  Recent life changes or stressors: Management of health conditions  Recommendation: Patient may benefit from, and is in agreement work with LCSW to address care coordination needs and will continue to work  with the clinical team to address health care and disease management related needs.   Follow up Plan: Patient would like continued follow-up from CCM LCSW .  per patient's request will follow up in 05/02/21.  Will call office if needed prior to next encounter.   SDOH (Social Determinants of Health) assessments and interventions performed: Transportation  SDOH Interventions    Flowsheet Row Most Recent Value  SDOH Interventions   Food Insecurity Interventions Intervention Not Indicated  Housing Interventions Intervention Not Indicated  Stress Interventions Intervention Not Indicated  Transportation Interventions Other (Comment)  [Patient was provided supportive information for local transportation resources]        Advanced Directives Status: Not addressed in this encounter.  CCM Care Plan  Allergies  Allergen Reactions   Penicillin G Hives   Sulfa Antibiotics Hives   Tiotropium    Zoloft [Sertraline Hcl] Other (See Comments)    Outpatient Encounter Medications as of 04/14/2021  Medication Sig   acetaminophen (TYLENOL) 500 MG tablet Take 500 mg by mouth every 6 (six) hours as needed.   albuterol (VENTOLIN HFA) 108 (90 Base) MCG/ACT inhaler Inhale 2 puffs into the lungs every 4 (four) hours as needed for wheezing or shortness of breath.   aspirin 81 MG chewable tablet Chew 1 tablet (81 mg total) by mouth daily.   atorvastatin (LIPITOR) 80 MG tablet Take 1 tablet (80 mg total) by mouth daily.   Blood Glucose Monitoring Suppl (ONETOUCH VERIO) w/Device KIT Use to check blood sugar 3 to 4 times a day and document.  Please bring to visits for review.   budesonide-formoterol (SYMBICORT) 80-4.5 MCG/ACT inhaler Inhale 2 puffs into the lungs 2 (two) times daily.  Butenafine HCl (LOTRIMIN ULTRA) 1 % cream Apply 1 application topically 2 (two) times daily for 20 days.   Cholecalciferol 125 MCG (5000 UT) TABS Take 1 tablet (5,000 Units total) by mouth daily.   Elastic Bandages & Supports  (MEDICAL COMPRESSION STOCKINGS) MISC 1 Package by Does not apply route daily.   empagliflozin (JARDIANCE) 10 MG TABS tablet Take 1 tablet (10 mg total) by mouth daily.   ezetimibe (ZETIA) 10 MG tablet Take 1 tablet (10 mg total) by mouth daily.   furosemide (LASIX) 40 MG tablet TAKE 2 TABLETS BY MOUTH ONCE EVERY MORNING AND 1 TABLET ONCE EVERY EVENING   glucose blood (ONETOUCH VERIO) test strip Use to check blood sugar 3 to 4 times a day.   metoprolol succinate (TOPROL XL) 50 MG 24 hr tablet Take 1 tablet (50 mg total) by mouth daily.   MODERNA COVID-19 VACCINE 100 MCG/0.5ML injection    nitroGLYCERIN (NITROSTAT) 0.4 MG SL tablet Place 1 tablet (0.4 mg total) under the tongue every 5 (five) minutes as needed for chest pain.   olmesartan (BENICAR) 5 MG tablet Take 2 tablets (10 mg total) by mouth daily.   Omega-3 Fatty Acids (OMEGA 3 500 PO) Take 500 mg by mouth daily.   OneTouch Delica Lancets 49Q MISC Use to check blood sugar 2-3 times a day.   pantoprazole (PROTONIX) 40 MG tablet Take 1 tablet (40 mg total) by mouth daily.   predniSONE (DELTASONE) 20 MG tablet Take 3 tablets (60 mg total) by mouth daily. Start on 03/04/21   risperiDONE (RISPERDAL) 0.5 MG tablet Take 1 tablet (0.5 mg total) by mouth 2 (two) times daily.   rivaroxaban (XARELTO) 20 MG TABS tablet Take 1 tablet (20 mg total) by mouth at bedtime.   sharps container 1 each by Does not apply route as needed.   Skin Protectants, Misc. (EUCERIN) cream Apply topically as needed for dry skin.   triamcinolone cream (KENALOG) 0.1 % APPLY TO AFFECTED AREA(s) TWICE DAILY   vitamin B-12 (CYANOCOBALAMIN) 1000 MCG tablet Take 1 tablet (1,000 mcg total) by mouth daily.   No facility-administered encounter medications on file as of 04/14/2021.    Patient Active Problem List   Diagnosis Date Noted   Delusional disorder ([place]) 03/03/2021   Other thrombophilia ([place]) 12/25/2020   Vitamin D deficiency 12/04/2020   Vitamin B12 deficiency 12/04/2020    Stenosis of right carotid artery 12/03/2020   History of CVA (cerebrovascular accident) 11/06/2020   History of 2019 novel coronavirus disease (COVID-19) 09/27/2020   Atherosclerosis of aorta ([place]) 12/04/2019   Persistent atrial fibrillation ([place]) 09/12/2019   CAD (coronary artery disease) 09/12/2019   Chronic venous stasis 09/12/2019   Hoarding disorder 09/12/2019   Cervical spinal stenosis 09/12/2019   Diabetic retinopathy ([place]) 09/12/2019   COPD, mild ([place]) 09/12/2019   Osteoporosis 09/09/2019   History of prostate cancer 09/09/2019   Anxiety 08/10/2019   Obstructive sleep apnea on CPAP 08/10/2019   Hyperlipidemia associated with type 2 diabetes mellitus ([place]) 08/10/2019   Chronic diastolic CHF (congestive heart failure) ([place]) 01/09/2014   Esophageal dysmotility 09/12/2013   Type 2 diabetes mellitus with morbid obesity ([place]) 02/14/2012   Obesity, morbid (more than 100 lbs over ideal weight or BMI > 40) ([place]) 07/26/2011   OLD MYOCARDIAL INFARCTION 03/02/2010   Hypertension associated with diabetes ([place]) 03/20/2009   CORONARY ATHEROSCLEROSIS, ARTERY BYPASS GRAFT 03/20/2009    Conditions to be addressed/monitored: Anxiety and Depression; Mental Health Concerns   Care Plan : General  Social Work (Adult)  Updates made by [name] D, LCSW since 04/14/2021 12:00 AM     Problem: Response to Treatment (Depression)      Goal: Response to Treatment Maximized   Start Date: 12/27/2020  This Visit's Progress: On track  Recent Progress: On track  Priority: High  Note:   Current barriers:   Acute Mental Health needs related to Anxiety Housing barriers, Level of care concerns, and Mental Health Concerns  Needs Support, Education, and Care Coordination in order to meet unmet mental health needs. Clinical Goal(s): Over the next 120 days, patient will work with SW to reduce or manage symptoms of anxiety and increase knowledge and/or ability of: coping skills, healthy habits,  self-management skills, and stress reduction.until connected for ongoing counseling.  Clinical Interventions:  Assessed patient's previous treatment, needs, coping skills, current treatment, support system and barriers to care Patient interviewed and appropriate assessments performed Provided mental health counseling with regard to managing mental health conditions Patient reports difficulty managing symptoms of anxiety triggered by chronic health conditions. Patient is upset about the way he is treated by multiple personal aids, stating that current aid is dishonest about why she does not show to work. CCM LCSW strongly encouraged patient to contact agency about his concerns to reach a resolution 09/08: [name] is back as his guide for 27 hrs a week [place] [phone] CCM LCSW discussed CBT strategies to assist with managing symptoms  Patient has scheduled transportation assistance through [place] to run errands for next week. He is proud of this noting that he does not want to continue using aid for transportation needs Validation and encouragement was provided during call. CCM LCSW encouraged patient to continue reflecting on past experiences and current friendships to promote positive mood and stress management CCM LCSW provided a supportive environment to allow patient to process his emotions Patient reports that his strong spiritual relationship keeps him encouraged to move forward instead of focusing on past events  Patient reports becoming "stir crazy" from staying at home for majority of the day. CCM LCSW discussed benefits of participating in [facility]. Patient reports hx of attending [facility]. States that he disenrolled because staff and participants believed he was "delusional" because he has close relationships with various celebrities. Patient is not interested in initiating psychiatry and/or counseling to assist with strengthening support system 09/08: Patient  reports that he participates in a group weekly to promote socialization with others CCM LCSW discussed strategies to improve communication skills with aid to assist in preventing misunderstandings or offending others  CCM LCSW reviewed upcoming appointments with patient. He reports that he is utilizing [employer] for transportation. His aid has offered to bring patient to local stores to obtain cleaning supplies. Patient reports that he is aware that he can also utilize [employer] for transportation to medical appointments CCM LCSW discussed strategies to assist patient with memory concerns Other interventions: Solution-Focused Strategies, Mindfulness or Psychologist, educational, Active listening / Reflection utilized , Emotional Supportive Provided, Psychoeducation for mental health needs , Motivational Interviewing, Participation in counseling encouraged , Participation in support group encouraged , Consideration of in-home help encouraged , and Verbalization of feelings encouraged   Discussed plans with patient for ongoing care management follow up and provided patient with direct contact information for care management team Collaboration with PCP regarding development and update of comprehensive plan of care as evidenced by provider attestation and co-signature Inter-disciplinary care team collaboration (see longitudinal plan of care) Patient Goals/Self-Care Activities:  Over the next 120 days Attend all scheduled appointments with providers Contact office with any questions or concerns [facility] transportation number to assist with attending medical appointments Practice relaxation or meditation daily Practice positive thinking and self-talk       [name], MSW, [email or web address] Phone [phone] 10:17 AM

## 2021-04-14 NOTE — Patient Instructions (Signed)
Visit Information   Goals Addressed               This Visit's Progress     Patient Stated     SW-"I need more help." (pt-stated)   On track     Patient Goals/Self-Care Activities: Over the next 120 days Attend all scheduled appointments with providers Contact office with any questions or concerns [facility] transportation number to assist with attending medical appointments Practice relaxation or meditation daily Practice positive thinking and self-talk      Other     SW-Track and Manage My Symptoms-Depression   On track     Timeframe:  Long-Range Goal Priority:  High Start Date:   08/09/20                         Expected End Date: 06/06/21                    Follow Up Date- 05/02/21  Patient Goals/Self-Care Activities: Over the next 120 days Attend all scheduled appointments with providers Contact office with any questions or concerns Utilize [facility] transportation number to assist with attending medical appointments Practice relaxation or meditation daily Practice positive thinking and self-talk        Patient verbalizes understanding of instructions provided today and agrees to view in [place].   Telephone follow up appointment with care management team member scheduled for:05/02/21  [name], MSW, [email or web address] Phone [phone] 10:22 AM

## 2021-04-19 ENCOUNTER — Telehealth: Payer: Self-pay

## 2021-04-19 NOTE — Telephone Encounter (Signed)
Copied from [place] [id number]. Topic: General - Other >> Apr 15, 2021  2:07 PM Valere Dross wrote: Reason for CRM: Pt called in stating his new health care manager number is [phone], and he also asked if he could get a list of his appt sent to his address. Please advise.   Admin Staff- is there any way a list of the patient's appointments can be printed off and mailed to him?

## 2021-04-19 NOTE — Telephone Encounter (Signed)
I have sent pt his future appts via mail.

## 2021-04-27 ENCOUNTER — Ambulatory Visit: Payer: Medicare Other | Admitting: Neurology

## 2021-04-27 ENCOUNTER — Ambulatory Visit: Payer: Self-pay | Admitting: *Deleted

## 2021-04-27 NOTE — Telephone Encounter (Signed)
Patient is having acid reflux symptoms- feels drained, vertigo- tends to be worse since stroke. Patient states he has eye floaters.  Overall he states he is doing great- he states he has some concerns- feet, reflux, after stroke memory issues. Requests home nurse to monitor feet. Reason for Disposition  [1] Abdominal pain is intermittent AND [2] shoots into chest, with sour taste in mouth  Answer Assessment - Initial Assessment Questions 1. LOCATION: "Where does it hurt?"      Empty feeling in stomach,sore throat,foul taste 2. RADIATION: "Does the pain shoot anywhere else?" (e.g., chest, back)     no 3. ONSET: "When did the pain begin?" (e.g., minutes, hours or days ago)      No pain 4. SUDDEN: "Gradual or sudden onset?"     gradual Patient has scattered thoughts- changes subjects- hard to triage- patient main concern is appointment date, reflux symptoms- no pain with eating, unable to check BP at home, Request foot check- can he get order for nurse to check this? reports changes in feet since stroke-dry,flecking, cracking.  Protocols used: Abdominal Pain - Upper-A-AH

## 2021-04-28 ENCOUNTER — Telehealth: Payer: Self-pay | Admitting: Nurse Practitioner

## 2021-04-28 NOTE — Telephone Encounter (Signed)
Pt did call and was told that [name] would like to see earlier than 10/5 per triage note documented.  Pt stated needed to contact his caregiver first for a ride, but when ask me my name and did not come on his phone identifier he got extreme anxiety and said we could not continue to talk anymore as the [employer] maybe listening. I told him we would call back but he does not know whether it will be the real dr's office. This seem to really upset him, stating he could not be talking on this line.  You may return call, not successful in getting him scheduled. It sounds like it may not work as his Education officer, museum has not come the last couple days states due to her being short staffed

## 2021-04-29 NOTE — Telephone Encounter (Signed)
Routing to PCP and social worker.

## 2021-04-29 NOTE — Telephone Encounter (Signed)
Spoke with patient regarding appointment on 10/5.  He does want to keep the appointment but is having transportation issues, he states that his caregiver will not bring him to his appointment.  I advised that I would speak with the office Admin and have someone call him back regarding his situation and transportation to his appointment.

## 2021-04-29 NOTE — Telephone Encounter (Signed)
Lmom asking pt to call back to see if he wants an earlier appt.

## 2021-05-02 ENCOUNTER — Ambulatory Visit: Payer: Medicare Other | Admitting: Licensed Clinical Social Worker

## 2021-05-02 DIAGNOSIS — I152 Hypertension secondary to endocrine disorders: Secondary | ICD-10-CM

## 2021-05-02 DIAGNOSIS — I5032 Chronic diastolic (congestive) heart failure: Secondary | ICD-10-CM

## 2021-05-02 DIAGNOSIS — F419 Anxiety disorder, unspecified: Secondary | ICD-10-CM

## 2021-05-02 DIAGNOSIS — E1159 Type 2 diabetes mellitus with other circulatory complications: Secondary | ICD-10-CM

## 2021-05-02 DIAGNOSIS — E1169 Type 2 diabetes mellitus with other specified complication: Secondary | ICD-10-CM

## 2021-05-02 NOTE — Chronic Care Management (AMB) (Signed)
Chronic Care Management    Clinical Social Work Note  05/02/2021 Name: Evan [name] MRN: [id number] DOB: 07/04/1956  [name] Bergquist is a 65 y.o. year old male who is a primary care patient of [name], [name], NP. The CCM team was consulted to assist the patient with chronic disease management and/or care coordination needs related to: Mental Health Counseling and Resources.   Engaged with patient by telephone for follow up visit in response to provider referral for social work chronic care management and care coordination services.   Consent to Services:  The patient was given information about Chronic Care Management services, agreed to services, and gave verbal consent prior to initiation of services.  Please see initial visit note for detailed documentation.   Patient agreed to services and consent obtained.   Assessment: Review of patient past medical history, allergies, medications, and health status, including review of relevant consultants reports was performed today as part of a comprehensive evaluation and provision of chronic care management and care coordination services.     SDOH (Social Determinants of Health) assessments and interventions performed:  Transportation  Advanced Directives Status: Not addressed in this encounter.  CCM Care Plan  Allergies  Allergen Reactions   Penicillin G Hives   Sulfa Antibiotics Hives   Tiotropium    Zoloft [Sertraline Hcl] Other (See Comments)    Outpatient Encounter Medications as of 05/02/2021  Medication Sig   acetaminophen (TYLENOL) 500 MG tablet Take 500 mg by mouth every 6 (six) hours as needed.   albuterol (VENTOLIN HFA) 108 (90 Base) MCG/ACT inhaler Inhale 2 puffs into the lungs every 4 (four) hours as needed for wheezing or shortness of breath.   aspirin 81 MG chewable tablet Chew 1 tablet (81 mg total) by mouth daily.   atorvastatin (LIPITOR) 80 MG tablet Take 1 tablet (80 mg total) by mouth daily.   Blood Glucose  Monitoring Suppl (ONETOUCH VERIO) w/Device KIT Use to check blood sugar 3 to 4 times a day and document.  Please bring to visits for review.   budesonide-formoterol (SYMBICORT) 80-4.5 MCG/ACT inhaler Inhale 2 puffs into the lungs 2 (two) times daily.   Cholecalciferol 125 MCG (5000 UT) TABS Take 1 tablet (5,000 Units total) by mouth daily.   Elastic Bandages & Supports (MEDICAL COMPRESSION STOCKINGS) MISC 1 Package by Does not apply route daily.   empagliflozin (JARDIANCE) 10 MG TABS tablet Take 1 tablet (10 mg total) by mouth daily.   ezetimibe (ZETIA) 10 MG tablet Take 1 tablet (10 mg total) by mouth daily.   furosemide (LASIX) 40 MG tablet TAKE 2 TABLETS BY MOUTH ONCE EVERY MORNING AND 1 TABLET ONCE EVERY EVENING   glucose blood (ONETOUCH VERIO) test strip Use to check blood sugar 3 to 4 times a day.   metoprolol succinate (TOPROL XL) 50 MG 24 hr tablet Take 1 tablet (50 mg total) by mouth daily.   MODERNA COVID-19 VACCINE 100 MCG/0.5ML injection    nitroGLYCERIN (NITROSTAT) 0.4 MG SL tablet Place 1 tablet (0.4 mg total) under the tongue every 5 (five) minutes as needed for chest pain.   olmesartan (BENICAR) 5 MG tablet Take 2 tablets (10 mg total) by mouth daily.   Omega-3 Fatty Acids (OMEGA 3 500 PO) Take 500 mg by mouth daily.   OneTouch Delica Lancets 82L MISC Use to check blood sugar 2-3 times a day.   pantoprazole (PROTONIX) 40 MG tablet Take 1 tablet (40 mg total) by mouth daily.   predniSONE (DELTASONE)  20 MG tablet Take 3 tablets (60 mg total) by mouth daily. Start on 03/04/21   risperiDONE (RISPERDAL) 0.5 MG tablet Take 1 tablet (0.5 mg total) by mouth 2 (two) times daily.   rivaroxaban (XARELTO) 20 MG TABS tablet Take 1 tablet (20 mg total) by mouth at bedtime.   sharps container 1 each by Does not apply route as needed.   Skin Protectants, Misc. (EUCERIN) cream Apply topically as needed for dry skin.   triamcinolone cream (KENALOG) 0.1 % APPLY TO AFFECTED AREA(s) TWICE DAILY    vitamin B-12 (CYANOCOBALAMIN) 1000 MCG tablet Take 1 tablet (1,000 mcg total) by mouth daily.   No facility-administered encounter medications on file as of 05/02/2021.    Patient Active Problem List   Diagnosis Date Noted   Delusional disorder ([place]) 03/03/2021   Other thrombophilia ([place]) 12/25/2020   Vitamin D deficiency 12/04/2020   Vitamin B12 deficiency 12/04/2020   Stenosis of right carotid artery 12/03/2020   History of CVA (cerebrovascular accident) 11/06/2020   History of 2019 novel coronavirus disease (COVID-19) 09/27/2020   Atherosclerosis of aorta ([place]) 12/04/2019   Persistent atrial fibrillation ([place]) 09/12/2019   CAD (coronary artery disease) 09/12/2019   Chronic venous stasis 09/12/2019   Hoarding disorder 09/12/2019   Cervical spinal stenosis 09/12/2019   Diabetic retinopathy ([place]) 09/12/2019   COPD, mild ([place]) 09/12/2019   Osteoporosis 09/09/2019   History of prostate cancer 09/09/2019   Anxiety 08/10/2019   Obstructive sleep apnea on CPAP 08/10/2019   Hyperlipidemia associated with type 2 diabetes mellitus ([place]) 08/10/2019   Chronic diastolic CHF (congestive heart failure) ([place]) 01/09/2014   Esophageal dysmotility 09/12/2013   Type 2 diabetes mellitus with morbid obesity ([place]) 02/14/2012   Obesity, morbid (more than 100 lbs over ideal weight or BMI > 40) ([place]) 07/26/2011   OLD MYOCARDIAL INFARCTION 03/02/2010   Hypertension associated with diabetes ([place]) 03/20/2009   CORONARY ATHEROSCLEROSIS, ARTERY BYPASS GRAFT 03/20/2009    Conditions to be addressed/monitored: Anxiety and Depression; Mental Health Concerns   Care Plan : General Social Work (Adult)  Updates made by [name] D, LCSW since 05/02/2021 12:00 AM     Problem: Response to Treatment (Depression)      Goal: Response to Treatment Maximized   Start Date: 12/27/2020  This Visit's Progress: On track  Recent Progress: On track  Priority: High  Note:   Current barriers:   Acute Mental Health  needs related to Anxiety Housing barriers, Level of care concerns, and Mental Health Concerns  Needs Support, Education, and Care Coordination in order to meet unmet mental health needs. Clinical Goal(s): Over the next 120 days, patient will work with SW to reduce or manage symptoms of anxiety and increase knowledge and/or ability of: coping skills, healthy habits, self-management skills, and stress reduction.until connected for ongoing counseling.  Clinical Interventions:  Assessed patient's previous treatment, needs, coping skills, current treatment, support system and barriers to care Patient interviewed and appropriate assessments performed Provided mental health counseling with regard to managing mental health conditions Patient reports difficulty managing symptoms of anxiety triggered by chronic health conditions. Patient is upset about the way he is treated by multiple personal aids, stating that current aid is dishonest about why she does not show to work. CCM LCSW strongly encouraged patient to contact agency about his concerns to reach a resolution 09/08: [name] is back as his guide for 27 hrs a week [place] [phone] 09/26: Patient informed CCM LCSW that [name] will provide  transportation for his upcoming PCP appt scheduled 05/11/21. This information was verified by [name], pt's new aid that will be assisting with ADL's in the home on Mondays, Wednesdays, and Fridays  CCM LCSW discussed CBT strategies to assist with managing symptoms  Patient has scheduled transportation assistance through [employer] to run errands for next week. He is proud of this noting that he does not want to continue using aid for transportation needs Validation and encouragement was provided during call. CCM LCSW encouraged patient to continue reflecting on past experiences and current friendships to promote positive mood and stress management. States he has enjoyed playing cards  online with friends and praying to cope with stressors  CCM LCSW provided a supportive environment to allow patient to process his emotions Patient reports compliance with medications. Patient reports that his strong spiritual relationship keeps him encouraged to move forward instead of focusing on past events  Patient reports becoming "stir crazy" from staying at home for majority of the day. CCM LCSW discussed benefits of participating in [facility]. Patient reports hx of attending [facility]. States that he disenrolled because staff and participants believed he was "delusional" because he has close relationships with various celebrities. Patient is not interested in initiating psychiatry and/or counseling to assist with strengthening support system 09/08: Patient reports that he participates in a group weekly to promote socialization with others CCM LCSW discussed strategies to improve communication skills with aid to assist in preventing misunderstandings or offending others  CCM LCSW reviewed upcoming appointments with patient. He reports that he is utilizing [employer] for transportation. His aid has offered to bring patient to local stores to obtain cleaning supplies. Patient reports that he is aware that he can also utilize [employer] for transportation to medical appointments CCM LCSW discussed strategies to assist patient with memory concerns Other interventions: Solution-Focused Strategies, Mindfulness or Psychologist, educational, Active listening / Reflection utilized , Emotional Supportive Provided, Psychoeducation for mental health needs , Motivational Interviewing, Participation in counseling encouraged , Participation in support group encouraged , Consideration of in-home help encouraged , and Verbalization of feelings encouraged   Discussed plans with patient for ongoing care management follow up and provided patient with direct contact information for care management team Collaboration with PCP regarding  development and update of comprehensive plan of care as evidenced by provider attestation and co-signature Inter-disciplinary care team collaboration (see longitudinal plan of care) Patient Goals/Self-Care Activities: Over the next 120 days Attend all scheduled appointments with providers Contact office with any questions or concerns Utilize [facility] transportation number to assist with attending medical appointments Practice relaxation or meditation daily Practice positive thinking and self-talk      [name], MSW, [email or web address] Phone [phone] 2:37 PM

## 2021-05-02 NOTE — Patient Instructions (Signed)
Visit Information   Goals Addressed               This Visit's Progress     Patient Stated     SW-"I need more help." (pt-stated)   On track     Patient Goals/Self-Care Activities: Over the next 120 days Attend all scheduled appointments with providers Contact office with any questions or concerns [facility] transportation number to assist with attending medical appointments Practice relaxation or meditation daily Practice positive thinking and self-talk      Other     SW-Track and Manage My Symptoms-Depression   On track     Timeframe:  Long-Range Goal Priority:  High Start Date:   08/09/20                         Expected End Date: 06/06/21                    Follow Up Date- 05/16/21  Patient Goals/Self-Care Activities: Over the next 120 days Attend all scheduled appointments with providers Contact office with any questions or concerns Utilize [facility] transportation number to assist with attending medical appointments Practice relaxation or meditation daily Practice positive thinking and self-talk        Patient verbalizes understanding of instructions provided today and agrees to view in [place].   Telephone follow up appointment with care management team member scheduled for:05/16/21  Evan [name], MSW, [name] Phone [phone] 2:38 PM

## 2021-05-03 ENCOUNTER — Telehealth: Payer: Medicare Other | Admitting: General Practice

## 2021-05-03 ENCOUNTER — Ambulatory Visit: Payer: Self-pay

## 2021-05-03 DIAGNOSIS — Z8673 Personal history of transient ischemic attack (TIA), and cerebral infarction without residual deficits: Secondary | ICD-10-CM

## 2021-05-03 DIAGNOSIS — F339 Major depressive disorder, recurrent, unspecified: Secondary | ICD-10-CM

## 2021-05-03 DIAGNOSIS — I25118 Atherosclerotic heart disease of native coronary artery with other forms of angina pectoris: Secondary | ICD-10-CM

## 2021-05-03 DIAGNOSIS — E1169 Type 2 diabetes mellitus with other specified complication: Secondary | ICD-10-CM

## 2021-05-03 DIAGNOSIS — E1159 Type 2 diabetes mellitus with other circulatory complications: Secondary | ICD-10-CM

## 2021-05-03 DIAGNOSIS — F22 Delusional disorders: Secondary | ICD-10-CM

## 2021-05-03 DIAGNOSIS — F419 Anxiety disorder, unspecified: Secondary | ICD-10-CM

## 2021-05-03 NOTE — Patient Instructions (Signed)
Visit Information  PATIENT GOALS:  Goals Addressed             This Visit's Progress    RNCM: Keep or Improve My Strength-Stroke       Timeframe:  Short-Term Goal Priority:  High Start Date:     05-03-2021                        Expected End Date:    05-03-2022                   Follow Up Date: 07-12-2021   - eat healthy to increase strength - increase activity or exercise time a little every week - know who to call for help if I fall    Why is this important?   Before the stroke you probably did not think much about being safe when you are up and about.  Now, it may be harder for you to get around.  It may also be easier for you to trip or fall.  It is common to have muscle weakness after a stroke. You may also feel like you cannot control an arm or leg.  It will be helpful to work with a physical therapist to get your strength and muscle control back.  It is good to stay as active as you can. Walking and stretching help you stay strong and flexible.  The physical therapist will develop an exercise program just for you.     05-03-2021: the patient has a lot of issues with memory loss and repeating stories. Also has a history of delusional thoughts. He easily gets off of track and has to be redirected during the call several time. Education on writing things down to discuss with the provider at his upcoming visit. Will continue to monitor for changes. Currently working with the LCSW for expressed needs as well.      RNCM: Make and Keep All Appointments       Timeframe:  Long-Range Goal Priority:  High Start Date:     10-15-2020                        Expected End Date:        01-04-2022               Follow Up Date 07-12-2021   - arrange a ride through an agency 1 week before appointment - ask family or friend for a ride - call to cancel if needed - keep a calendar with prescription refill dates - keep a calendar with appointment dates - learn the bus route - use public  transportation    Why is this important?   Part of staying healthy is seeing the doctor for follow-up care.  If you forget your appointments, there are some things you can do to stay on track.    Notes: Has a colonoscopy for 10-22-2020 and podiatry appointment 10-26-2020. 01-04-2021: The patient reminded of his appointment with pcp on 01-07-2021 at 11 am.  The patient states he has transportation. 03-01-2021: The patient has been to the ER 2 times in July due to palpitations. States its because of his anxiety and stress he is under at his place he lives. Discussed keeping appointments and following recommendations from his providers. 05-03-2021: The patient has an appointment on 05-11-2021 at 11 am. Reminded the patient of the appointment. Is unsure of transportation. The patient has caregivers that come  in but states they are unreliable. He states he will walk if he has to do so.  Reviewed with the patient the transportation [facility] provides.         Patient verbalizes understanding of instructions provided today and agrees to view in [place].   Telephone follow up appointment with care management team member scheduled for: 07-12-2021 at 0900 am  Noreene Larsson RN, MSN, [facility] Mobile: [phone]

## 2021-05-03 NOTE — Chronic Care Management (AMB) (Signed)
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 and clock use encouraged - cognitive-stimulating activities promoted - consistent daily routine encouraged - extra time for response allowed- 05-03-2021: Encouraged the patient to write down questions to ask at his next appointment. The patient easily gets off track with is thinking. Discussed ways to remember appointments  - medication list reviewed - memory aid use encouraged - regular activity or exercise promoted - repetition utilized - social relationships promoted - written communication utilized  Follow Up Plan: Telephone follow up appointment with care management team member scheduled for: 07-12-2021 at 0900am         Plan:Telephone follow up appointment with care management team member scheduled for:  07-12-2021 at 0900 am  Noreene Larsson RN, MSN, [facility] Mobile: [phone]

## 2021-05-06 DIAGNOSIS — I152 Hypertension secondary to endocrine disorders: Secondary | ICD-10-CM | POA: Diagnosis not present

## 2021-05-06 DIAGNOSIS — F339 Major depressive disorder, recurrent, unspecified: Secondary | ICD-10-CM

## 2021-05-06 DIAGNOSIS — E1159 Type 2 diabetes mellitus with other circulatory complications: Secondary | ICD-10-CM

## 2021-05-06 DIAGNOSIS — E785 Hyperlipidemia, unspecified: Secondary | ICD-10-CM | POA: Diagnosis not present

## 2021-05-06 DIAGNOSIS — I5032 Chronic diastolic (congestive) heart failure: Secondary | ICD-10-CM

## 2021-05-06 DIAGNOSIS — E1169 Type 2 diabetes mellitus with other specified complication: Secondary | ICD-10-CM | POA: Diagnosis not present

## 2021-05-06 DIAGNOSIS — I25118 Atherosclerotic heart disease of native coronary artery with other forms of angina pectoris: Secondary | ICD-10-CM

## 2021-05-10 ENCOUNTER — Ambulatory Visit: Payer: Self-pay | Admitting: *Deleted

## 2021-05-10 ENCOUNTER — Other Ambulatory Visit: Payer: Self-pay | Admitting: Nurse Practitioner

## 2021-05-10 NOTE — Telephone Encounter (Signed)
Pt evasive historian, hyper verbal. Reports area under right breast "Raw, bleeding x 16-7 days." States "From some of my medication.Not sure which one." States painful, no itching, blistered.Reports has been applying Sarna, athletes foot powder, "Did apply Vicks but I don't recommend it." States "Just want everyone to know it's all working ok." Referring to the [place]. States believes rash occurred "During excessive heat wave." Has appt 05/11/21, cancelled due to lack of transportation. Pt difficult to redirect. Assured pt NT would route to practice for PCPs review. Attempted to give care advise, "Everything is working OK with the creams."

## 2021-05-10 NOTE — Telephone Encounter (Signed)
Reason for Disposition  Localized rash present > 7 days  Answer Assessment - Initial Assessment Questions 1. APPEARANCE of RASH: "Describe the rash."      "Raw, red ,blistered" 2. LOCATION: "Where is the rash located?"      Under right breast 3. NUMBER: "How many spots are there?"      Numerous 4. SIZE: "How big are the spots?" (Inches, centimeters or compare to size of a coin)      "Raw" 5. ONSET: "When did the rash start?"      6 days ago 6. ITCHING: "Does the rash itch?" If Yes, ask: "How bad is the itch?"  (Scale 0-10; or none, mild, moderate, severe)     no 7. PAIN: "Does the rash hurt?" If Yes, ask: "How bad is the pain?"  (Scale 0-10; or none, mild, moderate, severe)    - NONE (0): no pain    - MILD (1-3): doesn't interfere with normal activities     - MODERATE (4-7): interferes with normal activities or awakens from sleep     - SEVERE (8-10): excruciating pain, unable to do any normal activities     Varies 8. OTHER SYMPTOMS: "Do you have any other symptoms?" (e.g., fever)     None, Bottom of feet peeling.  Protocols used: Rash or Redness - Localized-A-AH

## 2021-05-11 ENCOUNTER — Ambulatory Visit: Payer: Medicare Other | Admitting: Nurse Practitioner

## 2021-05-11 ENCOUNTER — Ambulatory Visit (INDEPENDENT_AMBULATORY_CARE_PROVIDER_SITE_OTHER): Payer: Medicare Other | Admitting: Nurse Practitioner

## 2021-05-11 ENCOUNTER — Encounter: Payer: Self-pay | Admitting: Nurse Practitioner

## 2021-05-11 ENCOUNTER — Other Ambulatory Visit: Payer: Self-pay

## 2021-05-11 DIAGNOSIS — E538 Deficiency of other specified B group vitamins: Secondary | ICD-10-CM | POA: Diagnosis not present

## 2021-05-11 DIAGNOSIS — E1169 Type 2 diabetes mellitus with other specified complication: Secondary | ICD-10-CM

## 2021-05-11 DIAGNOSIS — F419 Anxiety disorder, unspecified: Secondary | ICD-10-CM

## 2021-05-11 DIAGNOSIS — E1159 Type 2 diabetes mellitus with other circulatory complications: Secondary | ICD-10-CM | POA: Diagnosis not present

## 2021-05-11 DIAGNOSIS — G4733 Obstructive sleep apnea (adult) (pediatric): Secondary | ICD-10-CM | POA: Diagnosis not present

## 2021-05-11 DIAGNOSIS — Z9989 Dependence on other enabling machines and devices: Secondary | ICD-10-CM

## 2021-05-11 DIAGNOSIS — J449 Chronic obstructive pulmonary disease, unspecified: Secondary | ICD-10-CM | POA: Diagnosis not present

## 2021-05-11 DIAGNOSIS — Z8673 Personal history of transient ischemic attack (TIA), and cerebral infarction without residual deficits: Secondary | ICD-10-CM

## 2021-05-11 DIAGNOSIS — F22 Delusional disorders: Secondary | ICD-10-CM

## 2021-05-11 DIAGNOSIS — Z23 Encounter for immunization: Secondary | ICD-10-CM

## 2021-05-11 DIAGNOSIS — I152 Hypertension secondary to endocrine disorders: Secondary | ICD-10-CM

## 2021-05-11 DIAGNOSIS — D6869 Other thrombophilia: Secondary | ICD-10-CM

## 2021-05-11 DIAGNOSIS — I4819 Other persistent atrial fibrillation: Secondary | ICD-10-CM | POA: Diagnosis not present

## 2021-05-11 DIAGNOSIS — E785 Hyperlipidemia, unspecified: Secondary | ICD-10-CM | POA: Diagnosis not present

## 2021-05-11 DIAGNOSIS — I5032 Chronic diastolic (congestive) heart failure: Secondary | ICD-10-CM

## 2021-05-11 LAB — BAYER DCA HB A1C WAIVED: HB A1C (BAYER DCA - WAIVED): 6.2 % — ABNORMAL HIGH (ref 4.8–5.6)

## 2021-05-11 MED ORDER — EMPAGLIFLOZIN 10 MG PO TABS: 10.0000 mg | ORAL_TABLET | Freq: Every day | ORAL | 4 refills | Status: DC

## 2021-05-11 MED ORDER — FUROSEMIDE 40 MG PO TABS: ORAL_TABLET | ORAL | 4 refills | Status: DC

## 2021-05-11 MED ORDER — RISPERIDONE 0.5 MG PO TABS: 0.5000 mg | ORAL_TABLET | Freq: Two times a day (BID) | ORAL | 4 refills | Status: DC

## 2021-05-11 MED ORDER — NYSTATIN 100000 UNIT/GM EX POWD: 1.0000 "application " | Freq: Three times a day (TID) | CUTANEOUS | 0 refills | Status: DC

## 2021-05-11 MED ORDER — METOPROLOL SUCCINATE ER 50 MG PO TB24: 50.0000 mg | ORAL_TABLET | Freq: Every day | ORAL | 4 refills | Status: DC

## 2021-05-11 MED ORDER — EZETIMIBE 10 MG PO TABS: 10.0000 mg | ORAL_TABLET | Freq: Every day | ORAL | 4 refills | Status: DC

## 2021-05-11 NOTE — Telephone Encounter (Signed)
Patient had appointment with [name], NP today.

## 2021-05-11 NOTE — Patient Instructions (Signed)
Diabetes Mellitus and Nutrition, Adult When you have diabetes, or diabetes mellitus, it is very important to have healthy eating habits because your blood sugar (glucose) levels are greatly affected by what you eat and drink. Eating healthy foods in the right amounts, at about the same times every day, can help you:  Control your blood glucose.  Lower your risk of heart disease.  Improve your blood pressure.  Reach or maintain a healthy weight. What can affect my meal plan? Every person with diabetes is different, and each person has different needs for a meal plan. Your health care provider may recommend that you work with a dietitian to make a meal plan that is best for you. Your meal plan may vary depending on factors such as:  The calories you need.  The medicines you take.  Your weight.  Your blood glucose, blood pressure, and cholesterol levels.  Your activity level.  Other health conditions you have, such as heart or kidney disease. How do carbohydrates affect me? Carbohydrates, also called carbs, affect your blood glucose level more than any other type of food. Eating carbs naturally raises the amount of glucose in your blood. Carb counting is a method for keeping track of how many carbs you eat. Counting carbs is important to keep your blood glucose at a healthy level, especially if you use insulin or take certain oral diabetes medicines. It is important to know how many carbs you can safely have in each meal. This is different for every person. Your dietitian can help you calculate how many carbs you should have at each meal and for each snack. How does alcohol affect me? Alcohol can cause a sudden decrease in blood glucose (hypoglycemia), especially if you use insulin or take certain oral diabetes medicines. Hypoglycemia can be a life-threatening condition. Symptoms of hypoglycemia, such as sleepiness, dizziness, and confusion, are similar to symptoms of having too much  alcohol.  Do not drink alcohol if: ? Your health care provider tells you not to drink. ? You are pregnant, may be pregnant, or are planning to become pregnant.  If you drink alcohol: ? Do not drink on an empty stomach. ? Limit how much you use to:  0-1 drink a day for women.  0-2 drinks a day for men. ? Be aware of how much alcohol is in your drink. In the [place] one drink equals one 12 oz bottle of beer (355 mL), one 5 oz glass of wine (148 mL), or one 1 oz glass of hard liquor (44 mL). ? Keep yourself hydrated with water, diet soda, or unsweetened iced tea.  Keep in mind that regular soda, juice, and other mixers may contain a lot of sugar and must be counted as carbs. What are tips for following this plan? Reading food labels  Start by checking the serving size on the "Nutrition Facts" label of packaged foods and drinks. The amount of calories, carbs, fats, and other nutrients listed on the label is based on one serving of the item. Many items contain more than one serving per package.  Check the total grams (g) of carbs in one serving. You can calculate the number of servings of carbs in one serving by dividing the total carbs by 15. For example, if a food has 30 g of total carbs per serving, it would be equal to 2 servings of carbs.  Check the number of grams (g) of saturated fats and trans fats in one serving. Choose foods that have   a low amount or none of these fats.  Check the number of milligrams (mg) of salt (sodium) in one serving. Most people should limit total sodium intake to less than 2,300 mg per day.  Always check the nutrition information of foods labeled as "low-fat" or "nonfat." These foods may be higher in added sugar or refined carbs and should be avoided.  Talk to your dietitian to identify your daily goals for nutrients listed on the label. Shopping  Avoid buying canned, pre-made, or processed foods. These foods tend to be high in fat, sodium, and added  sugar.  Shop around the outside edge of the grocery store. This is where you will most often find fresh fruits and vegetables, bulk grains, fresh meats, and fresh dairy. Cooking  Use low-heat cooking methods, such as baking, instead of high-heat cooking methods like deep frying.  Cook using healthy oils, such as olive, canola, or sunflower oil.  Avoid cooking with butter, cream, or high-fat meats. Meal planning  Eat meals and snacks regularly, preferably at the same times every day. Avoid going long periods of time without eating.  Eat foods that are high in fiber, such as fresh fruits, vegetables, beans, and whole grains. Talk with your dietitian about how many servings of carbs you can eat at each meal.  Eat 4-6 oz (112-168 g) of lean protein each day, such as lean meat, chicken, fish, eggs, or tofu. One ounce (oz) of lean protein is equal to: ? 1 oz (28 g) of meat, chicken, or fish. ? 1 egg. ?  cup (62 g) of tofu.  Eat some foods each day that contain healthy fats, such as avocado, nuts, seeds, and fish.   What foods should I eat? Fruits Berries. Apples. Oranges. Peaches. Apricots. Plums. Grapes. Mango. Papaya. Pomegranate. Kiwi. Cherries. Vegetables Lettuce. Spinach. Leafy greens, including kale, chard, collard greens, and mustard greens. Beets. Cauliflower. Cabbage. Broccoli. Carrots. Green beans. Tomatoes. Peppers. Onions. Cucumbers. Brussels sprouts. Grains Whole grains, such as whole-wheat or whole-grain bread, crackers, tortillas, cereal, and pasta. Unsweetened oatmeal. Quinoa. Brown or wild rice. Meats and other proteins Seafood. Poultry without skin. Lean cuts of poultry and beef. Tofu. Nuts. Seeds. Dairy Low-fat or fat-free dairy products such as milk, yogurt, and cheese. The items listed above may not be a complete list of foods and beverages you can eat. Contact a dietitian for more information. What foods should I avoid? Fruits Fruits canned with  syrup. Vegetables Canned vegetables. Frozen vegetables with butter or cream sauce. Grains Refined white flour and flour products such as bread, pasta, snack foods, and cereals. Avoid all processed foods. Meats and other proteins Fatty cuts of meat. Poultry with skin. Breaded or fried meats. Processed meat. Avoid saturated fats. Dairy Full-fat yogurt, cheese, or milk. Beverages Sweetened drinks, such as soda or iced tea. The items listed above may not be a complete list of foods and beverages you should avoid. Contact a dietitian for more information. Questions to ask a health care provider  Do I need to meet with a diabetes educator?  Do I need to meet with a dietitian?  What number can I call if I have questions?  When are the best times to check my blood glucose? Where to find more information:  American Diabetes Association: diabetes.org  Academy of Nutrition and Dietetics: www.eatright.org  National Institute of Diabetes and Digestive and Kidney Diseases: www.niddk.nih.gov  Association of Diabetes Care and Education Specialists: www.diabeteseducator.org Summary  It is important to have healthy eating   habits because your blood sugar (glucose) levels are greatly affected by what you eat and drink.  A healthy meal plan will help you control your blood glucose and maintain a healthy lifestyle.  Your health care provider may recommend that you work with a dietitian to make a meal plan that is best for you.  Keep in mind that carbohydrates (carbs) and alcohol have immediate effects on your blood glucose levels. It is important to count carbs and to use alcohol carefully. This information is not intended to replace advice given to you by your health care provider. Make sure you discuss any questions you have with your health care provider. Document Revised: 07/01/2019 Document Reviewed: 07/01/2019 Elsevier Patient Education  2021 [employer]  

## 2021-05-11 NOTE — Assessment & Plan Note (Signed)
Refer to anxiety plan of care -- he continues to refuse psychiatry referral and assessment.

## 2021-05-11 NOTE — Assessment & Plan Note (Signed)
Chronic, ongoing. Euvolemic today.  Continue collaboration with cardiology and HF clinic. Recommend he take medication consistently. Recommend: - Reminded to call for an overnight weight gain of >2 pounds or a weekly weight weight of >5 pounds - not adding salt to his food and has been reading food labels. Reviewed the importance of keeping daily sodium intake to 2000mg  daily  - Avoid Ibuprofen products. - Return in 3 months for visit and labs.

## 2021-05-11 NOTE — Assessment & Plan Note (Signed)
Acute 9 mm right thalamic infarct on 11/06/2020.  At this time continue current medication regimen and will adjust as needed.  Discussed with him stroke prevention goals.  Referral to neurology & vascular for further assessment placed past visits, but has not attended -- refuses. He declines PT/OT in home.

## 2021-05-11 NOTE — Assessment & Plan Note (Signed)
Chronic, ongoing.  BP initially above goal and recheck at goal -- irritated initially.  Continue Olmesartan 10 MG daily, is tolerating, and continue Metoprolol + Lasix.  Recommend checking BP at home a few mornings a week at home and documenting + focus on DASH diet.  Continue collaboration with cardiology.  Return in 3 months.  CMP today.

## 2021-05-11 NOTE — Assessment & Plan Note (Signed)
On Xarelto for A-Fib, continue this regimen and check CBC every 6 months.

## 2021-05-11 NOTE — Assessment & Plan Note (Signed)
Chronic, ongoing with A1C today 6.2% and urine ALB 30 and A:C 30-300 February 2022.  Continue ARB for kidney protection.  Avoid ACE due to COPD.  Continue current Jardiance, benefit with his HF.  A1c and CMP today.  Recommend he check BS at least 3 mornings a week at home.  Return to office in 3 months.  Continue pill packs for consistent medication adherence.

## 2021-05-11 NOTE — Assessment & Plan Note (Signed)
BMI 35.25 with T2DM, HTN.  Recommended eating smaller high protein, low fat meals more frequently and exercising 30 mins a day 5 times a week with a goal of 10-15lb weight loss in the next 3 months. Patient voiced their understanding and motivation to adhere to these recommendations.

## 2021-05-11 NOTE — Assessment & Plan Note (Signed)
Chronic, ongoing.  Continue current medication regimen and adjust as needed.  Return in 3 months for visit.  Lipid panel today.

## 2021-05-11 NOTE — Assessment & Plan Note (Signed)
Chronic, ongoing.  Recommend continue 100% use of CPAP.

## 2021-05-11 NOTE — Assessment & Plan Note (Signed)
Chronic, ongoing.  Continue collaboration with cardiology and current medication regimen, including Metoprolol and Xarelto.  Labs today.

## 2021-05-11 NOTE — Progress Notes (Signed)
BP 136/82 (BP Location: Left Arm, Patient Position: Sitting)   Pulse 92   Temp 98.4 F (36.9 C) (Oral)   Wt 211 lb 12.8 oz (96.1 kg)   SpO2 97%   BMI 35.25 kg/m    Subjective:    Patient ID: Evan [name], male    DOB: August 05, 1956, 65 y.o.   MRN: [id number]  HPI: Evan [name] is a 65 y.o. male  Chief Complaint  Patient presents with   Follow-up    Patient states he is here to follow up with his vitals and he has a question he would like to discuss with the provider.    DIABETES Continues on Jardiance 10 MG daily.  A1C in July 6.3%. B12 in July 2022 = 367 and Vitamin D 23.4 -- supplements were sent previous visit to place in pill packs which he reports he is taking daily. Hypoglycemic episodes:no Polydipsia/polyuria: no Visual disturbance: no Chest pain: no Paresthesias: no Glucose Monitoring: no             Accucheck frequency: Not often -- he reports Evan [name] is going to get him a new thing to help check sugars             Fasting glucose:             Post prandial:             Evening:             Before meals: Taking Insulin?: no             Long acting insulin:             Short acting insulin: Blood Pressure Monitoring: not checking Retinal Examination: Up to Date Foot Exam: Up to Date Pneumovax: unknown Influenza: unknown Aspirin: yes    HYPERTENSION / HYPERLIPIDEMIA Saw Dr. [name] on 01/17/21.  Recent EF 11/06/20 was 50 to 55%.  Saw HF Clinic on 03/31/2020, no changes made.  Uses CPAP at home about 100% of the time per his report.  No recent NTG use.    Currently taking ASA, Atorvastatin, Zetia, Lasix, Metoprolol, Olmesartan. Satisfied with current treatment? yes Duration of hypertension: chronic BP monitoring frequency: not checking BP range:  BP medication side effects: no Duration of hyperlipidemia: chronic Cholesterol medication side effects: no Cholesterol supplements: none Medication compliance: good compliance Aspirin: yes Recent stressors:  no Recurrent headaches: no Visual changes: no Palpitations: no Dyspnea: no Chest pain: no Lower extremity edema: no Dizzy/lightheaded: no   COPD Never a smoker -- has Symbicort but he reports he does not consistently use this and does not use Albuterol. COPD status: stable Satisfied with current treatment?: yes Oxygen use: no Dyspnea frequency: occasional at baseline Cough frequency: occasional at baseline Rescue inhaler frequency:  rarely Limitation of activity: no Productive cough: none Last Spirometry: unknown Pneumovax: given today Influenza: Up to Date   ANXIETY/STRESS Continues on Risperdal BID for mood, states he takes this daily (has pill packs).    History: When he attended [facility] there was concern he did not take medication consistently and about his delusional behaviors, he does not want to return to [facility] as he did not like they did not "believe I knew these people and called me delusional".  Reports this hurt his feelings.     He continues to refuse any referral to psychiatry or return to [facility] and continues to discuss his friendships with multiple different famous people -- Evan [name] and Evan [name].  Duration:stable Anxious mood: no  Excessive worrying: no Irritability: no  Sweating: no Nausea: no Palpitations:no Hyperventilation: no Panic attacks: no Agoraphobia: no  Obscessions/compulsions: yes Depressed mood: no Depression screen [employer] 2/9 05/11/2021 02/25/2021 02/08/2021 12/03/2020 10/06/2020  Decreased Interest - 0 0 0 0  Down, Depressed, Hopeless 0 0 1 0 0  PHQ - 2 Score 0 0 1 0 0  Altered sleeping 1 - 0 0 2  Tired, decreased energy 0 - 1 0 0  Change in appetite 0 - 0 0 0  Feeling bad or failure about yourself  0 - 0 0 0  Trouble concentrating 0 - 0 0 0  Moving slowly or fidgety/restless 0 - 0 0 0  Suicidal thoughts 0 - 0 0 0  PHQ-9 Score 1 - 2 0 2  Difficult doing work/chores Not difficult at all - Not difficult at all Not difficult at all -  Some  recent data might be hidden   GAD 7 : Generalized Anxiety Score 05/11/2021 02/08/2021 04/08/2020 03/31/2020  Nervous, Anxious, on Edge 1 1 0 0  Control/stop worrying 1 0 1 0  Worry too much - different things 1 0 1 0  Trouble relaxing 0 0 0 0  Restless 0 0 0 0  Easily annoyed or irritable 0 1 0 0  Afraid - awful might happen 1 0 0 0  Total GAD 7 Score 4 2 2  0  Anxiety Difficulty Not difficult at all Not difficult at all Not difficult at all Not difficult at all   Relevant past medical, surgical, family and social history reviewed and updated as indicated. Interim medical history since our last visit reviewed. Allergies and medications reviewed and updated.  Review of Systems  Constitutional:  Negative for activity change, diaphoresis, fatigue and fever.  Respiratory:  Negative for cough, chest tightness, shortness of breath and wheezing.   Cardiovascular:  Positive for leg swelling (baseline per his report). Negative for chest pain and palpitations.  Gastrointestinal: Negative.   Endocrine: Negative for cold intolerance, heat intolerance, polydipsia, polyphagia and polyuria.  Neurological: Negative.   Psychiatric/Behavioral:  Negative for decreased concentration, self-injury, sleep disturbance and suicidal ideas. The patient is not nervous/anxious.    Per HPI unless specifically indicated above     Objective:    BP 136/82 (BP Location: Left Arm, Patient Position: Sitting)   Pulse 92   Temp 98.4 F (36.9 C) (Oral)   Wt 211 lb 12.8 oz (96.1 kg)   SpO2 97%   BMI 35.25 kg/m   Wt Readings from Last 3 Encounters:  05/11/21 211 lb 12.8 oz (96.1 kg)  04/10/21 (!) 337 lb (152.9 kg)  03/03/21 228 lb (103.4 kg)    Physical Exam Vitals and nursing note reviewed.  Constitutional:      General: He is awake. He is not in acute distress.    Appearance: He is well-developed. He is morbidly obese. He is not ill-appearing.  HENT:     Head: Normocephalic and atraumatic.     Right Ear: Hearing  normal. No drainage.     Left Ear: Hearing normal. No drainage.  Eyes:     General: Lids are normal.        Right eye: No discharge.        Left eye: No discharge.     Pupils: Pupils are equal, round, and reactive to light.  Neck:     Thyroid: No thyromegaly.     Vascular: No carotid bruit or JVD.  Trachea: Trachea normal.  Cardiovascular:     Rate and Rhythm: Normal rate and regular rhythm.     Pulses:          Dorsalis pedis pulses are 1+ on the right side and 1+ on the left side.       Posterior tibial pulses are 1+ on the right side and 1+ on the left side.     Heart sounds: Normal heart sounds, S1 normal and S2 normal. No murmur heard.   No gallop.     Comments: Varicose veins bilateral legs with hemosiderin staining bilaterally R>L.  No open wounds noted. Pulmonary:     Effort: Pulmonary effort is normal. No accessory muscle usage or respiratory distress.     Breath sounds: Normal breath sounds.  Abdominal:     General: Bowel sounds are normal.     Palpations: Abdomen is soft.     Tenderness: There is no abdominal tenderness.  Musculoskeletal:        General: Normal range of motion.     Cervical back: Normal range of motion and neck supple.     Right lower leg: Edema (trace) present.     Left lower leg: Edema (trace) present.  Lymphadenopathy:     Cervical: No cervical adenopathy.  Skin:    General: Skin is warm and dry.     Capillary Refill: Capillary refill takes less than 2 seconds.  Neurological:     Mental Status: He is alert and oriented to person, place, and time.     Gait: Gait is intact.  Psychiatric:        Attention and Perception: Attention normal.        Mood and Affect: Mood normal.        Speech: Speech normal.        Behavior: Behavior normal. Behavior is cooperative.     Comments: Tangential thought processes per baseline.  Talked about his friendship with Evan [name] and Evan [name].  This is baseline for him -- continues to refuse seeing  psychiatry.   Results for orders placed or performed in visit on 05/11/21  Bayer DCA Hb A1c Waived  Result Value Ref Range   HB A1C (BAYER DCA - WAIVED) 6.2 (H) 4.8 - 5.6 %      Assessment & Plan:   Problem List Items Addressed This Visit       Cardiovascular and Mediastinum   Hypertension associated with diabetes ([place])    Chronic, ongoing.  BP initially above goal and recheck at goal -- irritated initially.  Continue Olmesartan 10 MG daily, is tolerating, and continue Metoprolol + Lasix.  Recommend checking BP at home a few mornings a week at home and documenting + focus on DASH diet.  Continue collaboration with cardiology.  Return in 3 months.  CMP today.      Relevant Medications   empagliflozin (JARDIANCE) 10 MG TABS tablet   ezetimibe (ZETIA) 10 MG tablet   furosemide (LASIX) 40 MG tablet   metoprolol succinate (TOPROL XL) 50 MG 24 hr tablet   Chronic diastolic CHF (congestive heart failure) (HCC)    Chronic, ongoing. Euvolemic today.  Continue collaboration with cardiology and HF clinic. Recommend he take medication consistently. Recommend: - Reminded to call for an overnight weight gain of >2 pounds or a weekly weight weight of >5 pounds - not adding salt to his food and has been reading food labels. Reviewed the importance of keeping daily sodium intake to 2000mg  daily  -  Avoid Ibuprofen products. - Return in 3 months for visit and labs.      Relevant Medications   ezetimibe (ZETIA) 10 MG tablet   furosemide (LASIX) 40 MG tablet   metoprolol succinate (TOPROL XL) 50 MG 24 hr tablet   Persistent atrial fibrillation (HCC)    Chronic, ongoing.  Continue collaboration with cardiology and current medication regimen, including Metoprolol and Xarelto.  Labs today.      Relevant Medications   ezetimibe (ZETIA) 10 MG tablet   furosemide (LASIX) 40 MG tablet   metoprolol succinate (TOPROL XL) 50 MG 24 hr tablet     Respiratory   Obstructive sleep apnea on CPAP     Chronic, ongoing.  Recommend continue 100% use of CPAP.      COPD, mild (HCC)    Chronic, stable.  Continue current inhaler regimen and adjust as needed, recommend he use inhaler daily vs as needed.  Unable to attain spirometry, he is unable to follow instructions for use.  Return in 3 months.        Endocrine   Type 2 diabetes mellitus with morbid obesity ([place]) - Primary    Chronic, ongoing with A1C today 6.2% and urine ALB 30 and A:C 30-300 February 2022.  Continue ARB for kidney protection.  Avoid ACE due to COPD.  Continue current Jardiance, benefit with his HF.  A1c and CMP today.  Recommend he check BS at least 3 mornings a week at home.  Return to office in 3 months.  Continue pill packs for consistent medication adherence.      Relevant Medications   empagliflozin (JARDIANCE) 10 MG TABS tablet   Other Relevant Orders   Bayer DCA Hb A1c Waived (Completed)   Hyperlipidemia associated with type 2 diabetes mellitus (HCC)    Chronic, ongoing.  Continue current medication regimen and adjust as needed.  Return in 3 months for visit.  Lipid panel today.      Relevant Medications   empagliflozin (JARDIANCE) 10 MG TABS tablet   ezetimibe (ZETIA) 10 MG tablet   furosemide (LASIX) 40 MG tablet   metoprolol succinate (TOPROL XL) 50 MG 24 hr tablet   Other Relevant Orders   Bayer DCA Hb A1c Waived (Completed)   Comprehensive metabolic panel   Lipid Panel w/o Chol/HDL Ratio     Hematopoietic and Hemostatic   Other thrombophilia (HCC)    On Xarelto for A-Fib, continue this regimen and check CBC every 6 months.        Other   Obesity, morbid (more than 100 lbs over ideal weight or BMI > 40) (HCC)    BMI 35.25 with T2DM, HTN.  Recommended eating smaller high protein, low fat meals more frequently and exercising 30 mins a day 5 times a week with a goal of 10-15lb weight loss in the next 3 months. Patient voiced their understanding and motivation to adhere to these recommendations.        Relevant Medications   empagliflozin (JARDIANCE) 10 MG TABS tablet   Anxiety    Chronic, ongoing with ongoing grandiose thought processes present, ?more schizophrenia on presentation -- high suspicion for this.  Continue current medication regimen and adjust as needed.  Has history on review old records of missing doses Risperdal, will continue current medication at this time and adjust as needed -- pill packs have been beneficial for patient.  Continue collaboration with CCM SW.  Would benefit from psychiatry and therapy, but he continues to refuse this.  Will continue  to encourage this and discuss with him.      History of CVA (cerebrovascular accident)    Acute 9 mm right thalamic infarct on 11/06/2020.  At this time continue current medication regimen and will adjust as needed.  Discussed with him stroke prevention goals.  Referral to neurology & vascular for further assessment placed past visits, but has not attended -- refuses. He declines PT/OT in home.      Vitamin B12 deficiency    Noted recent labs and supplement sent in for pill packs, continue this and recheck level today.      Relevant Orders   Vitamin B12   Delusional disorder ([place])    Refer to anxiety plan of care -- he continues to refuse psychiatry referral and assessment.      Other Visit Diagnoses     Flu vaccine need       Flu vaccine today   Relevant Orders   Flu Vaccine QUAD High Dose(Fluad)       Time: 30 minutes, >50% spent counseling/or care coordination  Follow up plan: Return in about 3 months (around 08/11/2021) for T2DM, HTN/HLD, MOOD, HF, COPD.

## 2021-05-11 NOTE — Assessment & Plan Note (Signed)
Chronic, stable.  Continue current inhaler regimen and adjust as needed, recommend he use inhaler daily vs as needed.  Unable to attain spirometry, he is unable to follow instructions for use.  Return in 3 months.

## 2021-05-11 NOTE — Assessment & Plan Note (Signed)
Noted recent labs and supplement sent in for pill packs, continue this and recheck level today.

## 2021-05-11 NOTE — Assessment & Plan Note (Signed)
Chronic, ongoing with ongoing grandiose thought processes present, ?more schizophrenia on presentation -- high suspicion for this.  Continue current medication regimen and adjust as needed.  Has history on review old records of missing doses Risperdal, will continue current medication at this time and adjust as needed -- pill packs have been beneficial for patient.  Continue collaboration with CCM SW.  Would benefit from psychiatry and therapy, but he continues to refuse this.  Will continue to encourage this and discuss with him.

## 2021-05-12 LAB — COMPREHENSIVE METABOLIC PANEL
ALT: 11 IU/L (ref 0–44)
AST: 15 IU/L (ref 0–40)
Albumin/Globulin Ratio: 1.7 (ref 1.2–2.2)
Albumin: 4.7 g/dL (ref 3.8–4.8)
Alkaline Phosphatase: 109 IU/L (ref 44–121)
BUN/Creatinine Ratio: 15 (ref 10–24)
BUN: 14 mg/dL (ref 8–27)
Bilirubin Total: 0.6 mg/dL (ref 0.0–1.2)
CO2: 21 mmol/L (ref 20–29)
Calcium: 10 mg/dL (ref 8.6–10.2)
Chloride: 100 mmol/L (ref 96–106)
Creatinine, Ser: 0.91 mg/dL (ref 0.76–1.27)
Globulin, Total: 2.7 g/dL (ref 1.5–4.5)
Glucose: 123 mg/dL — ABNORMAL HIGH (ref 70–99)
Potassium: 4.2 mmol/L (ref 3.5–5.2)
Sodium: 139 mmol/L (ref 134–144)
Total Protein: 7.4 g/dL (ref 6.0–8.5)
eGFR: 93 mL/min/{1.73_m2} (ref >59)

## 2021-05-12 LAB — LIPID PANEL W/O CHOL/HDL RATIO
Cholesterol, Total: 275 mg/dL — ABNORMAL HIGH (ref 100–199)
HDL: 57 mg/dL (ref >39)
LDL Chol Calc (NIH): 202 mg/dL — ABNORMAL HIGH (ref 0–99)
Triglycerides: 96 mg/dL (ref 0–149)
VLDL Cholesterol Cal: 16 mg/dL (ref 5–40)

## 2021-05-12 LAB — VITAMIN B12: Vitamin B-12: 418 pg/mL (ref 232–1245)

## 2021-05-12 NOTE — Progress Notes (Signed)
Contacted via [facility]  -- please call to ensure he receives this message Good afternoon Evan [name], your labs have returned.  Overall they look stable with exception of cholesterol levels.  I am very concerned as these are still elevated.  Are you taking your Atorvastatin and Zetia daily, I am concerned you are not.  Please let me know.  If you are taking these daily then we may consider changing cholesterol medications and doing an injectable one instead of pills.  These need to be lowered to help prevent further stroke.  Please let me know about your medications.   Keep being awesome!!  Thank you for allowing me to participate in your care.  I appreciate you. Kindest regards, Antonia Jicha

## 2021-05-13 ENCOUNTER — Telehealth: Payer: Self-pay

## 2021-05-13 MED ORDER — NITROGLYCERIN 0.4 MG SL SUBL: 0.4000 mg | SUBLINGUAL_TABLET | SUBLINGUAL | 1 refills | Status: DC | PRN

## 2021-05-13 NOTE — Addendum Note (Signed)
Addended by: [name] T on: 05/13/2021 04:18 PM   Modules accepted: Orders

## 2021-05-13 NOTE — Telephone Encounter (Signed)
Patient admits not taking medications due to funds and being out of town.  He says he gets nauseous when he takes his medication.  Patient request a refill on nitro.  Asked patient if he was having to take nitro on a regular basis and he says not just needs an updated rx.  He also complained of acid reflux but denies taking medication for this.  Informed patient of risks of not taking medications.

## 2021-05-13 NOTE — Telephone Encounter (Signed)
Pt would like a call back to discuss labs.  Pt does not have access to a computer.

## 2021-05-13 NOTE — Telephone Encounter (Signed)
Left message for patient to review results via mychart.

## 2021-05-13 NOTE — Addendum Note (Signed)
Addended by: Amado Coe on: 05/13/2021 04:02 PM   Modules accepted: Orders

## 2021-05-13 NOTE — Telephone Encounter (Signed)
-----   Message from [name], NP sent at 05/12/2021  1:57 PM EDT ----- Contacted via MyChart  -- please call to ensure he receives this message Good afternoon [place], your labs have returned.  Overall they look stable with exception of cholesterol levels.  I am very concerned as these are still elevated.  Are you taking your Atorvastatin and Zetia daily, I am concerned you are not.  Please let me know.  If you are taking these daily then we may consider changing cholesterol medications and doing an injectable one instead of pills.  These need to be lowered to help prevent further stroke.  Please let me know about your medications.   Keep being awesome!!  Thank you for allowing me to participate in your care.  I appreciate you. Kindest regards, [name]

## 2021-05-16 ENCOUNTER — Ambulatory Visit (INDEPENDENT_AMBULATORY_CARE_PROVIDER_SITE_OTHER): Payer: Medicare Other | Admitting: Licensed Clinical Social Worker

## 2021-05-16 DIAGNOSIS — E1169 Type 2 diabetes mellitus with other specified complication: Secondary | ICD-10-CM

## 2021-05-16 DIAGNOSIS — I152 Hypertension secondary to endocrine disorders: Secondary | ICD-10-CM

## 2021-05-16 DIAGNOSIS — F339 Major depressive disorder, recurrent, unspecified: Secondary | ICD-10-CM

## 2021-05-16 DIAGNOSIS — F22 Delusional disorders: Secondary | ICD-10-CM

## 2021-05-16 DIAGNOSIS — F419 Anxiety disorder, unspecified: Secondary | ICD-10-CM

## 2021-05-16 NOTE — Chronic Care Management (AMB) (Signed)
Chronic Care Management    Clinical Social Work Note  05/16/2021 Name: Evan [name] MRN: [id number] DOB: February 28, 1956  Evan [name] is a 65 y.o. year old male who is a primary care patient of Evan [name], Evan Faster, NP. The CCM team was consulted to assist the patient with chronic disease management and/or care coordination needs related to: Mental Health Counseling and Resources.   Engaged with patient by telephone for follow up visit in response to provider referral for social work chronic care management and care coordination services.   Consent to Services:  The patient was given information about Chronic Care Management services, agreed to services, and gave verbal consent prior to initiation of services.  Please see initial visit note for detailed documentation.   Patient agreed to services and consent obtained.   Consent to Services:  The patient was given information about Care Management services, agreed to services, and gave verbal consent prior to initiation of services.  Please see initial visit note for detailed documentation.   Patient agreed to services today and consent obtained.  Engaged with patient  by ogibe in response to provider referral for social work care coordination services:  Assessment/Interventions: Assessed patient's current treatment, progress, coping skills, support system and barriers to care.  Patient continues to maintain positive progress with care plan goals. Strategies to assist with management of anxiety symptoms and memory concerns identified See Care Plan below for interventions and patient self-care activities.  Recent life changes or stressors: Fraud attempts, memory concerns, and management of stress  Recommendation: Patient may benefit from, and is in agreement work with LCSW to address care coordination needs and will continue to work with the clinical team to address health care and disease management related needs.   Follow up Plan: Patient  would like continued follow-up from CCM LCSW .  per patient's request will follow up in 06/09/21.  Will call office if needed prior to next encounter.  SDOH (Social Determinants of Health) assessments and interventions performed:  NA  Advanced Directives Status: Not addressed in this encounter.  CCM Care Plan  Allergies  Allergen Reactions   Penicillin G Hives   Sulfa Antibiotics Hives   Tiotropium    Zoloft [Sertraline Hcl] Other (See Comments)    Outpatient Encounter Medications as of 05/16/2021  Medication Sig   acetaminophen (TYLENOL) 500 MG tablet Take 500 mg by mouth every 6 (six) hours as needed.   albuterol (VENTOLIN HFA) 108 (90 Base) MCG/ACT inhaler Inhale 2 puffs into the lungs every 4 (four) hours as needed for wheezing or shortness of breath.   aspirin 81 MG chewable tablet Chew 1 tablet (81 mg total) by mouth daily.   atorvastatin (LIPITOR) 80 MG tablet Take 1 tablet (80 mg total) by mouth daily.   Blood Glucose Monitoring Suppl (ONETOUCH VERIO) w/Device KIT Use to check blood sugar 3 to 4 times a day and document.  Please bring to visits for review.   Cholecalciferol 125 MCG (5000 UT) TABS Take 1 tablet (5,000 Units total) by mouth daily.   Elastic Bandages & Supports (MEDICAL COMPRESSION STOCKINGS) MISC 1 Package by Does not apply route daily.   empagliflozin (JARDIANCE) 10 MG TABS tablet Take 1 tablet (10 mg total) by mouth daily.   ezetimibe (ZETIA) 10 MG tablet Take 1 tablet (10 mg total) by mouth daily.   furosemide (LASIX) 40 MG tablet TAKE 2 TABLETS BY MOUTH ONCE EVERY MORNING AND 1 TABLET ONCE EVERY EVENING   glucose blood (ONETOUCH  VERIO) test strip Use to check blood sugar 3 to 4 times a day.   metoprolol succinate (TOPROL XL) 50 MG 24 hr tablet Take 1 tablet (50 mg total) by mouth daily.   MODERNA COVID-19 VACCINE 100 MCG/0.5ML injection    nitroGLYCERIN (NITROSTAT) 0.4 MG SL tablet Place 1 tablet (0.4 mg total) under the tongue every 5 (five) minutes as  needed for chest pain.   nystatin (MYCOSTATIN/NYSTOP) powder Apply 1 application topically 3 (three) times daily.   olmesartan (BENICAR) 5 MG tablet Take 2 tablets (10 mg total) by mouth daily.   Omega-3 Fatty Acids (OMEGA 3 500 PO) Take 500 mg by mouth daily.   OneTouch Delica Lancets 70J MISC Use to check blood sugar 2-3 times a day.   pantoprazole (PROTONIX) 40 MG tablet Take 1 tablet (40 mg total) by mouth daily.   predniSONE (DELTASONE) 20 MG tablet Take 3 tablets (60 mg total) by mouth daily. Start on 03/04/21   risperiDONE (RISPERDAL) 0.5 MG tablet Take 1 tablet (0.5 mg total) by mouth 2 (two) times daily.   rivaroxaban (XARELTO) 20 MG TABS tablet Take 1 tablet (20 mg total) by mouth at bedtime.   sharps container 1 each by Does not apply route as needed.   Skin Protectants, Misc. (EUCERIN) cream Apply topically as needed for dry skin.   SYMBICORT 80-4.5 MCG/ACT inhaler INHALE 2 PUFFS BY MOUTH TWICE DAILY   triamcinolone cream (KENALOG) 0.1 % APPLY TO AFFECTED AREAS TWICE DAILY   vitamin B-12 (CYANOCOBALAMIN) 1000 MCG tablet Take 1 tablet (1,000 mcg total) by mouth daily.   No facility-administered encounter medications on file as of 05/16/2021.    Patient Active Problem List   Diagnosis Date Noted   Delusional disorder ([place]) 03/03/2021   Other thrombophilia ([place]) 12/25/2020   Vitamin D deficiency 12/04/2020   Vitamin B12 deficiency 12/04/2020   Stenosis of right carotid artery 12/03/2020   History of CVA (cerebrovascular accident) 11/06/2020   History of 2019 novel coronavirus disease (COVID-19) 09/27/2020   Atherosclerosis of aorta ([place]) 12/04/2019   Persistent atrial fibrillation ([place]) 09/12/2019   CAD (coronary artery disease) 09/12/2019   Chronic venous stasis 09/12/2019   Hoarding disorder 09/12/2019   Cervical spinal stenosis 09/12/2019   Diabetic retinopathy ([place]) 09/12/2019   COPD, mild ([place]) 09/12/2019   Osteoporosis 09/09/2019   History of prostate cancer  09/09/2019   Anxiety 08/10/2019   Obstructive sleep apnea on CPAP 08/10/2019   Hyperlipidemia associated with type 2 diabetes mellitus ([place]) 08/10/2019   Chronic diastolic CHF (congestive heart failure) ([place]) 01/09/2014   Esophageal dysmotility 09/12/2013   Type 2 diabetes mellitus with morbid obesity ([place]) 02/14/2012   Obesity, morbid (more than 100 lbs over ideal weight or BMI > 40) ([place]) 07/26/2011   OLD MYOCARDIAL INFARCTION 03/02/2010   Hypertension associated with diabetes ([place]) 03/20/2009   CORONARY ATHEROSCLEROSIS, ARTERY BYPASS GRAFT 03/20/2009    Conditions to be addressed/monitored: HTN, DMII, Anxiety, and Depression; Mental Health Concerns   Care Plan : General Social Work (Adult)  Updates made by Rebekah Chesterfield, LCSW since 05/16/2021 12:00 AM     Problem: Response to Treatment (Depression)      Goal: Response to Treatment Maximized   Start Date: 12/27/2020  This Visit's Progress: On track  Recent Progress: On track  Priority: High  Note:   Current barriers:   Acute Mental Health needs related to Anxiety Housing barriers, Level of care concerns, and Mental Health Concerns  Needs Support, Education, and Care  Coordination in order to meet unmet mental health needs. Clinical Goal(s): Over the next 120 days, patient will work with SW to reduce or manage symptoms of anxiety and increase knowledge and/or ability of: coping skills, healthy habits, self-management skills, and stress reduction.until connected for ongoing counseling.  Clinical Interventions:  Assessed patient's previous treatment, needs, coping skills, current treatment, support system and barriers to care Patient interviewed and appropriate assessments performed Provided mental health counseling with regard to managing mental health conditions Patient reports difficulty managing symptoms of anxiety triggered by chronic health conditions. He endorsing feelings of frustration regarding multiple attempts to scam  patient of services and money Patient is upset about the way he is treated by multiple personal aids, stating that current aid is dishonest about why she does not show to work. CCM LCSW strongly encouraged patient to contact agency about his concerns to reach a resolution 09/08: [name] is back as his guide for 27 hrs a week [place] [phone] 09/26: Patient informed CCM LCSW that [name] will provide transportation for his upcoming PCP appt scheduled 05/11/21. This information was verified by [name], pt's new aid that will be assisting with ADL's in the home on Mondays, Wednesdays, and Fridays  CCM LCSW discussed CBT strategies to assist with managing symptoms  Patient has scheduled transportation assistance through [employer] to run errands for next week. He is proud of this noting that he does not want to continue using aid for transportation needs Validation and encouragement was provided during call. CCM LCSW encouraged patient to continue reflecting on past experiences and current friendships to promote positive mood and stress management. States he has enjoyed playing cards online with friends and praying to cope with stressors. 10/10 Patient shared that recent stress has been alleviated with drawing and talking with family and friends  CCM LCSW provided a supportive environment to allow patient to process his emotions 10/10: Patient reports compliance with medications. Patient utilizes medicine cabinet with labels, AM and PM, to assist with medication management Patient reports that his strong spiritual relationship keeps him encouraged to move forward instead of focusing on past events  Patient reports becoming "stir crazy" from staying at home for majority of the day. CCM LCSW discussed benefits of participating in [facility]. Patient reports hx of attending [facility]. States that he disenrolled because staff and participants believed he was "delusional" because he has close  relationships with various celebrities. Patient is not interested in initiating psychiatry and/or counseling to assist with strengthening support system 09/08: Patient reports that he participates in a group weekly to promote socialization with others CCM LCSW discussed strategies to improve communication skills with aid to assist in preventing misunderstandings or offending others  CCM LCSW reviewed upcoming appointments with patient. He reports that he is utilizing [employer] for transportation. His aid has offered to bring patient to local stores to obtain cleaning supplies. Patient reports that he is aware that he can also utilize [employer] for transportation to medical appointments 10/10: Patient has a ride scheduled for 10/18 to take him grocery shopping CCM LCSW discussed strategies to assist patient with memory concerns. Patient agreed to write notes and post them in high traffic areas of the home to remind him of upcoming appts and/or medications CCM LCSW collaborated with PCP and CCM RN regarding concerns of medication adherence. LCSW informed RNCM of patient's request for a f/up call regarding questions about his cholesterol levels and strategies to manage condition Other interventions: Solution-Focused Strategies, Mindfulness or  Relaxation Training, Active listening / Reflection utilized , Emotional Supportive Provided, Psychoeducation for mental health needs , Motivational Interviewing, Participation in counseling encouraged , Participation in support group encouraged , Consideration of in-home help encouraged , and Verbalization of feelings encouraged   Discussed plans with patient for ongoing care management follow up and provided patient with direct contact information for care management team Collaboration with PCP regarding development and update of comprehensive plan of care as evidenced by provider attestation and co-signature Inter-disciplinary care team collaboration (see longitudinal  plan of care) Patient Goals/Self-Care Activities: Over the next 120 days Attend all scheduled appointments with providers Contact office with any questions or concerns [facility] transportation number to assist with attending medical appointments Practice relaxation or meditation daily Practice positive thinking and self-talk     [name], MSW, [email or web address] Phone [phone] 11:27 AM

## 2021-05-16 NOTE — Patient Instructions (Signed)
Visit Information   Goals Addressed               This Visit's Progress     Patient Stated     SW-"I need more help." (pt-stated)   On track     Patient Goals/Self-Care Activities: Over the next 120 days Attend all scheduled appointments with providers Contact office with any questions or concerns [facility] transportation number to assist with attending medical appointments Practice relaxation or meditation daily Practice positive thinking and self-talk      Other     SW-Track and Manage My Symptoms-Depression   On track     Timeframe:  Long-Range Goal Priority:  High Start Date:   08/09/20                         Expected End Date: 08/06/21                    Follow Up Date- 06/09/21  Patient Goals/Self-Care Activities: Over the next 120 days Attend all scheduled appointments with providers Contact office with any questions or concerns Utilize [facility] transportation number to assist with attending medical appointments Practice relaxation or meditation daily Practice positive thinking and self-talk        Patient verbalizes understanding of instructions provided today.   Telephone follow up appointment with care management team member scheduled for:06/09/21  [name], MSW, [email or web address] Phone [phone] 11:30 AM

## 2021-05-17 ENCOUNTER — Telehealth: Payer: Self-pay

## 2021-05-17 NOTE — Telephone Encounter (Signed)
  Care Management   Follow Up Note   05/17/2021 Name: Evan [name] MRN: [id number] DOB: 20-Jan-1956   Referred by: [name], NP Reason for referral : Chronic Care Management (RNCM: Follow up with the patient on Cholesterol information and other Chronic Conditions. )   An unsuccessful telephone outreach was attempted today. The patient was referred to the case management team for assistance with care management and care coordination.   Follow Up Plan: A HIPPA compliant phone message was left for the patient providing contact information and requesting a return call.   Noreene Larsson RN, MSN, [facility] Mobile: [phone]

## 2021-05-19 ENCOUNTER — Ambulatory Visit: Payer: Self-pay

## 2021-05-19 DIAGNOSIS — E1169 Type 2 diabetes mellitus with other specified complication: Secondary | ICD-10-CM

## 2021-05-19 DIAGNOSIS — I25118 Atherosclerotic heart disease of native coronary artery with other forms of angina pectoris: Secondary | ICD-10-CM

## 2021-05-19 DIAGNOSIS — E785 Hyperlipidemia, unspecified: Secondary | ICD-10-CM

## 2021-05-19 DIAGNOSIS — F419 Anxiety disorder, unspecified: Secondary | ICD-10-CM

## 2021-05-19 DIAGNOSIS — Z8673 Personal history of transient ischemic attack (TIA), and cerebral infarction without residual deficits: Secondary | ICD-10-CM

## 2021-05-19 DIAGNOSIS — F339 Major depressive disorder, recurrent, unspecified: Secondary | ICD-10-CM

## 2021-05-19 NOTE — Patient Instructions (Signed)
Visit Information  PATIENT GOALS:  Goals Addressed             This Visit's Progress    RNCM: Keep or Improve My Strength-Stroke       Timeframe:  Short-Term Goal Priority:  High Start Date:     05-03-2021                        Expected End Date:    05-03-2022                   Follow Up Date: 07-12-2021   - eat healthy to increase strength - increase activity or exercise time a little every week - know who to call for help if I fall    Why is this important?   Before the stroke you probably did not think much about being safe when you are up and about.  Now, it may be harder for you to get around.  It may also be easier for you to trip or fall.  It is common to have muscle weakness after a stroke. You may also feel like you cannot control an arm or leg.  It will be helpful to work with a physical therapist to get your strength and muscle control back.  It is good to stay as active as you can. Walking and stretching help you stay strong and flexible.  The physical therapist will develop an exercise program just for you.     05-03-2021: the patient has a lot of issues with memory loss and repeating stories. Also has a history of delusional thoughts. He easily gets off of track and has to be redirected during the call several time. Education on writing things down to discuss with the provider at his upcoming visit. Will continue to monitor for changes. Currently working with the LCSW for expressed needs as well. 05-19-2021: Extensive education today about effectively managing his cholesterol. Reached out sooner than regular appointment due to expressed need of the patient and at the request of the LCSW. Education provided to the patient by mail this week, verbal education, and redirection throughout the outreach call. Will continue to monitor for changes or needs.      RNCM: Make and Keep All Appointments       Timeframe:  Long-Range Goal Priority:  High Start Date:     10-15-2020                         Expected End Date:        01-04-2022               Follow Up Date 07-12-2021   - arrange a ride through an agency 1 week before appointment - ask family or friend for a ride - call to cancel if needed - keep a calendar with prescription refill dates - keep a calendar with appointment dates - learn the bus route - use public transportation    Why is this important?   Part of staying healthy is seeing the doctor for follow-up care.  If you forget your appointments, there are some things you can do to stay on track.    Notes: Has a colonoscopy for 10-22-2020 and podiatry appointment 10-26-2020. 01-04-2021: The patient reminded of his appointment with pcp on 01-07-2021 at 11 am.  The patient states he has transportation. 03-01-2021: The patient has been to the ER 2 times in July due  to palpitations. States its because of his anxiety and stress he is under at his place he lives. Discussed keeping appointments and following recommendations from his providers. 05-03-2021: The patient has an appointment on 05-11-2021 at 11 am. Reminded the patient of the appointment. Is unsure of transportation. The patient has caregivers that come in but states they are unreliable. He states he will walk if he has to do so.  Reviewed with the patient the transportation [facility] provides. 06-19-2021: Saw the pcp on 05-11-2021. The patient was able to repeat upcoming appointments with the LCSW in November, [facility] in December and pcp in January. Education and support given.         Patient verbalizes understanding of instructions provided today and agrees to view in [place].   Telephone follow up appointment with care management team member scheduled for: 07-12-2021 at 0900 am  Noreene Larsson RN, MSN, [facility] Mobile: [phone]  Carotid Artery Disease Carotid artery disease is the narrowing or blockage of one or both carotid  arteries. This condition is also called carotid artery stenosis. The carotid arteries are the two main blood vessels on either side of the neck. They send blood to the brain, other parts of the head, and the neck.  This condition increases your risk for a stroke or a transient ischemic attack (TIA). A TIA is a "mini-stroke" that causes stroke-like symptoms that go away quickly. What are the causes? This condition is mainly caused by a narrowing and hardening of the carotid arteries. The carotid arteries can become narrow or clogged with a buildup of plaque. Plaque includes: Fat. Cholesterol. Calcium. Other substances. What increases the risk? The following factors may make you more likely to develop this condition: Having certain medical conditions, such as: High cholesterol. High blood pressure. Diabetes. Obesity. Smoking. A family history of cardiovascular disease. Not being active or lack of regular exercise. Being male. Men have a higher risk of having arteries become narrow and harden earlier in life than women. Old age. What are the signs or symptoms? This condition may not have any signs or symptoms until a stroke or TIA happens. In some cases, your doctor may be able to hear a whooshing sound. This can suggest a change in blood flow caused by plaque buildup. An eye exam can also help find signs of the condition. How is this treated? This condition may be treated with more than one treatment. Treatment options include: Lifestyle changes, such as: Quitting smoking. Getting regular exercise, or getting exercise as told by your doctor. Eating a healthy diet. Managing stress. Keeping a healthy weight. Medicines to control: Blood pressure. Cholesterol. Blood clotting. Surgery. You may have: A surgery to remove the blockages in the carotid arteries. A procedure in which a small mesh tube (stent) is used to widen the blocked carotid arteries. Follow these instructions at  home: Eating and drinking Follow instructions about your diet from your doctor. It is important to follow a healthy diet. Eat a diet that includes: A lot of fresh fruits and vegetables. Low-fat (lean) meats. Avoid these foods: Foods that are high in fat. Foods that are high in salt (sodium). Foods that are fried. Foods that are processed. Foods that have few good nutrients (poor nutritional value).  Lifestyle  Keep a healthy weight. Do exercises as told by your doctor to stay active. Each week, you should get one of the following: At least 150 minutes of exercise  that raises your heart rate and makes you sweat (moderate-intensity exercise). At least 75 minutes of exercise that takes a lot of effort. Do not use any products that contain nicotine or tobacco, such as cigarettes, e-cigarettes, and chewing tobacco. If you need help quitting, ask your doctor. Do not drink alcohol if: Your doctor tells you not to drink. You are pregnant, may be pregnant, or are planning to become pregnant. If you drink alcohol: Limit how much you use to: 0-1 drink a day for women. 0-2 drinks a day for men. Be aware of how much alcohol is in your drink. In the [place] one drink equals one 12 oz bottle of beer (355 mL), one 5 oz glass of wine (148 mL), or one 1 oz glass of hard liquor (44 mL). Do not use drugs. Manage your stress. Ask your doctor for tips on how to do this. General instructions Take over-the-counter and prescription medicines only as told by your doctor. Keep all follow-up visits as told by your doctor. This is important. Where to find more information American Heart Association: www.heart.org Get help right away if: You have any signs of a stroke. "BE FAST" is an easy way to remember the main warning signs: B - Balance. Signs are dizziness, sudden trouble walking, or loss of balance. E - Eyes. Signs are trouble seeing or a change in how you see. F - Face. Signs are sudden weakness or  loss of feeling of the face, or the face or eyelid drooping on one side. A - Arms. Signs are weakness or loss of feeling in an arm. This happens suddenly and usually on one side of the body. S - Speech. Signs are sudden trouble speaking, slurred speech, or trouble understanding what people say. T - Time. Time to call emergency services. Write down what time symptoms started. You have other signs of a stroke, such as: A sudden, very bad headache with no known cause. Feeling like you may vomit (nausea). Vomiting. A seizure. These symptoms may be an emergency. Do not wait to see if the symptoms will go away. Get medical help right away. Call your local emergency services (911 in the [place] Do not drive yourself to the hospital. Summary The carotid arteries are blood vessels on both sides of the neck. If these arteries get smaller or get blocked, you are more likely to have a stroke or a mini-stroke. This condition can be treated with lifestyle changes, medicines, surgery, or a blend of these treatments. Get help right away if you have any signs of a stroke. "BE FAST" is an easy way to remember the main warning signs of stroke. This information is not intended to replace advice given to you by your health care provider. Make sure you discuss any questions you have with your health care provider. Document Revised: 02/17/2019 Document Reviewed: 02/17/2019 Elsevier Patient Education  [place]. High Cholesterol High cholesterol is a condition in which the blood has high levels of a white, waxy substance similar to fat (cholesterol). The liver makes all the cholesterol that the body needs. The human body needs small amounts of cholesterol to help build cells. A person gets extra or excess cholesterol from the food that he or she eats. The blood carries cholesterol from the liver to the rest of the body. If you have high cholesterol, deposits (plaques) may build up on the walls of your arteries.  Arteries are the blood vessels that carry blood away from your heart. These  plaques make the arteries narrow and stiff. Cholesterol plaques increase your risk for heart attack and stroke. Work with your health care provider to keep your cholesterol levels in a healthy range. What increases the risk? The following factors may make you more likely to develop this condition: Eating foods that are high in animal fat (saturated fat) or cholesterol. Being overweight. Not getting enough exercise. A family history of high cholesterol (familial hypercholesterolemia). Use of tobacco products. Having diabetes. What are the signs or symptoms? In most cases, high cholesterol does not usually cause any symptoms. In severe cases, very high cholesterol levels can cause: Fatty bumps under the skin (xanthomas). A white or gray ring around the black center (pupil) of the eye. How is this diagnosed? This condition may be diagnosed based on the results of a blood test. If you are older than 66 years of age, your health care provider may check your cholesterol levels every 4-6 years. You may be checked more often if you have high cholesterol or other risk factors for heart disease. The blood test for cholesterol measures: "Bad" cholesterol, or LDL cholesterol. This is the main type of cholesterol that causes heart disease. The desired level is less than 100 mg/dL (2.59 mmol/L). "Good" cholesterol, or HDL cholesterol. HDL helps protect against heart disease by cleaning the arteries and carrying the LDL to the liver for processing. The desired level for HDL is 60 mg/dL (1.55 mmol/L) or higher. Triglycerides. These are fats that your body can store or burn for energy. The desired level is less than 150 mg/dL (1.69 mmol/L). Total cholesterol. This measures the total amount of cholesterol in your blood and includes LDL, HDL, and triglycerides. The desired level is less than 200 mg/dL (5.17 mmol/L). How is this  treated? Treatment for high cholesterol starts with lifestyle changes, such as diet and exercise. Diet changes. You may be asked to eat foods that have more fiber and less saturated fats or added sugar. Lifestyle changes. These may include regular exercise, maintaining a healthy weight, and quitting use of tobacco products. Medicines. These are given when diet and lifestyle changes have not worked. You may be prescribed a statin medicine to help lower your cholesterol levels. Follow these instructions at home: Eating and drinking  Eat a healthy, balanced diet. This diet includes: Daily servings of a variety of fresh, frozen, or canned fruits and vegetables. Daily servings of whole grain foods that are rich in fiber. Foods that are low in saturated fats and trans fats. These include poultry and fish without skin, lean cuts of meat, and low-fat dairy products. A variety of fish, especially oily fish that contain omega-3 fatty acids. Aim to eat fish at least 2 times a week. Avoid foods and drinks that have added sugar. Use healthy cooking methods, such as roasting, grilling, broiling, baking, poaching, steaming, and stir-frying. Do not fry your food except for stir-frying. If you drink alcohol: Limit how much you have to: 0-1 drink a day for women who are not pregnant. 0-2 drinks a day for men. Know how much alcohol is in a drink. In the [place] one drink equals one 12 oz bottle of beer (355 mL), one 5 oz glass of wine (148 mL), or one 1 oz glass of hard liquor (44 mL). Lifestyle  Get regular exercise. Aim to exercise for a total of 150 minutes a week. Increase your activity level by doing activities such as gardening, walking, and taking the stairs. Do not use any products  that contain nicotine or tobacco. These products include cigarettes, chewing tobacco, and vaping devices, such as e-cigarettes. If you need help quitting, ask your health care provider. General instructions Take  over-the-counter and prescription medicines only as told by your health care provider. Keep all follow-up visits. This is important. Where to find more information American Heart Association: www.heart.org National Heart, Lung, and Blood Institute: https://wilson-eaton.com/ Contact a health care provider if: You have trouble achieving or maintaining a healthy diet or weight. You are starting an exercise program. You are unable to stop smoking. Get help right away if: You have chest pain. You have trouble breathing. You have discomfort or pain in your jaw, neck, back, shoulder, or arm. You have any symptoms of a stroke. "BE FAST" is an easy way to remember the main warning signs of a stroke: B - Balance. Signs are dizziness, sudden trouble walking, or loss of balance. E - Eyes. Signs are trouble seeing or a sudden change in vision. F - Face. Signs are sudden weakness or numbness of the face, or the face or eyelid drooping on one side. A - Arms. Signs are weakness or numbness in an arm. This happens suddenly and usually on one side of the body. S - Speech. Signs are sudden trouble speaking, slurred speech, or trouble understanding what people say. T - Time. Time to call emergency services. Write down what time symptoms started. You have other signs of a stroke, such as: A sudden, severe headache with no known cause. Nausea or vomiting. Seizure. These symptoms may represent a serious problem that is an emergency. Do not wait to see if the symptoms will go away. Get medical help right away. Call your local emergency services (911 in the [place] Do not drive yourself to the hospital. Summary Cholesterol plaques increase your risk for heart attack and stroke. Work with your health care provider to keep your cholesterol levels in a healthy range. Eat a healthy, balanced diet, get regular exercise, and maintain a healthy weight. Do not use any products that contain nicotine or tobacco. These products  include cigarettes, chewing tobacco, and vaping devices, such as e-cigarettes. Get help right away if you have any symptoms of a stroke. This information is not intended to replace advice given to you by your health care provider. Make sure you discuss any questions you have with your health care provider. Document Revised: 10/07/2020 Document Reviewed: 09/27/2020 Elsevier Patient Education  [place]. Cholesterol Content in Foods Cholesterol is a waxy, fat-like substance that helps to carry fat in the blood. The body needs cholesterol in small amounts, but too much cholesterol can cause damage to the arteries and heart. Most people should eat less than 200 milligrams (mg) of cholesterol a day. Foods with cholesterol Cholesterol is found in animal-based foods, such as meat, seafood, and dairy. Generally, low-fat dairy and lean meats have less cholesterol than full-fat dairy and fatty meats. The milligrams of cholesterol per serving (mg per serving) of common cholesterol-containing foods are listed below. Meat and other proteins Egg -- one large whole egg has 186 mg. Veal shank -- 4 oz has 141 mg. Lean ground [place] (93% lean) -- 4 oz has 118 mg. Fat-trimmed lamb loin -- 4 oz has 106 mg. Lean ground beef (90% lean) -- 4 oz has 100 mg. Lobster -- 3.5 oz has 90 mg. Pork loin chops -- 4 oz has 86 mg. Canned salmon -- 3.5 oz has 83 mg. Fat-trimmed beef top loin -- 4  oz has 78 mg. Frankfurter -- 1 frank (3.5 oz) has 77 mg. Crab -- 3.5 oz has 71 mg. Roasted chicken without skin, white meat -- 4 oz has 66 mg. Light bologna -- 2 oz has 45 mg. Deli-cut [place] -- 2 oz has 31 mg. Canned tuna -- 3.5 oz has 31 mg. Berniece Salines -- 1 oz has 29 mg. Oysters and mussels (raw) -- 3.5 oz has 25 mg. Mackerel -- 1 oz has 22 mg. Trout -- 1 oz has 20 mg. Pork sausage -- 1 link (1 oz) has 17 mg. Salmon -- 1 oz has 16 mg. Tilapia -- 1 oz has 14 mg. Dairy Soft-serve ice cream --  cup (4 oz) has 103  mg. Whole-milk yogurt -- 1 cup (8 oz) has 29 mg. Cheddar cheese -- 1 oz has 28 mg. American cheese -- 1 oz has 28 mg. Whole milk -- 1 cup (8 oz) has 23 mg. 2% milk -- 1 cup (8 oz) has 18 mg. Cream cheese -- 1 tablespoon (Tbsp) has 15 mg. Cottage cheese --  cup (4 oz) has 14 mg. Low-fat (1%) milk -- 1 cup (8 oz) has 10 mg. Sour cream -- 1 Tbsp has 8.5 mg. Low-fat yogurt -- 1 cup (8 oz) has 8 mg. Nonfat Greek yogurt -- 1 cup (8 oz) has 7 mg. Half-and-half cream -- 1 Tbsp has 5 mg. Fats and oils Cod liver oil -- 1 tablespoon (Tbsp) has 82 mg. Butter -- 1 Tbsp has 15 mg. Lard -- 1 Tbsp has 14 mg. Bacon grease -- 1 Tbsp has 14 mg. Mayonnaise -- 1 Tbsp has 5-10 mg. Margarine -- 1 Tbsp has 3-10 mg. Exact amounts of cholesterol in these foods may vary depending on specific ingredients and brands. Foods without cholesterol Most plant-based foods do not have cholesterol unless you combine them with a food that has cholesterol. Foods without cholesterol include: Grains and cereals. Vegetables. Fruits. Vegetable oils, such as olive, canola, and sunflower oil. Legumes, such as peas, beans, and lentils. Nuts and seeds. Egg whites. Summary The body needs cholesterol in small amounts, but too much cholesterol can cause damage to the arteries and heart. Most people should eat less than 200 milligrams (mg) of cholesterol a day. This information is not intended to replace advice given to you by your health care provider. Make sure you discuss any questions you have with your health care provider. Document Revised: 11/04/2019 Document Reviewed: 12/15/2019 Elsevier Patient Education  [place].
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
## 2021-05-20 ENCOUNTER — Telehealth: Payer: Self-pay

## 2021-05-20 NOTE — Progress Notes (Signed)
Hello ladies,  Spoke with Mortimer Fries and he declines scheduling with Pharm D   Thank you Noreene Larsson, [address], [place], [place] [address] Direct Dial: [phone] [email or web address] Website: [email or web address]

## 2021-05-20 NOTE — Chronic Care Management (AMB) (Signed)
  Chronic Care Management   Note  05/20/2021 Name: Evan [name] MRN: [id number] DOB: 08/31/1955  Evan [name] is a 65 y.o. year old male who is a primary care patient of [name], [name], NP. Evan [name] is currently enrolled in care management services. An additional referral for Pharm D  was placed.   Follow up plan: Patient declines engagement by the Pharm D care management team. Appropriate care team members and provider have been notified via electronic communication.   Noreene Larsson, [address], [place], [place] [address] Direct Dial: [phone] [email or web address] Website: [email or web address]

## 2021-05-26 NOTE — Telephone Encounter (Signed)
Copied from [place] [id number]. Topic: General - Other >> May 26, 2021 10:17 AM Leward Quan A wrote: Reason for CRM: Patient called in to inform [name] that he did get his medication and complained that he still having issues with transportation please advise. Ph#  [phone]

## 2021-05-31 ENCOUNTER — Telehealth: Payer: Self-pay | Admitting: Nurse Practitioner

## 2021-05-31 ENCOUNTER — Other Ambulatory Visit: Payer: Self-pay

## 2021-05-31 ENCOUNTER — Emergency Department
Admission: EM | Admit: 2021-05-31 | Discharge: 2021-05-31 | Disposition: A | Discharge status: Home / Self Care | Payer: Medicare Other | Attending: Student in an Organized Health Care Education/Training Program | Admitting: Student in an Organized Health Care Education/Training Program

## 2021-05-31 ENCOUNTER — Emergency Department: Payer: Medicare Other

## 2021-05-31 DIAGNOSIS — R5381 Other malaise: Secondary | ICD-10-CM | POA: Diagnosis not present

## 2021-05-31 DIAGNOSIS — Y900 Blood alcohol level of less than 20 mg/100 ml: Secondary | ICD-10-CM | POA: Diagnosis not present

## 2021-05-31 DIAGNOSIS — Z8616 Personal history of COVID-19: Secondary | ICD-10-CM | POA: Diagnosis not present

## 2021-05-31 DIAGNOSIS — E119 Type 2 diabetes mellitus without complications: Secondary | ICD-10-CM | POA: Insufficient documentation

## 2021-05-31 DIAGNOSIS — Z7901 Long term (current) use of anticoagulants: Secondary | ICD-10-CM | POA: Diagnosis not present

## 2021-05-31 DIAGNOSIS — R0789 Other chest pain: Secondary | ICD-10-CM | POA: Diagnosis not present

## 2021-05-31 DIAGNOSIS — I5032 Chronic diastolic (congestive) heart failure: Secondary | ICD-10-CM | POA: Insufficient documentation

## 2021-05-31 DIAGNOSIS — J45909 Unspecified asthma, uncomplicated: Secondary | ICD-10-CM | POA: Insufficient documentation

## 2021-05-31 DIAGNOSIS — Z7982 Long term (current) use of aspirin: Secondary | ICD-10-CM | POA: Diagnosis not present

## 2021-05-31 DIAGNOSIS — I2510 Atherosclerotic heart disease of native coronary artery without angina pectoris: Secondary | ICD-10-CM | POA: Diagnosis not present

## 2021-05-31 DIAGNOSIS — J449 Chronic obstructive pulmonary disease, unspecified: Secondary | ICD-10-CM | POA: Insufficient documentation

## 2021-05-31 DIAGNOSIS — I110 Hypertensive heart disease with heart failure: Secondary | ICD-10-CM | POA: Diagnosis not present

## 2021-05-31 DIAGNOSIS — R6889 Other general symptoms and signs: Secondary | ICD-10-CM | POA: Diagnosis not present

## 2021-05-31 DIAGNOSIS — Z743 Need for continuous supervision: Secondary | ICD-10-CM | POA: Diagnosis not present

## 2021-05-31 DIAGNOSIS — Z7951 Long term (current) use of inhaled steroids: Secondary | ICD-10-CM | POA: Insufficient documentation

## 2021-05-31 DIAGNOSIS — Z951 Presence of aortocoronary bypass graft: Secondary | ICD-10-CM | POA: Diagnosis not present

## 2021-05-31 DIAGNOSIS — Z7984 Long term (current) use of oral hypoglycemic drugs: Secondary | ICD-10-CM | POA: Diagnosis not present

## 2021-05-31 DIAGNOSIS — L03115 Cellulitis of right lower limb: Secondary | ICD-10-CM | POA: Insufficient documentation

## 2021-05-31 DIAGNOSIS — Z79899 Other long term (current) drug therapy: Secondary | ICD-10-CM | POA: Insufficient documentation

## 2021-05-31 LAB — CBC
HCT: 47.1 % (ref 39.0–52.0)
Hemoglobin: 15.8 g/dL (ref 13.0–17.0)
MCH: 29.9 pg (ref 26.0–34.0)
MCHC: 33.5 g/dL (ref 30.0–36.0)
MCV: 89 fL (ref 80.0–100.0)
Platelets: 249 10*3/uL (ref 150–400)
RBC: 5.29 MIL/uL (ref 4.22–5.81)
RDW: 14.9 % (ref 11.5–15.5)
WBC: 11.7 10*3/uL — ABNORMAL HIGH (ref 4.0–10.5)
nRBC: 0 % (ref 0.0–0.2)

## 2021-05-31 LAB — COMPREHENSIVE METABOLIC PANEL
ALT: 20 U/L (ref 0–44)
AST: 21 U/L (ref 15–41)
Albumin: 4.2 g/dL (ref 3.5–5.0)
Alkaline Phosphatase: 81 U/L (ref 38–126)
Anion gap: 7 (ref 5–15)
BUN: 20 mg/dL (ref 8–23)
CO2: 24 mmol/L (ref 22–32)
Calcium: 9.2 mg/dL (ref 8.9–10.3)
Chloride: 108 mmol/L (ref 98–111)
Creatinine, Ser: 0.84 mg/dL (ref 0.61–1.24)
GFR, Estimated: >60 mL/min (ref >60)
Glucose, Bld: 126 mg/dL — ABNORMAL HIGH (ref 70–99)
Potassium: 4.4 mmol/L (ref 3.5–5.1)
Sodium: 139 mmol/L (ref 135–145)
Total Bilirubin: 1.1 mg/dL (ref 0.3–1.2)
Total Protein: 7.9 g/dL (ref 6.5–8.1)

## 2021-05-31 LAB — TROPONIN I (HIGH SENSITIVITY)
Troponin I (High Sensitivity): 17 ng/L (ref <18)
Troponin I (High Sensitivity): 16 ng/L (ref <18)

## 2021-05-31 LAB — SALICYLATE LEVEL: Salicylate Lvl: <7 mg/dL — ABNORMAL LOW (ref 7.0–30.0)

## 2021-05-31 LAB — ETHANOL: Alcohol, Ethyl (B): <10 mg/dL (ref <10)

## 2021-05-31 LAB — ACETAMINOPHEN LEVEL: Acetaminophen (Tylenol), Serum: <10 ug/mL — ABNORMAL LOW (ref 10–30)

## 2021-05-31 MED ORDER — CLINDAMYCIN HCL 300 MG PO CAPS: 300.0000 mg | ORAL_CAPSULE | Freq: Three times a day (TID) | ORAL | 0 refills | Status: AC

## 2021-05-31 MED ORDER — PROBIOTIC 250 MG PO CAPS: 1.0000 | ORAL_CAPSULE | Freq: Two times a day (BID) | ORAL | 0 refills | Status: DC

## 2021-05-31 NOTE — Telephone Encounter (Signed)
Home Health Verbal Orders - Caller/Agency: [employer] called for Evan [name]/patient is requesting [facility]/fx [phone]/. Evan [name] doent like the previus one Evan [name] has now. Callback Number: [phone] Requesting Skilled Nursing Frequency: N/A

## 2021-05-31 NOTE — Telephone Encounter (Signed)
Copied from [place] [id number]. Topic: General - Other >> May 31, 2021  8:17 AM Valere Dross wrote: Reason for CRM: Pt called in requesting to speak with Pam or Delana Meyer, about getting a new home nurse, due to feeling like she is not doing what she is supposed to. Please advise. >> May 31, 2021  8:20 AM Holley Dexter N wrote: Pt also wanted to state he is not upset at all, just had some concerns, please advise.

## 2021-05-31 NOTE — ED Triage Notes (Signed)
Pt comes into the ED via EMS from home, states he has been "seeing floaters in his vision the size of shrimp" having visual hallucinations, auditory hallucinations. Denies SI/HI  [id number] 146/88 90HR

## 2021-05-31 NOTE — ED Provider Notes (Signed)
Emergency Medicine Provider Triage Evaluation Note  Evan [name] , a 65 y.o. male  was evaluated in triage.  Pt complains of "large insects" in his vision for the past 24 hours, denies any associated eye pain or blurry vision.  He denies any auditory hallucinations, suicidal ideation, or homicidal ideation.  Review of Systems  Positive: Visual hallucinations. Negative: Auditory hallucinations, eye pain, suicidal ideation, homicidal ideation.  Physical Exam  There were no vitals taken for this visit. Gen:   Awake, no distress  Resp:  Normal effort, clear to auscultation bilaterally. MSK:   Moves extremities without difficulty Other:  Pupils equal, round, and reactive to light bilaterally.  Extraocular movements intact.  Medical Decision Making  Medically screening exam initiated at 2:43 PM.  Appropriate orders placed.  Evan [name] was informed that the remainder of the evaluation will be completed by another provider, this initial triage assessment does not replace that evaluation, and the importance of remaining in the ED until their evaluation is complete.  65 year old male presents to the ED complaining of 24 hours of seeing "large insects" crawling on the wall, denies auditory hallucinations or other psychiatric complaint.  We will screen basic labs for now.   Blake Divine, MD 05/31/21 [phone]

## 2021-05-31 NOTE — ED Provider Notes (Signed)
[facility] Emergency Department Provider Note    Event Date/Time   First MD Initiated Contact with Patient 05/31/21 1508     (approximate)  I have reviewed the triage vital signs and the nursing notes.   HISTORY  Chief Complaint Psychiatric Evaluation    HPI Evan [name] is a 65 y.o. male below listed past medical history presents to the ER for evaluation of multiple complaints but is describing some chest pressure this been going on since this morning.  States he is worried because his blood pressure was elevated however he felt like he might be in A. fib and is worried that A. fib will kill him.  Reportedly in triage said something about seeing insects in the wall but he says that that was actually a pen on the desk and that he was having some dizziness to the time making it move back-and-forth to look like an insect.  Patient denies any AV or auditory hallucinations no SI or HI.  Denies any nausea or vomiting.  No pain at this time.  Past Medical History:  Diagnosis Date   Anginal pain ([place])    Anxiety disorder    Asthma    Atresia of esophagus without fistula    CAD (coronary artery disease)    Cellulitis    CHF (congestive heart failure) (HCC)    NYHA CLASS III,CHRONIC,DIASTOLIC   COPD (chronic obstructive pulmonary disease) ([place])    COVID-19    Diabetes mellitus without complication (HCC)    Edema    RIGHT LOWER LEG   Gastroesophageal reflux    H/O: GI bleed    History of pneumonia    Remote   History of scarlet fever    Childhood   Hyperlipidemia    Hypertension    Myocardial infarction ([place]) 2009   Obesity    Obstructive sleep apnea    Pain    CHRONIC BACK / ABDOMINAL   Panic disorder    Peripheral venous insufficiency    PTSD (post-traumatic stress disorder)    Retinopathy    DIABETIC   Stasis, venous    Stroke ([place])    Vertigo    Family History  Problem Relation Age of Onset   Heart attack Mother    Hypertension  Mother    Hyperlipidemia Mother    Heart attack Brother 90       MI   Coronary artery disease Other    Past Surgical History:  Procedure Laterality Date   CARDIAC CATHETERIZATION     CATARACT EXTRACTION Left    CATARACT EXTRACTION W/PHACO Right 05/03/2017   Procedure: CATARACT EXTRACTION PHACO AND INTRAOCULAR LENS PLACEMENT ([place]);  Surgeon: Leandrew Koyanagi, MD;  Location: [facility] ORS;  Service: Ophthalmology;  Laterality: Right;  [place] 00:35.3 AP% 12.3 CDE 4.33 Fluid Pack lot # [id number]        COLONOSCOPY WITH PROPOFOL N/A 11/26/2020   Procedure: COLONOSCOPY WITH PROPOFOL;  Surgeon: Jonathon Bellows, MD;  Location: [facility] ENDOSCOPY;  Service: Gastroenterology;  Laterality: N/A;  ANNETTE TO PICK UP [phone]   CORONARY ANGIOPLASTY WITH STENT PLACEMENT  2002   CORONARY ANGIOPLASTY WITH STENT PLACEMENT  1999   CORONARY ARTERY BYPASS GRAFT     x7   ESOPHAGOGASTRODUODENOSCOPY N/A 09/19/2016   Procedure: ESOPHAGOGASTRODUODENOSCOPY (EGD);  Surgeon: Lollie Sails, MD;  Location[facility] ENDOSCOPY;  Service: Endoscopy;  Laterality: N/A;   ESOPHAGOGASTRODUODENOSCOPY (EGD) WITH PROPOFOL  11/26/2020   Procedure: ESOPHAGOGASTRODUODENOSCOPY (EGD) WITH PROPOFOL;  Surgeon: Jonathon Bellows, MD;  Location: [facility] ENDOSCOPY;  Service: Gastroenterology;;   Patient Active Problem List   Diagnosis Date Noted   Delusional disorder ([place]) 03/03/2021   Other thrombophilia ([place]) 12/25/2020   Vitamin D deficiency 12/04/2020   Vitamin B12 deficiency 12/04/2020   Stenosis of right carotid artery 12/03/2020   History of CVA (cerebrovascular accident) 11/06/2020   History of 2019 novel coronavirus disease (COVID-19) 09/27/2020   Atherosclerosis of aorta ([place]) 12/04/2019   Persistent atrial fibrillation ([place]) 09/12/2019   CAD (coronary artery disease) 09/12/2019   Chronic venous stasis 09/12/2019   Hoarding disorder 09/12/2019   Cervical spinal stenosis 09/12/2019   Diabetic retinopathy ([place]) 09/12/2019   COPD, mild  ([place]) 09/12/2019   Osteoporosis 09/09/2019   History of prostate cancer 09/09/2019   Anxiety 08/10/2019   Obstructive sleep apnea on CPAP 08/10/2019   Hyperlipidemia associated with type 2 diabetes mellitus ([place]) 08/10/2019   Chronic diastolic CHF (congestive heart failure) ([place]) 01/09/2014   Esophageal dysmotility 09/12/2013   Type 2 diabetes mellitus with morbid obesity ([place]) 02/14/2012   Obesity, morbid (more than 100 lbs over ideal weight or BMI > 40) ([place]) 07/26/2011   OLD MYOCARDIAL INFARCTION 03/02/2010   Hypertension associated with diabetes ([place]) 03/20/2009   CORONARY ATHEROSCLEROSIS, ARTERY BYPASS GRAFT 03/20/2009      Prior to Admission medications   Medication Sig Start Date End Date Taking? Authorizing Provider  clindamycin (CLEOCIN) 300 MG capsule Take 1 capsule (300 mg total) by mouth 3 (three) times daily for 7 days. 05/31/21 06/07/21 Yes Merlyn Lot, MD  Saccharomyces boulardii (PROBIOTIC) 250 MG CAPS Take 1 capsule by mouth in the morning and at bedtime. 05/31/21  Yes Merlyn Lot, MD  acetaminophen (TYLENOL) 500 MG tablet Take 500 mg by mouth every 6 (six) hours as needed.    [provider]  albuterol (VENTOLIN HFA) 108 (90 Base) MCG/ACT inhaler Inhale 2 puffs into the lungs every 4 (four) hours as needed for wheezing or shortness of breath. 03/03/21   Ward, Delice Bison, DO  aspirin 81 MG chewable tablet Chew 1 tablet (81 mg total) by mouth daily. 11/23/20   [name], Henrine Screws T, NP  atorvastatin (LIPITOR) 80 MG tablet Take 1 tablet (80 mg total) by mouth daily. 11/26/20 02/19/22  [name], Henrine Screws T, NP  Blood Glucose Monitoring Suppl (ONETOUCH VERIO) w/Device KIT Use to check blood sugar 3 to 4 times a day and document.  Please bring to visits for review. 07/08/20   [name] T, NP  Cholecalciferol 125 MCG (5000 UT) TABS Take 1 tablet (5,000 Units total) by mouth daily. 12/04/20   [name], NP  Elastic Bandages & Supports (MEDICAL COMPRESSION  STOCKINGS) MISC 1 Package by Does not apply route daily. 08/28/20   Sable Feil, PA-C  empagliflozin (JARDIANCE) 10 MG TABS tablet Take 1 tablet (10 mg total) by mouth daily. 05/11/21   [name], Henrine Screws T, NP  ezetimibe (ZETIA) 10 MG tablet Take 1 tablet (10 mg total) by mouth daily. 05/11/21   [name], Henrine Screws T, NP  furosemide (LASIX) 40 MG tablet TAKE 2 TABLETS BY MOUTH ONCE EVERY MORNING AND 1 TABLET ONCE EVERY EVENING 05/11/21   [name], [name] T, NP  glucose blood (ONETOUCH VERIO) test strip Use to check blood sugar 3 to 4 times a day. 07/08/20   [name], Henrine Screws T, NP  metoprolol succinate (TOPROL XL) 50 MG 24 hr tablet Take 1 tablet (50 mg total) by mouth daily. 05/11/21   [name], NP  MODERNA COVID-19 VACCINE  100 MCG/0.5ML injection  08/28/20   [provider]  nitroGLYCERIN (NITROSTAT) 0.4 MG SL tablet Place 1 tablet (0.4 mg total) under the tongue every 5 (five) minutes as needed for chest pain. 05/13/21   [name], Henrine Screws T, NP  nystatin (MYCOSTATIN/NYSTOP) powder Apply 1 application topically 3 (three) times daily. 05/11/21   [name], Henrine Screws T, NP  olmesartan (BENICAR) 5 MG tablet Take 2 tablets (10 mg total) by mouth daily. 12/03/20   [name], Henrine Screws T, NP  Omega-3 Fatty Acids (OMEGA 3 500 PO) Take 500 mg by mouth daily.    [provider]  OneTouch Delica Lancets 45G MISC Use to check blood sugar 2-3 times a day. 09/29/20   [name], Henrine Screws T, NP  pantoprazole (PROTONIX) 40 MG tablet Take 1 tablet (40 mg total) by mouth daily. 04/12/21   [name], Henrine Screws T, NP  predniSONE (DELTASONE) 20 MG tablet Take 3 tablets (60 mg total) by mouth daily. Start on 03/04/21 03/03/21   Ward, Delice Bison, DO  risperiDONE (RISPERDAL) 0.5 MG tablet Take 1 tablet (0.5 mg total) by mouth 2 (two) times daily. 05/11/21   [name], Henrine Screws T, NP  rivaroxaban (XARELTO) 20 MG TABS tablet Take 1 tablet (20 mg total) by mouth at bedtime. 07/08/20   [name] T, NP  sharps container 1 each by Does not  apply route as needed. 09/29/20   [name], NP  Skin Protectants, Misc. (EUCERIN) cream Apply topically as needed for dry skin. 08/28/20   Sable Feil, PA-C  SYMBICORT 80-4.5 MCG/ACT inhaler INHALE 2 PUFFS BY MOUTH TWICE DAILY 05/10/21   [name], Henrine Screws T, NP  triamcinolone cream (KENALOG) 0.1 % APPLY TO AFFECTED AREAS TWICE DAILY 05/10/21   [name], Henrine Screws T, NP  vitamin B-12 (CYANOCOBALAMIN) 1000 MCG tablet Take 1 tablet (1,000 mcg total) by mouth daily. 12/04/20   [name] T, NP    Allergies Penicillin g, Sulfa antibiotics, Tiotropium, and Zoloft [sertraline hcl]    Social History Social History   Tobacco Use   Smoking status: Never   Smokeless tobacco: Never  Vaping Use   Vaping Use: Never used  Substance Use Topics   Alcohol use: No   Drug use: No    Review of Systems Patient denies headaches, rhinorrhea, blurry vision, numbness, shortness of breath, chest pain, edema, cough, abdominal pain, nausea, vomiting, diarrhea, dysuria, fevers, rashes or hallucinations unless otherwise stated above in HPI. ____________________________________________   PHYSICAL EXAM:  VITAL SIGNS: Vitals:   05/31/21 1445 05/31/21 1451  BP: (!) 146/98   Pulse: (!) 115   Resp: 18   Temp: 98.3 F (36.8 C) 98.3 F (36.8 C)  SpO2: 99%     Constitutional: Alert and oriented.  Eyes: Conjunctivae are normal.  Head: Atraumatic. Nose: No congestion/rhinnorhea. Mouth/Throat: Mucous membranes are moist.   Neck: No stridor. Painless ROM.  Cardiovascular: Normal rate, regular rhythm. Grossly normal heart sounds.  Good peripheral circulation. Respiratory: Normal respiratory effort.  No retractions. Lungs CTAB. Gastrointestinal: Soft and nontender. No distention. No abdominal bruits. No CVA tenderness. Genitourinary:  Musculoskeletal: No lower extremity tenderness nor edema.  No joint effusions. Neurologic:  Normal speech and language. No gross focal neurologic deficits are  appreciated. No facial droop Skin:  Skin is warm, dry and intact.  Psychiatric: calm, cooperative, denies any SI or HI  ____________________________________________   LABS (all labs ordered are listed, but only abnormal results are displayed)  Results for orders placed or performed during the hospital encounter of 05/31/21 (from  the past 24 hour(s))  Comprehensive metabolic panel     Status: Abnormal   Collection Time: 05/31/21  2:44 PM  Result Value Ref Range   Sodium 139 135 - 145 mmol/L   Potassium 4.4 3.5 - 5.1 mmol/L   Chloride 108 98 - 111 mmol/L   CO2 24 22 - 32 mmol/L   Glucose, Bld 126 (H) 70 - 99 mg/dL   BUN 20 8 - 23 mg/dL   Creatinine, Ser 0.84 0.61 - 1.24 mg/dL   Calcium 9.2 8.9 - 10.3 mg/dL   Total Protein 7.9 6.5 - 8.1 g/dL   Albumin 4.2 3.5 - 5.0 g/dL   AST 21 15 - 41 U/L   ALT 20 0 - 44 U/L   Alkaline Phosphatase 81 38 - 126 U/L   Total Bilirubin 1.1 0.3 - 1.2 mg/dL   GFR, Estimated >60 >60 mL/min   Anion gap 7 5 - 15  Ethanol     Status: None   Collection Time: 05/31/21  2:44 PM  Result Value Ref Range   Alcohol, Ethyl (B) <62 <94 mg/dL  Salicylate level     Status: Abnormal   Collection Time: 05/31/21  2:44 PM  Result Value Ref Range   Salicylate Lvl <7.6 (L) 7.0 - 30.0 mg/dL  Acetaminophen level     Status: Abnormal   Collection Time: 05/31/21  2:44 PM  Result Value Ref Range   Acetaminophen (Tylenol), Serum <10 (L) 10 - 30 ug/mL  cbc     Status: Abnormal   Collection Time: 05/31/21  2:44 PM  Result Value Ref Range   WBC 11.7 (H) 4.0 - 10.5 K/uL   RBC 5.29 4.22 - 5.81 MIL/uL   Hemoglobin 15.8 13.0 - 17.0 g/dL   HCT 47.1 39.0 - 52.0 %   MCV 89.0 80.0 - 100.0 fL   MCH 29.9 26.0 - 34.0 pg   MCHC 33.5 30.0 - 36.0 g/dL   RDW 14.9 11.5 - 15.5 %   Platelets 249 150 - 400 K/uL   nRBC 0.0 0.0 - 0.2 %  Troponin I (High Sensitivity)     Status: None   Collection Time: 05/31/21  2:44 PM  Result Value Ref Range   Troponin I (High Sensitivity) 17 <18  ng/L  Troponin I (High Sensitivity)     Status: None   Collection Time: 05/31/21  4:59 PM  Result Value Ref Range   Troponin I (High Sensitivity) 16 <18 ng/L   ____________________________________________  EKG My review and personal interpretation at Time: 16:17   Indication: chest pressure  Rate: 95  Rhythm: afib Axis: normal Other: afib with pvc, nonspecific st abn, no stemi ____________________________________________  RADIOLOGY  I personally reviewed all radiographic images ordered to evaluate for the above acute complaints and reviewed radiology reports and findings.  These findings were personally discussed with the patient.  Please see medical record for radiology report.  ____________________________________________   PROCEDURES  Procedure(s) performed:  Procedures    Critical Care performed: no ____________________________________________   INITIAL IMPRESSION / ASSESSMENT AND PLAN / ED COURSE  Pertinent labs & imaging results that were available during my care of the patient were reviewed by me and considered in my medical decision making (see chart for details).   DDX: acs, chf, pna, anxiety, delusions, depression, doubt pe or dissection  Evan [name] is a 65 y.o. who presents to the ED with symptoms as described above.  Patient nontoxic-appearing.  Patient is pleasant and cooperative.  He  appears to be at his baseline.  He is denying any hallucinations SI or HI.  He is primarily concerned about some palpitations chest discomfort that started this morning.  Has a history of A. fib but states been compliant with his medications including Xarelto.  Has not hypoxic.  Exam is otherwise reassuring.  Will order serial enzymes.  This does not seem consistent with dissection or PE.  Clinical Course as of 05/31/21 1756  Tue May 31, 2021  1755 Patient's blood work is reassuring.  Remains hemodynamically stable in no acute distress.  On reexamination he does have some  cellulitic changes to right lower extremity.  Not consistent with a DVT there is no pitting edema and he is on Xarelto is well perfused.  Given his mild white count does have some complaint of some pain in that area I will cover him with antibiotics.  He has an allergy to penicillins as well as sulfa antibiotic therefore will put on clinda which is tolerated in the past.  This point do believe he stable and appropriate for outpatient follow-up.  He has no additional complaints and appears at his baseline. [PR]    Clinical Course User Index [PR] Merlyn Lot, MD    The patient was evaluated in Emergency Department today for the symptoms described in the history of present illness. He/she was evaluated in the context of the global COVID-19 pandemic, which necessitated consideration that the patient might be at risk for infection with the SARS-CoV-2 virus that causes COVID-19. Institutional protocols and algorithms that pertain to the evaluation of patients at risk for COVID-19 are in a state of rapid change based on information released by regulatory bodies including the CDC and federal and state organizations. These policies and algorithms were followed during the patient's care in the ED.  As part of my medical decision making, I reviewed the following data within the [place] notes reviewed and incorporated, Labs reviewed, notes from prior ED visits and [place] Controlled Substance Database   ____________________________________________   FINAL CLINICAL IMPRESSION(S) / ED DIAGNOSES  Final diagnoses:  Chest discomfort  Cellulitis of right leg      NEW MEDICATIONS STARTED DURING THIS VISIT:  New Prescriptions   CLINDAMYCIN (CLEOCIN) 300 MG CAPSULE    Take 1 capsule (300 mg total) by mouth 3 (three) times daily for 7 days.   SACCHAROMYCES BOULARDII (PROBIOTIC) 250 MG CAPS    Take 1 capsule by mouth in the morning and at bedtime.     Note:  This document was  prepared using Dragon voice recognition software and may include unintentional dictation errors.    Merlyn Lot, MD 05/31/21 [phone]

## 2021-05-31 NOTE — ED Notes (Signed)
Repeat trop sent to lab

## 2021-06-02 NOTE — Telephone Encounter (Signed)
Copied from [place] [phone]. Topic: General - Other >> Jun 02, 2021 10:23 AM [place] wrote: Reason for CRM: Pt was calling to state he got meds, he was in AFIB a while ago and he is ok and behaving.  He just will need some help coming in a couple days a wk [phone] if update on help would be great.

## 2021-06-06 DIAGNOSIS — I25118 Atherosclerotic heart disease of native coronary artery with other forms of angina pectoris: Secondary | ICD-10-CM

## 2021-06-06 DIAGNOSIS — E1169 Type 2 diabetes mellitus with other specified complication: Secondary | ICD-10-CM

## 2021-06-06 DIAGNOSIS — E785 Hyperlipidemia, unspecified: Secondary | ICD-10-CM

## 2021-06-06 DIAGNOSIS — I152 Hypertension secondary to endocrine disorders: Secondary | ICD-10-CM

## 2021-06-06 DIAGNOSIS — E1159 Type 2 diabetes mellitus with other circulatory complications: Secondary | ICD-10-CM | POA: Diagnosis not present

## 2021-06-06 DIAGNOSIS — F339 Major depressive disorder, recurrent, unspecified: Secondary | ICD-10-CM

## 2021-06-07 ENCOUNTER — Ambulatory Visit (INDEPENDENT_AMBULATORY_CARE_PROVIDER_SITE_OTHER): Payer: Medicare Other | Admitting: Licensed Clinical Social Worker

## 2021-06-07 DIAGNOSIS — F419 Anxiety disorder, unspecified: Secondary | ICD-10-CM

## 2021-06-07 DIAGNOSIS — F423 Hoarding disorder: Secondary | ICD-10-CM

## 2021-06-07 DIAGNOSIS — I152 Hypertension secondary to endocrine disorders: Secondary | ICD-10-CM

## 2021-06-07 DIAGNOSIS — E1159 Type 2 diabetes mellitus with other circulatory complications: Secondary | ICD-10-CM

## 2021-06-07 DIAGNOSIS — I5032 Chronic diastolic (congestive) heart failure: Secondary | ICD-10-CM

## 2021-06-07 DIAGNOSIS — E785 Hyperlipidemia, unspecified: Secondary | ICD-10-CM

## 2021-06-07 DIAGNOSIS — E1169 Type 2 diabetes mellitus with other specified complication: Secondary | ICD-10-CM

## 2021-06-07 NOTE — Chronic Care Management (AMB) (Signed)
Chronic Care Management    Clinical Social Work Note  06/07/2021 Name: GARRISON MICHIE MRN: [id number] DOB: 1956/03/15  [name] Fusco is a 65 y.o. year old male who is a primary care patient of [name], [name], NP. The CCM team was consulted to assist the patient with chronic disease management and/or care coordination needs related to: Mental Health Counseling and Resources.   Engaged with patient by telephone for follow up visit in response to provider referral for social work chronic care management and care coordination services.   Consent to Services:  The patient was given information about Chronic Care Management services, agreed to services, and gave verbal consent prior to initiation of services.  Please see initial visit note for detailed documentation.   Patient agreed to services and consent obtained.   Consent to Services:  The patient was given information about Care Management services, agreed to services, and gave verbal consent prior to initiation of services.  Please see initial visit note for detailed documentation.   Patient agreed to services today and consent obtained.  Engaged with patient by phone in response to provider referral for social work care coordination services:  Assessment/Interventions:  Patient continues to maintain positive progress with care plan goals. He is in the process of obtaining a new home care agency and awaiting paperwork from [place]. Strategies to assist with stress management discussed  See Care Plan below for interventions and patient self-care actives.  Recent life changes or stressors: Management of health conditions  Recommendation: Patient may benefit from, and is in agreement work with LCSW to address care coordination needs and will continue to work with the clinical team to address health care and disease management related needs.   Follow up Plan: Patient would like continued follow-up from CCM LCSW .  per patient's request  will follow up in 06/23/21.  Will call office if needed prior to next encounter.  SDOH (Social Determinants of Health) assessments and interventions performed:    Advanced Directives Status: Not addressed in this encounter.  CCM Care Plan  Allergies  Allergen Reactions   Penicillin G Hives   Sulfa Antibiotics Hives   Tiotropium    Zoloft [Sertraline Hcl] Other (See Comments)    Outpatient Encounter Medications as of 06/07/2021  Medication Sig   acetaminophen (TYLENOL) 500 MG tablet Take 500 mg by mouth every 6 (six) hours as needed.   albuterol (VENTOLIN HFA) 108 (90 Base) MCG/ACT inhaler Inhale 2 puffs into the lungs every 4 (four) hours as needed for wheezing or shortness of breath.   aspirin 81 MG chewable tablet Chew 1 tablet (81 mg total) by mouth daily.   atorvastatin (LIPITOR) 80 MG tablet Take 1 tablet (80 mg total) by mouth daily.   Blood Glucose Monitoring Suppl (ONETOUCH VERIO) w/Device KIT Use to check blood sugar 3 to 4 times a day and document.  Please bring to visits for review.   Cholecalciferol 125 MCG (5000 UT) TABS Take 1 tablet (5,000 Units total) by mouth daily.   clindamycin (CLEOCIN) 300 MG capsule Take 1 capsule (300 mg total) by mouth 3 (three) times daily for 7 days.   Elastic Bandages & Supports (MEDICAL COMPRESSION STOCKINGS) MISC 1 Package by Does not apply route daily.   empagliflozin (JARDIANCE) 10 MG TABS tablet Take 1 tablet (10 mg total) by mouth daily.   ezetimibe (ZETIA) 10 MG tablet Take 1 tablet (10 mg total) by mouth daily.   furosemide (LASIX) 40 MG tablet TAKE 2  TABLETS BY MOUTH ONCE EVERY MORNING AND 1 TABLET ONCE EVERY EVENING   glucose blood (ONETOUCH VERIO) test strip Use to check blood sugar 3 to 4 times a day.   metoprolol succinate (TOPROL XL) 50 MG 24 hr tablet Take 1 tablet (50 mg total) by mouth daily.   MODERNA COVID-19 VACCINE 100 MCG/0.5ML injection    nitroGLYCERIN (NITROSTAT) 0.4 MG SL tablet Place 1 tablet (0.4 mg total) under  the tongue every 5 (five) minutes as needed for chest pain.   nystatin (MYCOSTATIN/NYSTOP) powder Apply 1 application topically 3 (three) times daily.   olmesartan (BENICAR) 5 MG tablet Take 2 tablets (10 mg total) by mouth daily.   Omega-3 Fatty Acids (OMEGA 3 500 PO) Take 500 mg by mouth daily.   OneTouch Delica Lancets 09X MISC Use to check blood sugar 2-3 times a day.   pantoprazole (PROTONIX) 40 MG tablet Take 1 tablet (40 mg total) by mouth daily.   predniSONE (DELTASONE) 20 MG tablet Take 3 tablets (60 mg total) by mouth daily. Start on 03/04/21   risperiDONE (RISPERDAL) 0.5 MG tablet Take 1 tablet (0.5 mg total) by mouth 2 (two) times daily.   rivaroxaban (XARELTO) 20 MG TABS tablet Take 1 tablet (20 mg total) by mouth at bedtime.   Saccharomyces boulardii (PROBIOTIC) 250 MG CAPS Take 1 capsule by mouth in the morning and at bedtime.   sharps container 1 each by Does not apply route as needed.   Skin Protectants, Misc. (EUCERIN) cream Apply topically as needed for dry skin.   SYMBICORT 80-4.5 MCG/ACT inhaler INHALE 2 PUFFS BY MOUTH TWICE DAILY   triamcinolone cream (KENALOG) 0.1 % APPLY TO AFFECTED AREAS TWICE DAILY   vitamin B-12 (CYANOCOBALAMIN) 1000 MCG tablet Take 1 tablet (1,000 mcg total) by mouth daily.   No facility-administered encounter medications on file as of 06/07/2021.    Patient Active Problem List   Diagnosis Date Noted   Delusional disorder ([place]) 03/03/2021   Other thrombophilia ([place]) 12/25/2020   Vitamin D deficiency 12/04/2020   Vitamin B12 deficiency 12/04/2020   Stenosis of right carotid artery 12/03/2020   History of CVA (cerebrovascular accident) 11/06/2020   History of 2019 novel coronavirus disease (COVID-19) 09/27/2020   Atherosclerosis of aorta ([place]) 12/04/2019   Persistent atrial fibrillation ([place]) 09/12/2019   CAD (coronary artery disease) 09/12/2019   Chronic venous stasis 09/12/2019   Hoarding disorder 09/12/2019   Cervical spinal stenosis  09/12/2019   Diabetic retinopathy ([place]) 09/12/2019   COPD, mild ([place]) 09/12/2019   Osteoporosis 09/09/2019   History of prostate cancer 09/09/2019   Anxiety 08/10/2019   Obstructive sleep apnea on CPAP 08/10/2019   Hyperlipidemia associated with type 2 diabetes mellitus ([place]) 08/10/2019   Chronic diastolic CHF (congestive heart failure) ([place]) 01/09/2014   Esophageal dysmotility 09/12/2013   Type 2 diabetes mellitus with morbid obesity ([place]) 02/14/2012   Obesity, morbid (more than 100 lbs over ideal weight or BMI > 40) ([place]) 07/26/2011   OLD MYOCARDIAL INFARCTION 03/02/2010   Hypertension associated with diabetes ([place]) 03/20/2009   CORONARY ATHEROSCLEROSIS, ARTERY BYPASS GRAFT 03/20/2009    Conditions to be addressed/monitored: Anxiety and Delusional Disorder ; Mental Health Concerns   Care Plan : General Social Work (Adult)  Updates made by Rebekah Chesterfield, LCSW since 06/07/2021 12:00 AM     Problem: Response to Treatment (Depression)      Goal: Response to Treatment Maximized   Start Date: 12/27/2020  This Visit's Progress: On track  Recent Progress:  On track  Priority: High  Note:   Current barriers:   Acute Mental Health needs related to Anxiety Housing barriers, Level of care concerns, and Mental Health Concerns  Needs Support, Education, and Care Coordination in order to meet unmet mental health needs. Clinical Goal(s): Over the next 120 days, patient will work with SW to reduce or manage symptoms of anxiety and increase knowledge and/or ability of: coping skills, healthy habits, self-management skills, and stress reduction.until connected for ongoing counseling.  Clinical Interventions:  Assessed patient's previous treatment, needs, coping skills, current treatment, support system and barriers to care Patient interviewed and appropriate assessments performed Provided mental health counseling with regard to managing mental health conditions Patient reports difficulty  managing symptoms of anxiety triggered by chronic health conditions Patient is upset about the way he is treated by multiple personal aids, stating that current aid is dishonest about why she does not show to work. CCM LCSW strongly encouraged patient to contact agency about his concerns to reach a resolution 09/08: [name] is back as his guide for 27 hrs a week [place] [phone] 09/26: Patient informed CCM LCSW that [name] will provide transportation for his upcoming PCP appt scheduled 05/11/21. This information was verified by [name], pt's new aid that will be assisting with ADL's in the home on Mondays, Wednesdays, and Fridays 11/1: Patient reports that he is no longer receiving services from [place]. He has contacted [name] and is awaiting paperwork regarding home care agencies CCM LCSW discussed CBT strategies to assist with managing symptoms  Patient has scheduled transportation assistance through [employer] to run errands for next week. He is proud of this noting that he does not want to continue using aid for transportation needs Validation and encouragement was provided during call. CCM LCSW encouraged patient to continue reflecting on past experiences and current friendships to promote positive mood and stress management. States he has enjoyed playing cards online with friends and praying to cope with stressors. 10/10 Patient shared that recent stress has been alleviated with drawing and talking with family and friends  CCM LCSW provided a supportive environment to allow patient to process his emotions 10/10: Patient reports compliance with medications. Patient utilizes medicine cabinet with labels, AM and PM, to assist with medication management Patient reports that his strong spiritual relationship keeps him encouraged to move forward instead of focusing on past events 11/1: Patient enjoys walking to assist with stress management Patient reports becoming "stir  crazy" from staying at home for majority of the day. CCM LCSW discussed benefits of participating in [facility]. Patient reports hx of attending [facility]. States that he disenrolled because staff and participants believed he was "delusional" because he has close relationships with various celebrities. Patient is not interested in initiating psychiatry and/or counseling to assist with strengthening support system 09/08: Patient reports that he participates in a group weekly to promote socialization with others CCM LCSW discussed strategies to improve communication skills with aid to assist in preventing misunderstandings or offending others  CCM LCSW reviewed upcoming appointments with patient. He reports that he is utilizing [employer] for transportation. His aid has offered to bring patient to local stores to obtain cleaning supplies. Patient reports that he is aware that he can also utilize [employer] for transportation to medical appointments 10/10: Patient has a ride scheduled for 10/18 to take him grocery shopping CCM LCSW discussed strategies to assist patient with memory concerns. Patient agreed to write notes and post them in high traffic  areas of the home to remind him of upcoming appts and/or medications CCM LCSW collaborated with PCP and CCM RN regarding concerns of medication adherence. LCSW informed RNCM of patient's request for a f/up call regarding questions about his cholesterol levels and strategies to manage condition Other interventions: Solution-Focused Strategies, Mindfulness or Relaxation Training, Active listening / Reflection utilized , Emotional Supportive Provided, Psychoeducation for mental health needs , Motivational Interviewing, Participation in counseling encouraged , Participation in support group encouraged , Consideration of in-home help encouraged , and Verbalization of feelings encouraged   Discussed plans with patient for ongoing care management follow up and provided patient with  direct contact information for care management team Collaboration with PCP regarding development and update of comprehensive plan of care as evidenced by provider attestation and co-signature Inter-disciplinary care team collaboration (see longitudinal plan of care) Patient Goals/Self-Care Activities: Over the next 120 days Attend all scheduled appointments with providers Contact office with any questions or concerns Utilize [facility] transportation number to assist with attending medical appointments Practice relaxation or meditation daily Practice positive thinking and self-talk      [name], MSW, [email or web address] Phone [phone] 5:49 PM

## 2021-06-07 NOTE — Patient Instructions (Signed)
Visit Information  Patient verbalizes understanding of instructions provided today and agrees to view in Excel.   Telephone follow up appointment with care management team member scheduled for:06/23/21  [name], MSW, [email or web address] Phone [phone] 5:56 PM

## 2021-06-08 ENCOUNTER — Emergency Department: Payer: Medicare Other

## 2021-06-08 ENCOUNTER — Emergency Department
Admission: EM | Admit: 2021-06-08 | Discharge: 2021-06-08 | Disposition: A | Discharge status: Home / Self Care | Payer: Medicare Other | Attending: Emergency Medicine | Admitting: Emergency Medicine

## 2021-06-08 ENCOUNTER — Other Ambulatory Visit: Payer: Self-pay

## 2021-06-08 ENCOUNTER — Telehealth: Payer: Self-pay | Admitting: Nurse Practitioner

## 2021-06-08 ENCOUNTER — Encounter: Payer: Self-pay | Admitting: Emergency Medicine

## 2021-06-08 DIAGNOSIS — I2510 Atherosclerotic heart disease of native coronary artery without angina pectoris: Secondary | ICD-10-CM | POA: Diagnosis not present

## 2021-06-08 DIAGNOSIS — Z7951 Long term (current) use of inhaled steroids: Secondary | ICD-10-CM | POA: Diagnosis not present

## 2021-06-08 DIAGNOSIS — Z7982 Long term (current) use of aspirin: Secondary | ICD-10-CM | POA: Diagnosis not present

## 2021-06-08 DIAGNOSIS — J449 Chronic obstructive pulmonary disease, unspecified: Secondary | ICD-10-CM | POA: Insufficient documentation

## 2021-06-08 DIAGNOSIS — I5032 Chronic diastolic (congestive) heart failure: Secondary | ICD-10-CM | POA: Insufficient documentation

## 2021-06-08 DIAGNOSIS — I480 Paroxysmal atrial fibrillation: Secondary | ICD-10-CM | POA: Diagnosis not present

## 2021-06-08 DIAGNOSIS — Z8616 Personal history of COVID-19: Secondary | ICD-10-CM | POA: Diagnosis not present

## 2021-06-08 DIAGNOSIS — Z7984 Long term (current) use of oral hypoglycemic drugs: Secondary | ICD-10-CM | POA: Diagnosis not present

## 2021-06-08 DIAGNOSIS — J45909 Unspecified asthma, uncomplicated: Secondary | ICD-10-CM | POA: Diagnosis not present

## 2021-06-08 DIAGNOSIS — I4891 Unspecified atrial fibrillation: Secondary | ICD-10-CM | POA: Diagnosis not present

## 2021-06-08 DIAGNOSIS — Z79899 Other long term (current) drug therapy: Secondary | ICD-10-CM | POA: Diagnosis not present

## 2021-06-08 DIAGNOSIS — R002 Palpitations: Secondary | ICD-10-CM

## 2021-06-08 DIAGNOSIS — I110 Hypertensive heart disease with heart failure: Secondary | ICD-10-CM | POA: Diagnosis not present

## 2021-06-08 DIAGNOSIS — Z951 Presence of aortocoronary bypass graft: Secondary | ICD-10-CM | POA: Diagnosis not present

## 2021-06-08 DIAGNOSIS — E1169 Type 2 diabetes mellitus with other specified complication: Secondary | ICD-10-CM | POA: Insufficient documentation

## 2021-06-08 DIAGNOSIS — E11319 Type 2 diabetes mellitus with unspecified diabetic retinopathy without macular edema: Secondary | ICD-10-CM | POA: Diagnosis not present

## 2021-06-08 DIAGNOSIS — R079 Chest pain, unspecified: Secondary | ICD-10-CM | POA: Diagnosis not present

## 2021-06-08 DIAGNOSIS — R0789 Other chest pain: Secondary | ICD-10-CM | POA: Diagnosis not present

## 2021-06-08 DIAGNOSIS — I499 Cardiac arrhythmia, unspecified: Secondary | ICD-10-CM | POA: Diagnosis not present

## 2021-06-08 DIAGNOSIS — Z743 Need for continuous supervision: Secondary | ICD-10-CM | POA: Diagnosis not present

## 2021-06-08 LAB — COMPREHENSIVE METABOLIC PANEL
ALT: 20 U/L (ref 0–44)
AST: 21 U/L (ref 15–41)
Albumin: 3.6 g/dL (ref 3.5–5.0)
Alkaline Phosphatase: 79 U/L (ref 38–126)
Anion gap: 8 (ref 5–15)
BUN: 24 mg/dL — ABNORMAL HIGH (ref 8–23)
CO2: 25 mmol/L (ref 22–32)
Calcium: 9.3 mg/dL (ref 8.9–10.3)
Chloride: 105 mmol/L (ref 98–111)
Creatinine, Ser: 0.78 mg/dL (ref 0.61–1.24)
GFR, Estimated: >60 mL/min (ref >60)
Glucose, Bld: 145 mg/dL — ABNORMAL HIGH (ref 70–99)
Potassium: 4.3 mmol/L (ref 3.5–5.1)
Sodium: 138 mmol/L (ref 135–145)
Total Bilirubin: 0.9 mg/dL (ref 0.3–1.2)
Total Protein: 6.7 g/dL (ref 6.5–8.1)

## 2021-06-08 LAB — CBC WITH DIFFERENTIAL/PLATELET
Abs Immature Granulocytes: 0.03 10*3/uL (ref 0.00–0.07)
Basophils Absolute: 0.1 10*3/uL (ref 0.0–0.1)
Basophils Relative: 1 %
Eosinophils Absolute: 0.2 10*3/uL (ref 0.0–0.5)
Eosinophils Relative: 2 %
HCT: 44.3 % (ref 39.0–52.0)
Hemoglobin: 14.4 g/dL (ref 13.0–17.0)
Immature Granulocytes: 0 %
Lymphocytes Relative: 17 %
Lymphs Abs: 1.9 10*3/uL (ref 0.7–4.0)
MCH: 28.8 pg (ref 26.0–34.0)
MCHC: 32.5 g/dL (ref 30.0–36.0)
MCV: 88.6 fL (ref 80.0–100.0)
Monocytes Absolute: 0.8 10*3/uL (ref 0.1–1.0)
Monocytes Relative: 7 %
Neutro Abs: 8.1 10*3/uL — ABNORMAL HIGH (ref 1.7–7.7)
Neutrophils Relative %: 73 %
Platelets: 237 10*3/uL (ref 150–400)
RBC: 5 MIL/uL (ref 4.22–5.81)
RDW: 15.1 % (ref 11.5–15.5)
WBC: 11 10*3/uL — ABNORMAL HIGH (ref 4.0–10.5)
nRBC: 0 % (ref 0.0–0.2)

## 2021-06-08 LAB — TROPONIN I (HIGH SENSITIVITY): Troponin I (High Sensitivity): 17 ng/L (ref <18)

## 2021-06-08 IMAGING — CR DG CHEST 2V
2 series · 2 of 2 positions shown · non-contrast
Comparison: Chest x-ray [DATE].

CLINICAL DATA: 65-year-old male with history of chest pressure.

EXAM:
CHEST - 2 VIEW

[chest pa]
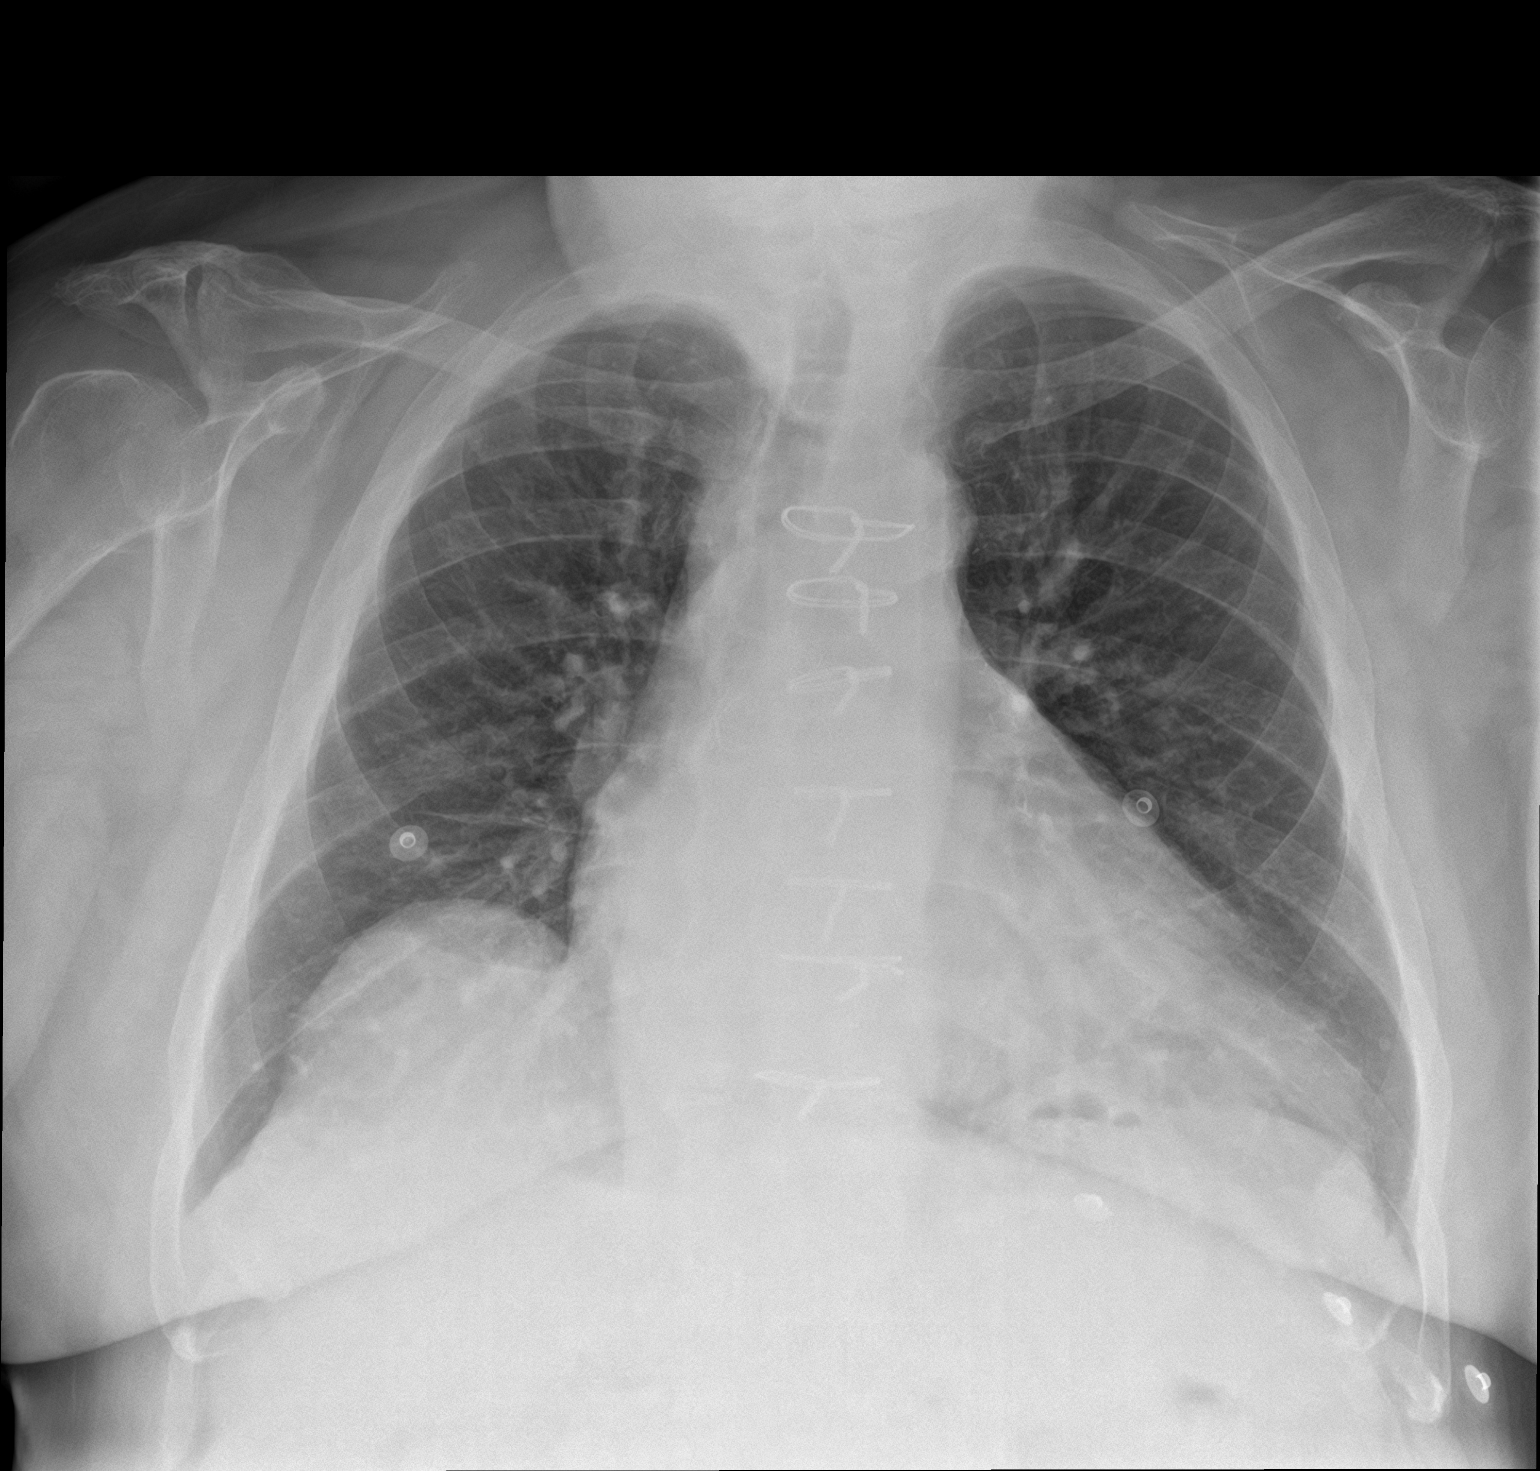

[chest lat]
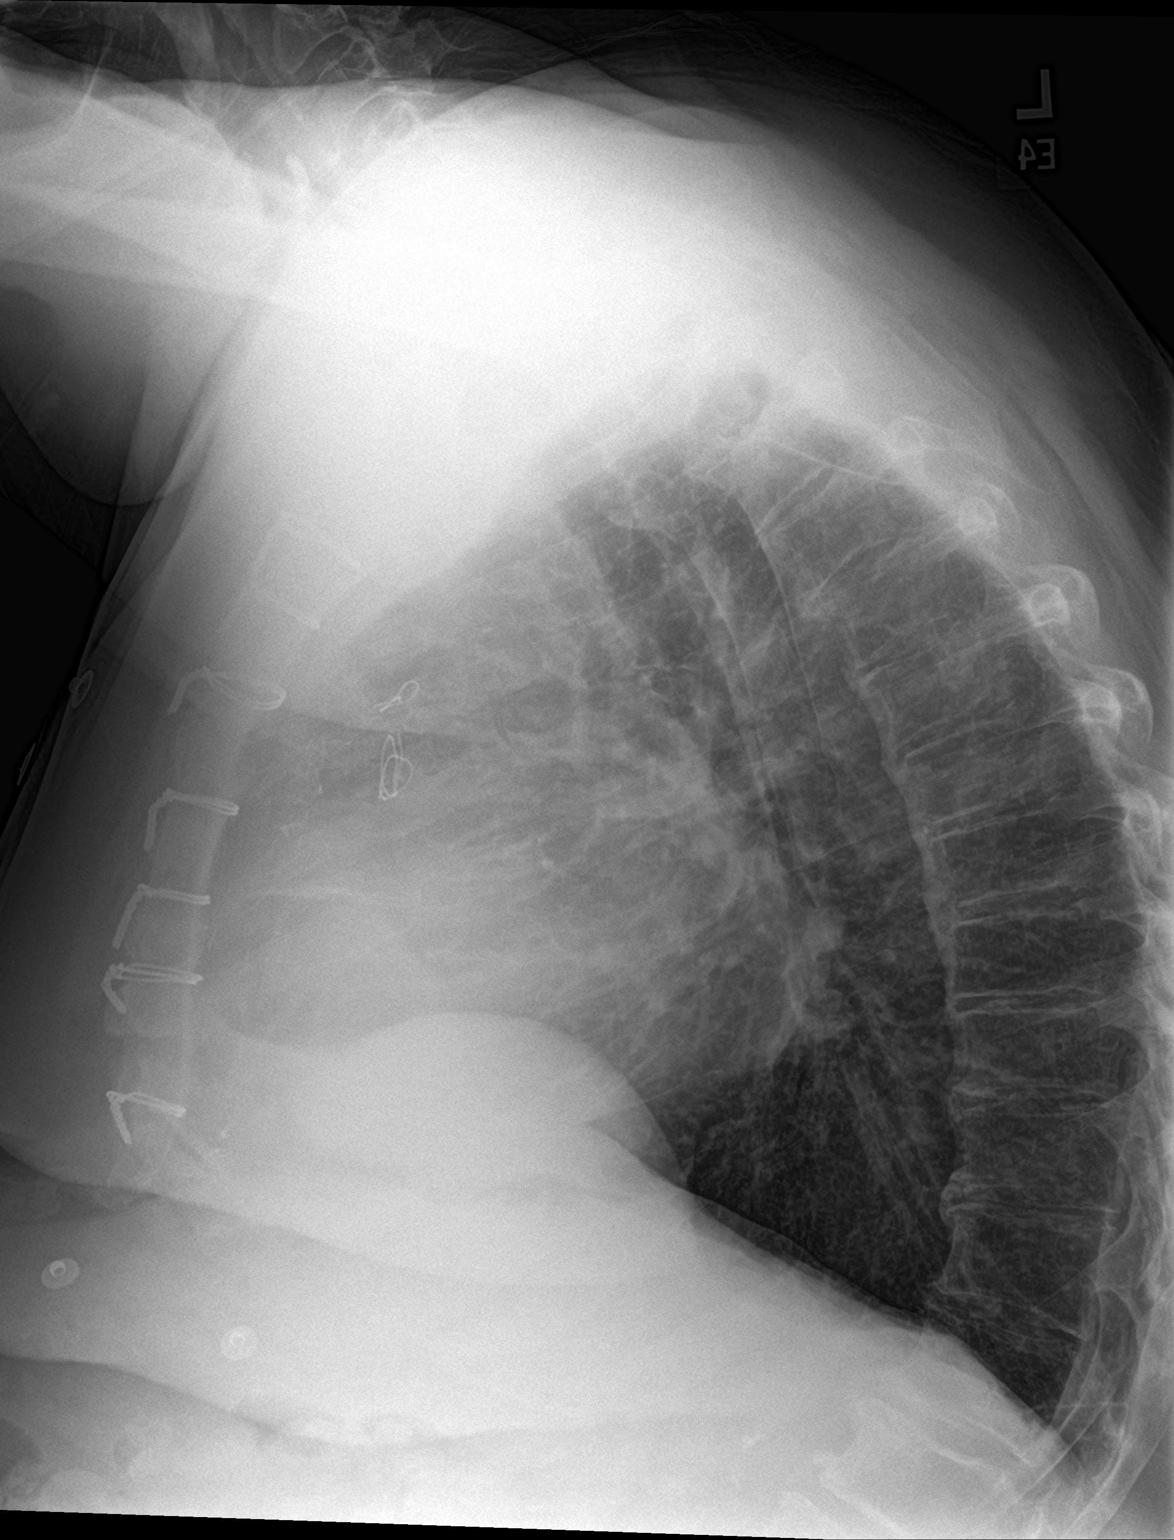

[2 of 2 positions shown; findings below may reference images not displayed]

FINDINGS: Lung volumes are normal. No consolidative airspace disease. No
pleural effusions. No pneumothorax. No pulmonary nodule or mass
noted. Pulmonary vasculature and the cardiomediastinal silhouette
are within normal limits. Status post median sternotomy for CABG.
IMPRESSION: 1.  No radiographic evidence of acute cardiopulmonary disease.

## 2021-06-08 NOTE — ED Triage Notes (Signed)
Pt to triage via w/c with no distress noted, brought in by EMS; reports "chest pressure" this morning; denies any accomp symptoms; st hx of same with afib

## 2021-06-08 NOTE — ED Provider Notes (Signed)
[facility] Emergency Department Provider Note   ____________________________________________   Event Date/Time   First MD Initiated Contact with Patient 06/08/21 [phone]     (approximate)  I have reviewed the triage vital signs and the nursing notes.   HISTORY  Chief Complaint Chest Pain    HPI Evan [name] is a 65 y.o. male who presents for palpitations  LOCATION: Chest DURATION: Began 1 day prior to arrival TIMING: Stable since onset SEVERITY: Mild QUALITY: Palpitations CONTEXT: Patient has history of paroxysmal atrial fibrillation and began having palpitations approximately 24 hours prior to arrival MODIFYING FACTORS: Denies any exacerbating or relieving factors. ASSOCIATED SYMPTOMS: Intermittent sharp chest pain   Per medical record review, patient has history of CHF, paroxysmal atrial fibrillation, anxiety disorder, and delusions          Past Medical History:  Diagnosis Date   Anginal pain (HCC)    Anxiety disorder    Asthma    Atresia of esophagus without fistula    CAD (coronary artery disease)    Cellulitis    CHF (congestive heart failure) ([place])    NYHA CLASS III,CHRONIC,DIASTOLIC   COPD (chronic obstructive pulmonary disease) ([place])    COVID-19    Diabetes mellitus without complication ([place])    Edema    RIGHT LOWER LEG   Gastroesophageal reflux    H/O: GI bleed    History of pneumonia    Remote   History of scarlet fever    Childhood   Hyperlipidemia    Hypertension    Myocardial infarction ([place]) 2009   Obesity    Obstructive sleep apnea    Pain    CHRONIC BACK / ABDOMINAL   Panic disorder    Peripheral venous insufficiency    PTSD (post-traumatic stress disorder)    Retinopathy    DIABETIC   Stasis, venous    Stroke [facility])    Vertigo     Patient Active Problem List   Diagnosis Date Noted   Delusional disorder ([place]) 03/03/2021   Other thrombophilia ([place]) 12/25/2020   Vitamin D deficiency 12/04/2020    Vitamin B12 deficiency 12/04/2020   Stenosis of right carotid artery 12/03/2020   History of CVA (cerebrovascular accident) 11/06/2020   History of 2019 novel coronavirus disease (COVID-19) 09/27/2020   Atherosclerosis of aorta ([place]) 12/04/2019   Persistent atrial fibrillation ([place]) 09/12/2019   CAD (coronary artery disease) 09/12/2019   Chronic venous stasis 09/12/2019   Hoarding disorder 09/12/2019   Cervical spinal stenosis 09/12/2019   Diabetic retinopathy ([place]) 09/12/2019   COPD, mild ([place]) 09/12/2019   Osteoporosis 09/09/2019   History of prostate cancer 09/09/2019   Anxiety 08/10/2019   Obstructive sleep apnea on CPAP 08/10/2019   Hyperlipidemia associated with type 2 diabetes mellitus ([place]) 08/10/2019   Chronic diastolic CHF (congestive heart failure) ([place]) 01/09/2014   Esophageal dysmotility 09/12/2013   Type 2 diabetes mellitus with morbid obesity ([place]) 02/14/2012   Obesity, morbid (more than 100 lbs over ideal weight or BMI > 40) ([place]) 07/26/2011   OLD MYOCARDIAL INFARCTION 03/02/2010   Hypertension associated with diabetes ([place]) 03/20/2009   CORONARY ATHEROSCLEROSIS, ARTERY BYPASS GRAFT 03/20/2009    Past Surgical History:  Procedure Laterality Date   CARDIAC CATHETERIZATION     CATARACT EXTRACTION Left    CATARACT EXTRACTION W/PHACO Right 05/03/2017   Procedure: CATARACT EXTRACTION PHACO AND INTRAOCULAR LENS PLACEMENT (IOC);  Surgeon: Leandrew Koyanagi, MD;  Location: [facility] ORS;  Service: Ophthalmology;  Laterality: Right;  [place] 00:35.3  AP% 12.3 CDE 4.33 Fluid Pack lot # [id number]        COLONOSCOPY WITH PROPOFOL N/A 11/26/2020   Procedure: COLONOSCOPY WITH PROPOFOL;  Surgeon: Jonathon Bellows, MD;  Location[facility] ENDOSCOPY;  Service: Gastroenterology;  Laterality: N/A;  ANNET[place] TO PICK UP [phone]   CORONARY ANGIOPLASTY WITH STENT PLACEMENT  2002   CORONARY ANGIOPLASTY WITH STENT PLACEMENT  1999   CORONARY ARTERY BYPASS GRAFT     x7   ESOPHAGOGASTRODUODENOSCOPY  N/A 09/19/2016   Procedure: ESOPHAGOGASTRODUODENOSCOPY (EGD);  Surgeon: Lollie Sails, MD;  Location: [facility] ENDOSCOPY;  Service: Endoscopy;  Laterality: N/A;   ESOPHAGOGASTRODUODENOSCOPY (EGD) WITH PROPOFOL  11/26/2020   Procedure: ESOPHAGOGASTRODUODENOSCOPY (EGD) WITH PROPOFOL;  Surgeon: Jonathon Bellows, MD;  Location: [facility] ENDOSCOPY;  Service: Gastroenterology;;    Prior to Admission medications   Medication Sig Start Date End Date Taking? Authorizing Provider  acetaminophen (TYLENOL) 500 MG tablet Take 500 mg by mouth every 6 (six) hours as needed.    [provider]  albuterol (VENTOLIN HFA) 108 (90 Base) MCG/ACT inhaler Inhale 2 puffs into the lungs every 4 (four) hours as needed for wheezing or shortness of breath. 03/03/21   Ward, Delice Bison, DO  aspirin 81 MG chewable tablet Chew 1 tablet (81 mg total) by mouth daily. 11/23/20   [name], Henrine Screws T, NP  atorvastatin (LIPITOR) 80 MG tablet Take 1 tablet (80 mg total) by mouth daily. 11/26/20 02/19/22  [name], Henrine Screws T, NP  Blood Glucose Monitoring Suppl (ONETOUCH VERIO) w/Device KIT Use to check blood sugar 3 to 4 times a day and document.  Please bring to visits for review. 07/08/20   [name] T, NP  Cholecalciferol 125 MCG (5000 UT) TABS Take 1 tablet (5,000 Units total) by mouth daily. 12/04/20   [name], NP  Elastic Bandages & Supports (MEDICAL COMPRESSION STOCKINGS) MISC 1 Package by Does not apply route daily. 08/28/20   Sable Feil, PA-C  empagliflozin (JARDIANCE) 10 MG TABS tablet Take 1 tablet (10 mg total) by mouth daily. 05/11/21   [name], Henrine Screws T, NP  ezetimibe (ZETIA) 10 MG tablet Take 1 tablet (10 mg total) by mouth daily. 05/11/21   [name], Henrine Screws T, NP  furosemide (LASIX) 40 MG tablet TAKE 2 TABLETS BY MOUTH ONCE EVERY MORNING AND 1 TABLET ONCE EVERY EVENING 05/11/21   [name], [name] T, NP  glucose blood (ONETOUCH VERIO) test strip Use to check blood sugar 3 to 4 times a day. 07/08/20   [name], Henrine Screws T, NP   metoprolol succinate (TOPROL XL) 50 MG 24 hr tablet Take 1 tablet (50 mg total) by mouth daily. 05/11/21   [name], NP  MODERNA COVID-19 VACCINE 100 MCG/0.5ML injection  08/28/20   [provider]  nitroGLYCERIN (NITROSTAT) 0.4 MG SL tablet Place 1 tablet (0.4 mg total) under the tongue every 5 (five) minutes as needed for chest pain. 05/13/21   [name], Henrine Screws T, NP  nystatin (MYCOSTATIN/NYSTOP) powder Apply 1 application topically 3 (three) times daily. 05/11/21   [name], Henrine Screws T, NP  olmesartan (BENICAR) 5 MG tablet Take 2 tablets (10 mg total) by mouth daily. 12/03/20   [name], Henrine Screws T, NP  Omega-3 Fatty Acids (OMEGA 3 500 PO) Take 500 mg by mouth daily.    [provider]  OneTouch Delica Lancets 80H MISC Use to check blood sugar 2-3 times a day. 09/29/20   [name], Henrine Screws T, NP  pantoprazole (PROTONIX) 40 MG tablet Take 1 tablet (40 mg total) by mouth  daily. 04/12/21   [name], Henrine Screws T, NP  predniSONE (DELTASONE) 20 MG tablet Take 3 tablets (60 mg total) by mouth daily. Start on 03/04/21 03/03/21   Ward, Delice Bison, DO  risperiDONE (RISPERDAL) 0.5 MG tablet Take 1 tablet (0.5 mg total) by mouth 2 (two) times daily. 05/11/21   [name], Henrine Screws T, NP  rivaroxaban (XARELTO) 20 MG TABS tablet Take 1 tablet (20 mg total) by mouth at bedtime. 07/08/20   [name] T, NP  Saccharomyces boulardii (PROBIOTIC) 250 MG CAPS Take 1 capsule by mouth in the morning and at bedtime. 05/31/21   Merlyn Lot, MD  sharps container 1 each by Does not apply route as needed. 09/29/20   [name], NP  Skin Protectants, Misc. (EUCERIN) cream Apply topically as needed for dry skin. 08/28/20   Sable Feil, PA-C  SYMBICORT 80-4.5 MCG/ACT inhaler INHALE 2 PUFFS BY MOUTH TWICE DAILY 05/10/21   [name], Henrine Screws T, NP  triamcinolone cream (KENALOG) 0.1 % APPLY TO AFFECTED AREAS TWICE DAILY 05/10/21   [name], Henrine Screws T, NP  vitamin B-12 (CYANOCOBALAMIN) 1000 MCG tablet Take 1 tablet (1,000  mcg total) by mouth daily. 12/04/20   [name] T, NP    Allergies Penicillin g, Sulfa antibiotics, Tiotropium, and Zoloft [sertraline hcl]  Family History  Problem Relation Age of Onset   Heart attack Mother    Hypertension Mother    Hyperlipidemia Mother    Heart attack Brother 65       MI   Coronary artery disease Other     Social History Social History   Tobacco Use   Smoking status: Never   Smokeless tobacco: Never  Vaping Use   Vaping Use: Never used  Substance Use Topics   Alcohol use: No   Drug use: No    Review of Systems Constitutional: No fever/chills Eyes: No visual changes. ENT: No sore throat. Cardiovascular: Endorses palpitations and chest pain. Respiratory: Denies shortness of breath. Gastrointestinal: No abdominal pain.  No nausea, no vomiting.  No diarrhea. Genitourinary: Negative for dysuria. Musculoskeletal: Negative for acute arthralgias Skin: Negative for rash. Neurological: Negative for headaches, weakness/numbness/paresthesias in any extremity Psychiatric: Negative for suicidal ideation/homicidal ideation   ____________________________________________   PHYSICAL EXAM:  VITAL SIGNS: ED Triage Vitals  Enc Vitals Group     BP 06/08/21 0654 (!) 143/90     Pulse Rate 06/08/21 0654 83     Resp 06/08/21 0654 18     Temp 06/08/21 0654 98 F (36.7 C)     Temp Source 06/08/21 0654 Oral     SpO2 06/08/21 0654 95 %     Weight 06/08/21 0655 283 lb (128.4 kg)     Height 06/08/21 0655 5' 5"  (1.651 m)     Head Circumference --      Peak Flow --      Pain Score 06/08/21 0655 8     Pain Loc --      Pain Edu? --      Excl. in [place]? --    Constitutional: Alert and oriented. Well appearing and in no acute distress. Eyes: Conjunctivae are normal. PERRL. Head: Atraumatic. Nose: No congestion/rhinnorhea. Mouth/Throat: Mucous membranes are moist. Neck: No stridor Cardiovascular: Irregularly irregular rhythm.  Good peripheral  circulation. Respiratory: Normal respiratory effort.  No retractions. Gastrointestinal: Soft and nontender. No distention. Musculoskeletal: No obvious deformities Neurologic:  Normal speech and language. No gross focal neurologic deficits are appreciated. Skin:  Skin is warm and dry. No rash noted.  Psychiatric: Mood and affect are normal. Speech and behavior are normal.  ____________________________________________   LABS (all labs ordered are listed, but only abnormal results are displayed)  Labs Reviewed  CBC WITH DIFFERENTIAL/PLATELET - Abnormal; Notable for the following components:      Result Value   WBC 11.0 (*)    Neutro Abs 8.1 (*)    All other components within normal limits  COMPREHENSIVE METABOLIC PANEL - Abnormal; Notable for the following components:   Glucose, Bld 145 (*)    BUN 24 (*)    All other components within normal limits  TROPONIN I (HIGH SENSITIVITY)  TROPONIN I (HIGH SENSITIVITY)   ____________________________________________  EKG  ED ECG REPORT I, Naaman Plummer, the attending physician, personally viewed and interpreted this ECG.  Date: 06/08/2021 EKG Time: 0659 Rate: 83 Rhythm: Atrial fibrillation with PACs QRS Axis: normal Intervals: normal ST/T Wave abnormalities: normal Narrative Interpretation: Atrial fibrillation with PACs.  No evidence of acute ischemia  ____________________________________________  RADIOLOGY  ED MD interpretation: 2 view chest x-ray shows no evidence of acute abnormalities including no pneumonia, pneumothorax, or widened mediastinum  Official radiology report(s): DG Chest 2 View  Result Date: 06/08/2021 CLINICAL DATA:  65 year old male with history of chest pressure. EXAM: CHEST - 2 VIEW COMPARISON:  Chest x-ray 05/31/2021. FINDINGS: Lung volumes are normal. No consolidative airspace disease. No pleural effusions. No pneumothorax. No pulmonary nodule or mass noted. Pulmonary vasculature and the cardiomediastinal  silhouette are within normal limits. Status post median sternotomy for CABG. IMPRESSION: 1.  No radiographic evidence of acute cardiopulmonary disease. Electronically Signed   By: Vinnie Langton M.D.   On: 06/08/2021 07:32    ____________________________________________   PROCEDURES  Procedure(s) performed (including Critical Care):  .1-3 Lead EKG Interpretation Performed by: Naaman Plummer, MD Authorized by: Naaman Plummer, MD     Interpretation: abnormal     ECG rate:  82   ECG rate assessment: normal     Rhythm: atrial fibrillation     Ectopy: none     Conduction: normal     ____________________________________________   INITIAL IMPRESSION / ASSESSMENT AND PLAN / ED COURSE  As part of my medical decision making, I reviewed the following data within the electronic medical record, if available:  Nursing notes reviewed and incorporated, Labs reviewed, EKG interpreted, Old chart reviewed, Radiograph reviewed and Notes from prior ED visits reviewed and incorporated        + atrial fibrillation w/o RVR DDx: Pneumothorax, Pneumonia, Pulmonary Embolus, Tamponade, ACS, Thyrotoxicosis.  No history or evidence decompensated heart failure. Given their history and exam it is likely this patient is unlikely to spontaneously revert to a rate controlled rhythm and necessitates a thorough workup for their arrhythmia. Workup: ECG, CXR, CBC, BMP, UA, Troponin, BNP, TSH, Ca-Mag-Phos Interventions: None necessary as patient well controlled on oral antiarrhythmics  Reassessment: Patient maintained rate controlled A. fib during multi-hour observation in ED. Disposition: Discharge home with prompt PCP follow up and cardiology referral.      ____________________________________________   FINAL CLINICAL IMPRESSION(S) / ED DIAGNOSES  Final diagnoses:  Palpitations  Paroxysmal atrial fibrillation [facility])     ED Discharge Orders     None        Note:  This document was prepared  using Dragon voice recognition software and may include unintentional dictation errors.    Naaman Plummer, MD 06/08/21 [phone]

## 2021-06-08 NOTE — Telephone Encounter (Signed)
Pt called this am stating they "left him out in the road last night to die".  He is currently at the ED checked in.  He said "the guy that checked me in said "we aren't going to do anything until you mop that floor." Confirmed pt is checked in at the ED.  He said it is his heart and he has been having mini strokes all night.  He said he recorded some of the conversation before he got there.

## 2021-06-09 ENCOUNTER — Telehealth: Payer: Medicare Other

## 2021-06-14 ENCOUNTER — Ambulatory Visit: Payer: Self-pay | Admitting: Licensed Clinical Social Worker

## 2021-06-14 DIAGNOSIS — E1169 Type 2 diabetes mellitus with other specified complication: Secondary | ICD-10-CM

## 2021-06-14 DIAGNOSIS — E1159 Type 2 diabetes mellitus with other circulatory complications: Secondary | ICD-10-CM

## 2021-06-14 DIAGNOSIS — E785 Hyperlipidemia, unspecified: Secondary | ICD-10-CM

## 2021-06-14 DIAGNOSIS — I5032 Chronic diastolic (congestive) heart failure: Secondary | ICD-10-CM

## 2021-06-14 DIAGNOSIS — J449 Chronic obstructive pulmonary disease, unspecified: Secondary | ICD-10-CM

## 2021-06-14 DIAGNOSIS — F419 Anxiety disorder, unspecified: Secondary | ICD-10-CM

## 2021-06-14 DIAGNOSIS — I152 Hypertension secondary to endocrine disorders: Secondary | ICD-10-CM

## 2021-06-14 DIAGNOSIS — F22 Delusional disorders: Secondary | ICD-10-CM

## 2021-06-17 NOTE — Patient Instructions (Signed)
Visit Information  Patient Goals/Self-Care Activities: Over the next 120 days Attend all scheduled appointments with providers Contact office with any questions or concerns [facility] transportation number to assist with attending medical appointments Practice relaxation or meditation daily Practice positive thinking and self-talk  Patient verbalizes understanding of instructions provided today and agrees to view in [place].   Telephone follow up appointment with care management team member scheduled for:06/23/21  [name], [email or web address], [email or web address] Phone [phone] 8:21 AM

## 2021-06-17 NOTE — Chronic Care Management (AMB) (Signed)
Chronic Care Management    Clinical Social Work Note  06/17/2021 Name: Evan [name] MRN: [id number] DOB: 10/05/1955  Evan [name] is a 65 y.o. year old male who is a primary care patient of Evan [name], Evan Faster, NP. The CCM team was consulted to assist the patient with chronic disease management and/or care coordination needs related to: Mental Health Counseling and Resources.   Engaged with patient by telephone for follow up visit in response to provider referral for social work chronic care management and care coordination services.   Consent to Services:  The patient was given information about Chronic Care Management services, agreed to services, and gave verbal consent prior to initiation of services.  Please [name] initial visit note for detailed documentation.   Patient agreed to services and consent obtained.   Assessment: Review of patient past medical history, allergies, medications, and health status, including review of relevant consultants reports was performed today as part of a comprehensive evaluation and provision of chronic care management and care coordination services.     SDOH (Social Determinants of Health) assessments and interventions performed:    Advanced Directives Status: Not addressed in this encounter.  CCM Care Plan  Allergies  Allergen Reactions   Penicillin G Hives   Sulfa Antibiotics Hives   Tiotropium    Zoloft [Sertraline Hcl] Other ([name])    Outpatient Encounter Medications as of 06/14/2021  Medication Sig   acetaminophen (TYLENOL) 500 MG tablet Take 500 mg by mouth every 6 (six) hours as needed.   albuterol (VENTOLIN HFA) 108 (90 Base) MCG/ACT inhaler Inhale 2 puffs into the lungs every 4 (four) hours as needed for wheezing or shortness of breath.   aspirin 81 MG chewable tablet Chew 1 tablet (81 mg total) by mouth daily.   atorvastatin (LIPITOR) 80 MG tablet Take 1 tablet (80 mg total) by mouth daily.   Blood Glucose Monitoring  Suppl (ONETOUCH VERIO) w/Device KIT Use to check blood sugar 3 to 4 times a day and document.  Please bring to visits for review.   Cholecalciferol 125 MCG (5000 UT) TABS Take 1 tablet (5,000 Units total) by mouth daily.   Elastic Bandages & Supports (MEDICAL COMPRESSION STOCKINGS) MISC 1 Package by Does not apply route daily.   empagliflozin (JARDIANCE) 10 MG TABS tablet Take 1 tablet (10 mg total) by mouth daily.   ezetimibe (ZETIA) 10 MG tablet Take 1 tablet (10 mg total) by mouth daily.   furosemide (LASIX) 40 MG tablet TAKE 2 TABLETS BY MOUTH ONCE EVERY MORNING AND 1 TABLET ONCE EVERY EVENING   glucose blood (ONETOUCH VERIO) test strip Use to check blood sugar 3 to 4 times a day.   metoprolol succinate (TOPROL XL) 50 MG 24 hr tablet Take 1 tablet (50 mg total) by mouth daily.   MODERNA COVID-19 VACCINE 100 MCG/0.5ML injection    nitroGLYCERIN (NITROSTAT) 0.4 MG SL tablet Place 1 tablet (0.4 mg total) under the tongue every 5 (five) minutes as needed for chest pain.   nystatin (MYCOSTATIN/NYSTOP) powder Apply 1 application topically 3 (three) times daily.   olmesartan (BENICAR) 5 MG tablet Take 2 tablets (10 mg total) by mouth daily.   Omega-3 Fatty Acids (OMEGA 3 500 PO) Take 500 mg by mouth daily.   OneTouch Delica Lancets 61W MISC Use to check blood sugar 2-3 times a day.   pantoprazole (PROTONIX) 40 MG tablet Take 1 tablet (40 mg total) by mouth daily.   predniSONE (DELTASONE) 20 MG tablet Take  3 tablets (60 mg total) by mouth daily. Start on 03/04/21   risperiDONE (RISPERDAL) 0.5 MG tablet Take 1 tablet (0.5 mg total) by mouth 2 (two) times daily.   rivaroxaban (XARELTO) 20 MG TABS tablet Take 1 tablet (20 mg total) by mouth at bedtime.   Saccharomyces boulardii (PROBIOTIC) 250 MG CAPS Take 1 capsule by mouth in the morning and at bedtime.   sharps container 1 each by Does not apply route as needed.   Skin Protectants, Misc. (EUCERIN) cream Apply topically as needed for dry skin.    SYMBICORT 80-4.5 MCG/ACT inhaler INHALE 2 PUFFS BY MOUTH TWICE DAILY   triamcinolone cream (KENALOG) 0.1 % APPLY TO AFFECTED AREAS TWICE DAILY   vitamin B-12 (CYANOCOBALAMIN) 1000 MCG tablet Take 1 tablet (1,000 mcg total) by mouth daily.   No facility-administered encounter medications on file as of 06/14/2021.    Patient Active Problem List   Diagnosis Date Noted   Delusional disorder ([place]) 03/03/2021   Other thrombophilia ([place]) 12/25/2020   Vitamin D deficiency 12/04/2020   Vitamin B12 deficiency 12/04/2020   Stenosis of right carotid artery 12/03/2020   History of CVA (cerebrovascular accident) 11/06/2020   History of 2019 novel coronavirus disease (COVID-19) 09/27/2020   Atherosclerosis of aorta ([place]) 12/04/2019   Persistent atrial fibrillation ([place]) 09/12/2019   CAD (coronary artery disease) 09/12/2019   Chronic venous stasis 09/12/2019   Hoarding disorder 09/12/2019   Cervical spinal stenosis 09/12/2019   Diabetic retinopathy ([place]) 09/12/2019   COPD, mild ([place]) 09/12/2019   Osteoporosis 09/09/2019   History of prostate cancer 09/09/2019   Anxiety 08/10/2019   Obstructive sleep apnea on CPAP 08/10/2019   Hyperlipidemia associated with type 2 diabetes mellitus ([place]) 08/10/2019   Chronic diastolic CHF (congestive heart failure) ([place]) 01/09/2014   Esophageal dysmotility 09/12/2013   Type 2 diabetes mellitus with morbid obesity ([place]) 02/14/2012   Obesity, morbid (more than 100 lbs over ideal weight or BMI > 40) ([place]) 07/26/2011   OLD MYOCARDIAL INFARCTION 03/02/2010   Hypertension associated with diabetes ([place]) 03/20/2009   CORONARY ATHEROSCLEROSIS, ARTERY BYPASS GRAFT 03/20/2009    Conditions to be addressed/monitored: Anxiety and Delusional Disorder ; Inability to perform ADL's independently and Inability to perform IADL's independently  Care Plan : General Social Work (Adult)  Updates made by Evan [name], Evan [name] since 06/17/2021 12:00 AM     Problem: Response to  Treatment (Depression)      Goal: Response to Treatment Maximized   Start Date: 12/27/2020  This Visit's Progress: On track  Recent Progress: On track  Priority: High  Note:   Current barriers:   Acute Mental Health needs related to Anxiety Housing barriers, Level of care concerns, and Mental Health Concerns  Needs Support, Education, and Care Coordination in order to meet unmet mental health needs. Clinical Goal(s): Over the next 120 days, patient will work with SW to reduce or manage symptoms of anxiety and increase knowledge and/or ability of: coping skills, healthy habits, self-management skills, and stress reduction.until connected for ongoing counseling.  Clinical Interventions:  Assessed patient's previous treatment, needs, coping skills, current treatment, support system and barriers to care Patient interviewed and appropriate assessments performed Provided mental health counseling with regard to managing mental health conditions Patient reports difficulty managing symptoms of anxiety triggered by chronic health conditions Patient is upset about the way he is treated by multiple personal aids, stating that current aid is dishonest about why she does not show to work. CCM Evan [name] strongly encouraged patient  to contact agency about his concerns to reach a resolution 09/08: Evan [name] is back as his guide for 27 hrs a week [place] [phone] 09/26: Patient informed CCM Evan [name] that Evan [name] will provide transportation for his upcoming PCP appt scheduled 05/11/21. This information was verified by Evan [name], pt's new aid that will be assisting with ADL's in the home on Mondays, Wednesdays, and Fridays 11/1: Patient reports that he is no longer receiving services from [facility]. He has contacted Evan [name] and is awaiting paperwork regarding home care agencies 11/8: Patient reports inability to work with Evan [name] and her home aids. He has contacted [facility] and requested to work with a new  agency. Patient reports he has met with a new aid and will have an assessment completed within a week Patient receives visits from neighbors which promotes mood CCM Evan [name] discussed CBT strategies to assist with managing symptoms  Patient has scheduled transportation assistance through [employer] to run errands for next week. He is proud of this noting that he does not want to continue using aid for transportation needs Validation and encouragement was provided during call. CCM Evan [name] encouraged patient to continue reflecting on past experiences and current friendships to promote positive mood and stress management. States he has enjoyed playing cards online with friends and praying to cope with stressors. 10/10 Patient shared that recent stress has been alleviated with drawing and talking with family and friends  CCM Evan [name] provided a supportive environment to allow patient to process his emotions 10/10: Patient reports compliance with medications. Patient utilizes medicine cabinet with labels, AM and PM, to assist with medication management Patient reports that his strong spiritual relationship keeps him encouraged to move forward instead of focusing on past events 11/1: Patient enjoys walking to assist with stress management Patient reports becoming "stir crazy" from staying at home for majority of the day. CCM Evan [name] discussed benefits of participating in [facility]. Patient reports hx of attending [facility]. States that he disenrolled because staff and participants believed he was "delusional" because he has close relationships with various celebrities. Patient is not interested in initiating psychiatry and/or counseling to assist with strengthening support system 09/08: Patient reports that he participates in a group weekly to promote socialization with others CCM Evan [name] discussed strategies to improve communication skills with aid to assist in preventing misunderstandings or offending others  CCM Evan [name] reviewed  upcoming appointments with patient. He reports that he is utilizing [employer] for transportation. His aid has offered to bring patient to local stores to obtain cleaning supplies. Patient reports that he is aware that he can also utilize [employer] for transportation to medical appointments 10/10: Patient has a ride scheduled for 10/18 to take him grocery shopping CCM Evan [name] discussed strategies to assist patient with memory concerns. Patient agreed to write notes and post them in high traffic areas of the home to remind him of upcoming appts and/or medications CCM Evan [name] collaborated with PCP and CCM RN regarding concerns of medication adherence. Evan [name] informed RNCM of patient's request for a f/up call regarding questions about his cholesterol levels and strategies to manage condition Other interventions: Solution-Focused Strategies, Mindfulness or Relaxation Training, Active listening / Reflection utilized , Emotional Supportive Provided, Psychoeducation for mental health needs , Motivational Interviewing, Participation in counseling encouraged , Participation in support group encouraged , Consideration of in-home help encouraged , and Verbalization of feelings encouraged   Discussed plans with patient for ongoing care management follow up and provided patient with direct contact information for  care management team Collaboration with PCP regarding development and update of comprehensive plan of care as evidenced by provider attestation and co-signature Inter-disciplinary care team collaboration ([name] longitudinal plan of care) Patient Goals/Self-Care Activities: Over the next 120 days Attend all scheduled appointments with providers Contact office with any questions or concerns [facility] transportation number to assist with attending medical appointments Practice relaxation or meditation daily Practice positive thinking and self-talk      Evan [name], Evan [name], [email or web address] Phone [phone] 8:20 AM

## 2021-06-23 ENCOUNTER — Telehealth: Payer: Self-pay | Admitting: Cardiovascular Disease

## 2021-06-23 ENCOUNTER — Ambulatory Visit: Payer: Medicare Other | Admitting: Licensed Clinical Social Worker

## 2021-06-23 DIAGNOSIS — I152 Hypertension secondary to endocrine disorders: Secondary | ICD-10-CM

## 2021-06-23 DIAGNOSIS — E1159 Type 2 diabetes mellitus with other circulatory complications: Secondary | ICD-10-CM

## 2021-06-23 DIAGNOSIS — F22 Delusional disorders: Secondary | ICD-10-CM

## 2021-06-23 DIAGNOSIS — E1169 Type 2 diabetes mellitus with other specified complication: Secondary | ICD-10-CM

## 2021-06-23 DIAGNOSIS — J449 Chronic obstructive pulmonary disease, unspecified: Secondary | ICD-10-CM

## 2021-06-23 DIAGNOSIS — F419 Anxiety disorder, unspecified: Secondary | ICD-10-CM

## 2021-06-23 DIAGNOSIS — I5032 Chronic diastolic (congestive) heart failure: Secondary | ICD-10-CM

## 2021-06-23 NOTE — Telephone Encounter (Signed)
  Patient Consent for Virtual Visit        [place] has provided verbal consent on 06/23/2021 for a virtual visit (video or telephone).   CONSENT FOR VIRTUAL VISIT FOR:  [place]  By participating in this virtual visit I agree to the following:  I hereby voluntarily request, consent and authorize [facility] and its employed or contracted physicians, physician assistants, nurse practitioners or other licensed health care professionals (the Practitioner), to provide me with telemedicine health care services (the "Services") as deemed necessary by the treating Practitioner. I acknowledge and consent to receive the Services by the Practitioner via telemedicine. I understand that the telemedicine visit will involve communicating with the Practitioner through live audiovisual communication technology and the disclosure of certain medical information by electronic transmission. I acknowledge that I have been given the opportunity to request an in-person assessment or other available alternative prior to the telemedicine visit and am voluntarily participating in the telemedicine visit.  I understand that I have the right to withhold or withdraw my consent to the use of telemedicine in the course of my care at any time, without affecting my right to future care or treatment, and that the Practitioner or I may terminate the telemedicine visit at any time. I understand that I have the right to inspect all information obtained and/or recorded in the course of the telemedicine visit and may receive copies of available information for a reasonable fee.  I understand that some of the potential risks of receiving the Services via telemedicine include:  Delay or interruption in medical evaluation due to technological equipment failure or disruption; Information transmitted may not be sufficient (e.g. poor resolution of images) to allow for appropriate medical decision making by the Practitioner;  and/or  In rare instances, security protocols could fail, causing a breach of personal health information.  Furthermore, I acknowledge that it is my responsibility to provide information about my medical history, conditions and care that is complete and accurate to the best of my ability. I acknowledge that Practitioner's advice, recommendations, and/or decision may be based on factors not within their control, such as incomplete or inaccurate data provided by me or distortions of diagnostic images or specimens that may result from electronic transmissions. I understand that the practice of medicine is not an exact science and that Practitioner makes no warranties or guarantees regarding treatment outcomes. I acknowledge that a copy of this consent can be made available to me via my patient portal ([facility]), or I can request a printed copy by calling the office of [facility].    I understand that my insurance will be billed for this visit.   I have read or had this consent read to me. I understand the contents of this consent, which adequately explains the benefits and risks of the Services being provided via telemedicine.  I have been provided ample opportunity to ask questions regarding this consent and the Services and have had my questions answered to my satisfaction. I give my informed consent for the services to be provided through the use of telemedicine in my medical care

## 2021-06-24 ENCOUNTER — Telehealth: Payer: Self-pay | Admitting: Nurse Practitioner

## 2021-06-24 NOTE — Telephone Encounter (Signed)
Pt called stating that he is needing to have a new CPAP machine. He states that his old one has broken. He states that it is leaking and not letting air out. Please advise.

## 2021-06-27 NOTE — Progress Notes (Signed)
 Virtual Visit via Telephone Note   This visit type was conducted due to national recommendations for restrictions regarding the COVID-19 Pandemic (e.g. social distancing) in an effort to limit this patient's exposure and mitigate transmission in our community.  Due to his co-morbid illnesses, this patient is at least at moderate risk for complications without adequate follow up.  This format is felt to be most appropriate for this patient at this time.  The patient did not have access to video technology/had technical difficulties with video requiring transitioning to audio format only (telephone).  All issues noted in this document were discussed and addressed.  No physical exam could be performed with this format.  Please refer to the patient's chart for his  consent to telehealth for [facility].    Date:  06/28/2021   ID:  Evan [name], DOB 05/15/1956, MRN [id number] The patient was identified using 2 identifiers.  Patient Location: Home Provider Location: Office/Clinic   PCP:  [name], [name] T, NP   [facility] Providers Cardiologist:  Timothy Gollan, MD Cardiology APP:  Hackney, Tina A, FNP {  Evaluation Performed:  Follow-Up Visit  Chief Complaint: ED follow-up  History of Present Illness:    Evan [name] is a 65 y.o. male with CAD status post 6-vessel CABG in 2009 with numerous cardiac stents prior dating back to early 2000, HFpEF, permanent A. fib, CVA noted on MRI in 11/2020, DM2 with diabetic retinopathy, HTN, HLD, morbid obesity, OSA on CPAP, anxiety, and obsessive-compulsive disorder who presents for ED follow-up of palpitations and chest discomfort.  He was previously followed by Dr. Paraschos, though established with Dr. Gollan in 09/2019.  Frequent ED visits for varying issues.  Please see EMR for details.  He underwent 6 vessel CABG in 2009 with LIMA to LAD, SVG to diagonal, SVG to OM, SVG to ramus, SVG to OM 2, and SVG to distal RCA.  It does not  appear he has required ischemic evaluation since his CABG in 2009.  Most recent echo in 11/2020 demonstrated a low normal LV systolic function with an EF of 50 to 55%, septal wall hypokinesis possibly due to bundle branch block, mild LVH, indeterminate LV diastolic function parameters, normal RV systolic function and ventricular cavity size, mildly dilated left atrium, mild mitral regurgitation, moderate calcification of the aortic valve with mild insufficiency and moderate stenosis with a mean gradient of 21.5 mmHg.  He was last seen in the office in 01/2021 with numerous complaints and stable cardiac status.  He was more recently evaluated in the ED in late 05/2021 with chest discomfort with high-sensitivity troponin being negative x2.  Chest x-ray showed no acute cardiopulmonary process.  EKG demonstrated A. fib with PVCs and no acute ST-T changes.  He was discharged to outpatient follow-up.  He was most recently seen in the ED on 06/08/2021 for palpitations.  High-sensitivity troponin negative x1.  EKG showed rate controlled A. fib with rare PVC.  Chest x-ray again showed no evidence of acute cardiopulmonary process.  He was discharged to outpatient follow-up.  He is contacted virtually today and is doing well from a cardiac perspective.  No further chest pain or palpitations.  He indicates both these occurred in stressful situations.  They did not feel like his prior angina.  With improved stress, symptoms have resolved.  He reports adherence and tolerance to all medications.  He does not have any active cardiac issues or concerns at this time.   Labs independently reviewed: 06/2021 -   potassium 4.3, BUN 24, serum creatinine 0.70, BUN 3.6, AST/ALT normal, Hgb 14.4, PLT 237 05/2021 - TC 275, TG 96, HDL 57, LDL 202, A1c 6.2 11/2020 - TSH normal  The patient does not have symptoms concerning for COVID-19 infection (fever, chills, cough, or new shortness of breath).    Past Medical History:  Diagnosis  Date   Anginal pain (HCC)    Anxiety disorder    Asthma    Atresia of esophagus without fistula    CAD (coronary artery disease)    Cellulitis    CHF (congestive heart failure) (HCC)    NYHA CLASS III,CHRONIC,DIASTOLIC   COPD (chronic obstructive pulmonary disease) (HCC)    COVID-19    Diabetes mellitus without complication (HCC)    Edema    RIGHT LOWER LEG   Gastroesophageal reflux    H/O: GI bleed    History of pneumonia    Remote   History of scarlet fever    Childhood   Hyperlipidemia    Hypertension    Myocardial infarction (HCC) 2009   Obesity    Obstructive sleep apnea    Pain    CHRONIC BACK / ABDOMINAL   Panic disorder    Peripheral venous insufficiency    PTSD (post-traumatic stress disorder)    Retinopathy    DIABETIC   Stasis, venous    Stroke (HCC)    Vertigo    Past Surgical History:  Procedure Laterality Date   CARDIAC CATHETERIZATION     CATARACT EXTRACTION Left    CATARACT EXTRACTION W/PHACO Right 05/03/2017   Procedure: CATARACT EXTRACTION PHACO AND INTRAOCULAR LENS PLACEMENT (IOC);  Surgeon: Brasington, Chadwick, MD;  Location: [facility] OR[facility];  Service: Ophthalmology;  Laterality: Right;  US 00:35.3 AP% 12.3 CDE 4.33 Fluid Pack lot # [id number]        COLONOSCOPY WITH PROPOFOL N/A 11/26/2020   Procedure: COLONOSCOPY WITH PROPOFOL;  Surgeon: Anna, Kiran, MD;  Location: [facility];  Service: Gastroenterology;  Laterality: N/A;  ANNETTE TO PICK UP [phone]   CORONARY ANGIOPLASTY WITH STENT PLACEMENT  2002   CORONARY ANGIOPLASTY WITH STENT PLACEMENT  1999   CORONARY ARTERY BYPASS GRAFT     x7   ESOPHAGOGASTRODUODENOSCOPY N/A 09/19/2016   Procedure: ESOPHAGOGASTRODUODENOSCOPY (EGD);  Surgeon: Martin U Skulskie, MD;  Location: [facility] END[facility];  Service: Endoscopy;  Laterality: N/A;   ESOPHAGOGASTRODUODENOSCOPY (EGD) WITH PROPOFOL  11/26/2020   Procedure: ESOPHAGOGASTRODUODENOSCOPY (EGD) WITH PROPOFOL;  Surgeon: Anna, Kiran, MD;  Location: [facility]  ENDOSCOPY;  Service: Gastroenterology;;     Current Meds  Medication Sig   acetaminophen (TYLENOL) 500 MG tablet Take 500 mg by mouth every 6 (six) hours as needed.   albuterol (VENTOLIN HFA) 108 (90 Base) MCG/ACT inhaler Inhale 2 puffs into the lungs every 4 (four) hours as needed for wheezing or shortness of breath.   aspirin 81 MG chewable tablet Chew 1 tablet (81 mg total) by mouth daily.   atorvastatin (LIPITOR) 80 MG tablet Take 1 tablet (80 mg total) by mouth daily.   Blood Glucose Monitoring Suppl (ONETOUCH VERIO) w/Device KIT Use to check blood sugar 3 to 4 times a day and document.  Please bring to visits for review.   Cholecalciferol 125 MCG (5000 UT) TABS Take 1 tablet (5,000 Units total) by mouth daily.   Elastic Bandages & Supports (MEDICAL COMPRESSION STOCKINGS) MISC 1 Package by Does not apply route daily.   empagliflozin (JARDIANCE) 10 MG TABS tablet Take 1 tablet (10 mg total) by mouth daily.   ezetimibe (  ZETIA) 10 MG tablet Take 1 tablet (10 mg total) by mouth daily.   furosemide (LASIX) 40 MG tablet TAKE 2 TABLETS BY MOUTH ONCE EVERY MORNING AND 1 TABLET ONCE EVERY EVENING   glucose blood (ONETOUCH VERIO) test strip Use to check blood sugar 3 to 4 times a day.   metoprolol succinate (TOPROL XL) 50 MG 24 hr tablet Take 1 tablet (50 mg total) by mouth daily.   nitroGLYCERIN (NITROSTAT) 0.4 MG SL tablet Place 1 tablet (0.4 mg total) under the tongue every 5 (five) minutes as needed for chest pain.   nystatin (MYCOSTATIN/NYSTOP) powder Apply 1 application topically 3 (three) times daily.   olmesartan (BENICAR) 5 MG tablet Take 2 tablets (10 mg total) by mouth daily.   Omega-3 Fatty Acids (OMEGA 3 500 PO) Take 500 mg by mouth daily.   OneTouch Delica Lancets 33G MISC Use to check blood sugar 2-3 times a day.   pantoprazole (PROTONIX) 40 MG tablet Take 1 tablet (40 mg total) by mouth daily.   predniSONE (DELTASONE) 20 MG tablet Take 3 tablets (60 mg total) by mouth daily. Start  on 03/04/21   risperiDONE (RISPERDAL) 0.5 MG tablet Take 1 tablet (0.5 mg total) by mouth 2 (two) times daily.   rivaroxaban (XARELTO) 20 MG TABS tablet Take 1 tablet (20 mg total) by mouth at bedtime.   Saccharomyces boulardii (PROBIOTIC) 250 MG CAPS Take 1 capsule by mouth in the morning and at bedtime.   sharps container 1 each by Does not apply route as needed.   Skin Protectants, Misc. (EUCERIN) cream Apply topically as needed for dry skin.   SYMBICORT 80-4.5 MCG/ACT inhaler INHALE 2 PUFFS BY MOUTH TWICE DAILY   triamcinolone cream (KENALOG) 0.1 % APPLY TO AFFECTED AREAS TWICE DAILY   vitamin B-12 (CYANOCOBALAMIN) 1000 MCG tablet Take 1 tablet (1,000 mcg total) by mouth daily.     Allergies:   Penicillin g, Sulfa antibiotics, Tiotropium, and Zoloft [sertraline hcl]   Social History   Tobacco Use   Smoking status: Never   Smokeless tobacco: Never  Vaping Use   Vaping Use: Never used  Substance Use Topics   Alcohol use: No   Drug use: No     Family Hx: The patient's family history includes Coronary artery disease in an other family member; Heart attack in his mother; Heart attack (age of onset: 38) in his brother; Hyperlipidemia in his mother; Hypertension in his mother.  ROS:   Please see the history of present illness.     All other systems reviewed and are negative.   Prior CV studies:   The following studies were reviewed today:  2D echo 11/06/2020:  1. Left ventricular ejection fraction, by estimation, is 50 to 55%. The  left ventricle has low normal function. Septal wall hypokinesis possibly  from bundle branch block. There is mild left ventricular hypertrophy. Left  ventricular diastolic parameters  are indeterminate.   2. Right ventricular systolic function is normal. The right ventricular  size is normal.   3. Left atrial size was mildly dilated.   4. Mild mitral valve regurgitation.   5. The aortic valve was not well visualized. There is moderate   calcification of the aortic valve. Aortic valve regurgitation is mild.  Moderate aortic valve stenosis by mean gradient measures 21.5 mmHg and  aortic valve Vmax measures 3.21 m/s.   6. Poor visulalization  __________  2D echo 08/28/2019: 1. Left ventricular ejection fraction, by visual estimation, is 50 to    55%. The left ventricle has low normal function. There is no left  ventricular hypertrophy.   2. Mildly dilated left ventricular internal cavity size.   3. The left ventricle has no regional wall motion abnormalities.   4. Global right ventricle has normal systolic function.The right  ventricular size is normal. No increase in right ventricular wall  thickness.   5. Left atrial size was normal.   6. Right atrial size was normal.   7. The mitral valve is normal in structure. Trivial mitral valve  regurgitation.   8. The tricuspid valve is normal in structure.   9. The tricuspid valve is normal in structure. Tricuspid valve  regurgitation is trivial.  10. The aortic valve is normal in structure. Aortic valve regurgitation is  not visualized.  11. The pulmonic valve was grossly normal. Pulmonic valve regurgitation is  not visualized.  12. Normal pulmonary artery systolic pressure.  Labs/Other Tests and Data Reviewed:    EKG:   As above  Recent Labs: 11/06/2020: Magnesium 2.3; TSH 2.567 03/03/2021: B Natriuretic Peptide 169.5 06/08/2021: ALT 20; BUN 24; Creatinine, Ser 0.78; Hemoglobin 14.4; Platelets 237; Potassium 4.3; Sodium 138   Recent Lipid Panel Lab Results  Component Value Date/Time   CHOL 275 (H) 05/11/2021 11:15 AM   CHOL 194 12/30/2013 04:13 AM   TRIG 96 05/11/2021 11:15 AM   TRIG 158 12/30/2013 04:13 AM   HDL 57 05/11/2021 11:15 AM   HDL 42 12/30/2013 04:13 AM   CHOLHDL 4.5 11/07/2020 04:37 AM   LDLCALC 202 (H) 05/11/2021 11:15 AM   LDLCALC 120 (H) 12/30/2013 04:13 AM   LDLDIRECT 178.5 07/26/2011 12:34 PM    Wt Readings from Last 3 Encounters:  06/28/21  283 lb (128.4 kg)  06/08/21 283 lb (128.4 kg)  05/11/21 211 lb 12.8 oz (96.1 kg)     Risk Assessment/Calculations:    CHA2DS2-VASc Score = 5  This indicates a 7.2% annual risk of stroke. The patient's score is based upon: CHF History: 1 HTN History: 1 Diabetes History: 1 Stroke History: 0 Vascular Disease History: 1 Age Score: 1 Gender Score: 0      Objective:    Vital Signs:  Ht 5' 5" (1.651 m)   Wt 283 lb (128.4 kg)   BMI 47.09 kg/m      ASSESSMENT & PLAN:    CAD status post CABG without angina: He appears to be doing well without symptoms of chest pain.  Recent high-sensitivity troponins normal.  Continue aspirin, Toprol-XL, Zetia, and atorvastatin.  If he develops symptoms concerning for angina, could consider Lexiscan MPI.  Permanent A. fib/palpitations: No further palpitations.  By his report he appears to be rate controlled with Toprol-XL.  Given a CHA2DS2-VASc of 5 he remains on Xarelto without any symptoms concerning for bleeding and stable CBC as outlined above.  HFpEF: By his report he appears euvolemic.  He remains on furosemide with stable renal function as outlined above.  HTN: No recent blood pressure for review.  Reports adherence to olmesartan, Toprol, and Lasix.  Low sodium diet encouraged.   HLD: LDL 202 in 05/2021 with goal LDL being less than 70.  Reports compliance with Lipitor 80 mg and Zetia 10 mg.  Recommend recheck in follow up.  Anxiety/panic attacks/delusions: Appears to be his primary issue at this time.  Follow up with PCP.     Time:   Today, I have spent 20 minutes with the patient with telehealth technology discussing the above problems.       Medication Adjustments/Labs and Tests Ordered: Current medicines are reviewed at length with the patient today.  Concerns regarding medicines are outlined above.   Tests Ordered: No orders of the defined types were placed in this encounter.   Medication Changes: No orders of the defined types  were placed in this encounter.   Follow Up:  In Person in 6 month(s) with primary cardiologist.   Signed,  , PA-C  06/28/2021 11:17 AM    [facility]  

## 2021-06-27 NOTE — Chronic Care Management (AMB) (Signed)
Chronic Care Management    Clinical Social Work Note  06/27/2021 Name: Evan [name] MRN: [id number] DOB: 1956/06/26  Evan [name] is a 65 y.o. year old male who is a primary care patient of [name], [name], NP. The CCM team was consulted to assist the patient with chronic disease management and/or care coordination needs related to: Mental Health Counseling and Resources.   Engaged with patient by telephone for follow up visit in response to provider referral for social work chronic care management and care coordination services.   Consent to Services:  The patient was given information about Chronic Care Management services, agreed to services, and gave verbal consent prior to initiation of services.  Please see initial visit note for detailed documentation.   Patient agreed to services and consent obtained.   Consent to Services:  The patient was given information about Care Management services, agreed to services, and gave verbal consent prior to initiation of services.  Please see initial visit note for detailed documentation.   Patient agreed to services today and consent obtained.  Engaged with patient by phone in response to provider referral for social work care coordination services:  Assessment/Interventions:  Patient continues to maintain positive progress with care plan goals. Patient will receive support from leasing agent, [name], with housing inspection. Patient is awaiting assessment from a new in-home agency for [place]. See Care Plan below for interventions and patient self-care actives.  Recent life changes or stressors: Housing-Difficulty meeting inspection regulations  Recommendation: Patient may benefit from, and is in agreement work with LCSW to address care coordination needs and will continue to work with the clinical team to address health care and disease management related needs.   Follow up Plan: Patient would like continued follow-up  from CCM LCS .  per patient's request will follow up in 07/04/21.  Will call office if needed prior to next encounter.  SDOH (Social Determinants of Health) assessments and interventions performed:    Advanced Directives Status: Not addressed in this encounter.  CCM Care Plan  Allergies  Allergen Reactions   Penicillin G Hives   Sulfa Antibiotics Hives   Tiotropium    Zoloft [Sertraline Hcl] Other (See Comments)    Outpatient Encounter Medications as of 06/23/2021  Medication Sig   acetaminophen (TYLENOL) 500 MG tablet Take 500 mg by mouth every 6 (six) hours as needed.   albuterol (VENTOLIN HFA) 108 (90 Base) MCG/ACT inhaler Inhale 2 puffs into the lungs every 4 (four) hours as needed for wheezing or shortness of breath.   aspirin 81 MG chewable tablet Chew 1 tablet (81 mg total) by mouth daily.   atorvastatin (LIPITOR) 80 MG tablet Take 1 tablet (80 mg total) by mouth daily.   Blood Glucose Monitoring Suppl (ONETOUCH VERIO) w/Device KIT Use to check blood sugar 3 to 4 times a day and document.  Please bring to visits for review.   Cholecalciferol 125 MCG (5000 UT) TABS Take 1 tablet (5,000 Units total) by mouth daily.   Elastic Bandages & Supports (MEDICAL COMPRESSION STOCKINGS) MISC 1 Package by Does not apply route daily.   empagliflozin (JARDIANCE) 10 MG TABS tablet Take 1 tablet (10 mg total) by mouth daily.   ezetimibe (ZETIA) 10 MG tablet Take 1 tablet (10 mg total) by mouth daily.   furosemide (LASIX) 40 MG tablet TAKE 2 TABLETS BY MOUTH ONCE EVERY MORNING AND 1 TABLET ONCE EVERY EVENING   glucose blood (ONETOUCH VERIO) test strip Use to check  blood sugar 3 to 4 times a day.   metoprolol succinate (TOPROL XL) 50 MG 24 hr tablet Take 1 tablet (50 mg total) by mouth daily.   MODERNA COVID-19 VACCINE 100 MCG/0.5ML injection    nitroGLYCERIN (NITROSTAT) 0.4 MG SL tablet Place 1 tablet (0.4 mg total) under the tongue every 5 (five) minutes as needed for chest pain.   nystatin  (MYCOSTATIN/NYSTOP) powder Apply 1 application topically 3 (three) times daily.   olmesartan (BENICAR) 5 MG tablet Take 2 tablets (10 mg total) by mouth daily.   Omega-3 Fatty Acids (OMEGA 3 500 PO) Take 500 mg by mouth daily.   OneTouch Delica Lancets 79Y MISC Use to check blood sugar 2-3 times a day.   pantoprazole (PROTONIX) 40 MG tablet Take 1 tablet (40 mg total) by mouth daily.   predniSONE (DELTASONE) 20 MG tablet Take 3 tablets (60 mg total) by mouth daily. Start on 03/04/21   risperiDONE (RISPERDAL) 0.5 MG tablet Take 1 tablet (0.5 mg total) by mouth 2 (two) times daily.   rivaroxaban (XARELTO) 20 MG TABS tablet Take 1 tablet (20 mg total) by mouth at bedtime.   Saccharomyces boulardii (PROBIOTIC) 250 MG CAPS Take 1 capsule by mouth in the morning and at bedtime.   sharps container 1 each by Does not apply route as needed.   Skin Protectants, Misc. (EUCERIN) cream Apply topically as needed for dry skin.   SYMBICORT 80-4.5 MCG/ACT inhaler INHALE 2 PUFFS BY MOUTH TWICE DAILY   triamcinolone cream (KENALOG) 0.1 % APPLY TO AFFECTED AREAS TWICE DAILY   vitamin B-12 (CYANOCOBALAMIN) 1000 MCG tablet Take 1 tablet (1,000 mcg total) by mouth daily.   No facility-administered encounter medications on file as of 06/23/2021.    Patient Active Problem List   Diagnosis Date Noted   Delusional disorder ([place]) 03/03/2021   Other thrombophilia ([place]) 12/25/2020   Vitamin D deficiency 12/04/2020   Vitamin B12 deficiency 12/04/2020   Stenosis of right carotid artery 12/03/2020   History of CVA (cerebrovascular accident) 11/06/2020   History of 2019 novel coronavirus disease (COVID-19) 09/27/2020   Atherosclerosis of aorta ([place]) 12/04/2019   Persistent atrial fibrillation ([place]) 09/12/2019   CAD (coronary artery disease) 09/12/2019   Chronic venous stasis 09/12/2019   Hoarding disorder 09/12/2019   Cervical spinal stenosis 09/12/2019   Diabetic retinopathy ([place]) 09/12/2019   COPD, mild ([place])  09/12/2019   Osteoporosis 09/09/2019   History of prostate cancer 09/09/2019   Anxiety 08/10/2019   Obstructive sleep apnea on CPAP 08/10/2019   Hyperlipidemia associated with type 2 diabetes mellitus ([place]) 08/10/2019   Chronic diastolic CHF (congestive heart failure) ([place]) 01/09/2014   Esophageal dysmotility 09/12/2013   Type 2 diabetes mellitus with morbid obesity ([place]) 02/14/2012   Obesity, morbid (more than 100 lbs over ideal weight or BMI > 40) ([place]) 07/26/2011   OLD MYOCARDIAL INFARCTION 03/02/2010   Hypertension associated with diabetes ([place]) 03/20/2009   CORONARY ATHEROSCLEROSIS, ARTERY BYPASS GRAFT 03/20/2009    Conditions to be addressed/monitored: COPD, DMII, Anxiety, and Delusional Disorder ; Limited social support, Housing barriers, and Mental Health Concerns   Care Plan : General Social Work (Adult)  Updates made by [name] D, LCSW since 06/27/2021 12:00 AM     Problem: Response to Treatment (Depression)      Goal: Response to Treatment Maximized   Start Date: 12/27/2020  This Visit's Progress: On track  Recent Progress: On track  Priority: High  Note:   Current barriers:   Acute Mental  Health needs related to Anxiety Housing barriers, Level of care concerns, and Mental Health Concerns  Needs Support, Education, and Care Coordination in order to meet unmet mental health needs. Clinical Goal(s): Over the next 120 days, patient will work with SW to reduce or manage symptoms of anxiety and increase knowledge and/or ability of: coping skills, healthy habits, self-management skills, and stress reduction.until connected for ongoing counseling.  Clinical Interventions:  Assessed patient's previous treatment, needs, coping skills, current treatment, support system and barriers to care Patient interviewed and appropriate assessments performed Provided mental health counseling with regard to managing mental health conditions Patient reports difficulty managing  symptoms of anxiety triggered by chronic health conditions Concerns that rent isn't paid for. Concerns for not passing inspection and it needed to be completed 06/23/21 [name] is his leasing agent who contacted patient while CCM LCSW was on phone and shared that she will work with patient on meeting inspection. Scheduled to visit home on Tuesday, 06/28/21. Send info with contact numbers Patient is upset about the way he is treated by multiple personal aids, stating that current aid is dishonest about why she does not show to work. CCM LCSW strongly encouraged patient to contact agency about his concerns to reach a resolution 09/08: [name] is back as his guide for 27 hrs a week [place] [phone] 09/26: Patient informed CCM LCSW that [name] will provide transportation for his upcoming PCP appt scheduled 05/11/21. This information was verified by [name], pt's new aid that will be assisting with ADL's in the home on Mondays, Wednesdays, and Fridays 11/1: Patient reports that he is no longer receiving services from [facility]. He has contacted [name] and is awaiting paperwork regarding home care agencies 11/8: Patient reports inability to work with [name] and her home aids. He has contacted [place] and requested to work with a new agency. Patient reports he has met with a new aid and will have an assessment completed within a week Patient receives visits from neighbors which promotes mood CCM LCSW discussed CBT strategies to assist with managing symptoms  Patient has scheduled transportation assistance through [place] to run errands for next week. He is proud of this noting that he does not want to continue using aid for transportation needs Validation and encouragement was provided during call. CCM LCSW encouraged patient to continue reflecting on past experiences and current friendships to promote positive mood and stress management. States he has enjoyed playing cards online  with friends and praying to cope with stressors. 10/10 Patient shared that recent stress has been alleviated with drawing and talking with family and friends  CCM LCSW provided a supportive environment to allow patient to process his emotions Patient reports compliance with medications. Patient utilizes medicine cabinet with labels, AM and PM, to assist with medication management Patient reports that his strong spiritual relationship keeps him encouraged to move forward instead of focusing on past events 11/1: Patient enjoys walking to assist with stress management Patient reports becoming "stir crazy" from staying at home for majority of the day. CCM LCSW discussed benefits of participating in [facility]. Patient reports hx of attending [facility]. States that he disenrolled because staff and participants believed he was "delusional" because he has close relationships with various celebrities. Patient is not interested in initiating psychiatry and/or counseling to assist with strengthening support system 09/08: Patient reports that he participates in a group weekly to promote socialization with others CCM LCSW discussed strategies to improve communication skills with aid to assist  in preventing misunderstandings or offending others  CCM LCSW reviewed upcoming appointments with patient. He reports that he is utilizing [employer] for transportation. His aid has offered to bring patient to local stores to obtain cleaning supplies. Patient reports that he is aware that he can also utilize [employer] for transportation to medical appointments 10/10: Patient has a ride scheduled for 10/18 to take him grocery shopping CCM LCSW discussed strategies to assist patient with memory concerns. Patient agreed to write notes and post them in high traffic areas of the home to remind him of upcoming appts and/or medications CCM LCSW collaborated with PCP and CCM RN regarding concerns of medication adherence. LCSW informed RNCM of  patient's request for a f/up call regarding questions about his cholesterol levels and strategies to manage condition Other interventions: Solution-Focused Strategies, Mindfulness or Relaxation Training, Active listening / Reflection utilized , Emotional Supportive Provided, Psychoeducation for mental health needs , Motivational Interviewing, Participation in counseling encouraged , Participation in support group encouraged , Consideration of in-home help encouraged , and Verbalization of feelings encouraged   Discussed plans with patient for ongoing care management follow up and provided patient with direct contact information for care management team Collaboration with PCP regarding development and update of comprehensive plan of care as evidenced by provider attestation and co-signature Inter-disciplinary care team collaboration (see longitudinal plan of care) Patient Goals/Self-Care Activities: Over the next 120 days Attend all scheduled appointments with providers Contact office with any questions or concerns Utilize [facility] transportation number to assist with attending medical appointments Practice relaxation or meditation daily Practice positive thinking and self-talk      [name], MSW, [email or web address] Phone [phone] 4:39 AM

## 2021-06-27 NOTE — Patient Instructions (Signed)
Visit Information  Thank you for taking time to visit with me today. Please don't hesitate to contact me if I can be of assistance to you before our next scheduled telephone appointment.  Following are the goals we discussed today:  Patient Goals/Self-Care Activities: Over the next 120 days Attend all scheduled appointments with providers Contact office with any questions or concerns Utilize [facility] transportation number to assist with attending medical appointments Practice relaxation or meditation daily Practice positive thinking and self-talk  Our next appointment is by telephone on 07/04/21 at 9:00 AM  Please call the care guide team at [phone] if you need to cancel or reschedule your appointment.   Please call 911 if you are experiencing a Mental Health or [place] or need someone to talk to.  Patient verbalizes understanding of instructions provided today and agrees to view in [place].   [name], MSW, [email or web address] Phone [phone] 4:43 AM

## 2021-06-28 ENCOUNTER — Telehealth: Payer: Medicare Other | Admitting: Physician Assistant

## 2021-06-28 ENCOUNTER — Encounter: Payer: Self-pay | Admitting: Physician Assistant

## 2021-06-28 ENCOUNTER — Other Ambulatory Visit: Payer: Self-pay

## 2021-06-28 ENCOUNTER — Telehealth: Payer: Self-pay

## 2021-06-28 ENCOUNTER — Ambulatory Visit (INDEPENDENT_AMBULATORY_CARE_PROVIDER_SITE_OTHER): Payer: Medicare Other | Admitting: Physician Assistant

## 2021-06-28 VITALS — Ht 65.0 in | Wt 283.0 lb

## 2021-06-28 DIAGNOSIS — E785 Hyperlipidemia, unspecified: Secondary | ICD-10-CM

## 2021-06-28 DIAGNOSIS — I482 Chronic atrial fibrillation, unspecified: Secondary | ICD-10-CM | POA: Diagnosis not present

## 2021-06-28 DIAGNOSIS — I10 Essential (primary) hypertension: Secondary | ICD-10-CM | POA: Diagnosis not present

## 2021-06-28 DIAGNOSIS — F419 Anxiety disorder, unspecified: Secondary | ICD-10-CM

## 2021-06-28 DIAGNOSIS — F22 Delusional disorders: Secondary | ICD-10-CM

## 2021-06-28 DIAGNOSIS — I5032 Chronic diastolic (congestive) heart failure: Secondary | ICD-10-CM

## 2021-06-28 DIAGNOSIS — I25810 Atherosclerosis of coronary artery bypass graft(s) without angina pectoris: Secondary | ICD-10-CM

## 2021-06-28 DIAGNOSIS — F410 Panic disorder [episodic paroxysmal anxiety] without agoraphobia: Secondary | ICD-10-CM

## 2021-06-28 NOTE — Telephone Encounter (Signed)
Copied from [place] [id number]. Topic: General - Inquiry >> Jun 28, 2021  8:37 AM Loma Boston wrote: Wants to state that all his workers have quit and now he is with [employer] by an Building services engineer. He is fine but everyone was fired that he worked with. Contact [phone]

## 2021-06-28 NOTE — Patient Instructions (Signed)
Medication Instructions:  No changes at this time.  *If you need a refill on your cardiac medications before your next appointment, please call your pharmacy*   Lab Work: None  If you have labs (blood work) drawn today and your tests are completely normal, you will receive your results only by: [place] (if you have MyChart) OR A paper copy in the mail If you have any lab test that is abnormal or we need to change your treatment, we will call you to review the results.   Testing/Procedures: None   Follow-Up: At [facility], you and your health needs are our priority.  As part of our continuing mission to provide you with exceptional heart care, we have created designated Provider Care Teams.  These Care Teams include your primary Cardiologist (physician) and Advanced Practice Providers (APPs -  Physician Assistants and Nurse Practitioners) who all work together to provide you with the care you need, when you need it.   Your next appointment:   6 month(s)  The format for your next appointment:   In Person  Provider:   Ida Rogue, MD

## 2021-06-28 NOTE — Telephone Encounter (Signed)
Pt asked if Evan [name] would be coming to see him or if she will give him a phone call appt /   Pt also mentioned his feet cracking / please advise

## 2021-07-04 ENCOUNTER — Ambulatory Visit: Payer: Medicare Other | Admitting: Licensed Clinical Social Worker

## 2021-07-04 DIAGNOSIS — E1169 Type 2 diabetes mellitus with other specified complication: Secondary | ICD-10-CM

## 2021-07-04 DIAGNOSIS — F22 Delusional disorders: Secondary | ICD-10-CM

## 2021-07-04 DIAGNOSIS — Z8673 Personal history of transient ischemic attack (TIA), and cerebral infarction without residual deficits: Secondary | ICD-10-CM

## 2021-07-04 DIAGNOSIS — F419 Anxiety disorder, unspecified: Secondary | ICD-10-CM

## 2021-07-04 DIAGNOSIS — E1159 Type 2 diabetes mellitus with other circulatory complications: Secondary | ICD-10-CM

## 2021-07-04 DIAGNOSIS — J449 Chronic obstructive pulmonary disease, unspecified: Secondary | ICD-10-CM

## 2021-07-04 DIAGNOSIS — I5032 Chronic diastolic (congestive) heart failure: Secondary | ICD-10-CM

## 2021-07-04 NOTE — Chronic Care Management (AMB) (Signed)
Chronic Care Management    Clinical Social Work Note  07/04/2021 Name: Evan [name] MRN: [id number] DOB: [birth year]  Evan [name] is a 65 y.o. year old male who is a primary care patient of [name], Evan Faster, NP. The CCM team was consulted to assist the patient with chronic disease management and/or care coordination needs related to: Mental Health Counseling and Resources.   Engaged with patient by telephone for follow up visit in response to provider referral for social work chronic care management and care coordination services.   Consent to Services:  The patient was given information about Chronic Care Management services, agreed to services, and gave verbal consent prior to initiation of services.  Please see initial visit note for detailed documentation.   Patient agreed to services and consent obtained.   Consent to Services:  The patient was given information about Care Management services, agreed to services, and gave verbal consent prior to initiation of services.  Please see initial visit note for detailed documentation.   Patient agreed to services today and consent obtained.  Engaged with patient by phone in response to provider referral for social work care coordination services:  Assessment/Interventions:  Patient continues to maintain positive progress with care plan goals. Patient is not interested in ALF or SNF. He prefers to continue residing independently. CCM LCSW will follow up with pt regarding process of obtaining new aid through [employer]. See Care Plan below for interventions and patient self-care activities.  Recent life changes or stressors: Management of health  Recommendation: Patient may benefit from, and is in agreement work with LCSW to address care coordination needs and will continue to work with the clinical team to address health care and disease management related needs.   Follow up Plan: Patient would like continued follow-up from CCM LCSW.  per  patient's request will follow up in 07/28/21.  Will call office if needed prior to next encounter.   SDOH (Social Determinants of Health) assessments and interventions performed:    Advanced Directives Status: Not addressed in this encounter.  CCM Care Plan  Allergies  Allergen Reactions   Penicillin G Hives   Sulfa Antibiotics Hives   Tiotropium    Zoloft [Sertraline Hcl] Other (See Comments)    Outpatient Encounter Medications as of 07/04/2021  Medication Sig   acetaminophen (TYLENOL) 500 MG tablet Take 500 mg by mouth every 6 (six) hours as needed.   albuterol (VENTOLIN HFA) 108 (90 Base) MCG/ACT inhaler Inhale 2 puffs into the lungs every 4 (four) hours as needed for wheezing or shortness of breath.   aspirin 81 MG chewable tablet Chew 1 tablet (81 mg total) by mouth daily.   atorvastatin (LIPITOR) 80 MG tablet Take 1 tablet (80 mg total) by mouth daily.   Blood Glucose Monitoring Suppl (ONETOUCH VERIO) w/Device KIT Use to check blood sugar 3 to 4 times a day and document.  Please bring to visits for review.   Cholecalciferol 125 MCG (5000 UT) TABS Take 1 tablet (5,000 Units total) by mouth daily.   Elastic Bandages & Supports (MEDICAL COMPRESSION STOCKINGS) MISC 1 Package by Does not apply route daily.   empagliflozin (JARDIANCE) 10 MG TABS tablet Take 1 tablet (10 mg total) by mouth daily.   ezetimibe (ZETIA) 10 MG tablet Take 1 tablet (10 mg total) by mouth daily.   furosemide (LASIX) 40 MG tablet TAKE 2 TABLETS BY MOUTH ONCE EVERY MORNING AND 1 TABLET ONCE EVERY EVENING   glucose blood (ONETOUCH VERIO) test  strip Use to check blood sugar 3 to 4 times a day.   metoprolol succinate (TOPROL XL) 50 MG 24 hr tablet Take 1 tablet (50 mg total) by mouth daily.   MODERNA COVID-19 VACCINE 100 MCG/0.5ML injection  (Patient not taking: Reported on 06/28/2021)   nitroGLYCERIN (NITROSTAT) 0.4 MG SL tablet Place 1 tablet (0.4 mg total) under the tongue every 5 (five) minutes as needed for  chest pain.   nystatin (MYCOSTATIN/NYSTOP) powder Apply 1 application topically 3 (three) times daily.   olmesartan (BENICAR) 5 MG tablet Take 2 tablets (10 mg total) by mouth daily.   Omega-3 Fatty Acids (OMEGA 3 500 PO) Take 500 mg by mouth daily.   OneTouch Delica Lancets 69G MISC Use to check blood sugar 2-3 times a day.   pantoprazole (PROTONIX) 40 MG tablet Take 1 tablet (40 mg total) by mouth daily.   predniSONE (DELTASONE) 20 MG tablet Take 3 tablets (60 mg total) by mouth daily. Start on 03/04/21   risperiDONE (RISPERDAL) 0.5 MG tablet Take 1 tablet (0.5 mg total) by mouth 2 (two) times daily.   rivaroxaban (XARELTO) 20 MG TABS tablet Take 1 tablet (20 mg total) by mouth at bedtime.   Saccharomyces boulardii (PROBIOTIC) 250 MG CAPS Take 1 capsule by mouth in the morning and at bedtime.   sharps container 1 each by Does not apply route as needed.   Skin Protectants, Misc. (EUCERIN) cream Apply topically as needed for dry skin.   SYMBICORT 80-4.5 MCG/ACT inhaler INHALE 2 PUFFS BY MOUTH TWICE DAILY   triamcinolone cream (KENALOG) 0.1 % APPLY TO AFFECTED AREAS TWICE DAILY   vitamin B-12 (CYANOCOBALAMIN) 1000 MCG tablet Take 1 tablet (1,000 mcg total) by mouth daily.   No facility-administered encounter medications on file as of 07/04/2021.    Patient Active Problem List   Diagnosis Date Noted   Delusional disorder ([place]) 03/03/2021   Other thrombophilia ([place]) 12/25/2020   Vitamin D deficiency 12/04/2020   Vitamin B12 deficiency 12/04/2020   Stenosis of right carotid artery 12/03/2020   History of CVA (cerebrovascular accident) 11/06/2020   History of 2019 novel coronavirus disease (COVID-19) 09/27/2020   Atherosclerosis of aorta ([place]) 12/04/2019   Persistent atrial fibrillation ([place]) 09/12/2019   CAD (coronary artery disease) 09/12/2019   Chronic venous stasis 09/12/2019   Hoarding disorder 09/12/2019   Cervical spinal stenosis 09/12/2019   Diabetic retinopathy ([place]) 09/12/2019    COPD, mild ([place]) 09/12/2019   Osteoporosis 09/09/2019   History of prostate cancer 09/09/2019   Anxiety 08/10/2019   Obstructive sleep apnea on CPAP 08/10/2019   Hyperlipidemia associated with type 2 diabetes mellitus ([place]) 08/10/2019   Chronic diastolic CHF (congestive heart failure) ([place]) 01/09/2014   Esophageal dysmotility 09/12/2013   Type 2 diabetes mellitus with morbid obesity ([place]) 02/14/2012   Obesity, morbid (more than 100 lbs over ideal weight or BMI > 40) ([place]) 07/26/2011   OLD MYOCARDIAL INFARCTION 03/02/2010   Hypertension associated with diabetes ([place]) 03/20/2009   CORONARY ATHEROSCLEROSIS, ARTERY BYPASS GRAFT 03/20/2009    Conditions to be addressed/monitored: Anxiety and Delusional Disorder ; ADL IADL limitations and Mental Health Concerns   Care Plan : General Social Work (Adult)  Updates made by Rebekah Chesterfield, LCSW since 07/04/2021 12:00 AM     Problem: Response to Treatment (Depression)      Goal: Response to Treatment Maximized   Start Date: 12/27/2020  This Visit's Progress: On track  Recent Progress: On track  Priority: High  Note:  Current barriers:   Acute Mental Health needs related to Anxiety Housing barriers, Level of care concerns, and Mental Health Concerns  Needs Support, Education, and Care Coordination in order to meet unmet mental health needs. Clinical Goal(s): Over the next 120 days, patient will work with SW to reduce or manage symptoms of anxiety and increase knowledge and/or ability of: coping skills, healthy habits, self-management skills, and stress reduction.until connected for ongoing counseling.  Clinical Interventions:  Assessed patient's previous treatment, needs, coping skills, current treatment, support system and barriers to care Patient interviewed and appropriate assessments performed Provided mental health counseling with regard to managing mental health conditions Patient reports difficulty managing symptoms of anxiety  triggered by chronic health conditions Concerns that rent isn't paid for. Concerns for not passing inspection and it needed to be completed 06/23/21 [name] is his leasing agent who contacted patient while CCM LCSW was on phone and shared that she will work with patient on meeting inspection. Scheduled to visit home on Tuesday, 06/28/21 11/28: Patient reports [name] has assisted him with passing the inspection Patient is upset about the way he is treated by multiple personal aids, stating that current aid is dishonest about why she does not show to work. CCM LCSW strongly encouraged patient to contact agency about his concerns to reach a resolution 09/08: [name] is back as his guide for 27 hrs a week [place] [phone] 09/26: Patient informed CCM LCSW that [name] will provide transportation for his upcoming PCP appt scheduled 05/11/21. This information was verified by [name], pt's new aid that will be assisting with ADL's in the home on Mondays, Wednesdays, and Fridays 11/1: Patient reports that he is no longer receiving services from [facility]. He has contacted [name] and is awaiting paperwork regarding home care agencies 11/8: Patient reports inability to work with [name] and her home aids. He has contacted [facility] and requested to work with a new agency. Patient reports he has met with a new aid and will have an assessment completed within a week 11/28: Patient is not interested in ALF or SNF. He prefers to continue residing independently. States he is not in need of an aid anymore, after being without one for a few weeks Patient receives visits from neighbors which promotes mood CCM LCSW discussed CBT strategies to assist with managing symptoms  Patient has scheduled transportation assistance through [employer] to run errands for next week. He is proud of this noting that he does not want to continue using aid for transportation needs Validation and encouragement was provided  during call. CCM LCSW encouraged patient to continue reflecting on past experiences and current friendships to promote positive mood and stress management. States he has enjoyed playing cards online with friends and praying to cope with stressors. 10/10 Patient shared that recent stress has been alleviated with drawing and talking with family and friends  CCM LCSW provided a supportive environment to allow patient to process his emotions Patient reports compliance with medications. Patient utilizes medicine cabinet with labels, AM and PM, to assist with medication management Patient reports that his strong spiritual relationship keeps him encouraged to move forward instead of focusing on past events 11/1: Patient enjoys walking to assist with stress management Patient reports becoming "stir crazy" from staying at home for majority of the day. CCM LCSW discussed benefits of participating in [facility]. Patient reports hx of attending [facility]. States that he disenrolled because staff and participants believed he was "delusional" because he has close relationships  with various celebrities. Patient is not interested in initiating psychiatry and/or counseling to assist with strengthening support system 09/08: Patient reports that he participates in a group weekly to promote socialization with others CCM LCSW discussed strategies to improve communication skills with aid to assist in preventing misunderstandings or offending others  CCM LCSW reviewed upcoming appointments with patient. He reports that he is utilizing [employer] for transportation. His aid has offered to bring patient to local stores to obtain cleaning supplies. Patient reports that he is aware that he can also utilize [employer] for transportation to medical appointments 10/10: Patient has a ride scheduled for 10/18 to take him grocery shopping CCM LCSW discussed strategies to assist patient with memory concerns. Patient agreed to write notes and post them  in high traffic areas of the home to remind him of upcoming appts and/or medications CCM LCSW collaborated with PCP and CCM RN regarding concerns of medication adherence. LCSW informed RNCM of patient's request for a f/up call regarding questions about his cholesterol levels and strategies to manage condition Other interventions: Solution-Focused Strategies, Mindfulness or Relaxation Training, Active listening / Reflection utilized , Emotional Supportive Provided, Psychoeducation for mental health needs , Motivational Interviewing, Participation in counseling encouraged , Participation in support group encouraged , Consideration of in-home help encouraged , and Verbalization of feelings encouraged   Discussed plans with patient for ongoing care management follow up and provided patient with direct contact information for care management team Collaboration with PCP regarding development and update of comprehensive plan of care as evidenced by provider attestation and co-signature Inter-disciplinary care team collaboration (see longitudinal plan of care) Patient Goals/Self-Care Activities: Over the next 120 days Attend all scheduled appointments with providers Contact office with any questions or concerns Utilize [facility] transportation number to assist with attending medical appointments Practice relaxation or meditation daily Practice positive thinking and self-talk      [name], MSW, [email or web address] Phone [phone] 9:44 AM

## 2021-07-04 NOTE — Patient Instructions (Addendum)
Visit Information  Thank you for taking time to visit with me today. Please don't hesitate to contact me if I can be of assistance to you before our next scheduled telephone appointment.  Following are the goals we discussed today:  Patient Goals/Self-Care Activities: Over the next 120 days Attend all scheduled appointments with providers Contact office with any questions or concerns Utilize [facility] transportation number to assist with attending medical appointments Practice relaxation or meditation daily Practice positive thinking and self-talk  Our next appointment is by telephone on 07/28/21 at 9:00 AM  Please call the care guide team at [phone] if you need to cancel or reschedule your appointment.   Please call the Suicide and Crisis Lifeline: 988 call 911 if you are experiencing a Mental Health or [place] or need someone to talk to.  Patient verbalizes understanding of instructions provided today and agrees to view in [place].   [name], MSW, [email or web address] Phone [phone] 9:52 AM

## 2021-07-05 ENCOUNTER — Ambulatory Visit: Payer: Self-pay | Admitting: *Deleted

## 2021-07-05 NOTE — Telephone Encounter (Signed)
Called and spoke to patient. Patient states it has been a while since his last sleep study. Not sure when it was. Advised patient that another sleep study and documentation would be required to order a new CPAP. Patient verbalized understanding.
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
## 2021-07-05 NOTE — Telephone Encounter (Signed)
Routing to provider and Education officer, museum.

## 2021-07-05 NOTE — Telephone Encounter (Signed)
Reason for Disposition  Problems with anxiety or stress  Answer Assessment - Initial Assessment Questions 1. DESCRIPTION: "Please describe your heart rate or heartbeat that you are having" (e.g., fast/slow, regular/irregular, skipped or extra beats, "palpitations")     Feels like he is going to Afib. Due to stress and anxiety  2. ONSET: "When did it start?" (Minutes, hours or days)      2 days ago  3. DURATION: "How long does it last" (e.g., seconds, minutes, hours)     Would not answer.  4. PATTERN "Does it come and go, or has it been constant since it started?"  "Does it get worse with exertion?"   "Are you feeling it now?"     na 5. TAP: "Using your hand, can you tap out what you are feeling on a chair or table in front of you, so that I can hear?" (Note: not all patients can do this)       na 6. HEART RATE: "Can you tell me your heart rate?" "How many beats in 15 seconds?"  (Note: not all patients can do this)       na 7. RECURRENT SYMPTOM: "Have you ever had this before?" If Yes, ask: "When was the last time?" and "What happened that time?"      na 8. CAUSE: "What do you think is causing the palpitations?"     Stress  9. CARDIAC HISTORY: "Do you have any history of heart disease?" (e.g., heart attack, angina, bypass surgery, angioplasty, arrhythmia)      na 10. OTHER SYMPTOMS: "Do you have any other symptoms?" (e.g., dizziness, chest pain, sweating, difficulty breathing)       Denies  Answer Assessment - Initial Assessment Questions 1. CONCERN: "Did anything happen that prompted you to call today?"      Financial banking compromised and feels someone is trying to steal his information into his banking and food stamps.  2. ANXIETY SYMPTOMS: "Can you describe how you (your loved one; patient) have been feeling?" (e.g., tense, restless, panicky, anxious, keyed up, overwhelmed, sense of impending doom).      Anxious  3. ONSET: "How long have you been feeling this way?" (e.g., hours,  days, weeks)     X 2 days  4. SEVERITY: "How would you rate the level of anxiety?" (e.g., 0 - 10; or mild, moderate, severe).     Moderate  5. FUNCTIONAL IMPAIRMENT: "How have these feelings affected your ability to do daily activities?" "Have you had more difficulty than usual doing your normal daily activities?" (e.g., getting better, same, worse; self-care, school, work, interactions)     na 6. HISTORY: "Have you felt this way before?" "Have you ever been diagnosed with an anxiety problem in the past?" (e.g., generalized anxiety disorder, panic attacks, PTSD). If Yes, ask: "How was this problem treated?" (e.g., medicines, counseling, etc.)     Yes  7. RISK OF HARM - SUICIDAL IDEATION: "Do you ever have thoughts of hurting or killing yourself?" If Yes, ask:  "Do you have these feelings now?" "Do you have a plan on how you would do this?"     na 8. TREATMENT:  "What has been done so far to treat this anxiety?" (e.g., medicines, relaxation strategies). "What has helped?"     na 9. TREATMENT - THERAPIST: "Do you have a counselor or therapist? Name?"     na 10. POTENTIAL TRIGGERS: "Do you drink caffeinated beverages (e.g., coffee, colas, teas), and how much daily?" "Do  you drink alcohol or use any drugs?" "Have you started any new medicines recently?"     na 10. PATIENT SUPPORT: "Who is with you now?" "Who do you live with?" "Do you have family or friends who you can talk to?"        na 11. OTHER SYMPTOMS: "Do you have any other symptoms?" (e.g., feeling depressed, trouble concentrating, trouble sleeping, trouble breathing, palpitations or fast heartbeat, chest pain, sweating, nausea, or diarrhea)       Requires redirection  12. PREGNANCY: "Is there any chance you are pregnant?" "When was your last menstrual period?"       na  Protocols used: Heart Rate and Heartbeat Questions-A-AH, Anxiety and Panic Attack-A-AH

## 2021-07-06 ENCOUNTER — Ambulatory Visit: Payer: Self-pay | Admitting: Licensed Clinical Social Worker

## 2021-07-06 DIAGNOSIS — F22 Delusional disorders: Secondary | ICD-10-CM

## 2021-07-06 DIAGNOSIS — I152 Hypertension secondary to endocrine disorders: Secondary | ICD-10-CM

## 2021-07-06 DIAGNOSIS — F419 Anxiety disorder, unspecified: Secondary | ICD-10-CM

## 2021-07-06 DIAGNOSIS — E785 Hyperlipidemia, unspecified: Secondary | ICD-10-CM

## 2021-07-06 DIAGNOSIS — J449 Chronic obstructive pulmonary disease, unspecified: Secondary | ICD-10-CM

## 2021-07-06 DIAGNOSIS — E1169 Type 2 diabetes mellitus with other specified complication: Secondary | ICD-10-CM

## 2021-07-06 DIAGNOSIS — E1159 Type 2 diabetes mellitus with other circulatory complications: Secondary | ICD-10-CM

## 2021-07-06 DIAGNOSIS — I5032 Chronic diastolic (congestive) heart failure: Secondary | ICD-10-CM

## 2021-07-08 ENCOUNTER — Telehealth: Payer: Self-pay | Admitting: Nurse Practitioner

## 2021-07-08 ENCOUNTER — Ambulatory Visit: Payer: Self-pay
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
## 2021-07-08 NOTE — Patient Instructions (Signed)
Visit Information  Thank you for taking time to visit with me today. Please don't hesitate to contact me if I can be of assistance to you before our next scheduled telephone appointment.  Following are the goals we discussed today:  Patient Goals/Self-Care Activities: Over the next 120 days Attend all scheduled appointments with providers Contact office with any questions or concerns Utilize [facility] transportation number to assist with attending medical appointments Practice relaxation or meditation daily Practice positive thinking and self-talk  Please call the care guide team at [phone] if you need to cancel or reschedule your appointment.   If you are experiencing a Mental Health or [place] or need someone to talk to, please call 911   Patient verbalizes understanding of instructions provided today and agrees to view in [place].   Telephone follow up appointment with care management team member scheduled for:1-2 weeks  [name], MSW, [email or web address] Phone [phone] 4:52 PM

## 2021-07-08 NOTE — Chronic Care Management (AMB) (Signed)
Chronic Care Management    Clinical Social Work Note  07/08/2021 Name: Evan [name] MRN: [id number] DOB: [birth year]  Evan [name] is a 65 y.o. year old male who is a primary care patient of [name], [name], NP. The CCM team was consulted to assist the patient with chronic disease management and/or care coordination needs related to: Mental Health Counseling and Resources.   Engaged with patient by telephone for follow up visit in response to provider referral for social work chronic care management and care coordination services.   Consent to Services:  The patient was given information about Chronic Care Management services, agreed to services, and gave verbal consent prior to initiation of services.  Please see initial visit note for detailed documentation.   Patient agreed to services and consent obtained.   Consent to Services:  The patient was given information about Care Management services, agreed to services, and gave verbal consent prior to initiation of services.  Please see initial visit note for detailed documentation.   Patient agreed to services today and consent obtained.  Engaged with patient by phone in response to provider referral for social work care coordination services:  Assessment/Interventions: Patient reports stress regarding fraud, which has been resolved by bank. CCM LCSW discussed strategies to decrease risk of fraud during the holiday season. Patient was strongly encouraged to re-consider ALF to provide improved socialization and support. See Care Plan below for interventions and patient self-care activities.  Recent life changes or stressors: Fraud  Recommendation: Patient may benefit from, and is in agreement work with LCSW to address care coordination needs and will continue to work with the clinical team to address health care and disease management related needs.   Follow up Plan: Patient would like continued follow-up from CCM LCSW.  per  patient's request will follow up in 1-2 weeks.  Will call office if needed prior to next encounter.   SDOH (Social Determinants of Health) assessments and interventions performed:    Advanced Directives Status: Not addressed in this encounter.  CCM Care Plan  Allergies  Allergen Reactions   Penicillin G Hives   Sulfa Antibiotics Hives   Tiotropium    Zoloft [Sertraline Hcl] Other (See Comments)    Outpatient Encounter Medications as of 07/06/2021  Medication Sig   acetaminophen (TYLENOL) 500 MG tablet Take 500 mg by mouth every 6 (six) hours as needed.   albuterol (VENTOLIN HFA) 108 (90 Base) MCG/ACT inhaler Inhale 2 puffs into the lungs every 4 (four) hours as needed for wheezing or shortness of breath.   aspirin 81 MG chewable tablet Chew 1 tablet (81 mg total) by mouth daily.   atorvastatin (LIPITOR) 80 MG tablet Take 1 tablet (80 mg total) by mouth daily.   Blood Glucose Monitoring Suppl (ONETOUCH VERIO) w/Device KIT Use to check blood sugar 3 to 4 times a day and document.  Please bring to visits for review.   Cholecalciferol 125 MCG (5000 UT) TABS Take 1 tablet (5,000 Units total) by mouth daily.   Elastic Bandages & Supports (MEDICAL COMPRESSION STOCKINGS) MISC 1 Package by Does not apply route daily.   empagliflozin (JARDIANCE) 10 MG TABS tablet Take 1 tablet (10 mg total) by mouth daily.   ezetimibe (ZETIA) 10 MG tablet Take 1 tablet (10 mg total) by mouth daily.   furosemide (LASIX) 40 MG tablet TAKE 2 TABLETS BY MOUTH ONCE EVERY MORNING AND 1 TABLET ONCE EVERY EVENING   glucose blood (ONETOUCH VERIO) test strip Use to check  blood sugar 3 to 4 times a day.   metoprolol succinate (TOPROL XL) 50 MG 24 hr tablet Take 1 tablet (50 mg total) by mouth daily.   MODERNA COVID-19 VACCINE 100 MCG/0.5ML injection  (Patient not taking: Reported on 06/28/2021)   nitroGLYCERIN (NITROSTAT) 0.4 MG SL tablet Place 1 tablet (0.4 mg total) under the tongue every 5 (five) minutes as needed for  chest pain.   nystatin (MYCOSTATIN/NYSTOP) powder Apply 1 application topically 3 (three) times daily.   olmesartan (BENICAR) 5 MG tablet Take 2 tablets (10 mg total) by mouth daily.   Omega-3 Fatty Acids (OMEGA 3 500 PO) Take 500 mg by mouth daily.   OneTouch Delica Lancets 67H MISC Use to check blood sugar 2-3 times a day.   pantoprazole (PROTONIX) 40 MG tablet Take 1 tablet (40 mg total) by mouth daily.   predniSONE (DELTASONE) 20 MG tablet Take 3 tablets (60 mg total) by mouth daily. Start on 03/04/21   risperiDONE (RISPERDAL) 0.5 MG tablet Take 1 tablet (0.5 mg total) by mouth 2 (two) times daily.   rivaroxaban (XARELTO) 20 MG TABS tablet Take 1 tablet (20 mg total) by mouth at bedtime.   Saccharomyces boulardii (PROBIOTIC) 250 MG CAPS Take 1 capsule by mouth in the morning and at bedtime.   sharps container 1 each by Does not apply route as needed.   Skin Protectants, Misc. (EUCERIN) cream Apply topically as needed for dry skin.   SYMBICORT 80-4.5 MCG/ACT inhaler INHALE 2 PUFFS BY MOUTH TWICE DAILY   triamcinolone cream (KENALOG) 0.1 % APPLY TO AFFECTED AREAS TWICE DAILY   vitamin B-12 (CYANOCOBALAMIN) 1000 MCG tablet Take 1 tablet (1,000 mcg total) by mouth daily.   No facility-administered encounter medications on file as of 07/06/2021.    Patient Active Problem List   Diagnosis Date Noted   Delusional disorder ([place]) 03/03/2021   Other thrombophilia ([place]) 12/25/2020   Vitamin D deficiency 12/04/2020   Vitamin B12 deficiency 12/04/2020   Stenosis of right carotid artery 12/03/2020   History of CVA (cerebrovascular accident) 11/06/2020   History of 2019 novel coronavirus disease (COVID-19) 09/27/2020   Atherosclerosis of aorta ([place]) 12/04/2019   Persistent atrial fibrillation ([place]) 09/12/2019   CAD (coronary artery disease) 09/12/2019   Chronic venous stasis 09/12/2019   Hoarding disorder 09/12/2019   Cervical spinal stenosis 09/12/2019   Diabetic retinopathy ([place]) 09/12/2019    COPD, mild ([place]) 09/12/2019   Osteoporosis 09/09/2019   History of prostate cancer 09/09/2019   Anxiety 08/10/2019   Obstructive sleep apnea on CPAP 08/10/2019   Hyperlipidemia associated with type 2 diabetes mellitus ([place]) 08/10/2019   Chronic diastolic CHF (congestive heart failure) ([place]) 01/09/2014   Esophageal dysmotility 09/12/2013   Type 2 diabetes mellitus with morbid obesity ([place]) 02/14/2012   Obesity, morbid (more than 100 lbs over ideal weight or BMI > 40) ([place]) 07/26/2011   OLD MYOCARDIAL INFARCTION 03/02/2010   Hypertension associated with diabetes ([place]) 03/20/2009   CORONARY ATHEROSCLEROSIS, ARTERY BYPASS GRAFT 03/20/2009    Conditions to be addressed/monitored: Anxiety and Delusional Disorder ; Limited social support, Level of care concerns, and Mental Health Concerns   Care Plan : General Social Work (Adult)  Updates made by Rebekah Chesterfield, LCSW since 07/08/2021 12:00 AM     Problem: Response to Treatment (Depression)      Goal: Response to Treatment Maximized   Start Date: 12/27/2020  This Visit's Progress: On track  Recent Progress: On track  Priority: High  Note:  Current barriers:   Acute Mental Health needs related to Anxiety Housing barriers, Level of care concerns, and Mental Health Concerns  Needs Support, Education, and Care Coordination in order to meet unmet mental health needs. Clinical Goal(s): Over the next 120 days, patient will work with SW to reduce or manage symptoms of anxiety and increase knowledge and/or ability of: coping skills, healthy habits, self-management skills, and stress reduction.until connected for ongoing counseling.  Clinical Interventions:  Assessed patient's previous treatment, needs, coping skills, current treatment, support system and barriers to care Patient interviewed and appropriate assessments performed Provided mental health counseling with regard to managing mental health conditions Patient reports difficulty  managing symptoms of anxiety triggered by chronic health conditions Concerns that rent isn't paid for. Concerns for not passing inspection and it needed to be completed 06/23/21 [name] is his leasing agent who contacted patient while CCM LCSW was on phone and shared that she will work with patient on meeting inspection. Scheduled to visit home on Tuesday, 06/28/21 11/28: Patient reports [name] has assisted him with passing the inspection Patient is upset about the way he is treated by multiple personal aids, stating that current aid is dishonest about why she does not show to work. CCM LCSW strongly encouraged patient to contact agency about his concerns to reach a resolution 09/08: [name] is back as his guide for 27 hrs a week [place] [phone] 09/26: Patient informed CCM LCSW that [name] will provide transportation for his upcoming PCP appt scheduled 05/11/21. This information was verified by [name], pt's new aid that will be assisting with ADL's in the home on Mondays, Wednesdays, and Fridays 11/1: Patient reports that he is no longer receiving services from [facility]. He has contacted [name] and is awaiting paperwork regarding home care agencies 11/8: Patient reports inability to work with [name] and her home aids. He has contacted [facility] and requested to work with a new agency. Patient reports he has met with a new aid and will have an assessment completed within a week 11/28: Patient is not interested in ALF or SNF. He prefers to continue residing independently. CCM LCSW will follow up with pt regarding process of obtaining new aid through [facility] 11/30: Patient was strongly encouraged to re-consider ALF to provide improved socialization and support Patient receives visits from neighbors which promotes mood CCM LCSW discussed CBT strategies to assist with managing symptoms  Patient reports recent attempt for bank fraud. The bank restored his account and are sending new cards.  Patient identified strategies to prevent future scams, including, locking his belongings in a safe at home, feels previous aids were stealing his info Validation and encouragement was provided during call. CCM LCSW encouraged patient to continue reflecting on past experiences and current friendships to promote positive mood and stress management. States he has enjoyed playing cards online with friends and praying to cope with stressors. 10/10 Patient shared that recent stress has been alleviated with drawing and talking with family and friends  CCM LCSW provided a supportive environment to allow patient to process his emotions Patient reports compliance with medications. Patient utilizes medicine cabinet with labels, AM and PM, to assist with medication management Patient reports that his strong spiritual relationship keeps him encouraged to move forward instead of focusing on past events 11/1: Patient enjoys walking to assist with stress management Patient reports becoming "stir crazy" from staying at home for majority of the day. CCM LCSW discussed benefits of participating in [facility]. Patient reports hx of  attending [facility]. States that he disenrolled because staff and participants believed he was "delusional" because he has close relationships with various celebrities. Patient is not interested in initiating psychiatry and/or counseling to assist with strengthening support system 09/08: Patient reports that he participates in a group weekly to promote socialization with others CCM LCSW discussed strategies to improve communication skills with aid to assist in preventing misunderstandings or offending others  CCM LCSW reviewed upcoming appointments with patient. He reports that he is utilizing [employer] for transportation. His aid has offered to bring patient to local stores to obtain cleaning supplies. Patient reports that he is aware that he can also utilize [employer] for transportation to medical  appointments 10/10: Patient has a ride scheduled for 10/18 to take him grocery shopping CCM LCSW discussed strategies to assist patient with memory concerns. Patient agreed to write notes and post them in high traffic areas of the home to remind him of upcoming appts and/or medications CCM LCSW collaborated with PCP and CCM RN regarding concerns of medication adherence. LCSW informed RNCM of patient's request for a f/up call regarding questions about his cholesterol levels and strategies to manage condition Other interventions: Solution-Focused Strategies, Mindfulness or Relaxation Training, Active listening / Reflection utilized , Emotional Supportive Provided, Psychoeducation for mental health needs , Motivational Interviewing, Participation in counseling encouraged , Participation in support group encouraged , Consideration of in-home help encouraged , and Verbalization of feelings encouraged   Discussed plans with patient for ongoing care management follow up and provided patient with direct contact information for care management team Collaboration with PCP regarding development and update of comprehensive plan of care as evidenced by provider attestation and co-signature Inter-disciplinary care team collaboration (see longitudinal plan of care) Patient Goals/Self-Care Activities: Over the next 120 days Attend all scheduled appointments with providers Contact office with any questions or concerns Utilize [facility] transportation number to assist with attending medical appointments Practice relaxation or meditation daily Practice positive thinking and self-talk     [name], MSW, [email or web address] Phone [phone] 4:47 PM

## 2021-07-08 NOTE — Telephone Encounter (Signed)
Pt is calling to requesting Jasmine to write a letter for his landlord stating that he is unable to pay his rent because his accounts are frozen. P Please advise CB-  [phone]

## 2021-07-09 ENCOUNTER — Encounter: Payer: Self-pay | Admitting: Emergency Medicine

## 2021-07-09 ENCOUNTER — Emergency Department
Admission: EM | Admit: 2021-07-09 | Discharge: 2021-07-09 | Disposition: A | Discharge status: Home / Self Care | Payer: Medicare Other | Attending: Emergency Medicine | Admitting: Emergency Medicine

## 2021-07-09 ENCOUNTER — Emergency Department: Payer: Medicare Other

## 2021-07-09 ENCOUNTER — Other Ambulatory Visit: Payer: Self-pay

## 2021-07-09 DIAGNOSIS — E1169 Type 2 diabetes mellitus with other specified complication: Secondary | ICD-10-CM | POA: Diagnosis not present

## 2021-07-09 DIAGNOSIS — Z7982 Long term (current) use of aspirin: Secondary | ICD-10-CM | POA: Insufficient documentation

## 2021-07-09 DIAGNOSIS — I5032 Chronic diastolic (congestive) heart failure: Secondary | ICD-10-CM | POA: Diagnosis not present

## 2021-07-09 DIAGNOSIS — R609 Edema, unspecified: Secondary | ICD-10-CM | POA: Diagnosis not present

## 2021-07-09 DIAGNOSIS — S60221A Contusion of right hand, initial encounter: Secondary | ICD-10-CM | POA: Insufficient documentation

## 2021-07-09 DIAGNOSIS — E876 Hypokalemia: Secondary | ICD-10-CM | POA: Diagnosis not present

## 2021-07-09 DIAGNOSIS — I4891 Unspecified atrial fibrillation: Secondary | ICD-10-CM | POA: Insufficient documentation

## 2021-07-09 DIAGNOSIS — I110 Hypertensive heart disease with heart failure: Secondary | ICD-10-CM | POA: Insufficient documentation

## 2021-07-09 DIAGNOSIS — G4733 Obstructive sleep apnea (adult) (pediatric): Secondary | ICD-10-CM | POA: Diagnosis not present

## 2021-07-09 DIAGNOSIS — R519 Headache, unspecified: Secondary | ICD-10-CM | POA: Diagnosis not present

## 2021-07-09 DIAGNOSIS — R42 Dizziness and giddiness: Secondary | ICD-10-CM | POA: Diagnosis not present

## 2021-07-09 DIAGNOSIS — J449 Chronic obstructive pulmonary disease, unspecified: Secondary | ICD-10-CM | POA: Insufficient documentation

## 2021-07-09 DIAGNOSIS — I2510 Atherosclerotic heart disease of native coronary artery without angina pectoris: Secondary | ICD-10-CM | POA: Diagnosis not present

## 2021-07-09 DIAGNOSIS — M79671 Pain in right foot: Secondary | ICD-10-CM | POA: Diagnosis not present

## 2021-07-09 DIAGNOSIS — M7731 Calcaneal spur, right foot: Secondary | ICD-10-CM | POA: Insufficient documentation

## 2021-07-09 DIAGNOSIS — L299 Pruritus, unspecified: Secondary | ICD-10-CM | POA: Diagnosis not present

## 2021-07-09 DIAGNOSIS — Z7901 Long term (current) use of anticoagulants: Secondary | ICD-10-CM | POA: Insufficient documentation

## 2021-07-09 DIAGNOSIS — Z6841 Body Mass Index (BMI) 40.0 and over, adult: Secondary | ICD-10-CM | POA: Insufficient documentation

## 2021-07-09 DIAGNOSIS — Z743 Need for continuous supervision: Secondary | ICD-10-CM | POA: Diagnosis not present

## 2021-07-09 DIAGNOSIS — W228XXA Striking against or struck by other objects, initial encounter: Secondary | ICD-10-CM | POA: Insufficient documentation

## 2021-07-09 DIAGNOSIS — R239 Unspecified skin changes: Secondary | ICD-10-CM | POA: Diagnosis not present

## 2021-07-09 DIAGNOSIS — R6889 Other general symptoms and signs: Secondary | ICD-10-CM | POA: Diagnosis not present

## 2021-07-09 DIAGNOSIS — L03115 Cellulitis of right lower limb: Secondary | ICD-10-CM

## 2021-07-09 LAB — COMPREHENSIVE METABOLIC PANEL
ALT: 18 U/L (ref 0–44)
AST: 19 U/L (ref 15–41)
Albumin: 3.9 g/dL (ref 3.5–5.0)
Alkaline Phosphatase: 82 U/L (ref 38–126)
Anion gap: 9 (ref 5–15)
BUN: 14 mg/dL (ref 8–23)
CO2: 24 mmol/L (ref 22–32)
Calcium: 8.7 mg/dL — ABNORMAL LOW (ref 8.9–10.3)
Chloride: 104 mmol/L (ref 98–111)
Creatinine, Ser: 0.78 mg/dL (ref 0.61–1.24)
GFR, Estimated: >60 mL/min (ref >60)
Glucose, Bld: 123 mg/dL — ABNORMAL HIGH (ref 70–99)
Potassium: 3.4 mmol/L — ABNORMAL LOW (ref 3.5–5.1)
Sodium: 137 mmol/L (ref 135–145)
Total Bilirubin: 0.7 mg/dL (ref 0.3–1.2)
Total Protein: 7 g/dL (ref 6.5–8.1)

## 2021-07-09 LAB — CBC WITH DIFFERENTIAL/PLATELET
Abs Immature Granulocytes: 0.03 10*3/uL (ref 0.00–0.07)
Basophils Absolute: 0 10*3/uL (ref 0.0–0.1)
Basophils Relative: 0 %
Eosinophils Absolute: 0.2 10*3/uL (ref 0.0–0.5)
Eosinophils Relative: 2 %
HCT: 43.9 % (ref 39.0–52.0)
Hemoglobin: 14.3 g/dL (ref 13.0–17.0)
Immature Granulocytes: 0 %
Lymphocytes Relative: 17 %
Lymphs Abs: 1.5 10*3/uL (ref 0.7–4.0)
MCH: 29.4 pg (ref 26.0–34.0)
MCHC: 32.6 g/dL (ref 30.0–36.0)
MCV: 90.1 fL (ref 80.0–100.0)
Monocytes Absolute: 0.6 10*3/uL (ref 0.1–1.0)
Monocytes Relative: 7 %
Neutro Abs: 6.6 10*3/uL (ref 1.7–7.7)
Neutrophils Relative %: 74 %
Platelets: 213 10*3/uL (ref 150–400)
RBC: 4.87 MIL/uL (ref 4.22–5.81)
RDW: 14.6 % (ref 11.5–15.5)
WBC: 9 10*3/uL (ref 4.0–10.5)
nRBC: 0 % (ref 0.0–0.2)

## 2021-07-09 LAB — CK: Total CK: 106 U/L (ref 49–397)

## 2021-07-09 LAB — MAGNESIUM: Magnesium: 2.2 mg/dL (ref 1.7–2.4)

## 2021-07-09 LAB — TROPONIN I (HIGH SENSITIVITY): Troponin I (High Sensitivity): 13 ng/L (ref <18)

## 2021-07-09 IMAGING — CT CT HEAD W/O CM
4 series · 17 of 47 positions shown, 19 images · non-contrast
Comparison: Brain MRI and head CT [DATE].

CLINICAL DATA: 65-year-old male with headache.

EXAM:
CT HEAD WITHOUT CONTRAST
TECHNIQUE: Contiguous axial images were obtained from the base of the skull
through the vertex without intravenous contrast.

[Series 2: head wo · axial · 0.44mm/px · z∈[-128,+2]mm · 7 of 36 slices shown, 9 images]
[im 5/36  brain]
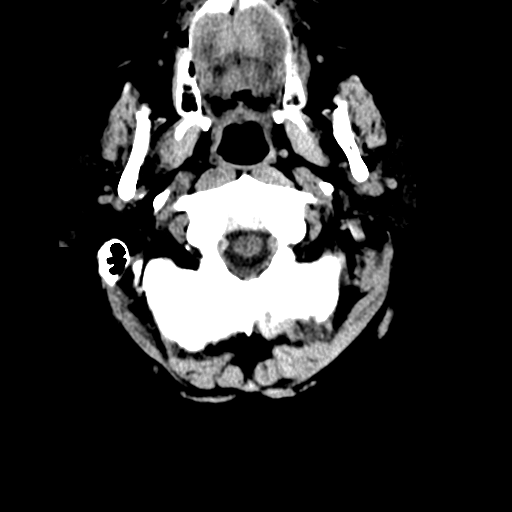
[im 5/36  bone]
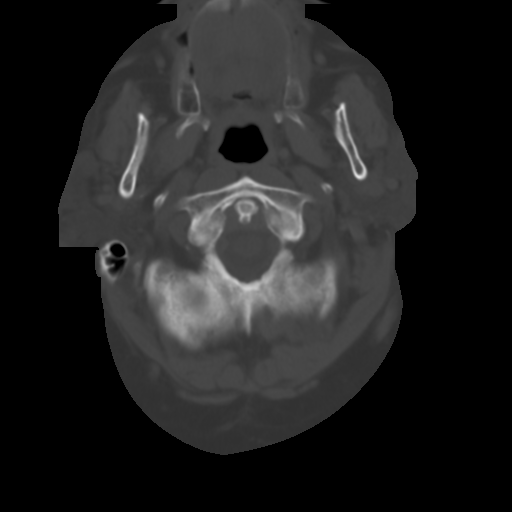
[im 9/36  brain]
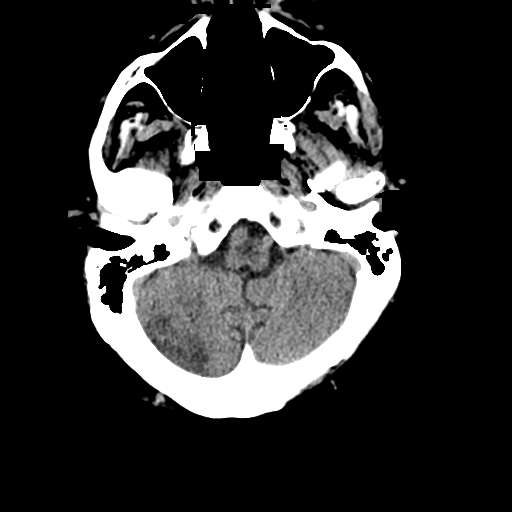
[im 14/36  brain]
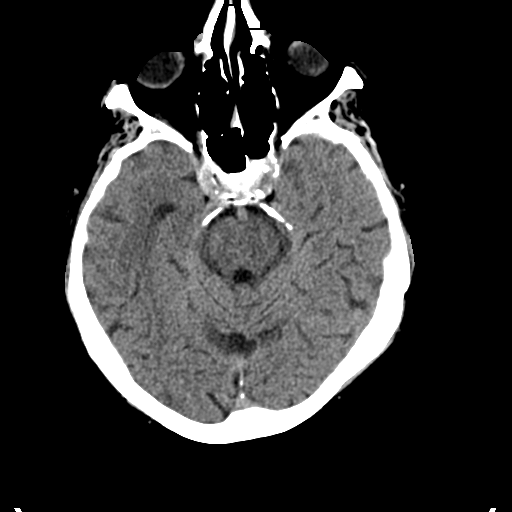
[im 18/36  brain]
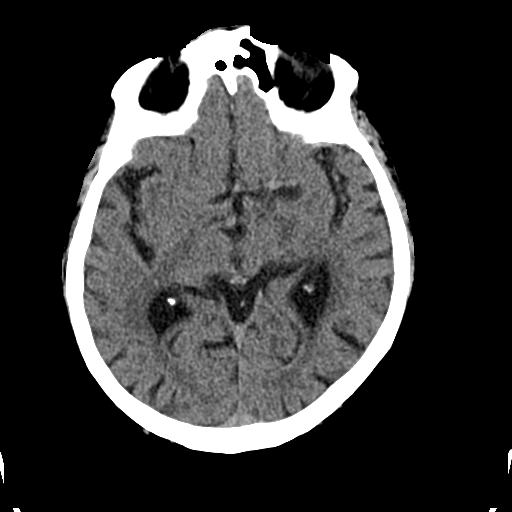
[im 22/36  brain]
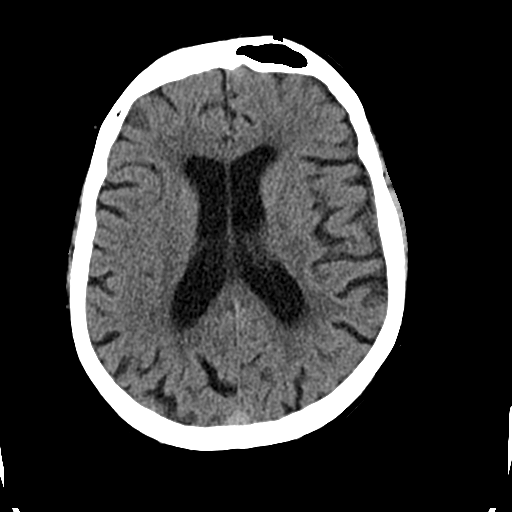
[im 22/36  bone]
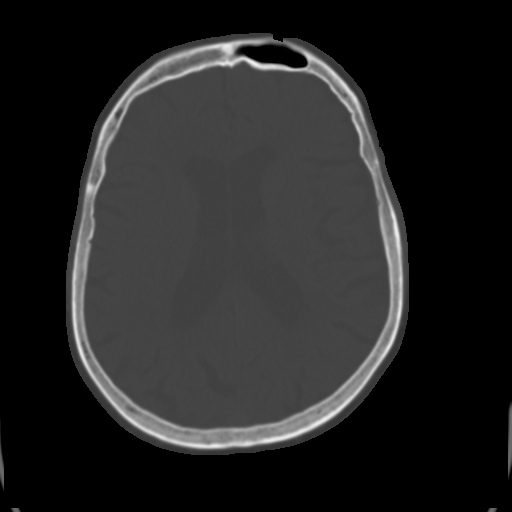
[im 27/36  brain]
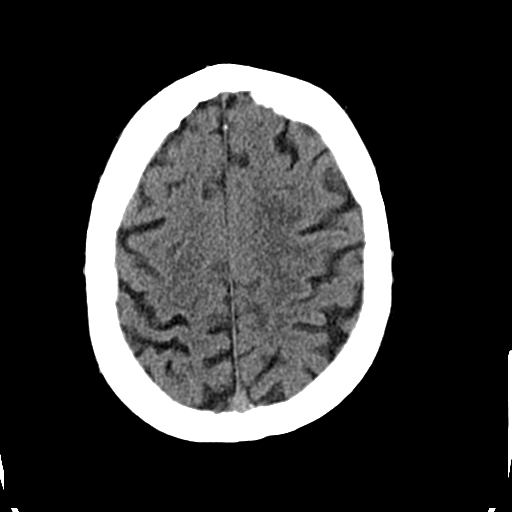
[im 31/36  brain]
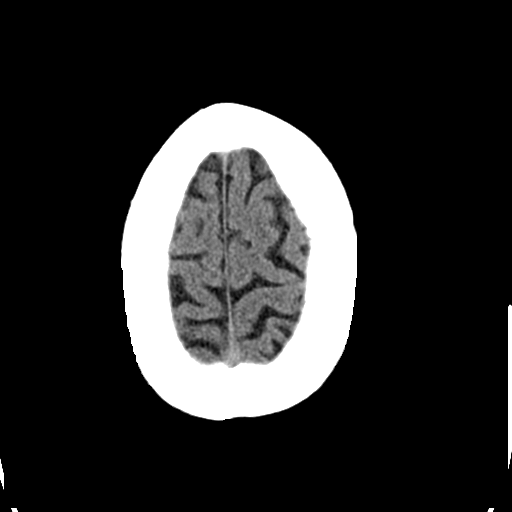

[Series 3: head bone · axial · 0.44mm/px · z∈[-130,-68]mm · 4 of 91 slices shown]
[im 10/91  bone]
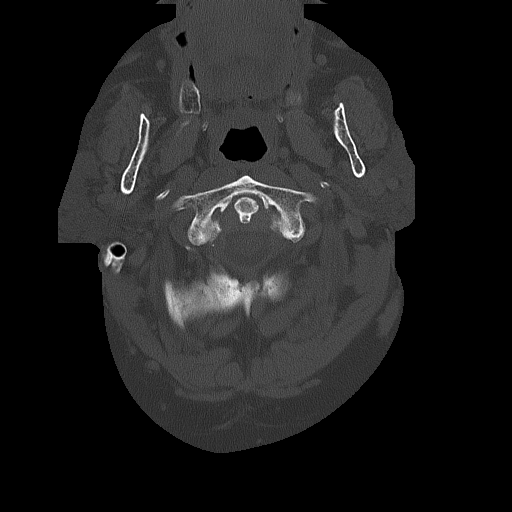
[im 19/91  bone]
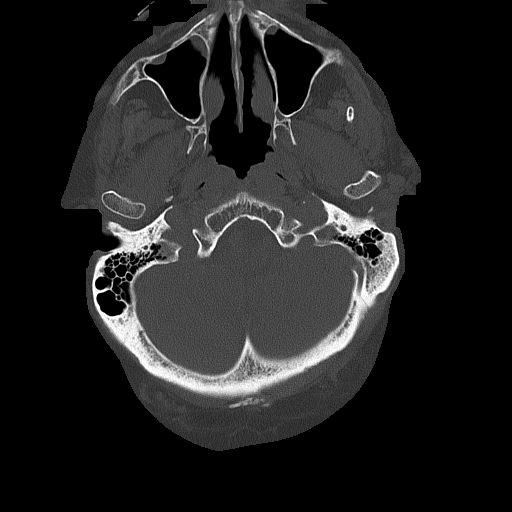
[im 28/91  bone]
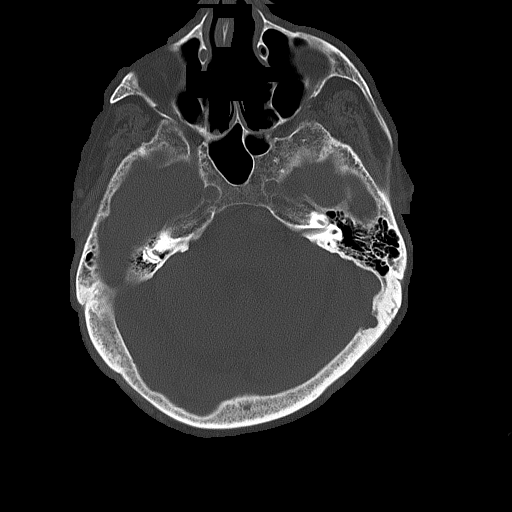
[im 41/91  bone]
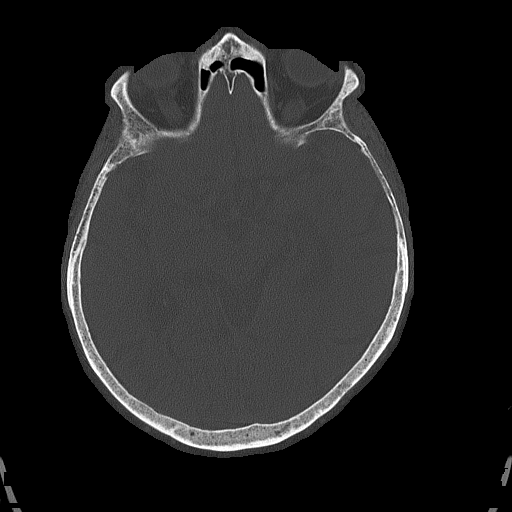

[Series 4: coronal soft tissue · coronal · 0.37mm/px · 3 of 79 slices shown]
[im 27/79  brain]
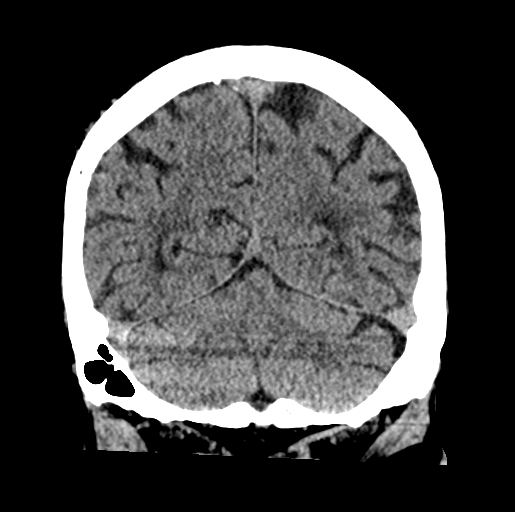
[im 35/79  brain]
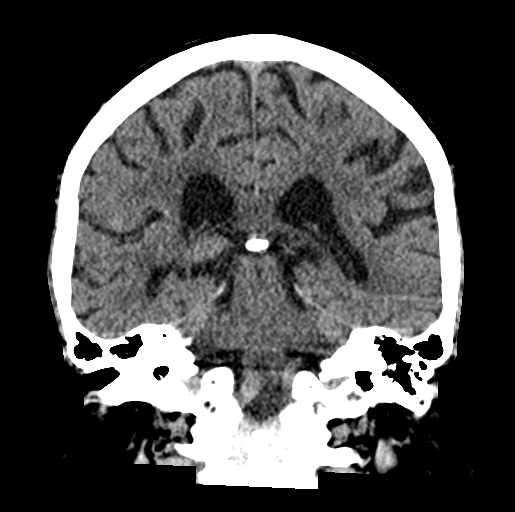
[im 44/79  brain]
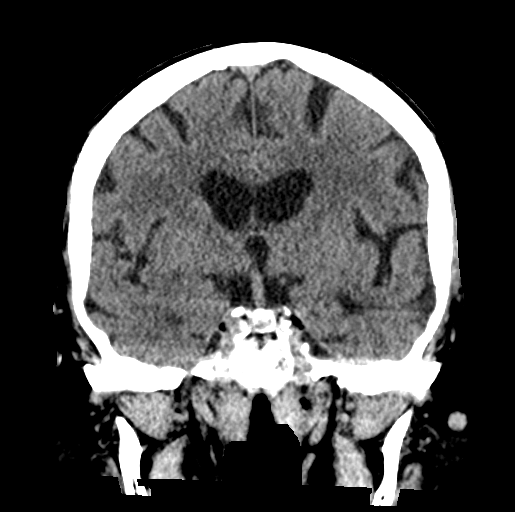

[Series 5: sagittal soft tissue · sagittal · 0.37mm/px · 3 of 64 slices shown]
[im 22/64  brain]
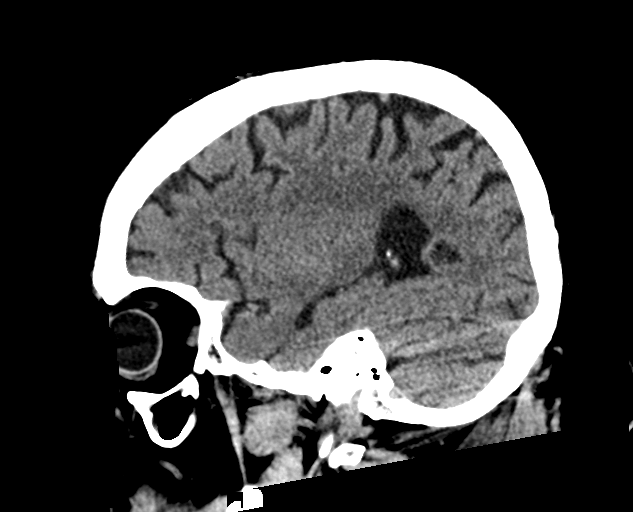
[im 32/64  brain]
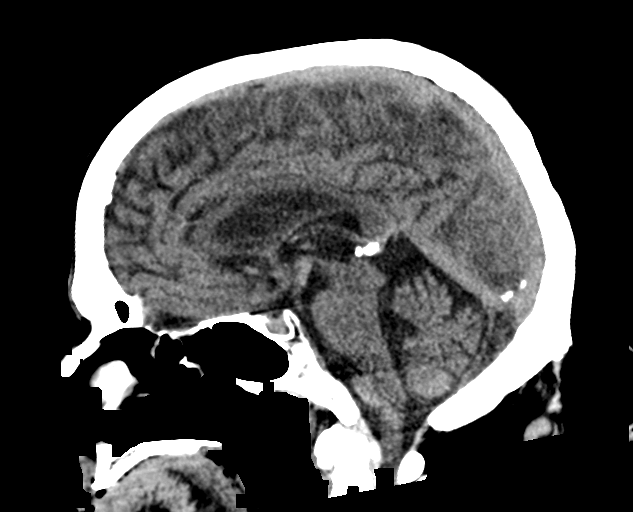
[im 43/64  brain]
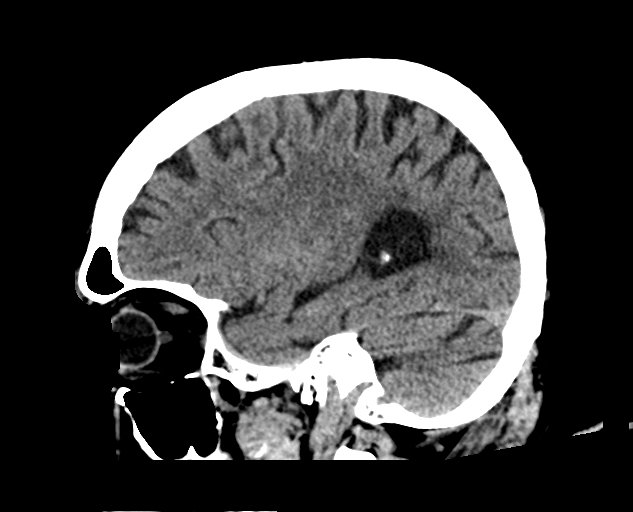

[17 of 47 positions shown; findings below may reference images not displayed]

FINDINGS: Brain: Expected evolution of the right thalamic lacunar infarcts
since [place]OYO. Stable cerebral volume. No midline shift,
ventriculomegaly, mass effect, evidence of mass lesion, intracranial
hemorrhage or evidence of cortically based acute infarction. Small
chronic right cerebellar infarct better demonstrated by MRI. Stable
gray-white matter differentiation outside of the right thalamus.

Vascular: Calcified atherosclerosis at the skull base. No suspicious
intracranial vascular hyperdensity.

Skull: No acute osseous abnormality identified.

Sinuses/Orbits: Paranasal Visualized paranasal sinuses and mastoids
are stable and well aerated.

Other: No acute orbit or scalp soft tissue finding.
IMPRESSION: 1. No acute intracranial abnormality.
2. Expected evolution of the right thalamic infarct in [place].
Additional chronic small vessel disease better demonstrated by MRI.

## 2021-07-09 IMAGING — DX DG FOOT COMPLETE 3+V*R*
3 series · 3 of 3 positions shown · non-contrast
Comparison: None.

CLINICAL DATA: Right foot pain.

EXAM:
RIGHT FOOT COMPLETE - 3+ VIEW

[foot ap]
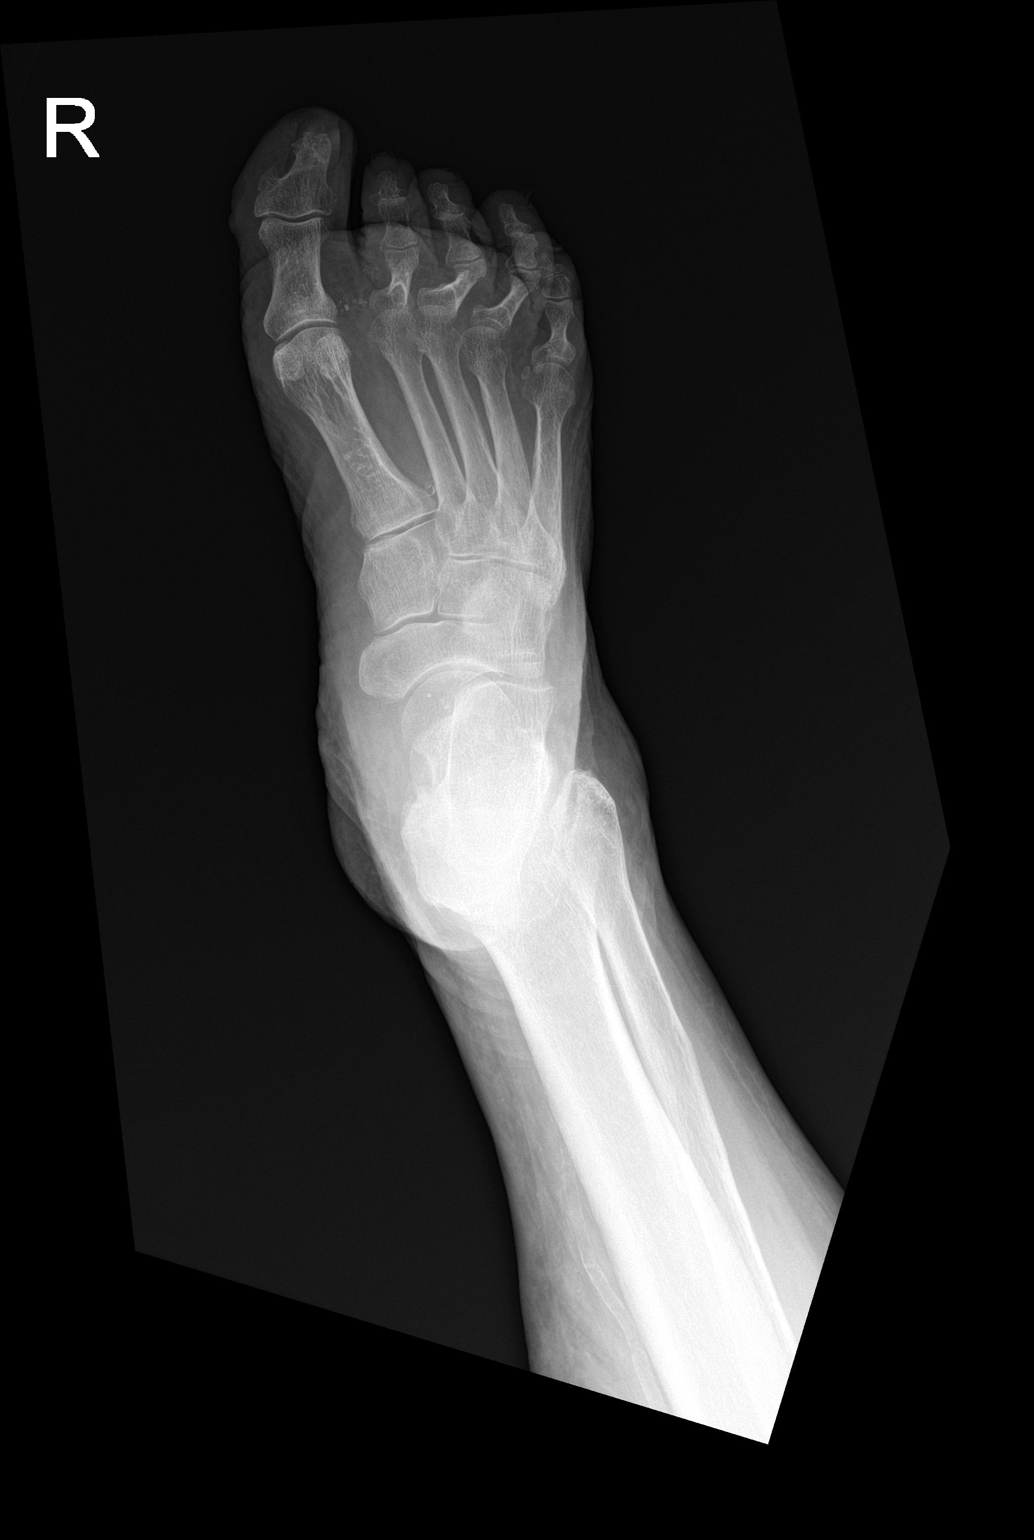

[foot obl]
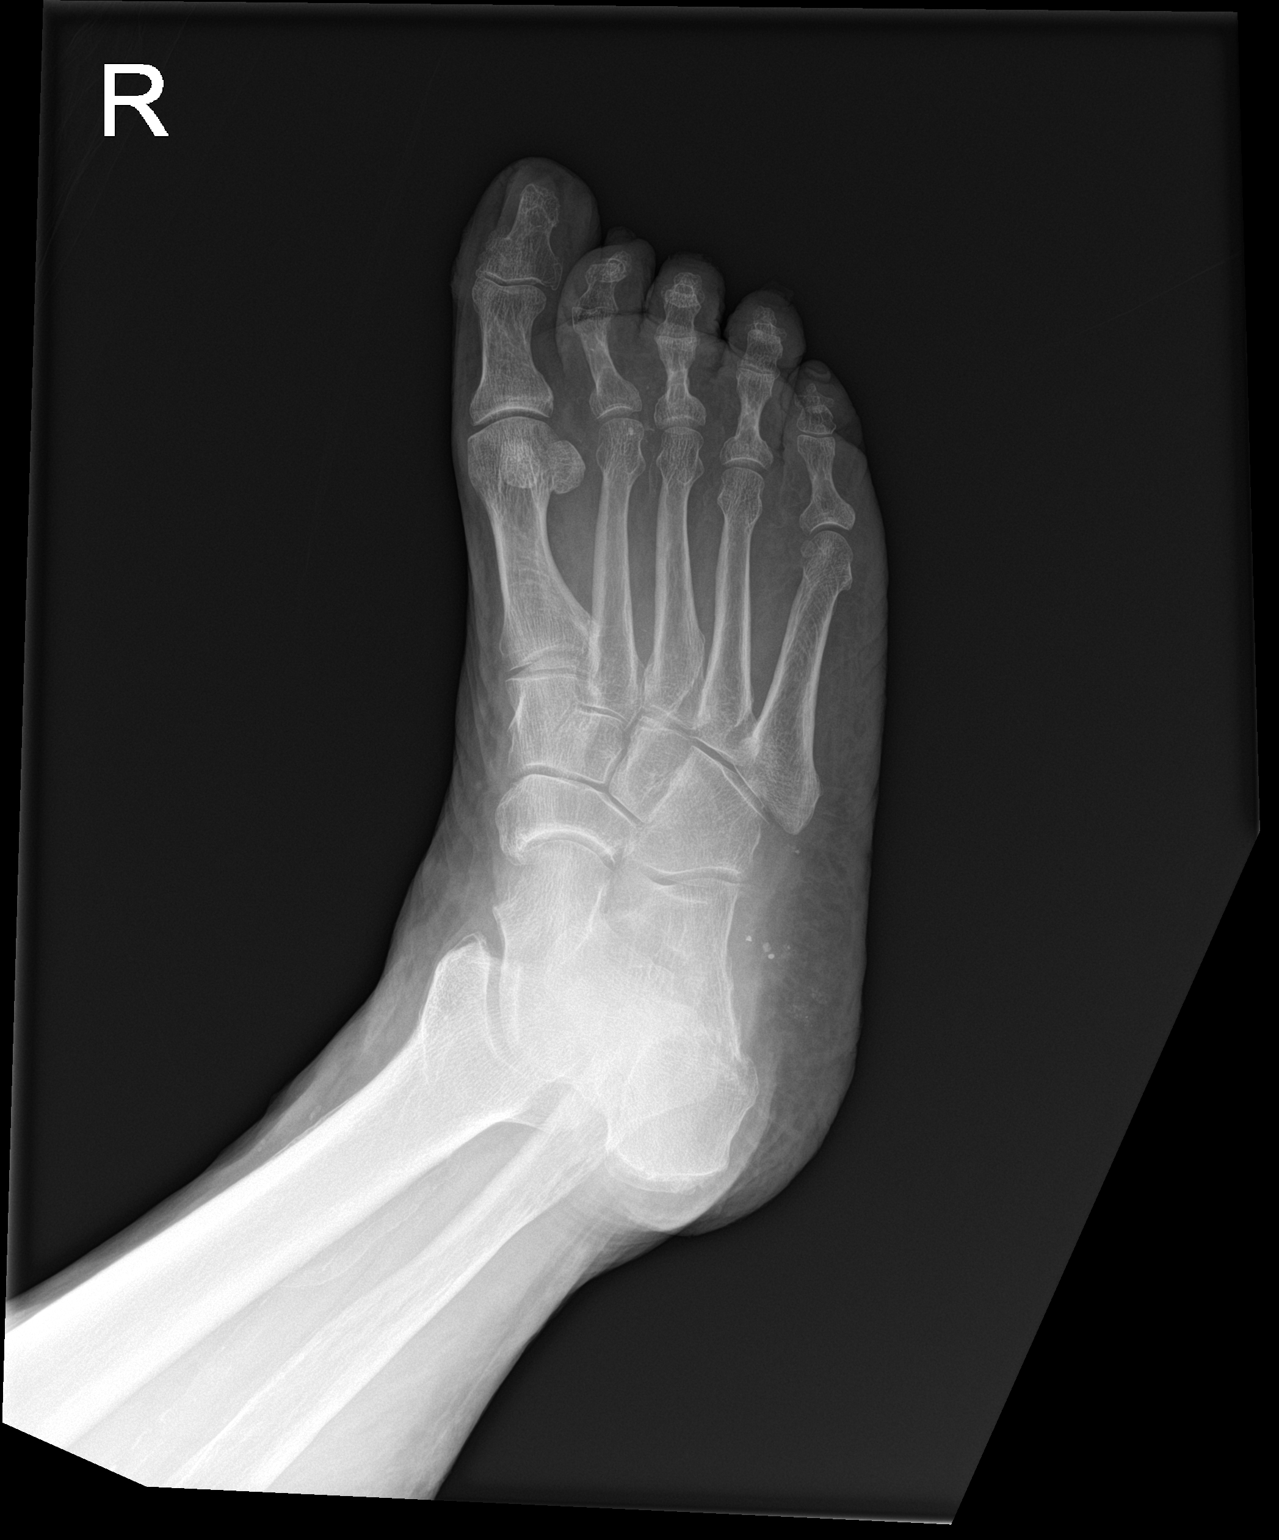

[foot lat]
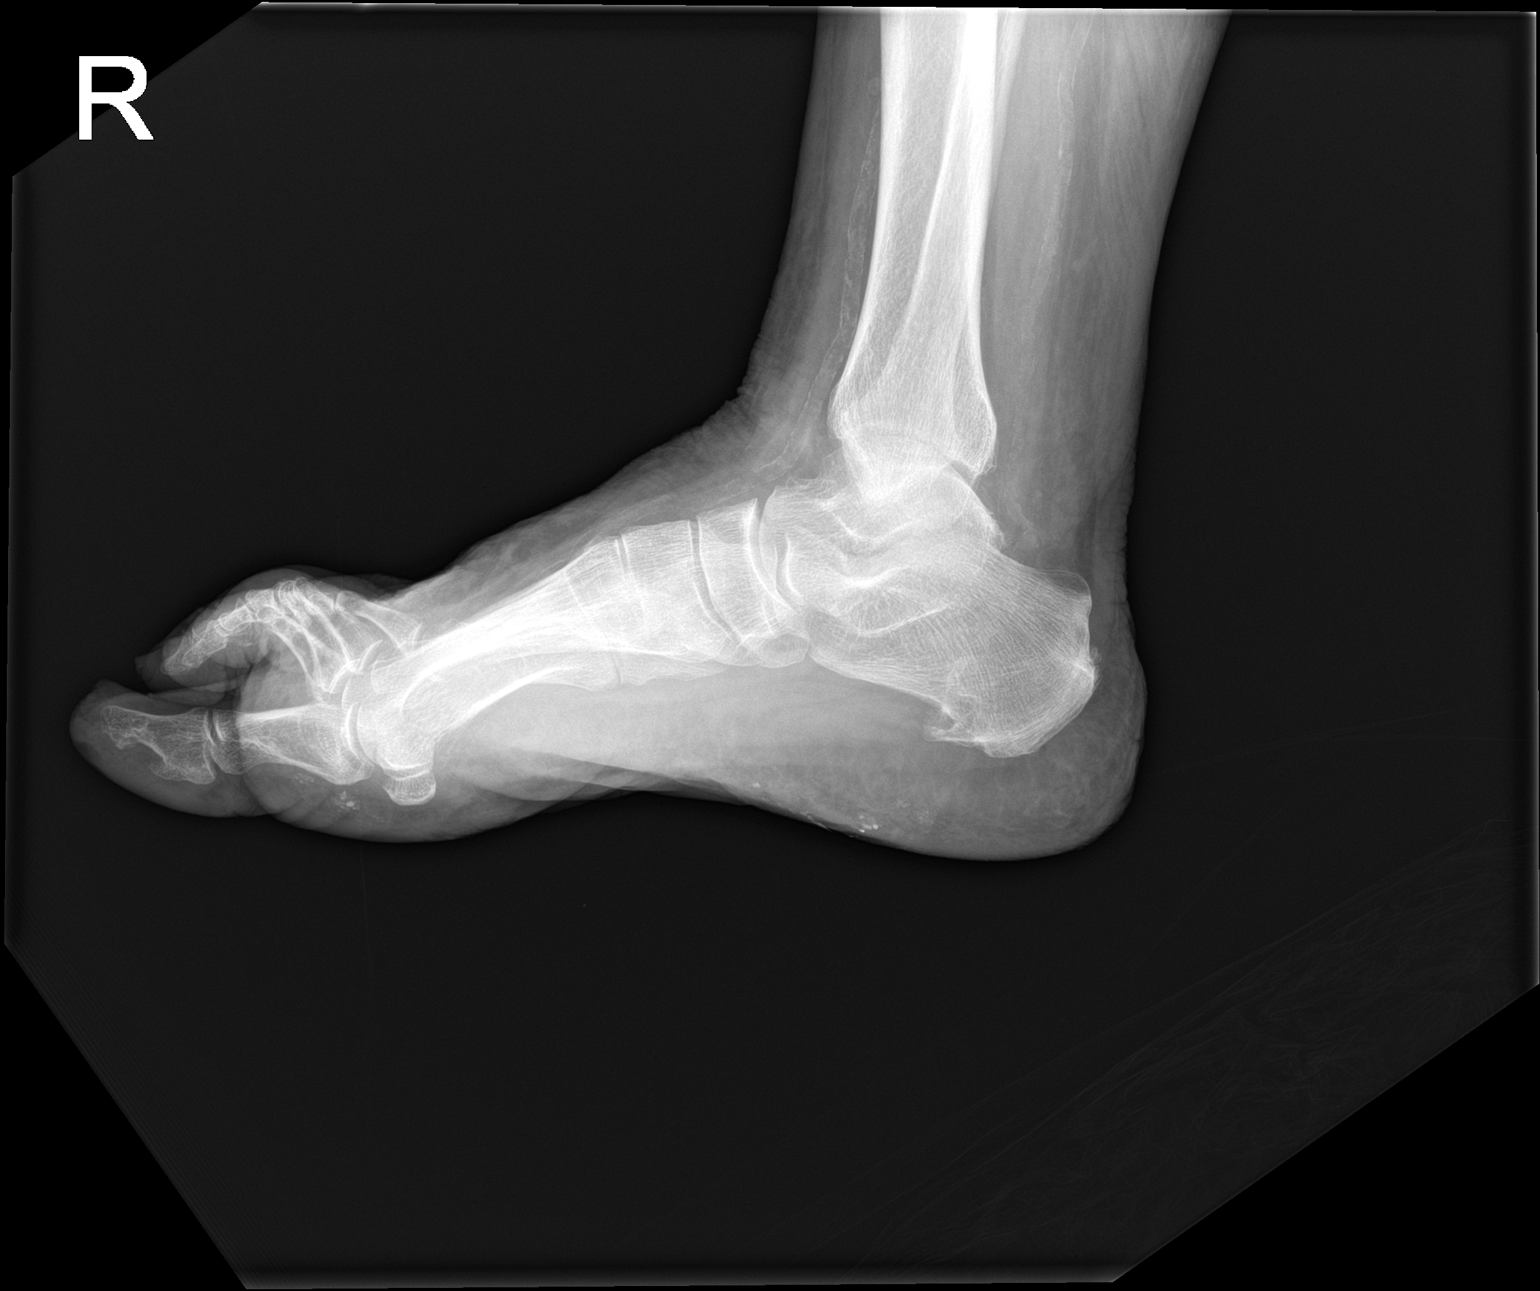

[3 of 3 positions shown; findings below may reference images not displayed]

FINDINGS: Negative for fracture or dislocation. Evidence for scattered soft
tissue calcifications. Vascular calcifications in the ankle and
lower leg. Prominent plantar calcaneal spur.
IMPRESSION: No acute bone abnormality to the right foot.

Prominent calcaneal spur.

## 2021-07-09 MED ORDER — DOXYCYCLINE HYCLATE 100 MG PO CAPS: 100.0000 mg | ORAL_CAPSULE | Freq: Two times a day (BID) | ORAL | 0 refills | Status: AC

## 2021-07-09 MED ORDER — ACETAMINOPHEN 500 MG PO TABS
1000.0000 mg | ORAL_TABLET | Freq: Once | ORAL | Status: AC
  Administered 2021-07-09: 1000 mg via ORAL
  Filled 2021-07-09: qty 2

## 2021-07-09 MED ORDER — POTASSIUM CHLORIDE CRYS ER 20 MEQ PO TBCR
40.0000 meq | EXTENDED_RELEASE_TABLET | Freq: Once | ORAL | Status: AC
  Administered 2021-07-09: 40 meq via ORAL
  Filled 2021-07-09: qty 2

## 2021-07-09 NOTE — ED Provider Notes (Signed)
[facility] Emergency Department Provider Note  ____________________________________________   Event Date/Time   First MD Initiated Contact with Patient 07/09/21 0827     (approximate)  I have reviewed the triage vital signs and the nursing notes.   HISTORY  Chief Complaint Dizziness and Atrial Fibrillation   HPI Evan [name] is a 65 y.o. male past medical history of A. fib on metoprolol and Xarelto, anxiety, asthma, CAD, CHF, COPD, DM, HTN, HPL, OSA, chronic back pain, chronic venous stasis, CVA, vertigo and anxiety who presents via EMS for assessment of multiple complaints.  It seems the pain is fine patient noticed today it is some soreness in his right foot that he states been ongoing for last 2 or 3 days.  States he feels his skin is very flaky and he has not been able to bend over to clean his feet because of his vertigo.  States he had some vertigo last night but does not currently feel dizzy.  States that vertigo has been going on at least several months years and is not any different last night than it has been otherwise.Marland Kitchen  He does note he had a headache started about 6 days ago but this comes and goes and he has had similar headaches going back several months.  He thinks he also bumped his right hand against some furniture or wall.  This morning as he is a little on the back of the hand.  Denies any other injuries from this and notes it is already scabbed over.  He denies any falls or injuries, fevers, chills, cough, vomiting, chest pain, back pain rash or other extremity pain.  He states he is working with a Education officer, museum to get a new home health aide to help him care of his feet this he recently lost his.  He denies any other acute concerns at this time.         Past Medical History:  Diagnosis Date   Anginal pain ([place])    Anxiety disorder    Asthma    Atresia of esophagus without fistula    CAD (coronary artery disease)    Cellulitis    CHF  (congestive heart failure) (HCC)    NYHA CLASS III,CHRONIC,DIASTOLIC   COPD (chronic obstructive pulmonary disease) ([place])    COVID-19    Diabetes mellitus without complication (HCC)    Edema    RIGHT LOWER LEG   Gastroesophageal reflux    H/O: GI bleed    History of pneumonia    Remote   History of scarlet fever    Childhood   Hyperlipidemia    Hypertension    Myocardial infarction ([place]) 2009   Obesity    Obstructive sleep apnea    Pain    CHRONIC BACK / ABDOMINAL   Panic disorder    Peripheral venous insufficiency    PTSD (post-traumatic stress disorder)    Retinopathy    DIABETIC   Stasis, venous    Stroke [facility])    Vertigo     Patient Active Problem List   Diagnosis Date Noted   Delusional disorder ([place]) 03/03/2021   Other thrombophilia ([place]) 12/25/2020   Vitamin D deficiency 12/04/2020   Vitamin B12 deficiency 12/04/2020   Stenosis of right carotid artery 12/03/2020   History of CVA (cerebrovascular accident) 11/06/2020   History of 2019 novel coronavirus disease (COVID-19) 09/27/2020   Atherosclerosis of aorta ([place]) 12/04/2019   Persistent atrial fibrillation ([place]) 09/12/2019   CAD (coronary artery  disease) 09/12/2019   Chronic venous stasis 09/12/2019   Hoarding disorder 09/12/2019   Cervical spinal stenosis 09/12/2019   Diabetic retinopathy ([place]) 09/12/2019   COPD, mild ([place]) 09/12/2019   Osteoporosis 09/09/2019   History of prostate cancer 09/09/2019   Anxiety 08/10/2019   Obstructive sleep apnea on CPAP 08/10/2019   Hyperlipidemia associated with type 2 diabetes mellitus ([place]) 08/10/2019   Chronic diastolic CHF (congestive heart failure) ([place]) 01/09/2014   Esophageal dysmotility 09/12/2013   Type 2 diabetes mellitus with morbid obesity ([place]) 02/14/2012   Obesity, morbid (more than 100 lbs over ideal weight or BMI > 40) ([place]) 07/26/2011   OLD MYOCARDIAL INFARCTION 03/02/2010   Hypertension associated with diabetes ([place]) 03/20/2009   CORONARY  ATHEROSCLEROSIS, ARTERY BYPASS GRAFT 03/20/2009    Past Surgical History:  Procedure Laterality Date   CARDIAC CATHETERIZATION     CATARACT EXTRACTION Left    CATARACT EXTRACTION W/PHACO Right 05/03/2017   Procedure: CATARACT EXTRACTION PHACO AND INTRAOCULAR LENS PLACEMENT ([employer]);  Surgeon: Leandrew Koyanagi, MD;  Location: [facility] ORS;  Service: Ophthalmology;  Laterality: Right;  [place] 00:35.3 AP% 12.3 CDE 4.33 Fluid Pack lot # [id number]        COLONOSCOPY WITH PROPOFOL N/A 11/26/2020   Procedure: COLONOSCOPY WITH PROPOFOL;  Surgeon: Jonathon Bellows, MD;  Location: [facility] ENDOSCOPY;  Service: Gastroenterology;  Laterality: N/A;  ANNETTE TO PICK UP [phone]   CORONARY ANGIOPLASTY WITH STENT PLACEMENT  2002   CORONARY ANGIOPLASTY WITH STENT PLACEMENT  1999   CORONARY ARTERY BYPASS GRAFT     x7   ESOPHAGOGASTRODUODENOSCOPY N/A 09/19/2016   Procedure: ESOPHAGOGASTRODUODENOSCOPY (EGD);  Surgeon: Lollie Sails, MD;  Location: [facility] ENDOSCOPY;  Service: Endoscopy;  Laterality: N/A;   ESOPHAGOGASTRODUODENOSCOPY (EGD) WITH PROPOFOL  11/26/2020   Procedure: ESOPHAGOGASTRODUODENOSCOPY (EGD) WITH PROPOFOL;  Surgeon: Jonathon Bellows, MD;  Location: [facility] ENDOSCOPY;  Service: Gastroenterology;;    Prior to Admission medications   Medication Sig Start Date End Date Taking? Authorizing Provider  doxycycline (VIBRAMYCIN) 100 MG capsule Take 1 capsule (100 mg total) by mouth 2 (two) times daily for 7 days. 07/09/21 07/16/21 Yes Lucrezia Starch, MD  acetaminophen (TYLENOL) 500 MG tablet Take 500 mg by mouth every 6 (six) hours as needed.    [provider]  albuterol (VENTOLIN HFA) 108 (90 Base) MCG/ACT inhaler Inhale 2 puffs into the lungs every 4 (four) hours as needed for wheezing or shortness of breath. 03/03/21   Ward, Delice Bison, DO  aspirin 81 MG chewable tablet Chew 1 tablet (81 mg total) by mouth daily. 11/23/20   [name], Henrine Screws T, NP  atorvastatin (LIPITOR) 80 MG tablet Take 1 tablet (80 mg  total) by mouth daily. 11/26/20 02/19/22  [name], Henrine Screws T, NP  Blood Glucose Monitoring Suppl (ONETOUCH VERIO) w/Device KIT Use to check blood sugar 3 to 4 times a day and document.  Please bring to visits for review. 07/08/20   [name] T, NP  Cholecalciferol 125 MCG (5000 UT) TABS Take 1 tablet (5,000 Units total) by mouth daily. 12/04/20   [name], NP  Elastic Bandages & Supports (MEDICAL COMPRESSION STOCKINGS) MISC 1 Package by Does not apply route daily. 08/28/20   Sable Feil, PA-C  empagliflozin (JARDIANCE) 10 MG TABS tablet Take 1 tablet (10 mg total) by mouth daily. 05/11/21   [name], Henrine Screws T, NP  ezetimibe (ZETIA) 10 MG tablet Take 1 tablet (10 mg total) by mouth daily. 05/11/21   [name], Henrine Screws T, NP  furosemide (LASIX) 40 MG  tablet TAKE 2 TABLETS BY MOUTH ONCE EVERY MORNING AND 1 TABLET ONCE EVERY EVENING 05/11/21   [name], [name] T, NP  glucose blood (ONETOUCH VERIO) test strip Use to check blood sugar 3 to 4 times a day. 07/08/20   [name], Henrine Screws T, NP  metoprolol succinate (TOPROL XL) 50 MG 24 hr tablet Take 1 tablet (50 mg total) by mouth daily. 05/11/21   [name], NP  MODERNA COVID-19 VACCINE 100 MCG/0.5ML injection  08/28/20   [provider]  nitroGLYCERIN (NITROSTAT) 0.4 MG SL tablet Place 1 tablet (0.4 mg total) under the tongue every 5 (five) minutes as needed for chest pain. 05/13/21   [name], Henrine Screws T, NP  nystatin (MYCOSTATIN/NYSTOP) powder Apply 1 application topically 3 (three) times daily. 05/11/21   [name], Henrine Screws T, NP  olmesartan (BENICAR) 5 MG tablet Take 2 tablets (10 mg total) by mouth daily. 12/03/20   [name], Henrine Screws T, NP  Omega-3 Fatty Acids (OMEGA 3 500 PO) Take 500 mg by mouth daily.    [provider]  OneTouch Delica Lancets 57W MISC Use to check blood sugar 2-3 times a day. 09/29/20   [name], Henrine Screws T, NP  pantoprazole (PROTONIX) 40 MG tablet Take 1 tablet (40 mg total) by mouth daily. 04/12/21   [name], Henrine Screws T, NP   predniSONE (DELTASONE) 20 MG tablet Take 3 tablets (60 mg total) by mouth daily. Start on 03/04/21 03/03/21   Ward, Delice Bison, DO  risperiDONE (RISPERDAL) 0.5 MG tablet Take 1 tablet (0.5 mg total) by mouth 2 (two) times daily. 05/11/21   [name], Henrine Screws T, NP  rivaroxaban (XARELTO) 20 MG TABS tablet Take 1 tablet (20 mg total) by mouth at bedtime. 07/08/20   [name] T, NP  Saccharomyces boulardii (PROBIOTIC) 250 MG CAPS Take 1 capsule by mouth in the morning and at bedtime. 05/31/21   Merlyn Lot, MD  sharps container 1 each by Does not apply route as needed. 09/29/20   [name], NP  Skin Protectants, Misc. (EUCERIN) cream Apply topically as needed for dry skin. 08/28/20   Sable Feil, PA-C  SYMBICORT 80-4.5 MCG/ACT inhaler INHALE 2 PUFFS BY MOUTH TWICE DAILY 05/10/21   [name], Henrine Screws T, NP  triamcinolone cream (KENALOG) 0.1 % APPLY TO AFFECTED AREAS TWICE DAILY 05/10/21   [name], Henrine Screws T, NP  vitamin B-12 (CYANOCOBALAMIN) 1000 MCG tablet Take 1 tablet (1,000 mcg total) by mouth daily. 12/04/20   [name] T, NP    Allergies Penicillin g, Sulfa antibiotics, Tiotropium, and Zoloft [sertraline hcl]  Family History  Problem Relation Age of Onset   Heart attack Mother    Hypertension Mother    Hyperlipidemia Mother    Heart attack Brother 59       MI   Coronary artery disease Other     Social History Social History   Tobacco Use   Smoking status: Never   Smokeless tobacco: Never  Vaping Use   Vaping Use: Never used  Substance Use Topics   Alcohol use: No   Drug use: No    Review of Systems  Review of Systems  Constitutional:  Negative for chills and fever.  HENT:  Negative for sore throat.   Eyes:  Negative for pain.  Respiratory:  Negative for cough and stridor.   Cardiovascular:  Negative for chest pain.  Gastrointestinal:  Negative for vomiting.  Musculoskeletal:  Positive for myalgias (R foot).  Skin:  Negative for rash.  Neurological:   Positive for dizziness  and headaches. Negative for seizures and loss of consciousness.  Psychiatric/Behavioral:  Negative for suicidal ideas.   All other systems reviewed and are negative.    ____________________________________________   PHYSICAL EXAM:  VITAL SIGNS: ED Triage Vitals  Enc Vitals Group     BP      Pulse      Resp      Temp      Temp src      SpO2      Weight      Height      Head Circumference      Peak Flow      Pain Score      Pain Loc      Pain Edu?      Excl. in [place]?    Vitals:   07/09/21 0830  BP: (!) 142/74  Pulse: 82  Resp: 16  Temp: 98 F (36.7 C)  SpO2: 95%   Physical Exam Vitals and nursing note reviewed.  Constitutional:      General: He is not in acute distress.    Appearance: He is well-developed.  HENT:     Head: Normocephalic and atraumatic.     Right Ear: External ear normal.     Left Ear: External ear normal.     Nose: Nose normal.  Eyes:     Conjunctiva/sclera: Conjunctivae normal.  Cardiovascular:     Rate and Rhythm: Normal rate. Rhythm irregular.     Heart sounds: No murmur heard. Pulmonary:     Effort: Pulmonary effort is normal. No respiratory distress.     Breath sounds: Normal breath sounds.  Abdominal:     Palpations: Abdomen is soft.     Tenderness: There is no abdominal tenderness.  Musculoskeletal:        General: No swelling.     Cervical back: Neck supple.  Skin:    General: Skin is warm and dry.     Capillary Refill: Capillary refill takes less than 2 seconds.  Neurological:     Mental Status: He is alert.  Psychiatric:        Mood and Affect: Mood normal.    Patient has full strength lower extremities including the right ankle and toes and right foot.  2+ DP pulses.  There is some erythema over the patient's lateral right lower leg just above the ankle varicose vein.  There is no effusion or deformity induration or abscess.  No pronator drift.  No finger dysmetria.  Cranial nerves II through XII are  grossly intact.  Patient has full strength and sensation to all extremities.  There Is a small scab in place in the back of the right hand.  No surrounding tenderness and sensation is intact distally including in the prescription of the radial ulnar and median nerves in the right hand.  Patient has full grip strength is able to flex and extend all digits.  No indication for closure at this time. ____________________________________________   LABS (all labs ordered are listed, but only abnormal results are displayed)  Labs Reviewed  COMPREHENSIVE METABOLIC PANEL - Abnormal; Notable for the following components:      Result Value   Potassium 3.4 (*)    Glucose, Bld 123 (*)    Calcium 8.7 (*)    All other components within normal limits  CBC WITH DIFFERENTIAL/PLATELET  MAGNESIUM  CK   ____________________________________________  EKG  ECG shows A. fib with a rate of 77 and some nonspecific changes inferior lateral leads  versus artifact with PVCs.  No other acute derangements noted. ____________________________________________  RADIOLOGY  ED MD interpretation: CT head has evidence of prior thalamic infarct without any evidence of hemorrhage or other acute process including ischemia or mass.  Plain film of right foot shows calcaneal spur without any other fracture dislocation or other acute process.  Official radiology report(s): CT HEAD WO CONTRAST (5MM)  Result Date: 07/09/2021 CLINICAL DATA:  65 year old male with headache. EXAM: CT HEAD WITHOUT CONTRAST TECHNIQUE: Contiguous axial images were obtained from the base of the skull through the vertex without intravenous contrast. COMPARISON:  Brain MRI and head CT 11/06/2020. FINDINGS: Brain: Expected evolution of the right thalamic lacunar infarcts since April. Stable cerebral volume. No midline shift, ventriculomegaly, mass effect, evidence of mass lesion, intracranial hemorrhage or evidence of cortically based acute infarction. Small  chronic right cerebellar infarct better demonstrated by MRI. Stable gray-white matter differentiation outside of the right thalamus. Vascular: Calcified atherosclerosis at the skull base. No suspicious intracranial vascular hyperdensity. Skull: No acute osseous abnormality identified. Sinuses/Orbits: Paranasal Visualized paranasal sinuses and mastoids are stable and well aerated. Other: No acute orbit or scalp soft tissue finding. IMPRESSION: 1. No acute intracranial abnormality. 2. Expected evolution of the right thalamic infarct in April. Additional chronic small vessel disease better demonstrated by MRI. Electronically Signed   By: Genevie Ann M.D.   On: 07/09/2021 09:38   DG Foot Complete Right  Result Date: 07/09/2021 CLINICAL DATA:  Right foot pain. EXAM: RIGHT FOOT COMPLETE - 3+ VIEW COMPARISON:  None. FINDINGS: Negative for fracture or dislocation. Evidence for scattered soft tissue calcifications. Vascular calcifications in the ankle and lower leg. Prominent plantar calcaneal spur. IMPRESSION: No acute bone abnormality to the right foot. Prominent calcaneal spur. Electronically Signed   By: Markus Daft M.D.   On: 07/09/2021 09:33    ____________________________________________   PROCEDURES  Procedure(s) performed (including Critical Care):  Procedures   ____________________________________________   INITIAL IMPRESSION / ASSESSMENT AND PLAN / ED COURSE      Patient above-stated history exam for assessment primarily concerning for some flakiness and soreness in his right foot.  He also notes that he has difficulty With cleaning due to some chronic intermittent vertigo that he had including episode last night and is also complaining of a headache that is a chronic last couple days.  He denies any recent trauma or injuries since the vertigo is the same as it has been episodic over the last several months.  He states his headache also similar to headaches he gets fairly frequently and will has  no objective deficits Evalose patient for CVA given his anticoagulated over 64 we will obtain a CT head to assess for possible SAH.  With regard to his foot there is erythema just above the ankle and is possible this is related to the pain he does state he is in his foot as he does not seem to have any other areas of significant redness tenderness or swelling in the foot itself.  Concern for possible cellulitis.  Low suspicion for DVT as patient is on Xarelto.  Advised patient that patient will continue to work with his Education officer, museum to get home health aide to help him keep his feet clean and that his PCP can refer him to podiatry feels he needs more regular foot care.  CT head has evidence of prior thalamic infarct without any evidence of hemorrhage or other acute process including ischemia or mass.  Plain film of right foot  shows calcaneal spur without any other fracture dislocation or other acute process.  CBC shows no leukocytosis or acute anemia.  EKG and nonelevated troponin not suggestive of atypical presentation for ACS versus arrhythmia.  CMP shows a K of 3.4 without any other significant electrolyte or metabolic derangements.  No clots.  CK not suggestive of rhabdomyolysis or myositis.  Suspect mild cellulitis of the right lower extremity.  Cover with course of Doxy as patient is allergic to penicillins.  Advised continued outpatient evaluation for his chronic vertigo.      ____________________________________________   FINAL CLINICAL IMPRESSION(S) / ED DIAGNOSES  Final diagnoses:  Chronic vertigo  Cellulitis of right leg  Contusion of right hand, initial encounter  Hypokalemia    Medications  acetaminophen (TYLENOL) tablet 1,000 mg (1,000 mg Oral Given 07/09/21 1026)  potassium chloride SA (KLOR-CON M) CR tablet 40 mEq (40 mEq Oral Given 07/09/21 1026)     ED Discharge Orders          Ordered    doxycycline (VIBRAMYCIN) 100 MG capsule  2 times daily        07/09/21 1027              Note:  This document was prepared using Dragon voice recognition software and may include unintentional dictation errors.    Lucrezia Starch, MD 07/09/21 1113

## 2021-07-09 NOTE — ED Triage Notes (Signed)
Pt arrives via EMS with reports of afib, dizziness and bilateral foot pain. Reports dizzy spells intermittently for a week that worsened this morning.

## 2021-07-11 ENCOUNTER — Telehealth: Payer: Self-pay | Admitting: Nurse Practitioner

## 2021-07-11 NOTE — Telephone Encounter (Signed)
Pt is calling to check on the status on the letter that pt is wanting [name] to write to his landlord regarding his account being frozen. Pt states that he is unable to pay his rent. And he does not want to be evicted. Thanks CB- [phone]

## 2021-07-11 NOTE — Telephone Encounter (Signed)
Pt called and stated that he had a CT in the hospital and would like to know the results. Pt states that he is feeling fine now. Pt would also like to know if a note has been written regarding his frozen bank account. I could not get a clear reason why it was frozen. Rent is due and will need to pay. He states that he will get kicked out if he does not pay. Could [facility] Management help with this. Please advise.

## 2021-07-12 ENCOUNTER — Telehealth: Payer: Medicare Other

## 2021-07-12 ENCOUNTER — Telehealth: Payer: Medicare Other | Admitting: General Practice

## 2021-07-12 ENCOUNTER — Ambulatory Visit: Payer: Self-pay

## 2021-07-12 DIAGNOSIS — E1159 Type 2 diabetes mellitus with other circulatory complications: Secondary | ICD-10-CM

## 2021-07-12 DIAGNOSIS — I152 Hypertension secondary to endocrine disorders: Secondary | ICD-10-CM

## 2021-07-12 DIAGNOSIS — I25118 Atherosclerotic heart disease of native coronary artery with other forms of angina pectoris: Secondary | ICD-10-CM

## 2021-07-12 DIAGNOSIS — E1169 Type 2 diabetes mellitus with other specified complication: Secondary | ICD-10-CM

## 2021-07-12 DIAGNOSIS — Z8673 Personal history of transient ischemic attack (TIA), and cerebral infarction without residual deficits: Secondary | ICD-10-CM

## 2021-07-12 DIAGNOSIS — F419 Anxiety disorder, unspecified: Secondary | ICD-10-CM

## 2021-07-12 DIAGNOSIS — F22 Delusional disorders: Secondary | ICD-10-CM

## 2021-07-12 DIAGNOSIS — F339 Major depressive disorder, recurrent, unspecified: Secondary | ICD-10-CM

## 2021-07-12 NOTE — Telephone Encounter (Signed)
Noted, [place] and Pam have been working with him at [employer] on social issues and we are working on ALF consideration with him as unsure he is consistently taking mood medications.

## 2021-07-12 NOTE — Telephone Encounter (Signed)
Copied from [place] [phone]. Topic: General - Call Back - No Documentation >> Jul 12, 2021 12:19 PM Erick Blinks wrote: Reason for CRM: Pt called and is requesting to speak to [name], says he is going to get evicted today and is requesting a community resource cell phone. Please advise

## 2021-07-12 NOTE — Chronic Care Management (AMB) (Signed)
Chronic Care Management   CCM RN Visit Note  07/12/2021 Name: Evan [name] MRN: [id number] DOB: 07-06-1956  Subjective: Evan [name] is a 65 y.o. year old male who is a primary care patient of [name], Henrine Screws T, NP. The care management team was consulted for assistance with disease management and care coordination needs.    Engaged with patient by telephone for follow up visit in response to provider referral for case management and/or care coordination services.   Consent to Services:  The patient was given information about Chronic Care Management services, agreed to services, and gave verbal consent prior to initiation of services.  Please see initial visit note for detailed documentation.   Patient agreed to services and verbal consent obtained.   Assessment: Review of patient past medical history, allergies, medications, health status, including review of consultants reports, laboratory and other test data, was performed as part of comprehensive evaluation and provision of chronic care management services.   SDOH (Social Determinants of Health) assessments and interventions performed:    CCM Care Plan  Allergies  Allergen Reactions   Penicillin G Hives   Sulfa Antibiotics Hives   Tiotropium    Zoloft [Sertraline Hcl] Other (See Comments)    Outpatient Encounter Medications as of 07/12/2021  Medication Sig   acetaminophen (TYLENOL) 500 MG tablet Take 500 mg by mouth every 6 (six) hours as needed.   albuterol (VENTOLIN HFA) 108 (90 Base) MCG/ACT inhaler Inhale 2 puffs into the lungs every 4 (four) hours as needed for wheezing or shortness of breath.   aspirin 81 MG chewable tablet Chew 1 tablet (81 mg total) by mouth daily.   atorvastatin (LIPITOR) 80 MG tablet Take 1 tablet (80 mg total) by mouth daily.   Blood Glucose Monitoring Suppl (ONETOUCH VERIO) w/Device KIT Use to check blood sugar 3 to 4 times a day and document.  Please bring to visits for review.    Cholecalciferol 125 MCG (5000 UT) TABS Take 1 tablet (5,000 Units total) by mouth daily.   doxycycline (VIBRAMYCIN) 100 MG capsule Take 1 capsule (100 mg total) by mouth 2 (two) times daily for 7 days.   Elastic Bandages & Supports (MEDICAL COMPRESSION STOCKINGS) MISC 1 Package by Does not apply route daily.   empagliflozin (JARDIANCE) 10 MG TABS tablet Take 1 tablet (10 mg total) by mouth daily.   ezetimibe (ZETIA) 10 MG tablet Take 1 tablet (10 mg total) by mouth daily.   furosemide (LASIX) 40 MG tablet TAKE 2 TABLETS BY MOUTH ONCE EVERY MORNING AND 1 TABLET ONCE EVERY EVENING   glucose blood (ONETOUCH VERIO) test strip Use to check blood sugar 3 to 4 times a day.   metoprolol succinate (TOPROL XL) 50 MG 24 hr tablet Take 1 tablet (50 mg total) by mouth daily.   MODERNA COVID-19 VACCINE 100 MCG/0.5ML injection  (Patient not taking: Reported on 06/28/2021)   nitroGLYCERIN (NITROSTAT) 0.4 MG SL tablet Place 1 tablet (0.4 mg total) under the tongue every 5 (five) minutes as needed for chest pain.   nystatin (MYCOSTATIN/NYSTOP) powder Apply 1 application topically 3 (three) times daily.   olmesartan (BENICAR) 5 MG tablet Take 2 tablets (10 mg total) by mouth daily.   Omega-3 Fatty Acids (OMEGA 3 500 PO) Take 500 mg by mouth daily.   OneTouch Delica Lancets 11X MISC Use to check blood sugar 2-3 times a day.   pantoprazole (PROTONIX) 40 MG tablet Take 1 tablet (40 mg total) by mouth daily.   predniSONE (  DELTASONE) 20 MG tablet Take 3 tablets (60 mg total) by mouth daily. Start on 03/04/21   risperiDONE (RISPERDAL) 0.5 MG tablet Take 1 tablet (0.5 mg total) by mouth 2 (two) times daily.   rivaroxaban (XARELTO) 20 MG TABS tablet Take 1 tablet (20 mg total) by mouth at bedtime.   Saccharomyces boulardii (PROBIOTIC) 250 MG CAPS Take 1 capsule by mouth in the morning and at bedtime.   sharps container 1 each by Does not apply route as needed.   Skin Protectants, Misc. (EUCERIN) cream Apply topically as  needed for dry skin.   SYMBICORT 80-4.5 MCG/ACT inhaler INHALE 2 PUFFS BY MOUTH TWICE DAILY   triamcinolone cream (KENALOG) 0.1 % APPLY TO AFFECTED AREAS TWICE DAILY   vitamin B-12 (CYANOCOBALAMIN) 1000 MCG tablet Take 1 tablet (1,000 mcg total) by mouth daily.   No facility-administered encounter medications on file as of 07/12/2021.    Patient Active Problem List   Diagnosis Date Noted   Delusional disorder ([place]) 03/03/2021   Other thrombophilia ([place]) 12/25/2020   Vitamin D deficiency 12/04/2020   Vitamin B12 deficiency 12/04/2020   Stenosis of right carotid artery 12/03/2020   History of CVA (cerebrovascular accident) 11/06/2020   History of 2019 novel coronavirus disease (COVID-19) 09/27/2020   Atherosclerosis of aorta ([place]) 12/04/2019   Persistent atrial fibrillation ([place]) 09/12/2019   CAD (coronary artery disease) 09/12/2019   Chronic venous stasis 09/12/2019   Hoarding disorder 09/12/2019   Cervical spinal stenosis 09/12/2019   Diabetic retinopathy ([place]) 09/12/2019   COPD, mild ([place]) 09/12/2019   Osteoporosis 09/09/2019   History of prostate cancer 09/09/2019   Anxiety 08/10/2019   Obstructive sleep apnea on CPAP 08/10/2019   Hyperlipidemia associated with type 2 diabetes mellitus ([place]) 08/10/2019   Chronic diastolic CHF (congestive heart failure) ([place]) 01/09/2014   Esophageal dysmotility 09/12/2013   Type 2 diabetes mellitus with morbid obesity ([place]) 02/14/2012   Obesity, morbid (more than 100 lbs over ideal weight or BMI > 40) ([place]) 07/26/2011   OLD MYOCARDIAL INFARCTION 03/02/2010   Hypertension associated with diabetes ([place]) 03/20/2009   CORONARY ATHEROSCLEROSIS, ARTERY BYPASS GRAFT 03/20/2009    Conditions to be addressed/monitored:CAD, HTN, HLD, Anxiety, Depression, and history of stroke and delusional disorder  Care Plan : RNCM: Coronary Artery Disease (Adult) and HLD  Updates made by [name], RN since 07/12/2021 12:00 AM  Completed 07/12/2021   Problem:  RNCM: Disease Progression (Coronary Artery Disease), HLD and history of stroke with residual effects Resolved 07/12/2021  Priority: Medium     Long-Range Goal: RNCM: Disease Progression Prevented or Minimized: CAD/HLD/ History of stroke with residual effects Completed 07/12/2021  Start Date: 10/15/2020  Expected End Date: 12/26/2021  Recent Progress: Not on track  Priority: Medium  Note:   Current Barriers: Resolving, duplicate goal Poorly controlled hyperlipidemia, complicated by anxiety, HF, HTN, past history of a stroke Current antihyperlipidemic regimen: Lipitor 80 mg QD, Zetia 10 mg QD Most recent lipid panel:     Component Value Date/Time   CHOL 275 (H) 05/11/2021 1115   CHOL 194 12/30/2013 0413   TRIG 96 05/11/2021 1115   TRIG 158 12/30/2013 0413   HDL 57 05/11/2021 1115   HDL 42 12/30/2013 0413   CHOLHDL 4.5 11/07/2020 0437   VLDL 23 11/07/2020 0437   VLDL 32 12/30/2013 0413   LDLCALC 202 (H) 05/11/2021 1115   LDLCALC 120 (H) 12/30/2013 0413   LDLDIRECT 178.5 07/26/2011 1234   ASCVD risk enhancing conditions: age >46, DM, HTN,  CHF, past history of a stroke Unable to independently manage HLD, CAD, and history of stroke with residual effects Lacks social connections Does not maintain contact with provider office Does not contact provider office for questions/concerns RN Care Manager Clinical Goal(s):  patient will work with Consulting civil engineer, providers, and care team towards execution of optimized self-health management plan patient will verbalize understanding of plan for effective management of HLD, CAD, and history of strokes  patient will work with [facility] and pcp  to address needs related to HLD,  CAD, and history of strokes  patient will attend all scheduled medical appointments: 08-12-2021 at 0920 am encouraged the patient to write down questions for the pcp. Also reminded the patient to make sure his transportation was in place. The patient states he has caregivers and has  told them about his appointment.  Interventions: Collaboration with [name], NP regarding development and update of comprehensive plan of care as evidenced by provider attestation and co-signature Inter-disciplinary care team collaboration (see longitudinal plan of care) Medication review performed; medication list updated in electronic medical record.  Inter-disciplinary care team collaboration (see longitudinal plan of care) Referred to pharmacy team for assistance with HLD, CAD, and history of stroke  medication management Evaluation of current treatment plan related to HLD, CAD, history of stroke and patient's adherence to plan as established by provider. 01-04-2021: The patient was in the ER on 12-08-2020 for dehydration. States he feels it is because of the colonoscopy prep he had to do and having to have it done 3 times before completion. The patient blames a lot of his issues on his former caregiver "Annette".  Had to redirect the patient several times during the call. Education on writing down questions to ask at the pcp and cardiology appointments. 03-01-2021: the patient has had 2 visits to the ER in July for palpitations and he says it is due to the stress he is under at his home with people coming and eviction notices. He has a new caregiver Enid Derry but it could not be determined how often Enid Derry comes. Redirection of conversation has to be done several times during the call. The patient states he has seen the cardiologist since he went to the ER but there is no record found and he can not give any information from follow up. Recommended he come in to see the pcp for follow up. 05-03-2021: The patient states that he has a lot of memory issues due to the stroke he had. The patient forgets things easily and repeats himself. He has to be redirected frequently. He states that he has a calendar now that he writes down things so he will not forget. Denies any issues with dietary restrictions or  taking medications as directed. States the foot pain that  he has had and seen in the ER for is much better. Will continue to monitor. 05-19-2021: The patient with elevation of Lipid panel. The LCSW reached out to the [facility] and stated that the patient is requesting information for cholesterol lowering foods and how to help with getting his cholesterol level down. Discussed with the patient the importance of medications compliance. The patient states he has a new bubble package system from [facility] that was delivered 3 days ago. He has a lock box system because he feels someone is coming into his apartment and taking his medications. Reviewed with the patient today the extreme importance of taking his medications as directed.  He did state he had not  been taking his medications because they "got gone", but now he does have them and is compliant with them. Also extensive education on foods that he can eat that will help with lowering cholesterol. Instructed the patient to check his mail as the [facility] has sent printed material by [place] mail on foods he can eat that will help with managing his cholesterol more effectively. Will also send the patient information by my chart system as he has reset his my chart today. He states someone was getting in his mychart and messing with his record. He is going to lock his information up.  Advised patient to call the office for changes in conditions or questions.  Provided education to patient re: heart healthy diet and weight loss. 01-04-2021: The patients states weight is 224 today. States he is not having issues with edema or swelling. States he is staying hydrated and eating well. Does say he gets easily short of breath with activity and can not do things like he normally does. Education on pacing activity and doing things in short time blocks then resting before resuming activity. 03-01-2021: The patient denies any swelling in his feet or legs but states he has bleeding from  his legs due to "veins busting". States that he is fearful of having another stroke due to the stress he is under. The patient easily gets agitated and has to be redirected. Offered solutions to his issues but the patient continues to revert back to agitation and saying nobody wants to help him. CCM team has been active in his care, including the care guide team providing resources for transportation and other resources in [place]. 05-03-2021: The patient states he does not have any edema in his feet and legs. Reminded the patient to discuss any concerns about his chronic conditions with the pcp at upcoming appointment. 05-19-2021: Reviewed the information sent to the patient in the mail. Advised the patient of the colorful food chart with heart healthy/Ada food options and easy to read information.  Also reviewed the printed information sent in the mail called "Can Foods or Supplements Lower Cholesterol? Advised the patient to check his mail for the packet that was sent to him for recall and use in helping with dietary options. The patient states that he went to a cookout and ate a lot of the wrong things. Discussed that one cookout would not cause his numbers to be off that bad that several wrong choices could impact his health in a negative way. Extensive review of managing his cholesterol and heart health and if he did not it put him at a greater risk of heart attack and stroke. The patient verbalized understanding.  Reviewed medications with patient and discussed compliance. The patient has started taking Zetia 10 mg daily to his Crestor 40 mg.  The patient verbalized understanding. 05-03-2021: States compliance with medications. 05-19-2021: The patient admits that he was not being compliant with his Lipitor 80 mg and Zetia 10 mg because someone came in his apartment and "took" his medications. The patient received a new package from [place] and has a new bubble package and states he is now  compliant with his medications. Expressed the importance of compliance with medications and how not being compliant with the plan of care puts him at increase risk of heart attack and stroke.  Reviewed scheduled/upcoming provider appointments including: 08-12-2021 at 0920 am Discussed plans with patient for ongoing care management follow up and provided patient with direct contact information for  care management team Patient Goals/Self-Care Activities: - call for medicine refill 2 or 3 days before it runs out - call if I am sick and can't take my medicine - keep a list of all the medicines I take; vitamins and herbals too - learn to read medicine labels - use a pillbox to sort medicine - use an alarm clock or phone to remind me to take my medicine - change to whole grain breads, cereal, pasta - drink 6 to 8 glasses of water each day - eat 3 to 5 servings of fruits and vegetables each day - eat 5 or 6 small meals each day - fill half the plate with nonstarchy vegetables - limit fast food meals to no more than 1 per week - manage portion size - prepare main meal at home 3 to 5 days each week - read food labels for fat, fiber, carbohydrates and portion size - set a realistic goal - be open to making changes - I can manage, know and watch for signs of a heart attack - if I have chest pain, call for help - learn about small changes that will make a big difference - learn my personal risk factors - barriers to treatment adherence reviewed and addressed - difficulty of making life-long changes acknowledged - functional limitation screening reviewed - healthy lifestyle promoted - medication-adherence assessment completed - medication side effects managed - rescue (action) plan developed - response to pharmacologic therapy monitored - self-awareness of signs/symptoms of worsening disease encouraged Follow Up Plan: Telephone follow up appointment with care management team member scheduled for:  07-12-2021 at 0900 am      Care Plan : RNCM: Hypertension (Adult)  Updates made by [name], RN since 07/12/2021 12:00 AM  Completed 07/12/2021   Problem: RNCM: Hypertension (Hypertension) Resolved 07/12/2021  Priority: Medium     Long-Range Goal: RNCM: Hypertension Monitored Completed 07/12/2021  Start Date: 10/15/2020  Expected End Date: 02/24/2022  Recent Progress: On track  Priority: Medium  Note:   Objective: Resolving, duplicate goal Last practice recorded BP readings:  BP Readings from Last 3 Encounters:  04/10/21 131/82  03/03/21 (!) 145/92  02/19/21 (!) 172/87   Most recent eGFR/CrCl:  Lab Results  Component Value Date   EGFR 76 02/08/2021    No components found for: CRCL Current Barriers:  Knowledge Deficits related to basic understanding of hypertension pathophysiology and self care management Knowledge Deficits related to understanding of medications prescribed for management of hypertension Non-adherence to prescribed medication regimen Non-adherence to scheduled provider appointments Transportation barriers Cognitive Deficits Limited Social Designer, multimedia.  Unable to independently manage HTN and other chronic conditions  Unable to self administer medications as prescribed Does not adhere to provider recommendations re:  Does not attend all scheduled provider appointments Does not adhere to prescribed medication regimen Does not adhere to prescribed psychotropic medication regimen Lacks social connections Unable to perform IADLs independently Does not maintain contact with provider office Does not contact provider office for questions/concerns Case Manager Clinical Goal(s):  patient will verbalize understanding of plan for hypertension management patient will attend all scheduled medical appointments: 05-11-2021, reminded the patient of his upcoming appointment  patient will demonstrate improved adherence to prescribed treatment plan for  hypertension as evidenced by taking all medications as prescribed, monitoring and recording blood pressure as directed, adhering to low sodium/DASH diet patient will demonstrate improved health management independence as evidenced by checking blood pressure as directed and notifying PCP if SBP>160 or DBP >  90, taking all medications as prescribe, and adhering to a low sodium diet as discussed. patient will verbalize basic understanding of hypertension disease process and self health management plan as evidenced by compliance with medications, compliance with heart heatlhy/ADA diet, and working with the CCM team to manage health and well being.  Interventions:  Collaboration with [name], NP regarding development and update of comprehensive plan of care as evidenced by provider attestation and co-signature Inter-disciplinary care team collaboration (see longitudinal plan of care) UNABLE to independently:HTN and other chronic health conditions.  Evaluation of current treatment plan related to hypertension self management and patient's adherence to plan as established by provider. 03-01-2021: The patient has had 2 ER visits in July due to palpitations and "bleeding" from his legs and feet. The patient states he has been upset over his living arrangements and his landlord trying to evict him. He states he is compliant with what they have asking him to do. This has been an ongoing event for several months.  The patient has had a stroke in the past and states he is fearful of having another stroke. Education on remaining calm and working through the things that upset him. Patient states he has seen the cardiologist but can not tell RNCM the outcome of the appointment and no notes found from cardiologist. Long standing history with this patient with non-compliance. Recommended that he see pcp for follow up before 05-11-2021. 05-03-2021: The patient states that he knows about his upcoming appointments with the  pcp  Provided education to patient re: stroke prevention, s/s of heart attack and stroke, DASH diet, complications of uncontrolled blood pressure Reviewed medications with patient and discussed importance of compliance. 05-03-2021: Patient states compliance with medications  Discussed plans with patient for ongoing care management follow up and provided patient with direct contact information for care management team Advised patient, providing education and rationale, to monitor blood pressure daily and record, calling PCP for findings outside established parameters.  Reviewed scheduled/upcoming provider appointments including: 05-11-2021, next scheduled appointment.  Recommended the patient see the pcp sooner due to ER visits x 2 in July. The patient is receptive to office calling and assisting with a follow up post ER visit with pcp. Will collaborate with the front office staff for assisting patient with a follow up post ER with pcp. 05-03-2021: Reminded the patient of his appointment today and encouraged him to keep his appointment with the pcp. The patient verbalized understanding. Patient has long standing history of non-compliance with appointments.  Self-Care Activities: - Self administers medications as prescribed Attends all scheduled provider appointments Calls provider office for new concerns, questions, or BP outside discussed parameters Checks BP and records as discussed Follows a low sodium diet/DASH diet Patient Goals: - check blood pressure weekly - choose a place to take my blood pressure (home, clinic or office, retail store) - write blood pressure results in a log or diary - agree on reward when goals are met - agree to work together to make changes - ask questions to understand - have a family meeting to talk about healthy habits - learn about high blood pressure  Follow Up Plan: Telephone follow up appointment with care management team member scheduled for: 07-12-2021 at 0900  am    Care Plan : RNCM: Anxiety and depression  Updates made by [name], RN since 07/12/2021 12:00 AM  Completed 07/12/2021   Problem: RNCM: Anxiety and depression Resolved 07/12/2021  Priority: Medium  Long-Range Goal: RNCM: Anxiety and depression Completed 07/12/2021  Start Date: 10/15/2020  Expected End Date: 02/24/2022  Recent Progress: On track  Priority: Medium  Note:   Current Barriers: Resolving, duplicate goal  Knowledge Deficits related to resources for anxiety/depression and meeting the patients needs  Chronic Disease Management support and education needs related to effective management of anxiety and depression Lacks caregiver support. 01-04-2021: The patient is requesting a new caregiver. The patient states he is working with a Hotel manager" and talking to [employer]. Will collaborate with the LCSW concerning patient needs. 03-01-2021: Has a new caregiver- Enid Derry who is working well for him at this time. 05-03-2021: Has new caregivers: [name] and Pam currently working with the patient. 05-19-2021: States that only [name] is working with him now and she did not come today as she was supposed to.  Lacks social connections Does not maintain contact with provider office Does not contact provider office for questions/concerns Frequent visits to the ER  Nurse Case Manager Clinical Goal(s):  patient will verbalize understanding of plan for effective management of anxiety patient will work with [facility], CCM team and pcp  to address needs related to effective management of anxiety  patient will attend all scheduled medical appointments: 08-12-2021 at 0920 am  Interventions:  1:1 collaboration with [name], NP regarding development and update of comprehensive plan of care as evidenced by provider attestation and co-signature Inter-disciplinary care team collaboration (see longitudinal plan of care) Evaluation of current treatment plan related to anxiety  and patient's  adherence to plan as established by provider. 01-04-2021: The patient is anxious about past caregiver and his living arrangements. He was at a friends house that lives near him. He states that he is waiting for Scottie to get him a new caregiver. Has talked with Jasmine the LCSW and also working with her. Empathetic listening and support give. Encouraged the patient to discuss his feelings with his support system and new social worker that comes in home. Education on keeping appointments and following recommendations of the pcp and other providers. 03-01-2021: The patient was very upset today and states that all his problems steam from the constant things he has to deal with as his landlord is constantly trying to get him evicted. He has been through several

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
## 2021-07-13 NOTE — Telephone Encounter (Signed)
Pt called stating that he is not sure about assisted living facility. He states that he has checks coming in and that he has obligations to creditors. Pt is requesting to have PCP give a call back to go over details. Pt also states that he was able to get his antibiotics this morning. Please advise.

## 2021-07-13 NOTE — Telephone Encounter (Signed)
Pt stated he would like to go ahead with assisted living, please advise.

## 2021-07-14 ENCOUNTER — Ambulatory Visit: Payer: Self-pay

## 2021-07-14 NOTE — Telephone Encounter (Signed)
Pt needed transportation and a morning appt in order to have an appt. Set pt an appt on Tuesday 12/13 at 8:20 am with Lauren and I called to set up his transportation.

## 2021-07-14 NOTE — Telephone Encounter (Signed)
2nd attempt. Pt called, left VM to call back.
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
## 2021-07-14 NOTE — Telephone Encounter (Signed)
3rd attempt, pt called, left VM to call back and speak with a nurse. Routing to office per protocol.

## 2021-07-14 NOTE — Telephone Encounter (Signed)
Reason for Disposition . Looks like a boil, infected sore, deep ulcer or other infected rash (spreading redness, pus)  Answer Assessment - Initial Assessment Questions 1. ONSET: "When did the swelling start?" (e.g., minutes, hours, days)     Several weeks  2. LOCATION: "What part of the leg is swollen?"  "Are both legs swollen or just one leg?"     Lower loegs 3. SEVERITY: "How bad is the swelling?" (e.g., localized; mild, moderate, severe)  - Localized - small area of swelling localized to one leg  - MILD pedal edema - swelling limited to foot and ankle, pitting edema < 1/4 inch (6 mm) deep, rest and elevation eliminate most or all swelling  - MODERATE edema - swelling of lower leg to knee, pitting edema > 1/4 inch (6 mm) deep, rest and elevation only partially reduce swelling  - SEVERE edema - swelling extends above knee, facial or hand swelling present      mild 4. REDNESS: "Does the swelling look red or infected?"     yes 5. PAIN: "Is the swelling painful to touch?" If Yes, ask: "How painful is it?"   (Scale 1-10; mild, moderate or severe)     na 6. FEVER: "Do you have a fever?" If Yes, ask: "What is it, how was it measured, and when did it start?"      NO 7. CAUSE: "What do you think is causing the leg swelling?"     cellulitis 8. MEDICAL HISTORY: "Do you have a history of heart failure, kidney disease, liver failure, or cancer?"     no 9. RECURRENT SYMPTOM: "Have you had leg swelling before?" If Yes, ask: "When was the last time?" "What happened that time?"     yes 10. OTHER SYMPTOMS: "Do you have any other symptoms?" (e.g., chest pain, difficulty breathing)       Feet are dark colored and skin calloused  Protocols used: Leg Swelling and Edema-A-AH

## 2021-07-14 NOTE — Telephone Encounter (Signed)
Noted  

## 2021-07-14 NOTE — Telephone Encounter (Signed)
  Chief Complaint: cellulitis Symptoms: leg swelling, redness, and feet scaly/discolored Frequency: several weeks Pertinent Negatives: Patient denies pain or discomfort Disposition: [] ED /[] Urgent Care (no appt availability in office) / [x] Appointment(In office/virtual)/ []  [facility]/ [] Home Care/ [] Refused Recommended Disposition  Additional Notes: pt scheduled appt via telephone 07/15/21 at 0900 with Dr. Neomia Dear. Canceled appt for 07/19/21 d/t pt needing to be seen sooner and unable to get transportation set up without several days notice. Pt wants to be called on his cell [phone] for visit. He is unsure if his camera works.

## 2021-07-14 NOTE — Telephone Encounter (Signed)
Patient called, left VM to return the call to the office to discuss symptoms with a nurse.    Message from Nani Ravens sent at 07/14/2021  9:40 AM EST  Pt called in for assistance, while speaking with pt he mentioned that he has cellulitis and is experiencing some swelling in both of his legs. Pt says that his veins has burst but isn't experiencing any blood. Pt says that his legs are bluish purple. Pt feels that he could also possibly be dehydrated

## 2021-07-15 ENCOUNTER — Telehealth: Payer: Self-pay

## 2021-07-15 ENCOUNTER — Telehealth: Payer: Medicare Other | Admitting: Internal Medicine

## 2021-07-15 NOTE — Telephone Encounter (Signed)
Pt looks like he scheduled a MyChart visit this morning with Dr. Neomia Dear.

## 2021-07-15 NOTE — Telephone Encounter (Signed)
Called patient to check in for today's appointment with Dr. Neomia Dear for cellulitis. PAtient states he does not need to be seen today. States he has been using cream, walking more, and taking an antibiotic so his legs are better. Double checked with patient that he would like his appointment cancelled. Patient verbalized again that he did not need to be seen today.

## 2021-07-19 ENCOUNTER — Telehealth: Payer: Self-pay | Admitting: Nurse Practitioner

## 2021-07-19 ENCOUNTER — Ambulatory Visit: Payer: Medicare Other | Admitting: Nurse Practitioner
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
## 2021-07-21 ENCOUNTER — Ambulatory Visit: Payer: Medicare Other | Admitting: Nurse Practitioner

## 2021-07-21 ENCOUNTER — Telehealth: Payer: Self-pay | Admitting: Nurse Practitioner

## 2021-07-21 NOTE — Telephone Encounter (Signed)
Copied from [place] [phone]. Topic: General - Other >> Jul 21, 2021  8:05 AM [name] wrote: Reason for CRM: pt wanted to tell [name] that the EMS came to his home yesterday / he thought he was having mild afib / he was having vertigo /he staed he was ok and didn't have to go to the hospital / please advise if needed

## 2021-07-25 ENCOUNTER — Telehealth: Payer: Self-pay | Admitting: Nurse Practitioner

## 2021-07-25 ENCOUNTER — Ambulatory Visit: Payer: Medicare Other | Admitting: Nurse Practitioner
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
## 2021-07-27 ENCOUNTER — Ambulatory Visit: Payer: Self-pay | Admitting: *Deleted
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
## 2021-07-27 NOTE — Telephone Encounter (Signed)
Update for you all.  Pam and Jasmine if you could staff message Alwyn Ren some of the work and discussions you have had with patient, as I know we have all been working frequently with him.

## 2021-07-28 ENCOUNTER — Telehealth: Payer: Self-pay | Admitting: Nurse Practitioner

## 2021-07-28 ENCOUNTER — Ambulatory Visit (INDEPENDENT_AMBULATORY_CARE_PROVIDER_SITE_OTHER): Payer: Medicare Other | Admitting: Licensed Clinical Social Worker

## 2021-07-28 DIAGNOSIS — F419 Anxiety disorder, unspecified: Secondary | ICD-10-CM

## 2021-07-28 DIAGNOSIS — E1159 Type 2 diabetes mellitus with other circulatory complications: Secondary | ICD-10-CM

## 2021-07-28 DIAGNOSIS — I152 Hypertension secondary to endocrine disorders: Secondary | ICD-10-CM

## 2021-07-28 DIAGNOSIS — I4819 Other persistent atrial fibrillation: Secondary | ICD-10-CM

## 2021-07-28 DIAGNOSIS — F200 Paranoid schizophrenia: Secondary | ICD-10-CM

## 2021-07-28 DIAGNOSIS — I25118 Atherosclerotic heart disease of native coronary artery with other forms of angina pectoris: Secondary | ICD-10-CM

## 2021-07-28 DIAGNOSIS — I5032 Chronic diastolic (congestive) heart failure: Secondary | ICD-10-CM

## 2021-07-28 DIAGNOSIS — M810 Age-related osteoporosis without current pathological fracture: Secondary | ICD-10-CM
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
## 2021-07-28 NOTE — Addendum Note (Signed)
Addended by: [name] on: 07/28/2021 06:55 PM   Modules accepted: Orders

## 2021-07-28 NOTE — Patient Instructions (Signed)
Visit Information  Thank you for taking time to visit with me today. Please don't hesitate to contact me if I can be of assistance to you before our next scheduled telephone appointment.  Following are the goals we discussed today:  Patient Goals/Self-Care Activities: Over the next 120 days Attend all scheduled appointments with providers Contact office with any questions or concerns Utilize [facility] transportation number to assist with attending medical appointments Practice relaxation or meditation daily Practice positive thinking and self-talk  Our next appointment is by telephone on 08/19/21 at 10:45 AM  Please call the care guide team at [phone] if you need to cancel or reschedule your appointment.   If you are experiencing a Mental Health or [place] or need someone to talk to, please call the Suicide and Crisis Lifeline: 988 call 911   Patient verbalizes understanding of instructions provided today and agrees to view in [place].   [name], MSW, [email or web address] Phone [phone] 10:28 AM

## 2021-07-29 NOTE — Chronic Care Management (AMB) (Signed)
Chronic Care Management    Clinical Social Work Note  07/29/2021 Name: Evan [name] MRN: [id number] DOB: March 07, 1956  Evan [name] is a 65 y.o. year old male who is a primary care patient of [name], [name], NP. The CCM team was consulted to assist the patient with chronic disease management and/or care coordination needs related to: Mental Health Counseling and Resources.   Engaged with patient by telephone for follow up visit in response to provider referral for social work chronic care management and care coordination services.   Consent to Services:  The patient was given information about Chronic Care Management services, agreed to services, and gave verbal consent prior to initiation of services.  Please see initial visit note for detailed documentation.   Patient agreed to services and consent obtained.   Consent to Services:  The patient was given information about Care Management services, agreed to services, and gave verbal consent prior to initiation of services.  Please see initial visit note for detailed documentation.   Patient agreed to services today and consent obtained.  Engaged with patient by phone in response to provider referral for social work care coordination services:  Assessment/Interventions:  Patient continues to experience difficulty with managing stress triggered by past events. CCM LCSW addressed reported concerns regarding bank fraud and alleged risks of eviction due to payment confusion. Patient is adamant that he is not interested in leaving current residence. States he feels safe in the home, despite reports of others having access to his home without his permission.  See Care Plan below for interventions and patient self-care actives.  Recent life changes or stressors: Management of health  Recommendation: Patient may benefit from, and is in agreement work with LCSW to address care coordination needs and will continue to work with the clinical  team to address health care and disease management related needs.   Follow up Plan: Patient would like continued follow-up from CCM LCSW.  per patient's request will follow up in 08/19/21.  Will call office if needed prior to next encounter.  SDOH (Social Determinants of Health) assessments and interventions performed:    Advanced Directives Status: Not addressed in this encounter.  CCM Care Plan  Allergies  Allergen Reactions   Penicillin G Hives   Sulfa Antibiotics Hives   Tiotropium    Zoloft [Sertraline Hcl] Other (See Comments)    Outpatient Encounter Medications as of 07/28/2021  Medication Sig   acetaminophen (TYLENOL) 500 MG tablet Take 500 mg by mouth every 6 (six) hours as needed.   albuterol (VENTOLIN HFA) 108 (90 Base) MCG/ACT inhaler Inhale 2 puffs into the lungs every 4 (four) hours as needed for wheezing or shortness of breath.   aspirin 81 MG chewable tablet Chew 1 tablet (81 mg total) by mouth daily.   atorvastatin (LIPITOR) 80 MG tablet Take 1 tablet (80 mg total) by mouth daily.   Blood Glucose Monitoring Suppl (ONETOUCH VERIO) w/Device KIT Use to check blood sugar 3 to 4 times a day and document.  Please bring to visits for review.   Cholecalciferol 125 MCG (5000 UT) TABS Take 1 tablet (5,000 Units total) by mouth daily.   Elastic Bandages & Supports (MEDICAL COMPRESSION STOCKINGS) MISC 1 Package by Does not apply route daily.   empagliflozin (JARDIANCE) 10 MG TABS tablet Take 1 tablet (10 mg total) by mouth daily.   ezetimibe (ZETIA) 10 MG tablet Take 1 tablet (10 mg total) by mouth daily.   furosemide (LASIX) 40 MG tablet  TAKE 2 TABLETS BY MOUTH ONCE EVERY MORNING AND 1 TABLET ONCE EVERY EVENING   glucose blood (ONETOUCH VERIO) test strip Use to check blood sugar 3 to 4 times a day.   metoprolol succinate (TOPROL XL) 50 MG 24 hr tablet Take 1 tablet (50 mg total) by mouth daily.   MODERNA COVID-19 VACCINE 100 MCG/0.5ML injection  (Patient not taking: Reported on  06/28/2021)   nitroGLYCERIN (NITROSTAT) 0.4 MG SL tablet Place 1 tablet (0.4 mg total) under the tongue every 5 (five) minutes as needed for chest pain.   nystatin (MYCOSTATIN/NYSTOP) powder Apply 1 application topically 3 (three) times daily.   olmesartan (BENICAR) 5 MG tablet Take 2 tablets (10 mg total) by mouth daily.   Omega-3 Fatty Acids (OMEGA 3 500 PO) Take 500 mg by mouth daily.   OneTouch Delica Lancets 32T MISC Use to check blood sugar 2-3 times a day.   pantoprazole (PROTONIX) 40 MG tablet Take 1 tablet (40 mg total) by mouth daily.   predniSONE (DELTASONE) 20 MG tablet Take 3 tablets (60 mg total) by mouth daily. Start on 03/04/21   risperiDONE (RISPERDAL) 0.5 MG tablet Take 1 tablet (0.5 mg total) by mouth 2 (two) times daily.   rivaroxaban (XARELTO) 20 MG TABS tablet Take 1 tablet (20 mg total) by mouth at bedtime.   Saccharomyces boulardii (PROBIOTIC) 250 MG CAPS Take 1 capsule by mouth in the morning and at bedtime.   sharps container 1 each by Does not apply route as needed.   Skin Protectants, Misc. (EUCERIN) cream Apply topically as needed for dry skin.   SYMBICORT 80-4.5 MCG/ACT inhaler INHALE 2 PUFFS BY MOUTH TWICE DAILY   triamcinolone cream (KENALOG) 0.1 % APPLY TO AFFECTED AREAS TWICE DAILY   vitamin B-12 (CYANOCOBALAMIN) 1000 MCG tablet Take 1 tablet (1,000 mcg total) by mouth daily.   No facility-administered encounter medications on file as of 07/28/2021.    Patient Active Problem List   Diagnosis Date Noted   Delusional disorder ([place]) 03/03/2021   Other thrombophilia ([place]) 12/25/2020   Vitamin D deficiency 12/04/2020   Vitamin B12 deficiency 12/04/2020   Stenosis of right carotid artery 12/03/2020   History of CVA (cerebrovascular accident) 11/06/2020   History of 2019 novel coronavirus disease (COVID-19) 09/27/2020   Atherosclerosis of aorta ([place]) 12/04/2019   Persistent atrial fibrillation ([place]) 09/12/2019   CAD (coronary artery disease) 09/12/2019    Chronic venous stasis 09/12/2019   Hoarding disorder 09/12/2019   Cervical spinal stenosis 09/12/2019   Diabetic retinopathy ([place]) 09/12/2019   COPD, mild ([place]) 09/12/2019   Osteoporosis 09/09/2019   History of prostate cancer 09/09/2019   Paranoid type schizophrenia ([place]) 08/10/2019   Obstructive sleep apnea on CPAP 08/10/2019   Hyperlipidemia associated with type 2 diabetes mellitus ([place]) 08/10/2019   Chronic diastolic CHF (congestive heart failure) ([place]) 01/09/2014   Esophageal dysmotility 09/12/2013   Type 2 diabetes mellitus with morbid obesity ([place]) 02/14/2012   Obesity, morbid (more than 100 lbs over ideal weight or BMI > 40) ([place]) 07/26/2011   OLD MYOCARDIAL INFARCTION 03/02/2010   Hypertension associated with diabetes ([place]) 03/20/2009   CORONARY ATHEROSCLEROSIS, ARTERY BYPASS GRAFT 03/20/2009    Conditions to be addressed/monitored: Anxiety; Limited social support and Mental Health Concerns   Care Plan : General Social Work (Adult)  Updates made by Rebekah [place], LCSW since 07/29/2021 12:00 AM     Problem: Response to Treatment (Depression)      Goal: Response to Treatment Maximized   Start  Date: 12/27/2020  This Visit's Progress: On track  Recent Progress: On track  Priority: High  Note:   Current barriers:   Acute Mental Health needs related to Anxiety Housing barriers, Level of care concerns, and Mental Health Concerns  Needs Support, Education, and Care Coordination in order to meet unmet mental health needs. Clinical Goal(s): Over the next 120 days, patient will work with SW to reduce or manage symptoms of anxiety and increase knowledge and/or ability of: coping skills, healthy habits, self-management skills, and stress reduction.until connected for ongoing counseling.  Clinical Interventions:  Assessed patient's previous treatment, needs, coping skills, current treatment, support system and barriers to care Patient interviewed and appropriate assessments  performed Provided mental health counseling with regard to managing mental health conditions Patient reports difficulty managing symptoms of anxiety triggered by chronic health conditions Concerns that rent isn't paid for. Concerns for not passing inspection and it needed to be completed 06/23/21 [name] is his leasing agent who contacted patient while CCM LCSW was on phone and shared that she will work with patient on meeting inspection. Scheduled to visit home on Tuesday, 06/28/21 11/28: Patient reports [name] has assisted him with passing the inspection Patient is upset about the way he is treated by multiple personal aids, stating that current aid is dishonest about why she does not show to work. CCM LCSW strongly encouraged patient to contact agency about his concerns to reach a resolution 09/08: [name] is back as his guide for 27 hrs a week [place] [phone] 09/26: Patient informed CCM LCSW that [name] will provide transportation for his upcoming PCP appt scheduled 05/11/21. This information was verified by [name], pt's new aid that will be assisting with ADL's in the home on Mondays, Wednesdays, and Fridays 11/1: Patient reports that he is no longer receiving services from [facility]. He has contacted [name] and is awaiting paperwork regarding home care agencies 11/8: Patient reports inability to work with [name] and her home aids. He has contacted [place] and requested to work with a new agency. Patient reports he has met with a new aid and will have an assessment completed within a week 11/28: Patient is not interested in ALF or SNF. He prefers to continue residing independently. CCM LCSW will follow up with pt regarding process of obtaining new aid through [facility] 11/30: Patient was strongly encouraged to re-consider ALF to provide improved socialization and support 12/22: CCM LCSW addressed reported concerns regarding bank fraud and alleged risks of eviction due to payment  confusion. Patient is adamant that he is not interested in leaving current residence. States he feels safe in the home, despite reports of others having access to his home without his permission Patient receives visits from neighbors which promotes mood CCM LCSW discussed CBT strategies to assist with managing symptoms  Patient reports recent attempt for bank fraud. The bank restored his account and are sending new cards. Patient identified strategies to prevent future scams, including, locking his belongings in a safe at home, feels previous aids were stealing his info 12/22: CCM LCSW discussed with patient the importance of contacting Law Enforcement if he has concerns for safety/fraud Validation and encouragement was provided during call. CCM LCSW encouraged patient to continue reflecting on past experiences and current friendships to promote positive mood and stress management. States he has enjoyed playing cards online with friends and praying to cope with stressors. 10/10 Patient shared that recent stress has been alleviated with drawing and talking with family and friends  CCM LCSW provided a supportive environment to allow patient to process his emotions Patient reports compliance with medications. Patient utilizes medicine cabinet with labels, AM and PM, to assist with medication management Patient reports that his strong spiritual relationship keeps him encouraged to move forward instead of focusing on past events 11/1: Patient enjoys walking to assist with stress management Patient reports becoming stir crazy from staying at home for majority of the day. CCM LCSW discussed benefits of participating in [facility]. Patient reports hx of attending [facility]. States that he disenrolled because staff and participants believed he was delusional because he has close relationships with various celebrities. Patient is not interested in initiating psychiatry and/or counseling to assist with strengthening support  system 09/08: Patient reports that he participates in a group weekly to promote socialization with others 12/22: States he feels "liberated" without caregivers because he no longer has things stolen from him CCM LCSW discussed strategies to improve communication skills with aid to assist in preventing misunderstandings or offending others  CCM LCSW reviewed upcoming appointments with patient. He reports that he is utilizing [employer] for transportation. His aid has offered to bring patient to local stores to obtain cleaning supplies. Patient reports that he is aware that he can also utilize [employer] for transportation to medical appointments 10/10: Patient has a ride scheduled for 10/18 to take him grocery shopping CCM LCSW discussed strategies to assist patient with memory concerns. Patient agreed to write notes and post them in high traffic areas of the home to remind him of upcoming appts and/or medications CCM LCSW collaborated with PCP and CCM RN regarding concerns of medication adherence. LCSW informed RNCM of patient's request for a f/up call regarding questions about his cholesterol levels and strategies to manage condition Other interventions: Solution-Focused Strategies, Mindfulness or Relaxation Training, Active listening / Reflection utilized , Emotional Supportive Provided, Psychoeducation for mental health needs , Motivational Interviewing, Participation in counseling encouraged , Participation in support group encouraged , Consideration of in-home help encouraged , and Verbalization of feelings encouraged   Discussed plans with patient for ongoing care management follow up and provided patient with direct contact information for care management team Collaboration with PCP regarding development and update of comprehensive plan of care as evidenced by provider attestation and co-signature Inter-disciplinary care team collaboration (see longitudinal plan of care) Patient Goals/Self-Care  Activities: Over the next 120 days Attend all scheduled appointments with providers Contact office with any questions or concerns Utilize [facility] transportation number to assist with attending medical appointments Practice relaxation or meditation daily Practice positive thinking and self-talk       [name], MSW, [email or web address] Phone [phone] 9:58 AM

## 2021-08-01 ENCOUNTER — Telehealth: Payer: Self-pay | Admitting: Primary Care

## 2021-08-01 ENCOUNTER — Other Ambulatory Visit: Payer: Self-pay | Admitting: Nurse Practitioner

## 2021-08-01 NOTE — Telephone Encounter (Signed)
Spoke with patient and have scheduled an In-home Palliative Consult for 08/11/21 @ 9 AM

## 2021-08-02 NOTE — Telephone Encounter (Signed)
Requested Prescriptions  Pending Prescriptions Disp Refills   XARELTO 20 MG TABS tablet [Pharmacy Med Name: XARELTO 20 MG TAB] 90 tablet 4    Sig: TAKE 1 TABLET BY MOUTH AT BEDTIME     Hematology: Anticoagulants - rivaroxaban Passed - 08/01/2021 12:56 PM      Passed - ALT in normal range and within 180 days    ALT  Date Value Ref Range Status  07/09/2021 18 0 - 44 U/L Final   SGPT (ALT)  Date Value Ref Range Status  11/29/2014 40 U/L Final    Comment:    17-63 NOTE: New Reference Range  10/13/14          Passed - AST in normal range and within 180 days    AST  Date Value Ref Range Status  07/09/2021 19 15 - 41 U/L Final   SGOT(AST)  Date Value Ref Range Status  11/29/2014 36 U/L Final    Comment:    15-41 NOTE: New Reference Range  10/13/14          Passed - Cr in normal range and within 360 days    Creatinine  Date Value Ref Range Status  11/29/2014 1.20 mg/dL Final    Comment:    0.61-1.24 NOTE: New Reference Range  10/13/14    Creatinine, Ser  Date Value Ref Range Status  07/09/2021 0.78 0.61 - 1.24 mg/dL Final         Passed - HCT in normal range and within 360 days    HCT  Date Value Ref Range Status  07/09/2021 43.9 39.0 - 52.0 % Final   Hematocrit  Date Value Ref Range Status  12/03/2020 44.7 37.5 - 51.0 % Final         Passed - HGB in normal range and within 360 days    Hemoglobin  Date Value Ref Range Status  07/09/2021 14.3 13.0 - 17.0 g/dL Final  12/03/2020 14.7 13.0 - 17.7 g/dL Final         Passed - PLT in normal range and within 360 days    Platelets  Date Value Ref Range Status  07/09/2021 213 150 - 400 K/uL Final  12/03/2020 253 150 - 450 x10E3/uL Final         Passed - Valid encounter within last 12 months    Recent Outpatient Visits          2 months ago Type 2 diabetes mellitus with morbid obesity ([place])   [place], [name] T, NP   5 months ago Type 2 diabetes mellitus with morbid obesity ([place])    [place], [name] T, NP   6 months ago Type 2 diabetes mellitus with morbid obesity ([place])   [place], [name] T, NP   8 months ago Hyperlipidemia associated with type 2 diabetes mellitus ([place])   [place] [name], [name] T, NP   10 months ago Hypertension associated with diabetes ([place])   [place], [name], NP      Future Appointments            In 1 week [name], [name], NP [employer]   In 6 months  [employer]

## 2021-08-04 NOTE — Telephone Encounter (Signed)
Patient was notified and made aware of [name]'s recommendations.

## 2021-08-11 ENCOUNTER — Other Ambulatory Visit: Payer: Self-pay | Admitting: Nurse Practitioner

## 2021-08-11 ENCOUNTER — Other Ambulatory Visit: Payer: Self-pay

## 2021-08-11 ENCOUNTER — Other Ambulatory Visit: Payer: Medicare Other | Admitting: Primary Care

## 2021-08-11 VITALS — Ht 65.0 in | Wt 325.0 lb

## 2021-08-11 DIAGNOSIS — Z8673 Personal history of transient ischemic attack (TIA), and cerebral infarction without residual deficits: Secondary | ICD-10-CM

## 2021-08-11 DIAGNOSIS — E1169 Type 2 diabetes mellitus with other specified complication: Secondary | ICD-10-CM | POA: Diagnosis not present

## 2021-08-11 DIAGNOSIS — Z515 Encounter for palliative care: Secondary | ICD-10-CM

## 2021-08-11 DIAGNOSIS — F423 Hoarding disorder: Secondary | ICD-10-CM
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
## 2021-08-12 ENCOUNTER — Ambulatory Visit (INDEPENDENT_AMBULATORY_CARE_PROVIDER_SITE_OTHER): Payer: Medicare Other | Admitting: Nurse Practitioner

## 2021-08-12 ENCOUNTER — Encounter: Payer: Self-pay | Admitting: Nurse Practitioner

## 2021-08-12 ENCOUNTER — Other Ambulatory Visit: Payer: Self-pay

## 2021-08-12 DIAGNOSIS — F200 Paranoid schizophrenia: Secondary | ICD-10-CM

## 2021-08-12 DIAGNOSIS — I25118 Atherosclerotic heart disease of native coronary artery with other forms of angina pectoris: Secondary | ICD-10-CM

## 2021-08-12 DIAGNOSIS — J449 Chronic obstructive pulmonary disease, unspecified: Secondary | ICD-10-CM

## 2021-08-12 DIAGNOSIS — D6869 Other thrombophilia: Secondary | ICD-10-CM

## 2021-08-12 DIAGNOSIS — I700 Atherosclerosis of aorta: Secondary | ICD-10-CM | POA: Diagnosis not present

## 2021-08-12 DIAGNOSIS — M810 Age-related osteoporosis without current pathological fracture: Secondary | ICD-10-CM

## 2021-08-12 DIAGNOSIS — I152 Hypertension secondary to endocrine disorders: Secondary | ICD-10-CM | POA: Diagnosis not present

## 2021-08-12 DIAGNOSIS — E559 Vitamin D deficiency, unspecified: Secondary | ICD-10-CM | POA: Diagnosis not present

## 2021-08-12 DIAGNOSIS — E1169 Type 2 diabetes mellitus with other specified complication: Secondary | ICD-10-CM

## 2021-08-12 DIAGNOSIS — Z23 Encounter for immunization: Secondary | ICD-10-CM

## 2021-08-12 DIAGNOSIS — Z8673 Personal history of transient ischemic attack (TIA), and cerebral infarction without residual deficits: Secondary | ICD-10-CM

## 2021-08-12 DIAGNOSIS — R413 Other amnesia: Secondary | ICD-10-CM

## 2021-08-12 DIAGNOSIS — E785 Hyperlipidemia, unspecified: Secondary | ICD-10-CM | POA: Diagnosis not present

## 2021-08-12 DIAGNOSIS — E1159 Type 2 diabetes mellitus with other circulatory complications: Secondary | ICD-10-CM

## 2021-08-12 DIAGNOSIS — G4733 Obstructive sleep apnea (adult) (pediatric): Secondary | ICD-10-CM | POA: Diagnosis not present

## 2021-08-12 DIAGNOSIS — I5032 Chronic diastolic (congestive) heart failure: Secondary | ICD-10-CM

## 2021-08-12 DIAGNOSIS — Z9989 Dependence on other enabling machines and devices: Secondary | ICD-10-CM

## 2021-08-12 DIAGNOSIS — I4819 Other persistent atrial fibrillation: Secondary | ICD-10-CM | POA: Diagnosis not present

## 2021-08-12 DIAGNOSIS — E538 Deficiency of other specified B group vitamins: Secondary | ICD-10-CM

## 2021-08-12 DIAGNOSIS — K224 Dyskinesia of esophagus: Secondary | ICD-10-CM

## 2021-08-12 LAB — MICROALBUMIN, URINE WAIVED
Creatinine, Urine Waived: 50 mg/dL (ref 10–300)
Microalb, Ur Waived: 30 mg/L — ABNORMAL HIGH (ref 0–19)

## 2021-08-12 LAB — BAYER DCA HB A1C WAIVED: HB A1C (BAYER DCA - WAIVED): 5.9 % — ABNORMAL HIGH (ref 4.8–5.6)

## 2021-08-12 MED ORDER — VITAMIN B-12 1000 MCG PO TABS: 1000.0000 ug | ORAL_TABLET | Freq: Every day | ORAL | 4 refills | Status: DC

## 2021-08-12 MED ORDER — RIVAROXABAN 20 MG PO TABS: 20.0000 mg | ORAL_TABLET | Freq: Every day | ORAL | 4 refills | Status: DC

## 2021-08-12 MED ORDER — ATORVASTATIN CALCIUM 80 MG PO TABS: 80.0000 mg | ORAL_TABLET | Freq: Every day | ORAL | 4 refills | Status: DC

## 2021-08-12 MED ORDER — OLMESARTAN MEDOXOMIL 5 MG PO TABS: 10.0000 mg | ORAL_TABLET | Freq: Every day | ORAL | 4 refills | Status: DC

## 2021-08-12 NOTE — Assessment & Plan Note (Signed)
Chronic, ongoing.  BP initially above goal and recheck at goal -- irritated initially.  Continue Olmesartan 10 MG daily, is tolerating, and continue Metoprolol + Lasix.  Recommend checking BP at home a few mornings a week at home and documenting + focus on DASH diet.  Continue collaboration with cardiology.  Return in 3 months.  CMP, CBC, TSH today.

## 2021-08-12 NOTE — Assessment & Plan Note (Signed)
BMI 35.05 with T2DM, HTN.  Recommended eating smaller high protein, low fat meals more frequently and exercising 30 mins a day 5 times a week with a goal of 10-15lb weight loss in the next 3 months. Patient voiced their understanding and motivation to adhere to these recommendations.

## 2021-08-12 NOTE — Assessment & Plan Note (Signed)
Acute 9 mm right thalamic infarct on 11/06/2020.  At this time continue current medication regimen and will adjust as needed.  Discussed with him stroke prevention goals.  Referral to neurology for further assessment placed, did not attend in past due to transportation. He declines PT/OT in home.

## 2021-08-12 NOTE — Patient Instructions (Signed)

## 2021-08-12 NOTE — Assessment & Plan Note (Signed)
Chronic, ongoing.  Recommend continue 100% use of CPAP.

## 2021-08-12 NOTE — Assessment & Plan Note (Signed)
Chronic, ongoing with A1C 6.2% last visit % and urine ALB 30 and A:C 30-300 February 2022 -- will recheck both today.  Continue ARB for kidney protection.  Avoid ACE due to COPD.  Continue current Jardiance, benefit with his HF.  A1c and CMP + urine ALB today.  Recommend he check BS at least 3 mornings a week at home.  Return to office in 3 months.  Continue pill packs for consistent medication adherence + palliative visits.

## 2021-08-12 NOTE — Assessment & Plan Note (Signed)
Chronic, ongoing.  Continue collaboration with cardiology and current medication regimen, including Metoprolol and Xarelto.  Labs today.

## 2021-08-12 NOTE — Assessment & Plan Note (Addendum)
Noted on CT 10/29/17.  Continue Atorvastatin and Xarelto which offer preventative.  Recommend he consistently take his medication daily, currently using pill packs.

## 2021-08-12 NOTE — Assessment & Plan Note (Signed)
Ongoing, continue daily calcium and Vit D, would be cautious with bisphosphonates due to his uncontrolled GERD.  May benefit from injectable vs oral treatment, discuss further at upcoming appointments. Vit D level today and plan for repeat DEXA this year.

## 2021-08-12 NOTE — Assessment & Plan Note (Signed)
Since CVA, with underlying mental health disorder.  Referral to neurology placed after discussion with patient. 6CIT = 14

## 2021-08-12 NOTE — Assessment & Plan Note (Signed)
Chronic, ongoing.  Continue current medication regimen and adjust as needed.  Return in 3 months for visit.  Lipid panel today.

## 2021-08-12 NOTE — Assessment & Plan Note (Signed)
Chronic, ongoing. Euvolemic today.  Continue collaboration with cardiology and HF clinic. Recommend he take medication consistently. Recommend: - Reminded to call for an overnight weight gain of >2 pounds or a weekly weight weight of >5 pounds - not adding salt to his food and has been reading food labels. Reviewed the importance of keeping daily sodium intake to 2000mg  daily  - Avoid Ibuprofen products. - Return in 3 months -- labs obtained today.

## 2021-08-12 NOTE — Assessment & Plan Note (Signed)
Chronic, ongoing with ongoing grandiose thought processes present, ?more schizophrenia on presentation -- high suspicion for this.  Continue current medication regimen and adjust as needed -- may benefit change to Olanzapine in future.  Has history on review old records of missing doses Risperdal, will continue current medication at this time and adjust as needed -- pill packs have been beneficial for patient -- continue palliative collaboration.  Continue collaboration with CCM SW.  Would benefit from psychiatry, he is agreeable to this today if someone PCP trusts, referral placed.

## 2021-08-12 NOTE — Assessment & Plan Note (Signed)
On Xarelto for A-Fib, continue this regimen and check CBC every 6 months.

## 2021-08-12 NOTE — Assessment & Plan Note (Signed)
Noted recent labs and supplement sent in for pill packs, continue this and recheck level today.

## 2021-08-12 NOTE — Assessment & Plan Note (Signed)
Chronic, ongoing.  Continue supplement and recheck level today. 

## 2021-08-12 NOTE — Assessment & Plan Note (Signed)
Chronic, stable with no angina symptoms.  Continue current medication regimen and collaboration with cardiology.

## 2021-08-12 NOTE — Assessment & Plan Note (Signed)
Chronic, stable.  Continue current inhaler regimen and adjust as needed, recommend he use inhaler daily vs as needed.  Unable to attain spirometry, he is unable to follow instructions for use.  Return in 3 months.

## 2021-08-12 NOTE — Progress Notes (Signed)
BP 135/87 (BP Location: Left Arm, Cuff Size: Normal)    Pulse 97    Temp 97.9 F (36.6 C) (Oral)    Ht 5\' 5"  (1.651 m)    Wt 210 lb 9.6 oz (95.5 kg)    SpO2 97%    BMI 35.05 kg/m    Subjective:    Patient ID: Evan [name], male    DOB: [birth year], 66 y.o.   MRN: [id number]  HPI: Evan [name] is a 66 y.o. male  Chief Complaint  Patient presents with   Diabetes   Hyperlipidemia   Hypertension   Congestive Heart Failure   COPD   Had initial palliative visit yesterday (08/11/21), to assist with chronic care management.  He reports the palliative NP is his "angel".  History of prostate cancer, continues to follow with oncology and last seen in July 2022.  DIABETES Continues on Jardiance 10 MG daily.  A1c 6.2% in October. B12 in October 2022 = 418 and Vitamin D 23.4 on past visit -- supplements were sent for him to take and place in pill packs.  Palliative did report pill packs are in home. Hypoglycemic episodes:no Polydipsia/polyuria: no Visual disturbance: no Chest pain: no Paresthesias: no Glucose Monitoring: no             Accucheck frequency: occasionally             Fasting glucose: 120 when checks he reports             Post prandial:             Evening:             Before meals: Taking Insulin?: no             Long acting insulin:             Short acting insulin: Blood Pressure Monitoring: not checking Retinal Examination: Up to Date Foot Exam: Up to Date Pneumovax: provided today Influenza: Up To Date Aspirin: yes    HYPERTENSION / HYPERLIPIDEMIA/CHF Saw [facility] on 06/28/21.  Recent EF 11/06/20 was 50 to 55%.  Saw [facility] on 03/31/2020, no changes made.  Uses CPAP at home about 100% of the time per his report.  No recent NTG use.  History of 6 vessel CABG in 2009.  Has history of CVA 11/06/20 -- has not followed up with neurology as recommended, transportation is an issue, but CCM team works with him.  Currently taking ASA, Atorvastatin, Zetia, Lasix,  Metoprolol, Olmesartan. Satisfied with current treatment? yes Duration of hypertension: chronic BP monitoring frequency: not checking BP range:  BP medication side effects: no Duration of hyperlipidemia: chronic Cholesterol medication side effects: no Cholesterol supplements: none Medication compliance: good compliance Aspirin: yes Recent stressors: no Recurrent headaches: no Visual changes: no Palpitations: no Dyspnea: no Chest pain: no -- denies any recent Lower extremity edema: no Dizzy/lightheaded: no  6CIT Screen 08/12/2021  What Year? 0 points  What month? 0 points  What time? 0 points  Count back from 20 4 points  Months in reverse 0 points  Repeat phrase 10 points  Total Score 14   ATRIAL FIBRILLATION Atrial fibrillation status: stable Satisfied with current treatment: yes  Medication side effects:  no Medication compliance: fair compliance Etiology of atrial fibrillation:  Palpitations:  no Chest pain:  no Dyspnea on exertion:  no Orthopnea:  no Syncope:  no Edema: baseline Ventricular rate control: B-blocker Anti-coagulation: long acting   COPD Has  never been a smoker -- Symbicort on board but he reports he does not consistently use this and does not use Albuterol. COPD status: stable Satisfied with current treatment?: yes Oxygen use: no Dyspnea frequency: occasional at baseline Cough frequency: occasional at baseline Rescue inhaler frequency:  rarely Limitation of activity: no Productive cough: none Last Spirometry: unable to follow directions to perform Pneumovax: given today Influenza: Up to Date  GERD Takes Protonix 40 MG daily. GERD control status: stable Satisfied with current treatment? yes Heartburn frequency: occasional Medication side effects: no  Medication compliance: stable Previous GERD medications: TUMS Antacid use frequency: occasional Dysphagia: no Odynophagia:  no Hematemesis: no Blood in stool: no EGD: yes     ANXIETY/STRESS Continues on Risperdal BID for mood, states he takes this daily (has pill packs).  Mood continues to be concern, he calls office frequently with anxiety about his home place and people "stealing" things from him.  CCM team is involved.  History: When he attended [facility] there was concern he did not take medication consistently and about his delusional behaviors, he does not want to return to [facility] as he did not like they did not "believe I knew these people and called me delusional".  Reports this hurt his feelings.  [employer] offered him coverage a few years back and then he left [facility].   He continues to refuse any referral to psychiatry or return to [facility] and continues to discuss his friendships with multiple different famous people -- [name] and Ivin Booty. Duration:stable Anxious mood: no  Excessive worrying: no Irritability: no  Sweating: no Nausea: no Palpitations:no Hyperventilation: no Panic attacks: no Agoraphobia: no  Obscessions/compulsions: yes Depressed mood: no Depression screen [facility] 2/9 08/12/2021 05/11/2021 02/25/2021 02/08/2021 12/03/2020  Decreased Interest 0 - 0 0 0  Down, Depressed, Hopeless 0 0 0 1 0  PHQ - 2 Score 0 0 0 1 0  Altered sleeping 1 1 - 0 0  Tired, decreased energy 1 0 - 1 0  Change in appetite - 0 - 0 0  Feeling bad or failure about yourself  0 0 - 0 0  Trouble concentrating 0 0 - 0 0  Moving slowly or fidgety/restless 0 0 - 0 0  Suicidal thoughts 0 0 - 0 0  PHQ-9 Score 2 1 - 2 0  Difficult doing work/chores Not difficult at all Not difficult at all - Not difficult at all Not difficult at all  Some recent data might be hidden   GAD 7 : Generalized Anxiety Score 08/12/2021 05/11/2021 02/08/2021 04/08/2020  Nervous, Anxious, on Edge 1 1 1  0  Control/stop worrying 2 1 0 1  Worry too much - different things 1 1 0 1  Trouble relaxing 0 0 0 0  Restless 0 0 0 0  Easily annoyed or irritable 2 0 1 0  Afraid - awful might happen 1 1 0 0  Total GAD 7  Score 7 4 2 2   Anxiety Difficulty Somewhat difficult Not difficult at all Not difficult at all Not difficult at all   Relevant past medical, surgical, family and social history reviewed and updated as indicated. Interim medical history since our last visit reviewed. Allergies and medications reviewed and updated.  Review of Systems  Constitutional:  Negative for activity change, diaphoresis, fatigue and fever.  Respiratory:  Negative for cough, chest tightness, shortness of breath and wheezing.   Cardiovascular:  Positive for leg swelling (baseline per his report). Negative for chest pain and palpitations.  Gastrointestinal: Negative.   Endocrine: Negative for cold intolerance, heat intolerance, polydipsia, polyphagia and polyuria.  Neurological: Negative.   Psychiatric/Behavioral:  Negative for decreased concentration, self-injury, sleep disturbance and suicidal ideas. The patient is not nervous/anxious.    Per HPI unless specifically indicated above     Objective:    BP 135/87 (BP Location: Left Arm, Cuff Size: Normal)    Pulse 97    Temp 97.9 F (36.6 C) (Oral)    Ht 5\' 5"  (1.651 m)    Wt 210 lb 9.6 oz (95.5 kg)    SpO2 97%    BMI 35.05 kg/m   Wt Readings from Last 3 Encounters:  08/12/21 210 lb 9.6 oz (95.5 kg)  08/11/21 (!) 325 lb (147.4 kg)  07/09/21 (!) 325 lb (147.4 kg)    Physical Exam Vitals and nursing note reviewed.  Constitutional:      General: He is awake. He is not in acute distress.    Appearance: He is well-developed. He is morbidly obese. He is not ill-appearing.  HENT:     Head: Normocephalic and atraumatic.     Right Ear: Hearing normal. No drainage.     Left Ear: Hearing normal. No drainage.  Eyes:     General: Lids are normal.        Right eye: No discharge.        Left eye: No discharge.     Pupils: Pupils are equal, round, and reactive to light.  Neck:     Thyroid: No thyromegaly.     Vascular: No carotid bruit or JVD.     Trachea: Trachea normal.   Cardiovascular:     Rate and Rhythm: Normal rate and regular rhythm.     Pulses:          Dorsalis pedis pulses are 1+ on the right side and 1+ on the left side.       Posterior tibial pulses are 1+ on the right side and 1+ on the left side.     Heart sounds: Normal heart sounds, S1 normal and S2 normal. No murmur heard.   No gallop.     Comments: Varicose veins bilateral legs with hemosiderin staining bilaterally R>L.  No open wounds noted. Pulmonary:     Effort: Pulmonary effort is normal. No accessory muscle usage or respiratory distress.     Breath sounds: Normal breath sounds.  Abdominal:     General: Bowel sounds are normal.     Palpations: Abdomen is soft.     Tenderness: There is no abdominal tenderness.  Musculoskeletal:        General: Normal range of motion.     Cervical back: Normal range of motion and neck supple.     Right lower leg: No edema.     Left lower leg: No edema.  Lymphadenopathy:     Cervical: No cervical adenopathy.  Skin:    General: Skin is warm and dry.     Capillary Refill: Capillary refill takes less than 2 seconds.  Neurological:     Mental Status: He is alert and oriented to person, place, and time.     Gait: Gait is intact.  Psychiatric:        Attention and Perception: Attention normal.        Mood and Affect: Mood normal.        Speech: Speech normal.        Behavior: Behavior normal. Behavior is cooperative.     Comments: Tangential thought  processes per baseline.  Talked about his friendship with [name] and Ivin Booty.  This is baseline for him.  Does not stay on topic, distracts easily.   Diabetic Foot Exam - Simple   Simple Foot Form Visual Inspection See comments: Yes Sensation Testing See comments: Yes Pulse Check See comments: Yes Comments 1+ pulses DP and PT with varicose veins and hemosiderin staining.  Sensation right 7/10 and left 8/10.  Onychomycosis and dry skin.     Results for orders placed or performed  during the hospital encounter of 07/09/21  CBC with Differential  Result Value Ref Range   WBC 9.0 4.0 - 10.5 K/uL   RBC 4.87 4.22 - 5.81 MIL/uL   Hemoglobin 14.3 13.0 - 17.0 g/dL   HCT 43.9 39.0 - 52.0 %   MCV 90.1 80.0 - 100.0 fL   MCH 29.4 26.0 - 34.0 pg   MCHC 32.6 30.0 - 36.0 g/dL   RDW 14.6 11.5 - 15.5 %   Platelets 213 150 - 400 K/uL   nRBC 0.0 0.0 - 0.2 %   Neutrophils Relative % 74 %   Neutro Abs 6.6 1.7 - 7.7 K/uL   Lymphocytes Relative 17 %   Lymphs Abs 1.5 0.7 - 4.0 K/uL   Monocytes Relative 7 %   Monocytes Absolute 0.6 0.1 - 1.0 K/uL   Eosinophils Relative 2 %   Eosinophils Absolute 0.2 0.0 - 0.5 K/uL   Basophils Relative 0 %   Basophils Absolute 0.0 0.0 - 0.1 K/uL   Immature Granulocytes 0 %   Abs Immature Granulocytes 0.03 0.00 - 0.07 K/uL  Comprehensive metabolic panel  Result Value Ref Range   Sodium 137 135 - 145 mmol/L   Potassium 3.4 (L) 3.5 - 5.1 mmol/L   Chloride 104 98 - 111 mmol/L   CO2 24 22 - 32 mmol/L   Glucose, Bld 123 (H) 70 - 99 mg/dL   BUN 14 8 - 23 mg/dL   Creatinine, Ser 0.78 0.61 - 1.24 mg/dL   Calcium 8.7 (L) 8.9 - 10.3 mg/dL   Total Protein 7.0 6.5 - 8.1 g/dL   Albumin 3.9 3.5 - 5.0 g/dL   AST 19 15 - 41 U/L   ALT 18 0 - 44 U/L   Alkaline Phosphatase 82 38 - 126 U/L   Total Bilirubin 0.7 0.3 - 1.2 mg/dL   GFR, Estimated >60 >60 mL/min   Anion gap 9 5 - 15  Magnesium  Result Value Ref Range   Magnesium 2.2 1.7 - 2.4 mg/dL  CK  Result Value Ref Range   Total CK 106 49 - 397 U/L  Troponin I (High Sensitivity)  Result Value Ref Range   Troponin I (High Sensitivity) 13 <18 ng/L      Assessment & Plan:   Problem List Items Addressed This Visit       Cardiovascular and Mediastinum   Atherosclerosis of aorta ([place])    Noted on CT 10/29/17.  Continue Atorvastatin and Xarelto which offer preventative.  Recommend he consistently take his medication daily, currently using pill packs.      Relevant Medications   olmesartan  (BENICAR) 5 MG tablet   atorvastatin (LIPITOR) 80 MG tablet   rivaroxaban (XARELTO) 20 MG TABS tablet   CAD (coronary artery disease)    Chronic, stable with no angina symptoms.  Continue current medication regimen and collaboration with cardiology.      Relevant Medications   olmesartan (BENICAR) 5 MG tablet   atorvastatin (  LIPITOR) 80 MG tablet   rivaroxaban (XARELTO) 20 MG TABS tablet   Chronic diastolic CHF (congestive heart failure) (HCC)    Chronic, ongoing. Euvolemic today.  Continue collaboration with cardiology and HF clinic. Recommend he take medication consistently. Recommend: - Reminded to call for an overnight weight gain of >2 pounds or a weekly weight weight of >5 pounds - not adding salt to his food and has been reading food labels. Reviewed the importance of keeping daily sodium intake to 2000mg  daily  - Avoid Ibuprofen products. - Return in 3 months -- labs obtained today.      Relevant Medications   olmesartan (BENICAR) 5 MG tablet   atorvastatin (LIPITOR) 80 MG tablet   rivaroxaban (XARELTO) 20 MG TABS tablet   Other Relevant Orders   Comprehensive metabolic panel   Hypertension associated with diabetes (HCC)    Chronic, ongoing.  BP initially above goal and recheck at goal -- irritated initially.  Continue Olmesartan 10 MG daily, is tolerating, and continue Metoprolol + Lasix.  Recommend checking BP at home a few mornings a week at home and documenting + focus on DASH diet.  Continue collaboration with cardiology.  Return in 3 months.  CMP, CBC, TSH today.      Relevant Medications   olmesartan (BENICAR) 5 MG tablet   atorvastatin (LIPITOR) 80 MG tablet   rivaroxaban (XARELTO) 20 MG TABS tablet   Other Relevant Orders   Bayer DCA Hb A1c Waived   Microalbumin, Urine Waived   CBC with Differential/Platelet   Comprehensive metabolic panel   TSH   Persistent atrial fibrillation (HCC)    Chronic, ongoing.  Continue collaboration with cardiology and current  medication regimen, including Metoprolol and Xarelto.  Labs today.      Relevant Medications   olmesartan (BENICAR) 5 MG tablet   atorvastatin (LIPITOR) 80 MG tablet   rivaroxaban (XARELTO) 20 MG TABS tablet   Other Relevant Orders   Ambulatory referral to Neurology     Respiratory   COPD, mild (HCC)    Chronic, stable.  Continue current inhaler regimen and adjust as needed, recommend he use inhaler daily vs as needed.  Unable to attain spirometry, he is unable to follow instructions for use.  Return in 3 months.      Relevant Orders   CBC with Differential/Platelet   Obstructive sleep apnea on CPAP    Chronic, ongoing.  Recommend continue 100% use of CPAP.        Digestive   Esophageal dysmotility   Relevant Orders   Magnesium     Endocrine   Hyperlipidemia associated with type 2 diabetes mellitus (HCC)    Chronic, ongoing.  Continue current medication regimen and adjust as needed.  Return in 3 months for visit.  Lipid panel today.      Relevant Medications   olmesartan (BENICAR) 5 MG tablet   atorvastatin (LIPITOR) 80 MG tablet   rivaroxaban (XARELTO) 20 MG TABS tablet   Other Relevant Orders   Bayer DCA Hb A1c Waived   Comprehensive metabolic panel   Lipid Panel w/o Chol/HDL Ratio   Type 2 diabetes mellitus with morbid obesity ([place]) - Primary    Chronic, ongoing with A1C 6.2% last visit % and urine ALB 30 and A:C 30-300 February 2022 -- will recheck both today.  Continue ARB for kidney protection.  Avoid ACE due to COPD.  Continue current Jardiance, benefit with his HF.  A1c and CMP + urine ALB today.  Recommend he check  BS at least 3 mornings a week at home.  Return to office in 3 months.  Continue pill packs for consistent medication adherence + palliative visits.      Relevant Medications   olmesartan (BENICAR) 5 MG tablet   atorvastatin (LIPITOR) 80 MG tablet   Other Relevant Orders   Bayer DCA Hb A1c Waived   Microalbumin, Urine Waived     Musculoskeletal  and Integument   Osteoporosis    Ongoing, continue daily calcium and Vit D, would be cautious with bisphosphonates due to his uncontrolled GERD.  May benefit from injectable vs oral treatment, discuss further at upcoming appointments. Vit D level today and plan for repeat DEXA this year.        Hematopoietic and Hemostatic   Other thrombophilia ([place])    On Xarelto for A-Fib, continue this regimen and check CBC every 6 months.        Other   History of CVA (cerebrovascular accident)    Acute 9 mm right thalamic infarct on 11/06/2020.  At this time continue current medication regimen and will adjust as needed.  Discussed with him stroke prevention goals.  Referral to neurology for further assessment placed, did not attend in past due to transportation. He declines PT/OT in home.      Relevant Orders   Ambulatory referral to Neurology   Memory changes    Since CVA, with underlying mental health disorder.  Referral to neurology placed after discussion with patient. 6CIT = 14      Obesity, morbid (more than 100 lbs over ideal weight or BMI > 40) (HCC)    BMI 35.05 with T2DM, HTN.  Recommended eating smaller high protein, low fat meals more frequently and exercising 30 mins a day 5 times a week with a goal of 10-15lb weight loss in the next 3 months. Patient voiced their understanding and motivation to adhere to these recommendations.       Paranoid type schizophrenia ([place])    Chronic, ongoing with ongoing grandiose thought processes present, ?more schizophrenia on presentation -- high suspicion for this.  Continue current medication regimen and adjust as needed -- may benefit change to Olanzapine in future.  Has history on review old records of missing doses Risperdal, will continue current medication at this time and adjust as needed -- pill packs have been beneficial for patient -- continue palliative collaboration.  Continue collaboration with CCM SW.  Would benefit from psychiatry, he is  agreeable to this today if someone PCP trusts, referral placed.      Relevant Orders   Ammonia   Ambulatory referral to Psychiatry   Vitamin B12 deficiency    Noted recent labs and supplement sent in for pill packs, continue this and recheck level today.      Relevant Orders   Vitamin B12   Vitamin D deficiency    Chronic, ongoing.  Continue supplement and recheck level today.      Relevant Orders   VITAMIN D 25 Hydroxy (Vit-D Deficiency, Fractures)   Other Visit Diagnoses     Need for Td vaccine       Td in office today, discussed with patient.   Relevant Orders   Td vaccine greater than or equal to 7yo preservative free IM (Completed)   Pneumococcal vaccination given       PCV13 in office today, discussed with patient   Relevant Orders   Pneumococcal conjugate vaccine 13-valent (Completed)       Time: 30 minutes, >50%  spent counseling/or care coordination  Follow up plan: Return in about 3 months (around 11/10/2021) for T2DM, HTN/HLD, GERD, MOOD.

## 2021-08-13 ENCOUNTER — Other Ambulatory Visit: Payer: Self-pay | Admitting: Nurse Practitioner

## 2021-08-13 LAB — COMPREHENSIVE METABOLIC PANEL
ALT: 28 IU/L (ref 0–44)
AST: 16 IU/L (ref 0–40)
Albumin/Globulin Ratio: 2 (ref 1.2–2.2)
Albumin: 4.4 g/dL (ref 3.8–4.8)
Alkaline Phosphatase: 109 IU/L (ref 44–121)
BUN/Creatinine Ratio: 20 (ref 10–24)
BUN: 18 mg/dL (ref 8–27)
Bilirubin Total: 0.4 mg/dL (ref 0.0–1.2)
CO2: 23 mmol/L (ref 20–29)
Calcium: 9.9 mg/dL (ref 8.6–10.2)
Chloride: 101 mmol/L (ref 96–106)
Creatinine, Ser: 0.92 mg/dL (ref 0.76–1.27)
Globulin, Total: 2.2 g/dL (ref 1.5–4.5)
Glucose: 133 mg/dL — ABNORMAL HIGH (ref 70–99)
Potassium: 4.3 mmol/L (ref 3.5–5.2)
Sodium: 137 mmol/L (ref 134–144)
Total Protein: 6.6 g/dL (ref 6.0–8.5)
eGFR: 92 mL/min/{1.73_m2} (ref >59)

## 2021-08-13 LAB — CBC WITH DIFFERENTIAL/PLATELET
Basophils Absolute: 0 10*3/uL (ref 0.0–0.2)
Basos: 0 %
EOS (ABSOLUTE): 0.1 10*3/uL (ref 0.0–0.4)
Eos: 2 %
Hematocrit: 45.6 % (ref 37.5–51.0)
Hemoglobin: 14.6 g/dL (ref 13.0–17.7)
Immature Grans (Abs): 0.1 10*3/uL (ref 0.0–0.1)
Immature Granulocytes: 1 %
Lymphocytes Absolute: 1.3 10*3/uL (ref 0.7–3.1)
Lymphs: 14 %
MCH: 28.7 pg (ref 26.6–33.0)
MCHC: 32 g/dL (ref 31.5–35.7)
MCV: 90 fL (ref 79–97)
Monocytes Absolute: 0.7 10*3/uL (ref 0.1–0.9)
Monocytes: 7 %
Neutrophils Absolute: 7.1 10*3/uL — ABNORMAL HIGH (ref 1.4–7.0)
Neutrophils: 76 %
Platelets: 249 10*3/uL (ref 150–450)
RBC: 5.08 x10E6/uL (ref 4.14–5.80)
RDW: 14.5 % (ref 11.6–15.4)
WBC: 9.3 10*3/uL (ref 3.4–10.8)

## 2021-08-13 LAB — VITAMIN B12: Vitamin B-12: 372 pg/mL (ref 232–1245)

## 2021-08-13 LAB — LIPID PANEL W/O CHOL/HDL RATIO
Cholesterol, Total: 196 mg/dL (ref 100–199)
HDL: 54 mg/dL (ref >39)
LDL Chol Calc (NIH): 118 mg/dL — ABNORMAL HIGH (ref 0–99)
Triglycerides: 137 mg/dL (ref 0–149)
VLDL Cholesterol Cal: 24 mg/dL (ref 5–40)

## 2021-08-13 LAB — TSH: TSH: 1.59 u[IU]/mL (ref 0.450–4.500)

## 2021-08-13 LAB — MAGNESIUM: Magnesium: 2.1 mg/dL (ref 1.6–2.3)

## 2021-08-13 LAB — VITAMIN D 25 HYDROXY (VIT D DEFICIENCY, FRACTURES): Vit D, 25-Hydroxy: 22.8 ng/mL — ABNORMAL LOW (ref 30.0–100.0)

## 2021-08-13 LAB — AMMONIA: Ammonia: 67 ug/dL (ref 40–200)

## 2021-08-13 MED ORDER — CHOLECALCIFEROL 125 MCG (5000 UT) PO TABS: 5000.0000 [IU] | ORAL_TABLET | Freq: Every day | ORAL | 4 refills | Status: DC

## 2021-08-13 MED ORDER — ROSUVASTATIN CALCIUM 40 MG PO TABS: 40.0000 mg | ORAL_TABLET | Freq: Every day | ORAL | 4 refills | Status: DC

## 2021-08-13 MED ORDER — VITAMIN B-12 1000 MCG PO TABS: 1000.0000 ug | ORAL_TABLET | Freq: Every day | ORAL | 4 refills | Status: DC

## 2021-08-13 NOTE — Progress Notes (Signed)
Contacted via [place] -- not sure he checks this though, please call him to alert of below: Evan [name], your labs have returned: - CBC continues to be stable, showing no anemia. - Kidney and liver function remain normal. - A1c, diabetes testing, is staying stable at 5.9% -- continue your medications. - Cholesterol levels continue to show LDL above goal for stroke prevention, I am going to stop your Atorvastatin and start Rosuvastatin to see if we can get better control.  I will get this added to your pill packs. - B12 level and Vitamin D remain on lower side -- continue supplements which should be in pill packs.  I will reorder to ensure they are. - Thyroid is normal.  Have a great day!! Keep being amazing!!  Thank you for allowing me to participate in your care.  I appreciate you. Kindest regards, Jaleena Viviani

## 2021-08-15 ENCOUNTER — Ambulatory Visit: Payer: Self-pay

## 2021-08-15 NOTE — Telephone Encounter (Signed)
No triage upset that may get evicted  stated that he has atrial fibrillation but says that he is not having sx.

## 2021-08-16 ENCOUNTER — Telehealth: Payer: Self-pay

## 2021-08-16 ENCOUNTER — Telehealth: Payer: Self-pay | Admitting: Nurse Practitioner

## 2021-08-16 NOTE — Telephone Encounter (Signed)
1010 am.  Request received from Ralene Bathe, NP to contact patient due to multiple calls from patient.  Phone call made to patient to follow up.  Patient conversation ranges from maintenance worker videoing him to his stroke occurring because roaches were in the home.  Advised patient that Palliative Care is unable to assist with these issues.  I have encouraged him to follow up with his LCSW through PCP office.   Phone call made to [facility], message left requesting a call back.

## 2021-08-16 NOTE — Telephone Encounter (Signed)
Noted, he will need to talk to SW for further assist on this.

## 2021-08-16 NOTE — Telephone Encounter (Signed)
Copied from [place] [id number]. Topic: General - Other >> Aug 16, 2021 11:45 AM Tessa Lerner A wrote: Reason for CRM: The patient shares that their home caregiver has declined to provide coverage for them at the time   The patient has been directed to contact their social worker to discuss further   Please contact further when available

## 2021-08-17 ENCOUNTER — Emergency Department
Admission: EM | Admit: 2021-08-17 | Discharge: 2021-08-17 | Disposition: A | Discharge status: Home / Self Care | Payer: Medicare Other | Attending: Emergency Medicine | Admitting: Emergency Medicine

## 2021-08-17 ENCOUNTER — Telehealth: Payer: Self-pay | Admitting: Nurse Practitioner

## 2021-08-17 ENCOUNTER — Emergency Department: Payer: Medicare Other

## 2021-08-17 ENCOUNTER — Ambulatory Visit (INDEPENDENT_AMBULATORY_CARE_PROVIDER_SITE_OTHER): Payer: Medicaid Other | Admitting: Licensed Clinical Social Worker

## 2021-08-17 DIAGNOSIS — I5032 Chronic diastolic (congestive) heart failure: Secondary | ICD-10-CM

## 2021-08-17 DIAGNOSIS — M810 Age-related osteoporosis without current pathological fracture: Secondary | ICD-10-CM

## 2021-08-17 DIAGNOSIS — R202 Paresthesia of skin: Secondary | ICD-10-CM | POA: Diagnosis not present

## 2021-08-17 DIAGNOSIS — M79603 Pain in arm, unspecified: Secondary | ICD-10-CM | POA: Diagnosis not present

## 2021-08-17 DIAGNOSIS — F200 Paranoid schizophrenia: Secondary | ICD-10-CM

## 2021-08-17 DIAGNOSIS — I25118 Atherosclerotic heart disease of native coronary artery with other forms of angina pectoris: Secondary | ICD-10-CM

## 2021-08-17 DIAGNOSIS — Z743 Need for continuous supervision: Secondary | ICD-10-CM | POA: Diagnosis not present

## 2021-08-17 DIAGNOSIS — M25521 Pain in right elbow: Secondary | ICD-10-CM | POA: Insufficient documentation

## 2021-08-17 DIAGNOSIS — M79601 Pain in right arm: Secondary | ICD-10-CM | POA: Diagnosis not present

## 2021-08-17 DIAGNOSIS — H579 Unspecified disorder of eye and adnexa: Secondary | ICD-10-CM | POA: Diagnosis not present

## 2021-08-17 DIAGNOSIS — E1159 Type 2 diabetes mellitus with other circulatory complications: Secondary | ICD-10-CM

## 2021-08-17 DIAGNOSIS — M25529 Pain in unspecified elbow: Secondary | ICD-10-CM

## 2021-08-17 DIAGNOSIS — J449 Chronic obstructive pulmonary disease, unspecified: Secondary | ICD-10-CM

## 2021-08-17 DIAGNOSIS — M79621 Pain in right upper arm: Secondary | ICD-10-CM | POA: Diagnosis not present

## 2021-08-17 DIAGNOSIS — M19021 Primary osteoarthritis, right elbow: Secondary | ICD-10-CM | POA: Diagnosis not present

## 2021-08-17 IMAGING — US US EXTREM  UP VENOUS*R*
1 series · 13 of 24 positions shown · non-contrast
Comparison: None.

CLINICAL DATA: Arm pain



[Series 1: us venous img upper uni right (dvt) · portal-venous · 13 of 34 slices shown]
[im 1/34]
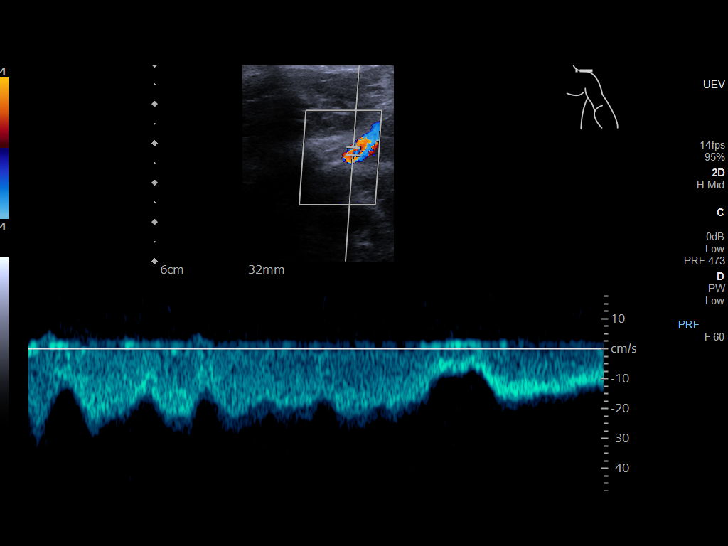
[im 3/34]
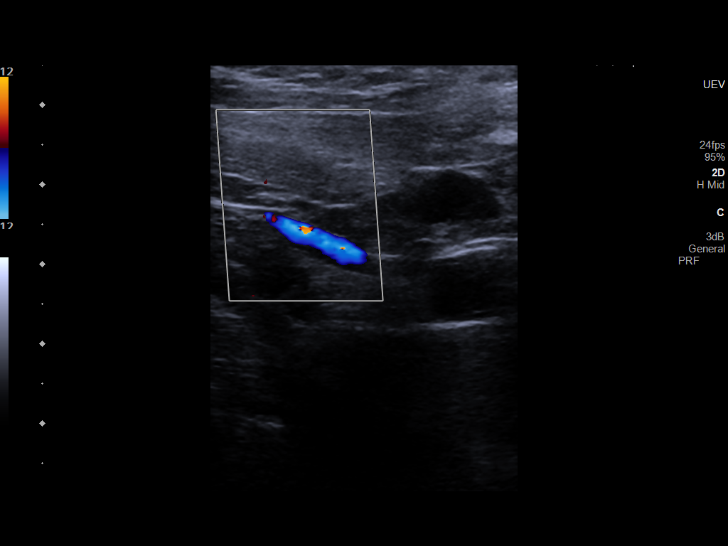
[im 6/34]
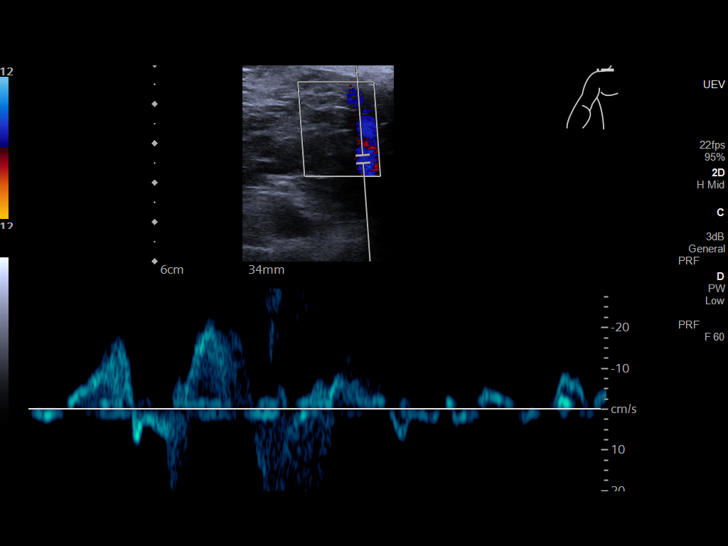
[im 9/34]
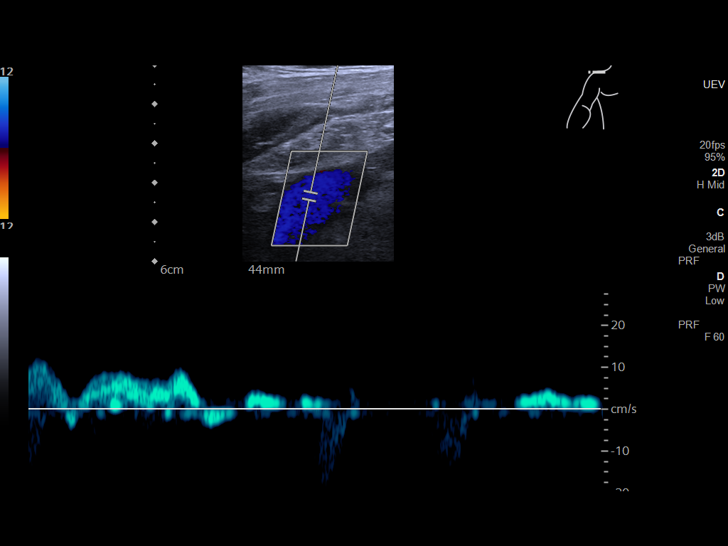
[im 12/34]
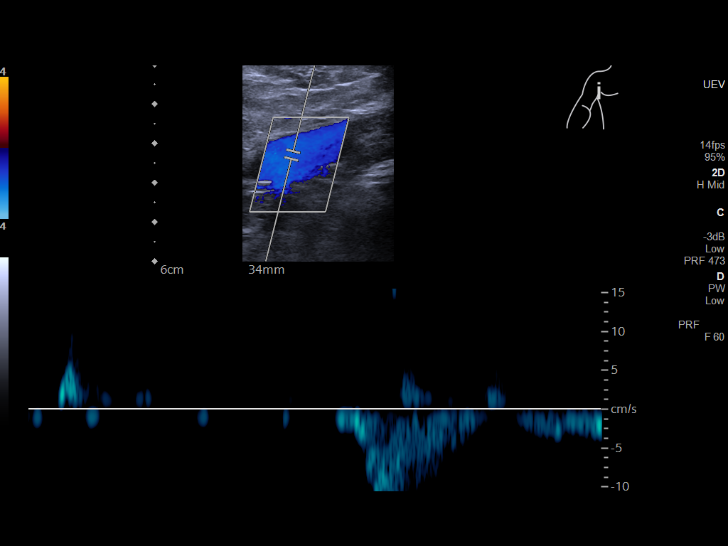
[im 15/34]
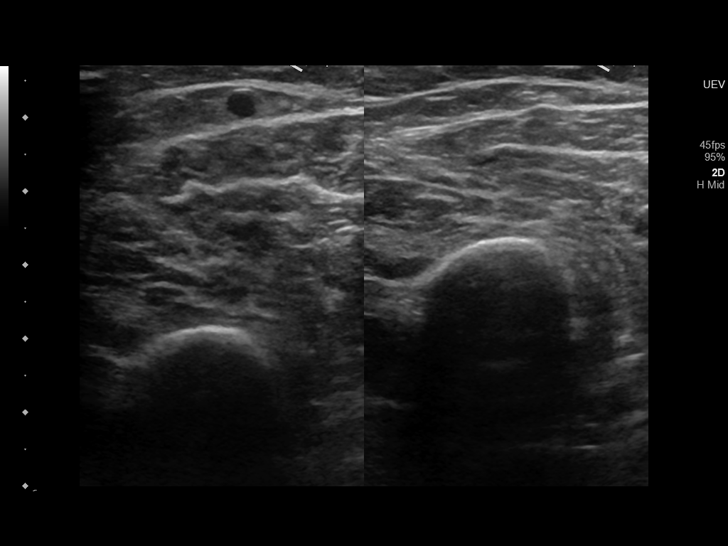
[im 18/34]
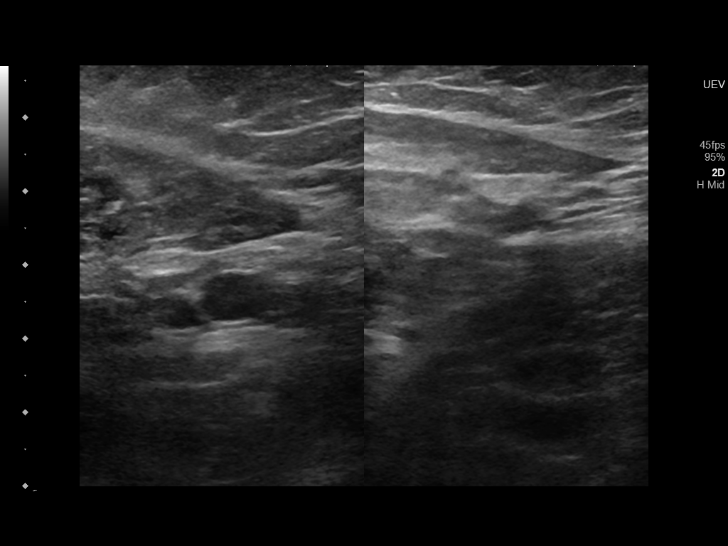
[im 19/34]
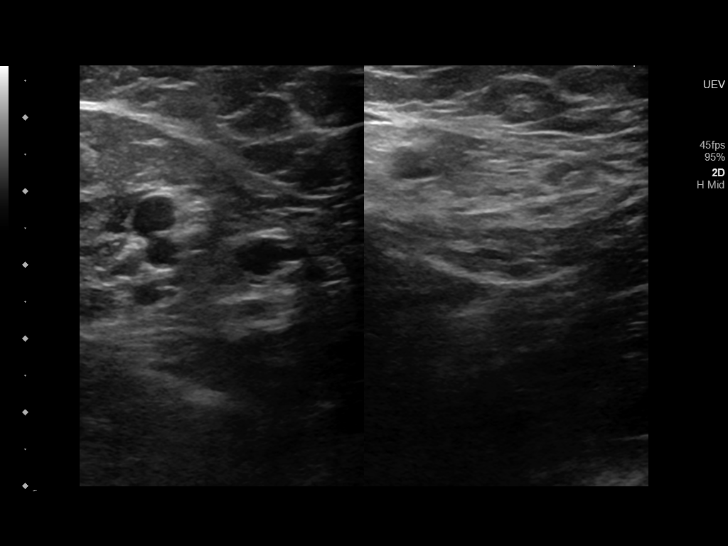
[im 22/34]
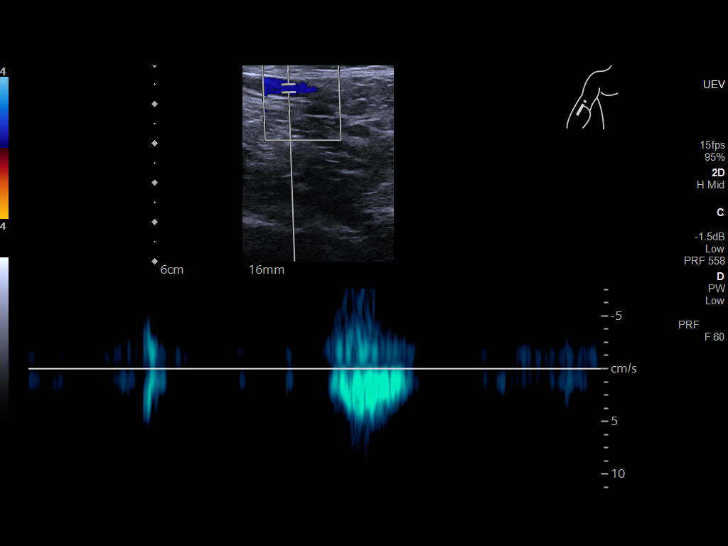
[im 25/34]
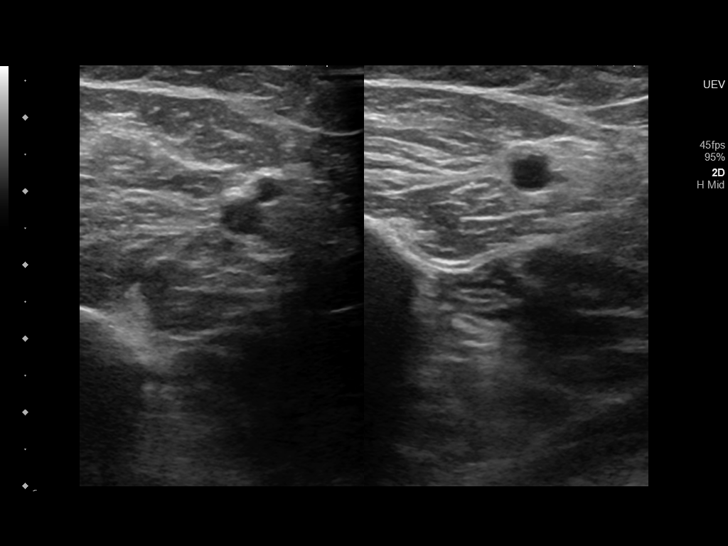
[im 28/34]
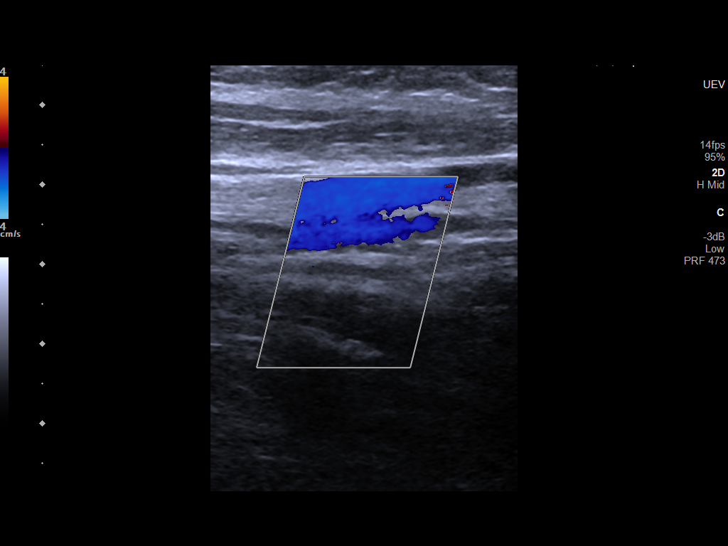
[im 31/34]
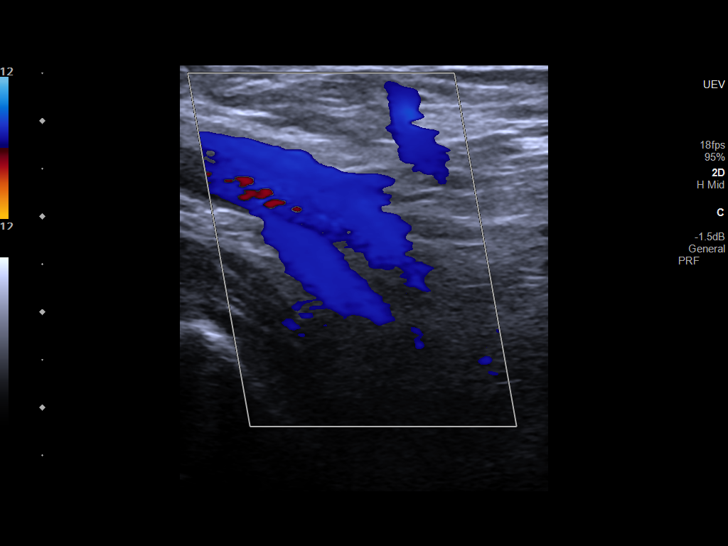
[im 34/34]
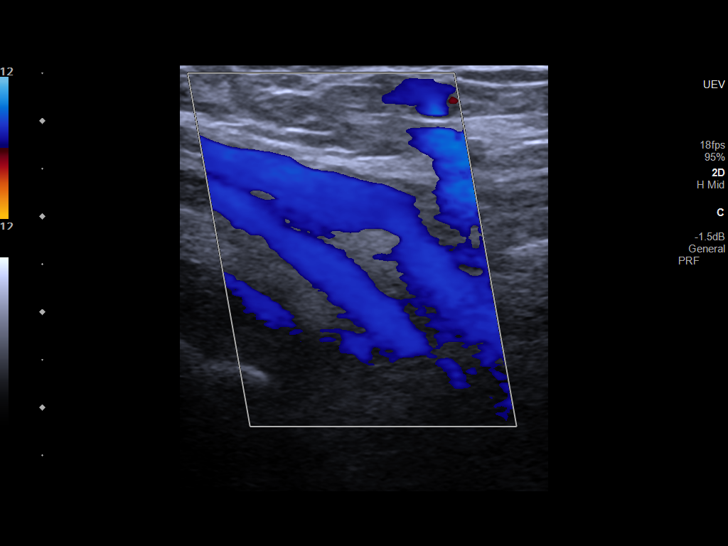

[13 of 24 positions shown; findings below may reference images not displayed]

FINDINGS: Contralateral Subclavian Vein: Respiratory phasicity is normal and
symmetric with the symptomatic side. No evidence of thrombus. Normal
compressibility.

Internal Jugular Vein: No evidence of thrombus. Normal
compressibility, respiratory phasicity and response to augmentation.

Subclavian Vein: No evidence of thrombus. Normal compressibility,
respiratory phasicity and response to augmentation.

Axillary Vein: No evidence of thrombus. Normal compressibility,
respiratory phasicity and response to augmentation.

Cephalic Vein: No evidence of thrombus. Normal compressibility,
respiratory phasicity and response to augmentation.

Basilic Vein: No evidence of thrombus. Normal compressibility,
respiratory phasicity and response to augmentation.

Brachial Veins: No evidence of thrombus. Normal compressibility,
respiratory phasicity and response to augmentation.

Radial Veins: No evidence of thrombus. Normal compressibility,
respiratory phasicity and response to augmentation.

Ulnar Veins: No evidence of thrombus. Normal compressibility,
respiratory phasicity and response to augmentation.

Venous Reflux:  None visualized.

Other Findings:  None visualized.
IMPRESSION: No evidence of DVT within the right upper extremity.

## 2021-08-17 IMAGING — DX DG ELBOW COMPLETE 3+V*R*
4 series · 4 of 4 positions shown · non-contrast
Comparison: None.

CLINICAL DATA: Elbow pain.

EXAM:
RIGHT ELBOW - COMPLETE 3+ VIEW

[elbow ap]
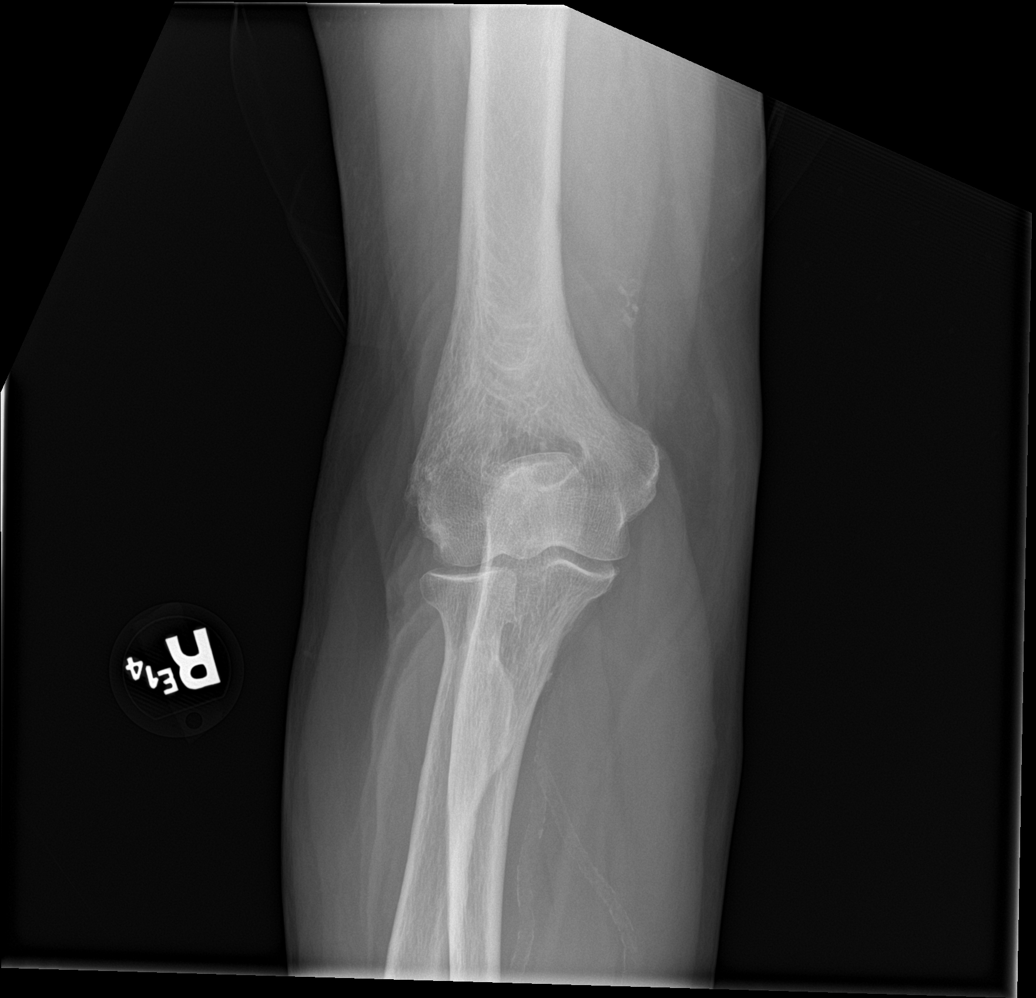

[elbow obl (1 of 2)]
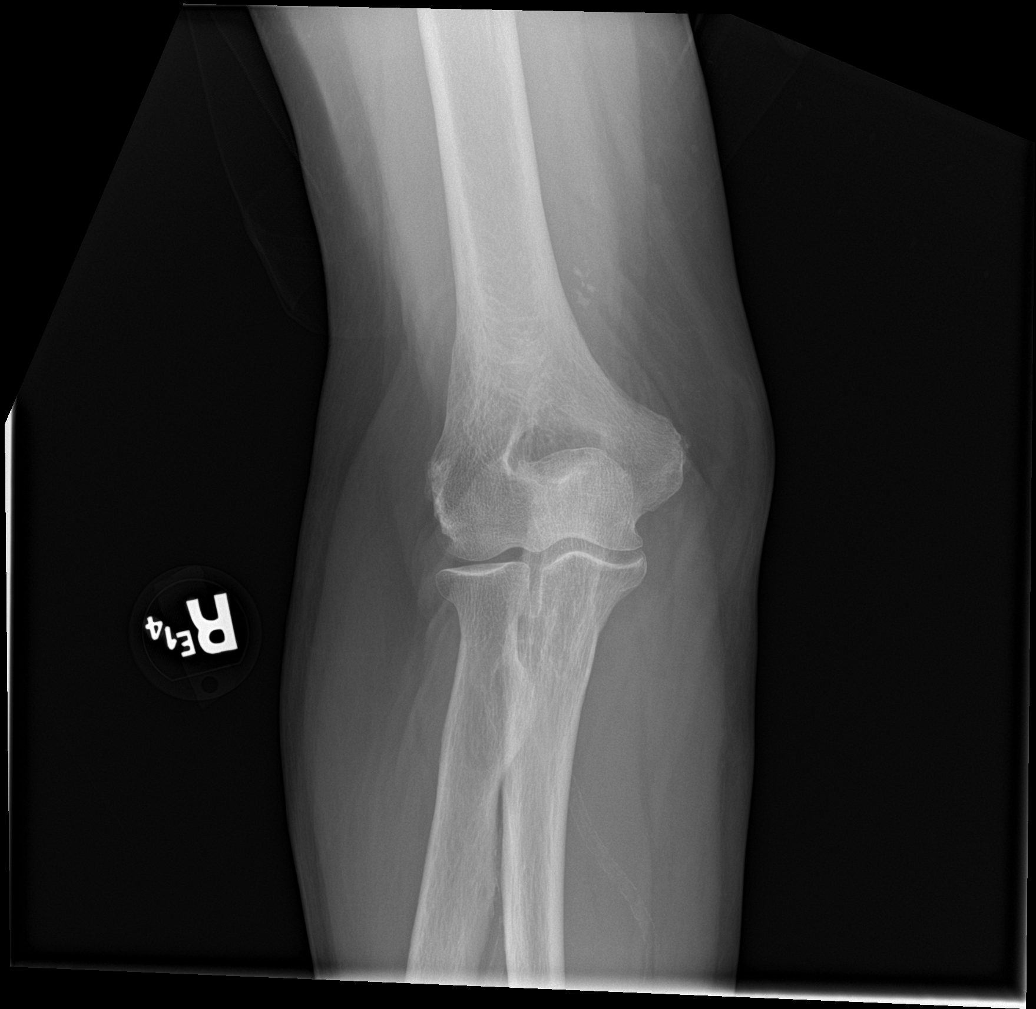

[elbow obl (2 of 2)]
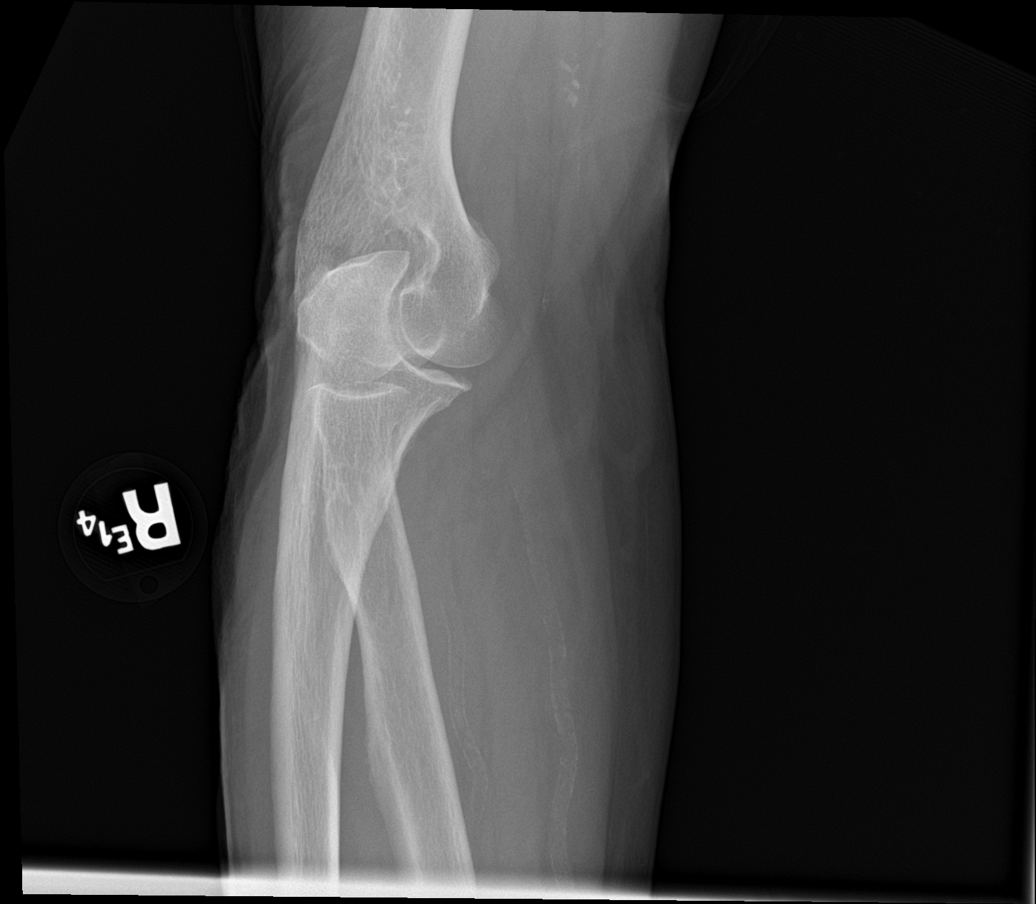

[elbow lat]
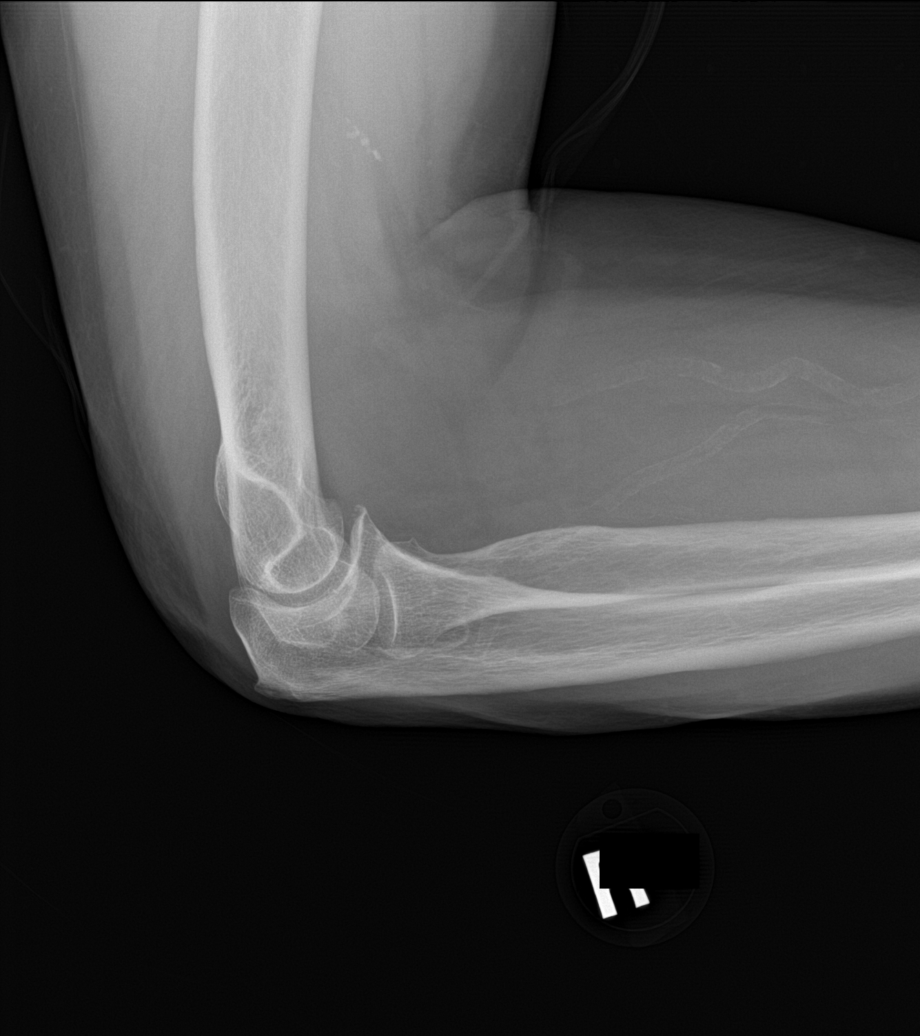

[4 of 4 positions shown; findings below may reference images not displayed]

FINDINGS: No acute fracture, dislocation, or elbow joint effusion is
identified. Very mild degenerative spurring is noted at the elbow.
Atherosclerotic vascular calcifications are noted.
IMPRESSION: No acute osseous abnormality.

## 2021-08-17 MED ORDER — ACETAMINOPHEN 325 MG PO TABS
650.0000 mg | ORAL_TABLET | Freq: Once | ORAL | Status: AC
Start: 2021-08-17 — End: 2021-08-17
  Administered 2021-08-17: 650 mg via ORAL
  Filled 2021-08-17: qty 2

## 2021-08-17 MED ORDER — PREDNISONE 50 MG PO TABS: 50.0000 mg | ORAL_TABLET | Freq: Every day | ORAL | 0 refills | Status: DC
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
## 2021-08-17 NOTE — Telephone Encounter (Signed)
Copied from [place] [id number]. Topic: General - Other >> Aug 17, 2021 11:18 AM Yvette Rack wrote: Reason for CRM: Pt requests return call from [facility] to discuss information about a program like [employer] Whe[employer].

## 2021-08-17 NOTE — ED Provider Triage Note (Signed)
Emergency Medicine Provider Triage Evaluation Note  Evan [name] , a 66 y.o. male  was evaluated in triage.  Pt complains of right arm pain, history of vasculitis.  Review of Systems  Positive: Right arm Negative: Injury, fever, chills  Physical Exam  There were no vitals taken for this visit. Gen:   Awake, no distress   Resp:  Normal effort  MSK:   Moves extremities without difficulty  Other:    Medical Decision Making  Medically screening exam initiated at 2:45 PM.  Appropriate orders placed.  Brode L Marolf was informed that the remainder of the evaluation will be completed by another provider, this initial triage assessment does not replace that evaluation, and the importance of remaining in the ED until their evaluation is complete.     Versie Starks, PA-C 08/17/21 1446

## 2021-08-17 NOTE — ED Triage Notes (Signed)
Pt reports that he has a history of vasculitis. Yesterday pt reports he began having pain to rt AC vein. Today the pain returned. Pt denies numbness. Denies other sx's. States that he needs pain medicine.

## 2021-08-17 NOTE — ED Triage Notes (Signed)
Pt to ED via EMS from home. CC of BUE pain and numbness to face that has now resolved.  BP 113/69 95Afib with hx of same O2 97%  3 81mg  ASA PTA.

## 2021-08-18 ENCOUNTER — Telehealth: Payer: Self-pay | Admitting: Nurse Practitioner
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
## 2021-08-19 ENCOUNTER — Telehealth: Payer: Medicare Other
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
## 2021-08-22 ENCOUNTER — Telehealth: Payer: Self-pay | Admitting: *Deleted

## 2021-08-22 ENCOUNTER — Telehealth: Payer: Self-pay
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
## 2021-08-22 NOTE — Patient Instructions (Addendum)
Visit Information  Thank you for taking time to visit with me today. Please don't hesitate to contact me if I can be of assistance to you before our next scheduled telephone appointment.  Following are the goals we discussed today:  Patient Goals/Self-Care Activities: Over the next 120 days Attend all scheduled appointments with providers Contact office with any questions or concerns Utilize [facility] transportation number to assist with attending medical appointments Practice relaxation or meditation daily Practice positive thinking and self-talk  Our next appointment is by telephone on 08/19/21  Please call the care guide team at [phone] if you need to cancel or reschedule your appointment.   If you are experiencing a Mental Health or [place] or need someone to talk to, please call the Suicide and Crisis Lifeline: 988 call 911   Patient verbalizes understanding of instructions and care plan provided today and agrees to view in [place]. Active MyChart status confirmed with patient.    [name], MSW, [email or web address] Phone [phone] 6:16 AM

## 2021-08-22 NOTE — Chronic Care Management (AMB) (Signed)
Chronic Care Management    Clinical Social Work Note  08/22/2021 Name: Evan [name] MRN: [id number] DOB: Feb 10, 1956  [name] Testa is a 66 y.o. year old male who is a primary care patient of [name], [name], NP. The CCM team was consulted to assist the patient with chronic disease management and/or care coordination needs related to: Transportation Needs , Level of Care Concerns, and Mental Health Counseling and Resources.   Engaged with patient by telephone for follow up visit in response to provider referral for social work chronic care management and care coordination services.   Consent to Services:  The patient was given information about Chronic Care Management services, agreed to services, and gave verbal consent prior to initiation of services.  Please see initial visit note for detailed documentation.   Patient agreed to services and consent obtained.    Summary:  Patient continues to maintain positive progress with care plan goals. CCM LCSW completed a referral to Care Guide to assist patient with scheduling transportation to upcoming appt on 09/08/21.  See Care Plan below for interventions and patient self-care activities.  Recommendation: Patient may benefit from, and is in agreement work with LCSW to address care coordination needs and will continue to work with the clinical team to address health care and disease management related needs.   Follow up Plan: Patient would like continued follow-up from CCM LCSW.  per patient's request will follow up in 1 week.  Will call office if needed prior to next encounter.    SDOH (Social Determinants of Health) assessments and interventions performed:    Advanced Directives Status: Not addressed in this encounter.  CCM Care Plan  Allergies  Allergen Reactions   Penicillin G Hives   Sulfa Antibiotics Hives   Tiotropium    Zoloft [Sertraline Hcl] Other (See Comments)    Outpatient Encounter Medications as of 08/17/2021   Medication Sig   acetaminophen (TYLENOL) 500 MG tablet Take 500 mg by mouth every 6 (six) hours as needed.   albuterol (VENTOLIN HFA) 108 (90 Base) MCG/ACT inhaler Inhale 2 puffs into the lungs every 4 (four) hours as needed for wheezing or shortness of breath.   aspirin 81 MG chewable tablet Chew 1 tablet (81 mg total) by mouth daily.   Blood Glucose Monitoring Suppl (ONETOUCH VERIO) w/Device KIT Use to check blood sugar 3 to 4 times a day and document.  Please bring to visits for review.   Cholecalciferol 125 MCG (5000 UT) TABS Take 1 tablet (5,000 Units total) by mouth daily.   Elastic Bandages & Supports (MEDICAL COMPRESSION STOCKINGS) MISC 1 Package by Does not apply route daily.   empagliflozin (JARDIANCE) 10 MG TABS tablet Take 1 tablet (10 mg total) by mouth daily.   ezetimibe (ZETIA) 10 MG tablet Take 1 tablet (10 mg total) by mouth daily.   furosemide (LASIX) 40 MG tablet TAKE 2 TABLETS BY MOUTH ONCE EVERY MORNING AND 1 TABLET ONCE EVERY EVENING   glucose blood (ONETOUCH VERIO) test strip Use to check blood sugar 3 to 4 times a day.   metoprolol succinate (TOPROL XL) 50 MG 24 hr tablet Take 1 tablet (50 mg total) by mouth daily.   nitroGLYCERIN (NITROSTAT) 0.4 MG SL tablet Place 1 tablet (0.4 mg total) under the tongue every 5 (five) minutes as needed for chest pain.   nystatin (MYCOSTATIN/NYSTOP) powder Apply 1 application topically 3 (three) times daily.   olmesartan (BENICAR) 5 MG tablet Take 2 tablets (10 mg total) by  mouth daily.   Omega-3 Fatty Acids (OMEGA 3 500 PO) Take 500 mg by mouth daily.   OneTouch Delica Lancets 25D MISC Use to check blood sugar 2-3 times a day.   pantoprazole (PROTONIX) 40 MG tablet Take 1 tablet (40 mg total) by mouth daily.   predniSONE (DELTASONE) 50 MG tablet Take 1 tablet (50 mg total) by mouth daily with breakfast.   risperiDONE (RISPERDAL) 0.5 MG tablet Take 1 tablet (0.5 mg total) by mouth 2 (two) times daily.   rivaroxaban (XARELTO) 20 MG TABS  tablet Take 1 tablet (20 mg total) by mouth at bedtime.   rosuvastatin (CRESTOR) 40 MG tablet Take 1 tablet (40 mg total) by mouth daily.   Saccharomyces boulardii (PROBIOTIC) 250 MG CAPS Take 1 capsule by mouth in the morning and at bedtime.   sharps container 1 each by Does not apply route as needed.   Skin Protectants, Misc. (EUCERIN) cream Apply topically as needed for dry skin.   SYMBICORT 80-4.5 MCG/ACT inhaler INHALE 2 PUFFS BY MOUTH TWICE DAILY   triamcinolone cream (KENALOG) 0.1 % APPLY TO AFFECTED AREAS TWICE DAILY   vitamin B-12 (CYANOCOBALAMIN) 1000 MCG tablet Take 1 tablet (1,000 mcg total) by mouth daily.   No facility-administered encounter medications on file as of 08/17/2021.    Patient Active Problem List   Diagnosis Date Noted   Memory changes 08/12/2021   Other thrombophilia ([place]) 12/25/2020   Vitamin D deficiency 12/04/2020   Vitamin B12 deficiency 12/04/2020   Stenosis of right carotid artery 12/03/2020   History of CVA (cerebrovascular accident) 11/06/2020   History of 2019 novel coronavirus disease (COVID-19) 09/27/2020   Atherosclerosis of aorta ([place]) 12/04/2019   Persistent atrial fibrillation ([place]) 09/12/2019   CAD (coronary artery disease) 09/12/2019   Chronic venous stasis 09/12/2019   Hoarding disorder 09/12/2019   Cervical spinal stenosis 09/12/2019   Diabetic retinopathy ([place]) 09/12/2019   COPD, mild ([place]) 09/12/2019   Osteoporosis 09/09/2019   History of prostate cancer 09/09/2019   Paranoid type schizophrenia ([place]) 08/10/2019   Obstructive sleep apnea on CPAP 08/10/2019   Hyperlipidemia associated with type 2 diabetes mellitus ([place]) 08/10/2019   Chronic diastolic CHF (congestive heart failure) ([place]) 01/09/2014   Esophageal dysmotility 09/12/2013   Type 2 diabetes mellitus with morbid obesity ([place]) 02/14/2012   Obesity, morbid (more than 100 lbs over ideal weight or BMI > 40) ([place]) 07/26/2011   OLD MYOCARDIAL INFARCTION 03/02/2010   Hypertension  associated with diabetes ([place]) 03/20/2009   CORONARY ATHEROSCLEROSIS, ARTERY BYPASS GRAFT 03/20/2009    Conditions to be addressed/monitored: CAD, DMII, and Anxiety; Transportation and Mental Health Concerns   Care Plan : General Social Work (Adult)  Updates made by [name] D, LCSW since 08/22/2021 12:00 AM     Problem: Response to Treatment (Depression)      Goal: Response to Treatment Maximized   Start Date: 12/27/2020  This Visit's Progress: On track  Recent Progress: On track  Priority: High  Note:   Current barriers:   Acute Mental Health needs related to Anxiety Housing barriers, Level of care concerns, and Mental Health Concerns  Needs Support, Education, and Care Coordination in order to meet unmet mental health needs. Clinical Goal(s): Over the next 120 days, patient will work with SW to reduce or manage symptoms of anxiety and increase knowledge and/or ability of: coping skills, healthy habits, self-management skills, and stress reduction.until connected for ongoing counseling.  Clinical Interventions:  Assessed patient's previous treatment, needs, coping skills, current  treatment, support system and barriers to care Patient interviewed and appropriate assessments performed Provided mental health counseling with regard to managing mental health conditions Patient reports difficulty managing symptoms of anxiety triggered by chronic health conditions Concerns that rent isn't paid for. Concerns for not passing inspection and it needed to be completed 06/23/21 [name] is his leasing agent who contacted patient while CCM LCSW was on phone and shared that she will work with patient on meeting inspection. Scheduled to visit home on Tuesday, 06/28/21 11/28: Patient reports [name] has assisted him with passing the inspection 01/11: CCM LCSW discussed benefits of obtaining a Guardian through DSS to assist with paying rent, utilities, etc. Patient denies need for any resources stating he  has no concerns with paying rent Patient is upset about the way he is treated by multiple personal aids, stating that current aid is dishonest about why she does not show to work. CCM LCSW strongly encouraged patient to contact agency about his concerns to reach a resolution 09/08: [name] is back as his guide for 27 hrs a week [place] [phone] 09/26: Patient informed CCM LCSW that [name] will provide transportation for his upcoming PCP appt scheduled 05/11/21. This information was verified by [name], pt's new aid that will be assisting with ADL's in the home on Mondays, Wednesdays, and Fridays 11/1: Patient reports that he is no longer receiving services from [facility]. He has contacted [name] and is awaiting paperwork regarding home care agencies 11/8: Patient reports inability to work with [name] and her home aids. He has contacted [place] and requested to work with a new agency. Patient reports he has met with a new aid and will have an assessment completed within a week 11/28: Patient is not interested in ALF or SNF. He prefers to continue residing independently. CCM LCSW will follow up with pt regarding process of obtaining new aid through [facility] 11/30: Patient was strongly encouraged to re-consider ALF to provide improved socialization and support 12/22: CCM LCSW addressed reported concerns regarding bank fraud and alleged risks of eviction due to payment confusion. Patient is adamant that he is not interested in leaving current residence. States he feels safe in the home, despite reports of others having access to his home without his permission 01/11: Patient is satisfied with services provided by [facility]. LCSW informed him of his next scheduled home visit Patient receives visits from neighbors which promotes mood CCM LCSW discussed CBT strategies to assist with managing symptoms  Patient reports recent attempt for bank fraud. The bank restored his account and are  sending new cards. Patient identified strategies to prevent future scams, including, locking his belongings in a safe at home, feels previous aids were stealing his info 12/22: CCM LCSW discussed with patient the importance of contacting Law Enforcement if he has concerns for safety/fraud Validation and encouragement was provided during call. CCM LCSW encouraged patient to continue reflecting on past experiences and current friendships to promote positive mood and stress management. States he has enjoyed playing cards online with friends and praying to cope with stressors. 10/10 Patient shared that recent stress has been alleviated with drawing and talking with family and friends  CCM LCSW provided a supportive environment to allow patient to process his emotions Patient reports compliance with medications. Patient utilizes medicine cabinet with labels, AM and PM, to assist with medication management Patient reports that his strong spiritual relationship keeps him encouraged to move forward instead of focusing on past events 11/1: Patient enjoys  walking to assist with stress management Patient reports becoming stir crazy from staying at home for majority of the day. CCM LCSW discussed benefits of participating in [facility]. Patient reports hx of attending [facility]. States that he disenrolled because staff and participants believed he was delusional because he has close relationships with various celebrities. Patient is not interested in initiating psychiatry and/or counseling to assist with strengthening support system 09/08: Patient reports that he participates in a group weekly to promote socialization with others 12/22: States he feels "liberated" without caregivers because he no longer has things stolen from him CCM LCSW discussed strategies to improve communication skills with aid to assist in preventing misunderstandings or offending others  CCM LCSW reviewed upcoming appointments with patient. He reports  that he is utilizing [employer] for transportation. His aid has offered to bring patient to local stores to obtain cleaning supplies. Patient reports that he is aware that he can also utilize [employer] for transportation to medical appointments 10/10: Patient has a ride scheduled for 10/18 to take him grocery shopping 01/11: CCM LCSW completed a referral to Care Guide to assist patient with scheduling transportation to upcoming appt on 09/08/21 CCM LCSW discussed strategies to assist patient with memory concerns. Patient agreed to write notes and post them in high traffic areas of the home to remind him of upcoming appts and/or medications CCM LCSW collaborated with PCP and CCM RN regarding concerns of medication adherence. LCSW informed RNCM of patient's request for a f/up call regarding questions about his cholesterol levels and strategies to manage condition Other interventions: Solution-Focused Strategies, Mindfulness or Relaxation Training, Active listening / Reflection utilized , Emotional Supportive Provided, Psychoeducation for mental health needs , Motivational Interviewing, Participation in counseling encouraged , Participation in support group encouraged , Consideration of in-home help encouraged , and Verbalization of feelings encouraged   Discussed plans with patient for ongoing care management follow up and provided patient with direct contact information for care management team Collaboration with PCP regarding development and update of comprehensive plan of care as evidenced by provider attestation and co-signature Inter-disciplinary care team collaboration (see longitudinal plan of care) Patient Goals/Self-Care Activities: Over the next 120 days Attend all scheduled appointments with providers Contact office with any questions or concerns Utilize [facility] transportation number to assist with attending medical appointments Practice relaxation or meditation daily Practice positive thinking  and self-talk       [name], MSW, [email or web address] Phone [phone] 6:14 AM

## 2021-08-23 ENCOUNTER — Emergency Department: Payer: Medicare Other

## 2021-08-23 ENCOUNTER — Telehealth: Payer: Self-pay

## 2021-08-23 ENCOUNTER — Emergency Department
Admission: EM | Admit: 2021-08-23 | Discharge: 2021-08-24 | Disposition: A | Discharge status: Home / Self Care | Payer: Medicare Other | Attending: Student in an Organized Health Care Education/Training Program | Admitting: Student in an Organized Health Care Education/Training Program

## 2021-08-23 DIAGNOSIS — R0789 Other chest pain: Secondary | ICD-10-CM | POA: Insufficient documentation

## 2021-08-23 DIAGNOSIS — R7989 Other specified abnormal findings of blood chemistry: Secondary | ICD-10-CM | POA: Diagnosis not present

## 2021-08-23 DIAGNOSIS — I499 Cardiac arrhythmia, unspecified: Secondary | ICD-10-CM | POA: Diagnosis not present

## 2021-08-23 DIAGNOSIS — R6889 Other general symptoms and signs: Secondary | ICD-10-CM | POA: Diagnosis not present

## 2021-08-23 DIAGNOSIS — R002 Palpitations: Secondary | ICD-10-CM | POA: Insufficient documentation

## 2021-08-23 DIAGNOSIS — I509 Heart failure, unspecified: Secondary | ICD-10-CM | POA: Diagnosis not present

## 2021-08-23 DIAGNOSIS — R079 Chest pain, unspecified: Secondary | ICD-10-CM | POA: Diagnosis not present

## 2021-08-23 DIAGNOSIS — Z743 Need for continuous supervision: Secondary | ICD-10-CM | POA: Diagnosis not present

## 2021-08-23 LAB — CBC
HCT: 43.4 % (ref 39.0–52.0)
Hemoglobin: 14 g/dL (ref 13.0–17.0)
MCH: 28.8 pg (ref 26.0–34.0)
MCHC: 32.3 g/dL (ref 30.0–36.0)
MCV: 89.3 fL (ref 80.0–100.0)
Platelets: 240 10*3/uL (ref 150–400)
RBC: 4.86 MIL/uL (ref 4.22–5.81)
RDW: 14.7 % (ref 11.5–15.5)
WBC: 11.4 10*3/uL — ABNORMAL HIGH (ref 4.0–10.5)
nRBC: 0 % (ref 0.0–0.2)

## 2021-08-23 LAB — BASIC METABOLIC PANEL
Anion gap: 7 (ref 5–15)
BUN: 27 mg/dL — ABNORMAL HIGH (ref 8–23)
CO2: 22 mmol/L (ref 22–32)
Calcium: 9.1 mg/dL (ref 8.9–10.3)
Chloride: 106 mmol/L (ref 98–111)
Creatinine, Ser: 0.86 mg/dL (ref 0.61–1.24)
GFR, Estimated: >60 mL/min (ref >60)
Glucose, Bld: 113 mg/dL — ABNORMAL HIGH (ref 70–99)
Potassium: 4.8 mmol/L (ref 3.5–5.1)
Sodium: 135 mmol/L (ref 135–145)

## 2021-08-23 LAB — TROPONIN I (HIGH SENSITIVITY): Troponin I (High Sensitivity): 20 ng/L — ABNORMAL HIGH (ref <18)

## 2021-08-23 IMAGING — CR DG CHEST 2V
1 series · 2 of 2 positions shown · non-contrast
Comparison: Chest x-ray [DATE], CT chest [DATE]

CLINICAL DATA: chest pain

EXAM:
CHEST - 2 VIEW

[Series 1: dg chest 2 view · 0.14mm/px · 2 of 2 slices shown]
[im 1/2]
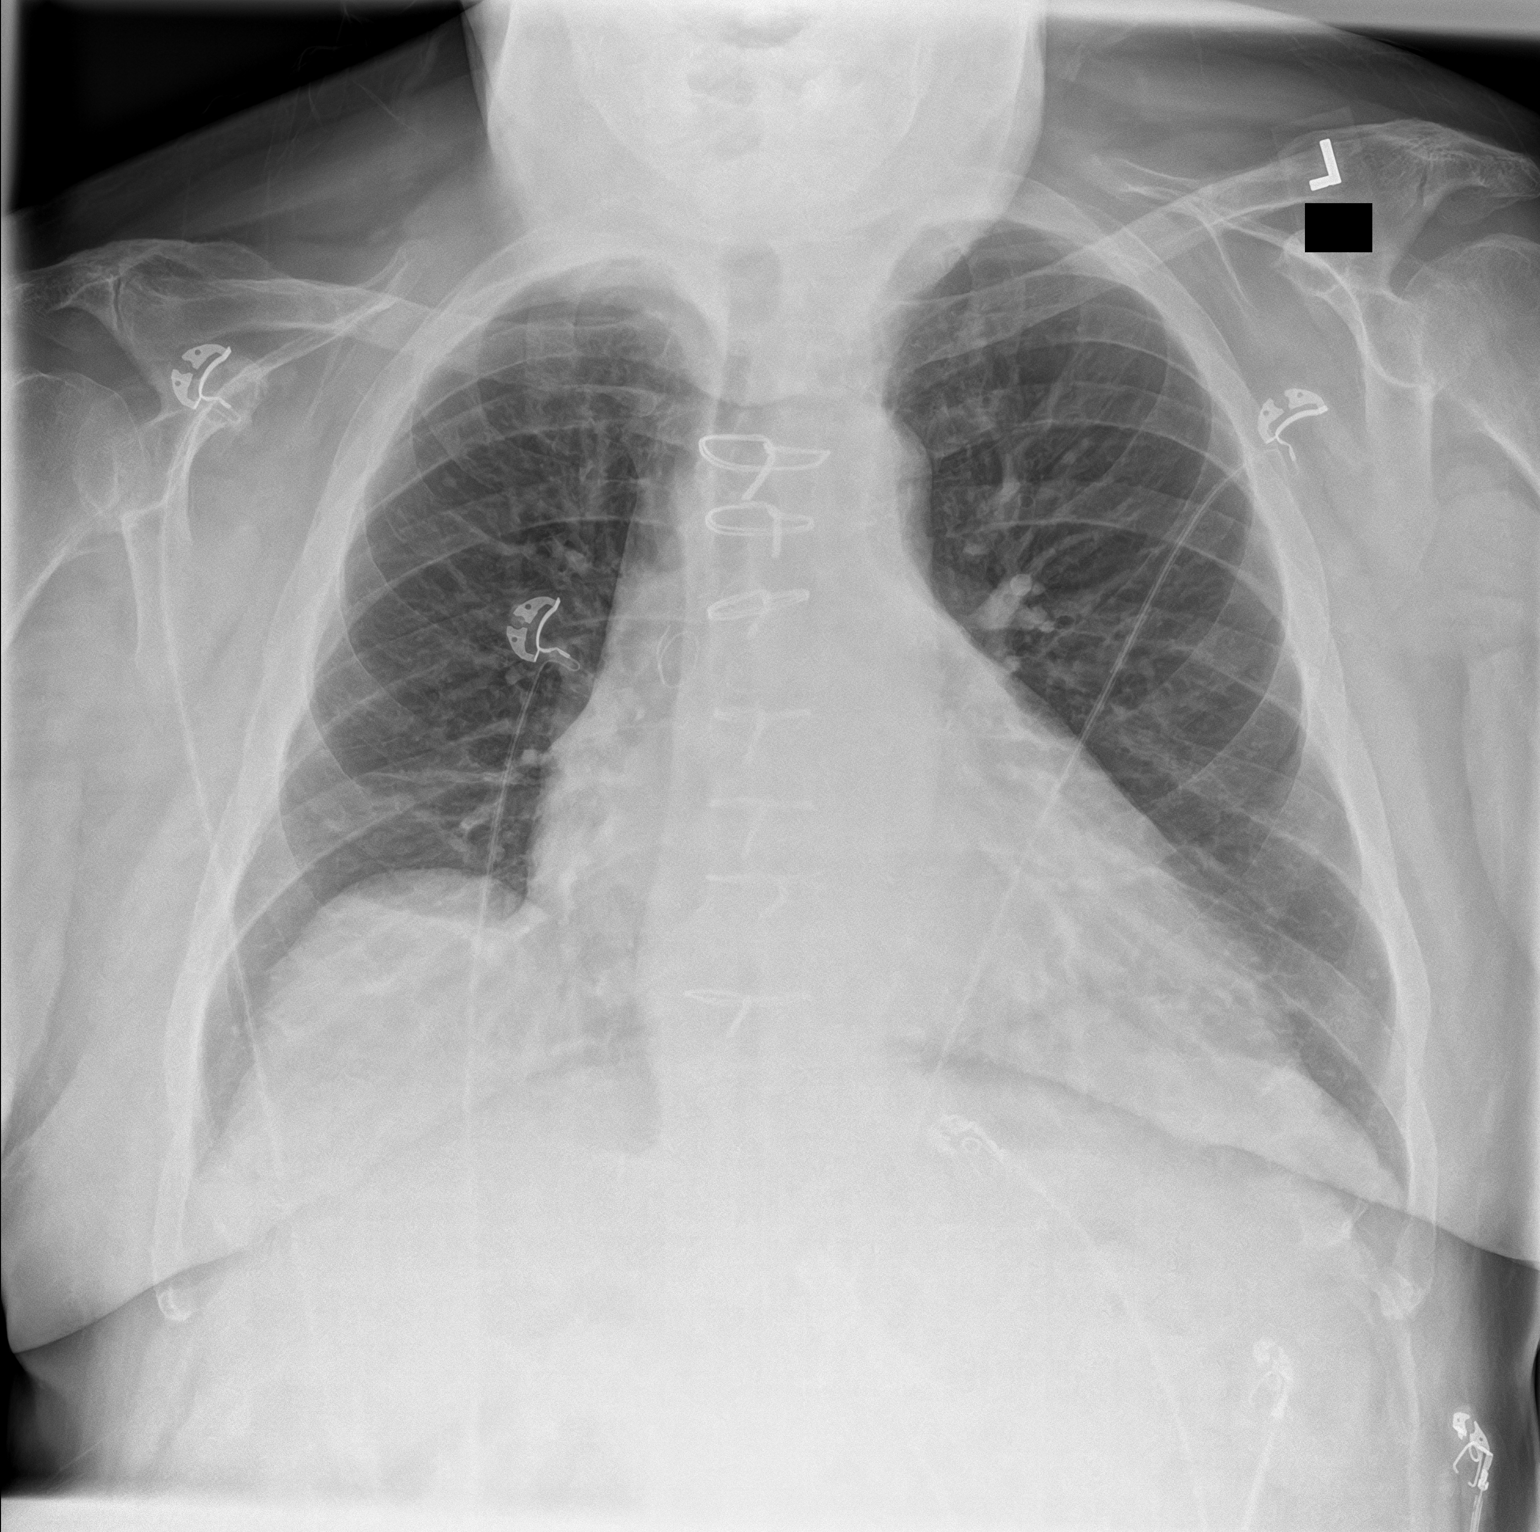
[im 2/2]
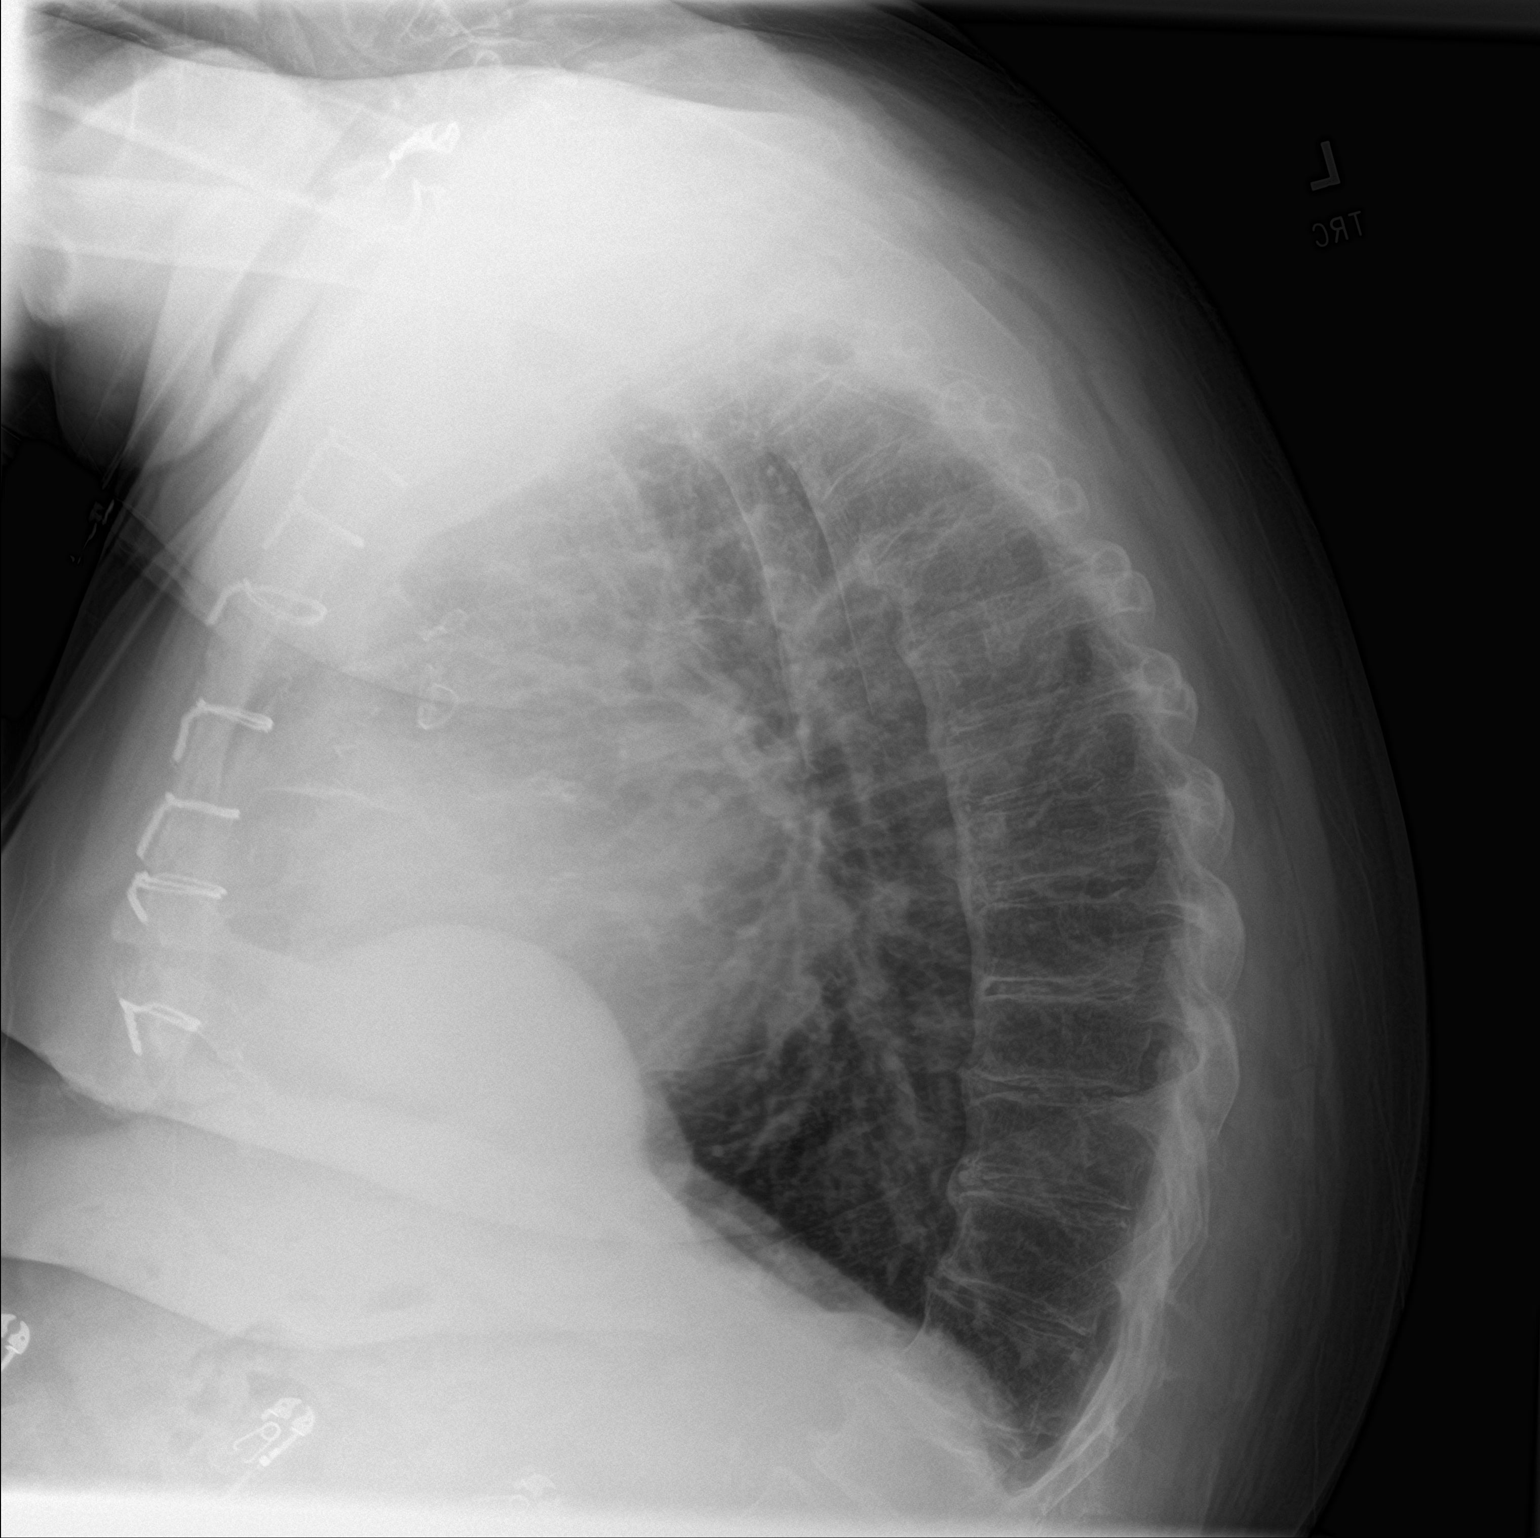

[2 of 2 positions shown; findings below may reference images not displayed]

FINDINGS: The heart and mediastinal contours are unchanged.

No focal consolidation. No pulmonary edema. Chronic blunting of the
costophrenic angle with no frank pleural effusion. No pneumothorax.

No acute osseous abnormality.
IMPRESSION: No active cardiopulmonary disease.

## 2021-08-23 NOTE — ED Provider Notes (Signed)
----------------------------------------- °  11:41 PM on 08/23/2021 -----------------------------------------  Blood pressure 127/75, pulse 73, temperature 97.6 F (36.4 C), temperature source Oral, resp. rate 16, SpO2 98 %.  Assuming care from Dr. Quentin Cornwall.  In short, Evan [name] is a 66 y.o. male with a chief complaint of Chest Pain .  Refer to the original H&P for additional details.  The current plan of care is to follow-up repeat troponin, if negative then patient would be appropriate for discharge home.  ----------------------------------------- 2:16 AM on 08/24/2021 ----------------------------------------- Repeat troponin is stable from previous and patient is chest pain-free at this time.  He is appropriate for discharge home with cardiology follow-up, was counseled to return to the ED for new worsening symptoms.  Patient agrees with plan.    Blake Divine, MD 08/24/21 [phone]

## 2021-08-23 NOTE — ED Triage Notes (Signed)
Patient from home via EMS for chest pain and shortness of breath today; a/o x 4; skin warm; respirations even non-labored

## 2021-08-23 NOTE — Telephone Encounter (Signed)
° °  Telephone encounter was:  Successful.  08/23/2021 Name: NASEAN ZAPF MRN: [id number] DOB: November 29, 1955  [name] Bruington is a 66 y.o. year old male who is a primary care patient of [name], [name], NP . The community resource team was consulted for assistance with Transportation Needs   Care guide performed the following interventions: Patient provided with information about care guide support team and interviewed to confirm resource needs.Called and set up his transportation needs for his doctor appointment  for 09/08/2021 through [facility]:  No further follow up planned at this time. The patient has been provided with needed resources.    [place], Care Management  [phone] [address], [place], [place] [address] Phone: [phone] Email: [email or web address]

## 2021-08-23 NOTE — Telephone Encounter (Signed)
[facility] requesting patient call back to discuss concerns with Barth Kirks, Practice Admin.

## 2021-08-24 ENCOUNTER — Telehealth: Payer: Self-pay | Admitting: Nurse Practitioner

## 2021-08-24 ENCOUNTER — Encounter: Payer: Self-pay | Admitting: Nurse Practitioner

## 2021-08-24 ENCOUNTER — Telehealth: Payer: Self-pay | Admitting: Cardiovascular Disease

## 2021-08-24 ENCOUNTER — Telehealth: Payer: Self-pay | Admitting: Licensed Clinical Social Worker

## 2021-08-24 DIAGNOSIS — R0789 Other chest pain: Secondary | ICD-10-CM | POA: Diagnosis not present

## 2021-08-24 LAB — TROPONIN I (HIGH SENSITIVITY): Troponin I (High Sensitivity): 19 ng/L — ABNORMAL HIGH (ref <18)

## 2021-08-24 MED ORDER — ACETAMINOPHEN 325 MG PO TABS
650.0000 mg | ORAL_TABLET | Freq: Once | ORAL | Status: AC
  Administered 2021-08-24: 650 mg via ORAL
  Filled 2021-08-24: qty 2

## 2021-08-24 NOTE — Telephone Encounter (Signed)
° °   Clinical Social Work  Care Management   Phone Outreach    08/24/2021 Name: ADDIEL MCCARDLE MRN: [id number] DOB: May 14, 1956  [name] Goga is a 66 y.o. year old male who is a primary care patient of [name], [name], NP .   Reason for referral: Mental Health Counseling and Resources.    F/U phone call today to assess needs, progress and barriers with care plan goals.   Telephone outreach was unsuccessful. A HIPPA compliant phone message was left for the patient providing contact information and requesting a return call.   Plan:CCM LCSW will wait for return call. If no return call is received, LCSW will make another attempt within 14 days  Review of patient status, including review of consultants reports, relevant laboratory and other test results, and collaboration with appropriate care team members and the patient's provider was performed as part of comprehensive patient evaluation and provision of care management services.    [name], MSW, [email or web address] Phone [phone] 2:20 PM

## 2021-08-24 NOTE — ED Notes (Signed)
Patient taken to lobby by Luetta Nutting, NT to wait on ride from [employer]. First nurse aware.

## 2021-08-24 NOTE — Telephone Encounter (Signed)
Copied from [place] [phone]. Topic: General - Other >> Aug 24, 2021  4:26 PM Tessa Lerner A wrote: Reason for CRM: The patient would like to be contacted by their social worker when possible  The patient is currently experiencing difficulty with their current landlord and would like to discuss further when possible   Please contact when available

## 2021-08-24 NOTE — ED Notes (Signed)
Patient resting comfortably in stretcher awaiting ride. NAD noted. No needs expressed to RN.

## 2021-08-24 NOTE — ED Provider Notes (Signed)
[facility] Provider Note    Event Date/Time   First MD Initiated Contact with Patient 08/23/21 2340     (approximate)   History   Chest Pain   HPI  Evan [name] is a 66 y.o. male   facility presents to the ER for evaluation of palpitations and chest pain patient does have history of A. fib as well as CHF and CVA in the past.  States been compliant with his medications.  Denies any fevers or chills no abdominal pain or dysuria states he is under increased stress past few days due to getting Imodium and symptoms landlord.  He denies any nausea or vomiting.  No numbness or tingling at this time.      Physical Exam   Triage Vital Signs: ED Triage Vitals [08/23/21 2151]  Enc Vitals Group     BP (!) 155/96     Pulse Rate 87     Resp 18     Temp 97.6 F (36.4 C)     Temp Source Oral     SpO2 98 %     Weight      Height      Head Circumference      Peak Flow      Pain Score 7     Pain Loc      Pain Edu?      Excl. in [place]?     Most recent vital signs: Vitals:   08/23/21 2330 08/24/21 0000  BP: 139/79 135/72  Pulse: 69 68  Resp: (!) 22 19  Temp:    SpO2: 98% 98%     Constitutional: Alert  Eyes: Conjunctivae are normal.  Head: Atraumatic. Nose: No congestion/rhinnorhea. Mouth/Throat: Mucous membranes are moist.   Neck: Painless ROM.  Cardiovascular:   Good peripheral circulation. Irregularly irregular rhythm Respiratory: Normal respiratory effort.  No retractions.  Gastrointestinal: Soft and nontender.  Musculoskeletal:  no deformity Neurologic:  MAE spontaneously. No gross focal neurologic deficits are appreciated.  Skin:  Skin is warm, dry and intact. No rash noted. Psychiatric: Mood and affect are normal. Speech and behavior are normal.    ED Results / Procedures / Treatments   Labs (all labs ordered are listed, but only abnormal results are displayed) Labs Reviewed  BASIC METABOLIC PANEL - Abnormal; Notable for the  following components:      Result Value   Glucose, Bld 113 (*)    BUN 27 (*)    All other components within normal limits  CBC - Abnormal; Notable for the following components:   WBC 11.4 (*)    All other components within normal limits  TROPONIN I (HIGH SENSITIVITY) - Abnormal; Notable for the following components:   Troponin I (High Sensitivity) 20 (*)    All other components within normal limits  TROPONIN I (HIGH SENSITIVITY)     EKG  ED ECG REPORT I, Merlyn Lot, the attending physician, personally viewed and interpreted this ECG.   Date: 08/24/2021  EKG Time: 21:51  Rate: 75  Rhythm: afib  Axis: normal  Intervals:normal qt  ST&T Change: no stemi, nonspecific st abn,    RADIOLOGY Please see ED Course for my review and interpretation.  I personally reviewed all radiographic images ordered to evaluate for the above acute complaints and reviewed radiology reports and findings.  These findings were personally discussed with the patient.  Please see medical record for radiology report.    PROCEDURES:  Critical Care performed: No  Procedures  MEDICATIONS ORDERED IN ED: Medications - No data to display   IMPRESSION / MDM / [place] / ED COURSE  I reviewed the triage vital signs and the nursing notes.                              Differential diagnosis includes, but is not limited to, ACS, pericarditis, esophagitis, boerhaaves, pe, dissection, pna, bronchitis, costochondritis   Patient presenting dialysis presents described.  Patient will be.  Initial troponin mildly elevated but roughly at his baseline EKG is nonischemic.  Chest x-ray on my review does not show any evidence of pneumothorax.  Patient will be observed.  Patient signed out to oncoming physician pending repeat troponin.  If negative anticipate patient able to discharge home       FINAL CLINICAL IMPRESSION(S) / ED DIAGNOSES   Final diagnoses:  Atypical chest pain     Rx /  DC Orders   ED Discharge Orders     None        Note:  This document was prepared using Dragon voice recognition software and may include unintentional dictation errors.    Merlyn Lot, MD 08/24/21 0005

## 2021-08-25 NOTE — Telephone Encounter (Signed)
Pt states he is ready to go to an assisted living facility. He states he knows he is deteriorating, he states he keeps passing out and he feels that something bad is going on with his brain.He said he is open to assisted living now.

## 2021-08-26 ENCOUNTER — Ambulatory Visit: Payer: Self-pay | Admitting: *Deleted

## 2021-08-26 ENCOUNTER — Telehealth: Payer: Self-pay | Admitting: Nurse Practitioner

## 2021-08-26 DIAGNOSIS — I25118 Atherosclerotic heart disease of native coronary artery with other forms of angina pectoris: Secondary | ICD-10-CM

## 2021-08-26 DIAGNOSIS — F200 Paranoid schizophrenia: Secondary | ICD-10-CM

## 2021-08-26 DIAGNOSIS — J449 Chronic obstructive pulmonary disease, unspecified: Secondary | ICD-10-CM

## 2021-08-26 DIAGNOSIS — E1159 Type 2 diabetes mellitus with other circulatory complications: Secondary | ICD-10-CM

## 2021-08-26 NOTE — Telephone Encounter (Signed)
Spoke with Mr. Current regarding eviction concerns. Mr. Haug advised that he feels he would be better off living at an assisted living facility.  He states he was speaking with someone from [facility] [place] and that the phone number on his caller ID was [phone] but the phone went dead, and he is very frustrated that the phone keeps hanging up.  Mr. Menzer also explained that he was told that if he goes to live at a facility, he will have his check taken away and only receive $60 per month. He also stated that he feels living at a facility would be the safest option, so he won't continue to pass out and have to call 911.  Listened to Mr. Picone and explained that he CCM team will continue to work with him to get placed into a facility.and that  we would help him in any way that we can. Patient acknowledged understanding.

## 2021-08-26 NOTE — Telephone Encounter (Signed)
Copied from [place] [phone]. Topic: General - Other >> Aug 26, 2021 12:34 PM Leward Quan A wrote: Reason for CRM: Patient called in as soon as I answered the phone he stated that he got an email that stated that he is transgender and he was very upset about that I was able to talk him down from that. Then he wanted to talk about getting an eviction notice and that they want him out in 3 days. He also started talking about not having any food and no one to take him to go get food. He later wanted to go over all his appointments and how he will get there. We went through the list of appointments then he stated that he was given a blank paper that states he have 3 days to evict his apartment. And that the [facility] did not like the look of the paper also spoke about the night at the hospital and that his lawyers say that his apartment people are trying to scam him and his bank will not be sending them any checks again. Per patient he has no soap no clean clothes or money to do anything say that he is sitting in his apartment in front of the door waiting and watching the door. I was finally able to let him go he would like for [facility] nurse to give him a call back please at Ph# [phone]

## 2021-08-26 NOTE — Chronic Care Management (AMB) (Signed)
Chronic Care Management    Clinical Social Work Note  08/26/2021 Name: ODILON CASS MRN: [id number] DOB: 06/14/1956  [name] Linares is a 66 y.o. year old male who is a primary care patient of [name], [name], NP. The CCM team was consulted to assist the patient with chronic disease management and/or care coordination needs related to: Level of Care Concerns.   Engaged with patient by telephone for follow up visit in response to provider referral for social work chronic care management and care coordination services.   Consent to Services:  The patient was given information about Chronic Care Management services, agreed to services, and gave verbal consent prior to initiation of services.  Please see initial visit note for detailed documentation.   Patient agreed to services and consent obtained.   Assessment: Review of patient past medical history, allergies, medications, and health status, including review of relevant consultants reports was performed today as part of a comprehensive evaluation and provision of chronic care management and care coordination services.     SDOH (Social Determinants of Health) assessments and interventions performed:    Advanced Directives Status: Not addressed in this encounter.  CCM Care Plan  Allergies  Allergen Reactions   Penicillin G Hives   Sulfa Antibiotics Hives   Tiotropium    Zoloft [Sertraline Hcl] Other (See Comments)    Outpatient Encounter Medications as of 08/26/2021  Medication Sig   acetaminophen (TYLENOL) 500 MG tablet Take 500 mg by mouth every 6 (six) hours as needed.   albuterol (VENTOLIN HFA) 108 (90 Base) MCG/ACT inhaler Inhale 2 puffs into the lungs every 4 (four) hours as needed for wheezing or shortness of breath.   aspirin 81 MG chewable tablet Chew 1 tablet (81 mg total) by mouth daily.   Blood Glucose Monitoring Suppl (ONETOUCH VERIO) w/Device KIT Use to check blood sugar 3 to 4 times a day and document.  Please  bring to visits for review.   Cholecalciferol 125 MCG (5000 UT) TABS Take 1 tablet (5,000 Units total) by mouth daily.   Elastic Bandages & Supports (MEDICAL COMPRESSION STOCKINGS) MISC 1 Package by Does not apply route daily.   empagliflozin (JARDIANCE) 10 MG TABS tablet Take 1 tablet (10 mg total) by mouth daily.   ezetimibe (ZETIA) 10 MG tablet Take 1 tablet (10 mg total) by mouth daily.   furosemide (LASIX) 40 MG tablet TAKE 2 TABLETS BY MOUTH ONCE EVERY MORNING AND 1 TABLET ONCE EVERY EVENING   glucose blood (ONETOUCH VERIO) test strip Use to check blood sugar 3 to 4 times a day.   metoprolol succinate (TOPROL XL) 50 MG 24 hr tablet Take 1 tablet (50 mg total) by mouth daily.   nitroGLYCERIN (NITROSTAT) 0.4 MG SL tablet Place 1 tablet (0.4 mg total) under the tongue every 5 (five) minutes as needed for chest pain.   nystatin (MYCOSTATIN/NYSTOP) powder Apply 1 application topically 3 (three) times daily.   olmesartan (BENICAR) 5 MG tablet Take 2 tablets (10 mg total) by mouth daily.   Omega-3 Fatty Acids (OMEGA 3 500 PO) Take 500 mg by mouth daily.   OneTouch Delica Lancets 03J MISC Use to check blood sugar 2-3 times a day.   pantoprazole (PROTONIX) 40 MG tablet Take 1 tablet (40 mg total) by mouth daily.   predniSONE (DELTASONE) 50 MG tablet Take 1 tablet (50 mg total) by mouth daily with breakfast.   risperiDONE (RISPERDAL) 0.5 MG tablet Take 1 tablet (0.5 mg total) by mouth 2 (  two) times daily.   rivaroxaban (XARELTO) 20 MG TABS tablet Take 1 tablet (20 mg total) by mouth at bedtime.   rosuvastatin (CRESTOR) 40 MG tablet Take 1 tablet (40 mg total) by mouth daily.   Saccharomyces boulardii (PROBIOTIC) 250 MG CAPS Take 1 capsule by mouth in the morning and at bedtime.   sharps container 1 each by Does not apply route as needed.   Skin Protectants, Misc. (EUCERIN) cream Apply topically as needed for dry skin.   SYMBICORT 80-4.5 MCG/ACT inhaler INHALE 2 PUFFS BY MOUTH TWICE DAILY    triamcinolone cream (KENALOG) 0.1 % APPLY TO AFFECTED AREAS TWICE DAILY   vitamin B-12 (CYANOCOBALAMIN) 1000 MCG tablet Take 1 tablet (1,000 mcg total) by mouth daily.   No facility-administered encounter medications on file as of 08/26/2021.    Patient Active Problem List   Diagnosis Date Noted   Memory changes 08/12/2021   Other thrombophilia ([place]) 12/25/2020   Vitamin D deficiency 12/04/2020   Vitamin B12 deficiency 12/04/2020   Stenosis of right carotid artery 12/03/2020   History of CVA (cerebrovascular accident) 11/06/2020   History of 2019 novel coronavirus disease (COVID-19) 09/27/2020   Atherosclerosis of aorta ([place]) 12/04/2019   Persistent atrial fibrillation ([place]) 09/12/2019   CAD (coronary artery disease) 09/12/2019   Chronic venous stasis 09/12/2019   Hoarding disorder 09/12/2019   Cervical spinal stenosis 09/12/2019   Diabetic retinopathy ([place]) 09/12/2019   COPD, mild ([place]) 09/12/2019   Osteoporosis 09/09/2019   History of prostate cancer 09/09/2019   Paranoid type schizophrenia ([place]) 08/10/2019   Obstructive sleep apnea on CPAP 08/10/2019   Hyperlipidemia associated with type 2 diabetes mellitus (HCC) 08/10/2019   Chronic diastolic CHF (congestive heart failure) ([place]) 01/09/2014   Esophageal dysmotility 09/12/2013   Type 2 diabetes mellitus with morbid obesity ([place]) 02/14/2012   Obesity, morbid (more than 100 lbs over ideal weight or BMI > 40) ([place]) 07/26/2011   OLD MYOCARDIAL INFARCTION 03/02/2010   Hypertension associated with diabetes ([place]) 03/20/2009   CORONARY ATHEROSCLEROSIS, ARTERY BYPASS GRAFT 03/20/2009    Conditions to be addressed/monitored:   CAD, DMII, and Anxiety; Transportation and Mental Health Concerns    Care Plan : General Social Work (Adult)  Updates made by Vern Claude, LCSW since 08/26/2021 12:00 AM     Problem: Response to Treatment (Depression)      Goal: Response to Treatment Maximized   Start Date: 12/27/2020  Recent  Progress: On track  Priority: High  Note:   Current barriers:   Acute Mental Health needs related to Anxiety Housing barriers, Level of care concerns, and Mental Health Concerns  Needs Support, Education, and Care Coordination in order to meet unmet mental health needs. Clinical Goal(s): Over the next 120 days, patient will work with SW to reduce or manage symptoms of anxiety and increase knowledge and/or ability of: coping skills, healthy habits, self-management skills, and stress reduction.until connected for ongoing counseling.  Clinical Interventions:  Assessed patient's previous treatment, needs, coping skills, current treatment, support system and barriers to care Patient interviewed and appropriate assessments performed Provided mental health counseling with regard to managing mental health conditions Patient reports difficulty managing symptoms of anxiety triggered by chronic health conditions Concerns that rent isn't paid for. Concerns for not passing inspection and it needed to be completed 06/23/21 [name] is his leasing agent who contacted patient while CCM LCSW was on phone and shared that she will work with patient on meeting inspection. Scheduled to visit home on Tuesday, 06/28/21  11/28: Patient reports [name] has assisted him with passing the inspection 01/11: CCM LCSW discussed benefits of obtaining a Guardian through DSS to assist with paying rent, utilities, etc. Patient denies need for any resources stating he has no concerns with paying rent Patient is upset about the way he is treated by multiple personal aids, stating that current aid is dishonest about why she does not show to work. CCM LCSW strongly encouraged patient to contact agency about his concerns to reach a resolution 09/08: [name] is back as his guide for 27 hrs a week [place] [phone] 09/26: Patient informed CCM LCSW that [name] will provide transportation for his upcoming PCP appt  scheduled 05/11/21. This information was verified by [name], pt's new aid that will be assisting with ADL's in the home on Mondays, Wednesdays, and Fridays 11/1: Patient reports that he is no longer receiving services from [facility]. He has contacted [name] and is awaiting paperwork regarding home care agencies 11/8: Patient reports inability to work with [name] and her home aids. He has contacted [place] and requested to work with a new agency. Patient reports he has met with a new aid and will have an assessment completed within a week 11/28: Patient is not interested in ALF or SNF. He prefers to continue residing independently. CCM LCSW will follow up with pt regarding process of obtaining new aid through [facility] 11/30: Patient was strongly encouraged to re-consider ALF to provide improved socialization and support 12/22: CCM LCSW addressed reported concerns regarding bank fraud and alleged risks of eviction due to payment confusion. Patient is adamant that he is not interested in leaving current residence. States he feels safe in the home, despite reports of others having access to his home without his permission 01/11: Patient is satisfied with services provided by [facility]. LCSW informed him of his next scheduled home visit Patient receives visits from neighbors which promotes mood CCM LCSW discussed CBT strategies to assist with managing symptoms  Patient reports recent attempt for bank fraud. The bank restored his account and are sending new cards. Patient identified strategies to prevent future scams, including, locking his belongings in a safe at home, feels previous aids were stealing his info 12/22: CCM LCSW discussed with patient the importance of contacting Law Enforcement if he has concerns for safety/fraud Validation and encouragement was provided during call. CCM LCSW encouraged patient to continue reflecting on past experiences and current friendships to promote positive mood and stress  management. States he has enjoyed playing cards online with friends and praying to cope with stressors. 10/10 Patient shared that recent stress has been alleviated with drawing and talking with family and friends  CCM LCSW provided a supportive environment to allow patient to process his emotions Patient reports compliance with medications. Patient utilizes medicine cabinet with labels, AM and PM, to assist with medication management Patient reports that his strong spiritual relationship keeps him encouraged to move forward instead of focusing on past events 11/1: Patient enjoys walking to assist with stress management Patient reports becoming stir crazy from staying at home for majority of the day. CCM LCSW discussed benefits of participating in [facility]. Patient reports hx of attending [facility]. States that he disenrolled because staff and participants believed he was delusional because he has close relationships with various celebrities. Patient is not interested in initiating psychiatry and/or counseling to assist with strengthening support system 09/08: Patient reports that he participates in a group weekly to promote socialization with others 12/22: States  he feels "liberated" without caregivers because he no longer has things stolen from him CCM LCSW discussed strategies to improve communication skills with aid to assist in preventing misunderstandings or offending others  CCM LCSW reviewed upcoming appointments with patient. He reports that he is utilizing [employer] for transportation. His aid has offered to bring patient to local stores to obtain cleaning supplies. Patient reports that he is aware that he can also utilize [employer] for transportation to medical appointments 10/10: Patient has a ride scheduled for 10/18 to take him grocery shopping 01/11: CCM LCSW completed a referral to Care Guide to assist patient with scheduling transportation to upcoming appt on 09/08/21 CCM LCSW discussed  strategies to assist patient with memory concerns. Patient agreed to write notes and post them in high traffic areas of the home to remind him of upcoming appts and/or medications CCM LCSW collaborated with PCP and CCM RN regarding concerns of medication adherence. LCSW informed RNCM of patient's request for a f/up call regarding questions about his cholesterol levels and strategies to manage condition Other interventions: Solution-Focused Strategies, Mindfulness or Relaxation Training, Active listening / Reflection utilized , Emotional Supportive Provided, Psychoeducation for mental health needs , Motivational Interviewing, Participation in counseling encouraged , Participation in support group encouraged , Consideration of in-home help encouraged , and Verbalization of feelings encouraged   Discussed plans with patient for ongoing care management follow up and provided patient with direct contact information for care management team Collaboration with PCP regarding development and update of comprehensive plan of care as evidenced by provider attestation and co-signature Inter-disciplinary care team collaboration (see longitudinal plan of care) 08/26/21 Patient contacted to discuss options for Assisted Living. Patient confirms continued  desire to move where he can get help with his care needs. Patient states that he is being evicted and has "5"days to move out. However patient states that his landlord will "work:" with him Placement process discussed and the fact that the majority of his income will be going to pay for his stay at the facility "I have to pay rent somewhere" Collaboration phone call to [facility]-patient's local management entity [phone] to discuss assistance with finding placement for patient, patient provided them with verbal consent to assist with his placement needs. This Education officer, museum spoke with Mardene Celeste who place a referral to the Housing Team- they will contact this Research officer, political party for follow up Patient Goals/Self-Care Activities: Over the next 120 days Attend all scheduled appointments with providers Contact office with any questions or concerns Utilize [employer] transportation number to assist with attending medical appointments Practice relaxation or meditation daily Practice positive thinking and self-talk       Follow Up Plan: SW will follow up with patient by phone over the next 14 business days      [employer], [place] [phone]

## 2021-08-26 NOTE — Patient Instructions (Signed)
Visit Information  Thank you for taking time to visit with me today. Please don't hesitate to contact me if I can be of assistance to you before our next scheduled telephone appointment.  Following are the goals we discussed today:  Attend all scheduled appointments with providers Contact office with any questions or concerns Utilize [facility] transportation number to assist with attending medical appointments Practice relaxation or meditation daily Practice positive thinking and self-talk  Our next appointment is by telephone on 09/07/21 at 10:45am  Please call the care guide team at [phone] if you need to cancel or reschedule your appointment.   If you are experiencing a Mental Health or [place] or need someone to talk to, please call the Suicide and Crisis Lifeline: 988   Patient verbalizes understanding of instructions and care plan provided today and agrees to view in [place]. Active MyChart status confirmed with patient.    Telephone follow up appointment with care management team member scheduled for: 09/07/21  Elliot Gurney, [place] [phone]

## 2021-08-26 NOTE — Telephone Encounter (Signed)
Patient is working with CCM team and staff in office -- we are working on possible assisted living placement which patient was interested in yesterday.

## 2021-08-29 ENCOUNTER — Other Ambulatory Visit: Payer: Self-pay

## 2021-08-29 ENCOUNTER — Emergency Department
Admission: EM | Admit: 2021-08-29 | Discharge: 2021-08-29 | Disposition: A | Discharge status: Home / Self Care | Payer: Medicare Other | Attending: Emergency Medicine | Admitting: Emergency Medicine

## 2021-08-29 DIAGNOSIS — I110 Hypertensive heart disease with heart failure: Secondary | ICD-10-CM | POA: Insufficient documentation

## 2021-08-29 DIAGNOSIS — Z8546 Personal history of malignant neoplasm of prostate: Secondary | ICD-10-CM | POA: Diagnosis not present

## 2021-08-29 DIAGNOSIS — Z5900 Homelessness unspecified: Secondary | ICD-10-CM | POA: Diagnosis not present

## 2021-08-29 DIAGNOSIS — Z7951 Long term (current) use of inhaled steroids: Secondary | ICD-10-CM | POA: Insufficient documentation

## 2021-08-29 DIAGNOSIS — J45909 Unspecified asthma, uncomplicated: Secondary | ICD-10-CM | POA: Insufficient documentation

## 2021-08-29 DIAGNOSIS — R42 Dizziness and giddiness: Secondary | ICD-10-CM | POA: Diagnosis not present

## 2021-08-29 DIAGNOSIS — Z8673 Personal history of transient ischemic attack (TIA), and cerebral infarction without residual deficits: Secondary | ICD-10-CM | POA: Insufficient documentation

## 2021-08-29 DIAGNOSIS — I5032 Chronic diastolic (congestive) heart failure: Secondary | ICD-10-CM | POA: Diagnosis not present

## 2021-08-29 DIAGNOSIS — I2510 Atherosclerotic heart disease of native coronary artery without angina pectoris: Secondary | ICD-10-CM | POA: Diagnosis not present

## 2021-08-29 DIAGNOSIS — Z7901 Long term (current) use of anticoagulants: Secondary | ICD-10-CM | POA: Insufficient documentation

## 2021-08-29 DIAGNOSIS — Z79899 Other long term (current) drug therapy: Secondary | ICD-10-CM | POA: Diagnosis not present

## 2021-08-29 DIAGNOSIS — L259 Unspecified contact dermatitis, unspecified cause: Secondary | ICD-10-CM | POA: Diagnosis not present

## 2021-08-29 DIAGNOSIS — J449 Chronic obstructive pulmonary disease, unspecified: Secondary | ICD-10-CM | POA: Insufficient documentation

## 2021-08-29 DIAGNOSIS — Z7982 Long term (current) use of aspirin: Secondary | ICD-10-CM | POA: Diagnosis not present

## 2021-08-29 DIAGNOSIS — R404 Transient alteration of awareness: Secondary | ICD-10-CM | POA: Diagnosis not present

## 2021-08-29 DIAGNOSIS — R6889 Other general symptoms and signs: Secondary | ICD-10-CM | POA: Diagnosis not present

## 2021-08-29 DIAGNOSIS — Z8616 Personal history of COVID-19: Secondary | ICD-10-CM | POA: Diagnosis not present

## 2021-08-29 DIAGNOSIS — Z951 Presence of aortocoronary bypass graft: Secondary | ICD-10-CM | POA: Diagnosis not present

## 2021-08-29 DIAGNOSIS — E11319 Type 2 diabetes mellitus with unspecified diabetic retinopathy without macular edema: Secondary | ICD-10-CM | POA: Insufficient documentation

## 2021-08-29 DIAGNOSIS — Z743 Need for continuous supervision: Secondary | ICD-10-CM | POA: Diagnosis not present

## 2021-08-29 DIAGNOSIS — I4891 Unspecified atrial fibrillation: Secondary | ICD-10-CM | POA: Diagnosis not present

## 2021-08-29 LAB — URINALYSIS, ROUTINE W REFLEX MICROSCOPIC
Bilirubin Urine: NEGATIVE
Glucose, UA: NEGATIVE mg/dL
Hgb urine dipstick: NEGATIVE
Ketones, ur: NEGATIVE mg/dL
Leukocytes,Ua: NEGATIVE
Nitrite: NEGATIVE
Protein, ur: NEGATIVE mg/dL
Specific Gravity, Urine: 1.013 (ref 1.005–1.030)
pH: 5 (ref 5.0–8.0)

## 2021-08-29 LAB — BASIC METABOLIC PANEL
Anion gap: 7 (ref 5–15)
BUN: 25 mg/dL — ABNORMAL HIGH (ref 8–23)
CO2: 23 mmol/L (ref 22–32)
Calcium: 9.2 mg/dL (ref 8.9–10.3)
Chloride: 108 mmol/L (ref 98–111)
Creatinine, Ser: 0.9 mg/dL (ref 0.61–1.24)
GFR, Estimated: >60 mL/min (ref >60)
Glucose, Bld: 138 mg/dL — ABNORMAL HIGH (ref 70–99)
Potassium: 3.6 mmol/L (ref 3.5–5.1)
Sodium: 138 mmol/L (ref 135–145)

## 2021-08-29 LAB — TROPONIN I (HIGH SENSITIVITY)
Troponin I (High Sensitivity): 25 ng/L — ABNORMAL HIGH (ref <18)
Troponin I (High Sensitivity): 26 ng/L — ABNORMAL HIGH (ref <18)

## 2021-08-29 LAB — CBC
HCT: 43.3 % (ref 39.0–52.0)
Hemoglobin: 13.8 g/dL (ref 13.0–17.0)
MCH: 28.9 pg (ref 26.0–34.0)
MCHC: 31.9 g/dL (ref 30.0–36.0)
MCV: 90.6 fL (ref 80.0–100.0)
Platelets: 222 10*3/uL (ref 150–400)
RBC: 4.78 MIL/uL (ref 4.22–5.81)
RDW: 14.9 % (ref 11.5–15.5)
WBC: 10.4 10*3/uL (ref 4.0–10.5)
nRBC: 0 % (ref 0.0–0.2)

## 2021-08-29 LAB — CBG MONITORING, ED: Glucose-Capillary: 128 mg/dL — ABNORMAL HIGH (ref 70–99)

## 2021-08-29 MED ORDER — DIPHENHYDRAMINE HCL 25 MG PO CAPS
50.0000 mg | ORAL_CAPSULE | Freq: Once | ORAL | Status: AC
Start: 2021-08-29 — End: 2021-08-29
  Administered 2021-08-29: 50 mg via ORAL
  Filled 2021-08-29: qty 2

## 2021-08-29 MED ORDER — METOPROLOL SUCCINATE ER 50 MG PO TB24
50.0000 mg | ORAL_TABLET | Freq: Every day | ORAL | Status: DC
  Administered 2021-08-29: 50 mg via ORAL
  Filled 2021-08-29: qty 1

## 2021-08-29 MED ORDER — ROSUVASTATIN CALCIUM 20 MG PO TABS: 40.0000 mg | ORAL_TABLET | Freq: Every day | ORAL | Status: DC

## 2021-08-29 MED ORDER — ALBUTEROL SULFATE (2.5 MG/3ML) 0.083% IN NEBU: 2.5000 mg | INHALATION_SOLUTION | RESPIRATORY_TRACT | Status: DC | PRN

## 2021-08-29 MED ORDER — EMPAGLIFLOZIN 10 MG PO TABS: 10.0000 mg | ORAL_TABLET | Freq: Every day | ORAL | Status: DC

## 2021-08-29 MED ORDER — PANTOPRAZOLE SODIUM 40 MG PO TBEC
40.0000 mg | DELAYED_RELEASE_TABLET | Freq: Every day | ORAL | Status: DC
  Administered 2021-08-29: 40 mg via ORAL
  Filled 2021-08-29: qty 1

## 2021-08-29 MED ORDER — MOMETASONE FURO-FORMOTEROL FUM 100-5 MCG/ACT IN AERO: 2.0000 | INHALATION_SPRAY | Freq: Two times a day (BID) | RESPIRATORY_TRACT | Status: DC

## 2021-08-29 MED ORDER — VITAMIN B-12 1000 MCG PO TABS
1000.0000 ug | ORAL_TABLET | Freq: Every day | ORAL | Status: DC
Start: 2021-08-29 — End: 2021-08-29

## 2021-08-29 MED ORDER — EZETIMIBE 10 MG PO TABS: 10.0000 mg | ORAL_TABLET | Freq: Every day | ORAL | Status: DC

## 2021-08-29 MED ORDER — ASPIRIN 81 MG PO CHEW
81.0000 mg | CHEWABLE_TABLET | Freq: Every day | ORAL | Status: DC
  Administered 2021-08-29: 81 mg via ORAL
  Filled 2021-08-29: qty 1

## 2021-08-29 MED ORDER — FUROSEMIDE 40 MG PO TABS
40.0000 mg | ORAL_TABLET | Freq: Two times a day (BID) | ORAL | Status: DC
  Administered 2021-08-29: 40 mg via ORAL
  Filled 2021-08-29: qty 1

## 2021-08-29 MED ORDER — RIVAROXABAN 20 MG PO TABS: 20.0000 mg | ORAL_TABLET | Freq: Every day | ORAL | Status: DC

## 2021-08-29 MED ORDER — RISPERIDONE 1 MG PO TABS
0.5000 mg | ORAL_TABLET | Freq: Two times a day (BID) | ORAL | Status: DC
  Administered 2021-08-29: 0.5 mg via ORAL
  Filled 2021-08-29: qty 1

## 2021-08-29 MED ORDER — VITAMIN D 25 MCG (1000 UNIT) PO TABS
5000.0000 [IU] | ORAL_TABLET | Freq: Every day | ORAL | Status: DC
  Administered 2021-08-29: 5000 [IU] via ORAL
  Filled 2021-08-29: qty 5

## 2021-08-29 MED ORDER — IRBESARTAN 75 MG PO TABS
37.5000 mg | ORAL_TABLET | Freq: Every day | ORAL | Status: DC
Start: 2021-08-29 — End: 2021-08-29

## 2021-08-29 NOTE — ED Notes (Signed)
TOC at BS 

## 2021-08-29 NOTE — ED Notes (Signed)
Up to b/r to void

## 2021-08-29 NOTE — ED Provider Notes (Signed)
[facility] Provider Note    Event Date/Time   First MD Initiated Contact with Patient 08/29/21 0301     (approximate)   History   Dizziness   HPI  Evan [name] is a 66 y.o. male brought to the ED via EMS with a chief complaint of: 1.  Vertigo which has now resolved 2.  Rash on both forearms 3.  Homelessness  Patient with a history of CAD, CHF, COPD, diabetes, atrial fibrillation on Xarelto, schizophrenia who reports history of vertigo which began earlier today but has resolved by the time of his arrival to the emergency department.  Patient also notes redness and irritation to both forearms which are itchy.  Denies new medications, foods or over-the-counter products.  Patient was told today by his landlord that he was evicted and is currently homeless.  Denies fever, cough, chest pain, shortness of breath, abdominal pain, nausea or vomiting.     Past Medical History   Past Medical History:  Diagnosis Date   Anginal pain ([place])    Anxiety disorder    Asthma    Atresia of esophagus without fistula    CAD (coronary artery disease)    Cellulitis    CHF (congestive heart failure) (HCC)    NYHA CLASS III,CHRONIC,DIASTOLIC   COPD (chronic obstructive pulmonary disease) ([place])    COVID-19    Diabetes mellitus without complication (HCC)    Edema    RIGHT LOWER LEG   Gastroesophageal reflux    H/O: GI bleed    History of pneumonia    Remote   History of scarlet fever    Childhood   Hyperlipidemia    Hypertension    Myocardial infarction ([place]) 2009   Obesity    Obstructive sleep apnea    Pain    CHRONIC BACK / ABDOMINAL   Panic disorder    Peripheral venous insufficiency    PTSD (post-traumatic stress disorder)    Retinopathy    DIABETIC   Stasis, venous    Stroke [facility])    Vertigo      Active Problem List   Patient Active Problem List   Diagnosis Date Noted   Memory changes 08/12/2021   Other thrombophilia ([place]) 12/25/2020    Vitamin D deficiency 12/04/2020   Vitamin B12 deficiency 12/04/2020   Stenosis of right carotid artery 12/03/2020   History of CVA (cerebrovascular accident) 11/06/2020   History of 2019 novel coronavirus disease (COVID-19) 09/27/2020   Atherosclerosis of aorta ([place]) 12/04/2019   Persistent atrial fibrillation ([place]) 09/12/2019   CAD (coronary artery disease) 09/12/2019   Chronic venous stasis 09/12/2019   Hoarding disorder 09/12/2019   Cervical spinal stenosis 09/12/2019   Diabetic retinopathy ([place]) 09/12/2019   COPD, mild ([place]) 09/12/2019   Osteoporosis 09/09/2019   History of prostate cancer 09/09/2019   Paranoid type schizophrenia ([place]) 08/10/2019   Obstructive sleep apnea on CPAP 08/10/2019   Hyperlipidemia associated with type 2 diabetes mellitus ([place]) 08/10/2019   Chronic diastolic CHF (congestive heart failure) ([place]) 01/09/2014   Esophageal dysmotility 09/12/2013   Type 2 diabetes mellitus with morbid obesity ([place]) 02/14/2012   Obesity, morbid (more than 100 lbs over ideal weight or BMI > 40) ([place]) 07/26/2011   OLD MYOCARDIAL INFARCTION 03/02/2010   Hypertension associated with diabetes ([place]) 03/20/2009   CORONARY ATHEROSCLEROSIS, ARTERY BYPASS GRAFT 03/20/2009     Past Surgical History   Past Surgical History:  Procedure Laterality Date   CARDIAC CATHETERIZATION     CATARACT  EXTRACTION Left    CATARACT EXTRACTION W/PHACO Right 05/03/2017   Procedure: CATARACT EXTRACTION PHACO AND INTRAOCULAR LENS PLACEMENT ([place]);  Surgeon: Leandrew Koyanagi, MD;  Location: [facility] ORS;  Service: Ophthalmology;  Laterality: Right;  [place] 00:35.3 AP% 12.3 CDE 4.33 Fluid Pack lot # [id number]        COLONOSCOPY WITH PROPOFOL N/A 11/26/2020   Procedure: COLONOSCOPY WITH PROPOFOL;  Surgeon: Jonathon Bellows, MD;  Location: [facility] ENDOSCOPY;  Service: Gastroenterology;  Laterality: N/A;  ANNETTE TO PICK UP [phone]   CORONARY ANGIOPLASTY WITH STENT PLACEMENT  2002   CORONARY ANGIOPLASTY WITH  STENT PLACEMENT  1999   CORONARY ARTERY BYPASS GRAFT     x7   ESOPHAGOGASTRODUODENOSCOPY N/A 09/19/2016   Procedure: ESOPHAGOGASTRODUODENOSCOPY (EGD);  Surgeon: Lollie Sails, MD;  Location: [facility] ENDOSCOPY;  Service: Endoscopy;  Laterality: N/A;   ESOPHAGOGASTRODUODENOSCOPY (EGD) WITH PROPOFOL  11/26/2020   Procedure: ESOPHAGOGASTRODUODENOSCOPY (EGD) WITH PROPOFOL;  Surgeon: Jonathon Bellows, MD;  Location: [facility] ENDOSCOPY;  Service: Gastroenterology;;     Home Medications   Prior to Admission medications   Medication Sig Start Date End Date Taking? Authorizing Provider  acetaminophen (TYLENOL) 500 MG tablet Take 500 mg by mouth every 6 (six) hours as needed.    [provider]  albuterol (VENTOLIN HFA) 108 (90 Base) MCG/ACT inhaler Inhale 2 puffs into the lungs every 4 (four) hours as needed for wheezing or shortness of breath. 03/03/21   Ward, Delice Bison, DO  aspirin 81 MG chewable tablet Chew 1 tablet (81 mg total) by mouth daily. 11/23/20   [name], Henrine Screws T, NP  Blood Glucose Monitoring Suppl (ONETOUCH VERIO) w/Device KIT Use to check blood sugar 3 to 4 times a day and document.  Please bring to visits for review. 07/08/20   [name] T, NP  Cholecalciferol 125 MCG (5000 UT) TABS Take 1 tablet (5,000 Units total) by mouth daily. 08/13/21   [name], NP  Elastic Bandages & Supports (MEDICAL COMPRESSION STOCKINGS) MISC 1 Package by Does not apply route daily. 08/28/20   Sable Feil, PA-C  empagliflozin (JARDIANCE) 10 MG TABS tablet Take 1 tablet (10 mg total) by mouth daily. 05/11/21   [name], Henrine Screws T, NP  ezetimibe (ZETIA) 10 MG tablet Take 1 tablet (10 mg total) by mouth daily. 05/11/21   [name], Henrine Screws T, NP  furosemide (LASIX) 40 MG tablet TAKE 2 TABLETS BY MOUTH ONCE EVERY MORNING AND 1 TABLET ONCE EVERY EVENING 05/11/21   [name], [name] T, NP  glucose blood (ONETOUCH VERIO) test strip Use to check blood sugar 3 to 4 times a day. 07/08/20   [name], Henrine Screws T, NP   metoprolol succinate (TOPROL XL) 50 MG 24 hr tablet Take 1 tablet (50 mg total) by mouth daily. 05/11/21   [name], Henrine Screws T, NP  nitroGLYCERIN (NITROSTAT) 0.4 MG SL tablet Place 1 tablet (0.4 mg total) under the tongue every 5 (five) minutes as needed for chest pain. 05/13/21   [name], Henrine Screws T, NP  nystatin (MYCOSTATIN/NYSTOP) powder Apply 1 application topically 3 (three) times daily. 05/11/21   [name], Henrine Screws T, NP  olmesartan (BENICAR) 5 MG tablet Take 2 tablets (10 mg total) by mouth daily. 08/12/21   [name], Henrine Screws T, NP  Omega-3 Fatty Acids (OMEGA 3 500 PO) Take 500 mg by mouth daily.    [provider]  OneTouch Delica Lancets 47W MISC Use to check blood sugar 2-3 times a day. 09/29/20   [name], Henrine Screws T, NP  pantoprazole (PROTONIX) 40 MG tablet  Take 1 tablet (40 mg total) by mouth daily. 04/12/21   [name], Henrine Screws T, NP  predniSONE (DELTASONE) 50 MG tablet Take 1 tablet (50 mg total) by mouth daily with breakfast. 08/17/21   Cuthriell, Charline Bills, PA-C  risperiDONE (RISPERDAL) 0.5 MG tablet Take 1 tablet (0.5 mg total) by mouth 2 (two) times daily. 05/11/21   [name], Henrine Screws T, NP  rivaroxaban (XARELTO) 20 MG TABS tablet Take 1 tablet (20 mg total) by mouth at bedtime. 08/12/21   [name], Henrine Screws T, NP  rosuvastatin (CRESTOR) 40 MG tablet Take 1 tablet (40 mg total) by mouth daily. 08/13/21   [name] T, NP  Saccharomyces boulardii (PROBIOTIC) 250 MG CAPS Take 1 capsule by mouth in the morning and at bedtime. 05/31/21   Merlyn Lot, MD  sharps container 1 each by Does not apply route as needed. 09/29/20   [name], NP  Skin Protectants, Misc. (EUCERIN) cream Apply topically as needed for dry skin. 08/28/20   Sable Feil, PA-C  SYMBICORT 80-4.5 MCG/ACT inhaler INHALE 2 PUFFS BY MOUTH TWICE DAILY 05/10/21   [name], Henrine Screws T, NP  triamcinolone cream (KENALOG) 0.1 % APPLY TO AFFECTED AREAS TWICE DAILY 05/10/21   [name], Henrine Screws T, NP  vitamin B-12 (CYANOCOBALAMIN) 1000  MCG tablet Take 1 tablet (1,000 mcg total) by mouth daily. 08/13/21   [name] T, NP     Allergies  Penicillin g, Sulfa antibiotics, Tiotropium, and Zoloft [sertraline hcl]   Family History   Family History  Problem Relation Age of Onset   Heart attack Mother    Hypertension Mother    Hyperlipidemia Mother    Heart attack Brother 50       MI   Coronary artery disease Other      Physical Exam  Triage Vital Signs: ED Triage Vitals  Enc Vitals Group     BP 08/29/21 0211 (!) 172/74     Pulse Rate 08/29/21 0211 78     Resp 08/29/21 0211 18     Temp 08/29/21 0211 98.4 F (36.9 C)     Temp Source 08/29/21 0211 Oral     SpO2 08/29/21 0211 95 %     Weight 08/29/21 0210 200 lb (90.7 kg)     Height 08/29/21 0210 5' 5"  (1.651 m)     Head Circumference --      Peak Flow --      Pain Score 08/29/21 0210 7     Pain Loc --      Pain Edu? --      Excl. in [place]? --     Updated Vital Signs: BP (!) 126/92    Pulse 77    Temp 98.4 F (36.9 C) (Oral)    Resp 16    Ht 5' 5"  (1.651 m)    Wt 90.7 kg    SpO2 97%    BMI 33.28 kg/m    General: Awake, no distress.  Talkative. CV:  RRR.  Good peripheral perfusion.  Resp:  Normal effort.  CTAB. Abd:  Nontender to light or deep palpation.  No distention.  Other:  Alert and oriented x3.  CN II-XII grossly intact. MAEx4.  No carotid bruits.  Patchy pruritic areas of urticaria to bilateral forearms.   ED Results / Procedures / Treatments  Labs (all labs ordered are listed, but only abnormal results are displayed) Labs Reviewed  BASIC METABOLIC PANEL - Abnormal; Notable for the following components:      Result Value  Glucose, Bld 138 (*)    BUN 25 (*)    All other components within normal limits  URINALYSIS, ROUTINE W REFLEX MICROSCOPIC - Abnormal; Notable for the following components:   Color, Urine YELLOW (*)    APPearance CLEAR (*)    All other components within normal limits  TROPONIN I (HIGH SENSITIVITY) - Abnormal; Notable  for the following components:   Troponin I (High Sensitivity) 25 (*)    All other components within normal limits  TROPONIN I (HIGH SENSITIVITY) - Abnormal; Notable for the following components:   Troponin I (High Sensitivity) 26 (*)    All other components within normal limits  CBC  CBG MONITORING, ED  CBG MONITORING, ED  CBG MONITORING, ED  CBG MONITORING, ED     EKG  ED ECG REPORT I, Jobe Mutch J, the attending physician, personally viewed and interpreted this ECG.   Date: 08/29/2021  EKG Time: 0217  Rate: 81  Rhythm: atrial fibrillation, rate 81  Axis: None  Intervals:none  ST&T Change: Nonspecific    RADIOLOGY None   Official radiology report(s): No results found.   PROCEDURES:  Critical Care performed: No  Procedures   MEDICATIONS ORDERED IN ED: Medications  albuterol (VENTOLIN HFA) 108 (90 Base) MCG/ACT inhaler 2 puff (has no administration in time range)  aspirin chewable tablet 81 mg (has no administration in time range)  Cholecalciferol TABS 5,000 Units (has no administration in time range)  empagliflozin (JARDIANCE) tablet 10 mg (has no administration in time range)  ezetimibe (ZETIA) tablet 10 mg (has no administration in time range)  furosemide (LASIX) tablet 40 mg (has no administration in time range)  metoprolol succinate (TOPROL-XL) 24 hr tablet 50 mg (has no administration in time range)  irbesartan (AVAPRO) tablet 37.5 mg (has no administration in time range)  pantoprazole (PROTONIX) EC tablet 40 mg (has no administration in time range)  risperiDONE (RISPERDAL) tablet 0.5 mg (has no administration in time range)  rivaroxaban (XARELTO) tablet 20 mg (has no administration in time range)  rosuvastatin (CRESTOR) tablet 40 mg (has no administration in time range)  mometasone-formoterol (DULERA) 100-5 MCG/ACT inhaler 2 puff (has no administration in time range)  vitamin B-12 (CYANOCOBALAMIN) tablet 1,000 mcg (has no administration in time range)   diphenhydrAMINE (BENADRYL) capsule 50 mg (50 mg Oral Given 08/29/21 0357)     IMPRESSION / MDM / ASSESSMENT AND PLAN / ED COURSE  I reviewed the triage vital signs and the nursing notes.                             66 year old male presenting for vertigo, rash, homelessness.  Differential diagnosis includes but is not limited to CVA, TIA, environmental exposure, social barriers, infectious, metabolic etiologies, etc. I have personally reviewed patient's chart and see chronic care management notes, most recently 08/26/2021 and PCP office visit from 08/12/2021 for diabetes management  Vertigo is resolved.  Patient is neurologically intact without deficits.  Will administer Benadryl for contact dermatitis.  Laboratory results demonstrate normal WBC, normal electrolytes, normal UA, mildly elevated troponin which patient has had previously.  Will repeat time troponin.  If patient is medically cleared, he will have TOC consult for social work given his homelessness situation.  Clinical Course as of 08/29/21 0517  Mon Aug 29, 2021  0508 Troponin remained stable.  At this time patient will be a TOC border for social work consult for homelessness.  He is currently resting in  no acute distress. [JS]    Clinical Course User Index [JS] Paulette Blanch, MD     FINAL CLINICAL IMPRESSION(S) / ED DIAGNOSES   Final diagnoses:  Vertigo  Contact dermatitis, unspecified contact dermatitis type, unspecified trigger  Homelessness     Rx / DC Orders   ED Discharge Orders     None        Note:  This document was prepared using Dragon voice recognition software and may include unintentional dictation errors.   Paulette Blanch, MD 08/29/21 [phone]

## 2021-08-29 NOTE — ED Provider Notes (Signed)
----------------------------------------- °  11:42 AM on 08/29/2021 -----------------------------------------  The patient has been evaluated by social work.  The patient actually does have a home.  He has been served with eviction paperwork, but does have a place to stay at this time.  Social work has arranged for home health, and will arrange for a ride.  At this time, the patient is stable for discharge.  Return precautions provided.   Arta Silence, MD 08/29/21 1143

## 2021-08-29 NOTE — ED Notes (Signed)
Pt alert, sitting in chair, up to b/r with walker, back to stretcher, NAD, calm, interactive, agreeable.

## 2021-08-29 NOTE — ED Notes (Signed)
TOC continues to work with pt, coordinating ride.
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
## 2021-08-29 NOTE — ED Triage Notes (Signed)
Pt to ED via [place].  Pt states he has had vertigo that started earlier today. Pt states it has gotten better. Pt also c/o pain, redness and irritation to right arm.

## 2021-08-29 NOTE — ED Notes (Signed)
Pt called to triage, states he needs to put his stuff in a bag first and has to use the restroom before being triaged. Pt given bag per request and asked to get a urine specimen while in bathroom.
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
## 2021-08-31 ENCOUNTER — Telehealth: Payer: Self-pay | Admitting: Nurse Practitioner
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
## 2021-09-01 ENCOUNTER — Emergency Department: Payer: Medicare Other

## 2021-09-01 ENCOUNTER — Other Ambulatory Visit: Payer: Self-pay

## 2021-09-01 ENCOUNTER — Emergency Department
Admission: EM | Admit: 2021-09-01 | Discharge: 2021-09-02 | Disposition: A | Discharge status: Home / Self Care | Payer: Medicare Other | Attending: Student in an Organized Health Care Education/Training Program | Admitting: Student in an Organized Health Care Education/Training Program

## 2021-09-01 DIAGNOSIS — Z743 Need for continuous supervision: Secondary | ICD-10-CM | POA: Diagnosis not present

## 2021-09-01 DIAGNOSIS — J101 Influenza due to other identified influenza virus with other respiratory manifestations: Secondary | ICD-10-CM | POA: Diagnosis not present

## 2021-09-01 DIAGNOSIS — Z20822 Contact with and (suspected) exposure to covid-19: Secondary | ICD-10-CM | POA: Insufficient documentation

## 2021-09-01 DIAGNOSIS — Z79899 Other long term (current) drug therapy: Secondary | ICD-10-CM | POA: Insufficient documentation

## 2021-09-01 DIAGNOSIS — I4891 Unspecified atrial fibrillation: Secondary | ICD-10-CM | POA: Diagnosis not present

## 2021-09-01 DIAGNOSIS — R404 Transient alteration of awareness: Secondary | ICD-10-CM | POA: Diagnosis not present

## 2021-09-01 DIAGNOSIS — R42 Dizziness and giddiness: Secondary | ICD-10-CM | POA: Diagnosis present

## 2021-09-01 DIAGNOSIS — J45909 Unspecified asthma, uncomplicated: Secondary | ICD-10-CM | POA: Diagnosis not present

## 2021-09-01 DIAGNOSIS — R0602 Shortness of breath: Secondary | ICD-10-CM | POA: Diagnosis not present

## 2021-09-01 DIAGNOSIS — R059 Cough, unspecified: Secondary | ICD-10-CM | POA: Diagnosis not present

## 2021-09-01 DIAGNOSIS — R6889 Other general symptoms and signs: Secondary | ICD-10-CM | POA: Diagnosis not present

## 2021-09-01 DIAGNOSIS — R051 Acute cough: Secondary | ICD-10-CM | POA: Diagnosis present

## 2021-09-01 LAB — URINE DRUG SCREEN, QUALITATIVE (ARMC ONLY)
Amphetamines, Ur Screen: NOT DETECTED
Barbiturates, Ur Screen: NOT DETECTED
Benzodiazepine, Ur Scrn: NOT DETECTED
Cannabinoid 50 Ng, Ur ~~LOC~~: NOT DETECTED
Cocaine Metabolite,Ur ~~LOC~~: NOT DETECTED
MDMA (Ecstasy)Ur Screen: NOT DETECTED
Methadone Scn, Ur: NOT DETECTED
Opiate, Ur Screen: NOT DETECTED
Phencyclidine (PCP) Ur S: NOT DETECTED
Tricyclic, Ur Screen: NOT DETECTED

## 2021-09-01 LAB — COMPREHENSIVE METABOLIC PANEL
ALT: 24 U/L (ref 0–44)
AST: 21 U/L (ref 15–41)
Albumin: 4 g/dL (ref 3.5–5.0)
Alkaline Phosphatase: 74 U/L (ref 38–126)
Anion gap: 7 (ref 5–15)
BUN: 20 mg/dL (ref 8–23)
CO2: 25 mmol/L (ref 22–32)
Calcium: 8.9 mg/dL (ref 8.9–10.3)
Chloride: 106 mmol/L (ref 98–111)
Creatinine, Ser: 0.92 mg/dL (ref 0.61–1.24)
GFR, Estimated: >60 mL/min (ref >60)
Glucose, Bld: 111 mg/dL — ABNORMAL HIGH (ref 70–99)
Potassium: 3.9 mmol/L (ref 3.5–5.1)
Sodium: 138 mmol/L (ref 135–145)
Total Bilirubin: 0.8 mg/dL (ref 0.3–1.2)
Total Protein: 7.1 g/dL (ref 6.5–8.1)

## 2021-09-01 LAB — CBC
HCT: 45.8 % (ref 39.0–52.0)
Hemoglobin: 14.5 g/dL (ref 13.0–17.0)
MCH: 28.2 pg (ref 26.0–34.0)
MCHC: 31.7 g/dL (ref 30.0–36.0)
MCV: 89.1 fL (ref 80.0–100.0)
Platelets: 198 10*3/uL (ref 150–400)
RBC: 5.14 MIL/uL (ref 4.22–5.81)
RDW: 15.4 % (ref 11.5–15.5)
WBC: 13.3 10*3/uL — ABNORMAL HIGH (ref 4.0–10.5)
nRBC: 0 % (ref 0.0–0.2)

## 2021-09-01 LAB — ACETAMINOPHEN LEVEL: Acetaminophen (Tylenol), Serum: <10 ug/mL — ABNORMAL LOW (ref 10–30)

## 2021-09-01 LAB — RESP PANEL BY RT-PCR (FLU A&B, COVID) ARPGX2
Influenza A by PCR: POSITIVE — AB
Influenza B by PCR: NEGATIVE
SARS Coronavirus 2 by RT PCR: NEGATIVE

## 2021-09-01 LAB — ETHANOL: Alcohol, Ethyl (B): <10 mg/dL (ref <10)

## 2021-09-01 LAB — SALICYLATE LEVEL: Salicylate Lvl: <7 mg/dL — ABNORMAL LOW (ref 7.0–30.0)
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
## 2021-09-01 MED ORDER — RISPERIDONE 1 MG PO TABS
0.5000 mg | ORAL_TABLET | Freq: Once | ORAL | Status: AC
Start: 2021-09-01 — End: 2021-09-01
  Administered 2021-09-01: 0.5 mg via ORAL
  Filled 2021-09-01: qty 1

## 2021-09-01 MED ORDER — METOPROLOL TARTRATE 25 MG PO TABS
12.5000 mg | ORAL_TABLET | Freq: Once | ORAL | Status: AC
  Administered 2021-09-01: 12.5 mg via ORAL
  Filled 2021-09-01: qty 1

## 2021-09-01 MED ORDER — OSELTAMIVIR PHOSPHATE 75 MG PO CAPS: 75.0000 mg | ORAL_CAPSULE | Freq: Two times a day (BID) | ORAL | 0 refills | Status: AC

## 2021-09-01 NOTE — ED Notes (Signed)
Report given to [facility], Therapist, sports. Pt to hall 11 to wait for transport in the am. Unable to contact friend/family or taxi for pt. VM left. Pt given discharge instructions, verbalized understanding, denied questions. Pt A&Ox 3, ambulatory w/in room, denies any needs at this time.

## 2021-09-01 NOTE — ED Notes (Signed)
Introduced self to pt for first time. Pt resting comfortably in bed at this time. Pt is alert and oriented with even and regular respirations. No acute distress noted. Pt denies any needs at this time.   Per previous RN, pt is to be discharged in the morning by cab service due to service not being available at night. Pt understands plan of care. Discharge paperwork was given to pt and went over by previous RN.

## 2021-09-01 NOTE — ED Notes (Signed)
TOC consult 
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
## 2021-09-01 NOTE — ED Notes (Signed)
Pt @ x-ray
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
## 2021-09-01 NOTE — ED Notes (Signed)
Attempted to call both numbers in chart for a ride home, safe transport does not take pt's to residence after 6pm. Pt states he does not have anyone else to come get him.

## 2021-09-01 NOTE — ED Triage Notes (Signed)
Pt appears to be having flight of ideas.

## 2021-09-02 ENCOUNTER — Telehealth: Payer: Self-pay | Admitting: Nurse Practitioner

## 2021-09-02 MED ORDER — ACETAMINOPHEN 500 MG PO TABS
1000.0000 mg | ORAL_TABLET | Freq: Once | ORAL | Status: AC
  Administered 2021-09-02: 1000 mg via ORAL
  Filled 2021-09-02: qty 2

## 2021-09-02 NOTE — Telephone Encounter (Signed)
Copied from [place] [id number]. Topic: Quick Communication - Home Health Verbal Orders >> Sep 02, 2021 11:49 AM Jodie Echevaria wrote: Caller/Agency: Merleen Nicely // [facility] Number: [phone] ok to LM  Requesting OT/PT/Skilled Nursing/Social Work/Speech Therapy: Home Health  Frequency: Trying to see patient and establish care this weekend

## 2021-09-02 NOTE — TOC Initial Note (Signed)
Transition of Care [facility]) - Initial/Assessment Note    Patient Details  Name: Evan [name] MRN: [id number] Date of Birth: [birth year]  [facility]) CM/SW Contact:    Shelbie Hutching, RN Phone Number: 09/02/2021, 9:13 AM  Clinical Narrative:                 Patient came back to the emergency department yesterday with multiple complaints and wanting placement in facility.  Bedside RN today reports that patient wants to go home and follow up with SW outpatient.  [facility] with [facility] last visit to ED on 1/23.   TOC signed off.   Expected Discharge Plan: [place] Barriers to Discharge: Barriers Resolved   Patient Goals and CMS Choice Patient states their goals for this hospitalization and ongoing recovery are:: similar complaints as last time here      Expected Discharge Plan and Services Expected Discharge Plan: [place]   Discharge Planning Services: CM Consult Post Acute Care Choice: Resumption of Svcs/PTA Provider Living arrangements for the past 2 months: Apartment                 DME Arranged: N/A DME Agency: NA       HH Arranged: RN, PT, Social Work [place] Agency: [facility] Date HH Agency Contacted: 09/02/21 Time HH Agency Contacted: [id number] Representative spoke with at [place]: [place] Arrangements/Services Living arrangements for the past 2 months: [place] with:: Self Patient language and need for interpreter reviewed:: Yes Do you feel safe going back to the place where you live?: Yes      Need for Family Participation in Patient Care: Yes (Comment) Care giver support system in place?: No (comment) Current home services: DME (rolling walker) Criminal Activity/Legal Involvement Pertinent to Current Situation/Hospitalization: No - Comment as needed  Activities of Daily Living      Permission Sought/Granted   Permission granted to share information with : Yes,  Verbal Permission Granted              Emotional Assessment Appearance:: Appears stated age Attitude/Demeanor/Rapport: Attention Seeking, Inconsistent   Orientation: : Oriented to Self, Oriented to Place, Oriented to  Time, Oriented to Situation Alcohol / Substance Use: Not Applicable Psych Involvement: No (comment)  Admission diagnosis:  Vertigo Patient Active Problem List   Diagnosis Date Noted   Memory changes 08/12/2021   Other thrombophilia ([place]) 12/25/2020   Vitamin D deficiency 12/04/2020   Vitamin B12 deficiency 12/04/2020   Stenosis of right carotid artery 12/03/2020   History of CVA (cerebrovascular accident) 11/06/2020   History of 2019 novel coronavirus disease (COVID-19) 09/27/2020   Atherosclerosis of aorta ([place]) 12/04/2019   Persistent atrial fibrillation ([place]) 09/12/2019   CAD (coronary artery disease) 09/12/2019   Chronic venous stasis 09/12/2019   Hoarding disorder 09/12/2019   Cervical spinal stenosis 09/12/2019   Diabetic retinopathy ([place]) 09/12/2019   COPD, mild ([place]) 09/12/2019   Osteoporosis 09/09/2019   History of prostate cancer 09/09/2019   Paranoid type schizophrenia ([place]) 08/10/2019   Obstructive sleep apnea on CPAP 08/10/2019   Hyperlipidemia associated with type 2 diabetes mellitus ([place]) 08/10/2019   Chronic diastolic CHF (congestive heart failure) ([place]) 01/09/2014   Esophageal dysmotility 09/12/2013   Type 2 diabetes mellitus with morbid obesity ([place]) 02/14/2012   Obesity, morbid (more than 100 lbs over ideal weight or BMI > 40) ([place]) 07/26/2011   OLD MYOCARDIAL  INFARCTION 03/02/2010   Hypertension associated with diabetes ([place]) 03/20/2009   CORONARY ATHEROSCLEROSIS, ARTERY BYPASS GRAFT 03/20/2009   PCP:  [name], NP Pharmacy:   [address], [place] [address] Phone: [phone] Fax: [phone]     Social Determinants of Health (SDOH) Interventions    Readmission  Risk Interventions No flowsheet data found.

## 2021-09-02 NOTE — Telephone Encounter (Signed)
Left message for Merleen Nicely providing her with verbal OK for order for the patient. Loraine Grip to give our office a call back if she has any questions.

## 2021-09-02 NOTE — ED Notes (Signed)
VOL/Pending discharge

## 2021-09-02 NOTE — TOC Transition Note (Signed)
Transition of Care [facility]) - CM/SW Discharge Note   Patient Details  Name: Evan [name] MRN: [id number] Date of Birth: Aug 17, 1955  Transition of Care [facility]) CM/SW Contact:  Shelbie Hutching, RN Phone Number: 09/02/2021, 9:56 AM   Clinical Narrative:    [facility] arranged by ED secretary to transport patient home.  Home Health orders are in and [facility] with [facility] notified that patient discharging back home.    Final next level of care: [place] Barriers to Discharge: Barriers Resolved   Patient Goals and CMS Choice Patient states their goals for this hospitalization and ongoing recovery are:: similar complaints as last time here      Discharge Placement                       Discharge Plan and Services   Discharge Planning Services: CM Consult Post Acute Care Choice: Resumption of Svcs/PTA Provider          DME Arranged: N/A DME Agency: NA       HH Arranged: RN, PT, Social Work [place] Agency: [facility] Date [place]: 09/02/21 Time [place]: 0912 Representative spoke with at [place]: [place] Determinants of Health ([place]) Interventions     Readmission Risk Interventions No flowsheet data found.

## 2021-09-04 DIAGNOSIS — Z7901 Long term (current) use of anticoagulants: Secondary | ICD-10-CM | POA: Diagnosis not present

## 2021-09-04 DIAGNOSIS — I509 Heart failure, unspecified: Secondary | ICD-10-CM | POA: Diagnosis not present

## 2021-09-04 DIAGNOSIS — Z9181 History of falling: Secondary | ICD-10-CM | POA: Diagnosis not present

## 2021-09-04 DIAGNOSIS — Z7984 Long term (current) use of oral hypoglycemic drugs: Secondary | ICD-10-CM | POA: Diagnosis not present

## 2021-09-04 DIAGNOSIS — I4891 Unspecified atrial fibrillation: Secondary | ICD-10-CM | POA: Diagnosis not present

## 2021-09-04 DIAGNOSIS — J45909 Unspecified asthma, uncomplicated: Secondary | ICD-10-CM | POA: Diagnosis not present

## 2021-09-04 DIAGNOSIS — Z7982 Long term (current) use of aspirin: Secondary | ICD-10-CM | POA: Diagnosis not present

## 2021-09-04 DIAGNOSIS — Z7951 Long term (current) use of inhaled steroids: Secondary | ICD-10-CM | POA: Diagnosis not present

## 2021-09-04 DIAGNOSIS — Z8673 Personal history of transient ischemic attack (TIA), and cerebral infarction without residual deficits: Secondary | ICD-10-CM | POA: Diagnosis not present

## 2021-09-05 ENCOUNTER — Telehealth: Payer: Self-pay | Admitting: Nurse Practitioner

## 2021-09-05 DIAGNOSIS — Z7901 Long term (current) use of anticoagulants: Secondary | ICD-10-CM | POA: Diagnosis not present

## 2021-09-05 DIAGNOSIS — Z7984 Long term (current) use of oral hypoglycemic drugs: Secondary | ICD-10-CM | POA: Diagnosis not present

## 2021-09-05 DIAGNOSIS — Z7982 Long term (current) use of aspirin: Secondary | ICD-10-CM | POA: Diagnosis not present

## 2021-09-05 DIAGNOSIS — Z7951 Long term (current) use of inhaled steroids: Secondary | ICD-10-CM | POA: Diagnosis not present

## 2021-09-05 DIAGNOSIS — Z8673 Personal history of transient ischemic attack (TIA), and cerebral infarction without residual deficits: Secondary | ICD-10-CM | POA: Diagnosis not present

## 2021-09-05 DIAGNOSIS — Z9181 History of falling: Secondary | ICD-10-CM | POA: Diagnosis not present

## 2021-09-05 DIAGNOSIS — I4891 Unspecified atrial fibrillation: Secondary | ICD-10-CM | POA: Diagnosis not present

## 2021-09-05 DIAGNOSIS — I509 Heart failure, unspecified: Secondary | ICD-10-CM | POA: Diagnosis not present

## 2021-09-05 DIAGNOSIS — J45909 Unspecified asthma, uncomplicated: Secondary | ICD-10-CM | POA: Diagnosis not present

## 2021-09-05 NOTE — Telephone Encounter (Unsigned)
Home Health Verbal Orders - Caller/Agency: [facility]/ [facility] Callback Number: [phone]/ vm can be left  Requesting OT Frequency: 2xs a week for 2 weeks and 1x a week for 1 week

## 2021-09-05 NOTE — Telephone Encounter (Signed)
Left message for [facility] with [place] for patient. Evan [name] to give our office a call back if she has any questions regarding the verbal orders.

## 2021-09-06 DIAGNOSIS — I25118 Atherosclerotic heart disease of native coronary artery with other forms of angina pectoris: Secondary | ICD-10-CM

## 2021-09-06 DIAGNOSIS — E1159 Type 2 diabetes mellitus with other circulatory complications: Secondary | ICD-10-CM

## 2021-09-06 DIAGNOSIS — I152 Hypertension secondary to endocrine disorders: Secondary | ICD-10-CM

## 2021-09-06 DIAGNOSIS — J449 Chronic obstructive pulmonary disease, unspecified: Secondary | ICD-10-CM

## 2021-09-06 DIAGNOSIS — I5032 Chronic diastolic (congestive) heart failure: Secondary | ICD-10-CM

## 2021-09-06 DIAGNOSIS — M810 Age-related osteoporosis without current pathological fracture: Secondary | ICD-10-CM

## 2021-09-07 ENCOUNTER — Ambulatory Visit (INDEPENDENT_AMBULATORY_CARE_PROVIDER_SITE_OTHER): Payer: Medicare Other | Admitting: Licensed Clinical Social Worker

## 2021-09-07 ENCOUNTER — Telehealth: Payer: Self-pay | Admitting: Nurse Practitioner

## 2021-09-07 DIAGNOSIS — E1169 Type 2 diabetes mellitus with other specified complication: Secondary | ICD-10-CM

## 2021-09-07 DIAGNOSIS — I5032 Chronic diastolic (congestive) heart failure: Secondary | ICD-10-CM

## 2021-09-07 DIAGNOSIS — Z7951 Long term (current) use of inhaled steroids: Secondary | ICD-10-CM | POA: Diagnosis not present

## 2021-09-07 DIAGNOSIS — I509 Heart failure, unspecified: Secondary | ICD-10-CM | POA: Diagnosis not present

## 2021-09-07 DIAGNOSIS — J45909 Unspecified asthma, uncomplicated: Secondary | ICD-10-CM | POA: Diagnosis not present

## 2021-09-07 DIAGNOSIS — E1159 Type 2 diabetes mellitus with other circulatory complications: Secondary | ICD-10-CM

## 2021-09-07 DIAGNOSIS — I4891 Unspecified atrial fibrillation: Secondary | ICD-10-CM | POA: Diagnosis not present

## 2021-09-07 DIAGNOSIS — Z8673 Personal history of transient ischemic attack (TIA), and cerebral infarction without residual deficits: Secondary | ICD-10-CM | POA: Diagnosis not present

## 2021-09-07 DIAGNOSIS — Z7982 Long term (current) use of aspirin: Secondary | ICD-10-CM | POA: Diagnosis not present

## 2021-09-07 DIAGNOSIS — Z7984 Long term (current) use of oral hypoglycemic drugs: Secondary | ICD-10-CM | POA: Diagnosis not present

## 2021-09-07 DIAGNOSIS — Z7901 Long term (current) use of anticoagulants: Secondary | ICD-10-CM | POA: Diagnosis not present

## 2021-09-07 DIAGNOSIS — F200 Paranoid schizophrenia: Secondary | ICD-10-CM

## 2021-09-07 DIAGNOSIS — Z9181 History of falling: Secondary | ICD-10-CM | POA: Diagnosis not present

## 2021-09-07 NOTE — Telephone Encounter (Signed)
Copied from [place] [phone]. Topic: General - Inquiry >> Sep 06, 2021 10:32 AM Oneta Rack wrote: Patient phone is disconnected and temporarily out of service and wanted to make [facility] and PCP aware just in case they needed to reach him,

## 2021-09-07 NOTE — Telephone Encounter (Signed)
Noted  

## 2021-09-08 ENCOUNTER — Telehealth: Payer: Self-pay | Admitting: Nurse Practitioner

## 2021-09-08 ENCOUNTER — Other Ambulatory Visit: Payer: Self-pay

## 2021-09-08 ENCOUNTER — Ambulatory Visit (INDEPENDENT_AMBULATORY_CARE_PROVIDER_SITE_OTHER): Payer: Medicare Other | Admitting: Podiatry

## 2021-09-08 DIAGNOSIS — E1129 Type 2 diabetes mellitus with other diabetic kidney complication: Secondary | ICD-10-CM

## 2021-09-08 DIAGNOSIS — B351 Tinea unguium: Secondary | ICD-10-CM | POA: Diagnosis not present

## 2021-09-08 DIAGNOSIS — R809 Proteinuria, unspecified: Secondary | ICD-10-CM

## 2021-09-08 DIAGNOSIS — Z7982 Long term (current) use of aspirin: Secondary | ICD-10-CM | POA: Diagnosis not present

## 2021-09-08 DIAGNOSIS — Z7984 Long term (current) use of oral hypoglycemic drugs: Secondary | ICD-10-CM | POA: Diagnosis not present

## 2021-09-08 DIAGNOSIS — I509 Heart failure, unspecified: Secondary | ICD-10-CM | POA: Diagnosis not present

## 2021-09-08 DIAGNOSIS — J45909 Unspecified asthma, uncomplicated: Secondary | ICD-10-CM | POA: Diagnosis not present

## 2021-09-08 DIAGNOSIS — M79674 Pain in right toe(s): Secondary | ICD-10-CM | POA: Diagnosis not present

## 2021-09-08 DIAGNOSIS — Z8673 Personal history of transient ischemic attack (TIA), and cerebral infarction without residual deficits: Secondary | ICD-10-CM | POA: Diagnosis not present

## 2021-09-08 DIAGNOSIS — Z7951 Long term (current) use of inhaled steroids: Secondary | ICD-10-CM | POA: Diagnosis not present

## 2021-09-08 DIAGNOSIS — M79675 Pain in left toe(s): Secondary | ICD-10-CM

## 2021-09-08 DIAGNOSIS — Z7901 Long term (current) use of anticoagulants: Secondary | ICD-10-CM | POA: Diagnosis not present

## 2021-09-08 DIAGNOSIS — Z9181 History of falling: Secondary | ICD-10-CM | POA: Diagnosis not present

## 2021-09-08 DIAGNOSIS — I4891 Unspecified atrial fibrillation: Secondary | ICD-10-CM | POA: Diagnosis not present

## 2021-09-08 NOTE — Telephone Encounter (Signed)
Patient would like PCP to set up [facility] for his cardiology appointment scheduled on 09/13/2021 with Rise Mu, PA-C, patient states he has a lot going on and unable to arrange transportation.

## 2021-09-08 NOTE — Telephone Encounter (Signed)
Copied from [place] [id number]. Topic: General - Other >> Sep 08, 2021  8:04 AM Valere Dross wrote: Reason for CRM: FYI: Pt called in to inform PCP that he was going to his foot doctor today, and just wanted to let her know. As well as to say thank you to PCP and [facility] for everything they have done. Pt did have me on the phone for about 15 mins to give this information.
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
## 2021-09-08 NOTE — Progress Notes (Signed)
°  Subjective:  Patient ID: Evan [name], male    DOB: 11-01-1955,  MRN: [id number]  Chief Complaint  Patient presents with   Nail Problem    Nail trim    66 y.o. male returns for the above complaint.  Patient presents with thickened elongated dystrophic toenails x10.  Patient is a diabetic.  He would like to do exam to make sure that his feet are doing good.  His A1c is 6.4.  He has been pretty well controlled diabetic.  He denies any other acute complaints.  He would like to have the nails debrided down.  Objective:  There were no vitals filed for this visit. Podiatric Exam: Vascular: dorsalis pedis and posterior tibial pulses are palpable bilateral. Capillary return is immediate. Temperature gradient is WNL. Skin turgor WNL  Sensorium: Normal Semmes Weinstein monofilament test. Normal tactile sensation bilaterally. Nail Exam: Pt has thick disfigured discolored nails with subungual debris noted bilateral entire nail hallux through fifth toenails.  Pain on palpation to the nails. Ulcer Exam: There is no evidence of ulcer or pre-ulcerative changes or infection. Orthopedic Exam: Muscle tone and strength are WNL. No limitations in general ROM. No crepitus or effusions noted. HAV  B/L.  Hammer toes 2-5  B/L. Skin: No Porokeratosis. No infection or ulcers    Assessment & Plan:   1. Pain due to onychomycosis of toenails of both feet   2. Type 2 diabetes mellitus with proteinuria (HCC)      Patient was evaluated and treated and all questions answered.  Onychomycosis with pain  -Nails palliatively debrided as below. -Educated on self-care  Procedure: Nail Debridement Rationale: pain  Type of Debridement: manual, sharp debridement. Instrumentation: Nail nipper, rotary burr. Number of Nails: 10  Procedures and Treatment: Consent by patient was obtained for treatment procedures. The patient understood the discussion of treatment and procedures well. All questions were answered  thoroughly reviewed. Debridement of mycotic and hypertrophic toenails, 1 through 5 bilateral and clearing of subungual debris. No ulceration, no infection noted.  Return Visit-Office Procedure: Patient instructed to return to the office for a follow up visit 3 months for continued evaluation and treatment.  Boneta Lucks, DPM    No follow-ups on file.

## 2021-09-10 DIAGNOSIS — Z7951 Long term (current) use of inhaled steroids: Secondary | ICD-10-CM | POA: Diagnosis not present

## 2021-09-10 DIAGNOSIS — J45909 Unspecified asthma, uncomplicated: Secondary | ICD-10-CM | POA: Diagnosis not present

## 2021-09-10 DIAGNOSIS — Z7982 Long term (current) use of aspirin: Secondary | ICD-10-CM | POA: Diagnosis not present

## 2021-09-10 DIAGNOSIS — I4891 Unspecified atrial fibrillation: Secondary | ICD-10-CM | POA: Diagnosis not present

## 2021-09-10 DIAGNOSIS — Z8673 Personal history of transient ischemic attack (TIA), and cerebral infarction without residual deficits: Secondary | ICD-10-CM | POA: Diagnosis not present

## 2021-09-10 DIAGNOSIS — Z7984 Long term (current) use of oral hypoglycemic drugs: Secondary | ICD-10-CM | POA: Diagnosis not present

## 2021-09-10 DIAGNOSIS — Z7901 Long term (current) use of anticoagulants: Secondary | ICD-10-CM | POA: Diagnosis not present

## 2021-09-10 DIAGNOSIS — I509 Heart failure, unspecified: Secondary | ICD-10-CM | POA: Diagnosis not present

## 2021-09-10 DIAGNOSIS — Z9181 History of falling: Secondary | ICD-10-CM | POA: Diagnosis not present

## 2021-09-12 ENCOUNTER — Ambulatory Visit: Payer: Self-pay
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
## 2021-09-12 NOTE — Patient Instructions (Signed)
Visit Information  Thank you for taking time to visit with me today. Please don't hesitate to contact me if I can be of assistance to you before our next scheduled telephone appointment.  Following are the goals we discussed today:  Patient Goals/Self-Care Activities: Over the next 120 days Attend all scheduled appointments with providers Contact office with any questions or concerns Utilize [facility] transportation number to assist with attending medical appointments Practice relaxation or meditation daily Practice positive thinking and self-talk  Our next appointment is by telephone on 09/14/21   Please call the care guide team at [phone] if you need to cancel or reschedule your appointment.   If you are experiencing a Mental Health or [place] or need someone to talk to, please call the [place] National Suicide Prevention Lifeline: (249) 586-2863 or TTY: (626) 486-7091 TTY (660)750-9483) to talk to a trained counselor call 911   Patient verbalizes understanding of instructions and care plan provided today and agrees to view in [place]. Active MyChart status confirmed with patient.    [name], MSW, [email or web address] Phone [phone] 6:41 AM

## 2021-09-12 NOTE — Telephone Encounter (Signed)
Patient called- he told agent- hx of stroke- today he had confusion, arm pain, and he is not sure if SE. Patient is very upset/paniced- he has called EMS. EMS has arrived and so call was disconnected so they could evaluate him.

## 2021-09-12 NOTE — Telephone Encounter (Signed)
Patient called, left VM to return the call to the office to discuss symptoms with a nurse.   Summary: arm pain advice & Flu Advice   Pt is calling to report that his right arm is hurting. Pt is taking oseltamivir (TAMIFLU) 75 MG capsule [[id number]]  ENDED. Pt is wanting to know is it ok if he takes ibuprofen will it be ok with his kidneys?
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
## 2021-09-13 ENCOUNTER — Emergency Department
Admission: EM | Admit: 2021-09-13 | Discharge: 2021-09-13 | Disposition: A | Discharge status: Home / Self Care | Payer: Medicare Other | Attending: Emergency Medicine | Admitting: Emergency Medicine

## 2021-09-13 ENCOUNTER — Telehealth: Payer: Medicare Other

## 2021-09-13 ENCOUNTER — Encounter: Payer: Self-pay | Admitting: Nurse Practitioner

## 2021-09-13 ENCOUNTER — Other Ambulatory Visit: Payer: Self-pay

## 2021-09-13 ENCOUNTER — Encounter: Payer: Self-pay | Admitting: Physician Assistant

## 2021-09-13 ENCOUNTER — Ambulatory Visit (INDEPENDENT_AMBULATORY_CARE_PROVIDER_SITE_OTHER): Payer: Medicare Other | Admitting: Nurse Practitioner

## 2021-09-13 VITALS — BP 110/74 | HR 98 | Ht 65.0 in | Wt 205.0 lb

## 2021-09-13 DIAGNOSIS — M79601 Pain in right arm: Secondary | ICD-10-CM | POA: Diagnosis not present

## 2021-09-13 DIAGNOSIS — L309 Dermatitis, unspecified: Secondary | ICD-10-CM | POA: Insufficient documentation

## 2021-09-13 DIAGNOSIS — Z951 Presence of aortocoronary bypass graft: Secondary | ICD-10-CM | POA: Diagnosis not present

## 2021-09-13 DIAGNOSIS — I2510 Atherosclerotic heart disease of native coronary artery without angina pectoris: Secondary | ICD-10-CM | POA: Insufficient documentation

## 2021-09-13 DIAGNOSIS — I10 Essential (primary) hypertension: Secondary | ICD-10-CM | POA: Diagnosis not present

## 2021-09-13 DIAGNOSIS — I509 Heart failure, unspecified: Secondary | ICD-10-CM | POA: Insufficient documentation

## 2021-09-13 DIAGNOSIS — I482 Chronic atrial fibrillation, unspecified: Secondary | ICD-10-CM | POA: Diagnosis not present

## 2021-09-13 DIAGNOSIS — E785 Hyperlipidemia, unspecified: Secondary | ICD-10-CM | POA: Diagnosis not present

## 2021-09-13 DIAGNOSIS — Z7901 Long term (current) use of anticoagulants: Secondary | ICD-10-CM

## 2021-09-13 DIAGNOSIS — F419 Anxiety disorder, unspecified: Secondary | ICD-10-CM

## 2021-09-13 MED ORDER — TRIAMCINOLONE ACETONIDE 0.1 % EX OINT: 1.0000 "application " | TOPICAL_OINTMENT | Freq: Two times a day (BID) | CUTANEOUS | 1 refills | Status: DC

## 2021-09-13 NOTE — Patient Instructions (Signed)
Medication Instructions:  No changes at this time.  *If you need a refill on your cardiac medications before your next appointment, please call your pharmacy*   Lab Work: None  If you have labs (blood work) drawn today and your tests are completely normal, you will receive your results only by: [place] (if you have MyChart) OR A paper copy in the mail If you have any lab test that is abnormal or we need to change your treatment, we will call you to review the results.   Testing/Procedures: None   Follow-Up: At [facility], you and your health needs are our priority.  As part of our continuing mission to provide you with exceptional heart care, we have created designated Provider Care Teams.  These Care Teams include your primary Cardiologist (physician) and Advanced Practice Providers (APPs -  Physician Assistants and Nurse Practitioners) who all work together to provide you with the care you need, when you need it.   Your next appointment:   6 month(s)  The format for your next appointment:   In Person  Provider:   Ida Rogue, MD or Christell Faith, PA-C

## 2021-09-13 NOTE — ED Provider Notes (Signed)
[facility] Emergency Department Provider Note     Event Date/Time   First MD Initiated Contact with Patient 09/13/21 1233     (approximate)   History   Arm Pain   HPI  Evan [name] is a 66 y.o. male with a history of AD status post CABG, obsessive-compulsive disorder, and CHF among other chronic conditions, presents to the ED from heart failure clinic, for evaluations of 2 months of right arm pain.  Patient denies any recent injury, trauma, fall.  Review of the patient's chart, notes that he had treatment for right elbow pain here in the ED a few months ago, and has been previously treated with nystatin cream/powder for skin changes to the right upper extremity.   Physical Exam   Triage Vital Signs: ED Triage Vitals  Enc Vitals Group     BP 09/13/21 1209 125/77     Pulse Rate 09/13/21 1207 97     Resp 09/13/21 1207 20     Temp 09/13/21 1207 97.7 F (36.5 C)     Temp Source 09/13/21 1207 Oral     SpO2 09/13/21 1207 97 %     Weight 09/13/21 1207 220 lb (99.8 kg)     Height 09/13/21 1207 5\' 5"  (1.651 m)     Head Circumference --      Peak Flow --      Pain Score 09/13/21 1207 8     Pain Loc --      Pain Edu? --      Excl. in [place]? --     Most recent vital signs: Vitals:   09/13/21 1207 09/13/21 1209  BP:  125/77  Pulse: 97   Resp: 20   Temp: 97.7 F (36.5 C)   SpO2: 97%     General Awake, no distress.  Head:   No acute traumatic findings CVS:  Good peripheral perfusion.  RESP:  Normal effort.  ABD:  No distention.  SKIN:  Right upper extremity with some erythematous changes with excoriations and audible superficial scabbed lesions appreciated.  No hypertrophic skin, warmth, induration, serosanguineous, or purulent drainage appreciated.     ED Results / Procedures / Treatments   Labs (all labs ordered are listed, but only abnormal results are displayed) Labs Reviewed - No data to display   EKG   RADIOLOGY   No results  found.   PROCEDURES:  Critical Care performed: No  Procedures   MEDICATIONS ORDERED IN ED: Medications - No data to display   IMPRESSION / MDM / [place] / ED COURSE  I reviewed the triage vital signs and the nursing notes.                              Differential diagnosis includes, but is not limited to, contact dermatitis, eczema, dermatophytosis, cellulitis  Patient with ED evaluation of chronic complaints of skin irritation primarily to the right upper extremity.  Patient presents in no acute distress for evaluation of his complaints.  Exam is reassuring as it shows some mild erythema, excoriations, and dry skin and peeling.  Patient's diagnosis is consistent with probable eczema. Patient will be discharged home with prescriptions for triamcinolone ointment. Patient is to follow up with his PCP as needed or otherwise directed. Patient is given ED precautions to return to the ED for any worsening or new symptoms.    FINAL CLINICAL IMPRESSION(S) / ED DIAGNOSES  Final diagnoses:  Dermatitis  Eczema, unspecified type     Rx / DC Orders   ED Discharge Orders          Ordered    triamcinolone ointment (KENALOG) 0.1 %  2 times daily        09/13/21 1257             Note:  This document was prepared using Dragon voice recognition software and may include unintentional dictation errors.    Melvenia Needles, PA-C 09/13/21 1304    Rada Hay, MD 09/13/21 [phone]

## 2021-09-13 NOTE — ED Notes (Signed)
Provider at bedside. Pt to ED for R arm pain and redness. Skin appears reddened from forearm to upper arm. Pt states this is ongoing since 3 days but was worse this morning.

## 2021-09-13 NOTE — ED Triage Notes (Signed)
Pt to ED from heart clinic for right arm pain for 2 months. No injury. Reports pain has gotten worse.  States "I do not like pain pills, but I might not have a choice" Voices frustration with living situation.

## 2021-09-13 NOTE — Discharge Instructions (Addendum)
Use the steroid cream as directed. Use petroleum jelly (Vaseline) as a daily moisturizer for your skin.

## 2021-09-14 ENCOUNTER — Telehealth: Payer: Self-pay | Admitting: Nurse Practitioner

## 2021-09-14 ENCOUNTER — Ambulatory Visit: Payer: Medicare Other | Admitting: Licensed Clinical Social Worker

## 2021-09-14 DIAGNOSIS — Z7951 Long term (current) use of inhaled steroids: Secondary | ICD-10-CM | POA: Diagnosis not present

## 2021-09-14 DIAGNOSIS — Z8673 Personal history of transient ischemic attack (TIA), and cerebral infarction without residual deficits: Secondary | ICD-10-CM | POA: Diagnosis not present

## 2021-09-14 DIAGNOSIS — M810 Age-related osteoporosis without current pathological fracture: Secondary | ICD-10-CM

## 2021-09-14 DIAGNOSIS — Z7901 Long term (current) use of anticoagulants: Secondary | ICD-10-CM | POA: Diagnosis not present

## 2021-09-14 DIAGNOSIS — E1159 Type 2 diabetes mellitus with other circulatory complications: Secondary | ICD-10-CM

## 2021-09-14 DIAGNOSIS — E1169 Type 2 diabetes mellitus with other specified complication: Secondary | ICD-10-CM

## 2021-09-14 DIAGNOSIS — I25118 Atherosclerotic heart disease of native coronary artery with other forms of angina pectoris: Secondary | ICD-10-CM

## 2021-09-14 DIAGNOSIS — Z7982 Long term (current) use of aspirin: Secondary | ICD-10-CM | POA: Diagnosis not present

## 2021-09-14 DIAGNOSIS — I152 Hypertension secondary to endocrine disorders: Secondary | ICD-10-CM

## 2021-09-14 DIAGNOSIS — J45909 Unspecified asthma, uncomplicated: Secondary | ICD-10-CM | POA: Diagnosis not present

## 2021-09-14 DIAGNOSIS — I4891 Unspecified atrial fibrillation: Secondary | ICD-10-CM | POA: Diagnosis not present

## 2021-09-14 DIAGNOSIS — Z7984 Long term (current) use of oral hypoglycemic drugs: Secondary | ICD-10-CM | POA: Diagnosis not present

## 2021-09-14 DIAGNOSIS — I509 Heart failure, unspecified: Secondary | ICD-10-CM | POA: Diagnosis not present

## 2021-09-14 DIAGNOSIS — Z9181 History of falling: Secondary | ICD-10-CM | POA: Diagnosis not present

## 2021-09-14 DIAGNOSIS — F200 Paranoid schizophrenia: Secondary | ICD-10-CM

## 2021-09-14 NOTE — Telephone Encounter (Signed)
Copied from [place] [id number]. Topic: General - Other >> Sep 14, 2021  1:50 PM Valere Dross wrote: Reason for CRM: Pt called in wanting to speak with [facility], to inform her about his living situation, please advise.

## 2021-09-15 ENCOUNTER — Telehealth: Payer: Self-pay | Admitting: Nurse Practitioner

## 2021-09-15 NOTE — Telephone Encounter (Signed)
Copied from [place] [id number]. Topic: General - Other >> Sep 15, 2021  9:21 AM Loma Boston wrote: Reason for CRM: pt wants Jasmine to return call as he states they are mad at him and going to throw him out and he needs a cb as he can't handle this wants heer to advise him. [phone]. Called office no PU

## 2021-09-15 NOTE — Telephone Encounter (Addendum)
Pt calling back stating his landlord just left and he is officially being evicted. He is requesting call back asap. Transferred call to [facility]

## 2021-09-16 ENCOUNTER — Telehealth: Payer: Self-pay | Admitting: Nurse Practitioner

## 2021-09-16 DIAGNOSIS — Z9181 History of falling: Secondary | ICD-10-CM | POA: Diagnosis not present

## 2021-09-16 DIAGNOSIS — I4891 Unspecified atrial fibrillation: Secondary | ICD-10-CM | POA: Diagnosis not present

## 2021-09-16 DIAGNOSIS — J45909 Unspecified asthma, uncomplicated: Secondary | ICD-10-CM | POA: Diagnosis not present

## 2021-09-16 DIAGNOSIS — Z7901 Long term (current) use of anticoagulants: Secondary | ICD-10-CM | POA: Diagnosis not present

## 2021-09-16 DIAGNOSIS — Z7984 Long term (current) use of oral hypoglycemic drugs: Secondary | ICD-10-CM | POA: Diagnosis not present

## 2021-09-16 DIAGNOSIS — Z7951 Long term (current) use of inhaled steroids: Secondary | ICD-10-CM | POA: Diagnosis not present

## 2021-09-16 DIAGNOSIS — Z8673 Personal history of transient ischemic attack (TIA), and cerebral infarction without residual deficits: Secondary | ICD-10-CM | POA: Diagnosis not present

## 2021-09-16 DIAGNOSIS — Z7982 Long term (current) use of aspirin: Secondary | ICD-10-CM | POA: Diagnosis not present

## 2021-09-16 DIAGNOSIS — I509 Heart failure, unspecified: Secondary | ICD-10-CM | POA: Diagnosis not present

## 2021-09-16 NOTE — Telephone Encounter (Signed)
Ivin Booty with [facility] called saying she feels Evan [name] needs mental health eval.  She feels he has extreme anxiety and is very depressed.  His home is in disarray and smells.She says although she is not afraid of him he does have outburst of anger.  She wants to know if assisted living is being put in place  She is asking to maybe to discontinue his OT therapy  SS Worker needs to see him  CB#  [phone]

## 2021-09-18 ENCOUNTER — Emergency Department
Admission: EM | Admit: 2021-09-18 | Discharge: 2021-09-18 | Disposition: A | Discharge status: Home / Self Care | Payer: Medicare Other | Attending: Emergency Medicine | Admitting: Emergency Medicine

## 2021-09-18 ENCOUNTER — Other Ambulatory Visit: Payer: Self-pay

## 2021-09-18 DIAGNOSIS — I499 Cardiac arrhythmia, unspecified: Secondary | ICD-10-CM | POA: Diagnosis not present

## 2021-09-18 DIAGNOSIS — R519 Headache, unspecified: Secondary | ICD-10-CM | POA: Diagnosis present

## 2021-09-18 DIAGNOSIS — F439 Reaction to severe stress, unspecified: Secondary | ICD-10-CM | POA: Insufficient documentation

## 2021-09-18 DIAGNOSIS — G4489 Other headache syndrome: Secondary | ICD-10-CM | POA: Diagnosis not present

## 2021-09-18 DIAGNOSIS — E119 Type 2 diabetes mellitus without complications: Secondary | ICD-10-CM | POA: Diagnosis not present

## 2021-09-18 DIAGNOSIS — F430 Acute stress reaction: Secondary | ICD-10-CM | POA: Diagnosis not present

## 2021-09-18 DIAGNOSIS — R110 Nausea: Secondary | ICD-10-CM | POA: Insufficient documentation

## 2021-09-18 DIAGNOSIS — Z743 Need for continuous supervision: Secondary | ICD-10-CM | POA: Diagnosis not present

## 2021-09-18 DIAGNOSIS — R6889 Other general symptoms and signs: Secondary | ICD-10-CM | POA: Diagnosis not present

## 2021-09-18 MED ORDER — ONDANSETRON HCL 4 MG/2ML IJ SOLN
4.0000 mg | Freq: Once | INTRAMUSCULAR | Status: AC
  Administered 2021-09-18: 4 mg via INTRAVENOUS
  Filled 2021-09-18: qty 2
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
## 2021-09-18 NOTE — ED Notes (Signed)
Pt given lunch tray.
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
## 2021-09-18 NOTE — Patient Instructions (Signed)
Managing Anxiety, Adult ?After being diagnosed with anxiety, you may be relieved to know why you have felt or behaved a certain way. You may also feel overwhelmed about the treatment ahead and what it will mean for your life. With care and support, you can manage this condition. ?How to manage lifestyle changes ?Managing stress and anxiety ?Stress is your body's reaction to life changes and events, both good and bad. Most stress will last just a few hours, but stress can be ongoing and can lead to more than just stress. Although stress can play a major role in anxiety, it is not the same as anxiety. Stress is usually caused by something external, such as a deadline, test, or competition. Stress normally passes after the triggering event has ended.  ?Anxiety is caused by something internal, such as imagining a terrible outcome or worrying that something will go wrong that will devastate you. Anxiety often does not go away even after the triggering event is over, and it can become long-term (chronic) worry. It is important to understand the differences between stress and anxiety and to manage your stress effectively so that it does not lead to an anxious response. ?Talk with your health care provider or a counselor to learn more about reducing anxiety and stress. He or she may suggest tension reduction techniques, such as: ?Music therapy. Spend time creating or listening to music that you enjoy and that inspires you. ?Mindfulness-based meditation. Practice being aware of your normal breaths while not trying to control your breathing. It can be done while sitting or walking. ?Centering prayer. This involves focusing on a word, phrase, or sacred image that means something to you and brings you peace. ?Deep breathing. To do this, expand your stomach and inhale slowly through your nose. Hold your breath for 3-5 seconds. Then exhale slowly, letting your stomach muscles relax. ?Self-talk. Learn to notice and identify  thought patterns that lead to anxiety reactions and change those patterns to thoughts that feel peaceful. ?Muscle relaxation. Taking time to tense muscles and then relax them. ?Choose a tension reduction technique that fits your lifestyle and personality. These techniques take time and practice. Set aside 5-15 minutes a day to do them. Therapists can offer counseling and training in these techniques. The training to help with anxiety may be covered by some insurance plans. ?Other things you can do to manage stress and anxiety include: ?Keeping a stress diary. This can help you learn what triggers your reaction and then learn ways to manage your response. ?Thinking about how you react to certain situations. You may not be able to control everything, but you can control your response. ?Making time for activities that help you relax and not feeling guilty about spending your time in this way. ?Doing visual imagery. This involves imagining or creating mental pictures to help you relax. ?Practicing yoga. Through yoga poses, you can lower tension and promote relaxation. ? ?Medicines ?Medicines can help ease symptoms. Medicines for anxiety include: ?Antidepressant medicines. These are usually prescribed for long-term daily control. ?Anti-anxiety medicines. These may be added in severe cases, especially when panic attacks occur. ?Medicines will be prescribed by a health care provider. When used together, medicines, psychotherapy, and tension reduction techniques may be the most effective treatment. ?Relationships ?Relationships can play a big part in helping you recover. Try to spend more time connecting with trusted friends and family members. ?Consider going to couples counseling if you have a partner, taking family education classes, or going to family   therapy. ?Therapy can help you and others better understand your condition. ?How to recognize changes in your anxiety ?Everyone responds differently to treatment for  anxiety. Recovery from anxiety happens when symptoms decrease and stop interfering with your daily activities at home or work. This may mean that you will start to: ?Have better concentration and focus. Worry will interfere less in your daily thinking. ?Sleep better. ?Be less irritable. ?Have more energy. ?Have improved memory. ?It is also important to recognize when your condition is getting worse. Contact your health care provider if your symptoms interfere with home or work and you feel like your condition is not improving. ?Follow these instructions at home: ?Activity ?Exercise. Adults should do the following: ?Exercise for at least 150 minutes each week. The exercise should increase your heart rate and make you sweat (moderate-intensity exercise). ?Strengthening exercises at least twice a week. ?Get the right amount and quality of sleep. Most adults need 7-9 hours of sleep each night. ?Lifestyle ? ?Eat a healthy diet that includes plenty of vegetables, fruits, whole grains, low-fat dairy products, and lean protein. ?Do not eat a lot of foods that are high in fats, added sugars, or salt (sodium). ?Make choices that simplify your life. ?Do not use any products that contain nicotine or tobacco. These products include cigarettes, chewing tobacco, and vaping devices, such as e-cigarettes. If you need help quitting, ask your health care provider. ?Avoid caffeine, alcohol, and certain over-the-counter cold medicines. These may make you feel worse. Ask your pharmacist which medicines to avoid. ?General instructions ?Take over-the-counter and prescription medicines only as told by your health care provider. ?Keep all follow-up visits. This is important. ?Where to find support ?You can get help and support from these sources: ?Self-help groups. ?Online and community organizations. ?A trusted spiritual leader. ?Couples counseling. ?Family education classes. ?Family therapy. ?Where to find more information ?You may find  that joining a support group helps you deal with your anxiety. The following sources can help you locate counselors or support groups near you: ?Mental Health America: www.mentalhealthamerica.net ?Anxiety and Depression Association of America (ADAA): www.adaa.org ?National Alliance on Mental Illness (NAMI): www.nami.org ?Contact a health care provider if: ?You have a hard time staying focused or finishing daily tasks. ?You spend many hours a day feeling worried about everyday life. ?You become exhausted by worry. ?You start to have headaches or frequently feel tense. ?You develop chronic nausea or diarrhea. ?Get help right away if: ?You have a racing heart and shortness of breath. ?You have thoughts of hurting yourself or others. ?If you ever feel like you may hurt yourself or others, or have thoughts about taking your own life, get help right away. Go to your nearest emergency department or: ?Call your local emergency services (911 in the [place] ?Call a suicide crisis helpline, such as the National Suicide Prevention Lifeline at 1-800-273-8255 or 988 in the [place] This is open 24 hours a day in the [place] ?Text the Crisis Text Line at 741741 (in the [place] ?Summary ?Taking steps to learn and use tension reduction techniques can help calm you and help prevent triggering an anxiety reaction. ?When used together, medicines, psychotherapy, and tension reduction techniques may be the most effective treatment. ?Family, friends, and partners can play a big part in supporting you. ?This information is not intended to replace advice given to you by your health care provider. Make sure you discuss any questions you have with your health care provider. ?Document Revised: 02/16/2021 Document Reviewed: 11/14/2020 ?Elsevier Patient   Education ? 2022 Elsevier Inc. ? ?

## 2021-09-18 NOTE — Consult Note (Signed)
[facility] Face-to-Face Psychiatry Consult   Reason for Consult:  Delusional Referring Physician:  EDP Patient Identification: Evan [name] MRN:  [id number] Principal Diagnosis: Stress reaction with mixed disturbance of emotions and conduct Diagnosis:  Active Problems:   Stress reaction causing mixed disturbance of emotion and conduct   Total Time spent with patient: 45 minutes  Subjective:   Evan [name] is a 66 y.o. male patient admitted with health concerns.  HPI:  66 yo male who presented with health concerns, recently treated for the flu.  He is friendly and engaging during the assessment.  No delusions noted on assessment nor distress. Denies suicidal/homicidal ideations, substance abuse, and hallucinations.  He got upset with his landlord earlier this week because she demeaned him in front of others which upset him.  Heartily eating his lunch and provided the name of the social worker in the community who is working on helping him move to an assisted living facility.  Then, he showed me all his friends on Facebook including famous people.  Denies depression and anxiety or other mental health concerns.  His right foot is bothering him, nothing else.   Past Psychiatric History: anxiety, panic d/o  Risk to Self:  none Risk to Others:  none Prior Inpatient Therapy:  denies Prior Outpatient Therapy:  none  Past Medical History:  Past Medical History:  Diagnosis Date   Anginal pain ([place])    Anxiety disorder    Asthma    Atresia of esophagus without fistula    CAD (coronary artery disease)    Cellulitis    CHF (congestive heart failure) (HCC)    NYHA CLASS III,CHRONIC,DIASTOLIC   COPD (chronic obstructive pulmonary disease) ([place])    COVID-19    Diabetes mellitus without complication (HCC)    Edema    RIGHT LOWER LEG   Gastroesophageal reflux    H/O: GI bleed    History of pneumonia    Remote   History of scarlet fever    Childhood   Hyperlipidemia    Hypertension     Myocardial infarction ([place]) 2009   Obesity    Obstructive sleep apnea    Pain    CHRONIC BACK / ABDOMINAL   Panic disorder    Peripheral venous insufficiency    PTSD (post-traumatic stress disorder)    Retinopathy    DIABETIC   Stasis, venous    Stroke [facility])    Vertigo     Past Surgical History:  Procedure Laterality Date   CARDIAC CATHETERIZATION     CATARACT EXTRACTION Left    CATARACT EXTRACTION W/PHACO Right 05/03/2017   Procedure: CATARACT EXTRACTION PHACO AND INTRAOCULAR LENS PLACEMENT (IOC);  Surgeon: Leandrew Koyanagi, MD;  Location: [facility] ORS;  Service: Ophthalmology;  Laterality: Right;  [place] 00:35.3 AP% 12.3 CDE 4.33 Fluid Pack lot # [id number]        COLONOSCOPY WITH PROPOFOL N/A 11/26/2020   Procedure: COLONOSCOPY WITH PROPOFOL;  Surgeon: Jonathon Bellows, MD;  Location: [facility] ENDOSCOPY;  Service: Gastroenterology;  Laterality: N/A;  ANNETTE TO PICK UP [phone]   CORONARY ANGIOPLASTY WITH STENT PLACEMENT  2002   CORONARY ANGIOPLASTY WITH STENT PLACEMENT  1999   CORONARY ARTERY BYPASS GRAFT     x7   ESOPHAGOGASTRODUODENOSCOPY N/A 09/19/2016   Procedure: ESOPHAGOGASTRODUODENOSCOPY (EGD);  Surgeon: Lollie Sails, MD;  Location: [facility] ENDOSCOPY;  Service: Endoscopy;  Laterality: N/A;   ESOPHAGOGASTRODUODENOSCOPY (EGD) WITH PROPOFOL  11/26/2020   Procedure: ESOPHAGOGASTRODUODENOSCOPY (EGD) WITH PROPOFOL;  Surgeon: Jonathon Bellows, MD;  Location:  [facility] ENDOSCOPY;  Service: Gastroenterology;;   Family History:  Family History  Problem Relation Age of Onset   Heart attack Mother    Hypertension Mother    Hyperlipidemia Mother    Heart attack Brother 47       MI   Coronary artery disease Other    Family Psychiatric  History: unknown Social History:  Social History   Substance and Sexual Activity  Alcohol Use No     Social History   Substance and Sexual Activity  Drug Use No    Social History   Socioeconomic History   Marital status: Single    Spouse name:  Not on file   Number of children: Not on file   Years of education: Not on file   Highest education level: Not on file  Occupational History    Comment: disabled  Tobacco Use   Smoking status: Never   Smokeless tobacco: Never  Vaping Use   Vaping Use: Never used  Substance and Sexual Activity   Alcohol use: No   Drug use: No   Sexual activity: Not Currently  Other Topics Concern   Not on file  Social History Narrative   Disabled   Single   Social Determinants of Health   Financial Resource Strain: Low Risk    Difficulty of Paying Living Expenses: Not hard at all  Food Insecurity: No Food Insecurity   Worried About Charity fundraiser in the Last Year: Never true   [place] in the Last Year: Never true  Transportation Needs: No Transportation Needs   Lack of Transportation (Medical): No   Lack of Transportation (Non-Medical): No  Physical Activity: Sufficiently Active   Days of Exercise per Week: 7 days   Minutes of Exercise per Session: 30 min  Stress: No Stress Concern Present   Feeling of Stress : Not at all  Social Connections: Moderately Integrated   Frequency of Communication with Friends and Family: More than three times a week   Frequency of Social Gatherings with Friends and Family: More than three times a week   Attends Religious Services: More than 4 times per year   Active Member of [employer] or Organizations: Yes   Attends Music therapist: More than 4 times per year   Marital Status: Never married   Additional Social History:    Allergies:   Allergies  Allergen Reactions   Penicillin G Hives   Sulfa Antibiotics Hives   Tiotropium    Zoloft [Sertraline Hcl] Other (See Comments)    Labs: No results found for this or any previous visit (from the past 48 hour(s)).  No current facility-administered medications for this encounter.   Current Outpatient Medications  Medication Sig Dispense Refill   albuterol (VENTOLIN HFA) 108 (90  Base) MCG/ACT inhaler Inhale 2 puffs into the lungs every 4 (four) hours as needed for wheezing or shortness of breath. (Patient not taking: Reported on 09/13/2021) 1 each 0   aspirin 81 MG chewable tablet Chew 1 tablet (81 mg total) by mouth daily. 90 tablet 4   Blood Glucose Monitoring Suppl (ONETOUCH VERIO) w/Device KIT Use to check blood sugar 3 to 4 times a day and document.  Please bring to visits for review. 1 kit 0   Cholecalciferol 125 MCG (5000 UT) TABS Take 1 tablet (5,000 Units total) by mouth daily. 90 tablet 4   Elastic Bandages & Supports (MEDICAL COMPRESSION STOCKINGS) MISC 1 Package by Does not apply route daily. (  Patient not taking: Reported on 09/13/2021) 6 each 6   empagliflozin (JARDIANCE) 10 MG TABS tablet Take 1 tablet (10 mg total) by mouth daily. 90 tablet 4   ezetimibe (ZETIA) 10 MG tablet Take 1 tablet (10 mg total) by mouth daily. 90 tablet 4   furosemide (LASIX) 40 MG tablet TAKE 2 TABLETS BY MOUTH ONCE EVERY MORNING AND 1 TABLET ONCE EVERY EVENING 270 tablet 4   glucose blood (ONETOUCH VERIO) test strip Use to check blood sugar 3 to 4 times a day. 100 each 12   metoprolol succinate (TOPROL XL) 50 MG 24 hr tablet Take 1 tablet (50 mg total) by mouth daily. 90 tablet 4   nitroGLYCERIN (NITROSTAT) 0.4 MG SL tablet Place 1 tablet (0.4 mg total) under the tongue every 5 (five) minutes as needed for chest pain. 120 tablet 1   OneTouch Delica Lancets 35T MISC Use to check blood sugar 2-3 times a day. 100 each 5   pantoprazole (PROTONIX) 40 MG tablet Take 1 tablet (40 mg total) by mouth daily. 90 tablet 4   risperiDONE (RISPERDAL) 0.5 MG tablet Take 1 tablet (0.5 mg total) by mouth 2 (two) times daily. 180 tablet 4   rivaroxaban (XARELTO) 20 MG TABS tablet Take 1 tablet (20 mg total) by mouth at bedtime. 90 tablet 4   Saccharomyces boulardii (PROBIOTIC) 250 MG CAPS Take 1 capsule by mouth in the morning and at bedtime. 30 capsule 0   sharps container 1 each by Does not apply route  as needed. 1 each 1   Skin Protectants, Misc. (EUCERIN) cream Apply topically as needed for dry skin. 454 g 0   SYMBICORT 80-4.5 MCG/ACT inhaler INHALE 2 PUFFS BY MOUTH TWICE DAILY 1 each 4   triamcinolone ointment (KENALOG) 0.1 % Apply 1 application topically 2 (two) times daily. Apply to skin of arms. 30 g 1   vitamin B-12 (CYANOCOBALAMIN) 1000 MCG tablet Take 1 tablet (1,000 mcg total) by mouth daily. 90 tablet 4    Musculoskeletal: Strength & Muscle Tone: within normal limits Gait & Station: normal Patient leans: N/A  Psychiatric Specialty Exam: Physical Exam Vitals and nursing note reviewed.  Constitutional:      Appearance: He is well-developed.  HENT:     Head: Normocephalic.  Pulmonary:     Effort: Pulmonary effort is normal.  Musculoskeletal:        General: Normal range of motion.     Cervical back: Normal range of motion.  Neurological:     Mental Status: He is alert and oriented to person, place, and time.  Psychiatric:        Attention and Perception: Attention and perception normal.        Mood and Affect: Mood and affect normal.        Speech: Speech normal.        Behavior: Behavior normal. Behavior is cooperative.        Thought Content: Thought content normal.        Cognition and Memory: Cognition and memory normal.        Judgment: Judgment normal.    Review of Systems  Musculoskeletal:        Right foot pain  All other systems reviewed and are negative.  Blood pressure 119/78, pulse 96, temperature 98.4 F (36.9 C), resp. rate 20, height _0  (1.651 m), weight 99 kg, SpO2 92 %.Body mass index is 36.32 kg/m.  General Appearance: Casual  Eye Contact:  Good  Speech:  Normal Rate  Volume:  Normal  Mood:  Euthymic  Affect:  Congruent  Thought Process:  Coherent and Descriptions of Associations: Intact  Orientation:  Full (Time, Place, and Person)  Thought Content:  WDL and Logical  Suicidal Thoughts:  No  Homicidal Thoughts:  No  Memory:   Immediate;   Good Recent;   Good Remote;   Good  Judgement:  Fair  Insight:  Fair  Psychomotor Activity:  Normal  Concentration:  Concentration: Good and Attention Span: Good  Recall:  Good  Fund of Knowledge:  Good  Language:  Good  Akathisia:  No  Handed:  Right  AIMS (if indicated):     Assets:  Housing Leisure Time Physical Health Resilience Social Support  ADL's:  Intact  Cognition:  WNL  Sleep:        Physical Exam: Physical Exam Vitals and nursing note reviewed.  Constitutional:      Appearance: He is well-developed.  HENT:     Head: Normocephalic.  Pulmonary:     Effort: Pulmonary effort is normal.  Musculoskeletal:        General: Normal range of motion.     Cervical back: Normal range of motion.  Neurological:     Mental Status: He is alert and oriented to person, place, and time.  Psychiatric:        Attention and Perception: Attention and perception normal.        Mood and Affect: Mood and affect normal.        Speech: Speech normal.        Behavior: Behavior normal. Behavior is cooperative.        Thought Content: Thought content normal.        Cognition and Memory: Cognition and memory normal.        Judgment: Judgment normal.   Review of Systems  Musculoskeletal:        Right foot pain  All other systems reviewed and are negative. Blood pressure 119/78, pulse 96, temperature 98.4 F (36.9 C), resp. rate 20, height _0  (1.651 m), weight 99 kg, SpO2 92 %. Body mass index is 36.32 kg/m.  Treatment Plan Summary: Stress reaction with mixed disturbance of emotions and conduct: -Follow up with community support team  Disposition: No evidence of imminent risk to self or others at present.   Patient does not meet criteria for psychiatric inpatient admission.  Waylan Boga, NP 09/18/2021 12:08 PM

## 2021-09-18 NOTE — ED Provider Notes (Signed)
[facility] Provider Note    Event Date/Time   First MD Initiated Contact with Patient 09/18/21 1112     (approximate)   History   Headache   HPI  Evan [name] is a 66 y.o. male with a history of diabetes, schizophrenia who comes to the ED reporting multiple symptoms of varying chronicity.  He does note that he was recently hospitalized with influenza.  He completed a course of Tamiflu, and is feeling much better.  He feels back to normal but still has some occasional nausea.  He is eating and drinking fine, no vomiting.  Also complains of discoloration of his right lower leg.  Notes that he is feeling stressed about his landlord because they embarrassed him in front of other tenants in the building.  Expresses some paranoid ideas.     Physical Exam   Triage Vital Signs: ED Triage Vitals  Enc Vitals Group     BP 09/18/21 1039 119/78     Pulse Rate 09/18/21 1039 96     Resp 09/18/21 1038 20     Temp 09/18/21 1038 98.4 F (36.9 C)     Temp src --      SpO2 09/18/21 1039 92 %     Weight 09/18/21 1021 218 lb 4.1 oz (99 kg)     Height 09/18/21 1021 5\' 5"  (1.651 m)     Head Circumference --      Peak Flow --      Pain Score 09/18/21 1020 8     Pain Loc --      Pain Edu? --      Excl. in [place]? --     Most recent vital signs: Vitals:   09/18/21 1038 09/18/21 1039  BP:  119/78  Pulse:  96  Resp: 20   Temp: 98.4 F (36.9 C)   SpO2:  92%     General: Awake, no distress.  CV:  Good peripheral perfusion.  Regular rate and rhythm.  Normal distal pulses.  Extensive varicose veins bilateral lower extremities Resp:  Normal effort.  Clear to auscultation bilaterally Abd:  No distention.  Soft and nontender Other:  No wounds.  No soft tissue inflammatory changes.  No pallor.  No calf tenderness or swelling.  Leg appearance is consistent with varicose veins and chronic venous stasis.   ED Results / Procedures / Treatments   Labs (all labs  ordered are listed, but only abnormal results are displayed) Labs Reviewed - No data to display   EKG     RADIOLOGY     PROCEDURES:  Critical Care performed: No  Procedures   MEDICATIONS ORDERED IN ED: Medications  ondansetron (ZOFRAN) injection 4 mg (4 mg Intravenous Given 09/18/21 1137)     IMPRESSION / MDM / [place] / ED COURSE  I reviewed the triage vital signs and the nursing notes.                              Patient presents with multiple complaints, none of which are acute.  Vital signs are normal, exam is unremarkable.  He is nontoxic.  He was seen by psychiatry who feels that he is at his baseline, not a danger to himself or others and suitable for outpatient follow-up.  Stable for discharge.     FINAL CLINICAL IMPRESSION(S) / ED DIAGNOSES   Final diagnoses:  Stress at home  Rx / DC Orders   ED Discharge Orders     None        Note:  This document was prepared using Dragon voice recognition software and may include unintentional dictation errors.   Carrie Mew, MD 09/18/21 1208

## 2021-09-18 NOTE — ED Notes (Signed)
E-signature not working at this time. Pt verbalized understanding of D/C instructions, prescriptions and follow up care with no further questions at this time. Pt in NAD and ambulatory at time of D/C. [employer] given to patient. All belongings returned at time of departure.

## 2021-09-19 DIAGNOSIS — I509 Heart failure, unspecified: Secondary | ICD-10-CM | POA: Diagnosis not present

## 2021-09-19 DIAGNOSIS — Z8673 Personal history of transient ischemic attack (TIA), and cerebral infarction without residual deficits: Secondary | ICD-10-CM | POA: Diagnosis not present

## 2021-09-19 DIAGNOSIS — Z7984 Long term (current) use of oral hypoglycemic drugs: Secondary | ICD-10-CM | POA: Diagnosis not present

## 2021-09-19 DIAGNOSIS — J45909 Unspecified asthma, uncomplicated: Secondary | ICD-10-CM | POA: Diagnosis not present

## 2021-09-19 DIAGNOSIS — Z7951 Long term (current) use of inhaled steroids: Secondary | ICD-10-CM | POA: Diagnosis not present

## 2021-09-19 DIAGNOSIS — Z7901 Long term (current) use of anticoagulants: Secondary | ICD-10-CM | POA: Diagnosis not present

## 2021-09-19 DIAGNOSIS — I4891 Unspecified atrial fibrillation: Secondary | ICD-10-CM | POA: Diagnosis not present

## 2021-09-19 DIAGNOSIS — Z7982 Long term (current) use of aspirin: Secondary | ICD-10-CM | POA: Diagnosis not present

## 2021-09-19 DIAGNOSIS — Z9181 History of falling: Secondary | ICD-10-CM | POA: Diagnosis not present

## 2021-09-20 ENCOUNTER — Encounter: Payer: Self-pay | Admitting: Nurse Practitioner

## 2021-09-20 ENCOUNTER — Other Ambulatory Visit: Payer: Self-pay

## 2021-09-20 ENCOUNTER — Ambulatory Visit (INDEPENDENT_AMBULATORY_CARE_PROVIDER_SITE_OTHER): Payer: Medicare Other | Admitting: Nurse Practitioner

## 2021-09-20 VITALS — BP 99/65 | HR 84 | Temp 98.1°F | Ht 65.0 in | Wt 201.4 lb

## 2021-09-20 DIAGNOSIS — F200 Paranoid schizophrenia: Secondary | ICD-10-CM

## 2021-09-20 DIAGNOSIS — J101 Influenza due to other identified influenza virus with other respiratory manifestations: Secondary | ICD-10-CM | POA: Insufficient documentation

## 2021-09-20 DIAGNOSIS — R21 Rash and other nonspecific skin eruption: Secondary | ICD-10-CM | POA: Insufficient documentation

## 2021-09-20 LAB — URINALYSIS, ROUTINE W REFLEX MICROSCOPIC
Bilirubin, UA: NEGATIVE
Leukocytes,UA: NEGATIVE
Nitrite, UA: NEGATIVE
Protein,UA: NEGATIVE
RBC, UA: NEGATIVE
Specific Gravity, UA: 1.025 (ref 1.005–1.030)
Urobilinogen, Ur: 1 mg/dL (ref 0.2–1.0)
pH, UA: 5.5 (ref 5.0–7.5)

## 2021-09-20 MED ORDER — DOXYCYCLINE HYCLATE 100 MG PO TABS: 100.0000 mg | ORAL_TABLET | Freq: Two times a day (BID) | ORAL | 0 refills | Status: AC

## 2021-09-20 MED ORDER — TRIAMCINOLONE ACETONIDE 0.1 % EX CREA: 1.0000 "application " | TOPICAL_CREAM | Freq: Two times a day (BID) | CUTANEOUS | 0 refills | Status: DC
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
## 2021-09-20 NOTE — Assessment & Plan Note (Signed)
Acute and improved at this time with no further symptoms.

## 2021-09-20 NOTE — Assessment & Plan Note (Signed)
To right arm, presents like impetigo.  Will send in Doxycycline 100 MG BID x 7 days and cream to place to area for pruritus.  Return in 2 weeks for follow-up.

## 2021-09-20 NOTE — Telephone Encounter (Signed)
Pt does not have transportation and no computer he has no way to do an appt. Her states that he is trying to get into assistant living

## 2021-09-20 NOTE — Assessment & Plan Note (Addendum)
Chronic, ongoing with continued grandiose thought processes present, ?more schizophrenia on presentation -- high suspicion for this.   - Continue current medication regimen and adjust as needed -- may benefit change to Olanzapine in future.  On review old records history of missing doses Risperdal, will continue current medication at this time and adjust as needed -- pill packs have been beneficial for patient. - Continue palliative collaboration.   - Continue collaboration with CCM SW.   - Would benefit from psychiatry, a referral placed last visit and he has not heard from them, check on this.   - Working on assisted living placement for patient, which would offer benefit.   - Labs today CMP, CBC, TSH, ammonia, and urine check

## 2021-09-21 DIAGNOSIS — Z7901 Long term (current) use of anticoagulants: Secondary | ICD-10-CM | POA: Diagnosis not present

## 2021-09-21 DIAGNOSIS — Z7984 Long term (current) use of oral hypoglycemic drugs: Secondary | ICD-10-CM | POA: Diagnosis not present

## 2021-09-21 DIAGNOSIS — Z7951 Long term (current) use of inhaled steroids: Secondary | ICD-10-CM | POA: Diagnosis not present

## 2021-09-21 DIAGNOSIS — Z8673 Personal history of transient ischemic attack (TIA), and cerebral infarction without residual deficits: Secondary | ICD-10-CM | POA: Diagnosis not present

## 2021-09-21 DIAGNOSIS — Z7982 Long term (current) use of aspirin: Secondary | ICD-10-CM | POA: Diagnosis not present

## 2021-09-21 DIAGNOSIS — I509 Heart failure, unspecified: Secondary | ICD-10-CM | POA: Diagnosis not present

## 2021-09-21 DIAGNOSIS — I4891 Unspecified atrial fibrillation: Secondary | ICD-10-CM | POA: Diagnosis not present

## 2021-09-21 DIAGNOSIS — Z9181 History of falling: Secondary | ICD-10-CM | POA: Diagnosis not present

## 2021-09-21 DIAGNOSIS — J45909 Unspecified asthma, uncomplicated: Secondary | ICD-10-CM | POA: Diagnosis not present

## 2021-09-21 LAB — CBC WITH DIFFERENTIAL/PLATELET
Basophils Absolute: 0 10*3/uL (ref 0.0–0.2)
Basos: 0 %
EOS (ABSOLUTE): 0.1 10*3/uL (ref 0.0–0.4)
Eos: 1 %
Hematocrit: 45.5 % (ref 37.5–51.0)
Hemoglobin: 14.9 g/dL (ref 13.0–17.7)
Immature Grans (Abs): 0 10*3/uL (ref 0.0–0.1)
Immature Granulocytes: 0 %
Lymphocytes Absolute: 1.3 10*3/uL (ref 0.7–3.1)
Lymphs: 12 %
MCH: 29 pg (ref 26.6–33.0)
MCHC: 32.7 g/dL (ref 31.5–35.7)
MCV: 89 fL (ref 79–97)
Monocytes Absolute: 0.6 10*3/uL (ref 0.1–0.9)
Monocytes: 6 %
Neutrophils Absolute: 8.3 10*3/uL — ABNORMAL HIGH (ref 1.4–7.0)
Neutrophils: 81 %
Platelets: 310 10*3/uL (ref 150–450)
RBC: 5.14 x10E6/uL (ref 4.14–5.80)
RDW: 14.8 % (ref 11.6–15.4)
WBC: 10.3 10*3/uL (ref 3.4–10.8)

## 2021-09-21 LAB — COMPREHENSIVE METABOLIC PANEL
ALT: 22 IU/L (ref 0–44)
AST: 19 IU/L (ref 0–40)
Albumin/Globulin Ratio: 1.6 (ref 1.2–2.2)
Albumin: 4.5 g/dL (ref 3.8–4.8)
Alkaline Phosphatase: 105 IU/L (ref 44–121)
BUN/Creatinine Ratio: 21 (ref 10–24)
BUN: 21 mg/dL (ref 8–27)
Bilirubin Total: 0.6 mg/dL (ref 0.0–1.2)
CO2: 24 mmol/L (ref 20–29)
Calcium: 9.7 mg/dL (ref 8.6–10.2)
Chloride: 100 mmol/L (ref 96–106)
Creatinine, Ser: 0.98 mg/dL (ref 0.76–1.27)
Globulin, Total: 2.8 g/dL (ref 1.5–4.5)
Glucose: 115 mg/dL — ABNORMAL HIGH (ref 70–99)
Potassium: 4.1 mmol/L (ref 3.5–5.2)
Sodium: 141 mmol/L (ref 134–144)
Total Protein: 7.3 g/dL (ref 6.0–8.5)
eGFR: 85 mL/min/{1.73_m2} (ref >59)

## 2021-09-21 LAB — TSH: TSH: 1.52 u[IU]/mL (ref 0.450–4.500)

## 2021-09-21 LAB — AMMONIA: Ammonia: 50 ug/dL (ref 40–200)

## 2021-09-21 NOTE — Progress Notes (Signed)
Contacted via [place] morning Evan [name], just waiting on one lab to return, ammonia, but current labs are stable and white blood cell count is trending down.  Thyroid is normal.  Kidney function, creatinine and eGFR, remains normal, as is liver function, AST and ALT.  Any questions? Keep being amazing!!  Thank you for allowing me to participate in your care.  I appreciate you. Kindest regards, Araf Clugston

## 2021-09-22 ENCOUNTER — Telehealth: Payer: Self-pay | Admitting: Nurse Practitioner
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
## 2021-09-23 ENCOUNTER — Ambulatory Visit: Payer: Self-pay | Admitting: *Deleted

## 2021-09-23 NOTE — Telephone Encounter (Signed)
°  Chief Complaint: depression Symptoms: depression- patient has situational depression- patient is concerned about insects in apartment  Frequency:   Pertinent Negatives: Patient denies  suicidal thoughts Disposition: [] ED /[] Urgent Care (no appt availability in office) / [] Appointment(In office/virtual)/ []  [facility]/ [] Home Care/ [] Refused Recommended Disposition /[] [facility]/ []  Follow-up with PCP Additional Notes:   Patient has paid all bills and he has no money- he is under stress about money he owes to [employer], no way to get groceries- transportation- but afraid to get groceries due to insects and storage , insects in apartment- needs to move Patient advised will send message to office for review and see if there are any resources for him. Patient is ready to go to assisted living.

## 2021-09-23 NOTE — Telephone Encounter (Signed)
Reason for Disposition  [1] Depression AND [2] NO worsening or change  Answer Assessment - Initial Assessment Questions 1. CONCERN: "What happened that made you call today?"     Cold outside- elbow pain. Patient has roaches in his sink 2. DEPRESSION SYMPTOM SCREENING: "How are you feeling overall?" (e.g., decreased energy, increased sleeping or difficulty sleeping, difficulty concentrating, feelings of sadness, guilt, hopelessness, or worthlessness)     Trouble paying bills- awaiting assisted living- he is afraid he is going to lose everything- his contact 3. RISK OF HARM - SUICIDAL IDEATION:  "Do you ever have thoughts of hurting or killing yourself?"  (e.g., yes, no, no but preoccupation with thoughts about death)   - INTENT:  "Do you have thoughts of hurting or killing yourself right NOW?" (e.g., yes, no, N/A)   - PLAN: "Do you have a specific plan for how you would do this?" (e.g., gun, knife, overdose, no plan, N/A)     No- no plan 4. RISK OF HARM - HOMICIDAL IDEATION:  "Do you ever have thoughts of hurting or killing someone else?"  (e.g., yes, no, no but preoccupation with thoughts about death)   - INTENT:  "Do you have thoughts of hurting or killing someone right NOW?" (e.g., yes, no, N/A)   - PLAN: "Do you have a specific plan for how you would do this?" (e.g., gun, knife, no plan, N/A)      no 5. FUNCTIONAL IMPAIRMENT: "How have things been going for you overall? Have you had more difficulty than usual doing your normal daily activities?"  (e.g., better, same, worse; self-care, school, work, interactions)     Post stroke- patient feels people are trying to evict him 6. SUPPORT: "Who is with you now?" "Who do you live with?" "Do you have family or friends who you can talk to?"      No good support 7. THERAPIST: "Do you have a counselor or therapist? Name?"       8. STRESSORS: "Has there been any new stress or recent changes in your life?"     Patient is afraid of eviction  9. ALCOHOL  USE OR SUBSTANCE USE (DRUG USE): "Do you drink alcohol or use any illegal drugs?"     no 10. OTHER: "Do you have any other physical symptoms right now?" (e.g., fever)         11. PREGNANCY: "Is there any chance you are pregnant?" "When was your last menstrual period?"  Protocols used: Depression-A-AH

## 2021-09-23 NOTE — Patient Instructions (Signed)
Visit Information  Thank you for taking time to visit with me today. Please don't hesitate to contact me if I can be of assistance to you before our next scheduled telephone appointment.  Following are the goals we discussed today:  Patient Goals/Self-Care Activities: Over the next 120 days Attend all scheduled appointments with providers Contact office with any questions or concerns [facility] transportation number to assist with attending medical appointments Practice relaxation or meditation daily Practice positive thinking and self-talk  Our next appointment is by telephone in 1-2 weeks  Please call the care guide team at [phone] if you need to cancel or reschedule your appointment.   If you are experiencing a Mental Health or [place] or need someone to talk to, please call 911   Patient verbalizes understanding of instructions and care plan provided today and agrees to view in [place]. Active MyChart status confirmed with patient.    [name], MSW, [email or web address] Phone [phone] 11:34 AM

## 2021-09-23 NOTE — Telephone Encounter (Signed)
Copied from [place] [id number]. Topic: General - Other >> Sep 23, 2021 11:11 AM Oneta Rack wrote: Patient wanted PCP to be aware he is donating his furniture to hospice because he is going into assisted living. Patient does not want his apt complex to have his stuff

## 2021-09-23 NOTE — Chronic Care Management (AMB) (Signed)
Chronic Care Management    Clinical Social Work Note  09/23/2021 Name: Evan [name] MRN: [id number] DOB: [birth year]  Evan [name] is a 66 y.o. year old male who is a primary care patient of [name], [name], NP. The CCM team was consulted to assist the patient with chronic disease management and/or care coordination needs related to: Mental Health Counseling and Resources.   Engaged with patient by telephone for follow up visit in response to provider referral for social work chronic care management and care coordination services.   Consent to Services:  The patient was given information about Chronic Care Management services, agreed to services, and gave verbal consent prior to initiation of services.  Please see initial visit note for detailed documentation.   Patient agreed to services and consent obtained.   Summary: Patient reports home health services has began. States allegations of disrupting neighbors were made against him; however, he is unsure about assisted living. CCM LCSW continues to collaborate with Medicaid and medical team regarding appropriate patient care needs, including housing.  See Care Plan below for interventions and patient self-care activities.  Recommendation: Patient may benefit from, and is in agreement work with LCSW to address care coordination needs and will continue to work with the clinical team to address health care and disease management related needs.   Follow up Plan: Patient would like continued follow-up from CCM LCSW.  per patient's request will follow up in 1-2 weeks.  Will call office if needed prior to next encounter.  SDOH (Social Determinants of Health) assessments and interventions performed:    Advanced Directives Status: Not addressed in this encounter.  CCM Care Plan  Allergies  Allergen Reactions   Penicillin G Hives   Sulfa Antibiotics Hives   Tiotropium    Zoloft [Sertraline Hcl] Other (See Comments)    Outpatient  Encounter Medications as of 09/14/2021  Medication Sig   aspirin 81 MG chewable tablet Chew 1 tablet (81 mg total) by mouth daily.   Blood Glucose Monitoring Suppl (ONETOUCH VERIO) w/Device KIT Use to check blood sugar 3 to 4 times a day and document.  Please bring to visits for review.   Cholecalciferol 125 MCG (5000 UT) TABS Take 1 tablet (5,000 Units total) by mouth daily.   empagliflozin (JARDIANCE) 10 MG TABS tablet Take 1 tablet (10 mg total) by mouth daily.   ezetimibe (ZETIA) 10 MG tablet Take 1 tablet (10 mg total) by mouth daily.   furosemide (LASIX) 40 MG tablet TAKE 2 TABLETS BY MOUTH ONCE EVERY MORNING AND 1 TABLET ONCE EVERY EVENING   glucose blood (ONETOUCH VERIO) test strip Use to check blood sugar 3 to 4 times a day.   metoprolol succinate (TOPROL XL) 50 MG 24 hr tablet Take 1 tablet (50 mg total) by mouth daily.   nitroGLYCERIN (NITROSTAT) 0.4 MG SL tablet Place 1 tablet (0.4 mg total) under the tongue every 5 (five) minutes as needed for chest pain.   OneTouch Delica Lancets 78G MISC Use to check blood sugar 2-3 times a day.   pantoprazole (PROTONIX) 40 MG tablet Take 1 tablet (40 mg total) by mouth daily.   risperiDONE (RISPERDAL) 0.5 MG tablet Take 1 tablet (0.5 mg total) by mouth 2 (two) times daily.   rivaroxaban (XARELTO) 20 MG TABS tablet Take 1 tablet (20 mg total) by mouth at bedtime.   Saccharomyces boulardii (PROBIOTIC) 250 MG CAPS Take 1 capsule by mouth in the morning and at bedtime.   sharps container  1 each by Does not apply route as needed.   Skin Protectants, Misc. (EUCERIN) cream Apply topically as needed for dry skin.   SYMBICORT 80-4.5 MCG/ACT inhaler INHALE 2 PUFFS BY MOUTH TWICE DAILY   vitamin B-12 (CYANOCOBALAMIN) 1000 MCG tablet Take 1 tablet (1,000 mcg total) by mouth daily.   [DISCONTINUED] albuterol (VENTOLIN HFA) 108 (90 Base) MCG/ACT inhaler Inhale 2 puffs into the lungs every 4 (four) hours as needed for wheezing or shortness of breath. (Patient not  taking: Reported on 09/13/2021)   [DISCONTINUED] Elastic Bandages & Supports (MEDICAL COMPRESSION STOCKINGS) MISC 1 Package by Does not apply route daily. (Patient not taking: Reported on 09/13/2021)   [DISCONTINUED] triamcinolone ointment (KENALOG) 0.1 % Apply 1 application topically 2 (two) times daily. Apply to skin of arms.   No facility-administered encounter medications on file as of 09/14/2021.    Patient Active Problem List   Diagnosis Date Noted   Influenza A 09/20/2021   Rash 09/20/2021   Stress reaction causing mixed disturbance of emotion and conduct 09/18/2021   Memory changes 08/12/2021   Other thrombophilia ([place]) 12/25/2020   Vitamin D deficiency 12/04/2020   Vitamin B12 deficiency 12/04/2020   Stenosis of right carotid artery 12/03/2020   History of CVA (cerebrovascular accident) 11/06/2020   History of 2019 novel coronavirus disease (COVID-19) 09/27/2020   Atherosclerosis of aorta ([place]) 12/04/2019   Persistent atrial fibrillation ([place]) 09/12/2019   CAD (coronary artery disease) 09/12/2019   Chronic venous stasis 09/12/2019   Hoarding disorder 09/12/2019   Cervical spinal stenosis 09/12/2019   Diabetic retinopathy ([place]) 09/12/2019   COPD, mild ([place]) 09/12/2019   Osteoporosis 09/09/2019   History of prostate cancer 09/09/2019   Paranoid type schizophrenia ([place]) 08/10/2019   Obstructive sleep apnea on CPAP 08/10/2019   Hyperlipidemia associated with type 2 diabetes mellitus ([place]) 08/10/2019   Chronic diastolic CHF (congestive heart failure) ([place]) 01/09/2014   Esophageal dysmotility 09/12/2013   Type 2 diabetes mellitus with morbid obesity ([place]) 02/14/2012   Obesity, morbid (more than 100 lbs over ideal weight or BMI > 40) ([place]) 07/26/2011   OLD MYOCARDIAL INFARCTION 03/02/2010   Hypertension associated with diabetes ([place]) 03/20/2009   CORONARY ATHEROSCLEROSIS, ARTERY BYPASS GRAFT 03/20/2009    Conditions to be addressed/monitored: CAD, HTN, COPD, DMII, and  Osteoporosis; Level of care concerns  Care Plan : General Social Work (Adult)  Updates made by Rebekah [place]field, LCSW since 09/23/2021 12:00 AM     Problem: Response to Treatment (Depression)      Long-Range Goal: Response to Treatment Maximized   Start Date: 12/27/2020  This Visit's Progress: On track  Recent Progress: On track  Priority: High  Note:   Current barriers:   Acute Mental Health needs related to Anxiety Housing barriers, Level of care concerns, and Mental Health Concerns  Needs Support, Education, and Care Coordination in order to meet unmet mental health needs. Clinical Goal(s): Over the next 120 days, patient will work with SW to reduce or manage symptoms of anxiety and increase knowledge and/or ability of: coping skills, healthy habits, self-management skills, and stress reduction.until connected for ongoing counseling.  Clinical Interventions:  Assessed patient's previous treatment, needs, coping skills, current treatment, support system and barriers to care Patient interviewed and appropriate assessments performed Provided mental health counseling with regard to managing mental health conditions Patient reports difficulty managing symptoms of anxiety triggered by chronic health conditions Concerns that rent isn't paid for. Concerns for not passing inspection and it needed to be completed  06/23/21 [name] is his leasing agent who contacted patient while CCM LCSW was on phone and shared that she will work with patient on meeting inspection. Scheduled to visit home on Tuesday, 06/28/21 11/28: Patient reports [name] has assisted him with passing the inspection 01/11: CCM LCSW discussed benefits of obtaining a Guardian through DSS to assist with paying rent, utilities, etc. Patient denies need for any resources stating he has no concerns with paying rent 2/1: LCSW collaborated with [employer] to obtain appropriate Assisted Living facilities, after pt agreed to services. Resources was  provided via e-mail. LCSW was unable to provide update to pt, as his phone is not currently working 2/8: Patient reports home health services has began. States allegations of disrupting neighbors were made against him; however, he is unsure about assisted living. CCM LCSW continues to collaborate with Medicaid and medical team regarding appropriate patient care needs, including housing Patient is upset about the way he is treated by multiple personal aids, stating that current aid is dishonest about why she does not show to work. CCM LCSW strongly encouraged patient to contact agency about his concerns to reach a resolution 09/08: [name] is back as his guide for 27 hrs a week [place] [phone] 09/26: Patient informed CCM LCSW that [name] will provide transportation for his upcoming PCP appt scheduled 05/11/21. This information was verified by [name], pt's new aid that will be assisting with ADL's in the home on Mondays, Wednesdays, and Fridays 11/1: Patient reports that he is no longer receiving services from [facility]. He has contacted [name] and is awaiting paperwork regarding home care agencies 11/8: Patient reports inability to work with [name] and her home aids. He has contacted [place] and requested to work with a new agency. Patient reports he has met with a new aid and will have an assessment completed within a week 11/28: Patient is not interested in ALF or SNF. He prefers to continue residing independently. CCM LCSW will follow up with pt regarding process of obtaining new aid through [facility] 11/30: Patient was strongly encouraged to re-consider ALF to provide improved socialization and support 12/22: CCM LCSW addressed reported concerns regarding bank fraud and alleged risks of eviction due to payment confusion. Patient is adamant that he is not interested in leaving current residence. States he feels safe in the home, despite reports of others having access to his home  without his permission 01/11: Patient is satisfied with services provided by [facility]. LCSW informed him of his next scheduled home visit Patient receives visits from neighbors which promotes mood CCM LCSW discussed CBT strategies to assist with managing symptoms  Patient reports recent attempt for bank fraud. The bank restored his account and are sending new cards. Patient identified strategies to prevent future scams, including, locking his belongings in a safe at home, feels previous aids were stealing his info 12/22: CCM LCSW discussed with patient the importance of contacting Law Enforcement if he has concerns for safety/fraud Validation and encouragement was provided during call. CCM LCSW encouraged patient to continue reflecting on past experiences and current friendships to promote positive mood and stress management. States he has enjoyed playing cards online with friends and praying to cope with stressors. 10/10 Patient shared that recent stress has been alleviated with drawing and talking with family and friends  CCM LCSW provided a supportive environment to allow patient to process his emotions Patient reports compliance with medications. Patient utilizes medicine cabinet with labels, AM and PM, to assist  with medication management Patient reports that his strong spiritual relationship keeps him encouraged to move forward instead of focusing on past events 11/1: Patient enjoys walking to assist with stress management Patient reports becoming stir crazy from staying at home for majority of the day. CCM LCSW discussed benefits of participating in [facility]. Patient reports hx of attending [facility]. States that he disenrolled because staff and participants believed he was delusional because he has close relationships with various celebrities. Patient is not interested in initiating psychiatry and/or counseling to assist with strengthening support system 09/08: Patient reports that he participates  in a group weekly to promote socialization with others 12/22: States he feels "liberated" without caregivers because he no longer has things stolen from him CCM LCSW discussed strategies to improve communication skills with aid to assist in preventing misunderstandings or offending others  CCM LCSW reviewed upcoming appointments with patient. He reports that he is utilizing [employer] for transportation. His aid has offered to bring patient to local stores to obtain cleaning supplies. Patient reports that he is aware that he can also utilize [employer] for transportation to medical appointments 10/10: Patient has a ride scheduled for 10/18 to take him grocery shopping 01/11: CCM LCSW completed a referral to Care Guide to assist patient with scheduling transportation to upcoming appt on 09/08/21 CCM LCSW discussed strategies to assist patient with memory concerns. Patient agreed to write notes and post them in high traffic areas of the home to remind him of upcoming appts and/or medications CCM LCSW collaborated with PCP and CCM RN regarding concerns of medication adherence. LCSW informed RNCM of patient's request for a f/up call regarding questions about his cholesterol levels and strategies to manage condition Other interventions: Solution-Focused Strategies, Mindfulness or Relaxation Training, Active listening / Reflection utilized , Emotional Supportive Provided, Psychoeducation for mental health needs , Motivational Interviewing, Participation in counseling encouraged , Participation in support group encouraged , Consideration of in-home help encouraged , and Verbalization of feelings encouraged   Discussed plans with patient for ongoing care management follow up and provided patient with direct contact information for care management team Collaboration with PCP regarding development and update of comprehensive plan of care as evidenced by provider attestation and co-signature Inter-disciplinary care  team collaboration (see longitudinal plan of care) 08/26/21 Patient contacted to discuss options for Assisted Living. Patient confirms continued  desire to move where he can get help with his care needs. Patient states that he is being evicted and has "5"days to move out. However patient states that his landlord will "work:" with him Placement process discussed and the fact that the majority of his income will be going to pay for his stay at the facility "I have to pay rent somewhere" Collaboration phone call to [facility]-patient's local management entity [phone] to discuss assistance with finding placement for patient, patient provided them with verbal consent to assist with his placement needs. This Education officer, museum spoke with Mardene Celeste who place a referral to the Housing Team- they will contact this Education officer, museum for follow up Patient Goals/Self-Care Activities: Over the next 120 days Attend all scheduled appointments with providers Contact office with any questions or concerns Utilize [employer] transportation number to assist with attending medical appointments Practice relaxation or meditation daily Practice positive thinking and self-talk        [name], [email or web address], [email or web address] Phone [phone] 11:32 AM

## 2021-09-24 ENCOUNTER — Emergency Department
Admission: EM | Admit: 2021-09-24 | Discharge: 2021-09-24 | Disposition: A | Discharge status: Home / Self Care | Payer: Medicare Other | Attending: Emergency Medicine | Admitting: Emergency Medicine

## 2021-09-24 ENCOUNTER — Other Ambulatory Visit: Payer: Self-pay

## 2021-09-24 DIAGNOSIS — E119 Type 2 diabetes mellitus without complications: Secondary | ICD-10-CM | POA: Insufficient documentation

## 2021-09-24 DIAGNOSIS — I110 Hypertensive heart disease with heart failure: Secondary | ICD-10-CM | POA: Diagnosis not present

## 2021-09-24 DIAGNOSIS — I509 Heart failure, unspecified: Secondary | ICD-10-CM | POA: Insufficient documentation

## 2021-09-24 DIAGNOSIS — Z743 Need for continuous supervision: Secondary | ICD-10-CM | POA: Diagnosis not present

## 2021-09-24 DIAGNOSIS — M79603 Pain in arm, unspecified: Secondary | ICD-10-CM | POA: Diagnosis not present

## 2021-09-24 DIAGNOSIS — R21 Rash and other nonspecific skin eruption: Secondary | ICD-10-CM | POA: Diagnosis not present

## 2021-09-24 NOTE — ED Notes (Signed)
Pt has calmed down and was willing to walk out. Walked out with other Therapist, sports. Unable to obtain VS at this time. Pt was provided discharge papers.
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
## 2021-09-24 NOTE — ED Provider Notes (Signed)
[facility] Emergency Department Provider Note     Event Date/Time   First MD Initiated Contact with Patient 09/24/21 1152     (approximate)   History   Rash   HPI  Evan [name] is a 66 y.o. male with a history of hypertension, diabetes, CHF, and paranoid schizophrenia, presents to the ED for evaluation of a persistent rash to the right upper extremity.  Patient been evaluated and treated previously in this ED for the same complaint.  He is also been evaluated by his primary provider for the same complaint.  He presents in no acute distress, denies any fevers, chills, or sweats.  According to the chart review, patient saw his primary provider 3 to 4 days prior, he was given a course of doxycycline and triamcinolone cream for the same rash.     Physical Exam   Triage Vital Signs: ED Triage Vitals  Enc Vitals Group     BP 09/24/21 1136 126/74     Pulse Rate 09/24/21 1136 88     Resp 09/24/21 1136 18     Temp 09/24/21 1136 98.8 F (37.1 C)     Temp Source 09/24/21 1136 Oral     SpO2 09/24/21 1136 97 %     Weight 09/24/21 1124 200 lb 9.9 oz (91 kg)     Height 09/24/21 1124 5\' 5"  (1.651 m)     Head Circumference --      Peak Flow --      Pain Score 09/24/21 1124 0     Pain Loc --      Pain Edu? --      Excl. in [place]? --     Most recent vital signs: Vitals:   09/24/21 1136  BP: 126/74  Pulse: 88  Resp: 18  Temp: 98.8 F (37.1 C)  SpO2: 97%    General Awake, no distress.  CV:  Good peripheral perfusion.  RESP:  Normal effort.  ABD:  No distention.  Skin:  Patient with right upper extremity with some excoriations, old areas of bruising, and some dry skin.  No induration, warmth, erythema, hypertrophic skin findings.  No rash appreciated.   ED Results / Procedures / Treatments   Labs (all labs ordered are listed, but only abnormal results are displayed) Labs Reviewed - No data to display   EKG   RADIOLOGY    No results  found.   PROCEDURES:  Critical Care performed: No  Procedures   MEDICATIONS ORDERED IN ED: Medications - No data to display   IMPRESSION / MDM / Jay / ED COURSE  I reviewed the triage vital signs and the nursing notes.                              Differential diagnosis includes, but is not limited to, contact dermatitis, eczema, xeroderma, cellulitis, abscess  Patient with ED evaluation of right extremity rash.  Patient is currently under the care of his PCP for the same, with active prescriptions for doxycycline and triamcinolone limited.  Patient presents in no acute distress without fever, or signs of progressive cellulitis.  Patient's diagnosis is consistent with nonspecific rash with excoriations. Patient is to follow up with his PCP as needed or otherwise scheduled. Patient is given ED precautions to return to the ED for any worsening or new symptoms.    FINAL CLINICAL IMPRESSION(S) / ED DIAGNOSES   Final  diagnoses:  Rash and nonspecific skin eruption     Rx / DC Orders   ED Discharge Orders     None        Note:  This document was prepared using Dragon voice recognition software and may include unintentional dictation errors.    Melvenia Needles, PA-C 09/24/21 1257    Lucrezia Starch, MD 09/24/21 [phone]

## 2021-09-24 NOTE — Discharge Instructions (Signed)
Continue with antibiotics as prescribed and topical cream as ordered.  See your primary provider as scheduled.

## 2021-09-24 NOTE — ED Notes (Signed)
Pt had been provided with snacks earlier, including applesauce. Pt now out in hallway complaining that applesauce gave him heartburn and "you know I have heartburn". Told pt will come talk to him soon.

## 2021-09-24 NOTE — ED Triage Notes (Signed)
Pt comes ems from home with right elbow rash. States he was "killing bugs that can't be killed" and doesn't feel safe at home because something "yucky" is going on in his home. Has hx of paranoia. Thinks his landlord is out to get him. Scratches present on right elbow from picking skin.

## 2021-09-24 NOTE — ED Notes (Addendum)
First Nurse Note: Pt via EMS from home. Pt c/o rash on L elbow, also expressed to EMS that he did not feel safe at home. VSS. Pt is A&Ox4 and NAD.

## 2021-09-26 ENCOUNTER — Telehealth: Payer: Self-pay | Admitting: Nurse Practitioner

## 2021-09-26 NOTE — Telephone Encounter (Signed)
States he woke up seeing over a 100 german cockroaches.No other symptoms. Patient states he is still taking his medications and states he is not going back to the hospital because he has no transportation and the hospital promised transportation and he had to call his friend to come get him but he is home safe and sound. Evan [name] (friend) is going to come pick him up today and take him grocery shopping, he wants to shop healthy.   Patient would like to know the status of [facility] PT orders and would like to know the #   Patient states elbow is in pain due to stroke.   He knows "[name]"  the musician and the hospital tried to put him in a padded room but he denied it.

## 2021-09-26 NOTE — Telephone Encounter (Signed)
Copied from [place] [phone]. Topic: General - Other >> Sep 26, 2021  2:28 PM Tessa Lerner A wrote: Reason for CRM: The patient has called requesting to speak with a Education officer, museum when possible  The patient has received additional documents related to their living situation and would like to discuss them when possible  Please contact further when available

## 2021-09-27 ENCOUNTER — Telehealth: Payer: Self-pay | Admitting: Nurse Practitioner

## 2021-09-27 DIAGNOSIS — Z7951 Long term (current) use of inhaled steroids: Secondary | ICD-10-CM | POA: Diagnosis not present

## 2021-09-27 DIAGNOSIS — Z7984 Long term (current) use of oral hypoglycemic drugs: Secondary | ICD-10-CM | POA: Diagnosis not present

## 2021-09-27 DIAGNOSIS — Z7901 Long term (current) use of anticoagulants: Secondary | ICD-10-CM | POA: Diagnosis not present

## 2021-09-27 DIAGNOSIS — I509 Heart failure, unspecified: Secondary | ICD-10-CM | POA: Diagnosis not present

## 2021-09-27 DIAGNOSIS — Z8673 Personal history of transient ischemic attack (TIA), and cerebral infarction without residual deficits: Secondary | ICD-10-CM | POA: Diagnosis not present

## 2021-09-27 DIAGNOSIS — I4891 Unspecified atrial fibrillation: Secondary | ICD-10-CM | POA: Diagnosis not present

## 2021-09-27 DIAGNOSIS — Z9181 History of falling: Secondary | ICD-10-CM | POA: Diagnosis not present

## 2021-09-27 DIAGNOSIS — Z7982 Long term (current) use of aspirin: Secondary | ICD-10-CM | POA: Diagnosis not present

## 2021-09-27 DIAGNOSIS — J45909 Unspecified asthma, uncomplicated: Secondary | ICD-10-CM | POA: Diagnosis not present

## 2021-09-28 ENCOUNTER — Telehealth: Payer: Self-pay

## 2021-09-28 ENCOUNTER — Encounter: Payer: Self-pay | Admitting: *Deleted

## 2021-09-28 ENCOUNTER — Telehealth: Payer: Self-pay | Admitting: Nurse Practitioner

## 2021-09-28 ENCOUNTER — Telehealth: Payer: Self-pay | Admitting: *Deleted

## 2021-09-28 DIAGNOSIS — Z7951 Long term (current) use of inhaled steroids: Secondary | ICD-10-CM | POA: Diagnosis not present

## 2021-09-28 DIAGNOSIS — J45909 Unspecified asthma, uncomplicated: Secondary | ICD-10-CM | POA: Diagnosis not present

## 2021-09-28 DIAGNOSIS — Z7982 Long term (current) use of aspirin: Secondary | ICD-10-CM | POA: Diagnosis not present

## 2021-09-28 DIAGNOSIS — I509 Heart failure, unspecified: Secondary | ICD-10-CM | POA: Diagnosis not present

## 2021-09-28 DIAGNOSIS — I4891 Unspecified atrial fibrillation: Secondary | ICD-10-CM | POA: Diagnosis not present

## 2021-09-28 DIAGNOSIS — Z8673 Personal history of transient ischemic attack (TIA), and cerebral infarction without residual deficits: Secondary | ICD-10-CM | POA: Diagnosis not present

## 2021-09-28 DIAGNOSIS — Z9181 History of falling: Secondary | ICD-10-CM | POA: Diagnosis not present

## 2021-09-28 DIAGNOSIS — Z7984 Long term (current) use of oral hypoglycemic drugs: Secondary | ICD-10-CM | POA: Diagnosis not present

## 2021-09-28 DIAGNOSIS — Z7901 Long term (current) use of anticoagulants: Secondary | ICD-10-CM | POA: Diagnosis not present

## 2021-09-28 NOTE — Telephone Encounter (Signed)
5 pm.  Phone call made to patient to remind him of visit scheduled with Palliative Care NP tomorrow afternoon.

## 2021-09-28 NOTE — Telephone Encounter (Signed)
Transition Care Management Unsuccessful Follow-up Telephone Call  Date of discharge and from where:  [facility]  Attempts:  1st Attempt  Reason for unsuccessful TCM follow-up call:  Left voice message

## 2021-09-28 NOTE — Telephone Encounter (Signed)
Patient calling to report his feet are bleeding and peeling per agent [place]. Patient reports his feet are no longer bleeding but peeling off in chunks. Patient reports he has been taking antibiotics for his feet and skin is peeling off . Pt can walk but limited distance. Reports money order of $598 needed from [employer] and he is unable to get transportation to get money order. Requesting a call to his landlord Algis Liming [phone] ext [phone] regarding his issues. Office has addressed possibilities with patient and patient continues to request assistance. Reporting social services has hung up on landlord when attempting to help pt. Patient is very anxious and concerned he will have another stroke. Denies symptoms at this time. Recommended patient elevate feel, relax and allow time to find a resource if office is able and will need to call back tomorrow. Multiple times to redirect patient he is agreeable. Please advise. Patient requesting a call back.

## 2021-09-28 NOTE — Telephone Encounter (Signed)
Stephanie from [facility] called and stated that the Pt is being discharged from OT/ and she stated that he probably needs a psychiatric work up or referral for psychiatric counseling   She says some days he gets upset and nervous/ he does things like take shower with his clothes on, etc..Marland Kitchen
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
## 2021-09-28 NOTE — Telephone Encounter (Signed)
This encounter was created in error - please disregard.

## 2021-09-28 NOTE — Telephone Encounter (Signed)
Spoke with pt for over 30 minutes about needing a money order to pay for clerk of court fees. Pt states he can't get one without transportation. I told pt that [facility] can possibly take him to the store to get one and he states that he cant do that by himself. Unsure of what to do for pt.

## 2021-09-28 NOTE — Telephone Encounter (Signed)
[name] is patient's landlord from [employer]. [name] is calling stating patient is under an eviction notice and she was speaking with the patient in regards of trying to reach out to patient's case worker and he did not remember. The only person he could remember and think of was our office and she would like to speak with someone regarding the patient, that would be familiar with his current situation. Did inform [name] that our office manager Alwyn Ren was out of the office for the afternoon and would be back in the morning. [name] left her contact information for someone to reach out and speak with her regarding the patient. Her contact information is [phone] ext. [place]

## 2021-09-28 NOTE — Telephone Encounter (Signed)
Fraser Din called in says landlord wont accept checks has to have money order. Please call back for assistance

## 2021-09-29 ENCOUNTER — Ambulatory Visit: Payer: Self-pay | Admitting: *Deleted

## 2021-09-29 ENCOUNTER — Ambulatory Visit: Payer: Self-pay | Admitting: Licensed Clinical Social Worker

## 2021-09-29 ENCOUNTER — Other Ambulatory Visit: Payer: Self-pay

## 2021-09-29 ENCOUNTER — Other Ambulatory Visit: Payer: Medicare Other | Admitting: Primary Care

## 2021-09-29 DIAGNOSIS — E11319 Type 2 diabetes mellitus with unspecified diabetic retinopathy without macular edema: Secondary | ICD-10-CM | POA: Diagnosis not present

## 2021-09-29 DIAGNOSIS — E1159 Type 2 diabetes mellitus with other circulatory complications: Secondary | ICD-10-CM

## 2021-09-29 DIAGNOSIS — F423 Hoarding disorder: Secondary | ICD-10-CM

## 2021-09-29 DIAGNOSIS — F430 Acute stress reaction: Secondary | ICD-10-CM

## 2021-09-29 DIAGNOSIS — F200 Paranoid schizophrenia: Secondary | ICD-10-CM

## 2021-09-29 DIAGNOSIS — M810 Age-related osteoporosis without current pathological fracture: Secondary | ICD-10-CM

## 2021-09-29 DIAGNOSIS — I5032 Chronic diastolic (congestive) heart failure: Secondary | ICD-10-CM

## 2021-09-29 DIAGNOSIS — R413 Other amnesia: Secondary | ICD-10-CM

## 2021-09-29 DIAGNOSIS — I152 Hypertension secondary to endocrine disorders: Secondary | ICD-10-CM

## 2021-09-29 DIAGNOSIS — E1169 Type 2 diabetes mellitus with other specified complication: Secondary | ICD-10-CM

## 2021-09-29 DIAGNOSIS — Z8673 Personal history of transient ischemic attack (TIA), and cerebral infarction without residual deficits: Secondary | ICD-10-CM | POA: Diagnosis not present

## 2021-09-29 NOTE — Telephone Encounter (Signed)
Returned call to [facility] as requested. [name] advised that she has already spoken with [place], LCSW but thanked me for returning her call.

## 2021-09-29 NOTE — Patient Instructions (Signed)
Visit Information  Thank you for taking time to visit with me today. Please don't hesitate to contact me if I can be of assistance to you before our next scheduled telephone appointment.  Please call the care guide team at [phone] if you need to cancel or reschedule your appointment.   If you are experiencing a Mental Health or [place] or need someone to talk to, please call the Suicide and Crisis Lifeline: 988 call the [place] National Suicide Prevention Lifeline: (224) 433-3844 or TTY: (260)619-6318 TTY 508 457 8672) to talk to a trained counselor call 911    The care management team will reach out to the patient again over the next 7 days.   [name], MSW, [email or web address] Phone [phone] 5:19 PM

## 2021-09-29 NOTE — Telephone Encounter (Signed)
Referral reopened and patient paperwork mailed and emailed - pending

## 2021-09-29 NOTE — Telephone Encounter (Signed)
Copied from [place]. Topic: General - Other >> Sep 29, 2021  9:12 AM Leward Quan A wrote: Reason for CRM: Patient called in stated that Alwyn Ren have not called him. Then he stated that the Hospice came to his house but his things he own are junk and they could not take any of it. Patient stated that he is ok but needed to speak to Ms Alwyn Ren. He was informed that she is in a meeting but will call him back.

## 2021-09-29 NOTE — Chronic Care Management (AMB) (Signed)
Care Management  Collaboration  Note  09/29/2021 Name: Evan [name] MRN: [id number] DOB: 29-Sep-1955  Evan [name] is a 66 y.o. year old male who is a primary care patient of [name], [name], NP. The CCM team was consulted reference care coordination needs for [employer] , Level of Care Concerns, and Mental Health Counseling and Resources.  Assessment: Patient was not interviewed or contacted during this encounter. See Care Plan or interventions for patient self-care actives.   Intervention:Conducted brief assessment, recommendations and relevant information discussed.  CCM LCSW collaborated with [employer], Air cabin crew, Adult Scientist, forensic, [place], [place] facilities, and medical team  to assist with meeting patient's needs.    Follow up Plan: CCM LCSW will continue to collaborate with the above stakeholders in order to meet patient's needs.    Review of patient past medical history, allergies, medications, and health status, including review of pertinent consultant reports was performed as part of comprehensive evaluation and provision of care management/care coordination services.   Care Plan Conditions to be addressed/monitored per PCP order: DM 2, Schizophrenia, CHF, Limited social support, Housing barriers, Level of care concerns, and Mental Health Concerns    Care Plan : General Social Work (Adult)  Updates made by Rebekah Chesterfield, LCSW since 09/29/2021 12:00 AM     Problem: Response to Treatment (Depression)      Long-Range Goal: Response to Treatment Maximized   Start Date: 12/27/2020  This Visit's Progress: On track  Recent Progress: On track  Priority: High  Note:   Current barriers:   Acute Mental Health needs related to Anxiety Housing barriers, Level of care concerns, and Mental Health Concerns  Needs Support, Education, and Care Coordination in order to meet unmet mental health needs. Clinical Goal(s): Over the  next 120 days, patient will work with SW to reduce or manage symptoms of anxiety and increase knowledge and/or ability of: coping skills, healthy habits, self-management skills, and stress reduction.until connected for ongoing counseling.  Clinical Interventions:  Assessed patient's previous treatment, needs, coping skills, current treatment, support system and barriers to care Provided mental health counseling with regard to managing mental health conditions Patient reports difficulty managing chronic health conditions and psychosocial stressors Concerns that rent isn't paid for. Concerns for not passing inspection and it needed to be completed 06/23/21 [name] is his leasing agent who contacted patient while CCM LCSW was on phone and shared that she will work with patient on meeting inspection. Scheduled to visit home on Tuesday, 06/28/21 11/28: Patient reports [name] has assisted him with passing the inspection 01/11: CCM LCSW discussed benefits of obtaining a Guardian through DSS to assist with paying rent, utilities, etc. Patient denies need for any resources stating he has no concerns with paying rent 2/1: LCSW collaborated with [employer] to obtain appropriate Assisted Living facilities, after pt agreed to services. Resources was provided via e-mail. LCSW was unable to provide update to pt, as his phone is not currently working 2/8: Patient reports home health services has began. States allegations of disrupting neighbors were made against him; however, he is unsure about assisted living. CCM LCSW continues to collaborate with Medicaid and medical team regarding appropriate patient care needs, including housing 2/23: LCSW was provided current eviction notice with upcoming court hearing scheduled for 10/05/21. LCSW has spoken with pt's Landlord and was provided time-frame of when patient would have to be out of the home, should Housing Authority's case is awarded Scientist, research (physical sciences) Aid referral  attempted to assist with  upcoming court hearing. Patient has to call independently Patient is upset about the way he is treated by multiple personal aids, stating that current aid is dishonest about why she does not show to work. CCM LCSW strongly encouraged patient to contact agency about his concerns to reach a resolution 09/08: [name] is back as his guide for 27 hrs a week [place] [phone] 09/26: Patient informed CCM LCSW that [name] will provide transportation for his upcoming PCP appt scheduled 05/11/21. This information was verified by [name], pt's new aid that will be assisting with ADL's in the home on Mondays, Wednesdays, and Fridays 11/1: Patient reports that he is no longer receiving services from [facility]. He has contacted [name] and is awaiting paperwork regarding home care agencies 11/8: Patient reports inability to work with [name] and her home aids. He has contacted [place] and requested to work with a new agency. Patient reports he has met with a new aid and will have an assessment completed within a week 11/28: Patient is not interested in ALF or SNF. He prefers to continue residing independently. CCM LCSW will follow up with pt regarding process of obtaining new aid through [facility] 11/30: Patient was strongly encouraged to re-consider ALF to provide improved socialization and support 12/22: CCM LCSW addressed reported concerns regarding bank fraud and alleged risks of eviction due to payment confusion. Patient is adamant that he is not interested in leaving current residence. States he feels safe in the home, despite reports of others having access to his home without his permission 01/11: Patient is satisfied with services provided by Palliative Care. LCSW informed him of his next scheduled home visit 2/23: LCSW will continue to search for safe and appropriate adult care homes for patient, due to multiple concerns for pt's safety and ability to independently care for self. LCSW will  contact DSS regarding application for State/County Special Assistance Program Patient receives visits from neighbors which promotes mood CCM LCSW discussed CBT strategies to assist with managing symptoms  Patient reports recent attempt for bank fraud. The bank restored his account and are sending new cards. Patient identified strategies to prevent future scams, including, locking his belongings in a safe at home, feels previous aids were stealing his info 12/22: CCM LCSW discussed with patient the importance of contacting Law Enforcement if he has concerns for safety/fraud Validation and encouragement was provided during call. CCM LCSW encouraged patient to continue reflecting on past experiences and current friendships to promote positive mood and stress management. States he has enjoyed playing cards online with friends and praying to cope with stressors. 10/10 Patient shared that recent stress has been alleviated with drawing and talking with family and friends  CCM LCSW provided a supportive environment to allow patient to process his emotions Patient reports compliance with medications. Patient utilizes medicine cabinet with labels, AM and PM, to assist with medication management Patient reports that his strong spiritual relationship keeps him encouraged to move forward instead of focusing on past events 11/1: Patient enjoys walking to assist with stress management Patient reports becoming stir crazy from staying at home for majority of the day. CCM LCSW discussed benefits of participating in [facility]. Patient reports hx of attending [facility]. States that he disenrolled because staff and participants believed he was delusional because he has close relationships with various celebrities. Patient is not interested in initiating psychiatry and/or counseling to assist with strengthening support system 09/08: Patient reports that he participates in a  group weekly to promote socialization with others 12/22:  States he feels "liberated" without caregivers because he no longer has things stolen from him 2/23: LCSW completed an APS report log# [id number]  CCM LCSW discussed strategies to improve communication skills with aid to assist in preventing misunderstandings or offending others  CCM LCSW reviewed upcoming appointments with patient. He reports that he is utilizing [facility] for transportation. His aid has offered to bring patient to local stores to obtain cleaning supplies. Patient reports that he is aware that he can also utilize [employer] for transportation to medical appointments 10/10: Patient has a ride scheduled for 10/18 to take him grocery shopping 01/11: CCM LCSW completed a referral to Care Guide to assist patient with scheduling transportation to upcoming appt on 09/08/21 CCM LCSW discussed strategies to assist patient with memory concerns. Patient agreed to write notes and post them in high traffic areas of the home to remind him of upcoming appts and/or medications CCM LCSW collaborated with PCP, Engineer, building services, CCM RN, and ECM/HRMM Supervisor regarding patient care Other interventions: Solution-Focused Strategies, Mindfulness or Psychologist, educational, Active listening / Reflection utilized , Emotional Supportive Provided, Psychoeducation for mental health needs , Motivational Interviewing, Participation in counseling encouraged , Participation in support group encouraged , Consideration of in-home help encouraged , and Verbalization of feelings encouraged   Discussed plans with patient for ongoing care management follow up and provided patient with direct contact information for care management team Collaboration with PCP regarding development and update of comprehensive plan of care as evidenced by provider attestation and co-signature Inter-disciplinary care team collaboration (see longitudinal plan of care) 08/26/21 Patient contacted to discuss options for Assisted Living. Patient  confirms continued  desire to move where he can get help with his care needs. Patient states that he is being evicted and has "5"days to move out. However patient states that his landlord will "work:" with him Placement process discussed and the fact that the majority of his income will be going to pay for his stay at the facility "I have to pay rent somewhere" Collaboration phone call to [facility]-patient's local management entity [phone] to discuss assistance with finding placement for patient, patient provided them with verbal consent to assist with his placement needs. This social worker spoke with Mardene Celeste who place a referral to the Housing Team- they will contact this Education officer, museum for follow up 2/23: Follow up call completed and LCSW was provided with updated listing of local Adult Care Homes Patient Goals/Self-Care Activities: Over the next 120 days Attend all scheduled appointments with providers Contact office with any questions or concerns Utilize [facility] transportation number to assist with attending medical appointments Practice relaxation or meditation daily Practice positive thinking and self-talk      [name], MSW, [email or web address] Phone [phone] 5:17 PM

## 2021-09-29 NOTE — Telephone Encounter (Signed)
Noted, SW with CCM involved, working on placement options.
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
## 2021-09-29 NOTE — Telephone Encounter (Signed)
°  Chief Complaint: Dizziness Symptoms: None presently Frequency: Last night Pertinent Negatives: Patient denies dizziness presently. "It's gone" Disposition: [] ED /[] Urgent Care (no appt availability in office) / [] Appointment(In office/virtual)/ []  [facility]/ [] Home Care/ [] Refused Recommended Disposition /[] [facility]/ [x]  Follow-up with PCP Additional Notes: Declines appt, declines ED. No dizziness presently. Speaking of stress causing vertigo, sheriffs out to home as he called 911.      Reason for Disposition  [1] MILD dizziness (e.g., walking normally) AND [2] has been evaluated by physician for this  Answer Assessment - Initial Assessment Questions 1. DESCRIPTION: "Describe your dizziness."     Spinning 2. LIGHTHEADED: "Do you feel lightheaded?" (e.g., somewhat faint, woozy, weak upon standing)     floaty 3. VERTIGO: "Do you feel like either you or the room is spinning or tilting?" (i.e. vertigo)     yes 4. SEVERITY: "How bad is it?"  "Do you feel like you are going to faint?" "Can you stand and walk?"   - MILD: Feels slightly dizzy, but walking normally.   - MODERATE: Feels unsteady when walking, but not falling; interferes with normal activities (e.g., school, work).   - SEVERE: Unable to walk without falling, or requires assistance to walk without falling; feels like passing out now.      None presently 5. ONSET:  "When did the dizziness begin?"     Last night 6. AGGRAVATING FACTORS: "Does anything make it worse?" (e.g., standing, change in head position)      7. HEART RATE: "Can you tell me your heart rate?" "How many beats in 15 seconds?"  (Note: not all patients can do this)        8. CAUSE: "What do you think is causing the dizziness?"     "Vertigo" 9. RECURRENT SYMPTOM: "Have you had dizziness before?" If Yes, ask: "When was the last time?" "What happened that time?"     Yes 10. OTHER SYMPTOMS: "Do you have any other symptoms?" (e.g., fever,  chest pain, vomiting, diarrhea, bleeding)      No  Protocols used: Dizziness - Lightheadedness-A-AH

## 2021-09-30 ENCOUNTER — Encounter: Payer: Self-pay | Admitting: Nurse Practitioner

## 2021-09-30 ENCOUNTER — Telehealth: Payer: Self-pay | Admitting: Nurse Practitioner

## 2021-09-30 ENCOUNTER — Ambulatory Visit: Payer: Self-pay | Admitting: Licensed Clinical Social Worker

## 2021-09-30 DIAGNOSIS — Z8673 Personal history of transient ischemic attack (TIA), and cerebral infarction without residual deficits: Secondary | ICD-10-CM | POA: Diagnosis not present

## 2021-09-30 DIAGNOSIS — E1159 Type 2 diabetes mellitus with other circulatory complications: Secondary | ICD-10-CM

## 2021-09-30 DIAGNOSIS — Z7951 Long term (current) use of inhaled steroids: Secondary | ICD-10-CM | POA: Diagnosis not present

## 2021-09-30 DIAGNOSIS — J449 Chronic obstructive pulmonary disease, unspecified: Secondary | ICD-10-CM

## 2021-09-30 DIAGNOSIS — I152 Hypertension secondary to endocrine disorders: Secondary | ICD-10-CM

## 2021-09-30 DIAGNOSIS — Z7984 Long term (current) use of oral hypoglycemic drugs: Secondary | ICD-10-CM | POA: Diagnosis not present

## 2021-09-30 DIAGNOSIS — F200 Paranoid schizophrenia: Secondary | ICD-10-CM

## 2021-09-30 DIAGNOSIS — E1169 Type 2 diabetes mellitus with other specified complication: Secondary | ICD-10-CM

## 2021-09-30 DIAGNOSIS — I5032 Chronic diastolic (congestive) heart failure: Secondary | ICD-10-CM

## 2021-09-30 DIAGNOSIS — M810 Age-related osteoporosis without current pathological fracture: Secondary | ICD-10-CM

## 2021-09-30 DIAGNOSIS — I509 Heart failure, unspecified: Secondary | ICD-10-CM | POA: Diagnosis not present

## 2021-09-30 DIAGNOSIS — Z7982 Long term (current) use of aspirin: Secondary | ICD-10-CM | POA: Diagnosis not present

## 2021-09-30 DIAGNOSIS — J45909 Unspecified asthma, uncomplicated: Secondary | ICD-10-CM | POA: Diagnosis not present

## 2021-09-30 DIAGNOSIS — Z9181 History of falling: Secondary | ICD-10-CM | POA: Diagnosis not present

## 2021-09-30 DIAGNOSIS — Z7901 Long term (current) use of anticoagulants: Secondary | ICD-10-CM | POA: Diagnosis not present

## 2021-09-30 DIAGNOSIS — I4891 Unspecified atrial fibrillation: Secondary | ICD-10-CM | POA: Diagnosis not present

## 2021-09-30 NOTE — Chronic Care Management (AMB) (Signed)
Chronic Care Management    Clinical Social Work Note  09/30/2021 Name: Evan [name] MRN: [id number] DOB: [birth year]  Evan [name] is a 66 y.o. year old male who is a primary care patient of [name], Evan Faster, NP. The CCM team was consulted to assist the patient with chronic disease management and/or care coordination needs related to: Level of Care Concerns.   Engaged with patient by telephone for follow up visit in response to provider referral for social work chronic care management and care coordination services.   Consent to Services:  The patient was given information about Chronic Care Management services, agreed to services, and gave verbal consent prior to initiation of services.  Please see initial visit note for detailed documentation.   Patient agreed to services and consent obtained.   Summary: LCSW spoke with Ms. Dorita Sciara with [facility] [phone] LCSW will securely email FL2 for consideration of placement Patient will call Legal Aid today to obtain lawyer for upcoming court hearing.  See Care Plan below for interventions and patient self-care actives.  Recommendation: Patient may benefit from, and is in agreement work with LCSW to address care coordination needs and will continue to work with the clinical team to address health care and disease management related needs.   Follow up Plan: CCM LCSW will continue to collaborate with team in order to meet patient's needs .  SDOH (Social Determinants of Health) assessments and interventions performed:    Advanced Directives Status: Not addressed in this encounter.  CCM Care Plan  Allergies  Allergen Reactions   Penicillin G Hives   Sulfa Antibiotics Hives   Tiotropium    Zoloft [Sertraline Hcl] Other (See Comments)    Outpatient Encounter Medications as of 09/30/2021  Medication Sig   aspirin 81 MG chewable tablet Chew 1 tablet (81 mg total) by mouth daily.   Blood Glucose Monitoring Suppl  (ONETOUCH VERIO) w/Device KIT Use to check blood sugar 3 to 4 times a day and document.  Please bring to visits for review.   Cholecalciferol 125 MCG (5000 UT) TABS Take 1 tablet (5,000 Units total) by mouth daily.   empagliflozin (JARDIANCE) 10 MG TABS tablet Take 1 tablet (10 mg total) by mouth daily.   ezetimibe (ZETIA) 10 MG tablet Take 1 tablet (10 mg total) by mouth daily.   furosemide (LASIX) 40 MG tablet TAKE 2 TABLETS BY MOUTH ONCE EVERY MORNING AND 1 TABLET ONCE EVERY EVENING   glucose blood (ONETOUCH VERIO) test strip Use to check blood sugar 3 to 4 times a day.   metoprolol succinate (TOPROL XL) 50 MG 24 hr tablet Take 1 tablet (50 mg total) by mouth daily.   nitroGLYCERIN (NITROSTAT) 0.4 MG SL tablet Place 1 tablet (0.4 mg total) under the tongue every 5 (five) minutes as needed for chest pain.   OneTouch Delica Lancets 29W MISC Use to check blood sugar 2-3 times a day.   pantoprazole (PROTONIX) 40 MG tablet Take 1 tablet (40 mg total) by mouth daily.   risperiDONE (RISPERDAL) 0.5 MG tablet Take 1 tablet (0.5 mg total) by mouth 2 (two) times daily.   rivaroxaban (XARELTO) 20 MG TABS tablet Take 1 tablet (20 mg total) by mouth at bedtime.   Saccharomyces boulardii (PROBIOTIC) 250 MG CAPS Take 1 capsule by mouth in the morning and at bedtime.   sharps container 1 each by Does not apply route as needed.   Skin Protectants, Misc. (EUCERIN) cream Apply topically as  needed for dry skin.   SYMBICORT 80-4.5 MCG/ACT inhaler INHALE 2 PUFFS BY MOUTH TWICE DAILY   triamcinolone cream (KENALOG) 0.1 % Apply 1 application topically 2 (two) times daily.   vitamin B-12 (CYANOCOBALAMIN) 1000 MCG tablet Take 1 tablet (1,000 mcg total) by mouth daily.   No facility-administered encounter medications on file as of 09/30/2021.    Patient Active Problem List   Diagnosis Date Noted   Influenza A 09/20/2021   Rash 09/20/2021   Stress reaction causing mixed disturbance of emotion and conduct 09/18/2021    Memory changes 08/12/2021   Other thrombophilia ([place]) 12/25/2020   Vitamin D deficiency 12/04/2020   Vitamin B12 deficiency 12/04/2020   Stenosis of right carotid artery 12/03/2020   History of CVA (cerebrovascular accident) 11/06/2020   History of 2019 novel coronavirus disease (COVID-19) 09/27/2020   Atherosclerosis of aorta ([place]) 12/04/2019   Persistent atrial fibrillation ([place]) 09/12/2019   CAD (coronary artery disease) 09/12/2019   Chronic venous stasis 09/12/2019   Hoarding disorder 09/12/2019   Cervical spinal stenosis 09/12/2019   Diabetic retinopathy ([place]) 09/12/2019   COPD, mild ([place]) 09/12/2019   Osteoporosis 09/09/2019   History of prostate cancer 09/09/2019   Paranoid type schizophrenia ([place]) 08/10/2019   Obstructive sleep apnea on CPAP 08/10/2019   Hyperlipidemia associated with type 2 diabetes mellitus (HCC) 08/10/2019   Chronic diastolic CHF (congestive heart failure) ([place]) 01/09/2014   Esophageal dysmotility 09/12/2013   Type 2 diabetes mellitus with morbid obesity ([place]) 02/14/2012   Obesity, morbid (more than 100 lbs over ideal weight or BMI > 40) ([place]) 07/26/2011   OLD MYOCARDIAL INFARCTION 03/02/2010   Hypertension associated with diabetes ([place]) 03/20/2009   CORONARY ATHEROSCLEROSIS, ARTERY BYPASS GRAFT 03/20/2009    Conditions to be addressed/monitored: CHF, CAD, HTN, COPD, DMII, and Osteoporosis Schizophrenia; ADL IADL limitations, Mental Health Concerns , Memory Deficits, and Inability to perform ADL's independently  Care Plan : General Social Work (Adult)  Updates made by [name] D, LCSW since 09/30/2021 12:00 AM     Problem: Response to Treatment (Depression)      Long-Range Goal: Response to Treatment Maximized   Start Date: 12/27/2020  This Visit's Progress: On track  Recent Progress: On track  Priority: High  Note:   Current barriers:   Acute Mental Health needs related to Anxiety Housing barriers, Level of care concerns, and Mental  Health Concerns  Needs Support, Education, and Care Coordination in order to meet unmet mental health needs. Clinical Goal(s): Over the next 120 days, patient will work with SW to reduce or manage symptoms of anxiety and increase knowledge and/or ability of: coping skills, healthy habits, self-management skills, and stress reduction.until connected for ongoing counseling.  Clinical Interventions:  Assessed patient's previous treatment, needs, coping skills, current treatment, support system and barriers to care Provided mental health counseling with regard to managing mental health conditions Patient reports difficulty managing chronic health conditions and psychosocial stressors Concerns that rent isn't paid for. Concerns for not passing inspection and it needed to be completed 06/23/21 [name] is his leasing agent who contacted patient while CCM LCSW was on phone and shared that she will work with patient on meeting inspection. Scheduled to visit home on Tuesday, 06/28/21 11/28: Patient reports [name] has assisted him with passing the inspection 01/11: CCM LCSW discussed benefits of obtaining a Guardian through DSS to assist with paying rent, utilities, etc. Patient denies need for any resources stating he has no concerns with paying rent 2/1: LCSW collaborated with  [place] to obtain appropriate Assisted Living facilities, after pt agreed to services. Resources was provided via e-mail. LCSW was unable to provide update to pt, as his phone is not currently working 2/8: Patient reports home health services has began. States allegations of disrupting neighbors were made against him; however, he is unsure about assisted living. CCM LCSW continues to collaborate with Medicaid and medical team regarding appropriate patient care needs, including housing 2/23: LCSW was provided current eviction notice with upcoming court hearing scheduled for 10/05/21. LCSW has spoken with pt's Landlord and was provided time-frame of  when patient would have to be out of the home, should Housing Authority's case is awarded Scientist, research (physical sciences) Aid referral attempted to assist with upcoming court hearing. LCSW provided patient with contact information and he agreed to call independently Patient is upset about the way he is treated by multiple personal aids, stating that current aid is dishonest about why she does not show to work. CCM LCSW strongly encouraged patient to contact agency about his concerns to reach a resolution 09/08: [name] is back as his guide for 27 hrs a week [place] [phone] 09/26: Patient informed CCM LCSW that [name] will provide transportation for his upcoming PCP appt scheduled 05/11/21. This information was verified by [name], pt's new aid that will be assisting with ADL's in the home on Mondays, Wednesdays, and Fridays 11/1: Patient reports that he is no longer receiving services from [facility]. He has contacted [name] and is awaiting paperwork regarding home care agencies 11/8: Patient reports inability to work with [name] and her home aids. He has contacted [facility] and requested to work with a new agency. Patient reports he has met with a new aid and will have an assessment completed within a week 11/28: Patient is not interested in ALF or SNF. He prefers to continue residing independently. CCM LCSW will follow up with pt regarding process of obtaining new aid through [facility] 11/30: Patient was strongly encouraged to re-consider ALF to provide improved socialization and support 12/22: CCM LCSW addressed reported concerns regarding bank fraud and alleged risks of eviction due to payment confusion. Patient is adamant that he is not interested in leaving current residence. States he feels safe in the home, despite reports of others having access to his home without his permission 01/11: Patient is satisfied with services provided by [facility]. LCSW informed him of his next scheduled home visit 2/23:  LCSW will continue to search for safe and appropriate adult care homes for patient, due to multiple concerns for pt's safety and ability to independently care for self. LCSW will contact DSS regarding application for State/County Special Assistance Program 2/24: LCSW spoke with Ms. Dorita Sciara with [facility] [phone] LCSW will securely email FL2 for consideration of placement Patient receives visits from neighbors which promotes mood CCM LCSW discussed CBT strategies to assist with managing symptoms  Patient reports recent attempt for bank fraud. The bank restored his account and are sending new cards. Patient identified strategies to prevent future scams, including, locking his belongings in a safe at home, feels previous aids were stealing his info 12/22: CCM LCSW discussed with patient the importance of contacting Law Enforcement if he has concerns for safety/fraud Validation and encouragement was provided during call. CCM LCSW encouraged patient to continue reflecting on past experiences and current friendships to promote positive mood and stress management. States he has enjoyed playing cards online with friends and praying to cope with stressors. 10/10  Patient shared that recent stress has been alleviated with drawing and talking with family and friends  CCM LCSW provided a supportive environment to allow patient to process his emotions Patient reports compliance with medications. Patient utilizes medicine cabinet with labels, AM and PM, to assist with medication management Patient reports that his strong spiritual relationship keeps him encouraged to move forward instead of focusing on past events 11/1: Patient enjoys walking to assist with stress management Patient reports becoming stir crazy from staying at home for majority of the day. CCM LCSW discussed benefits of participating in [facility]. Patient reports hx of attending PACE. States that he disenrolled because staff and  participants believed he was delusional because he has close relationships with various celebrities. Patient is not interested in initiating psychiatry and/or counseling to assist with strengthening support system 09/08: Patient reports that he participates in a group weekly to promote socialization with others 12/22: States he feels "liberated" without caregivers because he no longer has things stolen from him 2/23: LCSW completed an APS report log# [id number]  CCM LCSW discussed strategies to improve communication skills with aid to assist in preventing misunderstandings or offending others  CCM LCSW reviewed upcoming appointments with patient. He reports that he is utilizing [employer] for transportation. His aid has offered to bring patient to local stores to obtain cleaning supplies. Patient reports that he is aware that he can also utilize [employer] for transportation to medical appointments 10/10: Patient has a ride scheduled for 10/18 to take him grocery shopping 01/11: CCM LCSW completed a referral to Care Guide to assist patient with scheduling transportation to upcoming appt on 09/08/21 CCM LCSW discussed strategies to assist patient with memory concerns. Patient agreed to write notes and post them in high traffic areas of the home to remind him of upcoming appts and/or medications CCM LCSW collaborated with PCP, Engineer, building services, CCM RN, and ECM/HRMM Supervisor regarding patient care Other interventions: Solution-Focused Strategies, Mindfulness or Psychologist, educational, Active listening / Reflection utilized , Emotional Supportive Provided, Psychoeducation for mental health needs , Motivational Interviewing, Participation in counseling encouraged , Participation in support group encouraged , Consideration of in-home help encouraged , and Verbalization of feelings encouraged   Discussed plans with patient for ongoing care management follow up and provided patient with direct contact information  for care management team Collaboration with PCP regarding development and update of comprehensive plan of care as evidenced by provider attestation and co-signature Inter-disciplinary care team collaboration (see longitudinal plan of care) 08/26/21 Patient contacted to discuss options for Assisted Living. Patient confirms continued  desire to move where he can get help with his care needs. Patient states that he is being evicted and has "5"days to move out. However patient states that his landlord will "work:" with him Placement process discussed and the fact that the majority of his income will be going to pay for his stay at the facility "I have to pay rent somewhere" Collaboration phone call to [facility]-patient's local management entity [phone] to discuss assistance with finding placement for patient, patient provided them with verbal consent to assist with his placement needs. This social worker spoke with Mardene Celeste who place a referral to the Housing Team- they will contact this Education officer, museum for follow up 2/23: Follow up call completed and LCSW was provided with updated listing of local Adult Care Homes Patient Goals/Self-Care Activities: Over the next 120 days Attend all scheduled appointments with providers Contact office with any questions or concerns [facility] transportation number  to assist with attending medical appointments Practice relaxation or meditation daily Practice positive thinking and self-talk        [name], MSW, [email or web address] Phone [phone] 2:02 PM

## 2021-09-30 NOTE — Telephone Encounter (Signed)
Copied from [place] [id number]. Topic: General - Other >> Sep 30, 2021 10:52 AM [name] wrote: Reason for CRM: Pt called and stated he spoke with the clerk of court and they advised him the only thing he needs is paper work and letter to show Proof of his disability from [place]. His is to appear in court on March 1st / please advise  They also brought up to see if his social worker Delana Meyer can come to court with him/ please advise asap  He mentioned that even though he wants to stay at his home he thinks assisted living would be best for him due to being tired of the drama with the landlord

## 2021-09-30 NOTE — Telephone Encounter (Signed)
Copied from [place] [id number]. Topic: General - Other >> Sep 30, 2021  1:28 PM Leward Quan A wrote: Reason for CRM: Patient called in asking to speak to Ms Alwyn Ren having to do with him getting a ride to court on 10/05/21. Patient is a little wired because someone went to visit him at his home but he was not able to explain who the person was and where she came from. Please call patient at Ph#  [phone]

## 2021-09-30 NOTE — Patient Instructions (Signed)
Visit Information  Thank you for taking time to visit with me today. Please don't hesitate to contact me if I can be of assistance to you before our next scheduled telephone appointment.  Following are the goals we discussed today:  Patient Goals/Self-Care Activities: Over the next 120 days Attend all scheduled appointments with providers Contact office with any questions or concerns Utilize [facility] transportation number to assist with attending medical appointments Practice relaxation or meditation daily Practice positive thinking and self-talk  Our next appointment is 1 week  Please call the care guide team at [phone] if you need to cancel or reschedule your appointment.   If you are experiencing a Mental Health or [place] or need someone to talk to, please call the Suicide and Crisis Lifeline: 988 call 911   Patient verbalizes understanding of instructions and care plan provided today and agrees to view in [place]. Active MyChart status confirmed with patient.    [name], MSW, [email or web address] Phone [phone] 2:04 PM

## 2021-09-30 NOTE — Telephone Encounter (Signed)
Copied from [place]. Topic: General - Other >> Sep 30, 2021  8:28 AM Valere Dross wrote: Reason for CRM: Pt called in stating despite of everything that's going on, pt states he is okay, and don't want the office to worry about him, he will not hurt himself, Pt states he will be fine. He did mention he will be paying his rent, but they are still going to try to put him out. FYI

## 2021-09-30 NOTE — Telephone Encounter (Signed)
Spoke with Alden Benjamin with [facility] and provided her with verbal OK orders for the patient. Alden Benjamin verbalized understanding and has no further questions at this time.

## 2021-09-30 NOTE — Telephone Encounter (Signed)
Home Health Verbal Orders - Caller/Agency: [facility]  Callback Number: [phone]  Requesting OT/PT/Skilled Nursing/Social Work/Speech Therapy: Skilled Nursing   Frequency: 1w4   *Alden Benjamin also requested if Delana Meyer could reach out to her to get a little more information on pt, at [phone]

## 2021-10-01 ENCOUNTER — Other Ambulatory Visit: Payer: Self-pay

## 2021-10-01 ENCOUNTER — Emergency Department
Admission: EM | Admit: 2021-10-01 | Discharge: 2021-10-01 | Disposition: A | Discharge status: Home / Self Care | Payer: Medicare Other | Attending: Emergency Medicine | Admitting: Emergency Medicine

## 2021-10-01 DIAGNOSIS — I2510 Atherosclerotic heart disease of native coronary artery without angina pectoris: Secondary | ICD-10-CM | POA: Diagnosis not present

## 2021-10-01 DIAGNOSIS — I509 Heart failure, unspecified: Secondary | ICD-10-CM | POA: Diagnosis not present

## 2021-10-01 DIAGNOSIS — Z8616 Personal history of COVID-19: Secondary | ICD-10-CM | POA: Diagnosis not present

## 2021-10-01 DIAGNOSIS — J449 Chronic obstructive pulmonary disease, unspecified: Secondary | ICD-10-CM | POA: Insufficient documentation

## 2021-10-01 DIAGNOSIS — K922 Gastrointestinal hemorrhage, unspecified: Secondary | ICD-10-CM | POA: Insufficient documentation

## 2021-10-01 DIAGNOSIS — I4891 Unspecified atrial fibrillation: Secondary | ICD-10-CM | POA: Diagnosis not present

## 2021-10-01 DIAGNOSIS — E11319 Type 2 diabetes mellitus with unspecified diabetic retinopathy without macular edema: Secondary | ICD-10-CM | POA: Diagnosis not present

## 2021-10-01 DIAGNOSIS — Z743 Need for continuous supervision: Secondary | ICD-10-CM | POA: Diagnosis not present

## 2021-10-01 DIAGNOSIS — I10 Essential (primary) hypertension: Secondary | ICD-10-CM | POA: Diagnosis not present

## 2021-10-01 DIAGNOSIS — R58 Hemorrhage, not elsewhere classified: Secondary | ICD-10-CM | POA: Diagnosis not present

## 2021-10-01 DIAGNOSIS — I110 Hypertensive heart disease with heart failure: Secondary | ICD-10-CM | POA: Insufficient documentation

## 2021-10-01 DIAGNOSIS — K625 Hemorrhage of anus and rectum: Secondary | ICD-10-CM | POA: Diagnosis present

## 2021-10-01 DIAGNOSIS — Z7901 Long term (current) use of anticoagulants: Secondary | ICD-10-CM | POA: Insufficient documentation

## 2021-10-01 DIAGNOSIS — R6889 Other general symptoms and signs: Secondary | ICD-10-CM | POA: Diagnosis not present

## 2021-10-01 DIAGNOSIS — J45909 Unspecified asthma, uncomplicated: Secondary | ICD-10-CM | POA: Diagnosis not present

## 2021-10-01 LAB — CBC WITH DIFFERENTIAL/PLATELET
Abs Immature Granulocytes: 0.03 10*3/uL (ref 0.00–0.07)
Basophils Absolute: 0.1 10*3/uL (ref 0.0–0.1)
Basophils Relative: 1 %
Eosinophils Absolute: 0.2 10*3/uL (ref 0.0–0.5)
Eosinophils Relative: 2 %
HCT: 47.1 % (ref 39.0–52.0)
Hemoglobin: 14.6 g/dL (ref 13.0–17.0)
Immature Granulocytes: 0 %
Lymphocytes Relative: 14 %
Lymphs Abs: 1.4 10*3/uL (ref 0.7–4.0)
MCH: 28.5 pg (ref 26.0–34.0)
MCHC: 31 g/dL (ref 30.0–36.0)
MCV: 91.8 fL (ref 80.0–100.0)
Monocytes Absolute: 0.7 10*3/uL (ref 0.1–1.0)
Monocytes Relative: 7 %
Neutro Abs: 7.5 10*3/uL (ref 1.7–7.7)
Neutrophils Relative %: 76 %
Platelets: 210 10*3/uL (ref 150–400)
RBC: 5.13 MIL/uL (ref 4.22–5.81)
RDW: 15.3 % (ref 11.5–15.5)
WBC: 9.9 10*3/uL (ref 4.0–10.5)
nRBC: 0 % (ref 0.0–0.2)

## 2021-10-01 LAB — COMPREHENSIVE METABOLIC PANEL
ALT: 19 U/L (ref 0–44)
AST: 18 U/L (ref 15–41)
Albumin: 3.9 g/dL (ref 3.5–5.0)
Alkaline Phosphatase: 76 U/L (ref 38–126)
Anion gap: 11 (ref 5–15)
BUN: 18 mg/dL (ref 8–23)
CO2: 25 mmol/L (ref 22–32)
Calcium: 9.3 mg/dL (ref 8.9–10.3)
Chloride: 101 mmol/L (ref 98–111)
Creatinine, Ser: 0.87 mg/dL (ref 0.61–1.24)
GFR, Estimated: >60 mL/min (ref >60)
Glucose, Bld: 117 mg/dL — ABNORMAL HIGH (ref 70–99)
Potassium: 3.8 mmol/L (ref 3.5–5.1)
Sodium: 137 mmol/L (ref 135–145)
Total Bilirubin: 0.8 mg/dL (ref 0.3–1.2)
Total Protein: 7 g/dL (ref 6.5–8.1)

## 2021-10-01 NOTE — ED Triage Notes (Signed)
Pt comes into the ED via EMS from home with c/o blood in his stools, 5-73ml noted per EMS,   HR80 160/110 [id number] 97%RA

## 2021-10-01 NOTE — ED Provider Notes (Signed)
[facility] Provider Note    Event Date/Time   First MD Initiated Contact with Patient 10/01/21 1326     (approximate)   History   Blood In Stools   HPI  Evan [name] is a 66 y.o. male with past no history of CVA, A-fib on Xarelto, anxiety, asthma, CAD, CHF, DM, HTN, HDL, PTSD and history of colonic polyps and internal hemorrhoids seen on most recent colonoscopy from 11/26/2020 presents for evaluation of symptoms bright red blood per rectum he saw last night.  He states it occurred while he was in a bowel movement and was painful in the rectum.  He states he felt lightheaded when he saw the blood and lower himself to the ground and did not pass out or fall.  He states he has not had any subsequent bleed including any bleeding today or return of the pain.  No associated abdominal pain, back pain, chest pain, cough, shortness of breath or change in the paresthesias discomfort he has in his right arm from his CVA.  Denies any NSAID use or any other clear associated symptoms.  States the only reason he came to emergency room because he talked to his PCP who advised him to get checked out.     Past Medical History:  Diagnosis Date   Anginal pain ([place])    Anxiety disorder    Asthma    Atresia of esophagus without fistula    CAD (coronary artery disease)    Cellulitis    CHF (congestive heart failure) (HCC)    NYHA CLASS III,CHRONIC,DIASTOLIC   COPD (chronic obstructive pulmonary disease) ([place])    COVID-19    Diabetes mellitus without complication (HCC)    Edema    RIGHT LOWER LEG   Gastroesophageal reflux    H/O: GI bleed    History of pneumonia    Remote   History of scarlet fever    Childhood   Hyperlipidemia    Hypertension    Myocardial infarction ([place]) 2009   Obesity    Obstructive sleep apnea    Pain    CHRONIC BACK / ABDOMINAL   Panic disorder    Peripheral venous insufficiency    PTSD (post-traumatic stress disorder)    Retinopathy     DIABETIC   Stasis, venous    Stroke [facility])    Vertigo          Physical Exam  Triage Vital Signs: ED Triage Vitals  Enc Vitals Group     BP 10/01/21 1230 (!) 162/96     Pulse Rate 10/01/21 1230 63     Resp 10/01/21 1230 18     Temp 10/01/21 1230 98.4 F (36.9 C)     Temp Source 10/01/21 1230 Oral     SpO2 10/01/21 1230 97 %     Weight 10/01/21 1230 190 lb (86.2 kg)     Height 10/01/21 1230 5\' 5"  (1.651 m)     Head Circumference --      Peak Flow --      Pain Score 10/01/21 1153 0     Pain Loc --      Pain Edu? --      Excl. in [place]? --     Most recent vital signs: Vitals:   10/01/21 1230  BP: (!) 162/96  Pulse: 63  Resp: 18  Temp: 98.4 F (36.9 C)  SpO2: 97%    General: Awake, no distress.  CV:  Good peripheral perfusion.  Resp:  Normal effort.  Abd:  No distention.  Soft throughout. Other:  Appears to be a very small dilated external rectal pain without any significant hemorrhoid or fissure.  No active bleeding.   ED Results / Procedures / Treatments  Labs (all labs ordered are listed, but only abnormal results are displayed) Labs Reviewed  COMPREHENSIVE METABOLIC PANEL - Abnormal; Notable for the following components:      Result Value   Glucose, Bld 117 (*)    All other components within normal limits  CBC WITH DIFFERENTIAL/PLATELET     EKG  EKG is remarkable A-fib with a rate of 70, normal axis, otherwise unremarkable intervals with some nonspecific changes inferiorly but appear very similar to those seen on ECG from 09/13/2021 without other clear ischemic changes today.   RADIOLOGY    PROCEDURES:  Critical Care performed: No  Procedures    MEDICATIONS ORDERED IN ED: Medications - No data to display   IMPRESSION / MDM / [place] / ED COURSE  I reviewed the triage vital signs and the nursing notes.                              Differential diagnosis includes, but is not limited to possible bleeding from internal  hemorrhoid versus polyp.  He denies any abdominal pain is no abdominal tenderness or fever or leukocytosis or other findings to suggest diverticulitis at this time.  CMP shows no significant likely metabolic arrangements.  I suspect he may have had this vasovagal reaction to seeing the blood.  He has not had any other lightheadedness or dizziness today and his EKG is not suggestive of significant arrhythmia or acute ischemia.  In addition his CBC shows no leukocytosis and had a hemoglobin of 14.6 compared to 14.911 days ago.  Platelets are normal.  Patient is not orthostatic.  Given he has had not had any bleeding today with normal vitals, normal hemoglobin and is otherwise asymptomatic I think he is stable for discharge with close outpatient follow-up with GI.  Discussed this at length with the patient.  Also advised him to have his blood pressure rechecked by PCP.  Discussed returning immediately and calling 911 for any recurrence of bleeding or any new symptoms.  All questions answered.  Discharged in stable condition.  Return precautions advised and discussed.     FINAL CLINICAL IMPRESSION(S) / ED DIAGNOSES   Final diagnoses:  Gastrointestinal hemorrhage, unspecified gastrointestinal hemorrhage type  Hypertension, unspecified type     Rx / DC Orders   ED Discharge Orders     None        Note:  This document was prepared using Dragon voice recognition software and may include unintentional dictation errors.   Lucrezia Starch, MD 10/01/21 1435

## 2021-10-03 ENCOUNTER — Other Ambulatory Visit: Payer: Self-pay

## 2021-10-03 ENCOUNTER — Telehealth: Payer: Self-pay | Admitting: Nurse Practitioner

## 2021-10-03 ENCOUNTER — Emergency Department
Admission: EM | Admit: 2021-10-03 | Discharge: 2021-10-03 | Disposition: A | Discharge status: Home / Self Care | Payer: Medicare Other | Attending: Emergency Medicine | Admitting: Emergency Medicine

## 2021-10-03 DIAGNOSIS — F200 Paranoid schizophrenia: Secondary | ICD-10-CM | POA: Diagnosis not present

## 2021-10-03 DIAGNOSIS — F430 Acute stress reaction: Secondary | ICD-10-CM | POA: Diagnosis present

## 2021-10-03 DIAGNOSIS — R509 Fever, unspecified: Secondary | ICD-10-CM | POA: Insufficient documentation

## 2021-10-03 DIAGNOSIS — I5032 Chronic diastolic (congestive) heart failure: Secondary | ICD-10-CM | POA: Insufficient documentation

## 2021-10-03 DIAGNOSIS — R1110 Vomiting, unspecified: Secondary | ICD-10-CM | POA: Diagnosis present

## 2021-10-03 DIAGNOSIS — Z91199 Patient's noncompliance with other medical treatment and regimen due to unspecified reason: Secondary | ICD-10-CM | POA: Diagnosis not present

## 2021-10-03 DIAGNOSIS — I83891 Varicose veins of right lower extremities with other complications: Secondary | ICD-10-CM | POA: Insufficient documentation

## 2021-10-03 DIAGNOSIS — F4389 Other reactions to severe stress: Secondary | ICD-10-CM | POA: Diagnosis not present

## 2021-10-03 DIAGNOSIS — R112 Nausea with vomiting, unspecified: Secondary | ICD-10-CM | POA: Diagnosis not present

## 2021-10-03 DIAGNOSIS — R197 Diarrhea, unspecified: Secondary | ICD-10-CM | POA: Diagnosis not present

## 2021-10-03 LAB — COMPREHENSIVE METABOLIC PANEL
ALT: 17 U/L (ref 0–44)
AST: 19 U/L (ref 15–41)
Albumin: 3.8 g/dL (ref 3.5–5.0)
Alkaline Phosphatase: 83 U/L (ref 38–126)
Anion gap: 6 (ref 5–15)
BUN: 21 mg/dL (ref 8–23)
CO2: 23 mmol/L (ref 22–32)
Calcium: 8.8 mg/dL — ABNORMAL LOW (ref 8.9–10.3)
Chloride: 108 mmol/L (ref 98–111)
Creatinine, Ser: 0.85 mg/dL (ref 0.61–1.24)
GFR, Estimated: >60 mL/min (ref >60)
Glucose, Bld: 106 mg/dL — ABNORMAL HIGH (ref 70–99)
Potassium: 3.7 mmol/L (ref 3.5–5.1)
Sodium: 137 mmol/L (ref 135–145)
Total Bilirubin: 0.6 mg/dL (ref 0.3–1.2)
Total Protein: 7.3 g/dL (ref 6.5–8.1)

## 2021-10-03 LAB — CBC
HCT: 44.5 % (ref 39.0–52.0)
Hemoglobin: 13.9 g/dL (ref 13.0–17.0)
MCH: 28.3 pg (ref 26.0–34.0)
MCHC: 31.2 g/dL (ref 30.0–36.0)
MCV: 90.4 fL (ref 80.0–100.0)
Platelets: 209 10*3/uL (ref 150–400)
RBC: 4.92 MIL/uL (ref 4.22–5.81)
RDW: 15.2 % (ref 11.5–15.5)
WBC: 10.5 10*3/uL (ref 4.0–10.5)
nRBC: 0 % (ref 0.0–0.2)

## 2021-10-03 MED ORDER — ONDANSETRON 4 MG PO TBDP: 4.0000 mg | ORAL_TABLET | Freq: Three times a day (TID) | ORAL | 0 refills | Status: DC | PRN

## 2021-10-03 NOTE — Consult Note (Cosign Needed)
[place] [facility] Consult   Reason for Consult: Consult from Triage, saying "paranoid schizophrenic" Referring Physician:  Corky Downs Patient Identification: GAMBLE ENDERLE MRN:  [id number] Principal Diagnosis: Stress reaction causing mixed disturbance of emotion and conduct Diagnosis:  Principal Problem:   Stress reaction causing mixed disturbance of emotion and conduct Active Problems:   Chronic diastolic CHF (congestive heart failure) ([place])   Total Time spent with patient: 1 hour  Subjective:   Evan [name] is a 66 y.o. male patient admitted with paranoia, decompensation if schizophrenia.  HPI:  Patient seen and chart reviewed. Patient originally presented to ED with several physical complaints, but failed to elaborate with the ED provider according to chart notes. Patient then proceeded to share that he was concerned about getting an "infection" because he is being bitten by bull cockroaches."  There is a lengthy conversation between patient's outpatient providers; ED provider added this Probation officer to chat. ED provider has ruled out medical problem and referred to psychiatry for consult. [name] , NP is primary provider [phone]); Estevan Oaks, NP is palliative care NP. Writer spoke with Ms. Tamala Julian and Ms. [name].   In short, his outpatient providers are concerned about him because he is hoarding, not caring for himself regularly and sometimes not taking prescribed medication.  Primary care NP has been seeing him for 2.5 years. They have put an outpatient psych referral in. [facility], but recently discharged from there under the assumption he will be getting better care elsewhere if he switched insurance.  [facility] dx him with anxiety, but NP [name] believes he has markers of schizophrenia. She states that he is hoarding and not taking medications properly.  She did prescribe an antipsychotic, believing that he most likely has paranoid schizophrenia.  His outpatient providers are concerned that he is going to be evicted.  He has a court date on March 1 that he plans to attend.  He also has an phone appointment tomorrow, February 28 with a legal aid.  Outpatient providers states that they have been trying to find "assisted living" places for him but have so far been unsuccessful.  There is an APS case with him now.  Writer discussed with providers that they need to keep APS updated with the urgency if he is going to be evicted. Also advised that they can call his health insurance to see if they can get an urgent referral to outpatient provider.   On approach, Patient appears a bit anxious, but pleasant and cooperative.  Denies any thoughts of suicide or self-harm in any way.  He denies homicidal ideation or paranoia.  Denies auditory or visual hallucinations.  Does not appear to be responding to internal stimuli.  Patient agrees that sometimes he does forget to take his medicine.  He complains of various physical ailments, which ED provider has addressed prior to this assessment. Patient presents with some grandiose ideas, thinking that he is related to famous people or that he is friends with them because he follows them on social media.  However, he does not present as floridly psychotic. Patient may be evicted from his apartment, has court date coming up on Mar. 1, which will be important for him to attend. Patient is asking to be returned home at this time. He does not appear to be in imminent danger to himself or others and does not meet criteria for involuntary commitment.        Past Psychiatric History: anxiety; altered mental status;  delusions of grandeur  Risk to Self:   Risk to Others:   Prior Inpatient Therapy:   Prior Outpatient Therapy:    Past Medical History:  Past Medical History:  Diagnosis Date   Anginal pain ([place])    Anxiety disorder    Asthma    Atresia of esophagus without fistula    CAD (coronary artery disease)     Cellulitis    CHF (congestive heart failure) (HCC)    NYHA CLASS III,CHRONIC,DIASTOLIC   COPD (chronic obstructive pulmonary disease) ([place])    COVID-19    Diabetes mellitus without complication (HCC)    Edema    RIGHT LOWER LEG   Gastroesophageal reflux    H/O: GI bleed    History of pneumonia    Remote   History of scarlet fever    Childhood   Hyperlipidemia    Hypertension    Myocardial infarction ([place]) 2009   Obesity    Obstructive sleep apnea    Pain    CHRONIC BACK / ABDOMINAL   Panic disorder    Peripheral venous insufficiency    PTSD (post-traumatic stress disorder)    Retinopathy    DIABETIC   Stasis, venous    Stroke [facility])    Vertigo     Past Surgical History:  Procedure Laterality Date   CARDIAC CATHETERIZATION     CATARACT EXTRACTION Left    CATARACT EXTRACTION W/PHACO Right 05/03/2017   Procedure: CATARACT EXTRACTION PHACO AND INTRAOCULAR LENS PLACEMENT (IOC);  Surgeon: Leandrew Koyanagi, MD;  Location: [facility] ORS;  Service: Ophthalmology;  Laterality: Right;  [place] 00:35.3 AP% 12.3 CDE 4.33 Fluid Pack lot # [id number]        COLONOSCOPY WITH PROPOFOL N/A 11/26/2020   Procedure: COLONOSCOPY WITH PROPOFOL;  Surgeon: Jonathon Bellows, MD;  Location: [facility] ENDOSCOPY;  Service: Gastroenterology;  Laterality: N/A;  ANNETTE TO PICK UP [phone]   CORONARY ANGIOPLASTY WITH STENT PLACEMENT  2002   CORONARY ANGIOPLASTY WITH STENT PLACEMENT  1999   CORONARY ARTERY BYPASS GRAFT     x7   ESOPHAGOGASTRODUODENOSCOPY N/A 09/19/2016   Procedure: ESOPHAGOGASTRODUODENOSCOPY (EGD);  Surgeon: Lollie Sails, MD;  Location[facility] ENDOSCOPY;  Service: Endoscopy;  Laterality: N/A;   ESOPHAGOGASTRODUODENOSCOPY (EGD) WITH PROPOFOL  11/26/2020   Procedure: ESOPHAGOGASTRODUODENOSCOPY (EGD) WITH PROPOFOL;  Surgeon: Jonathon Bellows, MD;  Location: [facility] ENDOSCOPY;  Service: Gastroenterology;;   Family History:  Family History  Problem Relation Age of Onset   Heart attack Mother     Hypertension Mother    Hyperlipidemia Mother    Heart attack Brother 74       MI   Coronary artery disease Other    Family Psychiatric  History: unknown Social History:  Social History   Substance and Sexual Activity  Alcohol Use No     Social History   Substance and Sexual Activity  Drug Use No    Social History   Socioeconomic History   Marital status: Single    Spouse name: Not on file   Number of children: Not on file   Years of education: Not on file   Highest education level: Not on file  Occupational History    Comment: disabled  Tobacco Use   Smoking status: Never   Smokeless tobacco: Never  Vaping Use   Vaping Use: Never used  Substance and Sexual Activity   Alcohol use: No   Drug use: No   Sexual activity: Not Currently  Other Topics Concern   Not on file  Social  History Narrative   Disabled   Single   Social Determinants of Health   Financial Resource Strain: Low Risk    Difficulty of Paying Living Expenses: Not hard at all  Food Insecurity: No Food Insecurity   Worried About Charity fundraiser in the Last Year: Never true   Arboriculturist in the Last Year: Never true  Transportation Needs: No Transportation Needs   Lack of Transportation (Medical): No   Lack of Transportation (Non-Medical): No  Physical Activity: Sufficiently Active   Days of Exercise per Week: 7 days   Minutes of Exercise per Session: 30 min  Stress: No Stress Concern Present   Feeling of Stress : Not at all  Social Connections: Moderately Integrated   Frequency of Communication with Friends and Family: More than three times a week   Frequency of Social Gatherings with Friends and Family: More than three times a week   Attends Religious Services: More than 4 times per year   Active Member of [employer] or Organizations: Yes   Attends Music therapist: More than 4 times per year   Marital Status: Never married   Additional Social History:    Allergies:    Allergies  Allergen Reactions   Penicillin G Hives   Sulfa Antibiotics Hives   Tiotropium    Zoloft [Sertraline Hcl] Other (See Comments)    Labs:  Results for orders placed or performed during the hospital encounter of 10/03/21 (from the past 48 hour(s))  CBC     Status: None   Collection Time: 10/03/21  2:56 PM  Result Value Ref Range   WBC 10.5 4.0 - 10.5 K/uL   RBC 4.92 4.22 - 5.81 MIL/uL   Hemoglobin 13.9 13.0 - 17.0 g/dL   HCT 44.5 39.0 - 52.0 %   MCV 90.4 80.0 - 100.0 fL   MCH 28.3 26.0 - 34.0 pg   MCHC 31.2 30.0 - 36.0 g/dL   RDW 15.2 11.5 - 15.5 %   Platelets 209 150 - 400 K/uL   nRBC 0.0 0.0 - 0.2 %    Comment: Performed at [facility], [address], [place], [place] [address]  Comprehensive metabolic panel     Status: Abnormal   Collection Time: 10/03/21  2:56 PM  Result Value Ref Range   Sodium 137 135 - 145 mmol/L   Potassium 3.7 3.5 - 5.1 mmol/L   Chloride 108 98 - 111 mmol/L   CO2 23 22 - 32 mmol/L   Glucose, Bld 106 (H) 70 - 99 mg/dL    Comment: Glucose reference range applies only to samples taken after fasting for at least 8 hours.   BUN 21 8 - 23 mg/dL   Creatinine, Ser 0.85 0.61 - 1.24 mg/dL   Calcium 8.8 (L) 8.9 - 10.3 mg/dL   Total Protein 7.3 6.5 - 8.1 g/dL   Albumin 3.8 3.5 - 5.0 g/dL   AST 19 15 - 41 U/L   ALT 17 0 - 44 U/L   Alkaline Phosphatase 83 38 - 126 U/L   Total Bilirubin 0.6 0.3 - 1.2 mg/dL   GFR, Estimated >60 >60 mL/min    Comment: (NOTE) Calculated using the CKD-EPI Creatinine Equation (2021)    Anion gap 6 5 - 15    Comment: Performed at [facility], [address] [place], [place] [address]    No current facility-administered medications for this encounter.   Current Outpatient Medications  Medication Sig Dispense Refill  ondansetron (ZOFRAN-ODT) 4 MG disintegrating tablet Take 1 tablet (4 mg total) by mouth every 8 (eight) hours as needed for nausea or vomiting. 12 tablet 0   aspirin 81 MG  chewable tablet Chew 1 tablet (81 mg total) by mouth daily. 90 tablet 4   Blood Glucose Monitoring Suppl (ONETOUCH VERIO) w/Device KIT Use to check blood sugar 3 to 4 times a day and document.  Please bring to visits for review. 1 kit 0   Cholecalciferol 125 MCG (5000 UT) TABS Take 1 tablet (5,000 Units total) by mouth daily. 90 tablet 4   empagliflozin (JARDIANCE) 10 MG TABS tablet Take 1 tablet (10 mg total) by mouth daily. 90 tablet 4   ezetimibe (ZETIA) 10 MG tablet Take 1 tablet (10 mg total) by mouth daily. 90 tablet 4   furosemide (LASIX) 40 MG tablet TAKE 2 TABLETS BY MOUTH ONCE EVERY MORNING AND 1 TABLET ONCE EVERY EVENING 270 tablet 4   glucose blood (ONETOUCH VERIO) test strip Use to check blood sugar 3 to 4 times a day. 100 each 12   metoprolol succinate (TOPROL XL) 50 MG 24 hr tablet Take 1 tablet (50 mg total) by mouth daily. 90 tablet 4   nitroGLYCERIN (NITROSTAT) 0.4 MG SL tablet Place 1 tablet (0.4 mg total) under the tongue every 5 (five) minutes as needed for chest pain. 120 tablet 1   OneTouch Delica Lancets 41U MISC Use to check blood sugar 2-3 times a day. 100 each 5   pantoprazole (PROTONIX) 40 MG tablet Take 1 tablet (40 mg total) by mouth daily. 90 tablet 4   risperiDONE (RISPERDAL) 0.5 MG tablet Take 1 tablet (0.5 mg total) by mouth 2 (two) times daily. 180 tablet 4   rivaroxaban (XARELTO) 20 MG TABS tablet Take 1 tablet (20 mg total) by mouth at bedtime. 90 tablet 4   Saccharomyces boulardii (PROBIOTIC) 250 MG CAPS Take 1 capsule by mouth in the morning and at bedtime. 30 capsule 0   sharps container 1 each by Does not apply route as needed. 1 each 1   Skin Protectants, Misc. (EUCERIN) cream Apply topically as needed for dry skin. 454 g 0   SYMBICORT 80-4.5 MCG/ACT inhaler INHALE 2 PUFFS BY MOUTH TWICE DAILY 1 each 4   triamcinolone cream (KENALOG) 0.1 % Apply 1 application topically 2 (two) times daily. 30 g 0   vitamin B-12 (CYANOCOBALAMIN) 1000 MCG tablet Take 1 tablet  (1,000 mcg total) by mouth daily. 90 tablet 4    Musculoskeletal: Strength & Muscle Tone: decreased Gait & Station:  did not assess, has cane Patient leans: N/A    Psychiatric Specialty Exam:  Presentation  General Appearance: Casual  Eye Contact:Good  Speech:Clear and Coherent  Speech Volume:Normal  Handedness:Right   Mood and Affect  Mood:Anxious  Affect:Congruent   Thought Process  Thought Processes:Coherent  Descriptions of Associations:Intact  Orientation:Full (Time, Place and Person)  Thought Content:Delusions  History of Schizophrenia/Schizoaffective disorder:No  Duration of Psychotic Symptoms:No data recorded Hallucinations:No data recorded Ideas of Reference:None  Suicidal Thoughts:Suicidal Thoughts: No  Homicidal Thoughts:Homicidal Thoughts: No   Sensorium  Memory:Immediate Good  Judgment:Poor  Insight:Poor   Executive Functions  Concentration:Fair  Attention Span:Fair  [place]   Psychomotor Activity  Psychomotor Activity:Psychomotor Activity: Normal   Assets  Assets:Communication Skills; Desire for Improvement; Financial Resources/Insurance; Housing; Resilience; Social Support   Sleep  Sleep:Sleep: Fair   Physical Exam: Physical Exam Vitals and nursing note reviewed.  HENT:  Head: Normocephalic.     Nose: No congestion or rhinorrhea.  Eyes:     General:        Right eye: No discharge.        Left eye: No discharge.  Musculoskeletal:        General: Normal range of motion.     Cervical back: Normal range of motion.  Neurological:     Mental Status: He is alert and oriented to person, place, and time.  Psychiatric:        Attention and Perception: Attention normal.        Mood and Affect: Mood is anxious (mildly).        Behavior: Behavior normal.        Thought Content: Thought content is delusional (of grandeur/chronic).   Review of Systems   Psychiatric/Behavioral:  The patient is nervous/anxious (chronic in nature by chart nreview).   Blood pressure (!) 165/84, pulse 92, temperature 98.2 F (36.8 C), resp. rate 18, SpO2 95 %. There is no height or weight on file to calculate BMI.  Treatment Plan Summary: Plan referred back to his outpatient providers for care and follow up. Patient has legal aid phone appointment in the morning. Court date Mar 1 for possible eviction proceedings, which providers are aware. They are working with the Education officer, museum in the office to find alternative housing for patient, as well as psychiatry outpatient referral. Outpatient providers made APS referral  Disposition: No evidence of imminent risk to self or others at present.   Patient does not meet criteria for psychiatric inpatient admission. Supportive therapy provided about ongoing stressors. Discussed crisis plan, support from social network, calling 911, coming to the Emergency Department, and calling Suicide Hotline.  Sherlon Handing, NP 10/03/2021 6:16 PM

## 2021-10-03 NOTE — Telephone Encounter (Signed)
Patient states police and ambulance came to his home because they received a call that he cut his wrist. Patient states he did not do no such thing and the authorities advised they can not continue to come out to his home.  Patient states he is at [facility] due to passing out and rectum concerns, patient wanted to make PCP aware.   Attempted to reach the practice and was unable to connect

## 2021-10-03 NOTE — Progress Notes (Signed)
[facility]) Hospital Liaison note:  This patient is currently enrolled in [facility] outpatient-based Palliative Care. Will continue to follow for disposition.  Please call with any outpatient palliative questions or concerns.  Thank you, Lorelee Market, LPN [facility] Liaison [phone]

## 2021-10-03 NOTE — ED Provider Notes (Signed)
Patient seen and cleared by psychiatry.  He had come in with nausea and vomiting but had multiple vague complaints with concern for possible decompensated schizophrenia.  Per psychiatry and their discussion with his act team, he is at his baseline.  No indication for emergent psychiatric hospitalization.  He has not had any vomiting here in the ED.  His lab work is unremarkable.  No apparent emergent medical condition per previous provider.  Will discharge with outpatient Zofran and follow-up   Duffy Bruce, MD 10/03/21 [phone]

## 2021-10-03 NOTE — ED Provider Notes (Signed)
[facility] Provider Note    Event Date/Time   First MD Initiated Contact with Patient 10/03/21 1321     (approximate)   History   Emesis   HPI  Evan [name] is a 66 y.o. male presents to the ED with complaint of vomiting and hemorrhoids that started last evening.  When asked for more detailed information about his vomiting, fever, chills or diarrhea patient could not stay on the subject.  He began talking about how he was being poisoned and probably has a bacterial infection from all the trash that has been piled up on the sidewalk near his home.  He also states that he has been bitten by "bull cockroaches" which may have given him an infection.  Patient reports that someone called EMS to come check on him as he cut his wrist which he states he has not done but is not mad at EMS.  Patient reports that he is being evicted from his home.  He is unable to focus on answering any medical questions.  He talks about all the famous people that he knows and that he frequently goes to [place] with Evan [name] and Evan [name].  Currently rates his pain as a 4 out of 10.      Physical Exam   Triage Vital Signs: ED Triage Vitals  Enc Vitals Group     BP 10/03/21 1257 (!) 165/84     Pulse Rate 10/03/21 1257 92     Resp 10/03/21 1257 18     Temp 10/03/21 1257 98.2 F (36.8 C)     Temp src --      SpO2 10/03/21 1257 95 %     Weight --      Height --      Head Circumference --      Peak Flow --      Pain Score 10/03/21 1238 4     Pain Loc --      Pain Edu? --      Excl. in [place]? --     Most recent vital signs: Vitals:   10/03/21 1257  BP: (!) 165/84  Pulse: 92  Resp: 18  Temp: 98.2 F (36.8 C)  SpO2: 95%     General: Awake, no distress.  Alert and extremely talkative, but cannot stay focused on one subject before jumping to another. CV:  Good peripheral perfusion.  Resp:  Normal effort.  Abd:  No distention.  Other:  On examination of the right  arm there is small superficial markings that looked like self-inflicted wounds from scratching.  Right lower extremity anterior distal area is slightly erythematous and warm.  No soft tissue edema noted in this area.  Nontender calf area on palpation.  Skin is intact.  Multiple superficial varicosities are present.  Patient is able to move digits distally.     ED Results / Procedures / Treatments   Labs (all labs ordered are listed, but only abnormal results are displayed) Labs Reviewed  COMPREHENSIVE METABOLIC PANEL - Abnormal; Notable for the following components:      Result Value   Glucose, Bld 106 (*)    Calcium 8.8 (*)    All other components within normal limits  CBC     PROCEDURES:  Critical Care performed:   Procedures   MEDICATIONS ORDERED IN ED: Medications - No data to display   IMPRESSION / MDM / [place] / ED COURSE  I reviewed the triage  vital signs and the nursing notes.   Differential diagnosis includes, but is not limited to, paranoid schizophrenia uncontrolled and medically noncompliant.  Patient's primary care provider Evan Mantle, NP contacted staff of the ED and a secure chat letting us know that he was seen in the office and that medications still present indicate that he is not taking his medication.  Patient has known paranoid schizophrenia.  Today patient cannot stay focused during the conversation, is anxious and fearful that he is contacted a bacterial infection that may be fatal, also that he has been bitten by a "bull cockroach" that may have infected him.  He continues to have multiple complaints and changes subjects frequently unable to answer questions completely and constantly changing the subject to something unrelated to why he is here.  No vomiting was observed during the time that he was in the emergency department pod D area.  I spoke with Dr. Ermalinda Barrios who agreed that Mr. Asbill needs to be transferred to have a psychiatric  consult.  Dr. Quentin Cornwall is aware that he is being transferred to that area.    FINAL CLINICAL IMPRESSION(S) / ED DIAGNOSES   Final diagnoses:  Paranoid schizophrenia (Evan [name])  Medically noncompliant     Rx / DC Orders   ED Discharge Orders     None        Note:  This document was prepared using Dragon voice recognition software and may include unintentional dictation errors.   [name], PA-C 10/03/21 1540    Lavonia Drafts, MD 10/03/21 [id number]

## 2021-10-03 NOTE — ED Triage Notes (Signed)
Pt comes with c/o vomiting and hemorrhoids. Pt states this started last night.

## 2021-10-03 NOTE — ED Notes (Signed)
Pt to ED for multiple complaints, spoke with other nurse, pt is alert and talkative, stating that someone else called EMS but also stating that R arm hurts, did not mention vomiting.

## 2021-10-04 DIAGNOSIS — J449 Chronic obstructive pulmonary disease, unspecified: Secondary | ICD-10-CM

## 2021-10-04 DIAGNOSIS — E1159 Type 2 diabetes mellitus with other circulatory complications: Secondary | ICD-10-CM

## 2021-10-04 DIAGNOSIS — I5032 Chronic diastolic (congestive) heart failure: Secondary | ICD-10-CM

## 2021-10-04 DIAGNOSIS — I25118 Atherosclerotic heart disease of native coronary artery with other forms of angina pectoris: Secondary | ICD-10-CM

## 2021-10-04 DIAGNOSIS — I152 Hypertension secondary to endocrine disorders: Secondary | ICD-10-CM

## 2021-10-04 DIAGNOSIS — E1169 Type 2 diabetes mellitus with other specified complication: Secondary | ICD-10-CM

## 2021-10-04 DIAGNOSIS — M810 Age-related osteoporosis without current pathological fracture: Secondary | ICD-10-CM

## 2021-10-04 NOTE — Telephone Encounter (Signed)
Copied from [place] [phone]. Topic: General - Other >> Oct 04, 2021 12:10 PM Bayard Beaver wrote: Reason for CRM:pt called in says lawyer needs copy of his eviction notice that we have to be sent to him , because he has court tomorrow
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
## 2021-10-04 NOTE — Telephone Encounter (Signed)
Noted  

## 2021-10-04 NOTE — Telephone Encounter (Signed)
Spoke with patient regarding court date, transportation concerns and need to send eviction notice to legal aid.  While speaking with patient his cousin Ephriam Jenkins walked into patient's home. Patient requested that Mr. Olevia Bowens take him to court date scheduled for 10/05/2021.  Mr. Olevia Bowens agreed to do so.  Patient requested that I contact legal aid to provide a copy of his eviction papers but did not know the name of the attorney I needed to speak to. Patient provided phone number [phone] and requested that I call to obtain a fax number. Called legal aid and was informed that they will not speak with me or provide a fax number to send a copy of his eviction papers to unless the patient is present.  Will follow up with patient and [name], LCSW regarding next steps.  Will also provide letter written by provider [name] for patient to take to court as he requested to tqake to court with him on 10/05/2021.

## 2021-10-04 NOTE — Telephone Encounter (Signed)
Copied from [place] [phone]. Topic: General - Call Back - No Documentation >> Oct 04, 2021 12:46 PM Erick Blinks wrote: Reason for CRM: Pt called requesting to speak to the social worker concerning various things including his court appt tomorrow, and his [facility] home health nurses.   Best contact: [phone]

## 2021-10-05 ENCOUNTER — Telehealth: Payer: Self-pay | Admitting: Nurse Practitioner
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
## 2021-10-06 DIAGNOSIS — Z8673 Personal history of transient ischemic attack (TIA), and cerebral infarction without residual deficits: Secondary | ICD-10-CM | POA: Diagnosis not present

## 2021-10-06 DIAGNOSIS — Z9181 History of falling: Secondary | ICD-10-CM | POA: Diagnosis not present

## 2021-10-06 DIAGNOSIS — Z7901 Long term (current) use of anticoagulants: Secondary | ICD-10-CM | POA: Diagnosis not present

## 2021-10-06 DIAGNOSIS — Z7951 Long term (current) use of inhaled steroids: Secondary | ICD-10-CM | POA: Diagnosis not present

## 2021-10-06 DIAGNOSIS — I509 Heart failure, unspecified: Secondary | ICD-10-CM | POA: Diagnosis not present

## 2021-10-06 DIAGNOSIS — Z7982 Long term (current) use of aspirin: Secondary | ICD-10-CM | POA: Diagnosis not present

## 2021-10-06 DIAGNOSIS — I4891 Unspecified atrial fibrillation: Secondary | ICD-10-CM | POA: Diagnosis not present

## 2021-10-06 DIAGNOSIS — Z7984 Long term (current) use of oral hypoglycemic drugs: Secondary | ICD-10-CM | POA: Diagnosis not present

## 2021-10-06 DIAGNOSIS — J45909 Unspecified asthma, uncomplicated: Secondary | ICD-10-CM | POA: Diagnosis not present

## 2021-10-07 ENCOUNTER — Telehealth: Payer: Self-pay | Admitting: Nurse Practitioner

## 2021-10-07 ENCOUNTER — Ambulatory Visit (INDEPENDENT_AMBULATORY_CARE_PROVIDER_SITE_OTHER): Payer: Medicare Other | Admitting: Licensed Clinical Social Worker

## 2021-10-07 DIAGNOSIS — E1159 Type 2 diabetes mellitus with other circulatory complications: Secondary | ICD-10-CM

## 2021-10-07 NOTE — Telephone Encounter (Signed)
Pts ride has been set up and I let him know but I will give pt a call back to remind him that I set it up. ?

## 2021-10-07 NOTE — Telephone Encounter (Signed)
Pt called back wanting to cancel his appt b/c he had not heard anything about his ride. ?Assured [place] that transportation has been set up for his appt. ?Pt continues to ramble and go from one subject to another.  Talking about court today and how the others lied about him. ?Pt is anxious to get into assisted living, as he says he cannot go outside and talk with anyone or they start a confrontation.  Pt states he needs conversation.  Also reports he has n food, no way to get to the store and cannot afford uber to go 4 miles. ?Pt assured me he will keep appt on 3/08. ?

## 2021-10-07 NOTE — Telephone Encounter (Signed)
Pt is calling to ask can transportation be arranged for his 10/12/21? ? ?And has there been an update on the assisted Living? ? ?Please advise CB- [phone] ?

## 2021-10-09 NOTE — Patient Instructions (Signed)
Managing Anxiety, Adult ?After being diagnosed with anxiety, you may be relieved to know why you have felt or behaved a certain way. You may also feel overwhelmed about the treatment ahead and what it will mean for your life. With care and support, you can manage this condition. ?How to manage lifestyle changes ?Managing stress and anxiety ?Stress is your body's reaction to life changes and events, both good and bad. Most stress will last just a few hours, but stress can be ongoing and can lead to more than just stress. Although stress can play a major role in anxiety, it is not the same as anxiety. Stress is usually caused by something external, such as a deadline, test, or competition. Stress normally passes after the triggering event has ended.  ?Anxiety is caused by something internal, such as imagining a terrible outcome or worrying that something will go wrong that will devastate you. Anxiety often does not go away even after the triggering event is over, and it can become long-term (chronic) worry. It is important to understand the differences between stress and anxiety and to manage your stress effectively so that it does not lead to an anxious response. ?Talk with your health care provider or a counselor to learn more about reducing anxiety and stress. He or she may suggest tension reduction techniques, such as: ?Music therapy. Spend time creating or listening to music that you enjoy and that inspires you. ?Mindfulness-based meditation. Practice being aware of your normal breaths while not trying to control your breathing. It can be done while sitting or walking. ?Centering prayer. This involves focusing on a word, phrase, or sacred image that means something to you and brings you peace. ?Deep breathing. To do this, expand your stomach and inhale slowly through your nose. Hold your breath for 3-5 seconds. Then exhale slowly, letting your stomach muscles relax. ?Self-talk. Learn to notice and identify  thought patterns that lead to anxiety reactions and change those patterns to thoughts that feel peaceful. ?Muscle relaxation. Taking time to tense muscles and then relax them. ?Choose a tension reduction technique that fits your lifestyle and personality. These techniques take time and practice. Set aside 5-15 minutes a day to do them. Therapists can offer counseling and training in these techniques. The training to help with anxiety may be covered by some insurance plans. ?Other things you can do to manage stress and anxiety include: ?Keeping a stress diary. This can help you learn what triggers your reaction and then learn ways to manage your response. ?Thinking about how you react to certain situations. You may not be able to control everything, but you can control your response. ?Making time for activities that help you relax and not feeling guilty about spending your time in this way. ?Doing visual imagery. This involves imagining or creating mental pictures to help you relax. ?Practicing yoga. Through yoga poses, you can lower tension and promote relaxation. ? ?Medicines ?Medicines can help ease symptoms. Medicines for anxiety include: ?Antidepressant medicines. These are usually prescribed for long-term daily control. ?Anti-anxiety medicines. These may be added in severe cases, especially when panic attacks occur. ?Medicines will be prescribed by a health care provider. When used together, medicines, psychotherapy, and tension reduction techniques may be the most effective treatment. ?Relationships ?Relationships can play a big part in helping you recover. Try to spend more time connecting with trusted friends and family members. ?Consider going to couples counseling if you have a partner, taking family education classes, or going to family   therapy. ?Therapy can help you and others better understand your condition. ?How to recognize changes in your anxiety ?Everyone responds differently to treatment for  anxiety. Recovery from anxiety happens when symptoms decrease and stop interfering with your daily activities at home or work. This may mean that you will start to: ?Have better concentration and focus. Worry will interfere less in your daily thinking. ?Sleep better. ?Be less irritable. ?Have more energy. ?Have improved memory. ?It is also important to recognize when your condition is getting worse. Contact your health care provider if your symptoms interfere with home or work and you feel like your condition is not improving. ?Follow these instructions at home: ?Activity ?Exercise. Adults should do the following: ?Exercise for at least 150 minutes each week. The exercise should increase your heart rate and make you sweat (moderate-intensity exercise). ?Strengthening exercises at least twice a week. ?Get the right amount and quality of sleep. Most adults need 7-9 hours of sleep each night. ?Lifestyle ? ?Eat a healthy diet that includes plenty of vegetables, fruits, whole grains, low-fat dairy products, and lean protein. ?Do not eat a lot of foods that are high in fats, added sugars, or salt (sodium). ?Make choices that simplify your life. ?Do not use any products that contain nicotine or tobacco. These products include cigarettes, chewing tobacco, and vaping devices, such as e-cigarettes. If you need help quitting, ask your health care provider. ?Avoid caffeine, alcohol, and certain over-the-counter cold medicines. These may make you feel worse. Ask your pharmacist which medicines to avoid. ?General instructions ?Take over-the-counter and prescription medicines only as told by your health care provider. ?Keep all follow-up visits. This is important. ?Where to find support ?You can get help and support from these sources: ?Self-help groups. ?Online and community organizations. ?A trusted spiritual leader. ?Couples counseling. ?Family education classes. ?Family therapy. ?Where to find more information ?You may find  that joining a support group helps you deal with your anxiety. The following sources can help you locate counselors or support groups near you: ?Mental Health America: www.mentalhealthamerica.net ?Anxiety and Depression Association of America (ADAA): www.adaa.org ?National Alliance on Mental Illness (NAMI): www.nami.org ?Contact a health care provider if: ?You have a hard time staying focused or finishing daily tasks. ?You spend many hours a day feeling worried about everyday life. ?You become exhausted by worry. ?You start to have headaches or frequently feel tense. ?You develop chronic nausea or diarrhea. ?Get help right away if: ?You have a racing heart and shortness of breath. ?You have thoughts of hurting yourself or others. ?If you ever feel like you may hurt yourself or others, or have thoughts about taking your own life, get help right away. Go to your nearest emergency department or: ?Call your local emergency services (911 in the [place] ?Call a suicide crisis helpline, such as the National Suicide Prevention Lifeline at 1-800-273-8255 or 988 in the [place] This is open 24 hours a day in the [place] ?Text the Crisis Text Line at 741741 (in the [place] ?Summary ?Taking steps to learn and use tension reduction techniques can help calm you and help prevent triggering an anxiety reaction. ?When used together, medicines, psychotherapy, and tension reduction techniques may be the most effective treatment. ?Family, friends, and partners can play a big part in supporting you. ?This information is not intended to replace advice given to you by your health care provider. Make sure you discuss any questions you have with your health care provider. ?Document Revised: 02/16/2021 Document Reviewed: 11/14/2020 ?Elsevier Patient   Education ? 2022 Elsevier Inc. ? ?

## 2021-10-10 NOTE — Telephone Encounter (Signed)
Pt called to check on status of assisted living / stated the judge advised him to stay in home until he gets a place to live / please advise  ?

## 2021-10-12 ENCOUNTER — Ambulatory Visit (INDEPENDENT_AMBULATORY_CARE_PROVIDER_SITE_OTHER): Payer: Medicare Other | Admitting: Nurse Practitioner

## 2021-10-12 ENCOUNTER — Other Ambulatory Visit: Payer: Self-pay

## 2021-10-12 ENCOUNTER — Encounter: Payer: Self-pay | Admitting: Nurse Practitioner

## 2021-10-12 VITALS — BP 133/85 | HR 83 | Temp 98.3°F | Ht 65.0 in | Wt 205.2 lb

## 2021-10-12 DIAGNOSIS — F200 Paranoid schizophrenia: Secondary | ICD-10-CM

## 2021-10-12 DIAGNOSIS — R42 Dizziness and giddiness: Secondary | ICD-10-CM

## 2021-10-12 DIAGNOSIS — R413 Other amnesia: Secondary | ICD-10-CM | POA: Diagnosis not present

## 2021-10-12 NOTE — Progress Notes (Signed)
BP 133/85    Pulse 83    Temp 98.3 F (36.8 C) (Oral)    Ht '5\' 5"'$  (1.651 m)    Wt 205 lb 3.2 oz (93.1 kg)    SpO2 98%    BMI 34.15 kg/m    Subjective:    Patient ID: Evan [name], male    DOB: February 03, 1956, 66 y.o.   MRN: [id number]  HPI: Evan [name] is a 66 y.o. male  Chief Complaint  Patient presents with   Memory Loss    Patient states he is having any issues with his memory changes. Patient states he is taking therapy for it.   Dizziness   SCHIZOPHRENIA Currently is taking Risperdal 0.5 MG BID.  Has referral to psychiatry, has not heard from them yet.  Recently went to court due to living quarters, CCM SW and APS SW working on new home situation for him.  Currently he reports [facility] is also involved.    Has referral to neurology, appears they attempted to call and schedule -- will check on this as he reports he did not hear from them.  History of CVA in April 2022 -- he reports he feels memory is improving some and dizziness is improved with more support and less stressors. Mood status: stable Satisfied with current treatment?: yes Symptom severity: moderate  Duration of current treatment : chronic Side effects: no Medication compliance: good compliance Psychotherapy/counseling: none Previous psychiatric medications: as above Depressed mood: no Anxious mood: yes Anhedonia: no Significant weight loss or gain: no Insomnia: none Fatigue: yes Feelings of worthlessness or guilt: no Impaired concentration/indecisiveness: yes Suicidal ideations: no Hopelessness: no Crying spells: no Depression screen [facility] 2/9 10/12/2021 09/20/2021 08/12/2021 05/11/2021 02/25/2021  Decreased Interest 2 0 0 - 0  Down, Depressed, Hopeless 0 0 0 0 0  PHQ - 2 Score 2 0 0 0 0  Altered sleeping 0 '2 1 1 '$ -  Tired, decreased energy '3 2 1 '$ 0 -  Change in appetite 0 2 - 0 -  Feeling bad or failure about yourself  0 1 0 0 -  Trouble concentrating 0 0 0 0 -  Moving slowly or fidgety/restless 0 1 0 0  -  Suicidal thoughts 0 0 0 0 -  PHQ-9 Score '5 8 2 1 '$ -  Difficult doing work/chores Not difficult at all Somewhat difficult Not difficult at all Not difficult at all -  Some recent data might be hidden    GAD 7 : Generalized Anxiety Score 10/12/2021 09/20/2021 08/12/2021 05/11/2021  Nervous, Anxious, on Edge 0 '1 1 1  '$ Control/stop worrying 0 '1 2 1  '$ Worry too much - different things '1 1 1 1  '$ Trouble relaxing 1 2 0 0  Restless 1 2 0 0  Easily annoyed or irritable 0 2 2 0  Afraid - awful might happen 0 0 1 1  Total GAD 7 Score '3 9 7 4  '$ Anxiety Difficulty Not difficult at all - Somewhat difficult Not difficult at all    Relevant past medical, surgical, family and social history reviewed and updated as indicated. Interim medical history since our last visit reviewed. Allergies and medications reviewed and updated.  Review of Systems  Constitutional:  Negative for activity change, diaphoresis, fatigue and fever.  Respiratory:  Negative for cough, chest tightness, shortness of breath and wheezing.   Cardiovascular:  Positive for leg swelling (baseline per his report). Negative for chest pain and palpitations.  Gastrointestinal: Negative.   Endocrine:  Negative for cold intolerance, heat intolerance, polydipsia, polyphagia and polyuria.  Neurological: Negative.   Psychiatric/Behavioral:  Negative for decreased concentration, self-injury, sleep disturbance and suicidal ideas. The patient is not nervous/anxious.    Per HPI unless specifically indicated above     Objective:    BP 133/85    Pulse 83    Temp 98.3 F (36.8 C) (Oral)    Ht '5\' 5"'$  (1.651 m)    Wt 205 lb 3.2 oz (93.1 kg)    SpO2 98%    BMI 34.15 kg/m   Wt Readings from Last 3 Encounters:  10/12/21 205 lb 3.2 oz (93.1 kg)  10/01/21 190 lb (86.2 kg)  09/24/21 200 lb 9.9 oz (91 kg)    Physical Exam Vitals and nursing note reviewed.  Constitutional:      General: He is awake. He is not in acute distress.    Appearance: He is  well-developed. He is morbidly obese. He is not ill-appearing.  HENT:     Head: Normocephalic and atraumatic.     Right Ear: Hearing normal. No drainage.     Left Ear: Hearing normal. No drainage.  Eyes:     General: Lids are normal.        Right eye: No discharge.        Left eye: No discharge.     Pupils: Pupils are equal, round, and reactive to light.  Neck:     Thyroid: No thyromegaly.     Vascular: No carotid bruit or JVD.     Trachea: Trachea normal.  Cardiovascular:     Rate and Rhythm: Normal rate and regular rhythm.     Pulses:          Dorsalis pedis pulses are 1+ on the right side and 1+ on the left side.       Posterior tibial pulses are 1+ on the right side and 1+ on the left side.     Heart sounds: Normal heart sounds, S1 normal and S2 normal. No murmur heard.   No gallop.     Comments: Varicose veins bilateral legs with hemosiderin staining bilaterally R>L.  No open wounds noted. Pulmonary:     Effort: Pulmonary effort is normal. No accessory muscle usage or respiratory distress.     Breath sounds: Normal breath sounds.  Abdominal:     General: Bowel sounds are normal.     Palpations: Abdomen is soft.     Tenderness: There is no abdominal tenderness.  Musculoskeletal:        General: Normal range of motion.     Cervical back: Normal range of motion and neck supple.     Right lower leg: No edema.     Left lower leg: No edema.  Lymphadenopathy:     Cervical: No cervical adenopathy.  Skin:    General: Skin is warm and dry.     Capillary Refill: Capillary refill takes less than 2 seconds.  Neurological:     Mental Status: He is alert and oriented to person, place, and time.     Gait: Gait is intact.  Psychiatric:        Attention and Perception: Attention normal.        Mood and Affect: Mood normal.        Speech: Speech normal.        Behavior: Behavior normal. Behavior is cooperative.     Comments: Tangential thought processes per baseline.  Talked about  his friendship with [name]  and Ivin Booty.  This is baseline for him.  Does not stay on topic, distracts easily.    Results for orders placed or performed during the hospital encounter of 10/03/21  CBC  Result Value Ref Range   WBC 10.5 4.0 - 10.5 K/uL   RBC 4.92 4.22 - 5.81 MIL/uL   Hemoglobin 13.9 13.0 - 17.0 g/dL   HCT 44.5 39.0 - 52.0 %   MCV 90.4 80.0 - 100.0 fL   MCH 28.3 26.0 - 34.0 pg   MCHC 31.2 30.0 - 36.0 g/dL   RDW 15.2 11.5 - 15.5 %   Platelets 209 150 - 400 K/uL   nRBC 0.0 0.0 - 0.2 %  Comprehensive metabolic panel  Result Value Ref Range   Sodium 137 135 - 145 mmol/L   Potassium 3.7 3.5 - 5.1 mmol/L   Chloride 108 98 - 111 mmol/L   CO2 23 22 - 32 mmol/L   Glucose, Bld 106 (H) 70 - 99 mg/dL   BUN 21 8 - 23 mg/dL   Creatinine, Ser 0.85 0.61 - 1.24 mg/dL   Calcium 8.8 (L) 8.9 - 10.3 mg/dL   Total Protein 7.3 6.5 - 8.1 g/dL   Albumin 3.8 3.5 - 5.0 g/dL   AST 19 15 - 41 U/L   ALT 17 0 - 44 U/L   Alkaline Phosphatase 83 38 - 126 U/L   Total Bilirubin 0.6 0.3 - 1.2 mg/dL   GFR, Estimated >60 >60 mL/min   Anion gap 6 5 - 15      Assessment & Plan:   Problem List Items Addressed This Visit       Other   Memory changes    Since CVA, with underlying mental health disorder.  Referral to neurology placed last visit after discussion with patient. 6CIT = 14.  Will check on this and see if we can get scheduled.      Paranoid schizophrenia ([place]) - Primary    Chronic, ongoing with continued grandiose thought processes present, ?more schizophrenia on presentation -- high suspicion for this.   - Continue current medication regimen and adjust as needed -- may benefit change to Olanzapine in future.  On review old records history of missing doses Risperdal, will continue current medication at this time and adjust as needed -- pill packs have been beneficial for patient. - Continue palliative collaboration.   - Continue collaboration with CCM SW.   - Would  benefit from psychiatry, a referral placed last visit and he has not heard from them, check on this.   - Working on assisted living placement for patient, which would offer benefit.         Other Visit Diagnoses     Dizziness       Improved at this time.       Time: 25 minutes, >50% spent counseling/or care coordination   Follow up plan: Return in about 5 weeks (around 11/16/2021) for T2DM, HTN/HLD, MOOD, HF, OSTEOPOROSIS.

## 2021-10-12 NOTE — Assessment & Plan Note (Signed)
Since CVA, with underlying mental health disorder.  Referral to neurology placed last visit after discussion with patient. 6CIT = 14.  Will check on this and see if we can get scheduled. ?

## 2021-10-12 NOTE — Assessment & Plan Note (Signed)
Chronic, ongoing with continued grandiose thought processes present, ?more schizophrenia on presentation -- high suspicion for this.   ?- Continue current medication regimen and adjust as needed -- may benefit change to Olanzapine in future.  On review old records history of missing doses Risperdal, will continue current medication at this time and adjust as needed -- pill packs have been beneficial for patient. ?- Continue palliative collaboration.   ?- Continue collaboration with CCM SW.   ?- Would benefit from psychiatry, a referral placed last visit and he has not heard from them, check on this.   ?- Working on assisted living placement for patient, which would offer benefit.   ? ?

## 2021-10-12 NOTE — Telephone Encounter (Signed)
This is exciting news. Thank you for the update! ?

## 2021-10-13 DIAGNOSIS — I4891 Unspecified atrial fibrillation: Secondary | ICD-10-CM | POA: Diagnosis not present

## 2021-10-13 DIAGNOSIS — Z7951 Long term (current) use of inhaled steroids: Secondary | ICD-10-CM | POA: Diagnosis not present

## 2021-10-13 DIAGNOSIS — Z8673 Personal history of transient ischemic attack (TIA), and cerebral infarction without residual deficits: Secondary | ICD-10-CM | POA: Diagnosis not present

## 2021-10-13 DIAGNOSIS — Z7982 Long term (current) use of aspirin: Secondary | ICD-10-CM | POA: Diagnosis not present

## 2021-10-13 DIAGNOSIS — Z7901 Long term (current) use of anticoagulants: Secondary | ICD-10-CM | POA: Diagnosis not present

## 2021-10-13 DIAGNOSIS — I509 Heart failure, unspecified: Secondary | ICD-10-CM | POA: Diagnosis not present

## 2021-10-13 DIAGNOSIS — Z7984 Long term (current) use of oral hypoglycemic drugs: Secondary | ICD-10-CM | POA: Diagnosis not present

## 2021-10-13 DIAGNOSIS — J45909 Unspecified asthma, uncomplicated: Secondary | ICD-10-CM | POA: Diagnosis not present

## 2021-10-13 DIAGNOSIS — Z9181 History of falling: Secondary | ICD-10-CM | POA: Diagnosis not present

## 2021-10-14 DIAGNOSIS — I4891 Unspecified atrial fibrillation: Secondary | ICD-10-CM | POA: Diagnosis not present

## 2021-10-14 DIAGNOSIS — J45909 Unspecified asthma, uncomplicated: Secondary | ICD-10-CM | POA: Diagnosis not present

## 2021-10-14 DIAGNOSIS — Z7951 Long term (current) use of inhaled steroids: Secondary | ICD-10-CM | POA: Diagnosis not present

## 2021-10-14 DIAGNOSIS — Z7901 Long term (current) use of anticoagulants: Secondary | ICD-10-CM | POA: Diagnosis not present

## 2021-10-14 DIAGNOSIS — Z8673 Personal history of transient ischemic attack (TIA), and cerebral infarction without residual deficits: Secondary | ICD-10-CM | POA: Diagnosis not present

## 2021-10-14 DIAGNOSIS — Z9181 History of falling: Secondary | ICD-10-CM | POA: Diagnosis not present

## 2021-10-14 DIAGNOSIS — I509 Heart failure, unspecified: Secondary | ICD-10-CM | POA: Diagnosis not present

## 2021-10-14 DIAGNOSIS — Z7982 Long term (current) use of aspirin: Secondary | ICD-10-CM | POA: Diagnosis not present

## 2021-10-14 DIAGNOSIS — Z7984 Long term (current) use of oral hypoglycemic drugs: Secondary | ICD-10-CM | POA: Diagnosis not present

## 2021-10-18 ENCOUNTER — Other Ambulatory Visit: Payer: Self-pay

## 2021-10-18 ENCOUNTER — Other Ambulatory Visit: Payer: Medicare Other | Admitting: Primary Care

## 2021-10-18 DIAGNOSIS — E1169 Type 2 diabetes mellitus with other specified complication: Secondary | ICD-10-CM

## 2021-10-18 DIAGNOSIS — Z515 Encounter for palliative care: Secondary | ICD-10-CM

## 2021-10-18 DIAGNOSIS — F430 Acute stress reaction: Secondary | ICD-10-CM

## 2021-10-18 DIAGNOSIS — Z8673 Personal history of transient ischemic attack (TIA), and cerebral infarction without residual deficits: Secondary | ICD-10-CM

## 2021-10-18 DIAGNOSIS — F423 Hoarding disorder: Secondary | ICD-10-CM

## 2021-10-18 NOTE — Progress Notes (Signed)
? ? ?Manufacturing engineer ?Community Palliative [facility] Consult Note ?Telephone: [phone]  ?Fax: [phone]  ? ? ?Date of encounter: 10/18/21 ?10:18 AM ?PATIENT NAME: Evan [name] ?[place] [address]   ?[phone] (home)  ?DOB: [birth year] ?MRN: [id number] ?PRIMARY CARE PROVIDER:    ?[name], NP,  ?[address] [place] [address] ?[phone] ? ?REFERRING PROVIDER:   ?[name], NP ?[address] [place],  [place] [address] ?[phone] ? ?RESPONSIBLE PARTY:    ?Contact Information   ? ? Name Relation Home Work Mobile  ? [place] [phone]  [phone]  ? ?  ? ? ? ?I met face to face with patient in  home. Palliative Care was asked to follow this patient by consultation request of  [name], NP to address advance care planning and complex medical decision making. This is a follow up visit. ? ?                                 ASSESSMENT AND PLAN / RECOMMENDATIONS:  ? ?Symptom Management/Plan: ? ?HTN: slight elevation, has not had am meds. Not sure he's taking meds as they don't seem to have been dispensed since my last visit. I will check with [facility]. Pt endorses he is taking. Reviewed labs from ED trip. ? ?Food insecurity: some canned goods but I believe his stove is not functioning. Some rotting food in frig. I have made stone soup menu referral and it will start next week. Albumin 3.8.  ? ?Housing insecurity; Pt relays he still must leave after court but they are looking for ALF for him. Communicated with PCP office who is apprised. ? ?Mood; relaxed, not worried about housing, looking forward to aLF. I feel having others around to talk to will be positive for him. Safe in home for now, although still has clutter and hoarding conditions. ? ?Follow up Palliative Care Visit: Palliative care will continue to follow for complex medical decision making, advance care planning, and clarification of goals. Return 4 weeks or prn. ? ?This  visit was coded based on medical decision making (MDM). ? ?PPS: 50% ? ?HOSPICE ELIGIBILITY/DIAGNOSIS: TBD ? ?Chief Complaint: htn, med compliance ? ?HISTORY OF PRESENT ILLNESS:  Evan [name] is a 66 y.o. year old male  with schizophrenia, hoarding disorder, htn, dm . Patient seen today to review palliative care needs to include medical decision making and advance care planning as appropriate.  ? ?History obtained from review of EMR, discussion with primary team, and interview with family, facility staff/caregiver and/or Mr. Seat.  ?I reviewed available labs, medications, imaging, studies and related documents from the EMR.  Records reviewed and summarized above.  ? ?ROS ? ? ?General: NAD ?EYES: denies vision changes ?ENMT: denies dysphagia ?Cardiovascular: denies chest pain, denies DOE ?Pulmonary: denies cough, denies increased SOB ?Abdomen: endorses good appetite, denies constipation, endorses continence of bowel ?GU: denies dysuria, endorses continence of urine ?MSK:  denies  increased weakness,  no falls reported ?Skin: denies rashes or wounds ?Neurological: denies pain, denies insomnia ?Psych: Endorses positive mood ? ?Physical Exam: ?Current and past weights: endorses loss ?Constitutional: 160/90 HR 88 RR 19 ?General: wnwd ?EYES: anicteric sclera, lids intact, no discharge  ?ENMT: intact hearing, oral mucous membranes moist, dentition intact ?CV: no LE edema ?Pulmonary:no increased work of breathing, no cough, room air ?Abdomen: intake 100%,  no ascites ?MSK:  mild  sarcopenia, moves all extremities, ambulatory ?Skin: warm and dry, no rashes or wounds on visible skin ?Neuro:  no  new generalized weakness,  + cognitive impairment, non-anxious affect ? ? ?Thank you for the opportunity to participate in the care of Evan [name].  The palliative care team will continue to follow. Please call our office at [phone] if we can be of additional assistance.  ? ?Jason Coop, NP DNP,  AGPCNP-BC ? ?COVID-19 PATIENT SCREENING TOOL ?Asked and negative response unless otherwise noted:  ? ?Have you had symptoms of covid, tested positive or been in contact with someone with symptoms/positive test in the past 5-10 days?  ? ?

## 2021-10-19 DIAGNOSIS — I509 Heart failure, unspecified: Secondary | ICD-10-CM | POA: Diagnosis not present

## 2021-10-19 DIAGNOSIS — Z7901 Long term (current) use of anticoagulants: Secondary | ICD-10-CM | POA: Diagnosis not present

## 2021-10-19 DIAGNOSIS — Z7984 Long term (current) use of oral hypoglycemic drugs: Secondary | ICD-10-CM | POA: Diagnosis not present

## 2021-10-19 DIAGNOSIS — Z8673 Personal history of transient ischemic attack (TIA), and cerebral infarction without residual deficits: Secondary | ICD-10-CM | POA: Diagnosis not present

## 2021-10-19 DIAGNOSIS — Z9181 History of falling: Secondary | ICD-10-CM | POA: Diagnosis not present

## 2021-10-19 DIAGNOSIS — I4891 Unspecified atrial fibrillation: Secondary | ICD-10-CM | POA: Diagnosis not present

## 2021-10-19 DIAGNOSIS — Z7982 Long term (current) use of aspirin: Secondary | ICD-10-CM | POA: Diagnosis not present

## 2021-10-19 DIAGNOSIS — J45909 Unspecified asthma, uncomplicated: Secondary | ICD-10-CM | POA: Diagnosis not present

## 2021-10-19 DIAGNOSIS — Z7951 Long term (current) use of inhaled steroids: Secondary | ICD-10-CM | POA: Diagnosis not present

## 2021-10-20 ENCOUNTER — Other Ambulatory Visit: Payer: Medicare Other | Admitting: Primary Care

## 2021-10-20 ENCOUNTER — Telehealth: Payer: Self-pay | Admitting: Nurse Practitioner

## 2021-10-20 NOTE — Telephone Encounter (Signed)
Pt is calling for assistance. Pt states that his landlord has said he only has 6 days left for he is evicted. Pt states that he now longer  has [facility] because he has no psychial address. Pt has not been able to reach his court appointed social worker - Rosita Jansen [phone]  ? ?Please advise CB- [phone] ?

## 2021-10-24 ENCOUNTER — Telehealth: Payer: Self-pay | Admitting: Nurse Practitioner

## 2021-10-24 NOTE — Telephone Encounter (Signed)
Copied from [place] [phone]. Topic: General - Other ?>> Oct 24, 2021  9:32 AM [name] wrote: ?Reason for CRM: Pt stated he hasnt had anyone at his home in Days / [facility] has released him because they stated he doesn't have a physical address/ pt stated he has been given all his belongings away to charity / pt stated that the landlord advised him he has 6 days to get out and wanted to start an argument / he didn't wanna start an argument with her so he just ignored her and went inside his home/  he stated the judge told him to stay put / and asked what he should do and about assisted living/ he wanted to let [name] know what was going on / he stated he is doing well and fine and has his meds.  ? ?Hes taking it one day at time / he stated he doesn't lock his door and his door is open /  he stated to tell [name] she did great on that letter for his disability / please advise ?

## 2021-10-24 NOTE — Telephone Encounter (Signed)
Copied from [place] [id number]. Topic: General - Other ?>> Oct 24, 2021 12:43 PM [name] wrote: ?Reason for CRM: Pt stated he wont be home if anyone comes on 3.28.23 due to having a bus come get him to go to the grocery store / pt also stated he doesn't have transportation to get to his 4.6.23 appt and will need to cancel if no transportation is found / pt states they hang up on him/ please advise ?

## 2021-10-29 ENCOUNTER — Encounter: Payer: Self-pay | Admitting: Emergency Medicine

## 2021-10-29 ENCOUNTER — Emergency Department: Payer: Medicare Other

## 2021-10-29 ENCOUNTER — Other Ambulatory Visit: Payer: Self-pay

## 2021-10-29 ENCOUNTER — Emergency Department
Admission: EM | Admit: 2021-10-29 | Discharge: 2021-10-29 | Disposition: A | Discharge status: Home / Self Care | Payer: Medicare Other | Attending: Emergency Medicine | Admitting: Emergency Medicine

## 2021-10-29 DIAGNOSIS — E119 Type 2 diabetes mellitus without complications: Secondary | ICD-10-CM | POA: Diagnosis not present

## 2021-10-29 DIAGNOSIS — I509 Heart failure, unspecified: Secondary | ICD-10-CM | POA: Diagnosis not present

## 2021-10-29 DIAGNOSIS — R9431 Abnormal electrocardiogram [ECG] [EKG]: Secondary | ICD-10-CM | POA: Diagnosis not present

## 2021-10-29 DIAGNOSIS — I110 Hypertensive heart disease with heart failure: Secondary | ICD-10-CM | POA: Insufficient documentation

## 2021-10-29 DIAGNOSIS — R42 Dizziness and giddiness: Secondary | ICD-10-CM | POA: Diagnosis not present

## 2021-10-29 DIAGNOSIS — I499 Cardiac arrhythmia, unspecified: Secondary | ICD-10-CM | POA: Diagnosis not present

## 2021-10-29 DIAGNOSIS — H8110 Benign paroxysmal vertigo, unspecified ear: Secondary | ICD-10-CM | POA: Diagnosis not present

## 2021-10-29 DIAGNOSIS — Z743 Need for continuous supervision: Secondary | ICD-10-CM | POA: Diagnosis not present

## 2021-10-29 DIAGNOSIS — M79601 Pain in right arm: Secondary | ICD-10-CM | POA: Diagnosis not present

## 2021-10-29 DIAGNOSIS — R55 Syncope and collapse: Secondary | ICD-10-CM | POA: Diagnosis not present

## 2021-10-29 DIAGNOSIS — R0689 Other abnormalities of breathing: Secondary | ICD-10-CM | POA: Diagnosis not present

## 2021-10-29 DIAGNOSIS — R404 Transient alteration of awareness: Secondary | ICD-10-CM | POA: Diagnosis not present

## 2021-10-29 LAB — COMPREHENSIVE METABOLIC PANEL
ALT: 19 U/L (ref 0–44)
AST: 22 U/L (ref 15–41)
Albumin: 4 g/dL (ref 3.5–5.0)
Alkaline Phosphatase: 85 U/L (ref 38–126)
Anion gap: 11 (ref 5–15)
BUN: 17 mg/dL (ref 8–23)
CO2: 25 mmol/L (ref 22–32)
Calcium: 9.6 mg/dL (ref 8.9–10.3)
Chloride: 106 mmol/L (ref 98–111)
Creatinine, Ser: 0.93 mg/dL (ref 0.61–1.24)
GFR, Estimated: >60 mL/min (ref >60)
Glucose, Bld: 130 mg/dL — ABNORMAL HIGH (ref 70–99)
Potassium: 3.4 mmol/L — ABNORMAL LOW (ref 3.5–5.1)
Sodium: 142 mmol/L (ref 135–145)
Total Bilirubin: 0.8 mg/dL (ref 0.3–1.2)
Total Protein: 7.6 g/dL (ref 6.5–8.1)

## 2021-10-29 LAB — CBC WITH DIFFERENTIAL/PLATELET
Abs Immature Granulocytes: 0.03 10*3/uL (ref 0.00–0.07)
Basophils Absolute: 0.1 10*3/uL (ref 0.0–0.1)
Basophils Relative: 1 %
Eosinophils Absolute: 0.2 10*3/uL (ref 0.0–0.5)
Eosinophils Relative: 2 %
HCT: 46.6 % (ref 39.0–52.0)
Hemoglobin: 15 g/dL (ref 13.0–17.0)
Immature Granulocytes: 0 %
Lymphocytes Relative: 13 %
Lymphs Abs: 1.2 10*3/uL (ref 0.7–4.0)
MCH: 29.1 pg (ref 26.0–34.0)
MCHC: 32.2 g/dL (ref 30.0–36.0)
MCV: 90.5 fL (ref 80.0–100.0)
Monocytes Absolute: 0.9 10*3/uL (ref 0.1–1.0)
Monocytes Relative: 9 %
Neutro Abs: 7.4 10*3/uL (ref 1.7–7.7)
Neutrophils Relative %: 75 %
Platelets: 249 10*3/uL (ref 150–400)
RBC: 5.15 MIL/uL (ref 4.22–5.81)
RDW: 15.4 % (ref 11.5–15.5)
WBC: 9.8 10*3/uL (ref 4.0–10.5)
nRBC: 0 % (ref 0.0–0.2)

## 2021-10-29 LAB — TROPONIN I (HIGH SENSITIVITY): Troponin I (High Sensitivity): 15 ng/L (ref <18)

## 2021-10-29 IMAGING — CT CT HEAD W/O CM
4 series · 17 of 47 positions shown, 19 images · non-contrast
Comparison: [DATE]

CLINICAL DATA: Dizziness, presyncope



[Series 2: head wo · axial · 0.40mm/px · z∈[-182,-62]mm · 7 of 33 slices shown, 9 images]
[im 5/33  brain]
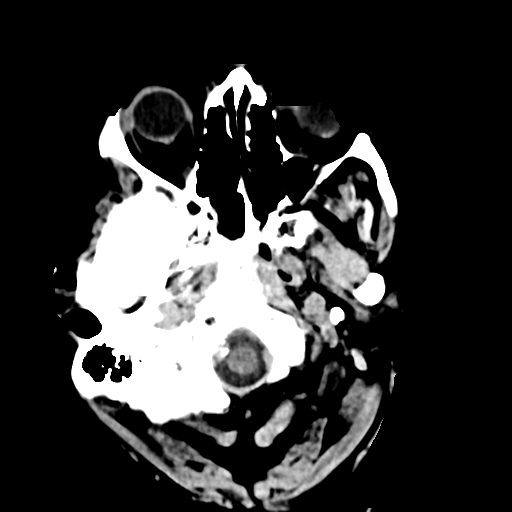
[im 5/33  bone]
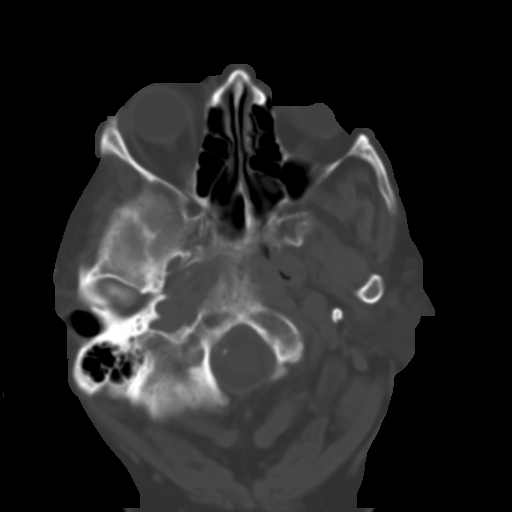
[im 9/33  brain]
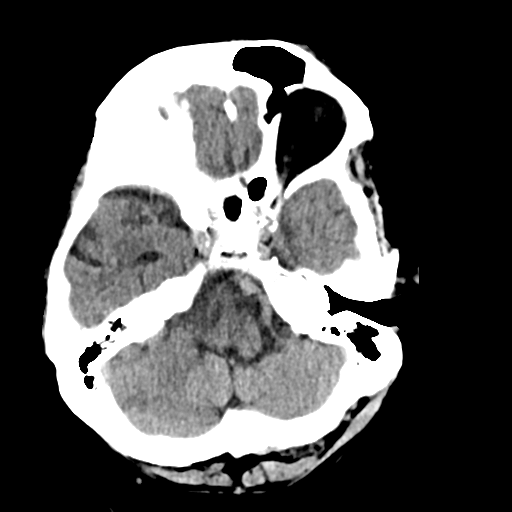
[im 13/33  brain]
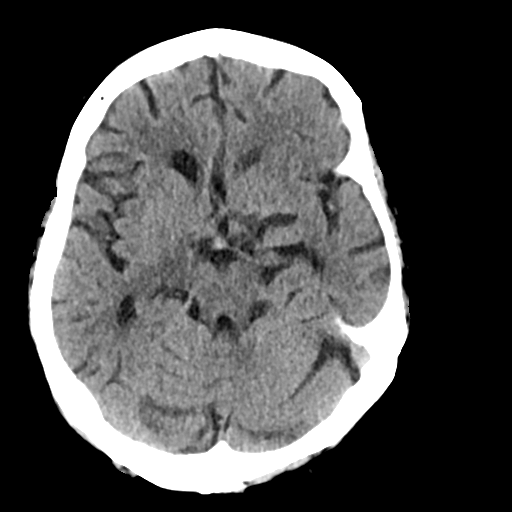
[im 17/33  brain]
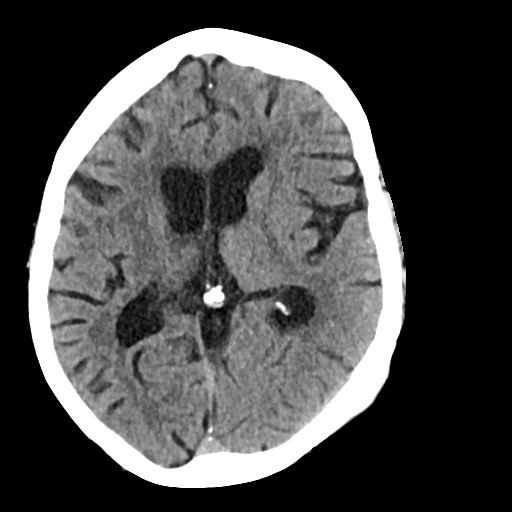
[im 21/33  brain]
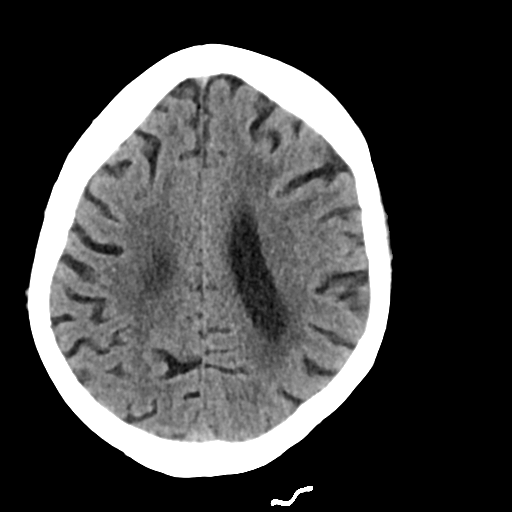
[im 21/33  bone]
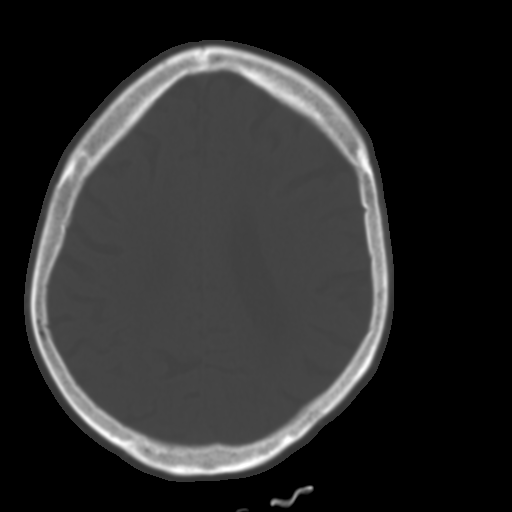
[im 25/33  brain]
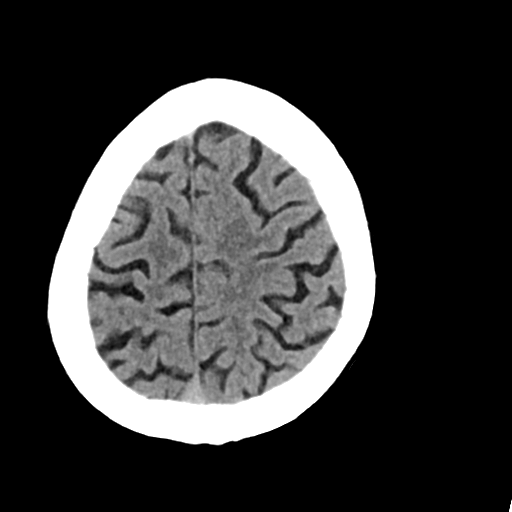
[im 29/33  brain]
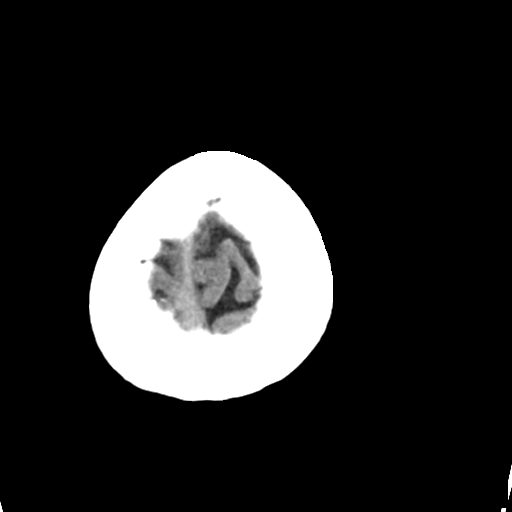

[Series 3: head bone · axial · 0.40mm/px · z∈[-186,-130]mm · 4 of 83 slices shown]
[im 9/83  bone]
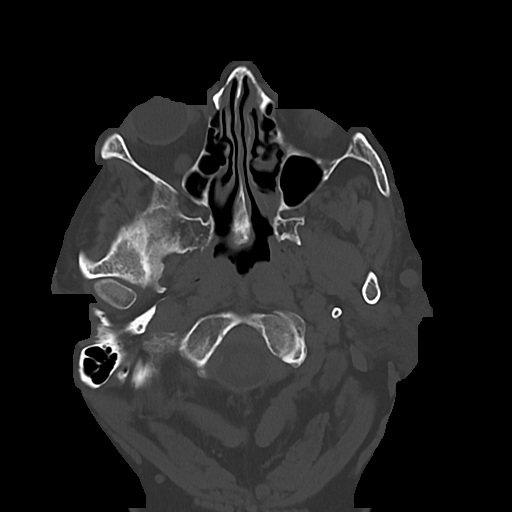
[im 17/83  bone]
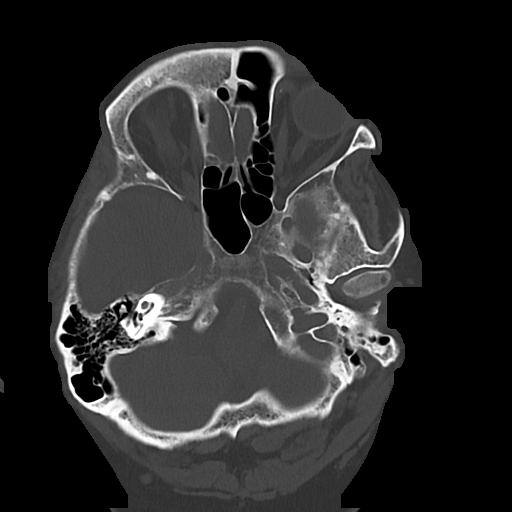
[im 25/83  bone]
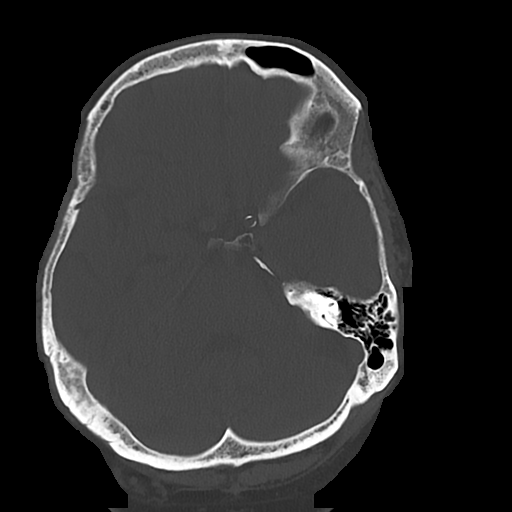
[im 37/83  bone]
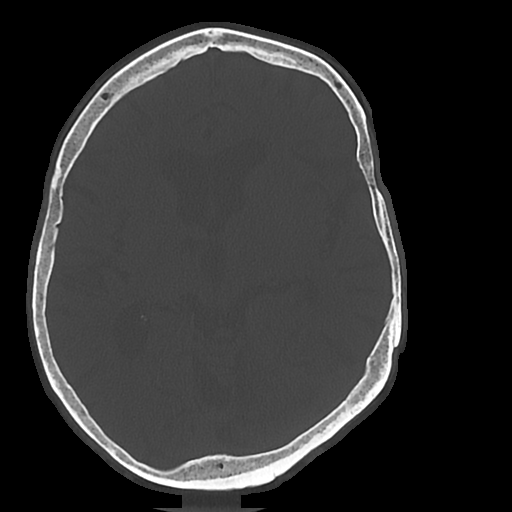

[Series 4: cor soft · coronal · 0.42mm/px · 3 of 71 slices shown]
[im 24/71  brain]
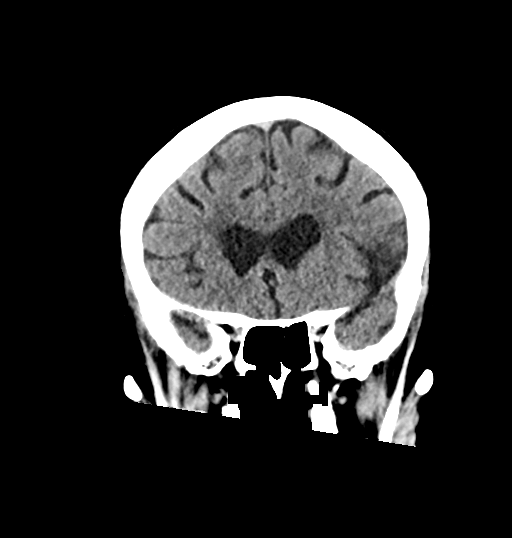
[im 32/71  brain]
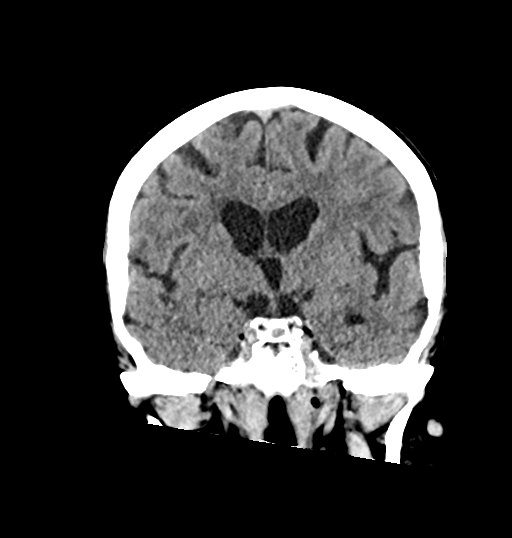
[im 39/71  brain]
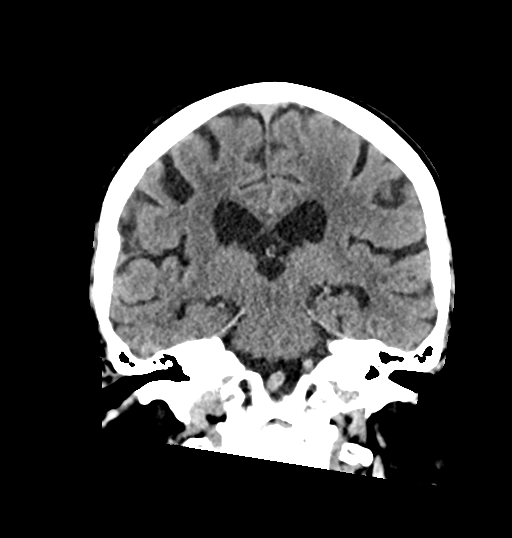

[Series 5: sag soft · sagittal · 0.41mm/px · 3 of 65 slices shown]
[im 26/65  brain]
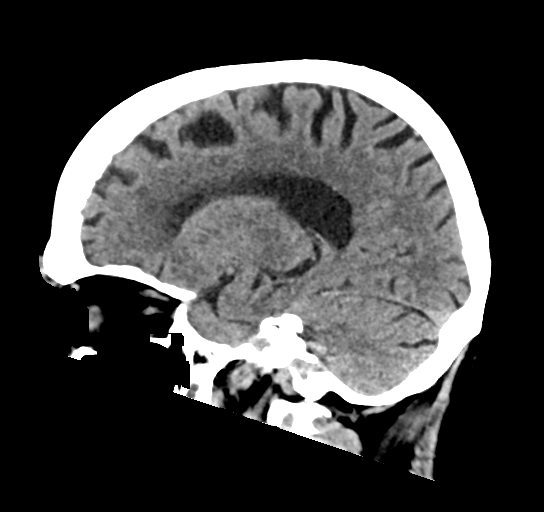
[im 33/65  brain]
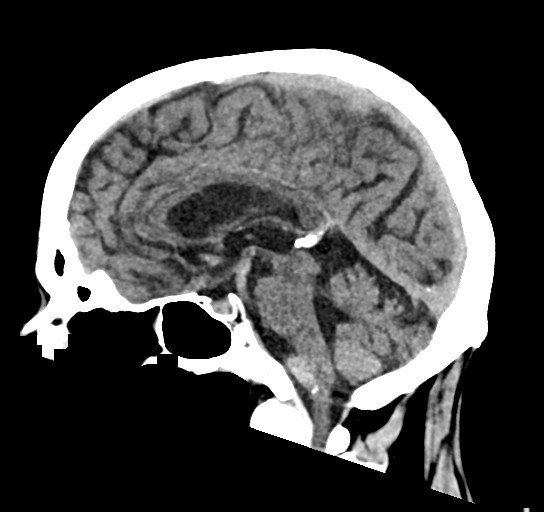
[im 39/65  brain]
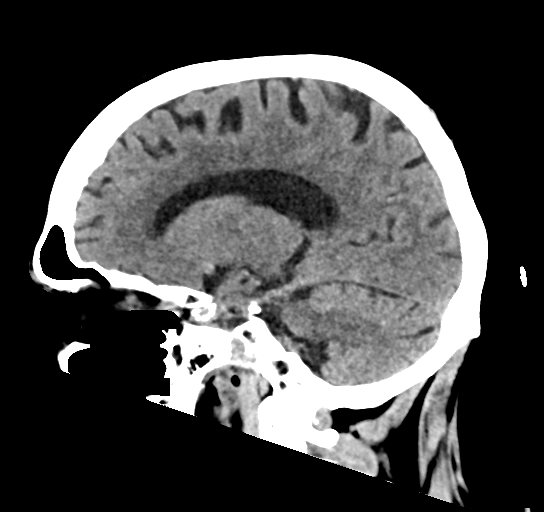

[17 of 47 positions shown; findings below may reference images not displayed]

FINDINGS: Brain: Right thalamic hypodensity consistent with chronic infarct.
Stable chronic small-vessel ischemic changes within the
periventricular white matter. No signs of acute infarct or
hemorrhage. Lateral ventricles and remaining midline structures are
unremarkable. No acute extra-axial fluid collections. No mass
effect.

Vascular: Stable atherosclerosis.  No hyperdense vessel.

Skull: Normal. Negative for fracture or focal lesion.

Sinuses/Orbits: No acute finding.

Other: None.
IMPRESSION: 1. No acute intracranial process.
2. Chronic small vessel ischemic changes as above.

## 2021-10-29 NOTE — ED Notes (Signed)
Pt verbalized understanding of discharge instructions. Pt advised if symptoms worsen to return to ED. RN spoke with [place] caregiver at the [facility] group home and gave report.  ?

## 2021-10-29 NOTE — ED Provider Notes (Signed)
? ?[facility] ?Provider Note ? ? Event Date/Time  ? First MD Initiated Contact with Patient 10/29/21 1849   ?  (approximate) ?History  ?Dizziness ? ?HPI ?Evan [name] is a 66 y.o. male with a past medical history of paroxysmal atrial fibrillation, paranoid schizophrenia, CHF, hypertension, and diabetes who presents via EMS from a group home with complaints of vertigo and right arm pain.  Patient states that he has had a stroke in the past with right arm symptoms but has never had pain in this area.  Patient states that it feels like it is throbbing, nonradiating, and has no exacerbating or relieving factors.  Patient also endorses worsening of his baseline vertigo. ?Physical Exam  ?Triage Vital Signs: ?ED Triage Vitals  ?Enc Vitals Group  ?   BP   ?   Pulse   ?   Resp   ?   Temp   ?   Temp src   ?   SpO2   ?   Weight   ?   Height   ?   Head Circumference   ?   Peak Flow   ?   Pain Score   ?   Pain Loc   ?   Pain Edu?   ?   Excl. in [place]?   ? ?Most recent vital signs: ?Vitals:  ? 10/29/21 1900 10/29/21 2200  ?BP: 110/69 109/67  ?Pulse: 79 64  ?Resp: (!) 25 15  ?Temp:    ?SpO2: 97% 99%  ? ?General: Awake, oriented x4. ?CV:  Good peripheral perfusion.  ?Resp:  Normal effort.  ?Abd:  No distention.  ?Other:  Obese elderly Caucasian male laying in bed in no distress.  Right arm does not show any significant discoloration, weakness, or sensation change ?ED Results / Procedures / Treatments  ?Labs ?(all labs ordered are listed, but only abnormal results are displayed) ?Labs Reviewed  ?COMPREHENSIVE METABOLIC PANEL - Abnormal; Notable for the following components:  ?    Result Value  ? Potassium 3.4 (*)   ? Glucose, Bld 130 (*)   ? All other components within normal limits  ?CBC WITH DIFFERENTIAL/PLATELET  ?TROPONIN I (HIGH SENSITIVITY)  ?TROPONIN I (HIGH SENSITIVITY)  ? ?EKG ?ED ECG REPORT ?I, Naaman Plummer, the attending physician, personally viewed and interpreted this ECG. ?Date:  10/29/2021 ?EKG Time: 1855 ?Rate: 79 ?Rhythm: Atrial fibrillation ?QRS Axis: normal ?Intervals: normal ?ST/T Wave abnormalities: normal ?Narrative Interpretation: Atrial fibrillation.  No evidence of acute ischemia ?RADIOLOGY ?ED MD interpretation: CT of the head without contrast interpreted by me shows no evidence of acute abnormalities including no intracerebral hemorrhage, obvious masses, or significant edema ?-Agree with radiology assessment ?Official radiology report(s): ?CT Head Wo Contrast ? ?Result Date: 10/29/2021 ?CLINICAL DATA:  Dizziness, presyncope EXAM: CT HEAD WITHOUT CONTRAST TECHNIQUE: Contiguous axial images were obtained from the base of the skull through the vertex without intravenous contrast. RADIATION DOSE REDUCTION: This exam was performed according to the departmental dose-optimization program which includes automated exposure control, adjustment of the mA and/or kV according to patient size and/or use of iterative reconstruction technique. COMPARISON:  07/09/2021 FINDINGS: Brain: Right thalamic hypodensity consistent with chronic infarct. Stable chronic small-vessel ischemic changes within the periventricular white matter. No signs of acute infarct or hemorrhage. Lateral ventricles and remaining midline structures are unremarkable. No acute extra-axial fluid collections. No mass effect. Vascular: Stable atherosclerosis.  No hyperdense vessel. Skull: Normal. Negative for fracture or focal lesion. Sinuses/Orbits: No acute finding. Other:  None. IMPRESSION: 1. No acute intracranial process. 2. Chronic small vessel ischemic changes as above. Electronically Signed   By: Randa Ngo M.D.   On: 10/29/2021 19:50   ?PROCEDURES: ?Critical Care performed: No ?.1-3 Lead EKG Interpretation ?Performed by: Naaman Plummer, MD ?Authorized by: Naaman Plummer, MD  ? ?  Interpretation: abnormal   ?  ECG rate:  68 ?  ECG rate assessment: normal   ?  Rhythm: atrial fibrillation   ?  Ectopy: none   ?   Conduction: normal   ?MEDICATIONS ORDERED IN ED: ?Medications - No data to display ?IMPRESSION / MDM / ASSESSMENT AND PLAN / ED COURSE  ?I reviewed the triage vital signs and the nursing notes. ?             ?               ?The patient is on the cardiac monitor to evaluate for evidence of arrhythmia and/or significant heart rate changes. ? ?Based on History, Exam, and Findings, presentation not consistent with syncope, seizure, stroke, meningitis, symptomatic anemia (gastrointestinal bleed), Increased ICP (cerebral tumor/mass), ICH. Additionally, I have a low suspicion for AOM, labyrinthitis, or other infectious process. ?Tx: meclizine ?Reassessment: Prior to discharge symptoms controlled, patient well appearing. ?Disposition:  Discharge. Strict return precautions discussed w/ full understanding. Advise follow up with primary care provider within 24-48 hours. ? ?  ?FINAL CLINICAL IMPRESSION(S) / ED DIAGNOSES  ? ?Final diagnoses:  ?Vertigo  ?Arm pain, anterior, right  ? ?Rx / DC Orders  ? ?ED Discharge Orders   ? ? None  ? ?  ? ?Note:  This document was prepared using Dragon voice recognition software and may include unintentional dictation errors. ?  ?Naaman Plummer, MD ?10/30/21 1543 ? ?

## 2021-10-29 NOTE — ED Notes (Signed)
RN spoke to caregiver from group home and states pt moved to new group [facility] and not [facility].  ?

## 2021-10-29 NOTE — ED Triage Notes (Signed)
Pt coming from [facility] via [facility]. Per EMS pt was eating dinner and got up and felt dizzy and thought he was going to pass out. Pt denies any fall and no LOC.  ? ?Pt states his right arm has been tingling and throbbing pain in his right forearm that started today.  ? ? ? ? ?

## 2021-10-31 ENCOUNTER — Telehealth: Payer: Self-pay

## 2021-10-31 NOTE — Chronic Care Management (AMB) (Signed)
?Care Management  ?Collaboration  Note ? ?10/31/2021 ?Name: Evan [name] MRN: [id number] DOB: 05/07/1956 ? ?Evan [name] is a 66 y.o. year old male who is a primary care patient of [name], Henrine Screws T, NP. The CCM team was consulted reference care coordination needs for Transportation Needs  and [place] and Resources. ? ?Assessment: Patient was not interviewed or contacted during this encounter. See Care Plan or interventions for patient self-care actives. ?  ?Intervention:Conducted brief assessment, recommendations and relevant information discussed.  CCM LCSW collaborated with Adult Protective Services CM, Stephanie Shotwell to assist with meeting patient's needs. An updated FL2 form was provided to assist with obtaining placement. Care Guide referral was placed to assist pt with upcoming PCP appt ? ?Follow up Plan: Patient would like continued follow-up from CCM LCSW.  per patient's request will follow up on 11/14/21.  Will call office if needed prior to next encounter. ?  ?Review of patient past medical history, allergies, medications, and health status, including review of pertinent consultant reports was performed as part of comprehensive evaluation and provision of care management/care coordination services.  ? ?Care Plan ?Conditions to be addressed/monitored per PCP order: CAD, COPD, and Anxiety, Transportation and Housing barriers  ? ?Care Plan : General Social Work (Adult)  ?Updates made by Rebekah Chesterfield, LCSW since 10/31/2021 12:00 AM  ?  ? ?Problem: Response to Treatment (Depression)   ?  ? ?Long-Range Goal: Response to Treatment Maximized   ?Start Date: 12/27/2020  ?This Visit's Progress: On track  ?Recent Progress: On track  ?Priority: High  ?Note:   ?Current barriers:   ?Acute Mental Health needs related to Anxiety ?Housing barriers, Level of care concerns, and Mental Health Concerns  ?Needs Support, Education, and Care Coordination in order to meet unmet mental health  needs. ?Clinical Goal(s): Over the next 120 days, patient will work with SW to reduce or manage symptoms of anxiety and increase knowledge and/or ability of: coping skills, healthy habits, self-management skills, and stress reduction.until connected for ongoing counseling.  ?Clinical Interventions:  ?Assessed patient's previous treatment, needs, coping skills, current treatment, support system and barriers to care ?Provided mental health counseling with regard to managing mental health conditions ?Patient reports difficulty managing chronic health conditions and psychosocial stressors ?Concerns that rent isn't paid for. Concerns for not passing inspection and it needed to be completed 06/23/21 [name] is his leasing agent who contacted patient while CCM LCSW was on phone and shared that she will work with patient on meeting inspection. Scheduled to visit home on Tuesday, 06/28/21 11/28: Patient reports [name] has assisted him with passing the inspection 01/11: CCM LCSW discussed benefits of obtaining a Guardian through DSS to assist with paying rent, utilities, etc. Patient denies need for any resources stating he has no concerns with paying rent 2/1: LCSW collaborated with [employer] to obtain appropriate Assisted Living facilities, after pt agreed to services. Resources was provided via e-mail. LCSW was unable to provide update to pt, as his phone is not currently working 2/8: Patient reports home health services has began. States allegations of disrupting neighbors were made against him; however, he is unsure about assisted living. CCM LCSW continues to collaborate with Medicaid and medical team regarding appropriate patient care needs, including housing 2/23: LCSW was provided current eviction notice with upcoming court hearing scheduled for 10/05/21. LCSW has spoken with pt's Landlord and was provided time-frame of when patient would have to be out of the home, should Housing Authority's case is  awarded ?Legal Aid  referral attempted to assist with upcoming court hearing. LCSW provided patient with contact information and he agreed to call independently ?Patient is upset about the way he is treated by multiple personal aids, stating that current aid is dishonest about why she does not show to work. CCM LCSW strongly encouraged patient to contact agency about his concerns to reach a resolution 09/08: [name] is back as his guide for 27 hrs a week [place] [phone] 09/26: Patient informed CCM LCSW that [name] will provide transportation for his upcoming PCP appt scheduled 05/11/21. This information was verified by [name], pt's new aid that will be assisting with ADL's in the home on Mondays, Wednesdays, and Fridays 11/1: Patient reports that he is no longer receiving services from [facility]. He has contacted [name] and is awaiting paperwork regarding home care agencies 11/8: Patient reports inability to work with [name] and her home aids. He has contacted [facility] and requested to work with a new agency. Patient reports he has met with a new aid and will have an assessment completed within a week 11/28: Patient is not interested in ALF or SNF. He prefers to continue residing independently. CCM LCSW will follow up with pt regarding process of obtaining new aid through [facility] 11/30: Patient was strongly encouraged to re-consider ALF to provide improved socialization and support 12/22: CCM LCSW addressed reported concerns regarding bank fraud and alleged risks of eviction due to payment confusion. Patient is adamant that he is not interested in leaving current residence. States he feels safe in the home, despite reports of others having access to his home without his permission 01/11: Patient is satisfied with services provided by [facility]. LCSW informed him of his next scheduled home visit 2/23: LCSW will continue to search for safe and appropriate adult care homes for patient, due to multiple  concerns for pt's safety and ability to independently care for self. LCSW will contact DSS regarding application for State/County Special Assistance Program 2/24: LCSW spoke with Ms. Dorita Sciara with [facility] [phone] LCSW will securely email FL2 for consideration of placement 3/20: Updated FL2 provided to APS CM, Lora Havens.  ?Patient receives visits from neighbors which promotes mood ?CCM LCSW discussed CBT strategies to assist with managing symptoms  ?Patient reports recent attempt for bank fraud. The bank restored his account and are sending new cards. Patient identified strategies to prevent future scams, including, locking his belongings in a safe at home, feels previous aids were stealing his info 12/22: CCM LCSW discussed with patient the importance of contacting Law Enforcement if he has concerns for safety/fraud ?Validation and encouragement was provided during call. CCM LCSW encouraged patient to continue reflecting on past experiences and current friendships to promote positive mood and stress management. States he has enjoyed playing cards online with friends and praying to cope with stressors. 10/10 Patient shared that recent stress has been alleviated with drawing and talking with family and friends  ?CCM LCSW provided a supportive environment to allow patient to process his emotions ?Patient reports compliance with medications. Patient utilizes medicine cabinet with labels, AM and PM, to assist with medication management ?Patient reports that his strong spiritual relationship keeps him encouraged to move forward instead of focusing on past events 11/1: Patient enjoys walking to assist with stress management ?Patient reports becoming ?stir crazy? from staying at home for majority of the day. CCM LCSW discussed benefits of participating in [facility]. Patient reports hx of attending  [facility]. States that he disenrolled because staff and participants believed he was ?delusional?  because he has close relationships with various celebrities. Patient is not interested in initiating psychiatry and/or counseling to assist with strengthening support system 09/08: Patient reports that he participate

## 2021-10-31 NOTE — Telephone Encounter (Signed)
? ?  Telephone encounter was:  Successful.  ?10/31/2021 ?Name: Evan [name] MRN: [id number] DOB: [birth year] ? ?Evan [name] is a 66 y.o. year old male who is a primary care patient of [name], Henrine Screws T, NP . The community resource team was consulted for assistance with Transportation Needs  ? ?Care guide performed the following interventions: Patient provided with information about care guide support team and interviewed to confirm resource needs. Patient is now living in a family care center and a coordinator fom the center will be taking care of all the appointments and transportation needs. I will email all of his appointment so they can change appointments due to he needs someone to assist him ? ?Follow Up Plan:  No further follow up planned at this time. The patient has been provided with needed resources. ? ? ? ?Larena Sox ?Care Guide, Embedded Care Coordination ?[facility], Care Management  ?[phone] ?[address], [place], [place] [address] ?Phone: [phone] ?Email: [email or web address] ? ?  ?

## 2021-10-31 NOTE — Patient Instructions (Signed)
Visit Information ? ?Thank you for taking time to visit with me today. Please don't hesitate to contact me if I can be of assistance to you before our next scheduled telephone appointment. ? ?Following are the goals we discussed today:  ?Attend all scheduled medical appointments ?Utilize healthy coping skills and/or supportive resources discussed ?Contact PCP office with any questions or concerns ? ?Our next appointment is by telephone on 11/14/21 at 9:00 AM ? ?Please call the care guide team at [phone] if you need to cancel or reschedule your appointment.  ? ?If you are experiencing a Mental Health or [place] or need someone to talk to, please call 911  ? ?Patient verbalizes understanding of instructions and care plan provided today and agrees to view in [place]. Active MyChart status confirmed with patient.   ? ?[name], MSW, LCSW ?[facility] ?[facility] ?[email or web address] ?Phone [phone] ?6:32 AM ? ?

## 2021-10-31 NOTE — Chronic Care Management (AMB) (Deleted)
Chronic Care Management    Clinical Social Work Note  10/31/2021 Name: Evan [name] MRN: [id number] DOB: 1956-01-30  Evan [name] is a 66 y.o. year old male who is a primary care patient of [name], Dorie Rank, NP. The CCM team was consulted to assist the patient with chronic disease management and/or care coordination needs related to: Transportation Needs  and Mental Health Counseling and Resources.   Engaged with patient by telephone for follow up visit in response to provider referral for social work chronic care management and care coordination services.   Consent to Services:  The patient was given information about Chronic Care Management services, agreed to services, and gave verbal consent prior to initiation of services.  Please see initial visit note for detailed documentation.   Patient agreed to services and consent obtained.   Assessment: Review of patient past medical history, allergies, medications, and health status, including review of relevant consultants reports was performed today as part of a comprehensive evaluation and provision of chronic care management and care coordination services.     SDOH (Social Determinants of Health) assessments and interventions performed:    Advanced Directives Status: Not addressed in this encounter.  CCM Care Plan  Allergies  Allergen Reactions   Penicillin G Hives   Sulfa Antibiotics Hives   Tiotropium    Zoloft [Sertraline Hcl] Other (See Comments)    Outpatient Encounter Medications as of 10/07/2021  Medication Sig   aspirin 81 MG chewable tablet Chew 1 tablet (81 mg total) by mouth daily.   Blood Glucose Monitoring Suppl (ONETOUCH VERIO) w/Device KIT Use to check blood sugar 3 to 4 times a day and document.  Please bring to visits for review.   Cholecalciferol 125 MCG (5000 UT) TABS Take 1 tablet (5,000 Units total) by mouth daily.   empagliflozin (JARDIANCE) 10 MG TABS tablet Take 1 tablet (10 mg total) by mouth daily.    ezetimibe (ZETIA) 10 MG tablet Take 1 tablet (10 mg total) by mouth daily.   furosemide (LASIX) 40 MG tablet TAKE 2 TABLETS BY MOUTH ONCE EVERY MORNING AND 1 TABLET ONCE EVERY EVENING   glucose blood (ONETOUCH VERIO) test strip Use to check blood sugar 3 to 4 times a day.   metoprolol succinate (TOPROL XL) 50 MG 24 hr tablet Take 1 tablet (50 mg total) by mouth daily.   nitroGLYCERIN (NITROSTAT) 0.4 MG SL tablet Place 1 tablet (0.4 mg total) under the tongue every 5 (five) minutes as needed for chest pain.   ondansetron (ZOFRAN-ODT) 4 MG disintegrating tablet Take 1 tablet (4 mg total) by mouth every 8 (eight) hours as needed for nausea or vomiting.   OneTouch Delica Lancets 33G MISC Use to check blood sugar 2-3 times a day.   pantoprazole (PROTONIX) 40 MG tablet Take 1 tablet (40 mg total) by mouth daily.   risperiDONE (RISPERDAL) 0.5 MG tablet Take 1 tablet (0.5 mg total) by mouth 2 (two) times daily.   rivaroxaban (XARELTO) 20 MG TABS tablet Take 1 tablet (20 mg total) by mouth at bedtime.   Saccharomyces boulardii (PROBIOTIC) 250 MG CAPS Take 1 capsule by mouth in the morning and at bedtime.   sharps container 1 each by Does not apply route as needed.   Skin Protectants, Misc. (EUCERIN) cream Apply topically as needed for dry skin.   SYMBICORT 80-4.5 MCG/ACT inhaler INHALE 2 PUFFS BY MOUTH TWICE DAILY   triamcinolone cream (KENALOG) 0.1 % Apply 1 application topically 2 (two) times daily.  vitamin B-12 (CYANOCOBALAMIN) 1000 MCG tablet Take 1 tablet (1,000 mcg total) by mouth daily.   No facility-administered encounter medications on file as of 10/07/2021.    Patient Active Problem List   Diagnosis Date Noted   Memory changes 08/12/2021   Other thrombophilia (HCC) 12/25/2020   Vitamin D deficiency 12/04/2020   Vitamin B12 deficiency 12/04/2020   Stenosis of right carotid artery 12/03/2020   History of CVA (cerebrovascular accident) 11/06/2020   History of 2019 novel coronavirus  disease (COVID-19) 09/27/2020   Atherosclerosis of aorta (HCC) 12/04/2019   Persistent atrial fibrillation (HCC) 09/12/2019   CAD (coronary artery disease) 09/12/2019   Chronic venous stasis 09/12/2019   Hoarding disorder 09/12/2019   Cervical spinal stenosis 09/12/2019   Diabetic retinopathy (HCC) 09/12/2019   COPD, mild (HCC) 09/12/2019   Osteoporosis 09/09/2019   History of prostate cancer 09/09/2019   Paranoid schizophrenia (HCC) 08/10/2019   Obstructive sleep apnea on CPAP 08/10/2019   Hyperlipidemia associated with type 2 diabetes mellitus (HCC) 08/10/2019   Chronic diastolic CHF (congestive heart failure) (HCC) 01/09/2014   Esophageal dysmotility 09/12/2013   Type 2 diabetes mellitus with morbid obesity (HCC) 02/14/2012   Obesity, morbid (more than 100 lbs over ideal weight or BMI > 40) (HCC) 07/26/2011   OLD MYOCARDIAL INFARCTION 03/02/2010   Hypertension associated with diabetes (HCC) 03/20/2009   CORONARY ATHEROSCLEROSIS, ARTERY BYPASS GRAFT 03/20/2009    Conditions to be addressed/monitored: CAD, COPD, DMII, and Anxiety; Transportation and Housing barriers  Care Plan : General Social Work (Adult)  Updates made by Jenel Lucks D, LCSW since 10/31/2021 12:00 AM     Problem: Response to Treatment (Depression)      Long-Range Goal: Response to Treatment Maximized   Start Date: 12/27/2020  This Visit's Progress: On track  Recent Progress: On track  Priority: High  Note:   Current barriers:   Acute Mental Health needs related to Anxiety Housing barriers, Level of care concerns, and Mental Health Concerns  Needs Support, Education, and Care Coordination in order to meet unmet mental health needs. Clinical Goal(s): Over the next 120 days, patient will work with SW to reduce or manage symptoms of anxiety and increase knowledge and/or ability of: coping skills, healthy habits, self-management skills, and stress reduction.until connected for ongoing counseling.  Clinical  Interventions:  Assessed patient's previous treatment, needs, coping skills, current treatment, support system and barriers to care Provided mental health counseling with regard to managing mental health conditions Patient reports difficulty managing chronic health conditions and psychosocial stressors Concerns that rent isn't paid for. Concerns for not passing inspection and it needed to be completed 06/23/21 Misty Stanley is his leasing agent who contacted patient while CCM LCSW was on phone and shared that she will work with patient on meeting inspection. Scheduled to visit home on Tuesday, 06/28/21 11/28: Patient reports Misty Stanley has assisted him with passing the inspection 01/11: CCM LCSW discussed benefits of obtaining a Guardian through DSS to assist with paying rent, utilities, etc. Patient denies need for any resources stating he has no concerns with paying rent 2/1: LCSW collaborated with [employer] to obtain appropriate Assisted Living facilities, after pt agreed to services. Resources was provided via e-mail. LCSW was unable to provide update to pt, as his phone is not currently working 2/8: Patient reports home health services has began. States allegations of disrupting neighbors were made against him; however, he is unsure about assisted living. CCM LCSW continues to collaborate with Medicaid and medical team regarding appropriate patient  care needs, including housing 2/23: LCSW was provided current eviction notice with upcoming court hearing scheduled for 10/05/21. LCSW has spoken with pt's Landlord and was provided time-frame of when patient would have to be out of the home, should Housing Authority's case is awarded Armed forces operational officer Aid referral attempted to assist with upcoming court hearing. LCSW provided patient with contact information and he agreed to call independently Patient is upset about the way he is treated by multiple personal aids, stating that current aid is dishonest about why she does not show to  work. CCM LCSW strongly encouraged patient to contact agency about his concerns to reach a resolution 09/08: Drinda Butts is back as his guide for 27 hrs a week [facility] [phone] 09/26: Patient informed CCM LCSW that Drinda Butts will provide transportation for his upcoming PCP appt scheduled 05/11/21. This information was verified by Elita Quick, pt's new aid that will be assisting with ADL's in the home on Mondays, Wednesdays, and Fridays 11/1: Patient reports that he is no longer receiving services from [place]. He has contacted [facility] and is awaiting paperwork regarding home care agencies 11/8: Patient reports inability to work with Drinda Butts and her home aids. He has contacted [place] and requested to work with a new agency. Patient reports he has met with a new aid and will have an assessment completed within a week 11/28: Patient is not interested in ALF or SNF. He prefers to continue residing independently. CCM LCSW will follow up with pt regarding process of obtaining new aid through [facility] 11/30: Patient was strongly encouraged to re-consider ALF to provide improved socialization and support 12/22: CCM LCSW addressed reported concerns regarding bank fraud and alleged risks of eviction due to payment confusion. Patient is adamant that he is not interested in leaving current residence. States he feels safe in the home, despite reports of others having access to his home without his permission 01/11: Patient is satisfied with services provided by [facility]. LCSW informed him of his next scheduled home visit 2/23: LCSW will continue to search for safe and appropriate adult care homes for patient, due to multiple concerns for pt's safety and ability to independently care for self. LCSW will contact DSS regarding application for State/County Special Assistance Program 2/24: LCSW spoke with Ms. Harlon Flor with [employer] [phone] LCSW will securely email FL2 for  consideration of placement 3/20: Updated FL2 provided to APS CM, Santo Held.  Patient receives visits from neighbors which promotes mood CCM LCSW discussed CBT strategies to assist with managing symptoms  Patient reports recent attempt for bank fraud. The bank restored his account and are sending new cards. Patient identified strategies to prevent future scams, including, locking his belongings in a safe at home, feels previous aids were stealing his info 12/22: CCM LCSW discussed with patient the importance of contacting Law Enforcement if he has concerns for safety/fraud Validation and encouragement was provided during call. CCM LCSW encouraged patient to continue reflecting on past experiences and current friendships to promote positive mood and stress management. States he has enjoyed playing cards online with friends and praying to cope with stressors. 10/10 Patient shared that recent stress has been alleviated with drawing and talking with family and friends  CCM LCSW provided a supportive environment to allow patient to process his emotions Patient reports compliance with medications. Patient utilizes medicine cabinet with labels, AM and PM, to assist with medication management Patient reports that his strong spiritual relationship keeps him encouraged to  move forward instead of focusing on past events 11/1: Patient enjoys walking to assist with stress management Patient reports becoming "stir crazy" from staying at home for majority of the day. CCM LCSW discussed benefits of participating in [facility]. Patient reports hx of attending PACE. States that he disenrolled because staff and participants believed he was "delusional" because he has close relationships with various celebrities. Patient is not interested in initiating psychiatry and/or counseling to assist with strengthening support system 09/08: Patient reports that he participates in a group weekly to promote socialization with others  12/22: States he feels "liberated" without caregivers because he no longer has things stolen from him 2/23: LCSW completed an APS report log# [id number]  CCM LCSW discussed strategies to improve communication skills with aid to assist in preventing misunderstandings or offending others  CCM LCSW reviewed upcoming appointments with patient. He reports that he is utilizing [employer] for transportation. His aid has offered to bring patient to local stores to obtain cleaning supplies. Patient reports that he is aware that he can also utilize [employer] for transportation to medical appointments 10/10: Patient has a ride scheduled for 10/18 to take him grocery shopping 01/11: CCM LCSW completed a referral to Care Guide to assist patient with scheduling transportation to upcoming appt on 09/08/21 CCM LCSW discussed strategies to assist patient with memory concerns. Patient agreed to write notes and post them in high traffic areas of the home to remind him of upcoming appts and/or medications CCM LCSW collaborated with PCP, Research officer, political party, CCM RN, and ECM/HRMM Supervisor regarding patient care Other interventions: Solution-Focused Strategies, Mindfulness or Management consultant, Active listening / Reflection utilized , Emotional Supportive Provided, Psychoeducation for mental health needs , Motivational Interviewing, Participation in counseling encouraged , Participation in support group encouraged , Consideration of in-home help encouraged , and Verbalization of feelings encouraged   Discussed plans with patient for ongoing care management follow up and provided patient with direct contact information for care management team Collaboration with PCP regarding development and update of comprehensive plan of care as evidenced by provider attestation and co-signature Inter-disciplinary care team collaboration (see longitudinal plan of care) 08/26/21 Patient contacted to discuss options for Assisted Living. Patient  confirms continued  desire to move where he can get help with his care needs. Patient states that he is being evicted and has "5"days to move out. However patient states that his landlord will "work:" with him Placement process discussed and the fact that the majority of his income will be going to pay for his stay at the facility "I have to pay rent somewhere" Collaboration phone call to [facility]-patient's local management entity [phone] to discuss assistance with finding placement for patient, patient provided them with verbal consent to assist with his placement needs. This social worker spoke with Elease Hashimoto who place a referral to the Housing Team- they will contact this Child psychotherapist for follow up 2/23: Follow up call completed and LCSW was provided with updated listing of local Adult Care Homes Patient Goals/Self-Care Activities: Over the next 120 days Attend all scheduled appointments with providers Contact office with any questions or concerns Utilize [facility] transportation number to assist with attending medical appointments Practice relaxation or meditation daily Practice positive thinking and self-talk        Jenel Lucks, MSW, LCSW [facility] Care Management [place]  [facility] [email or web address] Phone [phone] 6:30 AM

## 2021-11-01 ENCOUNTER — Telehealth: Payer: Self-pay | Admitting: Nurse Practitioner

## 2021-11-01 NOTE — Telephone Encounter (Incomplete Revision)
Shirlee Limerick with care facility is calling in for assistance. She says that they was told by pt that he is suppose to check his blood sugar 3-4 times a day. She also said that the [facility] form they are not showing that pt is currently taking B-12. Shirlee Limerick would like to have this and a copy of his current medication list faxed to them to make sure that pt has everything that he need.  ? ?Fax: Loann QuillMadelynn Done - [phone]  ? ?Phone: [phone] ? ? ? ?

## 2021-11-01 NOTE — Telephone Encounter (Addendum)
Shirlee Limerick with care facility is calling in for assistance. She says that they was told by pt that he is suppose to check his blood sugar 3-4 times a day. She also said that the [facility] form they are not showing that pt is currently taking B-12. Shirlee Limerick would like to have this and a copy of his current medication list faxed to them to make sure that pt has everything that he need.  ? ?Fax: Loann QuillMadelynn Done - [phone]  ? ?Phone: [phone] ? ? ? ?

## 2021-11-02 NOTE — Telephone Encounter (Signed)
Medication list and blood sugar monitoring information faxed to the facility as requested.  ?

## 2021-11-03 ENCOUNTER — Emergency Department
Admission: EM | Admit: 2021-11-03 | Discharge: 2021-11-03 | Disposition: A | Discharge status: Home / Self Care | Payer: Medicare Other | Attending: Emergency Medicine | Admitting: Emergency Medicine

## 2021-11-03 ENCOUNTER — Encounter: Payer: Self-pay | Admitting: Emergency Medicine

## 2021-11-03 ENCOUNTER — Other Ambulatory Visit: Payer: Self-pay

## 2021-11-03 DIAGNOSIS — J449 Chronic obstructive pulmonary disease, unspecified: Secondary | ICD-10-CM | POA: Diagnosis not present

## 2021-11-03 DIAGNOSIS — M79605 Pain in left leg: Secondary | ICD-10-CM | POA: Diagnosis not present

## 2021-11-03 DIAGNOSIS — M79604 Pain in right leg: Secondary | ICD-10-CM | POA: Insufficient documentation

## 2021-11-03 DIAGNOSIS — R52 Pain, unspecified: Secondary | ICD-10-CM | POA: Diagnosis not present

## 2021-11-03 DIAGNOSIS — I10 Essential (primary) hypertension: Secondary | ICD-10-CM | POA: Insufficient documentation

## 2021-11-03 DIAGNOSIS — R6889 Other general symptoms and signs: Secondary | ICD-10-CM | POA: Diagnosis not present

## 2021-11-03 DIAGNOSIS — E119 Type 2 diabetes mellitus without complications: Secondary | ICD-10-CM | POA: Diagnosis not present

## 2021-11-03 DIAGNOSIS — I2510 Atherosclerotic heart disease of native coronary artery without angina pectoris: Secondary | ICD-10-CM | POA: Insufficient documentation

## 2021-11-03 DIAGNOSIS — L539 Erythematous condition, unspecified: Secondary | ICD-10-CM | POA: Diagnosis not present

## 2021-11-03 DIAGNOSIS — R42 Dizziness and giddiness: Secondary | ICD-10-CM | POA: Insufficient documentation

## 2021-11-03 DIAGNOSIS — E860 Dehydration: Secondary | ICD-10-CM | POA: Diagnosis not present

## 2021-11-03 LAB — CBC
HCT: 49.6 % (ref 39.0–52.0)
Hemoglobin: 15.3 g/dL (ref 13.0–17.0)
MCH: 28.1 pg (ref 26.0–34.0)
MCHC: 30.8 g/dL (ref 30.0–36.0)
MCV: 91.2 fL (ref 80.0–100.0)
Platelets: 256 10*3/uL (ref 150–400)
RBC: 5.44 MIL/uL (ref 4.22–5.81)
RDW: 15 % (ref 11.5–15.5)
WBC: 9.5 10*3/uL (ref 4.0–10.5)
nRBC: 0 % (ref 0.0–0.2)

## 2021-11-03 LAB — BASIC METABOLIC PANEL
Anion gap: 10 (ref 5–15)
BUN: 19 mg/dL (ref 8–23)
CO2: 30 mmol/L (ref 22–32)
Calcium: 9.1 mg/dL (ref 8.9–10.3)
Chloride: 101 mmol/L (ref 98–111)
Creatinine, Ser: 1 mg/dL (ref 0.61–1.24)
GFR, Estimated: >60 mL/min (ref >60)
Glucose, Bld: 99 mg/dL (ref 70–99)
Potassium: 3.4 mmol/L — ABNORMAL LOW (ref 3.5–5.1)
Sodium: 141 mmol/L (ref 135–145)

## 2021-11-03 LAB — URINALYSIS, ROUTINE W REFLEX MICROSCOPIC
Bilirubin Urine: NEGATIVE
Glucose, UA: NEGATIVE mg/dL
Hgb urine dipstick: NEGATIVE
Ketones, ur: NEGATIVE mg/dL
Leukocytes,Ua: NEGATIVE
Nitrite: NEGATIVE
Protein, ur: NEGATIVE mg/dL
Specific Gravity, Urine: 1.006 (ref 1.005–1.030)
pH: 7 (ref 5.0–8.0)

## 2021-11-03 NOTE — ED Provider Notes (Signed)
? ?[facility] ?Provider Note ? ? ? Event Date/Time  ? First MD Initiated Contact with Patient 11/03/21 1227   ?  (approximate) ? ? ?History  ? ?Chief Complaint ?Leg Pain ? ? ?HPI ? ?Evan [name] is a 66 y.o. male with past medical history of hypertension, hyperlipidemia, diabetes, CAD, COPD, GERD, and generalized anxiety disorder who presents to the ED complaining of dizziness and leg pain.  Patient reports that he has been dealing with intermittent vertigo for the past couple of days, states he will intermittently feel dizzy while standing up and walking with a sensation of the room spinning around him.  He describes this as similar to his chronic vertigo, states he takes medication for it which helps with the symptoms.  He additionally complains of pain in his bilateral legs, denies any trauma to either leg.  He has not noticed any swelling to the legs and denies any rash, does state he feels like the legs are discolored.  He additionally endorses dysuria for the past couple of days, denies any abdominal pain, fever, or flank pain.  ?  ? ? ?Physical Exam  ? ?Triage Vital Signs: ?ED Triage Vitals  ?Enc Vitals Group  ?   BP 11/03/21 1154 (!) 139/93  ?   Pulse Rate 11/03/21 1154 84  ?   Resp 11/03/21 1154 18  ?   Temp 11/03/21 1154 (!) 97.4 ?F (36.3 ?C)  ?   Temp Source 11/03/21 1154 Oral  ?   SpO2 11/03/21 1154 98 %  ?   Weight 11/03/21 1155 204 lb (92.5 kg)  ?   Height 11/03/21 1155 '5\' 5"'$  (1.651 m)  ?   Head Circumference --   ?   Peak Flow --   ?   Pain Score 11/03/21 1155 7  ?   Pain Loc --   ?   Pain Edu? --   ?   Excl. in [place]? --   ? ? ?Most recent vital signs: ?Vitals:  ? 11/03/21 1154  ?BP: (!) 139/93  ?Pulse: 84  ?Resp: 18  ?Temp: (!) 97.4 ?F (36.3 ?C)  ?SpO2: 98%  ? ? ?Constitutional: Alert and oriented. ?Eyes: Conjunctivae are normal. ?Head: Atraumatic. ?Nose: No congestion/rhinnorhea. ?Mouth/Throat: Mucous membranes are moist.  ?Cardiovascular: Normal rate, regular rhythm.  Grossly normal heart sounds.  2+ radial and DP pulses bilaterally. ?Respiratory: Normal respiratory effort.  No retractions. Lungs CTAB. ?Gastrointestinal: Soft and nontender. No distention. ?Musculoskeletal: No lower extremity tenderness nor edema.  ?Neurologic:  Normal speech and language. No gross focal neurologic deficits are appreciated. ? ? ? ?ED Results / Procedures / Treatments  ? ?Labs ?(all labs ordered are listed, but only abnormal results are displayed) ?Labs Reviewed  ?BASIC METABOLIC PANEL - Abnormal; Notable for the following components:  ?    Result Value  ? Potassium 3.4 (*)   ? All other components within normal limits  ?URINALYSIS, ROUTINE W REFLEX MICROSCOPIC - Abnormal; Notable for the following components:  ? Color, Urine STRAW (*)   ? APPearance CLEAR (*)   ? All other components within normal limits  ?CBC  ? ? ? ?PROCEDURES: ? ?Critical Care performed: No ? ?Procedures ? ? ?MEDICATIONS ORDERED IN ED: ?Medications - No data to display ? ? ?IMPRESSION / MDM / ASSESSMENT AND PLAN / ED COURSE  ?I reviewed the triage vital signs and the nursing notes. ?             ?               ? ?  66 y.o. male with past medical history of hypertension, hyperlipidemia, diabetes, CAD, GERD, COPD, and generalized anxiety disorder who presents to the ED with intermittent dizziness and sensation of the room spinning around him along with dysuria and bilateral leg pain. ? ?Differential diagnosis includes, but is not limited to, vertigo, stroke, orthostatic hypotension, dehydration, electrolyte abnormality, AKI, UTI, DVT. ? ?Patient is nontoxic-appearing and in no acute distress, vital signs are unremarkable and he is neurovascular intact to his bilateral lower extremities.  Vertigo appears to be a chronic complaint for this patient per chart review and he states that medication he is taking at home helps with this.  He has no focal neurologic deficits on exam and I doubt stroke.  He is neurovascular intact to his  bilateral lower extremities with no edema or tenderness to suggest DVT, no evidence of cellulitis or abscess.  UA shows no signs of infection, CBC without anemia or leukocytosis, BMP without significant electrolyte abnormality or AKI.  Given reassuring work-up and exam, patient is appropriate for discharge back to group home with PCP follow-up.  Patient agrees with plan. ? ?  ? ? ?FINAL CLINICAL IMPRESSION(S) / ED DIAGNOSES  ? ?Final diagnoses:  ?Vertigo  ? ? ? ?Rx / DC Orders  ? ?ED Discharge Orders   ? ? None  ? ?  ? ? ? ?Note:  This document was prepared using Dragon voice recognition software and may include unintentional dictation errors. ?  ?Blake Divine, MD ?11/03/21 1313 ? ?

## 2021-11-03 NOTE — ED Triage Notes (Signed)
Pt here via [facility] from an unknown facility with right elbow and leg pain. Pt states leg is having discoloration and pain, pt also thinks he is dehydrated. Pt also c/o vision blurriness. Pt states his penis is also hurting and burning for 3 days.  ? ? ?124-cbg ?156/86 ?98% RA ?85 ? ?20G LAC ?

## 2021-11-04 ENCOUNTER — Other Ambulatory Visit: Payer: Self-pay | Admitting: Nurse Practitioner

## 2021-11-04 DIAGNOSIS — F32A Depression, unspecified: Secondary | ICD-10-CM

## 2021-11-04 NOTE — Telephone Encounter (Signed)
Requested medication (s) are due for refill today: Yes ? ?Requested medication (s) are on the active medication list: No ? ?Last refill:  1 year ago ? ?Future visit scheduled: Yes ? ?Notes to clinic:  Unable to refill per protocol, last refill by another provider.  ? ? ? ? ?Requested Prescriptions  ?Pending Prescriptions Disp Refills  ? Skin Protectants, Misc. (MINERIN CREME) CREA [Pharmacy Med Name: MINERIN CREME TOP CREAM GM] 454 g 0  ?  Sig: APPLY TOPICALLY TO AFFECTED AREA(S) AS NEEDED FOR DRY SKIN  ?  ? Off-Protocol Failed - 11/04/2021 12:07 PM  ?  ?  Failed - Medication not assigned to a protocol, review manually.  ?  ?  Passed - Valid encounter within last 12 months  ?  Recent Outpatient Visits   ? ?      ? 3 weeks ago Paranoid schizophrenia ([place])  ? [place], [place] T, NP  ? 1 month ago Paranoid type schizophrenia ([place])  ? Nicol[place], Lavina T, NP  ? 2 months ago Type 2 diabetes mellitus with morbid obesity ([place])  ? [place], [place] T, NP  ? 5 months ago Type 2 diabetes mellitus with morbid obesity ([place])  ? Pal[place], [place] T, NP  ? 8 months ago Type 2 diabetes mellitus with morbid obesity ([place])  ? [facility] [place], Henrine Screws T, NP  ? ?  ?  ?Future Appointments   ? ?        ? In 6 days [name], NP [employer], [facility]  ? In 1 week [name], NP [employer], [facility]  ? In 3 months  [place], [facility]  ? ?  ? ?  ?  ? Dermatology:  Other Passed - 11/04/2021 12:07 PM  ?  ?  Passed - Valid encounter within last 12 months  ?  Recent Outpatient Visits   ? ?      ? 3 weeks ago Paranoid schizophrenia ([place])  ? [place], [place] T, NP  ? 1 month ago Paranoid type schizophrenia ([place])  ? [place], [place] T, NP  ? 2 months ago Type 2 diabetes mellitus with morbid obesity ([place])  ? [place], [place] T, NP   ? 5 months ago Type 2 diabetes mellitus with morbid obesity ([place])  ? [place], [place] T, NP  ? 8 months ago Type 2 diabetes mellitus with morbid obesity ([place])  ? [facility] [place], Henrine Screws T, NP  ? ?  ?  ?Future Appointments   ? ?        ? In 6 days [name], NP [employer], [facility]  ? In 1 week [name], NP [employer], [facility]  ? In 3 months  [place], [facility]  ? ?  ? ?  ?  ?  ? ? ? ? ?

## 2021-11-05 NOTE — Patient Instructions (Signed)

## 2021-11-08 ENCOUNTER — Telehealth: Payer: Self-pay | Admitting: *Deleted

## 2021-11-08 NOTE — Telephone Encounter (Signed)
Transition Care Management Unsuccessful Follow-up Telephone Call ? ?Date of discharge and from where:  [facility] 11-03-2021 ? ?Attempts:  1st Attempt ? ?Reason for unsuccessful TCM follow-up call:  Left voice message ? ?  ?

## 2021-11-10 ENCOUNTER — Ambulatory Visit (INDEPENDENT_AMBULATORY_CARE_PROVIDER_SITE_OTHER): Payer: Medicare Other | Admitting: Nurse Practitioner

## 2021-11-10 ENCOUNTER — Encounter: Payer: Self-pay | Admitting: Nurse Practitioner

## 2021-11-10 DIAGNOSIS — E1159 Type 2 diabetes mellitus with other circulatory complications: Secondary | ICD-10-CM | POA: Diagnosis not present

## 2021-11-10 DIAGNOSIS — E785 Hyperlipidemia, unspecified: Secondary | ICD-10-CM

## 2021-11-10 DIAGNOSIS — I152 Hypertension secondary to endocrine disorders: Secondary | ICD-10-CM

## 2021-11-10 DIAGNOSIS — G4733 Obstructive sleep apnea (adult) (pediatric): Secondary | ICD-10-CM

## 2021-11-10 DIAGNOSIS — E538 Deficiency of other specified B group vitamins: Secondary | ICD-10-CM

## 2021-11-10 DIAGNOSIS — E1169 Type 2 diabetes mellitus with other specified complication: Secondary | ICD-10-CM | POA: Diagnosis not present

## 2021-11-10 DIAGNOSIS — M810 Age-related osteoporosis without current pathological fracture: Secondary | ICD-10-CM | POA: Diagnosis not present

## 2021-11-10 DIAGNOSIS — R413 Other amnesia: Secondary | ICD-10-CM | POA: Diagnosis not present

## 2021-11-10 DIAGNOSIS — Z8673 Personal history of transient ischemic attack (TIA), and cerebral infarction without residual deficits: Secondary | ICD-10-CM

## 2021-11-10 DIAGNOSIS — I700 Atherosclerosis of aorta: Secondary | ICD-10-CM | POA: Diagnosis not present

## 2021-11-10 DIAGNOSIS — E559 Vitamin D deficiency, unspecified: Secondary | ICD-10-CM

## 2021-11-10 DIAGNOSIS — D6869 Other thrombophilia: Secondary | ICD-10-CM

## 2021-11-10 DIAGNOSIS — E11319 Type 2 diabetes mellitus with unspecified diabetic retinopathy without macular edema: Secondary | ICD-10-CM

## 2021-11-10 DIAGNOSIS — Z8546 Personal history of malignant neoplasm of prostate: Secondary | ICD-10-CM

## 2021-11-10 DIAGNOSIS — Z9989 Dependence on other enabling machines and devices: Secondary | ICD-10-CM

## 2021-11-10 DIAGNOSIS — J449 Chronic obstructive pulmonary disease, unspecified: Secondary | ICD-10-CM

## 2021-11-10 DIAGNOSIS — I5032 Chronic diastolic (congestive) heart failure: Secondary | ICD-10-CM | POA: Diagnosis not present

## 2021-11-10 DIAGNOSIS — I878 Other specified disorders of veins: Secondary | ICD-10-CM

## 2021-11-10 DIAGNOSIS — I4819 Other persistent atrial fibrillation: Secondary | ICD-10-CM | POA: Diagnosis not present

## 2021-11-10 DIAGNOSIS — F200 Paranoid schizophrenia: Secondary | ICD-10-CM

## 2021-11-10 LAB — MICROALBUMIN, URINE WAIVED
Creatinine, Urine Waived: 50 mg/dL (ref 10–300)
Microalb, Ur Waived: 10 mg/L (ref 0–19)
Microalb/Creat Ratio: <30 mg/g (ref <30)

## 2021-11-10 LAB — BAYER DCA HB A1C WAIVED: HB A1C (BAYER DCA - WAIVED): 5.7 % — ABNORMAL HIGH (ref 4.8–5.6)

## 2021-11-10 MED ORDER — TRIAMCINOLONE ACETONIDE 0.1 % EX CREA: 1.0000 "application " | TOPICAL_CREAM | Freq: Two times a day (BID) | CUTANEOUS | 0 refills | Status: DC

## 2021-11-10 MED ORDER — PSYLLIUM 58.6 % PO PACK: 1.0000 | PACK | Freq: Every day | ORAL | 12 refills | Status: DC

## 2021-11-10 MED ORDER — OLMESARTAN MEDOXOMIL 5 MG PO TABS: 5.0000 mg | ORAL_TABLET | Freq: Every day | ORAL | 4 refills | Status: DC

## 2021-11-10 MED ORDER — ALBUTEROL SULFATE HFA 108 (90 BASE) MCG/ACT IN AERS: 2.0000 | INHALATION_SPRAY | Freq: Four times a day (QID) | RESPIRATORY_TRACT | 4 refills | Status: DC | PRN

## 2021-11-10 MED ORDER — BUSPIRONE HCL 5 MG PO TABS: 5.0000 mg | ORAL_TABLET | Freq: Two times a day (BID) | ORAL | 4 refills | Status: DC

## 2021-11-10 NOTE — Assessment & Plan Note (Signed)
Refer to diabetes plan -- recommend he attain eye exam. ?

## 2021-11-10 NOTE — Assessment & Plan Note (Signed)
Chronic, ongoing.  Recommend continue 100% use of CPAP once he gets it out of old apartment. ?

## 2021-11-10 NOTE — Assessment & Plan Note (Signed)
Chronic, ongoing with A1c 5.9% today, remains stable, and urine ALB 10 and A:C <04 December 2021.  Continue ARB for kidney protection.  Avoid ACE due to COPD.  Continue current Jardiance, benefit with his HF.  CMP today.  Recommend he check BS at least 3 mornings a week at home.  Return to office in 3 months.  Continue daily medication administration at assisted living (this has been very beneficial for him) + palliative visits. ?

## 2021-11-10 NOTE — Assessment & Plan Note (Signed)
Chronic, ongoing.  Continue collaboration with cardiology and current medication regimen, including Metoprolol and Xarelto.  Labs today. ?

## 2021-11-10 NOTE — Assessment & Plan Note (Signed)
Noted on CT 10/29/17.  Continue Atorvastatin and Xarelto which offer preventative.  Recommend he consistently take his medication daily. ?

## 2021-11-10 NOTE — Assessment & Plan Note (Signed)
Chronic, ongoing.  Continue current medication regimen and adjust as needed.  Return in 3 months for visit.  Lipid panel today. ?

## 2021-11-10 NOTE — Assessment & Plan Note (Signed)
Chronic, ongoing.  Continue supplement and recheck level today. 

## 2021-11-10 NOTE — Assessment & Plan Note (Signed)
Chronic, ongoing. Continue to wear compression hose daily and monitor for wounds. ?

## 2021-11-10 NOTE — Assessment & Plan Note (Signed)
Continue collaboration with oncology and urology.  PSA annually. ?

## 2021-11-10 NOTE — Progress Notes (Signed)
? ?BP 116/72   Pulse 94   Temp 98.1 ?F (36.7 ?C) (Oral)   Ht '5\' 5"'$  (1.651 m)   Wt 206 lb 9.6 oz (93.7 kg)   SpO2 97%   BMI 34.38 kg/m?   ? ?Subjective:  ? ? Patient ID: Evan [name], male    DOB: 12/07/1955, 66 y.o.   MRN: [id number] ? ?HPI: ?JARRON CURLEY is a 66 y.o. male ? ?Chief Complaint  ?Patient presents with  ? Diabetes  ? Hypertension  ? Hyperlipidemia  ? Gastroesophageal Reflux  ? Mood  ? ?Presents with one of his caregivers from new facility he lives at. ? ?Being followed by palliative, was seen last 10/18/21.  Currently living at [facility], [phone], assisted living + has room mate.  Colletta Maryland is current Education officer, museum with [facility] [employer] [phone]. ? ?History of prostate cancer, continues to follow with oncology and last seen in July 2022. ? ?DIABETES ?Continues on Jardiance 10 MG daily.  A1c in January 5.9%. B12 recent visit was 372 and Vitamin D 22.8 on past visit -- supplements were sent for him to take.  Currently at assisted living he is getting medication provided to him by staff. ?Hypoglycemic episodes:no ?Polydipsia/polyuria: no ?Visual disturbance: no ?Chest pain: no ?Paresthesias: no ?Glucose Monitoring: no ?            Accucheck frequency: daily ?            Fasting glucose: 120-130 ?            Post prandial: ?            Evening: ?            Before meals: ?Taking Insulin?: no ?            Long acting insulin: ?            Short acting insulin: ?Blood Pressure Monitoring: not checking ?Retinal Examination: Up to Date ?Foot Exam: Up to Date ?Pneumovax: provided today ?Influenza: Up To Date ?Aspirin: yes  ?  ?HYPERTENSION / HYPERLIPIDEMIA/CHF ?Last visit with cardiology 09/13/21.  Recent EF 11/06/20 was 50 to 55%.  Saw HF Clinic on 03/31/2020, no changes made -- no recent visits.   ? ?Not using CPAP at this time, it is at his apartment and has not been able to get.  No recent NTG use.  History of 6 vessel CABG in 2009.  Has history of CVA 11/06/20 -- has not followed up with  neurology as recommended, transportation is an issue, but CCM team works with him. ? ?Currently taking ASA, Atorvastatin, Zetia, Lasix, Metoprolol, Olmesartan. ?Satisfied with current treatment? yes ?Duration of hypertension: chronic ?BP monitoring frequency: not checking ?BP range:  ?BP medication side effects: no ?Duration of hyperlipidemia: chronic ?Cholesterol medication side effects: no ?Cholesterol supplements: none ?Medication compliance: good compliance ?Aspirin: yes ?Recent stressors: no ?Recurrent headaches: no ?Visual changes: no ?Palpitations: no ?Dyspnea: no ?Chest pain: no -- denies any recent ?Lower extremity edema: no ?Dizzy/lightheaded: no  ? ?  11/10/2021  ?  9:54 AM 08/12/2021  ? 10:01 AM  ?6CIT Screen  ?What Year? 0 points 0 points  ?What month? 0 points 0 points  ?What time? 0 points 0 points  ?Count back from 20 2 points 4 points  ?Months in reverse 2 points 0 points  ?Repeat phrase 2 points 10 points  ?Total Score 6 points 14 points  ? ?ATRIAL FIBRILLATION ?Atrial fibrillation status: stable ?Satisfied with current treatment: yes  ?  Medication side effects:  no ?Medication compliance: fair compliance ?Etiology of atrial fibrillation:  ?Palpitations:  no ?Chest pain:  no ?Dyspnea on exertion:  no ?Orthopnea:  no ?Syncope:  no ?Edema: baseline ?Ventricular rate control: B-blocker ?Anti-coagulation: long acting  ? ?COPD ?Has never been a smoker -- Symbicort on board but he reports he does not consistently use this and does not use Albuterol. ?COPD status: stable ?Satisfied with current treatment?: yes ?Oxygen use: no ?Dyspnea frequency: occasional at baseline ?Cough frequency: occasional at baseline ?Rescue inhaler frequency:  rarely ?Limitation of activity: no ?Productive cough: none ?Last Spirometry: unable to follow directions to perform ?Pneumovax: Up To Date ?Influenza: Up to Date ? ?GERD ?Takes Protonix 40 MG daily. ?GERD control status: stable ?Satisfied with current treatment? yes ?Heartburn  frequency: occasional ?Medication side effects: no  ?Medication compliance: stable ?Previous GERD medications: TUMS ?Antacid use frequency: occasional ?Dysphagia: no ?Odynophagia:  no ?Hematemesis: no ?Blood in stool: no ?EGD: yes  ?  ?ANXIETY/STRESS ?Continues on Risperdal BID for mood, taking this more consistently now due to assisted living environment.  He does report being a bit more nervous lately which caused some vertigo, with getting assistance. ? ?History: When [facility] there was concern he did not take medication consistently and about his delusional behaviors, he does not want to return to [facility] as he did not like they did not "believe I knew these people and called me delusional".  Reports this hurt his feelings.  [employer] offered him coverage a few years back and then he left [facility]. ?  ?He continues to discuss his friendships with multiple different famous people -- [name] and Ivin Booty.  Has referral to psychiatry, but has not attended yet. ?Duration:stable ?Anxious mood: no  ?Excessive worrying: no ?Irritability: no  ?Sweating: no ?Nausea: no ?Palpitations:no ?Hyperventilation: no ?Panic attacks: no ?Agoraphobia: no  ?Obscessions/compulsions: yes ?Depressed mood: no ? ?  10/12/2021  ?  3:06 PM 09/20/2021  ?  9:37 AM 08/12/2021  ?  9:47 AM 05/11/2021  ? 11:45 AM 02/25/2021  ? 11:24 AM  ?Depression screen PHQ 2/9  ?Decreased Interest 2 0 0  0  ?Down, Depressed, Hopeless 0 0 0 0 0  ?PHQ - 2 Score 2 0 0 0 0  ?Altered sleeping 0 '2 1 1   '$ ?Tired, decreased energy '3 2 1 '$ 0   ?Change in appetite 0 2  0   ?Feeling bad or failure about yourself  0 1 0 0   ?Trouble concentrating 0 0 0 0   ?Moving slowly or fidgety/restless 0 1 0 0   ?Suicidal thoughts 0 0 0 0   ?PHQ-9 Score '5 8 2 1   '$ ?Difficult doing work/chores Not difficult at all Somewhat difficult Not difficult at all Not difficult at all   ? ? ?  10/12/2021  ?  3:07 PM 09/20/2021  ?  9:36 AM 08/12/2021  ?  9:48 AM 05/11/2021  ? 11:46 AM  ?GAD 7 : Generalized  Anxiety Score  ?Nervous, Anxious, on Edge 0 '1 1 1  '$ ?Control/stop worrying 0 '1 2 1  '$ ?Worry too much - different things '1 1 1 1  '$ ?Trouble relaxing 1 2 0 0  ?Restless 1 2 0 0  ?Easily annoyed or irritable 0 2 2 0  ?Afraid - awful might happen 0 0 1 1  ?Total GAD 7 Score '3 9 7 4  '$ ?Anxiety Difficulty Not difficult at all  Somewhat difficult Not difficult at all  ? ?  Relevant past medical, surgical, family and social history reviewed and updated as indicated. Interim medical history since our last visit reviewed. ?Allergies and medications reviewed and updated. ? ?Review of Systems  ?Constitutional:  Negative for activity change, diaphoresis, fatigue and fever.  ?Respiratory:  Negative for cough, chest tightness, shortness of breath and wheezing.   ?Cardiovascular:  Positive for leg swelling (improved). Negative for chest pain and palpitations.  ?Gastrointestinal: Negative.   ?Endocrine: Negative for cold intolerance, heat intolerance, polydipsia, polyphagia and polyuria.  ?Neurological: Negative.   ?Psychiatric/Behavioral:  Negative for decreased concentration, self-injury, sleep disturbance and suicidal ideas. The patient is not nervous/anxious.   ? ?Per HPI unless specifically indicated above ? ?   ?Objective:  ?  ?BP 116/72   Pulse 94   Temp 98.1 ?F (36.7 ?C) (Oral)   Ht '5\' 5"'$  (1.651 m)   Wt 206 lb 9.6 oz (93.7 kg)   SpO2 97%   BMI 34.38 kg/m?   ?Wt Readings from Last 3 Encounters:  ?11/10/21 206 lb 9.6 oz (93.7 kg)  ?11/03/21 204 lb (92.5 kg)  ?10/29/21 208 lb (94.3 kg)  ?  ?Physical Exam ?Vitals and nursing note reviewed.  ?Constitutional:   ?   General: He is awake. He is not in acute distress. ?   Appearance: He is well-developed and well-groomed. He is morbidly obese. He is not ill-appearing.  ?   Comments: Improved hygiene today, much improved with move to assisted living.  ?HENT:  ?   Head: Normocephalic and atraumatic.  ?   Right Ear: Hearing normal. No drainage.  ?   Left Ear: Hearing normal. No  drainage.  ?Eyes:  ?   General: Lids are normal.     ?   Right eye: No discharge.     ?   Left eye: No discharge.  ?   Pupils: Pupils are equal, round, and reactive to light.  ?Neck:  ?   Thyroid: No thyromegaly.  ?   Juliann Pulse

## 2021-11-10 NOTE — Assessment & Plan Note (Signed)
Acute 9 mm right thalamic infarct on 11/06/2020.  At this time continue current medication regimen and will adjust as needed.  Discussed with him stroke prevention goals.  Referral to neurology placed. ?

## 2021-11-10 NOTE — Assessment & Plan Note (Signed)
Ongoing, continue supplement and recheck level today. 

## 2021-11-10 NOTE — Assessment & Plan Note (Signed)
Since CVA, with underlying mental health disorder.  Referral to neurology placed. 6CIT = 14 and 6 today.   ?

## 2021-11-10 NOTE — Assessment & Plan Note (Signed)
On Xarelto for A-Fib, continue this regimen and check CBC every 6 months. ?

## 2021-11-10 NOTE — Assessment & Plan Note (Signed)
Chronic, stable.  Continue current inhaler regimen and adjust as needed, recommend he use inhaler daily vs as needed.  Unable to attain spirometry, he is unable to follow instructions for use.  Return in 3 months. ?

## 2021-11-10 NOTE — Assessment & Plan Note (Signed)
Chronic, ongoing.  BP much improved today with less anxiety noted.  Continue Olmesartan 5 MG daily, not on medication list and will add back on, and continue Metoprolol + Lasix.  Recommend checking BP at home a few mornings a week at home and documenting + focus on DASH diet.  Continue collaboration with cardiology.  Return in 3 months.  CMP, CBC, TSH today. ?

## 2021-11-10 NOTE — Assessment & Plan Note (Signed)
BMI 34.38 with T2DM, HTN.  Recommended eating smaller high protein, low fat meals more frequently and exercising 30 mins a day 5 times a week with a goal of 10-15lb weight loss in the next 3 months. Patient voiced their understanding and motivation to adhere to these recommendations. ? ?

## 2021-11-10 NOTE — Assessment & Plan Note (Signed)
Chronic, ongoing. Euvolemic today.  Continue collaboration with cardiology and HF clinic. Recommend he take medication consistently. ?Recommend: ?- Reminded to call for an overnight weight gain of >2 pounds or a weekly weight weight of >5 pounds ?- not adding salt to his food and has been reading food labels. Reviewed the importance of keeping daily sodium intake to '2000mg'$  daily  ?- Avoid Ibuprofen products. ?- Return in 3 months -- labs obtained today. ?

## 2021-11-10 NOTE — Assessment & Plan Note (Signed)
Chronic, ongoing with continued grandiose thought processes present, ?more schizophrenia on presentation -- high suspicion for this.  Continue current medication regimen, but add on Buspar 5 MG BID for anxiety, and adjust as needed -- may benefit change to Olanzapine in future. Is taking medications more consistently now that is in assisted living.  Continue collaboration with CCM SW.  Would benefit from psychiatry, he is agreeable to this today if someone PCP trusts, referral placed. ?

## 2021-11-10 NOTE — Assessment & Plan Note (Signed)
Ongoing, continue daily calcium and Vit D, would be cautious with bisphosphonates due to his uncontrolled GERD.  May benefit from injectable vs oral treatment, discuss further at upcoming appointments. Vit D level today and plan for repeat DEXA this year. ?

## 2021-11-11 ENCOUNTER — Encounter: Payer: Self-pay | Admitting: Nurse Practitioner

## 2021-11-11 ENCOUNTER — Other Ambulatory Visit: Payer: Self-pay | Admitting: Nurse Practitioner

## 2021-11-11 DIAGNOSIS — R7989 Other specified abnormal findings of blood chemistry: Secondary | ICD-10-CM | POA: Insufficient documentation

## 2021-11-11 LAB — COMPREHENSIVE METABOLIC PANEL
ALT: 19 IU/L (ref 0–44)
AST: 21 IU/L (ref 0–40)
Albumin/Globulin Ratio: 1.6 (ref 1.2–2.2)
Albumin: 4.3 g/dL (ref 3.8–4.8)
Alkaline Phosphatase: 96 IU/L (ref 44–121)
BUN/Creatinine Ratio: 21 (ref 10–24)
BUN: 18 mg/dL (ref 8–27)
Bilirubin Total: 0.6 mg/dL (ref 0.0–1.2)
CO2: 22 mmol/L (ref 20–29)
Calcium: 9.5 mg/dL (ref 8.6–10.2)
Chloride: 99 mmol/L (ref 96–106)
Creatinine, Ser: 0.86 mg/dL (ref 0.76–1.27)
Globulin, Total: 2.7 g/dL (ref 1.5–4.5)
Glucose: 167 mg/dL — ABNORMAL HIGH (ref 70–99)
Potassium: 3.5 mmol/L (ref 3.5–5.2)
Sodium: 138 mmol/L (ref 134–144)
Total Protein: 7 g/dL (ref 6.0–8.5)
eGFR: 95 mL/min/{1.73_m2} (ref >59)

## 2021-11-11 LAB — IRON AND TIBC
Iron Saturation: 29 % (ref 15–55)
Iron: 91 ug/dL (ref 38–169)
Total Iron Binding Capacity: 314 ug/dL (ref 250–450)
UIBC: 223 ug/dL (ref 111–343)

## 2021-11-11 LAB — CBC WITH DIFFERENTIAL/PLATELET
Basophils Absolute: 0.1 10*3/uL (ref 0.0–0.2)
Basos: 1 %
EOS (ABSOLUTE): 0.2 10*3/uL (ref 0.0–0.4)
Eos: 2 %
Hematocrit: 45.1 % (ref 37.5–51.0)
Hemoglobin: 14.8 g/dL (ref 13.0–17.7)
Immature Grans (Abs): 0 10*3/uL (ref 0.0–0.1)
Immature Granulocytes: 0 %
Lymphocytes Absolute: 1.6 10*3/uL (ref 0.7–3.1)
Lymphs: 18 %
MCH: 29 pg (ref 26.6–33.0)
MCHC: 32.8 g/dL (ref 31.5–35.7)
MCV: 88 fL (ref 79–97)
Monocytes Absolute: 0.7 10*3/uL (ref 0.1–0.9)
Monocytes: 7 %
Neutrophils Absolute: 6.4 10*3/uL (ref 1.4–7.0)
Neutrophils: 72 %
Platelets: 236 10*3/uL (ref 150–450)
RBC: 5.1 x10E6/uL (ref 4.14–5.80)
RDW: 14.1 % (ref 11.6–15.4)
WBC: 8.9 10*3/uL (ref 3.4–10.8)

## 2021-11-11 LAB — LIPID PANEL W/O CHOL/HDL RATIO
Cholesterol, Total: 263 mg/dL — ABNORMAL HIGH (ref 100–199)
HDL: 55 mg/dL (ref >39)
LDL Chol Calc (NIH): 172 mg/dL — ABNORMAL HIGH (ref 0–99)
Triglycerides: 194 mg/dL — ABNORMAL HIGH (ref 0–149)
VLDL Cholesterol Cal: 36 mg/dL (ref 5–40)

## 2021-11-11 LAB — VITAMIN D 25 HYDROXY (VIT D DEFICIENCY, FRACTURES): Vit D, 25-Hydroxy: 31.9 ng/mL (ref 30.0–100.0)

## 2021-11-11 LAB — TSH: TSH: 2.19 u[IU]/mL (ref 0.450–4.500)

## 2021-11-11 LAB — FERRITIN: Ferritin: 773 ng/mL — ABNORMAL HIGH (ref 30–400)

## 2021-11-11 LAB — VITAMIN B12: Vitamin B-12: 385 pg/mL (ref 232–1245)

## 2021-11-11 LAB — BRAIN NATRIURETIC PEPTIDE: BNP: 220.4 pg/mL — ABNORMAL HIGH (ref 0.0–100.0)

## 2021-11-11 MED ORDER — ROSUVASTATIN CALCIUM 20 MG PO TABS: 20.0000 mg | ORAL_TABLET | Freq: Every day | ORAL | 3 refills | Status: DC

## 2021-11-11 NOTE — Progress Notes (Signed)
Contacted via [place] ? ? ?Good evening Lebaron, your labs have returned: ?- Kidney function, creatinine and eGFR, remains normal, as is liver function, AST and ALT.   ?- Cholesterol levels are very elevated, we will restart statin at low dose to take along with your Zetia to help lower these and lower risk for stroke. ?- BNP is a bit elevated, we will monitor this closely with your heart failure and I recommend you follow up with Otila Kluver at the heart failure clinic. ?- Ferritin level is quite elevated, would like to recheck this next visit and may send you to hematology if it continues to be elevated. ?- Remainder of labs are all stable.  Any questions? ?Keep being amazing!!  Thank you for allowing me to participate in your care.  I appreciate you. ?Kindest regards, ?Vonita Calloway ?

## 2021-11-14 ENCOUNTER — Telehealth: Payer: Medicare Other

## 2021-11-14 ENCOUNTER — Telehealth: Payer: Self-pay | Admitting: *Deleted

## 2021-11-14 NOTE — Telephone Encounter (Signed)
?  Care Management  ? ?Follow Up Note ? ? ?11/14/2021 ?Name: Evan [name] MRN: [id number] DOB: 1955/10/05 ? ? ?Referred by: [name], NP ?Reason for referral : No chief complaint on file. ? ? ?An unsuccessful telephone outreach was attempted today. The patient was referred to the case management team for assistance with care management and care coordination.  ?This CSW was informed by person answering phone (caregiver at facility) that pt was not available at that time- pt to be reschedule.  ? ?Follow Up Plan: The care management team will reach out to the patient again over the next 10 days.  ?Eduard Clos MSW, LCSW ?Licensed Clinical Social Worker ?CCM Coverage      ?[phone]  ? ?

## 2021-11-16 ENCOUNTER — Other Ambulatory Visit: Payer: Self-pay | Admitting: Nurse Practitioner

## 2021-11-16 ENCOUNTER — Ambulatory Visit: Payer: Medicare Other | Admitting: Nurse Practitioner

## 2021-11-17 ENCOUNTER — Ambulatory Visit: Payer: Medicare Other | Admitting: Nurse Practitioner

## 2021-11-17 ENCOUNTER — Other Ambulatory Visit: Payer: Self-pay | Admitting: Nurse Practitioner

## 2021-11-17 ENCOUNTER — Encounter: Payer: Medicare Other | Admitting: Nurse Practitioner

## 2021-11-17 DIAGNOSIS — L03119 Cellulitis of unspecified part of limb: Secondary | ICD-10-CM | POA: Diagnosis not present

## 2021-11-17 DIAGNOSIS — J449 Chronic obstructive pulmonary disease, unspecified: Secondary | ICD-10-CM | POA: Diagnosis not present

## 2021-11-17 DIAGNOSIS — E119 Type 2 diabetes mellitus without complications: Secondary | ICD-10-CM | POA: Diagnosis not present

## 2021-11-17 DIAGNOSIS — I10 Essential (primary) hypertension: Secondary | ICD-10-CM | POA: Diagnosis not present

## 2021-11-17 NOTE — Telephone Encounter (Signed)
Requested Prescriptions  ?Pending Prescriptions Disp Refills  ?? SYMBICORT 80-4.5 MCG/ACT inhaler [Pharmacy Med Name: SYMBICORT 80-4.5 MCG/ACT INH AERO G] 10.2 g   ?  Sig: INHALE 2 PUFFS BY MOUTH 2 TIMES DAILY. RINSE MOUTH AFTER USE.  ?  ? Pulmonology:  Combination Products Passed - 11/17/2021 11:49 AM  ?  ?  Passed - Valid encounter within last 12 months  ?  Recent Outpatient Visits   ?      ? 1 week ago Type 2 diabetes mellitus with morbid obesity ([place])  ? [place], [place] T, NP  ? 1 month ago Paranoid schizophrenia ([place])  ? [place], Mathews T, NP  ? 1 month ago Paranoid type schizophrenia ([place])  ? [place], [place] T, NP  ? 3 months ago Type 2 diabetes mellitus with morbid obesity ([place])  ? [place], [place] T, NP  ? 6 months ago Type 2 diabetes mellitus with morbid obesity ([place])  ? [facility] [place], Henrine Screws T, NP  ?  ?  ?Future Appointments   ?        ? In 2 months [name], [name], NP [employer], [facility]  ? In 3 months  [name], [facility]  ?  ? ?  ?  ?  ? ? ?

## 2021-11-17 NOTE — Telephone Encounter (Signed)
Requested medication (s) are due for refill today: no ? ?Requested medication (s) are on the active medication list: yes ? ?Last refill:  05/28/21 ? ?Future visit scheduled: yes ? ?Notes to clinic:  Unable to refill per protocol, refill requested too soon. Refilled 05/28/21 for 90, 4 refills. ? ? ?  ?Requested Prescriptions  ?Pending Prescriptions Disp Refills  ? JARDIANCE 10 MG TABS tablet [Pharmacy Med Name: JARDIANCE 10 MG TAB] 30 tablet   ?  Sig: TAKE 1 TABLET BY MOUTH ONCE DAILY  ?  ? Endocrinology:  Diabetes - SGLT2 Inhibitors Passed - 11/16/2021 11:33 AM  ?  ?  Passed - Cr in normal range and within 360 days  ?  Creatinine  ?Date Value Ref Range Status  ?11/29/2014 1.20 mg/dL Final  ?  Comment:  ?  0.61-1.24 ?NOTE: New Reference Range ? 10/13/14 ?  ? ?Creatinine, Ser  ?Date Value Ref Range Status  ?11/10/2021 0.86 0.76 - 1.27 mg/dL Final  ?  ?  ?  ?  Passed - HBA1C is between 0 and 7.9 and within 180 days  ?  HB A1C (BAYER DCA - WAIVED)  ?Date Value Ref Range Status  ?11/10/2021 5.7 (H) 4.8 - 5.6 % Final  ?  Comment:  ?           Prediabetes: 5.7 - 6.4 ?         Diabetes: >6.4 ?         Glycemic control for adults with diabetes: <7.0 ?  ?  ?  ?  ?  Passed - eGFR in normal range and within 360 days  ?  EGFR (African American)  ?Date Value Ref Range Status  ?11/29/2014 >60  Final  ? ?GFR calc Af Wyvonnia Lora  ?Date Value Ref Range Status  ?07/08/2020 105 >59 mL/min/1.73 Final  ?  Comment:  ?  **In accordance with recommendations from the NKF-ASN Task force,** ?  [facility] is in the process of updating its eGFR calculation to the ?  2021 CKD-EPI creatinine equation that estimates kidney function ?  without a race variable. ?  ? ?EGFR (Non-African Amer.)  ?Date Value Ref Range Status  ?11/29/2014 >60  Final  ?  Comment:  ?  eGFR values <85m/min/1.73 m2 may be an indication of chronic ?kidney disease (CKD). ?Calculated eGFR is useful in patients with stable renal function. ?The eGFR calculation will not be reliable in  acutely ill patients ?when serum creatinine is changing rapidly. It is not useful in ?patients on dialysis. The eGFR calculation may not be applicable ?to patients at the low and high extremes of body sizes, pregnant ?women, and vegetarians. ?  ? ?GFR, Estimated  ?Date Value Ref Range Status  ?11/03/2021 >60 >60 mL/min Final  ?  Comment:  ?  (NOTE) ?Calculated using the CKD-EPI Creatinine Equation (2021) ?  ? ?GFR  ?Date Value Ref Range Status  ?09/12/2013 98.05 >60.00 mL/min Final  ? ?eGFR  ?Date Value Ref Range Status  ?11/10/2021 95 >59 mL/min/1.73 Final  ?  ?  ?  ?  Passed - Valid encounter within last 6 months  ?  Recent Outpatient Visits   ? ?      ? 1 week ago Type 2 diabetes mellitus with morbid obesity ([place]  ? [place], NP  ? 1 month ago Paranoid schizophrenia ([place]  ? [place], [place]  ? 1 month ago Paranoid type schizophrenia ([place]  ? [facility] [place], Henrine Screws T, NP  ? 3 months ago Type 2 diabetes mellitus with morbid obesity ([place])  ? [place], [place] T, NP  ? 6 months ago Type 2 diabetes mellitus with morbid obesity ([place])  ? [facility] [place], Henrine Screws T, NP  ? ?  ?  ?Future Appointments   ? ?        ? In 2 months [name], [name], NP [employer], [facility]  ? In 3 months  [place], [facility]  ? ?  ? ?  ?  ?  ? ? ?

## 2021-11-18 ENCOUNTER — Ambulatory Visit (INDEPENDENT_AMBULATORY_CARE_PROVIDER_SITE_OTHER): Payer: Medicare Other | Admitting: Licensed Clinical Social Worker

## 2021-11-18 DIAGNOSIS — F200 Paranoid schizophrenia: Secondary | ICD-10-CM

## 2021-11-18 DIAGNOSIS — I5032 Chronic diastolic (congestive) heart failure: Secondary | ICD-10-CM

## 2021-11-21 ENCOUNTER — Other Ambulatory Visit: Payer: Self-pay | Admitting: Nurse Practitioner

## 2021-11-21 ENCOUNTER — Telehealth: Payer: Self-pay | Admitting: Nurse Practitioner

## 2021-11-21 NOTE — Telephone Encounter (Signed)
Spoke with Abigail Butts from [employer]. Cheron Every that I would fax over patient's up to date medication list for patient. Medication list was faxed over to [facility] at [phone]. Abigail Butts verbalized understanding and has no further questions at this time.  ?

## 2021-11-21 NOTE — Chronic Care Management (AMB) (Signed)
?  Care Management  ?Collaboration  Note ? ?11/21/2021 ?Name: TORRIN CRIHFIELD MRN: [id number] DOB: 12-21-1955 ? ?CHUONG CASEBEER is a 66 y.o. year old male who is a primary care patient of [name], Henrine Screws T, NP. The CCM team was consulted reference care coordination needs for Mental Health Counseling and Resources. ? ?Assessment: Patient was not interviewed or contacted during this encounter.  ?  ?Intervention:Conducted brief assessment, recommendations and relevant information discussed.  CCM LCSW collaborated with DSS Case Manager, [place],  to assist with meeting patient's needs.  There are concerns about his mental state (increase in paranoia and hyper-focus on needing medical tx) resulting a visit to [place]; however, they do not accept his insurance. [name] is requesting PCP complete referral to [facility] and/or prescribe medication in interim to assist with management of symptoms. LCSW provided DSS CM with contact information regarding crisis intervention/mental health urgent care ? ?Follow up Plan: CCM LCSW will continue to collaborate with PCP and DSS in order to meet patient's needs . ?  ?Review of patient past medical history, allergies, medications, and health status, including review of pertinent consultant reports was performed as part of comprehensive evaluation and provision of care management/care coordination services.  ? ?[name], MSW, LCSW ?[place] [facility] ?[facility] ?[email or web address] ?Phone [phone] ?4:49 PM ? ?

## 2021-11-21 NOTE — Telephone Encounter (Signed)
Abigail Butts with [facility] is calling in to speak with CMA to go over  pt's current medications. She said that they need to verify /clarification on a few on his medications.  ? ? ? ?CB:  [phone]- ask to speak with Abigail Butts ?

## 2021-11-22 NOTE — Telephone Encounter (Signed)
Requested medication (s) are due for refill today: Xarelto filled 08/12/21 #90 with 4 refills ? ?Requested medication (s) are on the active medication list: Vit D not on medication list ? ?Last refill:   ? ?Future visit scheduled: Yes ? ?Notes to clinic:  See request. ? ? ? ?Requested Prescriptions  ?Pending Prescriptions Disp Refills  ? GNP VITAMIN D SUPER STRENGTH 125 MCG (5000 UT) TABS [Pharmacy Med Name: GNP VITAMIN D SUPER STRENGTH 125 MC] 30 tablet   ?  Sig: TAKE 1 TABLET BY MOUTH ONCE DAILY  ?  ? Endocrinology:  Vitamins - Vitamin D Supplementation 2 Failed - 11/21/2021 12:32 PM  ?  ?  Failed - Manual Review: Route requests for 50,000 IU strength to the provider  ?  ?  Passed - Ca in normal range and within 360 days  ?  Calcium  ?Date Value Ref Range Status  ?11/10/2021 9.5 8.6 - 10.2 mg/dL Final  ? ?Calcium, Total  ?Date Value Ref Range Status  ?11/29/2014 9.0 mg/dL Final  ?  Comment:  ?  8.9-10.3 ?NOTE: New Reference Range ? 10/13/14 ?  ?  ?  ?  ?  Passed - Vitamin D in normal range and within 360 days  ?  Vit D, 25-Hydroxy  ?Date Value Ref Range Status  ?11/10/2021 31.9 30.0 - 100.0 ng/mL Final  ?  Comment:  ?  Vitamin D deficiency has been defined by the Institute of ?Medicine and an Endocrine Society practice guideline as a ?level of serum 25-OH vitamin D less than 20 ng/mL (1,2). ?The Endocrine Society went on to further define vitamin D ?insufficiency as a level between 21 and 29 ng/mL (2). ?1. IOM (Institute of Medicine). 2010. Dietary reference ?   intakes for calcium and D. [place]: The ?   [employer]. ?2. Holick MF, Binkley Clint, Bischoff-Ferrari HA, et al. ?   Evaluation, treatment, and prevention of vitamin D ?   deficiency: an Endocrine Society clinical practice ?   guideline. JCEM. 2011 Jul; 96(7):1911-30. ?  ?  ?  ?  ?  Passed - Valid encounter within last 12 months  ?  Recent Outpatient Visits   ? ?      ? 1 week ago Type 2 diabetes mellitus with morbid obesity ([place])  ?  [place], [place]paugh T, NP  ? 1 month ago Paranoid schizophrenia ([place])  ? [place], [place] T, NP  ? 2 months ago Paranoid type schizophrenia [facility])  ? [place], [place] T, NP  ? 3 months ago Type 2 diabetes mellitus with morbid obesity ([place])  ? [place], Morgan T, NP  ? 6 months ago Type 2 diabetes mellitus with morbid obesity ([place])  ? [facility], Henrine Screws T, NP  ? ?  ?  ?Future Appointments   ? ?        ? In 2 months [name], [name], NP [employer], [facility]  ? In 3 months  [place], [facility]  ? ?  ? ? ?  ?  ?  ? XARELTO 20 MG TABS tablet [Pharmacy Med Name: XARELTO 20 MG TAB] 30 tablet   ?  Sig: TAKE 1 TABLET BY MOUTH AT BEDTIME  ?  ? Hematology: Anticoagulants - rivaroxaban Passed - 11/21/2021 12:32 PM  ?  ?  Passed - ALT in normal range and within 360 days  ?  ALT  ?Date Value Ref  Range Status  ?11/10/2021 19 0 - 44 IU/L Final  ? ?SGPT (ALT)  ?Date Value Ref Range Status  ?11/29/2014 40 U/L Final  ?  Comment:  ?  17-63 ?NOTE: New Reference Range ? 10/13/14 ?  ?  ?  ?  ?  Passed - AST in normal range and within 360 days  ?  AST  ?Date Value Ref Range Status  ?11/10/2021 21 0 - 40 IU/L Final  ? ?SGOT(AST)  ?Date Value Ref Range Status  ?11/29/2014 36 U/L Final  ?  Comment:  ?  15-41 ?NOTE: New Reference Range ? 10/13/14 ?  ?  ?  ?  ?  Passed - Cr in normal range and within 360 days  ?  Creatinine  ?Date Value Ref Range Status  ?11/29/2014 1.20 mg/dL Final  ?  Comment:  ?  0.61-1.24 ?NOTE: New Reference Range ? 10/13/14 ?  ? ?Creatinine, Ser  ?Date Value Ref Range Status  ?11/10/2021 0.86 0.76 - 1.27 mg/dL Final  ?  ?  ?  ?  Passed - HCT in normal range and within 360 days  ?  Hematocrit  ?Date Value Ref Range Status  ?11/10/2021 45.1 37.5 - 51.0 % Final  ?  ?  ?  ?  Passed - HGB in normal range and within 360 days  ?  Hemoglobin  ?Date Value Ref Range Status  ?11/10/2021 14.8 13.0 -  17.7 g/dL Final  ?  ?  ?  ?  Passed - PLT in normal range and within 360 days  ?  Platelets  ?Date Value Ref Range Status  ?11/10/2021 236 150 - 450 x10E3/uL Final  ?  ?  ?  ?  Passed - eGFR is 15 or above and within 360 days  ?  EGFR (African American)  ?Date Value Ref Range Status  ?11/29/2014 >60  Final  ? ?GFR calc Af Wyvonnia Lora  ?Date Value Ref Range Status  ?07/08/2020 105 >59 mL/min/1.73 Final  ?  Comment:  ?  **In accordance with recommendations from the NKF-ASN Task force,** ?  [facility] is in the process of updating its eGFR calculation to the ?  2021 CKD-EPI creatinine equation that estimates kidney function ?  without a race variable. ?  ? ?EGFR (Non-African Amer.)  ?Date Value Ref Range Status  ?11/29/2014 >60  Final  ?  Comment:  ?  eGFR values <32m/min/1.73 m2 may be an indication of chronic ?kidney disease (CKD). ?Calculated eGFR is useful in patients with stable renal function. ?The eGFR calculation will not be reliable in acutely ill patients ?when serum creatinine is changing rapidly. It is not useful in ?patients on dialysis. The eGFR calculation may not be applicable ?to patients at the low and high extremes of body sizes, pregnant ?women, and vegetarians. ?  ? ?GFR, Estimated  ?Date Value Ref Range Status  ?11/03/2021 >60 >60 mL/min Final  ?  Comment:  ?  (NOTE) ?Calculated using the CKD-EPI Creatinine Equation (2021) ?  ? ?GFR  ?Date Value Ref Range Status  ?09/12/2013 98.05 >60.00 mL/min Final  ? ?eGFR  ?Date Value Ref Range Status  ?11/10/2021 95 >59 mL/min/1.73 Final  ?  ?  ?  ?  Passed - Patient is not pregnant  ?  ?  Passed - Valid encounter within last 12 months  ?  Recent Outpatient Visits   ? ?      ? 1 week ago Type 2 diabetes mellitus with morbid obesity ([place]  ?  [place], [place] T, NP  ? 1 month ago Paranoid schizophrenia ([place])  ? [place], [place] T, NP  ? 2 months ago Paranoid type schizophrenia [facility])  ? [place],  [place] T, NP  ? 3 months ago Type 2 diabetes mellitus with morbid obesity ([place])  ? [place], Amity T, NP  ? 6 months ago Type 2 diabetes mellitus with morbid obesity ([place])  ? [facility] [place], Henrine Screws T, NP  ? ?  ?  ?Future Appointments   ? ?        ? In 2 months [name], [name], NP [facility], [facility]  ? In 3 months  [place], [facility]  ? ?  ? ? ?  ?  ?  ?Signed Prescriptions Disp Refills  ? Saccharomyces boulardii (PROBIOTIC) 250 MG CAPS 60 capsule 6  ?  Sig: TAKE 1 CAPSULE BY MOUTH EVERY MORNING AND AT BEDTIME  ?  ? Off-Protocol Failed - 11/21/2021 12:32 PM  ?  ?  Failed - Medication not assigned to a protocol, review manually.  ?  ?  Passed - Valid encounter within last 12 months  ?  Recent Outpatient Visits   ? ?      ? 1 week ago Type 2 diabetes mellitus with morbid obesity ([place])  ? [place], Delmont T, NP  ? 1 month ago Paranoid schizophrenia ([place])  ? [place], [place] T, NP  ? 2 months ago Paranoid type schizophrenia [facility])  ? [place], [place] T, NP  ? 3 months ago Type 2 diabetes mellitus with morbid obesity ([place])  ? [place], W[place]ley T, NP  ? 6 months ago Type 2 diabetes mellitus with morbid obesity ([place])  ? [facility] [place], Henrine Screws T, NP  ? ?  ?  ?Future Appointments   ? ?        ? In 2 months [name], [name], NP [facility], [facility]  ? In 3 months  [place], [facility]  ? ?  ? ? ?  ?  ?  ? ?

## 2021-11-30 ENCOUNTER — Ambulatory Visit: Payer: Medicare Other | Admitting: Licensed Clinical Social Worker

## 2021-11-30 DIAGNOSIS — I152 Hypertension secondary to endocrine disorders: Secondary | ICD-10-CM

## 2021-11-30 DIAGNOSIS — F200 Paranoid schizophrenia: Secondary | ICD-10-CM

## 2021-11-30 DIAGNOSIS — I5032 Chronic diastolic (congestive) heart failure: Secondary | ICD-10-CM

## 2021-11-30 DIAGNOSIS — M810 Age-related osteoporosis without current pathological fracture: Secondary | ICD-10-CM

## 2021-11-30 NOTE — Patient Instructions (Signed)
Visit Information ? ?Thank you for taking time to visit with me today. Please don't hesitate to contact me if I can be of assistance to you before our next scheduled telephone appointment. ? ?Following are the goals we discussed today:  ?Patient Goals/Self-Care Activities: Over the next 120 days ?Attend all scheduled appointments with providers ?Contact office with any questions or concerns ?Utilize [employer] transportation number to assist with attending medical appointments ?Practice relaxation or meditation daily ?Practice positive thinking and self-talk ? ?Our next appointment is by telephone on 12/14/21 ? ?Please call the care guide team at [phone] if you need to cancel or reschedule your appointment.  ? ?If you are experiencing a Mental Health or [place] or need someone to talk to, please call the Suicide and Crisis Lifeline: 988 ?call 911  ? ?Patient verbalizes understanding of instructions and care plan provided today and agrees to view in [place]. Active MyChart status confirmed with patient.   ? ?[name], MSW, LCSW ?[facility] ?[facility] ?[email or web address] ?Phone [phone] ?4:12 PM ?  ?

## 2021-11-30 NOTE — Chronic Care Management (AMB) (Signed)
?  Care Management  ?Collaboration  Note ? ?11/30/2021 ?Name: DAREAN ROTE MRN: [id number] DOB: 09-15-1955 ? ?Evan [name] is a 66 y.o. year old male who is a primary care patient of [name], Henrine Screws T, NP. The CCM team was consulted reference care coordination needs for Mental Health Counseling and Resources. ? ?Assessment: Patient was not interviewed or contacted during this encounter.  ?  ?Intervention:Conducted brief assessment, recommendations and relevant information discussed.  CCM LCSW collaborated with DSS Worker, [place]  to assist with meeting patient's needs.   ? ?Follow up Plan:  Follow up appt scheduled for 12/14/21 ?  ?Review of patient past medical history, allergies, medications, and health status, including review of pertinent consultant reports was performed as part of comprehensive evaluation and provision of care management/care coordination services.  ? ?[name], MSW, LCSW ?[place] [facility] ?[facility] ?[email or web address] ?Phone [phone] ?4:11 PM ? ?

## 2021-12-01 DIAGNOSIS — I10 Essential (primary) hypertension: Secondary | ICD-10-CM | POA: Diagnosis not present

## 2021-12-05 DIAGNOSIS — I10 Essential (primary) hypertension: Secondary | ICD-10-CM | POA: Diagnosis not present

## 2021-12-05 DIAGNOSIS — E559 Vitamin D deficiency, unspecified: Secondary | ICD-10-CM | POA: Diagnosis not present

## 2021-12-05 DIAGNOSIS — Z79899 Other long term (current) drug therapy: Secondary | ICD-10-CM | POA: Diagnosis not present

## 2021-12-05 DIAGNOSIS — E785 Hyperlipidemia, unspecified: Secondary | ICD-10-CM | POA: Diagnosis not present

## 2021-12-05 DIAGNOSIS — E119 Type 2 diabetes mellitus without complications: Secondary | ICD-10-CM | POA: Diagnosis not present

## 2021-12-12 ENCOUNTER — Other Ambulatory Visit: Payer: Self-pay

## 2021-12-12 ENCOUNTER — Ambulatory Visit: Payer: Medicare Other | Admitting: Podiatry

## 2021-12-12 ENCOUNTER — Encounter: Payer: Self-pay | Admitting: Emergency Medicine

## 2021-12-12 ENCOUNTER — Emergency Department (EMERGENCY_DEPARTMENT_HOSPITAL)
Admission: EM | Admit: 2021-12-12 | Discharge: 2021-12-13 | Disposition: A | Discharge status: Other Acute Inpatient Hospital | Payer: Medicare Other | Source: Home / Self Care | Attending: Emergency Medicine | Admitting: Emergency Medicine

## 2021-12-12 DIAGNOSIS — Z20822 Contact with and (suspected) exposure to covid-19: Secondary | ICD-10-CM | POA: Insufficient documentation

## 2021-12-12 DIAGNOSIS — I2510 Atherosclerotic heart disease of native coronary artery without angina pectoris: Secondary | ICD-10-CM | POA: Diagnosis present

## 2021-12-12 DIAGNOSIS — E559 Vitamin D deficiency, unspecified: Secondary | ICD-10-CM | POA: Diagnosis present

## 2021-12-12 DIAGNOSIS — F29 Unspecified psychosis not due to a substance or known physiological condition: Secondary | ICD-10-CM | POA: Insufficient documentation

## 2021-12-12 DIAGNOSIS — F423 Hoarding disorder: Secondary | ICD-10-CM | POA: Diagnosis present

## 2021-12-12 DIAGNOSIS — E119 Type 2 diabetes mellitus without complications: Secondary | ICD-10-CM | POA: Insufficient documentation

## 2021-12-12 DIAGNOSIS — Z8546 Personal history of malignant neoplasm of prostate: Secondary | ICD-10-CM | POA: Insufficient documentation

## 2021-12-12 DIAGNOSIS — Y900 Blood alcohol level of less than 20 mg/100 ml: Secondary | ICD-10-CM | POA: Insufficient documentation

## 2021-12-12 DIAGNOSIS — E1159 Type 2 diabetes mellitus with other circulatory complications: Secondary | ICD-10-CM | POA: Diagnosis present

## 2021-12-12 DIAGNOSIS — I10 Essential (primary) hypertension: Secondary | ICD-10-CM | POA: Insufficient documentation

## 2021-12-12 DIAGNOSIS — J449 Chronic obstructive pulmonary disease, unspecified: Secondary | ICD-10-CM | POA: Insufficient documentation

## 2021-12-12 DIAGNOSIS — Z8673 Personal history of transient ischemic attack (TIA), and cerebral infarction without residual deficits: Secondary | ICD-10-CM

## 2021-12-12 DIAGNOSIS — I6521 Occlusion and stenosis of right carotid artery: Secondary | ICD-10-CM | POA: Diagnosis present

## 2021-12-12 DIAGNOSIS — I700 Atherosclerosis of aorta: Secondary | ICD-10-CM | POA: Diagnosis present

## 2021-12-12 DIAGNOSIS — F200 Paranoid schizophrenia: Secondary | ICD-10-CM | POA: Diagnosis not present

## 2021-12-12 DIAGNOSIS — I152 Hypertension secondary to endocrine disorders: Secondary | ICD-10-CM | POA: Diagnosis present

## 2021-12-12 DIAGNOSIS — R42 Dizziness and giddiness: Secondary | ICD-10-CM | POA: Diagnosis not present

## 2021-12-12 DIAGNOSIS — I878 Other specified disorders of veins: Secondary | ICD-10-CM | POA: Diagnosis present

## 2021-12-12 DIAGNOSIS — Z046 Encounter for general psychiatric examination, requested by authority: Secondary | ICD-10-CM | POA: Insufficient documentation

## 2021-12-12 LAB — COMPREHENSIVE METABOLIC PANEL
ALT: 18 U/L (ref 0–44)
AST: 19 U/L (ref 15–41)
Albumin: 3.6 g/dL (ref 3.5–5.0)
Alkaline Phosphatase: 61 U/L (ref 38–126)
Anion gap: 5 (ref 5–15)
BUN: 24 mg/dL — ABNORMAL HIGH (ref 8–23)
CO2: 27 mmol/L (ref 22–32)
Calcium: 9.2 mg/dL (ref 8.9–10.3)
Chloride: 107 mmol/L (ref 98–111)
Creatinine, Ser: 1.04 mg/dL (ref 0.61–1.24)
GFR, Estimated: >60 mL/min (ref >60)
Glucose, Bld: 114 mg/dL — ABNORMAL HIGH (ref 70–99)
Potassium: 3.9 mmol/L (ref 3.5–5.1)
Sodium: 139 mmol/L (ref 135–145)
Total Bilirubin: 0.7 mg/dL (ref 0.3–1.2)
Total Protein: 6.7 g/dL (ref 6.5–8.1)

## 2021-12-12 LAB — URINE DRUG SCREEN, QUALITATIVE (ARMC ONLY)
Amphetamines, Ur Screen: NOT DETECTED
Barbiturates, Ur Screen: NOT DETECTED
Benzodiazepine, Ur Scrn: NOT DETECTED
Cannabinoid 50 Ng, Ur ~~LOC~~: NOT DETECTED
Cocaine Metabolite,Ur ~~LOC~~: NOT DETECTED
MDMA (Ecstasy)Ur Screen: NOT DETECTED
Methadone Scn, Ur: NOT DETECTED
Opiate, Ur Screen: NOT DETECTED
Phencyclidine (PCP) Ur S: NOT DETECTED
Tricyclic, Ur Screen: NOT DETECTED

## 2021-12-12 LAB — ACETAMINOPHEN LEVEL: Acetaminophen (Tylenol), Serum: <10 ug/mL — ABNORMAL LOW (ref 10–30)

## 2021-12-12 LAB — CBC
HCT: 42.4 % (ref 39.0–52.0)
Hemoglobin: 13.5 g/dL (ref 13.0–17.0)
MCH: 29.6 pg (ref 26.0–34.0)
MCHC: 31.8 g/dL (ref 30.0–36.0)
MCV: 93 fL (ref 80.0–100.0)
Platelets: 231 10*3/uL (ref 150–400)
RBC: 4.56 MIL/uL (ref 4.22–5.81)
RDW: 14.8 % (ref 11.5–15.5)
WBC: 10.1 10*3/uL (ref 4.0–10.5)
nRBC: 0 % (ref 0.0–0.2)

## 2021-12-12 LAB — RESP PANEL BY RT-PCR (FLU A&B, COVID) ARPGX2
Influenza A by PCR: NEGATIVE
Influenza B by PCR: NEGATIVE
SARS Coronavirus 2 by RT PCR: NEGATIVE

## 2021-12-12 LAB — ETHANOL: Alcohol, Ethyl (B): <10 mg/dL (ref <10)

## 2021-12-12 LAB — SALICYLATE LEVEL: Salicylate Lvl: <7 mg/dL — ABNORMAL LOW (ref 7.0–30.0)

## 2021-12-12 MED ORDER — EMPAGLIFLOZIN 10 MG PO TABS
10.0000 mg | ORAL_TABLET | Freq: Every day | ORAL | Status: DC
  Administered 2021-12-13: 10 mg via ORAL
  Filled 2021-12-12: qty 1

## 2021-12-12 MED ORDER — EZETIMIBE 10 MG PO TABS
10.0000 mg | ORAL_TABLET | Freq: Every day | ORAL | Status: DC
  Administered 2021-12-13: 10 mg via ORAL
  Filled 2021-12-12: qty 1

## 2021-12-12 MED ORDER — METOPROLOL SUCCINATE ER 50 MG PO TB24
50.0000 mg | ORAL_TABLET | Freq: Every day | ORAL | Status: DC
  Administered 2021-12-13: 50 mg via ORAL
  Filled 2021-12-12: qty 1

## 2021-12-12 MED ORDER — PANTOPRAZOLE SODIUM 40 MG PO TBEC
40.0000 mg | DELAYED_RELEASE_TABLET | Freq: Every day | ORAL | Status: DC
  Administered 2021-12-13: 40 mg via ORAL
  Filled 2021-12-12: qty 1

## 2021-12-12 MED ORDER — FUROSEMIDE 40 MG PO TABS
40.0000 mg | ORAL_TABLET | Freq: Two times a day (BID) | ORAL | Status: DC
  Administered 2021-12-13: 40 mg via ORAL
  Filled 2021-12-12: qty 1

## 2021-12-12 MED ORDER — ASPIRIN 81 MG PO CHEW
81.0000 mg | CHEWABLE_TABLET | Freq: Every day | ORAL | Status: DC
  Administered 2021-12-13: 81 mg via ORAL
  Filled 2021-12-12: qty 1

## 2021-12-12 MED ORDER — BUSPIRONE HCL 5 MG PO TABS
5.0000 mg | ORAL_TABLET | Freq: Two times a day (BID) | ORAL | Status: DC
  Administered 2021-12-12 – 2021-12-13 (×2): 5 mg via ORAL
  Filled 2021-12-12 (×2): qty 1

## 2021-12-12 MED ORDER — MOMETASONE FURO-FORMOTEROL FUM 100-5 MCG/ACT IN AERO
2.0000 | INHALATION_SPRAY | Freq: Two times a day (BID) | RESPIRATORY_TRACT | Status: DC
  Administered 2021-12-13: 2 via RESPIRATORY_TRACT
  Filled 2021-12-12: qty 8.8

## 2021-12-12 MED ORDER — RIVAROXABAN 20 MG PO TABS
20.0000 mg | ORAL_TABLET | Freq: Every day | ORAL | Status: DC
  Administered 2021-12-12: 20 mg via ORAL
  Filled 2021-12-12: qty 1

## 2021-12-12 MED ORDER — RISPERIDONE 1 MG PO TABS
0.5000 mg | ORAL_TABLET | Freq: Two times a day (BID) | ORAL | Status: DC
  Administered 2021-12-12 – 2021-12-13 (×2): 0.5 mg via ORAL
  Filled 2021-12-12 (×2): qty 1

## 2021-12-12 MED ORDER — ALBUTEROL SULFATE (2.5 MG/3ML) 0.083% IN NEBU: 2.5000 mg | INHALATION_SOLUTION | Freq: Four times a day (QID) | RESPIRATORY_TRACT | Status: DC | PRN

## 2021-12-12 NOTE — ED Notes (Signed)
INVOLUNTARY with allm papers on chart/awaiting TTS/PSYCH consult ?

## 2021-12-12 NOTE — BH Assessment (Addendum)
Referral information for Psychiatric Hospitalization faxed to: ? ?[facility] [phone]) No beds available at this time. ? ?[facility] [phone]),  ? ?[facility] [phone] -[phone]) [phone] ? ?[phone], ? ?[facility]([phone] [phone],  ? ?

## 2021-12-12 NOTE — ED Notes (Signed)
Psych NP provider and TTS at bedside.  ?

## 2021-12-12 NOTE — ED Notes (Signed)
Pt received dinner tray and ginger ale. Pt calm and corporative at this time.  ?

## 2021-12-12 NOTE — ED Provider Notes (Signed)
? ?[facility] ?Provider Note ? ? ? Event Date/Time  ? First MD Initiated Contact with Patient 12/12/21 1702   ?  (approximate) ? ? ?History  ? ?Psychiatric Evaluation ? ? ?HPI ? ?Evan [name] is a 66 y.o. male  who, per family medicine note dated 11/10/2021 has history of paranoid schizophrenia, who presents to the emergency department today under IVC because of concern for paranoia and a suicide attempt. The patient states that he got upset today because he does not like his roommate.  He says that he got so upset that he did sprayed deodorant in his face.  He he however denies that he was really trying to hurt himself.  He also talks about his relationship to Lemmie Evens the [employer] actor and various other actors/actresses.  ? ?  ? ? ?Physical Exam  ? ?Triage Vital Signs: ?ED Triage Vitals  ?Enc Vitals Group  ?   BP 12/12/21 1724 (!) 102/48  ?   Pulse Rate 12/12/21 1724 68  ?   Resp 12/12/21 1724 18  ?   Temp 12/12/21 1724 98.6 ?F (37 ?C)  ?   Temp Source 12/12/21 1724 Oral  ?   SpO2 12/12/21 1724 99 %  ?   Weight 12/12/21 1725 220 lb (99.8 kg)  ?   Height 12/12/21 1725 '5\' 5"'$  (1.651 m)  ?   Head Circumference --   ?   Peak Flow --   ?   Pain Score 12/12/21 1724 4  ? ?Most recent vital signs: ?Vitals:  ? 12/12/21 1724  ?BP: (!) 102/48  ?Pulse: 68  ?Resp: 18  ?Temp: 98.6 ?F (37 ?C)  ?SpO2: 99%  ? ? ?General: Awake, alert. ?CV:  Good peripheral perfusion. Regular rate and rhythm. ?Resp:  Normal effort. Lungs clear. ?Abd:  No distention.  ?Psych:  Somewhat pressured speech.  ? ? ?ED Results / Procedures / Treatments  ? ?Labs ?(all labs ordered are listed, but only abnormal results are displayed) ?Labs Reviewed  ?COMPREHENSIVE METABOLIC PANEL - Abnormal; Notable for the following components:  ?    Result Value  ? Glucose, Bld 114 (*)   ? BUN 24 (*)   ? All other components within normal limits  ?SALICYLATE LEVEL - Abnormal; Notable for the following components:  ? Salicylate Lvl <7.8 (*)    ? All other components within normal limits  ?ACETAMINOPHEN LEVEL - Abnormal; Notable for the following components:  ? Acetaminophen (Tylenol), Serum <10 (*)   ? All other components within normal limits  ?RESP PANEL BY RT-PCR (FLU A&B, COVID) ARPGX2  ?ETHANOL  ?CBC  ?URINE DRUG SCREEN, QUALITATIVE ([facility] ONLY)  ? ? ? ?EKG ? ?None ? ? ?RADIOLOGY ?None ? ? ?PROCEDURES: ? ?Critical Care performed: No ? ?Procedures ? ? ?MEDICATIONS ORDERED IN ED: ?Medications - No data to display ? ? ?IMPRESSION / MDM / ASSESSMENT AND PLAN / ED COURSE  ?I reviewed the triage vital signs and the nursing notes. ?             ?               ? ?Differential diagnosis includes, but is not limited to, paranoid schizophrenia. ? ?Patient presents to the emergency department under IVC.  During my exam he does make many claims that are hard to believe.  He denies any current SI however we will plan on continuing IVC and having psychiatry evaluate. ? ?The patient has been placed in psychiatric  observation due to the need to provide a safe environment for the patient while obtaining psychiatric consultation and evaluation, as well as ongoing medical and medication management to treat the patient's condition.  The patient has been placed under full IVC at this time. ? ? ?FINAL CLINICAL IMPRESSION(S) / ED DIAGNOSES  ? ?Final diagnoses:  ?Psychosis, unspecified psychosis type ([place])  ? ? ?Note:  This document was prepared using Dragon voice recognition software and may include unintentional dictation errors. ? ?  ?Nance Pear, MD ?12/12/21 1956 ? ?
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
## 2021-12-12 NOTE — ED Notes (Signed)

## 2021-12-12 NOTE — ED Notes (Signed)
Patient returned to room. 

## 2021-12-12 NOTE — TOC Initial Note (Signed)
Transition of Care (TOC) - Initial/Assessment Note  ? ? ?Patient Details  ?Name: Evan [name] ?MRN: [id number] ?Date of Birth: 1955/11/03 ? ?Transition of Care (TOC) CM/SW Contact:    ?Shelbie Hutching, RN ?Phone Number: ?12/12/2021, 4:34 PM ? ?Clinical Narrative:                 ?Patient coming from one of [facility] on [address] in [place]- [phone], Madelynn Done has given a 30 day notice of discharge to the patient because he refuses to take medication for mental illness and he continuously calls 911 and EMS for benign miscellaneous complaints.  Patient today per DSS SW Dellia Nims- [phone],  tried to commit suicide by spraying Dove Deoderant in his face and mouth, he is paranoid and thinks the group home is stealing from him.  [name] knows that she is going to have to find alternative placement for patient but is hoping that psychiatry can start him on some medications for symptoms control and management. ? ?TOC will follow up tomorrow.   Patient is just now in the waiting room.   ? ?  ?  ? ? ?Patient Goals and CMS Choice ?  ?  ?  ? ?Expected Discharge Plan and Services ?  ?  ?  ?  ?  ?                ?  ?  ?  ?  ?  ?  ?  ?  ?  ?  ? ?Prior Living Arrangements/Services ?  ?  ?  ?       ?  ?  ?  ?  ? ?Activities of Daily Living ?  ?  ? ?Permission Sought/Granted ?  ?  ?   ?   ?   ?   ? ?Emotional Assessment ?  ?  ?  ?  ?  ?  ? ?Admission diagnosis:  No admission diagnoses are documented for this encounter. ?Patient Active Problem List  ? Diagnosis Date Noted  ? Elevated ferritin level 11/11/2021  ? Memory changes 08/12/2021  ? Other thrombophilia ([place]) 12/25/2020  ? Vitamin D deficiency 12/04/2020  ? Vitamin B12 deficiency 12/04/2020  ? Stenosis of right carotid artery 12/03/2020  ? History of CVA (cerebrovascular accident) 11/06/2020  ? History of 2019 novel coronavirus disease (COVID-19) 09/27/2020  ? Atherosclerosis of aorta ([place]) 12/04/2019  ? Persistent atrial fibrillation  ([place]) 09/12/2019  ? CAD (coronary artery disease) 09/12/2019  ? Chronic venous stasis 09/12/2019  ? Hoarding disorder 09/12/2019  ? Cervical spinal stenosis 09/12/2019  ? Diabetic retinopathy ([place]) 09/12/2019  ? COPD, mild ([place]) 09/12/2019  ? Osteoporosis 09/09/2019  ? History of prostate cancer 09/09/2019  ? Paranoid schizophrenia ([place]) 08/10/2019  ? Obstructive sleep apnea on CPAP 08/10/2019  ? Hyperlipidemia associated with type 2 diabetes mellitus ([place]) 08/10/2019  ? Chronic diastolic CHF (congestive heart failure) ([place]) 01/09/2014  ? Esophageal dysmotility 09/12/2013  ? Type 2 diabetes mellitus with morbid obesity ([place]) 02/14/2012  ? Obesity, morbid (more than 100 lbs over ideal weight or BMI > 40) ([place]) 07/26/2011  ? OLD MYOCARDIAL INFARCTION 03/02/2010  ? Hypertension associated with diabetes ([place]) 03/20/2009  ? CORONARY ATHEROSCLEROSIS, ARTERY BYPASS GRAFT 03/20/2009  ? ?PCP:  [name], NP ?Pharmacy:   ?[facility], [place] ?[address] ?[place] [address] ?Phone: [phone] Fax: [phone] ? ?[facility], [place]  [address] ?[address] ?[place] [address] ?Phone: [phone] Fax: [phone] ? ? ? ? ?Social Determinants of Health (SDOH) Interventions ?  ? ?Readmission Risk Interventions ?   ? View : No data to display.  ?  ?  ?  ? ? ? ?
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
## 2021-12-12 NOTE — ED Notes (Signed)
Patient up walking to bathroom with no assistance.  ?
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
## 2021-12-13 ENCOUNTER — Inpatient Hospital Stay
Admission: AD | Admit: 2021-12-13 | Discharge: 2021-12-16 | DRG: 885 | Disposition: A | Discharge status: Home / Self Care | Payer: Medicare Other | Source: Intra-hospital | Attending: Psychiatry | Admitting: Psychiatry

## 2021-12-13 ENCOUNTER — Encounter: Payer: Self-pay | Admitting: Psychiatry

## 2021-12-13 DIAGNOSIS — F203 Undifferentiated schizophrenia: Secondary | ICD-10-CM | POA: Diagnosis not present

## 2021-12-13 DIAGNOSIS — J449 Chronic obstructive pulmonary disease, unspecified: Secondary | ICD-10-CM | POA: Diagnosis present

## 2021-12-13 DIAGNOSIS — E1169 Type 2 diabetes mellitus with other specified complication: Secondary | ICD-10-CM | POA: Diagnosis present

## 2021-12-13 DIAGNOSIS — Z83438 Family history of other disorder of lipoprotein metabolism and other lipidemia: Secondary | ICD-10-CM | POA: Diagnosis not present

## 2021-12-13 DIAGNOSIS — E11319 Type 2 diabetes mellitus with unspecified diabetic retinopathy without macular edema: Secondary | ICD-10-CM | POA: Diagnosis present

## 2021-12-13 DIAGNOSIS — I252 Old myocardial infarction: Secondary | ICD-10-CM

## 2021-12-13 DIAGNOSIS — F200 Paranoid schizophrenia: Secondary | ICD-10-CM | POA: Diagnosis present

## 2021-12-13 DIAGNOSIS — Z955 Presence of coronary angioplasty implant and graft: Secondary | ICD-10-CM | POA: Diagnosis not present

## 2021-12-13 DIAGNOSIS — Z8249 Family history of ischemic heart disease and other diseases of the circulatory system: Secondary | ICD-10-CM | POA: Diagnosis not present

## 2021-12-13 DIAGNOSIS — F70 Mild intellectual disabilities: Secondary | ICD-10-CM | POA: Diagnosis present

## 2021-12-13 DIAGNOSIS — F4310 Post-traumatic stress disorder, unspecified: Secondary | ICD-10-CM | POA: Diagnosis present

## 2021-12-13 DIAGNOSIS — Z20822 Contact with and (suspected) exposure to covid-19: Secondary | ICD-10-CM | POA: Diagnosis present

## 2021-12-13 DIAGNOSIS — Z6836 Body mass index (BMI) 36.0-36.9, adult: Secondary | ICD-10-CM | POA: Diagnosis not present

## 2021-12-13 DIAGNOSIS — E785 Hyperlipidemia, unspecified: Secondary | ICD-10-CM | POA: Diagnosis present

## 2021-12-13 DIAGNOSIS — Z8616 Personal history of COVID-19: Secondary | ICD-10-CM

## 2021-12-13 DIAGNOSIS — G4733 Obstructive sleep apnea (adult) (pediatric): Secondary | ICD-10-CM | POA: Diagnosis present

## 2021-12-13 DIAGNOSIS — F209 Schizophrenia, unspecified: Secondary | ICD-10-CM | POA: Diagnosis present

## 2021-12-13 DIAGNOSIS — I152 Hypertension secondary to endocrine disorders: Secondary | ICD-10-CM | POA: Diagnosis present

## 2021-12-13 DIAGNOSIS — E1151 Type 2 diabetes mellitus with diabetic peripheral angiopathy without gangrene: Secondary | ICD-10-CM | POA: Diagnosis present

## 2021-12-13 DIAGNOSIS — K219 Gastro-esophageal reflux disease without esophagitis: Secondary | ICD-10-CM | POA: Diagnosis present

## 2021-12-13 DIAGNOSIS — Z8673 Personal history of transient ischemic attack (TIA), and cerebral infarction without residual deficits: Secondary | ICD-10-CM | POA: Diagnosis not present

## 2021-12-13 DIAGNOSIS — I2510 Atherosclerotic heart disease of native coronary artery without angina pectoris: Secondary | ICD-10-CM | POA: Diagnosis present

## 2021-12-13 DIAGNOSIS — E1159 Type 2 diabetes mellitus with other circulatory complications: Secondary | ICD-10-CM | POA: Diagnosis present

## 2021-12-13 DIAGNOSIS — Z951 Presence of aortocoronary bypass graft: Secondary | ICD-10-CM

## 2021-12-13 LAB — URINALYSIS, COMPLETE (UACMP) WITH MICROSCOPIC
Bacteria, UA: NONE SEEN
Bilirubin Urine: NEGATIVE
Glucose, UA: >=500 mg/dL — AB
Hgb urine dipstick: NEGATIVE
Ketones, ur: NEGATIVE mg/dL
Leukocytes,Ua: NEGATIVE
Nitrite: NEGATIVE
Protein, ur: NEGATIVE mg/dL
Specific Gravity, Urine: 1.013 (ref 1.005–1.030)
Squamous Epithelial / HPF: NONE SEEN (ref 0–5)
pH: 6 (ref 5.0–8.0)

## 2021-12-13 LAB — GLUCOSE, CAPILLARY
Glucose-Capillary: 152 mg/dL — ABNORMAL HIGH (ref 70–99)
Glucose-Capillary: 193 mg/dL — ABNORMAL HIGH (ref 70–99)

## 2021-12-13 MED ORDER — PSYLLIUM 95 % PO PACK
1.0000 | PACK | Freq: Every day | ORAL | Status: DC
  Administered 2021-12-14 – 2021-12-16 (×3): 1 via ORAL
  Filled 2021-12-13 (×3): qty 1

## 2021-12-13 MED ORDER — FUROSEMIDE 80 MG PO TABS
80.0000 mg | ORAL_TABLET | Freq: Every day | ORAL | Status: DC
  Administered 2021-12-14 – 2021-12-16 (×3): 80 mg via ORAL
  Filled 2021-12-13 (×3): qty 1

## 2021-12-13 MED ORDER — MAGNESIUM HYDROXIDE 400 MG/5ML PO SUSP: 30.0000 mL | Freq: Every day | ORAL | Status: DC | PRN

## 2021-12-13 MED ORDER — ACETAMINOPHEN 325 MG PO TABS
650.0000 mg | ORAL_TABLET | Freq: Four times a day (QID) | ORAL | Status: DC | PRN
  Administered 2021-12-15 – 2021-12-16 (×2): 650 mg via ORAL
  Filled 2021-12-13 (×2): qty 2

## 2021-12-13 MED ORDER — VITAMIN B-12 1000 MCG PO TABS
1000.0000 ug | ORAL_TABLET | Freq: Every day | ORAL | Status: DC
  Administered 2021-12-14 – 2021-12-16 (×3): 1000 ug via ORAL
  Filled 2021-12-13 (×3): qty 1

## 2021-12-13 MED ORDER — IRBESARTAN 75 MG PO TABS
37.5000 mg | ORAL_TABLET | Freq: Every day | ORAL | Status: DC
  Administered 2021-12-14 – 2021-12-16 (×3): 37.5 mg via ORAL
  Filled 2021-12-13 (×3): qty 0.5

## 2021-12-13 MED ORDER — METOPROLOL SUCCINATE ER 25 MG PO TB24
50.0000 mg | ORAL_TABLET | Freq: Every day | ORAL | Status: DC
  Administered 2021-12-14 – 2021-12-16 (×3): 50 mg via ORAL
  Filled 2021-12-13 (×3): qty 2

## 2021-12-13 MED ORDER — NYSTATIN 100000 UNIT/GM EX POWD
Freq: Two times a day (BID) | CUTANEOUS | Status: DC
  Filled 2021-12-13: qty 15

## 2021-12-13 MED ORDER — RIVAROXABAN 20 MG PO TABS
20.0000 mg | ORAL_TABLET | Freq: Every day | ORAL | Status: DC
  Administered 2021-12-13 – 2021-12-15 (×3): 20 mg via ORAL
  Filled 2021-12-13 (×5): qty 1

## 2021-12-13 MED ORDER — EZETIMIBE 10 MG PO TABS
10.0000 mg | ORAL_TABLET | Freq: Every day | ORAL | Status: DC
  Administered 2021-12-14 – 2021-12-16 (×3): 10 mg via ORAL
  Filled 2021-12-13 (×3): qty 1

## 2021-12-13 MED ORDER — INSULIN ASPART 100 UNIT/ML IJ SOLN
0.0000 [IU] | Freq: Three times a day (TID) | INTRAMUSCULAR | Status: DC
  Administered 2021-12-14 – 2021-12-16 (×5): 1 [IU] via SUBCUTANEOUS
  Filled 2021-12-13 (×5): qty 1

## 2021-12-13 MED ORDER — ROSUVASTATIN CALCIUM 5 MG PO TABS
5.0000 mg | ORAL_TABLET | Freq: Every day | ORAL | Status: DC
  Administered 2021-12-14 – 2021-12-16 (×3): 5 mg via ORAL
  Filled 2021-12-13 (×3): qty 1

## 2021-12-13 MED ORDER — FUROSEMIDE 40 MG PO TABS
40.0000 mg | ORAL_TABLET | Freq: Every evening | ORAL | Status: DC
  Administered 2021-12-13 – 2021-12-14 (×2): 40 mg via ORAL
  Filled 2021-12-13 (×4): qty 1

## 2021-12-13 MED ORDER — ACETAMINOPHEN 325 MG PO TABS
650.0000 mg | ORAL_TABLET | Freq: Once | ORAL | Status: AC
  Administered 2021-12-13: 650 mg via ORAL
  Filled 2021-12-13: qty 2

## 2021-12-13 MED ORDER — NITROGLYCERIN 0.4 MG SL SUBL: 0.4000 mg | SUBLINGUAL_TABLET | SUBLINGUAL | Status: DC | PRN

## 2021-12-13 MED ORDER — ALUM & MAG HYDROXIDE-SIMETH 200-200-20 MG/5ML PO SUSP: 30.0000 mL | ORAL | Status: DC | PRN

## 2021-12-13 MED ORDER — PANTOPRAZOLE SODIUM 40 MG PO TBEC
40.0000 mg | DELAYED_RELEASE_TABLET | Freq: Every day | ORAL | Status: DC
Start: 2021-12-14 — End: 2021-12-16
  Administered 2021-12-14 – 2021-12-16 (×3): 40 mg via ORAL
  Filled 2021-12-13 (×3): qty 1

## 2021-12-13 MED ORDER — ASPIRIN EC 81 MG PO TBEC
81.0000 mg | DELAYED_RELEASE_TABLET | Freq: Every day | ORAL | Status: DC
  Administered 2021-12-14 – 2021-12-16 (×3): 81 mg via ORAL
  Filled 2021-12-13 (×3): qty 1

## 2021-12-13 MED ORDER — ALBUTEROL SULFATE HFA 108 (90 BASE) MCG/ACT IN AERS
2.0000 | INHALATION_SPRAY | Freq: Four times a day (QID) | RESPIRATORY_TRACT | Status: DC | PRN
  Administered 2021-12-14 – 2021-12-16 (×2): 2 via RESPIRATORY_TRACT
  Filled 2021-12-13: qty 6.7

## 2021-12-13 NOTE — Group Note (Signed)
[place] LCSW Group Therapy Note ? ? ?Group Date: 12/13/2021 ?Start Time: 1300 ?End Time: 1400 ? ?Type of Therapy/Topic:  Group Therapy:  Feelings about Diagnosis ? ?Participation Level:  Did Not Attend  ? ?Description of Group:   ? This group will allow patients to explore their thoughts and feelings about diagnoses they have received. Patients will be guided to explore their level of understanding and acceptance of these diagnoses. Facilitator will encourage patients to process their thoughts and feelings about the reactions of others to their diagnosis, and will guide patients in identifying ways to discuss their diagnosis with significant others in their lives. This group will be process-oriented, with patients participating in exploration of their own experiences as well as giving and receiving support and challenge from other group members. ? ? ?Therapeutic Goals: ?1. Patient will demonstrate understanding of diagnosis as evidence by identifying two or more symptoms of the disorder:  ?2. Patient will be able to express two feelings regarding the diagnosis ?3. Patient will demonstrate ability to communicate their needs through discussion and/or role plays ? ?Summary of Patient Progress: ?X ? ? ?Therapeutic Modalities:   ?Cognitive Behavioral Therapy ?Brief Therapy ?Feelings Identification  ? ? ?Shirl Harris, LCSW ?

## 2021-12-13 NOTE — [facility] Suicide Risk Assessment (Signed)
[facility] Admission Suicide Risk Assessment ? ? ?Nursing information obtained from:  Patient ?Demographic factors:  Male, Age 66 or older, Unemployed ?Current Mental Status:  NA ?Loss Factors:  Decline in physical health ?Historical Factors:  NA ?Risk Reduction Factors:  Positive social support ? ?Total Time spent with patient: 1 hour ?Principal Problem: Schizophrenia ([place]) ?Diagnosis:  Principal Problem: ?  Schizophrenia ([place]) ?Active Problems: ?  Hypertension associated with diabetes ([place]) ?  Old myocardial infarction ?  Type 2 diabetes mellitus with morbid obesity ([place]) ?  Hyperlipidemia associated with type 2 diabetes mellitus ([place]) ?  CAD (coronary artery disease) ? ?Subjective Data: Patient seen and chart reviewed.  66 year old man who was sent here from his group home with reports of behavioral problems.  Allegedly he tried to "commit suicide" by spraying himself in the face with deodorant.  Patient tells me that he thinks it should be obvious that that was not a suicide attempt regardless of what he was saying at the time.  Denies any suicidal ideation.  He says he was just angry at the group home ? ?Continued Clinical Symptoms:  ?Alcohol Use Disorder Identification Test Final Score (AUDIT): 0 ?The "Alcohol Use Disorders Identification Test", Guidelines for Use in Primary Care, Second Edition.  World Pharmacologist [facility]). ?Score between 0-7:  no or low risk or alcohol related problems. ?Score between 8-15:  moderate risk of alcohol related problems. ?Score between 16-19:  high risk of alcohol related problems. ?Score 20 or above:  warrants further diagnostic evaluation for alcohol dependence and treatment. ? ? ?CLINICAL FACTORS:  ? Schizophrenia:   Paranoid or undifferentiated type ? ? ?Musculoskeletal: ?Strength & Muscle Tone: within normal limits ?Gait & Station: normal ?Patient leans: N/A ? ?Psychiatric Specialty Exam: ? ?Presentation  ?General Appearance: Casual ? ?Eye Contact:Good ? ?Speech:Clear and  Coherent ? ?Speech Volume:Normal ? ?Handedness:Right ? ? ?Mood and Affect  ?Mood:Anxious ? ?Affect:Congruent ? ? ?Thought Process  ?Thought Processes:Coherent ? ?Descriptions of Associations:Intact ? ?Orientation:Full (Time, Place and Person) ? ?Thought Content:Logical ? ?History of Schizophrenia/Schizoaffective disorder:Yes ? ?Duration of Psychotic Symptoms:Greater than six months ? ?Hallucinations:Hallucinations: None ? ?Ideas of Reference:None ? ?Suicidal Thoughts:Suicidal Thoughts: No ? ?Homicidal Thoughts:Homicidal Thoughts: No ? ? ?Sensorium  ?Memory:Immediate Good; Recent Good; Remote Good ? ?Judgment:Fair ? ?Insight:Fair ? ? ?Executive Functions  ?Concentration:Fair ? ?Attention Span:Fair ? ?Recall:Fair ? ?[place] ? ?Language:Fair ? ? ?Psychomotor Activity  ?Psychomotor Activity:Psychomotor Activity: Decreased ? ? ?Assets  ?Assets:Communication Skills; Desire for Improvement; Financial Resources/Insurance; Housing; Physical Health; Social Support; Transportation ? ? ?Sleep  ?Sleep:Sleep: Fair ?Number of Hours of Sleep: 6 ? ? ? ?Physical Exam: ?Physical Exam ?Vitals and nursing note reviewed.  ?Constitutional:   ?   Appearance: Normal appearance.  ?HENT:  ?   Head: Normocephalic and atraumatic.  ?   Mouth/Throat:  ?   Pharynx: Oropharynx is clear.  ?Eyes:  ?   Pupils: Pupils are equal, round, and reactive to light.  ?Cardiovascular:  ?   Rate and Rhythm: Normal rate and regular rhythm.  ?Pulmonary:  ?   Effort: Pulmonary effort is normal.  ?   Breath sounds: Normal breath sounds.  ?Abdominal:  ?   General: Abdomen is flat.  ?   Palpations: Abdomen is soft.  ?Musculoskeletal:     ?   General: Normal range of motion.  ?Skin: ?   General: Skin is warm and dry.  ? ?    ?Neurological:  ?   General: No focal deficit present.  ?  Mental Status: He is alert. Mental status is at baseline.  ?Psychiatric:     ?   Attention and Perception: Attention normal.     ?   Mood and Affect: Mood normal.     ?    Speech: Speech is tangential.     ?   Behavior: Behavior normal.     ?   Thought Content: Thought content normal.     ?   Cognition and Memory: Cognition normal.     ?   Judgment: Judgment is impulsive.  ? ?Review of Systems  ?Constitutional: Negative.   ?HENT: Negative.    ?Eyes: Negative.   ?Respiratory: Negative.    ?Cardiovascular: Negative.   ?Gastrointestinal: Negative.   ?Musculoskeletal: Negative.   ?Skin: Negative.   ?Neurological: Negative.   ?Psychiatric/Behavioral:  Negative for depression, hallucinations, memory loss, substance abuse and suicidal ideas. The patient is not nervous/anxious and does not have insomnia.   ?Blood pressure 115/75, pulse 84, temperature 98.4 ?F (36.9 ?C), temperature source Oral, resp. rate 18, height '5\' 5"'$  (1.651 m), weight 99.8 kg, SpO2 98 %. Body mass index is 36.61 kg/m?. ? ? ?COGNITIVE FEATURES THAT CONTRIBUTE TO RISK:  ?None   ? ?SUICIDE RISK:  ? Minimal: No identifiable suicidal ideation.  Patients presenting with no risk factors but with morbid ruminations; may be classified as minimal risk based on the severity of the depressive symptoms ? ?PLAN OF CARE: Continue 15-minute checks.  Try to restart all of his usual medical medicines as best I can.  Follow up on the labs.  Engage patient in full treatment team assessment and individual and group counseling on the unit.  Work on placement planning ? ?I certify that inpatient services furnished can reasonably be expected to improve the patient's condition.  ? ?Alethia Berthold, MD ?12/13/2021, 4:08 PM ? ?

## 2021-12-13 NOTE — Tx Team (Signed)
Initial Treatment Plan ?12/13/2021 ?4:22 PM ?Evan [name] ?KMM:[id number] ? ? ? ?PATIENT STRESSORS: ?Health problems   ? ? ?PATIENT STRENGTHS: ?Motivation for treatment/growth  ? ? ?PATIENT IDENTIFIED PROBLEMS: ?Patient did not identify any problems  ?  ?  ?  ?  ?  ?  ?  ?  ?  ? ?DISCHARGE CRITERIA:  ?Improved stabilization in mood, thinking, and/or behavior ? ?PRELIMINARY DISCHARGE PLAN: ?Placement in alternative living arrangements ? ?PATIENT/FAMILY INVOLVEMENT: ?This treatment plan has been presented to and reviewed with the patient, Evan [name].  The patient and family have been given the opportunity to ask questions and make suggestions. ? ?Donette Larry, RN ?12/13/2021, 4:22 PM ?

## 2021-12-13 NOTE — Progress Notes (Signed)
Patient presents delusional stating to this Probation officer and staff that he is good friends with Ivin Booty, Andris Flurry among others. Pt pleasant and observed interacting appropriately with staff and peers on the unit. Pt didn't have any medications scheduled tonight. Pt given education, support, and encouragement to be active in his treatment plan. Pt being monitored Q 15 minutes for safety per unit protocol. Pt remains safe on the unit.  ?

## 2021-12-13 NOTE — ED Notes (Signed)
Pt understands the plan is for him to be admitted to the  ?

## 2021-12-13 NOTE — ED Notes (Signed)
Pt discharged to BMU under IVC.  VS stable.  Walker and belongings sent with patient.  ?

## 2021-12-13 NOTE — BH Assessment (Addendum)
Patient is to be admitted to [facility] BMU today 12/13/21 by Dr. Weber Cooks.  ?Attending Physician will be Dr.  Weber Cooks .   ?Patient has been assigned to room 302, by [place] Nurse, Havard Radigan.   ? ?ER staff is aware of the admission: ?[name], ER Secretary   ?Dr. Corky Downs, ER MD  ?Amy, Patient's Nurse  ?Seth Bake, Patient Access.  ?

## 2021-12-13 NOTE — ED Notes (Signed)
Patient has reddened area on lower rt side abdominal and rt side groining skin fold. EDP made aware. Area washed with soap and water and order for nystantin powder received.  ?

## 2021-12-13 NOTE — ED Notes (Signed)
Patient resting quietly in room. No noted distress or abnormal behaviors noted. Will continue 15 minute checks. ?

## 2021-12-13 NOTE — H&P (Signed)
Psychiatric Admission Assessment Adult  Patient Identification: Evan [name] MRN:  [id number] Date of Evaluation:  12/13/2021 Chief Complaint:  Schizophrenia (HCC) [F20.9] Principal Diagnosis: Schizophrenia (HCC) Diagnosis:  Principal Problem:   Schizophrenia (HCC) Active Problems:   Hypertension associated with diabetes (HCC)   Old myocardial infarction   Type 2 diabetes mellitus with morbid obesity (HCC)   Hyperlipidemia associated with type 2 diabetes mellitus (HCC)   CAD (coronary artery disease)  History of Present Illness: Patient seen and chart reviewed.  This is a 66 year old man who resides in a group home and was brought here because his family Etling he has been having behavior problems arguing and fighting with him.  I am told that one of the issues was that they were upset that he refused to take psychiatric medicine.  Apparently this time things escalated and the patient took a can of aerosol deodorant and sprayed himself in the face claiming that he was trying to kill himself.  On interview the patient has tangential rambling speech but is easily redirected.  Has lots of long stories about various ways that he has been mistreated which are hard to follow because the timelines all overlap.  He says about this particular 1 that the people at the group home have been mean and unpleasant to him but he completely denies that he had any suicidal intent he said he was just losing his temper at the time.  He also denies having been physically aggressive with anyone.  As far as symptoms his mood is good and he denies having any hallucinations.  In the course of his talking he does occasionally touch a light his believe that he is related to the famous actor and actress who share his last name, but other than that believe there does not seem to be any delusional content to it.  For all I know maybe he is related to them.  Anyway he is pleasant enough and agreeable to treatment and has no  specific physical complaint right now.  Not drinking or using drugs. Associated Signs/Symptoms: Depression Symptoms: Numerous none Duration of Depression Symptoms: No data recorded (Hypo) Manic Symptoms:  Impulsivity, Anxiety Symptoms:   Denies any Psychotic Symptoms:   Honestly I really could not elicit any in our conversation.  I cannot be sure that the somewhat odd things he mentions or delusional.  He does not seem to be responding to internal stimuli PTSD Symptoms: Negative Total Time spent with patient: 1 hour  Past Psychiatric History: Patient currently is given a diagnosis of schizophrenia but I have to say I am a little skeptical of it.  Looking back over his old chart there have been plenty of encounters where he is not listed as having any specific mental health issue.  The psychotic symptoms often seem to be based on just some of this odd conversation he has but I am not sure in any of it really represents true psychosis.  It could be that he is just sort of a simple fellow who has no one else to take care of him and sometimes has some behaviors.  I do not see any evidence of any suicide attempts in the past.  Psychiatric medicines when they have been used have been pretty low dose.  Right now he is listed as being prescribed low-dose Risperdal and maybe BuSpar.  Is the patient at risk to self? No.  Has the patient been a risk to self in the past 6 months? No.  Has the patient been a risk to self within the distant past? No.  Is the patient a risk to others? No.  Has the patient been a risk to others in the past 6 months? No.  Has the patient been a risk to others within the distant past? No.   Prior Inpatient Therapy:   Prior Outpatient Therapy:    Alcohol Screening: Patient refused Alcohol Screening Tool: Yes 1. How often do you have a drink containing alcohol?: Never 2. How many drinks containing alcohol do you have on a typical day when you are drinking?: 1 or 2 3. How often  do you have six or more drinks on one occasion?: Never AUDIT-C Score: 0 4. How often during the last year have you found that you were not able to stop drinking once you had started?: Never 5. How often during the last year have you failed to do what was normally expected from you because of drinking?: Never 6. How often during the last year have you needed a first drink in the morning to get yourself going after a heavy drinking session?: Never 7. How often during the last year have you had a feeling of guilt of remorse after drinking?: Never 8. How often during the last year have you been unable to remember what happened the night before because you had been drinking?: Never 9. Have you or someone else been injured as a result of your drinking?: No 10. Has a relative or friend or a doctor or another health worker been concerned about your drinking or suggested you cut down?: No Alcohol Use Disorder Identification Test Final Score (AUDIT): 0 Substance Abuse History in the last 12 months:  No. Consequences of Substance Abuse: Negative Previous Psychotropic Medications: Yes  Psychological Evaluations: Yes  Past Medical History:  Past Medical History:  Diagnosis Date   Anginal pain (HCC)    Anxiety disorder    Asthma    Atresia of esophagus without fistula    CAD (coronary artery disease)    Cellulitis    CHF (congestive heart failure) (HCC)    NYHA CLASS III,CHRONIC,DIASTOLIC   COPD (chronic obstructive pulmonary disease) (HCC)    COVID-19    Diabetes mellitus without complication (HCC)    Edema    RIGHT LOWER LEG   Gastroesophageal reflux    H/O: GI bleed    History of pneumonia    Remote   History of scarlet fever    Childhood   Hyperlipidemia    Hypertension    Myocardial infarction (HCC) 2009   Obesity    Obstructive sleep apnea    Pain    CHRONIC BACK / ABDOMINAL   Panic disorder    Peripheral venous insufficiency    PTSD (post-traumatic stress disorder)     Retinopathy    DIABETIC   Stasis, venous    Stroke [facility])    Vertigo     Past Surgical History:  Procedure Laterality Date   CARDIAC CATHETERIZATION     CATARACT EXTRACTION Left    CATARACT EXTRACTION W/PHACO Right 05/03/2017   Procedure: CATARACT EXTRACTION PHACO AND INTRAOCULAR LENS PLACEMENT (IOC);  Surgeon: Lockie Mola, MD;  Location: [facility] ORS;  Service: Ophthalmology;  Laterality: Right;  [place] 00:35.3 AP% 12.3 CDE 4.33 Fluid Pack lot # [id number]        COLONOSCOPY WITH PROPOFOL N/A 11/26/2020   Procedure: COLONOSCOPY WITH PROPOFOL;  Surgeon: Wyline Mood, MD;  Location[facility] ENDOSCOPY;  Service: Gastroenterology;  Laterality: N/A;  ANNETTE  TO PICK UP [phone]   CORONARY ANGIOPLASTY WITH STENT PLACEMENT  2002   CORONARY ANGIOPLASTY WITH STENT PLACEMENT  1999   CORONARY ARTERY BYPASS GRAFT     x7   ESOPHAGOGASTRODUODENOSCOPY N/A 09/19/2016   Procedure: ESOPHAGOGASTRODUODENOSCOPY (EGD);  Surgeon: Christena Deem, MD;  Location: [facility] ENDOSCOPY;  Service: Endoscopy;  Laterality: N/A;   ESOPHAGOGASTRODUODENOSCOPY (EGD) WITH PROPOFOL  11/26/2020   Procedure: ESOPHAGOGASTRODUODENOSCOPY (EGD) WITH PROPOFOL;  Surgeon: Wyline Mood, MD;  Location: [facility] ENDOSCOPY;  Service: Gastroenterology;;   Family History:  Family History  Problem Relation Age of Onset   Heart attack Mother    Hypertension Mother    Hyperlipidemia Mother    Heart attack Brother 54       MI   Coronary artery disease Other    Family Psychiatric  History: No information available Tobacco Screening:   Social History:  Social History   Substance and Sexual Activity  Alcohol Use No     Social History   Substance and Sexual Activity  Drug Use No    Additional Social History:                           Allergies:   Allergies  Allergen Reactions   Penicillin G Hives   Sulfa Antibiotics Hives   Tiotropium    Zoloft [Sertraline Hcl] Other (See Comments)   Lab Results:  Results for  orders placed or performed during the hospital encounter of 12/12/21 (from the past 48 hour(s))  Comprehensive metabolic panel     Status: Abnormal   Collection Time: 12/12/21  5:21 PM  Result Value Ref Range   Sodium 139 135 - 145 mmol/L   Potassium 3.9 3.5 - 5.1 mmol/L   Chloride 107 98 - 111 mmol/L   CO2 27 22 - 32 mmol/L   Glucose, Bld 114 (H) 70 - 99 mg/dL    Comment: Glucose reference range applies only to samples taken after fasting for at least 8 hours.   BUN 24 (H) 8 - 23 mg/dL   Creatinine, Ser 0.98 0.61 - 1.24 mg/dL   Calcium 9.2 8.9 - 11.9 mg/dL   Total Protein 6.7 6.5 - 8.1 g/dL   Albumin 3.6 3.5 - 5.0 g/dL   AST 19 15 - 41 U/L   ALT 18 0 - 44 U/L   Alkaline Phosphatase 61 38 - 126 U/L   Total Bilirubin 0.7 0.3 - 1.2 mg/dL   GFR, Estimated >14 >78 mL/min    Comment: (NOTE) Calculated using the CKD-EPI Creatinine Equation (2021)    Anion gap 5 5 - 15    Comment: Performed at [facility], [address] [place], [place] [address]  Ethanol     Status: None   Collection Time: 12/12/21  5:21 PM  Result Value Ref Range   Alcohol, Ethyl (B) <10 <10 mg/dL    Comment: (NOTE) Lowest detectable limit for serum alcohol is 10 mg/dL.  For medical purposes only. Performed at [facility], [address] [place], [place] [address]   Salicylate level     Status: Abnormal   Collection Time: 12/12/21  5:21 PM  Result Value Ref Range   Salicylate Lvl <7.0 (L) 7.0 - 30.0 mg/dL    Comment: Performed at [facility], [address], [place], [place] [address]  Acetaminophen level     Status: Abnormal   Collection Time: 12/12/21  5:21 PM  Result Value Ref Range  Acetaminophen (Tylenol), Serum <10 (L) 10 - 30 ug/mL    Comment: (NOTE) Therapeutic concentrations vary significantly. A range of 10-30 ug/mL  may be an effective concentration for many patients. However, some  are best treated at concentrations outside of this range. Acetaminophen  concentrations >150 ug/mL at 4 hours after ingestion  and >50 ug/mL at 12 hours after ingestion are often associated with  toxic reactions.  Performed at [facility], [address] [place], [place] [address]   cbc     Status: None   Collection Time: 12/12/21  5:21 PM  Result Value Ref Range   WBC 10.1 4.0 - 10.5 K/uL   RBC 4.56 4.22 - 5.81 MIL/uL   Hemoglobin 13.5 13.0 - 17.0 g/dL   HCT 78.2 95.6 - 21.3 %   MCV 93.0 80.0 - 100.0 fL   MCH 29.6 26.0 - 34.0 pg   MCHC 31.8 30.0 - 36.0 g/dL   RDW 08.6 57.8 - 46.9 %   Platelets 231 150 - 400 K/uL   nRBC 0.0 0.0 - 0.2 %    Comment: Performed at [facility], [address], [place], [place] [address]  Urine Drug Screen, Qualitative     Status: None   Collection Time: 12/12/21  5:21 PM  Result Value Ref Range   Tricyclic, Ur Screen NONE DETECTED NONE DETECTED   Amphetamines, Ur Screen NONE DETECTED NONE DETECTED   MDMA (Ecstasy)Ur Screen NONE DETECTED NONE DETECTED   Cocaine Metabolite,Ur [place] NONE DETECTED NONE DETECTED   Opiate, Ur Screen NONE DETECTED NONE DETECTED   Phencyclidine (PCP) Ur S NONE DETECTED NONE DETECTED   Cannabinoid 50 Ng, Ur [place] NONE DETECTED NONE DETECTED   Barbiturates, Ur Screen NONE DETECTED NONE DETECTED   Benzodiazepine, Ur Scrn NONE DETECTED NONE DETECTED   Methadone Scn, Ur NONE DETECTED NONE DETECTED    Comment: (NOTE) Tricyclics + metabolites, urine    Cutoff 1000 ng/mL Amphetamines + metabolites, urine  Cutoff 1000 ng/mL MDMA (Ecstasy), urine              Cutoff 500 ng/mL Cocaine Metabolite, urine          Cutoff 300 ng/mL Opiate + metabolites, urine        Cutoff 300 ng/mL Phencyclidine (PCP), urine         Cutoff 25 ng/mL Cannabinoid, urine                 Cutoff 50 ng/mL Barbiturates + metabolites, urine  Cutoff 200 ng/mL Benzodiazepine, urine              Cutoff 200 ng/mL Methadone, urine                   Cutoff 300 ng/mL  The urine drug screen provides only a preliminary,  unconfirmed analytical test result and should not be used for non-medical purposes. Clinical consideration and professional judgment should be applied to any positive drug screen result due to possible interfering substances. A more specific alternate chemical method must be used in order to obtain a confirmed analytical result. Gas chromatography / mass spectrometry (GC/MS) is the preferred confirm atory method. Performed at [facility], [address] [place], [place] [address]   Resp Panel by RT-PCR (Flu A&B, Covid) Nasopharyngeal Swab     Status: None   Collection Time: 12/12/21  5:21 PM   Specimen: Nasopharyngeal Swab; Nasopharyngeal(NP) swabs in vial transport medium  Result Value Ref Range   SARS Coronavirus  2 by RT PCR NEGATIVE NEGATIVE    Comment: (NOTE) SARS-CoV-2 target nucleic acids are NOT DETECTED.  The SARS-CoV-2 RNA is generally detectable in upper respiratory specimens during the acute phase of infection. The lowest concentration of SARS-CoV-2 viral copies this assay can detect is 138 copies/mL. A negative result does not preclude SARS-Cov-2 infection and should not be used as the sole basis for treatment or other patient management decisions. A negative result may occur with  improper specimen collection/handling, submission of specimen other than nasopharyngeal swab, presence of viral mutation(s) within the areas targeted by this assay, and inadequate number of viral copies(<138 copies/mL). A negative result must be combined with clinical observations, patient history, and epidemiological information. The expected result is Negative.  Fact Sheet for Patients:  BloggerCourse.com  Fact Sheet for Healthcare Providers:  SeriousBroker.it  This test is no t yet approved or cleared by the [place] FDA and  has been authorized for detection and/or diagnosis of SARS-CoV-2 by FDA under an Emergency Use  Authorization (EUA). This EUA will remain  in effect (meaning this test can be used) for the duration of the COVID-19 declaration under Section 564(b)(1) of the Act, 21 U.S.C.section 360bbb-3(b)(1), unless the authorization is terminated  or revoked sooner.       Influenza A by PCR NEGATIVE NEGATIVE   Influenza B by PCR NEGATIVE NEGATIVE    Comment: (NOTE) The Xpert Xpress SARS-CoV-2/FLU/RSV plus assay is intended as an aid in the diagnosis of influenza from Nasopharyngeal swab specimens and should not be used as a sole basis for treatment. Nasal washings and aspirates are unacceptable for Xpert Xpress SARS-CoV-2/FLU/RSV testing.  Fact Sheet for Patients: BloggerCourse.com  Fact Sheet for Healthcare Providers: SeriousBroker.it  This test is not yet approved or cleared by the [place] FDA and has been authorized for detection and/or diagnosis of SARS-CoV-2 by FDA under an Emergency Use Authorization (EUA). This EUA will remain in effect (meaning this test can be used) for the duration of the COVID-19 declaration under Section 564(b)(1) of the Act, 21 U.S.C. section 360bbb-3(b)(1), unless the authorization is terminated or revoked.  Performed at [facility], [address] [place], [place] [address]   Urinalysis, Complete w Microscopic     Status: Abnormal   Collection Time: 12/12/21  5:21 PM  Result Value Ref Range   Color, Urine YELLOW (A) YELLOW   APPearance CLEAR (A) CLEAR   Specific Gravity, Urine 1.013 1.005 - 1.030   pH 6.0 5.0 - 8.0   Glucose, UA >=500 (A) NEGATIVE mg/dL   Hgb urine dipstick NEGATIVE NEGATIVE   Bilirubin Urine NEGATIVE NEGATIVE   Ketones, ur NEGATIVE NEGATIVE mg/dL   Protein, ur NEGATIVE NEGATIVE mg/dL   Nitrite NEGATIVE NEGATIVE   Leukocytes,Ua NEGATIVE NEGATIVE   WBC, UA 0-5 0 - 5 WBC/hpf   Bacteria, UA NONE SEEN NONE SEEN   Squamous Epithelial / LPF NONE SEEN 0 - 5   Mucus  PRESENT     Comment: Performed at [facility], [address] [place], [place] [address]    Blood Alcohol level:  Lab Results  Component Value Date   [facility] <10 12/12/2021   ETH <10 09/01/2021    Metabolic Disorder Labs:  Lab Results  Component Value Date   HGBA1C 5.7 (H) 11/10/2021   MPG 148.46 11/06/2020   MPG 129 03/20/2008   No results found for: PROLACTIN Lab Results  Component Value Date   CHOL 263 (H) 11/10/2021   TRIG 194 (  H) 11/10/2021   HDL 55 11/10/2021   CHOLHDL 4.5 11/07/2020   VLDL 23 11/07/2020   LDLCALC 172 (H) 11/10/2021   LDLCALC 118 (H) 08/12/2021    Current Medications: Current Facility-Administered Medications  Medication Dose Route Frequency Provider Last Rate Last Admin   acetaminophen (TYLENOL) tablet 650 mg  650 mg Oral Q6H PRN Morgan Rennert T, MD       albuterol (VENTOLIN HFA) 108 (90 Base) MCG/ACT inhaler 2 puff  2 puff Inhalation Q6H PRN Nieko Clarin, Jackquline Denmark, MD       alum & mag hydroxide-simeth (MAALOX/MYLANTA) 200-200-20 MG/5ML suspension 30 mL  30 mL Oral Q4H PRN Sundus Pete, Jackquline Denmark, MD       aspirin EC tablet 81 mg  81 mg Oral Daily Kyna Blahnik, Jackquline Denmark, MD       ezetimibe (ZETIA) tablet 10 mg  10 mg Oral Daily Nicolai Labonte, Jackquline Denmark, MD       furosemide (LASIX) tablet 40 mg  40 mg Oral QPM Anice Wilshire T, MD       furosemide (LASIX) tablet 80 mg  80 mg Oral Daily Leatrice Parilla T, MD       insulin aspart (novoLOG) injection 0-9 Units  0-9 Units Subcutaneous TID WC Moncerrat Burnstein T, MD       irbesartan (AVAPRO) tablet 37.5 mg  37.5 mg Oral Daily Jericka Kadar,  T, MD       magnesium hydroxide (MILK OF MAGNESIA) suspension 30 mL  30 mL Oral Daily PRN Flora Parks, Jackquline Denmark, MD       metoprolol succinate (TOPROL-XL) 24 hr tablet 50 mg  50 mg Oral Daily Maurilio Puryear T, MD       nitroGLYCERIN (NITROSTAT) SL tablet 0.4 mg  0.4 mg Sublingual Q5 min PRN ella Crumm, Jackquline Denmark, MD       pantoprazole (PROTONIX) EC tablet 40 mg  40 mg Oral Daily Shawneequa Baldridge T, MD        psyllium (HYDROCIL/METAMUCIL) 1 packet  1 packet Oral Daily Jakarie Pember, Jackquline Denmark, MD       rivaroxaban (XARELTO) tablet 20 mg  20 mg Oral Q supper Armonee Bojanowski T, MD       rosuvastatin (CRESTOR) tablet 5 mg  5 mg Oral Daily Delinda Malan, Jackquline Denmark, MD       vitamin B-12 (CYANOCOBALAMIN) tablet 1,000 mcg  1,000 mcg Oral Daily , Jackquline Denmark, MD       PTA Medications: Medications Prior to Admission  Medication Sig Dispense Refill Last Dose   albuterol (VENTOLIN HFA) 108 (90 Base) MCG/ACT inhaler Inhale 2 puffs into the lungs every 6 (six) hours as needed for wheezing or shortness of breath. 18 g 4    aspirin 81 MG chewable tablet Chew 1 tablet (81 mg total) by mouth daily. 90 tablet 4    Blood Glucose Monitoring Suppl (ONETOUCH VERIO) w/Device KIT Use to check blood sugar 3 to 4 times a day and document.  Please bring to visits for review. 1 kit 0    busPIRone (BUSPAR) 5 MG tablet Take 1 tablet (5 mg total) by mouth 2 (two) times daily. 180 tablet 4    empagliflozin (JARDIANCE) 10 MG TABS tablet Take 1 tablet (10 mg total) by mouth daily. 90 tablet 4    ezetimibe (ZETIA) 10 MG tablet Take 1 tablet (10 mg total) by mouth daily. 90 tablet 4    furosemide (LASIX) 40 MG tablet TAKE 2 TABLETS BY MOUTH ONCE EVERY MORNING AND 1 TABLET ONCE EVERY  EVENING 270 tablet 4    glucose blood (ONETOUCH VERIO) test strip Use to check blood sugar 3 to 4 times a day. 100 each 12    GNP VITAMIN D SUPER STRENGTH 125 MCG (5000 UT) TABS TAKE 1 TABLET BY MOUTH ONCE DAILY 90 tablet 4    metoprolol succinate (TOPROL XL) 50 MG 24 hr tablet Take 1 tablet (50 mg total) by mouth daily. 90 tablet 4    nitroGLYCERIN (NITROSTAT) 0.4 MG SL tablet Place 1 tablet (0.4 mg total) under the tongue every 5 (five) minutes as needed for chest pain. 120 tablet 1    olmesartan (BENICAR) 5 MG tablet Take 1 tablet (5 mg total) by mouth daily. 90 tablet 4    ondansetron (ZOFRAN-ODT) 4 MG disintegrating tablet Take 1 tablet (4 mg total) by mouth every 8  (eight) hours as needed for nausea or vomiting. 12 tablet 0    OneTouch Delica Lancets 33G MISC Use to check blood sugar 2-3 times a day. 100 each 5    pantoprazole (PROTONIX) 40 MG tablet Take 1 tablet (40 mg total) by mouth daily. 90 tablet 4    psyllium (METAMUCIL) 58.6 % packet Take 1 packet by mouth daily. 30 each 12    risperiDONE (RISPERDAL) 0.5 MG tablet Take 1 tablet (0.5 mg total) by mouth 2 (two) times daily. 180 tablet 4    rosuvastatin (CRESTOR) 20 MG tablet Take 1 tablet (20 mg total) by mouth daily. 90 tablet 3    Saccharomyces boulardii (PROBIOTIC) 250 MG CAPS TAKE 1 CAPSULE BY MOUTH EVERY MORNING AND AT BEDTIME 60 capsule 6    sharps container 1 each by Does not apply route as needed. 1 each 1    Skin Protectants, Misc. (MINERIN CREME) CREA APPLY TOPICALLY TO AFFECTED AREA(S) AS NEEDED FOR DRY SKIN 454 g 1    SYMBICORT 80-4.5 MCG/ACT inhaler INHALE 2 PUFFS BY MOUTH 2 TIMES DAILY. RINSE MOUTH AFTER USE. 10.2 g 1    triamcinolone cream (KENALOG) 0.1 % Apply 1 application. topically 2 (two) times daily. 30 g 0    vitamin B-12 (CYANOCOBALAMIN) 1000 MCG tablet Take 1 tablet (1,000 mcg total) by mouth daily. 90 tablet 4    XARELTO 20 MG TABS tablet TAKE 1 TABLET BY MOUTH AT BEDTIME 90 tablet 4     Musculoskeletal: Strength & Muscle Tone: within normal limits Gait & Station: normal Patient leans: N/A            Psychiatric Specialty Exam:  Presentation  General Appearance: Casual  Eye Contact:Good  Speech:Clear and Coherent  Speech Volume:Normal  Handedness:Right   Mood and Affect  Mood:Anxious  Affect:Congruent   Thought Process  Thought Processes:Coherent  Duration of Psychotic Symptoms: Greater than six months  Past Diagnosis of Schizophrenia or Psychoactive disorder: Yes  Descriptions of Associations:Intact  Orientation:Full (Time, Place and Person)  Thought Content:Logical  Hallucinations:Hallucinations: None  Ideas of  Reference:None  Suicidal Thoughts:Suicidal Thoughts: No  Homicidal Thoughts:Homicidal Thoughts: No   Sensorium  Memory:Immediate Good; Recent Good; Remote Good  Judgment:Fair  Insight:Fair   Executive Functions  Concentration:Fair  Attention Span:Fair  Recall:Fair  Fund of Knowledge:Fair  Language:Fair   Psychomotor Activity  Psychomotor Activity:Psychomotor Activity: Decreased   Assets  Assets:Communication Skills; Desire for Improvement; Financial Resources/Insurance; Housing; Physical Health; Social Support; Transportation   Sleep  Sleep:Sleep: Fair Number of Hours of Sleep: 6    Physical Exam: Physical Exam Vitals and nursing note reviewed.  Constitutional:  Appearance: Normal appearance.  HENT:     Head: Normocephalic and atraumatic.     Mouth/Throat:     Pharynx: Oropharynx is clear.  Eyes:     Pupils: Pupils are equal, round, and reactive to light.  Cardiovascular:     Rate and Rhythm: Normal rate and regular rhythm.  Pulmonary:     Effort: Pulmonary effort is normal.     Breath sounds: Normal breath sounds.  Abdominal:     General: Abdomen is flat.     Palpations: Abdomen is soft.  Musculoskeletal:        General: Normal range of motion.       Feet:  Skin:    General: Skin is warm and dry.  Neurological:     General: No focal deficit present.     Mental Status: He is alert. Mental status is at baseline.  Psychiatric:        Attention and Perception: He is inattentive.        Mood and Affect: Mood normal.        Speech: Speech is tangential.        Behavior: Behavior is cooperative.        Thought Content: Thought content normal.        Cognition and Memory: Cognition normal.        Judgment: Judgment is impulsive.   Review of Systems  Constitutional: Negative.   HENT: Negative.    Eyes: Negative.   Respiratory: Negative.    Cardiovascular: Negative.   Gastrointestinal: Negative.   Musculoskeletal: Negative.   Skin:  Negative.   Neurological: Negative.   Psychiatric/Behavioral: Negative.    Blood pressure 115/75, pulse 84, temperature 98.4 F (36.9 C), temperature source Oral, resp. rate 18, height 5\' 5"  (1.651 m), weight 99.8 kg, SpO2 98 %. Body mass index is 36.61 kg/m.  Treatment Plan Summary: Medication management and Plan I have tried to put him back on his regular medications as best I can.  He has congestive heart failure history of coronary artery disease COPD diabetes hyperlipidemia and possibly chronic atrial fibrillation along with the chronic venous stasis in his legs.  His blood sugars seem to be pretty well controlled and it looks like all he was taking was oral Jardiance.  Unfortunately we do not carry that here so I am having to replace it with sliding scale insulin.  I will order the TED hose.  At this time I am going to hold off on any psychiatric medicine as I really do not see any definite need for anything specific to treat.  We will see how he does over a day or 2 and whether anything might be needed.  Hopefully we will be able to send him back to his group home because I am told they only put in a 30-day notice not an instant discharge  Observation Level/Precautions:  15 minute checks  Laboratory:  UDS  Psychotherapy:    Medications:    Consultations:    Discharge Concerns:    Estimated LOS:  Other:     Physician Treatment Plan for Primary Diagnosis: Schizophrenia (HCC) Long Term Goal(s): Improvement in symptoms so as ready for discharge  Short Term Goals: Ability to verbalize feelings will improve and Ability to demonstrate self-control will improve  Physician Treatment Plan for Secondary Diagnosis: Principal Problem:   Schizophrenia (HCC) Active Problems:   Hypertension associated with diabetes (HCC)   Old myocardial infarction   Type 2 diabetes mellitus with morbid obesity (HCC)  Hyperlipidemia associated with type 2 diabetes mellitus (HCC)   CAD (coronary artery  disease)  Long Term Goal(s): Improvement in symptoms so as ready for discharge  Short Term Goals: Ability to identify and develop effective coping behaviors will improve and Compliance with prescribed medications will improve  I certify that inpatient services furnished can reasonably be expected to improve the patient's condition.    Mordecai Rasmussen, MD 5/9/20234:15 PM

## 2021-12-13 NOTE — Progress Notes (Signed)
Patient ID: Evan [name], male   DOB: 10-15-1955, 66 y.o.   MRN: [id number] ? ?Admission note: ?Patient is a 66 year old male, presents IVC per report of schizophrenia. Patient is alert and oriented to unit. Patients affect is appropriate to situation. Patient is cooperative and pleasant during assessment questions. Patient presents with bilateral rash on chest, umbilical hernia, and a scar from CABG.  ?Patient minimizes reason for admission to the hospital repeatedly stating he was provoked. Patient denies stressors related to admission at this time. Patient presents calm and cooperative.  ?Patient currently denies SI/HI/AVH. ?Patient endorses grandeur delusions of being friends with different celebrities online. Patient states Ardyth Man from [employer] "got saved and has been messaging me." ?Patient states goals from this  ?Unit policies explained and verbalized understanding. Q15 minute checks maintained and will continue to monitor.   ?

## 2021-12-13 NOTE — ED Notes (Signed)
Pt ambulated to bathroom with walker.  Walker returned to nurse's station.   ?

## 2021-12-14 ENCOUNTER — Telehealth: Payer: Medicare Other

## 2021-12-14 DIAGNOSIS — F203 Undifferentiated schizophrenia: Secondary | ICD-10-CM | POA: Diagnosis not present

## 2021-12-14 LAB — HEMOGLOBIN A1C
Hgb A1c MFr Bld: 6.3 % — ABNORMAL HIGH (ref 4.8–5.6)
Mean Plasma Glucose: 134.11 mg/dL

## 2021-12-14 LAB — GLUCOSE, CAPILLARY
Glucose-Capillary: 132 mg/dL — ABNORMAL HIGH (ref 70–99)
Glucose-Capillary: 132 mg/dL — ABNORMAL HIGH (ref 70–99)
Glucose-Capillary: 123 mg/dL — ABNORMAL HIGH (ref 70–99)

## 2021-12-14 NOTE — Progress Notes (Signed)
Pt came to the nurses station complaining of a rash. Pt assessed and rash noted under Right breast. Pt cleaned the area and then put barrier cream on it.  ?

## 2021-12-14 NOTE — Progress Notes (Signed)
[facility] MD Progress Note ? ?12/14/2021 5:37 PM ?Evan [name]  ?MRN:  [id number] ?Subjective: Patient seen for follow-up.  No new complaints.  Behavior has been fine all day.  No aggression and no agitation.  Attended treatment team very appropriately.  Not reporting any hallucinations or any suicidal ideation.  Compliant.  No medical problems ?Principal Problem: Schizophrenia ([place]) ?Diagnosis: Principal Problem: ?  Schizophrenia ([place]) ?Active Problems: ?  Hypertension associated with diabetes ([place]) ?  Old myocardial infarction ?  Type 2 diabetes mellitus with morbid obesity ([place]) ?  Hyperlipidemia associated with type 2 diabetes mellitus ([place]) ?  CAD (coronary artery disease) ? ?Total Time spent with patient: 30 minutes ? ?Past Psychiatric History: Past history of supposed schizophrenia versus just perhaps some chronic peculiarities. ? ?Past Medical History:  ?Past Medical History:  ?Diagnosis Date  ? Anginal pain ([place])   ? Anxiety disorder   ? Asthma   ? Atresia of esophagus without fistula   ? CAD (coronary artery disease)   ? Cellulitis   ? CHF (congestive heart failure) ([place])   ? NYHA CLASS III,CHRONIC,DIASTOLIC  ? COPD (chronic obstructive pulmonary disease) ([place])   ? COVID-19   ? Diabetes mellitus without complication ([place])   ? Edema   ? RIGHT LOWER LEG  ? Gastroesophageal reflux   ? H/O: GI bleed   ? History of pneumonia   ? Remote  ? History of scarlet fever   ? Childhood  ? Hyperlipidemia   ? Hypertension   ? Myocardial infarction [facility]) 2009  ? Obesity   ? Obstructive sleep apnea   ? Pain   ? CHRONIC BACK / ABDOMINAL  ? Panic disorder   ? Peripheral venous insufficiency   ? PTSD (post-traumatic stress disorder)   ? Retinopathy   ? DIABETIC  ? Stasis, venous   ? Stroke [facility])   ? Vertigo   ?  ?Past Surgical History:  ?Procedure Laterality Date  ? CARDIAC CATHETERIZATION    ? CATARACT EXTRACTION Left   ? CATARACT EXTRACTION W/PHACO Right 05/03/2017  ? Procedure: CATARACT EXTRACTION PHACO AND INTRAOCULAR LENS  PLACEMENT (IOC);  Surgeon: Leandrew Koyanagi, MD;  Location: [facility] ORS;  Service: Ophthalmology;  Laterality: Right;  [place] 00:35.3 ?AP% 12.3 ?CDE 4.33 ?Fluid Pack lot # [id number] ? ? ? ? ?  ? COLONOSCOPY WITH PROPOFOL N/A 11/26/2020  ? Procedure: COLONOSCOPY WITH PROPOFOL;  Surgeon: Jonathon Bellows, MD;  Location: [facility] ENDOSCOPY;  Service: Gastroenterology;  Laterality: N/A;  ANNETTE TO PICK UP [phone]  ? CORONARY ANGIOPLASTY WITH STENT PLACEMENT  2002  ? CORONARY ANGIOPLASTY WITH STENT PLACEMENT  1999  ? CORONARY ARTERY BYPASS GRAFT    ? x7  ? ESOPHAGOGASTRODUODENOSCOPY N/A 09/19/2016  ? Procedure: ESOPHAGOGASTRODUODENOSCOPY (EGD);  Surgeon: Lollie Sails, MD;  Location: [facility] ENDOSCOPY;  Service: Endoscopy;  Laterality: N/A;  ? ESOPHAGOGASTRODUODENOSCOPY (EGD) WITH PROPOFOL  11/26/2020  ? Procedure: ESOPHAGOGASTRODUODENOSCOPY (EGD) WITH PROPOFOL;  Surgeon: Jonathon Bellows, MD;  Location: [facility] ENDOSCOPY;  Service: Gastroenterology;;  ? ?Family History:  ?Family History  ?Problem Relation Age of Onset  ? Heart attack Mother   ? Hypertension Mother   ? Hyperlipidemia Mother   ? Heart attack Brother 18  ?     MI  ? Coronary artery disease Other   ? ?Family Psychiatric  History: See previous ?Social History:  ?Social History  ? ?Substance and Sexual Activity  ?Alcohol Use No  ?   ?Social History  ? ?Substance and Sexual Activity  ?Drug Use No  ?  ?  Social History  ? ?Socioeconomic History  ? Marital status: Single  ?  Spouse name: Not on file  ? Number of children: Not on file  ? Years of education: Not on file  ? Highest education level: Not on file  ?Occupational History  ?  Comment: disabled  ?Tobacco Use  ? Smoking status: Never  ? Smokeless tobacco: Never  ?Vaping Use  ? Vaping Use: Never used  ?Substance and Sexual Activity  ? Alcohol use: No  ? Drug use: No  ? Sexual activity: Not Currently  ?Other Topics Concern  ? Not on file  ?Social History Narrative  ? Disabled  ? Single  ? ?Social Determinants of Health   ? ?Financial Resource Strain: Low Risk   ? Difficulty of Paying Living Expenses: Not hard at all  ?Food Insecurity: No Food Insecurity  ? Worried About Charity fundraiser in the Last Year: Never true  ? Ran Out of Food in the Last Year: Never true  ?Transportation Needs: No Transportation Needs  ? Lack of Transportation (Medical): No  ? Lack of Transportation (Non-Medical): No  ?Physical Activity: Sufficiently Active  ? Days of Exercise per Week: 7 days  ? Minutes of Exercise per Session: 30 min  ?Stress: No Stress Concern Present  ? Feeling of Stress : Not at all  ?Social Connections: Moderately Integrated  ? Frequency of Communication with Friends and Family: More than three times a week  ? Frequency of Social Gatherings with Friends and Family: More than three times a week  ? Attends Religious Services: More than 4 times per year  ? Active Member of Clubs or Organizations: Yes  ? Attends Archivist Meetings: More than 4 times per year  ? Marital Status: Never married  ? ?Additional Social History:  ?  ?  ?  ?  ?  ?  ?  ?  ?  ?  ?  ? ?Sleep: Fair ? ?Appetite:  Fair ? ?Current Medications: ?Current Facility-Administered Medications  ?Medication Dose Route Frequency Provider Last Rate Last Admin  ? acetaminophen (TYLENOL) tablet 650 mg  650 mg Oral Q6H PRN Damyiah Moxley T, MD      ? albuterol (VENTOLIN HFA) 108 (90 Base) MCG/ACT inhaler 2 puff  2 puff Inhalation Q6H PRN Belkys Henault, Madie Reno, MD   2 puff at 12/14/21 0255  ? alum & mag hydroxide-simeth (MAALOX/MYLANTA) 200-200-20 MG/5ML suspension 30 mL  30 mL Oral Q4H PRN Kiano Terrien T, MD      ? aspirin EC tablet 81 mg  81 mg Oral Daily Raudel Bazen, Madie Reno, MD   81 mg at 12/14/21 0747  ? ezetimibe (ZETIA) tablet 10 mg  10 mg Oral Daily Freddie Nghiem, Madie Reno, MD   10 mg at 12/14/21 0747  ? furosemide (LASIX) tablet 40 mg  40 mg Oral QPM Kaimana Neuzil, Madie Reno, MD   40 mg at 12/13/21 1828  ? furosemide (LASIX) tablet 80 mg  80 mg Oral Daily Ernestine Langworthy, Madie Reno, MD   80 mg at  12/14/21 0747  ? insulin aspart (novoLOG) injection 0-9 Units  0-9 Units Subcutaneous TID WC Leah Thornberry, Madie Reno, MD   1 Units at 12/14/21 1621  ? irbesartan (AVAPRO) tablet 37.5 mg  37.5 mg Oral Daily Aaron Bostwick T, MD   37.5 mg at 12/14/21 0747  ? magnesium hydroxide (MILK OF MAGNESIA) suspension 30 mL  30 mL Oral Daily PRN Mikaya Bunner, Madie Reno, MD      ? metoprolol succinate (  TOPROL-XL) 24 hr tablet 50 mg  50 mg Oral Daily Zyniah Ferraiolo T, MD   50 mg at 12/14/21 0747  ? nitroGLYCERIN (NITROSTAT) SL tablet 0.4 mg  0.4 mg Sublingual Q5 min PRN Jadwiga Faidley T, MD      ? pantoprazole (PROTONIX) EC tablet 40 mg  40 mg Oral Daily Bryley Chrisman, Madie Reno, MD   40 mg at 12/14/21 0746  ? psyllium (HYDROCIL/METAMUCIL) 1 packet  1 packet Oral Daily Laxmi Choung, Madie Reno, MD   1 packet at 12/14/21 [phone]  ? rivaroxaban (XARELTO) tablet 20 mg  20 mg Oral Q supper Adeyemi Hamad, Madie Reno, MD   20 mg at 12/14/21 1621  ? rosuvastatin (CRESTOR) tablet 5 mg  5 mg Oral Daily Lieutenant Abarca, Madie Reno, MD   5 mg at 12/14/21 0747  ? vitamin B-12 (CYANOCOBALAMIN) tablet 1,000 mcg  1,000 mcg Oral Daily Breiana Stratmann, Madie Reno, MD   1,000 mcg at 12/14/21 0747  ? ? ?Lab Results:  ?Results for orders placed or performed during the hospital encounter of 12/13/21 (from the past 48 hour(s))  ?Glucose, capillary     Status: Abnormal  ? Collection Time: 12/13/21  5:38 PM  ?Result Value Ref Range  ? Glucose-Capillary 152 (H) 70 - 99 mg/dL  ?  Comment: Glucose reference range applies only to samples taken after fasting for at least 8 hours.  ?Glucose, capillary     Status: Abnormal  ? Collection Time: 12/13/21  9:34 PM  ?Result Value Ref Range  ? Glucose-Capillary 193 (H) 70 - 99 mg/dL  ?  Comment: Glucose reference range applies only to samples taken after fasting for at least 8 hours.  ?Glucose, capillary     Status: Abnormal  ? Collection Time: 12/14/21  6:46 AM  ?Result Value Ref Range  ? Glucose-Capillary 132 (H) 70 - 99 mg/dL  ?  Comment: Glucose reference range applies only to samples  taken after fasting for at least 8 hours.  ?Glucose, capillary     Status: Abnormal  ? Collection Time: 12/14/21 11:54 AM  ?Result Value Ref Range  ? Glucose-Capillary 132 (H) 70 - 99 mg/dL  ?  Comment: Glucose refe

## 2021-12-14 NOTE — BH IP Treatment Plan (Signed)
Interdisciplinary Treatment and Diagnostic Plan Update ? ?12/14/2021 ?Time of Session: 9:30AM ?Evan [name] ?MRN: [id number] ? ?Principal Diagnosis: Schizophrenia ([place]) ? ?Secondary Diagnoses: Principal Problem: ?  Schizophrenia ([place]) ?Active Problems: ?  Hypertension associated with diabetes ([place]) ?  Old myocardial infarction ?  Type 2 diabetes mellitus with morbid obesity ([place]) ?  Hyperlipidemia associated with type 2 diabetes mellitus ([place]) ?  CAD (coronary artery disease) ? ? ?Current Medications:  ?Current Facility-Administered Medications  ?Medication Dose Route Frequency Provider Last Rate Last Admin  ? acetaminophen (TYLENOL) tablet 650 mg  650 mg Oral Q6H PRN Clapacs, John T, MD      ? albuterol (VENTOLIN HFA) 108 (90 Base) MCG/ACT inhaler 2 puff  2 puff Inhalation Q6H PRN Clapacs, Madie Reno, MD   2 puff at 12/14/21 0255  ? alum & mag hydroxide-simeth (MAALOX/MYLANTA) 200-200-20 MG/5ML suspension 30 mL  30 mL Oral Q4H PRN Clapacs, John T, MD      ? aspirin EC tablet 81 mg  81 mg Oral Daily Clapacs, Madie Reno, MD   81 mg at 12/14/21 0747  ? ezetimibe (ZETIA) tablet 10 mg  10 mg Oral Daily Clapacs, Madie Reno, MD   10 mg at 12/14/21 0747  ? furosemide (LASIX) tablet 40 mg  40 mg Oral QPM Clapacs, Madie Reno, MD   40 mg at 12/13/21 1828  ? furosemide (LASIX) tablet 80 mg  80 mg Oral Daily Clapacs, Madie Reno, MD   80 mg at 12/14/21 0747  ? insulin aspart (novoLOG) injection 0-9 Units  0-9 Units Subcutaneous TID WC Clapacs, Madie Reno, MD   1 Units at 12/14/21 1200  ? irbesartan (AVAPRO) tablet 37.5 mg  37.5 mg Oral Daily Clapacs, John T, MD   37.5 mg at 12/14/21 0747  ? magnesium hydroxide (MILK OF MAGNESIA) suspension 30 mL  30 mL Oral Daily PRN Clapacs, Madie Reno, MD      ? metoprolol succinate (TOPROL-XL) 24 hr tablet 50 mg  50 mg Oral Daily Clapacs, John T, MD   50 mg at 12/14/21 0747  ? nitroGLYCERIN (NITROSTAT) SL tablet 0.4 mg  0.4 mg Sublingual Q5 min PRN Clapacs, John T, MD      ? pantoprazole (PROTONIX) EC tablet 40 mg  40  mg Oral Daily Clapacs, Madie Reno, MD   40 mg at 12/14/21 0746  ? psyllium (HYDROCIL/METAMUCIL) 1 packet  1 packet Oral Daily Clapacs, Madie Reno, MD   1 packet at 12/14/21 [phone]  ? rivaroxaban (XARELTO) tablet 20 mg  20 mg Oral Q supper Clapacs, Madie Reno, MD   20 mg at 12/13/21 1828  ? rosuvastatin (CRESTOR) tablet 5 mg  5 mg Oral Daily Clapacs, Madie Reno, MD   5 mg at 12/14/21 0747  ? vitamin B-12 (CYANOCOBALAMIN) tablet 1,000 mcg  1,000 mcg Oral Daily Clapacs, Madie Reno, MD   1,000 mcg at 12/14/21 0747  ? ?PTA Medications: ?Medications Prior to Admission  ?Medication Sig Dispense Refill Last Dose  ? albuterol (VENTOLIN HFA) 108 (90 Base) MCG/ACT inhaler Inhale 2 puffs into the lungs every 6 (six) hours as needed for wheezing or shortness of breath. 18 g 4   ? aspirin 81 MG chewable tablet Chew 1 tablet (81 mg total) by mouth daily. 90 tablet 4   ? Blood Glucose Monitoring Suppl (ONETOUCH VERIO) w/Device KIT Use to check blood sugar 3 to 4 times a day and document.  Please bring to visits for review. 1 kit 0   ? busPIRone (  BUSPAR) 5 MG tablet Take 1 tablet (5 mg total) by mouth 2 (two) times daily. 180 tablet 4   ? empagliflozin (JARDIANCE) 10 MG TABS tablet Take 1 tablet (10 mg total) by mouth daily. 90 tablet 4   ? ezetimibe (ZETIA) 10 MG tablet Take 1 tablet (10 mg total) by mouth daily. 90 tablet 4   ? furosemide (LASIX) 40 MG tablet TAKE 2 TABLETS BY MOUTH ONCE EVERY MORNING AND 1 TABLET ONCE EVERY EVENING 270 tablet 4   ? glucose blood (ONETOUCH VERIO) test strip Use to check blood sugar 3 to 4 times a day. 100 each 12   ? GNP VITAMIN D SUPER STRENGTH 125 MCG (5000 UT) TABS TAKE 1 TABLET BY MOUTH ONCE DAILY 90 tablet 4   ? metoprolol succinate (TOPROL XL) 50 MG 24 hr tablet Take 1 tablet (50 mg total) by mouth daily. 90 tablet 4   ? nitroGLYCERIN (NITROSTAT) 0.4 MG SL tablet Place 1 tablet (0.4 mg total) under the tongue every 5 (five) minutes as needed for chest pain. 120 tablet 1   ? olmesartan (BENICAR) 5 MG tablet Take 1  tablet (5 mg total) by mouth daily. 90 tablet 4   ? ondansetron (ZOFRAN-ODT) 4 MG disintegrating tablet Take 1 tablet (4 mg total) by mouth every 8 (eight) hours as needed for nausea or vomiting. 12 tablet 0   ? OneTouch Delica Lancets 29J MISC Use to check blood sugar 2-3 times a day. 100 each 5   ? pantoprazole (PROTONIX) 40 MG tablet Take 1 tablet (40 mg total) by mouth daily. 90 tablet 4   ? psyllium (METAMUCIL) 58.6 % packet Take 1 packet by mouth daily. 30 each 12   ? risperiDONE (RISPERDAL) 0.5 MG tablet Take 1 tablet (0.5 mg total) by mouth 2 (two) times daily. 180 tablet 4   ? rosuvastatin (CRESTOR) 20 MG tablet Take 1 tablet (20 mg total) by mouth daily. 90 tablet 3   ? Saccharomyces boulardii (PROBIOTIC) 250 MG CAPS TAKE 1 CAPSULE BY MOUTH EVERY MORNING AND AT BEDTIME 60 capsule 6   ? sharps container 1 each by Does not apply route as needed. 1 each 1   ? Skin Protectants, Misc. (MINERIN CREME) CREA APPLY TOPICALLY TO AFFECTED AREA(S) AS NEEDED FOR DRY SKIN 454 g 1   ? SYMBICORT 80-4.5 MCG/ACT inhaler INHALE 2 PUFFS BY MOUTH 2 TIMES DAILY. RINSE MOUTH AFTER USE. 10.2 g 1   ? triamcinolone cream (KENALOG) 0.1 % Apply 1 application. topically 2 (two) times daily. 30 g 0   ? vitamin B-12 (CYANOCOBALAMIN) 1000 MCG tablet Take 1 tablet (1,000 mcg total) by mouth daily. 90 tablet 4   ? XARELTO 20 MG TABS tablet TAKE 1 TABLET BY MOUTH AT BEDTIME 90 tablet 4   ? ? ?Patient Stressors: Health problems   ? ?Patient Strengths: Motivation for treatment/growth  ? ?Treatment Modalities: Medication Management, Group therapy, Case management,  ?1 to 1 session with clinician, Psychoeducation, Recreational therapy. ? ? ?Physician Treatment Plan for Primary Diagnosis: Schizophrenia ([place]) ?Long Term Goal(s): Improvement in symptoms so as ready for discharge  ? ?Short Term Goals: Ability to identify and develop effective coping behaviors will improve ?Compliance with prescribed medications will improve ?Ability to verbalize  feelings will improve ?Ability to demonstrate self-control will improve ? ?Medication Management: Evaluate patient's response, side effects, and tolerance of medication regimen. ? ?Therapeutic Interventions: 1 to 1 sessions, Unit Group sessions and Medication administration. ? ?Evaluation of Outcomes: Not Met ? ?  Physician Treatment Plan for Secondary Diagnosis: Principal Problem: ?  Schizophrenia ([place]) ?Active Problems: ?  Hypertension associated with diabetes ([place]) ?  Old myocardial infarction ?  Type 2 diabetes mellitus with morbid obesity ([place]) ?  Hyperlipidemia associated with type 2 diabetes mellitus ([place]) ?  CAD (coronary artery disease) ? ?Long Term Goal(s): Improvement in symptoms so as ready for discharge  ? ?Short Term Goals: Ability to identify and develop effective coping behaviors will improve ?Compliance with prescribed medications will improve ?Ability to verbalize feelings will improve ?Ability to demonstrate self-control will improve    ? ?Medication Management: Evaluate patient's response, side effects, and tolerance of medication regimen. ? ?Therapeutic Interventions: 1 to 1 sessions, Unit Group sessions and Medication administration. ? ?Evaluation of Outcomes: Not Met ? ? ?RN Treatment Plan for Primary Diagnosis: Schizophrenia ([place]) ?Long Term Goal(s): Knowledge of disease and therapeutic regimen to maintain health will improve ? ?Short Term Goals: Ability to remain free from injury will improve, Ability to verbalize frustration and anger appropriately will improve, Ability to demonstrate self-control, Ability to participate in decision making will improve, Ability to verbalize feelings will improve, Ability to disclose and discuss suicidal ideas, Ability to identify and develop effective coping behaviors will improve, and Compliance with prescribed medications will improve ? ?Medication Management: RN will administer medications as ordered by provider, will assess and evaluate patient's response  and provide education to patient for prescribed medication. RN will report any adverse and/or side effects to prescribing provider. ? ?Therapeutic Interventions: 1 on 1 counseling sessions, Psycho[facility]

## 2021-12-14 NOTE — Plan of Care (Signed)
D: Pt alert and oriented. Pt denies experiencing any anxiety/depression at this time. Pt denies experiencing any pain at this time. Pt denies experiencing any SI/HI, or AVH at this time.  ? ?Pt is calm, cooperative, and pleasant.  ? ?A: Scheduled medications administered to pt, per MD orders. Support and encouragement provided. Frequent verbal contact made. Routine safety checks conducted q15 minutes.  ? ?R: No adverse drug reactions noted. Pt verbally contracts for safety at this time. Pt compliant with medications and treatment plan. Pt interacts well with others on the unit. Pt remains safe at this time. Will continue to monitor.  ? ?Problem: Education: ?Goal: Emotional status will improve ?Outcome: Progressing ?Goal: Mental status will improve ?Outcome: Progressing ?  ?

## 2021-12-14 NOTE — Progress Notes (Signed)
Recreation Therapy Notes ? ?INPATIENT RECREATION THERAPY ASSESSMENT ? ?Patient Details ?Name: Evan [name] ?MRN: [id number] ?DOB: July 06, 1956 ?Today's Date: 12/14/2021 ?      ?Information Obtained From: ?Patient ? ?Able to Participate in Assessment/Interview: ?Yes ? ?Patient Presentation: ?Responsive ? ?Reason for Admission (Per Patient): ?Active Symptoms ? ?Patient Stressors: ?Other (Comment) (arms and feet turning purple) ? ?Coping Skills:   ?Talk, Prayer ? ?Leisure Interests (2+):  ?Music - Singing, Exercise - Walking, Social - Friends (Meet new people) ? ?Frequency of Recreation/Participation: ?Weekly ? ?Awareness of Community Resources:  ?Yes ? ?Community Resources:  ?Park ? ?Current Use: ?Yes ? ?If no, Barriers?: ?  ? ?Expressed Interest in [employer] Information: ?Yes ? ?[place] of Residence:  ?[place] ? ?Patient Main Form of Transportation: ?Other (Comment) (the facility) ? ?Patient Strengths:  ?Talking to people ? ?Patient Identified Areas of Improvement:  ?My health ? ?Patient Goal for Hospitalization:  ?Getting into a new place ? ?Current SI (including self-harm):  ?No ? ?Current HI:  ?No ? ?Current AVH: ?No ? ?Staff Intervention Plan: ?Group Attendance, Collaborate with Interdisciplinary Treatment Team ? ?Consent to Intern Participation: ?N/A ? ?Kodah Maret ?12/14/2021, 12:35 PM ?
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
## 2021-12-14 NOTE — Progress Notes (Signed)
Pt came to the nurses station stated he needed his inhaler. PRN medication administered per MD orders. Check MAR  ?

## 2021-12-14 NOTE — Group Note (Signed)
[facility] LCSW Group Therapy Note ? ? ? ?Group Date: 12/14/2021 ?Start Time: 1330 ?End Time: 1430 ? ?Type of Therapy and Topic:  Group Therapy:  Overcoming Obstacles ? ?Participation Level:  [facility] PARTICIPATION LEVEL: Active ? ?Mood: ? ?Description of Group:   ?In this group patients will be encouraged to explore what they see as obstacles to their own wellness and recovery. They will be guided to discuss their thoughts, feelings, and behaviors related to these obstacles. The group will process together ways to cope with barriers, with attention given to specific choices patients can make. Each patient will be challenged to identify changes they are motivated to make in order to overcome their obstacles. This group will be process-oriented, with patients participating in exploration of their own experiences as well as giving and receiving support and challenge from other group members. ? ?Therapeutic Goals: ?1. Patient will identify personal and current obstacles as they relate to admission. ?2. Patient will identify barriers that currently interfere with their wellness or overcoming obstacles.  ?3. Patient will identify feelings, thought process and behaviors related to these barriers. ?4. Patient will identify two changes they are willing to make to overcome these obstacles:  ? ? ?Summary of Patient Progress ?Patient was present in group.  Patient struggled to remain on topic.  Patient was often topic and despite redirection struggled.  Patient discussed in group how he has experienced radiation at home.   ? ? ?Therapeutic Modalities:   ?Cognitive Behavioral Therapy ?Solution Focused Therapy ?Motivational Interviewing ?Relapse Prevention Therapy ? ? ?Rozann Lesches, LCSW ?

## 2021-12-14 NOTE — Progress Notes (Signed)
Recreation Therapy Notes ?  ?Date: 12/14/2021 ?  ?Time: 10:15 am   ?  ?Location: Courtyard    ?  ?Behavioral response: Appropriate ?  ?Intervention Topic: Leisure   ?  ?Discussion/Intervention:  ?Group content today was focused on leisure. The group defined what leisure is and some positive leisure activities they participate in. Individuals identified the difference between good and bad leisure. Participants expressed how they feel after participating in the leisure of their choice. The group discussed how they go about picking a leisure activity and if others are involved in their leisure activities. The patient stated how many leisure activities they have to choose from and reasons why it is important to have leisure time. Individuals participated in the intervention ?Exploration of Leisure? where they had a chance to identify new leisure activities as well as benefits of leisure. ?Clinical Observations/Feedback: ?Patient came to group and was able to explore participate in many leisure activities. Participant was able to identify leisure activities they enjoy outside of the hospital. Individual was social with peers and staff while participating in the intervention.    ?Christopher Hink LRT/CTRS  ? ? ? ? ? ? ? ?Evan [name] ?12/14/2021 12:04 PM ?

## 2021-12-14 NOTE — Progress Notes (Signed)
Recreation Therapy Notes ? ?INPATIENT RECREATION TR PLAN ? ?Patient Details ?Name: Evan [name] ?MRN: [id number] ?DOB: 1955/11/10 ?Today's Date: 12/14/2021 ? ?Rec Therapy Plan ?Is patient appropriate for Therapeutic Recreation?: Yes ?Treatment times per week: at least 3 ?Estimated Length of Stay: 5-7 days ?TR Treatment/Interventions: Group participation (Comment) ? ?Discharge Criteria ?Pt will be discharged from therapy if:: Discharged ?Treatment plan/goals/alternatives discussed and agreed upon by:: Patient/family ? ?Discharge Summary ?  ? ? ?Serin Thornell ?12/14/2021, 12:36 PM ?

## 2021-12-15 DIAGNOSIS — F203 Undifferentiated schizophrenia: Secondary | ICD-10-CM | POA: Diagnosis not present

## 2021-12-15 LAB — GLUCOSE, CAPILLARY
Glucose-Capillary: 136 mg/dL — ABNORMAL HIGH (ref 70–99)
Glucose-Capillary: 115 mg/dL — ABNORMAL HIGH (ref 70–99)
Glucose-Capillary: 104 mg/dL — ABNORMAL HIGH (ref 70–99)
Glucose-Capillary: 123 mg/dL — ABNORMAL HIGH (ref 70–99)

## 2021-12-15 MED ORDER — RISPERIDONE 1 MG PO TABS
0.5000 mg | ORAL_TABLET | Freq: Two times a day (BID) | ORAL | Status: DC
  Administered 2021-12-15 – 2021-12-16 (×2): 0.5 mg via ORAL
  Filled 2021-12-15 (×2): qty 1
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
## 2021-12-15 NOTE — Progress Notes (Signed)
[facility] MD Progress Note ? ?12/15/2021 3:17 PM ?Evan [name]  ?MRN:  [id number] ?Subjective: Patient seen and chart reviewed.  No new complaints.  Behavior calm.  Will show evidence of some delusions if you talk with him a while but does not act on them and does not display any dangerous or self-harming behavior ?Principal Problem: Schizophrenia ([place]) ?Diagnosis: Principal Problem: ?  Schizophrenia ([place]) ?Active Problems: ?  Hypertension associated with diabetes ([place]) ?  Old myocardial infarction ?  Type 2 diabetes mellitus with morbid obesity ([place]) ?  Hyperlipidemia associated with type 2 diabetes mellitus ([place]) ?  CAD (coronary artery disease) ? ?Total Time spent with patient: 30 minutes ? ?Past Psychiatric History: Past history of a diagnosis of schizophrenia and probable intellectual disability to some mild degree ? ?Past Medical History:  ?Past Medical History:  ?Diagnosis Date  ? Anginal pain ([place])   ? Anxiety disorder   ? Asthma   ? Atresia of esophagus without fistula   ? CAD (coronary artery disease)   ? Cellulitis   ? CHF (congestive heart failure) ([place])   ? NYHA CLASS III,CHRONIC,DIASTOLIC  ? COPD (chronic obstructive pulmonary disease) ([place])   ? COVID-19   ? Diabetes mellitus without complication ([place])   ? Edema   ? RIGHT LOWER LEG  ? Gastroesophageal reflux   ? H/O: GI bleed   ? History of pneumonia   ? Remote  ? History of scarlet fever   ? Childhood  ? Hyperlipidemia   ? Hypertension   ? Myocardial infarction [facility]) 2009  ? Obesity   ? Obstructive sleep apnea   ? Pain   ? CHRONIC BACK / ABDOMINAL  ? Panic disorder   ? Peripheral venous insufficiency   ? PTSD (post-traumatic stress disorder)   ? Retinopathy   ? DIABETIC  ? Stasis, venous   ? Stroke [facility])   ? Vertigo   ?  ?Past Surgical History:  ?Procedure Laterality Date  ? CARDIAC CATHETERIZATION    ? CATARACT EXTRACTION Left   ? CATARACT EXTRACTION W/PHACO Right 05/03/2017  ? Procedure: CATARACT EXTRACTION PHACO AND INTRAOCULAR LENS PLACEMENT (IOC);   Surgeon: Leandrew Koyanagi, MD;  Location: [facility] ORS;  Service: Ophthalmology;  Laterality: Right;  [place] 00:35.3 ?AP% 12.3 ?CDE 4.33 ?Fluid Pack lot # [id number] ? ? ? ? ?  ? COLONOSCOPY WITH PROPOFOL N/A 11/26/2020  ? Procedure: COLONOSCOPY WITH PROPOFOL;  Surgeon: Jonathon Bellows, MD;  Location: [facility] ENDOSCOPY;  Service: Gastroenterology;  Laterality: N/A;  ANNETTE TO PICK UP [phone]  ? CORONARY ANGIOPLASTY WITH STENT PLACEMENT  2002  ? CORONARY ANGIOPLASTY WITH STENT PLACEMENT  1999  ? CORONARY ARTERY BYPASS GRAFT    ? x7  ? ESOPHAGOGASTRODUODENOSCOPY N/A 09/19/2016  ? Procedure: ESOPHAGOGASTRODUODENOSCOPY (EGD);  Surgeon: Lollie Sails, MD;  Location: [facility] ENDOSCOPY;  Service: Endoscopy;  Laterality: N/A;  ? ESOPHAGOGASTRODUODENOSCOPY (EGD) WITH PROPOFOL  11/26/2020  ? Procedure: ESOPHAGOGASTRODUODENOSCOPY (EGD) WITH PROPOFOL;  Surgeon: Jonathon Bellows, MD;  Location: [facility] ENDOSCOPY;  Service: Gastroenterology;;  ? ?Family History:  ?Family History  ?Problem Relation Age of Onset  ? Heart attack Mother   ? Hypertension Mother   ? Hyperlipidemia Mother   ? Heart attack Brother 89  ?     MI  ? Coronary artery disease Other   ? ?Family Psychiatric  History: See previous ?Social History:  ?Social History  ? ?Substance and Sexual Activity  ?Alcohol Use No  ?   ?Social History  ? ?Substance and Sexual Activity  ?  Drug Use No  ?  ?Social History  ? ?Socioeconomic History  ? Marital status: Single  ?  Spouse name: Not on file  ? Number of children: Not on file  ? Years of education: Not on file  ? Highest education level: Not on file  ?Occupational History  ?  Comment: disabled  ?Tobacco Use  ? Smoking status: Never  ? Smokeless tobacco: Never  ?Vaping Use  ? Vaping Use: Never used  ?Substance and Sexual Activity  ? Alcohol use: No  ? Drug use: No  ? Sexual activity: Not Currently  ?Other Topics Concern  ? Not on file  ?Social History Narrative  ? Disabled  ? Single  ? ?Social Determinants of Health  ? ?Financial Resource  Strain: Low Risk   ? Difficulty of Paying Living Expenses: Not hard at all  ?Food Insecurity: No Food Insecurity  ? Worried About Charity fundraiser in the Last Year: Never true  ? Ran Out of Food in the Last Year: Never true  ?Transportation Needs: No Transportation Needs  ? Lack of Transportation (Medical): No  ? Lack of Transportation (Non-Medical): No  ?Physical Activity: Sufficiently Active  ? Days of Exercise per Week: 7 days  ? Minutes of Exercise per Session: 30 min  ?Stress: No Stress Concern Present  ? Feeling of Stress : Not at all  ?Social Connections: Moderately Integrated  ? Frequency of Communication with Friends and Family: More than three times a week  ? Frequency of Social Gatherings with Friends and Family: More than three times a week  ? Attends Religious Services: More than 4 times per year  ? Active Member of Clubs or Organizations: Yes  ? Attends Archivist Meetings: More than 4 times per year  ? Marital Status: Never married  ? ?Additional Social History:  ?  ?  ?  ?  ?  ?  ?  ?  ?  ?  ?  ? ?Sleep: Fair ? ?Appetite:  Fair ? ?Current Medications: ?Current Facility-Administered Medications  ?Medication Dose Route Frequency Provider Last Rate Last Admin  ? acetaminophen (TYLENOL) tablet 650 mg  650 mg Oral Q6H PRN Guiliana Shor, Madie Reno, MD   650 mg at 12/15/21 0311  ? albuterol (VENTOLIN HFA) 108 (90 Base) MCG/ACT inhaler 2 puff  2 puff Inhalation Q6H PRN Jehieli Brassell, Madie Reno, MD   2 puff at 12/14/21 0255  ? alum & mag hydroxide-simeth (MAALOX/MYLANTA) 200-200-20 MG/5ML suspension 30 mL  30 mL Oral Q4H PRN Daquane Aguilar T, MD      ? aspirin EC tablet 81 mg  81 mg Oral Daily Jadrian Bulman, Madie Reno, MD   81 mg at 12/15/21 0741  ? ezetimibe (ZETIA) tablet 10 mg  10 mg Oral Daily Lovie Agresta, Madie Reno, MD   10 mg at 12/15/21 2876  ? furosemide (LASIX) tablet 40 mg  40 mg Oral QPM Cid Agena T, MD   40 mg at 12/14/21 1816  ? furosemide (LASIX) tablet 80 mg  80 mg Oral Daily Cherissa Hook, Madie Reno, MD   80 mg at  12/15/21 0743  ? insulin aspart (novoLOG) injection 0-9 Units  0-9 Units Subcutaneous TID WC Linder Prajapati, Madie Reno, MD   1 Units at 12/15/21 0741  ? irbesartan (AVAPRO) tablet 37.5 mg  37.5 mg Oral Daily Kaylia Winborne T, MD   37.5 mg at 12/15/21 0743  ? magnesium hydroxide (MILK OF MAGNESIA) suspension 30 mL  30 mL Oral Daily PRN Jacayla Nordell, Madie Reno,  MD      ? metoprolol succinate (TOPROL-XL) 24 hr tablet 50 mg  50 mg Oral Daily Kenadee Gates T, MD   50 mg at 12/15/21 0741  ? nitroGLYCERIN (NITROSTAT) SL tablet 0.4 mg  0.4 mg Sublingual Q5 min PRN Angas Isabell T, MD      ? pantoprazole (PROTONIX) EC tablet 40 mg  40 mg Oral Daily Shandon Burlingame, Madie Reno, MD   40 mg at 12/15/21 0741  ? psyllium (HYDROCIL/METAMUCIL) 1 packet  1 packet Oral Daily Dequane Strahan, Madie Reno, MD   1 packet at 12/15/21 [phone]  ? rivaroxaban (XARELTO) tablet 20 mg  20 mg Oral Q supper Moriah Loughry, Madie Reno, MD   20 mg at 12/14/21 1621  ? rosuvastatin (CRESTOR) tablet 5 mg  5 mg Oral Daily Eldean Nanna, Madie Reno, MD   5 mg at 12/15/21 7681  ? vitamin B-12 (CYANOCOBALAMIN) tablet 1,000 mcg  1,000 mcg Oral Daily Yoona Ishii, Madie Reno, MD   1,000 mcg at 12/15/21 0743  ? ? ?Lab Results:  ?Results for orders placed or performed during the hospital encounter of 12/13/21 (from the past 48 hour(s))  ?Glucose, capillary     Status: Abnormal  ? Collection Time: 12/13/21  5:38 PM  ?Result Value Ref Range  ? Glucose-Capillary 152 (H) 70 - 99 mg/dL  ?  Comment: Glucose reference range applies only to samples taken after fasting for at least 8 hours.  ?Glucose, capillary     Status: Abnormal  ? Collection Time: 12/13/21  9:34 PM  ?Result Value Ref Range  ? Glucose-Capillary 193 (H) 70 - 99 mg/dL  ?  Comment: Glucose reference range applies only to samples taken after fasting for at least 8 hours.  ?Glucose, capillary     Status: Abnormal  ? Collection Time: 12/14/21  6:46 AM  ?Result Value Ref Range  ? Glucose-Capillary 132 (H) 70 - 99 mg/dL  ?  Comment: Glucose reference range applies only to samples  taken after fasting for at least 8 hours.  ?Glucose, capillary     Status: Abnormal  ? Collection Time: 12/14/21 11:54 AM  ?Result Value Ref Range  ? Glucose-Capillary 132 (H) 70 - 99 mg/dL  ?  Comment: Glucos

## 2021-12-15 NOTE — Plan of Care (Signed)
D: Pt alert and oriented. Pt rates depression 0/10, hopelessness 0/10, and anxiety 0/10. Pt goal:"Getting better." Pt reports energy level as normal and concentration as being good. Pt reports sleep last night as being fair. Pt did not receive medications for sleep. Pt denies experiencing any pain at this time. Pt denies experiencing any SI/HI, or AVH at this time.  ? ?A: Scheduled medications administered to pt, per MD orders. Support and encouragement provided. Frequent verbal contact made. Routine safety checks conducted q15 minutes.  ? ?R: No adverse drug reactions noted. Pt verbally contracts for safety at this time. Pt compliant with medications and treatment plan. Pt interacts well with others on the unit. Pt remains safe at this time. Will continue to monitor.  ? ?Problem: Education: ?Goal: Emotional status will improve ?Outcome: Progressing ?  ?Problem: Activity: ?Goal: Interest or engagement in activities will improve ?Outcome: Progressing ?  ?

## 2021-12-15 NOTE — Progress Notes (Signed)
Recreation Therapy Notes ? ? ?Date: 12/15/2021 ?  ?Time: 10:30 am   ?  ?Location: Court yard  ?  ?Behavioral response: N/A ?  ?Intervention Topic: Social Skills  ?  ?Discussion/Intervention: ?Patient refused to attend group.  ?  ?Clinical Observations/Feedback:  ?Patient refused to attend group.  ?  ?[name] LRT/CTRS ?  ?  ? ? ? ? ? ? ? ?[name] ?12/15/2021 11:57 AM ?

## 2021-12-15 NOTE — Progress Notes (Signed)
1800 Lasix not given d/t low BP. MD called, notified, and aware of pt's low BP and agreed for Lasix to not be given. ?

## 2021-12-15 NOTE — BHH Counselor (Signed)
CSW met with DSS SW Evan [name] and coworker Evan [name]. ? ?DSS SW was able to talk to the patient and patient agreed to sign consent for referral for aftercare referrals. CSW obtained consent.  ? ?DSS expressed concerns that patient does not have any psych medications.  CSW expressed that she will ask staff and follow up.  ? ?Assunta Curtis, MSW, LCSW ?12/15/2021 3:51 PM  ?

## 2021-12-15 NOTE — Progress Notes (Signed)
?   12/14/21 2300  ?Psych Admission Type (Psych Patients Only)  ?Admission Status Involuntary  ?Psychosocial Assessment  ?Patient Complaints None  ?Eye Contact Fair  ?Facial Expression Animated  ?Affect Appropriate to circumstance  ?Speech Logical/coherent  ?Interaction Assertive  ?Motor Activity Slow  ?Appearance/Hygiene Unremarkable  ?Behavior Characteristics Cooperative  ?Mood Pleasant  ?Thought Process  ?Coherency Circumstantial  ?Content Delusions;Religiosity  ?Delusions Paranoid;Religious;Grandeur  ?Perception WDL  ?Hallucination None reported or observed  ?Judgment Impaired  ?Confusion None  ?Danger to Self  ?Current suicidal ideation? Denies ?(DENIES)  ?Danger to Others  ?Danger to Others None reported or observed  ? ?Patient continues to be be [place] talking about coming from a rich family of celebrities, Patient is pleasant denies SI/HI/A/VH and stated that the only problem he has is depression. ? ?Support and encouragement provided. Patient ambulating with tri walker. Patient remains safe.  ?

## 2021-12-15 NOTE — Progress Notes (Signed)
[facility] LCSW Progress Note ?  ?12/15/2021    10:09 AM ? ?DRESEAN BECKEL  ? ?[id number]  ? ?Type of Contact and Topic:  Care Coordination  ? ?CSW attempted to reach [employer], [place] social worker, in order to discuss patient aftercare plan. Unable to reach Education officer, museum. Unable to leave a message, voicemail full or not setup. CSW will make additional attempts to reach DSS social worker.  ? ?  ?Signed:  ?Durenda Hurt, MSW, LCSWA, LCAS ?12/15/2021 10:09 AM ?  ?  ?

## 2021-12-15 NOTE — Group Note (Signed)
[place] LCSW Group Therapy Note ? ? ? ?Group Date: 12/15/2021 ?Start Time: 1300 ?End Time: 1400 ? ?Type of Therapy and Topic:  Group Therapy:  Overcoming Obstacles ? ?Participation Level:  [facility] PARTICIPATION LEVEL: Active ? ?Description of Group:   ?In this group patients will be encouraged to explore what they see as obstacles to their own wellness and recovery. They will be guided to discuss their thoughts, feelings, and behaviors related to these obstacles. The group will process together ways to cope with barriers, with attention given to specific choices patients can make. Each patient will be challenged to identify changes they are motivated to make in order to overcome their obstacles. This group will be process-oriented, with patients participating in exploration of their own experiences as well as giving and receiving support and challenge from other group members. ? ?Therapeutic Goals: ?1. Patient will identify personal and current obstacles as they relate to admission. ?2. Patient will identify barriers that currently interfere with their wellness or overcoming obstacles.  ?3. Patient will identify feelings, thought process and behaviors related to these barriers. ?4. Patient will identify two changes they are willing to make to overcome these obstacles:  ? ? ?Summary of Patient Progress ? ? ?Patient was present for the entirety of the group session. Patient was an active listener and participated in the topic of discussion, provided helpful advice to others, and added nuance to topic of conversation. Patient was mostly tangential during conversation. Continues to be delusional, has multiple unrelated jobs, responsibilities, and relationships.  ? ? ?Therapeutic Modalities:   ?Cognitive Behavioral Therapy ?Solution Focused Therapy ?Motivational Interviewing ?Relapse Prevention Therapy ? ? ?Durenda Hurt, LCSWA ?

## 2021-12-16 DIAGNOSIS — F203 Undifferentiated schizophrenia: Secondary | ICD-10-CM | POA: Diagnosis not present

## 2021-12-16 LAB — GLUCOSE, CAPILLARY
Glucose-Capillary: 119 mg/dL — ABNORMAL HIGH (ref 70–99)
Glucose-Capillary: 122 mg/dL — ABNORMAL HIGH (ref 70–99)

## 2021-12-16 MED ORDER — ALBUTEROL SULFATE HFA 108 (90 BASE) MCG/ACT IN AERS: 2.0000 | INHALATION_SPRAY | Freq: Four times a day (QID) | RESPIRATORY_TRACT | 1 refills | Status: DC | PRN

## 2021-12-16 MED ORDER — BUSPIRONE HCL 5 MG PO TABS: 5.0000 mg | ORAL_TABLET | Freq: Two times a day (BID) | ORAL | 1 refills | Status: DC

## 2021-12-16 MED ORDER — RIVAROXABAN 20 MG PO TABS: 20.0000 mg | ORAL_TABLET | Freq: Every day | ORAL | 1 refills | Status: DC

## 2021-12-16 MED ORDER — PSYLLIUM 95 % PO PACK: 1.0000 | PACK | Freq: Every day | ORAL | 1 refills | Status: DC

## 2021-12-16 MED ORDER — METOPROLOL SUCCINATE ER 50 MG PO TB24
50.0000 mg | ORAL_TABLET | Freq: Every day | ORAL | 1 refills | Status: DC
Start: 2021-12-17 — End: 2022-10-06

## 2021-12-16 MED ORDER — RISPERIDONE 0.5 MG PO TABS: 0.5000 mg | ORAL_TABLET | Freq: Two times a day (BID) | ORAL | 1 refills | Status: DC

## 2021-12-16 MED ORDER — EMPAGLIFLOZIN 10 MG PO TABS: 10.0000 mg | ORAL_TABLET | Freq: Every day | ORAL | 1 refills | Status: DC

## 2021-12-16 MED ORDER — IRBESARTAN 75 MG PO TABS
37.5000 mg | ORAL_TABLET | Freq: Every day | ORAL | 1 refills | Status: DC
Start: 2021-12-17 — End: 2021-12-16

## 2021-12-16 MED ORDER — ASPIRIN 81 MG PO TBEC
81.0000 mg | DELAYED_RELEASE_TABLET | Freq: Every day | ORAL | 1 refills | Status: DC
Start: 2021-12-17 — End: 2022-11-24

## 2021-12-16 MED ORDER — FUROSEMIDE 40 MG PO TABS: ORAL_TABLET | ORAL | 1 refills | Status: DC

## 2021-12-16 MED ORDER — NITROGLYCERIN 0.4 MG SL SUBL: 0.4000 mg | SUBLINGUAL_TABLET | SUBLINGUAL | 1 refills | Status: DC | PRN

## 2021-12-16 MED ORDER — OLMESARTAN MEDOXOMIL 5 MG PO TABS: 5.0000 mg | ORAL_TABLET | Freq: Every day | ORAL | 1 refills | Status: DC

## 2021-12-16 MED ORDER — VITAMIN B-12 1000 MCG PO TABS: 1000.0000 ug | ORAL_TABLET | Freq: Every day | ORAL | 1 refills | Status: DC

## 2021-12-16 MED ORDER — ROSUVASTATIN CALCIUM 5 MG PO TABS: 5.0000 mg | ORAL_TABLET | Freq: Every day | ORAL | 1 refills | Status: DC

## 2021-12-16 MED ORDER — PANTOPRAZOLE SODIUM 40 MG PO TBEC: 40.0000 mg | DELAYED_RELEASE_TABLET | Freq: Every day | ORAL | 1 refills | Status: DC

## 2021-12-16 MED ORDER — EZETIMIBE 10 MG PO TABS: 10.0000 mg | ORAL_TABLET | Freq: Every day | ORAL | 1 refills | Status: DC

## 2021-12-16 NOTE — [facility] Suicide Risk Assessment (Signed)
[facility] Discharge Suicide Risk Assessment ? ? ?Principal Problem: Schizophrenia ([place]) ?Discharge Diagnoses: Principal Problem: ?  Schizophrenia ([place]) ?Active Problems: ?  Hypertension associated with diabetes ([place]) ?  Old myocardial infarction ?  Type 2 diabetes mellitus with morbid obesity ([place]) ?  Hyperlipidemia associated with type 2 diabetes mellitus ([place]) ?  CAD (coronary artery disease) ? ? ?Total Time spent with patient: 30 minutes ? ?Musculoskeletal: ?Strength & Muscle Tone: within normal limits ?Gait & Station: normal ?Patient leans: N/A ? ?Psychiatric Specialty Exam ? ?Presentation  ?General Appearance: Casual ? ?Eye Contact:Good ? ?Speech:Clear and Coherent ? ?Speech Volume:Normal ? ?Handedness:Right ? ? ?Mood and Affect  ?Mood:Anxious ? ?Duration of Depression Symptoms: No data recorded ?Affect:Congruent ? ? ?Thought Process  ?Thought Processes:Coherent ? ?Descriptions of Associations:Intact ? ?Orientation:Full (Time, Place and Person) ? ?Thought Content:Logical ? ?History of Schizophrenia/Schizoaffective disorder:Yes ? ?Duration of Psychotic Symptoms:Greater than six months ? ?Hallucinations:No data recorded ?Ideas of Reference:None ? ?Suicidal Thoughts:No data recorded ?Homicidal Thoughts:No data recorded ? ?Sensorium  ?Memory:Immediate Good; Recent Good; Remote Good ? ?Judgment:Fair ? ?Insight:Fair ? ? ?Executive Functions  ?Concentration:Fair ? ?Attention Span:Fair ? ?Recall:Fair ? ?[place] ? ?Language:Fair ? ? ?Psychomotor Activity  ?Psychomotor Activity:No data recorded ? ?Assets  ?Assets:Communication Skills; Desire for Improvement; Financial Resources/Insurance; Housing; Physical Health; Social Support; Transportation ? ? ?Sleep  ?Sleep:No data recorded ? ?Physical Exam: ?Physical Exam ?Vitals and nursing note reviewed.  ?Constitutional:   ?   Appearance: Normal appearance.  ?HENT:  ?   Head: Normocephalic and atraumatic.  ?   Mouth/Throat:  ?   Pharynx: Oropharynx is clear.  ?Eyes:  ?    Pupils: Pupils are equal, round, and reactive to light.  ?Cardiovascular:  ?   Rate and Rhythm: Normal rate and regular rhythm.  ?Pulmonary:  ?   Effort: Pulmonary effort is normal.  ?   Breath sounds: Normal breath sounds.  ?Abdominal:  ?   General: Abdomen is flat.  ?   Palpations: Abdomen is soft.  ?Musculoskeletal:     ?   General: Normal range of motion.  ?Skin: ?   General: Skin is warm and dry.  ?Neurological:  ?   General: No focal deficit present.  ?   Mental Status: He is alert. Mental status is at baseline.  ?Psychiatric:     ?   Attention and Perception: Attention normal.     ?   Mood and Affect: Mood normal.     ?   Speech: Speech normal.     ?   Behavior: Behavior normal.     ?   Thought Content: Thought content is delusional. Thought content does not include homicidal or suicidal ideation.     ?   Cognition and Memory: Cognition is impaired.     ?   Judgment: Judgment normal.  ? ?Review of Systems  ?Constitutional: Negative.   ?HENT: Negative.    ?Eyes: Negative.   ?Respiratory: Negative.    ?Cardiovascular: Negative.   ?Gastrointestinal: Negative.   ?Musculoskeletal: Negative.   ?Skin: Negative.   ?Neurological: Negative.   ?Psychiatric/Behavioral: Negative.    ?Blood pressure (!) 159/89, pulse 72, temperature (!) 97.5 ?F (36.4 ?C), resp. rate 18, height '5\' 5"'$  (1.651 m), weight 99.8 kg, SpO2 99 %. Body mass index is 36.61 kg/m?. ? ?Mental Status Per Nursing Assessment::   ?On Admission:  NA ? ?Demographic Factors:  ?Male ? ?Loss Factors: ?Decline in physical health ? ?Historical Factors: ?Impulsivity ? ?Risk Reduction Factors:   ?  Living with another person, especially a relative, Positive social support, and Positive therapeutic relationship ? ?Continued Clinical Symptoms:  ?Schizophrenia:   Paranoid or undifferentiated type ? ?Cognitive Features That Contribute To Risk:  ?None   ? ?Suicide Risk:  ?Minimal: No identifiable suicidal ideation.  Patients presenting with no risk factors but with morbid  ruminations; may be classified as minimal risk based on the severity of the depressive symptoms ? ? Follow-up Information   ? ? Care, [facility].   ?Why: Appointment is scheduled for 12/27/2021 at 10:00AM with Dr. Kenton Kingfisher.Please bring insurance card, ID and list of medications.  Please arrive at 9:30 to complete forms. ?Contact information: ?[address] ?[place] [address] ?[phone] ? ? ?  ?  ? ?  ?  ? ?  ? ? ?Plan Of Care/Follow-up recommendations:  ?Patient is calm and appropriate and showing no dangerous behaviors.  Completely denies suicidal ideation.  No longer needs inpatient treatment and will be discharged back to his group home ? ?Alethia Berthold, MD ?12/16/2021, 11:16 AM ?

## 2021-12-16 NOTE — Progress Notes (Signed)
Recreation Therapy Notes ? ?  ?Date: 12/16/2021 ?  ?Time: 10:40 am  ? ?Location: Craft room    ?  ?Behavioral response: Appropriate ?  ?Intervention Topic: Stress Management   ?  ?Discussion/Intervention:  ?Group content on today was focused on stress. The group defined stress and way to cope with stress. Participants expressed how they know when they are stresses out. Individuals described the different ways they have to cope with stress. The group stated reasons why it is important to cope with stress. Patient explained what good stress is and some examples. The group participated in the intervention ?Stress Management?. Individuals were separated into two group and answered questions related to stress.  ?Clinical Observations/Feedback: ?Patient came to group and defined stress management as handling problems. He stated that he manages his stress by staying calm and listening. Participant explained that stress management is important to keep control of what is going on in life. Individual was social with peers and staff while participating in the intervention.    ?Milianna Ericsson LRT/CTRS  ? ? ? ? ? ? ? ? ?Rodolphe Edmonston ?12/16/2021 11:37 AM ?

## 2021-12-16 NOTE — Progress Notes (Signed)
Patient ID: Evan [name], male   DOB: [birth year], 66 y.o.   MRN: [id number] ? ?Discharge Note: ? ?Patient denies SI/HIAVH at this time. Discharge instructions, AVS, prescriptions, and transition record gone over with patient. Patient agrees to comply with medication management, follow-up visit, and outpatient therapy. Patient belongings returned to patient. Patient questions and concerns addressed and answered. Patient ambulatory off unit, with his three-wheel rollator. Patient discharged back to group home with group home staff.  ? ? ?

## 2021-12-16 NOTE — Care Management Important Message (Signed)
Important Message ? ?Patient Details  ?Name: Evan [name] ?MRN: [id number] ?Date of Birth: 23-Oct-1955 ? ? ?Medicare Important Message Given:  Yes ? ?Patient reports no plans to appeal proposed discharge.  ? ? ?Rozann Lesches, LCSW ?12/16/2021, 10:43 AM ?

## 2021-12-16 NOTE — Progress Notes (Signed)
D- Patient alert and oriented. Patient presents in a pleasant mood on assessment stating that he slept "pretty good" last night and had complaints of right elbow pain. Patient rated his pain level a "6/10", in which he did request PRN pain medication from this Probation officer. Patient denies SI, HI, and AVH at this time. Patient also denies signs/symptoms of depression and anxiety, stating that "I'm just trying to deal with the changes in my body". Patient's goal for today is "trying to get my life back together". ? ?A- Scheduled medications administered to patient, per MD orders. Support and encouragement provided.  Routine safety checks conducted every 15 minutes.  Patient informed to notify staff with problems or concerns. ? ?R- No adverse drug reactions noted. Patient contracts for safety at this time. Patient compliant with medications and treatment plan. Patient receptive, calm, and cooperative. Patient interacts well with others on the unit.  Patient remains safe at this time. ? ?

## 2021-12-16 NOTE — [place] FL2 (Signed)
?[place] MEDICAID FL2 LEVEL OF CARE SCREENING TOOL  ?  ? ?IDENTIFICATION  ?Patient Name: ?Evan [name] Birthdate: Sep 06, 1955 Sex: male Admission Date (Current Location): ?12/13/2021  ?[place] and [place] Number: ? [place] ?  Facility and Address:  ?[facility], [address], [place], [place] [address] ?     Provider Number: ?[id number]  ?Attending Physician Name and Address:  ?Clapacs, Madie Reno, MD ? Relative Name and Phone Number:  ?  ?   ?Current Level of Care: ?Hospital Recommended Level of Care: ?Assisted Living Facility, [facility], Other (Comment) (Group Home) Prior Approval Number: ?  ? ?Date Approved/Denied: ?  PASRR Number: ?  ? ?Discharge Plan: ?Home ?  ? ?Current Diagnoses: ?Patient Active Problem List  ? Diagnosis Date Noted  ? Schizophrenia ([place]) 12/13/2021  ? Psychosis ([place])   ? Elevated ferritin level 11/11/2021  ? Memory changes 08/12/2021  ? Other thrombophilia ([place]) 12/25/2020  ? Vitamin D deficiency 12/04/2020  ? Vitamin B12 deficiency 12/04/2020  ? Stenosis of right carotid artery 12/03/2020  ? History of CVA (cerebrovascular accident) 11/06/2020  ? History of 2019 novel coronavirus disease (COVID-19) 09/27/2020  ? Atherosclerosis of aorta ([place]) 12/04/2019  ? Persistent atrial fibrillation ([place]) 09/12/2019  ? CAD (coronary artery disease) 09/12/2019  ? Chronic venous stasis 09/12/2019  ? Hoarding disorder 09/12/2019  ? Cervical spinal stenosis 09/12/2019  ? Diabetic retinopathy ([place]) 09/12/2019  ? COPD, mild ([place]) 09/12/2019  ? Osteoporosis 09/09/2019  ? History of prostate cancer 09/09/2019  ? Paranoid schizophrenia ([place]) 08/10/2019  ? Obstructive sleep apnea on CPAP 08/10/2019  ? Hyperlipidemia associated with type 2 diabetes mellitus ([place]) 08/10/2019  ? Chronic diastolic CHF (congestive heart failure) ([place]) 01/09/2014  ? Esophageal dysmotility 09/12/2013  ? Type 2 diabetes mellitus with morbid obesity ([place]) 02/14/2012  ? Obesity, morbid (more than 100 lbs  over ideal weight or BMI > 40) ([place]) 07/26/2011  ? Old myocardial infarction 03/02/2010  ? Hypertension associated with diabetes ([place]) 03/20/2009  ? CORONARY ATHEROSCLEROSIS, ARTERY BYPASS GRAFT 03/20/2009  ? ? ?Orientation RESPIRATION BLADDER Height & Weight   ?  ?Self, Time, Situation, Place ? Normal Continent Weight: 220 lb 0.3 oz (99.8 kg) ?Height:  '5\' 5"'$  (165.1 cm)  ?BEHAVIORAL SYMPTOMS/MOOD NEUROLOGICAL BOWEL NUTRITION STATUS  ? (NA)  (NA) Continent Diet (Carb modified diet)  ?AMBULATORY STATUS COMMUNICATION OF NEEDS Skin   ?Independent (Pt uses a 3 wheel rollator.) Verbally Normal ?  ?  ?  ?    ?     ?     ? ? ?Personal Care Assistance Level of Assistance  ?    ?  ?  ?   ? ?Functional Limitations Info  ? (NA)   ?  ?   ? ? ?SPECIAL CARE FACTORS FREQUENCY  ?Blood pressure (Diabetes)   ?  ?  ?  ?  ?  ?  ?   ? ? ?Contractures Contractures Info: Not present  ? ? ?Additional Factors Info  ?Code Status, Allergies Code Status Info: Full ?Allergies Info: Penicillin G, Sulfa Antibiotics, Tiotropium, Zoloft (sertraline Hcl) ?  ?  ?  ?   ? ?Current Medications (12/16/2021):  This is the current hospital active medication list ?Current Facility-Administered Medications  ?Medication Dose Route Frequency Provider Last Rate Last Admin  ? acetaminophen (TYLENOL) tablet 650 mg  650 mg Oral Q6H PRN Clapacs, Madie Reno, MD   650 mg at 12/16/21 0823  ? albuterol (VENTOLIN HFA) 108 (90 Base) MCG/ACT  inhaler 2 puff  2 puff Inhalation Q6H PRN Clapacs, Madie Reno, MD   2 puff at 12/16/21 0334  ? alum & mag hydroxide-simeth (MAALOX/MYLANTA) 200-200-20 MG/5ML suspension 30 mL  30 mL Oral Q4H PRN Clapacs, John T, MD      ? aspirin EC tablet 81 mg  81 mg Oral Daily Clapacs, Madie Reno, MD   81 mg at 12/16/21 0177  ? ezetimibe (ZETIA) tablet 10 mg  10 mg Oral Daily Clapacs, Madie Reno, MD   10 mg at 12/16/21 9390  ? furosemide (LASIX) tablet 40 mg  40 mg Oral QPM Clapacs, Madie Reno, MD   40 mg at 12/14/21 1816  ? furosemide (LASIX) tablet 80 mg  80 mg Oral  Daily Clapacs, Madie Reno, MD   80 mg at 12/16/21 0825  ? insulin aspart (novoLOG) injection 0-9 Units  0-9 Units Subcutaneous TID WC Clapacs, Madie Reno, MD   1 Units at 12/15/21 0741  ? irbesartan (AVAPRO) tablet 37.5 mg  37.5 mg Oral Daily Clapacs, John T, MD   37.5 mg at 12/16/21 0825  ? magnesium hydroxide (MILK OF MAGNESIA) suspension 30 mL  30 mL Oral Daily PRN Clapacs, John T, MD      ? metoprolol succinate (TOPROL-XL) 24 hr tablet 50 mg  50 mg Oral Daily Clapacs, John T, MD   50 mg at 12/16/21 3009  ? nitroGLYCERIN (NITROSTAT) SL tablet 0.4 mg  0.4 mg Sublingual Q5 min PRN Clapacs, John T, MD      ? pantoprazole (PROTONIX) EC tablet 40 mg  40 mg Oral Daily Clapacs, Madie Reno, MD   40 mg at 12/16/21 2330  ? psyllium (HYDROCIL/METAMUCIL) 1 packet  1 packet Oral Daily Clapacs, Madie Reno, MD   1 packet at 12/16/21 0825  ? risperiDONE (RISPERDAL) tablet 0.5 mg  0.5 mg Oral BID Clapacs, Madie Reno, MD   0.5 mg at 12/16/21 0762  ? rivaroxaban (XARELTO) tablet 20 mg  20 mg Oral Q supper Clapacs, Madie Reno, MD   20 mg at 12/15/21 1651  ? rosuvastatin (CRESTOR) tablet 5 mg  5 mg Oral Daily Clapacs, Madie Reno, MD   5 mg at 12/16/21 0825  ? vitamin B-12 (CYANOCOBALAMIN) tablet 1,000 mcg  1,000 mcg Oral Daily Clapacs, Madie Reno, MD   1,000 mcg at 12/16/21 0825  ? ? ? ?Discharge Medications: ?Please see discharge summary for a list of discharge medications. ? ?Relevant Imaging Results: ? ?Relevant Lab Results: ? ? ?Additional Information ?  ? ?Rozann Lesches, LCSW ? ? ? ? ?

## 2021-12-16 NOTE — Progress Notes (Signed)
Cooperative with treatment, denies SI,HI or AVH. Patient was visible interacting with staff and peers in the milieu. ?

## 2021-12-16 NOTE — Plan of Care (Signed)
?  Problem: Coping Skills ?Goal: STG - Patient will identify 3 positive coping skills strategies to use post d/c within 5 recreation therapy group sessions ?Description: STG - Patient will identify 3 positive coping skills strategies to use post d/c within 5 recreation therapy group sessions ?12/16/2021 1149 by Ernest Haber, LRT ?Outcome: Adequate for Discharge ?12/16/2021 1149 by Ernest Haber, LRT ?Outcome: Adequate for Discharge ?  ?

## 2021-12-16 NOTE — Discharge Summary (Signed)
Physician Discharge Summary Note ? ?Patient:  Evan [name] is an 66 y.o., male ?MRN:  [id number] ?DOB:  Dec 29, 1955 ?Patient phone:  [phone] (home)  ?Patient address:   ?[address] ?[place] [address],  ?Total Time spent with patient: 30 minutes ? ?Date of Admission:  12/13/2021 ?Date of Discharge: 12/16/2021 ? ?Reason for Admission: Patient was admitted after presenting to the emergency room with reports that he had made a suicidal statement and had an increase in psychotic symptoms ? ?Principal Problem: Schizophrenia ([place]) ?Discharge Diagnoses: Principal Problem: ?  Schizophrenia ([place]) ?Active Problems: ?  Hypertension associated with diabetes ([place]) ?  Old myocardial infarction ?  Type 2 diabetes mellitus with morbid obesity ([place]) ?  Hyperlipidemia associated with type 2 diabetes mellitus ([place]) ?  CAD (coronary artery disease) ? ? ?Past Psychiatric History: Past history of schizophrenia although not a very consistent diagnosis.  No history of serious dangerous behavior ? ?Past Medical History:  ?Past Medical History:  ?Diagnosis Date  ? Anginal pain ([place])   ? Anxiety disorder   ? Asthma   ? Atresia of esophagus without fistula   ? CAD (coronary artery disease)   ? Cellulitis   ? CHF (congestive heart failure) ([place])   ? NYHA CLASS III,CHRONIC,DIASTOLIC  ? COPD (chronic obstructive pulmonary disease) ([place])   ? COVID-19   ? Diabetes mellitus without complication ([place])   ? Edema   ? RIGHT LOWER LEG  ? Gastroesophageal reflux   ? H/O: GI bleed   ? History of pneumonia   ? Remote  ? History of scarlet fever   ? Childhood  ? Hyperlipidemia   ? Hypertension   ? Myocardial infarction [facility]) 2009  ? Obesity   ? Obstructive sleep apnea   ? Pain   ? CHRONIC BACK / ABDOMINAL  ? Panic disorder   ? Peripheral venous insufficiency   ? PTSD (post-traumatic stress disorder)   ? Retinopathy   ? DIABETIC  ? Stasis, venous   ? Stroke [facility])   ? Vertigo   ?  ?Past Surgical History:  ?Procedure Laterality Date  ? CARDIAC  CATHETERIZATION    ? CATARACT EXTRACTION Left   ? CATARACT EXTRACTION W/PHACO Right 05/03/2017  ? Procedure: CATARACT EXTRACTION PHACO AND INTRAOCULAR LENS PLACEMENT (IOC);  Surgeon: Leandrew Koyanagi, MD;  Location: [facility] ORS;  Service: Ophthalmology;  Laterality: Right;  [place] 00:35.3 ?AP% 12.3 ?CDE 4.33 ?Fluid Pack lot # [id number] H ? ? ? ? ?  ? COLONOSCOPY WITH PROPOFOL N/A 11/26/2020  ? Procedure: COLONOSCOPY WITH PROPOFOL;  Surgeon: Jonathon Bellows, MD;  Location: [facility] ENDOSCOPY;  Service: Gastroenterology;  Laterality: N/A;  ANNETTE TO PICK UP [phone]  ? CORONARY ANGIOPLASTY WITH STENT PLACEMENT  2002  ? CORONARY ANGIOPLASTY WITH STENT PLACEMENT  1999  ? CORONARY ARTERY BYPASS GRAFT    ? x7  ? ESOPHAGOGASTRODUODENOSCOPY N/A 09/19/2016  ? Procedure: ESOPHAGOGASTRODUODENOSCOPY (EGD);  Surgeon: Lollie Sails, MD;  Location: [facility] ENDOSCOPY;  Service: Endoscopy;  Laterality: N/A;  ? ESOPHAGOGASTRODUODENOSCOPY (EGD) WITH PROPOFOL  11/26/2020  ? Procedure: ESOPHAGOGASTRODUODENOSCOPY (EGD) WITH PROPOFOL;  Surgeon: Jonathon Bellows, MD;  Location: [facility] ENDOSCOPY;  Service: Gastroenterology;;  ? ?Family History:  ?Family History  ?Problem Relation Age of Onset  ? Heart attack Mother   ? Hypertension Mother   ? Hyperlipidemia Mother   ? Heart attack Brother 21  ?     MI  ? Coronary artery disease Other   ? ?Family Psychiatric  History: None reported ?Social History:  ?Social  History  ? ?Substance and Sexual Activity  ?Alcohol Use No  ?   ?Social History  ? ?Substance and Sexual Activity  ?Drug Use No  ?  ?Social History  ? ?Socioeconomic History  ? Marital status: Single  ?  Spouse name: Not on file  ? Number of children: Not on file  ? Years of education: Not on file  ? Highest education level: Not on file  ?Occupational History  ?  Comment: disabled  ?Tobacco Use  ? Smoking status: Never  ? Smokeless tobacco: Never  ?Vaping Use  ? Vaping Use: Never used  ?Substance and Sexual Activity  ? Alcohol use: No  ? Drug use: No  ?  Sexual activity: Not Currently  ?Other Topics Concern  ? Not on file  ?Social History Narrative  ? Disabled  ? Single  ? ?Social Determinants of Health  ? ?Financial Resource Strain: Low Risk   ? Difficulty of Paying Living Expenses: Not hard at all  ?Food Insecurity: No Food Insecurity  ? Worried About Charity fundraiser in the Last Year: Never true  ? Ran Out of Food in the Last Year: Never true  ?Transportation Needs: No Transportation Needs  ? Lack of Transportation (Medical): No  ? Lack of Transportation (Non-Medical): No  ?Physical Activity: Sufficiently Active  ? Days of Exercise per Week: 7 days  ? Minutes of Exercise per Session: 30 min  ?Stress: No Stress Concern Present  ? Feeling of Stress : Not at all  ?Social Connections: Moderately Integrated  ? Frequency of Communication with Friends and Family: More than three times a week  ? Frequency of Social Gatherings with Friends and Family: More than three times a week  ? Attends Religious Services: More than 4 times per year  ? Active Member of Clubs or Organizations: Yes  ? Attends Archivist Meetings: More than 4 times per year  ? Marital Status: Never married  ? ? ?Hospital Course: Patient did not display any dangerous aggressive violent or suicidal behaviors.  He was consistently compliant with medication.  Occasionally if the topic was raised he would make what appeared to be some delusional statements about people he thought he was related to but he did not show any sign of being aggressive or agitated about this and for the most part he went about his business without difficulty.  Patient does not show any current need for further psychiatric hospitalization.  We did our best to maintain his medical treatment while he was in the hospital.  No real change to that ? ?Physical Findings: ?AIMS:  , ,  ,  ,    ?CIWA:    ?COWS:    ? ?Musculoskeletal: ?Strength & Muscle Tone: within normal limits ?Gait & Station: normal ?Patient leans:  N/A ? ? ?Psychiatric Specialty Exam: ? ?Presentation  ?General Appearance: Casual ? ?Eye Contact:Good ? ?Speech:Clear and Coherent ? ?Speech Volume:Normal ? ?Handedness:Right ? ? ?Mood and Affect  ?Mood:Anxious ? ?Affect:Congruent ? ? ?Thought Process  ?Thought Processes:Coherent ? ?Descriptions of Associations:Intact ? ?Orientation:Full (Time, Place and Person) ? ?Thought Content:Logical ? ?History of Schizophrenia/Schizoaffective disorder:Yes ? ?Duration of Psychotic Symptoms:Greater than six months ? ?Hallucinations:No data recorded ?Ideas of Reference:None ? ?Suicidal Thoughts:No data recorded ?Homicidal Thoughts:No data recorded ? ?Sensorium  ?Memory:Immediate Good; Recent Good; Remote Good ? ?Judgment:Fair ? ?Insight:Fair ? ? ?Executive Functions  ?Concentration:Fair ? ?Attention Span:Fair ? ?Recall:Fair ? ?[place] ? ?Language:Fair ? ? ?Psychomotor Activity  ?Psychomotor Activity:No data  recorded ? ?Assets  ?Assets:Communication Skills; Desire for Improvement; Financial Resources/Insurance; Housing; Physical Health; Social Support; Transportation ? ? ?Sleep  ?Sleep:No data recorded ? ? ?Physical Exam: ?Physical Exam ?Vitals and nursing note reviewed.  ?Constitutional:   ?   Appearance: Normal appearance.  ?HENT:  ?   Head: Normocephalic and atraumatic.  ?   Mouth/Throat:  ?   Pharynx: Oropharynx is clear.  ?Eyes:  ?   Pupils: Pupils are equal, round, and reactive to light.  ?Cardiovascular:  ?   Rate and Rhythm: Normal rate and regular rhythm.  ?Pulmonary:  ?   Effort: Pulmonary effort is normal.  ?   Breath sounds: Normal breath sounds.  ?Abdominal:  ?   General: Abdomen is flat.  ?   Palpations: Abdomen is soft.  ?Musculoskeletal:     ?   General: Normal range of motion.  ?Skin: ?   General: Skin is warm and dry.  ?Neurological:  ?   General: No focal deficit present.  ?   Mental Status: He is alert. Mental status is at baseline.  ?Psychiatric:     ?   Attention and Perception: Attention  normal.     ?   Mood and Affect: Mood normal.     ?   Speech: Speech normal.     ?   Behavior: Behavior normal.     ?   Thought Content: Thought content normal.     ?   Cognition and Memory: Cognition normal.     ?   Judgment:

## 2021-12-16 NOTE — Progress Notes (Signed)
Recreation Therapy Notes ? ?INPATIENT RECREATION TR PLAN ? ?Patient Details ?Name: Evan [name] ?MRN: [id number] ?DOB: 12-26-1955 ?Today's Date: 12/16/2021 ? ?Rec Therapy Plan ?Is patient appropriate for Therapeutic Recreation?: Yes ?Treatment times per week: at least 3 ?Estimated Length of Stay: 5-7 days ?TR Treatment/Interventions: Group participation (Comment) ? ?Discharge Criteria ?Pt will be discharged from therapy if:: Discharged ?Treatment plan/goals/alternatives discussed and agreed upon by:: Patient/family ? ?Discharge Summary ?Short term goals set: Patient will identify 3 positive coping skills strategies to use post d/c within 5 recreation therapy group sessions ?Short term goals met: Adequate for discharge ?Progress toward goals comments: Groups attended ?Which groups?: Stress management, Leisure education ?Reason goals not met: N/A ?Therapeutic equipment acquired: N/A ?Reason patient discharged from therapy: Discharge from hospital ?Pt/family agrees with progress & goals achieved: Yes ?Date patient discharged from therapy: 12/16/21 ? ? ?Domonik Levario ?12/16/2021, 11:50 AM ?

## 2021-12-16 NOTE — Progress Notes (Signed)
?  [facility] Adult Case Management Discharge Plan : ? ?Will you be returning to the same living situation after discharge:  Yes,  pt reports that he is returning to the group home ?At discharge, do you have transportation home?: Yes,  Group Home will provide transportaiton ?Do you have the ability to pay for your medications: Yes,  [employer] Medicaid and [facility] Medicare ? ?Release of information consent forms completed and in the chart;  Patient's signature needed at discharge. ? ?Patient to Follow up at: ? Follow-up Information   ? ? [facility].   ?Why: Appointment is scheduled for 12/27/2021 at 10:00AM with Dr. Kenton Kingfisher.Please bring insurance card, ID and list of medications.  Please arrive at 9:30 to complete forms. ?Contact information: ?[address] ?[place] [address] ?[phone] ? ? ?  ?  ? ?  ?  ? ?  ? ? ?Next level of care provider has access to [place] ? ?Safety Planning and Suicide Prevention discussed: Yes,  SPE completed with DSS caseworker and the patient. ? ?  ? ?Has patient been referred to the Quitline?: N/A patient is not a smoker ? ?Patient has been referred for addiction treatment: N/A ? ?Rozann Lesches, LCSW ?12/16/2021, 11:15 AM ?

## 2021-12-19 ENCOUNTER — Telehealth: Payer: Self-pay | Admitting: *Deleted

## 2021-12-19 NOTE — Telephone Encounter (Signed)
Transition Care Management Follow-up Telephone Call ?Date of discharge and from where: [facility] 12-16-2021 ?How have you been since you were released from the hospital?   Feeling better  ?Any questions or concerns? No ? ?Items Reviewed: ?Did the pt receive and understand the discharge instructions provided? The nurse was with patient he just had eyes dialated.   Patient was advised of appointment and confirmed it with his nurse ?Medications obtained and verified? No  ?Other? No  ?Any new allergies since your discharge? No  ?Dietary orders reviewed? No ?Do you have support at home? Yes  ? ?Home Care and Equipment/Supplies: ?Were home health services ordered? not applicable ?If so, what is the name of the agency?   ?Has the agency set up a time to come to the patient's home? not applicable ?Were any new equipment or medical supplies ordered?  No ?What is the name of the medical supply agency?  ?Were you able to get the supplies/equipment? not applicable ?Do you have any questions related to the use of the equipment or supplies? No ? ?Functional Questionnaire: (I = Independent and D = Dependent) ?ADLs:  ? ?Bathing/Dressing- i ? ?Meal Prep- i ? ?Eating- i ? ?Maintaining continence- i ? ?Transferring/Ambulation- i ? ?Managing Meds- i ? ?Follow up appointments reviewed: ? ?PCP Hospital f/u appt confirmed?  no. ?[facility] f/u appt confirmed? Yes  Scheduled to see 12-27-2021 @ 10:00 show up at 9:30 with insurance and ID on Dr. Kenton Kingfisher @ [place]. ?Are transportation arrangements needed? No  ?If their condition worsens, is the pt aware to call PCP or go to the Emergency Dept.? Yes ?Was the patient provided with contact information for the PCP's office or ED? Yes ?Was to pt encouraged to call back with questions or concerns? Yes  ?

## 2021-12-22 ENCOUNTER — Ambulatory Visit (INDEPENDENT_AMBULATORY_CARE_PROVIDER_SITE_OTHER): Payer: Medicaid Other | Admitting: Licensed Clinical Social Worker

## 2021-12-22 DIAGNOSIS — E1169 Type 2 diabetes mellitus with other specified complication: Secondary | ICD-10-CM

## 2021-12-22 DIAGNOSIS — F200 Paranoid schizophrenia: Secondary | ICD-10-CM

## 2021-12-22 DIAGNOSIS — E1159 Type 2 diabetes mellitus with other circulatory complications: Secondary | ICD-10-CM

## 2021-12-28 NOTE — Chronic Care Management (AMB) (Signed)
  Care Management  Collaboration  Note  12/28/2021 Name: Evan [name] MRN: [id number] DOB: [birth year]  Evan [name] is a 66 y.o. year old male who is a primary care patient of [name], [name], NP. The CCM team was consulted reference care coordination needs for Mental Health Counseling and Resources.  Assessment: Patient was not interviewed or contacted during this encounter.    Intervention:Conducted brief assessment, recommendations and relevant information discussed.  CCM LCSW collaborated with Dellia Nims, DSS Case Manager  to assist with meeting patient's needs.    Follow up Plan: Patient would like continued follow-up from CCM LCSW.  per patient's request will follow up in 4-6 weeks.  Will call office if needed prior to next encounter.   Review of patient past medical history, allergies, medications, and health status, including review of pertinent consultant reports was performed as part of comprehensive evaluation and provision of care management/care coordination services.   [name], MSW, [email or web address] Phone [phone] 6:15 AM

## 2021-12-29 ENCOUNTER — Telehealth: Payer: Self-pay | Admitting: Nurse Practitioner

## 2021-12-29 ENCOUNTER — Ambulatory Visit
Admission: RE | Admit: 2021-12-29 | Discharge: 2021-12-29 | Disposition: A | Discharge status: Home / Self Care | Payer: Medicare Other | Source: Ambulatory Visit | Attending: Nurse Practitioner | Admitting: Nurse Practitioner

## 2021-12-29 ENCOUNTER — Other Ambulatory Visit: Payer: Self-pay | Admitting: Nurse Practitioner

## 2021-12-29 ENCOUNTER — Other Ambulatory Visit: Payer: Medicaid Other

## 2021-12-29 ENCOUNTER — Other Ambulatory Visit: Payer: Self-pay

## 2021-12-29 ENCOUNTER — Ambulatory Visit
Admission: RE | Admit: 2021-12-29 | Discharge: 2021-12-29 | Disposition: A | Discharge status: Home / Self Care | Payer: Medicare Other | Attending: Nurse Practitioner | Admitting: Nurse Practitioner

## 2021-12-29 DIAGNOSIS — Z111 Encounter for screening for respiratory tuberculosis: Secondary | ICD-10-CM

## 2021-12-29 IMAGING — DX DG CHEST 2V
3 series · 3 of 3 positions shown · non-contrast
Comparison: [DATE]

CLINICAL DATA: Screening for pulmonary tuberculosis

EXAM:
CHEST - 2 VIEW

[chest pa (1 of 2)]
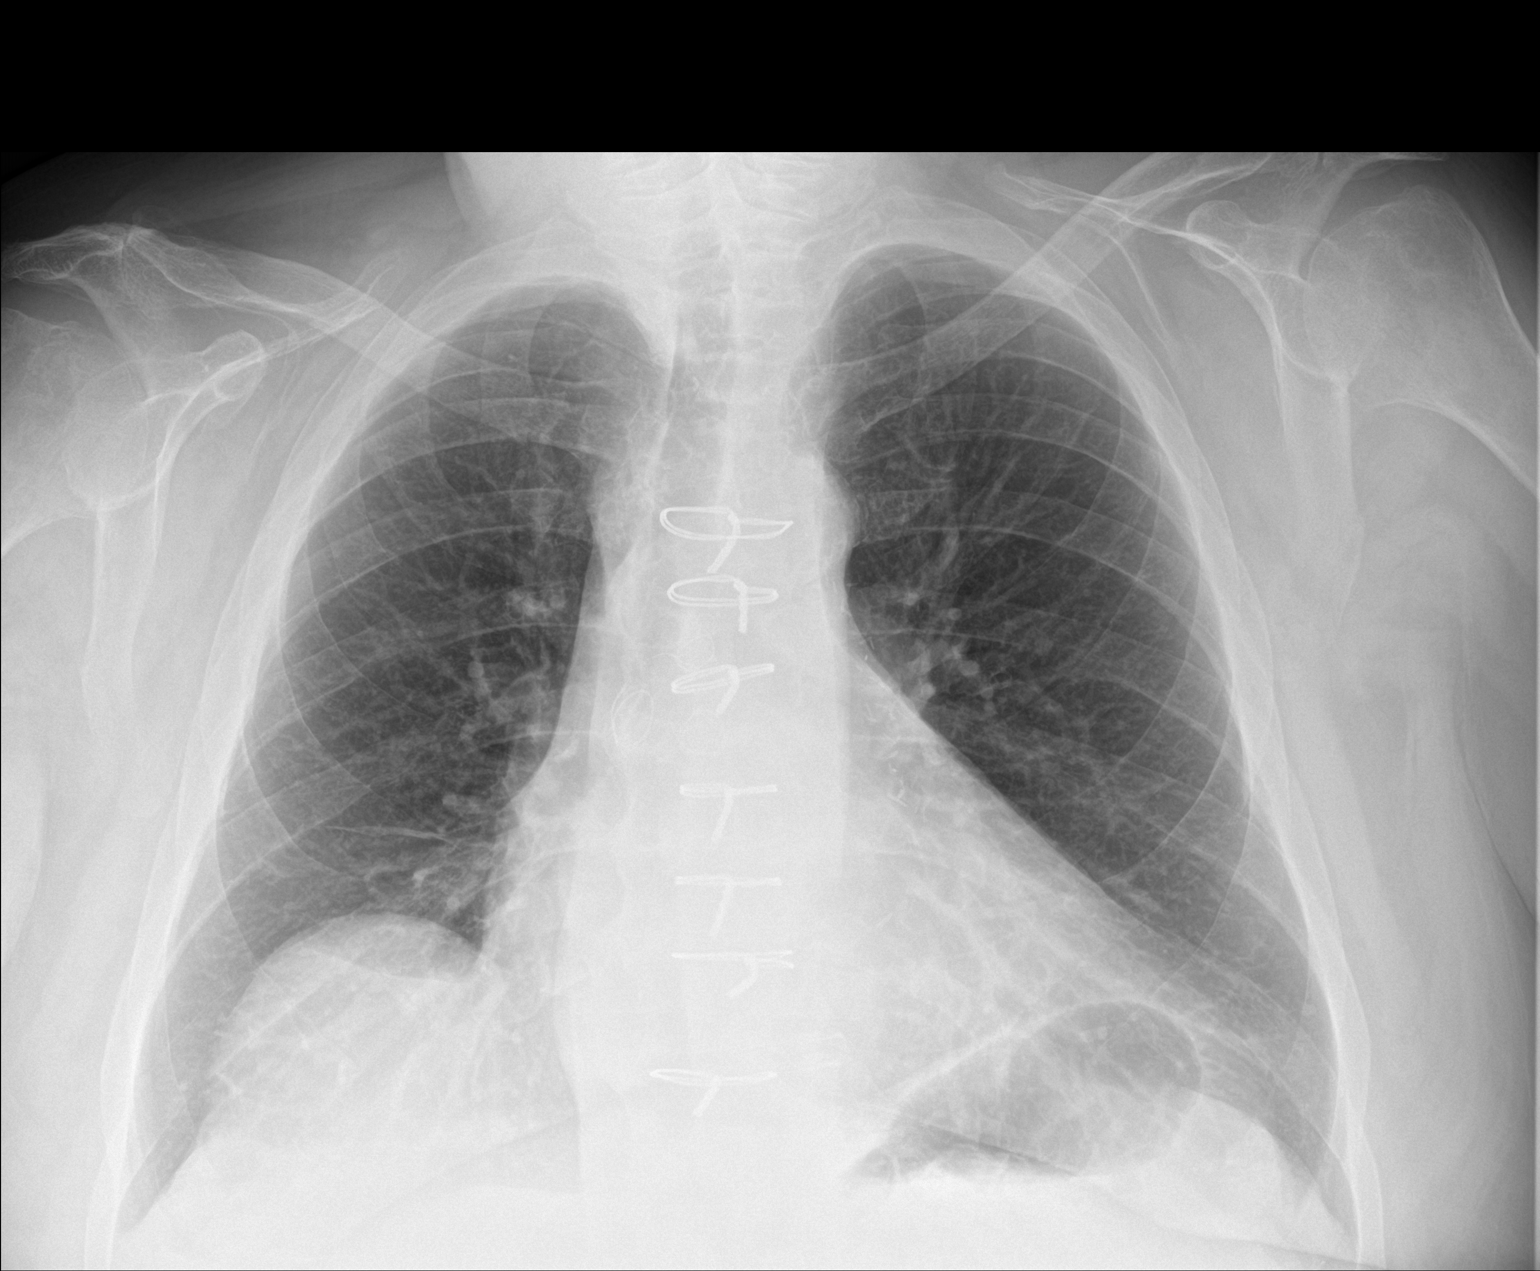

[chest lat]
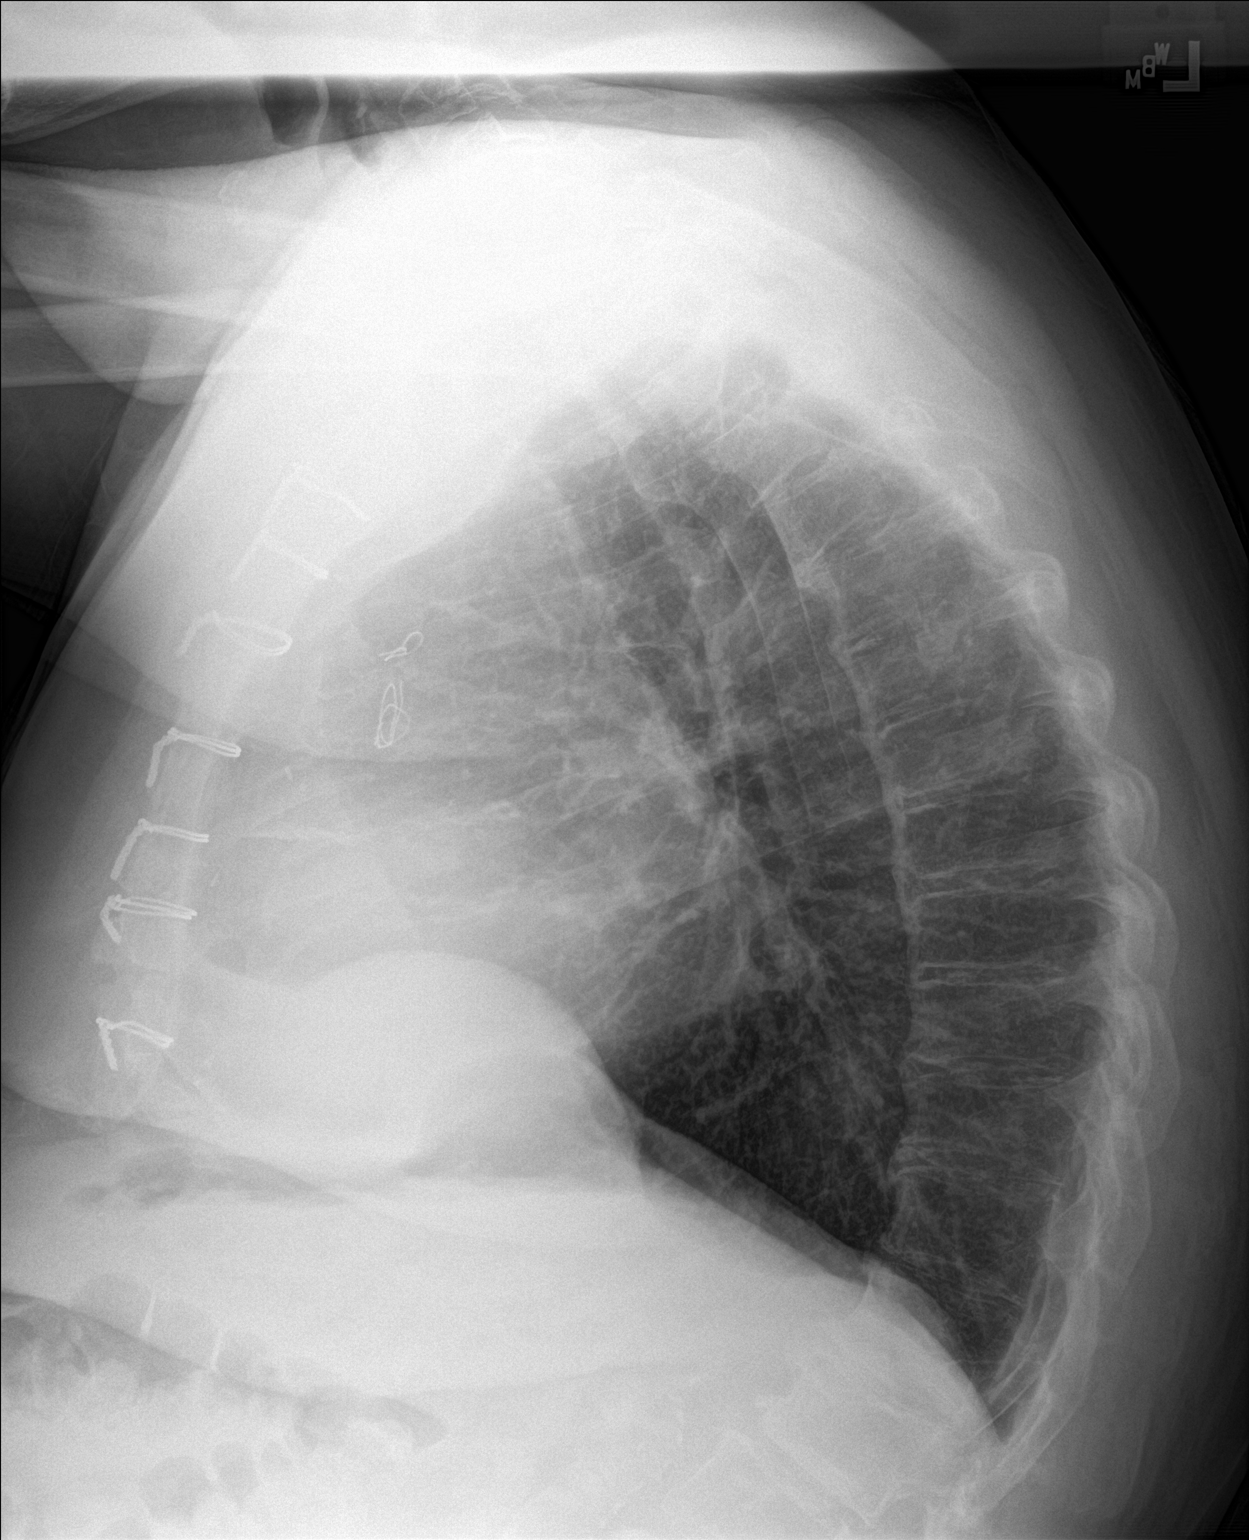

[chest pa (2 of 2)]
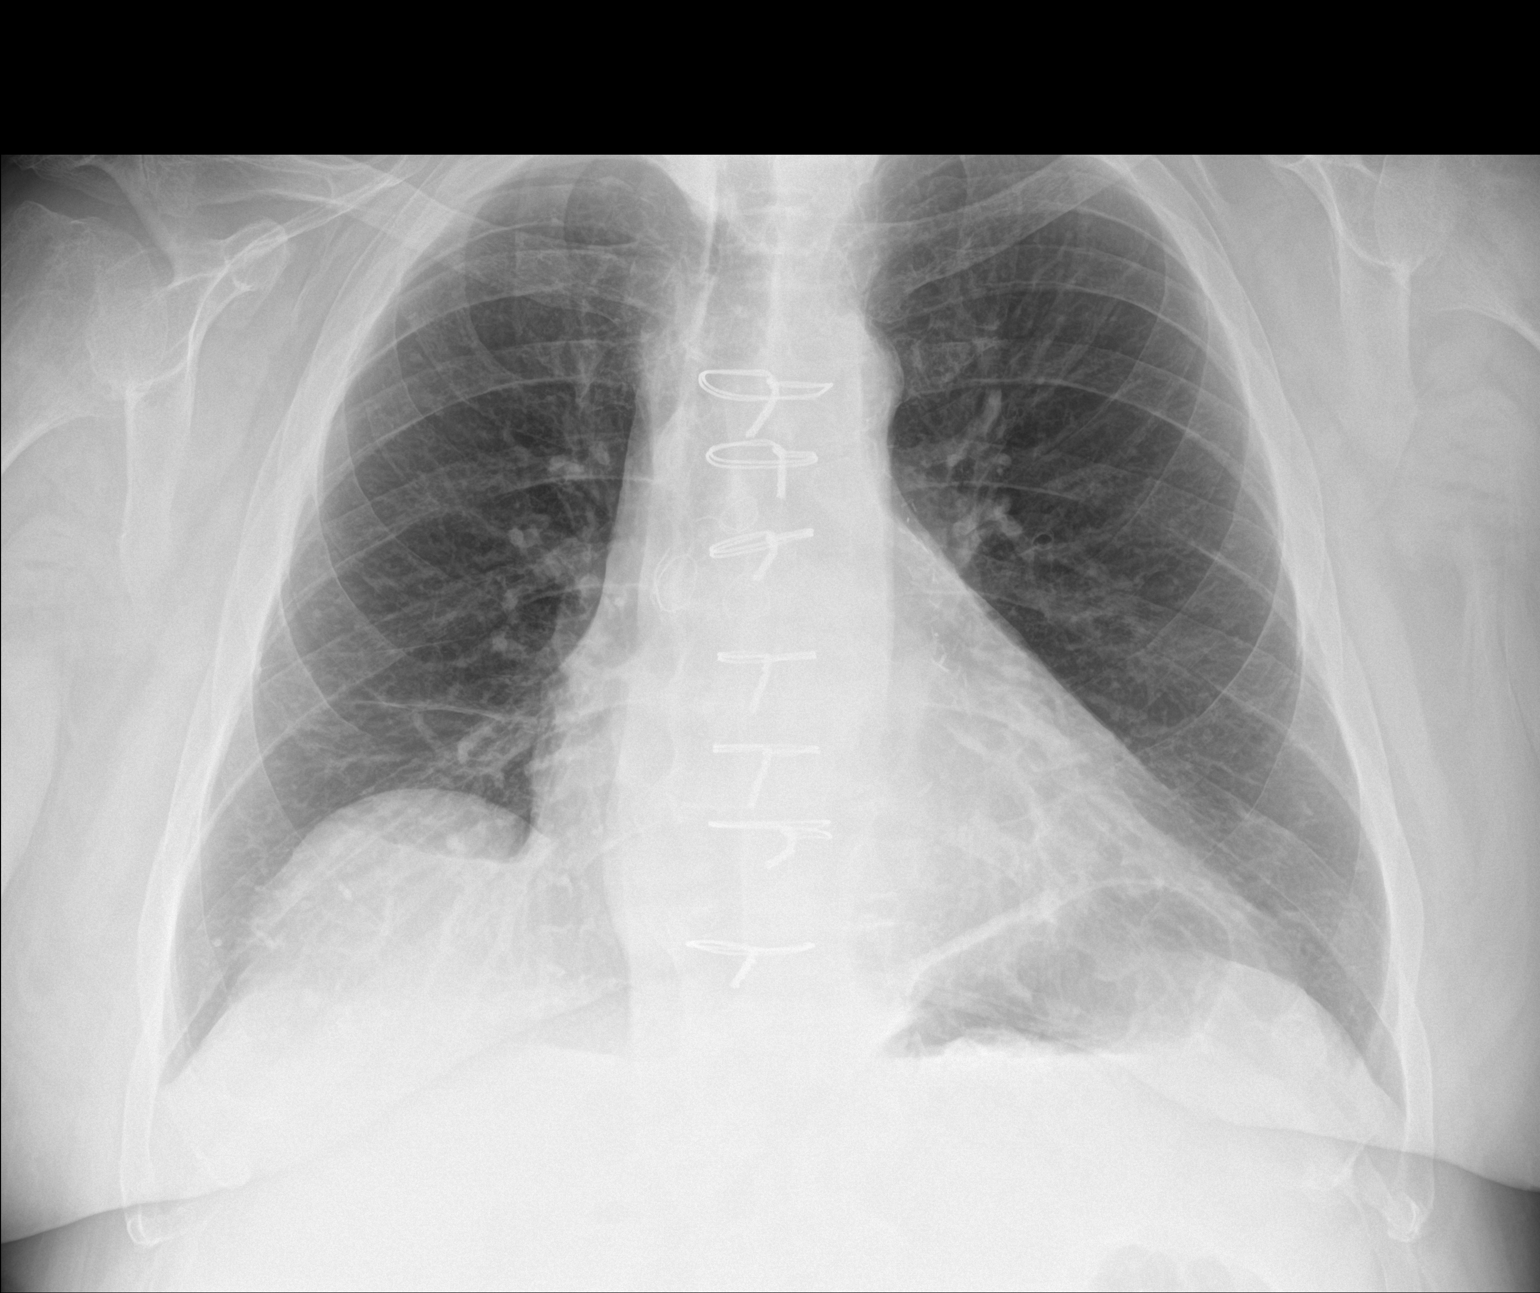

[3 of 3 positions shown; findings below may reference images not displayed]

FINDINGS: Transverse diameter of heart is increased. There is previous
coronary bypass surgery. Lung fields are clear of any infiltrates or
pulmonary edema. There is no pleural effusion or pneumothorax.
IMPRESSION: No active cardiopulmonary disease.

## 2021-12-29 NOTE — Telephone Encounter (Signed)
Social worker called to see if the fax had been sent by the office to [facility], and they have not received it.  Need pt x-rays. Evan [name] stated needs documentation by 5 o'clock to place him.    Fax-[phone]    Call back - [phone]

## 2021-12-29 NOTE — Telephone Encounter (Signed)
Called to let Evan [name] know that I have sent the xray results. While on the call she let me know that the [facility] form did not have his updated medication list. I have faxed this to them as well.

## 2021-12-29 NOTE — Telephone Encounter (Signed)
Information has been faxed  

## 2022-01-04 ENCOUNTER — Telehealth: Payer: Medicare Other

## 2022-01-04 ENCOUNTER — Other Ambulatory Visit: Payer: Self-pay | Admitting: Nurse Practitioner

## 2022-01-04 DIAGNOSIS — E1169 Type 2 diabetes mellitus with other specified complication: Secondary | ICD-10-CM

## 2022-01-04 DIAGNOSIS — Z111 Encounter for screening for respiratory tuberculosis: Secondary | ICD-10-CM

## 2022-01-04 DIAGNOSIS — I152 Hypertension secondary to endocrine disorders: Secondary | ICD-10-CM

## 2022-01-04 DIAGNOSIS — E1159 Type 2 diabetes mellitus with other circulatory complications: Secondary | ICD-10-CM

## 2022-01-04 LAB — QUANTIFERON-TB GOLD PLUS

## 2022-01-04 NOTE — Progress Notes (Signed)
Looks like Evan [name] will need repeat labs for Tb testing, please alert his facility and schedule lab visit for this.

## 2022-01-05 ENCOUNTER — Telehealth: Payer: Self-pay | Admitting: Primary Care

## 2022-01-05 ENCOUNTER — Telehealth: Payer: Self-pay | Admitting: Licensed Clinical Social Worker

## 2022-01-05 NOTE — Telephone Encounter (Signed)
Tried to reach patient for appt. Older number no longer where he is living. Another number for [facility] home is not connected. His own number goes to voice mail. Will continue to try to reach him.

## 2022-01-05 NOTE — Telephone Encounter (Signed)
    Clinical Social Work  Care Management   Phone Outreach    01/05/2022 Name: Evan [name] MRN: [id number] DOB: 1956/03/25  Evan [name] is a 66 y.o. year old male who is a primary care patient of [name], [name], NP .   Reason for referral: Mental Health Counseling and Resources.    F/U phone call made to [name], Evan [name], today to assess needs, progress and barriers with care plan goals.   Telephone outreach was unsuccessful. A HIPPA compliant phone message was left for the patient providing contact information and requesting a return call.   Plan:CCM LCSW will wait for return call.  Review of patient status, including review of consultants reports, relevant laboratory and other test results, and collaboration with appropriate care team members and the patient's provider was performed as part of comprehensive patient evaluation and provision of care management services.    [name], MSW, [email or web address] Phone [phone] 6:57 AM

## 2022-01-09 DIAGNOSIS — I5022 Chronic systolic (congestive) heart failure: Secondary | ICD-10-CM | POA: Diagnosis not present

## 2022-01-09 DIAGNOSIS — J449 Chronic obstructive pulmonary disease, unspecified: Secondary | ICD-10-CM | POA: Diagnosis not present

## 2022-01-09 DIAGNOSIS — I2510 Atherosclerotic heart disease of native coronary artery without angina pectoris: Secondary | ICD-10-CM | POA: Diagnosis not present

## 2022-01-09 DIAGNOSIS — K5901 Slow transit constipation: Secondary | ICD-10-CM | POA: Diagnosis not present

## 2022-01-09 DIAGNOSIS — Z8673 Personal history of transient ischemic attack (TIA), and cerebral infarction without residual deficits: Secondary | ICD-10-CM | POA: Diagnosis not present

## 2022-01-09 DIAGNOSIS — E119 Type 2 diabetes mellitus without complications: Secondary | ICD-10-CM | POA: Diagnosis not present

## 2022-01-12 DIAGNOSIS — E119 Type 2 diabetes mellitus without complications: Secondary | ICD-10-CM | POA: Diagnosis not present

## 2022-01-12 DIAGNOSIS — I5022 Chronic systolic (congestive) heart failure: Secondary | ICD-10-CM | POA: Diagnosis not present

## 2022-01-24 DIAGNOSIS — S80811A Abrasion, right lower leg, initial encounter: Secondary | ICD-10-CM | POA: Diagnosis not present

## 2022-01-24 DIAGNOSIS — R600 Localized edema: Secondary | ICD-10-CM | POA: Diagnosis not present

## 2022-01-24 DIAGNOSIS — E119 Type 2 diabetes mellitus without complications: Secondary | ICD-10-CM | POA: Diagnosis not present

## 2022-01-24 DIAGNOSIS — J449 Chronic obstructive pulmonary disease, unspecified: Secondary | ICD-10-CM | POA: Diagnosis not present

## 2022-01-26 ENCOUNTER — Encounter: Payer: Self-pay | Admitting: Podiatry

## 2022-01-26 ENCOUNTER — Ambulatory Visit (INDEPENDENT_AMBULATORY_CARE_PROVIDER_SITE_OTHER): Payer: Medicare Other | Admitting: Podiatry

## 2022-01-26 DIAGNOSIS — B351 Tinea unguium: Secondary | ICD-10-CM

## 2022-01-26 DIAGNOSIS — M79674 Pain in right toe(s): Secondary | ICD-10-CM

## 2022-01-26 DIAGNOSIS — E1129 Type 2 diabetes mellitus with other diabetic kidney complication: Secondary | ICD-10-CM

## 2022-01-26 DIAGNOSIS — M79675 Pain in left toe(s): Secondary | ICD-10-CM | POA: Diagnosis not present

## 2022-01-26 DIAGNOSIS — R809 Proteinuria, unspecified: Secondary | ICD-10-CM | POA: Diagnosis not present

## 2022-01-26 NOTE — Progress Notes (Signed)
This patient returns to my office for at risk foot care.  This patient requires this care by a professional since this patient will be at risk due to having diabetes and venous stasis. This patient is unable to cut nails himself since the patient cannot reach his nails.These nails are painful walking and wearing shoes.  This patient presents for at risk foot care today.  General Appearance  Alert, conversant and in no acute stress.  Vascular  Dorsalis pedis and posterior tibial  pulses are palpable  bilaterally.  Capillary return is within normal limits  bilaterally. Temperature is within normal limits  bilaterally.  Neurologic  Senn-Weinstein monofilament wire test within normal limits  bilaterally. Muscle power within normal limits bilaterally.  Nails Thick disfigured discolored nails with subungual debris  from hallux to fifth toes bilaterally. No evidence of bacterial infection or drainage bilaterally.  Orthopedic  No limitations of motion  feet .  No crepitus or effusions noted.  No bony pathology or digital deformities noted.  Skin  normotropic skin with no porokeratosis noted bilaterally.  No signs of infections or ulcers noted.     Onychomycosis  Pain in right toes  Pain in left toes  Consent was obtained for treatment procedures.   Mechanical debridement of nails 1-5  bilaterally performed with a nail nipper.  Filed with dremel without incident. Patient says the nails were long so he cut the nails himself.  Told him to RTC 3 months and I will do his nail care.   Return office visit  3 months                   Told patient to return for periodic foot care and evaluation due to potential at risk complications.   Gardiner Barefoot DPM

## 2022-01-28 ENCOUNTER — Encounter: Payer: Self-pay | Admitting: Emergency Medicine

## 2022-01-28 ENCOUNTER — Emergency Department
Admission: EM | Admit: 2022-01-28 | Discharge: 2022-01-29 | Disposition: A | Discharge status: Skilled Nursing Facility | Payer: Medicare Other | Attending: Emergency Medicine | Admitting: Emergency Medicine

## 2022-01-28 DIAGNOSIS — K8020 Calculus of gallbladder without cholecystitis without obstruction: Secondary | ICD-10-CM | POA: Insufficient documentation

## 2022-01-28 DIAGNOSIS — E876 Hypokalemia: Secondary | ICD-10-CM | POA: Insufficient documentation

## 2022-01-28 DIAGNOSIS — L03115 Cellulitis of right lower limb: Secondary | ICD-10-CM

## 2022-01-28 DIAGNOSIS — R1032 Left lower quadrant pain: Secondary | ICD-10-CM | POA: Diagnosis not present

## 2022-01-28 DIAGNOSIS — Z7901 Long term (current) use of anticoagulants: Secondary | ICD-10-CM | POA: Diagnosis not present

## 2022-01-28 DIAGNOSIS — M79604 Pain in right leg: Secondary | ICD-10-CM | POA: Diagnosis not present

## 2022-01-28 DIAGNOSIS — K429 Umbilical hernia without obstruction or gangrene: Secondary | ICD-10-CM

## 2022-01-28 DIAGNOSIS — R109 Unspecified abdominal pain: Secondary | ICD-10-CM | POA: Diagnosis not present

## 2022-01-28 DIAGNOSIS — I700 Atherosclerosis of aorta: Secondary | ICD-10-CM | POA: Diagnosis not present

## 2022-01-28 DIAGNOSIS — Z743 Need for continuous supervision: Secondary | ICD-10-CM | POA: Diagnosis not present

## 2022-01-28 LAB — COMPREHENSIVE METABOLIC PANEL
ALT: 16 U/L (ref 0–44)
AST: 21 U/L (ref 15–41)
Albumin: 3.9 g/dL (ref 3.5–5.0)
Alkaline Phosphatase: 86 U/L (ref 38–126)
Anion gap: 10 (ref 5–15)
BUN: 19 mg/dL (ref 8–23)
CO2: 24 mmol/L (ref 22–32)
Calcium: 9.2 mg/dL (ref 8.9–10.3)
Chloride: 105 mmol/L (ref 98–111)
Creatinine, Ser: 0.98 mg/dL (ref 0.61–1.24)
GFR, Estimated: >60 mL/min (ref >60)
Glucose, Bld: 131 mg/dL — ABNORMAL HIGH (ref 70–99)
Potassium: 3.2 mmol/L — ABNORMAL LOW (ref 3.5–5.1)
Sodium: 139 mmol/L (ref 135–145)
Total Bilirubin: 0.7 mg/dL (ref 0.3–1.2)
Total Protein: 7.4 g/dL (ref 6.5–8.1)

## 2022-01-28 LAB — URINALYSIS, ROUTINE W REFLEX MICROSCOPIC
Bacteria, UA: NONE SEEN
Bilirubin Urine: NEGATIVE
Glucose, UA: >=500 mg/dL — AB
Ketones, ur: NEGATIVE mg/dL
Leukocytes,Ua: NEGATIVE
Nitrite: NEGATIVE
Protein, ur: NEGATIVE mg/dL
Specific Gravity, Urine: 1.011 (ref 1.005–1.030)
Squamous Epithelial / HPF: NONE SEEN (ref 0–5)
pH: 5 (ref 5.0–8.0)

## 2022-01-28 LAB — CBC
HCT: 41.9 % (ref 39.0–52.0)
Hemoglobin: 13.6 g/dL (ref 13.0–17.0)
MCH: 29.4 pg (ref 26.0–34.0)
MCHC: 32.5 g/dL (ref 30.0–36.0)
MCV: 90.5 fL (ref 80.0–100.0)
Platelets: 250 10*3/uL (ref 150–400)
RBC: 4.63 MIL/uL (ref 4.22–5.81)
RDW: 14.6 % (ref 11.5–15.5)
WBC: 8.8 10*3/uL (ref 4.0–10.5)
nRBC: 0 % (ref 0.0–0.2)

## 2022-01-28 LAB — LIPASE, BLOOD: Lipase: 37 U/L (ref 11–51)
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
## 2022-01-29 ENCOUNTER — Emergency Department: Payer: Medicare Other

## 2022-01-29 DIAGNOSIS — R279 Unspecified lack of coordination: Secondary | ICD-10-CM | POA: Diagnosis not present

## 2022-01-29 DIAGNOSIS — Z743 Need for continuous supervision: Secondary | ICD-10-CM | POA: Diagnosis not present

## 2022-01-29 DIAGNOSIS — K8020 Calculus of gallbladder without cholecystitis without obstruction: Secondary | ICD-10-CM | POA: Diagnosis not present

## 2022-01-29 DIAGNOSIS — I700 Atherosclerosis of aorta: Secondary | ICD-10-CM | POA: Diagnosis not present

## 2022-01-29 DIAGNOSIS — R1032 Left lower quadrant pain: Secondary | ICD-10-CM | POA: Diagnosis not present

## 2022-01-29 MED ORDER — POLYETHYLENE GLYCOL 3350 17 G PO PACK: 17.0000 g | PACK | Freq: Every day | ORAL | 0 refills | Status: AC | PRN

## 2022-01-29 MED ORDER — CLINDAMYCIN HCL 300 MG PO CAPS: 300.0000 mg | ORAL_CAPSULE | Freq: Three times a day (TID) | ORAL | 0 refills | Status: AC

## 2022-01-29 MED ORDER — IOHEXOL 350 MG/ML SOLN
100.0000 mL | Freq: Once | INTRAVENOUS | Status: AC | PRN
  Administered 2022-01-29: 100 mL via INTRAVENOUS

## 2022-01-30 DIAGNOSIS — K5901 Slow transit constipation: Secondary | ICD-10-CM | POA: Diagnosis not present

## 2022-01-30 DIAGNOSIS — I739 Peripheral vascular disease, unspecified: Secondary | ICD-10-CM | POA: Diagnosis not present

## 2022-01-30 DIAGNOSIS — R451 Restlessness and agitation: Secondary | ICD-10-CM | POA: Diagnosis not present

## 2022-01-30 DIAGNOSIS — R1084 Generalized abdominal pain: Secondary | ICD-10-CM | POA: Diagnosis not present

## 2022-02-01 ENCOUNTER — Telehealth: Payer: Self-pay | Admitting: *Deleted

## 2022-02-01 NOTE — Telephone Encounter (Signed)
Transition Care Management Unsuccessful Follow-up Telephone Call  Date of discharge and from where:  [facility] 01-29-2022  Attempts:  1st Attempt  Reason for unsuccessful TCM follow-up call:  Unable to leave message  mailbox full

## 2022-02-02 NOTE — Telephone Encounter (Signed)
Transition Care Management Unsuccessful Follow-up Telephone Call  Date of discharge and from where:  [facility] 01-29-2022  Attempts:  2nd Attempt  Reason for unsuccessful TCM follow-up call:  Unable to reach patient

## 2022-02-03 ENCOUNTER — Other Ambulatory Visit: Payer: Self-pay | Admitting: *Deleted

## 2022-02-03 DIAGNOSIS — G9520 Unspecified cord compression: Secondary | ICD-10-CM | POA: Diagnosis not present

## 2022-02-06 DIAGNOSIS — R3589 Other polyuria: Secondary | ICD-10-CM | POA: Diagnosis not present

## 2022-02-06 DIAGNOSIS — E782 Mixed hyperlipidemia: Secondary | ICD-10-CM | POA: Diagnosis not present

## 2022-02-06 DIAGNOSIS — E1159 Type 2 diabetes mellitus with other circulatory complications: Secondary | ICD-10-CM | POA: Diagnosis not present

## 2022-02-06 DIAGNOSIS — S80811D Abrasion, right lower leg, subsequent encounter: Secondary | ICD-10-CM | POA: Diagnosis not present

## 2022-02-06 DIAGNOSIS — I4891 Unspecified atrial fibrillation: Secondary | ICD-10-CM | POA: Diagnosis not present

## 2022-02-06 DIAGNOSIS — R0789 Other chest pain: Secondary | ICD-10-CM | POA: Diagnosis not present

## 2022-02-06 DIAGNOSIS — R9431 Abnormal electrocardiogram [ECG] [EKG]: Secondary | ICD-10-CM | POA: Diagnosis not present

## 2022-02-06 DIAGNOSIS — K5901 Slow transit constipation: Secondary | ICD-10-CM | POA: Diagnosis not present

## 2022-02-09 ENCOUNTER — Encounter: Payer: Self-pay | Admitting: Nurse Practitioner

## 2022-02-09 ENCOUNTER — Encounter: Payer: Medicare Other | Admitting: Nurse Practitioner

## 2022-02-09 DIAGNOSIS — I4819 Other persistent atrial fibrillation: Secondary | ICD-10-CM

## 2022-02-09 DIAGNOSIS — Z8546 Personal history of malignant neoplasm of prostate: Secondary | ICD-10-CM

## 2022-02-09 DIAGNOSIS — I25118 Atherosclerotic heart disease of native coronary artery with other forms of angina pectoris: Secondary | ICD-10-CM

## 2022-02-09 DIAGNOSIS — I700 Atherosclerosis of aorta: Secondary | ICD-10-CM

## 2022-02-09 DIAGNOSIS — E559 Vitamin D deficiency, unspecified: Secondary | ICD-10-CM

## 2022-02-09 DIAGNOSIS — M810 Age-related osteoporosis without current pathological fracture: Secondary | ICD-10-CM

## 2022-02-09 DIAGNOSIS — I5032 Chronic diastolic (congestive) heart failure: Secondary | ICD-10-CM

## 2022-02-09 DIAGNOSIS — Z9989 Dependence on other enabling machines and devices: Secondary | ICD-10-CM

## 2022-02-09 DIAGNOSIS — J449 Chronic obstructive pulmonary disease, unspecified: Secondary | ICD-10-CM

## 2022-02-09 DIAGNOSIS — E538 Deficiency of other specified B group vitamins: Secondary | ICD-10-CM

## 2022-02-09 DIAGNOSIS — E113293 Type 2 diabetes mellitus with mild nonproliferative diabetic retinopathy without macular edema, bilateral: Secondary | ICD-10-CM

## 2022-02-09 DIAGNOSIS — F200 Paranoid schizophrenia: Secondary | ICD-10-CM

## 2022-02-09 DIAGNOSIS — Z0000 Encounter for general adult medical examination without abnormal findings: Secondary | ICD-10-CM

## 2022-02-09 DIAGNOSIS — E1159 Type 2 diabetes mellitus with other circulatory complications: Secondary | ICD-10-CM

## 2022-02-09 DIAGNOSIS — D6869 Other thrombophilia: Secondary | ICD-10-CM

## 2022-02-09 DIAGNOSIS — E1169 Type 2 diabetes mellitus with other specified complication: Secondary | ICD-10-CM

## 2022-02-10 ENCOUNTER — Inpatient Hospital Stay: Payer: Medicare Other | Attending: Radiation Oncology

## 2022-02-10 ENCOUNTER — Telehealth: Payer: Self-pay | Admitting: Primary Care

## 2022-02-10 DIAGNOSIS — Z8546 Personal history of malignant neoplasm of prostate: Secondary | ICD-10-CM | POA: Diagnosis present

## 2022-02-10 DIAGNOSIS — Z923 Personal history of irradiation: Secondary | ICD-10-CM | POA: Diagnosis not present

## 2022-02-10 DIAGNOSIS — C61 Malignant neoplasm of prostate: Secondary | ICD-10-CM

## 2022-02-10 LAB — PSA: Prostatic Specific Antigen: 0.01 ng/mL (ref 0.00–4.00)

## 2022-02-10 NOTE — Telephone Encounter (Signed)
Patient where abouts unknown, care home he went to does not have a working number. I called pt and went to voice mail, no way to leave a message.

## 2022-02-13 DIAGNOSIS — R269 Unspecified abnormalities of gait and mobility: Secondary | ICD-10-CM | POA: Diagnosis not present

## 2022-02-13 DIAGNOSIS — W010XXA Fall on same level from slipping, tripping and stumbling without subsequent striking against object, initial encounter: Secondary | ICD-10-CM | POA: Diagnosis not present

## 2022-02-13 DIAGNOSIS — K5901 Slow transit constipation: Secondary | ICD-10-CM | POA: Diagnosis not present

## 2022-02-17 ENCOUNTER — Encounter: Payer: Self-pay | Admitting: Radiation Oncology

## 2022-02-17 ENCOUNTER — Ambulatory Visit
Admission: RE | Admit: 2022-02-17 | Discharge: 2022-02-17 | Disposition: A | Discharge status: Home / Self Care | Payer: Medicare Other | Source: Ambulatory Visit | Attending: Radiation Oncology | Admitting: Radiation Oncology

## 2022-02-17 VITALS — BP 113/78 | HR 87 | Temp 98.6°F | Resp 20 | Wt 202.4 lb

## 2022-02-17 DIAGNOSIS — Z923 Personal history of irradiation: Secondary | ICD-10-CM | POA: Insufficient documentation

## 2022-02-17 DIAGNOSIS — Z08 Encounter for follow-up examination after completed treatment for malignant neoplasm: Secondary | ICD-10-CM | POA: Diagnosis not present

## 2022-02-17 DIAGNOSIS — C61 Malignant neoplasm of prostate: Secondary | ICD-10-CM | POA: Diagnosis present

## 2022-02-17 NOTE — Progress Notes (Signed)
Radiation Oncology Follow up Note  Name: Evan [name]   Date:   02/17/2022 MRN:  [id number] DOB: [birth year]    This 66 y.o. male presents to the clinic today for over 4-year follow-up status post IMRT radiation therapy to his prostate and pelvic nodes for Gleason 8 (4+4) adenocarcinoma presenting with a PSA of 30.  REFERRING PROVIDER: [name], NP  HPI: Patient is a 66 year old male now out over 4 years having completed IMRT radiation therapy to his prostate and pelvic nodes for Gleason 8 adenocarcinoma the prostate presenting with a PSA of 30.  He is seen today in follow-up doing well he tends towards constipation is having no increased lower urinary tract symptoms.  He is no longer on ADT therapy.  His most recent PSA remains undetectable at less than 0.01.Marland Kitchen  He had a recent CT scan of his abdomen pelvis for abdominal pain showing no acute intra-abdominal pathology.  COMPLICATIONS OF TREATMENT: none  FOLLOW UP COMPLIANCE: keeps appointments   PHYSICAL EXAM:  BP 113/78 (BP Location: Right Arm, Patient Position: Sitting, Cuff Size: Normal)   Pulse 87   Temp 98.6 F (37 C) (Tympanic)   Resp 20   Wt 202 lb 6.4 oz (91.8 kg)   BMI 33.68 kg/m  Well-developed well-nourished patient in NAD. HEENT reveals PERLA, EOMI, discs not visualized.  Oral cavity is clear. No oral mucosal lesions are identified. Neck is clear without evidence of cervical or supraclavicular adenopathy. Lungs are clear to A&P. Cardiac examination is essentially unremarkable with regular rate and rhythm without murmur rub or thrill. Abdomen is benign with no organomegaly or masses noted. Motor sensory and DTR levels are equal and symmetric in the upper and lower extremities. Cranial nerves II through XII are grossly intact. Proprioception is intact. No peripheral adenopathy or edema is identified. No motor or sensory levels are noted. Crude visual fields are within normal range.  RADIOLOGY RESULTS: CT scan of  abdomen and pelvis reviewed  PLAN: Present time patient continues to do well now out over 4 years with undetectable PSA.  I am going to turn follow-up care over to his primary caregivers.  I would be happy to reevaluate the patient anytime should that be indicated.  Patient knows to call with any concerns.  I would like to take this opportunity to thank you for allowing me to participate in the care of your patient.Noreene Filbert, MD

## 2022-02-20 DIAGNOSIS — J449 Chronic obstructive pulmonary disease, unspecified: Secondary | ICD-10-CM | POA: Diagnosis not present

## 2022-02-20 DIAGNOSIS — K625 Hemorrhage of anus and rectum: Secondary | ICD-10-CM | POA: Diagnosis not present

## 2022-02-20 DIAGNOSIS — K5901 Slow transit constipation: Secondary | ICD-10-CM | POA: Diagnosis not present

## 2022-02-27 ENCOUNTER — Ambulatory Visit: Payer: Medicaid Other

## 2022-02-27 ENCOUNTER — Ambulatory Visit: Payer: Self-pay | Admitting: Licensed Clinical Social Worker

## 2022-02-27 NOTE — Chronic Care Management (AMB) (Signed)
[facility]) Care Management  02/27/2022  Evan [name] 1955-10-17 [id number]  Patient was not contacted during encounter. CCM LCSW will discharge pt from CCM care plan due to no required f/up.  [name], MSW, [email or web address] Phone [phone] 3:48 PM

## 2022-03-03 DIAGNOSIS — I130 Hypertensive heart and chronic kidney disease with heart failure and stage 1 through stage 4 chronic kidney disease, or unspecified chronic kidney disease: Secondary | ICD-10-CM | POA: Diagnosis not present

## 2022-03-03 DIAGNOSIS — J4520 Mild intermittent asthma, uncomplicated: Secondary | ICD-10-CM | POA: Diagnosis not present

## 2022-03-06 DIAGNOSIS — E782 Mixed hyperlipidemia: Secondary | ICD-10-CM | POA: Diagnosis not present

## 2022-03-06 DIAGNOSIS — K5901 Slow transit constipation: Secondary | ICD-10-CM | POA: Diagnosis not present

## 2022-03-06 DIAGNOSIS — J449 Chronic obstructive pulmonary disease, unspecified: Secondary | ICD-10-CM | POA: Diagnosis not present

## 2022-03-06 DIAGNOSIS — Z6379 Other stressful life events affecting family and household: Secondary | ICD-10-CM | POA: Diagnosis not present

## 2022-03-13 DIAGNOSIS — G4733 Obstructive sleep apnea (adult) (pediatric): Secondary | ICD-10-CM | POA: Diagnosis not present

## 2022-03-13 DIAGNOSIS — I872 Venous insufficiency (chronic) (peripheral): Secondary | ICD-10-CM | POA: Diagnosis not present

## 2022-03-13 DIAGNOSIS — I2510 Atherosclerotic heart disease of native coronary artery without angina pectoris: Secondary | ICD-10-CM | POA: Diagnosis not present

## 2022-03-13 DIAGNOSIS — E1165 Type 2 diabetes mellitus with hyperglycemia: Secondary | ICD-10-CM | POA: Diagnosis not present

## 2022-03-13 DIAGNOSIS — I5032 Chronic diastolic (congestive) heart failure: Secondary | ICD-10-CM | POA: Diagnosis not present

## 2022-03-13 DIAGNOSIS — Z9989 Dependence on other enabling machines and devices: Secondary | ICD-10-CM | POA: Diagnosis not present

## 2022-03-13 DIAGNOSIS — I10 Essential (primary) hypertension: Secondary | ICD-10-CM | POA: Diagnosis not present

## 2022-03-13 DIAGNOSIS — E7800 Pure hypercholesterolemia, unspecified: Secondary | ICD-10-CM | POA: Diagnosis not present

## 2022-04-03 DIAGNOSIS — I739 Peripheral vascular disease, unspecified: Secondary | ICD-10-CM | POA: Diagnosis not present

## 2022-04-03 DIAGNOSIS — I5022 Chronic systolic (congestive) heart failure: Secondary | ICD-10-CM | POA: Diagnosis not present

## 2022-04-03 DIAGNOSIS — R040 Epistaxis: Secondary | ICD-10-CM | POA: Diagnosis not present

## 2022-04-03 DIAGNOSIS — N182 Chronic kidney disease, stage 2 (mild): Secondary | ICD-10-CM | POA: Diagnosis not present

## 2022-04-03 DIAGNOSIS — E782 Mixed hyperlipidemia: Secondary | ICD-10-CM | POA: Diagnosis not present

## 2022-04-03 DIAGNOSIS — I2510 Atherosclerotic heart disease of native coronary artery without angina pectoris: Secondary | ICD-10-CM | POA: Diagnosis not present

## 2022-04-03 DIAGNOSIS — Z7901 Long term (current) use of anticoagulants: Secondary | ICD-10-CM | POA: Diagnosis not present

## 2022-04-03 DIAGNOSIS — R350 Frequency of micturition: Secondary | ICD-10-CM | POA: Diagnosis not present

## 2022-04-03 DIAGNOSIS — J4520 Mild intermittent asthma, uncomplicated: Secondary | ICD-10-CM | POA: Diagnosis not present

## 2022-04-03 DIAGNOSIS — R634 Abnormal weight loss: Secondary | ICD-10-CM | POA: Diagnosis not present

## 2022-04-03 DIAGNOSIS — J449 Chronic obstructive pulmonary disease, unspecified: Secondary | ICD-10-CM | POA: Diagnosis not present

## 2022-04-03 DIAGNOSIS — I130 Hypertensive heart and chronic kidney disease with heart failure and stage 1 through stage 4 chronic kidney disease, or unspecified chronic kidney disease: Secondary | ICD-10-CM | POA: Diagnosis not present

## 2022-04-04 DIAGNOSIS — D692 Other nonthrombocytopenic purpura: Secondary | ICD-10-CM | POA: Diagnosis not present

## 2022-04-04 DIAGNOSIS — I10 Essential (primary) hypertension: Secondary | ICD-10-CM | POA: Diagnosis not present

## 2022-04-13 DIAGNOSIS — E785 Hyperlipidemia, unspecified: Secondary | ICD-10-CM | POA: Diagnosis not present

## 2022-04-13 DIAGNOSIS — Z79899 Other long term (current) drug therapy: Secondary | ICD-10-CM | POA: Diagnosis not present

## 2022-04-17 ENCOUNTER — Telehealth: Payer: Self-pay | Admitting: Nurse Practitioner

## 2022-04-17 NOTE — Telephone Encounter (Signed)
Copied from [place] [id number]. Topic: Medicare AWV >> Apr 17, 2022 10:01 AM Josephina Gip wrote: Reason for CRM: Left message for patient to call back and schedule Medicare Annual Wellness Visit (AWV) to be done virtually or by telephone.  No hx of AWV eligible as of 02/25/21  Please schedule at anytime with CFP-Nurse Health Advisor.        Any questions, please call me at [phone]

## 2022-04-24 DIAGNOSIS — R21 Rash and other nonspecific skin eruption: Secondary | ICD-10-CM | POA: Diagnosis not present

## 2022-04-24 DIAGNOSIS — D692 Other nonthrombocytopenic purpura: Secondary | ICD-10-CM | POA: Diagnosis not present

## 2022-04-24 DIAGNOSIS — L299 Pruritus, unspecified: Secondary | ICD-10-CM | POA: Diagnosis not present

## 2022-05-01 ENCOUNTER — Ambulatory Visit: Payer: Medicare Other | Admitting: Podiatry

## 2022-05-01 DIAGNOSIS — I130 Hypertensive heart and chronic kidney disease with heart failure and stage 1 through stage 4 chronic kidney disease, or unspecified chronic kidney disease: Secondary | ICD-10-CM | POA: Diagnosis not present

## 2022-05-01 DIAGNOSIS — E538 Deficiency of other specified B group vitamins: Secondary | ICD-10-CM | POA: Diagnosis not present

## 2022-05-01 DIAGNOSIS — L299 Pruritus, unspecified: Secondary | ICD-10-CM | POA: Diagnosis not present

## 2022-05-01 DIAGNOSIS — R634 Abnormal weight loss: Secondary | ICD-10-CM | POA: Diagnosis not present

## 2022-05-01 DIAGNOSIS — H532 Diplopia: Secondary | ICD-10-CM | POA: Diagnosis not present

## 2022-05-01 DIAGNOSIS — N182 Chronic kidney disease, stage 2 (mild): Secondary | ICD-10-CM | POA: Diagnosis not present

## 2022-05-01 DIAGNOSIS — Q845 Enlarged and hypertrophic nails: Secondary | ICD-10-CM | POA: Diagnosis not present

## 2022-05-01 DIAGNOSIS — R21 Rash and other nonspecific skin eruption: Secondary | ICD-10-CM | POA: Diagnosis not present

## 2022-05-01 DIAGNOSIS — E1122 Type 2 diabetes mellitus with diabetic chronic kidney disease: Secondary | ICD-10-CM | POA: Diagnosis not present

## 2022-05-01 DIAGNOSIS — D692 Other nonthrombocytopenic purpura: Secondary | ICD-10-CM | POA: Diagnosis not present

## 2022-05-03 ENCOUNTER — Encounter: Payer: Self-pay | Admitting: Cardiovascular Disease

## 2022-05-04 DIAGNOSIS — D692 Other nonthrombocytopenic purpura: Secondary | ICD-10-CM | POA: Diagnosis not present

## 2022-05-04 DIAGNOSIS — E1122 Type 2 diabetes mellitus with diabetic chronic kidney disease: Secondary | ICD-10-CM | POA: Diagnosis not present

## 2022-05-15 DIAGNOSIS — R21 Rash and other nonspecific skin eruption: Secondary | ICD-10-CM | POA: Diagnosis not present

## 2022-05-15 DIAGNOSIS — Q845 Enlarged and hypertrophic nails: Secondary | ICD-10-CM | POA: Diagnosis not present

## 2022-05-15 DIAGNOSIS — L299 Pruritus, unspecified: Secondary | ICD-10-CM | POA: Diagnosis not present

## 2022-05-29 DIAGNOSIS — K469 Unspecified abdominal hernia without obstruction or gangrene: Secondary | ICD-10-CM | POA: Diagnosis not present

## 2022-05-29 DIAGNOSIS — R21 Rash and other nonspecific skin eruption: Secondary | ICD-10-CM | POA: Diagnosis not present

## 2022-05-29 DIAGNOSIS — R350 Frequency of micturition: Secondary | ICD-10-CM | POA: Diagnosis not present

## 2022-05-29 DIAGNOSIS — R269 Unspecified abnormalities of gait and mobility: Secondary | ICD-10-CM | POA: Diagnosis not present

## 2022-05-29 DIAGNOSIS — K5901 Slow transit constipation: Secondary | ICD-10-CM | POA: Diagnosis not present

## 2022-05-31 DIAGNOSIS — L0100 Impetigo, unspecified: Secondary | ICD-10-CM | POA: Diagnosis not present

## 2022-05-31 DIAGNOSIS — K439 Ventral hernia without obstruction or gangrene: Secondary | ICD-10-CM | POA: Diagnosis not present

## 2022-05-31 DIAGNOSIS — H532 Diplopia: Secondary | ICD-10-CM | POA: Diagnosis not present

## 2022-05-31 DIAGNOSIS — R21 Rash and other nonspecific skin eruption: Secondary | ICD-10-CM | POA: Diagnosis not present

## 2022-05-31 DIAGNOSIS — G4733 Obstructive sleep apnea (adult) (pediatric): Secondary | ICD-10-CM | POA: Diagnosis not present

## 2022-05-31 DIAGNOSIS — D692 Other nonthrombocytopenic purpura: Secondary | ICD-10-CM | POA: Diagnosis not present

## 2022-05-31 DIAGNOSIS — I130 Hypertensive heart and chronic kidney disease with heart failure and stage 1 through stage 4 chronic kidney disease, or unspecified chronic kidney disease: Secondary | ICD-10-CM | POA: Diagnosis not present

## 2022-05-31 DIAGNOSIS — I872 Venous insufficiency (chronic) (peripheral): Secondary | ICD-10-CM | POA: Diagnosis not present

## 2022-05-31 DIAGNOSIS — E1122 Type 2 diabetes mellitus with diabetic chronic kidney disease: Secondary | ICD-10-CM | POA: Diagnosis not present

## 2022-05-31 DIAGNOSIS — R350 Frequency of micturition: Secondary | ICD-10-CM | POA: Diagnosis not present

## 2022-05-31 DIAGNOSIS — G9529 Other cord compression: Secondary | ICD-10-CM | POA: Diagnosis not present

## 2022-05-31 DIAGNOSIS — J45909 Unspecified asthma, uncomplicated: Secondary | ICD-10-CM | POA: Diagnosis not present

## 2022-05-31 DIAGNOSIS — I5032 Chronic diastolic (congestive) heart failure: Secondary | ICD-10-CM | POA: Diagnosis not present

## 2022-05-31 DIAGNOSIS — I4891 Unspecified atrial fibrillation: Secondary | ICD-10-CM | POA: Diagnosis not present

## 2022-05-31 DIAGNOSIS — R634 Abnormal weight loss: Secondary | ICD-10-CM | POA: Diagnosis not present

## 2022-05-31 DIAGNOSIS — E1151 Type 2 diabetes mellitus with diabetic peripheral angiopathy without gangrene: Secondary | ICD-10-CM | POA: Diagnosis not present

## 2022-05-31 DIAGNOSIS — Q845 Enlarged and hypertrophic nails: Secondary | ICD-10-CM | POA: Diagnosis not present

## 2022-05-31 DIAGNOSIS — L299 Pruritus, unspecified: Secondary | ICD-10-CM | POA: Diagnosis not present

## 2022-05-31 DIAGNOSIS — I2510 Atherosclerotic heart disease of native coronary artery without angina pectoris: Secondary | ICD-10-CM | POA: Diagnosis not present

## 2022-05-31 DIAGNOSIS — E785 Hyperlipidemia, unspecified: Secondary | ICD-10-CM | POA: Diagnosis not present

## 2022-05-31 DIAGNOSIS — J449 Chronic obstructive pulmonary disease, unspecified: Secondary | ICD-10-CM | POA: Diagnosis not present

## 2022-05-31 DIAGNOSIS — N182 Chronic kidney disease, stage 2 (mild): Secondary | ICD-10-CM | POA: Diagnosis not present

## 2022-05-31 DIAGNOSIS — G3184 Mild cognitive impairment, so stated: Secondary | ICD-10-CM | POA: Diagnosis not present

## 2022-05-31 DIAGNOSIS — E538 Deficiency of other specified B group vitamins: Secondary | ICD-10-CM | POA: Diagnosis not present

## 2022-06-01 DIAGNOSIS — E1122 Type 2 diabetes mellitus with diabetic chronic kidney disease: Secondary | ICD-10-CM | POA: Diagnosis not present

## 2022-06-05 ENCOUNTER — Encounter (INDEPENDENT_AMBULATORY_CARE_PROVIDER_SITE_OTHER): Payer: Self-pay

## 2022-06-06 DIAGNOSIS — I2510 Atherosclerotic heart disease of native coronary artery without angina pectoris: Secondary | ICD-10-CM | POA: Diagnosis not present

## 2022-06-06 DIAGNOSIS — I872 Venous insufficiency (chronic) (peripheral): Secondary | ICD-10-CM | POA: Diagnosis not present

## 2022-06-06 DIAGNOSIS — G3184 Mild cognitive impairment, so stated: Secondary | ICD-10-CM | POA: Diagnosis not present

## 2022-06-06 DIAGNOSIS — J45909 Unspecified asthma, uncomplicated: Secondary | ICD-10-CM | POA: Diagnosis not present

## 2022-06-06 DIAGNOSIS — L299 Pruritus, unspecified: Secondary | ICD-10-CM | POA: Diagnosis not present

## 2022-06-06 DIAGNOSIS — N182 Chronic kidney disease, stage 2 (mild): Secondary | ICD-10-CM | POA: Diagnosis not present

## 2022-06-06 DIAGNOSIS — I4891 Unspecified atrial fibrillation: Secondary | ICD-10-CM | POA: Diagnosis not present

## 2022-06-06 DIAGNOSIS — L0100 Impetigo, unspecified: Secondary | ICD-10-CM | POA: Diagnosis not present

## 2022-06-06 DIAGNOSIS — R350 Frequency of micturition: Secondary | ICD-10-CM | POA: Diagnosis not present

## 2022-06-06 DIAGNOSIS — K439 Ventral hernia without obstruction or gangrene: Secondary | ICD-10-CM | POA: Diagnosis not present

## 2022-06-06 DIAGNOSIS — E1122 Type 2 diabetes mellitus with diabetic chronic kidney disease: Secondary | ICD-10-CM | POA: Diagnosis not present

## 2022-06-06 DIAGNOSIS — E1151 Type 2 diabetes mellitus with diabetic peripheral angiopathy without gangrene: Secondary | ICD-10-CM | POA: Diagnosis not present

## 2022-06-06 DIAGNOSIS — I130 Hypertensive heart and chronic kidney disease with heart failure and stage 1 through stage 4 chronic kidney disease, or unspecified chronic kidney disease: Secondary | ICD-10-CM | POA: Diagnosis not present

## 2022-06-06 DIAGNOSIS — R634 Abnormal weight loss: Secondary | ICD-10-CM | POA: Diagnosis not present

## 2022-06-06 DIAGNOSIS — D692 Other nonthrombocytopenic purpura: Secondary | ICD-10-CM | POA: Diagnosis not present

## 2022-06-06 DIAGNOSIS — G4733 Obstructive sleep apnea (adult) (pediatric): Secondary | ICD-10-CM | POA: Diagnosis not present

## 2022-06-06 DIAGNOSIS — R21 Rash and other nonspecific skin eruption: Secondary | ICD-10-CM | POA: Diagnosis not present

## 2022-06-06 DIAGNOSIS — I5032 Chronic diastolic (congestive) heart failure: Secondary | ICD-10-CM | POA: Diagnosis not present

## 2022-06-06 DIAGNOSIS — E785 Hyperlipidemia, unspecified: Secondary | ICD-10-CM | POA: Diagnosis not present

## 2022-06-06 DIAGNOSIS — E538 Deficiency of other specified B group vitamins: Secondary | ICD-10-CM | POA: Diagnosis not present

## 2022-06-06 DIAGNOSIS — G9529 Other cord compression: Secondary | ICD-10-CM | POA: Diagnosis not present

## 2022-06-06 DIAGNOSIS — Q845 Enlarged and hypertrophic nails: Secondary | ICD-10-CM | POA: Diagnosis not present

## 2022-06-06 DIAGNOSIS — J449 Chronic obstructive pulmonary disease, unspecified: Secondary | ICD-10-CM | POA: Diagnosis not present

## 2022-06-06 DIAGNOSIS — H532 Diplopia: Secondary | ICD-10-CM | POA: Diagnosis not present

## 2022-06-09 ENCOUNTER — Telehealth: Payer: Self-pay | Admitting: Nurse Practitioner

## 2022-06-09 DIAGNOSIS — E1122 Type 2 diabetes mellitus with diabetic chronic kidney disease: Secondary | ICD-10-CM | POA: Diagnosis not present

## 2022-06-09 DIAGNOSIS — R21 Rash and other nonspecific skin eruption: Secondary | ICD-10-CM | POA: Diagnosis not present

## 2022-06-09 DIAGNOSIS — R634 Abnormal weight loss: Secondary | ICD-10-CM | POA: Diagnosis not present

## 2022-06-09 DIAGNOSIS — N182 Chronic kidney disease, stage 2 (mild): Secondary | ICD-10-CM | POA: Diagnosis not present

## 2022-06-09 DIAGNOSIS — I5032 Chronic diastolic (congestive) heart failure: Secondary | ICD-10-CM | POA: Diagnosis not present

## 2022-06-09 DIAGNOSIS — I130 Hypertensive heart and chronic kidney disease with heart failure and stage 1 through stage 4 chronic kidney disease, or unspecified chronic kidney disease: Secondary | ICD-10-CM | POA: Diagnosis not present

## 2022-06-09 DIAGNOSIS — H532 Diplopia: Secondary | ICD-10-CM | POA: Diagnosis not present

## 2022-06-09 DIAGNOSIS — E538 Deficiency of other specified B group vitamins: Secondary | ICD-10-CM | POA: Diagnosis not present

## 2022-06-09 DIAGNOSIS — G3184 Mild cognitive impairment, so stated: Secondary | ICD-10-CM | POA: Diagnosis not present

## 2022-06-09 DIAGNOSIS — Q845 Enlarged and hypertrophic nails: Secondary | ICD-10-CM | POA: Diagnosis not present

## 2022-06-09 DIAGNOSIS — K439 Ventral hernia without obstruction or gangrene: Secondary | ICD-10-CM | POA: Diagnosis not present

## 2022-06-09 DIAGNOSIS — I4891 Unspecified atrial fibrillation: Secondary | ICD-10-CM | POA: Diagnosis not present

## 2022-06-09 DIAGNOSIS — D692 Other nonthrombocytopenic purpura: Secondary | ICD-10-CM | POA: Diagnosis not present

## 2022-06-09 DIAGNOSIS — R350 Frequency of micturition: Secondary | ICD-10-CM | POA: Diagnosis not present

## 2022-06-09 DIAGNOSIS — G9529 Other cord compression: Secondary | ICD-10-CM | POA: Diagnosis not present

## 2022-06-09 DIAGNOSIS — L0100 Impetigo, unspecified: Secondary | ICD-10-CM | POA: Diagnosis not present

## 2022-06-09 DIAGNOSIS — E1151 Type 2 diabetes mellitus with diabetic peripheral angiopathy without gangrene: Secondary | ICD-10-CM | POA: Diagnosis not present

## 2022-06-09 DIAGNOSIS — L299 Pruritus, unspecified: Secondary | ICD-10-CM | POA: Diagnosis not present

## 2022-06-09 DIAGNOSIS — I2510 Atherosclerotic heart disease of native coronary artery without angina pectoris: Secondary | ICD-10-CM | POA: Diagnosis not present

## 2022-06-09 DIAGNOSIS — J45909 Unspecified asthma, uncomplicated: Secondary | ICD-10-CM | POA: Diagnosis not present

## 2022-06-09 DIAGNOSIS — I872 Venous insufficiency (chronic) (peripheral): Secondary | ICD-10-CM | POA: Diagnosis not present

## 2022-06-09 DIAGNOSIS — G4733 Obstructive sleep apnea (adult) (pediatric): Secondary | ICD-10-CM | POA: Diagnosis not present

## 2022-06-09 DIAGNOSIS — J449 Chronic obstructive pulmonary disease, unspecified: Secondary | ICD-10-CM | POA: Diagnosis not present

## 2022-06-09 DIAGNOSIS — E785 Hyperlipidemia, unspecified: Secondary | ICD-10-CM | POA: Diagnosis not present

## 2022-06-09 NOTE — Telephone Encounter (Signed)
Copied from [place] [phone]. Topic: Medicare AWV >> Jun 09, 2022 10:03 AM Josephina Gip wrote: Reason for CRM:  Left message for patient to call back and schedule the Medicare Annual Wellness Visit (AWV) virtually or by telephone.  Last AWV 02/25/21  Please schedule at anytime with CFP-Nurse Health Advisor.  45 minute appointment  Any questions, please call me at [phone]   Please schedule at anytime with [facility].  45 minute appointment  Any questions, please call me at [phone]

## 2022-06-14 DIAGNOSIS — L0100 Impetigo, unspecified: Secondary | ICD-10-CM | POA: Diagnosis not present

## 2022-06-14 DIAGNOSIS — E1122 Type 2 diabetes mellitus with diabetic chronic kidney disease: Secondary | ICD-10-CM | POA: Diagnosis not present

## 2022-06-14 DIAGNOSIS — G4733 Obstructive sleep apnea (adult) (pediatric): Secondary | ICD-10-CM | POA: Diagnosis not present

## 2022-06-14 DIAGNOSIS — L299 Pruritus, unspecified: Secondary | ICD-10-CM | POA: Diagnosis not present

## 2022-06-14 DIAGNOSIS — I130 Hypertensive heart and chronic kidney disease with heart failure and stage 1 through stage 4 chronic kidney disease, or unspecified chronic kidney disease: Secondary | ICD-10-CM | POA: Diagnosis not present

## 2022-06-14 DIAGNOSIS — E1151 Type 2 diabetes mellitus with diabetic peripheral angiopathy without gangrene: Secondary | ICD-10-CM | POA: Diagnosis not present

## 2022-06-14 DIAGNOSIS — N182 Chronic kidney disease, stage 2 (mild): Secondary | ICD-10-CM | POA: Diagnosis not present

## 2022-06-14 DIAGNOSIS — K439 Ventral hernia without obstruction or gangrene: Secondary | ICD-10-CM | POA: Diagnosis not present

## 2022-06-14 DIAGNOSIS — J449 Chronic obstructive pulmonary disease, unspecified: Secondary | ICD-10-CM | POA: Diagnosis not present

## 2022-06-14 DIAGNOSIS — H532 Diplopia: Secondary | ICD-10-CM | POA: Diagnosis not present

## 2022-06-14 DIAGNOSIS — I872 Venous insufficiency (chronic) (peripheral): Secondary | ICD-10-CM | POA: Diagnosis not present

## 2022-06-14 DIAGNOSIS — E538 Deficiency of other specified B group vitamins: Secondary | ICD-10-CM | POA: Diagnosis not present

## 2022-06-14 DIAGNOSIS — I4891 Unspecified atrial fibrillation: Secondary | ICD-10-CM | POA: Diagnosis not present

## 2022-06-14 DIAGNOSIS — Q845 Enlarged and hypertrophic nails: Secondary | ICD-10-CM | POA: Diagnosis not present

## 2022-06-14 DIAGNOSIS — J45909 Unspecified asthma, uncomplicated: Secondary | ICD-10-CM | POA: Diagnosis not present

## 2022-06-14 DIAGNOSIS — D692 Other nonthrombocytopenic purpura: Secondary | ICD-10-CM | POA: Diagnosis not present

## 2022-06-14 DIAGNOSIS — R21 Rash and other nonspecific skin eruption: Secondary | ICD-10-CM | POA: Diagnosis not present

## 2022-06-14 DIAGNOSIS — I5032 Chronic diastolic (congestive) heart failure: Secondary | ICD-10-CM | POA: Diagnosis not present

## 2022-06-14 DIAGNOSIS — G3184 Mild cognitive impairment, so stated: Secondary | ICD-10-CM | POA: Diagnosis not present

## 2022-06-14 DIAGNOSIS — I2510 Atherosclerotic heart disease of native coronary artery without angina pectoris: Secondary | ICD-10-CM | POA: Diagnosis not present

## 2022-06-14 DIAGNOSIS — E785 Hyperlipidemia, unspecified: Secondary | ICD-10-CM | POA: Diagnosis not present

## 2022-06-14 DIAGNOSIS — R350 Frequency of micturition: Secondary | ICD-10-CM | POA: Diagnosis not present

## 2022-06-14 DIAGNOSIS — G9529 Other cord compression: Secondary | ICD-10-CM | POA: Diagnosis not present

## 2022-06-14 DIAGNOSIS — R634 Abnormal weight loss: Secondary | ICD-10-CM | POA: Diagnosis not present

## 2022-06-15 DIAGNOSIS — L84 Corns and callosities: Secondary | ICD-10-CM | POA: Diagnosis not present

## 2022-06-15 DIAGNOSIS — B351 Tinea unguium: Secondary | ICD-10-CM | POA: Diagnosis not present

## 2022-06-15 DIAGNOSIS — E1142 Type 2 diabetes mellitus with diabetic polyneuropathy: Secondary | ICD-10-CM | POA: Diagnosis not present

## 2022-06-15 DIAGNOSIS — M79675 Pain in left toe(s): Secondary | ICD-10-CM | POA: Diagnosis not present

## 2022-06-15 DIAGNOSIS — L608 Other nail disorders: Secondary | ICD-10-CM | POA: Diagnosis not present

## 2022-06-15 DIAGNOSIS — M79674 Pain in right toe(s): Secondary | ICD-10-CM | POA: Diagnosis not present

## 2022-06-15 DIAGNOSIS — R2689 Other abnormalities of gait and mobility: Secondary | ICD-10-CM | POA: Diagnosis not present

## 2022-06-20 DIAGNOSIS — G3184 Mild cognitive impairment, so stated: Secondary | ICD-10-CM | POA: Diagnosis not present

## 2022-06-20 DIAGNOSIS — N182 Chronic kidney disease, stage 2 (mild): Secondary | ICD-10-CM | POA: Diagnosis not present

## 2022-06-20 DIAGNOSIS — I130 Hypertensive heart and chronic kidney disease with heart failure and stage 1 through stage 4 chronic kidney disease, or unspecified chronic kidney disease: Secondary | ICD-10-CM | POA: Diagnosis not present

## 2022-06-20 DIAGNOSIS — I2510 Atherosclerotic heart disease of native coronary artery without angina pectoris: Secondary | ICD-10-CM | POA: Diagnosis not present

## 2022-06-20 DIAGNOSIS — G9529 Other cord compression: Secondary | ICD-10-CM | POA: Diagnosis not present

## 2022-06-20 DIAGNOSIS — E785 Hyperlipidemia, unspecified: Secondary | ICD-10-CM | POA: Diagnosis not present

## 2022-06-20 DIAGNOSIS — R21 Rash and other nonspecific skin eruption: Secondary | ICD-10-CM | POA: Diagnosis not present

## 2022-06-20 DIAGNOSIS — I4891 Unspecified atrial fibrillation: Secondary | ICD-10-CM | POA: Diagnosis not present

## 2022-06-20 DIAGNOSIS — D692 Other nonthrombocytopenic purpura: Secondary | ICD-10-CM | POA: Diagnosis not present

## 2022-06-20 DIAGNOSIS — I872 Venous insufficiency (chronic) (peripheral): Secondary | ICD-10-CM | POA: Diagnosis not present

## 2022-06-20 DIAGNOSIS — E1151 Type 2 diabetes mellitus with diabetic peripheral angiopathy without gangrene: Secondary | ICD-10-CM | POA: Diagnosis not present

## 2022-06-20 DIAGNOSIS — Q845 Enlarged and hypertrophic nails: Secondary | ICD-10-CM | POA: Diagnosis not present

## 2022-06-20 DIAGNOSIS — J449 Chronic obstructive pulmonary disease, unspecified: Secondary | ICD-10-CM | POA: Diagnosis not present

## 2022-06-20 DIAGNOSIS — J45909 Unspecified asthma, uncomplicated: Secondary | ICD-10-CM | POA: Diagnosis not present

## 2022-06-20 DIAGNOSIS — K439 Ventral hernia without obstruction or gangrene: Secondary | ICD-10-CM | POA: Diagnosis not present

## 2022-06-20 DIAGNOSIS — E1122 Type 2 diabetes mellitus with diabetic chronic kidney disease: Secondary | ICD-10-CM | POA: Diagnosis not present

## 2022-06-20 DIAGNOSIS — R634 Abnormal weight loss: Secondary | ICD-10-CM | POA: Diagnosis not present

## 2022-06-20 DIAGNOSIS — G4733 Obstructive sleep apnea (adult) (pediatric): Secondary | ICD-10-CM | POA: Diagnosis not present

## 2022-06-20 DIAGNOSIS — H532 Diplopia: Secondary | ICD-10-CM | POA: Diagnosis not present

## 2022-06-20 DIAGNOSIS — L299 Pruritus, unspecified: Secondary | ICD-10-CM | POA: Diagnosis not present

## 2022-06-20 DIAGNOSIS — E538 Deficiency of other specified B group vitamins: Secondary | ICD-10-CM | POA: Diagnosis not present

## 2022-06-20 DIAGNOSIS — I5032 Chronic diastolic (congestive) heart failure: Secondary | ICD-10-CM | POA: Diagnosis not present

## 2022-06-20 DIAGNOSIS — R350 Frequency of micturition: Secondary | ICD-10-CM | POA: Diagnosis not present

## 2022-06-20 DIAGNOSIS — L0100 Impetigo, unspecified: Secondary | ICD-10-CM | POA: Diagnosis not present

## 2022-06-26 DIAGNOSIS — K5901 Slow transit constipation: Secondary | ICD-10-CM | POA: Diagnosis not present

## 2022-06-26 DIAGNOSIS — Z639 Problem related to primary support group, unspecified: Secondary | ICD-10-CM | POA: Diagnosis not present

## 2022-06-28 DIAGNOSIS — I2510 Atherosclerotic heart disease of native coronary artery without angina pectoris: Secondary | ICD-10-CM | POA: Diagnosis not present

## 2022-06-28 DIAGNOSIS — N182 Chronic kidney disease, stage 2 (mild): Secondary | ICD-10-CM | POA: Diagnosis not present

## 2022-06-28 DIAGNOSIS — I130 Hypertensive heart and chronic kidney disease with heart failure and stage 1 through stage 4 chronic kidney disease, or unspecified chronic kidney disease: Secondary | ICD-10-CM | POA: Diagnosis not present

## 2022-06-28 DIAGNOSIS — I4891 Unspecified atrial fibrillation: Secondary | ICD-10-CM | POA: Diagnosis not present

## 2022-06-28 DIAGNOSIS — G9529 Other cord compression: Secondary | ICD-10-CM | POA: Diagnosis not present

## 2022-06-28 DIAGNOSIS — D692 Other nonthrombocytopenic purpura: Secondary | ICD-10-CM | POA: Diagnosis not present

## 2022-06-28 DIAGNOSIS — E785 Hyperlipidemia, unspecified: Secondary | ICD-10-CM | POA: Diagnosis not present

## 2022-06-28 DIAGNOSIS — Q845 Enlarged and hypertrophic nails: Secondary | ICD-10-CM | POA: Diagnosis not present

## 2022-06-28 DIAGNOSIS — R634 Abnormal weight loss: Secondary | ICD-10-CM | POA: Diagnosis not present

## 2022-06-28 DIAGNOSIS — I5032 Chronic diastolic (congestive) heart failure: Secondary | ICD-10-CM | POA: Diagnosis not present

## 2022-06-28 DIAGNOSIS — L299 Pruritus, unspecified: Secondary | ICD-10-CM | POA: Diagnosis not present

## 2022-06-28 DIAGNOSIS — E1122 Type 2 diabetes mellitus with diabetic chronic kidney disease: Secondary | ICD-10-CM | POA: Diagnosis not present

## 2022-06-28 DIAGNOSIS — E538 Deficiency of other specified B group vitamins: Secondary | ICD-10-CM | POA: Diagnosis not present

## 2022-06-28 DIAGNOSIS — J45909 Unspecified asthma, uncomplicated: Secondary | ICD-10-CM | POA: Diagnosis not present

## 2022-06-28 DIAGNOSIS — G4733 Obstructive sleep apnea (adult) (pediatric): Secondary | ICD-10-CM | POA: Diagnosis not present

## 2022-06-28 DIAGNOSIS — K439 Ventral hernia without obstruction or gangrene: Secondary | ICD-10-CM | POA: Diagnosis not present

## 2022-06-28 DIAGNOSIS — L0100 Impetigo, unspecified: Secondary | ICD-10-CM | POA: Diagnosis not present

## 2022-06-28 DIAGNOSIS — I872 Venous insufficiency (chronic) (peripheral): Secondary | ICD-10-CM | POA: Diagnosis not present

## 2022-06-28 DIAGNOSIS — J449 Chronic obstructive pulmonary disease, unspecified: Secondary | ICD-10-CM | POA: Diagnosis not present

## 2022-06-28 DIAGNOSIS — R350 Frequency of micturition: Secondary | ICD-10-CM | POA: Diagnosis not present

## 2022-06-28 DIAGNOSIS — R21 Rash and other nonspecific skin eruption: Secondary | ICD-10-CM | POA: Diagnosis not present

## 2022-06-28 DIAGNOSIS — H532 Diplopia: Secondary | ICD-10-CM | POA: Diagnosis not present

## 2022-06-28 DIAGNOSIS — E1151 Type 2 diabetes mellitus with diabetic peripheral angiopathy without gangrene: Secondary | ICD-10-CM | POA: Diagnosis not present

## 2022-06-28 DIAGNOSIS — G3184 Mild cognitive impairment, so stated: Secondary | ICD-10-CM | POA: Diagnosis not present

## 2022-07-05 DIAGNOSIS — I482 Chronic atrial fibrillation, unspecified: Secondary | ICD-10-CM | POA: Diagnosis not present

## 2022-07-07 DIAGNOSIS — Q845 Enlarged and hypertrophic nails: Secondary | ICD-10-CM | POA: Diagnosis not present

## 2022-07-07 DIAGNOSIS — E1122 Type 2 diabetes mellitus with diabetic chronic kidney disease: Secondary | ICD-10-CM | POA: Diagnosis not present

## 2022-07-07 DIAGNOSIS — E785 Hyperlipidemia, unspecified: Secondary | ICD-10-CM | POA: Diagnosis not present

## 2022-07-07 DIAGNOSIS — H532 Diplopia: Secondary | ICD-10-CM | POA: Diagnosis not present

## 2022-07-07 DIAGNOSIS — G4733 Obstructive sleep apnea (adult) (pediatric): Secondary | ICD-10-CM | POA: Diagnosis not present

## 2022-07-07 DIAGNOSIS — I5032 Chronic diastolic (congestive) heart failure: Secondary | ICD-10-CM | POA: Diagnosis not present

## 2022-07-07 DIAGNOSIS — I4891 Unspecified atrial fibrillation: Secondary | ICD-10-CM | POA: Diagnosis not present

## 2022-07-07 DIAGNOSIS — R350 Frequency of micturition: Secondary | ICD-10-CM | POA: Diagnosis not present

## 2022-07-07 DIAGNOSIS — D692 Other nonthrombocytopenic purpura: Secondary | ICD-10-CM | POA: Diagnosis not present

## 2022-07-07 DIAGNOSIS — J45909 Unspecified asthma, uncomplicated: Secondary | ICD-10-CM | POA: Diagnosis not present

## 2022-07-07 DIAGNOSIS — E1151 Type 2 diabetes mellitus with diabetic peripheral angiopathy without gangrene: Secondary | ICD-10-CM | POA: Diagnosis not present

## 2022-07-07 DIAGNOSIS — J449 Chronic obstructive pulmonary disease, unspecified: Secondary | ICD-10-CM | POA: Diagnosis not present

## 2022-07-07 DIAGNOSIS — R634 Abnormal weight loss: Secondary | ICD-10-CM | POA: Diagnosis not present

## 2022-07-07 DIAGNOSIS — I2510 Atherosclerotic heart disease of native coronary artery without angina pectoris: Secondary | ICD-10-CM | POA: Diagnosis not present

## 2022-07-07 DIAGNOSIS — K439 Ventral hernia without obstruction or gangrene: Secondary | ICD-10-CM | POA: Diagnosis not present

## 2022-07-07 DIAGNOSIS — R21 Rash and other nonspecific skin eruption: Secondary | ICD-10-CM | POA: Diagnosis not present

## 2022-07-07 DIAGNOSIS — I130 Hypertensive heart and chronic kidney disease with heart failure and stage 1 through stage 4 chronic kidney disease, or unspecified chronic kidney disease: Secondary | ICD-10-CM | POA: Diagnosis not present

## 2022-07-07 DIAGNOSIS — L0100 Impetigo, unspecified: Secondary | ICD-10-CM | POA: Diagnosis not present

## 2022-07-07 DIAGNOSIS — G3184 Mild cognitive impairment, so stated: Secondary | ICD-10-CM | POA: Diagnosis not present

## 2022-07-07 DIAGNOSIS — L299 Pruritus, unspecified: Secondary | ICD-10-CM | POA: Diagnosis not present

## 2022-07-07 DIAGNOSIS — E538 Deficiency of other specified B group vitamins: Secondary | ICD-10-CM | POA: Diagnosis not present

## 2022-07-07 DIAGNOSIS — G9529 Other cord compression: Secondary | ICD-10-CM | POA: Diagnosis not present

## 2022-07-07 DIAGNOSIS — N182 Chronic kidney disease, stage 2 (mild): Secondary | ICD-10-CM | POA: Diagnosis not present

## 2022-07-07 DIAGNOSIS — I872 Venous insufficiency (chronic) (peripheral): Secondary | ICD-10-CM | POA: Diagnosis not present

## 2022-07-10 DIAGNOSIS — J069 Acute upper respiratory infection, unspecified: Secondary | ICD-10-CM | POA: Diagnosis not present

## 2022-07-10 DIAGNOSIS — J029 Acute pharyngitis, unspecified: Secondary | ICD-10-CM | POA: Diagnosis not present

## 2022-07-10 DIAGNOSIS — R051 Acute cough: Secondary | ICD-10-CM | POA: Diagnosis not present

## 2022-07-10 DIAGNOSIS — K5901 Slow transit constipation: Secondary | ICD-10-CM | POA: Diagnosis not present

## 2022-07-11 DIAGNOSIS — E1122 Type 2 diabetes mellitus with diabetic chronic kidney disease: Secondary | ICD-10-CM | POA: Diagnosis not present

## 2022-07-11 DIAGNOSIS — L299 Pruritus, unspecified: Secondary | ICD-10-CM | POA: Diagnosis not present

## 2022-07-11 DIAGNOSIS — I872 Venous insufficiency (chronic) (peripheral): Secondary | ICD-10-CM | POA: Diagnosis not present

## 2022-07-11 DIAGNOSIS — E538 Deficiency of other specified B group vitamins: Secondary | ICD-10-CM | POA: Diagnosis not present

## 2022-07-11 DIAGNOSIS — G4733 Obstructive sleep apnea (adult) (pediatric): Secondary | ICD-10-CM | POA: Diagnosis not present

## 2022-07-11 DIAGNOSIS — I130 Hypertensive heart and chronic kidney disease with heart failure and stage 1 through stage 4 chronic kidney disease, or unspecified chronic kidney disease: Secondary | ICD-10-CM | POA: Diagnosis not present

## 2022-07-11 DIAGNOSIS — E785 Hyperlipidemia, unspecified: Secondary | ICD-10-CM | POA: Diagnosis not present

## 2022-07-11 DIAGNOSIS — H532 Diplopia: Secondary | ICD-10-CM | POA: Diagnosis not present

## 2022-07-11 DIAGNOSIS — G3184 Mild cognitive impairment, so stated: Secondary | ICD-10-CM | POA: Diagnosis not present

## 2022-07-11 DIAGNOSIS — R634 Abnormal weight loss: Secondary | ICD-10-CM | POA: Diagnosis not present

## 2022-07-11 DIAGNOSIS — E1151 Type 2 diabetes mellitus with diabetic peripheral angiopathy without gangrene: Secondary | ICD-10-CM | POA: Diagnosis not present

## 2022-07-11 DIAGNOSIS — L0100 Impetigo, unspecified: Secondary | ICD-10-CM | POA: Diagnosis not present

## 2022-07-11 DIAGNOSIS — I5032 Chronic diastolic (congestive) heart failure: Secondary | ICD-10-CM | POA: Diagnosis not present

## 2022-07-11 DIAGNOSIS — I4891 Unspecified atrial fibrillation: Secondary | ICD-10-CM | POA: Diagnosis not present

## 2022-07-11 DIAGNOSIS — J449 Chronic obstructive pulmonary disease, unspecified: Secondary | ICD-10-CM | POA: Diagnosis not present

## 2022-07-11 DIAGNOSIS — G9529 Other cord compression: Secondary | ICD-10-CM | POA: Diagnosis not present

## 2022-07-11 DIAGNOSIS — N182 Chronic kidney disease, stage 2 (mild): Secondary | ICD-10-CM | POA: Diagnosis not present

## 2022-07-11 DIAGNOSIS — Q845 Enlarged and hypertrophic nails: Secondary | ICD-10-CM | POA: Diagnosis not present

## 2022-07-11 DIAGNOSIS — R21 Rash and other nonspecific skin eruption: Secondary | ICD-10-CM | POA: Diagnosis not present

## 2022-07-11 DIAGNOSIS — R350 Frequency of micturition: Secondary | ICD-10-CM | POA: Diagnosis not present

## 2022-07-11 DIAGNOSIS — I2510 Atherosclerotic heart disease of native coronary artery without angina pectoris: Secondary | ICD-10-CM | POA: Diagnosis not present

## 2022-07-11 DIAGNOSIS — K439 Ventral hernia without obstruction or gangrene: Secondary | ICD-10-CM | POA: Diagnosis not present

## 2022-07-11 DIAGNOSIS — D692 Other nonthrombocytopenic purpura: Secondary | ICD-10-CM | POA: Diagnosis not present

## 2022-07-11 DIAGNOSIS — J45909 Unspecified asthma, uncomplicated: Secondary | ICD-10-CM | POA: Diagnosis not present

## 2022-07-19 DIAGNOSIS — R634 Abnormal weight loss: Secondary | ICD-10-CM | POA: Diagnosis not present

## 2022-07-19 DIAGNOSIS — J45909 Unspecified asthma, uncomplicated: Secondary | ICD-10-CM | POA: Diagnosis not present

## 2022-07-19 DIAGNOSIS — H532 Diplopia: Secondary | ICD-10-CM | POA: Diagnosis not present

## 2022-07-19 DIAGNOSIS — L0100 Impetigo, unspecified: Secondary | ICD-10-CM | POA: Diagnosis not present

## 2022-07-19 DIAGNOSIS — G4733 Obstructive sleep apnea (adult) (pediatric): Secondary | ICD-10-CM | POA: Diagnosis not present

## 2022-07-19 DIAGNOSIS — J449 Chronic obstructive pulmonary disease, unspecified: Secondary | ICD-10-CM | POA: Diagnosis not present

## 2022-07-19 DIAGNOSIS — E538 Deficiency of other specified B group vitamins: Secondary | ICD-10-CM | POA: Diagnosis not present

## 2022-07-19 DIAGNOSIS — I2510 Atherosclerotic heart disease of native coronary artery without angina pectoris: Secondary | ICD-10-CM | POA: Diagnosis not present

## 2022-07-19 DIAGNOSIS — G3184 Mild cognitive impairment, so stated: Secondary | ICD-10-CM | POA: Diagnosis not present

## 2022-07-19 DIAGNOSIS — I5032 Chronic diastolic (congestive) heart failure: Secondary | ICD-10-CM | POA: Diagnosis not present

## 2022-07-19 DIAGNOSIS — L299 Pruritus, unspecified: Secondary | ICD-10-CM | POA: Diagnosis not present

## 2022-07-19 DIAGNOSIS — G9529 Other cord compression: Secondary | ICD-10-CM | POA: Diagnosis not present

## 2022-07-19 DIAGNOSIS — K439 Ventral hernia without obstruction or gangrene: Secondary | ICD-10-CM | POA: Diagnosis not present

## 2022-07-19 DIAGNOSIS — N182 Chronic kidney disease, stage 2 (mild): Secondary | ICD-10-CM | POA: Diagnosis not present

## 2022-07-19 DIAGNOSIS — I4891 Unspecified atrial fibrillation: Secondary | ICD-10-CM | POA: Diagnosis not present

## 2022-07-19 DIAGNOSIS — R21 Rash and other nonspecific skin eruption: Secondary | ICD-10-CM | POA: Diagnosis not present

## 2022-07-19 DIAGNOSIS — E1151 Type 2 diabetes mellitus with diabetic peripheral angiopathy without gangrene: Secondary | ICD-10-CM | POA: Diagnosis not present

## 2022-07-19 DIAGNOSIS — E1122 Type 2 diabetes mellitus with diabetic chronic kidney disease: Secondary | ICD-10-CM | POA: Diagnosis not present

## 2022-07-19 DIAGNOSIS — I872 Venous insufficiency (chronic) (peripheral): Secondary | ICD-10-CM | POA: Diagnosis not present

## 2022-07-19 DIAGNOSIS — E785 Hyperlipidemia, unspecified: Secondary | ICD-10-CM | POA: Diagnosis not present

## 2022-07-19 DIAGNOSIS — R350 Frequency of micturition: Secondary | ICD-10-CM | POA: Diagnosis not present

## 2022-07-19 DIAGNOSIS — Q845 Enlarged and hypertrophic nails: Secondary | ICD-10-CM | POA: Diagnosis not present

## 2022-07-19 DIAGNOSIS — I130 Hypertensive heart and chronic kidney disease with heart failure and stage 1 through stage 4 chronic kidney disease, or unspecified chronic kidney disease: Secondary | ICD-10-CM | POA: Diagnosis not present

## 2022-07-19 DIAGNOSIS — D692 Other nonthrombocytopenic purpura: Secondary | ICD-10-CM | POA: Diagnosis not present

## 2022-07-20 DIAGNOSIS — I10 Essential (primary) hypertension: Secondary | ICD-10-CM | POA: Diagnosis not present

## 2022-07-24 DIAGNOSIS — I5022 Chronic systolic (congestive) heart failure: Secondary | ICD-10-CM | POA: Diagnosis not present

## 2022-07-24 DIAGNOSIS — Z7901 Long term (current) use of anticoagulants: Secondary | ICD-10-CM | POA: Diagnosis not present

## 2022-07-24 DIAGNOSIS — J45909 Unspecified asthma, uncomplicated: Secondary | ICD-10-CM | POA: Diagnosis not present

## 2022-07-24 DIAGNOSIS — E538 Deficiency of other specified B group vitamins: Secondary | ICD-10-CM | POA: Diagnosis not present

## 2022-07-24 DIAGNOSIS — J449 Chronic obstructive pulmonary disease, unspecified: Secondary | ICD-10-CM | POA: Diagnosis not present

## 2022-07-24 DIAGNOSIS — K5901 Slow transit constipation: Secondary | ICD-10-CM | POA: Diagnosis not present

## 2022-07-24 DIAGNOSIS — I739 Peripheral vascular disease, unspecified: Secondary | ICD-10-CM | POA: Diagnosis not present

## 2022-07-24 DIAGNOSIS — G9529 Other cord compression: Secondary | ICD-10-CM | POA: Diagnosis not present

## 2022-07-24 DIAGNOSIS — N182 Chronic kidney disease, stage 2 (mild): Secondary | ICD-10-CM | POA: Diagnosis not present

## 2022-07-24 DIAGNOSIS — I4891 Unspecified atrial fibrillation: Secondary | ICD-10-CM | POA: Diagnosis not present

## 2022-07-24 DIAGNOSIS — I5032 Chronic diastolic (congestive) heart failure: Secondary | ICD-10-CM | POA: Diagnosis not present

## 2022-07-24 DIAGNOSIS — R634 Abnormal weight loss: Secondary | ICD-10-CM | POA: Diagnosis not present

## 2022-07-24 DIAGNOSIS — Q845 Enlarged and hypertrophic nails: Secondary | ICD-10-CM | POA: Diagnosis not present

## 2022-07-24 DIAGNOSIS — I130 Hypertensive heart and chronic kidney disease with heart failure and stage 1 through stage 4 chronic kidney disease, or unspecified chronic kidney disease: Secondary | ICD-10-CM | POA: Diagnosis not present

## 2022-07-24 DIAGNOSIS — R350 Frequency of micturition: Secondary | ICD-10-CM | POA: Diagnosis not present

## 2022-07-24 DIAGNOSIS — G3184 Mild cognitive impairment, so stated: Secondary | ICD-10-CM | POA: Diagnosis not present

## 2022-07-24 DIAGNOSIS — R21 Rash and other nonspecific skin eruption: Secondary | ICD-10-CM | POA: Diagnosis not present

## 2022-07-24 DIAGNOSIS — I872 Venous insufficiency (chronic) (peripheral): Secondary | ICD-10-CM | POA: Diagnosis not present

## 2022-07-24 DIAGNOSIS — I482 Chronic atrial fibrillation, unspecified: Secondary | ICD-10-CM | POA: Diagnosis not present

## 2022-07-24 DIAGNOSIS — L0100 Impetigo, unspecified: Secondary | ICD-10-CM | POA: Diagnosis not present

## 2022-07-24 DIAGNOSIS — E1122 Type 2 diabetes mellitus with diabetic chronic kidney disease: Secondary | ICD-10-CM | POA: Diagnosis not present

## 2022-07-24 DIAGNOSIS — D692 Other nonthrombocytopenic purpura: Secondary | ICD-10-CM | POA: Diagnosis not present

## 2022-07-24 DIAGNOSIS — L299 Pruritus, unspecified: Secondary | ICD-10-CM | POA: Diagnosis not present

## 2022-07-24 DIAGNOSIS — K439 Ventral hernia without obstruction or gangrene: Secondary | ICD-10-CM | POA: Diagnosis not present

## 2022-07-24 DIAGNOSIS — E1151 Type 2 diabetes mellitus with diabetic peripheral angiopathy without gangrene: Secondary | ICD-10-CM | POA: Diagnosis not present

## 2022-07-24 DIAGNOSIS — H532 Diplopia: Secondary | ICD-10-CM | POA: Diagnosis not present

## 2022-07-24 DIAGNOSIS — I2510 Atherosclerotic heart disease of native coronary artery without angina pectoris: Secondary | ICD-10-CM | POA: Diagnosis not present

## 2022-07-24 DIAGNOSIS — G4733 Obstructive sleep apnea (adult) (pediatric): Secondary | ICD-10-CM | POA: Diagnosis not present

## 2022-07-24 DIAGNOSIS — E785 Hyperlipidemia, unspecified: Secondary | ICD-10-CM | POA: Diagnosis not present

## 2022-07-26 DIAGNOSIS — K439 Ventral hernia without obstruction or gangrene: Secondary | ICD-10-CM | POA: Diagnosis not present

## 2022-07-26 DIAGNOSIS — G4733 Obstructive sleep apnea (adult) (pediatric): Secondary | ICD-10-CM | POA: Diagnosis not present

## 2022-07-26 DIAGNOSIS — I872 Venous insufficiency (chronic) (peripheral): Secondary | ICD-10-CM | POA: Diagnosis not present

## 2022-07-26 DIAGNOSIS — G9529 Other cord compression: Secondary | ICD-10-CM | POA: Diagnosis not present

## 2022-07-26 DIAGNOSIS — H532 Diplopia: Secondary | ICD-10-CM | POA: Diagnosis not present

## 2022-07-26 DIAGNOSIS — L0100 Impetigo, unspecified: Secondary | ICD-10-CM | POA: Diagnosis not present

## 2022-07-26 DIAGNOSIS — E538 Deficiency of other specified B group vitamins: Secondary | ICD-10-CM | POA: Diagnosis not present

## 2022-07-26 DIAGNOSIS — I130 Hypertensive heart and chronic kidney disease with heart failure and stage 1 through stage 4 chronic kidney disease, or unspecified chronic kidney disease: Secondary | ICD-10-CM | POA: Diagnosis not present

## 2022-07-26 DIAGNOSIS — G3184 Mild cognitive impairment, so stated: Secondary | ICD-10-CM | POA: Diagnosis not present

## 2022-07-26 DIAGNOSIS — R350 Frequency of micturition: Secondary | ICD-10-CM | POA: Diagnosis not present

## 2022-07-26 DIAGNOSIS — I2510 Atherosclerotic heart disease of native coronary artery without angina pectoris: Secondary | ICD-10-CM | POA: Diagnosis not present

## 2022-07-26 DIAGNOSIS — N182 Chronic kidney disease, stage 2 (mild): Secondary | ICD-10-CM | POA: Diagnosis not present

## 2022-07-26 DIAGNOSIS — E1122 Type 2 diabetes mellitus with diabetic chronic kidney disease: Secondary | ICD-10-CM | POA: Diagnosis not present

## 2022-07-26 DIAGNOSIS — R21 Rash and other nonspecific skin eruption: Secondary | ICD-10-CM | POA: Diagnosis not present

## 2022-07-26 DIAGNOSIS — L299 Pruritus, unspecified: Secondary | ICD-10-CM | POA: Diagnosis not present

## 2022-07-26 DIAGNOSIS — J449 Chronic obstructive pulmonary disease, unspecified: Secondary | ICD-10-CM | POA: Diagnosis not present

## 2022-07-26 DIAGNOSIS — E785 Hyperlipidemia, unspecified: Secondary | ICD-10-CM | POA: Diagnosis not present

## 2022-07-26 DIAGNOSIS — I5032 Chronic diastolic (congestive) heart failure: Secondary | ICD-10-CM | POA: Diagnosis not present

## 2022-07-26 DIAGNOSIS — D692 Other nonthrombocytopenic purpura: Secondary | ICD-10-CM | POA: Diagnosis not present

## 2022-07-26 DIAGNOSIS — I4891 Unspecified atrial fibrillation: Secondary | ICD-10-CM | POA: Diagnosis not present

## 2022-07-26 DIAGNOSIS — J45909 Unspecified asthma, uncomplicated: Secondary | ICD-10-CM | POA: Diagnosis not present

## 2022-07-26 DIAGNOSIS — E1151 Type 2 diabetes mellitus with diabetic peripheral angiopathy without gangrene: Secondary | ICD-10-CM | POA: Diagnosis not present

## 2022-07-26 DIAGNOSIS — R634 Abnormal weight loss: Secondary | ICD-10-CM | POA: Diagnosis not present

## 2022-07-26 DIAGNOSIS — Q845 Enlarged and hypertrophic nails: Secondary | ICD-10-CM | POA: Diagnosis not present

## 2022-07-27 DIAGNOSIS — I10 Essential (primary) hypertension: Secondary | ICD-10-CM | POA: Diagnosis not present

## 2022-08-02 DIAGNOSIS — I130 Hypertensive heart and chronic kidney disease with heart failure and stage 1 through stage 4 chronic kidney disease, or unspecified chronic kidney disease: Secondary | ICD-10-CM | POA: Diagnosis not present

## 2022-08-02 DIAGNOSIS — I5032 Chronic diastolic (congestive) heart failure: Secondary | ICD-10-CM | POA: Diagnosis not present

## 2022-08-02 DIAGNOSIS — E1122 Type 2 diabetes mellitus with diabetic chronic kidney disease: Secondary | ICD-10-CM | POA: Diagnosis not present

## 2022-08-03 DIAGNOSIS — I10 Essential (primary) hypertension: Secondary | ICD-10-CM | POA: Diagnosis not present

## 2022-08-04 ENCOUNTER — Telehealth: Payer: Self-pay | Admitting: *Deleted

## 2022-08-04 NOTE — Patient Outreach (Signed)
  Care Coordination   08/04/2022 Name: Evan [name] MRN: [id number] DOB: Nov 29, 1955   Care Coordination Outreach Attempts:  An unsuccessful telephone outreach was attempted today to offer the patient information about available care coordination services as a benefit of their health plan.   Follow Up Plan:  Additional outreach attempts will be made to offer the patient care coordination information and services.   Encounter Outcome:  No Answer   Care Coordination Interventions:  No, not indicated    Valente David, RN, MSN, [facility] Care Management Care Management Coordinator [phone]

## 2022-08-09 ENCOUNTER — Telehealth: Payer: Self-pay | Admitting: *Deleted

## 2022-08-09 NOTE — Patient Outreach (Signed)
  Care Coordination   08/09/2022 Name: Evan [name] MRN: [id number] DOB: Mar 21, 1956   Care Coordination Outreach Attempts:  A second unsuccessful outreach was attempted today to offer the patient with information about available care coordination services as a benefit of their health plan.     Follow Up Plan:  No further outreach attempts will be made at this time. We have been unable to contact the patient to offer or enroll patient in care coordination services  Patient facility resident at [facility].   Encounter Outcome:  No Answer   Care Coordination Interventions:  No, not indicated    Valente David, RN, MSN, [facility] Care Management Care Management Coordinator [phone]

## 2022-08-10 DIAGNOSIS — E876 Hypokalemia: Secondary | ICD-10-CM | POA: Diagnosis not present

## 2022-08-28 DIAGNOSIS — B372 Candidiasis of skin and nail: Secondary | ICD-10-CM | POA: Diagnosis not present

## 2022-08-28 DIAGNOSIS — Z7189 Other specified counseling: Secondary | ICD-10-CM | POA: Diagnosis not present

## 2022-08-28 DIAGNOSIS — E876 Hypokalemia: Secondary | ICD-10-CM | POA: Diagnosis not present

## 2022-08-28 DIAGNOSIS — Z79899 Other long term (current) drug therapy: Secondary | ICD-10-CM | POA: Diagnosis not present

## 2022-08-28 DIAGNOSIS — I509 Heart failure, unspecified: Secondary | ICD-10-CM | POA: Diagnosis not present

## 2022-08-30 DIAGNOSIS — E1159 Type 2 diabetes mellitus with other circulatory complications: Secondary | ICD-10-CM | POA: Diagnosis not present

## 2022-08-30 DIAGNOSIS — D6869 Other thrombophilia: Secondary | ICD-10-CM | POA: Diagnosis not present

## 2022-08-30 DIAGNOSIS — E11319 Type 2 diabetes mellitus with unspecified diabetic retinopathy without macular edema: Secondary | ICD-10-CM | POA: Diagnosis not present

## 2022-08-30 DIAGNOSIS — K219 Gastro-esophageal reflux disease without esophagitis: Secondary | ICD-10-CM | POA: Diagnosis not present

## 2022-08-30 DIAGNOSIS — I252 Old myocardial infarction: Secondary | ICD-10-CM | POA: Diagnosis not present

## 2022-08-30 DIAGNOSIS — I700 Atherosclerosis of aorta: Secondary | ICD-10-CM | POA: Diagnosis not present

## 2022-08-30 DIAGNOSIS — I2510 Atherosclerotic heart disease of native coronary artery without angina pectoris: Secondary | ICD-10-CM | POA: Diagnosis not present

## 2022-08-30 DIAGNOSIS — G4733 Obstructive sleep apnea (adult) (pediatric): Secondary | ICD-10-CM | POA: Diagnosis not present

## 2022-08-30 DIAGNOSIS — E1151 Type 2 diabetes mellitus with diabetic peripheral angiopathy without gangrene: Secondary | ICD-10-CM | POA: Diagnosis not present

## 2022-08-30 DIAGNOSIS — R7989 Other specified abnormal findings of blood chemistry: Secondary | ICD-10-CM | POA: Diagnosis not present

## 2022-08-30 DIAGNOSIS — I509 Heart failure, unspecified: Secondary | ICD-10-CM | POA: Diagnosis not present

## 2022-08-30 DIAGNOSIS — E782 Mixed hyperlipidemia: Secondary | ICD-10-CM | POA: Diagnosis not present

## 2022-08-30 DIAGNOSIS — I152 Hypertension secondary to endocrine disorders: Secondary | ICD-10-CM | POA: Diagnosis not present

## 2022-08-30 DIAGNOSIS — M810 Age-related osteoporosis without current pathological fracture: Secondary | ICD-10-CM | POA: Diagnosis not present

## 2022-08-30 DIAGNOSIS — I25810 Atherosclerosis of coronary artery bypass graft(s) without angina pectoris: Secondary | ICD-10-CM | POA: Diagnosis not present

## 2022-08-30 DIAGNOSIS — I872 Venous insufficiency (chronic) (peripheral): Secondary | ICD-10-CM | POA: Diagnosis not present

## 2022-08-30 DIAGNOSIS — K224 Dyskinesia of esophagus: Secondary | ICD-10-CM | POA: Diagnosis not present

## 2022-08-30 DIAGNOSIS — J4489 Other specified chronic obstructive pulmonary disease: Secondary | ICD-10-CM | POA: Diagnosis not present

## 2022-09-01 DIAGNOSIS — M810 Age-related osteoporosis without current pathological fracture: Secondary | ICD-10-CM | POA: Diagnosis not present

## 2022-09-01 DIAGNOSIS — E782 Mixed hyperlipidemia: Secondary | ICD-10-CM | POA: Diagnosis not present

## 2022-09-01 DIAGNOSIS — I2510 Atherosclerotic heart disease of native coronary artery without angina pectoris: Secondary | ICD-10-CM | POA: Diagnosis not present

## 2022-09-01 DIAGNOSIS — I509 Heart failure, unspecified: Secondary | ICD-10-CM | POA: Diagnosis not present

## 2022-09-01 DIAGNOSIS — D6869 Other thrombophilia: Secondary | ICD-10-CM | POA: Diagnosis not present

## 2022-09-01 DIAGNOSIS — R7989 Other specified abnormal findings of blood chemistry: Secondary | ICD-10-CM | POA: Diagnosis not present

## 2022-09-01 DIAGNOSIS — K219 Gastro-esophageal reflux disease without esophagitis: Secondary | ICD-10-CM | POA: Diagnosis not present

## 2022-09-01 DIAGNOSIS — E1151 Type 2 diabetes mellitus with diabetic peripheral angiopathy without gangrene: Secondary | ICD-10-CM | POA: Diagnosis not present

## 2022-09-01 DIAGNOSIS — J4489 Other specified chronic obstructive pulmonary disease: Secondary | ICD-10-CM | POA: Diagnosis not present

## 2022-09-01 DIAGNOSIS — G4733 Obstructive sleep apnea (adult) (pediatric): Secondary | ICD-10-CM | POA: Diagnosis not present

## 2022-09-01 DIAGNOSIS — E1159 Type 2 diabetes mellitus with other circulatory complications: Secondary | ICD-10-CM | POA: Diagnosis not present

## 2022-09-01 DIAGNOSIS — I700 Atherosclerosis of aorta: Secondary | ICD-10-CM | POA: Diagnosis not present

## 2022-09-01 DIAGNOSIS — I25810 Atherosclerosis of coronary artery bypass graft(s) without angina pectoris: Secondary | ICD-10-CM | POA: Diagnosis not present

## 2022-09-01 DIAGNOSIS — I872 Venous insufficiency (chronic) (peripheral): Secondary | ICD-10-CM | POA: Diagnosis not present

## 2022-09-01 DIAGNOSIS — I252 Old myocardial infarction: Secondary | ICD-10-CM | POA: Diagnosis not present

## 2022-09-01 DIAGNOSIS — K224 Dyskinesia of esophagus: Secondary | ICD-10-CM | POA: Diagnosis not present

## 2022-09-01 DIAGNOSIS — E11319 Type 2 diabetes mellitus with unspecified diabetic retinopathy without macular edema: Secondary | ICD-10-CM | POA: Diagnosis not present

## 2022-09-01 DIAGNOSIS — I152 Hypertension secondary to endocrine disorders: Secondary | ICD-10-CM | POA: Diagnosis not present

## 2022-09-04 DIAGNOSIS — I509 Heart failure, unspecified: Secondary | ICD-10-CM | POA: Diagnosis not present

## 2022-09-04 DIAGNOSIS — I700 Atherosclerosis of aorta: Secondary | ICD-10-CM | POA: Diagnosis not present

## 2022-09-04 DIAGNOSIS — E1151 Type 2 diabetes mellitus with diabetic peripheral angiopathy without gangrene: Secondary | ICD-10-CM | POA: Diagnosis not present

## 2022-09-04 DIAGNOSIS — I152 Hypertension secondary to endocrine disorders: Secondary | ICD-10-CM | POA: Diagnosis not present

## 2022-09-04 DIAGNOSIS — G4733 Obstructive sleep apnea (adult) (pediatric): Secondary | ICD-10-CM | POA: Diagnosis not present

## 2022-09-04 DIAGNOSIS — R7989 Other specified abnormal findings of blood chemistry: Secondary | ICD-10-CM | POA: Diagnosis not present

## 2022-09-04 DIAGNOSIS — I252 Old myocardial infarction: Secondary | ICD-10-CM | POA: Diagnosis not present

## 2022-09-04 DIAGNOSIS — J4489 Other specified chronic obstructive pulmonary disease: Secondary | ICD-10-CM | POA: Diagnosis not present

## 2022-09-04 DIAGNOSIS — D6869 Other thrombophilia: Secondary | ICD-10-CM | POA: Diagnosis not present

## 2022-09-04 DIAGNOSIS — K224 Dyskinesia of esophagus: Secondary | ICD-10-CM | POA: Diagnosis not present

## 2022-09-04 DIAGNOSIS — K219 Gastro-esophageal reflux disease without esophagitis: Secondary | ICD-10-CM | POA: Diagnosis not present

## 2022-09-04 DIAGNOSIS — E1159 Type 2 diabetes mellitus with other circulatory complications: Secondary | ICD-10-CM | POA: Diagnosis not present

## 2022-09-04 DIAGNOSIS — M810 Age-related osteoporosis without current pathological fracture: Secondary | ICD-10-CM | POA: Diagnosis not present

## 2022-09-04 DIAGNOSIS — I2510 Atherosclerotic heart disease of native coronary artery without angina pectoris: Secondary | ICD-10-CM | POA: Diagnosis not present

## 2022-09-04 DIAGNOSIS — E782 Mixed hyperlipidemia: Secondary | ICD-10-CM | POA: Diagnosis not present

## 2022-09-04 DIAGNOSIS — I872 Venous insufficiency (chronic) (peripheral): Secondary | ICD-10-CM | POA: Diagnosis not present

## 2022-09-04 DIAGNOSIS — E11319 Type 2 diabetes mellitus with unspecified diabetic retinopathy without macular edema: Secondary | ICD-10-CM | POA: Diagnosis not present

## 2022-09-04 DIAGNOSIS — I25810 Atherosclerosis of coronary artery bypass graft(s) without angina pectoris: Secondary | ICD-10-CM | POA: Diagnosis not present

## 2022-09-05 DIAGNOSIS — I152 Hypertension secondary to endocrine disorders: Secondary | ICD-10-CM | POA: Diagnosis not present

## 2022-09-05 DIAGNOSIS — J4489 Other specified chronic obstructive pulmonary disease: Secondary | ICD-10-CM | POA: Diagnosis not present

## 2022-09-05 DIAGNOSIS — I700 Atherosclerosis of aorta: Secondary | ICD-10-CM | POA: Diagnosis not present

## 2022-09-05 DIAGNOSIS — E782 Mixed hyperlipidemia: Secondary | ICD-10-CM | POA: Diagnosis not present

## 2022-09-05 DIAGNOSIS — I25810 Atherosclerosis of coronary artery bypass graft(s) without angina pectoris: Secondary | ICD-10-CM | POA: Diagnosis not present

## 2022-09-05 DIAGNOSIS — D6869 Other thrombophilia: Secondary | ICD-10-CM | POA: Diagnosis not present

## 2022-09-05 DIAGNOSIS — E1151 Type 2 diabetes mellitus with diabetic peripheral angiopathy without gangrene: Secondary | ICD-10-CM | POA: Diagnosis not present

## 2022-09-05 DIAGNOSIS — I509 Heart failure, unspecified: Secondary | ICD-10-CM | POA: Diagnosis not present

## 2022-09-05 DIAGNOSIS — E1159 Type 2 diabetes mellitus with other circulatory complications: Secondary | ICD-10-CM | POA: Diagnosis not present

## 2022-09-05 DIAGNOSIS — K224 Dyskinesia of esophagus: Secondary | ICD-10-CM | POA: Diagnosis not present

## 2022-09-05 DIAGNOSIS — I252 Old myocardial infarction: Secondary | ICD-10-CM | POA: Diagnosis not present

## 2022-09-05 DIAGNOSIS — M810 Age-related osteoporosis without current pathological fracture: Secondary | ICD-10-CM | POA: Diagnosis not present

## 2022-09-05 DIAGNOSIS — K219 Gastro-esophageal reflux disease without esophagitis: Secondary | ICD-10-CM | POA: Diagnosis not present

## 2022-09-05 DIAGNOSIS — E11319 Type 2 diabetes mellitus with unspecified diabetic retinopathy without macular edema: Secondary | ICD-10-CM | POA: Diagnosis not present

## 2022-09-05 DIAGNOSIS — R7989 Other specified abnormal findings of blood chemistry: Secondary | ICD-10-CM | POA: Diagnosis not present

## 2022-09-05 DIAGNOSIS — G4733 Obstructive sleep apnea (adult) (pediatric): Secondary | ICD-10-CM | POA: Diagnosis not present

## 2022-09-05 DIAGNOSIS — I872 Venous insufficiency (chronic) (peripheral): Secondary | ICD-10-CM | POA: Diagnosis not present

## 2022-09-05 DIAGNOSIS — I2510 Atherosclerotic heart disease of native coronary artery without angina pectoris: Secondary | ICD-10-CM | POA: Diagnosis not present

## 2022-09-07 DIAGNOSIS — G4733 Obstructive sleep apnea (adult) (pediatric): Secondary | ICD-10-CM | POA: Diagnosis not present

## 2022-09-07 DIAGNOSIS — K224 Dyskinesia of esophagus: Secondary | ICD-10-CM | POA: Diagnosis not present

## 2022-09-07 DIAGNOSIS — K219 Gastro-esophageal reflux disease without esophagitis: Secondary | ICD-10-CM | POA: Diagnosis not present

## 2022-09-07 DIAGNOSIS — I2510 Atherosclerotic heart disease of native coronary artery without angina pectoris: Secondary | ICD-10-CM | POA: Diagnosis not present

## 2022-09-07 DIAGNOSIS — E782 Mixed hyperlipidemia: Secondary | ICD-10-CM | POA: Diagnosis not present

## 2022-09-07 DIAGNOSIS — E1151 Type 2 diabetes mellitus with diabetic peripheral angiopathy without gangrene: Secondary | ICD-10-CM | POA: Diagnosis not present

## 2022-09-07 DIAGNOSIS — I509 Heart failure, unspecified: Secondary | ICD-10-CM | POA: Diagnosis not present

## 2022-09-07 DIAGNOSIS — E1159 Type 2 diabetes mellitus with other circulatory complications: Secondary | ICD-10-CM | POA: Diagnosis not present

## 2022-09-07 DIAGNOSIS — D6869 Other thrombophilia: Secondary | ICD-10-CM | POA: Diagnosis not present

## 2022-09-07 DIAGNOSIS — J4489 Other specified chronic obstructive pulmonary disease: Secondary | ICD-10-CM | POA: Diagnosis not present

## 2022-09-07 DIAGNOSIS — M810 Age-related osteoporosis without current pathological fracture: Secondary | ICD-10-CM | POA: Diagnosis not present

## 2022-09-07 DIAGNOSIS — I700 Atherosclerosis of aorta: Secondary | ICD-10-CM | POA: Diagnosis not present

## 2022-09-07 DIAGNOSIS — I25810 Atherosclerosis of coronary artery bypass graft(s) without angina pectoris: Secondary | ICD-10-CM | POA: Diagnosis not present

## 2022-09-07 DIAGNOSIS — I152 Hypertension secondary to endocrine disorders: Secondary | ICD-10-CM | POA: Diagnosis not present

## 2022-09-07 DIAGNOSIS — R7989 Other specified abnormal findings of blood chemistry: Secondary | ICD-10-CM | POA: Diagnosis not present

## 2022-09-07 DIAGNOSIS — E11319 Type 2 diabetes mellitus with unspecified diabetic retinopathy without macular edema: Secondary | ICD-10-CM | POA: Diagnosis not present

## 2022-09-07 DIAGNOSIS — I252 Old myocardial infarction: Secondary | ICD-10-CM | POA: Diagnosis not present

## 2022-09-07 DIAGNOSIS — I872 Venous insufficiency (chronic) (peripheral): Secondary | ICD-10-CM | POA: Diagnosis not present

## 2022-09-11 ENCOUNTER — Emergency Department
Admission: EM | Admit: 2022-09-11 | Discharge: 2022-09-11 | Disposition: A | Discharge status: Skilled Nursing Facility | Payer: 59 | Attending: Student in an Organized Health Care Education/Training Program | Admitting: Student in an Organized Health Care Education/Training Program

## 2022-09-11 ENCOUNTER — Emergency Department: Payer: 59

## 2022-09-11 DIAGNOSIS — J449 Chronic obstructive pulmonary disease, unspecified: Secondary | ICD-10-CM | POA: Diagnosis not present

## 2022-09-11 DIAGNOSIS — M545 Low back pain, unspecified: Secondary | ICD-10-CM | POA: Diagnosis not present

## 2022-09-11 DIAGNOSIS — R410 Disorientation, unspecified: Secondary | ICD-10-CM | POA: Diagnosis not present

## 2022-09-11 DIAGNOSIS — I2510 Atherosclerotic heart disease of native coronary artery without angina pectoris: Secondary | ICD-10-CM | POA: Diagnosis not present

## 2022-09-11 DIAGNOSIS — R29898 Other symptoms and signs involving the musculoskeletal system: Secondary | ICD-10-CM | POA: Diagnosis not present

## 2022-09-11 DIAGNOSIS — R200 Anesthesia of skin: Secondary | ICD-10-CM | POA: Diagnosis not present

## 2022-09-11 DIAGNOSIS — Y92129 Unspecified place in nursing home as the place of occurrence of the external cause: Secondary | ICD-10-CM | POA: Insufficient documentation

## 2022-09-11 DIAGNOSIS — M79605 Pain in left leg: Secondary | ICD-10-CM | POA: Diagnosis not present

## 2022-09-11 DIAGNOSIS — Z7901 Long term (current) use of anticoagulants: Secondary | ICD-10-CM | POA: Diagnosis not present

## 2022-09-11 DIAGNOSIS — Z743 Need for continuous supervision: Secondary | ICD-10-CM | POA: Diagnosis not present

## 2022-09-11 DIAGNOSIS — W19XXXA Unspecified fall, initial encounter: Secondary | ICD-10-CM

## 2022-09-11 DIAGNOSIS — R531 Weakness: Secondary | ICD-10-CM | POA: Diagnosis not present

## 2022-09-11 DIAGNOSIS — R55 Syncope and collapse: Secondary | ICD-10-CM | POA: Diagnosis not present

## 2022-09-11 DIAGNOSIS — S0990XA Unspecified injury of head, initial encounter: Secondary | ICD-10-CM | POA: Insufficient documentation

## 2022-09-11 DIAGNOSIS — S0093XA Contusion of unspecified part of head, initial encounter: Secondary | ICD-10-CM | POA: Diagnosis not present

## 2022-09-11 DIAGNOSIS — J9811 Atelectasis: Secondary | ICD-10-CM | POA: Diagnosis not present

## 2022-09-11 DIAGNOSIS — I6381 Other cerebral infarction due to occlusion or stenosis of small artery: Secondary | ICD-10-CM | POA: Diagnosis not present

## 2022-09-11 DIAGNOSIS — R6889 Other general symptoms and signs: Secondary | ICD-10-CM | POA: Diagnosis not present

## 2022-09-11 DIAGNOSIS — R079 Chest pain, unspecified: Secondary | ICD-10-CM | POA: Diagnosis not present

## 2022-09-11 DIAGNOSIS — W01198A Fall on same level from slipping, tripping and stumbling with subsequent striking against other object, initial encounter: Secondary | ICD-10-CM | POA: Diagnosis not present

## 2022-09-11 LAB — COMPREHENSIVE METABOLIC PANEL
ALT: 16 U/L (ref 0–44)
AST: 30 U/L (ref 15–41)
Albumin: 3.8 g/dL (ref 3.5–5.0)
Alkaline Phosphatase: 95 U/L (ref 38–126)
Anion gap: 14 (ref 5–15)
BUN: 19 mg/dL (ref 8–23)
CO2: 20 mmol/L — ABNORMAL LOW (ref 22–32)
Calcium: 9.1 mg/dL (ref 8.9–10.3)
Chloride: 103 mmol/L (ref 98–111)
Creatinine, Ser: 1.24 mg/dL (ref 0.61–1.24)
GFR, Estimated: >60 mL/min (ref >60)
Glucose, Bld: 167 mg/dL — ABNORMAL HIGH (ref 70–99)
Potassium: 3.9 mmol/L (ref 3.5–5.1)
Sodium: 137 mmol/L (ref 135–145)
Total Bilirubin: 1.1 mg/dL (ref 0.3–1.2)
Total Protein: 7.2 g/dL (ref 6.5–8.1)

## 2022-09-11 LAB — CBC WITH DIFFERENTIAL/PLATELET
Abs Immature Granulocytes: 0.04 10*3/uL (ref 0.00–0.07)
Basophils Absolute: 0.1 10*3/uL (ref 0.0–0.1)
Basophils Relative: 1 %
Eosinophils Absolute: 0.1 10*3/uL (ref 0.0–0.5)
Eosinophils Relative: 2 %
HCT: 43.5 % (ref 39.0–52.0)
Hemoglobin: 14 g/dL (ref 13.0–17.0)
Immature Granulocytes: 1 %
Lymphocytes Relative: 10 %
Lymphs Abs: 0.9 10*3/uL (ref 0.7–4.0)
MCH: 28.7 pg (ref 26.0–34.0)
MCHC: 32.2 g/dL (ref 30.0–36.0)
MCV: 89.1 fL (ref 80.0–100.0)
Monocytes Absolute: 0.6 10*3/uL (ref 0.1–1.0)
Monocytes Relative: 7 %
Neutro Abs: 6.9 10*3/uL (ref 1.7–7.7)
Neutrophils Relative %: 79 %
Platelets: 276 10*3/uL (ref 150–400)
RBC: 4.88 MIL/uL (ref 4.22–5.81)
RDW: 14.6 % (ref 11.5–15.5)
WBC: 8.6 10*3/uL (ref 4.0–10.5)
nRBC: 0 % (ref 0.0–0.2)

## 2022-09-11 LAB — TROPONIN I (HIGH SENSITIVITY): Troponin I (High Sensitivity): 13 ng/L (ref <18)

## 2022-09-11 NOTE — ED Notes (Signed)
RN attempted to contact [employer]. Unable to reach with no answer.

## 2022-09-11 NOTE — ED Notes (Signed)
Called for [facility] transport to [place]

## 2022-09-11 NOTE — ED Provider Notes (Signed)
[facility] Provider Note    Event Date/Time   First MD Initiated Contact with Patient 09/11/22 [phone]     (approximate)   History   Fall   HPI  DEEP BONAWITZ is a 67 y.o. male history of schizophrenia, cad, afib, copd presenting from the care facility after reported "weakness "for several weeks as well as 2 falls.  Does not recall whether he hit his head as he states that he has increasing memory issues.  No reported LOC.  He denies any headache.  No neck pain.  Does have low back pain.     Physical Exam   Triage Vital Signs: ED Triage Vitals  Enc Vitals Group     BP 09/11/22 0826 106/76     Pulse Rate 09/11/22 0826 (!) 107     Resp 09/11/22 0826 18     Temp 09/11/22 0826 98.7 F (37.1 C)     Temp Source 09/11/22 0826 Oral     SpO2 09/11/22 0826 96 %     Weight 09/11/22 0828 198 lb (89.8 kg)     Height 09/11/22 0828 '5\' 5"'$  (1.651 m)     Head Circumference --      Peak Flow --      Pain Score 09/11/22 0828 0     Pain Loc --      Pain Edu? --      Excl. in [place]? --     Most recent vital signs: Vitals:   09/11/22 0826  BP: 106/76  Pulse: (!) 107  Resp: 18  Temp: 98.7 F (37.1 C)  SpO2: 96%     Constitutional: Alert  Eyes: Conjunctivae are normal.  Head: Atraumatic. Nose: No congestion/rhinnorhea. Mouth/Throat: Mucous membranes are moist.   Neck: Painless ROM.  Cardiovascular:   Good peripheral circulation. Respiratory: Normal respiratory effort.  No retractions.  Gastrointestinal: Soft and nontender.  Musculoskeletal:  no deformity Neurologic:  MAE spontaneously. No gross focal neurologic deficits are appreciated.  Skin:  Skin is warm, dry and intact. No rash noted. Psychiatric: calm and cooperative    ED Results / Procedures / Treatments   Labs (all labs ordered are listed, but only abnormal results are displayed) Labs Reviewed  COMPREHENSIVE METABOLIC PANEL - Abnormal; Notable for the following components:      Result  Value   CO2 20 (*)    Glucose, Bld 167 (*)    All other components within normal limits  CBC WITH DIFFERENTIAL/PLATELET  TROPONIN I (HIGH SENSITIVITY)     EKG  ED ECG REPORT I, Merlyn Lot, the attending physician, personally viewed and interpreted this ECG.   Date: 09/11/2022  EKG Time: 8:29  Rate: 100  Rhythm: afib  Axis: normal  Intervals:normal  ST&T Change: no stemi, non specific st/t wave abn in anterolateral concistent with previous 10/2021    RADIOLOGY Please see ED Course for my review and interpretation.  I personally reviewed all radiographic images ordered to evaluate for the above acute complaints and reviewed radiology reports and findings.  These findings were personally discussed with the patient.  Please see medical record for radiology report.    PROCEDURES:  Critical Care performed: No  Procedures   MEDICATIONS ORDERED IN ED: Medications - No data to display   IMPRESSION / MDM / [place] / ED COURSE  I reviewed the triage vital signs and the nursing notes.  Differential diagnosis includes, but is not limited to, fracture, contusion, SDH, IPH, electrolyte abnormality, anemia  Patient presenting to the ER for evaluation of symptoms as described above.  Based on symptoms, risk factors and considered above differential, this presenting complaint could reflect a potentially life-threatening illness therefore the patient will be placed on continuous pulse oximetry and telemetry for monitoring.  Laboratory evaluation will be sent to evaluate for the above complaints.      Clinical Course as of 09/11/22 1012  Mon Sep 11, 2022  1001 Chest x-ray on my review and interpretation without consolidation or effusion.  CT head with no acute intracranial abnormality. [PR]  1010 Patient reassessed.  Remains well-appearing no acute distress.  Heart rate in the 80s.  Denies any chest pain or pressure.  Do not believe  that further monitoring or repeat labs indicated at this time based on presentation.  Does appear stable appropriate for outpatient follow-up. [PR]    Clinical Course User Index [PR] Merlyn Lot, MD     FINAL CLINICAL IMPRESSION(S) / ED DIAGNOSES   Final diagnoses:  Fall, initial encounter     Rx / DC Orders   ED Discharge Orders     None        Note:  This document was prepared using Dragon voice recognition software and may include unintentional dictation errors.    Merlyn Lot, MD 09/11/22 1012

## 2022-09-11 NOTE — ED Notes (Signed)
Blue top and resp panel sent on arrival.

## 2022-09-11 NOTE — ED Triage Notes (Signed)
Pt to ED via EMS from [place] of falls x2 with pt reports of hitting his head and brief LOC. Pt reports generalized weakness over last 2 days but denies recent illness. Pt is on Aspirin w/ history of stroke and CABG. A&O x4.VS stable.

## 2022-09-13 DIAGNOSIS — R7989 Other specified abnormal findings of blood chemistry: Secondary | ICD-10-CM | POA: Diagnosis not present

## 2022-09-13 DIAGNOSIS — E1151 Type 2 diabetes mellitus with diabetic peripheral angiopathy without gangrene: Secondary | ICD-10-CM | POA: Diagnosis not present

## 2022-09-13 DIAGNOSIS — I252 Old myocardial infarction: Secondary | ICD-10-CM | POA: Diagnosis not present

## 2022-09-13 DIAGNOSIS — G4733 Obstructive sleep apnea (adult) (pediatric): Secondary | ICD-10-CM | POA: Diagnosis not present

## 2022-09-13 DIAGNOSIS — D6869 Other thrombophilia: Secondary | ICD-10-CM | POA: Diagnosis not present

## 2022-09-13 DIAGNOSIS — I152 Hypertension secondary to endocrine disorders: Secondary | ICD-10-CM | POA: Diagnosis not present

## 2022-09-13 DIAGNOSIS — E1159 Type 2 diabetes mellitus with other circulatory complications: Secondary | ICD-10-CM | POA: Diagnosis not present

## 2022-09-13 DIAGNOSIS — E11319 Type 2 diabetes mellitus with unspecified diabetic retinopathy without macular edema: Secondary | ICD-10-CM | POA: Diagnosis not present

## 2022-09-13 DIAGNOSIS — I5032 Chronic diastolic (congestive) heart failure: Secondary | ICD-10-CM | POA: Diagnosis not present

## 2022-09-13 DIAGNOSIS — I10 Essential (primary) hypertension: Secondary | ICD-10-CM | POA: Diagnosis not present

## 2022-09-13 DIAGNOSIS — I2510 Atherosclerotic heart disease of native coronary artery without angina pectoris: Secondary | ICD-10-CM | POA: Diagnosis not present

## 2022-09-13 DIAGNOSIS — E782 Mixed hyperlipidemia: Secondary | ICD-10-CM | POA: Diagnosis not present

## 2022-09-13 DIAGNOSIS — K224 Dyskinesia of esophagus: Secondary | ICD-10-CM | POA: Diagnosis not present

## 2022-09-13 DIAGNOSIS — J4489 Other specified chronic obstructive pulmonary disease: Secondary | ICD-10-CM | POA: Diagnosis not present

## 2022-09-13 DIAGNOSIS — I872 Venous insufficiency (chronic) (peripheral): Secondary | ICD-10-CM | POA: Diagnosis not present

## 2022-09-13 DIAGNOSIS — I509 Heart failure, unspecified: Secondary | ICD-10-CM | POA: Diagnosis not present

## 2022-09-13 DIAGNOSIS — I25810 Atherosclerosis of coronary artery bypass graft(s) without angina pectoris: Secondary | ICD-10-CM | POA: Diagnosis not present

## 2022-09-13 DIAGNOSIS — I700 Atherosclerosis of aorta: Secondary | ICD-10-CM | POA: Diagnosis not present

## 2022-09-13 DIAGNOSIS — M810 Age-related osteoporosis without current pathological fracture: Secondary | ICD-10-CM | POA: Diagnosis not present

## 2022-09-13 DIAGNOSIS — Z23 Encounter for immunization: Secondary | ICD-10-CM | POA: Diagnosis not present

## 2022-09-13 DIAGNOSIS — K219 Gastro-esophageal reflux disease without esophagitis: Secondary | ICD-10-CM | POA: Diagnosis not present

## 2022-09-18 ENCOUNTER — Telehealth: Payer: Self-pay

## 2022-09-18 ENCOUNTER — Emergency Department
Admission: EM | Admit: 2022-09-18 | Discharge: 2022-09-18 | Disposition: A | Discharge status: Skilled Nursing Facility | Payer: 59 | Attending: Emergency Medicine | Admitting: Emergency Medicine

## 2022-09-18 ENCOUNTER — Encounter: Payer: Self-pay | Admitting: Emergency Medicine

## 2022-09-18 ENCOUNTER — Other Ambulatory Visit: Payer: Self-pay

## 2022-09-18 DIAGNOSIS — E1159 Type 2 diabetes mellitus with other circulatory complications: Secondary | ICD-10-CM | POA: Diagnosis not present

## 2022-09-18 DIAGNOSIS — K224 Dyskinesia of esophagus: Secondary | ICD-10-CM | POA: Diagnosis not present

## 2022-09-18 DIAGNOSIS — E1151 Type 2 diabetes mellitus with diabetic peripheral angiopathy without gangrene: Secondary | ICD-10-CM | POA: Diagnosis not present

## 2022-09-18 DIAGNOSIS — D6869 Other thrombophilia: Secondary | ICD-10-CM | POA: Diagnosis not present

## 2022-09-18 DIAGNOSIS — Z743 Need for continuous supervision: Secondary | ICD-10-CM | POA: Diagnosis not present

## 2022-09-18 DIAGNOSIS — I152 Hypertension secondary to endocrine disorders: Secondary | ICD-10-CM | POA: Diagnosis not present

## 2022-09-18 DIAGNOSIS — R509 Fever, unspecified: Secondary | ICD-10-CM | POA: Diagnosis not present

## 2022-09-18 DIAGNOSIS — I252 Old myocardial infarction: Secondary | ICD-10-CM | POA: Diagnosis not present

## 2022-09-18 DIAGNOSIS — I25810 Atherosclerosis of coronary artery bypass graft(s) without angina pectoris: Secondary | ICD-10-CM | POA: Diagnosis not present

## 2022-09-18 DIAGNOSIS — I509 Heart failure, unspecified: Secondary | ICD-10-CM | POA: Diagnosis not present

## 2022-09-18 DIAGNOSIS — W19XXXD Unspecified fall, subsequent encounter: Secondary | ICD-10-CM | POA: Diagnosis not present

## 2022-09-18 DIAGNOSIS — M810 Age-related osteoporosis without current pathological fracture: Secondary | ICD-10-CM | POA: Diagnosis not present

## 2022-09-18 DIAGNOSIS — R5383 Other fatigue: Secondary | ICD-10-CM | POA: Diagnosis not present

## 2022-09-18 DIAGNOSIS — I10 Essential (primary) hypertension: Secondary | ICD-10-CM | POA: Diagnosis not present

## 2022-09-18 DIAGNOSIS — E782 Mixed hyperlipidemia: Secondary | ICD-10-CM | POA: Diagnosis not present

## 2022-09-18 DIAGNOSIS — R339 Retention of urine, unspecified: Secondary | ICD-10-CM | POA: Diagnosis not present

## 2022-09-18 DIAGNOSIS — K219 Gastro-esophageal reflux disease without esophagitis: Secondary | ICD-10-CM | POA: Diagnosis not present

## 2022-09-18 DIAGNOSIS — R350 Frequency of micturition: Secondary | ICD-10-CM

## 2022-09-18 DIAGNOSIS — G4733 Obstructive sleep apnea (adult) (pediatric): Secondary | ICD-10-CM | POA: Diagnosis not present

## 2022-09-18 DIAGNOSIS — J449 Chronic obstructive pulmonary disease, unspecified: Secondary | ICD-10-CM | POA: Diagnosis not present

## 2022-09-18 DIAGNOSIS — J4489 Other specified chronic obstructive pulmonary disease: Secondary | ICD-10-CM | POA: Diagnosis not present

## 2022-09-18 DIAGNOSIS — R7989 Other specified abnormal findings of blood chemistry: Secondary | ICD-10-CM | POA: Diagnosis not present

## 2022-09-18 DIAGNOSIS — R0689 Other abnormalities of breathing: Secondary | ICD-10-CM | POA: Diagnosis not present

## 2022-09-18 DIAGNOSIS — I2510 Atherosclerotic heart disease of native coronary artery without angina pectoris: Secondary | ICD-10-CM | POA: Diagnosis not present

## 2022-09-18 DIAGNOSIS — E11319 Type 2 diabetes mellitus with unspecified diabetic retinopathy without macular edema: Secondary | ICD-10-CM | POA: Diagnosis not present

## 2022-09-18 DIAGNOSIS — I700 Atherosclerosis of aorta: Secondary | ICD-10-CM | POA: Diagnosis not present

## 2022-09-18 DIAGNOSIS — I4891 Unspecified atrial fibrillation: Secondary | ICD-10-CM | POA: Diagnosis not present

## 2022-09-18 DIAGNOSIS — E119 Type 2 diabetes mellitus without complications: Secondary | ICD-10-CM | POA: Diagnosis not present

## 2022-09-18 DIAGNOSIS — Z8673 Personal history of transient ischemic attack (TIA), and cerebral infarction without residual deficits: Secondary | ICD-10-CM | POA: Diagnosis not present

## 2022-09-18 DIAGNOSIS — R531 Weakness: Secondary | ICD-10-CM | POA: Diagnosis not present

## 2022-09-18 DIAGNOSIS — R2681 Unsteadiness on feet: Secondary | ICD-10-CM | POA: Diagnosis not present

## 2022-09-18 DIAGNOSIS — I872 Venous insufficiency (chronic) (peripheral): Secondary | ICD-10-CM | POA: Diagnosis not present

## 2022-09-18 LAB — BASIC METABOLIC PANEL
Anion gap: 14 (ref 5–15)
BUN: 23 mg/dL (ref 8–23)
CO2: 22 mmol/L (ref 22–32)
Calcium: 8.9 mg/dL (ref 8.9–10.3)
Chloride: 97 mmol/L — ABNORMAL LOW (ref 98–111)
Creatinine, Ser: 1.18 mg/dL (ref 0.61–1.24)
GFR, Estimated: >60 mL/min (ref >60)
Glucose, Bld: 144 mg/dL — ABNORMAL HIGH (ref 70–99)
Potassium: 4.1 mmol/L (ref 3.5–5.1)
Sodium: 133 mmol/L — ABNORMAL LOW (ref 135–145)

## 2022-09-18 LAB — CBC
HCT: 41.3 % (ref 39.0–52.0)
Hemoglobin: 13.3 g/dL (ref 13.0–17.0)
MCH: 28.5 pg (ref 26.0–34.0)
MCHC: 32.2 g/dL (ref 30.0–36.0)
MCV: 88.6 fL (ref 80.0–100.0)
Platelets: 361 10*3/uL (ref 150–400)
RBC: 4.66 MIL/uL (ref 4.22–5.81)
RDW: 14.8 % (ref 11.5–15.5)
WBC: 15 10*3/uL — ABNORMAL HIGH (ref 4.0–10.5)
nRBC: 0 % (ref 0.0–0.2)

## 2022-09-18 LAB — URINALYSIS, ROUTINE W REFLEX MICROSCOPIC
Bacteria, UA: NONE SEEN
Bilirubin Urine: NEGATIVE
Glucose, UA: >=500 mg/dL — AB
Hgb urine dipstick: NEGATIVE
Ketones, ur: NEGATIVE mg/dL
Leukocytes,Ua: NEGATIVE
Nitrite: NEGATIVE
Protein, ur: NEGATIVE mg/dL
Specific Gravity, Urine: 1.01 (ref 1.005–1.030)
Squamous Epithelial / HPF: NONE SEEN /HPF (ref 0–5)
pH: 5 (ref 5.0–8.0)

## 2022-09-18 NOTE — ED Notes (Signed)
No answer when called several times from lobby 

## 2022-09-18 NOTE — Discharge Instructions (Signed)
Your bladder scan and lab tests today were all okay.  Please follow-up with your doctor for further evaluation of your symptoms.

## 2022-09-18 NOTE — ED Notes (Signed)
Pt brought to ed rm 18H at this time, this RN now assuming care.

## 2022-09-18 NOTE — ED Provider Notes (Signed)
[facility] Provider Note    Event Date/Time   First MD Initiated Contact with Patient 09/18/22 2025     (approximate)   History   Chief Complaint: Urinary Frequency and Urinary Incontinence   HPI  Evan [name] is a 67 y.o. male with a history of hypertension, diabetes, COPD, obesity, prior stroke who was sent to the ED from [place] due to urinary frequency.  Patient complains of urinating on himself once today.  No abdominal pain or back pain.  No chest pain shortness of breath or cough.  He does endorse fever chills and bodyaches.     Physical Exam   Triage Vital Signs: ED Triage Vitals  Enc Vitals Group     BP 09/18/22 1926 110/74     Pulse Rate 09/18/22 1926 86     Resp 09/18/22 1926 16     Temp 09/18/22 1926 98 F (36.7 C)     Temp Source 09/18/22 1926 Oral     SpO2 09/18/22 1926 99 %     Weight 09/18/22 1927 196 lb 3.4 oz (89 kg)     Height 09/18/22 1927 5' 5"$  (1.651 m)     Head Circumference --      Peak Flow --      Pain Score 09/18/22 1927 0     Pain Loc --      Pain Edu? --      Excl. in [place]? --     Most recent vital signs: Vitals:   09/18/22 1926  BP: 110/74  Pulse: 86  Resp: 16  Temp: 98 F (36.7 C)  SpO2: 99%    General: Awake, no distress.  CV:  Good peripheral perfusion.  Regular rate and rhythm Resp:  Normal effort.  Clear to auscultation bilaterally Abd:  No distention.  Soft nontender Other:  Moist oral mucosa.  No rash.  No lower extremity edema.   ED Results / Procedures / Treatments   Labs (all labs ordered are listed, but only abnormal results are displayed) Labs Reviewed  URINALYSIS, ROUTINE W REFLEX MICROSCOPIC - Abnormal; Notable for the following components:      Result Value   Color, Urine YELLOW (*)    APPearance CLEAR (*)    Glucose, UA >=500 (*)    All other components within normal limits  BASIC METABOLIC PANEL - Abnormal; Notable for the following components:   Sodium 133 (*)     Chloride 97 (*)    Glucose, Bld 144 (*)    All other components within normal limits  CBC - Abnormal; Notable for the following components:   WBC 15.0 (*)    All other components within normal limits     EKG    RADIOLOGY    PROCEDURES:  Procedures   MEDICATIONS ORDERED IN ED: Medications - No data to display   IMPRESSION / MDM / [place] / ED COURSE  I reviewed the triage vital signs and the nursing notes.  DDx: UTI, viral illness, AKI, urinary retention  Patient's presentation is most consistent with acute presentation with potential threat to life or bodily function.  Patient presents with urinary frequency, exam unremarkable.  In the ED, vitals are normal.  Nontoxic.  Labs are all unremarkable except for a mild leukocytosis.  Bladder scan PVR is 0.  Doubt appendicitis or diverticulitis.  Abdomen is nonsurgical and totally benign.  Fever may have been due to viral illness.  He is stable for discharge.  FINAL CLINICAL IMPRESSION(S) / ED DIAGNOSES   Final diagnoses:  Urinary frequency     Rx / DC Orders   ED Discharge Orders     None        Note:  This document was prepared using Dragon voice recognition software and may include unintentional dictation errors.   Carrie Mew, MD 09/18/22 2201

## 2022-09-18 NOTE — Progress Notes (Cosign Needed)
Note marked as in error. Per EPIC patient switched PCP's.

## 2022-09-18 NOTE — ED Triage Notes (Signed)
Pt to ED via EMS from [place] c/o urinary frequency and urinary incontinence.  Patient states urinated on self once today.  States was given medicine at [facility] but unsure what and when.  Pt A&Ox4, chest rise even and unlabored, skin dry, in NAD at this time.

## 2022-09-18 NOTE — ED Notes (Signed)
EMS at bedside to pick up pt. Caretaker verbalized understanding of DC instructions on the phone when report was called.

## 2022-09-18 NOTE — ED Triage Notes (Signed)
Arrives via [facility] from [facility].  C/O unable to void and fever Onset of sympotms today.Marland Kitchen  Has history of UTI, not currently on antibiotics.    Aftib controlled rate. T:  101.4 Vs wnl.

## 2022-09-19 ENCOUNTER — Telehealth: Payer: Self-pay

## 2022-09-19 NOTE — Telephone Encounter (Signed)
        Patient  visited [place] on 2/5   Telephone encounter attempt :  1st  Unable to [employer] [address], [place], [place] [address] Phone: [phone] Email: [email or web address]

## 2022-09-21 DIAGNOSIS — K219 Gastro-esophageal reflux disease without esophagitis: Secondary | ICD-10-CM | POA: Diagnosis not present

## 2022-09-21 DIAGNOSIS — E11319 Type 2 diabetes mellitus with unspecified diabetic retinopathy without macular edema: Secondary | ICD-10-CM | POA: Diagnosis not present

## 2022-09-21 DIAGNOSIS — D6869 Other thrombophilia: Secondary | ICD-10-CM | POA: Diagnosis not present

## 2022-09-21 DIAGNOSIS — R7989 Other specified abnormal findings of blood chemistry: Secondary | ICD-10-CM | POA: Diagnosis not present

## 2022-09-21 DIAGNOSIS — E1159 Type 2 diabetes mellitus with other circulatory complications: Secondary | ICD-10-CM | POA: Diagnosis not present

## 2022-09-21 DIAGNOSIS — E1151 Type 2 diabetes mellitus with diabetic peripheral angiopathy without gangrene: Secondary | ICD-10-CM | POA: Diagnosis not present

## 2022-09-21 DIAGNOSIS — I509 Heart failure, unspecified: Secondary | ICD-10-CM | POA: Diagnosis not present

## 2022-09-21 DIAGNOSIS — I872 Venous insufficiency (chronic) (peripheral): Secondary | ICD-10-CM | POA: Diagnosis not present

## 2022-09-21 DIAGNOSIS — J4489 Other specified chronic obstructive pulmonary disease: Secondary | ICD-10-CM | POA: Diagnosis not present

## 2022-09-21 DIAGNOSIS — I25810 Atherosclerosis of coronary artery bypass graft(s) without angina pectoris: Secondary | ICD-10-CM | POA: Diagnosis not present

## 2022-09-21 DIAGNOSIS — I700 Atherosclerosis of aorta: Secondary | ICD-10-CM | POA: Diagnosis not present

## 2022-09-21 DIAGNOSIS — I252 Old myocardial infarction: Secondary | ICD-10-CM | POA: Diagnosis not present

## 2022-09-21 DIAGNOSIS — E782 Mixed hyperlipidemia: Secondary | ICD-10-CM | POA: Diagnosis not present

## 2022-09-21 DIAGNOSIS — K224 Dyskinesia of esophagus: Secondary | ICD-10-CM | POA: Diagnosis not present

## 2022-09-21 DIAGNOSIS — M810 Age-related osteoporosis without current pathological fracture: Secondary | ICD-10-CM | POA: Diagnosis not present

## 2022-09-21 DIAGNOSIS — I152 Hypertension secondary to endocrine disorders: Secondary | ICD-10-CM | POA: Diagnosis not present

## 2022-09-21 DIAGNOSIS — G4733 Obstructive sleep apnea (adult) (pediatric): Secondary | ICD-10-CM | POA: Diagnosis not present

## 2022-09-21 DIAGNOSIS — I2510 Atherosclerotic heart disease of native coronary artery without angina pectoris: Secondary | ICD-10-CM | POA: Diagnosis not present

## 2022-09-22 DIAGNOSIS — M810 Age-related osteoporosis without current pathological fracture: Secondary | ICD-10-CM | POA: Diagnosis not present

## 2022-09-22 DIAGNOSIS — E11319 Type 2 diabetes mellitus with unspecified diabetic retinopathy without macular edema: Secondary | ICD-10-CM | POA: Diagnosis not present

## 2022-09-22 DIAGNOSIS — I509 Heart failure, unspecified: Secondary | ICD-10-CM | POA: Diagnosis not present

## 2022-09-22 DIAGNOSIS — I152 Hypertension secondary to endocrine disorders: Secondary | ICD-10-CM | POA: Diagnosis not present

## 2022-09-22 DIAGNOSIS — E782 Mixed hyperlipidemia: Secondary | ICD-10-CM | POA: Diagnosis not present

## 2022-09-22 DIAGNOSIS — E1151 Type 2 diabetes mellitus with diabetic peripheral angiopathy without gangrene: Secondary | ICD-10-CM | POA: Diagnosis not present

## 2022-09-22 DIAGNOSIS — G4733 Obstructive sleep apnea (adult) (pediatric): Secondary | ICD-10-CM | POA: Diagnosis not present

## 2022-09-22 DIAGNOSIS — K224 Dyskinesia of esophagus: Secondary | ICD-10-CM | POA: Diagnosis not present

## 2022-09-22 DIAGNOSIS — D6869 Other thrombophilia: Secondary | ICD-10-CM | POA: Diagnosis not present

## 2022-09-22 DIAGNOSIS — I872 Venous insufficiency (chronic) (peripheral): Secondary | ICD-10-CM | POA: Diagnosis not present

## 2022-09-22 DIAGNOSIS — I700 Atherosclerosis of aorta: Secondary | ICD-10-CM | POA: Diagnosis not present

## 2022-09-22 DIAGNOSIS — K219 Gastro-esophageal reflux disease without esophagitis: Secondary | ICD-10-CM | POA: Diagnosis not present

## 2022-09-22 DIAGNOSIS — R7989 Other specified abnormal findings of blood chemistry: Secondary | ICD-10-CM | POA: Diagnosis not present

## 2022-09-22 DIAGNOSIS — E1159 Type 2 diabetes mellitus with other circulatory complications: Secondary | ICD-10-CM | POA: Diagnosis not present

## 2022-09-22 DIAGNOSIS — I2510 Atherosclerotic heart disease of native coronary artery without angina pectoris: Secondary | ICD-10-CM | POA: Diagnosis not present

## 2022-09-22 DIAGNOSIS — I25810 Atherosclerosis of coronary artery bypass graft(s) without angina pectoris: Secondary | ICD-10-CM | POA: Diagnosis not present

## 2022-09-22 DIAGNOSIS — J4489 Other specified chronic obstructive pulmonary disease: Secondary | ICD-10-CM | POA: Diagnosis not present

## 2022-09-22 DIAGNOSIS — I252 Old myocardial infarction: Secondary | ICD-10-CM | POA: Diagnosis not present

## 2022-09-25 DIAGNOSIS — J449 Chronic obstructive pulmonary disease, unspecified: Secondary | ICD-10-CM | POA: Diagnosis not present

## 2022-09-25 DIAGNOSIS — E782 Mixed hyperlipidemia: Secondary | ICD-10-CM | POA: Diagnosis not present

## 2022-09-25 DIAGNOSIS — I739 Peripheral vascular disease, unspecified: Secondary | ICD-10-CM | POA: Diagnosis not present

## 2022-09-25 DIAGNOSIS — I509 Heart failure, unspecified: Secondary | ICD-10-CM | POA: Diagnosis not present

## 2022-09-25 DIAGNOSIS — E11319 Type 2 diabetes mellitus with unspecified diabetic retinopathy without macular edema: Secondary | ICD-10-CM | POA: Diagnosis not present

## 2022-09-25 DIAGNOSIS — I872 Venous insufficiency (chronic) (peripheral): Secondary | ICD-10-CM | POA: Diagnosis not present

## 2022-09-25 DIAGNOSIS — E1151 Type 2 diabetes mellitus with diabetic peripheral angiopathy without gangrene: Secondary | ICD-10-CM | POA: Diagnosis not present

## 2022-09-25 DIAGNOSIS — K224 Dyskinesia of esophagus: Secondary | ICD-10-CM | POA: Diagnosis not present

## 2022-09-25 DIAGNOSIS — I700 Atherosclerosis of aorta: Secondary | ICD-10-CM | POA: Diagnosis not present

## 2022-09-25 DIAGNOSIS — I252 Old myocardial infarction: Secondary | ICD-10-CM | POA: Diagnosis not present

## 2022-09-25 DIAGNOSIS — G4733 Obstructive sleep apnea (adult) (pediatric): Secondary | ICD-10-CM | POA: Diagnosis not present

## 2022-09-25 DIAGNOSIS — D6869 Other thrombophilia: Secondary | ICD-10-CM | POA: Diagnosis not present

## 2022-09-25 DIAGNOSIS — R7989 Other specified abnormal findings of blood chemistry: Secondary | ICD-10-CM | POA: Diagnosis not present

## 2022-09-25 DIAGNOSIS — N182 Chronic kidney disease, stage 2 (mild): Secondary | ICD-10-CM | POA: Diagnosis not present

## 2022-09-25 DIAGNOSIS — E1159 Type 2 diabetes mellitus with other circulatory complications: Secondary | ICD-10-CM | POA: Diagnosis not present

## 2022-09-25 DIAGNOSIS — M810 Age-related osteoporosis without current pathological fracture: Secondary | ICD-10-CM | POA: Diagnosis not present

## 2022-09-25 DIAGNOSIS — Z0189 Encounter for other specified special examinations: Secondary | ICD-10-CM | POA: Diagnosis not present

## 2022-09-25 DIAGNOSIS — I152 Hypertension secondary to endocrine disorders: Secondary | ICD-10-CM | POA: Diagnosis not present

## 2022-09-25 DIAGNOSIS — K219 Gastro-esophageal reflux disease without esophagitis: Secondary | ICD-10-CM | POA: Diagnosis not present

## 2022-09-25 DIAGNOSIS — I130 Hypertensive heart and chronic kidney disease with heart failure and stage 1 through stage 4 chronic kidney disease, or unspecified chronic kidney disease: Secondary | ICD-10-CM | POA: Diagnosis not present

## 2022-09-25 DIAGNOSIS — S41112A Laceration without foreign body of left upper arm, initial encounter: Secondary | ICD-10-CM | POA: Diagnosis not present

## 2022-09-25 DIAGNOSIS — I5022 Chronic systolic (congestive) heart failure: Secondary | ICD-10-CM | POA: Diagnosis not present

## 2022-09-25 DIAGNOSIS — I25810 Atherosclerosis of coronary artery bypass graft(s) without angina pectoris: Secondary | ICD-10-CM | POA: Diagnosis not present

## 2022-09-25 DIAGNOSIS — I2510 Atherosclerotic heart disease of native coronary artery without angina pectoris: Secondary | ICD-10-CM | POA: Diagnosis not present

## 2022-09-25 DIAGNOSIS — J4489 Other specified chronic obstructive pulmonary disease: Secondary | ICD-10-CM | POA: Diagnosis not present

## 2022-09-26 ENCOUNTER — Other Ambulatory Visit: Payer: Self-pay

## 2022-09-26 ENCOUNTER — Emergency Department: Payer: 59

## 2022-09-26 ENCOUNTER — Inpatient Hospital Stay: Payer: 59

## 2022-09-26 ENCOUNTER — Inpatient Hospital Stay
Admission: EM | Admit: 2022-09-26 | Discharge: 2022-10-06 | DRG: 871 | Disposition: A | Discharge status: Skilled Nursing Facility | Payer: 59 | Source: Skilled Nursing Facility | Attending: Hospitalist | Admitting: Hospitalist

## 2022-09-26 DIAGNOSIS — R0689 Other abnormalities of breathing: Secondary | ICD-10-CM | POA: Diagnosis not present

## 2022-09-26 DIAGNOSIS — E11319 Type 2 diabetes mellitus with unspecified diabetic retinopathy without macular edema: Secondary | ICD-10-CM | POA: Diagnosis not present

## 2022-09-26 DIAGNOSIS — I152 Hypertension secondary to endocrine disorders: Secondary | ICD-10-CM | POA: Diagnosis not present

## 2022-09-26 DIAGNOSIS — R579 Shock, unspecified: Secondary | ICD-10-CM

## 2022-09-26 DIAGNOSIS — R6510 Systemic inflammatory response syndrome (SIRS) of non-infectious origin without acute organ dysfunction: Secondary | ICD-10-CM | POA: Diagnosis present

## 2022-09-26 DIAGNOSIS — R571 Hypovolemic shock: Secondary | ICD-10-CM | POA: Diagnosis not present

## 2022-09-26 DIAGNOSIS — F203 Undifferentiated schizophrenia: Secondary | ICD-10-CM | POA: Diagnosis not present

## 2022-09-26 DIAGNOSIS — K760 Fatty (change of) liver, not elsewhere classified: Secondary | ICD-10-CM | POA: Diagnosis not present

## 2022-09-26 DIAGNOSIS — M48061 Spinal stenosis, lumbar region without neurogenic claudication: Secondary | ICD-10-CM | POA: Diagnosis present

## 2022-09-26 DIAGNOSIS — X58XXXS Exposure to other specified factors, sequela: Secondary | ICD-10-CM | POA: Diagnosis present

## 2022-09-26 DIAGNOSIS — E1151 Type 2 diabetes mellitus with diabetic peripheral angiopathy without gangrene: Secondary | ICD-10-CM | POA: Diagnosis not present

## 2022-09-26 DIAGNOSIS — Z951 Presence of aortocoronary bypass graft: Secondary | ICD-10-CM

## 2022-09-26 DIAGNOSIS — Z9989 Dependence on other enabling machines and devices: Secondary | ICD-10-CM

## 2022-09-26 DIAGNOSIS — K8020 Calculus of gallbladder without cholecystitis without obstruction: Secondary | ICD-10-CM | POA: Diagnosis not present

## 2022-09-26 DIAGNOSIS — Z8673 Personal history of transient ischemic attack (TIA), and cerebral infarction without residual deficits: Secondary | ICD-10-CM

## 2022-09-26 DIAGNOSIS — Z83438 Family history of other disorder of lipoprotein metabolism and other lipidemia: Secondary | ICD-10-CM

## 2022-09-26 DIAGNOSIS — F410 Panic disorder [episodic paroxysmal anxiety] without agoraphobia: Secondary | ICD-10-CM | POA: Diagnosis present

## 2022-09-26 DIAGNOSIS — F200 Paranoid schizophrenia: Secondary | ICD-10-CM | POA: Diagnosis present

## 2022-09-26 DIAGNOSIS — N170 Acute kidney failure with tubular necrosis: Secondary | ICD-10-CM | POA: Diagnosis not present

## 2022-09-26 DIAGNOSIS — Z7951 Long term (current) use of inhaled steroids: Secondary | ICD-10-CM

## 2022-09-26 DIAGNOSIS — F4310 Post-traumatic stress disorder, unspecified: Secondary | ICD-10-CM | POA: Diagnosis present

## 2022-09-26 DIAGNOSIS — R42 Dizziness and giddiness: Principal | ICD-10-CM

## 2022-09-26 DIAGNOSIS — E785 Hyperlipidemia, unspecified: Secondary | ICD-10-CM | POA: Diagnosis present

## 2022-09-26 DIAGNOSIS — G259 Extrapyramidal and movement disorder, unspecified: Secondary | ICD-10-CM | POA: Diagnosis not present

## 2022-09-26 DIAGNOSIS — N179 Acute kidney failure, unspecified: Secondary | ICD-10-CM | POA: Diagnosis not present

## 2022-09-26 DIAGNOSIS — F32A Depression, unspecified: Secondary | ICD-10-CM | POA: Diagnosis present

## 2022-09-26 DIAGNOSIS — Z7901 Long term (current) use of anticoagulants: Secondary | ICD-10-CM

## 2022-09-26 DIAGNOSIS — M79604 Pain in right leg: Secondary | ICD-10-CM | POA: Diagnosis present

## 2022-09-26 DIAGNOSIS — I25118 Atherosclerotic heart disease of native coronary artery with other forms of angina pectoris: Secondary | ICD-10-CM | POA: Diagnosis not present

## 2022-09-26 DIAGNOSIS — Z888 Allergy status to other drugs, medicaments and biological substances status: Secondary | ICD-10-CM

## 2022-09-26 DIAGNOSIS — I5032 Chronic diastolic (congestive) heart failure: Secondary | ICD-10-CM | POA: Diagnosis not present

## 2022-09-26 DIAGNOSIS — Z8701 Personal history of pneumonia (recurrent): Secondary | ICD-10-CM

## 2022-09-26 DIAGNOSIS — Z955 Presence of coronary angioplasty implant and graft: Secondary | ICD-10-CM

## 2022-09-26 DIAGNOSIS — E8721 Acute metabolic acidosis: Secondary | ICD-10-CM | POA: Diagnosis present

## 2022-09-26 DIAGNOSIS — I4819 Other persistent atrial fibrillation: Secondary | ICD-10-CM | POA: Diagnosis present

## 2022-09-26 DIAGNOSIS — R200 Anesthesia of skin: Secondary | ICD-10-CM | POA: Diagnosis present

## 2022-09-26 DIAGNOSIS — Z8719 Personal history of other diseases of the digestive system: Secondary | ICD-10-CM

## 2022-09-26 DIAGNOSIS — R7989 Other specified abnormal findings of blood chemistry: Secondary | ICD-10-CM | POA: Diagnosis not present

## 2022-09-26 DIAGNOSIS — I252 Old myocardial infarction: Secondary | ICD-10-CM

## 2022-09-26 DIAGNOSIS — R251 Tremor, unspecified: Secondary | ICD-10-CM | POA: Diagnosis present

## 2022-09-26 DIAGNOSIS — I2510 Atherosclerotic heart disease of native coronary artery without angina pectoris: Secondary | ICD-10-CM | POA: Diagnosis present

## 2022-09-26 DIAGNOSIS — S39012S Strain of muscle, fascia and tendon of lower back, sequela: Secondary | ICD-10-CM

## 2022-09-26 DIAGNOSIS — R6889 Other general symptoms and signs: Secondary | ICD-10-CM | POA: Diagnosis not present

## 2022-09-26 DIAGNOSIS — R0602 Shortness of breath: Secondary | ICD-10-CM | POA: Diagnosis not present

## 2022-09-26 DIAGNOSIS — J449 Chronic obstructive pulmonary disease, unspecified: Secondary | ICD-10-CM | POA: Diagnosis present

## 2022-09-26 DIAGNOSIS — R578 Other shock: Secondary | ICD-10-CM | POA: Diagnosis present

## 2022-09-26 DIAGNOSIS — Z8616 Personal history of COVID-19: Secondary | ICD-10-CM | POA: Diagnosis not present

## 2022-09-26 DIAGNOSIS — E1159 Type 2 diabetes mellitus with other circulatory complications: Secondary | ICD-10-CM | POA: Diagnosis present

## 2022-09-26 DIAGNOSIS — R011 Cardiac murmur, unspecified: Secondary | ICD-10-CM | POA: Diagnosis not present

## 2022-09-26 DIAGNOSIS — Z6841 Body Mass Index (BMI) 40.0 and over, adult: Secondary | ICD-10-CM | POA: Diagnosis not present

## 2022-09-26 DIAGNOSIS — I2489 Other forms of acute ischemic heart disease: Secondary | ICD-10-CM | POA: Diagnosis present

## 2022-09-26 DIAGNOSIS — Z881 Allergy status to other antibiotic agents status: Secondary | ICD-10-CM

## 2022-09-26 DIAGNOSIS — D72829 Elevated white blood cell count, unspecified: Secondary | ICD-10-CM | POA: Diagnosis not present

## 2022-09-26 DIAGNOSIS — E1169 Type 2 diabetes mellitus with other specified complication: Secondary | ICD-10-CM | POA: Diagnosis present

## 2022-09-26 DIAGNOSIS — Z8249 Family history of ischemic heart disease and other diseases of the circulatory system: Secondary | ICD-10-CM

## 2022-09-26 DIAGNOSIS — R4182 Altered mental status, unspecified: Secondary | ICD-10-CM | POA: Diagnosis not present

## 2022-09-26 DIAGNOSIS — R410 Disorientation, unspecified: Secondary | ICD-10-CM | POA: Diagnosis not present

## 2022-09-26 DIAGNOSIS — M79605 Pain in left leg: Secondary | ICD-10-CM | POA: Diagnosis present

## 2022-09-26 DIAGNOSIS — Z880 Allergy status to penicillin: Secondary | ICD-10-CM

## 2022-09-26 DIAGNOSIS — I700 Atherosclerosis of aorta: Secondary | ICD-10-CM | POA: Diagnosis present

## 2022-09-26 DIAGNOSIS — I350 Nonrheumatic aortic (valve) stenosis: Secondary | ICD-10-CM

## 2022-09-26 DIAGNOSIS — Z882 Allergy status to sulfonamides status: Secondary | ICD-10-CM

## 2022-09-26 DIAGNOSIS — I878 Other specified disorders of veins: Secondary | ICD-10-CM | POA: Diagnosis present

## 2022-09-26 DIAGNOSIS — I4581 Long QT syndrome: Secondary | ICD-10-CM | POA: Diagnosis not present

## 2022-09-26 DIAGNOSIS — E876 Hypokalemia: Secondary | ICD-10-CM | POA: Diagnosis not present

## 2022-09-26 DIAGNOSIS — I5189 Other ill-defined heart diseases: Secondary | ICD-10-CM

## 2022-09-26 DIAGNOSIS — K219 Gastro-esophageal reflux disease without esophagitis: Secondary | ICD-10-CM | POA: Diagnosis present

## 2022-09-26 DIAGNOSIS — R404 Transient alteration of awareness: Secondary | ICD-10-CM | POA: Diagnosis not present

## 2022-09-26 DIAGNOSIS — Z79899 Other long term (current) drug therapy: Secondary | ICD-10-CM

## 2022-09-26 DIAGNOSIS — M4716 Other spondylosis with myelopathy, lumbar region: Secondary | ICD-10-CM | POA: Diagnosis not present

## 2022-09-26 DIAGNOSIS — Z1152 Encounter for screening for COVID-19: Secondary | ICD-10-CM

## 2022-09-26 DIAGNOSIS — F423 Hoarding disorder: Secondary | ICD-10-CM | POA: Diagnosis present

## 2022-09-26 DIAGNOSIS — I69398 Other sequelae of cerebral infarction: Secondary | ICD-10-CM | POA: Diagnosis not present

## 2022-09-26 DIAGNOSIS — K224 Dyskinesia of esophagus: Secondary | ICD-10-CM | POA: Diagnosis present

## 2022-09-26 DIAGNOSIS — R350 Frequency of micturition: Secondary | ICD-10-CM

## 2022-09-26 DIAGNOSIS — I499 Cardiac arrhythmia, unspecified: Secondary | ICD-10-CM | POA: Diagnosis not present

## 2022-09-26 DIAGNOSIS — R531 Weakness: Secondary | ICD-10-CM | POA: Diagnosis not present

## 2022-09-26 DIAGNOSIS — J440 Chronic obstructive pulmonary disease with acute lower respiratory infection: Secondary | ICD-10-CM | POA: Diagnosis not present

## 2022-09-26 DIAGNOSIS — Z743 Need for continuous supervision: Secondary | ICD-10-CM | POA: Diagnosis not present

## 2022-09-26 DIAGNOSIS — I509 Heart failure, unspecified: Secondary | ICD-10-CM | POA: Diagnosis not present

## 2022-09-26 DIAGNOSIS — Z7984 Long term (current) use of oral hypoglycemic drugs: Secondary | ICD-10-CM

## 2022-09-26 DIAGNOSIS — I10 Essential (primary) hypertension: Secondary | ICD-10-CM | POA: Diagnosis not present

## 2022-09-26 DIAGNOSIS — G4733 Obstructive sleep apnea (adult) (pediatric): Secondary | ICD-10-CM

## 2022-09-26 DIAGNOSIS — M5106 Intervertebral disc disorders with myelopathy, lumbar region: Secondary | ICD-10-CM | POA: Diagnosis not present

## 2022-09-26 DIAGNOSIS — Z7982 Long term (current) use of aspirin: Secondary | ICD-10-CM

## 2022-09-26 DIAGNOSIS — K429 Umbilical hernia without obstruction or gangrene: Secondary | ICD-10-CM | POA: Diagnosis present

## 2022-09-26 LAB — GLUCOSE, CAPILLARY: Glucose-Capillary: 128 mg/dL — ABNORMAL HIGH (ref 70–99)

## 2022-09-26 LAB — RESP PANEL BY RT-PCR (RSV, FLU A&B, COVID)  RVPGX2
Influenza A by PCR: NEGATIVE
Influenza B by PCR: NEGATIVE
Resp Syncytial Virus by PCR: NEGATIVE
SARS Coronavirus 2 by RT PCR: NEGATIVE

## 2022-09-26 LAB — CBC
HCT: 44.7 % (ref 39.0–52.0)
Hemoglobin: 14.3 g/dL (ref 13.0–17.0)
MCH: 28.4 pg (ref 26.0–34.0)
MCHC: 32 g/dL (ref 30.0–36.0)
MCV: 88.7 fL (ref 80.0–100.0)
Platelets: 512 10*3/uL — ABNORMAL HIGH (ref 150–400)
RBC: 5.04 MIL/uL (ref 4.22–5.81)
RDW: 14.6 % (ref 11.5–15.5)
WBC: 18.5 10*3/uL — ABNORMAL HIGH (ref 4.0–10.5)
nRBC: 0 % (ref 0.0–0.2)

## 2022-09-26 LAB — CBG MONITORING, ED: Glucose-Capillary: 169 mg/dL — ABNORMAL HIGH (ref 70–99)

## 2022-09-26 LAB — BASIC METABOLIC PANEL
Anion gap: 15 (ref 5–15)
BUN: 34 mg/dL — ABNORMAL HIGH (ref 8–23)
CO2: 18 mmol/L — ABNORMAL LOW (ref 22–32)
Calcium: 9.2 mg/dL (ref 8.9–10.3)
Chloride: 104 mmol/L (ref 98–111)
Creatinine, Ser: 1.55 mg/dL — ABNORMAL HIGH (ref 0.61–1.24)
GFR, Estimated: 48 mL/min — ABNORMAL LOW (ref >60)
Glucose, Bld: 151 mg/dL — ABNORMAL HIGH (ref 70–99)
Potassium: 3.7 mmol/L (ref 3.5–5.1)
Sodium: 137 mmol/L (ref 135–145)

## 2022-09-26 LAB — URINALYSIS, W/ REFLEX TO CULTURE (INFECTION SUSPECTED)
Bacteria, UA: NONE SEEN
Bilirubin Urine: NEGATIVE
Glucose, UA: >=500 mg/dL — AB
Ketones, ur: NEGATIVE mg/dL
Leukocytes,Ua: NEGATIVE
Nitrite: NEGATIVE
Protein, ur: 30 mg/dL — AB
RBC / HPF: >50 RBC/hpf (ref 0–5)
Specific Gravity, Urine: 1.013 (ref 1.005–1.030)
Squamous Epithelial / HPF: NONE SEEN /HPF (ref 0–5)
pH: 5 (ref 5.0–8.0)

## 2022-09-26 LAB — LACTIC ACID, PLASMA
Lactic Acid, Venous: 3.3 mmol/L (ref 0.5–1.9)
Lactic Acid, Venous: 3 mmol/L (ref 0.5–1.9)

## 2022-09-26 MED ORDER — SENNA 8.6 MG PO TABS
2.0000 | ORAL_TABLET | Freq: Every day | ORAL | Status: DC
  Administered 2022-09-26 – 2022-10-05 (×10): 17.2 mg via ORAL
  Filled 2022-09-26 (×10): qty 2

## 2022-09-26 MED ORDER — ASPIRIN 81 MG PO TBEC
81.0000 mg | DELAYED_RELEASE_TABLET | Freq: Every day | ORAL | Status: DC
  Administered 2022-09-27 – 2022-10-06 (×10): 81 mg via ORAL
  Filled 2022-09-26 (×10): qty 1

## 2022-09-26 MED ORDER — SENNOSIDES-DOCUSATE SODIUM 8.6-50 MG PO TABS: 1.0000 | ORAL_TABLET | Freq: Every evening | ORAL | Status: DC | PRN

## 2022-09-26 MED ORDER — MENTHOL 3 MG MT LOZG: 1.0000 | LOZENGE | OROMUCOSAL | Status: DC | PRN

## 2022-09-26 MED ORDER — ALBUTEROL SULFATE (2.5 MG/3ML) 0.083% IN NEBU: 2.5000 mg | INHALATION_SOLUTION | Freq: Four times a day (QID) | RESPIRATORY_TRACT | Status: AC | PRN

## 2022-09-26 MED ORDER — VANCOMYCIN HCL IN DEXTROSE 1-5 GM/200ML-% IV SOLN
1000.0000 mg | Freq: Once | INTRAVENOUS | Status: AC
  Administered 2022-09-26: 1000 mg via INTRAVENOUS
  Filled 2022-09-26: qty 200

## 2022-09-26 MED ORDER — ENOXAPARIN SODIUM 60 MG/0.6ML IJ SOSY: 0.5000 mg/kg | PREFILLED_SYRINGE | INTRAMUSCULAR | Status: DC

## 2022-09-26 MED ORDER — HALOPERIDOL 1 MG PO TABS
1.0000 mg | ORAL_TABLET | Freq: Every day | ORAL | Status: DC
  Administered 2022-09-26 – 2022-09-28 (×3): 1 mg via ORAL
  Filled 2022-09-26 (×3): qty 1

## 2022-09-26 MED ORDER — LACTATED RINGERS IV BOLUS
1000.0000 mL | Freq: Once | INTRAVENOUS | Status: AC
  Administered 2022-09-26: 1000 mL via INTRAVENOUS

## 2022-09-26 MED ORDER — HYDRALAZINE HCL 20 MG/ML IJ SOLN: 5.0000 mg | Freq: Three times a day (TID) | INTRAMUSCULAR | Status: DC | PRN

## 2022-09-26 MED ORDER — MOMETASONE FURO-FORMOTEROL FUM 100-5 MCG/ACT IN AERO
2.0000 | INHALATION_SPRAY | Freq: Two times a day (BID) | RESPIRATORY_TRACT | Status: DC
  Administered 2022-09-27 – 2022-10-06 (×19): 2 via RESPIRATORY_TRACT
  Filled 2022-09-26: qty 8.8

## 2022-09-26 MED ORDER — SODIUM CHLORIDE 0.9 % IV SOLN
2.0000 g | Freq: Once | INTRAVENOUS | Status: AC
  Administered 2022-09-26: 2 g via INTRAVENOUS
  Filled 2022-09-26: qty 12.5

## 2022-09-26 MED ORDER — NYSTATIN 100000 UNIT/GM EX CREA: TOPICAL_CREAM | Freq: Two times a day (BID) | CUTANEOUS | Status: DC | PRN

## 2022-09-26 MED ORDER — SODIUM CHLORIDE 0.9 % IV SOLN: INTRAVENOUS | Status: AC

## 2022-09-26 MED ORDER — ACETAMINOPHEN 650 MG RE SUPP: 650.0000 mg | Freq: Four times a day (QID) | RECTAL | Status: DC | PRN

## 2022-09-26 MED ORDER — NITROGLYCERIN 0.4 MG SL SUBL: 0.4000 mg | SUBLINGUAL_TABLET | SUBLINGUAL | Status: DC | PRN

## 2022-09-26 MED ORDER — SIMVASTATIN 20 MG PO TABS
20.0000 mg | ORAL_TABLET | Freq: Every day | ORAL | Status: DC
  Administered 2022-09-26 – 2022-10-05 (×10): 20 mg via ORAL
  Filled 2022-09-26 (×10): qty 1

## 2022-09-26 MED ORDER — MELATONIN 5 MG PO TABS
5.0000 mg | ORAL_TABLET | Freq: Every evening | ORAL | Status: DC | PRN
  Administered 2022-10-01 – 2022-10-04 (×3): 5 mg via ORAL
  Filled 2022-09-26 (×3): qty 1

## 2022-09-26 MED ORDER — RISPERIDONE 0.5 MG PO TABS
0.5000 mg | ORAL_TABLET | Freq: Two times a day (BID) | ORAL | Status: DC
  Administered 2022-09-26 – 2022-09-29 (×6): 0.5 mg via ORAL
  Filled 2022-09-26 (×6): qty 1

## 2022-09-26 MED ORDER — VANCOMYCIN HCL 750 MG/150ML IV SOLN
750.0000 mg | Freq: Once | INTRAVENOUS | Status: AC
  Administered 2022-09-26: 750 mg via INTRAVENOUS
  Filled 2022-09-26: qty 150

## 2022-09-26 MED ORDER — IOHEXOL 300 MG/ML  SOLN
100.0000 mL | Freq: Once | INTRAMUSCULAR | Status: AC | PRN
  Administered 2022-09-26: 100 mL via INTRAVENOUS

## 2022-09-26 MED ORDER — ONDANSETRON HCL 4 MG/2ML IJ SOLN: 4.0000 mg | Freq: Four times a day (QID) | INTRAMUSCULAR | Status: DC | PRN

## 2022-09-26 MED ORDER — INSULIN ASPART 100 UNIT/ML IJ SOLN
0.0000 [IU] | Freq: Three times a day (TID) | INTRAMUSCULAR | Status: DC
  Administered 2022-09-27: 1 [IU] via SUBCUTANEOUS
  Administered 2022-09-28 – 2022-09-29 (×6): 2 [IU] via SUBCUTANEOUS
  Administered 2022-09-30: 1 [IU] via SUBCUTANEOUS
  Administered 2022-09-30: 2 [IU] via SUBCUTANEOUS
  Administered 2022-10-01 (×2): 1 [IU] via SUBCUTANEOUS
  Administered 2022-10-02: 2 [IU] via SUBCUTANEOUS
  Administered 2022-10-04: 3 [IU] via SUBCUTANEOUS
  Administered 2022-10-04 – 2022-10-06 (×4): 1 [IU] via SUBCUTANEOUS
  Filled 2022-09-26 (×18): qty 1

## 2022-09-26 MED ORDER — RIVAROXABAN 20 MG PO TABS
20.0000 mg | ORAL_TABLET | Freq: Every day | ORAL | Status: DC
  Administered 2022-09-27 – 2022-10-05 (×9): 20 mg via ORAL
  Filled 2022-09-26 (×9): qty 1

## 2022-09-26 MED ORDER — SODIUM CHLORIDE 0.9 % IV SOLN
2.0000 g | Freq: Two times a day (BID) | INTRAVENOUS | Status: DC
  Administered 2022-09-27: 2 g via INTRAVENOUS
  Filled 2022-09-26 (×2): qty 12.5

## 2022-09-26 MED ORDER — ONDANSETRON HCL 4 MG PO TABS
4.0000 mg | ORAL_TABLET | Freq: Four times a day (QID) | ORAL | Status: DC | PRN
  Administered 2022-10-02: 4 mg via ORAL
  Filled 2022-09-26: qty 1

## 2022-09-26 MED ORDER — PANTOPRAZOLE SODIUM 40 MG PO TBEC
40.0000 mg | DELAYED_RELEASE_TABLET | Freq: Every day | ORAL | Status: DC
  Administered 2022-09-27 – 2022-10-06 (×10): 40 mg via ORAL
  Filled 2022-09-26 (×10): qty 1

## 2022-09-26 MED ORDER — ACETAMINOPHEN 325 MG PO TABS
650.0000 mg | ORAL_TABLET | Freq: Four times a day (QID) | ORAL | Status: DC | PRN
  Administered 2022-09-30 – 2022-10-06 (×5): 650 mg via ORAL
  Filled 2022-09-26 (×6): qty 2

## 2022-09-26 MED ORDER — INSULIN ASPART 100 UNIT/ML IJ SOLN: 0.0000 [IU] | Freq: Every day | INTRAMUSCULAR | Status: DC

## 2022-09-26 MED ORDER — ALBUTEROL SULFATE HFA 108 (90 BASE) MCG/ACT IN AERS: 2.0000 | INHALATION_SPRAY | Freq: Four times a day (QID) | RESPIRATORY_TRACT | Status: DC | PRN

## 2022-09-26 NOTE — Assessment & Plan Note (Signed)
-   Resumed home risperidone 0.5 mg p.o. twice daily, Haldol 1 mg nightly

## 2022-09-26 NOTE — Assessment & Plan Note (Signed)
-   Resumed home aspirin 81 mg daily, simvastatin 20 mg nightly - Holding home metoprolol succinate 50 mg daily at this time due to possible sepsis/severe sepsis presentation - AM team to resume home antihypertensive medication when appropriate

## 2022-09-26 NOTE — Consult Note (Signed)
Pharmacy Antibiotic Note  Evan [name] is a 67 y.o. male admitted on 09/26/2022 with sepsis.  Pharmacy has been consulted for Vancomycin and Cefepime dosing.  Plan: Vancomycin 1750 mg IV LD in ED, followed by variable dosing.  Scr on 2/12 = 1.18, today 1.55.   Cefepime 2 gm IV Q12H  Height: 5' 5"$  (165.1 cm) Weight: 89 kg (196 lb 3.4 oz) IBW/kg (Calculated) : 61.5  Temp (24hrs), Avg:98.4 F (36.9 C), Min:98.3 F (36.8 C), Max:98.4 F (36.9 C)  Recent Labs  Lab 09/26/22 1010 09/26/22 1117 09/26/22 1301  WBC 18.5*  --   --   CREATININE 1.55*  --   --   LATICACIDVEN  --  3.3* 3.0*    Estimated Creatinine Clearance: 46.8 mL/min (A) (by C-G formula based on SCr of 1.55 mg/dL (H)).    Allergies  Allergen Reactions   Penicillin G Hives   Sulfa Antibiotics Hives   Tiotropium    Zoloft [Sertraline Hcl] Other (See Comments)    Antimicrobials this admission: Vancomycin 2/20 >>  Cefepime 2/20 >>   Dose adjustments this admission: N/A  Microbiology results: 2/20 BCx: pending 2/20 C diff: ordered  2/20 GI panel: ordered   Thank you for allowing pharmacy to be a part of this patient's care.  Alison Murray 09/26/2022 4:34 PM

## 2022-09-26 NOTE — Assessment & Plan Note (Signed)
-   Not in acute exacerbation at this time - Albuterol nebulizer, every 6 hours as needed for wheezing and shortness of breath, 5 days ordered

## 2022-09-26 NOTE — ED Provider Notes (Signed)
[facility] Provider Note    Event Date/Time   First MD Initiated Contact with Patient 09/26/22 0945     (approximate)   History   Dizziness   HPI  Evan WALDROUP is a 67 y.o. male who presents to the emergency department today with complaints of dizziness.  He also complains of frequent urination.  Patient states that the urination issue has been on and off for a while.  He denies any painful urination.  Denies any bad odor to his urine.  The dizziness started today.  He noticed it when he got up. The patient denies any chest pain.  Per report, the patient also has been having diarrhea recently although did not complain of that to myself.     Physical Exam   Triage Vital Signs: ED Triage Vitals [09/26/22 0945]  Enc Vitals Group     BP 101/72     Pulse 91     Resp 18     Temp 98.4     Temp src      SpO2 95     Weight 196 lb 3.4 oz (89 kg)     Height 5' 5"$  (1.651 m)     Head Circumference      Peak Flow      Pain Score 0     Pain Loc      Pain Edu?      Excl. in [place]?     Most recent vital signs: Vitals:   09/26/22 1346 09/26/22 1400  BP:  101/72  Pulse:  91  Resp:  18  Temp: 98.4 F (36.9 C)   SpO2:  95%     General: Awake, alert, not completely oriented. CV:  Good peripheral perfusion. Regular rate and rhythm. Resp:  Normal effort. Lungs clear. Abd:  No distention. Non tender.  ED Results / Procedures / Treatments   Labs (all labs ordered are listed, but only abnormal results are displayed) Labs Reviewed  BASIC METABOLIC PANEL - Abnormal; Notable for the following components:      Result Value   CO2 18 (*)    Glucose, Bld 151 (*)    BUN 34 (*)    Creatinine, Ser 1.55 (*)    GFR, Estimated 48 (*)    All other components within normal limits  CBC - Abnormal; Notable for the following components:   WBC 18.5 (*)    Platelets 512 (*)    All other components within normal limits  LACTIC ACID, PLASMA - Abnormal; Notable for  the following components:   Lactic Acid, Venous 3.3 (*)    All other components within normal limits  LACTIC ACID, PLASMA - Abnormal; Notable for the following components:   Lactic Acid, Venous 3.0 (*)    All other components within normal limits  URINALYSIS, W/ REFLEX TO CULTURE (INFECTION SUSPECTED) - Abnormal; Notable for the following components:   Color, Urine YELLOW (*)    APPearance HAZY (*)    Glucose, UA >=500 (*)    Hgb urine dipstick LARGE (*)    Protein, ur 30 (*)    All other components within normal limits  CBG MONITORING, ED - Abnormal; Notable for the following components:   Glucose-Capillary 169 (*)    All other components within normal limits  RESP PANEL BY RT-PCR (RSV, FLU A&B, COVID)  RVPGX2  CULTURE, BLOOD (ROUTINE X 2)  CULTURE, BLOOD (ROUTINE X 2)  HIV ANTIBODY (ROUTINE TESTING W REFLEX)  PROCALCITONIN  EKG  I, Nance Pear, attending physician, personally viewed and interpreted this EKG  EKG Time: 0946 Rate: 126 Rhythm: atrial fibrillation Axis: normal Intervals: qtc 556 QRS: nonspecific IVCD ST changes: no st elevation Impression: abnormal ekg    RADIOLOGY I independently interpreted and visualized the CT abd/pel. My interpretation: No free air. No free fluid Radiology interpretation:  IMPRESSION:  1. Cholelithiasis without evidence of acute cholecystitis.  2. Fat containing umbilical hernia.  3. Otherwise stable appearance of the abdomen and pelvis without  acute finding.  4.  Aortic Atherosclerosis (ICD10-I70.0).    I independently interpreted and visualized the CXR. My interpretation: No pneumonia Radiology interpretation:  IMPRESSION:  No active disease.      PROCEDURES:  Critical Care performed: Yes  Procedures  CRITICAL CARE Performed by: Nance Pear   Total critical care time: 35 minutes  Critical care time was exclusive of separately billable procedures and treating other patients.  Critical care was  necessary to treat or prevent imminent or life-threatening deterioration.  Critical care was time spent personally by me on the following activities: development of treatment plan with patient and/or surrogate as well as nursing, discussions with consultants, evaluation of patient's response to treatment, examination of patient, obtaining history from patient or surrogate, ordering and performing treatments and interventions, ordering and review of laboratory studies, ordering and review of radiographic studies, pulse oximetry and re-evaluation of patient's condition.    MEDICATIONS ORDERED IN ED: Medications - No data to display   IMPRESSION / MDM / [place] / ED COURSE  I reviewed the triage vital signs and the nursing notes.                              Differential diagnosis includes, but is not limited to, pneumonia, UTI, covid  Patient's presentation is most consistent with acute presentation with potential threat to life or bodily function.  The patient is on the cardiac monitor to evaluate for evidence of arrhythmia and/or significant heart rate changes.  Patient presented to the emergency department today with primary concerns for dizziness.  Also had concerns for urinary frequency.  Patient's initial vital signs here with some slight hypotension.  Afebrile.  Blood work with elevated white count and lactic acidosis.  Did have concern for infection.  Patient was started on broad-spectrum antibiotics.  Was given IV fluids.  Chest x-ray without pneumonia.  UA not consistent with infection.  I 2did obtain a CT abdomen pelvis given reports of diarrhea although patient was nontender.  This also did not show any clear etiology of the patient's possible infection.  Discussed with Dr. Tobie Poet with the hospitalist service who will plan on admission.     FINAL CLINICAL IMPRESSION(S) / ED DIAGNOSES   Final diagnoses:  Dizziness  Urinary frequency  Elevated lactic acid level      Note:  This document was prepared using Dragon voice recognition software and may include unintentional dictation errors.    Nance Pear, MD 09/26/22 [phone]

## 2022-09-26 NOTE — H&P (Signed)
History and Physical   Evan [name] [id number] DOB: 06/28/1956 DOA: 09/26/2022  PCP: [name], FNP  Patient coming from: [facility] via EMS  I have personally briefly reviewed patient's old medical records in [place].  Chief Concern: dizziness, found on floor at [facility]  HPI: [name] is a 67 year old male with history of hyperlipidemia, paranoid schizophrenia, CAD, non-insulin-dependent diabetes mellitus, hypertension, obesity, diabetic retinopathy, who presents emergency department for chief concerns of dizziness from [place] via EMS.  Initial vitals in the ED showed temperature 98.3, respiration rate 21, heart rate of 120, blood pressure 101/72, SpO2 94% on room air.  Serum sodium is 137, potassium 3.7, chloride 104, bicarb 18, BUN of 34, serum creatinine 1.55, EGFR 48, nonfasting blood glucose 151, WBC 18.5, hemoglobin 14.3, platelets of 512.  Lactic acid was 3.3 and on repeat was 3.0.  COVID/influenza A/influenza B/RSV PCR were negative.  UA was negative for leukocytes and nitrates. Blood cultures x 2 have been ordered and are in process.  CT abdomen pelvis with contrast: Was read as cholelithiasis without evidence of acute cholecystitis.  Fat-containing umbilical hernia.  Otherwise stable appearance of the abdomen pelvis without acute finding.  Aortic atherosclerosis.  ED treatment: Cefepime and vancomycin per pharmacy.  LR 1 L bolus. -------------------------- At bedside, he is able to tell me his name, age, current location of [place], currrent year of 2024.  He appears lethargic with dry mucosa.  He reports he passed out this morning on his way to the bathroom. He does not know what happened. He denies feeling dizzy.  He denies chest pain, shortness of breath, nausea, vomiting, abodminal pain, truama to his person.   He endorses generalized weakness that started several days ago. He endorses appropriate appetite.    Social history: Patient is from nursing facility.  He denies tobacco, EtOH, recreational drug use.  ROS: Constitutional: no weight change, no fever ENT/Mouth: no sore throat, no rhinorrhea Eyes: no eye pain, no vision changes Cardiovascular: no chest pain, no dyspnea,  no edema, no palpitations Respiratory: no cough, no sputum, no wheezing Gastrointestinal: no nausea, no vomiting, no diarrhea, no constipation Genitourinary: no urinary incontinence, no dysuria, no hematuria Musculoskeletal: no arthralgias, no myalgias Skin: no skin lesions, no pruritus, Neuro: + weakness, + loss of consciousness, no syncope Psych: no anxiety, no depression, + decrease appetite Heme/Lymph: no bruising, no bleeding  ED Course: Discussed with emergency medicine provider, patient requiring hospitalization for chief concerns of SIRS/possible sepsis.  Assessment/Plan  Principal Problem:   SIRS (systemic inflammatory response syndrome) (HCC) Active Problems:   Hypertension associated with diabetes ([place])   Esophageal dysmotility   Paranoid schizophrenia ([name])   Type 2 diabetes mellitus with morbid obesity (HCC)   Hyperlipidemia associated with type 2 diabetes mellitus (HCC)   CAD (coronary artery disease)   Hoarding disorder   COPD, mild (HCC)   Atherosclerosis of aorta (HCC)   History of CVA (cerebrovascular accident)   Leukocytosis   AKI (acute kidney injury) ([name])   Assessment and Plan:  * SIRS (systemic inflammatory response syndrome) (HCC) Altered mental status, dizziness Etiology workup in progress Patient meets criteria for severe sepsis with organ involvement, renal  - Patient had increased heart rate, increased respiration rate, leukocytosis, lactic acid elevated at 3.3 and on repeat was 3.0 - Patient is status post cefepime, vancomycin per pharmacy and LR 1 L bolus - Patient is maintaining appropriate MAP - Additional LR 1 L bolus ordered - Sodium  chloride at 150 mL/h, 1 day  ordered - Continue with cefepime and vancomycin per pharmacy - AM team to follow-up with blood cultures x 2 ordered - Admit to progressive cardiac, inpatient  AKI (acute kidney injury) ([place]) - Etiology workup in progress, differentials include intrarenal in setting of sepsis with organ involvement - Additional LR 1 L bolus ordered - Sodium chloride IVF at 150 mL/h, 1 day ordered - Strict I's and O's - Repeat BMP in the a.m.  Leukocytosis - Etiology workup in progress, treat per above - CBC in a.m.  History of CVA (cerebrovascular accident) - Resumed home aspirin 81 mg daily, simvastatin 20 mg nightly - Holding home metoprolol succinate 50 mg daily at this time due to possible sepsis/severe sepsis presentation - AM team to resume home antihypertensive medication when appropriate  COPD, mild ([name]) - Not in acute exacerbation at this time - Albuterol nebulizer, every 6 hours as needed for wheezing and shortness of breath, 5 days ordered  Hyperlipidemia associated with type 2 diabetes mellitus (HCC) - Simvastatin 20 mg nightly resumed  Paranoid schizophrenia ([name]) - Resumed home risperidone 0.5 mg p.o. twice daily, Haldol 1 mg nightly  Hypertension associated with diabetes ([name]) - AM team to resume home metoprolol succinate 50 mg daily when the benefits outweigh the risks - Hydralazine 5 mg IV every 8 hours as needed for SBP > 180, 4 days ordered  Chart reviewed.   DVT prophylaxis:  Code Status: full code Diet: Heart healthy/carb modified Family Communication: a phone call was offered, patient declined.  Disposition Plan: Clinical course Consults called: None at this time Admission status: Progressive cardiac, inpatient  Past Medical History:  Diagnosis Date   Anginal pain ([name])    Anxiety disorder    Asthma    Atresia of esophagus without fistula    CAD (coronary artery disease)    Cellulitis    CHF (congestive heart failure) (HCC)    NYHA CLASS  III,CHRONIC,DIASTOLIC   COPD (chronic obstructive pulmonary disease) ([name])    COVID-19    Diabetes mellitus without complication (HCC)    Edema    RIGHT LOWER LEG   Gastroesophageal reflux    H/O: GI bleed    History of pneumonia    Remote   History of scarlet fever    Childhood   Hyperlipidemia    Hypertension    Myocardial infarction ([place]) 2009   Obesity    Obstructive sleep apnea    Pain    CHRONIC BACK / ABDOMINAL   Panic disorder    Peripheral venous insufficiency    PTSD (post-traumatic stress disorder)    Retinopathy    DIABETIC   Stasis, venous    Stroke [facility])    Vertigo    Past Surgical History:  Procedure Laterality Date   CARDIAC CATHETERIZATION     CATARACT EXTRACTION Left    CATARACT EXTRACTION W/PHACO Right 05/03/2017   Procedure: CATARACT EXTRACTION PHACO AND INTRAOCULAR LENS PLACEMENT (IOC);  Surgeon: Leandrew Koyanagi, MD;  Location: [facility] ORS;  Service: Ophthalmology;  Laterality: Right;  [place] 00:35.3 AP% 12.3 CDE 4.33 Fluid Pack lot # [id number]        COLONOSCOPY WITH PROPOFOL N/A 11/26/2020   Procedure: COLONOSCOPY WITH PROPOFOL;  Surgeon: Jonathon Bellows, MD;  Location: [facility] ENDOSCOPY;  Service: Gastroenterology;  Laterality: N/A;  ANNETTE TO PICK UP [phone]   CORONARY ANGIOPLASTY WITH STENT PLACEMENT  2002   CORONARY ANGIOPLASTY WITH STENT PLACEMENT  1999   CORONARY ARTERY BYPASS GRAFT  x7   ESOPHAGOGASTRODUODENOSCOPY N/A 09/19/2016   Procedure: ESOPHAGOGASTRODUODENOSCOPY (EGD);  Surgeon: Lollie Sails, MD;  Location: [facility] ENDOSCOPY;  Service: Endoscopy;  Laterality: N/A;   ESOPHAGOGASTRODUODENOSCOPY (EGD) WITH PROPOFOL  11/26/2020   Procedure: ESOPHAGOGASTRODUODENOSCOPY (EGD) WITH PROPOFOL;  Surgeon: Jonathon Bellows, MD;  Location: [facility] ENDOSCOPY;  Service: Gastroenterology;;   Social History:  reports that he has never smoked. He has never used smokeless tobacco. He reports that he does not drink alcohol and does not use drugs.  Allergies   Allergen Reactions   Penicillin G Hives   Sulfa Antibiotics Hives   Tiotropium    Zoloft [Sertraline Hcl] Other (See Comments)   Family History  Problem Relation Age of Onset   Heart attack Mother    Hypertension Mother    Hyperlipidemia Mother    Heart attack Brother 64       MI   Coronary artery disease Other    Family history: Family history reviewed and not pertinent.  Prior to Admission medications   Medication Sig Start Date End Date Taking? Authorizing Provider  albuterol (VENTOLIN HFA) 108 (90 Base) MCG/ACT inhaler Inhale 2 puffs into the lungs every 6 (six) hours as needed for wheezing or shortness of breath. 12/16/21   Clapacs, Madie Reno, MD  aspirin EC 81 MG EC tablet Take 1 tablet (81 mg total) by mouth daily. Swallow whole. 12/17/21   Clapacs, Madie Reno, MD  busPIRone (BUSPAR) 5 MG tablet Take 1 tablet (5 mg total) by mouth 2 (two) times daily. 12/16/21   Clapacs, Madie Reno, MD  D-5000 125 MCG (5000 UT) TABS Take 1 tablet by mouth daily. 01/09/22   [provider]  empagliflozin (JARDIANCE) 10 MG TABS tablet Take 1 tablet (10 mg total) by mouth daily. 12/16/21   Clapacs, Madie Reno, MD  ezetimibe (ZETIA) 10 MG tablet Take 1 tablet (10 mg total) by mouth daily. 12/16/21   Clapacs, Madie Reno, MD  furosemide (LASIX) 40 MG tablet TAKE 2 TABLETS BY MOUTH ONCE EVERY MORNING AND 1 TABLET ONCE EVERY EVENING 12/16/21   Clapacs, Madie Reno, MD  haloperidol (HALDOL) 1 MG tablet Take 1 mg by mouth at bedtime. 01/30/22   [provider]  metoprolol succinate (TOPROL-XL) 50 MG 24 hr tablet Take 1 tablet (50 mg total) by mouth daily. 12/17/21   Clapacs, Madie Reno, MD  nitroGLYCERIN (NITROSTAT) 0.4 MG SL tablet Place 1 tablet (0.4 mg total) under the tongue every 5 (five) minutes as needed for chest pain. 12/16/21   Clapacs, Madie Reno, MD  olmesartan (BENICAR) 5 MG tablet Take 1 tablet (5 mg total) by mouth daily. 12/16/21   Clapacs, Madie Reno, MD  pantoprazole (PROTONIX) 40 MG tablet Take 1 tablet (40 mg  total) by mouth daily. 12/16/21   Clapacs, Madie Reno, MD  psyllium (HYDROCIL/METAMUCIL) 95 % PACK Take 1 packet by mouth daily. 12/17/21   Clapacs, Madie Reno, MD  risperiDONE (RISPERDAL) 0.5 MG tablet Take 1 tablet (0.5 mg total) by mouth 2 (two) times daily. 12/16/21   Clapacs, Madie Reno, MD  rivaroxaban (XARELTO) 20 MG TABS tablet Take 1 tablet (20 mg total) by mouth daily with supper. 12/16/21   Clapacs, Madie Reno, MD  rosuvastatin (CRESTOR) 5 MG tablet Take 1 tablet (5 mg total) by mouth daily. 12/17/21   Clapacs, Madie Reno, MD  Saccharomyces boulardii (PROBIOTIC) 250 MG CAPS TAKE 1 CAPSULE BY MOUTH EVERY MORNING AND AT BEDTIME 11/22/21   [name], [name] T, NP  Sennosides (SENNA) 8.6 MG  CAPS Take 2 capsules by mouth daily. 01/30/22   [provider]  SYMBICORT 80-4.5 MCG/ACT inhaler INHALE 2 PUFFS BY MOUTH 2 TIMES DAILY. RINSE MOUTH AFTER USE. 11/17/21   [name] T, NP  vitamin B-12 (CYANOCOBALAMIN) 1000 MCG tablet Take 1 tablet (1,000 mcg total) by mouth daily. 12/16/21   Clapacs, Madie Reno, MD   Physical Exam: Vitals:   09/26/22 1248 09/26/22 1346 09/26/22 1400 09/26/22 1700  BP: 92/65  101/72 105/71  Pulse: 100  91 96  Resp: 18  18 16  $ Temp:  98.4 F (36.9 C)  97.6 F (36.4 C)  TempSrc:  Oral  Oral  SpO2: 96%  95% 100%  Weight:      Height:       Constitutional: appears age appropriate, frail, weak, NAD, calm, comfortable Eyes: PERRL, lids and conjunctivae normal ENMT: Mucous membranes are dry. Posterior pharynx clear of any exudate or lesions. Age-appropriate dentition. Hearing appropriate. Bilateral nares with dried blood Neck: normal, supple, no masses, no thyromegaly Respiratory: clear to auscultation bilaterally, no wheezing, no crackles. Normal respiratory effort. No accessory muscle use.  Cardiovascular: Regular rate and rhythm, no murmurs / rubs / gallops. No extremity edema. 2+ pedal pulses. No carotid bruits.  Abdomen: obese abdomen, no tenderness, no masses palpated, no  hepatosplenomegaly. Bowel sounds positive.  Musculoskeletal: no clubbing / cyanosis. No joint deformity upper and lower extremities. Good ROM, no contractures, no atrophy. Normal muscle tone.  Skin: no rashes, lesions, ulcers. No induration Neurologic: Sensation intact. Strength 5/5 in all 4.  Psychiatric: Blunted judgment and insight. Alert and oriented x 3. Depresed mood.   EKG: independently reviewed, showing atrial fibrillation with rate of 126, QTc 556  Chest x-ray on Admission: I personally reviewed and I agree with radiologist reading as below.  CT ABDOMEN PELVIS W CONTRAST  Result Date: 09/26/2022 CLINICAL DATA:  Dizziness, weakness, diarrhea, found down EXAM: CT ABDOMEN AND PELVIS WITH CONTRAST TECHNIQUE: Multidetector CT imaging of the abdomen and pelvis was performed using the standard protocol following bolus administration of intravenous contrast. RADIATION DOSE REDUCTION: This exam was performed according to the departmental dose-optimization program which includes automated exposure control, adjustment of the mA and/or kV according to patient size and/or use of iterative reconstruction technique. CONTRAST:  186m OMNIPAQUE IOHEXOL 300 MG/ML  SOLN COMPARISON:  08/31/2021 FINDINGS: Lower chest: No acute pleural or parenchymal lung disease. Hepatobiliary: Hepatic steatosis. No focal liver abnormality. Cholelithiasis without cholecystitis. Pancreas: Unremarkable. No pancreatic ductal dilatation or surrounding inflammatory changes. Spleen: Normal in size without focal abnormality. Adrenals/Urinary Tract: Stable appearance of the bilateral kidneys. No urinary tract calculi or obstructive uropathy. Bladder is moderately distended without filling defect or focal abnormality. The adrenals are stable. Stomach/Bowel: No bowel obstruction or ileus. No bowel wall thickening or inflammatory change. Vascular/Lymphatic: Aortic atherosclerosis. No enlarged abdominal or pelvic lymph nodes. Reproductive:  Prostate is unremarkable. Other: No free fluid or free intraperitoneal gas. Fat containing umbilical hernia unchanged. No bowel herniation. Musculoskeletal: No acute or destructive bony lesions. Reconstructed images demonstrate no additional findings. IMPRESSION: 1. Cholelithiasis without evidence of acute cholecystitis. 2. Fat containing umbilical hernia. 3. Otherwise stable appearance of the abdomen and pelvis without acute finding. 4.  Aortic Atherosclerosis (ICD10-I70.0). Electronically Signed   By: MRanda NgoM.D.   On: 09/26/2022 15:05   DG Chest Portable 1 View  Result Date: 09/26/2022 CLINICAL DATA:  Weakness. EXAM: PORTABLE CHEST 1 VIEW COMPARISON:  Chest x-ray February 5, 24. FINDINGS: Chronically elevated right hemidiaphragm. No  definite consolidation. No visible pleural effusions or pneumothorax. No evidence of acute osseous abnormality. Similar cardiomediastinal silhouette. CABG and median sternotomy. IMPRESSION: No active disease. Electronically Signed   By: Margaretha Sheffield M.D.   On: 09/26/2022 12:44    Labs on Admission: I have personally reviewed following labs  CBC: Recent Labs  Lab 09/26/22 1010  WBC 18.5*  HGB 14.3  HCT 44.7  MCV 88.7  PLT [id number]*   Basic Metabolic Panel: Recent Labs  Lab 09/26/22 1010  NA 137  K 3.7  CL 104  CO2 18*  GLUCOSE 151*  BUN 34*  CREATININE 1.55*  CALCIUM 9.2   GFR: Estimated Creatinine Clearance: 46.8 mL/min (A) (by C-G formula based on SCr of 1.55 mg/dL (H)).  CBG: Recent Labs  Lab 09/26/22 1001  GLUCAP 169*   Urine analysis:    Component Value Date/Time   COLORURINE YELLOW (A) 09/26/2022 1117   APPEARANCEUR HAZY (A) 09/26/2022 1117   APPEARANCEUR Clear 09/20/2021 0856   LABSPEC 1.013 09/26/2022 1117   LABSPEC 1.013 11/29/2014 1120   PHURINE 5.0 09/26/2022 1117   GLUCOSEU >=500 (A) 09/26/2022 1117   GLUCOSEU Negative 11/29/2014 1120   HGBUR LARGE (A) 09/26/2022 1117   BILIRUBINUR NEGATIVE 09/26/2022 1117    BILIRUBINUR Negative 09/20/2021 0856   BILIRUBINUR Negative 11/29/2014 1120   KETONESUR NEGATIVE 09/26/2022 1117   PROTEINUR 30 (A) 09/26/2022 1117   NITRITE NEGATIVE 09/26/2022 1117   LEUKOCYTESUR NEGATIVE 09/26/2022 1117   LEUKOCYTESUR Negative 11/29/2014 1120   CRITICAL CARE Performed by: Dr. Tobie Poet  Total critical care time: 35 minutes  Critical care time was exclusive of separately billable procedures and treating other patients.  Critical care was necessary to treat or prevent imminent or life-threatening deterioration.  Critical care was time spent personally by me on the following activities: development of treatment plan with patient and/or surrogate as well as nursing, discussions with consultants, evaluation of patient's response to treatment, examination of patient, obtaining history from patient or surrogate, ordering and performing treatments and interventions, ordering and review of laboratory studies, ordering and review of radiographic studies, pulse oximetry and re-evaluation of patient's condition.  This document was prepared using Dragon Voice Recognition software and may include unintentional dictation errors.  Dr. Tobie Poet [facility]  If 7PM-7AM, please contact overnight-coverage provider If 7AM-7PM, please contact day coverage provider [email or web address]  09/26/2022, 7:54 PM

## 2022-09-26 NOTE — ED Notes (Signed)
Patient transported to CT 

## 2022-09-26 NOTE — Assessment & Plan Note (Addendum)
Altered mental status, dizziness Etiology workup in progress Patient meets criteria for severe sepsis with organ involvement, renal  - Patient had increased heart rate, increased respiration rate, leukocytosis, lactic acid elevated at 3.3 and on repeat was 3.0 - Patient is status post cefepime, vancomycin per pharmacy and LR 1 L bolus - Patient is maintaining appropriate MAP - Additional LR 1 L bolus ordered - Sodium chloride at 150 mL/h, 1 day ordered - Continue with cefepime and vancomycin per pharmacy - AM team to follow-up with blood cultures x 2 ordered - Admit to progressive cardiac, inpatient

## 2022-09-26 NOTE — Assessment & Plan Note (Signed)
-   Etiology workup in progress, treat per above - CBC in a.m.

## 2022-09-26 NOTE — ED Triage Notes (Signed)
Pt comes in via [facility] from [facility] with complaints of dizziness. According EMS staff said they found him on the floor in his room. Pt states that he was headed to the bathroom this morning, when he felt dizzy. Pt said he then just laid down on the floor, because he didn't want to fall down. Pt states he has had some recent diarrhea, and has been feeling generalized weakness over the past week or so. Pt is alert and oriented to person and place and situation, but not to time. Pt has a 18 in the left forearm, and received 258m of normal saline. Pt has a history of afib, and schizophrenia. Pt with no obvious signs of distress at this time.   EMS Vital signs: 99.3 T 87/56 BP 194 Blood glucose 130 HR CO2 23 28 RR

## 2022-09-26 NOTE — Assessment & Plan Note (Addendum)
-   AM team to resume home metoprolol succinate 50 mg daily when the benefits outweigh the risks - Hydralazine 5 mg IV every 8 hours as needed for SBP > 180, 4 days ordered

## 2022-09-26 NOTE — Progress Notes (Signed)
PHARMACIST - PHYSICIAN COMMUNICATION  CONCERNING:  Enoxaparin (Lovenox) for DVT Prophylaxis    RECOMMENDATION: Patient was prescribed enoxaprin 56m q24 hours for VTE prophylaxis.   Filed Weights   09/26/22 0945  Weight: 89 kg (196 lb 3.4 oz)    Body mass index is 32.65 kg/m.  Estimated Creatinine Clearance: 46.8 mL/min (A) (by C-G formula based on SCr of 1.55 mg/dL (H)).   Based on CChaunceypatient is candidate for enoxaparin 0.530mkg TBW SQ every 24 hours based on BMI being >30.  DESCRIPTION: Pharm[facility]  Patient is now receiving enoxaparin 45 mg every 24 hours    HoLorin PicketPharmD Clinical Pharmacist  09/26/2022 3:40 PM

## 2022-09-26 NOTE — Assessment & Plan Note (Signed)
Simvastatin 20 mg nightly resumed 

## 2022-09-26 NOTE — Assessment & Plan Note (Addendum)
-   Etiology workup in progress, differentials include intrarenal in setting of sepsis with organ involvement - Additional LR 1 L bolus ordered - Sodium chloride IVF at 150 mL/h, 1 day ordered - Strict I's and O's - Repeat BMP in the a.m.

## 2022-09-26 NOTE — Hospital Course (Addendum)
Evan [name] is a 67 year old male with history of hyperlipidemia, paranoid schizophrenia, CAD, non-insulin-dependent diabetes mellitus, hypertension, obesity, diabetic retinopathy, who presents emergency department for chief concerns of dizziness from [place] via EMS. 02/20: afebrile, RR 21, tachycardic at HR 120, blood pressure 101/72, SpO2 94% on room air. Serum creatinine 1.55, EGFR 48, WBC 18.5, hemoglobin 14.3, platelets 512, Lactic acid was 3.3 and on repeat was 3.0. COVID/influenza A/influenza B/RSV PCR were negative. UA was negative for leukocytes and nitrates. Blood cultures x 2 have been ordered and are in process.CT abdomen pelvis with contrast: Was read as cholelithiasis without evidence of acute cholecystitis.  Fat-containing umbilical hernia.  Otherwise stable appearance of the abdomen pelvis without acute finding.  Aortic atherosclerosis. ED treatment: Cefepime and vancomycin per pharmacy.  LR 1 L bolus. Admitted to hospitalist service for sepsis unknown source. 02/21: hypotensive overnight, responsive to fluid bolus, BP still low in AM, started midodrine and gave another 1L bolus, BP held briefly --> pt transferred to stepdown for norepi gtt. C/o chest pressure, (+)murmur on cardiac exam, ?rales/atelectasis --> Echo, BNP elevated at 428, CXR, Troponin, EKG. Eval acidosis --> VBG was ok 02/22: BP improving, off norepi approx 10:00 am, scheduled midodrine and stress dose steroids (Cortisol level 20).  BCx NGx2d, GI PCR and CDiff pending, repeat BNP higher at 988, lactic and WBC coming down. Echo LVEF 50-55% w/ low normal fxn, mild LVH, indeterminate diastolic, moderate hypokinesis inferolateral LV, moderate aortic valve stenosis. Net IO Since Admission: 3,207.89 mL [09/28/22 1440] 02/23: neurology eval - Possible extrapyramidal symptoms from antipsychotics --> request psych eval for rx mgt, MRI brain ordered and EEG though CVA/seizures unlikely.     Consultants:   none  Procedures: none      ASSESSMENT & PLAN:   Principal Problem:   SIRS (systemic inflammatory response syndrome) (HCC) Active Problems:   Hypertension associated with diabetes (HCC)   Esophageal dysmotility   Chronic diastolic CHF (congestive heart failure) (HCC)   Paranoid schizophrenia (HCC)   Type 2 diabetes mellitus with morbid obesity (HCC)   Obstructive sleep apnea on CPAP   Hyperlipidemia associated with type 2 diabetes mellitus (HCC)   Persistent atrial fibrillation (HCC)   CAD (coronary artery disease)   Hoarding disorder   COPD, mild (HCC)   Atherosclerosis of aorta (HCC)   History of CVA (cerebrovascular accident)   Leukocytosis   AKI (acute kidney injury) ([place])   Shock circulatory (HCC)   Elevated lactic acid level   Left ventricular hypokinesis inferolateral wall   Moderate aortic stenosis   Hypotension - improved  SIRS (systemic inflammatory response syndrome) (HCC) / Severe sepsis unknown source Altered mental status - improved  Etiology workup in progress status post cefepime, vancomycin per pharmacy and LR total 3 L boluses Eval acidosis --> VBG no concerns  Continue with cefepime and vancomycin per pharmacy today 09/28/22 is Day 3 abx Follow BCx Levophed gtt in SDU - improved on this, off as of 10:00 this morning  ICU team following   Cardiogenic shock less likely  EKG appears c/w yesterday, P waves difficult to appreciate but rhythm appears regular Tropes very slight elevation, trend down, likely demand ischemia / hypovolemia  CXR WNL BNP slightly elevated 428, hold further fluids for now --> recheck today 988 and (+)aortic stenosis --> diurese if BP tolerates TSH pending  Continue scheduled midodrine and stress dose steroids initiated by ICU team, appreciate help  Procal 0.21  Left ventricular hypokinesis inferolateral wall Moderate aortic stenosis Consider  cardiology consult if worse, otherwise should be able to follow outpatient   Consider Lasix if BP tolerates  Strict I&O  Tremor, question parkinsonism (primary vs med effect) Pt reprots numbness lower extremities and "can't move" but then able to move somewhat, question neuro sensory/motor deficit Possible extrapyramidal symptoms from antipsychotics  request psych eval for rx mgt, should d/c haldol MRI brain ordered and EEG though CVA/seizures unlikely.  B12, ammonia, TSH Neurology to follow   AKI (acute kidney injury) ([place]) - resolved Monitor BMP   Leukocytosis Etiology workup in progress, treat per above Monitor CBC   History of CVA (cerebrovascular accident) Resumed home aspirin 81 mg daily, simvastatin 20 mg nightly  Essential Hypertension Holding home metoprolol succinate 50 mg daily at this time due to possible sepsis/severe sepsis presentation Hydralazine 5 mg IV every 8 hours as needed for SBP > 180, 4 days ordered   COPD, mild (HCC) Not in acute exacerbation at this time Albuterol nebulizer, every 6 hours as needed for wheezing and shortness of breath, 5 days ordered   Hyperlipidemia associated with type 2 diabetes mellitus (HCC) Simvastatin 20 mg nightly resumed   [name] Resumed home risperidone 0.5 mg p.o. twice daily, Haldol 1 mg nightly Concern for haldol causing extrapyramidal symptoms --> psychiatry consult for rx mgmt    History Atrial Fibrillation Continue Xarelto  Beta blocker on hold d/t hypotension    DVT prophylaxis: Xarelto Pertinent IV fluids/nutrition: LR bolus as above, continuous IV fluids following resuscitation Central lines / invasive devices: none  Code Status: FULL CODE   Current Admission Status: inpatient  TOC needs / Dispo plan: SNF placement Barriers to discharge / significant pending items: await placement, MRI, EEG, psychiatry eval - likely will be here thru the weekend (today is Friday)

## 2022-09-26 NOTE — Progress Notes (Signed)
CODE SEPSIS - PHARMACY COMMUNICATION  **Broad Spectrum Antibiotics should be administered within 1 hour of Sepsis diagnosis**  Time Code Sepsis Called/Page Received: 1533, after first antibiotics had been given  Antibiotics Ordered: vancomycin, cefepime  Time of 1st antibiotic administration: 1230  Additional action taken by pharmacy: N/A     Alison Murray ,PharmD Clinical Pharmacist  09/26/2022  4:49 PM

## 2022-09-27 ENCOUNTER — Inpatient Hospital Stay (HOSPITAL_COMMUNITY)
Admit: 2022-09-27 | Discharge: 2022-09-27 | Disposition: A | Discharge status: Home / Self Care | Payer: 59 | Attending: Osteopathic Medicine | Admitting: Osteopathic Medicine

## 2022-09-27 ENCOUNTER — Inpatient Hospital Stay: Payer: 59

## 2022-09-27 DIAGNOSIS — I4819 Other persistent atrial fibrillation: Secondary | ICD-10-CM

## 2022-09-27 DIAGNOSIS — F200 Paranoid schizophrenia: Secondary | ICD-10-CM

## 2022-09-27 DIAGNOSIS — I5032 Chronic diastolic (congestive) heart failure: Secondary | ICD-10-CM

## 2022-09-27 DIAGNOSIS — R579 Shock, unspecified: Secondary | ICD-10-CM | POA: Diagnosis not present

## 2022-09-27 DIAGNOSIS — N179 Acute kidney failure, unspecified: Secondary | ICD-10-CM | POA: Diagnosis not present

## 2022-09-27 DIAGNOSIS — R011 Cardiac murmur, unspecified: Secondary | ICD-10-CM | POA: Diagnosis not present

## 2022-09-27 DIAGNOSIS — I4581 Long QT syndrome: Secondary | ICD-10-CM

## 2022-09-27 DIAGNOSIS — R6510 Systemic inflammatory response syndrome (SIRS) of non-infectious origin without acute organ dysfunction: Secondary | ICD-10-CM | POA: Diagnosis not present

## 2022-09-27 DIAGNOSIS — R7989 Other specified abnormal findings of blood chemistry: Secondary | ICD-10-CM

## 2022-09-27 LAB — MAGNESIUM: Magnesium: 2.1 mg/dL (ref 1.7–2.4)

## 2022-09-27 LAB — CBC
HCT: 36.8 % — ABNORMAL LOW (ref 39.0–52.0)
Hemoglobin: 11.7 g/dL — ABNORMAL LOW (ref 13.0–17.0)
MCH: 28.7 pg (ref 26.0–34.0)
MCHC: 31.8 g/dL (ref 30.0–36.0)
MCV: 90.4 fL (ref 80.0–100.0)
Platelets: 393 10*3/uL (ref 150–400)
RBC: 4.07 MIL/uL — ABNORMAL LOW (ref 4.22–5.81)
RDW: 14.7 % (ref 11.5–15.5)
WBC: 13.2 10*3/uL — ABNORMAL HIGH (ref 4.0–10.5)
nRBC: 0 % (ref 0.0–0.2)

## 2022-09-27 LAB — LACTIC ACID, PLASMA
Lactic Acid, Venous: 2 mmol/L (ref 0.5–1.9)
Lactic Acid, Venous: 2.1 mmol/L (ref 0.5–1.9)
Lactic Acid, Venous: 1.6 mmol/L (ref 0.5–1.9)

## 2022-09-27 LAB — GLUCOSE, CAPILLARY
Glucose-Capillary: 103 mg/dL — ABNORMAL HIGH (ref 70–99)
Glucose-Capillary: 114 mg/dL — ABNORMAL HIGH (ref 70–99)
Glucose-Capillary: 97 mg/dL (ref 70–99)
Glucose-Capillary: 139 mg/dL — ABNORMAL HIGH (ref 70–99)
Glucose-Capillary: 86 mg/dL (ref 70–99)

## 2022-09-27 LAB — ECHOCARDIOGRAM COMPLETE
AR max vel: 0.86 cm2
AV Area VTI: 1.04 cm2
AV Area mean vel: 0.88 cm2
AV Mean grad: 17.7 mmHg
AV Peak grad: 31.8 mmHg
Ao pk vel: 2.82 m/s
Area-P 1/2: 5.88 cm2
Height: 65 in
P 1/2 time: 581 msec
S' Lateral: 2.9 cm
Weight: 2987.67 oz

## 2022-09-27 LAB — BLOOD GAS, VENOUS
Acid-base deficit: 0.2 mmol/L (ref 0.0–2.0)
Bicarbonate: 26.4 mmol/L (ref 20.0–28.0)
O2 Saturation: 56.7 %
Patient temperature: 37
pCO2, Ven: 50 mmHg (ref 44–60)
pH, Ven: 7.33 (ref 7.25–7.43)
pO2, Ven: 36 mmHg (ref 32–45)

## 2022-09-27 LAB — HEPATIC FUNCTION PANEL
ALT: 31 U/L (ref 0–44)
AST: 78 U/L — ABNORMAL HIGH (ref 15–41)
Albumin: 2.3 g/dL — ABNORMAL LOW (ref 3.5–5.0)
Alkaline Phosphatase: 72 U/L (ref 38–126)
Bilirubin, Direct: 0.3 mg/dL — ABNORMAL HIGH (ref 0.0–0.2)
Indirect Bilirubin: 0.6 mg/dL (ref 0.3–0.9)
Total Bilirubin: 0.9 mg/dL (ref 0.3–1.2)
Total Protein: 5.2 g/dL — ABNORMAL LOW (ref 6.5–8.1)

## 2022-09-27 LAB — BRAIN NATRIURETIC PEPTIDE: B Natriuretic Peptide: 428.7 pg/mL — ABNORMAL HIGH (ref 0.0–100.0)

## 2022-09-27 LAB — TROPONIN I (HIGH SENSITIVITY)
Troponin I (High Sensitivity): 63 ng/L — ABNORMAL HIGH (ref <18)
Troponin I (High Sensitivity): 58 ng/L — ABNORMAL HIGH (ref <18)

## 2022-09-27 LAB — BASIC METABOLIC PANEL
Anion gap: 10 (ref 5–15)
BUN: 24 mg/dL — ABNORMAL HIGH (ref 8–23)
CO2: 20 mmol/L — ABNORMAL LOW (ref 22–32)
Calcium: 8.3 mg/dL — ABNORMAL LOW (ref 8.9–10.3)
Chloride: 106 mmol/L (ref 98–111)
Creatinine, Ser: 0.86 mg/dL (ref 0.61–1.24)
GFR, Estimated: >60 mL/min (ref >60)
Glucose, Bld: 120 mg/dL — ABNORMAL HIGH (ref 70–99)
Potassium: 3.4 mmol/L — ABNORMAL LOW (ref 3.5–5.1)
Sodium: 136 mmol/L (ref 135–145)

## 2022-09-27 LAB — HEMOGLOBIN A1C
Hgb A1c MFr Bld: 6 % — ABNORMAL HIGH (ref 4.8–5.6)
Mean Plasma Glucose: 125.5 mg/dL

## 2022-09-27 LAB — PHOSPHORUS: Phosphorus: 2.9 mg/dL (ref 2.5–4.6)

## 2022-09-27 LAB — POTASSIUM: Potassium: 3.3 mmol/L — ABNORMAL LOW (ref 3.5–5.1)

## 2022-09-27 LAB — CORTISOL: Cortisol, Plasma: 20.2 ug/dL

## 2022-09-27 LAB — MRSA NEXT GEN BY PCR, NASAL: MRSA by PCR Next Gen: NOT DETECTED

## 2022-09-27 LAB — PROCALCITONIN: Procalcitonin: 0.21 ng/mL

## 2022-09-27 MED ORDER — NOREPINEPHRINE 4 MG/250ML-% IV SOLN: 0.0000 ug/min | INTRAVENOUS | Status: DC

## 2022-09-27 MED ORDER — POTASSIUM CHLORIDE CRYS ER 20 MEQ PO TBCR
40.0000 meq | EXTENDED_RELEASE_TABLET | Freq: Once | ORAL | Status: AC
  Administered 2022-09-28: 40 meq via ORAL
  Filled 2022-09-27: qty 2

## 2022-09-27 MED ORDER — LACTATED RINGERS IV BOLUS
500.0000 mL | Freq: Once | INTRAVENOUS | Status: AC
  Administered 2022-09-27: 500 mL via INTRAVENOUS

## 2022-09-27 MED ORDER — POTASSIUM CHLORIDE 10 MEQ/100ML IV SOLN
10.0000 meq | INTRAVENOUS | Status: AC
  Administered 2022-09-28 (×2): 10 meq via INTRAVENOUS
  Filled 2022-09-27 (×2): qty 100

## 2022-09-27 MED ORDER — MIDODRINE HCL 5 MG PO TABS
10.0000 mg | ORAL_TABLET | Freq: Three times a day (TID) | ORAL | Status: AC
  Administered 2022-09-27 (×3): 10 mg via ORAL
  Filled 2022-09-27 (×3): qty 2

## 2022-09-27 MED ORDER — LACTATED RINGERS IV BOLUS
1000.0000 mL | Freq: Once | INTRAVENOUS | Status: AC
  Administered 2022-09-27: 1000 mL via INTRAVENOUS

## 2022-09-27 MED ORDER — NOREPINEPHRINE 4 MG/250ML-% IV SOLN
2.0000 ug/min | INTRAVENOUS | Status: DC
  Administered 2022-09-27: 2 ug/min via INTRAVENOUS
  Filled 2022-09-27: qty 250

## 2022-09-27 MED ORDER — VANCOMYCIN HCL IN DEXTROSE 1-5 GM/200ML-% IV SOLN
1000.0000 mg | Freq: Two times a day (BID) | INTRAVENOUS | Status: DC
  Administered 2022-09-27 (×2): 1000 mg via INTRAVENOUS
  Filled 2022-09-27 (×3): qty 200

## 2022-09-27 MED ORDER — CHLORHEXIDINE GLUCONATE CLOTH 2 % EX PADS
6.0000 | MEDICATED_PAD | Freq: Every day | CUTANEOUS | Status: DC
  Administered 2022-09-27 – 2022-10-01 (×5): 6 via TOPICAL

## 2022-09-27 MED ORDER — LACTATED RINGERS IV SOLN: INTRAVENOUS | Status: DC

## 2022-09-27 MED ORDER — HYDROCORTISONE SOD SUC (PF) 100 MG IJ SOLR
100.0000 mg | Freq: Two times a day (BID) | INTRAMUSCULAR | Status: DC
  Administered 2022-09-28 – 2022-09-29 (×4): 100 mg via INTRAVENOUS
  Filled 2022-09-27 (×4): qty 2

## 2022-09-27 MED ORDER — SODIUM CHLORIDE 0.9 % IV SOLN
2.0000 g | Freq: Three times a day (TID) | INTRAVENOUS | Status: AC
  Administered 2022-09-27 – 2022-09-28 (×5): 2 g via INTRAVENOUS
  Filled 2022-09-27 (×3): qty 12.5
  Filled 2022-09-27: qty 2
  Filled 2022-09-27: qty 12.5
  Filled 2022-09-27: qty 2

## 2022-09-27 MED ORDER — SODIUM CHLORIDE 0.9 % IV SOLN: 250.0000 mL | INTRAVENOUS | Status: DC

## 2022-09-27 MED ORDER — MIDODRINE HCL 5 MG PO TABS: 10.0000 mg | ORAL_TABLET | Freq: Three times a day (TID) | ORAL | Status: DC

## 2022-09-27 NOTE — Progress Notes (Addendum)
An USGPIV (ultrasound guided PIV) 20G x 1.88" on RFA has been placed for short-term vasopressor infusion. A correctly placed ivWatch must be used when administering Vasopressors. Should this treatment be needed beyond 72 hours, central line access should be obtained.  It will be the responsibility of the bedside nurse to follow best practice to prevent extravasations.   
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
## 2022-09-27 NOTE — Plan of Care (Signed)
  Problem: Fluid Volume: Goal: Hemodynamic stability will improve Outcome: Progressing   Problem: Clinical Measurements: Goal: Diagnostic test results will improve Outcome: Progressing Goal: Signs and symptoms of infection will decrease Outcome: Progressing   Problem: Respiratory: Goal: Ability to maintain adequate ventilation will improve Outcome: Progressing   Problem: Health Behavior/Discharge Planning: Goal: Ability to manage health-related needs will improve Outcome: Progressing   Problem: Clinical Measurements: Goal: Ability to maintain clinical measurements within normal limits will improve Outcome: Progressing Goal: Will remain free from infection Outcome: Progressing Goal: Diagnostic test results will improve Outcome: Progressing Goal: Respiratory complications will improve Outcome: Progressing Goal: Cardiovascular complication will be avoided Outcome: Progressing

## 2022-09-27 NOTE — Progress Notes (Signed)
*  PRELIMINARY RESULTS* Echocardiogram 2D Echocardiogram has been performed.  Sherrie Sport 09/27/2022, 2:16 PM

## 2022-09-27 NOTE — Consult Note (Signed)
NAME:  Evan [name], MRN:  [id number], DOB:  03-Nov-1955, LOS: 1 ADMISSION DATE:  09/26/2022, CONSULTATION DATE: 09/27/22 REFERRING MD: Dr. Sheppard Coil, CHIEF COMPLAINT: Dizziness   History of Present Illness:  This is a 67 yo male who presented to [facility] ER on 02/20 via EMS from [facility] with c/o dizziness.  EMS reported he was found on the floor in his room.  Pt reported he was lightheaded while ambulating to the bathroom the morning of 02/20 when he started to feel dizzy and proceeded to lay on the floor.  He endorses having diarrhea for several days prior to ER presentation and dizziness over the past week.  Pt received a 200 ml iv fluid bolus en route to the ER.    ED Course In the ER pt c/o frequent urination which has been off/on for a while.  Pt slightly hypotensive.  Lab results revealed CO2 18/glucose 151/BUN 34/creatinine 1.55/lactic acid 3.3/wbc 18.5/platelets 512.  UA negative for UTI and CXR negative.  CT Head negative for acute intracranial abnormality.  Pt received broad spectrum abx and iv fluids.  He was subsequently admitted to the progressive care unit per hospitalist team for additional workup and treatment.  See detailed hospital course below under significant events.   CT Abd Pelvis: Cholelithiasis without evidence of acute cholecystitis. Fat containing umbilical hernia. Otherwise stable appearance of the abdomen and pelvis without acute finding. Aortic Atherosclerosis (ICD10-I70.0). CT Head:  No acute intracranial abnormality. Chronic small vessel disease and old small vessel infarct of the right thalamus.  Pertinent  Medical History  Anginal Pain  Anxiety Disorder Schizophrenia  Asthma  Atresia of Esophagus with Fistula  CABG CAD Cellulitis  Chronic Diastolic CHF  COPD [id number] GERD GI Bleed Pneumonia  HLD HTN MI Obesity  OSA PVD Panic Disorder  PTSD Diabetic Retinopathy  Stroke  Venous Stasis  Vertigo   Significant Hospital Events: Including  procedures, antibiotic start and stop dates in addition to other pertinent events   02/20: Pt admitted to the progressive care unit for sepsis of unknown etiology although recent hx of diarrhea in outpatient setting  02/21: Pt developed hypotension overnight initially responded to iv fluid resuscitation, however he became hypotensive again requiring transfer to the stepdown unit for levophed gtt. PCCM consulted to assist with management   Interim History / Subjective:  Pt currently requiring levophed gtt @4$  mcg/min to maintain map >65.  He is confused to situation at times, but able to follow commands.  He denies having episodes of diarrhea while hospitalized.  Pt endorses very poor po intake    Objective   Blood pressure 97/62, pulse 90, temperature 97.7 F (36.5 C), temperature source Axillary, resp. rate 17, height 5' 5"$  (1.651 m), weight 84.7 kg, SpO2 100 %.        Intake/Output Summary (Last 24 hours) at 09/27/2022 1215 Last data filed at 09/27/2022 0631 Gross per 24 hour  Intake 1084.26 ml  Output 1150 ml  Net -65.74 ml   Filed Weights   09/26/22 0945 09/27/22 1206  Weight: 89 kg 84.7 kg    Examination: General: Acutely-ill appearing male, resting in bed NAD  HENT: Supple, no JVD  Lungs: Clear throughout, even, non labored  Cardiovascular: Irregular irregular, no m/r/g, 2+ radial/1+ distal pulses, no edema  Abdomen: +BS x4, obese, soft, non tender, non distended  Extremities: Moves all extremities, normal bulk  Skin: Scattered ecchymosis, cool to touch  Neuro: Alert and following commands, confused to situation at times, PERRLA  GU: Voiding   Resolved Hospital Problem list     Assessment & Plan:   Septic and hypovolemic shock  - IV fluid resuscitation and prn levophed gtt to maintain map >65 - Continue scheduled midodrine  - Hold outpatient beta-blocker therapy  - Cortisol and thyroid panel with TSH results pending  - GI and Cdiff panel pending   Elevated troponin  likely secondary to demand ischemia  - Trend troponin until peaked  - Echo pending   Chronic atrial fibrillation~rate controlled  Hx: Chronic diastolic CHF, CAD, HTN, HLD, CABG, and PVD  - Continue outpatient aspirin, rivaroxaban, and simvastatin   COPD~stable  Hx: OSA  - Continue scheduled and prn bronchodilator therapy   Acute kidney injury secondary to ATN~resolved   Hypokalemia  - Trend BMP  - Replace electrolytes as indicated  - Monitor UOP  - Avoid nephrotoxic medications  Sepsis of unknown etiology  Lactic acidosis  - Trend WBC and monitor fever curve - Trend lactic acid - Check PCT  - Follow cultures - Continue cefepime and vancomycin pending culture results and sensitivities   Type II diabetes mellitus  - CBG's ac/hs - SSI   Schizophrenia  Panic/Anxiety disorder  - Continue outpatient antipsychotics   Best Practice (right click and "Reselect all SmartList Selections" daily)   Diet/type: Regular consistency (see orders) DVT prophylaxis: Rivaroxaban  GI prophylaxis: PPI Lines: N/A Foley:  N/A Code Status:  full code Last date of multidisciplinary goals of care discussion [N/A]  Labs   CBC: Recent Labs  Lab 09/26/22 1010 09/27/22 0529  WBC 18.5* 13.2*  HGB 14.3 11.7*  HCT 44.7 36.8*  MCV 88.7 90.4  PLT 512* [id number]    Basic Metabolic Panel: Recent Labs  Lab 09/26/22 1010 09/27/22 0529  NA 137 136  K 3.7 3.4*  CL 104 106  CO2 18* 20*  GLUCOSE 151* 120*  BUN 34* 24*  CREATININE 1.55* 0.86  CALCIUM 9.2 8.3*   GFR: Estimated Creatinine Clearance: 82.3 mL/min (by C-G formula based on SCr of 0.86 mg/dL). Recent Labs  Lab 09/26/22 1010 09/26/22 1117 09/26/22 1301 09/27/22 0529 09/27/22 0757  WBC 18.5*  --   --  13.2*  --   LATICACIDVEN  --  3.3* 3.0*  --  2.0*    Liver Function Tests: No results for input(s): "AST", "ALT", "ALKPHOS", "BILITOT", "PROT", "ALBUMIN" in the last 168 hours. No results for input(s): "LIPASE", "AMYLASE" in the  last 168 hours. No results for input(s): "AMMONIA" in the last 168 hours.  ABG No results found for: "PHART", "PCO2ART", "PO2ART", "HCO3", "TCO2", "ACIDBASEDEF", "O2SAT"   Coagulation Profile: No results for input(s): "INR", "PROTIME" in the last 168 hours.  Cardiac Enzymes: No results for input(s): "CKTOTAL", "CKMB", "CKMBINDEX", "TROPONINI" in the last 168 hours.  HbA1C: HB A1C (BAYER DCA - WAIVED)  Date/Time Value Ref Range Status  11/10/2021 09:30 AM 5.7 (H) 4.8 - 5.6 % Final    Comment:             Prediabetes: 5.7 - 6.4          Diabetes: >6.4          Glycemic control for adults with diabetes: <7.0   08/12/2021 09:29 AM 5.9 (H) 4.8 - 5.6 % Final    Comment:             Prediabetes: 5.7 - 6.4          Diabetes: >6.4  Glycemic control for adults with diabetes: <7.0    Hgb A1c MFr Bld  Date/Time Value Ref Range Status  09/27/2022 05:29 AM 6.0 (H) 4.8 - 5.6 % Final    Comment:    (NOTE) Pre diabetes:          5.7%-6.4%  Diabetes:              >6.4%  Glycemic control for   <7.0% adults with diabetes   12/12/2021 05:21 PM 6.3 (H) 4.8 - 5.6 % Final    Comment:    (NOTE) Pre diabetes:          5.7%-6.4%  Diabetes:              >6.4%  Glycemic control for   <7.0% adults with diabetes     CBG: Recent Labs  Lab 09/26/22 1001 09/26/22 2149 09/27/22 0746 09/27/22 1140 09/27/22 1209  GLUCAP 169* 128* 103* 114* 97    Review of Systems: Positives in BOLD   Gen: Denies fever, chills, weight change, fatigue, night sweats HEENT: Denies blurred vision, double vision, hearing loss, tinnitus, sinus congestion, rhinorrhea, sore throat, neck stiffness, dysphagia PULM: Denies shortness of breath, cough, sputum production, hemoptysis, wheezing CV: Denies chest pain, edema, orthopnea, paroxysmal nocturnal dyspnea, palpitations GI: abdominal pain, nausea, vomiting, diarrhea, hematochezia, melena, constipation, change in bowel habits GU: Denies dysuria,  hematuria, polyuria, oliguria, urethral discharge Endocrine: Denies hot or cold intolerance, polyuria, polyphagia or appetite change Derm: Denies rash, dry skin, scaling or peeling skin change Heme: Denies easy bruising, bleeding, bleeding gums Neuro: headache, numbness, dizziness/weakness, slurred speech, loss of memory or consciousness  Past Medical History:  He,  has a past medical history of Anginal pain ([place]), Anxiety disorder, Asthma, Atresia of esophagus without fistula, CAD (coronary artery disease), Cellulitis, CHF (congestive heart failure) ([place]), COPD (chronic obstructive pulmonary disease) ([place]), COVID-19, Diabetes mellitus without complication ([place]), Edema, Gastroesophageal reflux, H/O: GI bleed, History of pneumonia, History of scarlet fever, Hyperlipidemia, Hypertension, Myocardial infarction ([place]) (2009), Obesity, Obstructive sleep apnea, Pain, Panic disorder, Peripheral venous insufficiency, PTSD (post-traumatic stress disorder), Retinopathy, Stasis, venous, Stroke ([place]), and Vertigo.   Surgical History:   Past Surgical History:  Procedure Laterality Date   CARDIAC CATHETERIZATION     CATARACT EXTRACTION Left    CATARACT EXTRACTION W/PHACO Right 05/03/2017   Procedure: CATARACT EXTRACTION PHACO AND INTRAOCULAR LENS PLACEMENT (IOC);  Surgeon: Leandrew Koyanagi, MD;  Location: [facility] ORS;  Service: Ophthalmology;  Laterality: Right;  [place] 00:35.3 AP% 12.3 CDE 4.33 Fluid Pack lot # [id number]        COLONOSCOPY WITH PROPOFOL N/A 11/26/2020   Procedure: COLONOSCOPY WITH PROPOFOL;  Surgeon: Jonathon Bellows, MD;  Location: [facility] ENDOSCOPY;  Service: Gastroenterology;  Laterality: N/A;  ANNETTE TO PICK UP [phone]   CORONARY ANGIOPLASTY WITH STENT PLACEMENT  2002   CORONARY ANGIOPLASTY WITH STENT PLACEMENT  1999   CORONARY ARTERY BYPASS GRAFT     x7   ESOPHAGOGASTRODUODENOSCOPY N/A 09/19/2016   Procedure: ESOPHAGOGASTRODUODENOSCOPY (EGD);  Surgeon: Lollie Sails, MD;  Location:  [facility] ENDOSCOPY;  Service: Endoscopy;  Laterality: N/A;   ESOPHAGOGASTRODUODENOSCOPY (EGD) WITH PROPOFOL  11/26/2020   Procedure: ESOPHAGOGASTRODUODENOSCOPY (EGD) WITH PROPOFOL;  Surgeon: Jonathon Bellows, MD;  Location: [facility] ENDOSCOPY;  Service: Gastroenterology;;     Social History:   reports that he has never smoked. He has never used smokeless tobacco. He reports that he does not drink alcohol and does not use drugs.   Family History:  His family history includes Coronary  artery disease in an other family member; Heart attack in his mother; Heart attack (age of onset: 11) in his brother; Hyperlipidemia in his mother; Hypertension in his mother.   Allergies Allergies  Allergen Reactions   Penicillin G Hives   Sulfa Antibiotics Hives   Tiotropium    Zoloft [Sertraline Hcl] Other (See Comments)     Home Medications  Prior to Admission medications   Medication Sig Start Date End Date Taking? Authorizing Provider  albuterol (VENTOLIN HFA) 108 (90 Base) MCG/ACT inhaler Inhale 2 puffs into the lungs every 6 (six) hours as needed for wheezing or shortness of breath. 12/16/21  Yes Clapacs, Madie Reno, MD  aspirin EC 81 MG EC tablet Take 1 tablet (81 mg total) by mouth daily. Swallow whole. 12/17/21  Yes Clapacs, Madie Reno, MD  CEPACOL EXTRA STRENGTH 15-2.6 MG LOZG SMARTSIG:1 Lozenge(s) By Mouth Every 2 Hours PRN 07/10/22  Yes [provider]  D-5000 125 MCG (5000 UT) TABS Take 1 tablet by mouth daily. 01/09/22  Yes [provider]  empagliflozin (JARDIANCE) 10 MG TABS tablet Take 1 tablet (10 mg total) by mouth daily. 12/16/21  Yes Clapacs, Madie Reno, MD  furosemide (LASIX) 40 MG tablet TAKE 2 TABLETS BY MOUTH ONCE EVERY MORNING AND 1 TABLET ONCE EVERY EVENING 12/16/21  Yes Clapacs, Madie Reno, MD  haloperidol (HALDOL) 1 MG tablet Take 1 mg by mouth at bedtime. 01/30/22  Yes [provider]  magnesium hydroxide (MILK OF MAGNESIA) 400 MG/5ML suspension Take 5 mLs by mouth daily as needed for  mild constipation.   Yes [provider]  metoprolol succinate (TOPROL-XL) 50 MG 24 hr tablet Take 1 tablet (50 mg total) by mouth daily. 12/17/21  Yes Clapacs, Madie Reno, MD  MUCINEX MAXIMUM STRENGTH 1200 MG TB12 Take 1 tablet by mouth 2 (two) times daily. 07/10/22  Yes [provider]  nitroGLYCERIN (NITROSTAT) 0.4 MG SL tablet Place 1 tablet (0.4 mg total) under the tongue every 5 (five) minutes as needed for chest pain. 12/16/21  Yes Clapacs, Madie Reno, MD  nystatin cream (MYCOSTATIN) Apply topically 2 (two) times daily. 08/28/22  Yes [provider]  pantoprazole (PROTONIX) 40 MG tablet Take 1 tablet (40 mg total) by mouth daily. 12/16/21  Yes Clapacs, Madie Reno, MD  potassium chloride SA (KLOR-CON M) 20 MEQ tablet Take 40 mEq by mouth daily. 08/02/22  Yes [provider]  psyllium (HYDROCIL/METAMUCIL) 95 % PACK Take 1 packet by mouth daily. 12/17/21  Yes Clapacs, Madie Reno, MD  rivaroxaban (XARELTO) 20 MG TABS tablet Take 1 tablet (20 mg total) by mouth daily with supper. 12/16/21  Yes Clapacs, Madie Reno, MD  Saccharomyces boulardii (PROBIOTIC) 250 MG CAPS TAKE 1 CAPSULE BY MOUTH EVERY MORNING AND AT BEDTIME 11/22/21  Yes [name], [name] T, NP  Sennosides (SENNA) 8.6 MG CAPS Take 2 capsules by mouth daily. 01/30/22  Yes [provider]  simvastatin (ZOCOR) 20 MG tablet Take 20 mg by mouth at bedtime. 05/18/22  Yes [provider]  SYMBICORT 80-4.5 MCG/ACT inhaler INHALE 2 PUFFS BY MOUTH 2 TIMES DAILY. RINSE MOUTH AFTER USE. 11/17/21  Yes [name], [name] T, NP  triamcinolone cream (KENALOG) 0.1 % Apply topically. 05/15/22  Yes [provider]  TYLENOL 500 MG tablet Take 500 mg by mouth 3 (three) times daily as needed. 07/10/22  Yes [provider]  vitamin B-12 (CYANOCOBALAMIN) 1000 MCG tablet Take 1 tablet (1,000 mcg total) by mouth daily. 12/16/21  Yes Clapacs, Madie Reno,  MD  ZYRTEC ALLERGY 10 MG tablet Take 10 mg by mouth at bedtime. 05/15/22  Yes  [provider]  busPIRone (BUSPAR) 5 MG tablet Take 1 tablet (5 mg total) by mouth 2 (two) times daily. Patient not taking: Reported on 09/26/2022 12/16/21   Clapacs, Madie Reno, MD  ezetimibe (ZETIA) 10 MG tablet Take 1 tablet (10 mg total) by mouth daily. Patient not taking: Reported on 09/26/2022 12/16/21   Clapacs, Madie Reno, MD  olmesartan (BENICAR) 5 MG tablet Take 1 tablet (5 mg total) by mouth daily. Patient not taking: Reported on 09/26/2022 12/16/21   Clapacs, Madie Reno, MD  risperiDONE (RISPERDAL) 0.5 MG tablet Take 1 tablet (0.5 mg total) by mouth 2 (two) times daily. Patient taking differently: Take 1 mg by mouth 2 (two) times daily. 12/16/21   Clapacs, Madie Reno, MD  rosuvastatin (CRESTOR) 5 MG tablet Take 1 tablet (5 mg total) by mouth daily. 12/17/21   Clapacs, Madie Reno, MD     Critical care time: 30 minutes      Donell Beers, [place] Pager [phone] (please enter 7 digits) PCCM Consult Pager [phone] (please enter 7 digits)

## 2022-09-27 NOTE — Progress Notes (Signed)
       CROSS COVER NOTE  NAME: Evan [name] MRN: [id number] DOB : 1956-06-28 ATTENDING PHYSICIAN: Cox, Briant Cedar, DO    Date of Service   09/27/2022   HPI/Events of Note   Notified of hypotension --> BP 81/53 MAP 64. BP has been fluid responsive previously, will give another bolus now.    Interventions   Assessment/Plan:  1L LR bolus       To reach the provider On-Call:   7AM- 7PM see care teams to locate the attending and reach out to them via [email or web address]: [email or web address] 7PM-7AM contact night-coverage If you still have difficulty reaching the appropriate provider, please page the [facility] (Director on Call) for [facility] on amion for assistance  This document was prepared using Systems analyst and may include unintentional dictation errors.  Neomia Glass DNP, MBA, FNP-BC, PMHNP-BC Nurse Practitioner [facility] Pager [phone]

## 2022-09-27 NOTE — Progress Notes (Addendum)
PROGRESS NOTE    Evan [name]   [name] DOB: April 08, 1956  DOA: 09/26/2022 Date of Service: 09/27/22 PCP: [name], FNP     Brief Narrative / Hospital Course:  [name] is a 67 year old male with history of hyperlipidemia, paranoid schizophrenia, CAD, non-insulin-dependent diabetes mellitus, hypertension, obesity, diabetic retinopathy, who presents emergency department for chief concerns of dizziness from [facility] via EMS. 02/20: afebrile, RR 21, tachycardic at HR 120, blood pressure 101/72, SpO2 94% on room air. Serum creatinine 1.55, EGFR 48, WBC 18.5, hemoglobin 14.3, platelets 512, Lactic acid was 3.3 and on repeat was 3.0. COVID/influenza A/influenza B/RSV PCR were negative. UA was negative for leukocytes and nitrates. Blood cultures x 2 have been ordered and are in process.CT abdomen pelvis with contrast: Was read as cholelithiasis without evidence of acute cholecystitis.  Fat-containing umbilical hernia.  Otherwise stable appearance of the abdomen pelvis without acute finding.  Aortic atherosclerosis. ED treatment: Cefepime and vancomycin per pharmacy.  LR 1 L bolus. Admitted to hospitalist service for sepsis unknown source. 02/21: hypotensive overnight, responsive to fluid bolus, BP still low in AM, started midodrine and gave another 1L bolus, BP held briefly --> pt transferred to stepdown for levo gtt. C/o chest pressure, (+)murmur on cardiac exam, ?rales/atelectasis --> Echo, BNP, CXR, Troponin, EKG. Eval acidosis --> VBG    Consultants:  none  Procedures: none      ASSESSMENT & PLAN:   Principal Problem:   SIRS (systemic inflammatory response syndrome) (HCC) Active Problems:   Hypertension associated with diabetes ([place])   Esophageal dysmotility   Paranoid schizophrenia ([place])   Type 2 diabetes mellitus with morbid obesity (HCC)   Hyperlipidemia associated with type 2 diabetes mellitus (HCC)   CAD (coronary artery disease)   Hoarding  disorder   COPD, mild (HCC)   Atherosclerosis of aorta (HCC)   History of CVA (cerebrovascular accident)   Leukocytosis   AKI (acute kidney injury) ([place])   Hypotension SIRS (systemic inflammatory response syndrome) (HCC) / Severe sepsis unknown source Altered mental status - improved  Etiology workup in progress status post cefepime, vancomycin per pharmacy and LR total 3 L boluses Continue with cefepime and vancomycin per pharmacy today 09/27/22 is Day 2 abx Follow BCx Levophed gtt in SDU ordered ICU team consulted  Cardiogenic shock seems less likely but obtain Echo, tropes, BNP, CXR, EKG EKG appears c/w yesterday, P waves difficult to appreciate but rhythm appears regular Troped very slight elevation, continue to trend, likely demand ischemia / hypovolemia  CXR WNL BNP slightly elevated 428, hold further fluids for now  Eval acidosis --> VBG no concerns  TSH pending  Cortisol pending  Procal 0.21   AKI (acute kidney injury) ([place]) - resolved Monitor BMP   Leukocytosis Etiology workup in progress, treat per above Monitor CBC   History of CVA (cerebrovascular accident) Resumed home aspirin 81 mg daily, simvastatin 20 mg nightly  Essential Hypertension Holding home metoprolol succinate 50 mg daily at this time due to possible sepsis/severe sepsis presentation Hydralazine 5 mg IV every 8 hours as needed for SBP > 180, 4 days ordered   COPD, mild ([place]) Not in acute exacerbation at this time Albuterol nebulizer, every 6 hours as needed for wheezing and shortness of breath, 5 days ordered   Hyperlipidemia associated with type 2 diabetes mellitus (HCC) Simvastatin 20 mg nightly resumed   Paranoid schizophrenia ([place]) Resumed home risperidone 0.5 mg p.o. twice daily, Haldol 1 mg nightly   History Atrial Fibrillation Continue  Xarelto  Beta blocker on hold d/t hypotension    DVT prophylaxis: Xarelto Pertinent IV fluids/nutrition: LR bolus as above, continuous IV fluids  following resuscitation Central lines / invasive devices: none  Code Status: FULL CODE   Current Admission Status: inpatient  TOC needs / Dispo plan: none at this time  Barriers to discharge / significant pending items: await cultures and clinical improvement              Subjective / Brief ROS:  when asked how he is he states "I'm ok" but answers "yes" to ROS questions - reports some mild pressure in his chest, feels tired, feels lightheaded  Pain controlled.     Family Communication: will update by phone later today     Objective Findings:  Vitals:   09/27/22 1330 09/27/22 1345 09/27/22 1400 09/27/22 1415  BP: (!) 110/53 102/64 101/60 (!) 92/52  Pulse: 70 (!) 56 67 63  Resp: 13 15 13 13  $ Temp:      TempSrc:      SpO2: 99% 97% 99% 99%  Weight:      Height:        Intake/Output Summary (Last 24 hours) at 09/27/2022 1436 Last data filed at 09/27/2022 1424 Gross per 24 hour  Intake 2227.48 ml  Output 1400 ml  Net 827.48 ml   Filed Weights   09/26/22 0945 09/27/22 1206  Weight: 89 kg 84.7 kg    Examination:  Physical Exam Constitutional:      General: He is not in acute distress.    Appearance: Normal appearance. He is not toxic-appearing.  HENT:     Nose:     Comments: Reports some epistaxis, has dried blood scabs in nares no active bleeding    Mouth/Throat:     Mouth: Mucous membranes are dry.  Eyes:     Extraocular Movements: Extraocular movements intact.  Cardiovascular:     Rate and Rhythm: Normal rate and regular rhythm.     Heart sounds: Murmur heard.  Pulmonary:     Effort: No respiratory distress.     Breath sounds: Rales (faint bilateral bases) present.  Abdominal:     General: Abdomen is flat.     Palpations: Abdomen is soft.     Tenderness: There is no abdominal tenderness.  Musculoskeletal:     Right lower leg: No edema.     Left lower leg: No edema.  Skin:    General: Skin is dry.     Coloration: Skin is pale.  Neurological:      General: No focal deficit present.     Mental Status: He is alert.  Psychiatric:        Mood and Affect: Mood normal.        Behavior: Behavior normal.          Scheduled Medications:   aspirin EC  81 mg Oral Daily   Chlorhexidine Gluconate Cloth  6 each Topical Daily   haloperidol  1 mg Oral QHS   insulin aspart  0-5 Units Subcutaneous QHS   insulin aspart  0-9 Units Subcutaneous TID WC   midodrine  10 mg Oral TID WC   mometasone-formoterol  2 puff Inhalation BID   pantoprazole  40 mg Oral QAC breakfast   risperiDONE  0.5 mg Oral BID   rivaroxaban  20 mg Oral Q supper   senna  2 tablet Oral QHS   simvastatin  20 mg Oral QHS    Continuous Infusions:  sodium chloride  150 mL/hr at 09/27/22 1424   sodium chloride Stopped (09/27/22 1211)   ceFEPime (MAXIPIME) IV 200 mL/hr at 09/27/22 1424   norepinephrine (LEVOPHED) Adult infusion 4 mcg/min (09/27/22 1424)   vancomycin Stopped (09/27/22 1155)    PRN Medications:  acetaminophen **OR** acetaminophen, albuterol, melatonin, menthol-cetylpyridinium, nitroGLYCERIN, nystatin cream, ondansetron **OR** ondansetron (ZOFRAN) IV, senna-docusate  Antimicrobials from admission:  Anti-infectives (From admission, onward)    Start     Dose/Rate Route Frequency Ordered Stop   09/27/22 1100  vancomycin (VANCOCIN) IVPB 1000 mg/200 mL premix        1,000 mg 200 mL/hr over 60 Minutes Intravenous Every 12 hours 09/27/22 1025     09/27/22 1015  ceFEPIme (MAXIPIME) 2 g in sodium chloride 0.9 % 100 mL IVPB        2 g 200 mL/hr over 30 Minutes Intravenous Every 8 hours 09/27/22 1012     09/27/22 0030  ceFEPIme (MAXIPIME) 2 g in sodium chloride 0.9 % 100 mL IVPB  Status:  Discontinued        2 g 200 mL/hr over 30 Minutes Intravenous Every 12 hours 09/26/22 1647 09/27/22 1012   09/26/22 1630  vancomycin (VANCOREADY) IVPB 750 mg/150 mL        750 mg 150 mL/hr over 60 Minutes Intravenous  Once 09/26/22 1546 09/26/22 1931   09/26/22 1230   ceFEPIme (MAXIPIME) 2 g in sodium chloride 0.9 % 100 mL IVPB        2 g 200 mL/hr over 30 Minutes Intravenous  Once 09/26/22 1220 09/26/22 1302   09/26/22 1230  vancomycin (VANCOCIN) IVPB 1000 mg/200 mL premix        1,000 mg 200 mL/hr over 60 Minutes Intravenous  Once 09/26/22 1220 09/26/22 1410           Data Reviewed:  I have personally reviewed the following...  CBC: Recent Labs  Lab 09/26/22 1010 09/27/22 0529  WBC 18.5* 13.2*  HGB 14.3 11.7*  HCT 44.7 36.8*  MCV 88.7 90.4  PLT 512* [id number]   Basic Metabolic Panel: Recent Labs  Lab 09/26/22 1010 09/27/22 0529  NA 137 136  K 3.7 3.4*  CL 104 106  CO2 18* 20*  GLUCOSE 151* 120*  BUN 34* 24*  CREATININE 1.55* 0.86  CALCIUM 9.2 8.3*   GFR: Estimated Creatinine Clearance: 82.3 mL/min (by C-G formula based on SCr of 0.86 mg/dL). Liver Function Tests: No results for input(s): "AST", "ALT", "ALKPHOS", "BILITOT", "PROT", "ALBUMIN" in the last 168 hours. No results for input(s): "LIPASE", "AMYLASE" in the last 168 hours. No results for input(s): "AMMONIA" in the last 168 hours. Coagulation Profile: No results for input(s): "INR", "PROTIME" in the last 168 hours. Cardiac Enzymes: No results for input(s): "CKTOTAL", "CKMB", "CKMBINDEX", "TROPONINI" in the last 168 hours. BNP (last 3 results) No results for input(s): "PROBNP" in the last 8760 hours. HbA1C: Recent Labs    09/27/22 0529  HGBA1C 6.0*   CBG: Recent Labs  Lab 09/26/22 1001 09/26/22 2149 09/27/22 0746 09/27/22 1140 09/27/22 1209  GLUCAP 169* 128* 103* 114* 97   Lipid Profile: No results for input(s): "CHOL", "HDL", "LDLCALC", "TRIG", "CHOLHDL", "LDLDIRECT" in the last 72 hours. Thyroid Function Tests: No results for input(s): "TSH", "T4TOTAL", "FREET4", "T3FREE", "THYROIDAB" in the last 72 hours. Anemia Panel: No results for input(s): "VITAMINB12", "FOLATE", "FERRITIN", "TIBC", "IRON", "RETICCTPCT" in the last 72 hours. Most Recent Urinalysis  On File:     Component Value Date/Time   COLORURINE YELLOW (A) 09/26/2022  [place] (A) 09/26/2022 1117   APPEARANCEUR Clear 09/20/2021 0856   LABSPEC 1.013 09/26/2022 1117   LABSPEC 1.013 11/29/2014 1120   PHURINE 5.0 09/26/2022 1117   GLUCOSEU >=500 (A) 09/26/2022 1117   GLUCOSEU Negative 11/29/2014 1120   HGBUR LARGE (A) 09/26/2022 1117   BILIRUBINUR NEGATIVE 09/26/2022 1117   BILIRUBINUR Negative 09/20/2021 0856   BILIRUBINUR Negative 11/29/2014 1120   KETONESUR NEGATIVE 09/26/2022 1117   PROTEINUR 30 (A) 09/26/2022 1117   NITRITE NEGATIVE 09/26/2022 1117   LEUKOCYTESUR NEGATIVE 09/26/2022 1117   LEUKOCYTESUR Negative 11/29/2014 1120   Sepsis Labs: @LABRCNTIP$ (procalcitonin:4,lacticidven:4) Microbiology: Recent Results (from the past 240 hour(s))  Resp panel by RT-PCR (RSV, Flu A&B, Covid) Anterior Nasal Swab     Status: None   Collection Time: 09/26/22 10:03 AM   Specimen: Anterior Nasal Swab  Result Value Ref Range Status   SARS Coronavirus 2 by RT PCR NEGATIVE NEGATIVE Final    Comment: (NOTE) SARS-CoV-2 target nucleic acids are NOT DETECTED.  The SARS-CoV-2 RNA is generally detectable in upper respiratory specimens during the acute phase of infection. The lowest concentration of SARS-CoV-2 viral copies this assay can detect is 138 copies/mL. A negative result does not preclude SARS-Cov-2 infection and should not be used as the sole basis for treatment or other patient management decisions. A negative result may occur with  improper specimen collection/handling, submission of specimen other than nasopharyngeal swab, presence of viral mutation(s) within the areas targeted by this assay, and inadequate number of viral copies(<138 copies/mL). A negative result must be combined with clinical observations, patient history, and epidemiological information. The expected result is Negative.  Fact Sheet for Patients:   EntrepreneurPulse.com.au  Fact Sheet for Healthcare Providers:  IncredibleEmployment.be  This test is no t yet approved or cleared by the [place] FDA and  has been authorized for detection and/or diagnosis of SARS-CoV-2 by FDA under an Emergency Use Authorization (EUA). This EUA will remain  in effect (meaning this test can be used) for the duration of the COVID-19 declaration under Section 564(b)(1) of the Act, 21 U.S.C.section 360bbb-3(b)(1), unless the authorization is terminated  or revoked sooner.       Influenza A by PCR NEGATIVE NEGATIVE Final   Influenza B by PCR NEGATIVE NEGATIVE Final    Comment: (NOTE) The Xpert Xpress SARS-CoV-2/FLU/RSV plus assay is intended as an aid in the diagnosis of influenza from Nasopharyngeal swab specimens and should not be used as a sole basis for treatment. Nasal washings and aspirates are unacceptable for Xpert Xpress SARS-CoV-2/FLU/RSV testing.  Fact Sheet for Patients: EntrepreneurPulse.com.au  Fact Sheet for Healthcare Providers: IncredibleEmployment.be  This test is not yet approved or cleared by the [place] FDA and has been authorized for detection and/or diagnosis of SARS-CoV-2 by FDA under an Emergency Use Authorization (EUA). This EUA will remain in effect (meaning this test can be used) for the duration of the COVID-19 declaration under Section 564(b)(1) of the Act, 21 U.S.C. section 360bbb-3(b)(1), unless the authorization is terminated or revoked.     Resp Syncytial Virus by PCR NEGATIVE NEGATIVE Final    Comment: (NOTE) Fact Sheet for Patients: EntrepreneurPulse.com.au  Fact Sheet for Healthcare Providers: IncredibleEmployment.be  This test is not yet approved or cleared by the [place] FDA and has been authorized for detection and/or diagnosis of SARS-CoV-2 by FDA under an Emergency Use  Authorization (EUA). This EUA will remain in effect (meaning this test can be used) for the duration of the  COVID-19 declaration under Section 564(b)(1) of the Act, 21 U.S.C. section 360bbb-3(b)(1), unless the authorization is terminated or revoked.  Performed at [facility], [address] [place], [place] [address]   MRSA Next Gen by PCR, Nasal     Status: None   Collection Time: 09/27/22 12:08 PM   Specimen: Nasal Mucosa; Nasal Swab  Result Value Ref Range Status   MRSA by PCR Next Gen NOT DETECTED NOT DETECTED Final    Comment: (NOTE) The GeneXpert MRSA Assay (FDA approved for NASAL specimens only), is one component of a comprehensive MRSA colonization surveillance program. It is not intended to diagnose MRSA infection nor to guide or monitor treatment for MRSA infections. Test performance is not FDA approved in patients less than 17 years old. Performed at [facility], [address] [place], [place] [address]       Radiology Studies last 3 days: [facility] Chest [facility] 1 View  Result Date: 09/27/2022 CLINICAL DATA:  Shortness of breath, history COPD, hypertension, CHF, diabetes mellitus EXAM: PORTABLE CHEST 1 VIEW COMPARISON:  Portable exam 1211 hours compared to 09/26/2022 FINDINGS: Borderline enlargement of cardiac silhouette post CABG. Mediastinal contours and pulmonary vascularity normal. Eventration RIGHT diaphragm unchanged. No definite infiltrate, pleural effusion, or pneumothorax. Osseous structures unremarkable. IMPRESSION: No acute abnormalities. Electronically Signed   By: Lavonia Dana M.D.   On: 09/27/2022 13:39   CT HEAD WO CONTRAST (5MM)  Result Date: 09/26/2022 CLINICAL DATA:  Altered mental status EXAM: CT HEAD WITHOUT CONTRAST TECHNIQUE: Contiguous axial images were obtained from the base of the skull through the vertex without intravenous contrast. RADIATION DOSE REDUCTION: This exam was performed according to the departmental dose-optimization  program which includes automated exposure control, adjustment of the mA and/or kV according to patient size and/or use of iterative reconstruction technique. COMPARISON:  None Available. FINDINGS: Brain: There is no mass, hemorrhage or extra-axial collection. The size and configuration of the ventricles and extra-axial CSF spaces are normal. There is hypoattenuation of the white matter, most commonly indicating chronic small vessel disease. Old small vessel infarct of the right thalamus. Vascular: Atherosclerotic calcification of the internal carotid arteries at the skull base. No abnormal hyperdensity of the major intracranial arteries or dural venous sinuses. Skull: The visualized skull base, calvarium and extracranial soft tissues are normal. Sinuses/Orbits: No fluid levels or advanced mucosal thickening of the visualized paranasal sinuses. No mastoid or middle ear effusion. The orbits are normal. IMPRESSION: 1. No acute intracranial abnormality. 2. Chronic small vessel disease and old small vessel infarct of the right thalamus. Electronically Signed   By: Ulyses Jarred M.D.   On: 09/26/2022 20:54   CT ABDOMEN PELVIS W CONTRAST  Result Date: 09/26/2022 CLINICAL DATA:  Dizziness, weakness, diarrhea, found down EXAM: CT ABDOMEN AND PELVIS WITH CONTRAST TECHNIQUE: Multidetector CT imaging of the abdomen and pelvis was performed using the standard protocol following bolus administration of intravenous contrast. RADIATION DOSE REDUCTION: This exam was performed according to the departmental dose-optimization program which includes automated exposure control, adjustment of the mA and/or kV according to patient size and/or use of iterative reconstruction technique. CONTRAST:  111m OMNIPAQUE IOHEXOL 300 MG/ML  SOLN COMPARISON:  08/31/2021 FINDINGS: Lower chest: No acute pleural or parenchymal lung disease. Hepatobiliary: Hepatic steatosis. No focal liver abnormality. Cholelithiasis without cholecystitis. Pancreas:  Unremarkable. No pancreatic ductal dilatation or surrounding inflammatory changes. Spleen: Normal in size without focal abnormality. Adrenals/Urinary Tract: Stable appearance of the bilateral kidneys. No urinary tract calculi or obstructive uropathy.  Bladder is moderately distended without filling defect or focal abnormality. The adrenals are stable. Stomach/Bowel: No bowel obstruction or ileus. No bowel wall thickening or inflammatory change. Vascular/Lymphatic: Aortic atherosclerosis. No enlarged abdominal or pelvic lymph nodes. Reproductive: Prostate is unremarkable. Other: No free fluid or free intraperitoneal gas. Fat containing umbilical hernia unchanged. No bowel herniation. Musculoskeletal: No acute or destructive bony lesions. Reconstructed images demonstrate no additional findings. IMPRESSION: 1. Cholelithiasis without evidence of acute cholecystitis. 2. Fat containing umbilical hernia. 3. Otherwise stable appearance of the abdomen and pelvis without acute finding. 4.  Aortic Atherosclerosis (ICD10-I70.0). Electronically Signed   By: Randa Ngo M.D.   On: 09/26/2022 15:05   DG Chest Portable 1 View  Result Date: 09/26/2022 CLINICAL DATA:  Weakness. EXAM: PORTABLE CHEST 1 VIEW COMPARISON:  Chest x-ray February 5, 24. FINDINGS: Chronically elevated right hemidiaphragm. No definite consolidation. No visible pleural effusions or pneumothorax. No evidence of acute osseous abnormality. Similar cardiomediastinal silhouette. CABG and median sternotomy. IMPRESSION: No active disease. Electronically Signed   By: Margaretha Sheffield M.D.   On: 09/26/2022 12:44             LOS: 1 day    Time spent: 50 mins    Emeterio Reeve, DO [facility] 09/27/2022, 2:36 PM    Dictation software may have been used to generate the above note. Typos may occur and escape review in typed/dictated notes. Please contact Dr Sheppard Coil directly for clarity if needed.  Staff may message me via secure  chat in [place]  but this may not receive an immediate response,  please page me for urgent matters!  If 7PM-7AM, please contact night coverage [email or web address]

## 2022-09-27 NOTE — Consult Note (Signed)
Pharmacy Antibiotic Note  Evan [name] is a 67 y.o. male admitted on 09/26/2022 with sepsis.  Pharmacy has been consulted for Vancomycin and Cefepime dosing. No source identified. Pt remains afeb. WBC and lactic acid trending down.   Plan: Patient received a loading of 1750 mg x 1 2/20 PM. Will start vancomycin 1000 mg q12H. Predicted AUC of 489. Goal AUC of 400-600. Scr 0.86, Vd 0.72 (BMI borderlie with good renal function). IBW for CrCl calc. Plan to order vancomycin level after 4th or 5th dose.   Adjust cefepime 2 gm IV Q12H to 2 g q8h due to improvement in renal function.   Height: 5' 5"$  (165.1 cm) Weight: 89 kg (196 lb 3.4 oz) IBW/kg (Calculated) : 61.5  Temp (24hrs), Avg:98 F (36.7 C), Min:97.6 F (36.4 C), Max:98.4 F (36.9 C)  Recent Labs  Lab 09/26/22 1010 09/26/22 1117 09/26/22 1301 09/27/22 0529 09/27/22 0757  WBC 18.5*  --   --  13.2*  --   CREATININE 1.55*  --   --  0.86  --   LATICACIDVEN  --  3.3* 3.0*  --  2.0*     Estimated Creatinine Clearance: 84.3 mL/min (by C-G formula based on SCr of 0.86 mg/dL).    Allergies  Allergen Reactions   Penicillin G Hives   Sulfa Antibiotics Hives   Tiotropium    Zoloft [Sertraline Hcl] Other (See Comments)    Antimicrobials this admission: Vancomycin 2/20 >>  Cefepime 2/20 >>   Dose adjustments this admission: N/A  Microbiology results: 2/20 BCx: pending 2/20 C diff: ordered  2/20 GI panel: ordered   Thank you for allowing pharmacy to be a part of this patient's care.  Oswald Hillock, PharmD, BCPS 09/27/2022 10:10 AM

## 2022-09-28 DIAGNOSIS — I350 Nonrheumatic aortic (valve) stenosis: Secondary | ICD-10-CM

## 2022-09-28 DIAGNOSIS — F200 Paranoid schizophrenia: Secondary | ICD-10-CM | POA: Diagnosis not present

## 2022-09-28 DIAGNOSIS — I5189 Other ill-defined heart diseases: Secondary | ICD-10-CM

## 2022-09-28 DIAGNOSIS — R6510 Systemic inflammatory response syndrome (SIRS) of non-infectious origin without acute organ dysfunction: Secondary | ICD-10-CM | POA: Diagnosis not present

## 2022-09-28 DIAGNOSIS — I700 Atherosclerosis of aorta: Secondary | ICD-10-CM | POA: Diagnosis not present

## 2022-09-28 DIAGNOSIS — I25118 Atherosclerotic heart disease of native coronary artery with other forms of angina pectoris: Secondary | ICD-10-CM

## 2022-09-28 DIAGNOSIS — R579 Shock, unspecified: Secondary | ICD-10-CM | POA: Diagnosis not present

## 2022-09-28 LAB — GLUCOSE, CAPILLARY
Glucose-Capillary: 169 mg/dL — ABNORMAL HIGH (ref 70–99)
Glucose-Capillary: 165 mg/dL — ABNORMAL HIGH (ref 70–99)
Glucose-Capillary: 184 mg/dL — ABNORMAL HIGH (ref 70–99)
Glucose-Capillary: 137 mg/dL — ABNORMAL HIGH (ref 70–99)

## 2022-09-28 LAB — COMPREHENSIVE METABOLIC PANEL
ALT: 30 U/L (ref 0–44)
AST: 59 U/L — ABNORMAL HIGH (ref 15–41)
Albumin: 2.3 g/dL — ABNORMAL LOW (ref 3.5–5.0)
Alkaline Phosphatase: 73 U/L (ref 38–126)
Anion gap: 11 (ref 5–15)
BUN: 15 mg/dL (ref 8–23)
CO2: 18 mmol/L — ABNORMAL LOW (ref 22–32)
Calcium: 8.4 mg/dL — ABNORMAL LOW (ref 8.9–10.3)
Chloride: 108 mmol/L (ref 98–111)
Creatinine, Ser: 0.77 mg/dL (ref 0.61–1.24)
GFR, Estimated: >60 mL/min (ref >60)
Glucose, Bld: 124 mg/dL — ABNORMAL HIGH (ref 70–99)
Potassium: 4 mmol/L (ref 3.5–5.1)
Sodium: 137 mmol/L (ref 135–145)
Total Bilirubin: 1.2 mg/dL (ref 0.3–1.2)
Total Protein: 5.4 g/dL — ABNORMAL LOW (ref 6.5–8.1)

## 2022-09-28 LAB — CBC
HCT: 37.1 % — ABNORMAL LOW (ref 39.0–52.0)
Hemoglobin: 11.7 g/dL — ABNORMAL LOW (ref 13.0–17.0)
MCH: 28.6 pg (ref 26.0–34.0)
MCHC: 31.5 g/dL (ref 30.0–36.0)
MCV: 90.7 fL (ref 80.0–100.0)
Platelets: 396 10*3/uL (ref 150–400)
RBC: 4.09 MIL/uL — ABNORMAL LOW (ref 4.22–5.81)
RDW: 14.9 % (ref 11.5–15.5)
WBC: 13 10*3/uL — ABNORMAL HIGH (ref 4.0–10.5)
nRBC: 0 % (ref 0.0–0.2)

## 2022-09-28 LAB — BRAIN NATRIURETIC PEPTIDE: B Natriuretic Peptide: 988.5 pg/mL — ABNORMAL HIGH (ref 0.0–100.0)

## 2022-09-28 LAB — PHOSPHORUS: Phosphorus: 3 mg/dL (ref 2.5–4.6)

## 2022-09-28 LAB — MAGNESIUM: Magnesium: 2.1 mg/dL (ref 1.7–2.4)

## 2022-09-28 MED ORDER — SODIUM CHLORIDE 0.9 % IV SOLN
2.0000 g | INTRAVENOUS | Status: AC
  Administered 2022-09-29 – 2022-09-30 (×2): 2 g via INTRAVENOUS
  Filled 2022-09-28: qty 20
  Filled 2022-09-28: qty 2

## 2022-09-28 MED ORDER — MIDODRINE HCL 5 MG PO TABS: 10.0000 mg | ORAL_TABLET | Freq: Three times a day (TID) | ORAL | Status: DC

## 2022-09-28 MED ORDER — ENSURE ENLIVE PO LIQD
237.0000 mL | Freq: Two times a day (BID) | ORAL | Status: DC
  Administered 2022-09-28 – 2022-10-06 (×15): 237 mL via ORAL

## 2022-09-28 MED ORDER — ADULT MULTIVITAMIN W/MINERALS CH
1.0000 | ORAL_TABLET | Freq: Every day | ORAL | Status: DC
  Administered 2022-09-29 – 2022-10-06 (×7): 1 via ORAL
  Filled 2022-09-28 (×8): qty 1

## 2022-09-28 MED ORDER — MIDODRINE HCL 5 MG PO TABS
10.0000 mg | ORAL_TABLET | Freq: Three times a day (TID) | ORAL | Status: DC
  Administered 2022-09-28 – 2022-10-01 (×11): 10 mg via ORAL
  Filled 2022-09-28 (×11): qty 2

## 2022-09-28 NOTE — Progress Notes (Signed)
NAME:  Evan [name], MRN:  [id number], DOB:  12/09/1955, LOS: 2 ADMISSION DATE:  09/26/2022, CONSULTATION DATE: 09/27/22 REFERRING MD: Dr. Sheppard Coil, CHIEF COMPLAINT: Dizziness   History of Present Illness:  This is a 67 yo male who presented to [facility] ER on 02/20 via EMS from [facility] with c/o dizziness.  EMS reported he was found on the floor in his room.  Pt reported he was lightheaded while ambulating to the bathroom the morning of 02/20 when he started to feel dizzy and proceeded to lay on the floor.  He endorses having diarrhea for several days prior to ER presentation and dizziness over the past week.  Pt received a 200 ml iv fluid bolus en route to the ER.    ED Course In the ER pt c/o frequent urination which has been off/on for a while.  Pt slightly hypotensive.  Lab results revealed CO2 18/glucose 151/BUN 34/creatinine 1.55/lactic acid 3.3/wbc 18.5/platelets 512.  UA negative for UTI and CXR negative.  CT Head negative for acute intracranial abnormality.  Pt received broad spectrum abx and iv fluids.  He was subsequently admitted to the progressive care unit per hospitalist team for additional workup and treatment.  See detailed hospital course below under significant events.   CT Abd Pelvis: Cholelithiasis without evidence of acute cholecystitis. Fat containing umbilical hernia. Otherwise stable appearance of the abdomen and pelvis without acute finding. Aortic Atherosclerosis (ICD10-I70.0). CT Head:  No acute intracranial abnormality. Chronic small vessel disease and old small vessel infarct of the right thalamus.  Pertinent  Medical History  Anginal Pain  Anxiety Disorder Schizophrenia  Asthma  Atresia of Esophagus with Fistula  CABG CAD Cellulitis  Chronic Diastolic CHF  COPD [id number] GERD GI Bleed Pneumonia  HLD HTN MI Obesity  OSA PVD Panic Disorder  PTSD Diabetic Retinopathy  Stroke  Venous Stasis  Vertigo   Significant Hospital Events: Including  procedures, antibiotic start and stop dates in addition to other pertinent events   02/20: Pt admitted to the progressive care unit for sepsis of unknown etiology although recent hx of diarrhea in outpatient setting  02/21: Pt developed hypotension overnight initially responded to iv fluid resuscitation, however he became hypotensive again requiring transfer to the stepdown unit for levophed gtt. PCCM consulted to assist with management  02/22: Pt remains on peripheral levophed gtt @4$  mcg/min to maintain map goal >65.  Cortisol level 20.2 stress dose steroids initiated   Micro Data:   Blood x2 02/20>>negative  MRSA PCR 02/20>>negative  Resp panel by RT-PCR (RSV, Flu A&B, Covid) 02/20>>negative  GI Panel>> Cdiff PCR>>  Antimicrobials:   Cefepime 02/20>> Vancomycin 02/20>>  Interim History / Subjective:  As outlined above under significant events     Objective   Blood pressure 103/63, pulse 84, temperature 98.1 F (36.7 C), temperature source Oral, resp. rate (!) 22, height 5' 5"$  (1.651 m), weight 84.7 kg, SpO2 94 %.        Intake/Output Summary (Last 24 hours) at 09/28/2022 0824 Last data filed at 09/28/2022 0723 Gross per 24 hour  Intake 4031.95 ml  Output 1450 ml  Net 2581.95 ml   Filed Weights   09/26/22 0945 09/27/22 1206  Weight: 89 kg 84.7 kg    Examination: General: Acutely-ill appearing male, resting in bed NAD  HENT: Supple, no JVD  Lungs: Clear throughout, even, non labored  Cardiovascular: Irregular irregular, no m/r/g, 2+ radial/1+ distal pulses, no edema  Abdomen: +BS x4, obese, soft, non tender, non distended  Extremities: Moves all extremities, normal bulk  Skin: Scattered ecchymosis, cool to touch  Neuro: Alert and following commands, confused to situation at times, PERRLA  GU: Voiding   Resolved Hospital Problem list   Lactic acidosis   Assessment & Plan:   Hypotension secondary to possible sepsis and hypovolemic shock - IV fluid resuscitation and  prn levophed gtt to maintain map >65 - Continue scheduled midodrine and stress dose steroids  - Hold outpatient beta-blocker therapy  - GI and Cdiff panel pending   Elevated troponin likely secondary to demand ischemia    Echo 09/27/22: EF 50 to 55% - Troponin peaked at 63  Chronic atrial fibrillation~rate controlled  Hx: Chronic diastolic CHF, CAD, HTN, HLD, CABG, and PVD  - Continue outpatient aspirin, rivaroxaban, and simvastatin   COPD~stable  Hx: OSA  - Continue scheduled and prn bronchodilator therapy   Acute kidney injury secondary to ATN~resolved   Hypokalemia  - Trend BMP  - Replace electrolytes as indicated  - Monitor UOP  - Avoid nephrotoxic medications  Possible Sepsis of unknown etiology  - Trend WBC and monitor fever curve - Follow cultures - Continue cefepime and vancomycin pending culture results and sensitivities   Type II diabetes mellitus  - CBG's ac/hs - SSI   Schizophrenia  Panic/Anxiety disorder  - Continue outpatient antipsychotics   Best Practice (right click and "Reselect all SmartList Selections" daily)   Diet/type: Mechanical Soft  DVT prophylaxis: Rivaroxaban  GI prophylaxis: PPI Lines: N/A Foley:  N/A Code Status:  full code Last date of multidisciplinary goals of care discussion [09/28/22]  02/22: Pt updated regarding current plan of care  Labs   CBC: Recent Labs  Lab 09/26/22 1010 09/27/22 0529 09/28/22 0620  WBC 18.5* 13.2* 13.0*  HGB 14.3 11.7* 11.7*  HCT 44.7 36.8* 37.1*  MCV 88.7 90.4 90.7  PLT 512* 393 [id number]    Basic Metabolic Panel: Recent Labs  Lab 09/26/22 1010 09/27/22 0529 09/27/22 1937 09/28/22 0620  NA 137 136  --  137  K 3.7 3.4* 3.3* 4.0  CL 104 106  --  108  CO2 18* 20*  --  18*  GLUCOSE 151* 120*  --  124*  BUN 34* 24*  --  15  CREATININE 1.55* 0.86  --  0.77  CALCIUM 9.2 8.3*  --  8.4*  MG  --   --  2.1 2.1  PHOS  --   --  2.9 3.0   GFR: Estimated Creatinine Clearance: 88.5 mL/min (by C-G  formula based on SCr of 0.77 mg/dL). Recent Labs  Lab 09/26/22 1010 09/26/22 1117 09/26/22 1301 09/27/22 0529 09/27/22 0757 09/27/22 1138 09/27/22 1306 09/27/22 1937 09/28/22 0620  PROCALCITON  --   --   --   --   --  0.21  --   --   --   WBC 18.5*  --   --  13.2*  --   --   --   --  13.0*  LATICACIDVEN  --    < > 3.0*  --  2.0*  --  2.1* 1.6  --    < > = values in this interval not displayed.    Liver Function Tests: Recent Labs  Lab 09/27/22 1449 09/28/22 0620  AST 78* 59*  ALT 31 30  ALKPHOS 72 73  BILITOT 0.9 1.2  PROT 5.2* 5.4*  ALBUMIN 2.3* 2.3*   No results for input(s): "LIPASE", "AMYLASE" in the last 168 hours. No results for input(s): "AMMONIA"  in the last 168 hours.  ABG    Component Value Date/Time   HCO3 26.4 09/27/2022 1139   ACIDBASEDEF 0.2 09/27/2022 1139   O2SAT 56.7 09/27/2022 1139     Coagulation Profile: No results for input(s): "INR", "PROTIME" in the last 168 hours.  Cardiac Enzymes: No results for input(s): "CKTOTAL", "CKMB", "CKMBINDEX", "TROPONINI" in the last 168 hours.  HbA1C: HB A1C (BAYER DCA - WAIVED)  Date/Time Value Ref Range Status  11/10/2021 09:30 AM 5.7 (H) 4.8 - 5.6 % Final    Comment:             Prediabetes: 5.7 - 6.4          Diabetes: >6.4          Glycemic control for adults with diabetes: <7.0   08/12/2021 09:29 AM 5.9 (H) 4.8 - 5.6 % Final    Comment:             Prediabetes: 5.7 - 6.4          Diabetes: >6.4          Glycemic control for adults with diabetes: <7.0    Hgb A1c MFr Bld  Date/Time Value Ref Range Status  09/27/2022 05:29 AM 6.0 (H) 4.8 - 5.6 % Final    Comment:    (NOTE) Pre diabetes:          5.7%-6.4%  Diabetes:              >6.4%  Glycemic control for   <7.0% adults with diabetes   12/12/2021 05:21 PM 6.3 (H) 4.8 - 5.6 % Final    Comment:    (NOTE) Pre diabetes:          5.7%-6.4%  Diabetes:              >6.4%  Glycemic control for   <7.0% adults with diabetes      CBG: Recent Labs  Lab 09/27/22 1140 09/27/22 1209 09/27/22 1653 09/27/22 2134 09/28/22 0752  GLUCAP 114* 97 139* 86 169*    Review of Systems: Positives in BOLD   Gen: Denies fever, chills, weight change, fatigue, night sweats HEENT: Denies blurred vision, double vision, hearing loss, tinnitus, sinus congestion, rhinorrhea, sore throat, neck stiffness, dysphagia PULM: Denies shortness of breath, cough, sputum production, hemoptysis, wheezing CV: Denies chest pain, edema, orthopnea, paroxysmal nocturnal dyspnea, palpitations GI: abdominal pain, nausea, vomiting, diarrhea, hematochezia, melena, constipation, change in bowel habits GU: Denies dysuria, hematuria, polyuria, oliguria, urethral discharge Endocrine: Denies hot or cold intolerance, polyuria, polyphagia or appetite change Derm: Denies rash, dry skin, scaling or peeling skin change Heme: Denies easy bruising, bleeding, bleeding gums Neuro: headache, numbness, dizziness/weakness, slurred speech, loss of memory or consciousness  Past Medical History:  He,  has a past medical history of Anginal pain ([place]), Anxiety disorder, Asthma, Atresia of esophagus without fistula, CAD (coronary artery disease), Cellulitis, CHF (congestive heart failure) ([place]), COPD (chronic obstructive pulmonary disease) ([place]), COVID-19, Diabetes mellitus without complication ([place]), Edema, Gastroesophageal reflux, H/O: GI bleed, History of pneumonia, History of scarlet fever, Hyperlipidemia, Hypertension, Myocardial infarction ([place]) (2009), Obesity, Obstructive sleep apnea, Pain, Panic disorder, Peripheral venous insufficiency, PTSD (post-traumatic stress disorder), Retinopathy, Stasis, venous, Stroke ([place]), and Vertigo.   Surgical History:   Past Surgical History:  Procedure Laterality Date   CARDIAC CATHETERIZATION     CATARACT EXTRACTION Left    CATARACT EXTRACTION W/PHACO Right 05/03/2017   Procedure: CATARACT EXTRACTION PHACO AND INTRAOCULAR LENS  PLACEMENT (IOC);  Surgeon: Wallace Going,  Nila Nephew, MD;  Location: [facility] ORS;  Service: Ophthalmology;  Laterality: Right;  [place] 00:35.3 AP% 12.3 CDE 4.33 Fluid Pack lot # [id number]        COLONOSCOPY WITH PROPOFOL N/A 11/26/2020   Procedure: COLONOSCOPY WITH PROPOFOL;  Surgeon: Jonathon Bellows, MD;  Location: [facility] ENDOSCOPY;  Service: Gastroenterology;  Laterality: N/A;  ANNETTE TO PICK UP [phone]   CORONARY ANGIOPLASTY WITH STENT PLACEMENT  2002   CORONARY ANGIOPLASTY WITH STENT PLACEMENT  1999   CORONARY ARTERY BYPASS GRAFT     x7   ESOPHAGOGASTRODUODENOSCOPY N/A 09/19/2016   Procedure: ESOPHAGOGASTRODUODENOSCOPY (EGD);  Surgeon: Lollie Sails, MD;  Location: [facility] ENDOSCOPY;  Service: Endoscopy;  Laterality: N/A;   ESOPHAGOGASTRODUODENOSCOPY (EGD) WITH PROPOFOL  11/26/2020   Procedure: ESOPHAGOGASTRODUODENOSCOPY (EGD) WITH PROPOFOL;  Surgeon: Jonathon Bellows, MD;  Location: [facility] ENDOSCOPY;  Service: Gastroenterology;;     Social History:   reports that he has never smoked. He has never used smokeless tobacco. He reports that he does not drink alcohol and does not use drugs.   Family History:  His family history includes Coronary artery disease in an other family member; Heart attack in his mother; Heart attack (age of onset: 27) in his brother; Hyperlipidemia in his mother; Hypertension in his mother.   Allergies Allergies  Allergen Reactions   Penicillin G Hives   Sulfa Antibiotics Hives   Tiotropium    Zoloft [Sertraline Hcl] Other (See Comments)     Home Medications  Prior to Admission medications   Medication Sig Start Date End Date Taking? Authorizing Provider  albuterol (VENTOLIN HFA) 108 (90 Base) MCG/ACT inhaler Inhale 2 puffs into the lungs every 6 (six) hours as needed for wheezing or shortness of breath. 12/16/21  Yes Clapacs, Madie Reno, MD  aspirin EC 81 MG EC tablet Take 1 tablet (81 mg total) by mouth daily. Swallow whole. 12/17/21  Yes Clapacs, Madie Reno, MD  CEPACOL EXTRA  STRENGTH 15-2.6 MG LOZG SMARTSIG:1 Lozenge(s) By Mouth Every 2 Hours PRN 07/10/22  Yes [provider]  D-5000 125 MCG (5000 UT) TABS Take 1 tablet by mouth daily. 01/09/22  Yes [provider]  empagliflozin (JARDIANCE) 10 MG TABS tablet Take 1 tablet (10 mg total) by mouth daily. 12/16/21  Yes Clapacs, Madie Reno, MD  furosemide (LASIX) 40 MG tablet TAKE 2 TABLETS BY MOUTH ONCE EVERY MORNING AND 1 TABLET ONCE EVERY EVENING 12/16/21  Yes Clapacs, Madie Reno, MD  haloperidol (HALDOL) 1 MG tablet Take 1 mg by mouth at bedtime. 01/30/22  Yes [provider]  magnesium hydroxide (MILK OF MAGNESIA) 400 MG/5ML suspension Take 5 mLs by mouth daily as needed for mild constipation.   Yes [provider]  metoprolol succinate (TOPROL-XL) 50 MG 24 hr tablet Take 1 tablet (50 mg total) by mouth daily. 12/17/21  Yes Clapacs, Madie Reno, MD  MUCINEX MAXIMUM STRENGTH 1200 MG TB12 Take 1 tablet by mouth 2 (two) times daily. 07/10/22  Yes [provider]  nitroGLYCERIN (NITROSTAT) 0.4 MG SL tablet Place 1 tablet (0.4 mg total) under the tongue every 5 (five) minutes as needed for chest pain. 12/16/21  Yes Clapacs, Madie Reno, MD  nystatin cream (MYCOSTATIN) Apply topically 2 (two) times daily. 08/28/22  Yes [provider]  pantoprazole (PROTONIX) 40 MG tablet Take 1 tablet (40 mg total) by mouth daily. 12/16/21  Yes Clapacs, Madie Reno, MD  potassium chloride SA (KLOR-CON M) 20 MEQ tablet Take 40 mEq by mouth daily. 08/02/22  Yes [provider]  psyllium (HYDROCIL/METAMUCIL) 95 % PACK Take 1 packet by mouth daily. 12/17/21  Yes Clapacs, Madie Reno, MD  rivaroxaban (XARELTO) 20 MG TABS tablet Take 1 tablet (20 mg total) by mouth daily with supper. 12/16/21  Yes Clapacs, Madie Reno, MD  Saccharomyces boulardii (PROBIOTIC) 250 MG CAPS TAKE 1 CAPSULE BY MOUTH EVERY MORNING AND AT BEDTIME 11/22/21  Yes [name], [name] T, NP  Sennosides (SENNA) 8.6 MG CAPS Take 2 capsules by mouth daily. 01/30/22  Yes  [provider]  simvastatin (ZOCOR) 20 MG tablet Take 20 mg by mouth at bedtime. 05/18/22  Yes [provider]  SYMBICORT 80-4.5 MCG/ACT inhaler INHALE 2 PUFFS BY MOUTH 2 TIMES DAILY. RINSE MOUTH AFTER USE. 11/17/21  Yes [name], [name] T, NP  triamcinolone cream (KENALOG) 0.1 % Apply topically. 05/15/22  Yes [provider]  TYLENOL 500 MG tablet Take 500 mg by mouth 3 (three) times daily as needed. 07/10/22  Yes [provider]  vitamin B-12 (CYANOCOBALAMIN) 1000 MCG tablet Take 1 tablet (1,000 mcg total) by mouth daily. 12/16/21  Yes Clapacs, Madie Reno, MD  ZYRTEC ALLERGY 10 MG tablet Take 10 mg by mouth at bedtime. 05/15/22  Yes [provider]  busPIRone (BUSPAR) 5 MG tablet Take 1 tablet (5 mg total) by mouth 2 (two) times daily. Patient not taking: Reported on 09/26/2022 12/16/21   Clapacs, Madie Reno, MD  ezetimibe (ZETIA) 10 MG tablet Take 1 tablet (10 mg total) by mouth daily. Patient not taking: Reported on 09/26/2022 12/16/21   Clapacs, Madie Reno, MD  olmesartan (BENICAR) 5 MG tablet Take 1 tablet (5 mg total) by mouth daily. Patient not taking: Reported on 09/26/2022 12/16/21   Clapacs, Madie Reno, MD  risperiDONE (RISPERDAL) 0.5 MG tablet Take 1 tablet (0.5 mg total) by mouth 2 (two) times daily. Patient taking differently: Take 1 mg by mouth 2 (two) times daily. 12/16/21   Clapacs, Madie Reno, MD  rosuvastatin (CRESTOR) 5 MG tablet Take 1 tablet (5 mg total) by mouth daily. 12/17/21   Clapacs, Madie Reno, MD     Critical care time: 74 minutes      Donell Beers, [place] Pager [phone] (please enter 7 digits) PCCM Consult Pager [phone] (please enter 7 digits)

## 2022-09-28 NOTE — Progress Notes (Signed)
Pt no longer requiring levophed gtt.  PCCM team will sign off if you need assistance please call on call pager# listed in [place].  Donell Beers, [place] Pager [phone] (please enter 7 digits) PCCM Consult Pager [phone] (please enter 7 digits)

## 2022-09-28 NOTE — Evaluation (Signed)
Physical Therapy Evaluation Patient Details Name: Evan [name] MRN: [id number] DOB: [birth year] Today's Date: 09/28/2022  History of Present Illness  Pt is a 67 year old male admitted with SIRS (systemic inflammatory response syndrome) (HCC) / Severe sepsis unknown source, AMS; PMH significant for hyperlipidemia, paranoid schizophrenia, CAD, non-insulin-dependent diabetes mellitus, hypertension, obesity, diabetic retinopathy  Clinical Impression  Pt showed interest in working with PT but was ultimately very limited.  Attempts at light exercises with LEs lead to c/o pain with almost no PROM at hips and especially knees (contracted to ~15* from flexion b/l) and poor overall tolerance to activity.  Heavy assist to get to sitting and unable to attain upright with R bias and lean, along with poor tolerance - cues for positioning, UE use, attempts to weight shift all met with minimal success.  Pt pleasant, motivated but ultimately very functionally limited, will benefit from continued PT to address these limitaions.        Recommendations for follow up therapy are one component of a multi-disciplinary discharge planning process, led by the attending physician.  Recommendations may be updated based on patient status, additional functional criteria and insurance authorization.  Follow Up Recommendations Skilled nursing-short term rehab (<3 hours/day) Can patient physically be transported by private vehicle: No    Assistance Recommended at Discharge Frequent or constant Supervision/Assistance  Patient can return home with the following  Two people to help with walking and/or transfers;Two people to help with bathing/dressing/bathroom;Assist for transportation    Equipment Recommendations None recommended by PT  Recommendations for Other Services       Functional Status Assessment Patient has had a recent decline in their functional status and demonstrates the ability to make significant  improvements in function in a reasonable and predictable amount of time.     Precautions / Restrictions Precautions Precautions: Fall Restrictions Weight Bearing Restrictions: No      Mobility  Bed Mobility Overal bed mobility: Needs Assistance Bed Mobility: Sit to Supine, Supine to Sit     Supine to sit: Max assist Sit to supine: Max assist   General bed mobility comments: Pt showed some effort with trying to get toward EOB, but ultimately very limited and unable to initiate any real movement needing max assist to get to sitting, heavy assist to stay upright and max assist again to return to supine    Transfers                   General transfer comment: not appropriate to try standing this date, pt needing heavy assist  just to maintain sitting EOB    Ambulation/Gait                  Stairs            Wheelchair Mobility    Modified Rankin (Stroke Patients Only)       Balance Overall balance assessment: Needs assistance Sitting-balance support: Single extremity supported Sitting balance-Leahy Scale: Poor Sitting balance - Comments: pt leaning heavily to the R, using R UE to brace himself.  Despite much assist, unable to maintain close to neutral upright sitting                                     Pertinent Vitals/Pain Pain Assessment Pain Assessment:  (pt recoils in pain with even modest attempts at LE movement (P/AROM both))    Home Living Family/patient  expects to be discharged to:: Assisted living (Per chart pt is a resident at [facility])                 Home Equipment: Conservation officer, nature (2 wheels);Wheelchair - manual Additional Comments: pt reports he lives in assisted living, was walking with RW prior to admission but also requiring use of mwc; poor historian    Prior Function Prior Level of Function : Independent/Modified Independent             Mobility Comments: Pt reports amb with RW approx 2  weeks ago however ?historian ADLs Comments: assist for ADLs- dressing, bathing, grooming depending on day per pt report. Pt reports he was able to feed himself. Assist for all IADLs     Hand Dominance   Dominant Hand: Right    Extremity/Trunk Assessment   Upper Extremity Assessment Upper Extremity Assessment: Generalized weakness (AROM L shld elevation to ~90, R to ~ 80. R elbow ext and grip grossly 4-/5, R elbow flex, L elbow ext and grip grossly 3+/5, L elbow flexion 3/5)    Lower Extremity Assessment Lower Extremity Assessment: Generalized weakness (significant limitations with knees contracted lacking ~15 deg from TKE b/l and limited knee flexion tolerance - calling out in pain with even minimal attempts at PROM, no AROM at ankles with limited PROM tolerance)    Cervical / Trunk Assessment Cervical / Trunk Assessment:  (R rotation)  Communication   Communication: No difficulties  Cognition Arousal/Alertness: Awake/alert Behavior During Therapy: WFL for tasks assessed/performed Overall Cognitive Status: Difficult to assess                                 General Comments: Pt alert to self, date, setting but reports recent ability to be mobile and active which seems suspect given his contractures/weakness/mobility        General Comments      Exercises     Assessment/Plan    PT Assessment Patient needs continued PT services  PT Problem List Decreased strength;Decreased range of motion;Decreased activity tolerance;Decreased balance;Decreased safety awareness;Decreased knowledge of use of DME;Decreased mobility       PT Treatment Interventions DME instruction;Gait training;Functional mobility training;Therapeutic activities;Therapeutic exercise;Balance training;Neuromuscular re-education;Patient/family education    PT Goals (Current goals can be found in the Care Plan section)  Acute Rehab PT Goals Patient Stated Goal: Go to a different facility PT Goal  Formulation: With patient Time For Goal Achievement: 10/11/22 Potential to Achieve Goals: Fair    Frequency Min 2X/week     Co-evaluation               AM-PAC PT "6 Clicks" Mobility  Outcome Measure Help needed turning from your back to your side while in a flat bed without using bedrails?: A Lot Help needed moving from lying on your back to sitting on the side of a flat bed without using bedrails?: A Lot Help needed moving to and from a bed to a chair (including a wheelchair)?: Total Help needed standing up from a chair using your arms (e.g., wheelchair or bedside chair)?: Total Help needed to walk in hospital room?: Total Help needed climbing 3-5 steps with a railing? : Total 6 Click Score: 8    End of Session   Activity Tolerance: Patient limited by fatigue;Patient limited by pain Patient left: with call bell/phone within reach;with nursing/sitter in room Nurse Communication: Mobility status PT Visit Diagnosis: Muscle weakness (  generalized) (M62.81);Difficulty in walking, not elsewhere classified (R26.2);Pain;Unsteadiness on feet (R26.81)    Time: [id number] PT Time Calculation (min) (ACUTE ONLY): 21 min   Charges:   PT Evaluation $PT Eval Low Complexity: 1 Low PT Treatments $Therapeutic Activity: 8-22 mins        Kreg Shropshire, DPT 09/28/2022, 3:19 PM

## 2022-09-28 NOTE — Progress Notes (Signed)
Initial Nutrition Assessment  DOCUMENTATION CODES:   Obesity unspecified  INTERVENTION:   Ensure Enlive po BID, each supplement provides 350 kcal and 20 grams of protein.  MVI po daily   Dysphagia 3 diet   Daily weights   NUTRITION DIAGNOSIS:   Increased nutrient needs related to acute illness as evidenced by estimated needs.  GOAL:   Patient will meet greater than or equal to 90% of their needs  MONITOR:   PO intake, Supplement acceptance, Labs, Weight trends, I & O's, Skin  REASON FOR ASSESSMENT:   Malnutrition Screening Tool    ASSESSMENT:   67 y/o male with h/o HTN, DM, PTSD, anxiety, CHF, schizophrenia, resident of a group home, OSA, HLD, esophageal motility, PAF, GERD, CAD s/p CABG, prostate cancer and CVA who is admitted with hypotension secondary to possible sepsis and hypovolemic shock.  Met with pt in room today. Pt reports that he is feeling bad today; pt's main compliant is that his legs are numb. Pt reports good appetite and oral intake at baseline. Pt reports eating a few bites of breakfast this morning but per RN report, pt ate 100%. Per chart review, pt is on a mechanical soft diet at baseline. RD discussed with pt the importance of adequate nutrition needed to preserve lean muscle. Pt is willing to drink strawberry supplements in hospital. RD will add supplements and MVI to help pt meet his estimated needs.   Per chart, pt is down 16lbs(8%) over the past 7 months; this is not significant.   Medications reviewed and include: aspirin, solu-cortef, insulin, protonix, senokot, cefepime, levophed, ceftriaxone   Labs reviewed: K 4.0 wnl, P 3.0 wnl, Mg 2.1 wnl BNP- 988.5(H) Wbc- 13.0(H) Cbgs- 165, 169 x 24 hrs  AIC 6.0(H)- 2/21  NUTRITION - FOCUSED PHYSICAL EXAM:  Flowsheet Row Most Recent Value  Orbital Region No depletion  Upper Arm Region No depletion  Thoracic and Lumbar Region No depletion  Buccal Region No depletion  Temple Region No depletion   Clavicle Bone Region No depletion  Clavicle and Acromion Bone Region No depletion  Scapular Bone Region No depletion  Dorsal Hand No depletion  Patellar Region Mild depletion  Anterior Thigh Region Mild depletion  Posterior Calf Region Mild depletion  Edema (RD Assessment) None  Hair Reviewed  Eyes Reviewed  Mouth Reviewed  Skin Reviewed  Nails Reviewed   Diet Order:   Diet Order             Diet heart healthy/carb modified Fluid consistency: Thin  Diet effective now                  EDUCATION NEEDS:   Education needs have been addressed  Skin:  Skin Assessment: Reviewed RN Assessment (ecchymosis)  Last BM:  pta  Height:   Ht Readings from Last 1 Encounters:  09/27/22 5' 5"$  (1.651 m)    Weight:   Wt Readings from Last 1 Encounters:  09/27/22 84.7 kg    Ideal Body Weight:  61.8 kg  BMI:  Body mass index is 31.07 kg/m.  Estimated Nutritional Needs:   Kcal:  1700-2000kcal/day  Protein:  85-100g/day  Fluid:  1.7-1.9L/day  Koleen Distance MS, RD, LDN Please refer to [facility] for RD and/or RD on-call/weekend/after hours pager

## 2022-09-28 NOTE — Progress Notes (Signed)
PROGRESS NOTE    Evan [name]   [id number] DOB: [birth year]  DOA: 09/26/2022 Date of Service: 09/28/22 PCP: [name], FNP     Brief Narrative / Hospital Course:  Evan [name] is a 67 year old male with history of hyperlipidemia, paranoid schizophrenia, CAD, non-insulin-dependent diabetes mellitus, hypertension, obesity, diabetic retinopathy, who presents emergency department for chief concerns of dizziness from [facility] via EMS. 02/20: afebrile, RR 21, tachycardic at HR 120, blood pressure 101/72, SpO2 94% on room air. Serum creatinine 1.55, EGFR 48, WBC 18.5, hemoglobin 14.3, platelets 512, Lactic acid was 3.3 and on repeat was 3.0. COVID/influenza A/influenza B/RSV PCR were negative. UA was negative for leukocytes and nitrates. Blood cultures x 2 have been ordered and are in process.CT abdomen pelvis with contrast: Was read as cholelithiasis without evidence of acute cholecystitis.  Fat-containing umbilical hernia.  Otherwise stable appearance of the abdomen pelvis without acute finding.  Aortic atherosclerosis. ED treatment: Cefepime and vancomycin per pharmacy.  LR 1 L bolus. Admitted to hospitalist service for sepsis unknown source. 02/21: hypotensive overnight, responsive to fluid bolus, BP still low in AM, started midodrine and gave another 1L bolus, BP held briefly --> pt transferred to stepdown for norepi gtt. C/o chest pressure, (+)murmur on cardiac exam, ?rales/atelectasis --> Echo, BNP elevated at 428, CXR, Troponin, EKG. Eval acidosis --> VBG was ok 02/22: BP improving, off norepi approx 10:00 am, scheduled midodrine and stress dose steroids (Cortisol level 20).  BCx NGx2d, GI PCR and CDiff pending, repeat BNP higher at 988, lactic and WBC coming down. Echo LVEF 50-55% w/ low normal fxn, mild LVH, indeterminate diastolic, moderate hypokinesis inferolateral LV, moderate aortic valve stenosis. Net IO Since Admission: 3,207.89 mL [09/28/22 1440]    Consultants:   none  Procedures: none      ASSESSMENT & PLAN:   Principal Problem:   SIRS (systemic inflammatory response syndrome) (HCC) Active Problems:   Hypertension associated with diabetes (HCC)   Esophageal dysmotility   Chronic diastolic CHF (congestive heart failure) (HCC)   Paranoid schizophrenia (HCC)   Type 2 diabetes mellitus with morbid obesity (HCC)   Obstructive sleep apnea on CPAP   Hyperlipidemia associated with type 2 diabetes mellitus (HCC)   Persistent atrial fibrillation (HCC)   CAD (coronary artery disease)   Hoarding disorder   COPD, mild (HCC)   Atherosclerosis of aorta (HCC)   History of CVA (cerebrovascular accident)   Leukocytosis   AKI (acute kidney injury) ([place])   Shock circulatory (HCC)   Elevated lactic acid level   Left ventricular hypokinesis inferolateral wall   Moderate aortic stenosis   Hypotension - improved  SIRS (systemic inflammatory response syndrome) (HCC) / Severe sepsis unknown source Altered mental status - improved  Etiology workup in progress status post cefepime, vancomycin per pharmacy and LR total 3 L boluses Eval acidosis --> VBG no concerns  Continue with cefepime and vancomycin per pharmacy today 09/28/22 is Day 3 abx Follow BCx Levophed gtt in SDU - improved on this, off as of 10:00 this morning  ICU team following   Cardiogenic shock less likely  EKG appears c/w yesterday, P waves difficult to appreciate but rhythm appears regular Tropes very slight elevation, trend down, likely demand ischemia / hypovolemia  CXR WNL BNP slightly elevated 428, hold further fluids for now --> recheck today 988 and (+)aortic stenosis --> diurese if BP tolerates TSH pending  Continue scheduled midodrine and stress dose steroids initiated by ICU team, appreciate help  Procal 0.21  Left ventricular hypokinesis inferolateral wall Moderate aortic stenosis Consider cardiology consult if worse, otherwise should be able to follow outpatient   Consider Lasix if BP tolerates  Strict I&O  Tremor, question parkinsonism (primary vs med effect) Pt reprots numbness lower extremities and "can't move" but then able to move somewhat, question neuro sensory/motor deficit Neurology consult   AKI (acute kidney injury) ([place]) - resolved Monitor BMP   Leukocytosis Etiology workup in progress, treat per above Monitor CBC   History of CVA (cerebrovascular accident) Resumed home aspirin 81 mg daily, simvastatin 20 mg nightly  Essential Hypertension Holding home metoprolol succinate 50 mg daily at this time due to possible sepsis/severe sepsis presentation Hydralazine 5 mg IV every 8 hours as needed for SBP > 180, 4 days ordered   COPD, mild ([place]) Not in acute exacerbation at this time Albuterol nebulizer, every 6 hours as needed for wheezing and shortness of breath, 5 days ordered   Hyperlipidemia associated with type 2 diabetes mellitus (HCC) Simvastatin 20 mg nightly resumed   Paranoid schizophrenia ([place]) Resumed home risperidone 0.5 mg p.o. twice daily, Haldol 1 mg nightly   History Atrial Fibrillation Continue Xarelto  Beta blocker on hold d/t hypotension    DVT prophylaxis: Xarelto Pertinent IV fluids/nutrition: LR bolus as above, continuous IV fluids following resuscitation Central lines / invasive devices: none  Code Status: FULL CODE   Current Admission Status: inpatient  TOC needs / Dispo plan: none at this time  Barriers to discharge / significant pending items: await cultures and clinical improvement              Subjective / Brief ROS:  when asked how he is he states "I'm ok," when asked if in any pain he says "no" tand if anything bothering him he says "not" but then cannot seem to comprehend where he is or why he is in the hospital. He notes "I can't move my legs" but he will move them in bed very slightly and is able to push toes against resistance, I ask if legs hurt he says "yes" but when asked  where he says "I'm not sure" and will say "there" anywhere on the legs I point and ask about pain.     Family Communication: left voicemail 09/28/22 2:45 PM for [employer] contact on file     Objective Findings:  Vitals:   09/28/22 1015 09/28/22 1100 09/28/22 1200 09/28/22 1300  BP: 112/66 94/82 118/75 113/69  Pulse: (!) 102 (!) 107 (!) 102 99  Resp: 18 18 (!) 23 20  Temp:      TempSrc:      SpO2: 97% 97% 99% 98%  Weight:      Height:        Intake/Output Summary (Last 24 hours) at 09/28/2022 1440 Last data filed at 09/28/2022 1027 Gross per 24 hour  Intake 3830.41 ml  Output 1450 ml  Net 2380.41 ml   Filed Weights   09/26/22 0945 09/27/22 1206  Weight: 89 kg 84.7 kg    Examination:  Physical Exam Constitutional:      General: He is not in acute distress.    Appearance: Normal appearance. He is not toxic-appearing.  HENT:     Nose:     Comments: Reports some epistaxis, has dried blood scabs in nares no active bleeding    Mouth/Throat:     Mouth: Mucous membranes are dry.  Eyes:     Extraocular Movements: Extraocular movements intact.  Cardiovascular:  Rate and Rhythm: Normal rate and regular rhythm.     Heart sounds: Murmur heard.  Pulmonary:     Effort: No respiratory distress.     Breath sounds: No rales (faint bilateral bases).  Abdominal:     General: Abdomen is flat.     Palpations: Abdomen is soft.     Tenderness: There is no abdominal tenderness.  Musculoskeletal:     Right lower leg: No edema.     Left lower leg: No edema.  Skin:    General: Skin is dry.     Coloration: Skin is pale.  Neurological:     General: No focal deficit present.     Mental Status: He is alert. He is disoriented.     Cranial Nerves: No cranial nerve deficit.     Sensory: Sensory deficit (questionable bilateral legs - reports numbness) present.     Motor: Weakness present.  Psychiatric:        Mood and Affect: Mood normal.        Behavior: Behavior normal.           Scheduled Medications:   aspirin EC  81 mg Oral Daily   Chlorhexidine Gluconate Cloth  6 each Topical Daily   feeding supplement  237 mL Oral BID BM   haloperidol  1 mg Oral QHS   hydrocortisone sod succinate (SOLU-CORTEF) inj  100 mg Intravenous Q12H   insulin aspart  0-5 Units Subcutaneous QHS   insulin aspart  0-9 Units Subcutaneous TID WC   midodrine  10 mg Oral TID WC   mometasone-formoterol  2 puff Inhalation BID   [START ON 09/29/2022] multivitamin with minerals  1 tablet Oral Daily   pantoprazole  40 mg Oral QAC breakfast   risperiDONE  0.5 mg Oral BID   rivaroxaban  20 mg Oral Q supper   senna  2 tablet Oral QHS   simvastatin  20 mg Oral QHS    Continuous Infusions:  sodium chloride Stopped (09/27/22 1211)   ceFEPime (MAXIPIME) IV Stopped (09/28/22 [id number])   [START ON 09/29/2022] cefTRIAXone (ROCEPHIN)  IV     norepinephrine (LEVOPHED) Adult infusion Stopped (09/28/22 0948)    PRN Medications:  acetaminophen **OR** acetaminophen, albuterol, melatonin, menthol-cetylpyridinium, nitroGLYCERIN, nystatin cream, ondansetron **OR** ondansetron (ZOFRAN) IV, senna-docusate  Antimicrobials from admission:  Anti-infectives (From admission, onward)    Start     Dose/Rate Route Frequency Ordered Stop   09/29/22 1000  cefTRIAXone (ROCEPHIN) 2 g in sodium chloride 0.9 % 100 mL IVPB        2 g 200 mL/hr over 30 Minutes Intravenous Every 24 hours 09/28/22 1110 10/01/22 0959   09/27/22 1100  vancomycin (VANCOCIN) IVPB 1000 mg/200 mL premix  Status:  Discontinued        1,000 mg 200 mL/hr over 60 Minutes Intravenous Every 12 hours 09/27/22 1025 09/28/22 1108   09/27/22 1015  ceFEPIme (MAXIPIME) 2 g in sodium chloride 0.9 % 100 mL IVPB        2 g 200 mL/hr over 30 Minutes Intravenous Every 8 hours 09/27/22 1012 09/28/22 2359   09/27/22 0030  ceFEPIme (MAXIPIME) 2 g in sodium chloride 0.9 % 100 mL IVPB  Status:  Discontinued        2 g 200 mL/hr over 30 Minutes  Intravenous Every 12 hours 09/26/22 1647 09/27/22 1012   09/26/22 1630  vancomycin (VANCOREADY) IVPB 750 mg/150 mL        750 mg 150 mL/hr over 60 Minutes Intravenous  Once 09/26/22 1546 09/26/22 1931   09/26/22 1230  ceFEPIme (MAXIPIME) 2 g in sodium chloride 0.9 % 100 mL IVPB        2 g 200 mL/hr over 30 Minutes Intravenous  Once 09/26/22 1220 09/26/22 1302   09/26/22 1230  vancomycin (VANCOCIN) IVPB 1000 mg/200 mL premix        1,000 mg 200 mL/hr over 60 Minutes Intravenous  Once 09/26/22 1220 09/26/22 1410           Data Reviewed:  I have personally reviewed the following...  CBC: Recent Labs  Lab 09/26/22 1010 09/27/22 0529 09/28/22 0620  WBC 18.5* 13.2* 13.0*  HGB 14.3 11.7* 11.7*  HCT 44.7 36.8* 37.1*  MCV 88.7 90.4 90.7  PLT 512* 393 [id number]   Basic Metabolic Panel: Recent Labs  Lab 09/26/22 1010 09/27/22 0529 09/27/22 1937 09/28/22 0620  NA 137 136  --  137  K 3.7 3.4* 3.3* 4.0  CL 104 106  --  108  CO2 18* 20*  --  18*  GLUCOSE 151* 120*  --  124*  BUN 34* 24*  --  15  CREATININE 1.55* 0.86  --  0.77  CALCIUM 9.2 8.3*  --  8.4*  MG  --   --  2.1 2.1  PHOS  --   --  2.9 3.0   GFR: Estimated Creatinine Clearance: 88.5 mL/min (by C-G formula based on SCr of 0.77 mg/dL). Liver Function Tests: Recent Labs  Lab 09/27/22 1449 09/28/22 0620  AST 78* 59*  ALT 31 30  ALKPHOS 72 73  BILITOT 0.9 1.2  PROT 5.2* 5.4*  ALBUMIN 2.3* 2.3*   No results for input(s): "LIPASE", "AMYLASE" in the last 168 hours. No results for input(s): "AMMONIA" in the last 168 hours. Coagulation Profile: No results for input(s): "INR", "PROTIME" in the last 168 hours. Cardiac Enzymes: No results for input(s): "CKTOTAL", "CKMB", "CKMBINDEX", "TROPONINI" in the last 168 hours. BNP (last 3 results) No results for input(s): "PROBNP" in the last 8760 hours. HbA1C: Recent Labs    09/27/22 0529  HGBA1C 6.0*   CBG: Recent Labs  Lab 09/27/22 1209 09/27/22 1653  09/27/22 2134 09/28/22 0752 09/28/22 1201  GLUCAP 97 139* 86 169* 165*   Lipid Profile: No results for input(s): "CHOL", "HDL", "LDLCALC", "TRIG", "CHOLHDL", "LDLDIRECT" in the last 72 hours. Thyroid Function Tests: No results for input(s): "TSH", "T4TOTAL", "FREET4", "T3FREE", "THYROIDAB" in the last 72 hours. Anemia Panel: No results for input(s): "VITAMINB12", "FOLATE", "FERRITIN", "TIBC", "IRON", "RETICCTPCT" in the last 72 hours. Most Recent Urinalysis On File:     Component Value Date/Time   COLORURINE YELLOW (A) 09/26/2022 1117   APPEARANCEUR HAZY (A) 09/26/2022 1117   APPEARANCEUR Clear 09/20/2021 0856   LABSPEC 1.013 09/26/2022 1117   LABSPEC 1.013 11/29/2014 1120   PHURINE 5.0 09/26/2022 1117   GLUCOSEU >=500 (A) 09/26/2022 1117   GLUCOSEU Negative 11/29/2014 1120   HGBUR LARGE (A) 09/26/2022 1117   BILIRUBINUR NEGATIVE 09/26/2022 1117   BILIRUBINUR Negative 09/20/2021 0856   BILIRUBINUR Negative 11/29/2014 1120   KETONESUR NEGATIVE 09/26/2022 1117   PROTEINUR 30 (A) 09/26/2022 1117   NITRITE NEGATIVE 09/26/2022 1117   LEUKOCYTESUR NEGATIVE 09/26/2022 1117   LEUKOCYTESUR Negative 11/29/2014 1120   Sepsis Labs: @LABRCNTIP$ (procalcitonin:4,lacticidven:4) Microbiology: Recent Results (from the past 240 hour(s))  Resp panel by RT-PCR (RSV, Flu A&B, Covid) Anterior Nasal Swab     Status: None   Collection Time: 09/26/22 10:03 AM   Specimen: Anterior Nasal Swab  Result Value Ref Range Status   SARS Coronavirus 2 by RT PCR NEGATIVE NEGATIVE Final    Comment: (NOTE) SARS-CoV-2 target nucleic acids are NOT DETECTED.  The SARS-CoV-2 RNA is generally detectable in upper respiratory specimens during the acute phase of infection. The lowest concentration of SARS-CoV-2 viral copies this assay can detect is 138 copies/mL. A negative result does not preclude SARS-Cov-2 infection and should not be used as the sole basis for treatment or other patient management decisions. A  negative result may occur with  improper specimen collection/handling, submission of specimen other than nasopharyngeal swab, presence of viral mutation(s) within the areas targeted by this assay, and inadequate number of viral copies(<138 copies/mL). A negative result must be combined with clinical observations, patient history, and epidemiological information. The expected result is Negative.  Fact Sheet for Patients:  EntrepreneurPulse.com.au  Fact Sheet for Healthcare Providers:  IncredibleEmployment.be  This test is no t yet approved or cleared by the [place] FDA and  has been authorized for detection and/or diagnosis of SARS-CoV-2 by FDA under an Emergency Use Authorization (EUA). This EUA will remain  in effect (meaning this test can be used) for the duration of the COVID-19 declaration under Section 564(b)(1) of the Act, 21 U.S.C.section 360bbb-3(b)(1), unless the authorization is terminated  or revoked sooner.       Influenza A by PCR NEGATIVE NEGATIVE Final   Influenza B by PCR NEGATIVE NEGATIVE Final    Comment: (NOTE) The Xpert Xpress SARS-CoV-2/FLU/RSV plus assay is intended as an aid in the diagnosis of influenza from Nasopharyngeal swab specimens and should not be used as a sole basis for treatment. Nasal washings and aspirates are unacceptable for Xpert Xpress SARS-CoV-2/FLU/RSV testing.  Fact Sheet for Patients: EntrepreneurPulse.com.au  Fact Sheet for Healthcare Providers: IncredibleEmployment.be  This test is not yet approved or cleared by the [place] FDA and has been authorized for detection and/or diagnosis of SARS-CoV-2 by FDA under an Emergency Use Authorization (EUA). This EUA will remain in effect (meaning this test can be used) for the duration of the COVID-19 declaration under Section 564(b)(1) of the Act, 21 U.S.C. section 360bbb-3(b)(1), unless the authorization  is terminated or revoked.     Resp Syncytial Virus by PCR NEGATIVE NEGATIVE Final    Comment: (NOTE) Fact Sheet for Patients: EntrepreneurPulse.com.au  Fact Sheet for Healthcare Providers: IncredibleEmployment.be  This test is not yet approved or cleared by the [place] FDA and has been authorized for detection and/or diagnosis of SARS-CoV-2 by FDA under an Emergency Use Authorization (EUA). This EUA will remain in effect (meaning this test can be used) for the duration of the COVID-19 declaration under Section 564(b)(1) of the Act, 21 U.S.C. section 360bbb-3(b)(1), unless the authorization is terminated or revoked.  Performed at [facility], [address] [place], [place] [address]   Blood Culture (routine x 2)     Status: None (Preliminary result)   Collection Time: 09/26/22 11:17 AM   Specimen: BLOOD RIGHT ARM  Result Value Ref Range Status   Specimen Description BLOOD RIGHT ARM  Final   Special Requests   Final    BOTTLES DRAWN AEROBIC AND ANAEROBIC Blood Culture results may not be optimal due to an excessive volume of blood received in culture bottles   Culture   Final    NO GROWTH 2 DAYS Performed at [facility], [address] [place], [place] [address]    Report Status PENDING  Incomplete  Blood Culture (routine x  2)     Status: None (Preliminary result)   Collection Time: 09/26/22 11:17 AM   Specimen: BLOOD LEFT ARM  Result Value Ref Range Status   Specimen Description BLOOD LEFT ARM  Final   Special Requests   Final    BOTTLES DRAWN AEROBIC AND ANAEROBIC Blood Culture results may not be optimal due to an excessive volume of blood received in culture bottles   Culture   Final    NO GROWTH 2 DAYS Performed at [facility], [address], [place], [place] [address]    Report Status PENDING  Incomplete  MRSA Next Gen by PCR, Nasal     Status: None   Collection Time: 09/27/22 12:08 PM    Specimen: Nasal Mucosa; Nasal Swab  Result Value Ref Range Status   MRSA by PCR Next Gen NOT DETECTED NOT DETECTED Final    Comment: (NOTE) The GeneXpert MRSA Assay (FDA approved for NASAL specimens only), is one component of a comprehensive MRSA colonization surveillance program. It is not intended to diagnose MRSA infection nor to guide or monitor treatment for MRSA infections. Test performance is not FDA approved in patients less than 65 years old. Performed at [facility], [address], [place], [place] [address]       Radiology Studies last 3 days: ECHOCARDIOGRAM COMPLETE  Result Date: 09/27/2022    ECHOCARDIOGRAM REPORT   Patient Name:   Evan [name] Date of Exam: 09/27/2022 Medical Rec #:  [id number]        Height:       65.0 in Accession #:    [id number]       Weight:       186.7 lb Date of Birth:  [birth year]         BSA:          1.921 m Patient Age:    65 years         BP:           82/52 mmHg Patient Gender: M                HR:           67 bpm. Exam Location:  [facility] Procedure: 2D Echo, Color Doppler and Cardiac Doppler Indications:     Murmur R01.1  History:         Patient has prior history of Echocardiogram examinations, most                  recent 11/07/2020. CAD and Previous Myocardial Infarction, COPD;                  Risk Factors:Diabetes.  Sonographer:     Sherrie Sport Referring Phys:  [id number] Emeterio Reeve Diagnosing Phys: Kate Sable MD IMPRESSIONS  1. Left ventricular ejection fraction, by estimation, is 50 to 55%. The left ventricle has low normal function. The left ventricle has no regional wall motion abnormalities. There is mild left ventricular hypertrophy. Left ventricular diastolic parameters are indeterminate. There is moderate hypokinesis of the left ventricular, entire inferolateral wall.  2. Right ventricular systolic function is normal. The right ventricular size is normal.  3. Left atrial size was moderately dilated.  4. The mitral valve is  normal in structure. Mild mitral valve regurgitation.  5. Mean aortic valve gradient 47mHg, PG 310mg, Vmax 73m573m DVI=0.29. The aortic valve is calcified. Aortic valve regurgitation is mild. Moderate aortic valve stenosis. FINDINGS  Left Ventricle: Left ventricular ejection fraction, by  estimation, is 50 to 55%. The left ventricle has low normal function. The left ventricle has no regional wall motion abnormalities. Moderate hypokinesis of the left ventricular, entire inferolateral  wall. The left ventricular internal cavity size was normal in size. There is mild left ventricular hypertrophy. Left ventricular diastolic parameters are indeterminate. Right Ventricle: The right ventricular size is normal. No increase in right ventricular wall thickness. Right ventricular systolic function is normal. Left Atrium: Left atrial size was moderately dilated. Right Atrium: Right atrial size was normal in size. Pericardium: There is no evidence of pericardial effusion. Mitral Valve: The mitral valve is normal in structure. Mild mitral valve regurgitation. Tricuspid Valve: The tricuspid valve is not well visualized. Tricuspid valve regurgitation is not demonstrated. Aortic Valve: Mean aortic valve gradient 591mHg, PG 350mg, Vmax 91m19m DVI=0.29. The aortic valve is calcified. Aortic valve regurgitation is mild. Aortic regurgitation PHT measures 581 msec. Moderate aortic stenosis is present. Aortic valve mean gradient measures 17.7 mmHg. Aortic valve peak gradient measures 31.8 mmHg. Aortic valve area, by VTI measures 1.04 cm. Pulmonic Valve: The pulmonic valve was not well visualized. Pulmonic valve regurgitation is not visualized. Aorta: The aortic root is normal in size and structure. Venous: The inferior vena cava was not well visualized. IAS/Shunts: No atrial level shunt detected by color flow Doppler.  LEFT VENTRICLE PLAX 2D LVIDd:         4.10 cm LVIDs:         2.90 cm LV PW:         1.10 cm LV IVS:        1.50 cm LVOT  diam:     2.00 cm LV SV:         55 LV SV Index:   28 LVOT Area:     3.14 cm  RIGHT VENTRICLE RV Basal diam:  3.10 cm RV Mid diam:    2.10 cm RV S prime:     11.40 cm/s TAPSE (M-mode): 1.8 cm LEFT ATRIUM             Index        RIGHT ATRIUM           Index LA diam:        4.70 cm 2.45 cm/m   RA Area:     16.30 cm LA Vol (A2C):   81.3 ml 42.32 ml/m  RA Volume:   40.30 ml  20.98 ml/m LA Vol (A4C):   88.5 ml 46.06 ml/m LA Biplane Vol: 85.7 ml 44.61 ml/m  AORTIC VALVE AV Area (Vmax):    0.86 cm AV Area (Vmean):   0.88 cm AV Area (VTI):     1.04 cm AV Vmax:           282.00 cm/s AV Vmean:          194.000 cm/s AV VTI:            0.527 m AV Peak Grad:      31.8 mmHg AV Mean Grad:      17.7 mmHg LVOT Vmax:         77.40 cm/s LVOT Vmean:        54.200 cm/s LVOT VTI:          0.174 m LVOT/AV VTI ratio: 0.33 AI PHT:            581 msec  AORTA Ao Root diam: 2.70 cm MITRAL VALVE  TRICUSPID VALVE MV Area (PHT): 5.88 cm    TR Peak grad:   17.6 mmHg MV Decel Time: 129 msec    TR Vmax:        210.00 cm/s MV E velocity: 98.00 cm/s                            SHUNTS                            Systemic VTI:  0.17 m                            Systemic Diam: 2.00 cm Kate Sable MD Electronically signed by Kate Sable MD Signature Date/Time: 09/27/2022/6:52:49 PM    Final    DG Chest Port 1 View  Result Date: 09/27/2022 CLINICAL DATA:  Shortness of breath, history COPD, hypertension, CHF, diabetes mellitus EXAM: PORTABLE CHEST 1 VIEW COMPARISON:  Portable exam 1211 hours compared to 09/26/2022 FINDINGS: Borderline enlargement of cardiac silhouette post CABG. Mediastinal contours and pulmonary vascularity normal. Eventration RIGHT diaphragm unchanged. No definite infiltrate, pleural effusion, or pneumothorax. Osseous structures unremarkable. IMPRESSION: No acute abnormalities. Electronically Signed   By: Lavonia Dana M.D.   On: 09/27/2022 13:39   CT HEAD WO CONTRAST (5MM)  Result Date:  09/26/2022 CLINICAL DATA:  Altered mental status EXAM: CT HEAD WITHOUT CONTRAST TECHNIQUE: Contiguous axial images were obtained from the base of the skull through the vertex without intravenous contrast. RADIATION DOSE REDUCTION: This exam was performed according to the departmental dose-optimization program which includes automated exposure control, adjustment of the mA and/or kV according to patient size and/or use of iterative reconstruction technique. COMPARISON:  None Available. FINDINGS: Brain: There is no mass, hemorrhage or extra-axial collection. The size and configuration of the ventricles and extra-axial CSF spaces are normal. There is hypoattenuation of the white matter, most commonly indicating chronic small vessel disease. Old small vessel infarct of the right thalamus. Vascular: Atherosclerotic calcification of the internal carotid arteries at the skull base. No abnormal hyperdensity of the major intracranial arteries or dural venous sinuses. Skull: The visualized skull base, calvarium and extracranial soft tissues are normal. Sinuses/Orbits: No fluid levels or advanced mucosal thickening of the visualized paranasal sinuses. No mastoid or middle ear effusion. The orbits are normal. IMPRESSION: 1. No acute intracranial abnormality. 2. Chronic small vessel disease and old small vessel infarct of the right thalamus. Electronically Signed   By: Ulyses Jarred M.D.   On: 09/26/2022 20:54   CT ABDOMEN PELVIS W CONTRAST  Result Date: 09/26/2022 CLINICAL DATA:  Dizziness, weakness, diarrhea, found down EXAM: CT ABDOMEN AND PELVIS WITH CONTRAST TECHNIQUE: Multidetector CT imaging of the abdomen and pelvis was performed using the standard protocol following bolus administration of intravenous contrast. RADIATION DOSE REDUCTION: This exam was performed according to the departmental dose-optimization program which includes automated exposure control, adjustment of the mA and/or kV according to patient size  and/or use of iterative reconstruction technique. CONTRAST:  172m OMNIPAQUE IOHEXOL 300 MG/ML  SOLN COMPARISON:  08/31/2021 FINDINGS: Lower chest: No acute pleural or parenchymal lung disease. Hepatobiliary: Hepatic steatosis. No focal liver abnormality. Cholelithiasis without cholecystitis. Pancreas: Unremarkable. No pancreatic ductal dilatation or surrounding inflammatory changes. Spleen: Normal in size without focal abnormality. Adrenals/Urinary Tract: Stable appearance of the bilateral kidneys. No urinary tract calculi or obstructive uropathy. Bladder is moderately distended  without filling defect or focal abnormality. The adrenals are stable. Stomach/Bowel: No bowel obstruction or ileus. No bowel wall thickening or inflammatory change. Vascular/Lymphatic: Aortic atherosclerosis. No enlarged abdominal or pelvic lymph nodes. Reproductive: Prostate is unremarkable. Other: No free fluid or free intraperitoneal gas. Fat containing umbilical hernia unchanged. No bowel herniation. Musculoskeletal: No acute or destructive bony lesions. Reconstructed images demonstrate no additional findings. IMPRESSION: 1. Cholelithiasis without evidence of acute cholecystitis. 2. Fat containing umbilical hernia. 3. Otherwise stable appearance of the abdomen and pelvis without acute finding. 4.  Aortic Atherosclerosis (ICD10-I70.0). Electronically Signed   By: Randa Ngo M.D.   On: 09/26/2022 15:05   DG Chest Portable 1 View  Result Date: 09/26/2022 CLINICAL DATA:  Weakness. EXAM: PORTABLE CHEST 1 VIEW COMPARISON:  Chest x-ray February 5, 24. FINDINGS: Chronically elevated right hemidiaphragm. No definite consolidation. No visible pleural effusions or pneumothorax. No evidence of acute osseous abnormality. Similar cardiomediastinal silhouette. CABG and median sternotomy. IMPRESSION: No active disease. Electronically Signed   By: Margaretha Sheffield M.D.   On: 09/26/2022 12:44             LOS: 2 days    Time spent:  50 mins    Emeterio Reeve, DO [facility] 09/28/2022, 2:40 PM    Dictation software may have been used to generate the above note. Typos may occur and escape review in typed/dictated notes. Please contact Dr Sheppard Coil directly for clarity if needed.  Staff may message me via secure chat in [place]  but this may not receive an immediate response,  please page me for urgent matters!  If 7PM-7AM, please contact night coverage [email or web address]

## 2022-09-28 NOTE — Evaluation (Signed)
Occupational Therapy Evaluation Patient Details Name: Evan [name] MRN: [id number] DOB: 27-Jan-1956 Today's Date: 09/28/2022   History of Present Illness Pt is a 67 year old male admitted with SIRS (systemic inflammatory response syndrome) ([place]) / Severe sepsis unknown source, AMS; PMH significant for hyperlipidemia, paranoid schizophrenia, CAD, non-insulin-dependent diabetes mellitus, hypertension, obesity, diabetic retinopathy   Clinical Impression   Chart reviewed, RN cleared pt for participation in OT evaluation. Pt is oriented to self and place, poor insight into deficits and current level of functioning. Per chart, pt is from ALF, pt reports he is typically able to perform ADL with varying amounts of help "depending on how [he] is doing that day". Assist for IADL from ALF. Pt reports he walked with RW 2 weeks ago however pt is a questionable historian. Pt presents with deficits in strength, endurance, activity tolerance, BUE function, cognition all affecting safe and optimal ADL completion. MAX A required for all bed mobility with pt requiring at least MOD A for static sitting balance. Impaired BUE AROM and strength noted throughout. Pt also presents with R cervical lateral flexion. MIN A required for self feeding with pt with frequent spillage due to FMC/dexterity defects throughout RUE. At this time recommend discharge to STR to address functional deficits. OT will continue to follow acutely.      Recommendations for follow up therapy are one component of a multi-disciplinary discharge planning process, led by the attending physician.  Recommendations may be updated based on patient status, additional functional criteria and insurance authorization.   Follow Up Recommendations  Skilled nursing-short term rehab (<3 hours/day)     Assistance Recommended at Discharge Frequent or constant Supervision/Assistance  Patient can return home with the following Two people to help with walking  and/or transfers;A lot of help with walking and/or transfers    Functional Status Assessment  Patient has had a recent decline in their functional status and demonstrates the ability to make significant improvements in function in a reasonable and predictable amount of time.  Equipment Recommendations  Other (comment) (per next venue of care)    Recommendations for Other Services       Precautions / Restrictions Precautions Precautions: Fall Restrictions Weight Bearing Restrictions: No      Mobility Bed Mobility Overal bed mobility: Needs Assistance Bed Mobility: Sit to Supine, Supine to Sit     Supine to sit: Max assist, HOB elevated Sit to supine: Max assist, HOB elevated   General bed mobility comments: multi modal cueing for pt to assist with bed mobility    Transfers   Equipment used: None               General transfer comment: unsafe to attempt on this date      Balance Overall balance assessment: Needs assistance Sitting-balance support: Single extremity supported Sitting balance-Leahy Scale: Zero Sitting balance - Comments: unable to maintain static upright sitting without at least MOD A Postural control: Right lateral lean                                 ADL either performed or assessed with clinical judgement   ADL Overall ADL's : Needs assistance/impaired Eating/Feeding: Minimal assistance;Sitting Eating/Feeding Details (indicate cue type and reason): all foods/items prepped, pt with food spillage due to poor FMC/dexterity Grooming: Moderate assistance;Wash/dry face;Sitting           Upper Body Dressing : Maximal assistance   Lower Body  Dressing: Maximal assistance       Toileting- Clothing Manipulation and Hygiene: Maximal assistance;Total assistance               Vision Patient Visual Report: No change from baseline       Perception     Praxis      Pertinent Vitals/Pain Pain Assessment Pain Assessment:  CPOT Facial Expression: Tense Body Movements: Absence of movements Muscle Tension: Tense, rigid Compliance with ventilator (intubated pts.): N/A Vocalization (extubated pts.): Talking in normal tone or no sound CPOT Total: 2     Hand Dominance Right   Extremity/Trunk Assessment Upper Extremity Assessment Upper Extremity Assessment: Generalized weakness;RUE deficits/detail;LUE deficits/detail RUE Deficits / Details: RUE approx 1/2 full AROM, elbow 3/4 full AROM, wrist 3/4 full AROM; generalized weakness throughout RUE Sensation: WNL RUE Coordination: decreased fine motor LUE Deficits / Details: LUE approx 1/2 full AROM, elbow 3/4 full AROM, wrist 3/4 full AROM; generalized weakness throughout LUE Coordination: decreased fine motor   Lower Extremity Assessment Lower Extremity Assessment: Generalized weakness   Cervical / Trunk Assessment Cervical / Trunk Assessment:  (R laeral cervical neck flexion)   Communication Communication Communication: No difficulties   Cognition Arousal/Alertness: Awake/alert Behavior During Therapy: Flat affect Overall Cognitive Status: No family/caregiver present to determine baseline cognitive functioning Area of Impairment: Orientation, Attention, Following commands, Safety/judgement, Awareness                 Orientation Level: Disoriented to, Time, Situation Current Attention Level: Focused   Following Commands: Follows one step commands with increased time Safety/Judgement: Decreased awareness of safety, Decreased awareness of deficits Awareness: Intellectual         General Comments  vitals montiored, appear stable throughout    Exercises Other Exercises Other Exercises: edu re: role of OT, role of rehab, discharge recommendations   Shoulder Instructions      Home Living Family/patient expects to be discharged to:: Assisted living (Per chart pt is a resident at [facility])                              Home Equipment: Conservation officer, nature (2 wheels);Wheelchair - manual   Additional Comments: pt reports he lives in assisted living, was walking with RW prior to admission but also requiring use of mwc; poor historian      Prior Functioning/Environment Prior Level of Function : Independent/Modified Independent             Mobility Comments: Pt reports amb with RW approx 2 weeks ago however ?historian ADLs Comments: assist for ADLs- dressing, bathing, grooming depending on day per pt report. Pt reports he was able to feed himself. Assist for all IADLs        OT Problem List: Decreased strength;Decreased activity tolerance;Decreased range of motion;Impaired balance (sitting and/or standing);Decreased cognition;Decreased knowledge of use of DME or AE;Decreased safety awareness;Impaired UE functional use      OT Treatment/Interventions: Self-care/ADL training;DME and/or AE instruction;Balance training;Therapeutic exercise;Energy conservation;Patient/family education    OT Goals(Current goals can be found in the care plan section) Acute Rehab OT Goals Patient Stated Goal: get stronger OT Goal Formulation: With patient Time For Goal Achievement: 10/12/22 Potential to Achieve Goals: Good ADL Goals Pt Will Perform Grooming: with min assist;sitting Pt Will Perform Lower Body Dressing: with mod assist;sitting/lateral leans Pt Will Transfer to Toilet: with min assist Pt Will Perform Toileting - Clothing Manipulation and hygiene: with mod assist Pt/caregiver will Perform  Home Exercise Program: Increased strength;Right Upper extremity;Left upper extremity  OT Frequency: Min 2X/week    Co-evaluation              AM-PAC OT "6 Clicks" Daily Activity     Outcome Measure Help from another person eating meals?: A Little Help from another person taking care of personal grooming?: A Little Help from another person toileting, which includes using toliet, bedpan, or urinal?: Total Help from  another person bathing (including washing, rinsing, drying)?: Total Help from another person to put on and taking off regular upper body clothing?: A Little Help from another person to put on and taking off regular lower body clothing?: A Lot 6 Click Score: 13   End of Session Nurse Communication: Mobility status  Activity Tolerance: Patient tolerated treatment well Patient left: in bed;with call bell/phone within reach;with bed alarm set  OT Visit Diagnosis: Other abnormalities of gait and mobility (R26.89);Unsteadiness on feet (R26.81);Muscle weakness (generalized) (M62.81)                Time: 1337-1400 OT Time Calculation (min): 23 min Charges:  OT General Charges $OT Visit: 1 Visit OT Evaluation $OT Eval Moderate Complexity: 1 Mod  Shanon Payor, OTD OTR/L  09/28/22, 3:30 PM

## 2022-09-29 DIAGNOSIS — R6510 Systemic inflammatory response syndrome (SIRS) of non-infectious origin without acute organ dysfunction: Secondary | ICD-10-CM | POA: Diagnosis not present

## 2022-09-29 DIAGNOSIS — G259 Extrapyramidal and movement disorder, unspecified: Secondary | ICD-10-CM | POA: Diagnosis not present

## 2022-09-29 DIAGNOSIS — R4182 Altered mental status, unspecified: Secondary | ICD-10-CM

## 2022-09-29 LAB — MAGNESIUM: Magnesium: 2.2 mg/dL (ref 1.7–2.4)

## 2022-09-29 LAB — BASIC METABOLIC PANEL
Anion gap: 7 (ref 5–15)
BUN: 21 mg/dL (ref 8–23)
CO2: 22 mmol/L (ref 22–32)
Calcium: 8.3 mg/dL — ABNORMAL LOW (ref 8.9–10.3)
Chloride: 106 mmol/L (ref 98–111)
Creatinine, Ser: 0.72 mg/dL (ref 0.61–1.24)
GFR, Estimated: >60 mL/min (ref >60)
Glucose, Bld: 159 mg/dL — ABNORMAL HIGH (ref 70–99)
Potassium: 3.6 mmol/L (ref 3.5–5.1)
Sodium: 135 mmol/L (ref 135–145)

## 2022-09-29 LAB — GLUCOSE, CAPILLARY
Glucose-Capillary: 152 mg/dL — ABNORMAL HIGH (ref 70–99)
Glucose-Capillary: 182 mg/dL — ABNORMAL HIGH (ref 70–99)
Glucose-Capillary: 172 mg/dL — ABNORMAL HIGH (ref 70–99)
Glucose-Capillary: 146 mg/dL — ABNORMAL HIGH (ref 70–99)

## 2022-09-29 LAB — CBC WITH DIFFERENTIAL/PLATELET
Abs Immature Granulocytes: 0.09 10*3/uL — ABNORMAL HIGH (ref 0.00–0.07)
Basophils Absolute: 0 10*3/uL (ref 0.0–0.1)
Basophils Relative: 0 %
Eosinophils Absolute: 0 10*3/uL (ref 0.0–0.5)
Eosinophils Relative: 0 %
HCT: 33.7 % — ABNORMAL LOW (ref 39.0–52.0)
Hemoglobin: 10.8 g/dL — ABNORMAL LOW (ref 13.0–17.0)
Immature Granulocytes: 1 %
Lymphocytes Relative: 6 %
Lymphs Abs: 0.8 10*3/uL (ref 0.7–4.0)
MCH: 28.3 pg (ref 26.0–34.0)
MCHC: 32 g/dL (ref 30.0–36.0)
MCV: 88.2 fL (ref 80.0–100.0)
Monocytes Absolute: 0.4 10*3/uL (ref 0.1–1.0)
Monocytes Relative: 3 %
Neutro Abs: 11.8 10*3/uL — ABNORMAL HIGH (ref 1.7–7.7)
Neutrophils Relative %: 90 %
Platelets: 378 10*3/uL (ref 150–400)
RBC: 3.82 MIL/uL — ABNORMAL LOW (ref 4.22–5.81)
RDW: 14.7 % (ref 11.5–15.5)
WBC: 13.2 10*3/uL — ABNORMAL HIGH (ref 4.0–10.5)
nRBC: 0 % (ref 0.0–0.2)

## 2022-09-29 LAB — THYROID PANEL WITH TSH
Free Thyroxine Index: 2.1 (ref 1.2–4.9)
T3 Uptake Ratio: 30 % (ref 24–39)
T4, Total: 6.9 ug/dL (ref 4.5–12.0)
TSH: 1.82 u[IU]/mL (ref 0.450–4.500)

## 2022-09-29 LAB — PHOSPHORUS: Phosphorus: 2.6 mg/dL (ref 2.5–4.6)

## 2022-09-29 MED ORDER — RISPERIDONE 0.5 MG PO TABS
1.0000 mg | ORAL_TABLET | Freq: Two times a day (BID) | ORAL | Status: DC
  Administered 2022-09-29 – 2022-10-06 (×14): 1 mg via ORAL
  Filled 2022-09-29 (×13): qty 1
  Filled 2022-09-29: qty 2
  Filled 2022-09-29: qty 1
  Filled 2022-09-29: qty 2

## 2022-09-29 NOTE — Procedures (Signed)
Patient Name: Evan [name]  MRN: [id number]  Epilepsy Attending: Lora Havens  Referring Physician/Provider: Amie Portland, MD  Date: 09/29/2022 Duration: 24.48 mins  Patient history: 67yo M with ams. EEG to evaluate for seizure  Level of alertness: Awake  AEDs during EEG study: None  Technical aspects: This EEG study was done with scalp electrodes positioned according to the 10-20 International system of electrode placement. Electrical activity was reviewed with band pass filter of 1-'70Hz'$ , sensitivity of 7 uV/mm, display speed of 31m/sec with a '60Hz'$  notched filter applied as appropriate. EEG data were recorded continuously and digitally stored.  Video monitoring was available and reviewed as appropriate.  Description: No clear posterior dominant rhythm was seen. EEG showed continuous generalized predominantly 5-'7Hz'$  Hz theta slowing admixed with intermittent generalized rhythmic 2-'3Hz'$  delta slowing. Hyperventilation and photic stimulation were not performed.     ABNORMALITY - Continuous slow, generalized  IMPRESSION: This study is suggestive of moderate diffuse encephalopathy, nonspecific etiology. No seizures or epileptiform discharges were seen throughout the recording.  Evan [name]

## 2022-09-29 NOTE — Plan of Care (Signed)
Problem: Fluid Volume: Goal: Hemodynamic stability will improve 09/29/2022 1122 by Brooke Bonito, RN Outcome: Progressing 09/29/2022 1055 by Brooke Bonito, RN Outcome: Progressing   Problem: Clinical Measurements: Goal: Diagnostic test results will improve 09/29/2022 1122 by Brooke Bonito, RN Outcome: Progressing 09/29/2022 1055 by Brooke Bonito, RN Outcome: Progressing Goal: Signs and symptoms of infection will decrease 09/29/2022 1122 by Brooke Bonito, RN Outcome: Progressing 09/29/2022 1055 by Brooke Bonito, RN Outcome: Progressing   Problem: Respiratory: Goal: Ability to maintain adequate ventilation will improve 09/29/2022 1122 by Brooke Bonito, RN Outcome: Progressing 09/29/2022 1055 by Brooke Bonito, RN Outcome: Progressing   Problem: Education: Goal: Knowledge of General Education information will improve Description: Including pain rating scale, medication(s)/side effects and non-pharmacologic comfort measures 09/29/2022 1122 by Brooke Bonito, RN Outcome: Progressing 09/29/2022 1055 by Brooke Bonito, RN Outcome: Progressing   Problem: Respiratory: Goal: Ability to maintain adequate ventilation will improve 09/29/2022 1122 by Brooke Bonito, RN Outcome: Progressing 09/29/2022 1055 by Brooke Bonito, RN Outcome: Progressing   Problem: Education: Goal: Knowledge of General Education information will improve Description: Including pain rating scale, medication(s)/side effects and non-pharmacologic comfort measures 09/29/2022 1122 by Brooke Bonito, RN Outcome: Progressing 09/29/2022 1055 by Brooke Bonito, RN Outcome: Progressing   Problem: Health Behavior/Discharge Planning: Goal: Ability to manage health-related needs will improve 09/29/2022 1122 by Brooke Bonito, RN Outcome: Progressing 09/29/2022 1055 by Brooke Bonito, RN Outcome: Progressing   Problem: Clinical Measurements: Goal: Ability to maintain clinical measurements within normal limits will improve 09/29/2022  1122 by Brooke Bonito, RN Outcome: Progressing 09/29/2022 1055 by Brooke Bonito, RN Outcome: Progressing Goal: Will remain free from infection 09/29/2022 1122 by Brooke Bonito, RN Outcome: Progressing 09/29/2022 1055 by Brooke Bonito, RN Outcome: Progressing Goal: Diagnostic test results will improve 09/29/2022 1122 by Brooke Bonito, RN Outcome: Progressing 09/29/2022 1055 by Brooke Bonito, RN Outcome: Progressing Goal: Respiratory complications will improve 09/29/2022 1122 by Brooke Bonito, RN Outcome: Progressing 09/29/2022 1055 by Brooke Bonito, RN Outcome: Progressing Goal: Cardiovascular complication will be avoided 09/29/2022 1122 by Brooke Bonito, RN Outcome: Progressing 09/29/2022 1055 by Brooke Bonito, RN Outcome: Progressing   Problem: Activity: Goal: Risk for activity intolerance will decrease 09/29/2022 1122 by Brooke Bonito, RN Outcome: Progressing 09/29/2022 1055 by Brooke Bonito, RN Outcome: Progressing   Problem: Nutrition: Goal: Adequate nutrition will be maintained 09/29/2022 1122 by Brooke Bonito, RN Outcome: Progressing 09/29/2022 1055 by Brooke Bonito, RN Outcome: Progressing   Problem: Coping: Goal: Level of anxiety will decrease 09/29/2022 1122 by Brooke Bonito, RN Outcome: Progressing 09/29/2022 1055 by Brooke Bonito, RN Outcome: Progressing   Problem: Elimination: Goal: Will not experience complications related to bowel motility 09/29/2022 1122 by Brooke Bonito, RN Outcome: Progressing 09/29/2022 1055 by Brooke Bonito, RN Outcome: Progressing Goal: Will not experience complications related to urinary retention 09/29/2022 1122 by Brooke Bonito, RN Outcome: Progressing 09/29/2022 1055 by Brooke Bonito, RN Outcome: Progressing   Problem: Pain Managment: Goal: General experience of comfort will improve 09/29/2022 1122 by Brooke Bonito, RN Outcome: Progressing 09/29/2022 1055 by Brooke Bonito, RN Outcome: Progressing   Problem: Safety: Goal: Ability to  remain free from injury will improve 09/29/2022 1122 by Brooke Bonito, RN Outcome: Progressing 09/29/2022 1055 by Brooke Bonito, RN Outcome: Progressing   Problem: Skin Integrity: Goal: Risk for impaired skin integrity will decrease 09/29/2022 1122 by Brooke Bonito, RN Outcome: Progressing 09/29/2022 1055 by Brooke Bonito, RN Outcome: Progressing   Problem: Education: Goal: Ability to describe self-care measures that may prevent or decrease complications (Diabetes Survival Skills Education) will  improve 09/29/2022 1122 by Brooke Bonito, RN Outcome: Progressing 09/29/2022 1055 by Brooke Bonito, RN Outcome: Progressing Goal: Individualized Educational Video(s) 09/29/2022 1122 by Brooke Bonito, RN Outcome: Progressing 09/29/2022 1055 by Brooke Bonito, RN Outcome: Progressing   Problem: Coping: Goal: Ability to adjust to condition or change in health will improve 09/29/2022 1122 by Brooke Bonito, RN Outcome: Progressing 09/29/2022 1055 by Brooke Bonito, RN Outcome: Progressing   Problem: Fluid Volume: Goal: Ability to maintain a balanced intake and output will improve 09/29/2022 1122 by Brooke Bonito, RN Outcome: Progressing 09/29/2022 1055 by Brooke Bonito, RN Outcome: Progressing   Problem: Health Behavior/Discharge Planning: Goal: Ability to identify and utilize available resources and services will improve 09/29/2022 1122 by Brooke Bonito, RN Outcome: Progressing 09/29/2022 1055 by Brooke Bonito, RN Outcome: Progressing Goal: Ability to manage health-related needs will improve 09/29/2022 1122 by Brooke Bonito, RN Outcome: Progressing 09/29/2022 1055 by Brooke Bonito, RN Outcome: Progressing   Problem: Metabolic: Goal: Ability to maintain appropriate glucose levels will improve 09/29/2022 1122 by Brooke Bonito, RN Outcome: Progressing 09/29/2022 1055 by Brooke Bonito, RN Outcome: Progressing   Problem: Nutritional: Goal: Maintenance of adequate nutrition will  improve 09/29/2022 1122 by Brooke Bonito, RN Outcome: Progressing 09/29/2022 1055 by Brooke Bonito, RN Outcome: Progressing Goal: Progress toward achieving an optimal weight will improve 09/29/2022 1122 by Brooke Bonito, RN Outcome: Progressing 09/29/2022 1055 by Brooke Bonito, RN Outcome: Progressing   Problem: Skin Integrity: Goal: Risk for impaired skin integrity will decrease 09/29/2022 1122 by Brooke Bonito, RN Outcome: Progressing 09/29/2022 1055 by Brooke Bonito, RN Outcome: Progressing   Problem: Tissue Perfusion: Goal: Adequacy of tissue perfusion will improve 09/29/2022 1122 by Brooke Bonito, RN Outcome: Progressing 09/29/2022 1055 by Brooke Bonito, RN Outcome: Progressing

## 2022-09-29 NOTE — Progress Notes (Signed)
Eeg done 

## 2022-09-29 NOTE — Progress Notes (Signed)
PROGRESS NOTE    [name]   [name] DOB: [birth year]  DOA: 09/26/2022 Date of Service: 09/29/22 PCP: [name], FNP     Brief Narrative / Hospital Course:  [name] is a 67 year old male with history of hyperlipidemia, paranoid schizophrenia, CAD, non-insulin-dependent diabetes mellitus, hypertension, obesity, diabetic retinopathy, who presents emergency department for chief concerns of dizziness from [facility] via EMS. 02/20: afebrile, RR 21, tachycardic at HR 120, blood pressure 101/72, SpO2 94% on room air. Serum creatinine 1.55, EGFR 48, WBC 18.5, hemoglobin 14.3, platelets 512, Lactic acid was 3.3 and on repeat was 3.0. COVID/influenza A/influenza B/RSV PCR were negative. UA was negative for leukocytes and nitrates. Blood cultures x 2 have been ordered and are in process.CT abdomen pelvis with contrast: Was read as cholelithiasis without evidence of acute cholecystitis.  Fat-containing umbilical hernia.  Otherwise stable appearance of the abdomen pelvis without acute finding.  Aortic atherosclerosis. ED treatment: Cefepime and vancomycin per pharmacy.  LR 1 L bolus. Admitted to hospitalist service for sepsis unknown source. 02/21: hypotensive overnight, responsive to fluid bolus, BP still low in AM, started midodrine and gave another 1L bolus, BP held briefly --> pt transferred to stepdown for norepi gtt. C/o chest pressure, (+)murmur on cardiac exam, ?rales/atelectasis --> Echo, BNP elevated at 428, CXR, Troponin, EKG. Eval acidosis --> VBG was ok 02/22: BP improving, off norepi approx 10:00 am, scheduled midodrine and stress dose steroids (Cortisol level 20).  BCx NGx2d, GI PCR and CDiff pending, repeat BNP higher at 988, lactic and WBC coming down. Echo LVEF 50-55% w/ low normal fxn, mild LVH, indeterminate diastolic, moderate hypokinesis inferolateral LV, moderate aortic valve stenosis. Net IO Since Admission: 3,207.89 mL [09/28/22 1440] 02/23: neurology  eval - Possible extrapyramidal symptoms from antipsychotics --> request psych eval for rx mgt, MRI brain ordered and EEG though CVA/seizures unlikely.     Consultants:  none  Procedures: none      ASSESSMENT & PLAN:   Principal Problem:   SIRS (systemic inflammatory response syndrome) (HCC) Active Problems:   Hypertension associated with diabetes (HCC)   Esophageal dysmotility   Chronic diastolic CHF (congestive heart failure) (HCC)   Paranoid schizophrenia (HCC)   Type 2 diabetes mellitus with morbid obesity (HCC)   Obstructive sleep apnea on CPAP   Hyperlipidemia associated with type 2 diabetes mellitus (HCC)   Persistent atrial fibrillation (HCC)   CAD (coronary artery disease)   Hoarding disorder   COPD, mild (HCC)   Atherosclerosis of aorta (HCC)   History of CVA (cerebrovascular accident)   Leukocytosis   AKI (acute kidney injury) ([place])   Shock circulatory (HCC)   Elevated lactic acid level   Left ventricular hypokinesis inferolateral wall   Moderate aortic stenosis   Hypotension - improved  SIRS (systemic inflammatory response syndrome) (HCC) / Severe sepsis unknown source Altered mental status - improved  Etiology workup in progress status post cefepime, vancomycin per pharmacy and LR total 3 L boluses Eval acidosis --> VBG no concerns  Continue with cefepime and vancomycin per pharmacy today 09/28/22 is Day 3 abx Follow BCx Levophed gtt in SDU - improved on this, off as of 10:00 this morning  ICU team following   Cardiogenic shock less likely  EKG appears c/w yesterday, P waves difficult to appreciate but rhythm appears regular Tropes very slight elevation, trend down, likely demand ischemia / hypovolemia  CXR WNL BNP slightly elevated 428, hold further fluids for now --> recheck today 988 and (+)aortic stenosis -->  diurese if BP tolerates TSH pending  Continue scheduled midodrine and stress dose steroids initiated by ICU team, appreciate help  Procal  0.21  Left ventricular hypokinesis inferolateral wall Moderate aortic stenosis Consider cardiology consult if worse, otherwise should be able to follow outpatient  Consider Lasix if BP tolerates  Strict I&O  Tremor, question parkinsonism (primary vs med effect) Pt reprots numbness lower extremities and "can't move" but then able to move somewhat, question neuro sensory/motor deficit Possible extrapyramidal symptoms from antipsychotics  request psych eval for rx mgt, should d/c haldol MRI brain ordered and EEG though CVA/seizures unlikely.  B12, ammonia, TSH Neurology to follow   AKI (acute kidney injury) ([place]) - resolved Monitor BMP   Leukocytosis Etiology workup in progress, treat per above Monitor CBC   History of CVA (cerebrovascular accident) Resumed home aspirin 81 mg daily, simvastatin 20 mg nightly  Essential Hypertension Holding home metoprolol succinate 50 mg daily at this time due to possible sepsis/severe sepsis presentation Hydralazine 5 mg IV every 8 hours as needed for SBP > 180, 4 days ordered   COPD, mild (HCC) Not in acute exacerbation at this time Albuterol nebulizer, every 6 hours as needed for wheezing and shortness of breath, 5 days ordered   Hyperlipidemia associated with type 2 diabetes mellitus (HCC) Simvastatin 20 mg nightly resumed   Paranoid schizophrenia ([place]) Resumed home risperidone 0.5 mg p.o. twice daily, Haldol 1 mg nightly Concern for haldol causing extrapyramidal symptoms --> psychiatry consult for rx mgmt    History Atrial Fibrillation Continue Xarelto  Beta blocker on hold d/t hypotension    DVT prophylaxis: Xarelto Pertinent IV fluids/nutrition: LR bolus as above, continuous IV fluids following resuscitation Central lines / invasive devices: none  Code Status: FULL CODE   Current Admission Status: inpatient  TOC needs / Dispo plan: SNF placement Barriers to discharge / significant pending items: await placement, MRI, EEG,  psychiatry eval - likely will be here thru the weekend (today is Friday)              Subjective / Brief ROS:  Patient has no concerns today    Family Communication: none at this time, will reach out to his guardian pending results     Objective Findings:  Vitals:   09/29/22 0500 09/29/22 0800 09/29/22 0953 09/29/22 1528  BP:  99/72 109/72 119/72  Pulse:   (!) 102 95  Resp:  '12 18 16  '$ Temp:  98.4 F (36.9 C) 98.2 F (36.8 C) 98 F (36.7 C)  TempSrc:  Oral    SpO2:  100% 99% 100%  Weight: 88.4 kg     Height:        Intake/Output Summary (Last 24 hours) at 09/29/2022 1538 Last data filed at 09/29/2022 1528 Gross per 24 hour  Intake 1879.77 ml  Output 2100 ml  Net -220.23 ml   Filed Weights   09/26/22 0945 09/27/22 1206 09/29/22 0500  Weight: 89 kg 84.7 kg 88.4 kg    Examination:  Physical Exam Constitutional:      General: He is not in acute distress.    Appearance: Normal appearance. He is not toxic-appearing.  HENT:     Left Ear: External ear normal.     Nose:     Comments: Reports some epistaxis, has dried blood scabs in nares no active bleeding Eyes:     Extraocular Movements: Extraocular movements intact.  Cardiovascular:     Rate and Rhythm: Normal rate and regular rhythm.  Heart sounds: Murmur heard.  Pulmonary:     Effort: No respiratory distress.     Breath sounds: No rales.  Abdominal:     General: Abdomen is flat.     Palpations: Abdomen is soft.     Tenderness: There is no abdominal tenderness.  Musculoskeletal:     Right lower leg: No edema.     Left lower leg: No edema.  Skin:    General: Skin is dry.     Coloration: Skin is pale.  Neurological:     General: No focal deficit present.     Mental Status: He is alert. He is disoriented.     Cranial Nerves: No cranial nerve deficit.     Sensory: No sensory deficit.     Motor: Weakness present.  Psychiatric:        Mood and Affect: Mood normal.        Behavior: Behavior  normal.          Scheduled Medications:   aspirin EC  81 mg Oral Daily   Chlorhexidine Gluconate Cloth  6 each Topical Daily   feeding supplement  237 mL Oral BID BM   haloperidol  1 mg Oral QHS   insulin aspart  0-5 Units Subcutaneous QHS   insulin aspart  0-9 Units Subcutaneous TID WC   midodrine  10 mg Oral TID WC   mometasone-formoterol  2 puff Inhalation BID   multivitamin with minerals  1 tablet Oral Daily   pantoprazole  40 mg Oral QAC breakfast   risperiDONE  0.5 mg Oral BID   rivaroxaban  20 mg Oral Q supper   senna  2 tablet Oral QHS   simvastatin  20 mg Oral QHS    Continuous Infusions:  sodium chloride Stopped (09/27/22 1211)   cefTRIAXone (ROCEPHIN)  IV 2 g (09/29/22 1036)    PRN Medications:  acetaminophen **OR** acetaminophen, albuterol, melatonin, menthol-cetylpyridinium, nitroGLYCERIN, nystatin cream, ondansetron **OR** ondansetron (ZOFRAN) IV, senna-docusate  Antimicrobials from admission:  Anti-infectives (From admission, onward)    Start     Dose/Rate Route Frequency Ordered Stop   09/29/22 1000  cefTRIAXone (ROCEPHIN) 2 g in sodium chloride 0.9 % 100 mL IVPB        2 g 200 mL/hr over 30 Minutes Intravenous Every 24 hours 09/28/22 1110 10/01/22 0959   09/27/22 1100  vancomycin (VANCOCIN) IVPB 1000 mg/200 mL premix  Status:  Discontinued        1,000 mg 200 mL/hr over 60 Minutes Intravenous Every 12 hours 09/27/22 1025 09/28/22 1108   09/27/22 1015  ceFEPIme (MAXIPIME) 2 g in sodium chloride 0.9 % 100 mL IVPB        2 g 200 mL/hr over 30 Minutes Intravenous Every 8 hours 09/27/22 1012 09/28/22 2230   09/27/22 0030  ceFEPIme (MAXIPIME) 2 g in sodium chloride 0.9 % 100 mL IVPB  Status:  Discontinued        2 g 200 mL/hr over 30 Minutes Intravenous Every 12 hours 09/26/22 1647 09/27/22 1012   09/26/22 1630  vancomycin (VANCOREADY) IVPB 750 mg/150 mL        750 mg 150 mL/hr over 60 Minutes Intravenous  Once 09/26/22 1546 09/26/22 1931   09/26/22  1230  ceFEPIme (MAXIPIME) 2 g in sodium chloride 0.9 % 100 mL IVPB        2 g 200 mL/hr over 30 Minutes Intravenous  Once 09/26/22 1220 09/26/22 1302   09/26/22 1230  vancomycin (VANCOCIN) IVPB 1000 mg/200  mL premix        1,000 mg 200 mL/hr over 60 Minutes Intravenous  Once 09/26/22 1220 09/26/22 1410           Data Reviewed:  I have personally reviewed the following...  CBC: Recent Labs  Lab 09/26/22 1010 09/27/22 0529 09/28/22 0620 09/29/22 0546  WBC 18.5* 13.2* 13.0* 13.2*  NEUTROABS  --   --   --  11.8*  HGB 14.3 11.7* 11.7* 10.8*  HCT 44.7 36.8* 37.1* 33.7*  MCV 88.7 90.4 90.7 88.2  PLT 512* 393 396 [id number]   Basic Metabolic Panel: Recent Labs  Lab 09/26/22 1010 09/27/22 0529 09/27/22 1937 09/28/22 0620 09/29/22 0546  NA 137 136  --  137 135  K 3.7 3.4* 3.3* 4.0 3.6  CL 104 106  --  108 106  CO2 18* 20*  --  18* 22  GLUCOSE 151* 120*  --  124* 159*  BUN 34* 24*  --  15 21  CREATININE 1.55* 0.86  --  0.77 0.72  CALCIUM 9.2 8.3*  --  8.4* 8.3*  MG  --   --  2.1 2.1 2.2  PHOS  --   --  2.9 3.0 2.6   GFR: Estimated Creatinine Clearance: 90.4 mL/min (by C-G formula based on SCr of 0.72 mg/dL). Liver Function Tests: Recent Labs  Lab 09/27/22 1449 09/28/22 0620  AST 78* 59*  ALT 31 30  ALKPHOS 72 73  BILITOT 0.9 1.2  PROT 5.2* 5.4*  ALBUMIN 2.3* 2.3*   No results for input(s): "LIPASE", "AMYLASE" in the last 168 hours. No results for input(s): "AMMONIA" in the last 168 hours. Coagulation Profile: No results for input(s): "INR", "PROTIME" in the last 168 hours. Cardiac Enzymes: No results for input(s): "CKTOTAL", "CKMB", "CKMBINDEX", "TROPONINI" in the last 168 hours. BNP (last 3 results) No results for input(s): "PROBNP" in the last 8760 hours. HbA1C: Recent Labs    09/27/22 0529  HGBA1C 6.0*   CBG: Recent Labs  Lab 09/28/22 1201 09/28/22 1632 09/28/22 2104 09/29/22 0743 09/29/22 1126  GLUCAP 165* 184* 137* 152* 182*   Lipid  Profile: No results for input(s): "CHOL", "HDL", "LDLCALC", "TRIG", "CHOLHDL", "LDLDIRECT" in the last 72 hours. Thyroid Function Tests: Recent Labs    09/27/22 1306  TSH 1.820  T4TOTAL 6.9   Anemia Panel: No results for input(s): "VITAMINB12", "FOLATE", "FERRITIN", "TIBC", "IRON", "RETICCTPCT" in the last 72 hours. Most Recent Urinalysis On File:     Component Value Date/Time   COLORURINE YELLOW (A) 09/26/2022 1117   APPEARANCEUR HAZY (A) 09/26/2022 1117   APPEARANCEUR Clear 09/20/2021 0856   LABSPEC 1.013 09/26/2022 1117   LABSPEC 1.013 11/29/2014 1120   PHURINE 5.0 09/26/2022 1117   GLUCOSEU >=500 (A) 09/26/2022 1117   GLUCOSEU Negative 11/29/2014 1120   HGBUR LARGE (A) 09/26/2022 1117   BILIRUBINUR NEGATIVE 09/26/2022 1117   BILIRUBINUR Negative 09/20/2021 0856   BILIRUBINUR Negative 11/29/2014 1120   KETONESUR NEGATIVE 09/26/2022 1117   PROTEINUR 30 (A) 09/26/2022 1117   NITRITE NEGATIVE 09/26/2022 1117   LEUKOCYTESUR NEGATIVE 09/26/2022 1117   LEUKOCYTESUR Negative 11/29/2014 1120   Sepsis Labs: '@LABRCNTIP'$ (procalcitonin:4,lacticidven:4) Microbiology: Recent Results (from the past 240 hour(s))  Resp panel by RT-PCR (RSV, Flu A&B, Covid) Anterior Nasal Swab     Status: None   Collection Time: 09/26/22 10:03 AM   Specimen: Anterior Nasal Swab  Result Value Ref Range Status   SARS Coronavirus 2 by RT PCR NEGATIVE NEGATIVE Final    Comment: (  NOTE) SARS-CoV-2 target nucleic acids are NOT DETECTED.  The SARS-CoV-2 RNA is generally detectable in upper respiratory specimens during the acute phase of infection. The lowest concentration of SARS-CoV-2 viral copies this assay can detect is 138 copies/mL. A negative result does not preclude SARS-Cov-2 infection and should not be used as the sole basis for treatment or other patient management decisions. A negative result may occur with  improper specimen collection/handling, submission of specimen other than nasopharyngeal  swab, presence of viral mutation(s) within the areas targeted by this assay, and inadequate number of viral copies(<138 copies/mL). A negative result must be combined with clinical observations, patient history, and epidemiological information. The expected result is Negative.  Fact Sheet for Patients:  EntrepreneurPulse.com.au  Fact Sheet for Healthcare Providers:  IncredibleEmployment.be  This test is no t yet approved or cleared by the [place] FDA and  has been authorized for detection and/or diagnosis of SARS-CoV-2 by FDA under an Emergency Use Authorization (EUA). This EUA will remain  in effect (meaning this test can be used) for the duration of the COVID-19 declaration under Section 564(b)(1) of the Act, 21 U.S.C.section 360bbb-3(b)(1), unless the authorization is terminated  or revoked sooner.       Influenza A by PCR NEGATIVE NEGATIVE Final   Influenza B by PCR NEGATIVE NEGATIVE Final    Comment: (NOTE) The Xpert Xpress SARS-CoV-2/FLU/RSV plus assay is intended as an aid in the diagnosis of influenza from Nasopharyngeal swab specimens and should not be used as a sole basis for treatment. Nasal washings and aspirates are unacceptable for Xpert Xpress SARS-CoV-2/FLU/RSV testing.  Fact Sheet for Patients: EntrepreneurPulse.com.au  Fact Sheet for Healthcare Providers: IncredibleEmployment.be  This test is not yet approved or cleared by the [place] FDA and has been authorized for detection and/or diagnosis of SARS-CoV-2 by FDA under an Emergency Use Authorization (EUA). This EUA will remain in effect (meaning this test can be used) for the duration of the COVID-19 declaration under Section 564(b)(1) of the Act, 21 U.S.C. section 360bbb-3(b)(1), unless the authorization is terminated or revoked.     Resp Syncytial Virus by PCR NEGATIVE NEGATIVE Final    Comment: (NOTE) Fact Sheet for  Patients: EntrepreneurPulse.com.au  Fact Sheet for Healthcare Providers: IncredibleEmployment.be  This test is not yet approved or cleared by the [place] FDA and has been authorized for detection and/or diagnosis of SARS-CoV-2 by FDA under an Emergency Use Authorization (EUA). This EUA will remain in effect (meaning this test can be used) for the duration of the COVID-19 declaration under Section 564(b)(1) of the Act, 21 U.S.C. section 360bbb-3(b)(1), unless the authorization is terminated or revoked.  Performed at [facility], [address] [place], [place] [address]   Blood Culture (routine x 2)     Status: None (Preliminary result)   Collection Time: 09/26/22 11:17 AM   Specimen: BLOOD RIGHT ARM  Result Value Ref Range Status   Specimen Description BLOOD RIGHT ARM  Final   Special Requests   Final    BOTTLES DRAWN AEROBIC AND ANAEROBIC Blood Culture results may not be optimal due to an excessive volume of blood received in culture bottles   Culture   Final    NO GROWTH 3 DAYS Performed at [facility], [address] [place], [place] [address]    Report Status PENDING  Incomplete  Blood Culture (routine x 2)     Status: None (Preliminary result)   Collection Time: 09/26/22 11:17 AM   Specimen: BLOOD  LEFT ARM  Result Value Ref Range Status   Specimen Description BLOOD LEFT ARM  Final   Special Requests   Final    BOTTLES DRAWN AEROBIC AND ANAEROBIC Blood Culture results may not be optimal due to an excessive volume of blood received in culture bottles   Culture   Final    NO GROWTH 3 DAYS Performed at [facility], [address] [place], [place] [address]    Report Status PENDING  Incomplete  MRSA Next Gen by PCR, Nasal     Status: None   Collection Time: 09/27/22 12:08 PM   Specimen: Nasal Mucosa; Nasal Swab  Result Value Ref Range Status   MRSA by PCR Next Gen NOT DETECTED NOT DETECTED Final     Comment: (NOTE) The GeneXpert MRSA Assay (FDA approved for NASAL specimens only), is one component of a comprehensive MRSA colonization surveillance program. It is not intended to diagnose MRSA infection nor to guide or monitor treatment for MRSA infections. Test performance is not FDA approved in patients less than 26 years old. Performed at [facility], [address], [place], [place] [address]       Radiology Studies last 3 days: ECHOCARDIOGRAM COMPLETE  Result Date: 09/27/2022    ECHOCARDIOGRAM REPORT   Patient Name:   Evan [name] Date of Exam: 09/27/2022 Medical Rec #:  [id number]        Height:       65.0 in Accession #:    [id number]       Weight:       186.7 lb Date of Birth:  05-05-1956         BSA:          1.921 m Patient Age:    74 years         BP:           82/52 mmHg Patient Gender: M                HR:           67 bpm. Exam Location:  [facility] Procedure: 2D Echo, Color Doppler and Cardiac Doppler Indications:     Murmur R01.1  History:         Patient has prior history of Echocardiogram examinations, most                  recent 11/07/2020. CAD and Previous Myocardial Infarction, COPD;                  Risk Factors:Diabetes.  Sonographer:     Sherrie Sport Referring Phys:  [id number] Emeterio Reeve Diagnosing Phys: Kate Sable MD IMPRESSIONS  1. Left ventricular ejection fraction, by estimation, is 50 to 55%. The left ventricle has low normal function. The left ventricle has no regional wall motion abnormalities. There is mild left ventricular hypertrophy. Left ventricular diastolic parameters are indeterminate. There is moderate hypokinesis of the left ventricular, entire inferolateral wall.  2. Right ventricular systolic function is normal. The right ventricular size is normal.  3. Left atrial size was moderately dilated.  4. The mitral valve is normal in structure. Mild mitral valve regurgitation.  5. Mean aortic valve gradient 46mHg, PG 370mg, Vmax 42m37m DVI=0.29.  The aortic valve is calcified. Aortic valve regurgitation is mild. Moderate aortic valve stenosis. FINDINGS  Left Ventricle: Left ventricular ejection fraction, by estimation, is 50 to 55%. The left ventricle has low normal function. The left ventricle has no regional wall motion  abnormalities. Moderate hypokinesis of the left ventricular, entire inferolateral  wall. The left ventricular internal cavity size was normal in size. There is mild left ventricular hypertrophy. Left ventricular diastolic parameters are indeterminate. Right Ventricle: The right ventricular size is normal. No increase in right ventricular wall thickness. Right ventricular systolic function is normal. Left Atrium: Left atrial size was moderately dilated. Right Atrium: Right atrial size was normal in size. Pericardium: There is no evidence of pericardial effusion. Mitral Valve: The mitral valve is normal in structure. Mild mitral valve regurgitation. Tricuspid Valve: The tricuspid valve is not well visualized. Tricuspid valve regurgitation is not demonstrated. Aortic Valve: Mean aortic valve gradient 86mHg, PG 334mg, Vmax 39m16m DVI=0.29. The aortic valve is calcified. Aortic valve regurgitation is mild. Aortic regurgitation PHT measures 581 msec. Moderate aortic stenosis is present. Aortic valve mean gradient measures 17.7 mmHg. Aortic valve peak gradient measures 31.8 mmHg. Aortic valve area, by VTI measures 1.04 cm. Pulmonic Valve: The pulmonic valve was not well visualized. Pulmonic valve regurgitation is not visualized. Aorta: The aortic root is normal in size and structure. Venous: The inferior vena cava was not well visualized. IAS/Shunts: No atrial level shunt detected by color flow Doppler.  LEFT VENTRICLE PLAX 2D LVIDd:         4.10 cm LVIDs:         2.90 cm LV PW:         1.10 cm LV IVS:        1.50 cm LVOT diam:     2.00 cm LV SV:         55 LV SV Index:   28 LVOT Area:     3.14 cm  RIGHT VENTRICLE RV Basal diam:  3.10 cm RV Mid  diam:    2.10 cm RV S prime:     11.40 cm/s TAPSE (M-mode): 1.8 cm LEFT ATRIUM             Index        RIGHT ATRIUM           Index LA diam:        4.70 cm 2.45 cm/m   RA Area:     16.30 cm LA Vol (A2C):   81.3 ml 42.32 ml/m  RA Volume:   40.30 ml  20.98 ml/m LA Vol (A4C):   88.5 ml 46.06 ml/m LA Biplane Vol: 85.7 ml 44.61 ml/m  AORTIC VALVE AV Area (Vmax):    0.86 cm AV Area (Vmean):   0.88 cm AV Area (VTI):     1.04 cm AV Vmax:           282.00 cm/s AV Vmean:          194.000 cm/s AV VTI:            0.527 m AV Peak Grad:      31.8 mmHg AV Mean Grad:      17.7 mmHg LVOT Vmax:         77.40 cm/s LVOT Vmean:        54.200 cm/s LVOT VTI:          0.174 m LVOT/AV VTI ratio: 0.33 AI PHT:            581 msec  AORTA Ao Root diam: 2.70 cm MITRAL VALVE               TRICUSPID VALVE MV Area (PHT): 5.88 cm    TR Peak grad:   17.6 mmHg MV Decel Time:  129 msec    TR Vmax:        210.00 cm/s MV E velocity: 98.00 cm/s                            SHUNTS                            Systemic VTI:  0.17 m                            Systemic Diam: 2.00 cm Kate Sable MD Electronically signed by Kate Sable MD Signature Date/Time: 09/27/2022/6:52:49 PM    Final    DG Chest Port 1 View  Result Date: 09/27/2022 CLINICAL DATA:  Shortness of breath, history COPD, hypertension, CHF, diabetes mellitus EXAM: PORTABLE CHEST 1 VIEW COMPARISON:  Portable exam 1211 hours compared to 09/26/2022 FINDINGS: Borderline enlargement of cardiac silhouette post CABG. Mediastinal contours and pulmonary vascularity normal. Eventration RIGHT diaphragm unchanged. No definite infiltrate, pleural effusion, or pneumothorax. Osseous structures unremarkable. IMPRESSION: No acute abnormalities. Electronically Signed   By: Lavonia Dana M.D.   On: 09/27/2022 13:39   CT HEAD WO CONTRAST (5MM)  Result Date: 09/26/2022 CLINICAL DATA:  Altered mental status EXAM: CT HEAD WITHOUT CONTRAST TECHNIQUE: Contiguous axial images were obtained from the  base of the skull through the vertex without intravenous contrast. RADIATION DOSE REDUCTION: This exam was performed according to the departmental dose-optimization program which includes automated exposure control, adjustment of the mA and/or kV according to patient size and/or use of iterative reconstruction technique. COMPARISON:  None Available. FINDINGS: Brain: There is no mass, hemorrhage or extra-axial collection. The size and configuration of the ventricles and extra-axial CSF spaces are normal. There is hypoattenuation of the white matter, most commonly indicating chronic small vessel disease. Old small vessel infarct of the right thalamus. Vascular: Atherosclerotic calcification of the internal carotid arteries at the skull base. No abnormal hyperdensity of the major intracranial arteries or dural venous sinuses. Skull: The visualized skull base, calvarium and extracranial soft tissues are normal. Sinuses/Orbits: No fluid levels or advanced mucosal thickening of the visualized paranasal sinuses. No mastoid or middle ear effusion. The orbits are normal. IMPRESSION: 1. No acute intracranial abnormality. 2. Chronic small vessel disease and old small vessel infarct of the right thalamus. Electronically Signed   By: Ulyses Jarred M.D.   On: 09/26/2022 20:54   CT ABDOMEN PELVIS W CONTRAST  Result Date: 09/26/2022 CLINICAL DATA:  Dizziness, weakness, diarrhea, found down EXAM: CT ABDOMEN AND PELVIS WITH CONTRAST TECHNIQUE: Multidetector CT imaging of the abdomen and pelvis was performed using the standard protocol following bolus administration of intravenous contrast. RADIATION DOSE REDUCTION: This exam was performed according to the departmental dose-optimization program which includes automated exposure control, adjustment of the mA and/or kV according to patient size and/or use of iterative reconstruction technique. CONTRAST:  119m OMNIPAQUE IOHEXOL 300 MG/ML  SOLN COMPARISON:  08/31/2021 FINDINGS: Lower  chest: No acute pleural or parenchymal lung disease. Hepatobiliary: Hepatic steatosis. No focal liver abnormality. Cholelithiasis without cholecystitis. Pancreas: Unremarkable. No pancreatic ductal dilatation or surrounding inflammatory changes. Spleen: Normal in size without focal abnormality. Adrenals/Urinary Tract: Stable appearance of the bilateral kidneys. No urinary tract calculi or obstructive uropathy. Bladder is moderately distended without filling defect or focal abnormality. The adrenals are stable. Stomach/Bowel: No bowel obstruction or ileus. No bowel wall thickening  or inflammatory change. Vascular/Lymphatic: Aortic atherosclerosis. No enlarged abdominal or pelvic lymph nodes. Reproductive: Prostate is unremarkable. Other: No free fluid or free intraperitoneal gas. Fat containing umbilical hernia unchanged. No bowel herniation. Musculoskeletal: No acute or destructive bony lesions. Reconstructed images demonstrate no additional findings. IMPRESSION: 1. Cholelithiasis without evidence of acute cholecystitis. 2. Fat containing umbilical hernia. 3. Otherwise stable appearance of the abdomen and pelvis without acute finding. 4.  Aortic Atherosclerosis (ICD10-I70.0). Electronically Signed   By: Randa Ngo M.D.   On: 09/26/2022 15:05   DG Chest Portable 1 View  Result Date: 09/26/2022 CLINICAL DATA:  Weakness. EXAM: PORTABLE CHEST 1 VIEW COMPARISON:  Chest x-ray February 5, 24. FINDINGS: Chronically elevated right hemidiaphragm. No definite consolidation. No visible pleural effusions or pneumothorax. No evidence of acute osseous abnormality. Similar cardiomediastinal silhouette. CABG and median sternotomy. IMPRESSION: No active disease. Electronically Signed   By: Margaretha Sheffield M.D.   On: 09/26/2022 12:44             LOS: 3 days      Emeterio Reeve, DO [facility] 09/29/2022, 3:38 PM    Dictation software may have been used to generate the above note. Typos may occur  and escape review in typed/dictated notes. Please contact Dr Sheppard Coil directly for clarity if needed.  Staff may message me via secure chat in [place]  but this may not receive an immediate response,  please page me for urgent matters!  If 7PM-7AM, please contact night coverage [email or web address]

## 2022-09-29 NOTE — Progress Notes (Signed)
Occupational Therapy Treatment Patient Details Name: OMERO GOBBLE MRN: [id number] DOB: January 29, 1956 Today's Date: 09/29/2022   History of present illness Pt is a 67 year old male admitted with SIRS (systemic inflammatory response syndrome) ([place]) / Severe sepsis unknown source, AMS; PMH significant for hyperlipidemia, paranoid schizophrenia, CAD, non-insulin-dependent diabetes mellitus, hypertension, obesity, diabetic retinopathy   OT comments  Chart reviewed, pt greeted in bed agreeable to OT tx session. Tx session targeted improving functional activity tolerance in the setting of ADL tasks/functional mobility. R hand noted with malodor and mild contracture, all finger nails with significant build up under them. Improvements noted during attempt at bed mobility with pt attempting to assits in sliding BLE towards edge of bed, however ultimately requiring MAX A. Attempted STS with MAX A with RW, pt unable to clear buttocks off bed. Improvements also noted in static sitting balance at edge of bed, with pt maintaining for approx 10 minutes with CGA- close supervision.Discharge recommendation remains appropriate. OT will continue to follow acutely.    Recommendations for follow up therapy are one component of a multi-disciplinary discharge planning process, led by the attending physician.  Recommendations may be updated based on patient status, additional functional criteria and insurance authorization.    Follow Up Recommendations  Skilled nursing-short term rehab (<3 hours/day)     Assistance Recommended at Discharge Frequent or constant Supervision/Assistance  Patient can return home with the following  Two people to help with walking and/or transfers;A lot of help with walking and/or transfers   Equipment Recommendations  Other (comment) (per next venue of care)    Recommendations for Other Services      Precautions / Restrictions Precautions Precautions: Fall Restrictions Weight Bearing  Restrictions: No       Mobility Bed Mobility Overal bed mobility: Needs Assistance Bed Mobility: Supine to Sit, Sit to Supine     Supine to sit: Max assist, HOB elevated Sit to supine: Max assist, HOB elevated   General bed mobility comments: +2 TOTAL A for boost in bed; improvements noted in supine>sit with pt attempting to move BLE along the bed, ultimately requiring max A however    Transfers Overall transfer level: Needs assistance Equipment used: Rolling walker (2 wheels) Transfers: Sit to/from Stand Sit to Stand: Max assist           General transfer comment: unable to clear buttocks off bed     Balance Overall balance assessment: Needs assistance Sitting-balance support: Single extremity supported Sitting balance-Leahy Scale: Fair   Postural control: Right lateral lean Standing balance support: Bilateral upper extremity supported, During functional activity, Reliant on assistive device for balance Standing balance-Leahy Scale: Zero                             ADL either performed or assessed with clinical judgement   ADL Overall ADL's : Needs assistance/impaired                                       General ADL Comments: Grooming tasks at edge of bed with MOD-MAX A due to challenge to sitting balance, pt with build up under all finger nails which is cleaned via total A from this therapist.    Extremity/Trunk Assessment Upper Extremity Assessment RUE Deficits / Details: mild R hand contracture noted on this date with malodor and build up under all finger  nails noted            Vision Patient Visual Report: No change from baseline     Perception     Praxis      Cognition Arousal/Alertness: Awake/alert Behavior During Therapy: Flat affect Overall Cognitive Status: No family/caregiver present to determine baseline cognitive functioning Area of Impairment: Orientation, Attention, Following commands, Safety/judgement,  Awareness                 Orientation Level: Disoriented to, Time, Situation ("I came here because I got evicted") Current Attention Level: Focused Memory: Decreased short-term memory Following Commands: Follows one step commands with increased time Safety/Judgement: Decreased awareness of safety, Decreased awareness of deficits Awareness: Intellectual            Exercises      Shoulder Instructions       General Comments vitals appear stable throughout    Pertinent Vitals/ Pain       Pain Assessment Pain Assessment: No/denies pain  Home Living                                          Prior Functioning/Environment              Frequency  Min 2X/week        Progress Toward Goals  OT Goals(current goals can now be found in the care plan section)  Progress towards OT goals: Progressing toward goals     Plan Discharge plan remains appropriate;Frequency remains appropriate    Co-evaluation                 AM-PAC OT "6 Clicks" Daily Activity     Outcome Measure   Help from another person eating meals?: A Little Help from another person taking care of personal grooming?: A Little Help from another person toileting, which includes using toliet, bedpan, or urinal?: Total Help from another person bathing (including washing, rinsing, drying)?: Total Help from another person to put on and taking off regular upper body clothing?: A Little Help from another person to put on and taking off regular lower body clothing?: A Lot 6 Click Score: 13    End of Session Equipment Utilized During Treatment: Rolling walker (2 wheels)  OT Visit Diagnosis: Other abnormalities of gait and mobility (R26.89);Unsteadiness on feet (R26.81);Muscle weakness (generalized) (M62.81)   Activity Tolerance Patient tolerated treatment well   Patient Left in bed;with call bell/phone within reach;with bed alarm set   Nurse Communication Mobility status         Time: [id number] OT Time Calculation (min): 15 min  Charges: OT General Charges $OT Visit: 1 Visit OT Treatments $Self Care/Home Management : 8-22 mins  Shanon Payor, OTD OTR/L  09/29/22, 3:08 PM

## 2022-09-29 NOTE — Consult Note (Signed)
Client was in a procedure (EEG), psych will continue to follow and adjust medications.  Discontinued Haldol 1 mg at bedtime and increased his Risperdal 0.5 mg BID to 1 mg BID.  Psych will continue to follow.  Waylan Boga, PMHNP

## 2022-09-29 NOTE — Consult Note (Addendum)
Neurology Consultation  Reason for Consult: Tremors and altered mental status Referring Physician: Dr. Sheppard Coil  CC: Tremors, altered mental status  History is obtained from: Patient, chart  HPI: Evan [name] is a 67 y.o. male significant medical history of paranoid schizophrenia on Risperdal and Haldol, atrial fibrillation on Xarelto, COPD, hyperlipidemia, hypertension, prior history of thalamic stroke with residual weakness symptoms which she says are bilateral, admitted for evaluation of hypotension, possible SIRS/sepsis unknown source and altered mental status that is improving but he continues to complain of tremors as well as numbness in bilateral lower extremities as well as weakness of the bilateral lower extremities since his mentation has improved. The patient has been followed by the medicine team since his admission and the hospitalist has noticed that his mentation that had been ordered is somewhat improving but he still seems altered-unclear baseline.  There were also concerned about tremulousness and some stiffness that he is exhibiting in his upper extremities as well as weakness and numbness that he describes in the lower extremities. Patient is not a very good reliable historian to be able to provide consistent history.  Prior history reviewed.  Prior imaging reviewed.  April 2022 had a acute/subacute right thalamic infarct-note reviewed, presented with generalized weakness.  When this was reviewed with him, he said that he did feel generally weak when he had the last stroke but he has residual off-and-on generalized weakness symptoms since then.  Further back review of chart shows that he has had trouble living independently for many years.  He resides in a group home.  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    CAD (coronary artery disease)    Cellulitis    CHF (congestive heart failure) (HCC)    NYHA CLASS III,CHRONIC,DIASTOLIC   COPD (chronic obstructive pulmonary disease) ([place])    COVID-19    Diabetes mellitus without complication (H[facility])    Edema    RIGHT LOWER LEG   Gastroesophageal reflux    H/O: GI bleed    History of pneumonia    Remote   History of scarlet fever    Childhood   Hyperlipidemia    Hypertension    Myocardial infarction ([place]) 2009   Obesity    Obstructive sleep apnea    Pain    CHRONIC BACK / ABDOMINAL   Panic disorder    Peripheral venous insufficiency    PTSD (post-traumatic stress disorder)    Retinopathy    DIABETIC   Stasis, venous    Stroke ([place])    Vertigo      Family History  Problem Relation Age of Onset   Heart attack Mother    Hypertension Mother    Hyperlipidemia Mother    Heart attack Brother 26       MI   Coronary artery disease Other      Social History:   reports that he has never smoked. He has never used smokeless tobacco. He reports that he does not drink alcohol and does not use drugs.  Medications  Current Facility-Administered Medications:    0.9 %  sodium chloride infusion, 250  mL, Intravenous, Continuous, Emeterio Reeve, DO, Held at 09/27/22 1211   acetaminophen (TYLENOL) tablet 650 mg, 650 mg, Oral, Q6H PRN **OR** acetaminophen (TYLENOL) suppository 650 mg, 650 mg, Rectal, Q6H PRN, Cox, Amy N, DO   albuterol (PROVENTIL) (2.5 MG/3ML) 0.083% nebulizer solution 2.5 mg, 2.5 mg, Nebulization, Q6H PRN, Cox, Amy N, DO   aspirin EC tablet 81 mg, 81 mg, Oral, Daily, Cox, Amy N, DO, 81 mg at 09/29/22 [id number]   cefTRIAXone (ROCEPHIN) 2 g in sodium chloride 0.9 % 100 mL IVPB, 2 g, Intravenous, Q24H, Benita Gutter, RPH, Last Rate:  200 mL/hr at 09/29/22 1036, 2 g at 09/29/22 1036   Chlorhexidine Gluconate Cloth 2 % PADS 6 each, 6 each, Topical, Daily, Emeterio Reeve, DO, 6 each at 09/29/22 [id number]   feeding supplement (ENSURE ENLIVE / ENSURE PLUS) liquid 237 mL, 237 mL, Oral, BID BM, Emeterio Reeve, DO, 237 mL at 09/29/22 [id number]   haloperidol (HALDOL) tablet 1 mg, 1 mg, Oral, QHS, Cox, Amy N, DO, 1 mg at 09/28/22 2156   hydrocortisone sodium succinate (SOLU-CORTEF) 100 MG injection 100 mg, 100 mg, Intravenous, Q12H, Rust-Chester, Britton L, NP, 100 mg at 09/29/22 1156   insulin aspart (novoLOG) injection 0-5 Units, 0-5 Units, Subcutaneous, QHS, Cox, Amy N, DO   insulin aspart (novoLOG) injection 0-9 Units, 0-9 Units, Subcutaneous, TID WC, Cox, Amy N, DO, 2 Units at 09/29/22 1156   melatonin tablet 5 mg, 5 mg, Oral, QHS PRN, Cox, Amy N, DO   menthol-cetylpyridinium (CEPACOL) lozenge 3 mg, 1 lozenge, Oral, PRN, Cox, Amy N, DO   midodrine (PROAMATINE) tablet 10 mg, 10 mg, Oral, TID WC, Teressa Lower, NP, 10 mg at 09/29/22 1156   mometasone-formoterol (DULERA) 100-5 MCG/ACT inhaler 2 puff, 2 puff, Inhalation, BID, Cox, Amy N, DO, 2 puff at 09/29/22 0830   multivitamin with minerals tablet 1 tablet, 1 tablet, Oral, Daily, Emeterio Reeve, DO, 1 tablet at 09/29/22 [id number]   nitroGLYCERIN (NITROSTAT) SL tablet 0.4 mg, 0.4 mg, Sublingual, Q5 min PRN, Cox, Amy N, DO   nystatin cream (MYCOSTATIN), , Topical, BID PRN, Cox, Amy N, DO   ondansetron (ZOFRAN) tablet 4 mg, 4 mg, Oral, Q6H PRN **OR** ondansetron (ZOFRAN) injection 4 mg, 4 mg, Intravenous, Q6H PRN, Cox, Amy N, DO   pantoprazole (PROTONIX) EC tablet 40 mg, 40 mg, Oral, QAC breakfast, Cox, Amy N, DO, 40 mg at 09/29/22 [id number]   risperiDONE (RISPERDAL) tablet 0.5 mg, 0.5 mg, Oral, BID, Cox, Amy N, DO, 0.5 mg at 09/29/22 [id number]   rivaroxaban (XARELTO) tablet 20 mg, 20 mg, Oral, Q supper, Cox, Amy N, DO, 20 mg at 09/28/22 1730   senna (SENOKOT) tablet 17.2 mg, 2 tablet, Oral, QHS,  Cox, Amy N, DO, 17.2 mg at 09/28/22 2156   senna-docusate (Senokot-S) tablet 1 tablet, 1 tablet, Oral, QHS PRN, Cox, Amy N, DO   simvastatin (ZOCOR) tablet 20 mg, 20 mg, Oral, QHS, Cox, Amy N, DO, 20 mg at 09/28/22 2157   Exam: Current vital signs: BP 109/72 (BP Location: Left Arm)   Pulse (!) 102   Temp 98.2 F (36.8 C)   Resp 18   Ht '5\' 5"'$  (1.651 m)   Wt 88.4 kg   SpO2 99%   BMI 32.43 kg/m  Vital signs in last 24 hours: Temp:  [97.8 F (36.6 C)-98.4 F (36.9 C)] 98.2 F (36.8 C) (02/23 0953) Pulse Rate:  [80-120] 102 (02/23 0953) Resp:  [12-28] 18 (02/23 0953) BP: (88-133)/(51-91) 109/72 (02/23  [id number]) SpO2:  [95 %-100 %] 99 % (02/23 0953) Weight:  [88.4 kg] 88.4 kg (02/23 0500) General: Awake alert in no apparent distress HEENT: Normocephalic atraumatic Lungs: Clear Cardiovascular: Regular rhythm Abdomen nondistended nontender Neurological exam He is awake alert In no distress Speech is not dysarthric but he has poor attention concentration He has no evidence of aphasia Cranial nerves II to XII intact Motor examination with increased tone and somewhat paratonia on the right upper extremity.  He has what initially felt like cogwheeling but with coaching he lets go and has normal tone. Strength is equal and symmetric in the upper extremities.  Bilateral lower extremities are barely 2/5.  Unclear if that is effort dependent or since how long that has been present Sensation intact to light touch Coordination: No dysmetria, has a resting tremor on the left arm compared to the right   Labs I have reviewed labs in epic and the results pertinent to this consultation are: CBC    Component Value Date/Time   WBC 13.2 (H) 09/29/2022 0546   RBC 3.82 (L) 09/29/2022 0546   HGB 10.8 (L) 09/29/2022 0546   HGB 14.8 11/10/2021 0936   HCT 33.7 (L) 09/29/2022 0546   HCT 45.1 11/10/2021 0936   PLT 378 09/29/2022 0546   PLT 236 11/10/2021 0936   MCV 88.2 09/29/2022 0546   MCV 88  11/10/2021 0936   MCV 86 11/29/2014 1120   MCH 28.3 09/29/2022 0546   MCHC 32.0 09/29/2022 0546   RDW 14.7 09/29/2022 0546   RDW 14.1 11/10/2021 0936   RDW 15.4 (H) 11/29/2014 1120   LYMPHSABS 0.8 09/29/2022 0546   LYMPHSABS 1.6 11/10/2021 0936   LYMPHSABS 1.5 11/29/2014 1120   MONOABS 0.4 09/29/2022 0546   MONOABS 0.8 11/29/2014 1120   EOSABS 0.0 09/29/2022 0546   EOSABS 0.2 11/10/2021 0936   EOSABS 0.1 11/29/2014 1120   BASOSABS 0.0 09/29/2022 0546   BASOSABS 0.1 11/10/2021 0936   BASOSABS 0.1 11/29/2014 1120    CMP     Component Value Date/Time   NA 135 09/29/2022 0546   NA 138 11/10/2021 0936   NA 137 11/29/2014 1120   K 3.6 09/29/2022 0546   K 4.0 11/29/2014 1120   CL 106 09/29/2022 0546   CL 104 11/29/2014 1120   CO2 22 09/29/2022 0546   CO2 23 11/29/2014 1120   GLUCOSE 159 (H) 09/29/2022 0546   GLUCOSE 151 (H) 11/29/2014 1120   BUN 21 09/29/2022 0546   BUN 18 11/10/2021 0936   BUN 21 (H) 11/29/2014 1120   CREATININE 0.72 09/29/2022 0546   CREATININE 1.20 11/29/2014 1120   CALCIUM 8.3 (L) 09/29/2022 0546   CALCIUM 9.0 11/29/2014 1120   PROT 5.4 (L) 09/28/2022 0620   PROT 7.0 11/10/2021 0936   PROT 7.9 11/29/2014 1120   ALBUMIN 2.3 (L) 09/28/2022 0620   ALBUMIN 4.3 11/10/2021 0936   ALBUMIN 4.2 11/29/2014 1120   AST 59 (H) 09/28/2022 0620   AST 36 11/29/2014 1120   ALT 30 09/28/2022 0620   ALT 40 11/29/2014 1120   ALKPHOS 73 09/28/2022 0620   ALKPHOS 77 11/29/2014 1120   BILITOT 1.2 09/28/2022 0620   BILITOT 0.6 11/10/2021 0936   BILITOT 0.7 11/29/2014 1120   GFRNONAA >60 09/29/2022 0546   GFRNONAA >60 11/29/2014 1120   GFRAA 105 07/08/2020 1002   GFRAA >60 11/29/2014 1120   Imaging I have reviewed the images obtained: CT head: 09/26/2022: No acute abnormality.  Assessment: 67 year old man  with past history of paranoid schizophrenia on Risperdal and Haldol, atrial fibrillation on Xarelto, COPD, hyperlipidemia, hypertension, right thalamic stroke  with bilateral weakness and residual per patient, admitted for evaluation of hypertension, possible SIRS/sepsis unknown source and altered mental status that has been improving as the medicine team treats them but there is concern for altered mental status still along with parkinsonian symptoms. I do feel that he has some extrapyramidal symptoms and looking at his medication list, I can only think of Haldol and Risperdal being used together as contributing to it. That said, due to the prior history of small vessel stroke, I would repeat an MRI of the brain to ensure that he did not have any new strokes. Altered mentation could be secondary to the underlying psychiatric disorder versus medications versus toxic metabolic encephalopathy  Impression: Possible extrapyramidal symptoms from antipsychotics Toxic metabolic encephalopathy Evaluate for stroke - low on differentials  Recommendations: I would recommend touching base with psychiatry regarding alternatives to the first generation antipsychotics since daily being used in conjunction with second-generation antipsychotics which increases the risk for side effects. MRI brain without contrast I really do not think he is having seizures but I will order an EEG to ensure that there is no other electrographic abnormality underlying which may be causing altered mentation. Continue management of toxic metabolic derangements per the medicine team as you are B12, ammonia, TSH  I discussed my plan with Dr. Sheppard Coil in person I will follow with you tomorrow.   -- Amie Portland, MD Neurologist [facility] Pager: [phone]

## 2022-09-30 ENCOUNTER — Inpatient Hospital Stay: Payer: 59

## 2022-09-30 ENCOUNTER — Other Ambulatory Visit: Payer: Self-pay

## 2022-09-30 DIAGNOSIS — F203 Undifferentiated schizophrenia: Secondary | ICD-10-CM

## 2022-09-30 LAB — BASIC METABOLIC PANEL
Anion gap: 7 (ref 5–15)
BUN: 22 mg/dL (ref 8–23)
CO2: 22 mmol/L (ref 22–32)
Calcium: 8.5 mg/dL — ABNORMAL LOW (ref 8.9–10.3)
Chloride: 106 mmol/L (ref 98–111)
Creatinine, Ser: 0.72 mg/dL (ref 0.61–1.24)
GFR, Estimated: >60 mL/min (ref >60)
Glucose, Bld: 113 mg/dL — ABNORMAL HIGH (ref 70–99)
Potassium: 3.4 mmol/L — ABNORMAL LOW (ref 3.5–5.1)
Sodium: 135 mmol/L (ref 135–145)

## 2022-09-30 LAB — GLUCOSE, CAPILLARY
Glucose-Capillary: 115 mg/dL — ABNORMAL HIGH (ref 70–99)
Glucose-Capillary: 163 mg/dL — ABNORMAL HIGH (ref 70–99)
Glucose-Capillary: 136 mg/dL — ABNORMAL HIGH (ref 70–99)
Glucose-Capillary: 111 mg/dL — ABNORMAL HIGH (ref 70–99)

## 2022-09-30 LAB — CBC
HCT: 36 % — ABNORMAL LOW (ref 39.0–52.0)
Hemoglobin: 11.6 g/dL — ABNORMAL LOW (ref 13.0–17.0)
MCH: 28.8 pg (ref 26.0–34.0)
MCHC: 32.2 g/dL (ref 30.0–36.0)
MCV: 89.3 fL (ref 80.0–100.0)
Platelets: 391 10*3/uL (ref 150–400)
RBC: 4.03 MIL/uL — ABNORMAL LOW (ref 4.22–5.81)
RDW: 14.7 % (ref 11.5–15.5)
WBC: 11.8 10*3/uL — ABNORMAL HIGH (ref 4.0–10.5)
nRBC: 0 % (ref 0.0–0.2)

## 2022-09-30 LAB — VITAMIN B12: Vitamin B-12: 501 pg/mL (ref 180–914)

## 2022-09-30 LAB — TSH: TSH: 2.036 u[IU]/mL (ref 0.350–4.500)

## 2022-09-30 LAB — AMMONIA: Ammonia: 15 umol/L (ref 9–35)

## 2022-09-30 NOTE — Progress Notes (Signed)
PROGRESS NOTE    Evan [name]   [id number] DOB: 1955/09/16  DOA: 09/26/2022 Date of Service: 09/30/22 PCP: [name], FNP     Brief Narrative / Hospital Course:  Evan [name] is a 67 year old male with history of hyperlipidemia, paranoid schizophrenia, CAD, non-insulin-dependent diabetes mellitus, hypertension, obesity, diabetic retinopathy, who presents emergency department for chief concerns of dizziness from [facility] via EMS. 02/20: afebrile, RR 21, tachycardic at HR 120, blood pressure 101/72, SpO2 94% on room air. Serum creatinine 1.55, EGFR 48, WBC 18.5, hemoglobin 14.3, platelets 512, Lactic acid was 3.3 and on repeat was 3.0. COVID/influenza A/influenza B/RSV PCR were negative. UA was negative for leukocytes and nitrates. Blood cultures x 2 have been ordered and are in process.CT abdomen pelvis with contrast: Was read as cholelithiasis without evidence of acute cholecystitis.  Fat-containing umbilical hernia.  Otherwise stable appearance of the abdomen pelvis without acute finding.  Aortic atherosclerosis. ED treatment: Cefepime and vancomycin per pharmacy.  LR 1 L bolus. Admitted to hospitalist service for sepsis unknown source. 02/21: hypotensive overnight, responsive to fluid bolus, BP still low in AM, started midodrine and gave another 1L bolus, BP held briefly --> pt transferred to stepdown for norepi gtt. C/o chest pressure, (+)murmur on cardiac exam, ?rales/atelectasis --> Echo, BNP elevated at 428, CXR, Troponin, EKG. Eval acidosis --> VBG was ok 02/22: BP improving, off norepi approx 10:00 am, scheduled midodrine and stress dose steroids (Cortisol level 20).  BCx NGx2d, GI PCR and CDiff pending, repeat BNP higher at 988, lactic and WBC coming down. Echo LVEF 50-55% w/ low normal fxn, mild LVH, indeterminate diastolic, moderate hypokinesis inferolateral LV, moderate aortic valve stenosis. Net IO Since Admission: 3,207.89 mL [09/28/22 1440] 02/23: BP remains  stable but soft. Neurology eval - Possible extrapyramidal symptoms from antipsychotics --> request psych eval for rx mgt and d/c haldol, MRI brain ordered and EEG though CVA/seizures unlikely. EEG no concerns. Discontinued Haldol 1 mg at bedtime and increased his Risperdal 0.5 mg BID to 1 mg BID per psychiatry.  02/24: BP remains stable. WBC coming down now 11.8. MRI brain pending. B12 pending. Ammonia and TSH WNL. Will need outpatient neurological follow up.    Consultants:  Neurology Psychiatry   Procedures: none      ASSESSMENT & PLAN:   Principal Problem:   SIRS (systemic inflammatory response syndrome) (HCC) Active Problems:   Hypertension associated with diabetes ([place])   Esophageal dysmotility   Chronic diastolic CHF (congestive heart failure) (HCC)   Paranoid schizophrenia ([place])   Type 2 diabetes mellitus with morbid obesity (HCC)   Obstructive sleep apnea on CPAP   Hyperlipidemia associated with type 2 diabetes mellitus (HCC)   Persistent atrial fibrillation (HCC)   CAD (coronary artery disease)   Hoarding disorder   COPD, mild (HCC)   Atherosclerosis of aorta (HCC)   History of CVA (cerebrovascular accident)   Undifferentiated schizophrenia ([place])   Leukocytosis   AKI (acute kidney injury) ([place])   Shock circulatory (HCC)   Elevated lactic acid level   Left ventricular hypokinesis inferolateral wall   Moderate aortic stenosis   Hypotension - improved  SIRS (systemic inflammatory response syndrome) (HCC) / Severe sepsis unknown source Altered mental status - improved  Uncertain etiology Follow BCx --> NG TSH WNL  Continue with cefepime and vancomycin per pharmacy, total 5-7 days abx  Continue scheduled midodrine - trial wean as able  stress dose steroids initiated by ICU team, these have been d/c  Left ventricular hypokinesis  inferolateral wall Moderate aortic stenosis Consider cardiology consult if worse, otherwise should be able to follow outpatient   Consider lower dose Lasix if BP tolerates  Strict I&O  Tremor, question parkinsonism (primary vs med effect) Pt reprots numbness lower extremities and "can't move" but then able to move somewhat, question neuro sensory/motor deficit Possible extrapyramidal symptoms from antipsychotics  EEG and TSH WNL Discontinued Haldol 1 mg at bedtime and increased his Risperdal 0.5 mg BID to 1 mg BID per psychiatry MRI brain pending  B12, ammonia pending Neurology to follow outpatient / pending MRI   AKI (acute kidney injury) ([place]) - resolved Monitor BMP   Leukocytosis Etiology workup in progress, treat per above Monitor CBC   History of CVA (cerebrovascular accident) aspirin 81 mg daily simvastatin 20 mg nightly  Essential Hypertension Holding home metoprolol succinate 50 mg daily at this time due to possible sepsis/severe sepsis presentation Hydralazine 5 mg IV every 8 hours as needed for SBP > 180, 4 days ordered   COPD, mild (HCC) Not in acute exacerbation at this time Albuterol nebulizer, every 6 hours as needed for wheezing and shortness of breath, 5 days ordered   Hyperlipidemia associated with type 2 diabetes mellitus (HCC) Simvastatin 20 mg nightly resumed   Paranoid schizophrenia [facility]) Psychiatry consulted  Concern for haldol causing extrapyramidal symptoms  Discontinued Haldol 1 mg at bedtime  Continue Risperdal at increased dose of 1 mg BID per psychiatry   History Atrial Fibrillation Continue Xarelto  Beta blocker on hold d/t hypotension    DVT prophylaxis: Xarelto Pertinent IV fluids/nutrition: no conitnuous IV fluids  Central lines / invasive devices: none  Code Status: FULL CODE   Current Admission Status: inpatient  TOC needs / Dispo plan: SNF placement Barriers to discharge / significant pending items: await placement, MRI, EEG, psychiatry eval - likely will be here thru the weekend (today is Saturday)              Subjective / Brief ROS:   Patient has no concerns today other than feeling like he has to go to the bathroom.     Family Communication: none at this time, will reach out to his guardian Monday re placement / pending results     Objective Findings:  Vitals:   09/30/22 0427 09/30/22 0429 09/30/22 0759 09/30/22 1130  BP: 118/74  102/60 111/75  Pulse: 77  74 95  Resp: '20  18 16  '$ Temp: 98.2 F (36.8 C)  98.2 F (36.8 C) 98.1 F (36.7 C)  TempSrc: Oral     SpO2: 97%  100% 98%  Weight:  92.1 kg    Height:        Intake/Output Summary (Last 24 hours) at 09/30/2022 1420 Last data filed at 09/30/2022 1245 Gross per 24 hour  Intake 840 ml  Output 1000 ml  Net -160 ml   Filed Weights   09/27/22 1206 09/29/22 0500 09/30/22 0429  Weight: 84.7 kg 88.4 kg 92.1 kg    Examination:  Physical Exam Constitutional:      General: He is not in acute distress.    Appearance: Normal appearance. He is not toxic-appearing.  HENT:     Left Ear: External ear normal.  Eyes:     Extraocular Movements: Extraocular movements intact.  Cardiovascular:     Rate and Rhythm: Normal rate and regular rhythm.     Heart sounds: Murmur heard.  Pulmonary:     Effort: No respiratory distress.     Breath sounds:  No rales.  Abdominal:     General: Abdomen is flat.     Palpations: Abdomen is soft.     Tenderness: There is no abdominal tenderness.  Musculoskeletal:     Right lower leg: No edema.     Left lower leg: No edema.  Skin:    General: Skin is warm and dry.  Neurological:     General: No focal deficit present.     Mental Status: He is alert. He is disoriented.     Cranial Nerves: No cranial nerve deficit.     Sensory: No sensory deficit.     Motor: Weakness present.  Psychiatric:        Mood and Affect: Mood normal.        Behavior: Behavior normal.          Scheduled Medications:   aspirin EC  81 mg Oral Daily   Chlorhexidine Gluconate Cloth  6 each Topical Daily   feeding supplement  237 mL Oral BID  BM   insulin aspart  0-5 Units Subcutaneous QHS   insulin aspart  0-9 Units Subcutaneous TID WC   midodrine  10 mg Oral TID WC   mometasone-formoterol  2 puff Inhalation BID   multivitamin with minerals  1 tablet Oral Daily   pantoprazole  40 mg Oral QAC breakfast   risperiDONE  1 mg Oral BID   rivaroxaban  20 mg Oral Q supper   senna  2 tablet Oral QHS   simvastatin  20 mg Oral QHS    Continuous Infusions:  sodium chloride Stopped (09/27/22 1211)    PRN Medications:  acetaminophen **OR** acetaminophen, albuterol, melatonin, menthol-cetylpyridinium, nitroGLYCERIN, nystatin cream, ondansetron **OR** ondansetron (ZOFRAN) IV, senna-docusate  Antimicrobials from admission:  Anti-infectives (From admission, onward)    Start     Dose/Rate Route Frequency Ordered Stop   09/29/22 1000  cefTRIAXone (ROCEPHIN) 2 g in sodium chloride 0.9 % 100 mL IVPB        2 g 200 mL/hr over 30 Minutes Intravenous Every 24 hours 09/28/22 1110 09/30/22 1026   09/27/22 1100  vancomycin (VANCOCIN) IVPB 1000 mg/200 mL premix  Status:  Discontinued        1,000 mg 200 mL/hr over 60 Minutes Intravenous Every 12 hours 09/27/22 1025 09/28/22 1108   09/27/22 1015  ceFEPIme (MAXIPIME) 2 g in sodium chloride 0.9 % 100 mL IVPB        2 g 200 mL/hr over 30 Minutes Intravenous Every 8 hours 09/27/22 1012 09/28/22 2230   09/27/22 0030  ceFEPIme (MAXIPIME) 2 g in sodium chloride 0.9 % 100 mL IVPB  Status:  Discontinued        2 g 200 mL/hr over 30 Minutes Intravenous Every 12 hours 09/26/22 1647 09/27/22 1012   09/26/22 1630  vancomycin (VANCOREADY) IVPB 750 mg/150 mL        750 mg 150 mL/hr over 60 Minutes Intravenous  Once 09/26/22 1546 09/26/22 1931   09/26/22 1230  ceFEPIme (MAXIPIME) 2 g in sodium chloride 0.9 % 100 mL IVPB        2 g 200 mL/hr over 30 Minutes Intravenous  Once 09/26/22 1220 09/26/22 1302   09/26/22 1230  vancomycin (VANCOCIN) IVPB 1000 mg/200 mL premix        1,000 mg 200 mL/hr over 60  Minutes Intravenous  Once 09/26/22 1220 09/26/22 1410           Data Reviewed:  I have personally reviewed the following...  CBC:  Recent Labs  Lab 09/26/22 1010 09/27/22 0529 09/28/22 0620 09/29/22 0546 09/30/22 0445  WBC 18.5* 13.2* 13.0* 13.2* 11.8*  NEUTROABS  --   --   --  11.8*  --   HGB 14.3 11.7* 11.7* 10.8* 11.6*  HCT 44.7 36.8* 37.1* 33.7* 36.0*  MCV 88.7 90.4 90.7 88.2 89.3  PLT 512* 393 396 378 0000000   Basic Metabolic Panel: Recent Labs  Lab 09/26/22 1010 09/27/22 0529 09/27/22 1937 09/28/22 0620 09/29/22 0546 09/30/22 0445  NA 137 136  --  137 135 135  K 3.7 3.4* 3.3* 4.0 3.6 3.4*  CL 104 106  --  108 106 106  CO2 18* 20*  --  18* 22 22  GLUCOSE 151* 120*  --  124* 159* 113*  BUN 34* 24*  --  '15 21 22  '$ CREATININE 1.55* 0.86  --  0.77 0.72 0.72  CALCIUM 9.2 8.3*  --  8.4* 8.3* 8.5*  MG  --   --  2.1 2.1 2.2  --   PHOS  --   --  2.9 3.0 2.6  --    GFR: Estimated Creatinine Clearance: 92.1 mL/min (by C-G formula based on SCr of 0.72 mg/dL). Liver Function Tests: Recent Labs  Lab 09/27/22 1449 09/28/22 0620  AST 78* 59*  ALT 31 30  ALKPHOS 72 73  BILITOT 0.9 1.2  PROT 5.2* 5.4*  ALBUMIN 2.3* 2.3*   No results for input(s): "LIPASE", "AMYLASE" in the last 168 hours. Recent Labs  Lab 09/30/22 0445  AMMONIA 15   Coagulation Profile: No results for input(s): "INR", "PROTIME" in the last 168 hours. Cardiac Enzymes: No results for input(s): "CKTOTAL", "CKMB", "CKMBINDEX", "TROPONINI" in the last 168 hours. BNP (last 3 results) No results for input(s): "PROBNP" in the last 8760 hours. HbA1C: No results for input(s): "HGBA1C" in the last 72 hours.  CBG: Recent Labs  Lab 09/29/22 1126 09/29/22 1652 09/29/22 2103 09/30/22 0825 09/30/22 1154  GLUCAP 182* 172* 146* 115* 163*   Lipid Profile: No results for input(s): "CHOL", "HDL", "LDLCALC", "TRIG", "CHOLHDL", "LDLDIRECT" in the last 72 hours. Thyroid Function Tests: Recent Labs     09/30/22 0445  TSH 2.036   Anemia Panel: Recent Labs    09/30/22 0445  VITAMINB12 501   Most Recent Urinalysis On File:     Component Value Date/Time   COLORURINE YELLOW (A) 09/26/2022 1117   APPEARANCEUR HAZY (A) 09/26/2022 1117   APPEARANCEUR Clear 09/20/2021 0856   LABSPEC 1.013 09/26/2022 1117   LABSPEC 1.013 11/29/2014 1120   PHURINE 5.0 09/26/2022 1117   GLUCOSEU >=500 (A) 09/26/2022 1117   GLUCOSEU Negative 11/29/2014 1120   HGBUR LARGE (A) 09/26/2022 1117   BILIRUBINUR NEGATIVE 09/26/2022 1117   BILIRUBINUR Negative 09/20/2021 0856   BILIRUBINUR Negative 11/29/2014 1120   KETONESUR NEGATIVE 09/26/2022 1117   PROTEINUR 30 (A) 09/26/2022 1117   NITRITE NEGATIVE 09/26/2022 1117   LEUKOCYTESUR NEGATIVE 09/26/2022 1117   LEUKOCYTESUR Negative 11/29/2014 1120   Sepsis Labs: '@LABRCNTIP'$ (procalcitonin:4,lacticidven:4) Microbiology: Recent Results (from the past 240 hour(s))  Resp panel by RT-PCR (RSV, Flu A&B, Covid) Anterior Nasal Swab     Status: None   Collection Time: 09/26/22 10:03 AM   Specimen: Anterior Nasal Swab  Result Value Ref Range Status   SARS Coronavirus 2 by RT PCR NEGATIVE NEGATIVE Final    Comment: (NOTE) SARS-CoV-2 target nucleic acids are NOT DETECTED.  The SARS-CoV-2 RNA is generally detectable in upper respiratory specimens during the acute phase of  infection. The lowest concentration of SARS-CoV-2 viral copies this assay can detect is 138 copies/mL. A negative result does not preclude SARS-Cov-2 infection and should not be used as the sole basis for treatment or other patient management decisions. A negative result may occur with  improper specimen collection/handling, submission of specimen other than nasopharyngeal swab, presence of viral mutation(s) within the areas targeted by this assay, and inadequate number of viral copies(<138 copies/mL). A negative result must be combined with clinical observations, patient history, and  epidemiological information. The expected result is Negative.  Fact Sheet for Patients:  EntrepreneurPulse.com.au  Fact Sheet for Healthcare Providers:  IncredibleEmployment.be  This test is no t yet approved or cleared by the [place] FDA and  has been authorized for detection and/or diagnosis of SARS-CoV-2 by FDA under an Emergency Use Authorization (EUA). This EUA will remain  in effect (meaning this test can be used) for the duration of the COVID-19 declaration under Section 564(b)(1) of the Act, 21 U.S.C.section 360bbb-3(b)(1), unless the authorization is terminated  or revoked sooner.       Influenza A by PCR NEGATIVE NEGATIVE Final   Influenza B by PCR NEGATIVE NEGATIVE Final    Comment: (NOTE) The Xpert Xpress SARS-CoV-2/FLU/RSV plus assay is intended as an aid in the diagnosis of influenza from Nasopharyngeal swab specimens and should not be used as a sole basis for treatment. Nasal washings and aspirates are unacceptable for Xpert Xpress SARS-CoV-2/FLU/RSV testing.  Fact Sheet for Patients: EntrepreneurPulse.com.au  Fact Sheet for Healthcare Providers: IncredibleEmployment.be  This test is not yet approved or cleared by the [place] FDA and has been authorized for detection and/or diagnosis of SARS-CoV-2 by FDA under an Emergency Use Authorization (EUA). This EUA will remain in effect (meaning this test can be used) for the duration of the COVID-19 declaration under Section 564(b)(1) of the Act, 21 U.S.C. section 360bbb-3(b)(1), unless the authorization is terminated or revoked.     Resp Syncytial Virus by PCR NEGATIVE NEGATIVE Final    Comment: (NOTE) Fact Sheet for Patients: EntrepreneurPulse.com.au  Fact Sheet for Healthcare Providers: IncredibleEmployment.be  This test is not yet approved or cleared by the [place] FDA and has been  authorized for detection and/or diagnosis of SARS-CoV-2 by FDA under an Emergency Use Authorization (EUA). This EUA will remain in effect (meaning this test can be used) for the duration of the COVID-19 declaration under Section 564(b)(1) of the Act, 21 U.S.C. section 360bbb-3(b)(1), unless the authorization is terminated or revoked.  Performed at [facility], [address] [place], [place] [address]   Blood Culture (routine x 2)     Status: None (Preliminary result)   Collection Time: 09/26/22 11:17 AM   Specimen: BLOOD RIGHT ARM  Result Value Ref Range Status   Specimen Description BLOOD RIGHT ARM  Final   Special Requests   Final    BOTTLES DRAWN AEROBIC AND ANAEROBIC Blood Culture results may not be optimal due to an excessive volume of blood received in culture bottles   Culture   Final    NO GROWTH 4 DAYS Performed at [facility], [address] [place], [place] [address]    Report Status PENDING  Incomplete  Blood Culture (routine x 2)     Status: None (Preliminary result)   Collection Time: 09/26/22 11:17 AM   Specimen: BLOOD LEFT ARM  Result Value Ref Range Status   Specimen Description BLOOD LEFT ARM  Final   Special Requests   Final  BOTTLES DRAWN AEROBIC AND ANAEROBIC Blood Culture results may not be optimal due to an excessive volume of blood received in culture bottles   Culture   Final    NO GROWTH 4 DAYS Performed at [facility], [address] [place], [place] [address]    Report Status PENDING  Incomplete  MRSA Next Gen by PCR, Nasal     Status: None   Collection Time: 09/27/22 12:08 PM   Specimen: Nasal Mucosa; Nasal Swab  Result Value Ref Range Status   MRSA by PCR Next Gen NOT DETECTED NOT DETECTED Final    Comment: (NOTE) The GeneXpert MRSA Assay (FDA approved for NASAL specimens only), is one component of a comprehensive MRSA colonization surveillance program. It is not intended to diagnose MRSA infection nor to  guide or monitor treatment for MRSA infections. Test performance is not FDA approved in patients less than 28 years old. Performed at [facility], [address], [place], [place] [address]       Radiology Studies last 3 days: EEG adult  Result Date: 09/29/2022 Lora Havens, MD     09/29/2022  7:43 PM Patient Name: Evan [name] MRN: [id number] Epilepsy Attending: Lora Havens Referring Physician/Provider: Amie Portland, MD Date: 09/29/2022 Duration: 24.48 mins Patient history: 67yo M with ams. EEG to evaluate for seizure Level of alertness: Awake AEDs during EEG study: None Technical aspects: This EEG study was done with scalp electrodes positioned according to the 10-20 International system of electrode placement. Electrical activity was reviewed with band pass filter of 1-'70Hz'$ , sensitivity of 7 uV/mm, display speed of 20m/sec with a '60Hz'$  notched filter applied as appropriate. EEG data were recorded continuously and digitally stored.  Video monitoring was available and reviewed as appropriate. Description: No clear posterior dominant rhythm was seen. EEG showed continuous generalized predominantly 5-'7Hz'$  Hz theta slowing admixed with intermittent generalized rhythmic 2-'3Hz'$  delta slowing. Hyperventilation and photic stimulation were not performed.   ABNORMALITY - Continuous slow, generalized IMPRESSION: This study is suggestive of moderate diffuse encephalopathy, nonspecific etiology. No seizures or epileptiform discharges were seen throughout the recording. PLora Havens  ECHOCARDIOGRAM COMPLETE  Result Date: 09/27/2022    ECHOCARDIOGRAM REPORT   Patient Name:   Evan LICONDate of Exam: 09/27/2022 Medical Rec #:  [id number]       Height:       65.0 in Accession #:    [id number]      Weight:       186.7 lb Date of Birth:  [birth year]        BSA:          1.921 m Patient Age:    89years         BP:           82/52 mmHg Patient Gender: M                HR:           67  bpm. Exam Location:  [facility] Procedure: 2D Echo, Color Doppler and Cardiac Doppler Indications:     Murmur R01.1  History:         Patient has prior history of Echocardiogram examinations, most                  recent 11/07/2020. CAD and Previous Myocardial Infarction, COPD;                  Risk Factors:Diabetes.  Sonographer:     Sherrie Sport Referring Phys:  [id number] Emeterio Reeve Diagnosing Phys: Kate Sable MD IMPRESSIONS  1. Left ventricular ejection fraction, by estimation, is 50 to 55%. The left ventricle has low normal function. The left ventricle has no regional wall motion abnormalities. There is mild left ventricular hypertrophy. Left ventricular diastolic parameters are indeterminate. There is moderate hypokinesis of the left ventricular, entire inferolateral wall.  2. Right ventricular systolic function is normal. The right ventricular size is normal.  3. Left atrial size was moderately dilated.  4. The mitral valve is normal in structure. Mild mitral valve regurgitation.  5. Mean aortic valve gradient 2mHg, PG 342mg, Vmax 9m39m DVI=0.29. The aortic valve is calcified. Aortic valve regurgitation is mild. Moderate aortic valve stenosis. FINDINGS  Left Ventricle: Left ventricular ejection fraction, by estimation, is 50 to 55%. The left ventricle has low normal function. The left ventricle has no regional wall motion abnormalities. Moderate hypokinesis of the left ventricular, entire inferolateral  wall. The left ventricular internal cavity size was normal in size. There is mild left ventricular hypertrophy. Left ventricular diastolic parameters are indeterminate. Right Ventricle: The right ventricular size is normal. No increase in right ventricular wall thickness. Right ventricular systolic function is normal. Left Atrium: Left atrial size was moderately dilated. Right Atrium: Right atrial size was normal in size. Pericardium: There is no evidence of pericardial effusion. Mitral Valve: The mitral  valve is normal in structure. Mild mitral valve regurgitation. Tricuspid Valve: The tricuspid valve is not well visualized. Tricuspid valve regurgitation is not demonstrated. Aortic Valve: Mean aortic valve gradient 149m79m PG 36mm10mVmax 9m/s,68mI=0.29. The aortic valve is calcified. Aortic valve regurgitation is mild. Aortic regurgitation PHT measures 581 msec. Moderate aortic stenosis is present. Aortic valve mean gradient measures 17.7 mmHg. Aortic valve peak gradient measures 31.8 mmHg. Aortic valve area, by VTI measures 1.04 cm. Pulmonic Valve: The pulmonic valve was not well visualized. Pulmonic valve regurgitation is not visualized. Aorta: The aortic root is normal in size and structure. Venous: The inferior vena cava was not well visualized. IAS/Shunts: No atrial level shunt detected by color flow Doppler.  LEFT VENTRICLE PLAX 2D LVIDd:         4.10 cm LVIDs:         2.90 cm LV PW:         1.10 cm LV IVS:        1.50 cm LVOT diam:     2.00 cm LV SV:         55 LV SV Index:   28 LVOT Area:     3.14 cm  RIGHT VENTRICLE RV Basal diam:  3.10 cm RV Mid diam:    2.10 cm RV S prime:     11.40 cm/s TAPSE (M-mode): 1.8 cm LEFT ATRIUM             Index        RIGHT ATRIUM           Index LA diam:        4.70 cm 2.45 cm/m   RA Area:     16.30 cm LA Vol (A2C):   81.3 ml 42.32 ml/m  RA Volume:   40.30 ml  20.98 ml/m LA Vol (A4C):   88.5 ml 46.06 ml/m LA Biplane Vol: 85.7 ml 44.61 ml/m  AORTIC VALVE AV Area (Vmax):    0.86 cm AV Area (Vmean):   0.88 cm AV Area (VTI):     1.04 cm  AV Vmax:           282.00 cm/s AV Vmean:          194.000 cm/s AV VTI:            0.527 m AV Peak Grad:      31.8 mmHg AV Mean Grad:      17.7 mmHg LVOT Vmax:         77.40 cm/s LVOT Vmean:        54.200 cm/s LVOT VTI:          0.174 m LVOT/AV VTI ratio: 0.33 AI PHT:            581 msec  AORTA Ao Root diam: 2.70 cm MITRAL VALVE               TRICUSPID VALVE MV Area (PHT): 5.88 cm    TR Peak grad:   17.6 mmHg MV Decel Time: 129 msec     TR Vmax:        210.00 cm/s MV E velocity: 98.00 cm/s                            SHUNTS                            Systemic VTI:  0.17 m                            Systemic Diam: 2.00 cm Kate Sable MD Electronically signed by Kate Sable MD Signature Date/Time: 09/27/2022/6:52:49 PM    Final    DG Chest Port 1 View  Result Date: 09/27/2022 CLINICAL DATA:  Shortness of breath, history COPD, hypertension, CHF, diabetes mellitus EXAM: PORTABLE CHEST 1 VIEW COMPARISON:  Portable exam 1211 hours compared to 09/26/2022 FINDINGS: Borderline enlargement of cardiac silhouette post CABG. Mediastinal contours and pulmonary vascularity normal. Eventration RIGHT diaphragm unchanged. No definite infiltrate, pleural effusion, or pneumothorax. Osseous structures unremarkable. IMPRESSION: No acute abnormalities. Electronically Signed   By: Lavonia Dana M.D.   On: 09/27/2022 13:39   CT HEAD WO CONTRAST (5MM)  Result Date: 09/26/2022 CLINICAL DATA:  Altered mental status EXAM: CT HEAD WITHOUT CONTRAST TECHNIQUE: Contiguous axial images were obtained from the base of the skull through the vertex without intravenous contrast. RADIATION DOSE REDUCTION: This exam was performed according to the departmental dose-optimization program which includes automated exposure control, adjustment of the mA and/or kV according to patient size and/or use of iterative reconstruction technique. COMPARISON:  None Available. FINDINGS: Brain: There is no mass, hemorrhage or extra-axial collection. The size and configuration of the ventricles and extra-axial CSF spaces are normal. There is hypoattenuation of the white matter, most commonly indicating chronic small vessel disease. Old small vessel infarct of the right thalamus. Vascular: Atherosclerotic calcification of the internal carotid arteries at the skull base. No abnormal hyperdensity of the major intracranial arteries or dural venous sinuses. Skull: The visualized skull base,  calvarium and extracranial soft tissues are normal. Sinuses/Orbits: No fluid levels or advanced mucosal thickening of the visualized paranasal sinuses. No mastoid or middle ear effusion. The orbits are normal. IMPRESSION: 1. No acute intracranial abnormality. 2. Chronic small vessel disease and old small vessel infarct of the right thalamus. Electronically Signed   By: Ulyses Jarred M.D.   On: 09/26/2022 20:54   CT ABDOMEN PELVIS W CONTRAST  Result  Date: 09/26/2022 CLINICAL DATA:  Dizziness, weakness, diarrhea, found down EXAM: CT ABDOMEN AND PELVIS WITH CONTRAST TECHNIQUE: Multidetector CT imaging of the abdomen and pelvis was performed using the standard protocol following bolus administration of intravenous contrast. RADIATION DOSE REDUCTION: This exam was performed according to the departmental dose-optimization program which includes automated exposure control, adjustment of the mA and/or kV according to patient size and/or use of iterative reconstruction technique. CONTRAST:  122m OMNIPAQUE IOHEXOL 300 MG/ML  SOLN COMPARISON:  08/31/2021 FINDINGS: Lower chest: No acute pleural or parenchymal lung disease. Hepatobiliary: Hepatic steatosis. No focal liver abnormality. Cholelithiasis without cholecystitis. Pancreas: Unremarkable. No pancreatic ductal dilatation or surrounding inflammatory changes. Spleen: Normal in size without focal abnormality. Adrenals/Urinary Tract: Stable appearance of the bilateral kidneys. No urinary tract calculi or obstructive uropathy. Bladder is moderately distended without filling defect or focal abnormality. The adrenals are stable. Stomach/Bowel: No bowel obstruction or ileus. No bowel wall thickening or inflammatory change. Vascular/Lymphatic: Aortic atherosclerosis. No enlarged abdominal or pelvic lymph nodes. Reproductive: Prostate is unremarkable. Other: No free fluid or free intraperitoneal gas. Fat containing umbilical hernia unchanged. No bowel herniation.  Musculoskeletal: No acute or destructive bony lesions. Reconstructed images demonstrate no additional findings. IMPRESSION: 1. Cholelithiasis without evidence of acute cholecystitis. 2. Fat containing umbilical hernia. 3. Otherwise stable appearance of the abdomen and pelvis without acute finding. 4.  Aortic Atherosclerosis (ICD10-I70.0). Electronically Signed   By: MRanda NgoM.D.   On: 09/26/2022 15:05             LOS: 4 days      NEmeterio Reeve DO [facility] 09/30/2022, 2:20 PM    Dictation software may have been used to generate the above note. Typos may occur and escape review in typed/dictated notes. Please contact Dr ASheppard Coildirectly for clarity if needed.  Staff may message me via secure chat in [place] but this may not receive an immediate response,  please page me for urgent matters!  If 7PM-7AM, please contact night coverage [email or web address]

## 2022-09-30 NOTE — Consult Note (Signed)
[facility] Face-to-Face Psychiatry Consult   Reason for Consult:  Hx of paranoid schizophrenia, medication adjustment r/t admit symptoms  Referring Physician:  Emeterio Reeve, DO Patient Identification: Evan [name] MRN:  [id number] Principal Diagnosis: SIRS (systemic inflammatory response syndrome) ([place]) Diagnosis:  Principal Problem:   SIRS (systemic inflammatory response syndrome) ([place]) Active Problems:   Undifferentiated schizophrenia ([place])   Hypertension associated with diabetes ([place])   Esophageal dysmotility   Chronic diastolic CHF (congestive heart failure) ([place])   Paranoid schizophrenia ([place])   Type 2 diabetes mellitus with morbid obesity ([place])   Obstructive sleep apnea on CPAP   Hyperlipidemia associated with type 2 diabetes mellitus ([place])   Persistent atrial fibrillation (HCC)   CAD (coronary artery disease)   Hoarding disorder   COPD, mild (HCC)   Atherosclerosis of aorta (HCC)   History of CVA (cerebrovascular accident)   Leukocytosis   AKI (acute kidney injury) ([place])   Shock circulatory (HCC)   Elevated lactic acid level   Left ventricular hypokinesis inferolateral wall   Moderate aortic stenosis   Total Time spent with patient: 45 minutes  Subjective:   Evan [name] is a 67 y.o. male patient admitted with SIRS.  HPI:  Evan [name] is a 67 yo male with a history of paranoid schizophrenia admitted for SIRS. Upon assessment he is sitting up in hospital bed, eating. He is cooperative with interview and engages appropriately with assessment questions with good eye contact. He was informed of medication change (d/c haldol) per neuro recommendation r/t concerns for medication contributing to feelings of dizziness and lower extremity weakness. He consented to change. He currently denies SI/HI, denies AH/VH. He endorses "having a lot of depression", rated 7/10 on a scale of 0-10 with 10 being most severe. He attributes depression to "not being able to do anything" (decreased  ADL ability). He endorses anxiety level 7/10, rated on scale 0-10 with 10 being most severe. He denies having anxiety attacks. He endorses sleep is "not good" but attributes this to being in the hospital, otherwise reporting no problems with sleep. Hearty appetite.  No psychosis or substance use.  He denies personal goals for treatment plan and agrees with current plan for care.    Past Psychiatric History: Paranoid Schizophrenia   Risk to Self:  none Risk to Others:  none Prior Inpatient Therapy:  several Prior Outpatient Therapy:  available at his SNF  Past Medical History:  Past Medical History:  Diagnosis Date   Anginal pain ([place])    Anxiety disorder    Asthma    Atresia of esophagus without fistula    CAD (coronary artery disease)    Cellulitis    CHF (congestive heart failure) (HCC)    NYHA CLASS III,CHRONIC,DIASTOLIC   COPD (chronic obstructive pulmonary disease) ([place])    COVID-19    Diabetes mellitus without complication (HCC)    Edema    RIGHT LOWER LEG   Gastroesophageal reflux    H/O: GI bleed    History of pneumonia    Remote   History of scarlet fever    Childhood   Hyperlipidemia    Hypertension    Myocardial infarction ([place]) 2009   Obesity    Obstructive sleep apnea    Pain    CHRONIC BACK / ABDOMINAL   Panic disorder    Peripheral venous insufficiency    PTSD (post-traumatic stress disorder)    Retinopathy    DIABETIC   Stasis, venous    Stroke (HCC)    Vertigo  Past Surgical History:  Procedure Laterality Date   CARDIAC CATHETERIZATION     CATARACT EXTRACTION Left    CATARACT EXTRACTION W/PHACO Right 05/03/2017   Procedure: CATARACT EXTRACTION PHACO AND INTRAOCULAR LENS PLACEMENT (IOC);  Surgeon: Leandrew Koyanagi, MD;  Location: [facility] ORS;  Service: Ophthalmology;  Laterality: Right;  [place] 00:35.3 AP% 12.3 CDE 4.33 Fluid Pack lot # [id number]        COLONOSCOPY WITH PROPOFOL N/A 11/26/2020   Procedure: COLONOSCOPY WITH PROPOFOL;  Surgeon:  Jonathon Bellows, MD;  Location: [facility] ENDOSCOPY;  Service: Gastroenterology;  Laterality: N/A;  ANNETTE TO PICK UP [phone]   CORONARY ANGIOPLASTY WITH STENT PLACEMENT  2002   CORONARY ANGIOPLASTY WITH STENT PLACEMENT  1999   CORONARY ARTERY BYPASS GRAFT     x7   ESOPHAGOGASTRODUODENOSCOPY N/A 09/19/2016   Procedure: ESOPHAGOGASTRODUODENOSCOPY (EGD);  Surgeon: Lollie Sails, MD;  Location: [facility] ENDOSCOPY;  Service: Endoscopy;  Laterality: N/A;   ESOPHAGOGASTRODUODENOSCOPY (EGD) WITH PROPOFOL  11/26/2020   Procedure: ESOPHAGOGASTRODUODENOSCOPY (EGD) WITH PROPOFOL;  Surgeon: Jonathon Bellows, MD;  Location: [facility] ENDOSCOPY;  Service: Gastroenterology;;   Family History:  Family History  Problem Relation Age of Onset   Heart attack Mother    Hypertension Mother    Hyperlipidemia Mother    Heart attack Brother 71       MI   Coronary artery disease Other    Family Psychiatric  History: none Social History:  Social History   Substance and Sexual Activity  Alcohol Use No     Social History   Substance and Sexual Activity  Drug Use No    Social History   Socioeconomic History   Marital status: Single    Spouse name: Not on file   Number of children: Not on file   Years of education: Not on file   Highest education level: Not on file  Occupational History    Comment: disabled  Tobacco Use   Smoking status: Never   Smokeless tobacco: Never  Vaping Use   Vaping Use: Never used  Substance and Sexual Activity   Alcohol use: No   Drug use: No   Sexual activity: Not Currently  Other Topics Concern   Not on file  Social History Narrative   Disabled   Single   Social Determinants of Health   Financial Resource Strain: Low Risk  (08/22/2021)   Overall Financial Resource Strain (CARDIA)    Difficulty of Paying Living Expenses: Not hard at all  Food Insecurity: No Food Insecurity (09/26/2022)   Hunger Vital Sign    Worried About Running Out of Food in the Last Year: Never true     Ran Out of Food in the Last Year: Never true  Transportation Needs: No Transportation Needs (09/26/2022)   PRAPARE - Hydrologist (Medical): No    Lack of Transportation (Non-Medical): No  Physical Activity: Sufficiently Active (02/25/2021)   Exercise Vital Sign    Days of Exercise per Week: 7 days    Minutes of Exercise per Session: 30 min  Stress: No Stress Concern Present (04/14/2021)   [place]    Feeling of Stress : Not at all  Social Connections: Moderately Integrated (05/03/2021)   Social Connection and Isolation Panel [NHANES]    Frequency of Communication with Friends and Family: More than three times a week    Frequency of Social Gatherings with Friends and Family: More than three times a week  Attends Religious Services: More than 4 times per year    Active Member of Clubs or Organizations: Yes    Attends Archivist Meetings: More than 4 times per year    Marital Status: Never married   Additional Social History:    Allergies:   Allergies  Allergen Reactions   Penicillin G Hives   Sulfa Antibiotics Hives   Tiotropium    Zoloft [Sertraline Hcl] Other (See Comments)    Labs:  Results for orders placed or performed during the hospital encounter of 09/26/22 (from the past 48 hour(s))  Glucose, capillary     Status: Abnormal   Collection Time: 09/28/22 12:01 PM  Result Value Ref Range   Glucose-Capillary 165 (H) 70 - 99 mg/dL    Comment: Glucose reference range applies only to samples taken after fasting for at least 8 hours.  Glucose, capillary     Status: Abnormal   Collection Time: 09/28/22  4:32 PM  Result Value Ref Range   Glucose-Capillary 184 (H) 70 - 99 mg/dL    Comment: Glucose reference range applies only to samples taken after fasting for at least 8 hours.  Glucose, capillary     Status: Abnormal   Collection Time: 09/28/22  9:04 PM  Result Value Ref  Range   Glucose-Capillary 137 (H) 70 - 99 mg/dL    Comment: Glucose reference range applies only to samples taken after fasting for at least 8 hours.  Magnesium     Status: None   Collection Time: 09/29/22  5:46 AM  Result Value Ref Range   Magnesium 2.2 1.7 - 2.4 mg/dL    Comment: Performed at [facility], [address] [place], [place] [address]  Phosphorus     Status: None   Collection Time: 09/29/22  5:46 AM  Result Value Ref Range   Phosphorus 2.6 2.5 - 4.6 mg/dL    Comment: Performed at [facility], [address] [place], [place] [address]  CBC with Differential/Platelet     Status: Abnormal   Collection Time: 09/29/22  5:46 AM  Result Value Ref Range   WBC 13.2 (H) 4.0 - 10.5 K/uL   RBC 3.82 (L) 4.22 - 5.81 MIL/uL   Hemoglobin 10.8 (L) 13.0 - 17.0 g/dL   HCT 33.7 (L) 39.0 - 52.0 %   MCV 88.2 80.0 - 100.0 fL   MCH 28.3 26.0 - 34.0 pg   MCHC 32.0 30.0 - 36.0 g/dL   RDW 14.7 11.5 - 15.5 %   Platelets 378 150 - 400 K/uL   nRBC 0.0 0.0 - 0.2 %   Neutrophils Relative % 90 %   Neutro Abs 11.8 (H) 1.7 - 7.7 K/uL   Lymphocytes Relative 6 %   Lymphs Abs 0.8 0.7 - 4.0 K/uL   Monocytes Relative 3 %   Monocytes Absolute 0.4 0.1 - 1.0 K/uL   Eosinophils Relative 0 %   Eosinophils Absolute 0.0 0.0 - 0.5 K/uL   Basophils Relative 0 %   Basophils Absolute 0.0 0.0 - 0.1 K/uL   Immature Granulocytes 1 %   Abs Immature Granulocytes 0.09 (H) 0.00 - 0.07 K/uL    Comment: Performed at [facility], [address], [place],  [id number]  Basic metabolic panel     Status: Abnormal   Collection Time: 09/29/22  5:46 AM  Result Value Ref Range   Sodium 135 135 - 145 mmol/L   Potassium 3.6 3.5 - 5.1 mmol/L   Chloride 106 98 -  111 mmol/L   CO2 22 22 - 32 mmol/L   Glucose, Bld 159 (H) 70 - 99 mg/dL    Comment: Glucose reference range applies only to samples taken after fasting for at least 8 hours.   BUN 21 8 - 23 mg/dL   Creatinine, Ser 0.72 0.61 -  1.24 mg/dL   Calcium 8.3 (L) 8.9 - 10.3 mg/dL   GFR, Estimated >60 >60 mL/min    Comment: (NOTE) Calculated using the CKD-EPI Creatinine Equation (2021)    Anion gap 7 5 - 15    Comment: Performed at [facility], [address] [place], [place] [address]  Glucose, capillary     Status: Abnormal   Collection Time: 09/29/22  7:43 AM  Result Value Ref Range   Glucose-Capillary 152 (H) 70 - 99 mg/dL    Comment: Glucose reference range applies only to samples taken after fasting for at least 8 hours.  Glucose, capillary     Status: Abnormal   Collection Time: 09/29/22 11:26 AM  Result Value Ref Range   Glucose-Capillary 182 (H) 70 - 99 mg/dL    Comment: Glucose reference range applies only to samples taken after fasting for at least 8 hours.  Glucose, capillary     Status: Abnormal   Collection Time: 09/29/22  4:52 PM  Result Value Ref Range   Glucose-Capillary 172 (H) 70 - 99 mg/dL    Comment: Glucose reference range applies only to samples taken after fasting for at least 8 hours.  Glucose, capillary     Status: Abnormal   Collection Time: 09/29/22  9:03 PM  Result Value Ref Range   Glucose-Capillary 146 (H) 70 - 99 mg/dL    Comment: Glucose reference range applies only to samples taken after fasting for at least 8 hours.  Vitamin B12     Status: None   Collection Time: 09/30/22  4:45 AM  Result Value Ref Range   Vitamin B-12 501 180 - 914 pg/mL    Comment: (NOTE) This assay is not validated for testing neonatal or myeloproliferative syndrome specimens for Vitamin B12 levels. Performed at [facility], [place] [address] [place], [place] [address]   TSH     Status: None   Collection Time: 09/30/22  4:45 AM  Result Value Ref Range   TSH 2.036 0.350 - 4.500 uIU/mL    Comment: Performed by a 3rd Generation assay with a functional sensitivity of <=0.01 uIU/mL. Performed at [facility], [address] [place], [place] [address]   Ammonia     Status:  None   Collection Time: 09/30/22  4:45 AM  Result Value Ref Range   Ammonia 15 9 - 35 umol/L    Comment: Performed at [facility], [address] [place], [place] [address]  CBC     Status: Abnormal   Collection Time: 09/30/22  4:45 AM  Result Value Ref Range   WBC 11.8 (H) 4.0 - 10.5 K/uL   RBC 4.03 (L) 4.22 - 5.81 MIL/uL   Hemoglobin 11.6 (L) 13.0 - 17.0 g/dL   HCT 36.0 (L) 39.0 - 52.0 %   MCV 89.3 80.0 - 100.0 fL   MCH 28.8 26.0 - 34.0 pg   MCHC 32.2 30.0 - 36.0 g/dL   RDW 14.7 11.5 - 15.5 %   Platelets 391 150 - 400 K/uL   nRBC 0.0 0.0 - 0.2 %    Comment: Performed at [facility], [place], [place]  [id number]  Basic metabolic panel     Status: Abnormal   Collection Time: 09/30/22  4:45 AM  Result Value Ref Range   Sodium 135 135 - 145 mmol/L   Potassium 3.4 (L) 3.5 - 5.1 mmol/L   Chloride 106 98 - 111 mmol/L   CO2 22 22 - 32 mmol/L   Glucose, Bld 113 (H) 70 - 99 mg/dL    Comment: Glucose reference range applies only to samples taken after fasting for at least 8 hours.   BUN 22 8 - 23 mg/dL   Creatinine, Ser 0.72 0.61 - 1.24 mg/dL   Calcium 8.5 (L) 8.9 - 10.3 mg/dL   GFR, Estimated >60 >60 mL/min    Comment: (NOTE) Calculated using the CKD-EPI Creatinine Equation (2021)    Anion gap 7 5 - 15    Comment: Performed at [facility], [address] [place], [place] [address]  Glucose, capillary     Status: Abnormal   Collection Time: 09/30/22  8:25 AM  Result Value Ref Range   Glucose-Capillary 115 (H) 70 - 99 mg/dL    Comment: Glucose reference range applies only to samples taken after fasting for at least 8 hours.    Current Facility-Administered Medications  Medication Dose Route Frequency Provider Last Rate Last Admin   0.9 %  sodium chloride infusion  250 mL Intravenous Continuous Emeterio Reeve, DO   Held at 09/27/22 1211   acetaminophen (TYLENOL) tablet 650 mg  650 mg Oral Q6H PRN Cox, Amy N, DO   650 mg at  09/30/22 [id number]   Or   acetaminophen (TYLENOL) suppository 650 mg  650 mg Rectal Q6H PRN Cox, Amy N, DO       albuterol (PROVENTIL) (2.5 MG/3ML) 0.083% nebulizer solution 2.5 mg  2.5 mg Nebulization Q6H PRN Cox, Amy N, DO       aspirin EC tablet 81 mg  81 mg Oral Daily Cox, Amy N, DO   81 mg at 09/30/22 0942   cefTRIAXone (ROCEPHIN) 2 g in sodium chloride 0.9 % 100 mL IVPB  2 g Intravenous Q24H Benita Gutter, RPH 200 mL/hr at 09/30/22 0956 2 g at 09/30/22 0956   Chlorhexidine Gluconate Cloth 2 % PADS 6 each  6 each Topical Daily Emeterio Reeve, DO   6 each at 09/29/22 [id number]   feeding supplement (ENSURE ENLIVE / ENSURE PLUS) liquid 237 mL  237 mL Oral BID BM Emeterio Reeve, DO   237 mL at 09/30/22 0948   insulin aspart (novoLOG) injection 0-5 Units  0-5 Units Subcutaneous QHS Cox, Amy N, DO       insulin aspart (novoLOG) injection 0-9 Units  0-9 Units Subcutaneous TID WC Cox, Amy N, DO   2 Units at 09/29/22 1734   melatonin tablet 5 mg  5 mg Oral QHS PRN Cox, Amy N, DO       menthol-cetylpyridinium (CEPACOL) lozenge 3 mg  1 lozenge Oral PRN Cox, Amy N, DO       midodrine (PROAMATINE) tablet 10 mg  10 mg Oral TID WC Teressa Lower, NP   10 mg at 09/30/22 0941   mometasone-formoterol (DULERA) 100-5 MCG/ACT inhaler 2 puff  2 puff Inhalation BID Cox, Amy N, DO   2 puff at 09/30/22 0943   multivitamin with minerals tablet 1 tablet  1 tablet Oral Daily Emeterio Reeve, DO   1 tablet at 09/30/22 0942   nitroGLYCERIN (NITROSTAT) SL tablet 0.4 mg  0.4 mg Sublingual Q5 min PRN  Cox, Amy N, DO       nystatin cream (MYCOSTATIN)   Topical BID PRN Cox, Amy N, DO       ondansetron (ZOFRAN) tablet 4 mg  4 mg Oral Q6H PRN Cox, Amy N, DO       Or   ondansetron (ZOFRAN) injection 4 mg  4 mg Intravenous Q6H PRN Cox, Amy N, DO       pantoprazole (PROTONIX) EC tablet 40 mg  40 mg Oral QAC breakfast Cox, Amy N, DO   40 mg at 09/30/22 [id number]   risperiDONE (RISPERDAL) tablet 1 mg  1 mg Oral BID Patrecia Pour,  NP   1 mg at 09/30/22 [id number]   rivaroxaban (XARELTO) tablet 20 mg  20 mg Oral Q supper Cox, Amy N, DO   20 mg at 09/29/22 1733   senna (SENOKOT) tablet 17.2 mg  2 tablet Oral QHS Cox, Amy N, DO   17.2 mg at 09/29/22 2149   senna-docusate (Senokot-S) tablet 1 tablet  1 tablet Oral QHS PRN Cox, Amy N, DO       simvastatin (ZOCOR) tablet 20 mg  20 mg Oral QHS Cox, Amy N, DO   20 mg at 09/29/22 2149    Musculoskeletal: Strength & Muscle Tone: decreased Gait & Station:  did not witness Patient leans: N/A  Psychiatric Specialty Exam: Physical Exam Vitals and nursing note reviewed.  Constitutional:      Appearance: Normal appearance.  HENT:     Head: Normocephalic.     Nose: Nose normal.  Pulmonary:     Effort: Pulmonary effort is normal.  Musculoskeletal:     Cervical back: Normal range of motion.  Neurological:     General: No focal deficit present.     Mental Status: He is alert and oriented to person, place, and time.  Psychiatric:        Attention and Perception: Attention and perception normal.        Mood and Affect: Mood is anxious and depressed.        Speech: Speech normal.        Behavior: Behavior normal. Behavior is cooperative.        Thought Content: Thought content normal.        Cognition and Memory: Cognition and memory normal.        Judgment: Judgment normal.     Review of Systems  Psychiatric/Behavioral:  Positive for depression. The patient is nervous/anxious.   All other systems reviewed and are negative.   Blood pressure 102/60, pulse 74, temperature 98.2 F (36.8 C), resp. rate 18, height '5\' 5"'$  (1.651 m), weight 92.1 kg, SpO2 100 %.Body mass index is 33.79 kg/m.  General Appearance: Disheveled  Eye Contact:  Fair  Speech:  Slow  Volume:  Decreased  Mood:  Anxious and Depressed  Affect:  Congruent  Thought Process:  Coherent  Orientation:  Full (Time, Place, and Person)  Thought Content:  Logical  Suicidal Thoughts:  No  Homicidal Thoughts:  No   Memory:  Immediate;   Good Recent;   Good  Judgement:  Fair  Insight:  Fair  Psychomotor Activity:  Tremor  Concentration:  Concentration: Good and Attention Span: Good  Recall:  [employer] of Knowledge:  Fair  Language:  Good  Akathisia:  No  Handed:  Right  AIMS (if indicated):     Assets:  Desire for Improvement Physical Health Resilience  ADL's:  Impaired  Cognition:  Impaired,  Mild  Sleep:   Impaired in hospital, otherwise reports it is adequate      Physical Exam: Physical Exam Vitals and nursing note reviewed.  Constitutional:      Appearance: Normal appearance.  HENT:     Head: Normocephalic.     Nose: Nose normal.  Pulmonary:     Effort: Pulmonary effort is normal.  Musculoskeletal:     Cervical back: Normal range of motion.  Neurological:     General: No focal deficit present.     Mental Status: He is alert and oriented to person, place, and time.  Psychiatric:        Attention and Perception: Attention and perception normal.        Mood and Affect: Mood is anxious and depressed.        Speech: Speech normal.        Behavior: Behavior normal. Behavior is cooperative.        Thought Content: Thought content normal.        Cognition and Memory: Cognition and memory normal.        Judgment: Judgment normal.    Review of Systems  Psychiatric/Behavioral:  Positive for depression. The patient is nervous/anxious.   All other systems reviewed and are negative.  Blood pressure 102/60, pulse 74, temperature 98.2 F (36.8 C), resp. rate 18, height '5\' 5"'$  (1.651 m), weight 92.1 kg, SpO2 100 %. Body mass index is 33.79 kg/m.  Treatment Plan Summary: Schizophrenia, undifferentiated: Continue Risperdal 1 mg BID, Haldol discontinued yesterday  Disposition: No evidence of imminent risk to self or others at present.   Patient does not meet criteria for psychiatric inpatient admission. Supportive therapy provided about ongoing stressors.  Waylan Boga,  NP 09/30/2022 10:23 AM

## 2022-09-30 NOTE — Plan of Care (Signed)
Ammonia, TSH - WNL EEG with no focality or seizures. MRI brain and B12 levels pending.  Psychiatry to evaluate - already made some changes to antipsychotics. Appreciate input on meds. Will need outpatient neurological follow up [facility] or [facility] practices - whatever he prefers). Will follow on MRI.  -- Amie [place], MD Neurologist [facility] Pager: [phone]

## 2022-09-30 NOTE — Plan of Care (Signed)
  Problem: Clinical Measurements: Goal: Diagnostic test results will improve Outcome: Progressing Goal: Signs and symptoms of infection will decrease Outcome: Progressing   Problem: Education: Goal: Knowledge of General Education information will improve Description: Including pain rating scale, medication(s)/side effects and non-pharmacologic comfort measures Outcome: Progressing

## 2022-09-30 NOTE — Evaluation (Signed)
Clinical/Bedside Swallow Evaluation Patient Details  Name: Evan [name] MRN: [id number] Date of Birth: May 04, 1956  Today's Date: 09/30/2022 Time: SLP Start Time (ACUTE ONLY): 1110 SLP Stop Time (ACUTE ONLY): 1121 SLP Time Calculation (min) (ACUTE ONLY): 11 min  Past Medical History:  Past Medical History:  Diagnosis Date   Anginal pain ([place])    Anxiety disorder    Asthma    Atresia of esophagus without fistula    CAD (coronary artery disease)    Cellulitis    CHF (congestive heart failure) (HCC)    NYHA CLASS III,CHRONIC,DIASTOLIC   COPD (chronic obstructive pulmonary disease) ([place])    COVID-19    Diabetes mellitus without complication (HCC)    Edema    RIGHT LOWER LEG   Gastroesophageal reflux    H/O: GI bleed    History of pneumonia    Remote   History of scarlet fever    Childhood   Hyperlipidemia    Hypertension    Myocardial infarction ([place]) 2009   Obesity    Obstructive sleep apnea    Pain    CHRONIC BACK / ABDOMINAL   Panic disorder    Peripheral venous insufficiency    PTSD (post-traumatic stress disorder)    Retinopathy    DIABETIC   Stasis, venous    Stroke ([place])    Vertigo    Past Surgical History:  Past Surgical History:  Procedure Laterality Date   CARDIAC CATHETERIZATION     CATARACT EXTRACTION Left    CATARACT EXTRACTION W/PHACO Right 05/03/2017   Procedure: CATARACT EXTRACTION PHACO AND INTRAOCULAR LENS PLACEMENT (IOC);  Surgeon: Leandrew Koyanagi, MD;  Location: [facility] ORS;  Service: Ophthalmology;  Laterality: Right;  [place] 00:35.3 AP% 12.3 CDE 4.33 Fluid Pack lot # [id number]        COLONOSCOPY WITH PROPOFOL N/A 11/26/2020   Procedure: COLONOSCOPY WITH PROPOFOL;  Surgeon: Jonathon Bellows, MD;  Location: [facility] ENDOSCOPY;  Service: Gastroenterology;  Laterality: N/A;  ANNETTE TO PICK UP [phone]   CORONARY ANGIOPLASTY WITH STENT PLACEMENT  2002   CORONARY ANGIOPLASTY WITH STENT PLACEMENT  1999   CORONARY ARTERY BYPASS GRAFT     x7    ESOPHAGOGASTRODUODENOSCOPY N/A 09/19/2016   Procedure: ESOPHAGOGASTRODUODENOSCOPY (EGD);  Surgeon: Lollie Sails, MD;  Location: [facility] ENDOSCOPY;  Service: Endoscopy;  Laterality: N/A;   ESOPHAGOGASTRODUODENOSCOPY (EGD) WITH PROPOFOL  11/26/2020   Procedure: ESOPHAGOGASTRODUODENOSCOPY (EGD) WITH PROPOFOL;  Surgeon: Jonathon Bellows, MD;  Location: [facility] ENDOSCOPY;  Service: Gastroenterology;;   HPI:  [name] is a 67 y.o. male significant medical history of paranoid schizophrenia on Risperdal and Haldol, atrial fibrillation on Xarelto, COPD, hyperlipidemia, hypertension, prior history of thalamic stroke with residual weakness symptoms which she says are bilateral, admitted for evaluation of hypotension, possible SIRS/sepsis unknown source and altered mental status that is improving but he continues to complain of tremors as well as numbness in bilateral lower extremities as well as weakness of the bilateral lower extremities since his mentation has improved.    Chest x-ray 09/27/2022    I do feel that he has some extrapyramidal symptoms and looking at his medication list, I can only think of Haldol and Risperdal being used together as contributing to it.    Assessment / Plan / Recommendation  Clinical Impression  Pt presents with adequate oropharyngeal abilities when consuming thin liquids via straw, applesauce and graham crackers. Given pt's edentulous status, recommend pt continue dysphagia 3 for ease of mastication. Pt agreeable. Currently prescribed diet continues to appear  appropriate. No further ST services are indicated at this time.  SLP Visit Diagnosis: Dysphagia, unspecified (R13.10)    Aspiration Risk  Mild aspiration risk    Diet Recommendation Dysphagia 3 (Mech soft);Thin liquid   Liquid Administration via: Cup;Straw Medication Administration: Whole meds with liquid Supervision: Patient able to self feed Compensations: Minimize environmental distractions;Slow rate;Small  sips/bites Postural Changes: Seated upright at 90 degrees;Remain upright for at least 30 minutes after po intake    Other  Recommendations Oral Care Recommendations: Oral care BID    Recommendations for follow up therapy are one component of a multi-disciplinary discharge planning process, led by the attending physician.  Recommendations may be updated based on patient status, additional functional criteria and insurance authorization.  Follow up Recommendations No SLP follow up         Functional Status Assessment Patient has not had a recent decline in their functional status   Swallow Study   General Date of Onset: 09/30/22 HPI: Evan [name] is a 67 y.o. male significant medical history of paranoid schizophrenia on Risperdal and Haldol, atrial fibrillation on Xarelto, COPD, hyperlipidemia, hypertension, prior history of thalamic stroke with residual weakness symptoms which she says are bilateral, admitted for evaluation of hypotension, possible SIRS/sepsis unknown source and altered mental status that is improving but he continues to complain of tremors as well as numbness in bilateral lower extremities as well as weakness of the bilateral lower extremities since his mentation has improved.    Chest x-ray 09/27/2022    I do feel that he has some extrapyramidal symptoms and looking at his medication list, I can only think of Haldol and Risperdal being used together as contributing to it. Type of Study: Bedside Swallow Evaluation Previous Swallow Assessment: none in chart Diet Prior to this Study: Dysphagia 3 (mechanical soft);Thin liquids (Level 0) Temperature Spikes Noted: No Respiratory Status: Room air History of Recent Intubation: No Behavior/Cognition: Alert;Cooperative;Pleasant mood Oral Cavity Assessment: Within Functional Limits Oral Care Completed by SLP: No Oral Cavity - Dentition: Edentulous Vision: Functional for self-feeding Self-Feeding Abilities: Able to feed  self Patient Positioning: Upright in bed Baseline Vocal Quality: Normal Volitional Cough: Cognitively unable to elicit Volitional Swallow: Unable to elicit    Oral/Motor/Sensory Function Overall Oral Motor/Sensory Function: Within functional limits   Ice Chips Ice chips: Not tested   Thin Liquid Thin Liquid: Within functional limits Presentation: Self Fed;Straw    Nectar Thick Nectar Thick Liquid: Not tested   Honey Thick Honey Thick Liquid: Not tested   Puree Puree: Within functional limits Presentation: Self Fed;Spoon   Solid     Solid: Within functional limits Presentation: Self Fed     Clemma Johnsen B. Rutherford Nail, M.S., CCC-SLP, Mining engineer Certified Brain Injury [place]  [place] Office [phone] Ascom [phone] Fax [phone]

## 2022-10-01 DIAGNOSIS — F200 Paranoid schizophrenia: Secondary | ICD-10-CM | POA: Diagnosis not present

## 2022-10-01 DIAGNOSIS — F203 Undifferentiated schizophrenia: Secondary | ICD-10-CM | POA: Diagnosis not present

## 2022-10-01 LAB — CULTURE, BLOOD (ROUTINE X 2)
Culture: NO GROWTH
Culture: NO GROWTH

## 2022-10-01 LAB — GLUCOSE, CAPILLARY
Glucose-Capillary: 114 mg/dL — ABNORMAL HIGH (ref 70–99)
Glucose-Capillary: 146 mg/dL — ABNORMAL HIGH (ref 70–99)
Glucose-Capillary: 148 mg/dL — ABNORMAL HIGH (ref 70–99)
Glucose-Capillary: 130 mg/dL — ABNORMAL HIGH (ref 70–99)
Glucose-Capillary: 113 mg/dL — ABNORMAL HIGH (ref 70–99)

## 2022-10-01 MED ORDER — MIDODRINE HCL 5 MG PO TABS
5.0000 mg | ORAL_TABLET | Freq: Three times a day (TID) | ORAL | Status: DC
  Administered 2022-10-01 – 2022-10-06 (×15): 5 mg via ORAL
  Filled 2022-10-01 (×16): qty 1

## 2022-10-01 MED ORDER — LORAZEPAM 0.5 MG PO TABS
0.5000 mg | ORAL_TABLET | Freq: Four times a day (QID) | ORAL | Status: DC | PRN
  Administered 2022-10-01: 0.5 mg via ORAL
  Filled 2022-10-01: qty 1

## 2022-10-01 NOTE — Plan of Care (Signed)
No acute findings on MRI. No other neurological recs inpatient at this time. Follow up with outpatient neurology in 8-12 weeks. -- Amie Portland, MD Neurologist [facility] Pager: [phone]

## 2022-10-01 NOTE — Plan of Care (Signed)

## 2022-10-01 NOTE — Consult Note (Signed)
[name] Face-to-Face Psychiatry Consult   Reason for Consult:  Hx of paranoid schizophrenia, medication adjustment r/t admit symptoms  Referring Physician:  Emeterio Reeve, DO Patient Identification: Evan [name] MRN:  [id number] Principal Diagnosis: SIRS (systemic inflammatory response syndrome) ([place]) Diagnosis:  Principal Problem:   SIRS (systemic inflammatory response syndrome) ([place]) Active Problems:   Undifferentiated schizophrenia ([place])   Hypertension associated with diabetes ([place])   Esophageal dysmotility   Chronic diastolic CHF (congestive heart failure) ([place])   Paranoid schizophrenia ([place])   Type 2 diabetes mellitus with morbid obesity ([place])   Obstructive sleep apnea on CPAP   Hyperlipidemia associated with type 2 diabetes mellitus ([place])   Persistent atrial fibrillation (HCC)   CAD (coronary artery disease)   Hoarding disorder   COPD, mild (HCC)   Atherosclerosis of aorta (HCC)   History of CVA (cerebrovascular accident)   Leukocytosis   AKI (acute kidney injury) ([place])   Shock circulatory (HCC)   Elevated lactic acid level   Left ventricular hypokinesis inferolateral wall   Moderate aortic stenosis   Total Time spent with patient: 45 minutes  Subjective:   Evan [name] is a 67 y.o. male patient admitted with SIRS.  Today, patient sitting in bed.  Upon assessment, patient reported Anxiety level 10/10 due to no being able to control his bowels, PRN Ativan ordered to assist.   Patient stated his appetite has been good, and that he has been in and out of sleep last night.  Reminded patient that he can request his nightly melatonin for sleep.  He reported a depression level of 7/10 due to decreased adl's.  Discussed medication changes with patient, d/c Haldol and Risperidol 1 mg BID.  Denies suicidal ideations/homicidal ideations and auditory and visual hallucinations.  HPI on 2/24:  Evan [name] is a 67 yo male with a history of paranoid schizophrenia admitted for SIRS.  Upon assessment he is sitting up in hospital bed, eating. He is cooperative with interview and engages appropriately with assessment questions with good eye contact. He was informed of medication change (d/c haldol) per neuro recommendation r/t concerns for medication contributing to feelings of dizziness and lower extremity weakness. He consented to change. He currently denies SI/HI, denies AH/VH. He endorses "having a lot of depression", rated 7/10 on a scale of 0-10 with 10 being most severe. He attributes depression to "not being able to do anything" (decreased ADL ability). He endorses anxiety level 7/10, rated on scale 0-10 with 10 being most severe. He denies having anxiety attacks. He endorses sleep is "not good" but attributes this to being in the hospital, otherwise reporting no problems with sleep. Hearty appetite.  No psychosis or substance use.  He denies personal goals for treatment plan and agrees with current plan for care.    Past Psychiatric History: Paranoid Schizophrenia   Risk to Self:  none Risk to Others:  none Prior Inpatient Therapy:  several Prior Outpatient Therapy:  available at his SNF  Past Medical History:  Past Medical History:  Diagnosis Date   Anginal pain ([place])    Anxiety disorder    Asthma    Atresia of esophagus without fistula    CAD (coronary artery disease)    Cellulitis    CHF (congestive heart failure) (HCC)    NYHA CLASS III,CHRONIC,DIASTOLIC   COPD (chronic obstructive pulmonary disease) ([place])    COVID-19    Diabetes mellitus without complication (HCC)    Edema    RIGHT LOWER LEG   Gastroesophageal reflux  H/O: GI bleed    History of pneumonia    Remote   History of scarlet fever    Childhood   Hyperlipidemia    Hypertension    Myocardial infarction ([place]) 2009   Obesity    Obstructive sleep apnea    Pain    CHRONIC BACK / ABDOMINAL   Panic disorder    Peripheral venous insufficiency    PTSD (post-traumatic stress disorder)     Retinopathy    DIABETIC   Stasis, venous    Stroke [facility])    Vertigo     Past Surgical History:  Procedure Laterality Date   CARDIAC CATHETERIZATION     CATARACT EXTRACTION Left    CATARACT EXTRACTION W/PHACO Right 05/03/2017   Procedure: CATARACT EXTRACTION PHACO AND INTRAOCULAR LENS PLACEMENT ([place]);  Surgeon: Leandrew Koyanagi, MD;  Location: [facility] ORS;  Service: Ophthalmology;  Laterality: Right;  [place] 00:35.3 AP% 12.3 CDE 4.33 Fluid Pack lot # [id number]        COLONOSCOPY WITH PROPOFOL N/A 11/26/2020   Procedure: COLONOSCOPY WITH PROPOFOL;  Surgeon: Jonathon Bellows, MD;  Location: [facility] ENDOSCOPY;  Service: Gastroenterology;  Laterality: N/A;  ANNETTE TO PICK UP [phone]   CORONARY ANGIOPLASTY WITH STENT PLACEMENT  2002   CORONARY ANGIOPLASTY WITH STENT PLACEMENT  1999   CORONARY ARTERY BYPASS GRAFT     x7   ESOPHAGOGASTRODUODENOSCOPY N/A 09/19/2016   Procedure: ESOPHAGOGASTRODUODENOSCOPY (EGD);  Surgeon: Lollie Sails, MD;  Location: [facility] ENDOSCOPY;  Service: Endoscopy;  Laterality: N/A;   ESOPHAGOGASTRODUODENOSCOPY (EGD) WITH PROPOFOL  11/26/2020   Procedure: ESOPHAGOGASTRODUODENOSCOPY (EGD) WITH PROPOFOL;  Surgeon: Jonathon Bellows, MD;  Location: [facility] ENDOSCOPY;  Service: Gastroenterology;;   Family History:  Family History  Problem Relation Age of Onset   Heart attack Mother    Hypertension Mother    Hyperlipidemia Mother    Heart attack Brother 17       MI   Coronary artery disease Other    Family Psychiatric  History: none Social History:  Social History   Substance and Sexual Activity  Alcohol Use No     Social History   Substance and Sexual Activity  Drug Use No    Social History   Socioeconomic History   Marital status: Single    Spouse name: Not on file   Number of children: Not on file   Years of education: Not on file   Highest education level: Not on file  Occupational History    Comment: disabled  Tobacco Use   Smoking status: Never    Smokeless tobacco: Never  Vaping Use   Vaping Use: Never used  Substance and Sexual Activity   Alcohol use: No   Drug use: No   Sexual activity: Not Currently  Other Topics Concern   Not on file  Social History Narrative   Disabled   Single   Social Determinants of Health   Financial Resource Strain: Low Risk  (08/22/2021)   Overall Financial Resource Strain (CARDIA)    Difficulty of Paying Living Expenses: Not hard at all  Food Insecurity: No Food Insecurity (09/26/2022)   Hunger Vital Sign    Worried About Running Out of Food in the Last Year: Never true    Ran Out of Food in the Last Year: Never true  Transportation Needs: No Transportation Needs (09/26/2022)   PRAPARE - Hydrologist (Medical): No    Lack of Transportation (Non-Medical): No  Physical Activity: Sufficiently Active (02/25/2021)   Exercise  Vital Sign    Days of Exercise per Week: 7 days    Minutes of Exercise per Session: 30 min  Stress: No Stress Concern Present (04/14/2021)   [place]    Feeling of Stress : Not at all  Social Connections: Moderately Integrated (05/03/2021)   Social Connection and Isolation Panel [NHANES]    Frequency of Communication with Friends and Family: More than three times a week    Frequency of Social Gatherings with Friends and Family: More than three times a week    Attends Religious Services: More than 4 times per year    Active Member of [employer] or Organizations: Yes    Attends Music therapist: More than 4 times per year    Marital Status: Never married   Additional Social History:    Allergies:   Allergies  Allergen Reactions   Penicillin G Hives   Sulfa Antibiotics Hives   Tiotropium    Zoloft [Sertraline Hcl] Other (See Comments)    Labs:  Results for orders placed or performed during the hospital encounter of 09/26/22 (from the past 48 hour(s))  Glucose,  capillary     Status: Abnormal   Collection Time: 09/29/22  4:52 PM  Result Value Ref Range   Glucose-Capillary 172 (H) 70 - 99 mg/dL    Comment: Glucose reference range applies only to samples taken after fasting for at least 8 hours.  Glucose, capillary     Status: Abnormal   Collection Time: 09/29/22  9:03 PM  Result Value Ref Range   Glucose-Capillary 146 (H) 70 - 99 mg/dL    Comment: Glucose reference range applies only to samples taken after fasting for at least 8 hours.  Vitamin B12     Status: None   Collection Time: 09/30/22  4:45 AM  Result Value Ref Range   Vitamin B-12 501 180 - 914 pg/mL    Comment: (NOTE) This assay is not validated for testing neonatal or myeloproliferative syndrome specimens for Vitamin B12 levels. Performed at [facility], [place] [address] [place], [place] [address]   TSH     Status: None   Collection Time: 09/30/22  4:45 AM  Result Value Ref Range   TSH 2.036 0.350 - 4.500 uIU/mL    Comment: Performed by a 3rd Generation assay with a functional sensitivity of <=0.01 uIU/mL. Performed at [facility], [address] [place], [place] [address]   Ammonia     Status: None   Collection Time: 09/30/22  4:45 AM  Result Value Ref Range   Ammonia 15 9 - 35 umol/L    Comment: Performed at [facility], [address] [place], [place] [address]  CBC     Status: Abnormal   Collection Time: 09/30/22  4:45 AM  Result Value Ref Range   WBC 11.8 (H) 4.0 - 10.5 K/uL   RBC 4.03 (L) 4.22 - 5.81 MIL/uL   Hemoglobin 11.6 (L) 13.0 - 17.0 g/dL   HCT 36.0 (L) 39.0 - 52.0 %   MCV 89.3 80.0 - 100.0 fL   MCH 28.8 26.0 - 34.0 pg   MCHC 32.2 30.0 - 36.0 g/dL   RDW 14.7 11.5 - 15.5 %   Platelets 391 150 - 400 K/uL   nRBC 0.0 0.0 - 0.2 %    Comment: Performed at [facility], [address], [place], [place] [id number]  Basic metabolic panel     Status:  Abnormal   Collection Time: 09/30/22  4:45 AM  Result Value Ref Range    Sodium 135 135 - 145 mmol/L   Potassium 3.4 (L) 3.5 - 5.1 mmol/L   Chloride 106 98 - 111 mmol/L   CO2 22 22 - 32 mmol/L   Glucose, Bld 113 (H) 70 - 99 mg/dL    Comment: Glucose reference range applies only to samples taken after fasting for at least 8 hours.   BUN 22 8 - 23 mg/dL   Creatinine, Ser 0.72 0.61 - 1.24 mg/dL   Calcium 8.5 (L) 8.9 - 10.3 mg/dL   GFR, Estimated >60 >60 mL/min    Comment: (NOTE) Calculated using the CKD-EPI Creatinine Equation (2021)    Anion gap 7 5 - 15    Comment: Performed at [facility], [address] [place], [place] [address]  Glucose, capillary     Status: Abnormal   Collection Time: 09/30/22  8:25 AM  Result Value Ref Range   Glucose-Capillary 115 (H) 70 - 99 mg/dL    Comment: Glucose reference range applies only to samples taken after fasting for at least 8 hours.  Glucose, capillary     Status: Abnormal   Collection Time: 09/30/22 11:54 AM  Result Value Ref Range   Glucose-Capillary 163 (H) 70 - 99 mg/dL    Comment: Glucose reference range applies only to samples taken after fasting for at least 8 hours.  Glucose, capillary     Status: Abnormal   Collection Time: 09/30/22  4:15 PM  Result Value Ref Range   Glucose-Capillary 136 (H) 70 - 99 mg/dL    Comment: Glucose reference range applies only to samples taken after fasting for at least 8 hours.  Glucose, capillary     Status: Abnormal   Collection Time: 09/30/22  8:47 PM  Result Value Ref Range   Glucose-Capillary 111 (H) 70 - 99 mg/dL    Comment: Glucose reference range applies only to samples taken after fasting for at least 8 hours.  Glucose, capillary     Status: Abnormal   Collection Time: 10/01/22  8:26 AM  Result Value Ref Range   Glucose-Capillary 114 (H) 70 - 99 mg/dL    Comment: Glucose reference range applies only to samples taken after fasting for at least 8 hours.    Current Facility-Administered Medications  Medication Dose Route Frequency Provider Last Rate  Last Admin   0.9 %  sodium chloride infusion  250 mL Intravenous Continuous Emeterio Reeve, DO   Held at 09/27/22 1211   acetaminophen (TYLENOL) tablet 650 mg  650 mg Oral Q6H PRN Cox, Amy N, DO   650 mg at 10/01/22 [id number]   Or   acetaminophen (TYLENOL) suppository 650 mg  650 mg Rectal Q6H PRN Cox, Amy N, DO       albuterol (PROVENTIL) (2.5 MG/3ML) 0.083% nebulizer solution 2.5 mg  2.5 mg Nebulization Q6H PRN Cox, Amy N, DO       aspirin EC tablet 81 mg  81 mg Oral Daily Cox, Amy N, DO   81 mg at 10/01/22 0848   Chlorhexidine Gluconate Cloth 2 % PADS 6 each  6 each Topical Daily Emeterio Reeve, DO   6 each at 10/01/22 0852   feeding supplement (ENSURE ENLIVE / ENSURE PLUS) liquid 237 mL  237 mL Oral BID BM Emeterio Reeve, DO   237 mL at 10/01/22 0848   insulin aspart (novoLOG) injection 0-5 Units  0-5 Units Subcutaneous QHS Cox, Amy N,  DO       insulin aspart (novoLOG) injection 0-9 Units  0-9 Units Subcutaneous TID WC Cox, Amy N, DO   1 Units at 09/30/22 1635   LORazepam (ATIVAN) tablet 0.5 mg  0.5 mg Oral Q6H PRN Patrecia Pour, NP       melatonin tablet 5 mg  5 mg Oral QHS PRN Cox, Amy N, DO       menthol-cetylpyridinium (CEPACOL) lozenge 3 mg  1 lozenge Oral PRN Cox, Amy N, DO       midodrine (PROAMATINE) tablet 10 mg  10 mg Oral TID WC Teressa Lower, NP   10 mg at 10/01/22 0848   mometasone-formoterol (DULERA) 100-5 MCG/ACT inhaler 2 puff  2 puff Inhalation BID Cox, Amy N, DO   2 puff at 10/01/22 [id number]   multivitamin with minerals tablet 1 tablet  1 tablet Oral Daily Emeterio Reeve, DO   1 tablet at 10/01/22 0848   nitroGLYCERIN (NITROSTAT) SL tablet 0.4 mg  0.4 mg Sublingual Q5 min PRN Cox, Amy N, DO       nystatin cream (MYCOSTATIN)   Topical BID PRN Cox, Amy N, DO       ondansetron (ZOFRAN) tablet 4 mg  4 mg Oral Q6H PRN Cox, Amy N, DO       Or   ondansetron (ZOFRAN) injection 4 mg  4 mg Intravenous Q6H PRN Cox, Amy N, DO       pantoprazole (PROTONIX) EC tablet 40 mg  40  mg Oral QAC breakfast Cox, Amy N, DO   40 mg at 10/01/22 0848   risperiDONE (RISPERDAL) tablet 1 mg  1 mg Oral BID Patrecia Pour, NP   1 mg at 10/01/22 [id number]   rivaroxaban (XARELTO) tablet 20 mg  20 mg Oral Q supper Cox, Amy N, DO   20 mg at 09/30/22 1636   senna (SENOKOT) tablet 17.2 mg  2 tablet Oral QHS Cox, Amy N, DO   17.2 mg at 09/30/22 2122   senna-docusate (Senokot-S) tablet 1 tablet  1 tablet Oral QHS PRN Cox, Amy N, DO       simvastatin (ZOCOR) tablet 20 mg  20 mg Oral QHS Cox, Amy N, DO   20 mg at 09/30/22 2122    Musculoskeletal: Strength & Muscle Tone: decreased Gait & Station:  did not witness Patient leans: N/A  Psychiatric Specialty Exam: Physical Exam Vitals and nursing note reviewed.  Constitutional:      Appearance: Normal appearance.  HENT:     Head: Normocephalic.     Nose: Nose normal.  Pulmonary:     Effort: Pulmonary effort is normal.  Musculoskeletal:     Cervical back: Normal range of motion.  Neurological:     General: No focal deficit present.     Mental Status: He is alert and oriented to person, place, and time.  Psychiatric:        Attention and Perception: Attention and perception normal.        Mood and Affect: Mood is anxious and depressed.        Speech: Speech normal.        Behavior: Behavior normal. Behavior is cooperative.        Thought Content: Thought content normal.        Cognition and Memory: Cognition and memory normal.        Judgment: Judgment normal.     Review of Systems  Psychiatric/Behavioral:  Positive for depression. The patient is  nervous/anxious.   All other systems reviewed and are negative.   Blood pressure 115/69, pulse 88, temperature 98.1 F (36.7 C), resp. rate 18, height '5\' 5"'$  (1.651 m), weight 91.5 kg, SpO2 90 %.Body mass index is 33.57 kg/m.  General Appearance: Disheveled  Eye Contact:  Fair  Speech:  Slow  Volume:  WDL  Mood:  Anxious and Depressed  Affect:  Congruent  Thought Process:  Coherent   Orientation:  Full (Time, Place, and Person)  Thought Content:  Logical  Suicidal Thoughts:  No  Homicidal Thoughts:  No  Memory:  Immediate;   Good Recent;   Good  Judgement:  Fair  Insight:  Fair  Psychomotor Activity:  Tremor  Concentration:  Concentration: Good and Attention Span: Good  Recall:  [employer] of Knowledge:  Fair  Language:  Good  Akathisia:  No  Handed:  Right  AIMS (if indicated):     Assets:  Desire for Improvement Physical Health Resilience  ADL's:  Impaired  Cognition:  Impaired,  Mild  Sleep:   Impaired in hospital, otherwise reports it is adequate      Physical Exam: Physical Exam Vitals and nursing note reviewed.  Constitutional:      Appearance: Normal appearance.  HENT:     Head: Normocephalic.     Nose: Nose normal.  Pulmonary:     Effort: Pulmonary effort is normal.  Musculoskeletal:     Cervical back: Normal range of motion.  Neurological:     General: No focal deficit present.     Mental Status: He is alert and oriented to person, place, and time.  Psychiatric:        Attention and Perception: Attention and perception normal.        Mood and Affect: Mood is anxious and depressed.        Speech: Speech normal.        Behavior: Behavior normal. Behavior is cooperative.        Thought Content: Thought content normal.        Cognition and Memory: Cognition and memory normal.        Judgment: Judgment normal.    Review of Systems  Psychiatric/Behavioral:  Positive for depression. The patient is nervous/anxious.   All other systems reviewed and are negative.  Blood pressure 115/69, pulse 88, temperature 98.1 F (36.7 C), resp. rate 18, height '5\' 5"'$  (1.651 m), weight 91.5 kg, SpO2 90 %. Body mass index is 33.57 kg/m.  Treatment Plan Summary: Schizophrenia, undifferentiated: Continue Risperdal 1 mg BID, Haldol discontinued yesterday Ativan 0.5 mg every six hours PRN anxiety  Disposition: No evidence of imminent risk to self or  others at present.   Patient does not meet criteria for psychiatric inpatient admission. Supportive therapy provided about ongoing stressors.  Waylan Boga, NP 10/01/2022 11:49 AM

## 2022-10-01 NOTE — Progress Notes (Signed)
PROGRESS NOTE    Evan [name]   [id number] DOB: [birth year]  DOA: 09/26/2022 Date of Service: 10/01/22 PCP: [name], FNP     Brief Narrative / Hospital Course:  Evan [name] is a 67 year old male with history of hyperlipidemia, paranoid schizophrenia, CAD, non-insulin-dependent diabetes mellitus, hypertension, obesity, diabetic retinopathy, who presents emergency department for chief concerns of dizziness from [facility] via EMS. 02/20: afebrile, RR 21, tachycardic at HR 120, blood pressure 101/72, SpO2 94% on room air. Serum creatinine 1.55, EGFR 48, WBC 18.5, hemoglobin 14.3, platelets 512, Lactic acid was 3.3 and on repeat was 3.0. COVID/influenza A/influenza B/RSV PCR were negative. UA was negative for leukocytes and nitrates. Blood cultures x 2 have been ordered and are in process.CT abdomen pelvis with contrast: Was read as cholelithiasis without evidence of acute cholecystitis.  Fat-containing umbilical hernia.  Otherwise stable appearance of the abdomen pelvis without acute finding.  Aortic atherosclerosis. ED treatment: Cefepime and vancomycin per pharmacy.  LR 1 L bolus. Admitted to hospitalist service for sepsis unknown source. 02/21: hypotensive overnight, responsive to fluid bolus, BP still low in AM, started midodrine and gave another 1L bolus, BP held briefly --> pt transferred to stepdown for norepi gtt. C/o chest pressure, (+)murmur on cardiac exam, ?rales/atelectasis --> Echo, BNP elevated at 428, CXR, Troponin, EKG. Eval acidosis --> VBG was ok 02/22: BP improving, off norepi approx 10:00 am, scheduled midodrine and stress dose steroids (Cortisol level 20).  BCx NGx2d, GI PCR and CDiff pending, repeat BNP higher at 988, lactic and WBC coming down. Echo LVEF 50-55% w/ low normal fxn, mild LVH, indeterminate diastolic, moderate hypokinesis inferolateral LV, moderate aortic valve stenosis. Net IO Since Admission: 3,207.89 mL [09/28/22 1440] 02/23: BP remains  stable but soft. Neurology eval - Possible extrapyramidal symptoms from antipsychotics --> request psych eval for rx mgt and d/c haldol, MRI brain ordered and EEG though CVA/seizures unlikely. EEG no concerns. Discontinued Haldol 1 mg at bedtime and increased his Risperdal 0.5 mg BID to 1 mg BID per psychiatry.  02/24: BP remains stable. WBC coming down now 11.8. MRI brain pending. B12 pending. Ammonia and TSH WNL.  02/25: MRI brain no concerns, outpatient neurological follow up 8-12 wk. Psychaitry team recs continue Risperdal 1 mg bid, Ativan 0.5 mg q6h prn anxiety. Today is Sunday, TOC to work on placement tomorrow. BP remains soft but has improved and has been stable.     Consultants:  Neurology Psychiatry   Procedures: none      ASSESSMENT & PLAN:   Principal Problem:   SIRS (systemic inflammatory response syndrome) (HCC) Active Problems:   Hypertension associated with diabetes ([place])   Esophageal dysmotility   Chronic diastolic CHF (congestive heart failure) (HCC)   Paranoid schizophrenia ([place])   Type 2 diabetes mellitus with morbid obesity (HCC)   Obstructive sleep apnea on CPAP   Hyperlipidemia associated with type 2 diabetes mellitus (HCC)   Persistent atrial fibrillation (HCC)   CAD (coronary artery disease)   Hoarding disorder   COPD, mild (HCC)   Atherosclerosis of aorta (HCC)   History of CVA (cerebrovascular accident)   Undifferentiated schizophrenia ([place])   Leukocytosis   AKI (acute kidney injury) ([place])   Shock circulatory (HCC)   Elevated lactic acid level   Left ventricular hypokinesis inferolateral wall   Moderate aortic stenosis   Hypotension - improved  SIRS (systemic inflammatory response syndrome) (HCC) / Severe sepsis unknown source Altered mental status - improved  Uncertain etiology Follow BCx -->  NG TSH WNL  Continue with cefepime and vancomycin per pharmacy, total 5-7 days abx  Continue scheduled midodrine - trial reducing dose today   stress dose steroids initiated by ICU team, these have been d/c  Left ventricular hypokinesis inferolateral wall Moderate aortic stenosis Consider cardiology consult if worse, otherwise should be able to follow outpatient  Consider low dose Lasix if BP tolerates  Strict I&O  Tremor, question parkinsonism (primary vs med effect) Pt reprots numbness lower extremities and "can't move" but then able to move somewhat, question neuro sensory/motor deficit Possible extrapyramidal symptoms from antipsychotics  EEG and TSH WNL, MRI no acute findings, B12, ammonia WNL Discontinued Haldol 1 mg at bedtime and increased his Risperdal 0.5 mg BID to 1 mg BID per psychiatry Neurology to follow outpatient in 8-12 weeks  AKI (acute kidney injury) ([place]) - resolved Monitor BMP   Leukocytosis Etiology workup in progress, treat per above Monitor CBC   History of CVA (cerebrovascular accident) aspirin 81 mg daily simvastatin 20 mg nightly  Essential Hypertension Holding home metoprolol succinate 50 mg daily at this time due to lower BP   COPD, mild ([place]) Not in acute exacerbation at this time Albuterol nebulizer, every 6 hours as needed for wheezing and shortness of breath, 5 days ordered   Hyperlipidemia associated with type 2 diabetes mellitus (HCC) Simvastatin 20 mg nightly resumed   Paranoid schizophrenia [facility]) Psychiatry consulted  Concern for haldol causing extrapyramidal symptoms  Discontinued Haldol 1 mg at bedtime  Continue Risperdal at increased dose of 1 mg BID per psychiatry   History Atrial Fibrillation Continue Xarelto  Beta blocker on hold d/t hypotension    DVT prophylaxis: Xarelto Pertinent IV fluids/nutrition: no conitnuous IV fluids  Central lines / invasive devices: none  Code Status: FULL CODE   Current Admission Status: inpatient  TOC needs / Dispo plan: SNF placement Barriers to discharge / significant pending items: TOC to work on placement tomorrow, patient  is medically stable today              Subjective / Brief ROS:  Patient has no concerns today     Family Communication: none at this time, will reach out to his guardian Monday re placement / pending results     Objective Findings:  Vitals:   10/01/22 0416 10/01/22 0422 10/01/22 0749 10/01/22 1142  BP: 117/75  (!) 82/63 115/69  Pulse: 91  85 88  Resp: '18  18 18  '$ Temp: 98.1 F (36.7 C)  98.3 F (36.8 C) 98.1 F (36.7 C)  TempSrc: Oral     SpO2: 96%  96% 90%  Weight:  91.5 kg    Height:        Intake/Output Summary (Last 24 hours) at 10/01/2022 1507 Last data filed at 10/01/2022 1400 Gross per 24 hour  Intake 1320 ml  Output 1350 ml  Net -30 ml   Filed Weights   09/29/22 0500 09/30/22 0429 10/01/22 0422  Weight: 88.4 kg 92.1 kg 91.5 kg    Examination:  Physical Exam Constitutional:      General: He is not in acute distress.    Appearance: Normal appearance. He is not toxic-appearing.  HENT:     Left Ear: External ear normal.  Eyes:     Extraocular Movements: Extraocular movements intact.  Cardiovascular:     Rate and Rhythm: Normal rate and regular rhythm.     Heart sounds: Murmur heard.  Pulmonary:     Effort: No respiratory distress.  Breath sounds: No rales.  Abdominal:     General: Abdomen is flat.     Palpations: Abdomen is soft.     Tenderness: There is no abdominal tenderness.  Musculoskeletal:     Right lower leg: No edema.     Left lower leg: No edema.  Skin:    General: Skin is warm and dry.  Neurological:     General: No focal deficit present.     Mental Status: He is alert. He is disoriented.     Cranial Nerves: No cranial nerve deficit.     Sensory: No sensory deficit.     Motor: Weakness present.  Psychiatric:        Mood and Affect: Mood normal.        Behavior: Behavior normal.          Scheduled Medications:   aspirin EC  81 mg Oral Daily   Chlorhexidine Gluconate Cloth  6 each Topical Daily   feeding  supplement  237 mL Oral BID BM   insulin aspart  0-5 Units Subcutaneous QHS   insulin aspart  0-9 Units Subcutaneous TID WC   midodrine  5 mg Oral TID WC   mometasone-formoterol  2 puff Inhalation BID   multivitamin with minerals  1 tablet Oral Daily   pantoprazole  40 mg Oral QAC breakfast   risperiDONE  1 mg Oral BID   rivaroxaban  20 mg Oral Q supper   senna  2 tablet Oral QHS   simvastatin  20 mg Oral QHS    Continuous Infusions:  sodium chloride Stopped (09/27/22 1211)    PRN Medications:  acetaminophen **OR** acetaminophen, albuterol, LORazepam, melatonin, menthol-cetylpyridinium, nitroGLYCERIN, nystatin cream, ondansetron **OR** ondansetron (ZOFRAN) IV, senna-docusate  Antimicrobials from admission:  Anti-infectives (From admission, onward)    Start     Dose/Rate Route Frequency Ordered Stop   09/29/22 1000  cefTRIAXone (ROCEPHIN) 2 g in sodium chloride 0.9 % 100 mL IVPB        2 g 200 mL/hr over 30 Minutes Intravenous Every 24 hours 09/28/22 1110 10/01/22 0843   09/27/22 1100  vancomycin (VANCOCIN) IVPB 1000 mg/200 mL premix  Status:  Discontinued        1,000 mg 200 mL/hr over 60 Minutes Intravenous Every 12 hours 09/27/22 1025 09/28/22 1108   09/27/22 1015  ceFEPIme (MAXIPIME) 2 g in sodium chloride 0.9 % 100 mL IVPB        2 g 200 mL/hr over 30 Minutes Intravenous Every 8 hours 09/27/22 1012 09/28/22 2230   09/27/22 0030  ceFEPIme (MAXIPIME) 2 g in sodium chloride 0.9 % 100 mL IVPB  Status:  Discontinued        2 g 200 mL/hr over 30 Minutes Intravenous Every 12 hours 09/26/22 1647 09/27/22 1012   09/26/22 1630  vancomycin (VANCOREADY) IVPB 750 mg/150 mL        750 mg 150 mL/hr over 60 Minutes Intravenous  Once 09/26/22 1546 09/26/22 1931   09/26/22 1230  ceFEPIme (MAXIPIME) 2 g in sodium chloride 0.9 % 100 mL IVPB        2 g 200 mL/hr over 30 Minutes Intravenous  Once 09/26/22 1220 09/26/22 1302   09/26/22 1230  vancomycin (VANCOCIN) IVPB 1000 mg/200 mL premix         1,000 mg 200 mL/hr over 60 Minutes Intravenous  Once 09/26/22 1220 09/26/22 1410           Data Reviewed:  I have personally reviewed the  following...  CBC: Recent Labs  Lab 09/26/22 1010 09/27/22 0529 09/28/22 0620 09/29/22 0546 09/30/22 0445  WBC 18.5* 13.2* 13.0* 13.2* 11.8*  NEUTROABS  --   --   --  11.8*  --   HGB 14.3 11.7* 11.7* 10.8* 11.6*  HCT 44.7 36.8* 37.1* 33.7* 36.0*  MCV 88.7 90.4 90.7 88.2 89.3  PLT 512* 393 396 378 0000000   Basic Metabolic Panel: Recent Labs  Lab 09/26/22 1010 09/27/22 0529 09/27/22 1937 09/28/22 0620 09/29/22 0546 09/30/22 0445  NA 137 136  --  137 135 135  K 3.7 3.4* 3.3* 4.0 3.6 3.4*  CL 104 106  --  108 106 106  CO2 18* 20*  --  18* 22 22  GLUCOSE 151* 120*  --  124* 159* 113*  BUN 34* 24*  --  '15 21 22  '$ CREATININE 1.55* 0.86  --  0.77 0.72 0.72  CALCIUM 9.2 8.3*  --  8.4* 8.3* 8.5*  MG  --   --  2.1 2.1 2.2  --   PHOS  --   --  2.9 3.0 2.6  --    GFR: Estimated Creatinine Clearance: 91.9 mL/min (by C-G formula based on SCr of 0.72 mg/dL). Liver Function Tests: Recent Labs  Lab 09/27/22 1449 09/28/22 0620  AST 78* 59*  ALT 31 30  ALKPHOS 72 73  BILITOT 0.9 1.2  PROT 5.2* 5.4*  ALBUMIN 2.3* 2.3*   No results for input(s): "LIPASE", "AMYLASE" in the last 168 hours. Recent Labs  Lab 09/30/22 0445  AMMONIA 15   Coagulation Profile: No results for input(s): "INR", "PROTIME" in the last 168 hours. Cardiac Enzymes: No results for input(s): "CKTOTAL", "CKMB", "CKMBINDEX", "TROPONINI" in the last 168 hours. BNP (last 3 results) No results for input(s): "PROBNP" in the last 8760 hours. HbA1C: No results for input(s): "HGBA1C" in the last 72 hours.  CBG: Recent Labs  Lab 09/30/22 1615 09/30/22 2047 10/01/22 0826 10/01/22 1211 10/01/22 1220  GLUCAP 136* 111* 114* 146* 148*   Lipid Profile: No results for input(s): "CHOL", "HDL", "LDLCALC", "TRIG", "CHOLHDL", "LDLDIRECT" in the last 72 hours. Thyroid  Function Tests: Recent Labs    09/30/22 0445  TSH 2.036   Anemia Panel: Recent Labs    09/30/22 0445  VITAMINB12 501   Most Recent Urinalysis On File:     Component Value Date/Time   COLORURINE YELLOW (A) 09/26/2022 1117   APPEARANCEUR HAZY (A) 09/26/2022 1117   APPEARANCEUR Clear 09/20/2021 0856   LABSPEC 1.013 09/26/2022 1117   LABSPEC 1.013 11/29/2014 1120   PHURINE 5.0 09/26/2022 1117   GLUCOSEU >=500 (A) 09/26/2022 1117   GLUCOSEU Negative 11/29/2014 1120   HGBUR LARGE (A) 09/26/2022 1117   BILIRUBINUR NEGATIVE 09/26/2022 1117   BILIRUBINUR Negative 09/20/2021 0856   BILIRUBINUR Negative 11/29/2014 1120   KETONESUR NEGATIVE 09/26/2022 1117   PROTEINUR 30 (A) 09/26/2022 1117   NITRITE NEGATIVE 09/26/2022 1117   LEUKOCYTESUR NEGATIVE 09/26/2022 1117   LEUKOCYTESUR Negative 11/29/2014 1120   Sepsis Labs: '@LABRCNTIP'$ (procalcitonin:4,lacticidven:4) Microbiology: Recent Results (from the past 240 hour(s))  Resp panel by RT-PCR (RSV, Flu A&B, Covid) Anterior Nasal Swab     Status: None   Collection Time: 09/26/22 10:03 AM   Specimen: Anterior Nasal Swab  Result Value Ref Range Status   SARS Coronavirus 2 by RT PCR NEGATIVE NEGATIVE Final    Comment: (NOTE) SARS-CoV-2 target nucleic acids are NOT DETECTED.  The SARS-CoV-2 RNA is generally detectable in upper respiratory specimens during the  acute phase of infection. The lowest concentration of SARS-CoV-2 viral copies this assay can detect is 138 copies/mL. A negative result does not preclude SARS-Cov-2 infection and should not be used as the sole basis for treatment or other patient management decisions. A negative result may occur with  improper specimen collection/handling, submission of specimen other than nasopharyngeal swab, presence of viral mutation(s) within the areas targeted by this assay, and inadequate number of viral copies(<138 copies/mL). A negative result must be combined with clinical observations,  patient history, and epidemiological information. The expected result is Negative.  Fact Sheet for Patients:  EntrepreneurPulse.com.au  Fact Sheet for Healthcare Providers:  IncredibleEmployment.be  This test is no t yet approved or cleared by the [place] FDA and  has been authorized for detection and/or diagnosis of SARS-CoV-2 by FDA under an Emergency Use Authorization (EUA). This EUA will remain  in effect (meaning this test can be used) for the duration of the COVID-19 declaration under Section 564(b)(1) of the Act, 21 U.S.C.section 360bbb-3(b)(1), unless the authorization is terminated  or revoked sooner.       Influenza A by PCR NEGATIVE NEGATIVE Final   Influenza B by PCR NEGATIVE NEGATIVE Final    Comment: (NOTE) The Xpert Xpress SARS-CoV-2/FLU/RSV plus assay is intended as an aid in the diagnosis of influenza from Nasopharyngeal swab specimens and should not be used as a sole basis for treatment. Nasal washings and aspirates are unacceptable for Xpert Xpress SARS-CoV-2/FLU/RSV testing.  Fact Sheet for Patients: EntrepreneurPulse.com.au  Fact Sheet for Healthcare Providers: IncredibleEmployment.be  This test is not yet approved or cleared by the [place] FDA and has been authorized for detection and/or diagnosis of SARS-CoV-2 by FDA under an Emergency Use Authorization (EUA). This EUA will remain in effect (meaning this test can be used) for the duration of the COVID-19 declaration under Section 564(b)(1) of the Act, 21 U.S.C. section 360bbb-3(b)(1), unless the authorization is terminated or revoked.     Resp Syncytial Virus by PCR NEGATIVE NEGATIVE Final    Comment: (NOTE) Fact Sheet for Patients: EntrepreneurPulse.com.au  Fact Sheet for Healthcare Providers: IncredibleEmployment.be  This test is not yet approved or cleared by the [place] FDA and has been authorized for detection and/or diagnosis of SARS-CoV-2 by FDA under an Emergency Use Authorization (EUA). This EUA will remain in effect (meaning this test can be used) for the duration of the COVID-19 declaration under Section 564(b)(1) of the Act, 21 U.S.C. section 360bbb-3(b)(1), unless the authorization is terminated or revoked.  Performed at [facility], [address] [place], [place] [address]   Blood Culture (routine x 2)     Status: None (Preliminary result)   Collection Time: 09/26/22 11:17 AM   Specimen: BLOOD RIGHT ARM  Result Value Ref Range Status   Specimen Description BLOOD RIGHT ARM  Final   Special Requests   Final    BOTTLES DRAWN AEROBIC AND ANAEROBIC Blood Culture results may not be optimal due to an excessive volume of blood received in culture bottles   Culture   Final    NO GROWTH 4 DAYS Performed at [facility], [address], [place], [place] [address]    Report Status PENDING  Incomplete  Blood Culture (routine x 2)     Status: None (Preliminary result)   Collection Time: 09/26/22 11:17 AM   Specimen: BLOOD LEFT ARM  Result Value Ref Range Status   Specimen Description BLOOD LEFT ARM  Final   Special Requests  Final    BOTTLES DRAWN AEROBIC AND ANAEROBIC Blood Culture results may not be optimal due to an excessive volume of blood received in culture bottles   Culture   Final    NO GROWTH 4 DAYS Performed at [facility], [address] [place], [place] [address]    Report Status PENDING  Incomplete  MRSA Next Gen by PCR, Nasal     Status: None   Collection Time: 09/27/22 12:08 PM   Specimen: Nasal Mucosa; Nasal Swab  Result Value Ref Range Status   MRSA by PCR Next Gen NOT DETECTED NOT DETECTED Final    Comment: (NOTE) The GeneXpert MRSA Assay (FDA approved for NASAL specimens only), is one component of a comprehensive MRSA colonization surveillance program. It is not intended to  diagnose MRSA infection nor to guide or monitor treatment for MRSA infections. Test performance is not FDA approved in patients less than 12 years old. Performed at [facility], [address] [place], [place] [address]       Radiology Studies last 3 days: MR BRAIN [name] CONTRAST  Result Date: 09/30/2022 CLINICAL DATA:  Delirium EXAM: MRI HEAD WITHOUT CONTRAST TECHNIQUE: Multiplanar, multiecho pulse sequences of the brain and surrounding structures were obtained without intravenous contrast. COMPARISON:  11/06/2020 FINDINGS: Brain: No acute infarct, mass effect or extra-axial collection. No chronic microhemorrhage or siderosis. There is multifocal hyperintense T2-weighted signal within the white matter. Generalized volume loss. Old right cerebellar and right thalamic small vessel infarcts. The midline structures are normal. Vascular: Normal flow voids. Skull and upper cervical spine: Normal marrow signal. Sinuses/Orbits: Negative. Other: None. IMPRESSION: 1. No acute intracranial abnormality. 2. Old right cerebellar and right thalamic small vessel infarct and findings of chronic small vessel ischemia. Electronically Signed   By: Ulyses Jarred M.D.   On: 09/30/2022 21:07   EEG adult  Result Date: 09/29/2022 Lora Havens, MD     09/29/2022  7:43 PM Patient Name: MOTAZ ALBERS MRN: [id number] Epilepsy Attending: Lora Havens Referring Physician/Provider: Amie [place], MD Date: 09/29/2022 Duration: 24.48 mins Patient history: 67yo M with ams. EEG to evaluate for seizure Level of alertness: Awake AEDs during EEG study: None Technical aspects: This EEG study was done with scalp electrodes positioned according to the 10-20 International system of electrode placement. Electrical activity was reviewed with band pass filter of 1-'70Hz'$ , sensitivity of 7 uV/mm, display speed of 30m/sec with a '60Hz'$  notched filter applied as appropriate. EEG data were recorded continuously and digitally stored.   Video monitoring was available and reviewed as appropriate. Description: No clear posterior dominant rhythm was seen. EEG showed continuous generalized predominantly 5-'7Hz'$  Hz theta slowing admixed with intermittent generalized rhythmic 2-'3Hz'$  delta slowing. Hyperventilation and photic stimulation were not performed.   ABNORMALITY - Continuous slow, generalized IMPRESSION: This study is suggestive of moderate diffuse encephalopathy, nonspecific etiology. No seizures or epileptiform discharges were seen throughout the recording. [place]            LOS: 5 days      NEmeterio Reeve DO [facility] 10/01/2022, 3:07 PM    Dictation software may have been used to generate the above note. Typos may occur and escape review in typed/dictated notes. Please contact Dr ASheppard Coildirectly for clarity if needed.  Staff may message me via secure chat in [facility] but this may not receive an immediate response,  please page me for urgent matters!  If 7PM-7AM, please contact night coverage [email or web address]

## 2022-10-02 DIAGNOSIS — F203 Undifferentiated schizophrenia: Secondary | ICD-10-CM | POA: Diagnosis not present

## 2022-10-02 LAB — CBC
HCT: 33.9 % — ABNORMAL LOW (ref 39.0–52.0)
Hemoglobin: 10.8 g/dL — ABNORMAL LOW (ref 13.0–17.0)
MCH: 28.6 pg (ref 26.0–34.0)
MCHC: 31.9 g/dL (ref 30.0–36.0)
MCV: 89.7 fL (ref 80.0–100.0)
Platelets: 380 10*3/uL (ref 150–400)
RBC: 3.78 MIL/uL — ABNORMAL LOW (ref 4.22–5.81)
RDW: 15.4 % (ref 11.5–15.5)
WBC: 11.4 10*3/uL — ABNORMAL HIGH (ref 4.0–10.5)
nRBC: 0 % (ref 0.0–0.2)

## 2022-10-02 LAB — BASIC METABOLIC PANEL
Anion gap: 5 (ref 5–15)
BUN: 17 mg/dL (ref 8–23)
CO2: 25 mmol/L (ref 22–32)
Calcium: 8.3 mg/dL — ABNORMAL LOW (ref 8.9–10.3)
Chloride: 106 mmol/L (ref 98–111)
Creatinine, Ser: 0.7 mg/dL (ref 0.61–1.24)
GFR, Estimated: >60 mL/min (ref >60)
Glucose, Bld: 121 mg/dL — ABNORMAL HIGH (ref 70–99)
Potassium: 4 mmol/L (ref 3.5–5.1)
Sodium: 136 mmol/L (ref 135–145)

## 2022-10-02 LAB — GLUCOSE, CAPILLARY
Glucose-Capillary: 106 mg/dL — ABNORMAL HIGH (ref 70–99)
Glucose-Capillary: 189 mg/dL — ABNORMAL HIGH (ref 70–99)
Glucose-Capillary: 102 mg/dL — ABNORMAL HIGH (ref 70–99)
Glucose-Capillary: 163 mg/dL — ABNORMAL HIGH (ref 70–99)

## 2022-10-02 NOTE — Progress Notes (Signed)
Occupational Therapy Treatment Patient Details Name: Evan [name] MRN: [id number] DOB: 12-03-1955 Today's Date: 10/02/2022   History of present illness Pt is a 67 year old male admitted with SIRS (systemic inflammatory response syndrome) ([place]) / Severe sepsis unknown source, AMS; PMH significant for hyperlipidemia, paranoid schizophrenia, CAD, non-insulin-dependent diabetes mellitus, hypertension, obesity, diabetic retinopathy   OT comments  Pt received semi-reclined in bed. Appearing alert; willing to work with OT on grooming and standing. T/f MAX A with HOB raised. See flowsheet below for further details of session. Left semi-reclined in bed with all needs in reach.  Patient will benefit from continued OT while in acute care.    Recommendations for follow up therapy are one component of a multi-disciplinary discharge planning process, led by the attending physician.  Recommendations may be updated based on patient status, additional functional criteria and insurance authorization.    Follow Up Recommendations  Skilled nursing-short term rehab (<3 hours/day)     Assistance Recommended at Discharge Frequent or constant Supervision/Assistance  Patient can return home with the following  Two people to help with walking and/or transfers;A lot of help with bathing/dressing/bathroom;Assistance with cooking/housework;Direct supervision/assist for medications management;Direct supervision/assist for financial management;Assist for transportation;Help with stairs or ramp for entrance   Equipment Recommendations  Other (comment) (defer to next venue of care)    Recommendations for Other Services      Precautions / Restrictions Precautions Precautions: Fall Restrictions Weight Bearing Restrictions: No       Mobility Bed Mobility Overal bed mobility: Needs Assistance Bed Mobility: Supine to Sit, Sit to Supine     Supine to sit: Max assist, HOB elevated Sit to supine: Max assist, HOB  elevated   General bed mobility comments: +2 TOTAL A for boost in bed; improvements noted in supine>sit with pt attempting to move BLE along the bed, ultimately requiring max A however    Transfers Overall transfer level: Needs assistance Equipment used: Rolling walker (2 wheels) Transfers: Sit to/from Stand Sit to Stand: Max assist, +2 physical assistance, Mod assist           General transfer comment: Unable to attain full upright standing, able to clear buttocks from the bed. Static atanding 30-24 seconds at a time x 4     Balance Overall balance assessment: Needs assistance Sitting-balance support: No upper extremity supported, Feet supported Sitting balance-Leahy Scale: Fair Sitting balance - Comments: Able to sit EOB 30+ Minutes   Standing balance support: Bilateral upper extremity supported, Reliant on assistive device for balance Standing balance-Leahy Scale: Zero Standing balance comment: Unable to achieve full upright position                           ADL either performed or assessed with clinical judgement   ADL Overall ADL's : Needs assistance/impaired     Grooming: Wash/dry hands;Brushing hair;Moderate assistance;Sitting Grooming Details (indicate cue type and reason): Pt requiring MOD A while seated at EOB; decreased initiation and decreased ability to reach BIL hands to the back and top of head 2/2 shoulder deficits. Decreased hand strength to scrub junk off of his hands. Upper Body Bathing: Total assistance Upper Body Bathing Details (indicate cue type and reason): OT assisted dependently with pt using shampoo cap (no rinse feature). Pt with what appeared to be dandruff, but also likely to be adhesive from EEG (previously).               [employer]ed forces technical officer Details (indicate  cue type and reason): Pt only able to half stand today, but enough to place bedpan under patient while at EOB. MAX A for half-stand Toileting- Clothing Manipulation and  Hygiene: Total assistance Toileting - Clothing Manipulation Details (indicate cue type and reason): dependent bowel hygiene half-standing (with MAX A of another person for the half-stand and maintaining half-stand), then later rolling in bed (MOD-MAX A to roll, then dependent hygiene)       General ADL Comments: Good sitting balance at EOB today.    Extremity/Trunk Assessment Upper Extremity Assessment Upper Extremity Assessment: Generalized weakness (decreased shoulder ROM BIL) RUE Deficits / Details: Pt with decreased ability to push nurse call button; took several attempts for pt to be able to push it in with enough force to trip the call bell            Vision       Perception     Praxis      Cognition Arousal/Alertness: Awake/alert Behavior During Therapy: Flat affect Overall Cognitive Status: No family/caregiver present to determine baseline cognitive functioning                                 General Comments: Pt requiring reassurance that he is safe; anxious about falling. Pt follows all cues to the best of his ability. Decreased initiation; needing frequent cues.        Exercises      Shoulder Instructions       General Comments  (Pt required assistance with hygiene after having a BM)    Pertinent Vitals/ Pain       Pain Assessment Pain Assessment: 0-10 Pain Score:  (Pt states that he has pain in his feet, but does not rate) Pain Location: feet Pain Descriptors / Indicators: Other (Comment) (pt does not make any pain indicators with activity or rest) Pain Intervention(s): Limited activity within patient's tolerance  Home Living                                          Prior Functioning/Environment              Frequency  Min 2X/week        Progress Toward Goals  OT Goals(current goals can now be found in the care plan section)  Progress towards OT goals: Progressing toward goals  Acute Rehab OT  Goals Patient Stated Goal: Walk again OT Goal Formulation: With patient Time For Goal Achievement: 10/12/22 Potential to Achieve Goals: Good ADL Goals Pt Will Perform Grooming: with min assist;sitting Pt Will Perform Lower Body Dressing: with mod assist;sitting/lateral leans Pt Will Transfer to Toilet: with min assist Pt Will Perform Toileting - Clothing Manipulation and hygiene: with mod assist Pt/caregiver will Perform Home Exercise Program: Increased strength;Right Upper extremity;Left upper extremity  Plan Discharge plan remains appropriate;Frequency remains appropriate    Co-evaluation    PT/OT/SLP Co-Evaluation/Treatment: Yes Reason for Co-Treatment: Complexity of the patient's impairments (multi-system involvement);For patient/therapist safety;To address functional/ADL transfers PT goals addressed during session: Mobility/safety with mobility;Balance;Strengthening/ROM OT goals addressed during session: ADL's and self-care;Strengthening/ROM      AM-PAC OT "6 Clicks" Daily Activity     Outcome Measure   Help from another person eating meals?: A Little Help from another person taking care of personal grooming?: A Lot Help from another person toileting, which  includes using toliet, bedpan, or urinal?: Total Help from another person bathing (including washing, rinsing, drying)?: Total Help from another person to put on and taking off regular upper body clothing?: A Little Help from another person to put on and taking off regular lower body clothing?: A Lot 6 Click Score: 12    End of Session Equipment Utilized During Treatment: Rolling walker (2 wheels)  OT Visit Diagnosis: Other abnormalities of gait and mobility (R26.89);Unsteadiness on feet (R26.81);Muscle weakness (generalized) (M62.81)   Activity Tolerance Patient tolerated treatment well   Patient Left in bed;with call bell/phone within reach;with bed alarm set   Nurse Communication Mobility status        Time:  [id number] OT Time Calculation (min): 58 min  Charges: OT General Charges $OT Visit: 1 Visit OT Treatments $Self Care/Home Management : 23-37 mins  Waymon Amato, MS, OTR/L   Vania Rea 10/02/2022, 2:11 PM

## 2022-10-02 NOTE — Progress Notes (Signed)
Physical Therapy Treatment Patient Details Name: Evan [name] MRN: [id number] DOB: July 11, 1956 Today's Date: 10/02/2022   History of Present Illness Pt is a 67 year old male admitted with SIRS (systemic inflammatory response syndrome) ([place]) / Severe sepsis unknown source, AMS; PMH significant for hyperlipidemia, paranoid schizophrenia, CAD, non-insulin-dependent diabetes mellitus, hypertension, obesity, diabetic retinopathy    PT Comments    Co-tx with OT who was already in the room. Pt participated in sitting EOB with supervision, feet supported while completing dynamic activities with Ue's. Height of bed raised for sit to stand transfers with Mod/MaxA of 2 and RW.  Pt able to raise buttocks off bed, unable to attain upright standing. Pt tolerated 30-45 second increments of standing. Decreased strength B LE's with difficulty completing active range. Pt will benefit from SNF once medically cleared for d/c. Overall, very pleasant and cooperative.   Recommendations for follow up therapy are one component of a multi-disciplinary discharge planning process, led by the attending physician.  Recommendations may be updated based on patient status, additional functional criteria and insurance authorization.  Follow Up Recommendations  Skilled nursing-short term rehab (<3 hours/day) Can patient physically be transported by private vehicle: No   Assistance Recommended at Discharge Frequent or constant Supervision/Assistance  Patient can return home with the following Two people to help with walking and/or transfers;Two people to help with bathing/dressing/bathroom;Assist for transportation   Equipment Recommendations  None recommended by PT    Recommendations for Other Services       Precautions / Restrictions Precautions Precautions: Fall Restrictions Weight Bearing Restrictions: No     Mobility  Bed Mobility Overal bed mobility: Needs Assistance Bed Mobility: Supine to Sit, Sit to  Supine     Supine to sit: Max assist, HOB elevated Sit to supine: Max assist, HOB elevated   General bed mobility comments: +2 TOTAL A for boost in bed; improvements noted in supine>sit with pt attempting to move BLE along the bed, ultimately requiring max A however    Transfers Overall transfer level: Needs assistance Equipment used: Rolling walker (2 wheels) Transfers: Sit to/from Stand Sit to Stand: Max assist, +2 physical assistance, Mod assist           General transfer comment: Unable to attain full upright standing, able to clear buttocks from the bed. Static atanding 30-24 seconds at a time x 4    Ambulation/Gait               General Gait Details:  (Unsafe)   Stairs             Wheelchair Mobility    Modified Rankin (Stroke Patients Only)       Balance Overall balance assessment: Needs assistance Sitting-balance support: No upper extremity supported, Feet supported Sitting balance-Leahy Scale: Fair Sitting balance - Comments: Able to sit EOB 30+ Minutes   Standing balance support: Bilateral upper extremity supported, Reliant on assistive device for balance Standing balance-Leahy Scale: Zero Standing balance comment:  (Pt unable to stand upright at RW)                            Cognition Arousal/Alertness: Awake/alert Behavior During Therapy: Flat affect Overall Cognitive Status: No family/caregiver present to determine baseline cognitive functioning                     Current Attention Level: Focused   Following Commands: Follows multi-step commands consistently   Awareness: Intellectual  Exercises General Exercises - Lower Extremity Ankle Circles/Pumps: AAROM, Both, 5 reps Heel Slides: AAROM, Both, 5 reps Hip ABduction/ADduction: AAROM, Both, 5 reps    General Comments General comments (skin integrity, edema, etc.):  (Pt required assistance with hygiene after having a BM)      Pertinent  Vitals/Pain Pain Assessment Pain Assessment: No/denies pain    Home Living                          Prior Function            PT Goals (current goals can now be found in the care plan section) Acute Rehab PT Goals Patient Stated Goal: Go to a different facility    Frequency    Min 2X/week      PT Plan Current plan remains appropriate    Co-evaluation PT/OT/SLP Co-Evaluation/Treatment: Yes Reason for Co-Treatment: Complexity of the patient's impairments (multi-system involvement);For patient/therapist safety;To address functional/ADL transfers PT goals addressed during session: Mobility/safety with mobility;Balance;Strengthening/ROM        AM-PAC PT "6 Clicks" Mobility   Outcome Measure  Help needed turning from your back to your side while in a flat bed without using bedrails?: A Lot Help needed moving from lying on your back to sitting on the side of a flat bed without using bedrails?: A Lot Help needed moving to and from a bed to a chair (including a wheelchair)?: Total Help needed standing up from a chair using your arms (e.g., wheelchair or bedside chair)?: Total Help needed to walk in hospital room?: Total Help needed climbing 3-5 steps with a railing? : Total 6 Click Score: 8    End of Session Equipment Utilized During Treatment: Gait belt Activity Tolerance: Patient tolerated treatment well Patient left: in bed;with call bell/phone within reach;with bed alarm set Nurse Communication: Mobility status PT Visit Diagnosis: Muscle weakness (generalized) (M62.81);Difficulty in walking, not elsewhere classified (R26.2);Pain;Unsteadiness on feet (R26.81)     Time: [id number] PT Time Calculation (min) (ACUTE ONLY): 47 min  Charges:  $Therapeutic Exercise: 8-22 mins $Therapeutic Activity: 8-22 mins                    Mikel Cella, PTA    [name] 10/02/2022, 1:32 PM

## 2022-10-02 NOTE — Progress Notes (Signed)
PROGRESS NOTE    Evan [name]   [name] DOB: 04-May-1956  DOA: 09/26/2022 Date of Service: 10/02/22 PCP: [name], FNP     Brief Narrative / Hospital Course:  [name] is a 67 year old male with history of hyperlipidemia, paranoid schizophrenia, CAD, non-insulin-dependent diabetes mellitus, hypertension, obesity, diabetic retinopathy, who presents emergency department for chief concerns of dizziness from [facility] via EMS. 02/20: Cefepime and vancomycin. Admitted to hospitalist service for sepsis unknown source. 02/21: hypotensive --> pt transferred to stepdown for norepi gtt.  02/22: BP improving, off norepi approx 10:00 am, scheduled midodrine and stress dose steroids (Cortisol level 20).  BCx NGx2d, GI PCR and CDiff pending, repeat BNP higher at 988, lactic and WBC coming down. Echo LVEF 50-55% w/ low normal fxn, mild LVH, indeterminate diastolic, moderate hypokinesis inferolateral LV, moderate aortic valve stenosis. Net IO Since Admission: 3,207.89 mL [09/28/22 1440] 02/23: BP remains stable but soft. Neurology eval - Possible extrapyramidal symptoms from antipsychotics. EEG no concerns. Discontinued Haldol 1 mg at bedtime and increased his Risperdal 0.5 mg BID to 1 mg BID per psychiatry.  02/24: BP remains stable. WBC coming down now 11.8.  02/25: MRI brain no concerns, outpatient neurological follow up 8-12 wk. Psychaitry team recs continue Risperdal 1 mg bid, Ativan 0.5 mg q6h prn anxiety. Today is Sunday, TOC to work on placement. 02/26:  BP remains soft but has improved and has been stable. Other VSS, labs stable.     Consultants:  Neurology Psychiatry   Procedures: none      ASSESSMENT & PLAN:   Principal Problem:   SIRS (systemic inflammatory response syndrome) (HCC) Active Problems:   Hypertension associated with diabetes ([place])   Esophageal dysmotility   Chronic diastolic CHF (congestive heart failure) (HCC)   Paranoid  schizophrenia (HCC)   Type 2 diabetes mellitus with morbid obesity (HCC)   Obstructive sleep apnea on CPAP   Hyperlipidemia associated with type 2 diabetes mellitus (HCC)   Persistent atrial fibrillation (HCC)   CAD (coronary artery disease)   Hoarding disorder   COPD, mild (HCC)   Atherosclerosis of aorta (HCC)   History of CVA (cerebrovascular accident)   Undifferentiated schizophrenia ([place])   Leukocytosis   AKI (acute kidney injury) ([place])   Shock circulatory (HCC)   Elevated lactic acid level   Left ventricular hypokinesis inferolateral wall   Moderate aortic stenosis   Hypotension - improved  SIRS (systemic inflammatory response syndrome) (HCC) / Severe sepsis unknown source Altered mental status - improved  Uncertain etiology Follow BCx --> NG TSH WNL  Continue with cefepime and vancomycin per pharmacy, today is 7 days abx and can d/c after this Continue scheduled midodrine - trial reducing dose 10 to 5  stress dose steroids initiated by ICU team, these have been d/c  Left ventricular hypokinesis inferolateral wall Moderate aortic stenosis cardiology outpatient  Consider low dose Lasix if BP tolerates  Strict I&O  Tremor, question parkinsonism (primary vs med effect) Pt reprots numbness lower extremities and "can't move" but then able to move somewhat, question neuro sensory/motor deficit Possible extrapyramidal symptoms from antipsychotics  EEG and TSH WNL, MRI no acute findings, B12, ammonia WNL Discontinued Haldol 1 mg at bedtime and increased his Risperdal 0.5 mg BID to 1 mg BID per psychiatry Neurology to follow outpatient in 8-12 weeks  AKI (acute kidney injury) ([place]) - resolved Monitor BMP   Leukocytosis - improved Monitor CBC   History of CVA (cerebrovascular accident) aspirin 81 mg daily simvastatin  20 mg nightly  Essential Hypertension Holding home metoprolol succinate 50 mg daily at this time due to lower BP   COPD, mild (HCC) Not in acute  exacerbation at this time Albuterol nebulizer, every 6 hours as needed for wheezing and shortness of breath, 5 days ordered   Hyperlipidemia associated with type 2 diabetes mellitus (HCC) Simvastatin 20 mg nightly resumed   Paranoid schizophrenia ([place]) - stable  Psychiatry consulted  Concern for haldol causing extrapyramidal symptoms  Discontinued Haldol 1 mg at bedtime  Continue Risperdal at increased dose of 1 mg BID per psychiatry   History Atrial Fibrillation Continue Xarelto  Beta blocker on hold d/t hypotension    DVT prophylaxis: Xarelto Pertinent IV fluids/nutrition: no conitnuous IV fluids  Central lines / invasive devices: none  Code Status: FULL CODE   Current Admission Status: inpatient  TOC needs / Dispo plan: SNF placement Barriers to discharge / significant pending items: TOC to work on placement, patient is medically stable today              Subjective / Brief ROS:  Patient has no concerns today     Family Communication: none at this time, will reach out to his guardian as needed - TOC to communicate w/ DSS re: placement     Objective Findings:  Vitals:   10/02/22 0338 10/02/22 0824 10/02/22 0901 10/02/22 1125  BP: 128/74 117/62  (!) 112/59  Pulse: (!) 103 84  94  Resp: '17 16  17  '$ Temp: 98.2 F (36.8 C) 97.9 F (36.6 C)  98.5 F (36.9 C)  TempSrc: Oral   Oral  SpO2: 95% 97%  98%  Weight:   86.4 kg   Height:        Intake/Output Summary (Last 24 hours) at 10/02/2022 1317 Last data filed at 10/02/2022 0900 Gross per 24 hour  Intake 600 ml  Output 2100 ml  Net -1500 ml   Filed Weights   09/30/22 0429 10/01/22 0422 10/02/22 0901  Weight: 92.1 kg 91.5 kg 86.4 kg    Examination:  Physical Exam Constitutional:      General: He is not in acute distress.    Appearance: Normal appearance. He is not toxic-appearing.  HENT:     Left Ear: External ear normal.  Eyes:     Extraocular Movements: Extraocular movements intact.   Cardiovascular:     Rate and Rhythm: Normal rate and regular rhythm.     Heart sounds: Murmur heard.  Pulmonary:     Effort: No respiratory distress.     Breath sounds: No rales.  Abdominal:     General: Abdomen is flat.     Palpations: Abdomen is soft.     Tenderness: There is no abdominal tenderness.  Musculoskeletal:     Right lower leg: No edema.     Left lower leg: No edema.  Skin:    General: Skin is warm and dry.  Neurological:     General: No focal deficit present.     Mental Status: He is alert. He is disoriented.     Cranial Nerves: No cranial nerve deficit.     Sensory: No sensory deficit.     Motor: Weakness present.  Psychiatric:        Mood and Affect: Mood normal.        Behavior: Behavior normal.          Scheduled Medications:   aspirin EC  81 mg Oral Daily   Chlorhexidine Gluconate Cloth  6 each Topical Daily   feeding supplement  237 mL Oral BID BM   insulin aspart  0-5 Units Subcutaneous QHS   insulin aspart  0-9 Units Subcutaneous TID WC   midodrine  5 mg Oral TID WC   mometasone-formoterol  2 puff Inhalation BID   multivitamin with minerals  1 tablet Oral Daily   pantoprazole  40 mg Oral QAC breakfast   risperiDONE  1 mg Oral BID   rivaroxaban  20 mg Oral Q supper   senna  2 tablet Oral QHS   simvastatin  20 mg Oral QHS    Continuous Infusions:  sodium chloride Stopped (09/27/22 1211)    PRN Medications:  acetaminophen **OR** acetaminophen, LORazepam, melatonin, menthol-cetylpyridinium, nitroGLYCERIN, nystatin cream, ondansetron **OR** ondansetron (ZOFRAN) IV, senna-docusate  Antimicrobials from admission:  Anti-infectives (From admission, onward)    Start     Dose/Rate Route Frequency Ordered Stop   09/29/22 1000  cefTRIAXone (ROCEPHIN) 2 g in sodium chloride 0.9 % 100 mL IVPB        2 g 200 mL/hr over 30 Minutes Intravenous Every 24 hours 09/28/22 1110 10/01/22 0843   09/27/22 1100  vancomycin (VANCOCIN) IVPB 1000 mg/200 mL  premix  Status:  Discontinued        1,000 mg 200 mL/hr over 60 Minutes Intravenous Every 12 hours 09/27/22 1025 09/28/22 1108   09/27/22 1015  ceFEPIme (MAXIPIME) 2 g in sodium chloride 0.9 % 100 mL IVPB        2 g 200 mL/hr over 30 Minutes Intravenous Every 8 hours 09/27/22 1012 09/28/22 2230   09/27/22 0030  ceFEPIme (MAXIPIME) 2 g in sodium chloride 0.9 % 100 mL IVPB  Status:  Discontinued        2 g 200 mL/hr over 30 Minutes Intravenous Every 12 hours 09/26/22 1647 09/27/22 1012   09/26/22 1630  vancomycin (VANCOREADY) IVPB 750 mg/150 mL        750 mg 150 mL/hr over 60 Minutes Intravenous  Once 09/26/22 1546 09/26/22 1931   09/26/22 1230  ceFEPIme (MAXIPIME) 2 g in sodium chloride 0.9 % 100 mL IVPB        2 g 200 mL/hr over 30 Minutes Intravenous  Once 09/26/22 1220 09/26/22 1302   09/26/22 1230  vancomycin (VANCOCIN) IVPB 1000 mg/200 mL premix        1,000 mg 200 mL/hr over 60 Minutes Intravenous  Once 09/26/22 1220 09/26/22 1410           Data Reviewed:  I have personally reviewed the following...  CBC: Recent Labs  Lab 09/27/22 0529 09/28/22 0620 09/29/22 0546 09/30/22 0445 10/02/22 0549  WBC 13.2* 13.0* 13.2* 11.8* 11.4*  NEUTROABS  --   --  11.8*  --   --   HGB 11.7* 11.7* 10.8* 11.6* 10.8*  HCT 36.8* 37.1* 33.7* 36.0* 33.9*  MCV 90.4 90.7 88.2 89.3 89.7  PLT 393 396 378 391 [id number]   Basic Metabolic Panel: Recent Labs  Lab 09/27/22 0529 09/27/22 1937 09/28/22 0620 09/29/22 0546 09/30/22 0445 10/02/22 0549  NA 136  --  137 135 135 136  K 3.4* 3.3* 4.0 3.6 3.4* 4.0  CL 106  --  108 106 106 106  CO2 20*  --  18* '22 22 25  '$ GLUCOSE 120*  --  124* 159* 113* 121*  BUN 24*  --  '15 21 22 17  '$ CREATININE 0.86  --  0.77 0.72 0.72 0.70  CALCIUM 8.3*  --  8.4*  8.3* 8.5* 8.3*  MG  --  2.1 2.1 2.2  --   --   PHOS  --  2.9 3.0 2.6  --   --    GFR: Estimated Creatinine Clearance: 89.4 mL/min (by C-G formula based on SCr of 0.7 mg/dL). Liver Function  Tests: Recent Labs  Lab 09/27/22 1449 09/28/22 0620  AST 78* 59*  ALT 31 30  ALKPHOS 72 73  BILITOT 0.9 1.2  PROT 5.2* 5.4*  ALBUMIN 2.3* 2.3*   No results for input(s): "LIPASE", "AMYLASE" in the last 168 hours. Recent Labs  Lab 09/30/22 0445  AMMONIA 15   Coagulation Profile: No results for input(s): "INR", "PROTIME" in the last 168 hours. Cardiac Enzymes: No results for input(s): "CKTOTAL", "CKMB", "CKMBINDEX", "TROPONINI" in the last 168 hours. BNP (last 3 results) No results for input(s): "PROBNP" in the last 8760 hours. HbA1C: No results for input(s): "HGBA1C" in the last 72 hours.  CBG: Recent Labs  Lab 10/01/22 1220 10/01/22 1603 10/01/22 2039 10/02/22 0844 10/02/22 1126  GLUCAP 148* 130* 113* 106* 189*   Lipid Profile: No results for input(s): "CHOL", "HDL", "LDLCALC", "TRIG", "CHOLHDL", "LDLDIRECT" in the last 72 hours. Thyroid Function Tests: Recent Labs    09/30/22 0445  TSH 2.036   Anemia Panel: Recent Labs    09/30/22 0445  VITAMINB12 501   Most Recent Urinalysis On File:     Component Value Date/Time   COLORURINE YELLOW (A) 09/26/2022 1117   APPEARANCEUR HAZY (A) 09/26/2022 1117   APPEARANCEUR Clear 09/20/2021 0856   LABSPEC 1.013 09/26/2022 1117   LABSPEC 1.013 11/29/2014 1120   PHURINE 5.0 09/26/2022 1117   GLUCOSEU >=500 (A) 09/26/2022 1117   GLUCOSEU Negative 11/29/2014 1120   HGBUR LARGE (A) 09/26/2022 1117   BILIRUBINUR NEGATIVE 09/26/2022 1117   BILIRUBINUR Negative 09/20/2021 0856   BILIRUBINUR Negative 11/29/2014 1120   KETONESUR NEGATIVE 09/26/2022 1117   PROTEINUR 30 (A) 09/26/2022 1117   NITRITE NEGATIVE 09/26/2022 1117   LEUKOCYTESUR NEGATIVE 09/26/2022 1117   LEUKOCYTESUR Negative 11/29/2014 1120   Sepsis Labs: '@LABRCNTIP'$ (procalcitonin:4,lacticidven:4) Microbiology: Recent Results (from the past 240 hour(s))  Resp panel by RT-PCR (RSV, Flu A&B, Covid) Anterior Nasal Swab     Status: None   Collection Time:  09/26/22 10:03 AM   Specimen: Anterior Nasal Swab  Result Value Ref Range Status   SARS Coronavirus 2 by RT PCR NEGATIVE NEGATIVE Final    Comment: (NOTE) SARS-CoV-2 target nucleic acids are NOT DETECTED.  The SARS-CoV-2 RNA is generally detectable in upper respiratory specimens during the acute phase of infection. The lowest concentration of SARS-CoV-2 viral copies this assay can detect is 138 copies/mL. A negative result does not preclude SARS-Cov-2 infection and should not be used as the sole basis for treatment or other patient management decisions. A negative result may occur with  improper specimen collection/handling, submission of specimen other than nasopharyngeal swab, presence of viral mutation(s) within the areas targeted by this assay, and inadequate number of viral copies(<138 copies/mL). A negative result must be combined with clinical observations, patient history, and epidemiological information. The expected result is Negative.  Fact Sheet for Patients:  EntrepreneurPulse.com.au  Fact Sheet for Healthcare Providers:  IncredibleEmployment.be  This test is no t yet approved or cleared by the [place] FDA and  has been authorized for detection and/or diagnosis of SARS-CoV-2 by FDA under an Emergency Use Authorization (EUA). This EUA will remain  in effect (meaning this test can be used) for the duration of  the COVID-19 declaration under Section 564(b)(1) of the Act, 21 U.S.C.section 360bbb-3(b)(1), unless the authorization is terminated  or revoked sooner.       Influenza A by PCR NEGATIVE NEGATIVE Final   Influenza B by PCR NEGATIVE NEGATIVE Final    Comment: (NOTE) The Xpert Xpress SARS-CoV-2/FLU/RSV plus assay is intended as an aid in the diagnosis of influenza from Nasopharyngeal swab specimens and should not be used as a sole basis for treatment. Nasal washings and aspirates are unacceptable for Xpert Xpress  SARS-CoV-2/FLU/RSV testing.  Fact Sheet for Patients: EntrepreneurPulse.com.au  Fact Sheet for Healthcare Providers: IncredibleEmployment.be  This test is not yet approved or cleared by the [place] FDA and has been authorized for detection and/or diagnosis of SARS-CoV-2 by FDA under an Emergency Use Authorization (EUA). This EUA will remain in effect (meaning this test can be used) for the duration of the COVID-19 declaration under Section 564(b)(1) of the Act, 21 U.S.C. section 360bbb-3(b)(1), unless the authorization is terminated or revoked.     Resp Syncytial Virus by PCR NEGATIVE NEGATIVE Final    Comment: (NOTE) Fact Sheet for Patients: EntrepreneurPulse.com.au  Fact Sheet for Healthcare Providers: IncredibleEmployment.be  This test is not yet approved or cleared by the [place] FDA and has been authorized for detection and/or diagnosis of SARS-CoV-2 by FDA under an Emergency Use Authorization (EUA). This EUA will remain in effect (meaning this test can be used) for the duration of the COVID-19 declaration under Section 564(b)(1) of the Act, 21 U.S.C. section 360bbb-3(b)(1), unless the authorization is terminated or revoked.  Performed at [facility], [address] [place], [place] [address]   Blood Culture (routine x 2)     Status: None   Collection Time: 09/26/22 11:17 AM   Specimen: BLOOD RIGHT ARM  Result Value Ref Range Status   Specimen Description BLOOD RIGHT ARM  Final   Special Requests   Final    BOTTLES DRAWN AEROBIC AND ANAEROBIC Blood Culture results may not be optimal due to an excessive volume of blood received in culture bottles   Culture   Final    NO GROWTH 5 DAYS Performed at [facility], [address] [place], [place] [address]    Report Status 10/01/2022 FINAL  Final  Blood Culture (routine x 2)     Status: None   Collection Time:  09/26/22 11:17 AM   Specimen: BLOOD LEFT ARM  Result Value Ref Range Status   Specimen Description BLOOD LEFT ARM  Final   Special Requests   Final    BOTTLES DRAWN AEROBIC AND ANAEROBIC Blood Culture results may not be optimal due to an excessive volume of blood received in culture bottles   Culture   Final    NO GROWTH 5 DAYS Performed at [facility], [address] [place], [place] [address]    Report Status 10/01/2022 FINAL  Final  MRSA Next Gen by PCR, Nasal     Status: None   Collection Time: 09/27/22 12:08 PM   Specimen: Nasal Mucosa; Nasal Swab  Result Value Ref Range Status   MRSA by PCR Next Gen NOT DETECTED NOT DETECTED Final    Comment: (NOTE) The GeneXpert MRSA Assay (FDA approved for NASAL specimens only), is one component of a comprehensive MRSA colonization surveillance program. It is not intended to diagnose MRSA infection nor to guide or monitor treatment for MRSA infections. Test performance is not FDA approved in patients less than 32 years old. Performed at  [facility], [address], [place], [place] [address]       Radiology Studies last 3 days: MR BRAIN WO CONTRAST  Result Date: 09/30/2022 CLINICAL DATA:  Delirium EXAM: MRI HEAD WITHOUT CONTRAST TECHNIQUE: Multiplanar, multiecho pulse sequences of the brain and surrounding structures were obtained without intravenous contrast. COMPARISON:  11/06/2020 FINDINGS: Brain: No acute infarct, mass effect or extra-axial collection. No chronic microhemorrhage or siderosis. There is multifocal hyperintense T2-weighted signal within the white matter. Generalized volume loss. Old right cerebellar and right thalamic small vessel infarcts. The midline structures are normal. Vascular: Normal flow voids. Skull and upper cervical spine: Normal marrow signal. Sinuses/Orbits: Negative. Other: None. IMPRESSION: 1. No acute intracranial abnormality. 2. Old right cerebellar and right thalamic small vessel  infarct and findings of chronic small vessel ischemia. Electronically Signed   By: Ulyses Jarred M.D.   On: 09/30/2022 21:07   EEG adult  Result Date: 09/29/2022 Lora Havens, MD     09/29/2022  7:43 PM Patient Name: AMIRI LANNIGAN MRN: [id number] Epilepsy Attending: Lora Havens Referring Physician/Provider: Amie Portland, MD Date: 09/29/2022 Duration: 24.48 mins Patient history: 67yo M with ams. EEG to evaluate for seizure Level of alertness: Awake AEDs during EEG study: None Technical aspects: This EEG study was done with scalp electrodes positioned according to the 10-20 International system of electrode placement. Electrical activity was reviewed with band pass filter of 1-'70Hz'$ , sensitivity of 7 uV/mm, display speed of 53m/sec with a '60Hz'$  notched filter applied as appropriate. EEG data were recorded continuously and digitally stored.  Video monitoring was available and reviewed as appropriate. Description: No clear posterior dominant rhythm was seen. EEG showed continuous generalized predominantly 5-'7Hz'$  Hz theta slowing admixed with intermittent generalized rhythmic 2-'3Hz'$  delta slowing. Hyperventilation and photic stimulation were not performed.   ABNORMALITY - Continuous slow, generalized IMPRESSION: This study is suggestive of moderate diffuse encephalopathy, nonspecific etiology. No seizures or epileptiform discharges were seen throughout the recording. [place]            LOS: 6 days      NEmeterio Reeve DO [facility] 10/02/2022, 1:17 PM    Dictation software may have been used to generate the above note. Typos may occur and escape review in typed/dictated notes. Please contact Dr ASheppard Coildirectly for clarity if needed.  Staff may message me via secure chat in [place] but this may not receive an immediate response,  please page me for urgent matters!  If 7PM-7AM, please contact night coverage [email or web address]

## 2022-10-03 ENCOUNTER — Inpatient Hospital Stay: Payer: 59

## 2022-10-03 DIAGNOSIS — F203 Undifferentiated schizophrenia: Secondary | ICD-10-CM | POA: Diagnosis not present

## 2022-10-03 LAB — GLUCOSE, CAPILLARY
Glucose-Capillary: 117 mg/dL — ABNORMAL HIGH (ref 70–99)
Glucose-Capillary: 169 mg/dL — ABNORMAL HIGH (ref 70–99)
Glucose-Capillary: 110 mg/dL — ABNORMAL HIGH (ref 70–99)
Glucose-Capillary: 122 mg/dL — ABNORMAL HIGH (ref 70–99)

## 2022-10-03 NOTE — Progress Notes (Signed)
OT Cancellation Note  Patient Details Name: Evan [name] MRN: [id number] DOB: 1955/12/31   Cancelled Treatment:    Reason Eval/Treat Not Completed: Fatigue/lethargy limiting ability to participate. Pt asleep in bed upon arrival. Woke with Max VC. Pt responding minimally to OT and keeping eyes closed entire time. When attempting to encourage pt to sit at EOB for ADLs/exercises, pt repeatedly stating "I can't". Pt deferred further attempts to encourage OOB mobility. Will re-attempt at later date/time.  Doneta Public 10/03/2022, 4:50 PM

## 2022-10-03 NOTE — Progress Notes (Signed)
PROGRESS NOTE    Evan [name]   [name] DOB: 1956-07-08  DOA: 09/26/2022 Date of Service: 10/03/22 PCP: [name], FNP     Brief Narrative / Hospital Course:  [name] is a 67 year old male with history of hyperlipidemia, paranoid schizophrenia, CAD, non-insulin-dependent diabetes mellitus, hypertension, obesity, diabetic retinopathy, who presents emergency department for chief concerns of dizziness from [facility] via EMS. 02/20: Cefepime and vancomycin. Admitted to hospitalist service for sepsis unknown source. 02/21: hypotensive --> pt transferred to stepdown for norepi gtt.  02/22: BP improving, off norepi approx 10:00 am, scheduled midodrine and stress dose steroids (Cortisol level 20).  BCx NGx2d, GI PCR and CDiff pending, repeat BNP higher at 988, lactic and WBC coming down. Echo LVEF 50-55% w/ low normal fxn, mild LVH, indeterminate diastolic, moderate hypokinesis inferolateral LV, moderate aortic valve stenosis. Net IO Since Admission: 3,207.89 mL [09/28/22 1440] 02/23: BP remains stable but soft. Neurology eval - Possible extrapyramidal symptoms from antipsychotics. EEG no concerns. Discontinued Haldol 1 mg at bedtime and increased his Risperdal 0.5 mg BID to 1 mg BID per psychiatry.  02/24: BP remains stable. WBC coming down now 11.8.  02/25: MRI brain no concerns, outpatient neurological follow up 8-12 wk. Psychaitry team recs continue Risperdal 1 mg bid, Ativan 0.5 mg q6h prn anxiety. Today is Sunday, TOC to work on placement. 02/26:  BP remains soft but has improved and has been stable. Other VSS, labs stable.  02/27: remains stable. Persistent lower extremity weakness complaints, medically appropriate for discharge as long as no findings on MRI lumbar spine     Consultants:  Neurology Psychiatry   Procedures: none      ASSESSMENT & PLAN:   Principal Problem:   SIRS (systemic inflammatory response syndrome) ([place]) Active Problems:    Hypertension associated with diabetes ([place])   Esophageal dysmotility   Chronic diastolic CHF (congestive heart failure) (HCC)   Paranoid schizophrenia ([place])   Type 2 diabetes mellitus with morbid obesity (HCC)   Obstructive sleep apnea on CPAP   Hyperlipidemia associated with type 2 diabetes mellitus (HCC)   Persistent atrial fibrillation (HCC)   CAD (coronary artery disease)   Hoarding disorder   COPD, mild (HCC)   Atherosclerosis of aorta (HCC)   History of CVA (cerebrovascular accident)   Undifferentiated schizophrenia ([place])   Leukocytosis   AKI (acute kidney injury) ([place])   Shock circulatory (HCC)   Elevated lactic acid level   Left ventricular hypokinesis inferolateral wall   Moderate aortic stenosis   Hypotension - improved  SIRS (systemic inflammatory response syndrome) ([place]) / Severe sepsis unknown source Altered mental status - improved  Uncertain etiology Follow BCx --> NG TSH WNL  Continue with cefepime and vancomycin per pharmacy, today is 7 days abx and can d/c after this Continue scheduled midodrine - trial reducing dose 10 to 5  stress dose steroids initiated by ICU team, these have been d/c  Left ventricular hypokinesis inferolateral wall Moderate aortic stenosis cardiology outpatient  Consider low dose Lasix if BP tolerates  Strict I&O  Tremor, question parkinsonism (primary vs med effect) Pt reprots numbness lower extremities and "can't move" but then able to move somewhat, question neuro sensory/motor deficit Possible extrapyramidal symptoms from antipsychotics  EEG and TSH WNL, MRI no acute findings, B12, ammonia WNL Discontinued Haldol 1 mg at bedtime and increased his Risperdal 0.5 mg BID to 1 mg BID per psychiatry Neurology to follow outpatient in 8-12 weeks Pt persistently complaining of leg numbness, will  get MRI lumbar spine 10/03/22, MRI brain was normal   AKI (acute kidney injury) ([place]) - resolved Monitor BMP   Leukocytosis -  improved Monitor CBC   History of CVA (cerebrovascular accident) aspirin 81 mg daily simvastatin 20 mg nightly  Essential Hypertension Holding home metoprolol succinate 50 mg daily at this time due to lower BP   COPD, mild (HCC) Not in acute exacerbation at this time Albuterol nebulizer, every 6 hours as needed for wheezing and shortness of breath, 5 days ordered   Hyperlipidemia associated with type 2 diabetes mellitus (HCC) Simvastatin 20 mg nightly resumed   Paranoid schizophrenia ([place]) - stable  Psychiatry following   Concern for haldol causing extrapyramidal symptoms  Discontinued Haldol 1 mg at bedtime  Continue Risperdal at increased dose of 1 mg BID per psychiatry   History Atrial Fibrillation Continue Xarelto  Beta blocker on hold d/t hypotension    DVT prophylaxis: Xarelto Pertinent IV fluids/nutrition: no conitnuous IV fluids  Central lines / invasive devices: none  Code Status: FULL CODE   Current Admission Status: inpatient  TOC needs / Dispo plan: SNF placement Barriers to discharge / significant pending items: TOC to work on placement, patient is medically stable today              Subjective / Brief ROS:  Patient has no concerns today     Family Communication: none at this time, will reach out to his guardian as needed - TOC to communicate w/ DSS re: placement     Objective Findings:  Vitals:   10/03/22 0409 10/03/22 0841 10/03/22 0900 10/03/22 1230  BP: (!) 109/57  122/79 104/75  Pulse: 80  90 95  Resp: '20  20 19  '$ Temp: 98.6 F (37 C)  98.7 F (37.1 C) 98.1 F (36.7 C)  TempSrc:   Oral   SpO2: 96%  95% 98%  Weight:  88.1 kg    Height:        Intake/Output Summary (Last 24 hours) at 10/03/2022 1416 Last data filed at 10/03/2022 1338 Gross per 24 hour  Intake 777 ml  Output 1450 ml  Net -673 ml    Filed Weights   10/01/22 0422 10/02/22 0901 10/03/22 0841  Weight: 91.5 kg 86.4 kg 88.1 kg    Examination:  Physical  Exam Constitutional:      General: He is not in acute distress.    Appearance: Normal appearance. He is not toxic-appearing.  HENT:     Left Ear: External ear normal.  Eyes:     Extraocular Movements: Extraocular movements intact.  Cardiovascular:     Rate and Rhythm: Normal rate and regular rhythm.     Heart sounds: Murmur heard.  Pulmonary:     Effort: No respiratory distress.     Breath sounds: No rales.  Abdominal:     General: Abdomen is flat.     Palpations: Abdomen is soft.     Tenderness: There is no abdominal tenderness.  Musculoskeletal:     Right lower leg: No edema.     Left lower leg: No edema.  Skin:    General: Skin is warm and dry.  Neurological:     General: No focal deficit present.     Mental Status: He is alert. He is disoriented.     Cranial Nerves: No cranial nerve deficit.     Sensory: No sensory deficit.     Motor: Weakness present.  Psychiatric:        Mood  and Affect: Mood normal.        Behavior: Behavior normal.          Scheduled Medications:   aspirin EC  81 mg Oral Daily   Chlorhexidine Gluconate Cloth  6 each Topical Daily   feeding supplement  237 mL Oral BID BM   insulin aspart  0-5 Units Subcutaneous QHS   insulin aspart  0-9 Units Subcutaneous TID WC   midodrine  5 mg Oral TID WC   mometasone-formoterol  2 puff Inhalation BID   multivitamin with minerals  1 tablet Oral Daily   pantoprazole  40 mg Oral QAC breakfast   risperiDONE  1 mg Oral BID   rivaroxaban  20 mg Oral Q supper   senna  2 tablet Oral QHS   simvastatin  20 mg Oral QHS    Continuous Infusions:  sodium chloride Stopped (09/27/22 1211)    PRN Medications:  acetaminophen **OR** acetaminophen, LORazepam, melatonin, menthol-cetylpyridinium, nitroGLYCERIN, nystatin cream, ondansetron **OR** ondansetron (ZOFRAN) IV, senna-docusate  Antimicrobials from admission:  Anti-infectives (From admission, onward)    Start     Dose/Rate Route Frequency Ordered Stop    09/29/22 1000  cefTRIAXone (ROCEPHIN) 2 g in sodium chloride 0.9 % 100 mL IVPB        2 g 200 mL/hr over 30 Minutes Intravenous Every 24 hours 09/28/22 1110 10/01/22 0843   09/27/22 1100  vancomycin (VANCOCIN) IVPB 1000 mg/200 mL premix  Status:  Discontinued        1,000 mg 200 mL/hr over 60 Minutes Intravenous Every 12 hours 09/27/22 1025 09/28/22 1108   09/27/22 1015  ceFEPIme (MAXIPIME) 2 g in sodium chloride 0.9 % 100 mL IVPB        2 g 200 mL/hr over 30 Minutes Intravenous Every 8 hours 09/27/22 1012 09/28/22 2230   09/27/22 0030  ceFEPIme (MAXIPIME) 2 g in sodium chloride 0.9 % 100 mL IVPB  Status:  Discontinued        2 g 200 mL/hr over 30 Minutes Intravenous Every 12 hours 09/26/22 1647 09/27/22 1012   09/26/22 1630  vancomycin (VANCOREADY) IVPB 750 mg/150 mL        750 mg 150 mL/hr over 60 Minutes Intravenous  Once 09/26/22 1546 09/26/22 1931   09/26/22 1230  ceFEPIme (MAXIPIME) 2 g in sodium chloride 0.9 % 100 mL IVPB        2 g 200 mL/hr over 30 Minutes Intravenous  Once 09/26/22 1220 09/26/22 1302   09/26/22 1230  vancomycin (VANCOCIN) IVPB 1000 mg/200 mL premix        1,000 mg 200 mL/hr over 60 Minutes Intravenous  Once 09/26/22 1220 09/26/22 1410           Data Reviewed:  I have personally reviewed the following...  CBC: Recent Labs  Lab 09/27/22 0529 09/28/22 0620 09/29/22 0546 09/30/22 0445 10/02/22 0549  WBC 13.2* 13.0* 13.2* 11.8* 11.4*  NEUTROABS  --   --  11.8*  --   --   HGB 11.7* 11.7* 10.8* 11.6* 10.8*  HCT 36.8* 37.1* 33.7* 36.0* 33.9*  MCV 90.4 90.7 88.2 89.3 89.7  PLT 393 396 378 391 [id number]    Basic Metabolic Panel: Recent Labs  Lab 09/27/22 0529 09/27/22 1937 09/28/22 0620 09/29/22 0546 09/30/22 0445 10/02/22 0549  NA 136  --  137 135 135 136  K 3.4* 3.3* 4.0 3.6 3.4* 4.0  CL 106  --  108 106 106 106  CO2 20*  --  18* '22 22 25  '$ GLUCOSE 120*  --  124* 159* 113* 121*  BUN 24*  --  '15 21 22 17  '$ CREATININE 0.86  --  0.77 0.72 0.72  0.70  CALCIUM 8.3*  --  8.4* 8.3* 8.5* 8.3*  MG  --  2.1 2.1 2.2  --   --   PHOS  --  2.9 3.0 2.6  --   --     GFR: Estimated Creatinine Clearance: 90.1 mL/min (by C-G formula based on SCr of 0.7 mg/dL). Liver Function Tests: Recent Labs  Lab 09/27/22 1449 09/28/22 0620  AST 78* 59*  ALT 31 30  ALKPHOS 72 73  BILITOT 0.9 1.2  PROT 5.2* 5.4*  ALBUMIN 2.3* 2.3*    No results for input(s): "LIPASE", "AMYLASE" in the last 168 hours. Recent Labs  Lab 09/30/22 0445  AMMONIA 15    Coagulation Profile: No results for input(s): "INR", "PROTIME" in the last 168 hours. Cardiac Enzymes: No results for input(s): "CKTOTAL", "CKMB", "CKMBINDEX", "TROPONINI" in the last 168 hours. BNP (last 3 results) No results for input(s): "PROBNP" in the last 8760 hours. HbA1C: No results for input(s): "HGBA1C" in the last 72 hours.  CBG: Recent Labs  Lab 10/02/22 1126 10/02/22 1658 10/02/22 2020 10/03/22 0828 10/03/22 1229  GLUCAP 189* 102* 163* 117* 169*    Lipid Profile: No results for input(s): "CHOL", "HDL", "LDLCALC", "TRIG", "CHOLHDL", "LDLDIRECT" in the last 72 hours. Thyroid Function Tests: No results for input(s): "TSH", "T4TOTAL", "FREET4", "T3FREE", "THYROIDAB" in the last 72 hours.  Anemia Panel: No results for input(s): "VITAMINB12", "FOLATE", "FERRITIN", "TIBC", "IRON", "RETICCTPCT" in the last 72 hours.  Most Recent Urinalysis On File:     Component Value Date/Time   COLORURINE YELLOW (A) 09/26/2022 1117   APPEARANCEUR HAZY (A) 09/26/2022 1117   APPEARANCEUR Clear 09/20/2021 0856   LABSPEC 1.013 09/26/2022 1117   LABSPEC 1.013 11/29/2014 1120   PHURINE 5.0 09/26/2022 1117   GLUCOSEU >=500 (A) 09/26/2022 1117   GLUCOSEU Negative 11/29/2014 1120   HGBUR LARGE (A) 09/26/2022 1117   BILIRUBINUR NEGATIVE 09/26/2022 1117   BILIRUBINUR Negative 09/20/2021 0856   BILIRUBINUR Negative 11/29/2014 1120   KETONESUR NEGATIVE 09/26/2022 1117   PROTEINUR 30 (A) 09/26/2022  1117   NITRITE NEGATIVE 09/26/2022 1117   LEUKOCYTESUR NEGATIVE 09/26/2022 1117   LEUKOCYTESUR Negative 11/29/2014 1120   Sepsis Labs: '@LABRCNTIP'$ (procalcitonin:4,lacticidven:4) Microbiology: Recent Results (from the past 240 hour(s))  Resp panel by RT-PCR (RSV, Flu A&B, Covid) Anterior Nasal Swab     Status: None   Collection Time: 09/26/22 10:03 AM   Specimen: Anterior Nasal Swab  Result Value Ref Range Status   SARS Coronavirus 2 by RT PCR NEGATIVE NEGATIVE Final    Comment: (NOTE) SARS-CoV-2 target nucleic acids are NOT DETECTED.  The SARS-CoV-2 RNA is generally detectable in upper respiratory specimens during the acute phase of infection. The lowest concentration of SARS-CoV-2 viral copies this assay can detect is 138 copies/mL. A negative result does not preclude SARS-Cov-2 infection and should not be used as the sole basis for treatment or other patient management decisions. A negative result may occur with  improper specimen collection/handling, submission of specimen other than nasopharyngeal swab, presence of viral mutation(s) within the areas targeted by this assay, and inadequate number of viral copies(<138 copies/mL). A negative result must be combined with clinical observations, patient history, and epidemiological information. The expected result is Negative.  Fact Sheet for Patients:  EntrepreneurPulse.com.au  Fact Sheet for Healthcare Providers:  IncredibleEmployment.be  This test is no t yet approved or cleared by the [place] and  has been authorized for detection and/or diagnosis of SARS-CoV-2 by FDA under an Emergency Use Authorization (EUA). This EUA will remain  in effect (meaning this test can be used) for the duration of the COVID-19 declaration under Section 564(b)(1) of the Act, 21 U.S.C.section 360bbb-3(b)(1), unless the authorization is terminated  or revoked sooner.       Influenza A by PCR NEGATIVE  NEGATIVE Final   Influenza B by PCR NEGATIVE NEGATIVE Final    Comment: (NOTE) The Xpert Xpress SARS-CoV-2/FLU/RSV plus assay is intended as an aid in the diagnosis of influenza from Nasopharyngeal swab specimens and should not be used as a sole basis for treatment. Nasal washings and aspirates are unacceptable for Xpert Xpress SARS-CoV-2/FLU/RSV testing.  Fact Sheet for Patients: EntrepreneurPulse.com.au  Fact Sheet for Healthcare Providers: IncredibleEmployment.be  This test is not yet approved or cleared by the [place] FDA and has been authorized for detection and/or diagnosis of SARS-CoV-2 by FDA under an Emergency Use Authorization (EUA). This EUA will remain in effect (meaning this test can be used) for the duration of the COVID-19 declaration under Section 564(b)(1) of the Act, 21 U.S.C. section 360bbb-3(b)(1), unless the authorization is terminated or revoked.     Resp Syncytial Virus by PCR NEGATIVE NEGATIVE Final    Comment: (NOTE) Fact Sheet for Patients: EntrepreneurPulse.com.au  Fact Sheet for Healthcare Providers: IncredibleEmployment.be  This test is not yet approved or cleared by the [place] FDA and has been authorized for detection and/or diagnosis of SARS-CoV-2 by FDA under an Emergency Use Authorization (EUA). This EUA will remain in effect (meaning this test can be used) for the duration of the COVID-19 declaration under Section 564(b)(1) of the Act, 21 U.S.C. section 360bbb-3(b)(1), unless the authorization is terminated or revoked.  Performed at [facility], [address] [place], [place] [address]   Blood Culture (routine x 2)     Status: None   Collection Time: 09/26/22 11:17 AM   Specimen: BLOOD RIGHT ARM  Result Value Ref Range Status   Specimen Description BLOOD RIGHT ARM  Final   Special Requests   Final    BOTTLES DRAWN AEROBIC AND ANAEROBIC  Blood Culture results may not be optimal due to an excessive volume of blood received in culture bottles   Culture   Final    NO GROWTH 5 DAYS Performed at [facility], [address] [place], [place] [address]    Report Status 10/01/2022 FINAL  Final  Blood Culture (routine x 2)     Status: None   Collection Time: 09/26/22 11:17 AM   Specimen: BLOOD LEFT ARM  Result Value Ref Range Status   Specimen Description BLOOD LEFT ARM  Final   Special Requests   Final    BOTTLES DRAWN AEROBIC AND ANAEROBIC Blood Culture results may not be optimal due to an excessive volume of blood received in culture bottles   Culture   Final    NO GROWTH 5 DAYS Performed at [facility], [address], [place], [place] [address]    Report Status 10/01/2022 FINAL  Final  MRSA Next Gen by PCR, Nasal     Status: None   Collection Time: 09/27/22 12:08 PM   Specimen: Nasal Mucosa; Nasal Swab  Result Value Ref Range Status   MRSA by PCR Next Gen NOT DETECTED NOT DETECTED Final    Comment: (NOTE)  The GeneXpert MRSA Assay (FDA approved for NASAL specimens only), is one component of a comprehensive MRSA colonization surveillance program. It is not intended to diagnose MRSA infection nor to guide or monitor treatment for MRSA infections. Test performance is not FDA approved in patients less than 52 years old. Performed at [facility], [address] [place], [place] [address]       Radiology Studies last 3 days: MR BRAIN WO CONTRAST  Result Date: 09/30/2022 CLINICAL DATA:  Delirium EXAM: MRI HEAD WITHOUT CONTRAST TECHNIQUE: Multiplanar, multiecho pulse sequences of the brain and surrounding structures were obtained without intravenous contrast. COMPARISON:  11/06/2020 FINDINGS: Brain: No acute infarct, mass effect or extra-axial collection. No chronic microhemorrhage or siderosis. There is multifocal hyperintense T2-weighted signal within the white matter. Generalized volume  loss. Old right cerebellar and right thalamic small vessel infarcts. The midline structures are normal. Vascular: Normal flow voids. Skull and upper cervical spine: Normal marrow signal. Sinuses/Orbits: Negative. Other: None. IMPRESSION: 1. No acute intracranial abnormality. 2. Old right cerebellar and right thalamic small vessel infarct and findings of chronic small vessel ischemia. Electronically Signed   By: Ulyses Jarred M.D.   On: 09/30/2022 21:07   EEG adult  Result Date: 09/29/2022 Lora Havens, MD     09/29/2022  7:43 PM Patient Name: EISA GUERETTE MRN: [id number] Epilepsy Attending: Lora Havens Referring Physician/Provider: Amie Portland, MD Date: 09/29/2022 Duration: 24.48 mins Patient history: 67yo M with ams. EEG to evaluate for seizure Level of alertness: Awake AEDs during EEG study: None Technical aspects: This EEG study was done with scalp electrodes positioned according to the 10-20 International system of electrode placement. Electrical activity was reviewed with band pass filter of 1-'70Hz'$ , sensitivity of 7 uV/mm, display speed of 7m/sec with a '60Hz'$  notched filter applied as appropriate. EEG data were recorded continuously and digitally stored.  Video monitoring was available and reviewed as appropriate. Description: No clear posterior dominant rhythm was seen. EEG showed continuous generalized predominantly 5-'7Hz'$  Hz theta slowing admixed with intermittent generalized rhythmic 2-'3Hz'$  delta slowing. Hyperventilation and photic stimulation were not performed.   ABNORMALITY - Continuous slow, generalized IMPRESSION: This study is suggestive of moderate diffuse encephalopathy, nonspecific etiology. No seizures or epileptiform discharges were seen throughout the recording. [place]            LOS: 7 days      NEmeterio Reeve DO [facility] 10/03/2022, 2:16 PM    Dictation software may have been used to generate the above note. Typos may occur and  escape review in typed/dictated notes. Please contact Dr ASheppard Coildirectly for clarity if needed.  Staff may message me via secure chat in [place] but this may not receive an immediate response,  please page me for urgent matters!  If 7PM-7AM, please contact night coverage [email or web address]

## 2022-10-04 LAB — GLUCOSE, CAPILLARY
Glucose-Capillary: 115 mg/dL — ABNORMAL HIGH (ref 70–99)
Glucose-Capillary: 211 mg/dL — ABNORMAL HIGH (ref 70–99)
Glucose-Capillary: 132 mg/dL — ABNORMAL HIGH (ref 70–99)
Glucose-Capillary: 112 mg/dL — ABNORMAL HIGH (ref 70–99)

## 2022-10-04 NOTE — Progress Notes (Signed)
RE: Evan [name] Date of Birth: October 19, 1955 Date: 10/04/2022     To Whom It May Concern:   Please be advised that the above-named patient will require a short-term nursing home stay - anticipated 30 days or less for rehabilitation and strengthening.  The plan is for return home

## 2022-10-04 NOTE — TOC Progression Note (Addendum)
Transition of Care [facility]) - Progression Note    Patient Details  Name: Evan [name] MRN: [id number] Date of Birth: 04-Jun-1956  [facility] CM/SW Contact  Laurena Slimmer, RN Phone Number: 10/04/2022, 9:03 AM  Clinical Narrative:    [place] PASSR pending Bed Search started Spoke with Dellia Nims regarding SNF placement She stated she would have patient's assigned guardian to contact this RNCM.    Expected Discharge Plan: Assisted Living Barriers to Discharge: Continued Medical Work up  Expected Discharge Plan and Services   Discharge Planning Services: CM Consult   Living arrangements for the past 2 months: Assisted Living Facility                 DME Arranged: N/A DME Agency: NA                   Social Determinants of Health (SDOH) Interventions SDOH Screenings   Food Insecurity: No Food Insecurity (09/26/2022)  Housing: Low Risk  (09/26/2022)  Transportation Needs: No Transportation Needs (09/26/2022)  Utilities: Not At Risk (09/26/2022)  Alcohol Screen: Low Risk  (12/13/2021)  Depression (PHQ2-9): Medium Risk (10/12/2021)  Financial Resource Strain: Low Risk  (08/22/2021)  Physical Activity: Sufficiently Active (02/25/2021)  Social Connections: Moderately Integrated (05/03/2021)  Stress: No Stress Concern Present (04/14/2021)  Tobacco Use: Low Risk  (09/26/2022)    Readmission Risk Interventions     No data to display

## 2022-10-04 NOTE — Plan of Care (Signed)
  Problem: Fluid Volume: Goal: Hemodynamic stability will improve Outcome: Progressing   Problem: Clinical Measurements: Goal: Diagnostic test results will improve Outcome: Progressing Goal: Signs and symptoms of infection will decrease Outcome: Progressing   Problem: Respiratory: Goal: Ability to maintain adequate ventilation will improve Outcome: Progressing   Problem: Clinical Measurements: Goal: Ability to maintain clinical measurements within normal limits will improve Outcome: Progressing Goal: Will remain free from infection Outcome: Progressing Goal: Diagnostic test results will improve Outcome: Progressing Goal: Respiratory complications will improve Outcome: Progressing Goal: Cardiovascular complication will be avoided Outcome: Progressing   Problem: Activity: Goal: Risk for activity intolerance will decrease Outcome: Progressing   Problem: Nutrition: Goal: Adequate nutrition will be maintained Outcome: Progressing   Problem: Coping: Goal: Level of anxiety will decrease Outcome: Progressing   Problem: Elimination: Goal: Will not experience complications related to bowel motility Outcome: Progressing Goal: Will not experience complications related to urinary retention Outcome: Progressing   Problem: Pain Managment: Goal: General experience of comfort will improve Outcome: Progressing   Problem: Safety: Goal: Ability to remain free from injury will improve Outcome: Progressing   Problem: Skin Integrity: Goal: Risk for impaired skin integrity will decrease Outcome: Progressing   Problem: Education: Goal: Individualized Educational Video(s) Outcome: Progressing   Problem: Coping: Goal: Ability to adjust to condition or change in health will improve Outcome: Progressing   Problem: Fluid Volume: Goal: Ability to maintain a balanced intake and output will improve Outcome: Progressing   Problem: Metabolic: Goal: Ability to maintain appropriate  glucose levels will improve Outcome: Progressing   Problem: Nutritional: Goal: Maintenance of adequate nutrition will improve Outcome: Progressing Goal: Progress toward achieving an optimal weight will improve Outcome: Progressing   Problem: Skin Integrity: Goal: Risk for impaired skin integrity will decrease Outcome: Progressing   Problem: Tissue Perfusion: Goal: Adequacy of tissue perfusion will improve Outcome: Progressing   Problem: Education: Goal: Knowledge of General Education information will improve Description: Including pain rating scale, medication(s)/side effects and non-pharmacologic comfort measures Outcome: Not Progressing Note: Patient experiencing confusion. Will continue to educate and reorient as needed.   Problem: Health Behavior/Discharge Planning: Goal: Ability to manage health-related needs will improve Outcome: Not Progressing Note: Patient experiencing confusion. Will continue to educate and reorient as needed.   Problem: Education: Goal: Ability to describe self-care measures that may prevent or decrease complications (Diabetes Survival Skills Education) will improve Outcome: Not Progressing Note: Patient experiencing confusion. Will continue to educate and reorient as needed.   Problem: Health Behavior/Discharge Planning: Goal: Ability to identify and utilize available resources and services will improve Outcome: Not Progressing Note: Patient experiencing confusion. Will continue to educate and reorient as needed. Goal: Ability to manage health-related needs will improve Outcome: Not Progressing Note: Patient experiencing confusion. Will continue to educate and reorient as needed.

## 2022-10-04 NOTE — TOC Progression Note (Addendum)
Transition of Care [facility]) - Progression Note    Patient Details  Name: Evan [name] MRN: [id number] Date of Birth: 1955-11-29  Transition of Care [facility]) CM/SW Contact  Laurena Slimmer, RN Phone Number: 10/04/2022, 3:39 PM  Clinical Narrative:    Damaris Schooner with [facility], Patient's DSS social worker. She inquired about when patient was admitted. She was advised T[facility]a Moos was advised of patient's admission per TOC note 2/21. [facility] was advised patient LOC was upgraded to SNF. She was also advised [place] PASSR is pending, FL2 completedand bed search started for local SNF's.   4:20pm FL2,H&P, MD 30 day note, and pysch note submitted to [facility] MUST for pending PASSR  8:18pm  PASSAR issued  [id number]  Expires 11/03/2022   Expected Discharge Plan: Assisted Living Barriers to Discharge: Continued Medical Work up  Expected Discharge Plan and Services   Discharge Planning Services: CM Consult   Living arrangements for the past 2 months: [place]                 DME Arranged: N/A DME Agency: NA                   Social Determinants of Health ([place]) Interventions SDOH Screenings   Food Insecurity: No Food Insecurity (09/26/2022)  Housing: Low Risk  (09/26/2022)  Transportation Needs: No Transportation Needs (09/26/2022)  Utilities: Not At Risk (09/26/2022)  Alcohol Screen: Low Risk  (12/13/2021)  Depression (PHQ2-9): Medium Risk (10/12/2021)  Financial Resource Strain: Low Risk  (08/22/2021)  Physical Activity: Sufficiently Active (02/25/2021)  Social Connections: Moderately Integrated (05/03/2021)  Stress: No Stress Concern Present (04/14/2021)  Tobacco Use: Low Risk  (09/26/2022)    Readmission Risk Interventions     No data to display

## 2022-10-04 NOTE — Progress Notes (Signed)
PROGRESS NOTE    Evan [name]  [name] DOB: 1956/03/27 DOA: 09/26/2022 PCP: Evan Dad, FNP  246A/246A-AA  LOS: 8 days   Brief hospital course:   Assessment & Plan: [name] is a 67 year old male with history of hyperlipidemia, paranoid schizophrenia, CAD, non-insulin-dependent diabetes mellitus, hypertension, obesity, diabetic retinopathy, who presents emergency department for chief concerns of dizziness from [facility] via EMS.    Hypotension - improved  --stress dose steroids initiated by ICU team, these have been d/c'ed --cont midodrine with taper  Sepsis, ruled out --no clear source of infection.  Complete 7 days of empiric abx  Altered mental status - improved  Uncertain etiology  Left ventricular hypokinesis inferolateral wall Moderate aortic stenosis cardiology outpatient  Consider low dose Lasix if BP tolerates  Strict I&O   Tremor, question parkinsonism (primary vs med effect) numbness lower extremities  --reported both numbness and pain, and weakness, "can't move" but then able to move somewhat Possible extrapyramidal symptoms from antipsychotics  EEG and TSH WNL, MRI no acute findings, B12, ammonia WNL Discontinued Haldol 1 mg at bedtime and increased his Risperdal 0.5 mg BID to 1 mg BID per psychiatry Neurology to follow outpatient in 8-12 weeks MRI brain was normal  --MRI lumbar spine showed Abnormal edema involving the partially visualized right piriformis muscle and lumbar stenosis  AKI (acute kidney injury) ([place]) - resolved Monitor BMP   Leukocytosis - improved Monitor CBC   History of CVA (cerebrovascular accident) aspirin 81 mg daily simvastatin 20 mg nightly   Essential Hypertension Holding home metoprolol succinate 50 mg daily at this time due to lower BP   COPD, mild (HCC) Not in acute exacerbation at this time --cont Dulera   Hyperlipidemia associated with type 2 diabetes mellitus (HCC) Simvastatin 20 mg  nightly    Paranoid schizophrenia ([place]) - stable  Psychiatry following   Concern for haldol causing extrapyramidal symptoms  Discontinued Haldol 1 mg at bedtime  Continue Risperdal at increased dose of 1 mg BID per psychiatry   History Atrial Fibrillation Continue Xarelto  Beta blocker on hold d/t hypotension   DVT prophylaxis: [id number]  Code Status: Full code  Family Communication:  Level of care: Progressive Dispo:   The patient is from: home Anticipated d/c is to: SNF rehab Anticipated d/c date is: whenever bed available   Subjective and Interval History:  Complained of bilateral leg numbness, pain and weakness.  Reported being incontinent of stool.   Objective: Vitals:   10/04/22 0545 10/04/22 0836 10/04/22 1123 10/04/22 1559  BP:  131/77 107/70 101/64  Pulse:  91 99 98  Resp:  '16 18 16  '$ Temp:  98 F (36.7 C) 97.9 F (36.6 C) 98.3 F (36.8 C)  TempSrc:  Oral Oral Oral  SpO2:  95% 100% 98%  Weight: 88.8 kg     Height:        Intake/Output Summary (Last 24 hours) at 10/04/2022 1915 Last data filed at 10/04/2022 1550 Gross per 24 hour  Intake 240 ml  Output 1700 ml  Net -1460 ml   Filed Weights   10/02/22 0901 10/03/22 0841 10/04/22 0545  Weight: 86.4 kg 88.1 kg 88.8 kg    Examination:   Constitutional: NAD, alert, thought process and speech somewhat disorganized HEENT: conjunctivae and lids normal, EOMI CV: No cyanosis.   RESP: normal respiratory effort, on RA Neuro: II - XII grossly intact.   Psych: depressed mood and affect.     Data Reviewed: I have personally  reviewed labs and imaging studies  Time spent: 35 minutes  Enzo Bi, MD [facility] If 7PM-7AM, please contact night-coverage 10/04/2022, 7:15 PM

## 2022-10-04 NOTE — NC FL2 (Signed)
[place] LEVEL OF CARE FORM     IDENTIFICATION  Patient Name: Evan [name] Birthdate: 19-Feb-1956 Sex: male Admission Date (Current Location): 09/26/2022  [facility] and [place] Number:  Engineering geologist and Address:  [facility], [address] [place], [place] [address]      Provider Number: [id number]  Attending Physician Name and Address:  Enzo Bi, MD  Relative Name and Phone Number:  [name] [phone]    Current Level of Care: Hospital Recommended Level of Care: [place] Prior Approval Number:    Date Approved/Denied:   PASRR Number:    Discharge Plan: SNF    Current Diagnoses: Patient Active Problem List   Diagnosis Date Noted   Left ventricular hypokinesis inferolateral wall 09/28/2022   Moderate aortic stenosis 09/28/2022   Shock circulatory ([place]) 09/27/2022   Elevated lactic acid level 09/27/2022   SIRS (systemic inflammatory response syndrome) ([place]) 09/26/2022   Leukocytosis 09/26/2022   AKI (acute kidney injury) ([place]) 09/26/2022   Undifferentiated schizophrenia ([place]) 12/13/2021   Elevated ferritin level 11/11/2021   Memory changes 08/12/2021   Other thrombophilia ([place]) 12/25/2020   Vitamin D deficiency 12/04/2020   Vitamin B12 deficiency 12/04/2020   Stenosis of right carotid artery 12/03/2020   History of CVA (cerebrovascular accident) 11/06/2020   History of 2019 novel coronavirus disease (COVID-19) 09/27/2020   Atherosclerosis of aorta ([place]) 12/04/2019   Persistent atrial fibrillation ([place]) 09/12/2019   CAD (coronary artery disease) 09/12/2019   Chronic venous stasis 09/12/2019   Hoarding disorder 09/12/2019   Cervical spinal stenosis 09/12/2019   Diabetic retinopathy ([place]) 09/12/2019   COPD, mild ([place]) 09/12/2019   Osteoporosis 09/09/2019   History of prostate cancer 09/09/2019   Paranoid schizophrenia ([place]) 08/10/2019   Obstructive sleep apnea on CPAP 08/10/2019    Hyperlipidemia associated with type 2 diabetes mellitus ([place]) 08/10/2019   Chronic diastolic CHF (congestive heart failure) ([place]) 01/09/2014   Esophageal dysmotility 09/12/2013   Type 2 diabetes mellitus with morbid obesity ([place]) 02/14/2012   Obesity, morbid (more than 100 lbs over ideal weight or BMI > 40) ([place]) 07/26/2011   Old myocardial infarction 03/02/2010   Hypertension associated with diabetes ([place]) 03/20/2009   CORONARY ATHEROSCLEROSIS, ARTERY BYPASS GRAFT 03/20/2009    Orientation RESPIRATION BLADDER Height & Weight     Self, Place  Normal External catheter Weight: 88.8 kg Height:  '5\' 5"'$  (165.1 cm)  BEHAVIORAL SYMPTOMS/MOOD NEUROLOGICAL BOWEL NUTRITION STATUS  Other (Comment) (n/a)  (n/a) Continent Diet (DYS 3)  AMBULATORY STATUS COMMUNICATION OF NEEDS Skin   Limited Assist Verbally  (erythema left arm and leg)                       Personal Care Assistance Level of Assistance  Bathing, Dressing Bathing Assistance: Limited assistance   Dressing Assistance: Limited assistance     Functional Limitations Info  Sight Sight Info: Impaired        SPECIAL CARE FACTORS FREQUENCY                       Contractures Contractures Info: Not present    Additional Factors Info  Code Status, Allergies Code Status Info: FULL Allergies Info: Penicillin G, Sulfa Antibiotics, Tiotropium, Zoloft (Sertraline Hcl)           Current Medications (10/04/2022):  This is the current hospital active medication list Current Facility-Administered Medications  Medication Dose Route Frequency Provider Last Rate Last Admin  0.9 %  sodium chloride infusion  250 mL Intravenous Continuous Emeterio Reeve, DO   Held at 09/27/22 1211   acetaminophen (TYLENOL) tablet 650 mg  650 mg Oral Q6H PRN Cox, Amy N, DO   650 mg at 10/03/22 1340   Or   acetaminophen (TYLENOL) suppository 650 mg  650 mg Rectal Q6H PRN Cox, Amy N, DO       aspirin EC tablet 81 mg  81 mg Oral Daily Cox,  Amy N, DO   81 mg at 10/03/22 1020   feeding supplement (ENSURE ENLIVE / ENSURE PLUS) liquid 237 mL  237 mL Oral BID BM Emeterio Reeve, DO   237 mL at 10/03/22 1027   insulin aspart (novoLOG) injection 0-5 Units  0-5 Units Subcutaneous QHS Cox, Amy N, DO       insulin aspart (novoLOG) injection 0-9 Units  0-9 Units Subcutaneous TID WC Cox, Amy N, DO   2 Units at 10/02/22 1334   LORazepam (ATIVAN) tablet 0.5 mg  0.5 mg Oral Q6H PRN Patrecia Pour, NP   0.5 mg at 10/01/22 1228   melatonin tablet 5 mg  5 mg Oral QHS PRN Cox, Amy N, DO   5 mg at 10/02/22 2239   menthol-cetylpyridinium (CEPACOL) lozenge 3 mg  1 lozenge Oral PRN Cox, Amy N, DO       midodrine (PROAMATINE) tablet 5 mg  5 mg Oral TID WC Emeterio Reeve, DO   5 mg at 10/03/22 1319   mometasone-formoterol (DULERA) 100-5 MCG/ACT inhaler 2 puff  2 puff Inhalation BID Cox, Amy N, DO   2 puff at 10/03/22 2134   multivitamin with minerals tablet 1 tablet  1 tablet Oral Daily Emeterio Reeve, DO   1 tablet at 10/02/22 [id number]   nitroGLYCERIN (NITROSTAT) SL tablet 0.4 mg  0.4 mg Sublingual Q5 min PRN Cox, Amy N, DO       nystatin cream (MYCOSTATIN)   Topical BID PRN Cox, Amy N, DO       ondansetron (ZOFRAN) tablet 4 mg  4 mg Oral Q6H PRN Cox, Amy N, DO   4 mg at 10/02/22 [id number]   Or   ondansetron (ZOFRAN) injection 4 mg  4 mg Intravenous Q6H PRN Cox, Amy N, DO       pantoprazole (PROTONIX) EC tablet 40 mg  40 mg Oral QAC breakfast Cox, Amy N, DO   40 mg at 10/03/22 1019   risperiDONE (RISPERDAL) tablet 1 mg  1 mg Oral BID Patrecia Pour, NP   1 mg at 10/03/22 2134   rivaroxaban (XARELTO) tablet 20 mg  20 mg Oral Q supper Cox, Amy N, DO   20 mg at 10/03/22 2134   senna (SENOKOT) tablet 17.2 mg  2 tablet Oral QHS Cox, Amy N, DO   17.2 mg at 10/03/22 2134   senna-docusate (Senokot-S) tablet 1 tablet  1 tablet Oral QHS PRN Cox, Amy N, DO       simvastatin (ZOCOR) tablet 20 mg  20 mg Oral QHS Cox, Amy N, DO   20 mg at 10/03/22 2134      Discharge Medications: Please see discharge summary for a list of discharge medications.  Relevant Imaging Results:  Relevant Lab Results:   Additional Information SS# [id number]  Laurena Slimmer, RN

## 2022-10-04 NOTE — Progress Notes (Signed)
PT Cancellation Note  Patient Details Name: Evan [name] MRN: [id number] DOB: [birth year]   Cancelled Treatment:     Therapist in to see pt who was resting in bed. Pt refused any activity stating he was not doing well today due to dizziness. Pt assisted with repositioning in bed, R UE supported to prevent lateral lean. Will re-attempt next available date/time per POC.    [name] 10/04/2022, 3:35 PM

## 2022-10-05 LAB — GLUCOSE, CAPILLARY
Glucose-Capillary: 111 mg/dL — ABNORMAL HIGH (ref 70–99)
Glucose-Capillary: 131 mg/dL — ABNORMAL HIGH (ref 70–99)
Glucose-Capillary: 114 mg/dL — ABNORMAL HIGH (ref 70–99)
Glucose-Capillary: 130 mg/dL — ABNORMAL HIGH (ref 70–99)

## 2022-10-05 NOTE — Progress Notes (Signed)
Physical Therapy Treatment Patient Details Name: Evan [name] MRN: [id number] DOB: 06-Aug-1956 Today's Date: 10/05/2022   History of Present Illness Pt is a 67 year old male admitted with SIRS (systemic inflammatory response syndrome) (HCC) / Severe sepsis unknown source, AMS; PMH significant for hyperlipidemia, paranoid schizophrenia, CAD, non-insulin-dependent diabetes mellitus, hypertension, obesity, diabetic retinopathy    PT Comments    Focused session on sitting EOB ~25 minutes for trunk strengthening and balance challenge. Pt able to perform reaching activities however unable to reach out of COG due to fear of falling. Pt required MaxA for Supine to sit and Max of 2 for sit to supine, Rolling L<>R with MaxA due to limited UE AROM and strength. Pt participated well throughout session without c/o pain. All needs met upon completion, nursing in room. Continue to recommend SNF at d/c once medically cleared.    Recommendations for follow up therapy are one component of a multi-disciplinary discharge planning process, led by the attending physician.  Recommendations may be updated based on patient status, additional functional criteria and insurance authorization.  Follow Up Recommendations  Skilled nursing-short term rehab (<3 hours/day) Can patient physically be transported by private vehicle: No   Assistance Recommended at Discharge Frequent or constant Supervision/Assistance  Patient can return home with the following Two people to help with walking and/or transfers;Two people to help with bathing/dressing/bathroom;Assist for transportation   Equipment Recommendations  None recommended by PT    Recommendations for Other Services       Precautions / Restrictions Precautions Precautions: Fall Restrictions Weight Bearing Restrictions: No     Mobility  Bed Mobility Overal bed mobility: Needs Assistance Bed Mobility: Supine to Sit, Sit to Supine     Supine to sit: Max  assist, HOB elevated Sit to supine: Max assist, HOB elevated   General bed mobility comments: +2 TOTAL A for boost in bed; improvements noted in supine>sit with pt attempting to move BLE along the bed, ultimately requiring max A however    Transfers                   General transfer comment:  (Did not attempt)    Ambulation/Gait               General Gait Details:  (unable)   Stairs             Wheelchair Mobility    Modified Rankin (Stroke Patients Only)       Balance Overall balance assessment: Needs assistance Sitting-balance support: Single extremity supported, Feet supported Sitting balance-Leahy Scale: Fair Sitting balance - Comments: Able to sit EOB 30+ Minutes Postural control: Right lateral lean                                  Cognition Arousal/Alertness: Awake/alert Behavior During Therapy: Flat affect Overall Cognitive Status: No family/caregiver present to determine baseline cognitive functioning                     Current Attention Level: Focused   Following Commands: Follows multi-step commands consistently   Awareness: Intellectual   General Comments:  (Pleasant and cooperative)        Exercises General Exercises - Lower Extremity Ankle Circles/Pumps: AAROM, Both, 5 reps Heel Slides: AAROM, Both, 5 reps Hip ABduction/ADduction: AAROM, Both, 5 reps Other Exercises Other Exercises:  (Pt resistive to AAROM B LE's)    General Comments General  comments (skin integrity, edema, etc.):  (Decreased AROM R UE, B lower legs cold to touch, rolling several times for hygiene assistance)      Pertinent Vitals/Pain Pain Assessment Pain Assessment: No/denies pain    Home Living                          Prior Function            PT Goals (current goals can now be found in the care plan section) Acute Rehab PT Goals Patient Stated Goal: Go to a different facility    Frequency    Min  2X/week      PT Plan Current plan remains appropriate    Co-evaluation              AM-PAC PT "6 Clicks" Mobility   Outcome Measure  Help needed turning from your back to your side while in a flat bed without using bedrails?: A Lot Help needed moving from lying on your back to sitting on the side of a flat bed without using bedrails?: A Lot Help needed moving to and from a bed to a chair (including a wheelchair)?: Total Help needed standing up from a chair using your arms (e.g., wheelchair or bedside chair)?: Total Help needed to walk in hospital room?: Total Help needed climbing 3-5 steps with a railing? : Total 6 Click Score: 8    End of Session   Activity Tolerance: Patient tolerated treatment well Patient left: in bed;with call bell/phone within reach;with bed alarm set;with nursing/sitter in room Nurse Communication: Mobility status PT Visit Diagnosis: Muscle weakness (generalized) (M62.81);Difficulty in walking, not elsewhere classified (R26.2);Pain;Unsteadiness on feet (R26.81)     Time: [id number] PT Time Calculation (min) (ACUTE ONLY): 39 min  Charges:  $Therapeutic Exercise: 8-22 mins $Therapeutic Activity: 23-37 mins                    Mikel Cella, PTA    [name] 10/05/2022, 2:33 PM

## 2022-10-05 NOTE — Progress Notes (Signed)
Nutrition Follow-up  DOCUMENTATION CODES:   Obesity unspecified  INTERVENTION:    -Continue with dysphagia 3 diet -Continue MVI with minerals daily -Continue Ensure Enlive po BID, each supplement provides 350 kcal and 20 grams of protein.   NUTRITION DIAGNOSIS:   Increased nutrient needs related to acute illness as evidenced by estimated needs.  Ongoing  GOAL:   Patient will meet greater than or equal to 90% of their needs  Progressing   MONITOR:   PO intake, Supplement acceptance, Labs, Weight trends, I & O's, Skin  REASON FOR ASSESSMENT:   Malnutrition Screening Tool    ASSESSMENT:   67 y/o male with h/o HTN, DM, PTSD, anxiety, CHF, schizophrenia, resident of a group home, OSA, HLD, esophageal motility, PAF, GERD, CAD s/p CABG, prostate cancer and CVA who is admitted with hypotension secondary to possible sepsis and hypovolemic shock.  Reviewed I/O's: -686 ml x 24 hours and +1.7 L since admission  UOP: 1.4 L x 24 hours   Pt lying in bed at time of visit. Pt with flat affect, states he's feeling ok today. He reports good appetite, but complains that he did not like the utensils he received at breakfast this morning. Noted meal completions 50-100%. Pt reports that he is drinking Ensure and states that they are "okay". Discussed importance of good meal and supplement intake to promote healing.   Wt has been stable since admission.   Per TOC notes, plan to discharge to ALF once medically stable.   Medications reviewed and include senokot.   Labs reviewed: CBGS: 112-132 (inpatient orders for glycemic control are 0-5 units insulin aspart daily at bedtime and 0-9 units insulin aspart TID with meals).    Diet Order:   Diet Order             DIET DYS 3 Room service appropriate? Yes with Assist; Fluid consistency: Thin  Diet effective now                   EDUCATION NEEDS:   Education needs have been addressed  Skin:  Skin Assessment: Reviewed RN  Assessment  Last BM:  10/03/22  Height:   Ht Readings from Last 1 Encounters:  09/27/22 '5\' 5"'$  (1.651 m)    Weight:   Wt Readings from Last 1 Encounters:  10/05/22 89.3 kg    Ideal Body Weight:  61.8 kg  BMI:  Body mass index is 32.76 kg/m.  Estimated Nutritional Needs:   Kcal:  1700-2000kcal/day  Protein:  85-100g/day  Fluid:  1.7-1.9L/day    Loistine Chance, RD, LDN, [place] Registered Dietitian II Certified Diabetes Care and Education Specialist Please refer to [facility] for RD and/or RD on-call/weekend/after hours pager

## 2022-10-05 NOTE — TOC Progression Note (Addendum)
Transition of Care [facility]) - Progression Note    Patient Details  Name: Evan [name] MRN: [id number] Date of Birth: 1956/04/26  Transition of Care [facility]) CM/SW Contact  Laurena Slimmer, RN Phone Number: 10/05/2022, 1:17 PM  Clinical Narrative:    Attempt to reach Neoma Laming regarding a message in the [place] requesting information on POA. Unable to leave a message.   No current bed offer.    Expected Discharge Plan: Assisted Living Barriers to Discharge: Continued Medical Work up  Expected Discharge Plan and Services   Discharge Planning Services: CM Consult   Living arrangements for the past 2 months: Assisted Living Facility                 DME Arranged: N/A DME Agency: NA                   Social Determinants of Health (SDOH) Interventions SDOH Screenings   Food Insecurity: No Food Insecurity (09/26/2022)  Housing: Low Risk  (09/26/2022)  Transportation Needs: No Transportation Needs (09/26/2022)  Utilities: Not At Risk (09/26/2022)  Alcohol Screen: Low Risk  (12/13/2021)  Depression (PHQ2-9): Medium Risk (10/12/2021)  Financial Resource Strain: Low Risk  (08/22/2021)  Physical Activity: Sufficiently Active (02/25/2021)  Social Connections: Moderately Integrated (05/03/2021)  Stress: No Stress Concern Present (04/14/2021)  Tobacco Use: Low Risk  (09/26/2022)    Readmission Risk Interventions     No data to display

## 2022-10-05 NOTE — Progress Notes (Signed)
PROGRESS NOTE    Evan [name]  [name] DOB: Sep 03, 1955 DOA: 09/26/2022 PCP: [name], FNP  101A/101A-AA  LOS: 9 days   Brief hospital course:   Assessment & Plan: [name] is a 67 year old male with history of hyperlipidemia, paranoid schizophrenia, CAD, non-insulin-dependent diabetes mellitus, hypertension, obesity, diabetic retinopathy, who presents emergency department for chief concerns of dizziness from [facility] via EMS.    Hypotension - improved  --stress dose steroids initiated by ICU team, these have been d/c'ed --cont midodrine with taper  Sepsis, ruled out --no clear source of infection.  Complete 7 days of empiric abx  Altered mental status - improved  Uncertain etiology  Left ventricular hypokinesis inferolateral wall Moderate aortic stenosis cardiology outpatient  Consider low dose Lasix if BP tolerates  Strict I&O   Tremor, question parkinsonism (primary vs med effect) numbness lower extremities  --reported both numbness and pain, and weakness, "can't move" but then able to move somewhat Possible extrapyramidal symptoms from antipsychotics  EEG and TSH WNL, MRI no acute findings, B12, ammonia WNL Discontinued Haldol 1 mg at bedtime and increased his Risperdal 0.5 mg BID to 1 mg BID per psychiatry Neurology to follow outpatient in 8-12 weeks MRI brain was normal  --MRI lumbar spine showed Abnormal edema involving the partially visualized right piriformis muscle and lumbar stenosis  AKI (acute kidney injury) ([place]) - resolved Monitor BMP   Leukocytosis - improved Monitor CBC   History of CVA (cerebrovascular accident) aspirin 81 mg daily simvastatin 20 mg nightly   Essential Hypertension Holding home metoprolol succinate 50 mg daily at this time due to lower BP   COPD, mild (HCC) Not in acute exacerbation at this time --cont Dulera   Hyperlipidemia associated with type 2 diabetes mellitus (HCC) Simvastatin 20 mg  nightly    Paranoid schizophrenia ([place]) - stable  Psychiatry following   Concern for haldol causing extrapyramidal symptoms  Discontinued Haldol 1 mg at bedtime  Continue Risperdal at increased dose of 1 mg BID per psychiatry   History Atrial Fibrillation Continue Xarelto  Beta blocker on hold d/t hypotension   DVT prophylaxis: [id number]  Code Status: Full code  Family Communication:  Level of care: Med-Surg Dispo:   The patient is from: home Anticipated d/c is to: SNF rehab Anticipated d/c date is: whenever bed available   Subjective and Interval History:  Complained of numbness in his legs.   Objective: Vitals:   10/05/22 0915 10/05/22 1202 10/05/22 1529 10/05/22 1836  BP:  118/62 (!) 125/92 (!) 92/51  Pulse:  98 79 67  Resp:  '16 20 20  '$ Temp:  98.3 F (36.8 C) 98.6 F (37 C) 99.2 F (37.3 C)  TempSrc:  Oral Oral   SpO2:  97% 100% 93%  Weight: 89.3 kg     Height:        Intake/Output Summary (Last 24 hours) at 10/05/2022 1856 Last data filed at 10/05/2022 1800 Gross per 24 hour  Intake 660 ml  Output 2050 ml  Net -1390 ml   Filed Weights   10/03/22 0841 10/04/22 0545 10/05/22 0915  Weight: 88.1 kg 88.8 kg 89.3 kg    Examination:   Constitutional: NAD, alert, oriented to person and place HEENT: conjunctivae and lids normal, EOMI CV: No cyanosis.   RESP: normal respiratory effort, on RA Neuro: II - XII grossly intact.   Psych: depressed mood and affect.     Data Reviewed: I have personally reviewed labs and imaging studies  Time  spent: 25 minutes  Enzo Bi, MD [facility] If 7PM-7AM, please contact night-coverage 10/05/2022, 6:56 PM

## 2022-10-06 ENCOUNTER — Other Ambulatory Visit (HOSPITAL_COMMUNITY): Payer: Self-pay

## 2022-10-06 LAB — GLUCOSE, CAPILLARY
Glucose-Capillary: 116 mg/dL — ABNORMAL HIGH (ref 70–99)
Glucose-Capillary: 139 mg/dL — ABNORMAL HIGH (ref 70–99)
Glucose-Capillary: 128 mg/dL — ABNORMAL HIGH (ref 70–99)

## 2022-10-06 MED ORDER — ADULT MULTIVITAMIN W/MINERALS CH: 1.0000 | ORAL_TABLET | Freq: Every day | ORAL | Status: DC

## 2022-10-06 MED ORDER — MELATONIN 5 MG PO TABS: 5.0000 mg | ORAL_TABLET | Freq: Every evening | ORAL | 0 refills | Status: DC | PRN

## 2022-10-06 MED ORDER — APIXABAN 5 MG PO TABS: 5.0000 mg | ORAL_TABLET | Freq: Two times a day (BID) | ORAL | Status: DC

## 2022-10-06 MED ORDER — APIXABAN 5 MG PO TABS
5.0000 mg | ORAL_TABLET | Freq: Two times a day (BID) | ORAL | Status: DC
  Administered 2022-10-06: 5 mg via ORAL
  Filled 2022-10-06: qty 1

## 2022-10-06 MED ORDER — MIDODRINE HCL 5 MG PO TABS: 5.0000 mg | ORAL_TABLET | Freq: Three times a day (TID) | ORAL | Status: DC

## 2022-10-06 MED ORDER — ENSURE ENLIVE PO LIQD: 237.0000 mL | Freq: Two times a day (BID) | ORAL | Status: DC

## 2022-10-06 NOTE — TOC Progression Note (Signed)
Transition of Care [facility]) - Progression Note    Patient Details  Name: Evan [name] MRN: [id number] Date of Birth: August 27, 1955  [facility]) CM/SW Contact  Gerilyn Pilgrim, LCSW Phone Number: 10/06/2022, 11:49 AM  Clinical Narrative:   Josem Kaufmann started for [facility]. Guardian [place] notified.     Expected Discharge Plan: Assisted Living Barriers to Discharge: Continued Medical Work up  Expected Discharge Plan and Services   Discharge Planning Services: CM Consult   Living arrangements for the past 2 months: Assisted Living Facility                 DME Arranged: N/A DME Agency: NA                   Social Determinants of Health (SDOH) Interventions SDOH Screenings   Food Insecurity: No Food Insecurity (09/26/2022)  Housing: Low Risk  (09/26/2022)  Transportation Needs: No Transportation Needs (09/26/2022)  Utilities: Not At Risk (09/26/2022)  Alcohol Screen: Low Risk  (12/13/2021)  Depression (PHQ2-9): Medium Risk (10/12/2021)  Financial Resource Strain: Low Risk  (08/22/2021)  Physical Activity: Sufficiently Active (02/25/2021)  Social Connections: Moderately Integrated (05/03/2021)  Stress: No Stress Concern Present (04/14/2021)  Tobacco Use: Low Risk  (09/26/2022)    Readmission Risk Interventions     No data to display

## 2022-10-06 NOTE — TOC Benefit Eligibility Note (Signed)
Patient Teacher, English as a foreign language completed.    The patient is currently admitted and upon discharge could be taking Eliquis 5 mg.  The current 30 day co-pay is $0.00.   The patient is insured through [place], [place] Patient Advocate Specialist [place] Patient Advocate Team Direct Number: [phone]  Fax: [phone]

## 2022-10-06 NOTE — TOC Progression Note (Signed)
Transition of Care [facility]) - Progression Note    Patient Details  Name: Evan [name] MRN: [id number] Date of Birth: [birth year]  Transition of Care [facility]) CM/SW Contact  Gerilyn Pilgrim, LCSW Phone Number: 10/06/2022, 10:43 AM  Clinical Narrative:  [facility] can accept for rehab if pt xarelto can be switched to Elliquis. CSW notified MD.       Expected Discharge Plan: Assisted Living Barriers to Discharge: Continued Medical Work up  Expected Discharge Plan and Services   Discharge Planning Services: CM Consult   Living arrangements for the past 2 months: Assisted Living Facility                 DME Arranged: N/A DME Agency: NA                   Social Determinants of Health (SDOH) Interventions SDOH Screenings   Food Insecurity: No Food Insecurity (09/26/2022)  Housing: Low Risk  (09/26/2022)  Transportation Needs: No Transportation Needs (09/26/2022)  Utilities: Not At Risk (09/26/2022)  Alcohol Screen: Low Risk  (12/13/2021)  Depression (PHQ2-9): Medium Risk (10/12/2021)  Financial Resource Strain: Low Risk  (08/22/2021)  Physical Activity: Sufficiently Active (02/25/2021)  Social Connections: Moderately Integrated (05/03/2021)  Stress: No Stress Concern Present (04/14/2021)  Tobacco Use: Low Risk  (09/26/2022)    Readmission Risk Interventions     No data to display

## 2022-10-06 NOTE — Plan of Care (Signed)
  Problem: Fluid Volume: Goal: Hemodynamic stability will improve Outcome: Progressing   

## 2022-10-06 NOTE — Care Management Important Message (Signed)
Important Message  Patient Details  Name: Evan [name] MRN: [id number] Date of Birth: [birth year]   Medicare Important Message Given:  Other (see comment)  Finally obtained guardian information from [place]. Left a voice mail for [employer] with [place] [phone]). No answer. Contacted her supervisor, Toula Moos and left a voice message with her as well for a call back to review the Important Message from Medicare. Will await a call back.   Juliann Pulse A Ladaja Yusupov 10/06/2022, 2:31 PM

## 2022-10-06 NOTE — TOC Transition Note (Addendum)
Transition of Care [facility]) - CM/SW Discharge Note   Patient Details  Name: BERLE VOLLRATH MRN: [id number] Date of Birth: 1956/05/13  Transition [facility] Care [facility]) CM/SW Contact:  Gerilyn Pilgrim, LCSW Phone Number: 10/06/2022, 1:54 PM   Clinical Narrative:  Pt has auth to go to [facility]. Barrie Lyme with DSS notified. RN given number for report. CSW printed medical necessity to unit.       Barriers to Discharge: Continued Medical Work up   Patient Goals and CMS Choice CMS Medicare.gov Compare Post Acute Care list provided to:: Legal Guardian Choice offered to / list presented to : [place] / [place]  Discharge Placement                         Discharge Plan and Services Additional resources added to the After Visit Summary for     Discharge Planning Services: CM Consult            DME Arranged: N/A DME Agency: NA                  Social Determinants of Health (SDOH) Interventions SDOH Screenings   Food Insecurity: No Food Insecurity (09/26/2022)  Housing: Low Risk  (09/26/2022)  Transportation Needs: No Transportation Needs (09/26/2022)  Utilities: Not At Risk (09/26/2022)  Alcohol Screen: Low Risk  (12/13/2021)  Depression (PHQ2-9): Medium Risk (10/12/2021)  Financial Resource Strain: Low Risk  (08/22/2021)  Physical Activity: Sufficiently Active (02/25/2021)  Social Connections: Moderately Integrated (05/03/2021)  Stress: No Stress Concern Present (04/14/2021)  Tobacco Use: Low Risk  (09/26/2022)     Readmission Risk Interventions     No data to display

## 2022-10-06 NOTE — NC FL2 (Signed)
[place] LEVEL OF CARE FORM     IDENTIFICATION  Patient Name: Evan [name] Birthdate: 1955-11-26 Sex: male Admission Date (Current Location): 09/26/2022  [facility] and [place] Number:  Engineering geologist and Address:  [facility], [address] [place], [place] [address]      Provider Number: [id number]  Attending Physician Name and Address:  Enzo Bi, MD  Relative Name and Phone Number:  [name] [phone]    Current Level of Care: Hospital Recommended Level of Care: [place] Prior Approval Number:    Date Approved/Denied:   PASRR Number: [id number]  Discharge Plan: SNF    Current Diagnoses: Patient Active Problem List   Diagnosis Date Noted   Left ventricular hypokinesis inferolateral wall 09/28/2022   Moderate aortic stenosis 09/28/2022   Shock circulatory ([place]) 09/27/2022   Elevated lactic acid level 09/27/2022   SIRS (systemic inflammatory response syndrome) ([place]) 09/26/2022   Leukocytosis 09/26/2022   AKI (acute kidney injury) ([place]) 09/26/2022   Undifferentiated schizophrenia ([place]) 12/13/2021   Elevated ferritin level 11/11/2021   Memory changes 08/12/2021   Other thrombophilia ([place]) 12/25/2020   Vitamin D deficiency 12/04/2020   Vitamin B12 deficiency 12/04/2020   Stenosis of right carotid artery 12/03/2020   History of CVA (cerebrovascular accident) 11/06/2020   History of 2019 novel coronavirus disease (COVID-19) 09/27/2020   Atherosclerosis of aorta ([place]) 12/04/2019   Persistent atrial fibrillation ([place]) 09/12/2019   CAD (coronary artery disease) 09/12/2019   Chronic venous stasis 09/12/2019   Hoarding disorder 09/12/2019   Cervical spinal stenosis 09/12/2019   Diabetic retinopathy ([place]) 09/12/2019   COPD, mild ([place]) 09/12/2019   Osteoporosis 09/09/2019   History of prostate cancer 09/09/2019   Paranoid schizophrenia ([place]) 08/10/2019   Obstructive sleep apnea on CPAP 08/10/2019    Hyperlipidemia associated with type 2 diabetes mellitus ([place]) 08/10/2019   Chronic diastolic CHF (congestive heart failure) ([place]) 01/09/2014   Esophageal dysmotility 09/12/2013   Type 2 diabetes mellitus with morbid obesity ([place]) 02/14/2012   Obesity, morbid (more than 100 lbs over ideal weight or BMI > 40) ([place]) 07/26/2011   Old myocardial infarction 03/02/2010   Hypertension associated with diabetes ([place]) 03/20/2009   CORONARY ATHEROSCLEROSIS, ARTERY BYPASS GRAFT 03/20/2009    Orientation RESPIRATION BLADDER Height & Weight     Self, Place  Normal External catheter Weight: 196 lb 6.9 oz (89.1 kg) Height:  '5\' 5"'$  (165.1 cm)  BEHAVIORAL SYMPTOMS/MOOD NEUROLOGICAL BOWEL NUTRITION STATUS  Other (Comment) (n/a)  (n/a) Continent Diet (DYS 3)  AMBULATORY STATUS COMMUNICATION OF NEEDS Skin   Limited Assist Verbally  (erythema left arm and leg)                       Personal Care Assistance Level of Assistance  Bathing, Dressing Bathing Assistance: Limited assistance   Dressing Assistance: Limited assistance     Functional Limitations Info  Sight Sight Info: Impaired        SPECIAL CARE FACTORS FREQUENCY                       Contractures Contractures Info: Not present    Additional Factors Info  Code Status, Allergies Code Status Info: FULL Allergies Info: Penicillin G, Sulfa Antibiotics, Tiotropium, Zoloft (Sertraline Hcl)           Current Medications (10/06/2022):  This is the current hospital active medication list Current Facility-Administered Medications  Medication Dose Route Frequency Provider Last  Rate Last Admin   0.9 %  sodium chloride infusion  250 mL Intravenous Continuous Emeterio Reeve, DO   Held at 09/27/22 1211   acetaminophen (TYLENOL) tablet 650 mg  650 mg Oral Q6H PRN Cox, Amy N, DO   650 mg at 10/06/22 1040   Or   acetaminophen (TYLENOL) suppository 650 mg  650 mg Rectal Q6H PRN Cox, Amy N, DO       aspirin EC tablet 81 mg  81 mg  Oral Daily Cox, Amy N, DO   81 mg at 10/06/22 0846   feeding supplement (ENSURE ENLIVE / ENSURE PLUS) liquid 237 mL  237 mL Oral BID BM Emeterio Reeve, DO   237 mL at 10/06/22 0850   insulin aspart (novoLOG) injection 0-5 Units  0-5 Units Subcutaneous QHS Cox, Amy N, DO       insulin aspart (novoLOG) injection 0-9 Units  0-9 Units Subcutaneous TID WC Cox, Amy N, DO   1 Units at 10/05/22 1224   LORazepam (ATIVAN) tablet 0.5 mg  0.5 mg Oral Q6H PRN Patrecia Pour, NP   0.5 mg at 10/01/22 1228   melatonin tablet 5 mg  5 mg Oral QHS PRN Cox, Amy N, DO   5 mg at 10/04/22 2100   menthol-cetylpyridinium (CEPACOL) lozenge 3 mg  1 lozenge Oral PRN Cox, Amy N, DO       midodrine (PROAMATINE) tablet 5 mg  5 mg Oral TID WC Emeterio Reeve, DO   5 mg at 10/06/22 0846   mometasone-formoterol (DULERA) 100-5 MCG/ACT inhaler 2 puff  2 puff Inhalation BID Cox, Amy N, DO   2 puff at 10/06/22 0846   multivitamin with minerals tablet 1 tablet  1 tablet Oral Daily Emeterio Reeve, DO   1 tablet at 10/06/22 0846   nitroGLYCERIN (NITROSTAT) SL tablet 0.4 mg  0.4 mg Sublingual Q5 min PRN Cox, Amy N, DO       nystatin cream (MYCOSTATIN)   Topical BID PRN Cox, Amy N, DO       ondansetron (ZOFRAN) tablet 4 mg  4 mg Oral Q6H PRN Cox, Amy N, DO   4 mg at 10/02/22 0343   Or   ondansetron (ZOFRAN) injection 4 mg  4 mg Intravenous Q6H PRN Cox, Amy N, DO       pantoprazole (PROTONIX) EC tablet 40 mg  40 mg Oral QAC breakfast Cox, Amy N, DO   40 mg at 10/06/22 0846   risperiDONE (RISPERDAL) tablet 1 mg  1 mg Oral BID Patrecia Pour, NP   1 mg at 10/06/22 [id number]   rivaroxaban (XARELTO) tablet 20 mg  20 mg Oral Q supper Cox, Amy N, DO   20 mg at 10/05/22 1802   senna (SENOKOT) tablet 17.2 mg  2 tablet Oral QHS Cox, Amy N, DO   17.2 mg at 10/05/22 2136   senna-docusate (Senokot-S) tablet 1 tablet  1 tablet Oral QHS PRN Cox, Amy N, DO       simvastatin (ZOCOR) tablet 20 mg  20 mg Oral QHS Cox, Amy N, DO   20 mg at 10/05/22  2136     Discharge Medications: Please see discharge summary for a list of discharge medications.  Relevant Imaging Results:  Relevant Lab Results:   Additional Information SS# [id number]  Gerilyn Pilgrim, LCSW

## 2022-10-06 NOTE — Discharge Summary (Addendum)
Physician Discharge Summary   Evan [name]  male DOB: Dec 06, 1955  [name]  PCP: Evan Dad, FNP  Admit date: 09/26/2022 Discharge date: 10/06/2022  Admitted From: [facility]  Disposition:  SNF rehab CODE STATUS: Full code  Discharge Instructions     Ambulatory referral to Neurology   Complete by: As directed    An appointment is requested in approximately: 8-12 weeks      Hospital Course:  For full details, please see H&P, progress notes, consult notes and ancillary notes.  Briefly,  [name] is a 67 year old male with history of paranoid schizophrenia, CAD, non-insulin-dependent diabetes mellitus, hypertension, obesity, diabetic retinopathy, who presented to emergency department for chief concerns of dizziness from [facility] via EMS.    Hypotension - improved  --stress dose steroids initiated by ICU team, these have been d/c'ed --home BP meds d/c'ed. --started on midodrine which has been tapered down to 5 mg TID.  Can continue to taper after discharge if BP increases.   Sepsis, ruled out --no clear source of infection.  Complete 7 days of empiric abx   Altered mental status - improved  Uncertain etiology   Left ventricular hypokinesis inferolateral wall Moderate aortic stenosis --outpatient cardiology f/u   Paranoid schizophrenia ([place]) - stable  Tremor, question parkinsonism (primary vs med effect) --Possible extrapyramidal symptoms from antipsychotics  EEG and TSH WNL, MRI no acute findings, B12, ammonia WNL --Discontinued Haldol 1 mg at bedtime and increased his Risperdal 0.5 mg BID to 1 mg BID per psychiatry --Neurology to follow outpatient in 8-12 weeks --MRI brain was normal  --MRI lumbar spine showed Abnormal edema involving the partially visualized right piriformis muscle and lumbar stenosis, which    Numbness and pain in lower extremities  --MRI lumbar spine showed "Abnormal edema involving the partially visualized right  piriformis muscle, with small volume free fluid within the adjacent presacral space. Findings are nonspecific, but could reflect sequelae of recent trauma with muscular injury/strain." "This finding could contribute to right lower extremity symptoms given its close proximity to the proximal right sciatic nerve." --discussed with oncall neuro, nothing can be done with this muscle edema/injury with involvement of nerve, which may take a long time to recover.  AKI (acute kidney injury) ([place]) - resolved --Cr 1.55 on presentation.  Cr 0.7 prior to discharge.   Leukocytosis - improved   History of CVA (cerebrovascular accident) --cont ASA and statin   COPD, mild (HCC) Not in acute exacerbation at this time --received Dulera while inpatient, discharged back on Symbicort   Hyperlipidemia associated with type 2 diabetes mellitus ([place]) --cont statin   History Atrial Fibrillation --Switched from Xarelto to Eliquis at discharge. --Toprol on hold d/t hypotension   Discharge Diagnoses:  Principal Problem:   SIRS (systemic inflammatory response syndrome) (HCC) Active Problems:   Hypertension associated with diabetes (HCC)   Esophageal dysmotility   Chronic diastolic CHF (congestive heart failure) (HCC)   Paranoid schizophrenia (HCC)   Type 2 diabetes mellitus with morbid obesity (HCC)   Obstructive sleep apnea on CPAP   Hyperlipidemia associated with type 2 diabetes mellitus (HCC)   Persistent atrial fibrillation (HCC)   CAD (coronary artery disease)   Hoarding disorder   COPD, mild (HCC)   Atherosclerosis of aorta (HCC)   History of CVA (cerebrovascular accident)   Undifferentiated schizophrenia ([place])   Leukocytosis   AKI (acute kidney injury) ([place])   Shock circulatory (HCC)   Elevated lactic acid level   Left ventricular hypokinesis inferolateral  wall   Moderate aortic stenosis   30 Day Unplanned Readmission Risk Score    Flowsheet Row ED to Hosp-Admission (Current) from  09/26/2022 in [facility]  30 Day Unplanned Readmission Risk Score (%) 17.93 Filed at 10/06/2022 1200       This score is the patient's risk of an unplanned readmission within 30 days of being discharged (0 -100%). The score is based on dignosis, age, lab data, medications, orders, and past utilization.   Low:  0-14.9   Medium: 15-21.9   High: 22-29.9   Extreme: 30 and above         Discharge Instructions:  Allergies as of 10/06/2022       Reactions   Penicillin G Hives   Sulfa Antibiotics Hives   Tiotropium    Zoloft [sertraline Hcl] Other (See Comments)        Medication List     STOP taking these medications    busPIRone 5 MG tablet Commonly known as: BUSPAR   Cepacol Extra Strength 15-2.6 MG Lozg Generic drug: Benzocaine-Menthol   ezetimibe 10 MG tablet Commonly known as: Zetia   furosemide 40 MG tablet Commonly known as: LASIX   haloperidol 1 MG tablet Commonly known as: HALDOL   metoprolol succinate 50 MG 24 hr tablet Commonly known as: TOPROL-XL   Mucinex Maximum Strength 1200 MG Tb12 Generic drug: Guaifenesin   olmesartan 5 MG tablet Commonly known as: BENICAR   potassium chloride SA 20 MEQ tablet Commonly known as: KLOR-CON M   psyllium 95 % Pack Commonly known as: HYDROCIL/METAMUCIL   rivaroxaban 20 MG Tabs tablet Commonly known as: XARELTO   rosuvastatin 5 MG tablet Commonly known as: CRESTOR   triamcinolone cream 0.1 % Commonly known as: KENALOG       TAKE these medications    albuterol 108 (90 Base) MCG/ACT inhaler Commonly known as: VENTOLIN HFA Inhale 2 puffs into the lungs every 6 (six) hours as needed for wheezing or shortness of breath.   apixaban 5 MG Tabs tablet Commonly known as: ELIQUIS Take 1 tablet (5 mg total) by mouth 2 (two) times daily.   aspirin EC 81 MG tablet Take 1 tablet (81 mg total) by mouth daily. Swallow whole.   cyanocobalamin 1000 MCG tablet Commonly  known as: VITAMIN B12 Take 1 tablet (1,000 mcg total) by mouth daily.   D-5000 125 MCG (5000 UT) Tabs Generic drug: Cholecalciferol Take 1 tablet by mouth daily.   empagliflozin 10 MG Tabs tablet Commonly known as: Jardiance Take 1 tablet (10 mg total) by mouth daily.   feeding supplement Liqd Take 237 mLs by mouth 2 (two) times daily between meals. Start taking on: October 07, 2022   magnesium hydroxide 400 MG/5ML suspension Commonly known as: MILK OF MAGNESIA Take 5 mLs by mouth daily as needed for mild constipation.   melatonin 5 MG Tabs Take 1 tablet (5 mg total) by mouth at bedtime as needed.   midodrine 5 MG tablet Commonly known as: PROAMATINE Take 1 tablet (5 mg total) by mouth 3 (three) times daily with meals.   multivitamin with minerals Tabs tablet Take 1 tablet by mouth daily. Start taking on: October 07, 2022   nitroGLYCERIN 0.4 MG SL tablet Commonly known as: NITROSTAT Place 1 tablet (0.4 mg total) under the tongue every 5 (five) minutes as needed for chest pain.   nystatin cream Commonly known as: MYCOSTATIN Apply topically 2 (two) times daily.   pantoprazole 40 MG  tablet Commonly known as: PROTONIX Take 1 tablet (40 mg total) by mouth daily.   Probiotic 250 MG Caps TAKE 1 CAPSULE BY MOUTH EVERY MORNING AND AT BEDTIME   risperiDONE 0.5 MG tablet Commonly known as: RISPERDAL Take 1 tablet (0.5 mg total) by mouth 2 (two) times daily. What changed: how much to take   Senna 8.6 MG Caps Take 2 capsules by mouth daily.   simvastatin 20 MG tablet Commonly known as: ZOCOR Take 20 mg by mouth at bedtime.   Symbicort 80-4.5 MCG/ACT inhaler Generic drug: budesonide-formoterol INHALE 2 PUFFS BY MOUTH 2 TIMES DAILY. RINSE MOUTH AFTER USE.   TYLENOL 500 MG tablet Generic drug: acetaminophen Take 500 mg by mouth 3 (three) times daily as needed.   ZyrTEC Allergy 10 MG tablet Generic drug: cetirizine Take 10 mg by mouth at bedtime.         Contact  information for follow-up providers     Evan Dad, FNP Follow up.   Specialty: Family Medicine Contact information: [address] [place] [address] [phone]         Vladimir Crofts, MD Follow up in 8 week(s).   Specialty: Neurology Contact information: [place] [facility] [place] [address] [phone]              Contact information for after-discharge care     Destination     [facility] .   Service: Skilled Nursing Contact information: [place] [address] [phone]                     Allergies  Allergen Reactions   Penicillin G Hives   Sulfa Antibiotics Hives   Tiotropium    Zoloft [Sertraline Hcl] Other (See Comments)     The results of significant diagnostics from this hospitalization (including imaging, microbiology, ancillary and laboratory) are listed below for reference.   Consultations:   Procedures/Studies: MR LUMBAR SPINE WO CONTRAST  Result Date: 10/04/2022 CLINICAL DATA:  Initial evaluation for myelopathy, right lower extremity numbness. EXAM: MRI LUMBAR SPINE WITHOUT CONTRAST TECHNIQUE: Multiplanar, multisequence MR imaging of the lumbar spine was performed. No intravenous contrast was administered. COMPARISON:  Prior MRI from 11/06/2020. FINDINGS: Segmentation: Standard. Lowest well-formed disc space labeled the L5-S1 level. Alignment: Exaggeration of the normal lumbar lordosis. 3 mm facet mediated anterolisthesis of L4 on L5. Trace degenerative retrolisthesis of T12 on L1 and L1 on L2. Vertebrae: Vertebral body height maintained without acute or chronic fracture. Bone marrow signal intensity within normal limits. No discrete or worrisome osseous lesions. No abnormal marrow edema. Conus medullaris and cauda equina: Conus extends to the T12-L1 level. Conus and cauda equina appear normal. Paraspinal and other  soft tissues: Small volume free fluid noted within the presacral space. Abnormal edema seen involving the partially visualized right piriformis muscle (series 7, image 3). Asymmetric enlargement with irregularity seen about the right piriformis muscle on prior CT from 09/26/2022 as well (series 2, image 68 on that exam). Finding of uncertain etiology. Paraspinous soft tissues demonstrate no other acute finding. Disc levels: T11-12: Disc desiccation without significant disc bulge. No stenosis. T12-L1: Disc desiccation with diffuse disc bulge. No spinal stenosis. Foramina remain patent. L1-2: Degenerative intervertebral disc space narrowing with diffuse disc bulge and disc desiccation. Mild left greater than right facet and ligament flavum hypertrophy. Resultant mild canal with left lateral recess stenosis. Mild left L1 foraminal narrowing. Right neural  foramen remains patent. L2-3: Disc desiccation with mild diffuse disc bulge. Mild bilateral facet hypertrophy. Resultant mild spinal stenosis. Mild bilateral L2 foraminal narrowing. L3-4: Disc desiccation with mild diffuse disc bulge. Mild to moderate facet hypertrophy. Resultant mild spinal stenosis. Mild bilateral L3 foraminal narrowing. L4-5: Anterolisthesis. Disc desiccation with mild disc bulge. Superimposed tiny central disc protrusion with slight superior migration. Moderate bilateral facet hypertrophy. Resultant mild left greater than right lateral recess stenosis. Central canal remains patent. Moderate left with mild to moderate right L4 foraminal narrowing. L5-S1: Negative interspace. Mild facet hypertrophy. No spinal stenosis. Foramina remain patent. IMPRESSION: 1. Abnormal edema involving the partially visualized right piriformis muscle, with small volume free fluid within the adjacent presacral space. Findings are nonspecific, but could reflect sequelae of recent trauma with muscular injury/strain. Correlation with history and physical exam recommended.  This finding could contribute to right lower extremity symptoms given its close proximity to the proximal right sciatic nerve. 2. Multilevel lumbar spondylosis with resultant mild diffuse canal and lateral recess stenosis at L1-2 through L4-5. Associated mild to moderate bilateral L1 through L4 foraminal narrowing as delineated above. Electronically Signed   By: Jeannine Boga M.D.   On: 10/04/2022 04:14   MR BRAIN WO CONTRAST  Result Date: 09/30/2022 CLINICAL DATA:  Delirium EXAM: MRI HEAD WITHOUT CONTRAST TECHNIQUE: Multiplanar, multiecho pulse sequences of the brain and surrounding structures were obtained without intravenous contrast. COMPARISON:  11/06/2020 FINDINGS: Brain: No acute infarct, mass effect or extra-axial collection. No chronic microhemorrhage or siderosis. There is multifocal hyperintense T2-weighted signal within the white matter. Generalized volume loss. Old right cerebellar and right thalamic small vessel infarcts. The midline structures are normal. Vascular: Normal flow voids. Skull and upper cervical spine: Normal marrow signal. Sinuses/Orbits: Negative. Other: None. IMPRESSION: 1. No acute intracranial abnormality. 2. Old right cerebellar and right thalamic small vessel infarct and findings of chronic small vessel ischemia. Electronically Signed   By: Ulyses Jarred M.D.   On: 09/30/2022 21:07   EEG adult  Result Date: 09/29/2022 Lora Havens, MD     09/29/2022  7:43 PM Patient Name: Evan [name] MRN: [id number] Epilepsy Attending: Lora Havens Referring Physician/Provider: Amie Portland, MD Date: 09/29/2022 Duration: 24.48 mins Patient history: 67yo M with ams. EEG to evaluate for seizure Level of alertness: Awake AEDs during EEG study: None Technical aspects: This EEG study was done with scalp electrodes positioned according to the 10-20 International system of electrode placement. Electrical activity was reviewed with band pass filter of 1-'70Hz'$ , sensitivity of 7  uV/mm, display speed of 65m/sec with a '60Hz'$  notched filter applied as appropriate. EEG data were recorded continuously and digitally stored.  Video monitoring was available and reviewed as appropriate. Description: No clear posterior dominant rhythm was seen. EEG showed continuous generalized predominantly 5-'7Hz'$  Hz theta slowing admixed with intermittent generalized rhythmic 2-'3Hz'$  delta slowing. Hyperventilation and photic stimulation were not performed.   ABNORMALITY - Continuous slow, generalized IMPRESSION: This study is suggestive of moderate diffuse encephalopathy, nonspecific etiology. No seizures or epileptiform discharges were seen throughout the recording. PLora Havens  ECHOCARDIOGRAM COMPLETE  Result Date: 09/27/2022    ECHOCARDIOGRAM REPORT   Patient Name:   Evan BELEWDate of Exam: 09/27/2022 Medical Rec #:  [id number]       Height:       65.0 in Accession #:    [id number]      Weight:       186.7 lb Date of Birth:  04/19/1956         BSA:          1.921 m Patient Age:    75 years         BP:           82/52 mmHg Patient Gender: M                HR:           67 bpm. Exam Location:  [facility] Procedure: 2D Echo, Color Doppler and Cardiac Doppler Indications:     Murmur R01.1  History:         Patient has prior history of Echocardiogram examinations, most                  recent 11/07/2020. CAD and Previous Myocardial Infarction, COPD;                  Risk Factors:Diabetes.  Sonographer:     Sherrie Sport Referring Phys:  [id number] Emeterio Reeve Diagnosing Phys: Kate Sable MD IMPRESSIONS  1. Left ventricular ejection fraction, by estimation, is 50 to 55%. The left ventricle has low normal function. The left ventricle has no regional wall motion abnormalities. There is mild left ventricular hypertrophy. Left ventricular diastolic parameters are indeterminate. There is moderate hypokinesis of the left ventricular, entire inferolateral wall.  2. Right ventricular systolic function is normal.  The right ventricular size is normal.  3. Left atrial size was moderately dilated.  4. The mitral valve is normal in structure. Mild mitral valve regurgitation.  5. Mean aortic valve gradient 88mHg, PG 3632mg, Vmax 32m80m DVI=0.29. The aortic valve is calcified. Aortic valve regurgitation is mild. Moderate aortic valve stenosis. FINDINGS  Left Ventricle: Left ventricular ejection fraction, by estimation, is 50 to 55%. The left ventricle has low normal function. The left ventricle has no regional wall motion abnormalities. Moderate hypokinesis of the left ventricular, entire inferolateral  wall. The left ventricular internal cavity size was normal in size. There is mild left ventricular hypertrophy. Left ventricular diastolic parameters are indeterminate. Right Ventricle: The right ventricular size is normal. No increase in right ventricular wall thickness. Right ventricular systolic function is normal. Left Atrium: Left atrial size was moderately dilated. Right Atrium: Right atrial size was normal in size. Pericardium: There is no evidence of pericardial effusion. Mitral Valve: The mitral valve is normal in structure. Mild mitral valve regurgitation. Tricuspid Valve: The tricuspid valve is not well visualized. Tricuspid valve regurgitation is not demonstrated. Aortic Valve: Mean aortic valve gradient 84m69m PG 36mm61mVmax 32m/s,78mI=0.29. The aortic valve is calcified. Aortic valve regurgitation is mild. Aortic regurgitation PHT measures 581 msec. Moderate aortic stenosis is present. Aortic valve mean gradient measures 17.7 mmHg. Aortic valve peak gradient measures 31.8 mmHg. Aortic valve area, by VTI measures 1.04 cm. Pulmonic Valve: The pulmonic valve was not well visualized. Pulmonic valve regurgitation is not visualized. Aorta: The aortic root is normal in size and structure. Venous: The inferior vena cava was not well visualized. IAS/Shunts: No atrial level shunt detected by color flow Doppler.  LEFT  VENTRICLE PLAX 2D LVIDd:         4.10 cm LVIDs:         2.90 cm LV PW:         1.10 cm LV IVS:        1.50 cm LVOT diam:     2.00 cm LV SV:         55 LV  SV Index:   28 LVOT Area:     3.14 cm  RIGHT VENTRICLE RV Basal diam:  3.10 cm RV Mid diam:    2.10 cm RV S prime:     11.40 cm/s TAPSE (M-mode): 1.8 cm LEFT ATRIUM             Index        RIGHT ATRIUM           Index LA diam:        4.70 cm 2.45 cm/m   RA Area:     16.30 cm LA Vol (A2C):   81.3 ml 42.32 ml/m  RA Volume:   40.30 ml  20.98 ml/m LA Vol (A4C):   88.5 ml 46.06 ml/m LA Biplane Vol: 85.7 ml 44.61 ml/m  AORTIC VALVE AV Area (Vmax):    0.86 cm AV Area (Vmean):   0.88 cm AV Area (VTI):     1.04 cm AV Vmax:           282.00 cm/s AV Vmean:          194.000 cm/s AV VTI:            0.527 m AV Peak Grad:      31.8 mmHg AV Mean Grad:      17.7 mmHg LVOT Vmax:         77.40 cm/s LVOT Vmean:        54.200 cm/s LVOT VTI:          0.174 m LVOT/AV VTI ratio: 0.33 AI PHT:            581 msec  AORTA Ao Root diam: 2.70 cm MITRAL VALVE               TRICUSPID VALVE MV Area (PHT): 5.88 cm    TR Peak grad:   17.6 mmHg MV Decel Time: 129 msec    TR Vmax:        210.00 cm/s MV E velocity: 98.00 cm/s                            SHUNTS                            Systemic VTI:  0.17 m                            Systemic Diam: 2.00 cm Kate Sable MD Electronically signed by Kate Sable MD Signature Date/Time: 09/27/2022/6:52:49 PM    Final    DG Chest Port 1 View  Result Date: 09/27/2022 CLINICAL DATA:  Shortness of breath, history COPD, hypertension, CHF, diabetes mellitus EXAM: PORTABLE CHEST 1 VIEW COMPARISON:  Portable exam 1211 hours compared to 09/26/2022 FINDINGS: Borderline enlargement of cardiac silhouette post CABG. Mediastinal contours and pulmonary vascularity normal. Eventration RIGHT diaphragm unchanged. No definite infiltrate, pleural effusion, or pneumothorax. Osseous structures unremarkable. IMPRESSION: No acute abnormalities.  Electronically Signed   By: Lavonia Dana M.D.   On: 09/27/2022 13:39   CT HEAD WO CONTRAST (5MM)  Result Date: 09/26/2022 CLINICAL DATA:  Altered mental status EXAM: CT HEAD WITHOUT CONTRAST TECHNIQUE: Contiguous axial images were obtained from the base of the skull through the vertex without intravenous contrast. RADIATION DOSE REDUCTION: This exam was performed according to the departmental dose-optimization program which includes automated exposure control, adjustment of the mA and/or  kV according to patient size and/or use of iterative reconstruction technique. COMPARISON:  None Available. FINDINGS: Brain: There is no mass, hemorrhage or extra-axial collection. The size and configuration of the ventricles and extra-axial CSF spaces are normal. There is hypoattenuation of the white matter, most commonly indicating chronic small vessel disease. Old small vessel infarct of the right thalamus. Vascular: Atherosclerotic calcification of the internal carotid arteries at the skull base. No abnormal hyperdensity of the major intracranial arteries or dural venous sinuses. Skull: The visualized skull base, calvarium and extracranial soft tissues are normal. Sinuses/Orbits: No fluid levels or advanced mucosal thickening of the visualized paranasal sinuses. No mastoid or middle ear effusion. The orbits are normal. IMPRESSION: 1. No acute intracranial abnormality. 2. Chronic small vessel disease and old small vessel infarct of the right thalamus. Electronically Signed   By: Ulyses Jarred M.D.   On: 09/26/2022 20:54   CT ABDOMEN PELVIS W CONTRAST  Result Date: 09/26/2022 CLINICAL DATA:  Dizziness, weakness, diarrhea, found down EXAM: CT ABDOMEN AND PELVIS WITH CONTRAST TECHNIQUE: Multidetector CT imaging of the abdomen and pelvis was performed using the standard protocol following bolus administration of intravenous contrast. RADIATION DOSE REDUCTION: This exam was performed according to the departmental  dose-optimization program which includes automated exposure control, adjustment of the mA and/or kV according to patient size and/or use of iterative reconstruction technique. CONTRAST:  129m OMNIPAQUE IOHEXOL 300 MG/ML  SOLN COMPARISON:  08/31/2021 FINDINGS: Lower chest: No acute pleural or parenchymal lung disease. Hepatobiliary: Hepatic steatosis. No focal liver abnormality. Cholelithiasis without cholecystitis. Pancreas: Unremarkable. No pancreatic ductal dilatation or surrounding inflammatory changes. Spleen: Normal in size without focal abnormality. Adrenals/Urinary Tract: Stable appearance of the bilateral kidneys. No urinary tract calculi or obstructive uropathy. Bladder is moderately distended without filling defect or focal abnormality. The adrenals are stable. Stomach/Bowel: No bowel obstruction or ileus. No bowel wall thickening or inflammatory change. Vascular/Lymphatic: Aortic atherosclerosis. No enlarged abdominal or pelvic lymph nodes. Reproductive: Prostate is unremarkable. Other: No free fluid or free intraperitoneal gas. Fat containing umbilical hernia unchanged. No bowel herniation. Musculoskeletal: No acute or destructive bony lesions. Reconstructed images demonstrate no additional findings. IMPRESSION: 1. Cholelithiasis without evidence of acute cholecystitis. 2. Fat containing umbilical hernia. 3. Otherwise stable appearance of the abdomen and pelvis without acute finding. 4.  Aortic Atherosclerosis (ICD10-I70.0). Electronically Signed   By: MRanda NgoM.D.   On: 09/26/2022 15:05   DG Chest Portable 1 View  Result Date: 09/26/2022 CLINICAL DATA:  Weakness. EXAM: PORTABLE CHEST 1 VIEW COMPARISON:  Chest x-ray February 5, 24. FINDINGS: Chronically elevated right hemidiaphragm. No definite consolidation. No visible pleural effusions or pneumothorax. No evidence of acute osseous abnormality. Similar cardiomediastinal silhouette. CABG and median sternotomy. IMPRESSION: No active disease.  Electronically Signed   By: FMargaretha SheffieldM.D.   On: 09/26/2022 12:44   CT HEAD WO CONTRAST (5MM)  Result Date: 09/11/2022 CLINICAL DATA:  Minor head trauma EXAM: CT HEAD WITHOUT CONTRAST TECHNIQUE: Contiguous axial images were obtained from the base of the skull through the vertex without intravenous contrast. RADIATION DOSE REDUCTION: This exam was performed according to the departmental dose-optimization program which includes automated exposure control, adjustment of the mA and/or kV according to patient size and/or use of iterative reconstruction technique. COMPARISON:  10/29/2021 FINDINGS: Brain: No evidence of acute infarction, hemorrhage, hydrocephalus, extra-axial collection or mass lesion/mass effect. Chronic lacunar infarct at the right medial thalamus. Mild for age cerebral volume loss that is generalized Vascular: No hyperdense vessel or  unexpected calcification. Skull: Normal. Negative for fracture or focal lesion. Sinuses/Orbits: No acute finding. IMPRESSION: No evidence of intracranial injury.  Stable head CT. Electronically Signed   By: Jorje Guild M.D.   On: 09/11/2022 09:17   DG Chest Portable 1 View  Result Date: 09/11/2022 CLINICAL DATA:  Weakness, low back pain, 2 falls, brief loss of consciousness EXAM: PORTABLE CHEST 1 VIEW COMPARISON:  12/29/2021 FINDINGS: Upper normal heart size post CABG. Mediastinal contours and pulmonary vascularity normal. Minimal bibasilar atelectasis. Lungs otherwise clear. No infiltrate, pleural effusion, or pneumothorax. IMPRESSION: Minimal bibasilar atelectasis. Aortic Atherosclerosis (ICD10-I70.0). Electronically Signed   By: Lavonia Dana M.D.   On: 09/11/2022 09:09   DG Lumbar Spine 2-3 Views  Result Date: 09/11/2022 CLINICAL DATA:  Weakness, low back pain, 2 falls EXAM: LUMBAR SPINE - 2-3 VIEW COMPARISON:  None Available. FINDINGS: Six non-rib-bearing lumbar type vertebra. Diffuse osseous demineralization. Scattered disc space narrowing and  endplate spur formation upper lumbar spine. Vertebral body heights maintained without fracture or subluxation. No bone destruction or gross spondylolysis. SI joints preserved. Atherosclerotic calcifications aorta and iliac arteries. IMPRESSION: Osseous demineralization with degenerative disc disease changes lumbar spine. No acute abnormalities. Aortic Atherosclerosis (ICD10-I70.0). Electronically Signed   By: Lavonia Dana M.D.   On: 09/11/2022 09:08      Labs: BNP (last 3 results) Recent Labs    11/10/21 0936 09/27/22 1138 09/28/22 0620  BNP 220.4* 428.7* [id number]*   Basic Metabolic Panel: Recent Labs  Lab 09/30/22 0445 10/02/22 0549  NA 135 136  K 3.4* 4.0  CL 106 106  CO2 22 25  GLUCOSE 113* 121*  BUN 22 17  CREATININE 0.72 0.70  CALCIUM 8.5* 8.3*   Liver Function Tests: No results for input(s): "AST", "ALT", "ALKPHOS", "BILITOT", "PROT", "ALBUMIN" in the last 168 hours. No results for input(s): "LIPASE", "AMYLASE" in the last 168 hours. Recent Labs  Lab 09/30/22 0445  AMMONIA 15   CBC: Recent Labs  Lab 09/30/22 0445 10/02/22 0549  WBC 11.8* 11.4*  HGB 11.6* 10.8*  HCT 36.0* 33.9*  MCV 89.3 89.7  PLT 391 380   Cardiac Enzymes: No results for input(s): "CKTOTAL", "CKMB", "CKMBINDEX", "TROPONINI" in the last 168 hours. BNP: Invalid input(s): "POCBNP" CBG: Recent Labs  Lab 10/05/22 1555 10/05/22 2121 10/06/22 0819 10/06/22 1138 10/06/22 1645  GLUCAP 114* 130* 116* 139* 128*   D-Dimer No results for input(s): "DDIMER" in the last 72 hours. Hgb A1c No results for input(s): "HGBA1C" in the last 72 hours. Lipid Profile No results for input(s): "CHOL", "HDL", "LDLCALC", "TRIG", "CHOLHDL", "LDLDIRECT" in the last 72 hours. Thyroid function studies No results for input(s): "TSH", "T4TOTAL", "T3FREE", "THYROIDAB" in the last 72 hours.  Invalid input(s): "FREET3" Anemia work up No results for input(s): "VITAMINB12", "FOLATE", "FERRITIN", "TIBC", "IRON",  "RETICCTPCT" in the last 72 hours. Urinalysis    Component Value Date/Time   COLORURINE YELLOW (A) 09/26/2022 1117   APPEARANCEUR HAZY (A) 09/26/2022 1117   APPEARANCEUR Clear 09/20/2021 0856   LABSPEC 1.013 09/26/2022 1117   LABSPEC 1.013 11/29/2014 1120   PHURINE 5.0 09/26/2022 1117   GLUCOSEU >=500 (A) 09/26/2022 1117   GLUCOSEU Negative 11/29/2014 1120   HGBUR LARGE (A) 09/26/2022 1117   BILIRUBINUR NEGATIVE 09/26/2022 1117   BILIRUBINUR Negative 09/20/2021 0856   BILIRUBINUR Negative 11/29/2014 1120   KETONESUR NEGATIVE 09/26/2022 1117   PROTEINUR 30 (A) 09/26/2022 1117   NITRITE NEGATIVE 09/26/2022 1117   LEUKOCYTESUR NEGATIVE 09/26/2022 1117   LEUKOCYTESUR Negative 11/29/2014 1120  Sepsis Labs Recent Labs  Lab 09/30/22 0445 10/02/22 0549  WBC 11.8* 11.4*   Microbiology Recent Results (from the past 240 hour(s))  MRSA Next Gen by PCR, Nasal     Status: None   Collection Time: 09/27/22 12:08 PM   Specimen: Nasal Mucosa; Nasal Swab  Result Value Ref Range Status   MRSA by PCR Next Gen NOT DETECTED NOT DETECTED Final    Comment: (NOTE) The GeneXpert MRSA Assay (FDA approved for NASAL specimens only), is one component of a comprehensive MRSA colonization surveillance program. It is not intended to diagnose MRSA infection nor to guide or monitor treatment for MRSA infections. Test performance is not FDA approved in patients less than 20 years old. Performed at [facility], [address] [place], [place] [address]      Total time spend on discharging this patient, including the last patient exam, discussing the hospital stay, instructions for ongoing care as it relates to all pertinent caregivers, as well as preparing the medical discharge records, prescriptions, and/or referrals as applicable, is 35 minutes.    Enzo Bi, MD  [facility] 10/06/2022, 4:54 PM

## 2022-11-06 DIAGNOSIS — I959 Hypotension, unspecified: Secondary | ICD-10-CM | POA: Diagnosis not present

## 2022-11-06 DIAGNOSIS — E11311 Type 2 diabetes mellitus with unspecified diabetic retinopathy with macular edema: Secondary | ICD-10-CM | POA: Diagnosis not present

## 2022-11-09 DIAGNOSIS — S31829A Unspecified open wound of left buttock, initial encounter: Secondary | ICD-10-CM | POA: Diagnosis not present

## 2022-11-09 DIAGNOSIS — L89322 Pressure ulcer of left buttock, stage 2: Secondary | ICD-10-CM | POA: Diagnosis not present

## 2022-11-10 DIAGNOSIS — L98412 Non-pressure chronic ulcer of buttock with fat layer exposed: Secondary | ICD-10-CM | POA: Diagnosis not present

## 2022-11-10 DIAGNOSIS — L089 Local infection of the skin and subcutaneous tissue, unspecified: Secondary | ICD-10-CM | POA: Diagnosis not present

## 2022-11-14 DIAGNOSIS — M6281 Muscle weakness (generalized): Secondary | ICD-10-CM | POA: Diagnosis not present

## 2022-11-14 DIAGNOSIS — L98419 Non-pressure chronic ulcer of buttock with unspecified severity: Secondary | ICD-10-CM | POA: Diagnosis not present

## 2022-11-14 DIAGNOSIS — E119 Type 2 diabetes mellitus without complications: Secondary | ICD-10-CM | POA: Diagnosis not present

## 2022-11-14 DIAGNOSIS — J449 Chronic obstructive pulmonary disease, unspecified: Secondary | ICD-10-CM | POA: Diagnosis not present

## 2022-11-16 DIAGNOSIS — R634 Abnormal weight loss: Secondary | ICD-10-CM | POA: Diagnosis not present

## 2022-11-21 DIAGNOSIS — L089 Local infection of the skin and subcutaneous tissue, unspecified: Secondary | ICD-10-CM | POA: Diagnosis not present

## 2022-11-21 DIAGNOSIS — S31829A Unspecified open wound of left buttock, initial encounter: Secondary | ICD-10-CM | POA: Diagnosis not present

## 2022-11-21 DIAGNOSIS — L98413 Non-pressure chronic ulcer of buttock with necrosis of muscle: Secondary | ICD-10-CM | POA: Diagnosis not present

## 2022-11-22 DIAGNOSIS — Z8673 Personal history of transient ischemic attack (TIA), and cerebral infarction without residual deficits: Secondary | ICD-10-CM | POA: Diagnosis not present

## 2022-11-22 DIAGNOSIS — R110 Nausea: Secondary | ICD-10-CM | POA: Diagnosis not present

## 2022-11-22 DIAGNOSIS — R42 Dizziness and giddiness: Secondary | ICD-10-CM | POA: Diagnosis not present

## 2022-11-22 DIAGNOSIS — R531 Weakness: Secondary | ICD-10-CM | POA: Diagnosis not present

## 2022-11-22 DIAGNOSIS — R251 Tremor, unspecified: Secondary | ICD-10-CM | POA: Diagnosis not present

## 2022-11-22 DIAGNOSIS — R413 Other amnesia: Secondary | ICD-10-CM | POA: Diagnosis not present

## 2022-11-23 DIAGNOSIS — S31829A Unspecified open wound of left buttock, initial encounter: Secondary | ICD-10-CM | POA: Diagnosis not present

## 2022-11-23 DIAGNOSIS — E860 Dehydration: Secondary | ICD-10-CM | POA: Diagnosis not present

## 2022-11-23 DIAGNOSIS — L089 Local infection of the skin and subcutaneous tissue, unspecified: Secondary | ICD-10-CM | POA: Diagnosis not present

## 2022-11-24 ENCOUNTER — Inpatient Hospital Stay
Admission: EM | Admit: 2022-11-24 | Discharge: 2022-11-30 | DRG: 853 | Disposition: A | Discharge status: Skilled Nursing Facility | Payer: 59 | Attending: Student in an Organized Health Care Education/Training Program | Admitting: Student in an Organized Health Care Education/Training Program

## 2022-11-24 ENCOUNTER — Emergency Department: Payer: 59

## 2022-11-24 ENCOUNTER — Other Ambulatory Visit: Payer: Self-pay

## 2022-11-24 ENCOUNTER — Encounter: Payer: Self-pay | Admitting: Emergency Medicine

## 2022-11-24 DIAGNOSIS — L89159 Pressure ulcer of sacral region, unspecified stage: Secondary | ICD-10-CM | POA: Diagnosis not present

## 2022-11-24 DIAGNOSIS — D62 Acute posthemorrhagic anemia: Secondary | ICD-10-CM | POA: Diagnosis not present

## 2022-11-24 DIAGNOSIS — E785 Hyperlipidemia, unspecified: Secondary | ICD-10-CM | POA: Diagnosis present

## 2022-11-24 DIAGNOSIS — R000 Tachycardia, unspecified: Secondary | ICD-10-CM | POA: Diagnosis not present

## 2022-11-24 DIAGNOSIS — Z79899 Other long term (current) drug therapy: Secondary | ICD-10-CM

## 2022-11-24 DIAGNOSIS — R059 Cough, unspecified: Secondary | ICD-10-CM | POA: Diagnosis not present

## 2022-11-24 DIAGNOSIS — G8929 Other chronic pain: Secondary | ICD-10-CM | POA: Diagnosis present

## 2022-11-24 DIAGNOSIS — L89156 Pressure-induced deep tissue damage of sacral region: Secondary | ICD-10-CM | POA: Diagnosis not present

## 2022-11-24 DIAGNOSIS — F200 Paranoid schizophrenia: Secondary | ICD-10-CM | POA: Diagnosis present

## 2022-11-24 DIAGNOSIS — I5032 Chronic diastolic (congestive) heart failure: Secondary | ICD-10-CM | POA: Diagnosis not present

## 2022-11-24 DIAGNOSIS — M79661 Pain in right lower leg: Secondary | ICD-10-CM | POA: Diagnosis present

## 2022-11-24 DIAGNOSIS — I9589 Other hypotension: Secondary | ICD-10-CM | POA: Diagnosis not present

## 2022-11-24 DIAGNOSIS — J90 Pleural effusion, not elsewhere classified: Secondary | ICD-10-CM | POA: Diagnosis not present

## 2022-11-24 DIAGNOSIS — J4489 Other specified chronic obstructive pulmonary disease: Secondary | ICD-10-CM | POA: Diagnosis present

## 2022-11-24 DIAGNOSIS — E1169 Type 2 diabetes mellitus with other specified complication: Secondary | ICD-10-CM | POA: Diagnosis not present

## 2022-11-24 DIAGNOSIS — Z83438 Family history of other disorder of lipoprotein metabolism and other lipidemia: Secondary | ICD-10-CM

## 2022-11-24 DIAGNOSIS — M869 Osteomyelitis, unspecified: Secondary | ICD-10-CM

## 2022-11-24 DIAGNOSIS — F4310 Post-traumatic stress disorder, unspecified: Secondary | ICD-10-CM | POA: Diagnosis present

## 2022-11-24 DIAGNOSIS — K219 Gastro-esophageal reflux disease without esophagitis: Secondary | ICD-10-CM | POA: Diagnosis present

## 2022-11-24 DIAGNOSIS — I252 Old myocardial infarction: Secondary | ICD-10-CM

## 2022-11-24 DIAGNOSIS — L89154 Pressure ulcer of sacral region, stage 4: Secondary | ICD-10-CM | POA: Diagnosis present

## 2022-11-24 DIAGNOSIS — M8610 Other acute osteomyelitis, unspecified site: Secondary | ICD-10-CM | POA: Diagnosis not present

## 2022-11-24 DIAGNOSIS — K224 Dyskinesia of esophagus: Secondary | ICD-10-CM | POA: Diagnosis present

## 2022-11-24 DIAGNOSIS — I959 Hypotension, unspecified: Secondary | ICD-10-CM | POA: Diagnosis present

## 2022-11-24 DIAGNOSIS — E11622 Type 2 diabetes mellitus with other skin ulcer: Secondary | ICD-10-CM | POA: Diagnosis not present

## 2022-11-24 DIAGNOSIS — Z7901 Long term (current) use of anticoagulants: Secondary | ICD-10-CM

## 2022-11-24 DIAGNOSIS — Z7982 Long term (current) use of aspirin: Secondary | ICD-10-CM

## 2022-11-24 DIAGNOSIS — L259 Unspecified contact dermatitis, unspecified cause: Secondary | ICD-10-CM | POA: Diagnosis not present

## 2022-11-24 DIAGNOSIS — Z7984 Long term (current) use of oral hypoglycemic drugs: Secondary | ICD-10-CM

## 2022-11-24 DIAGNOSIS — Z882 Allergy status to sulfonamides status: Secondary | ICD-10-CM

## 2022-11-24 DIAGNOSIS — E1140 Type 2 diabetes mellitus with diabetic neuropathy, unspecified: Secondary | ICD-10-CM | POA: Diagnosis not present

## 2022-11-24 DIAGNOSIS — Z880 Allergy status to penicillin: Secondary | ICD-10-CM

## 2022-11-24 DIAGNOSIS — E872 Acidosis, unspecified: Secondary | ICD-10-CM | POA: Insufficient documentation

## 2022-11-24 DIAGNOSIS — Z888 Allergy status to other drugs, medicaments and biological substances status: Secondary | ICD-10-CM

## 2022-11-24 DIAGNOSIS — K5900 Constipation, unspecified: Secondary | ICD-10-CM | POA: Diagnosis not present

## 2022-11-24 DIAGNOSIS — E876 Hypokalemia: Secondary | ICD-10-CM | POA: Diagnosis present

## 2022-11-24 DIAGNOSIS — Z8249 Family history of ischemic heart disease and other diseases of the circulatory system: Secondary | ICD-10-CM

## 2022-11-24 DIAGNOSIS — Z8673 Personal history of transient ischemic attack (TIA), and cerebral infarction without residual deficits: Secondary | ICD-10-CM

## 2022-11-24 DIAGNOSIS — I69398 Other sequelae of cerebral infarction: Secondary | ICD-10-CM | POA: Diagnosis not present

## 2022-11-24 DIAGNOSIS — Z7951 Long term (current) use of inhaled steroids: Secondary | ICD-10-CM

## 2022-11-24 DIAGNOSIS — I2510 Atherosclerotic heart disease of native coronary artery without angina pectoris: Secondary | ICD-10-CM | POA: Diagnosis present

## 2022-11-24 DIAGNOSIS — Z6825 Body mass index (BMI) 25.0-25.9, adult: Secondary | ICD-10-CM

## 2022-11-24 DIAGNOSIS — Z7401 Bed confinement status: Secondary | ICD-10-CM | POA: Diagnosis not present

## 2022-11-24 DIAGNOSIS — R251 Tremor, unspecified: Secondary | ICD-10-CM | POA: Diagnosis present

## 2022-11-24 DIAGNOSIS — R413 Other amnesia: Secondary | ICD-10-CM | POA: Diagnosis present

## 2022-11-24 DIAGNOSIS — E11319 Type 2 diabetes mellitus with unspecified diabetic retinopathy without macular edema: Secondary | ICD-10-CM | POA: Diagnosis not present

## 2022-11-24 DIAGNOSIS — G4733 Obstructive sleep apnea (adult) (pediatric): Secondary | ICD-10-CM

## 2022-11-24 DIAGNOSIS — Z743 Need for continuous supervision: Secondary | ICD-10-CM | POA: Diagnosis not present

## 2022-11-24 DIAGNOSIS — I152 Hypertension secondary to endocrine disorders: Secondary | ICD-10-CM | POA: Diagnosis not present

## 2022-11-24 DIAGNOSIS — J449 Chronic obstructive pulmonary disease, unspecified: Secondary | ICD-10-CM | POA: Diagnosis not present

## 2022-11-24 DIAGNOSIS — I4819 Other persistent atrial fibrillation: Secondary | ICD-10-CM | POA: Diagnosis not present

## 2022-11-24 DIAGNOSIS — E1151 Type 2 diabetes mellitus with diabetic peripheral angiopathy without gangrene: Secondary | ICD-10-CM | POA: Diagnosis not present

## 2022-11-24 DIAGNOSIS — A419 Sepsis, unspecified organism: Secondary | ICD-10-CM | POA: Diagnosis not present

## 2022-11-24 DIAGNOSIS — E663 Overweight: Secondary | ICD-10-CM | POA: Diagnosis present

## 2022-11-24 DIAGNOSIS — F423 Hoarding disorder: Secondary | ICD-10-CM | POA: Diagnosis present

## 2022-11-24 DIAGNOSIS — E1159 Type 2 diabetes mellitus with other circulatory complications: Secondary | ICD-10-CM | POA: Diagnosis present

## 2022-11-24 DIAGNOSIS — Z955 Presence of coronary angioplasty implant and graft: Secondary | ICD-10-CM

## 2022-11-24 DIAGNOSIS — M4628 Osteomyelitis of vertebra, sacral and sacrococcygeal region: Secondary | ICD-10-CM | POA: Diagnosis present

## 2022-11-24 DIAGNOSIS — R6889 Other general symptoms and signs: Secondary | ICD-10-CM | POA: Diagnosis not present

## 2022-11-24 DIAGNOSIS — I25810 Atherosclerosis of coronary artery bypass graft(s) without angina pectoris: Secondary | ICD-10-CM | POA: Diagnosis present

## 2022-11-24 DIAGNOSIS — M8618 Other acute osteomyelitis, other site: Secondary | ICD-10-CM | POA: Diagnosis not present

## 2022-11-24 DIAGNOSIS — I499 Cardiac arrhythmia, unspecified: Secondary | ICD-10-CM | POA: Diagnosis not present

## 2022-11-24 DIAGNOSIS — M6281 Muscle weakness (generalized): Secondary | ICD-10-CM | POA: Diagnosis not present

## 2022-11-24 DIAGNOSIS — Z951 Presence of aortocoronary bypass graft: Secondary | ICD-10-CM

## 2022-11-24 DIAGNOSIS — M79662 Pain in left lower leg: Secondary | ICD-10-CM | POA: Diagnosis present

## 2022-11-24 DIAGNOSIS — R531 Weakness: Secondary | ICD-10-CM | POA: Diagnosis not present

## 2022-11-24 DIAGNOSIS — Z8679 Personal history of other diseases of the circulatory system: Secondary | ICD-10-CM

## 2022-11-24 LAB — LIPASE, BLOOD: Lipase: 29 U/L (ref 11–51)

## 2022-11-24 LAB — URINALYSIS, ROUTINE W REFLEX MICROSCOPIC
Bacteria, UA: NONE SEEN
Bilirubin Urine: NEGATIVE
Glucose, UA: >=500 mg/dL — AB
Hgb urine dipstick: NEGATIVE
Ketones, ur: NEGATIVE mg/dL
Leukocytes,Ua: NEGATIVE
Nitrite: NEGATIVE
Protein, ur: NEGATIVE mg/dL
Specific Gravity, Urine: 1.026 (ref 1.005–1.030)
pH: 5 (ref 5.0–8.0)

## 2022-11-24 LAB — COMPREHENSIVE METABOLIC PANEL
ALT: 15 U/L (ref 0–44)
AST: 27 U/L (ref 15–41)
Albumin: 2.2 g/dL — ABNORMAL LOW (ref 3.5–5.0)
Alkaline Phosphatase: 153 U/L — ABNORMAL HIGH (ref 38–126)
Anion gap: 7 (ref 5–15)
BUN: 12 mg/dL (ref 8–23)
CO2: 23 mmol/L (ref 22–32)
Calcium: 8.2 mg/dL — ABNORMAL LOW (ref 8.9–10.3)
Chloride: 109 mmol/L (ref 98–111)
Creatinine, Ser: 0.72 mg/dL (ref 0.61–1.24)
GFR, Estimated: >60 mL/min (ref >60)
Glucose, Bld: 128 mg/dL — ABNORMAL HIGH (ref 70–99)
Potassium: 3.4 mmol/L — ABNORMAL LOW (ref 3.5–5.1)
Sodium: 139 mmol/L (ref 135–145)
Total Bilirubin: 0.6 mg/dL (ref 0.3–1.2)
Total Protein: 5.8 g/dL — ABNORMAL LOW (ref 6.5–8.1)

## 2022-11-24 LAB — CBC
HCT: 34 % — ABNORMAL LOW (ref 39.0–52.0)
Hemoglobin: 10.3 g/dL — ABNORMAL LOW (ref 13.0–17.0)
MCH: 26.5 pg (ref 26.0–34.0)
MCHC: 30.3 g/dL (ref 30.0–36.0)
MCV: 87.4 fL (ref 80.0–100.0)
Platelets: 430 10*3/uL — ABNORMAL HIGH (ref 150–400)
RBC: 3.89 MIL/uL — ABNORMAL LOW (ref 4.22–5.81)
RDW: 16.6 % — ABNORMAL HIGH (ref 11.5–15.5)
WBC: 11.5 10*3/uL — ABNORMAL HIGH (ref 4.0–10.5)
nRBC: 0 % (ref 0.0–0.2)

## 2022-11-24 LAB — CBG MONITORING, ED
Glucose-Capillary: 96 mg/dL (ref 70–99)
Glucose-Capillary: 95 mg/dL (ref 70–99)

## 2022-11-24 LAB — LACTIC ACID, PLASMA
Lactic Acid, Venous: 1.7 mmol/L (ref 0.5–1.9)
Lactic Acid, Venous: 1.7 mmol/L (ref 0.5–1.9)

## 2022-11-24 LAB — PROCALCITONIN: Procalcitonin: 0.11 ng/mL

## 2022-11-24 MED ORDER — SODIUM CHLORIDE 0.9 % IV SOLN
20.0000 mg/kg | Freq: Once | INTRAVENOUS | Status: DC
Start: 2022-11-24 — End: 2022-11-24

## 2022-11-24 MED ORDER — MELATONIN 5 MG PO TABS: 5.0000 mg | ORAL_TABLET | Freq: Every evening | ORAL | Status: DC | PRN

## 2022-11-24 MED ORDER — LACTATED RINGERS IV BOLUS (SEPSIS)
500.0000 mL | Freq: Once | INTRAVENOUS | Status: AC
  Administered 2022-11-24: 500 mL via INTRAVENOUS

## 2022-11-24 MED ORDER — ENSURE ENLIVE PO LIQD
237.0000 mL | Freq: Two times a day (BID) | ORAL | Status: DC
  Administered 2022-11-26 – 2022-11-30 (×10): 237 mL via ORAL

## 2022-11-24 MED ORDER — NYSTATIN 100000 UNIT/GM EX CREA: TOPICAL_CREAM | Freq: Two times a day (BID) | CUTANEOUS | Status: DC | PRN

## 2022-11-24 MED ORDER — INSULIN ASPART 100 UNIT/ML IJ SOLN: 0.0000 [IU] | Freq: Every day | INTRAMUSCULAR | Status: DC

## 2022-11-24 MED ORDER — SENNA 8.6 MG PO TABS
2.0000 | ORAL_TABLET | Freq: Every day | ORAL | Status: DC
  Administered 2022-11-25 – 2022-11-30 (×6): 17.2 mg via ORAL
  Filled 2022-11-24 (×6): qty 2

## 2022-11-24 MED ORDER — RISAQUAD PO CAPS
1.0000 | ORAL_CAPSULE | Freq: Two times a day (BID) | ORAL | Status: DC
  Administered 2022-11-24 – 2022-11-30 (×12): 1 via ORAL
  Filled 2022-11-24 (×12): qty 1

## 2022-11-24 MED ORDER — ALBUTEROL SULFATE (2.5 MG/3ML) 0.083% IN NEBU: 3.0000 mL | INHALATION_SOLUTION | Freq: Four times a day (QID) | RESPIRATORY_TRACT | Status: DC | PRN

## 2022-11-24 MED ORDER — HYDROCODONE-ACETAMINOPHEN 5-325 MG PO TABS: 1.0000 | ORAL_TABLET | Freq: Four times a day (QID) | ORAL | Status: DC | PRN

## 2022-11-24 MED ORDER — VITAMIN D 25 MCG (1000 UNIT) PO TABS
5000.0000 [IU] | ORAL_TABLET | Freq: Every day | ORAL | Status: DC
  Administered 2022-11-25 – 2022-11-30 (×6): 5000 [IU] via ORAL
  Filled 2022-11-24 (×6): qty 5

## 2022-11-24 MED ORDER — MAGNESIUM HYDROXIDE 400 MG/5ML PO SUSP: 5.0000 mL | Freq: Every day | ORAL | Status: DC | PRN

## 2022-11-24 MED ORDER — SIMVASTATIN 20 MG PO TABS
20.0000 mg | ORAL_TABLET | Freq: Every day | ORAL | Status: DC
  Administered 2022-11-24 – 2022-11-29 (×6): 20 mg via ORAL
  Filled 2022-11-24 (×2): qty 1
  Filled 2022-11-24: qty 2
  Filled 2022-11-24 (×3): qty 1

## 2022-11-24 MED ORDER — PANTOPRAZOLE SODIUM 40 MG PO TBEC
40.0000 mg | DELAYED_RELEASE_TABLET | Freq: Every day | ORAL | Status: DC
  Administered 2022-11-25 – 2022-11-30 (×6): 40 mg via ORAL
  Filled 2022-11-24 (×6): qty 1

## 2022-11-24 MED ORDER — HYDRALAZINE HCL 20 MG/ML IJ SOLN: 5.0000 mg | Freq: Three times a day (TID) | INTRAMUSCULAR | Status: AC | PRN

## 2022-11-24 MED ORDER — SENNOSIDES-DOCUSATE SODIUM 8.6-50 MG PO TABS: 1.0000 | ORAL_TABLET | Freq: Every evening | ORAL | Status: DC | PRN

## 2022-11-24 MED ORDER — ACETAMINOPHEN 650 MG RE SUPP: 650.0000 mg | Freq: Four times a day (QID) | RECTAL | Status: DC | PRN

## 2022-11-24 MED ORDER — METRONIDAZOLE 500 MG/100ML IV SOLN
500.0000 mg | Freq: Two times a day (BID) | INTRAVENOUS | Status: AC
  Administered 2022-11-24 – 2022-11-27 (×6): 500 mg via INTRAVENOUS
  Filled 2022-11-24 (×7): qty 100

## 2022-11-24 MED ORDER — MOMETASONE FURO-FORMOTEROL FUM 100-5 MCG/ACT IN AERO
2.0000 | INHALATION_SPRAY | Freq: Two times a day (BID) | RESPIRATORY_TRACT | Status: DC
  Administered 2022-11-25 – 2022-11-30 (×8): 2 via RESPIRATORY_TRACT
  Filled 2022-11-24 (×2): qty 8.8

## 2022-11-24 MED ORDER — VANCOMYCIN HCL 1750 MG/350ML IV SOLN
1750.0000 mg | Freq: Once | INTRAVENOUS | Status: AC
  Administered 2022-11-24: 1750 mg via INTRAVENOUS
  Filled 2022-11-24: qty 350

## 2022-11-24 MED ORDER — GABAPENTIN 100 MG PO CAPS
200.0000 mg | ORAL_CAPSULE | Freq: Every day | ORAL | Status: DC
  Administered 2022-11-24 – 2022-11-29 (×6): 200 mg via ORAL
  Filled 2022-11-24 (×6): qty 2

## 2022-11-24 MED ORDER — SODIUM CHLORIDE 0.9 % IV SOLN
2.0000 g | Freq: Once | INTRAVENOUS | Status: AC
  Administered 2022-11-24: 2 g via INTRAVENOUS
  Filled 2022-11-24: qty 12.5

## 2022-11-24 MED ORDER — LACTATED RINGERS IV SOLN: INTRAVENOUS | Status: DC

## 2022-11-24 MED ORDER — MIRTAZAPINE 15 MG PO TABS
7.5000 mg | ORAL_TABLET | Freq: Every day | ORAL | Status: DC
  Administered 2022-11-24 – 2022-11-29 (×6): 7.5 mg via ORAL
  Filled 2022-11-24 (×6): qty 1

## 2022-11-24 MED ORDER — SODIUM CHLORIDE 0.9 % IV SOLN
2.0000 g | Freq: Three times a day (TID) | INTRAVENOUS | Status: AC
  Administered 2022-11-24 – 2022-11-27 (×9): 2 g via INTRAVENOUS
  Filled 2022-11-24: qty 2
  Filled 2022-11-24 (×5): qty 12.5
  Filled 2022-11-24: qty 2
  Filled 2022-11-24 (×3): qty 12.5

## 2022-11-24 MED ORDER — GABAPENTIN 100 MG PO CAPS: 100.0000 mg | ORAL_CAPSULE | ORAL | Status: DC

## 2022-11-24 MED ORDER — FENTANYL CITRATE PF 50 MCG/ML IJ SOSY
50.0000 ug | PREFILLED_SYRINGE | Freq: Once | INTRAMUSCULAR | Status: AC
  Administered 2022-11-24: 50 ug via INTRAVENOUS
  Filled 2022-11-24: qty 1

## 2022-11-24 MED ORDER — ACETAMINOPHEN 325 MG PO TABS: 650.0000 mg | ORAL_TABLET | Freq: Four times a day (QID) | ORAL | Status: DC | PRN

## 2022-11-24 MED ORDER — ADULT MULTIVITAMIN W/MINERALS CH
1.0000 | ORAL_TABLET | Freq: Every day | ORAL | Status: DC
  Administered 2022-11-25 – 2022-11-30 (×6): 1 via ORAL
  Filled 2022-11-24 (×6): qty 1

## 2022-11-24 MED ORDER — GABAPENTIN 100 MG PO CAPS
100.0000 mg | ORAL_CAPSULE | Freq: Every morning | ORAL | Status: DC
  Administered 2022-11-25 – 2022-11-30 (×6): 100 mg via ORAL
  Filled 2022-11-24 (×6): qty 1

## 2022-11-24 MED ORDER — VANCOMYCIN HCL IN DEXTROSE 1-5 GM/200ML-% IV SOLN
1000.0000 mg | Freq: Two times a day (BID) | INTRAVENOUS | Status: AC
  Administered 2022-11-25 – 2022-11-27 (×6): 1000 mg via INTRAVENOUS
  Filled 2022-11-24 (×6): qty 200

## 2022-11-24 MED ORDER — VITAMIN B-12 1000 MCG PO TABS
1000.0000 ug | ORAL_TABLET | Freq: Every day | ORAL | Status: DC
  Administered 2022-11-24 – 2022-11-30 (×7): 1000 ug via ORAL
  Filled 2022-11-24 (×4): qty 1
  Filled 2022-11-24: qty 2
  Filled 2022-11-24 (×2): qty 1

## 2022-11-24 MED ORDER — ONDANSETRON HCL 4 MG/2ML IJ SOLN: 4.0000 mg | Freq: Four times a day (QID) | INTRAMUSCULAR | Status: AC | PRN

## 2022-11-24 MED ORDER — LACTATED RINGERS IV SOLN: 150.0000 mL/h | INTRAVENOUS | Status: DC

## 2022-11-24 MED ORDER — NITROGLYCERIN 0.4 MG SL SUBL: 0.4000 mg | SUBLINGUAL_TABLET | SUBLINGUAL | Status: DC | PRN

## 2022-11-24 MED ORDER — MORPHINE SULFATE (PF) 4 MG/ML IV SOLN
4.0000 mg | INTRAVENOUS | Status: AC | PRN
  Administered 2022-11-24: 4 mg via INTRAVENOUS
  Filled 2022-11-24: qty 1

## 2022-11-24 MED ORDER — MIDODRINE HCL 5 MG PO TABS
5.0000 mg | ORAL_TABLET | Freq: Three times a day (TID) | ORAL | Status: DC
  Administered 2022-11-24 – 2022-11-28 (×10): 5 mg via ORAL
  Filled 2022-11-24 (×12): qty 1

## 2022-11-24 MED ORDER — INSULIN ASPART 100 UNIT/ML IJ SOLN
0.0000 [IU] | Freq: Three times a day (TID) | INTRAMUSCULAR | Status: DC
  Administered 2022-11-26: 1 [IU] via SUBCUTANEOUS
  Administered 2022-11-26: 3 [IU] via SUBCUTANEOUS
  Administered 2022-11-27 – 2022-11-29 (×3): 2 [IU] via SUBCUTANEOUS
  Filled 2022-11-24 (×7): qty 1

## 2022-11-24 MED ORDER — RISPERIDONE 1 MG PO TABS
1.5000 mg | ORAL_TABLET | Freq: Every day | ORAL | Status: DC
  Administered 2022-11-24 – 2022-11-29 (×6): 1.5 mg via ORAL
  Filled 2022-11-24: qty 2
  Filled 2022-11-24 (×6): qty 1

## 2022-11-24 MED ORDER — SODIUM CHLORIDE 0.9 % IV BOLUS
500.0000 mL | Freq: Once | INTRAVENOUS | Status: AC
  Administered 2022-11-24: 500 mL via INTRAVENOUS

## 2022-11-24 MED ORDER — ONDANSETRON HCL 4 MG PO TABS: 4.0000 mg | ORAL_TABLET | Freq: Four times a day (QID) | ORAL | Status: AC | PRN

## 2022-11-24 NOTE — Assessment & Plan Note (Signed)
-   PPI resumed °

## 2022-11-24 NOTE — Assessment & Plan Note (Signed)
Last A1c 09/27/2022, was 6.0 Home Jardiance not resumed on admission Insulin SSI with at bedtime coverage ordered Goal inpatient blood glucose levels 140-180

## 2022-11-24 NOTE — ED Notes (Signed)
Patient noted to have large sacral wound. New sacral dressing applied at this time and new brief placed. Patient repositioned in bed.

## 2022-11-24 NOTE — H&P (Addendum)
History and Physical   Evan [name] ZOX:[id number] DOB: [birth year] DOA: 11/24/2022  PCP: Melonie Florida, FNP  Outpatient Specialists: Dr. Malvin Johns, neurology Patient coming from: [facility] via EMS  I have personally briefly reviewed patient's old medical records in [facility] EMR.  Chief Concern: Abnormal labs, concerning for sacral wound infection, lack of IV access  HPI: [name] is a 67 year old male with history of paranoid schizophrenia, hyperlipidemia, CAD, non-insulin-dependent diabetes mellitus, hypertension, obesity, diabetic retinopathy, atrial fibrillation currently on Eliquis, history of CVA, memory loss,  tremors, who for the emergency department for chief concerns of elevated white cell count and worsening sacral wounds from [facility].  Vitals showed temperature of 98.3, respiration rate of 18, heart rate of 103, blood pressure 117/73, SpO2 99% on room air.  Serum sodium is 139, potassium 2.4, chloride 109, bicarb 23, BUN of 12, serum creatinine 0.72, nonfasting blood glucose 128, WBC 11.5, hemoglobin 10.3, platelets of 430.  eGFR greater than 60.  UA was negative for leukocytes and nitrates.  ED treatment: Fentanyl 50 mcg one-time dose, cefepime, vancomycin, sodium chloride 500 mL bolus.  ED provider ordered for PICC line placement as patient has no vascular access. ------------------------ At bedside, patient was able to tell me his name, the current calendar year and his current location.  He tells me that his age is 25 or 72.  Multiple attempts were given for him to correct the age however he was not able to.  He reports that he is in the hospital because he had "another episode ".  He was not able to clarify what episode that was.  He endorses bilateral lower leg pain that is chronic for him.  He denies nausea, vomiting, chest pain, shortness of breath.  Social history: Patient is from [facility].  He denies tobacco, EtOH, recreational drug  use.  ROS: Not able to complete as patient has moderate dementia/memory loss  ED Course: Discussed with emergency medicine provider, patient requiring hospitalization for chief concerns of worsening sacral wounds, failing clindamycin at [facility].   Assessment/Plan  Principal Problem:   Sepsis Active Problems:   Hypertension associated with diabetes   CORONARY ATHEROSCLEROSIS, ARTERY BYPASS GRAFT   Esophageal dysmotility   Chronic diastolic CHF (congestive heart failure)   Paranoid schizophrenia   Type 2 diabetes mellitus with morbid obesity   Obstructive sleep apnea on CPAP   Hyperlipidemia associated with type 2 diabetes mellitus   Persistent atrial fibrillation   CAD (coronary artery disease)   Hoarding disorder   Diabetic retinopathy   COPD, mild   History of CVA (cerebrovascular accident)   Memory changes   History of hypotension   Tremors of nervous system   Chronic pain   Sacral decubitus ulcer   Assessment and Plan:  * Sepsis Patient has increased heart rate, source of infection, with mild leukocytosis on admission however was reported at 12.7 from [facility].  Sepsis cannot be ruled out at this time Procalcitonin mildly elevated at 0.11 Blood cultures x 2 are in process Will continue with cefepime and vancomycin per pharmacy General surgery consulted for debridement via secure chat, Dr. Aleen Campi is aware and patient will be seen Admit to telemetry cardiac, inpatient  Sacral decubitus ulcer Appears deep, though difficult to stage given limited mobility and limited staff and my inability to roll him Osteomyelitis is a differential diagnosis at this time pending general surgery OR tomorrow Images in epic media General surgery consulted for  consideration of debridement Continue with cefepime, vancomycin, metronidazole on admission Home hydrocodone-acetaminophen 5-325 mg p.o. every 6 hours as needed for moderate pain resumed Morphine 4 mg IV every 4 hours  as needed for severe pain, 18 hours coverage ordered; AM team to reevaluate patient at bedside for continued IV pain medication requirements Post debridement, patient will need wound consultation NPO after midnight in anticipation of OR on 11/25/22 Admit to telemetry cardiac, inpatient  Chronic pain Resumed home hydrocodone-acetaminophen 5-325 mg p.o. every 6 hours as needed for moderate pain  Tremors of nervous system Home gabapentin 100 mg p.o. in the a.m. and 200 mg nightly resumed on admission  History of hypotension Patient was given stress dose steroids by ICU team during February/March 2024 hospitalization which was then discontinued and patient was started on midodrine taper down to 5 mg 3 times daily with meals Midodrine 3 times daily with meals resumed on admission, per last discharge summary this can be tapered further if patient's blood pressure continued to rise  Memory changes Home risperidone 1.5 mg nightly resumed  History of CVA (cerebrovascular accident) Simvastatin 20 mg nightly resumed Eliquis 5 mg p.o. twice daily not resumed on admission due to anticipating surgical debridement, a.m. team to resume when the benefits outweigh the risk  COPD, mild Patient is not in acute exacerbation at this time Resumed home long-acting/maintenance inhaler equivalent with Dulera 2 puff inhalation twice daily Albuterol 2 puff inhalation every 6 hours as needed for wheezing and shortness of breath resumed  Persistent atrial fibrillation Home Eliquis not resumed on admission in anticipation of possible debridement via general surgery AM team to resume when the benefits outweigh the risk  Hyperlipidemia associated with type 2 diabetes mellitus Home simvastatin 20 mg nightly resumed  Obstructive sleep apnea on CPAP CPAP nightly resumed  Type 2 diabetes mellitus with morbid obesity Last A1c 09/27/2022, was 6.0 Home Jardiance not resumed on admission Insulin SSI with at bedtime  coverage ordered Goal inpatient blood glucose levels 140-180  Esophageal dysmotility PPI resumed  Hypertension associated with diabetes Hydralazine 5 mg IV every 8 hours as needed for SBP greater 175, 4 days ordered If patient requires hydralazine 5 mg IV, a.m. team to consider tapering down midodrine 5 mg 3 times daily with meals  DVT prophylaxis-not initiated on admission due to possible anticipation of surgical debridement of sacral wounds.  AM team to initiate pharmacologic DVT prophylaxis when the benefits outweigh the risk  Chart reviewed.   DVT prophylaxis: None at this time, may need debridement Code Status: Full code Diet: Heart healthy; npo after midnight in anticipation of surgery/debridement am on 11/25/22 Family Communication: No Disposition Plan: Pending clinical course Consults called: General surgery Admission status: Inpatient, telemetry cardiac  Past Medical History:  Diagnosis Date   Anginal pain    Anxiety disorder    Asthma    Atresia of esophagus without fistula    CAD (coronary artery disease)    Cellulitis    CHF (congestive heart failure)    NYHA CLASS III,CHRONIC,DIASTOLIC   COPD (chronic obstructive pulmonary disease)    COVID-19    Diabetes mellitus without complication    Edema    RIGHT LOWER LEG   Gastroesophageal reflux    H/O: GI bleed    History of pneumonia    Remote   History of scarlet fever    Childhood   Hyperlipidemia    Hypertension    Myocardial infarction 2009   Obesity    Obstructive sleep apnea  Pain    CHRONIC BACK / ABDOMINAL   Panic disorder    Peripheral venous insufficiency    PTSD (post-traumatic stress disorder)    Retinopathy    DIABETIC   Stasis, venous    Stroke    Vertigo    Past Surgical History:  Procedure Laterality Date   CARDIAC CATHETERIZATION     CATARACT EXTRACTION Left    CATARACT EXTRACTION W/PHACO Right 05/03/2017   Procedure: CATARACT EXTRACTION PHACO AND INTRAOCULAR LENS PLACEMENT  (IOC);  Surgeon: Lockie Mola, MD;  Location: [facility] ORS;  Service: Ophthalmology;  Laterality: Right;  [place] 00:35.3 AP% 12.3 CDE 4.33 Fluid Pack lot # [id number]        COLONOSCOPY WITH PROPOFOL N/A 11/26/2020   Procedure: COLONOSCOPY WITH PROPOFOL;  Surgeon: Wyline Mood, MD;  Location: [facility] ENDOSCOPY;  Service: Gastroenterology;  Laterality: N/A;  ANNETTE TO PICK UP [phone]   CORONARY ANGIOPLASTY WITH STENT PLACEMENT  2002   CORONARY ANGIOPLASTY WITH STENT PLACEMENT  1999   CORONARY ARTERY BYPASS GRAFT     x7   ESOPHAGOGASTRODUODENOSCOPY N/A 09/19/2016   Procedure: ESOPHAGOGASTRODUODENOSCOPY (EGD);  Surgeon: Christena Deem, MD;  Location: [facility] ENDOSCOPY;  Service: Endoscopy;  Laterality: N/A;   ESOPHAGOGASTRODUODENOSCOPY (EGD) WITH PROPOFOL  11/26/2020   Procedure: ESOPHAGOGASTRODUODENOSCOPY (EGD) WITH PROPOFOL;  Surgeon: Wyline Mood, MD;  Location: [facility] ENDOSCOPY;  Service: Gastroenterology;;   Social History:  reports that he has never smoked. He has never used smokeless tobacco. He reports that he does not drink alcohol and does not use drugs.  Allergies  Allergen Reactions   Penicillin G Hives   Sulfa Antibiotics Hives   Tiotropium    Zoloft [Sertraline Hcl] Other (See Comments)   Family History  Problem Relation Age of Onset   Heart attack Mother    Hypertension Mother    Hyperlipidemia Mother    Heart attack Brother 81       MI   Coronary artery disease Other    Family history: Family history reviewed and not pertinent.  Prior to Admission medications   Medication Sig Start Date End Date Taking? Authorizing Provider  albuterol (VENTOLIN HFA) 108 (90 Base) MCG/ACT inhaler Inhale 2 puffs into the lungs every 6 (six) hours as needed for wheezing or shortness of breath. 12/16/21  Yes Clapacs, Jackquline Denmark, MD  apixaban (ELIQUIS) 5 MG TABS tablet Take 1 tablet (5 mg total) by mouth 2 (two) times daily. Patient taking differently: Take 5 mg by mouth every 12 (twelve)  hours. 10/06/22  Yes Darlin Priestly, MD  clindamycin (CLEOCIN) 300 MG capsule Take 300 mg by mouth every 6 (six) hours. Every 6 hours for 10 days 01/29/22  Yes [provider]  D-5000 125 MCG (5000 UT) TABS Take 1 tablet by mouth daily. 01/09/22  Yes [provider]  empagliflozin (JARDIANCE) 10 MG TABS tablet Take 1 tablet (10 mg total) by mouth daily. 12/16/21  Yes Clapacs, Jackquline Denmark, MD  fluticasone-salmeterol (WIXELA INHUB) 100-50 MCG/ACT AEPB Inhale 1 puff into the lungs 2 (two) times daily.   Yes [provider]  gabapentin (NEURONTIN) 100 MG capsule Take 100 mg by mouth daily. 11/22/22  Yes [provider]  Multiple Vitamin (MULTIVITAMIN WITH MINERALS) TABS tablet Take 1 tablet by mouth daily. 10/07/22  Yes Darlin Priestly, MD  pantoprazole (PROTONIX) 40 MG tablet Take 1 tablet (40 mg total) by mouth daily. 12/16/21  Yes Clapacs, Jackquline Denmark, MD  Probiotic Product (BACID) CAPS Take 1 tablet by mouth every 12 (twelve)  hours.   Yes [provider]  risperiDONE (RISPERDAL) 3 MG tablet Take 3 mg by mouth at bedtime. 11/22/22  Yes [provider]  Sennosides (SENNA) 8.6 MG CAPS Take 2 capsules by mouth daily. 01/30/22  Yes [provider]  vitamin B-12 (CYANOCOBALAMIN) 1000 MCG tablet Take 1 tablet (1,000 mcg total) by mouth daily. 12/16/21  Yes Clapacs, Jackquline Denmark, MD  aspirin EC 81 MG EC tablet Take 1 tablet (81 mg total) by mouth daily. Swallow whole. 12/17/21   Clapacs, Jackquline Denmark, MD  feeding supplement (ENSURE ENLIVE / ENSURE PLUS) LIQD Take 237 mLs by mouth 2 (two) times daily between meals. 10/07/22   Darlin Priestly, MD  magnesium hydroxide (MILK OF MAGNESIA) 400 MG/5ML suspension Take 5 mLs by mouth daily as needed for mild constipation.    [provider]  melatonin 5 MG TABS Take 1 tablet (5 mg total) by mouth at bedtime as needed. 10/06/22   Darlin Priestly, MD  midodrine (PROAMATINE) 5 MG tablet Take 1 tablet (5 mg total) by mouth 3 (three) times daily with meals.  10/06/22   Darlin Priestly, MD  nitroGLYCERIN (NITROSTAT) 0.4 MG SL tablet Place 1 tablet (0.4 mg total) under the tongue every 5 (five) minutes as needed for chest pain. 12/16/21   Clapacs, Jackquline Denmark, MD  nystatin cream (MYCOSTATIN) Apply topically 2 (two) times daily. 08/28/22   [provider]  risperiDONE (RISPERDAL) 0.5 MG tablet Take 1 tablet (0.5 mg total) by mouth 2 (two) times daily. Patient taking differently: Take 1 mg by mouth 2 (two) times daily. 12/16/21   Clapacs, Jackquline Denmark, MD  Saccharomyces boulardii (PROBIOTIC) 250 MG CAPS TAKE 1 CAPSULE BY MOUTH EVERY MORNING AND AT BEDTIME 11/22/21   [name], [name] T, NP  simvastatin (ZOCOR) 20 MG tablet Take 20 mg by mouth at bedtime. 05/18/22   [provider]  SYMBICORT 80-4.5 MCG/ACT inhaler INHALE 2 PUFFS BY MOUTH 2 TIMES DAILY. RINSE MOUTH AFTER USE. 11/17/21   [name], [name] T, NP  TYLENOL 500 MG tablet Take 500 mg by mouth 3 (three) times daily as needed. 07/10/22   [provider]  ZYRTEC ALLERGY 10 MG tablet Take 10 mg by mouth at bedtime. 05/15/22   [provider]   Physical Exam: Vitals:   11/24/22 1040 11/24/22 1159 11/24/22 1323  BP: 117/73 120/78   Pulse: (!) 103 (!) 109   Resp: 18 18   Temp: 98.3 F (36.8 C) 97.8 F (36.6 C)   TempSrc: Oral Oral   SpO2: 99% 94%   Weight:   84.3 kg   Constitutional: appears chronically ill, NAD, calm and then intermittently confused, short-term memory Eyes: PERRL, lids and conjunctivae normal ENMT: Mucous membranes are moist. Posterior pharynx clear of any exudate or lesions. Age-appropriate dentition. Hearing appropriate Neck: normal, supple, no masses, no thyromegaly Respiratory: clear to auscultation bilaterally, no wheezing, no crackles. Normal respiratory effort. No accessory muscle use.  Cardiovascular: Regular rate and rhythm, no murmurs / rubs / gallops. No extremity edema. 2+ pedal pulses. No carotid bruits.  Abdomen: Obese abdomen, no tenderness, no masses  palpated, no hepatosplenomegaly. Bowel sounds positive.  Musculoskeletal: no clubbing / cyanosis. No joint deformity upper and lower extremities. Good ROM, no contractures, no atrophy. Normal muscle tone.  Skin: sacral wound, appears deep with erythema and purulence. Difficulty to stage due to lack of full visibility  Neurologic: Sensation intact. Strength 5/5 in all 4.  Psychiatric: Lacks judgment and insight consistent with history of  memory loss. Alert and oriented x 3.  Flat affect.  EKG: independently reviewed, showing atrial fibrillation with rate of 101, QTc 471  Chest x-ray on Admission: I personally reviewed and I agree with radiologist reading as below.  DG Chest 2 View  Result Date: 11/24/2022 CLINICAL DATA:  Cough EXAM: CHEST - 2 VIEW COMPARISON:  Chest x-ray dated September 27, 2022 FINDINGS: Patient is rotated to the right which somewhat limits evaluation. Cardiac and mediastinal contours are unchanged post median sternotomy. Elevation of the right hemidiaphragm. Mild bibasilar opacities which are likely due to atelectasis. No evidence of pneumothorax. Trace bilateral pleural effusions. IMPRESSION: 1. Mild bibasilar opacities which are likely due to atelectasis. 2. Trace bilateral pleural effusions. Electronically Signed   By: Allegra Lai M.D.   On: 11/24/2022 11:41    Labs on Admission: I have personally reviewed following labs CBC: Recent Labs  Lab 11/24/22 1047  WBC 11.5*  HGB 10.3*  HCT 34.0*  MCV 87.4  PLT 430*   Basic Metabolic Panel: Recent Labs  Lab 11/24/22 1047  NA 139  K 3.4*  CL 109  CO2 23  GLUCOSE 128*  BUN 12  CREATININE 0.72  CALCIUM 8.2*   GFR: Estimated Creatinine Clearance: 88.3 mL/min (by C-G formula based on SCr of 0.72 mg/dL). Liver Function Tests: Recent Labs  Lab 11/24/22 1047  AST 27  ALT 15  ALKPHOS 153*  BILITOT 0.6  PROT 5.8*  ALBUMIN 2.2*   Recent Labs  Lab 11/24/22 1047  LIPASE 29   Urine analysis:    Component  Value Date/Time   COLORURINE YELLOW (A) 11/24/2022 1100   APPEARANCEUR CLEAR (A) 11/24/2022 1100   APPEARANCEUR Clear 09/20/2021 0856   LABSPEC 1.026 11/24/2022 1100   LABSPEC 1.013 11/29/2014 1120   PHURINE 5.0 11/24/2022 1100   GLUCOSEU >=500 (A) 11/24/2022 1100   GLUCOSEU Negative 11/29/2014 1120   HGBUR NEGATIVE 11/24/2022 1100   BILIRUBINUR NEGATIVE 11/24/2022 1100   BILIRUBINUR Negative 09/20/2021 0856   BILIRUBINUR Negative 11/29/2014 1120   KETONESUR NEGATIVE 11/24/2022 1100   PROTEINUR NEGATIVE 11/24/2022 1100   NITRITE NEGATIVE 11/24/2022 1100   LEUKOCYTESUR NEGATIVE 11/24/2022 1100   LEUKOCYTESUR Negative 11/29/2014 1120   This document was prepared using Dragon Voice Recognition software and may include unintentional dictation errors.  Dr. Sedalia Muta [facility]  If 7PM-7AM, please contact overnight-coverage provider If 7AM-7PM, please contact day attending provider [email or web address]  11/24/2022, 3:47 PM

## 2022-11-24 NOTE — ED Triage Notes (Signed)
Patient to ED via [facility] from [facility] for abnormal labs- WBC 12.7. C/o pain all over- chronic. No other complaints at this time. VS WNL with EMS.

## 2022-11-24 NOTE — Assessment & Plan Note (Addendum)
Home risperidone 1.5 mg nightly resumed

## 2022-11-24 NOTE — ED Notes (Signed)
Brief, pads, linen changed. Pt repositioned.

## 2022-11-24 NOTE — Hospital Course (Addendum)
[name] is a 67 year old male with history of paranoid schizophrenia, hyperlipidemia, CAD, non-insulin-dependent diabetes mellitus, hypertension, obesity, diabetic retinopathy, atrial fibrillation currently on Eliquis, history of CVA, memory loss,  tremors, who for the emergency department for chief concerns of elevated white cell count and worsening sacral wounds from [facility].  Vitals showed temperature of 98.3, respiration rate of 18, heart rate of 103, blood pressure 117/73, SpO2 99% on room air.  Serum sodium is 139, potassium 2.4, chloride 109, bicarb 23, BUN of 12, serum creatinine 0.72, nonfasting blood glucose 128, WBC 11.5, hemoglobin 10.3, platelets of 430.  eGFR greater than 60.  UA was negative for leukocytes and nitrates.  ED treatment: Fentanyl 50 mcg one-time dose, cefepime, vancomycin, sodium chloride 500 mL bolus.  ED provider ordered for PICC line placement as patient has no vascular access.  4/20: Vital stable.  General surgery was consulted and patient will be going to the OR for debridement of his infected unstageable sacral decubitus wound.  Labs with resolution of leukocytosis, potassium at 3, bicarb 19, INR 1.4, preliminary blood cultures negative,PCt 0.11 Magnesium at 1.7.  Both are being repleted.  Patient underwent successful debridement by general surgery, per surgery he has no wound deep to the bone.  Biopsies were taken and wound VAC was placed.  4/21: Remained hemodynamically stable.  Preliminary wound cultures growing gram-negative rods.  Wound vacuum will be changed tomorrow by general surgery. Patient will need wound VAC care with change on Monday, Wednesday and Friday per surgery on discharge.  4/22: Hemodynamically stable.  Although preliminary wound cultures were same few gram-negative rods blood cultures remain negative in 24-hour.  Wound VAC was changed by general surgery today.  They are recommending changing 3 times a week which will be  done at his facility.  Facility ordered the wound VAC which will be delivered tomorrow so we will keep him for another day.  Message sent to ID pharmacy to help choose antibiotics.  4/23: Vitals stable.  Antibiotics were switched to Keflex and Flagyl.  Slight worsening of leukocytosis might be reactive with wound VAC change.  Patient was restarted on his home Eliquis yesterday and noted to have bleeding from his wound today, he was bleeding from 2 vessels next to the bone which were ligated by general surgery.  Hemoglobin decreased to 8.3 on recheck. Eliquis was held again.  We might have to keep holding it for prolonged time until surgery clears him to restart. Hemoglobin with further decrease to 7.8 on recheck in the afternoon-1 unit of PRBC ordered.  Bone biopsy results came back as osteomyelitis but all cultures remain negative. Asking ID to help about antibiotics.

## 2022-11-24 NOTE — Consult Note (Addendum)
Consent for general anesthesia obtained via phone [phone]) from Ms. Lenn Sink, Director of the Department of [facility], who is the patients legal guardian.   Consented for risks of anesthesia including but not limited to:  - adverse reactions to medications - damage to eyes, teeth, lips or other oral mucosa - nerve damage due to positioning  - sore throat or hoarseness - Damage to heart, brain, nerves, lungs, other parts of body or loss of life  Dorcas Carrow, MD  Anesthesiology

## 2022-11-24 NOTE — Assessment & Plan Note (Signed)
Patient is not in acute exacerbation at this time Resumed home long-acting/maintenance inhaler equivalent with Dulera 2 puff inhalation twice daily Albuterol 2 puff inhalation every 6 hours as needed for wheezing and shortness of breath resumed

## 2022-11-24 NOTE — Assessment & Plan Note (Addendum)
Home gabapentin 100 mg p.o. in the a.m. and 200 mg nightly resumed on admission

## 2022-11-24 NOTE — Assessment & Plan Note (Signed)
Home simvastatin 20 mg nightly resumed

## 2022-11-24 NOTE — Consult Note (Signed)
Pharmacy Antibiotic Note  Evan [name] is a 67 y.o. male admitted on 11/24/2022 with sepsis.  Pharmacy has been consulted for vancomycin and cefepime dosing.  Vancomycin 1750 mg IV x 1, 4/19 @ 1322  Plan: Start Vancomycin 1000 mg IV every 12 hours Estimated AUC 483, Cmin 13.9 Wt 84.3 kg, Scr 0.8, Vd coefficient 0.72 Vancomycin level at steady state or as clinically indicated Start Cefepime 2 grams IV every 8 hours Follow renal function for adjustments  Weight: 84.3 kg (185 lb 13.6 oz)  Temp (24hrs), Avg:98.1 F (36.7 C), Min:97.8 F (36.6 C), Max:98.3 F (36.8 C)  Recent Labs  Lab 11/24/22 1047 11/24/22 1239  WBC 11.5*  --   CREATININE 0.72  --   LATICACIDVEN  --  1.7    Estimated Creatinine Clearance: 88.3 mL/min (by C-G formula based on SCr of 0.72 mg/dL).    Allergies  Allergen Reactions   Penicillin G Hives   Sulfa Antibiotics Hives   Tiotropium    Zoloft [Sertraline Hcl] Other (See Comments)    Antimicrobials this admission: Vancomycin 4/19 >>  cefepime 4/19 >>  Flagyl 4/19 >>  Dose adjustments this admission: N/A  Microbiology results: 4/19 BCx: pending   Thank you for allowing pharmacy to be a part of this patient's care.  Barrie Folk, PharmD 11/24/2022 2:15 PM

## 2022-11-24 NOTE — Consult Note (Signed)
CODE SEPSIS - PHARMACY COMMUNICATION  **Broad Spectrum Antibiotics should be administered within 1 hour of Sepsis diagnosis**  Time Code Sepsis Called/Page Received: 1226  Antibiotics Ordered: cefepime and vancomycin  Time of 1st antibiotic administration: 1248  Additional action taken by pharmacy: N/A  Barrie Folk ,PharmD Clinical Pharmacist  11/24/2022  12:32 PM

## 2022-11-24 NOTE — Assessment & Plan Note (Signed)
Home Eliquis not resumed on admission in anticipation of possible debridement via general surgery AM team to resume when the benefits outweigh the risk

## 2022-11-24 NOTE — Assessment & Plan Note (Signed)
Patient was given stress dose steroids by ICU team during February/March 2024 hospitalization which was then discontinued and patient was started on midodrine taper down to 5 mg 3 times daily with meals Midodrine 3 times daily with meals resumed on admission, per last discharge summary this can be tapered further if patient's blood pressure continued to rise

## 2022-11-24 NOTE — Consult Note (Signed)
[facility] SURGICAL CONSULTATION NOTE (initial) - cpt: [id number]   HISTORY OF PRESENT ILLNESS (HPI):  67 y.o. male presented to [facility] ED today for evaluation of an abnormal lab. Patient with a history of paranoid schizophrenia, CAD, diabetes, hypertension, obesity who resides at [facility] was sent to the ED for evaluation after being found to have elevated WBC to 12.7K. However, it seems in further discussion with the patient's SNF, he has been receiving PO antibiotics for a suspected infection and was due to get IV Abx but no one came to place PICC. As such, he was referred to the ED. Patient denied any fever, chills, cough, CP, SOB, nausea, emesis. He does have a large sacral wound which he believes has been around "for 30 days." He does endorse "butt pain." Work up in the ED here revealed a mild leukocytosis to 11.5K, Hgb to 10.3 which appears stable, renal function normal with sCr - 0.72, mild hypokalemia to 3.4, normal venous lactate at 1.7. He was ultimately admitted to the medicine service for possible sepsis and evaluation of sacral decubitus ulcer. He was started on cefepime and vancomycin.   Surgery is consulted by hospitalist physician Dr. Londell Moh, DO in this context for evaluation and management of sacral decubitus ulcer.   PAST MEDICAL HISTORY (PMH):  Past Medical History:  Diagnosis Date   Anginal pain    Anxiety disorder    Asthma    Atresia of esophagus without fistula    CAD (coronary artery disease)    Cellulitis    CHF (congestive heart failure)    NYHA CLASS III,CHRONIC,DIASTOLIC   COPD (chronic obstructive pulmonary disease)    COVID-19    Diabetes mellitus without complication    Edema    RIGHT LOWER LEG   Gastroesophageal reflux    H/O: GI bleed    History of pneumonia    Remote   History of scarlet fever    Childhood   Hyperlipidemia    Hypertension    Myocardial infarction 2009   Obesity    Obstructive sleep apnea    Pain    CHRONIC  BACK / ABDOMINAL   Panic disorder    Peripheral venous insufficiency    PTSD (post-traumatic stress disorder)    Retinopathy    DIABETIC   Stasis, venous    Stroke    Vertigo      PAST SURGICAL HISTORY (PSH):  Past Surgical History:  Procedure Laterality Date   CARDIAC CATHETERIZATION     CATARACT EXTRACTION Left    CATARACT EXTRACTION W/PHACO Right 05/03/2017   Procedure: CATARACT EXTRACTION PHACO AND INTRAOCULAR LENS PLACEMENT (IOC);  Surgeon: Lockie Mola, MD;  Location: [facility] ORS;  Service: Ophthalmology;  Laterality: Right;  [place] 00:35.3 AP% 12.3 CDE 4.33 Fluid Pack lot # [id number]        COLONOSCOPY WITH PROPOFOL N/A 11/26/2020   Procedure: COLONOSCOPY WITH PROPOFOL;  Surgeon: Wyline Mood, MD;  Location: [facility] ENDOSCOPY;  Service: Gastroenterology;  Laterality: N/A;  ANNETTE TO PICK UP [phone]   CORONARY ANGIOPLASTY WITH STENT PLACEMENT  2002   CORONARY ANGIOPLASTY WITH STENT PLACEMENT  1999   CORONARY ARTERY BYPASS GRAFT     x7   ESOPHAGOGASTRODUODENOSCOPY N/A 09/19/2016   Procedure: ESOPHAGOGASTRODUODENOSCOPY (EGD);  Surgeon: Christena Deem, MD;  Location: [facility] ENDOSCOPY;  Service: Endoscopy;  Laterality: N/A;   ESOPHAGOGASTRODUODENOSCOPY (EGD) WITH PROPOFOL  11/26/2020   Procedure: ESOPHAGOGASTRODUODENOSCOPY (EGD) WITH PROPOFOL;  Surgeon: Wyline Mood, MD;  Location: [facility] ENDOSCOPY;  Service:  Gastroenterology;;     MEDICATIONS:  Prior to Admission medications   Medication Sig Start Date End Date Taking? Authorizing Provider  acetaminophen (TYLENOL) 500 MG tablet Take 500 mg by mouth every 8 (eight) hours as needed for mild pain.   Yes [provider]  albuterol (VENTOLIN HFA) 108 (90 Base) MCG/ACT inhaler Inhale 2 puffs into the lungs every 6 (six) hours as needed for wheezing or shortness of breath. 12/16/21  Yes Clapacs, Jackquline Denmark, MD  apixaban (ELIQUIS) 5 MG TABS tablet Take 1 tablet (5 mg total) by mouth 2 (two) times daily. Patient taking  differently: Take 5 mg by mouth every 12 (twelve) hours. 10/06/22  Yes Darlin Priestly, MD  cetirizine (ZYRTEC) 10 MG tablet Take 10 mg by mouth at bedtime.   Yes [provider]  Cholecalciferol (VITAMIN D-3) 125 MCG (5000 UT) TABS Take 5,000 Units by mouth daily.   Yes [provider]  clindamycin (CLEOCIN) 300 MG capsule Take 300 mg by mouth every 6 (six) hours. 11/21/22 12/01/22 Yes [provider]  empagliflozin (JARDIANCE) 10 MG TABS tablet Take 1 tablet (10 mg total) by mouth daily. 12/16/21  Yes Clapacs, Jackquline Denmark, MD  fluticasone-salmeterol (WIXELA INHUB) 100-50 MCG/ACT AEPB Inhale 1 puff into the lungs 2 (two) times daily.   Yes [provider]  gabapentin (NEURONTIN) 100 MG capsule Take 100-200 mg by mouth See admin instructions. Take 1 capsule (100mg ) by mouth every morning and take 2 capsules (200mg ) by mouth every night at bedtime   Yes [provider]  HYDROcodone-acetaminophen (NORCO/VICODIN) 5-325 MG tablet Take 1 tablet by mouth every 6 (six) hours as needed for moderate pain.   Yes [provider]  linezolid (ZYVOX) 600 MG tablet Take 600 mg by mouth 2 (two) times daily. 11/21/22 12/01/22 Yes [provider]  magnesium hydroxide (MILK OF MAGNESIA) 400 MG/5ML suspension Take 5 mLs by mouth daily as needed for mild constipation.   Yes [provider]  melatonin 5 MG TABS Take 1 tablet (5 mg total) by mouth at bedtime as needed. 10/06/22  Yes Darlin Priestly, MD  midodrine (PROAMATINE) 5 MG tablet Take 1 tablet (5 mg total) by mouth 3 (three) times daily with meals. 10/06/22  Yes Darlin Priestly, MD  mirtazapine (REMERON) 7.5 MG tablet Take 7.5 mg by mouth at bedtime.   Yes [provider]  Multiple Vitamin (MULTIVITAMIN WITH MINERALS) TABS tablet Take 1 tablet by mouth daily. 10/07/22  Yes Darlin Priestly, MD  Multiple Vitamins-Minerals (MULTIVITAMIN WITH MINERALS) tablet Take 1 tablet by mouth daily.   Yes [provider]   nitroGLYCERIN (NITROSTAT) 0.4 MG SL tablet Place 1 tablet (0.4 mg total) under the tongue every 5 (five) minutes as needed for chest pain. 12/16/21  Yes Clapacs, Jackquline Denmark, MD  pantoprazole (PROTONIX) 40 MG tablet Take 1 tablet (40 mg total) by mouth daily. 12/16/21  Yes Clapacs, Jackquline Denmark, MD  Probiotic Product (BACID) CAPS Take 1 tablet by mouth every 12 (twelve) hours.   Yes [provider]  risperiDONE (RISPERDAL) 3 MG tablet Take 1.5 mg by mouth at bedtime. 11/22/22  Yes [provider]  senna (SENOKOT) 8.6 MG TABS tablet Take 2 tablets by mouth in the morning.   Yes [provider]  vancomycin IVPB Inject 1,000 mg into the vein every 12 (twelve) hours. 11/23/22 12/03/22 Yes [provider]  vitamin B-12 (CYANOCOBALAMIN) 1000 MCG tablet Take 1 tablet (1,000 mcg total) by mouth daily. 12/16/21  Yes Clapacs,  Jackquline Denmark, MD  feeding supplement (ENSURE ENLIVE / ENSURE PLUS) LIQD Take 237 mLs by mouth 2 (two) times daily between meals. 10/07/22   Darlin Priestly, MD  nystatin cream (MYCOSTATIN) Apply topically 2 (two) times daily. 08/28/22   [provider]  simvastatin (ZOCOR) 20 MG tablet Take 20 mg by mouth at bedtime. Patient not taking: Reported on 11/24/2022 05/18/22   [provider]  SYMBICORT 80-4.5 MCG/ACT inhaler INHALE 2 PUFFS BY MOUTH 2 TIMES DAILY. RINSE MOUTH AFTER USE. Patient not taking: Reported on 11/24/2022 11/17/21   Marjie Skiff, NP     ALLERGIES:  Allergies  Allergen Reactions   Penicillin G Hives   Sulfa Antibiotics Hives   Tiotropium    Zoloft [Sertraline Hcl] Other (See Comments)     SOCIAL HISTORY:  Social History   Socioeconomic History   Marital status: Single    Spouse name: Not on file   Number of children: Not on file   Years of education: Not on file   Highest education level: Not on file  Occupational History    Comment: disabled  Tobacco Use   Smoking status: Never   Smokeless tobacco: Never  Vaping Use    Vaping Use: Never used  Substance and Sexual Activity   Alcohol use: No   Drug use: No   Sexual activity: Not Currently  Other Topics Concern   Not on file  Social History Narrative   Disabled   Single   Social Determinants of Health   Financial Resource Strain: Low Risk  (08/22/2021)   Overall Financial Resource Strain (CARDIA)    Difficulty of Paying Living Expenses: Not hard at all  Food Insecurity: No Food Insecurity (09/26/2022)   Hunger Vital Sign    Worried About Running Out of Food in the Last Year: Never true    Ran Out of Food in the Last Year: Never true  Transportation Needs: No Transportation Needs (09/26/2022)   PRAPARE - Administrator, Civil Service (Medical): No    Lack of Transportation (Non-Medical): No  Physical Activity: Sufficiently Active (02/25/2021)   Exercise Vital Sign    Days of Exercise per Week: 7 days    Minutes of Exercise per Session: 30 min  Stress: No Stress Concern Present (04/14/2021)   [employer] of Occupational Health - Occupational Stress Questionnaire    Feeling of Stress : Not at all  Social Connections: Moderately Integrated (05/03/2021)   Social Connection and Isolation Panel [NHANES]    Frequency of Communication with Friends and Family: More than three times a week    Frequency of Social Gatherings with Friends and Family: More than three times a week    Attends Religious Services: More than 4 times per year    Active Member of [employer] or Organizations: Yes    Attends Banker Meetings: More than 4 times per year    Marital Status: Never married  Intimate Partner Violence: Not At Risk (09/26/2022)   Humiliation, Afraid, Rape, and Kick questionnaire    Fear of Current or Ex-Partner: No    Emotionally Abused: No    Physically Abused: No    Sexually Abused: No     FAMILY HISTORY:  Family History  Problem Relation Age of Onset   Heart attack Mother    Hypertension Mother    Hyperlipidemia Mother     Heart attack Brother 46       MI   Coronary artery disease Other  REVIEW OF SYSTEMS:  Review of Systems  Constitutional:  Negative for chills and fever.  Respiratory:  Negative for cough and sputum production.   Cardiovascular:  Negative for chest pain and palpitations.  Gastrointestinal:  Negative for nausea.  Genitourinary:  Negative for dysuria and urgency.  Skin:        + Sacral Wound   All other systems reviewed and are negative.   VITAL SIGNS:  Temp:  [97.8 F (36.6 C)-98.3 F (36.8 C)] 97.8 F (36.6 C) (04/19 1159) Pulse Rate:  [103-109] 109 (04/19 1159) Resp:  [18] 18 (04/19 1159) BP: (117-120)/(73-78) 120/78 (04/19 1159) SpO2:  [94 %-99 %] 94 % (04/19 1159) Weight:  [84.3 kg] 84.3 kg (04/19 1323)       Weight: 84.3 kg     INTAKE/OUTPUT:  No intake/output data recorded.  PHYSICAL EXAM:  Physical Exam Vitals and nursing note reviewed. Exam conducted with a chaperone present.  Constitutional:      General: He is not in acute distress.    Appearance: Normal appearance. He is obese. He is not ill-appearing.  HENT:     Head: Normocephalic and atraumatic.  Eyes:     General: No scleral icterus.    Conjunctiva/sclera: Conjunctivae normal.  Cardiovascular:     Rate and Rhythm: Normal rate and regular rhythm.     Pulses: Normal pulses.  Pulmonary:     Effort: Pulmonary effort is normal. No respiratory distress.  Genitourinary:    Comments: Deferred Skin:    General: Skin is warm.     Findings: Wound present.     Comments: ~7 x 4 cm sacral wound with infected appearing wound bed; unstageable; tunnels towards posterior upper left thigh   Neurological:     General: No focal deficit present.     Mental Status: He is alert and oriented to person, place, and time.  Psychiatric:     Comments: Difficult to reliable assess       Labs:     Latest Ref Rng & Units 11/24/2022   10:47 AM 10/02/2022    5:49 AM 09/30/2022    4:45 AM  CBC  WBC 4.0 - 10.5 K/uL  11.5  11.4  11.8   Hemoglobin 13.0 - 17.0 g/dL 09.8  11.9  14.7   Hematocrit 39.0 - 52.0 % 34.0  33.9  36.0   Platelets 150 - 400 K/uL 430  380  391       Latest Ref Rng & Units 11/24/2022   10:47 AM 10/02/2022    5:49 AM 09/30/2022    4:45 AM  CMP  Glucose 70 - 99 mg/dL 829  562  130   BUN 8 - 23 mg/dL 12  17  22    Creatinine 0.61 - 1.24 mg/dL 8.65  7.84  6.96   Sodium 135 - 145 mmol/L 139  136  135   Potassium 3.5 - 5.1 mmol/L 3.4  4.0  3.4   Chloride 98 - 111 mmol/L 109  106  106   CO2 22 - 32 mmol/L 23  25  22    Calcium 8.9 - 10.3 mg/dL 8.2  8.3  8.5   Total Protein 6.5 - 8.1 g/dL 5.8     Total Bilirubin 0.3 - 1.2 mg/dL 0.6     Alkaline Phos 38 - 126 U/L 153     AST 15 - 41 U/L 27     ALT 0 - 44 U/L 15        Imaging studies:  No new pertinent imaging studies   Assessment/Plan: (ICD-10's: L89.150) 67 y.o. male with unstageable and infected sacral ulcer   - Will plan for debridement in the OR tomorrow with Dr Aleen Campi pending OR/Anesthesia availability - All risks, benefits, and alternatives to above procedure(s) were discussed with the patient, all of his questions were answered to his expressed satisfaction, patient expresses he/ wishes to proceed, and informed consent was obtained.    - Wound Care: Wet to dry dressings BID for now; will update orders post-operatively tomorrow    - Continue IV Abx (cefepime, vancomycin) - Continue local wound care; pressure offloading; frequent repositioning +/- low air loss mattress - Pain control prn - Monitor leukocytosis   - Further management per primary service; we will follow  All of the above findings and recommendations were discussed with the patient, and all of patient's questions were answered to his expressed satisfaction.  Thank you for the opportunity to participate in this patient's care.   -- Lynden Oxford, PA-C [facility] 11/24/2022, 2:54 PM M-F: 7am - 4pm

## 2022-11-24 NOTE — ED Provider Notes (Signed)
[facility] Provider Note    Event Date/Time   First MD Initiated Contact with Patient 11/24/22 1033     (approximate)   History   Abnormal Labs   HPI  Evan [name] is a 67 y.o. male past medical history significant for paranoid schizophrenia, CAD, diabetes, hypertension, obesity, who presents to the emergency department with concern for lab work.  Patient is at a nursing facility.  States that he had lab work done today and was supposed to have a follow-up visit today in the hospital.  Was found to have an elevated white count of 12, he was concerned that he might be septic and asked to come over to the emergency department according to EMS and nursing facility.  Patient states that he feels like he normally feels.  States that he has pain all over always.  Ongoing cough.  Denies any dysuria, urinary urgency or frequency.  Denies any abdominal pain that is new.  Denies nausea or vomiting.  Denies diarrhea but does state that he has been constipated.     Physical Exam   Triage Vital Signs: ED Triage Vitals  Enc Vitals Group     BP 11/24/22 1040 117/73     Pulse Rate 11/24/22 1040 (!) 103     Resp 11/24/22 1040 18     Temp 11/24/22 1040 98.3 F (36.8 C)     Temp Source 11/24/22 1040 Oral     SpO2 11/24/22 1040 99 %     Weight --      Height --      Head Circumference --      Peak Flow --      Pain Score 11/24/22 1038 7     Pain Loc --      Pain Edu? --      Excl. in GC? --     Most recent vital signs: Vitals:   11/24/22 1040 11/24/22 1159  BP: 117/73 120/78  Pulse: (!) 103 (!) 109  Resp: 18 18  Temp: 98.3 F (36.8 C) 97.8 F (36.6 C)  SpO2: 99% 94%    Physical Exam Constitutional:      Appearance: He is well-developed.  HENT:     Head: Atraumatic.  Eyes:     Conjunctiva/sclera: Conjunctivae normal.  Cardiovascular:     Rate and Rhythm: Regular rhythm.  Pulmonary:     Effort: No respiratory distress.  Abdominal:      Tenderness: There is no abdominal tenderness.  Musculoskeletal:        General: Normal range of motion.     Cervical back: Normal range of motion.     Right lower leg: No edema.     Left lower leg: No edema.  Skin:    General: Skin is warm.     Capillary Refill: Capillary refill takes less than 2 seconds.     Comments: Large decubitus sacral wound that with surrounding erythema and warmth.  No purulent drainage.  Erythematous rash in the groin area.  No fluctuance.  No crepitus or induration.  Neurological:     Mental Status: He is alert. Mental status is at baseline.     IMPRESSION / MDM / ASSESSMENT AND PLAN / ED COURSE  I reviewed the triage vital signs and the nursing notes.  Differential diagnosis including infectious process, pneumonia, urinary tract infection, dehydration  EKG  I, Corena Herter, the attending physician, personally viewed and interpreted this ECG.   Rate: 101  Rhythm:  Normal sinus  Axis: Normal  Intervals: Normal  ST&T Change: None Nonspecific ST changes.  No significant change when compared to prior EKG.  No tachycardic or bradycardic dysrhythmias while on cardiac telemetry.  RADIOLOGY I independently reviewed imaging, my interpretation of imaging: Chest x-ray without acute findings when compared to prior.  No focal findings consistent with pneumonia.  Read as mild bibasilar opacity which are likely due to atelectasis with trace bilateral pleural effusions. LABS (all labs ordered are listed, but only abnormal results are displayed) Labs interpreted as -    Labs Reviewed  CBC - Abnormal; Notable for the following components:      Result Value   WBC 11.5 (*)    RBC 3.89 (*)    Hemoglobin 10.3 (*)    HCT 34.0 (*)    RDW 16.6 (*)    Platelets 430 (*)    All other components within normal limits  COMPREHENSIVE METABOLIC PANEL - Abnormal; Notable for the following components:   Potassium 3.4 (*)    Glucose, Bld 128 (*)    Calcium 8.2 (*)     Total Protein 5.8 (*)    Albumin 2.2 (*)    Alkaline Phosphatase 153 (*)    All other components within normal limits  URINALYSIS, ROUTINE W REFLEX MICROSCOPIC - Abnormal; Notable for the following components:   Color, Urine YELLOW (*)    APPearance CLEAR (*)    Glucose, UA >=500 (*)    All other components within normal limits  CULTURE, BLOOD (ROUTINE X 2)  CULTURE, BLOOD (ROUTINE X 2)  LIPASE, BLOOD  LACTIC ACID, PLASMA  LACTIC ACID, PLASMA     MDM  Mild leukocytosis of 11.5 which is consistent with prior lab work.  Creatinine appears to be at his baseline.  No significant electrolyte abnormalities.  Anemia but hemoglobin is stable.  Chest x-ray read as questionable bibasilar opacities.  Patient does have a mild cough which she states is chronic.  Clinical picture is not consistent with a community-acquired pneumonia.    After discussion with nursing home facility stated that the patient has been on oral antibiotics and was sent over for IV antibiotics.  States that he has been on 1 antibiotic and then clindamycin.  Attempted to receive IV vancomycin and cefepime however no one came out to place a midline today.  States that they called multiple times and they are uncertain of what time someone is coming out to place a midline so it was recommended that he come over to the emergency department for possible sepsis.  He has been afebrile but has had decreased oral intake generalized fatigue and worsening altered mental status from his baseline.  Added on blood cultures and a lactic acid.  Will start the patient on vancomycin and cefepime given his large sacral wound with concern for possible infectious process.  Does have tachycardia.  Felt that 30 cc/kg of IV fluids may be detrimental given that he is normotensive, given a 500 bolus and will reevaluate.  Appears to have contact dermatitis from depends, possible overlying fungal infection given concerning satellite lesions.  Clinical picture  is not consistent with Fournier's.  Consulted hospitalist for admission.  PROCEDURES:  Critical Care performed: yes  .Critical Care  Performed by: Corena Herter, MD Authorized by: Corena Herter, MD   Critical care provider statement:    Critical care time (minutes):  30   Critical care time was exclusive of:  Separately billable procedures and treating other patients  Critical care was necessary to treat or prevent imminent or life-threatening deterioration of the following conditions:  Sepsis   Critical care was time spent personally by me on the following activities:  Development of treatment plan with patient or surrogate, discussions with consultants, evaluation of patient's response to treatment, examination of patient, ordering and review of laboratory studies, ordering and review of radiographic studies, ordering and performing treatments and interventions, pulse oximetry, re-evaluation of patient's condition and review of old charts   Patient's presentation is most consistent with acute presentation with potential threat to life or bodily function.   MEDICATIONS ORDERED IN ED: Medications  lactated ringers infusion (has no administration in time range)  ceFEPIme (MAXIPIME) 2 g in sodium chloride 0.9 % 100 mL IVPB (has no administration in time range)  vancomycin (VANCOREADY) IVPB 1750 mg/350 mL (has no administration in time range)  fentaNYL (SUBLIMAZE) injection 50 mcg (has no administration in time range)  sodium chloride 0.9 % bolus 500 mL (500 mLs Intravenous New Bag/Given 11/24/22 1216)    FINAL CLINICAL IMPRESSION(S) / ED DIAGNOSES   Final diagnoses:  Pressure injury of deep tissue of sacral region     Rx / DC Orders   ED Discharge Orders     None        Note:  This document was prepared using Dragon voice recognition software and may include unintentional dictation errors.   Corena Herter, MD 11/24/22 1234

## 2022-11-24 NOTE — ED Notes (Signed)
Patient eating lunch tray at this time °

## 2022-11-24 NOTE — ED Notes (Signed)
Patient denying any complaints at this time other than generalized chronic pain.

## 2022-11-24 NOTE — Assessment & Plan Note (Addendum)
Patient met sepsis criteria with increased heart rate, source of infection, with mild leukocytosis on admission .  Procalcitonin mildly elevated at 0.11 Blood cultures x 2 -remain negative so far Will continue with cefepime and vancomycin per pharmacy.

## 2022-11-24 NOTE — Consult Note (Signed)
PHARMACY -  BRIEF ANTIBIOTIC NOTE   Pharmacy has received consult(s) for vancomycin dosing from an ED provider.  The patient's profile has been reviewed for ht/wt/allergies/indication/available labs.    One time order(s) placed for Vancomycin 1750 mg IV x 1  Further antibiotics/pharmacy consults should be ordered by admitting physician if indicated.                       Thank you, Barrie Folk, PharmD 11/24/2022  12:30 PM

## 2022-11-24 NOTE — Assessment & Plan Note (Signed)
-   CPAP nightly resumed 

## 2022-11-24 NOTE — Assessment & Plan Note (Addendum)
S/p wound debridement in OR by general surgery.  Per general surgery wound was reaching up to bone, biopsies were taken and wound VAC was placed. Patient has stage IV sacral decubitus ulcer. -Continue with broad-spectrum antibiotics -Continue with wound care 

## 2022-11-24 NOTE — Assessment & Plan Note (Signed)
Patient is currently on midodrine and blood pressure remained within goal Hydralazine 5 mg IV every 8 hours as needed for SBP greater 175, 4 days ordered

## 2022-11-24 NOTE — Assessment & Plan Note (Signed)
Simvastatin 20 mg nightly resumed Eliquis 5 mg p.o. twice daily not resumed on admission due to anticipating surgical debridement, a.m. team to resume when the benefits outweigh the risk

## 2022-11-24 NOTE — Progress Notes (Signed)
Elink will follow per sepsis protocol  

## 2022-11-24 NOTE — ED Notes (Signed)
Patient brief noted to be wet. Patient placed in clean, dry brief. No needs expressed at this time.

## 2022-11-24 NOTE — Assessment & Plan Note (Signed)
Resumed home hydrocodone-acetaminophen 5-325 mg p.o. every 6 hours as needed for moderate pain

## 2022-11-25 ENCOUNTER — Encounter
Admission: EM | Disposition: A | Discharge status: Skilled Nursing Facility | Payer: 59 | Source: Home / Self Care | Attending: Internal Medicine

## 2022-11-25 ENCOUNTER — Other Ambulatory Visit: Payer: Self-pay

## 2022-11-25 ENCOUNTER — Inpatient Hospital Stay: Payer: 59 | Admitting: Anesthesiology

## 2022-11-25 DIAGNOSIS — L89156 Pressure-induced deep tissue damage of sacral region: Secondary | ICD-10-CM | POA: Diagnosis not present

## 2022-11-25 DIAGNOSIS — A419 Sepsis, unspecified organism: Secondary | ICD-10-CM | POA: Diagnosis not present

## 2022-11-25 DIAGNOSIS — L89154 Pressure ulcer of sacral region, stage 4: Secondary | ICD-10-CM | POA: Diagnosis not present

## 2022-11-25 HISTORY — PX: WOUND DEBRIDEMENT: SHX247

## 2022-11-25 LAB — GLUCOSE, CAPILLARY
Glucose-Capillary: 98 mg/dL (ref 70–99)
Glucose-Capillary: 98 mg/dL (ref 70–99)
Glucose-Capillary: 122 mg/dL — ABNORMAL HIGH (ref 70–99)

## 2022-11-25 LAB — BASIC METABOLIC PANEL
Anion gap: 11 (ref 5–15)
BUN: 9 mg/dL (ref 8–23)
CO2: 19 mmol/L — ABNORMAL LOW (ref 22–32)
Calcium: 8.1 mg/dL — ABNORMAL LOW (ref 8.9–10.3)
Chloride: 109 mmol/L (ref 98–111)
Creatinine, Ser: 0.58 mg/dL — ABNORMAL LOW (ref 0.61–1.24)
GFR, Estimated: >60 mL/min (ref >60)
Glucose, Bld: 91 mg/dL (ref 70–99)
Potassium: 3 mmol/L — ABNORMAL LOW (ref 3.5–5.1)
Sodium: 139 mmol/L (ref 135–145)

## 2022-11-25 LAB — CULTURE, BLOOD (ROUTINE X 2): Special Requests: ADEQUATE

## 2022-11-25 LAB — CBC
HCT: 34 % — ABNORMAL LOW (ref 39.0–52.0)
Hemoglobin: 10.3 g/dL — ABNORMAL LOW (ref 13.0–17.0)
MCH: 26.3 pg (ref 26.0–34.0)
MCHC: 30.3 g/dL (ref 30.0–36.0)
MCV: 86.7 fL (ref 80.0–100.0)
Platelets: 419 10*3/uL — ABNORMAL HIGH (ref 150–400)
RBC: 3.92 MIL/uL — ABNORMAL LOW (ref 4.22–5.81)
RDW: 16.7 % — ABNORMAL HIGH (ref 11.5–15.5)
WBC: 8.4 10*3/uL (ref 4.0–10.5)
nRBC: 0 % (ref 0.0–0.2)

## 2022-11-25 LAB — MAGNESIUM: Magnesium: 1.7 mg/dL (ref 1.7–2.4)

## 2022-11-25 LAB — CORTISOL-AM, BLOOD: Cortisol - AM: 10.2 ug/dL (ref 6.7–22.6)

## 2022-11-25 LAB — AEROBIC/ANAEROBIC CULTURE W GRAM STAIN (SURGICAL/DEEP WOUND)

## 2022-11-25 LAB — PROTIME-INR
INR: 1.4 — ABNORMAL HIGH (ref 0.8–1.2)
Prothrombin Time: 17.2 seconds — ABNORMAL HIGH (ref 11.4–15.2)

## 2022-11-25 SURGERY — DEBRIDEMENT WOUND
Anesthesia: General

## 2022-11-25 MED ORDER — ACETAMINOPHEN 500 MG PO TABS
1000.0000 mg | ORAL_TABLET | Freq: Four times a day (QID) | ORAL | Status: DC | PRN
  Administered 2022-11-29 – 2022-11-30 (×2): 1000 mg via ORAL
  Filled 2022-11-25 (×2): qty 2

## 2022-11-25 MED ORDER — BUPIVACAINE-EPINEPHRINE 0.5% -1:200000 IJ SOLN
INTRAMUSCULAR | Status: DC | PRN
  Administered 2022-11-25: 40 mL

## 2022-11-25 MED ORDER — LACTATED RINGERS IV SOLN: INTRAVENOUS | Status: DC

## 2022-11-25 MED ORDER — ONDANSETRON HCL 4 MG/2ML IJ SOLN
INTRAMUSCULAR | Status: DC | PRN
  Administered 2022-11-25: 4 mg via INTRAVENOUS

## 2022-11-25 MED ORDER — FENTANYL CITRATE (PF) 100 MCG/2ML IJ SOLN: 25.0000 ug | INTRAMUSCULAR | Status: DC | PRN

## 2022-11-25 MED ORDER — ROCURONIUM BROMIDE 10 MG/ML (PF) SYRINGE
PREFILLED_SYRINGE | INTRAVENOUS | Status: AC
  Filled 2022-11-25: qty 10

## 2022-11-25 MED ORDER — SODIUM CHLORIDE 0.9 % IV SOLN: INTRAVENOUS | Status: DC | PRN

## 2022-11-25 MED ORDER — OXYCODONE HCL 5 MG PO TABS: 5.0000 mg | ORAL_TABLET | Freq: Once | ORAL | Status: DC | PRN

## 2022-11-25 MED ORDER — ONDANSETRON HCL 4 MG/2ML IJ SOLN: 4.0000 mg | Freq: Once | INTRAMUSCULAR | Status: DC | PRN

## 2022-11-25 MED ORDER — PHENYLEPHRINE 80 MCG/ML (10ML) SYRINGE FOR IV PUSH (FOR BLOOD PRESSURE SUPPORT)
PREFILLED_SYRINGE | INTRAVENOUS | Status: AC
  Filled 2022-11-25: qty 10

## 2022-11-25 MED ORDER — SUGAMMADEX SODIUM 200 MG/2ML IV SOLN
INTRAVENOUS | Status: DC | PRN
  Administered 2022-11-25: 168.6 mg via INTRAVENOUS

## 2022-11-25 MED ORDER — PHENYLEPHRINE HCL-NACL 20-0.9 MG/250ML-% IV SOLN
INTRAVENOUS | Status: DC | PRN
  Administered 2022-11-25: 50 ug/min via INTRAVENOUS

## 2022-11-25 MED ORDER — ACETAMINOPHEN 10 MG/ML IV SOLN
INTRAVENOUS | Status: AC
  Filled 2022-11-25: qty 100

## 2022-11-25 MED ORDER — ROCURONIUM BROMIDE 100 MG/10ML IV SOLN
INTRAVENOUS | Status: DC | PRN
  Administered 2022-11-25: 40 mg via INTRAVENOUS

## 2022-11-25 MED ORDER — FENTANYL CITRATE (PF) 100 MCG/2ML IJ SOLN
INTRAMUSCULAR | Status: DC | PRN
  Administered 2022-11-25: 25 ug via INTRAVENOUS

## 2022-11-25 MED ORDER — PHENYLEPHRINE HCL-NACL 20-0.9 MG/250ML-% IV SOLN
INTRAVENOUS | Status: AC
  Filled 2022-11-25: qty 250

## 2022-11-25 MED ORDER — MIDAZOLAM HCL 2 MG/2ML IJ SOLN
INTRAMUSCULAR | Status: DC | PRN
  Administered 2022-11-25: 1 mg via INTRAVENOUS

## 2022-11-25 MED ORDER — LIDOCAINE HCL (CARDIAC) PF 100 MG/5ML IV SOSY
PREFILLED_SYRINGE | INTRAVENOUS | Status: DC | PRN
  Administered 2022-11-25: 80 mg via INTRAVENOUS

## 2022-11-25 MED ORDER — OXYCODONE HCL 5 MG/5ML PO SOLN: 5.0000 mg | Freq: Once | ORAL | Status: DC | PRN

## 2022-11-25 MED ORDER — 0.9 % SODIUM CHLORIDE (POUR BTL) OPTIME
TOPICAL | Status: DC | PRN
  Administered 2022-11-25: 1000 mL

## 2022-11-25 MED ORDER — LACTATED RINGERS IV SOLN: INTRAVENOUS | Status: DC | PRN

## 2022-11-25 MED ORDER — MIDAZOLAM HCL 2 MG/2ML IJ SOLN
INTRAMUSCULAR | Status: AC
  Filled 2022-11-25: qty 2

## 2022-11-25 MED ORDER — LIDOCAINE HCL (PF) 2 % IJ SOLN
INTRAMUSCULAR | Status: AC
  Filled 2022-11-25: qty 5

## 2022-11-25 MED ORDER — MORPHINE SULFATE (PF) 2 MG/ML IV SOLN
2.0000 mg | INTRAVENOUS | Status: DC | PRN
  Administered 2022-11-28 – 2022-11-29 (×2): 2 mg via INTRAVENOUS
  Filled 2022-11-25 (×2): qty 1

## 2022-11-25 MED ORDER — PHENYLEPHRINE HCL (PRESSORS) 10 MG/ML IV SOLN
INTRAVENOUS | Status: DC | PRN
  Administered 2022-11-25 (×3): 160 ug via INTRAVENOUS
  Administered 2022-11-25 (×2): 240 ug via INTRAVENOUS
  Administered 2022-11-25: 160 ug via INTRAVENOUS

## 2022-11-25 MED ORDER — PROPOFOL 10 MG/ML IV BOLUS
INTRAVENOUS | Status: DC | PRN
  Administered 2022-11-25: 120 mg via INTRAVENOUS

## 2022-11-25 MED ORDER — DEXAMETHASONE SODIUM PHOSPHATE 10 MG/ML IJ SOLN
INTRAMUSCULAR | Status: AC
  Filled 2022-11-25: qty 1

## 2022-11-25 MED ORDER — ACETAMINOPHEN 10 MG/ML IV SOLN
INTRAVENOUS | Status: DC | PRN
  Administered 2022-11-25: 1000 mg via INTRAVENOUS

## 2022-11-25 MED ORDER — POTASSIUM CHLORIDE 10 MEQ/100ML IV SOLN
10.0000 meq | INTRAVENOUS | Status: AC
  Administered 2022-11-25 (×4): 10 meq via INTRAVENOUS
  Filled 2022-11-25 (×4): qty 100

## 2022-11-25 MED ORDER — ACETAMINOPHEN 10 MG/ML IV SOLN: 1000.0000 mg | Freq: Once | INTRAVENOUS | Status: DC | PRN

## 2022-11-25 MED ORDER — ONDANSETRON HCL 4 MG/2ML IJ SOLN
INTRAMUSCULAR | Status: AC
  Filled 2022-11-25: qty 2

## 2022-11-25 MED ORDER — MAGNESIUM SULFATE 2 GM/50ML IV SOLN
2.0000 g | Freq: Once | INTRAVENOUS | Status: AC
  Administered 2022-11-25: 2 g via INTRAVENOUS
  Filled 2022-11-25 (×2): qty 50

## 2022-11-25 MED ORDER — FENTANYL CITRATE (PF) 100 MCG/2ML IJ SOLN
INTRAMUSCULAR | Status: AC
  Filled 2022-11-25: qty 2

## 2022-11-25 MED ORDER — OXYCODONE HCL 5 MG PO TABS
5.0000 mg | ORAL_TABLET | ORAL | Status: DC | PRN
  Administered 2022-11-25 – 2022-11-27 (×4): 10 mg via ORAL
  Filled 2022-11-25 (×4): qty 2

## 2022-11-25 SURGICAL SUPPLY — 37 items
BARRIER SKIN 2 RING SOFTFLEX (WOUND CARE) ×1
BLADE SURG 15 STRL LF DISP TIS (BLADE) ×1
BLADE SURG 15 STRL SS (BLADE) ×1
BRIEF MESH DISP 2XL (UNDERPADS AND DIAPERS) ×1
CANISTER WOUND CARE 500ML ATS (WOUND CARE) ×1
DRAPE FOR WOUND VAC UNIT (DRAPES) ×1
DRAPE LAPAROTOMY 100X77 ABD (DRAPES) ×1
DRSG VAC GRANUFOAM LG (GAUZE/BANDAGES/DRESSINGS) ×1
ELECT CAUTERY BLADE TIP 2.5 (TIP) ×1
ELECT REM PT RETURN 9FT ADLT (ELECTROSURGICAL) ×1
GAUZE 4X4 16PLY ~~LOC~~+RFID DBL (SPONGE) ×1
GAUZE SPONGE 4X4 12PLY STRL (GAUZE/BANDAGES/DRESSINGS) ×1
GLOVE SURG SYN 7.0 (GLOVE) ×1 IMPLANT
GLOVE SURG SYN 7.5  E (GLOVE) ×1
GLOVE SURG SYN 7.5 E (GLOVE) ×1 IMPLANT
GOWN STRL REUS W/ TWL LRG LVL3 (GOWN DISPOSABLE) ×2
GOWN STRL REUS W/TWL LRG LVL3 (GOWN DISPOSABLE) ×2
KIT TURNOVER KIT A (KITS) ×1
LABEL OR SOLS (LABEL) ×1
MANIFOLD NEPTUNE II (INSTRUMENTS) ×1
NDL HYPO 22X1.5 SAFETY MO (MISCELLANEOUS) ×1 IMPLANT
NEEDLE HYPO 22X1.5 SAFETY MO (MISCELLANEOUS) ×1
NS IRRIG 500ML POUR BTL (IV SOLUTION) ×1
PACK BASIN MINOR ARMC (MISCELLANEOUS) ×1
PUNCH BIOPSY 3 (MISCELLANEOUS)
PUNCH BIOPSY 4MM (MISCELLANEOUS)
PUNCH BIOPSY DERMAL 6MM STRL (MISCELLANEOUS)
SOL PREP PVP 2OZ (MISCELLANEOUS) ×1
SUT ETHILON 2 0 FS 18 (SUTURE)
SUT VIC AB 2-0 SH 27 (SUTURE) ×2
SUT VIC AB 2-0 SH 27XBRD (SUTURE) ×2
SUT VIC AB 3-0 SH 27 (SUTURE) ×2
SUT VIC AB 3-0 SH 27X BRD (SUTURE) ×2
SYR 20ML LL LF (SYRINGE) ×1
SYR BULB IRRIG 60ML STRL (SYRINGE) ×1
TRAP FLUID SMOKE EVACUATOR (MISCELLANEOUS) ×1
WATER STERILE IRR 500ML POUR (IV SOLUTION) ×1

## 2022-11-25 NOTE — Anesthesia Postprocedure Evaluation (Signed)
Anesthesia Post Note  Patient: Evan [name]  Procedure(s) Performed: DEBRIDEMENT WOUND  Patient location during evaluation: PACU Anesthesia Type: General Level of consciousness: awake and alert and patient cooperative (back to preoperative baseline) Pain management: pain level controlled Vital Signs Assessment: post-procedure vital signs reviewed and stable Respiratory status: spontaneous breathing, nonlabored ventilation and respiratory function stable Cardiovascular status: blood pressure returned to baseline and stable Postop Assessment: adequate PO intake Anesthetic complications: no   No notable events documented.   Last Vitals:  Vitals:   11/25/22 1313 11/25/22 1551  BP: 112/67 (!) 91/56  Pulse: (!) 103 95  Resp: 12 16  Temp:  36.8 C  SpO2: 98% 100%    Last Pain:  Vitals:   11/25/22 1243  TempSrc:   PainSc: Asleep                 Reed Breech

## 2022-11-25 NOTE — Progress Notes (Signed)
Progress Note   Patient: Evan [name]:[id number] DOB: [birth year] DOA: 11/24/2022     1 DOS: the patient was seen and examined on 11/25/2022   Brief hospital course: [name] is a 67 year old male with history of paranoid schizophrenia, hyperlipidemia, CAD, non-insulin-dependent diabetes mellitus, hypertension, obesity, diabetic retinopathy, atrial fibrillation currently on Eliquis, history of CVA, memory loss,  tremors, who for the emergency department for chief concerns of elevated white cell count and worsening sacral wounds from [facility].  Vitals showed temperature of 98.3, respiration rate of 18, heart rate of 103, blood pressure 117/73, SpO2 99% on room air.  Serum sodium is 139, potassium 2.4, chloride 109, bicarb 23, BUN of 12, serum creatinine 0.72, nonfasting blood glucose 128, WBC 11.5, hemoglobin 10.3, platelets of 430.  eGFR greater than 60.  UA was negative for leukocytes and nitrates.  ED treatment: Fentanyl 50 mcg one-time dose, cefepime, vancomycin, sodium chloride 500 mL bolus.  ED provider ordered for PICC line placement as patient has no vascular access.  4/20: Vital stable.  General surgery was consulted and patient will be going to the OR for debridement of his infected unstageable sacral decubitus wound.  Labs with resolution of leukocytosis, potassium at 3, bicarb 19, INR 1.4, preliminary blood cultures negative,PCt 0.11 Magnesium at 1.7.  Both are being repleted.  Patient underwent successful debridement by general surgery, per surgery he has no wound deep to the bone.  Biopsies were taken and wound VAC was placed.  Assessment and Plan: * Sepsis Patient met sepsis criteria with increased heart rate, source of infection, with mild leukocytosis on admission .  Procalcitonin mildly elevated at 0.11 Blood cultures x 2 -remain negative so far Will continue with cefepime and vancomycin per pharmacy.  Sacral decubitus ulcer S/p wound debridement in  OR by general surgery.  Per general surgery wound was reaching up to bone, biopsies were taken and wound VAC was placed. Patient has stage IV sacral decubitus ulcer. -Continue with broad-spectrum antibiotics -Continue with wound care  Chronic pain Resumed home hydrocodone-acetaminophen 5-325 mg p.o. every 6 hours as needed for moderate pain  Tremors of nervous system Home gabapentin 100 mg p.o. in the a.m. and 200 mg nightly resumed on admission  History of CVA (cerebrovascular accident) Simvastatin 20 mg nightly resumed Eliquis 5 mg p.o. twice daily not resumed on admission due to anticipating surgical debridement,  -Will restart once cleared from surgery  Memory changes Home risperidone 1.5 mg nightly resumed  History of hypotension Patient was given stress dose steroids by ICU team during February/March 2024 hospitalization which was then discontinued and patient was started on midodrine taper down to 5 mg 3 times daily with meals Midodrine 3 times daily with meals resumed on admission, per last discharge summary this can be tapered further if patient's blood pressure continued to rise  COPD, mild Patient is not in acute exacerbation at this time Resumed home long-acting/maintenance inhaler equivalent with Dulera 2 puff inhalation twice daily Albuterol 2 puff inhalation every 6 hours as needed for wheezing and shortness of breath resumed  Persistent atrial fibrillation Holding home Eliquis until cleared from surgery  Hyperlipidemia associated with type 2 diabetes mellitus Home simvastatin 20 mg nightly resumed  Obstructive sleep apnea on CPAP CPAP nightly resumed  Type 2 diabetes mellitus with morbid obesity Last A1c 09/27/2022, was 6.0 Home Jardiance not resumed on admission Insulin SSI with at bedtime coverage ordered Goal inpatient blood glucose levels 140-180  Esophageal dysmotility PPI resumed  Hypertension associated with diabetes Patient is currently on  midodrine and blood pressure remained within goal Hydralazine 5 mg IV every 8 hours as needed for SBP greater 175, 4 days ordered    Subjective: Patient was complaining of 7/10 lower back and buttock pain when seen today.  Awaiting surgery.  Physical Exam: Vitals:   11/25/22 1243 11/25/22 1245 11/25/22 1300 11/25/22 1313  BP: 105/78 121/67 118/71 112/67  Pulse: (!) 111 100 100 (!) 103  Resp: Temp: (!) 97 F (36.1 C)  (!) 97.1 F (36.2 C)   TempSrc:      SpO2: 100% 100% 99% 98%  Weight:       General.  Overweight gentleman, in no acute distress. Pulmonary.  Lungs clear bilaterally, normal respiratory effort. CV.  Irregular.  Positive murmur Abdomen.  Soft, nontender, nondistended, BS positive. CNS.  Alert and oriented .  No focal neurologic deficit. Extremities.  No edema, no cyanosis, pulses intact and symmetrical. Psychiatry.  Appears to have some cognitive impairment  Data Reviewed: Prior data reviewed  Family Communication: Unable to reach legal guardian on phone  Disposition: Status is: Inpatient Remains inpatient appropriate because: Severity of illness  Planned Discharge Destination: Skilled nursing facility  Time spent: 50 minutes  This record has been created using Conservation officer, historic buildings. Errors have been sought and corrected,but may not always be located. Such creation errors do not reflect on the standard of care.   Author: Arnetha Courser, MD 11/25/2022 2:10 PM  For on call review [email or web address].

## 2022-11-25 NOTE — Transfer of Care (Signed)
Immediate Anesthesia Transfer of Care Note  Patient: Evan [name]  Procedure(s) Performed: Procedure(s): DEBRIDEMENT WOUND (N/A)  Patient Location: PACU  Anesthesia Type:General  Level of Consciousness: sedated  Airway & Oxygen Therapy: Patient Spontanous Breathing and Patient connected to face mask oxygen  Post-op Assessment: Report given to RN and Post -op Vital signs reviewed and stable  Post vital signs: Reviewed and stable  Last Vitals:  Vitals:   11/25/22 0351 11/25/22 1243  BP: 96/61 105/78  Pulse: 87 (!) 111  Resp: 20 12  Temp: 36.6 C (!) 36.1 C  SpO2: 94% 100%    Complications: No apparent anesthesia complications

## 2022-11-25 NOTE — Progress Notes (Signed)
11/25/2022  Subjective: No acute events overnight.  Patient's white blood cell count is now normalized to 8.4.  He is currently on cefepime, vancomycin, and Flagyl.  Reports some improvement in his pain.  He is due for surgery this morning for debridement of his sacral decubitus wound.  Vital signs: Temp:  [97.8 F (36.6 C)-98.3 F (36.8 C)] 97.9 F (36.6 C) (04/20 0351) Pulse Rate:  [87-109] 87 (04/20 0351) Resp:  [14-22] 20 (04/20 0351) BP: (96-120)/(61-83) 96/61 (04/20 0351) SpO2:  [94 %-99 %] 94 % (04/20 0351) Weight:  [84.3 kg] 84.3 kg (04/19 1323)   Intake/Output: 04/19 0701 - 04/20 0700 In: 100 [IV Piggyback:100] Out: -  Last BM Date :  (PTA)  Physical Exam: Constitutional: No acute distress Skin: Unstageable sacral decubitus wound with fibrinous necrotic tissue and some purulence.  Labs:  Recent Labs    11/24/22 1047 11/25/22 0453  WBC 11.5* 8.4  HGB 10.3* 10.3*  HCT 34.0* 34.0*  PLT 430* 419*   Recent Labs    11/24/22 1047 11/25/22 0453  NA 139 139  K 3.4* 3.0*  CL 109 109  CO2 23 19*  GLUCOSE 128* 91  BUN 12 9  CREATININE 0.72 0.58*  CALCIUM 8.2* 8.1*   Recent Labs    11/25/22 0453  LABPROT 17.2*  INR 1.4*    Imaging: DG Chest 2 View  Result Date: 11/24/2022 CLINICAL DATA:  Cough EXAM: CHEST - 2 VIEW COMPARISON:  Chest x-ray dated September 27, 2022 FINDINGS: Patient is rotated to the right which somewhat limits evaluation. Cardiac and mediastinal contours are unchanged post median sternotomy. Elevation of the right hemidiaphragm. Mild bibasilar opacities which are likely due to atelectasis. No evidence of pneumothorax. Trace bilateral pleural effusions. IMPRESSION: 1. Mild bibasilar opacities which are likely due to atelectasis. 2. Trace bilateral pleural effusions. Electronically Signed   By: Allegra Lai M.D.   On: 11/24/2022 11:41    Assessment/Plan: This is a 67 y.o. male with an unstageable sacral decubitus wound.  - Patient will be  taken to the operating room this morning for debridement of his sacral decubitus wound, likely placement of a wound VAC.  Discussed with him again the procedure at length.  Discussed yesterday the procedure also with Ms. Lenn Sink who is the director of the department of social services that is in charge of his care.  Will send tissue culture, possible bone culture.   I spent 35 minutes dedicated to the care of this patient on the date of this encounter to include pre-visit review of records, face-to-face time with the patient discussing diagnosis and management, and any post-visit coordination of care.  Howie Ill, MD [facility]

## 2022-11-25 NOTE — Consult Note (Signed)
WOC Nurse Consult Note: Reason for Consult: NPWT dressing changes to debrided Sacral Unstageable pressure injury, now Stage 4 Wound type:Pressure Pressure Injury POA: Yes  WOC Nurse will contact Surgery (Dr. Aleen Campi) for assistance with first surgical dressing change on Monday, 11/27/22.  WOC nursing team will follow, and will remain available to this patient, the nursing and medical teams.    Thank you for inviting [place] to participate in this patient's Plan of Care.  Ladona Mow, MSN, RN, CNS, GNP, Leda Min, [employer], [employer] phone:  [phone]

## 2022-11-25 NOTE — Op Note (Signed)
  Procedure Date:  11/25/2022  Pre-operative Diagnosis:  Unstageable sacral decubitus wound  Post-operative Diagnosis: Stage IV sacral decubitus wound  Procedure:  Debridement of sacral decubitus wound including skin, subcutaneous tissue, and muscle/fascia; sacral bone biopsy; negative pressure dressing placement more than 50 cm.  Surgeon:  Howie Ill, MD  Anesthesia:  General endotracheal  Estimated Blood Loss:  15 ml  Specimens:   Sacral wound tissue culture Sacral bone tissue culture Sacral bone biopsy  Complications:  None  Indications for Procedure:  This is a 67 y.o. male with diagnosis of an unstageable sacral decubitus wound, requiring debridement procedure.  The risks of bleeding, abscess or infection, injury to surrounding structures, and need for further procedures were all discussed with the patient and was willing to proceed.  Description of Procedure: The patient was correctly identified in the preoperative area and brought into the operating room.  The patient was placed supine with VTE prophylaxis in place.  Appropriate time-outs were performed.  Anesthesia was induced and the patient was intubated.  Appropriate antibiotics were infused.  He was placed in prone position.  The patient's sacral area was prepped and draped in usual sterile fashion.  The patient's sacral wound was debrided using cautery to include skin, subcutaneous tissue, and muscle/fascia.  This was sent for tissue culture.  There was some undermining of the wound particularly superiorly, and the skin edges were trimmed to allow for a more flush wound.  The sacral bone was exposed as part of his wound, making this a stage IV decubitus wound.  Two samples of bone were obtained using a Rongeur, one for culture and one for pathology.  After debridement was completed, cautery was used for hemostasis.  The wound cavity measured approximately 9 x 9 cm in size.  The wound was thoroughly irrigated and then a  black sponge wound vac was applied with appropriate seal, with bridge towards his right hip.  The patient was then placed in supine position, emerged from anesthesia, extubated, and brought to the recovery room for further management.  The patient tolerated the procedure well and all counts were correct at the end of the case.   Howie Ill, MD

## 2022-11-25 NOTE — Anesthesia Procedure Notes (Signed)
Procedure Name: Intubation Date/Time: 11/25/2022 11:07 AM  Performed by: Stormy Fabian, CRNAPre-anesthesia Checklist: Patient identified, Patient being monitored, Timeout performed, Emergency Drugs available and Suction available Patient Re-evaluated:Patient Re-evaluated prior to induction Oxygen Delivery Method: Circle system utilized Preoxygenation: Pre-oxygenation with 100% oxygen Induction Type: IV induction Ventilation: Mask ventilation without difficulty Laryngoscope Size: Mac and 4 Grade View: Grade I Tube type: Oral Tube size: 7.0 mm Number of attempts: 1 Airway Equipment and Method: Stylet Placement Confirmation: ETT inserted through vocal cords under direct vision, positive ETCO2 and breath sounds checked- equal and bilateral Secured at: 23 cm Tube secured with: Tape Dental Injury: Teeth and Oropharynx as per pre-operative assessment

## 2022-11-25 NOTE — Anesthesia Preprocedure Evaluation (Addendum)
Anesthesia Evaluation  Patient identified by MRN, date of birth, ID band Patient awake    Reviewed: Allergy & Precautions, NPO status , Patient's Chart, lab work & pertinent test results  History of Anesthesia Complications Negative for: history of anesthetic complications  Airway Mallampati: IV       Dental  (+) Poor Dentition, Missing   Pulmonary asthma , sleep apnea and Continuous Positive Airway Pressure Ventilation , COPD   Pulmonary exam normal breath sounds clear to auscultation       Cardiovascular hypertension, + CAD (s/p MI and CABG) and +CHF  + dysrhythmias (a fib on Eliquis)  Rhythm:Irregular Rate:Normal  ECG 11/24/22:  Atrial fibrillation with rapid ventricular response with premature ventricular or aberrantly conducted complexes Low voltage QRS Cannot rule out Inferior infarct (cited on or before 27-Sep-2022) T wave abnormality, consider lateral ischemia  Note HR has been in 80s-90s overnight   Neuro/Psych  PSYCHIATRIC DISORDERS Anxiety   Schizophrenia  Memory loss; cognitive dysfunction; diabetic retinopathy; tremors; chronic pain CVA (on Xarelto)    GI/Hepatic ,GERD  ,,  Endo/Other  diabetes, Type 2  Obesity   Renal/GU negative Renal ROS     Musculoskeletal   Abdominal   Peds  Hematology negative hematology ROS (+)   Anesthesia Other Findings Cardiology note 09/13/22:  67 y.o. male with  1. Coronary artery disease involving native coronary artery of native heart without angina pectoris  2. Need for vaccination  3. CHF (congestive heart failure), NYHA class III, chronic, diastolic (CMS-HCC)  4. Primary hypertension  5. Venous insufficiency (chronic) (peripheral)   67 year old gentleman with prior PTCA, undocumented coronary stents, CABG x6, currently without chest pain. The patient has essential hypertension, blood pressure low today on current BP medications. The patient has exertional dyspnea due  to underlying sleep apnea on CPAP. The patient has chronic atrial fibrillation, on Xarelto for stroke prevention.   Plan   Continue current medications Continue Xarelto for stroke prevention Continue rate control strategy Recommend Mediterranean Diet Recommend physical activity as tolerated Labs per PCP  Return to clinic for follow up in 3 months.    Reproductive/Obstetrics                              Anesthesia Physical Anesthesia Plan  ASA: 4  Anesthesia Plan: General   Post-op Pain Management:    Induction: Intravenous  PONV Risk Score and Plan: 2 and Ondansetron, Dexamethasone and Treatment may vary due to age or medical condition  Airway Management Planned: Oral ETT  Additional Equipment:   Intra-op Plan:   Post-operative Plan: Extubation in OR  Informed Consent: I have reviewed the patients History and Physical, chart, labs and discussed the procedure including the risks, benefits and alternatives for the proposed anesthesia with the patient or authorized representative who has indicated his/her understanding and acceptance.     Dental advisory given and Consent reviewed with POA  Plan Discussed with: CRNA  Anesthesia Plan Comments: (Patient is a ward of the state.  Dr. Dorcas Carrow spoke with DSS Director Lenn Sink via phone on 11/24/22 as noted in Epic to obtain consent for this procedure.  Ms. Abran Cantor consented for risks of anesthesia including but not limited to:  - adverse reactions to medications - damage to eyes, teeth, lips or other oral mucosa - nerve damage due to positioning  - sore throat or hoarseness - damage to heart, brain, nerves, lungs, other parts of body or  loss of life)         Anesthesia Quick Evaluation

## 2022-11-26 ENCOUNTER — Encounter: Payer: Self-pay | Admitting: Surgery

## 2022-11-26 DIAGNOSIS — E872 Acidosis, unspecified: Secondary | ICD-10-CM | POA: Insufficient documentation

## 2022-11-26 DIAGNOSIS — L89156 Pressure-induced deep tissue damage of sacral region: Secondary | ICD-10-CM | POA: Diagnosis not present

## 2022-11-26 LAB — CBC
HCT: 33.6 % — ABNORMAL LOW (ref 39.0–52.0)
Hemoglobin: 10.4 g/dL — ABNORMAL LOW (ref 13.0–17.0)
MCH: 26.3 pg (ref 26.0–34.0)
MCHC: 31 g/dL (ref 30.0–36.0)
MCV: 85.1 fL (ref 80.0–100.0)
Platelets: 454 10*3/uL — ABNORMAL HIGH (ref 150–400)
RBC: 3.95 MIL/uL — ABNORMAL LOW (ref 4.22–5.81)
RDW: 16.6 % — ABNORMAL HIGH (ref 11.5–15.5)
WBC: 10.5 10*3/uL (ref 4.0–10.5)
nRBC: 0 % (ref 0.0–0.2)

## 2022-11-26 LAB — BASIC METABOLIC PANEL
Anion gap: 11 (ref 5–15)
BUN: 10 mg/dL (ref 8–23)
CO2: 15 mmol/L — ABNORMAL LOW (ref 22–32)
Calcium: 8 mg/dL — ABNORMAL LOW (ref 8.9–10.3)
Chloride: 109 mmol/L (ref 98–111)
Creatinine, Ser: 0.75 mg/dL (ref 0.61–1.24)
GFR, Estimated: >60 mL/min (ref >60)
Glucose, Bld: 97 mg/dL (ref 70–99)
Potassium: 4.9 mmol/L (ref 3.5–5.1)
Sodium: 135 mmol/L (ref 135–145)

## 2022-11-26 LAB — GLUCOSE, CAPILLARY
Glucose-Capillary: 88 mg/dL (ref 70–99)
Glucose-Capillary: 164 mg/dL — ABNORMAL HIGH (ref 70–99)
Glucose-Capillary: 124 mg/dL — ABNORMAL HIGH (ref 70–99)
Glucose-Capillary: 109 mg/dL — ABNORMAL HIGH (ref 70–99)

## 2022-11-26 LAB — AEROBIC/ANAEROBIC CULTURE W GRAM STAIN (SURGICAL/DEEP WOUND)

## 2022-11-26 LAB — MAGNESIUM: Magnesium: 2.2 mg/dL (ref 1.7–2.4)

## 2022-11-26 LAB — CULTURE, BLOOD (ROUTINE X 2)

## 2022-11-26 MED ORDER — LACTATED RINGERS IV SOLN: INTRAVENOUS | Status: DC

## 2022-11-26 MED ORDER — LACTATED RINGERS IV BOLUS
1000.0000 mL | Freq: Once | INTRAVENOUS | Status: AC
  Administered 2022-11-26: 1000 mL via INTRAVENOUS

## 2022-11-26 NOTE — Progress Notes (Signed)
Progress Note   Patient: Evan [name] ZOX:[id number] DOB: 1955-10-03 DOA: 11/24/2022     2 DOS: the patient was seen and examined on 11/26/2022   Brief hospital course: Mr. Equan Cogbill is a 67 year old male with history of paranoid schizophrenia, hyperlipidemia, CAD, non-insulin-dependent diabetes mellitus, hypertension, obesity, diabetic retinopathy, atrial fibrillation currently on Eliquis, history of CVA, memory loss,  tremors, who for the emergency department for chief concerns of elevated white cell count and worsening sacral wounds from [facility].  Vitals showed temperature of 98.3, respiration rate of 18, heart rate of 103, blood pressure 117/73, SpO2 99% on room air.  Serum sodium is 139, potassium 2.4, chloride 109, bicarb 23, BUN of 12, serum creatinine 0.72, nonfasting blood glucose 128, WBC 11.5, hemoglobin 10.3, platelets of 430.  eGFR greater than 60.  UA was negative for leukocytes and nitrates.  ED treatment: Fentanyl 50 mcg one-time dose, cefepime, vancomycin, sodium chloride 500 mL bolus.  ED provider ordered for PICC line placement as patient has no vascular access.  4/20: Vital stable.  General surgery was consulted and patient will be going to the OR for debridement of his infected unstageable sacral decubitus wound.  Labs with resolution of leukocytosis, potassium at 3, bicarb 19, INR 1.4, preliminary blood cultures negative,PCt 0.11 Magnesium at 1.7.  Both are being repleted.  Patient underwent successful debridement by general surgery, per surgery he has no wound deep to the bone.  Biopsies were taken and wound VAC was placed.  4/21: Remained hemodynamically stable.  Preliminary wound cultures growing gram-negative rods.  Wound vacuum will be changed tomorrow by general surgery. Patient will need wound VAC care with change on Monday, Wednesday and Friday per surgery on discharge.  Assessment and Plan: * Sepsis Patient met sepsis criteria with increased  heart rate, source of infection, with mild leukocytosis on admission .  Procalcitonin mildly elevated at 0.11 Blood cultures x 2 -remain negative so far Wound cultures growing gram-negative rods Will continue with cefepime and vancomycin per pharmacy.  Sacral decubitus ulcer S/p wound debridement in OR by general surgery.  Per general surgery wound was reaching up to bone, biopsies were taken and wound VAC was placed. Patient has stage IV sacral decubitus ulcer. -Continue with broad-spectrum antibiotics -Continue with wound care  Chronic pain Resumed home hydrocodone-acetaminophen 5-325 mg p.o. every 6 hours as needed for moderate pain  Tremors of nervous system Home gabapentin 100 mg p.o. in the a.m. and 200 mg nightly resumed on admission  History of CVA (cerebrovascular accident) Simvastatin 20 mg nightly resumed Eliquis 5 mg p.o. twice daily not resumed on admission due to anticipating surgical debridement,  -Will restart once cleared from surgery  Memory changes Home risperidone 1.5 mg nightly resumed  History of hypotension Patient was given stress dose steroids by ICU team during February/March 2024 hospitalization which was then discontinued and patient was started on midodrine taper down to 5 mg 3 times daily with meals Midodrine 3 times daily with meals resumed on admission, per last discharge summary this can be tapered further if patient's blood pressure continued to rise  COPD, mild Patient is not in acute exacerbation at this time Resumed home long-acting/maintenance inhaler equivalent with Dulera 2 puff inhalation twice daily Albuterol 2 puff inhalation every 6 hours as needed for wheezing and shortness of breath resumed  Persistent atrial fibrillation Holding home Eliquis until cleared from surgery  Hyperlipidemia associated with type 2 diabetes mellitus Home simvastatin 20 mg nightly resumed  Obstructive  sleep apnea on CPAP CPAP nightly resumed  Type 2  diabetes mellitus with morbid obesity Last A1c 09/27/2022, was 6.0 Home Jardiance not resumed on admission Insulin SSI with at bedtime coverage ordered Goal inpatient blood glucose levels 140-180  Esophageal dysmotility PPI resumed  Hypertension associated with diabetes Patient is currently on midodrine and blood pressure remained within goal Hydralazine 5 mg IV every 8 hours as needed for SBP greater 175, 4 days ordered   Metabolic acidosis Patient with worsening bicarb to 15.  Might be due to recent infection. -Giving some IV fluid -Continue to monitor   Subjective: Patient was sitting in bed when seen today.  Denies any pain.  He was appeared somnolent, stating why I am that drowsy.  He did receive pain medications recently.  Physical Exam: Vitals:   11/25/22 2321 11/26/22 0504 11/26/22 0804 11/26/22 1300  BP: 123/80 110/61 (!) 104/58 106/67  Pulse: (!) 110 (!) 110 (!) 102 (!) 105  Resp: Temp: 98.2 F (36.8 C) 97.9 F (36.6 C) 97.6 F (36.4 C) 98.2 F (36.8 C)  TempSrc:   Oral Oral  SpO2: 96% 97% 99% 98%  Weight:       General.  Well-developed gentleman, in no acute distress. Pulmonary.  Lungs clear bilaterally, normal respiratory effort. CV.  Regular rate and rhythm, no JVD, rub or murmur. Abdomen.  Soft, nontender, nondistended, BS positive. CNS.  Somnolent Extremities.  No edema, no cyanosis, pulses intact and symmetrical. Psychiatry.  Appears to have some cognitive impairment  Data Reviewed: Prior data reviewed  Family Communication: Unable to reach legal guardian on phone  Disposition: Status is: Inpatient Remains inpatient appropriate because: Severity of illness  Planned Discharge Destination: Skilled nursing facility  Time spent: 45 minutes  This record has been created using Conservation officer, historic buildings. Errors have been sought and corrected,but may not always be located. Such creation errors do not reflect on the standard of care.    Author: Arnetha Courser, MD 11/26/2022 3:29 PM  For on call review [email or web address].

## 2022-11-26 NOTE — Assessment & Plan Note (Addendum)
Resolved with IV fluid -Continue to monitor

## 2022-11-26 NOTE — Progress Notes (Signed)
11/26/2022  Subjective: Patient is 1 Day Post-Op status post debridement of a sacral decubitus wound.  No acute events overnight.  Wound cultures currently pending but showing gram-negative rods.  White blood cell count is normal at 10.5.  Vital signs: Temp:  [97 F (36.1 C)-98.2 F (36.8 C)] 97.6 F (36.4 C) (04/21 0804) Pulse Rate:  [95-111] 102 (04/21 0804) Resp:  [12-18] 18 (04/21 0804) BP: (91-123)/(56-80) 104/58 (04/21 0804) SpO2:  [96 %-100 %] 99 % (04/21 0804)   Intake/Output: 04/20 0701 - 04/21 0700 In: 953.6 [I.V.:700; IV Piggyback:253.6] Out: 1050 [Urine:1050] Last BM Date :  (PTA)  Physical Exam: Constitutional: No acute distress Skin: Sacral wound with wound VAC dressing in place with bridging towards the right hip.  Good seal with serosanguineous fluid in the canister.  Labs:  Recent Labs    11/25/22 0453 11/26/22 0452  WBC 8.4 10.5  HGB 10.3* 10.4*  HCT 34.0* 33.6*  PLT 419* 454*   Recent Labs    11/25/22 0453 11/26/22 0452  NA 139 135  K 3.0* 4.9  CL 109 109  CO2 19* 15*  GLUCOSE 91 97  BUN 9 10  CREATININE 0.58* 0.75  CALCIUM 8.1* 8.0*   Recent Labs    11/25/22 0453  LABPROT 17.2*  INR 1.4*    Imaging: No results found.  Assessment/Plan: This is a 67 y.o. male s/p debridement of sacral decubitus wound.  - Patient is doing well today and denies any worsening pain.  Diet has been advanced. - Cultures currently pending.  Continue broad-spectrum IV antibiotics for now. - Wound VAC change tomorrow.   Howie Ill, MD [facility]

## 2022-11-26 NOTE — Consult Note (Signed)
WOC Nurse Consult Note: Patient is now in a room and is provided with bilateral Prevalon boots and a mattress replacement with low air loss feature today.  I have provided guidance for Nursing regarding turning and repositioning to minimize time in the supine position.  First surgical dressing change for the sacral NPWT dressing is to be on Monday, 11/27/22.  WOC Nurse will contact Dr. Aleen Campi for assistance.  WOC nursing team will follow, and will remain available to this patient, the nursing and medical teams.    Ladona Mow, MSN, RN, CNS, GNP, Leda Min, [employer], [employer] phone:  [phone]

## 2022-11-26 NOTE — Assessment & Plan Note (Addendum)
Patient met sepsis criteria with increased heart rate, source of infection, with mild leukocytosis on admission .  Procalcitonin mildly elevated at 0.11 Blood cultures x 2 -remain negative so far Wound cultures growing few gram-negative rods but overall cultures remain negative.  Bone biopsy with osteomyelitis Patient was supposed to go back to his facility on Keflex but started bleeding requiring ligation of a vessel next to bone.  Hemoglobin with more than 2 point decrease. -Ordered 1 unit of PRBC -We will monitor for another day -Message sent to ID to help with antibiotics due to positive bone biopsy

## 2022-11-26 NOTE — Plan of Care (Signed)

## 2022-11-27 DIAGNOSIS — L89156 Pressure-induced deep tissue damage of sacral region: Secondary | ICD-10-CM | POA: Diagnosis not present

## 2022-11-27 LAB — BASIC METABOLIC PANEL
Anion gap: 6 (ref 5–15)
BUN: 12 mg/dL (ref 8–23)
CO2: 26 mmol/L (ref 22–32)
Calcium: 8.3 mg/dL — ABNORMAL LOW (ref 8.9–10.3)
Chloride: 109 mmol/L (ref 98–111)
Creatinine, Ser: 0.78 mg/dL (ref 0.61–1.24)
GFR, Estimated: >60 mL/min (ref >60)
Glucose, Bld: 113 mg/dL — ABNORMAL HIGH (ref 70–99)
Potassium: 3.7 mmol/L (ref 3.5–5.1)
Sodium: 141 mmol/L (ref 135–145)

## 2022-11-27 LAB — CULTURE, BLOOD (ROUTINE X 2)

## 2022-11-27 LAB — AEROBIC/ANAEROBIC CULTURE W GRAM STAIN (SURGICAL/DEEP WOUND): Gram Stain: NONE SEEN

## 2022-11-27 LAB — GLUCOSE, CAPILLARY
Glucose-Capillary: 81 mg/dL (ref 70–99)
Glucose-Capillary: 96 mg/dL (ref 70–99)
Glucose-Capillary: 129 mg/dL — ABNORMAL HIGH (ref 70–99)
Glucose-Capillary: 109 mg/dL — ABNORMAL HIGH (ref 70–99)
Glucose-Capillary: 143 mg/dL — ABNORMAL HIGH (ref 70–99)

## 2022-11-27 LAB — CREATININE, SERUM
Creatinine, Ser: 0.76 mg/dL (ref 0.61–1.24)
GFR, Estimated: >60 mL/min (ref >60)

## 2022-11-27 MED ORDER — APIXABAN 5 MG PO TABS
5.0000 mg | ORAL_TABLET | Freq: Two times a day (BID) | ORAL | Status: DC
  Administered 2022-11-27: 5 mg via ORAL
  Filled 2022-11-27 (×2): qty 1

## 2022-11-27 MED ORDER — CEPHALEXIN 500 MG PO CAPS
500.0000 mg | ORAL_CAPSULE | Freq: Three times a day (TID) | ORAL | Status: DC
  Administered 2022-11-28 – 2022-11-30 (×9): 500 mg via ORAL
  Filled 2022-11-27 (×9): qty 1

## 2022-11-27 MED ORDER — METRONIDAZOLE 500 MG PO TABS: 500.0000 mg | ORAL_TABLET | Freq: Two times a day (BID) | ORAL | Status: DC

## 2022-11-27 MED ORDER — METRONIDAZOLE 500 MG PO TABS
500.0000 mg | ORAL_TABLET | Freq: Two times a day (BID) | ORAL | Status: DC
  Administered 2022-11-28 – 2022-11-30 (×5): 500 mg via ORAL
  Filled 2022-11-27 (×5): qty 1

## 2022-11-27 NOTE — Progress Notes (Signed)
   11/27/22 2300  Provider Notification  Provider Name/Title Edgardo Cintron-Diaz,MD  Date Provider Notified 11/27/22  Time Provider Notified 2230  Method of Notification  (secure chat)  Notification Reason Other (Comment) (I went to change and turn patient and I noted a moderate amount of blood with clots to his sacral wound. The wound vac had stopped working due to blockage  from clots of blood)  Provider response Other (Comment) (orders to place gauze dressing)  Date of Provider Response 11/27/22  Time of Provider Response [id number]

## 2022-11-27 NOTE — NC FL2 (Signed)
[place] MEDICAID FL2 LEVEL OF CARE FORM     IDENTIFICATION  Patient Name: Evan [name] Birthdate: 1955-10-05 Sex: male Admission Date (Current Location): 11/24/2022  [facility] and [place] Number:  Chiropodist and Address:  [facility], [address], [place], [place] [address]      Provider Number: [id number]  Attending Physician Name and Address:  Arnetha Courser, MD  Relative Name and Phone Number:       Current Level of Care: Hospital Recommended Level of Care: Skilled Nursing Facility Prior Approval Number:    Date Approved/Denied:   PASRR Number: [id number]  Discharge Plan: SNF    Current Diagnoses: Patient Active Problem List   Diagnosis Date Noted   Metabolic acidosis 11/26/2022   Sacral decubitus ulcer, stage IV 11/25/2022   Sepsis 11/24/2022   History of hypotension 11/24/2022   Tremors of nervous system 11/24/2022   Chronic pain 11/24/2022   Sacral decubitus ulcer 11/24/2022   Left ventricular hypokinesis inferolateral wall 09/28/2022   Moderate aortic stenosis 09/28/2022   Shock circulatory 09/27/2022   Elevated lactic acid level 09/27/2022   SIRS (systemic inflammatory response syndrome) 09/26/2022   Leukocytosis 09/26/2022   AKI (acute kidney injury) 09/26/2022   Undifferentiated schizophrenia 12/13/2021   Elevated ferritin level 11/11/2021   Memory changes 08/12/2021   Other thrombophilia 12/25/2020   Vitamin D deficiency 12/04/2020   Vitamin B12 deficiency 12/04/2020   Stenosis of right carotid artery 12/03/2020   History of CVA (cerebrovascular accident) 11/06/2020   History of 2019 novel coronavirus disease (COVID-19) 09/27/2020   Atherosclerosis of aorta 12/04/2019   Persistent atrial fibrillation 09/12/2019   CAD (coronary artery disease) 09/12/2019   Chronic venous stasis 09/12/2019   Hoarding disorder 09/12/2019   Cervical spinal stenosis 09/12/2019   Diabetic retinopathy 09/12/2019   COPD, mild  09/12/2019   Osteoporosis 09/09/2019   History of prostate cancer 09/09/2019   Paranoid schizophrenia 08/10/2019   Obstructive sleep apnea on CPAP 08/10/2019   Hyperlipidemia associated with type 2 diabetes mellitus 08/10/2019   Chronic diastolic CHF (congestive heart failure) 01/09/2014   Esophageal dysmotility 09/12/2013   Type 2 diabetes mellitus with morbid obesity 02/14/2012   Obesity, morbid (more than 100 lbs over ideal weight or BMI > 40) (HCC) 07/26/2011   Old myocardial infarction 03/02/2010   Hypertension associated with diabetes 03/20/2009   CORONARY ATHEROSCLEROSIS, ARTERY BYPASS GRAFT 03/20/2009    Orientation RESPIRATION BLADDER Height & Weight     Self, Place  Normal Incontinent Weight: 185 lb 13.6 oz (84.3 kg) Height:     BEHAVIORAL SYMPTOMS/MOOD NEUROLOGICAL BOWEL NUTRITION STATUS   (None)  (None) Continent Diet (Heart healthy/carb modified)  AMBULATORY STATUS COMMUNICATION OF NEEDS Skin     Verbally Wound Vac, Bruising, Other (Comment), Surgical wounds (Erythema/redness. Wound vac on sacrum.)                       Personal Care Assistance Level of Assistance              Functional Limitations Info  Sight, Hearing, Speech Sight Info: Adequate Hearing Info: Adequate Speech Info: Adequate    SPECIAL CARE FACTORS FREQUENCY                       Contractures Contractures Info: Not present    Additional Factors Info  Code Status, Allergies Code Status Info: Full code Allergies Info: Penicillin G, Sulfa Antibiotics, Tiotropium, Zoloft (Sertraline Hcl)  Current Medications (11/27/2022):  This is the current hospital active medication list Current Facility-Administered Medications  Medication Dose Route Frequency Provider Last Rate Last Admin   acetaminophen (TYLENOL) tablet 1,000 mg  1,000 mg Oral Q6H PRN Piscoya, Jose, MD       acidophilus (RISAQUAD) capsule 1 capsule  1 capsule Oral Q12H Piscoya, Jose, MD   1 capsule at  11/27/22 0815   albuterol (PROVENTIL) (2.5 MG/3ML) 0.083% nebulizer solution 3 mL  3 mL Inhalation Q6H PRN Piscoya, Jose, MD       ceFEPIme (MAXIPIME) 2 g in sodium chloride 0.9 % 100 mL IVPB  2 g Intravenous Q8H Piscoya, Jose, MD 200 mL/hr at 11/27/22 0443 2 g at 11/27/22 0443   cholecalciferol (VITAMIN D3) 25 MCG (1000 UNIT) tablet 5,000 Units  5,000 Units Oral Daily Henrene Dodge, MD   5,000 Units at 11/27/22 7829   cyanocobalamin (VITAMIN B12) tablet 1,000 mcg  1,000 mcg Oral Daily Piscoya, Jose, MD   1,000 mcg at 11/27/22 0815   feeding supplement (ENSURE ENLIVE / ENSURE PLUS) liquid 237 mL  237 mL Oral BID BM Piscoya, Jose, MD   237 mL at 11/27/22 0817   gabapentin (NEURONTIN) capsule 100 mg  100 mg Oral q morning Piscoya, Jose, MD   100 mg at 11/27/22 5621   And   gabapentin (NEURONTIN) capsule 200 mg  200 mg Oral QHS Piscoya, Jose, MD   200 mg at 11/26/22 2110   hydrALAZINE (APRESOLINE) injection 5 mg  5 mg Intravenous Q8H PRN Piscoya, Jose, MD       insulin aspart (novoLOG) injection 0-15 Units  0-15 Units Subcutaneous TID WC Piscoya, Jose, MD   1 Units at 11/26/22 1719   insulin aspart (novoLOG) injection 0-5 Units  0-5 Units Subcutaneous QHS Piscoya, Jose, MD       lactated ringers infusion   Intravenous Continuous Arnetha Courser, MD 100 mL/hr at 11/27/22 0442 New Bag at 11/27/22 0442   magnesium hydroxide (MILK OF MAGNESIA) suspension 5 mL  5 mL Oral Daily PRN Piscoya, Jose, MD       melatonin tablet 5 mg  5 mg Oral QHS PRN Piscoya, Jose, MD       metroNIDAZOLE (FLAGYL) IVPB 500 mg  500 mg Intravenous Q12H Piscoya, Jose, MD 100 mL/hr at 11/27/22 0136 500 mg at 11/27/22 0136   midodrine (PROAMATINE) tablet 5 mg  5 mg Oral TID WC Piscoya, Jose, MD   5 mg at 11/27/22 3086   mirtazapine (REMERON) tablet 7.5 mg  7.5 mg Oral QHS Piscoya, Jose, MD   7.5 mg at 11/26/22 2110   mometasone-formoterol (DULERA) 100-5 MCG/ACT inhaler 2 puff  2 puff Inhalation BID Piscoya, Elita Quick, MD   2 puff at 11/27/22  0856   morphine (PF) 2 MG/ML injection 2 mg  2 mg Intravenous Q4H PRN Piscoya, Jose, MD       multivitamin with minerals tablet 1 tablet  1 tablet Oral Daily Piscoya, Jose, MD   1 tablet at 11/27/22 0815   nitroGLYCERIN (NITROSTAT) SL tablet 0.4 mg  0.4 mg Sublingual Q5 min PRN Piscoya, Jose, MD       nystatin cream (MYCOSTATIN)   Topical BID PRN Piscoya, Jose, MD       ondansetron (ZOFRAN) tablet 4 mg  4 mg Oral Q6H PRN Piscoya, Jose, MD       Or   ondansetron (ZOFRAN) injection 4 mg  4 mg Intravenous Q6H PRN Henrene Dodge, MD  oxyCODONE (Oxy IR/ROXICODONE) immediate release tablet 5-10 mg  5-10 mg Oral Q4H PRN Piscoya, Jose, MD   10 mg at 11/27/22 0815   pantoprazole (PROTONIX) EC tablet 40 mg  40 mg Oral QAC breakfast Piscoya, Jose, MD   40 mg at 11/27/22 0815   risperiDONE (RISPERDAL) tablet 1.5 mg  1.5 mg Oral QHS Piscoya, Jose, MD   1.5 mg at 11/26/22 2146   senna (SENOKOT) tablet 17.2 mg  2 tablet Oral Daily Piscoya, Jose, MD   17.2 mg at 11/27/22 0815   senna-docusate (Senokot-S) tablet 1 tablet  1 tablet Oral QHS PRN Piscoya, Jose, MD       simvastatin (ZOCOR) tablet 20 mg  20 mg Oral QHS Piscoya, Jose, MD   20 mg at 11/26/22 2110   vancomycin (VANCOCIN) IVPB 1000 mg/200 mL premix  1,000 mg Intravenous Q12H Piscoya, Elita Quick, MD 200 mL/hr at 11/27/22 0609 1,000 mg at 11/27/22 0609     Discharge Medications: Please see discharge summary for a list of discharge medications.  Relevant Imaging Results:  Relevant Lab Results:   Additional Information SS#: [id number]  Margarito Liner, LCSW

## 2022-11-27 NOTE — Progress Notes (Signed)
PHARMACIST - PHYSICIAN COMMUNICATION  CONCERNING: Antibiotic IV to Oral Route Change Policy  RECOMMENDATION: This patient is receiving metronidazole by the intravenous route.  Based on criteria approved by the Pharmacy and Therapeutics Committee, the antibiotic(s) is/are being converted to the equivalent oral dose form(s).  DESCRIPTION: These criteria include: Patient being treated for a respiratory tract infection, urinary tract infection, cellulitis or clostridium difficile associated diarrhea if on metronidazole The patient is not neutropenic and does not exhibit a GI malabsorption state The patient is eating (either orally or via tube) and/or has been taking other orally administered medications for a least 24 hours The patient is improving clinically and has a Tmax < 100.5  If you have questions about this conversion, please contact the Pharmacy Department   Tressie Ellis 11/27/22

## 2022-11-27 NOTE — Assessment & Plan Note (Addendum)
Patient was given stress dose steroids by ICU team during February/March 2024 hospitalization which was then discontinued and patient was started on midodrine taper down to 5 mg 3 times daily with meals Midodrine 3 times daily with meals resumed on admission, per last discharge summary this can be tapered further if patient's blood pressure continued to rise -Currently blood pressure within goal -Continue to monitor

## 2022-11-27 NOTE — Progress Notes (Addendum)
Progress Note   Patient: Evan [name]:[id number] DOB: 09/04/1955 DOA: 11/24/2022     3 DOS: the patient was seen and examined on 11/27/2022   Brief hospital course: Evan [name] is a 67 year old male with history of paranoid schizophrenia, hyperlipidemia, CAD, non-insulin-dependent diabetes mellitus, hypertension, obesity, diabetic retinopathy, atrial fibrillation currently on Eliquis, history of CVA, memory loss,  tremors, who for the emergency department for chief concerns of elevated white cell count and worsening sacral wounds from [facility].  Vitals showed temperature of 98.3, respiration rate of 18, heart rate of 103, blood pressure 117/73, SpO2 99% on room air.  Serum sodium is 139, potassium 2.4, chloride 109, bicarb 23, BUN of 12, serum creatinine 0.72, nonfasting blood glucose 128, WBC 11.5, hemoglobin 10.3, platelets of 430.  eGFR greater than 60.  UA was negative for leukocytes and nitrates.  ED treatment: Fentanyl 50 mcg one-time dose, cefepime, vancomycin, sodium chloride 500 mL bolus.  ED provider ordered for PICC line placement as patient has no vascular access.  4/20: Vital stable.  General surgery was consulted and patient will be going to the OR for debridement of his infected unstageable sacral decubitus wound.  Labs with resolution of leukocytosis, potassium at 3, bicarb 19, INR 1.4, preliminary blood cultures negative,PCt 0.11 Magnesium at 1.7.  Both are being repleted.  Patient underwent successful debridement by general surgery, per surgery he has no wound deep to the bone.  Biopsies were taken and wound VAC was placed.  4/21: Remained hemodynamically stable.  Preliminary wound cultures growing gram-negative rods.  Wound vacuum will be changed tomorrow by general surgery. Patient will need wound VAC care with change on Monday, Wednesday and Friday per surgery on discharge.  4/22: Hemodynamically stable.  Although preliminary wound cultures were same  few gram-negative rods blood cultures remain negative in 24-hour.  Wound VAC was changed by general surgery today.  They are recommending changing 3 times a week which will be done at his facility.  Facility ordered the wound VAC which will be delivered tomorrow so we will keep him for another day.  Message sent to ID pharmacy to help choose antibiotics.  Assessment and Plan: * Sepsis Patient met sepsis criteria with increased heart rate, source of infection, with mild leukocytosis on admission .  Procalcitonin mildly elevated at 0.11 Blood cultures x 2 -remain negative so far Wound cultures growing few gram-negative rods but overall cultures remain negative. Will continue with cefepime and vancomycin per pharmacy. Message sent to general surgery and ID pharmacy to help with the choice and duration of antibiotic  Sacral decubitus ulcer S/p wound debridement in OR by general surgery.  Per general surgery wound was reaching up to bone, biopsies were taken and wound VAC was placed. Patient has stage IV sacral decubitus ulcer. -Continue with broad-spectrum antibiotics -Continue with wound care  Chronic pain Resumed home hydrocodone-acetaminophen 5-325 mg p.o. every 6 hours as needed for moderate pain  Tremors of nervous system Home gabapentin 100 mg p.o. in the a.m. and 200 mg nightly resumed on admission  History of CVA (cerebrovascular accident) Simvastatin 20 mg nightly resumed Eliquis 5 mg p.o. twice daily not resumed on admission due to anticipating surgical debridement,  -Will restart once cleared from surgery  Memory changes Home risperidone 1.5 mg nightly resumed  History of hypotension Patient was given stress dose steroids by ICU team during February/March 2024 hospitalization which was then discontinued and patient was started on midodrine taper down to 5 mg  3 times daily with meals Midodrine 3 times daily with meals resumed on admission, per last discharge summary this can be  tapered further if patient's blood pressure continued to rise -Currently blood pressure within goal -Continue to monitor  COPD, mild Patient is not in acute exacerbation at this time Resumed home long-acting/maintenance inhaler equivalent with Dulera 2 puff inhalation twice daily Albuterol 2 puff inhalation every 6 hours as needed for wheezing and shortness of breath resumed  Persistent atrial fibrillation Holding home Eliquis until cleared from surgery  Hyperlipidemia associated with type 2 diabetes mellitus Home simvastatin 20 mg nightly resumed  Obstructive sleep apnea on CPAP CPAP nightly resumed  Type 2 diabetes mellitus with morbid obesity Last A1c 09/27/2022, was 6.0 Home Jardiance not resumed on admission Insulin SSI with at bedtime coverage ordered Goal inpatient blood glucose levels 140-180  Esophageal dysmotility PPI resumed  Hypertension associated with diabetes Patient is currently on midodrine and blood pressure remained within goal Hydralazine 5 mg IV every 8 hours as needed for SBP greater 175, 4 days ordered   Metabolic acidosis Resolved with IV fluid -Continue to monitor   Subjective: Patient was seen and examined today.  He was little somnolent and wants to sleep.  Denies any pain and appears comfortable.  He was also appearing little confused might be due to somnolence.  Physical Exam: Vitals:   11/26/22 2345 11/27/22 0317 11/27/22 0400 11/27/22 0748  BP: (!) 96/58 116/66 104/60 115/83  Pulse: 93 83 93 87  Resp: Temp: 98.9 F (37.2 C) 97.8 F (36.6 C) 98.9 F (37.2 C) 97.9 F (36.6 C)  TempSrc: Oral Oral Axillary   SpO2: 94% 96% 95% 95%  Weight:       General.  Somnolent gentleman, in no acute distress. Pulmonary.  Lungs clear bilaterally, normal respiratory effort. CV.  Regular rate and rhythm, no JVD, rub or murmur. Abdomen.  Soft, nontender, nondistended, BS positive. CNS.  Somnolent Extremities.  No edema, no cyanosis,  pulses intact and symmetrical. Psychiatry.  Judgment and insight appears normal.   Data Reviewed: Prior data reviewed  Family Communication: TOC is communicating with legal guardian  Disposition: Status is: Inpatient Remains inpatient appropriate because: Severity of illness  Planned Discharge Destination: Skilled nursing facility  Time spent: 40 minutes  This record has been created using Conservation officer, historic buildings. Errors have been sought and corrected,but may not always be located. Such creation errors do not reflect on the standard of care.   Author: Arnetha Courser, MD 11/27/2022 1:37 PM  For on call review [email or web address].

## 2022-11-27 NOTE — TOC Initial Note (Addendum)
Transition of Care [facility]) - Initial/Assessment Note    Patient Details  Name: Evan [name] MRN: [id number] Date of Birth: [birth year]  Transition of Care [facility]) CM/SW Contact:    Margarito Liner, LCSW Phone Number: 11/27/2022, 12:21 PM  Clinical Narrative: Patient not fully oriented and DSS has legal guardianship. Patient is a long-term resident at [facility] and will return there at discharge. Admissions coordinator is ordering a wound vac and said it will be delivered tomorrow. DSS is aware. No further concerns. CSW will continue to follow patient for support and facilitate return to SNF once stable.                1:27 pm: SNF wants to see if we can get auth approval for wound care. Auth started.   Expected Discharge Plan: Skilled Nursing Facility Barriers to Discharge: Other (must enter comment) (DME delivery)   Patient Goals and CMS Choice     Choice offered to / list presented to : NA      Expected Discharge Plan and Services     Post Acute Care Choice: Resumption of Svcs/PTA Provider Living arrangements for the past 2 months: Skilled Nursing Facility                                      Prior Living Arrangements/Services Living arrangements for the past 2 months: Skilled Nursing Facility Lives with:: Facility Resident Patient language and need for interpreter reviewed:: Yes Do you feel safe going back to the place where you live?: Yes      Need for Family Participation in Patient Care: Yes (Comment) Care giver support system in place?: Yes (comment)   Criminal Activity/Legal Involvement Pertinent to Current Situation/Hospitalization: No - Comment as needed  Activities of Daily Living   ADL Screening (condition at time of admission) Patient's cognitive ability adequate to safely complete daily activities?: No Is the patient deaf or have difficulty hearing?: No Does the patient have difficulty seeing, even when wearing glasses/contacts?: No Does the  patient have difficulty concentrating, remembering, or making decisions?: Yes Patient able to express need for assistance with ADLs?: Yes Does the patient have difficulty dressing or bathing?: Yes Independently performs ADLs?: No Does the patient have difficulty walking or climbing stairs?: Yes Weakness of Legs: Both Weakness of Arms/Hands: None  Permission Sought/Granted Permission sought to share information with : Facility Medical sales representative, Guardian    Share Information with NAME: Sharlyne Cai  Permission granted to share info w AGENCY: [facility] SNF  Permission granted to share info w Relationship: DSS supervisor  Permission granted to share info w Contact Information: [phone]  Emotional Assessment Appearance:: Appears stated age Attitude/Demeanor/Rapport: Unable to Assess Affect (typically observed): Unable to Assess Orientation: : Oriented to Self, Oriented to Place Alcohol / Substance Use: Not Applicable Psych Involvement: No (comment)  Admission diagnosis:  Sepsis [A41.9] Pressure injury of deep tissue of sacral region [L89.156] Patient Active Problem List   Diagnosis Date Noted   Metabolic acidosis 11/26/2022   Sacral decubitus ulcer, stage IV 11/25/2022   Sepsis 11/24/2022   History of hypotension 11/24/2022   Tremors of nervous system 11/24/2022   Chronic pain 11/24/2022   Sacral decubitus ulcer 11/24/2022   Left ventricular hypokinesis inferolateral wall 09/28/2022   Moderate aortic stenosis 09/28/2022   Shock circulatory 09/27/2022   Elevated lactic acid level 09/27/2022   SIRS (systemic inflammatory response  syndrome) 09/26/2022   Leukocytosis 09/26/2022   AKI (acute kidney injury) 09/26/2022   Undifferentiated schizophrenia 12/13/2021   Elevated ferritin level 11/11/2021   Memory changes 08/12/2021   Other thrombophilia 12/25/2020   Vitamin D deficiency 12/04/2020   Vitamin B12 deficiency 12/04/2020   Stenosis of right carotid artery  12/03/2020   History of CVA (cerebrovascular accident) 11/06/2020   History of 2019 novel coronavirus disease (COVID-19) 09/27/2020   Atherosclerosis of aorta 12/04/2019   Persistent atrial fibrillation 09/12/2019   CAD (coronary artery disease) 09/12/2019   Chronic venous stasis 09/12/2019   Hoarding disorder 09/12/2019   Cervical spinal stenosis 09/12/2019   Diabetic retinopathy 09/12/2019   COPD, mild 09/12/2019   Osteoporosis 09/09/2019   History of prostate cancer 09/09/2019   Paranoid schizophrenia 08/10/2019   Obstructive sleep apnea on CPAP 08/10/2019   Hyperlipidemia associated with type 2 diabetes mellitus 08/10/2019   Chronic diastolic CHF (congestive heart failure) 01/09/2014   Esophageal dysmotility 09/12/2013   Type 2 diabetes mellitus with morbid obesity 02/14/2012   Obesity, morbid (more than 100 lbs over ideal weight or BMI > 40) (HCC) 07/26/2011   Old myocardial infarction 03/02/2010   Hypertension associated with diabetes 03/20/2009   CORONARY ATHEROSCLEROSIS, ARTERY BYPASS GRAFT 03/20/2009   PCP:  Melonie Florida, FNP Pharmacy:   [facility] - [place], [place] - [address] [place] [address] Phone: [phone] Fax: [phone]     Social Determinants of Health (SDOH) Social History: SDOH Screenings   Food Insecurity: No Food Insecurity (09/26/2022)  Housing: Low Risk  (09/26/2022)  Transportation Needs: No Transportation Needs (09/26/2022)  Utilities: Not At Risk (09/26/2022)  Alcohol Screen: Low Risk  (12/13/2021)  Depression (PHQ2-9): Medium Risk (10/12/2021)  Financial Resource Strain: Low Risk  (08/22/2021)  Physical Activity: Sufficiently Active (02/25/2021)  Social Connections: Moderately Integrated (05/03/2021)  Stress: No Stress Concern Present (04/14/2021)  Tobacco Use: Low Risk  (11/26/2022)   SDOH Interventions:     Readmission Risk Interventions     No data to display

## 2022-11-27 NOTE — Consult Note (Signed)
   [facility] CM Inpatient Consult   11/27/2022  [name] Mar 06, 1956 [id number]     Location: [facility] RN Hospital Liaison screened remotely [facility]).   [facility] Customer service manager [facility]) Accountable Care Organization [ACO] Patient: Evan [name])    Primary Care Provider:  Melonie Florida, FNP [employer]   Patient screened for readmission hospitalization with noted extreme high risk score for unplanned readmission risk with 2 IP/2 ED in 6 months. Pt not eligible for [facility] services at this time (not on all payors list).    [facility] Care Management/Population Health does not replace or interfere with any arrangements made by the Inpatient Transition of Care team.   For questions contact:    Elliot Cousin, RN, BSN [facility] Liaison [place]   [facility]  Population Health Office Hours MTWF 8:00 am to 6 pm off on Thursday [phone] mobile [phone] [Office toll free line][facility] Office Hours are M-F 8:30 - 5 pm 24 hour nurse advise line [phone] Conceirge  [email or web address]

## 2022-11-27 NOTE — Progress Notes (Signed)
[name] SUR[facility]ICAL PROGRESS NOTE  Hospital Day(s): 3.   Post op day(s): 2 Days Post-Op.   Interval History:  Patient seen and examined No acute events or new complaints overnight.  Patient reports he is doing alright this morning  No new labs this AM Bone Cx without growth Tissue Cx with GNR Bone Biopsy pending He continues on Cefepime, Flagyl, and Vancomycin Diet advanced  Vital signs in last 24 hours: [min-max] current  Temp:  [97.6 F (36.4 C)-99 F (37.2 C)] 98.9 F (37.2 C) (04/22 0400) Pulse Rate:  [83-105] 93 (04/22 0400) Resp:  [16-20] 16 (04/22 0400) BP: (96-116)/(58-68) 104/60 (04/22 0400) SpO2:  [93 %-99 %] 95 % (04/22 0400)       Weight: 84.3 kg     Intake/Output last 2 shifts:  04/21 0701 - 04/22 0700 In: 1380 [P.O.:480; IV Piggyback:900] Out: 1275 [Urine:1275]   Physical Exam:  Constitutional: alert, cooperative and no distress  Respiratory: breathing non-labored at rest  Cardiovascular: regular rate and sinus rhythm  Integumentary: Sacral wound; stage 4; healthy appearing wound bed; bone exposed  Sacral Wound (11/27/2022):    Labs:     Latest Ref Rng & Units 11/26/2022    4:52 AM 11/25/2022    4:53 AM 11/24/2022   10:47 AM  CBC  WBC 4.0 - 10.5 K/uL 10.5  8.4  11.5   Hemoglobin 13.0 - 17.0 g/dL 40.9  81.1  91.4   Hematocrit 39.0 - 52.0 % 33.6  34.0  34.0   Platelets 150 - 400 K/uL 454  419  430       Latest Ref Rng & Units 11/27/2022    5:19 AM 11/26/2022    4:52 AM 11/25/2022    4:53 AM  CMP  Glucose 70 - 99 mg/dL  97  91   BUN 8 - 23 mg/dL  10  9   Creatinine 7.82 - 1.24 mg/dL 9.56  2.13  0.86   Sodium 135 - 145 mmol/L  135  139   Potassium 3.5 - 5.1 mmol/L  4.9  3.0   Chloride 98 - 111 mmol/L  109  109   CO2 22 - 32 mmol/L  15  19   Calcium 8.9 - 10.3 mg/dL  8.0  8.1      Imaging studies: No new pertinent imaging studies   Assessment/Plan:  67 y.o. male 2 Days Post-Op s/p debridement of infected stage 4 sacral  decubitus ulcer with placement of wound vac (>50 cm2)              - Wound Vac MWF; WOC RN on board; we are available to assist             - Continue IV Abx (cefepime, vancomycin); Follow up Cx - Continue local wound care; pressure offloading; frequent repositioning +/- low air loss mattress - Pain control prn             - Further management per primary service   - Nothing further at this time from surgical perspective; we will follow peripherally and be available to aid in vac changes as needed   All of the above findings and recommendations were discussed with the patient, and the medical team, and all of patient's questions were answered to his expressed satisfaction.  -- Lynden Oxford, PA-C [facility] 11/27/2022, 7:32 AM M-F: 7am - 4pm

## 2022-11-27 NOTE — Consult Note (Addendum)
WOC Nurse Consult Note: Reason for Consult: Surgical team at the bedside to assess wound appearance during the first post-op dressing change.  Wound bed is 80% pale pink, 20% patchy areas of slough scattered throughout. Mod amt blood-tinged drainage in the cannister, no active bleeding.  Wound type: Stage 4 pressure injury to sacrum, 9X11X5cm, located in close proximity to the rectum. Pressure Injury POA: Yes Dressing procedure/placement/frequency: Pt was medicated for pain prior to the procedure and tolerated with minimal amt discomfort.  Applied one piece black sponge to cont suction, using a barrier ring to lower wound edges to attempt to maintain a seal, then bridged track pad to hip to reduce pressure.  Air mattress has been ordered according to the EMR but has not arrived yet.  WOC team will plan to change dressing again on Wed.  Thank-you,  Cammie Mcgee MSN, RN, CWOCN, [place], CNS [phone]

## 2022-11-28 DIAGNOSIS — D62 Acute posthemorrhagic anemia: Secondary | ICD-10-CM | POA: Diagnosis not present

## 2022-11-28 DIAGNOSIS — L89156 Pressure-induced deep tissue damage of sacral region: Secondary | ICD-10-CM | POA: Diagnosis not present

## 2022-11-28 LAB — BASIC METABOLIC PANEL
Anion gap: 4 — ABNORMAL LOW (ref 5–15)
BUN: 12 mg/dL (ref 8–23)
CO2: 25 mmol/L (ref 22–32)
Calcium: 8.5 mg/dL — ABNORMAL LOW (ref 8.9–10.3)
Chloride: 113 mmol/L — ABNORMAL HIGH (ref 98–111)
Creatinine, Ser: 0.75 mg/dL (ref 0.61–1.24)
GFR, Estimated: >60 mL/min (ref >60)
Glucose, Bld: 129 mg/dL — ABNORMAL HIGH (ref 70–99)
Potassium: 3.5 mmol/L (ref 3.5–5.1)
Sodium: 142 mmol/L (ref 135–145)

## 2022-11-28 LAB — TYPE AND SCREEN: Antibody Screen: NEGATIVE

## 2022-11-28 LAB — PREPARE RBC (CROSSMATCH)

## 2022-11-28 LAB — CBC
HCT: 34 % — ABNORMAL LOW (ref 39.0–52.0)
Hemoglobin: 10.1 g/dL — ABNORMAL LOW (ref 13.0–17.0)
MCH: 26.1 pg (ref 26.0–34.0)
MCHC: 29.7 g/dL — ABNORMAL LOW (ref 30.0–36.0)
MCV: 87.9 fL (ref 80.0–100.0)
Platelets: 462 10*3/uL — ABNORMAL HIGH (ref 150–400)
RBC: 3.87 MIL/uL — ABNORMAL LOW (ref 4.22–5.81)
RDW: 17.3 % — ABNORMAL HIGH (ref 11.5–15.5)
WBC: 14.5 10*3/uL — ABNORMAL HIGH (ref 4.0–10.5)
nRBC: 0 % (ref 0.0–0.2)

## 2022-11-28 LAB — CULTURE, BLOOD (ROUTINE X 2): Culture: NO GROWTH

## 2022-11-28 LAB — GLUCOSE, CAPILLARY
Glucose-Capillary: 120 mg/dL — ABNORMAL HIGH (ref 70–99)
Glucose-Capillary: 150 mg/dL — ABNORMAL HIGH (ref 70–99)
Glucose-Capillary: 117 mg/dL — ABNORMAL HIGH (ref 70–99)
Glucose-Capillary: 107 mg/dL — ABNORMAL HIGH (ref 70–99)

## 2022-11-28 LAB — HEMOGLOBIN AND HEMATOCRIT, BLOOD
HCT: 27.9 % — ABNORMAL LOW (ref 39.0–52.0)
HCT: 27.6 % — ABNORMAL LOW (ref 39.0–52.0)
Hemoglobin: 8.3 g/dL — ABNORMAL LOW (ref 13.0–17.0)
Hemoglobin: 7.9 g/dL — ABNORMAL LOW (ref 13.0–17.0)

## 2022-11-28 LAB — BPAM RBC
Blood Product Expiration Date: 202404302359
ISSUE DATE / TIME: 202404231717

## 2022-11-28 LAB — SURGICAL PATHOLOGY

## 2022-11-28 MED ORDER — LACTATED RINGERS IV BOLUS
1000.0000 mL | Freq: Once | INTRAVENOUS | Status: AC
  Administered 2022-11-28: 1000 mL via INTRAVENOUS

## 2022-11-28 MED ORDER — MIDODRINE HCL 5 MG PO TABS
5.0000 mg | ORAL_TABLET | ORAL | Status: AC
  Administered 2022-11-28: 5 mg via ORAL
  Filled 2022-11-28: qty 1

## 2022-11-28 MED ORDER — MIDODRINE HCL 5 MG PO TABS
5.0000 mg | ORAL_TABLET | Freq: Three times a day (TID) | ORAL | Status: DC
  Administered 2022-11-28 – 2022-11-30 (×6): 5 mg via ORAL
  Filled 2022-11-28 (×6): qty 1

## 2022-11-28 MED ORDER — SODIUM CHLORIDE 0.9% IV SOLUTION: Freq: Once | INTRAVENOUS | Status: DC

## 2022-11-28 NOTE — Progress Notes (Signed)
Progress Note   Patient: Evan [name] ZOX:[id number] DOB: 04-Jan-1956 DOA: 11/24/2022     4 DOS: the patient was seen and examined on 11/28/2022   Brief hospital course: [name] is a 67 year old male with history of paranoid schizophrenia, hyperlipidemia, CAD, non-insulin-dependent diabetes mellitus, hypertension, obesity, diabetic retinopathy, atrial fibrillation currently on Eliquis, history of CVA, memory loss,  tremors, who for the emergency department for chief concerns of elevated white cell count and worsening sacral wounds from [facility].  Vitals showed temperature of 98.3, respiration rate of 18, heart rate of 103, blood pressure 117/73, SpO2 99% on room air.  Serum sodium is 139, potassium 2.4, chloride 109, bicarb 23, BUN of 12, serum creatinine 0.72, nonfasting blood glucose 128, WBC 11.5, hemoglobin 10.3, platelets of 430.  eGFR greater than 60.  UA was negative for leukocytes and nitrates.  ED treatment: Fentanyl 50 mcg one-time dose, cefepime, vancomycin, sodium chloride 500 mL bolus.  ED provider ordered for PICC line placement as patient has no vascular access.  4/20: Vital stable.  General surgery was consulted and patient will be going to the OR for debridement of his infected unstageable sacral decubitus wound.  Labs with resolution of leukocytosis, potassium at 3, bicarb 19, INR 1.4, preliminary blood cultures negative,PCt 0.11 Magnesium at 1.7.  Both are being repleted.  Patient underwent successful debridement by general surgery, per surgery he has no wound deep to the bone.  Biopsies were taken and wound VAC was placed.  4/21: Remained hemodynamically stable.  Preliminary wound cultures growing gram-negative rods.  Wound vacuum will be changed tomorrow by general surgery. Patient will need wound VAC care with change on Monday, Wednesday and Friday per surgery on discharge.  4/22: Hemodynamically stable.  Although preliminary wound cultures were same  few gram-negative rods blood cultures remain negative in 24-hour.  Wound VAC was changed by general surgery today.  They are recommending changing 3 times a week which will be done at his facility.  Facility ordered the wound VAC which will be delivered tomorrow so we will keep him for another day.  Message sent to ID pharmacy to help choose antibiotics.  4/23: Vitals stable.  Antibiotics were switched to Keflex and Flagyl.  Slight worsening of leukocytosis might be reactive with wound VAC change.  Patient was restarted on his home Eliquis yesterday and noted to have bleeding from his wound today, he was bleeding from 2 vessels next to the bone which were ligated by general surgery.  Hemoglobin decreased to 8.3 on recheck. Eliquis was held again.  We might have to keep holding it for prolonged time until surgery clears him to restart. Hemoglobin with further decrease to 7.8 on recheck in the afternoon-1 unit of PRBC ordered.  Bone biopsy results came back as osteomyelitis but all cultures remain negative. Asking ID to help about antibiotics.  Assessment and Plan: * Sepsis Patient met sepsis criteria with increased heart rate, source of infection, with mild leukocytosis on admission .  Procalcitonin mildly elevated at 0.11 Blood cultures x 2 -remain negative so far Wound cultures growing few gram-negative rods but overall cultures remain negative.  Bone biopsy with osteomyelitis Patient was supposed to go back to his facility on Keflex but started bleeding requiring ligation of a vessel next to bone.  Hemoglobin with more than 2 point decrease. -Ordered 1 unit of PRBC -We will monitor for another day -Message sent to ID to help with antibiotics due to positive bone biopsy  Acute  blood loss anemia Patient started bleeding profusely from his phone this morning.  Surgery was able to ligate bleeding vessel next to bone.  More than 2 point decrease in hemoglobin. Patient was restarted on home Eliquis  yesterday, which was again held. -Received 2 boluses due to softer blood pressure -1 unit of PRBC -Monitor hemoglobin -Keep holding Eliquis until surgery clears him to restart anticoagulation, they might do it as outpatient  Sacral decubitus ulcer S/p wound debridement in OR by general surgery.  Per general surgery wound was reaching up to bone, biopsies were taken and wound VAC was placed. Patient has stage IV sacral decubitus ulcer. -Continue with broad-spectrum antibiotics -Continue with wound care  Chronic pain Resumed home hydrocodone-acetaminophen 5-325 mg p.o. every 6 hours as needed for moderate pain  Tremors of nervous system Home gabapentin 100 mg p.o. in the a.m. and 200 mg nightly resumed on admission  History of CVA (cerebrovascular accident) Simvastatin 20 mg nightly resumed Eliquis 5 mg p.o. twice daily not resumed on admission due to anticipating surgical debridement,  -Will restart once cleared from surgery  Memory changes Home risperidone 1.5 mg nightly resumed  History of hypotension Patient was given stress dose steroids by ICU team during February/March 2024 hospitalization which was then discontinued and patient was started on midodrine taper down to 5 mg 3 times daily with meals Midodrine 3 times daily with meals resumed on admission, per last discharge summary this can be tapered further if patient's blood pressure continued to rise -Currently blood pressure within goal -Continue to monitor  COPD, mild Patient is not in acute exacerbation at this time Resumed home long-acting/maintenance inhaler equivalent with Dulera 2 puff inhalation twice daily Albuterol 2 puff inhalation every 6 hours as needed for wheezing and shortness of breath resumed  Persistent atrial fibrillation Holding home Eliquis until cleared from surgery  Hyperlipidemia associated with type 2 diabetes mellitus Home simvastatin 20 mg nightly resumed  Obstructive sleep apnea on  CPAP CPAP nightly resumed  Type 2 diabetes mellitus with morbid obesity Last A1c 09/27/2022, was 6.0 Home Jardiance not resumed on admission Insulin SSI with at bedtime coverage ordered Goal inpatient blood glucose levels 140-180  Esophageal dysmotility PPI resumed  Hypertension associated with diabetes Patient is currently on midodrine and blood pressure remained within goal Hydralazine 5 mg IV every 8 hours as needed for SBP greater 175, 4 days ordered   Metabolic acidosis Resolved with IV fluid -Continue to monitor   Subjective: Patient is feeling weak when seen today.  He just had significant bleeding during dressing change.  Surgery was able to ligate the vessel.  Physical Exam: Vitals:   11/28/22 1300 11/28/22 1326 11/28/22 1331 11/28/22 1345  BP: (!) 70/58 (!) 88/69 (!) 86/60 (!) 75/64  Pulse: (!) 101 (!) 102 (!) 105 (!) 102  Resp:      Temp:      TempSrc:      SpO2:      Weight:       General. Well developed gentleman, In no acute distress. Pulmonary.  Lungs clear bilaterally, normal respiratory effort. CV.  Regular rate and rhythm, no JVD, rub or murmur. Abdomen.  Soft, nontender, nondistended, BS positive. CNS.  Alert and oriented .  No focal neurologic deficit. Extremities.  No edema, no cyanosis, pulses intact and symmetrical. Psychiatry.  Judgment and insight appears normal.   Data Reviewed: Prior data reviewed  Family Communication: TOC is communicating with legal guardian  Disposition: Status is: Inpatient Remains inpatient  appropriate because: Severity of illness  Planned Discharge Destination: Skilled nursing facility  Time spent: 42 minutes  This record has been created using Conservation officer, historic buildings. Errors have been sought and corrected,but may not always be located. Such creation errors do not reflect on the standard of care.   Author: Arnetha Courser, MD 11/28/2022 3:10 PM  For on call review [email or web address].

## 2022-11-28 NOTE — Progress Notes (Signed)
[facility] SURGICAL PROGRESS NOTE  Hospital Day(s): 4.   Post op day(s): 3 Days Post-Op.   Interval History:  Patient seen and examined Overnight, vac with blood clots and stopped. Seen by Dr Maia Plan and oozing stopped with pressure This morning, patient still somnolent but participates Sacral wound wound with completely saturated chuck and dressing Leukocytosis to 14.5K Hgb stable at 10.1 Bone Cx without growth Tissue Cx with GNR Bone Biopsy pending He continues on Cefepime, Flagyl, and Vancomycin Diet advanced  Vital signs in last 24 hours: [min-max] current  Temp:  [97.8 F (36.6 C)-98.7 F (37.1 C)] 98.2 F (36.8 C) (04/23 0349) Pulse Rate:  [73-109] 106 (04/23 0349) Resp:  [18-20] 18 (04/23 0349) BP: (110-160)/(66-115) 138/85 (04/23 0349) SpO2:  [95 %-100 %] 99 % (04/23 0349) Weight:  [79.4 kg] 79.4 kg (04/23 0500)       Weight: 79.4 kg     Intake/Output last 2 shifts:  04/22 0701 - 04/23 0700 In: 4178.9 [P.O.:440; I.V.:3738.9] Out: 2950 [Urine:2950]   Physical Exam:  Constitutional: alert, cooperative and no distress  Respiratory: breathing non-labored at rest  Cardiovascular: regular rate and sinus rhythm  Integumentary: 9 x 11 x 5 cm sacral wound; wound bed is healthy appearing. There is significant oozing present from vessel in central wound bed. I was able to get this stopped with two figure of eight silk sutures, applied surgical as well, dressing replaced   Sacral Wound (11/28/2022):     Labs:     Latest Ref Rng & Units 11/28/2022    3:44 AM 11/26/2022    4:52 AM 11/25/2022    4:53 AM  CBC  WBC 4.0 - 10.5 K/uL 14.5  10.5  8.4   Hemoglobin 13.0 - 17.0 g/dL 09.8  11.9  14.7   Hematocrit 39.0 - 52.0 % 34.0  33.6  34.0   Platelets 150 - 400 K/uL 462  454  419       Latest Ref Rng & Units 11/28/2022    3:45 AM 11/27/2022   11:01 AM 11/27/2022    5:19 AM  CMP  Glucose 70 - 99 mg/dL 829  562    BUN 8 - 23 mg/dL 12  12    Creatinine  1.30 - 1.24 mg/dL 8.65  7.84  6.96   Sodium 135 - 145 mmol/L 142  141    Potassium 3.5 - 5.1 mmol/L 3.5  3.7    Chloride 98 - 111 mmol/L 113  109    CO2 22 - 32 mmol/L 25  26    Calcium 8.9 - 10.3 mg/dL 8.5  8.3       Imaging studies: No new pertinent imaging studies   Assessment/Plan:  67 y.o. male 3 Days Post-Op s/p debridement of infected stage 4 sacral decubitus ulcer with placement of wound vac (>50 cm2)              - Patient found this morning to have significant bleeding from vessel in central portion of wound. Attempts at hemostasis with pressure were unsuccessful. I was able to place two figue of eight silk sutures with good control (see picture). I did also place surgical in the wound bed and placed dry pressure dressing. Will recheck H&H  - HOLD ANTICOAGULATION   - Hold on wound vac for now             - Continue IV Abx (cefepime, vancomycin); Follow up Cx - Continue local wound care; pressure offloading; frequent  repositioning +/- low air loss mattress - Pain control prn             - Further management per primary service   - Discharge Planning: Certainly not ready for DC  All of the above findings and recommendations were discussed with the patient, and the medical team, and all of patient's questions were answered to his expressed satisfaction.  -- Lynden Oxford, PA-C [facility] 11/28/2022, 7:42 AM M-F: 7am - 4pm

## 2022-11-28 NOTE — Assessment & Plan Note (Signed)
S/p wound debridement in OR by general surgery.  Per general surgery wound was reaching up to bone, biopsies were taken and wound VAC was placed. Patient has stage IV sacral decubitus ulcer. -Continue with broad-spectrum antibiotics -Continue with wound care

## 2022-11-28 NOTE — Progress Notes (Signed)
   11/28/22 0600  Provider Notification  Provider Name/Title Lurline Idol Cintron-Diaz,MD  Date Provider Notified 11/28/22  Time Provider Notified 0600  Method of Notification  (secure chat)  Notification Reason Other (Comment) (large amount of blood  with clots to wound base . Pressure dressing applied.)  Provider response See new orders  Date of Provider Response 11/28/22

## 2022-11-28 NOTE — Progress Notes (Signed)
PA at bedside to evaluate pt bleeding.  He found location and applied pressure and 2 stitches.  Packed w/kerlix and foam.

## 2022-11-28 NOTE — Assessment & Plan Note (Signed)
Patient started bleeding profusely from his phone this morning.  Surgery was able to ligate bleeding vessel next to bone.  More than 2 point decrease in hemoglobin. Patient was restarted on home Eliquis yesterday, which was again held. -Received 2 boluses due to softer blood pressure -1 unit of PRBC -Monitor hemoglobin -Keep holding Eliquis until surgery clears him to restart anticoagulation, they might do it as outpatient

## 2022-11-29 DIAGNOSIS — M869 Osteomyelitis, unspecified: Secondary | ICD-10-CM

## 2022-11-29 DIAGNOSIS — M8618 Other acute osteomyelitis, other site: Secondary | ICD-10-CM | POA: Diagnosis not present

## 2022-11-29 DIAGNOSIS — L89156 Pressure-induced deep tissue damage of sacral region: Secondary | ICD-10-CM | POA: Diagnosis not present

## 2022-11-29 LAB — BPAM RBC: Unit Type and Rh: 6200

## 2022-11-29 LAB — GLUCOSE, CAPILLARY
Glucose-Capillary: 85 mg/dL (ref 70–99)
Glucose-Capillary: 130 mg/dL — ABNORMAL HIGH (ref 70–99)
Glucose-Capillary: 106 mg/dL — ABNORMAL HIGH (ref 70–99)
Glucose-Capillary: 117 mg/dL — ABNORMAL HIGH (ref 70–99)

## 2022-11-29 LAB — CULTURE, BLOOD (ROUTINE X 2): Culture: NO GROWTH

## 2022-11-29 LAB — TYPE AND SCREEN
ABO/RH(D): A POS
Unit division: 0

## 2022-11-29 LAB — CBC
HCT: 25.9 % — ABNORMAL LOW (ref 39.0–52.0)
Hemoglobin: 8.2 g/dL — ABNORMAL LOW (ref 13.0–17.0)
MCH: 27.2 pg (ref 26.0–34.0)
MCHC: 31.7 g/dL (ref 30.0–36.0)
MCV: 85.8 fL (ref 80.0–100.0)
Platelets: 363 10*3/uL (ref 150–400)
RBC: 3.02 MIL/uL — ABNORMAL LOW (ref 4.22–5.81)
RDW: 17.1 % — ABNORMAL HIGH (ref 11.5–15.5)
WBC: 13.2 10*3/uL — ABNORMAL HIGH (ref 4.0–10.5)
nRBC: 0 % (ref 0.0–0.2)

## 2022-11-29 LAB — AEROBIC/ANAEROBIC CULTURE W GRAM STAIN (SURGICAL/DEEP WOUND)

## 2022-11-29 LAB — HEMOGLOBIN AND HEMATOCRIT, BLOOD
HCT: 26.3 % — ABNORMAL LOW (ref 39.0–52.0)
Hemoglobin: 8.3 g/dL — ABNORMAL LOW (ref 13.0–17.0)

## 2022-11-29 MED ORDER — OXYCODONE HCL 5 MG PO TABS
5.0000 mg | ORAL_TABLET | ORAL | Status: DC | PRN
  Administered 2022-11-29 – 2022-11-30 (×4): 5 mg via ORAL
  Filled 2022-11-29 (×4): qty 1

## 2022-11-29 NOTE — Consult Note (Signed)
WOC Nurse wound follow up Wound type: Sacral Stage 4 pressure injury with surgical debridement 11/25/22.  NPWT stopped 11/28/22 due to excessive bleeding and surgicel and pressure dressing applied. No bleeding today.   Ian Malkin, PA at bedside. Patient is medicated for pain during the procedure.  Measurement: 9 cm x 9 cm x 5 cm  Wound bed: 25% scattered dark devitalized tissue, 75% pale pink nongranulating tissue  Palpable coccyx bone Drainage (amount, consistency, odor) moderate serosanguinous  no bleeding today.   Periwound:  Wound extends near rectum.  Periwound protected with barrier ring in apex of gluteal cleft and strips of hydrocolloid along the periwound.   Dressing procedure/placement/frequency:  Cleansed wound with NS.  2 pieces black foam to wound bed.  1 piece black foam over protected, intact skin along buttocks to trochanter for bridging/offload pressure from Trac PAd. Device attached and seal immediately achieved.  Change Mon.Wed.Fri.  Will follow.  Mike Gip MSN, RN, FNP-BC CWON Wound, Ostomy, Continence Nurse Outpatient [facility] - Closed [phone] Pager [phone]

## 2022-11-29 NOTE — Progress Notes (Signed)
[facility] SURGICAL PROGRESS NOTE  Hospital Day(s): 5.   Post op day(s): 4 Days Post-Op.   Interval History:  Patient seen and examined No further bleeding overnight Leukocytosis improved to 13.2K Hgb to 8.2 (stable) Bone Cx without growth Tissue Cx with GNR Bone Biopsy positive for osteomyelitis Now on Flagyl and Keflex  Diet advanced  Vital signs in last 24 hours: [min-max] current  Temp:  [97.8 F (36.6 C)-99 F (37.2 C)] 98.6 F (37 C) (04/24 0440) Pulse Rate:  [83-131] 106 (04/24 0440) Resp:  [16-18] 16 (04/24 0440) BP: (56-115)/(36-73) 115/66 (04/24 0440) SpO2:  [93 %-99 %] 98 % (04/24 0440) Weight:  [68.8 kg] 68.8 kg (04/24 0639)       Weight: 68.8 kg     Intake/Output last 2 shifts:  04/23 0701 - 04/24 0700 In: 1316.7 [P.O.:720; I.V.:250; Blood:346.7] Out: 1350 [Urine:1350]   Physical Exam:  Constitutional: alert, cooperative and no distress  Respiratory: breathing non-labored at rest  Cardiovascular: regular rate and sinus rhythm  Integumentary: 9 x 11 x 5 cm sacral wound; wound bed healthy; no bleeding this AM  Sacral Wound (11/29/2022):     Labs:     Latest Ref Rng & Units 11/29/2022    4:34 AM 11/29/2022   12:03 AM 11/28/2022    1:18 PM  CBC  WBC 4.0 - 10.5 K/uL 13.2     Hemoglobin 13.0 - 17.0 g/dL 8.2  8.3  7.9   Hematocrit 39.0 - 52.0 % 25.9  26.3  27.6   Platelets 150 - 400 K/uL 363         Latest Ref Rng & Units 11/28/2022    3:45 AM 11/27/2022   11:01 AM 11/27/2022    5:19 AM  CMP  Glucose 70 - 99 mg/dL 657  846    BUN 8 - 23 mg/dL 12  12    Creatinine 9.62 - 1.24 mg/dL 9.52  8.41  3.24   Sodium 135 - 145 mmol/L 142  141    Potassium 3.5 - 5.1 mmol/L 3.5  3.7    Chloride 98 - 111 mmol/L 113  109    CO2 22 - 32 mmol/L 25  26    Calcium 8.9 - 10.3 mg/dL 8.5  8.3       Imaging studies: No new pertinent imaging studies   Assessment/Plan:  67 y.o. male 4 Days Post-Op s/p debridement of infected stage 4 sacral  decubitus ulcer with placement of wound vac (>50 cm2)              - Wound vac restarted this AM with help of WOC RN; appreciate assistance  - HOLD ANTICOAGULATION for at least 48 hours before restarting              - Now on Keflex; Flagyl; Bone Biopsy positive for osteomyelitis - Continue local wound care; pressure offloading; frequent repositioning +/- low air loss mattress - Pain control prn             - Further management per primary service   - Discharge Planning: Nothing further from surgical perspective at this time, continue wound vac MWF as feasible. Okay to return to SNF once medically cleared  All of the above findings and recommendations were discussed with the patient, and the medical team, and all of patient's questions were answered to his expressed satisfaction.  -- Lynden Oxford, PA-C [facility] 11/29/2022, 7:14 AM M-F: 7am - 4pm

## 2022-11-29 NOTE — Progress Notes (Addendum)
Progress Note   Patient: Evan [name] XBJ:[id number] DOB: 10-12-1955 DOA: 11/24/2022     5 DOS: the patient was seen and examined on 11/29/2022   Brief hospital course: Mr. Jona Zappone is a 67 year old male with history of paranoid schizophrenia, hyperlipidemia, CAD, non-insulin-dependent diabetes mellitus, hypertension, obesity, diabetic retinopathy, atrial fibrillation currently on Eliquis, history of CVA, memory loss,  tremors, who for the emergency department for chief concerns of elevated white cell count and worsening sacral wounds from [facility].   Vitals showed temperature of 98.3, respiration rate of 18, heart rate of 103, blood pressure 117/73, SpO2 99% on room air.   Serum sodium is 139, potassium 2.4, chloride 109, bicarb 23, BUN of 12, serum creatinine 0.72, nonfasting blood glucose 128, WBC 11.5, hemoglobin 10.3, platelets of 430.  eGFR greater than 60.   UA was negative for leukocytes and nitrates.   ED treatment: Fentanyl 50 mcg one-time dose, cefepime, vancomycin, sodium chloride 500 mL bolus.  ED provider ordered for PICC line placement as patient has no vascular access.   4/20: Vital stable.  General surgery was consulted and patient will be going to the OR for debridement of his infected unstageable sacral decubitus wound.  Labs with resolution of leukocytosis, potassium at 3, bicarb 19, INR 1.4, preliminary blood cultures negative,PCt 0.11 Magnesium at 1.7.  Both are being repleted.   Patient underwent successful debridement by general surgery, per surgery he has no wound deep to the bone.  Biopsies were taken and wound VAC was placed.   4/21: Remained hemodynamically stable.  Preliminary wound cultures growing gram-negative rods.  Wound vacuum will be changed tomorrow by general surgery. Patient will need wound VAC care with change on Monday, Wednesday and Friday per surgery on discharge.   4/22: Hemodynamically stable.  Although preliminary wound cultures  were same few gram-negative rods blood cultures remain negative in 24-hour.  Wound VAC was changed by general surgery today.  They are recommending changing 3 times a week which will be done at his facility.  Facility ordered the wound VAC which will be delivered tomorrow so we will keep him for another day.  Message sent to ID pharmacy to help choose antibiotics.   4/23: Vitals stable.  Antibiotics were switched to Keflex and Flagyl.  Slight worsening of leukocytosis might be reactive with wound VAC change.  Patient was restarted on his home Eliquis yesterday and noted to have bleeding from his wound today, he was bleeding from 2 vessels next to the bone which were ligated by general surgery.  Hemoglobin decreased to 8.3 on recheck. Eliquis was held again.  We might have to keep holding it for prolonged time until surgery clears him to restart. Hemoglobin with further decrease to 7.8 on recheck in the afternoon-1 unit of PRBC ordered.  Bone biopsy results came back as osteomyelitis but all cultures remain negative. Asking ID to help about antibiotics.  4/24: patient is on PO Abx to treat osteomyelitis and has extensive sacral wound s/p debridement and now with wound vac in place. He was not oriented this morning. Suspect this may be due to recent morphine dose. This may also be why he had lower blood pressures.   Assessment and Plan: Sepsis Patient met sepsis criteria with increased heart rate, source of infection, with mild leukocytosis on admission .  Procalcitonin mildly elevated at 0.11. BxCx x2 NGTD Wound cultures growing few gram-negative rods but overall cultures remain negative. Bone biopsy from sacral wound debridement positive for  osteomyelitis - ID consulted, appreciate recs  - continue Keflex PO - continue IV fluids while altered for decreased PO intake  Acute blood loss anemia- from sacral wound on 4/23. Surgery was able to ligate bleeding vessel next to bone. Hgb 10.1> 7.9. received 1  unit pRBCs and now hgb 8.2 without significant amount of further bleeding. Received 2 boluses due to softer blood pressure - continue to hold eliquis -Monitor hemoglobin  Stage IV Sacral decubitus ulcer. From [facility] nursing facility S/p wound debridement in OR 4/20.  Per general surgery wound was reaching up to bone, biopsies were taken and wound VAC was placed. -Continue with antibiotics -Continue with wound care  Chronic pain Resumed home hydrocodone-acetaminophen 5-325 mg p.o. every 6 hours as needed for moderate pain  Tremors of nervous system Home gabapentin 100 mg p.o. in the a.m. and 200 mg nightly resumed on admission  History of CVA (cerebrovascular accident) Simvastatin 20 mg nightly resumed Eliquis 5 mg p.o. twice daily not resumed,  -Will restart once cleared from surgery  Memory changes Home risperidone 1.5 mg nightly resumed  Hypotension Patient was given stress dose steroids by ICU team during February/March 2024 hospitalization which was then discontinued and patient was started on midodrine taper down to 5 mg 3 times daily with meals. Remains mildly hypotensive.  - continue MidodrineTID -Continue to monitor  COPD, mild Patient is not in acute exacerbation at this time Resumed home long-acting/maintenance inhaler equivalent with Dulera 2 puff inhalation twice daily Albuterol 2 puff inhalation every 6 hours as needed for wheezing and shortness of breath resumed  Persistent atrial fibrillation Holding home Eliquis until cleared from surgery  Hyperlipidemia associated with type 2 diabetes mellitus Home simvastatin 20 mg nightly resumed  Obstructive sleep apnea on CPAP CPAP nightly resumed  Type 2 diabetes mellitus with morbid obesity Last A1c 09/27/2022, was 6.0 Home Jardiance not resumed on admission Insulin SSI with at bedtime coverage ordered Goal inpatient blood glucose levels 140-180  Esophageal dysmotility PPI resumed  Metabolic  acidosis Resolved with IV fluid -Continue to monitor   Subjective: Patient is alert to voice but not able to orient. He is confused to all questions and unable to follow commands. He states that he has to pee. They may be due to recent morphine and wound vac placement per his nurse. This in combination with his hypotension and low hgb and seeing some blood on his sheet still, I would like to monitor overnight in hospital. May be ready to dc to SNF tomorrow.   Physical Exam: Vitals:   11/29/22 0001 11/29/22 0021 11/29/22 0440 11/29/22 0639  BP: 103/66 94/65 115/66   Pulse: 100 100 (!) 106   Resp:  16 16   Temp:  98.5 F (36.9 C) 98.6 F (37 C)   TempSrc:  Oral Oral   SpO2: 97% 98% 98%   Weight:    68.8 kg   General.  obese, In no acute distress. Pulmonary.  Lungs clear bilaterally, normal respiratory effort. CV.  Regular rate and rhythm, no JVD, rub or murmur. Abdomen.  Soft, nontender, nondistended, BS positive. CNS.  Alert to voice. Speaking in non-coherent manner.  No focal neurologic deficit. Extremities.  No edema, no cyanosis, pulses intact and symmetrical. Psychiatry.  Judgment and insight appears impaired.   Data Reviewed: Prior data reviewed  Family Communication: TOC is communicating with legal guardian  Disposition: Status is: Inpatient Remains inpatient appropriate because: Severity of illness  Planned Discharge Destination: Skilled nursing facility  Time spent: 45 minutes   Author: Leeroy Bock, MD 11/29/2022 7:59 AM  For on call review [email or web address].

## 2022-11-29 NOTE — TOC Progression Note (Addendum)
Transition of Care [facility]) - Progression Note    Patient Details  Name: Evan [name] MRN: [id number] Date of Birth: [birth year]  Transition of Care [facility]) CM/SW Contact  Truddie Hidden, RN Phone Number: 11/29/2022, 1:07 PM  Clinical Narrative:    Per Ellender Hose [facility] Admissions Coordinator, wound vac has arrived at the facility. SNf [facility] has been approved through 4/25. MD notified and DSS guardian notified.    Expected Discharge Plan: Skilled Nursing Facility Barriers to Discharge: Other (must enter comment) (DME delivery)  Expected Discharge Plan and Services     Post Acute Care Choice: Resumption of Svcs/PTA Provider Living arrangements for the past 2 months: Skilled Nursing Facility                                       Social Determinants of Health (SDOH) Interventions SDOH Screenings   Food Insecurity: No Food Insecurity (09/26/2022)  Housing: Low Risk  (09/26/2022)  Transportation Needs: No Transportation Needs (09/26/2022)  Utilities: Not At Risk (09/26/2022)  Alcohol Screen: Low Risk  (12/13/2021)  Depression (PHQ2-9): Medium Risk (10/12/2021)  Financial Resource Strain: Low Risk  (08/22/2021)  Physical Activity: Sufficiently Active (02/25/2021)  Social Connections: Moderately Integrated (05/03/2021)  Stress: No Stress Concern Present (04/14/2021)  Tobacco Use: Low Risk  (11/26/2022)    Readmission Risk Interventions     No data to display

## 2022-11-30 DIAGNOSIS — I9589 Other hypotension: Secondary | ICD-10-CM | POA: Diagnosis not present

## 2022-11-30 DIAGNOSIS — Z7401 Bed confinement status: Secondary | ICD-10-CM | POA: Diagnosis not present

## 2022-11-30 DIAGNOSIS — R6889 Other general symptoms and signs: Secondary | ICD-10-CM | POA: Diagnosis not present

## 2022-11-30 DIAGNOSIS — I10 Essential (primary) hypertension: Secondary | ICD-10-CM | POA: Diagnosis not present

## 2022-11-30 DIAGNOSIS — G629 Polyneuropathy, unspecified: Secondary | ICD-10-CM | POA: Diagnosis not present

## 2022-11-30 DIAGNOSIS — I69398 Other sequelae of cerebral infarction: Secondary | ICD-10-CM | POA: Diagnosis not present

## 2022-11-30 DIAGNOSIS — J449 Chronic obstructive pulmonary disease, unspecified: Secondary | ICD-10-CM | POA: Diagnosis not present

## 2022-11-30 DIAGNOSIS — R509 Fever, unspecified: Secondary | ICD-10-CM | POA: Diagnosis not present

## 2022-11-30 DIAGNOSIS — R52 Pain, unspecified: Secondary | ICD-10-CM | POA: Diagnosis not present

## 2022-11-30 DIAGNOSIS — E119 Type 2 diabetes mellitus without complications: Secondary | ICD-10-CM | POA: Diagnosis not present

## 2022-11-30 DIAGNOSIS — M8610 Other acute osteomyelitis, unspecified site: Secondary | ICD-10-CM

## 2022-11-30 DIAGNOSIS — M6281 Muscle weakness (generalized): Secondary | ICD-10-CM | POA: Diagnosis not present

## 2022-11-30 DIAGNOSIS — E1140 Type 2 diabetes mellitus with diabetic neuropathy, unspecified: Secondary | ICD-10-CM | POA: Diagnosis not present

## 2022-11-30 DIAGNOSIS — I4891 Unspecified atrial fibrillation: Secondary | ICD-10-CM | POA: Diagnosis not present

## 2022-11-30 DIAGNOSIS — J309 Allergic rhinitis, unspecified: Secondary | ICD-10-CM | POA: Diagnosis not present

## 2022-11-30 DIAGNOSIS — L98419 Non-pressure chronic ulcer of buttock with unspecified severity: Secondary | ICD-10-CM | POA: Diagnosis not present

## 2022-11-30 DIAGNOSIS — G4700 Insomnia, unspecified: Secondary | ICD-10-CM | POA: Diagnosis not present

## 2022-11-30 DIAGNOSIS — I959 Hypotension, unspecified: Secondary | ICD-10-CM | POA: Diagnosis not present

## 2022-11-30 DIAGNOSIS — Z743 Need for continuous supervision: Secondary | ICD-10-CM | POA: Diagnosis not present

## 2022-11-30 DIAGNOSIS — L89156 Pressure-induced deep tissue damage of sacral region: Secondary | ICD-10-CM | POA: Diagnosis not present

## 2022-11-30 DIAGNOSIS — S31000D Unspecified open wound of lower back and pelvis without penetration into retroperitoneum, subsequent encounter: Secondary | ICD-10-CM | POA: Diagnosis not present

## 2022-11-30 LAB — AEROBIC/ANAEROBIC CULTURE W GRAM STAIN (SURGICAL/DEEP WOUND)

## 2022-11-30 LAB — CBC
HCT: 25.3 % — ABNORMAL LOW (ref 39.0–52.0)
Hemoglobin: 7.9 g/dL — ABNORMAL LOW (ref 13.0–17.0)
MCH: 27.2 pg (ref 26.0–34.0)
MCHC: 31.2 g/dL (ref 30.0–36.0)
MCV: 87.2 fL (ref 80.0–100.0)
Platelets: 377 10*3/uL (ref 150–400)
RBC: 2.9 MIL/uL — ABNORMAL LOW (ref 4.22–5.81)
RDW: 17.4 % — ABNORMAL HIGH (ref 11.5–15.5)
WBC: 11.7 10*3/uL — ABNORMAL HIGH (ref 4.0–10.5)
nRBC: 0 % (ref 0.0–0.2)

## 2022-11-30 LAB — GLUCOSE, CAPILLARY: Glucose-Capillary: 98 mg/dL (ref 70–99)

## 2022-11-30 MED ORDER — CEPHALEXIN 500 MG PO CAPS: 500.0000 mg | ORAL_CAPSULE | Freq: Three times a day (TID) | ORAL | 0 refills | Status: DC

## 2022-11-30 MED ORDER — HYDROCODONE-ACETAMINOPHEN 5-325 MG PO TABS: 1.0000 | ORAL_TABLET | Freq: Four times a day (QID) | ORAL | 0 refills | Status: AC | PRN

## 2022-11-30 MED ORDER — METRONIDAZOLE 500 MG PO TABS: 500.0000 mg | ORAL_TABLET | Freq: Two times a day (BID) | ORAL | 0 refills | Status: DC

## 2022-11-30 NOTE — Discharge Instructions (Addendum)
Wound vac changes: --Please change wound vac dressing on a MWF schedule, and as needed if there are any issues where the wound vac is off for more than 2 hours.   --Use black sponge for dressing.  Would be good to bridge the dressing towards either hip so he's not sitting on the suction disc/tubing. --May need to use a portion of barrier ring towards the anal verge to allow the drape to stick better in that area. --Settings:  125 mmHg, continuous

## 2022-11-30 NOTE — TOC Transition Note (Signed)
Transition of Care [facility]) - CM/SW Discharge Note   Patient Details  Name: Evan [name] MRN: [id number] Date of Birth: 04/28/1956  Transition of Care [facility]) CM/SW Contact:  Margarito Liner, LCSW Phone Number: 11/30/2022, 11:35 AM   Clinical Narrative:   Patient has orders to discharge back to [facility] today. RN will call report to [phone] (Room 223A). EMS transport has been set up for 12:30. MD will sign Norco prescription. No further concerns. CSW signing off.  Final next level of care: Skilled Nursing Facility Barriers to Discharge: Barriers Resolved   Patient Goals and CMS Choice   Choice offered to / list presented to : NA  Discharge Placement     Existing PASRR number confirmed : 11/27/22          Patient chooses bed at: [facility] Patient to be transferred to facility by: EMS Name of family member notified: Sharlyne Cai. Left voicemail for [employer]. Patient and family notified of of transfer: 11/30/22  Discharge Plan and Services Additional resources added to the After Visit Summary for       Post Acute Care Choice: Resumption of Svcs/PTA Provider                               Social Determinants of Health (SDOH) Interventions SDOH Screenings   Food Insecurity: No Food Insecurity (09/26/2022)  Housing: Low Risk  (09/26/2022)  Transportation Needs: No Transportation Needs (09/26/2022)  Utilities: Not At Risk (09/26/2022)  Alcohol Screen: Low Risk  (12/13/2021)  Depression (PHQ2-9): Medium Risk (10/12/2021)  Financial Resource Strain: Low Risk  (08/22/2021)  Physical Activity: Sufficiently Active (02/25/2021)  Social Connections: Moderately Integrated (05/03/2021)  Stress: No Stress Concern Present (04/14/2021)  Tobacco Use: Low Risk  (11/26/2022)     Readmission Risk Interventions     No data to display

## 2022-11-30 NOTE — Progress Notes (Signed)
[facility] SURGICAL PROGRESS NOTE  Hospital Day(s): 6.   Post op day(s): 5 Days Post-Op.   Interval History:  Patient seen and examined No further bleeding overnight Wound vac drainage serosanguinous; minimal  Leukocytosis improved to 11.7K Hgb to 7.9 (stable) Bone Cx without growth Tissue Cx with GNR Bone Biopsy positive for osteomyelitis Now on Flagyl and Keflex  Diet advanced  Vital signs in last 24 hours: [min-max] current  Temp:  [97.8 F (36.6 C)-98.8 F (37.1 C)] 98.8 F (37.1 C) (04/25 0407) Pulse Rate:  [61-117] 61 (04/25 0407) Resp:  [17-21] 18 (04/24 2255) BP: (97-118)/(63-80) 103/65 (04/25 0407) SpO2:  [97 %-100 %] 100 % (04/25 0407)       Weight: 68.8 kg     Intake/Output last 2 shifts:  04/24 0701 - 04/25 0700 In: 1957.9 [P.O.:1440; I.V.:517.9] Out: 900 [Urine:900]   Physical Exam:  Constitutional: alert, cooperative and no distress  Respiratory: breathing non-labored at rest  Cardiovascular: regular rate and sinus rhythm  Integumentary: 9 x 11 x 5 cm sacral wound; wound vac with good seal; vac output serosanguinous (minimal)    Labs:     Latest Ref Rng & Units 11/30/2022    4:04 AM 11/29/2022    4:34 AM 11/29/2022   12:03 AM  CBC  WBC 4.0 - 10.5 K/uL 11.7  13.2    Hemoglobin 13.0 - 17.0 g/dL 7.9  8.2  8.3   Hematocrit 39.0 - 52.0 % 25.3  25.9  26.3   Platelets 150 - 400 K/uL 377  363        Latest Ref Rng & Units 11/28/2022    3:45 AM 11/27/2022   11:01 AM 11/27/2022    5:19 AM  CMP  Glucose 70 - 99 mg/dL 284  132    BUN 8 - 23 mg/dL 12  12    Creatinine 4.40 - 1.24 mg/dL 1.02  7.25  3.66   Sodium 135 - 145 mmol/L 142  141    Potassium 3.5 - 5.1 mmol/L 3.5  3.7    Chloride 98 - 111 mmol/L 113  109    CO2 22 - 32 mmol/L 25  26    Calcium 8.9 - 10.3 mg/dL 8.5  8.3       Imaging studies: No new pertinent imaging studies   Assessment/Plan:  67 y.o. male 5 Days Post-Op s/p debridement of infected stage 4 sacral decubitus  ulcer with placement of wound vac (>50 cm2).              - Wound vac restarted this AM with help of WOC RN; appreciate assistance  - HOLD ANTICOAGULATION for at least another 24 hours before restarting              - Now on Keflex; Flagyl; Bone Biopsy positive for osteomyelitis - Continue local wound care; pressure offloading; frequent repositioning +/- low air loss mattress - Pain control prn             - Further management per primary service   - Discharge Planning: Nothing further from surgical perspective at this time, continue wound vac MWF as feasible. Okay to return to SNF once medically cleared  All of the above findings and recommendations were discussed with the patient, and the medical team, and all of patient's questions were answered to his expressed satisfaction.  -- Lynden Oxford, PA-C [facility] 11/30/2022, 8:07 AM M-F: 7am - 4pm

## 2022-11-30 NOTE — Progress Notes (Signed)
Attempted to call report to [facility] at [phone].  On hold for 10 minutes - no response from facility.

## 2022-11-30 NOTE — Discharge Summary (Addendum)
Physician Discharge Summary  Patient: Evan [name] BJY:[id number] DOB: 1956/02/16   Code Status: Full Code Admit date: 11/24/2022 Discharge date: 11/30/2022 Disposition: Skilled nursing facility, PT, OT, nurse aid, RN, and wound care PCP: Melonie Florida, FNP  Recommendations for Outpatient Follow-up:  Follow up with PCP within 1-2 weeks Regarding general hospital follow up and preventative care Recommend reviewing blood pressure and diabetes medications. Recommend evaluation for duration of antibiotics. Consider ID consult Eliquis was held on discharge due to the contribution to bleeding from his wound while admitted requiring a blood transfusion but would recommend restarting if indicated upon follow up if his wound remains stable and without significant bleeding. Surgery sutured the bleeding vessel and has had good hemostasis for several days and tolerating the wound vac.  Follow up with general surgery and wound care Regarding wound healing and wound vac management  Discharge Diagnoses:  Principal Problem:   Sepsis Active Problems:   Acute blood loss anemia   Sacral decubitus ulcer   Chronic pain   Tremors of nervous system   History of CVA (cerebrovascular accident)   Memory changes   History of hypotension   COPD, mild   Persistent atrial fibrillation   Hyperlipidemia associated with type 2 diabetes mellitus   Obstructive sleep apnea on CPAP   Type 2 diabetes mellitus with morbid obesity   Esophageal dysmotility   Hypertension associated with diabetes   Sacral decubitus ulcer, stage IV   Metabolic acidosis   Pyogenic inflammation of bone  Brief Hospital Course Summary: Evan [name] is a 67 year old male with history of paranoid schizophrenia, HLD, CAD, non-insulin-dependent diabetes mellitus, HTN, obesity, diabetic retinopathy, Afib on Eliquis, history of CVA, memory loss,  tremors.  They presented from [facility] manner to ED for  elevated white cell count and  worsening sacral wounds.   ED course: Vitals showed temperature of 98.3, respiration rate of 18, heart rate of 103, blood pressure 117/73, SpO2 99% on room air.   Serum sodium is 139, potassium 2.4, chloride 109, bicarb 23, BUN of 12, serum creatinine 0.72, nonfasting blood glucose 128, WBC 11.5, hemoglobin 10.3, platelets of 430.  eGFR greater than 60.   UA was negative for leukocytes and nitrates.   ED treatment: Fentanyl 50 mcg one-time dose, cefepime, vancomycin, sodium chloride 500 mL bolus.  ED provider ordered for PICC line placement as patient has no vascular access.   4/20: Vital stable.  General surgery was consulted and patient will be going to the OR for debridement of his infected unstageable sacral decubitus wound.  Labs with resolution of leukocytosis, potassium at 3, bicarb 19, INR 1.4, preliminary blood cultures negative,PCt 0.11 Magnesium at 1.7.  Both are being repleted.   Patient underwent successful debridement by general surgery, per surgery he has no wound deep to the bone.  Biopsies were taken and wound VAC was placed.   4/21: Remained hemodynamically stable.  Preliminary wound cultures growing gram-negative rods.  Wound vacuum will be changed tomorrow by general surgery. Patient will need wound VAC care with change on Monday, Wednesday and Friday per surgery on discharge.   4/22: Hemodynamically stable.  Although preliminary wound cultures were same few gram-negative rods blood cultures remain negative in 24-hour.  Wound VAC was changed by general surgery today. ID consulted for Abx choice.   4/23: Vitals stable.  Antibiotics were switched to Keflex and Flagyl.  Slight worsening of leukocytosis might be reactive with wound VAC change.  Patient was restarted on his  home Eliquis yesterday and noted to have bleeding from his wound today, he was bleeding from 2 vessels next to the bone which were ligated by general surgery.  Hemoglobin decreased to 8.3 on recheck. Eliquis was  held again.   Hemoglobin with further decrease to 7.8 on recheck in the afternoon-1 unit of PRBC ordered.  Bone biopsy results came back as osteomyelitis but all cultures remain negative. Asking ID to help about antibiotics.   4/24: patient is on PO Abx to treat osteomyelitis and has extensive sacral wound s/p debridement and now with wound vac in place. He was not oriented this morning. Suspect this may be due to recent morphine dose for his wound dressing change. This may also be why he had lower blood pressures.  4/25: patient is alert and oriented and able to follow directions. Complains of discomfort to his wound. No more bleeding from the wound. Wound vac is in place properly. His PO Antibiotics will be continued. Duration to be determined based on his healing trajectory on follow up.  Eliquis is held still to avoid bleeding and can be restarted if appropriate at hospital follow up appointment.   All other chronic conditions were treated with home medications.   Discharge Condition: Stable, improved Recommended discharge diet: Regular healthy diet  Consultations: General surgery  ID  Procedures/Studies: Surgical debridement of sacral ulcers.  Wound vac Blood transfusion x1 unit  Allergies as of 11/30/2022       Reactions   Penicillin G Hives   Sulfa Antibiotics Hives   Tiotropium    Zoloft [sertraline Hcl] Other (See Comments)        Medication List     STOP taking these medications    apixaban 5 MG Tabs tablet Commonly known as: ELIQUIS   clindamycin 300 MG capsule Commonly known as: CLEOCIN   linezolid 600 MG tablet Commonly known as: ZYVOX   multivitamin with minerals tablet   vancomycin  IVPB       TAKE these medications    acetaminophen 500 MG tablet Commonly known as: TYLENOL Take 500 mg by mouth every 8 (eight) hours as needed for mild pain.   albuterol 108 (90 Base) MCG/ACT inhaler Commonly known as: VENTOLIN HFA Inhale 2 puffs into the  lungs every 6 (six) hours as needed for wheezing or shortness of breath.   Bacid Caps Take 1 tablet by mouth every 12 (twelve) hours.   cephALEXin 500 MG capsule Commonly known as: KEFLEX Take 1 capsule (500 mg total) by mouth 4 (four) times daily -  before meals and at bedtime.   cetirizine 10 MG tablet Commonly known as: ZYRTEC Take 10 mg by mouth at bedtime.   cyanocobalamin 1000 MCG tablet Commonly known as: VITAMIN B12 Take 1 tablet (1,000 mcg total) by mouth daily.   empagliflozin 10 MG Tabs tablet Commonly known as: Jardiance Take 1 tablet (10 mg total) by mouth daily.   feeding supplement Liqd Take 237 mLs by mouth 2 (two) times daily between meals.   gabapentin 100 MG capsule Commonly known as: NEURONTIN Take 100-200 mg by mouth See admin instructions. Take 1 capsule (100mg ) by mouth every morning and take 2 capsules (200mg ) by mouth every night at bedtime   HYDROcodone-acetaminophen 5-325 MG tablet Commonly known as: NORCO/VICODIN Take 1 tablet by mouth every 6 (six) hours as needed for up to 7 days for moderate pain.   magnesium hydroxide 400 MG/5ML suspension Commonly known as: MILK OF MAGNESIA Take 5 mLs by mouth  daily as needed for mild constipation.   melatonin 5 MG Tabs Take 1 tablet (5 mg total) by mouth at bedtime as needed.   metroNIDAZOLE 500 MG tablet Commonly known as: Flagyl Take 1 tablet (500 mg total) by mouth 2 (two) times daily for 14 days.   midodrine 5 MG tablet Commonly known as: PROAMATINE Take 1 tablet (5 mg total) by mouth 3 (three) times daily with meals.   mirtazapine 7.5 MG tablet Commonly known as: REMERON Take 7.5 mg by mouth at bedtime.   multivitamin with minerals Tabs tablet Take 1 tablet by mouth daily.   nitroGLYCERIN 0.4 MG SL tablet Commonly known as: NITROSTAT Place 1 tablet (0.4 mg total) under the tongue every 5 (five) minutes as needed for chest pain.   nystatin cream Commonly known as: MYCOSTATIN Apply  topically 2 (two) times daily.   pantoprazole 40 MG tablet Commonly known as: PROTONIX Take 1 tablet (40 mg total) by mouth daily.   risperiDONE 3 MG tablet Commonly known as: RISPERDAL Take 1.5 mg by mouth at bedtime.   senna 8.6 MG Tabs tablet Commonly known as: SENOKOT Take 2 tablets by mouth in the morning.   simvastatin 20 MG tablet Commonly known as: ZOCOR Take 20 mg by mouth at bedtime.   Vitamin D-3 125 MCG (5000 UT) Tabs Take 5,000 Units by mouth daily.   Wixela Inhub 100-50 MCG/ACT Aepb Generic drug: fluticasone-salmeterol Inhale 1 puff into the lungs 2 (two) times daily.        Contact information for after-discharge care     Destination     [facility] .   Service: Skilled Nursing Contact information: [address] [place] [address] [phone]                    Subjective   Pt reports feeling alright overall today. He has discomfort over his wound and wonders why he needs the wound vac. He denies any new bleeding. No SOB, CP, nausea, fever.   All questions and concerns were addressed at time of discharge.  Objective  Blood pressure 102/75, pulse 90, temperature 98.2 F (36.8 C), resp. rate 16, weight 68.8 kg, SpO2 99 %.   General: Pt is alert, awake, not in acute distress Cardiovascular: RRR, S1/S2 +, no rubs, no gallops Respiratory: CTA bilaterally, no wheezing, no rhonchi Abdominal: obese, Soft, NT, ND, bowel sounds + Extremities: no edema, no cyanosis GU: wound vac in place with good seal. Serosanguinous fluid draining.  The results of significant diagnostics from this hospitalization (including imaging, microbiology, ancillary and laboratory) are listed below for reference.   Imaging studies: DG Chest 2 View  Result Date: 11/24/2022 CLINICAL DATA:  Cough EXAM: CHEST - 2 VIEW COMPARISON:  Chest x-ray dated September 27, 2022 FINDINGS: Patient is rotated to the right which somewhat  limits evaluation. Cardiac and mediastinal contours are unchanged post median sternotomy. Elevation of the right hemidiaphragm. Mild bibasilar opacities which are likely due to atelectasis. No evidence of pneumothorax. Trace bilateral pleural effusions. IMPRESSION: 1. Mild bibasilar opacities which are likely due to atelectasis. 2. Trace bilateral pleural effusions. Electronically Signed   By: Allegra Lai M.D.   On: 11/24/2022 11:41    Labs: Basic Metabolic Panel: Recent Labs  Lab 11/24/22 1047 11/25/22 0453 11/25/22 0806 11/26/22 0452 11/27/22 0519 11/27/22 1101 11/28/22 0345  NA 139 139  --  135  --  141 142  K 3.4* 3.0*  --  4.9  --  3.7 3.5  CL 109 109  --  109  --  109 113*  CO2 23 19*  --  15*  --  26 25  GLUCOSE 128* 91  --  97  --  113* 129*  BUN 12 9  --  10  --  12 12  CREATININE 0.72 0.58*  --  0.75 0.76 0.78 0.75  CALCIUM 8.2* 8.1*  --  8.0*  --  8.3* 8.5*  MG  --   --  1.7 2.2  --   --   --    CBC: Recent Labs  Lab 11/25/22 0453 11/26/22 0452 11/28/22 0344 11/28/22 0952 11/28/22 1318 11/29/22 0003 11/29/22 0434 11/30/22 0404  WBC 8.4 10.5 14.5*  --   --   --  13.2* 11.7*  HGB 10.3* 10.4* 10.1* 8.3* 7.9* 8.3* 8.2* 7.9*  HCT 34.0* 33.6* 34.0* 27.9* 27.6* 26.3* 25.9* 25.3*  MCV 86.7 85.1 87.9  --   --   --  85.8 87.2  PLT 419* 454* 462*  --   --   --  363 377   Microbiology: Results for orders placed or performed during the hospital encounter of 11/24/22  Blood culture (routine x 2)     Status: None   Collection Time: 11/24/22 12:32 PM   Specimen: BLOOD  Result Value Ref Range Status   Specimen Description BLOOD LEFT ANTECUBITAL  Final   Special Requests   Final    BOTTLES DRAWN AEROBIC AND ANAEROBIC Blood Culture results may not be optimal due to an excessive volume of blood received in culture bottles   Culture   Final    NO GROWTH 5 DAYS Performed at [facility], [address] [place], [place] [address]    Report Status 11/29/2022  FINAL  Final  Blood culture (routine x 2)     Status: None   Collection Time: 11/24/22 12:39 PM   Specimen: BLOOD  Result Value Ref Range Status   Specimen Description BLOOD RIGHT ANTECUBITAL  Final   Special Requests   Final    BOTTLES DRAWN AEROBIC AND ANAEROBIC Blood Culture adequate volume   Culture   Final    NO GROWTH 5 DAYS Performed at [facility], [address] [place], [place] [address]    Report Status 11/29/2022 FINAL  Final  Aerobic/Anaerobic Culture w Gram Stain (surgical/deep wound)     Status: None (Preliminary result)   Collection Time: 11/25/22 11:39 AM   Specimen: Wound; Tissue  Result Value Ref Range Status   Specimen Description TISSUE  Final   Special Requests SACRAL WD  Final   Gram Stain   Final    RARE WBC PRESENT, PREDOMINANTLY PMN FEW GRAM NEGATIVE RODS    Culture   Final    NO GROWTH 4 DAYS NO ANAEROBES ISOLATED; CULTURE IN PROGRESS FOR 5 DAYS Performed at [facility], [address] [place], [place] [address]    Report Status PENDING  Incomplete  Aerobic/Anaerobic Culture w Gram Stain (surgical/deep wound)     Status: None (Preliminary result)   Collection Time: 11/25/22 11:39 AM   Specimen: Bone; Tissue  Result Value Ref Range Status   Specimen Description   Final    BONE Performed at [facility], [address], [place], [place] [address]    Special Requests SACRAL WD  Final   Gram Stain NO ORGANISMS SEEN NO WBC SEEN   Final   Culture  Final    NO GROWTH 4 DAYS NO ANAEROBES ISOLATED; CULTURE IN PROGRESS FOR 5 DAYS Performed at [facility], [address] [place], [place] [address]    Report Status PENDING  Incomplete   Time coordinating discharge: Over 30 minutes  Leeroy Bock, MD  [facility] 11/30/2022, 11:18 AM

## 2022-11-30 NOTE — Care Management Important Message (Signed)
Important Message  Patient Details  Name: Evan [name] MRN: [id number] Date of Birth: [birth year]   Medicare Important Message Given:  Yes  Reviewed Medicare IM with Lenn Sink, legal guardian at [phone].  Copy of Medicare IM faxed to [name]'s attention at [phone].   [name] 11/30/2022, 11:57 AM

## 2022-12-01 DIAGNOSIS — G629 Polyneuropathy, unspecified: Secondary | ICD-10-CM | POA: Diagnosis not present

## 2022-12-01 DIAGNOSIS — I4891 Unspecified atrial fibrillation: Secondary | ICD-10-CM | POA: Diagnosis not present

## 2022-12-01 DIAGNOSIS — J449 Chronic obstructive pulmonary disease, unspecified: Secondary | ICD-10-CM | POA: Diagnosis not present

## 2022-12-01 DIAGNOSIS — S31000D Unspecified open wound of lower back and pelvis without penetration into retroperitoneum, subsequent encounter: Secondary | ICD-10-CM | POA: Diagnosis not present

## 2022-12-01 DIAGNOSIS — G4700 Insomnia, unspecified: Secondary | ICD-10-CM | POA: Diagnosis not present

## 2022-12-01 DIAGNOSIS — J309 Allergic rhinitis, unspecified: Secondary | ICD-10-CM | POA: Diagnosis not present

## 2022-12-01 DIAGNOSIS — E119 Type 2 diabetes mellitus without complications: Secondary | ICD-10-CM | POA: Diagnosis not present

## 2022-12-01 DIAGNOSIS — I959 Hypotension, unspecified: Secondary | ICD-10-CM | POA: Diagnosis not present

## 2022-12-04 DIAGNOSIS — S31000D Unspecified open wound of lower back and pelvis without penetration into retroperitoneum, subsequent encounter: Secondary | ICD-10-CM | POA: Diagnosis not present

## 2022-12-04 DIAGNOSIS — R52 Pain, unspecified: Secondary | ICD-10-CM | POA: Diagnosis not present

## 2022-12-05 DIAGNOSIS — J449 Chronic obstructive pulmonary disease, unspecified: Secondary | ICD-10-CM | POA: Diagnosis not present

## 2022-12-05 DIAGNOSIS — L98419 Non-pressure chronic ulcer of buttock with unspecified severity: Secondary | ICD-10-CM | POA: Diagnosis not present

## 2022-12-05 DIAGNOSIS — E119 Type 2 diabetes mellitus without complications: Secondary | ICD-10-CM | POA: Diagnosis not present

## 2022-12-05 DIAGNOSIS — M6281 Muscle weakness (generalized): Secondary | ICD-10-CM | POA: Diagnosis not present

## 2022-12-08 ENCOUNTER — Inpatient Hospital Stay
Admission: EM | Admit: 2022-12-08 | Discharge: 2022-12-13 | DRG: 871 | Disposition: A | Discharge status: Skilled Nursing Facility | Payer: Medicare Other | Source: Skilled Nursing Facility | Attending: Internal Medicine | Admitting: Internal Medicine

## 2022-12-08 ENCOUNTER — Emergency Department: Payer: Medicare Other

## 2022-12-08 ENCOUNTER — Other Ambulatory Visit: Payer: Self-pay

## 2022-12-08 DIAGNOSIS — I110 Hypertensive heart disease with heart failure: Secondary | ICD-10-CM | POA: Diagnosis present

## 2022-12-08 DIAGNOSIS — I252 Old myocardial infarction: Secondary | ICD-10-CM

## 2022-12-08 DIAGNOSIS — I4819 Other persistent atrial fibrillation: Secondary | ICD-10-CM | POA: Diagnosis present

## 2022-12-08 DIAGNOSIS — J4489 Other specified chronic obstructive pulmonary disease: Secondary | ICD-10-CM | POA: Diagnosis present

## 2022-12-08 DIAGNOSIS — Z888 Allergy status to other drugs, medicaments and biological substances status: Secondary | ICD-10-CM

## 2022-12-08 DIAGNOSIS — Z880 Allergy status to penicillin: Secondary | ICD-10-CM | POA: Diagnosis not present

## 2022-12-08 DIAGNOSIS — E785 Hyperlipidemia, unspecified: Secondary | ICD-10-CM | POA: Diagnosis present

## 2022-12-08 DIAGNOSIS — J449 Chronic obstructive pulmonary disease, unspecified: Secondary | ICD-10-CM | POA: Diagnosis present

## 2022-12-08 DIAGNOSIS — E1169 Type 2 diabetes mellitus with other specified complication: Secondary | ICD-10-CM | POA: Diagnosis present

## 2022-12-08 DIAGNOSIS — A419 Sepsis, unspecified organism: Secondary | ICD-10-CM | POA: Diagnosis not present

## 2022-12-08 DIAGNOSIS — Z951 Presence of aortocoronary bypass graft: Secondary | ICD-10-CM | POA: Diagnosis not present

## 2022-12-08 DIAGNOSIS — I25810 Atherosclerosis of coronary artery bypass graft(s) without angina pectoris: Secondary | ICD-10-CM | POA: Diagnosis present

## 2022-12-08 DIAGNOSIS — E11319 Type 2 diabetes mellitus with unspecified diabetic retinopathy without macular edema: Secondary | ICD-10-CM | POA: Diagnosis present

## 2022-12-08 DIAGNOSIS — Z1152 Encounter for screening for COVID-19: Secondary | ICD-10-CM

## 2022-12-08 DIAGNOSIS — Z882 Allergy status to sulfonamides status: Secondary | ICD-10-CM

## 2022-12-08 DIAGNOSIS — Z955 Presence of coronary angioplasty implant and graft: Secondary | ICD-10-CM

## 2022-12-08 DIAGNOSIS — F200 Paranoid schizophrenia: Secondary | ICD-10-CM | POA: Diagnosis present

## 2022-12-08 DIAGNOSIS — Z8249 Family history of ischemic heart disease and other diseases of the circulatory system: Secondary | ICD-10-CM | POA: Diagnosis not present

## 2022-12-08 DIAGNOSIS — L89154 Pressure ulcer of sacral region, stage 4: Secondary | ICD-10-CM | POA: Diagnosis present

## 2022-12-08 DIAGNOSIS — I2510 Atherosclerotic heart disease of native coronary artery without angina pectoris: Secondary | ICD-10-CM | POA: Diagnosis present

## 2022-12-08 DIAGNOSIS — Z8616 Personal history of COVID-19: Secondary | ICD-10-CM

## 2022-12-08 DIAGNOSIS — E669 Obesity, unspecified: Secondary | ICD-10-CM | POA: Diagnosis present

## 2022-12-08 DIAGNOSIS — Z8673 Personal history of transient ischemic attack (TIA), and cerebral infarction without residual deficits: Secondary | ICD-10-CM

## 2022-12-08 DIAGNOSIS — G4733 Obstructive sleep apnea (adult) (pediatric): Secondary | ICD-10-CM | POA: Diagnosis present

## 2022-12-08 DIAGNOSIS — I700 Atherosclerosis of aorta: Secondary | ICD-10-CM | POA: Diagnosis present

## 2022-12-08 DIAGNOSIS — Z7984 Long term (current) use of oral hypoglycemic drugs: Secondary | ICD-10-CM | POA: Diagnosis not present

## 2022-12-08 DIAGNOSIS — Z7901 Long term (current) use of anticoagulants: Secondary | ICD-10-CM | POA: Diagnosis not present

## 2022-12-08 DIAGNOSIS — Z83438 Family history of other disorder of lipoprotein metabolism and other lipidemia: Secondary | ICD-10-CM

## 2022-12-08 DIAGNOSIS — F4310 Post-traumatic stress disorder, unspecified: Secondary | ICD-10-CM | POA: Diagnosis present

## 2022-12-08 DIAGNOSIS — M4628 Osteomyelitis of vertebra, sacral and sacrococcygeal region: Secondary | ICD-10-CM | POA: Diagnosis present

## 2022-12-08 DIAGNOSIS — Z6837 Body mass index (BMI) 37.0-37.9, adult: Secondary | ICD-10-CM

## 2022-12-08 DIAGNOSIS — K219 Gastro-esophageal reflux disease without esophagitis: Secondary | ICD-10-CM | POA: Diagnosis present

## 2022-12-08 DIAGNOSIS — Z79899 Other long term (current) drug therapy: Secondary | ICD-10-CM

## 2022-12-08 DIAGNOSIS — I4891 Unspecified atrial fibrillation: Secondary | ICD-10-CM | POA: Diagnosis not present

## 2022-12-08 DIAGNOSIS — E1151 Type 2 diabetes mellitus with diabetic peripheral angiopathy without gangrene: Secondary | ICD-10-CM | POA: Diagnosis present

## 2022-12-08 DIAGNOSIS — E119 Type 2 diabetes mellitus without complications: Secondary | ICD-10-CM

## 2022-12-08 LAB — COMPREHENSIVE METABOLIC PANEL
ALT: 11 U/L (ref 0–44)
AST: 23 U/L (ref 15–41)
Albumin: 2.1 g/dL — ABNORMAL LOW (ref 3.5–5.0)
Alkaline Phosphatase: 99 U/L (ref 38–126)
Anion gap: 7 (ref 5–15)
BUN: 7 mg/dL — ABNORMAL LOW (ref 8–23)
CO2: 22 mmol/L (ref 22–32)
Calcium: 8 mg/dL — ABNORMAL LOW (ref 8.9–10.3)
Chloride: 108 mmol/L (ref 98–111)
Creatinine, Ser: 0.67 mg/dL (ref 0.61–1.24)
GFR, Estimated: >60 mL/min (ref >60)
Glucose, Bld: 156 mg/dL — ABNORMAL HIGH (ref 70–99)
Potassium: 3.5 mmol/L (ref 3.5–5.1)
Sodium: 137 mmol/L (ref 135–145)
Total Bilirubin: 0.5 mg/dL (ref 0.3–1.2)
Total Protein: 5.3 g/dL — ABNORMAL LOW (ref 6.5–8.1)

## 2022-12-08 LAB — CBC WITH DIFFERENTIAL/PLATELET
Abs Immature Granulocytes: 0.06 10*3/uL (ref 0.00–0.07)
Basophils Absolute: 0.1 10*3/uL (ref 0.0–0.1)
Basophils Relative: 0 %
Eosinophils Absolute: 0.7 10*3/uL — ABNORMAL HIGH (ref 0.0–0.5)
Eosinophils Relative: 5 %
HCT: 29 % — ABNORMAL LOW (ref 39.0–52.0)
Hemoglobin: 8.6 g/dL — ABNORMAL LOW (ref 13.0–17.0)
Immature Granulocytes: 1 %
Lymphocytes Relative: 12 %
Lymphs Abs: 1.5 10*3/uL (ref 0.7–4.0)
MCH: 27.1 pg (ref 26.0–34.0)
MCHC: 29.7 g/dL — ABNORMAL LOW (ref 30.0–36.0)
MCV: 91.5 fL (ref 80.0–100.0)
Monocytes Absolute: 0.8 10*3/uL (ref 0.1–1.0)
Monocytes Relative: 6 %
Neutro Abs: 9.6 10*3/uL — ABNORMAL HIGH (ref 1.7–7.7)
Neutrophils Relative %: 76 %
Platelets: 432 10*3/uL — ABNORMAL HIGH (ref 150–400)
RBC: 3.17 MIL/uL — ABNORMAL LOW (ref 4.22–5.81)
RDW: 20.9 % — ABNORMAL HIGH (ref 11.5–15.5)
WBC: 12.7 10*3/uL — ABNORMAL HIGH (ref 4.0–10.5)
nRBC: 0 % (ref 0.0–0.2)

## 2022-12-08 LAB — URINALYSIS, W/ REFLEX TO CULTURE (INFECTION SUSPECTED)
Bacteria, UA: NONE SEEN
Bilirubin Urine: NEGATIVE
Glucose, UA: >=500 mg/dL — AB
Ketones, ur: NEGATIVE mg/dL
Leukocytes,Ua: NEGATIVE
Nitrite: NEGATIVE
Protein, ur: 30 mg/dL — AB
RBC / HPF: >50 RBC/hpf (ref 0–5)
Specific Gravity, Urine: 1.014 (ref 1.005–1.030)
Squamous Epithelial / HPF: NONE SEEN /HPF (ref 0–5)
pH: 8 (ref 5.0–8.0)

## 2022-12-08 LAB — LACTIC ACID, PLASMA
Lactic Acid, Venous: 2.6 mmol/L (ref 0.5–1.9)
Lactic Acid, Venous: 2 mmol/L (ref 0.5–1.9)

## 2022-12-08 LAB — CBG MONITORING, ED: Glucose-Capillary: 107 mg/dL — ABNORMAL HIGH (ref 70–99)

## 2022-12-08 LAB — PROTIME-INR
INR: 1.2 (ref 0.8–1.2)
Prothrombin Time: 14.9 seconds (ref 11.4–15.2)

## 2022-12-08 LAB — LIPASE, BLOOD: Lipase: 34 U/L (ref 11–51)

## 2022-12-08 LAB — RESP PANEL BY RT-PCR (RSV, FLU A&B, COVID)  RVPGX2
Influenza A by PCR: NEGATIVE
Influenza B by PCR: NEGATIVE
Resp Syncytial Virus by PCR: NEGATIVE
SARS Coronavirus 2 by RT PCR: NEGATIVE

## 2022-12-08 LAB — APTT: aPTT: 33 seconds (ref 24–36)

## 2022-12-08 MED ORDER — GABAPENTIN 100 MG PO CAPS: 100.0000 mg | ORAL_CAPSULE | ORAL | Status: DC

## 2022-12-08 MED ORDER — GABAPENTIN 100 MG PO CAPS
200.0000 mg | ORAL_CAPSULE | Freq: Every day | ORAL | Status: DC
  Administered 2022-12-08 – 2022-12-12 (×5): 200 mg via ORAL
  Filled 2022-12-08 (×5): qty 2

## 2022-12-08 MED ORDER — SODIUM CHLORIDE 0.9 % IV SOLN
2.0000 g | Freq: Once | INTRAVENOUS | Status: AC
  Administered 2022-12-08: 2 g via INTRAVENOUS
  Filled 2022-12-08: qty 12.5

## 2022-12-08 MED ORDER — ACETAMINOPHEN 650 MG RE SUPP: 650.0000 mg | Freq: Four times a day (QID) | RECTAL | Status: DC | PRN

## 2022-12-08 MED ORDER — RISAQUAD PO CAPS
1.0000 | ORAL_CAPSULE | Freq: Two times a day (BID) | ORAL | Status: DC
  Administered 2022-12-08 – 2022-12-13 (×10): 1 via ORAL
  Filled 2022-12-08 (×10): qty 1

## 2022-12-08 MED ORDER — NITROGLYCERIN 0.4 MG SL SUBL: 0.4000 mg | SUBLINGUAL_TABLET | SUBLINGUAL | Status: DC | PRN

## 2022-12-08 MED ORDER — APIXABAN 5 MG PO TABS
5.0000 mg | ORAL_TABLET | Freq: Two times a day (BID) | ORAL | Status: DC
  Administered 2022-12-08 – 2022-12-13 (×10): 5 mg via ORAL
  Filled 2022-12-08 (×10): qty 1

## 2022-12-08 MED ORDER — SODIUM CHLORIDE 0.9 % IV BOLUS
500.0000 mL | Freq: Once | INTRAVENOUS | Status: AC
  Administered 2022-12-08: 500 mL via INTRAVENOUS

## 2022-12-08 MED ORDER — LORATADINE 10 MG PO TABS
10.0000 mg | ORAL_TABLET | Freq: Every day | ORAL | Status: DC
  Administered 2022-12-09 – 2022-12-13 (×5): 10 mg via ORAL
  Filled 2022-12-08 (×5): qty 1

## 2022-12-08 MED ORDER — PANTOPRAZOLE SODIUM 40 MG PO TBEC
40.0000 mg | DELAYED_RELEASE_TABLET | Freq: Every day | ORAL | Status: DC
  Administered 2022-12-09 – 2022-12-13 (×5): 40 mg via ORAL
  Filled 2022-12-08 (×5): qty 1

## 2022-12-08 MED ORDER — SIMVASTATIN 20 MG PO TABS
20.0000 mg | ORAL_TABLET | Freq: Every day | ORAL | Status: DC
  Administered 2022-12-08 – 2022-12-12 (×5): 20 mg via ORAL
  Filled 2022-12-08 (×3): qty 1
  Filled 2022-12-08: qty 2
  Filled 2022-12-08: qty 1
  Filled 2022-12-08 (×2): qty 2

## 2022-12-08 MED ORDER — IOHEXOL 300 MG/ML  SOLN
100.0000 mL | Freq: Once | INTRAMUSCULAR | Status: AC | PRN
  Administered 2022-12-08: 100 mL via INTRAVENOUS

## 2022-12-08 MED ORDER — ACETAMINOPHEN 325 MG PO TABS
650.0000 mg | ORAL_TABLET | Freq: Four times a day (QID) | ORAL | Status: DC | PRN
  Filled 2022-12-08: qty 2

## 2022-12-08 MED ORDER — LACTATED RINGERS IV SOLN
150.0000 mL/h | INTRAVENOUS | Status: DC
  Administered 2022-12-08: 150 mL/h via INTRAVENOUS

## 2022-12-08 MED ORDER — ADULT MULTIVITAMIN W/MINERALS CH
1.0000 | ORAL_TABLET | Freq: Every day | ORAL | Status: DC
  Administered 2022-12-09 – 2022-12-12 (×4): 1 via ORAL
  Filled 2022-12-08 (×5): qty 1

## 2022-12-08 MED ORDER — CEFAZOLIN SODIUM-DEXTROSE 1-4 GM/50ML-% IV SOLN: 1.0000 g | Freq: Three times a day (TID) | INTRAVENOUS | Status: DC

## 2022-12-08 MED ORDER — MIRTAZAPINE 15 MG PO TABS
7.5000 mg | ORAL_TABLET | Freq: Every day | ORAL | Status: DC
  Administered 2022-12-08 – 2022-12-12 (×5): 7.5 mg via ORAL
  Filled 2022-12-08 (×5): qty 1

## 2022-12-08 MED ORDER — VANCOMYCIN HCL IN DEXTROSE 1-5 GM/200ML-% IV SOLN
1000.0000 mg | Freq: Once | INTRAVENOUS | Status: AC
  Administered 2022-12-08: 1000 mg via INTRAVENOUS
  Filled 2022-12-08: qty 200

## 2022-12-08 MED ORDER — ENSURE ENLIVE PO LIQD
237.0000 mL | Freq: Two times a day (BID) | ORAL | Status: DC
  Administered 2022-12-09 – 2022-12-11 (×3): 237 mL via ORAL

## 2022-12-08 MED ORDER — ENOXAPARIN SODIUM 40 MG/0.4ML IJ SOSY: 40.0000 mg | PREFILLED_SYRINGE | INTRAMUSCULAR | Status: DC

## 2022-12-08 MED ORDER — MELATONIN 5 MG PO TABS: 5.0000 mg | ORAL_TABLET | Freq: Every evening | ORAL | Status: DC | PRN

## 2022-12-08 MED ORDER — RISPERIDONE 1 MG PO TABS
1.5000 mg | ORAL_TABLET | Freq: Every day | ORAL | Status: DC
  Administered 2022-12-08 – 2022-12-12 (×5): 1.5 mg via ORAL
  Filled 2022-12-08 (×2): qty 1
  Filled 2022-12-08: qty 2
  Filled 2022-12-08 (×2): qty 1

## 2022-12-08 MED ORDER — ONDANSETRON HCL 4 MG PO TABS: 4.0000 mg | ORAL_TABLET | Freq: Four times a day (QID) | ORAL | Status: DC | PRN

## 2022-12-08 MED ORDER — ONDANSETRON HCL 4 MG/2ML IJ SOLN: 4.0000 mg | Freq: Four times a day (QID) | INTRAMUSCULAR | Status: DC | PRN

## 2022-12-08 MED ORDER — VITAMIN D 25 MCG (1000 UNIT) PO TABS
5000.0000 [IU] | ORAL_TABLET | Freq: Every day | ORAL | Status: DC
  Administered 2022-12-09 – 2022-12-13 (×5): 5000 [IU] via ORAL
  Filled 2022-12-08 (×5): qty 5

## 2022-12-08 MED ORDER — METRONIDAZOLE 500 MG/100ML IV SOLN
500.0000 mg | Freq: Once | INTRAVENOUS | Status: AC
  Administered 2022-12-08: 500 mg via INTRAVENOUS
  Filled 2022-12-08: qty 100

## 2022-12-08 MED ORDER — ALBUTEROL SULFATE (2.5 MG/3ML) 0.083% IN NEBU
2.5000 mg | INHALATION_SOLUTION | Freq: Four times a day (QID) | RESPIRATORY_TRACT | Status: DC | PRN
  Administered 2022-12-12: 2.5 mg via RESPIRATORY_TRACT
  Filled 2022-12-08: qty 3

## 2022-12-08 MED ORDER — MIDODRINE HCL 5 MG PO TABS: 5.0000 mg | ORAL_TABLET | Freq: Three times a day (TID) | ORAL | Status: DC

## 2022-12-08 MED ORDER — ACETAMINOPHEN 325 MG PO TABS
650.0000 mg | ORAL_TABLET | Freq: Once | ORAL | Status: AC
  Administered 2022-12-08: 650 mg via ORAL
  Filled 2022-12-08: qty 2

## 2022-12-08 MED ORDER — ACETAMINOPHEN 500 MG PO TABS
500.0000 mg | ORAL_TABLET | Freq: Three times a day (TID) | ORAL | Status: DC | PRN
  Administered 2022-12-08 – 2022-12-10 (×2): 500 mg via ORAL
  Filled 2022-12-08 (×2): qty 1

## 2022-12-08 MED ORDER — SODIUM CHLORIDE 0.9 % IV SOLN
2.0000 g | INTRAVENOUS | Status: DC
  Administered 2022-12-08 – 2022-12-10 (×3): 2 g via INTRAVENOUS
  Filled 2022-12-08 (×3): qty 20

## 2022-12-08 MED ORDER — LACTATED RINGERS IV BOLUS (SEPSIS)
1000.0000 mL | Freq: Once | INTRAVENOUS | Status: AC
  Administered 2022-12-08: 1000 mL via INTRAVENOUS

## 2022-12-08 MED ORDER — GABAPENTIN 100 MG PO CAPS
100.0000 mg | ORAL_CAPSULE | Freq: Every day | ORAL | Status: DC
  Administered 2022-12-09 – 2022-12-13 (×5): 100 mg via ORAL
  Filled 2022-12-08 (×5): qty 1

## 2022-12-08 MED ORDER — MOMETASONE FURO-FORMOTEROL FUM 100-5 MCG/ACT IN AERO
2.0000 | INHALATION_SPRAY | Freq: Two times a day (BID) | RESPIRATORY_TRACT | Status: DC
  Administered 2022-12-09 – 2022-12-13 (×9): 2 via RESPIRATORY_TRACT
  Filled 2022-12-08: qty 8.8

## 2022-12-08 MED ORDER — LACTATED RINGERS IV SOLN: 150.0000 mL/h | INTRAVENOUS | Status: DC

## 2022-12-08 MED ORDER — VITAMIN B-12 1000 MCG PO TABS
1000.0000 ug | ORAL_TABLET | Freq: Every day | ORAL | Status: DC
  Administered 2022-12-09 – 2022-12-13 (×5): 1000 ug via ORAL
  Filled 2022-12-08: qty 2
  Filled 2022-12-08 (×4): qty 1

## 2022-12-08 MED ORDER — SODIUM CHLORIDE 0.9 % IV BOLUS
1000.0000 mL | Freq: Once | INTRAVENOUS | Status: AC
  Administered 2022-12-08: 1000 mL via INTRAVENOUS

## 2022-12-08 MED ORDER — SENNA 8.6 MG PO TABS
2.0000 | ORAL_TABLET | Freq: Every morning | ORAL | Status: DC
  Administered 2022-12-09 – 2022-12-13 (×5): 17.2 mg via ORAL
  Filled 2022-12-08 (×5): qty 2

## 2022-12-08 NOTE — Assessment & Plan Note (Signed)
Continue Eliquis for secondary prophylaxis for an acute stroke

## 2022-12-08 NOTE — Consult Note (Signed)
PHARMACY -  BRIEF ANTIBIOTIC NOTE   Pharmacy has received consult(s) for cefepime and vancomycin from an ED provider.  The patient's profile has been reviewed for ht/wt/allergies/indication/available labs.    One time order(s) placed for cefepime 2 g IV and vancomycin 1000 mg IV  Further antibiotics/pharmacy consults should be ordered by admitting physician if indicated.                       Thank you,  Will M. Dareen Piano, PharmD PGY-1 Pharmacy Resident 12/08/2022 4:43 PM

## 2022-12-08 NOTE — Assessment & Plan Note (Signed)
Continue risperidone and mirtazapine

## 2022-12-08 NOTE — Assessment & Plan Note (Signed)
Continue as needed bronchodilator therapy as well as inhaled steroids 

## 2022-12-08 NOTE — Sepsis Progress Note (Signed)
eLink is following this Code Sepsis. °

## 2022-12-08 NOTE — Assessment & Plan Note (Signed)
Patient was hospitalized from 4/19 - 4/25 for management of an infected decubitus ulcer He is status postdebridement and had a bone biopsy which was consistent with acute osteomyelitis Will continue IV antibiotics Rocephin Consult ID for further recommendation

## 2022-12-08 NOTE — Assessment & Plan Note (Signed)
Last hemoglobin A1c was 6 Maintain consistent carbohydrate diet Check blood sugars AC meals

## 2022-12-08 NOTE — H&P (Signed)
History and Physical    Patient: Evan [name] WUJ:[id number] DOB: [birth year] DOA: 12/08/2022 DOS: the patient was seen and examined on 12/08/2022 PCP: Melonie Florida, FNP  Patient coming from: [facility]  Chief Complaint:  Chief Complaint  Patient presents with   Weakness   Most of the history was obtained from the EMR.  Patient unable to provide any history. HPI: Evan [name] is a 67 y.o. male with medical history significant for paranoid schizophrenia, coronary artery disease, non-insulin-dependent diabetes mellitus with complications of diabetic retinopathy, hypertension, obesity who was recently discharged from the hospital following treatment for an infected sacral decubitus ulcer.  He was discharged to the skilled nursing facility with a wound VAC and on antibiotic therapy to treat sacral osteomyelitis. Patient was sent to the emergency room from the skilled nursing facility for evaluation of weakness and hypotension.  He was noted to have systolic blood pressure in the 80s.  He had a Tmax of 100.4 and was tachycardic with heart rate of 120. Patient is oriented only to person, not to place or time and so I am unable to do review of systems. Abnormal labs include a white count of 12.7, lactic acid 2.6 >> 2.0 Chest x-ray reviewed by me shows cardiomegaly with central pulmonary vascular congestion. Atelectatic changes in the right lower lung. CT scan of chest/abdomen/pelvis shows decubitus ulcer at the level of the sacrum. Associated cortical erosion of the distal most S5 level and coccyx suggestive of osteomyelitis. Recommend correlation with MRI (with intravenous contrast if GFR greater than 30). Trace to small right and trace left pleural effusions. Question acute pancreatitis-correlate with lipase levels. Likely reactive changes of the duodenum. Cholelithiasis no CT finding of acute cholecystitis. If clinically indicated, consider right upper quadrant ultrasound for further  evaluation. Decompressed urinary bladder with associated wall thickening and trace perivesicular fat stranding. Consider correlation with urinalysis to exclude infection in the setting of a Foley catheter in appropriate position. Diffuse subcutaneus soft tissue edema along the abdomen and pelvis. Aortic Atherosclerosis (ICD10-I70.0) including four-vessel coronary artery and aortic valve leaflet calcifications-correlate for aortic stenosis. Status post coronary artery bypass graft. Patient received 2.5 L fluid bolus in the ER with improvement in his blood pressure as well as IV antibiotics with cefepime, Flagyl and vancomycin and will be admitted to the hospital for further evaluation.     Review of Systems: unable to review all systems due to the inability of the patient to answer questions. Past Medical History:  Diagnosis Date   Anginal pain (HCC)    Anxiety disorder    Asthma    Atresia of esophagus without fistula    CAD (coronary artery disease)    Cellulitis    CHF (congestive heart failure) (HCC)    NYHA CLASS III,CHRONIC,DIASTOLIC   COPD (chronic obstructive pulmonary disease) (HCC)    COVID-19    Diabetes mellitus without complication (HCC)    Edema    RIGHT LOWER LEG   Gastroesophageal reflux    H/O: GI bleed    History of pneumonia    Remote   History of scarlet fever    Childhood   Hyperlipidemia    Hypertension    Myocardial infarction (HCC) 2009   Obesity    Obstructive sleep apnea    Pain    CHRONIC BACK / ABDOMINAL   Panic disorder    Peripheral venous insufficiency    PTSD (post-traumatic stress disorder)    Retinopathy    DIABETIC   Stasis,  venous    Stroke [facility])    Vertigo    Past Surgical History:  Procedure Laterality Date   CARDIAC CATHETERIZATION     CATARACT EXTRACTION Left    CATARACT EXTRACTION W/PHACO Right 05/03/2017   Procedure: CATARACT EXTRACTION PHACO AND INTRAOCULAR LENS PLACEMENT (IOC);  Surgeon: Lockie Mola, MD;  Location:  [facility] ORS;  Service: Ophthalmology;  Laterality: Right;  [place] 00:35.3 AP% 12.3 CDE 4.33 Fluid Pack lot # [id number]        COLONOSCOPY WITH PROPOFOL N/A 11/26/2020   Procedure: COLONOSCOPY WITH PROPOFOL;  Surgeon: Wyline Mood, MD;  Location: [facility] ENDOSCOPY;  Service: Gastroenterology;  Laterality: N/A;  ANNETTE TO PICK UP [phone]   CORONARY ANGIOPLASTY WITH STENT PLACEMENT  2002   CORONARY ANGIOPLASTY WITH STENT PLACEMENT  1999   CORONARY ARTERY BYPASS GRAFT     x7   ESOPHAGOGASTRODUODENOSCOPY N/A 09/19/2016   Procedure: ESOPHAGOGASTRODUODENOSCOPY (EGD);  Surgeon: Christena Deem, MD;  Location: [facility] ENDOSCOPY;  Service: Endoscopy;  Laterality: N/A;   ESOPHAGOGASTRODUODENOSCOPY (EGD) WITH PROPOFOL  11/26/2020   Procedure: ESOPHAGOGASTRODUODENOSCOPY (EGD) WITH PROPOFOL;  Surgeon: Wyline Mood, MD;  Location: [facility] ENDOSCOPY;  Service: Gastroenterology;;   WOUND DEBRIDEMENT N/A 11/25/2022   Procedure: DEBRIDEMENT WOUND;  Surgeon: Henrene Dodge, MD;  Location: [facility] ORS;  Service: General;  Laterality: N/A;   Social History:  reports that he has never smoked. He has never used smokeless tobacco. He reports that he does not drink alcohol and does not use drugs.  Allergies  Allergen Reactions   Penicillin G Hives   Sulfa Antibiotics Hives   Tiotropium    Zoloft [Sertraline Hcl] Other (See Comments)    Family History  Problem Relation Age of Onset   Heart attack Mother    Hypertension Mother    Hyperlipidemia Mother    Heart attack Brother 57       MI   Coronary artery disease Other     Prior to Admission medications   Medication Sig Start Date End Date Taking? Authorizing Provider  acetaminophen (TYLENOL) 500 MG tablet Take 500 mg by mouth every 8 (eight) hours as needed for mild pain.    [provider]  albuterol (VENTOLIN HFA) 108 (90 Base) MCG/ACT inhaler Inhale 2 puffs into the lungs every 6 (six) hours as needed for wheezing or shortness of breath. 12/16/21   Clapacs,  Jackquline Denmark, MD  cephALEXin (KEFLEX) 500 MG capsule Take 1 capsule (500 mg total) by mouth 4 (four) times daily -  before meals and at bedtime. 11/30/22 12/30/22  Leeroy Bock, MD  cetirizine (ZYRTEC) 10 MG tablet Take 10 mg by mouth at bedtime.    [provider]  Cholecalciferol (VITAMIN D-3) 125 MCG (5000 UT) TABS Take 5,000 Units by mouth daily.    [provider]  empagliflozin (JARDIANCE) 10 MG TABS tablet Take 1 tablet (10 mg total) by mouth daily. 12/16/21   Clapacs, Jackquline Denmark, MD  feeding supplement (ENSURE ENLIVE / ENSURE PLUS) LIQD Take 237 mLs by mouth 2 (two) times daily between meals. 10/07/22   Darlin Priestly, MD  fluticasone-salmeterol [facility] INHUB) 100-50 MCG/ACT AEPB Inhale 1 puff into the lungs 2 (two) times daily.    [provider]  gabapentin (NEURONTIN) 100 MG capsule Take 100-200 mg by mouth See admin instructions. Take 1 capsule (100mg ) by mouth every morning and take 2 capsules (200mg ) by mouth every night at bedtime    [provider]  magnesium hydroxide (MILK OF MAGNESIA) 400 MG/5ML suspension  Take 5 mLs by mouth daily as needed for mild constipation.    [provider]  melatonin 5 MG TABS Take 1 tablet (5 mg total) by mouth at bedtime as needed. 10/06/22   Darlin Priestly, MD  metroNIDAZOLE (FLAGYL) 500 MG tablet Take 1 tablet (500 mg total) by mouth 2 (two) times daily for 14 days. 11/30/22 12/14/22  Leeroy Bock, MD  midodrine (PROAMATINE) 5 MG tablet Take 1 tablet (5 mg total) by mouth 3 (three) times daily with meals. 10/06/22   Darlin Priestly, MD  mirtazapine (REMERON) 7.5 MG tablet Take 7.5 mg by mouth at bedtime.    [provider]  Multiple Vitamin (MULTIVITAMIN WITH MINERALS) TABS tablet Take 1 tablet by mouth daily. 10/07/22   Darlin Priestly, MD  nitroGLYCERIN (NITROSTAT) 0.4 MG SL tablet Place 1 tablet (0.4 mg total) under the tongue every 5 (five) minutes as needed for chest pain. 12/16/21   Clapacs, Jackquline Denmark, MD  nystatin cream  (MYCOSTATIN) Apply topically 2 (two) times daily. 08/28/22   [provider]  pantoprazole (PROTONIX) 40 MG tablet Take 1 tablet (40 mg total) by mouth daily. 12/16/21   Clapacs, Jackquline Denmark, MD  Probiotic Product (BACID) CAPS Take 1 tablet by mouth every 12 (twelve) hours.    [provider]  risperiDONE (RISPERDAL) 3 MG tablet Take 1.5 mg by mouth at bedtime. 11/22/22   [provider]  senna (SENOKOT) 8.6 MG TABS tablet Take 2 tablets by mouth in the morning.    [provider]  simvastatin (ZOCOR) 20 MG tablet Take 20 mg by mouth at bedtime. Patient not taking: Reported on 11/24/2022 05/18/22   [provider]  vitamin B-12 (CYANOCOBALAMIN) 1000 MCG tablet Take 1 tablet (1,000 mcg total) by mouth daily. 12/16/21   Clapacs, Jackquline Denmark, MD    Physical Exam: Vitals:   12/08/22 1800 12/08/22 1845 12/08/22 1900 12/08/22 1930  BP:  109/73 122/64 118/61  Pulse: (!) 112 (!) 105 87 (!) 102  Resp: (!) 22 18 16 19   Temp:      TempSrc:      SpO2: 96% 99% 100% 97%  Weight:      Height:       Physical Exam Vitals and nursing note reviewed.  Constitutional:      Comments: Lethargic but arousable.  Opens eyes to name call.  Oriented only to person.  HENT:     Head: Normocephalic and atraumatic.     Nose: Nose normal.     Mouth/Throat:     Mouth: Mucous membranes are moist.  Eyes:     Conjunctiva/sclera: Conjunctivae normal.  Cardiovascular:     Rate and Rhythm: Normal rate and regular rhythm.  Pulmonary:     Effort: Pulmonary effort is normal.     Breath sounds: Normal breath sounds.  Abdominal:     General: Bowel sounds are normal.     Palpations: Abdomen is soft.     Comments: Central adiposity  Genitourinary:    Comments: Chronic indwelling Foley catheter Musculoskeletal:     Cervical back: Normal range of motion and neck supple.     Comments: Heel protectors in place  Skin:    General: Skin is warm and dry.  Neurological:     Motor: Weakness  present.     Comments: Oriented only to person  Psychiatric:        Mood and Affect: Mood normal.        Behavior: Behavior normal.  Data Reviewed: Relevant notes from primary care and specialist visits, past discharge summaries as available in EHR, including Care Everywhere. Prior diagnostic testing as pertinent to current admission diagnoses Updated medications and problem lists for reconciliation ED course, including vitals, labs, imaging, treatment and response to treatment Triage notes, nursing and pharmacy notes and ED provider's notes Notable results as noted in HPI Labs reviewed.  Lipase 34, lactic acid 2.6 >> 2.0, sodium 137, potassium 3.5, chloride 108, bicarb 22, glucose 156, BUN 7, creatinine 0.67, calcium 8.0, total protein 5.3, albumin 2.1, AST 23, ALT 11, alkaline phosphatase 99, total bilirubin 0.5, PT 14.9, INR 1.2, white count 12.7, hemoglobin 8.6, hematocrit 29, platelet count 432 Twelve-lead EKG reviewed by me shows atrial fibrillation There are no new results to review at this time.  Assessment and Plan: * Sepsis (HCC) Evidenced by low-grade fever with a Tmax of 99.7, tachycardia, tachypnea and hypotension with systolic blood pressure in the 80s and responded to IV fluid resuscitation Patient also has leukocytosis and a downtrending lactic acid level Source of sepsis appears to be sacral osteomyelitis Patient is status post recent debridement of his sacral decubitus ulcer.  Gram stain yielded few gram-negative rods We will place patient on Rocephin 2 g IV daily Will consult ID for further recommendation.  Sacral osteomyelitis (HCC) Patient was hospitalized from 4/19 - 4/25 for management of an infected decubitus ulcer He is status postdebridement and had a bone biopsy which was consistent with acute osteomyelitis Will continue IV antibiotics Rocephin Consult ID for further recommendation  COPD, mild (HCC) Continue as needed bronchodilator therapy as well as  inhaled steroids.  Persistent atrial fibrillation (HCC) Continue Eliquis for secondary prophylaxis for an acute stroke  Paranoid schizophrenia (HCC) Continue risperidone and mirtazapine  Diabetes mellitus (HCC) Last hemoglobin A1c was 6 Maintain consistent carbohydrate diet Check blood sugars AC meals  CORONARY ATHEROSCLEROSIS, ARTERY BYPASS GRAFT Continue simvastatin      Advance Care Planning:   Code Status: Full Code   Consults: Will need ID consult in a.m. for recommendations for IV antibiotics for acute osteomyelitis  Family Communication: Called and left a voicemail for patient's legal guardian, Keene Breath.  Awaiting callback.  Severity of Illness: The appropriate patient status for this patient is INPATIENT. Inpatient status is judged to be reasonable and necessary in order to provide the required intensity of service to ensure the patient's safety. The patient's presenting symptoms, physical exam findings, and initial radiographic and laboratory data in the context of their chronic comorbidities is felt to place them at high risk for further clinical deterioration. Furthermore, it is not anticipated that the patient will be medically stable for discharge from the hospital within 2 midnights of admission.   * I certify that at the point of admission it is my clinical judgment that the patient will require inpatient hospital care spanning beyond 2 midnights from the point of admission due to high intensity of service, high risk for further deterioration and high frequency of surveillance required.*  Author: Lucile Shutters, MD 12/08/2022 8:47 PM  For on call review [email or web address].

## 2022-12-08 NOTE — ED Provider Notes (Signed)
[facility] Provider Note    Event Date/Time   First MD Initiated Contact with Patient 12/08/22 1633     (approximate)   History   Weakness   HPI  Evan [name] is a 67 y.o. male  here with AMS, weakness. Pt reports that over the past several days he has had worsening generalized weakness. Pt has a h/o  schizophrenia, HLD, CAD, NIDDM, AFib on Eliquis, and was just here for infected decub ulcer with wound vac in place. He states that over the past few days since being at his facility he has had weakness. He has had increased pain along his catheter site (foley). He has had poor appetite. Denies any increased pain other than his chronic buttocks pain which is not acutely worse since leaving the hospital. He has had a wound vac in place.      Physical Exam   Triage Vital Signs: ED Triage Vitals  Enc Vitals Group     BP      Pulse      Resp      Temp      Temp src      SpO2      Weight      Height      Head Circumference      Peak Flow      Pain Score      Pain Loc      Pain Edu?      Excl. in GC?     Most recent vital signs: Vitals:   12/09/22 0030 12/09/22 0140  BP: (!) 99/57 96/65  Pulse: 99 97  Resp:  18  Temp:    SpO2: 92% 98%     General: Awake, no distress.  CV:  Good peripheral perfusion. Tachycardic. Resp:  Normal work of breathing. Mild tachypnea noted. Lungs clear. Abd:  No distention. No tenderness. Other:  Wound vac over sacral wound appears c/d/I. Mild stage II changes around vac but no induration. Foley draining foul-smelling, dark urine. Foley bag open on arrival,.    ED Results / Procedures / Treatments   Labs (all labs ordered are listed, but only abnormal results are displayed) Labs Reviewed  LACTIC ACID, PLASMA - Abnormal; Notable for the following components:      Result Value   Lactic Acid, Venous 2.6 (*)    All other components within normal limits  LACTIC ACID, PLASMA - Abnormal; Notable for the  following components:   Lactic Acid, Venous 2.0 (*)    All other components within normal limits  COMPREHENSIVE METABOLIC PANEL - Abnormal; Notable for the following components:   Glucose, Bld 156 (*)    BUN 7 (*)    Calcium 8.0 (*)    Total Protein 5.3 (*)    Albumin 2.1 (*)    All other components within normal limits  CBC WITH DIFFERENTIAL/PLATELET - Abnormal; Notable for the following components:   WBC 12.7 (*)    RBC 3.17 (*)    Hemoglobin 8.6 (*)    HCT 29.0 (*)    MCHC 29.7 (*)    RDW 20.9 (*)    Platelets 432 (*)    Neutro Abs 9.6 (*)    Eosinophils Absolute 0.7 (*)    All other components within normal limits  URINALYSIS, W/ REFLEX TO CULTURE (INFECTION SUSPECTED) - Abnormal; Notable for the following components:   Color, Urine YELLOW (*)    APPearance CLEAR (*)    Glucose, UA >=500 (*)  Hgb urine dipstick MODERATE (*)    Protein, ur 30 (*)    All other components within normal limits  CBG MONITORING, ED - Abnormal; Notable for the following components:   Glucose-Capillary 107 (*)    All other components within normal limits  RESP PANEL BY RT-PCR (RSV, FLU A&B, COVID)  RVPGX2  CULTURE, BLOOD (ROUTINE X 2)  CULTURE, BLOOD (ROUTINE X 2)  PROTIME-INR  APTT  LIPASE, BLOOD  HIV ANTIBODY (ROUTINE TESTING W REFLEX)  PROTIME-INR  CORTISOL-AM, BLOOD  PROCALCITONIN  CBC  BASIC METABOLIC PANEL     EKG AFib, VR 109. QRS 124, QTc 458. Non specific IVCD. Borderline repo abnormality.   RADIOLOGY CT C/A/P: Decub ulcer with possible osteo, bilateral effusions, ? Acute pancreatitis, ? UTI   I also independently reviewed and agree with radiologist interpretations.   PROCEDURES:  Critical Care performed: Yes, see critical care procedure note(s)  .Critical Care  Performed by: Shaune Pollack, MD Authorized by: Shaune Pollack, MD   Critical care provider statement:    Critical care time (minutes):  30   Critical care time was exclusive of:  Separately billable  procedures and treating other patients   Critical care was necessary to treat or prevent imminent or life-threatening deterioration of the following conditions:  Cardiac failure, circulatory failure and respiratory failure   Critical care was time spent personally by me on the following activities:  Development of treatment plan with patient or surrogate, discussions with consultants, evaluation of patient's response to treatment, examination of patient, ordering and review of laboratory studies, ordering and review of radiographic studies, ordering and performing treatments and interventions, pulse oximetry, re-evaluation of patient's condition and review of old charts     MEDICATIONS ORDERED IN ED: Medications  acetaminophen (TYLENOL) tablet 500 mg (500 mg Oral Given 12/08/22 2133)  midodrine (PROAMATINE) tablet 5 mg (has no administration in time range)  nitroGLYCERIN (NITROSTAT) SL tablet 0.4 mg (has no administration in time range)  simvastatin (ZOCOR) tablet 20 mg (20 mg Oral Given 12/08/22 2139)  mirtazapine (REMERON) tablet 7.5 mg (7.5 mg Oral Given 12/08/22 2134)  risperiDONE (RISPERDAL) tablet 1.5 mg (1.5 mg Oral Given 12/08/22 2133)  pantoprazole (PROTONIX) EC tablet 40 mg (has no administration in time range)  acidophilus (RISAQUAD) capsule 1 capsule (1 capsule Oral Given 12/08/22 2134)  senna (SENOKOT) tablet 17.2 mg (has no administration in time range)  cyanocobalamin (VITAMIN B12) tablet 1,000 mcg (has no administration in time range)  melatonin tablet 5 mg (has no administration in time range)  cholecalciferol (VITAMIN D3) 25 MCG (1000 UNIT) tablet 5,000 Units (has no administration in time range)  feeding supplement (ENSURE ENLIVE / ENSURE PLUS) liquid 237 mL (has no administration in time range)  multivitamin with minerals tablet 1 tablet (has no administration in time range)  albuterol (PROVENTIL) (2.5 MG/3ML) 0.083% nebulizer solution 2.5 mg (has no administration in time range)   loratadine (CLARITIN) tablet 10 mg (has no administration in time range)  mometasone-formoterol (DULERA) 100-5 MCG/ACT inhaler 2 puff (has no administration in time range)  acetaminophen (TYLENOL) tablet 650 mg (has no administration in time range)    Or  acetaminophen (TYLENOL) suppository 650 mg (has no administration in time range)  ondansetron (ZOFRAN) tablet 4 mg (has no administration in time range)    Or  ondansetron (ZOFRAN) injection 4 mg (has no administration in time range)  cefTRIAXone (ROCEPHIN) 2 g in sodium chloride 0.9 % 100 mL IVPB (0 g Intravenous Stopped 12/08/22 2227)  apixaban (ELIQUIS) tablet 5 mg (5 mg Oral Given 12/08/22 2133)  gabapentin (NEURONTIN) capsule 200 mg (200 mg Oral Given 12/08/22 2134)  gabapentin (NEURONTIN) capsule 100 mg (has no administration in time range)  lactated ringers infusion (150 mL/hr Intravenous New Bag/Given 12/08/22 2357)  lactated ringers bolus 1,000 mL (0 mLs Intravenous Stopped 12/08/22 1936)  ceFEPIme (MAXIPIME) 2 g in sodium chloride 0.9 % 100 mL IVPB (0 g Intravenous Stopped 12/08/22 1757)  metroNIDAZOLE (FLAGYL) IVPB 500 mg (0 mg Intravenous Stopped 12/08/22 1936)  vancomycin (VANCOCIN) IVPB 1000 mg/200 mL premix (0 mg Intravenous Stopped 12/08/22 1936)  acetaminophen (TYLENOL) tablet 650 mg (650 mg Oral Given 12/08/22 1803)  sodium chloride 0.9 % bolus 1,000 mL (0 mLs Intravenous Stopped 12/08/22 1937)  iohexol (OMNIPAQUE) 300 MG/ML solution 100 mL (100 mLs Intravenous Contrast Given 12/08/22 1801)  sodium chloride 0.9 % bolus 500 mL (0 mLs Intravenous Stopped 12/08/22 2135)     IMPRESSION / MDM / ASSESSMENT AND PLAN / ED COURSE  I reviewed the triage vital signs and the nursing notes.                              Differential diagnosis includes, but is not limited to, sepsis (UTI, recurrent sacral infection, occult intra-abd infection), deconditioning, anemia, ACS, polypharmacy  Patient's presentation is most consistent with acute presentation  with potential threat to life or bodily function.  The patient is on the cardiac monitor to evaluate for evidence of arrhythmia and/or significant heart rate changes  67 yo M with h/o schizophrenia, HLD, CAD, NIDDM, AFib on Eliquis, here with generalized weakness, fatigue, fever. Pt febrile, hypotensive on arrival with elevated LA concerning for sepsis. IVF, Broad-spectrum ABX started. Suspect this is related to his decub wound. UA not c/w UTI. CBC with leukocytosis. CMP largely at baseline. Lipase normal.  Will continue empiric abx, admit to medicine.  FINAL CLINICAL IMPRESSION(S) / ED DIAGNOSES   Final diagnoses:  Sepsis without acute organ dysfunction, due to unspecified organism [facility])     Rx / DC Orders   ED Discharge Orders     None        Note:  This document was prepared using Dragon voice recognition software and may include unintentional dictation errors.   Shaune Pollack, MD 12/09/22 [phone]

## 2022-12-08 NOTE — Consult Note (Signed)
CODE SEPSIS - PHARMACY COMMUNICATION  **Broad Spectrum Antibiotics should be administered within 1 hour of Sepsis diagnosis**  Time Code Sepsis Called/Page Received: 1634  Antibiotics Ordered: Cefepime, Vancomycin  Time of 1st antibiotic administration: 1705  Additional action taken by pharmacy: N/A  If necessary, Name of Provider/Nurse Contacted: N/A   Will M. Dareen Piano, PharmD PGY-1 Pharmacy Resident 12/08/2022 4:41 PM

## 2022-12-08 NOTE — Assessment & Plan Note (Signed)
Continue simvastatin. 

## 2022-12-08 NOTE — Assessment & Plan Note (Addendum)
Evidenced by low-grade fever with a Tmax of 99.7, tachycardia, tachypnea and hypotension with systolic blood pressure in the 80s and responded to IV fluid resuscitation Patient also has leukocytosis and a downtrending lactic acid level Source of sepsis appears to be sacral osteomyelitis Patient is status post recent debridement of his sacral decubitus ulcer.  Gram stain yielded few gram-negative rods We will place patient on Rocephin 2 g IV daily Will consult ID for further recommendation.

## 2022-12-08 NOTE — ED Triage Notes (Signed)
Pt here via [facility] from [facility] with weakness. Staff at facility states that pt had low bp in the 80s. Pt has a sacral wound that is being cared for at the facility. Staff also states pt has a hbg of 8.1 and a WBC of 11.4.    100.4 120 20 RAC with of fluids given

## 2022-12-09 ENCOUNTER — Inpatient Hospital Stay: Payer: Medicare Other

## 2022-12-09 DIAGNOSIS — A419 Sepsis, unspecified organism: Secondary | ICD-10-CM | POA: Diagnosis not present

## 2022-12-09 LAB — CBC
HCT: 27.7 % — ABNORMAL LOW (ref 39.0–52.0)
Hemoglobin: 8.1 g/dL — ABNORMAL LOW (ref 13.0–17.0)
MCH: 27.5 pg (ref 26.0–34.0)
MCHC: 29.2 g/dL — ABNORMAL LOW (ref 30.0–36.0)
MCV: 93.9 fL (ref 80.0–100.0)
Platelets: 425 10*3/uL — ABNORMAL HIGH (ref 150–400)
RBC: 2.95 MIL/uL — ABNORMAL LOW (ref 4.22–5.81)
RDW: 21.3 % — ABNORMAL HIGH (ref 11.5–15.5)
WBC: 14.6 10*3/uL — ABNORMAL HIGH (ref 4.0–10.5)
nRBC: 0 % (ref 0.0–0.2)

## 2022-12-09 LAB — LACTIC ACID, PLASMA: Lactic Acid, Venous: 1.6 mmol/L (ref 0.5–1.9)

## 2022-12-09 LAB — BASIC METABOLIC PANEL
Anion gap: 7 (ref 5–15)
BUN: 5 mg/dL — ABNORMAL LOW (ref 8–23)
CO2: 21 mmol/L — ABNORMAL LOW (ref 22–32)
Calcium: 7.5 mg/dL — ABNORMAL LOW (ref 8.9–10.3)
Chloride: 110 mmol/L (ref 98–111)
Creatinine, Ser: 0.57 mg/dL — ABNORMAL LOW (ref 0.61–1.24)
GFR, Estimated: >60 mL/min (ref >60)
Glucose, Bld: 106 mg/dL — ABNORMAL HIGH (ref 70–99)
Potassium: 3.3 mmol/L — ABNORMAL LOW (ref 3.5–5.1)
Sodium: 138 mmol/L (ref 135–145)

## 2022-12-09 LAB — HIV ANTIBODY (ROUTINE TESTING W REFLEX): HIV Screen 4th Generation wRfx: NONREACTIVE

## 2022-12-09 LAB — GLUCOSE, CAPILLARY
Glucose-Capillary: 100 mg/dL — ABNORMAL HIGH (ref 70–99)
Glucose-Capillary: 93 mg/dL (ref 70–99)

## 2022-12-09 LAB — CULTURE, BLOOD (ROUTINE X 2)

## 2022-12-09 LAB — PROTIME-INR
INR: 1.6 — ABNORMAL HIGH (ref 0.8–1.2)
Prothrombin Time: 18.6 seconds — ABNORMAL HIGH (ref 11.4–15.2)

## 2022-12-09 LAB — CBG MONITORING, ED
Glucose-Capillary: 107 mg/dL — ABNORMAL HIGH (ref 70–99)
Glucose-Capillary: 209 mg/dL — ABNORMAL HIGH (ref 70–99)
Glucose-Capillary: 101 mg/dL — ABNORMAL HIGH (ref 70–99)
Glucose-Capillary: 99 mg/dL (ref 70–99)

## 2022-12-09 LAB — CORTISOL-AM, BLOOD: Cortisol - AM: 18.5 ug/dL (ref 6.7–22.6)

## 2022-12-09 LAB — PROCALCITONIN: Procalcitonin: <0.1 ng/mL

## 2022-12-09 MED ORDER — VANCOMYCIN HCL 1250 MG/250ML IV SOLN
1250.0000 mg | Freq: Once | INTRAVENOUS | Status: AC
  Administered 2022-12-09: 1250 mg via INTRAVENOUS
  Filled 2022-12-09: qty 250

## 2022-12-09 MED ORDER — MIDODRINE HCL 5 MG PO TABS
10.0000 mg | ORAL_TABLET | Freq: Three times a day (TID) | ORAL | Status: DC
  Administered 2022-12-09 – 2022-12-13 (×12): 10 mg via ORAL
  Filled 2022-12-09 (×12): qty 2

## 2022-12-09 MED ORDER — VANCOMYCIN HCL 750 MG/150ML IV SOLN
750.0000 mg | Freq: Two times a day (BID) | INTRAVENOUS | Status: DC
  Administered 2022-12-09 – 2022-12-11 (×4): 750 mg via INTRAVENOUS
  Filled 2022-12-09 (×4): qty 150

## 2022-12-09 MED ORDER — LACTATED RINGERS IV SOLN: INTRAVENOUS | Status: DC

## 2022-12-09 NOTE — ED Notes (Signed)
MD Georgeann Oppenheim states that he will address pt's right arm, and for all peripheal IV's be removed from that arm. Pt'a Iv removed by this RN.

## 2022-12-09 NOTE — Consult Note (Signed)
Pharmacy Antibiotic Note  Evan [name] is a 67 y.o. male with medical history including paranoid schizophrenia, CAD, DM c/b diabetic retinopathy, HTN, obesity, recent hospitalization for infected sacral decubitus ulcer admitted on 12/08/2022 with sepsis secondary to sacral osteomyelitis.  Pharmacy has been consulted for vancomycin dosing. Patient is also ordered ceftriaxone.  Plan:  Patient received vancomycin 1 g IV 5/3 at 1803  Will complete loading dose with vancomycin 1.25 g IV x 1 this AM followed by maintenance regimen of vancomycin 750 mg IV q12h --Calculated AUC: 444, Cmin 12.8 --Daily Scr per protocol --Levels at steady state or as clinically indicated  Height: 5\' 5"  (165.1 cm) Weight: 68.8 kg (151 lb 10.8 oz) IBW/kg (Calculated) : 61.5  Temp (24hrs), Avg:99.9 F (37.7 C), Min:99.7 F (37.6 C), Max:100 F (37.8 C)  Recent Labs  Lab 12/08/22 1648 12/08/22 1834 12/09/22 0523 12/09/22 0630  WBC 12.7*  --   --  14.6*  CREATININE 0.67  --  0.57*  --   LATICACIDVEN 2.6* 2.0*  --   --     Estimated Creatinine Clearance: 76.9 mL/min (A) (by C-G formula based on SCr of 0.57 mg/dL (L)).    Allergies  Allergen Reactions   Penicillin G Hives   Sulfa Antibiotics Hives   Tiotropium    Zoloft [Sertraline Hcl] Other (See Comments)    Antimicrobials this admission: Cefepime 5/3 x 1 Metronidazole 5/3 x 1 Ceftriaxone 5/3 >>  Vancomycin 5/3 >>   Dose adjustments this admission: N/A  Microbiology results: 5/3 BCx: NGTD  Thank you for allowing pharmacy to be a part of this patient's care.  Tressie Ellis 12/09/2022 9:33 AM

## 2022-12-09 NOTE — ED Notes (Signed)
Pt's right arm appears to be weeping. Pt bedding changed, and pads replaced. Message sent to [facility] MD, to update him via secure chat.

## 2022-12-09 NOTE — Progress Notes (Signed)
PROGRESS NOTE    Evan [name]  VWU:[id number] DOB: 06-Jun-1956 DOA: 12/08/2022 PCP: Evan Florida, Evan [name]    Brief Narrative:  67 y.o. male with medical history significant for paranoid schizophrenia, coronary artery disease, non-insulin-dependent diabetes mellitus with complications of diabetic retinopathy, hypertension, obesity who was recently discharged from the hospital following treatment for an infected sacral decubitus ulcer.  He was discharged to the skilled nursing facility with a wound VAC and on antibiotic therapy to treat sacral osteomyelitis. Patient was sent to the emergency room from the skilled nursing facility for evaluation of weakness and hypotension.  He was noted to have systolic blood pressure in the 80s.  He had a Tmax of 100.4 and was tachycardic with heart rate of 120.  CT scan with decubitus ulcer level of sacrum  Assessment & Plan:   Principal Problem:   Sepsis (HCC) Active Problems:   Sacral osteomyelitis (HCC)   COPD, mild (HCC)   Persistent atrial fibrillation (HCC)   CORONARY ATHEROSCLEROSIS, ARTERY BYPASS GRAFT   Diabetes mellitus (HCC)   Paranoid schizophrenia (HCC)  * Sepsis (HCC) Evidenced by low-grade fever with a Tmax of 99.7, tachycardia, tachypnea and hypotension with systolic blood pressure in the 80s and responded to IV fluid resuscitation Patient also has leukocytosis and a downtrending lactic acid level Source of sepsis appears to be sacral osteomyelitis Patient is status post recent debridement of his sacral decubitus ulcer.  Gram stain yielded few gram-negative rods Plan: Continue Rocephin 2 g IV daily And vancomycin with pharmacy dosing assistance Monitor vitals and fever curve IV fluids ID consult on Monday 5/6   Sacral osteomyelitis (HCC) Patient was hospitalized from 4/19 - 4/25 for management of an infected decubitus ulcer He is status postdebridement and had a bone biopsy which was consistent with acute  osteomyelitis Plan: Continue IV antibiotics as above Will involve surgery for evaluation Monday 5/6   COPD, mild (HCC) Continue as needed bronchodilator therapy as well as inhaled steroids.   Persistent atrial fibrillation (HCC) Continue Eliquis for secondary prophylaxis for an acute stroke   Paranoid schizophrenia (HCC) Continue risperidone and mirtazapine   Diabetes mellitus (HCC) Last hemoglobin A1c was 6 Maintain consistent carbohydrate diet Check blood sugars AC meals   CORONARY ATHEROSCLEROSIS, ARTERY BYPASS GRAFT Continue simvastatin   DVT prophylaxis: Apixaban Code Status: Full Family Communication: None Disposition Plan: Status is: Inpatient Remains inpatient appropriate because: Sepsis secondary to sacral osteomyelitis   Level of care: Progressive  Consultants:  None  Procedures:  None  Antimicrobials: Vancomycin Ceftriaxone   Subjective: Seen and salmon.  Very lethargic.  Unable to provide history.  Objective: Vitals:   12/09/22 0730 12/09/22 0814 12/09/22 1155 12/09/22 1358  BP: 90/68  94/61 100/63  Pulse: 96  (!) 111   Resp: (!) 23  (!) 22   Temp:  100 F (37.8 C) 98.3 F (36.8 C)   TempSrc:   Oral   SpO2: 94%  92%   Weight:      Height:        Intake/Output Summary (Last 24 hours) at 12/09/2022 1423 Last data filed at 12/09/2022 0707 Gross per 24 hour  Intake 3695 ml  Output 3000 ml  Net 695 ml   Filed Weights   12/08/22 1636  Weight: 68.8 kg    Examination:  General exam: Lethargic.  Ill-appearing Respiratory system: Poor respiratory effort.  Lungs overall clear.  Normal work of breathing.  Room air Cardiovascular system: S1-S2, RRR, no murmurs Gastrointestinal system: Obese, NT/ND, normal  bowel sounds GU: Chronic indwelling Foley Central nervous system: Lethargic.  Oriented to person Extremities: Bilateral lower extremity weakness.  He will protectors in place Skin: No rashes, lesions or ulcers Psychiatry: Judgement and  insight appear normal. Mood & affect appropriate.     Data Reviewed: I have personally reviewed following labs and imaging studies  CBC: Recent Labs  Lab 12/08/22 1648 12/09/22 0630  WBC 12.7* 14.6*  NEUTROABS 9.6*  --   HGB 8.6* 8.1*  HCT 29.0* 27.7*  MCV 91.5 93.9  PLT 432* 425*   Basic Metabolic Panel: Recent Labs  Lab 12/08/22 1648 12/09/22 0523  NA 137 138  K 3.5 3.3*  CL 108 110  CO2 22 21*  GLUCOSE 156* 106*  BUN 7* 5*  CREATININE 0.67 0.57*  CALCIUM 8.0* 7.5*   GFR: Estimated Creatinine Clearance: 76.9 mL/min (A) (by C-G formula based on SCr of 0.57 mg/dL (L)). Liver Function Tests: Recent Labs  Lab 12/08/22 1648  AST 23  ALT 11  ALKPHOS 99  BILITOT 0.5  PROT 5.3*  ALBUMIN 2.1*   Recent Labs  Lab 12/08/22 1649  LIPASE 34   No results for input(s): "AMMONIA" in the last 168 hours. Coagulation Profile: Recent Labs  Lab 12/08/22 1648 12/09/22 0523  INR 1.2 1.6*   Cardiac Enzymes: No results for input(s): "CKTOTAL", "CKMB", "CKMBINDEX", "TROPONINI" in the last 168 hours. BNP (last 3 results) No results for input(s): "PROBNP" in the last 8760 hours. HbA1C: No results for input(s): "HGBA1C" in the last 72 hours. CBG: Recent Labs  Lab 12/08/22 2131 12/09/22 0435 12/09/22 0522 12/09/22 0849  GLUCAP 107* 209* 107* 101*   Lipid Profile: No results for input(s): "CHOL", "HDL", "LDLCALC", "TRIG", "CHOLHDL", "LDLDIRECT" in the last 72 hours. Thyroid Function Tests: No results for input(s): "TSH", "T4TOTAL", "FREET4", "T3FREE", "THYROIDAB" in the last 72 hours. Anemia Panel: No results for input(s): "VITAMINB12", "FOLATE", "FERRITIN", "TIBC", "IRON", "RETICCTPCT" in the last 72 hours. Sepsis Labs: Recent Labs  Lab 12/08/22 1648 12/08/22 1834 12/09/22 0523  PROCALCITON  --   --  <0.10  LATICACIDVEN 2.6* 2.0*  --     Recent Results (from the past 240 hour(s))  Resp panel by RT-PCR (RSV, Flu A&B, Covid) Anterior Nasal Swab     Status:  None   Collection Time: 12/08/22  4:48 PM   Specimen: Anterior Nasal Swab  Result Value Ref Range Status   SARS Coronavirus 2 by RT PCR NEGATIVE NEGATIVE Final    Comment: (NOTE) SARS-CoV-2 target nucleic acids are NOT DETECTED.  The SARS-CoV-2 RNA is generally detectable in upper respiratory specimens during the acute phase of infection. The lowest concentration of SARS-CoV-2 viral copies this assay can detect is 138 copies/mL. A negative result does not preclude SARS-Cov-2 infection and should not be used as the sole basis for treatment or other patient management decisions. A negative result may occur with  improper specimen collection/handling, submission of specimen other than nasopharyngeal swab, presence of viral mutation(s) within the areas targeted by this assay, and inadequate number of viral copies(<138 copies/mL). A negative result must be combined with clinical observations, patient history, and epidemiological information. The expected result is Negative.  Fact Sheet for Patients:  BloggerCourse.com  Fact Sheet for Healthcare Providers:  SeriousBroker.it  This test is no t yet approved or cleared by the [place] FDA and  has been authorized for detection and/or diagnosis of SARS-CoV-2 by FDA under an Emergency Use Authorization (EUA). This EUA will remain  in effect (  meaning this test can be used) for the duration of the COVID-19 declaration under Section 564(b)(1) of the Act, 21 U.S.C.section 360bbb-3(b)(1), unless the authorization is terminated  or revoked sooner.       Influenza A by PCR NEGATIVE NEGATIVE Final   Influenza B by PCR NEGATIVE NEGATIVE Final    Comment: (NOTE) The Xpert Xpress SARS-CoV-2/FLU/RSV plus assay is intended as an aid in the diagnosis of influenza from Nasopharyngeal swab specimens and should not be used as a sole basis for treatment. Nasal washings and aspirates are unacceptable  for Xpert Xpress SARS-CoV-2/FLU/RSV testing.  Fact Sheet for Patients: BloggerCourse.com  Fact Sheet for Healthcare Providers: SeriousBroker.it  This test is not yet approved or cleared by the [place] FDA and has been authorized for detection and/or diagnosis of SARS-CoV-2 by FDA under an Emergency Use Authorization (EUA). This EUA will remain in effect (meaning this test can be used) for the duration of the COVID-19 declaration under Section 564(b)(1) of the Act, 21 U.S.C. section 360bbb-3(b)(1), unless the authorization is terminated or revoked.     Resp Syncytial Virus by PCR NEGATIVE NEGATIVE Final    Comment: (NOTE) Fact Sheet for Patients: BloggerCourse.com  Fact Sheet for Healthcare Providers: SeriousBroker.it  This test is not yet approved or cleared by the [place] FDA and has been authorized for detection and/or diagnosis of SARS-CoV-2 by FDA under an Emergency Use Authorization (EUA). This EUA will remain in effect (meaning this test can be used) for the duration of the COVID-19 declaration under Section 564(b)(1) of the Act, 21 U.S.C. section 360bbb-3(b)(1), unless the authorization is terminated or revoked.  Performed at [facility], [address], [place], [place] [address]   Blood Culture (routine x 2)     Status: None (Preliminary result)   Collection Time: 12/08/22  4:48 PM   Specimen: BLOOD  Result Value Ref Range Status   Specimen Description BLOOD LEFT ANTECUBITAL  Final   Special Requests   Final    BOTTLES DRAWN AEROBIC AND ANAEROBIC Blood Culture results may not be optimal due to an excessive volume of blood received in culture bottles   Culture   Final    NO GROWTH < 12 HOURS Performed at [facility], [address] [place], [place] [address]    Report Status PENDING  Incomplete  Blood Culture (routine x 2)      Status: None (Preliminary result)   Collection Time: 12/08/22  5:01 PM   Specimen: BLOOD RIGHT HAND  Result Value Ref Range Status   Specimen Description BLOOD RIGHT HAND  Final   Special Requests   Final    BOTTLES DRAWN AEROBIC AND ANAEROBIC Blood Culture adequate volume   Culture   Final    NO GROWTH < 12 HOURS Performed at [facility], [address], [place], [place] [address]    Report Status PENDING  Incomplete         Radiology Studies: CT CHEST ABDOMEN PELVIS W CONTRAST  Result Date: 12/08/2022 CLINICAL DATA:  Sepsis EXAM: CT CHEST, ABDOMEN, AND PELVIS WITH CONTRAST TECHNIQUE: Multidetector CT imaging of the chest, abdomen and pelvis was performed following the standard protocol during bolus administration of intravenous contrast. RADIATION DOSE REDUCTION: This exam was performed according to the departmental dose-optimization program which includes automated exposure control, adjustment of the mA and/or kV according to patient size and/or use of iterative reconstruction technique. CONTRAST:  OMNIPAQUE IOHEXOL 300 MG/ML  SOLN COMPARISON:  Chest x-ray 12/08/2022,  CT angio chest 08/27/2014 FINDINGS: CT CHEST FINDINGS Cardiovascular: Prominent sized heart size. No significant pericardial effusion. The thoracic aorta is normal in caliber. Severe atherosclerotic plaque of the thoracic aorta including aortic valve leaflet calcifications. Four-vessel coronary artery calcifications status post coronary artery bypass graft. Mediastinum/Nodes: No enlarged mediastinal, hilar, or axillary lymph nodes. Thyroid gland, trachea, and esophagus demonstrate no significant findings. Lungs/Pleura: No focal consolidation. No pulmonary nodule. No pulmonary mass. Trace to small right and trace left pleural effusions. No pneumothorax. Musculoskeletal: No chest wall abnormality. No suspicious lytic or blastic osseous lesions. No acute displaced fracture. Similar-appearing vertebral body  hemangioma of the T9 vertebral level. Multilevel degenerative changes of the spine. CT ABDOMEN PELVIS FINDINGS Hepatobiliary: No focal liver abnormality. Calcified gallstone noted within the gallbladder lumen. No gallbladder wall thickening or pericholecystic fluid. No biliary dilatation. Pancreas: Slight hazy contour of the uncinate process of the pancreas. No focal lesion. No main pancreatic ductal dilatation. Spleen: Normal in size without focal abnormality. Adrenals/Urinary Tract: No adrenal nodule bilaterally. Right nephrolithiasis measuring up to 3 mm. No left nephrolithiasis. No ureterolithiasis bilaterally. No hydroureteronephrosis. Urinary bladder is decompressed with circumferential urinary bladder wall thickening and mild perivesicular fat stranding. Foley catheter tip and balloon terminate within the urinary bladder lumen. Stomach/Bowel: Stomach is within normal limits. No evidence of bowel wall thickening or dilatation. Stool throughout the colon. Appendix appears normal. Vascular/Lymphatic: No abdominal aorta or iliac aneurysm. Severe atherosclerotic plaque of the aorta and its branches. No abdominal, pelvic, or inguinal lymphadenopathy. Reproductive: Prostate is unremarkable. Other: Trace fat stranding and free fluid noted along the uncinate process of the pancreas as well as third portion of the duodenum. Trace free fluid courses into the pelvis and lower abdomen. No intraperitoneal free gas. No organized fluid collection. Musculoskeletal: Small fat containing umbilical hernia. Diffuse subcutaneus soft tissue edema. Decubitus ulcer at the level of the sacrum. No organized fluid collection. Associated cortical erosion of the distal most S5 level and coccyx. No suspicious lytic or blastic osseous lesions. No acute displaced fracture. Multilevel degenerative changes of the spine. IMPRESSION: 1. Decubitus ulcer at the level of the sacrum. Associated cortical erosion of the distal most S5 level and coccyx  suggestive of osteomyelitis. Recommend correlation with MRI (with intravenous contrast if GFR greater than 30). 2. Trace to small right and trace left pleural effusions. 3. Question acute pancreatitis-correlate with lipase levels. Likely reactive changes of the duodenum. 4. Cholelithiasis no CT finding of acute cholecystitis. If clinically indicated, consider right upper quadrant ultrasound for further evaluation. 5. Decompressed urinary bladder with associated wall thickening and trace perivesicular fat stranding. Consider correlation with urinalysis to exclude infection in the setting of a Foley catheter in appropriate position. 6. Diffuse subcutaneus soft tissue edema along the abdomen and pelvis. 7. Aortic Atherosclerosis (ICD10-I70.0) including four-vessel coronary artery and aortic valve leaflet calcifications-correlate for aortic stenosis. Status post coronary artery bypass graft. Electronically Signed   By: Tish Frederickson M.D.   On: 12/08/2022 18:17   DG Chest Port 1 View  Result Date: 12/08/2022 CLINICAL DATA:  Questionable sepsis EXAM: PORTABLE CHEST 1 VIEW COMPARISON:  Chest x-ray 11/24/2022 FINDINGS: The heart is enlarged. There are atelectatic changes in the right lower lung. There central pulmonary vascular congestion. There is no pleural effusion or pneumothorax. No acute fractures are seen. IMPRESSION: 1. Cardiomegaly with central pulmonary vascular congestion. 2. Atelectatic changes in the right lower lung. Electronically Signed   By: Darliss Cheney M.D.   On: 12/08/2022 17:14  Scheduled Meds:  acidophilus  1 capsule Oral Q12H   apixaban  5 mg Oral BID   cholecalciferol  5,000 Units Oral Daily   cyanocobalamin  1,000 mcg Oral Daily   feeding supplement  237 mL Oral BID BM   gabapentin  100 mg Oral Daily   gabapentin  200 mg Oral QHS   loratadine  10 mg Oral Daily   midodrine  10 mg Oral TID WC   mirtazapine  7.5 mg Oral QHS   mometasone-formoterol  2 puff Inhalation BID    multivitamin with minerals  1 tablet Oral Daily   pantoprazole  40 mg Oral Daily   risperiDONE  1.5 mg Oral QHS   senna  2 tablet Oral q AM   simvastatin  20 mg Oral QHS   Continuous Infusions:  cefTRIAXone (ROCEPHIN)  IV Stopped (12/08/22 2227)   lactated ringers 100 mL/hr at 12/09/22 0843   vancomycin       LOS: 1 day     Tresa Moore, MD [facility]   If 7PM-7AM, please contact night-coverage  12/09/2022, 2:23 PM

## 2022-12-09 NOTE — ED Notes (Signed)
Pt given lunch tray, states that he has no appetite

## 2022-12-09 NOTE — ED Notes (Signed)
Hospitalist (Tochukwu) messaged due to pt having EKG changes, EKG uploaded. This RN did not see any note of pt having A fib in history, and EKG showed as A fib. Per hospitalist pt has a hx of A fib, no new orders.

## 2022-12-09 NOTE — Progress Notes (Signed)
Patient had wound vac placed on sacrum, wound vac not conned to suction, wrapped up and kept inside the gloves, MD made aware of, wound care consult ordered, wet to dry dressing done.

## 2022-12-10 DIAGNOSIS — A419 Sepsis, unspecified organism: Secondary | ICD-10-CM | POA: Diagnosis not present

## 2022-12-10 LAB — CREATININE, SERUM
Creatinine, Ser: 0.56 mg/dL — ABNORMAL LOW (ref 0.61–1.24)
GFR, Estimated: >60 mL/min (ref >60)

## 2022-12-10 LAB — CBC WITH DIFFERENTIAL/PLATELET
Abs Immature Granulocytes: 0.02 10*3/uL (ref 0.00–0.07)
Basophils Absolute: 0 10*3/uL (ref 0.0–0.1)
Basophils Relative: 0 %
Eosinophils Absolute: 1 10*3/uL — ABNORMAL HIGH (ref 0.0–0.5)
Eosinophils Relative: 11 %
HCT: 25.9 % — ABNORMAL LOW (ref 39.0–52.0)
Hemoglobin: 7.7 g/dL — ABNORMAL LOW (ref 13.0–17.0)
Immature Granulocytes: 0 %
Lymphocytes Relative: 15 %
Lymphs Abs: 1.3 10*3/uL (ref 0.7–4.0)
MCH: 27.3 pg (ref 26.0–34.0)
MCHC: 29.7 g/dL — ABNORMAL LOW (ref 30.0–36.0)
MCV: 91.8 fL (ref 80.0–100.0)
Monocytes Absolute: 0.7 10*3/uL (ref 0.1–1.0)
Monocytes Relative: 8 %
Neutro Abs: 5.9 10*3/uL (ref 1.7–7.7)
Neutrophils Relative %: 66 %
Platelets: 398 10*3/uL (ref 150–400)
RBC: 2.82 MIL/uL — ABNORMAL LOW (ref 4.22–5.81)
RDW: 21.2 % — ABNORMAL HIGH (ref 11.5–15.5)
Smear Review: NORMAL
WBC: 8.9 10*3/uL (ref 4.0–10.5)
nRBC: 0 % (ref 0.0–0.2)

## 2022-12-10 LAB — BASIC METABOLIC PANEL
Anion gap: 4 — ABNORMAL LOW (ref 5–15)
BUN: 7 mg/dL — ABNORMAL LOW (ref 8–23)
CO2: 24 mmol/L (ref 22–32)
Calcium: 7.6 mg/dL — ABNORMAL LOW (ref 8.9–10.3)
Chloride: 110 mmol/L (ref 98–111)
Creatinine, Ser: 0.55 mg/dL — ABNORMAL LOW (ref 0.61–1.24)
GFR, Estimated: >60 mL/min (ref >60)
Glucose, Bld: 91 mg/dL (ref 70–99)
Potassium: 3.3 mmol/L — ABNORMAL LOW (ref 3.5–5.1)
Sodium: 138 mmol/L (ref 135–145)

## 2022-12-10 LAB — GLUCOSE, CAPILLARY
Glucose-Capillary: 91 mg/dL (ref 70–99)
Glucose-Capillary: 134 mg/dL — ABNORMAL HIGH (ref 70–99)
Glucose-Capillary: 107 mg/dL — ABNORMAL HIGH (ref 70–99)
Glucose-Capillary: 91 mg/dL (ref 70–99)

## 2022-12-10 MED ORDER — CHLORHEXIDINE GLUCONATE CLOTH 2 % EX PADS
6.0000 | MEDICATED_PAD | Freq: Every day | CUTANEOUS | Status: DC
  Administered 2022-12-10 – 2022-12-13 (×4): 6 via TOPICAL

## 2022-12-10 NOTE — Progress Notes (Signed)
PROGRESS NOTE    Evan [name]  UJW:[id number] DOB: 1955-11-14 DOA: 12/08/2022 PCP: Melonie Florida, FNP    Brief Narrative:  67 y.o. male with medical history significant for paranoid schizophrenia, coronary artery disease, non-insulin-dependent diabetes mellitus with complications of diabetic retinopathy, hypertension, obesity who was recently discharged from the hospital following treatment for an infected sacral decubitus ulcer.  He was discharged to the skilled nursing facility with a wound VAC and on antibiotic therapy to treat sacral osteomyelitis. Patient was sent to the emergency room from the skilled nursing facility for evaluation of weakness and hypotension.  He was noted to have systolic blood pressure in the 80s.  He had a Tmax of 100.4 and was tachycardic with heart rate of 120.  CT scan with decubitus ulcer level of sacrum  Assessment & Plan:   Principal Problem:   Sepsis (HCC) Active Problems:   Sacral osteomyelitis (HCC)   COPD, mild (HCC)   Persistent atrial fibrillation (HCC)   CORONARY ATHEROSCLEROSIS, ARTERY BYPASS GRAFT   Diabetes mellitus (HCC)   Paranoid schizophrenia (HCC)  * Sepsis (HCC) Evidenced by low-grade fever with a Tmax of 99.7, tachycardia, tachypnea and hypotension with systolic blood pressure in the 80s and responded to IV fluid resuscitation Patient also has leukocytosis and a downtrending lactic acid level Source of sepsis appears to be sacral osteomyelitis Patient is status post recent debridement of his sacral decubitus ulcer.  Gram stain yielded few gram-negative rods Plan: Continue Rocephin 2 g IV daily Continue vancomycin with pharmacy dosing assistance Monitor vitals and fever curve IV fluids ID consult on Monday 5/6   Sacral osteomyelitis [facility]) Patient was hospitalized from 4/19 - 4/25 for management of an infected decubitus ulcer He is status postdebridement and had a bone biopsy which was consistent with acute  osteomyelitis Plan: Continue IV antibiotics as above As needed pain control Will involve surgery for evaluation Monday 5/6   COPD, mild (HCC) Continue as needed bronchodilator therapy as well as inhaled steroids.   Persistent atrial fibrillation (HCC) Continue Eliquis for secondary prophylaxis for an acute stroke   Paranoid schizophrenia (HCC) Continue risperidone and mirtazapine   Diabetes mellitus (HCC) Last hemoglobin A1c was 6 Maintain consistent carbohydrate diet Check blood sugars AC meals   CORONARY ATHEROSCLEROSIS, ARTERY BYPASS GRAFT Continue simvastatin   DVT prophylaxis: Apixaban Code Status: Full Family Communication: None Disposition Plan: Status is: Inpatient Remains inpatient appropriate because: Sepsis secondary to sacral osteomyelitis   Level of care: Progressive  Consultants:  None  Procedures:  None  Antimicrobials: Vancomycin Ceftriaxone   Subjective: Seen and examined.  Remains lethargic.  Unable to provide much history.  Objective: Vitals:   12/09/22 2300 12/10/22 0346 12/10/22 0752 12/10/22 1211  BP: 113/68 94/64 114/61 105/71  Pulse: 90 91 93 100  Resp: 17 16 18 20   Temp: 98.4 F (36.9 C) 98.9 F (37.2 C) 99.3 F (37.4 C) 98.4 F (36.9 C)  TempSrc: Oral Axillary    SpO2: 96% 95% 99% 97%  Weight:      Height:        Intake/Output Summary (Last 24 hours) at 12/10/2022 1340 Last data filed at 12/10/2022 1035 Gross per 24 hour  Intake 1118.15 ml  Output 1150 ml  Net -31.85 ml   Filed Weights   12/08/22 1636  Weight: 68.8 kg    Examination:  General exam: Lethargic.  Ill-appearing Respiratory system: Poor respiratory effort.  Lungs overall clear.  Normal work of breathing.  Room air Cardiovascular system: S1-S2,  RRR, no murmurs Gastrointestinal system: Obese, NT/ND, normal bowel sounds GU: Chronic indwelling Foley Central nervous system: Lethargic.  Oriented to person Extremities: Bilateral lower extremity weakness.   He will protectors in place Skin: Sacral decubitus ulcer Psychiatry: Judgement and insight appear normal. Mood & affect appropriate.     Data Reviewed: I have personally reviewed following labs and imaging studies  CBC: Recent Labs  Lab 12/08/22 1648 12/09/22 0630 12/10/22 0809  WBC 12.7* 14.6* 8.9  NEUTROABS 9.6*  --  5.9  HGB 8.6* 8.1* 7.7*  HCT 29.0* 27.7* 25.9*  MCV 91.5 93.9 91.8  PLT 432* 425* 398   Basic Metabolic Panel: Recent Labs  Lab 12/08/22 1648 12/09/22 0523 12/10/22 0357 12/10/22 0809  NA 137 138  --  138  K 3.5 3.3*  --  3.3*  CL 108 110  --  110  CO2 22 21*  --  24  GLUCOSE 156* 106*  --  91  BUN 7* 5*  --  7*  CREATININE 0.67 0.57* 0.56* 0.55*  CALCIUM 8.0* 7.5*  --  7.6*   GFR: Estimated Creatinine Clearance: 76.9 mL/min (A) (by C-G formula based on SCr of 0.55 mg/dL (L)). Liver Function Tests: Recent Labs  Lab 12/08/22 1648  AST 23  ALT 11  ALKPHOS 99  BILITOT 0.5  PROT 5.3*  ALBUMIN 2.1*   Recent Labs  Lab 12/08/22 1649  LIPASE 34   No results for input(s): "AMMONIA" in the last 168 hours. Coagulation Profile: Recent Labs  Lab 12/08/22 1648 12/09/22 0523  INR 1.2 1.6*   Cardiac Enzymes: No results for input(s): "CKTOTAL", "CKMB", "CKMBINDEX", "TROPONINI" in the last 168 hours. BNP (last 3 results) No results for input(s): "PROBNP" in the last 8760 hours. HbA1C: No results for input(s): "HGBA1C" in the last 72 hours. CBG: Recent Labs  Lab 12/09/22 1533 12/09/22 1741 12/09/22 2104 12/10/22 0748 12/10/22 1207  GLUCAP 99 100* 93 91 134*   Lipid Profile: No results for input(s): "CHOL", "HDL", "LDLCALC", "TRIG", "CHOLHDL", "LDLDIRECT" in the last 72 hours. Thyroid Function Tests: No results for input(s): "TSH", "T4TOTAL", "FREET4", "T3FREE", "THYROIDAB" in the last 72 hours. Anemia Panel: No results for input(s): "VITAMINB12", "FOLATE", "FERRITIN", "TIBC", "IRON", "RETICCTPCT" in the last 72 hours. Sepsis  Labs: Recent Labs  Lab 12/08/22 1648 12/08/22 1834 12/09/22 0523 12/09/22 1919  PROCALCITON  --   --  <0.10  --   LATICACIDVEN 2.6* 2.0*  --  1.6    Recent Results (from the past 240 hour(s))  Resp panel by RT-PCR (RSV, Flu A&B, Covid) Anterior Nasal Swab     Status: None   Collection Time: 12/08/22  4:48 PM   Specimen: Anterior Nasal Swab  Result Value Ref Range Status   SARS Coronavirus 2 by RT PCR NEGATIVE NEGATIVE Final    Comment: (NOTE) SARS-CoV-2 target nucleic acids are NOT DETECTED.  The SARS-CoV-2 RNA is generally detectable in upper respiratory specimens during the acute phase of infection. The lowest concentration of SARS-CoV-2 viral copies this assay can detect is 138 copies/mL. A negative result does not preclude SARS-Cov-2 infection and should not be used as the sole basis for treatment or other patient management decisions. A negative result may occur with  improper specimen collection/handling, submission of specimen other than nasopharyngeal swab, presence of viral mutation(s) within the areas targeted by this assay, and inadequate number of viral copies(<138 copies/mL). A negative result must be combined with clinical observations, patient history, and epidemiological information. The expected result is  Negative.  Fact Sheet for Patients:  BloggerCourse.com  Fact Sheet for Healthcare Providers:  SeriousBroker.it  This test is no t yet approved or cleared by the [place] FDA and  has been authorized for detection and/or diagnosis of SARS-CoV-2 by FDA under an Emergency Use Authorization (EUA). This EUA will remain  in effect (meaning this test can be used) for the duration of the COVID-19 declaration under Section 564(b)(1) of the Act, 21 U.S.C.section 360bbb-3(b)(1), unless the authorization is terminated  or revoked sooner.       Influenza A by PCR NEGATIVE NEGATIVE Final   Influenza B by PCR  NEGATIVE NEGATIVE Final    Comment: (NOTE) The Xpert Xpress SARS-CoV-2/FLU/RSV plus assay is intended as an aid in the diagnosis of influenza from Nasopharyngeal swab specimens and should not be used as a sole basis for treatment. Nasal washings and aspirates are unacceptable for Xpert Xpress SARS-CoV-2/FLU/RSV testing.  Fact Sheet for Patients: BloggerCourse.com  Fact Sheet for Healthcare Providers: SeriousBroker.it  This test is not yet approved or cleared by the [place] FDA and has been authorized for detection and/or diagnosis of SARS-CoV-2 by FDA under an Emergency Use Authorization (EUA). This EUA will remain in effect (meaning this test can be used) for the duration of the COVID-19 declaration under Section 564(b)(1) of the Act, 21 U.S.C. section 360bbb-3(b)(1), unless the authorization is terminated or revoked.     Resp Syncytial Virus by PCR NEGATIVE NEGATIVE Final    Comment: (NOTE) Fact Sheet for Patients: BloggerCourse.com  Fact Sheet for Healthcare Providers: SeriousBroker.it  This test is not yet approved or cleared by the [place] FDA and has been authorized for detection and/or diagnosis of SARS-CoV-2 by FDA under an Emergency Use Authorization (EUA). This EUA will remain in effect (meaning this test can be used) for the duration of the COVID-19 declaration under Section 564(b)(1) of the Act, 21 U.S.C. section 360bbb-3(b)(1), unless the authorization is terminated or revoked.  Performed at [facility], [address] [place], [place] [address]   Blood Culture (routine x 2)     Status: None (Preliminary result)   Collection Time: 12/08/22  4:48 PM   Specimen: BLOOD  Result Value Ref Range Status   Specimen Description BLOOD LEFT ANTECUBITAL  Final   Special Requests   Final    BOTTLES DRAWN AEROBIC AND ANAEROBIC Blood Culture results  may not be optimal due to an excessive volume of blood received in culture bottles   Culture   Final    NO GROWTH 2 DAYS Performed at [facility], [address], [place], [place] [address]    Report Status PENDING  Incomplete  Blood Culture (routine x 2)     Status: None (Preliminary result)   Collection Time: 12/08/22  5:01 PM   Specimen: BLOOD RIGHT HAND  Result Value Ref Range Status   Specimen Description BLOOD RIGHT HAND  Final   Special Requests   Final    BOTTLES DRAWN AEROBIC AND ANAEROBIC Blood Culture adequate volume   Culture   Final    NO GROWTH 2 DAYS Performed at [facility], [address] [place], [place] [address]    Report Status PENDING  Incomplete         Radiology Studies: US Venous Img Upper Uni Right(DVT)  Result Date: 12/09/2022 CLINICAL DATA:  Right upper extremity swelling. EXAM: RIGHT UPPER EXTREMITY VENOUS DOPPLER ULTRASOUND TECHNIQUE: Gray-scale sonography with graded compression, as well as color Doppler and duplex  ultrasound were performed to evaluate the upper extremity deep venous system from the level of the subclavian vein and including the jugular, axillary, basilic, radial, ulnar and upper cephalic vein. Spectral Doppler was utilized to evaluate flow at rest and with distal augmentation maneuvers. COMPARISON:  None Available. FINDINGS: Contralateral Subclavian Vein: Respiratory phasicity is normal and symmetric with the symptomatic side. No evidence of thrombus. Normal compressibility. Internal Jugular Vein: No evidence of thrombus. Normal compressibility, respiratory phasicity and response to augmentation. Subclavian Vein: No evidence of thrombus. Normal compressibility, respiratory phasicity and response to augmentation. Axillary Vein: No evidence of thrombus. Normal compressibility, respiratory phasicity and response to augmentation. Cephalic Vein: Thrombus extends throughout the cephalic vein with lack of compressibility.  Thrombus is occlusive. Basilic Vein: No evidence of thrombus. Normal compressibility, respiratory phasicity and response to augmentation. Brachial Veins: No evidence of thrombus. Normal compressibility, respiratory phasicity and response to augmentation. Radial Veins: No evidence of thrombus. Normal compressibility, respiratory phasicity and response to augmentation. Ulnar Veins: No evidence of thrombus. Normal compressibility, respiratory phasicity and response to augmentation. Venous Reflux:  None visualized. Other Findings: Thrombus in the antecubital cephalic vein at the prior IV site demonstrates an echogenic focus that could reflect a foreign body. It causes posterior acoustic shadowing. IMPRESSION: 1. Occlusive thrombosis noted throughout the cephalic vein. 2. Echogenic focus within the thrombosed cephalic vein in the antecubital fossa, possibly a foreign body. 3. No other evidence of right upper extremity deep venous thrombosis. Electronically Signed   By: Amie Portland M.D.   On: 12/09/2022 15:47   CT CHEST ABDOMEN PELVIS W CONTRAST  Result Date: 12/08/2022 CLINICAL DATA:  Sepsis EXAM: CT CHEST, ABDOMEN, AND PELVIS WITH CONTRAST TECHNIQUE: Multidetector CT imaging of the chest, abdomen and pelvis was performed following the standard protocol during bolus administration of intravenous contrast. RADIATION DOSE REDUCTION: This exam was performed according to the departmental dose-optimization program which includes automated exposure control, adjustment of the mA and/or kV according to patient size and/or use of iterative reconstruction technique. CONTRAST:  OMNIPAQUE IOHEXOL 300 MG/ML  SOLN COMPARISON:  Chest x-ray 12/08/2022, CT angio chest 08/27/2014 FINDINGS: CT CHEST FINDINGS Cardiovascular: Prominent sized heart size. No significant pericardial effusion. The thoracic aorta is normal in caliber. Severe atherosclerotic plaque of the thoracic aorta including aortic valve leaflet calcifications.  Four-vessel coronary artery calcifications status post coronary artery bypass graft. Mediastinum/Nodes: No enlarged mediastinal, hilar, or axillary lymph nodes. Thyroid gland, trachea, and esophagus demonstrate no significant findings. Lungs/Pleura: No focal consolidation. No pulmonary nodule. No pulmonary mass. Trace to small right and trace left pleural effusions. No pneumothorax. Musculoskeletal: No chest wall abnormality. No suspicious lytic or blastic osseous lesions. No acute displaced fracture. Similar-appearing vertebral body hemangioma of the T9 vertebral level. Multilevel degenerative changes of the spine. CT ABDOMEN PELVIS FINDINGS Hepatobiliary: No focal liver abnormality. Calcified gallstone noted within the gallbladder lumen. No gallbladder wall thickening or pericholecystic fluid. No biliary dilatation. Pancreas: Slight hazy contour of the uncinate process of the pancreas. No focal lesion. No main pancreatic ductal dilatation. Spleen: Normal in size without focal abnormality. Adrenals/Urinary Tract: No adrenal nodule bilaterally. Right nephrolithiasis measuring up to 3 mm. No left nephrolithiasis. No ureterolithiasis bilaterally. No hydroureteronephrosis. Urinary bladder is decompressed with circumferential urinary bladder wall thickening and mild perivesicular fat stranding. Foley catheter tip and balloon terminate within the urinary bladder lumen. Stomach/Bowel: Stomach is within normal limits. No evidence of bowel wall thickening or dilatation. Stool throughout the colon. Appendix appears normal. Vascular/Lymphatic: No abdominal  aorta or iliac aneurysm. Severe atherosclerotic plaque of the aorta and its branches. No abdominal, pelvic, or inguinal lymphadenopathy. Reproductive: Prostate is unremarkable. Other: Trace fat stranding and free fluid noted along the uncinate process of the pancreas as well as third portion of the duodenum. Trace free fluid courses into the pelvis and lower abdomen. No  intraperitoneal free gas. No organized fluid collection. Musculoskeletal: Small fat containing umbilical hernia. Diffuse subcutaneus soft tissue edema. Decubitus ulcer at the level of the sacrum. No organized fluid collection. Associated cortical erosion of the distal most S5 level and coccyx. No suspicious lytic or blastic osseous lesions. No acute displaced fracture. Multilevel degenerative changes of the spine. IMPRESSION: 1. Decubitus ulcer at the level of the sacrum. Associated cortical erosion of the distal most S5 level and coccyx suggestive of osteomyelitis. Recommend correlation with MRI (with intravenous contrast if GFR greater than 30). 2. Trace to small right and trace left pleural effusions. 3. Question acute pancreatitis-correlate with lipase levels. Likely reactive changes of the duodenum. 4. Cholelithiasis no CT finding of acute cholecystitis. If clinically indicated, consider right upper quadrant ultrasound for further evaluation. 5. Decompressed urinary bladder with associated wall thickening and trace perivesicular fat stranding. Consider correlation with urinalysis to exclude infection in the setting of a Foley catheter in appropriate position. 6. Diffuse subcutaneus soft tissue edema along the abdomen and pelvis. 7. Aortic Atherosclerosis (ICD10-I70.0) including four-vessel coronary artery and aortic valve leaflet calcifications-correlate for aortic stenosis. Status post coronary artery bypass graft. Electronically Signed   By: Tish Frederickson M.D.   On: 12/08/2022 18:17   DG Chest Port 1 View  Result Date: 12/08/2022 CLINICAL DATA:  Questionable sepsis EXAM: PORTABLE CHEST 1 VIEW COMPARISON:  Chest x-ray 11/24/2022 FINDINGS: The heart is enlarged. There are atelectatic changes in the right lower lung. There central pulmonary vascular congestion. There is no pleural effusion or pneumothorax. No acute fractures are seen. IMPRESSION: 1. Cardiomegaly with central pulmonary vascular congestion.  2. Atelectatic changes in the right lower lung. Electronically Signed   By: Darliss Cheney M.D.   On: 12/08/2022 17:14        Scheduled Meds:  acidophilus  1 capsule Oral Q12H   apixaban  5 mg Oral BID   cholecalciferol  5,000 Units Oral Daily   cyanocobalamin  1,000 mcg Oral Daily   feeding supplement  237 mL Oral BID BM   gabapentin  100 mg Oral Daily   gabapentin  200 mg Oral QHS   loratadine  10 mg Oral Daily   midodrine  10 mg Oral TID WC   mirtazapine  7.5 mg Oral QHS   mometasone-formoterol  2 puff Inhalation BID   multivitamin with minerals  1 tablet Oral Daily   pantoprazole  40 mg Oral Daily   risperiDONE  1.5 mg Oral QHS   senna  2 tablet Oral q AM   simvastatin  20 mg Oral QHS   Continuous Infusions:  cefTRIAXone (ROCEPHIN)  IV 2 g (12/09/22 2348)   lactated ringers 100 mL/hr at 12/09/22 1857   vancomycin 750 mg (12/10/22 0835)     LOS: 2 days     Tresa Moore, MD [facility]   If 7PM-7AM, please contact night-coverage  12/10/2022, 1:40 PM

## 2022-12-11 ENCOUNTER — Inpatient Hospital Stay: Payer: Medicare Other

## 2022-12-11 ENCOUNTER — Other Ambulatory Visit: Payer: Self-pay

## 2022-12-11 DIAGNOSIS — M4628 Osteomyelitis of vertebra, sacral and sacrococcygeal region: Secondary | ICD-10-CM

## 2022-12-11 DIAGNOSIS — A419 Sepsis, unspecified organism: Secondary | ICD-10-CM | POA: Diagnosis not present

## 2022-12-11 LAB — CBC WITH DIFFERENTIAL/PLATELET
Abs Immature Granulocytes: 0.02 10*3/uL (ref 0.00–0.07)
Basophils Absolute: 0 10*3/uL (ref 0.0–0.1)
Basophils Relative: 0 %
Eosinophils Absolute: 0.8 10*3/uL — ABNORMAL HIGH (ref 0.0–0.5)
Eosinophils Relative: 9 %
HCT: 30 % — ABNORMAL LOW (ref 39.0–52.0)
Hemoglobin: 8.8 g/dL — ABNORMAL LOW (ref 13.0–17.0)
Immature Granulocytes: 0 %
Lymphocytes Relative: 14 %
Lymphs Abs: 1.2 10*3/uL (ref 0.7–4.0)
MCH: 27.1 pg (ref 26.0–34.0)
MCHC: 29.3 g/dL — ABNORMAL LOW (ref 30.0–36.0)
MCV: 92.3 fL (ref 80.0–100.0)
Monocytes Absolute: 0.7 10*3/uL (ref 0.1–1.0)
Monocytes Relative: 8 %
Neutro Abs: 5.9 10*3/uL (ref 1.7–7.7)
Neutrophils Relative %: 69 %
Platelets: 414 10*3/uL — ABNORMAL HIGH (ref 150–400)
RBC: 3.25 MIL/uL — ABNORMAL LOW (ref 4.22–5.81)
RDW: 21 % — ABNORMAL HIGH (ref 11.5–15.5)
WBC: 8.6 10*3/uL (ref 4.0–10.5)
nRBC: 0 % (ref 0.0–0.2)

## 2022-12-11 LAB — MAGNESIUM: Magnesium: 1.8 mg/dL (ref 1.7–2.4)

## 2022-12-11 LAB — BASIC METABOLIC PANEL
Anion gap: 6 (ref 5–15)
BUN: 7 mg/dL — ABNORMAL LOW (ref 8–23)
CO2: 24 mmol/L (ref 22–32)
Calcium: 8.1 mg/dL — ABNORMAL LOW (ref 8.9–10.3)
Chloride: 111 mmol/L (ref 98–111)
Creatinine, Ser: 0.54 mg/dL — ABNORMAL LOW (ref 0.61–1.24)
GFR, Estimated: >60 mL/min (ref >60)
Glucose, Bld: 94 mg/dL (ref 70–99)
Potassium: 3.7 mmol/L (ref 3.5–5.1)
Sodium: 141 mmol/L (ref 135–145)

## 2022-12-11 LAB — BRAIN NATRIURETIC PEPTIDE: B Natriuretic Peptide: 712.5 pg/mL — ABNORMAL HIGH (ref 0.0–100.0)

## 2022-12-11 LAB — GLUCOSE, CAPILLARY
Glucose-Capillary: 84 mg/dL (ref 70–99)
Glucose-Capillary: 135 mg/dL — ABNORMAL HIGH (ref 70–99)
Glucose-Capillary: 119 mg/dL — ABNORMAL HIGH (ref 70–99)

## 2022-12-11 LAB — CULTURE, BLOOD (ROUTINE X 2): Culture: NO GROWTH

## 2022-12-11 MED ORDER — SODIUM CHLORIDE 0.9% FLUSH
10.0000 mL | Freq: Two times a day (BID) | INTRAVENOUS | Status: DC
  Administered 2022-12-11 – 2022-12-13 (×4): 10 mL

## 2022-12-11 MED ORDER — DOXYCYCLINE HYCLATE 100 MG PO TABS
100.0000 mg | ORAL_TABLET | Freq: Two times a day (BID) | ORAL | Status: DC
  Administered 2022-12-11 – 2022-12-13 (×4): 100 mg via ORAL
  Filled 2022-12-11 (×4): qty 1

## 2022-12-11 MED ORDER — SODIUM CHLORIDE 0.9% FLUSH: 10.0000 mL | INTRAVENOUS | Status: DC | PRN

## 2022-12-11 MED ORDER — MORPHINE SULFATE (PF) 2 MG/ML IV SOLN
2.0000 mg | INTRAVENOUS | Status: DC | PRN
  Administered 2022-12-11 – 2022-12-13 (×3): 2 mg via INTRAVENOUS
  Filled 2022-12-11 (×3): qty 1

## 2022-12-11 MED ORDER — SODIUM CHLORIDE 0.9 % IV SOLN
2.0000 g | Freq: Three times a day (TID) | INTRAVENOUS | Status: DC
  Administered 2022-12-11 – 2022-12-13 (×7): 2 g via INTRAVENOUS
  Filled 2022-12-11: qty 12.5
  Filled 2022-12-11: qty 2
  Filled 2022-12-11 (×3): qty 12.5
  Filled 2022-12-11: qty 2
  Filled 2022-12-11 (×2): qty 12.5

## 2022-12-11 MED ORDER — VITAMIN C 500 MG PO TABS
500.0000 mg | ORAL_TABLET | Freq: Two times a day (BID) | ORAL | Status: DC
  Administered 2022-12-11 – 2022-12-13 (×5): 500 mg via ORAL
  Filled 2022-12-11 (×5): qty 1

## 2022-12-11 MED ORDER — ENSURE ENLIVE PO LIQD
237.0000 mL | Freq: Three times a day (TID) | ORAL | Status: DC
  Administered 2022-12-11 – 2022-12-13 (×7): 237 mL via ORAL

## 2022-12-11 MED ORDER — OXYCODONE HCL 5 MG PO TABS
5.0000 mg | ORAL_TABLET | ORAL | Status: DC | PRN
  Administered 2022-12-11 – 2022-12-12 (×3): 10 mg via ORAL
  Filled 2022-12-11 (×3): qty 2

## 2022-12-11 MED ORDER — METRONIDAZOLE 500 MG PO TABS
500.0000 mg | ORAL_TABLET | Freq: Two times a day (BID) | ORAL | Status: DC
  Administered 2022-12-11 – 2022-12-13 (×5): 500 mg via ORAL
  Filled 2022-12-11 (×5): qty 1

## 2022-12-11 MED ORDER — ZINC SULFATE 220 (50 ZN) MG PO CAPS
220.0000 mg | ORAL_CAPSULE | Freq: Every day | ORAL | Status: DC
  Administered 2022-12-11 – 2022-12-12 (×2): 220 mg via ORAL
  Filled 2022-12-11 (×2): qty 1

## 2022-12-11 NOTE — Consult Note (Signed)
[facility]  Total days of antibiotics 4 Reason for Consult:sacral osteomyelitis    Referring Physician: sreenath  Principal Problem:   Sepsis (HCC) Active Problems:   CORONARY ATHEROSCLEROSIS, ARTERY BYPASS GRAFT   Diabetes mellitus (HCC)   Paranoid schizophrenia (HCC)   Persistent atrial fibrillation (HCC)   COPD, mild (HCC)   Sacral osteomyelitis (HCC)    HPI: Evan [name] is a 67 y.o. male hx of DM, COPD, CAD, paranoid schizophrenia, who was recently hospitalized during 4/19-4/25 for covid and sacral osteo was discharged on cephalexin and flagyl, and wound vac to SNF but readmitted due to fever of 100.58F, hypotension in sBP 80s and tachycardia in 120s, with LA of 2.6. WBC of 14.6K. his imaging shows S5-coccygeal osteomeylitis. He was started on iv vanco, cefepime, and flagyl in addition for fluid hydration.  He had wound tissue cx and bone cx by dr Aleen Campi on 4/20 those cultures only showed few GNR (since he had been on abtx at hte time of surgery) patient reports pain to buttock. Patient also being seen by wound care team and possibly surgery. Also pain to right arm - found to have cephalic vein thrombosis  Past Medical History:  Diagnosis Date   Anginal pain (HCC)    Anxiety disorder    Asthma    Atresia of esophagus without fistula    CAD (coronary artery disease)    Cellulitis    CHF (congestive heart failure) (HCC)    NYHA CLASS III,CHRONIC,DIASTOLIC   COPD (chronic obstructive pulmonary disease) (HCC)    COVID-19    Diabetes mellitus without complication (HCC)    Edema    RIGHT LOWER LEG   Gastroesophageal reflux    H/O: GI bleed    History of pneumonia    Remote   History of scarlet fever    Childhood   Hyperlipidemia    Hypertension    Myocardial infarction (HCC) 2009   Obesity    Obstructive sleep apnea    Pain    CHRONIC BACK / ABDOMINAL   Panic disorder    Peripheral venous insufficiency    PTSD (post-traumatic stress  disorder)    Retinopathy    DIABETIC   Stasis, venous    Stroke (HCC)    Vertigo     Allergies:  Allergies  Allergen Reactions   Penicillin G Hives    Childhood reaction   Sulfa Antibiotics Hives   Tiotropium    Zoloft [Sertraline Hcl] Other (See Comments)     MEDICATIONS:  acidophilus  1 capsule Oral Q12H   apixaban  5 mg Oral BID   Chlorhexidine Gluconate Cloth  6 each Topical Daily   cholecalciferol  5,000 Units Oral Daily   cyanocobalamin  1,000 mcg Oral Daily   doxycycline  100 mg Oral Q12H   feeding supplement  237 mL Oral BID BM   gabapentin  100 mg Oral Daily   gabapentin  200 mg Oral QHS   loratadine  10 mg Oral Daily   midodrine  10 mg Oral TID WC   mirtazapine  7.5 mg Oral QHS   mometasone-formoterol  2 puff Inhalation BID   multivitamin with minerals  1 tablet Oral Daily   pantoprazole  40 mg Oral Daily   risperiDONE  1.5 mg Oral QHS   senna  2 tablet Oral q AM   simvastatin  20 mg Oral QHS    Social History   Tobacco Use   Smoking status: Never  Smokeless tobacco: Never  Vaping Use   Vaping Use: Never used  Substance Use Topics   Alcohol use: No   Drug use: No    Family History  Problem Relation Age of Onset   Heart attack Mother    Hypertension Mother    Hyperlipidemia Mother    Heart attack Brother 40       MI   Coronary artery disease Other     Review of Systems -  12 point ros is negative except what is mentioned in hpi  OBJECTIVE: Temp:  [97.6 F (36.4 C)-98.7 F (37.1 C)] 97.6 F (36.4 C) (05/06 0743) Pulse Rate:  [87-106] 87 (05/06 0743) Resp:  [12-20] 16 (05/06 0743) BP: (105-126)/(56-95) 111/67 (05/06 0743) SpO2:  [97 %-100 %] 100 % (05/06 0743) Physical Exam  Constitutional: He is oriented to person, place, and time. He appears well-developed and well-nourished. No distress.  HENT:  Mouth/Throat: Oropharynx is clear and moist. No oropharyngeal exudate.  Cardiovascular: Normal rate, regular rhythm and normal heart  sounds. Exam reveals no gallop and no friction rub.  No murmur heard.  Pulmonary/Chest: Effort normal and breath sounds normal. No respiratory distress. He has no wheezes.  Abdominal: Soft. Bowel sounds are normal. He exhibits no distension. There is no tenderness.  Lymphadenopathy:  He has no cervical adenopathy.  Neurological: He is alert and oriented to person, place, and time.  Skin: Skin is warm and dry. No rash noted. No erythema.  Ext: +pitting edema to left leg Psychiatric: He has a normal mood and affect. His behavior is normal.    LABS: Results for orders placed or performed during the hospital encounter of 12/08/22 (from the past 48 hour(s))  CBG monitoring, ED     Status: None   Collection Time: 12/09/22  3:33 PM  Result Value Ref Range   Glucose-Capillary 99 70 - 99 mg/dL    Comment: Glucose reference range applies only to samples taken after fasting for at least 8 hours.  Glucose, capillary     Status: Abnormal   Collection Time: 12/09/22  5:41 PM  Result Value Ref Range   Glucose-Capillary 100 (H) 70 - 99 mg/dL    Comment: Glucose reference range applies only to samples taken after fasting for at least 8 hours.  Lactic acid, plasma     Status: None   Collection Time: 12/09/22  7:19 PM  Result Value Ref Range   Lactic Acid, Venous 1.6 0.5 - 1.9 mmol/L    Comment: Performed at [facility], [address] [place], [place] [address]  Glucose, capillary     Status: None   Collection Time: 12/09/22  9:04 PM  Result Value Ref Range   Glucose-Capillary 93 70 - 99 mg/dL    Comment: Glucose reference range applies only to samples taken after fasting for at least 8 hours.  Creatinine, serum     Status: Abnormal   Collection Time: 12/10/22  3:57 AM  Result Value Ref Range   Creatinine, Ser 0.56 (L) 0.61 - 1.24 mg/dL   GFR, Estimated >60 >45 mL/min    Comment: (NOTE) Calculated using the CKD-EPI Creatinine Equation (2021) Performed at [facility], [address] [place], [place] [address]   Glucose, capillary     Status: None   Collection Time: 12/10/22  7:48 AM  Result Value Ref Range   Glucose-Capillary 91 70 - 99 mg/dL    Comment: Glucose reference range applies only to samples taken after fasting for  at least 8 hours.  CBC with Differential/Platelet     Status: Abnormal   Collection Time: 12/10/22  8:09 AM  Result Value Ref Range   WBC 8.9 4.0 - 10.5 K/uL   RBC 2.82 (L) 4.22 - 5.81 MIL/uL   Hemoglobin 7.7 (L) 13.0 - 17.0 g/dL   HCT 16.1 (L) 09.6 - 04.5 %   MCV 91.8 80.0 - 100.0 fL   MCH 27.3 26.0 - 34.0 pg   MCHC 29.7 (L) 30.0 - 36.0 g/dL   RDW 40.9 (H) 81.1 - 91.4 %   Platelets 398 150 - 400 K/uL   nRBC 0.0 0.0 - 0.2 %   Neutrophils Relative % 66 %   Neutro Abs 5.9 1.7 - 7.7 K/uL   Lymphocytes Relative 15 %   Lymphs Abs 1.3 0.7 - 4.0 K/uL   Monocytes Relative 8 %   Monocytes Absolute 0.7 0.1 - 1.0 K/uL   Eosinophils Relative 11 %   Eosinophils Absolute 1.0 (H) 0.0 - 0.5 K/uL   Basophils Relative 0 %   Basophils Absolute 0.0 0.0 - 0.1 K/uL   WBC Morphology MORPHOLOGY UNREMARKABLE    Smear Review Normal platelet morphology    Immature Granulocytes 0 %   Abs Immature Granulocytes 0.02 0.00 - 0.07 K/uL   Burr Cells PRESENT    Polychromasia PRESENT     Comment: Performed at [facility], [address], [place], [place] [address]  Basic metabolic panel     Status: Abnormal   Collection Time: 12/10/22  8:09 AM  Result Value Ref Range   Sodium 138 135 - 145 mmol/L   Potassium 3.3 (L) 3.5 - 5.1 mmol/L   Chloride 110 98 - 111 mmol/L   CO2 24 22 - 32 mmol/L   Glucose, Bld 91 70 - 99 mg/dL    Comment: Glucose reference range applies only to samples taken after fasting for at least 8 hours.   BUN 7 (L) 8 - 23 mg/dL   Creatinine, Ser 6.21 (L) 0.61 - 1.24 mg/dL   Calcium 7.6 (L) 8.9 - 10.3 mg/dL   GFR, Estimated >30 >86 mL/min    Comment: (NOTE) Calculated using the CKD-EPI Creatinine Equation (2021)     Anion gap 4 (L) 5 - 15    Comment: Performed at [facility], [address] [place], [place] [address]  Glucose, capillary     Status: Abnormal   Collection Time: 12/10/22 12:07 PM  Result Value Ref Range   Glucose-Capillary 134 (H) 70 - 99 mg/dL    Comment: Glucose reference range applies only to samples taken after fasting for at least 8 hours.  Glucose, capillary     Status: Abnormal   Collection Time: 12/10/22  4:41 PM  Result Value Ref Range   Glucose-Capillary 107 (H) 70 - 99 mg/dL    Comment: Glucose reference range applies only to samples taken after fasting for at least 8 hours.  Glucose, capillary     Status: None   Collection Time: 12/10/22  9:32 PM  Result Value Ref Range   Glucose-Capillary 91 70 - 99 mg/dL    Comment: Glucose reference range applies only to samples taken after fasting for at least 8 hours.  Glucose, capillary     Status: None   Collection Time: 12/11/22  7:45 AM  Result Value Ref Range   Glucose-Capillary 84 70 - 99 mg/dL    Comment: Glucose reference range applies only to samples taken after fasting for at least  8 hours.  CBC with Differential/Platelet     Status: Abnormal   Collection Time: 12/11/22  7:51 AM  Result Value Ref Range   WBC 8.6 4.0 - 10.5 K/uL   RBC 3.25 (L) 4.22 - 5.81 MIL/uL   Hemoglobin 8.8 (L) 13.0 - 17.0 g/dL   HCT 16.1 (L) 09.6 - 04.5 %   MCV 92.3 80.0 - 100.0 fL   MCH 27.1 26.0 - 34.0 pg   MCHC 29.3 (L) 30.0 - 36.0 g/dL   RDW 40.9 (H) 81.1 - 91.4 %   Platelets 414 (H) 150 - 400 K/uL   nRBC 0.0 0.0 - 0.2 %   Neutrophils Relative % 69 %   Neutro Abs 5.9 1.7 - 7.7 K/uL   Lymphocytes Relative 14 %   Lymphs Abs 1.2 0.7 - 4.0 K/uL   Monocytes Relative 8 %   Monocytes Absolute 0.7 0.1 - 1.0 K/uL   Eosinophils Relative 9 %   Eosinophils Absolute 0.8 (H) 0.0 - 0.5 K/uL   Basophils Relative 0 %   Basophils Absolute 0.0 0.0 - 0.1 K/uL   Immature Granulocytes 0 %   Abs Immature Granulocytes 0.02 0.00 - 0.07 K/uL     Comment: Performed at [facility], [address] [place], [place] [address]  Basic metabolic panel     Status: Abnormal   Collection Time: 12/11/22  7:51 AM  Result Value Ref Range   Sodium 141 135 - 145 mmol/L   Potassium 3.7 3.5 - 5.1 mmol/L   Chloride 111 98 - 111 mmol/L   CO2 24 22 - 32 mmol/L   Glucose, Bld 94 70 - 99 mg/dL    Comment: Glucose reference range applies only to samples taken after fasting for at least 8 hours.   BUN 7 (L) 8 - 23 mg/dL   Creatinine, Ser 6.21 (L) 0.61 - 1.24 mg/dL   Calcium 8.1 (L) 8.9 - 10.3 mg/dL   GFR, Estimated >30 >86 mL/min    Comment: (NOTE) Calculated using the CKD-EPI Creatinine Equation (2021)    Anion gap 6 5 - 15    Comment: Performed at [facility], [address], [place], [place] [address]  Magnesium     Status: None   Collection Time: 12/11/22  7:51 AM  Result Value Ref Range   Magnesium 1.8 1.7 - 2.4 mg/dL    Comment: Performed at [facility], [address], [place], [place] [address]  Brain natriuretic peptide     Status: Abnormal   Collection Time: 12/11/22  7:51 AM  Result Value Ref Range   B Natriuretic Peptide 712.5 (H) 0.0 - 100.0 pg/mL    Comment: Performed at [facility], [address] [place], [place] [address]    MICRO: 5/3 blood cx IMAGING: [place] EKG SITE RITE  Result Date: 12/11/2022 If Site Rite image not attached, placement could not be confirmed due to current cardiac rhythm.  US Venous Img Upper Uni Right(DVT)  Result Date: 12/09/2022 CLINICAL DATA:  Right upper extremity swelling. EXAM: RIGHT UPPER EXTREMITY VENOUS DOPPLER ULTRASOUND TECHNIQUE: Gray-scale sonography with graded compression, as well as color Doppler and duplex ultrasound were performed to evaluate the upper extremity deep venous system from the level of the subclavian vein and including the jugular, axillary, basilic, radial, ulnar and upper cephalic vein. Spectral Doppler was utilized to evaluate  flow at rest and with distal augmentation maneuvers. COMPARISON:  None Available. FINDINGS: Contralateral Subclavian Vein: Respiratory phasicity is normal and symmetric  with the symptomatic side. No evidence of thrombus. Normal compressibility. Internal Jugular Vein: No evidence of thrombus. Normal compressibility, respiratory phasicity and response to augmentation. Subclavian Vein: No evidence of thrombus. Normal compressibility, respiratory phasicity and response to augmentation. Axillary Vein: No evidence of thrombus. Normal compressibility, respiratory phasicity and response to augmentation. Cephalic Vein: Thrombus extends throughout the cephalic vein with lack of compressibility. Thrombus is occlusive. Basilic Vein: No evidence of thrombus. Normal compressibility, respiratory phasicity and response to augmentation. Brachial Veins: No evidence of thrombus. Normal compressibility, respiratory phasicity and response to augmentation. Radial Veins: No evidence of thrombus. Normal compressibility, respiratory phasicity and response to augmentation. Ulnar Veins: No evidence of thrombus. Normal compressibility, respiratory phasicity and response to augmentation. Venous Reflux:  None visualized. Other Findings: Thrombus in the antecubital cephalic vein at the prior IV site demonstrates an echogenic focus that could reflect a foreign body. It causes posterior acoustic shadowing. IMPRESSION: 1. Occlusive thrombosis noted throughout the cephalic vein. 2. Echogenic focus within the thrombosed cephalic vein in the antecubital fossa, possibly a foreign body. 3. No other evidence of right upper extremity deep venous thrombosis. Electronically Signed   By: Amie Portland M.D.   On: 12/09/2022 15:47     Assessment/Plan:  67yo M with sepsis due to sacral wound - previously on oral abtx. Also had right arm swelling due to cephalic vein thrombosis  - recommend to change iv abtx to cefepime iv plus oral doxycycline 100mg  bid and  oral metronidazole 500mg  po bid. ( MRSA negatie in 09/27/22) cx only found GNR in the past on gram stain - plan to get picc line today (left arm )  to treat for 6 wk of IV therapy. - continue with wound care and wound vac - will see back with dr Rivka Safer in 4-6 wk  - left leg swelling = recommend rule out dvt   ---------------------------------------  Diagnosis: Coccygeal osteo  Culture Result: none  Allergies  Allergen Reactions   Penicillin G Hives    Childhood reaction   Sulfa Antibiotics Hives   Tiotropium    Zoloft [Sertraline Hcl] Other (See Comments)    OPAT Orders Discharge antibiotics to be given via PICC line Discharge antibiotics: Per pharmacy protocol cefepime plus oral doxycycline 100mg  po bid, and oral metronidazole 500mg  po bid  Aim for Vancomycin trough 15-20 or AUC 400-550 (unless otherwise indicated) Duration: 6 wk End Date: June 14th 2024  [facility] Care Per Protocol:  Home health RN for IV administration and teaching; PICC line care and labs.    Labs weekly while on IV antibiotics: __x CBC with differential _x_ BMP  __x CRP _x_ ESR  __x Please pull PIC at completion of IV antibiotics  Fax weekly labs to [phone]  Clinic Follow Up Appt: 4-6 wk with dr Rivka Safer  ------------------- I have personally spent 75 minutes involved in face-to-face and non-face-to-face activities for this patient on the day of the visit. Professional time spent includes the following activities: Preparing to see the patient (review of tests), Obtaining and/or reviewing separately obtained history (admission/discharge record), Performing a medically appropriate examination and/or evaluation , Ordering medications/tests/procedures, referring and communicating with other health care professionals, Documenting clinical information in the EMR, Independently interpreting results (not separately reported), Communicating results to the patient/family/caregiver, Counseling  and educating the patient/family/caregiver and Care coordination (not separately reported).

## 2022-12-11 NOTE — Progress Notes (Signed)
PHARMACY CONSULT NOTE FOR:  OUTPATIENT  PARENTERAL ANTIBIOTIC THERAPY (OPAT)  Indication: sacral wound/osteomyelitis Regimen: Cefepime 2gm IV q8h - PO antibiotics doxycyline 100mg  PO BID and metronidazole 500mg  BID End date: 01/19/2023  Weekly labs: CBC/diff, CMP, ESR, and CRP Fax weekly labs to [phone] Please pull PIC at completion of IV antibiotics  IV antibiotic discharge orders are pended. To discharging provider:  please sign these orders via discharge navigator,  Select New Orders & click on the button choice - Manage This Unsigned Work.     Thank you for allowing pharmacy to be a part of this patient's care.  Juliette Alcide, PharmD, BCPS, BCIDP Work Cell: [phone] 12/11/2022 12:18 PM

## 2022-12-11 NOTE — Consult Note (Signed)
WOC Nurse Consult Note: Reason for Consult: sacral wound. Familiar to [facility] nursing team. Last seen 11/29/22 for NPWT dressing, however appears patient has had some issue with bleeding as well. Pending surgical evaluation this admission.  + osteo/ID consulted. From SNF. Wound type: Stage 4 Pressure injury with surgical debridement 11/25/22 Pressure Injury POA: Yes Measurement: 8cm x 8cm x 5cm x with tunneling at 7-8 o'clock that is 7cm    Wound bed: 2 silk sutures in base (previous issues with bleeding). Otherwise early granulation tissue, palpable bone; some fibrinous material throughout  Drainage (amount, consistency, odor) serosanguinous  Periwound:macerated, moist, area between anus and wound edges extremely moist  Dressing procedure/placement/frequency: Low air loss mattress ordered for patient as well.  Consult to RD for sacral wound/wound healing.  Filled wound with  __2_ pieces of black foam 1/2 ostomy barrier ring used between anus and distal wound edge to aid in seal Sealed NPWT dressing at . Patient received pain medication per bedside nurse prior to dressing change Patient tolerated procedure well He his however complaining of pain and the inability to move his legs.  I have notified MD, surgeon, bedside nursing, and ID MD.  Saline moist gauze dressings to be used if maintaining a seal is difficult due to stool or otherwise. Utilized old "VAC drape" along border between anus and distal aspect of the wound to aid in seal.   WOC nurse will continue to provide NPWT dressing changed due to the complexity of the dressing change.      Rashad Auld [facility], CNS, [employer] [phone]

## 2022-12-11 NOTE — Progress Notes (Signed)
Initial Nutrition Assessment  DOCUMENTATION CODES:   Not applicable  INTERVENTION:   -Liberalize diet to regular for widest variety of meal selections -MVI with minerals daily -500 mg vitamin C BID -220 mg zinc sulfate daily x 14 days -Ensure Enlive po TID, each supplement provides 350 kcal and 20 grams of protein.  -Feeding assistance with meals  NUTRITION DIAGNOSIS:   Increased nutrient needs related to wound healing as evidenced by estimated needs.  GOAL:   Patient will meet greater than or equal to 90% of their needs  MONITOR:   PO intake, Supplement acceptance  REASON FOR ASSESSMENT:   Consult Wound healing  ASSESSMENT:   Pt with medical history significant for paranoid schizophrenia, coronary artery disease, non-insulin-dependent diabetes mellitus with complications of diabetic retinopathy, hypertension, obesity who was recently discharged from the hospital following treatment for an infected sacral decubitus ulcer.  He was discharged to the skilled nursing facility with a wound VAC and on antibiotic therapy to treat sacral osteomyelitis.  Pt admitted with sepsis and sacral osteomyelitis.   Reviewed I/O's: -935 ml x 24 hours and -272 ml since admission  UOP: 1.2 L x 24 hours   Per MD notes, plan for ID consult today.   Per [facility] notes, pt has a stage 4 sacral wound with wound vac. S/p surgical debridement on 11/25/22.  Spoke with pt at bedside, who had flat affect today. He reports not feeling well and not feeling like eating. He denies any difficulty with pain, chewing, or swallowing; just doesn't feel like eating. Noted pt also with some limited dexterity in his hands and arms. Noted meal completions 0-100%. Pt fixated on his leg hurting.   Reviewed wt hx; pt has experienced a 2.7% wt loss over the past 3 months, which is not significant for time frame.  2.7% x 3 months.   Discussed importance of good meal and supplement intake to promote healing. Pt thinks  he will eat better with wider variety of meals selections. Pt reports that he liked the Ensure supplements during last admission and would like to drink again. RD provided pt with one, which he drank in entirety during visit.  Medications reviewed and include vitamin D3, vitamin B-12, remeron, and senokot.   Lab Results  Component Value Date   HGBA1C 6.0 (H) 09/27/2022   PTA DM medications are none.   Labs reviewed: CBGS: 84 (inpatient orders for glycemic control are none).    NUTRITION - FOCUSED PHYSICAL EXAM:  Flowsheet Row Most Recent Value  Orbital Region No depletion  Upper Arm Region No depletion  Thoracic and Lumbar Region No depletion  Buccal Region No depletion  Temple Region No depletion  Clavicle Bone Region No depletion  Clavicle and Acromion Bone Region No depletion  Scapular Bone Region No depletion  Dorsal Hand No depletion  Patellar Region No depletion  Anterior Thigh Region No depletion  Posterior Calf Region No depletion  Edema (RD Assessment) Moderate  Hair Reviewed  Eyes Reviewed  Mouth Reviewed  Skin Reviewed  Nails Reviewed       Diet Order:   Diet Order             Diet regular Fluid consistency: Thin  Diet effective now                   EDUCATION NEEDS:   Education needs have been addressed  Skin:  Skin Assessment: Skin Integrity Issues: Skin Integrity Issues:: Wound VAC Wound Vac: stage IV sacrum  Last BM:  12/10/22 (type 3)  Height:   Ht Readings from Last 1 Encounters:  12/08/22 5\' 5"  (1.651 m)    Weight:   Wt Readings from Last 1 Encounters:  12/08/22 68.8 kg    Ideal Body Weight:  61.8 kg  BMI:  Body mass index is 25.24 kg/m.  Estimated Nutritional Needs:   Kcal:  1850-2050  Protein:  105-120 grams  Fluid:  > 1.8 L    Levada Schilling, RD, LDN, CDCES Registered Dietitian II Certified Diabetes Care and Education Specialist Please refer to [facility] for RD and/or RD on-call/weekend/after hours pager

## 2022-12-11 NOTE — Progress Notes (Signed)
PROGRESS NOTE    Evan [name]  ZOX:[id number] DOB: Oct 15, 1955 DOA: 12/08/2022 PCP: Melonie Florida, FNP    Brief Narrative:  67 y.o. male with medical history significant for paranoid schizophrenia, coronary artery disease, non-insulin-dependent diabetes mellitus with complications of diabetic retinopathy, hypertension, obesity who was recently discharged from the hospital following treatment for an infected sacral decubitus ulcer.  He was discharged to the skilled nursing facility with a wound VAC and on antibiotic therapy to treat sacral osteomyelitis. Patient was sent to the emergency room from the skilled nursing facility for evaluation of weakness and hypotension.  He was noted to have systolic blood pressure in the 80s.  He had a Tmax of 100.4 and was tachycardic with heart rate of 120.  CT scan with decubitus ulcer level of sacrum  Assessment & Plan:   Principal Problem:   Sepsis (HCC) Active Problems:   Sacral osteomyelitis (HCC)   COPD, mild (HCC)   Persistent atrial fibrillation (HCC)   CORONARY ATHEROSCLEROSIS, ARTERY BYPASS GRAFT   Diabetes mellitus (HCC)   Paranoid schizophrenia (HCC)  * Sepsis (HCC) Evidenced by low-grade fever with a Tmax of 99.7, tachycardia, tachypnea and hypotension with systolic blood pressure in the 80s and responded to IV fluid resuscitation Patient also has leukocytosis and a downtrending lactic acid level Source of sepsis appears to be sacral osteomyelitis Patient is status post recent debridement of his sacral decubitus ulcer.  Gram stain yielded few gram-negative rods Plan: Infectious disease consulted.  Antibiotics switched to cefepime and Flagyl.  PICC line ordered as patient will need a 4-week course.    Sacral osteomyelitis [facility]) Patient was hospitalized from 4/19 - 4/25 for management of an infected decubitus ulcer He is status postdebridement and had a bone biopsy which was consistent with acute osteomyelitis Plan: WOC consult  appreciated.  Surgical team made aware.  Local wound care.  Offloading mattress.  Antibiotics as above.   COPD, mild (HCC) Continue as needed bronchodilator therapy as well as inhaled steroids.   Persistent atrial fibrillation (HCC) Continue Eliquis for secondary prophylaxis for an acute stroke   Paranoid schizophrenia (HCC) Continue risperidone and mirtazapine   Diabetes mellitus (HCC) Last hemoglobin A1c was 6 Maintain consistent carbohydrate diet Check blood sugars AC meals   CORONARY ATHEROSCLEROSIS, ARTERY BYPASS GRAFT Continue simvastatin   DVT prophylaxis: Apixaban Code Status: Full Family Communication: None Disposition Plan: Status is: Inpatient Remains inpatient appropriate because: Sepsis secondary to sacral osteomyelitis   Level of care: Progressive  Consultants:  None  Procedures:  None  Antimicrobials: Cefepime Flagyl   Subjective: Seen and examined.  More awake this morning.  Able to provide history.  Complains of pain.  Objective: Vitals:   12/10/22 2007 12/10/22 2349 12/11/22 0339 12/11/22 0743  BP: 120/76 110/71 (!) 117/95 111/67  Pulse: (!) 104 99 (!) 103 87  Resp:  19 12 16   Temp: 98.3 F (36.8 C) 97.6 F (36.4 C) 97.7 F (36.5 C) 97.6 F (36.4 C)  TempSrc: Oral Oral Oral Oral  SpO2: 98% 98% 99% 100%  Weight:      Height:        Intake/Output Summary (Last 24 hours) at 12/11/2022 1231 Last data filed at 12/11/2022 1045 Gross per 24 hour  Intake 1193 ml  Output 1175 ml  Net 18 ml   Filed Weights   12/08/22 1636  Weight: 68.8 kg    Examination:  General exam: NAD Respiratory system: Poor respiratory effort.  Lungs overall clear.  Normal work of  breathing.  Room air Cardiovascular system: S1-S2, RRR, no murmurs Gastrointestinal system: Obese, NT/ND, normal bowel sounds GU: Chronic indwelling Foley Central nervous system: Alert, oriented to person time and place Extremities: Bilateral lower extremity weakness.  He will  protectors in place Skin: Sacral decubitus ulcer Psychiatry: Judgement and insight appear normal. Mood & affect appropriate.     Data Reviewed: I have personally reviewed following labs and imaging studies  CBC: Recent Labs  Lab 12/08/22 1648 12/09/22 0630 12/10/22 0809 12/11/22 0751  WBC 12.7* 14.6* 8.9 8.6  NEUTROABS 9.6*  --  5.9 5.9  HGB 8.6* 8.1* 7.7* 8.8*  HCT 29.0* 27.7* 25.9* 30.0*  MCV 91.5 93.9 91.8 92.3  PLT 432* 425* 398 414*   Basic Metabolic Panel: Recent Labs  Lab 12/08/22 1648 12/09/22 0523 12/10/22 0357 12/10/22 0809 12/11/22 0751  NA 137 138  --  138 141  K 3.5 3.3*  --  3.3* 3.7  CL 108 110  --  110 111  CO2 22 21*  --  24 24  GLUCOSE 156* 106*  --  91 94  BUN 7* 5*  --  7* 7*  CREATININE 0.67 0.57* 0.56* 0.55* 0.54*  CALCIUM 8.0* 7.5*  --  7.6* 8.1*  MG  --   --   --   --  1.8   GFR: Estimated Creatinine Clearance: 76.9 mL/min (A) (by C-G formula based on SCr of 0.54 mg/dL (L)). Liver Function Tests: Recent Labs  Lab 12/08/22 1648  AST 23  ALT 11  ALKPHOS 99  BILITOT 0.5  PROT 5.3*  ALBUMIN 2.1*   Recent Labs  Lab 12/08/22 1649  LIPASE 34   No results for input(s): "AMMONIA" in the last 168 hours. Coagulation Profile: Recent Labs  Lab 12/08/22 1648 12/09/22 0523  INR 1.2 1.6*   Cardiac Enzymes: No results for input(s): "CKTOTAL", "CKMB", "CKMBINDEX", "TROPONINI" in the last 168 hours. BNP (last 3 results) No results for input(s): "PROBNP" in the last 8760 hours. HbA1C: No results for input(s): "HGBA1C" in the last 72 hours. CBG: Recent Labs  Lab 12/10/22 0748 12/10/22 1207 12/10/22 1641 12/10/22 2132 12/11/22 0745  GLUCAP 91 134* 107* 91 84   Lipid Profile: No results for input(s): "CHOL", "HDL", "LDLCALC", "TRIG", "CHOLHDL", "LDLDIRECT" in the last 72 hours. Thyroid Function Tests: No results for input(s): "TSH", "T4TOTAL", "FREET4", "T3FREE", "THYROIDAB" in the last 72 hours. Anemia Panel: No results for  input(s): "VITAMINB12", "FOLATE", "FERRITIN", "TIBC", "IRON", "RETICCTPCT" in the last 72 hours. Sepsis Labs: Recent Labs  Lab 12/08/22 1648 12/08/22 1834 12/09/22 0523 12/09/22 1919  PROCALCITON  --   --  <0.10  --   LATICACIDVEN 2.6* 2.0*  --  1.6    Recent Results (from the past 240 hour(s))  Resp panel by RT-PCR (RSV, Flu A&B, Covid) Anterior Nasal Swab     Status: None   Collection Time: 12/08/22  4:48 PM   Specimen: Anterior Nasal Swab  Result Value Ref Range Status   SARS Coronavirus 2 by RT PCR NEGATIVE NEGATIVE Final    Comment: (NOTE) SARS-CoV-2 target nucleic acids are NOT DETECTED.  The SARS-CoV-2 RNA is generally detectable in upper respiratory specimens during the acute phase of infection. The lowest concentration of SARS-CoV-2 viral copies this assay can detect is 138 copies/mL. A negative result does not preclude SARS-Cov-2 infection and should not be used as the sole basis for treatment or other patient management decisions. A negative result may occur with  improper specimen collection/handling, submission  of specimen other than nasopharyngeal swab, presence of viral mutation(s) within the areas targeted by this assay, and inadequate number of viral copies(<138 copies/mL). A negative result must be combined with clinical observations, patient history, and epidemiological information. The expected result is Negative.  Fact Sheet for Patients:  BloggerCourse.com  Fact Sheet for Healthcare Providers:  SeriousBroker.it  This test is no t yet approved or cleared by the [place] FDA and  has been authorized for detection and/or diagnosis of SARS-CoV-2 by FDA under an Emergency Use Authorization (EUA). This EUA will remain  in effect (meaning this test can be used) for the duration of the COVID-19 declaration under Section 564(b)(1) of the Act, 21 U.S.C.section 360bbb-3(b)(1), unless the authorization is  terminated  or revoked sooner.       Influenza A by PCR NEGATIVE NEGATIVE Final   Influenza B by PCR NEGATIVE NEGATIVE Final    Comment: (NOTE) The Xpert Xpress SARS-CoV-2/FLU/RSV plus assay is intended as an aid in the diagnosis of influenza from Nasopharyngeal swab specimens and should not be used as a sole basis for treatment. Nasal washings and aspirates are unacceptable for Xpert Xpress SARS-CoV-2/FLU/RSV testing.  Fact Sheet for Patients: BloggerCourse.com  Fact Sheet for Healthcare Providers: SeriousBroker.it  This test is not yet approved or cleared by the [place] FDA and has been authorized for detection and/or diagnosis of SARS-CoV-2 by FDA under an Emergency Use Authorization (EUA). This EUA will remain in effect (meaning this test can be used) for the duration of the COVID-19 declaration under Section 564(b)(1) of the Act, 21 U.S.C. section 360bbb-3(b)(1), unless the authorization is terminated or revoked.     Resp Syncytial Virus by PCR NEGATIVE NEGATIVE Final    Comment: (NOTE) Fact Sheet for Patients: BloggerCourse.com  Fact Sheet for Healthcare Providers: SeriousBroker.it  This test is not yet approved or cleared by the [place] FDA and has been authorized for detection and/or diagnosis of SARS-CoV-2 by FDA under an Emergency Use Authorization (EUA). This EUA will remain in effect (meaning this test can be used) for the duration of the COVID-19 declaration under Section 564(b)(1) of the Act, 21 U.S.C. section 360bbb-3(b)(1), unless the authorization is terminated or revoked.  Performed at [facility], [address] [place], [place] [address]   Blood Culture (routine x 2)     Status: None (Preliminary result)   Collection Time: 12/08/22  4:48 PM   Specimen: BLOOD  Result Value Ref Range Status   Specimen Description BLOOD LEFT  ANTECUBITAL  Final   Special Requests   Final    BOTTLES DRAWN AEROBIC AND ANAEROBIC Blood Culture results may not be optimal due to an excessive volume of blood received in culture bottles   Culture   Final    NO GROWTH 3 DAYS Performed at [facility], [address] [place], [place] [address]    Report Status PENDING  Incomplete  Blood Culture (routine x 2)     Status: None (Preliminary result)   Collection Time: 12/08/22  5:01 PM   Specimen: BLOOD RIGHT HAND  Result Value Ref Range Status   Specimen Description BLOOD RIGHT HAND  Final   Special Requests   Final    BOTTLES DRAWN AEROBIC AND ANAEROBIC Blood Culture adequate volume   Culture   Final    NO GROWTH 3 DAYS Performed at [facility], [address], [place], [place] [address]    Report Status PENDING  Incomplete  Radiology Studies: [place] EKG SITE RITE  Result Date: 12/11/2022 If Site Rite image not attached, placement could not be confirmed due to current cardiac rhythm.  US Venous Img Upper Uni Right(DVT)  Result Date: 12/09/2022 CLINICAL DATA:  Right upper extremity swelling. EXAM: RIGHT UPPER EXTREMITY VENOUS DOPPLER ULTRASOUND TECHNIQUE: Gray-scale sonography with graded compression, as well as color Doppler and duplex ultrasound were performed to evaluate the upper extremity deep venous system from the level of the subclavian vein and including the jugular, axillary, basilic, radial, ulnar and upper cephalic vein. Spectral Doppler was utilized to evaluate flow at rest and with distal augmentation maneuvers. COMPARISON:  None Available. FINDINGS: Contralateral Subclavian Vein: Respiratory phasicity is normal and symmetric with the symptomatic side. No evidence of thrombus. Normal compressibility. Internal Jugular Vein: No evidence of thrombus. Normal compressibility, respiratory phasicity and response to augmentation. Subclavian Vein: No evidence of thrombus. Normal compressibility, respiratory  phasicity and response to augmentation. Axillary Vein: No evidence of thrombus. Normal compressibility, respiratory phasicity and response to augmentation. Cephalic Vein: Thrombus extends throughout the cephalic vein with lack of compressibility. Thrombus is occlusive. Basilic Vein: No evidence of thrombus. Normal compressibility, respiratory phasicity and response to augmentation. Brachial Veins: No evidence of thrombus. Normal compressibility, respiratory phasicity and response to augmentation. Radial Veins: No evidence of thrombus. Normal compressibility, respiratory phasicity and response to augmentation. Ulnar Veins: No evidence of thrombus. Normal compressibility, respiratory phasicity and response to augmentation. Venous Reflux:  None visualized. Other Findings: Thrombus in the antecubital cephalic vein at the prior IV site demonstrates an echogenic focus that could reflect a foreign body. It causes posterior acoustic shadowing. IMPRESSION: 1. Occlusive thrombosis noted throughout the cephalic vein. 2. Echogenic focus within the thrombosed cephalic vein in the antecubital fossa, possibly a foreign body. 3. No other evidence of right upper extremity deep venous thrombosis. Electronically Signed   By: Amie Portland M.D.   On: 12/09/2022 15:47        Scheduled Meds:  acidophilus  1 capsule Oral Q12H   apixaban  5 mg Oral BID   vitamin C  500 mg Oral BID   Chlorhexidine Gluconate Cloth  6 each Topical Daily   cholecalciferol  5,000 Units Oral Daily   cyanocobalamin  1,000 mcg Oral Daily   doxycycline  100 mg Oral Q12H   feeding supplement  237 mL Oral TID BM   gabapentin  100 mg Oral Daily   gabapentin  200 mg Oral QHS   loratadine  10 mg Oral Daily   metroNIDAZOLE  500 mg Oral Q12H   midodrine  10 mg Oral TID WC   mirtazapine  7.5 mg Oral QHS   mometasone-formoterol  2 puff Inhalation BID   multivitamin with minerals  1 tablet Oral Daily   pantoprazole  40 mg Oral Daily   risperiDONE  1.5  mg Oral QHS   senna  2 tablet Oral q AM   simvastatin  20 mg Oral QHS   zinc sulfate  220 mg Oral Daily   Continuous Infusions:  ceFEPime (MAXIPIME) IV       LOS: 3 days     Tresa Moore, MD [facility]   If 7PM-7AM, please contact night-coverage  12/11/2022, 12:31 PM

## 2022-12-11 NOTE — Progress Notes (Signed)
Consent obtained from [facility], legal guardian for PICC.

## 2022-12-11 NOTE — Progress Notes (Signed)
Peripherally Inserted Central Catheter Placement  The IV Nurse has discussed with the patient and/or persons authorized to consent for the patient, the purpose of this procedure and the potential benefits and risks involved with this procedure.  The benefits include less needle sticks, lab draws from the catheter, and the patient may be discharged home with the catheter. Risks include, but not limited to, infection, bleeding, blood clot (thrombus formation), and puncture of an artery; nerve damage and irregular heartbeat and possibility to perform a PICC exchange if needed/ordered by physician.  Alternatives to this procedure were also discussed.  Bard Power PICC patient education guide, fact sheet on infection prevention and patient information card has been provided to patient /or left at bedside.  PICC placed by Conrad Capac, RN  PICC Placement Documentation  PICC Single Lumen 12/11/22 Left Cephalic 42 cm 0 cm (Active)  Indication for Insertion or Continuance of Line Prolonged intravenous therapies 12/11/22 1600  Exposed Catheter (cm) 0 cm 12/11/22 1600  Site Assessment Clean, Dry, Intact 12/11/22 1600  Line Status Flushed;Saline locked;Blood return noted 12/11/22 1600  Dressing Type Transparent;Securing device 12/11/22 1600  Dressing Status Antimicrobial disc in place;Clean, Dry, Intact 12/11/22 1600  Safety Lock Not Applicable 12/11/22 1600  Line Care Connections checked and tightened 12/11/22 1600  Line Adjustment (NICU/IV Team Only) No 12/11/22 1600  Dressing Intervention New dressing 12/11/22 1600  Dressing Change Due 12/18/22 12/11/22 1600       Zinia Innocent, Lajean Manes 12/11/2022, 4:01 PM

## 2022-12-11 NOTE — TOC Initial Note (Signed)
Patient is LTC at [facility].  Message sent to Gavin Pound in admissions advising patient will need a NPWT MWF per WOC and IV Cefepime and doxy per pharmacist at discharge.

## 2022-12-12 ENCOUNTER — Inpatient Hospital Stay: Payer: Medicare Other

## 2022-12-12 LAB — GLUCOSE, CAPILLARY
Glucose-Capillary: 111 mg/dL — ABNORMAL HIGH (ref 70–99)
Glucose-Capillary: 126 mg/dL — ABNORMAL HIGH (ref 70–99)
Glucose-Capillary: 126 mg/dL — ABNORMAL HIGH (ref 70–99)

## 2022-12-12 LAB — CULTURE, BLOOD (ROUTINE X 2)
Culture: NO GROWTH
Special Requests: ADEQUATE

## 2022-12-12 LAB — TROPONIN I (HIGH SENSITIVITY): Troponin I (High Sensitivity): 21 ng/L — ABNORMAL HIGH (ref <18)

## 2022-12-12 MED ORDER — DOXYCYCLINE HYCLATE 100 MG PO TABS: 100.0000 mg | ORAL_TABLET | Freq: Two times a day (BID) | ORAL | 1 refills | Status: AC

## 2022-12-12 MED ORDER — HEPARIN SOD (PORK) LOCK FLUSH 100 UNIT/ML IV SOLN
250.0000 [IU] | INTRAVENOUS | Status: AC | PRN
  Administered 2022-12-13: 250 [IU]
  Filled 2022-12-12: qty 5

## 2022-12-12 MED ORDER — DILTIAZEM HCL 25 MG/5ML IV SOLN: 10.0000 mg | INTRAVENOUS | Status: DC | PRN

## 2022-12-12 MED ORDER — ASCORBIC ACID 500 MG PO TABS: 500.0000 mg | ORAL_TABLET | Freq: Two times a day (BID) | ORAL | Status: DC

## 2022-12-12 MED ORDER — ZINC SULFATE 220 (50 ZN) MG PO CAPS: 220.0000 mg | ORAL_CAPSULE | Freq: Every day | ORAL | Status: DC

## 2022-12-12 MED ORDER — FUROSEMIDE 10 MG/ML IJ SOLN
20.0000 mg | Freq: Once | INTRAMUSCULAR | Status: AC
  Administered 2022-12-12: 20 mg via INTRAVENOUS
  Filled 2022-12-12: qty 2

## 2022-12-12 MED ORDER — METRONIDAZOLE 500 MG PO TABS: 500.0000 mg | ORAL_TABLET | Freq: Two times a day (BID) | ORAL | 1 refills | Status: AC

## 2022-12-12 MED ORDER — ORAL CARE MOUTH RINSE
15.0000 mL | OROMUCOSAL | Status: DC
  Administered 2022-12-12 – 2022-12-13 (×6): 15 mL via OROMUCOSAL

## 2022-12-12 MED ORDER — CEFEPIME IV (FOR PTA / DISCHARGE USE ONLY): 2.0000 g | Freq: Three times a day (TID) | INTRAVENOUS | 0 refills | Status: AC

## 2022-12-12 MED ORDER — APIXABAN 5 MG PO TABS: 5.0000 mg | ORAL_TABLET | Freq: Two times a day (BID) | ORAL | Status: DC

## 2022-12-12 MED ORDER — ORAL CARE MOUTH RINSE: 15.0000 mL | OROMUCOSAL | Status: DC | PRN

## 2022-12-12 MED ORDER — METOPROLOL TARTRATE 5 MG/5ML IV SOLN
5.0000 mg | Freq: Once | INTRAVENOUS | Status: AC
  Administered 2022-12-12: 5 mg via INTRAVENOUS
  Filled 2022-12-12: qty 5

## 2022-12-12 MED ORDER — OXYCODONE HCL 5 MG PO TABS: 5.0000 mg | ORAL_TABLET | Freq: Three times a day (TID) | ORAL | 0 refills | Status: AC | PRN

## 2022-12-12 NOTE — Progress Notes (Signed)
Consult received to instill heparin to PICC line for DC. Spoke with nurse at [facility] and she stated that he was not going home. No need for cap and flush for PICC line at this time. Tomasita Morrow, RN VAST

## 2022-12-12 NOTE — Discharge Summary (Signed)
Physician Discharge Summary  Evan [name] EAV:[id number] DOB: 1956-05-06 DOA: 12/08/2022  PCP: Melonie Florida, FNP  Admit date: 12/08/2022 Discharge date: 12/13/2022  Admitted From: SNF Disposition:  SNF  Recommendations for Outpatient Follow-up:  Follow up with PCP in 1-2 weeks Follow up with infectious disease 4-6 weeks  Home Health:No  Equipment/Devices:PICC   Discharge Condition:Stable  CODE STATUS:FULL  Diet recommendation: Reg  Brief/Interim Summary:  67 y.o. male with medical history significant for paranoid schizophrenia, coronary artery disease, non-insulin-dependent diabetes mellitus with complications of diabetic retinopathy, hypertension, obesity who was recently discharged from the hospital following treatment for an infected sacral decubitus ulcer.  He was discharged to the skilled nursing facility with a wound VAC and on antibiotic therapy to treat sacral osteomyelitis. Patient was sent to the emergency room from the skilled nursing facility for evaluation of weakness and hypotension.  He was noted to have systolic blood pressure in the 80s.  He had a Tmax of 100.4 and was tachycardic with heart rate of 120.   CT scan with decubitus ulcer level of sacrum.  WOC consulted.  Surgery made aware.  No appreciable change in the wound.  Wound VAC and dressing changed out.  Infectious disease consulted.  Recommend 4 to 6-week course of IV cefepime and p.o. Flagyl/p.o. doxycycline.  Prescribed and patient will discharge to skilled nursing facility.  Follow-up with infectious disease in 4 to 6 weeks.  Follow-up with general surgery in 2 to 3 weeks.  Follow-up with PCP in 1 to 2 weeks.   Discharge Diagnoses:  Principal Problem:   Sepsis (HCC) Active Problems:   Sacral osteomyelitis (HCC)   COPD, mild (HCC)   Persistent atrial fibrillation (HCC)   CORONARY ATHEROSCLEROSIS, ARTERY BYPASS GRAFT   Diabetes mellitus (HCC)   Paranoid schizophrenia (HCC)  * Sepsis (HCC) Evidenced by  low-grade fever with a Tmax of 99.7, tachycardia, tachypnea and hypotension with systolic blood pressure in the 80s and responded to IV fluid resuscitation Patient also has leukocytosis and a downtrending lactic acid level Source of sepsis appears to be sacral osteomyelitis Patient is status post recent debridement of his sacral decubitus ulcer.  Gram stain yielded few gram-negative rods Plan: Infectious disease consulted.  Antibiotics switched to cefepime and Flagyl.  Also p.o. doxycycline.  PICC line ordered as patient will need a 4-week course.  Stable for return to skilled nursing facility.     Sacral osteomyelitis [facility]) Patient was hospitalized from 4/19 - 4/25 for management of an infected decubitus ulcer He is status postdebridement and had a bone biopsy which was consistent with acute osteomyelitis Plan: WOC consult appreciated.  Surgical team made aware.  Local wound care.  Offloading mattress.  Antibiotics as above.  Surgical follow-up in 2 to 3 weeks  Discharge Instructions  Discharge Instructions     Diet - low sodium heart healthy   Complete by: As directed    Diet - low sodium heart healthy   Complete by: As directed    Discharge wound care:   Complete by: As directed    12/11/22 1057    Negative pressure wound therapy  Every Mon-Wed-Fri 1000    Comments: WOC nursing to change NPWT dressing M/W/F 1/2 ostomy barrier ring being used between distal aspect of wound edge and anus to aid in maintaining seal Question Answer Comment Amount of suction? 125 mm/Hg   Suction Type? Continuous     12/11/22 1056    12/11/22 1056    Wound care  Every shift  Comments: IF WOUND VAC DRESSING LOSING SEAL (STOOL OR OTHERWISE) Saline moist gauze packing BID, top with ABD pads, secure with tape or mesh/stretch underwear. Change BID until surgical evaluation.  12/11/22 1056   12/11/22 1000    Negative pressure wound therapy  Every Mon-Wed-Fri 1000   Discharge wound care:   Complete  by: As directed    See discharge summary   Home infusion instructions   Complete by: As directed    Instructions: Flushing of vascular access device: 0.9% NaCl pre/post medication administration and prn patency; Heparin 100 u/ml, 5ml for implanted ports and Heparin 10u/ml, 5ml for all other central venous catheters.   Increase activity slowly   Complete by: As directed    Increase activity slowly   Complete by: As directed       Allergies as of 12/13/2022       Reactions   Penicillin G Hives   Childhood reaction   Sulfa Antibiotics Hives   Tiotropium    Zoloft [sertraline Hcl] Other (See Comments)        Medication List     STOP taking these medications    cephALEXin 500 MG capsule Commonly known as: KEFLEX   furosemide 20 MG tablet Commonly known as: LASIX   HYDROcodone-acetaminophen 5-325 MG tablet Commonly known as: NORCO/VICODIN       TAKE these medications    acetaminophen 500 MG tablet Commonly known as: TYLENOL Take 500 mg by mouth every 8 (eight) hours as needed for mild pain.   albuterol 108 (90 Base) MCG/ACT inhaler Commonly known as: VENTOLIN HFA Inhale 2 puffs into the lungs every 6 (six) hours as needed for wheezing or shortness of breath.   apixaban 5 MG Tabs tablet Commonly known as: ELIQUIS Take 1 tablet (5 mg total) by mouth 2 (two) times daily.   ascorbic acid 500 MG tablet Commonly known as: VITAMIN C Take 1 tablet (500 mg total) by mouth 2 (two) times daily.   Bacid Caps Take 1 tablet by mouth every 12 (twelve) hours.   ceFEPime  IVPB Commonly known as: MAXIPIME Inject 2 g into the vein every 8 (eight) hours. Indication:  Sacral wound/osteomyelitis Last Day of Therapy:  01/19/2023 Labs - Once weekly:  CBC/D, CMP, ESR and CRP Please pull PIC at completion of IV antibiotics Fax weekly labs to [phone] Method of administration: IV Push Method of administration may be changed at the discretion of facility/pharmacy    cetirizine 10 MG tablet Commonly known as: ZYRTEC Take 10 mg by mouth at bedtime.   cyanocobalamin 1000 MCG tablet Commonly known as: VITAMIN B12 Take 1 tablet (1,000 mcg total) by mouth daily.   diltiazem 120 MG 24 hr capsule Commonly known as: CARDIZEM CD Take 1 capsule (120 mg total) by mouth daily. Start taking on: Dec 14, 2022   doxycycline 100 MG tablet Commonly known as: VIBRA-TABS Take 1 tablet (100 mg total) by mouth every 12 (twelve) hours for 79 doses.   empagliflozin 10 MG Tabs tablet Commonly known as: Jardiance Take 1 tablet (10 mg total) by mouth daily.   feeding supplement Liqd Take 237 mLs by mouth 2 (two) times daily between meals.   gabapentin 100 MG capsule Commonly known as: NEURONTIN Take 100 mg by mouth daily.   gabapentin 100 MG capsule Commonly known as: NEURONTIN Take 200 mg by mouth at bedtime.   magnesium hydroxide 400 MG/5ML suspension Commonly known as: MILK OF MAGNESIA Take 5 mLs by mouth daily as needed  for mild constipation.   melatonin 3 MG Tabs tablet Take 3 mg by mouth at bedtime. (Take with 5mg  tablet to equal 8mg  total) What changed: Another medication with the same name was changed. Make sure you understand how and when to take each.   melatonin 5 MG Tabs Take 1 tablet (5 mg total) by mouth at bedtime as needed. What changed:  when to take this additional instructions   metroNIDAZOLE 500 MG tablet Commonly known as: FLAGYL Take 1 tablet (500 mg total) by mouth every 12 (twelve) hours for 80 doses. What changed: when to take this   midodrine 5 MG tablet Commonly known as: PROAMATINE Take 1 tablet (5 mg total) by mouth 3 (three) times daily with meals.   mirtazapine 7.5 MG tablet Commonly known as: REMERON Take 7.5 mg by mouth at bedtime.   multivitamin with minerals Tabs tablet Take 1 tablet by mouth daily.   nitroGLYCERIN 0.4 MG SL tablet Commonly known as: NITROSTAT Place 1 tablet (0.4 mg total) under the tongue  every 5 (five) minutes as needed for chest pain.   oxyCODONE 5 MG immediate release tablet Commonly known as: Oxy IR/ROXICODONE Take 1 tablet (5 mg total) by mouth every 8 (eight) hours as needed for up to 3 days for severe pain or moderate pain. SNF use only.  Refills per SNF MD What changed: additional instructions   pantoprazole 40 MG tablet Commonly known as: PROTONIX Take 1 tablet (40 mg total) by mouth daily.   risperiDONE 3 MG tablet Commonly known as: RISPERDAL Take 1.5 mg by mouth at bedtime.   senna 8.6 MG Tabs tablet Commonly known as: SENOKOT Take 2 tablets by mouth in the morning.   simvastatin 20 MG tablet Commonly known as: ZOCOR Take 20 mg by mouth every evening.   Vitamin D-3 125 MCG (5000 UT) Tabs Take 5,000 Units by mouth daily.   Wixela Inhub 100-50 MCG/ACT Aepb Generic drug: fluticasone-salmeterol Inhale 1 puff into the lungs 2 (two) times daily.   zinc sulfate 220 (50 Zn) MG capsule Take 1 capsule (220 mg total) by mouth daily.               Home Infusion Instuctions  (From admission, onward)           Start     Ordered   12/12/22 0000  Home infusion instructions       Question:  Instructions  Answer:  Flushing of vascular access device: 0.9% NaCl pre/post medication administration and prn patency; Heparin 100 u/ml, 5ml for implanted ports and Heparin 10u/ml, 5ml for all other central venous catheters.   12/12/22 1400              Discharge Care Instructions  (From admission, onward)           Start     Ordered   12/13/22 0000  Discharge wound care:       Comments: See discharge summary   12/13/22 1314   12/12/22 0000  Discharge wound care:       Comments: 12/11/22 1057    Negative pressure wound therapy  Every Mon-Wed-Fri 1000    Comments: WOC nursing to change NPWT dressing M/W/F 1/2 ostomy barrier ring being used between distal aspect of wound edge and anus to aid in maintaining seal Question Answer Comment Amount  of suction? 125 mm/Hg   Suction Type? Continuous     12/11/22 1056    12/11/22 1056    Wound care  Every shift      Comments: IF WOUND VAC DRESSING LOSING SEAL (STOOL OR OTHERWISE) Saline moist gauze packing BID, top with ABD pads, secure with tape or mesh/stretch underwear. Change BID until surgical evaluation.  12/11/22 1056   12/11/22 1000    Negative pressure wound therapy  Every Mon-Wed-Fri 1000   12/12/22 1400            Contact information for follow-up providers     Melonie Florida, FNP. Schedule an appointment as soon as possible for a visit in 1 week(s).   Specialty: Family Medicine Contact information: [address] [place] [address] [phone]         Lynn Ito, MD. Schedule an appointment as soon as possible for a visit today.   Specialty: Infectious Diseases Why: Follow up in 4-6 weeks Contact information: [address] [place] [address] [phone]         Henrene Dodge, MD Follow up in 3 week(s).   Specialty: General Surgery Contact information: [address] [place] [address] [phone]              Contact information for after-discharge care     Destination     [facility] .   Service: Skilled Nursing Contact information: [address] [place] [address] [phone]                    Allergies  Allergen Reactions   Penicillin G Hives    Childhood reaction   Sulfa Antibiotics Hives   Tiotropium    Zoloft [Sertraline Hcl] Other (See Comments)    Consultations: WOC ID   Procedures/Studies: DG Chest Port 1 View  Result Date: 12/12/2022 CLINICAL DATA:  Chest pain, sepsis, weakness EXAM: PORTABLE CHEST 1 VIEW COMPARISON:  Portable exam 1652 hours compared to 12/11/2022 FINDINGS: LEFT arm PICC line tip projects over cavoatrial junction. Enlargement of cardiac silhouette with pulmonary vascular  congestion post CABG. Minimal RIGHT basilar atelectasis. Lungs otherwise clear. No infiltrate, pleural effusion, or pneumothorax. IMPRESSION: Enlargement of cardiac silhouette with pulmonary vascular congestion post CABG. Minimal RIGHT basilar atelectasis. Electronically Signed   By: Ulyses Southward M.D.   On: 12/12/2022 17:27   US Venous Img Lower Unilateral Left (DVT)  Result Date: 12/12/2022 CLINICAL DATA:  Asymmetric edema of the left lower extremity. EXAM: LEFT LOWER EXTREMITY VENOUS DOPPLER ULTRASOUND TECHNIQUE: Gray-scale sonography with graded compression, as well as color Doppler and duplex ultrasound were performed to evaluate the lower extremity deep venous systems from the level of the common femoral vein and including the common femoral, femoral, profunda femoral, popliteal and calf veins including the posterior tibial, peroneal and gastrocnemius veins when visible. The superficial great saphenous vein was also interrogated. Spectral Doppler was utilized to evaluate flow at rest and with distal augmentation maneuvers in the common femoral, femoral and popliteal veins. COMPARISON:  None Available. FINDINGS: Contralateral Common Femoral Vein: Respiratory phasicity is normal and symmetric with the symptomatic side. No evidence of thrombus. Normal compressibility. Common Femoral Vein: No evidence of thrombus. Normal compressibility, respiratory phasicity and response to augmentation. Saphenofemoral Junction: No evidence of thrombus. Normal compressibility and flow on color Doppler imaging. Profunda Femoral Vein: No evidence of thrombus. Normal compressibility and flow on color Doppler imaging. Femoral Vein: No evidence of thrombus. Normal compressibility, respiratory phasicity and response to augmentation. Popliteal Vein: No evidence of thrombus. Normal compressibility, respiratory phasicity and response to augmentation. Calf  Veins: No evidence of thrombus. Normal compressibility and flow on color Doppler  imaging. Superficial Great Saphenous Vein: No evidence of thrombus. Normal compressibility. Venous Reflux:  None. Other Findings: No evidence of superficial thrombophlebitis or abnormal fluid collection. IMPRESSION: No evidence of left lower extremity deep venous thrombosis. Electronically Signed   By: Irish Lack M.D.   On: 12/12/2022 11:51   DG Chest Port 1 View  Result Date: 12/11/2022 CLINICAL DATA:  PICC placement. EXAM: PORTABLE CHEST 1 VIEW COMPARISON:  Chest CT and radiograph 12/08/2022 FINDINGS: Tip of the left upper extremity PICC overlies the atrial caval junction. Patient is post median sternotomy and CABG. Stable cardiomegaly. Similar right lung volume loss with right pleural effusion. Left pleural effusion was better assessed on prior CT. Increase in vascular congestion. No pneumothorax. IMPRESSION: 1. Tip of the left upper extremity PICC overlies the atrial caval junction. 2. Stable cardiomegaly. Similar right lung volume loss and right pleural effusion. Left pleural effusion better assessed on prior CT. Electronically Signed   By: Narda Rutherford M.D.   On: 12/11/2022 17:26   [place] EKG SITE RITE  Result Date: 12/11/2022 If Site Rite image not attached, placement could not be confirmed due to current cardiac rhythm.  US Venous Img Upper Uni Right(DVT)  Result Date: 12/09/2022 CLINICAL DATA:  Right upper extremity swelling. EXAM: RIGHT UPPER EXTREMITY VENOUS DOPPLER ULTRASOUND TECHNIQUE: Gray-scale sonography with graded compression, as well as color Doppler and duplex ultrasound were performed to evaluate the upper extremity deep venous system from the level of the subclavian vein and including the jugular, axillary, basilic, radial, ulnar and upper cephalic vein. Spectral Doppler was utilized to evaluate flow at rest and with distal augmentation maneuvers. COMPARISON:  None Available. FINDINGS: Contralateral Subclavian Vein: Respiratory phasicity is normal and symmetric with the  symptomatic side. No evidence of thrombus. Normal compressibility. Internal Jugular Vein: No evidence of thrombus. Normal compressibility, respiratory phasicity and response to augmentation. Subclavian Vein: No evidence of thrombus. Normal compressibility, respiratory phasicity and response to augmentation. Axillary Vein: No evidence of thrombus. Normal compressibility, respiratory phasicity and response to augmentation. Cephalic Vein: Thrombus extends throughout the cephalic vein with lack of compressibility. Thrombus is occlusive. Basilic Vein: No evidence of thrombus. Normal compressibility, respiratory phasicity and response to augmentation. Brachial Veins: No evidence of thrombus. Normal compressibility, respiratory phasicity and response to augmentation. Radial Veins: No evidence of thrombus. Normal compressibility, respiratory phasicity and response to augmentation. Ulnar Veins: No evidence of thrombus. Normal compressibility, respiratory phasicity and response to augmentation. Venous Reflux:  None visualized. Other Findings: Thrombus in the antecubital cephalic vein at the prior IV site demonstrates an echogenic focus that could reflect a foreign body. It causes posterior acoustic shadowing. IMPRESSION: 1. Occlusive thrombosis noted throughout the cephalic vein. 2. Echogenic focus within the thrombosed cephalic vein in the antecubital fossa, possibly a foreign body. 3. No other evidence of right upper extremity deep venous thrombosis. Electronically Signed   By: Amie Portland M.D.   On: 12/09/2022 15:47   CT CHEST ABDOMEN PELVIS W CONTRAST  Result Date: 12/08/2022 CLINICAL DATA:  Sepsis EXAM: CT CHEST, ABDOMEN, AND PELVIS WITH CONTRAST TECHNIQUE: Multidetector CT imaging of the chest, abdomen and pelvis was performed following the standard protocol during bolus administration of intravenous contrast. RADIATION DOSE REDUCTION: This exam was performed according to the departmental dose-optimization program  which includes automated exposure control, adjustment of the mA and/or kV according to patient size and/or use of iterative reconstruction technique. CONTRAST:  OMNIPAQUE  IOHEXOL 300 MG/ML  SOLN COMPARISON:  Chest x-ray 12/08/2022, CT angio chest 08/27/2014 FINDINGS: CT CHEST FINDINGS Cardiovascular: Prominent sized heart size. No significant pericardial effusion. The thoracic aorta is normal in caliber. Severe atherosclerotic plaque of the thoracic aorta including aortic valve leaflet calcifications. Four-vessel coronary artery calcifications status post coronary artery bypass graft. Mediastinum/Nodes: No enlarged mediastinal, hilar, or axillary lymph nodes. Thyroid gland, trachea, and esophagus demonstrate no significant findings. Lungs/Pleura: No focal consolidation. No pulmonary nodule. No pulmonary mass. Trace to small right and trace left pleural effusions. No pneumothorax. Musculoskeletal: No chest wall abnormality. No suspicious lytic or blastic osseous lesions. No acute displaced fracture. Similar-appearing vertebral body hemangioma of the T9 vertebral level. Multilevel degenerative changes of the spine. CT ABDOMEN PELVIS FINDINGS Hepatobiliary: No focal liver abnormality. Calcified gallstone noted within the gallbladder lumen. No gallbladder wall thickening or pericholecystic fluid. No biliary dilatation. Pancreas: Slight hazy contour of the uncinate process of the pancreas. No focal lesion. No main pancreatic ductal dilatation. Spleen: Normal in size without focal abnormality. Adrenals/Urinary Tract: No adrenal nodule bilaterally. Right nephrolithiasis measuring up to 3 mm. No left nephrolithiasis. No ureterolithiasis bilaterally. No hydroureteronephrosis. Urinary bladder is decompressed with circumferential urinary bladder wall thickening and mild perivesicular fat stranding. Foley catheter tip and balloon terminate within the urinary bladder lumen. Stomach/Bowel: Stomach is within normal limits.  No evidence of bowel wall thickening or dilatation. Stool throughout the colon. Appendix appears normal. Vascular/Lymphatic: No abdominal aorta or iliac aneurysm. Severe atherosclerotic plaque of the aorta and its branches. No abdominal, pelvic, or inguinal lymphadenopathy. Reproductive: Prostate is unremarkable. Other: Trace fat stranding and free fluid noted along the uncinate process of the pancreas as well as third portion of the duodenum. Trace free fluid courses into the pelvis and lower abdomen. No intraperitoneal free gas. No organized fluid collection. Musculoskeletal: Small fat containing umbilical hernia. Diffuse subcutaneus soft tissue edema. Decubitus ulcer at the level of the sacrum. No organized fluid collection. Associated cortical erosion of the distal most S5 level and coccyx. No suspicious lytic or blastic osseous lesions. No acute displaced fracture. Multilevel degenerative changes of the spine. IMPRESSION: 1. Decubitus ulcer at the level of the sacrum. Associated cortical erosion of the distal most S5 level and coccyx suggestive of osteomyelitis. Recommend correlation with MRI (with intravenous contrast if GFR greater than 30). 2. Trace to small right and trace left pleural effusions. 3. Question acute pancreatitis-correlate with lipase levels. Likely reactive changes of the duodenum. 4. Cholelithiasis no CT finding of acute cholecystitis. If clinically indicated, consider right upper quadrant ultrasound for further evaluation. 5. Decompressed urinary bladder with associated wall thickening and trace perivesicular fat stranding. Consider correlation with urinalysis to exclude infection in the setting of a Foley catheter in appropriate position. 6. Diffuse subcutaneus soft tissue edema along the abdomen and pelvis. 7. Aortic Atherosclerosis (ICD10-I70.0) including four-vessel coronary artery and aortic valve leaflet calcifications-correlate for aortic stenosis. Status post coronary artery bypass  graft. Electronically Signed   By: Tish Frederickson M.D.   On: 12/08/2022 18:17   DG Chest Port 1 View  Result Date: 12/08/2022 CLINICAL DATA:  Questionable sepsis EXAM: PORTABLE CHEST 1 VIEW COMPARISON:  Chest x-ray 11/24/2022 FINDINGS: The heart is enlarged. There are atelectatic changes in the right lower lung. There central pulmonary vascular congestion. There is no pleural effusion or pneumothorax. No acute fractures are seen. IMPRESSION: 1. Cardiomegaly with central pulmonary vascular congestion. 2. Atelectatic changes in the right lower lung. Electronically Signed   By: Linton Rump  Malena Edman M.D.   On: 12/08/2022 17:14   DG Chest 2 View  Result Date: 11/24/2022 CLINICAL DATA:  Cough EXAM: CHEST - 2 VIEW COMPARISON:  Chest x-ray dated September 27, 2022 FINDINGS: Patient is rotated to the right which somewhat limits evaluation. Cardiac and mediastinal contours are unchanged post median sternotomy. Elevation of the right hemidiaphragm. Mild bibasilar opacities which are likely due to atelectasis. No evidence of pneumothorax. Trace bilateral pleural effusions. IMPRESSION: 1. Mild bibasilar opacities which are likely due to atelectasis. 2. Trace bilateral pleural effusions. Electronically Signed   By: Allegra Lai M.D.   On: 11/24/2022 11:41      Subjective: Seen and examined on the day of discharge.  Stable no distress.  Concerns seem to be more financial in nature.  No medical issues requiring continued hospitalization.  Discharge Exam: Vitals:   12/13/22 0508 12/13/22 0753  BP: 92/66 100/70  Pulse:  95  Resp: 17 16  Temp:  98.3 F (36.8 C)  SpO2: 97% 100%   Vitals:   12/13/22 0412 12/13/22 0416 12/13/22 0508 12/13/22 0753  BP:  92/61 92/66 100/70  Pulse:  96  95  Resp:  20 17 16   Temp:  98.8 F (37.1 C)  98.3 F (36.8 C)  TempSrc:      SpO2:  98% 97% 100%  Weight: 95.5 kg     Height:        General: Pt is alert, awake, not in acute distress Cardiovascular: RRR, S1/S2 +, no  rubs, no gallops Respiratory: CTA bilaterally, no wheezing, no rhonchi Abdominal: Soft, NT, ND, bowel sounds + Extremities: no edema, no cyanosis    The results of significant diagnostics from this hospitalization (including imaging, microbiology, ancillary and laboratory) are listed below for reference.     Microbiology: Recent Results (from the past 240 hour(s))  Resp panel by RT-PCR (RSV, Flu A&B, Covid) Anterior Nasal Swab     Status: None   Collection Time: 12/08/22  4:48 PM   Specimen: Anterior Nasal Swab  Result Value Ref Range Status   SARS Coronavirus 2 by RT PCR NEGATIVE NEGATIVE Final    Comment: (NOTE) SARS-CoV-2 target nucleic acids are NOT DETECTED.  The SARS-CoV-2 RNA is generally detectable in upper respiratory specimens during the acute phase of infection. The lowest concentration of SARS-CoV-2 viral copies this assay can detect is 138 copies/mL. A negative result does not preclude SARS-Cov-2 infection and should not be used as the sole basis for treatment or other patient management decisions. A negative result may occur with  improper specimen collection/handling, submission of specimen other than nasopharyngeal swab, presence of viral mutation(s) within the areas targeted by this assay, and inadequate number of viral copies(<138 copies/mL). A negative result must be combined with clinical observations, patient history, and epidemiological information. The expected result is Negative.  Fact Sheet for Patients:  BloggerCourse.com  Fact Sheet for Healthcare Providers:  SeriousBroker.it  This test is no t yet approved or cleared by the [place] FDA and  has been authorized for detection and/or diagnosis of SARS-CoV-2 by FDA under an Emergency Use Authorization (EUA). This EUA will remain  in effect (meaning this test can be used) for the duration of the COVID-19 declaration under Section 564(b)(1) of the  Act, 21 U.S.C.section 360bbb-3(b)(1), unless the authorization is terminated  or revoked sooner.       Influenza A by PCR NEGATIVE NEGATIVE Final   Influenza B by PCR NEGATIVE NEGATIVE Final    Comment: (NOTE)  The Xpert Xpress SARS-CoV-2/FLU/RSV plus assay is intended as an aid in the diagnosis of influenza from Nasopharyngeal swab specimens and should not be used as a sole basis for treatment. Nasal washings and aspirates are unacceptable for Xpert Xpress SARS-CoV-2/FLU/RSV testing.  Fact Sheet for Patients: BloggerCourse.com  Fact Sheet for Healthcare Providers: SeriousBroker.it  This test is not yet approved or cleared by the [place] FDA and has been authorized for detection and/or diagnosis of SARS-CoV-2 by FDA under an Emergency Use Authorization (EUA). This EUA will remain in effect (meaning this test can be used) for the duration of the COVID-19 declaration under Section 564(b)(1) of the Act, 21 U.S.C. section 360bbb-3(b)(1), unless the authorization is terminated or revoked.     Resp Syncytial Virus by PCR NEGATIVE NEGATIVE Final    Comment: (NOTE) Fact Sheet for Patients: BloggerCourse.com  Fact Sheet for Healthcare Providers: SeriousBroker.it  This test is not yet approved or cleared by the [place] FDA and has been authorized for detection and/or diagnosis of SARS-CoV-2 by FDA under an Emergency Use Authorization (EUA). This EUA will remain in effect (meaning this test can be used) for the duration of the COVID-19 declaration under Section 564(b)(1) of the Act, 21 U.S.C. section 360bbb-3(b)(1), unless the authorization is terminated or revoked.  Performed at [facility], [address] [place], [place] [address]   Blood Culture (routine x 2)     Status: None   Collection Time: 12/08/22  4:48 PM   Specimen: BLOOD  Result Value Ref Range  Status   Specimen Description BLOOD LEFT ANTECUBITAL  Final   Special Requests   Final    BOTTLES DRAWN AEROBIC AND ANAEROBIC Blood Culture results may not be optimal due to an excessive volume of blood received in culture bottles   Culture   Final    NO GROWTH 5 DAYS Performed at [facility], [address] [place], [place] [address]    Report Status 12/13/2022 FINAL  Final  Blood Culture (routine x 2)     Status: None   Collection Time: 12/08/22  5:01 PM   Specimen: BLOOD RIGHT HAND  Result Value Ref Range Status   Specimen Description BLOOD RIGHT HAND  Final   Special Requests   Final    BOTTLES DRAWN AEROBIC AND ANAEROBIC Blood Culture adequate volume   Culture   Final    NO GROWTH 5 DAYS Performed at [facility], [address] [place], [place] [address]    Report Status 12/13/2022 FINAL  Final     Labs: BNP (last 3 results) Recent Labs    09/27/22 1138 09/28/22 0620 12/11/22 0751  BNP 428.7* 988.5* 712.5*   Basic Metabolic Panel: Recent Labs  Lab 12/08/22 1648 12/09/22 0523 12/10/22 0357 12/10/22 0809 12/11/22 0751  NA 137 138  --  138 141  K 3.5 3.3*  --  3.3* 3.7  CL 108 110  --  110 111  CO2 22 21*  --  24 24  GLUCOSE 156* 106*  --  91 94  BUN 7* 5*  --  7* 7*  CREATININE 0.67 0.57* 0.56* 0.55* 0.54*  CALCIUM 8.0* 7.5*  --  7.6* 8.1*  MG  --   --   --   --  1.8   Liver Function Tests: Recent Labs  Lab 12/08/22 1648  AST 23  ALT 11  ALKPHOS 99  BILITOT 0.5  PROT 5.3*  ALBUMIN 2.1*   Recent Labs  Lab 12/08/22  1649  LIPASE 34   No results for input(s): "AMMONIA" in the last 168 hours. CBC: Recent Labs  Lab 12/08/22 1648 12/09/22 0630 12/10/22 0809 12/11/22 0751  WBC 12.7* 14.6* 8.9 8.6  NEUTROABS 9.6*  --  5.9 5.9  HGB 8.6* 8.1* 7.7* 8.8*  HCT 29.0* 27.7* 25.9* 30.0*  MCV 91.5 93.9 91.8 92.3  PLT 432* 425* 398 414*   Cardiac Enzymes: No results for input(s): "CKTOTAL", "CKMB", "CKMBINDEX", "TROPONINI" in  the last 168 hours. BNP: Invalid input(s): "POCBNP" CBG: Recent Labs  Lab 12/11/22 2015 12/12/22 0817 12/12/22 1247 12/12/22 2033 12/13/22 0754  GLUCAP 119* 111* 126* 126* 107*   D-Dimer No results for input(s): "DDIMER" in the last 72 hours. Hgb A1c No results for input(s): "HGBA1C" in the last 72 hours. Lipid Profile No results for input(s): "CHOL", "HDL", "LDLCALC", "TRIG", "CHOLHDL", "LDLDIRECT" in the last 72 hours. Thyroid function studies No results for input(s): "TSH", "T4TOTAL", "T3FREE", "THYROIDAB" in the last 72 hours.  Invalid input(s): "FREET3" Anemia work up No results for input(s): "VITAMINB12", "FOLATE", "FERRITIN", "TIBC", "IRON", "RETICCTPCT" in the last 72 hours. Urinalysis    Component Value Date/Time   COLORURINE YELLOW (A) 12/08/2022 1648   APPEARANCEUR CLEAR (A) 12/08/2022 1648   APPEARANCEUR Clear 09/20/2021 0856   LABSPEC 1.014 12/08/2022 1648   LABSPEC 1.013 11/29/2014 1120   PHURINE 8.0 12/08/2022 1648   GLUCOSEU >=500 (A) 12/08/2022 1648   GLUCOSEU Negative 11/29/2014 1120   HGBUR MODERATE (A) 12/08/2022 1648   BILIRUBINUR NEGATIVE 12/08/2022 1648   BILIRUBINUR Negative 09/20/2021 0856   BILIRUBINUR Negative 11/29/2014 1120   KETONESUR NEGATIVE 12/08/2022 1648   PROTEINUR 30 (A) 12/08/2022 1648   NITRITE NEGATIVE 12/08/2022 1648   LEUKOCYTESUR NEGATIVE 12/08/2022 1648   LEUKOCYTESUR Negative 11/29/2014 1120   Sepsis Labs Recent Labs  Lab 12/08/22 1648 12/09/22 0630 12/10/22 0809 12/11/22 0751  WBC 12.7* 14.6* 8.9 8.6   Microbiology Recent Results (from the past 240 hour(s))  Resp panel by RT-PCR (RSV, Flu A&B, Covid) Anterior Nasal Swab     Status: None   Collection Time: 12/08/22  4:48 PM   Specimen: Anterior Nasal Swab  Result Value Ref Range Status   SARS Coronavirus 2 by RT PCR NEGATIVE NEGATIVE Final    Comment: (NOTE) SARS-CoV-2 target nucleic acids are NOT DETECTED.  The SARS-CoV-2 RNA is generally detectable in  upper respiratory specimens during the acute phase of infection. The lowest concentration of SARS-CoV-2 viral copies this assay can detect is 138 copies/mL. A negative result does not preclude SARS-Cov-2 infection and should not be used as the sole basis for treatment or other patient management decisions. A negative result may occur with  improper specimen collection/handling, submission of specimen other than nasopharyngeal swab, presence of viral mutation(s) within the areas targeted by this assay, and inadequate number of viral copies(<138 copies/mL). A negative result must be combined with clinical observations, patient history, and epidemiological information. The expected result is Negative.  Fact Sheet for Patients:  BloggerCourse.com  Fact Sheet for Healthcare Providers:  SeriousBroker.it  This test is no t yet approved or cleared by the [place] FDA and  has been authorized for detection and/or diagnosis of SARS-CoV-2 by FDA under an Emergency Use Authorization (EUA). This EUA will remain  in effect (meaning this test can be used) for the duration of the COVID-19 declaration under Section 564(b)(1) of the Act, 21 U.S.C.section 360bbb-3(b)(1), unless the authorization is terminated  or revoked sooner.  Influenza A by PCR NEGATIVE NEGATIVE Final   Influenza B by PCR NEGATIVE NEGATIVE Final    Comment: (NOTE) The Xpert Xpress SARS-CoV-2/FLU/RSV plus assay is intended as an aid in the diagnosis of influenza from Nasopharyngeal swab specimens and should not be used as a sole basis for treatment. Nasal washings and aspirates are unacceptable for Xpert Xpress SARS-CoV-2/FLU/RSV testing.  Fact Sheet for Patients: BloggerCourse.com  Fact Sheet for Healthcare Providers: SeriousBroker.it  This test is not yet approved or cleared by the [place] FDA and has been  authorized for detection and/or diagnosis of SARS-CoV-2 by FDA under an Emergency Use Authorization (EUA). This EUA will remain in effect (meaning this test can be used) for the duration of the COVID-19 declaration under Section 564(b)(1) of the Act, 21 U.S.C. section 360bbb-3(b)(1), unless the authorization is terminated or revoked.     Resp Syncytial Virus by PCR NEGATIVE NEGATIVE Final    Comment: (NOTE) Fact Sheet for Patients: BloggerCourse.com  Fact Sheet for Healthcare Providers: SeriousBroker.it  This test is not yet approved or cleared by the [place] FDA and has been authorized for detection and/or diagnosis of SARS-CoV-2 by FDA under an Emergency Use Authorization (EUA). This EUA will remain in effect (meaning this test can be used) for the duration of the COVID-19 declaration under Section 564(b)(1) of the Act, 21 U.S.C. section 360bbb-3(b)(1), unless the authorization is terminated or revoked.  Performed at [facility], [address] [place], [place] [address]   Blood Culture (routine x 2)     Status: None   Collection Time: 12/08/22  4:48 PM   Specimen: BLOOD  Result Value Ref Range Status   Specimen Description BLOOD LEFT ANTECUBITAL  Final   Special Requests   Final    BOTTLES DRAWN AEROBIC AND ANAEROBIC Blood Culture results may not be optimal due to an excessive volume of blood received in culture bottles   Culture   Final    NO GROWTH 5 DAYS Performed at [facility], [address], [place], [place] [address]    Report Status 12/13/2022 FINAL  Final  Blood Culture (routine x 2)     Status: None   Collection Time: 12/08/22  5:01 PM   Specimen: BLOOD RIGHT HAND  Result Value Ref Range Status   Specimen Description BLOOD RIGHT HAND  Final   Special Requests   Final    BOTTLES DRAWN AEROBIC AND ANAEROBIC Blood Culture adequate volume   Culture   Final    NO GROWTH 5  DAYS Performed at [facility], [address] [place], [place] [address]    Report Status 12/13/2022 FINAL  Final     Time coordinating discharge: Over 30 minutes  SIGNED:   Lucile Shutters, MD  [facility] 12/13/2022, 1:15 PM Pager   If 7PM-7AM, please contact night-coverage

## 2022-12-12 NOTE — Significant Event (Addendum)
Notified by RN.  Patient with sustained sinus tachycardia HR 140 Responded to IVP metop 5mg  After improvement in HR patient endorsed CP up to neck and loss of feeling in BL LE  Plan: Hold discharge STAT troponin STAT 12 lead EKG STAT CXR  Patient had similar event occur yesterday.  Resolved spontaneously.   Will hold discharge and monitor Re-evaluate for discharge appropriateness in AM  Lolita Patella MD  No charge  EKG performed shows likely new onset afib.  Possibly precipitated by PICC placement?  Appropriate placement confirmed on CXR 5/6.  Repeat CXR pending.  Trop pending.  Repeat echo ordered given rhythm change.  Currently HR 110-120.  Consider cardiology evaluation in AM

## 2022-12-12 NOTE — [place] FL2 (Signed)
[place] MEDICAID FL2 LEVEL OF CARE FORM     IDENTIFICATION  Patient Name: Evan [name] Birthdate: Jul 20, 1956 Sex: male Admission Date (Current Location): 12/08/2022  [facility] and [place] Number:  Chiropodist and Address:  [facility], [address] [place], [place] [address]      Provider Number:    Attending Physician Name and Address:  Tresa Moore, MD  Relative Name and Phone Number:  Allen-Byrd,Sentrell (Legal Guardian) [phone] (Mobile)    Current Level of Care: Hospital Recommended Level of Care: Skilled Nursing Facility Prior Approval Number:    Date Approved/Denied:   PASRR Number: [id number]  Discharge Plan: SNF    Current Diagnoses: Patient Active Problem List   Diagnosis Date Noted   Sacral osteomyelitis (HCC) 12/08/2022   Pyogenic inflammation of bone (HCC) 11/29/2022   Acute blood loss anemia 11/28/2022   Metabolic acidosis 11/26/2022   Sacral decubitus ulcer, stage IV (HCC) 11/25/2022   Sepsis (HCC) 11/24/2022   History of hypotension 11/24/2022   Tremors of nervous system 11/24/2022   Chronic pain 11/24/2022   Sacral decubitus ulcer 11/24/2022   Left ventricular hypokinesis inferolateral wall 09/28/2022   Moderate aortic stenosis 09/28/2022   Shock circulatory (HCC) 09/27/2022   Elevated lactic acid level 09/27/2022   SIRS (systemic inflammatory response syndrome) (HCC) 09/26/2022   Leukocytosis 09/26/2022   AKI (acute kidney injury) (HCC) 09/26/2022   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   Elevated ferritin level 11/11/2021   Memory changes 08/12/2021   Other thrombophilia (HCC) 12/25/2020   Vitamin D deficiency 12/04/2020   Vitamin B12 deficiency 12/04/2020   Stenosis of right carotid artery 12/03/2020   History of CVA (cerebrovascular accident) 11/06/2020   History of 2019 novel coronavirus disease (COVID-19) 09/27/2020   Atherosclerosis of aorta (HCC) 12/04/2019   Persistent  atrial fibrillation (HCC) 09/12/2019   CAD (coronary artery disease) 09/12/2019   Chronic venous stasis 09/12/2019   Hoarding disorder 09/12/2019   Cervical spinal stenosis 09/12/2019   Diabetic retinopathy (HCC) 09/12/2019   COPD, mild (HCC) 09/12/2019   Osteoporosis 09/09/2019   History of prostate cancer 09/09/2019   Paranoid schizophrenia (HCC) 08/10/2019   Obstructive sleep apnea on CPAP 08/10/2019   Hyperlipidemia associated with type 2 diabetes mellitus (HCC) 08/10/2019   Diabetes mellitus (HCC) 04/13/2015   Chronic diastolic CHF (congestive heart failure) (HCC) 01/09/2014   Esophageal dysmotility 09/12/2013   Type 2 diabetes mellitus with morbid obesity (HCC) 02/14/2012   Obesity, morbid (more than 100 lbs over ideal weight or BMI > 40) (HCC) 07/26/2011   Old myocardial infarction 03/02/2010   Hypertension associated with diabetes (HCC) 03/20/2009   CORONARY ATHEROSCLEROSIS, ARTERY BYPASS GRAFT 03/20/2009    Orientation RESPIRATION BLADDER Height & Weight     Self    External catheter Weight: 90.5 kg Height:  5\' 5"  (165.1 cm)  BEHAVIORAL SYMPTOMS/MOOD NEUROLOGICAL BOWEL NUTRITION STATUS  Other (Comment) (n/a)  (n/a) Continent Diet (Regular)  AMBULATORY STATUS COMMUNICATION OF NEEDS Skin   Limited Assist Verbally Other (Comment) (NPWT- Sacrum)                       Personal Care Assistance Level of Assistance  Bathing, Feeding, Dressing Bathing Assistance: Limited assistance Feeding assistance: Limited assistance Dressing Assistance: Limited assistance Total Care Assistance: Limited assistance   Functional Limitations Info    Sight Info: Adequate Hearing Info: Adequate Speech Info: Adequate    SPECIAL CARE FACTORS FREQUENCY  Contractures Contractures Info: Not present    Additional Factors Info  Code Status Code Status Info: FULL Allergies Info: Penicillin G, Sulfa Antibiotics, Tiotropium, Zoloft (Sertraline Hcl)            Current Medications (12/12/2022):  This is the current hospital active medication list Current Facility-Administered Medications  Medication Dose Route Frequency Provider Last Rate Last Admin   acetaminophen (TYLENOL) tablet 650 mg  650 mg Oral Q6H PRN Agbata, Tochukwu, MD       Or   acetaminophen (TYLENOL) suppository 650 mg  650 mg Rectal Q6H PRN Agbata, Tochukwu, MD       acetaminophen (TYLENOL) tablet 500 mg  500 mg Oral Q8H PRN Agbata, Tochukwu, MD   500 mg at 12/10/22 2209   acidophilus (RISAQUAD) capsule 1 capsule  1 capsule Oral Q12H Agbata, Tochukwu, MD   1 capsule at 12/12/22 0948   albuterol (PROVENTIL) (2.5 MG/3ML) 0.083% nebulizer solution 2.5 mg  2.5 mg Inhalation Q6H PRN Agbata, Tochukwu, MD       apixaban (ELIQUIS) tablet 5 mg  5 mg Oral BID Agbata, Tochukwu, MD   5 mg at 12/12/22 0948   ascorbic acid (VITAMIN C) tablet 500 mg  500 mg Oral BID Lolita Patella B, MD   500 mg at 12/12/22 0948   ceFEPIme (MAXIPIME) 2 g in sodium chloride 0.9 % 100 mL IVPB  2 g Intravenous Q8H Judyann Munson, MD 200 mL/hr at 12/12/22 1306 2 g at 12/12/22 1306   Chlorhexidine Gluconate Cloth 2 % PADS 6 each  6 each Topical Daily Lolita Patella B, MD   6 each at 12/12/22 0950   cholecalciferol (VITAMIN D3) 25 MCG (1000 UNIT) tablet 5,000 Units  5,000 Units Oral Daily Agbata, Tochukwu, MD   5,000 Units at 12/12/22 0947   cyanocobalamin (VITAMIN B12) tablet 1,000 mcg  1,000 mcg Oral Daily Agbata, Tochukwu, MD   1,000 mcg at 12/12/22 0948   doxycycline (VIBRA-TABS) tablet 100 mg  100 mg Oral Q12H Aleda Grana, RPH   100 mg at 12/12/22 0948   feeding supplement (ENSURE ENLIVE / ENSURE PLUS) liquid 237 mL  237 mL Oral TID BM Sreenath, Sudheer B, MD   237 mL at 12/12/22 1307   gabapentin (NEURONTIN) capsule 100 mg  100 mg Oral Daily Foye Deer, RPH   100 mg at 12/12/22 0948   gabapentin (NEURONTIN) capsule 200 mg  200 mg Oral QHS Foye Deer, RPH   200 mg at 12/11/22 2248   loratadine  (CLARITIN) tablet 10 mg  10 mg Oral Daily Agbata, Tochukwu, MD   10 mg at 12/12/22 0948   melatonin tablet 5 mg  5 mg Oral QHS PRN Agbata, Tochukwu, MD       metroNIDAZOLE (FLAGYL) tablet 500 mg  500 mg Oral Q12H Aleda Grana, RPH   500 mg at 12/12/22 0948   midodrine (PROAMATINE) tablet 10 mg  10 mg Oral TID WC Sreenath, Sudheer B, MD   10 mg at 12/12/22 1306   mirtazapine (REMERON) tablet 7.5 mg  7.5 mg Oral QHS Agbata, Tochukwu, MD   7.5 mg at 12/11/22 2247   mometasone-formoterol (DULERA) 100-5 MCG/ACT inhaler 2 puff  2 puff Inhalation BID Agbata, Tochukwu, MD   2 puff at 12/12/22 0949   morphine (PF) 2 MG/ML injection 2 mg  2 mg Intravenous Q3H PRN Lolita Patella B, MD   2 mg at 12/11/22 1219   multivitamin with minerals tablet 1 tablet  1  tablet Oral Daily Agbata, Tochukwu, MD   1 tablet at 12/11/22 0805   nitroGLYCERIN (NITROSTAT) SL tablet 0.4 mg  0.4 mg Sublingual Q5 min PRN Agbata, Tochukwu, MD       ondansetron (ZOFRAN) tablet 4 mg  4 mg Oral Q6H PRN Agbata, Tochukwu, MD       Or   ondansetron (ZOFRAN) injection 4 mg  4 mg Intravenous Q6H PRN Agbata, Tochukwu, MD       Oral care mouth rinse  15 mL Mouth Rinse 4 times per day Lolita Patella B, MD   15 mL at 12/12/22 1307   Oral care mouth rinse  15 mL Mouth Rinse PRN Sreenath, Sudheer B, MD       oxyCODONE (Oxy IR/ROXICODONE) immediate release tablet 5-10 mg  5-10 mg Oral Q4H PRN Lolita Patella B, MD   10 mg at 12/12/22 0258   pantoprazole (PROTONIX) EC tablet 40 mg  40 mg Oral Daily Agbata, Tochukwu, MD   40 mg at 12/12/22 0948   risperiDONE (RISPERDAL) tablet 1.5 mg  1.5 mg Oral QHS Agbata, Tochukwu, MD   1.5 mg at 12/11/22 2247   senna (SENOKOT) tablet 17.2 mg  2 tablet Oral q AM Agbata, Tochukwu, MD   17.2 mg at 12/12/22 0948   simvastatin (ZOCOR) tablet 20 mg  20 mg Oral QHS Agbata, Tochukwu, MD   20 mg at 12/11/22 2248   sodium chloride flush (NS) 0.9 % injection 10-40 mL  10-40 mL Intracatheter Q12H Sreenath, Sudheer  B, MD   10 mL at 12/12/22 0949   sodium chloride flush (NS) 0.9 % injection 10-40 mL  10-40 mL Intracatheter PRN Lolita Patella B, MD       zinc sulfate capsule 220 mg  220 mg Oral Daily Lolita Patella B, MD   220 mg at 12/11/22 1801     Discharge Medications: Please see discharge summary for a list of discharge medications.  Relevant Imaging Results:  Relevant Lab Results:   Additional Information SS#: [id number]  Truddie Hidden, RN

## 2022-12-12 NOTE — TOC Transition Note (Addendum)
Transition of Care [facility]) - CM/SW Discharge Note   Patient Details  Name: Evan [name] MRN: [id number] Date of Birth: 1956/04/27  [facility] of [facility]) CM/SW Contact:  Truddie Hidden, RN Phone Number: 12/12/2022, 2:26 PM   Clinical Narrative:    Sherron Monday with Deborah in admissions. Per facility patient admissions confirm for today. Patient assigned room # 223-A Nurse will call report to [phone] Nurse, and patient notified.  Face sheet and medical necessity forms printed to the floor to be added to the EMS pack EMS arranged  Discharge summary and SNF tranfer report sent in HUB.   Patient had questions about if his hospital bill has been paid. Patient has a DSS guardian. He was advised his guardian would follow up with him about his financial questions as they pay his bills.   Attempt to reach [employer], Sharlyne Cai and [employer] from DSS to advise of discharge back to [facility] today. No answer at any number attempted. FL2 and notice of discharge emailed to Sharlyne Cai and [employer].   TOC signing off.  Final next level of care: Skilled Nursing Facility Barriers to Discharge: Barriers Resolved   Patient Goals and CMS Choice      Discharge Placement                Patient chooses bed at: [facility] Patient to be transferred to facility by: EMS Name of family member notified: Messages left for [employer], Sharlyne Cai, and [employer] Patient and family notified of of transfer: 12/12/22  Discharge Plan and Services Additional resources added to the After Visit Summary for                                       Social Determinants of Health (SDOH) Interventions SDOH Screenings   Food Insecurity: No Food Insecurity (09/26/2022)  Housing: Low Risk  (09/26/2022)  Transportation Needs: No Transportation Needs (09/26/2022)  Utilities: Not At Risk (09/26/2022)  Alcohol Screen: Low Risk  (12/13/2021)  Depression (PHQ2-9): Medium  Risk (10/12/2021)  Financial Resource Strain: Low Risk  (08/22/2021)  Physical Activity: Sufficiently Active (02/25/2021)  Social Connections: Moderately Integrated (05/03/2021)  Stress: No Stress Concern Present (04/14/2021)  Tobacco Use: Low Risk  (11/26/2022)     Readmission Risk Interventions     No data to display

## 2022-12-13 ENCOUNTER — Inpatient Hospital Stay (HOSPITAL_COMMUNITY)
Admit: 2022-12-13 | Discharge: 2022-12-13 | Disposition: A | Discharge status: Home / Self Care | Payer: Medicare Other | Attending: Internal Medicine | Admitting: Internal Medicine

## 2022-12-13 ENCOUNTER — Encounter: Payer: 59 | Admitting: Surgery

## 2022-12-13 DIAGNOSIS — I4891 Unspecified atrial fibrillation: Secondary | ICD-10-CM | POA: Diagnosis not present

## 2022-12-13 DIAGNOSIS — A419 Sepsis, unspecified organism: Secondary | ICD-10-CM | POA: Diagnosis not present

## 2022-12-13 LAB — ECHOCARDIOGRAM COMPLETE
AR max vel: 0.68 cm2
AV Area VTI: 0.72 cm2
AV Area mean vel: 0.72 cm2
AV Mean grad: 22.7 mmHg
AV Peak grad: 37 mmHg
Ao pk vel: 3.04 m/s
Area-P 1/2: 5.23 cm2
Height: 65 in
MV M vel: 5.22 m/s
MV Peak grad: 109 mmHg
MV VTI: 2.02 cm2
Radius: 0.6 cm
S' Lateral: 3.8 cm
Weight: 3368.63 oz

## 2022-12-13 LAB — CULTURE, BLOOD (ROUTINE X 2)

## 2022-12-13 LAB — GLUCOSE, CAPILLARY: Glucose-Capillary: 107 mg/dL — ABNORMAL HIGH (ref 70–99)

## 2022-12-13 MED ORDER — DILTIAZEM HCL ER COATED BEADS 120 MG PO CP24
120.0000 mg | ORAL_CAPSULE | Freq: Every day | ORAL | Status: DC
  Administered 2022-12-13: 120 mg via ORAL
  Filled 2022-12-13: qty 1

## 2022-12-13 MED ORDER — DILTIAZEM HCL ER COATED BEADS 120 MG PO CP24: 120.0000 mg | ORAL_CAPSULE | Freq: Every day | ORAL | 1 refills | Status: DC

## 2022-12-13 NOTE — Progress Notes (Signed)
Pt reports feeling short of breath; denies CP. Respirations unlabored but expiratory wheezes heard. Albuterol nebulizer given and wheezing improved. Pt with congested cough. Assisted to use incentive spirometer (achieving ) and flutter valve. Pt with productive cough and reports feeling better then went to sleep.

## 2022-12-13 NOTE — Progress Notes (Signed)
*  PRELIMINARY RESULTS* Echocardiogram 2D Echocardiogram has been performed.  Cristela Blue 12/13/2022, 8:44 AM

## 2022-12-13 NOTE — Progress Notes (Signed)
12/13/22  Wound vac dressing was changed by Ms. Mayo this morning.  We were not available at the time to evaluate the wound, but a picture was taken.  The wound bed looks healthy with good granulation tissue, no necrosis or purulence.  No need for debridement at this point, and it's unlikely the source of septic presentation on admission.  OK to discharge from the wound standpoint. Continue wound vac dressing change on MWF schedule.  Follow up in 3 weeks.  Henrene Dodge, MD

## 2022-12-13 NOTE — Progress Notes (Signed)
Progress Note   Patient: Evan [name]:[id number] DOB: [birth year] DOA: 12/08/2022     5 DOS: the patient was seen and examined on 12/13/2022   Brief hospital course: 67 y.o. male with medical history significant for paranoid schizophrenia, coronary artery disease, non-insulin-dependent diabetes mellitus with complications of diabetic retinopathy, hypertension, obesity who was recently discharged from the hospital following treatment for an infected sacral decubitus ulcer.  He was discharged to the skilled nursing facility with a wound VAC and on antibiotic therapy to treat sacral osteomyelitis. Patient was sent to the emergency room from the skilled nursing facility for evaluation of weakness and hypotension.  He was noted to have systolic blood pressure in the 80s.  He had a Tmax of 100.4 and was tachycardic with heart rate of 120.   CT scan with decubitus ulcer level of sacrum     Assessment and Plan: Principal Problem:   Sepsis (HCC) Active Problems:   Sacral osteomyelitis (HCC)   COPD, mild (HCC)   Persistent atrial fibrillation (HCC)   CORONARY ATHEROSCLEROSIS, ARTERY BYPASS GRAFT   Diabetes mellitus (HCC)   Paranoid schizophrenia (HCC)   * Sepsis (HCC) Evidenced by low-grade fever with a Tmax of 99.7, tachycardia, tachypnea and hypotension with systolic blood pressure in the 80s that responded to IV fluid resuscitation Patient also has leukocytosis and a downtrending lactic acid level Source of sepsis appeared to be sacral osteomyelitis Patient is status post recent debridement of his sacral decubitus ulcer.  Gram stain yielded few gram-negative rods Infectious disease consulted.  Antibiotics switched to cefepime, doxycycline and Flagyl.   PICC line in place for 4-week course of IV antibiotic therapy     Sacral osteomyelitis [facility]) Patient was hospitalized from 4/19 - 4/25 for management of an infected decubitus ulcer He is status postdebridement and had a bone biopsy  which was consistent with acute osteomyelitis WOC consult appreciated.  Surgical team made aware.  Local wound care.  Offloading mattress.     COPD, mild (HCC) Continue as needed bronchodilator therapy as well as inhaled steroids.   Persistent atrial fibrillation (HCC) Continue Eliquis for secondary prophylaxis for an acute stroke Added Cardizem CD 120 mg daily for rate control   Paranoid schizophrenia (HCC) Continue risperidone and mirtazapine   Diabetes mellitus (HCC) Last hemoglobin A1c was 6 Maintain consistent carbohydrate diet Check blood sugars AC meals   CORONARY ATHEROSCLEROSIS, ARTERY BYPASS GRAFT Continue simvastatin      Subjective: Patient was scheduled for discharge home on 12/12/22 but this was held since patient went into rapid A-fib.  He was observed overnight and has had no further issues.  Cardizem CD has been added to his medications  Physical Exam: Vitals:   12/13/22 0412 12/13/22 0416 12/13/22 0508 12/13/22 0753  BP:  92/61 92/66 100/70  Pulse:  96  95  Resp:  20 17 16   Temp:  98.8 F (37.1 C)  98.3 F (36.8 C)  TempSrc:      SpO2:  98% 97% 100%  Weight: 95.5 kg     Height:       Physical Exam Vitals and nursing note reviewed.  Constitutional:      Appearance: He is obese.     Comments: Chronically ill-appearing  HENT:     Head: Normocephalic and atraumatic.     Nose: Nose normal.     Mouth/Throat:     Mouth: Mucous membranes are moist.  Eyes:     Conjunctiva/sclera: Conjunctivae normal.  Cardiovascular:  Rate and Rhythm: Rhythm irregular.  Pulmonary:     Effort: Pulmonary effort is normal.     Breath sounds: Normal breath sounds.  Abdominal:     General: Bowel sounds are normal.     Palpations: Abdomen is soft.     Comments: Central adiposity   Musculoskeletal:        General: Normal range of motion.     Cervical back: Normal range of motion and neck supple.  Skin:    Comments: Wound VAC in place with protective dressing   Neurological:     Motor: Weakness present.  Psychiatric:        Mood and Affect: Mood normal.        Behavior: Behavior normal.     Data Reviewed:  There are no new results to review at this time.  Family Communication: Called and spoke to patient's guardian Freida Busman - Lovena Le to notify her of patient's discharge to skilled nursing facility.  Disposition: Status is: Inpatient Remains inpatient appropriate because: Will be discharged to skilled nursing facility today  Planned Discharge Destination: Skilled nursing facility    Time spent: 30 minutes  Author: Lucile Shutters, MD 12/13/2022 12:48 PM  For on call review [email or web address].

## 2022-12-13 NOTE — Progress Notes (Signed)
*  PRELIMINARY RESULTS* Echocardiogram 2D Echocardiogram has been performed.  Cristela Blue 12/13/2022, 7:47 AM

## 2022-12-13 NOTE — Consult Note (Signed)
WOC Nurse wound follow up Patient is premedicated for pain prior to procedure Wound type: Stage 4 sacral wound, tunneling at 7 o'clock.  Photo in chart today Measurement: 8 cm x 9 cm x 4.5 cm  tunneling at 7 o;clock, extends 4 cm  Wound bed: pale pink, nongranulating.  Photo in chart Drainage (amount, consistency, odor) moderate serosanguinous in canister Periwound: Rectum in close proximity.  Protected dressing from stool insult with barrier ring.  Dressing procedure/placement/frequency:Black foam to wound bed, bridged to left trochanter to offload pressure.  Seal achieved at 125 mmhg   Change Mon.Wed.Fri. Should discharge back to SNF today.  Bedside RN to disconnect VAC dressing from machine and sent back to facility with dressing in place  (transport less than one hour) DO NOT SEND VAC MACHINE BACK TO FACILITY WITH PATIENT.   Will not follow at this time.  Please re-consult if needed.  Mike Gip MSN, RN, FNP-BC CWON Wound, Ostomy, Continence Nurse Outpatient [facility] [phone] Pager [phone]

## 2022-12-13 NOTE — Progress Notes (Signed)
Pt left via EMS back to [facility]. Picc heparin locked, foley emptied, and wound vac clamped and disconnected. Report given to [facility] RN

## 2023-01-02 ENCOUNTER — Inpatient Hospital Stay: Payer: 59 | Admitting: Infectious Diseases

## 2023-01-05 ENCOUNTER — Encounter: Payer: Self-pay | Admitting: Surgery

## 2023-01-05 ENCOUNTER — Ambulatory Visit (INDEPENDENT_AMBULATORY_CARE_PROVIDER_SITE_OTHER): Payer: Medicare Other | Admitting: Surgery

## 2023-01-05 VITALS — BP 126/67 | HR 98 | Temp 97.4°F | Ht 65.0 in | Wt 175.8 lb

## 2023-01-05 DIAGNOSIS — Z09 Encounter for follow-up examination after completed treatment for conditions other than malignant neoplasm: Secondary | ICD-10-CM | POA: Diagnosis not present

## 2023-01-05 DIAGNOSIS — L89154 Pressure ulcer of sacral region, stage 4: Secondary | ICD-10-CM | POA: Diagnosis not present

## 2023-01-05 NOTE — Progress Notes (Addendum)
01/05/2023  HPI: TARAJ YOFFE is a 67 y.o. male s/p debridement of stage IV sacral decubitus wound on 11/25/2022 with wound VAC placement.  Patient presents today for follow-up.  He had been readmitted on 12/08/2022 with sepsis was not related to his wound.  Today, the patient reports that he still having some pain in the sacral area.  He was brought in by EMS but otherwise there is nobody else with him.  Vital signs: BP 126/67   Pulse 98   Temp (!) 97.4 F (36.3 C) (Oral)   Ht 5\' 5"  (1.651 m)   Wt 175 lb 12.8 oz (79.7 kg)   SpO2 98%   BMI 29.25 kg/m    Physical Exam: Constitutional: No acute distress Skin: The patient's sacral wound is healing appropriately with healthy granulation tissue and decrease in size and depth.  Previous tunneling seen at 7:00 is no longer visible.  The patient is now coming in with wound VAC supplies and show wound VAC dressing was removed and only a wet-to-dry dressing was applied.  Assessment/Plan: This is a 67 y.o. male s/p debridement of stage IV sacral decubitus wound.  - Patient's wound continues to heal appropriately without any evidence of further necrosis or need for further debridement.  There was no stool contamination inside the wound itself.  No ostomy is needed. - Continue wound VAC dressing changes.  I contacted [facility] prior to removing the wound VAC dressing and they will be able to reapply a new wound VAC dressing when he returns to their facility.  Continue with these dressings on a Monday Wednesday Friday basis. - Follow-up in 1 month.   Howie Ill, MD [facility]

## 2023-01-05 NOTE — Patient Instructions (Signed)
Negative Pressure Wound Therapy Home Guide Negative pressure wound therapy (NPWT) uses a sponge or foam-like material (dressing) placed on or inside the wound. The wound is then covered and sealed with a cover dressing that sticks to your skin (is adhesive) to keep air out. A tube is attached to the cover dressing, and this tube connects to a small pump. The pump removes drainage from the wound. NPWT helps to increase blood flow to the wound and heal it from the inside. NPWT also helps pull the edges of the wound together and removes fluids and germs from the wound. NPWT may also be called wound vac. What are the risks? NPWT is usually safe to use. However, problems can occur, including: Skin irritation from the dressing adhesive. Bleeding. Infection. Signs of infection include: More redness, swelling, or pain. More fluid or blood. Warmth or hardness around the wound. Pus or a bad smell. Red streaks leading from the wound. Dehydration. Wounds with large amounts of drainage can cause excessive body fluid loss. Pain. Supplies needed: Wound cleanser or salt-water solution (saline). Skin protectant. This may be a wipe, film, or spray. Clean or germ-free (sterile) scissors. New sponge or foam. Cover dressing. Gauze pad. Tape. Also have available: Soap and water, or hand sanitizer. Disposable gloves. Eye protection. A disposable garbage bag. Protective clothing. How to change your dressing Change the dressing as scheduled, if the dressing loses suction, or the pump is off for 2 hours or more. Prepare to change your dressing  Take pain medicine 30 minutes before changing the dressing if told by your health care provider. Wash your hands with soap and water for at least 20 seconds before you change the dressing. If soap and water are not available, use hand sanitizer Dry your hands with a clean towel. Set up a clean station for wound care and set a plastic bag on or near your work surface  for trash. Open the dressing package so that the sponge dressing remains on the inside of the package. Wear gloves, protective clothing, and eye protection. Remove the old dressing  Turn off the pump and disconnect the tubing from the dressing. Carefully remove the adhesive cover dressing in the direction of your hair growth. Remove the sponge dressing that is inside the wound. If the sponge sticks, use a wound cleanser or saline solution to wet the sponge and help it come off more easily. Throw the old sponge and cover dressing supplies into the garbage bag. Check the wound for signs of infection, including a bad smell, drainage (bleeding or pus), or new color changes (black, grey, yellow, or white) in the wound. Remove your gloves by grabbing the cuff and turning the glove inside out. Place the gloves in the trash immediately. Wash your hands with soap and water for at least 20 seconds. If soap and water are not available, use hand sanitizer. Dry your hands with a clean towel. Clean your wound Wear gloves, protective clothing, and eye protection. Follow your health care provider's instructions on how to clean your wound. You may be told to: Clean the wound using a saline solution or a wound cleanser and a clean gauze pad. Pat the wound dry with a gauze pad. Do not rub the wound. Throw the gauze pad into the garbage bag. Remove your gloves by grabbing the cuff and turning the glove inside out. Place the gloves in the trash immediately. Wash your hands with soap and water for at least 20 seconds. If soap and  water are not available, use hand sanitizer Dry your hands with a clean towel. Apply the new dressing Wear gloves, protective clothing, and eye protection. If told by your health care provider, apply a skin protectant to any skin that will be exposed to adhesive. Let the skin protectant dry. Cut a piece of new sponge dressing and put it on or in the wound. Using clean scissors, cut a  nickel-sized hole in the new cover dressing. Apply the cover dressing. Attach the suction tube over the hole in the cover dressing. Take off your gloves. Put them in the plastic bag with the old dressing. Tie the bag shut and throw it away. Wash your hands with soap and water for at least 20 seconds. If soap and water are not available, use hand sanitizer. Dry your hands with a clean towel. Turn the pump back on. The sponge dressing should collapse. Do not change the settings on the machine without talking to a health care provider. Replace the container in the pump that collects fluid if it is full. Replace the container per the manufacturer's instructions or at least once a week, even if it is not full. Follow your health care provider'sinstructions on how often you need to change and apply the new NPWT dressing. General tips and recommendations If the alarm sounds: Stay calm and prepare to troubleshoot. Do not turn off the pump or do anything with the dressing. The alarm may go off for many reasons, including: The battery is low. Change the battery or plug the device into electrical power. The dressing has a leak. Find the leak and put tape over the leak. The fluid collection container is full. Change the fluid container. Call your health care provider right away if you cannot fix the problem. Explain to your health care provider what is happening. Follow his or her instructions. General instructions Change the dressing as scheduled, if the dressing loses suction, or the pump is off for 2 hours or more. Do not turn off the pump unless told to do so by your health care provider. Do not turn off the pump for more than 2 hours. If the pump is off or you lose suction to the dressing for more than 2 hours, the dressing will need to be changed. Check the machine frequently to make sure that therapy is on and that all clamps are open. Do not use over-the-counter medicated or antiseptic creams,  sprays, liquids, or dressings unless told by your health care provider. Do not take baths, swim, or use a hot tub until your health care provider approves. You may only be allowed to take sponge baths. Ask your health care provider if you may take showers. If your health care provider says it is okay to shower: Do not take the pump into the shower. Make sure the wound dressing is protected and sealed. The wound dressing must stay dry. Contact a health care provider if: You have new pain. You develop irritation, a rash, or itching around the wound or dressing. You see new color changes (black, grey, yellow, or white) in the wound. The dressing changes are painful or cause bleeding. The pump has been off for more than 2 hours, and you do not know how to change the dressing. The pump alarm goes off, and you do not know what to do. Get help right away if: You have a lot of bleeding. The wound breaks open. You have severe pain. You have signs of infection, such as:  More redness, swelling, or pain. More fluid or blood. Warmth or hardness. Pus or a bad smell. Red streaks leading from the wound. A fever. You see a sudden change in the color or texture of the drainage. You have signs of dehydration, such as: Little or no tears, urine, or sweat. Muscle cramps. Very dry mouth. Headache. Dizziness or confusion. Summary NPWT uses a sponge or dressing placed on or inside the wound. NPWT helps to increase blood flow to the wound and heal it from the inside. Follow your health care provider's instructions on how to clean your wound and how to change the dressing. Contact a health care provider if you have new pain, an irritation, or a rash, or if the alarm goes off and you do not know what to do. Get help right away if you have a lot of bleeding, your wound breaks open, or you have severe pain. Also, get help if you have signs of infection. This information is not intended to replace advice given  to you by your health care provider. Make sure you discuss any questions you have with your health care provider. Document Revised: 12/20/2020 Document Reviewed: 12/20/2020 Elsevier Patient Education  2024 [employer].

## 2023-01-09 ENCOUNTER — Ambulatory Visit: Payer: Medicare Other | Attending: Infectious Diseases | Admitting: Infectious Diseases

## 2023-01-09 DIAGNOSIS — L89154 Pressure ulcer of sacral region, stage 4: Secondary | ICD-10-CM | POA: Diagnosis present

## 2023-01-09 NOTE — Progress Notes (Signed)
The purpose of this virtual visit is to provide medical care while limiting exposure to the novel coronavirus (COVID19) for both patient and office staff.   Consent was obtained for video visit:  Yes.   Answered questions that patient had about telehealth interaction:  Yes.   I discussed the limitations, risks, security and privacy concerns of performing an evaluation and management service by telephone. I also discussed with the patient that there may be a patient responsible charge related to this service. The patient expressed understanding and agreed to proceed.   Patient Location: [facility] (medical) People on the call- Dr.Evin Chirco, Dr.Black wound Research scientist (medical) at [employer], Engineer, agricultural at [employer] 67 yr male with h/o DM< paranoid schizophrenia, Afib, COPD, sacral decubitus with underling osteomyleitis Pt was recently in [facility] for infected sacral decubitus and had debridement and vac placement He was seen by Dr.Snider ID and discharged on Iv cefepime, PO doxy 100mg  BID and PO flagyl 500mg  BID  End date is 01/19/2023  As per staff at the right [facility] manage he has been getting his IV antibiotic and oral antibiotic regularly I examined the wound virtually Dr. Vedia Coffer the wound consultant was also present during this visit He mentioned that the wound was stage IV and bone could be palpable He said the wound was looking stable since last week   Sacral decubitus stage IV Continue cefepime IV 2 g every 8 Doxycycline 100 mg p.o. twice daily and metronidazole 5 mg p.o. twice daily until 01/19/2023 Need weekly labs of CBC CMP CRP and ESR to be faxed to me Dr. Vedia Coffer will send weekly pictures. I will communicate with him regarding the progression of the wound Very likely after IV cefepime he may go on p.o. cefadroxil +/_doxycycline and metronidazole  Discussed the management with the patient as well Follow-up virtually PRN Total time spent on this video visit :seeing patient   coordinating care and preparing for the visit , chart review was 20 minutes

## 2023-02-05 ENCOUNTER — Encounter: Payer: Medicare Other | Admitting: Surgery

## 2023-02-13 ENCOUNTER — Other Ambulatory Visit: Payer: Self-pay | Admitting: Infectious Diseases

## 2023-03-08 ENCOUNTER — Ambulatory Visit: Payer: Medicare Other | Admitting: Urology

## 2023-03-08 ENCOUNTER — Telehealth: Payer: Self-pay | Admitting: *Deleted

## 2023-03-08 NOTE — Telephone Encounter (Signed)
Pt was sent over from [facility] on a stretcher. Pt has legal guardian which is the state per [facility]. Pt can not sign for himself and was very lethargic. Per Dr. Lonna Cobb we would need clearance from the wound dr before we could remove the catheter. Pt has a sacral decubitus ulcer and with the catheter removed it could start the infection again.   Spoke with [facility] and they state he has e-coli in his urine. I explained that the urine would need to taken from a clean catheter and not the bag. [facility] will call back to reschedule.

## 2023-08-08 DEATH — deceased
